# Patient Record
Sex: Female | Born: 1945 | Race: White | Hispanic: No | Marital: Married | Smoking: Never smoker
Health system: Southern US, Community
[De-identification: ages and names within clinical notes are randomized; demographics above are authoritative.]

## PROBLEM LIST (undated history)

## (undated) DIAGNOSIS — N6019 Diffuse cystic mastopathy of unspecified breast: Secondary | ICD-10-CM

## (undated) DIAGNOSIS — Z8701 Personal history of pneumonia (recurrent): Secondary | ICD-10-CM

## (undated) DIAGNOSIS — K589 Irritable bowel syndrome without diarrhea: Secondary | ICD-10-CM

## (undated) DIAGNOSIS — G43009 Migraine without aura, not intractable, without status migrainosus: Secondary | ICD-10-CM

## (undated) DIAGNOSIS — I428 Other cardiomyopathies: Secondary | ICD-10-CM

## (undated) DIAGNOSIS — J387 Other diseases of larynx: Secondary | ICD-10-CM

## (undated) DIAGNOSIS — T7840XA Allergy, unspecified, initial encounter: Secondary | ICD-10-CM

## (undated) DIAGNOSIS — I700 Atherosclerosis of aorta: Secondary | ICD-10-CM

## (undated) DIAGNOSIS — M797 Fibromyalgia: Secondary | ICD-10-CM

## (undated) DIAGNOSIS — J383 Other diseases of vocal cords: Secondary | ICD-10-CM

## (undated) DIAGNOSIS — K5740 Diverticulitis of both small and large intestine with perforation and abscess without bleeding: Secondary | ICD-10-CM

## (undated) DIAGNOSIS — I509 Heart failure, unspecified: Secondary | ICD-10-CM

## (undated) DIAGNOSIS — R112 Nausea with vomiting, unspecified: Secondary | ICD-10-CM

## (undated) DIAGNOSIS — J189 Pneumonia, unspecified organism: Secondary | ICD-10-CM

## (undated) DIAGNOSIS — Z8719 Personal history of other diseases of the digestive system: Secondary | ICD-10-CM

## (undated) DIAGNOSIS — R55 Syncope and collapse: Principal | ICD-10-CM

## (undated) DIAGNOSIS — D472 Monoclonal gammopathy: Secondary | ICD-10-CM

## (undated) DIAGNOSIS — R197 Diarrhea, unspecified: Secondary | ICD-10-CM

## (undated) DIAGNOSIS — Z95810 Presence of automatic (implantable) cardiac defibrillator: Secondary | ICD-10-CM

## (undated) DIAGNOSIS — K649 Unspecified hemorrhoids: Secondary | ICD-10-CM

## (undated) DIAGNOSIS — F419 Anxiety disorder, unspecified: Secondary | ICD-10-CM

## (undated) DIAGNOSIS — I340 Nonrheumatic mitral (valve) insufficiency: Secondary | ICD-10-CM

## (undated) DIAGNOSIS — M1990 Unspecified osteoarthritis, unspecified site: Secondary | ICD-10-CM

## (undated) DIAGNOSIS — F329 Major depressive disorder, single episode, unspecified: Secondary | ICD-10-CM

## (undated) DIAGNOSIS — I5020 Unspecified systolic (congestive) heart failure: Secondary | ICD-10-CM

## (undated) DIAGNOSIS — J449 Chronic obstructive pulmonary disease, unspecified: Secondary | ICD-10-CM

## (undated) DIAGNOSIS — Z8489 Family history of other specified conditions: Secondary | ICD-10-CM

## (undated) DIAGNOSIS — F32A Depression, unspecified: Secondary | ICD-10-CM

## (undated) DIAGNOSIS — D649 Anemia, unspecified: Secondary | ICD-10-CM

## (undated) DIAGNOSIS — J329 Chronic sinusitis, unspecified: Secondary | ICD-10-CM

## (undated) DIAGNOSIS — I10 Essential (primary) hypertension: Secondary | ICD-10-CM

## (undated) DIAGNOSIS — J984 Other disorders of lung: Secondary | ICD-10-CM

## (undated) DIAGNOSIS — G629 Polyneuropathy, unspecified: Secondary | ICD-10-CM

## (undated) DIAGNOSIS — G4730 Sleep apnea, unspecified: Secondary | ICD-10-CM

## (undated) DIAGNOSIS — G4700 Insomnia, unspecified: Secondary | ICD-10-CM

## (undated) DIAGNOSIS — Z9189 Other specified personal risk factors, not elsewhere classified: Secondary | ICD-10-CM

## (undated) DIAGNOSIS — K219 Gastro-esophageal reflux disease without esophagitis: Secondary | ICD-10-CM

## (undated) DIAGNOSIS — Z9889 Other specified postprocedural states: Secondary | ICD-10-CM

## (undated) DIAGNOSIS — M810 Age-related osteoporosis without current pathological fracture: Secondary | ICD-10-CM

## (undated) DIAGNOSIS — R0600 Dyspnea, unspecified: Secondary | ICD-10-CM

## (undated) DIAGNOSIS — C801 Malignant (primary) neoplasm, unspecified: Secondary | ICD-10-CM

## (undated) HISTORY — DX: Migraine without aura, not intractable, without status migrainosus: G43.009

## (undated) HISTORY — DX: Chronic sinusitis, unspecified: J32.9

## (undated) HISTORY — PX: APPENDECTOMY: SHX54

## (undated) HISTORY — DX: Diarrhea, unspecified: R19.7

## (undated) HISTORY — PX: TOE AMPUTATION: SHX809

## (undated) HISTORY — PX: AUGMENTATION MAMMAPLASTY: SUR837

## (undated) HISTORY — DX: Other diseases of larynx: J38.7

## (undated) HISTORY — DX: Allergy, unspecified, initial encounter: T78.40XA

## (undated) HISTORY — PX: OTHER SURGICAL HISTORY: SHX169

## (undated) HISTORY — DX: Atherosclerosis of aorta: I70.0

## (undated) HISTORY — PX: OSTEOTOMY: SHX137

## (undated) HISTORY — DX: Anxiety disorder, unspecified: F41.9

## (undated) HISTORY — DX: Other diseases of vocal cords: J38.3

## (undated) HISTORY — DX: Diffuse cystic mastopathy of unspecified breast: N60.19

## (undated) HISTORY — DX: Gastro-esophageal reflux disease without esophagitis: K21.9

## (undated) HISTORY — DX: Personal history of pneumonia (recurrent): Z87.01

## (undated) HISTORY — DX: Monoclonal gammopathy: D47.2

## (undated) HISTORY — PX: MANDIBLE SURGERY: SHX707

## (undated) HISTORY — DX: Essential (primary) hypertension: I10

## (undated) HISTORY — DX: Irritable bowel syndrome, unspecified: K58.9

## (undated) HISTORY — DX: Other cardiomyopathies: I42.8

## (undated) HISTORY — PX: LASIK: SHX215

## (undated) HISTORY — DX: Unspecified hemorrhoids: K64.9

## (undated) HISTORY — DX: Fibromyalgia: M79.7

## (undated) HISTORY — DX: Pneumonia, unspecified organism: J18.9

## (undated) HISTORY — DX: Age-related osteoporosis without current pathological fracture: M81.0

## (undated) HISTORY — DX: Syncope and collapse: R55

## (undated) HISTORY — DX: Insomnia, unspecified: G47.00

## (undated) HISTORY — PX: KNEE ARTHROSCOPY W/ ORIF: SUR98

## (undated) HISTORY — DX: Chronic obstructive pulmonary disease, unspecified: J44.9

## (undated) HISTORY — DX: Personal history of other diseases of the digestive system: Z87.19

## (undated) HISTORY — DX: Unspecified systolic (congestive) heart failure: I50.20

## (undated) HISTORY — DX: Nonrheumatic mitral (valve) insufficiency: I34.0

## (undated) HISTORY — DX: Unspecified osteoarthritis, unspecified site: M19.90

## (undated) HISTORY — PX: TONSILLECTOMY AND ADENOIDECTOMY: SUR1326

---

## 1985-05-15 HISTORY — PX: ABDOMINAL HYSTERECTOMY: SHX81

## 1998-08-13 ENCOUNTER — Other Ambulatory Visit: Admission: RE | Admit: 1998-08-13 | Discharge: 1998-08-13 | Payer: Self-pay | Admitting: *Deleted

## 1999-03-28 ENCOUNTER — Encounter: Payer: Self-pay | Admitting: Family Medicine

## 1999-03-28 ENCOUNTER — Encounter: Admission: RE | Admit: 1999-03-28 | Discharge: 1999-03-28 | Payer: Self-pay | Admitting: Family Medicine

## 1999-08-04 ENCOUNTER — Other Ambulatory Visit: Admission: RE | Admit: 1999-08-04 | Discharge: 1999-08-04 | Payer: Self-pay | Admitting: Obstetrics and Gynecology

## 2000-10-23 ENCOUNTER — Encounter: Admission: RE | Admit: 2000-10-23 | Discharge: 2000-10-23 | Payer: Self-pay | Admitting: Internal Medicine

## 2001-08-13 ENCOUNTER — Other Ambulatory Visit: Admission: RE | Admit: 2001-08-13 | Discharge: 2001-08-13 | Payer: Self-pay | Admitting: Obstetrics and Gynecology

## 2001-10-16 HISTORY — PX: OTHER SURGICAL HISTORY: SHX169

## 2001-11-21 ENCOUNTER — Ambulatory Visit (HOSPITAL_COMMUNITY): Admission: RE | Admit: 2001-11-21 | Discharge: 2001-11-21 | Payer: Self-pay | Admitting: Family Medicine

## 2001-11-21 ENCOUNTER — Encounter: Payer: Self-pay | Admitting: Family Medicine

## 2004-05-12 ENCOUNTER — Emergency Department: Payer: Self-pay | Admitting: Emergency Medicine

## 2004-05-19 ENCOUNTER — Ambulatory Visit: Payer: Self-pay | Admitting: Internal Medicine

## 2004-05-20 ENCOUNTER — Ambulatory Visit: Payer: Self-pay | Admitting: Surgery

## 2004-07-20 ENCOUNTER — Ambulatory Visit: Payer: Self-pay | Admitting: Family Medicine

## 2004-07-25 ENCOUNTER — Ambulatory Visit: Payer: Self-pay | Admitting: Family Medicine

## 2004-09-10 ENCOUNTER — Emergency Department: Payer: Self-pay | Admitting: Internal Medicine

## 2004-09-14 ENCOUNTER — Ambulatory Visit: Payer: Self-pay | Admitting: Family Medicine

## 2005-02-20 ENCOUNTER — Ambulatory Visit: Payer: Self-pay | Admitting: Family Medicine

## 2005-02-27 ENCOUNTER — Ambulatory Visit: Payer: Self-pay | Admitting: Family Medicine

## 2005-03-20 ENCOUNTER — Ambulatory Visit: Payer: Self-pay | Admitting: Internal Medicine

## 2005-03-22 ENCOUNTER — Ambulatory Visit: Payer: Self-pay | Admitting: Internal Medicine

## 2005-06-20 ENCOUNTER — Ambulatory Visit: Payer: Self-pay | Admitting: Family Medicine

## 2005-07-04 ENCOUNTER — Ambulatory Visit: Payer: Self-pay | Admitting: Internal Medicine

## 2005-08-08 ENCOUNTER — Ambulatory Visit: Payer: Self-pay | Admitting: General Practice

## 2005-09-11 ENCOUNTER — Ambulatory Visit: Payer: Self-pay | Admitting: Family Medicine

## 2005-09-22 ENCOUNTER — Ambulatory Visit: Payer: Self-pay | Admitting: Family Medicine

## 2005-11-12 ENCOUNTER — Encounter: Payer: Self-pay | Admitting: Family Medicine

## 2005-11-12 LAB — CONVERTED CEMR LAB: Pap Smear: NORMAL

## 2005-11-16 ENCOUNTER — Ambulatory Visit: Payer: Self-pay | Admitting: Family Medicine

## 2005-11-30 ENCOUNTER — Ambulatory Visit: Payer: Self-pay | Admitting: Family Medicine

## 2005-12-01 ENCOUNTER — Ambulatory Visit: Payer: Self-pay | Admitting: Family Medicine

## 2006-01-17 ENCOUNTER — Ambulatory Visit: Payer: Self-pay | Admitting: Family Medicine

## 2006-02-26 ENCOUNTER — Ambulatory Visit: Payer: Self-pay | Admitting: Family Medicine

## 2006-03-06 ENCOUNTER — Ambulatory Visit: Payer: Self-pay | Admitting: Family Medicine

## 2006-03-28 ENCOUNTER — Ambulatory Visit: Payer: Self-pay | Admitting: Family Medicine

## 2006-06-11 HISTORY — PX: OTHER SURGICAL HISTORY: SHX169

## 2006-11-22 ENCOUNTER — Ambulatory Visit: Payer: Self-pay | Admitting: Family Medicine

## 2006-11-22 DIAGNOSIS — R109 Unspecified abdominal pain: Secondary | ICD-10-CM | POA: Insufficient documentation

## 2006-11-22 DIAGNOSIS — M1990 Unspecified osteoarthritis, unspecified site: Secondary | ICD-10-CM | POA: Insufficient documentation

## 2006-11-22 DIAGNOSIS — E785 Hyperlipidemia, unspecified: Secondary | ICD-10-CM

## 2006-11-22 DIAGNOSIS — M797 Fibromyalgia: Secondary | ICD-10-CM | POA: Insufficient documentation

## 2006-11-22 DIAGNOSIS — K589 Irritable bowel syndrome without diarrhea: Secondary | ICD-10-CM | POA: Insufficient documentation

## 2006-11-22 DIAGNOSIS — G43009 Migraine without aura, not intractable, without status migrainosus: Secondary | ICD-10-CM | POA: Insufficient documentation

## 2006-11-22 DIAGNOSIS — N6019 Diffuse cystic mastopathy of unspecified breast: Secondary | ICD-10-CM | POA: Insufficient documentation

## 2006-11-26 ENCOUNTER — Ambulatory Visit: Payer: Self-pay | Admitting: Family Medicine

## 2006-12-18 ENCOUNTER — Encounter: Payer: Self-pay | Admitting: Family Medicine

## 2007-02-22 ENCOUNTER — Encounter: Payer: Self-pay | Admitting: Family Medicine

## 2007-04-30 ENCOUNTER — Encounter: Payer: Self-pay | Admitting: Family Medicine

## 2007-06-10 ENCOUNTER — Telehealth (INDEPENDENT_AMBULATORY_CARE_PROVIDER_SITE_OTHER): Payer: Self-pay | Admitting: *Deleted

## 2007-08-05 ENCOUNTER — Telehealth: Payer: Self-pay | Admitting: Family Medicine

## 2007-08-06 ENCOUNTER — Ambulatory Visit: Payer: Self-pay | Admitting: Family Medicine

## 2007-08-06 ENCOUNTER — Encounter (INDEPENDENT_AMBULATORY_CARE_PROVIDER_SITE_OTHER): Payer: Self-pay | Admitting: Internal Medicine

## 2007-08-12 ENCOUNTER — Ambulatory Visit: Payer: Self-pay | Admitting: Family Medicine

## 2007-08-12 DIAGNOSIS — N63 Unspecified lump in unspecified breast: Secondary | ICD-10-CM | POA: Insufficient documentation

## 2007-08-19 ENCOUNTER — Encounter (INDEPENDENT_AMBULATORY_CARE_PROVIDER_SITE_OTHER): Payer: Self-pay | Admitting: Internal Medicine

## 2007-08-19 LAB — CONVERTED CEMR LAB
Cholesterol: 160 mg/dL (ref 0–200)
HDL: 45.8 mg/dL (ref >39.0)
LDL Cholesterol: 88 mg/dL (ref 0–99)
Total CHOL/HDL Ratio: 3.5
Triglycerides: 129 mg/dL (ref 0–149)
VLDL: 26 mg/dL (ref 0–40)

## 2007-08-28 ENCOUNTER — Telehealth (INDEPENDENT_AMBULATORY_CARE_PROVIDER_SITE_OTHER): Payer: Self-pay | Admitting: Internal Medicine

## 2007-08-29 ENCOUNTER — Encounter (INDEPENDENT_AMBULATORY_CARE_PROVIDER_SITE_OTHER): Payer: Self-pay | Admitting: *Deleted

## 2007-08-29 ENCOUNTER — Encounter (INDEPENDENT_AMBULATORY_CARE_PROVIDER_SITE_OTHER): Payer: Self-pay | Admitting: Internal Medicine

## 2007-11-28 ENCOUNTER — Encounter (INDEPENDENT_AMBULATORY_CARE_PROVIDER_SITE_OTHER): Payer: Self-pay | Admitting: Internal Medicine

## 2007-12-04 ENCOUNTER — Encounter: Payer: Self-pay | Admitting: Family Medicine

## 2008-02-21 ENCOUNTER — Ambulatory Visit: Payer: Self-pay | Admitting: Family Medicine

## 2008-02-26 LAB — CONVERTED CEMR LAB
ALT: 28 units/L (ref 0–35)
AST: 27 units/L (ref 0–37)
Albumin: 3.9 g/dL (ref 3.5–5.2)
Alkaline Phosphatase: 89 units/L (ref 39–117)
Bilirubin, Direct: 0.1 mg/dL (ref 0.0–0.3)
Cholesterol: 153 mg/dL (ref 0–200)
HDL: 49.1 mg/dL (ref >39.0)
LDL Cholesterol: 84 mg/dL (ref 0–99)
Total Bilirubin: 0.7 mg/dL (ref 0.3–1.2)
Total CHOL/HDL Ratio: 3.1
Total Protein: 7.2 g/dL (ref 6.0–8.3)
Triglycerides: 98 mg/dL (ref 0–149)
VLDL: 20 mg/dL (ref 0–40)

## 2008-04-17 ENCOUNTER — Encounter: Payer: Self-pay | Admitting: Otolaryngology

## 2008-05-15 DIAGNOSIS — G4730 Sleep apnea, unspecified: Secondary | ICD-10-CM

## 2008-05-15 HISTORY — DX: Sleep apnea, unspecified: G47.30

## 2008-07-13 ENCOUNTER — Telehealth: Payer: Self-pay | Admitting: Internal Medicine

## 2008-07-14 ENCOUNTER — Ambulatory Visit: Payer: Self-pay | Admitting: Family Medicine

## 2008-07-15 ENCOUNTER — Encounter: Payer: Self-pay | Admitting: Family Medicine

## 2008-07-15 LAB — CONVERTED CEMR LAB
ALT: 41 units/L — ABNORMAL HIGH (ref 0–35)
AST: 42 units/L — ABNORMAL HIGH (ref 0–37)
Albumin: 3.5 g/dL (ref 3.5–5.2)
Alkaline Phosphatase: 97 units/L (ref 39–117)
BUN: 13 mg/dL (ref 6–23)
Bilirubin, Direct: 0.1 mg/dL (ref 0.0–0.3)
CO2: 28 meq/L (ref 19–32)
Calcium: 9.3 mg/dL (ref 8.4–10.5)
Chloride: 101 meq/L (ref 96–112)
Creatinine, Ser: 0.9 mg/dL (ref 0.4–1.2)
GFR calc Af Amer: 82 mL/min
GFR calc non Af Amer: 67 mL/min
Glucose, Bld: 98 mg/dL (ref 70–99)
HCT: 39.9 % (ref 36.0–46.0)
Hemoglobin: 13.6 g/dL (ref 12.0–15.0)
MCHC: 34.1 g/dL (ref 30.0–36.0)
MCV: 88.8 fL (ref 78.0–100.0)
Platelets: 143 10*3/uL — ABNORMAL LOW (ref 150–400)
Potassium: 4.2 meq/L (ref 3.5–5.1)
RBC: 4.49 M/uL (ref 3.87–5.11)
RDW: 12.3 % (ref 11.5–14.6)
Sodium: 138 meq/L (ref 135–145)
Total Bilirubin: 0.6 mg/dL (ref 0.3–1.2)
Total Protein: 6.9 g/dL (ref 6.0–8.3)
WBC: 5.1 10*3/uL (ref 4.5–10.5)

## 2008-07-17 ENCOUNTER — Encounter (INDEPENDENT_AMBULATORY_CARE_PROVIDER_SITE_OTHER): Payer: Self-pay | Admitting: *Deleted

## 2008-07-17 ENCOUNTER — Telehealth: Payer: Self-pay | Admitting: Family Medicine

## 2008-11-04 ENCOUNTER — Encounter: Payer: Self-pay | Admitting: Family Medicine

## 2008-11-04 ENCOUNTER — Ambulatory Visit (HOSPITAL_BASED_OUTPATIENT_CLINIC_OR_DEPARTMENT_OTHER): Admission: RE | Admit: 2008-11-04 | Discharge: 2008-11-04 | Payer: Self-pay | Admitting: Orthopedic Surgery

## 2008-11-04 HISTORY — PX: OTHER SURGICAL HISTORY: SHX169

## 2008-11-19 ENCOUNTER — Encounter: Payer: Self-pay | Admitting: Family Medicine

## 2008-11-25 LAB — CONVERTED CEMR LAB: Pap Smear: NORMAL

## 2008-12-21 ENCOUNTER — Encounter: Payer: Self-pay | Admitting: Family Medicine

## 2008-12-22 ENCOUNTER — Encounter: Payer: Self-pay | Admitting: Family Medicine

## 2008-12-24 ENCOUNTER — Ambulatory Visit: Payer: Self-pay | Admitting: Family Medicine

## 2008-12-24 LAB — CONVERTED CEMR LAB
ALT: 22 units/L (ref 0–35)
AST: 25 units/L (ref 0–37)
Albumin: 4.2 g/dL (ref 3.5–5.2)
Alkaline Phosphatase: 99 units/L (ref 39–117)
BUN: 12 mg/dL (ref 6–23)
Basophils Absolute: 0 10*3/uL (ref 0.0–0.1)
Basophils Relative: 0.2 % (ref 0.0–3.0)
Bilirubin, Direct: 0.1 mg/dL (ref 0.0–0.3)
CO2: 32 meq/L (ref 19–32)
Calcium: 9.9 mg/dL (ref 8.4–10.5)
Chloride: 107 meq/L (ref 96–112)
Cholesterol: 163 mg/dL (ref 0–200)
Creatinine, Ser: 0.7 mg/dL (ref 0.4–1.2)
Eosinophils Absolute: 0.1 10*3/uL (ref 0.0–0.7)
Eosinophils Relative: 2 % (ref 0.0–5.0)
GFR calc non Af Amer: 89.7 mL/min (ref >60)
Glucose, Bld: 90 mg/dL (ref 70–99)
HCT: 39.7 % (ref 36.0–46.0)
HDL: 52.3 mg/dL (ref >39.00)
Hemoglobin: 13.5 g/dL (ref 12.0–15.0)
LDL Cholesterol: 90 mg/dL (ref 0–99)
Lymphocytes Relative: 33.4 % (ref 12.0–46.0)
Lymphs Abs: 2.3 10*3/uL (ref 0.7–4.0)
MCHC: 33.9 g/dL (ref 30.0–36.0)
MCV: 89.9 fL (ref 78.0–100.0)
Magnesium: 2.2 mg/dL (ref 1.5–2.5)
Monocytes Absolute: 0.7 10*3/uL (ref 0.1–1.0)
Monocytes Relative: 9.5 % (ref 3.0–12.0)
Neutro Abs: 3.9 10*3/uL (ref 1.4–7.7)
Neutrophils Relative %: 54.9 % (ref 43.0–77.0)
Platelets: 198 10*3/uL (ref 150.0–400.0)
Potassium: 4.2 meq/L (ref 3.5–5.1)
RBC: 4.41 M/uL (ref 3.87–5.11)
RDW: 11.7 % (ref 11.5–14.6)
Sodium: 145 meq/L (ref 135–145)
Total Bilirubin: 0.8 mg/dL (ref 0.3–1.2)
Total CHOL/HDL Ratio: 3
Total Protein: 7.5 g/dL (ref 6.0–8.3)
Triglycerides: 106 mg/dL (ref 0.0–149.0)
VLDL: 21.2 mg/dL (ref 0.0–40.0)
WBC: 7 10*3/uL (ref 4.5–10.5)

## 2008-12-25 LAB — CONVERTED CEMR LAB: Vit D, 25-Hydroxy: 42 ng/mL (ref 30–89)

## 2008-12-31 ENCOUNTER — Ambulatory Visit: Payer: Self-pay | Admitting: Family Medicine

## 2009-01-21 ENCOUNTER — Encounter: Payer: Self-pay | Admitting: Family Medicine

## 2009-03-03 ENCOUNTER — Ambulatory Visit: Payer: Self-pay | Admitting: Family Medicine

## 2009-03-03 LAB — CONVERTED CEMR LAB
OCCULT 1: NEGATIVE
OCCULT 2: NEGATIVE
OCCULT 3: NEGATIVE

## 2009-04-22 ENCOUNTER — Encounter: Payer: Self-pay | Admitting: Family Medicine

## 2009-06-24 ENCOUNTER — Encounter: Payer: Self-pay | Admitting: Family Medicine

## 2009-06-24 LAB — HM MAMMOGRAPHY: HM Mammogram: NORMAL

## 2009-06-28 ENCOUNTER — Encounter (INDEPENDENT_AMBULATORY_CARE_PROVIDER_SITE_OTHER): Payer: Self-pay | Admitting: *Deleted

## 2009-07-01 ENCOUNTER — Ambulatory Visit: Payer: Self-pay | Admitting: Specialist

## 2009-08-05 ENCOUNTER — Ambulatory Visit: Payer: Self-pay | Admitting: Specialist

## 2009-08-05 LAB — PULMONARY FUNCTION TEST

## 2009-12-11 ENCOUNTER — Encounter: Payer: Self-pay | Admitting: Family Medicine

## 2009-12-21 ENCOUNTER — Encounter (INDEPENDENT_AMBULATORY_CARE_PROVIDER_SITE_OTHER): Payer: Self-pay | Admitting: *Deleted

## 2009-12-22 ENCOUNTER — Encounter: Payer: Self-pay | Admitting: Family Medicine

## 2009-12-24 ENCOUNTER — Encounter: Payer: Self-pay | Admitting: Family Medicine

## 2009-12-26 ENCOUNTER — Encounter: Payer: Self-pay | Admitting: Family Medicine

## 2009-12-29 ENCOUNTER — Telehealth (INDEPENDENT_AMBULATORY_CARE_PROVIDER_SITE_OTHER): Payer: Self-pay | Admitting: *Deleted

## 2009-12-30 ENCOUNTER — Ambulatory Visit: Payer: Self-pay | Admitting: Internal Medicine

## 2009-12-30 LAB — CONVERTED CEMR LAB
ALT: 30 units/L (ref 0–35)
AST: 28 units/L (ref 0–37)
Albumin: 4.2 g/dL (ref 3.5–5.2)
Alkaline Phosphatase: 88 units/L (ref 39–117)
BUN: 16 mg/dL (ref 6–23)
Bilirubin, Direct: 0.1 mg/dL (ref 0.0–0.3)
CO2: 30 meq/L (ref 19–32)
Calcium: 9.8 mg/dL (ref 8.4–10.5)
Chloride: 106 meq/L (ref 96–112)
Cholesterol: 181 mg/dL (ref 0–200)
Creatinine, Ser: 0.8 mg/dL (ref 0.4–1.2)
GFR calc non Af Amer: 81.32 mL/min (ref >60)
Glucose, Bld: 88 mg/dL (ref 70–99)
HDL: 53.1 mg/dL (ref >39.00)
LDL Cholesterol: 108 mg/dL — ABNORMAL HIGH (ref 0–99)
Potassium: 4.7 meq/L (ref 3.5–5.1)
Sodium: 144 meq/L (ref 135–145)
Total Bilirubin: 0.6 mg/dL (ref 0.3–1.2)
Total CHOL/HDL Ratio: 3
Total Protein: 7.1 g/dL (ref 6.0–8.3)
Triglycerides: 100 mg/dL (ref 0.0–149.0)
VLDL: 20 mg/dL (ref 0.0–40.0)

## 2010-01-06 ENCOUNTER — Encounter: Payer: Self-pay | Admitting: Family Medicine

## 2010-01-06 ENCOUNTER — Ambulatory Visit: Payer: Self-pay | Admitting: Internal Medicine

## 2010-01-13 HISTORY — PX: BREAST MASS EXCISION: SHX1267

## 2010-01-20 ENCOUNTER — Encounter: Payer: Self-pay | Admitting: Family Medicine

## 2010-02-10 ENCOUNTER — Telehealth: Payer: Self-pay | Admitting: Family Medicine

## 2010-02-23 ENCOUNTER — Encounter: Payer: Self-pay | Admitting: Family Medicine

## 2010-02-23 ENCOUNTER — Ambulatory Visit (HOSPITAL_BASED_OUTPATIENT_CLINIC_OR_DEPARTMENT_OTHER): Admission: RE | Admit: 2010-02-23 | Discharge: 2010-02-23 | Payer: Self-pay | Admitting: General Surgery

## 2010-03-10 ENCOUNTER — Encounter: Payer: Self-pay | Admitting: Family Medicine

## 2010-03-29 ENCOUNTER — Ambulatory Visit: Payer: Self-pay | Admitting: Family Medicine

## 2010-03-29 LAB — FECAL OCCULT BLOOD, GUAIAC: Fecal Occult Blood: NEGATIVE

## 2010-03-31 ENCOUNTER — Encounter (INDEPENDENT_AMBULATORY_CARE_PROVIDER_SITE_OTHER): Payer: Self-pay | Admitting: *Deleted

## 2010-03-31 ENCOUNTER — Encounter: Payer: Self-pay | Admitting: Family Medicine

## 2010-03-31 LAB — CONVERTED CEMR LAB: Fecal Occult Bld: NEGATIVE

## 2010-05-15 DIAGNOSIS — Z95810 Presence of automatic (implantable) cardiac defibrillator: Secondary | ICD-10-CM

## 2010-05-15 DIAGNOSIS — Z8701 Personal history of pneumonia (recurrent): Secondary | ICD-10-CM

## 2010-05-15 HISTORY — DX: Presence of automatic (implantable) cardiac defibrillator: Z95.810

## 2010-05-15 HISTORY — DX: Personal history of pneumonia (recurrent): Z87.01

## 2010-06-06 ENCOUNTER — Telehealth: Payer: Self-pay | Admitting: Family Medicine

## 2010-06-12 LAB — CONVERTED CEMR LAB
ALT: 29 units/L (ref 0–35)
AST: 25 units/L (ref 0–37)
Albumin: 3.9 g/dL (ref 3.5–5.2)
Alkaline Phosphatase: 104 units/L (ref 39–117)
BUN: 14 mg/dL (ref 6–23)
Basophils Absolute: 0.1 10*3/uL (ref 0.0–0.1)
Basophils Relative: 1 % (ref 0.0–1.0)
Bilirubin, Direct: 0.1 mg/dL (ref 0.0–0.3)
CO2: 30 meq/L (ref 19–32)
Calcium: 9.6 mg/dL (ref 8.4–10.5)
Chloride: 100 meq/L (ref 96–112)
Cholesterol: 220 mg/dL (ref 0–200)
Creatinine, Ser: 0.8 mg/dL (ref 0.4–1.2)
Direct LDL: 147.2 mg/dL
Eosinophils Absolute: 0.1 10*3/uL (ref 0.0–0.6)
Eosinophils Relative: 1.5 % (ref 0.0–5.0)
GFR calc Af Amer: 94 mL/min
GFR calc non Af Amer: 78 mL/min
Glucose, Bld: 97 mg/dL (ref 70–99)
HCT: 37.8 % (ref 36.0–46.0)
HDL: 50.2 mg/dL (ref >39.0)
Hemoglobin: 13.2 g/dL (ref 12.0–15.0)
Lymphocytes Relative: 29.6 % (ref 12.0–46.0)
MCHC: 34.9 g/dL (ref 30.0–36.0)
MCV: 88.3 fL (ref 78.0–100.0)
Monocytes Absolute: 0.7 10*3/uL (ref 0.2–0.7)
Monocytes Relative: 8.5 % (ref 3.0–11.0)
Neutro Abs: 5.2 10*3/uL (ref 1.4–7.7)
Neutrophils Relative %: 59.4 % (ref 43.0–77.0)
Platelets: 248 10*3/uL (ref 150–400)
Potassium: 3.7 meq/L (ref 3.5–5.1)
RBC: 4.28 M/uL (ref 3.87–5.11)
RDW: 11.8 % (ref 11.5–14.6)
Sodium: 140 meq/L (ref 135–145)
TSH: 1.34 microintl units/mL (ref 0.35–5.50)
Total Bilirubin: 0.7 mg/dL (ref 0.3–1.2)
Total CHOL/HDL Ratio: 4.4
Total Protein: 7.2 g/dL (ref 6.0–8.3)
Triglycerides: 168 mg/dL — ABNORMAL HIGH (ref 0–149)
VLDL: 34 mg/dL (ref 0–40)
WBC: 8.7 10*3/uL (ref 4.5–10.5)

## 2010-06-14 NOTE — Progress Notes (Signed)
Summary: Pt is on Lipitor  Phone Note Call from Patient Call back at [phone]   Caller: Patient Call For: Everrett Coombe, FNP Summary of Call: Pt just received her lab results from the phone tree and she was confused because the message stated that she was not on meds.  Pt wanted to let you know that she has been on Lipitor 10 mg daily for a year. Initial call taken by: Sydell Axon,  August 28, 2007 2:12 PM  Follow-up for Phone Call        please call her, I missed the lipitor on the med sheet.  she is to continue taking it as it is doing a great job.  needs fasting lipids and LTFs in 65mo to monitor.   ..................................................................Marland KitchenBillie-Lynn Tyler Deis FNP  August 29, 2007 6:54 AM   Additional Follow-up for Phone Call Additional follow up Details #1::        msg left on home ans mach w/ instructions.l.  ..................................................................Marland KitchenCooper Render  August 29, 2007 12:46 PM

## 2010-06-14 NOTE — Consult Note (Signed)
Summary: Consultation Report  Consultation Report   Imported By: Eleonore Chiquito 12/21/2006 07:44:15  _____________________________________________________________________  External Attachment:    Type:   Image     Comment:   External Document

## 2010-06-14 NOTE — Assessment & Plan Note (Signed)
Summary: CPX/CLE   Vital Signs:  Patient profile:   65 year old female Height:      64.75 inches Weight:      157 pounds Temp:     97.9 degrees F oral Pulse rate:   80 / minute Pulse rhythm:   regular BP sitting:   122 / 78  (right arm) Cuff size:   regular  Vitals Entered By: Lowella Petties CMA (December 31, 2008 9:33 AM) CC: CPX, Preventive Care   History of Present Illness: Pt is hacking and coughing and told she has reflux causing esophageal and pharyngeal inflammation with sneezing. She takes Omeprazole OTC, hasn't been taking anything for the last two months due to mother's kneee surgery and anxiety and is now in [place] for dementia and anxiety for PT and OT. The pt says she has been too busy to take the meds appropriately. She wants Ambien for sleep. She wants the lowest dose and uses 1/2 when needed, typically a couple nites a week. She has a chronic knot which is sore on the proximal  left upper arm. LAst Pap last month with mammo then, repeat left breast exam.  Preventive Screening-Counseling & Management  Alcohol-Tobacco     Alcohol drinks/day: 0     Smoking Status: never     Passive Smoke Exposure: no  Caffeine-Diet-Exercise     Caffeine use/day: 3     Does Patient Exercise: no     Type of exercise: keeps 4 and 65 yr old.  Problems Prior to Update: 1)  Diarrhea  (ICD-787.91) 2)  Breast Mass, Left  (ICD-611.72) 3)  Disturbance of Skin Sensation  (ICD-782.0) 4)  Chest Pain  (ICD-786.50) 5)  Uri  (ICD-465.9) 6)  Health Maintenance Exam  (ICD-V70.0) 7)  Asthmatic Bronchitis, Acute  (ICD-466.0) 8)  Irritable Bowel Syndrome  (ICD-564.1) 9)  Carpal Tunnel Syndrome  (ICD-354.0) 10)  Common Migraine  (ICD-346.10) 11)  Abdominal Pain  (ICD-789.00) 12)  Genital Herpes  (ICD-054.10) 13)  Fibrocystic Breast Disease  (ICD-610.1) 14)  Fibromyalgia  (ICD-729.1) 15)  Osteoarthritis  (ICD-715.90) 16)  Diverticulosis, Colon  (ICD-562.10) 17)  Hypercholesterolemia, Pure   (ICD-272.0)  Medications Prior to Update: 1)  Aleve 220 Mg  Tabs (Naproxen Sodium) .... Two Times A Day As Needed. 2)  Lipitor 10 Mg  Tabs (Atorvastatin Calcium) .... One Tab By Mouth At Night 3)  Tylenol 325 Mg  Tabs (Acetaminophen) .... As Needed 4)  Accuneb 0.63 Mg/62ml  Nebu (Albuterol Sulfate) .... Use As Needed For Asthma From Gerd 5)  Nasonex 50 Mcg/act  Susp (Mometasone Furoate) .Marland Kitchen.. 1 Spray Two Times A Day 6)  Adult Aspirin Ec Low Strength 81 Mg  Tbec (Aspirin) .... Take 1 Tablet By Mouth Once A Day 7)  Advair Diskus 250-50 Mcg/dose Misc (Fluticasone-Salmeterol) .... One Inhalation As Needed 8)  Zegerid 40-1100 Mg Caps (Omeprazole-Sodium Bicarbonate) .... Take 1 Tablet By Mouth Once A Day 9)  Promethazine Hcl 25 Mg Tabs (Promethazine Hcl) .Marland Kitchen.. 1 By Mouth Up To Three Times A Day As Needed Nausea  Allergies: 1)  ! Sulfasalazine (Sulfasalazine) 2)  ! Gnp Iodine (Iodine) 3)  ! Penicillin V Potassium (Penicillin V Potassium) 4)  ! * Benazine 5)  ! * Yeast 6)  ! * Dust 7)  ! Latex Gloves (Disposable Gloves) 8)  ! Phenergan 9)  ! * Bee Stings 10)  ! * Mold 11)  ! 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 H/A Focus Meryl Crutch) 10/16/01          EMG/MCV (+) mild carpal tunnel 03/12/03      DEXA - 2.2  Spine 0.1 Hip, osteopenia 12/12/01        MRI R hip Tendonitis Gluteus Medius to Gtr Trochanter Neg Bursitis(Dr. Montez Morita) 11/21/01        L/S films nml, R hip  nml. 11/21/01        MRI U/S nml, min. DDD L/3-4 01/04/04        Colonoscopy - polyps, diverticulosis/severe 11/14/05          DEXA - 1.9 spine   0.3 Hip  Slight improvement 06/11/06        Flex laryngoscopy (Juengel) nml. 11/22/06        DEXA improved osteo at fem neck 11/04/08        Chevron Bunbionectomy R Gt Toe (Dr Lestine Box)   Family History: Last updated: Jan 05, 2009 Father: Died 80 CA lung (smoker)      (00) Mother: Alive 28  dementia osteoporosis/lumbar spine, stroke x 5 @ 52 YOA, arthritis of hands, emphysema, valve regurg. forgetfulness Siblings: None CV:  (+) GF, (+) MGF palpitations and arrhythmia HBP:  ? MGF DM:  (-) Lung CA:  (+) Father ETOH:  PGF Strokes:  + x 5 in mother, ? 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 forgetfulness Siblings: None CV:  (+) GF, (+) MGF palpitations and arrhythmia HBP:  ? MGF DM:  (-) Lung CA:  (+) Father ETOH:  PGF Strokes:  + x 5 in mother, ? PGF  Social History: Caffeine use/day:  3  Review of Systems General:  Complains of chills and sweats; denies fatigue, fever, loss of appetite, malaise, sleep disorder, weakness, and weight loss; occas. Eyes:  Complains of blurring; denies discharge, double vision, eye irritation, eye pain, halos, itching, light sensitivity, red eye,  vision loss-1 eye, and vision loss-both eyes; wears one contact, seen [facility] at [facility]...has different  contacts now.. ENT:  Complains of earache and ringing in ears; denies decreased hearing, difficulty swallowing, ear discharge, hoarseness, nasal congestion, nosebleeds, postnasal drainage, sinus pressure, and sore throat; right occas. CV:  Denies bluish discoloration of lips or nails, chest pain or discomfort, difficulty breathing at night, difficulty breathing while lying down, fainting, fatigue, leg cramps with exertion, lightheadness, near fainting, palpitations, shortness of breath with exertion, swelling of feet, swelling of hands, and weight gain. Resp:  Complains of cough; denies chest discomfort, chest pain with inspiration, coughing up blood, excessive snoring, hypersomnolence, morning headaches, pleuritic, shortness of breath, sputum productive, and wheezing; with GERD, unchanged. GI:  Complains of constipation and diarrhea; denies abdominal pain, bloody stools, change in bowel habits, dark tarry stools, excessive appetite, gas, hemorrhoids, indigestion, loss of appetite, nausea, vomiting, vomiting blood, and yellowish skin color; alternates. GU:  Denies abnormal vaginal bleeding, decreased libido, discharge, dysuria, genital sores, hematuria, incontinence, nocturia, urinary frequency, and urinary hesitancy. MS:  Complains of joint pain; denies joint redness, joint swelling, loss of strength, low back pain, mid back pain, muscle aches, muscle , cramps, muscle weakness, stiffness, and thoracic pain; througout with osteoarthritis.. Derm:  Denies changes in color of skin, changes in nail beds, dryness, excessive perspiration, flushing, hair loss, insect bite(s), itching, lesion(s), poor wound healing, and rash; dry perineal area. To see Derm soon.. Neuro:  Complains of poor balance; denies brief paralysis, difficulty with concentration, disturbances in coordination, falling down, headaches, inability to speak, memory loss, numbness, seizures, sensation of room spinning, tingling, tremors, visual  disturbances, and weakness; off slightly.Marland Kitchen  Physical Exam  General:  Well-developed,well-nourished,in no acute distress; alert,appropriate and cooperative throughout examination Head:  Normocephalic and atraumatic without obvious abnormalities. No apparent alopecia or balding. Sinuses NT. Eyes:  Conjunctiva clear bilaterally.  Ears:  R ear normal and L ear normal.  Mild cerumen bilat. Nose:  External nasal examination shows no deformity or inflammation. Nasal mucosa are pink and moist without lesions or exudates. Mouth:  pharynx pink and moist, no erythema, and no exudates.   Neck:  No deformities, masses, or tenderness noted. Chest Wall:  No deformities, masses, or tenderness noted. Lungs:  harsh cough without production CTA with good air exch- no rales/rhonchi or wheeze  breathing is not labored  Heart:  normal rate, regular rhythm, no murmur, and no gallop.   Abdomen:  Bowel sounds positive,abdomen soft and non-tender without masses, organomegaly or hernias noted. Msk:  no acute joint changes  Pulses:  R radial normal and L radial normal.   Extremities:  no edema  Neurologic:  alert & oriented X3, sensation intact to light touch, and gait normal.  Slightl;y wobbly with getting onto table with minimal assistance. Skin:  Intact without suspicious lesions or rashes no pallor or jaundice  brisk capillary refil and nl turgor Cervical Nodes:  No lymphadenopathy noted Inguinal Nodes:  No significant adenopathy Psych:  nl affect, good interaction and eye contact.   Impression & Recommendations:  Problem # 1:  HEALTH MAINTENANCE EXAM (ICD-V70.0) Assessment Comment Only  Problem # 2:  CARPAL TUNNEL  R (ICD-354.0) Assessment: Improved  But still bothers her some.  Labs Reviewed: TSH: 1.34 (11/22/2006)     Problem # 3:  IRRITABLE BOWEL SYNDROME (ICD-564.1) Assessment: Unchanged Stable, constantly learning how to better control her sxs.  Problem # 4:  COMMON MIGRAINE  (ICD-346.10) Assessment: Improved Much less frequent over  the last year. The following medications were removed from the medication list:    Tylenol 325 Mg Tabs (Acetaminophen) .Marland Kitchen... As needed Her updated medication list for this problem includes:    Aleve 220 Mg Tabs (Naproxen sodium) .Marland Kitchen..Marland Kitchen Two times a day as needed.    Adult Aspirin Ec Low Strength 81 Mg Tbec (Aspirin) .Marland Kitchen... Take 1 tablet by mouth once a day  Problem # 5:  FIBROCYSTIC BREAST DISEASE (ICD-610.1) Mammo nml but required extra views showing what ias believed to be scar tissue.  Problem # 6:  FIBROMYALGIA (ICD-729.1) Assessment: Unchanged Stable, has learned how to live with it. The following medications were removed from the medication list:    Tylenol 325 Mg Tabs (Acetaminophen) .Marland Kitchen... As needed Her updated medication list for this problem includes:    Aleve 220 Mg Tabs (Naproxen sodium) .Marland Kitchen..Marland Kitchen Two times a day as needed.    Adult Aspirin Ec Low Strength 81 Mg Tbec (Aspirin) .Marland Kitchen... Take 1 tablet by mouth once a day  Problem # 7:  OSTEOARTHRITIS (ICD-715.90) Assessment: Unchanged Global but controlled reasonably. The following medications were removed from the medication list:    Tylenol 325 Mg Tabs (Acetaminophen) .Marland Kitchen... As needed Her updated medication list for this problem includes:    Aleve 220 Mg Tabs (Naproxen sodium) .Marland Kitchen..Marland Kitchen Two times a day as needed.    Adult Aspirin Ec Low Strength 81 Mg Tbec (Aspirin) .Marland Kitchen... Take 1 tablet by mouth once a day  Problem # 8:  DIVERTICULOSIS, COLON (ICD-562.10) Assessment: Unchanged  Discussed being aseen for LLQ pain longer than 48-72 hrs.  Labs Reviewed: Hgb: 13.5 (12/24/2008)   Hct: 39.7 (12/24/2008)   WBC: 7.0 (12/24/2008)  Problem # 9:  HYPERCHOLESTEROLEMIA, PURE (ICD-272.0) Assessment: Unchanged Good control. Continue. Her updated medication list for this problem includes:    Lipitor 10 Mg Tabs (Atorvastatin calcium) ..... One tab by mouth at night  Labs Reviewed: SGOT:  25 (12/24/2008)   SGPT: 22 (12/24/2008)   HDL:52.30 (12/24/2008), 49.1 (02/21/2008)  LDL:90 (12/24/2008), 84 (02/21/2008)  Chol:163 (12/24/2008), 153 (02/21/2008)  Trig:106.0 (12/24/2008), 98 (02/21/2008)  Complete Medication List: 1)  Aleve 220 Mg Tabs (Naproxen sodium) .... Two times a day as needed. 2)  Lipitor 10 Mg Tabs (Atorvastatin calcium) .... One tab by mouth at night 3)  Nasonex 50 Mcg/act Susp (Mometasone furoate) .Marland Kitchen.. 1 spray two times a day 4)  Adult Aspirin Ec Low Strength 81 Mg Tbec (Aspirin) .... Take 1 tablet by mouth once a day 5)  Advair Diskus 250-50 Mcg/dose Misc (Fluticasone-salmeterol) .... One inhalation as needed 6)  Zegerid 40-1100 Mg Caps (Omeprazole-sodium bicarbonate) .... Take 1 tablet by mouth once a day 7)  Ambien 5 Mg Tabs (Zolpidem tartrate) .Marland Kitchen.. 1 tab by mouth at night as needed insomnia  PAP Screening:    Last PAP smear:  11/25/2008  PAP Smear Results:    Date of Exam:  11/25/2008    Results:  Normal, Satisfactory  Mammogram Screening:    Last Mammogram:  12/21/2008  Mammogram Results:    Date of Exam:  11/25/2008    Results:  Normal Bilateral, Abnormal Left  Mammogram Comments:    had extra views...scarring  Next Mammogram Due:    12/21/2009  Osteoporosis Risk Assessment:  Risk Factors for Fracture or Low Bone Density:   Race (White or Asian):     yes   Smoking status:       never  Immunization & Chemoprophylaxis:    Tetanus vaccine: Td  (11/12/2005)  Patient Instructions: 1)  RTC as needed  Prescriptions: AMBIEN 5 MG TABS (ZOLPIDEM TARTRATE) 1 tab by mouth at night as needed insomnia  #30 x 2   Entered and Authorized by:   Shaune Leeks MD   Signed by:   Shaune Leeks MD on 12/31/2008   Method used:   Print then Give to Patient   RxID:   [id number]   Prior Medications (reviewed today): ALEVE 220 MG  TABS (NAPROXEN SODIUM) two times a day as needed. LIPITOR 10 MG  TABS (ATORVASTATIN CALCIUM) one tab by  mouth at night NASONEX 50 MCG/ACT  SUSP (MOMETASONE FUROATE) 1 spray two times a day ADULT ASPIRIN EC LOW STRENGTH 81 MG  TBEC (ASPIRIN) Take 1 tablet by mouth once a day ADVAIR DISKUS 250-50 MCG/DOSE MISC (FLUTICASONE-SALMETEROL) one inhalation as needed ZEGERID 40-1100 MG CAPS (OMEPRAZOLE-SODIUM BICARBONATE) Take 1 tablet by mouth once a day Current Allergies: ! SULFASALAZINE (SULFASALAZINE) ! GNP IODINE (IODINE) ! PENICILLIN V POTASSIUM (PENICILLIN V POTASSIUM) ! * BENAZINE ! * YEAST ! * DUST ! LATEX GLOVES (DISPOSABLE GLOVES) ! PHENERGAN ! * BEE STINGS ! * MOLD ! * MILK  Appended Document: CPX/CLE Sees Dr Rosalio Macadamia for Gyn, seen in July. Pap nml.

## 2010-06-14 NOTE — Assessment & Plan Note (Signed)
Summary: CPX/DLO   Vital Signs:  Patient profile:   65 year old female Height:      65 inches Weight:      166 pounds BMI:     27.72 Temp:     97.7 degrees F oral Pulse rate:   78 / minute Pulse rhythm:   regular BP sitting:   142 / 80  (left arm) Cuff size:   regular  Vitals Entered By: Selena Batten Dance CMA Duncan Dull) (January 06, 2010 11:34 AM) CC: CPx   History of Present Illness: CC: CPE  Stressors in life - mother in ALF having issues (neck fracture, shingles, ribs, knee surgery), drives to [place] almost daily to take care of grandchildren.  Colon - h/o diverticulitis with abnl colonoscopy 2005.  Dr. Sharyon Cable GI.  rec against repeat colonoscopy.  does have flare ups of diarreah/abd pain with certain foods, normally last <1 day.  knows to come in if longer.  preventative - mammo UTD, pap UTD, receives by GYN.  Declines flu shot.  colonoscopy due, see above as pt prefers iFOB.  h/o fibrocystic breast s/p mult biopsies, latest one unable to do, scheduled for surgical excision in september.  Current Medications (verified): 1)  Aleve 220 Mg  Tabs (Naproxen Sodium) .... Two Times A Day As Needed. 2)  Lipitor 10 Mg  Tabs (Atorvastatin Calcium) .... One Tab By Mouth At Night 3)  Nasonex 50 Mcg/act  Susp (Mometasone Furoate) .Marland Kitchen.. 1 Spray Two Times A Day As Needed 4)  Adult Aspirin Ec Low Strength 81 Mg  Tbec (Aspirin) .... Take 1 Tablet By Mouth Once A Day 5)  Advair Diskus 250-50 Mcg/dose Misc (Fluticasone-Salmeterol) .... One Inhalation As Needed 6)  Zegerid 40-1100 Mg Caps (Omeprazole-Sodium Bicarbonate) .... Take 1 Tablet By Mouth Once A Day 7)  Ambien 5 Mg Tabs (Zolpidem Tartrate) .Marland Kitchen.. 1 Tab By Mouth At Night As Needed Insomnia  Allergies: 1)  ! Sulfasalazine (Sulfasalazine) 2)  ! Gnp Iodine (Iodine) 3)  ! * Benazine 4)  ! * Yeast 5)  ! * Dust 6)  ! Latex Gloves (Disposable Gloves) 7)  ! Phenergan 8)  ! * Bee Stings 9)  ! * Mold 10)  Penicillin V Potassium (Penicillin V  Potassium) 11)  * Milk  Past History:  Past medical, surgical, family and social histories (including risk factors) reviewed for relevance to current acute and chronic problems.  Past Medical History: Diverticulosis, colon Hyperlipidemia Osteoarthritis nodules on larynx fibromyalgia calcifications in L breast, schedule for surgical excision 01/2010  Past Surgical History:                    Appendectomy                    Jaw Surgery                    L Knee ORIF                    Osteotomy L Foot                    Breast Cystectomies due to FCBD 1987            TAH BSO    Endometriosis 03/28/99      DEXA osteopenia L/S 98                Colonoscopy - nml (sigmoidoscopy - [facility] - nml.) 6/00  Head MRI, Head CT - ? H/A Focus Meryl Crutch) 10/16/01          EMG/MCV (+) mild carpal tunnel 03/12/03      DEXA - 2.2  Spine 0.1 Hip, osteopenia 12/12/01        MRI R hip Tendonitis Gluteus Medius to Gtr Trochanter Neg Bursitis(Dr. Montez Morita) 11/21/01        L/S films nml, R hip nml. 11/21/01        MRI U/S nml, min. DDD L/3-4 01/04/04        Colonoscopy - polyps, diverticulosis/severe 11/14/05          DEXA - 1.9 spine   0.3 Hip  Slight improvement 06/11/06        Flex laryngoscopy (Juengel) nml. 11/22/06        DEXA improved osteo at fem neck 11/04/08        [place] Bunionectomy R Gt Toe (Dr Lestine Box)   Family History: Reviewed history from 12/31/2008 and no changes required. Father: Died 66 CA lung (smoker)      (00) Mother: Alive 65  dementia osteoporosis/lumbar spine, stroke x 5 @ 73 YOA, arthritis of hands, emphysema, valve regurg. forgetfulness Siblings: None CV:  (+) GF, (+) MGF palpitations and arrhythmia HBP:  ? MGF DM:  (-) Lung CA:  (+) Father ETOH:  PGF Strokes:  + x 5 in mother, ? PGF  Social History: Reviewed history from 11/22/2006 and no changes required. 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 No apparent alopecia or balding. Eyes:  Conjunctiva clear bilaterally.  Ears:  R ear normal and L ear normal.  Mild cerumen bilat. Nose:  External nasal examination shows no deformity or inflammation. Nasal mucosa are pink and moist without lesions or exudates. Mouth:  pharynx pink and moist, no erythema, and no exudates.   Neck:  No deformities, masses, or tenderness noted. Lungs:  Normal respiratory effort, chest expands symmetrically. Lungs are clear to auscultation, no crackles or wheezes. Heart:  Normal rate and regular rhythm. S1 and S2 normal without gallop, murmur, click, rub or other extra sounds. Abdomen:  Bowel sounds positive,abdomen soft and non-tender without masses, organomegaly or hernias noted. Msk:  no acute joint changes  Pulses:  R radial normal and L radial normal.   Extremities:  no edema  Neurologic:  CN grossly intact, station and gait intact Skin:  Intact without suspicious lesions or rashes Psych:  nl affect, good interaction and eye contact.   Impression & Recommendations:  Problem # 1:  HEALTH MAINTENANCE EXAM (ICD-V70.0) immunizations updated, preventative protocol reviewed.  declines flu shot today.  UTD tetanus.  Problem # 2:  HYPERCHOLESTEROLEMIA, PURE (ICD-272.0) discussed labwork, elevated LDL, pt attributes to loss of care in diet.  will watch more closely. Her updated medication list for this problem includes:    Lipitor 10 Mg Tabs (Atorvastatin calcium) ..... One tab by mouth at night  Labs Reviewed: SGOT: 28 (12/30/2009)   SGPT: 30 (12/30/2009)   HDL:53.10 (12/30/2009), 52.30 (12/24/2008)  LDL:108 (12/30/2009), 90  (12/24/2008)  Chol:181 (12/30/2009), 163 (12/24/2008)  Trig:100.0 (12/30/2009), 106.0 (12/24/2008)  Problem # 3:  BREAST MASS, LEFT (ICD-611.72) being followed by surgeon, scheduled  for possible surgical excision next month.  Problem # 4:  FIBROMYALGIA (ICD-729.1) only on alleve for this.  controlled. Her updated medication list for this problem includes:    Aleve 220 Mg Tabs (Naproxen sodium) .Marland Kitchen..Marland Kitchen Two times a day as needed.    Adult Aspirin Ec Low Strength 81 Mg Tbec (Aspirin) .Marland Kitchen... Take 1 tablet by mouth once a day  Problem # 5:  SPECIAL SCREENING FOR MALIGNANT NEOPLASMS COLON (ICD-V76.51) iFOB today.  Complete Medication List: 1)  Aleve 220 Mg Tabs (Naproxen sodium) .... Two times a day as needed. 2)  Lipitor 10 Mg Tabs (Atorvastatin calcium) .... One tab by mouth at night 3)  Nasonex 50 Mcg/act Susp (Mometasone furoate) .Marland Kitchen.. 1 spray two times a day as needed 4)  Adult Aspirin Ec Low Strength 81 Mg Tbec (Aspirin) .... Take 1 tablet by mouth once a day 5)  Advair Diskus 250-50 Mcg/dose Misc (Fluticasone-salmeterol) .... One inhalation as needed 6)  Zegerid 40-1100 Mg Caps (Omeprazole-sodium bicarbonate) .... Take 1 tablet by mouth once a day 7)  Ambien 5 Mg Tabs (Zolpidem tartrate) .Marland Kitchen.. 1 tab by mouth at night as needed insomnia 8)  Zostavax [address] Unt/0.35ml Solr (Zoster vaccine live) .... Administer  Patient Instructions: 1)  Shingles shot prescription - check with pharmacy/insurance about price. 2)  If you have worsening left quadrant pain, please return to be checked. 3)  Sent home with  stool study today. 4)  Pleasure to see you today.  Call clinic with questions. Prescriptions: NASONEX 50 MCG/ACT  SUSP (MOMETASONE FUROATE) 1 spray two times a day as needed  #1 x 3   Entered and Authorized by:   [name]  MD   Signed by:   [name]  MD on 01/06/2010   Method used:   [facility]        [facility] [phone]* (retail)       [address] [place], [place]  [address]       Ph: [phone] or [phone]       Fax: [phone]   RxID:   [id number] LIPITOR 10 MG  TABS (ATORVASTATIN CALCIUM) one tab by mouth at night  #30 x 6   Entered and Authorized by:   [name]  MD   Signed by:   [name]  MD on 01/06/2010   Method used:   Electronically to        [facility] [phone]* (retail)       [address] [place], [place]  [address]       Ph: [phone] or [phone]       Fax: [phone]   RxID:   [id number] ZOSTAVAX 19400 UNT/0.65ML SOLR (ZOSTER VACCINE LIVE) administer  #1 x 0   Entered and Authorized by:   [name]  MD   Signed by:   [name]  MD on 01/06/2010   Method used:   Print then Give to Patient   RxID:   [id number] PXTGGYIR 19400 UNT/0.65ML SOLR (ZOSTER VACCINE LIVE) administer  #1 x 0   Entered and Authorized by:   [name]  MD   Signed by:   [name]  MD on 01/06/2010   Method used:   [facility] to        [facility] [phone]* (retail)       [address] [place], [place]  [address]       Ph: [phone] or [phone]       Fax: [phone]   RxID:   [id number]   Current Allergies (reviewed today): ! SULFASALAZINE (SULFASALAZINE) ! GNP IODINE (IODINE) ! * BENAZINE ! * YEAST ! * DUST ! LATEX GLOVES (DISPOSABLE GLOVES) ! PHENERGAN ! * BEE STINGS ! * MOLD PENICILLIN V POTASSIUM (PENICILLIN V POTASSIUM) * MILK   Prevention & Chronic Care Immunizations   Influenza vaccine: Not documented   Influenza vaccine deferral: Refused  (01/06/2010)    Tetanus booster: 11/12/2005: Td   Tetanus booster due: 11/13/2015    Pneumococcal vaccine: Not documented    H. zoster vaccine: Not documented  Colorectal Screening   Hemoccult: Negative  (11/12/2005)    Colonoscopy: Not documented  Other Screening   Pap smear: Normal, Satisfactory  (11/25/2008)     Mammogram: Normal Bilateral  (06/24/2009)   Mammogram due: 12/2009    DXA bone density scan: Not documented   Smoking status: never  (12/31/2008)  Lipids   Total Cholesterol: 181  (12/30/2009)   LDL: 108  (12/30/2009)   LDL Direct: 147.2  (11/22/2006)   HDL: 53.10  (12/30/2009)   Triglycerides: 100.0  (12/30/2009)    SGOT (AST): 28  (12/30/2009)   SGPT (ALT): 30  (12/30/2009)   Alkaline phosphatase: 88  (12/30/2009)   Total bilirubin: 0.6  (12/30/2009)    Lipid flowsheet reviewed?: Yes   Progress toward LDL goal: At goal  Self-Management Support :   Personal Goals (by the next clinic visit) :      Personal LDL goal: 130  (01/06/2010)    Lipid self-management support: Not documented

## 2010-06-14 NOTE — Progress Notes (Signed)
Summary: PT HAVING ABD PAIN --SAYS AVERAGE 6 AS LEVEL OF PAIN  Phone Note Call from Patient   Caller: Patient Reason for Call: Acute Illness Complaint: Abdominal Pain Details for Reason: PAIN IN THE MIDDLE OF HER STOMACH.  Summary of Call: [facility] PT.... HAVING STOMACH PAIN X 1 DAY IN MIDDLE OF STOMACH, AVERAGE 6 AS PAIN LEVEL. , VOMITING AND DIARRHEA LAST NIGHT.  HAS AN APPOINTMENT ON TOMORROW . WANTS SOMETHING FOR PAIN NOW, OR RECOMMEND SOMETHING OTC.   CALL BACK # [phone] [facility] [address]   Initial call taken by: Daine Gip,  July 13, 2008 2:42 PM  Follow-up for Phone Call        without exam, can't send in Rx okay to add on at end of day if needed Follow-up by: Cindee Salt MD,  July 13, 2008 2:56 PM  Additional Follow-up for Phone Call Additional follow up Details #1::        Patient is in [place] babysitting her grandkids and can't leave until 5:30pm.  I told her if she feels worse she should go to the ER.  She said she would go,if she feels worse.  I was able to get her an earlier appt. w/ Dr.Tower tomorrow. Additional Follow-up by: Beau Fanny,  July 13, 2008 3:30 PM    Additional Follow-up for Phone Call Additional follow up Details #2::    thanks!! Follow-up by: Cindee Salt MD,  July 13, 2008 4:37 PM

## 2010-06-14 NOTE — Progress Notes (Signed)
  Phone Note Outgoing Call   Summary of Call: spoke to pt-- she is feeling much better and diarrhea is slowing down significantly  is able to drink fluids  no abd pain  had side eff from phenergan --made her dizzy and out of it-- this was noted for her med intolerance list  I adv her to update me if symptoms return or worsen  Initial call taken by: Judith Part MD,  July 17, 2008 2:09 PM

## 2010-06-14 NOTE — Letter (Signed)
Summary: Ashley [name] letter  [place] at [facility]  [address] [place], [place] [address]   Phone: [phone]  Fax: [phone]       12/21/2009 MRN: [id number]  Ashley [name] [address] [place], [place]  [address]  Dear Ashley [name],  [facility] [place], and  announce the retirement of Arta Silence, M.D., from full-time practice at the [facility] office effective November 11, 2009 and his plans of returning part-time.  It is important to Dr. Hetty Ely and to our practice that you understand that [facility] has seven physicians in our office for your health care needs.  We will continue to offer the same exceptional care that you have today.    Dr. Hetty Ely has spoken to many of you about his plans for retirement and returning part-time in the fall.   We will continue to work with you through the transition to schedule appointments for you in the office and meet the high standards that [facility] is committed to.   Again, it is with great pleasure that we share the news that Dr. Hetty Ely will return to [facility] at [facility] in October of 2011 with a reduced schedule.    If you have any questions, or would like to request an appointment with one of our physicians, please call us at [phone] and press the option for Scheduling an appointment.  We take pleasure in providing you with excellent patient care and look forward to seeing you at your next office visit.  Our [facility] Physicians are:  Tillman Abide, M.D. Laurita Quint, M.D. Roxy Manns, M.D. Kerby Nora, M.D. Hannah Beat, M.D. Ruthe Mannan, M.D. We proudly welcomed Raechel Ache, M.D. and [name], M.D. to the practice in July/August 2011.  Sincerely,  [facility]

## 2010-06-14 NOTE — Consult Note (Signed)
Summary: [facility]/Dr. Yates/Consultation Report  [facility]/Dr. Yates/Consultation Report   Imported By: Mickle Asper 02/22/2007 14:44:30  _____________________________________________________________________  External Attachment:    Type:   Image     Comment:   External Document

## 2010-06-14 NOTE — Letter (Signed)
Summary: [facility]: Immunoassay Fecal Occult Blood (iFOB) Order Form  [place] at [facility]  [address] [place], [place] [address]   Phone: [phone]  Fax: [phone]      [facility]: Immunoassay Fecal Occult Blood (iFOB) Order Form   January 06, 2010 MRN: [id number]   Ashley [name] 04-Sep-1945   Physicican Name:_________________________  Diagnosis Code:__________________________      Ashley Boyden  MD

## 2010-06-14 NOTE — Letter (Signed)
Summary: Results Follow up Letter  [place] at [facility]  [address] [place], [place] [address]   Phone: [phone]  Fax: [phone]    06/28/2009 MRN: [id number]  [place] [address] [place], [place]  [address]  Dear Ms. Mccormac,  The following are the results of your recent test(s):  Test         Result    Pap Smear:        Normal _____  Not Normal _____ Comments: ______________________________________________________ Cholesterol: LDL(Bad cholesterol):         Your goal is less than:         HDL (Good cholesterol):       Your goal is more than: Comments:  ______________________________________________________ Mammogram:        Normal __X___  Not Normal _____ Comments:Please follow-up in 6 months with a diagnostic again on left and screening on the right at the same time.  ___________________________________________________________________ Hemoccult:        Normal _____  Not normal _______ Comments:    _____________________________________________________________________ Other Tests:    We routinely do not discuss normal results over the telephone.  If you desire a copy of the results, or you have any questions about this information we can discuss them at your next office visit.   Sincerely,    Laurita Quint, MD

## 2010-06-14 NOTE — Progress Notes (Signed)
----   Converted from flag ---- ---- 12/29/2009 8:07 AM, [name]  MD wrote: CMP, FLP 272.0  ---- 12/29/2009 7:56 AM, Liane Comber CMA (AAMA) wrote: Peri [place] Morning! This pt is scheduled for cpx labs tomorrow, which labs to draw and dx codes to use? Thanks Tasha ------------------------------

## 2010-06-14 NOTE — Miscellaneous (Signed)
Summary: allergy list update   Clinical Lists Changes  Allergies: Added new allergy or adverse reaction of PHENERGAN     Prior Medications: ALEVE 220 MG  TABS (NAPROXEN SODIUM) two times a day as needed. LIPITOR 10 MG  TABS (ATORVASTATIN CALCIUM) one tab by mouth at night TYLENOL 325 MG  TABS (ACETAMINOPHEN) as needed ACCUNEB 0.63 MG/3ML  NEBU (ALBUTEROL SULFATE) use as needed for asthma from GERD NASONEX 50 MCG/ACT  SUSP (MOMETASONE FUROATE) 1 spray two times a day ADULT ASPIRIN EC LOW STRENGTH 81 MG  TBEC (ASPIRIN) Take 1 tablet by mouth once a day ADVAIR DISKUS 250-50 MCG/DOSE MISC (FLUTICASONE-SALMETEROL) one inhalation as needed ZEGERID 40-1100 MG CAPS (OMEPRAZOLE-SODIUM BICARBONATE) Take 1 tablet by mouth once a day PROMETHAZINE HCL 25 MG TABS (PROMETHAZINE HCL) 1 by mouth up to three times a day as needed nausea Current Allergies: ! SULFASALAZINE (SULFASALAZINE) ! GNP IODINE (IODINE) ! PENICILLIN V POTASSIUM (PENICILLIN V POTASSIUM) ! * BENAZINE ! * YEAST ! * DUST ! LATEX GLOVES (DISPOSABLE GLOVES) ! PHENERGAN ! * BEE STINGS ! * MOLD ! * MILK

## 2010-06-14 NOTE — Assessment & Plan Note (Signed)
Summary: LUMP IN BREAST   Vital Signs:  Patient Profile:   65 Years Old Female Height:     65.25 inches Weight:      165 pounds Temp:     98.1 degrees F oral Pulse rate:   90 / minute BP sitting:   115 / 75  (right arm) Cuff size:   regular  Vitals Entered By: Cooper Render (August 12, 2007 8:40 AM)                 Chief Complaint:  lump L) breast.  History of Present Illness: Here due to lump in L breast--found yesterday.  Mammo scheduled for 7/09.    Current Allergies (reviewed today): ! SULFASALAZINE (SULFASALAZINE) ! GNP IODINE (IODINE) ! PENICILLIN V POTASSIUM (PENICILLIN V POTASSIUM) ! * BENAZINE ! * YEAST ! * DUST ! LATEX GLOVES (DISPOSABLE GLOVES) ! * BEE STINGS ! * MOLD ! * MILK  Past Medical History:    Reviewed history from 11/22/2006 and no changes required:       Diverticulosis, colon       Hyperlipidemia       Osteoarthritis     Review of Systems      See HPI   Physical Exam  General:     alert, well-developed, well-nourished, and well-hydrated.   Breasts:     R breast neg to palp L breast more "knotty" with several small nodules palp, the one at 11o'clock is tender-- ~51mm. has surgical scar at 9-10 o'clock L  nipples without dischargeno sbuclavicular or aux nodes Neurologic:     alert & oriented X3, sensation intact to light touch, and gait normal.   Axillary Nodes:     no R axillary adenopathy and no L axillary adenopathy.   Psych:     normally interactive, good eye contact, and slightly anxious.      Impression & Recommendations:  Problem # 1:  BREAST MASS, LEFT (ICD-611.72) will refer to [facility] in [place] for diagnostic mammo--due to questionable mass felt in L breast at 11 o'clock has hx of benign breast mass in L breast Orders: Radiology Referral (Radiology)   Complete Medication List: 1)  Aleve 220 Mg Tabs (Naproxen sodium) .... Two times a day as needed. 2)  Lipitor 10 Mg Tabs (Atorvastatin calcium) .... One tab  by mouth at night 3)  Tylenol 325 Mg Tabs (Acetaminophen) .... As needed 4)  Accuneb 0.63 Mg/10ml Nebu (Albuterol sulfate) .... Use as needed for asthma from gerd 5)  Nasonex 50 Mcg/act Susp (Mometasone furoate) .Marland Kitchen.. 1 spray two times a day 6)  Adult Aspirin Ec Low Strength 81 Mg Tbec (Aspirin) .... Take 1 tablet by mouth once a day 7)  Zithromax Z-pak 250 Mg Tabs (Azithromycin) .... Use as directed   Patient Instructions: 1)  refer for mammo    ] Prior Medications (reviewed today): ALEVE 220 MG  TABS (NAPROXEN SODIUM) two times a day as needed. LIPITOR 10 MG  TABS (ATORVASTATIN CALCIUM) one tab by mouth at night TYLENOL 325 MG  TABS (ACETAMINOPHEN) as needed ACCUNEB 0.63 MG/3ML  NEBU (ALBUTEROL SULFATE) use as needed for asthma from GERD NASONEX 50 MCG/ACT  SUSP (MOMETASONE FUROATE) 1 spray two times a day ADULT ASPIRIN EC LOW STRENGTH 81 MG  TBEC (ASPIRIN) Take 1 tablet by mouth once a day ZITHROMAX Z-PAK 250 MG  TABS (AZITHROMYCIN) use as directed Current Allergies (reviewed today): ! SULFASALAZINE (SULFASALAZINE) ! GNP IODINE (IODINE) ! PENICILLIN V POTASSIUM (PENICILLIN V POTASSIUM) ! *  BENAZINE ! * YEAST ! * DUST ! LATEX GLOVES (DISPOSABLE GLOVES) ! * BEE STINGS ! * MOLD ! * MILK

## 2010-06-14 NOTE — Miscellaneous (Signed)
  Clinical Lists Changes  Observations: Added new observation of MAMMO DUE: 08/2008 (08/29/2007 14:58) Added new observation of MAMMOGRAM: normal (08/14/2007 14:59)       Preventive Care Screening  Mammogram:    Date:  08/14/2007    Next Due:  08/2008    Results:  normal

## 2010-06-14 NOTE — Letter (Signed)
Summary: [facility] / FOLLOW UP / DR. PAUL BEDNARZ  [facility] / FOLLOW UP / DR. PAUL ZOXWRUE   Imported By: Carin Primrose 11/24/2008 12:14:33  _____________________________________________________________________  External Attachment:    Type:   Image     Comment:   External Document

## 2010-06-14 NOTE — Progress Notes (Signed)
Summary: iFOB incorrectly collected  Phone Note From Other Clinic   Caller: Clydie Braun @ [facility] Summary of Call: Patient collected ifob incorrectly. Needs to re submit sample. Initial call taken by: Mills Koller,  February 10, 2010 9:13 AM  Follow-up for Phone Call        routed to kim. Follow-up by: [name]  MD,  February 10, 2010 9:31 AM  Additional Follow-up for Phone Call Additional follow up Details #1::        Left message on mail to return my call. Kim Dance CMA Duncan Dull)  February 10, 2010 10:05 AM   Patient aware and requested another kit be mailed to her because she is in [place] everyday and can't get by to pick another one up and she is having surgery when she finishes up in [place]. I told her I would get one in the mail to her. Kim Dance CMA Duncan Dull)  February 11, 2010 11:27 AM   Additional Follow-up by: Janee Morn CMA Duncan Dull),  February 11, 2010 11:28 AM

## 2010-06-14 NOTE — Letter (Signed)
Summary: Dr.Paul Bednarz,[facility],Note  Dr.Paul Bednarz, Orthopaedic,Note   Imported By: Beau Fanny 01/26/2009 16:27:28  _____________________________________________________________________  External Attachment:    Type:   Image     Comment:   External Document

## 2010-06-14 NOTE — Progress Notes (Signed)
Summary: Chest pain  Phone Note Call from Patient Call back at [phone]   Caller: Patient Call For: Dr. Hetty Ely Summary of Call: Pt has an appt. scheduled with [facility] tomorrow. Pt has chest pain,  in the center in her chest, tingling in her fingers, this has been going on for 2 months, off and on and she is really tired at times.  Pt is not having the pain now, but did have it yesterday and it lasted less than 5 minutes.  When she had the pain over the weekend her fingers got real numb.  Do you think that it is okay for her to wait until tomorrow or what do you recommend?  Pt is not having the pain now. Initial call taken by: Sydell Axon,  August 05, 2007 3:58 PM  Follow-up for Phone Call        Can wait until tomm based on time of day called, length of time sxs last and the fact that it has been occuring infrequently for so long. Follow-up by: Shaune Leeks MD,  August 05, 2007 4:39 PM  Additional Follow-up for Phone Call Additional follow up Details #1::        PATIENT NOTIFIED AND TOLD IF SYMPTOMS GET WORSE TO PLEASE GO TO ER RIGHT AWAY Additional Follow-up by: Providence Crosby,  August 05, 2007 4:49 PM

## 2010-06-14 NOTE — Miscellaneous (Signed)
Summary: iFOB Negative-Flowsheet update   Clinical Lists Changes  Observations: Added new observation of HEMOCULTDUE: 03/2011 (03/31/2010 13:32) Added new observation of HEMOCCULT: negative (03/29/2010 13:32)      Preventive Care Screening  Hemoccult:    Date:  03/29/2010    Next Due:  03/2011    Results:  negative

## 2010-06-14 NOTE — Progress Notes (Signed)
Summary: constipation/ chest discomfort   Phone Note Call from Patient Call back at Home Phone [phone] Call back at cell [phone]   Caller: Patient Call For: schaller Summary of Call: pt has been having discomfort in chest for past few weeks, she has also been constipated for few weeks and feels like it's backing up into abd, when she does have a stool it is very small and hard and her nose starts bleeding but shen she is done nosebleed stops. having waist soreness and it's hard to get up and down. seh says she does not feel bloated and does not have abd pain but doesnt know what to do now. Initial call taken by: Liane Comber,  June 10, 2007 9:31 AM  Follow-up for Phone Call        Increase fiber and walking to get bowels moving and take 30cc Milk of Magnesia if needed, may repeat once in four hours.  If no help after 5-7 days, comew in....if chest pain worsens, to ER for eval. Follow-up by: Shaune Leeks MD,  June 10, 2007 10:08 AM  Additional Follow-up for Phone Call Additional follow up Details #1::        Left message on answering machine. ...............................................Marland KitchenLiane Comber June 10, 2007 12:06 PM   Advised patient.  ......................................................Marland KitchenLiane Comber June 10, 2007 2:33 PM

## 2010-06-14 NOTE — Op Note (Signed)
Summary: [facility] Bunionectomy/Dr. [facility] Bunion[facility]/Dr. Lestine Box   Imported By: Eleonore Chiquito 11/04/2008 10:56:23  _____________________________________________________________________  External Attachment:    Type:   Image     Comment:   External Document  Appended Document: [facility] Bunionectomy/Dr. 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 H/A Focus Meryl Crutch) 10/16/01          EMG/MCV (+) mild carpal tunnel 03/12/03      DEXA - 2.2  Spine 0.1 Hip, osteopenia 12/12/01        MRI R hip Tendonitis Gluteus Medius to Gtr Trochanter Neg Bursitis(Dr. Montez Morita) 11/21/01        L/S films nml, R hip nml. 11/21/01        MRI U/S nml, min. DDD L/3-4 01/04/04        Colonoscopy - polyps, diverticulosis/severe 11/14/05          DEXA - 1.9 spine   0.3 Hip  Slight improvement 06/11/06        Flex laryngoscopy (Juengel) nml. 11/22/06        DEXA improved osteo at fem neck 11/04/08        [facility]ron Bunbionectomy R Gt Toe (Dr Lestine Box)  (11/04/2008 19:13)       Past Surgical History:                       Appendectomy                       Jaw Surgery                       L Knee ORIF                       Osteotomy L Foot                       Breast Cystectomies due to FCBD    1987            TAH BSO    Endometriosis    03/28/99      DEXA osteopenia L/S    98                Colonoscopy - nml (sigmoidoscopy - Gilbert - nml.)    6/00  Head MRI, Head CT - ? H/A Focus Meryl Crutch)  10/16/01          EMG/MCV (+) mild carpal tunnel    03/12/03      DEXA - 2.2  Spine 0.1 Hip, osteopenia    12/12/01        MRI R hip Tendonitis Gluteus Medius to Gtr Trochanter Neg Bursitis(Dr. Montez Morita)    11/21/01        L/S films nml, R hip nml.    11/21/01        MRI U/S nml, min. DDD L/3-4    01/04/04        Colonoscopy - polyps, diverticulosis/severe    11/14/05          DEXA - 1.9 spine   0.3 Hip  Slight improvement    06/11/06        Flex laryngoscopy (Juengel) nml.    11/22/06        DEXA improved osteo at fem neck    11/04/08        Chevron Bunbionectomy R Gt Toe (Dr Lestine Box)

## 2010-06-14 NOTE — Letter (Signed)
Summary: Results Follow up Letter  [facility]  [address] [place], [place] [address]   Phone: [phone]  Fax: [phone]    03/31/2010 MRN: [id number]  [place] [facility] [address] [place], [place]  [address]  Dear Ashley [name],  The following are the results of your recent test(s):  Test         Result    Pap Smear:        Normal _____  Not Normal _____ Comments: ______________________________________________________ Cholesterol: LDL(Bad cholesterol):         Your goal is less than:         HDL (Good cholesterol):       Your goal is more than: Comments:  ______________________________________________________ Mammogram:        Normal _____  Not Normal _____ Comments:  ___________________________________________________________________ Hemoccult:        Normal __X___  Not normal _______ Comments:  Repeat in 1 year  _____________________________________________________________________ Other Tests:    We routinely do not discuss normal results over the telephone.  If you desire a copy of the results, or you have any questions about this information we can discuss them at your next office visit.   Sincerely,      Dr. [name]

## 2010-06-14 NOTE — Assessment & Plan Note (Signed)
Summary: DIARRHEA,STOMACH PAIN,DIVERTICULITIS/CLE   Vital Signs:  Patient Profile:   65 Years Old Female Height:     65.25 inches Weight:      152 pounds BMI:     25.19 Temp:     98.2 degrees F oral Pulse rate:   96 / minute Pulse rhythm:   regular BP sitting:   110 / 64  (left arm) Cuff size:   regular  Vitals Entered By: Liane Comber (July 14, 2008 9:15 AM)                 Chief Complaint:  Diarrhea/ abd pain and diverticulitis.  History of Present Illness: nov was tx for uri with zpak then dx with nodules on her larynx- was put on levaquin   (ENt and UC) since then 2 zpak, 2 levaquin, codiene cough syrup and advair inhaler spitting up green mucous ever since- though improved from what it was has not been on any steroids  last finished abx 2-3 weeks ago---llast dx was bronchitis and uri (? poss walking pneumonia)   sat ate out- steak sunday- 5-6 episodes of abd pain (upper abd)- then vomiting (mostly mucous), followed by abd pain at night-- then felt better in am  monday -- got worse as the day went on-- then developed diarrhea , then very frequent stools, without blood  last night had a fever- highest 102 or lower  some wheezing last night and some diarrhea and vomited several times (chest is also very sore ) has not eating since sat , and when she drinks fluids- she immediately gets diarrhea   has hx of diverticulitis in past - last colonoscopy- with diverticulosis (no hx of bleeding or infection)  today- is still having diarrhea- has slowed down a little - still with no blood  no fever this am (took one ibuprofen last night) this am - no abd pain-- is sluggish feeling/ bloated  no LLQ pain this am   has been eating yogurt due to all the antibiotics  no signs of yeast infection  does have hemorroids  also has fibromyalgia- so reports that illnesses cause her more pain and really wipe her out        Current Allergies: ! SULFASALAZINE (SULFASALAZINE) !  GNP IODINE (IODINE) ! PENICILLIN V POTASSIUM (PENICILLIN V POTASSIUM) ! * BENAZINE ! * YEAST ! * DUST ! LATEX GLOVES (DISPOSABLE GLOVES) ! * BEE STINGS ! * MOLD ! * MILK  Past Medical History:    Diverticulosis, colon    Hyperlipidemia    Osteoarthritis    nodules on larynx    fibromyalgia  Past Surgical History:    Reviewed history from 11/29/2006 and no changes required:                          Appendectomy                          Jaw Surgery                          L Knee ORIF                          Osteotomy L Foot                          Breast Cystectomies  due to FCBD       1987            TAH BSO    Endometriosis       03/28/99      DEXA osteopenia L/S       98                Colonoscopy - nml (sigmoidoscopy - Sullivan Lone - nml.)       6/00             Head MRI, Head CT - ? H/A Focus Meryl Crutch)       10/16/01          EMG/MCV (+) mild carpal tunnel       03/12/03      DEXA - 2.2  Spine 0.1 Hip, osteopenia       12/12/01        MRI R hip Tendonitis Gluteus Medius to Gtr Trochanter Neg Bursitis(Dr. Montez Morita)       11/21/01        L/S films nml, R hip nml.       11/21/01        MRI U/S nml, min. DDD L/3-4       01/04/04        Colonoscopy - polyps, diverticulosis/severe       11/14/05          DEXA - 1.9 spine   0.3 Hip  Slight improvement       06/11/06        Flex laryngoscopy (Juengel) nml.       11/22/06        DEXA improved osteo at fem neck   Family History:    Reviewed history from 11/26/2006 and no changes required:       Father: Died 40 CA lung (smoker)      (00)       Mother: Alive 14  osteoporosis/lumbar spine, stroke x 5 @ 23 YOA, arthritis of hands, emphysema, valve regurg. forgetfulness       Siblings: None       CV:  (+) GF, (+) MGF palpitations and arrhythmia       HBP:  ? MGF       DM:  (-)       Lung CA:  (+) Father       ETOH:  PGF       Strokes:  + x 5 in mother, ? PGF  Social History:    Reviewed history from 11/22/2006 and no changes required:        Never Smoked       Alcohol use-no       Drug use-no       Marital Status: Married, lives with husband       Children: 1 adopted       Occupation: Housewife,  Technical sales engineer       Was rental sales office management    Review of Systems  General      Complains of fatigue, fever, loss of appetite, and malaise.  Eyes      Denies blurring, eye irritation, and itching.  ENT      Complains of postnasal drainage.      Denies sore throat.      had thrush- was tx by ENT- better now  CV      Complains of chest pain or discomfort.      Denies palpitations and shortness of breath with  exertion.  Resp      Complains of chest discomfort, cough, sputum productive, and wheezing.  GI      Complains of diarrhea, indigestion, nausea, and vomiting.  Derm      Denies itching and rash.  Endo      Denies excessive thirst and excessive urination.   Physical Exam  General:     somewhat fatigued appearing  Head:     normocephalic, atraumatic, and no abnormalities observed.   Eyes:     vision grossly intact, pupils equal, pupils round, and pupils reactive to light.  no conjunctival pallor, injection or icterus  Ears:     R ear normal and L ear normal.   Nose:     nares injected but clear  Mouth:     pharynx pink and moist, no erythema, and no exudates.   Neck:     No deformities, masses, or tenderness noted. scab of papule on R side of neck- healed without rash Chest Wall:     No deformities, masses, or tenderness noted. Lungs:     harsh cough without production CTA with good air exch- no rales/rhonchi or wheeze  breathing is not labored  Heart:     normal rate, regular rhythm, no murmur, and no gallop.   Abdomen:     soft with nl bs in 4 quads  tender in all quads to deep palp- pt describes as worse in epigastrium and LLQ- no gaurding or rebound tenderness no distention, no masses, no hepatomegaly, and no splenomegaly.   Msk:     no acute joint changes  Extremities:      no edema  Skin:     Intact without suspicious lesions or rashes no pallor or jaundice  brisk capillary refil and nl turgor Cervical Nodes:     No lymphadenopathy noted Inguinal Nodes:     No significant adenopathy Psych:     nl affect- generally fatigued    Impression & Recommendations:  Problem # 1:  DIARRHEA (ICD-787.91) Assessment: New with upper and L lower abd pain/ hx of diverticulitis and also symptoms consistent with gastroenteritis (a little improved this am) disc imp of slow fluid rehydration and given px for phenergan to use as needed stool sample for c diff (recent abx) labs- cbc, bmp  will adv further when labs return-- consider abx tx if wbc elevated , and CT if abd pain worsens adv to call if symptoms worsen Orders: Venipuncture (81191) TLB-BMP (Basic Metabolic Panel-BMET) (80048-METABOL) TLB-CBC Platelet - w/Differential (85025-CBCD) TLB-Hepatic/Liver Function Pnl (80076-HEPATIC) T-Culture, C-Diff Toxin A/B ([id number])   Problem # 2:  ABDOMINAL PAIN (ICD-789.00) Assessment: New see above- labs pending Orders: Venipuncture ([id number]) TLB-BMP (Basic Metabolic Panel-BMET) (80048-METABOL) TLB-CBC Platelet - w/Differential (85025-CBCD) TLB-Hepatic/Liver Function Pnl (80076-HEPATIC)   Problem # 3:  URI (ICD-465.9) Assessment: Improved ongoing prod cough- per pt some imp but still cough and occ wheeze (after several courses of abx) fairly nl lung exam today adv to continue taking flow fluids - update if worse cough or wheeze  Her updated medication list for this problem includes:    Aleve 220 Mg Tabs (Naproxen sodium) .Marland Kitchen..Marland Kitchen Two times a day as needed.    Tylenol 325 Mg Tabs (Acetaminophen) .Marland Kitchen... As needed    Adult Aspirin Ec Low Strength 81 Mg Tbec (Aspirin) .Marland Kitchen... Take 1 tablet by mouth once a day    Promethazine Hcl 25 Mg Tabs (Promethazine hcl) .Marland Kitchen... 1 by mouth up to three times a day as needed nausea  Complete Medication List: 1)  Aleve 220 Mg Tabs  (Naproxen sodium) .... Two times a day as needed. 2)  Lipitor 10 Mg Tabs (Atorvastatin calcium) .... One tab by mouth at night 3)  Tylenol 325 Mg Tabs (Acetaminophen) .... As needed 4)  Accuneb 0.63 Mg/7ml Nebu (Albuterol sulfate) .... Use as needed for asthma from gerd 5)  Nasonex 50 Mcg/act Susp (Mometasone furoate) .Marland Kitchen.. 1 spray two times a day 6)  Adult Aspirin Ec Low Strength 81 Mg Tbec (Aspirin) .... Take 1 tablet by mouth once a day 7)  Advair Diskus 250-50 Mcg/dose Misc (Fluticasone-salmeterol) .... One inhalation as needed 8)  Zegerid 40-1100 Mg Caps (Omeprazole-sodium bicarbonate) .... Take 1 tablet by mouth once a day 9)  Promethazine Hcl 25 Mg Tabs (Promethazine hcl) .Marland Kitchen.. 1 by mouth up to three times a day as needed nausea   Patient Instructions: 1)  doing labs today- will update you when these return for a plan 2)  try to take sips of fluid through the day 3)  if your symptoms worsen- or vomiting returns- please update me  4)  take the phenergan for nausea as needed- warning- it may sedate    Prescriptions: PROMETHAZINE HCL 25 MG TABS (PROMETHAZINE HCL) 1 by mouth up to three times a day as needed nausea  #15 x 0   Entered by:   Judith Part MD   Authorized by:   Shaune Leeks MD   Signed by:   Judith Part MD on 07/14/2008   Method used:   Print then Give to Patient   RxID:   [id number]   Prior Medications: ALEVE 220 MG  TABS (NAPROXEN SODIUM) two times a day as needed. LIPITOR 10 MG  TABS (ATORVASTATIN CALCIUM) one tab by mouth at night TYLENOL 325 MG  TABS (ACETAMINOPHEN) as needed ACCUNEB 0.63 MG/3ML  NEBU (ALBUTEROL SULFATE) use as needed for asthma from GERD NASONEX 50 MCG/ACT  SUSP (MOMETASONE FUROATE) 1 spray two times a day ADULT ASPIRIN EC LOW STRENGTH 81 MG  TBEC (ASPIRIN) Take 1 tablet by mouth once a day ADVAIR DISKUS 250-50 MCG/DOSE MISC (FLUTICASONE-SALMETEROL) one inhalation as needed ZEGERID 40-1100 MG CAPS (OMEPRAZOLE-SODIUM  BICARBONATE) Take 1 tablet by mouth once a day Current Allergies: ! SULFASALAZINE (SULFASALAZINE) ! GNP IODINE (IODINE) ! PENICILLIN V POTASSIUM (PENICILLIN V POTASSIUM) ! * BENAZINE ! * YEAST ! * DUST ! LATEX GLOVES (DISPOSABLE GLOVES) ! * BEE STINGS ! * MOLD ! * MILK

## 2010-06-14 NOTE — Letter (Signed)
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 H/A Focus Meryl Crutch) 10/16/01          EMG/MCV (+) mild carpal tunnel 03/12/03      DEXA - 2.2  Spine 0.1 Hip, osteopenia 12/12/01        MRI R hip Tendonitis Gluteus Medius to Gtr Trochanter Neg Bursitis(Dr. Montez Morita) 11/21/01        L/S films nml, R hip nml. 11/21/01        MRI U/S nml, min. DDD L/3-4 01/04/04        Colonoscopy - polyps, diverticulosis/severe 11/14/05          DEXA - 1.9 spine   0.3 Hip  Slight improvement 06/11/06        Flex laryngoscopy (Juengel) nml. 11/22/06        DEXA improved osteo at fem neck 11/04/08        [facility]ron Bunionectomy R Gt Toe (Dr Lestine Box)  01/2010         L breast mass s/p surg excision - fibrocystic change w/ intraductal papilloma, no malignancy (04/14/2010 14:51) Added new observation of PAST MED HX: Diverticulosis, colon Hyperlipidemia Osteoarthritis nodules on larynx fibromyalgia (04/14/2010 14:51)        Past History:  Past Medical History: Diverticulosis, colon Hyperlipidemia Osteoarthritis nodules on larynx fibromyalgia  Past Surgical History:                    Appendectomy                    Jaw Surgery            L Knee ORIF                    Osteotomy L Foot                    Breast Cystectomies due to FCBD 1987            TAH BSO    Endometriosis 03/28/99      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 H/A Focus Meryl Crutch) 10/16/01          EMG/MCV (+) mild carpal tunnel 03/12/03      DEXA - 2.2  Spine 0.1 Hip, osteopenia 12/12/01        MRI R hip Tendonitis Gluteus Medius to Gtr Trochanter Neg Bursitis(Dr. Montez Morita) 11/21/01        L/S films nml, R hip nml. 11/21/01        MRI U/S nml, min. DDD L/3-4 01/04/04        Colonoscopy - polyps, diverticulosis/severe 11/14/05          DEXA - 1.9 spine   0.3 Hip  Slight improvement 06/11/06        Flex laryngoscopy (Juengel) nml. 11/22/06        DEXA improved osteo at fem neck 11/04/08        Chevron Bunionectomy R Gt Toe (Dr Lestine Box)  01/2010         L breast mass s/p surg excision - fibrocystic change w/ intraductal papilloma, no malignancy

## 2010-06-14 NOTE — Assessment & Plan Note (Signed)
Summary: tingling in fingers, both hands, pain in  chest/bir   Vital Signs:  Patient Profile:   65 Years Old Female Height:     65.25 inches Weight:      165 pounds Temp:     97.7 degrees F oral Pulse rate:   93 / minute BP sitting:   113 / 70  (left arm) Cuff size:   regular  Vitals Entered By: Cooper Render (August 06, 2007 4:40 PM)                 Chief Complaint:  tingling in hands & fingers, heaviness & pain in chest, and throat raw ? sinus/bronchitis.  History of Present Illness: Here for tingling in fingers and hands---onset x 3-58mo off and on, worse with driving.  Uses Aleve for fibromyalgia for pain as needed.  Getting angry, gets tired easily.  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  Lots of phone calls from mom daily, there 2-3 xwk.  Here due to heaviness alnd chest pain   Here for URI sx--nasal congestion, ST today--went to chiropractor yesterday and had adjustment and had much mucus released.  Some green mucus, aching, no clhills or fever.  Taking tussin cf started last night.      Current Allergies (reviewed today): ! SULFASALAZINE (SULFASALAZINE) ! GNP IODINE (IODINE) ! PENICILLIN V POTASSIUM (PENICILLIN V POTASSIUM) ! * BENAZINE ! * YEAST ! * DUST ! LATEX GLOVES (DISPOSABLE GLOVES) ! * BEE STINGS ! * MOLD ! * MILK     Review of Systems      See HPI   Physical Exam  General:     alert, well-developed, well-nourished, and well-hydrated.  NAD Ears:     TMs retracted bilat with fluid Nose:     no mucosal edema, no airflow obstruction, and mucosal erythema.   Mouth:     no exudates and pharyngeal erythema.   Lungs:     moist harsh cough, normal respiratory effort, no crackles, and no wheezes.   Heart:     normal rate, regular rhythm, no murmur, and no gallop.   Abdomen:     soft, normal bowel sounds, no distention, no masses, no guarding, no abdominal hernia, no inguinal hernia, no  hepatomegaly, and no splenomegaly.  reports tender throughout to palp Msk:     no joint tenderness, no joint swelling, no joint warmth, no redness over joints, and no joint deformities.   Pulses:     R radial normal and L radial normal.   Extremities:     no edema in either hand or lower  arm Neurologic:     alert & oriented X3, sensation intact to light touch--responds correctly to stimulation with monofilament to both hands and fingers, and gait normal.   Skin:     turgor normal, color normal, and no rashes.   Cervical Nodes:     no anterior cervical adenopathy and no posterior cervical adenopathy.   Psych:     normally interactive and good eye contact.      Impression & Recommendations:  Problem # 1:  URI (ICD-465.9) Assessment: New continue comfort care measures: increase po fluids, rest, tylenol or IBP as needed will start a z pac see backif not improved in 7-10d The following medications were removed from the medication list:    Adult Aspirin Ec Low Strength 81 Mg Tbec (Aspirin)  Her updated medication list for this problem includes:  Aleve 220 Mg Tabs (Naproxen sodium) .Marland Kitchen..Marland Kitchen Two times a day as needed.    Tylenol 325 Mg Tabs (Acetaminophen) .Marland Kitchen... As needed    Adult Aspirin Ec Low Strength 81 Mg Tbec (Aspirin) .Marland Kitchen... Take 1 tablet by mouth once a day   Problem # 2:  CHEST PAIN (ICD-786.50) Assessment: New EKG--NSR reassured, no further tx needed at this time--doubt is cardiac in origin  Problem # 3:  DISTURBANCE OF SKIN SENSATION (ICD-782.0) Assessment: New suspect some of the numbness/tingling is positional suggested a Bcomplex daily re-eval in 1-2 mo if no improvement or sooner if worsens  Complete Medication List: 1)  Aleve 220 Mg Tabs (Naproxen sodium) .... Two times a day as needed. 2)  Lipitor 10 Mg Tabs (Atorvastatin calcium) .... One tab by mouth at night 3)  Tylenol 325 Mg Tabs (Acetaminophen) .... As needed 4)  Accuneb 0.63 Mg/47ml Nebu (Albuterol  sulfate) .... Use as needed for asthma from gerd 5)  Nasonex 50 Mcg/act Susp (Mometasone furoate) .Marland Kitchen.. 1 spray two times a day 6)  Adult Aspirin Ec Low Strength 81 Mg Tbec (Aspirin) .... Take 1 tablet by mouth once a day 7)  Zithromax Z-pak 250 Mg Tabs (Azithromycin) .... Use as directed     Prescriptions: ZITHROMAX Z-PAK 250 MG  TABS (AZITHROMYCIN) use as directed  #1 x 0   Entered and Authorized by:   Gildardo Griffes FNP   Signed by:   Gildardo Griffes FNP on 08/06/2007   Method used:   Print then Give to Patient   RxID:   [id number]  ] Prior Medications (reviewed today): ALEVE 220 MG  TABS (NAPROXEN SODIUM) two times a day as needed. LIPITOR 10 MG  TABS (ATORVASTATIN CALCIUM) one tab by mouth at night TYLENOL 325 MG  TABS (ACETAMINOPHEN) as needed ACCUNEB 0.63 MG/3ML  NEBU (ALBUTEROL SULFATE) use as needed for asthma from GERD NASONEX 50 MCG/ACT  SUSP (MOMETASONE FUROATE) 1 spray two times a day ADULT ASPIRIN EC LOW STRENGTH 81 MG  TBEC (ASPIRIN) Take 1 tablet by mouth once a day Current Allergies (reviewed today): ! SULFASALAZINE (SULFASALAZINE) ! GNP IODINE (IODINE) ! PENICILLIN V POTASSIUM (PENICILLIN V POTASSIUM) ! * BENAZINE ! * YEAST ! * DUST ! LATEX GLOVES (DISPOSABLE GLOVES) ! * BEE STINGS ! * MOLD ! * MILK

## 2010-06-14 NOTE — Letter (Signed)
Summary: [facility] - to undergo needle localized excisional biopsy  [facility]   Imported By: Lanelle Bal 03/15/2010 15:47:01  _____________________________________________________________________  External Attachment:    Type:   Image     Comment:   External Document

## 2010-06-14 NOTE — Consult Note (Signed)
Summary: [facility]/Consultation Report/Dr. [facility]/Consultation Report/Dr. Ophelia Charter   Imported By: Mickle Asper 05/02/2007 16:10:50  _____________________________________________________________________  External Attachment:    Type:   Image     Comment:   External Document

## 2010-06-14 NOTE — Miscellaneous (Signed)
Summary: Orders Update   Clinical Lists Changes  Orders: Added new Referral order of Radiology Referral (Radiology) - Signed 

## 2010-06-14 NOTE — Letter (Signed)
Summary: Results Follow up Letter  [place] at [facility]  [address] [place], [place] [address]   Phone: [phone]  Fax: [phone]    08/29/2007 MRN: [id number]  [place] [facility] [address] [place], [place]  [address]     Dear Ms. Hoge,    The following are the results of your recent test(s):  Test         Result    Pap Smear:        Normal _____  Not Normal _____ Comments: ______________________________________________________ Cholesterol: LDL(Bad cholesterol):         Your goal is less than:         HDL (Good cholesterol):       Your goal is more than: Comments:  ______________________________________________________ Mammogram:        Normal negative  Not Normal _____ Comments: Breast ultrasound also negative.  You do need to have f/u views of R) breast 7/09.  7/09.  ___________________________________________________________________ Hemoccult:        Normal _____  Not normal _______ Comments:    _____________________________________________________________________ Other Tests:    We routinely do not discuss normal results over the telephone.  If you desire a copy of the results, or you have any questions about this information we can discuss them at your next office visit.   Sincerely,    Billie Bean,FNP/[place],CMA

## 2010-06-14 NOTE — Letter (Signed)
Summary: Dr.Paul Juengel,[facility],Note  Dr.Paul Juengel,[facility],Note   Imported By: Beau Fanny 05/06/2009 11:05:21  _____________________________________________________________________  External Attachment:    Type:   Image     Comment:   External Document

## 2010-06-14 NOTE — Assessment & Plan Note (Signed)
Summary: CPX/RBH   Vital Signs:  Patient Profile:   65 Years Old Female Height:     65.25 inches Weight:      166 pounds Temp:     98.6 degrees F oral Pulse rate:   80 / minute Pulse rhythm:   regular BP sitting:   120 / 80  (left arm) Cuff size:   regular  Vitals Entered By: Providence Crosby (November 26, 2006 9:32 AM)               Chief Complaint:  CPX WITHOUT PAP.  History of Present Illness: Saw gyn last week, Dr Rosalio Macadamia, looking to get off Fosamax. No problems and not due Pap until next year. Has pain in "fatty pockets" of ant lateral abdomen. Has had significant constipation. takes fiber capsules regularly with prunes...Marland Kitchenapples typically gives indigestion. Does not exercise regularly. Hip bothers her with walking. Ankles swell also. Tried 1/2 Lipitor but today's results show back to whole.  Current Allergies: ! SULFASALAZINE (SULFASALAZINE) ! GNP IODINE (IODINE) ! PENICILLIN V POTASSIUM (PENICILLIN V POTASSIUM) ! * BENAZINE ! * YEAST ! * DUST ! LATEX GLOVES (DISPOSABLE GLOVES) ! * BEE STINGS ! * MOLD ! * MILK   Family History:    Father: Died 47 CA lung (smoker)      (00)    Mother: Alive 71  osteoporosis/lumbar spine, stroke x 5 @ 1 YOA, arthritis of hands, emphysema, valve regurg. forgetfulness    Siblings: None    CV:  (+) GF, (+) MGF palpitations and arrhythmia    HBP:  ? MGF    DM:  (-)    Lung CA:  (+) Father    ETOH:  PGF    Strokes:  + x 5 in mother, ? PGF   Risk Factors:  Passive smoke exposure:  no HIV high-risk behavior:  no Caffeine use:  1 drinks per day Exercise:  no   Review of Systems  General      Complains of chills.      Denies fatigue, fever, loss of appetite, malaise, sleep disorder, sweats, weakness, and weight loss.      weight gain  Eyes      Denies blurring, discharge, double vision, eye irritation, eye pain, halos, itching, light sensitivity, red eye, vision loss-1 eye, and vision loss-both eyes.  ENT      Denies  decreased hearing, difficulty swallowing, ear discharge, earache, hoarseness, nasal congestion, nosebleeds, postnasal drainage, ringing in ears, sinus pressure, and sore throat.      pain in ears bilat, checked by ENT  CV      Denies bluish discoloration of lips or nails, chest pain or discomfort, difficulty breathing at night, difficulty breathing while lying down, fainting, fatigue, leg cramps with exertion, lightheadness, near fainting, palpitations, shortness of breath with exertion, swelling of feet, swelling of hands, and weight gain.  Resp      Complains of cough and shortness of breath.      Denies chest discomfort, chest pain with inspiration, coughing up blood, excessive snoring, hypersomnolence, morning headaches, pleuritic, sputum productive, and wheezing.      improved  GI      Complains of constipation.      Denies abdominal pain, bloody stools, change in bowel habits, dark tarry stools, diarrhea, excessive appetite, gas, hemorrhoids, indigestion, loss of appetite, nausea, vomiting, vomiting blood, and yellowish skin color.      variable with IBS  GU      Complains of nocturia.  Denies abnormal vaginal bleeding, decreased libido, discharge, dysuria, genital sores, hematuria, incontinence, urinary frequency, and urinary hesitancy.      2-4/nite  MS      Complains of joint pain, joint swelling, low back pain, muscle aches, and muscle.      sees chiropracter weekly  Derm      Denies changes in color of skin, changes in nail beds, dryness, excessive perspiration, flushing, hair loss, insect bite(s), itching, lesion(s), poor wound healing, and rash.      sees Dr Jarold Motto.   Physical Exam  General:     Well-developed,well-nourished,in no acute distress; alert,appropriate and cooperative throughout examination Head:     Normocephalic and atraumatic without obvious abnormalities. No apparent alopecia or balding. Eyes:     Conjunctiva clear bilaterally.  Ears:      External ear exam shows no significant lesions or deformities.  Otoscopic examination reveals clear canals, tympanic membranes are intact bilaterally without bulging, retraction, inflammation or discharge. Hearing is grossly normal bilaterally. Nose:     External nasal examination shows no deformity or inflammation. Nasal mucosa are pink and moist without lesions or exudates. Mouth:     Oral mucosa and oropharynx without lesions or exudates.  Teeth in good repair. Neck:     No deformities, masses, or tenderness noted. Chest Wall:     No deformities, masses, or tenderness noted. Breasts:     per Gyn Lungs:     Normal respiratory effort, chest expands symmetrically. Lungs are clear to auscultation, no crackles or wheezes. Heart:     Normal rate and regular rhythm. S1 and S2 normal without gallop, murmur, click, rub or other extra sounds. Abdomen:     Bowel sounds positive,abdomen soft and non-tender without masses, organomegaly or hernias noted. Rectal:     per Gyn Genitalia:     per Gyn Msk:     No deformity or scoliosis noted of thoracic or lumbar spine.  Tender all over with Trigger points. Pulses:     R and L carotid,radial,femoral,dorsalis pedis and posterior tibial pulses are full and equal bilaterally Extremities:     No clubbing, cyanosis, edema, or deformity noted with normal full range of motion of all joints.   Neurologic:     No cranial nerve deficits noted. Station and gait are normal. Plantar reflexes are down-going bilaterally. DTRs are symmetrical throughout. Sensory, motor and coordinative functions appear intact. Skin:     Intact without suspicious lesions or rashes.  Abundant AKs. Cervical Nodes:     No lymphadenopathy noted Inguinal Nodes:     No significant adenopathy Psych:     Cognition and judgment appear intact. Alert and cooperative with normal attention span and concentration. No apparent delusions, illusions, hallucinations    Impression &  Recommendations:  Problem # 1:  HEALTH MAINTENANCE EXAM (ICD-V70.0) Assessment: Comment Only  Problem # 2:  ASTHMATIC BRONCHITIS, ACUTE (ICD-466.0) Assessment: Improved  Her updated medication list for this problem includes:    Accuneb 0.63 Mg/27ml Nebu (Albuterol sulfate) ..... Use as needed for asthma from gerd   Problem # 3:  IRRITABLE BOWEL SYNDROME (ICD-564.1) Assessment: Unchanged Stable, discussed constipation trmts.  Problem # 4:  HYPERCHOLESTEROLEMIA, PURE (ICD-272.0) Assessment: Deteriorated Back to whole pill. The following medications were removed from the medication list:    Zetia 10 Mg Tabs (Ezetimibe)  Her updated medication list for this problem includes:    Lipitor 10 Mg Tabs (Atorvastatin calcium) ..... One tab by mouth at night   Medications Added  to Medication List This Visit: 1)  Aleve 220 Mg Tabs (Naproxen sodium) .... Two times a day as needed. 2)  Lipitor 10 Mg Tabs (Atorvastatin calcium) .... One tab by mouth at night 3)  Accuneb 0.63 Mg/63ml Nebu (Albuterol sulfate) .... Use as needed for asthma from gerd   Patient Instructions: 1)  Try applesauce, all bran and prune juice for constipation and increase fiber. 2)  RTC one year for Comp Exam, Labs prior. 3)  RTC sooner as needed .

## 2010-06-16 NOTE — Progress Notes (Signed)
Summary: lipitor causing cramps  Phone Note Call from Patient Call back at Home Phone [phone] Call back at [phone]   Caller: Patient Call For: [name]  MD Summary of Call: Patient says that she has been taking the lipitor since jan. 1st. She was taken off of it after years of taking it because it caused her to have cramps in her legs. She then tried crestor which didn't work for her and was then put back on the lipitor. She says that she has had cramps in her legs everday since being back on it. She is asking if she can try something else.  [facility] [address]  Initial call taken by: Melody Comas,  June 06, 2010 11:18 AM  Follow-up for Phone Call        tried crestor and now lipitor.  trial of pravastatin 40mg  nightly.  who changed to crestor previously? Follow-up by: [name]  MD,  June 06, 2010 12:34 PM  Additional Follow-up for Phone Call Additional follow up Details #1::        Message left with patient's husband and on cell phone to return my call. Kim Dance CMA Duncan Dull)  June 06, 2010 1:57 PM   Spoke with patient and notified her of the Rx. She said Dr. Hetty Ely was the one who made the changes.  Additional Follow-up by: Janee Morn CMA Duncan Dull),  June 06, 2010 2:05 PM   New Allergies: LIPITOR * CRESTOR Additional Follow-up for Phone Call Additional follow up Details #2::    thanks.noted. Follow-up by: [name]  MD,  June 06, 2010 3:11 PM  New/Updated Medications: PRAVASTATIN SODIUM 40 MG TABS (PRAVASTATIN SODIUM) one by mouth nightly for cholesterol New Allergies: LIPITOR * CRESTORPrescriptions: PRAVASTATIN SODIUM 40 MG TABS (PRAVASTATIN SODIUM) one by mouth nightly for cholesterol  #30 x 3   Entered and Authorized by:   [name]  MD   Signed by:   [name]  MD on 06/06/2010   Method used:   [facility]        [facility]  [employer] [phone]* (retail)       [address] [place], [place]  [address]       Ph: [phone] or [phone]       Fax: [phone]   RxID:   [id number]

## 2010-07-03 ENCOUNTER — Emergency Department: Payer: Self-pay | Admitting: Emergency Medicine

## 2010-07-06 ENCOUNTER — Telehealth: Payer: Self-pay | Admitting: Family Medicine

## 2010-07-12 NOTE — Progress Notes (Signed)
Summary: stopped taking chol med   Phone Note Call from Patient Call back at [phone]   Caller: Patient Call For: [name]  MD Summary of Call: Patient says that the pravastatin was causing her leg cramps as well and then she has had a sinus infection. She says that she stopped taking the prevastatin 5 to 6 days ago because she couldn't take the cramps along with the sinus infection. She is asking if she could come in for labs to have her cholesterol checked to see if she could stay off meds for a while. If so how long does she need to wait before coming in. Please advise.  Initial call taken by: Melody Comas,  July 06, 2010 2:50 PM  Follow-up for Phone Call        may return in 1 mo for blood work and visit afterwards.  [FLP, 272.4] Follow-up by: [name]  MD,  July 07, 2010 8:27 AM  Additional Follow-up for Phone Call Additional follow up Details #1::        Patient notified and appts scheduled. Additional Follow-up by: Janee Morn CMA Duncan Dull),  July 07, 2010 10:01 AM

## 2010-07-28 LAB — GLUCOSE, CAPILLARY: Glucose-Capillary: 94 mg/dL (ref 70–99)

## 2010-07-28 LAB — POCT HEMOGLOBIN-HEMACUE: Hemoglobin: 13.9 g/dL (ref 12.0–15.0)

## 2010-08-04 ENCOUNTER — Other Ambulatory Visit: Payer: Self-pay

## 2010-08-10 ENCOUNTER — Other Ambulatory Visit: Payer: Self-pay | Admitting: Family Medicine

## 2010-08-10 DIAGNOSIS — E785 Hyperlipidemia, unspecified: Secondary | ICD-10-CM

## 2010-08-11 ENCOUNTER — Encounter: Payer: Self-pay | Admitting: Family Medicine

## 2010-08-11 ENCOUNTER — Other Ambulatory Visit (INDEPENDENT_AMBULATORY_CARE_PROVIDER_SITE_OTHER): Payer: Medicare Other | Admitting: Family Medicine

## 2010-08-11 ENCOUNTER — Ambulatory Visit: Payer: Self-pay | Admitting: Family Medicine

## 2010-08-11 DIAGNOSIS — E785 Hyperlipidemia, unspecified: Secondary | ICD-10-CM

## 2010-08-11 LAB — LIPID PANEL
Cholesterol: 240 mg/dL — ABNORMAL HIGH (ref 0–200)
HDL: 59.3 mg/dL (ref >39.00)
Total CHOL/HDL Ratio: 4
Triglycerides: 136 mg/dL (ref 0.0–149.0)
VLDL: 27.2 mg/dL (ref 0.0–40.0)

## 2010-08-11 LAB — LDL CHOLESTEROL, DIRECT: Direct LDL: 158.9 mg/dL

## 2010-08-18 ENCOUNTER — Encounter: Payer: Self-pay | Admitting: Family Medicine

## 2010-08-18 ENCOUNTER — Ambulatory Visit (INDEPENDENT_AMBULATORY_CARE_PROVIDER_SITE_OTHER): Payer: Federal, State, Local not specified - PPO | Admitting: Family Medicine

## 2010-08-18 VITALS — BP 118/74 | HR 74 | Temp 98.1°F | Wt 167.1 lb

## 2010-08-18 DIAGNOSIS — J381 Polyp of vocal cord and larynx: Secondary | ICD-10-CM

## 2010-08-18 DIAGNOSIS — F411 Generalized anxiety disorder: Secondary | ICD-10-CM | POA: Insufficient documentation

## 2010-08-18 DIAGNOSIS — E7800 Pure hypercholesterolemia, unspecified: Secondary | ICD-10-CM

## 2010-08-18 DIAGNOSIS — F410 Panic disorder [episodic paroxysmal anxiety] without agoraphobia: Secondary | ICD-10-CM

## 2010-08-18 MED ORDER — ATORVASTATIN CALCIUM 10 MG PO TABS: 10.0000 mg | ORAL_TABLET | Freq: Every day | ORAL | Status: DC

## 2010-08-18 MED ORDER — COENZYME Q10 100 MG PO CAPS: 100.0000 mg | ORAL_CAPSULE | Freq: Every day | ORAL | Status: DC

## 2010-08-18 MED ORDER — PAROXETINE HCL 10 MG PO TABS: 10.0000 mg | ORAL_TABLET | Freq: Every day | ORAL | Status: DC

## 2010-08-18 MED ORDER — ALPRAZOLAM 0.25 MG PO TABS: 0.2500 mg | ORAL_TABLET | Freq: Two times a day (BID) | ORAL | Status: DC | PRN

## 2010-08-18 NOTE — Assessment & Plan Note (Addendum)
Situational (stress with mother). Discussed coping strategies, treatment including pharmacotherapy and counseling.  Discussed increased exercise and deep breathing relaxation techniques. Pt feels has good support at home so not interested in counseling, interested in pharmacotherapy. Will start xanax temporarily and start paxil daily. Discussed precautions with xanax, paxil. rtc 1-2 mo for f/u.

## 2010-08-18 NOTE — Assessment & Plan Note (Signed)
States intolerant of generic atorvastatin, pravastatin.  crestor without myalgia but didn't achieve goal of <100 LDL.  + Fmhx CVA (mother).  No personal h/o CAD.   Agrees to restart lipitor brand (as has tolerated this in past) and will also start coenzyme Q hopeful for improved muscle tolerance.

## 2010-08-18 NOTE — Progress Notes (Signed)
  Subjective:    Patient ID: Ashley [name], female    DOB: 1946-03-23, 65 y.o.   MRN: [id number]  HPI CC: f/u issues  1. HLD - has been on lipitor, crestor in past.  Then pravastatin.  All caused leg cramps.  States tolerated crestor fine but didn't bring LDL to desired range so changed back to lipitor.  Sometimes tolerates fine, sometimes causes leg cramps.  States brand lipitor tolerated well.  Generic lipitor caused myalgia.  Would be interested in trying statin again (lipitor).  2. Anxiety - mother with significant health issues, recently in hospital for 1 week.  currently in SNF with PNA and on O2 and with CHF.  Hospice to become involved.  Feels getting anxiety attacks more frequently and feeling overwhelmed.  No relief in sight.  Doesn't have time to find healthy ways to relieve stress.  Takes care of grandchildren.  Does feel has good support in form of close friends she talks to.  Not interested in counseling.  Interested in short term relief of anxiety.  No h/o HTN, never smoked but + 2nd hand smoke exposure hx.  Father died of lung cancer.  Heart feels "fuzzy", no pressure/tightness.  No SOB, not exhertional.  Attributes to stress.  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  Bad hoarseness, states has been on prednisone.  States plan is for voice rest but difficult to do.  Swelling in throat.   4. weight - has cut back on sodas, increasing water.  Weight stable.  5. Breast lump - 02/2010.  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  rec rpt mammo 1 year (02/2011).  Preventative -  mammo - see above.  Due 02/2011. Well woman done at [facility] Dr. Adele Schilder.  paps all normal. Colonoscopy - states recommended against repeating by GI.  iFOB negative 03/29/2010. No shingles shot, declines.  No flu shot, declines.  Review of Systems Per HPI    Objective:   Physical Exam  Constitutional: She appears well-developed and well-nourished. No distress.  HENT:    Head: Normocephalic and atraumatic.  Mouth/Throat: Oropharynx is clear and moist. No oropharyngeal exudate.  Eyes: Conjunctivae and EOM are normal. Pupils are equal, round, and reactive to light. No scleral icterus.  Neck: Normal range of motion. Neck supple. No thyromegaly present.  Cardiovascular: Normal rate, regular rhythm, normal heart sounds and intact distal pulses.   No murmur heard. Pulmonary/Chest: Effort normal and breath sounds normal. No respiratory distress. She has no wheezes. She has no rales.  Lymphadenopathy:    She has no cervical adenopathy.  Skin: Skin is warm and dry. No rash noted.  Psychiatric: She has a normal mood and affect. Her behavior is normal. Judgment and thought content normal.          Assessment & Plan:

## 2010-08-18 NOTE — Assessment & Plan Note (Signed)
With hoarse voice.   Followed by Dr. Thelma Comp.

## 2010-08-18 NOTE — Patient Instructions (Addendum)
Start Paxil daily for anxiety (daily medicine, will take a few weeks to take effect). In meantime, start alprazolam (xanax) for short term relief of anxiety.  Use as needed.  May start with 1/2 pill. If anxiety not getting better, we may set you up with counseling.  Continue to work on healthy ways to relieve stress. We will restart lipitor 10mg  brand daily.  May also start Co Enzyme Q daily to help prevent muscle aches. Return in 1-2 months for follow up, sooner if worsening.

## 2010-08-22 LAB — POCT HEMOGLOBIN-HEMACUE: Hemoglobin: 11.3 g/dL — ABNORMAL LOW (ref 12.0–15.0)

## 2010-08-26 ENCOUNTER — Ambulatory Visit (INDEPENDENT_AMBULATORY_CARE_PROVIDER_SITE_OTHER): Payer: Federal, State, Local not specified - PPO | Admitting: Family Medicine

## 2010-08-26 ENCOUNTER — Encounter: Payer: Self-pay | Admitting: Family Medicine

## 2010-08-26 VITALS — BP 128/76 | HR 64 | Temp 98.4°F | Wt 169.0 lb

## 2010-08-26 DIAGNOSIS — R109 Unspecified abdominal pain: Secondary | ICD-10-CM | POA: Insufficient documentation

## 2010-08-26 LAB — POCT URINALYSIS DIPSTICK
Glucose, UA: NEGATIVE
Ketones, UA: NEGATIVE
Leukocytes, UA: NEGATIVE
Nitrite, UA: NEGATIVE
Spec Grav, UA: 1.02
Urobilinogen, UA: 0.2
pH, UA: 6

## 2010-08-26 LAB — CBC WITH DIFFERENTIAL/PLATELET
Basophils Absolute: 0.1 10*3/uL (ref 0.0–0.1)
Basophils Relative: 0.5 % (ref 0.0–3.0)
Eosinophils Absolute: 0.1 10*3/uL (ref 0.0–0.7)
Eosinophils Relative: 0.5 % (ref 0.0–5.0)
HCT: 36.1 % (ref 36.0–46.0)
Hemoglobin: 12.3 g/dL (ref 12.0–15.0)
Lymphocytes Relative: 14.1 % (ref 12.0–46.0)
Lymphs Abs: 2.2 10*3/uL (ref 0.7–4.0)
MCHC: 34 g/dL (ref 30.0–36.0)
MCV: 89.1 fl (ref 78.0–100.0)
Monocytes Absolute: 1.3 10*3/uL — ABNORMAL HIGH (ref 0.1–1.0)
Monocytes Relative: 8.1 % (ref 3.0–12.0)
Neutro Abs: 12 10*3/uL — ABNORMAL HIGH (ref 1.4–7.7)
Neutrophils Relative %: 76.8 % (ref 43.0–77.0)
Platelets: 242 10*3/uL (ref 150.0–400.0)
RBC: 4.05 Mil/uL (ref 3.87–5.11)
RDW: 12.5 % (ref 11.5–14.6)
WBC: 15.7 10*3/uL — ABNORMAL HIGH (ref 4.5–10.5)

## 2010-08-26 LAB — COMPREHENSIVE METABOLIC PANEL
ALT: 30 U/L (ref 0–35)
AST: 19 U/L (ref 0–37)
Albumin: 3.5 g/dL (ref 3.5–5.2)
Alkaline Phosphatase: 143 U/L — ABNORMAL HIGH (ref 39–117)
BUN: 17 mg/dL (ref 6–23)
CO2: 28 mEq/L (ref 19–32)
Calcium: 9.6 mg/dL (ref 8.4–10.5)
Chloride: 102 mEq/L (ref 96–112)
Creatinine, Ser: 0.7 mg/dL (ref 0.4–1.2)
GFR: 92.27 mL/min (ref >60.00)
Glucose, Bld: 82 mg/dL (ref 70–99)
Potassium: 4.4 mEq/L (ref 3.5–5.1)
Sodium: 142 mEq/L (ref 135–145)
Total Bilirubin: 0.5 mg/dL (ref 0.3–1.2)
Total Protein: 7 g/dL (ref 6.0–8.3)

## 2010-08-26 LAB — POC HEMOCCULT BLD/STL (OFFICE/1-CARD/DIAGNOSTIC): Fecal Occult Blood, POC: NEGATIVE

## 2010-08-26 MED ORDER — CIPROFLOXACIN HCL 500 MG PO TABS: 500.0000 mg | ORAL_TABLET | Freq: Two times a day (BID) | ORAL | Status: AC

## 2010-08-26 NOTE — Assessment & Plan Note (Addendum)
Recent abd pain, BM changes.  No fevers.  tolerating PO and passing gas/BMs.  UA with tr blood, protein, small bili.  Sent UCx.  Hemoccult neg for blood. In setting of pt with IBS, diverticulosis on colonoscopy 2005, recently starting new meds (paxil, xanax, coenzyme Q) all which could cause abd pain. Alternatively could be gallbladder as associated RUQ pain and bloating.  If not better next week, set up with abd [place]. Stop all new meds.  CLD for next few days then ADAT to bland.   Check blood work today to eval LFTs and WBC. Discussed red flags to seek [facility] over weekend.

## 2010-08-26 NOTE — Progress Notes (Signed)
  Subjective:    Patient ID: Ashley [name], female    DOB: 12-28-1945, 65 y.o.   MRN: [id number]  HPI CC: GI issues  Recently started paxil, xanax and coenzyme Q.  2 hours after these new meds, started with diarrhea described as light brown with mucous and went 4-6 times, lasted 1 day, no blood or black tarry stool.  After that soft stools or no stool.  Last BM yesterday.  Very gassy and feels bloated.  Soreness started after diarrhea.  Throughout abdomen, mainly lower part, goes through to rectum and back.  Trouble with sitting and walking 2/2 soreness.  Has continued paxil for 5 days.  This has been going on 1+ week.  Had been taking paxil as well as xanax prn both 1/2 pill.  Feels pressure building up.  Feels sore on inside.  H/o hemorrhoids but doesn't think this is where discomfort is coming from.    No fevers.  + chills since October after excisional breast biopsy.  No changes in food, no sick contacts, no recent travel.  No nausea/vomiting.  Staying well hydrated with plenty of water.  + pressure with voiding, no dysuria, urgency, frequency.  + stool urgency but no bowel/bladder accidents.  H/o appendectomy and complete hysterectomy.    iFOB 03/2010 negative. H/o diverticulosis found on colonoscopy 2005, rec against repeating Juanda Chance) because large intestine walls were very thin. H/o IBS  Medications and allergies reviewed and updated as above. PMhx, SurgHx, SHx reviewed for relevance.  Review of Systems Per HPI    Objective:   Physical Exam  Vitals reviewed. Constitutional: She appears well-developed and well-nourished. No distress (some discomfort with position changes).  HENT:  Head: Normocephalic and atraumatic.  Mouth/Throat: Oropharynx is clear and moist. No oropharyngeal exudate.  Cardiovascular: Normal rate, regular rhythm and normal heart sounds.   No murmur heard. Pulmonary/Chest: Effort normal and breath sounds normal. No respiratory distress. She has no wheezes.  She has no rales.  Abdominal: Soft. She exhibits distension. She exhibits no abdominal bruit and no mass. Bowel sounds are increased. There is no hepatosplenomegaly. There is tenderness in the right upper quadrant and left lower quadrant. There is no rigidity, no rebound, no guarding, no CVA tenderness and negative Murphy's sign.       Tenderness mild-moderate  Genitourinary: Rectal exam shows external hemorrhoid (minor x2). Rectal exam shows no internal hemorrhoid, no fissure, no mass, no tenderness and anal tone normal. Guaiac negative stool.  Skin: Skin is warm. No rash noted.  Psychiatric: She has a normal mood and affect.       Assessment & Plan:

## 2010-08-26 NOTE — Patient Instructions (Addendum)
This may be diverticulitis - start antibiotic twice daily for 10 days.  Push fluids, change diet to clear liquid diet for next 1-2 days then slowly advance diet to bland foods (rice, applesauce, toast, etc).  Stay away from seeds/nuts. Blood work today. STOP paxil.  STOP co enzyme Q.  STOP alprazolam (all the new medicines.)  Hold lipitor. Return next week for follow up if needed.  We will obtain imaging (ultrasound) if not improving with above treatment. If fevers >101, worsening pain, you may ned to seek care over weekend.

## 2010-08-28 LAB — URINE CULTURE: Colony Count: 8000

## 2010-08-29 ENCOUNTER — Telehealth: Payer: Self-pay | Admitting: *Deleted

## 2010-08-29 DIAGNOSIS — R109 Unspecified abdominal pain: Secondary | ICD-10-CM

## 2010-08-29 NOTE — Telephone Encounter (Signed)
Patient says that she is still having stomach pain, still having diarrhea. She is asking if she could go ahead and get referral for the ultrasound. Prefers going to [place] and also would is asking to go this Thursday.

## 2010-08-30 MED ORDER — METRONIDAZOLE 500 MG PO TABS: 500.0000 mg | ORAL_TABLET | Freq: Three times a day (TID) | ORAL | Status: AC

## 2010-08-30 NOTE — Telephone Encounter (Signed)
Will set up with [place] - order in chart.  Will add Flagyl 500mg  tid x 10 days to cover presumed diverticulitis, [place] to eval gallstones.  Will also send note to [facility].  Pl notify flagyl has been sent in. Ensure pt has backed down to clear liquid diet for a few days and slowly advancing to bland diet.

## 2010-08-30 NOTE — Telephone Encounter (Signed)
Message left notifying patient of referral and to expect call from [facility]. Advised to pick up new med from pharmacy and to utilize clear liquid diet and slowly advance to bland diet. Instructed her to call with any questions or concerns.

## 2010-09-01 ENCOUNTER — Telehealth: Payer: Self-pay | Admitting: Family Medicine

## 2010-09-01 ENCOUNTER — Ambulatory Visit: Payer: Self-pay | Admitting: Family Medicine

## 2010-09-01 DIAGNOSIS — R109 Unspecified abdominal pain: Secondary | ICD-10-CM

## 2010-09-01 NOTE — Assessment & Plan Note (Addendum)
ALP elevated to 140s, WBC elevated to 15k.  Currently on flagyl, cipro.  ABD US showing liver, spleen, pancreas, AA and IVC overall normal.  ?mild nonspecific pancreatic duct?  No gallstones.  Add lipase to blood in lab if possible.  L kidney cyst, 9.4cm.  Will obtain CT scan to better eval pancreas, bile duct, diverticulosis.  Cr stable.

## 2010-09-01 NOTE — Telephone Encounter (Signed)
Can we call for update and notify us looking ok no gallstones, normal pancreas - if pain not improved on cipro/flagyl, will recommend setting patient up with CT scan to eval for diverticulitis vs referral to GI for further evaluation.

## 2010-09-02 ENCOUNTER — Other Ambulatory Visit: Payer: Federal, State, Local not specified - PPO

## 2010-09-02 DIAGNOSIS — R109 Unspecified abdominal pain: Secondary | ICD-10-CM

## 2010-09-02 NOTE — Telephone Encounter (Signed)
Patient states she doesn't have nearly as much pressure as before and that she feels better in some ways and not better in others. She did say her BM's are still very painful and her rectum is still sore on the inside and it is painful to sit directly on that area. She said that she just generally doesn't feel good at all. She said the the tech yesterday said it looked as if she had "a bunch of stool under her breast bone" and she wanted to know if she should drink prune juice. She said that she has been told her "gut walls are very thin" and she doesn't want to do anything to bust them open. I told her I would ask your opinion on what she needed to do for that. I also told her that you were considering a CT vs. GI referral. She said she has seen Dr. [name] in the past and she would want to see her again if referral was necessary.

## 2010-09-02 NOTE — Telephone Encounter (Signed)
Message left on home and cell for patient to return my call.

## 2010-09-02 NOTE — Telephone Encounter (Signed)
i'm glad she's feeling somewhat better.  Please notify I'd like to set her up with CT scan.  Just recommend increasing water.  If diverticulitis, want to avoid increasing fiber so hold prune juice for now.  i would also like [place] to set her up with GI f/u for concern for colitis/diverticulitis.  Can she call for appt or do I need to place order in chart?  Has seen Dr. Juanda Chance in past.

## 2010-09-02 NOTE — Telephone Encounter (Signed)
Message left for patient to return my call.  

## 2010-09-03 LAB — LIPASE: Lipase: 16 U/L (ref 0–75)

## 2010-09-05 ENCOUNTER — Encounter: Payer: Self-pay | Admitting: Family Medicine

## 2010-09-06 ENCOUNTER — Telehealth: Payer: Self-pay | Admitting: *Deleted

## 2010-09-06 NOTE — Telephone Encounter (Signed)
Pt called to let you know that she cancelled her abd CT scheduled for this week because she is allergic to iodine.  She said she is feeling better and will be going on vacation in may.  If you want her to have further testing it will have to wait until she has more leisure time.

## 2010-09-06 NOTE — Telephone Encounter (Signed)
Noted.  Would still like her to f/u with GI for concern for colitis/diverticulitis.  Can she call for appt or do I need to place order in chart? Has seen Dr. Juanda Chance in past.

## 2010-09-06 NOTE — Telephone Encounter (Signed)
Noted. Thanks.

## 2010-09-06 NOTE — Telephone Encounter (Signed)
Patient called [name] and cancelled the CT Scan that you set up for her. She told them that she was allergic to Iodine so could not have IV contrast and also that  the Barium makes her sick. She has a trip to [place] mid May and doesn't want to be sick from this.  Rose from CT says that she should have it at the hospital anyway as she needs to be pre-medicated.

## 2010-09-06 NOTE — Telephone Encounter (Signed)
According to another note in patient's chart she wanted to wait until may to have CT since she is allergic to IVP dye and barium. She has vacation planned and doesn't want to be sick for vacation.

## 2010-09-06 NOTE — Telephone Encounter (Signed)
Advised pt.  She will call GI to set up appt after her trip, will call here for referral if she has any problems making the appt.  Advised her that since she is feeling better there is no rush to go before her trip, but that Dr. Reece Agar does want her to follow up with GI.

## 2010-09-06 NOTE — Telephone Encounter (Signed)
Noted.  If feeling better, may wait for imaging.  Would like her set up again with GI.  Would like her to call to make appt.  If unable to make appt we can refer her there.

## 2010-09-08 ENCOUNTER — Other Ambulatory Visit: Payer: Federal, State, Local not specified - PPO

## 2010-09-27 NOTE — Op Note (Signed)
NAME:  [name], Ashley [name]           ACCOUNT NO.:  [id number]   MEDICAL RECORD NO.:  [id number]          PATIENT TYPE:  AMB   LOCATION:  [facility]                          FACILITY:  [facility]   PHYSICIAN:  Leonides Grills, M.D.     DATE OF BIRTH:  May 02, 1946   DATE OF PROCEDURE:  11/04/2008  DATE OF DISCHARGE:                               OPERATIVE REPORT   PREOPERATIVE DIAGNOSIS:  Right hallux valgus.   POSTOPERATIVE DIAGNOSIS:  Right hallux valgus.   OPERATIONS:  1. Right Chevron bunionectomy.  2. Right great toe digital nerve neurolysis.  3. Stress x-rays, right foot.   ANESTHESIA:  General.   SURGEON:  Leonides Grills, MD   ASSISTANT:  Richardean Canal, PA-C   ESTIMATED BLOOD LOSS:  Minimal.   TOURNIQUET TIME:  Approximately 40 minutes.   COMPLICATIONS:  None.   DISPOSITION:  Stable to PR.   INDICATIONS:  This is a 65 year old female who has had longstanding  right bunion pain that was interfering with her life.  Once the  condition was achieved, she was consented for the above procedure.  All  risks of infection, neuro vessel injury, nonunion, malunion, hardware  tissue and hardware failure, persistent pain and worsening pain,  prolonged recovery, stiffness, arthritis, recurrence of hallux valgus,  and development of hallux varus were all explained.  Questions were  encouraged and answered.   OPERATION:  The patient was brought to the operating room and placed in  supine position.  After adequate general endotracheal anesthesia was  administered as well as Ancef 1 g IV piggyback.  Right lower extremity  was then prepped and draped in sterile manner over proximally to thigh  tourniquet.  Limb was gravity exsanguinated.  Tourniquet was elevated  290 mmHg.  A longitudinal incision midline over the medial aspect of  right great toe MTP joint was then made.  Dissection was carried down  through skin.  Hemostasis was obtained.  The dorsomedial digital nerve  was carefully dissected out  in a bursa.  Once this was freed from the  bursa, which encased the nerve, formal digital nerve neurolysis was  performed.  This retracted out of harm's way throughout the case.  L-  shaped capsulotomy was then made.  The medial portion of the eminence of  the first metatarsal was then shaved off with a sagittal saw in the  sagittal plane.  Rocky Link Johnson's ridge was then rounded off with the  rongeur.  The area and joint were copiously irrigated with normal  saline.  Lateral capsule was then released with curved Beaver blade  protecting the cartilaginous surface of Therapist, nutritional.  This had an  excellent release tight capsule.  Center of the head was then  identified.  Soft tissue was protected both superiorly and inferiorly,  and then with a sagittal saw a Chevron osteotomy was then made.  Head  was then translated about 3-4 mm laterally and then fixed with a 2.0-mm  fully-threaded cortical set screw using 1.5-mm drill hole respectively.  This had excellent purchase and maintenance of the correction.  The  redundant bone medially was trimmed off with  a sagittal saw.  The joint  and area copiously irrigated with normal saline.  The medial capsule was  then reconstructed and advanced both superiorly and proximally, locating  the sesamoids using a 2-0 Vicryl stitch and has had an outstanding  repair.  Range of motion of toe after procedure was excellent.  Stress x-  rays were obtained in the AP and lateral planes, which showed no gross  motion fixation and proposition excellent alignment as well.  Sesamoids  well located.  Tourniquet was inflated and hemostasis was obtained.  There was no pulsatile bleeding.  Skin was closed with 4-0 nylon stitch.  Sterile dressing was applied.  Roger Mann dressing was applied.  Hard  sole shoe was applied.  Patient stable to PR.      Leonides Grills, M.D.  Electronically Signed     PB/MEDQ  D:  11/04/2008  T:  11/04/2008  Job:  [id number]

## 2010-11-01 ENCOUNTER — Encounter: Payer: Self-pay | Admitting: Family Medicine

## 2010-11-01 ENCOUNTER — Ambulatory Visit (INDEPENDENT_AMBULATORY_CARE_PROVIDER_SITE_OTHER): Payer: Medicare Other | Admitting: Family Medicine

## 2010-11-01 VITALS — BP 110/80 | HR 72 | Temp 98.6°F | Wt 165.0 lb

## 2010-11-01 DIAGNOSIS — R55 Syncope and collapse: Secondary | ICD-10-CM

## 2010-11-01 DIAGNOSIS — R5381 Other malaise: Secondary | ICD-10-CM

## 2010-11-01 DIAGNOSIS — R109 Unspecified abdominal pain: Secondary | ICD-10-CM

## 2010-11-01 DIAGNOSIS — E7800 Pure hypercholesterolemia, unspecified: Secondary | ICD-10-CM

## 2010-11-01 DIAGNOSIS — R413 Other amnesia: Secondary | ICD-10-CM

## 2010-11-01 DIAGNOSIS — J3489 Other specified disorders of nose and nasal sinuses: Secondary | ICD-10-CM

## 2010-11-01 DIAGNOSIS — R5383 Other fatigue: Secondary | ICD-10-CM

## 2010-11-01 DIAGNOSIS — R0981 Nasal congestion: Secondary | ICD-10-CM

## 2010-11-01 LAB — COMPREHENSIVE METABOLIC PANEL
ALT: 40 U/L — ABNORMAL HIGH (ref 0–35)
AST: 35 U/L (ref 0–37)
Albumin: 4.2 g/dL (ref 3.5–5.2)
Alkaline Phosphatase: 110 U/L (ref 39–117)
BUN: 19 mg/dL (ref 6–23)
CO2: 29 mEq/L (ref 19–32)
Calcium: 10 mg/dL (ref 8.4–10.5)
Chloride: 108 mEq/L (ref 96–112)
Creatinine, Ser: 0.8 mg/dL (ref 0.4–1.2)
GFR: 79.89 mL/min (ref >60.00)
Glucose, Bld: 82 mg/dL (ref 70–99)
Potassium: 4.5 mEq/L (ref 3.5–5.1)
Sodium: 144 mEq/L (ref 135–145)
Total Bilirubin: 0.5 mg/dL (ref 0.3–1.2)
Total Protein: 7.3 g/dL (ref 6.0–8.3)

## 2010-11-01 LAB — CBC WITH DIFFERENTIAL/PLATELET
Basophils Absolute: 0.1 10*3/uL (ref 0.0–0.1)
Basophils Relative: 0.6 % (ref 0.0–3.0)
Eosinophils Absolute: 0.1 10*3/uL (ref 0.0–0.7)
Eosinophils Relative: 1.2 % (ref 0.0–5.0)
HCT: 38.4 % (ref 36.0–46.0)
Hemoglobin: 12.9 g/dL (ref 12.0–15.0)
Lymphocytes Relative: 21 % (ref 12.0–46.0)
Lymphs Abs: 2.4 10*3/uL (ref 0.7–4.0)
MCHC: 33.6 g/dL (ref 30.0–36.0)
MCV: 89.1 fl (ref 78.0–100.0)
Monocytes Absolute: 0.9 10*3/uL (ref 0.1–1.0)
Monocytes Relative: 8.2 % (ref 3.0–12.0)
Neutro Abs: 7.9 10*3/uL — ABNORMAL HIGH (ref 1.4–7.7)
Neutrophils Relative %: 69 % (ref 43.0–77.0)
Platelets: 222 10*3/uL (ref 150.0–400.0)
RBC: 4.31 Mil/uL (ref 3.87–5.11)
RDW: 13.6 % (ref 11.5–14.6)
WBC: 11.4 10*3/uL — ABNORMAL HIGH (ref 4.5–10.5)

## 2010-11-01 LAB — LIPID PANEL
Cholesterol: 229 mg/dL — ABNORMAL HIGH (ref 0–200)
HDL: 53.4 mg/dL (ref >39.00)
Total CHOL/HDL Ratio: 4
Triglycerides: 139 mg/dL (ref 0.0–149.0)
VLDL: 27.8 mg/dL (ref 0.0–40.0)

## 2010-11-01 LAB — LDL CHOLESTEROL, DIRECT: Direct LDL: 149.2 mg/dL

## 2010-11-01 LAB — TSH: TSH: 0.63 u[IU]/mL (ref 0.35–5.50)

## 2010-11-01 LAB — VITAMIN B12: Vitamin B-12: 344 pg/mL (ref 211–911)

## 2010-11-01 LAB — CK: Total CK: 162 U/L (ref 7–177)

## 2010-11-01 MED ORDER — PREDNISONE 20 MG PO TABS: ORAL_TABLET | ORAL | Status: DC

## 2010-11-01 NOTE — Progress Notes (Signed)
  Subjective:    Patient ID: Ashley [name], female    DOB: December 01, 1945, 65 y.o.   MRN: [id number]  HPI CC: f/u visit  HLD - lipitor 10mg . Endorse myalgias.  Not currently on CoQ10. Lab Results  Component Value Date   LDLCALC 108* 12/30/2009   abd issues - seen 2 mo ago with pressure and pain with BMs, change in BMs.  WBC 15.7, CMP and lipase WNL.  Treated for presumed divericulitis with cipro/flagyl.  Unable to obtain outpatient CT 2/2 IV contrast allergy.  Recommended f/u with GI, has not set up f/u appt, has seen Dr. Juanda Chance in past.  Did improve.  States continued bloating, continued LLQ pain, mild.    H/o appendectomy and complete hysterectomy.  iFOB 03/2010 negative.  H/o diverticulosis found on colonoscopy 2005, rec against repeating Juanda Chance) because large intestine walls were very thin.  H/o IBS, fibromyalgia.  [place] good time.  Syncopal episode - 10/12/2010.  Felt blacking out on 2nd plane trip back home.  Had had to rush to flight.  Was in air all night.  Out for a few seconds.  Came to right away.  cbg not checked.  H/o fainting in past, states has had low sugars in past.  No chest pain, SOB, cough.  Felt very fatigued when fainted.  Feels getting stronger now.  No more LOC since then.    Next day started with bronchitis sxs.  Prior to plane ride had been placed on prednisone by allergist (Dr. Thelma Comp), started on ventolin, started on levaquin after returned from flight.  No h/o asthma but feels like having asthma cough.  Denies wheezing.  Finished levaquin and steroids around same time about 1 wk ago.  Using robitussin DM for cough.  Worried because continues to bring up sputum, thought should be feeling better by now.  Also endorsing fatigue and tired at all times.  Notices losing memory occasionally.  Review of Systems Per HPI    Objective:   Physical Exam  Nursing note and vitals reviewed. Constitutional: She appears well-developed and well-nourished.  No distress.       Congested  HENT:  Head: Normocephalic and atraumatic.  Right Ear: Hearing, tympanic membrane, external ear and ear canal normal.  Left Ear: Hearing, tympanic membrane, external ear and ear canal normal.  Nose: Mucosal edema present. No rhinorrhea. Right sinus exhibits no maxillary sinus tenderness and no frontal sinus tenderness. Left sinus exhibits no maxillary sinus tenderness and no frontal sinus tenderness.  Mouth/Throat: Oropharynx is clear and moist. No oropharyngeal exudate.  Eyes: Conjunctivae and EOM are normal. Pupils are equal, round, and reactive to light. No scleral icterus.  Neck: Neck supple.  Cardiovascular: Normal rate, regular rhythm, normal heart sounds and intact distal pulses.   No murmur heard. Pulmonary/Chest: Effort normal and breath sounds normal. No respiratory distress. She has no wheezes. She has no rales.  Abdominal: Soft. Normal appearance and bowel sounds are normal. She exhibits no distension. There is tenderness (mildly diffuse). There is no rebound and no guarding.  Lymphadenopathy:    She has no cervical adenopathy.          Assessment & Plan:

## 2010-11-01 NOTE — Patient Instructions (Addendum)
Call to schedule appointment with Dr. Juanda Chance, call us if unable to get appointment. I think you still have sinus congestion but don't think there's any current infection, levaquin should have treated infection well. Continue nasal saline spray, start albuterol treatments regularly for next 2-3 days. Continue robitussin DM for now. Short course of steroids to help with cough. Things to watch out for - fever >101, worsening cough or shortness of breath. Could be flare of fibromyalgia as well.  We will continue to monitor. Blood work today.

## 2010-11-02 DIAGNOSIS — R55 Syncope and collapse: Secondary | ICD-10-CM | POA: Insufficient documentation

## 2010-11-02 DIAGNOSIS — R5382 Chronic fatigue, unspecified: Secondary | ICD-10-CM | POA: Insufficient documentation

## 2010-11-02 DIAGNOSIS — R0981 Nasal congestion: Secondary | ICD-10-CM | POA: Insufficient documentation

## 2010-11-02 DIAGNOSIS — R5381 Other malaise: Secondary | ICD-10-CM | POA: Insufficient documentation

## 2010-11-02 NOTE — Assessment & Plan Note (Signed)
Anticipate combination of fatigue, dehydration, and beginning of illness (bronchtiis) contributing.   If any other episode, pursue formal eval.

## 2010-11-02 NOTE — Assessment & Plan Note (Signed)
2 mo ago treated for presumed diverticulitis/colitis in setting of WBC 15k and elevated ALP to 140s with cipro/flagyl, improved. However continued LLQ and generalized abd pain, BM changes and bloating.  Allergy to IV dye. Per pt has been counseled agains repeating colonoscopy. F/u with GI.

## 2010-11-02 NOTE — Assessment & Plan Note (Signed)
Check blood work to r/o reversible causes. ?fibro flare.

## 2010-11-02 NOTE — Assessment & Plan Note (Signed)
doubt current infection.  Short course of steroids to help with presumed cough from bronchospasm as well as sinus congestion. Hold nasonex until off oral steroids.  See pt instructions for further instructions. Update [place] if any worseneing or red flags.

## 2010-11-03 ENCOUNTER — Ambulatory Visit: Payer: Medicare Other | Admitting: Family Medicine

## 2010-11-03 ENCOUNTER — Ambulatory Visit: Payer: Federal, State, Local not specified - PPO | Admitting: Family Medicine

## 2010-11-23 ENCOUNTER — Encounter: Payer: Self-pay | Admitting: *Deleted

## 2010-11-23 ENCOUNTER — Telehealth: Payer: Self-pay | Admitting: Internal Medicine

## 2010-11-23 NOTE — Telephone Encounter (Signed)
Patient states 2 months ago, she had constipation that "cleared up" Now she is having large bowel movements and bloating. Wants to schedule a visit as directed by her PCP.( She saw her PCP and they recommend GI f/u) Schedule patient to see Dr. Juanda Chance on 12/05/10 at 2:00 PM. Letter mailed

## 2010-12-05 ENCOUNTER — Encounter: Payer: Self-pay | Admitting: Internal Medicine

## 2010-12-05 ENCOUNTER — Ambulatory Visit (INDEPENDENT_AMBULATORY_CARE_PROVIDER_SITE_OTHER): Payer: Medicare Other | Admitting: Internal Medicine

## 2010-12-05 VITALS — BP 134/82 | HR 80 | Ht 65.0 in | Wt 169.0 lb

## 2010-12-05 DIAGNOSIS — K5730 Diverticulosis of large intestine without perforation or abscess without bleeding: Secondary | ICD-10-CM

## 2010-12-05 DIAGNOSIS — K219 Gastro-esophageal reflux disease without esophagitis: Secondary | ICD-10-CM

## 2010-12-05 NOTE — Patient Instructions (Addendum)
You have been scheduled for an endoscopy. Please follow written instructions given to you at your visit today. CC:Dr Guttierez

## 2010-12-05 NOTE — Progress Notes (Signed)
[name] November 13, 1945 MRN [id number]    History of Present Illness:  This is a 65 year old white female with chronic gastroesophageal reflux and severe symptomatic diverticulosis. Her last colonoscopy in August 2005 was very difficult due to tortuosity and partial obstruction of the left colon. She is complaining of bloating, small, narrow caliber stools and alternating diarrhea and constipation. She has been on Prilosec 20 mg once a day for gastroesophageal reflux. She denies any dysphagia or odynophagia. Her upper abdominal ultrasound in 2006 was essentially normal. She has a mild abnormality of ALT of 48 with a normal AST of 35.   Past Medical History  Diagnosis Date  . Lump or mass in breast   . Carpal tunnel syndrome   . Migraine without aura, without mention of intractable migraine without mention of status migrainosus   . Diarrhea   . Diverticulosis of colon   . Fibromyalgia   . Genital herpes, unspecified   . Pure hypercholesterolemia   . Irritable bowel syndrome   . Laryngeal nodule     Dr. Thelma [name]  . Fibrocystic breast disease   . Osteoarthritis   . Genital herpes   . Hemorrhoids   . Anxiety    Past Surgical History  Procedure Date  . Appendectomy   . Mandible surgery   . Knee arthroscopy w/ orif     Left  . Osteotomy     Left foot  . 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 Total abdominal hysterectomy w/ bilateral salpingoophorectomy 1987    Endometriosis  . Dexa 03/28/99    osteopenia L/S  . Colonoscopy 1998    nml (sigmoid-Gilbert-nol)  . Head mri, head ct 10/1998    ? H/A focus ([name])  . Emg/mcv 10/16/01    + mild carpal tunnel  . Dexa 03/12/03    2.2 Spine 0.1 Hip  Osteopenia  . Mri right hip 12/12/01    Tendonitis Gluteus Medius to Gtr Trochanter Neg Bursitis (Dr. Montez [name])  . L/s films 11/21/01    nml; Right hip nml  . Mri u/s 11/21/01    min. DDD L/3-4  . Colonoscopy 01/04/04    polyps, diverticulosis/severe  . Dexa 11/14/05    1.9 Spine 0.3  Hip  slight improvement  . Flex laryngoscopy 06/11/06    ([name]) nml  . Dexa 11/22/06    improved osteo and fem neck  . Chevron bunionectomy 11/04/08    Right Great Toe (Dr. Lestine [name])  . Breast mass excision 01/2010    Left-fibrocystic change w/intraductal papilloma, no malignancy    reports that she has been passively smoking.  She has never used smokeless tobacco. She reports that she does not drink alcohol or use illicit drugs. family history includes Alcohol abuse in her paternal grandfather; Arthritis in her mother; Dementia in her mother; Emphysema in her mother; Heart disease in some unspecified family members; Hypertension in her maternal grandfather; Lung cancer in her father; Osteoporosis in her mother; and Stroke in her mother and paternal grandfather. Allergies  Allergen Reactions  . Antihistamines, Chlorpheniramine-Type   . Atorvastatin     REACTION: muscle cramps  . Influenza Vac Split (Flu Virus Vaccine) Other (See Comments)    Body aches  . Iodine   . Latex   . Milk-Related Compounds     REACTION: coughing, but does drink.  . Penicillins     REACTION: immune to it, able to take amoxicillin  . Promethazine Hcl     REACTION: Dizzy, headache  .  Rosuvastatin     REACTION: not effective  . Sulfasalazine         Review of Systems: Denies dysphagia odynophagia. Denies chest pain or shortness of breath.  The remainder of the 10  point ROS is negative except as outlined in H&P   Physical Exam: General appearance  Well developed, in no distress. Eyes- non icteric. HEENT nontraumatic, normocephalic. Mouth no lesions, tongue papillated, no cheilosis. Neck supple without adenopathy, thyroid not enlarged, no carotid bruits, no JVD. Lungs Clear to auscultation bilaterally. Cor normal S1 normal S2, regular rhythm , no murmur,  quiet precordium. Abdomen soft abdomen with normal active bowel sounds. Mild distention. Tenderness in left lower quadrant. No rebound or fullness.  Liver edge at costal margin. Rectal: Not done. Extremities no pedal edema. Skin no lesions. Neurological alert and oriented x 3. Psychological normal mood and affect.  Assessment and Plan:  Problem #1 gastroesophageal reflux may be causing indigestion and bloating. She is supposed to be on a twice a day dose of Prilosec but is taking only one a day. We will proceed with an upper endoscopy to assess for a hiatal hernia or Barrett's esophagus and determine if she needs to increase her Prilosec to twice a day dose.  Problem #2 Symptomatic diverticulosis. She has severe diverticular disease of the sigmoid colon with partial narrowing and obstruction. There is no history of diverticulitis. I advised her to use a high-fiber diet with fiber supplements to bulk up her stools.   12/05/2010 [name]

## 2010-12-07 ENCOUNTER — Encounter: Payer: Self-pay | Admitting: Family Medicine

## 2011-01-09 ENCOUNTER — Encounter: Payer: Self-pay | Admitting: Internal Medicine

## 2011-01-13 ENCOUNTER — Emergency Department: Payer: Self-pay | Admitting: Emergency Medicine

## 2011-01-20 ENCOUNTER — Ambulatory Visit (INDEPENDENT_AMBULATORY_CARE_PROVIDER_SITE_OTHER): Payer: Medicare Other | Admitting: Family Medicine

## 2011-01-20 ENCOUNTER — Encounter: Payer: Self-pay | Admitting: Family Medicine

## 2011-01-20 ENCOUNTER — Other Ambulatory Visit: Payer: Medicare Other | Admitting: Internal Medicine

## 2011-01-20 VITALS — BP 124/78 | HR 60 | Temp 97.6°F | Wt 166.8 lb

## 2011-01-20 DIAGNOSIS — R05 Cough: Secondary | ICD-10-CM

## 2011-01-20 DIAGNOSIS — J42 Unspecified chronic bronchitis: Secondary | ICD-10-CM | POA: Insufficient documentation

## 2011-01-20 DIAGNOSIS — N949 Unspecified condition associated with female genital organs and menstrual cycle: Secondary | ICD-10-CM

## 2011-01-20 DIAGNOSIS — R102 Pelvic and perineal pain: Secondary | ICD-10-CM

## 2011-01-20 DIAGNOSIS — R109 Unspecified abdominal pain: Secondary | ICD-10-CM

## 2011-01-20 DIAGNOSIS — M79672 Pain in left foot: Secondary | ICD-10-CM

## 2011-01-20 DIAGNOSIS — R059 Cough, unspecified: Secondary | ICD-10-CM

## 2011-01-20 DIAGNOSIS — M79609 Pain in unspecified limb: Secondary | ICD-10-CM

## 2011-01-20 LAB — POCT URINALYSIS DIPSTICK
Bilirubin, UA: NEGATIVE
Glucose, UA: NEGATIVE
Ketones, UA: NEGATIVE
Nitrite, UA: NEGATIVE
Protein, UA: NEGATIVE
Spec Grav, UA: 1.01
Urobilinogen, UA: 0.2
pH, UA: 6

## 2011-01-20 MED ORDER — GUAIFENESIN-CODEINE 100-10 MG/5ML PO SYRP: 5.0000 mL | ORAL_SOLUTION | Freq: Two times a day (BID) | ORAL | Status: DC | PRN

## 2011-01-20 MED ORDER — BENZONATATE 100 MG PO CAPS: 100.0000 mg | ORAL_CAPSULE | Freq: Three times a day (TID) | ORAL | Status: DC | PRN

## 2011-01-20 MED ORDER — ZOLPIDEM TARTRATE 5 MG PO TABS: 5.0000 mg | ORAL_TABLET | Freq: Every evening | ORAL | Status: DC | PRN

## 2011-01-20 MED ORDER — PREDNISONE 20 MG PO TABS: ORAL_TABLET | ORAL | Status: DC

## 2011-01-20 NOTE — Assessment & Plan Note (Signed)
Tenderness at 2nd MT shaft.  Concern for fracture.  No xray available today.  Will place pt in post op boot and return for xray to eval.

## 2011-01-20 NOTE — Assessment & Plan Note (Addendum)
Possible dx PNA on CXR and CT. Completed levaquin x7days.  Lungs clear today.  I don't think further bacterial infection, likely continued bronchitis. Will prolong steroid taper, provide tessalon and cheratussin for cough. To update [place] if worsening.

## 2011-01-20 NOTE — Assessment & Plan Note (Signed)
Diffuse abd pain, mild.  UA overall clear. Possible diverticulitis flare, more likely IBS flare. Advised ensure pushing fluids to stay well hydrated, slowly advance diet. Notify us if worsening.

## 2011-01-20 NOTE — Patient Instructions (Addendum)
No xrays today.  Please return next Monday morning for chest xray. I think you have continued bronchitis.  Lungs overall sounding clear today.  We will prolong steroid course and provide you with a taper over next week. Cheratussin for cough (with codeine). Push fluids and cranberry juice as you have been doing for now. Then slowly advance diet (nothing too heavy). Urine looking clear today. For foot - xray on Monday.  Until then wear post op shoe.  Try icing foot.

## 2011-01-20 NOTE — Progress Notes (Signed)
  Subjective:    Patient ID: [facility] [name], female    DOB: Oct 09, 1945, 65 y.o.   MRN: [id number]  HPI CC: hosp f/u, ?UTI  Ashley [name] presents today as hospital follow up after being seen at [facility] 01/13/2011 for SOB, tightness in chest and wheezing, dx with PNA and treated with levaquin and prednisone.  Records reviewed.  Blood culture - no growth, D dimer elevated at 1.39, WBC 11.5, K 3.3 and Cr 0.83.  LFTs normal.  CXR showing possible COPD and borderline cardiomegaly, could not r/o PNA.  CT chest showing small hiatal hernia, no evidence of PE, small bilat effusions with possible bronchiectasis in lower lobes, mild interstitial densities bilateral lungs  Sent home from ER.  Since then, on 7 days of levaquin (last day was today) as well as 7 days of prednisone 50mg .  Has been using ventolin pretty regularly.  No cough initially.  Cough started over last several days.  Deep cough, bringing up green sputum.  Felt feverish this am but no fever documented.  + abd pain worse LLQ.  + more diarrhea.  H/o diverticulitis.  Does have some vaginal pain and R flank pain so worried about UTI.  No blood in stool recently, thinks hemorrhoids acting up, no dysuria, urgency, frequency.  10d ago had OBGYN exam, told UA negative.  Has mammogram and bone density coming up this week.  Had EGD scheduled for today, but cancelled until feeling better.  Also...ball of left foot hurting for months, recently worsening.  Last week unable to walk up stairs.  Denies injury or trauma to foot.  No h/o osteoporosis.  Review of Systems Per HPI    Objective:   Physical Exam  Nursing note and vitals reviewed. Constitutional: She appears well-developed and well-nourished. No distress.       Cough at times  HENT:  Head: Normocephalic and atraumatic.  Right Ear: Hearing, tympanic membrane, external ear and ear canal normal.  Left Ear: Hearing, tympanic membrane, external ear and ear canal normal.  Nose: Nose normal. No  mucosal edema or rhinorrhea. Right sinus exhibits no maxillary sinus tenderness and no frontal sinus tenderness. Left sinus exhibits no maxillary sinus tenderness and no frontal sinus tenderness.  Mouth/Throat: Uvula is midline, oropharynx is clear and moist and mucous membranes are normal. No oropharyngeal exudate, posterior oropharyngeal edema, posterior oropharyngeal erythema or tonsillar abscesses.  Eyes: Conjunctivae and EOM are normal. Pupils are equal, round, and reactive to light. No scleral icterus.  Neck: Normal range of motion. Neck supple.  Cardiovascular: Normal rate, regular rhythm, normal heart sounds and intact distal pulses.   No murmur heard. Pulmonary/Chest: Effort normal and breath sounds normal. No respiratory distress. She has no wheezes. She has no rales.  Abdominal: Soft. Bowel sounds are normal. She exhibits no distension and no mass. There is no hepatosplenomegaly. There is generalized tenderness (mild). There is CVA tenderness (mild R). There is no rebound and no guarding.  Musculoskeletal:       Right foot: Normal.       Feet:       Left 4th toe lacking phalange  Lymphadenopathy:    She has no cervical adenopathy.  Skin: Skin is warm and dry. No rash noted.  Psychiatric: She has a normal mood and affect.      Assessment & Plan:

## 2011-01-23 ENCOUNTER — Other Ambulatory Visit: Payer: Self-pay | Admitting: Family Medicine

## 2011-01-23 ENCOUNTER — Ambulatory Visit (INDEPENDENT_AMBULATORY_CARE_PROVIDER_SITE_OTHER)
Admission: RE | Admit: 2011-01-23 | Discharge: 2011-01-23 | Disposition: A | Discharge status: Home / Self Care | Payer: Medicare Other | Source: Ambulatory Visit | Attending: Family Medicine | Admitting: Family Medicine

## 2011-01-23 DIAGNOSIS — M79672 Pain in left foot: Secondary | ICD-10-CM

## 2011-01-23 DIAGNOSIS — E7800 Pure hypercholesterolemia, unspecified: Secondary | ICD-10-CM

## 2011-01-23 DIAGNOSIS — M79609 Pain in unspecified limb: Secondary | ICD-10-CM

## 2011-01-23 DIAGNOSIS — R059 Cough, unspecified: Secondary | ICD-10-CM

## 2011-01-23 DIAGNOSIS — R05 Cough: Secondary | ICD-10-CM

## 2011-01-24 ENCOUNTER — Other Ambulatory Visit (INDEPENDENT_AMBULATORY_CARE_PROVIDER_SITE_OTHER): Payer: Medicare Other

## 2011-01-24 DIAGNOSIS — M79609 Pain in unspecified limb: Secondary | ICD-10-CM

## 2011-01-24 DIAGNOSIS — E7800 Pure hypercholesterolemia, unspecified: Secondary | ICD-10-CM

## 2011-01-24 DIAGNOSIS — M79672 Pain in left foot: Secondary | ICD-10-CM

## 2011-01-25 ENCOUNTER — Telehealth: Payer: Self-pay | Admitting: *Deleted

## 2011-01-25 LAB — LIPID PANEL
Cholesterol: 193 mg/dL (ref 0–200)
HDL: 73.5 mg/dL (ref >39.00)
LDL Cholesterol: 82 mg/dL (ref 0–99)
Total CHOL/HDL Ratio: 3
Triglycerides: 187 mg/dL — ABNORMAL HIGH (ref 0.0–149.0)
VLDL: 37.4 mg/dL (ref 0.0–40.0)

## 2011-01-25 LAB — HEPATIC FUNCTION PANEL
ALT: 22 U/L (ref 0–35)
AST: 18 U/L (ref 0–37)
Albumin: 4.3 g/dL (ref 3.5–5.2)
Alkaline Phosphatase: 77 U/L (ref 39–117)
Bilirubin, Direct: 0.1 mg/dL (ref 0.0–0.3)
Total Bilirubin: 0.4 mg/dL (ref 0.3–1.2)
Total Protein: 7.4 g/dL (ref 6.0–8.3)

## 2011-01-25 LAB — URIC ACID: Uric Acid, Serum: 4.5 mg/dL (ref 2.4–7.0)

## 2011-01-25 NOTE — Telephone Encounter (Signed)
Patient is asking for lab results.  She says she wants her cholesterol readings to see if she can discontinue Lipitor and just use fish oil or something of the like.  She is also anxious about blood work to determine if she has gout.

## 2011-01-25 NOTE — Telephone Encounter (Signed)
Results just came in this morning. Dr. Reece Agar has not reviewed them yet. I will call patient with results as soon as I receive them from him.

## 2011-02-17 ENCOUNTER — Inpatient Hospital Stay: Payer: Self-pay | Admitting: Internal Medicine

## 2011-02-20 ENCOUNTER — Telehealth: Payer: Self-pay | Admitting: *Deleted

## 2011-02-20 ENCOUNTER — Ambulatory Visit (INDEPENDENT_AMBULATORY_CARE_PROVIDER_SITE_OTHER): Payer: Medicare Other | Admitting: Pulmonary Disease

## 2011-02-20 ENCOUNTER — Encounter: Payer: Self-pay | Admitting: Pulmonary Disease

## 2011-02-20 ENCOUNTER — Telehealth: Payer: Self-pay | Admitting: Pulmonary Disease

## 2011-02-20 DIAGNOSIS — R05 Cough: Secondary | ICD-10-CM

## 2011-02-20 DIAGNOSIS — R059 Cough, unspecified: Secondary | ICD-10-CM

## 2011-02-20 DIAGNOSIS — R0602 Shortness of breath: Secondary | ICD-10-CM

## 2011-02-20 DIAGNOSIS — R062 Wheezing: Secondary | ICD-10-CM

## 2011-02-20 DIAGNOSIS — J189 Pneumonia, unspecified organism: Secondary | ICD-10-CM

## 2011-02-20 DIAGNOSIS — J309 Allergic rhinitis, unspecified: Secondary | ICD-10-CM

## 2011-02-20 NOTE — Progress Notes (Deleted)
  Subjective:    Patient ID: Ashley [name], female    DOB: 1945-05-27, 65 y.o.   MRN: [id number]  HPI    Review of Systems  Constitutional: Negative for fever, chills and unexpected weight change.  HENT: Negative for ear pain, nosebleeds, congestion, sore throat, rhinorrhea, sneezing, trouble swallowing, dental problem, voice change, postnasal drip and sinus pressure.   Eyes: Negative for visual disturbance.  Respiratory: Positive for cough, choking and shortness of breath.   Cardiovascular: Positive for chest pain. Negative for leg swelling.  Gastrointestinal: Positive for abdominal pain. Negative for vomiting and diarrhea.  Genitourinary: Negative for difficulty urinating.  Musculoskeletal: Negative for arthralgias.  Skin: Negative for rash.  Neurological: Negative for tremors, syncope and headaches.  Hematological: Bruises/bleeds easily.       Objective:   Physical Exam        Assessment & Plan:

## 2011-02-20 NOTE — Telephone Encounter (Signed)
Patient has been in [facility] since Thursday night with pneumonia. Patient states her oxygen level is 94% and has been down as low as 70%. Patient was told by the doctors there that she needs to see a pulmonologist, but they say that they can not refer her. Patient is requesting that you refer her and would like to see someone as soon as possible. Patient states that she is going to be discharged from the hospital today.

## 2011-02-20 NOTE — Progress Notes (Signed)
Subjective:    Patient ID: Ashley [name], female    DOB: 1945-12-12, 65 y.o.   MRN: [id number]  HPI 65 y/o female recently diagnosed with COPD presents to our clinic today for further evaluation of the same.  She states that she has been diagnosed with pneumonia twice recently and was hospitalized on 02/15/11 for shortness of breath and hypoxemic respiratory failure.  She was discharged today and has her discharge instructions with her but I do not have a discharge summary available.  Per her, she was given steroids, antibiotics, and nebulizers for hypoxemic respiratory failure.  On hospital day two she says that she developed respiratory distress and was told that she had fluid on her lungs.  She says that she was treated for this successfully, and it appears that she never required bipap or intubation.  She was diagnosed with COPD, she says based on a CT scan and was started on tiotropium and albuterol.  She was discharged home today on steroids and Levaquin.  She states that over the last seven or eight years she has had significant cough, wheezing, and mild dyspnea when exposed to pollen, dust, and perfumes.  She has been evaluated by an allergist for rhinitis but she says she has never been diagnosed with asthma.  She describes undergoing a metacholine challenge test 3 years ago at [facility], but the medical records department has no record of this.  She has used albuterol off an on over the years, but was recently put on the long acting bronchodilators.  She states that she is able to walk over 1/4 mile without stopping on level ground, but has difficulty climbing more than one flight of stairs due to dyspnea.    Review of Systems  Constitutional: Positive for chills and fatigue.  HENT: Positive for congestion and rhinorrhea. Negative for hearing loss and facial swelling.   Eyes: Negative.   Respiratory: Positive for cough, chest tightness, shortness of breath and wheezing.   Cardiovascular:  Positive for leg swelling. Negative for chest pain.  Genitourinary: Negative.  Negative for difficulty urinating.  Musculoskeletal: Positive for back pain and arthralgias.  Neurological: Positive for headaches.  Hematological: Negative.   Psychiatric/Behavioral: Negative.         Objective:   Physical Exam  Gen: well appearing, no acute distress HEENT: NCAT, PERRL, EOMi, OP clear, neck supple without masses PULM: CTA B GEN: RRR, no mgr, no JVD AB: BS+, soft, nontender, no hsm Ext: warm, no edema, no clubbing, no cyanosis Derm: no rash or skin breakdown Neuro: A&Ox4, CN II-XII intact, strength 5/5 in all 4 extremeties      Assessment & Plan:  Impression: 1) Recent pneumonia 2) Likely asthma 3) Recent fluid overload during hospitalization 4) Bronchiectasis  A&P : 65 y/o female presents for evaluation of intermittent coughing, wheezing, and dyspnea over the years.  Of note, she was hospitalized for the last five days for pneumonia and was discharged today on a methylprednisolone taper and Levaquin.  During that hospitalization she was diagnosed with COPD, but she has no chronic bronchitis symptoms to suggest this and there is no recent available spirometry to make this diagnosis.  The diagnosis of COPD is made based on clinical symptoms consistent with emphysema or chronic bronchitis with spirometry consistent with airflow obstruction.  Her chronic symptoms are more consistent with intermittent pulmonary symptoms likely from asthma given the clear environmental triggers she notes.  She states that she was told that she had normal spirometry in the  past which would fit with mild intermittent asthma.  There is no role for ordering PFTs today given that she is on a prednisone taper and was just discharged today for pneumonia vs. an obstructive airways disease exacerbation.  Fortunately, she appears to be doing well.  I have explained to her that I question the diagnosis of COPD, and therefore  the prescription for tiotropium, but I will not make any med changes today as she appears to be doing well.  1) Intermittent cough, wheezing, dyspnea: ddx Asthma vs. COPD vs. Bronchiectasis: symptoms favor asthma  -PFT's (spirometry on return visit)  -continue current regimen as prescribed at hospital discharge today (albuterol, tiotropium)  -request prior PFTs from [facility]  2) Pneumonia:  -continue Levaquin, robitussin AC  -request CT imaging (disk) and discharge summary from [facility]  -needs f/u CXR in 6 weeks when she returns  3) Allergic rhinitis:  -continue mometasone nasal  4) Return to clinic in 6 weeks

## 2011-02-20 NOTE — Telephone Encounter (Signed)
Can we get records from [facility]?  Thanks.  Have placed referral.

## 2011-02-20 NOTE — Telephone Encounter (Signed)
Message left notifying patient to expect call from [facility] in reference to referral. Records requested from [facility].

## 2011-02-20 NOTE — Patient Instructions (Signed)
Continue taking your medications as prescribed. Return to clinic in 6 weeks. You will have a CXR performed before clinic when you return. We will request the images of your recent CT scan and your metacholine challenge test from 3 years ago (at [facility]) to be sent to our office.

## 2011-02-21 ENCOUNTER — Ambulatory Visit (INDEPENDENT_AMBULATORY_CARE_PROVIDER_SITE_OTHER): Payer: Medicare Other | Admitting: Internal Medicine

## 2011-02-21 ENCOUNTER — Encounter: Payer: Self-pay | Admitting: Internal Medicine

## 2011-02-21 ENCOUNTER — Telehealth: Payer: Self-pay | Admitting: Pulmonary Disease

## 2011-02-21 VITALS — BP 124/78 | HR 126 | Temp 98.0°F

## 2011-02-21 DIAGNOSIS — R059 Cough, unspecified: Secondary | ICD-10-CM

## 2011-02-21 DIAGNOSIS — R05 Cough: Secondary | ICD-10-CM

## 2011-02-21 MED ORDER — FAMOTIDINE 20 MG PO TABS: ORAL_TABLET | ORAL | Status: DC

## 2011-02-21 MED ORDER — TRAMADOL HCL 50 MG PO TABS: ORAL_TABLET | ORAL | Status: DC

## 2011-02-21 NOTE — Patient Instructions (Addendum)
Taper off prednsione as prescribed  Take delsym two tsp every 12 hours and supplement if needed with  tramadol 50 mg up to 2 every 4 hours to suppress the urge to cough. Swallowing water or using ice chips/non mint and menthol containing candies (such as lifesavers or sugarless jolly ranchers) are also effective.  You should rest your voice and avoid activities that you know make you cough.  Once you have eliminated the cough for 3 straight days try reducing the tramadol first,  then the delsym as tolerated.    Prilosec 40 take  30 min before your first meal and pepcid 20 mg one at bedtime  For drainage take chlortrimeton 4 mg every 6 hours  For difficulty breathing use your nebulizer solution called levoalbuterol every 4 hours   GERD (REFLUX)  is an extremely common cause of respiratory symptoms, many times with no significant heartburn at all.    It can be treated with medication, but also with lifestyle changes including avoidance of late meals, excessive alcohol, smoking cessation, and avoid fatty foods, chocolate, peppermint, colas, red wine, and acidic juices such as orange juice.  NO MINT OR MENTHOL PRODUCTS SO NO COUGH DROPS  USE SUGARLESS CANDY INSTEAD (jolley ranchers or Stover's) or sips of water or ice NO OIL BASED VITAMINS - use powdered substitutes.  See Tammy NP w/in 2 weeks with all your medications, even over the counter meds, separated in two separate bags, the ones you take no matter what vs the ones you stop once you feel better and take only as needed when you feel you need them.   Tammy  will generate for you a new user friendly medication calendar that will put [place] all on the same page re: your medication use.     Without this process, it simply isn't possible to assure that we are providing  your outpatient care  with  the attention to detail we feel you deserve.   If we cannot assure that you're getting that kind of care,  then we cannot manage your problem effectively  from this clinic.  Once you have seen Tammy and we are sure that we're all on the same page with your medication use she will arrange follow up with me.

## 2011-02-21 NOTE — Progress Notes (Signed)
  Subjective:    Patient ID: Ashley [name], female    DOB: 1946/02/07, 65 y.o.   MRN: [id number]  HPI   65 yowf never smoker "coughed all her life"  Assoc with tendency to daily throat clearing  diagnosed with COPD presented to [facility] clinic for evaluation 02/20/11 with the hx that abx and prednisone seem to help the most. rec was to obtain records but no change in meds > returned to pulmonary clinic 10/9 with refractory cough 2 days p discharge from [facility] hospital for "copd exac"  She states that overall worse for the last seven or eight years she has had significant mostly dry cough, wheezing, and mild dyspnea when exposed to pollen, dust, and perfumes.  She has been evaluated by an allergist for rhinitis but she says she has never been diagnosed with asthma.  She describes undergoing a metacholine challenge test  08/05/09 which did not produce symptoms and was negative by standard criteria..  The last time she was only bothered by throat clearing and not requiring medications was March 2012 then got bronchitis "on the flight back"  From [place] and downhill since   Cough is worse during the day, mostly mucoid, minimally productive assoc with sorethroat and mild dysphagia but no overt sinus complaints  After taking tussionex Sleeping ok without nocturnal  or early am exacerbation  of respiratory  c/o's or need for noct saba. Also denies any obvious fluctuation of symptoms with weather or environmental changes or other aggravating or alleviating factors except as outlined above   Doesn't think any of her inhalers help.  ROS  At present neg for  any significant  itching, sneezing,  nasal congestion or excess/ purulent secretions,  fever, chills, sweats, unintended wt loss, pleuritic or exertional cp, hempoptysis, orthopnea pnd or leg swelling.  Also denies presyncope, palpitations, heartburn, abdominal pain, nausea, vomiting, diarrhea  or change in bowel or urinary habits, dysuria,hematuria,   rash, arthralgias, visual complaints, headache, numbness weakness or ataxia.            Objective:   Physical Exam  Gen: well appearing, no acute distress HEENT: NCAT, PERRL, EOMi, OP clear, neck supple without masses PULM: CTA B - no cough reproduced on inps or exp GEN: RRR, no mgr, no JVD AB: BS+, soft, nontender, no hsm Ext: warm, no edema, no clubbing, no cyanosis Derm: no rash or skin breakdown Neuro: A&Ox4, CN II-XII intact, strength 5/5 in all 4 extremeties  01/13/11 CT Chest neg pe, minimal basilar changes  01/20/11 cxr  Perihilar bronchitic changes.  2. Basilar subsegmental atelectasis.  3. No consolidations.     Assessment & Plan:

## 2011-02-21 NOTE — Telephone Encounter (Signed)
Appt made with Dr Kendrick Fries at [place] office on 02/20/2011 at 4pm. [facility]

## 2011-02-21 NOTE — Telephone Encounter (Signed)
Called and spoke with pt. She states wheezed all night, cough is worse and feels current meds are not helping. I advised we need to see her back asap for eval- OV with MW this afternoon at 1:30 pm, advised her to bring all meds and she verbalized understanding.

## 2011-02-21 NOTE — Telephone Encounter (Signed)
Pt calling again to speak to nurse can also be reached at [phone].Ashley [name]

## 2011-02-22 ENCOUNTER — Encounter: Payer: Self-pay | Admitting: Internal Medicine

## 2011-02-22 NOTE — Assessment & Plan Note (Signed)
Symptoms are markedly disproportionate to objective findings and not clear this is a lung problem but pt does appear to have difficult airway management issues.  DDX of  difficult airways managment all start with A and  include Adherence, Ace Inhibitors, Acid Reflux, Active Sinus Disease, Alpha 1 Antitripsin deficiency, Anxiety masquerading as Airways dz,  ABPA,  allergy(esp in young), Aspiration (esp in elderly), Adverse effects of DPI,  Active smokers, plus two Bs  = Bronchiectasis and Beta blocker use..and one C= CHF   Anxiety is a leading dx but one of exclusion.  In the meantime treating acid reflux aggressively and trying to eliminate cough are the key to controlling her problem  NB  Of the three most common causes of chronic cough, only one (GERD)  can actually cause the other two (asthma and post nasal drip syndrome)  and perpetuate the cylce of cough inducing airway trauma, inflammation, heightened sensitivity to reflux which is prompted by the cough itself via a cyclical mechanism.    This may partially respond to steroids and look like asthma and post nasal drainage but never erradicated completely unless the cough and the secondary reflux are eliminated, preferably both at the same time.  While not intuitively obvious, many patients with chronic low grade reflux do not cough until there is a secondary insult that disturbs the protective epithelial barrier and exposes sensitive nerve endings.  This can be viral or direct physical injury such as with an endotracheal tube.   The point is that once this occurs, it is difficult to eliminate using anything but a maximally effective acid suppression regimen at least in the short run, accompanied by an appropriate diet to address non acid GERD.   See instructions for specific recommendations which were reviewed directly with the patient who was given a copy with highlighter outlining the key components.

## 2011-02-23 ENCOUNTER — Telehealth: Payer: Self-pay | Admitting: Internal Medicine

## 2011-02-23 ENCOUNTER — Encounter: Payer: Self-pay | Admitting: Family Medicine

## 2011-02-23 NOTE — Telephone Encounter (Signed)
Spoke with pt. She was seen 02/20/11 in [facility] for initial eval- had recent PNA. She then saw MW the following day for worsening wheeze and cough. She states that she is still wheezing a lot at night, having insomnia and feels that every time she tried to fall asleep her arms and legs would jerk. I advised MW and BM out of the office today, so will need to forward msg to our doc on call. CDY, pls advise, thanks!

## 2011-02-23 NOTE — Telephone Encounter (Signed)
Spoke with pt and notified of recs per CDY. Pt verbalized understanding and states will try the tonic water and see if this helps. She agrees to stay on prednisone to help clear wheeze.

## 2011-02-23 NOTE — Telephone Encounter (Signed)
LMTCB

## 2011-02-23 NOTE — Telephone Encounter (Signed)
Per CY-if she can hang in, the prednisone should help the wheeze but may cause insomnia, clearing the wheeze is the most important thing.   The meds may be causing her to jerk some; if the limb jerking was a problem before, then we can treat it, but if it is just short term on these meds, try a glass of tonic water(quinine) flavored with orange juice, at supper for a few days.

## 2011-02-24 ENCOUNTER — Other Ambulatory Visit (INDEPENDENT_AMBULATORY_CARE_PROVIDER_SITE_OTHER): Payer: Medicare Other | Admitting: *Deleted

## 2011-02-24 ENCOUNTER — Telehealth: Payer: Self-pay | Admitting: Cardiology

## 2011-02-24 DIAGNOSIS — R0609 Other forms of dyspnea: Secondary | ICD-10-CM

## 2011-02-24 DIAGNOSIS — R0989 Other specified symptoms and signs involving the circulatory and respiratory systems: Secondary | ICD-10-CM

## 2011-02-24 DIAGNOSIS — I340 Nonrheumatic mitral (valve) insufficiency: Secondary | ICD-10-CM | POA: Insufficient documentation

## 2011-02-24 DIAGNOSIS — R062 Wheezing: Secondary | ICD-10-CM

## 2011-02-24 DIAGNOSIS — R0602 Shortness of breath: Secondary | ICD-10-CM

## 2011-02-24 DIAGNOSIS — I5042 Chronic combined systolic (congestive) and diastolic (congestive) heart failure: Secondary | ICD-10-CM | POA: Insufficient documentation

## 2011-02-24 DIAGNOSIS — I5020 Unspecified systolic (congestive) heart failure: Secondary | ICD-10-CM

## 2011-02-24 DIAGNOSIS — I5022 Chronic systolic (congestive) heart failure: Secondary | ICD-10-CM | POA: Insufficient documentation

## 2011-02-24 NOTE — Telephone Encounter (Signed)
Placed referral in chart and sent to [facility].

## 2011-02-24 NOTE — Telephone Encounter (Signed)
Dr. Sallyanne Kuster

## 2011-02-24 NOTE — Telephone Encounter (Signed)
Addended by: [name] on: 02/24/2011 04:57 PM   Modules accepted: Orders

## 2011-02-24 NOTE — Telephone Encounter (Signed)
Patient has very abnormal 2D Echo, ordered at discharge from [facility] due to sinus tach on EKG.  EF is 25-35%, moderate to severe MR.  Reviewed with Dr. Mariah Milling, who thinks she should have a cardiac consult ASAP.  He is happy to try to work her in next week.

## 2011-02-27 ENCOUNTER — Encounter (HOSPITAL_COMMUNITY): Payer: Self-pay | Admitting: Pulmonary Disease

## 2011-02-27 ENCOUNTER — Encounter: Payer: Self-pay | Admitting: Pulmonary Disease

## 2011-02-28 NOTE — Telephone Encounter (Signed)
Ashley [name], were you expecting something from pt?  If so, did you receive this?

## 2011-03-01 ENCOUNTER — Ambulatory Visit (INDEPENDENT_AMBULATORY_CARE_PROVIDER_SITE_OTHER): Payer: Medicare Other | Admitting: Cardiovascular Disease

## 2011-03-01 ENCOUNTER — Encounter: Payer: Self-pay | Admitting: Cardiovascular Disease

## 2011-03-01 ENCOUNTER — Encounter: Payer: Self-pay | Admitting: *Deleted

## 2011-03-01 DIAGNOSIS — I059 Rheumatic mitral valve disease, unspecified: Secondary | ICD-10-CM

## 2011-03-01 DIAGNOSIS — R0602 Shortness of breath: Secondary | ICD-10-CM

## 2011-03-01 DIAGNOSIS — I420 Dilated cardiomyopathy: Secondary | ICD-10-CM | POA: Insufficient documentation

## 2011-03-01 DIAGNOSIS — I5020 Unspecified systolic (congestive) heart failure: Secondary | ICD-10-CM

## 2011-03-01 DIAGNOSIS — E7800 Pure hypercholesterolemia, unspecified: Secondary | ICD-10-CM

## 2011-03-01 DIAGNOSIS — R079 Chest pain, unspecified: Secondary | ICD-10-CM

## 2011-03-01 DIAGNOSIS — I340 Nonrheumatic mitral (valve) insufficiency: Secondary | ICD-10-CM

## 2011-03-01 DIAGNOSIS — I428 Other cardiomyopathies: Secondary | ICD-10-CM

## 2011-03-01 MED ORDER — FUROSEMIDE 20 MG PO TABS: 20.0000 mg | ORAL_TABLET | Freq: Two times a day (BID) | ORAL | Status: DC

## 2011-03-01 NOTE — Assessment & Plan Note (Signed)
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 There is no significant family history of coronary artery disease. We will schedule her for a pharmacologic Myoview at [facility] next week to exclude ischemia. If this is normal with no suggestion of underlying coronary artery disease, it is impossible this could be secondary to a viral etiology or idiopathic.  Management will include low-dose beta blocker if tolerated, ACE inhibitor, diuretic to start.

## 2011-03-01 NOTE — Progress Notes (Signed)
Patient ID: Ashley [name], female    DOB: June 22, 1945, 65 y.o.   MRN: [id number]  HPI Comments: 65 y/o female With history of pneumonia, chronic cough, diagnosis of COPD made recently during a hospital course at [facility], recent shortness of breath since September of this year that presented acutely on September 1, CT scan showing progressive pleural effusion with a repeat scan 10 days ago, Who presents for cardiac evaluation after recent echocardiogram showing Cardiomyopathy and depressed ejection fraction estimated at 25-35%.  She reports that she has not felt well since the beginning of September. She did start to feel better after antibiotics, prednisone. Her symptoms seemed to relapse 2 weeks ago with shortness of breath and wheezing. She feels a fullness in her abdomen and if she bends over, her "air is cut off". She does feel she has abdominal fullness. No lower extremity edema. She uses several pillows to sleep on a chronic basis though has had more difficulty with sleeping recently. She has had several episodes where she has to sit up, even stand up and walk around to make her breathing better.  She has had symptoms of fluttering in her chest, sometimes they are mild, other times on a more regular basis. This has been going for several months.  She has had recent weight loss of 6 pounds over the past 2 weeks after she was treated with Lasix for 4 days in the hospital. Her cough has improved slightly with some improvement of her shortness of breath.  EKG shows normal sinus rhythm with rate 95 beats per minute with frequent PVCs, no significant ST or T wave changes       Outpatient Encounter Prescriptions as of 03/01/2011  Medication Sig Dispense Refill  . albuterol (PROVENTIL HFA;VENTOLIN HFA) 108 (90 BASE) MCG/ACT inhaler Inhale 2 puffs into the lungs every 6 (six) hours as needed.        . ALPRAZolam (XANAX) 0.25 MG tablet Take 0.25 mg by mouth 3 (three) times daily as needed.          Marland Kitchen aspirin 81 MG EC tablet Take 81 mg by mouth daily.        Marland Kitchen atorvastatin (LIPITOR) 10 MG tablet Take 1 tablet (10 mg total) by mouth daily. Brand medically necessary.  30 tablet  11  . Cholecalciferol (VITAMIN D3) 400 UNITS CAPS Take 1 capsule by mouth daily.        . famotidine (PEPCID) 20 MG tablet One at bedtime      . levalbuterol (XOPENEX) 1.25 MG/3ML nebulizer solution Take 1 ampule by nebulization every 6 (six) hours as needed.        . loteprednol (LOTEMAX) 0.5 % ophthalmic suspension Place 1 drop into both eyes as directed.        . methylPREDNISolone (MEDROL DOSEPAK) 4 MG tablet follow package directions       . mometasone (NASONEX) 50 MCG/ACT nasal spray 1 spray by Nasal route 2 (two) times daily as needed.        . naproxen sodium (ANAPROX) 220 MG tablet Take 220 mg by mouth 2 (two) times daily as needed.        Marland Kitchen omeprazole (PRILOSEC) 40 MG capsule Take 40 mg by mouth daily.       Bertram Gala Glycol-Propyl Glycol (SYSTANE ULTRA OP) Apply to eye as directed.        . sodium chloride (OCEAN) 0.65 % nasal spray Place 1 spray into the nose as needed.        Marland Kitchen  traMADol (ULTRAM) 50 MG tablet 1-2 every 4 hours as needed for cough or pain  40 tablet  0  . vitamin C (ASCORBIC ACID) 500 MG tablet Take 500 mg by mouth daily.        Marland Kitchen zolpidem (AMBIEN) 5 MG tablet Take 1 tablet (5 mg total) by mouth at bedtime as needed. For insomnia   30 tablet  0    Review of Systems  Constitutional: Positive for fatigue.  HENT: Negative.   Eyes: Negative.   Respiratory: Positive for cough, shortness of breath and wheezing.   Cardiovascular: Positive for palpitations.  Gastrointestinal: Negative.   Musculoskeletal: Negative.   Skin: Negative.   Neurological: Negative.   Hematological: Negative.   Psychiatric/Behavioral: Negative.   All other systems reviewed and are negative.    BP 110/64  Pulse 95  Ht 5\' 5"  (1.651 m)  Wt 160 lb (72.576 kg)  BMI 26.63 kg/m2   Physical Exam  Nursing  note and vitals reviewed. Constitutional: She is oriented to person, place, and time. She appears well-developed and well-nourished.  HENT:  Head: Normocephalic.  Nose: Nose normal.  Mouth/Throat: Oropharynx is clear and moist.  Eyes: Conjunctivae are normal. Pupils are equal, round, and reactive to light.  Neck: Normal range of motion. Neck supple. No JVD present.  Cardiovascular: Normal rate, regular rhythm, S1 normal, S2 normal, normal heart sounds and intact distal pulses.  Exam reveals no gallop and no friction rub.   No murmur heard. Pulmonary/Chest: Effort normal. No respiratory distress. She has decreased breath sounds. She has no wheezes. She has no rales. She exhibits no tenderness.  Abdominal: Soft. Bowel sounds are normal. She exhibits no distension. There is no tenderness.  Musculoskeletal: Normal range of motion. She exhibits no edema and no tenderness.  Lymphadenopathy:    She has no cervical adenopathy.  Neurological: She is alert and oriented to person, place, and time. Coordination normal.  Skin: Skin is warm and dry. No rash noted. No erythema.  Psychiatric: She has a normal mood and affect. Her behavior is normal. Judgment and thought content normal.         Assessment and Plan

## 2011-03-01 NOTE — Assessment & Plan Note (Signed)
Previous echocardiogram showing significant mitral valve regurgitation. This is likely secondary to LV dilatation and will hopefully improve with gentle diuresis. If this continues to be significant on followup echocardiography, we may have to closely evaluate the mitral valve with a TEE.

## 2011-03-01 NOTE — Assessment & Plan Note (Signed)
I suspect she has had a component of systolic CHF over the past month or so. This would explain her pleural effusions. She has had recent 6 pound weight loss, though does continue to have shortness of breath with exertion. She reports that she is very sensitive to medications. One of her biggest complaint is her palpitations and fast heart rate. I suspect this is a combination of depressed LV function and possible underlying lung issues. In an effort to manage her cardiomyopathy, we will try a low dose of bystolic, avoiding other beta blockers to minimize any bronchoconstriction.   We will start Lasix 20 mg daily. Asked her to monitor her weight, decrease her salt intake. If her weight dropped more than 3-4 pounds, we will likely check a basic metabolic panel to make sure that she is not hypokalemic. We were unable to obtain an accurate estimation of a right ventricular systolic pressures on the echocardiogram.  In the next few weeks, we would like to slowly titrate the bystolic though first with at low dose ACE inhibitor.

## 2011-03-01 NOTE — Assessment & Plan Note (Signed)
We have suggested she stay on Lipitor.

## 2011-03-01 NOTE — Patient Instructions (Addendum)
Please take lasix 20 mg daily Monitor your weight Start bystolic 5 mg once a day (to slow heart rate) Monitor your blood pressure and heart rate   Please call us if you have new issues that need to be addressed before your next appt.   Your physician has requested that you have a lexiscan myoview. For further information please visit [email or web address] Please follow instruction sheet, as given.  Your physician recommends that you schedule a follow-up appointment in: 2 weeks

## 2011-03-03 ENCOUNTER — Ambulatory Visit: Payer: Medicare Other | Admitting: Cardiovascular Disease

## 2011-03-08 ENCOUNTER — Ambulatory Visit: Payer: Self-pay | Admitting: Cardiovascular Disease

## 2011-03-08 ENCOUNTER — Encounter: Payer: Medicare Other | Admitting: Adult Health

## 2011-03-08 DIAGNOSIS — R0602 Shortness of breath: Secondary | ICD-10-CM

## 2011-03-08 DIAGNOSIS — R079 Chest pain, unspecified: Secondary | ICD-10-CM

## 2011-03-10 ENCOUNTER — Ambulatory Visit (INDEPENDENT_AMBULATORY_CARE_PROVIDER_SITE_OTHER): Payer: Medicare Other | Admitting: Adult Health

## 2011-03-10 ENCOUNTER — Telehealth: Payer: Self-pay | Admitting: *Deleted

## 2011-03-10 ENCOUNTER — Encounter: Payer: Self-pay | Admitting: Adult Health

## 2011-03-10 DIAGNOSIS — R059 Cough, unspecified: Secondary | ICD-10-CM

## 2011-03-10 DIAGNOSIS — R05 Cough: Secondary | ICD-10-CM

## 2011-03-10 NOTE — Patient Instructions (Signed)
Follow med calendar closely and bring to each visit.  Begin Pepcid 20mg  At bedtime   follow up Dr Kendrick Fries in 1 month as planned and As needed

## 2011-03-10 NOTE — Assessment & Plan Note (Signed)
Chronic cough suspect is multifactoral in nature with w/up in past neg for MCT, nml CT sinus and CT chest neg PE / bronchiectasis.   Would avoid ACE inhibitor in future due to cough factor.  For now continue w/ GERD and cough protocol for now Cough and dyspnea may be aggravated by recent dx of cardiomyopathy- cont under Dr. Windell Hummingbird care for this  Patient's medications were reviewed today and patient education was given. Computerized medication calendar was adjusted/completed

## 2011-03-10 NOTE — Telephone Encounter (Signed)
Called pt and left msg to call back regarding her lexiscan results. Per TG results look ok, there was some GI uptake, possible virus? She can f/u 6 mo. Will discuss with pt upon return call.

## 2011-03-10 NOTE — Progress Notes (Signed)
Addended by: Tommie Sams on: 03/10/2011 12:14 PM   Modules accepted: Orders

## 2011-03-10 NOTE — Telephone Encounter (Signed)
Pt notified of results. She wants to know if still shows leaky valve and low EF as seen on echo? She still takes lasix BID, not urinating as much as before, still using breathing machine. She does have f/u with TG next wed. Told pt TG would discuss test further in detail at this appt, otherwise to call sooner with any problems.

## 2011-03-10 NOTE — Progress Notes (Signed)
Subjective:    Patient ID: Ashley [name], female    DOB: 08-17-1945, 65 y.o.   MRN: [id number]  HPI 26  yowf never smoker "coughed all her life"  Assoc with tendency to daily throat clearing  diagnosed with COPD presented to [facility] clinic for evaluation 02/20/11  02/20/11 Initial Consult -[facility]  She states that overall worse for the last seven or eight years she has had significant mostly dry cough, wheezing, and mild dyspnea when exposed to pollen, dust, and perfumes.  She has been evaluated by an allergist for rhinitis but she says she has never been diagnosed with asthma.  She describes undergoing a metacholine challenge test  08/05/09 which did not produce symptoms and was negative by standard criteria..  The last time she was only bothered by throat clearing and not requiring medications was March 2012 then got bronchitis "on the flight back"  From [place] and downhill since   Cough is worse during the day, mostly mucoid, minimally productive assoc with sorethroat and mild dysphagia but no overt sinus complaints  After taking tussionex Sleeping ok without nocturnal  or early am exacerbation  of respiratory  c/o's or need for noct saba. Also denies any obvious fluctuation of symptoms with weather or environmental changes or other aggravating or alleviating factors except as outlined above   Doesn't think any of her inhalers help. >>REC: Cough control , taper off off prednsione , delsym /tramadol for cough, pepcid qhs, chlor tabs every 6hr .    03/10/2011 Follow up and Med Review Pt returns for follow up and med review.  We reviewed all her meds and organized them into a med calendar.  She has a full large bags of meds, old /new , empty bottles and bottles of meds poured into nonmarked containers. Advised of dangers of unlabeled med containers. " I still have good mind and know what I am taking"   Since last ov she has underwent 2 D echo that showed  Cardiomyopathy and depressed  ejection fraction estimated at 25-35%, MV regurg. She underwent stress myoview yesterday , results pending. She has been seen by Dr. Mariah Milling and started on bystolic for cardiomyopathy. Started on lasix 40mg  daily    Seen 2 weeks ago for cough, recommended to start cough protocol with tramadol and delsym , gerd prevention with pepcid and prilosec and chlor tabs for drainage.  She is using delsym and tramadol but cough is unchanged . Did not start pepcid. Unable to tolerate chlor tabs for drainage.       Constitutional:   No  weight loss, night sweats,  Fevers, chills,  +fatigue, or  lassitude.  HEENT:   No headaches,  Difficulty swallowing,  Tooth/dental problems, or  Sore throat,                No sneezing, itching, ear ache,  +nasal congestion, post nasal drip,   CV:  No chest pain,  Orthopnea, PND, swelling in lower extremities, anasarca, dizziness, palpitations, syncope.   GI  No heartburn, indigestion, abdominal pain, nausea, vomiting, diarrhea, change in bowel habits, loss of appetite, bloody stools.   Resp:  No coughing up of blood.  No change in color of mucus.   No chest wall deformity  Skin: no rash or lesions.    MS:  No joint pain or swelling.  No decreased range of motion.    Psych:  No change in mood or affect. No depression or anxiety.  No memory loss.  Objective:   Physical Exam GEN: A/Ox3; pleasant , NAD, elderly   HEENT:  [place]/AT,  EACs-clear, TMs-wnl, NOSE-clear, THROAT-clear, no lesions, no postnasal drip or exudate noted.   NECK:  Supple w/ fair ROM; no JVD; normal carotid impulses w/o bruits; no thyromegaly or nodules palpated; no lymphadenopathy.  RESP  Clear  P & A; w/o, wheezes/ rales/ or rhonchi.no accessory muscle use, no dullness to percussion  CARD:  RRR, faint 1/6 SM  , tr  peripheral edema, pulses intact, no cyanosis or clubbing.  GI:   Soft & nt; nml bowel sounds; no organomegaly or masses detected.  Musco: Warm bil, no deformities  or joint swelling noted.   Neuro: alert, no focal deficits noted.    Skin: Warm, no lesions or rashes     Assessment & Plan:  Cough Chronic cough suspect is multifactoral in nature with w/up in past neg for MCT, nml CT sinus and CT chest neg PE / bronchiectasis.   Would avoid ACE inhibitor in future due to cough factor.  For now continue w/ GERD and cough protocol for now Cough and dyspnea may be aggravated by recent dx of cardiomyopathy- cont under Dr. Windell Hummingbird care for this  Patient's medications were reviewed today and patient education was given. Computerized medication calendar was adjusted/completed

## 2011-03-12 NOTE — Telephone Encounter (Signed)
myoview does not look at valves. EF looks improved

## 2011-03-13 NOTE — Telephone Encounter (Signed)
Attempted to contact pt, LMOM TCB.  

## 2011-03-14 NOTE — Telephone Encounter (Signed)
Attempted to contact pt, LMOM TCB.  

## 2011-03-15 ENCOUNTER — Encounter: Payer: Self-pay | Admitting: Cardiovascular Disease

## 2011-03-15 ENCOUNTER — Ambulatory Visit (INDEPENDENT_AMBULATORY_CARE_PROVIDER_SITE_OTHER): Payer: Medicare Other | Admitting: Cardiovascular Disease

## 2011-03-15 DIAGNOSIS — I059 Rheumatic mitral valve disease, unspecified: Secondary | ICD-10-CM

## 2011-03-15 DIAGNOSIS — R059 Cough, unspecified: Secondary | ICD-10-CM

## 2011-03-15 DIAGNOSIS — R55 Syncope and collapse: Secondary | ICD-10-CM

## 2011-03-15 DIAGNOSIS — I340 Nonrheumatic mitral (valve) insufficiency: Secondary | ICD-10-CM

## 2011-03-15 DIAGNOSIS — F410 Panic disorder [episodic paroxysmal anxiety] without agoraphobia: Secondary | ICD-10-CM

## 2011-03-15 DIAGNOSIS — R05 Cough: Secondary | ICD-10-CM

## 2011-03-15 DIAGNOSIS — R0602 Shortness of breath: Secondary | ICD-10-CM

## 2011-03-15 DIAGNOSIS — I428 Other cardiomyopathies: Secondary | ICD-10-CM

## 2011-03-15 NOTE — Progress Notes (Signed)
Patient ID: Ashley [name], female    DOB: 1945-09-05, 65 y.o.   MRN: [id number]  HPI Comments: 65 y/o female With history of pneumonia, chronic cough, diagnosis of COPD made recently during a hospital course at [facility], recent shortness of breath since September 2012 that presented acutely on September 1, CT scan showing progressive pleural effusion on a repeat scan ,  Cardiomyopathy  ejection fraction estimated at 25-35%, Presenting for routine followup.  She recently completed a Lexiscan Myoview in the hospital. She reported that it took over today to recover as the medication made her insides feel weird. Stress test showed no significant ischemia, no EKG changes concerning for ischemia.   She reports her biggest complaint is feeling palpitations and tachycardia. This comes on at rest as well as with exertion. She has numerous other issues that she would like to discuss including a pain or odd feeling in the left side of her neck Like "the area is getting sucked out" She also has a dimpling on her left flank in between her ribs that she reports was not there before. She has numerous other issues that appear to be noncardiac that are troubling to her.  She has been taking low dose benzodiazepine for anxiety at least twice a day which she reports has been helping.  She feels that her skin is getting dry and is changing color, getting wrinkled.  Bystolic 5 mg was started previously for blood pressure and heart rate control as well as to start a beta blocker in the setting of cardiomyopathy with underlying COPD.  EKG shows normal sinus rhythm with rate 94 beats per minute with frequent PVCs, no significant ST or T wave changes      Outpatient Encounter Prescriptions as of 03/15/2011  Medication Sig Dispense Refill  . albuterol (PROVENTIL HFA;VENTOLIN HFA) 108 (90 BASE) MCG/ACT inhaler Inhale 2 puffs into the lungs every 4 (four) hours as needed.       . ALPRAZolam (XANAX) 0.25 MG tablet 0.25  mg. 8 hrs as needed      . aspirin 81 MG EC tablet Take 81 mg by mouth at bedtime.       Marland Kitchen atorvastatin (LIPITOR) 10 MG tablet Take 1 tablet (10 mg total) by mouth daily. Brand medically necessary.  30 tablet  11  . Cholecalciferol (VITAMIN D3) 400 UNITS CAPS Take 3 capsules by mouth daily.       Marland Kitchen dextromethorphan (DELSYM) 30 MG/5ML liquid 2 tsp every 12 hours as needed       . famotidine (PEPCID) 20 MG tablet One at bedtime      . furosemide (LASIX) 20 MG tablet Take two tablets daily before noon.       Marland Kitchen HYDROcodone-acetaminophen (VICODIN ES) 7.5-750 MG per tablet Take 1 tablet by mouth every 6 (six) hours as needed.        . levalbuterol (XOPENEX) 1.25 MG/3ML nebulizer solution Take 1 ampule by nebulization every 4 (four) hours as needed.       . mometasone (NASONEX) 50 MCG/ACT nasal spray Place 1 spray into the nose 2 (two) times daily.       . naproxen sodium (ANAPROX) 220 MG tablet Per bottle      . nebivolol (BYSTOLIC) 5 MG tablet Take 1 tablet (5 mg total) by mouth daily.  30 tablet    . omeprazole (PRILOSEC) 40 MG capsule Take 40 mg by mouth 2 (two) times daily.        . traMADol (  ULTRAM) 50 MG tablet 1-2 tablets every 4 hrs as needed       . vitamin C (ASCORBIC ACID) 500 MG tablet Take 500 mg by mouth daily.        Marland Kitchen zolpidem (AMBIEN) 5 MG tablet 1/2 to 1 tablet at bedtime as needed For insomnia          Review of Systems  Constitutional: Positive for fatigue.  HENT: Negative.   Eyes: Negative.   Respiratory: Positive for cough.   Cardiovascular: Positive for palpitations.  Gastrointestinal: Negative.   Musculoskeletal: Negative.   Skin: Negative.   Neurological: Negative.   Hematological: Negative.   Psychiatric/Behavioral: Negative.   All other systems reviewed and are negative.    BP 110/62  Pulse 94  Ht 5\' 5"  (1.651 m)  Wt 161 lb (73.029 kg)  BMI 26.79 kg/m2   Physical Exam  Nursing note and vitals reviewed. Constitutional: She is oriented to person, place,  and time. She appears well-developed and well-nourished.  HENT:  Head: Normocephalic.  Nose: Nose normal.  Mouth/Throat: Oropharynx is clear and moist.  Eyes: Conjunctivae are normal. Pupils are equal, round, and reactive to light.  Neck: Normal range of motion. Neck supple. No JVD present.  Cardiovascular: Normal rate, regular rhythm, S1 normal, S2 normal, normal heart sounds and intact distal pulses.  Exam reveals no gallop and no friction rub.   No murmur heard. Pulmonary/Chest: Effort normal. No respiratory distress. She has decreased breath sounds. She has no wheezes. She has no rales. She exhibits no tenderness.  Abdominal: Soft. Bowel sounds are normal. She exhibits no distension. There is no tenderness.  Musculoskeletal: Normal range of motion. She exhibits no edema and no tenderness.  Lymphadenopathy:    She has no cervical adenopathy.  Neurological: She is alert and oriented to person, place, and time. Coordination normal.  Skin: Skin is warm and dry. No rash noted. No erythema.  Psychiatric: She has a normal mood and affect. Her behavior is normal. Judgment and thought content normal.         Assessment and Plan

## 2011-03-15 NOTE — Assessment & Plan Note (Signed)
Uncertain if her cough is secondary to recent finding of cardiomyopathy. Ideally, in the future we would like to add low dose ACE inhibitor given her depressed ejection fraction. We will add this at a later date after advancing her bystolic

## 2011-03-15 NOTE — Assessment & Plan Note (Signed)
She has numerous complaints on today's visit, some of which could have to do with anxiety or issues unrelated to the cardiovascular system. We have suggested she continue on her benzodiazepines as needed.

## 2011-03-15 NOTE — Patient Instructions (Addendum)
You are doing well. No medication changes were made. Please increase the bystolic to 5 mg twice a day We would like to add lisinopril on the next visit  Please call us if you have new issues that need to be addressed before your next appt.  The office will contact you for a follow up Appt. In 3 months

## 2011-03-15 NOTE — Assessment & Plan Note (Addendum)
Moderate to severe MR on recent echocardiogram. This may improve Would continue diuresis, and medical management. Will plan on repeat echocardiogram in several months time.

## 2011-03-15 NOTE — Assessment & Plan Note (Signed)
In an effort to try to advance her medical management for cardiomyopathy, presumed to be nonischemic, possibly viral from her recent upper respiratory infection over the past several months, we will increase her bystolic 5 mg b.i.d.. If she is able to tolerate this, we will add an ACE inhibitor at a later date. We have suggested she continue with her Lasix and we will check a basic metabolic panel today.

## 2011-03-16 ENCOUNTER — Encounter: Payer: Self-pay | Admitting: Family Medicine

## 2011-03-16 ENCOUNTER — Encounter: Payer: Self-pay | Admitting: Cardiovascular Disease

## 2011-03-16 LAB — BASIC METABOLIC PANEL
BUN/Creatinine Ratio: 16 (ref 11–26)
BUN: 18 mg/dL (ref 8–27)
CO2: 24 mmol/L (ref 20–32)
Calcium: 9.6 mg/dL (ref 8.6–10.2)
Chloride: 101 mmol/L (ref 97–108)
Creatinine, Ser: 1.11 mg/dL — ABNORMAL HIGH (ref 0.57–1.00)
GFR calc Af Amer: 60 mL/min/{1.73_m2} (ref >59)
GFR calc non Af Amer: 52 mL/min/{1.73_m2} — ABNORMAL LOW (ref >59)
Glucose: 104 mg/dL — ABNORMAL HIGH (ref 65–99)
Potassium: 3.8 mmol/L (ref 3.5–5.2)
Sodium: 141 mmol/L (ref 134–144)

## 2011-03-16 LAB — BRAIN NATRIURETIC PEPTIDE: BNP: 625.7 pg/mL — ABNORMAL HIGH (ref 0.0–100.0)

## 2011-03-17 ENCOUNTER — Telehealth: Payer: Self-pay

## 2011-03-17 ENCOUNTER — Other Ambulatory Visit: Payer: Medicare Other | Admitting: Internal Medicine

## 2011-03-17 NOTE — Telephone Encounter (Signed)
Patient wanted to verify medications.

## 2011-03-18 ENCOUNTER — Other Ambulatory Visit: Payer: Self-pay | Admitting: Internal Medicine

## 2011-03-20 ENCOUNTER — Telehealth: Payer: Self-pay | Admitting: Cardiovascular Disease

## 2011-03-20 MED ORDER — TRAMADOL HCL 50 MG PO TABS: ORAL_TABLET | ORAL | Status: DC

## 2011-03-20 NOTE — Telephone Encounter (Signed)
Pt wants to speak to nurse concerning her medication. Pt has been taking double the amount of lasix.

## 2011-03-23 NOTE — Telephone Encounter (Signed)
Attempted to contact pt, LMOM TCB. Pt had left msg on vm stating that she has been wheezing for <2 days now, not getting better with nebulizer and inhaler. Her HR ranging 90-94 (same as in office) and we had incr her bystolic to 5 BID. BP 130/78 per msg. Any rec's prior to her calling back?

## 2011-03-23 NOTE — Telephone Encounter (Signed)
Attempted to contact pt, LMOM TCB.  

## 2011-03-23 NOTE — Telephone Encounter (Signed)
If sx continue, she may want to contact pulmonary Uncertain if she needs steroids

## 2011-03-24 ENCOUNTER — Telehealth: Payer: Self-pay | Admitting: Pulmonary Disease

## 2011-03-24 NOTE — Telephone Encounter (Signed)
Called and spoke with pt. She states that she thought that her SOB and wheezing had started to improve, but started wheezing again 3 days ago- esp in late evening and states that she is SOB "all the time now".  She states that she has some prod cough as well, with clear sputum and has noticed left side rib pain when she lies down at night for the past 2 nights. She is sched with Dr Kendrick Fries for 04/03/11, but I advised if having all of these symptoms should be evaluated sooner and I have sched her appt with TP for Monday 03/27/11. I advised that she should go to ED sooner should symptoms persist/worsen and she verbalized udnerstanding but did not feel this would be necc. I am forwarding msg to Dr Craige Cotta (doc of the day) for any further recs.

## 2011-03-24 NOTE — Telephone Encounter (Signed)
Molli Knock- will forward to Dr Kendrick Fries so he is aware what's going on.

## 2011-03-24 NOTE — Telephone Encounter (Signed)
Agree with plan as outlined.

## 2011-03-24 NOTE — Telephone Encounter (Signed)
Notified pt. She did not wheeze last night, but chest still tight. She has f/u with Dr. Kendrick Fries on 11/28, but if sx continue through Monday, she will call pulmonary.

## 2011-03-27 ENCOUNTER — Encounter: Payer: Self-pay | Admitting: Adult Health

## 2011-03-27 ENCOUNTER — Ambulatory Visit (INDEPENDENT_AMBULATORY_CARE_PROVIDER_SITE_OTHER): Payer: Medicare Other | Admitting: Adult Health

## 2011-03-27 VITALS — BP 112/78 | HR 104 | Temp 95.6°F | Ht 65.5 in | Wt 160.2 lb

## 2011-03-27 DIAGNOSIS — R059 Cough, unspecified: Secondary | ICD-10-CM

## 2011-03-27 DIAGNOSIS — R05 Cough: Secondary | ICD-10-CM

## 2011-03-27 MED ORDER — PREDNISONE 10 MG PO TABS: ORAL_TABLET | ORAL | Status: DC

## 2011-03-27 MED ORDER — MOMETASONE FUROATE 220 MCG/INH IN AEPB: 1.0000 | INHALATION_SPRAY | Freq: Two times a day (BID) | RESPIRATORY_TRACT | Status: DC

## 2011-03-27 MED ORDER — LEVALBUTEROL HCL 1.25 MG/3ML IN NEBU: 1.0000 | INHALATION_SOLUTION | RESPIRATORY_TRACT | Status: DC | PRN

## 2011-03-27 MED ORDER — MOMETASONE FUROATE 220 MCG/INH IN AEPB: 2.0000 | INHALATION_SPRAY | Freq: Two times a day (BID) | RESPIRATORY_TRACT | Status: DC

## 2011-03-27 NOTE — Progress Notes (Signed)
Subjective:    Patient ID: Ashley [name], female    DOB: 1945/07/07, 65 y.o.   MRN: [id number]  HPI 57  yowf never smoker "coughed all her life"  Assoc with tendency to daily throat clearing  diagnosed with COPD presented to [facility] clinic for evaluation 02/20/11  02/20/11 Initial Consult -[place]  She states that overall worse for the last seven or eight years she has had significant mostly dry cough, wheezing, and mild dyspnea when exposed to pollen, dust, and perfumes.  She has been evaluated by an allergist for rhinitis but she says she has never been diagnosed with asthma.  She describes undergoing a metacholine challenge test  08/05/09 which did not produce symptoms and was negative by standard criteria..  The last time she was only bothered by throat clearing and not requiring medications was March 2012 then got bronchitis "on the flight back"  From [place] and downhill since   Cough is worse during the day, mostly mucoid, minimally productive assoc with sorethroat and mild dysphagia but no overt sinus complaints  After taking tussionex Sleeping ok without nocturnal  or early am exacerbation  of respiratory  c/o's or need for noct saba. Also denies any obvious fluctuation of symptoms with weather or environmental changes or other aggravating or alleviating factors except as outlined above   Doesn't think any of her inhalers help. >>REC: Cough control , taper off off prednsione , delsym /tramadol for cough, pepcid qhs, chlor tabs every 6hr .    03/10/2011 Follow up and Med Review Pt returns for follow up and med review.  We reviewed all her meds and organized them into a med calendar.  She has a full large bags of meds, old /new , empty bottles and bottles of meds poured into nonmarked containers. Advised of dangers of unlabeled med containers. " I still have good mind and know what I am taking"   Since last ov she has underwent 2 D echo that showed  Cardiomyopathy and depressed  ejection fraction estimated at 25-35%, MV regurg. She underwent stress myoview yesterday , results pending. She has been seen by Dr. Mariah Milling and started on bystolic for cardiomyopathy. Started on lasix 40mg  daily    Seen 2 weeks ago for cough, recommended to start cough protocol with tramadol and delsym , gerd prevention with pepcid and prilosec and chlor tabs for drainage.  She is using delsym and tramadol but cough is unchanged . Did not start pepcid. Unable to tolerate chlor tabs for drainage.  >>med calendar and rx pepcid At bedtime    03/27/2011 Acute OV  Complains of wheezing, increased SOB, prod cough with clear mucus, hoarseness x5 days  .  Was seen by ENT/Allergist - Dr. Elenore Rota -In [place] - rx Asmanex. 2 weeks ago. Says cough has not went away.  Tramadol and delysm does help with cough but it comes right back  Underwent myoview couple of weeks ago  By Cards with no signs of ischemia.    ROS:   Constitutional:   No  weight loss, night sweats,  Fevers, chills,  +fatigue, or  lassitude.  HEENT:   No headaches,  Difficulty swallowing,  Tooth/dental problems, or  Sore throat,                No sneezing, itching, ear ache,  +nasal congestion, post nasal drip,   CV:  No chest pain,  Orthopnea, PND, swelling in lower extremities, anasarca, dizziness, palpitations, syncope.   GI  No heartburn,  indigestion, abdominal pain, nausea, vomiting, diarrhea, change in bowel habits, loss of appetite, bloody stools.   Resp:  No coughing up of blood.  No change in color of mucus.   No chest wall deformity  Skin: no rash or lesions.    MS:  No joint pain or swelling.  No decreased range of motion.    Psych:  No change in mood or affect. No depression or anxiety.  No memory loss.          Objective:   Physical Exam GEN: A/Ox3; pleasant , NAD, elderly   HEENT:  [place]/AT,  EACs-clear, TMs-wnl, NOSE-clear, THROAT-clear, no lesions, no postnasal drip or exudate noted.   NECK:  Supple w/  fair ROM; no JVD; normal carotid impulses w/o bruits; no thyromegaly or nodules palpated; no lymphadenopathy.  RESP  Clear  P & A; w/o, wheezes/ rales/ or rhonchi.no accessory muscle use, no dullness to percussion  CARD:  RRR, faint 1/6 SM  , tr  peripheral edema, pulses intact, no cyanosis or clubbing.  GI:   Soft & nt; nml bowel sounds; no organomegaly or masses detected.  Musco: Warm bil, no deformities or joint swelling noted.   Neuro: alert, no focal deficits noted.    Skin: Warm, no lesions or rashes     Assessment & Plan:

## 2011-03-27 NOTE — Assessment & Plan Note (Addendum)
Suspect this is multifactoral in nature with triggers of drainage and GERD. -slow to resolve.   Plan:  Short course of prednisone taper over the next week.  Continue on Asmanex as directed  Continue on Delysm and Tramadol As needed  For cough.  Please contact office for sooner follow up if symptoms do not improve or worsen or seek emergency care  follow up .DR Kendrick Fries next week as planned and As needed

## 2011-03-27 NOTE — Patient Instructions (Signed)
Short course of prednisone taper over the next week.  Continue on Asmanex as directed  Continue on Delysm and Tramadol As needed  For cough.  Please contact office for sooner follow up if symptoms do not improve or worsen or seek emergency care  follow up .DR Kendrick Fries next week as planned and As needed

## 2011-03-27 NOTE — Progress Notes (Signed)
Addended by: Boone Master E on: 03/27/2011 03:44 PM   Modules accepted: Orders

## 2011-03-28 ENCOUNTER — Telehealth: Payer: Self-pay | Admitting: Internal Medicine

## 2011-03-28 NOTE — Telephone Encounter (Signed)
Patient with multiple medical problems since this summer.  She is having abdominal pain that worsens as the day goes on.  She reports that Dr Juanda Chance had her set up for EGD earlier this summer and had to cancel due to repeated pneumonia, heart and renal issues.  She is scheduled to come in and see Dr Juanda Chance 05/30/10 10:15

## 2011-03-29 ENCOUNTER — Encounter: Payer: Self-pay | Admitting: *Deleted

## 2011-03-31 ENCOUNTER — Encounter: Payer: Self-pay | Admitting: Internal Medicine

## 2011-03-31 ENCOUNTER — Ambulatory Visit (INDEPENDENT_AMBULATORY_CARE_PROVIDER_SITE_OTHER): Payer: Medicare Other | Admitting: Internal Medicine

## 2011-03-31 VITALS — BP 118/78 | HR 104 | Ht 65.0 in | Wt 158.2 lb

## 2011-03-31 DIAGNOSIS — K219 Gastro-esophageal reflux disease without esophagitis: Secondary | ICD-10-CM

## 2011-03-31 DIAGNOSIS — K3189 Other diseases of stomach and duodenum: Secondary | ICD-10-CM

## 2011-03-31 DIAGNOSIS — R1013 Epigastric pain: Secondary | ICD-10-CM

## 2011-03-31 NOTE — Progress Notes (Signed)
Ashley [name] 20-Sep-1945 MRN [id number]    History of Present Illness:  This is a 65 year old white female with a recent hospitalization for pneumonia; she was set up for upper endoscopy to evaluate gastroesophageal reflux but could not keep the appointment because she was admitted to [facility] at the time. She is followed  by cardiology as well as pulmonary medicine. Her ejection fraction was calculated as 35%. Her GI complaints include bloating, distention and change in bowel habits. Her last colonoscopy in 2005 was a difficult exam due to a tortuous colon and diverticulosis. Her last upper abdominal ultrasound in 2006 showed a normal gallbladder. She is on omeprazole 20 mg twice a day and famotidine 20 mg a day.   Past Medical History  Diagnosis Date  . Lump or mass in breast   . Carpal tunnel syndrome   . Migraine without aura, without mention of intractable migraine without mention of status migrainosus   . Diarrhea   . Diverticulosis of colon     no diverticulitis  . Fibromyalgia   . Genital herpes, unspecified   . Pure hypercholesterolemia   . Irritable bowel syndrome   . Laryngeal nodule     Dr. Thelma Comp  . Fibrocystic breast disease   . Osteoarthritis   . Genital herpes   . Hemorrhoids   . Anxiety   . GERD (gastroesophageal reflux disease)   . Systolic CHF     EF 25-35%, LV dilation, significant MR  . Nonischemic cardiomyopathy   . Pneumonia   . Mitral regurgitation   . Anxiety   . Pleural effusion, bilateral 2012   Past Surgical History  Procedure Date  . Appendectomy   . Mandible surgery   . Knee arthroscopy w/ orif     Left  . Osteotomy     Left foot  . 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 Total abdominal hysterectomy w/ bilateral salpingoophorectomy 1987    Endometriosis  . Dexa 03/28/99    osteopenia L/S  . Colonoscopy 1998    nml (sigmoid-Gilbert-nol)  . Head mri, head ct 10/1998    ? H/A focus ([facility])  . Emg/mcv 10/16/01    +  mild carpal tunnel  . Dexa 03/12/03    2.2 Spine 0.1 Hip  Osteopenia  . Mri right hip 12/12/01    Tendonitis Gluteus Medius to Gtr Trochanter Neg Bursitis (Dr. Montez Morita)  . L/s films 11/21/01    nml; Right hip nml  . Mri u/s 11/21/01    min. DDD L/3-4  . Colonoscopy 01/04/04    polyps, diverticulosis/severe  . Dexa 11/14/05    1.9 Spine 0.3 Hip  slight improvement  . Flex laryngoscopy 06/11/06    ([facility]) nml  . Dexa 11/22/06    improved osteo and fem neck  . [facility] bunionectomy 11/04/08    Right Great Toe (Dr. Lestine Box)  . Breast mass excision 01/2010    Left-fibrocystic change w/intraductal papilloma, no malignancy  . Ct chest     12/2010 - possible bronchiectasis lower lobes, 02/2011 - enlarged bilateral effusions, bibasilar atx  . Stress myoview 02/2011    no significant ischemia    reports that she has never smoked. She has never used smokeless tobacco. She reports that she does not drink alcohol or use illicit drugs. family history includes Alcohol abuse in her paternal grandfather; Arthritis in her mother; Dementia in her mother; Emphysema in her mother; Heart disease in some unspecified family members; Hypertension in her  maternal grandfather; Lung cancer in her father; Osteoporosis in her mother; and Stroke in her mother and paternal grandfather. Allergies  Allergen Reactions  . Antihistamines, Chlorpheniramine-Type   . Atorvastatin     REACTION: muscle cramps  . Influenza Vac Split (Flu Virus Vaccine) Other (See Comments)    Body aches  . Latex   . Milk-Related Compounds     REACTION: coughing, but does drink.  . Penicillins     REACTION: immune to it, able to take amoxicillin  . Promethazine Hcl     REACTION: Dizzy, headache  . Rosuvastatin     REACTION: not effective  . Sulfasalazine         Review of Systems: Denies dysphagia heartburn or odynophagia. Positive for small caliber stools and occasional constipation  The remainder of the 10 point ROS is negative except  as outlined in H&P   Physical Exam: General appearance  Well developed, in no distress., Appears cushingoid Eyes- non icteric. HEENT nontraumatic, normocephalic. Mouth no lesions, tongue papillated, no cheilosis. Neck supple without adenopathy, thyroid not enlarged, no carotid bruits, no JVD. Lungs Clear to auscultation bilaterally. Decreased breath sounds bilaterally. No expiratory wheezes. Increased AP diameter Cor normal S1, normal S2, regular rhythm, no murmur,  quiet precordium. Abdomen: Protuberant but soft with marked tenderness in epigastrium. Normal active bowel sounds. No tympany. Rectal: Soft Hemoccult negative stool. Extremities no pedal edema. Skin no lesions. Neurological alert and oriented x 3. Psychological normal mood and affect.  Assessment and Plan:  Problem #1 gastroesophageal reflux adequately controlled on PPI and Pepcid. She was scheduled for an upper endoscopy to rule out Barrett's esophagus but she has serious concern about her respiratory problems and would like not to proceed with the exam.  Problem #2 history of diverticulosis. Patient is up-to-date on her colonoscopy. She would have to be done with propofol if she develops specific problems. We have discussed the fact that the pulmonary problems impact her GI function indirectly because of multiple medications, aerophagia and stress., which may result in bloating, discomfort and delayed gastric emptying.   03/31/2011 [name]

## 2011-03-31 NOTE — Patient Instructions (Addendum)
CC: Dr Morton Stall, Dr Concha Se, Dr Kendrick Fries

## 2011-04-03 ENCOUNTER — Encounter: Payer: Self-pay | Admitting: Pulmonary Disease

## 2011-04-03 ENCOUNTER — Ambulatory Visit (INDEPENDENT_AMBULATORY_CARE_PROVIDER_SITE_OTHER): Payer: Medicare Other | Admitting: Pulmonary Disease

## 2011-04-03 VITALS — BP 130/82 | HR 82 | Temp 97.9°F | Ht 65.5 in | Wt 155.8 lb

## 2011-04-03 DIAGNOSIS — R5383 Other fatigue: Secondary | ICD-10-CM

## 2011-04-03 DIAGNOSIS — R05 Cough: Secondary | ICD-10-CM

## 2011-04-03 DIAGNOSIS — R0989 Other specified symptoms and signs involving the circulatory and respiratory systems: Secondary | ICD-10-CM

## 2011-04-03 DIAGNOSIS — R5381 Other malaise: Secondary | ICD-10-CM

## 2011-04-03 DIAGNOSIS — I5020 Unspecified systolic (congestive) heart failure: Secondary | ICD-10-CM

## 2011-04-03 DIAGNOSIS — R059 Cough, unspecified: Secondary | ICD-10-CM

## 2011-04-03 DIAGNOSIS — R0602 Shortness of breath: Secondary | ICD-10-CM | POA: Insufficient documentation

## 2011-04-03 DIAGNOSIS — R0609 Other forms of dyspnea: Secondary | ICD-10-CM

## 2011-04-03 DIAGNOSIS — R0600 Dyspnea, unspecified: Secondary | ICD-10-CM

## 2011-04-03 MED ORDER — HYDRALAZINE HCL 25 MG PO TABS: 10.0000 mg | ORAL_TABLET | Freq: Three times a day (TID) | ORAL | Status: DC

## 2011-04-03 NOTE — Patient Instructions (Signed)
Start taking hydralazine 10mg  by mouth three times a day. Stop taking Asthmanex. We will refer you for a sleep study. We will refer you for pulmonary rehab. We will schedule an appointment for you in 6-8 weeks.

## 2011-04-03 NOTE — Assessment & Plan Note (Signed)
I think that this is contributing to her orthopnea, dyspnea, and wheezing.  She appears to be volume overloaded per my exam today in clinic and she shows me several weeks of blood pressure measurements, most of which are above 130 to 140 systolic.  Given her problems with cough, I do not think that adding an ACE inhibitor is wise at this point.  I will add hydralazine 10mg  po bid for improved afterload reduction.

## 2011-04-03 NOTE — Assessment & Plan Note (Signed)
Likely related to heart failure with likely some component of GERD.  Given that she is on maximum therapy for reflux and continues to cough, I think that the next best work up would be an esophageal impedence probe.  She is not willing to have this done at this time.   Perhaps with treatment of heart failure we will see some improvement.

## 2011-04-03 NOTE — Progress Notes (Signed)
Subjective:    Patient ID: Ashley [name], female    DOB: Aug 20, 1945, 65 y.o.   MRN: [id number]  Synopsis: 65 y/o female with chronic cough, intermittent wheezing, and multiple hospitalizations for pneumonia presented to our clinic initially on 02/20/11 for evaluation of the same. She has seen an allergist for years for allergic rhinitis and had been told that she did not have asthma.  We ordered an echocardiogram which showed significanly decreased LV function.  She underwent a cardiac myoview which showed no evidence of ischaemia.  She has been seen in our [place] office multiple times for cough since.  She has not taken the chlortrimeton we recommended.  She was seen by a gastroenterologist in the interim who recommended that she continue taking pepcid and famotidine.  In approximately the end of 10/12 she was put on asthmanex by her allergist.  On 11/12 she was given a prenisone taper for continued cough.  HPI Ashley [name] has had a busy 6 weeks since our last visit as detailed above.  She has multiple complaints today, but her primary respiratory complaints are wheezing, shortness of breath, and continuous cough productive of clear sputum.  She says that the symptoms are worse at night when she lies flat, so she has to prop her head up.  She is a little more swollen than usual.  She says that the tramadol makes her stomach hurt.  She doesn't think that the pepcid helps.  Her allergist put her on Asthmanex but she doesn't think that it has helped.  She was put on a prednisone taper last week and she says that this has not helped her wheeze or shortness of breath at all.  She wheezes nearly continuously.  No fevers or chills.    She notes that she frequently wakes up many times during the night choking and gasping for air.  Her husband says that she stops breathing at times.  She also notes waking up choking frequently in the middle of the night.    Her sinuses have been well controlled  recently.    Review of Systems  Constitutional: Negative for chills and fatigue.  HENT: Negative for hearing loss, congestion, facial swelling and rhinorrhea.   Eyes: Negative.   Respiratory: Positive for cough, chest tightness, shortness of breath and wheezing.   Cardiovascular: Positive for leg swelling. Negative for chest pain.  Genitourinary: Negative.  Negative for difficulty urinating.  Musculoskeletal: Positive for back pain and arthralgias.  Neurological: Positive for headaches.  Hematological: Negative.   Psychiatric/Behavioral: Negative.         Objective:   Physical Exam  Filed Vitals:   04/03/11 1621  BP: 130/82  Pulse: 82  Temp: 97.9 F (36.6 C)  TempSrc: Oral  Height: 5' 5.5" (1.664 m)  Weight: 155 lb 12.8 oz (70.67 kg)  SpO2: 94%    Gen: lying in the fetal position on the exam table, anxious HEENT: NCAT, PERRL, EOMi, OP clear, neck supple without masses, TM's clear PULM: CTA B, occasional upper airway end exp wheeze which clears with my instruction GEN: RRR, S3 noted, no JVD AB: BS+, soft, nontender, no hsm Ext: warm, moderate pitting edema to mid shin bilaterally, no clubbing, no cyanosis Derm: no rash or skin breakdown Neuro: A&Ox4, CN II-XII intact, strength 5/5 in all 4 extremeties  In office spirometry today: Good effort, F/F ratio 65%, FEV1 1.68 L (69%); flow volume loop with scooping consistent with moderate obstruction     Assessment & Plan:  Impression:  1) Shortness of breath 2) CHF, systolic 3) GERD 4) Cough 5) Likely vocal cord dysfunction 6) Possible OSA  Cough Likely related to heart failure with likely some component of GERD.  Given that she is on maximum therapy for reflux and continues to cough, I think that the next best work up would be an esophageal impedence probe.  She is not willing to have this done at this time.   Perhaps with treatment of heart failure we will see some improvement.  Systolic CHF I think that this is  contributing to her orthopnea, dyspnea, and wheezing.  She appears to be volume overloaded per my exam today in clinic and she shows me several weeks of blood pressure measurements, most of which are above 130 to 140 systolic.  Given her problems with cough, I do not think that adding an ACE inhibitor is wise at this point.  I will add hydralazine 10mg  po bid for improved afterload reduction.    Shortness of breath In the last several weeks she has struggled with dyspnea on exertion, wheezing, and cough.  While asthma seems like a reasonable diagnosis in this scenario, I believe that there is little evidence to support this.  In the past she has had a negative metacholine challenge test which strongly argues against asthma.  She recently has been put on a prednisone taper and asthmanex and state that neither are helpful. She states that proventil is not helpful.  Further, she states that she is quite symptomatic today but yet she has no wheezing, no signs of respiratory distress, and is talking to me and walking in the office quite comfortably, all arguing against an acute asthma exacerbation.  She has obstruction on today's spirometry.   I think that a likely explanation for her obstruction on PFTs is heart failure and volume overload as she has had no response to typical asthma therapy and her exam is more consistent with this today.  Typically heart failure causes obstruction in the acute setting and restriction in the chronic setting.  Given this, I will focus changing therapy on her heart failure and afterload reduction.  We may need to consider stopping the betablocker in the future her wheezing and dyspnea persist (albeit it is Bystolic).  I also believe that she has a component of vocal cord dysfunction as she had a variable, upper airway wheeze which cleared with my instruction (and would fit with her degree of anxiety).  Given her symptoms of choking, apnea and poor sleep, we will order a  sleep study.  She appears deconditioned, so we will request pulmonary rehab.  Her anxiety certainly complicates her therapy, consider mental health referral in the future.  Return to clinic in 6-8 weeks.  Updated Medication List Outpatient Encounter Prescriptions as of 04/03/2011  Medication Sig Dispense Refill  . ALPRAZolam (XANAX) 0.25 MG tablet Take 0.25 mg by mouth. Takes half a pill prn      . aspirin 81 MG EC tablet Take 81 mg by mouth at bedtime.       Marland Kitchen atorvastatin (LIPITOR) 10 MG tablet Take 1 tablet (10 mg total) by mouth daily. Brand medically necessary.  30 tablet  11  . dextromethorphan (DELSYM) 30 MG/5ML liquid 2 tsp every 12 hours as needed       . furosemide (LASIX) 20 MG tablet 1/2 tablet twice daily      . HYDROcodone-acetaminophen (VICODIN ES) 7.5-750 MG per tablet Take 1 tablet by mouth every 6 (six)  hours as needed.        . levalbuterol (XOPENEX) 1.25 MG/3ML nebulizer solution Take 1.25 mg by nebulization every 4 (four) hours as needed.  90 mL  5  . mometasone (NASONEX) 50 MCG/ACT nasal spray Place 1 spray into the nose 2 (two) times daily.       . nebivolol (BYSTOLIC) 5 MG tablet Take 1 tablet (5 mg total) by mouth daily.  30 tablet    . omeprazole (PRILOSEC) 40 MG capsule Take 40 mg by mouth 2 (two) times daily.        . VENTOLIN HFA 108 (90 BASE) MCG/ACT inhaler Take 2 puffs by mouth Every 4 hours as needed.      . zolpidem (AMBIEN) 5 MG tablet 1/2 to 1 tablet at bedtime as needed For insomnia        . DISCONTD: mometasone (ASMANEX) 220 MCG/INH inhaler Inhale 1 puff into the lungs 2 (two) times daily.      . Cholecalciferol (VITAMIN D3) 400 UNITS CAPS Take 3 capsules by mouth daily.       . hydrALAZINE (APRESOLINE) 25 MG tablet Take 0.5 tablets (12.5 mg total) by mouth 3 (three) times daily.  120 tablet  11  . naproxen sodium (ANAPROX) 220 MG tablet Per bottle      . vitamin C (ASCORBIC ACID) 500 MG tablet Take 500 mg by mouth daily.        Marland Kitchen DISCONTD:  famotidine (PEPCID) 20 MG tablet One at bedtime      . DISCONTD: predniSONE (DELTASONE) 10 MG tablet 4 tabs for 2 days, then 3 tabs for 2 days, 2 tabs for 2 days, then 1 tab for 2 days, then stop  20 tablet  0  . DISCONTD: traMADol (ULTRAM) 50 MG tablet 1 to 2 every 4 hours as needed for cough or pain  40 tablet  0

## 2011-04-03 NOTE — Assessment & Plan Note (Signed)
In the last several weeks she has struggled with dyspnea on exertion, wheezing, and cough.  While asthma seems like a reasonable diagnosis in this scenario, I believe that there is little evidence to support this.  In the past she has had a negative metacholine challenge test which strongly argues against asthma.  She recently has been put on a prednisone taper and asthmanex and state that neither are helpful. She states that proventil is not helpful.  Further, she states that she is quite symptomatic today but yet she has no wheezing, no signs of respiratory distress, and is talking to me and walking in the office quite comfortably, all arguing against an acute asthma exacerbation.  She has obstruction on today's spirometry.   I think that a likely explanation for her obstruction on PFTs is heart failure and volume overload as she has had no response to typical asthma therapy and her exam is more consistent with this today.  Typically heart failure causes obstruction in the acute setting and restriction in the chronic setting.  Given this, I will focus changing therapy on her heart failure and afterload reduction.  We may need to consider stopping the betablocker in the future her wheezing and dyspnea persist (albeit it is Bystolic).  I also believe that she has a component of vocal cord dysfunction as she had a variable, upper airway wheeze which cleared with my instruction (and would fit with her degree of anxiety).

## 2011-04-04 ENCOUNTER — Telehealth: Payer: Self-pay | Admitting: Pulmonary Disease

## 2011-04-04 NOTE — Telephone Encounter (Signed)
H[facility]ralazine was [facility] in [place] and not [place]. Need to verify which pharmacy this needs to go to. LMOMTCB.

## 2011-04-04 NOTE — Telephone Encounter (Signed)
lmomtcb  

## 2011-04-04 NOTE — Telephone Encounter (Signed)
Pt stated she would [facility] [place].  Pt stated she should also have a Rx for a Beta Blocker in addition to hydralazine.  Pt's number is [phone].  Antionette Fairy

## 2011-04-05 ENCOUNTER — Telehealth: Payer: Self-pay | Admitting: Pulmonary Disease

## 2011-04-05 NOTE — Telephone Encounter (Signed)
lmomtcb  

## 2011-04-05 NOTE — Telephone Encounter (Signed)
This is a duplicate msg. See new phone note dated 04/05/11.

## 2011-04-05 NOTE — Telephone Encounter (Signed)
Have reviewed chart.  She has known CM, and most recently saw DR. McQuaid who felt she was in decompensated heart failure.  meds changed, and raised question if Beta blocker needed to be stopped.  STill having issues.  Based on BQ's recent note, would stop bystolic and she needs to get an apptm soon with her cardiologist. If she worsens in the interim, needs to go to ER.

## 2011-04-05 NOTE — Telephone Encounter (Signed)
Pt says her dyspnea and wheezing are worse tham when seen at [facility] on 04/03/11. At that visit, Dr. Kendrick Fries had said they may have to consider stopping the Bystolic if these symptoms persisted. She says her breathing is worse at night and she is not sleeping well. I will forward this msg to our doc of the day, Dr. Shelle Iron for recs. Pls advise.

## 2011-04-05 NOTE — Telephone Encounter (Signed)
lmomtcb x1 

## 2011-04-07 ENCOUNTER — Other Ambulatory Visit: Payer: Self-pay

## 2011-04-07 ENCOUNTER — Telehealth: Payer: Self-pay | Admitting: Cardiovascular Disease

## 2011-04-07 DIAGNOSIS — R0602 Shortness of breath: Secondary | ICD-10-CM

## 2011-04-07 DIAGNOSIS — Z79899 Other long term (current) drug therapy: Secondary | ICD-10-CM

## 2011-04-07 DIAGNOSIS — R609 Edema, unspecified: Secondary | ICD-10-CM

## 2011-04-07 NOTE — Telephone Encounter (Signed)
Ashley [name], Lets recheck her BMP and BNP on the higher dose lasix (BID) Notes indicate her weight is down 6 lbs. Ok to check on Monday. Suggest she continue to write down BP and heart rate to help with medication titration.

## 2011-04-07 NOTE — Telephone Encounter (Signed)
The patient was told to come off the bystolic after having the breathing test. She will come off the bystolic today per Dr. Valetta Fuller. She was instructed to take Hydralazine 25 mg 1/2 tablet tid. She started hydralazine on Tues, Nov. 20, 2012. She would like to be seen ASAP to discuss her wheezing and shortness of breath with any movement her heart aches.  She is scheduled for a sleep apnea test Dec. 4, 2012.  Cell phone [phone].

## 2011-04-07 NOTE — Telephone Encounter (Signed)
Pt advised of recs. She states she will call her cardiologists today. Carron Curie, CMA

## 2011-04-07 NOTE — Telephone Encounter (Signed)
Pt saw Dr Valetta Fuller and is still having some wheezing. Dr Valetta Fuller told her that her wheezing was cardiac related. Pt not feeling good or sleeping well. Chest discomfort. Pt had pneumonia in the hospital and was told she went in to afib and heart failure.

## 2011-04-09 NOTE — Telephone Encounter (Signed)
While stopping bistolic may make sense at this point, please note that I never told her to do that.  I'm struggling to find evidence of asthma, but she does have some obstruction on PFT's which may be due in part to bistolic.  I can see her later in the week if needed.

## 2011-04-10 ENCOUNTER — Ambulatory Visit (INDEPENDENT_AMBULATORY_CARE_PROVIDER_SITE_OTHER): Payer: Medicare Other | Admitting: *Deleted

## 2011-04-10 DIAGNOSIS — R0602 Shortness of breath: Secondary | ICD-10-CM

## 2011-04-10 DIAGNOSIS — Z79899 Other long term (current) drug therapy: Secondary | ICD-10-CM

## 2011-04-10 DIAGNOSIS — R609 Edema, unspecified: Secondary | ICD-10-CM

## 2011-04-10 NOTE — Telephone Encounter (Signed)
See Dr. Ulyses Jarred note.

## 2011-04-11 ENCOUNTER — Other Ambulatory Visit: Payer: Self-pay

## 2011-04-11 LAB — BASIC METABOLIC PANEL
BUN/Creatinine Ratio: 18 (ref 11–26)
BUN: 18 mg/dL (ref 8–27)
CO2: 23 mmol/L (ref 20–32)
Calcium: 10 mg/dL (ref 8.6–10.2)
Chloride: 106 mmol/L (ref 97–108)
Creatinine, Ser: 0.99 mg/dL (ref 0.57–1.00)
GFR calc Af Amer: 69 mL/min/{1.73_m2} (ref >59)
GFR calc non Af Amer: 60 mL/min/{1.73_m2} (ref >59)
Glucose: 88 mg/dL (ref 65–99)
Potassium: 3 mmol/L — ABNORMAL LOW (ref 3.5–5.2)
Sodium: 143 mmol/L (ref 134–144)

## 2011-04-11 LAB — BRAIN NATRIURETIC PEPTIDE: BNP: 497.2 pg/mL — ABNORMAL HIGH (ref 0.0–100.0)

## 2011-04-11 NOTE — Progress Notes (Signed)
Opened in error

## 2011-04-12 ENCOUNTER — Other Ambulatory Visit: Payer: Self-pay | Admitting: Cardiovascular Disease

## 2011-04-12 ENCOUNTER — Ambulatory Visit: Payer: Medicare Other | Admitting: Pulmonary Disease

## 2011-04-12 DIAGNOSIS — I5020 Unspecified systolic (congestive) heart failure: Secondary | ICD-10-CM

## 2011-04-12 DIAGNOSIS — E876 Hypokalemia: Secondary | ICD-10-CM

## 2011-04-12 MED ORDER — POTASSIUM CHLORIDE ER 10 MEQ PO TBCR: EXTENDED_RELEASE_TABLET | ORAL | Status: DC

## 2011-04-14 ENCOUNTER — Telehealth: Payer: Self-pay

## 2011-04-14 NOTE — Telephone Encounter (Signed)
The patient dropped off blood pressure readings and heart rates for the week: She started on Hydralazine 25 mg 1/2 tablet tid. 04/02/11 140/79 HR 96 @ 9:00 am, new medication 11/20 120/78 HR 99 heart pounding, 11/22 113/74 HR 96, 11/24 132/74 HR 102, after taking medication 118/69 HR 99, 11/24 151/82 HR 102, 11/25 132 62 HR 102 @ 4:40 pm, 142/76 HR 106 @ 5:20 pm. 135/82 HR 102 8:25 pm, 137/78 HR 99 @ 10:45 pm.

## 2011-04-16 NOTE — Telephone Encounter (Signed)
Need to start lisinopril 10 mg daily To improve cardiac function

## 2011-04-17 ENCOUNTER — Telehealth: Payer: Self-pay

## 2011-04-17 MED ORDER — ISOSORBIDE DINITRATE 30 MG PO TABS: ORAL_TABLET | ORAL | Status: DC

## 2011-04-17 NOTE — Telephone Encounter (Signed)
LMOMTC

## 2011-04-17 NOTE — Telephone Encounter (Signed)
Refill sent for isosorbide dinitrate 30 mg take 1/2 tablet tid.

## 2011-04-17 NOTE — Telephone Encounter (Signed)
Notified patient need to start on isosorbide dinitrate 30 mg take 1/2 tablet tid per Dr. Mariah Milling for better heart function control.  Dr. Mariah Milling changed the plan for lisinopril to isosorbide dinitrate because Ashley [name] has a dry cough.

## 2011-04-18 ENCOUNTER — Ambulatory Visit: Payer: Self-pay | Admitting: Pulmonary Disease

## 2011-04-19 ENCOUNTER — Ambulatory Visit (INDEPENDENT_AMBULATORY_CARE_PROVIDER_SITE_OTHER): Payer: Medicare Other | Admitting: *Deleted

## 2011-04-19 DIAGNOSIS — I5020 Unspecified systolic (congestive) heart failure: Secondary | ICD-10-CM

## 2011-04-19 DIAGNOSIS — E876 Hypokalemia: Secondary | ICD-10-CM

## 2011-04-20 LAB — BASIC METABOLIC PANEL
BUN/Creatinine Ratio: 18 (ref 11–26)
BUN: 15 mg/dL (ref 8–27)
CO2: 20 mmol/L (ref 20–32)
Calcium: 10.1 mg/dL (ref 8.6–10.2)
Chloride: 103 mmol/L (ref 97–108)
Creatinine, Ser: 0.84 mg/dL (ref 0.57–1.00)
GFR calc Af Amer: 84 mL/min/{1.73_m2} (ref >59)
GFR calc non Af Amer: 73 mL/min/{1.73_m2} (ref >59)
Glucose: 91 mg/dL (ref 65–99)
Potassium: 4 mmol/L (ref 3.5–5.2)
Sodium: 140 mmol/L (ref 134–144)

## 2011-04-20 NOTE — Telephone Encounter (Signed)
Opened in error

## 2011-04-24 ENCOUNTER — Telehealth: Payer: Self-pay

## 2011-04-24 NOTE — Telephone Encounter (Signed)
Patient having wheezing again started on Thursday, had to use breathing machine once & inhaler twice during the night on Friday, she used the breathing machine twice with inhaler twice and Saturday used the  breathing machine three times with inhaler three times. She is very  shortness of breath with wheezing lasting longer and longer even after her treatment last night.  What else can she do?  Her cell phone is [phone].

## 2011-04-24 NOTE — Telephone Encounter (Signed)
Notified patient need to take an extra fluid pill per Dr. Windell Hummingbird request and to contact [facility] for further evaluation of wheezing.  The patient is concerned the wheezing is coming from her heart because of her low EF. She is hoping that we can do some blood work and another Echo sooner than January 2013 to see if her EF has worsened or if she has some fluid. Please advise if you want her to have a BNP/BMP and sooner Echo.

## 2011-04-24 NOTE — Telephone Encounter (Signed)
First would contact pulmonary ([facility] in [place]) Could take extra lasix in case this is fluid. Extra lasix in there afternoon as well. Could hold bystolic for now if she is taking, even though this is probably not contributing.

## 2011-04-25 NOTE — Telephone Encounter (Signed)
The patient will take Furosemide 20 mg take one tablet daily. She is scared her saliva glands will dry out again.

## 2011-04-25 NOTE — Telephone Encounter (Signed)
Pt calling stating that she had wheezing all night. Pt states that Dr Kendrick Fries dismissed her and told her that her problem was not pulmonary. Told her that she needed to see cardiologist. Pt wants nurse to call

## 2011-04-25 NOTE — Telephone Encounter (Signed)
The patient is going to take Furosemide 20 mg one tablet daily with cutting back on her fluid intake and salt.  She is taking Furosemide 20 mg 1/2 tablet daily for three weeks now.  She will call back with any other problems with wheezing.

## 2011-04-26 ENCOUNTER — Telehealth: Payer: Self-pay | Admitting: Pulmonary Disease

## 2011-04-26 ENCOUNTER — Ambulatory Visit (INDEPENDENT_AMBULATORY_CARE_PROVIDER_SITE_OTHER)
Admission: RE | Admit: 2011-04-26 | Discharge: 2011-04-26 | Disposition: A | Discharge status: Home / Self Care | Payer: Medicare Other | Source: Ambulatory Visit | Attending: Pulmonary Disease | Admitting: Pulmonary Disease

## 2011-04-26 ENCOUNTER — Encounter: Payer: Self-pay | Admitting: Pulmonary Disease

## 2011-04-26 ENCOUNTER — Encounter: Payer: Self-pay | Admitting: Internal Medicine

## 2011-04-26 ENCOUNTER — Telehealth: Payer: Self-pay | Admitting: Internal Medicine

## 2011-04-26 ENCOUNTER — Ambulatory Visit (INDEPENDENT_AMBULATORY_CARE_PROVIDER_SITE_OTHER): Payer: Medicare Other | Admitting: Pulmonary Disease

## 2011-04-26 VITALS — BP 112/70 | HR 110 | Temp 97.3°F | Ht 65.0 in | Wt 158.0 lb

## 2011-04-26 DIAGNOSIS — R062 Wheezing: Secondary | ICD-10-CM

## 2011-04-26 DIAGNOSIS — J383 Other diseases of vocal cords: Secondary | ICD-10-CM

## 2011-04-26 DIAGNOSIS — R0602 Shortness of breath: Secondary | ICD-10-CM

## 2011-04-26 DIAGNOSIS — J029 Acute pharyngitis, unspecified: Secondary | ICD-10-CM | POA: Insufficient documentation

## 2011-04-26 LAB — POCT INFLUENZA A/B
Influenza A, POC: NEGATIVE
Influenza B, POC: NEGATIVE

## 2011-04-26 MED ORDER — MAGIC MOUTHWASH: 5.0000 mL | Freq: Four times a day (QID) | ORAL | Status: DC | PRN

## 2011-04-26 NOTE — Telephone Encounter (Signed)
Spoke with pt. She states that her wheezing and "lung pain" have returned. Has appt with Dr.McQuaid at 4 pm for eval. I advised needs to just keep this appt, and if feels that her sx worsen in the meantime and feels can not wait to seek emergency care. Pt verbalized understanding.

## 2011-04-26 NOTE — Assessment & Plan Note (Signed)
See multiple prior notes regarding Ashley [name]'s condition.  She describes intermittent wheezing and dyspnea which are worse around certain environmental triggers (dust, smoke, fumes) and with viral infections.  She has been told that she has multiple allergies by Ashley [name] which manifest as allergic rhinitis. In March 2011 she underwent a metacholine challenge at [facility] and her FEV1 only dropped by 10% at 25mg /dl of metacholine.  Her sinus symptoms are fairly well controlled on her inhaled steroid and her sinus rinses.  Her reflux is well controlled on her omeprazole.  I suspect that her intermittent wheezing and shortness of breath is due to vocal cord dysfunction.  Often on exam in the office she has an upper airway wheeze that goes away on command or spontaneously.  In her last office visit she had moderate obstruction on PFTs, but has had no symptomatic relief with advair, spiriva (written by Ashley [name]).  Occasionally the wheezing is better with albuterol.  Given the negative metacholine challenge test the likelihood of asthma is low.  However given her allergies and symptoms I will try her on a small particle inhaler (Alvesco) at medium dose.  I would also like to refer her to Ashley [name] at [facility] for a second opinion on whether or not she has asthma or VCD.  Perhaps there she can be referred to the [facility] as well as we have no good option for VCD therapy here in [place] and [place].

## 2011-04-26 NOTE — Patient Instructions (Signed)
Use the magic mouthwash as needed for sore throat. Take tylenol or naproxen as needed for the chest soreness. Use the albuterol as needed for shortness of breath. Start taking the alvesco one puff twice a day. We will refer you to Dr. Thana Ates for a second opinion on asthma vs. Vocal cord dysfunction. We will see you back in 6-8 weeks.

## 2011-04-26 NOTE — Progress Notes (Signed)
Subjective:    Patient ID: Ashley [name], female    DOB: [birth year], 65 y.o.   MRN: [id number]  Synopsis: 65 y/o female with chronic cough, intermittent wheezing, and multiple hospitalizations for pneumonia presented to our clinic initially on 02/20/11 for evaluation of the same. She has seen an allergist for years for allergic rhinitis and had been told that she did not have asthma.  We ordered an echocardiogram which showed significanly decreased LV function.  She underwent a cardiac myoview which showed no evidence of ischaemia.  She has been seen in our [place] office multiple times for cough since.  She has not taken the chlortrimeton we recommended.  She was seen by a gastroenterologist in the interim who recommended that she continue taking pepcid and famotidine.  In approximately the end of 10/12 she was put on asthmanex by her allergist.  On 11/12 she was given a prenisone taper for continued cough.  04/03/11  ROV --Ms. Stafford has had a busy 6 weeks since our last visit as detailed above.  She has multiple complaints today, but her primary respiratory complaints are wheezing, shortness of breath, and continuous cough productive of clear sputum.  She says that the symptoms are worse at night when she lies flat, so she has to prop her head up.  She is a little more swollen than usual.  She says that the tramadol makes her stomach hurt.  She doesn't think that the pepcid helps.  Her allergist put her on Asthmanex but she doesn't think that it has helped.  She was put on a prednisone taper last week and she says that this has not helped her wheeze or shortness of breath at all.  She wheezes nearly continuously.  No fevers or chills.    She notes that she frequently wakes up many times during the night choking and gasping for air.  Her husband says that she stops breathing at times.  She also notes waking up choking frequently in the middle of the night.    Her sinuses have been well controlled  recently.   04/26/11 ROV -- Ms. Groot returns for further evaluation of shortness of breath and cough.  She states that she has had chills at home in the last week, associated with sorethroat, cough, wheezing (especially at night), clear sputum production, and chest pain with cough.  She says that she has woken up multiple times at night with wheezing which is relieved with albuterol.    She stopped taking the bystolic and feels better now (less short of breath).  She has also been taking more furosemide per Dr. Windell Hummingbird instructions.  Review of Systems  Constitutional:positive chills and fatigue.  HENT: Negative for hearing loss, notable for some congestion, facial swelling and rhinorrhea.   Eyes: Negative.   Respiratory: Positive for cough, chest tightness, shortness of breath and wheezing.   Cardiovascular: less leg swelling today.  Some mild sharp chest pain with a deep breath.  Genitourinary: Negative.  Negative for difficulty urinating.  Musculoskeletal: Positive for back pain and arthralgias.  Neurological: negative headaches.  Hematological: Negative.   Psychiatric/Behavioral: Negative.         Objective:   Physical Exam  Filed Vitals:   04/26/11 1618  BP: 112/70  Pulse: 110  Temp: 97.3 F (36.3 C)  TempSrc: Oral  Height: 5\' 5"  (1.651 m)  Weight: 158 lb (71.668 kg)  SpO2: 98%    Gen: lying in the fetal position on the exam table, anxious HEENT:  NCAT, PERRL, EOMi, OP clear, Neck supple without masses, no lymphadenopathy but some mild tenderness left submandibular area PULM: CTA B, occasional upper airway end exp wheeze which clears with my instruction GEN: RRR, no mgr, no JVD AB: BS+, soft, nontender, no hsm Ext: warm, no clubbing, no cyanosis Derm: no rash or skin breakdown  04/26/11 CXR: bronchial thickening, no infiltrate  04/26/11 Rapid flu: negative  04/03/11 In office spirometry: Good effort, F/F ratio 65%, FEV1 1.68 L (69%); flow volume loop with  scooping consistent with moderate obstruction  02/24/11 TTE: LVEF 25-30%, Mod severe MR, cannot assess RVSP    Assessment & Plan:  Impression:  1) Shortness of breath 2) CHF, systolic 3) GERD 4) Cough 5) Likely vocal cord dysfunction 6) Possible OSA  Sore throat Likely due to a viral URI given the recent subjective fevers, chills, shortness of breath.  Meets 0/4 Centor criteria for strep throat.  Rapid flu negative.  -magic mouthwash as needed for sore throat -tylenol/naproxen as needed for pain  Shortness of breath See multiple prior notes regarding Ms. Gehring's condition.  She describes intermittent wheezing and dyspnea which are worse around certain environmental triggers (dust, smoke, fumes) and with viral infections.  She has been told that she has multiple allergies by Dr. Sela Hilding which manifest as allergic rhinitis. In March 2011 she underwent a metacholine challenge at [facility] and her FEV1 only dropped by 10% at 25mg /dl of metacholine.  Her sinus symptoms are fairly well controlled on her inhaled steroid and her sinus rinses.  Her reflux is well controlled on her omeprazole.  I suspect that her intermittent wheezing and shortness of breath is due to vocal cord dysfunction.  Often on exam in the office she has an upper airway wheeze that goes away on command or spontaneously.  In her last office visit she had moderate obstruction on PFTs, but has had no symptomatic relief with advair, spiriva (written by Dr. Sela Hilding).  Occasionally the wheezing is better with albuterol.  Given the negative metacholine challenge test the likelihood of asthma is low.  However given her allergies and symptoms I will try her on a small particle inhaler (Alvesco) at medium dose.  I would also like to refer her to Dr. Marisa Sprinkles at [facility] for a second opinion on whether or not she has asthma or VCD.  Perhaps there she can be referred to the [facility] as well as we have no good option for VCD therapy here  in [place] and [place].     Updated Medication List Outpatient Encounter Prescriptions as of 04/26/2011  Medication Sig Dispense Refill  . ALPRAZolam (XANAX) 0.25 MG tablet Take 0.25 mg by mouth. Takes half a pill prn      . aspirin 81 MG EC tablet Take 81 mg by mouth at bedtime.       Marland Kitchen atorvastatin (LIPITOR) 10 MG tablet Take 1 tablet (10 mg total) by mouth daily. Brand medically necessary.  30 tablet  11  . Cholecalciferol (VITAMIN D3) 400 UNITS CAPS Take 3 capsules by mouth daily.       Marland Kitchen dextromethorphan (DELSYM) 30 MG/5ML liquid 2 tsp every 12 hours as needed       . furosemide (LASIX) 20 MG tablet 1/2 tablet twice daily  30 tablet  6  . hydrALAZINE (APRESOLINE) 25 MG tablet Take 0.5 tablets (12.5 mg total) by mouth 3 (three) times daily.  120 tablet  11  . HYDROcodone-acetaminophen (VICODIN ES) 7.5-750 MG per tablet Take 1  tablet by mouth every 6 (six) hours as needed.        . isosorbide dinitrate (ISORDIL) 30 MG tablet Take 1/2 tablet three times a day.  45 tablet  3  . levalbuterol (XOPENEX) 1.25 MG/3ML nebulizer solution Take 1.25 mg by nebulization every 4 (four) hours as needed.  90 mL  5  . mometasone (NASONEX) 50 MCG/ACT nasal spray Place 1 spray into the nose 2 (two) times daily.       . naproxen sodium (ANAPROX) 220 MG tablet Per bottle      . omeprazole (PRILOSEC) 40 MG capsule Take 40 mg by mouth 2 (two) times daily.        . potassium chloride (K-DUR) 10 MEQ tablet Take 10 mEq by mouth 2 (two) times daily.        . VENTOLIN HFA 108 (90 BASE) MCG/ACT inhaler Take 2 puffs by mouth Every 4 hours as needed.      . zolpidem (AMBIEN) 5 MG tablet 1/2 to 1 tablet at bedtime as needed For insomnia        . DISCONTD: potassium chloride (K-DUR) 10 MEQ tablet Take 6 tablets today and tomorrow, then take 2 tablets daily.  68 tablet  3  . Alum & Mag Hydroxide-Simeth (MAGIC MOUTHWASH) SOLN Take 5 mLs by mouth 4 (four) times daily as needed.  120 mL  0  . vitamin C (ASCORBIC  ACID) 500 MG tablet Take 500 mg by mouth daily.        Marland Kitchen DISCONTD: nebivolol (BYSTOLIC) 5 MG tablet Take 1 tablet (5 mg total) by mouth daily.  30 tablet

## 2011-04-26 NOTE — Telephone Encounter (Signed)
Patient has appt with pulmonology today.

## 2011-04-26 NOTE — Assessment & Plan Note (Signed)
Likely due to a viral URI given the recent subjective fevers, chills, shortness of breath.  Meets 0/4 Centor criteria for strep throat.  Rapid flu negative.  -magic mouthwash as needed for sore throat -tylenol/naproxen as needed for pain

## 2011-04-26 NOTE — Telephone Encounter (Signed)
Patient called and stated she is wheezing but she called her cardiologist Dr. Mariah Milling and he advised her to increase her lasix and back off of liquids to see if fluid is causing her wheezing.  She states she can't wait b/c it's keeping her up at night and when she takes her medication it only lasts 2 hours then she is wheezing again.  Made her an appointment for tomorrow.  Please advise if you have any recommendations.

## 2011-04-26 NOTE — Telephone Encounter (Signed)
Agree with increase in lasix. Is she having leg swelling or coughing?  Any SOB? Is she using ventolin inhaler or xopenex neb? May take these more regularly until seen tomorrow.

## 2011-04-27 ENCOUNTER — Telehealth: Payer: Self-pay | Admitting: Pulmonary Disease

## 2011-04-27 ENCOUNTER — Ambulatory Visit: Payer: Medicare Other | Admitting: Family Medicine

## 2011-04-27 NOTE — Telephone Encounter (Signed)
Should be for [facility] MMW- Spoke with Brett Canales and notified of this. He states that pt can not tolerate "pink benadryl" and this is an ingredient in the [facility] MMW. I advised that we will just leave med off for now and I will talk with Dr Kendrick Fries and see if he wants to send in an alternative to this and if so will send in new rx. Please advise, thanks!

## 2011-04-27 NOTE — Telephone Encounter (Signed)
Per pt, the new Alvesco inhaler doesn't seem to be helping her wheezing at all. I tried to explain that she has only used the inhaler twice and she needs to continue this for several days before she may notice any real change. I also explained that Dt. McQuaid thinks her wheezing is related to VCD and not her lungs. She has been scheduled for an appt at [facility] with Dr. Thana Ates but could not get in until May 2013. Pt says her wheezing triggers her to cough and this is so severe that she almost vomits. Dr.McQuaid, pls advise.

## 2011-04-27 NOTE — Telephone Encounter (Signed)
I called the patient at home.  She notes persistent cough and wheeze.  I reassured her that based on our assessment yesterday she does not have influenza, pneumonia, or strep throat.  She asked for an antibiotic and I said no.  I explained to her that the alvesco would not help with most of her wheezing (because most of it is from VCD), however it may help if she has a component of asthma.  It would take weeks to work, however.  She said that the pharmacy can mix up MMW without "red benadryl" and not "clear benadryl" (apparently she has an intolerance to the former).  I suggest that they do this and that she try hard candies, salt water gargles and delsym.

## 2011-04-27 NOTE — Telephone Encounter (Signed)
See my other note from today 

## 2011-04-28 ENCOUNTER — Other Ambulatory Visit: Payer: Self-pay

## 2011-04-28 ENCOUNTER — Inpatient Hospital Stay (HOSPITAL_COMMUNITY)
Admission: EM | Admit: 2011-04-28 | Discharge: 2011-05-05 | DRG: 291 | Disposition: A | Discharge status: Home Health | Payer: Medicare Other | Attending: Internal Medicine | Admitting: Internal Medicine

## 2011-04-28 ENCOUNTER — Encounter (HOSPITAL_COMMUNITY): Payer: Self-pay | Admitting: *Deleted

## 2011-04-28 ENCOUNTER — Telehealth: Payer: Self-pay | Admitting: *Deleted

## 2011-04-28 ENCOUNTER — Emergency Department (HOSPITAL_COMMUNITY): Payer: Medicare Other

## 2011-04-28 DIAGNOSIS — E7800 Pure hypercholesterolemia, unspecified: Secondary | ICD-10-CM

## 2011-04-28 DIAGNOSIS — R05 Cough: Secondary | ICD-10-CM

## 2011-04-28 DIAGNOSIS — M79672 Pain in left foot: Secondary | ICD-10-CM

## 2011-04-28 DIAGNOSIS — J029 Acute pharyngitis, unspecified: Secondary | ICD-10-CM

## 2011-04-28 DIAGNOSIS — K5730 Diverticulosis of large intestine without perforation or abscess without bleeding: Secondary | ICD-10-CM

## 2011-04-28 DIAGNOSIS — E876 Hypokalemia: Secondary | ICD-10-CM | POA: Diagnosis present

## 2011-04-28 DIAGNOSIS — K589 Irritable bowel syndrome without diarrhea: Secondary | ICD-10-CM

## 2011-04-28 DIAGNOSIS — A6000 Herpesviral infection of urogenital system, unspecified: Secondary | ICD-10-CM

## 2011-04-28 DIAGNOSIS — J479 Bronchiectasis, uncomplicated: Secondary | ICD-10-CM | POA: Diagnosis present

## 2011-04-28 DIAGNOSIS — J383 Other diseases of vocal cords: Secondary | ICD-10-CM | POA: Diagnosis present

## 2011-04-28 DIAGNOSIS — R000 Tachycardia, unspecified: Secondary | ICD-10-CM | POA: Diagnosis present

## 2011-04-28 DIAGNOSIS — G43009 Migraine without aura, not intractable, without status migrainosus: Secondary | ICD-10-CM

## 2011-04-28 DIAGNOSIS — I5022 Chronic systolic (congestive) heart failure: Secondary | ICD-10-CM | POA: Diagnosis present

## 2011-04-28 DIAGNOSIS — R599 Enlarged lymph nodes, unspecified: Secondary | ICD-10-CM | POA: Diagnosis present

## 2011-04-28 DIAGNOSIS — N63 Unspecified lump in unspecified breast: Secondary | ICD-10-CM

## 2011-04-28 DIAGNOSIS — I340 Nonrheumatic mitral (valve) insufficiency: Secondary | ICD-10-CM

## 2011-04-28 DIAGNOSIS — M1990 Unspecified osteoarthritis, unspecified site: Secondary | ICD-10-CM

## 2011-04-28 DIAGNOSIS — F410 Panic disorder [episodic paroxysmal anxiety] without agoraphobia: Secondary | ICD-10-CM

## 2011-04-28 DIAGNOSIS — I5042 Chronic combined systolic (congestive) and diastolic (congestive) heart failure: Secondary | ICD-10-CM | POA: Diagnosis present

## 2011-04-28 DIAGNOSIS — I959 Hypotension, unspecified: Secondary | ICD-10-CM | POA: Diagnosis not present

## 2011-04-28 DIAGNOSIS — R059 Cough, unspecified: Secondary | ICD-10-CM

## 2011-04-28 DIAGNOSIS — I5023 Acute on chronic systolic (congestive) heart failure: Principal | ICD-10-CM | POA: Diagnosis present

## 2011-04-28 DIAGNOSIS — R0602 Shortness of breath: Secondary | ICD-10-CM | POA: Diagnosis present

## 2011-04-28 DIAGNOSIS — F411 Generalized anxiety disorder: Secondary | ICD-10-CM | POA: Diagnosis present

## 2011-04-28 DIAGNOSIS — I509 Heart failure, unspecified: Secondary | ICD-10-CM | POA: Diagnosis present

## 2011-04-28 DIAGNOSIS — R590 Localized enlarged lymph nodes: Secondary | ICD-10-CM

## 2011-04-28 DIAGNOSIS — R197 Diarrhea, unspecified: Secondary | ICD-10-CM

## 2011-04-28 DIAGNOSIS — J381 Polyp of vocal cord and larynx: Secondary | ICD-10-CM

## 2011-04-28 DIAGNOSIS — N6019 Diffuse cystic mastopathy of unspecified breast: Secondary | ICD-10-CM

## 2011-04-28 DIAGNOSIS — J209 Acute bronchitis, unspecified: Secondary | ICD-10-CM | POA: Diagnosis present

## 2011-04-28 DIAGNOSIS — R55 Syncope and collapse: Secondary | ICD-10-CM

## 2011-04-28 DIAGNOSIS — R109 Unspecified abdominal pain: Secondary | ICD-10-CM | POA: Diagnosis present

## 2011-04-28 DIAGNOSIS — R0981 Nasal congestion: Secondary | ICD-10-CM

## 2011-04-28 DIAGNOSIS — IMO0001 Reserved for inherently not codable concepts without codable children: Secondary | ICD-10-CM

## 2011-04-28 DIAGNOSIS — J9600 Acute respiratory failure, unspecified whether with hypoxia or hypercapnia: Secondary | ICD-10-CM | POA: Diagnosis present

## 2011-04-28 DIAGNOSIS — R5383 Other fatigue: Secondary | ICD-10-CM

## 2011-04-28 LAB — BLOOD GAS, ARTERIAL
Acid-Base Excess: 0.9 mmol/L (ref 0.0–2.0)
Bicarbonate: 23.9 mEq/L (ref 20.0–24.0)
Drawn by: 347861
O2 Content: 3 L/min
O2 Saturation: 97.1 %
Patient temperature: 98.6
TCO2: 21.3 mmol/L (ref 0–100)
pCO2 arterial: 34.3 mmHg — ABNORMAL LOW (ref 35.0–45.0)
pH, Arterial: 7.457 — ABNORMAL HIGH (ref 7.350–7.400)
pO2, Arterial: 85.7 mmHg (ref 80.0–100.0)

## 2011-04-28 LAB — URINALYSIS, ROUTINE W REFLEX MICROSCOPIC
Bilirubin Urine: NEGATIVE
Glucose, UA: NEGATIVE mg/dL
Hgb urine dipstick: NEGATIVE
Ketones, ur: NEGATIVE mg/dL
Leukocytes, UA: NEGATIVE
Nitrite: NEGATIVE
Protein, ur: NEGATIVE mg/dL
Specific Gravity, Urine: 1.012 (ref 1.005–1.030)
Urobilinogen, UA: 0.2 mg/dL (ref 0.0–1.0)
pH: 5.5 (ref 5.0–8.0)

## 2011-04-28 LAB — BASIC METABOLIC PANEL
BUN: 15 mg/dL (ref 6–23)
CO2: 25 mEq/L (ref 19–32)
Calcium: 10.3 mg/dL (ref 8.4–10.5)
Chloride: 102 mEq/L (ref 96–112)
Creatinine, Ser: 0.89 mg/dL (ref 0.50–1.10)
GFR calc Af Amer: 77 mL/min — ABNORMAL LOW (ref >90)
GFR calc non Af Amer: 67 mL/min — ABNORMAL LOW (ref >90)
Glucose, Bld: 140 mg/dL — ABNORMAL HIGH (ref 70–99)
Potassium: 4.2 mEq/L (ref 3.5–5.1)
Sodium: 138 mEq/L (ref 135–145)

## 2011-04-28 LAB — CBC
HCT: 35.7 % — ABNORMAL LOW (ref 36.0–46.0)
Hemoglobin: 11.6 g/dL — ABNORMAL LOW (ref 12.0–15.0)
MCH: 28.8 pg (ref 26.0–34.0)
MCHC: 32.5 g/dL (ref 30.0–36.0)
MCV: 88.6 fL (ref 78.0–100.0)
Platelets: 284 10*3/uL (ref 150–400)
RBC: 4.03 MIL/uL (ref 3.87–5.11)
RDW: 14 % (ref 11.5–15.5)
WBC: 10.1 10*3/uL (ref 4.0–10.5)

## 2011-04-28 LAB — POCT I-STAT TROPONIN I: Troponin i, poc: 0.03 ng/mL (ref 0.00–0.08)

## 2011-04-28 LAB — PRO B NATRIURETIC PEPTIDE: Pro B Natriuretic peptide (BNP): 7325 pg/mL — ABNORMAL HIGH (ref 0–125)

## 2011-04-28 MED ORDER — METHYLPREDNISOLONE SODIUM SUCC 125 MG IJ SOLR
125.0000 mg | Freq: Once | INTRAMUSCULAR | Status: AC
  Administered 2011-04-28: 125 mg via INTRAVENOUS
  Filled 2011-04-28: qty 2

## 2011-04-28 MED ORDER — SODIUM CHLORIDE 0.9 % IV BOLUS (SEPSIS)
500.0000 mL | Freq: Once | INTRAVENOUS | Status: AC
  Administered 2011-04-28: 500 mL via INTRAVENOUS

## 2011-04-28 MED ORDER — ALBUTEROL SULFATE (5 MG/ML) 0.5% IN NEBU
5.0000 mg | INHALATION_SOLUTION | Freq: Once | RESPIRATORY_TRACT | Status: AC
  Administered 2011-04-28: 5 mg via RESPIRATORY_TRACT
  Filled 2011-04-28: qty 1

## 2011-04-28 MED ORDER — HYDROCODONE-ACETAMINOPHEN 5-325 MG PO TABS
1.0000 | ORAL_TABLET | Freq: Once | ORAL | Status: AC
  Administered 2011-04-28: 0.5 via ORAL
  Filled 2011-04-28: qty 1

## 2011-04-28 MED ORDER — IPRATROPIUM BROMIDE 0.02 % IN SOLN
0.5000 mg | Freq: Once | RESPIRATORY_TRACT | Status: AC
  Administered 2011-04-28: 0.5 mg via RESPIRATORY_TRACT
  Filled 2011-04-28: qty 2.5

## 2011-04-28 MED ORDER — LORAZEPAM 1 MG PO TABS
1.0000 mg | ORAL_TABLET | Freq: Once | ORAL | Status: AC
  Administered 2011-04-28: 0.5 mg via ORAL
  Filled 2011-04-28: qty 1

## 2011-04-28 MED ORDER — IOHEXOL 300 MG/ML  SOLN
100.0000 mL | Freq: Once | INTRAMUSCULAR | Status: AC | PRN
  Administered 2011-04-28: 100 mL via INTRAVENOUS

## 2011-04-28 MED ORDER — FUROSEMIDE 10 MG/ML IJ SOLN
40.0000 mg | Freq: Once | INTRAMUSCULAR | Status: AC
  Administered 2011-04-28: 40 mg via INTRAVENOUS
  Filled 2011-04-28: qty 2

## 2011-04-28 MED ORDER — MOXIFLOXACIN HCL IN NACL 400 MG/250ML IV SOLN
400.0000 mg | Freq: Once | INTRAVENOUS | Status: AC
  Administered 2011-04-28: 400 mg via INTRAVENOUS
  Filled 2011-04-28: qty 250

## 2011-04-28 NOTE — ED Notes (Signed)
Pt states "I had pneumonia in Sept and this wheezing & SOB has been off & on, had CXR Wed, called MD this a.m. & they just told me to come here"; pt presents with SOB

## 2011-04-28 NOTE — ED Notes (Signed)
Admitting MD at bedside.

## 2011-04-28 NOTE — ED Notes (Signed)
States that she had CHF in late October.  C/o "left lower lung pain" now.

## 2011-04-28 NOTE — H&P (Signed)
PCP:   [name], MD, MD  Pulmonology Dr. Prescott [facility]   Chief Complaint:  Wheezing shortness of breath  HPI: Ashley [name] is a 65 y.o. female   has a past medical history of  Systolic CHF; Nonischemic cardiomyopathy the ejection fracture of 25% here echogram in October 2012.;Lump or mass in breast; Carpal tunnel syndrome; Migraine without aura, without mention of intractable migraine without mention of status migrainosus; Diarrhea; Diverticulosis of colon; Fibromyalgia; Pure hypercholesterolemia; Irritable bowel syndrome; Laryngeal nodule; Fibrocystic breast disease; Osteoarthritis; Genital herpes; Hemorrhoids; Anxiety; GERD (gastroesophageal reflux disease); Pneumonia; Mitral regurgitation; Anxiety; and Pleural effusion, bilateral (2012).   Presented with  Worsening wheezing and shortness of breath. Patient has had prolonged warts she states that since October she had had multiple hospitalizations full pneumonia and then had an echogram done that showed decreased ejection fraction and moderate to severe mitral regurgitation. She states that the past few weeks she hadn't gained 4 pounds. She had not had any low extremity swelling. Will be seen has worsened and now she has trouble catching her breath. She endorses orthopnea. In the past she did admit to waking up at night with episodes of loss of breath.   Review of Systems:    Pertinent Positives: She states that everything hurts she has pain in her chest "my heart problems" her left arm bothers her, she has chronic abdominal pain, she has had diarrhea, sometimes she has constipation, she endorses chills, she stated that she had maybe a low-grade fever up to 100. She endorses shortness of breath and wheezing his persistent as well as cough. The symptoms are continues  Otherwise ROS are negative except for above, 10 systems were reviewed  Past Medical History: Past Medical History  Diagnosis Date  . Lump or mass in  breast   . Carpal tunnel syndrome   . Migraine without aura, without mention of intractable migraine without mention of status migrainosus   . Diarrhea   . Diverticulosis of colon     no diverticulitis  . Fibromyalgia   . Pure hypercholesterolemia   . Irritable bowel syndrome   . Laryngeal nodule     Dr. Thelma Comp  . Fibrocystic breast disease   . Osteoarthritis   . Genital herpes   . Hemorrhoids   . Anxiety   . GERD (gastroesophageal reflux disease)   . Systolic CHF     EF 25-35%, LV dilation, significant MR  . Nonischemic cardiomyopathy   . Pneumonia   . Mitral regurgitation   . Anxiety   . Pleural effusion, bilateral 2012   Past Surgical History  Procedure Date  . Appendectomy   . Mandible surgery   . Knee arthroscopy w/ orif     Left  . Osteotomy     Left foot  . 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 Total abdominal hysterectomy w/ bilateral salpingoophorectomy 1987    Endometriosis  . Dexa 03/28/99    osteopenia L/S  . Colonoscopy 1998    nml (sigmoid-Gilbert-nol)  . Head mri, head ct 10/1998    ? H/A focus ([facility])  . Emg/mcv 10/16/01    + mild carpal tunnel  . Dexa 03/12/03    2.2 Spine 0.1 Hip  Osteopenia  . Mri right hip 12/12/01    Tendonitis Gluteus Medius to Gtr Trochanter Neg Bursitis (Dr. Montez Morita)  . L/s films 11/21/01    nml; Right hip nml  . Mri u/s 11/21/01    min. DDD L/3-4  .  Colonoscopy 01/04/04    polyps, diverticulosis/severe  . Dexa 11/14/05    1.9 Spine 0.3 Hip  slight improvement  . Flex laryngoscopy 06/11/06    (Juengel) nml  . Dexa 11/22/06    improved osteo and fem neck  . [facility] bunionectomy 11/04/08    Right Great Toe (Dr. Lestine Box)  . Breast mass excision 01/2010    Left-fibrocystic change w/intraductal papilloma, no malignancy  . Ct chest     12/2010 - possible bronchiectasis lower lobes, 02/2011 - enlarged bilateral effusions, bibasilar atx  . Stress myoview 02/2011    no significant ischemia     Medications: Prior to Admission  medications   Medication Sig Start Date End Date Taking? Authorizing Provider  ALPRAZolam (XANAX) 0.25 MG tablet Take 0.25 mg by mouth. Takes half a pill prn   Yes Historical Provider, MD  Alum & Mag Hydroxide-Simeth (MAGIC MOUTHWASH) SOLN Take 5 mLs by mouth 4 (four) times daily as needed. 04/26/11  Yes Max Fickle, MD  aspirin 81 MG EC tablet Take 81 mg by mouth at bedtime.    Yes Historical Provider, MD  atorvastatin (LIPITOR) 10 MG tablet Take 1 tablet (10 mg total) by mouth daily. Brand medically necessary. 08/18/10  Yes [name], MD  calcium carbonate (TUMS - DOSED IN MG ELEMENTAL CALCIUM) 500 MG chewable tablet Chew 1 tablet by mouth daily. heartburn    Yes Historical Provider, MD  Cholecalciferol (VITAMIN D3) 400 UNITS CAPS Take 3 capsules by mouth daily.    Yes Historical Provider, MD  ciclesonide (ALVESCO) 160 MCG/ACT inhaler Inhale 1 puff into the lungs 2 (two) times daily.     Yes Historical Provider, MD  dextromethorphan (DELSYM) 30 MG/5ML liquid 2 tsp every 12 hours as needed    Yes Historical Provider, MD  furosemide (LASIX) 20 MG tablet Take 10 mg by mouth daily. 1/2 tablet twice daily 04/12/11  Yes Antonieta Iba, MD  hydrALAZINE (APRESOLINE) 25 MG tablet Take 12.5 mg by mouth 3 (three) times daily.   04/03/11 04/02/12 Yes Max Fickle, MD  HYDROcodone-acetaminophen (VICODIN ES) 7.5-750 MG per tablet Take 1 tablet by mouth every 6 (six) hours as needed. pain   Yes Historical Provider, MD  isosorbide dinitrate (ISORDIL) 30 MG tablet Take 1/2 tablet three times a day. 04/17/11  Yes Antonieta Iba, MD  levalbuterol (XOPENEX) 1.25 MG/3ML nebulizer solution Take 1.25 mg by nebulization every 4 (four) hours as needed. 03/27/11  Yes Tammy Parrett, NP  mometasone (NASONEX) 50 MCG/ACT nasal spray Place 1 spray into the nose 2 (two) times daily.    Yes Historical Provider, MD  naproxen sodium (ANAPROX) 220 MG tablet Per bottle   Yes Historical Provider, MD  omeprazole  (PRILOSEC) 40 MG capsule Take 40 mg by mouth 2 (two) times daily.     Yes Historical Provider, MD  potassium chloride (K-DUR) 10 MEQ tablet Take 10 mEq by mouth 2 (two) times daily.   04/12/11  Yes Antonieta Iba, MD  VENTOLIN HFA 108 (90 BASE) MCG/ACT inhaler Take 2 puffs by mouth Every 4 hours as needed. 03/01/11  Yes Historical Provider, MD  vitamin C (ASCORBIC ACID) 500 MG tablet Take 500 mg by mouth daily.     Yes Historical Provider, MD  zolpidem (AMBIEN) 5 MG tablet 1/2 to 1 tablet at bedtime as needed For insomnia   01/20/11  Yes [name], MD    Allergies:   Allergies  Allergen Reactions  . Antihistamines, Chlorpheniramine-Type Other (See Comments)  Makes her eyes look like she sees strobe lights  . Atorvastatin     REACTION: muscle cramps. Pt states she's taken this now  . Influenza Vac Split (Flu Virus Vaccine) Other (See Comments)    Body aches  . Latex   . Milk-Related Compounds     REACTION: coughing, but does drink.  . Penicillins     REACTION: immune to it, able to take amoxicillin  . Promethazine Hcl     REACTION: Dizzy, headache  . Rosuvastatin     REACTION: not effective  . Sulfasalazine     Social History:  reports that she has never smoked. She has never used smokeless tobacco. She reports that she does not drink alcohol or use illicit drugs.   Family History: family history includes Alcohol abuse in her paternal grandfather; Arthritis in her mother; Dementia in her mother; Emphysema in her mother; Heart disease in some unspecified family members; Hypertension in her maternal grandfather; Lung cancer in her father; Osteoporosis in her mother; and Stroke in her mother and paternal grandfather.    Physical Exam: Patient Vitals for the past 24 hrs:  BP Temp Temp src Pulse Resp SpO2 Weight  04/28/11 1519 160/77 mmHg 99.8 F (37.7 C) Oral 140  - 92 % -  04/28/11 1416 - - - - - 93 % -  04/28/11 1247 143/75 mmHg - - 126  - 93 % -  04/28/11 1226  126/84 mmHg 100.8 F (38.2 C) Oral 127  21  95 % 71.668 kg (158 lb)     Alert and Oriented in No Acute distress, she does pause as she is speaking to catch her breath Mucous Membranes and Skin: Moist mucous membranes Head Non traumatic, neck supple Heart: Rapid but regular difficult to detect murmur Lungs: Somewhat distant I do not appreciate any wheezes this crackles at the bases bilaterally Abdomen: Soft nontender nondistended Lower extremities: No clubbing cyanosis or edema Neurologically Grossly intact Skin clean Dry and intact no rash body mass index is unknown because there is no height on file.   Labs on Admission:   [facility] 04/28/11 1345  NA 138  K 4.2  CL 102  CO2 25  GLUCOSE 140*  BUN 15  CREATININE 0.89  CALCIUM 10.3  MG --  PHOS --   No results found for this basename: AST:2,ALT:2,ALKPHOS:2,BILITOT:2,PROT:2,ALBUMIN:2 in the last 72 hours No results found for this basename: LIPASE:2,AMYLASE:2 in the last 72 hours  Basename 04/28/11 1345  WBC 10.1  NEUTROABS --  HGB 11.6*  HCT 35.7*  MCV 88.6  PLT 284  BNP 7325 Troponin i, poc 0.03   No results found for this basename: CKTOTAL:3,CKMB:3,CKMBINDEX:3,TROPONINI:3 in the last 72 hours No results found for this basename: TSH,T4TOTAL,FREET3,T3FREE,THYROIDAB in the last 72 hours No results found for this basename: VITAMINB12:2,FOLATE:2,FERRITIN:2,TIBC:2,IRON:2,RETICCTPCT:2 in the last 72 hours No results found for this basename: HGBA1C    The CrCl is unknown because both a height and weight (above a minimum accepted value) are required for this calculation. ABG No results found for this basename: phart, pco2, po2, hco3, tco2, acidbasedef, o2sat     No results found for this basename: DDIMER     Other results: I have personally reviewed EKG  ECG REPORT  Rate: 124  Rhythm: Sinus tachycardia ST&T Change: Early repolarization changes nonspecific  UA not obtained    Blood Culture No results found  for this basename: sdes, specrequest, cult, reptstatus       Radiological Exams on Admission: Ct  Angio Chest W/cm &/or Wo Cm  04/28/2011  *RADIOLOGY REPORT*  Clinical Data:  Short of breath  CT ANGIOGRAPHY CHEST WITH CONTRAST  Technique:  Multidetector CT imaging of the chest was performed using the standard protocol during bolus administration of intravenous contrast.  Multiplanar CT image reconstructions including MIPs were obtained to evaluate the vascular anatomy.  Contrast: OMNIPAQUE IOHEXOL 300 MG/ML IV SOLN  Comparison:  None  Findings:  There are no filling defects in the pulmonary arterial tree to suggest acute pulmonary thromboembolism.  2.7 x 1.3 cm right hilar lymph node mass.  Borderline enlarged mediastinal adenopathy is scattered.  12 mm precarinal node on image 32.  Small prevascular nodes.  7 mm node at the thoracic inlet on the right on image 9.  Small bilateral pleural effusions  Dependent atelectasis bilaterally. Mild diffuse bronchiectasis and interlobular septal thickening.  This is nonspecific.  Review of the MIP images confirms the above findings.  IMPRESSION: No evidence of acute pulmonary thromboembolism.  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  Small bilateral pleural effusions.  Abnormal right hilar and mediastinal adenopathy.  Inflammatory and neoplastic etiology are considerations.  At the minimum, 3- 6 months follow-up is recommended to ensure resolution.  Original Report Authenticated By: Donavan Burnet, M.D.    Assessment/Plan  Present on Admission:  .Systolic CHF - possible CHF exacerbation, given shortness of breath and tachycardia will monitor and step down.  She also endorses some chest pain which sounds like it's been ongoing but will cycle cardiac markers and repeat EKG in a.m. Will give IV Lasix monitor her weight and creatinine. She would probably benefit from cardiology consult as she does have true  cardiomyopathy and valvular dysfunction  .Anxiety attack - she has history of anxiety we'll make sure she has Xanax as needed  .Shortness of breath - likely multifactorial with combination of an anxiety as well as a heart failure as well as possible lung disease. She has been followed by pulmonology for quite some time I would appreciate their input in this in the morning. .Tachycardia - has been going on for number of days will check TSH start on bisoprolol .ABDOMINAL PAIN - this is chronic, does not seem to worsen her baseline   Prophylaxis: protonix, lovenox  CODE STATUS: Full Code   Lyndie Vanderloop 04/28/2011, 5:38 PM

## 2011-04-28 NOTE — Telephone Encounter (Signed)
Pt called, very SOB, unable to complete sentence; c/o wheezing all night and worsening this AM. She has taken all inhalers prescribed, no change. Pt wanted to know what hosp [facility] is affiliated with so she can go to that ER; advised she go to [facility] ER with exacerbated symptoms. Pt's husband is taking her now.

## 2011-04-28 NOTE — ED Notes (Signed)
Pt states that she has a hx of pna.  Having SOB.  Was told to come to the ER today.  Hx of vocal chord dysfunction but was told that the VCD would not cause wheezing.  Pt is having "sighing" respirations.  C/o wheezing however clear breath sounds noted upon assessment.  C/o lt arm pain.

## 2011-04-28 NOTE — ED Provider Notes (Signed)
History     CSN: [id number] Arrival date & time: 04/28/2011 11:59 AM   First MD Initiated Contact with Patient 04/28/11 1245      Chief Complaint  Patient presents with  . Shortness of Breath    (Consider location/radiation/quality/duration/timing/severity/associated sxs/prior treatment) HPI Patient presents with complaint of shortness of breath. She states that she's had wheezing and shortness of breath over the past several months which she feels are worse last night and today. She is seeing her primary doctor multiple times for these similar symptoms. She states 2 days ago and also per chart review she had chest x-ray influenza testing which were both normal. She states that last night she used her inhalers and nebulizer without significant relief. Her symptoms did seem to improve after taking her Xanax pill. This morning she felt weak and fatigued and felt that shortness of breath was worse. She denies any chest pain. She denies having had any fevers or chills. She has been advised by her primary doctor that he does not think her symptoms are related to asthma or COPD but rather vocal cord dysfunction. She also takes Lasix but is only on a dose of 10 mg daily. She has not had any increased leg swelling.  Past Medical History  Diagnosis Date  . Lump or mass in breast   . Carpal tunnel syndrome   . Migraine without aura, without mention of intractable migraine without mention of status migrainosus   . Diarrhea   . Diverticulosis of colon     no diverticulitis  . Fibromyalgia   . Pure hypercholesterolemia   . Irritable bowel syndrome   . Laryngeal nodule     Dr. Thelma Comp  . Fibrocystic breast disease   . Osteoarthritis   . Genital herpes   . Hemorrhoids   . Anxiety   . GERD (gastroesophageal reflux disease)   . Systolic CHF     EF 25-35%, LV dilation, significant MR  . Nonischemic cardiomyopathy   . Pneumonia   . Mitral regurgitation   . Anxiety   . Pleural effusion,  bilateral 2012    Past Surgical History  Procedure Date  . Appendectomy   . Mandible surgery   . Knee arthroscopy w/ orif     Left  . Osteotomy     Left foot  . 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 Total abdominal hysterectomy w/ bilateral salpingoophorectomy 1987    Endometriosis  . Dexa 03/28/99    osteopenia L/S  . Colonoscopy 1998    nml (sigmoid-Gilbert-nol)  . Head mri, head ct 10/1998    ? H/A focus ([facility])  . Emg/mcv 10/16/01    + mild carpal tunnel  . Dexa 03/12/03    2.2 Spine 0.1 Hip  Osteopenia  . Mri right hip 12/12/01    Tendonitis Gluteus Medius to Gtr Trochanter Neg Bursitis (Dr. Montez Morita)  . L/s films 11/21/01    nml; Right hip nml  . Mri u/s 11/21/01    min. DDD L/3-4  . Colonoscopy 01/04/04    polyps, diverticulosis/severe  . Dexa 11/14/05    1.9 Spine 0.3 Hip  slight improvement  . Flex laryngoscopy 06/11/06    (Juengel) nml  . Dexa 11/22/06    improved osteo and fem neck  . [facility] bunionectomy 11/04/08    Right Great Toe (Dr. Lestine Box)  . Breast mass excision 01/2010    Left-fibrocystic change w/intraductal papilloma, no malignancy  . Ct chest  12/2010 - possible bronchiectasis lower lobes, 02/2011 - enlarged bilateral effusions, bibasilar atx  . Stress myoview 02/2011    no significant ischemia    Family History  Problem Relation Age of Onset  . Lung cancer Father     + smoker  . Dementia Mother   . Osteoporosis Mother     Lumbar spine  . Stroke Mother     x 5 @ 36 YOA  . Arthritis Mother     hands  . Emphysema Mother   . Alcohol abuse Paternal Grandfather   . Hypertension Maternal Grandfather     ?  . Stroke Paternal Grandfather     ?  Marland Kitchen Heart disease      grandmother  . Heart disease      grandfather    History  Substance Use Topics  . Smoking status: Never Smoker   . Smokeless tobacco: Never Used   Comment: Lived with smokers x 23 yrs, smoked herself "for a week"  . Alcohol Use: No    OB History    Grav Para Term Preterm  Abortions TAB SAB Ect Mult Living                  Review of Systems ROS reviewed and otherwise negative except for mentioned in HPI  Allergies  Antihistamines, chlorpheniramine-type; Atorvastatin; Influenza vac split; Latex; Milk-related compounds; Penicillins; Promethazine hcl; Rosuvastatin; and Sulfasalazine  Home Medications   Current Outpatient Rx  Name Route Sig Dispense Refill  . ALPRAZOLAM 0.25 MG PO TABS Oral Take 0.25 mg by mouth. Takes half a pill prn    . MAGIC MOUTHWASH Oral Take 5 mLs by mouth 4 (four) times daily as needed. 120 mL 0  . ASPIRIN 81 MG PO TBEC Oral Take 81 mg by mouth at bedtime.     . ATORVASTATIN CALCIUM 10 MG PO TABS Oral Take 1 tablet (10 mg total) by mouth daily. Brand medically necessary. 30 tablet 11    BMN  . CALCIUM CARBONATE ANTACID 500 MG PO CHEW Oral Chew 1 tablet by mouth daily. heartburn     . VITAMIN D3 400 UNITS PO CAPS Oral Take 3 capsules by mouth daily.     Marland Kitchen CICLESONIDE 160 MCG/ACT IN AERS Inhalation Inhale 1 puff into the lungs 2 (two) times daily.      Marland Kitchen DEXTROMETHORPHAN POLISTIREX ER 30 MG/5ML PO LQCR  2 tsp every 12 hours as needed     . FUROSEMIDE 20 MG PO TABS Oral Take 10 mg by mouth daily. 1/2 tablet twice daily 30 tablet 6  . HYDRALAZINE HCL 25 MG PO TABS Oral Take 12.5 mg by mouth 3 (three) times daily.      Marland Kitchen HYDROCODONE-ACETAMINOPHEN 7.5-750 MG PO TABS Oral Take 1 tablet by mouth every 6 (six) hours as needed. pain    . ISOSORBIDE DINITRATE 30 MG PO TABS  Take 1/2 tablet three times a day. 45 tablet 3  . LEVALBUTEROL HCL 1.25 MG/3ML IN NEBU Nebulization Take 1.25 mg by nebulization every 4 (four) hours as needed. 90 mL 5  . MOMETASONE FUROATE 50 MCG/ACT NA SUSP Nasal Place 1 spray into the nose 2 (two) times daily.     Marland Kitchen NAPROXEN SODIUM 220 MG PO TABS  Per bottle    . OMEPRAZOLE 40 MG PO CPDR Oral Take 40 mg by mouth 2 (two) times daily.      Marland Kitchen POTASSIUM CHLORIDE CR 10 MEQ PO TBCR Oral Take 10 mEq by mouth 2 (  two) times  daily.      . VENTOLIN HFA 108 (90 BASE) MCG/ACT IN AERS Oral Take 2 puffs by mouth Every 4 hours as needed.    Marland Kitchen VITAMIN C 500 MG PO TABS Oral Take 500 mg by mouth daily.      Marland Kitchen ZOLPIDEM TARTRATE 5 MG PO TABS  1/2 to 1 tablet at bedtime as needed For insomnia        BP 160/77  Pulse 140  Temp(Src) 99.8 F (37.7 C) (Oral)  Resp 21  Wt 158 lb (71.668 kg)  SpO2 92% Vitals reviewed Physical Exam Physical Examination: General appearance - alert and in mild respiratory distress Mental status - alert, oriented to person, place, and time, anxious Mouth - mucous membranes moist, pharynx normal without lesions Chest - tachypneic, speaking in 2-3 word sentences, no wheezing, breath sounds symmetric but decreased bilaterally Heart - tachycardic, regular, normal s1/s2, no murmurs gallops or rubs Abdomen - soft, nontender, nondistended, no masses or organomegaly Musculoskeletal - no joint tenderness, deformity or swelling Extremities - peripheral pulses normal, no pedal edema, no cyanosis Skin - normal coloration and turgor, no rashes  ED Course  Procedures (including critical care time)  Date: 04/28/2011  Rate: 124  Rhythm: sinus tachycardia  QRS Axis: normal  Intervals: normal  ST/T Wave abnormalities: nonspecific ST/T wave abnormalities c/w changes of LVH  Conduction Disutrbances:none except one PVC  Narrative Interpretation:   Old EKG Reviewed: unchanged except for tachycardia    Labs Reviewed  CBC - Abnormal; Notable for the following:    Hemoglobin 11.6 (*)    HCT 35.7 (*)    All other components within normal limits  BASIC METABOLIC PANEL - Abnormal; Notable for the following:    Glucose, Bld 140 (*)    GFR calc non Af Amer 67 (*)    GFR calc Af Amer 77 (*)    All other components within normal limits  PRO B NATRIURETIC PEPTIDE - Abnormal; Notable for the following:    Pro B Natriuretic peptide (BNP) 7325.0 (*)    All other components within normal limits  POCT I-STAT  TROPONIN I  I-STAT TROPONIN I   Ct Angio Chest W/cm &/or Wo Cm  04/28/2011  *RADIOLOGY REPORT*  Clinical Data:  Short of breath  CT ANGIOGRAPHY CHEST WITH CONTRAST  Technique:  Multidetector CT imaging of the chest was performed using the standard protocol during bolus administration of intravenous contrast.  Multiplanar CT image reconstructions including MIPs were obtained to evaluate the vascular anatomy.  Contrast: OMNIPAQUE IOHEXOL 300 MG/ML IV SOLN  Comparison:  None  Findings:  There are no filling defects in the pulmonary arterial tree to suggest acute pulmonary thromboembolism.  2.7 x 1.3 cm right hilar lymph node mass.  Borderline enlarged mediastinal adenopathy is scattered.  12 mm precarinal node on image 32.  Small prevascular nodes.  7 mm node at the thoracic inlet on the right on image 9.  Small bilateral pleural effusions  Dependent atelectasis bilaterally. Mild diffuse bronchiectasis and interlobular septal thickening.  This is nonspecific.  Review of the MIP images confirms the above findings.  IMPRESSION: No evidence of acute pulmonary thromboembolism.  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  Small bilateral pleural effusions.  Abnormal right hilar and mediastinal adenopathy.  Inflammatory and neoplastic etiology are considerations.  At the minimum, 3- 6 months follow-up is recommended to ensure resolution.  Original Report Authenticated By: Donavan Burnet, M.D.  1. Shortness of breath   2. Tachycardia       MDM  Patient presenting with several weeks of shortness of breath and perceived wheezing. She's been seen multiple times by her primary doctor and had a chest x-ray as well as rapid strep and influenza testing 2 days ago all of which were negative. Today she feels that her shortness of breath worsened and she felt more fatigued than her usual. Workup today in the ED reveals CT scan with no pulmonary embolism and no  frank pneumonia however the appearance of an inflammatory interstitial process. Patient has been given albuterol and Atrovent as well as antibiotics. She continues to be tachycardic and feels short of breath. Her BNP is also elevated and she will be given Lasix as well.        Ethelda Chick, MD 04/28/11 [id number]

## 2011-04-28 NOTE — ED Notes (Signed)
Bedside commode brought to pt to void into.  Husband is outside door.

## 2011-04-29 ENCOUNTER — Other Ambulatory Visit: Payer: Self-pay

## 2011-04-29 LAB — CBC
HCT: 40.2 % (ref 36.0–46.0)
Hemoglobin: 13.4 g/dL (ref 12.0–15.0)
MCH: 29.2 pg (ref 26.0–34.0)
MCHC: 33.3 g/dL (ref 30.0–36.0)
MCV: 87.6 fL (ref 78.0–100.0)
Platelets: 318 10*3/uL (ref 150–400)
RBC: 4.59 MIL/uL (ref 3.87–5.11)
RDW: 13.9 % (ref 11.5–15.5)
WBC: 19.9 10*3/uL — ABNORMAL HIGH (ref 4.0–10.5)

## 2011-04-29 LAB — CREATININE, SERUM
Creatinine, Ser: 0.94 mg/dL (ref 0.50–1.10)
GFR calc Af Amer: 72 mL/min — ABNORMAL LOW (ref >90)
GFR calc non Af Amer: 62 mL/min — ABNORMAL LOW (ref >90)

## 2011-04-29 LAB — MRSA PCR SCREENING: MRSA by PCR: NEGATIVE

## 2011-04-29 LAB — CARDIAC PANEL(CRET KIN+CKTOT+MB+TROPI)
CK, MB: 3.6 ng/mL (ref 0.3–4.0)
Relative Index: 2.6 — ABNORMAL HIGH (ref 0.0–2.5)
Total CK: 139 U/L (ref 7–177)
Troponin I: <0.3 ng/mL (ref <0.30)

## 2011-04-29 LAB — TSH: TSH: 0.245 u[IU]/mL — ABNORMAL LOW (ref 0.350–4.500)

## 2011-04-29 LAB — MAGNESIUM: Magnesium: 2.3 mg/dL (ref 1.5–2.5)

## 2011-04-29 MED ORDER — DEXTROMETHORPHAN POLISTIREX 30 MG/5ML PO LQCR
30.0000 mg | Freq: Every evening | ORAL | Status: DC | PRN
  Administered 2011-04-29 – 2011-04-30 (×2): 30 mg via ORAL
  Filled 2011-04-29 (×2): qty 5

## 2011-04-29 MED ORDER — ACETAMINOPHEN 325 MG PO TABS: 650.0000 mg | ORAL_TABLET | ORAL | Status: DC | PRN

## 2011-04-29 MED ORDER — POTASSIUM CHLORIDE 10 MEQ PO TBCR
10.0000 meq | EXTENDED_RELEASE_TABLET | Freq: Two times a day (BID) | ORAL | Status: DC
  Administered 2011-04-29 – 2011-05-02 (×6): 10 meq via ORAL
  Filled 2011-04-29 (×5): qty 1

## 2011-04-29 MED ORDER — SIMVASTATIN 20 MG PO TABS
20.0000 mg | ORAL_TABLET | Freq: Every day | ORAL | Status: DC
  Filled 2011-04-29 (×2): qty 1

## 2011-04-29 MED ORDER — CICLESONIDE 160 MCG/ACT IN AERS: 1.0000 | INHALATION_SPRAY | Freq: Two times a day (BID) | RESPIRATORY_TRACT | Status: DC

## 2011-04-29 MED ORDER — ENALAPRIL MALEATE 2.5 MG PO TABS
2.5000 mg | ORAL_TABLET | Freq: Two times a day (BID) | ORAL | Status: DC
  Filled 2011-04-29 (×3): qty 1

## 2011-04-29 MED ORDER — ENOXAPARIN SODIUM 40 MG/0.4ML ~~LOC~~ SOLN
40.0000 mg | SUBCUTANEOUS | Status: DC
  Administered 2011-04-29 – 2011-05-05 (×7): 40 mg via SUBCUTANEOUS
  Filled 2011-04-29 (×8): qty 0.4

## 2011-04-29 MED ORDER — HYDROCODONE-ACETAMINOPHEN 7.5-325 MG PO TABS
1.0000 | ORAL_TABLET | Freq: Four times a day (QID) | ORAL | Status: DC | PRN
  Administered 2011-04-29 – 2011-04-30 (×3): 1 via ORAL
  Filled 2011-04-29 (×3): qty 1

## 2011-04-29 MED ORDER — PANTOPRAZOLE SODIUM 40 MG PO TBEC
40.0000 mg | DELAYED_RELEASE_TABLET | Freq: Every day | ORAL | Status: DC
  Administered 2011-04-29 (×2): 40 mg via ORAL
  Filled 2011-04-29 (×4): qty 1

## 2011-04-29 MED ORDER — SODIUM CHLORIDE 0.9 % IJ SOLN
3.0000 mL | Freq: Two times a day (BID) | INTRAMUSCULAR | Status: DC
  Administered 2011-04-30 – 2011-05-01 (×2): 3 mL via INTRAVENOUS

## 2011-04-29 MED ORDER — MAGIC MOUTHWASH
5.0000 mL | Freq: Four times a day (QID) | ORAL | Status: DC | PRN
  Administered 2011-04-29 (×2): 5 mL via ORAL
  Filled 2011-04-29 (×3): qty 5

## 2011-04-29 MED ORDER — ISOSORBIDE DINITRATE 5 MG PO TABS
15.0000 mg | ORAL_TABLET | Freq: Three times a day (TID) | ORAL | Status: DC
  Administered 2011-04-29 (×2): 15 mg via ORAL
  Filled 2011-04-29 (×6): qty 1

## 2011-04-29 MED ORDER — BISOPROLOL FUMARATE 5 MG PO TABS
5.0000 mg | ORAL_TABLET | Freq: Every day | ORAL | Status: DC
  Administered 2011-04-30 – 2011-05-05 (×6): 5 mg via ORAL
  Filled 2011-04-29 (×8): qty 1

## 2011-04-29 MED ORDER — SODIUM CHLORIDE 0.9 % IV SOLN: 250.0000 mL | INTRAVENOUS | Status: DC | PRN

## 2011-04-29 MED ORDER — ONDANSETRON HCL 4 MG/2ML IJ SOLN
4.0000 mg | Freq: Four times a day (QID) | INTRAMUSCULAR | Status: DC | PRN
  Administered 2011-04-30: 4 mg via INTRAVENOUS
  Filled 2011-04-29: qty 2

## 2011-04-29 MED ORDER — PANTOPRAZOLE SODIUM 40 MG PO TBEC
40.0000 mg | DELAYED_RELEASE_TABLET | Freq: Every day | ORAL | Status: DC
  Filled 2011-04-29: qty 1

## 2011-04-29 MED ORDER — ZOLPIDEM TARTRATE 5 MG PO TABS
5.0000 mg | ORAL_TABLET | Freq: Every evening | ORAL | Status: DC | PRN
  Administered 2011-04-29: 5 mg via ORAL
  Filled 2011-04-29: qty 1

## 2011-04-29 MED ORDER — LEVALBUTEROL HCL 1.25 MG/0.5ML IN NEBU
1.2500 mg | INHALATION_SOLUTION | RESPIRATORY_TRACT | Status: DC | PRN
  Administered 2011-04-29 – 2011-05-04 (×5): 1.25 mg via RESPIRATORY_TRACT
  Filled 2011-04-29 (×4): qty 0.5

## 2011-04-29 MED ORDER — FLUTICASONE PROPIONATE HFA 110 MCG/ACT IN AERO
1.0000 | INHALATION_SPRAY | Freq: Two times a day (BID) | RESPIRATORY_TRACT | Status: DC
  Filled 2011-04-29: qty 12

## 2011-04-29 MED ORDER — FLUTICASONE PROPIONATE 50 MCG/ACT NA SUSP
1.0000 | Freq: Every day | NASAL | Status: DC
  Filled 2011-04-29: qty 16

## 2011-04-29 MED ORDER — CALCIUM CARBONATE ANTACID 500 MG PO CHEW
1.0000 | CHEWABLE_TABLET | Freq: Every day | ORAL | Status: DC
  Administered 2011-04-29 – 2011-05-05 (×6): 200 mg via ORAL
  Filled 2011-04-29 (×8): qty 1

## 2011-04-29 MED ORDER — FUROSEMIDE 10 MG/ML IJ SOLN
20.0000 mg | Freq: Two times a day (BID) | INTRAMUSCULAR | Status: DC
  Administered 2011-04-29 – 2011-04-30 (×3): 20 mg via INTRAVENOUS
  Filled 2011-04-29 (×7): qty 2

## 2011-04-29 MED ORDER — SODIUM CHLORIDE 0.9 % IJ SOLN: 3.0000 mL | INTRAMUSCULAR | Status: DC | PRN

## 2011-04-29 MED ORDER — ASPIRIN EC 81 MG PO TBEC
81.0000 mg | DELAYED_RELEASE_TABLET | Freq: Every day | ORAL | Status: DC
  Administered 2011-04-29 – 2011-05-04 (×5): 81 mg via ORAL
  Filled 2011-04-29 (×8): qty 1

## 2011-04-29 MED ORDER — ALPRAZOLAM 0.25 MG PO TABS
0.2500 mg | ORAL_TABLET | Freq: Three times a day (TID) | ORAL | Status: DC | PRN
  Administered 2011-04-29: 0.25 mg via ORAL
  Administered 2011-05-01: 0.125 mg via ORAL
  Administered 2011-05-01 – 2011-05-04 (×8): 0.25 mg via ORAL
  Filled 2011-04-29 (×6): qty 1

## 2011-04-29 MED ORDER — LISINOPRIL 10 MG PO TABS
10.0000 mg | ORAL_TABLET | Freq: Every day | ORAL | Status: DC
  Administered 2011-04-29 – 2011-04-30 (×2): 10 mg via ORAL
  Filled 2011-04-29 (×2): qty 1

## 2011-04-29 MED ORDER — HYDRALAZINE HCL 25 MG PO TABS
12.5000 mg | ORAL_TABLET | Freq: Three times a day (TID) | ORAL | Status: DC
  Administered 2011-04-29: 22:00:00 via ORAL
  Administered 2011-04-29 – 2011-05-05 (×13): 12.5 mg via ORAL
  Filled 2011-04-29 (×8): qty 0.5

## 2011-04-29 NOTE — ED Notes (Signed)
Patient resting comfortably with eyes closed. Tachycardia noted on cardiac monitor with occasional unifocal PVCs noted.

## 2011-04-29 NOTE — ED Notes (Signed)
Updated patient to let her know that medications had been ordered from pharmacy.

## 2011-04-29 NOTE — ED Notes (Signed)
Medications counted and sent to pharmacy 

## 2011-04-29 NOTE — ED Notes (Signed)
Report called to erin, rn in step down.

## 2011-04-29 NOTE — ED Notes (Signed)
Patient requesting "antacid medicine". Informed patient that Protonix had been ordered. Patient requests that this medication be given before she receives breakfast. Message sent to pharmacy.

## 2011-04-29 NOTE — Progress Notes (Signed)
Subjective: Patient states that since September she has been unable to sleep in the flat position. She's been sitting up in a chair and sleeping in a semi-reclined position. The patient states that she also has wheezing associated with his sleeping at night. She attributed this to a pneumonia that she had September and furthermore she after discussion with her primary care physician felt that she had some vocal cord issues contributing to her wheezin. I reviewed the patient's medical records and Dr. Sherryle Lis her cardiologist back on December 2 recommended the patient should have lisinopril 10 mg. The patient states however that she decided not to take the lisinopril because she's been having cough and she discussed it with her primary care physician he informed her that the medication could possibly cause her to have further coughing so she made independent decision not to take it anymore. The patient is well aware of the fact that her ejection fraction is 25%. Please note that since coming to the emergency left-sided patient has a diuresis. The patient is able to a layer much more reclined position without difficulty breathing there is no wheezing. I had a long discussion with the patient and her husband to the extent that wheezing does not on the main indication was persistent it can certainly be associated with an exacerbation of CHF and again the patient's response to diuresis this is likely the symptoms of CHF that she's been experiencing. The patient also endorses that she's had some pedal edema although she states it has not been impressive. She also states that she's been having resting tachycardia which she feels has been getting worse. This is been documented she states that her cardiologist and her primary care physician's office. Objective: Filed Vitals:   04/29/11 0416 04/29/11 0604 04/29/11 0657 04/29/11 0816  BP: 121/70  125/77   Pulse: 104  110 120  Temp: 97.9 F (36.6 C)   98.2 F (36.8 C)    TempSrc: Oral   Oral  Resp: 20  24 29   Weight:  69.911 kg (154 lb 2 oz)    SpO2: 100%  98% 100%   Weight change:   Intake/Output Summary (Last 24 hours) at 04/29/11 1017 Last data filed at 04/29/11 0924  Gross per 24 hour  Intake   1240 ml  Output   1451 ml  Net   -211 ml    General: Alert, awake, oriented x3, in no acute distress.  HEENT: [place]/AT PEERL, EOMI Neck: Trachea midline,  no masses, no thyromegal,y no JVD, no carotid bruit OROPHARYNX:  Moist, No exudate/ erythema/lesions.  Heart: Regular rate and rhythm, without murmurs, rubs, gallops, PMI non-displaced, no heaves or thrills on palpation.  Lungs: Clear to auscultation, no wheezing but does have some mild bibasilar rhonchi rhonchi noted. No increased vocal fremitus resonant to percussion  Abdomen: Soft, nontender, nondistended, positive bowel sounds, no masses no hepatosplenomegaly noted..  Neuro: No focal neurological deficits noted cranial nerves II through XII grossly intact. DTRs 2+ bilaterally upper and lower extremities. Strength 5 out of 5 in bilateral upper and lower extremities. Musculoskeletal: No warm swelling or erythema around joints, no spinal tenderness noted. Psychiatric: Patient alert and oriented x3, good insight and cognition, good recent to remote recall. Lymph node survey: No cervical axillary or inguinal lymphadenopathy noted. Extremities: The patient has 1-2+ edema in the bilateral lower extremities.     Lab Results:  [facility] 04/29/11 0128 04/28/11 1345  NA -- 138  K -- 4.2  CL -- 102  CO2 -- 25  GLUCOSE -- 140*  BUN -- 15  CREATININE -- 0.89  CALCIUM -- 10.3  MG 2.3 --  PHOS -- --   No results found for this basename: AST:2,ALT:2,ALKPHOS:2,BILITOT:2,PROT:2,ALBUMIN:2 in the last 72 hours No results found for this basename: LIPASE:2,AMYLASE:2 in the last 72 hours  Basename 04/28/11 1345  WBC 10.1  NEUTROABS --  HGB 11.6*  HCT 35.7*  MCV 88.6  PLT 284   No results found for this  basename: CKTOTAL:3,CKMB:3,CKMBINDEX:3,TROPONINI:3 in the last 72 hours No components found with this basename: POCBNP:3 No results found for this basename: DDIMER:2 in the last 72 hours No results found for this basename: HGBA1C:2 in the last 72 hours No results found for this basename: CHOL:2,HDL:2,LDLCALC:2,TRIG:2,CHOLHDL:2,LDLDIRECT:2 in the last 72 hours No results found for this basename: TSH,T4TOTAL,FREET3,T3FREE,THYROIDAB in the last 72 hours No results found for this basename: VITAMINB12:2,FOLATE:2,FERRITIN:2,TIBC:2,IRON:2,RETICCTPCT:2 in the last 72 hours  Micro Results: No results found for this or any previous visit (from the past 240 hour(s)).  Studies/Results: Dg Chest 2 View  04/26/2011  *RADIOLOGY REPORT*  Clinical Data: Shortness of breath.  Wheezing.  CHEST - 2 VIEW  Comparison: 01/23/2011  Findings: Heart size is normal.  The aorta is unfolded.  There is bronchial thickening but no infiltrate, collapse or effusion.  Mild degenerative changes effect the spine.  IMPRESSION: Bronchial thickening without consolidation or collapse.  Original Report Authenticated By: Thomasenia Sales, M.D.   Ct Angio Chest W/cm &/or Wo Cm  04/28/2011  *RADIOLOGY REPORT*  Clinical Data:  Short of breath  CT ANGIOGRAPHY CHEST WITH CONTRAST  Technique:  Multidetector CT imaging of the chest was performed using the standard protocol during bolus administration of intravenous contrast.  Multiplanar CT image reconstructions including MIPs were obtained to evaluate the vascular anatomy.  Contrast: OMNIPAQUE IOHEXOL 300 MG/ML IV SOLN  Comparison:  None  Findings:  There are no filling defects in the pulmonary arterial tree to suggest acute pulmonary thromboembolism.  2.7 x 1.3 cm right hilar lymph node mass.  Borderline enlarged mediastinal adenopathy is scattered.  12 mm precarinal node on image 32.  Small prevascular nodes.  7 mm node at the thoracic inlet on the right on image 9.  Small bilateral  pleural effusions  Dependent atelectasis bilaterally. Mild diffuse bronchiectasis and interlobular septal thickening.  This is nonspecific.  Review of the MIP images confirms the above findings.  IMPRESSION: No evidence of acute pulmonary thromboembolism.  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  Small bilateral pleural effusions.  Abnormal right hilar and mediastinal adenopathy.  Inflammatory and neoplastic etiology are considerations.  At the minimum, 3- 6 months follow-up is recommended to ensure resolution.  Original Report Authenticated By: Donavan Burnet, M.D.    Medications: I have reviewed the patient's current medications. Scheduled Meds:   . albuterol  5 mg Nebulization Once  . aspirin EC  81 mg Oral QHS  . bisoprolol  5 mg Oral Daily  . calcium carbonate  1 tablet Oral Daily  . enoxaparin  40 mg Subcutaneous Q24H  . fluticasone  1 spray Each Nare Daily  . fluticasone  1 puff Inhalation BID  . furosemide  20 mg Intravenous Q12H  . furosemide  40 mg Intravenous Once  . hydrALAZINE  12.5 mg Oral TID  . HYDROcodone-acetaminophen  1 tablet Oral Once  . ipratropium  0.5 mg Nebulization Once  . isosorbide dinitrate  15 mg Oral TID  .  lisinopril  10 mg Oral Daily  . LORazepam  1 mg Oral Once  . methylPREDNISolone (SOLU-MEDROL) injection  125 mg Intravenous Once  . moxifloxacin  400 mg Intravenous Once  . pantoprazole  40 mg Oral Q1200  . potassium chloride  10 mEq Oral BID  . simvastatin  20 mg Oral Daily  . sodium chloride  500 mL Intravenous Once  . sodium chloride  3 mL Intravenous Q12H  . DISCONTD: ciclesonide  1 puff Inhalation BID  . DISCONTD: enalapril  2.5 mg Oral BID  . DISCONTD: pantoprazole  40 mg Oral Q1200   Continuous Infusions:  PRN Meds:.sodium chloride, acetaminophen, ALPRAZolam, dextromethorphan, HYDROcodone-acetaminophen, iohexol, levalbuterol, magic mouthwash, ondansetron (ZOFRAN) IV, sodium chloride,  zolpidem Assessment/Plan: Patient Active Hospital Problem List: Acute on chronic systolic congestive heart failure (02/24/2011)   Assessment: We'll continue IV Lasix on this patient. I will also monitor her BMP and her need for oxygen. I will ask cardiac rehabilitation see the patient to start her on a cardiac rehabilitation program.  Anxiety attack (08/18/2010)   Assessment: Continue Xanax     ABDOMINAL PAIN (11/22/2006)   Assessment: Patient is describing what appears to be peptic ulcer disease pain. The patient is a patient of Dr. Nickolas Madrid bruits in the office however, has deferred any endoscopy at this time she feels that her overall medical status would not allow her to safely tolerate endoscopy. The patient has been on Pepcid I am switching her over to protonic since disease she'll have some improvement in her symptoms. The patient's pain is in the abdomen other the chest wall. Patient also just recently been started on Alvesco which also has as well as side effects musculoskeletal chest wall pain.    Shortness of breath (04/03/2011)   Assessment: Secondary to her acute systolic heart failure. I expect to see improvement as her acute component resolved.     Tachycardia (04/28/2011)   Assessment: The patient is a resting tachycardia, and I suspect that this is due to her deconditioned state. We'll continue to monitor and I will ask cardiac rehabilitation see the patient in consultation.    DVT prophylaxis with Lovenox   LOS: 1 day

## 2011-04-29 NOTE — ED Notes (Signed)
Went to bedside. Patient voiced concern that she had been requesting her evening medicines since before 11pm.  Informed patient that I would check her current orders to determine what she had ordered.

## 2011-04-29 NOTE — ED Notes (Signed)
Patient transferred to stepdown.

## 2011-04-29 NOTE — ED Notes (Signed)
Pt transferred to TCU. Assumed responsibility. Pp placed on droplet precautions for flu-like symptoms( coughing, congestion with SOB). Education provided to patient. Voices understanding. Resting quietly in bed with eyes opened. In no acute distress at this time.

## 2011-04-29 NOTE — ED Notes (Signed)
MD in with patient

## 2011-04-30 DIAGNOSIS — I959 Hypotension, unspecified: Secondary | ICD-10-CM | POA: Diagnosis not present

## 2011-04-30 DIAGNOSIS — R590 Localized enlarged lymph nodes: Secondary | ICD-10-CM

## 2011-04-30 DIAGNOSIS — E876 Hypokalemia: Secondary | ICD-10-CM | POA: Diagnosis present

## 2011-04-30 DIAGNOSIS — J479 Bronchiectasis, uncomplicated: Secondary | ICD-10-CM | POA: Diagnosis present

## 2011-04-30 LAB — CBC
HCT: 32.1 % — ABNORMAL LOW (ref 36.0–46.0)
Hemoglobin: 10.4 g/dL — ABNORMAL LOW (ref 12.0–15.0)
MCH: 28.6 pg (ref 26.0–34.0)
MCHC: 32.4 g/dL (ref 30.0–36.0)
MCV: 88.2 fL (ref 78.0–100.0)
Platelets: 279 10*3/uL (ref 150–400)
RBC: 3.64 MIL/uL — ABNORMAL LOW (ref 3.87–5.11)
RDW: 14.1 % (ref 11.5–15.5)
WBC: 9.6 10*3/uL (ref 4.0–10.5)

## 2011-04-30 LAB — BASIC METABOLIC PANEL
BUN: 25 mg/dL — ABNORMAL HIGH (ref 6–23)
CO2: 27 mEq/L (ref 19–32)
Calcium: 9.1 mg/dL (ref 8.4–10.5)
Chloride: 97 mEq/L (ref 96–112)
Creatinine, Ser: 1.08 mg/dL (ref 0.50–1.10)
GFR calc Af Amer: 61 mL/min — ABNORMAL LOW (ref >90)
GFR calc non Af Amer: 53 mL/min — ABNORMAL LOW (ref >90)
Glucose, Bld: 109 mg/dL — ABNORMAL HIGH (ref 70–99)
Potassium: 3.4 mEq/L — ABNORMAL LOW (ref 3.5–5.1)
Sodium: 134 mEq/L — ABNORMAL LOW (ref 135–145)

## 2011-04-30 LAB — CARDIAC PANEL(CRET KIN+CKTOT+MB+TROPI)
CK, MB: 3.1 ng/mL (ref 0.3–4.0)
Relative Index: 2.2 (ref 0.0–2.5)
Total CK: 140 U/L (ref 7–177)
Troponin I: <0.3 ng/mL (ref <0.30)

## 2011-04-30 MED ORDER — VITAMIN D3 10 MCG (400 UNIT) PO CAPS: 3.0000 | ORAL_CAPSULE | Freq: Every day | ORAL | Status: DC

## 2011-04-30 MED ORDER — CALCIUM CARBONATE ANTACID 500 MG PO CHEW
1.0000 | CHEWABLE_TABLET | Freq: Two times a day (BID) | ORAL | Status: DC | PRN
  Administered 2011-04-30 – 2011-05-01 (×3): 200 mg via ORAL
  Filled 2011-04-30 (×2): qty 1

## 2011-04-30 MED ORDER — LEVOFLOXACIN IN D5W 750 MG/150ML IV SOLN
750.0000 mg | INTRAVENOUS | Status: DC
  Administered 2011-04-30 – 2011-05-04 (×5): 750 mg via INTRAVENOUS
  Filled 2011-04-30 (×6): qty 150

## 2011-04-30 MED ORDER — NON FORMULARY: 2.0000 | Freq: Every day | Status: DC

## 2011-04-30 MED ORDER — ATORVASTATIN CALCIUM 10 MG PO TABS
10.0000 mg | ORAL_TABLET | Freq: Every day | ORAL | Status: DC
  Administered 2011-04-30 – 2011-05-04 (×5): 10 mg via ORAL
  Filled 2011-04-30: qty 1

## 2011-04-30 MED ORDER — SODIUM CHLORIDE 0.9 % IV SOLN
INTRAVENOUS | Status: AC
  Administered 2011-04-30: 16:00:00 via INTRAVENOUS

## 2011-04-30 MED ORDER — CHOLECALCIFEROL 10 MCG (400 UNIT) PO TABS
1200.0000 [IU] | ORAL_TABLET | Freq: Every day | ORAL | Status: DC
  Administered 2011-04-30 – 2011-05-05 (×6): 1200 [IU] via ORAL
  Filled 2011-04-30 (×7): qty 3

## 2011-04-30 MED ORDER — POTASSIUM CHLORIDE CRYS ER 20 MEQ PO TBCR
40.0000 meq | EXTENDED_RELEASE_TABLET | ORAL | Status: AC
  Administered 2011-04-30 (×2): 40 meq via ORAL

## 2011-04-30 MED ORDER — MOMETASONE FUROATE 50 MCG/ACT NA SUSP
1.0000 | Freq: Every day | NASAL | Status: DC
  Administered 2011-04-30 – 2011-05-04 (×5): 1 via NASAL
  Filled 2011-04-30: qty 17

## 2011-04-30 MED ORDER — ISOSORBIDE DINITRATE 5 MG PO TABS
15.0000 mg | ORAL_TABLET | Freq: Three times a day (TID) | ORAL | Status: DC
  Administered 2011-05-01 – 2011-05-05 (×11): 15 mg via ORAL
  Filled 2011-04-30: qty 1

## 2011-04-30 MED ORDER — NON FORMULARY: 10.0000 mg | Freq: Every day | Status: DC

## 2011-04-30 MED ORDER — VITAMIN C 500 MG PO TABS
500.0000 mg | ORAL_TABLET | Freq: Every day | ORAL | Status: DC
  Administered 2011-04-30 – 2011-05-05 (×6): 500 mg via ORAL
  Filled 2011-04-30 (×7): qty 1

## 2011-04-30 NOTE — Progress Notes (Signed)
Subjective: Is that she feels better today in one regard however she's having some nausea and mild dizziness when she sits up. Please note that her blood pressure at present is 82 systolic. The patient is also very particular about using her medications from home and she states that she's had many many adverse reactions with other medications as she does not want to take the chance. I also reviewed the patient's CT scan of her chest which shows no pulmonary embolus but did show bronchiectatic changes as well as mediastinal lymphadenopathy. The patient revealed that her father died of lung cancer at age 50 she is concerned about what these findings may implicate in that regard. The patient also stated that she was coughing up brownish sputum and presently is under the care of pulmonologist in [place]. Today the patient is able to lay flat in the supine position without any difficulty breathing. She still complains of pain in the left chest wall when coughing however the pain in the epigastric area is much improved. Objective: Filed Vitals:   04/30/11 0300 04/30/11 0800 04/30/11 1500 04/30/11 1510  BP: 101/60 102/46 83/58 83/62   Pulse:  99 92   Temp: 98.1 F (36.7 C) 99.3 F (37.4 C) 98.8 F (37.1 C)   TempSrc: Oral Oral Oral   Resp:  17 18   Height:      Weight:      SpO2:  98% 96%    Weight change: 7.432 kg (16 lb 6.1 oz)  Intake/Output Summary (Last 24 hours) at 04/30/11 1703 Last data filed at 04/30/11 1300  Gross per 24 hour  Intake    304 ml  Output   1901 ml  Net  -1597 ml    General: Alert, awake, oriented x3, in no acute distress.  HEENT: [place]/AT PEERL, EOMI Neck: Trachea midline,  no masses, no thyromegal,y no JVD, no carotid bruit OROPHARYNX:  Moist, No exudate/ erythema/lesions.  Heart: Regular rate and rhythm, without murmurs, rubs, gallops, PMI non-displaced, no heaves or thrills on palpation.  Lungs: Clear to auscultation, no wheezing or rhonchi noted. No increased vocal  fremitus resonant to percussion  Abdomen: Soft, nontender, nondistended, positive bowel sounds, no masses no hepatosplenomegaly noted..  Neuro: No focal neurological deficits noted cranial nerves II through XII grossly intact. DTRs 2+ bilaterally upper and lower extremities. Strength functional in bilateral upper and lower extremities. Musculoskeletal: No warm swelling or erythema around joints, no spinal tenderness noted. Psychiatric: Patient alert and oriented x3, good insight and cognition, good recent to remote recall. Lymph node survey: No cervical axillary or inguinal lymphadenopathy noted.     Lab Results:  Basename 04/29/11 2345 04/29/11 1016 04/29/11 0128 04/28/11 1345  NA 134* -- -- 138  K 3.4* -- -- 4.2  CL 97 -- -- 102  CO2 27 -- -- 25  GLUCOSE 109* -- -- 140*  BUN 25* -- -- 15  CREATININE 1.08 0.94 -- --  CALCIUM 9.1 -- -- 10.3  MG -- -- 2.3 --  PHOS -- -- -- --   No results found for this basename: AST:2,ALT:2,ALKPHOS:2,BILITOT:2,PROT:2,ALBUMIN:2 in the last 72 hours No results found for this basename: LIPASE:2,AMYLASE:2 in the last 72 hours  Basename 04/29/11 2345 04/29/11 1016  WBC 9.6 19.9*  NEUTROABS -- --  HGB 10.4* 13.4  HCT 32.1* 40.2  MCV 88.2 87.6  PLT 279 318    Basename 04/29/11 2345 04/29/11 1552  CKTOTAL 140 139  CKMB 3.1 3.6  CKMBINDEX -- --  TROPONINI <0.30 <  0.30   No components found with this basename: POCBNP:3 No results found for this basename: DDIMER:2 in the last 72 hours No results found for this basename: HGBA1C:2 in the last 72 hours No results found for this basename: CHOL:2,HDL:2,LDLCALC:2,TRIG:2,CHOLHDL:2,LDLDIRECT:2 in the last 72 hours  Basename 04/29/11 0128  TSH 0.245*  T4TOTAL --  T3FREE --  THYROIDAB --   No results found for this basename: VITAMINB12:2,FOLATE:2,FERRITIN:2,TIBC:2,IRON:2,RETICCTPCT:2 in the last 72 hours  Micro Results: Recent Results (from the past 240 hour(s))  URINE CULTURE     Status: Normal  (Preliminary result)   Collection Time   04/28/11 10:41 PM      Component Value Range Status Comment   Specimen Description URINE, CLEAN CATCH   Final    Special Requests NONE   Final    Setup Time [id number]   Final    Colony Count 20,OOO COLONIES/ML   Final    Culture ESCHERICHIA COLI   Final    Report Status PENDING   Incomplete   MRSA PCR SCREENING     Status: Normal   Collection Time   04/29/11  2:40 PM      Component Value Range Status Comment   MRSA by PCR NEGATIVE  NEGATIVE  Final     Studies/Results: Dg Chest 2 View  04/26/2011  *RADIOLOGY REPORT*  Clinical Data: Shortness of breath.  Wheezing.  CHEST - 2 VIEW  Comparison: 01/23/2011  Findings: Heart size is normal.  The aorta is unfolded.  There is bronchial thickening but no infiltrate, collapse or effusion.  Mild degenerative changes effect the spine.  IMPRESSION: Bronchial thickening without consolidation or collapse.  Original Report Authenticated By: Thomasenia Sales, M.D.   Ct Angio Chest W/cm &/or Wo Cm  04/28/2011  *RADIOLOGY REPORT*  Clinical Data:  Short of breath  CT ANGIOGRAPHY CHEST WITH CONTRAST  Technique:  Multidetector CT imaging of the chest was performed using the standard protocol during bolus administration of intravenous contrast.  Multiplanar CT image reconstructions including MIPs were obtained to evaluate the vascular anatomy.  Contrast: OMNIPAQUE IOHEXOL 300 MG/ML IV SOLN  Comparison:  None  Findings:  There are no filling defects in the pulmonary arterial tree to suggest acute pulmonary thromboembolism.  2.7 x 1.3 cm right hilar lymph node mass.  Borderline enlarged mediastinal adenopathy is scattered.  12 mm precarinal node on image 32.  Small prevascular nodes.  7 mm node at the thoracic inlet on the right on image 9.  Small bilateral pleural effusions  Dependent atelectasis bilaterally. Mild diffuse bronchiectasis and interlobular septal thickening.  This is nonspecific.  Review of the MIP images  confirms the above findings.  IMPRESSION: No evidence of acute pulmonary thromboembolism.  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  Small bilateral pleural effusions.  Abnormal right hilar and mediastinal adenopathy.  Inflammatory and neoplastic etiology are considerations.  At the minimum, 3- 6 months follow-up is recommended to ensure resolution.  Original Report Authenticated By: Donavan Burnet, M.D.    Medications: I have reviewed the patient's current medications. Scheduled Meds:   . aspirin EC  81 mg Oral QHS  . atorvastatin  10 mg Oral q1800  . bisoprolol  5 mg Oral Daily  . calcium carbonate  1 tablet Oral Daily  . cholecalciferol  1,200 Units Oral Daily  . enoxaparin  40 mg Subcutaneous Q24H  . furosemide  20 mg Intravenous Q12H  . hydrALAZINE  12.5 mg Oral TID  .  isosorbide dinitrate  15 mg Oral TID  . levofloxacin (LEVAQUIN) IV  750 mg Intravenous Q24H  . mometasone  1 spray Each Nare Daily  . pantoprazole  40 mg Oral Q1200  . potassium chloride  10 mEq Oral BID  . potassium chloride  40 mEq Oral Q2H  . sodium chloride  3 mL Intravenous Q12H  . vitamin C  500 mg Oral Daily  . DISCONTD: ciclesonide  1 puff Inhalation BID  . DISCONTD: fluticasone  1 spray Each Nare Daily  . DISCONTD: isosorbide dinitrate  15 mg Oral TID  . DISCONTD: lisinopril  10 mg Oral Daily  . DISCONTD: NON FORMULARY 10 mg  10 mg Oral QHS  . DISCONTD: NON FORMULARY 2 spray  2 spray Each Nare Daily  . DISCONTD: simvastatin  20 mg Oral Daily  . DISCONTD: Vitamin D3  3 capsule Oral Daily   Continuous Infusions:   . sodium chloride 50 mL/hr at 04/30/11 1600   PRN Meds:.sodium chloride, acetaminophen, ALPRAZolam, HYDROcodone-acetaminophen, levalbuterol, magic mouthwash, ondansetron (ZOFRAN) IV, sodium chloride, zolpidem, DISCONTD: dextromethorphan Assessment/Plan: Patient Active Hospital Problem List: Acute on chronic systolic congestive heart  failure (02/24/2011)   Assessment: The patient has responded well to Lasix in regard to her respiratory status. However the Lasix appears to have caused her to have some hypotension. I think the patient and her husband who acts fluid balance presents a thin line between 2 much fluid and not enough for perfusion. At this time I will gently rehydrate the patient hold her Lasix and and hold the lisinopril for now.     Hypotension (04/30/2011)   Assessment: Felt to be a combination of her diuresis as well as her antihypertensive. I will hold her diuretics and her lisinopril and gently rehydrate her at present.     Anxiety attack (08/18/2010)   Assessment: Patient much, less anxious today. This patient responds very well to being able to manage her expectations that she knows what they are. She is felt that with regard to her cough and wheezing she has not know what to expect and this makes her quite anxious. The patient also has a lot of anxiety associated with the ingestion of medications and prefers only to take her medications from home.     Bronchiectasis (04/30/2011)   Assessment: Patient has bronchiectatic changes on the CT scan. She denies a history of bronchiectasis however she is having an odd dark-colored sputum. August 2 culture and start her on Levaquin. Also consult pulmonology to see her tomorrow since she is under the care of pulmonologist an ongoing basis     ABDOMINAL PAIN (11/22/2006)   Assessment: The patient's abdominal pain is improved. It seems to be most prominent when she is coughing in the left lower chest wall.    Shortness of breath (04/03/2011)   Assessment: Markedly improved with diuresis. Felt to be secondary to her systolic dysfunction.    Tachycardia (04/28/2011)   Assessment: The tachycardia is markedly improved. I feel that it was multifactorial owing to her anxiety is well as the effect of albuterol. The patient is actually being treated with Xopenex and this appears to  be working well for    Hypokalemia (04/30/2011)   Assessment: Replete orally     Mediastinal lymphadenopathy (04/30/2011)   Assessment: Mediastinal lymphadenopathy is concerning however it is not clear as to whether or not this is reactive nonreactive. I will go ahead and treat her with Levaquin and she should have followup CT scans to  evaluate this at a later date. I will discuss this with pulmonology.    MATTHEWS,MICHELLE A. PAGER # [phone]    LOS: 2 days

## 2011-05-01 ENCOUNTER — Encounter (HOSPITAL_COMMUNITY): Payer: Self-pay | Admitting: Cardiology

## 2011-05-01 ENCOUNTER — Inpatient Hospital Stay (HOSPITAL_COMMUNITY): Payer: Medicare Other

## 2011-05-01 DIAGNOSIS — R0602 Shortness of breath: Secondary | ICD-10-CM

## 2011-05-01 DIAGNOSIS — R062 Wheezing: Secondary | ICD-10-CM

## 2011-05-01 DIAGNOSIS — R0902 Hypoxemia: Secondary | ICD-10-CM

## 2011-05-01 DIAGNOSIS — J209 Acute bronchitis, unspecified: Secondary | ICD-10-CM | POA: Clinically undetermined

## 2011-05-01 DIAGNOSIS — J479 Bronchiectasis, uncomplicated: Secondary | ICD-10-CM

## 2011-05-01 DIAGNOSIS — I501 Left ventricular failure: Secondary | ICD-10-CM

## 2011-05-01 LAB — BASIC METABOLIC PANEL
BUN: 28 mg/dL — ABNORMAL HIGH (ref 6–23)
CO2: 24 mEq/L (ref 19–32)
Calcium: 9.9 mg/dL (ref 8.4–10.5)
Chloride: 102 mEq/L (ref 96–112)
Creatinine, Ser: 1 mg/dL (ref 0.50–1.10)
GFR calc Af Amer: 67 mL/min — ABNORMAL LOW (ref >90)
GFR calc non Af Amer: 58 mL/min — ABNORMAL LOW (ref >90)
Glucose, Bld: 98 mg/dL (ref 70–99)
Potassium: 4.7 mEq/L (ref 3.5–5.1)
Sodium: 136 mEq/L (ref 135–145)

## 2011-05-01 LAB — URINE CULTURE: Culture  Setup Time: 201212150513

## 2011-05-01 MED ORDER — NON FORMULARY: 40.0000 mg | Freq: Two times a day (BID) | Status: DC

## 2011-05-01 MED ORDER — METHYLPREDNISOLONE SODIUM SUCC 125 MG IJ SOLR
60.0000 mg | Freq: Two times a day (BID) | INTRAMUSCULAR | Status: DC
  Administered 2011-05-01 – 2011-05-05 (×8): 60 mg via INTRAVENOUS
  Filled 2011-05-01 (×10): qty 0.96

## 2011-05-01 MED ORDER — OMEPRAZOLE 20 MG PO CPDR
40.0000 mg | DELAYED_RELEASE_CAPSULE | Freq: Two times a day (BID) | ORAL | Status: DC
  Administered 2011-05-02 – 2011-05-04 (×4): 40 mg via ORAL
  Filled 2011-05-01: qty 2

## 2011-05-01 NOTE — Progress Notes (Signed)
05/01/11 2200  Pt requests note to be made in her chart. She believes that the sheets, blankets and comforters that we have are causing the rash that is on her back. Pt has her own sheets and blankets on the bed and her own personal clothes. [name]

## 2011-05-01 NOTE — Consult Note (Addendum)
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  We saw her in the office last week when she had cough, sore throat, subjective fevers.  A rapid flu was negative and we suggested symptomatic therapy.  As the week progressed she developed worsening cough, wheezing and eventually swelling in her legs.  She states that she had decreased her lasix to 1/2 of a 20mg  pill.  During this hospitalization she has had a low grade temp once and no elevated WBC.  We are consulted for bronchietasis, mediastinal lymphadenopathy, and wheezing.  Lines / Drains:   Cultures / Sepsis markers: 12/14 Urine: 20K E.coli 12/16 sputum: ngtd  Antibiotics: 12/16 Levaquin  Tests / Events: 12/14 CT angio chest: no PE, bilateral effusions, interstitial thickening, R hilar lymphadenopathy up to 2.7 cm in diameter     Past Medical History  Diagnosis Date  . Lump or mass in breast   . Carpal tunnel syndrome   . Migraine without aura, without mention of intractable migraine without mention of status migrainosus   . Diarrhea   . Diverticulosis of colon     no diverticulitis  . Fibromyalgia   . Pure hypercholesterolemia   . Irritable bowel syndrome   . Laryngeal nodule     Dr. Thelma Comp  . Fibrocystic breast disease   . Osteoarthritis   . Genital herpes   . Hemorrhoids   . Anxiety   . GERD (gastroesophageal reflux disease)   . Systolic CHF     EF 25-35%, LV dilation, significant MR  . Nonischemic cardiomyopathy   . Pneumonia   . Mitral regurgitation   . Anxiety   . Pleural effusion, bilateral 2012   Past Surgical History  Procedure Date  . Appendectomy   . Mandible surgery   . Knee arthroscopy w/ orif     Left  . Osteotomy     Left foot  . Breast cystectomies     due to FCBD    . Total abdominal hysterectomy w/ bilateral salpingoophorectomy 1987    Endometriosis  . Dexa 03/28/99    osteopenia L/S  . Colonoscopy 1998    nml (sigmoid-Gilbert-nol)  . Head mri, head ct 10/1998    ? H/A focus ([facility])  . Emg/mcv 10/16/01    + mild carpal tunnel  . Dexa 03/12/03    2.2 Spine 0.1 Hip  Osteopenia  . Mri right hip 12/12/01    Tendonitis Gluteus Medius to Gtr Trochanter Neg Bursitis (Dr. Montez Morita)  . L/s films 11/21/01    nml; Right hip nml  . Mri u/s 11/21/01    min. DDD L/3-4  . Colonoscopy 01/04/04    polyps, diverticulosis/severe  . Dexa 11/14/05    1.9 Spine 0.3 Hip  slight improvement  . Flex laryngoscopy 06/11/06    (Juengel) nml  . Dexa 11/22/06    improved osteo and fem neck  . [facility] bunionectomy 11/04/08    Right Great Toe (Dr. Lestine Box)  . Breast mass excision 01/2010    Left-fibrocystic change w/intraductal papilloma, no malignancy  . Ct chest     12/2010 - possible bronchiectasis lower lobes, 02/2011 - enlarged bilateral effusions, bibasilar atx  . Stress myoview 02/2011    no significant ischemia   Prior to Admission medications   Medication Sig Start Date  End Date Taking? Authorizing Provider  ALPRAZolam (XANAX) 0.25 MG tablet Take 0.25 mg by mouth. Takes half a pill prn   Yes Historical Provider, MD  Alum & Mag Hydroxide-Simeth (MAGIC MOUTHWASH) SOLN Take 5 mLs by mouth 4 (four) times daily as needed. 04/26/11  Yes Max Fickle, MD  aspirin 81 MG EC tablet Take 81 mg by mouth at bedtime.    Yes Historical Provider, MD  atorvastatin (LIPITOR) 10 MG tablet Take 1 tablet (10 mg total) by mouth daily. Brand medically necessary. 08/18/10  Yes [name], MD  calcium carbonate (TUMS - DOSED IN MG ELEMENTAL CALCIUM) 500 MG chewable tablet Chew 1 tablet by mouth daily. heartburn    Yes Historical Provider, MD  Cholecalciferol (VITAMIN D3) 400 UNITS CAPS Take 3 capsules by mouth daily.    Yes Historical Provider, MD  ciclesonide (ALVESCO) 160 MCG/ACT  inhaler Inhale 1 puff into the lungs 2 (two) times daily.     Yes Historical Provider, MD  dextromethorphan (DELSYM) 30 MG/5ML liquid 2 tsp every 12 hours as needed    Yes Historical Provider, MD  furosemide (LASIX) 20 MG tablet Take 10 mg by mouth daily. 1/2 tablet twice daily 04/12/11  Yes Antonieta Iba, MD  hydrALAZINE (APRESOLINE) 25 MG tablet Take 12.5 mg by mouth 3 (three) times daily.   04/03/11 04/02/12 Yes Max Fickle, MD  HYDROcodone-acetaminophen (VICODIN ES) 7.5-750 MG per tablet Take 1 tablet by mouth every 6 (six) hours as needed. pain   Yes Historical Provider, MD  isosorbide dinitrate (ISORDIL) 30 MG tablet Take 1/2 tablet three times a day. 04/17/11  Yes Antonieta Iba, MD  levalbuterol (XOPENEX) 1.25 MG/3ML nebulizer solution Take 1.25 mg by nebulization every 4 (four) hours as needed. 03/27/11  Yes Tammy Parrett, NP  mometasone (NASONEX) 50 MCG/ACT nasal spray Place 1 spray into the nose 2 (two) times daily.    Yes Historical Provider, MD  naproxen sodium (ANAPROX) 220 MG tablet Per bottle   Yes Historical Provider, MD  omeprazole (PRILOSEC) 40 MG capsule Take 40 mg by mouth 2 (two) times daily.     Yes Historical Provider, MD  potassium chloride (K-DUR) 10 MEQ tablet Take 10 mEq by mouth 2 (two) times daily.   04/12/11  Yes Antonieta Iba, MD  VENTOLIN HFA 108 (90 BASE) MCG/ACT inhaler Take 2 puffs by mouth Every 4 hours as needed. 03/01/11  Yes Historical Provider, MD  vitamin C (ASCORBIC ACID) 500 MG tablet Take 500 mg by mouth daily.     Yes Historical Provider, MD  zolpidem (AMBIEN) 5 MG tablet 1/2 to 1 tablet at bedtime as needed For insomnia   01/20/11  Yes [name], MD   Allergies  Allergen Reactions  . Antihistamines, Chlorpheniramine-Type Other (See Comments)    Makes her eyes look like she sees strobe lights  . Atorvastatin     REACTION: muscle cramps. Pt states she's taken this now  . Influenza Vac Split (Flu Virus Vaccine) Other (See Comments)     Body aches  . Latex   . Penicillins     REACTION: immune to it, able to take amoxicillin  . Promethazine Hcl     REACTION: Dizzy, headache  . Rosuvastatin     REACTION: not effective  . Sulfasalazine    Family History  Problem Relation Age of Onset  . Lung cancer Father     + smoker  . Dementia Mother   . Osteoporosis Mother     Lumbar  spine  . Stroke Mother     x 5 @ 47 YOA  . Arthritis Mother     hands  . Emphysema Mother   . Alcohol abuse Paternal Grandfather   . Hypertension Maternal Grandfather     ?  . Stroke Paternal Grandfather     ?  Marland Kitchen Heart disease      grandmother  . Heart disease      grandfather   Social History  reports that she has never smoked. She has never used smokeless tobacco. She reports that she does not drink alcohol or use illicit drugs.  Review Of Systems   Gen: + fever, chills, weight change, fatigue, night sweats HEENT: Denies blurred vision, double vision, hearing loss, tinnitus, sinus congestion, rhinorrhea, sore throat, neck stiffness, dysphagia PULM: per hpi CV: some mild chest pain with cough, positive swelling GI: Denies abdominal pain, nausea, vomiting, diarrhea, hematochezia, melena, constipation, change in bowel habits GU: Denies dysuria, hematuria, polyuria, oliguria, urethral discharge Endocrine: Denies hot or cold intolerance, polyuria, polyphagia or appetite change Derm: Denies rash, dry skin, scaling or peeling skin change Heme: Denies easy bruising, bleeding, bleeding gums Neuro: Denies headache, numbness, weakness, slurred speech, loss of memory or consciousness  Vital Signs:   Filed Vitals:   05/01/11 1430 05/01/11 1450 05/01/11 2131 05/01/11 2340  BP: 96/67  96/68   Pulse: 88  95   Temp: 98.2 F (36.8 C)  98 F (36.7 C)   TempSrc: Oral  Oral   Resp: 19  18   Height:      Weight:      SpO2: 99% 98% 98% 96%    Physical Examination: Gen: coughing, no respiratory distress HEENT: NCAT, PERRL, EOMi, OP clear, neck  supple without masses PULM: Exp wheezing throughout, few insp crackles, diminished in bases bilaterally CV: RRR, no mgr, elevated JVD noted AB: BS+, soft, nontender, no hsm Ext: warm, edema in legs noted, no clubbing, no cyanosis Derm: no rash or skin breakdown Neuro: A&Ox4, CN II-XII intact, strength 5/5 in all 4 extremities  Assessment and Plan:  65 y/o female known to me from the office presented on 04/28/11 with worsening shortness of breath, cough, swelling, and mild hypoxemia.  I saw Ms. Mudry in the office last week prior to her hospitalization and what strikes me most about her exam tonight is that she has elevated JVD and more edema than before and she is wheezing more.  She also has effusions on exam that were not there last week.  I have reviewed her Chest CT and see interstitial thickening and pleural effusions.  The effusions were not present on the CXR I performed last week.  I think that what is most likely going on here is that Ms. Kauer has an upper airway infection that has led to decompensated heart failure.  She is volume up on my exam compared to her exam in the office last week.   Please see my detailed assessment of her multiple problems below.  Shortness of breath (04/03/2011)   Assessment: I think that this is multi-factorial.  Objectively she is volume up, producing sputum, and wheezing.  I feel that she should be diuresed more.  I do not feel that she has interstitial lung disease and that the findings of interstitial thickening are due to volume overload.  These findings were not present on prior CT scans just two months ago.    I think that she has bronchitis and needs antibiotics as you are  doing.  See discussion of bronchiectasis below.  She is wheezing, but has actually had a negative metacholine challenge test one year ago, so asthma is unlikely.  She has a component of vocal cord dysfunction, but this wheezing sounds like it is coming from her lower airways.   I suspect that this is due to her volume overload as she often wheezes when she gets volume up.  Further, I think that she may be sensitive to the bystolic and this is causing wheezing as she showed some obstruction on PFT's on this before and then felt much better when she d/c'd it herself (it has been restarted in house).    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  She does not have a chronic sputum production and I think that it is very unlikely to be contributing much here.  Could be due to chronic aspiration given distribution.   Plan:  -I have ordered a swallow eval -agree with levaquin, favor short course (7 days)  Acute on chronic systolic congestive heart failure (02/24/2011)   Assessment: As discussed above, I think that she is volume up.       Plan:  -I would ask cardiology to reconsider her volume status and consider diuresis.  Mediastinal lymphadenopathy (04/30/2011)   Assessment: I feel that this is likely reactive due to the infectious syndrome she has now and the fact that these nodes were not enlarged on a 02/2011 CT scan at [facility].   Plan:  -treat for infection with levaquin -repeat CT in 6 weeks  Tachycardia (04/28/2011)   Assessment:    Plan:  -would appreciate cardiology's assistance with this as she has self d/c'd the bystolic as an outpatient, and she remains tachycardic at baseline  Heber Bixby, M.D. Pulmonary and Critical Care Medicine [facility] Pager: [phone]  05/01/2011, 8:04 PM

## 2011-05-01 NOTE — Consult Note (Signed)
Reason for Consult:Dyspnea and hypotension. Referring Physician: Dr. Windle Guard is an 65 y.o. female.  HPI: This pleasant 65 year old woman is being seen for valuation of dyspnea and cough and recent development of low blood pressure.  The patient has a history of a nonischemic cardiomyopathy.  She had an echocardiogram in October 2012 which showed an ejection fraction of 25-35% with left ventricular dilatation and began to get mitral regurgitation.  She is followed in [place] where she lives by Dr. Mariah Milling.  The patient gives a history that in [place] she has been evaluated by pulmonologist as well as her cardiologist.  She states that she used to have high blood pressure and was hospitalized in September and again in October with exacerbation of dyspnea.  With more aggressive diuresis she states that she has lost a total of 14 pounds since September.  Patient does not have any history of known ischemic heart disease.  She does have a history of a chronic cough with frequent exacerbations.  On admission this time to [facility] she was coughing darkish sputum which has lightened up since being placed on Levaquin.  Her blood pressure over the last 36 hours has been low following diuresis and Lasix is on hold.  Past Medical History  Diagnosis Date  . Lump or mass in breast   . Carpal tunnel syndrome   . Migraine without aura, without mention of intractable migraine without mention of status migrainosus   . Diarrhea   . Diverticulosis of colon     no diverticulitis  . Fibromyalgia   . Pure hypercholesterolemia   . Irritable bowel syndrome   . Laryngeal nodule     Dr. Thelma Comp  . Fibrocystic breast disease   . Osteoarthritis   . Genital herpes   . Hemorrhoids   . Anxiety   . GERD (gastroesophageal reflux disease)   . Systolic CHF     EF 25-35%, LV dilation, significant MR  . Nonischemic cardiomyopathy   . Pneumonia   . Mitral regurgitation   . Anxiety   .  Pleural effusion, bilateral 2012    Past Surgical History  Procedure Date  . Appendectomy   . Mandible surgery   . Knee arthroscopy w/ orif     Left  . Osteotomy     Left foot  . 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 Total abdominal hysterectomy w/ bilateral salpingoophorectomy 1987    Endometriosis  . Dexa 03/28/99    osteopenia L/S  . Colonoscopy 1998    nml (sigmoid-Gilbert-nol)  . Head mri, head ct 10/1998    ? H/A focus ([facility])  . Emg/mcv 10/16/01    + mild carpal tunnel  . Dexa 03/12/03    2.2 Spine 0.1 Hip  Osteopenia  . Mri right hip 12/12/01    Tendonitis Gluteus Medius to Gtr Trochanter Neg Bursitis (Dr. Montez Morita)  . L/s films 11/21/01    nml; Right hip nml  . Mri u/s 11/21/01    min. DDD L/3-4  . Colonoscopy 01/04/04    polyps, diverticulosis/severe  . Dexa 11/14/05    1.9 Spine 0.3 Hip  slight improvement  . Flex laryngoscopy 06/11/06    (Juengel) nml  . Dexa 11/22/06    improved osteo and fem neck  . [facility] bunionectomy 11/04/08    Right Great Toe (Dr. Lestine Box)  . Breast mass excision 01/2010    Left-fibrocystic change w/intraductal papilloma, no malignancy  . Ct chest  12/2010 - possible bronchiectasis lower lobes, 02/2011 - enlarged bilateral effusions, bibasilar atx  . Stress myoview 02/2011    no significant ischemia    Family History  Problem Relation Age of Onset  . Lung cancer Father     + smoker  . Dementia Mother   . Osteoporosis Mother     Lumbar spine  . Stroke Mother     x 5 @ 26 YOA  . Arthritis Mother     hands  . Emphysema Mother   . Alcohol abuse Paternal Grandfather   . Hypertension Maternal Grandfather     ?  . Stroke Paternal Grandfather     ?  Marland Kitchen Heart disease      grandmother  . Heart disease      grandfather    Social History:  reports that she has never smoked. She has never used smokeless tobacco. She reports that she does not drink alcohol or use illicit drugs.  Allergies:  Allergies  Allergen Reactions  .  Antihistamines, Chlorpheniramine-Type Other (See Comments)    Makes her eyes look like she sees strobe lights  . Atorvastatin     REACTION: muscle cramps. Pt states she's taken this now  . Influenza Vac Split (Flu Virus Vaccine) Other (See Comments)    Body aches  . Latex   . Penicillins     REACTION: immune to it, able to take amoxicillin  . Promethazine Hcl     REACTION: Dizzy, headache  . Rosuvastatin     REACTION: not effective  . Sulfasalazine     Medications: Current medications have been reviewed by me.  Results for orders placed during the hospital encounter of 04/28/11 (from the past 48 hour(s))  CARDIAC PANEL(CRET KIN+CKTOT+MB+TROPI)     Status: Normal   Collection Time   04/29/11 11:45 PM      Component Value Range Comment   Total CK 140  7 - 177 (U/L)    CK, MB 3.1  0.3 - 4.0 (ng/mL)    Troponin I <0.30  <0.30 (ng/mL)    Relative Index 2.2  0.0 - 2.5    CBC     Status: Abnormal   Collection Time   04/29/11 11:45 PM      Component Value Range Comment   WBC 9.6  4.0 - 10.5 (K/uL)    RBC 3.64 (*) 3.87 - 5.11 (MIL/uL)    Hemoglobin 10.4 (*) 12.0 - 15.0 (g/dL) DELTA CHECK NOTED   HCT 32.1 (*) 36.0 - 46.0 (%)    MCV 88.2  78.0 - 100.0 (fL)    MCH 28.6  26.0 - 34.0 (pg)    MCHC 32.4  30.0 - 36.0 (g/dL)    RDW 16.1  09.6 - 04.5 (%)    Platelets 279  150 - 400 (K/uL)   BASIC METABOLIC PANEL     Status: Abnormal   Collection Time   04/29/11 11:45 PM      Component Value Range Comment   Sodium 134 (*) 135 - 145 (mEq/L)    Potassium 3.4 (*) 3.5 - 5.1 (mEq/L)    Chloride 97  96 - 112 (mEq/L)    CO2 27  19 - 32 (mEq/L)    Glucose, Bld 109 (*) 70 - 99 (mg/dL)    BUN 25 (*) 6 - 23 (mg/dL)    Creatinine, Ser 4.09  0.50 - 1.10 (mg/dL)    Calcium 9.1  8.4 - 10.5 (mg/dL)    GFR calc non Af Denyse Dago  53 (*) >90 (mL/min)    GFR calc Af Amer 61 (*) >90 (mL/min)   CULTURE, RESPIRATORY     Status: Normal (Preliminary result)   Collection Time   04/30/11  6:03 PM      Component  Value Range Comment   Specimen Description SPUTUM      Special Requests NONE      Gram Stain PENDING      Culture Culture reincubated for better growth      Report Status PENDING     BASIC METABOLIC PANEL     Status: Abnormal   Collection Time   05/01/11  4:40 AM      Component Value Range Comment   Sodium 136  135 - 145 (mEq/L)    Potassium 4.7  3.5 - 5.1 (mEq/L)    Chloride 102  96 - 112 (mEq/L)    CO2 24  19 - 32 (mEq/L)    Glucose, Bld 98  70 - 99 (mg/dL)    BUN 28 (*) 6 - 23 (mg/dL)    Creatinine, Ser 2.95  0.50 - 1.10 (mg/dL)    Calcium 9.9  8.4 - 10.5 (mg/dL)    GFR calc non Af Amer 58 (*) >90 (mL/min)    GFR calc Af Amer 67 (*) >90 (mL/min)     No results found.  The review of systems reveals that she has not had any history of hemoptysis.  Recent CT angiogram was negative for pulmonary emboli.  She has not been aware of for any recent edema of the feet.  All other systems negative in detail. Blood pressure 96/67, pulse 88, temperature 98.2 F (36.8 C), temperature source Oral, resp. rate 19, height 5\' 5"  (1.651 m), weight 154 lb 8 oz (70.081 kg), SpO2 98.00%. I checked her blood pressure in the right arm myself supine and the blood pressure was 88/60. The patient appears to be in no distress.  Head and neck exam reveals that the pupils are equal and reactive.  The extraocular movements are full.  There is no scleral icterus.  Mouth and pharynx are benign.  No lymphadenopathy.  No carotid bruits.  The jugular venous pressure is normal.  Thyroid is not enlarged or tender.  The chest reveals bilateral inspiratory and expiratory rales and rhonchi which do not clear with cough.  Heart reveals no abnormal lift or heave.  First and second heart sounds are normal.  There is no  gallop rub or click.  There is a faint apical systolic murmur which is not presently impressive but may reflect the fact that her systolic blood pressure is low causing the murmur to decrease in  intensity.  The abdomen is soft and nontender.  Bowel sounds are normoactive.  There is no hepatosplenomegaly or mass.  There are no abdominal bruits.  Extremities reveal no phlebitis or edema.  Pedal pulses are good.  There is no cyanosis or clubbing.  Neurologic exam is normal strength and no lateralizing weakness.  No sensory deficits.  Integument reveals no rash Assessment/Plan:  1. acute on chronic systolic congestive heart failure secondary to nonischemic dilated cardiomyopathy.  She has been on low-dose bisoprolol for her cardiomyopathy.  She is not on an ACE inhibitor because of her pre-existing problems with cough.  2. hypotension secondary to overdiuresis.  I noted that at home the patient was on a very low dose of furosemide.  3. I suspect a strong component of intrinsic lung disease causing her ongoing cough.  Her chest  x-ray was reviewed and does not show cardiomegaly. CT angio raises question of interstitial lung disease and possible right hilar adenopathy.   Recommendation 1.  Will repeat her echocardiogram looking specifically at the degree of mitral regurgitation and to see if there has been any improvement in her left ventricular function since starting the bisoprolol.  2.  Continue to furosemide in the face of hypotension.  We'll recheck chest x-ray.  3. Consider pulmonary consult.    Cassell Clement 05/01/2011, 6:08 PM

## 2011-05-01 NOTE — Progress Notes (Signed)
Subjective: Patient states that she continues to have nausea which she feels is secondary to the protonix. She states that she's had the same reaction with protonix in the past. He denies any dizziness or any wheezing today but she does continue to have a bronchitic type cough which is noted since today. Today the patient continues to be able to lay flat in the supine position without any difficulty breathing. Filed Vitals:   05/01/11 0629 05/01/11 1000 05/01/11 1430 05/01/11 1450  BP: 108/72 104/56 96/67   Pulse: 97  88   Temp:   98.2 F (36.8 C)   TempSrc:   Oral   Resp:   19   Height:      Weight:      SpO2:   99% 98%   Weight change: -9.019 kg (-19 lb 14.1 oz)  Intake/Output Summary (Last 24 hours) at 05/01/11 1817 Last data filed at 05/01/11 1430  Gross per 24 hour  Intake 761.67 ml  Output    201 ml  Net 560.67 ml    General: Alert, awake, oriented x3, in no acute distress.  HEENT: [place]/AT PEERL, EOMI Neck: Trachea midline,  no masses, no thyromegal,y no JVD, no carotid bruit OROPHARYNX:  Moist, No exudate/ erythema/lesions.  Heart: Regular rate and rhythm, without murmurs, rubs, gallops, PMI non-displaced, no heaves or thrills on palpation.  Lungs: Clear to auscultation, no wheezing or rhonchi noted. No increased vocal fremitus resonant to percussion  Abdomen: Soft, nontender, nondistended, positive bowel sounds, no masses no hepatosplenomegaly noted..  Neuro: No focal neurological deficits noted cranial nerves II through XII grossly intact. DTRs 2+ bilaterally upper and lower extremities. Strength functional in bilateral upper and lower extremities. Musculoskeletal: No warm swelling or erythema around joints, no spinal tenderness noted. Psychiatric: Patient alert and oriented x3, good insight and cognition, good recent to remote recall. Lymph node survey: No cervical axillary or inguinal lymphadenopathy noted.     Lab Results:  Basename 05/01/11 0440 04/29/11 2345  04/29/11 0128  NA 136 134* --  K 4.7 3.4* --  CL 102 97 --  CO2 24 27 --  GLUCOSE 98 109* --  BUN 28* 25* --  CREATININE 1.00 1.08 --  CALCIUM 9.9 9.1 --  MG -- -- 2.3  PHOS -- -- --   No results found for this basename: AST:2,ALT:2,ALKPHOS:2,BILITOT:2,PROT:2,ALBUMIN:2 in the last 72 hours No results found for this basename: LIPASE:2,AMYLASE:2 in the last 72 hours  Basename 04/29/11 2345 04/29/11 1016  WBC 9.6 19.9*  NEUTROABS -- --  HGB 10.4* 13.4  HCT 32.1* 40.2  MCV 88.2 87.6  PLT 279 318    Basename 04/29/11 2345 04/29/11 1552  CKTOTAL 140 139  CKMB 3.1 3.6  CKMBINDEX -- --  TROPONINI <0.30 <0.30   No components found with this basename: POCBNP:3 No results found for this basename: DDIMER:2 in the last 72 hours No results found for this basename: HGBA1C:2 in the last 72 hours No results found for this basename: CHOL:2,HDL:2,LDLCALC:2,TRIG:2,CHOLHDL:2,LDLDIRECT:2 in the last 72 hours  Basename 04/29/11 0128  TSH 0.245*  T4TOTAL --  T3FREE --  THYROIDAB --   No results found for this basename: VITAMINB12:2,FOLATE:2,FERRITIN:2,TIBC:2,IRON:2,RETICCTPCT:2 in the last 72 hours  Micro Results: Recent Results (from the past 240 hour(s))  URINE CULTURE     Status: Normal   Collection Time   04/28/11 10:41 PM      Component Value Range Status Comment   Specimen Description URINE, CLEAN CATCH   Final  Special Requests NONE   Final    Setup Time [id number]   Final    Colony Count 20,OOO COLONIES/ML   Final    Culture ESCHERICHIA COLI   Final    Report Status 05/01/2011 FINAL   Final    Organism ID, Bacteria ESCHERICHIA COLI   Final   MRSA PCR SCREENING     Status: Normal   Collection Time   04/29/11  2:40 PM      Component Value Range Status Comment   MRSA by PCR NEGATIVE  NEGATIVE  Final   CULTURE, RESPIRATORY     Status: Normal (Preliminary result)   Collection Time   04/30/11  6:03 PM      Component Value Range Status Comment   Specimen Description  SPUTUM   Final    Special Requests NONE   Final    Gram Stain PENDING   Incomplete    Culture Culture reincubated for better growth   Final    Report Status PENDING   Incomplete     Studies/Results: Dg Chest 2 View  04/26/2011  *RADIOLOGY REPORT*  Clinical Data: Shortness of breath.  Wheezing.  CHEST - 2 VIEW  Comparison: 01/23/2011  Findings: Heart size is normal.  The aorta is unfolded.  There is bronchial thickening but no infiltrate, collapse or effusion.  Mild degenerative changes effect the spine.  IMPRESSION: Bronchial thickening without consolidation or collapse.  Original Report Authenticated By: Thomasenia Sales, M.D.   Ct Angio Chest W/cm &/or Wo Cm  04/28/2011  *RADIOLOGY REPORT*  Clinical Data:  Short of breath  CT ANGIOGRAPHY CHEST WITH CONTRAST  Technique:  Multidetector CT imaging of the chest was performed using the standard protocol during bolus administration of intravenous contrast.  Multiplanar CT image reconstructions including MIPs were obtained to evaluate the vascular anatomy.  Contrast: OMNIPAQUE IOHEXOL 300 MG/ML IV SOLN  Comparison:  None  Findings:  There are no filling defects in the pulmonary arterial tree to suggest acute pulmonary thromboembolism.  2.7 x 1.3 cm right hilar lymph node mass.  Borderline enlarged mediastinal adenopathy is scattered.  12 mm precarinal node on image 32.  Small prevascular nodes.  7 mm node at the thoracic inlet on the right on image 9.  Small bilateral pleural effusions  Dependent atelectasis bilaterally. Mild diffuse bronchiectasis and interlobular septal thickening.  This is nonspecific.  Review of the MIP images confirms the above findings.  IMPRESSION: No evidence of acute pulmonary thromboembolism.  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  Small bilateral pleural effusions.  Abnormal right hilar and mediastinal adenopathy.  Inflammatory and neoplastic etiology are  considerations.  At the minimum, 3- 6 months follow-up is recommended to ensure resolution.  Original Report Authenticated By: Donavan Burnet, M.D.    Medications: I have reviewed the patient's current medications. Scheduled Meds:    . aspirin EC  81 mg Oral QHS  . atorvastatin  10 mg Oral q1800  . bisoprolol  5 mg Oral Daily  . calcium carbonate  1 tablet Oral Daily  . cholecalciferol  1,200 Units Oral Daily  . enoxaparin  40 mg Subcutaneous Q24H  . hydrALAZINE  12.5 mg Oral TID  . isosorbide dinitrate  15 mg Oral TID  . levofloxacin (LEVAQUIN) IV  750 mg Intravenous Q24H  . mometasone  1 spray Each Nare Daily  . pantoprazole  40 mg Oral Q1200  . potassium chloride  10 mEq Oral BID  .  sodium chloride  3 mL Intravenous Q12H  . vitamin C  500 mg Oral Daily   Continuous Infusions:    . sodium chloride 50 mL/hr at 04/30/11 1600   PRN Meds:.sodium chloride, acetaminophen, ALPRAZolam, calcium carbonate, HYDROcodone-acetaminophen, levalbuterol, magic mouthwash, ondansetron (ZOFRAN) IV, sodium chloride, zolpidem Assessment/Plan: Patient Active Hospital Problem List: Acute on chronic systolic congestive heart failure (02/24/2011)   Assessment: The patient has responded well to Lasix in regard to her respiratory status. However the Lasix appears to have caused her to have some hypotension. I will continue to hold the Lasix and the ACE inhibitor at present. The patient has been gently hydrated and I will stop any further hydration and assess the patient's fluid balance on tomorrow. I've also asked about cardiology who she sees as an outpatient to see her in consultation.    Hypotension (04/30/2011)   Assessment: Felt to be a combination of her diuresis as well as her antihypertensive. I will hold her diuretics and her lisinopril and gently rehydrate her at present.     Anxiety attack (08/18/2010)   Assessment: Patient much, less anxious today. This patient responds very well to being able to  manage her expectations that she knows what they are. She is felt that with regard to her cough and wheezing she has not know what to expect and this makes her quite anxious. The patient also has a lot of anxiety associated with the ingestion of medications and prefers only to take her medications from home.     Bronchiectasis (04/30/2011)   Assessment: Patient has bronchiectatic changes on the CT scan. She denies a history of bronchiectasis however she is having an odd dark-colored sputum. August 2 culture and start her on Levaquin. Also consult pulmonology to see her tomorrow since she is under the care of pulmonologist an ongoing basis. I do believe that the patient may have a component of acute bronchitis which may benefit from some steroids however I will defer to the pulmonologist after they have seen her since the patient is oxygenating well at this time.     ABDOMINAL PAIN (11/22/2006)   Assessment: The patient's abdominal pain is improved. It seems to be most prominent when she is coughing in the left lower chest wall.    Shortness of breath (04/03/2011)   Assessment: Markedly improved with diuresis.    Tachycardia (04/28/2011)   Assessment: The tachycardia is markedly improved. I feel that it was multifactorial owing to her anxiety is well as the effect of albuterol. The patient is actually being treated with Xopenex and this appears to be working well for    Hypokalemia (04/30/2011)   Assessment: Replete orally     Mediastinal lymphadenopathy (04/30/2011)   Assessment: Mediastinal lymphadenopathy is concerning however it is not clear as to whether or not this is reactive nonreactive. I will go ahead and treat her with Levaquin and she should have followup CT scans to evaluate this at a later date. I will discuss this with pulmonology.    MATTHEWS,MICHELLE A. PAGER # [phone]    LOS: 3 days

## 2011-05-02 ENCOUNTER — Inpatient Hospital Stay (HOSPITAL_COMMUNITY): Payer: Medicare Other

## 2011-05-02 DIAGNOSIS — R0602 Shortness of breath: Secondary | ICD-10-CM

## 2011-05-02 DIAGNOSIS — I059 Rheumatic mitral valve disease, unspecified: Secondary | ICD-10-CM

## 2011-05-02 LAB — BASIC METABOLIC PANEL
BUN: 21 mg/dL (ref 6–23)
CO2: 24 mEq/L (ref 19–32)
Calcium: 10.7 mg/dL — ABNORMAL HIGH (ref 8.4–10.5)
Chloride: 103 mEq/L (ref 96–112)
Creatinine, Ser: 0.86 mg/dL (ref 0.50–1.10)
GFR calc Af Amer: 80 mL/min — ABNORMAL LOW (ref >90)
GFR calc non Af Amer: 69 mL/min — ABNORMAL LOW (ref >90)
Glucose, Bld: 158 mg/dL — ABNORMAL HIGH (ref 70–99)
Potassium: 5.5 mEq/L — ABNORMAL HIGH (ref 3.5–5.1)
Sodium: 135 mEq/L (ref 135–145)

## 2011-05-02 NOTE — Progress Notes (Signed)
Speech Language/Pathology  [phone]  Order for swallow evaluation received to r/o occult aspiration.  Recommend proceed with instrumental evaluation to assess swallow, order received for MBS from Dr Ashley Royalty.   SLP spoke to pt who reports no s/s of dysphagia, pt on omeprazole PTA.  Pt acknowledges s/s of reflux approximately 4-5 times a year only.  Acknowledges wheeze after meals, but no overt coughing/choking.  Pt also expressed concern regarding h/o diverticulosis and barium administration.  SLP advised will limit to minimal amount of barium for evaluation.  Pt has been seen by a speech pathologist for VCD approximately 2-3 years ago and reports this was not helpful.  Second-hand smoke exposure for 23 years per family.   Local speech pathologist who treats patients with known VCD is Verdie Mosher at [facility],  Phone [phone] per Dr Ashley Royalty request.     Dollene Cleveland, MS [facility] SLP [phone]

## 2011-05-02 NOTE — Procedures (Signed)
Modified Barium Swallow Procedure Note Patient Details  Name: Ashley [name] MRN: [id number] Date of Birth: Mar 02, 1946  Today's Date: 05/02/2011 [id number]  - 0929    Past Medical History:  Past Medical History  Diagnosis Date  . Lump or mass in breast   . Carpal tunnel syndrome   . Migraine without aura, without mention of intractable migraine without mention of status migrainosus   . Diarrhea   . Diverticulosis of colon     no diverticulitis  . Fibromyalgia   . Pure hypercholesterolemia   . Irritable bowel syndrome   . Laryngeal nodule     Dr. Thelma Comp  . Fibrocystic breast disease   . Osteoarthritis   . Genital herpes   . Hemorrhoids   . Anxiety   . GERD (gastroesophageal reflux disease)   . Systolic CHF     EF 25-35%, LV dilation, significant MR  . Nonischemic cardiomyopathy   . Pneumonia   . Mitral regurgitation   . Anxiety   . Pleural effusion, bilateral 2012   Past Surgical History:  Past Surgical History  Procedure Date  . Appendectomy   . Mandible surgery   . Knee arthroscopy w/ orif     Left  . Osteotomy     Left foot  . 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 Total abdominal hysterectomy w/ bilateral salpingoophorectomy 1987    Endometriosis  . Dexa 03/28/99    osteopenia L/S  . Colonoscopy 1998    nml (sigmoid-Gilbert-nol)  . Head mri, head ct 10/1998    ? H/A focus ([facility])  . Emg/mcv 10/16/01    + mild carpal tunnel  . Dexa 03/12/03    2.2 Spine 0.1 Hip  Osteopenia  . Mri right hip 12/12/01    Tendonitis Gluteus Medius to Gtr Trochanter Neg Bursitis (Dr. Montez Morita)  . L/s films 11/21/01    nml; Right hip nml  . Mri u/s 11/21/01    min. DDD L/3-4  . Colonoscopy 01/04/04    polyps, diverticulosis/severe  . Dexa 11/14/05    1.9 Spine 0.3 Hip  slight improvement  . Flex laryngoscopy 06/11/06    (Juengel) nml  . Dexa 11/22/06    improved osteo and fem neck  . [place] bunionectomy 11/04/08    Right Great Toe (Dr. Lestine Box)  . Breast mass excision  01/2010    Left-fibrocystic change w/intraductal papilloma, no malignancy  . Ct chest     12/2010 - possible bronchiectasis lower lobes, 02/2011 - enlarged bilateral effusions, bibasilar atx  . Stress myoview 02/2011    no significant ischemia    Recommendation/Prognosis  Clinical Impression Dysphagia Diagnosis: Within Functional Limits Clinical impression: Limited barium given secondary to pt's issues with diverticulosis and functional swallow observed with thin, pudding, and cracker.   Barium tablet not given secondary to known GI issues.  Timely swallow without significant residuals.  Given h/o GERD, recommend pt continue reflux prudent diet with general aspiration precautions.  Pt educated and agreeable.  No SLP follow up needed.     Recommendations Solid Consistency: Regular Liquid Consistency: Thin Medication Administration: Other (Comment) (defer to pt) Supervision: Patient able to self feed Compensations: Slow rate;Small sips/bites (drink liquids throughout meal) Oral Care Recommendations: Oral care BID Follow up Recommendations: None    Individuals Consulted Consulted and Agree with Results and Recommendations: Patient  SLP Assessment/Plan  No SLP follow up indicated.   General:  Date of Onset: 05/02/11 HPI (Other Pertinent Information): 65 year old female  admitted to [facility] 04/28/11 diagnosed with viral cardiomyopathy, allergic rhinitis, bronchietasis.  PMH + for GERD, fibrocystic breast disease, pna, anxiety, pleural effusion, CHF ejection fraction 25-35%, OEA, laryngeal nodule, diarrhea, second-hand smoke exposure x23 years, diverticulosis, migraines, jaw surgery 1988.  Pt has been diagnosed with VCD in the past with SLP tx 2-3 years ago without significant improvement per her statement.  Pt takes omeprazole BID and xanax. Order received for swallow eval to r/o occult aspiration, MBS ordered obtained.   Type of Study: Initial MBS Diet Prior to this Study: Regular;Thin  liquids Respiratory Status: Supplemental O2 delivered via (comment) Behavior/Cognition: Alert Oral Cavity - Dentition: Adequate natural dentition Oral Motor / Sensory Function: Within functional limits Patient Positioning: Postural control adequate for testing Baseline Vocal Quality: Normal Volitional Cough: Strong Volitional Swallow: Able to elicit Anatomy: Within functional limits Pharyngeal Secretions: Not observed secondary MBS Ice chips: Not tested  Reason for Referral: assess for occult aspiration  Oral Phase Oral Preparation/Oral Phase Oral Phase: WFL Pharyngeal Phase  Pharyngeal Phase Pharyngeal Phase: Within functional limits Cervical Esophageal Phase  Cervical Esophageal Phase Cervical Esophageal Phase: WFL (trace cracker residuals, mid-esop, no sensation,liquid clear)    Ashley [name] 05/02/2011, 9:43 AM

## 2011-05-02 NOTE — Progress Notes (Signed)
  Echocardiogram 2D Echocardiogram has been performed.  Juanita Laster Azlee Monforte, RDCS 05/02/2011, 9:35 AM

## 2011-05-02 NOTE — Progress Notes (Signed)
Subjective: Patient states that she is feeling much better today. She is sitting up on the window seat and her work of breathing is markedly decreased as is her cough and wheezing. Patient continues to request oxygen at home. I discussed on multiple occasions with the patient that she qualified for oxygen it will certainly be prescribed for her.     Filed Vitals:   05/01/11 2340 05/02/11 0528 05/02/11 1000 05/02/11 1500  BP:  117/76 117/82 107/70  Pulse:  81  78  Temp:  97.1 F (36.2 C)  98.4 F (36.9 C)  TempSrc:  Oral  Oral  Resp:  20  20  Height:      Weight:  70.4 kg (155 lb 3.3 oz)    SpO2: 96% 99%  99%   Weight change: 0.319 kg (11.3 oz)  Intake/Output Summary (Last 24 hours) at 05/02/11 1809 Last data filed at 05/02/11 1610  Gross per 24 hour  Intake    240 ml  Output   1050 ml  Net   -810 ml    General: Alert, awake, oriented x3, in no acute distress.  HEENT: [place]/AT PEERL, EOMI Neck: Trachea midline,  no masses, no thyromegal,y no JVD, no carotid bruit, no stridor. OROPHARYNX:  Moist, No exudate/ erythema/lesions.  Heart: Regular rate and rhythm, without murmurs, rubs, gallops, PMI non-displaced, no heaves or thrills on palpation.  Lungs: Clear to auscultation, no wheezing or rhonchi noted. No increased vocal fremitus resonant to percussion  Abdomen: Soft, nontender, nondistended, positive bowel sounds, no masses no hepatosplenomegaly noted..  Neuro: No focal neurological deficits noted cranial nerves II through XII grossly intact. DTRs 2+ bilaterally upper and lower extremities. Strength functional in bilateral upper and lower extremities. Musculoskeletal: No warm swelling or erythema around joints, no spinal tenderness noted. Psychiatric: Patient alert and oriented x3, good insight and cognition, good recent to remote recall. Lymph node survey: No cervical axillary or inguinal lymphadenopathy noted.     Lab Results:  Basename 05/02/11 0450 05/01/11 0440  NA 135  136  K 5.5* 4.7  CL 103 102  CO2 24 24  GLUCOSE 158* 98  BUN 21 28*  CREATININE 0.86 1.00  CALCIUM 10.7* 9.9  MG -- --  PHOS -- --   No results found for this basename: AST:2,ALT:2,ALKPHOS:2,BILITOT:2,PROT:2,ALBUMIN:2 in the last 72 hours No results found for this basename: LIPASE:2,AMYLASE:2 in the last 72 hours  Basename 04/29/11 2345  WBC 9.6  NEUTROABS --  HGB 10.4*  HCT 32.1*  MCV 88.2  PLT 279    Basename 04/29/11 2345  CKTOTAL 140  CKMB 3.1  CKMBINDEX --  TROPONINI <0.30   No components found with this basename: POCBNP:3 No results found for this basename: DDIMER:2 in the last 72 hours No results found for this basename: HGBA1C:2 in the last 72 hours No results found for this basename: CHOL:2,HDL:2,LDLCALC:2,TRIG:2,CHOLHDL:2,LDLDIRECT:2 in the last 72 hours No results found for this basename: TSH,T4TOTAL,FREET3,T3FREE,THYROIDAB in the last 72 hours No results found for this basename: VITAMINB12:2,FOLATE:2,FERRITIN:2,TIBC:2,IRON:2,RETICCTPCT:2 in the last 72 hours  Micro Results: Recent Results (from the past 240 hour(s))  URINE CULTURE     Status: Normal   Collection Time   04/28/11 10:41 PM      Component Value Range Status Comment   Specimen Description URINE, CLEAN CATCH   Final    Special Requests NONE   Final    Setup Time [id number]   Final    Colony Count 20,OOO COLONIES/ML   Final  Culture ESCHERICHIA COLI   Final    Report Status 05/01/2011 FINAL   Final    Organism ID, Bacteria ESCHERICHIA COLI   Final   MRSA PCR SCREENING     Status: Normal   Collection Time   04/29/11  2:40 PM      Component Value Range Status Comment   MRSA by PCR NEGATIVE  NEGATIVE  Final   CULTURE, RESPIRATORY     Status: Normal (Preliminary result)   Collection Time   04/30/11  6:03 PM      Component Value Range Status Comment   Specimen Description SPUTUM   Final    Special Requests NONE   Final    Gram Stain     Final    Value: NO WBC SEEN     RARE SQUAMOUS  EPITHELIAL CELLS PRESENT     MODERATE GRAM POSITIVE COCCI     IN PAIRS IN CLUSTERS   Culture NORMAL OROPHARYNGEAL FLORA   Final    Report Status PENDING   Incomplete     Studies/Results: Dg Chest 2 View  04/26/2011  *RADIOLOGY REPORT*  Clinical Data: Shortness of breath.  Wheezing.  CHEST - 2 VIEW  Comparison: 01/23/2011  Findings: Heart size is normal.  The aorta is unfolded.  There is bronchial thickening but no infiltrate, collapse or effusion.  Mild degenerative changes effect the spine.  IMPRESSION: Bronchial thickening without consolidation or collapse.  Original Report Authenticated By: Thomasenia Sales, M.D.   Ct Angio Chest W/cm &/or Wo Cm  04/28/2011  *RADIOLOGY REPORT*  Clinical Data:  Short of breath  CT ANGIOGRAPHY CHEST WITH CONTRAST  Technique:  Multidetector CT imaging of the chest was performed using the standard protocol during bolus administration of intravenous contrast.  Multiplanar CT image reconstructions including MIPs were obtained to evaluate the vascular anatomy.  Contrast: OMNIPAQUE IOHEXOL 300 MG/ML IV SOLN  Comparison:  None  Findings:  There are no filling defects in the pulmonary arterial tree to suggest acute pulmonary thromboembolism.  2.7 x 1.3 cm right hilar lymph node mass.  Borderline enlarged mediastinal adenopathy is scattered.  12 mm precarinal node on image 32.  Small prevascular nodes.  7 mm node at the thoracic inlet on the right on image 9.  Small bilateral pleural effusions  Dependent atelectasis bilaterally. Mild diffuse bronchiectasis and interlobular septal thickening.  This is nonspecific.  Review of the MIP images confirms the above findings.  IMPRESSION: No evidence of acute pulmonary thromboembolism.  Bronchiectasis and interstitial lung disease likely related to [name] chronic inflammatory process.  Small bilateral pleural effusions.  Abnormal right hilar and mediastinal adenopathy.  Inflammatory and neoplastic etiology are considerations.  At the  minimum, 3- 6 months follow-up is recommended to ensure resolution.  Original Report Authenticated By: Donavan Burnet, M.D.    Medications: I have reviewed the patient's current medications. Scheduled Meds:    . aspirin EC  81 mg Oral QHS  . atorvastatin  10 mg Oral q1800  . bisoprolol  5 mg Oral Daily  . calcium carbonate  1 tablet Oral Daily  . cholecalciferol  1,200 Units Oral Daily  . enoxaparin  40 mg Subcutaneous Q24H  . hydrALAZINE  12.5 mg Oral TID  . isosorbide dinitrate  15 mg Oral TID  . levofloxacin (LEVAQUIN) IV  750 mg Intravenous Q24H  . methylPREDNISolone (SOLU-MEDROL) injection  60 mg Intravenous Q12H  . mometasone  1 spray Each Nare Daily  . omeprazole  40 mg  Oral BID AC  . vitamin C  500 mg Oral Daily  . DISCONTD: NON FORMULARY 40 mg  40 mg Oral Q12H  . DISCONTD: pantoprazole  40 mg Oral Q1200  . DISCONTD: potassium chloride  10 mEq Oral BID  . DISCONTD: sodium chloride  3 mL Intravenous Q12H   Continuous Infusions:   PRN Meds:.acetaminophen, ALPRAZolam, calcium carbonate, HYDROcodone-acetaminophen, levalbuterol, magic mouthwash, ondansetron (ZOFRAN) IV, zolpidem, DISCONTD: sodium chloride, DISCONTD: sodium chloride Assessment/Plan: Patient Active Hospital Problem List: Acute on chronic systolic congestive heart failure (02/24/2011)   Assessment: The patient has responded well to Lasix in regard to her respiratory status. However the Lasix appears to have caused her to have some hypotension. I will continue to hold the Lasix and the ACE inhibitor at present. The patient has been gently hydrated and I will stop any further hydration and assess the patient's fluid balance on tomorrow. I will defer to cardiology about the timing of resuming her Lasix.    Hypotension (04/30/2011)   Assessment: Felt to be [name] combination of her diuresis as well as her antihypertensive. I will hold her diuretics and her lisinopril and gently rehydrate her at present.     Anxiety attack  (08/18/2010)   Assessment: Patient much, less anxious today. This patient responds very well to being able to manage her expectations that she knows what they are. She is felt that with regard to her cough and wheezing she has not know what to expect and this makes her quite anxious. The patient also has [name] lot of anxiety associated with the ingestion of medications and prefers only to take her medications from home.     Bronchiectasis (04/30/2011)   Assessment: She pulmonology and put on the CT changes the patient was started on steroids I suggested and she appears to have significant improvement on this.her modified barium swallow study showed no evidence of aspiration. Planus I will continue the steroids and her Levaquin which is day #2 now     ABDOMINAL PAIN (11/22/2006)   Assessment: The patient's abdominal pain is improved. It seems to be most prominent when she is coughing in the left lower chest wall.    Shortness of breath (04/03/2011)   Assessment: Markedly improved with diuresis and steroids.    Tachycardia (04/28/2011)   Assessment: The tachycardia is markedly improved. I feel that it was multifactorial owing to her anxiety is well as the effect of albuterol. The patient is actually being treated with Xopenex and this appears to be working well for    Hyperkalemia (04/30/2011)   Assessment: Potassium being held at present     Mediastinal lymphadenopathy (04/30/2011)   Assessment: Mediastinal lymphadenopathy is concerning however it is not clear as to whether or not this is reactive nonreactive. I will go ahead and treat her with Levaquin and she should have followup CT scans to evaluate this at [name] later date. I will discuss this with pulmonology.    Ashley [name]. PAGER # [phone]    LOS: 4 days

## 2011-05-02 NOTE — Progress Notes (Signed)
   SUBJECTIVE:  Coughing last night.  Cannot sleep in the bed because of allergies to the sheets.     PHYSICAL EXAM Filed Vitals:   05/01/11 1450 05/01/11 2131 05/01/11 2340 05/02/11 0528  BP:  96/68  117/76  Pulse:  95  81  Temp:  98 F (36.7 C)  97.1 F (36.2 C)  TempSrc:  Oral  Oral  Resp:  18  20  Height:      Weight:    70.4 kg (155 lb 3.3 oz)  SpO2: 98% 98% 96% 99%   General:  Coughing.  Looks fatigued Lungs:  No crackles Heart:  RRR, no murmur Abdomen:  Positive bowel sounds, no rebound no guarding Extremities:  No edema.  LABS: Lab Results  Component Value Date   CKTOTAL 140 04/29/2011   CKMB 3.1 04/29/2011   TROPONINI <0.30 04/29/2011   Results for orders placed during the hospital encounter of 04/28/11 (from the past 24 hour(s))  BASIC METABOLIC PANEL     Status: Abnormal   Collection Time   05/02/11  4:50 AM      Component Value Range   Sodium 135  135 - 145 (mEq/L)   Potassium 5.5 (*) 3.5 - 5.1 (mEq/L)   Chloride 103  96 - 112 (mEq/L)   CO2 24  19 - 32 (mEq/L)   Glucose, Bld 158 (*) 70 - 99 (mg/dL)   BUN 21  6 - 23 (mg/dL)   Creatinine, Ser 4.09  0.50 - 1.10 (mg/dL)   Calcium 81.1 (*) 8.4 - 10.5 (mg/dL)   GFR calc non Af Amer 69 (*) >90 (mL/min)   GFR calc Af Amer 80 (*) >90 (mL/min)    Intake/Output Summary (Last 24 hours) at 05/02/11 9147 Last data filed at 05/02/11 8295  Gross per 24 hour  Intake    120 ml  Output   1050 ml  Net   -930 ml   ECHO:  Pending  ASSESSMENT AND PLAN:   1) Acute on chronic systolic congestive heart failure:  I/O negative.  Holding Lasix secondary to hypotension.  Repeat proBNP in am.  Of note she would like to have home O2 when discharged.    2) Tachycardia:  Telemetry reviewed.  Heart rate well controlled.      3) Hypotension:  As above.  BPs low but stable       Rollene Rotunda 05/02/2011 7:22 AM

## 2011-05-03 DIAGNOSIS — I5023 Acute on chronic systolic (congestive) heart failure: Principal | ICD-10-CM

## 2011-05-03 LAB — BASIC METABOLIC PANEL
BUN: 25 mg/dL — ABNORMAL HIGH (ref 6–23)
CO2: 23 mEq/L (ref 19–32)
Calcium: 10.2 mg/dL (ref 8.4–10.5)
Chloride: 101 mEq/L (ref 96–112)
Creatinine, Ser: 0.94 mg/dL (ref 0.50–1.10)
GFR calc Af Amer: 72 mL/min — ABNORMAL LOW (ref >90)
GFR calc non Af Amer: 62 mL/min — ABNORMAL LOW (ref >90)
Glucose, Bld: 155 mg/dL — ABNORMAL HIGH (ref 70–99)
Potassium: 4.7 mEq/L (ref 3.5–5.1)
Sodium: 135 mEq/L (ref 135–145)

## 2011-05-03 LAB — CULTURE, RESPIRATORY W GRAM STAIN

## 2011-05-03 LAB — CULTURE, RESPIRATORY
Culture: NORMAL
Gram Stain: NONE SEEN

## 2011-05-03 LAB — PRO B NATRIURETIC PEPTIDE: Pro B Natriuretic peptide (BNP): 7571 pg/mL — ABNORMAL HIGH (ref 0–125)

## 2011-05-03 MED ORDER — FUROSEMIDE 20 MG PO TABS
20.0000 mg | ORAL_TABLET | Freq: Every day | ORAL | Status: DC
  Administered 2011-05-03 – 2011-05-04 (×2): 20 mg via ORAL
  Filled 2011-05-03 (×3): qty 1

## 2011-05-03 NOTE — Progress Notes (Signed)
Pt has orders to be on continuous pulse oximetry and to monitor pulse oxygen during various activities.  While sleeping during the night, night-shift RN reported oxygen saturation dropping into the 80's on 1 liter. While awake during the morning pt remained 99% while lying, sitting and ambulating on 0.5 liters.  Will continue to wean off of oxygen and monitor saturation levels.  Ashley [name] 05/03/2011 10:01 AM

## 2011-05-03 NOTE — Progress Notes (Signed)
Ashley [name] CSN:[id number],MRN:[id number] is a 65 y.o. female,  Outpatient Primary MD for the patient is [name], MD, MD  Chief Complaint  Patient presents with  . Shortness of Breath        Subjective:   [facility] today has, No headache, No chest pain, No abdominal pain - No Nausea, No new weakness tingling or numbness, No Cough - still mild SOB but better.  Objective:   Filed Vitals:   05/03/11 0538 05/03/11 0539 05/03/11 0540 05/03/11 0944  BP: 109/71 117/76 114/76 118/72  Pulse: 80 89 95   Temp: 96.8 F (36 C)     TempSrc: Oral     Resp: 18     Height:      Weight: 70.3 kg (154 lb 15.7 oz)     SpO2: 97%       Wt Readings from Last 3 Encounters:  05/03/11 70.3 kg (154 lb 15.7 oz)  04/26/11 71.668 kg (158 lb)  04/03/11 70.67 kg (155 lb 12.8 oz)     Intake/Output Summary (Last 24 hours) at 05/03/11 1433 Last data filed at 05/03/11 0900  Gross per 24 hour  Intake    480 ml  Output    500 ml  Net    -20 ml    Exam Awake Alert, Oriented *3, No new F.N deficits, Normal affect [place].AT,PERRAL Supple Neck,No JVD, No cervical lymphadenopathy appriciated.  Symmetrical Chest wall movement, Good air movement bilaterally, Bibasialr rales RRR,No Gallops,Rubs or new Murmurs, No Parasternal Heave +ve B.Sounds, Abd Soft, Non tender, No organomegaly appriciated, No rebound -guarding or rigidity. No Cyanosis, Clubbing or edema, No new Rash or bruise     Data Review  CBC  Lab 04/29/11 2345 04/29/11 1016 04/28/11 1345  WBC 9.6 19.9* 10.1  HGB 10.4* 13.4 11.6*  HCT 32.1* 40.2 35.7*  PLT 279 318 284  MCV 88.2 87.6 88.6  MCH 28.6 29.2 28.8  MCHC 32.4 33.3 32.5  RDW 14.1 13.9 14.0  LYMPHSABS -- -- --  MONOABS -- -- --  EOSABS -- -- --  BASOSABS -- -- --  BANDABS -- -- --    Chemistries   Lab 05/03/11 0442 05/02/11 0450 05/01/11 0440 04/29/11 2345 04/29/11 1016 04/29/11 0128 04/28/11 1345  NA 135 135 136 134* -- -- 138  K 4.7 5.5* 4.7 3.4* -- -- 4.2   CL 101 103 102 97 -- -- 102  CO2 23 24 24 27  -- -- 25  GLUCOSE 155* 158* 98 109* -- -- 140*  BUN 25* 21 28* 25* -- -- 15  CREATININE 0.94 0.86 1.00 1.08 0.94 -- --  CALCIUM 10.2 10.7* 9.9 9.1 -- -- 10.3  MG -- -- -- -- -- 2.3 --  AST -- -- -- -- -- -- --  ALT -- -- -- -- -- -- --  ALKPHOS -- -- -- -- -- -- --  BILITOT -- -- -- -- -- -- --   ------------------------------------------------------------------------------------------------------------------ estimated creatinine clearance is 58.7 ml/min (by C-G formula based on Cr of 0.94). ------------------------------------------------------------------------------------------------------------------ No results found for this basename: HGBA1C:2 in the last 72 hours ------------------------------------------------------------------------------------------------------------------ No results found for this basename: CHOL:2,HDL:2,LDLCALC:2,TRIG:2,CHOLHDL:2,LDLDIRECT:2 in the last 72 hours ------------------------------------------------------------------------------------------------------------------ No results found for this basename: TSH,T4TOTAL,FREET3,T3FREE,THYROIDAB in the last 72 hours ------------------------------------------------------------------------------------------------------------------ No results found for this basename: VITAMINB12:2,FOLATE:2,FERRITIN:2,TIBC:2,IRON:2,RETICCTPCT:2 in the last 72 hours  Coagulation profile No results found for this basename: INR:5,PROTIME:5 in the last 168 hours  No results found for this basename: DDIMER:2 in the last 72 hours  Cardiac Enzymes  Lab 04/29/11 2345 04/29/11 1552  CKMB 3.1 3.6  TROPONINI <0.30 <0.30  MYOGLOBIN -- --   ------------------------------------------------------------------------------------------------------------------ No components found with this basename: POCBNP:3  Micro Results Recent Results (from the past 240 hour(s))  URINE CULTURE     Status:  Normal   Collection Time   04/28/11 10:41 PM      Component Value Range Status Comment   Specimen Description URINE, CLEAN CATCH   Final    Special Requests NONE   Final    Setup Time [id number]   Final    Colony Count 20,OOO COLONIES/ML   Final    Culture ESCHERICHIA COLI   Final    Report Status 05/01/2011 FINAL   Final    Organism ID, Bacteria ESCHERICHIA COLI   Final   MRSA PCR SCREENING     Status: Normal   Collection Time   04/29/11  2:40 PM      Component Value Range Status Comment   MRSA by PCR NEGATIVE  NEGATIVE  Final   CULTURE, RESPIRATORY     Status: Normal   Collection Time   04/30/11  6:03 PM      Component Value Range Status Comment   Specimen Description SPUTUM   Final    Special Requests NONE   Final    Gram Stain     Final    Value: NO WBC SEEN     RARE SQUAMOUS EPITHELIAL CELLS PRESENT     MODERATE GRAM POSITIVE COCCI     IN PAIRS IN CLUSTERS   Culture NORMAL OROPHARYNGEAL FLORA   Final    Report Status 05/03/2011 FINAL   Final     Radiology Reports Dg Chest 2 View  05/01/2011  *RADIOLOGY REPORT*  Clinical Data: Cough.  History of CHF.  Shortness of breath.  CHEST - 2 VIEW  Comparison: 04/26/2011  Findings: There is cardiomegaly.  Peribronchial thickening.  No overt edema.  Left basilar opacity, likely atelectasis.  Right lung is clear.  Small effusions on the lateral view.  No acute bony abnormality.  IMPRESSION: Cardiomegaly.  Left lower lobe opacity, likely atelectasis.  Small effusions.  Original Report Authenticated By: Cyndie Chime, M.D.   Dg Chest 2 View  04/26/2011  *RADIOLOGY REPORT*  Clinical Data: Shortness of breath.  Wheezing.  CHEST - 2 VIEW  Comparison: 01/23/2011  Findings: Heart size is normal.  The aorta is unfolded.  There is bronchial thickening but no infiltrate, collapse or effusion.  Mild degenerative changes effect the spine.  IMPRESSION: Bronchial thickening without consolidation or collapse.  Original Report Authenticated By: Thomasenia Sales, M.D.   Ct Angio Chest W/cm &/or Wo Cm  04/28/2011  *RADIOLOGY REPORT*  Clinical Data:  Short of breath  CT ANGIOGRAPHY CHEST WITH CONTRAST  Technique:  Multidetector CT imaging of the chest was performed using the standard protocol during bolus administration of intravenous contrast.  Multiplanar CT image reconstructions including MIPs were obtained to evaluate the vascular anatomy.  Contrast: OMNIPAQUE IOHEXOL 300 MG/ML IV SOLN  Comparison:  None  Findings:  There are no filling defects in the pulmonary arterial tree to suggest acute pulmonary thromboembolism.  2.7 x 1.3 cm right hilar lymph node mass.  Borderline enlarged mediastinal adenopathy is scattered.  12 mm precarinal node on image 32.  Small prevascular nodes.  7 mm node at the thoracic inlet on the right on image 9.  Small bilateral pleural effusions  Dependent atelectasis bilaterally. Mild diffuse bronchiectasis  and interlobular septal thickening.  This is nonspecific.  Review of the MIP images confirms the above findings.  IMPRESSION: No evidence of acute pulmonary thromboembolism.  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  Small bilateral pleural effusions.  Abnormal right hilar and mediastinal adenopathy.  Inflammatory and neoplastic etiology are considerations.  At the minimum, 3- 6 months follow-up is recommended to ensure resolution.  Original Report Authenticated By: Donavan Burnet, M.D.   Dg Swallowing Func-no Report  05/02/2011  CLINICAL DATA: to assess for occult aspiration per Dr Kendrick Fries, approved  for MBS by Dr Ashley Royalty   FLUOROSCOPY FOR SWALLOWING FUNCTION STUDY:  Fluoroscopy was provided for swallowing function study, which was  administered by a speech pathologist.  Final results and recommendations  from this study are contained within the speech pathology report.       ECHO:  -Left ventricle: The cavity size was mildly dilated. Wall thickness was normal.  Systolic function was severely reduced. The estimated ejection fraction was in the range of 25% to 30%. Diffuse hypokinesis. Doppler parameters are consistent with restrictive physiology, indicative of decreased left ventricular diastolic compliance and/or increased left atrial pressure. - Aortic valve: There was no stenosis. - Mitral valve: Moderate regurgitation, likely due to annular dilatation with dilated LV.    Scheduled Meds:   . aspirin EC  81 mg Oral QHS  . atorvastatin  10 mg Oral q1800  . bisoprolol  5 mg Oral Daily  . calcium carbonate  1 tablet Oral Daily  . cholecalciferol  1,200 Units Oral Daily  . enoxaparin  40 mg Subcutaneous Q24H  . furosemide  20 mg Oral Daily  . hydrALAZINE  12.5 mg Oral TID  . isosorbide dinitrate  15 mg Oral TID  . levofloxacin (LEVAQUIN) IV  750 mg Intravenous Q24H  . methylPREDNISolone (SOLU-MEDROL) injection  60 mg Intravenous Q12H  . mometasone  1 spray Each Nare Daily  . omeprazole  40 mg Oral BID AC  . vitamin C  500 mg Oral Daily   Continuous Infusions:  PRN Meds:.acetaminophen, ALPRAZolam, calcium carbonate, HYDROcodone-acetaminophen, levalbuterol, magic mouthwash, ondansetron (ZOFRAN) IV, zolpidem  Assessment & Plan   Principal Problem:  *Acute on chronic systolic congestive heart failure Active Problems:  ABDOMINAL PAIN  Anxiety attack  Shortness of breath  Tachycardia  Hypokalemia  Hypotension  Bronchiectasis  Mediastinal lymphadenopathy  Acute bronchitis  Acute on chronic systolic congestive heart failure Ef 25% causing SOB - ? Viral Myocarditis - cards following, On B blocker and Lasix as tolerated by BP, ACE / ARB per Cards, note she has chronic cough. Looks close to compensated clinically.    Hypotension (04/30/2011) Resolved.Off IVF.   Anxiety attack (08/18/2010) Assessment: Patient much, less anxious today. This patient responds very well to being able to manage her expectations that she knows what they are. She is  felt that with regard to her cough and wheezing she has not know what to expect and this makes her quite anxious. The patient also has a lot of anxiety associated with the ingestion of medications and prefers only to take her medications from home.   Bronchiectasis (04/30/2011) Assessment: Being followed by pulmonology and put on the Steroids and Levaquin - wheezing resolved, some night time drop in Pox, ? OSA, continue O2, Pulm to see today.  Mediastinal lymphadenopathy (04/30/2011) Assessment: Mediastinal lymphadenopathy is concerning however it is not clear as to whether or not this is reactive nonreactive. I will go ahead and  treat her with Levaquin and she should have followup CT scans and outpt Pulm follow.  ABDOMINAL PAIN (11/22/2006) Assessment: The patient's abdominal pain is improved. It seems to be most prominent when she is coughing in the left lower chest wall. i.e musculoskeletal.   Tachycardia (04/28/2011) Assessment: The tachycardia is markedly improved. From combination of CHF-SOB,  anxiety is well as the effect of albuterol. The patient is actually being treated with Xopenex and this appears to be working well for   Hyperkalemia (04/30/2011) Assessment: Potassium being held at present , caution with ACE-ARB if started by Cards.   Husband updated bedside 05-03-11 ]  DVT Prophylaxis  Lovenox    See all Orders from today for further details     Leroy Sea M.D  05/03/2011, 2:33 PM  [facility] Office  [phone]

## 2011-05-03 NOTE — Progress Notes (Signed)
    SUBJECTIVE:  Breathing better though she had an episode of coughing last night.  PHYSICAL EXAM Filed Vitals:   05/02/11 2147 05/03/11 0538 05/03/11 0539 05/03/11 0540  BP: 111/73 109/71 117/76 114/76  Pulse:  80 89 95  Temp:  96.8 F (36 C)    TempSrc:  Oral    Resp:  18    Height:      Weight:  70.3 kg (154 lb 15.7 oz)    SpO2:  97%     General:  Coughing.  Looks fatigued Lungs:  No crackles Heart:  RRR, no murmur Abdomen:  Positive bowel sounds, no rebound no guarding Extremities:  No edema.  LABS: Lab Results  Component Value Date   CKTOTAL 140 04/29/2011   CKMB 3.1 04/29/2011   TROPONINI <0.30 04/29/2011   Results for orders placed during the hospital encounter of 04/28/11 (from the past 24 hour(s))  PRO B NATRIURETIC PEPTIDE     Status: Abnormal   Collection Time   05/03/11  4:42 AM      Component Value Range   Pro B Natriuretic peptide (BNP) 7571.0 (*) 0 - 125 (pg/mL)  BASIC METABOLIC PANEL     Status: Abnormal   Collection Time   05/03/11  4:42 AM      Component Value Range   Sodium 135  135 - 145 (mEq/L)   Potassium 4.7  3.5 - 5.1 (mEq/L)   Chloride 101  96 - 112 (mEq/L)   CO2 23  19 - 32 (mEq/L)   Glucose, Bld 155 (*) 70 - 99 (mg/dL)   BUN 25 (*) 6 - 23 (mg/dL)   Creatinine, Ser 1.19  0.50 - 1.10 (mg/dL)   Calcium 14.7  8.4 - 10.5 (mg/dL)   GFR calc non Af Amer 62 (*) >90 (mL/min)   GFR calc Af Amer 72 (*) >90 (mL/min)    Intake/Output Summary (Last 24 hours) at 05/03/11 0918 Last data filed at 05/03/11 0500  Gross per 24 hour  Intake    240 ml  Output    500 ml  Net   -260 ml   ECHO:  Left ventricle: The cavity size was mildly dilated. Wall thickness was normal. Systolic function was severely reduced. The estimated ejection fraction was in the range of 25% to 30%. Diffuse hypokinesis. Doppler parameters are consistent with restrictive physiology, indicative of decreased left ventricular diastolic compliance and/or increased left atrial  pressure. - Aortic valve: There was no stenosis. - Mitral valve: Moderate regurgitation, likely due to annular dilatation with dilated LV.  ASSESSMENT AND PLAN:   1) Acute on chronic systolic congestive heart failure:  I/O negative.   ProBNP elevated.  MR is unchanged.  I have reviewed all of the out patient records.  I see that Dr. Mariah Milling tried to use Bystolic but this was stopped because of possible asthma.  She is on bisoprolol.  I don't see that ACE was ever started.  But as I understand it this is avoided because of her cough.  I will start back a low dose of Lasix today    2) Tachycardia:  Telemetry reviewed.  Heart rate well controlled.       Fayrene Fearing [facility] 05/03/2011 9:18 AM

## 2011-05-04 DIAGNOSIS — I5031 Acute diastolic (congestive) heart failure: Secondary | ICD-10-CM

## 2011-05-04 DIAGNOSIS — R062 Wheezing: Secondary | ICD-10-CM

## 2011-05-04 DIAGNOSIS — I501 Left ventricular failure: Secondary | ICD-10-CM

## 2011-05-04 DIAGNOSIS — J479 Bronchiectasis, uncomplicated: Secondary | ICD-10-CM

## 2011-05-04 DIAGNOSIS — R059 Cough, unspecified: Secondary | ICD-10-CM

## 2011-05-04 DIAGNOSIS — R05 Cough: Secondary | ICD-10-CM

## 2011-05-04 LAB — BASIC METABOLIC PANEL
BUN: 35 mg/dL — ABNORMAL HIGH (ref 6–23)
CO2: 22 mEq/L (ref 19–32)
Calcium: 10.2 mg/dL (ref 8.4–10.5)
Chloride: 101 mEq/L (ref 96–112)
Creatinine, Ser: 1.03 mg/dL (ref 0.50–1.10)
GFR calc Af Amer: 65 mL/min — ABNORMAL LOW (ref >90)
GFR calc non Af Amer: 56 mL/min — ABNORMAL LOW (ref >90)
Glucose, Bld: 186 mg/dL — ABNORMAL HIGH (ref 70–99)
Potassium: 4.4 mEq/L (ref 3.5–5.1)
Sodium: 136 mEq/L (ref 135–145)

## 2011-05-04 LAB — MAGNESIUM: Magnesium: 2.5 mg/dL (ref 1.5–2.5)

## 2011-05-04 MED ORDER — MOMETASONE FUROATE 50 MCG/ACT NA SUSP
2.0000 | Freq: Two times a day (BID) | NASAL | Status: DC
  Administered 2011-05-04 – 2011-05-05 (×2): 2 via NASAL

## 2011-05-04 MED ORDER — OMEPRAZOLE 20 MG PO CPDR
40.0000 mg | DELAYED_RELEASE_CAPSULE | Freq: Two times a day (BID) | ORAL | Status: DC
  Administered 2011-05-04 – 2011-05-05 (×2): 40 mg via ORAL

## 2011-05-04 MED ORDER — OMEPRAZOLE 20 MG PO CPDR: 40.0000 mg | DELAYED_RELEASE_CAPSULE | Freq: Every day | ORAL | Status: DC

## 2011-05-04 MED ORDER — OMEPRAZOLE 20 MG PO CPDR: 40.0000 mg | DELAYED_RELEASE_CAPSULE | Freq: Two times a day (BID) | ORAL | Status: DC

## 2011-05-04 MED ORDER — PANTOPRAZOLE SODIUM 40 MG PO TBEC: 40.0000 mg | DELAYED_RELEASE_TABLET | Freq: Every day | ORAL | Status: DC

## 2011-05-04 NOTE — Progress Notes (Signed)
Ashley [name] CSN:[id number],MRN:[id number] is a 65 y.o. female,  Outpatient Primary MD for the patient is [name], MD, MD  Chief Complaint  Patient presents with  . Shortness of Breath        Subjective:   [facility] today has, No headache, No chest pain, No abdominal pain - No Nausea, No new weakness tingling or numbness, No Cough - still mild SOB but better.  Objective:   Filed Vitals:   05/04/11 0537 05/04/11 0539 05/04/11 0541 05/04/11 1006  BP: 102/64 116/75 116/75   Pulse: 82 80 82   Temp: 97.5 F (36.4 C)     TempSrc: Oral     Resp: 20     Height:      Weight: 70.353 kg (155 lb 1.6 oz)     SpO2: 94%   97%    Wt Readings from Last 3 Encounters:  05/04/11 70.353 kg (155 lb 1.6 oz)  04/26/11 71.668 kg (158 lb)  04/03/11 70.67 kg (155 lb 12.8 oz)     Intake/Output Summary (Last 24 hours) at 05/04/11 1436 Last data filed at 05/03/11 1900  Gross per 24 hour  Intake    480 ml  Output      0 ml  Net    480 ml    Exam Awake Alert, Oriented *3, No new F.N deficits, Normal affect [name],PERRAL Supple Neck,No JVD, No cervical lymphadenopathy appriciated.  Symmetrical Chest wall movement, Good air movement bilaterally, Bibasialr rales RRR,No Gallops,Rubs or new Murmurs, No Parasternal Heave +ve B.Sounds, Abd Soft, Non tender, No organomegaly appriciated, No rebound -guarding or rigidity. No Cyanosis, Clubbing or edema, No new Rash or bruise     Data Review  CBC  Lab 04/29/11 2345 04/29/11 1016 04/28/11 1345  WBC 9.6 19.9* 10.1  HGB 10.4* 13.4 11.6*  HCT 32.1* 40.2 35.7*  PLT 279 318 284  MCV 88.2 87.6 88.6  MCH 28.6 29.2 28.8  MCHC 32.4 33.3 32.5  RDW 14.1 13.9 14.0  LYMPHSABS -- -- --  MONOABS -- -- --  EOSABS -- -- --  BASOSABS -- -- --  BANDABS -- -- --    Chemistries   Lab 05/04/11 0437 05/03/11 0442 05/02/11 0450 05/01/11 0440 04/29/11 2345 04/29/11 0128  NA 136 135 135 136 134* --  K 4.4 4.7 5.5* 4.7 3.4* --  CL 101 101 103  102 97 --  CO2 22 23 24 24 27  --  GLUCOSE 186* 155* 158* 98 109* --  BUN 35* 25* 21 28* 25* --  CREATININE 1.03 0.94 0.86 1.00 1.08 --  CALCIUM 10.2 10.2 10.7* 9.9 9.1 --  MG 2.5 -- -- -- -- 2.3  AST -- -- -- -- -- --  ALT -- -- -- -- -- --  ALKPHOS -- -- -- -- -- --  BILITOT -- -- -- -- -- --   ------------------------------------------------------------------------------------------------------------------ estimated creatinine clearance is 53.6 ml/min (by C-G formula based on Cr of 1.03). ------------------------------------------------------------------------------------------------------------------ No results found for this basename: HGBA1C:2 in the last 72 hours ------------------------------------------------------------------------------------------------------------------ No results found for this basename: CHOL:2,HDL:2,LDLCALC:2,TRIG:2,CHOLHDL:2,LDLDIRECT:2 in the last 72 hours ------------------------------------------------------------------------------------------------------------------ No results found for this basename: TSH,T4TOTAL,FREET3,T3FREE,THYROIDAB in the last 72 hours ------------------------------------------------------------------------------------------------------------------ No results found for this basename: VITAMINB12:2,FOLATE:2,FERRITIN:2,TIBC:2,IRON:2,RETICCTPCT:2 in the last 72 hours  Coagulation profile No results found for this basename: INR:5,PROTIME:5 in the last 168 hours  No results found for this basename: DDIMER:2 in the last 72 hours  Cardiac Enzymes  Lab 04/29/11 2345 04/29/11 1552  CKMB 3.1 3.6  TROPONINI <0.30 <0.30  MYOGLOBIN -- --   ------------------------------------------------------------------------------------------------------------------ No components found with this basename: POCBNP:3  Micro Results Recent Results (from the past 240 hour(s))  URINE CULTURE     Status: Normal   Collection Time   04/28/11 10:41 PM       Component Value Range Status Comment   Specimen Description URINE, CLEAN CATCH   Final    Special Requests NONE   Final    Setup Time [id number]   Final    Colony Count 20,OOO COLONIES/ML   Final    Culture ESCHERICHIA COLI   Final    Report Status 05/01/2011 FINAL   Final    Organism ID, Bacteria ESCHERICHIA COLI   Final   MRSA PCR SCREENING     Status: Normal   Collection Time   04/29/11  2:40 PM      Component Value Range Status Comment   MRSA by PCR NEGATIVE  NEGATIVE  Final   CULTURE, RESPIRATORY     Status: Normal   Collection Time   04/30/11  6:03 PM      Component Value Range Status Comment   Specimen Description SPUTUM   Final    Special Requests NONE   Final    Gram Stain     Final    Value: NO WBC SEEN     RARE SQUAMOUS EPITHELIAL CELLS PRESENT     MODERATE GRAM POSITIVE COCCI     IN PAIRS IN CLUSTERS   Culture NORMAL OROPHARYNGEAL FLORA   Final    Report Status 05/03/2011 FINAL   Final     Radiology Reports Dg Chest 2 View  05/01/2011  *RADIOLOGY REPORT*  Clinical Data: Cough.  History of CHF.  Shortness of breath.  CHEST - 2 VIEW  Comparison: 04/26/2011  Findings: There is cardiomegaly.  Peribronchial thickening.  No overt edema.  Left basilar opacity, likely atelectasis.  Right lung is clear.  Small effusions on the lateral view.  No acute bony abnormality.  IMPRESSION: Cardiomegaly.  Left lower lobe opacity, likely atelectasis.  Small effusions.  Original Report Authenticated By: Cyndie Chime, M.D.   Dg Chest 2 View  04/26/2011  *RADIOLOGY REPORT*  Clinical Data: Shortness of breath.  Wheezing.  CHEST - 2 VIEW  Comparison: 01/23/2011  Findings: Heart size is normal.  The aorta is unfolded.  There is bronchial thickening but no infiltrate, collapse or effusion.  Mild degenerative changes effect the spine.  IMPRESSION: Bronchial thickening without consolidation or collapse.  Original Report Authenticated By: Thomasenia Sales, M.D.   Ct Angio Chest W/cm &/or Wo  Cm  04/28/2011  *RADIOLOGY REPORT*  Clinical Data:  Short of breath  CT ANGIOGRAPHY CHEST WITH CONTRAST  Technique:  Multidetector CT imaging of the chest was performed using the standard protocol during bolus administration of intravenous contrast.  Multiplanar CT image reconstructions including MIPs were obtained to evaluate the vascular anatomy.  Contrast: OMNIPAQUE IOHEXOL 300 MG/ML IV SOLN  Comparison:  None  Findings:  There are no filling defects in the pulmonary arterial tree to suggest acute pulmonary thromboembolism.  2.7 x 1.3 cm right hilar lymph node mass.  Borderline enlarged mediastinal adenopathy is scattered.  12 mm precarinal node on image 32.  Small prevascular nodes.  7 mm node at the thoracic inlet on the right on image 9.  Small bilateral pleural effusions  Dependent atelectasis bilaterally. Mild diffuse bronchiectasis and interlobular septal thickening.  This is nonspecific.  Review of the MIP  images confirms the above findings.  IMPRESSION: No evidence of acute pulmonary thromboembolism.  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  Small bilateral pleural effusions.  Abnormal right hilar and mediastinal adenopathy.  Inflammatory and neoplastic etiology are considerations.  At the minimum, 3- 6 months follow-up is recommended to ensure resolution.  Original Report Authenticated By: Donavan Burnet, M.D.   Dg Swallowing Func-no Report  05/02/2011  CLINICAL DATA: to assess for occult aspiration per Dr Kendrick Fries, approved  for MBS by Dr Ashley Royalty   FLUOROSCOPY FOR SWALLOWING FUNCTION STUDY:  Fluoroscopy was provided for swallowing function study, which was  administered by a speech pathologist.  Final results and recommendations  from this study are contained within the speech pathology report.       ECHO:  -Left ventricle: The cavity size was mildly dilated. Wall thickness was normal. Systolic function was severely reduced. The  estimated ejection fraction was in the range of 25% to 30%. Diffuse hypokinesis. Doppler parameters are consistent with restrictive physiology, indicative of decreased left ventricular diastolic compliance and/or increased left atrial pressure. - Aortic valve: There was no stenosis. - Mitral valve: Moderate regurgitation, likely due to annular dilatation with dilated LV.    Scheduled Meds:    . aspirin EC  81 mg Oral QHS  . atorvastatin  10 mg Oral q1800  . bisoprolol  5 mg Oral Daily  . calcium carbonate  1 tablet Oral Daily  . cholecalciferol  1,200 Units Oral Daily  . enoxaparin  40 mg Subcutaneous Q24H  . furosemide  20 mg Oral Daily  . hydrALAZINE  12.5 mg Oral TID  . isosorbide dinitrate  15 mg Oral TID  . levofloxacin (LEVAQUIN) IV  750 mg Intravenous Q24H  . methylPREDNISolone (SOLU-MEDROL) injection  60 mg Intravenous Q12H  . mometasone  1 spray Each Nare Daily  . omeprazole  40 mg Oral QAC breakfast  . pantoprazole  40 mg Oral QAC supper  . vitamin C  500 mg Oral Daily  . DISCONTD: omeprazole  40 mg Oral BID AC   Continuous Infusions:  PRN Meds:.acetaminophen, ALPRAZolam, calcium carbonate, HYDROcodone-acetaminophen, levalbuterol, magic mouthwash, ondansetron (ZOFRAN) IV, zolpidem  Assessment & Plan   Acute on chronic systolic congestive heart failure Ef 25% causing SOB - ? Viral Myocarditis - cards following, On B blocker and Lasix as tolerated by BP, No ACE / ARB per Cards D/W Dr Antoine Poche 05-04-11 he wants to hold off due to cough, pt says the same, on 02-hydralazine-Imdur looks close to compensated clinically.    Hypotension (04/30/2011) Resolved.Off IVF.   Anxiety attack (08/18/2010) Assessment: Patient much, less anxious today. This patient responds very well to being able to manage her expectations that she knows what they are. She is felt that with regard to her cough and wheezing she has not know what to expect and this makes her quite anxious. The patient also  has a lot of anxiety associated with the ingestion of medications and prefers only to take her medications from home.   Bronchiectasis (04/30/2011) Assessment: Being followed by pulmonology and put on the Steroids and Levaquin - wheezing resolved, some night time drop in Pox, ? OSA, continue O2, Pulm to see today D/W PCCM again 05-04-11. ? Change to PO steroid.   Pox dropped to 86 on RA upon ambulation, came to 92% with 1lit [place] o2 on 05-04-11.  Mediastinal lymphadenopathy (04/30/2011) Assessment: Mediastinal lymphadenopathy is concerning however it is not clear as to  whether or not this is reactive nonreactive. I will go ahead and treat her with Levaquin and she should have followup CT scans and outpt Pulm follow.  ABDOMINAL PAIN (11/22/2006) Assessment: The patient's abdominal pain is improved. It seems to be most prominent when she is coughing in the left lower chest wall. i.e musculoskeletal.   Tachycardia (04/28/2011) Assessment: The tachycardia is markedly improved. From combination of CHF-SOB,  anxiety is well as the effect of albuterol. The patient is actually being treated with Xopenex and this appears to be working well for her.  Hyperkalemia (04/30/2011) Assessment: Potassium being held at present , stable, caution with ACE-ARB if started by Cards.   Husband updated bedside 05-03-11, 05-04-11    DVT Prophylaxis  Lovenox    See all Orders from today for further details     Leroy Sea M.D  05/04/2011, 2:36 PM  [facility] Office  [phone]

## 2011-05-04 NOTE — Progress Notes (Signed)
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  Was seen in piulmonary office week prior to admission for cough, sore throat, subjective fevers.  A rapid flu was negative and we suggested symptomatic therapy.  As the week progressed she developed worsening cough, wheezing and eventually swelling in her legs.  She states that she had decreased her lasix to 1/2 of a 20mg  pill.  During this hospitalization she has had a low grade temp once and no elevated WBC.  PCCM  consulted 05/01/11 for bronchietasis, mediastinal lymphadenopathy, and wheezing.  Lines / Drains:   Cultures / Sepsis markers: 12/14 Urine: 20K E.coli 12/16 sputum: ngtd  Antibiotics: 12/16 Levaquin  Tests / Events: 12/14 CT angio chest: no PE, bilateral effusions, interstitial thickening, R hilar lymphadenopathy up to 2.7 cm in diameter    Sujbective/Ovrenight events  Continues to report cough. She may need she has significant sinus drainage that is chronic also admits to chronic cough. Prior history of negative methacholine challenge test. There is associated chronic acid reflux disease. She is rattling upset that her nasal steroid dose is being reduced in the hospital to once a day himself 2 puffs twice a day. She is most worried about her dyspnea fatigue that has been described to her as secondary to heart failure. She and her husband appear to be in angst about her situation. There were questions about both acute and chronic issues related to her cough and wheezing.   Prior to Admission medications   Medication Sig Start Date End Date Taking? Authorizing Provider  ALPRAZolam (XANAX) 0.25 MG tablet Take 0.25 mg by mouth. Takes half a pill prn   Yes Historical Provider, MD  Alum & Mag Hydroxide-Simeth (MAGIC  MOUTHWASH) SOLN Take 5 mLs by mouth 4 (four) times daily as needed. 04/26/11  Yes Max Fickle, MD  aspirin 81 MG EC tablet Take 81 mg by mouth at bedtime.    Yes Historical Provider, MD  atorvastatin (LIPITOR) 10 MG tablet Take 1 tablet (10 mg total) by mouth daily. Brand medically necessary. 08/18/10  Yes [name], MD  calcium carbonate (TUMS - DOSED IN MG ELEMENTAL CALCIUM) 500 MG chewable tablet Chew 1 tablet by mouth daily. heartburn    Yes Historical Provider, MD  Cholecalciferol (VITAMIN D3) 400 UNITS CAPS Take 3 capsules by mouth daily.    Yes Historical Provider, MD  ciclesonide (ALVESCO) 160 MCG/ACT inhaler Inhale 1 puff into the lungs 2 (two) times daily.     Yes Historical Provider, MD  dextromethorphan (DELSYM) 30 MG/5ML liquid 2 tsp every 12 hours as needed    Yes Historical Provider, MD  furosemide (LASIX) 20 MG tablet Take 10 mg by mouth daily. 1/2 tablet twice daily 04/12/11  Yes Antonieta Iba, MD  hydrALAZINE (APRESOLINE) 25 MG tablet Take 12.5 mg by mouth 3 (three) times daily.   04/03/11 04/02/12 Yes Max Fickle, MD  HYDROcodone-acetaminophen (VICODIN ES) 7.5-750 MG per tablet Take 1 tablet by mouth every 6 (six) hours as needed. pain   Yes Historical Provider, MD  isosorbide dinitrate (ISORDIL) 30 MG tablet Take 1/2 tablet three times a day. 04/17/11  Yes Antonieta Iba, MD  levalbuterol (XOPENEX) 1.25 MG/3ML nebulizer solution Take 1.25 mg by nebulization every 4 (four) hours as needed. 03/27/11  Yes Tammy Parrett,  NP  mometasone (NASONEX) 50 MCG/ACT nasal spray Place 1 spray into the nose 2 (two) times daily.    Yes Historical Provider, MD  naproxen sodium (ANAPROX) 220 MG tablet Per bottle   Yes Historical Provider, MD  omeprazole (PRILOSEC) 40 MG capsule Take 40 mg by mouth 2 (two) times daily.     Yes Historical Provider, MD  potassium chloride (K-DUR) 10 MEQ tablet Take 10 mEq by mouth 2 (two) times daily.   04/12/11  Yes Antonieta Iba, MD  VENTOLIN HFA  108 (90 BASE) MCG/ACT inhaler Take 2 puffs by mouth Every 4 hours as needed. 03/01/11  Yes Historical Provider, MD  vitamin C (ASCORBIC ACID) 500 MG tablet Take 500 mg by mouth daily.     Yes Historical Provider, MD  zolpidem (AMBIEN) 5 MG tablet 1/2 to 1 tablet at bedtime as needed For insomnia   01/20/11  Yes [name], MD   Allergies  Allergen Reactions  . Antihistamines, Chlorpheniramine-Type Other (See Comments)    Makes her eyes look like she sees strobe lights  . Atorvastatin     REACTION: muscle cramps. Pt states she's taken this now  . Influenza Vac Split (Flu Virus Vaccine) Other (See Comments)    Body aches  . Latex   . Penicillins     REACTION: immune to it, able to take amoxicillin  . Promethazine Hcl     REACTION: Dizzy, headache  . Rosuvastatin     REACTION: not effective  . Sulfasalazine    Family History  Problem Relation Age of Onset  . Lung cancer Father     + smoker  . Dementia Mother   . Osteoporosis Mother     Lumbar spine  . Stroke Mother     x 5 @ 88 YOA  . Arthritis Mother     hands  . Emphysema Mother   . Alcohol abuse Paternal Grandfather   . Hypertension Maternal Grandfather     ?  . Stroke Paternal Grandfather     ?  Marland Kitchen Heart disease      grandmother  . Heart disease      grandfather   Social History  reports that she has never smoked. She has never used smokeless tobacco. She reports that she does not drink alcohol or use illicit drugs.  Review Of Systems   Gen: + fever, chills, weight change, fatigue, night sweats HEENT: Denies blurred vision, double vision, hearing loss, tinnitus, sinus congestion, rhinorrhea, sore throat, neck stiffness, dysphagia PULM: per hpi CV: some mild chest pain with cough, positive swelling GI: Denies abdominal pain, nausea, vomiting, diarrhea, hematochezia, melena, constipation, change in bowel habits GU: Denies dysuria, hematuria, polyuria, oliguria, urethral discharge Endocrine: Denies hot or cold  intolerance, polyuria, polyphagia or appetite change Derm: Denies rash, dry skin, scaling or peeling skin change Heme: Denies easy bruising, bleeding, bleeding gums Neuro: Denies headache, numbness, weakness, slurred speech, loss of memory or consciousness  Vital Signs:   Filed Vitals:   05/04/11 0541 05/04/11 1006 05/04/11 1451 05/04/11 1514  BP: 116/75   110/69  Pulse: 82   85  Temp:    97.8 F (36.6 C)  TempSrc:    Oral  Resp:    20  Height:      Weight:      SpO2:  97% 97% 96%    Physical Examination: Gen: coughing, no respiratory distress HEENT: NCAT, PERRL, EOMi, OP clear, neck supple without masses PULM: Exp wheezing throughout, few insp  crackles, diminished in bases bilaterally CV: RRR, no mgr, elevated JVD noted AB: BS+, soft, nontender, no hsm Ext: warm, edema in legs noted, no clubbing, no cyanosis Derm: no rash or skin breakdown Neuro: A&Ox4, CN II-XII intact, strength 5/5 in all 4 extremities  Assessment and Plan:   Shortness of breath and Fatigue (04/03/2011)   Assessment: I think that this is multi-factorial.  The biggest issue is congestive heart failure. I think that she has bronchitis and needs antibiotics as you are doing.  See discussion of bronchiectasis below.  Wheezing I think wheezing is due to heart failure but there might be a component of cyclical cough otherwise called LPR cough or vocal cord dysfunction contributing to the wheezing as well. Acutely control of heart failure is the most pressing issue. Chronically of the reasons for chronic cough including sinus and acid reflux her to be controlled along with a speech therapy for potentially voice rest with Neurontin therapy for cyclical cough that results in pseduo wheze.  I do not think bronchiectasis is playing a role   Plan:  - Stop steroids in the next few days.  We'll let [facility] to the taper over the next 3-5 days. I do not see indication for steroid therapy  -prn nebs -levaquin as  you are doing  Bronchiectasis (04/30/2011)   Assessment: very mild basilar bronchiectasis in bases.  She does not have a chronic sputum production and I think that it is very unlikely to be contributing much here.  Could be due to chronic aspiration given distribution.   Plan:  -[facility] follow  ordered a swallow eval from 05/02/2011 -agree with levaquin, favor short course (7 days)  Acute on chronic systolic congestive heart failure (02/24/2011)   Assessment: Cardiology following   Plan:  -Per cardiology Kalman [facility] Pager: [phone]  05/04/2011, 9:57 PM  Permit critical care medicine will sign off

## 2011-05-04 NOTE — Progress Notes (Signed)
Pt and husband selected [facility] for home O2. Referral given.mp

## 2011-05-04 NOTE — Progress Notes (Signed)
   SUBJECTIVE:  Breathing better although she still desaturates and has coughing.  PHYSICAL EXAM Filed Vitals:   05/04/11 0537 05/04/11 0539 05/04/11 0541 05/04/11 1006  BP: 102/64 116/75 116/75   Pulse: 82 80 82   Temp: 97.5 F (36.4 C)     TempSrc: Oral     Resp: 20     Height:      Weight: 155 lb 1.6 oz (70.353 kg)     SpO2: 94%   97%   General:  Coughing.  Looks fatigued Lungs:  No crackles Heart:  RRR, no murmur Abdomen:  Positive bowel sounds, no rebound no guarding Extremities:  No edema.  LABS: Lab Results  Component Value Date   CKTOTAL 140 04/29/2011   CKMB 3.1 04/29/2011   TROPONINI <0.30 04/29/2011   Results for orders placed during the hospital encounter of 04/28/11 (from the past 24 hour(s))  BASIC METABOLIC PANEL     Status: Abnormal   Collection Time   05/04/11  4:37 AM      Component Value Range   Sodium 136  135 - 145 (mEq/L)   Potassium 4.4  3.5 - 5.1 (mEq/L)   Chloride 101  96 - 112 (mEq/L)   CO2 22  19 - 32 (mEq/L)   Glucose, Bld 186 (*) 70 - 99 (mg/dL)   BUN 35 (*) 6 - 23 (mg/dL)   Creatinine, Ser 1.61  0.50 - 1.10 (mg/dL)   Calcium 09.6  8.4 - 10.5 (mg/dL)   GFR calc non Af Amer 56 (*) >90 (mL/min)   GFR calc Af Amer 65 (*) >90 (mL/min)  MAGNESIUM     Status: Normal   Collection Time   05/04/11  4:37 AM      Component Value Range   Magnesium 2.5  1.5 - 2.5 (mg/dL)    Intake/Output Summary (Last 24 hours) at 05/04/11 1432 Last data filed at 05/03/11 1900  Gross per 24 hour  Intake    480 ml  Output      0 ml  Net    480 ml    ASSESSMENT AND PLAN:   1) Acute on chronic systolic congestive heart failure:  I/O incomplete.  Weight is down since admission.  Lasix started yesterday.  Check BMET and proBNP in the am.    2) Tachycardia:  Telemetry reviewed.  Heart rate well controlled.       Rollene Rotunda 05/04/2011 2:32 PM

## 2011-05-05 DIAGNOSIS — I5023 Acute on chronic systolic (congestive) heart failure: Secondary | ICD-10-CM

## 2011-05-05 LAB — BASIC METABOLIC PANEL
BUN: 32 mg/dL — ABNORMAL HIGH (ref 6–23)
CO2: 24 mEq/L (ref 19–32)
Calcium: 10 mg/dL (ref 8.4–10.5)
Chloride: 100 mEq/L (ref 96–112)
Creatinine, Ser: 0.82 mg/dL (ref 0.50–1.10)
GFR calc Af Amer: 85 mL/min — ABNORMAL LOW (ref >90)
GFR calc non Af Amer: 73 mL/min — ABNORMAL LOW (ref >90)
Glucose, Bld: 187 mg/dL — ABNORMAL HIGH (ref 70–99)
Potassium: 4.4 mEq/L (ref 3.5–5.1)
Sodium: 136 mEq/L (ref 135–145)

## 2011-05-05 LAB — PRO B NATRIURETIC PEPTIDE: Pro B Natriuretic peptide (BNP): 7399 pg/mL — ABNORMAL HIGH (ref 0–125)

## 2011-05-05 LAB — MAGNESIUM: Magnesium: 2.6 mg/dL — ABNORMAL HIGH (ref 1.5–2.5)

## 2011-05-05 MED ORDER — LEVOFLOXACIN 500 MG PO TABS: 500.0000 mg | ORAL_TABLET | Freq: Every day | ORAL | Status: DC

## 2011-05-05 MED ORDER — LEVALBUTEROL HCL 1.25 MG/3ML IN NEBU: 1.0000 | INHALATION_SOLUTION | RESPIRATORY_TRACT | Status: DC | PRN

## 2011-05-05 MED ORDER — FUROSEMIDE 20 MG PO TABS
40.0000 mg | ORAL_TABLET | Freq: Every day | ORAL | Status: DC
  Administered 2011-05-05: 40 mg via ORAL
  Filled 2011-05-05: qty 1

## 2011-05-05 MED ORDER — PREDNISONE 5 MG PO TABS: 5.0000 mg | ORAL_TABLET | Freq: Every day | ORAL | Status: DC

## 2011-05-05 MED ORDER — FUROSEMIDE 40 MG PO TABS: 40.0000 mg | ORAL_TABLET | Freq: Every day | ORAL | Status: DC

## 2011-05-05 MED ORDER — BISOPROLOL FUMARATE 5 MG PO TABS: 5.0000 mg | ORAL_TABLET | Freq: Every day | ORAL | Status: DC

## 2011-05-05 NOTE — Progress Notes (Addendum)
Physical Therapy Evaluation Patient Details Name: Ashley [name] MRN: [id number] DOB: 04-24-1946 Today's Date: 05/05/2011  Problem List:  Patient Active Problem List  Diagnoses  . GENITAL HERPES  . HYPERCHOLESTEROLEMIA, PURE  . COMMON MIGRAINE  . DIVERTICULOSIS, COLON  . IRRITABLE BOWEL SYNDROME  . FIBROCYSTIC BREAST DISEASE  . BREAST MASS, LEFT  . OSTEOARTHRITIS  . FIBROMYALGIA  . DIARRHEA  . ABDOMINAL PAIN  . Anxiety attack  . Polyp of vocal cord or larynx  . Abdominal  pain, other specified site  . Sinus congestion  . Syncope  . Fatigue  . Cough  . Left foot pain  . Mitral regurgitation  . Acute on chronic systolic congestive heart failure  . Cardiomyopathy  . Shortness of breath  . Sore throat  . Tachycardia  . Hypokalemia  . Hypotension  . Bronchiectasis  . Mediastinal lymphadenopathy  . Acute bronchitis    Past Medical History:  Past Medical History  Diagnosis Date  . Lump or mass in breast   . Carpal tunnel syndrome   . Migraine without aura, without mention of intractable migraine without mention of status migrainosus   . Diarrhea   . Diverticulosis of colon     no diverticulitis  . Fibromyalgia   . Pure hypercholesterolemia   . Irritable bowel syndrome   . Laryngeal nodule     Dr. Thelma Comp  . Fibrocystic breast disease   . Osteoarthritis   . Genital herpes   . Hemorrhoids   . Anxiety   . GERD (gastroesophageal reflux disease)   . Systolic CHF     EF 25-35%, LV dilation, significant MR  . Nonischemic cardiomyopathy   . Pneumonia   . Mitral regurgitation   . Anxiety   . Pleural effusion, bilateral 2012   Past Surgical History:  Past Surgical History  Procedure Date  . Appendectomy   . Mandible surgery   . Knee arthroscopy w/ orif     Left  . Osteotomy     Left foot  . 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 Total abdominal hysterectomy w/ bilateral salpingoophorectomy 1987    Endometriosis  . Dexa 03/28/99    osteopenia L/S  .  Colonoscopy 1998    nml (sigmoid-Gilbert-nol)  . Head mri, head ct 10/1998    ? H/A focus ([facility])  . Emg/mcv 10/16/01    + mild carpal tunnel  . Dexa 03/12/03    2.2 Spine 0.1 Hip  Osteopenia  . Mri right hip 12/12/01    Tendonitis Gluteus Medius to Gtr Trochanter Neg Bursitis (Dr. Montez Morita)  . L/s films 11/21/01    nml; Right hip nml  . Mri u/s 11/21/01    min. DDD L/3-4  . Colonoscopy 01/04/04    polyps, diverticulosis/severe  . Dexa 11/14/05    1.9 Spine 0.3 Hip  slight improvement  . Flex laryngoscopy 06/11/06    ([facility]) nml  . Dexa 11/22/06    improved osteo and fem neck  . [facility] bunionectomy 11/04/08    Right Great Toe (Dr. Lestine Box)  . Breast mass excision 01/2010    Left-fibrocystic change w/intraductal papilloma, no malignancy  . Ct chest     12/2010 - possible bronchiectasis lower lobes, 02/2011 - enlarged bilateral effusions, bibasilar atx  . Stress myoview 02/2011    no significant ischemia    PT Assessment/Plan/Recommendation PT Assessment Clinical Impression Statement: Pt presents with decreased activity tolerance and generalized weakness compared to baseline, requiring several rest breaks during  eval today. VSS throughout tx on 2L O2 with portable tank. Pt educated on home safety precautions, and was advised to use her 4WW initially for safe transition home. Pt's continued needs for increased activity tolernace will be able to be met in outpt cardiac therapy. PT to sign off at this time.  PT Recommendation/Assessment: All further PT needs can be met in the next venue of care PT Problem List: Decreased activity tolerance;Decreased knowledge of use of DME;Decreased strength PT Therapy Diagnosis : Generalized weakness PT Recommendation Follow Up Recommendations: 24hr Supervision/Assist.  (Pt reports she is scheduled for outpt Cardiac rehab 1/13. If she will not have cardiac therapy, HHPT needed) Equipment Recommended: None recommended by PT    PT  Evaluation Precautions/Restrictions  Precautions Precautions: Fall Restrictions Weight Bearing Restrictions: No Prior Functioning  Home Living Lives With: Spouse Receives Help From: Family Type of Home: House Home Layout: Two level (Sleeps on couch at home for elevated HOB downstairs) Alternate Level Stairs-Rails: Can reach both Alternate Level Stairs-Number of Steps: 13 Home Access: Stairs to enter Entrance Stairs-Rails: Can reach both Entrance Stairs-Number of Steps: 5 Bathroom Shower/Tub: Tub/shower unit;Curtain Firefighter: Standard Home Adaptive Equipment: Dan Humphreys - four wheeled;Straight cane Additional Comments: Has not used DME Prior Function Level of Independence: Needs assistance with homemaking;Independent with basic ADLs;Independent with transfers;Independent with gait (Spouse does housework) Able to Take Stairs?: Yes Driving: Yes Leisure: Hobbies-yes (Comment) Comments: Takes care of grandchildren Cognition Cognition Arousal/Alertness: Awake/alert Overall Cognitive Status: Appears within functional limits for tasks assessed Sensation/Coordination Sensation Light Touch: Appears Intact Coordination Gross Motor Movements are Fluid and Coordinated: Yes Fine Motor Movements are Fluid and Coordinated: Yes Extremity Assessment RUE Assessment RUE Assessment: Within Functional Limits LUE Assessment LUE Assessment: Within Functional Limits RLE Assessment RLE Assessment: Within Functional Limits LLE Assessment LLE Assessment: Within Functional Limits Mobility (including Balance) Bed Mobility Bed Mobility: Yes Supine to Sit: 6: Modified independent (Device/Increase time) Sit to Supine - Left: 6: Modified independent (Device/Increase time) (Increased timee) Transfers Transfers: Yes Sit to Stand: 5: Supervision;With upper extremity assist;From bed Sit to Stand Details (indicate cue type and reason): S for safety due to having been in bed several days Stand to  Sit: 5: Supervision Stand to Sit Details: Cues to bring RW all the way Ambulation/Gait Ambulation/Gait: Yes Ambulation/Gait Assistance: 5: Supervision Ambulation/Gait Assistance Details (indicate cue type and reason): Pt educated on RW, and needed 3 standing rest breaks during walk Ambulation Distance (Feet): 200 Feet Assistive device: Rolling walker Gait Pattern: Within Functional Limits Stairs: Yes Stairs Assistance: 5: Supervision Stairs Assistance Details (indicate cue type and reason): Cues for step-to pattern, progressed from Min A-S Stair Management Technique: One rail Right;Step to pattern Number of Stairs: 5  Height of Stairs: 6  Wheelchair Mobility Wheelchair Mobility: No  Posture/Postural Control Posture/Postural Control: No significant limitations Balance Balance Assessed: No   End of Session PT - End of Session Equipment Utilized During Treatment: Gait belt Activity Tolerance: Patient tolerated treatment well Patient left: in bed;with call bell in reach (Window seat) Nurse Communication: Mobility status for ambulation;Mobility status for transfers General Behavior During Session: [facility] for tasks performed Cognition: [facility] for tasks performed  [employer], [place] [phone] 05/05/2011, 9:28 AM

## 2011-05-05 NOTE — Plan of Care (Signed)
Problem: Discharge Progression Outcomes Goal: Discharge plan in place and appropriate Outcome: Progressing Pt safe to go home with 24hr S/A of spouse and Cardiac rehab to begin in 1/13 per pt. Pt advised to use 4WW at all times initially for safe transition.  Clydene Laming, [place] [phone] 05/05/2011 9:32

## 2011-05-05 NOTE — Progress Notes (Signed)
   SUBJECTIVE:  She said that she has some mild chest discomfort last night. Still coughing.  PHYSICAL EXAM Filed Vitals:   05/05/11 0500 05/05/11 0650 05/05/11 0651 05/05/11 0652  BP:  120/75 127/78 128/81  Pulse:  73 80 85  Temp:  97.8 F (36.6 C)    TempSrc:  Oral    Resp:  18    Height:      Weight: 153 lb 14.1 oz (69.8 kg)     SpO2:       General:  Coughing.  Looks fatigued Lungs:  No crackles Heart:  RRR, no murmur Abdomen:  Positive bowel sounds, no rebound no guarding Extremities:  No edema.  LABS: Lab Results  Component Value Date   CKTOTAL 140 04/29/2011   CKMB 3.1 04/29/2011   TROPONINI <0.30 04/29/2011   Results for orders placed during the hospital encounter of 04/28/11 (from the past 24 hour(s))  BASIC METABOLIC PANEL     Status: Abnormal   Collection Time   05/05/11  4:26 AM      Component Value Range   Sodium 136  135 - 145 (mEq/L)   Potassium 4.4  3.5 - 5.1 (mEq/L)   Chloride 100  96 - 112 (mEq/L)   CO2 24  19 - 32 (mEq/L)   Glucose, Bld 187 (*) 70 - 99 (mg/dL)   BUN 32 (*) 6 - 23 (mg/dL)   Creatinine, Ser 1.61  0.50 - 1.10 (mg/dL)   Calcium 09.6  8.4 - 10.5 (mg/dL)   GFR calc non Af Amer 73 (*) >90 (mL/min)   GFR calc Af Amer 85 (*) >90 (mL/min)  MAGNESIUM     Status: Abnormal   Collection Time   05/05/11  4:26 AM      Component Value Range   Magnesium 2.6 (*) 1.5 - 2.5 (mg/dL)  PRO B NATRIURETIC PEPTIDE     Status: Abnormal   Collection Time   05/05/11  4:26 AM      Component Value Range   Pro B Natriuretic peptide (BNP) 7399.0 (*) 0 - 125 (pg/mL)    Intake/Output Summary (Last 24 hours) at 05/05/11 0739 Last data filed at 05/04/11 2141  Gross per 24 hour  Intake    720 ml  Output    650 ml  Net     70 ml    ASSESSMENT AND PLAN:   1) Acute on chronic systolic congestive heart failure:  Question whether the I/Os are complete.  Her weight is trending down.  She was not orthostatic yesterday.  I will inch up on the Lasix.  She was  dehydrated recently on 40 mg lasix daily at home.   2) MR:  Moderate unchanged.      Rollene Rotunda 05/05/2011 7:39 AM

## 2011-05-05 NOTE — Discharge Summary (Addendum)
Ashley [name], 65 y.o., DOB March 10, 1946, MRN [id number]. Admission date: 04/28/2011 Discharge Date 05/05/2011 Primary MD [name], MD, MD Admitting Physician Therisa Doyne  Admission Diagnosis  Shortness of breath [786.05] Tachycardia [785.0] SHORTNESS OF BREATH  Discharge Diagnosis   Principal Problem:  *Acute on chronic systolic congestive heart failure Active Problems:  ABDOMINAL PAIN  Anxiety attack  Shortness of breath  Tachycardia  Hypokalemia  Hypotension  Bronchiectasis  Mediastinal lymphadenopathy  Acute bronchitis      Past Medical History  Diagnosis Date  . Lump or mass in breast   . Carpal tunnel syndrome   . Migraine without aura, without mention of intractable migraine without mention of status migrainosus   . Diarrhea   . Diverticulosis of colon     no diverticulitis  . Fibromyalgia   . Pure hypercholesterolemia   . Irritable bowel syndrome   . Laryngeal nodule     Dr. Thelma Comp  . Fibrocystic breast disease   . Osteoarthritis   . Genital herpes   . Hemorrhoids   . Anxiety   . GERD (gastroesophageal reflux disease)   . Systolic CHF     EF 25-35%, LV dilation, significant MR  . Nonischemic cardiomyopathy   . Pneumonia   . Mitral regurgitation   . Anxiety   . Pleural effusion, bilateral 2012    Past Surgical History  Procedure Date  . Appendectomy   . Mandible surgery   . Knee arthroscopy w/ orif     Left  . Osteotomy     Left foot  . 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 Total abdominal hysterectomy w/ bilateral salpingoophorectomy 1987    Endometriosis  . Dexa 03/28/99    osteopenia L/S  . Colonoscopy 1998    nml (sigmoid-Gilbert-nol)  . Head mri, head ct 10/1998    ? H/A focus ([facility])  . Emg/mcv 10/16/01    + mild carpal tunnel  . Dexa 03/12/03    2.2 Spine 0.1 Hip  Osteopenia  . Mri right hip 12/12/01    Tendonitis Gluteus Medius to Gtr Trochanter Neg Bursitis (Dr. Montez Morita)  . L/s films 11/21/01    nml; Right hip  nml  . Mri u/s 11/21/01    min. DDD L/3-4  . Colonoscopy 01/04/04    polyps, diverticulosis/severe  . Dexa 11/14/05    1.9 Spine 0.3 Hip  slight improvement  . Flex laryngoscopy 06/11/06    ([facility]) nml  . Dexa 11/22/06    improved osteo and fem neck  . [place] bunionectomy 11/04/08    Right Great Toe (Dr. Lestine Box)  . Breast mass excision 01/2010    Left-fibrocystic change w/intraductal papilloma, no malignancy  . Ct chest     12/2010 - possible bronchiectasis lower lobes, 02/2011 - enlarged bilateral effusions, bibasilar atx  . Stress myoview 02/2011    no significant ischemia     See H&P, Labs, Consult and Test reports for all details in brief, patient was admitted for   Acute Resp failure likely from Acute on Chr Systolic CHF with ongoing mild Vocal Cord Issues and possible mild URI - she was seen by cardiology and pulmonary placed on Lasix-steroids and Levaquin with good recovery, now rales and edema free although still has lack of energy, on B blocker and Imdur, no ACE-ARB per cardiology due to +++Cough, will need close weight,BMP monitoring by PCP and her Cardiologist, will get Home health, o2 and PT for home. Husband stays with her and will  supervise her.  Yesterday her Pox dropped to 86 on RA upon ambulation, came to 92% with 1-2 lit [place] o2 at rest on 05-04-11. She also had multiple Pox desats under 88% while sleeping on RA which improved on 2lits O2 on activity, at rest on Room air she is 89-90% but is extremely uncomfortable. She feels much better on 2lit [place] o2.  Will request PCP to consider referral to CHF clinic as outpt.   ? Bronchiectasis  - URI - was seen by pulm again yesterday, they recommend fast Steroid taper over 4-5 days, Nebs-o2 as needed and outpt Pulm follow.      She had Mediastinal lymphadenopathy on CT which could be reactive will finish levaquin and will need repeat Ct scan in 1 month by Primary MD.   Few days ago had mild Hyperkalemia - Potassium being held at  present , stable, caution with ACE-ARB if started by Cards. Request Primary MD to  Please monitor BMP 1/week for a month.  She will follow with [facility] for ? Vocal cord problems causing some upper airway wheezing from time to time.    Consults  Cardiology- Pulm, PT    Significant Tests:  See full reports for all details    ECHO:  -Left ventricle: The cavity size was mildly dilated. Wall thickness was normal. Systolic function was severely reduced. The estimated ejection fraction was in the range of 25% to 30%. Diffuse hypokinesis. Doppler parameters are consistent with restrictive physiology, indicative of decreased left ventricular diastolic compliance and/or increased left atrial pressure. - Aortic valve: There was no stenosis. - Mitral valve: Moderate regurgitation, likely due to annular dilatation with dilated LV.   Dg Chest 2 View  05/01/2011  *RADIOLOGY REPORT*  Clinical Data: Cough.  History of CHF.  Shortness of breath.  CHEST - 2 VIEW  Comparison: 04/26/2011  Findings: There is cardiomegaly.  Peribronchial thickening.  No overt edema.  Left basilar opacity, likely atelectasis.  Right lung is clear.  Small effusions on the lateral view.  No acute bony abnormality.  IMPRESSION: Cardiomegaly.  Left lower lobe opacity, likely atelectasis.  Small effusions.  Original Report Authenticated By: Cyndie Chime, M.D.   Dg Chest 2 View  04/26/2011  *RADIOLOGY REPORT*  Clinical Data: Shortness of breath.  Wheezing.  CHEST - 2 VIEW  Comparison: 01/23/2011  Findings: Heart size is normal.  The aorta is unfolded.  There is bronchial thickening but no infiltrate, collapse or effusion.  Mild degenerative changes effect the spine.  IMPRESSION: Bronchial thickening without consolidation or collapse.  Original Report Authenticated By: Thomasenia Sales, M.D.   Ct Angio Chest W/cm &/or Wo Cm  04/28/2011  *RADIOLOGY REPORT*  Clinical Data:  Short of breath  CT ANGIOGRAPHY CHEST WITH CONTRAST  Technique:   Multidetector CT imaging of the chest was performed using the standard protocol during bolus administration of intravenous contrast.  Multiplanar CT image reconstructions including MIPs were obtained to evaluate the vascular anatomy.  Contrast: OMNIPAQUE IOHEXOL 300 MG/ML IV SOLN  Comparison:  None  Findings:  There are no filling defects in the pulmonary arterial tree to suggest acute pulmonary thromboembolism.  2.7 x 1.3 cm right hilar lymph node mass.  Borderline enlarged mediastinal adenopathy is scattered.  12 mm precarinal node on image 32.  Small prevascular nodes.  7 mm node at the thoracic inlet on the right on image 9.  Small bilateral pleural effusions  Dependent atelectasis bilaterally. Mild diffuse bronchiectasis and interlobular septal  thickening.  This is nonspecific.  Review of the MIP images confirms the above findings.  IMPRESSION: No evidence of acute pulmonary thromboembolism.  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  Small bilateral pleural effusions.  Abnormal right hilar and mediastinal adenopathy.  Inflammatory and neoplastic etiology are considerations.  At the minimum, 3- 6 months follow-up is recommended to ensure resolution.  Original Report Authenticated By: Donavan Burnet, M.D.   Dg Swallowing Func-no Report  05/02/2011  CLINICAL DATA: to assess for occult aspiration per Dr Kendrick Fries, approved  for MBS by Dr Ashley Royalty   FLUOROSCOPY FOR SWALLOWING FUNCTION STUDY:  Fluoroscopy was provided for swallowing function study, which was  administered by a speech pathologist.  Final results and recommendations  from this study are contained within the speech pathology report.      Today   Subjective:   Jacklynn Bue today has no headache,no chest abdominal pain,no new weakness tingling or numbness, feels much better wants to go home today.    Objective:   Blood pressure 130/78, pulse 85, temperature 97.8 F (36.6 C),  temperature source Oral, resp. rate 18, height 5\' 5"  (1.651 m), weight 69.8 kg (153 lb 14.1 oz), SpO2 96.00%.  Intake/Output Summary (Last 24 hours) at 05/05/11 1307 Last data filed at 05/04/11 2141  Gross per 24 hour  Intake    480 ml  Output    650 ml  Net   -170 ml    Exam Awake Alert, Oriented *3, No new F.N deficits, Normal affect [place].AT,PERRAL Supple Neck,No JVD, No cervical lymphadenopathy appriciated.  Symmetrical Chest wall movement, Good air movement bilaterally, CTAB RRR,No Gallops,Rubs or new Murmurs, No Parasternal Heave +ve B.Sounds, Abd Soft, Non tender, No organomegaly appriciated, No rebound -guarding or rigidity. No Cyanosis, Clubbing or edema, No new Rash or bruise  Data Review    CBC w Diff: Lab Results  Component Value Date   WBC 9.6 04/29/2011   HGB 10.4* 04/29/2011   HCT 32.1* 04/29/2011   PLT 279 04/29/2011   LYMPHOPCT 21.0 11/01/2010   MONOPCT 8.2 11/01/2010   EOSPCT 1.2 11/01/2010   BASOPCT 0.6 11/01/2010   CMP: Lab Results  Component Value Date   NA 136 05/05/2011   NA 140 04/19/2011   K 4.4 05/05/2011   CL 100 05/05/2011   CO2 24 05/05/2011   BUN 32* 05/05/2011   BUN 15 04/19/2011   CREATININE 0.82 05/05/2011   PROT 7.4 01/24/2011   ALBUMIN 4.3 01/24/2011   BILITOT 0.4 01/24/2011   ALKPHOS 77 01/24/2011   AST 18 01/24/2011   ALT 22 01/24/2011  . Discharge Instructions     Follow with Primary MD [name], MD, MD in 7 days   Get CBC, CMP, checked 7 days by Primary MD and again as instructed by your Primary MD. Get a 2 view Chest X ray done next visit.  Get Medicines reviewed and adjusted.  Please request your Prim.MD to go over all Hospital Tests and Procedure/Radiological results at the follow up, please get all Hospital records sent to your Prim MD by signing hospital release before you go home.  Activity: Fall precautions use walker/cane & assistance as needed  Diet: Heart Healthy,  Fluid restriction 1.8 lit/day, Aspiration  precautions.  Check your Weight same time everyday, if you gain over 2 pounds, or you develop in leg swelling, experience more shortness of breath or chest pain, call your Primary MD immediately. Follow Cardiac Low Salt Diet and  1.8 lit/day fluid restriction.  Disposition Home  If you experience worsening of your admission symptoms, develop shortness of breath, life threatening emergency, suicidal or homicidal thoughts you must seek medical attention immediately by calling 911 or calling your MD immediately  if symptoms less severe.  You Must read complete instructions/literature along with all the possible adverse reactions/side effects for all the Medicines you take and that have been prescribed to you. Take any new Medicines after you have completely understood and accpet all the possible adverse reactions/side effects.   Do not drive if your were admitted for syncope or siezures until you have seen by Primary MD or a Neurologist and advised to drive.  Do not drive when taking Pain medications.    Do not take more than prescribed Pain, Sleep and Anxiety Medications  Special Instructions: If you have smoked or chewed Tobacco  in the last 2 yrs please stop smoking, stop any regular Alcohol  and or any Recreational drug use.  Wear Seat belts while driving.   Heart Failure Heart failure (HF) is a condition in which the heart has trouble pumping blood. This means your heart does not pump blood efficiently for your body to work well. In some cases of HF, fluid may back up into your lungs or you may have swelling (edema) in your lower legs. HF is a long-term (chronic) condition. It is important for you to take good care of yourself and follow your caregiver's treatment plan. CAUSES   Health conditions:   High blood pressure (hypertension) causes the heart muscle to work harder than normal. When pressure in the blood vessels is high, the heart needs to pump (contract) with more force in order  to circulate blood throughout the body. High blood pressure eventually causes the heart to become stiff and weak.   Coronary artery disease (CAD) is the buildup of cholesterol and fat (plaques) in the arteries of the heart. The blockage in the arteries deprives the heart muscle of oxygen and blood. This can cause chest pain and may lead to a heart attack. High blood pressure can also contribute to CAD.   Heart attack (myocardial infarction) occurs when 1 or more arteries in the heart become blocked. The loss of oxygen damages the muscle tissue of the heart. When this happens, part of the heart muscle dies. The injured tissue does not contract as well and weakens the heart's ability to pump blood.   Abnormal heart valves can cause HF when the heart valves do not open and close properly. This makes the heart muscle pump harder to keep the blood flowing.   Heart muscle disease (cardiomyopathy or myocarditis) is damage to the heart muscle from a variety of causes. These can include drug or alcohol abuse, infections, or unknown reasons. These can increase the risk of HF.   Lung disease makes the heart work harder because the lungs do not work properly. This can cause a strain on the heart leading it to fail.   Diabetes increases the risk of HF. High blood sugar contributes to high fat (lipid) levels in the blood. Diabetes can also cause slow damage to tiny blood vessels that carry important nutrients to the heart muscle. When the heart does not get enough oxygen and food, it can cause the heart to become weak and stiff. This leads to a heart that does not contract efficiently.   Other diseases can contribute to HF. These include abnormal heart rhythms, thyroid problems, and low blood counts (anemia).  Unhealthy lifestyle habits:   Obesity.   Smoking.   Eating foods high in fat and cholesterol.   Eating or drinking beverages high in salt.   Drug or alcohol abuse.   Lack of exercise.  SYMPTOMS   HF symptoms may vary and can be hard to detect. Symptoms may include:  Shortness of breath with activity, such as climbing stairs.   Persistent cough.   Swelling of the feet, ankles, legs, or abdomen.   Unexplained weight gain.   Difficulty breathing when lying flat.   Waking from sleep because of the need to sit up and get more air.   Rapid heartbeat.   Fatigue and loss of energy.   Feeling lightheaded or close to fainting.  DIAGNOSIS  A diagnosis of HF is based on your history, symptoms, physical examination, and diagnostic tests. Diagnostic tests for HF may include:  EKG.   Chest X-ray.   Blood tests.   Exercise stress test.   Blood oxygen test (arterial blood gas).   Evaluation by a heart doctor (cardiologist).   Ultrasound evaluation of the heart (echocardiogram).   Heart artery test to look for blockages (angiogram).   Radioactive imaging to look at the heart (radionuclide test).  TREATMENT  Treatment is aimed at managing the symptoms of HF. Medicines, lifestyle changes, or surgical intervention may be necessary to treat HF.  Medicines to help treat HF may include:   Angiotensin-converting enzyme (ACE) inhibitors. These block the effects of a blood protein called angiotensin-converting enzyme. ACE inhibitors relax (dilate) the blood vessels and help lower blood pressure. This decreases the workload of the heart, slows the progression of HF, and improves symptoms.   Angiotensin receptor blockers (ARBs). These medications work similar to ACE inhibitors. ARBs may be an alternative for people who cannot tolerate an ACE inhibitor.   Aldosterone antagonists. This medication helps get rid of extra fluid from your body. This lowers the volume of blood the heart has to pump.   Water pills (diuretics). Diuretics cause the kidneys to remove salt and water from the blood. The extra fluid is removed by urination. By removing extra fluid from the body, diuretics help  lower the workload of the heart and help prevent fluid buildup in the lungs so breathing is easier.   Beta blockers. These prevent the heart from beating too fast and improve heart muscle strength. Beta blockers help maintain a normal heart rate, control blood pressure, and improve HF symptoms.   Digitalis. This increases the force of the heartbeat and may be helpful to people with HF or heart rhythm problems.   Healthy lifestyle changes include:   Stopping smoking.   Eating a healthy diet. Avoid foods high in fat. Avoid foods fried in oil or made with fat. A dietician can help with healthy food choices.   Limiting how much salt you eat.   Limiting alcohol intake to no more than 1 drink per day for women and 2 drinks per day for men. Drinking more than that is harmful to your heart. If your heart has already been damaged by alcohol or you have severe HF, drinking alcohol should be stopped completely.   Exercising as directed by your caregiver.   Surgical treatment for HF may include:   Procedures to open blocked arteries, repair damaged heart valves, or remove damaged heart muscle tissue.   A pacemaker to help heart muscle function and to control certain abnormal heart rhythms.   A defibrillator to possibly prevent sudden cardiac death.  HOME CARE INSTRUCTIONS   Activity level. Your caregiver can help you determine what type of exercise program may be helpful. It is important to maintain your strength. Pace your physical activity to avoid shortness of breath or chest pain. Rest for 1 hour before and after meals. A cardiac rehabilitation program may be helpful to some people with HF.   Diet. Eat a heart healthy diet. Food choices should be low in saturated fat and cholesterol. Talk to a dietician to learn about heart healthy foods.   Salt intake. When you have HF, you need to limit the amount of salt you eat. Eat less than 1500 milligrams (mg) of salt per day or as recommended by your  caregiver.   Weight monitoring. Weigh yourself every day. You should weigh yourself in the morning after you urinate and before you eat breakfast. Wear the same amount of clothing each time you weigh yourself. Record your weight daily. Bring your recorded weights to your clinic visits. Tell your caregiver right away if you have gained 3 lb/1.4 kg in 1 day, or 5 lb/2.3 kg in a week or whatever amount you were told to report.   Blood pressure monitoring. This should be done as directed by your caregiver. A home blood pressure cuff can be purchased at a drugstore. Record your blood pressure numbers and bring them to your clinic visits. Tell your caregiver if you become dizzy or lightheaded upon standing up.   Smoking. If you are currently a smoker, it is time to quit. Nicotine makes your heart work harder by causing your blood vessels to constrict. Do not use nicotine gum or patches before talking to your caregiver.   Follow up. Be sure to schedule a follow-up visit with your caregiver. Keep all your appointments.  SEEK MEDICAL CARE IF:   Your weight increases by 3 lb/1.4 kg in 1 day or 5 lb/2.3 kg in a week.   You notice increasing shortness of breath that is unusual for you. This may happen during rest, sleep, or with activity.   You cough more than normal, especially with physical activity.   You notice more swelling in your hands, feet, ankles, or belly (abdomen).   You are unable to sleep because it is hard to breathe.   You cough up bloody mucus (sputum).   You begin to feel "jumping" or "fluttering" sensations (palpitations) in your chest.  SEEK IMMEDIATE MEDICAL CARE IF:   You have severe chest pain or pressure which may include symptoms such as:   Pain or pressure in the arms, neck, jaw, or back.   Feeling sweaty.   Feeling sick to your stomach (nauseous).   Feeling short of breath while at rest.   Having a fast or irregular heartbeat.   You experience stroke symptoms.  These symptoms include:   Facial weakness or numbness.   Weakness or numbness in an arm, leg, or on one side of your body.   Blurred vision.   Difficulty talking or thinking.   Dizziness or fainting.   Severe headache.  THESE ARE MEDICAL EMERGENCIES. Do not wait to see if the symptoms go away. Call your local emergency services (911 in [place] DO NOT drive yourself to the hospital. IMPORTANT  Make a list of every medicine, vitamin, or herbal supplement you are taking. Keep the list with you at all times. Show it to your caregiver at every visit. Keep the list up-to-date.   Ask your caregiver or pharmacist to write an explanation  of each medicine you are taking. This should include:   Why you are taking it.   The possible side effects.   The best time of day to take it.   Foods to take with it or what foods to avoid.   When to stop taking it.  MAKE SURE YOU:   Understand these instructions.   Will watch your condition.   Will get help right away if you are not doing well or get worse.  Document Released: 05/01/2005 Document Revised: 01/11/2011 Document Reviewed: 08/13/2009 [facility] Patient Information 2012 [place], [place].   Follow-up Information    Follow up with [name], MD.      Follow up with [name], MD. Make an appointment in 1 week.   Contact information:   [address] [place] [address] [phone]       Follow up with Max Fickle, MD. Make an appointment in 1 week.   Contact information:   [address] [place] [address] [phone]       Follow up with Julien Nordmann, MD. Make an appointment in 1 week.   Contact information:   [address] [place] [address] [phone]          Discharge Medications   Medication List  As of 05/05/2011  1:07 PM   START taking these medications         bisoprolol 5 MG tablet   Commonly known  as: ZEBETA   Take 1 tablet (5 mg total) by mouth daily.      levofloxacin 500 MG tablet   Commonly known as: LEVAQUIN   Take 1 tablet (500 mg total) by mouth daily.      predniSONE 5 MG tablet   Commonly known as: DELTASONE   Take 1 tablet (5 mg total) by mouth daily. Take 10 pills for 1 day, then 8 pills for 1 day, then 6 pills for 1 day, then 4 pills for 1 day, then 2 pills for 1day and stop.         CHANGE how you take these medications         furosemide 40 MG tablet   Commonly known as: LASIX   Take 1 tablet (40 mg total) by mouth daily. Please check your weight daily, and get you BMP checked by Primary MD 1/week for 1 month.   What changed: - medication strength - dose - doctor's instructions         CONTINUE taking these medications         ALPRAZolam 0.25 MG tablet   Commonly known as: XANAX      aspirin 81 MG EC tablet      atorvastatin 10 MG tablet   Commonly known as: LIPITOR   Take 1 tablet (10 mg total) by mouth daily. Brand medically necessary.      calcium carbonate 500 MG chewable tablet   Commonly known as: TUMS - dosed in mg elemental calcium      ciclesonide 160 MCG/ACT inhaler   Commonly known as: ALVESCO      dextromethorphan 30 MG/5ML liquid   Commonly known as: DELSYM      hydrALAZINE 25 MG tablet   Commonly known as: APRESOLINE      HYDROcodone-acetaminophen 7.5-750 MG per tablet   Commonly known as: VICODIN ES      isosorbide dinitrate 30 MG tablet   Commonly known as: ISORDIL   Take 1/2  tablet three times a day.      levalbuterol 1.25 MG/3ML nebulizer solution   Commonly known as: XOPENEX   Take 1.25 mg by nebulization every 4 (four) hours as needed.      magic mouthwash Soln   Take 5 mLs by mouth 4 (four) times daily as needed.      mometasone 50 MCG/ACT nasal spray   Commonly known as: NASONEX      naproxen sodium 220 MG tablet   Commonly known as: ANAPROX      omeprazole 40 MG capsule   Commonly known as: PRILOSEC       potassium chloride 10 MEQ tablet   Commonly known as: K-DUR      VENTOLIN HFA 108 (90 BASE) MCG/ACT inhaler   Generic drug: albuterol      vitamin C 500 MG tablet   Commonly known as: ASCORBIC ACID      Vitamin D3 400 UNITS Caps      zolpidem 5 MG tablet   Commonly known as: AMBIEN          Where to get your medications    These are the prescriptions that you need to pick up.   You may get these medications from any pharmacy.         bisoprolol 5 MG tablet   furosemide 40 MG tablet   levalbuterol 1.25 MG/3ML nebulizer solution   levofloxacin 500 MG tablet   predniSONE 5 MG tablet             Total Time in preparing paper work, data evaluation and todays exam - 35 minutes  Leroy Sea M.D on 05/05/2011,at 1:07 PM  [facility] Office  [phone]

## 2011-05-15 ENCOUNTER — Ambulatory Visit (INDEPENDENT_AMBULATORY_CARE_PROVIDER_SITE_OTHER): Payer: Medicare Other | Admitting: Family Medicine

## 2011-05-15 ENCOUNTER — Encounter: Payer: Self-pay | Admitting: Family Medicine

## 2011-05-15 ENCOUNTER — Ambulatory Visit (INDEPENDENT_AMBULATORY_CARE_PROVIDER_SITE_OTHER)
Admission: RE | Admit: 2011-05-15 | Discharge: 2011-05-15 | Disposition: A | Discharge status: Home / Self Care | Payer: Medicare Other | Source: Ambulatory Visit | Attending: Family Medicine | Admitting: Family Medicine

## 2011-05-15 DIAGNOSIS — I5023 Acute on chronic systolic (congestive) heart failure: Secondary | ICD-10-CM

## 2011-05-15 DIAGNOSIS — R059 Cough, unspecified: Secondary | ICD-10-CM

## 2011-05-15 DIAGNOSIS — R599 Enlarged lymph nodes, unspecified: Secondary | ICD-10-CM

## 2011-05-15 DIAGNOSIS — R0602 Shortness of breath: Secondary | ICD-10-CM

## 2011-05-15 DIAGNOSIS — R590 Localized enlarged lymph nodes: Secondary | ICD-10-CM

## 2011-05-15 DIAGNOSIS — R05 Cough: Secondary | ICD-10-CM

## 2011-05-15 DIAGNOSIS — I959 Hypotension, unspecified: Secondary | ICD-10-CM

## 2011-05-15 DIAGNOSIS — E876 Hypokalemia: Secondary | ICD-10-CM

## 2011-05-15 DIAGNOSIS — D649 Anemia, unspecified: Secondary | ICD-10-CM

## 2011-05-15 LAB — COMPREHENSIVE METABOLIC PANEL
ALT: 23 U/L (ref 0–35)
AST: 15 U/L (ref 0–37)
Albumin: 4.4 g/dL (ref 3.5–5.2)
Alkaline Phosphatase: 101 U/L (ref 39–117)
BUN: 24 mg/dL — ABNORMAL HIGH (ref 6–23)
CO2: 28 mEq/L (ref 19–32)
Calcium: 9.5 mg/dL (ref 8.4–10.5)
Chloride: 99 mEq/L (ref 96–112)
Creat: 0.93 mg/dL (ref 0.50–1.10)
Glucose, Bld: 109 mg/dL — ABNORMAL HIGH (ref 70–99)
Potassium: 4.5 mEq/L (ref 3.5–5.3)
Sodium: 139 mEq/L (ref 135–145)
Total Bilirubin: 0.3 mg/dL (ref 0.3–1.2)
Total Protein: 6.3 g/dL (ref 6.0–8.3)

## 2011-05-15 NOTE — Patient Instructions (Addendum)
Good to see you today! You are doing well today. Blood work today, as well as chest xray. We will call you with results. Return on friday for repeat blood work.

## 2011-05-15 NOTE — Progress Notes (Signed)
  Subjective:    Patient ID: Ashley [name], female    DOB: 1945-11-15, 65 y.o.   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  Placed on lasix as well as steroids and levaquin with good recovery.  pulm and cards followed while in hospital, pulm did not think bronchiectasis was playing significant role in wheezing/dyspnea, attributed more to CHF.    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  Overall feeling doing better, still some fatigue.  Still no ACEI/ARB 2/2 h/o coughing, but may be started on this.  Sent home with home O2 at 2L [place] 2/2 pulse ox dropping to 86% RA on ambulation and up to 92% at rest with O2, also given desats below 88% on RA at night.  Feels still needs O2 otherwise increased dyspnea.  Per hospitalist, rec consider CHF clinic.  MBS in hospital without any evidence of aspiration.  VCD - has appt in May 2013 scheduled at [facility] VCD clinic. Goal weight is 150-154lb.  Voiding fine.  Watching salt intake very closely, making broth from scratch, not using storebought. Back and chest aches occasionally.  SOB improved.  Still coughing, but improved.  Sleeping some nights ok, some nights bad.  Not a lot of leg swelling.  Has felt too dry with lasix at 20mg  bid so has tapered down to 10mg  bid and feels tolerates this better.  Also ran out of potassium supplementation today, wonders if needs to continue.  Brings log of blood pressures - ranging 84-124/50-80s.  has held BP meds with low blood pressures.  To start respiratory therapy this coming week.  On continual O2 at 2L [place].  Reviewed records.  Due for recheck anemia, CXR, and potassium and Cr.  Off inhalers and breathing treatments.  States no h/o genital herpes so will remove from chart, no h/o  milk intolerance.  Has appt with Dr. Mariah Milling 1/2 and with Dr. Kendrick Fries 1/9.  To follow up with pulm re bronchiectasis and desats.  Not currently using ambien, continues using xanax.  Review of Systems Per HPI    Objective:   Physical Exam  Vitals reviewed. Constitutional: She appears well-developed and well-nourished. No distress.  HENT:  Head: Normocephalic.  Mouth/Throat: Oropharynx is clear and moist. No oropharyngeal exudate.  Eyes: Conjunctivae and EOM are normal. Pupils are equal, round, and reactive to light. No scleral icterus.  Neck: Normal range of motion. Neck supple. No JVD present. Carotid bruit is not present. No thyromegaly present.  Cardiovascular: Normal rate, regular rhythm, normal heart sounds and intact distal pulses.  Exam reveals no gallop.   No murmur heard. Pulmonary/Chest: Effort normal and breath sounds normal. No respiratory distress. She has no wheezes. She has no rales.       No crackles  Musculoskeletal: She exhibits no edema.  Lymphadenopathy:    She has no cervical adenopathy.  Skin: Skin is warm and dry. No rash noted.  Psychiatric: She has a normal mood and affect.       pleasant       Assessment & Plan:

## 2011-05-16 ENCOUNTER — Encounter: Payer: Self-pay | Admitting: Family Medicine

## 2011-05-16 DIAGNOSIS — D649 Anemia, unspecified: Secondary | ICD-10-CM | POA: Insufficient documentation

## 2011-05-16 LAB — CBC WITH DIFFERENTIAL/PLATELET
Basophils Absolute: 0 10*3/uL (ref 0.0–0.1)
Basophils Relative: 0 % (ref 0–1)
Eosinophils Absolute: 0.4 10*3/uL (ref 0.0–0.7)
Eosinophils Relative: 2 % (ref 0–5)
HCT: 40.5 % (ref 36.0–46.0)
Hemoglobin: 12.8 g/dL (ref 12.0–15.0)
Lymphocytes Relative: 21 % (ref 12–46)
Lymphs Abs: 3.3 10*3/uL (ref 0.7–4.0)
MCH: 27.9 pg (ref 26.0–34.0)
MCHC: 31.6 g/dL (ref 30.0–36.0)
MCV: 88.4 fL (ref 78.0–100.0)
Monocytes Absolute: 1.3 10*3/uL — ABNORMAL HIGH (ref 0.1–1.0)
Monocytes Relative: 8 % (ref 3–12)
Neutro Abs: 10.7 10*3/uL — ABNORMAL HIGH (ref 1.7–7.7)
Neutrophils Relative %: 68 % (ref 43–77)
Platelets: 299 10*3/uL (ref 150–400)
RBC: 4.58 MIL/uL (ref 3.87–5.11)
RDW: 14 % (ref 11.5–15.5)
WBC: 15.7 10*3/uL — ABNORMAL HIGH (ref 4.0–10.5)

## 2011-05-16 NOTE — Assessment & Plan Note (Signed)
Mild during hospitalization.  Recheck today.

## 2011-05-16 NOTE — Assessment & Plan Note (Addendum)
This was found on recent CT angio that was negative for PE.  Borderline enlarged R hilar and mediastinal adenopathy with bronchiectasis and interstitial lung disease.  rec rpt in 3-6 mo.  Will place order in chart to have done in 3 mo.  Has had 2 CT scans in last few months at [facility] (12/2010 and 02/2011).

## 2011-05-16 NOTE — Assessment & Plan Note (Signed)
Resolving well.  Has f/u with cardiology Dr. Mariah Milling scheduled. seems to be tolerating lasix 10mg  bid well, has finished potassium today. Will check Cr/K today and recheck on Friday to see if potassium supplementation still needed with lower lasix dose (mild hypokalemia in hospital). Has been keeping close track of blood pressures (with some hypotensive) and weight, limiting salt intake. Appreciate cards assistance with pt.

## 2011-05-16 NOTE — Assessment & Plan Note (Signed)
BP Readings from Last 3 Encounters:  05/15/11 114/64  05/05/11 130/78  04/26/11 112/70  at home some lows.  But seems to be tolerating lasix dose currently so no changes.

## 2011-05-16 NOTE — Assessment & Plan Note (Addendum)
Likely attributable to CHF, as exac resolving, dyspnea also improving. Continue O2 for now, with near reassessment.  Pt currently feels more comfortable on O2 by [place] (recommended nasal saline for dry nasal epithelium). Check CXR today to follow atx vs ?consolidation - clear on my read, improved cardiomegaly as well.  No effusions.

## 2011-05-17 ENCOUNTER — Ambulatory Visit (INDEPENDENT_AMBULATORY_CARE_PROVIDER_SITE_OTHER): Payer: Medicare Other | Admitting: Cardiovascular Disease

## 2011-05-17 ENCOUNTER — Encounter: Payer: Self-pay | Admitting: Cardiovascular Disease

## 2011-05-17 DIAGNOSIS — I959 Hypotension, unspecified: Secondary | ICD-10-CM

## 2011-05-17 DIAGNOSIS — M9981 Other biomechanical lesions of cervical region: Secondary | ICD-10-CM | POA: Diagnosis not present

## 2011-05-17 DIAGNOSIS — R059 Cough, unspecified: Secondary | ICD-10-CM

## 2011-05-17 DIAGNOSIS — M5030 Other cervical disc degeneration, unspecified cervical region: Secondary | ICD-10-CM | POA: Diagnosis not present

## 2011-05-17 DIAGNOSIS — I340 Nonrheumatic mitral (valve) insufficiency: Secondary | ICD-10-CM

## 2011-05-17 DIAGNOSIS — R0602 Shortness of breath: Secondary | ICD-10-CM

## 2011-05-17 DIAGNOSIS — M999 Biomechanical lesion, unspecified: Secondary | ICD-10-CM | POA: Diagnosis not present

## 2011-05-17 DIAGNOSIS — I428 Other cardiomyopathies: Secondary | ICD-10-CM | POA: Diagnosis not present

## 2011-05-17 DIAGNOSIS — R079 Chest pain, unspecified: Secondary | ICD-10-CM | POA: Diagnosis not present

## 2011-05-17 DIAGNOSIS — M5137 Other intervertebral disc degeneration, lumbosacral region: Secondary | ICD-10-CM | POA: Diagnosis not present

## 2011-05-17 DIAGNOSIS — I059 Rheumatic mitral valve disease, unspecified: Secondary | ICD-10-CM

## 2011-05-17 DIAGNOSIS — R05 Cough: Secondary | ICD-10-CM

## 2011-05-17 MED ORDER — METOLAZONE 2.5 MG PO TABS: 2.5000 mg | ORAL_TABLET | Freq: Every day | ORAL | Status: DC | PRN

## 2011-05-17 NOTE — Assessment & Plan Note (Signed)
I suggested to her that we continue to run her blood pressure low though she does have some fatigue. We will hold the isosorbide Dose for now. If she continues to have symptoms, we could change the isosorbide to 30 mg in the p.m., and possibly hold the noon dose of hydralazine

## 2011-05-17 NOTE — Progress Notes (Signed)
Patient ID: Ashley [name], female    DOB: May 31, 1945, 66 y.o.   MRN: [id number]  HPI Comments: 66 y/o female with history of pneumonia, chronic cough, diagnosis of COPD made recently during a hospital course at [facility], recent shortness of breath since September 2012 that presented acutely on September 1, CT scan showing progressive pleural effusion on a repeat scan , echo showing Cardiomyopathy  ejection fraction estimated at 25-35%,  Previous  [employer]  showed no significant ischemia, no EKG changes concerning for ischemia.  She had a recent admission to [facility] in December for bronchitis and hypoxia as well as systolic CHF. She had antibodies, prednisone and diuresis and has felt better since discharge.  She reports that she continues to need oxygen, her cough is significantly better. Her weight seems to have stabilized close to 150 pounds and she is not taking much Lasix, only 10 mg b.i.d. As higher doses cause dry mouth and sore throat. She does report having periods of low blood pressure, sometimes with systolic pressures into the 80s with some malaise.  Echocardiogram on December 18 showed moderate pulmonary hypertension, ejection fraction 25%.  She has not had prednisone over the past 5 days. White blood cell count is still elevated at 15.7. She has had significantly less sputum since her initial admission to [facility] long hospital.  EKG shows normal sinus rhythm with rate 67 beats per minute with no significant ST or T wave changes       Outpatient Encounter Prescriptions as of 05/17/2011  Medication Sig Dispense Refill  . ALPRAZolam (XANAX) 0.25 MG tablet Take 0.25 mg by mouth 2 (two) times daily. Takes half a pill prn      . Alum & Mag Hydroxide-Simeth (MAGIC MOUTHWASH) SOLN Take 5 mLs by mouth 4 (four) times daily as needed.  120 mL  0  . aspirin 81 MG EC tablet Take 81 mg by mouth at bedtime.       Marland Kitchen atorvastatin (LIPITOR) 10 MG tablet Take 1 tablet (10 mg  total) by mouth daily. Brand medically necessary.  30 tablet  11  . bisoprolol (ZEBETA) 5 MG tablet Take 1 tablet (5 mg total) by mouth daily.  30 tablet  0  . calcium carbonate (TUMS - DOSED IN MG ELEMENTAL CALCIUM) 500 MG chewable tablet Chew 1 tablet by mouth daily. heartburn       . Cholecalciferol (VITAMIN D3) 400 UNITS CAPS Take 2 capsules by mouth daily.       . ciclesonide (ALVESCO) 160 MCG/ACT inhaler Inhale 1 puff into the lungs 2 (two) times daily.        Marland Kitchen dextromethorphan (DELSYM) 30 MG/5ML liquid 2 tsp every 12 hours as needed       . furosemide (LASIX) 20 MG tablet Take 0.5 tablets (10 mg total) by mouth 2 (two) times daily. Please check your weight daily, and get you BMP checked by Primary MD 1/week for 1 month.      . hydrALAZINE (APRESOLINE) 25 MG tablet Take 12.5 mg by mouth 3 (three) times daily.        Marland Kitchen HYDROcodone-acetaminophen (VICODIN ES) 7.5-750 MG per tablet Take 1 tablet by mouth every 6 (six) hours as needed. pain      . isosorbide dinitrate (ISORDIL) 30 MG tablet Take 1/2 tablet three times a day.  45 tablet  3  . levalbuterol (XOPENEX) 1.25 MG/3ML nebulizer solution Take 1.25 mg by nebulization every 4 (four) hours as needed.  90  mL  5  . mometasone (NASONEX) 50 MCG/ACT nasal spray Place 1 spray into the nose 2 (two) times daily.       . naproxen sodium (ANAPROX) 220 MG tablet Per bottle      . omeprazole (PRILOSEC) 40 MG capsule Take 40 mg by mouth 2 (two) times daily.        . potassium chloride (K-DUR) 10 MEQ tablet Take 10 mEq by mouth 2 (two) times daily.        . VENTOLIN HFA 108 (90 BASE) MCG/ACT inhaler Take 2 puffs by mouth Every 4 hours as needed.      . vitamin C (ASCORBIC ACID) 500 MG tablet Take 500 mg by mouth daily.        Marland Kitchen zolpidem (AMBIEN) 5 MG tablet 1/2 to 1 tablet at bedtime as needed For insomnia        . metolazone (ZAROXOLYN) 2.5 MG tablet Take 1 tablet (2.5 mg total) by mouth daily as needed.  30 tablet  6     Review of Systems    Constitutional: Positive for fatigue.       On 2 L nasal cannula oxygen  HENT: Negative.   Eyes: Negative.   Respiratory: Positive for shortness of breath.   Cardiovascular: Negative.   Gastrointestinal: Negative.   Musculoskeletal: Negative.   Skin: Negative.   Neurological: Negative.   Hematological: Negative.   Psychiatric/Behavioral: Negative.   All other systems reviewed and are negative.     BP 100/60  Pulse 67  Ht 5\' 5"  (1.651 m)  Wt 153 lb 4 oz (69.514 kg)  BMI 25.50 kg/m2  Physical Exam  Nursing note and vitals reviewed. Constitutional: She is oriented to person, place, and time. She appears well-developed and well-nourished.  HENT:  Head: Normocephalic.  Nose: Nose normal.  Mouth/Throat: Oropharynx is clear and moist.  Eyes: Conjunctivae are normal. Pupils are equal, round, and reactive to light.  Neck: Normal range of motion. Neck supple. No JVD present.  Cardiovascular: Normal rate, regular rhythm, S1 normal, S2 normal, normal heart sounds and intact distal pulses.  Exam reveals no gallop and no friction rub.   No murmur heard. Pulmonary/Chest: Effort normal and breath sounds normal. No respiratory distress. She has no wheezes. She has no rales. She exhibits no tenderness.  Abdominal: Soft. Bowel sounds are normal. She exhibits no distension. There is no tenderness.  Musculoskeletal: Normal range of motion. She exhibits no edema and no tenderness.  Lymphadenopathy:    She has no cervical adenopathy.  Neurological: She is alert and oriented to person, place, and time. Coordination normal.  Skin: Skin is warm and dry. No rash noted. No erythema.  Psychiatric: She has a normal mood and affect. Her behavior is normal. Judgment and thought content normal.         Assessment and Plan

## 2011-05-17 NOTE — Assessment & Plan Note (Signed)
Cough is significantly better after Levaquin, steroids and on oxygen

## 2011-05-17 NOTE — Assessment & Plan Note (Signed)
Previous echocardiogram 2 weeks ago suggests moderate MR appeared this may improve with additional gentle diuresis.

## 2011-05-17 NOTE — Assessment & Plan Note (Signed)
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 She continues to have mild to moderate fluid overload by echocardiogram 2 weeks ago with no significant change in her weight. She has difficulty tolerating higher dose Lasix. Recent BMP shows stable renal function. I suspect her goal weight will be less than 150. We will aim for 145-148 pounds. We have suggested she try metolazone 2.5 mg prior to her a.m. Lasix on a periodic basis with close monitoring of her weight

## 2011-05-17 NOTE — Patient Instructions (Addendum)
You are doing well. Stop the noon dose of the isosorbide Goal weight is 145 to 148 Take metolazone as needed 30 minutes before lasix in the morning for diuresis  Please call us if you have new issues that need to be addressed before your next appt.  Your physician wants you to follow-up in: 1 months.  You will receive a reminder letter in the mail two months in advance. If you don't receive a letter, please call our office to schedule the follow-up appointment.  Types of oxygen tank include concentrated oxygen, also a small portable oxygen generator

## 2011-05-19 ENCOUNTER — Other Ambulatory Visit (INDEPENDENT_AMBULATORY_CARE_PROVIDER_SITE_OTHER): Payer: Medicare Other

## 2011-05-19 ENCOUNTER — Telehealth: Payer: Self-pay | Admitting: Family Medicine

## 2011-05-19 DIAGNOSIS — E876 Hypokalemia: Secondary | ICD-10-CM

## 2011-05-19 LAB — BASIC METABOLIC PANEL
BUN: 20 mg/dL (ref 6–23)
CO2: 30 mEq/L (ref 19–32)
Calcium: 9.2 mg/dL (ref 8.4–10.5)
Chloride: 105 mEq/L (ref 96–112)
Creatinine, Ser: 0.9 mg/dL (ref 0.4–1.2)
GFR: 66.62 mL/min (ref >60.00)
Glucose, Bld: 136 mg/dL — ABNORMAL HIGH (ref 70–99)
Potassium: 3.8 mEq/L (ref 3.5–5.1)
Sodium: 142 mEq/L (ref 135–145)

## 2011-05-19 NOTE — Telephone Encounter (Signed)
please notify kidney function and potassium are stable. I would like her to return in 1 wk for recheck to ensure staying good range. Placed order in chart.

## 2011-05-19 NOTE — Telephone Encounter (Signed)
Informed patient of results

## 2011-05-23 ENCOUNTER — Encounter: Payer: Self-pay | Admitting: Pulmonary Disease

## 2011-05-23 DIAGNOSIS — I509 Heart failure, unspecified: Secondary | ICD-10-CM | POA: Diagnosis not present

## 2011-05-23 DIAGNOSIS — Z5189 Encounter for other specified aftercare: Secondary | ICD-10-CM | POA: Diagnosis not present

## 2011-05-23 DIAGNOSIS — R0609 Other forms of dyspnea: Secondary | ICD-10-CM | POA: Diagnosis not present

## 2011-05-24 ENCOUNTER — Ambulatory Visit (INDEPENDENT_AMBULATORY_CARE_PROVIDER_SITE_OTHER): Payer: Medicare Other | Admitting: Pulmonary Disease

## 2011-05-24 DIAGNOSIS — M5137 Other intervertebral disc degeneration, lumbosacral region: Secondary | ICD-10-CM | POA: Diagnosis not present

## 2011-05-24 DIAGNOSIS — R599 Enlarged lymph nodes, unspecified: Secondary | ICD-10-CM

## 2011-05-24 DIAGNOSIS — M9981 Other biomechanical lesions of cervical region: Secondary | ICD-10-CM | POA: Diagnosis not present

## 2011-05-24 DIAGNOSIS — M999 Biomechanical lesion, unspecified: Secondary | ICD-10-CM | POA: Diagnosis not present

## 2011-05-24 DIAGNOSIS — M5030 Other cervical disc degeneration, unspecified cervical region: Secondary | ICD-10-CM | POA: Diagnosis not present

## 2011-05-24 DIAGNOSIS — J479 Bronchiectasis, uncomplicated: Secondary | ICD-10-CM

## 2011-05-24 DIAGNOSIS — R590 Localized enlarged lymph nodes: Secondary | ICD-10-CM

## 2011-05-24 NOTE — Assessment & Plan Note (Signed)
Much improved after diuresis, steroids and antibiotics.  Currently off inhalers which is OK by me.  Continue sinus toilette as she is doing.

## 2011-05-24 NOTE — Progress Notes (Signed)
Subjective:    Patient ID: Ashley [name], female    DOB: 07-17-1945, 66 y.o.   MRN: [id number]  Synopsis: 66 y/o female with chronic cough, intermittent wheezing, and multiple hospitalizations for pneumonia presented to our clinic initially on 02/20/11 for evaluation of the same. She has seen an allergist for years for allergic rhinitis and had been told that she did not have asthma.  We ordered an echocardiogram which showed significanly decreased LV function.  She underwent a cardiac myoview which showed no evidence of ischaemia.  She has been seen in our [place] office multiple times for cough since.  She has not taken the chlortrimeton we recommended.  She was seen by a gastroenterologist in the interim who recommended that she continue taking pepcid and famotidine.  In approximately the end of 10/12 she was put on asthmanex by her allergist.  On 11/12 she was given a prenisone taper for continued cough.  04/03/11  ROV --Ashley [name] has had a busy 6 weeks since our last visit as detailed above.  She has multiple complaints today, but her primary respiratory complaints are wheezing, shortness of breath, and continuous cough productive of clear sputum.  She says that the symptoms are worse at night when she lies flat, so she has to prop her head up.  She is a little more swollen than usual.  She says that the tramadol makes her stomach hurt.  She doesn't think that the pepcid helps.  Her allergist put her on Asthmanex but she doesn't think that it has helped.  She was put on a prednisone taper last week and she says that this has not helped her wheeze or shortness of breath at all.  She wheezes nearly continuously.  No fevers or chills.    She notes that she frequently wakes up many times during the night choking and gasping for air.  Her husband says that she stops breathing at times.  She also notes waking up choking frequently in the middle of the night.    Her sinuses have been well controlled  recently.   04/26/11 ROV -- Ashley [name] returns for further evaluation of shortness of breath and cough.  She states that she has had chills at home in the last week, associated with sorethroat, cough, wheezing (especially at night), clear sputum production, and chest pain with cough.  She says that she has woken up multiple times at night with wheezing which is relieved with albuterol.    She stopped taking the bystolic and feels better now (less short of breath).  She has also been taking more furosemide per Dr. Windell Hummingbird instructions.  05/24/11 ROV/Hosp f/u-- She reports that she is doing fairly well after her recent hospitalization for volume overload and cough.  Her cough has improved quite a bit since then and she is not as short of breath since starting on oxygen.  She only has scant clear sputum production.  She notes that she does not urinate much on her current dose of lasix.  She is taking the lasix and metolazone as written.  She stopped taking the alvesco and nebulized meds.   ROS:  Gen: positive for fatigue, no fevers or chills PULM: as per HPI, improved cough and shortness of breath, no wheezing CV: swelling and weight gain noted, no chest pain Derm: notes an itching rash on back    Objective:   Physical Exam  Filed Vitals:   05/24/11 1409  BP: 90/58  Pulse: 72  Temp: 97.9  F (36.6 C)  TempSrc: Oral  Height: 5' 5.5" (1.664 m)  Weight: 153 lb 6.4 oz (69.582 kg)  SpO2: 98%    Gen: sitting up, pleasant mood, no acute distress HEENT: NCAT, PERRL, EOMi, OP clear, Neck supple without masses, no lymphadenopathy but some mild tenderness left submandibular area PULM: Few insp crackles L base > R, no wheeze GEN: RRR, systolic murmur noted, no JVD AB: BS+, soft, nontender, no hsm Ext: warm, no clubbing, no cyanosis, some pitting edema to mid shin Derm: no rash or skin breakdown  04/26/11 CXR: bronchial thickening, no infiltrate  04/26/11 Rapid flu: negative  04/03/11 In  office spirometry: Good effort, F/F ratio 65%, FEV1 1.68 L (69%); flow volume loop with scooping consistent with moderate obstruction  02/24/11 TTE: LVEF 25-30%, Mod severe MR, cannot assess RVSP    Assessment & Plan:    Mediastinal lymphadenopathy This is the most worrisome finding from her recent hospitalization.  Based on the acute change, I think that this is most likely an infectious process or related to her volume overload.  We will re-schedule her repeat CT chest for 06/13/11 AM.  There was a question of ILD on the 12/12 CT chest but I think this was related to volume overload and not ILD.  Bronchiectasis Very mild by CT imaging.  She does not have typical symptoms of chronic severe bronchiectasis, such as chronic daily cough.   Modified barium swallow was negative in 12/12 hospitalization.  Given lack of symptoms and mild findings on CT, no indication for chronic therapy but would be quick to treat respiratory tract infections in the future with antibiotics, particularly Levaquin or other quinolones.  Cough Much improved after diuresis, steroids and antibiotics.  Currently off inhalers which is OK by me.  Continue sinus toilette as she is doing.  Cardiomyopathy She is volume up today, weight 153 lbs and more edema on exam.  Dr. Windell Hummingbird goal for her is about 145.  I advised her to double the afternoon dose of lasix to 20 mg until her weight comes back down around 145, then resume the current dosing.  Follow up with Dr. Mariah Milling as scheduled.     Updated Medication List Outpatient Encounter Prescriptions as of 05/24/2011  Medication Sig Dispense Refill  . ALPRAZolam (XANAX) 0.25 MG tablet Take 0.25 mg by mouth 2 (two) times daily. Takes half a pill prn      . aspirin 81 MG EC tablet Take 81 mg by mouth at bedtime.       Marland Kitchen atorvastatin (LIPITOR) 10 MG tablet Take 1 tablet (10 mg total) by mouth daily. Brand medically necessary.  30 tablet  11  . bisoprolol (ZEBETA) 5 MG tablet Take  1 tablet (5 mg total) by mouth daily.  30 tablet  0  . calcium carbonate (TUMS - DOSED IN MG ELEMENTAL CALCIUM) 500 MG chewable tablet Chew 1 tablet by mouth daily. heartburn       . Cholecalciferol (VITAMIN D3) 400 UNITS CAPS Take 2 capsules by mouth daily.       Marland Kitchen dextromethorphan (DELSYM) 30 MG/5ML liquid 2 tsp every 12 hours as needed       . furosemide (LASIX) 20 MG tablet Take 0.5 tablets (10 mg total) by mouth 2 (two) times daily. Please check your weight daily, and get you BMP checked by Primary MD 1/week for 1 month.      . hydrALAZINE (APRESOLINE) 25 MG tablet Take 12.5 mg by mouth 3 (three) times daily.        Marland Kitchen  HYDROcodone-acetaminophen (VICODIN ES) 7.5-750 MG per tablet Take 1 tablet by mouth every 6 (six) hours as needed. pain      . isosorbide dinitrate (ISORDIL) 30 MG tablet Take 1/2 tablet twice daily      . metolazone (ZAROXOLYN) 2.5 MG tablet Take 1 tablet (2.5 mg total) by mouth daily as needed.  30 tablet  6  . mometasone (NASONEX) 50 MCG/ACT nasal spray Place 1 spray into the nose 2 (two) times daily.       . naproxen sodium (ANAPROX) 220 MG tablet Per bottle      . nystatin (MYCOSTATIN) 100000 UNIT/ML suspension As directed when needed      . omeprazole (PRILOSEC) 40 MG capsule Take 40 mg by mouth 2 (two) times daily.        . vitamin C (ASCORBIC ACID) 500 MG tablet Take 500 mg by mouth daily.        Marland Kitchen zolpidem (AMBIEN) 5 MG tablet 1/2 to 1 tablet at bedtime as needed For insomnia        . DISCONTD: isosorbide dinitrate (ISORDIL) 30 MG tablet Take 1/2 tablet three times a day.  45 tablet  3  . potassium chloride (K-DUR) 10 MEQ tablet Take 10 mEq by mouth 2 (two) times daily.        Marland Kitchen DISCONTD: Alum & Mag Hydroxide-Simeth (MAGIC MOUTHWASH) SOLN Take 5 mLs by mouth 4 (four) times daily as needed.  120 mL  0  . DISCONTD: ciclesonide (ALVESCO) 160 MCG/ACT inhaler Inhale 1 puff into the lungs 2 (two) times daily.        Marland Kitchen DISCONTD: levalbuterol (XOPENEX) 1.25 MG/3ML nebulizer  solution Take 1.25 mg by nebulization every 4 (four) hours as needed.  90 mL  5  . DISCONTD: VENTOLIN HFA 108 (90 BASE) MCG/ACT inhaler Take 2 puffs by mouth Every 4 hours as needed.

## 2011-05-24 NOTE — Assessment & Plan Note (Addendum)
This is the most worrisome finding from her recent hospitalization.  Based on the acute change, I think that this is most likely an infectious process or related to her volume overload.  We will re-schedule her repeat CT chest for 06/13/11 AM.  There was a question of ILD on the 12/12 CT chest but I think this was related to volume overload and not ILD.

## 2011-05-24 NOTE — Patient Instructions (Signed)
We will reschedule your CT Chest without contrast for 06/13/11 in the morning. Take 20mg  of lasix as your afternoon dose until your weight comes back down to 143-145lbs. Follow up with pulmonary rehab. We will see you back in 2 months.

## 2011-05-24 NOTE — Assessment & Plan Note (Addendum)
Very mild by CT imaging.  She does not have typical symptoms of chronic severe bronchiectasis, such as chronic daily cough.   Modified barium swallow was negative in 12/12 hospitalization.  Given lack of symptoms and mild findings on CT, no indication for chronic therapy but would be quick to treat respiratory tract infections in the future with antibiotics, particularly Levaquin or other quinolones.

## 2011-05-24 NOTE — Assessment & Plan Note (Signed)
She is volume up today, weight 153 lbs and more edema on exam.  Dr. Windell Hummingbird goal for her is about 145.  I advised her to double the afternoon dose of lasix to 20 mg until her weight comes back down around 145, then resume the current dosing.  Follow up with Dr. Mariah Milling as scheduled.

## 2011-05-25 ENCOUNTER — Encounter: Payer: Self-pay | Admitting: Family Medicine

## 2011-05-25 ENCOUNTER — Telehealth: Payer: Self-pay | Admitting: Family Medicine

## 2011-05-25 NOTE — Telephone Encounter (Signed)
Rose from [place] [facility] called to say that the pt saw Dr Kendrick Fries and he ordered a CT Chest to be done sooner than the one you had ordered for March 2013. She will go to [facility] on 06/13/2011. I cancelled your CT for March 2013. Let me know if we need to reschedule another one for the future or if Dr Kendrick Fries will be the one to monitor this. MK

## 2011-05-25 NOTE — Telephone Encounter (Signed)
Noted.  Will not reschedule for now.

## 2011-05-26 ENCOUNTER — Other Ambulatory Visit (INDEPENDENT_AMBULATORY_CARE_PROVIDER_SITE_OTHER): Payer: Medicare Other

## 2011-05-26 ENCOUNTER — Telehealth: Payer: Self-pay | Admitting: Family Medicine

## 2011-05-26 DIAGNOSIS — E876 Hypokalemia: Secondary | ICD-10-CM

## 2011-05-26 LAB — BASIC METABOLIC PANEL
BUN: 34 mg/dL — ABNORMAL HIGH (ref 6–23)
CO2: 34 mEq/L — ABNORMAL HIGH (ref 19–32)
Calcium: 10.3 mg/dL (ref 8.4–10.5)
Chloride: 97 mEq/L (ref 96–112)
Creatinine, Ser: 1.1 mg/dL (ref 0.4–1.2)
GFR: 54.56 mL/min — ABNORMAL LOW (ref >60.00)
Glucose, Bld: 102 mg/dL — ABNORMAL HIGH (ref 70–99)
Potassium: 3.1 mEq/L — ABNORMAL LOW (ref 3.5–5.1)
Sodium: 141 mEq/L (ref 135–145)

## 2011-05-26 MED ORDER — POTASSIUM CHLORIDE ER 10 MEQ PO TBCR: 10.0000 meq | EXTENDED_RELEASE_TABLET | Freq: Two times a day (BID) | ORAL | Status: DC

## 2011-05-26 NOTE — Telephone Encounter (Signed)
Patient notified. Advised new Rx sent in and to call with any questions or concerns.

## 2011-05-26 NOTE — Telephone Encounter (Signed)
Please notify blood work from this morning showing potassium again a bit low.  i did notice that Dr. Kendrick Fries had recently increased lasix dose, so I would like her to take potassium twice daily when taking higher lasix dose or days taking metolazone.  i have sent this in.

## 2011-05-28 ENCOUNTER — Telehealth: Payer: Self-pay | Admitting: Family Medicine

## 2011-05-28 DIAGNOSIS — E876 Hypokalemia: Secondary | ICD-10-CM

## 2011-05-28 NOTE — Telephone Encounter (Signed)
I would like her to come in this wednesday or Thursday to recheck bmp.  Order in chart.

## 2011-05-29 NOTE — Telephone Encounter (Signed)
Message left on home phone to call and schedule labs for Wednesday or Thursday.

## 2011-05-30 ENCOUNTER — Telehealth: Payer: Self-pay | Admitting: Internal Medicine

## 2011-05-30 DIAGNOSIS — D649 Anemia, unspecified: Secondary | ICD-10-CM

## 2011-05-30 NOTE — Telephone Encounter (Signed)
Patient would like if you could also check for anemia when she comes in for her bloodwork on Thursday.  She feels like she is going back to that stage.  She is sleeping and her nose is bleeding everyday would like to have it put on the order.

## 2011-05-30 NOTE — Telephone Encounter (Signed)
Have added. Ok to do. plz notify.

## 2011-05-31 DIAGNOSIS — M999 Biomechanical lesion, unspecified: Secondary | ICD-10-CM | POA: Diagnosis not present

## 2011-05-31 DIAGNOSIS — M9981 Other biomechanical lesions of cervical region: Secondary | ICD-10-CM | POA: Diagnosis not present

## 2011-05-31 DIAGNOSIS — M5137 Other intervertebral disc degeneration, lumbosacral region: Secondary | ICD-10-CM | POA: Diagnosis not present

## 2011-05-31 DIAGNOSIS — M5030 Other cervical disc degeneration, unspecified cervical region: Secondary | ICD-10-CM | POA: Diagnosis not present

## 2011-05-31 NOTE — Telephone Encounter (Signed)
Message left notifying patient.

## 2011-06-01 ENCOUNTER — Other Ambulatory Visit (INDEPENDENT_AMBULATORY_CARE_PROVIDER_SITE_OTHER): Payer: Medicare Other

## 2011-06-01 DIAGNOSIS — D649 Anemia, unspecified: Secondary | ICD-10-CM

## 2011-06-01 DIAGNOSIS — E876 Hypokalemia: Secondary | ICD-10-CM | POA: Diagnosis not present

## 2011-06-01 LAB — BASIC METABOLIC PANEL
BUN: 38 mg/dL — ABNORMAL HIGH (ref 6–23)
CO2: 34 mEq/L — ABNORMAL HIGH (ref 19–32)
Calcium: 10.1 mg/dL (ref 8.4–10.5)
Chloride: 94 mEq/L — ABNORMAL LOW (ref 96–112)
Creatinine, Ser: 1.2 mg/dL (ref 0.4–1.2)
GFR: 49.7 mL/min — ABNORMAL LOW (ref >60.00)
Glucose, Bld: 107 mg/dL — ABNORMAL HIGH (ref 70–99)
Potassium: 3.6 mEq/L (ref 3.5–5.1)
Sodium: 138 mEq/L (ref 135–145)

## 2011-06-01 LAB — HEMOGLOBIN: Hemoglobin: 12.7 g/dL (ref 12.0–15.0)

## 2011-06-02 ENCOUNTER — Other Ambulatory Visit: Payer: Medicare Other

## 2011-06-05 ENCOUNTER — Other Ambulatory Visit: Payer: Self-pay | Admitting: *Deleted

## 2011-06-05 MED ORDER — ALPRAZOLAM 0.25 MG PO TABS: 0.2500 mg | ORAL_TABLET | Freq: Two times a day (BID) | ORAL | Status: DC

## 2011-06-05 NOTE — Telephone Encounter (Signed)
plz phone in. 

## 2011-06-05 NOTE — Telephone Encounter (Signed)
Rx called in as directed.   

## 2011-06-05 NOTE — Telephone Encounter (Signed)
OK to refill

## 2011-06-06 ENCOUNTER — Telehealth: Payer: Self-pay | Admitting: *Deleted

## 2011-06-06 ENCOUNTER — Telehealth: Payer: Self-pay

## 2011-06-06 MED ORDER — BISOPROLOL FUMARATE 5 MG PO TABS: 5.0000 mg | ORAL_TABLET | Freq: Every day | ORAL | Status: DC

## 2011-06-06 NOTE — Telephone Encounter (Signed)
Was her BP low or is it running low? Any coughing or did she stand up before fainting? She probably needs an appt with [facility] in  to consider ICD placement for nonischemic cardiomyopathy.

## 2011-06-06 NOTE — Telephone Encounter (Signed)
Pt needs a refill on zebeta [facility] [address] [address].  She is out of this and doesn't know if Dr. Kendrick Fries or her cardilogist prescribed it.  She also wants you to know that she blacked out yesterday.

## 2011-06-06 NOTE — Telephone Encounter (Signed)
The patient blacked out yesterday about 5:30 pm for about 1 1/2 minutes, she had "enough since" to get to some orange juice and cheese and she laid on the floor for 20 minutes and ate the cheese until she could stand up.  She ran out of the bisoprolol 5 mg one tablet daily yesterday not sure if blood sugar dropped or what. [facility] call [facility]. She was out of the bisoprolol yesterday. "She had her first physical therapy work out yesterday not sure if that is what caused the black out." Please advise if patient should be seen sooner than Jan. 30, 2013.

## 2011-06-06 NOTE — Telephone Encounter (Signed)
Ms. Ashley [name] is doing better today she is just concerned and not sure who to contact regarding this "blackout" episode yesterday.

## 2011-06-07 ENCOUNTER — Ambulatory Visit (INDEPENDENT_AMBULATORY_CARE_PROVIDER_SITE_OTHER): Payer: Medicare Other | Admitting: Internal Medicine

## 2011-06-07 ENCOUNTER — Encounter: Payer: Self-pay | Admitting: Internal Medicine

## 2011-06-07 ENCOUNTER — Telehealth: Payer: Self-pay | Admitting: Internal Medicine

## 2011-06-07 DIAGNOSIS — I959 Hypotension, unspecified: Secondary | ICD-10-CM

## 2011-06-07 DIAGNOSIS — I5023 Acute on chronic systolic (congestive) heart failure: Secondary | ICD-10-CM

## 2011-06-07 NOTE — Telephone Encounter (Signed)
Pt needs to know if she can continue PT, can she exercise Friday? pls call [phone]

## 2011-06-07 NOTE — Progress Notes (Signed)
HPI Ashley [name] is referred today for consideration for ICD implantation. She is a pleasant 66 yo woman with a DCM and class 2 CHF. Her EF is 25%.  The patient also has COPD and bronchiectasis on CT scan. She recently developed syncope. The spell sounds more like orthostasis as it occurred after standing, was not associated with palpitations and resolved after lying down. She has had additional near syncopal episodes. She denies peripheral edema.  Allergies  Allergen Reactions  . Antihistamines, Chlorpheniramine-Type Other (See Comments)    Makes her eyes look like she sees strobe lights  . Atorvastatin     REACTION: muscle cramps. Pt states she's taken this now  . Influenza Vac Split (Flu Virus Vaccine) Other (See Comments)    Body aches  . Latex   . Penicillins     REACTION: immune to it, able to take amoxicillin  . Promethazine Hcl     REACTION: Dizzy, headache  . Rosuvastatin     REACTION: not effective  . Sulfasalazine      Current Outpatient Prescriptions  Medication Sig Dispense Refill  . ALPRAZolam (XANAX) 0.25 MG tablet Take 1 tablet (0.25 mg total) by mouth 2 (two) times daily. Takes half a pill prn  30 tablet  0  . aspirin 81 MG EC tablet Take 81 mg by mouth at bedtime.       Marland Kitchen atorvastatin (LIPITOR) 10 MG tablet Take 1 tablet (10 mg total) by mouth daily. Brand medically necessary.  30 tablet  11  . bisoprolol (ZEBETA) 5 MG tablet Take 1 tablet (5 mg total) by mouth daily.  30 tablet  3  . calcium carbonate (TUMS - DOSED IN MG ELEMENTAL CALCIUM) 500 MG chewable tablet Chew 1 tablet by mouth daily. heartburn       . Cholecalciferol (D3-1000) 1000 UNITS capsule Take 1,000 Units by mouth daily.      Marland Kitchen dextromethorphan (DELSYM) 30 MG/5ML liquid 2 tsp every 12 hours as needed       . furosemide (LASIX) 20 MG tablet Take 30 mg by mouth daily. Please check your weight daily, and get you BMP checked by Primary MD 1/week for 1 month.      . hydrALAZINE (APRESOLINE) 25 MG tablet  Take 12.5 mg by mouth 3 (three) times daily.        Marland Kitchen HYDROcodone-acetaminophen (VICODIN ES) 7.5-750 MG per tablet Take 1 tablet by mouth every 6 (six) hours as needed. pain      . isosorbide dinitrate (ISORDIL) 30 MG tablet Take 1/2 tablet twice daily      . metolazone (ZAROXOLYN) 2.5 MG tablet Take 1 tablet (2.5 mg total) by mouth daily as needed.  30 tablet  6  . mometasone (NASONEX) 50 MCG/ACT nasal spray Place 1 spray into the nose 2 (two) times daily.       . naproxen sodium (ANAPROX) 220 MG tablet Per bottle      . omeprazole (PRILOSEC OTC) 20 MG tablet Take 40 mg by mouth daily.      . potassium chloride (K-DUR) 10 MEQ tablet Take 1 tablet (10 mEq total) by mouth 2 (two) times daily. When on higher lasix dose or metolazone  60 tablet  3  . vitamin C (ASCORBIC ACID) 500 MG tablet Take 500 mg by mouth daily.        Marland Kitchen zolpidem (AMBIEN) 5 MG tablet 1/2 to 1 tablet at bedtime as needed For insomnia  Past Medical History  Diagnosis Date  . Carpal tunnel syndrome   . Migraine without aura, without mention of intractable migraine without mention of status migrainosus   . Diarrhea   . Diverticulosis of colon     no diverticulitis  . Fibromyalgia   . Pure hypercholesterolemia   . Irritable bowel syndrome   . Laryngeal nodule     Dr. Thelma Comp  . Fibrocystic breast disease   . Osteoarthritis   . Hemorrhoids   . Anxiety   . GERD (gastroesophageal reflux disease)   . Systolic CHF     EF 25-35%, LV dilation, significant MR  . Nonischemic cardiomyopathy   . Pneumonia   . Mitral regurgitation   . Anxiety   . Pleural effusion, bilateral 2012  . Bronchiectasis     mild, treat URIs aggressively    ROS:   All systems reviewed and negative except as noted in the HPI.   Past Surgical History  Procedure Date  . Appendectomy   . Mandible surgery   . Knee arthroscopy w/ orif     Left  . Osteotomy     Left foot  . 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 Total abdominal  hysterectomy w/ bilateral salpingoophorectomy 1987    Endometriosis  . Dexa 03/28/99    osteopenia L/S  . Colonoscopy 1998    nml (sigmoid-Gilbert-nol)  . Head mri, head ct 10/1998    ? H/A focus ([facility])  . Emg/mcv 10/16/01    + mild carpal tunnel  . Dexa 03/12/03    2.2 Spine 0.1 Hip  Osteopenia  . Mri right hip 12/12/01    Tendonitis Gluteus Medius to Gtr Trochanter Neg Bursitis (Dr. Montez Morita)  . L/s films 11/21/01    nml; Right hip nml  . Mri u/s 11/21/01    min. DDD L/3-4  . Colonoscopy 01/04/04    polyps, diverticulosis/severe  . Dexa 11/14/05    1.9 Spine 0.3 Hip  slight improvement  . Flex laryngoscopy 06/11/06    ([facility]) nml  . Dexa 11/22/06    improved osteo and fem neck  . [facility] bunionectomy 11/04/08    Right Great Toe (Dr. Lestine Box)  . Breast mass excision 01/2010    Left-fibrocystic change w/intraductal papilloma, no malignancy  . Ct chest 12/2010    possible bronchiectasis lower lobes, 02/2011 - enlarged bilateral effusions, bibasilar atx  . Stress myoview 02/2011    no significant ischemia  . Ct chest 04/2011    no PE.  abnormal R hilar and mediastinal adenopathy, rpt 3-6 mo  . US echocardiography 04/2011    mildly dilated LV, EF 25-30%, hypokinesis, restrictive physiology, mod MR, no AS     Family History  Problem Relation Age of Onset  . Lung cancer Father     + smoker  . Dementia Mother   . Osteoporosis Mother     Lumbar spine  . Stroke Mother     x 5 @ 3 YOA  . Arthritis Mother     hands  . Emphysema Mother   . Alcohol abuse Paternal Grandfather   . Hypertension Maternal Grandfather     ?  . Stroke Paternal Grandfather     ?  Marland Kitchen Heart disease      grandmother  . Heart disease      grandfather     History   Social History  . Marital Status: Married    Spouse Name: N/A    Number of Children:  1  . Years of Education: N/A   Occupational History  . 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 Makes [employer]    Social History Main  Topics  . Smoking status: Never Smoker   . Smokeless tobacco: Never Used   Comment: Lived with smokers x 23 yrs, smoked herself "for a week"  . Alcohol Use: No  . Drug Use: No  . Sexually Active: No   Other Topics Concern  . Not on file   Social History Narrative   1 adopted childLives with husband     BP 99/61  Pulse 76  Ht 5' 5.5" (1.664 m)  Wt 69.854 kg (154 lb)  BMI 25.24 kg/m2  Physical Exam:  Well appearing NAD HEENT: Unremarkable Neck:  No JVD, no thyromegally Lungs:  Clear with no wheezes, rales, or rhonchi.  HEART:  Regular rate rhythm, no murmurs, no rubs, no clicks Abd:  soft, positive bowel sounds, no organomegally, no rebound, no guarding Ext:  2 plus pulses, no edema, no cyanosis, no clubbing Skin:  No rashes no nodules Neuro:  CN II through XII intact, motor grossly intact  EKG NSR with normal intervals.  Assess/Plan:

## 2011-06-07 NOTE — Telephone Encounter (Signed)
The patient can only come this Friday next Tues or Thurs. She has a CAT scan on Monday, physical therapy on Monday and Friday.

## 2011-06-07 NOTE — Telephone Encounter (Signed)
The patient's blood pressure was not running low and she was already standing when she fainted.   She will make an appointment with EP to consider if ICD placement for nonischemic cardiomyopathy is the option per Dr. Mariah Milling request.

## 2011-06-07 NOTE — Assessment & Plan Note (Addendum)
The patient has class 2 symptoms despite maximal medical therapy. Her EF is 25%. She has been intolerant to ACE inhibitors or ARB's. I have discussed the risks/benefits/goals/expectations of ICD implant. She wishes to proceed. I will discuss the timing of this procedure and with Dr. Lewie Loron.

## 2011-06-07 NOTE — Assessment & Plan Note (Signed)
This appears to have resolved after discontinuation of her ARB/ACE inhibitors.

## 2011-06-07 NOTE — Telephone Encounter (Signed)
Routing.- closed in error

## 2011-06-07 NOTE — Telephone Encounter (Signed)
I spoke with the patient. I explained to her that Dr. Ladona Ridgel is going to add himself into the office today starting at 2:00pm. She would be available to come today. I explained I will forward this message to the scheduler for Dr. Ladona Ridgel and she will be in touch with her about a time. The patient will be in a class from 10am-12pm. She states that we can call her cell and just leave a message of whatever time she needs to be here.

## 2011-06-08 DIAGNOSIS — M5030 Other cervical disc degeneration, unspecified cervical region: Secondary | ICD-10-CM | POA: Diagnosis not present

## 2011-06-08 DIAGNOSIS — M999 Biomechanical lesion, unspecified: Secondary | ICD-10-CM | POA: Diagnosis not present

## 2011-06-08 DIAGNOSIS — M5137 Other intervertebral disc degeneration, lumbosacral region: Secondary | ICD-10-CM | POA: Diagnosis not present

## 2011-06-08 DIAGNOSIS — M9981 Other biomechanical lesions of cervical region: Secondary | ICD-10-CM | POA: Diagnosis not present

## 2011-06-08 NOTE — Telephone Encounter (Signed)
Zebeta must be coming from her Cardiologist as I did not prescribe it.

## 2011-06-08 NOTE — Telephone Encounter (Signed)
Per Dr Ladona Ridgel okay to continue PT

## 2011-06-08 NOTE — Telephone Encounter (Signed)
Spoke with pt and let her know Dr Ladona Ridgel says it is okay for her to do PT

## 2011-06-09 NOTE — Telephone Encounter (Signed)
Dr Ladona Ridgel spoke with Dr Lewie Loron and they have decided it is best to wait until March to redo Echo and see what her EF is then  I have explained this to her extensively.  She is still somewhat concerned and does not understand why she needs to wait.  I let her know that both Dr Lewie Loron and Dr Ladona Ridgel were in agreement with how to proceed

## 2011-06-10 ENCOUNTER — Other Ambulatory Visit: Payer: Self-pay | Admitting: *Deleted

## 2011-06-12 ENCOUNTER — Ambulatory Visit (INDEPENDENT_AMBULATORY_CARE_PROVIDER_SITE_OTHER)
Admission: RE | Admit: 2011-06-12 | Discharge: 2011-06-12 | Disposition: A | Discharge status: Home / Self Care | Payer: Medicare Other | Source: Ambulatory Visit | Attending: Pulmonary Disease | Admitting: Pulmonary Disease

## 2011-06-12 ENCOUNTER — Telehealth: Payer: Self-pay | Admitting: Pulmonary Disease

## 2011-06-12 DIAGNOSIS — J9819 Other pulmonary collapse: Secondary | ICD-10-CM | POA: Diagnosis not present

## 2011-06-12 DIAGNOSIS — R590 Localized enlarged lymph nodes: Secondary | ICD-10-CM

## 2011-06-12 DIAGNOSIS — R599 Enlarged lymph nodes, unspecified: Secondary | ICD-10-CM

## 2011-06-12 DIAGNOSIS — I509 Heart failure, unspecified: Secondary | ICD-10-CM | POA: Diagnosis not present

## 2011-06-12 MED ORDER — IOHEXOL 300 MG/ML  SOLN
80.0000 mL | Freq: Once | INTRAMUSCULAR | Status: AC | PRN
  Administered 2011-06-12: 80 mL via INTRAVENOUS

## 2011-06-12 NOTE — Telephone Encounter (Signed)
I called Ashley [name] to let her know that her CT thorax showed resolution of the mediastinal lymphadenopathy and other changes seen in the December study.  MCQUAID, DOUGLAS

## 2011-06-13 ENCOUNTER — Other Ambulatory Visit: Payer: Medicare Other

## 2011-06-14 ENCOUNTER — Ambulatory Visit: Payer: Medicare Other | Admitting: Cardiovascular Disease

## 2011-06-15 DIAGNOSIS — M5137 Other intervertebral disc degeneration, lumbosacral region: Secondary | ICD-10-CM | POA: Diagnosis not present

## 2011-06-15 DIAGNOSIS — M999 Biomechanical lesion, unspecified: Secondary | ICD-10-CM | POA: Diagnosis not present

## 2011-06-15 DIAGNOSIS — M5030 Other cervical disc degeneration, unspecified cervical region: Secondary | ICD-10-CM | POA: Diagnosis not present

## 2011-06-15 DIAGNOSIS — M9981 Other biomechanical lesions of cervical region: Secondary | ICD-10-CM | POA: Diagnosis not present

## 2011-06-16 ENCOUNTER — Encounter: Payer: Self-pay | Admitting: Pulmonary Disease

## 2011-06-16 ENCOUNTER — Telehealth: Payer: Self-pay | Admitting: Family Medicine

## 2011-06-16 DIAGNOSIS — I509 Heart failure, unspecified: Secondary | ICD-10-CM | POA: Diagnosis not present

## 2011-06-16 DIAGNOSIS — Z5189 Encounter for other specified aftercare: Secondary | ICD-10-CM | POA: Diagnosis not present

## 2011-06-16 NOTE — Telephone Encounter (Signed)
Dr Reece Agar, [facility] just had you sign an order for an overnite oximetry to qualify Ms Suh for her 33 that was never qualified by the hospital Dr when they put her on 02. She wants a portable 02 unit to take on a cruise and in order to be able to do that they have to get her to qualify. Once she does they will ask you to write her an order for the portable unit. WE should have results by Monday and I will hear from [facility] what they need from you! Thanks, Shirlee Limerick

## 2011-06-16 NOTE — Telephone Encounter (Signed)
thx

## 2011-06-19 ENCOUNTER — Other Ambulatory Visit: Payer: Self-pay | Admitting: *Deleted

## 2011-06-19 DIAGNOSIS — M999 Biomechanical lesion, unspecified: Secondary | ICD-10-CM | POA: Diagnosis not present

## 2011-06-19 DIAGNOSIS — M5137 Other intervertebral disc degeneration, lumbosacral region: Secondary | ICD-10-CM | POA: Diagnosis not present

## 2011-06-19 DIAGNOSIS — M5030 Other cervical disc degeneration, unspecified cervical region: Secondary | ICD-10-CM | POA: Diagnosis not present

## 2011-06-19 DIAGNOSIS — M9981 Other biomechanical lesions of cervical region: Secondary | ICD-10-CM | POA: Diagnosis not present

## 2011-06-19 MED ORDER — ALPRAZOLAM 0.25 MG PO TABS: 0.2500 mg | ORAL_TABLET | Freq: Two times a day (BID) | ORAL | Status: DC

## 2011-06-19 NOTE — Telephone Encounter (Signed)
OK to refill

## 2011-06-19 NOTE — Telephone Encounter (Signed)
plz phone in. 

## 2011-06-19 NOTE — Telephone Encounter (Signed)
Rx called in as directed.   

## 2011-06-22 DIAGNOSIS — M5137 Other intervertebral disc degeneration, lumbosacral region: Secondary | ICD-10-CM | POA: Diagnosis not present

## 2011-06-22 DIAGNOSIS — M5030 Other cervical disc degeneration, unspecified cervical region: Secondary | ICD-10-CM | POA: Diagnosis not present

## 2011-06-22 DIAGNOSIS — M9981 Other biomechanical lesions of cervical region: Secondary | ICD-10-CM | POA: Diagnosis not present

## 2011-06-22 DIAGNOSIS — M999 Biomechanical lesion, unspecified: Secondary | ICD-10-CM | POA: Diagnosis not present

## 2011-06-23 ENCOUNTER — Encounter: Payer: Self-pay | Admitting: Cardiovascular Disease

## 2011-06-23 ENCOUNTER — Ambulatory Visit (INDEPENDENT_AMBULATORY_CARE_PROVIDER_SITE_OTHER): Payer: Medicare Other | Admitting: Cardiovascular Disease

## 2011-06-23 DIAGNOSIS — R0602 Shortness of breath: Secondary | ICD-10-CM | POA: Diagnosis not present

## 2011-06-23 DIAGNOSIS — I059 Rheumatic mitral valve disease, unspecified: Secondary | ICD-10-CM

## 2011-06-23 DIAGNOSIS — I428 Other cardiomyopathies: Secondary | ICD-10-CM

## 2011-06-23 DIAGNOSIS — I5023 Acute on chronic systolic (congestive) heart failure: Secondary | ICD-10-CM

## 2011-06-23 DIAGNOSIS — R079 Chest pain, unspecified: Secondary | ICD-10-CM

## 2011-06-23 DIAGNOSIS — I340 Nonrheumatic mitral (valve) insufficiency: Secondary | ICD-10-CM

## 2011-06-23 MED ORDER — TORSEMIDE 20 MG PO TABS: 20.0000 mg | ORAL_TABLET | Freq: Two times a day (BID) | ORAL | Status: DC

## 2011-06-23 NOTE — Assessment & Plan Note (Signed)
She has a suspected nonischemic are mildly, ejection fraction less than 30%.  We need to define whether she needs a defibrillator in the next month or so. She has discussed this with Dr. Ladona Ridgel. We will order a cardiac MRI to evaluate her ejection fraction, determine if there is any significant scar, or other pathology.

## 2011-06-23 NOTE — Assessment & Plan Note (Signed)
Her moderate mitral valve regurgitation is likely reflective of her underlying systolic heart failure. We'll continue aggressive diuresis.

## 2011-06-23 NOTE — Assessment & Plan Note (Signed)
Shortness of breath and cough is likely secondary to worsening heart failure. We have changed her to torsemide and encouraged her not to eat out as much and minimize her fluid intake.

## 2011-06-23 NOTE — Assessment & Plan Note (Signed)
Weight is up on today's visit. We will change her Lasix to twice in my 20 mg b.i.d. Until her weight is less than 150 pounds. We have encouraged her to take potassium 20 mEq a day up from 10 mEq a day. She can also take metolazone 2.5 mg 30 minutes prior to the torsemide in the morning if she does not have adequate diuresis on torsemide alone.

## 2011-06-23 NOTE — Patient Instructions (Signed)
Please stop the lasix Start torsemide 20 mg twice a day If your weight does not come down, please add the metolazone 30 minutes before the torsemide  Please call us if you have new issues that need to be addressed before your next appt.  Your physician wants you to follow-up in: 1 months.  You will receive a reminder letter in the mail two months in advance. If you don't receive a letter, please call our office to schedule the follow-up appointment.

## 2011-06-23 NOTE — Progress Notes (Signed)
Patient ID: Ashley [name], female    DOB: 1945/09/29, 66 y.o.   MRN: [id number]  HPI Comments: 66 y/o female with history of pneumonia, chronic cough, diagnosis of COPD made recently during a hospital course at [facility], recent shortness of breath since September 2012 that presented acutely on September 1, CT scan showing progressive pleural effusion on a repeat scan , echo showing Cardiomyopathy  ejection fraction estimated at 25-35%,  Previous  [employer]  showed no significant ischemia, no EKG changes concerning for ischemia.   Follow up admission to [facility] in December for bronchitis and hypoxia as well as systolic CHF. She had antibiotics, prednisone and diuresis and has felt better since discharge.  She reports that she continues to need oxygen, her cough is Worse over the past week or so. Her goal weight on her last clinic visit was in the low 140 range ( on her scale at home) and weight of 145-148 pounds in clinic. She is currently 159 pounds. Her husband reports that they have been eating out a lot, she has been drinking more than usual. She has not been able to tolerate large doses of diuretics as it causes her mouth to get dry. In addition to a cough, she has felt more short of breath.  Echocardiogram on May 02 2011 showed moderate pulmonary hypertension, ejection fraction 25%.   EKG shows normal sinus rhythm with rate 74 beats per minute with no significant ST or T wave changes      Outpatient Encounter Prescriptions as of 06/23/2011  Medication Sig Dispense Refill  . ALPRAZolam (XANAX) 0.25 MG tablet Take 1 tablet (0.25 mg total) by mouth 2 (two) times daily. Takes half a pill prn  30 tablet  0  . aspirin 81 MG EC tablet Take 81 mg by mouth at bedtime.       Marland Kitchen atorvastatin (LIPITOR) 10 MG tablet Take 1 tablet (10 mg total) by mouth daily. Brand medically necessary.  30 tablet  11  . bisoprolol (ZEBETA) 5 MG tablet Take 1 tablet (5 mg total) by mouth daily.   30 tablet  3  . calcium carbonate (TUMS - DOSED IN MG ELEMENTAL CALCIUM) 500 MG chewable tablet Chew 1 tablet by mouth daily. heartburn       . Cholecalciferol (D3-1000) 1000 UNITS capsule Take 1,000 Units by mouth daily.      Marland Kitchen dextromethorphan (DELSYM) 30 MG/5ML liquid 2 tsp every 12 hours as needed       . hydrALAZINE (APRESOLINE) 25 MG tablet Take 12.5 mg by mouth 3 (three) times daily.        Marland Kitchen HYDROcodone-acetaminophen (VICODIN ES) 7.5-750 MG per tablet Take 1 tablet by mouth every 6 (six) hours as needed. pain      . metolazone (ZAROXOLYN) 2.5 MG tablet Take 1 tablet (2.5 mg total) by mouth daily as needed.  30 tablet  6  . mometasone (NASONEX) 50 MCG/ACT nasal spray Place 1 spray into the nose 2 (two) times daily.       . naproxen sodium (ANAPROX) 220 MG tablet Per bottle      . nystatin (MYCOSTATIN) 100000 UNIT/ML suspension       . omeprazole (PRILOSEC OTC) 20 MG tablet Take 40 mg by mouth daily.      Marland Kitchen omeprazole (PRILOSEC) 40 MG capsule       . potassium chloride (K-DUR) 10 MEQ tablet Take 1 tablet (10 mEq total) by mouth 2 (two) times daily. When on  higher lasix dose or metolazone  60 tablet  3  . vitamin C (ASCORBIC ACID) 500 MG tablet Take 500 mg by mouth daily.        Marland Kitchen zolpidem (AMBIEN) 5 MG tablet 1/2 to 1 tablet at bedtime as needed For insomnia        .  furosemide (LASIX) 20 MG tablet Take 30 mg by mouth daily. Please check your weight daily, and get you BMP checked by Primary MD 1/week for 1 month.      . isosorbide dinitrate (ISORDIL) 30 MG tablet Take 1/2 tablet twice daily          Review of Systems  Constitutional: Positive for fatigue and unexpected weight change.       On 2 L nasal cannula oxygen  HENT: Negative.   Eyes: Negative.   Respiratory: Positive for cough and shortness of breath.   Cardiovascular: Negative.   Gastrointestinal: Negative.   Musculoskeletal: Negative.   Skin: Negative.   Neurological: Negative.   Hematological: Negative.     Psychiatric/Behavioral: Negative.   All other systems reviewed and are negative.     BP 124/74  Pulse 74  Ht 5\' 5"  (1.651 m)  Wt 156 lb 6.4 oz (70.943 kg)  BMI 26.03 kg/m2  Physical Exam  Nursing note and vitals reviewed. Constitutional: She is oriented to person, place, and time. She appears well-developed and well-nourished.  HENT:  Head: Normocephalic.  Nose: Nose normal.  Mouth/Throat: Oropharynx is clear and moist.  Eyes: Conjunctivae are normal. Pupils are equal, round, and reactive to light.  Neck: Normal range of motion. Neck supple. No JVD present.  Cardiovascular: Normal rate, regular rhythm, S1 normal, S2 normal, normal heart sounds and intact distal pulses.  Exam reveals no gallop and no friction rub.   No murmur heard. Pulmonary/Chest: Effort normal and breath sounds normal. No respiratory distress. She has no wheezes. She has no rales. She exhibits no tenderness.  Abdominal: Soft. Bowel sounds are normal. She exhibits no distension. There is no tenderness.  Musculoskeletal: Normal range of motion. She exhibits no edema and no tenderness.  Lymphadenopathy:    She has no cervical adenopathy.  Neurological: She is alert and oriented to person, place, and time. Coordination normal.  Skin: Skin is warm and dry. No rash noted. No erythema.  Psychiatric: She has a normal mood and affect. Her behavior is normal. Judgment and thought content normal.         Assessment and Plan

## 2011-06-28 ENCOUNTER — Telehealth: Payer: Self-pay | Admitting: Cardiovascular Disease

## 2011-06-28 ENCOUNTER — Encounter: Payer: Self-pay | Admitting: Family Medicine

## 2011-06-28 DIAGNOSIS — M5137 Other intervertebral disc degeneration, lumbosacral region: Secondary | ICD-10-CM | POA: Diagnosis not present

## 2011-06-28 DIAGNOSIS — M999 Biomechanical lesion, unspecified: Secondary | ICD-10-CM | POA: Diagnosis not present

## 2011-06-28 DIAGNOSIS — M5030 Other cervical disc degeneration, unspecified cervical region: Secondary | ICD-10-CM | POA: Diagnosis not present

## 2011-06-28 DIAGNOSIS — M9981 Other biomechanical lesions of cervical region: Secondary | ICD-10-CM | POA: Diagnosis not present

## 2011-06-28 NOTE — Telephone Encounter (Signed)
Pt wants to know what antibiotic that she can take with all her other meds. Pt thinks she has a wax ball that fell into her ear and she was exposed to pink eye and her eye is watering. Also her nose is still bleeding.

## 2011-06-28 NOTE — Telephone Encounter (Signed)
I talked with pt. Pt states that she has an ear ache and pink eye. She feels she may need an antibiotic. I recommended pt contact her PCP for recommendations. Pt agreed.

## 2011-06-29 DIAGNOSIS — R040 Epistaxis: Secondary | ICD-10-CM | POA: Diagnosis not present

## 2011-06-29 DIAGNOSIS — I509 Heart failure, unspecified: Secondary | ICD-10-CM | POA: Diagnosis not present

## 2011-06-29 DIAGNOSIS — R0902 Hypoxemia: Secondary | ICD-10-CM | POA: Diagnosis not present

## 2011-06-29 DIAGNOSIS — H6120 Impacted cerumen, unspecified ear: Secondary | ICD-10-CM | POA: Diagnosis not present

## 2011-07-03 ENCOUNTER — Ambulatory Visit (HOSPITAL_COMMUNITY)
Admission: RE | Admit: 2011-07-03 | Discharge: 2011-07-03 | Disposition: A | Discharge status: Home / Self Care | Payer: Medicare Other | Source: Ambulatory Visit | Attending: Cardiovascular Disease | Admitting: Cardiovascular Disease

## 2011-07-03 ENCOUNTER — Other Ambulatory Visit: Payer: Self-pay | Admitting: *Deleted

## 2011-07-03 ENCOUNTER — Encounter: Payer: Self-pay | Admitting: *Deleted

## 2011-07-03 DIAGNOSIS — I428 Other cardiomyopathies: Secondary | ICD-10-CM | POA: Insufficient documentation

## 2011-07-03 DIAGNOSIS — I340 Nonrheumatic mitral (valve) insufficiency: Secondary | ICD-10-CM

## 2011-07-03 DIAGNOSIS — I5023 Acute on chronic systolic (congestive) heart failure: Secondary | ICD-10-CM

## 2011-07-03 DIAGNOSIS — M999 Biomechanical lesion, unspecified: Secondary | ICD-10-CM | POA: Diagnosis not present

## 2011-07-03 DIAGNOSIS — M5137 Other intervertebral disc degeneration, lumbosacral region: Secondary | ICD-10-CM | POA: Diagnosis not present

## 2011-07-03 DIAGNOSIS — R0602 Shortness of breath: Secondary | ICD-10-CM

## 2011-07-03 DIAGNOSIS — M5030 Other cervical disc degeneration, unspecified cervical region: Secondary | ICD-10-CM | POA: Diagnosis not present

## 2011-07-03 DIAGNOSIS — M9981 Other biomechanical lesions of cervical region: Secondary | ICD-10-CM | POA: Diagnosis not present

## 2011-07-03 MED ORDER — ALPRAZOLAM 0.25 MG PO TABS: 0.2500 mg | ORAL_TABLET | Freq: Two times a day (BID) | ORAL | Status: DC | PRN

## 2011-07-03 MED ORDER — GADOBENATE DIMEGLUMINE 529 MG/ML IV SOLN
23.0000 mL | Freq: Once | INTRAVENOUS | Status: AC
  Administered 2011-07-03: 23 mL via INTRAVENOUS

## 2011-07-03 NOTE — Telephone Encounter (Signed)
Received refill request from pharmacy. Directions on the refill request reads; take 1/2 to 1 tablet twice a day as needed. Please confirm.

## 2011-07-03 NOTE — Telephone Encounter (Signed)
Please phone in

## 2011-07-04 NOTE — Telephone Encounter (Signed)
Rx called in as directed.   

## 2011-07-05 DIAGNOSIS — I428 Other cardiomyopathies: Secondary | ICD-10-CM | POA: Diagnosis not present

## 2011-07-06 ENCOUNTER — Telehealth: Payer: Self-pay | Admitting: Cardiovascular Disease

## 2011-07-06 DIAGNOSIS — M5137 Other intervertebral disc degeneration, lumbosacral region: Secondary | ICD-10-CM | POA: Diagnosis not present

## 2011-07-06 DIAGNOSIS — M999 Biomechanical lesion, unspecified: Secondary | ICD-10-CM | POA: Diagnosis not present

## 2011-07-06 DIAGNOSIS — M9981 Other biomechanical lesions of cervical region: Secondary | ICD-10-CM | POA: Diagnosis not present

## 2011-07-06 DIAGNOSIS — M5030 Other cervical disc degeneration, unspecified cervical region: Secondary | ICD-10-CM | POA: Diagnosis not present

## 2011-07-06 NOTE — Telephone Encounter (Signed)
Spoke with pt, she is waiting for the results of her cardiac MRI and if she needs ICD. Will discuss with dr taylor when he gets to the office this afternoon and call pt back. Pt agrees with this plan.

## 2011-07-06 NOTE — Telephone Encounter (Signed)
Discussed pt issues with dr Ladona Ridgel, he is not sure exactly what the plan is and will forward this to dr Mariah Milling for his input. Pt aware

## 2011-07-06 NOTE — Telephone Encounter (Signed)
FU Call: Pt returning call from our office. Pt doesn't know what the call was regarding. Please return pt call to discuss further.

## 2011-07-07 NOTE — Telephone Encounter (Signed)
EF of 34%  with nonischemic CHF Question is whether she needs ICD. Better compared to pervious echos. Maybe we wait? What do you think... tim

## 2011-07-11 ENCOUNTER — Telehealth: Payer: Self-pay | Admitting: *Deleted

## 2011-07-11 NOTE — Telephone Encounter (Signed)
Message copied by Antony Odea on Tue Jul 11, 2011  1:55 PM ------      Message from: Phoebe Sharps      Created: Sun Jul 09, 2011 12:59 PM       I am waiting to hear back from Dr. Ladona Ridgel on what to do about EF of 34%      Uncertain if we wait longer before ICD      She may want to make an appt with Dr. Ladona Ridgel to discuss.      One option may be to wait and evaluate at a later date.      Some risk of arrhythmia.

## 2011-07-11 NOTE — Telephone Encounter (Signed)
See results of cardiac MR, she has been seen by Dr Ladona Ridgel once and is now wanting to proceed with ICD, please call pt back to set up and to discuss further. Informed her she will hear back later today or tomorrow.

## 2011-07-12 ENCOUNTER — Telehealth: Payer: Self-pay | Admitting: Cardiovascular Disease

## 2011-07-12 ENCOUNTER — Encounter (HOSPITAL_COMMUNITY): Payer: Self-pay | Admitting: Pharmacy Technician

## 2011-07-12 NOTE — Telephone Encounter (Signed)
EF has remained low. Would go ahead. There is information he can get off the ICD to help with fluid management to help he feel better

## 2011-07-12 NOTE — Telephone Encounter (Signed)
Spoke with patient this morning.  She wants to talk with Dr Lewie Loron before she proceeds.  She is worried because she is going to [place] on 08/28/11 and wants to make sure everythisn will be good for her to go on trip

## 2011-07-12 NOTE — Telephone Encounter (Signed)
Pt called wanting your blessing to have an ICD placed with Dr Ladona Ridgel on Friday.

## 2011-07-12 NOTE — Telephone Encounter (Signed)
Ashley [name] wanted Dr Ladona Ridgel to know that she wants to have the pacemaker/defib placed on Friday, 07/14/11

## 2011-07-12 NOTE — Telephone Encounter (Signed)
Spoke with patient and she is going to get ICD on Friday

## 2011-07-13 ENCOUNTER — Other Ambulatory Visit: Payer: Self-pay | Admitting: *Deleted

## 2011-07-13 ENCOUNTER — Other Ambulatory Visit: Payer: Self-pay | Admitting: Family Medicine

## 2011-07-13 DIAGNOSIS — I519 Heart disease, unspecified: Secondary | ICD-10-CM

## 2011-07-13 DIAGNOSIS — E0590 Thyrotoxicosis, unspecified without thyrotoxic crisis or storm: Secondary | ICD-10-CM

## 2011-07-13 DIAGNOSIS — R739 Hyperglycemia, unspecified: Secondary | ICD-10-CM

## 2011-07-13 DIAGNOSIS — R7989 Other specified abnormal findings of blood chemistry: Secondary | ICD-10-CM

## 2011-07-13 DIAGNOSIS — I5020 Unspecified systolic (congestive) heart failure: Secondary | ICD-10-CM

## 2011-07-13 MED ORDER — SODIUM CHLORIDE 0.9 % IR SOLN
80.0000 mg | Status: DC
  Filled 2011-07-13: qty 2

## 2011-07-13 MED ORDER — SODIUM CHLORIDE 0.45 % IV SOLN
INTRAVENOUS | Status: DC
  Administered 2011-07-14: 13:00:00 via INTRAVENOUS

## 2011-07-13 MED ORDER — SODIUM CHLORIDE 0.9 % IV SOLN
1500.0000 mg | INTRAVENOUS | Status: DC
  Filled 2011-07-13 (×2): qty 1500

## 2011-07-13 MED ORDER — SODIUM CHLORIDE 0.9 % IV SOLN
INTRAVENOUS | Status: DC
  Administered 2011-07-14: 13:00:00 via INTRAVENOUS

## 2011-07-13 MED ORDER — CHLORHEXIDINE GLUCONATE 4 % EX LIQD
60.0000 mL | Freq: Once | CUTANEOUS | Status: DC
  Filled 2011-07-13: qty 60

## 2011-07-14 ENCOUNTER — Ambulatory Visit (HOSPITAL_COMMUNITY)
Admission: RE | Admit: 2011-07-14 | Discharge: 2011-07-15 | Disposition: A | Discharge status: Home / Self Care | Payer: Medicare Other | Source: Ambulatory Visit | Attending: Internal Medicine | Admitting: Internal Medicine

## 2011-07-14 ENCOUNTER — Encounter: Payer: Self-pay | Admitting: Pulmonary Disease

## 2011-07-14 ENCOUNTER — Encounter (HOSPITAL_COMMUNITY): Payer: Self-pay | Admitting: General Practice

## 2011-07-14 ENCOUNTER — Encounter (HOSPITAL_COMMUNITY)
Admission: RE | Disposition: A | Discharge status: Home / Self Care | Payer: Self-pay | Source: Ambulatory Visit | Attending: Internal Medicine

## 2011-07-14 DIAGNOSIS — R0602 Shortness of breath: Secondary | ICD-10-CM

## 2011-07-14 DIAGNOSIS — M1990 Unspecified osteoarthritis, unspecified site: Secondary | ICD-10-CM | POA: Insufficient documentation

## 2011-07-14 DIAGNOSIS — K219 Gastro-esophageal reflux disease without esophagitis: Secondary | ICD-10-CM | POA: Diagnosis not present

## 2011-07-14 DIAGNOSIS — I059 Rheumatic mitral valve disease, unspecified: Secondary | ICD-10-CM | POA: Insufficient documentation

## 2011-07-14 DIAGNOSIS — I5022 Chronic systolic (congestive) heart failure: Secondary | ICD-10-CM | POA: Diagnosis not present

## 2011-07-14 DIAGNOSIS — E876 Hypokalemia: Secondary | ICD-10-CM

## 2011-07-14 DIAGNOSIS — F411 Generalized anxiety disorder: Secondary | ICD-10-CM | POA: Insufficient documentation

## 2011-07-14 DIAGNOSIS — I519 Heart disease, unspecified: Secondary | ICD-10-CM

## 2011-07-14 DIAGNOSIS — Z5189 Encounter for other specified aftercare: Secondary | ICD-10-CM | POA: Diagnosis not present

## 2011-07-14 DIAGNOSIS — J479 Bronchiectasis, uncomplicated: Secondary | ICD-10-CM | POA: Insufficient documentation

## 2011-07-14 DIAGNOSIS — I428 Other cardiomyopathies: Secondary | ICD-10-CM | POA: Insufficient documentation

## 2011-07-14 DIAGNOSIS — I509 Heart failure, unspecified: Secondary | ICD-10-CM | POA: Diagnosis not present

## 2011-07-14 DIAGNOSIS — R0609 Other forms of dyspnea: Secondary | ICD-10-CM | POA: Diagnosis not present

## 2011-07-14 HISTORY — DX: Heart failure, unspecified: I50.9

## 2011-07-14 HISTORY — DX: Sleep apnea, unspecified: G47.30

## 2011-07-14 HISTORY — DX: Depression, unspecified: F32.A

## 2011-07-14 HISTORY — DX: Other disorders of lung: J98.4

## 2011-07-14 HISTORY — PX: IMPLANTABLE CARDIOVERTER DEFIBRILLATOR IMPLANT: SHX5473

## 2011-07-14 HISTORY — DX: Major depressive disorder, single episode, unspecified: F32.9

## 2011-07-14 HISTORY — DX: Anemia, unspecified: D64.9

## 2011-07-14 LAB — BASIC METABOLIC PANEL
BUN: 42 mg/dL — ABNORMAL HIGH (ref 6–23)
CO2: 32 mEq/L (ref 19–32)
Calcium: 11.7 mg/dL — ABNORMAL HIGH (ref 8.4–10.5)
Chloride: 92 mEq/L — ABNORMAL LOW (ref 96–112)
Creatinine, Ser: 1.22 mg/dL — ABNORMAL HIGH (ref 0.50–1.10)
GFR calc Af Amer: 53 mL/min — ABNORMAL LOW (ref >90)
GFR calc non Af Amer: 45 mL/min — ABNORMAL LOW (ref >90)
Glucose, Bld: 96 mg/dL (ref 70–99)
Potassium: 2.8 mEq/L — ABNORMAL LOW (ref 3.5–5.1)
Sodium: 140 mEq/L (ref 135–145)

## 2011-07-14 LAB — SURGICAL PCR SCREEN
MRSA, PCR: NEGATIVE
Staphylococcus aureus: NEGATIVE

## 2011-07-14 LAB — CBC
HCT: 36.7 % (ref 36.0–46.0)
Hemoglobin: 12.5 g/dL (ref 12.0–15.0)
MCH: 29.1 pg (ref 26.0–34.0)
MCHC: 34.1 g/dL (ref 30.0–36.0)
MCV: 85.5 fL (ref 78.0–100.0)
Platelets: 260 10*3/uL (ref 150–400)
RBC: 4.29 MIL/uL (ref 3.87–5.11)
RDW: 13.6 % (ref 11.5–15.5)
WBC: 11.2 10*3/uL — ABNORMAL HIGH (ref 4.0–10.5)

## 2011-07-14 LAB — PROTIME-INR
INR: 0.92 (ref 0.00–1.49)
Prothrombin Time: 12.6 seconds (ref 11.6–15.2)

## 2011-07-14 SURGERY — IMPLANTABLE CARDIOVERTER DEFIBRILLATOR IMPLANT
Anesthesia: LOCAL

## 2011-07-14 MED ORDER — POTASSIUM CHLORIDE CRYS ER 20 MEQ PO TBCR
40.0000 meq | EXTENDED_RELEASE_TABLET | ORAL | Status: AC
  Administered 2011-07-14: 40 meq via ORAL
  Filled 2011-07-14: qty 2

## 2011-07-14 MED ORDER — MIDAZOLAM HCL 2 MG/2ML IJ SOLN
INTRAMUSCULAR | Status: AC
  Filled 2011-07-14: qty 2

## 2011-07-14 MED ORDER — CALCIUM CARBONATE ANTACID 500 MG PO CHEW
1.0000 | CHEWABLE_TABLET | Freq: Every day | ORAL | Status: DC
  Filled 2011-07-14: qty 1

## 2011-07-14 MED ORDER — DEXTROMETHORPHAN POLISTIREX 30 MG/5ML PO LQCR
60.0000 mg | Freq: Two times a day (BID) | ORAL | Status: DC | PRN
  Filled 2011-07-14: qty 10

## 2011-07-14 MED ORDER — ALPRAZOLAM 0.25 MG PO TABS: 0.2500 mg | ORAL_TABLET | Freq: Two times a day (BID) | ORAL | Status: DC | PRN

## 2011-07-14 MED ORDER — BISOPROLOL FUMARATE 5 MG PO TABS
5.0000 mg | ORAL_TABLET | Freq: Every day | ORAL | Status: DC
  Filled 2011-07-14: qty 1

## 2011-07-14 MED ORDER — FENTANYL CITRATE 0.05 MG/ML IJ SOLN
INTRAMUSCULAR | Status: AC
  Filled 2011-07-14: qty 2

## 2011-07-14 MED ORDER — OMEPRAZOLE 20 MG PO CPDR
40.0000 mg | DELAYED_RELEASE_CAPSULE | Freq: Two times a day (BID) | ORAL | Status: DC
  Filled 2011-07-14 (×4): qty 2

## 2011-07-14 MED ORDER — ACETAMINOPHEN 325 MG PO TABS
325.0000 mg | ORAL_TABLET | ORAL | Status: DC | PRN
  Administered 2011-07-14 – 2011-07-15 (×3): 325 mg via ORAL
  Filled 2011-07-14: qty 1
  Filled 2011-07-14: qty 2
  Filled 2011-07-14: qty 1

## 2011-07-14 MED ORDER — POTASSIUM CHLORIDE 10 MEQ/100ML IV SOLN
INTRAVENOUS | Status: AC
  Filled 2011-07-14: qty 200

## 2011-07-14 MED ORDER — FUROSEMIDE 20 MG PO TABS
20.0000 mg | ORAL_TABLET | Freq: Every day | ORAL | Status: DC
  Filled 2011-07-14: qty 1

## 2011-07-14 MED ORDER — VANCOMYCIN HCL IN DEXTROSE 1-5 GM/200ML-% IV SOLN
1000.0000 mg | INTRAVENOUS | Status: AC
  Administered 2011-07-15: 1000 mg via INTRAVENOUS
  Filled 2011-07-14: qty 200

## 2011-07-14 MED ORDER — ISOSORBIDE DINITRATE 5 MG PO TABS
15.0000 mg | ORAL_TABLET | Freq: Two times a day (BID) | ORAL | Status: DC
  Administered 2011-07-14: 15 mg via ORAL
  Filled 2011-07-14: qty 1

## 2011-07-14 MED ORDER — POTASSIUM CHLORIDE CRYS ER 20 MEQ PO TBCR
40.0000 meq | EXTENDED_RELEASE_TABLET | Freq: Two times a day (BID) | ORAL | Status: DC
  Administered 2011-07-14: 40 meq via ORAL

## 2011-07-14 MED ORDER — METOLAZONE 2.5 MG PO TABS
2.5000 mg | ORAL_TABLET | Freq: Every day | ORAL | Status: DC | PRN
  Filled 2011-07-14: qty 1

## 2011-07-14 MED ORDER — ONDANSETRON HCL 4 MG/2ML IJ SOLN: 4.0000 mg | Freq: Four times a day (QID) | INTRAMUSCULAR | Status: DC | PRN

## 2011-07-14 MED ORDER — ATORVASTATIN CALCIUM 10 MG PO TABS: 10.0000 mg | ORAL_TABLET | Freq: Every day | ORAL | Status: DC

## 2011-07-14 MED ORDER — ATORVASTATIN CALCIUM 10 MG PO TABS
10.0000 mg | ORAL_TABLET | Freq: Every day | ORAL | Status: DC
  Filled 2011-07-14: qty 1

## 2011-07-14 MED ORDER — TORSEMIDE 20 MG PO TABS
20.0000 mg | ORAL_TABLET | Freq: Two times a day (BID) | ORAL | Status: DC
  Filled 2011-07-14 (×2): qty 1

## 2011-07-14 MED ORDER — HYDRALAZINE HCL 25 MG PO TABS
12.5000 mg | ORAL_TABLET | Freq: Three times a day (TID) | ORAL | Status: DC
  Filled 2011-07-14: qty 0.5

## 2011-07-14 MED ORDER — OMEPRAZOLE MAGNESIUM 20 MG PO TBEC: 40.0000 mg | DELAYED_RELEASE_TABLET | Freq: Every day | ORAL | Status: DC

## 2011-07-14 MED ORDER — ALPRAZOLAM 0.25 MG PO TABS
0.2500 mg | ORAL_TABLET | Freq: Two times a day (BID) | ORAL | Status: DC
  Administered 2011-07-14: 0.25 mg via ORAL

## 2011-07-14 MED ORDER — PANTOPRAZOLE SODIUM 40 MG PO TBEC: 80.0000 mg | DELAYED_RELEASE_TABLET | Freq: Every day | ORAL | Status: DC

## 2011-07-14 MED ORDER — ASPIRIN EC 81 MG PO TBEC
81.0000 mg | DELAYED_RELEASE_TABLET | Freq: Every day | ORAL | Status: DC
Start: 2011-07-14 — End: 2011-07-15
  Filled 2011-07-14 (×2): qty 1

## 2011-07-14 MED ORDER — VANCOMYCIN HCL IN DEXTROSE 1-5 GM/200ML-% IV SOLN
1000.0000 mg | INTRAVENOUS | Status: DC
  Filled 2011-07-14: qty 200

## 2011-07-14 MED ORDER — ATORVASTATIN CALCIUM 10 MG PO TABS
10.0000 mg | ORAL_TABLET | Freq: Every day | ORAL | Status: DC
  Administered 2011-07-14: 10 mg via ORAL
  Filled 2011-07-14 (×2): qty 1

## 2011-07-14 MED ORDER — LIDOCAINE HCL (PF) 1 % IJ SOLN
INTRAMUSCULAR | Status: AC
  Filled 2011-07-14: qty 60

## 2011-07-14 MED ORDER — MUPIROCIN 2 % EX OINT
TOPICAL_OINTMENT | Freq: Once | CUTANEOUS | Status: AC
  Administered 2011-07-14: 1 via NASAL
  Filled 2011-07-14: qty 22

## 2011-07-14 MED ORDER — HEPARIN (PORCINE) IN NACL 2-0.9 UNIT/ML-% IJ SOLN
INTRAMUSCULAR | Status: AC
  Filled 2011-07-14: qty 1000

## 2011-07-14 MED ORDER — POTASSIUM CHLORIDE ER 10 MEQ PO TBCR
10.0000 meq | EXTENDED_RELEASE_TABLET | Freq: Two times a day (BID) | ORAL | Status: DC
  Filled 2011-07-14 (×3): qty 1

## 2011-07-14 MED ORDER — ZOLPIDEM TARTRATE 5 MG PO TABS: 2.5000 mg | ORAL_TABLET | Freq: Every evening | ORAL | Status: DC | PRN

## 2011-07-14 MED ORDER — VANCOMYCIN HCL 1000 MG IV SOLR
1500.0000 mg | INTRAVENOUS | Status: DC
  Filled 2011-07-14: qty 1500

## 2011-07-14 MED ORDER — VITAMIN C 500 MG PO TABS
500.0000 mg | ORAL_TABLET | Freq: Every day | ORAL | Status: DC
  Filled 2011-07-14: qty 1

## 2011-07-14 NOTE — H&P (Signed)
HPI  Ashley [name] is returns today for ICD implantation. She is a pleasant 66 yo woman with a DCM and class 2 CHF. Her EF is 25%. The patient also has COPD and bronchiectasis on CT scan. She recently developed syncope. The spell sounds more like orthostasis as it occurred after standing, was not associated with palpitations and resolved after lying down. She has had additional near syncopal episodes. She denies peripheral edema. She had a cardiac MRI several weeks ago and 6 months after her initial presentation which demonstrated an EF34% despite maximal medical therapy. She is on hydralazine/isosorbide instead of an ACE inhibitor secondary to cough. Allergies   Allergen  Reactions   .  Antihistamines, Chlorpheniramine-Type  Other (See Comments)     Makes her eyes look like she sees strobe lights   .  Atorvastatin      REACTION: muscle cramps. Pt states she's taken this now   .  Influenza Vac Split (Flu Virus Vaccine)  Other (See Comments)     Body aches   .  Latex    .  Penicillins      REACTION: immune to it, able to take amoxicillin   .  Promethazine Hcl      REACTION: Dizzy, headache   .  Rosuvastatin      REACTION: not effective   .  Sulfasalazine     Current Outpatient Prescriptions   Medication  Sig  Dispense  Refill   .  ALPRAZolam (XANAX) 0.25 MG tablet  Take 1 tablet (0.25 mg total) by mouth 2 (two) times daily. Takes half a pill prn  30 tablet  0   .  aspirin 81 MG EC tablet  Take 81 mg by mouth at bedtime.     Marland Kitchen  atorvastatin (LIPITOR) 10 MG tablet  Take 1 tablet (10 mg total) by mouth daily. Brand medically necessary.  30 tablet  11   .  bisoprolol (ZEBETA) 5 MG tablet  Take 1 tablet (5 mg total) by mouth daily.  30 tablet  3   .  calcium carbonate (TUMS - DOSED IN MG ELEMENTAL CALCIUM) 500 MG chewable tablet  Chew 1 tablet by mouth daily. heartburn     .  Cholecalciferol (D3-1000) 1000 UNITS capsule  Take 1,000 Units by mouth daily.     Marland Kitchen  dextromethorphan (DELSYM) 30  MG/5ML liquid  2 tsp every 12 hours as needed     .  furosemide (LASIX) 20 MG tablet  Take 30 mg by mouth daily. Please check your weight daily, and get you BMP checked by Primary MD 1/week for 1 month.     .  hydrALAZINE (APRESOLINE) 25 MG tablet  Take 12.5 mg by mouth 3 (three) times daily.     Marland Kitchen  HYDROcodone-acetaminophen (VICODIN ES) 7.5-750 MG per tablet  Take 1 tablet by mouth every 6 (six) hours as needed. pain     .  isosorbide dinitrate (ISORDIL) 30 MG tablet  Take 1/2 tablet twice daily     .  metolazone (ZAROXOLYN) 2.5 MG tablet  Take 1 tablet (2.5 mg total) by mouth daily as needed.  30 tablet  6   .  mometasone (NASONEX) 50 MCG/ACT nasal spray  Place 1 spray into the nose 2 (two) times daily.     .  naproxen sodium (ANAPROX) 220 MG tablet  Per bottle     .  omeprazole (PRILOSEC OTC) 20 MG tablet  Take 40 mg by mouth daily.     Marland Kitchen  potassium chloride (K-DUR) 10 MEQ tablet  Take 1 tablet (10 mEq total) by mouth 2 (two) times daily. When on higher lasix dose or metolazone  60 tablet  3   .  vitamin C (ASCORBIC ACID) 500 MG tablet  Take 500 mg by mouth daily.     Marland Kitchen  zolpidem (AMBIEN) 5 MG tablet  1/2 to 1 tablet at bedtime as needed For insomnia      Past Medical History   Diagnosis  Date   .  Carpal tunnel syndrome    .  Migraine without aura, without mention of intractable migraine without mention of status migrainosus    .  Diarrhea    .  Diverticulosis of colon      no diverticulitis   .  Fibromyalgia    .  Pure hypercholesterolemia    .  Irritable bowel syndrome    .  Laryngeal nodule      Dr. Thelma Comp   .  Fibrocystic breast disease    .  Osteoarthritis    .  Hemorrhoids    .  Anxiety    .  GERD (gastroesophageal reflux disease)    .  Systolic CHF      EF 25-35%, LV dilation, significant MR   .  Nonischemic cardiomyopathy    .  Pneumonia    .  Mitral regurgitation    .  Anxiety    .  Pleural effusion, bilateral  2012   .  Bronchiectasis      mild, treat URIs  aggressively    ROS:  All systems reviewed and negative except as noted in the HPI.  Past Surgical History   Procedure  Date   .  Appendectomy    .  Mandible surgery    .  Knee arthroscopy w/ orif      Left   .  Osteotomy      Left foot   .  Breast cystectomies      due to FCBD   .  Total abdominal hysterectomy w/ bilateral salpingoophorectomy  1987     Endometriosis   .  Dexa  03/28/99     osteopenia L/S   .  Colonoscopy  1998     nml (sigmoid-Gilbert-nol)   .  Head mri, head ct  10/1998     ? H/A focus ([facility])   .  Emg/mcv  10/16/01     + mild carpal tunnel   .  Dexa  03/12/03     2.2 Spine 0.1 Hip Osteopenia   .  Mri right hip  12/12/01     Tendonitis Gluteus Medius to Gtr Trochanter Neg Bursitis (Dr. Montez Morita)   .  L/s films  11/21/01     nml; Right hip nml   .  Mri u/s  11/21/01     min. DDD L/3-4   .  Colonoscopy  01/04/04     polyps, diverticulosis/severe   .  Dexa  11/14/05     1.9 Spine 0.3 Hip slight improvement   .  Flex laryngoscopy  06/11/06     (Juengel) nml   .  Dexa  11/22/06     improved osteo and fem neck   .  Chevron bunionectomy  11/04/08     Right Great Toe (Dr. Lestine Box)   .  Breast mass excision  01/2010     Left-fibrocystic change w/intraductal papilloma, no malignancy   .  Ct chest  12/2010     possible bronchiectasis  lower lobes, 02/2011 - enlarged bilateral effusions, bibasilar atx   .  Stress myoview  02/2011     no significant ischemia   .  Ct chest  04/2011     no PE. abnormal R hilar and mediastinal adenopathy, rpt 3-6 mo   .  US echocardiography  04/2011     mildly dilated LV, EF 25-30%, hypokinesis, restrictive physiology, mod MR, no AS    Family History   Problem  Relation  Age of Onset   .  Lung cancer  Father       + smoker    .  Dementia  Mother    .  Osteoporosis  Mother       Lumbar spine    .  Stroke  Mother       x 5 @ 72 YOA    .  Arthritis  Mother       hands    .  Emphysema  Mother    .  Alcohol abuse  Paternal  Grandfather    .  Hypertension  Maternal Grandfather       ?    .  Stroke  Paternal Grandfather       ?    Marland Kitchen  Heart disease        grandmother    .  Heart disease        grandfather    History    Social History   .  Marital Status:  Married     Spouse Name:  N/A     Number of Children:  1   .  Years of Education:  N/A    Occupational History   .  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  Makes [employer]     Social History Main Topics   .  Smoking status:  Never Smoker   .  Smokeless tobacco:  Never Used     Comment: Lived with smokers x 23 yrs, smoked herself "for a week"    .  Alcohol Use:  No   .  Drug Use:  No   .  Sexually Active:  No    Other Topics  Concern   .  Not on file    Social History Narrative    1 adopted childLives with husband    BP 99/61  Pulse 76  Ht 5' 5.5" (1.664 m)  Wt 69.854 kg (154 lb)  BMI 25.24 kg/m2  Physical Exam:  Well appearing NAD  HEENT: Unremarkable  Neck: No JVD, no thyromegally  Lungs: Clear with no wheezes, rales, or rhonchi.  HEART: Regular rate rhythm, no murmurs, no rubs, no clicks  Abd: soft, positive bowel sounds, no organomegally, no rebound, no guarding  Ext: 2 plus pulses, no edema, no cyanosis, no clubbing  Skin: No rashes no nodules  Neuro: CN II through XII intact, motor grossly intact  EKG  NSR with normal intervals.  A/P 1. Chronic systolic CHF, class 2. 2. DCM, EF 34% despite maximal medical therapy 3. Remote syncope, etiology unclear Rec: I have discussed the risks/benefits/goals/expectations of ICD implant with the patient and she wishes to proceed.

## 2011-07-14 NOTE — Op Note (Signed)
ICD implant via the left subclavian without immediate complication. N#[id number].

## 2011-07-15 ENCOUNTER — Other Ambulatory Visit: Payer: Self-pay

## 2011-07-15 ENCOUNTER — Ambulatory Visit (HOSPITAL_COMMUNITY): Payer: Medicare Other

## 2011-07-15 DIAGNOSIS — Z95810 Presence of automatic (implantable) cardiac defibrillator: Secondary | ICD-10-CM | POA: Diagnosis not present

## 2011-07-15 DIAGNOSIS — J479 Bronchiectasis, uncomplicated: Secondary | ICD-10-CM | POA: Diagnosis not present

## 2011-07-15 DIAGNOSIS — I428 Other cardiomyopathies: Secondary | ICD-10-CM | POA: Diagnosis not present

## 2011-07-15 DIAGNOSIS — I5022 Chronic systolic (congestive) heart failure: Secondary | ICD-10-CM | POA: Diagnosis not present

## 2011-07-15 DIAGNOSIS — E876 Hypokalemia: Secondary | ICD-10-CM | POA: Diagnosis not present

## 2011-07-15 DIAGNOSIS — I509 Heart failure, unspecified: Secondary | ICD-10-CM | POA: Diagnosis not present

## 2011-07-15 LAB — BASIC METABOLIC PANEL
BUN: 38 mg/dL — ABNORMAL HIGH (ref 6–23)
CO2: 30 mEq/L (ref 19–32)
Calcium: 11 mg/dL — ABNORMAL HIGH (ref 8.4–10.5)
Chloride: 97 mEq/L (ref 96–112)
Creatinine, Ser: 1.08 mg/dL (ref 0.50–1.10)
GFR calc Af Amer: 61 mL/min — ABNORMAL LOW (ref >90)
GFR calc non Af Amer: 53 mL/min — ABNORMAL LOW (ref >90)
Glucose, Bld: 94 mg/dL (ref 70–99)
Potassium: 3.5 mEq/L (ref 3.5–5.1)
Sodium: 140 mEq/L (ref 135–145)

## 2011-07-15 LAB — MAGNESIUM: Magnesium: 2.1 mg/dL (ref 1.5–2.5)

## 2011-07-15 MED ORDER — POTASSIUM CHLORIDE ER 10 MEQ PO TBCR: 10.0000 meq | EXTENDED_RELEASE_TABLET | Freq: Two times a day (BID) | ORAL | Status: DC

## 2011-07-15 NOTE — Progress Notes (Signed)
@   Subjective:  Denies CP or dyspnea; sore at ICD site   Objective:  Filed Vitals:   07/15/11 0021 07/15/11 0455 07/15/11 0500 07/15/11 0756  BP: 99/52 101/49 101/49 103/44  Pulse: 74 71 67 65  Temp: 97.8 F (36.6 C) 97.8 F (36.6 C)  97.9 F (36.6 C)  TempSrc: Oral Oral  Oral  Resp:    18  Height:      Weight:  151 lb 7.3 oz (68.7 kg)    SpO2: 100% 100% 100% 100%    Intake/Output from previous day:  Intake/Output Summary (Last 24 hours) at 07/15/11 6962 Last data filed at 07/15/11 0224  Gross per 24 hour  Intake    680 ml  Output      1 ml  Net    679 ml    Physical Exam: Physical exam: Well-developed well-nourished in no acute distress.  Skin is warm and dry.  HEENT is normal.  Neck is supple. No thyromegaly.  Chest is clear to auscultation with normal expansion. ICD site without hematoma or erythema; mild tenderness to palpation. Cardiovascular exam is regular rate and rhythm.  Abdominal exam nontender or distended. No masses palpated. Extremities show no edema. neuro grossly intact    Lab Results: Basic Metabolic Panel:  Basename 07/15/11 0500 07/14/11 1227  NA 140 140  K 3.5 2.8*  CL 97 92*  CO2 30 32  GLUCOSE 94 96  BUN 38* 42*  CREATININE 1.08 1.22*  CALCIUM 11.0* 11.7*  MG 2.1 --  PHOS -- --   CBC:  Basename 07/14/11 1227  WBC 11.2*  NEUTROABS --  HGB 12.5  HCT 36.7  MCV 85.5  PLT 260     Assessment/Plan:  1) S/P ICD - DC today and fu Dr Ladona Ridgel for wound check; fu Dr Mariah Milling in [place] 2) Hypokalemia - supplement and check BMET 1 week after DC 3) NICM - continue preadmission meds at DC. No ACEI as she has allergy; continue hydralazine nitrates. D1 Olga Millers 07/15/2011, 8:21 AM

## 2011-07-15 NOTE — Discharge Summary (Signed)
Physician Discharge Summary  Patient ID: Ashley [name] MRN: [id number] DOB/AGE: Apr 29, 1946 66 y.o.  Admit date: 07/14/2011 Discharge date: 07/15/2011  Primary Cardiologist: Lennie Odor, MD Primary Electrophysiologist: Sharrell Ku, MD  Primary Discharge Diagnosis: 1 Nonischemic cardiomyopathy  - s/p single-chamber defibrillator  - EF 30-35%  - NYHA class II heart failure 2 Hypokalemia  Secondary Discharge Diagnoses: Past Medical History  Diagnosis Date  . Migraine without aura, without mention of intractable migraine without mention of status migrainosus   . Diarrhea   . Diverticulosis of colon     no diverticulitis  . Pure hypercholesterolemia   . Irritable bowel syndrome   . Laryngeal nodule     Dr. Thelma Comp  . Fibrocystic breast disease   . Osteoarthritis   . Hemorrhoids   . Anxiety   . GERD (gastroesophageal reflux disease)   . Systolic CHF     EF 25-35%, LV dilation, significant MR  . Nonischemic cardiomyopathy   . Pneumonia   . Mitral regurgitation   . Anxiety   . Pleural effusion, bilateral 2012  . Bronchiectasis     mild, treat URIs aggressively  . Hypertension   . Heart murmur   . CHF (congestive heart failure)   . Angina   . Lung disease 07/14/11    "my lungs are loaded w/scar tissue and my lung age is 51"  . Sleep apnea   . Shortness of breath     "resting"  . Anemia   . Fibromyalgia   . Depression     Reason for Admission: 66 year old female, with cardiac history as outlined above, who was admitted for elective ICD implantation, following recent MRI indicating calculated EF 34%, despite maximal medical therapy.  Procedures: Medtronic single chamber ICD implantation, by Dr. Sharrell Ku.  Hospital Course: Patient underwent successful implantation of a single-chamber ICD, on day of admission, by Dr. Sharrell Ku, with no noted complications. Postop device interrogation was completed. Wound site remained stable, with no obvious bleeding. Followup PCXR  indicated no complications.  Of note, Dr. Ladona Ridgel indicated that because of the patient's hypokalemia, defibrillation threshold testing was not carried out. Arrangements will be made for patient to have a followup BMET in one week, for close monitoring of hypokalemia.  Discharge Vitals: Blood pressure 103/44, pulse 65, temperature 97.9 F (36.6 C), temperature source Oral, resp. rate 18, height 5' 5.5" (1.664 m), weight 151 lb 7.3 oz (68.7 kg), SpO2 100.00%.  Labs: Lab Results  Component Value Date   WBC 11.2* 07/14/2011   HGB 12.5 07/14/2011   HCT 36.7 07/14/2011   MCV 85.5 07/14/2011   PLT 260 07/14/2011      Lab 07/15/11 0500  NA 140  K 3.5  CL 97  CO2 30  BUN 38*  CREATININE 1.08  CALCIUM 11.0*  ALBUMIN --  PROT --  BILITOT --  ALKPHOS --  ALT --  AST --  GLUCOSE 94    Lab Results  Component Value Date   CHOL 193 01/24/2011   HDL 73.50 01/24/2011   LDLCALC 82 01/24/2011   TRIG 187.0* 01/24/2011    No results found for this basename: DDIMER    Lab Results  Component Value Date   TSH 0.245* 04/29/2011    No results found for this basename: CKTOTAL:3,CKMB:3,TROPONINI:3 in the last 72 hours  Diagnostic Studies: Dg Chest 2 View  07/15/2011  *RADIOLOGY REPORT*  Clinical Data: 66 year old female - ICD placement.  CHEST - 2 VIEW  Comparison: 05/15/2011 chest radiograph and 06/12/2011  chest CT  Findings: The cardiomediastinal silhouette is stable. A left-sided AICD is noted with tip in the region of the right ventricle. There is no evidence of pneumothorax, pulmonary edema, airspace disease or pleural effusion. No acute bony abnormalities identified.  IMPRESSION: Left AICD placement without acute or complicating features.  Original Report Authenticated By: Rosendo Gros, M.D.   Mr Card Morphology Wo/w Cm  07/06/2011  *RADIOLOGY REPORT*  Clinical Data: Cardiomyopathy  MR CARDIA MORPHOLOGY WITHOUT AND WITH CONTRAST  GE 1.5 T magnet with dedicated cardiac coil.  FIESTA sequences for  function and morphology.  10 minutes after Multihance contrast was injected, inversion recovery sequences were done to assess for myocardial delayed enhancement.  EF was calculated at a dedicated workstation.  Contrast: 23mL MULTIHANCE GADOBENATE DIMEGLUMINE 529 MG/ML IV SOLN  Comparison: None.  Findings: Mildly dilated left ventricle with moderate systolic dysfunction, EF 34%.  Hypokinesis appeared global with minimal regional variation.  The right ventricle was mildly dilated with normal systolic function.  The left and right atria were both mildly dilated.  There appeared to be minimal mitral regurgitation on this study.  Trileaflet aortic valve with no stenosis or significant regurgitation.  On delayed enhancement images, there was no significant myocardial delayed enhancement.  IMPRESSION: 1. Mildly dilated left ventricle with global hypokinesis, EF 34%.  2. No myocardial delayed enhancement, so no definitive evidence for prior MI, myocarditis, or infiltrative disease.  Picture is most consistent with nonischemic cardiomyopathy.  Original Report Authenticated By: [id number]     DISPOSITION: Stable condition  FOLLOW UP PLANS AND APPOINTMENTS: Discharge Orders    Future Appointments: Provider: Department: Dept Phone: Center:   07/24/2011 2:45 PM Antonieta Iba, MD [facility] [phone] [facility]   07/31/2011 2:00 PM Max Fickle, MD [facility] [phone] None       DISCHARGE MEDICATIONS: Medication List  As of 07/15/2011  8:12 AM   ASK your doctor about these medications         ALPRAZolam 0.25 MG tablet   Commonly known as: XANAX   Take 0.25 mg by mouth 2 (two) times daily.      aspirin 81 MG EC tablet   Take 81 mg by mouth at bedtime.      atorvastatin 10 MG tablet   Commonly known as: LIPITOR   Take 10 mg by mouth daily. Brand medically necessary.      bisoprolol 5 MG tablet   Commonly known as: ZEBETA   Take 5 mg by mouth daily.      calcium carbonate 500  MG chewable tablet   Commonly known as: TUMS - dosed in mg elemental calcium   Chew 1 tablet by mouth daily. heartburn      D3-1000 1000 UNITS capsule   Generic drug: Cholecalciferol   Take 1,000 Units by mouth daily.      dextromethorphan 30 MG/5ML liquid   Commonly known as: DELSYM   Take 60 mg by mouth every 12 (twelve) hours as needed. For cough/congestion.      hydrALAZINE 25 MG tablet   Commonly known as: APRESOLINE   Take 12.5 mg by mouth 3 (three) times daily.      HYDROcodone-acetaminophen 7.5-750 MG per tablet   Commonly known as: VICODIN ES   Take 1 tablet by mouth every 6 (six) hours as needed. pain      isosorbide dinitrate 30 MG tablet   Commonly known as: ISORDIL   Take 15 mg by mouth 2 (two) times daily.  metolazone 2.5 MG tablet   Commonly known as: ZAROXOLYN   Take 2.5 mg by mouth every other day. Take one tablet every other day before torsemide      mometasone 50 MCG/ACT nasal spray   Commonly known as: NASONEX   Place 1 spray into the nose 2 (two) times daily.      omeprazole 20 MG tablet   Commonly known as: PRILOSEC OTC   Take 40 mg by mouth daily.      omeprazole 40 MG capsule   Commonly known as: PRILOSEC   Take 40 mg by mouth 2 (two) times daily.      potassium chloride 10 MEQ tablet   Commonly known as: K-DUR   Take 10-20 mEq by mouth daily. Take on days with metolazone.  Take on days without metolazone.      torsemide 10 MG tablet   Commonly known as: DEMADEX   Take 20 mg by mouth 2 (two) times daily.      vitamin C 500 MG tablet   Commonly known as: ASCORBIC ACID   Take 1,000 mg by mouth daily.      zolpidem 5 MG tablet   Commonly known as: AMBIEN   Take 2.5-5 mg by mouth at bedtime as needed. For insomnia              BRING ALL MEDICATIONS WITH YOU TO FOLLOW UP APPOINTMENTS  Time spent with patient to include physician time: Greater than 30 minutes, including physician time.  Signed: Gene Laporshia Hogen 07/15/2011,  8:12 AM Co-Sign MD

## 2011-07-15 NOTE — Discharge Summary (Signed)
See progress notes Demitrius Crass  

## 2011-07-15 NOTE — Op Note (Signed)
NAMEANJANA, Ashley [name]           ACCOUNT NO.:  [id number]  MEDICAL RECORD NO.:  [id number]  LOCATION:  2504                         FACILITY:  [facility]  PHYSICIAN:  Doylene Canning. Ladona Ridgel, MD    DATE OF BIRTH:  1946/02/25  DATE OF PROCEDURE:  07/14/2011 DATE OF DISCHARGE:                              OPERATIVE REPORT   SURGEON:  Doylene Canning. Ladona Ridgel, MD  PROCEDURE PERFORMED:  Insertion of a single-chamber defibrillator.  INDICATION:  Nonischemic cardiomyopathy with longstanding left ventricular dysfunction, class II heart failure, ejection fraction 30- 34%.  INTRODUCTION:  The patient is a 66 year old woman who presented approximately 6 months ago with congestive heart failure and was found to have an ejection fraction of 20%.  She was treated with maximal medical therapy and repeat echo demonstrated an EF of 30-35%, and a subsequent repeat MRI scan after 3 months of maximal medical therapy demonstrated an ejection fraction of 34%.  She has class II heart failure and is now referred for prophylactic single-chamber defibrillator insertion.  PROCEDURE:  After informed was obtained, the patient was taken to the Diagnostic EP Lab in the fasting state.  After usual preparation and draping, intravenous fentanyl and midazolam was given for sedation.  A 30 mL of lidocaine was infiltrated into the left infraclavicular region. A 7-cm incision was carried out over this region and electrocautery was utilized to dissect down to the deltopectoral groove.  The cephalic vein was cannulated without difficulty and the Medtronic model 6935 58-cm active fixation defibrillation lead, serial number [id number] was advanced into the right ventricle.  Mapping was carried out in the final site on the RV septum.  The R-waves were 12.  The impedance of 1100 and the threshold of 0.4 volts at 0.5 milliseconds with the lead actively fixed.  A large injury current was present.  With these satisfactory parameters, the  lead was secured to the subpectoral fascia with a figure- of-eight silk suture, and the sewing sleeve was secured with silk suture.  Electrocautery was utilized to make a subcutaneous pocket. Antibiotic irrigation was utilized to irrigate the pocket and electrocautery was utilized to assure hemostasis.  The Medtronic Secura single-chamber defibrillator serial number [id number] was connected to the defibrillation lead and placed back in the subcutaneous pocket.  The generator was secured with silk suture.  The pocket was irrigated with antibiotic irrigation and incision was closed with 2-0 and 3-0 Vicryl. A pressure dressing was applied after benzoin and Steri-Strips were painted on the skin, and the patient was returned to her room in satisfactory condition.  COMPLICATIONS:  There were no immediate procedure complications.  RESULTS:  This demonstrate successful implantation of a Medtronic single- chamber defibrillator in a patient with a nonischemic cardiomyopathy, chronic systolic heart failure.  Because of the patient's hypokalemia, defibrillation threshold testing was not carried out.     Doylene Canning. Ladona Ridgel, MD     GWT/MEDQ  D:  07/14/2011  T:  07/15/2011  Job:  [id number]  cc:   Antonieta Iba, MD

## 2011-07-18 ENCOUNTER — Other Ambulatory Visit: Payer: Medicare Other

## 2011-07-24 ENCOUNTER — Ambulatory Visit (INDEPENDENT_AMBULATORY_CARE_PROVIDER_SITE_OTHER): Payer: Medicare Other | Admitting: Cardiovascular Disease

## 2011-07-24 ENCOUNTER — Encounter: Payer: Self-pay | Admitting: *Deleted

## 2011-07-24 ENCOUNTER — Ambulatory Visit: Payer: Medicare Other | Admitting: Family Medicine

## 2011-07-24 ENCOUNTER — Ambulatory Visit: Payer: Medicare Other

## 2011-07-24 ENCOUNTER — Encounter: Payer: Self-pay | Admitting: Cardiovascular Disease

## 2011-07-24 DIAGNOSIS — R5383 Other fatigue: Secondary | ICD-10-CM

## 2011-07-24 DIAGNOSIS — I5020 Unspecified systolic (congestive) heart failure: Secondary | ICD-10-CM | POA: Diagnosis not present

## 2011-07-24 DIAGNOSIS — R079 Chest pain, unspecified: Secondary | ICD-10-CM | POA: Diagnosis not present

## 2011-07-24 DIAGNOSIS — R0602 Shortness of breath: Secondary | ICD-10-CM

## 2011-07-24 DIAGNOSIS — I5023 Acute on chronic systolic (congestive) heart failure: Secondary | ICD-10-CM

## 2011-07-24 DIAGNOSIS — R5381 Other malaise: Secondary | ICD-10-CM | POA: Diagnosis not present

## 2011-07-24 DIAGNOSIS — Z79899 Other long term (current) drug therapy: Secondary | ICD-10-CM | POA: Diagnosis not present

## 2011-07-24 DIAGNOSIS — R110 Nausea: Secondary | ICD-10-CM

## 2011-07-24 MED ORDER — CARVEDILOL 3.125 MG PO TABS: 3.1250 mg | ORAL_TABLET | Freq: Two times a day (BID) | ORAL | Status: DC

## 2011-07-24 NOTE — Patient Instructions (Addendum)
Start coreg 3.125 mg twice a day Add miralex or citracel, prune juice. Stop bisoprolol  Please call us if you have new issues that need to be addressed before your next appt.  Your physician wants you to follow-up in: 3 months.  You will receive a reminder letter in the mail two months in advance. If you don't receive a letter, please call our office to schedule the follow-up appointment.

## 2011-07-24 NOTE — Assessment & Plan Note (Signed)
Etiology of her nausea is uncertain. No improvement with diuresis arguing against cardiac component. She previously had EGD scheduled and it was canceled secondary to underlying pneumonia. We have suggested she discuss with Dr. Sharen Hones whether this might be indicated. Could consider right upper quadrant ultrasound to exclude gallstones if this has not been done.

## 2011-07-24 NOTE — Assessment & Plan Note (Signed)
Uncertain if some of her fatigue could be secondary to depression. She does have a history of fibromyalgia. We will try very his medication changes to see if this helps with her symptoms

## 2011-07-24 NOTE — Assessment & Plan Note (Signed)
Heart failure symptoms seem to have improved with weight loss. Goal weight is around 150 pounds. Recent blood work suggest mild dehydration and she has decreased her torsemide. Currently she takes torsemide 10 mg 3 times a day.

## 2011-07-24 NOTE — Progress Notes (Signed)
Patient ID: Ashley [name], female    DOB: [birth year], 65 y.o.   MRN: [id number]  HPI Comments: 66 y/o female with history of pneumonia, chronic cough, diagnosis of COPD made during a hospital course at [facility], recent shortness of breath since September 2012 that presented acutely on September 1, CT scan showing progressive pleural effusion on a repeat scan , echo showing Cardiomyopathy  ejection fraction estimated at 25-35%,  Previous  [employer]  showed no significant ischemia, no EKG changes concerning for ischemia.   Follow up admission to [facility] in December 2012 for bronchitis and hypoxia as well as systolic CHF. She had antibiotics, prednisone and diuresis and has felt better since discharge.  She reports that she continues to need oxygen, particularly with exertion. Previous weight was in the high 150s. Recently she has been in the low 150 range. 6 pound weight loss since her last clinic visit . She feels dehydrated and has been cutting back on her torsemide. In general, she has not been able to tolerate large doses of diuretics as it causes her mouth to get dry. She feels fatigue all of the time and wonders if it could be secondary bisoprolol. She has significant stomach trouble on a regular basis.  She missed having her EGD done last year secondary to pneumonia. ACE inhibitor is on hold secondary to chronic cough.   S/P ICD 10 days ago for EF <35%. She has follow up with EP later this week.   Echocardiogram on May 02 2011 showed moderate pulmonary hypertension, ejection fraction 25%.  Cardiac MRI showed ejection fraction 34%  EKG shows normal sinus rhythm with rate 77 beats per minute with no significant ST or T wave changes      Outpatient Encounter Prescriptions as of 07/24/2011  Medication Sig Dispense Refill  . ALPRAZolam (XANAX) 0.25 MG tablet Take 0.25 mg by mouth 2 (two) times daily.      Marland Kitchen aspirin 81 MG EC tablet Take 81 mg by mouth at bedtime.        Marland Kitchen atorvastatin (LIPITOR) 10 MG tablet Take 10 mg by mouth daily. Brand medically necessary.      . Cholecalciferol (D3-1000) 1000 UNITS capsule Take 1,000 Units by mouth daily.      Marland Kitchen dextromethorphan (DELSYM) 30 MG/5ML liquid Take 60 mg by mouth every 12 (twelve) hours as needed. For cough/congestion.      . hydrALAZINE (APRESOLINE) 25 MG tablet Take 12.5 mg by mouth 3 (three) times daily.        Marland Kitchen HYDROcodone-acetaminophen (VICODIN ES) 7.5-750 MG per tablet Take 1 tablet by mouth every 6 (six) hours as needed. pain      . isosorbide dinitrate (ISORDIL) 30 MG tablet Take 15 mg by mouth 2 (two) times daily.       . metolazone (ZAROXOLYN) 2.5 MG tablet Take 2.5 mg by mouth every other day. Take one tablet every other day before torsemide      . potassium chloride (K-DUR) 10 MEQ tablet Take 1 tablet (10 mEq total) by mouth 2 (two) times daily. When on higher lasix dose or metolazone  60 tablet  6  . torsemide (DEMADEX) 10 MG tablet Take 20 mg by mouth 3 (three) times daily.       . vitamin C (ASCORBIC ACID) 500 MG tablet Take 1,000 mg by mouth daily.      Marland Kitchen zolpidem (AMBIEN) 5 MG tablet Take 2.5-5 mg by mouth at bedtime as needed. For insomnia       .  bisoprolol (ZEBETA) 5 MG tablet Take 5 mg by mouth daily.        Review of Systems  Constitutional: Positive for fatigue.       On 2 L nasal cannula oxygen  HENT: Negative.   Eyes: Negative.   Respiratory: Positive for shortness of breath.   Cardiovascular: Negative.   Gastrointestinal: Negative.   Musculoskeletal: Negative.   Skin: Negative.   Neurological: Negative.   Hematological: Negative.   Psychiatric/Behavioral: Negative.   All other systems reviewed and are negative.     BP 100/68  Pulse 77  Ht 5' 5.5" (1.664 m)  Wt 150 lb 8 oz (68.266 kg)  BMI 24.66 kg/m2  Physical Exam  Nursing note and vitals reviewed. Constitutional: She is oriented to person, place, and time. She appears well-developed and well-nourished.  HENT:    Head: Normocephalic.  Nose: Nose normal.  Mouth/Throat: Oropharynx is clear and moist.  Eyes: Conjunctivae are normal. Pupils are equal, round, and reactive to light.  Neck: Normal range of motion. Neck supple. No JVD present.  Cardiovascular: Normal rate, regular rhythm, S1 normal, S2 normal, normal heart sounds and intact distal pulses.  Exam reveals no gallop and no friction rub.   No murmur heard. Pulmonary/Chest: Effort normal and breath sounds normal. No respiratory distress. She has no wheezes. She has no rales. She exhibits no tenderness.  Abdominal: Soft. Bowel sounds are normal. She exhibits no distension. There is no tenderness.  Musculoskeletal: Normal range of motion. She exhibits no edema and no tenderness.  Lymphadenopathy:    She has no cervical adenopathy.  Neurological: She is alert and oriented to person, place, and time. Coordination normal.  Skin: Skin is warm and dry. No rash noted. No erythema.  Psychiatric: She has a normal mood and affect. Her behavior is normal. Judgment and thought content normal.         Assessment and Plan

## 2011-07-24 NOTE — Assessment & Plan Note (Signed)
Clinically, shortness of breath seems to have improved. She does have chronic fatigue of uncertain etiology. At [place], we will try a alternate beta blocker and change to Coreg 3.125 mg twice a day.

## 2011-07-25 ENCOUNTER — Other Ambulatory Visit: Payer: Medicare Other

## 2011-07-25 LAB — BASIC METABOLIC PANEL
BUN/Creatinine Ratio: 50 — ABNORMAL HIGH (ref 11–26)
BUN: 61 mg/dL — ABNORMAL HIGH (ref 8–27)
CO2: 27 mmol/L (ref 20–32)
Calcium: 10.9 mg/dL — ABNORMAL HIGH (ref 8.6–10.2)
Chloride: 85 mmol/L — ABNORMAL LOW (ref 97–108)
Creatinine, Ser: 1.23 mg/dL — ABNORMAL HIGH (ref 0.57–1.00)
GFR calc Af Amer: 53 mL/min/{1.73_m2} — ABNORMAL LOW (ref >59)
GFR calc non Af Amer: 46 mL/min/{1.73_m2} — ABNORMAL LOW (ref >59)
Glucose: 80 mg/dL (ref 65–99)
Potassium: 3.4 mmol/L — ABNORMAL LOW (ref 3.5–5.2)
Sodium: 136 mmol/L (ref 134–144)

## 2011-07-27 ENCOUNTER — Other Ambulatory Visit: Payer: Medicare Other

## 2011-07-27 ENCOUNTER — Encounter: Payer: Self-pay | Admitting: Internal Medicine

## 2011-07-27 ENCOUNTER — Ambulatory Visit (INDEPENDENT_AMBULATORY_CARE_PROVIDER_SITE_OTHER): Payer: Medicare Other | Admitting: *Deleted

## 2011-07-27 DIAGNOSIS — R000 Tachycardia, unspecified: Secondary | ICD-10-CM

## 2011-07-27 DIAGNOSIS — I428 Other cardiomyopathies: Secondary | ICD-10-CM | POA: Diagnosis not present

## 2011-07-27 LAB — ICD DEVICE OBSERVATION
BATTERY VOLTAGE: 3.2045 V
BRDY-0002RV: 40 {beats}/min
CHARGE TIME: 8.918 s
DEV-0020ICD: NEGATIVE
FVT: 0
HV IMPEDENCE: 70 Ohm
PACEART VT: 0
RV LEAD AMPLITUDE: 10 mv
RV LEAD IMPEDENCE ICD: 779 Ohm
RV LEAD THRESHOLD: 0.75 V
TOT-0001: 0
TOT-0002: 0
TOT-0006: 20130301000000
TZAT-0001FASTVT: 1
TZAT-0001SLOWVT: 1
TZAT-0002FASTVT: NEGATIVE
TZAT-0002SLOWVT: NEGATIVE
TZAT-0012FASTVT: 170 ms
TZAT-0012SLOWVT: 170 ms
TZAT-0018FASTVT: NEGATIVE
TZAT-0018SLOWVT: NEGATIVE
TZAT-0019FASTVT: 8 V
TZAT-0019SLOWVT: 8 V
TZAT-0020FASTVT: 1.5 ms
TZAT-0020SLOWVT: 1.5 ms
TZON-0003SLOWVT: 360 ms
TZON-0003VSLOWVT: 450 ms
TZON-0004SLOWVT: 16
TZON-0004VSLOWVT: 20
TZON-0005SLOWVT: 12
TZST-0001FASTVT: 2
TZST-0001FASTVT: 3
TZST-0001FASTVT: 4
TZST-0001FASTVT: 5
TZST-0001FASTVT: 6
TZST-0001SLOWVT: 2
TZST-0001SLOWVT: 3
TZST-0001SLOWVT: 4
TZST-0001SLOWVT: 5
TZST-0001SLOWVT: 6
TZST-0002FASTVT: NEGATIVE
TZST-0002FASTVT: NEGATIVE
TZST-0002FASTVT: NEGATIVE
TZST-0002FASTVT: NEGATIVE
TZST-0002FASTVT: NEGATIVE
TZST-0002SLOWVT: NEGATIVE
TZST-0002SLOWVT: NEGATIVE
TZST-0002SLOWVT: NEGATIVE
TZST-0002SLOWVT: NEGATIVE
TZST-0002SLOWVT: NEGATIVE
VENTRICULAR PACING ICD: 0.01 pct
VF: 0

## 2011-07-27 NOTE — Progress Notes (Signed)
Wound check defib in clinic  

## 2011-07-28 ENCOUNTER — Telehealth: Payer: Self-pay

## 2011-07-28 NOTE — Telephone Encounter (Signed)
When can she go back to respiratory physical therapy on Mon. Wed and Fridays? She will need a note stating she can do physical exercise.

## 2011-07-28 NOTE — Telephone Encounter (Signed)
We can write a note saying it is okay to go back to rehabilitation. Okay for her to take calcium

## 2011-07-28 NOTE — Telephone Encounter (Signed)
Patient also wants to take calcium.

## 2011-07-31 ENCOUNTER — Ambulatory Visit (INDEPENDENT_AMBULATORY_CARE_PROVIDER_SITE_OTHER): Payer: Medicare Other | Admitting: Pulmonary Disease

## 2011-07-31 ENCOUNTER — Encounter: Payer: Self-pay | Admitting: Pulmonary Disease

## 2011-07-31 DIAGNOSIS — J9611 Chronic respiratory failure with hypoxia: Secondary | ICD-10-CM | POA: Insufficient documentation

## 2011-07-31 DIAGNOSIS — I428 Other cardiomyopathies: Secondary | ICD-10-CM

## 2011-07-31 DIAGNOSIS — J479 Bronchiectasis, uncomplicated: Secondary | ICD-10-CM

## 2011-07-31 DIAGNOSIS — R590 Localized enlarged lymph nodes: Secondary | ICD-10-CM

## 2011-07-31 DIAGNOSIS — J9610 Chronic respiratory failure, unspecified whether with hypoxia or hypercapnia: Secondary | ICD-10-CM

## 2011-07-31 DIAGNOSIS — R599 Enlarged lymph nodes, unspecified: Secondary | ICD-10-CM

## 2011-07-31 MED ORDER — LEVOFLOXACIN 500 MG PO TABS: 500.0000 mg | ORAL_TABLET | Freq: Every day | ORAL | Status: AC

## 2011-07-31 NOTE — Patient Instructions (Addendum)
Only start the levaquin if you have green sputum for more than three days in a row.  Complete the course of antibiotic as written. Continue taking your other medications. We will see you back in 3 months or sooner if needed. Continue to use 2 L O2 on exertion.  Do not need O2 at rest.

## 2011-07-31 NOTE — Assessment & Plan Note (Addendum)
This is mild at best, and likely was due to her prior volume overload.  Her room air O2 sat at rest was 97% and unfortunately she left before we could repeat this test today.  We advised her that she does not need to use this at rest.    I think that she gets some sort of psychologic benefit from having the O2 nearby, as she freely admitted to me in clinic today when I explained to her that she doesn't need it at rest.  For now I think she needs 2 liters on exertion, but does not need it at rest.

## 2011-07-31 NOTE — Assessment & Plan Note (Signed)
Now s/p ICD.  Appears to be euvolemic today.  This is actually the first time that I have seen Ms. Ashley [name] appear remotely healthy in clinic, so I think we are making some headway. This is primarily due to the excellent cardiac care directed by Dr. Mariah Milling.

## 2011-07-31 NOTE — Telephone Encounter (Signed)
Notified patient letter for rehab is available to pick up and told patient okay to take calcium. The patient will pick up the letter today.

## 2011-07-31 NOTE — Progress Notes (Signed)
Subjective:    Patient ID: Ashley [name], female    DOB: 1945-10-19, 66 y.o.   MRN: [id number] Synopsis: 66 y/o female with chronic cough, intermittent wheezing, and multiple hospitalizations for pneumonia presented to our clinic initially on 02/20/11 for evaluation of the same. She has seen an allergist for years for allergic rhinitis and had been told that she did not have asthma. We ordered an echocardiogram which showed significanly decreased LV function. She underwent a cardiac myoview which showed no evidence of ischaemia. She has been seen in our [place] office multiple times for cough since. She has not taken the chlortrimeton we recommended. She was seen by a gastroenterologist in the interim who recommended that she continue taking pepcid and famotidine. In approximately the end of 10/12 she was put on asthmanex by her allergist. On 11/12 she was given a prenisone taper for continued cough.   HPI    04/03/11 ROV --Ashley [name] has had a busy 6 weeks since our last visit as detailed above. She has multiple complaints today, but her primary respiratory complaints are wheezing, shortness of breath, and continuous cough productive of clear sputum. She says that the symptoms are worse at night when she lies flat, so she has to prop her head up. She is a little more swollen than usual. She says that the tramadol makes her stomach hurt. She doesn't think that the pepcid helps. Her allergist put her on Asthmanex but she doesn't think that it has helped. She was put on a prednisone taper last week and she says that this has not helped her wheeze or shortness of breath at all. She wheezes nearly continuously. No fevers or chills.  She notes that she frequently wakes up many times during the night choking and gasping for air. Her husband says that she stops breathing at times.  She also notes waking up choking frequently in the middle of the night.  Her sinuses have been well controlled recently.    04/26/11 ROV -- Ashley [name] returns for further evaluation of shortness of breath and cough. She states that she has had chills at home in the last week, associated with sorethroat, cough, wheezing (especially at night), clear sputum production, and chest pain with cough. She says that she has woken up multiple times at night with wheezing which is relieved with albuterol.  She stopped taking the bystolic and feels better now (less short of breath). She has also been taking more furosemide per Dr. Windell Hummingbird instructions.   05/24/11 ROV/Hosp f/u-- She reports that she is doing fairly well after her recent hospitalization for volume overload and cough. Her cough has improved quite a bit since then and she is not as short of breath since starting on oxygen. She only has scant clear sputum production. She notes that she does not urinate much on her current dose of lasix. She is taking the lasix and metolazone as written. She stopped taking the alvesco and nebulized meds.  07/31/2011 ROV-- She is doing fairly well. She has some green sputum and sinus congestion on occasion, worse in the last three weeks.  No fevers or chills or change in baseline dyspnea.  For the most part she feels her breathing has improved. She has had some eratic blood pressures at home last week.   She is adjusting her lasix for a goal weight of 150 lbs.   Past Medical History  Diagnosis Date  . Migraine without aura, without mention of intractable migraine without mention of  status migrainosus   . Diarrhea   . Diverticulosis of colon     no diverticulitis  . Pure hypercholesterolemia   . Irritable bowel syndrome   . Laryngeal nodule     Dr. Thelma Comp  . Fibrocystic breast disease   . Osteoarthritis   . Hemorrhoids   . Anxiety   . GERD (gastroesophageal reflux disease)   . Systolic CHF     EF 25-35%, LV dilation, significant MR  . Nonischemic cardiomyopathy   . Pneumonia   . Mitral regurgitation   . Anxiety   . Pleural  effusion, bilateral 2012  . Bronchiectasis     mild, treat URIs aggressively  . Hypertension   . Heart murmur   . CHF (congestive heart failure)   . Angina   . Lung disease 07/14/11    "my lungs are loaded w/scar tissue and my lung age is 74"  . Sleep apnea   . Shortness of breath     "resting"  . Anemia   . Fibromyalgia   . Depression       Review of Systems  Constitutional: Negative for chills, activity change, appetite change and fatigue.  Respiratory: Positive for cough. Negative for choking, chest tightness and wheezing.   Cardiovascular: Negative for chest pain, palpitations and leg swelling.       Objective:   Physical Exam Filed Vitals:   07/31/11 1338  BP: 102/60  Pulse: 99  Temp: 98.1 F (36.7 C)  TempSrc: Oral  Height: 5' 5.5" (1.664 m)  Weight: 153 lb 12.8 oz (69.763 kg)  SpO2: 99%   Gen:  no acute distress HEENT: NCAT, PERRL, EOMi, OP clear, neck supple without masses PULM: Few insp wheezes in bases, otherwise clear CV: RRR, no mgr, no JVD Chest: ICD pocket is not swollen tender or red Ext: warm, no edema, no clubbing, no cyanosis  12/14 /12 CT Thorax: mediastinal lymphadenopathy, question right hilar mass, mild bronchiectasis in bases, pleural effusion with increased interlobular thickening  06/12/11 CT thorax: IMPRESSION:  Interval clearing of congestive heart failure and associated  reactive mediastinal and bihilar adenopathy. No evidence of  interstitial lung disease.     Assessment & Plan:   Bronchiectasis She has had a slight increase in her sputum production in the last few weeks with green phlegm.  She is well appearing today but considering her mild bronchiectasis and that she is about to go out of town will treat with Levaquin for a 10 day course.  Advised to complete course and be on the look out for tendon pain/discomfort while on the quinolone.  Hypoxemic respiratory failure, chronic This is mild at best, and likely was due to her  prior volume overload.  Her room air O2 sat at rest was 97% and unfortunately she left before we could repeat this test today.  We advised her that she does not need to use this at rest.    I think that she gets some sort of psychologic benefit from having the O2 nearby, as she freely admitted to me in clinic today when I explained to her that she doesn't need it at rest.  For now I think she needs 2 liters on exertion, but does not need it at rest.  Cardiomyopathy Now s/p ICD.  Appears to be euvolemic today.  This is actually the first time that I have seen Ashley [name] appear remotely healthy in clinic, so I think we are making some headway. This is primarily due to the excellent  cardiac care directed by Dr. Mariah Milling.  Mediastinal lymphadenopathy Resolved on most recent CT imaging.  As suspected, this was due to her volume overload.     Updated Medication List Outpatient Encounter Prescriptions as of 07/31/2011  Medication Sig Dispense Refill  . ALPRAZolam (XANAX) 0.25 MG tablet Take 0.25 mg by mouth 2 (two) times daily.      Marland Kitchen aspirin 81 MG EC tablet Take 81 mg by mouth at bedtime.       Marland Kitchen atorvastatin (LIPITOR) 10 MG tablet Take 10 mg by mouth daily. Brand medically necessary.      Marland Kitchen CALCIUM-VITAMIN D PO Take 1 capsule by mouth daily.      . carvedilol (COREG) 3.125 MG tablet Take 1 tablet (3.125 mg total) by mouth 2 (two) times daily.  60 tablet  11  . dextromethorphan (DELSYM) 30 MG/5ML liquid Take 60 mg by mouth every 12 (twelve) hours as needed. For cough/congestion.      . hydrALAZINE (APRESOLINE) 25 MG tablet Take 12.5 mg by mouth 3 (three) times daily.        . isosorbide dinitrate (ISORDIL) 30 MG tablet Take 15 mg by mouth 2 (two) times daily.       . metolazone (ZAROXOLYN) 2.5 MG tablet Take 2.5 mg by mouth every other day. Take one tablet every other day before torsemide      . potassium chloride (K-DUR) 10 MEQ tablet Take 1 tablet (10 mEq total) by mouth 2 (two) times daily.  When on higher lasix dose or metolazone  60 tablet  6  . torsemide (DEMADEX) 10 MG tablet Take 10 mg by mouth 2 (two) times daily.       . vitamin C (ASCORBIC ACID) 500 MG tablet Take 1,000 mg by mouth daily.      Marland Kitchen zolpidem (AMBIEN) 5 MG tablet Take 2.5-5 mg by mouth at bedtime as needed. For insomnia       . levofloxacin (LEVAQUIN) 500 MG tablet Take 1 tablet (500 mg total) by mouth daily.  10 tablet  0  . DISCONTD: Cholecalciferol (D3-1000) 1000 UNITS capsule Take 1,000 Units by mouth daily.      Marland Kitchen DISCONTD: HYDROcodone-acetaminophen (VICODIN ES) 7.5-750 MG per tablet Take 1 tablet by mouth every 6 (six) hours as needed. pain

## 2011-07-31 NOTE — Assessment & Plan Note (Signed)
Resolved on most recent CT imaging.  As suspected, this was due to her volume overload.

## 2011-07-31 NOTE — Assessment & Plan Note (Signed)
She has had a slight increase in her sputum production in the last few weeks with green phlegm.  She is well appearing today but considering her mild bronchiectasis and that she is about to go out of town will treat with Levaquin for a 10 day course.  Advised to complete course and be on the look out for tendon pain/discomfort while on the quinolone.

## 2011-08-02 ENCOUNTER — Telehealth: Payer: Self-pay | Admitting: Internal Medicine

## 2011-08-02 NOTE — Telephone Encounter (Signed)
Had ICD implant on 07/14/2011 Needs to have teeth cleaned and may have to have a root canal done ---when can she have this done?  She has not rescheduled her appt yet  Was due for 6 months cleaning in 02/2011.  She has a skin tag on her face and was using a electro magnetic neddle and needs to have the rest of it removed It is going to be removed with liquid nitro I let her know I would discuss with Dr Ladona Ridgel and call her if any thing is different than what I have told her

## 2011-08-02 NOTE — Telephone Encounter (Signed)
Pt calling re main artery in her neck is larger than the one on the other side, it  aches at times, she has a dental appt due, when can she go? And has growth on face, wants to know when she can see the dermatologist and what treatment is ok for her to have?

## 2011-08-03 ENCOUNTER — Telehealth: Payer: Self-pay | Admitting: Internal Medicine

## 2011-08-03 ENCOUNTER — Other Ambulatory Visit: Payer: Self-pay | Admitting: *Deleted

## 2011-08-03 MED ORDER — ALPRAZOLAM 0.25 MG PO TABS: 0.2500 mg | ORAL_TABLET | Freq: Two times a day (BID) | ORAL | Status: DC | PRN

## 2011-08-03 NOTE — Telephone Encounter (Signed)
plz phone in. 

## 2011-08-03 NOTE — Telephone Encounter (Signed)
[id number] [place], [place] - [address] [phone]  Please return call to Pharmacist- Caryn Bee, patient said Tresa Endo told her this RX- levofloxacin (LEVAQUIN) 500 MG tablet will be free.  Caryn Bee is trying to find out how to handle this situation, Is there a coupon or samples maybe that she was suppose to receive.  Please call pharmacist back to advise, he can be reached at [phone].

## 2011-08-03 NOTE — Telephone Encounter (Signed)
Rx called in as directed.   

## 2011-08-03 NOTE — Telephone Encounter (Signed)
Spoke with Robin at pharmacy  I have not told the pt her medication is free.  I have told her that she will need SBE coverage for 3 months post implant and to have her dentist call me with questions  Patient aware

## 2011-08-03 NOTE — Telephone Encounter (Signed)
OK to refill

## 2011-08-08 NOTE — Telephone Encounter (Signed)
Dr Ladona Ridgel agreeable with plan

## 2011-08-09 ENCOUNTER — Other Ambulatory Visit: Payer: Self-pay | Admitting: *Deleted

## 2011-08-09 ENCOUNTER — Institutional Professional Consult (permissible substitution): Payer: Medicare Other | Admitting: Internal Medicine

## 2011-08-09 MED ORDER — POTASSIUM CHLORIDE ER 10 MEQ PO TBCR: 10.0000 meq | EXTENDED_RELEASE_TABLET | Freq: Two times a day (BID) | ORAL | Status: DC

## 2011-08-09 MED ORDER — TORSEMIDE 10 MG PO TABS: 10.0000 mg | ORAL_TABLET | Freq: Two times a day (BID) | ORAL | Status: DC

## 2011-08-09 MED ORDER — METOLAZONE 2.5 MG PO TABS: 2.5000 mg | ORAL_TABLET | ORAL | Status: DC

## 2011-08-09 MED ORDER — ISOSORBIDE DINITRATE 30 MG PO TABS: 15.0000 mg | ORAL_TABLET | Freq: Two times a day (BID) | ORAL | Status: DC

## 2011-08-10 ENCOUNTER — Telehealth: Payer: Self-pay | Admitting: Family Medicine

## 2011-08-10 ENCOUNTER — Telehealth: Payer: Self-pay | Admitting: Cardiovascular Disease

## 2011-08-10 NOTE — Telephone Encounter (Signed)
Message left for patient to return my call. Advised she needed to be seen by cardiology for this. Advised if she was unable to see them, then she needed to go to ER to make sure device was working properly and to make sure nothing else was wrong. Will await return call.

## 2011-08-10 NOTE — Telephone Encounter (Signed)
Triage Record Num: [id number] Operator: Lodema Pilot Patient Name: [facility] Call Date & Time: 08/10/2011 9:20:11AM Patient Phone: [phone] PCP: [name] Patient Gender: Female PCP Fax : [phone] Patient DOB: Jan 02, 1946 Practice Name: [facility] Reason for Call: Caller: Sophiagrace/Patient; PCP: [name]; CB#: [phone]; ; ; Call regarding Fatigue THE PATIENT REFUSED 911; Onset: 07/14/11 - Pacer/Defib was placed. Pt is wanting to sleep all the time since pacer was placed. Chest pain off and on during the last wk. No chest pain today 08/10/11. Afebrile. BP 109/57, HR 92. Per Fatigue Protocol, advised to see MD w/in 24 hours. No appt available at office - Office is closed on 08/11/11. Spoke with Jona at office, advised UC. Pt will not go to [facility]. Pt will contact Cardiologist. Care advice given. Protocol(s) Used: Fatigue Recommended Outcome per Protocol: See Provider within 24 hours Reason for Outcome: Persistent fatigue that came on suddenly without known cause. Care Advice: ~ Reduce physical stressors as much as possible. ~ Eat a balanced, low-fat, high-fiber diet and have regular meals. ~ Do not change medications or dosing regimen until provider is consulted. ~ SYMPTOM / CONDITION MANAGEMENT ~ CAUTIONS ~ Speak with your provider about other symptoms that you may have; also tell them if the symptoms worsened. Call EMS 911 immediately if you have chest pain lasting more than few minutes, SOB, faintness, sweating, nausea or vomiting. ~ 08/10/2011 9:51:39AM Page 1 of 1 [id number]

## 2011-08-10 NOTE — Telephone Encounter (Signed)
Ashley [name] is requesting that lab be ordered to check for anemia.  She states she has felt fatigued since being diagnosed with anemia back in Dec.  She has also been having nosebleeds and blowing out clots of blood since Sept.

## 2011-08-10 NOTE — Telephone Encounter (Signed)
She may want to talk with Dr. Sharen Hones or whoever is watching her blood count.  If her number is low, they would be the ones actively managing this

## 2011-08-10 NOTE — Telephone Encounter (Signed)
Pt feeling tired wants to know if she can have some labs done to see if she is anemic

## 2011-08-10 NOTE — Telephone Encounter (Signed)
Noted.  Can we call for update?  Is pt able to make appt this week with cards?  If not and continued fatigue, may make appt with me for next week.

## 2011-08-11 ENCOUNTER — Other Ambulatory Visit: Payer: Medicare Other

## 2011-08-14 ENCOUNTER — Telehealth: Payer: Self-pay | Admitting: Family Medicine

## 2011-08-14 ENCOUNTER — Encounter: Payer: Self-pay | Admitting: Family Medicine

## 2011-08-14 ENCOUNTER — Encounter: Payer: Self-pay | Admitting: Pulmonary Disease

## 2011-08-14 ENCOUNTER — Ambulatory Visit (INDEPENDENT_AMBULATORY_CARE_PROVIDER_SITE_OTHER): Payer: Medicare Other | Admitting: Family Medicine

## 2011-08-14 VITALS — BP 130/68 | HR 92 | Temp 98.1°F | Wt 154.0 lb

## 2011-08-14 DIAGNOSIS — D649 Anemia, unspecified: Secondary | ICD-10-CM

## 2011-08-14 DIAGNOSIS — R5383 Other fatigue: Secondary | ICD-10-CM

## 2011-08-14 DIAGNOSIS — E538 Deficiency of other specified B group vitamins: Secondary | ICD-10-CM

## 2011-08-14 DIAGNOSIS — R5381 Other malaise: Secondary | ICD-10-CM

## 2011-08-14 DIAGNOSIS — Z5189 Encounter for other specified aftercare: Secondary | ICD-10-CM | POA: Diagnosis not present

## 2011-08-14 DIAGNOSIS — E876 Hypokalemia: Secondary | ICD-10-CM

## 2011-08-14 DIAGNOSIS — R0602 Shortness of breath: Secondary | ICD-10-CM | POA: Diagnosis not present

## 2011-08-14 DIAGNOSIS — I509 Heart failure, unspecified: Secondary | ICD-10-CM | POA: Diagnosis not present

## 2011-08-14 DIAGNOSIS — I959 Hypotension, unspecified: Secondary | ICD-10-CM

## 2011-08-14 DIAGNOSIS — R0609 Other forms of dyspnea: Secondary | ICD-10-CM | POA: Diagnosis not present

## 2011-08-14 NOTE — Patient Instructions (Signed)
We will start by checking blood work today to rule out reversible causes of fatigue and to recheck some things. Good to see you today, call us with questions.

## 2011-08-14 NOTE — Progress Notes (Signed)
  Subjective:    Patient ID: Ashley [name], female    DOB: June 21, 1945, 66 y.o.   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  ICD placed 07/14/2011.  Still feeling stitches occasionally. Last had pacer checked 2 wks ago, told working well. Uses O2 at 2L [place] continually.  Labile bp/pulse: This morning after walking around, bp 125/50, HR 96. At gym today doing pulm rehab, 82/58, HR 127.  Did feel dizzy with this low blood pressure.  HR dropped to 109 with rest and fluid.  Staying fatigued - worse since ICD. Legs ache, toes ache, bilateral fingers ache at joints. Left shoulder/elbow hurting as well since pacer.  Changing around torsemide for optimal fluid management.  On average taking 3/day.  Goal weight is 150lbs.  If drops to 148lb, gets dehydrated.  Denies chest pain/tightness, SOB.  coughing has significantly improved.  Wt Readings from Last 3 Encounters:  08/14/11 154 lb (69.854 kg)  07/31/11 153 lb 12.8 oz (69.763 kg)  07/24/11 150 lb 8 oz (68.266 kg)   Planning cruise to [place] late April.  Has abx on hand for bronchitis in case gets sick on cruise per [facility], knows to only take if has evidence of bacterial infection (aggressively treat URIs given h/o bronchiectasis).  Echocardiogram on May 02 2011 showed moderate pulmonary hypertension, ejection fraction 25%.  Cardiac MRI showed ejection fraction 34% S/p ICD placed 07/14/2011.  Review of Systems Per HPI    Objective:   Physical Exam  Nursing note and vitals reviewed. Constitutional: She appears well-developed and well-nourished. No distress.  HENT:  Head: Normocephalic and atraumatic.  Mouth/Throat: Oropharynx is clear and moist. No oropharyngeal exudate.  Eyes:  Conjunctivae and EOM are normal. Pupils are equal, round, and reactive to light.  Neck: Normal range of motion. Neck supple. Carotid bruit is not present. No thyromegaly present.  Cardiovascular: Normal rate, regular rhythm, normal heart sounds and intact distal pulses.   No murmur heard. Pulmonary/Chest: Effort normal and breath sounds normal. No respiratory distress. She has no wheezes. She has no rales.       Pacer insertion site c/d/i  Lymphadenopathy:    She has no cervical adenopathy.  Skin: Skin is warm and dry. No rash noted.  Psychiatric: She has a normal mood and affect.       Assessment & Plan:

## 2011-08-14 NOTE — Assessment & Plan Note (Signed)
Recently worsening.  Recheck blood work (CBC, BMP, B12 and TSH). I wonder how much attributable to fibromyalgia. Pt did decrease xanax to daily last few weeks, correlates to worsening fatigue.  rec start xanax bid for 3-5 days to see if any improvement in sxs. May need to discuss SSRI for anxiety to see if improvement. Last check somewhat hyperthyroid, plan was to recheck but never returned for f/u labs.

## 2011-08-14 NOTE — Assessment & Plan Note (Signed)
Some lows ie today at gym bp down to 80/50 and HR 120s. Discussed decreasing hydralazine to bid, but will start with checking blood work. If BP staying low and normal blood work, will likely recommend holding mid day hydralazine.

## 2011-08-14 NOTE — Telephone Encounter (Signed)
Message left advising patient to call and schedule appt. Advised to schedule 30 minute appt per Selena Batten.

## 2011-08-14 NOTE — Assessment & Plan Note (Signed)
Today pt denies ever having any dyspnea, states CHF manifested as cough and fatigue.

## 2011-08-14 NOTE — Telephone Encounter (Signed)
Pt is calling back about her Defibulator and needing blood work for being anemic. Pt is saying she had her Defibrillator checked 2 week ago and she really thinks she may be anemic.

## 2011-08-14 NOTE — Assessment & Plan Note (Signed)
Recheck K+ today. °

## 2011-08-15 ENCOUNTER — Other Ambulatory Visit: Payer: Self-pay | Admitting: Family Medicine

## 2011-08-15 ENCOUNTER — Telehealth: Payer: Self-pay | Admitting: *Deleted

## 2011-08-15 DIAGNOSIS — E876 Hypokalemia: Secondary | ICD-10-CM

## 2011-08-15 DIAGNOSIS — D649 Anemia, unspecified: Secondary | ICD-10-CM

## 2011-08-15 LAB — CBC WITH DIFFERENTIAL/PLATELET
Basophils Absolute: 0.1 10*3/uL (ref 0.0–0.1)
Basophils Relative: 0.9 % (ref 0.0–3.0)
Eosinophils Absolute: 0.1 10*3/uL (ref 0.0–0.7)
Eosinophils Relative: 1.4 % (ref 0.0–5.0)
HCT: 32.2 % — ABNORMAL LOW (ref 36.0–46.0)
Hemoglobin: 11 g/dL — ABNORMAL LOW (ref 12.0–15.0)
Lymphocytes Relative: 23.4 % (ref 12.0–46.0)
Lymphs Abs: 2.2 10*3/uL (ref 0.7–4.0)
MCHC: 34.1 g/dL (ref 30.0–36.0)
MCV: 87.7 fl (ref 78.0–100.0)
Monocytes Absolute: 0.8 10*3/uL (ref 0.1–1.0)
Monocytes Relative: 8.8 % (ref 3.0–12.0)
Neutro Abs: 6.2 10*3/uL (ref 1.4–7.7)
Neutrophils Relative %: 65.5 % (ref 43.0–77.0)
Platelets: 213 10*3/uL (ref 150.0–400.0)
RBC: 3.67 Mil/uL — ABNORMAL LOW (ref 3.87–5.11)
RDW: 14.2 % (ref 11.5–14.6)
WBC: 9.4 10*3/uL (ref 4.5–10.5)

## 2011-08-15 LAB — BASIC METABOLIC PANEL
BUN: 39 mg/dL — ABNORMAL HIGH (ref 6–23)
CO2: 31 mEq/L (ref 19–32)
Calcium: 10.6 mg/dL — ABNORMAL HIGH (ref 8.4–10.5)
Chloride: 94 mEq/L — ABNORMAL LOW (ref 96–112)
Creatinine, Ser: 1.2 mg/dL (ref 0.4–1.2)
GFR: 48.7 mL/min — ABNORMAL LOW (ref >60.00)
Glucose, Bld: 96 mg/dL (ref 70–99)
Potassium: 2.8 mEq/L — CL (ref 3.5–5.1)
Sodium: 138 mEq/L (ref 135–145)

## 2011-08-15 LAB — VITAMIN B12: Vitamin B-12: 321 pg/mL (ref 211–911)

## 2011-08-15 LAB — TSH: TSH: 0.86 u[IU]/mL (ref 0.35–5.50)

## 2011-08-15 LAB — FERRITIN: Ferritin: 67.8 ng/mL (ref 10.0–291.0)

## 2011-08-15 MED ORDER — VITAMIN B-12 500 MCG PO TABS: 500.0000 ug | ORAL_TABLET | Freq: Every day | ORAL | Status: AC

## 2011-08-15 MED ORDER — POTASSIUM CHLORIDE ER 10 MEQ PO TBCR: 20.0000 meq | EXTENDED_RELEASE_TABLET | Freq: Two times a day (BID) | ORAL | Status: DC

## 2011-08-15 NOTE — Telephone Encounter (Signed)
PT AWARE,  DID  HAVE LABS DONE YESTERDAY  AT DR Sharen Hones  OFFICE PER PT .Ashley [name]

## 2011-08-15 NOTE — Telephone Encounter (Signed)
Noted. See lab notes.  

## 2011-08-15 NOTE — Telephone Encounter (Signed)
Critical labs potassium 2.8

## 2011-08-17 DIAGNOSIS — M999 Biomechanical lesion, unspecified: Secondary | ICD-10-CM | POA: Diagnosis not present

## 2011-08-17 DIAGNOSIS — M9981 Other biomechanical lesions of cervical region: Secondary | ICD-10-CM | POA: Diagnosis not present

## 2011-08-17 DIAGNOSIS — M5030 Other cervical disc degeneration, unspecified cervical region: Secondary | ICD-10-CM | POA: Diagnosis not present

## 2011-08-17 DIAGNOSIS — M5137 Other intervertebral disc degeneration, lumbosacral region: Secondary | ICD-10-CM | POA: Diagnosis not present

## 2011-08-22 ENCOUNTER — Other Ambulatory Visit (INDEPENDENT_AMBULATORY_CARE_PROVIDER_SITE_OTHER): Payer: Medicare Other

## 2011-08-22 DIAGNOSIS — E876 Hypokalemia: Secondary | ICD-10-CM | POA: Diagnosis not present

## 2011-08-22 DIAGNOSIS — D649 Anemia, unspecified: Secondary | ICD-10-CM | POA: Diagnosis not present

## 2011-08-22 LAB — BASIC METABOLIC PANEL
BUN: 34 mg/dL — ABNORMAL HIGH (ref 6–23)
CO2: 30 mEq/L (ref 19–32)
Calcium: 10.2 mg/dL (ref 8.4–10.5)
Chloride: 96 mEq/L (ref 96–112)
Creatinine, Ser: 1.2 mg/dL (ref 0.4–1.2)
GFR: 47.76 mL/min — ABNORMAL LOW (ref >60.00)
Glucose, Bld: 123 mg/dL — ABNORMAL HIGH (ref 70–99)
Potassium: 3.2 mEq/L — ABNORMAL LOW (ref 3.5–5.1)
Sodium: 141 mEq/L (ref 135–145)

## 2011-08-22 LAB — IBC PANEL
Iron: 97 ug/dL (ref 42–145)
Saturation Ratios: 20.2 % (ref 20.0–50.0)
Transferrin: 343.7 mg/dL (ref 212.0–360.0)

## 2011-08-23 ENCOUNTER — Other Ambulatory Visit: Payer: Self-pay | Admitting: Family Medicine

## 2011-08-23 DIAGNOSIS — D649 Anemia, unspecified: Secondary | ICD-10-CM

## 2011-08-23 DIAGNOSIS — E876 Hypokalemia: Secondary | ICD-10-CM

## 2011-08-24 DIAGNOSIS — IMO0002 Reserved for concepts with insufficient information to code with codable children: Secondary | ICD-10-CM | POA: Diagnosis not present

## 2011-08-24 DIAGNOSIS — M999 Biomechanical lesion, unspecified: Secondary | ICD-10-CM | POA: Diagnosis not present

## 2011-08-24 DIAGNOSIS — M5137 Other intervertebral disc degeneration, lumbosacral region: Secondary | ICD-10-CM | POA: Diagnosis not present

## 2011-08-25 ENCOUNTER — Telehealth: Payer: Self-pay | Admitting: Family Medicine

## 2011-08-25 ENCOUNTER — Encounter: Payer: Self-pay | Admitting: Family Medicine

## 2011-08-25 MED ORDER — HYDRALAZINE HCL 25 MG PO TABS: 12.5000 mg | ORAL_TABLET | Freq: Two times a day (BID) | ORAL | Status: DC

## 2011-08-25 NOTE — Telephone Encounter (Signed)
See recent lab result notes. Any specific time of day where has low blood pressures? I would recommend holding mid day dose of hydralazine.  Changed to bid in chart.

## 2011-08-25 NOTE — Telephone Encounter (Signed)
Patient advised.

## 2011-09-11 ENCOUNTER — Other Ambulatory Visit: Payer: Self-pay | Admitting: *Deleted

## 2011-09-11 ENCOUNTER — Encounter: Payer: Self-pay | Admitting: Internal Medicine

## 2011-09-11 ENCOUNTER — Ambulatory Visit (INDEPENDENT_AMBULATORY_CARE_PROVIDER_SITE_OTHER): Payer: Medicare Other | Admitting: *Deleted

## 2011-09-11 DIAGNOSIS — I5023 Acute on chronic systolic (congestive) heart failure: Secondary | ICD-10-CM | POA: Diagnosis not present

## 2011-09-11 DIAGNOSIS — IMO0002 Reserved for concepts with insufficient information to code with codable children: Secondary | ICD-10-CM | POA: Diagnosis not present

## 2011-09-11 DIAGNOSIS — I428 Other cardiomyopathies: Secondary | ICD-10-CM | POA: Diagnosis not present

## 2011-09-11 DIAGNOSIS — M5137 Other intervertebral disc degeneration, lumbosacral region: Secondary | ICD-10-CM | POA: Diagnosis not present

## 2011-09-11 DIAGNOSIS — M999 Biomechanical lesion, unspecified: Secondary | ICD-10-CM | POA: Diagnosis not present

## 2011-09-11 LAB — ICD DEVICE OBSERVATION
BATTERY VOLTAGE: 3.2114 V
BRDY-0002RV: 40 {beats}/min
CHARGE TIME: 8.918 s
DEV-0020ICD: NEGATIVE
FVT: 0
PACEART VT: 0
RV LEAD AMPLITUDE: 12.75 mv
RV LEAD IMPEDENCE ICD: 950 Ohm
RV LEAD THRESHOLD: 0.625 V
TOT-0001: 0
TOT-0002: 0
TOT-0006: 20130301000000
TZAT-0001FASTVT: 1
TZAT-0001SLOWVT: 1
TZAT-0002FASTVT: NEGATIVE
TZAT-0002SLOWVT: NEGATIVE
TZAT-0012FASTVT: 170 ms
TZAT-0012SLOWVT: 170 ms
TZAT-0018FASTVT: NEGATIVE
TZAT-0018SLOWVT: NEGATIVE
TZAT-0019FASTVT: 8 V
TZAT-0019SLOWVT: 8 V
TZAT-0020FASTVT: 1.5 ms
TZAT-0020SLOWVT: 1.5 ms
TZON-0003SLOWVT: 360 ms
TZON-0003VSLOWVT: 450 ms
TZON-0004SLOWVT: 16
TZON-0004VSLOWVT: 20
TZON-0005SLOWVT: 12
TZST-0001FASTVT: 2
TZST-0001FASTVT: 3
TZST-0001FASTVT: 4
TZST-0001FASTVT: 5
TZST-0001FASTVT: 6
TZST-0001SLOWVT: 2
TZST-0001SLOWVT: 3
TZST-0001SLOWVT: 4
TZST-0001SLOWVT: 5
TZST-0001SLOWVT: 6
TZST-0002FASTVT: NEGATIVE
TZST-0002FASTVT: NEGATIVE
TZST-0002FASTVT: NEGATIVE
TZST-0002FASTVT: NEGATIVE
TZST-0002FASTVT: NEGATIVE
TZST-0002SLOWVT: NEGATIVE
TZST-0002SLOWVT: NEGATIVE
TZST-0002SLOWVT: NEGATIVE
TZST-0002SLOWVT: NEGATIVE
TZST-0002SLOWVT: NEGATIVE
VENTRICULAR PACING ICD: 0.01 pct
VF: 0

## 2011-09-11 MED ORDER — ALPRAZOLAM 0.25 MG PO TABS: 0.2500 mg | ORAL_TABLET | Freq: Two times a day (BID) | ORAL | Status: DC | PRN

## 2011-09-11 NOTE — Telephone Encounter (Signed)
OK to refill

## 2011-09-11 NOTE — Telephone Encounter (Signed)
plz phone in. 

## 2011-09-11 NOTE — Telephone Encounter (Signed)
Rx called in as directed.   

## 2011-09-11 NOTE — Progress Notes (Signed)
icd check in clinic  

## 2011-09-13 ENCOUNTER — Encounter: Payer: Self-pay | Admitting: Pulmonary Disease

## 2011-09-13 ENCOUNTER — Telehealth: Payer: Self-pay | Admitting: Cardiology

## 2011-09-13 DIAGNOSIS — I509 Heart failure, unspecified: Secondary | ICD-10-CM | POA: Diagnosis not present

## 2011-09-13 DIAGNOSIS — Z5189 Encounter for other specified aftercare: Secondary | ICD-10-CM | POA: Diagnosis not present

## 2011-09-13 DIAGNOSIS — R0609 Other forms of dyspnea: Secondary | ICD-10-CM | POA: Diagnosis not present

## 2011-09-13 NOTE — Telephone Encounter (Signed)
Returned page from Ms. Helfman. She did not answer the phone. I left a message.  Leonetta Mcgivern PA-C 09/13/2011 5:45 PM

## 2011-09-13 NOTE — Telephone Encounter (Signed)
Returned a page from Ms. Zavadil. She reports being constipated yesterday and now has had a couple of loose stools. Also is hurting in all her joints. Reports stinging sensation at her defibrillator site when she moves her left shoulder. She stated she just didn't feel good and didn't know what to do. She was not having active chest pain or sob. Her PCP office is closed so I recommended she go to an urgent care facility for further evaluation as I can not fully assess her complaints over the phone. She stated understanding.   Elias Dennington PA-C 09/13/2011 6:07 PM

## 2011-09-14 ENCOUNTER — Encounter: Payer: Self-pay | Admitting: Family Medicine

## 2011-09-14 ENCOUNTER — Ambulatory Visit (INDEPENDENT_AMBULATORY_CARE_PROVIDER_SITE_OTHER): Payer: Medicare Other | Admitting: Family Medicine

## 2011-09-14 VITALS — BP 112/70 | HR 106 | Temp 97.9°F | Wt 149.2 lb

## 2011-09-14 DIAGNOSIS — E876 Hypokalemia: Secondary | ICD-10-CM

## 2011-09-14 DIAGNOSIS — M79609 Pain in unspecified limb: Secondary | ICD-10-CM | POA: Diagnosis not present

## 2011-09-14 DIAGNOSIS — M79602 Pain in left arm: Secondary | ICD-10-CM

## 2011-09-14 DIAGNOSIS — D649 Anemia, unspecified: Secondary | ICD-10-CM

## 2011-09-14 DIAGNOSIS — Z136 Encounter for screening for cardiovascular disorders: Secondary | ICD-10-CM | POA: Diagnosis not present

## 2011-09-14 DIAGNOSIS — R5381 Other malaise: Secondary | ICD-10-CM

## 2011-09-14 LAB — COMPREHENSIVE METABOLIC PANEL
ALT: 22 U/L (ref 0–35)
AST: 23 U/L (ref 0–37)
Albumin: 5.1 g/dL (ref 3.5–5.2)
Alkaline Phosphatase: 121 U/L — ABNORMAL HIGH (ref 39–117)
BUN: 38 mg/dL — ABNORMAL HIGH (ref 6–23)
CO2: 32 mEq/L (ref 19–32)
Calcium: 10.8 mg/dL — ABNORMAL HIGH (ref 8.4–10.5)
Chloride: 96 mEq/L (ref 96–112)
Creat: 1.54 mg/dL — ABNORMAL HIGH (ref 0.50–1.10)
Glucose, Bld: 112 mg/dL — ABNORMAL HIGH (ref 70–99)
Potassium: 3.6 mEq/L (ref 3.5–5.3)
Sodium: 141 mEq/L (ref 135–145)
Total Bilirubin: 0.4 mg/dL (ref 0.3–1.2)
Total Protein: 8.3 g/dL (ref 6.0–8.3)

## 2011-09-14 LAB — CK TOTAL AND CKMB (NOT AT ARMC)
CK, MB: 1.3 ng/mL (ref 0.3–4.0)
Relative Index: 1.1 (ref 0.0–2.5)
Total CK: 115 U/L (ref 7–177)

## 2011-09-14 LAB — TROPONIN I: Troponin I: <0.01 ng/mL (ref <0.06)

## 2011-09-14 LAB — CBC WITH DIFFERENTIAL/PLATELET
Basophils Absolute: 0 10*3/uL (ref 0.0–0.1)
Basophils Relative: 0 % (ref 0–1)
Eosinophils Absolute: 0.1 10*3/uL (ref 0.0–0.7)
Eosinophils Relative: 1 % (ref 0–5)
HCT: 37.2 % (ref 36.0–46.0)
Hemoglobin: 12.6 g/dL (ref 12.0–15.0)
Lymphocytes Relative: 21 % (ref 12–46)
Lymphs Abs: 2.7 10*3/uL (ref 0.7–4.0)
MCH: 30.3 pg (ref 26.0–34.0)
MCHC: 33.9 g/dL (ref 30.0–36.0)
MCV: 89.4 fL (ref 78.0–100.0)
Monocytes Absolute: 1.3 10*3/uL — ABNORMAL HIGH (ref 0.1–1.0)
Monocytes Relative: 10 % (ref 3–12)
Neutro Abs: 8.8 10*3/uL — ABNORMAL HIGH (ref 1.7–7.7)
Neutrophils Relative %: 68 % (ref 43–77)
Platelets: 227 10*3/uL (ref 150–400)
RBC: 4.16 MIL/uL (ref 3.87–5.11)
RDW: 12.6 % (ref 11.5–15.5)
WBC: 12.9 10*3/uL — ABNORMAL HIGH (ref 4.0–10.5)

## 2011-09-14 NOTE — Patient Instructions (Addendum)
i'm sorry you're not feeling well. Blood work today STAT - we will call you with results. Almost sounds like you had viral infection, but I want to rule out electrolyte and heart issue. Good to see you today, call us with quesitons.

## 2011-09-14 NOTE — Assessment & Plan Note (Addendum)
Left arm soreness started after handle of O2 tank fell onto pacer site, then radiation and localization of pain to L shoulder. Positional, reproducible.  ?bursitis - treat with aleve and ice regularly for next several days.  If not better, obtain xray of shoulder. Doubt cardiac etiology but given hx, checked stat cardiac enzymes, EKG. EKG - NSR, rate 91, normal axis, intervals, no acute ST/T changes, no change from prior tracing 07/2011. I wonder how much anxiety vs fibromyalgia playing part - as one moment pt is tearful from pain and discomfort, and next moment very engaged describing recent life events.  Even pt wonders if anxiety causing some of these sxs.

## 2011-09-14 NOTE — Assessment & Plan Note (Addendum)
With generalized weakness and myriad of other sxs. Unclear etiology. Check lytes as h/o hypokalemia and hypercalcemia (?bone pain).  Check CBC as h/o anemia. ?viral syndrome however no sick contacts, no residual cough or abd pain or URI sxs.

## 2011-09-14 NOTE — Progress Notes (Signed)
Subjective:    Patient ID: Ashley [name], female    DOB: 09-27-1945, 66 y.o.   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  1 d h/o body aches, pacer site stinging, diarrhea alternating with constipation last night.  Couldn't stand up or walk last night 2/2 generalized weakness and body aches.  No localizing pain.  Some nausea.  No palpitations, chest pain, SOB, coughing.  No fevers/chills.  No bleeding noted.  No blood in stool.  No dysuria, urgency, frequency.    Did endorse left arm pain that started at pacer site and extended to mid deltoid laterally.  Very positional - worse with lifting arm - very reproducible with palpation as well.  bp yesterday when not feeling well was 116/98, HR 111.  Currently no BM changes.  Traveled to [place] yesterday to visit grandchildren, but pt stayed on couch all day long 2/2 fatigue. Returned from cruise last Thursday.  Had wonderful time, shares several stories of her experience. Decreased appetite over last 2 days, decreased eating.  One coughing fit that lasted 1 hr when on cruise, no cough since.  During cruise, did have episode where oxygen tank handle fell on chest at pacer, site sore since then, some stinging tenderness.  Monday had pacer checked - told working normal and no arrhythmias captured.  Wt Readings from Last 3 Encounters:  09/14/11 149 lb 4 oz (67.699 kg)  08/14/11 154 lb (69.854 kg)  07/31/11 153 lb 12.8 oz (69.763 kg)  weight loss noted.  goal weight is approx 150lb  No sick contacts that pt knows of.   Past Medical History  Diagnosis Date  . Migraine without aura, without mention of intractable migraine without mention of status migrainosus   . Diarrhea   .  Diverticulosis of colon     no diverticulitis  . Pure hypercholesterolemia   . Irritable bowel syndrome   . Laryngeal nodule     Dr. Thelma Comp  . Fibrocystic breast disease   . Osteoarthritis   . Hemorrhoids   . Anxiety   . GERD (gastroesophageal reflux disease)   . Systolic CHF     EF 25-35%, LV dilation, significant MR  . Nonischemic cardiomyopathy   . Pneumonia 2012  . Mitral regurgitation   . Anxiety   . Pleural effusion, bilateral 2012  . Bronchiectasis     mild, treat URIs aggressively  . Hypertension   . Heart murmur   . CHF (congestive heart failure)   . Angina   . Lung disease 07/14/11    "my lungs are loaded w/scar tissue and my lung age is 50"  . Sleep apnea   . Shortness of breath     "resting"  . Anemia   . Fibromyalgia   . Depression      Review of Systems Per HPI    Objective:   Physical Exam  Nursing note and vitals reviewed. Constitutional: She appears well-developed and well-nourished. No distress.  HENT:  Head: Normocephalic and atraumatic.  Mouth/Throat: Oropharynx is clear and moist. No oropharyngeal exudate.  Eyes: Conjunctivae and EOM are normal. Pupils are equal, round, and reactive to light. No scleral icterus.  Neck: Normal range of motion. Neck supple.  Cardiovascular: Regular rhythm, normal heart sounds and intact distal  pulses.  Tachycardia present.   No murmur heard.   Pulmonary/Chest: Effort normal and breath sounds normal. No respiratory distress. She has no decreased breath sounds. She has no wheezes. She has no rhonchi. She has no rales.  Abdominal: Soft. Bowel sounds are normal. She exhibits no distension and no mass. There is tenderness (mild diffuse). There is no rebound and no guarding.  Musculoskeletal: She exhibits no edema.       Left anterior shoulder tender to palpation, AC bursa tender as well. Unable to fully abduct at shoulder 2/2 pain. Left upper arm tender to palpation laterally  Lymphadenopathy:    She has no  cervical adenopathy.  Neurological:       Tremor present  Skin: Skin is warm and dry. No rash noted.  Psychiatric: She has a normal mood and affect.   \\ On way out of office - had episode where felt weak and shakey, had to sit down, tremor noted.  Vitals stable - 104/60, HR 100.  EKG obtained - NSR at rate of 91, no acute changes.  Came back into room and laid down, felt better after several minutes, then went home.     Assessment & Plan:

## 2011-09-21 DIAGNOSIS — M999 Biomechanical lesion, unspecified: Secondary | ICD-10-CM | POA: Diagnosis not present

## 2011-09-21 DIAGNOSIS — M5137 Other intervertebral disc degeneration, lumbosacral region: Secondary | ICD-10-CM | POA: Diagnosis not present

## 2011-09-21 DIAGNOSIS — IMO0002 Reserved for concepts with insufficient information to code with codable children: Secondary | ICD-10-CM | POA: Diagnosis not present

## 2011-09-24 ENCOUNTER — Other Ambulatory Visit: Payer: Self-pay | Admitting: Family Medicine

## 2011-09-28 DIAGNOSIS — M999 Biomechanical lesion, unspecified: Secondary | ICD-10-CM | POA: Diagnosis not present

## 2011-09-28 DIAGNOSIS — M5137 Other intervertebral disc degeneration, lumbosacral region: Secondary | ICD-10-CM | POA: Diagnosis not present

## 2011-09-28 DIAGNOSIS — IMO0002 Reserved for concepts with insufficient information to code with codable children: Secondary | ICD-10-CM | POA: Diagnosis not present

## 2011-10-02 ENCOUNTER — Ambulatory Visit (INDEPENDENT_AMBULATORY_CARE_PROVIDER_SITE_OTHER): Payer: Medicare Other | Admitting: Family Medicine

## 2011-10-02 ENCOUNTER — Encounter: Payer: Self-pay | Admitting: Family Medicine

## 2011-10-02 VITALS — BP 118/76 | HR 74 | Temp 98.4°F | Wt 153.8 lb

## 2011-10-02 DIAGNOSIS — R109 Unspecified abdominal pain: Secondary | ICD-10-CM

## 2011-10-02 DIAGNOSIS — E876 Hypokalemia: Secondary | ICD-10-CM | POA: Diagnosis not present

## 2011-10-02 DIAGNOSIS — I5023 Acute on chronic systolic (congestive) heart failure: Secondary | ICD-10-CM | POA: Diagnosis not present

## 2011-10-02 NOTE — Patient Instructions (Signed)
Blood work today.  we will call you with results. Good to see you today, call us with questions.

## 2011-10-02 NOTE — Assessment & Plan Note (Signed)
H/o this, recheck today.

## 2011-10-02 NOTE — Assessment & Plan Note (Signed)
Doubt diverticulitis as no fever, improving off abx. Anticipate IBS flare diarrhea predominant vs viral gastro - rec watch for now as improving. Encouraged fluid to ensure hydration but tenuous balance given h/o systolic CHF and goal dry weight of 150lbs

## 2011-10-02 NOTE — Progress Notes (Signed)
Subjective:    Patient ID: Ashley [name], female    DOB: [birth year], 66 y.o.   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  Last blood work found to have ARF with Cr to 1.5.  Potassium was stable.  Veins enlarging in arms and legs and wrists.  No chest pain, tightness, coughing.  No shortness of breath other than baseline.  States taking torsemide 2 tablets in am and 1 at lunch.  Takes potassium 20 mEq daily.  Takes zaroxolyn QOD.  Over weekend worsening middle abdominal cramps as well as watery diarrhea.  Wonders if had some sort of virus.  Nauseated as well.  No fevers/chills, bloating or gassiness.  Did have some leg cramping recently.  abd issues resolving since weekend.  H/o diverticulosis and irritable bowel syndrome.  BM changes similar to prior IBS flares.  abd cramping somewhat different. Lab Results  Component Value Date   CREATININE 1.54* 09/14/2011     BP Readings from Last 3 Encounters:  10/02/11 118/76  09/14/11 112/70  08/14/11 130/68    Wt Readings from Last 3 Encounters:  10/02/11 153 lb 12 oz (69.741 kg)  09/14/11 149 lb 4 oz (67.699 kg)  08/14/11 154 lb (69.854 kg)  Goal dry weight is 150lbs.  Past Medical History  Diagnosis Date  . Migraine without aura, without mention of intractable migraine without mention of status migrainosus   . Diarrhea   . Diverticulosis of colon     no diverticulitis  . Pure hypercholesterolemia   . Irritable bowel syndrome   . Laryngeal nodule     Dr. Thelma Comp  . Fibrocystic breast disease   . Osteoarthritis   . Hemorrhoids   . Anxiety   . GERD (gastroesophageal reflux disease)   . Systolic CHF     EF 25-35%, LV dilation, significant MR, goal weight 150lbs    . Nonischemic cardiomyopathy   . Pneumonia 2012  . Mitral regurgitation   . Anxiety   . Pleural effusion, bilateral 2012  . Bronchiectasis     mild, treat URIs aggressively  . Hypertension   . Heart murmur   . Angina   . Lung disease 07/14/11    "my lungs are loaded w/scar tissue and my lung age is 41"  . Sleep apnea   . Shortness of breath     "resting"  . Anemia   . Fibromyalgia   . Depression     Review of Systems Per HPI    Objective:   Physical Exam  Nursing note and vitals reviewed. Constitutional: She appears well-developed and well-nourished. No distress.  HENT:  Head: Normocephalic and atraumatic.  Mouth/Throat: Oropharynx is clear and moist. No oropharyngeal exudate.  Eyes: EOM are normal. Pupils are equal, round, and reactive to light. No scleral icterus.  Neck: Normal range of motion. Neck supple. No JVD present. Carotid bruit is not present.  Cardiovascular: Normal rate, regular rhythm, normal heart sounds and intact distal pulses.   No murmur heard. Pulmonary/Chest: Effort normal and breath sounds normal. No respiratory distress. She has no wheezes. She has no rales.  Abdominal: Soft. Bowel sounds are normal. She exhibits no distension and no mass. There is no tenderness. There is no rebound and no guarding.  Musculoskeletal: She exhibits no edema.  Marked veins  Lymphadenopathy:    She has no cervical adenopathy.  Skin: Skin is warm and dry. Ecchymosis noted. No rash noted.  Psychiatric: She has a normal mood and affect.       Assessment & Plan:

## 2011-10-02 NOTE — Assessment & Plan Note (Addendum)
currently on torsemide 20mg  in am and 10mg  in pm as well as zaroxolyn qod.  Recheck creatinine today given h/o ARF last check. No evidence of acute failure currently.

## 2011-10-03 LAB — BASIC METABOLIC PANEL
BUN: 40 mg/dL — ABNORMAL HIGH (ref 6–23)
CO2: 31 mEq/L (ref 19–32)
Calcium: 10.3 mg/dL (ref 8.4–10.5)
Chloride: 95 mEq/L — ABNORMAL LOW (ref 96–112)
Creatinine, Ser: 1.3 mg/dL — ABNORMAL HIGH (ref 0.4–1.2)
GFR: 43.15 mL/min — ABNORMAL LOW (ref >60.00)
Glucose, Bld: 83 mg/dL (ref 70–99)
Potassium: 3.2 mEq/L — ABNORMAL LOW (ref 3.5–5.1)
Sodium: 141 mEq/L (ref 135–145)

## 2011-10-04 ENCOUNTER — Other Ambulatory Visit: Payer: Self-pay | Admitting: Family Medicine

## 2011-10-04 DIAGNOSIS — M999 Biomechanical lesion, unspecified: Secondary | ICD-10-CM | POA: Diagnosis not present

## 2011-10-04 DIAGNOSIS — IMO0002 Reserved for concepts with insufficient information to code with codable children: Secondary | ICD-10-CM | POA: Diagnosis not present

## 2011-10-04 DIAGNOSIS — M5137 Other intervertebral disc degeneration, lumbosacral region: Secondary | ICD-10-CM | POA: Diagnosis not present

## 2011-10-04 MED ORDER — POTASSIUM CHLORIDE ER 10 MEQ PO TBCR: 30.0000 meq | EXTENDED_RELEASE_TABLET | Freq: Every day | ORAL | Status: DC

## 2011-10-06 ENCOUNTER — Telehealth: Payer: Self-pay

## 2011-10-06 MED ORDER — ONDANSETRON HCL 4 MG PO TABS: 4.0000 mg | ORAL_TABLET | Freq: Three times a day (TID) | ORAL | Status: AC | PRN

## 2011-10-06 NOTE — Telephone Encounter (Signed)
Pt left v/m for lab results. I called pt she did not get message from Kim re:lab results on 10/04/11 due to pt having problem with phones. Patient notified as instructed by telephone. Pt said she got nauseated 5 mins ago and just vomited large amt. Pt had not vomited in 2-3 weeks prior to this episode.After vomiting pt has h/a/ pain level now 2. No fever, no other pain.pt can be reached at [phone] if no answer leave detailed message.[employer].

## 2011-10-06 NOTE — Telephone Encounter (Signed)
I would just have her continue treatment plan as outlined by result note for recent BMP regarding torsemide and potassium dosing. Ensure taking small sips throughout the day. Discussed this with pt.

## 2011-10-06 NOTE — Telephone Encounter (Signed)
Sent in some zofran for prn use

## 2011-10-10 ENCOUNTER — Encounter: Payer: Self-pay | Admitting: Family Medicine

## 2011-10-10 ENCOUNTER — Ambulatory Visit (INDEPENDENT_AMBULATORY_CARE_PROVIDER_SITE_OTHER): Payer: Medicare Other | Admitting: Family Medicine

## 2011-10-10 ENCOUNTER — Telehealth: Payer: Self-pay | Admitting: Pulmonary Disease

## 2011-10-10 VITALS — BP 136/74 | HR 96 | Temp 98.1°F | Wt 151.8 lb

## 2011-10-10 DIAGNOSIS — R040 Epistaxis: Secondary | ICD-10-CM

## 2011-10-10 DIAGNOSIS — I959 Hypotension, unspecified: Secondary | ICD-10-CM

## 2011-10-10 DIAGNOSIS — I5023 Acute on chronic systolic (congestive) heart failure: Secondary | ICD-10-CM | POA: Diagnosis not present

## 2011-10-10 NOTE — Progress Notes (Signed)
  Subjective:    Patient ID: Ashley [name], female    DOB: 1945-09-29, 66 y.o.   MRN: [id number]  HPI CC: nosebleeds with scabbing as well as joint pains and generalized achiness  Nosebleeds - longstanding even prior to oxygen.  However worsened on oxygen.  Bleeding not prolonged, resolves quickly but bothersome to pt.  Bleeding turns into scabs.  Using ayr ointment and spray.  Wonders if could go onto pulse oxygen instead of continuous by nasal cannula.  Tried humidifier for oxygen but woke up with face soaked so did not retry.  When in car able to drop oxygen to 1.5L/min but then gets into coughing fits so has not kept at 1.5L.  BP - continues to have low bp, especially at cardiac rehab.  As low as 80/40s, often times necessitating postponement of rehab.  But doesn't feel presyncopal or lightheaded with these numbers.  Also staying fatigued throughout the day.  Feels frustrated because not noticing significant improvement, although does know that this process will be slow moving.  Past Medical History  Diagnosis Date  . Migraine without aura, without mention of intractable migraine without mention of status migrainosus   . Diarrhea   . Diverticulosis of colon     no diverticulitis  . Pure hypercholesterolemia   . Irritable bowel syndrome   . Laryngeal nodule     Dr. Thelma Comp  . Fibrocystic breast disease   . Osteoarthritis   . Hemorrhoids   . Anxiety   . GERD (gastroesophageal reflux disease)   . Systolic CHF     EF 25-35%, LV dilation, significant MR, goal weight 150lbs  . Nonischemic cardiomyopathy   . Pneumonia 2012  . Mitral regurgitation   . Anxiety   . Pleural effusion, bilateral 2012  . Bronchiectasis     mild, treat URIs aggressively  . Hypertension   . Heart murmur   . Angina   . Lung disease 07/14/11  . Sleep apnea   . Shortness of breath     "resting"  . Anemia   . Fibromyalgia   . Depression     Review of Systems Per HPI    Objective:   Physical Exam    Nursing note and vitals reviewed. Constitutional: She appears well-developed and well-nourished. No distress.  HENT:  Head: Normocephalic and atraumatic.  Nose: No mucosal edema or rhinorrhea.  Mouth/Throat: Oropharynx is clear and moist. No oropharyngeal exudate.       Crusting evident left inferior outer nare Bilaterally irritated nasal epithelium but no obvious bleed  Eyes: Conjunctivae and EOM are normal. Pupils are equal, round, and reactive to light. No scleral icterus.  Neck: Normal range of motion. Neck supple. No JVD present. Carotid bruit is not present.  Cardiovascular: Normal rate, regular rhythm, normal heart sounds and intact distal pulses.   No murmur heard. Pulmonary/Chest: Effort normal and breath sounds normal. No respiratory distress. She has no wheezes. She has no rales.  Musculoskeletal: She exhibits no edema.  Lymphadenopathy:    She has no cervical adenopathy.  Skin: Skin is warm and dry. No rash noted.  Psychiatric: She has a normal mood and affect.       Assessment & Plan:

## 2011-10-10 NOTE — Telephone Encounter (Signed)
Spoke with pt. She states that she has been having epistaxis here recently and has painful scabs in her nose. She states that she is seeing Dr. Reece Agar this pm for this. I advised in the meantime can try ayr saline nasal gel to moisten nostrils.   Pt states that she would like Dr Kendrick Fries to send order to [employer] for a new o2 system. She states "the one with two batteries and weighs 8 lbs". Dr Kendrick Fries is this okay? Pt is aware that you are out of the office this wk and is okay with waiting until next wk for response. Please advise, thanks

## 2011-10-10 NOTE — Telephone Encounter (Signed)
Pt is returning call she was in the shower

## 2011-10-10 NOTE — Patient Instructions (Addendum)
Decrease hydralazine to 1/2 pill twice daily instead of 3 times daily. Decrease oxygen to pulse therapy if able. I think this will significantly help nosebleeds. I also want you to try humidifier on oxygen again. Continue Ayr products. Good to see you today, call us with questions. Check with Dr. Kendrick Fries if this is all ok.

## 2011-10-10 NOTE — Telephone Encounter (Signed)
LMTCB x 1 

## 2011-10-11 DIAGNOSIS — R040 Epistaxis: Secondary | ICD-10-CM | POA: Insufficient documentation

## 2011-10-11 NOTE — Assessment & Plan Note (Signed)
No evidence of active CHF exacerbation. Again reviewed torsemide and potassium dosing - take one potassium pill for every torsemide pill she takes. Currently on torsemide 20mg  in am and 10mg  in pm as well as zaroxolyn qod.

## 2011-10-11 NOTE — Assessment & Plan Note (Addendum)
Continued hypotension especially at rehab - decrease hydralazine to 12.5mg  bid (from tid) and reassess sxs.

## 2011-10-11 NOTE — Assessment & Plan Note (Addendum)
Mild but bothersome to patient.  Anticipate fully due to continuous oxygen therapy at 2L [place]. Already on ayr and uses humidifier in room. Cannot use petroleum based products 2/2 flammability hazard. Recommended change to pulse oxygen by [place] instead of continuous, start while resting - trial of pulse ox in office and pt tolerated very well, O2 sat maintained at 98-99%, no evidence of shortness of breath. Also recommended re-trial of humidifier attached to O2 tank.

## 2011-10-12 ENCOUNTER — Other Ambulatory Visit: Payer: Self-pay | Admitting: *Deleted

## 2011-10-12 DIAGNOSIS — M5137 Other intervertebral disc degeneration, lumbosacral region: Secondary | ICD-10-CM | POA: Diagnosis not present

## 2011-10-12 DIAGNOSIS — M999 Biomechanical lesion, unspecified: Secondary | ICD-10-CM | POA: Diagnosis not present

## 2011-10-12 DIAGNOSIS — IMO0002 Reserved for concepts with insufficient information to code with codable children: Secondary | ICD-10-CM | POA: Diagnosis not present

## 2011-10-12 MED ORDER — ALPRAZOLAM 0.25 MG PO TABS: 0.2500 mg | ORAL_TABLET | Freq: Two times a day (BID) | ORAL | Status: DC | PRN

## 2011-10-12 NOTE — Telephone Encounter (Signed)
LMTCB

## 2011-10-12 NOTE — Telephone Encounter (Signed)
Rx called in as directed.   

## 2011-10-12 NOTE — Telephone Encounter (Signed)
OK to refill

## 2011-10-12 NOTE — Telephone Encounter (Signed)
June 13 would be a good day to work her in if she is OK with it.

## 2011-10-13 NOTE — Telephone Encounter (Signed)
LMTCB x2  

## 2011-10-14 ENCOUNTER — Encounter: Payer: Self-pay | Admitting: Pulmonary Disease

## 2011-10-14 DIAGNOSIS — Z5189 Encounter for other specified aftercare: Secondary | ICD-10-CM | POA: Diagnosis not present

## 2011-10-14 DIAGNOSIS — I509 Heart failure, unspecified: Secondary | ICD-10-CM | POA: Diagnosis not present

## 2011-10-16 DIAGNOSIS — H5710 Ocular pain, unspecified eye: Secondary | ICD-10-CM | POA: Diagnosis not present

## 2011-10-16 DIAGNOSIS — H02059 Trichiasis without entropian unspecified eye, unspecified eyelid: Secondary | ICD-10-CM | POA: Diagnosis not present

## 2011-10-16 NOTE — Telephone Encounter (Signed)
Ashley [name], pt returned your call. She says June 13 at 1:30 would be fine. I tried to put this in for you but dr. Ulyses Jarred schedule does not allow me (or other schedulers) to override this so please put pt on this schedule. Thanks. Hazel Sams

## 2011-10-16 NOTE — Telephone Encounter (Signed)
Appt was scheduled.

## 2011-10-19 DIAGNOSIS — M999 Biomechanical lesion, unspecified: Secondary | ICD-10-CM | POA: Diagnosis not present

## 2011-10-19 DIAGNOSIS — M5137 Other intervertebral disc degeneration, lumbosacral region: Secondary | ICD-10-CM | POA: Diagnosis not present

## 2011-10-19 DIAGNOSIS — IMO0002 Reserved for concepts with insufficient information to code with codable children: Secondary | ICD-10-CM | POA: Diagnosis not present

## 2011-10-20 ENCOUNTER — Ambulatory Visit (INDEPENDENT_AMBULATORY_CARE_PROVIDER_SITE_OTHER): Payer: Medicare Other | Admitting: Internal Medicine

## 2011-10-20 ENCOUNTER — Encounter: Payer: Self-pay | Admitting: Internal Medicine

## 2011-10-20 DIAGNOSIS — I5023 Acute on chronic systolic (congestive) heart failure: Secondary | ICD-10-CM | POA: Diagnosis not present

## 2011-10-20 DIAGNOSIS — I428 Other cardiomyopathies: Secondary | ICD-10-CM | POA: Diagnosis not present

## 2011-10-20 DIAGNOSIS — I509 Heart failure, unspecified: Secondary | ICD-10-CM | POA: Diagnosis not present

## 2011-10-20 LAB — ICD DEVICE OBSERVATION
BATTERY VOLTAGE: 3.2045 V
BRDY-0002RV: 40 {beats}/min
CHARGE TIME: 8.918 s
DEV-0020ICD: NEGATIVE
FVT: 0
HV IMPEDENCE: 79 Ohm
PACEART VT: 0
RV LEAD AMPLITUDE: 11.6 mv
RV LEAD IMPEDENCE ICD: 912 Ohm
RV LEAD THRESHOLD: 0.5 V
TOT-0001: 0
TOT-0002: 0
TOT-0006: 20130301000000
TZAT-0001FASTVT: 1
TZAT-0001SLOWVT: 1
TZAT-0002FASTVT: NEGATIVE
TZAT-0002SLOWVT: NEGATIVE
TZAT-0012FASTVT: 170 ms
TZAT-0012SLOWVT: 170 ms
TZAT-0018FASTVT: NEGATIVE
TZAT-0018SLOWVT: NEGATIVE
TZAT-0019FASTVT: 8 V
TZAT-0019SLOWVT: 8 V
TZAT-0020FASTVT: 1.5 ms
TZAT-0020SLOWVT: 1.5 ms
TZON-0003SLOWVT: 360 ms
TZON-0003VSLOWVT: 450 ms
TZON-0004SLOWVT: 16
TZON-0004VSLOWVT: 20
TZON-0005SLOWVT: 12
TZST-0001FASTVT: 2
TZST-0001FASTVT: 3
TZST-0001FASTVT: 4
TZST-0001FASTVT: 5
TZST-0001FASTVT: 6
TZST-0001SLOWVT: 2
TZST-0001SLOWVT: 3
TZST-0001SLOWVT: 4
TZST-0001SLOWVT: 5
TZST-0001SLOWVT: 6
TZST-0002FASTVT: NEGATIVE
TZST-0002FASTVT: NEGATIVE
TZST-0002FASTVT: NEGATIVE
TZST-0002FASTVT: NEGATIVE
TZST-0002FASTVT: NEGATIVE
TZST-0002SLOWVT: NEGATIVE
TZST-0002SLOWVT: NEGATIVE
TZST-0002SLOWVT: NEGATIVE
TZST-0002SLOWVT: NEGATIVE
TZST-0002SLOWVT: NEGATIVE
VENTRICULAR PACING ICD: 0.01 pct
VF: 0

## 2011-10-20 NOTE — Patient Instructions (Signed)
Please discontinue Hydralazine and Imdur Continue all other medications as listed  Follow up in 1 year with Dr Ladona Ridgel.  You will receive a letter in the mail 2 months before you are due.  Please call us when you receive this letter to schedule your follow up appointment.

## 2011-10-20 NOTE — Progress Notes (Signed)
HPI [name] returns today for followup. She is a 66 year old woman with a nonischemic cardiomyopathy, hypertension, and oxygen-dependent lung disease. She is status post ICD implantation. She denies chest pain. She has chronic class II dyspnea which is multifactorial. No peripheral edema. No syncope. She has had no ICD shocks. She has occasional palpitations. She has generalized fatigue and weakness. She notes that her blood pressure has been running in the 90-105 range. Allergies  Allergen Reactions  . Antihistamines, Chlorpheniramine-Type Other (See Comments)    Makes her eyes look like she sees strobe lights  . Influenza Vac Split (Flu Virus Vaccine) Other (See Comments)    "allergic to concentrated eggs that are in vaccine"  . Penicillins     REACTION: "told 50 years ago I couldn't take it; might be immune to it", able to take amoxicillin  . Rosuvastatin Other (See Comments)    REACTION: not effective  . Sulfasalazine Other (See Comments)    "makes my whole body smell like sulfa & makes me sick just smelling it"  . Atorvastatin     REACTION: muscle cramps. Pt states she's taken this now  . Latex Nausea Only and Rash  . Promethazine Hcl Nausea Only, Rash and Other (See Comments)    REACTION: Dizzy, headache     Current Outpatient Prescriptions  Medication Sig Dispense Refill  . ALPRAZolam (XANAX) 0.25 MG tablet Take 0.25 mg by mouth 2 (two) times daily.      Marland Kitchen aspirin 81 MG EC tablet Take 81 mg by mouth at bedtime.       Marland Kitchen CALCIUM-VITAMIN D PO Take 1 capsule by mouth daily.      . carvedilol (COREG) 3.125 MG tablet Take 1 tablet (3.125 mg total) by mouth 2 (two) times daily.  60 tablet  11  . dextromethorphan (DELSYM) 30 MG/5ML liquid Take 60 mg by mouth every 12 (twelve) hours as needed. For cough/congestion.      Marland Kitchen LIPITOR 10 MG tablet TAKE 1 TABLET EVERY DAY  30 tablet  11  . metolazone (ZAROXOLYN) 2.5 MG tablet Take 1 tablet (2.5 mg total) by mouth every other day. Take one  tablet every other day before torsemide  45 tablet  1  . ondansetron (ZOFRAN) 4 MG tablet Take 4 mg by mouth as needed.       . potassium chloride (K-DUR) 10 MEQ tablet Take 3 tablets (30 mEq total) by mouth daily.  90 tablet  3  . torsemide (DEMADEX) 10 MG tablet Take 10 mg by mouth as directed. 2 in am and 1 in pm      . vitamin B-12 (CYANOCOBALAMIN) 500 MCG tablet Take 1 tablet (500 mcg total) by mouth daily.      . vitamin C (ASCORBIC ACID) 500 MG tablet Take 500 mg by mouth 2 (two) times daily.       Marland Kitchen zolpidem (AMBIEN) 5 MG tablet Take 2.5-5 mg by mouth at bedtime as needed. For insomnia       . DISCONTD: bisoprolol (ZEBETA) 5 MG tablet Take 5 mg by mouth daily.         Past Medical History  Diagnosis Date  . Migraine without aura, without mention of intractable migraine without mention of status migrainosus   . Diarrhea   . Diverticulosis of colon     no diverticulitis  . Pure hypercholesterolemia   . Irritable bowel syndrome   . Laryngeal nodule     Dr. Thelma [name]  . Fibrocystic breast disease   .  Osteoarthritis   . Hemorrhoids   . Anxiety   . GERD (gastroesophageal reflux disease)   . Systolic CHF     EF 25-35%, LV dilation, significant MR, goal weight 150lbs  . Nonischemic cardiomyopathy   . Pneumonia 2012  . Mitral regurgitation   . Anxiety   . Pleural effusion, bilateral 2012  . Bronchiectasis     mild, treat URIs aggressively  . Hypertension   . Heart murmur   . Angina   . Lung disease 07/14/11  . Sleep apnea   . Shortness of breath     "resting"  . Anemia   . Fibromyalgia   . Depression     ROS:   All systems reviewed and negative except as noted in the HPI.   Past Surgical History  Procedure Date  . Appendectomy   . Mandible surgery     "got about 6 pins in the bottom of my jaw"  . Knee arthroscopy w/ orif     Left; "meniscus tear"  . Osteotomy     Left foot  . 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 Dexa 03/28/99    osteopenia L/S  .  Colonoscopy 1998    nml (sigmoid-Gilbert-nol)  . Head mri, head ct 10/1998    ? H/A focus (Ashley [name])  . Emg/mcv 10/16/01    + mild carpal tunnel  . Dexa 03/12/03    2.2 Spine 0.1 Hip  Osteopenia  . Mri right hip 12/12/01    Tendonitis Gluteus Medius to Gtr Trochanter Neg Bursitis (Dr. Montez [name])  . L/s films 11/21/01    nml; Right hip nml  . Mri u/s 11/21/01    min. DDD L/3-4  . Colonoscopy 01/04/04    polyps, diverticulosis/severe  . Dexa 11/14/05    1.9 Spine 0.3 Hip  slight improvement  . Flex laryngoscopy 06/11/06    (Ashley [name]) nml  . Dexa 11/22/06    improved osteo and fem neck  . [place] bunionectomy 11/04/08    Right Great Toe (Dr. Lestine [name])  . Breast mass excision 01/2010    Left-fibrocystic change w/intraductal papilloma, no malignancy  . Ct chest 12/2010    possible bronchiectasis lower lobes, 02/2011 - enlarged bilateral effusions, bibasilar atx  . Stress myoview 02/2011    no significant ischemia  . Ct chest 04/2011    no PE.  abnormal R hilar and mediastinal adenopathy --> on rpt 05/2011, resolved  . US echocardiography 04/2011    mildly dilated LV, EF 25-30%, hypokinesis, restrictive physiology, mod MR, no AS  . 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 Tonsillectomy and adenoidectomy   . Breast biopsy     "I've had 2-3 bx; all benign"  . Augmentation mammaplasty   . 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 Abdominal hysterectomy 1987    w/BSO  . Lasik     left eye     Family History  Problem Relation Age of Onset  . Lung cancer Father     + smoker  . Dementia Mother   . Osteoporosis Mother     Lumbar spine  . Stroke Mother     x 5 @ 84 YOA  . Arthritis Mother     hands  . Emphysema Mother   . Alcohol abuse Paternal Grandfather   . Hypertension Maternal Grandfather     ?  . Stroke Paternal Grandfather     ?  Marland Kitchen  Heart disease      grandmother  . Heart disease      grandfather     History   Social History  .  Marital Status: Married    Spouse Name: N/A    Number of Children: 1  . Years of Education: N/A   Occupational History  . 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 Makes [employer]    Social History Main Topics  . Smoking status: Never Smoker   . Smokeless tobacco: Never Used   Comment: Lived with smokers x 23 yrs, smoked herself "for a week"  . Alcohol Use: No  . Drug Use: No  . Sexually Active: Not Currently   Other Topics Concern  . Not on file   Social History Narrative   1 adopted childLives with husband     BP 108/60  Pulse 84  Ht 5' 5.5" (1.664 m)  Wt 149 lb (67.586 kg)  BMI 24.42 kg/m2  Physical Exam:  Well appearing NAD HEENT: Unremarkable Neck:  No JVD, no thyromegally Lymphatics:  No adenopathy Back:  No CVA tenderness Lungs:  Clear HEART:  Regular rate rhythm, no murmurs, no rubs, no clicks Abd:  soft, positive bowel sounds, no organomegally, no rebound, no guarding Ext:  2 plus pulses, no edema, no cyanosis, no clubbing Skin:  No rashes no nodules Neuro:  CN II through XII intact, motor grossly intact  EKG  DEVICE  Normal device function.  See PaceArt for details.   Assess/Plan:

## 2011-10-26 ENCOUNTER — Encounter: Payer: Self-pay | Admitting: Pulmonary Disease

## 2011-10-26 ENCOUNTER — Ambulatory Visit (INDEPENDENT_AMBULATORY_CARE_PROVIDER_SITE_OTHER): Payer: Medicare Other | Admitting: Pulmonary Disease

## 2011-10-26 VITALS — BP 128/74 | HR 85 | Temp 98.1°F | Ht 65.5 in | Wt 150.8 lb

## 2011-10-26 DIAGNOSIS — L570 Actinic keratosis: Secondary | ICD-10-CM | POA: Diagnosis not present

## 2011-10-26 DIAGNOSIS — J9610 Chronic respiratory failure, unspecified whether with hypoxia or hypercapnia: Secondary | ICD-10-CM

## 2011-10-26 DIAGNOSIS — M5137 Other intervertebral disc degeneration, lumbosacral region: Secondary | ICD-10-CM | POA: Diagnosis not present

## 2011-10-26 DIAGNOSIS — J479 Bronchiectasis, uncomplicated: Secondary | ICD-10-CM

## 2011-10-26 DIAGNOSIS — J9611 Chronic respiratory failure with hypoxia: Secondary | ICD-10-CM

## 2011-10-26 DIAGNOSIS — M999 Biomechanical lesion, unspecified: Secondary | ICD-10-CM | POA: Diagnosis not present

## 2011-10-26 DIAGNOSIS — L738 Other specified follicular disorders: Secondary | ICD-10-CM | POA: Diagnosis not present

## 2011-10-26 DIAGNOSIS — D485 Neoplasm of uncertain behavior of skin: Secondary | ICD-10-CM | POA: Diagnosis not present

## 2011-10-26 DIAGNOSIS — IMO0002 Reserved for concepts with insufficient information to code with codable children: Secondary | ICD-10-CM | POA: Diagnosis not present

## 2011-10-26 NOTE — Patient Instructions (Signed)
Continue to follow up with pulmonary rehab as you are doing. Continue using delsym as needed for the cough. Use saline rinses as needed for sinus congestion We will see you back in 4 months or sooner if needed

## 2011-10-26 NOTE — Progress Notes (Signed)
Subjective:    Patient ID: Ashley [name], female    DOB: 09-Oct-1945, 66 y.o.   MRN: [id number] Synopsis: 66 y/o female with chronic cough, intermittent wheezing, and multiple hospitalizations for pneumonia presented to our clinic initially on 02/20/11 for evaluation of the same. She has seen an allergist for years for allergic rhinitis and had been told that she did not have asthma. We ordered an echocardiogram which showed significanly decreased LV function. She underwent a cardiac myoview which showed no evidence of ischaemia. She has been seen in our [place] office multiple times for cough since. She has not taken the chlortrimeton we recommended. She was seen by a gastroenterologist in the interim who recommended that she continue taking pepcid and famotidine. In approximately the end of 10/12 she was put on asthmanex by her allergist. On 11/12 she was given a prenisone taper for continued cough.   HPI     04/03/11 ROV --[name] has had a busy 6 weeks since our last visit as detailed above. She has multiple complaints today, but her primary respiratory complaints are wheezing, shortness of breath, and continuous cough productive of clear sputum. She says that the symptoms are worse at night when she lies flat, so she has to prop her head up. She is a little more swollen than usual. She says that the tramadol makes her stomach hurt. She doesn't think that the pepcid helps. Her allergist put her on Asthmanex but she doesn't think that it has helped. She was put on a prednisone taper last week and she says that this has not helped her wheeze or shortness of breath at all. She wheezes nearly continuously. No fevers or chills.  She notes that she frequently wakes up many times during the night choking and gasping for air. Her husband says that she stops breathing at times.  She also notes waking up choking frequently in the middle of the night.  Her sinuses have been well controlled recently.    04/26/11 ROV -- [name] returns for further evaluation of shortness of breath and cough. She states that she has had chills at home in the last week, associated with sorethroat, cough, wheezing (especially at night), clear sputum production, and chest pain with cough. She says that she has woken up multiple times at night with wheezing which is relieved with albuterol.  She stopped taking the bystolic and feels better now (less short of breath). She has also been taking more furosemide per Dr. Windell Hummingbird instructions.   05/24/11 ROV/Hosp f/u-- She reports that she is doing fairly well after her recent hospitalization for volume overload and cough. Her cough has improved quite a bit since then and she is not as short of breath since starting on oxygen. She only has scant clear sputum production. She notes that she does not urinate much on her current dose of lasix. She is taking the lasix and metolazone as written. She stopped taking the alvesco and nebulized meds.  07/31/2011 ROV-- She is doing fairly well. She has some green sputum and sinus congestion on occasion, worse in the last three weeks.  No fevers or chills or change in baseline dyspnea.  For the most part she feels her breathing has improved. She has had some eratic blood pressures at home last week.   She is adjusting her lasix for a goal weight of 150 lbs.   10/26/2011 ROV -- [name] is doing pretty well.  Her energy has been up and down but  overall she is better.  She is going to pulm rehab Mon and Friday.  She apparently recently stopped two of her blood pressure meds. She does not complain of dyspnea but she notes some fatigue and leg cramps with a lot of stiffness.  She still has occasional coughing paroxysms. She thinks that her cough is better while using the continuous O2.  She notes nose bleeding from the oxygen use.    Past Medical History  Diagnosis Date  . Migraine without aura, without mention of intractable migraine without  mention of status migrainosus   . Diarrhea   . Diverticulosis of colon     no diverticulitis  . Pure hypercholesterolemia   . Irritable bowel syndrome   . Laryngeal nodule     Dr. Thelma Comp  . Fibrocystic breast disease   . Osteoarthritis   . Hemorrhoids   . Anxiety   . GERD (gastroesophageal reflux disease)   . Systolic CHF     EF 25-35%, LV dilation, significant MR, goal weight 150lbs  . Nonischemic cardiomyopathy   . Pneumonia 2012  . Mitral regurgitation   . Anxiety   . Pleural effusion, bilateral 2012  . Bronchiectasis     mild, treat URIs aggressively  . Hypertension   . Heart murmur   . Angina   . Lung disease 07/14/11  . Sleep apnea   . Shortness of breath     "resting"  . Anemia   . Fibromyalgia   . Depression       Review of Systems  Constitutional: Negative for chills, activity change, appetite change and fatigue.  HENT: Positive for nosebleeds.   Respiratory: Positive for cough. Negative for choking, chest tightness and wheezing.   Cardiovascular: Negative for chest pain, palpitations and leg swelling.       Objective:   Physical Exam  Filed Vitals:   10/26/11 1321  BP: 128/74  Pulse: 85  Temp: 98.1 F (36.7 C)  TempSrc: Oral  Height: 5' 5.5" (1.664 m)  Weight: 150 lb 12.8 oz (68.402 kg)  SpO2: 100%   Gen:  no acute distress HEENT: NCAT, PERRL, EOMi, OP clear, neck supple without masses PULM: CTA B CV: RRR, no mgr, no JVD Chest: ICD pocket is not swollen tender or red Ext: warm, no edema, no clubbing, no cyanosis  12/14 /12 CT Thorax: mediastinal lymphadenopathy, question right hilar mass, mild bronchiectasis in bases, pleural effusion with increased interlobular thickening  06/12/11 CT thorax: IMPRESSION:  Interval clearing of congestive heart failure and associated  reactive mediastinal and bihilar adenopathy. No evidence of  interstitial lung disease.     Assessment & Plan:   Bronchiectasis I explained to [facility] today that I have  reviewed her CT scan (again) and that there is little evidence that she has significant bronchiectasis.    Plan: -she tends to develop purulent bronchitis quickly with URI's, so favor early antibiotics with these infections -see discussion of oxygen use  Hypoxemic respiratory failure, chronic This problem was really only present when she was volume overloaded briefly back in the winter with decompensated heart failure.  She walked 500 feet in clinic today at a brisk pace and her oxygenation never dropped below 92%.  She does not need it now.  Plan: -d/c oxygen -monitor O2 saturation with vigorous exercise at [facility] and let us know if she drops  Cardiomyopathy Agree with current regimen.  She has finally gotten the handle on managing her weight it seems.  Continue current diuretics and  BP management per Dr. Windell Hummingbird rec's.     Updated Medication List Outpatient Encounter Prescriptions as of 10/26/2011  Medication Sig Dispense Refill  . ALPRAZolam (XANAX) 0.25 MG tablet Take 0.25 mg by mouth 2 (two) times daily.      Marland Kitchen aspirin 81 MG EC tablet Take 81 mg by mouth at bedtime.       Marland Kitchen CALCIUM-VITAMIN D PO Take 1 capsule by mouth daily.      . carvedilol (COREG) 3.125 MG tablet Take 1 tablet (3.125 mg total) by mouth 2 (two) times daily.  60 tablet  11  . dextromethorphan (DELSYM) 30 MG/5ML liquid Take 60 mg by mouth every 12 (twelve) hours as needed. For cough/congestion.      Marland Kitchen LIPITOR 10 MG tablet TAKE 1 TABLET EVERY DAY  30 tablet  11  . metolazone (ZAROXOLYN) 2.5 MG tablet Take 1 tablet (2.5 mg total) by mouth every other day. Take one tablet every other day before torsemide  45 tablet  1  . ondansetron (ZOFRAN) 4 MG tablet Take 4 mg by mouth as needed.       . potassium chloride (K-DUR) 10 MEQ tablet Take 3 tablets (30 mEq total) by mouth daily.  90 tablet  3  . torsemide (DEMADEX) 10 MG tablet Take 10 mg by mouth as directed. 2 in am and 1 in pm      . vitamin B-12 (CYANOCOBALAMIN)  500 MCG tablet Take 1 tablet (500 mcg total) by mouth daily.      . vitamin C (ASCORBIC ACID) 500 MG tablet Take 500 mg by mouth 2 (two) times daily.       Marland Kitchen zolpidem (AMBIEN) 5 MG tablet Take 2.5-5 mg by mouth at bedtime as needed. For insomnia

## 2011-10-27 NOTE — Assessment & Plan Note (Signed)
This problem was really only present when she was volume overloaded briefly back in the winter with decompensated heart failure.  She walked 500 feet in clinic today at a brisk pace and her oxygenation never dropped below 92%.  She does not need it now.  Plan: -d/c oxygen -monitor O2 saturation with vigorous exercise at [name] and let us know if she drops

## 2011-10-27 NOTE — Assessment & Plan Note (Signed)
Agree with current regimen.  She has finally gotten the handle on managing her weight it seems.  Continue current diuretics and BP management per Dr. Windell Hummingbird rec's.

## 2011-10-27 NOTE — Assessment & Plan Note (Signed)
I explained to Ashley [name] today that I have reviewed her CT scan (again) and that there is little evidence that she has significant bronchiectasis.    Plan: -she tends to develop purulent bronchitis quickly with URI's, so favor early antibiotics with these infections -see discussion of oxygen use

## 2011-10-30 ENCOUNTER — Encounter: Payer: Self-pay | Admitting: Cardiovascular Disease

## 2011-10-30 ENCOUNTER — Ambulatory Visit: Payer: Medicare Other | Admitting: Cardiovascular Disease

## 2011-10-30 ENCOUNTER — Ambulatory Visit (INDEPENDENT_AMBULATORY_CARE_PROVIDER_SITE_OTHER): Payer: Medicare Other | Admitting: Cardiovascular Disease

## 2011-10-30 VITALS — BP 100/68 | HR 92 | Ht 65.0 in | Wt 149.0 lb

## 2011-10-30 DIAGNOSIS — R079 Chest pain, unspecified: Secondary | ICD-10-CM | POA: Diagnosis not present

## 2011-10-30 DIAGNOSIS — I5023 Acute on chronic systolic (congestive) heart failure: Secondary | ICD-10-CM | POA: Diagnosis not present

## 2011-10-30 DIAGNOSIS — I509 Heart failure, unspecified: Secondary | ICD-10-CM

## 2011-10-30 DIAGNOSIS — R000 Tachycardia, unspecified: Secondary | ICD-10-CM

## 2011-10-30 DIAGNOSIS — R0602 Shortness of breath: Secondary | ICD-10-CM

## 2011-10-30 NOTE — Patient Instructions (Addendum)
You are doing well. No medication changes were made.  Please call us if you have new issues that need to be addressed before your next appt.  Your physician wants you to follow-up in: 6 months.  You will receive a reminder letter in the mail two months in advance. If you don't receive a letter, please call our office to schedule the follow-up appointment.   

## 2011-10-30 NOTE — Assessment & Plan Note (Addendum)
She has done a very good job maintaining her weight. I suspect she is likely mildly hypovolemic. We have suggested she try to keep her weight 150 or higher. Mild dehydration could be accelerating her heart rate. I hesitate to increase her beta blocker given recent problems with bronchospasm over the past year. She will monitor her blood pressure and heart rate for [place]. If weight is less than 150, I have suggested she hold her diuretic. We will not add ACE inhibitor at this time given her low blood pressure. She's not taking hydralazine and we will not restart this unless her blood pressure shows she has some wiggle room for new medication.

## 2011-10-30 NOTE — Assessment & Plan Note (Signed)
Heart rate mildly elevated. We will raise her goal weight to make sure this is not secondary to dehydration. Consider increasing beta blocker if this persists despite weight gain.

## 2011-10-30 NOTE — Assessment & Plan Note (Signed)
Significantly improved shortness of breath. Still with fatigue. Now no longer on oxygen.

## 2011-10-30 NOTE — Progress Notes (Signed)
Patient ID: Ashley [name], female    DOB: 04-21-1946, 66 y.o.   MRN: [id number]  HPI Comments: 66 y/o female with history of pneumonia, chronic cough, diagnosis of COPD made during a hospital course at [facility] (abnormal PFTs),  shortness of breath dating back to September 2012 that presented acutely on September 1, CT scan showing progressive pleural effusion on a repeat scan , echo showing Cardiomyopathy  ejection fraction estimated at 25-35%,  Previous  Lexiscan Myoview  showed no significant ischemia, no EKG changes concerning for ischemia.   Follow up admission to [facility] in December 2012 for bronchitis and hypoxia as well as systolic CHF. She had antibiotics, prednisone and diuresis and has felt better since discharge.  She has done well over the past several months. She is participating in pulmonary rehabilitation twice a week. Coughing is better on oxygen though now that she is not on oxygen, she feels well. She does have followup with GI for her cough and has EGD scheduled. She does report having some blurry vision and wonders if it could be the medications. Blood pressure is sometimes low though improved after she stopped hydralazine. She was instructed to take this twice a day add low dose rather than 3 times a day. She thought she was supposed to stop the medication altogether. Occasional blood pressures in the high 90s but with no symptoms. Heart rate has been running somewhat high in the 90s, occasional 100 at rest. She does not drink much fluids, mainly small amounts of juice and ice chips ACE inhibitor is on hold from several months ago secondary to chronic cough.   S/P ICD 10 days ago for EF <35%. She has follow up with EP later this week.   Echocardiogram on May 02 2011 showed moderate pulmonary hypertension, ejection fraction 25%.  Cardiac MRI showed ejection fraction 34% ICD placed earlier this year by Dr. Ladona Ridgel.  EKG shows normal sinus rhythm with rate 92  beats per minute with nonspecific ST abnormality, PVCs noted      Outpatient Encounter Prescriptions as of 10/30/2011  Medication Sig Dispense Refill  . ALPRAZolam (XANAX) 0.25 MG tablet Take 0.25 mg by mouth 2 (two) times daily.      Marland Kitchen aspirin 81 MG EC tablet Take 81 mg by mouth at bedtime.       Marland Kitchen CALCIUM-VITAMIN D PO Take 1 capsule by mouth daily.      . carvedilol (COREG) 3.125 MG tablet Take 1 tablet (3.125 mg total) by mouth 2 (two) times daily.  60 tablet  11  . dextromethorphan (DELSYM) 30 MG/5ML liquid Take 60 mg by mouth every 12 (twelve) hours as needed. For cough/congestion.      Marland Kitchen LIPITOR 10 MG tablet TAKE 1 TABLET EVERY DAY  30 tablet  11  . metolazone (ZAROXOLYN) 2.5 MG tablet Take 1 tablet (2.5 mg total) by mouth every other day. Take one tablet every other day before torsemide  45 tablet  1  . ondansetron (ZOFRAN) 4 MG tablet Take 4 mg by mouth as needed.       . potassium chloride (K-DUR) 10 MEQ tablet Take 30 mEq by mouth daily as needed.      . torsemide (DEMADEX) 10 MG tablet Take 10 mg by mouth as directed. 2 in am and 1 in pm      . vitamin B-12 (CYANOCOBALAMIN) 500 MCG tablet Take 1 tablet (500 mcg total) by mouth daily.      . vitamin  C (ASCORBIC ACID) 500 MG tablet Take 500 mg by mouth 2 (two) times daily.       Marland Kitchen zolpidem (AMBIEN) 5 MG tablet Take 2.5-5 mg by mouth at bedtime as needed. For insomnia        Review of Systems  Constitutional: Positive for fatigue.       On 2 L nasal cannula oxygen  HENT: Negative.   Eyes: Negative.   Cardiovascular: Negative.   Gastrointestinal: Negative.   Musculoskeletal: Negative.   Skin: Negative.   Neurological: Negative.   Hematological: Negative.   Psychiatric/Behavioral: Negative.   All other systems reviewed and are negative.     BP 100/68  Ht 5\' 5"  (1.651 m)  Wt 149 lb (67.586 kg)  BMI 24.79 kg/m2  Physical Exam  Nursing note and vitals reviewed. Constitutional: She is oriented to person, place, and  time. She appears well-developed and well-nourished.  HENT:  Head: Normocephalic.  Nose: Nose normal.  Mouth/Throat: Oropharynx is clear and moist.  Eyes: Conjunctivae are normal. Pupils are equal, round, and reactive to light.  Neck: Normal range of motion. Neck supple. No JVD present.  Cardiovascular: Normal rate, regular rhythm, S1 normal, S2 normal, normal heart sounds and intact distal pulses.  Exam reveals no gallop and no friction rub.   No murmur heard. Pulmonary/Chest: Effort normal and breath sounds normal. No respiratory distress. She has no wheezes. She has no rales. She exhibits no tenderness.  Abdominal: Soft. Bowel sounds are normal. She exhibits no distension. There is no tenderness.  Musculoskeletal: Normal range of motion. She exhibits no edema and no tenderness.  Lymphadenopathy:    She has no cervical adenopathy.  Neurological: She is alert and oriented to person, place, and time. Coordination normal.  Skin: Skin is warm and dry. No rash noted. No erythema.  Psychiatric: She has a normal mood and affect. Her behavior is normal. Judgment and thought content normal.         Assessment and Plan

## 2011-11-08 ENCOUNTER — Other Ambulatory Visit: Payer: Self-pay | Admitting: *Deleted

## 2011-11-08 DIAGNOSIS — IMO0002 Reserved for concepts with insufficient information to code with codable children: Secondary | ICD-10-CM | POA: Diagnosis not present

## 2011-11-08 DIAGNOSIS — M5137 Other intervertebral disc degeneration, lumbosacral region: Secondary | ICD-10-CM | POA: Diagnosis not present

## 2011-11-08 DIAGNOSIS — M999 Biomechanical lesion, unspecified: Secondary | ICD-10-CM | POA: Diagnosis not present

## 2011-11-08 MED ORDER — ALPRAZOLAM 0.25 MG PO TABS: 0.2500 mg | ORAL_TABLET | Freq: Two times a day (BID) | ORAL | Status: DC

## 2011-11-08 NOTE — Telephone Encounter (Signed)
plz phone in. 

## 2011-11-08 NOTE — Telephone Encounter (Signed)
Called in Rx as prescribed 

## 2011-11-09 ENCOUNTER — Telehealth: Payer: Self-pay | Admitting: Family Medicine

## 2011-11-09 DIAGNOSIS — N289 Disorder of kidney and ureter, unspecified: Secondary | ICD-10-CM | POA: Insufficient documentation

## 2011-11-09 DIAGNOSIS — N183 Chronic kidney disease, stage 3 unspecified: Secondary | ICD-10-CM | POA: Insufficient documentation

## 2011-11-09 DIAGNOSIS — E876 Hypokalemia: Secondary | ICD-10-CM

## 2011-11-09 NOTE — Telephone Encounter (Signed)
Asher Muir advised she wants a test for O2 toxicity.

## 2011-11-09 NOTE — Telephone Encounter (Signed)
Pt is very concerned because she had pneumonia back in December and was put on oxygen. She went to Dr .Henrene Pastor and he said she wasn't supposed to stay on oxygen that long because its doing her more harm than anything and she's scared she has Oxygen Toxicity??? She was wanting a call back about everything she has going on.

## 2011-11-09 NOTE — Telephone Encounter (Signed)
Spoke with pt, discussed concerns.  Tried to reassure as much as possible. Very anxious, stressed this week more than others with recent med changes.  Not feeling supported as much as she would like to be. Recommended come in if feeling overwhelmed to discuss better long term anxiety management (ie SSRI, CBT). I do believe anxiety plays significant factor in day to day well being. Come in in 1-2 wks to recheck Cr and K given h/o labile K and renal insuff on torsemide.

## 2011-11-10 ENCOUNTER — Telehealth: Payer: Self-pay

## 2011-11-10 ENCOUNTER — Other Ambulatory Visit: Payer: Self-pay

## 2011-11-10 MED ORDER — CARVEDILOL 12.5 MG PO TABS: 6.2500 mg | ORAL_TABLET | Freq: Two times a day (BID) | ORAL | Status: DC

## 2011-11-10 NOTE — Telephone Encounter (Signed)
Pt called to say at last OV Dr. Mariah Milling told her she could increase coreg if needed, based on high HR.  She had been taking 3.125 mg Coreg BID. HR is still between 106-116 BPM.  She increased her Coreg to 3.125 mg TID on her own. She says this has not helped after 3 weeks of this dose and wonders what Dr. Mariah Milling needs her to do.  BP=109/52-132/58 She still c/o sharp pain under left breast (Dr. Mariah Milling is aware at last appt).  I told her I would discuss with him and call her back.  Understanding verb.

## 2011-11-10 NOTE — Telephone Encounter (Signed)
Discussed with Dr. Mariah Milling who suggests increasing Coreg to 6.25 mg PO BID, continue to monitor BP/HR and call us back.  Pt was informed.  Understanding verb.  Will send in 12.5 mg tablets for her to cut in half and take 1/2 tablet BID

## 2011-11-13 ENCOUNTER — Encounter: Payer: Self-pay | Admitting: Pulmonary Disease

## 2011-11-13 DIAGNOSIS — K219 Gastro-esophageal reflux disease without esophagitis: Secondary | ICD-10-CM | POA: Insufficient documentation

## 2011-11-13 DIAGNOSIS — R05 Cough: Secondary | ICD-10-CM | POA: Diagnosis not present

## 2011-11-13 DIAGNOSIS — J309 Allergic rhinitis, unspecified: Secondary | ICD-10-CM | POA: Insufficient documentation

## 2011-11-13 DIAGNOSIS — IMO0002 Reserved for concepts with insufficient information to code with codable children: Secondary | ICD-10-CM | POA: Diagnosis not present

## 2011-11-13 DIAGNOSIS — J329 Chronic sinusitis, unspecified: Secondary | ICD-10-CM | POA: Diagnosis not present

## 2011-11-13 DIAGNOSIS — D649 Anemia, unspecified: Secondary | ICD-10-CM | POA: Diagnosis not present

## 2011-11-13 DIAGNOSIS — R0602 Shortness of breath: Secondary | ICD-10-CM | POA: Diagnosis not present

## 2011-11-13 DIAGNOSIS — M999 Biomechanical lesion, unspecified: Secondary | ICD-10-CM | POA: Diagnosis not present

## 2011-11-13 DIAGNOSIS — J479 Bronchiectasis, uncomplicated: Secondary | ICD-10-CM | POA: Diagnosis not present

## 2011-11-13 DIAGNOSIS — Z5189 Encounter for other specified aftercare: Secondary | ICD-10-CM | POA: Diagnosis not present

## 2011-11-13 DIAGNOSIS — R059 Cough, unspecified: Secondary | ICD-10-CM | POA: Diagnosis not present

## 2011-11-13 DIAGNOSIS — I428 Other cardiomyopathies: Secondary | ICD-10-CM | POA: Diagnosis not present

## 2011-11-13 DIAGNOSIS — R0609 Other forms of dyspnea: Secondary | ICD-10-CM | POA: Diagnosis not present

## 2011-11-13 DIAGNOSIS — R5383 Other fatigue: Secondary | ICD-10-CM | POA: Diagnosis not present

## 2011-11-13 DIAGNOSIS — M5137 Other intervertebral disc degeneration, lumbosacral region: Secondary | ICD-10-CM | POA: Diagnosis not present

## 2011-11-13 DIAGNOSIS — Z9109 Other allergy status, other than to drugs and biological substances: Secondary | ICD-10-CM | POA: Diagnosis not present

## 2011-11-13 DIAGNOSIS — R5381 Other malaise: Secondary | ICD-10-CM | POA: Diagnosis not present

## 2011-11-13 DIAGNOSIS — I509 Heart failure, unspecified: Secondary | ICD-10-CM | POA: Diagnosis not present

## 2011-11-14 ENCOUNTER — Other Ambulatory Visit (INDEPENDENT_AMBULATORY_CARE_PROVIDER_SITE_OTHER): Payer: Medicare Other

## 2011-11-14 DIAGNOSIS — E876 Hypokalemia: Secondary | ICD-10-CM

## 2011-11-14 DIAGNOSIS — N289 Disorder of kidney and ureter, unspecified: Secondary | ICD-10-CM

## 2011-11-14 LAB — BASIC METABOLIC PANEL
BUN: 38 mg/dL — ABNORMAL HIGH (ref 6–23)
CO2: 32 mEq/L (ref 19–32)
Calcium: 10.2 mg/dL (ref 8.4–10.5)
Chloride: 100 mEq/L (ref 96–112)
Creatinine, Ser: 1.2 mg/dL (ref 0.4–1.2)
GFR: 48.66 mL/min — ABNORMAL LOW (ref >60.00)
Glucose, Bld: 95 mg/dL (ref 70–99)
Potassium: 3.6 mEq/L (ref 3.5–5.1)
Sodium: 141 mEq/L (ref 135–145)

## 2011-11-15 ENCOUNTER — Telehealth: Payer: Self-pay

## 2011-11-15 DIAGNOSIS — Z5189 Encounter for other specified aftercare: Secondary | ICD-10-CM | POA: Diagnosis not present

## 2011-11-15 DIAGNOSIS — I509 Heart failure, unspecified: Secondary | ICD-10-CM | POA: Diagnosis not present

## 2011-11-15 DIAGNOSIS — R0609 Other forms of dyspnea: Secondary | ICD-10-CM | POA: Diagnosis not present

## 2011-11-15 NOTE — Telephone Encounter (Signed)
Pt stopped by to ask if ok to have tests done as ordered by Dr. Marisa Sprinkles.  These include CT chest w/o contrast, PFTs, and exercise test (PFTs).

## 2011-11-15 NOTE — Telephone Encounter (Signed)
Pt informed ok to have procedures as ordered by Dr. Marisa Sprinkles.  Understanding verb.

## 2011-11-17 DIAGNOSIS — Z5189 Encounter for other specified aftercare: Secondary | ICD-10-CM | POA: Diagnosis not present

## 2011-11-17 DIAGNOSIS — I509 Heart failure, unspecified: Secondary | ICD-10-CM | POA: Diagnosis not present

## 2011-11-17 DIAGNOSIS — R0609 Other forms of dyspnea: Secondary | ICD-10-CM | POA: Diagnosis not present

## 2011-11-20 ENCOUNTER — Encounter: Payer: Self-pay | Admitting: Pulmonary Disease

## 2011-11-20 DIAGNOSIS — I509 Heart failure, unspecified: Secondary | ICD-10-CM | POA: Diagnosis not present

## 2011-11-20 DIAGNOSIS — Z5189 Encounter for other specified aftercare: Secondary | ICD-10-CM | POA: Diagnosis not present

## 2011-11-20 DIAGNOSIS — R0609 Other forms of dyspnea: Secondary | ICD-10-CM | POA: Diagnosis not present

## 2011-11-22 DIAGNOSIS — IMO0002 Reserved for concepts with insufficient information to code with codable children: Secondary | ICD-10-CM | POA: Diagnosis not present

## 2011-11-22 DIAGNOSIS — M999 Biomechanical lesion, unspecified: Secondary | ICD-10-CM | POA: Diagnosis not present

## 2011-11-22 DIAGNOSIS — I509 Heart failure, unspecified: Secondary | ICD-10-CM | POA: Diagnosis not present

## 2011-11-22 DIAGNOSIS — R0609 Other forms of dyspnea: Secondary | ICD-10-CM | POA: Diagnosis not present

## 2011-11-22 DIAGNOSIS — M5137 Other intervertebral disc degeneration, lumbosacral region: Secondary | ICD-10-CM | POA: Diagnosis not present

## 2011-11-22 DIAGNOSIS — Z5189 Encounter for other specified aftercare: Secondary | ICD-10-CM | POA: Diagnosis not present

## 2011-11-24 DIAGNOSIS — R0609 Other forms of dyspnea: Secondary | ICD-10-CM | POA: Diagnosis not present

## 2011-11-24 DIAGNOSIS — Z5189 Encounter for other specified aftercare: Secondary | ICD-10-CM | POA: Diagnosis not present

## 2011-11-24 DIAGNOSIS — R0989 Other specified symptoms and signs involving the circulatory and respiratory systems: Secondary | ICD-10-CM | POA: Diagnosis not present

## 2011-11-24 DIAGNOSIS — I509 Heart failure, unspecified: Secondary | ICD-10-CM | POA: Diagnosis not present

## 2011-11-27 ENCOUNTER — Telehealth: Payer: Self-pay | Admitting: Family Medicine

## 2011-11-27 DIAGNOSIS — Z5189 Encounter for other specified aftercare: Secondary | ICD-10-CM | POA: Diagnosis not present

## 2011-11-27 DIAGNOSIS — I509 Heart failure, unspecified: Secondary | ICD-10-CM | POA: Diagnosis not present

## 2011-11-27 DIAGNOSIS — R0609 Other forms of dyspnea: Secondary | ICD-10-CM | POA: Diagnosis not present

## 2011-11-27 NOTE — Telephone Encounter (Signed)
Caller: Ashley [name]/Patient; PCP: Ronna Polio; CB#: [phone]; Call regarding Cough/Congestion with sputum white /green in color.  No fever.  Denies any recent wt. gain.   Pt. states that she sees Dr. Ivan Croft at the [facility] office only.  Please advise on appt. time.  She is available Tuesday 7/16 after 11am.

## 2011-11-27 NOTE — Telephone Encounter (Signed)
Lets schedule her for Tuesday at 11:15am.

## 2011-11-28 ENCOUNTER — Ambulatory Visit (INDEPENDENT_AMBULATORY_CARE_PROVIDER_SITE_OTHER): Payer: Medicare Other | Admitting: Family Medicine

## 2011-11-28 ENCOUNTER — Encounter: Payer: Self-pay | Admitting: Family Medicine

## 2011-11-28 VITALS — BP 94/62 | HR 80 | Temp 97.6°F | Wt 151.8 lb

## 2011-11-28 DIAGNOSIS — R0989 Other specified symptoms and signs involving the circulatory and respiratory systems: Secondary | ICD-10-CM | POA: Diagnosis not present

## 2011-11-28 DIAGNOSIS — L919 Hypertrophic disorder of the skin, unspecified: Secondary | ICD-10-CM | POA: Diagnosis not present

## 2011-11-28 DIAGNOSIS — L570 Actinic keratosis: Secondary | ICD-10-CM | POA: Diagnosis not present

## 2011-11-28 DIAGNOSIS — L821 Other seborrheic keratosis: Secondary | ICD-10-CM | POA: Diagnosis not present

## 2011-11-28 DIAGNOSIS — L909 Atrophic disorder of skin, unspecified: Secondary | ICD-10-CM | POA: Diagnosis not present

## 2011-11-28 NOTE — Progress Notes (Signed)
  Subjective:    Patient ID: Ashley [name], female    DOB: [birth year], 66 y.o.   MRN: [id number]  HPI CC: URI sxs  Ashley [name] presents today as acute visit for possible URTI.  States 1 d h/o some mild green colored sputum when coughing, intersperced with clear mucous.  Coughing is at baseline.  No fevers/chills, chest pain, SOB, leg swelling, sinus pain or pressure, head congestion/coryza, rhinorrhea.  No ST, ear pain, tooth pain.  Off oxygen, tolerating well.  Off imdur and hydralazine.  bp remains soft.  Has appt with [facility] doctor on Monday.  Then has EGD scheduled with GI.  Rpt CT scan coming up as well.  Feels anxiety increasing - more trouble dealing with issues, more irritable, very anxious.  Past Medical History  Diagnosis Date  . Migraine without aura, without mention of intractable migraine without mention of status migrainosus   . Diarrhea   . Diverticulosis of colon     no diverticulitis  . Pure hypercholesterolemia   . Irritable bowel syndrome   . Laryngeal nodule     Dr. Thelma Comp  . Fibrocystic breast disease   . Osteoarthritis   . Hemorrhoids   . Anxiety   . GERD (gastroesophageal reflux disease)   . Systolic CHF     EF 25-35%, LV dilation, significant MR, goal weight 150lbs  . Nonischemic cardiomyopathy   . Pneumonia 2012  . Mitral regurgitation   . Anxiety   . Pleural effusion, bilateral 2012  . Bronchiectasis     mild, treat URIs aggressively  . Hypertension   . Heart murmur   . Angina   . Lung disease 07/14/11  . Sleep apnea   . Shortness of breath     "resting"  . Anemia   . Fibromyalgia   . Depression     Review of Systems Per HPI    Objective:   Physical Exam  Nursing note and vitals reviewed. Constitutional: She appears well-developed and well-nourished. No distress.       Improved energy compared to prior visits. Off oxygen.  HENT:  Head: Normocephalic and atraumatic.  Right Ear: Hearing, tympanic membrane, external ear and ear  canal normal.  Left Ear: Hearing, tympanic membrane, external ear and ear canal normal.  Nose: Nose normal. No mucosal edema or rhinorrhea. Right sinus exhibits no maxillary sinus tenderness and no frontal sinus tenderness. Left sinus exhibits no maxillary sinus tenderness and no frontal sinus tenderness.  Mouth/Throat: Uvula is midline, oropharynx is clear and moist and mucous membranes are normal. No oropharyngeal exudate, posterior oropharyngeal edema, posterior oropharyngeal erythema or tonsillar abscesses.  Eyes: Conjunctivae and EOM are normal. Pupils are equal, round, and reactive to light. No scleral icterus.  Neck: Normal range of motion. Neck supple.  Cardiovascular: Normal rate, regular rhythm, normal heart sounds and intact distal pulses.   No murmur heard. Pulmonary/Chest: Effort normal and breath sounds normal. No respiratory distress. She has no wheezes. She has no rales.       Good air movement  Lymphadenopathy:    She has no cervical adenopathy.  Skin: Skin is warm and dry. No rash noted.       Assessment & Plan:

## 2011-11-28 NOTE — Patient Instructions (Signed)
I dont think there's currently infection. Take guaifenesin immediate release over the counter with plenty of fluid to help mobilize mucous. Watch for fever >101 or worsening cough. Increase xanax (alprazolam) to 2 pills daily, and a third pill as needed.  If needing regularly, let me know and we may start another daily medicine.

## 2011-11-28 NOTE — Telephone Encounter (Signed)
Attempted to call patient. Went to VM. Scheduled for 1145 since unable to reach and another appt was scheduled for 1115. Left message advising to call office if unable to make appointment.

## 2011-11-28 NOTE — Assessment & Plan Note (Signed)
Mild, no evidence of active infection, either viral or bacterial.  Will hold abx for now.  Recommend regular use of guaifenesin - states has tolerated and done well with this in past. If red flags or not improving as expected, to notify me and likely would send in course of abx given h/o mild bronchiectasis, want to treat URIs aggressively.

## 2011-11-30 DIAGNOSIS — M5137 Other intervertebral disc degeneration, lumbosacral region: Secondary | ICD-10-CM | POA: Diagnosis not present

## 2011-11-30 DIAGNOSIS — IMO0002 Reserved for concepts with insufficient information to code with codable children: Secondary | ICD-10-CM | POA: Diagnosis not present

## 2011-11-30 DIAGNOSIS — M999 Biomechanical lesion, unspecified: Secondary | ICD-10-CM | POA: Diagnosis not present

## 2011-12-04 DIAGNOSIS — M5137 Other intervertebral disc degeneration, lumbosacral region: Secondary | ICD-10-CM | POA: Diagnosis not present

## 2011-12-04 DIAGNOSIS — M999 Biomechanical lesion, unspecified: Secondary | ICD-10-CM | POA: Diagnosis not present

## 2011-12-04 DIAGNOSIS — IMO0002 Reserved for concepts with insufficient information to code with codable children: Secondary | ICD-10-CM | POA: Diagnosis not present

## 2011-12-06 DIAGNOSIS — M999 Biomechanical lesion, unspecified: Secondary | ICD-10-CM | POA: Diagnosis not present

## 2011-12-06 DIAGNOSIS — IMO0002 Reserved for concepts with insufficient information to code with codable children: Secondary | ICD-10-CM | POA: Diagnosis not present

## 2011-12-06 DIAGNOSIS — M5137 Other intervertebral disc degeneration, lumbosacral region: Secondary | ICD-10-CM | POA: Diagnosis not present

## 2011-12-08 ENCOUNTER — Ambulatory Visit: Payer: Medicare Other | Admitting: Internal Medicine

## 2011-12-11 ENCOUNTER — Other Ambulatory Visit: Payer: Self-pay | Admitting: *Deleted

## 2011-12-11 DIAGNOSIS — R918 Other nonspecific abnormal finding of lung field: Secondary | ICD-10-CM | POA: Diagnosis not present

## 2011-12-11 DIAGNOSIS — J438 Other emphysema: Secondary | ICD-10-CM | POA: Diagnosis not present

## 2011-12-11 DIAGNOSIS — R05 Cough: Secondary | ICD-10-CM | POA: Diagnosis not present

## 2011-12-11 DIAGNOSIS — IMO0002 Reserved for concepts with insufficient information to code with codable children: Secondary | ICD-10-CM | POA: Diagnosis not present

## 2011-12-11 DIAGNOSIS — M999 Biomechanical lesion, unspecified: Secondary | ICD-10-CM | POA: Diagnosis not present

## 2011-12-11 DIAGNOSIS — M5137 Other intervertebral disc degeneration, lumbosacral region: Secondary | ICD-10-CM | POA: Diagnosis not present

## 2011-12-11 DIAGNOSIS — R0602 Shortness of breath: Secondary | ICD-10-CM | POA: Diagnosis not present

## 2011-12-11 DIAGNOSIS — R059 Cough, unspecified: Secondary | ICD-10-CM | POA: Diagnosis not present

## 2011-12-11 MED ORDER — ALPRAZOLAM 0.25 MG PO TABS: 0.2500 mg | ORAL_TABLET | Freq: Two times a day (BID) | ORAL | Status: DC

## 2011-12-11 NOTE — Telephone Encounter (Signed)
Received faxed refill request from pharmacy. Last office visit 11/28/11. Is it okay to refill medication?

## 2011-12-11 NOTE — Telephone Encounter (Signed)
plz phone in. 

## 2011-12-12 DIAGNOSIS — R059 Cough, unspecified: Secondary | ICD-10-CM | POA: Diagnosis not present

## 2011-12-12 DIAGNOSIS — R05 Cough: Secondary | ICD-10-CM | POA: Diagnosis not present

## 2011-12-12 NOTE — Telephone Encounter (Signed)
Rx called in as directed.   

## 2011-12-20 DIAGNOSIS — IMO0002 Reserved for concepts with insufficient information to code with codable children: Secondary | ICD-10-CM | POA: Diagnosis not present

## 2011-12-20 DIAGNOSIS — M5137 Other intervertebral disc degeneration, lumbosacral region: Secondary | ICD-10-CM | POA: Diagnosis not present

## 2011-12-20 DIAGNOSIS — M999 Biomechanical lesion, unspecified: Secondary | ICD-10-CM | POA: Diagnosis not present

## 2011-12-21 DIAGNOSIS — R05 Cough: Secondary | ICD-10-CM | POA: Diagnosis not present

## 2011-12-21 DIAGNOSIS — R0989 Other specified symptoms and signs involving the circulatory and respiratory systems: Secondary | ICD-10-CM | POA: Diagnosis not present

## 2011-12-21 DIAGNOSIS — L259 Unspecified contact dermatitis, unspecified cause: Secondary | ICD-10-CM | POA: Diagnosis not present

## 2011-12-21 DIAGNOSIS — J329 Chronic sinusitis, unspecified: Secondary | ICD-10-CM | POA: Diagnosis not present

## 2011-12-21 DIAGNOSIS — R0609 Other forms of dyspnea: Secondary | ICD-10-CM | POA: Diagnosis not present

## 2011-12-21 DIAGNOSIS — R0602 Shortness of breath: Secondary | ICD-10-CM | POA: Diagnosis not present

## 2011-12-21 DIAGNOSIS — R059 Cough, unspecified: Secondary | ICD-10-CM | POA: Diagnosis not present

## 2011-12-21 LAB — PULMONARY FUNCTION TEST

## 2011-12-22 ENCOUNTER — Telehealth: Payer: Self-pay | Admitting: Cardiovascular Disease

## 2011-12-22 NOTE — Telephone Encounter (Signed)
Please see below and advise. Thanks

## 2011-12-22 NOTE — Telephone Encounter (Signed)
Pt called and wants to know if she can get an echo ordered due to possibility of having esophagus surgery. Pt doesn't know if medicare will pay for it or not.

## 2011-12-25 ENCOUNTER — Other Ambulatory Visit: Payer: Self-pay

## 2011-12-25 MED ORDER — TORSEMIDE 10 MG PO TABS: ORAL_TABLET | ORAL | Status: DC

## 2011-12-25 NOTE — Telephone Encounter (Signed)
Refill sent for Torsemide 10 mg take two tablet twice a day per patient for a 90 day supply.

## 2011-12-26 ENCOUNTER — Telehealth: Payer: Self-pay | Admitting: Family Medicine

## 2011-12-26 DIAGNOSIS — M25579 Pain in unspecified ankle and joints of unspecified foot: Secondary | ICD-10-CM | POA: Diagnosis not present

## 2011-12-26 DIAGNOSIS — G5760 Lesion of plantar nerve, unspecified lower limb: Secondary | ICD-10-CM | POA: Diagnosis not present

## 2011-12-26 DIAGNOSIS — I5023 Acute on chronic systolic (congestive) heart failure: Secondary | ICD-10-CM

## 2011-12-26 NOTE — Telephone Encounter (Signed)
Caller: Ashley [name]/Patient; PCP: [name];; Call regarding Legs hurting, has episodes where Sodium gets too low. Feels like has the flu, legs feel achy and she feels exhausted. Needing bloodwork; Takes sodium based on weight. #153. Took 1 1/2 tabs of Sodium and took 3 KCL today 12/26/11. Will take another 1 1/2 tabs Sodium after dinner. BP after exercise 99/52- Normally 100/72. Pulse =90's. Ankles slightly puffy- no worse than normal. Woke up last night and knees hurting. Triage and Care advice per Leg Non-Injury Protocol and appnt advised within 24 hours. SHE IS WONDERING IF SHE CAN JUST COME FOR LABS TOMORROW OR IF DR. GUTIERREZ CAN FIT HER IN. PLEASE CALL BACK CB#: [phone].

## 2011-12-26 NOTE — Telephone Encounter (Signed)
May come in for [facility] tomorrow (to check potassium), may place at 1215 if pt desires, o/w just come in for labs.

## 2011-12-26 NOTE — Telephone Encounter (Signed)
Caller: Jalaine/Patient; Patient Name: Ashley [name]; PCP: [name]; Best Callback Phone Number: [phone]. States that she called earlier requesting that Dr. Sharen Hones be asked if she could have blood work done without an appointment. "I have had low Potassium before and feel the same way I did when it was low and just want my blood work done." "Nothing has changed in my medical history, I just want to know what he said." Verified in EPIC that Dr. Oren Section authorized for patient to come in at 1215 for lab work only, no appointment. Advised patient of this and states that she needs to come first thing in the morning. Called back line of office and spoke with Jamesetta So, scheduled patient for 0805 for lab work only. Patient agrees.

## 2011-12-27 ENCOUNTER — Other Ambulatory Visit: Payer: Self-pay | Admitting: Family Medicine

## 2011-12-27 ENCOUNTER — Telehealth: Payer: Self-pay | Admitting: Cardiovascular Disease

## 2011-12-27 ENCOUNTER — Other Ambulatory Visit (INDEPENDENT_AMBULATORY_CARE_PROVIDER_SITE_OTHER): Payer: Medicare Other

## 2011-12-27 ENCOUNTER — Telehealth: Payer: Self-pay | Admitting: Family Medicine

## 2011-12-27 DIAGNOSIS — I5023 Acute on chronic systolic (congestive) heart failure: Secondary | ICD-10-CM | POA: Diagnosis not present

## 2011-12-27 DIAGNOSIS — IMO0002 Reserved for concepts with insufficient information to code with codable children: Secondary | ICD-10-CM | POA: Diagnosis not present

## 2011-12-27 DIAGNOSIS — M5137 Other intervertebral disc degeneration, lumbosacral region: Secondary | ICD-10-CM | POA: Diagnosis not present

## 2011-12-27 DIAGNOSIS — I509 Heart failure, unspecified: Secondary | ICD-10-CM

## 2011-12-27 DIAGNOSIS — M999 Biomechanical lesion, unspecified: Secondary | ICD-10-CM | POA: Diagnosis not present

## 2011-12-27 LAB — BASIC METABOLIC PANEL
BUN: 41 mg/dL — ABNORMAL HIGH (ref 6–23)
CO2: 32 mEq/L (ref 19–32)
Calcium: 10 mg/dL (ref 8.4–10.5)
Chloride: 98 mEq/L (ref 96–112)
Creatinine, Ser: 1.1 mg/dL (ref 0.4–1.2)
GFR: 52.2 mL/min — ABNORMAL LOW (ref >60.00)
Glucose, Bld: 101 mg/dL — ABNORMAL HIGH (ref 70–99)
Potassium: 3.1 mEq/L — ABNORMAL LOW (ref 3.5–5.1)
Sodium: 143 mEq/L (ref 135–145)

## 2011-12-27 MED ORDER — POTASSIUM CHLORIDE ER 10 MEQ PO TBCR: 10.0000 meq | EXTENDED_RELEASE_TABLET | Freq: Every day | ORAL | Status: DC | PRN

## 2011-12-27 NOTE — Telephone Encounter (Signed)
Pt calls for appt with Dr Mariah Milling regarding: 1. Low potassium and medication changes made by pcp today in regards to K+3.1.  Labs in EPIC 2. Surgical clearance for esophageal surgery-- at present not scheduled. Appt made. Mylo Red RN

## 2011-12-27 NOTE — Telephone Encounter (Signed)
Error

## 2011-12-27 NOTE — Telephone Encounter (Signed)
See yesterdays note

## 2011-12-27 NOTE — Telephone Encounter (Signed)
Triage Record Num: [id number] Operator: Valene Bors Patient Name: [facility] Call Date & Time: 12/26/2011 3:23:15PM Patient Phone: [phone] PCP: [name] Patient Gender: Female PCP Fax : [phone] Patient DOB: Apr 19, 1946 Practice Name: [facility] Reason for Call: Caller: Anai/Patient; PCP: [name];; Call regarding Legs hurting, has episodes where Sodium gets too low. Feels like has the flu, legs feel achy and she feels exhausted. Needing bloodwork; Takes sodium based on weight. #153. Took 1 1/2 tabs of Sodium and took 3 KCL today 12/26/11. Will take another 1 1/2 tabs Sodium after dinner. BP after exercise 99/52- Normally 100/72. Pulse =90's. Ankles slightly puffy- no worse than normal. Woke up last night and knees hurting. Triage and Care advice per Leg Non-Injury Protocol and appnt advised within 24 hours. SHE IS WONDERING IF SHE CAN JUST COME FOR LABS TOMORROW OR IF DR. GUTIERREZ CAN FIT HER IN. PLEASE CALL BACK CB#: [phone]. Protocol(s) Used: Leg Non-Injury Recommended Outcome per Protocol: See Provider within 24 hours Reason for Outcome: New onset mild to moderate pain that has not improved with 24 hours of home care Care Advice: ~ Call provider if symptoms worsen or new symptoms develop. ~ SYMPTOM / CONDITION MANAGEMENT Position affected part so it is elevated at least 12 inches (30 cm) above level of heart to improve circulation and decrease discomfort. ~ Analgesic/Antipyretic Advice - Acetaminophen: Consider acetaminophen as directed on label or by pharmacist/provider for pain or fever PRECAUTIONS: - Use if there is no history of liver disease, alcoholism, or intake of three or more alcohol drinks per day - Only if approved by provider during pregnancy or when breastfeeding - During pregnancy, acetaminophen should not be taken more than 3 consecutive days without telling provider - Do not exceed recommended dose or  frequency ~ 12/26/2011 4:13:23PM Page 1 of 1 [id number]

## 2011-12-27 NOTE — Telephone Encounter (Signed)
The changes made sound very good to me She will likely need recheck of lab work in several weeks time

## 2011-12-27 NOTE — Telephone Encounter (Signed)
Pt calling states that she she had labs today at PCP and was told that she needs to have her meds adjusted.

## 2011-12-29 ENCOUNTER — Ambulatory Visit: Payer: Medicare Other | Admitting: Cardiovascular Disease

## 2011-12-29 DIAGNOSIS — R05 Cough: Secondary | ICD-10-CM | POA: Diagnosis not present

## 2011-12-29 DIAGNOSIS — R059 Cough, unspecified: Secondary | ICD-10-CM | POA: Diagnosis not present

## 2011-12-29 DIAGNOSIS — K219 Gastro-esophageal reflux disease without esophagitis: Secondary | ICD-10-CM | POA: Diagnosis not present

## 2011-12-29 DIAGNOSIS — G250 Essential tremor: Secondary | ICD-10-CM | POA: Insufficient documentation

## 2011-12-29 DIAGNOSIS — R490 Dysphonia: Secondary | ICD-10-CM | POA: Diagnosis not present

## 2012-01-02 NOTE — Telephone Encounter (Signed)
Her EF is 30% by myoview. I am not sure a 2D echo will provide me with any additional relevant information. Dr. Lewie Loron is her primary cardiologist I think.

## 2012-01-03 ENCOUNTER — Other Ambulatory Visit: Payer: Self-pay | Admitting: *Deleted

## 2012-01-03 DIAGNOSIS — IMO0002 Reserved for concepts with insufficient information to code with codable children: Secondary | ICD-10-CM | POA: Diagnosis not present

## 2012-01-03 DIAGNOSIS — M5137 Other intervertebral disc degeneration, lumbosacral region: Secondary | ICD-10-CM | POA: Diagnosis not present

## 2012-01-03 DIAGNOSIS — M999 Biomechanical lesion, unspecified: Secondary | ICD-10-CM | POA: Diagnosis not present

## 2012-01-03 MED ORDER — ALPRAZOLAM 0.25 MG PO TABS: 0.2500 mg | ORAL_TABLET | Freq: Two times a day (BID) | ORAL | Status: DC

## 2012-01-03 NOTE — Telephone Encounter (Signed)
Faxed refill request from [facility] [address] 

## 2012-01-03 NOTE — Telephone Encounter (Signed)
Has appt with Dr. Mariah Milling 8/22. Will address at this time

## 2012-01-03 NOTE — Telephone Encounter (Signed)
Medicine called to pharmacy. 

## 2012-01-03 NOTE — Telephone Encounter (Signed)
plz phone in. 

## 2012-01-04 ENCOUNTER — Ambulatory Visit (INDEPENDENT_AMBULATORY_CARE_PROVIDER_SITE_OTHER): Payer: Medicare Other | Admitting: Cardiovascular Disease

## 2012-01-04 ENCOUNTER — Encounter: Payer: Self-pay | Admitting: Cardiovascular Disease

## 2012-01-04 VITALS — BP 102/62 | HR 88 | Ht 65.5 in | Wt 151.0 lb

## 2012-01-04 DIAGNOSIS — R0602 Shortness of breath: Secondary | ICD-10-CM | POA: Diagnosis not present

## 2012-01-04 DIAGNOSIS — I059 Rheumatic mitral valve disease, unspecified: Secondary | ICD-10-CM | POA: Diagnosis not present

## 2012-01-04 DIAGNOSIS — R059 Cough, unspecified: Secondary | ICD-10-CM

## 2012-01-04 DIAGNOSIS — R05 Cough: Secondary | ICD-10-CM

## 2012-01-04 DIAGNOSIS — R079 Chest pain, unspecified: Secondary | ICD-10-CM | POA: Diagnosis not present

## 2012-01-04 DIAGNOSIS — E876 Hypokalemia: Secondary | ICD-10-CM

## 2012-01-04 DIAGNOSIS — I340 Nonrheumatic mitral (valve) insufficiency: Secondary | ICD-10-CM

## 2012-01-04 DIAGNOSIS — I5023 Acute on chronic systolic (congestive) heart failure: Secondary | ICD-10-CM

## 2012-01-04 DIAGNOSIS — I509 Heart failure, unspecified: Secondary | ICD-10-CM

## 2012-01-04 MED ORDER — CARVEDILOL 12.5 MG PO TABS: 12.5000 mg | ORAL_TABLET | Freq: Two times a day (BID) | ORAL | Status: DC

## 2012-01-04 MED ORDER — SPIRONOLACTONE 25 MG PO TABS: 25.0000 mg | ORAL_TABLET | Freq: Every day | ORAL | Status: DC

## 2012-01-04 NOTE — Assessment & Plan Note (Signed)
We will add spironolactone 25 mg daily. Repeat blood work in several weeks.

## 2012-01-04 NOTE — Assessment & Plan Note (Signed)
Likely secondary to distal esophageal  abnormal GERD. She is planning possible corrective surgery later this fall.

## 2012-01-04 NOTE — Assessment & Plan Note (Signed)
Moderate MR seen on previous echocardiogram in 2012.

## 2012-01-04 NOTE — Assessment & Plan Note (Signed)
We have suggested she continue to maintain weight less than 130 pounds. We'll add spironolactone for hypokalemia. Repeat blood work in several weeks. She is requesting repeat echocardiogram to evaluate her valve, cardiac function, pulmonary pressures prior to esophageal surgery.

## 2012-01-04 NOTE — Patient Instructions (Addendum)
You are doing well. Please start spironolactone one a day ( to preserve potassium)  Please increase the coreg to 12.5 mg in the AM and 6.25 mg in the PM  We will schedule you for an echocardiogram for mitral valve regurg and cardiomyopathy  Please call us if you have new issues that need to be addressed before your next appt.  Your physician wants you to follow-up in: 3 months.  You will receive a reminder letter in the mail two months in advance. If you don't receive a letter, please call our office to schedule the follow-up appointment.

## 2012-01-04 NOTE — Progress Notes (Signed)
Patient ID: Ashley [name], female    DOB: 09-17-1945, 66 y.o.   MRN: [id number]  HPI Comments: 66 y/o female with history of pneumonia, chronic cough, diagnosis of COPD made during a hospital course at [facility] (abnormal PFTs),  shortness of breath dating back to September 2012 that presented acutely on September 1, CT scan showing progressive pleural effusion on a repeat scan , echo showing Cardiomyopathy  ejection fraction estimated at 25-35%,  Previous  Lexiscan Myoview  showed no significant ischemia, no EKG changes concerning for ischemia.   Follow up admission to [facility] in December 2012 for bronchitis and hypoxia as well as systolic CHF. She had antibiotics, prednisone and diuresis and has felt better since discharge. ACE inhibitor is on hold from several months ago secondary to chronic cough.  S/P ICD 10 days ago for EF <35%. She has follow up with EP later this week.   Since her last clinic visit, her weight has been relatively stable. Goal weight 130 pounds or less. She has had problems with potassium and she reports that she does not feel well when her potassium is low. She has been taking higher dose potassium twice a day in the past week since her last lab work. She has had significant workup at [facility] for esophageal problems found to have severe reflux disease, normal esophageal motility. She reports that she has been offered surgery to fix her distal esophageal acid exposure problem. This would likely explain her chronic cough and possible pneumonia in the past.   Continues to have shortness of breath. No significant edema. Some chest tightness.  Echocardiogram on May 02 2011 showed moderate pulmonary hypertension, ejection fraction 25%.  Cardiac MRI showed ejection fraction 34% ICD placed earlier this year by Dr. Ladona Ridgel.  EKG shows normal sinus rhythm with rate 88 beats per minute with nonspecific ST abnormality      Outpatient Encounter Prescriptions as of  01/04/2012  Medication Sig Dispense Refill  . ALPRAZolam (XANAX) 0.25 MG tablet Take 1 tablet (0.25 mg total) by mouth 2 (two) times daily.  60 tablet  0  . aspirin 81 MG EC tablet Take 81 mg by mouth at bedtime.       Marland Kitchen CALCIUM-VITAMIN D PO Take 1 capsule by mouth daily.      . carvedilol (COREG) 12.5 MG tablet Take 1 tablet (12.5 mg total) by mouth 2 (two) times daily.  180 tablet  3  . dextromethorphan (DELSYM) 30 MG/5ML liquid Take 60 mg by mouth every 12 (twelve) hours as needed. For cough/congestion.      . hydrALAZINE (APRESOLINE) 25 MG tablet Takes 0.5 tablet daily.      Marland Kitchen LIPITOR 10 MG tablet TAKE 1 TABLET EVERY DAY  30 tablet  11  . metolazone (ZAROXOLYN) 2.5 MG tablet Take 1 tablet (2.5 mg total) by mouth every other day. Take one tablet every other day before torsemide  45 tablet  1  . mometasone (NASONEX) 50 MCG/ACT nasal spray Place 2 sprays into the nose 2 (two) times daily.      Marland Kitchen omeprazole (PRILOSEC) 40 MG capsule Take 40 mg by mouth 2 (two) times daily.      . ondansetron (ZOFRAN) 4 MG tablet Take 4 mg by mouth as needed.       . potassium chloride (K-DUR) 10 MEQ tablet Take 1-4 tablets (10-40 mEq total) by mouth daily as needed. for every 10mg  toresmide  120 tablet  11  . torsemide (DEMADEX)  10 MG tablet Take two tablets twice a day.  360 tablet  3  . triamcinolone cream (KENALOG) 0.1 %       . vitamin B-12 (CYANOCOBALAMIN) 500 MCG tablet Take 1 tablet (500 mcg total) by mouth daily.      . vitamin C (ASCORBIC ACID) 500 MG tablet Take 500 mg by mouth 2 (two) times daily.       Marland Kitchen zolpidem (AMBIEN) 5 MG tablet Take 2.5-5 mg by mouth at bedtime as needed. For insomnia       . DISCONTD: carvedilol (COREG) 12.5 MG tablet Take 0.5 tablets (6.25 mg total) by mouth 2 (two) times daily.  180 tablet  3  . spironolactone (ALDACTONE) 25 MG tablet Take 1 tablet (25 mg total) by mouth daily.  90 tablet  3    Review of Systems  Constitutional: Positive for fatigue.       On 2 L  nasal cannula oxygen  HENT: Negative.   Eyes: Negative.   Respiratory: Positive for shortness of breath.   Cardiovascular: Positive for chest pain.  Gastrointestinal: Negative.   Musculoskeletal: Negative.   Skin: Negative.   Neurological: Negative.   Hematological: Negative.   Psychiatric/Behavioral: Negative.   All other systems reviewed and are negative.     BP 102/62  Pulse 88  Ht 5' 5.5" (1.664 m)  Wt 151 lb (68.493 kg)  BMI 24.75 kg/m2  Physical Exam  Nursing note and vitals reviewed. Constitutional: She is oriented to person, place, and time. She appears well-developed and well-nourished.  HENT:  Head: Normocephalic.  Nose: Nose normal.  Mouth/Throat: Oropharynx is clear and moist.  Eyes: Conjunctivae are normal. Pupils are equal, round, and reactive to light.  Neck: Normal range of motion. Neck supple. No JVD present.  Cardiovascular: Normal rate, regular rhythm, S1 normal, S2 normal, normal heart sounds and intact distal pulses.  Exam reveals no gallop and no friction rub.   No murmur heard. Pulmonary/Chest: Effort normal and breath sounds normal. No respiratory distress. She has no wheezes. She has no rales. She exhibits no tenderness.  Abdominal: Soft. Bowel sounds are normal. She exhibits no distension. There is no tenderness.  Musculoskeletal: Normal range of motion. She exhibits no edema and no tenderness.  Lymphadenopathy:    She has no cervical adenopathy.  Neurological: She is alert and oriented to person, place, and time. Coordination normal.  Skin: Skin is warm and dry. No rash noted. No erythema.  Psychiatric: She has a normal mood and affect. Her behavior is normal. Judgment and thought content normal.         Assessment and Plan

## 2012-01-16 ENCOUNTER — Other Ambulatory Visit (INDEPENDENT_AMBULATORY_CARE_PROVIDER_SITE_OTHER): Payer: Medicare Other

## 2012-01-16 ENCOUNTER — Other Ambulatory Visit: Payer: Self-pay

## 2012-01-16 DIAGNOSIS — I340 Nonrheumatic mitral (valve) insufficiency: Secondary | ICD-10-CM

## 2012-01-16 DIAGNOSIS — E876 Hypokalemia: Secondary | ICD-10-CM

## 2012-01-16 DIAGNOSIS — R0602 Shortness of breath: Secondary | ICD-10-CM

## 2012-01-16 DIAGNOSIS — R0609 Other forms of dyspnea: Secondary | ICD-10-CM | POA: Diagnosis not present

## 2012-01-16 DIAGNOSIS — R079 Chest pain, unspecified: Secondary | ICD-10-CM

## 2012-01-16 DIAGNOSIS — R0989 Other specified symptoms and signs involving the circulatory and respiratory systems: Secondary | ICD-10-CM

## 2012-01-17 DIAGNOSIS — Z01419 Encounter for gynecological examination (general) (routine) without abnormal findings: Secondary | ICD-10-CM | POA: Diagnosis not present

## 2012-01-17 DIAGNOSIS — M5137 Other intervertebral disc degeneration, lumbosacral region: Secondary | ICD-10-CM | POA: Diagnosis not present

## 2012-01-17 DIAGNOSIS — N951 Menopausal and female climacteric states: Secondary | ICD-10-CM | POA: Diagnosis not present

## 2012-01-17 DIAGNOSIS — Z124 Encounter for screening for malignant neoplasm of cervix: Secondary | ICD-10-CM | POA: Diagnosis not present

## 2012-01-17 DIAGNOSIS — IMO0002 Reserved for concepts with insufficient information to code with codable children: Secondary | ICD-10-CM | POA: Diagnosis not present

## 2012-01-17 DIAGNOSIS — M999 Biomechanical lesion, unspecified: Secondary | ICD-10-CM | POA: Diagnosis not present

## 2012-01-17 LAB — BASIC METABOLIC PANEL
BUN/Creatinine Ratio: 29 — ABNORMAL HIGH (ref 11–26)
BUN: 39 mg/dL — ABNORMAL HIGH (ref 8–27)
CO2: 24 mmol/L (ref 19–28)
Calcium: 10.4 mg/dL — ABNORMAL HIGH (ref 8.6–10.2)
Chloride: 98 mmol/L (ref 97–108)
Creatinine, Ser: 1.35 mg/dL — ABNORMAL HIGH (ref 0.57–1.00)
GFR calc Af Amer: 47 mL/min/{1.73_m2} — ABNORMAL LOW (ref >59)
GFR calc non Af Amer: 41 mL/min/{1.73_m2} — ABNORMAL LOW (ref >59)
Glucose: 73 mg/dL (ref 65–99)
Potassium: 4.9 mmol/L (ref 3.5–5.2)
Sodium: 139 mmol/L (ref 134–144)

## 2012-01-18 ENCOUNTER — Encounter: Payer: Self-pay | Admitting: Internal Medicine

## 2012-01-18 ENCOUNTER — Other Ambulatory Visit: Payer: Self-pay

## 2012-01-18 DIAGNOSIS — I509 Heart failure, unspecified: Secondary | ICD-10-CM

## 2012-01-18 NOTE — Progress Notes (Signed)
Since seeing Dr Juanda Chance on 03/31/11, patient has been seen at [facility] and had a procedure on 12/12/11 which is considered a change of care. She will need to follow up with [facility].

## 2012-01-23 ENCOUNTER — Encounter: Payer: Self-pay | Admitting: Pulmonary Disease

## 2012-01-24 ENCOUNTER — Telehealth: Payer: Self-pay | Admitting: Cardiovascular Disease

## 2012-01-24 DIAGNOSIS — H903 Sensorineural hearing loss, bilateral: Secondary | ICD-10-CM | POA: Diagnosis not present

## 2012-01-24 DIAGNOSIS — H9209 Otalgia, unspecified ear: Secondary | ICD-10-CM | POA: Diagnosis not present

## 2012-01-24 DIAGNOSIS — H905 Unspecified sensorineural hearing loss: Secondary | ICD-10-CM | POA: Diagnosis not present

## 2012-01-24 DIAGNOSIS — H6120 Impacted cerumen, unspecified ear: Secondary | ICD-10-CM | POA: Diagnosis not present

## 2012-01-24 DIAGNOSIS — J320 Chronic maxillary sinusitis: Secondary | ICD-10-CM | POA: Diagnosis not present

## 2012-01-24 NOTE — Telephone Encounter (Signed)
Pt calling states that her weight is up 3 lbs and wants to know if she needs to restart her lasix.

## 2012-01-24 NOTE — Telephone Encounter (Signed)
Pt says weight is up to 154.5 pounds. Was 150 pounds last week.  She asks if ok to take her PRN torsemide.  I advised ok to take torsemide PRN weight gain of 3-4 pounds over a 2-3 day period.  She is worried about her potassium becoming too low and asks if she needs to take extra potassium.  I explained, since she is taking spironolactone, this will help keep potassium in system and should prevent hypokalemia.  She then tells me she stopped the spironolactone on her own after I called her last week with BMP results which demonstrated high potassium.  At that time, Dr. Mariah Milling instructed pt to stop taking EXTRA potassium and felt elevated K level was d/t K load given to pt a few weeks earlier.  He instructed her to take PRN torsemide without extra potassium at that time.He did not advise her to stop spironolactone, but wanted her to remain on this.  I explained to pt she should have stayed on the spironolactone and is probably why she has weight gain associated with cough. I advised her to not take extra torsemide but to resume the spironolactone and continue to weigh herself daily. If weight does not return to baseline she should take torsemide PRN weight gain.  She verb. Understanding and will call us back should weight remain elevated with associated cough.

## 2012-01-25 ENCOUNTER — Encounter: Payer: Medicare Other | Admitting: *Deleted

## 2012-01-25 DIAGNOSIS — M5137 Other intervertebral disc degeneration, lumbosacral region: Secondary | ICD-10-CM | POA: Diagnosis not present

## 2012-01-25 DIAGNOSIS — IMO0002 Reserved for concepts with insufficient information to code with codable children: Secondary | ICD-10-CM | POA: Diagnosis not present

## 2012-01-25 DIAGNOSIS — M999 Biomechanical lesion, unspecified: Secondary | ICD-10-CM | POA: Diagnosis not present

## 2012-01-26 DIAGNOSIS — Z1231 Encounter for screening mammogram for malignant neoplasm of breast: Secondary | ICD-10-CM | POA: Diagnosis not present

## 2012-01-29 ENCOUNTER — Encounter: Payer: Self-pay | Admitting: Family Medicine

## 2012-01-29 ENCOUNTER — Other Ambulatory Visit: Payer: Self-pay | Admitting: *Deleted

## 2012-01-29 MED ORDER — ALPRAZOLAM 0.25 MG PO TABS: 0.2500 mg | ORAL_TABLET | Freq: Two times a day (BID) | ORAL | Status: DC

## 2012-01-29 NOTE — Telephone Encounter (Signed)
OK to refill

## 2012-01-29 NOTE — Telephone Encounter (Signed)
plz phone in. 

## 2012-01-30 ENCOUNTER — Encounter: Payer: Self-pay | Admitting: *Deleted

## 2012-01-30 NOTE — Telephone Encounter (Signed)
Rx called in as directed.   

## 2012-01-31 DIAGNOSIS — M999 Biomechanical lesion, unspecified: Secondary | ICD-10-CM | POA: Diagnosis not present

## 2012-01-31 DIAGNOSIS — IMO0002 Reserved for concepts with insufficient information to code with codable children: Secondary | ICD-10-CM | POA: Diagnosis not present

## 2012-01-31 DIAGNOSIS — M5137 Other intervertebral disc degeneration, lumbosacral region: Secondary | ICD-10-CM | POA: Diagnosis not present

## 2012-02-05 ENCOUNTER — Other Ambulatory Visit (INDEPENDENT_AMBULATORY_CARE_PROVIDER_SITE_OTHER): Payer: Medicare Other

## 2012-02-05 DIAGNOSIS — I509 Heart failure, unspecified: Secondary | ICD-10-CM

## 2012-02-06 ENCOUNTER — Ambulatory Visit: Payer: Medicare Other | Admitting: Internal Medicine

## 2012-02-06 LAB — BASIC METABOLIC PANEL
BUN/Creatinine Ratio: 25 (ref 11–26)
BUN: 29 mg/dL — ABNORMAL HIGH (ref 8–27)
CO2: 18 mmol/L — ABNORMAL LOW (ref 19–28)
Calcium: 10.2 mg/dL (ref 8.6–10.2)
Chloride: 100 mmol/L (ref 97–108)
Creatinine, Ser: 1.15 mg/dL — ABNORMAL HIGH (ref 0.57–1.00)
GFR calc Af Amer: 57 mL/min/{1.73_m2} — ABNORMAL LOW (ref >59)
GFR calc non Af Amer: 50 mL/min/{1.73_m2} — ABNORMAL LOW (ref >59)
Glucose: 104 mg/dL — ABNORMAL HIGH (ref 65–99)
Potassium: 4.2 mmol/L (ref 3.5–5.2)
Sodium: 141 mmol/L (ref 134–144)

## 2012-02-07 DIAGNOSIS — J320 Chronic maxillary sinusitis: Secondary | ICD-10-CM | POA: Diagnosis not present

## 2012-02-07 DIAGNOSIS — M5137 Other intervertebral disc degeneration, lumbosacral region: Secondary | ICD-10-CM | POA: Diagnosis not present

## 2012-02-07 DIAGNOSIS — M999 Biomechanical lesion, unspecified: Secondary | ICD-10-CM | POA: Diagnosis not present

## 2012-02-07 DIAGNOSIS — IMO0002 Reserved for concepts with insufficient information to code with codable children: Secondary | ICD-10-CM | POA: Diagnosis not present

## 2012-02-09 ENCOUNTER — Encounter: Payer: Self-pay | Admitting: *Deleted

## 2012-02-09 NOTE — Telephone Encounter (Signed)
Will you please review most recent labs and let me know what she needs to do, if anything, with potassium? Thanks!

## 2012-02-09 NOTE — Telephone Encounter (Signed)
Pt calling stating that she needs to know what her potassium dose needs to be she is almost out and needs to know what to refill.

## 2012-02-11 ENCOUNTER — Other Ambulatory Visit: Payer: Self-pay | Admitting: Cardiovascular Disease

## 2012-02-12 ENCOUNTER — Other Ambulatory Visit: Payer: Self-pay | Admitting: *Deleted

## 2012-02-12 ENCOUNTER — Telehealth: Payer: Self-pay

## 2012-02-12 MED ORDER — METOLAZONE 2.5 MG PO TABS: 2.5000 mg | ORAL_TABLET | ORAL | Status: DC

## 2012-02-12 NOTE — Telephone Encounter (Signed)
Pt calling again re: BMP results She says she is taking meds as follows: Torsemide x 2 qd, if weight greater than 150 pounds Spironolactone qd Metolazone QOD PRN weight gain Potassium x2  Qd  I told her i would share with Dr. Mariah Milling and have him review labs and call her back. Understanding verb.

## 2012-02-12 NOTE — Telephone Encounter (Signed)
Labs are good. If she needs, could take extra diuretic for SOB otherwise continue what she is doing

## 2012-02-12 NOTE — Telephone Encounter (Signed)
Refilled Metolazone. 

## 2012-02-13 ENCOUNTER — Other Ambulatory Visit: Payer: Self-pay | Admitting: Cardiovascular Disease

## 2012-02-13 NOTE — Telephone Encounter (Signed)
Spoke with pt and she mentioned that she has already picked up medication. Metolazone does not need to be refilled sent in on Sept. 30,2013.

## 2012-02-13 NOTE — Telephone Encounter (Signed)
Pt informed. Understanding verb Confirmed appt for 11/22

## 2012-02-14 ENCOUNTER — Encounter: Payer: Self-pay | Admitting: Pulmonary Disease

## 2012-02-14 ENCOUNTER — Telehealth: Payer: Self-pay | Admitting: Internal Medicine

## 2012-02-14 DIAGNOSIS — IMO0002 Reserved for concepts with insufficient information to code with codable children: Secondary | ICD-10-CM | POA: Diagnosis not present

## 2012-02-14 DIAGNOSIS — M5137 Other intervertebral disc degeneration, lumbosacral region: Secondary | ICD-10-CM | POA: Diagnosis not present

## 2012-02-14 DIAGNOSIS — M999 Biomechanical lesion, unspecified: Secondary | ICD-10-CM | POA: Diagnosis not present

## 2012-02-14 NOTE — Telephone Encounter (Signed)
Error

## 2012-02-14 NOTE — Telephone Encounter (Signed)
Notes are awaiting Dr Regino Schultze review. Someone will contact patient once Dr Juanda Chance makes a decision.

## 2012-02-15 ENCOUNTER — Telehealth: Payer: Self-pay

## 2012-02-15 NOTE — Telephone Encounter (Signed)
Pt left v/m wanting order for lipid lab work; pt wants to stop cholesterol med. I called pt and she already has appt tomorrow for foot problem and pt said she will come fasting in case Dr Reece Agar will do labs tomorrow. Pt said to disregard call.

## 2012-02-16 ENCOUNTER — Ambulatory Visit (INDEPENDENT_AMBULATORY_CARE_PROVIDER_SITE_OTHER): Payer: Medicare Other | Admitting: Family Medicine

## 2012-02-16 ENCOUNTER — Encounter: Payer: Self-pay | Admitting: Family Medicine

## 2012-02-16 VITALS — BP 110/78 | HR 92 | Temp 98.1°F | Wt 149.0 lb

## 2012-02-16 DIAGNOSIS — Z23 Encounter for immunization: Secondary | ICD-10-CM | POA: Diagnosis not present

## 2012-02-16 DIAGNOSIS — R252 Cramp and spasm: Secondary | ICD-10-CM

## 2012-02-16 DIAGNOSIS — R209 Unspecified disturbances of skin sensation: Secondary | ICD-10-CM | POA: Diagnosis not present

## 2012-02-16 DIAGNOSIS — R202 Paresthesia of skin: Secondary | ICD-10-CM

## 2012-02-16 DIAGNOSIS — E7800 Pure hypercholesterolemia, unspecified: Secondary | ICD-10-CM | POA: Diagnosis not present

## 2012-02-16 LAB — CK: Total CK: 207 U/L — ABNORMAL HIGH (ref 7–177)

## 2012-02-16 LAB — BASIC METABOLIC PANEL
BUN: 37 mg/dL — ABNORMAL HIGH (ref 6–23)
CO2: 30 mEq/L (ref 19–32)
Calcium: 10.5 mg/dL (ref 8.4–10.5)
Chloride: 94 mEq/L — ABNORMAL LOW (ref 96–112)
Creatinine, Ser: 1.4 mg/dL — ABNORMAL HIGH (ref 0.4–1.2)
GFR: 38.95 mL/min — ABNORMAL LOW (ref >60.00)
Glucose, Bld: 101 mg/dL — ABNORMAL HIGH (ref 70–99)
Potassium: 3.6 mEq/L (ref 3.5–5.1)
Sodium: 137 mEq/L (ref 135–145)

## 2012-02-16 LAB — LIPID PANEL
Cholesterol: 218 mg/dL — ABNORMAL HIGH (ref 0–200)
HDL: 55.3 mg/dL (ref >39.00)
Total CHOL/HDL Ratio: 4
Triglycerides: 173 mg/dL — ABNORMAL HIGH (ref 0.0–149.0)
VLDL: 34.6 mg/dL (ref 0.0–40.0)

## 2012-02-16 LAB — FOLATE: Folate: >24.8 ng/mL (ref >5.9)

## 2012-02-16 LAB — LDL CHOLESTEROL, DIRECT: Direct LDL: 131.5 mg/dL

## 2012-02-16 LAB — VITAMIN B12: Vitamin B-12: >1500 pg/mL — ABNORMAL HIGH (ref 211–911)

## 2012-02-16 NOTE — Patient Instructions (Addendum)
Pneumonia shot today. We have checked blood work today.  We will call you with results. Depending on results we will discuss plan.

## 2012-02-16 NOTE — Progress Notes (Signed)
  Subjective:    Patient ID: Ashley [name], female    DOB: 21-Aug-1945, 66 y.o.   MRN: [id number]  HPI CC: hand/foot cramps  Cramping for last several months bilateral hands and feet.  Mainly left foot - cramping described as starting at longitudinal arch and traveling to big toe.  H/o bunion, using pad between 1st and 2nd toes to prevent hallux valgus.  Cramping of left hand digits - had episode for <40min where all her digits cramped.  Since starting to use toe pad, cramping has worsened.  Endorses paresthesias of toes as well, sometimes more evident than foot/leg cramps. Lab Results  Component Value Date   K 4.2 02/05/2012   Wt Readings from Last 3 Encounters:  02/16/12 149 lb (67.586 kg)  01/04/12 151 lb (68.493 kg)  11/28/11 151 lb 12 oz (68.833 kg)   She is on lipitor 10mg  daily.  Unable to tolerate generic atorvastatin 2/2 worsening cramps.  Crestor didn't control lipids well.  Sees chiropractor once a week.  Seen at [facility] since seen by myself (referred by Dr. Kendrick Fries).  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  Recommended Nissen fundoplication.  Awaiting decision by GI Juanda Chance).  Does not get flu shots.   Has not had pneumonia shot in past.  States allergic to concentrated amount of eggs.  Advised shouldn't be a problem with pneumonia shot.  Planning on trip to [facility] Nov 8th 2013 with grandchildren.  Very excited about this. Planning on cruise to [place] January 2014.  Past Medical History  Diagnosis Date  . Migraine without aura, without mention of intractable migraine without mention of status migrainosus   . Diarrhea   . Diverticulosis of colon     no diverticulitis  . Pure hypercholesterolemia   . Irritable bowel syndrome   . Laryngeal nodule     Dr. Thelma Comp  . Fibrocystic breast disease   . Osteoarthritis   . Hemorrhoids   . Anxiety   . GERD (gastroesophageal reflux disease)   . Systolic CHF     EF 25-35%, LV dilation, significant MR, goal weight 150lbs  . Nonischemic  cardiomyopathy   . Pneumonia 2012  . Mitral regurgitation   . Anxiety   . Pleural effusion, bilateral 2012  . Bronchiectasis     mild, treat URIs aggressively  . Hypertension   . Heart murmur   . Angina   . Lung disease 07/14/11  . Sleep apnea   . Shortness of breath     "resting"  . Anemia   . Fibromyalgia   . Depression   . Disorder of vocal cords      Review of Systems Per HPI    Objective:   Physical Exam  Nursing note and vitals reviewed. Constitutional: She appears well-developed and well-nourished. No distress.  Cardiovascular: Normal rate, regular rhythm, normal heart sounds and intact distal pulses.   No murmur heard. Pulmonary/Chest: Effort normal and breath sounds normal. No respiratory distress. She has no wheezes. She has no rales.  Musculoskeletal: She exhibits no edema.       2+ DP/PT bilaterally No pain with palpation bilateral feet.  No skin changes.  Left foot with toe separator pad present Longitudinal and transverse arches intact No palpable cords.  No leg swelling. Hands without deformity or tenderness to palpation.  FROM at joints of hand and wrists.  Skin: Skin is warm and dry. No rash noted.       Assessment & Plan:

## 2012-02-18 DIAGNOSIS — R252 Cramp and spasm: Secondary | ICD-10-CM | POA: Insufficient documentation

## 2012-02-18 NOTE — Assessment & Plan Note (Signed)
Due for recheck. Will obtain FLP and then titrate statin accordingly.

## 2012-02-18 NOTE — Assessment & Plan Note (Signed)
Upper and lower extremity cramping, possibly associated with statin use. Has been on brand lipitor 10mg  and tolerated this in past - check FLP and then will decide on route to take. Check CK as well as vitamin levels given endorsing paresthesias. Consider change in statin vs trial of coQ10.

## 2012-02-19 ENCOUNTER — Telehealth: Payer: Self-pay | Admitting: Internal Medicine

## 2012-02-19 NOTE — Telephone Encounter (Signed)
plz return call to patient  [phone] regarding issues with remote transmission .

## 2012-02-19 NOTE — Telephone Encounter (Signed)
Left message for patient

## 2012-02-21 ENCOUNTER — Encounter: Payer: Self-pay | Admitting: Internal Medicine

## 2012-02-21 ENCOUNTER — Other Ambulatory Visit: Payer: Self-pay | Admitting: Family Medicine

## 2012-02-21 DIAGNOSIS — IMO0002 Reserved for concepts with insufficient information to code with codable children: Secondary | ICD-10-CM | POA: Diagnosis not present

## 2012-02-21 DIAGNOSIS — M5137 Other intervertebral disc degeneration, lumbosacral region: Secondary | ICD-10-CM | POA: Diagnosis not present

## 2012-02-21 DIAGNOSIS — M999 Biomechanical lesion, unspecified: Secondary | ICD-10-CM | POA: Diagnosis not present

## 2012-02-21 MED ORDER — COQ-10 150 MG PO CAPS: 1.0000 | ORAL_CAPSULE | Freq: Every day | ORAL | Status: DC

## 2012-02-21 MED ORDER — PRAVASTATIN SODIUM 80 MG PO TABS: 80.0000 mg | ORAL_TABLET | Freq: Every day | ORAL | Status: DC

## 2012-02-22 DIAGNOSIS — D2330 Other benign neoplasm of skin of unspecified part of face: Secondary | ICD-10-CM | POA: Diagnosis not present

## 2012-02-22 DIAGNOSIS — B079 Viral wart, unspecified: Secondary | ICD-10-CM | POA: Diagnosis not present

## 2012-02-22 DIAGNOSIS — L821 Other seborrheic keratosis: Secondary | ICD-10-CM | POA: Diagnosis not present

## 2012-02-22 DIAGNOSIS — B372 Candidiasis of skin and nail: Secondary | ICD-10-CM | POA: Diagnosis not present

## 2012-02-23 ENCOUNTER — Telehealth: Payer: Self-pay | Admitting: Cardiovascular Disease

## 2012-02-23 ENCOUNTER — Telehealth: Payer: Self-pay | Admitting: Internal Medicine

## 2012-02-23 NOTE — Telephone Encounter (Signed)
Able to travel fine with altitudes in [place] with her device

## 2012-02-23 NOTE — Telephone Encounter (Signed)
Spoke with patient and let her know she would be

## 2012-02-23 NOTE — Telephone Encounter (Signed)
See below and advise thanks 

## 2012-02-23 NOTE — Telephone Encounter (Signed)
plz return call to pt regarding questions about travel restrictions

## 2012-02-23 NOTE — Telephone Encounter (Signed)
Pt calling states that she is going to [place] and wants to know if she is ok to climb a mountain. Will call her pulmonary md also

## 2012-02-25 NOTE — Telephone Encounter (Signed)
Would make sure she is on enough diuretic before heavy activity Do not get too hydrated. Less sob when "dry"

## 2012-02-26 NOTE — Telephone Encounter (Signed)
LMTCB

## 2012-03-04 ENCOUNTER — Telehealth: Payer: Self-pay | Admitting: *Deleted

## 2012-03-04 NOTE — Telephone Encounter (Signed)
FYI Pt calls c/o arm pain, and weakness x 3 weeks starting after receive pneumonia vaccine and labs. Pt states that by that afternoon she could not move arm away from her body, and had soreness in upper arm when she moved her fingers. Still has tenderness in shoulder and whole arm, not able to lift arm over her head. Pt has tried aleve, cold, heat, massages, and chiro adjustments since and has had some improvement but arm is still weak.   I scheduled appt for eval tomorrow, she was not free to come in today.

## 2012-03-04 NOTE — Telephone Encounter (Signed)
Noted. will see then. Pneumovax provided 02/16/2012

## 2012-03-05 ENCOUNTER — Other Ambulatory Visit: Payer: Self-pay | Admitting: Family Medicine

## 2012-03-05 ENCOUNTER — Ambulatory Visit (INDEPENDENT_AMBULATORY_CARE_PROVIDER_SITE_OTHER): Payer: Medicare Other | Admitting: Family Medicine

## 2012-03-05 ENCOUNTER — Encounter: Payer: Self-pay | Admitting: Family Medicine

## 2012-03-05 VITALS — BP 122/76 | HR 84 | Temp 97.9°F | Wt 155.2 lb

## 2012-03-05 DIAGNOSIS — E559 Vitamin D deficiency, unspecified: Secondary | ICD-10-CM | POA: Diagnosis not present

## 2012-03-05 DIAGNOSIS — M79609 Pain in unspecified limb: Secondary | ICD-10-CM | POA: Diagnosis not present

## 2012-03-05 DIAGNOSIS — M79602 Pain in left arm: Secondary | ICD-10-CM

## 2012-03-05 MED ORDER — HYDROCODONE-ACETAMINOPHEN 5-500 MG PO TABS: 1.0000 | ORAL_TABLET | Freq: Three times a day (TID) | ORAL | Status: DC | PRN

## 2012-03-05 NOTE — Assessment & Plan Note (Signed)
Some difficulty pinpointing focal painful area. H/o L arm pain in past, but I do think currently may have biceps tendonitis exacerbation sxs, unsure if related to pneumovax although noted temporal relationship. I do think rest of body pains are fibromyalgia flare. In small chance that statin could be cause generalized body aches, recommended back down to QOD dosing until feeling better, then do another trial of statin QD.  Continue CoQ10. Recommended she treat as tendonitis with stretching exercises provided from [facility] pt advisor, discussed different options for analgesia including NSAIDs, tramadol, tylenol, and narcotics. We have opted to try ES tylenol 500mg  tid prn with vicodin for breakthrough pain.  Hesitant to use NSAIDs given sCHF and CRI. Continue ice/heat to shoulder/arm.

## 2012-03-05 NOTE — Telephone Encounter (Signed)
plz phone in. 

## 2012-03-05 NOTE — Patient Instructions (Addendum)
I wonder if you have biceps tendonitis that has caused flare of fibromyalgia.  Do stretching exercises provided as tolerated. Take tylenol 500mg  three times daily as needed.  May use hydrocodone, 1/2 tablet, for breakthrough pain Decrease pravastatin to every other day until feeling better, then we can try every day again.  May continue co enzyme Q10 daily. Let us know if not improving.  Good to see you today, call us with quesitons.  Continue heating pad alterating with cold compresses

## 2012-03-05 NOTE — Telephone Encounter (Signed)
Rx called in as directed.   

## 2012-03-05 NOTE — Progress Notes (Signed)
  Subjective:    Patient ID: Ashley [name], female    DOB: 05-20-1945, 66 y.o.   MRN: [id number]  HPI CC: Left arm pain/weakness  Pneumovax provided 02/16/2012.  By that afternoon, endorses pain and weakness of entire left arm.  Pain in arm from axilla to lateral arm.  Constant ache.  Also having palmar pain and medial (hypothenar) pain.  Treated with cold compresses, heat, aleve, had massage, saw chiropractor.  Taking 1 aleve bid, not relieving pain.  Wonders if now has gone into fibromyalgia flare.  No fevers/chills, no numbness.  No neck pain  Not able to exercise as she would like - trouble with any upper body exercises 2/2 continued pain and weakness of left arm now 3 wks after shot.  Pravastatin was recently started (in place of lipitor for concern for worsening arthralgias) - now all joints are aching.  Has been taking CoQ10.  Also on vit C and D.  Requests vit D to be checked with next blood work.  H/o deficiency in past.  Past Medical History  Diagnosis Date  . Migraine without aura, without mention of intractable migraine without mention of status migrainosus   . Diarrhea   . Diverticulosis of colon     no diverticulitis  . Pure hypercholesterolemia   . Irritable bowel syndrome   . Laryngeal nodule     Dr. Thelma Comp  . Fibrocystic breast disease   . Osteoarthritis   . Hemorrhoids   . Anxiety   . GERD (gastroesophageal reflux disease)   . Systolic CHF     EF 25-35%, LV dilation, significant MR, goal weight 150lbs  . Nonischemic cardiomyopathy   . Pneumonia 2012  . Mitral regurgitation   . Anxiety   . Pleural effusion, bilateral 2012  . Bronchiectasis     mild, treat URIs aggressively  . Hypertension   . Heart murmur   . Angina   . Lung disease 07/14/11  . Sleep apnea   . Shortness of breath     "resting"  . Anemia   . Fibromyalgia   . Depression   . Disorder of vocal cords     Review of Systems Per HPI     Objective:   Physical Exam  Nursing note and  vitals reviewed. Constitutional: She appears well-developed and well-nourished. No distress.  Musculoskeletal:       Diffusely tender to palpation bilateral forearms, arms shoulders and neck. Difficulty with pinpointing specific increase in pain, but does have positive speed and yergason, some tenderness with palpation of left biceps tendon as it runs along bicipital groove. + empty can bilaterally.  Neurological: She has normal strength. She displays no atrophy. No sensory deficit. She exhibits normal muscle tone. Coordination and gait normal.  Reflex Scores:      Bicep reflexes are 2+ on the right side and 2+ on the left side.      Brachioradialis reflexes are 2+ on the right side and 2+ on the left side.      Sensation intact to temp and light touch. 5/5 strength BUE       Assessment & Plan:

## 2012-03-11 ENCOUNTER — Telehealth: Payer: Self-pay

## 2012-03-11 MED ORDER — SCOPOLAMINE 1 MG/3DAYS TD PT72: 1.0000 | MEDICATED_PATCH | TRANSDERMAL | Status: DC

## 2012-03-11 NOTE — Telephone Encounter (Signed)
Pt traveling on 3 different trips in the near future for a total of 20 days of traveling and pt request transderm scope sent to [facility] [address].Please advise.

## 2012-03-11 NOTE — Telephone Encounter (Signed)
plz notify sent in. 

## 2012-03-12 NOTE — Telephone Encounter (Signed)
Patient notified

## 2012-03-13 DIAGNOSIS — H1030 Unspecified acute conjunctivitis, unspecified eye: Secondary | ICD-10-CM | POA: Diagnosis not present

## 2012-03-13 DIAGNOSIS — R51 Headache: Secondary | ICD-10-CM | POA: Diagnosis not present

## 2012-03-19 ENCOUNTER — Other Ambulatory Visit: Payer: Self-pay | Admitting: *Deleted

## 2012-03-19 MED ORDER — POTASSIUM CHLORIDE ER 10 MEQ PO TBCR: 10.0000 meq | EXTENDED_RELEASE_TABLET | Freq: Every day | ORAL | Status: DC | PRN

## 2012-03-21 ENCOUNTER — Telehealth: Payer: Self-pay

## 2012-03-21 ENCOUNTER — Encounter: Payer: Self-pay | Admitting: Family Medicine

## 2012-03-21 ENCOUNTER — Ambulatory Visit (INDEPENDENT_AMBULATORY_CARE_PROVIDER_SITE_OTHER): Payer: Medicare Other | Admitting: Family Medicine

## 2012-03-21 VITALS — BP 110/60 | HR 87 | Temp 98.1°F | Ht 65.5 in | Wt 154.8 lb

## 2012-03-21 DIAGNOSIS — N949 Unspecified condition associated with female genital organs and menstrual cycle: Secondary | ICD-10-CM | POA: Diagnosis not present

## 2012-03-21 DIAGNOSIS — R102 Pelvic and perineal pain: Secondary | ICD-10-CM | POA: Insufficient documentation

## 2012-03-21 DIAGNOSIS — R300 Dysuria: Secondary | ICD-10-CM

## 2012-03-21 DIAGNOSIS — J069 Acute upper respiratory infection, unspecified: Secondary | ICD-10-CM | POA: Diagnosis not present

## 2012-03-21 LAB — POCT UA - MICROSCOPIC ONLY: Casts, Ur, LPF, POC: NEGATIVE

## 2012-03-21 LAB — POCT URINALYSIS DIPSTICK
Bilirubin, UA: NEGATIVE
Glucose, UA: NEGATIVE
Ketones, UA: NEGATIVE
Nitrite, UA: NEGATIVE
Protein, UA: NEGATIVE
Spec Grav, UA: 1.01
Urobilinogen, UA: NEGATIVE
pH, UA: 5

## 2012-03-21 NOTE — Telephone Encounter (Signed)
Pt saw Dr Ermalene Searing this morning and forgot to ask for [employer]'s magic mouthwash to go to [facility]. Pt is going to [place] on 03-23-12 and wants to take with her.Please advise.

## 2012-03-21 NOTE — Patient Instructions (Addendum)
Continue nasonex daily. Restart nasal saline irrigation or spray... 2-3 times a day. Change delsym to mucinex DM ( no decongestant). Call if not turning the corner by day 7-10 of illness.

## 2012-03-21 NOTE — Telephone Encounter (Signed)
i saw no evidence of thrush... Is she using this for sore throat pain? Get exact ingredients and ratios and okay to fill.

## 2012-03-21 NOTE — Progress Notes (Signed)
  Subjective:    Patient ID: Ashley [name], female    DOB: 1945-10-26, 66 y.o.   MRN: [id number]  URI  This is a new problem. The current episode started more than 1 month ago (Symtpoms atarted 6 weeks ago following pneumonia shot, arm was tender.. now 95% better.  also had pink eye in the interim.  Sore throat started 5 days ago.). There has been no fever. Associated symptoms include congestion, coughing, ear pain, a plugged ear sensation, rhinorrhea, sinus pain, a sore throat and swollen glands. Pertinent negatives include no nausea, sneezing, vomiting or wheezing. Associated symptoms comments: Clear phlegm  chills  Has chronic pain in riht sinus in middle of work up.. She has tried NSAIDs (Duke magic mouthwash, intolerant to antihistamines in past) for the symptoms. The treatment provided mild relief.   Pt with history of fibromyalgia,chronic cough due to GERD as well as mild bronchiectasis.   Review of Systems  HENT: Positive for ear pain, congestion, sore throat and rhinorrhea. Negative for sneezing.   Respiratory: Positive for cough. Negative for wheezing.   Gastrointestinal: Negative for nausea and vomiting.       Objective:   Physical Exam  Constitutional: Vital signs are normal. She appears well-developed and well-nourished. She is cooperative.  Non-toxic appearance. She does not appear ill. No distress.  HENT:  Head: Normocephalic.  Right Ear: Hearing, tympanic membrane, external ear and ear canal normal. Tympanic membrane is not erythematous, not retracted and not bulging.  Left Ear: Hearing, tympanic membrane, external ear and ear canal normal. Tympanic membrane is not erythematous, not retracted and not bulging.  Nose: Mucosal edema and rhinorrhea present. Right sinus exhibits no maxillary sinus tenderness and no frontal sinus tenderness. Left sinus exhibits no maxillary sinus tenderness and no frontal sinus tenderness.  Mouth/Throat: Uvula is midline, oropharynx is clear  and moist and mucous membranes are normal.  Eyes: Conjunctivae normal, EOM and lids are normal. Pupils are equal, round, and reactive to light. No foreign bodies found.  Neck: Trachea normal and normal range of motion. Neck supple. Carotid bruit is not present. No mass and no thyromegaly present.  Cardiovascular: Normal rate, regular rhythm, S1 normal, S2 normal, normal heart sounds, intact distal pulses and normal pulses.  Exam reveals no gallop and no friction rub.   No murmur heard. Pulmonary/Chest: Effort normal and breath sounds normal. Not tachypneic. No respiratory distress. She has no decreased breath sounds. She has no wheezes. She has no rhonchi. She has no rales.  Neurological: She is alert.  Skin: Skin is warm, dry and intact. No rash noted.  Psychiatric: Her speech is normal and behavior is normal. Judgment normal. Her mood appears not anxious. Cognition and memory are normal. She does not exhibit a depressed mood.          Assessment & Plan:

## 2012-03-21 NOTE — Addendum Note (Signed)
Addended by: Consuello Masse on: 03/21/2012 10:33 AM   Modules accepted: Orders

## 2012-03-21 NOTE — Assessment & Plan Note (Signed)
No sign of bacterial infection. Symptomatic care. Spent 20 min in discussion with pt. Reassured her.  She does have levaquin 500 at home so if not improving in next week she can use 7-10 days of this.

## 2012-03-21 NOTE — Addendum Note (Signed)
Addended by: Kerby Nora E on: 03/21/2012 10:42 AM   Modules accepted: Orders

## 2012-03-21 NOTE — Assessment & Plan Note (Signed)
Noted to nurse as she was leaving...already had nml GYN exam. Interested in having UA checked but no dysuria, frequency or urgency change.  UA abromal but microscopic showed no abnormalities other than contamination with skin cells.

## 2012-03-22 NOTE — Telephone Encounter (Signed)
Tried to reach patient unable to contact her

## 2012-03-26 NOTE — Telephone Encounter (Signed)
Caller: Ferris/Patient; Patient Name: Ashley [name]; PCP: [name]; Best Callback Phone Number: [phone].  Having flu like symptoms that started several weeks ago.  She has seen her allergist and said ears were clear, saw Dr. Dr. Ermalene Searing on 03/21/12 complaining of severe throat, neck ache, headaches, stomache.  MD could not find anything wrong.  Pt said told MD she had Levaquin on hand at home and MD told her to only take this if she started feeling worse.  Pt forgot to ask MD if this was ok for her to take with her heart condition.  Said Ejection Fraction is 34 %.  Had her last heart echo about 2 months ago.  She has a [employer] administrator.  She is in [place] now [employer], she has the Levaquin with her but wants to know if she can go ahead and take this.  Afebrile.  Did vomit x1 last night.  Emergent symptoms r/o by Flu Like Symptoms guidelines with exception of evaluated by provider and symptoms worseing when following recommended treatment plan for 48 hours.  (Call Provider Immediately).  Care advice given.  OFFICE PLEASE CALL PT BACK AT [phone] TO ADVISE IF MD THINKS THE LEVAQUIN IS OK FOR HER TO TAKE WITH HER HEART CONDITION.  PT WILL CALL OFFICE BACK WITH PHARMACY INFORMATION (WHEN SHE CAN FIND A PHARMACY CLOSE TO THE RESORT) IN CASE MD WANTS TO CALL IN SOME TAMIFLU.  THANKS.

## 2012-03-26 NOTE — Telephone Encounter (Signed)
Patient advised.

## 2012-03-26 NOTE — Telephone Encounter (Signed)
Symptoms not consistent with the flu.  Okay to take levaquin 1 tab po daily x 5-7 days

## 2012-03-28 ENCOUNTER — Encounter: Payer: Self-pay | Admitting: *Deleted

## 2012-04-01 ENCOUNTER — Other Ambulatory Visit: Payer: Self-pay | Admitting: Family Medicine

## 2012-04-01 ENCOUNTER — Ambulatory Visit (INDEPENDENT_AMBULATORY_CARE_PROVIDER_SITE_OTHER)
Admission: RE | Admit: 2012-04-01 | Discharge: 2012-04-01 | Disposition: A | Discharge status: Home / Self Care | Payer: Medicare Other | Source: Ambulatory Visit | Attending: Family Medicine | Admitting: Family Medicine

## 2012-04-01 ENCOUNTER — Encounter: Payer: Self-pay | Admitting: Family Medicine

## 2012-04-01 ENCOUNTER — Ambulatory Visit (INDEPENDENT_AMBULATORY_CARE_PROVIDER_SITE_OTHER): Payer: Medicare Other | Admitting: Family Medicine

## 2012-04-01 VITALS — BP 108/74 | HR 88 | Temp 97.7°F

## 2012-04-01 DIAGNOSIS — R252 Cramp and spasm: Secondary | ICD-10-CM | POA: Diagnosis not present

## 2012-04-01 DIAGNOSIS — R112 Nausea with vomiting, unspecified: Secondary | ICD-10-CM

## 2012-04-01 DIAGNOSIS — R109 Unspecified abdominal pain: Secondary | ICD-10-CM

## 2012-04-01 LAB — COMPREHENSIVE METABOLIC PANEL
ALT: 19 U/L (ref 0–35)
AST: 21 U/L (ref 0–37)
Albumin: 5.5 g/dL — ABNORMAL HIGH (ref 3.5–5.2)
Alkaline Phosphatase: 96 U/L (ref 39–117)
BUN: 49 mg/dL — ABNORMAL HIGH (ref 6–23)
CO2: 20 mEq/L (ref 19–32)
Calcium: 11.1 mg/dL — ABNORMAL HIGH (ref 8.4–10.5)
Chloride: 99 mEq/L (ref 96–112)
Creat: 2.16 mg/dL — ABNORMAL HIGH (ref 0.50–1.10)
Glucose, Bld: 103 mg/dL — ABNORMAL HIGH (ref 70–99)
Potassium: 4.1 mEq/L (ref 3.5–5.3)
Sodium: 137 mEq/L (ref 135–145)
Total Bilirubin: 0.5 mg/dL (ref 0.3–1.2)
Total Protein: 9 g/dL — ABNORMAL HIGH (ref 6.0–8.3)

## 2012-04-01 LAB — CBC WITH DIFFERENTIAL/PLATELET
Basophils Absolute: 0 10*3/uL (ref 0.0–0.1)
Basophils Relative: 0 % (ref 0–1)
Eosinophils Absolute: 0.2 10*3/uL (ref 0.0–0.7)
Eosinophils Relative: 1 % (ref 0–5)
HCT: 37.1 % (ref 36.0–46.0)
Hemoglobin: 13 g/dL (ref 12.0–15.0)
Lymphocytes Relative: 12 % (ref 12–46)
Lymphs Abs: 2.1 10*3/uL (ref 0.7–4.0)
MCH: 29.8 pg (ref 26.0–34.0)
MCHC: 35 g/dL (ref 30.0–36.0)
MCV: 85.1 fL (ref 78.0–100.0)
Monocytes Absolute: 1.2 10*3/uL — ABNORMAL HIGH (ref 0.1–1.0)
Monocytes Relative: 7 % (ref 3–12)
Neutro Abs: 14.2 10*3/uL — ABNORMAL HIGH (ref 1.7–7.7)
Neutrophils Relative %: 80 % — ABNORMAL HIGH (ref 43–77)
Platelets: 275 10*3/uL (ref 150–400)
RBC: 4.36 MIL/uL (ref 3.87–5.11)
RDW: 12.6 % (ref 11.5–15.5)
WBC: 17.7 10*3/uL — ABNORMAL HIGH (ref 4.0–10.5)

## 2012-04-01 LAB — LIPASE: Lipase: 49 U/L (ref 0–75)

## 2012-04-01 MED ORDER — PROMETHAZINE HCL 25 MG PO TABS: 25.0000 mg | ORAL_TABLET | Freq: Three times a day (TID) | ORAL | Status: DC | PRN

## 2012-04-01 MED ORDER — METRONIDAZOLE 500 MG PO TABS: 500.0000 mg | ORAL_TABLET | Freq: Three times a day (TID) | ORAL | Status: DC

## 2012-04-01 MED ORDER — CIPROFLOXACIN HCL 250 MG PO TABS: 250.0000 mg | ORAL_TABLET | Freq: Two times a day (BID) | ORAL | Status: DC

## 2012-04-01 MED ORDER — PROMETHAZINE HCL 25 MG/ML IJ SOLN
25.0000 mg | Freq: Once | INTRAMUSCULAR | Status: AC
  Administered 2012-04-01: 25 mg via INTRAMUSCULAR

## 2012-04-01 NOTE — Progress Notes (Signed)
Subjective:    Patient ID: Ashley [name], female    DOB: 09-09-1945, 66 y.o.   MRN: [id number]  HPI CC: malaise, emesis, body aches, abd pain  Seen here 03/21/2012 by Dr. B with dx viral URI with cough.  Seen prior by ENT for ear ache, throat ache, and neck hurting.  Treated with mucinex.  After seen here, went to [employer] in [place].  Felt nauseated during this trip.  Rode in wheelchair, very little walking.  Had leftover levaquin.  On 03/25/2012, had episode of vomiting.  Started on Levaquin Tuesday morning.  Took 500mg  x 5 days.  Was also using scopalamine patch.  Tried zofran for nausea, didn't help.  Didn't feel well all week long, seems to have worsened over weekend.  This morning ate some oatmeal, but vomited this afterwards.  Emesis seems to be getting worse.  Cough present as well.  + abd pain present.  No blood in stool.  No dysuria.  Denies chest pain/tightness, SOB. No BM in 1 week.  Passing gas ok.   Thinks stayed hydrated during this past week. + gum ache, TMJ pain, HA, ST, ear pain.  Past Medical History  Diagnosis Date  . Migraine without aura, without mention of intractable migraine without mention of status migrainosus   . Diverticulosis of colon     no diverticulitis  . Pure hypercholesterolemia   . Irritable bowel syndrome   . Laryngeal nodule     Dr. Thelma Comp  . Fibrocystic breast disease   . Osteoarthritis   . Hemorrhoids   . Anxiety   . GERD (gastroesophageal reflux disease)   . Systolic CHF     EF 25-35%, LV dilation, significant MR, goal weight 150lbs  . Nonischemic cardiomyopathy   . Pneumonia 2012  . Mitral regurgitation   . Anxiety   . Pleural effusion, bilateral 2012  . Bronchiectasis     mild, treat URIs aggressively  . Hypertension   . Heart murmur   . Angina   . Lung disease 07/14/11  . Sleep apnea   . Shortness of breath     "resting"  . Anemia   . Fibromyalgia   . Depression   . Disorder of vocal cords   . COPD (chronic  obstructive pulmonary disease)   . Chronic sinusitis      Review of Systems Per HPI    Objective:   Physical Exam  Nursing note and vitals reviewed. Constitutional: She appears well-developed and well-nourished. No distress.       Uncomfortable. Intermittent dry heaves and coughing  HENT:  Head: Normocephalic and atraumatic.  Right Ear: Hearing, tympanic membrane, external ear and ear canal normal.  Left Ear: Hearing, tympanic membrane, external ear and ear canal normal.  Nose: No mucosal edema or rhinorrhea. Right sinus exhibits no maxillary sinus tenderness and no frontal sinus tenderness. Left sinus exhibits no maxillary sinus tenderness and no frontal sinus tenderness.  Mouth/Throat: Uvula is midline and mucous membranes are normal. No oropharyngeal exudate, posterior oropharyngeal edema, posterior oropharyngeal erythema or tonsillar abscesses.       Tender with ear speculum exam. Dry mucous membranes.  Eyes: Conjunctivae normal and EOM are normal. Pupils are equal, round, and reactive to light. No scleral icterus.  Neck: Normal range of motion. Neck supple.  Cardiovascular: Normal rate, regular rhythm, normal heart sounds and intact distal pulses.   No murmur heard. Pulmonary/Chest: Effort normal and breath sounds normal. No respiratory distress. She has no wheezes. She has no  rales.  Abdominal: Soft. Bowel sounds are normal. She exhibits no distension. There is tenderness. There is no rebound and no guarding.       Diffusely tender throughout abdomen as well as throughout body.  No focal point of maximal tenderness.  Musculoskeletal: She exhibits no edema.  Lymphadenopathy:    She has no cervical adenopathy.  Skin: Skin is warm and dry. No rash noted.  Psychiatric: She has a normal mood and affect.   In tears today after blood pressure check.    Assessment & Plan:

## 2012-04-01 NOTE — Assessment & Plan Note (Addendum)
Generalized abd pain with nausea/vomiting and constipation. Difficult to pinpoint story with patient as no localizing findings, but it is evident that she is uncomfortable and with malaise. During exam very nauseated, significant improvement with phenergan.   ?viral AGE?  Anticipate dehydration contributing to malaise, stressed importance of several small sips per day. Generalized abd pain, does have LLQ pain as well and h/o diverticulitis, however not consistent with current presentation.  S/p appendectomy. Will start with abd series to r/o obstruction. Check blood work (CBC, CMP, lipase), will call with results. Greater than 30 minutes were spent face-to-face with the patient during this encounter and over half of that time was spent on counseling and coordination of care Recommended stop statin for now. I also asked her to call us in 2 days with an update.  ==> Addendum - WBC elevated at 17 with left shift, Cr 2.1 with ARF.  Also with hyperproteinemia.  Lipase normal.  Concern for diverticulitis with ARF  Encourage water intake, holding torsemide and zaroxolyne until felling better, recheck blood work at end of week for progress.  (See lab results.)

## 2012-04-01 NOTE — Telephone Encounter (Signed)
plz phone in. 

## 2012-04-01 NOTE — Patient Instructions (Addendum)
Let's cut torsemide dose in 1/2 for next 2 days while we get blood work results. Hold pravastatin. Good to see you today, I'm sorry you're not feeling well.   Very important to stay hydrated - as dehydration will make nausea worse. This nausea may be coming from a viral gastroenteritis or from just being dehydrated. xrays negative for obstruction today.  I will call you if any change in radiologist's read. We will call you with results of blood work.

## 2012-04-01 NOTE — Assessment & Plan Note (Signed)
Generalized muscle aches - ?fibromyalgia flare. Given concern for malaise and dehydration, recommended decrease torsemide by 1/2 for next 2 days and pt to call me in an update next week with #s.

## 2012-04-01 NOTE — Addendum Note (Signed)
Addended by: [name] on: 04/01/2012 11:45 PM   Modules accepted: Orders

## 2012-04-02 ENCOUNTER — Telehealth: Payer: Self-pay

## 2012-04-02 ENCOUNTER — Telehealth: Payer: Self-pay | Admitting: Family Medicine

## 2012-04-02 NOTE — Telephone Encounter (Signed)
Rx called in as directed.   

## 2012-04-02 NOTE — Telephone Encounter (Signed)
Pt had gotten lab results and is holding Torsemide and Zaroxlyn for 5 days. Pt usually takes Potassium pill with Torsemide; Does Dr Reece Agar want pt to hold Potassium also or should pt continue Potassium.

## 2012-04-02 NOTE — Telephone Encounter (Signed)
Noted. See lab results.

## 2012-04-02 NOTE — Telephone Encounter (Signed)
Patient notified

## 2012-04-02 NOTE — Telephone Encounter (Signed)
[facility] Triage Call Report Triage Record Num: [id number] Operator: Albertine Grates Patient Name: Ashley [name] Call Date & Time: 04/01/2012 10:12:12PM Patient Phone: [phone] PCP: [name] Patient Gender: Female PCP Fax : [phone] Patient DOB: 1946-01-10 Practice Name: [facility] Reason for Call: Caller: Ashley [name]; PCP: [name] [facility]); CB#: [phone]; [facility] calling and states patient had CBC and CMP drawn 11-18 and no critical results. Abnormals on CNC w/diff was WBC/17-7-high, granulocyte %/80-high, absolute granulocyte/14.2-high, absolute monocyte/1.2-high,. CMP abnromals: glucose/103-high, BUN/49-high, creatinine/2.1-high, total protein/9.0-high, albumin/5.5-high, calcium/11.1-high, lipase normal/49. No critical results. MD not notified. Protocol(s) Used: Office Note Recommended Outcome per Protocol: Information Noted and Sent to Office Reason for Outcome: Caller information to office Care Advice: ~ 04/01/2012 10:17:02PM Page 1 of 1 [email or web address]

## 2012-04-02 NOTE — Telephone Encounter (Signed)
Potassium level was normal.  Let's hold potassium while torsemide on hold.

## 2012-04-04 ENCOUNTER — Other Ambulatory Visit (INDEPENDENT_AMBULATORY_CARE_PROVIDER_SITE_OTHER): Payer: Medicare Other

## 2012-04-04 DIAGNOSIS — R109 Unspecified abdominal pain: Secondary | ICD-10-CM | POA: Diagnosis not present

## 2012-04-04 LAB — COMPREHENSIVE METABOLIC PANEL
ALT: 20 U/L (ref 0–35)
AST: 24 U/L (ref 0–37)
Albumin: 4 g/dL (ref 3.5–5.2)
Alkaline Phosphatase: 84 U/L (ref 39–117)
BUN: 19 mg/dL (ref 6–23)
CO2: 29 mEq/L (ref 19–32)
Calcium: 9.4 mg/dL (ref 8.4–10.5)
Chloride: 105 mEq/L (ref 96–112)
Creatinine, Ser: 1.2 mg/dL (ref 0.4–1.2)
GFR: 46.33 mL/min — ABNORMAL LOW (ref >60.00)
Glucose, Bld: 108 mg/dL — ABNORMAL HIGH (ref 70–99)
Potassium: 3.9 mEq/L (ref 3.5–5.1)
Sodium: 140 mEq/L (ref 135–145)
Total Bilirubin: 0.4 mg/dL (ref 0.3–1.2)
Total Protein: 7.3 g/dL (ref 6.0–8.3)

## 2012-04-05 ENCOUNTER — Ambulatory Visit (INDEPENDENT_AMBULATORY_CARE_PROVIDER_SITE_OTHER): Payer: Medicare Other | Admitting: Cardiovascular Disease

## 2012-04-05 ENCOUNTER — Encounter: Payer: Self-pay | Admitting: Cardiovascular Disease

## 2012-04-05 VITALS — BP 101/66 | HR 82 | Ht 65.5 in | Wt 153.0 lb

## 2012-04-05 DIAGNOSIS — I5023 Acute on chronic systolic (congestive) heart failure: Secondary | ICD-10-CM | POA: Diagnosis not present

## 2012-04-05 DIAGNOSIS — E7800 Pure hypercholesterolemia, unspecified: Secondary | ICD-10-CM

## 2012-04-05 DIAGNOSIS — I509 Heart failure, unspecified: Secondary | ICD-10-CM

## 2012-04-05 DIAGNOSIS — R079 Chest pain, unspecified: Secondary | ICD-10-CM | POA: Diagnosis not present

## 2012-04-05 DIAGNOSIS — R0602 Shortness of breath: Secondary | ICD-10-CM | POA: Diagnosis not present

## 2012-04-05 DIAGNOSIS — R05 Cough: Secondary | ICD-10-CM

## 2012-04-05 DIAGNOSIS — N289 Disorder of kidney and ureter, unspecified: Secondary | ICD-10-CM

## 2012-04-05 DIAGNOSIS — R059 Cough, unspecified: Secondary | ICD-10-CM

## 2012-04-05 NOTE — Assessment & Plan Note (Signed)
Cough seemed to improve on today's visit. No cough throughout her exam. She would certainly tolerate the procedure from a cardiovascular perspective but I suggested she exhausted her medical options before proceeding with surgery.

## 2012-04-05 NOTE — Progress Notes (Signed)
Patient ID: Ashley [name], female    DOB: August 25, 1945, 66 y.o.   MRN: [id number]  HPI Comments: 66 y/o female with history of pneumonia, chronic cough felt secondary to GERD, abnormal PFTs in the past, shortness of breath dating back to September 2012 that presented acutely, CT scan showing progressive pleural effusion , echo showing Cardiomyopathy  ejection fraction estimated at 25-35%,  Previous  Lexiscan Myoview  showed no significant ischemia, no EKG changes concerning for ischemia.   Follow up admission to [facility] in December 2012 for bronchitis and hypoxia as well as systolic CHF. She had antibiotics, prednisone and diuresis and has felt better since discharge. ACE inhibitor placed on hold secondary to chronic cough.  S/P ICD 10 days ago for EF <35%.  Goal weight has been in the low 150 range. She has been managing her weight well with torsemide and metolazone. Previous Cardiac MRI showed ejection fraction 34% ICD placed  by Dr. Ladona Ridgel.  She has had a challenging 2 months. She had any upper respiratory infection with a sore mouth. Severe back pain, trip to [place] with severe nausea vomiting. Started on Endolimax for possible diverticulitis though unable to exclude gastroenteritis. She had minimal by mouth fluid intake in the past week in the setting of her nausea vomiting and had acute renal insufficiency with creatinine greater than 2. This improved by increasing her fluid intake and holding her diuretics. She also reports having blurriness in her left eye with discomfort.  Problems in the past on Lipitor with cramping. Started on pravastatin every other day.  Currently she reports that she feels much better. No significant shortness of breath or edema. Weight is stable in the low 150 range  EKG shows normal sinus rhythm with rate 82 beats per minute with nonspecific ST abnormality      Outpatient Encounter Prescriptions as of 04/05/2012  Medication Sig Dispense  Refill  . ALPRAZolam (XANAX) 0.25 MG tablet TAKE 1 TABLET BY MOUTH TWICE A DAY  60 tablet  0  . aspirin 81 MG EC tablet Take 81 mg by mouth at bedtime.       Marland Kitchen CALCIUM-VITAMIN D PO Take 1 capsule by mouth daily.      . carvedilol (COREG) 12.5 MG tablet Take 18.75 mg by mouth daily. Take 1 and a half tablets daily      . ciprofloxacin (CIPRO) 250 MG tablet Take 1 tablet (250 mg total) by mouth 2 (two) times daily.  20 tablet  0  . Coenzyme Q10 (COQ-10) 150 MG CAPS Take 1 tablet by mouth daily.      Marland Kitchen dextromethorphan (DELSYM) 30 MG/5ML liquid Take 60 mg by mouth every 12 (twelve) hours as needed. For cough/congestion.      . hydrALAZINE (APRESOLINE) 25 MG tablet Takes 0.5 tablet daily.      Marland Kitchen HYDROcodone-acetaminophen (VICODIN) 5-500 MG per tablet Take 1 tablet by mouth every 8 (eight) hours as needed for pain.  20 tablet  0  . metroNIDAZOLE (FLAGYL) 500 MG tablet Take 1 tablet (500 mg total) by mouth 3 (three) times daily.  30 tablet  0  . mometasone (NASONEX) 50 MCG/ACT nasal spray Place 1 spray into the nose 2 (two) times daily.       Marland Kitchen omeprazole (PRILOSEC) 40 MG capsule Take 40 mg by mouth 2 (two) times daily.      . promethazine (PHENERGAN) 25 MG tablet Take 1 tablet (25 mg total) by mouth every 8 (eight) hours as  needed for nausea. Without red dye  30 tablet  0  . spironolactone (ALDACTONE) 25 MG tablet Take 1 tablet (25 mg total) by mouth daily.  90 tablet  3  . triamcinolone cream (KENALOG) 0.1 %       . vitamin B-12 (CYANOCOBALAMIN) 500 MCG tablet Take 1 tablet (500 mcg total) by mouth daily.      . vitamin C (ASCORBIC ACID) 500 MG tablet Take 500 mg by mouth 2 (two) times daily.       Marland Kitchen zolpidem (AMBIEN) 5 MG tablet Take 2.5-5 mg by mouth at bedtime as needed. For insomnia       . metolazone (ZAROXOLYN) 2.5 MG tablet Take 1 tablet (2.5 mg total) by mouth every other day. Take one tablet every other day before torsemide  45 tablet  3  . potassium chloride (K-DUR) 10 MEQ tablet Take 1-4  tablets (10-40 mEq total) by mouth daily as needed. for every 10mg  toresmide  120 tablet  11  . pravastatin (PRAVACHOL) 80 MG tablet Take 1 tablet (80 mg total) by mouth daily.  30 tablet  11  . torsemide (DEMADEX) 10 MG tablet Take two tablets twice a day.  360 tablet  3   Review of Systems  Constitutional: Negative.   HENT: Negative.   Eyes: Negative.   Respiratory: Negative.   Cardiovascular: Negative.   Gastrointestinal: Negative.   Musculoskeletal: Negative.   Skin: Negative.   Neurological: Negative.   Hematological: Negative.   Psychiatric/Behavioral: Negative.   All other systems reviewed and are negative.     BP 101/66  Pulse 82  Ht 5' 5.5" (1.664 m)  Wt 153 lb (69.4 kg)  BMI 25.07 kg/m2  Physical Exam  Nursing note and vitals reviewed. Constitutional: She is oriented to person, place, and time. She appears well-developed and well-nourished.  HENT:  Head: Normocephalic.  Nose: Nose normal.  Mouth/Throat: Oropharynx is clear and moist.  Eyes: Conjunctivae normal are normal. Pupils are equal, round, and reactive to light.  Neck: Normal range of motion. Neck supple. No JVD present.  Cardiovascular: Normal rate, regular rhythm, S1 normal, S2 normal, normal heart sounds and intact distal pulses.  Exam reveals no gallop and no friction rub.   No murmur heard. Pulmonary/Chest: Effort normal and breath sounds normal. No respiratory distress. She has no wheezes. She has no rales. She exhibits no tenderness.  Abdominal: Soft. Bowel sounds are normal. She exhibits no distension. There is no tenderness.  Musculoskeletal: Normal range of motion. She exhibits no edema and no tenderness.  Lymphadenopathy:    She has no cervical adenopathy.  Neurological: She is alert and oriented to person, place, and time. Coordination normal.  Skin: Skin is warm and dry. No rash noted. No erythema.  Psychiatric: She has a normal mood and affect. Her behavior is normal. Judgment and  thought content normal.         Assessment and Plan

## 2012-04-05 NOTE — Patient Instructions (Addendum)
You are doing well. No medication changes were made.  Please continue your diuretic regimen as you are doing Goal weight 150 to 153 pounds  Please call us if you have new issues that need to be addressed before your next appt.  Your physician wants you to follow-up in: 6 months.  You will receive a reminder letter in the mail two months in advance. If you don't receive a letter, please call our office to schedule the follow-up appointment.

## 2012-04-05 NOTE — Assessment & Plan Note (Signed)
Creatinine greater than 2 several days ago, improved after hydration and holding her diuretic. We have suggested she slowly restart her diuretic regimen to as long as her fluid intake is normal. If she has recurrent vomiting/GI distress in the future, we have suggested she hold all diuretics until symptoms resolve.

## 2012-04-05 NOTE — Assessment & Plan Note (Signed)
Goal weight is in the low 150 range. She will resume torsemide with occasional metolazone only if weight increases .over the past several months, she has done a relatively good job of managing her heart failure and diuretic regimen .potassium has stayed relatively stable .

## 2012-04-05 NOTE — Assessment & Plan Note (Signed)
She seems to be tolerating pravastatin so far. She does report occasional cramping in her legs.

## 2012-04-09 ENCOUNTER — Ambulatory Visit (INDEPENDENT_AMBULATORY_CARE_PROVIDER_SITE_OTHER): Payer: Medicare Other | Admitting: Family Medicine

## 2012-04-09 ENCOUNTER — Encounter: Payer: Self-pay | Admitting: Family Medicine

## 2012-04-09 ENCOUNTER — Ambulatory Visit (INDEPENDENT_AMBULATORY_CARE_PROVIDER_SITE_OTHER): Payer: Medicare Other | Admitting: Pulmonary Disease

## 2012-04-09 ENCOUNTER — Encounter: Payer: Self-pay | Admitting: Pulmonary Disease

## 2012-04-09 VITALS — BP 90/60 | HR 74 | Temp 97.9°F | Ht 65.5 in | Wt 154.8 lb

## 2012-04-09 VITALS — BP 100/60 | HR 80 | Temp 97.4°F | Wt 155.0 lb

## 2012-04-09 DIAGNOSIS — R05 Cough: Secondary | ICD-10-CM

## 2012-04-09 DIAGNOSIS — I509 Heart failure, unspecified: Secondary | ICD-10-CM

## 2012-04-09 DIAGNOSIS — R059 Cough, unspecified: Secondary | ICD-10-CM

## 2012-04-09 DIAGNOSIS — N289 Disorder of kidney and ureter, unspecified: Secondary | ICD-10-CM | POA: Diagnosis not present

## 2012-04-09 DIAGNOSIS — E7800 Pure hypercholesterolemia, unspecified: Secondary | ICD-10-CM | POA: Diagnosis not present

## 2012-04-09 DIAGNOSIS — R109 Unspecified abdominal pain: Secondary | ICD-10-CM

## 2012-04-09 DIAGNOSIS — I5023 Acute on chronic systolic (congestive) heart failure: Secondary | ICD-10-CM

## 2012-04-09 NOTE — Assessment & Plan Note (Addendum)
Dr. Marisa Sprinkles is recommending that she be considered for a Nissen fundoplication due to her cough. I explained to her that based on the results of her esophageal impedence study at [facility], acid reflux is contributing to her cough. However what is less likely to me is whether or not a Nissen fundoplication could be performed safely given Illeana's propensity for volume overload. I also explained to [place] that while there is a chance that the Nissen would help there is no guarantee. She has to decide whether or not the cough is bad enough to consider this surgery.  In the meantime I have advised that we continue conservative measures such as focusing on her sinus disease and allergic rhinitis and vocal cord dysfunction. I've also advised that she continue taking her acid reflux regimen and follow strict lifestyle modification for gastroesophageal reflux disease.

## 2012-04-09 NOTE — Progress Notes (Signed)
  Subjective:    Patient ID: Ashley [name], female    DOB: 04-22-1946, 66 y.o.   MRN: [id number]  HPI CC: f/u  See prior note for details.  Treated with cipro and flagyl for presumed diverticulitis.  Feeling much better today.  In better spirits today.  Still with sore throat - chronic issue for years.  Still with deep cough, minimally productive.  Back on solid food diet.  Drinking 4-6 10oz glasses of water daily.  Thinks staying well hydrated.  GERD - Dr. Mariah Milling recommends against Nissen fundoplication.  Pt has decided against pursuing surgery for present.  Goal weight 150-153 lbs.  Reviewed all blood work with pt including kidney function.  Wt Readings from Last 3 Encounters:  04/09/12 155 lb (70.308 kg)  04/09/12 154 lb 12.8 oz (70.217 kg)  04/05/12 153 lb (69.4 kg)    Review of Systems Per HPI    Objective:   Physical Exam  Nursing note and vitals reviewed. Constitutional: She appears well-developed and well-nourished. No distress.  Cardiovascular: Normal rate, regular rhythm, normal heart sounds and intact distal pulses.   No murmur heard. Pulmonary/Chest: Effort normal and breath sounds normal. No respiratory distress. She has no wheezes. She has no rales.  Psychiatric:       Brighter affect, bright mood        Assessment & Plan:

## 2012-04-09 NOTE — Progress Notes (Signed)
Subjective:    Patient ID: Ashley [name], female    DOB: 28-Mar-1946, 66 y.o.   MRN: [id number]  Synopsis: 66 y/o female with chronic cough, intermittent wheezing, and multiple hospitalizations for pneumonia presented to our clinic initially on 02/20/11 for evaluation of the same. She has seen an allergist for years for allergic rhinitis and had been told that she did not have asthma. We ordered an echocardiogram which showed significanly decreased LV function. She underwent a cardiac myoview which showed no evidence of ischaemia. She had a negative metacholine challenge test in 07/2009.  She was admitted in 04/2011 for volume overload.  A CT scan during that time showed volume overload and a pulmonary nodule.  There were [facility] CT chest reads suggestive of bronchiectasis but I (McQuaid) have never been convinced neither have [facility] and [employer].  She was seen by Dr. Thana Ates in 09/2011 for recurrent cough.  This was felt to be due to GERD and a Nissen Fundoplication has been considered.     HPI   04/03/11 ROV --Ashley [name] has had a busy 6 weeks since our last visit as detailed above. She has multiple complaints today, but her primary respiratory complaints are wheezing, shortness of breath, and continuous cough productive of clear sputum. She says that the symptoms are worse at night when she lies flat, so she has to prop her head up. She is a little more swollen than usual. She says that the tramadol makes her stomach hurt. She doesn't think that the pepcid helps. Her allergist put her on Asthmanex but she doesn't think that it has helped. She was put on a prednisone taper last week and she says that this has not helped her wheeze or shortness of breath at all. She wheezes nearly continuously. No fevers or chills.  She notes that she frequently wakes up many times during the night choking and gasping for air. Her husband says that she stops breathing at times.  She also notes waking up  choking frequently in the middle of the night.  Her sinuses have been well controlled recently.   04/26/11 ROV -- Ashley [name] returns for further evaluation of shortness of breath and cough. She states that she has had chills at home in the last week, associated with sorethroat, cough, wheezing (especially at night), clear sputum production, and chest pain with cough. She says that she has woken up multiple times at night with wheezing which is relieved with albuterol.  She stopped taking the bystolic and feels better now (less short of breath). She has also been taking more furosemide per Dr. Windell Hummingbird instructions.   05/24/11 ROV/Hosp f/u-- She reports that she is doing fairly well after her recent hospitalization for volume overload and cough. Her cough has improved quite a bit since then and she is not as short of breath since starting on oxygen. She only has scant clear sputum production. She notes that she does not urinate much on her current dose of lasix. She is taking the lasix and metolazone as written. She stopped taking the alvesco and nebulized meds.  07/31/2011 ROV-- She is doing fairly well. She has some green sputum and sinus congestion on occasion, worse in the last three weeks.  No fevers or chills or change in baseline dyspnea.  For the most part she feels her breathing has improved. She has had some eratic blood pressures at home last week.   She is adjusting her lasix for a goal weight of  150 lbs.   10/26/2011 ROV -- Ashley [name] is doing pretty well.  Her energy has been up and down but overall she is better.  She is going to pulm rehab Mon and Friday.  She apparently recently stopped two of her blood pressure meds. She does not complain of dyspnea but she notes some fatigue and leg cramps with a lot of stiffness.  She still has occasional coughing paroxysms. She thinks that her cough is better while using the continuous O2.  She notes nose bleeding from the oxygen use.    03/2012 ROV --  Ashley [name] returns to clinic today. She's had a rough go of it the last several months with an episode of diverticulitis. Fortunately cough has not been a significant problem in the last several months. She states that it is at baseline and continues to bother her on a daily basis but it has been the least of her worries. She has had some issues with both acute renal failure and volume overload which are now improving after Dr. Mariah Milling and Sharen Hones adjusted her diuretics. She is not short of breath and her sinus disease is fairly well controlled.  Past Medical History  Diagnosis Date  . Migraine without aura, without mention of intractable migraine without mention of status migrainosus   . Diverticulosis of colon     no diverticulitis  . Pure hypercholesterolemia   . Irritable bowel syndrome   . Laryngeal nodule     Dr. Thelma Comp  . Fibrocystic breast disease   . Osteoarthritis   . Hemorrhoids   . Anxiety   . GERD (gastroesophageal reflux disease)   . Systolic CHF     EF 25-35%, LV dilation, significant MR, goal weight 150lbs  . Nonischemic cardiomyopathy   . Pneumonia 2012  . Mitral regurgitation   . Anxiety   . Pleural effusion, bilateral 2012  . Bronchiectasis     mild, treat URIs aggressively  . Hypertension   . Heart murmur   . Angina   . Lung disease 07/14/11  . Sleep apnea   . Shortness of breath     "resting"  . Anemia   . Fibromyalgia   . Depression   . Disorder of vocal cords   . COPD (chronic obstructive pulmonary disease)   . Chronic sinusitis       Review of Systems  Constitutional: Negative for chills, activity change, appetite change and fatigue.  HENT: Positive for nosebleeds.   Respiratory: Positive for cough. Negative for choking, chest tightness and wheezing.   Cardiovascular: Negative for chest pain, palpitations and leg swelling.       Objective:   Physical Exam  There were no vitals filed for this visit. Gen:  no acute distress HEENT: NCAT,  PERRL, EOMi, OP clear, neck supple without masses PULM: Insp wheezing only, heard best over trachea CV: RRR, no mgr, no JVD Chest: ICD pocket is not swollen tender or red Ext: warm, no edema, no clubbing, no cyanosis  12/14 /12 CT Thorax: mediastinal lymphadenopathy, question right hilar mass, mild bronchiectasis in bases, pleural effusion with increased interlobular thickening  06/12/11 CT thorax: IMPRESSION:  Interval clearing of congestive heart failure and associated  reactive mediastinal and bihilar adenopathy. No evidence of  interstitial lung disease.     Assessment & Plan:   Cough Dr. Marisa Sprinkles is recommending that she be considered for a Nissen fundoplication due to her cough. I explained to her that based on the results of her esophageal impedence study  at [facility], acid reflux is contributing to her cough. However what is less likely to me is whether or not a Nissen fundoplication could be performed safely given Ashley [name]'s propensity for volume overload. I also explained to Ashley [name] that while there is a chance that the Nissen would help there is no guarantee. She has to decide whether or not the cough is bad enough to consider this surgery.  In the meantime I have advised that we continue conservative measures such as focusing on her sinus disease and allergic rhinitis and vocal cord dysfunction. I've also advised that she continue taking her acid reflux regimen and follow strict lifestyle modification for gastroesophageal reflux disease.    Updated Medication List Outpatient Encounter Prescriptions as of 04/09/2012  Medication Sig Dispense Refill  . ALPRAZolam (XANAX) 0.25 MG tablet TAKE 1 TABLET BY MOUTH TWICE A DAY  60 tablet  0  . aspirin 81 MG EC tablet Take 81 mg by mouth at bedtime.       Marland Kitchen CALCIUM-VITAMIN D PO Take 1 capsule by mouth daily.      . carvedilol (COREG) 12.5 MG tablet Take 18.75 mg by mouth daily. Take 1 and a half tablets daily      . ciprofloxacin (CIPRO) 250  MG tablet Take 1 tablet (250 mg total) by mouth 2 (two) times daily.  20 tablet  0  . Coenzyme Q10 (COQ-10) 150 MG CAPS Take 1 tablet by mouth daily.      Marland Kitchen dextromethorphan (DELSYM) 30 MG/5ML liquid Take 60 mg by mouth every 12 (twelve) hours as needed. For cough/congestion.      . hydrALAZINE (APRESOLINE) 25 MG tablet Takes 0.5 tablet daily.      Marland Kitchen HYDROcodone-acetaminophen (VICODIN) 5-500 MG per tablet Take 1 tablet by mouth every 8 (eight) hours as needed for pain.  20 tablet  0  . metolazone (ZAROXOLYN) 2.5 MG tablet Take 1 tablet (2.5 mg total) by mouth every other day. Take one tablet every other day before torsemide  45 tablet  3  . metroNIDAZOLE (FLAGYL) 500 MG tablet Take 1 tablet (500 mg total) by mouth 3 (three) times daily.  30 tablet  0  . mometasone (NASONEX) 50 MCG/ACT nasal spray Place 1 spray into the nose 2 (two) times daily.       Marland Kitchen omeprazole (PRILOSEC) 40 MG capsule Take 40 mg by mouth 2 (two) times daily.      . potassium chloride (K-DUR) 10 MEQ tablet Take 1-4 tablets (10-40 mEq total) by mouth daily as needed. for every 10mg  toresmide  120 tablet  11  . pravastatin (PRAVACHOL) 80 MG tablet Take 1 tablet (80 mg total) by mouth daily.  30 tablet  11  . promethazine (PHENERGAN) 25 MG tablet Take 1 tablet (25 mg total) by mouth every 8 (eight) hours as needed for nausea. Without red dye  30 tablet  0  . spironolactone (ALDACTONE) 25 MG tablet Take 1 tablet (25 mg total) by mouth daily.  90 tablet  3  . torsemide (DEMADEX) 10 MG tablet Take two tablets twice a day.  360 tablet  3  . triamcinolone cream (KENALOG) 0.1 %       . vitamin B-12 (CYANOCOBALAMIN) 500 MCG tablet Take 1 tablet (500 mcg total) by mouth daily.      . vitamin C (ASCORBIC ACID) 500 MG tablet Take 500 mg by mouth 2 (two) times daily.       Marland Kitchen zolpidem (AMBIEN) 5 MG tablet Take  2.5-5 mg by mouth at bedtime as needed. For insomnia

## 2012-04-09 NOTE — Patient Instructions (Signed)
Use lemon drops as needed for dry mouth Continue using your nasal steroid and saline rinses daily Continue to monitor your weight closely and follow Dr. Windell Hummingbird instructions  Follow up with Dr. Marisa Sprinkles  Follow up with Dr. Juanda Chance and her recommendations for your acid reflux  We will see you back in 4-6 months or sooner if needed

## 2012-04-09 NOTE — Assessment & Plan Note (Signed)
Discussed slowly restarting statin as I doubt this contributed to sxs last week.

## 2012-04-09 NOTE — Assessment & Plan Note (Signed)
Goal weight 150-153lbs.  Tenuous balance between dehydration and fluid overload.

## 2012-04-09 NOTE — Assessment & Plan Note (Addendum)
Discussed elevated Cr and avoiding NSAIDs, as well as importance of hydration status. Seems chronic insufficiency since 05/2011. With recent ARF, at increased risk of rpt event. Consider stopping zaroxylyn "booster fluid pill" in future.

## 2012-04-09 NOTE — Assessment & Plan Note (Signed)
Presumed diverticulitis along with dehydration and ARF. Treated with cipro/flagyl - has 1 more day left.  abd pain has improved. In much brighter spirits today, excited for upcoming trip to [place]. Discussed may restart pravastatin.

## 2012-04-09 NOTE — Patient Instructions (Addendum)
Return to see me in 2 months for follow up Follow Dr Windell Hummingbird instructions for water pill. Good to see you today

## 2012-04-10 ENCOUNTER — Ambulatory Visit (INDEPENDENT_AMBULATORY_CARE_PROVIDER_SITE_OTHER): Payer: Medicare Other | Admitting: *Deleted

## 2012-04-10 ENCOUNTER — Telehealth: Payer: Self-pay

## 2012-04-10 DIAGNOSIS — R3989 Other symptoms and signs involving the genitourinary system: Secondary | ICD-10-CM

## 2012-04-10 DIAGNOSIS — R82998 Other abnormal findings in urine: Secondary | ICD-10-CM | POA: Diagnosis not present

## 2012-04-10 LAB — POCT URINALYSIS DIPSTICK
Bilirubin, UA: NEGATIVE
Blood, UA: NEGATIVE
Glucose, UA: NEGATIVE
Ketones, UA: NEGATIVE
Nitrite, UA: NEGATIVE
Protein, UA: NEGATIVE
Spec Grav, UA: 1.015
Urobilinogen, UA: 0.2
pH, UA: 6

## 2012-04-10 NOTE — Progress Notes (Signed)
Patient called and said her urine color was abnormal and she meant to talk to Dr. Reece Agar about it at her OV the other day and got sidetracked. She has no other symptoms, only the abnormal color. He said to come in for nurse visit for UA. UA performed and urine sent for micro and culture per his order.

## 2012-04-10 NOTE — Telephone Encounter (Signed)
Patient notified and nurse visit scheduled.

## 2012-04-10 NOTE — Telephone Encounter (Signed)
We did not discuss this at all at OV. May come in for UA today.  Please send this for micro and culture if abnormal.  Indication - hematuria.

## 2012-04-10 NOTE — Telephone Encounter (Signed)
Pt seen 04/09/12 and discussed light color of red in urine; pt was supposed to get u/a done at visit but discussed other issues and pt forgot to ask about urine specimen before leaving appt. Pt said today urine is a darker red;no pain upon urination or frequency. Pt said no burning of urine but urine feels hot when urinating. Pt wants to know if should come in for u/a.Please advise.

## 2012-04-11 LAB — URINALYSIS, MICROSCOPIC ONLY
Bacteria, UA: NONE SEEN
Casts: NONE SEEN
Crystals: NONE SEEN
Squamous Epithelial / LPF: NONE SEEN

## 2012-04-12 LAB — URINE CULTURE

## 2012-04-12 NOTE — Progress Notes (Signed)
See UA results.

## 2012-04-15 ENCOUNTER — Ambulatory Visit: Payer: Medicare Other | Admitting: Family Medicine

## 2012-04-16 ENCOUNTER — Other Ambulatory Visit: Payer: Self-pay | Admitting: Cardiovascular Disease

## 2012-04-17 ENCOUNTER — Other Ambulatory Visit: Payer: Self-pay | Admitting: *Deleted

## 2012-04-17 MED ORDER — HYDRALAZINE HCL 25 MG PO TABS: ORAL_TABLET | ORAL | Status: DC

## 2012-04-17 NOTE — Telephone Encounter (Signed)
Refilled Hydralazine

## 2012-04-22 ENCOUNTER — Telehealth: Payer: Self-pay

## 2012-04-22 NOTE — Telephone Encounter (Signed)
Pt still having problems with tendonitis in lt biceps and lt shoulder pain. Pt has been doing exercises but now has continuous ache and shorter range of motion and request neurology referral in [place] at [place] office.

## 2012-04-22 NOTE — Telephone Encounter (Signed)
This is not a neurology issue. Does she think restarting pravastatin has contributed? If she would like, could refer to ortho for further eval of L shoulder pain.  (last seen for this 03/05/2012).

## 2012-04-23 ENCOUNTER — Other Ambulatory Visit: Payer: Self-pay | Admitting: Family Medicine

## 2012-04-23 ENCOUNTER — Encounter: Payer: Self-pay | Admitting: Pulmonary Disease

## 2012-04-23 NOTE — Telephone Encounter (Signed)
plz phone in. 

## 2012-04-23 NOTE — Telephone Encounter (Signed)
Ok to refill 

## 2012-04-23 NOTE — Telephone Encounter (Signed)
Rx called in as directed.   

## 2012-04-24 ENCOUNTER — Encounter: Payer: Self-pay | Admitting: Internal Medicine

## 2012-04-24 ENCOUNTER — Telehealth: Payer: Self-pay | Admitting: Internal Medicine

## 2012-04-24 ENCOUNTER — Ambulatory Visit (INDEPENDENT_AMBULATORY_CARE_PROVIDER_SITE_OTHER): Payer: Medicare Other | Admitting: Internal Medicine

## 2012-04-24 ENCOUNTER — Telehealth: Payer: Self-pay | Admitting: *Deleted

## 2012-04-24 VITALS — BP 106/62 | HR 80 | Ht 64.5 in | Wt 153.4 lb

## 2012-04-24 DIAGNOSIS — M79602 Pain in left arm: Secondary | ICD-10-CM

## 2012-04-24 DIAGNOSIS — K219 Gastro-esophageal reflux disease without esophagitis: Secondary | ICD-10-CM

## 2012-04-24 DIAGNOSIS — R109 Unspecified abdominal pain: Secondary | ICD-10-CM

## 2012-04-24 NOTE — Telephone Encounter (Signed)
Sprue profile pending, will wait  For the results before recommending any dietary modifications

## 2012-04-24 NOTE — Telephone Encounter (Signed)
Message left notifying patient. Advised to call and advise if she noticed increase in pain since restarting pravastatin. Also advised to call and advise if she wanted ortho referral.

## 2012-04-24 NOTE — Progress Notes (Signed)
Ashley [name] 1946-03-31 MRN [id number]  History of Present Illness:  This is a 66 year old white female who is here to discuss a possible upper endoscopy. She has severe gastroesophageal reflux which was fully evaluated at [facility] last summer including a 24-hour pH probe and esophageal manometry. She had an essentially normal esophageal motility with a normal LES and 1 out of 10 contractions nonpropulsive. Her pH probe showed significant reflux correlating with a cough. She currently denies any symptoms of reflux while on Prilosec 40 mg twice a day. She was hospitalized last year with pneumonia which was attributed to aspiration. She was offered a Nissen fundoplication but because of the severe heart disease, she was not deemed to be a surgical candidate. She has a history of cardiomyopathy, having 25-30% ejection fraction. She has an ICD placement, systolic heart failure. She has chronic renal insufficiency with a creatinine as high as 2.1. She has severe diverticulosis as per colonoscopy in 2005 and had an episode of diverticulitis last summer. Patient was told at [facility] to have an upper endoscopy in [place] and get back to the gastroenterologist at [facility] for referral for Nissen fundoplication. At this point, patient is not sure that she wants to have surgery because of severe cardiac compromise. Dr Lewie Loron, her cardiologist did not recommend for her to have any type of surgery. She has never had an upper endoscopy.   Past Medical History  Diagnosis Date  . Migraine without aura, without mention of intractable migraine without mention of status migrainosus   . Diverticulosis of colon     no diverticulitis  . Pure hypercholesterolemia   . Irritable bowel syndrome   . Laryngeal nodule     Dr. Thelma Comp  . Fibrocystic breast disease   . Osteoarthritis   . Hemorrhoids   . Anxiety   . GERD (gastroesophageal reflux disease)   . Systolic CHF     EF 25-35%, LV dilation, significant MR, goal weight  150lbs  . Nonischemic cardiomyopathy   . Pneumonia 2012  . Mitral regurgitation   . Anxiety   . Pleural effusion, bilateral 2012  . Bronchiectasis     mild, treat URIs aggressively  . Hypertension   . Heart murmur   . Angina   . Lung disease 07/14/11  . Sleep apnea   . Shortness of breath     "resting"  . Anemia   . Fibromyalgia   . Depression   . Disorder of vocal cords   . COPD (chronic obstructive pulmonary disease)   . Chronic sinusitis    Past Surgical History  Procedure Date  . Appendectomy   . Mandible surgery     "got about 6 pins in the bottom of my jaw"  . Knee arthroscopy w/ orif     Left; "meniscus tear"  . Osteotomy     Left foot  . 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 Dexa 03/28/99    osteopenia L/S  . Colonoscopy 1998    nml (sigmoid-Gilbert-nol)  . Head mri, head ct 10/1998    ? H/A focus ([facility])  . Emg/mcv 10/16/01    + mild carpal tunnel  . Dexa 03/12/03    2.2 Spine 0.1 Hip  Osteopenia  . Mri right hip 12/12/01    Tendonitis Gluteus Medius to Gtr Trochanter Neg Bursitis (Dr. Montez Morita)  . L/s films 11/21/01    nml; Right hip nml  . Mri u/s 11/21/01    min. DDD L/3-4  . Colonoscopy  01/04/04    polyps, diverticulosis/severe  . Dexa 11/14/05    1.9 Spine 0.3 Hip  slight improvement  . Flex laryngoscopy 06/11/06    (Juengel) nml  . Dexa 11/22/06    improved osteo and fem neck  . [employer] bunionectomy 11/04/08    Right Great Toe (Dr. Lestine Box)  . Breast mass excision 01/2010    Left-fibrocystic change w/intraductal papilloma, no malignancy  . Ct chest 12/2010    possible bronchiectasis lower lobes, 02/2011 - enlarged bilateral effusions, bibasilar atx  . Stress myoview 02/2011    no significant ischemia  . Ct chest 04/2011    no PE.  abnormal R hilar and mediastinal adenopathy --> on rpt 05/2011, resolved  . US echocardiography 04/2011    mildly dilated LV, EF 25-30%, hypokinesis, restrictive physiology, mod MR, no AS  . Icd placement 07/14/11     w/pacemaker  . Tonsillectomy and adenoidectomy   . Breast biopsy     "I've had 2-3 bx; all benign"  . Augmentation mammaplasty   . 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 Abdominal hysterectomy 1987    w/BSO  . Lasik     left eye  . Knee surgery     left  . Mandible surgery     reports that she has never smoked. She has never used smokeless tobacco. She reports that she does not drink alcohol or use illicit drugs. family history includes Alcohol abuse in her paternal grandfather; Arthritis in her mother; Dementia in her mother; Emphysema in her mother; Heart disease in some unspecified family members; Hypertension in her maternal grandfather; Lung cancer in her father; Osteoporosis in her mother; and Stroke in her mother and paternal grandfather. Allergies  Allergen Reactions  . Antihistamines, Chlorpheniramine-Type Other (See Comments)    Makes her eyes look like she sees strobe lights  . Influenza Vac Split (Flu Virus Vaccine) Other (See Comments)    "allergic to concentrated eggs that are in vaccine"  . Penicillins     REACTION: "told 50 years ago I couldn't take it; might be immune to it", able to take amoxicillin  . Sulfasalazine Other (See Comments)    "makes my whole body smell like sulfa & makes me sick just smelling it"  . Atorvastatin Other (See Comments)    Severe muscle cramps to generic atorvastatin.  Able to take brand lipitor.  . Latex Nausea Only and Rash  . Rosuvastatin Other (See Comments)    Was not effective controlling lipids  . Red Dye     Nausea, swelling        Review of Systems: Denies heartburn dysphagia she has occasional chest pain  The remainder of the 10 point ROS is negative except as outlined in H&P   Physical Exam: General appearance  Well developed, in no distress. Eyes- non icteric. HEENT nontraumatic, normocephalic. Mouth no lesions, tongue papillated, no cheilosis. Neck supple without  adenopathy, thyroid not enlarged, no carotid bruits, no JVD. Lungs Clear to auscultation bilaterally. Faint inspiratory rales Cor normal S1, normal S2, regular rhythm, no murmur,  quiet precordium.ISD , Abdomen: Soft with minimal tenderness in epigastrium. Normal active bowel sounds no distention, liver edge at costal margin. Rectal: Not done. Extremities trace pedal edema. Skin no lesions. Neurological alert and oriented x 3. Psychological normal mood and affect.  Assessment and Plan:  Problem #50 67 year old white female with severe cardiomyopathy and a decreased ejection fraction who  has gastroesophageal reflux which is currently under good control with a proton pump inhibitor. She is not a  candidate for what would be normally an elective procedure to control her acid reflux. For that reason, I don't see any pressing need for an upper endoscopy. To r/o Barrett's esophagus ,we would have to use propofolrisking cardiac decompensation. Patient herself is not interested in any procedures that may be potentially life-threatening. She will continue on Prilosec 40 mg twice a day and strict antireflux measures which include the head of the bed elevation at night. She will let us know next month if she is interested in an upper endoscopy and biopsies. She will likely need a repeat echocardiogram to update her ejection fraction before scheduling an upper endoscopy which would be done in hospital setting with propofol.   04/24/2012 [name]

## 2012-04-24 NOTE — Patient Instructions (Addendum)
CC: Dr [name]

## 2012-04-24 NOTE — Telephone Encounter (Signed)
Patient is asking if getting off gluten will help her condition.Please, advise.

## 2012-04-24 NOTE — Telephone Encounter (Signed)
Patient called and said she would like the ortho referral if you feel that is the route she needs to take for her shoulder. She doesn't feel it is worse since restarting the pravastatin, but she said it hurts to have a long sleeved shirt and jacket on, so that is why she thought possibly neuro. She will let you make the determination.

## 2012-04-25 NOTE — Telephone Encounter (Signed)
Left a message for patient to call me. 

## 2012-04-25 NOTE — Telephone Encounter (Signed)
have placed referral in chart.  plz send note from 03/05/2012

## 2012-04-26 NOTE — Telephone Encounter (Signed)
Spoke with patient and she prefers to have labs drawn at [facility].  She will go next week. Per Mickie Bail at [facility] patient can have labs done there if it is in the computer. Labs in EPIC.

## 2012-04-26 NOTE — Telephone Encounter (Signed)
Per Dr. Juanda Chance, order sprue profile. Labs in EPIC. Left a message for patient to call me.

## 2012-04-26 NOTE — Telephone Encounter (Signed)
Appt made and pt aware

## 2012-04-29 DIAGNOSIS — R05 Cough: Secondary | ICD-10-CM | POA: Diagnosis not present

## 2012-04-29 DIAGNOSIS — R059 Cough, unspecified: Secondary | ICD-10-CM | POA: Diagnosis not present

## 2012-04-30 ENCOUNTER — Other Ambulatory Visit (INDEPENDENT_AMBULATORY_CARE_PROVIDER_SITE_OTHER): Payer: Medicare Other

## 2012-04-30 DIAGNOSIS — K219 Gastro-esophageal reflux disease without esophagitis: Secondary | ICD-10-CM | POA: Diagnosis not present

## 2012-04-30 DIAGNOSIS — R109 Unspecified abdominal pain: Secondary | ICD-10-CM

## 2012-05-01 DIAGNOSIS — H2510 Age-related nuclear cataract, unspecified eye: Secondary | ICD-10-CM | POA: Diagnosis not present

## 2012-05-01 LAB — CELIAC PANEL 10
Endomysial Screen: NEGATIVE
Gliadin IgA: 4.7 U/mL (ref <20)
Gliadin IgG: 2.2 U/mL (ref <20)
IgA: 205 mg/dL (ref 69–380)
Tissue Transglut Ab: 4.5 U/mL (ref <20)
Tissue Transglutaminase Ab, IgA: 3.9 U/mL (ref <20)

## 2012-05-02 ENCOUNTER — Telehealth: Payer: Self-pay | Admitting: Internal Medicine

## 2012-05-02 ENCOUNTER — Telehealth: Payer: Self-pay | Admitting: Cardiovascular Disease

## 2012-05-02 NOTE — Telephone Encounter (Signed)
spoke with patient and let her know that once she sees the MD and he decides what he is going to do about her shoulder then he can call Dr Ladona Ridgel

## 2012-05-02 NOTE — Telephone Encounter (Signed)
Pt states she is having shoulder pain within an inch of where her pacemaker is located.  She wants to know if she can handle a shot of cortizone with Dr. Rennis Chris with [facility] on December 30th.  Please call to advise.

## 2012-05-02 NOTE — Telephone Encounter (Signed)
Is this ok?

## 2012-05-02 NOTE — Telephone Encounter (Signed)
New problem:   Can she get a cortisone shot.  Right beside the pace/ defib site.

## 2012-05-06 ENCOUNTER — Other Ambulatory Visit: Payer: Self-pay | Admitting: Cardiovascular Disease

## 2012-05-06 MED ORDER — CARVEDILOL 12.5 MG PO TABS: 18.7500 mg | ORAL_TABLET | Freq: Every day | ORAL | Status: DC

## 2012-05-06 NOTE — Telephone Encounter (Signed)
[facility] the dosage changed out her last appointment and she has run out.  They need a new prescription with the new dose.[facility] [employer] [facility].

## 2012-05-09 ENCOUNTER — Other Ambulatory Visit: Payer: Self-pay

## 2012-05-09 MED ORDER — HYDRALAZINE HCL 25 MG PO TABS: ORAL_TABLET | ORAL | Status: DC

## 2012-05-13 DIAGNOSIS — M25819 Other specified joint disorders, unspecified shoulder: Secondary | ICD-10-CM | POA: Diagnosis not present

## 2012-05-23 ENCOUNTER — Encounter: Payer: Self-pay | Admitting: *Deleted

## 2012-06-03 ENCOUNTER — Telehealth: Payer: Self-pay | Admitting: *Deleted

## 2012-06-03 NOTE — Telephone Encounter (Signed)
[place] that she was in [place] and she got sick with a cold. 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 Her bp 101/57 pulse 115. Pt got down to 148. Pt been sneezing and coughing also. Pt says she ' at like a horse' on ship and still lost weight. Wants to know if she needs to come in and have labs.

## 2012-06-04 ENCOUNTER — Other Ambulatory Visit: Payer: Self-pay

## 2012-06-04 DIAGNOSIS — R609 Edema, unspecified: Secondary | ICD-10-CM

## 2012-06-04 DIAGNOSIS — I509 Heart failure, unspecified: Secondary | ICD-10-CM

## 2012-06-04 DIAGNOSIS — R0602 Shortness of breath: Secondary | ICD-10-CM

## 2012-06-04 NOTE — Telephone Encounter (Signed)
Pt says she has been [place] a [place] 2 [place] and has been "sick the whole time" Has had fever, sore throat, cramps in "butt and legs" and thinks she is in "renal failure" Says Dr. Mariah Milling told her "when your weight gets to 148 [pounds you are in the beginning of renal failure" I explained we would need labs to confirm this She is still in [place] today but will be getting back home tonight at [place] Appt made for BMP and BNP tomm at 1000 I then advised her to see PCP as well when she gets back d/t fever, sore throat and aches.  I had her hold while I called PCP. He can see her tomm at 1215 She was informed and will just have labs checked at this time

## 2012-06-05 ENCOUNTER — Encounter: Payer: Self-pay | Admitting: Family Medicine

## 2012-06-05 ENCOUNTER — Ambulatory Visit (INDEPENDENT_AMBULATORY_CARE_PROVIDER_SITE_OTHER): Payer: Medicare Other | Admitting: Family Medicine

## 2012-06-05 VITALS — BP 108/74 | HR 84 | Temp 97.8°F | Wt 154.2 lb

## 2012-06-05 DIAGNOSIS — I509 Heart failure, unspecified: Secondary | ICD-10-CM

## 2012-06-05 DIAGNOSIS — R0602 Shortness of breath: Secondary | ICD-10-CM

## 2012-06-05 DIAGNOSIS — R059 Cough, unspecified: Secondary | ICD-10-CM | POA: Diagnosis not present

## 2012-06-05 DIAGNOSIS — R05 Cough: Secondary | ICD-10-CM | POA: Diagnosis not present

## 2012-06-05 DIAGNOSIS — I5023 Acute on chronic systolic (congestive) heart failure: Secondary | ICD-10-CM

## 2012-06-05 LAB — BASIC METABOLIC PANEL
BUN: 30 mg/dL — ABNORMAL HIGH (ref 6–23)
CO2: 27 mEq/L (ref 19–32)
Calcium: 9.8 mg/dL (ref 8.4–10.5)
Chloride: 101 mEq/L (ref 96–112)
Creatinine, Ser: 1.1 mg/dL (ref 0.4–1.2)
GFR: 50.55 mL/min — ABNORMAL LOW (ref >60.00)
Glucose, Bld: 95 mg/dL (ref 70–99)
Potassium: 4.3 mEq/L (ref 3.5–5.1)
Sodium: 139 mEq/L (ref 135–145)

## 2012-06-05 LAB — BRAIN NATRIURETIC PEPTIDE: Pro B Natriuretic peptide (BNP): 9 pg/mL (ref 0.0–100.0)

## 2012-06-05 MED ORDER — AZITHROMYCIN 250 MG PO TABS: ORAL_TABLET | ORAL | Status: DC

## 2012-06-05 NOTE — Patient Instructions (Addendum)
Take second course of azithromycin - sent to pharmacy. Use cheratussin at night time for cough - every night. Ensure staying well hydrated. Lungs sounding well today. Lots of rest over next several days. Good to see you today, call us with questions.

## 2012-06-05 NOTE — Assessment & Plan Note (Signed)
Check BNP, BMP per cards today. Goal weight 150-153lbs.

## 2012-06-05 NOTE — Progress Notes (Addendum)
  Subjective:    Patient ID: Ashley [name], female    DOB: 06-06-1945, 67 y.o.   MRN: [id number]  HPI CC: ST, cough  Ms Wojdyla presents with several week history of congestion, cough productive of clear to green phlegm, sneezing, HA.  Diarrhea x1.  No recent fevers/chills.  + mild occasional HA.  Feels cough worsening. Has self treated with levaquin 4d course (05/19/2012), then treated with zpack (05/24/2012) during cruise.  No abd pain, rashes, ear or tooth pain. Has been taking delsym DM as well as codeine cough medicine. H/o mild bronchiectasis, rec treat URIs aggressively.  Returned from recent cruise last night.  Chronic sCHF: weight has been stable.  No SOB or leg swelling. Wt Readings from Last 3 Encounters:  06/05/12 154 lb 4 oz (69.967 kg)  04/24/12 153 lb 6 oz (69.57 kg)  04/09/12 155 lb (70.308 kg)    Past Medical History  Diagnosis Date  . Migraine without aura, without mention of intractable migraine without mention of status migrainosus   . Diverticulosis of colon     no diverticulitis  . Pure hypercholesterolemia   . Irritable bowel syndrome   . Laryngeal nodule     Dr. Thelma Comp  . Fibrocystic breast disease   . Osteoarthritis   . Hemorrhoids   . Anxiety   . GERD (gastroesophageal reflux disease)   . Systolic CHF     EF 25-35%, LV dilation, significant MR, goal weight 150lbs  . Nonischemic cardiomyopathy   . Pneumonia 2012  . Mitral regurgitation   . Anxiety   . Pleural effusion, bilateral 2012  . Bronchiectasis     mild, treat URIs aggressively  . Hypertension   . Heart murmur   . Angina   . Lung disease 07/14/11  . Sleep apnea   . Shortness of breath     "resting"  . Anemia   . Fibromyalgia   . Depression   . Disorder of vocal cords   . COPD (chronic obstructive pulmonary disease)   . Chronic sinusitis      Review of Systems Per HPI    Objective:   Physical Exam  Nursing note and vitals reviewed. Constitutional: She appears  well-developed and well-nourished. No distress.  HENT:  Head: Normocephalic and atraumatic.  Right Ear: Hearing, tympanic membrane, external ear and ear canal normal.  Left Ear: Hearing, tympanic membrane, external ear and ear canal normal.  Nose: Rhinorrhea present. No mucosal edema. Right sinus exhibits no maxillary sinus tenderness and no frontal sinus tenderness. Left sinus exhibits no maxillary sinus tenderness and no frontal sinus tenderness.  Mouth/Throat: Uvula is midline and mucous membranes are normal. No oropharyngeal exudate, posterior oropharyngeal edema or tonsillar abscesses.  Eyes: Conjunctivae normal and EOM are normal. Pupils are equal, round, and reactive to light. No scleral icterus.  Neck: Normal range of motion. Neck supple. No JVD present.  Cardiovascular: Normal rate, regular rhythm, normal heart sounds and intact distal pulses.   No murmur heard. Pulmonary/Chest: Effort normal and breath sounds normal. No respiratory distress. She has no wheezes. She has no rales.       Lungs clear  Musculoskeletal: She exhibits no edema.  Lymphadenopathy:    She has no cervical adenopathy.  Skin: Skin is warm and dry. No rash noted.   Late entry physical exam    Assessment & Plan:

## 2012-06-05 NOTE — Assessment & Plan Note (Signed)
?   Persistent bronchitis.  In h/o bronchiectasis - will treat aggressively with 2nd course of zpack.  Has initially self treated with levaquin then had course of azithromycin. No evidence of CHF exacerbation, overall euvolemic on exam today.

## 2012-06-05 NOTE — Addendum Note (Signed)
Addended by: Baldomero Lamy on: 06/05/2012 01:23 PM   Modules accepted: Orders

## 2012-06-06 ENCOUNTER — Telehealth: Payer: Self-pay

## 2012-06-06 ENCOUNTER — Telehealth: Payer: Self-pay | Admitting: Pulmonary Disease

## 2012-06-06 DIAGNOSIS — M999 Biomechanical lesion, unspecified: Secondary | ICD-10-CM | POA: Diagnosis not present

## 2012-06-06 DIAGNOSIS — M5137 Other intervertebral disc degeneration, lumbosacral region: Secondary | ICD-10-CM | POA: Diagnosis not present

## 2012-06-06 DIAGNOSIS — IMO0002 Reserved for concepts with insufficient information to code with codable children: Secondary | ICD-10-CM | POA: Diagnosis not present

## 2012-06-06 NOTE — Telephone Encounter (Signed)
I spoke with pt. She stated she was in [place] and was sick and had to take Levaquin she had on hand. Her "taste buds were open and breathing" and "ulcers on top of them", running high fever, chills, sweats, coughing like a "lumber jack". Pt went and saw her PCP yesterday and was giving another ZPAK and told to take cheratussin cough syrup. Pt c/o cough w/ chunks of green phlem, wheezing from her "voice box", nasal congestion, PND since beginning of jan in when all this started. 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 Since BQ is on night float will forward to doc of the day. Please advise Dr. Maple Hudson thanks Last OV 04/09/12--told to f/u in April 2014 Allergies  Allergen Reactions  . Antihistamines, Chlorpheniramine-Type Other (See Comments)    Makes her eyes look like she sees strobe lights  . Influenza Vac Split (Flu Virus Vaccine) Other (See Comments)    "allergic to concentrated eggs that are in vaccine"  . Penicillins     REACTION: "told 50 years ago I couldn't take it; might be immune to it", able to take amoxicillin  . Sulfasalazine Other (See Comments)    "makes my whole body smell like sulfa & makes me sick just smelling it"  . Atorvastatin Other (See Comments)    Severe muscle cramps to generic atorvastatin.  Able to take brand lipitor.  . Latex Nausea Only and Rash  . Rosuvastatin Other (See Comments)    Was not effective controlling lipids  . Red Dye     Nausea, swelling

## 2012-06-06 NOTE — Telephone Encounter (Signed)
Pt left v/m pt was seen 06/05/12 pt forgot to tell Dr Sharen Hones that pt has voice box disfunction and that is what is wheezing. Pt wants to know if voice box can swell and cause breathing problems. Pt is also waiting on rush lab results to see if has renal failure or if pt needs to see cardiologist.Please advise.

## 2012-06-06 NOTE — Telephone Encounter (Signed)
Per CY-have patient take her Zpak on hand and make earlier appt with BQ; pt aware and transferred to schedulers to make appt.

## 2012-06-06 NOTE — Telephone Encounter (Signed)
I didn't think she had swelling of voicebox yesterday when examining her. Still waiting blood work results.

## 2012-06-07 NOTE — Telephone Encounter (Signed)
Looks like blood work went directly to cards - plz notify blood work looking good - kidneys stable, potassium normal, no evidence of CHF exac.  Continue treatment plan as up to now.

## 2012-06-07 NOTE — Telephone Encounter (Signed)
Patient notified. She said that she isn't wheezing now, but made an appt with Dr. Kendrick Fries for next week to just have things checked to be sure everything is okay. She asked that I cancel her 2 month follow up here on the same day. Appt cancelled.

## 2012-06-11 ENCOUNTER — Encounter: Payer: Self-pay | Admitting: Pulmonary Disease

## 2012-06-11 ENCOUNTER — Ambulatory Visit (INDEPENDENT_AMBULATORY_CARE_PROVIDER_SITE_OTHER)
Admission: RE | Admit: 2012-06-11 | Discharge: 2012-06-11 | Disposition: A | Discharge status: Home / Self Care | Payer: Medicare Other | Source: Ambulatory Visit | Attending: Pulmonary Disease | Admitting: Pulmonary Disease

## 2012-06-11 ENCOUNTER — Ambulatory Visit: Payer: Medicare Other | Admitting: Family Medicine

## 2012-06-11 ENCOUNTER — Ambulatory Visit (INDEPENDENT_AMBULATORY_CARE_PROVIDER_SITE_OTHER): Payer: Medicare Other | Admitting: Pulmonary Disease

## 2012-06-11 VITALS — BP 122/62 | HR 98 | Temp 98.7°F | Ht 65.0 in | Wt 154.0 lb

## 2012-06-11 DIAGNOSIS — R059 Cough, unspecified: Secondary | ICD-10-CM | POA: Diagnosis not present

## 2012-06-11 DIAGNOSIS — R05 Cough: Secondary | ICD-10-CM | POA: Diagnosis not present

## 2012-06-11 MED ORDER — SACCHAROMYCES BOULARDII 250 MG PO CAPS: 250.0000 mg | ORAL_CAPSULE | Freq: Two times a day (BID) | ORAL | Status: DC

## 2012-06-11 MED ORDER — GUAIFENESIN-CODEINE 100-10 MG/5ML PO SYRP: 5.0000 mL | ORAL_SOLUTION | Freq: Three times a day (TID) | ORAL | Status: DC | PRN

## 2012-06-11 MED ORDER — DOXYCYCLINE HYCLATE 100 MG PO TABS: 100.0000 mg | ORAL_TABLET | Freq: Two times a day (BID) | ORAL | Status: AC

## 2012-06-11 NOTE — Assessment & Plan Note (Addendum)
Ms. Klomp is experiencing what sounds like an upper respiratory infection with sinus drainage, laryngitis, and recurrent cough. She does not believe that the phlegm she is producing is coming from her lungs but rather she feels like it's more coming from her sinuses and throat. Her pulmonary exam is clear which is encouraging. In the past and she has to cart several weeks of antibiotics before improving which I believe is do to sinus infections. She has never mentioned to have convincing pulmonary pathology.  At this point I think we need to focus on treating what sounds like an ongoing sinus infection (as she still has green mucus production), and focus on cough suppression and therapy for her sore throat.  She needs a chest x-ray to evaluate for pneumonia but given her lack of fever or abnormal findings on exam I think that this is unlikely.  Plan: -Doxycycline 100 mg by mouth twice a day x14 days -Probiotic with the antibiotic -Saline sprays (she cannot tolerate rinses) -Phenylephrine as needed as a nasal decongestant -Chest x-ray today to rule out pneumonia -After 3-4 days of antibiotics I've asked her to start using the codeine, Delsym, and hard candies to help suppress her cough. -She is going to use warm tea with honey to also help soothe her sore throat. -Followup with [place] in 2 months

## 2012-06-11 NOTE — Progress Notes (Signed)
Subjective:    Patient ID: Ashley [name], female    DOB: 05/01/1946, 67 y.o.   MRN: [id number]  Synopsis: 67 y/o female with chronic cough, intermittent wheezing, and multiple hospitalizations for pneumonia presented to our clinic initially on 02/20/11 for evaluation of the same. She has seen an allergist for years for allergic rhinitis and had been told that she did not have asthma. We ordered an echocardiogram which showed significanly decreased LV function. She underwent a cardiac myoview which showed no evidence of ischaemia. She had a negative metacholine challenge test in 07/2009.  She was admitted in 04/2011 for volume overload.  A CT scan during that time showed volume overload and a pulmonary nodule.  There were [facility] CT chest reads suggestive of bronchiectasis but I (McQuaid) have never been convinced neither have [facility] and [employer].  She was seen by Dr. Thana Ates in 09/2011 for recurrent cough.  This was felt to be due to GERD and a Nissen Fundoplication has been considered.     Cough Associated symptoms include chills, postnasal drip, a sore throat and shortness of breath. Pertinent negatives include no chest pain or wheezing.    04/03/11 ROV --Ashley [name] has had a busy 6 weeks since our last visit as detailed above. She has multiple complaints today, but her primary respiratory complaints are wheezing, shortness of breath, and continuous cough productive of clear sputum. She says that the symptoms are worse at night when she lies flat, so she has to prop her head up. She is a little more swollen than usual. She says that the tramadol makes her stomach hurt. She doesn't think that the pepcid helps. Her allergist put her on Asthmanex but she doesn't think that it has helped. She was put on a prednisone taper last week and she says that this has not helped her wheeze or shortness of breath at all. She wheezes nearly continuously. No fevers or chills.  She notes that she  frequently wakes up many times during the night choking and gasping for air. Her husband says that she stops breathing at times.  She also notes waking up choking frequently in the middle of the night.  Her sinuses have been well controlled recently.   04/26/11 ROV -- Ashley [name] returns for further evaluation of shortness of breath and cough. She states that she has had chills at home in the last week, associated with sorethroat, cough, wheezing (especially at night), clear sputum production, and chest pain with cough. She says that she has woken up multiple times at night with wheezing which is relieved with albuterol.  She stopped taking the bystolic and feels better now (less short of breath). She has also been taking more furosemide per Dr. Windell Hummingbird instructions.   05/24/11 ROV/Hosp f/u-- She reports that she is doing fairly well after her recent hospitalization for volume overload and cough. Her cough has improved quite a bit since then and she is not as short of breath since starting on oxygen. She only has scant clear sputum production. She notes that she does not urinate much on her current dose of lasix. She is taking the lasix and metolazone as written. She stopped taking the alvesco and nebulized meds.  07/31/2011 ROV-- She is doing fairly well. She has some green sputum and sinus congestion on occasion, worse in the last three weeks.  No fevers or chills or change in baseline dyspnea.  For the most part she feels her breathing has improved. She  has had some eratic blood pressures at home last week.   She is adjusting her lasix for a goal weight of 150 lbs.   10/26/2011 ROV -- Ashley [name] is doing pretty well.  Her energy has been up and down but overall she is better.  She is going to pulm rehab Mon and Friday.  She apparently recently stopped two of her blood pressure meds. She does not complain of dyspnea but she notes some fatigue and leg cramps with a lot of stiffness.  She still has  occasional coughing paroxysms. She thinks that her cough is better while using the continuous O2.  She notes nose bleeding from the oxygen use.    03/2012 ROV -- Ashley [name] returns to clinic today. She's had a rough go of it the last several months with an episode of diverticulitis. Fortunately cough has not been a significant problem in the last several months. She states that it is at baseline and continues to bother her on a daily basis but it has been the least of her worries. She has had some issues with both acute renal failure and volume overload which are now improving after Dr. Mariah Milling and Sharen Hones adjusted her diuretics. She is not short of breath and her sinus disease is fairly well controlled.  06/11/2012 ROV -- Since November Ashley [name] has had near continuous symptoms of sinus congestion and cough. She traveled to [place] in November and had sore throat, an exacerbation of her CHF and apparently some renal failure. She saw Dr. Mariah Milling and he adjusted her meds and the volume overload and renal failure improved. On January 4 she wants to just do for your use and again developed sinus congestion and cough. She was treated with Levaquin and Z-Pak at this point. She returned and saw Dr. Sharen Hones in mid January and was treated again with a Z-Pak. She tells me today that she still has ongoing sinus congestion, green sputum, green mucus production, and a very sore throat. She thinks that this is coming from her "voice box". She does not feel that she has chest congestion. She does not have fevers. Her shortness of breath is perhaps slightly increased. She has been sneezing a lot lately.   Past Medical History  Diagnosis Date  . Migraine without aura, without mention of intractable migraine without mention of status migrainosus   . Diverticulosis of colon     no diverticulitis  . Pure hypercholesterolemia   . Irritable bowel syndrome   . Laryngeal nodule     Dr. Thelma Comp  . Fibrocystic breast  disease   . Osteoarthritis   . Hemorrhoids   . Anxiety   . GERD (gastroesophageal reflux disease)   . Systolic CHF     EF 25-35%, LV dilation, significant MR, goal weight 150lbs  . Nonischemic cardiomyopathy   . Pneumonia 2012  . Mitral regurgitation   . Anxiety   . Pleural effusion, bilateral 2012  . Bronchiectasis     mild, treat URIs aggressively  . Hypertension   . Heart murmur   . Angina   . Lung disease 07/14/11  . Sleep apnea   . Shortness of breath     "resting"  . Anemia   . Fibromyalgia   . Depression   . Disorder of vocal cords   . COPD (chronic obstructive pulmonary disease)   . Chronic sinusitis       Review of Systems  Constitutional: Positive for chills. Negative for activity change, appetite change and fatigue.  HENT: Positive for nosebleeds, congestion, sore throat and postnasal drip.   Respiratory: Positive for cough and shortness of breath. Negative for choking, chest tightness and wheezing.   Cardiovascular: Negative for chest pain, palpitations and leg swelling.       Objective:   Physical Exam  Filed Vitals:   06/11/12 1111  BP: 122/62  Pulse: 98  Temp: 98.7 F (37.1 C)  TempSrc: Oral  Height: 5\' 5"  (1.651 m)  Weight: 154 lb (69.854 kg)  SpO2: 97%   Gen:  Lying on exam table but no acute distress HEENT: NCAT, PERRL, EOMi, OP clear, neck supple without masses PULM: CTA B CV: RRR, no mgr, no JVD Ext: warm, no edema, no clubbing, no cyanosis  12/14 /12 CT Thorax: mediastinal lymphadenopathy, question right hilar mass, mild bronchiectasis in bases, pleural effusion with increased interlobular thickening  06/12/11 CT thorax: IMPRESSION:  Interval clearing of congestive heart failure and associated  reactive mediastinal and bihilar adenopathy. No evidence of  interstitial lung disease.     Assessment & Plan:   Cough Ashley [name] is experiencing what sounds like an upper respiratory infection with sinus drainage, laryngitis, and  recurrent cough. She does not believe that the phlegm she is producing is coming from her lungs but rather she feels like it's more coming from her sinuses and throat. Her pulmonary exam is clear which is encouraging. In the past and she has to cart several weeks of antibiotics before improving which I believe is do to sinus infections. She has never mentioned to have convincing pulmonary pathology.  At this point I think we need to focus on treating what sounds like an ongoing sinus infection (as she still has green mucus production), and focus on cough suppression and therapy for her sore throat.  She needs a chest x-ray to evaluate for pneumonia but given her lack of fever or abnormal findings on exam I think that this is unlikely.  Plan: -Doxycycline 100 mg by mouth twice a day x14 days -Probiotic with the antibiotic -Saline sprays (she cannot tolerate rinses) -Phenylephrine as needed as a nasal decongestant -Chest x-ray today to rule out pneumonia -After 3-4 days of antibiotics I've asked her to start using the codeine, Delsym, and hard candies to help suppress her cough. -She is going to use warm tea with honey to also help soothe her sore throat. -Followup with [place] in 2 months  Cardiomyopathy Her weight is at baseline today and she does not appear grossly volume overloaded. At least for today it seems like she is compliant with her medications. No changes recommended today.    Updated Medication List Outpatient Encounter Prescriptions as of 06/11/2012  Medication Sig Dispense Refill  . ALPRAZolam (XANAX) 0.25 MG tablet TAKE 1 TABLET BY MOUTH TWICE A DAY  60 tablet  0  . aspirin 81 MG EC tablet Take 81 mg by mouth at bedtime.       Marland Kitchen CALCIUM-VITAMIN D PO Take 1 capsule by mouth daily.      . carvedilol (COREG) 12.5 MG tablet Take 1.5 tablets (18.75 mg total) by mouth daily. Take 1 and a half tablets daily  45 tablet  6  . Coenzyme Q10 (COQ-10) 150 MG CAPS Take 1 tablet by mouth daily.       Marland Kitchen dextromethorphan (DELSYM) 30 MG/5ML liquid Take 60 mg by mouth every 12 (twelve) hours as needed. For cough/congestion.      . hydrALAZINE (APRESOLINE) 25 MG tablet Takes 0.5 tablet daily.  45 tablet  3  . metolazone (ZAROXOLYN) 2.5 MG tablet Take 1 tablet (2.5 mg total) by mouth every other day. Take one tablet every other day before torsemide  45 tablet  3  . mometasone (NASONEX) 50 MCG/ACT nasal spray Place 1 spray into the nose 2 (two) times daily.       Marland Kitchen omeprazole (PRILOSEC) 40 MG capsule Take 40 mg by mouth 2 (two) times daily.      . potassium chloride (K-DUR) 10 MEQ tablet Take 1-4 tablets (10-40 mEq total) by mouth daily as needed. for every 10mg  toresmide  120 tablet  11  . pravastatin (PRAVACHOL) 80 MG tablet Take 80 mg by mouth every 3 (three) days.      Marland Kitchen torsemide (DEMADEX) 10 MG tablet 1-4 tablets daily depending on weight (goal 150-153lbs)      . vitamin B-12 (CYANOCOBALAMIN) 500 MCG tablet Take 1 tablet (500 mcg total) by mouth daily.      . vitamin C (ASCORBIC ACID) 500 MG tablet Take 500 mg by mouth 2 (two) times daily.       Marland Kitchen zolpidem (AMBIEN) 5 MG tablet Take 2.5-5 mg by mouth at bedtime as needed. For insomnia       . [DISCONTINUED] azithromycin (ZITHROMAX Z-PAK) 250 MG tablet Two on day 1 followed by one daily for 4 days for total of 5 days, PO  6 each  0

## 2012-06-11 NOTE — Assessment & Plan Note (Signed)
Her weight is at baseline today and she does not appear grossly volume overloaded. At least for today it seems like she is compliant with her medications. No changes recommended today.

## 2012-06-11 NOTE — Patient Instructions (Signed)
Take the doxycycline 100mg  by mouth for fourteen days Take the florastor with the antibiotic Use the codeine cough syrup along with hard candies and dextromethrophan (delsym) to suppress your cough.  Gradually cut back on the codeine after about 2-3 days Be carefull to not take extra guaifenesin while you are on the codeine cough syrup (pay attention to what is in your over the counter drugs) Use saline sprays and phenyelphrine along with your flonase for decongestants. We will send you for a chest x-ray at [facility] and call you with the results If you develop progressive shortness of breath or fevers then you should be evaluated by a physician We will see you back in 2 months or sooner if needed

## 2012-06-13 DIAGNOSIS — IMO0002 Reserved for concepts with insufficient information to code with codable children: Secondary | ICD-10-CM | POA: Diagnosis not present

## 2012-06-13 DIAGNOSIS — M5137 Other intervertebral disc degeneration, lumbosacral region: Secondary | ICD-10-CM | POA: Diagnosis not present

## 2012-06-13 DIAGNOSIS — M999 Biomechanical lesion, unspecified: Secondary | ICD-10-CM | POA: Diagnosis not present

## 2012-06-17 ENCOUNTER — Telehealth: Payer: Self-pay | Admitting: *Deleted

## 2012-06-17 ENCOUNTER — Encounter: Payer: Self-pay | Admitting: Family Medicine

## 2012-06-17 ENCOUNTER — Ambulatory Visit (INDEPENDENT_AMBULATORY_CARE_PROVIDER_SITE_OTHER): Payer: Medicare Other | Admitting: Family Medicine

## 2012-06-17 VITALS — BP 94/62 | HR 80 | Temp 97.7°F | Wt 152.8 lb

## 2012-06-17 DIAGNOSIS — J329 Chronic sinusitis, unspecified: Secondary | ICD-10-CM | POA: Diagnosis not present

## 2012-06-17 NOTE — Telephone Encounter (Signed)
Pt calling wants to know if she needs to get another echo before a year. Had one 01/16/12. Pt states shes been sick and thinks meds and sickness is affecting her heart

## 2012-06-17 NOTE — Telephone Encounter (Signed)
I think we will have trouble qualifying her for a echocardiogram this soon Last one was only 4 months ago Is she more short of breath? Does she need more diuretic for symptoms?

## 2012-06-17 NOTE — Telephone Encounter (Signed)
Please advise See labs as well

## 2012-06-17 NOTE — Progress Notes (Signed)
Subjective:    Patient ID: Ashley [name], female    DOB: 28-Jan-1946, 67 y.o.   MRN: [id number]  HPI CC: "i'm sick"  Ashley [name] presents today with concerns she is not getting better.  On day 6/14 doxy for sinusitis - see below.  Having back pain, continued cough, R facial pain/swelling and stinging.  Endorses R facial burning for last 5 days.  No rash.  No h/o shingles.  Has not received zostavax.  R eye watering, headache pain behind R eye.  Some vomiting from cough along with some diarrhea.   Some chills, legs aching. CXR 06/11/2012 - hyperinflation without acute process.  Taking doxy as prescribed.  Also using nasonex, cough drops, nasal saline sprays, hot steamy showers.  Delsym DM during day, codeine cough syrup at night.  Denies fevers, tooth pain.  Recent treatment with levaquin, zpack x2 (05/2012) for presumed bronchitis/sinusitis. Since last seen here, evaluation by Dr. Kendrick Fries 06/11/2012 with concern for sinus infection leading to recurrent and persistent sxs, treated with 10d doxycycline along with florastor and codeine cough syrup.  sCHF - goal weight 150-153lbs.  Stable today.  Compliant with meds.   Wt Readings from Last 3 Encounters:  06/17/12 152 lb 12 oz (69.287 kg)  06/11/12 154 lb (69.854 kg)  06/05/12 154 lb 4 oz (69.967 kg)   She does endorse staying well hydrated over last several days.  Past Medical History  Diagnosis Date  . Migraine without aura, without mention of intractable migraine without mention of status migrainosus   . Diverticulosis of colon     no diverticulitis  . Pure hypercholesterolemia   . Irritable bowel syndrome   . Laryngeal nodule     Dr. Thelma Comp  . Fibrocystic breast disease   . Osteoarthritis   . Hemorrhoids   . Anxiety   . GERD (gastroesophageal reflux disease)   . Systolic CHF     EF 25-35%, LV dilation, significant MR, goal weight 150lbs  . Nonischemic cardiomyopathy   . Pneumonia 2012  . Mitral regurgitation   . Anxiety    . Pleural effusion, bilateral 2012  . Bronchiectasis     mild, treat URIs aggressively  . Hypertension   . Heart murmur   . Angina   . Lung disease 07/14/11  . Sleep apnea   . Shortness of breath     "resting"  . Anemia   . Fibromyalgia   . Depression   . Disorder of vocal cords   . COPD (chronic obstructive pulmonary disease)   . Chronic sinusitis     Past Surgical History  Procedure Date  . Appendectomy   . Mandible surgery     "got about 6 pins in the bottom of my jaw"  . Knee arthroscopy w/ orif     Left; "meniscus tear"  . Osteotomy     Left foot  . 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 Dexa 03/28/99    osteopenia L/S  . Colonoscopy 1998    nml (sigmoid-Gilbert-nol)  . Head mri, head ct 10/1998    ? H/A focus ([facility])  . Emg/mcv 10/16/01    + mild carpal tunnel  . Dexa 03/12/03    2.2 Spine 0.1 Hip  Osteopenia  . Mri right hip 12/12/01    Tendonitis Gluteus Medius to Gtr Trochanter Neg Bursitis (Dr. Montez Morita)  . L/s films 11/21/01    nml; Right hip nml  . Mri u/s 11/21/01    min. DDD L/3-4  .  Colonoscopy 01/04/04    polyps, diverticulosis/severe  . Dexa 11/14/05    1.9 Spine 0.3 Hip  slight improvement  . Flex laryngoscopy 06/11/06    (Juengel) nml  . Dexa 11/22/06    improved osteo and fem neck  . [employer] bunionectomy 11/04/08    Right Great Toe (Dr. Lestine Box)  . Breast mass excision 01/2010    Left-fibrocystic change w/intraductal papilloma, no malignancy  . Ct chest 12/2010    possible bronchiectasis lower lobes, 02/2011 - enlarged bilateral effusions, bibasilar atx  . Stress myoview 02/2011    no significant ischemia  . Ct chest 04/2011    no PE.  abnormal R hilar and mediastinal adenopathy --> on rpt 05/2011, resolved  . US echocardiography 04/2011    mildly dilated LV, EF 25-30%, hypokinesis, restrictive physiology, mod MR, no AS  . 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 Tonsillectomy and adenoidectomy   . Breast biopsy     "I've had 2-3 bx; all benign"   . Augmentation mammaplasty   . 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 Abdominal hysterectomy 1987    w/BSO  . Lasik     left eye  . Knee surgery     left  . Mandible surgery     Review of Systems Per HPI    Objective:   Physical Exam  Nursing note and vitals reviewed. Constitutional: She appears well-developed and well-nourished. No distress.       Tired appearing but nontoxic  HENT:  Head: Normocephalic and atraumatic.  Right Ear: Hearing, tympanic membrane, external ear and ear canal normal.  Left Ear: Hearing, tympanic membrane, external ear and ear canal normal.  Nose: No mucosal edema or rhinorrhea. Right sinus exhibits maxillary sinus tenderness and frontal sinus tenderness. Left sinus exhibits no maxillary sinus tenderness and no frontal sinus tenderness.  Mouth/Throat: Uvula is midline, oropharynx is clear and moist and mucous membranes are normal. No oropharyngeal exudate, posterior oropharyngeal edema, posterior oropharyngeal erythema or tonsillar abscesses.       Irritated nasal mucosa. No facial swelling appreciated.  Eyes: Conjunctivae normal and EOM are normal. Pupils are equal, round, and reactive to light. No scleral icterus.  Neck: Normal range of motion. Neck supple.  Cardiovascular: Normal rate, regular rhythm, normal heart sounds and intact distal pulses.   No murmur heard. Pulmonary/Chest: Effort normal and breath sounds normal. No respiratory distress. She has no wheezes. She has no rales.  Musculoskeletal: She exhibits no edema.  Lymphadenopathy:    She has no cervical adenopathy.  Skin: Skin is warm and dry. No rash noted.       No vesicular or other rash on face No hyperalgesia of skin appreciated       Assessment & Plan:

## 2012-06-17 NOTE — Assessment & Plan Note (Signed)
Discussed dx, anticipated course of resolution. Continue doxy as well as all treatments as up to now, give more time. Discussed possibility of hidden varicella zoster given facial pain but pt denies vision changes and no new rashes have developed - anticipate facial pain is more a mainfestation of sinusitis. Discussed red flags to notify us immediately for eval or possible referral to ophtho.

## 2012-06-17 NOTE — Patient Instructions (Signed)
I think these symptoms are due to sinus infection. Continue course as up to now. Continue doxycycline. Make sure you are staying hydrated - maybe 1-2 more glasses of water than you're accustomed to drinking for next 1 week. Return as needed or if not improving in the next week. Watch for: new rash developing on right side of face or any viion changes - call me right away.

## 2012-06-18 NOTE — Telephone Encounter (Signed)
Pt informed She verb. 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 I advised her to make sure she is drinking plenty of water, but to monitor weight and symptoms to prevent CHF. Understanding verb I also reassured her, based on PCP note yesterday, her upper jaw pain could be r/t sinus infection and sinus pain.  i reassured her CP when she coughs is usually non cardiac related an dis more musculoskeletal. Understanding verb

## 2012-06-18 NOTE — Telephone Encounter (Signed)
Just FYI.

## 2012-06-19 ENCOUNTER — Other Ambulatory Visit: Payer: Self-pay | Admitting: Family Medicine

## 2012-06-19 DIAGNOSIS — M5137 Other intervertebral disc degeneration, lumbosacral region: Secondary | ICD-10-CM | POA: Diagnosis not present

## 2012-06-19 DIAGNOSIS — IMO0002 Reserved for concepts with insufficient information to code with codable children: Secondary | ICD-10-CM | POA: Diagnosis not present

## 2012-06-19 DIAGNOSIS — M999 Biomechanical lesion, unspecified: Secondary | ICD-10-CM | POA: Diagnosis not present

## 2012-06-19 NOTE — Telephone Encounter (Signed)
plz phone in. 

## 2012-06-19 NOTE — Telephone Encounter (Signed)
Would agree with musculoskeletal for low blood pressure, could hold torsemide

## 2012-06-20 NOTE — Telephone Encounter (Signed)
Pt informed Understanding verb 

## 2012-06-20 NOTE — Telephone Encounter (Signed)
Rx called in as directed.   

## 2012-06-24 ENCOUNTER — Telehealth: Payer: Self-pay | Admitting: Family Medicine

## 2012-06-24 DIAGNOSIS — IMO0002 Reserved for concepts with insufficient information to code with codable children: Secondary | ICD-10-CM | POA: Diagnosis not present

## 2012-06-24 DIAGNOSIS — M999 Biomechanical lesion, unspecified: Secondary | ICD-10-CM | POA: Diagnosis not present

## 2012-06-24 DIAGNOSIS — M5137 Other intervertebral disc degeneration, lumbosacral region: Secondary | ICD-10-CM | POA: Diagnosis not present

## 2012-06-24 NOTE — Telephone Encounter (Signed)
Will forward to Dr. Reece Agar for input.

## 2012-06-24 NOTE — Telephone Encounter (Signed)
Overall feeling better. Noticing some wheezing only with exertion for last 4 days, feels it comes from below. No current inhaler use.  No dx asthma. Does notice she is significantly deconditioned - today is first day she started exercising again. Advised to finish doxy course (tomorrow is last day) and update me in a few days with status, sooner if worsening. Pt agrees with plan.

## 2012-06-24 NOTE — Telephone Encounter (Signed)
Patient Information:  Caller Name: Taesha  Phone: [phone]  Patient: Ashley [name], Ashley [name]  Gender: Female  DOB: 05/01/1946  Age: 67 Years  PCP: [name] [facility])  Office Follow Up:  Does the office need to follow up with this patient?: Yes  Instructions For The Office: Pt refused triage but would like office to nurse to call her if her PCP feels she needs to be seen on the office.   Symptoms  Reason For Call & Symptoms: Patient states she is wheezing and has been on antibiotic x2 weeks.  Patient refused TRIAGE and states she wants a message sent to Dr Sharen Hones nurse so see if he feels she needs to come into the office.  Reviewed Health History In EMR: Yes  Reviewed Medications In EMR: Yes  Reviewed Allergies In EMR: Yes  Reviewed Surgeries / Procedures: Yes  Date of Onset of Symptoms: 06/10/2012  Guideline(s) Used:  Breathing Difficulty  No Protocol Available - Sick Adult  Disposition Per Guideline:   Discuss with PCP and Callback by Nurse Today  Reason For Disposition Reached:   Nursing judgment  Advice Given:  N/A  Patient Refused Recommendation:  Patient Refused Care Advice  Pt would like office nurse to call back to let her know if she needs appt.  Pt refused triage.

## 2012-07-03 DIAGNOSIS — M5137 Other intervertebral disc degeneration, lumbosacral region: Secondary | ICD-10-CM | POA: Diagnosis not present

## 2012-07-03 DIAGNOSIS — IMO0002 Reserved for concepts with insufficient information to code with codable children: Secondary | ICD-10-CM | POA: Diagnosis not present

## 2012-07-03 DIAGNOSIS — M999 Biomechanical lesion, unspecified: Secondary | ICD-10-CM | POA: Diagnosis not present

## 2012-07-09 ENCOUNTER — Ambulatory Visit (INDEPENDENT_AMBULATORY_CARE_PROVIDER_SITE_OTHER): Payer: Medicare Other | Admitting: Family Medicine

## 2012-07-09 ENCOUNTER — Encounter: Payer: Self-pay | Admitting: Family Medicine

## 2012-07-09 ENCOUNTER — Other Ambulatory Visit: Payer: Self-pay | Admitting: Family Medicine

## 2012-07-09 VITALS — BP 108/64 | HR 86 | Temp 98.0°F | Wt 154.2 lb

## 2012-07-09 DIAGNOSIS — J329 Chronic sinusitis, unspecified: Secondary | ICD-10-CM | POA: Diagnosis not present

## 2012-07-09 DIAGNOSIS — F329 Major depressive disorder, single episode, unspecified: Secondary | ICD-10-CM | POA: Diagnosis not present

## 2012-07-09 DIAGNOSIS — R5381 Other malaise: Secondary | ICD-10-CM

## 2012-07-09 DIAGNOSIS — F32A Depression, unspecified: Secondary | ICD-10-CM

## 2012-07-09 DIAGNOSIS — N289 Disorder of kidney and ureter, unspecified: Secondary | ICD-10-CM

## 2012-07-09 DIAGNOSIS — IMO0001 Reserved for inherently not codable concepts without codable children: Secondary | ICD-10-CM

## 2012-07-09 DIAGNOSIS — D649 Anemia, unspecified: Secondary | ICD-10-CM

## 2012-07-09 DIAGNOSIS — R5383 Other fatigue: Secondary | ICD-10-CM

## 2012-07-09 DIAGNOSIS — R51 Headache: Secondary | ICD-10-CM

## 2012-07-09 DIAGNOSIS — F331 Major depressive disorder, recurrent, moderate: Secondary | ICD-10-CM | POA: Insufficient documentation

## 2012-07-09 MED ORDER — ALPRAZOLAM 0.25 MG PO TABS: 0.2500 mg | ORAL_TABLET | Freq: Two times a day (BID) | ORAL | Status: DC | PRN

## 2012-07-09 MED ORDER — DULOXETINE HCL 20 MG PO CPEP: 20.0000 mg | ORAL_CAPSULE | Freq: Every day | ORAL | Status: DC

## 2012-07-09 NOTE — Patient Instructions (Addendum)
I do think there is some depressed mood going on. Let's try cymbalta at 1 pill daily for next several months - if tolerated well we will continue this. This should help both depression and fibromyalgia. Let's try and back off xanax some - instead of twice daily, try to use twice daily as needed. Good to see you today, call us with questions. Return in 1 month to see how you're doing on new medicine cymbalta.  Come in prior for blood work.  Reflux precautions: Head of bed elevated. Avoidance of citrus, fatty foods, chocolate, peppermint, and excessive alcohol, along with sodas, orange juice (acidic drinks) At least a few hours between dinner and bed, minimize naps after eating.

## 2012-07-09 NOTE — Assessment & Plan Note (Signed)
Hopeful for cymbalta to help with this.

## 2012-07-09 NOTE — Assessment & Plan Note (Signed)
Check blood work when returns fasting prior to next visit.

## 2012-07-09 NOTE — Assessment & Plan Note (Signed)
Not consistent with migraine. ?MOH vs TTH.   Does improve with tylenol - advised would continue to monitor.

## 2012-07-09 NOTE — Progress Notes (Signed)
Subjective:    Patient ID: Ashley [name], female    DOB: 1946-04-02, 67 y.o.   MRN: [id number]  HPI CC: f/u URI - not better.  Recent treatment with levaquin, zpack x2 (05/2012) for presumed bronchitis/sinusitis.  Evaluation by Dr. Kendrick Fries 06/11/2012 with concern for sinus infection leading to recurrent and persistent sxs, treated with 10d doxycycline along with florastor and codeine cough syrup. Seen here 06/17/2012 with persistent sxs, advised to finish abx course and give more time. Cough has improved.  Continues to use nasonex.  Has stopped cheratussin.  Presents today with multiple complaints - having headaches, forgetting people's names, feels back of throat with green mucous worse in morning which upsets stomach, as well as some chest discomfort with meals.  "I have no joy".  Doesn't enjoy time with grandchildren 2/2 fatigue/exhaustion - going on for last several months. Increased sleep, decreased energy level, concentration down. Endorses anhedonia and depressed mood. Appetite steady.  No guilt.  No SI/HI.  Has been on alprazolam 0.25mg  bid longterm.  Fibromyalgia - intermittent pains on and off.  Headaches - back of head, treats with tylenol.  Sharp and pressure worse across forehead.  No photo/phonophobia, nausea associated with this.  No vision changes.  Wt Readings from Last 3 Encounters:  07/09/12 154 lb 4 oz (69.967 kg)  06/17/12 152 lb 12 oz (69.287 kg)  06/11/12 154 lb (69.854 kg)   BP Readings from Last 3 Encounters:  07/09/12 108/64  06/17/12 94/62  06/11/12 122/62   Past Medical History  Diagnosis Date  . Migraine without aura, without mention of intractable migraine without mention of status migrainosus   . Diverticulosis of colon     no diverticulitis  . Pure hypercholesterolemia   . Irritable bowel syndrome   . Laryngeal nodule     Dr. Thelma Comp  . Fibrocystic breast disease   . Osteoarthritis   . Hemorrhoids   . Anxiety   . GERD (gastroesophageal  reflux disease)   . Systolic CHF     EF 25-35%, LV dilation, significant MR, goal weight 150lbs  . Nonischemic cardiomyopathy   . Pneumonia 2012  . Mitral regurgitation   . Anxiety   . Pleural effusion, bilateral 2012  . Bronchiectasis     mild, treat URIs aggressively  . Hypertension   . Heart murmur   . Angina   . Lung disease 07/14/11  . Sleep apnea   . Shortness of breath     "resting"  . Anemia   . Fibromyalgia   . Depression   . Disorder of vocal cords   . COPD (chronic obstructive pulmonary disease)   . Chronic sinusitis       Review of Systems Per HPI    Objective:   Physical Exam  Nursing note and vitals reviewed. Constitutional: She appears well-developed and well-nourished. No distress.  HENT:  Head: Normocephalic and atraumatic.  Right Ear: Hearing, tympanic membrane, external ear and ear canal normal.  Left Ear: Hearing, tympanic membrane, external ear and ear canal normal.  Nose: No mucosal edema or rhinorrhea. Right sinus exhibits no maxillary sinus tenderness and no frontal sinus tenderness. Left sinus exhibits no maxillary sinus tenderness and no frontal sinus tenderness.  Mouth/Throat: Uvula is midline, oropharynx is clear and moist and mucous membranes are normal. No oropharyngeal exudate, posterior oropharyngeal edema, posterior oropharyngeal erythema or tonsillar abscesses.  No significant post nasal drainage or mucous appreciated  Eyes: Conjunctivae and EOM are normal. Pupils are equal, round, and reactive to  light.  Neck: Normal range of motion. Neck supple.  Cardiovascular: Normal rate, regular rhythm, normal heart sounds and intact distal pulses.   No murmur heard. Pulmonary/Chest: Effort normal and breath sounds normal. No respiratory distress. She has no wheezes. She has no rales.  Musculoskeletal: She exhibits no edema.  Lymphadenopathy:    She has no cervical adenopathy.  Skin: Skin is warm and dry.  Psychiatric: She has a normal mood and  affect.       Assessment & Plan:

## 2012-07-09 NOTE — Assessment & Plan Note (Signed)
Has several suggestive sxs of depression - discussed this. Pt desires trial of pharmacotherapy to treat depression. Several SSRIs had ingredient of red dye which is a patient allergy. Will start cymbalta hopeful to also help fibro.  Discussed side effects of med. RTC 1 mo for f/u. A total of 30 minutes were spent face-to-face with the patient during this encounter and over half of that time was spent on counseling and coordination of care

## 2012-07-09 NOTE — Assessment & Plan Note (Deleted)
Check blood work when returns fasting prior to next visit. 

## 2012-07-09 NOTE — Assessment & Plan Note (Signed)
Discussed I don't see evidence of active infection currently - seems best she has been since end of last year regarding sinus congestion. However patient feels she is still not fully clear from sinusitis perspective. No indication for abx.

## 2012-07-10 ENCOUNTER — Telehealth: Payer: Self-pay

## 2012-07-10 DIAGNOSIS — M5137 Other intervertebral disc degeneration, lumbosacral region: Secondary | ICD-10-CM | POA: Diagnosis not present

## 2012-07-10 DIAGNOSIS — IMO0002 Reserved for concepts with insufficient information to code with codable children: Secondary | ICD-10-CM | POA: Diagnosis not present

## 2012-07-10 DIAGNOSIS — M999 Biomechanical lesion, unspecified: Secondary | ICD-10-CM | POA: Diagnosis not present

## 2012-07-10 NOTE — Telephone Encounter (Signed)
This is not related to cymbalta. Did she have a coughing fit prior?  Could be from burst capillary from coughing. Lungs were clear yesterday. Would suggest delsym cough syrup to suppress cough Would monitor for now, if repeat to let us know.

## 2012-07-10 NOTE — Telephone Encounter (Signed)
Patient notified. She said she had been coughing but not to the point of a "spasm" cough. Advised to try the delsym to suppress cough, continue cymbalta and call back if it happens again. She verbalized understanding.

## 2012-07-10 NOTE — Telephone Encounter (Signed)
Pt was seen 07/09/12; pt started Cymbalta yesterday. Pt said this morning after coughing pt spit up clear phlegm with a clot of dark blood size of tip of small finger. Left lower lobe of lung still hurts.No fever. Pt has never spit up blood before and wants to know if related to taking Cymbalta or what does Dr Reece Agar think reason for clot.Please advise. [facility] [address]Pt is very concerned.)

## 2012-07-16 ENCOUNTER — Ambulatory Visit (INDEPENDENT_AMBULATORY_CARE_PROVIDER_SITE_OTHER): Payer: Medicare Other | Admitting: Pulmonary Disease

## 2012-07-16 ENCOUNTER — Encounter: Payer: Self-pay | Admitting: Pulmonary Disease

## 2012-07-16 ENCOUNTER — Other Ambulatory Visit: Payer: Self-pay | Admitting: Family Medicine

## 2012-07-16 VITALS — BP 120/80 | HR 70 | Temp 97.9°F | Ht 65.5 in | Wt 153.0 lb

## 2012-07-16 DIAGNOSIS — R059 Cough, unspecified: Secondary | ICD-10-CM | POA: Diagnosis not present

## 2012-07-16 DIAGNOSIS — R05 Cough: Secondary | ICD-10-CM

## 2012-07-16 NOTE — Assessment & Plan Note (Signed)
This has been a persistent problem and is due to two things primarily: post nasal drip and acid reflux.  In my opinion we need to clear up the sinus congestion (if that is possible) and then proceed with her continued GI work up and consideration of Nissen fundoplication as recommended by Dr. Marisa Sprinkles.  We have been unable to clear up her sinus drainage and congestion with nasal steroids, saline rinses, and five rounds of antibiotics since November.  Plan: -I asked that she see Dr. Genevive Bi again for her sinus disease -if we are able to clear up her sinuses (a "BIG IF"), then we should proceed with her endoscopy/GI work up

## 2012-07-16 NOTE — Progress Notes (Signed)
Subjective:    Patient ID: Ashley [name], female    DOB: 05/15/1946, 67 y.o.   MRN: [id number]  Synopsis: 67 y/o female with chronic cough, intermittent wheezing, and multiple hospitalizations for pneumonia presented to our clinic initially on 02/20/11 for evaluation of the same. She has seen an allergist for years for allergic rhinitis and had been told that she did not have asthma. We ordered an echocardiogram which showed significanly decreased LV function. She underwent a cardiac myoview which showed no evidence of ischaemia. She had a negative metacholine challenge test in 07/2009.  She was admitted in 04/2011 for volume overload.  A CT scan during that time showed volume overload and a pulmonary nodule.  There were [facility] CT chest reads suggestive of bronchiectasis but I (Nadia Viar) have never been convinced neither have [facility] and [employer].  She was seen by Dr. Thana Ates in 09/2011 for recurrent cough.  This was felt to be due to GERD and a Nissen Fundoplication has been considered.     HPI  04/03/11 ROV --Ms. Dinger has had a busy 6 weeks since our last visit as detailed above. She has multiple complaints today, but her primary respiratory complaints are wheezing, shortness of breath, and continuous cough productive of clear sputum. She says that the symptoms are worse at night when she lies flat, so she has to prop her head up. She is a little more swollen than usual. She says that the tramadol makes her stomach hurt. She doesn't think that the pepcid helps. Her allergist put her on Asthmanex but she doesn't think that it has helped. She was put on a prednisone taper last week and she says that this has not helped her wheeze or shortness of breath at all. She wheezes nearly continuously. No fevers or chills.  She notes that she frequently wakes up many times during the night choking and gasping for air. Her husband says that she stops breathing at times.  She also notes waking up  choking frequently in the middle of the night.  Her sinuses have been well controlled recently.   04/26/11 ROV -- Ms. Demarcus returns for further evaluation of shortness of breath and cough. She states that she has had chills at home in the last week, associated with sorethroat, cough, wheezing (especially at night), clear sputum production, and chest pain with cough. She says that she has woken up multiple times at night with wheezing which is relieved with albuterol.  She stopped taking the bystolic and feels better now (less short of breath). She has also been taking more furosemide per Dr. Windell Hummingbird instructions.   05/24/11 ROV/Hosp f/u-- She reports that she is doing fairly well after her recent hospitalization for volume overload and cough. Her cough has improved quite a bit since then and she is not as short of breath since starting on oxygen. She only has scant clear sputum production. She notes that she does not urinate much on her current dose of lasix. She is taking the lasix and metolazone as written. She stopped taking the alvesco and nebulized meds.  07/31/2011 ROV-- She is doing fairly well. She has some green sputum and sinus congestion on occasion, worse in the last three weeks.  No fevers or chills or change in baseline dyspnea.  For the most part she feels her breathing has improved. She has had some eratic blood pressures at home last week.   She is adjusting her lasix for a goal weight of 150  lbs.   10/26/2011 ROV -- Ms. Mcneff is doing pretty well.  Her energy has been up and down but overall she is better.  She is going to pulm rehab Mon and Friday.  She apparently recently stopped two of her blood pressure meds. She does not complain of dyspnea but she notes some fatigue and leg cramps with a lot of stiffness.  She still has occasional coughing paroxysms. She thinks that her cough is better while using the continuous O2.  She notes nose bleeding from the oxygen use.    03/2012 ROV --  Ms. Kapusta returns to clinic today. She's had a rough go of it the last several months with an episode of diverticulitis. Fortunately cough has not been a significant problem in the last several months. She states that it is at baseline and continues to bother her on a daily basis but it has been the least of her worries. She has had some issues with both acute renal failure and volume overload which are now improving after Dr. Mariah Milling and Sharen Hones adjusted her diuretics. She is not short of breath and her sinus disease is fairly well controlled.  06/11/2012 ROV -- Since November Miryam has had near continuous symptoms of sinus congestion and cough. She traveled to [place] in November and had sore throat, an exacerbation of her CHF and apparently some renal failure. She saw Dr. Mariah Milling and he adjusted her meds and the volume overload and renal failure improved. On January 4 she wants to just do for your use and again developed sinus congestion and cough. She was treated with Levaquin and Z-Pak at this point. She returned and saw Dr. Sharen Hones in mid January and was treated again with a Z-Pak. She tells me today that she still has ongoing sinus congestion, green sputum, green mucus production, and a very sore throat. She thinks that this is coming from her "voice box". She does not feel that she has chest congestion. She does not have fevers. Her shortness of breath is perhaps slightly increased. She has been sneezing a lot lately.  07/16/2012 ROV -- Since our last visit Ms. Huseman continues to have continuous green sinus drainage.  This causes her to feel the need to clear her throat all day.  She feels pressure, eye, and tooth pain on the left side of her face.  She has not been back to see ENT.  She took the doxycycline we recommended and it did not help.  She continues to take flonase, saline rinses, and mucinex.    Past Medical History  Diagnosis Date  . Migraine without aura, without mention of  intractable migraine without mention of status migrainosus   . History of diverticulitis of colon   . Pure hypercholesterolemia   . Irritable bowel syndrome   . Laryngeal nodule     Dr. Thelma Comp  . Fibrocystic breast disease   . Osteoarthritis   . Hemorrhoids   . Anxiety   . GERD (gastroesophageal reflux disease)   . Systolic CHF     EF 25-35%, LV dilation, significant MR, goal weight 150lbs  . Nonischemic cardiomyopathy   . History of pneumonia 2012  . Mitral regurgitation   . Anxiety   . Bronchiectasis     possibly mild, treat URIs aggressively  . Hypertension   . Heart murmur   . Angina   . Lung disease 07/14/11  . Sleep apnea   . Shortness of breath     "resting"  . Anemia   .  Fibromyalgia   . Depression   . Disorder of vocal cords   . Chronic sinusitis       Review of Systems  Constitutional: Negative for activity change, appetite change and fatigue.  HENT: Positive for congestion, postnasal drip and sinus pressure. Negative for nosebleeds.   Respiratory: Negative for choking and chest tightness.   Cardiovascular: Negative for palpitations and leg swelling.       Objective:   Physical Exam  Filed Vitals:   07/16/12 1518  BP: 120/80  Pulse: 70  Temp: 97.9 F (36.6 C)  TempSrc: Oral  Height: 5' 5.5" (1.664 m)  Weight: 153 lb (69.4 kg)  SpO2: 100%  RA  Gen:  no acute distress HEENT: NCAT, PERRL, EOMi, OP clear, neck supple without masses PULM: CTA B CV: RRR, no mgr, no JVD Ext: warm, no edema, no clubbing, no cyanosis  12/14 /12 CT Thorax: mediastinal lymphadenopathy, question right hilar mass, mild bronchiectasis in bases, pleural effusion with increased interlobular thickening  06/12/11 CT thorax: IMPRESSION:  Interval clearing of congestive heart failure and associated  reactive mediastinal and bihilar adenopathy. No evidence of  interstitial lung disease.     Assessment & Plan:   Cough This has been a persistent problem and is due to two  things primarily: post nasal drip and acid reflux.  In my opinion we need to clear up the sinus congestion (if that is possible) and then proceed with her continued GI work up and consideration of Nissen fundoplication as recommended by Dr. Marisa Sprinkles.  We have been unable to clear up her sinus drainage and congestion with nasal steroids, saline rinses, and five rounds of antibiotics since November.  Plan: -I asked that she see Dr. Genevive Bi again for her sinus disease -if we are able to clear up her sinuses (a "BIG IF"), then we should proceed with her endoscopy/GI work up    Updated Medication List Outpatient Encounter Prescriptions as of 07/16/2012  Medication Sig Dispense Refill  . ALPRAZolam (XANAX) 0.25 MG tablet Take 0.25 mg by mouth 2 (two) times daily as needed for anxiety. Pt states trying to taper off med, taking 1.5 tablets per day currently      . aspirin 81 MG EC tablet Take 81 mg by mouth at bedtime.       Marland Kitchen CALCIUM-VITAMIN D PO Take 1 capsule by mouth daily.      . carvedilol (COREG) 12.5 MG tablet Take 1.5 tablets (18.75 mg total) by mouth daily. Take 1 and a half tablets daily  45 tablet  6  . Coenzyme Q10 (COQ-10) 150 MG CAPS Take 1 tablet by mouth daily.      Marland Kitchen dextromethorphan (DELSYM) 30 MG/5ML liquid Take 60 mg by mouth every 12 (twelve) hours as needed. For cough/congestion.      . DULoxetine (CYMBALTA) 20 MG capsule Take 1 capsule (20 mg total) by mouth daily.  30 capsule  6  . hydrALAZINE (APRESOLINE) 25 MG tablet Takes 0.5 tablet daily.  45 tablet  3  . metolazone (ZAROXOLYN) 2.5 MG tablet Take 1 tablet (2.5 mg total) by mouth every other day. Take one tablet every other day before torsemide  45 tablet  3  . mometasone (NASONEX) 50 MCG/ACT nasal spray Place 1 spray into the nose 2 (two) times daily.       Marland Kitchen omeprazole (PRILOSEC) 40 MG capsule Take 40 mg by mouth 2 (two) times daily.      . potassium chloride (K-DUR) 10 MEQ tablet  Take 1-4 tablets (10-40 mEq total) by mouth  daily as needed. for every 10mg  toresmide  120 tablet  11  . pravastatin (PRAVACHOL) 80 MG tablet Take 80 mg by mouth every 3 (three) days.      Marland Kitchen spironolactone (ALDACTONE) 25 MG tablet Take 1 tablet by mouth daily.      Marland Kitchen torsemide (DEMADEX) 10 MG tablet 1-4 tablets daily depending on weight (goal 150-153lbs)      . vitamin B-12 (CYANOCOBALAMIN) 500 MCG tablet Take 1 tablet (500 mcg total) by mouth daily.      . vitamin C (ASCORBIC ACID) 500 MG tablet Take 500 mg by mouth 2 (two) times daily.       Marland Kitchen zolpidem (AMBIEN) 5 MG tablet Take 2.5-5 mg by mouth at bedtime as needed. For insomnia       . [DISCONTINUED] ALPRAZolam (XANAX) 0.25 MG tablet Take 1 tablet (0.25 mg total) by mouth 2 (two) times daily as needed for anxiety.  60 tablet  0   No facility-administered encounter medications on file as of 07/16/2012.

## 2012-07-16 NOTE — Patient Instructions (Signed)
Keep taking your medications as written Go see Dr. Genevive Bi again to discuss your sinus symptoms After that you should discuss the endoscopy with Dr. Dickie La We will see you back in 3-4 months or sooner if needed

## 2012-07-16 NOTE — Telephone Encounter (Signed)
[facility] called requesting RX for ambien 5mg  tablet.  They do not have this medication on file for this pt.

## 2012-07-17 DIAGNOSIS — M999 Biomechanical lesion, unspecified: Secondary | ICD-10-CM | POA: Diagnosis not present

## 2012-07-17 DIAGNOSIS — M5137 Other intervertebral disc degeneration, lumbosacral region: Secondary | ICD-10-CM | POA: Diagnosis not present

## 2012-07-17 DIAGNOSIS — IMO0002 Reserved for concepts with insufficient information to code with codable children: Secondary | ICD-10-CM | POA: Diagnosis not present

## 2012-07-17 MED ORDER — ZOLPIDEM TARTRATE 5 MG PO TABS: 2.5000 mg | ORAL_TABLET | Freq: Every evening | ORAL | Status: DC | PRN

## 2012-07-17 NOTE — Telephone Encounter (Signed)
plz phone in. 

## 2012-07-17 NOTE — Telephone Encounter (Signed)
Rx called in as directed.   

## 2012-07-18 DIAGNOSIS — J320 Chronic maxillary sinusitis: Secondary | ICD-10-CM | POA: Diagnosis not present

## 2012-07-18 DIAGNOSIS — R51 Headache: Secondary | ICD-10-CM | POA: Diagnosis not present

## 2012-07-24 DIAGNOSIS — M999 Biomechanical lesion, unspecified: Secondary | ICD-10-CM | POA: Diagnosis not present

## 2012-07-24 DIAGNOSIS — IMO0002 Reserved for concepts with insufficient information to code with codable children: Secondary | ICD-10-CM | POA: Diagnosis not present

## 2012-07-24 DIAGNOSIS — M5137 Other intervertebral disc degeneration, lumbosacral region: Secondary | ICD-10-CM | POA: Diagnosis not present

## 2012-07-30 ENCOUNTER — Other Ambulatory Visit: Payer: Medicare Other

## 2012-07-31 ENCOUNTER — Other Ambulatory Visit (INDEPENDENT_AMBULATORY_CARE_PROVIDER_SITE_OTHER): Payer: Medicare Other

## 2012-07-31 DIAGNOSIS — J320 Chronic maxillary sinusitis: Secondary | ICD-10-CM | POA: Diagnosis not present

## 2012-07-31 DIAGNOSIS — M999 Biomechanical lesion, unspecified: Secondary | ICD-10-CM | POA: Diagnosis not present

## 2012-07-31 DIAGNOSIS — R51 Headache: Secondary | ICD-10-CM | POA: Diagnosis not present

## 2012-07-31 DIAGNOSIS — D649 Anemia, unspecified: Secondary | ICD-10-CM

## 2012-07-31 DIAGNOSIS — N289 Disorder of kidney and ureter, unspecified: Secondary | ICD-10-CM

## 2012-07-31 DIAGNOSIS — IMO0002 Reserved for concepts with insufficient information to code with codable children: Secondary | ICD-10-CM | POA: Diagnosis not present

## 2012-07-31 DIAGNOSIS — R5383 Other fatigue: Secondary | ICD-10-CM

## 2012-07-31 DIAGNOSIS — M5137 Other intervertebral disc degeneration, lumbosacral region: Secondary | ICD-10-CM | POA: Diagnosis not present

## 2012-07-31 DIAGNOSIS — R5381 Other malaise: Secondary | ICD-10-CM | POA: Diagnosis not present

## 2012-07-31 LAB — BASIC METABOLIC PANEL
BUN: 33 mg/dL — ABNORMAL HIGH (ref 6–23)
CO2: 25 mEq/L (ref 19–32)
Calcium: 10.2 mg/dL (ref 8.4–10.5)
Chloride: 101 mEq/L (ref 96–112)
Creatinine, Ser: 1.2 mg/dL (ref 0.4–1.2)
GFR: 46.28 mL/min — ABNORMAL LOW (ref >60.00)
Glucose, Bld: 100 mg/dL — ABNORMAL HIGH (ref 70–99)
Potassium: 4.3 mEq/L (ref 3.5–5.1)
Sodium: 138 mEq/L (ref 135–145)

## 2012-07-31 LAB — CBC WITH DIFFERENTIAL/PLATELET
Basophils Absolute: 0 10*3/uL (ref 0.0–0.1)
Basophils Relative: 0.3 % (ref 0.0–3.0)
Eosinophils Absolute: 0.1 10*3/uL (ref 0.0–0.7)
Eosinophils Relative: 1.4 % (ref 0.0–5.0)
HCT: 36.6 % (ref 36.0–46.0)
Hemoglobin: 12.2 g/dL (ref 12.0–15.0)
Lymphocytes Relative: 23 % (ref 12.0–46.0)
Lymphs Abs: 2 10*3/uL (ref 0.7–4.0)
MCHC: 33.4 g/dL (ref 30.0–36.0)
MCV: 88.2 fl (ref 78.0–100.0)
Monocytes Absolute: 0.7 10*3/uL (ref 0.1–1.0)
Monocytes Relative: 7.8 % (ref 3.0–12.0)
Neutro Abs: 5.9 10*3/uL (ref 1.4–7.7)
Neutrophils Relative %: 67.5 % (ref 43.0–77.0)
Platelets: 241 10*3/uL (ref 150.0–400.0)
RBC: 4.16 Mil/uL (ref 3.87–5.11)
RDW: 12.7 % (ref 11.5–14.6)
WBC: 8.8 10*3/uL (ref 4.5–10.5)

## 2012-07-31 LAB — TSH: TSH: 0.58 u[IU]/mL (ref 0.35–5.50)

## 2012-08-01 ENCOUNTER — Encounter: Payer: Self-pay | Admitting: *Deleted

## 2012-08-02 DIAGNOSIS — J328 Other chronic sinusitis: Secondary | ICD-10-CM | POA: Diagnosis not present

## 2012-08-02 DIAGNOSIS — R51 Headache: Secondary | ICD-10-CM | POA: Diagnosis not present

## 2012-08-06 ENCOUNTER — Ambulatory Visit (INDEPENDENT_AMBULATORY_CARE_PROVIDER_SITE_OTHER): Payer: Medicare Other | Admitting: Family Medicine

## 2012-08-06 ENCOUNTER — Encounter: Payer: Self-pay | Admitting: Family Medicine

## 2012-08-06 VITALS — BP 110/52 | HR 82 | Temp 97.8°F | Ht 64.75 in | Wt 151.0 lb

## 2012-08-06 DIAGNOSIS — I499 Cardiac arrhythmia, unspecified: Secondary | ICD-10-CM | POA: Insufficient documentation

## 2012-08-06 DIAGNOSIS — N289 Disorder of kidney and ureter, unspecified: Secondary | ICD-10-CM

## 2012-08-06 DIAGNOSIS — F32A Depression, unspecified: Secondary | ICD-10-CM

## 2012-08-06 DIAGNOSIS — J329 Chronic sinusitis, unspecified: Secondary | ICD-10-CM

## 2012-08-06 DIAGNOSIS — F329 Major depressive disorder, single episode, unspecified: Secondary | ICD-10-CM

## 2012-08-06 MED ORDER — CARVEDILOL 12.5 MG PO TABS: 12.5000 mg | ORAL_TABLET | Freq: Two times a day (BID) | ORAL | Status: DC

## 2012-08-06 NOTE — Assessment & Plan Note (Signed)
With tachycardia noted today to 110s. Obtained EKG today - NSR rate 90s, normal axis, intervals, no acute ST/T changes, good R wave progression, frequent PVCs (x4), none continuous. Given endorsed episodes of tachycardia at home, will increase coreg to 12.5mg  bid, space out dosing (am and at 4pm with mid afternoon snack). rec f/u with cards.

## 2012-08-06 NOTE — Assessment & Plan Note (Signed)
Recent eval by ENT revealing marked sinus disease. To see cards to discuss clearance for possible upcoming sinus surgery.

## 2012-08-06 NOTE — Assessment & Plan Note (Signed)
Stable. Chronic insufficiency since 05/2011. Reviewed labs and knows to avoid NSAIDs and stay well hydrated.

## 2012-08-06 NOTE — Progress Notes (Signed)
  Subjective:    Patient ID: Ashley [name], female    DOB: [birth year], 67 y.o.   MRN: [id number]  HPI CC: 1 mo f/u  Ashley [name] was seen here 1 month ago and we started cymbalta 20mg  daily at that time with concern for depression.  Has noticed increased body jerkings since starting cymbalta but this is actually better in last few days.  Cut down on alprazolam to 1/2 tab in am and 1/2 at noon.  Feels maybe a little better.  In interim, seen by Dr. Kendrick Fries for chronic cough thought due to post nasal drip and acid reflux, referred back to ENT to assist with control of persistent sinus disease/drainage.  Seen by Dr. Johnnette Litter - found to have some absent sinuses, small sinus openings and blocked maxillary sinuses bilaterally and recommended balloon sinoplasty and maxillary antrostomy.  Has appt with Dr. Mariah Milling April 3rd for cardiac clearance. Very hopeful that this procedure will improve sxs.  Reviewed recent blood work.  Irregular heart beat today - takes carvedilol 12.5mg  1/2 tab in am and 1 tab at lunch.  Also on hydralazine 25mg  1/2 tab daily.  Denies chest pain, palpitations, dyspnea.  Past Medical History  Diagnosis Date  . Migraine without aura, without mention of intractable migraine without mention of status migrainosus   . History of diverticulitis of colon   . Pure hypercholesterolemia   . Irritable bowel syndrome   . Laryngeal nodule     Dr. Thelma Comp  . Fibrocystic breast disease   . Osteoarthritis   . Hemorrhoids   . Anxiety   . GERD (gastroesophageal reflux disease)   . Systolic CHF     EF 25-35%, LV dilation, significant MR, goal weight 150lbs  . Nonischemic cardiomyopathy   . History of pneumonia 2012  . Mitral regurgitation   . Anxiety   . Bronchiectasis     possibly mild, treat URIs aggressively  . Hypertension   . Heart murmur   . Angina   . Lung disease 07/14/11  . Sleep apnea   . Shortness of breath     "resting"  . Anemia   . Fibromyalgia   . Depression   .  Disorder of vocal cords   . Chronic sinusitis      Review of Systems Per HPI     Objective:   Physical Exam  Nursing note and vitals reviewed. Constitutional: She appears well-developed and well-nourished. No distress.  HENT:  Mouth/Throat: Oropharynx is clear and moist. No oropharyngeal exudate.  Eyes: Conjunctivae and EOM are normal. Pupils are equal, round, and reactive to light. No scleral icterus.  Neck: Normal range of motion. Neck supple.  Cardiovascular: Normal heart sounds and intact distal pulses.  An irregular rhythm present. Tachycardia present.   No murmur heard. Pulmonary/Chest: Effort normal and breath sounds normal. No respiratory distress. She has no wheezes. She has no rales.  Skin: Skin is warm and dry.  Psychiatric: She has a normal mood and affect.       Assessment & Plan:

## 2012-08-06 NOTE — Assessment & Plan Note (Signed)
Started cymbalta 20mg  last month. Noted what sound like myoclonic jerks with initiation of med, but this has largely abated Overall possibly better mood. Recommended continue current dose, reassess next visit.

## 2012-08-06 NOTE — Patient Instructions (Signed)
Keep appointment with Dr. Mariah Milling. Continue cymbalta at 20mg  daily. Let's change carvedilol to 1 pill of 12.5mg  twice daily - sent in new # to pharmacy. Return to see me in 2-3 months for follow up.

## 2012-08-07 DIAGNOSIS — M5137 Other intervertebral disc degeneration, lumbosacral region: Secondary | ICD-10-CM | POA: Diagnosis not present

## 2012-08-07 DIAGNOSIS — M999 Biomechanical lesion, unspecified: Secondary | ICD-10-CM | POA: Diagnosis not present

## 2012-08-07 DIAGNOSIS — IMO0002 Reserved for concepts with insufficient information to code with codable children: Secondary | ICD-10-CM | POA: Diagnosis not present

## 2012-08-09 DIAGNOSIS — L919 Hypertrophic disorder of the skin, unspecified: Secondary | ICD-10-CM | POA: Diagnosis not present

## 2012-08-09 DIAGNOSIS — D485 Neoplasm of uncertain behavior of skin: Secondary | ICD-10-CM | POA: Diagnosis not present

## 2012-08-09 DIAGNOSIS — R21 Rash and other nonspecific skin eruption: Secondary | ICD-10-CM | POA: Diagnosis not present

## 2012-08-09 DIAGNOSIS — L909 Atrophic disorder of skin, unspecified: Secondary | ICD-10-CM | POA: Diagnosis not present

## 2012-08-09 DIAGNOSIS — D0460 Carcinoma in situ of skin of unspecified upper limb, including shoulder: Secondary | ICD-10-CM | POA: Diagnosis not present

## 2012-08-15 ENCOUNTER — Encounter: Payer: Self-pay | Admitting: Cardiovascular Disease

## 2012-08-15 ENCOUNTER — Ambulatory Visit (INDEPENDENT_AMBULATORY_CARE_PROVIDER_SITE_OTHER): Payer: Medicare Other | Admitting: Cardiovascular Disease

## 2012-08-15 VITALS — BP 100/60 | HR 78 | Ht 65.5 in | Wt 153.0 lb

## 2012-08-15 DIAGNOSIS — I499 Cardiac arrhythmia, unspecified: Secondary | ICD-10-CM

## 2012-08-15 DIAGNOSIS — F329 Major depressive disorder, single episode, unspecified: Secondary | ICD-10-CM

## 2012-08-15 DIAGNOSIS — R000 Tachycardia, unspecified: Secondary | ICD-10-CM | POA: Diagnosis not present

## 2012-08-15 DIAGNOSIS — I5022 Chronic systolic (congestive) heart failure: Secondary | ICD-10-CM | POA: Diagnosis not present

## 2012-08-15 DIAGNOSIS — J329 Chronic sinusitis, unspecified: Secondary | ICD-10-CM

## 2012-08-15 DIAGNOSIS — F32A Depression, unspecified: Secondary | ICD-10-CM

## 2012-08-15 DIAGNOSIS — I509 Heart failure, unspecified: Secondary | ICD-10-CM

## 2012-08-15 DIAGNOSIS — R079 Chest pain, unspecified: Secondary | ICD-10-CM

## 2012-08-15 NOTE — Assessment & Plan Note (Signed)
She reports fatigue. Uncertain if there is improvement on Cymbalta. She will followup with Dr. Sharen Hones.

## 2012-08-15 NOTE — Patient Instructions (Addendum)
You are doing well. No medication changes were made.  Please call us if you have new issues that need to be addressed before your next appt.  Your physician wants you to follow-up in: 6 months.  You will receive a reminder letter in the mail two months in advance. If you don't receive a letter, please call our office to schedule the follow-up appointment.   

## 2012-08-15 NOTE — Assessment & Plan Note (Signed)
Weight is stable on her current medication regimen. No changes made.

## 2012-08-15 NOTE — Assessment & Plan Note (Signed)
She would be acceptable risk for upcoming sinus surgery with Dr. Toya Smothers. No further testing needed. We have recommended minimizing IV fluids in the perioperative and postoperative period given history of heart failure and low ejection fraction.

## 2012-08-15 NOTE — Progress Notes (Signed)
Patient ID: Ashley [name], female    DOB: January 23, 1946, 67 y.o.   MRN: [id number]  HPI Comments: 67 y/o female with history of pneumonia, chronic cough felt secondary to GERD, abnormal PFTs in the past, shortness of breath dating back to September 2012 that presented acutely, CT scan showing progressive pleural effusion , echo showing Cardiomyopathy  ejection fraction estimated at 25-35%,  Previous  Lexiscan Myoview  showed no significant ischemia, no EKG changes concerning for ischemia.   Follow up admission to [facility] in December 2012 for bronchitis and hypoxia as well as systolic CHF. She had antibiotics, prednisone and diuresis and has felt better since discharge. ACE inhibitor placed on hold secondary to chronic cough.  S/P ICD 10 days ago for EF <35%.  Goal weight has been in the low 150 range. She has been managing her weight well with torsemide and metolazone. Previous Cardiac MRI showed ejection fraction 34% ICD placed  by Dr. Ladona Ridgel.  She has been recently traveling on a cruise around [place]. More recently has been feeling very tired. Started on Cymbalta. She reports that this has caused "body jerks" when she is trying to sleep. She has also noticed periods of tachycardia. Recent stressors include the need for sinus surgery also skin cancer on her left forearm. Weight has been stable, no significant edema or shortness of breath. Traveling with no problems and has several trips planned in the near future. Stable weight in the low 150 range  Problems in the past on Lipitor with cramping. Started on pravastatin   EKG shows normal sinus rhythm with rate 78 beats per minute with nonspecific ST abnormality      Outpatient Encounter Prescriptions as of 08/15/2012  Medication Sig Dispense Refill  . ALPRAZolam (XANAX) 0.25 MG tablet Take 0.25 mg by mouth daily. Pt states trying to taper off med, taking 1.5 tablets per day currently      . aspirin 81 MG EC tablet Take 81 mg by  mouth at bedtime.       Marland Kitchen CALCIUM-VITAMIN D PO Take 1 capsule by mouth daily.      . carvedilol (COREG) 12.5 MG tablet Takes 2 tablets twice daily.      . Coenzyme Q10 (COQ-10) 150 MG CAPS Take 1 tablet by mouth daily.      Marland Kitchen dextromethorphan (DELSYM) 30 MG/5ML liquid Take 60 mg by mouth every 12 (twelve) hours as needed. For cough/congestion.      . DULoxetine (CYMBALTA) 20 MG capsule Take 1 capsule (20 mg total) by mouth daily.  30 capsule  6  . hydrALAZINE (APRESOLINE) 25 MG tablet Takes 0.5 tablet daily.  45 tablet  3  . metolazone (ZAROXOLYN) 2.5 MG tablet Take 1 tablet (2.5 mg total) by mouth every other day. Take one tablet every other day before torsemide  45 tablet  3  . mometasone (NASONEX) 50 MCG/ACT nasal spray Place 1 spray into the nose 2 (two) times daily.       Marland Kitchen omeprazole (PRILOSEC) 40 MG capsule Take 40 mg by mouth 2 (two) times daily.      . potassium chloride (K-DUR) 10 MEQ tablet Take 1-4 tablets (10-40 mEq total) by mouth daily as needed. for every 10mg  toresmide  120 tablet  11  . pravastatin (PRAVACHOL) 80 MG tablet Take 80 mg by mouth every 3 (three) days.      Marland Kitchen spironolactone (ALDACTONE) 25 MG tablet Take 1 tablet by mouth daily.      Marland Kitchen  torsemide (DEMADEX) 10 MG tablet 1-4 tablets daily depending on weight (goal 150-153lbs)      . vitamin C (ASCORBIC ACID) 500 MG tablet Take 500 mg by mouth 2 (two) times daily.       Marland Kitchen zolpidem (AMBIEN) 5 MG tablet Take 0.5-1 tablets (2.5-5 mg total) by mouth at bedtime as needed. For insomnia  30 tablet  0  . [DISCONTINUED] carvedilol (COREG) 12.5 MG tablet Take 1 tablet (12.5 mg total) by mouth 2 (two) times daily with a meal. Take 1 and a half tablets daily  60 tablet  6   No facility-administered encounter medications on file as of 08/15/2012.    Review of Systems  Constitutional: Negative.   HENT: Positive for sinus pressure.   Eyes: Negative.   Respiratory: Negative.   Cardiovascular: Positive for palpitations.   Gastrointestinal: Negative.   Musculoskeletal: Negative.   Skin: Negative.   Neurological: Negative.   Psychiatric/Behavioral: Negative.   All other systems reviewed and are negative.     BP 100/60  Pulse 78  Ht 5' 5.5" (1.664 m)  Wt 153 lb (69.4 kg)  BMI 25.06 kg/m2  Physical Exam  Nursing note and vitals reviewed. Constitutional: She is oriented to person, place, and time. She appears well-developed and well-nourished.  HENT:  Head: Normocephalic.  Nose: Nose normal.  Mouth/Throat: Oropharynx is clear and moist.  Eyes: Conjunctivae are normal. Pupils are equal, round, and reactive to light.  Neck: Normal range of motion. Neck supple. No JVD present.  Cardiovascular: Normal rate, regular rhythm, S1 normal, S2 normal, normal heart sounds and intact distal pulses.  Exam reveals no gallop and no friction rub.   No murmur heard. Pulmonary/Chest: Effort normal and breath sounds normal. No respiratory distress. She has no wheezes. She has no rales. She exhibits no tenderness.  Abdominal: Soft. Bowel sounds are normal. She exhibits no distension. There is no tenderness.  Musculoskeletal: Normal range of motion. She exhibits no edema and no tenderness.  Lymphadenopathy:    She has no cervical adenopathy.  Neurological: She is alert and oriented to person, place, and time. Coordination normal.  Skin: Skin is warm and dry. No rash noted. No erythema.  Psychiatric: She has a normal mood and affect. Her behavior is normal. Judgment and thought content normal.    Assessment and Plan

## 2012-08-15 NOTE — Assessment & Plan Note (Signed)
PVCs seen on prior EKGs. She is somewhat symptomatic. If symptoms get worse, we could increase her beta blocker as tolerated. Blood pressure is low today. Minimal ectopy on today's visit.

## 2012-08-23 DIAGNOSIS — L821 Other seborrheic keratosis: Secondary | ICD-10-CM | POA: Diagnosis not present

## 2012-08-23 DIAGNOSIS — C44621 Squamous cell carcinoma of skin of unspecified upper limb, including shoulder: Secondary | ICD-10-CM | POA: Diagnosis not present

## 2012-08-23 DIAGNOSIS — R51 Headache: Secondary | ICD-10-CM | POA: Diagnosis not present

## 2012-08-23 DIAGNOSIS — J328 Other chronic sinusitis: Secondary | ICD-10-CM | POA: Diagnosis not present

## 2012-08-23 DIAGNOSIS — D0460 Carcinoma in situ of skin of unspecified upper limb, including shoulder: Secondary | ICD-10-CM | POA: Diagnosis not present

## 2012-08-27 ENCOUNTER — Ambulatory Visit: Payer: Self-pay | Admitting: Otolaryngology

## 2012-09-09 ENCOUNTER — Ambulatory Visit: Payer: Self-pay | Admitting: Otolaryngology

## 2012-09-09 DIAGNOSIS — Z882 Allergy status to sulfonamides status: Secondary | ICD-10-CM | POA: Diagnosis not present

## 2012-09-09 DIAGNOSIS — Z9109 Other allergy status, other than to drugs and biological substances: Secondary | ICD-10-CM | POA: Diagnosis not present

## 2012-09-09 DIAGNOSIS — K219 Gastro-esophageal reflux disease without esophagitis: Secondary | ICD-10-CM | POA: Diagnosis not present

## 2012-09-09 DIAGNOSIS — Z888 Allergy status to other drugs, medicaments and biological substances status: Secondary | ICD-10-CM | POA: Diagnosis not present

## 2012-09-09 DIAGNOSIS — Z91040 Latex allergy status: Secondary | ICD-10-CM | POA: Diagnosis not present

## 2012-09-09 DIAGNOSIS — M1990 Unspecified osteoarthritis, unspecified site: Secondary | ICD-10-CM | POA: Diagnosis not present

## 2012-09-09 DIAGNOSIS — J323 Chronic sphenoidal sinusitis: Secondary | ICD-10-CM | POA: Diagnosis not present

## 2012-09-09 DIAGNOSIS — I2510 Atherosclerotic heart disease of native coronary artery without angina pectoris: Secondary | ICD-10-CM | POA: Diagnosis not present

## 2012-09-09 DIAGNOSIS — K589 Irritable bowel syndrome without diarrhea: Secondary | ICD-10-CM | POA: Diagnosis not present

## 2012-09-09 DIAGNOSIS — F329 Major depressive disorder, single episode, unspecified: Secondary | ICD-10-CM | POA: Diagnosis not present

## 2012-09-09 DIAGNOSIS — F411 Generalized anxiety disorder: Secondary | ICD-10-CM | POA: Diagnosis not present

## 2012-09-09 DIAGNOSIS — J328 Other chronic sinusitis: Secondary | ICD-10-CM | POA: Diagnosis not present

## 2012-09-09 DIAGNOSIS — Z91041 Radiographic dye allergy status: Secondary | ICD-10-CM | POA: Diagnosis not present

## 2012-09-09 DIAGNOSIS — J320 Chronic maxillary sinusitis: Secondary | ICD-10-CM | POA: Diagnosis not present

## 2012-09-09 DIAGNOSIS — Z7982 Long term (current) use of aspirin: Secondary | ICD-10-CM | POA: Diagnosis not present

## 2012-09-09 DIAGNOSIS — G2581 Restless legs syndrome: Secondary | ICD-10-CM | POA: Diagnosis not present

## 2012-09-09 DIAGNOSIS — I10 Essential (primary) hypertension: Secondary | ICD-10-CM | POA: Diagnosis not present

## 2012-09-09 DIAGNOSIS — Z95810 Presence of automatic (implantable) cardiac defibrillator: Secondary | ICD-10-CM | POA: Diagnosis not present

## 2012-09-09 DIAGNOSIS — J321 Chronic frontal sinusitis: Secondary | ICD-10-CM | POA: Diagnosis not present

## 2012-09-09 DIAGNOSIS — G43909 Migraine, unspecified, not intractable, without status migrainosus: Secondary | ICD-10-CM | POA: Diagnosis not present

## 2012-09-09 DIAGNOSIS — Z79899 Other long term (current) drug therapy: Secondary | ICD-10-CM | POA: Diagnosis not present

## 2012-09-09 DIAGNOSIS — K5730 Diverticulosis of large intestine without perforation or abscess without bleeding: Secondary | ICD-10-CM | POA: Diagnosis not present

## 2012-09-09 DIAGNOSIS — IMO0001 Reserved for inherently not codable concepts without codable children: Secondary | ICD-10-CM | POA: Diagnosis not present

## 2012-09-09 LAB — POTASSIUM: Potassium: 3.6 mmol/L (ref 3.5–5.1)

## 2012-09-10 LAB — PATHOLOGY REPORT

## 2012-09-12 ENCOUNTER — Telehealth: Payer: Self-pay | Admitting: *Deleted

## 2012-09-12 NOTE — Telephone Encounter (Signed)
Pt says she had sinus surgery 4/28 Since surg she has noticed new chest discomfort that is intermittent and is associated with nausea and dizziness. No active chest pain/discomfirt at the moment BP=108/62, HR="normal" Worked in with PA tomm at 1:30 I advised her to go to ER prior to appt should symptoms change/get worse Understanding verb

## 2012-09-12 NOTE — Telephone Encounter (Signed)
Pt calling stating that she is having some chest discomfort

## 2012-09-13 ENCOUNTER — Ambulatory Visit (INDEPENDENT_AMBULATORY_CARE_PROVIDER_SITE_OTHER): Payer: Medicare Other | Admitting: Physician Assistant

## 2012-09-13 ENCOUNTER — Encounter: Payer: Self-pay | Admitting: Physician Assistant

## 2012-09-13 VITALS — BP 94/62 | HR 94 | Ht 65.5 in | Wt 151.5 lb

## 2012-09-13 DIAGNOSIS — R0789 Other chest pain: Secondary | ICD-10-CM

## 2012-09-13 DIAGNOSIS — I509 Heart failure, unspecified: Secondary | ICD-10-CM

## 2012-09-13 DIAGNOSIS — R1013 Epigastric pain: Secondary | ICD-10-CM | POA: Diagnosis not present

## 2012-09-13 DIAGNOSIS — E7800 Pure hypercholesterolemia, unspecified: Secondary | ICD-10-CM

## 2012-09-13 DIAGNOSIS — R55 Syncope and collapse: Secondary | ICD-10-CM | POA: Insufficient documentation

## 2012-09-13 DIAGNOSIS — I5022 Chronic systolic (congestive) heart failure: Secondary | ICD-10-CM | POA: Diagnosis not present

## 2012-09-13 NOTE — Patient Instructions (Addendum)
Please follow-up with your primary doctor as scheduled.   Will plan to get an abdominal ultrasound.  This is scheduled for Tuesday Sep 17, 2012 at 8:00 am.  Please arrive at 7:45 am to [facility].  You may have nothing to eat or drink after midnight the night before procedure.  We will obtain labwork today as well. You will be notified of any abnormalities.   Please hold your hydralazine and continue to monitor your BP. Please call the office if you are consistently recording readings less 100/80 or feeling lightheadedness.

## 2012-09-13 NOTE — Assessment & Plan Note (Signed)
Adbominal is tender to palpation on exam. Aggravated by position. No appreciable mass or hernia on exam. Will rule out GI process. Plan for abdominal u/s, comprehensive metabolic panel, lipase. Advised follow-up PCP and GI.

## 2012-09-13 NOTE — Assessment & Plan Note (Signed)
Euvolemic on exam. Maintaining baseline weight 150-153 lbs. She is very diligent about adjusting diuretic management based on weight. No CHF-type symptoms endorsed today.

## 2012-09-13 NOTE — Assessment & Plan Note (Signed)
BPs have been running low (80/50) on occasion. Will hold hydralazine. Advised to continue to monitor BP and presyncopal symptoms. Rise from seated or laying positions slowly.

## 2012-09-13 NOTE — Progress Notes (Signed)
Patient ID: Ashley [name], female    DOB: 02-May-1946, 67 y.o.   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   Previous  [employer] 2012 showed no significant ischemia, no EKG changes concerning for ischemia.   ACE inhibitor on hold secondary to chronic cough.  Baseline weight 150-153 lbs. She has been managing her weight well with torsemide and metolazone. Previous Cardiac MRI showed ejection fraction 34%, no evidence of ischemia or scar. LV dilatation c/w nonischemic cardiomyopathy.  ICD placed  by Dr. Ladona Ridgel.  She was seen approximately one month ago in the office. She had been complaining of palpitations. Increasing BB was recommended. She was cleared for nasal sinus surgery. She was noted to be otherwise euvolemic and stable from a cardiac perspective.   She presents today complaining of new-onset epigastric discomfort radiating to her back at times, worse with palpation and position changes. No aggravation with meals. No associated belching or water brash. She does have a history of GERD, and has been told once or twice that she may have a hiatal hernia. She has worn 24 hour pH monitor in the past which was normal. No n/v/d or blood stools. She does have a history of diverticulitis. No recent strenuous activity or muscle strain. Denies exertional chest pain, SOB/DOE, PND, orthopnea. Weight has been stable between 150-153 lbs. Does note an instance of lightheadedness and presyncope. No frank syncope. Blood pressures have been low (80/50s) at times.   Problems in the past on Lipitor with cramping. Started on pravastatin. She has stopped this due to  cramping and bruising. She underwent sinus surgery. Tolerated this well. Still with some morning epistaxis and chronic cough.   EKG shows normal sinus rhythm with rate 94 beats per minute with no ST/T changes   Outpatient Encounter Prescriptions as of 09/13/2012  Medication Sig Dispense Refill  . ALPRAZolam (XANAX) 0.25 MG tablet Take 0.25 mg by mouth daily. Pt states trying to taper off med, taking 1.5 tablets per day currently      . aspirin 81 MG EC tablet Take 81 mg by mouth at bedtime.       Marland Kitchen CALCIUM-VITAMIN D PO Take 1 capsule by mouth daily.      . carvedilol (COREG) 12.5 MG tablet Takes 2 tablets twice daily.      . Coenzyme Q10 (COQ-10) 150 MG CAPS Take 1 tablet by mouth daily.      Marland Kitchen dextromethorphan (DELSYM) 30 MG/5ML liquid Take 60 mg by mouth every 12 (twelve) hours as needed. For cough/congestion.      . hydrALAZINE (APRESOLINE) 25 MG tablet Takes 0.5 tablet daily.  45 tablet  3  . levofloxacin (LEVAQUIN) 500 MG tablet Take 500 mg by mouth daily.      . metolazone (ZAROXOLYN) 2.5 MG tablet Take 1 tablet (2.5 mg total) by mouth every other day. Take one tablet every other day before torsemide  45 tablet  3  . mometasone (NASONEX) 50 MCG/ACT nasal spray Place 1 spray into the nose 2 (two) times daily.       Marland Kitchen omeprazole (PRILOSEC) 40 MG capsule Take 40 mg by mouth 2 (two) times daily.      Marland Kitchen  potassium chloride (K-DUR) 10 MEQ tablet Take 1-4 tablets (10-40 mEq total) by mouth daily as needed. for every 10mg  toresmide  120 tablet  11  . pravastatin (PRAVACHOL) 80 MG tablet Take 80 mg by mouth as needed.       Marland Kitchen spironolactone (ALDACTONE) 25 MG tablet Take 1 tablet by mouth daily.      Marland Kitchen torsemide (DEMADEX) 10 MG tablet 1-4 tablets daily depending on weight (goal 150-153lbs)      . vitamin C (ASCORBIC ACID) 500 MG tablet Take 500 mg by mouth 2 (two) times daily.       Marland Kitchen zolpidem (AMBIEN) 5 MG tablet Take 0.5-1 tablets (2.5-5 mg total) by mouth at bedtime as needed. For insomnia   30 tablet  0  . [DISCONTINUED] DULoxetine (CYMBALTA) 20 MG capsule Take 1 capsule (20 mg total) by mouth daily.  30 capsule  6   No facility-administered encounter medications on file as of 09/13/2012.    Review of Systems  Constitutional: Negative.   HENT: Positive for nosebleeds, congestion and sinus pressure.   Eyes: Negative.   Respiratory: Positive for cough.   Cardiovascular: Positive for palpitations.  Gastrointestinal: Positive for nausea and abdominal pain. Negative for vomiting, diarrhea and blood in stool.  Musculoskeletal: Positive for back pain.  Skin: Negative.   Neurological: Positive for dizziness and light-headedness.  Psychiatric/Behavioral: Negative.   All other systems reviewed and are negative.     BP 94/62  Pulse 94  Ht 5' 5.5" (1.664 m)  Wt 151 lb 8 oz (68.72 kg)  BMI 24.82 kg/m2  Physical Exam  Nursing note and vitals reviewed. Constitutional: She is oriented to person, place, and time. She appears well-developed and well-nourished.  HENT:  Head: Normocephalic.  Nose: Nose normal.  Mouth/Throat: Oropharynx is clear and moist.  Eyes: Conjunctivae are normal. Pupils are equal, round, and reactive to light.  Neck: Normal range of motion. Neck supple. No JVD present.  Cardiovascular: Normal rate, regular rhythm, S1 normal, S2 normal, normal heart sounds and intact distal pulses.  Exam reveals no gallop and no friction rub.   No murmur heard. Pulmonary/Chest: Effort normal and breath sounds normal. No respiratory distress. She has no wheezes. She has no rales. She exhibits no tenderness.  Abdominal: Soft. Bowel sounds are normal. She exhibits no distension and no mass. There is tenderness.  Musculoskeletal: Normal range of motion. She exhibits no edema and no tenderness.  Lymphadenopathy:    She has no cervical adenopathy.  Neurological: She is alert and oriented to person, place, and time. Coordination normal.  Skin: Skin is warm and dry. No rash noted.  No erythema.  Psychiatric: She has a normal mood and affect. Her behavior is normal. Judgment and thought content normal.    Assessment and Plan

## 2012-09-13 NOTE — Assessment & Plan Note (Signed)
Myalgias and bruising on pravastatin. This has been held, and would be her third statin to which she is intolerant. May need to consider alternative antihyperlipidemics.

## 2012-09-14 ENCOUNTER — Telehealth: Payer: Self-pay | Admitting: Physician Assistant

## 2012-09-14 LAB — COMPREHENSIVE METABOLIC PANEL
ALT: 17 IU/L (ref 0–32)
AST: 17 IU/L (ref 0–40)
Albumin/Globulin Ratio: 1.6 (ref 1.1–2.5)
Albumin: 4.6 g/dL (ref 3.6–4.8)
Alkaline Phosphatase: 123 IU/L — ABNORMAL HIGH (ref 39–117)
BUN/Creatinine Ratio: 18 (ref 11–26)
BUN: 30 mg/dL — ABNORMAL HIGH (ref 8–27)
CO2: 24 mmol/L (ref 19–28)
Calcium: 10 mg/dL (ref 8.6–10.2)
Chloride: 97 mmol/L (ref 97–108)
Creatinine, Ser: 1.63 mg/dL — ABNORMAL HIGH (ref 0.57–1.00)
GFR calc Af Amer: 37 mL/min/{1.73_m2} — ABNORMAL LOW (ref >59)
GFR calc non Af Amer: 32 mL/min/{1.73_m2} — ABNORMAL LOW (ref >59)
Globulin, Total: 2.9 g/dL (ref 1.5–4.5)
Glucose: 114 mg/dL — ABNORMAL HIGH (ref 65–99)
Potassium: 3.9 mmol/L (ref 3.5–5.2)
Sodium: 138 mmol/L (ref 134–144)
Total Bilirubin: 0.1 mg/dL (ref 0.0–1.2)
Total Protein: 7.5 g/dL (ref 6.0–8.5)

## 2012-09-14 LAB — LIPASE: Lipase: 55 U/L (ref 0–59)

## 2012-09-14 NOTE — Telephone Encounter (Signed)
Called the patient informing her of the results of the CMET and lipase obtained yesterday. LFTs, lipase and potassium were WNL. BUN 30/Cr 1.63. Cr ~ 1 month ago was 1.2, but has been as high as 2.16 five months ago. She has a narrow euvolemic window quantified as a weight between 150-153 lbs. Historically, a weight < 150 lbs has led to acute renal insufficiency, while a weight > 153 lbs has led to decompensated CHF. With this knowledge, she is very diligent about monitoring her volume status and adjusting her diuretic regimen accordingly. Advised to continue this and trend on the higher side of this range (152-153 lbs) to provide a little bit more volume and renal hydration as she appears to be mildly azotemic. She understood and agreed. She sees her PCP in about 2 weeks and will have repeat lab work done at that time to reassess.    Jacqulyn Bath, PA-C 09/14/2012 11:17 AM

## 2012-09-17 ENCOUNTER — Other Ambulatory Visit: Payer: Self-pay | Admitting: *Deleted

## 2012-09-17 ENCOUNTER — Ambulatory Visit: Payer: Self-pay | Admitting: Cardiovascular Disease

## 2012-09-17 DIAGNOSIS — J321 Chronic frontal sinusitis: Secondary | ICD-10-CM | POA: Diagnosis not present

## 2012-09-17 DIAGNOSIS — J323 Chronic sphenoidal sinusitis: Secondary | ICD-10-CM | POA: Diagnosis not present

## 2012-09-17 DIAGNOSIS — R0789 Other chest pain: Secondary | ICD-10-CM

## 2012-09-17 DIAGNOSIS — R1013 Epigastric pain: Secondary | ICD-10-CM

## 2012-09-17 DIAGNOSIS — J320 Chronic maxillary sinusitis: Secondary | ICD-10-CM | POA: Diagnosis not present

## 2012-09-20 ENCOUNTER — Other Ambulatory Visit: Payer: Self-pay | Admitting: Family Medicine

## 2012-09-20 NOTE — Telephone Encounter (Signed)
Ok to refill 

## 2012-09-20 NOTE — Telephone Encounter (Signed)
Rx called in as directed.   

## 2012-09-20 NOTE — Telephone Encounter (Signed)
plz phone in. 

## 2012-09-24 DIAGNOSIS — J321 Chronic frontal sinusitis: Secondary | ICD-10-CM | POA: Diagnosis not present

## 2012-09-24 DIAGNOSIS — J323 Chronic sphenoidal sinusitis: Secondary | ICD-10-CM | POA: Diagnosis not present

## 2012-09-24 DIAGNOSIS — J320 Chronic maxillary sinusitis: Secondary | ICD-10-CM | POA: Diagnosis not present

## 2012-10-02 ENCOUNTER — Encounter: Payer: Self-pay | Admitting: Cardiovascular Disease

## 2012-10-02 ENCOUNTER — Ambulatory Visit (INDEPENDENT_AMBULATORY_CARE_PROVIDER_SITE_OTHER): Payer: Medicare Other | Admitting: Cardiovascular Disease

## 2012-10-02 VITALS — BP 96/61 | HR 87 | Ht 65.5 in | Wt 153.2 lb

## 2012-10-02 DIAGNOSIS — I5022 Chronic systolic (congestive) heart failure: Secondary | ICD-10-CM | POA: Diagnosis not present

## 2012-10-02 DIAGNOSIS — R079 Chest pain, unspecified: Secondary | ICD-10-CM

## 2012-10-02 DIAGNOSIS — R5381 Other malaise: Secondary | ICD-10-CM

## 2012-10-02 DIAGNOSIS — I951 Orthostatic hypotension: Secondary | ICD-10-CM | POA: Diagnosis not present

## 2012-10-02 DIAGNOSIS — I509 Heart failure, unspecified: Secondary | ICD-10-CM

## 2012-10-02 DIAGNOSIS — R5383 Other fatigue: Secondary | ICD-10-CM | POA: Diagnosis not present

## 2012-10-02 MED ORDER — MIDODRINE HCL 2.5 MG PO TABS: 2.5000 mg | ORAL_TABLET | Freq: Three times a day (TID) | ORAL | Status: DC | PRN

## 2012-10-02 NOTE — Progress Notes (Signed)
Patient ID: Ashley [name], female    DOB: June 30, 1945, 67 y.o.   MRN: [id number]  HPI Comments: 67 y/o female with history of pneumonia, chronic cough felt secondary to GERD, abnormal PFTs in the past, shortness of breath dating back to September 2012 that presented acutely, CT scan showing progressive pleural effusion , echo showing Cardiomyopathy  ejection fraction estimated at 25-35%,  Previous  Lexiscan Myoview  showed no significant ischemia, no EKG changes concerning for ischemia.   Follow up admission to [facility] in December 2012 for bronchitis and hypoxia as well as systolic CHF. She had antibiotics, prednisone and diuresis and has felt better since discharge. ACE inhibitor placed on hold secondary to chronic cough.  S/P ICD 10 days ago for EF <35%.  Goal weight has been in the low 150 range. She has been managing her weight well with torsemide and metolazone. Previous Cardiac MRI showed ejection fraction 34% ICD placed  by Dr. Ladona Ridgel.  Recently she reports having low blood pressures, sometimes in the 80s, frequently in the 90s. In this range, she has general malaise and fatigue. She feels better when her blood pressure is higher. She has been taking torsemide 10 mg twice a day on a regular basis, holding her metolazone for stable weight, taking the metolazone for additional weight gain. No travel planned. Having work done in her backyard. She does report having week he Flank pain at times like a pinching. Abdominal ultrasound done in the past several weeks showed no abnormalities Recent basic metabolic panel showed creatinine 1.6 which is above her baseline  Problems in the past on Lipitor with cramping. Started on pravastatin   EKG shows normal sinus rhythm with rate 87 beats per minute with nonspecific ST abnormality      Outpatient Encounter Prescriptions as of 10/02/2012  Medication Sig Dispense Refill  . ALPRAZolam (XANAX) 0.25 MG tablet Take 0.25 mg by mouth  daily. Pt states trying to taper off med, taking 1.5 tablets per day currently      . aspirin 81 MG EC tablet Take 81 mg by mouth at bedtime.       Marland Kitchen CALCIUM-VITAMIN D PO Take 1 capsule by mouth daily.      . carvedilol (COREG) 12.5 MG tablet Takes 2 tablets twice daily.      . Coenzyme Q10 (COQ-10) 150 MG CAPS Take 1 tablet by mouth daily.      Marland Kitchen dextromethorphan (DELSYM) 30 MG/5ML liquid Take 60 mg by mouth as needed. For cough/congestion.      Marland Kitchen levofloxacin (LEVAQUIN) 500 MG tablet Take 500 mg by mouth as needed.       . metolazone (ZAROXOLYN) 2.5 MG tablet Take 2.5 mg by mouth as needed.      Marland Kitchen omeprazole (PRILOSEC) 40 MG capsule Take 40 mg by mouth 2 (two) times daily.      . potassium chloride (K-DUR) 10 MEQ tablet Take 1-4 tablets (10-40 mEq total) by mouth daily as needed. for every 10mg  toresmide  120 tablet  11  . pravastatin (PRAVACHOL) 80 MG tablet Take 80 mg by mouth as needed.       Marland Kitchen spironolactone (ALDACTONE) 25 MG tablet Take 1 tablet by mouth daily.      Marland Kitchen torsemide (DEMADEX) 10 MG tablet 1-4 tablets daily depending on weight (goal 150-153lbs)      . vitamin C (ASCORBIC ACID) 500 MG tablet Take 500 mg by mouth 2 (two) times daily.       Marland Kitchen  zolpidem (AMBIEN) 5 MG tablet TAKE 1/2 TO 1 TABLET AT BEDTIME AS NEEDED  30 tablet  0  . [DISCONTINUED] metolazone (ZAROXOLYN) 2.5 MG tablet Take 1 tablet (2.5 mg total) by mouth every other day. Take one tablet every other day before torsemide  45 tablet  3  . [DISCONTINUED] mometasone (NASONEX) 50 MCG/ACT nasal spray Place 1 spray into the nose 2 (two) times daily.        No facility-administered encounter medications on file as of 10/02/2012.    Review of Systems  Constitutional: Positive for fatigue.  HENT: Positive for sinus pressure.   Eyes: Negative.   Respiratory: Negative.   Gastrointestinal: Negative.   Musculoskeletal: Negative.   Skin: Negative.   Neurological: Negative.   Psychiatric/Behavioral: Negative.   All  other systems reviewed and are negative.     BP 96/61  Pulse 87  Ht 5' 5.5" (1.664 m)  Wt 153 lb 4 oz (69.514 kg)  BMI 25.11 kg/m2  Physical Exam  Nursing note and vitals reviewed. Constitutional: She is oriented to person, place, and time. She appears well-developed and well-nourished.  HENT:  Head: Normocephalic.  Nose: Nose normal.  Mouth/Throat: Oropharynx is clear and moist.  Eyes: Conjunctivae are normal. Pupils are equal, round, and reactive to light.  Neck: Normal range of motion. Neck supple. No JVD present.  Cardiovascular: Normal rate, regular rhythm, S1 normal, S2 normal, normal heart sounds and intact distal pulses.  Exam reveals no gallop and no friction rub.   No murmur heard. Pulmonary/Chest: Effort normal and breath sounds normal. No respiratory distress. She has no wheezes. She has no rales. She exhibits no tenderness.  Abdominal: Soft. Bowel sounds are normal. She exhibits no distension. There is no tenderness.  Musculoskeletal: Normal range of motion. She exhibits no edema and no tenderness.  Lymphadenopathy:    She has no cervical adenopathy.  Neurological: She is alert and oriented to person, place, and time. Coordination normal.  Skin: Skin is warm and dry. No rash noted. No erythema.  Psychiatric: She has a normal mood and affect. Her behavior is normal. Judgment and thought content normal.    Assessment and Plan

## 2012-10-02 NOTE — Assessment & Plan Note (Signed)
Recent malaise and fatigue possibly from overdiuresis. She feels better with better blood pressure. He we'll hold her diuretic for several days

## 2012-10-02 NOTE — Assessment & Plan Note (Signed)
We have given her a prescription for midodrine at very low dose and suggested she take it as needed for low blood pressures. We've also suggested for low blood pressure, she hold her diuretic for the day

## 2012-10-02 NOTE — Patient Instructions (Addendum)
You are doing well. Please hold the torsemide for a few days until you feel better  Please take midodrine as needed for low blood pressure  Please call us if you have new issues that need to be addressed before your next appt.  Your physician wants you to follow-up in: 6 months.  You will receive a reminder letter in the mail two months in advance. If you don't receive a letter, please call our office to schedule the follow-up appointment.

## 2012-10-02 NOTE — Assessment & Plan Note (Signed)
Weight is stable, in fact blood pressure low, creatinine elevated, I think she has been taking excessive diuretic. We have suggested she hold her diuretic for several days until blood pressure comes back up in she starts feeling better. Overall she has been very strict with her medications and diet and doing well.

## 2012-10-04 DIAGNOSIS — L280 Lichen simplex chronicus: Secondary | ICD-10-CM | POA: Diagnosis not present

## 2012-10-04 DIAGNOSIS — L570 Actinic keratosis: Secondary | ICD-10-CM | POA: Diagnosis not present

## 2012-10-04 DIAGNOSIS — L909 Atrophic disorder of skin, unspecified: Secondary | ICD-10-CM | POA: Diagnosis not present

## 2012-10-04 DIAGNOSIS — L919 Hypertrophic disorder of the skin, unspecified: Secondary | ICD-10-CM | POA: Diagnosis not present

## 2012-10-04 DIAGNOSIS — B079 Viral wart, unspecified: Secondary | ICD-10-CM | POA: Diagnosis not present

## 2012-10-04 DIAGNOSIS — B372 Candidiasis of skin and nail: Secondary | ICD-10-CM | POA: Diagnosis not present

## 2012-10-04 DIAGNOSIS — L821 Other seborrheic keratosis: Secondary | ICD-10-CM | POA: Diagnosis not present

## 2012-10-08 DIAGNOSIS — H6120 Impacted cerumen, unspecified ear: Secondary | ICD-10-CM | POA: Diagnosis not present

## 2012-10-08 DIAGNOSIS — J320 Chronic maxillary sinusitis: Secondary | ICD-10-CM | POA: Diagnosis not present

## 2012-10-08 DIAGNOSIS — J321 Chronic frontal sinusitis: Secondary | ICD-10-CM | POA: Diagnosis not present

## 2012-10-08 DIAGNOSIS — J323 Chronic sphenoidal sinusitis: Secondary | ICD-10-CM | POA: Diagnosis not present

## 2012-10-09 ENCOUNTER — Other Ambulatory Visit: Payer: Self-pay | Admitting: Family Medicine

## 2012-10-09 NOTE — Telephone Encounter (Signed)
plz phone in. 

## 2012-10-09 NOTE — Telephone Encounter (Signed)
Pt left v/m requesting refill alprazolam to [facility] today;pt going out of town.Please advise.

## 2012-10-10 NOTE — Telephone Encounter (Signed)
Rx called in as directed.   

## 2012-10-17 DIAGNOSIS — L821 Other seborrheic keratosis: Secondary | ICD-10-CM | POA: Diagnosis not present

## 2012-10-17 DIAGNOSIS — D485 Neoplasm of uncertain behavior of skin: Secondary | ICD-10-CM | POA: Diagnosis not present

## 2012-10-17 DIAGNOSIS — B079 Viral wart, unspecified: Secondary | ICD-10-CM | POA: Diagnosis not present

## 2012-10-17 DIAGNOSIS — L570 Actinic keratosis: Secondary | ICD-10-CM | POA: Diagnosis not present

## 2012-10-21 ENCOUNTER — Encounter: Payer: Self-pay | Admitting: Radiology

## 2012-10-21 DIAGNOSIS — IMO0002 Reserved for concepts with insufficient information to code with codable children: Secondary | ICD-10-CM | POA: Diagnosis not present

## 2012-10-21 DIAGNOSIS — M999 Biomechanical lesion, unspecified: Secondary | ICD-10-CM | POA: Diagnosis not present

## 2012-10-21 DIAGNOSIS — M5137 Other intervertebral disc degeneration, lumbosacral region: Secondary | ICD-10-CM | POA: Diagnosis not present

## 2012-10-22 ENCOUNTER — Encounter: Payer: Self-pay | Admitting: Family Medicine

## 2012-10-22 ENCOUNTER — Ambulatory Visit (INDEPENDENT_AMBULATORY_CARE_PROVIDER_SITE_OTHER): Payer: Medicare Other | Admitting: Family Medicine

## 2012-10-22 ENCOUNTER — Ambulatory Visit (INDEPENDENT_AMBULATORY_CARE_PROVIDER_SITE_OTHER): Payer: Medicare Other | Admitting: Internal Medicine

## 2012-10-22 ENCOUNTER — Encounter: Payer: Self-pay | Admitting: Internal Medicine

## 2012-10-22 VITALS — BP 110/84 | HR 82 | Temp 97.9°F | Wt 155.5 lb

## 2012-10-22 VITALS — BP 107/63 | HR 84 | Ht 65.5 in | Wt 157.0 lb

## 2012-10-22 DIAGNOSIS — F329 Major depressive disorder, single episode, unspecified: Secondary | ICD-10-CM | POA: Diagnosis not present

## 2012-10-22 DIAGNOSIS — I5022 Chronic systolic (congestive) heart failure: Secondary | ICD-10-CM

## 2012-10-22 DIAGNOSIS — R5381 Other malaise: Secondary | ICD-10-CM

## 2012-10-22 DIAGNOSIS — I951 Orthostatic hypotension: Secondary | ICD-10-CM

## 2012-10-22 DIAGNOSIS — I428 Other cardiomyopathies: Secondary | ICD-10-CM

## 2012-10-22 DIAGNOSIS — L609 Nail disorder, unspecified: Secondary | ICD-10-CM

## 2012-10-22 DIAGNOSIS — R5383 Other fatigue: Secondary | ICD-10-CM | POA: Diagnosis not present

## 2012-10-22 DIAGNOSIS — I509 Heart failure, unspecified: Secondary | ICD-10-CM

## 2012-10-22 DIAGNOSIS — N179 Acute kidney failure, unspecified: Secondary | ICD-10-CM

## 2012-10-22 DIAGNOSIS — F32A Depression, unspecified: Secondary | ICD-10-CM

## 2012-10-22 DIAGNOSIS — N189 Chronic kidney disease, unspecified: Secondary | ICD-10-CM

## 2012-10-22 LAB — ICD DEVICE OBSERVATION
BATTERY VOLTAGE: 3.1773 V
BRDY-0002RV: 40 {beats}/min
CHARGE TIME: 8.698 s
DEV-0020ICD: NEGATIVE
FVT: 0
HV IMPEDENCE: 85 Ohm
PACEART VT: 0
RV LEAD AMPLITUDE: 10.625 mv
RV LEAD IMPEDENCE ICD: 684 Ohm
RV LEAD THRESHOLD: 0.5 V
TOT-0001: 0
TOT-0002: 0
TOT-0006: 20130301000000
TZAT-0001FASTVT: 1
TZAT-0001SLOWVT: 1
TZAT-0002FASTVT: NEGATIVE
TZAT-0002SLOWVT: NEGATIVE
TZAT-0012FASTVT: 170 ms
TZAT-0012SLOWVT: 170 ms
TZAT-0018FASTVT: NEGATIVE
TZAT-0018SLOWVT: NEGATIVE
TZAT-0019FASTVT: 8 V
TZAT-0019SLOWVT: 8 V
TZAT-0020FASTVT: 1.5 ms
TZAT-0020SLOWVT: 1.5 ms
TZON-0003SLOWVT: 360 ms
TZON-0003VSLOWVT: 450 ms
TZON-0004SLOWVT: 16
TZON-0004VSLOWVT: 20
TZON-0005SLOWVT: 12
TZST-0001FASTVT: 2
TZST-0001FASTVT: 3
TZST-0001FASTVT: 4
TZST-0001FASTVT: 5
TZST-0001FASTVT: 6
TZST-0001SLOWVT: 2
TZST-0001SLOWVT: 3
TZST-0001SLOWVT: 4
TZST-0001SLOWVT: 5
TZST-0001SLOWVT: 6
TZST-0002FASTVT: NEGATIVE
TZST-0002FASTVT: NEGATIVE
TZST-0002FASTVT: NEGATIVE
TZST-0002FASTVT: NEGATIVE
TZST-0002FASTVT: NEGATIVE
TZST-0002SLOWVT: NEGATIVE
TZST-0002SLOWVT: NEGATIVE
TZST-0002SLOWVT: NEGATIVE
TZST-0002SLOWVT: NEGATIVE
TZST-0002SLOWVT: NEGATIVE
VENTRICULAR PACING ICD: 0.01 pct
VF: 0

## 2012-10-22 LAB — RENAL FUNCTION PANEL
Albumin: 4.8 g/dL (ref 3.5–5.2)
BUN: 35 mg/dL — ABNORMAL HIGH (ref 6–23)
CO2: 28 mEq/L (ref 19–32)
Calcium: 10.8 mg/dL — ABNORMAL HIGH (ref 8.4–10.5)
Chloride: 103 mEq/L (ref 96–112)
Creatinine, Ser: 1.4 mg/dL — ABNORMAL HIGH (ref 0.4–1.2)
GFR: 39.51 mL/min — ABNORMAL LOW (ref >60.00)
Glucose, Bld: 95 mg/dL (ref 70–99)
Phosphorus: 3.7 mg/dL (ref 2.3–4.6)
Potassium: 4.7 mEq/L (ref 3.5–5.1)
Sodium: 142 mEq/L (ref 135–145)

## 2012-10-22 MED ORDER — CARVEDILOL 12.5 MG PO TABS: 6.2500 mg | ORAL_TABLET | Freq: Two times a day (BID) | ORAL | Status: DC

## 2012-10-22 NOTE — Assessment & Plan Note (Signed)
Thought 2/2 over agressive diuresis - recheck Cr today. New goal weight is 154-155lbs.

## 2012-10-22 NOTE — Patient Instructions (Addendum)
Blood work today. You are doing well today. Start biotin - nail vitamin - daily. Good to see you today, call us with questions.

## 2012-10-22 NOTE — Progress Notes (Signed)
HPI Ashley [name] returns today for followup. She is a pleasant 67 yo woman with an DCM, chronic systolic CHF, s/p ICD implant. In the interim, she has had problems with hypotension and fatigue. She is always had chest discomfort which is almost certainly related to gastroesophageal reflux disease. She has class II heart failure symptoms. In the interim, she has had no ICD shocks. Allergies  Allergen Reactions  . Antihistamines, Chlorpheniramine-Type Other (See Comments)    Makes her eyes look like she sees strobe lights  . Influenza Vac Split (Flu Virus Vaccine) Other (See Comments)    "allergic to concentrated eggs that are in vaccine"  . Penicillins     REACTION: "told 50 years ago I couldn't take it; might be immune to it", able to take amoxicillin  . Sulfasalazine Other (See Comments)    "makes my whole body smell like sulfa & makes me sick just smelling it"  . Atorvastatin Other (See Comments)    Severe muscle cramps to generic atorvastatin.  Able to take brand lipitor.  . Latex Nausea Only and Rash  . Rosuvastatin Other (See Comments)    Was not effective controlling lipids  . Cymbalta (Duloxetine Hcl) Other (See Comments)    "body spasms and made me feel weird in my head"   . Red Dye     Nausea, swelling     Current Outpatient Prescriptions  Medication Sig Dispense Refill  . ALPRAZolam (XANAX) 0.25 MG tablet TAKE 1 TABLET TWICE A DAY  60 tablet  0  . aspirin 81 MG EC tablet Take 81 mg by mouth at bedtime.       Marland Kitchen CALCIUM-VITAMIN D PO Take 1 capsule by mouth daily.      . carvedilol (COREG) 12.5 MG tablet Take 12.5 mg by mouth. 2 pills once a day      . Coenzyme Q10 (COQ-10) 150 MG CAPS Take 1 tablet by mouth daily.      Marland Kitchen dextromethorphan (DELSYM) 30 MG/5ML liquid Take 60 mg by mouth as needed. For cough/congestion.      . metolazone (ZAROXOLYN) 2.5 MG tablet Take 2.5 mg by mouth as needed.      . midodrine (PROAMATINE) 2.5 MG tablet Take 1 tablet (2.5 mg total) by mouth 3  (three) times daily as needed.  90 tablet  6  . omeprazole (PRILOSEC) 40 MG capsule Take 40 mg by mouth 2 (two) times daily.      . potassium chloride (K-DUR) 10 MEQ tablet Take 1-4 tablets (10-40 mEq total) by mouth daily as needed. for every 10mg  toresmide  120 tablet  11  . pravastatin (PRAVACHOL) 80 MG tablet Take 1 tablet (80 mg total) by mouth every 3 (three) days.      Marland Kitchen spironolactone (ALDACTONE) 25 MG tablet Take 1 tablet by mouth daily.      Marland Kitchen torsemide (DEMADEX) 10 MG tablet 1-4 tablets daily depending on weight (goal 154-155lbs)      . vitamin C (ASCORBIC ACID) 500 MG tablet Take 500 mg by mouth 2 (two) times daily.       Marland Kitchen zolpidem (AMBIEN) 5 MG tablet TAKE 1/2 TO 1 TABLET AT BEDTIME AS NEEDED  30 tablet  0  . [DISCONTINUED] bisoprolol (ZEBETA) 5 MG tablet Take 5 mg by mouth daily.       No current facility-administered medications for this visit.     Past Medical History  Diagnosis Date  . Migraine without aura, without mention of intractable migraine without  mention of status migrainosus   . History of diverticulitis of colon   . Pure hypercholesterolemia   . Irritable bowel syndrome   . Laryngeal nodule     Dr. Thelma Comp  . Fibrocystic breast disease   . Osteoarthritis   . Hemorrhoids   . Anxiety   . GERD (gastroesophageal reflux disease)   . Systolic CHF     EF 25-35%, LV dilation, significant MR, goal weight 150lbs  . Nonischemic cardiomyopathy   . History of pneumonia 2012  . Mitral regurgitation   . Anxiety   . Bronchiectasis     possibly mild, treat URIs aggressively  . Hypertension   . Heart murmur   . Angina   . Lung disease 07/14/11  . Sleep apnea   . Shortness of breath     "resting"  . Anemia   . Fibromyalgia   . Depression   . Disorder of vocal cords   . Chronic sinusitis     ROS:   All systems reviewed and negative except as noted in the HPI.   Past Surgical History  Procedure Laterality Date  . Appendectomy    . Mandible surgery       "got about 6 pins in the bottom of my jaw"  . Knee arthroscopy w/ orif      Left; "meniscus tear"  . Osteotomy      Left foot  . 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 Dexa  03/28/99    osteopenia L/S  . Colonoscopy  1998    nml (sigmoid-Gilbert-nol)  . Head mri, head ct  10/1998    ? H/A focus ([facility])  . Emg/mcv  10/16/01    + mild carpal tunnel  . Dexa  03/12/03    2.2 Spine 0.1 Hip  Osteopenia  . Mri right hip  12/12/01    Tendonitis Gluteus Medius to Gtr Trochanter Neg Bursitis (Dr. Montez Morita)  . L/s films  11/21/01    nml; Right hip nml  . Mri u/s  11/21/01    min. DDD L/3-4  . Colonoscopy  01/04/04    polyps, diverticulosis/severe  . Dexa  11/14/05    1.9 Spine 0.3 Hip  slight improvement  . Flex laryngoscopy  06/11/06    ([facility]) nml  . Dexa  11/22/06    improved osteo and fem neck  . [employer] bunionectomy  11/04/08    Right Great Toe (Dr. Lestine Box)  . Breast mass excision  01/2010    Left-fibrocystic change w/intraductal papilloma, no malignancy  . Ct chest  12/2010    possible bronchiectasis lower lobes, 02/2011 - enlarged bilateral effusions, bibasilar atx  . Stress myoview  02/2011    no significant ischemia  . Ct chest  04/2011    no PE.  abnormal R hilar and mediastinal adenopathy --> on rpt 05/2011, resolved  . US echocardiography  04/2011    mildly dilated LV, EF 25-30%, hypokinesis, restrictive physiology, mod MR, no AS  . Icd placement  07/14/11    w/pacemaker  . Tonsillectomy and adenoidectomy    . Breast biopsy      "I've had 2-3 bx; all benign"  . Augmentation mammaplasty    . 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 Abdominal hysterectomy  1987    w/BSO  . Lasik      left eye  . Knee surgery  left  . Mandible surgery    . Sinus surgery      x3 with balloon      Family History  Problem Relation Age of Onset  . Lung cancer Father     + smoker  . Dementia Mother   . Osteoporosis Mother     Lumbar  spine  . Stroke Mother     x 5 @ 91 YOA  . Arthritis Mother     hands  . Emphysema Mother   . Alcohol abuse Paternal Grandfather   . Hypertension Maternal Grandfather     ?  . Stroke Paternal Grandfather     ?  Marland Kitchen Heart disease      grandmother  . Heart disease      grandfather     History   Social History  . Marital Status: Married    Spouse Name: N/A    Number of Children: 1  . Years of Education: N/A   Occupational History  . 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 Makes [employer]    Social History Main Topics  . Smoking status: Never Smoker   . Smokeless tobacco: Never Used     Comment: Lived with smokers x 23 yrs, smoked herself "for a week"  . Alcohol Use: No  . Drug Use: No  . Sexually Active: Not Currently   Other Topics Concern  . Not on file   Social History Narrative   1 adopted child      Lives with husband              There were no vitals taken for this visit.  Physical Exam:  Well appearing NAD HEENT: Unremarkable Neck:  No JVD, no thyromegally Back:  No CVA tenderness Lungs:  Clear with no wheezes, rales, or rhonchi. HEART:  Regular rate rhythm, no murmurs, no rubs, no clicks Abd:  soft, positive bowel sounds, no organomegally, no rebound, no guarding Ext:  2 plus pulses, no edema, no cyanosis, no clubbing Skin:  No rashes no nodules Neuro:  CN II through XII intact, motor grossly intact  DEVICE  Normal device function.  See PaceArt for details.   Assess/Plan:

## 2012-10-22 NOTE — Assessment & Plan Note (Signed)
Workup unrevealing overall.  May be from baseline cardiac disease.

## 2012-10-22 NOTE — Patient Instructions (Addendum)
Your physician wants you to follow-up in: 12 months with Dr Court Joy will receive a reminder letter in the mail two months in advance. If you don't receive a letter, please call our office to schedule the follow-up appointment.  Remote monitoring is used to monitor your Pacemaker of ICD from home. This monitoring reduces the number of office visits required to check your device to one time per year. It allows [place] to keep an eye on the functioning of your device to ensure it is working properly. You are scheduled for a device check from home on 01/27/13. You may send your transmission at any time that day. If you have a wireless device, the transmission will be sent automatically. After your physician reviews your transmission, you will receive a postcard with your next transmission date.  Your physician has recommended you make the following change in your medication:  1) decrease Carvedilol to 1/2 tablet (6.25mg )twice daily

## 2012-10-22 NOTE — Progress Notes (Signed)
  Subjective:    Patient ID: Ashley [name], female    DOB: [birth year], 67 y.o.   MRN: [id number]  HPI CC: 3 mo f/u  Depression/fibromyalgia - cymbalta 20mg  caused body jerks, so decided to stop.  3.5 wk trial of this.  Feels mood overall stable.  Decreasing xanax - some days doesn't take at all.    Systolic CHF from nonischemic CM - she is very meticulous about checking daily weights.  Goal wight is 154-155lbs.  Taking extra torsemide daily as needed.   Started on midodrine 2.5mg  to use if BP staying elevated.   She was recently on prednisone and wonders if weight gain due to this.  Would like toenails and thumbnails evaluated.  Wt Readings from Last 3 Encounters:  10/22/12 155 lb 8 oz (70.534 kg)  10/02/12 153 lb 4 oz (69.514 kg)  09/13/12 151 lb 8 oz (68.72 kg)   BP Readings from Last 3 Encounters:  10/22/12 110/84  10/02/12 96/61  09/13/12 94/62   Past Medical History  Diagnosis Date  . Migraine without aura, without mention of intractable migraine without mention of status migrainosus   . History of diverticulitis of colon   . Pure hypercholesterolemia   . Irritable bowel syndrome   . Laryngeal nodule     Dr. Thelma Comp  . Fibrocystic breast disease   . Osteoarthritis   . Hemorrhoids   . Anxiety   . GERD (gastroesophageal reflux disease)   . Systolic CHF     EF 25-35%, LV dilation, significant MR, goal weight 150lbs  . Nonischemic cardiomyopathy   . History of pneumonia 2012  . Mitral regurgitation   . Anxiety   . Bronchiectasis     possibly mild, treat URIs aggressively  . Hypertension   . Heart murmur   . Angina   . Lung disease 07/14/11  . Sleep apnea   . Shortness of breath     "resting"  . Anemia   . Fibromyalgia   . Depression   . Disorder of vocal cords   . Chronic sinusitis      Review of Systems Per HPI    Objective:   Physical Exam  Nursing note and vitals reviewed. Constitutional: She appears well-developed and well-nourished. No distress.   HENT:  Head: Normocephalic and atraumatic.  Mouth/Throat: Oropharynx is clear and moist. No oropharyngeal exudate.  Eyes: Conjunctivae and EOM are normal. Pupils are equal, round, and reactive to light. No scleral icterus.  Neck: Normal range of motion. Neck supple.  Cardiovascular: Normal rate, regular rhythm, normal heart sounds and intact distal pulses.   No murmur heard. Pulmonary/Chest: Effort normal and breath sounds normal. No respiratory distress. She has no wheezes. She has no rales.  Musculoskeletal: She exhibits no edema.  Some pitting of thumbnails at hand as well as brittle nails Mild onycholysis of toenails  Lymphadenopathy:    She has no cervical adenopathy.  Skin: Skin is warm and dry. No rash noted.  Psychiatric: She has a normal mood and affect.       Assessment & Plan:

## 2012-10-22 NOTE — Assessment & Plan Note (Signed)
Her blood pressure remains on the low side. We have reduced dose of her beta blocker. If she may ultimately require a reduction of her diuretic as well.

## 2012-10-22 NOTE — Assessment & Plan Note (Signed)
Has been started on midodrine prn hypotension as she feels better when bp above 100 systolic.  Hydralazine has been stopped.

## 2012-10-22 NOTE — Assessment & Plan Note (Signed)
Her heart failure symptoms are class II. Her blood pressure tends to be low. I've asked the patient to reduce the dose of her carvedilol to 6.25 mg twice daily.

## 2012-10-22 NOTE — Assessment & Plan Note (Signed)
Overall stable mood.  Did not tolerate cymbalta. Will continue to monitor off antidepressant.

## 2012-10-22 NOTE — Assessment & Plan Note (Signed)
rec f/u with derm for this, in interim start biotin vitamin.

## 2012-10-29 DIAGNOSIS — L820 Inflamed seborrheic keratosis: Secondary | ICD-10-CM | POA: Diagnosis not present

## 2012-10-29 DIAGNOSIS — L570 Actinic keratosis: Secondary | ICD-10-CM | POA: Diagnosis not present

## 2012-10-29 DIAGNOSIS — D2370 Other benign neoplasm of skin of unspecified lower limb, including hip: Secondary | ICD-10-CM | POA: Diagnosis not present

## 2012-10-31 DIAGNOSIS — M999 Biomechanical lesion, unspecified: Secondary | ICD-10-CM | POA: Diagnosis not present

## 2012-10-31 DIAGNOSIS — IMO0002 Reserved for concepts with insufficient information to code with codable children: Secondary | ICD-10-CM | POA: Diagnosis not present

## 2012-10-31 DIAGNOSIS — M5137 Other intervertebral disc degeneration, lumbosacral region: Secondary | ICD-10-CM | POA: Diagnosis not present

## 2012-11-05 ENCOUNTER — Telehealth: Payer: Self-pay

## 2012-11-05 MED ORDER — PAROXETINE HCL 10 MG PO TABS: 10.0000 mg | ORAL_TABLET | ORAL | Status: DC

## 2012-11-05 NOTE — Telephone Encounter (Signed)
May try paxil 10mg  daily - sent into pharmacy

## 2012-11-05 NOTE — Telephone Encounter (Signed)
Pt left v/m, was seen 10/22/12; pt previously tried Cymbalta but gave pt "the Jerks". Pt is now feeling depressed again, trying not to cry.Pt request mild med for depression sent to [facility] [address].Please advise.

## 2012-11-06 DIAGNOSIS — IMO0002 Reserved for concepts with insufficient information to code with codable children: Secondary | ICD-10-CM | POA: Diagnosis not present

## 2012-11-06 DIAGNOSIS — M5137 Other intervertebral disc degeneration, lumbosacral region: Secondary | ICD-10-CM | POA: Diagnosis not present

## 2012-11-06 DIAGNOSIS — M999 Biomechanical lesion, unspecified: Secondary | ICD-10-CM | POA: Diagnosis not present

## 2012-11-07 NOTE — Telephone Encounter (Signed)
Pt notified Rx sent to pharmacy

## 2012-11-12 ENCOUNTER — Other Ambulatory Visit: Payer: Self-pay | Admitting: Family Medicine

## 2012-11-12 NOTE — Telephone Encounter (Signed)
Ok to refill 

## 2012-11-13 ENCOUNTER — Other Ambulatory Visit: Payer: Self-pay | Admitting: Family Medicine

## 2012-11-13 DIAGNOSIS — IMO0002 Reserved for concepts with insufficient information to code with codable children: Secondary | ICD-10-CM | POA: Diagnosis not present

## 2012-11-13 DIAGNOSIS — M999 Biomechanical lesion, unspecified: Secondary | ICD-10-CM | POA: Diagnosis not present

## 2012-11-13 DIAGNOSIS — M5137 Other intervertebral disc degeneration, lumbosacral region: Secondary | ICD-10-CM | POA: Diagnosis not present

## 2012-11-13 NOTE — Telephone Encounter (Signed)
Rx called in as directed.   

## 2012-11-19 ENCOUNTER — Telehealth: Payer: Self-pay

## 2012-11-19 ENCOUNTER — Ambulatory Visit (INDEPENDENT_AMBULATORY_CARE_PROVIDER_SITE_OTHER): Payer: Medicare Other | Admitting: Pulmonary Disease

## 2012-11-19 VITALS — BP 120/78 | HR 74 | Ht 65.0 in | Wt 157.0 lb

## 2012-11-19 DIAGNOSIS — I509 Heart failure, unspecified: Secondary | ICD-10-CM | POA: Diagnosis not present

## 2012-11-19 DIAGNOSIS — I5022 Chronic systolic (congestive) heart failure: Secondary | ICD-10-CM

## 2012-11-19 DIAGNOSIS — R05 Cough: Secondary | ICD-10-CM

## 2012-11-19 DIAGNOSIS — R059 Cough, unspecified: Secondary | ICD-10-CM

## 2012-11-19 NOTE — Assessment & Plan Note (Signed)
Her dry weight is now 155 pounds. She is doing well with her torsemide management and other meds managed by Dr. Mariah Milling.

## 2012-11-19 NOTE — Assessment & Plan Note (Signed)
This has been a stable interval for [place]. She is doing much better with cough after her sinus surgery. Her cough is really minimal as is for wheezing, so for now I see no reason to move forward with a further workup.  She needs to continue to try to manage gastroesophageal reflux disease with lifestyle modification.  Followup in 6 months.

## 2012-11-19 NOTE — Telephone Encounter (Signed)
Prior auth needed Ambien; spoke with  [place] at [phone] approved over phone 09/19/12 - 11/19/13 with insomnia 780.52. [facility] [address] [facility] rx went thru. Approval letter to follow.

## 2012-11-19 NOTE — Progress Notes (Signed)
Subjective:    Patient ID: Ashley [name], female    DOB: May 24, 1945, 67 y.o.   MRN: [id number]  Synopsis: 67 y/o female with chronic cough, intermittent wheezing, and multiple hospitalizations for pneumonia presented to our clinic initially on 02/20/11 for evaluation of the same. She has seen an allergist for years for allergic rhinitis and had been told that she did not have asthma. We ordered an echocardiogram which showed significanly decreased LV function. She underwent a cardiac myoview which showed no evidence of ischaemia. She had a negative metacholine challenge test in 07/2009.  She was admitted in 04/2011 for volume overload.  A CT scan during that time showed volume overload and a pulmonary nodule.  There were [facility] CT chest reads suggestive of bronchiectasis but I (McQuaid) have never been convinced neither have [facility] and [employer].  She was seen by Dr. Thana Ates in 09/2011 for recurrent cough.  This was felt to be due to GERD and a Nissen Fundoplication has been considered.     HPI   04/03/11 ROV --Ashley [name] has had a busy 6 weeks since our last visit as detailed above. She has multiple complaints today, but her primary respiratory complaints are wheezing, shortness of breath, and continuous cough productive of clear sputum. She says that the symptoms are worse at night when she lies flat, so she has to prop her head up. She is a little more swollen than usual. She says that the tramadol makes her stomach hurt. She doesn't think that the pepcid helps. Her allergist put her on Asthmanex but she doesn't think that it has helped. She was put on a prednisone taper last week and she says that this has not helped her wheeze or shortness of breath at all. She wheezes nearly continuously. No fevers or chills.  She notes that she frequently wakes up many times during the night choking and gasping for air. Her husband says that she stops breathing at times.  She also notes waking up  choking frequently in the middle of the night.  Her sinuses have been well controlled recently.   04/26/11 ROV -- Ashley [name] returns for further evaluation of shortness of breath and cough. She states that she has had chills at home in the last week, associated with sorethroat, cough, wheezing (especially at night), clear sputum production, and chest pain with cough. She says that she has woken up multiple times at night with wheezing which is relieved with albuterol.  She stopped taking the bystolic and feels better now (less short of breath). She has also been taking more furosemide per Dr. Windell Hummingbird instructions.   05/24/11 ROV/Hosp f/u-- She reports that she is doing fairly well after her recent hospitalization for volume overload and cough. Her cough has improved quite a bit since then and she is not as short of breath since starting on oxygen. She only has scant clear sputum production. She notes that she does not urinate much on her current dose of lasix. She is taking the lasix and metolazone as written. She stopped taking the alvesco and nebulized meds.  07/31/2011 ROV-- She is doing fairly well. She has some green sputum and sinus congestion on occasion, worse in the last three weeks.  No fevers or chills or change in baseline dyspnea.  For the most part she feels her breathing has improved. She has had some eratic blood pressures at home last week.   She is adjusting her lasix for a goal weight of  150 lbs.   10/26/2011 ROV -- Ashley [name] is doing pretty well.  Her energy has been up and down but overall she is better.  She is going to pulm rehab Mon and Friday.  She apparently recently stopped two of her blood pressure meds. She does not complain of dyspnea but she notes some fatigue and leg cramps with a lot of stiffness.  She still has occasional coughing paroxysms. She thinks that her cough is better while using the continuous O2.  She notes nose bleeding from the oxygen use.    03/2012 ROV --  Ashley [name] returns to clinic today. She's had a rough go of it the last several months with an episode of diverticulitis. Fortunately cough has not been a significant problem in the last several months. She states that it is at baseline and continues to bother her on a daily basis but it has been the least of her worries. She has had some issues with both acute renal failure and volume overload which are now improving after Dr. Mariah Milling and Sharen Hones adjusted her diuretics. She is not short of breath and her sinus disease is fairly well controlled.  06/11/2012 ROV -- Since November Ashley [name] has had near continuous symptoms of sinus congestion and cough. She traveled to [place] in November and had sore throat, an exacerbation of her CHF and apparently some renal failure. She saw Dr. Mariah Milling and he adjusted her meds and the volume overload and renal failure improved. On January 4 she wants to just do for your use and again developed sinus congestion and cough. She was treated with Levaquin and Z-Pak at this point. She returned and saw Dr. Sharen Hones in mid January and was treated again with a Z-Pak. She tells me today that she still has ongoing sinus congestion, green sputum, green mucus production, and a very sore throat. She thinks that this is coming from her "voice box". She does not feel that she has chest congestion. She does not have fevers. Her shortness of breath is perhaps slightly increased. She has been sneezing a lot lately.  07/16/2012 ROV -- Since our last visit Ashley [name] continues to have continuous green sinus drainage.  This causes her to feel the need to clear her throat all day.  She feels pressure, eye, and tooth pain on the left side of her face.  She has not been back to see ENT.  She took the doxycycline we recommended and it did not help.  She continues to take flonase, saline rinses, and mucinex.    11/19/2012 ROV >> Ashley [name] says that her cough and wheezing have been minimal for the last  3 months. She had sinus surgery in March and this went very well. Since then she has not had any more symptoms of tooth pain or headache. She says that her breathing has been doing well. She had one episode of wheezing a few weeks ago but she states that she knew that that came from her vocal cords.  She's been suffering from a lot of body jerks lately. This is been going on along with some increased depression. Lately, her mother has not been doing well and is moving on to a stage of comfort measures only in her nursing facility. Ashley [name] feels that her mother is not going to be with as much longer. She tells me that most of her symptoms are worse after she goes and visits her mother.    Past Medical History  Diagnosis Date  .  Migraine without aura, without mention of intractable migraine without mention of status migrainosus   . History of diverticulitis of colon   . Pure hypercholesterolemia   . Irritable bowel syndrome   . Laryngeal nodule     Dr. Thelma Comp  . Fibrocystic breast disease   . Osteoarthritis   . Hemorrhoids   . Anxiety   . GERD (gastroesophageal reflux disease)   . Systolic CHF     EF 25-35%, LV dilation, significant MR, goal weight 150lbs  . Nonischemic cardiomyopathy   . History of pneumonia 2012  . Mitral regurgitation   . Anxiety   . Bronchiectasis     possibly mild, treat URIs aggressively  . Hypertension   . Heart murmur   . Angina   . Lung disease 07/14/11  . Sleep apnea   . Shortness of breath     "resting"  . Anemia   . Fibromyalgia   . Depression   . Disorder of vocal cords   . Chronic sinusitis   . Insomnia       Review of Systems  Constitutional: Negative for activity change, appetite change and fatigue.  HENT: Positive for congestion, postnasal drip and sinus pressure. Negative for nosebleeds.   Respiratory: Negative for choking and chest tightness.   Cardiovascular: Negative for palpitations and leg swelling.       Objective:   Physical  Exam  Filed Vitals:   11/19/12 1153  BP: 120/78  Pulse: 74  Height: 5\' 5"  (1.651 m)  Weight: 157 lb (71.215 kg)  SpO2: 97%  RA  Gen:  no acute distress HEENT: NCAT, EOMi, OP clear,  PULM: CTA B CV: RRR, no mgr, no JVD Ext: warm, no edema, no clubbing, no cyanosis  12/14 /12 CT Thorax: mediastinal lymphadenopathy, question right hilar mass, mild bronchiectasis in bases, pleural effusion with increased interlobular thickening  06/12/11 CT thorax: IMPRESSION:  Interval clearing of congestive heart failure and associated  reactive mediastinal and bihilar adenopathy. No evidence of  interstitial lung disease.     Assessment & Plan:   Cough This has been a stable interval for Ashley [name]. She is doing much better with cough after her sinus surgery. Her cough is really minimal as is for wheezing, so for now I see no reason to move forward with a further workup.  She needs to continue to try to manage gastroesophageal reflux disease with lifestyle modification.  Followup in 6 months.  Chronic systolic CHF (congestive heart failure) Her dry weight is now 155 pounds. She is doing well with her torsemide management and other meds managed by Dr. Mariah Milling.    Updated Medication List Outpatient Encounter Prescriptions as of 11/19/2012  Medication Sig Dispense Refill  . ALPRAZolam (XANAX) 0.25 MG tablet TAKE 1 TABLET TWICE A DAY  60 tablet  0  . aspirin 81 MG EC tablet Take 81 mg by mouth at bedtime.       Marland Kitchen CALCIUM-VITAMIN D PO Take 1 capsule by mouth daily.      . carvedilol (COREG) 12.5 MG tablet Take 0.5 tablets (6.25 mg total) by mouth 2 (two) times daily with a meal. 2 pills once a day      . Coenzyme Q10 (COQ-10) 150 MG CAPS Take 1 tablet by mouth daily.      Marland Kitchen dextromethorphan (DELSYM) 30 MG/5ML liquid Take 60 mg by mouth as needed. For cough/congestion.      . metolazone (ZAROXOLYN) 2.5 MG tablet Take 2.5 mg by mouth as needed.      Marland Kitchen  midodrine (PROAMATINE) 2.5 MG tablet Take 1  tablet (2.5 mg total) by mouth 3 (three) times daily as needed.  90 tablet  6  . omeprazole (PRILOSEC) 40 MG capsule Take 40 mg by mouth 2 (two) times daily.      Marland Kitchen PARoxetine (PAXIL) 10 MG tablet Take 1 tablet (10 mg total) by mouth every morning.  30 tablet  3  . potassium chloride (K-DUR) 10 MEQ tablet Take 1-4 tablets (10-40 mEq total) by mouth daily as needed. for every 10mg  toresmide  120 tablet  11  . pravastatin (PRAVACHOL) 80 MG tablet Take 1 tablet (80 mg total) by mouth every 3 (three) days.      Marland Kitchen spironolactone (ALDACTONE) 25 MG tablet Take 1 tablet by mouth daily.      Marland Kitchen torsemide (DEMADEX) 10 MG tablet 1-4 tablets daily depending on weight (goal 154-155lbs)      . vitamin C (ASCORBIC ACID) 500 MG tablet Take 500 mg by mouth 2 (two) times daily.       Marland Kitchen zolpidem (AMBIEN) 5 MG tablet TAKE 1/2 TO 1 TABLET AT BEDTIME AS NEEDED  30 tablet  0   No facility-administered encounter medications on file as of 11/19/2012.

## 2012-11-19 NOTE — Patient Instructions (Signed)
Keep taking your medicines as you are doing We will see you back in 6 months or sooner if needed 

## 2012-11-20 DIAGNOSIS — M999 Biomechanical lesion, unspecified: Secondary | ICD-10-CM | POA: Diagnosis not present

## 2012-11-20 DIAGNOSIS — M5137 Other intervertebral disc degeneration, lumbosacral region: Secondary | ICD-10-CM | POA: Diagnosis not present

## 2012-11-20 DIAGNOSIS — IMO0002 Reserved for concepts with insufficient information to code with codable children: Secondary | ICD-10-CM | POA: Diagnosis not present

## 2012-11-21 DIAGNOSIS — L570 Actinic keratosis: Secondary | ICD-10-CM | POA: Diagnosis not present

## 2012-11-21 DIAGNOSIS — L821 Other seborrheic keratosis: Secondary | ICD-10-CM | POA: Diagnosis not present

## 2012-11-21 DIAGNOSIS — D1739 Benign lipomatous neoplasm of skin and subcutaneous tissue of other sites: Secondary | ICD-10-CM | POA: Diagnosis not present

## 2012-11-21 DIAGNOSIS — D485 Neoplasm of uncertain behavior of skin: Secondary | ICD-10-CM | POA: Diagnosis not present

## 2012-11-27 DIAGNOSIS — M5137 Other intervertebral disc degeneration, lumbosacral region: Secondary | ICD-10-CM | POA: Diagnosis not present

## 2012-11-27 DIAGNOSIS — IMO0002 Reserved for concepts with insufficient information to code with codable children: Secondary | ICD-10-CM | POA: Diagnosis not present

## 2012-11-27 DIAGNOSIS — M999 Biomechanical lesion, unspecified: Secondary | ICD-10-CM | POA: Diagnosis not present

## 2012-12-03 ENCOUNTER — Ambulatory Visit (INDEPENDENT_AMBULATORY_CARE_PROVIDER_SITE_OTHER)
Admission: RE | Admit: 2012-12-03 | Discharge: 2012-12-03 | Disposition: A | Discharge status: Home / Self Care | Payer: Medicare Other | Source: Ambulatory Visit | Attending: Family Medicine | Admitting: Family Medicine

## 2012-12-03 ENCOUNTER — Encounter: Payer: Self-pay | Admitting: Family Medicine

## 2012-12-03 ENCOUNTER — Ambulatory Visit (INDEPENDENT_AMBULATORY_CARE_PROVIDER_SITE_OTHER): Payer: Medicare Other | Admitting: Family Medicine

## 2012-12-03 VITALS — BP 122/78 | HR 72 | Temp 97.8°F | Wt 157.0 lb

## 2012-12-03 DIAGNOSIS — M79609 Pain in unspecified limb: Secondary | ICD-10-CM | POA: Diagnosis not present

## 2012-12-03 DIAGNOSIS — M79602 Pain in left arm: Secondary | ICD-10-CM

## 2012-12-03 LAB — CK: Total CK: 113 U/L (ref 7–177)

## 2012-12-03 MED ORDER — DICLOFENAC SODIUM 1 % TD GEL: 1.0000 "application " | Freq: Three times a day (TID) | TRANSDERMAL | Status: DC

## 2012-12-03 NOTE — Assessment & Plan Note (Signed)
Prior thought due to biceps tendonitis - pt thinks somehow related to pneumovax 02/2012. Some concern for muscle inflammation today on exam - will check CK. I also will obtain humerus xray given longstanding nature of pain to r/o bone etiology I have suggested trial of voltaren gel as well as continued ice/heat.   ?myositis vs tendonitis vs enthesis. Not consistent with DVT.

## 2012-12-03 NOTE — Patient Instructions (Signed)
Look into foods that can help with inflammation. Blood work today as well as xray of left arm today. Good to see you, call us with questions. We will give you a call with results of xray and blood work.

## 2012-12-03 NOTE — Progress Notes (Signed)
  Subjective:    Patient ID: Ashley [name], female    DOB: 06-May-1946, 67 y.o.   MRN: [id number]  HPI CC: L arm pain  Pain present for last 8 months - since received pneumovax.  Points to left lateral upper mid arm, some radiation of pain to [facility] fossa.  Describes sharp aches mixed with dull aches.    Several weeks ago did brush L upper arm against doorframe, may have caused bruise.  Not significant shoulder discomfort other than chronic issues (sees chiropractor for this). + neck pain on right as well as headaches. Denies inciting trauma/injury  For left upper arm - has tried exercising, ice/heat, tylenol, massage, chiropractor with no improvement.  Planning on weeaning herself off paxil - currently on 5mg  daily.  States also had body jerk on this med.  Past Medical History  Diagnosis Date  . Migraine without aura, without mention of intractable migraine without mention of status migrainosus   . History of diverticulitis of colon   . Pure hypercholesterolemia   . Irritable bowel syndrome   . Laryngeal nodule     Dr. Thelma Comp  . Fibrocystic breast disease   . Osteoarthritis   . Hemorrhoids   . Anxiety   . GERD (gastroesophageal reflux disease)   . Systolic CHF     EF 25-35%, LV dilation, significant MR, goal weight 150lbs  . Nonischemic cardiomyopathy   . History of pneumonia 2012  . Mitral regurgitation   . Anxiety   . Bronchiectasis     possibly mild, treat URIs aggressively  . Hypertension   . Heart murmur   . Angina   . Lung disease 07/14/11  . Sleep apnea   . Shortness of breath     "resting"  . Anemia   . Fibromyalgia   . Depression   . Disorder of vocal cords   . Chronic sinusitis   . Insomnia     Review of Systems Per HPI    Objective:   Physical Exam  Nursing note and vitals reviewed. Constitutional: She appears well-developed and well-nourished. No distress.  Neck: Normal range of motion. Neck supple.  Musculoskeletal: She exhibits no edema.  FROM  at neck. FROM at shoulder. FROM at bilateral elbows. Mild tenderness to palpation throughout entire right and left arms, predominantly in medial area superior to L AC fossa, some swelling appreciated as well. Brachiocephalic pulses equal bilaterally. Small longstanding rubbery mass L lateral upper arm, unchanged.       Assessment & Plan:

## 2012-12-04 DIAGNOSIS — M999 Biomechanical lesion, unspecified: Secondary | ICD-10-CM | POA: Diagnosis not present

## 2012-12-04 DIAGNOSIS — M5137 Other intervertebral disc degeneration, lumbosacral region: Secondary | ICD-10-CM | POA: Diagnosis not present

## 2012-12-04 DIAGNOSIS — IMO0002 Reserved for concepts with insufficient information to code with codable children: Secondary | ICD-10-CM | POA: Diagnosis not present

## 2012-12-10 DIAGNOSIS — M999 Biomechanical lesion, unspecified: Secondary | ICD-10-CM | POA: Diagnosis not present

## 2012-12-10 DIAGNOSIS — IMO0002 Reserved for concepts with insufficient information to code with codable children: Secondary | ICD-10-CM | POA: Diagnosis not present

## 2012-12-10 DIAGNOSIS — M5137 Other intervertebral disc degeneration, lumbosacral region: Secondary | ICD-10-CM | POA: Diagnosis not present

## 2012-12-12 DIAGNOSIS — D1739 Benign lipomatous neoplasm of skin and subcutaneous tissue of other sites: Secondary | ICD-10-CM | POA: Diagnosis not present

## 2012-12-12 DIAGNOSIS — L821 Other seborrheic keratosis: Secondary | ICD-10-CM | POA: Diagnosis not present

## 2012-12-12 DIAGNOSIS — D0460 Carcinoma in situ of skin of unspecified upper limb, including shoulder: Secondary | ICD-10-CM | POA: Diagnosis not present

## 2012-12-12 DIAGNOSIS — L570 Actinic keratosis: Secondary | ICD-10-CM | POA: Diagnosis not present

## 2012-12-25 DIAGNOSIS — M999 Biomechanical lesion, unspecified: Secondary | ICD-10-CM | POA: Diagnosis not present

## 2012-12-25 DIAGNOSIS — M5137 Other intervertebral disc degeneration, lumbosacral region: Secondary | ICD-10-CM | POA: Diagnosis not present

## 2012-12-25 DIAGNOSIS — IMO0002 Reserved for concepts with insufficient information to code with codable children: Secondary | ICD-10-CM | POA: Diagnosis not present

## 2012-12-27 ENCOUNTER — Telehealth: Payer: Self-pay | Admitting: *Deleted

## 2012-12-27 NOTE — Telephone Encounter (Signed)
Gave instructions to pt on how to transmit manually.  She had a successful, quick transmission.  No irregular episodes have occurred.    Pt concerned her b/p and heart rate are irregular.  I assured her that her ICD is functioning great based on the transmission data.  She pressed further as to why she feels terrible. I posited that due to the recent loss of her mother, I said I believe this situation will best be mended with time vs. changing any programming parameters.   Next scheduled home remote will be 01/27/13 Ashley [name]

## 2012-12-27 NOTE — Telephone Encounter (Signed)
Patient called her pulse is at 130, please advise

## 2012-12-27 NOTE — Telephone Encounter (Signed)
Spoke w/ pt.  Mother passed away last Tues.  Since Wed, her BP has been up, but was 110/66 this AM.  Pulse has been up as well, at 130 bpm this am.  She feels a bit dizzy.  Her wt was up to 163 the other night, but she is back at 158 this am.  Advised her to send transmission thru [employer], but pt states that she was never able to successfully send a transmission, so she was given a new machine, but she has never plugged it in to see if it works.  Charles in EP at [facility] office will call pt to walk her through process of setting up machine and transmitting results and will call us if needed.

## 2012-12-27 NOTE — Telephone Encounter (Signed)
Spoke with pt, Ashley [name] from the device clinic advised pt that transmission was fine. Her heart rate is down and she is feeling a lot better.

## 2012-12-31 DIAGNOSIS — J301 Allergic rhinitis due to pollen: Secondary | ICD-10-CM | POA: Diagnosis not present

## 2012-12-31 DIAGNOSIS — L0201 Cutaneous abscess of face: Secondary | ICD-10-CM | POA: Diagnosis not present

## 2012-12-31 DIAGNOSIS — J3489 Other specified disorders of nose and nasal sinuses: Secondary | ICD-10-CM | POA: Diagnosis not present

## 2013-01-03 ENCOUNTER — Other Ambulatory Visit: Payer: Self-pay | Admitting: Cardiovascular Disease

## 2013-01-06 DIAGNOSIS — IMO0002 Reserved for concepts with insufficient information to code with codable children: Secondary | ICD-10-CM | POA: Diagnosis not present

## 2013-01-06 DIAGNOSIS — M999 Biomechanical lesion, unspecified: Secondary | ICD-10-CM | POA: Diagnosis not present

## 2013-01-06 DIAGNOSIS — M5137 Other intervertebral disc degeneration, lumbosacral region: Secondary | ICD-10-CM | POA: Diagnosis not present

## 2013-01-07 ENCOUNTER — Telehealth: Payer: Self-pay

## 2013-01-07 ENCOUNTER — Telehealth: Payer: Self-pay | Admitting: Internal Medicine
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
## 2013-01-08 NOTE — Telephone Encounter (Signed)
Dr. Lubertha Basque office spoke w/ pt to have her send transmission.  Pt to call with any questions or concerns.

## 2013-01-09 ENCOUNTER — Other Ambulatory Visit: Payer: Self-pay | Admitting: Family Medicine

## 2013-01-09 NOTE — Telephone Encounter (Signed)
OK to refill

## 2013-01-10 ENCOUNTER — Ambulatory Visit (INDEPENDENT_AMBULATORY_CARE_PROVIDER_SITE_OTHER): Payer: Medicare Other | Admitting: Family Medicine

## 2013-01-10 ENCOUNTER — Other Ambulatory Visit: Payer: Self-pay | Admitting: Cardiovascular Disease

## 2013-01-10 ENCOUNTER — Encounter: Payer: Self-pay | Admitting: Family Medicine

## 2013-01-10 VITALS — BP 100/70 | HR 92 | Temp 98.1°F | Ht 65.0 in | Wt 157.0 lb

## 2013-01-10 DIAGNOSIS — S2341XA Sprain of ribs, initial encounter: Secondary | ICD-10-CM | POA: Diagnosis not present

## 2013-01-10 DIAGNOSIS — IMO0002 Reserved for concepts with insufficient information to code with codable children: Secondary | ICD-10-CM | POA: Insufficient documentation

## 2013-01-10 DIAGNOSIS — S29019A Strain of muscle and tendon of unspecified wall of thorax, initial encounter: Secondary | ICD-10-CM

## 2013-01-10 MED ORDER — METAXALONE 800 MG PO TABS: 400.0000 mg | ORAL_TABLET | Freq: Three times a day (TID) | ORAL | Status: DC | PRN
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
## 2013-01-10 NOTE — Telephone Encounter (Signed)
plz phone in. 

## 2013-01-10 NOTE — Telephone Encounter (Signed)
Refill called to [facility]. 

## 2013-01-10 NOTE — Assessment & Plan Note (Signed)
Anticipate ribcage contusion vs strain.  Treat with skelaxin samples and voltaren gel. Pt will call me with how skelaxin works for possible prescription. No evidence of flail chest today.

## 2013-01-10 NOTE — Patient Instructions (Signed)
I think you do have bony bruise.  Treat with voltaren gel and skelaxin - sample provided today .  Let me know if helping for prescription. Use pillow when you're coughing. Let me know how you are doing.

## 2013-01-13 DIAGNOSIS — M810 Age-related osteoporosis without current pathological fracture: Secondary | ICD-10-CM

## 2013-01-13 HISTORY — DX: Age-related osteoporosis without current pathological fracture: M81.0

## 2013-01-14 ENCOUNTER — Telehealth: Payer: Self-pay | Admitting: *Deleted

## 2013-01-14 NOTE — Telephone Encounter (Signed)
Returned your call.

## 2013-01-14 NOTE — Telephone Encounter (Signed)
Spoke w/ pt.  She wanted to update me on her visit with her PCP.  See Dr. Tessie Eke note.

## 2013-01-15 DIAGNOSIS — M5137 Other intervertebral disc degeneration, lumbosacral region: Secondary | ICD-10-CM | POA: Diagnosis not present

## 2013-01-15 DIAGNOSIS — IMO0002 Reserved for concepts with insufficient information to code with codable children: Secondary | ICD-10-CM | POA: Diagnosis not present

## 2013-01-15 DIAGNOSIS — M999 Biomechanical lesion, unspecified: Secondary | ICD-10-CM | POA: Diagnosis not present

## 2013-01-22 DIAGNOSIS — Z01419 Encounter for gynecological examination (general) (routine) without abnormal findings: Secondary | ICD-10-CM | POA: Diagnosis not present

## 2013-01-22 DIAGNOSIS — M999 Biomechanical lesion, unspecified: Secondary | ICD-10-CM | POA: Diagnosis not present

## 2013-01-22 DIAGNOSIS — IMO0002 Reserved for concepts with insufficient information to code with codable children: Secondary | ICD-10-CM | POA: Diagnosis not present

## 2013-01-22 DIAGNOSIS — M5137 Other intervertebral disc degeneration, lumbosacral region: Secondary | ICD-10-CM | POA: Diagnosis not present

## 2013-01-22 DIAGNOSIS — Z124 Encounter for screening for malignant neoplasm of cervix: Secondary | ICD-10-CM | POA: Diagnosis not present

## 2013-01-27 ENCOUNTER — Ambulatory Visit (INDEPENDENT_AMBULATORY_CARE_PROVIDER_SITE_OTHER): Payer: Medicare Other | Admitting: *Deleted

## 2013-01-27 DIAGNOSIS — I509 Heart failure, unspecified: Secondary | ICD-10-CM | POA: Diagnosis not present

## 2013-01-27 DIAGNOSIS — I5022 Chronic systolic (congestive) heart failure: Secondary | ICD-10-CM

## 2013-01-27 DIAGNOSIS — Z95810 Presence of automatic (implantable) cardiac defibrillator: Secondary | ICD-10-CM

## 2013-01-27 DIAGNOSIS — I420 Dilated cardiomyopathy: Secondary | ICD-10-CM

## 2013-01-27 DIAGNOSIS — I428 Other cardiomyopathies: Secondary | ICD-10-CM | POA: Diagnosis not present

## 2013-01-28 ENCOUNTER — Encounter: Payer: Self-pay | Admitting: Family Medicine

## 2013-01-28 DIAGNOSIS — M899 Disorder of bone, unspecified: Secondary | ICD-10-CM | POA: Diagnosis not present

## 2013-01-28 DIAGNOSIS — Z1231 Encounter for screening mammogram for malignant neoplasm of breast: Secondary | ICD-10-CM | POA: Diagnosis not present

## 2013-01-28 LAB — HM MAMMOGRAPHY: HM Mammogram: NORMAL

## 2013-01-29 ENCOUNTER — Encounter: Payer: Self-pay | Admitting: Family Medicine

## 2013-01-29 ENCOUNTER — Encounter: Payer: Self-pay | Admitting: *Deleted

## 2013-01-29 DIAGNOSIS — J069 Acute upper respiratory infection, unspecified: Secondary | ICD-10-CM | POA: Diagnosis not present

## 2013-01-29 DIAGNOSIS — M26609 Unspecified temporomandibular joint disorder, unspecified side: Secondary | ICD-10-CM | POA: Diagnosis not present

## 2013-01-29 DIAGNOSIS — H9209 Otalgia, unspecified ear: Secondary | ICD-10-CM | POA: Diagnosis not present

## 2013-01-29 LAB — REMOTE ICD DEVICE
BATTERY VOLTAGE: 3.1432 V
BRDY-0002RV: 40 {beats}/min
CHARGE TIME: 8.908 s
DEV-0020ICD: NEGATIVE
FVT: 0
HV IMPEDENCE: 83 Ohm
PACEART VT: 0
RV LEAD AMPLITUDE: 7.8 mv
RV LEAD IMPEDENCE ICD: 627 Ohm
RV LEAD THRESHOLD: 0.625 V
TOT-0001: 0
TOT-0002: 0
TOT-0006: 20130301000000
TZAT-0001FASTVT: 1
TZAT-0001SLOWVT: 1
TZAT-0002FASTVT: NEGATIVE
TZAT-0002SLOWVT: NEGATIVE
TZAT-0012FASTVT: 170 ms
TZAT-0012SLOWVT: 170 ms
TZAT-0018FASTVT: NEGATIVE
TZAT-0018SLOWVT: NEGATIVE
TZAT-0019FASTVT: 8 V
TZAT-0019SLOWVT: 8 V
TZAT-0020FASTVT: 1.5 ms
TZAT-0020SLOWVT: 1.5 ms
TZON-0003SLOWVT: 360 ms
TZON-0003VSLOWVT: 450 ms
TZON-0004SLOWVT: 16
TZON-0004VSLOWVT: 20
TZON-0005SLOWVT: 12
TZST-0001FASTVT: 2
TZST-0001FASTVT: 3
TZST-0001FASTVT: 4
TZST-0001FASTVT: 5
TZST-0001FASTVT: 6
TZST-0001SLOWVT: 2
TZST-0001SLOWVT: 3
TZST-0001SLOWVT: 4
TZST-0001SLOWVT: 5
TZST-0001SLOWVT: 6
TZST-0002FASTVT: NEGATIVE
TZST-0002FASTVT: NEGATIVE
TZST-0002FASTVT: NEGATIVE
TZST-0002FASTVT: NEGATIVE
TZST-0002FASTVT: NEGATIVE
TZST-0002SLOWVT: NEGATIVE
TZST-0002SLOWVT: NEGATIVE
TZST-0002SLOWVT: NEGATIVE
TZST-0002SLOWVT: NEGATIVE
TZST-0002SLOWVT: NEGATIVE
VENTRICULAR PACING ICD: 0 pct
VF: 0

## 2013-01-31 ENCOUNTER — Encounter: Payer: Self-pay | Admitting: Cardiovascular Disease

## 2013-01-31 ENCOUNTER — Ambulatory Visit (INDEPENDENT_AMBULATORY_CARE_PROVIDER_SITE_OTHER): Payer: Medicare Other | Admitting: Cardiovascular Disease

## 2013-01-31 VITALS — BP 104/80 | HR 79 | Ht 65.5 in | Wt 160.2 lb

## 2013-01-31 DIAGNOSIS — I951 Orthostatic hypotension: Secondary | ICD-10-CM

## 2013-01-31 DIAGNOSIS — R0602 Shortness of breath: Secondary | ICD-10-CM

## 2013-01-31 DIAGNOSIS — R0789 Other chest pain: Secondary | ICD-10-CM | POA: Diagnosis not present

## 2013-01-31 DIAGNOSIS — I428 Other cardiomyopathies: Secondary | ICD-10-CM

## 2013-01-31 DIAGNOSIS — F32A Depression, unspecified: Secondary | ICD-10-CM

## 2013-01-31 DIAGNOSIS — I420 Dilated cardiomyopathy: Secondary | ICD-10-CM

## 2013-01-31 DIAGNOSIS — I5022 Chronic systolic (congestive) heart failure: Secondary | ICD-10-CM

## 2013-01-31 DIAGNOSIS — F329 Major depressive disorder, single episode, unspecified: Secondary | ICD-10-CM

## 2013-01-31 DIAGNOSIS — R000 Tachycardia, unspecified: Secondary | ICD-10-CM

## 2013-01-31 DIAGNOSIS — I509 Heart failure, unspecified: Secondary | ICD-10-CM

## 2013-01-31 MED ORDER — MIDODRINE HCL 2.5 MG PO TABS: 2.5000 mg | ORAL_TABLET | Freq: Three times a day (TID) | ORAL | Status: DC | PRN

## 2013-01-31 MED ORDER — CARVEDILOL 12.5 MG PO TABS: 12.5000 mg | ORAL_TABLET | Freq: Two times a day (BID) | ORAL | Status: DC

## 2013-01-31 NOTE — Progress Notes (Signed)
Patient ID: Ashley [name], female    DOB: 1946-04-23, 67 y.o.   MRN: [id number]  HPI Comments: 67 y/o female with history of pneumonia, chronic cough felt secondary to GERD, abnormal PFTs in the past, shortness of breath dating back to September 2012 that presented acutely, CT scan showing progressive pleural effusion , echo showing Cardiomyopathy  ejection fraction estimated at 25-35%,  Previous  Lexiscan Myoview  showed no significant ischemia, no EKG changes concerning for ischemia.   Follow up admission to [facility] in December 2012 for bronchitis and hypoxia as well as systolic CHF. She had antibiotics, prednisone and diuresis and has felt better since discharge. ACE inhibitor placed on hold secondary to chronic cough.  S/P ICD 10 days ago for EF <35%.  Goal weight had been in the low 150 range. Previous Cardiac MRI showed ejection fraction 34% ICD placed  by Dr. Ladona Ridgel.  On previous office visits, she reported having low blood pressure with malaise and fatigue. She reports that her blood pressure has been well controlled recently. Her biggest complaint is tachycardia. She reports that heart rate is elevated when she wakes up, to 126 beats per minute in the morning, 116 beats per minute in the afternoon. Her weight is up 10 pounds from prior clinic visits. She denies any edema. She has been consistent with her diuretic regimen.   She is concerned about her stress fracture in her back, abnormal bone density test. She feels tired all the time. Also worried about a pain in her right side. She was leaning in her car and heard a popping noise. His continues to hurt at times with movement.  Problems in the past on Lipitor with cramping. Started on pravastatin   EKG shows normal sinus rhythm with rate 79 beats per minute with nonspecific ST abnormality      Outpatient Encounter Prescriptions as of 01/31/2013  Medication Sig Dispense Refill  . ALPRAZolam (XANAX) 0.25 MG tablet  TAKE 1 TABLET TWICE A DAY  60 tablet  0  . aspirin 81 MG EC tablet Take 81 mg by mouth at bedtime.       Marland Kitchen azithromycin (ZITHROMAX) 250 MG tablet Take 250 mg by mouth daily.       Marland Kitchen CALCIUM-VITAMIN D PO Take 1 capsule by mouth daily.      . carvedilol (COREG) 12.5 MG tablet Take 1/2 tablet  by mouth 2 (two) times daily with a meal.  180 tablet  2  . Coenzyme Q10 (COQ-10) 150 MG CAPS Take 1 tablet by mouth daily.      Marland Kitchen dextromethorphan (DELSYM) 30 MG/5ML liquid Take 60 mg by mouth as needed. For cough/congestion.      . diclofenac sodium (VOLTAREN) 1 % GEL Apply 1 application topically 3 (three) times daily.  1 Tube  1  . metolazone (ZAROXOLYN) 2.5 MG tablet Take 2.5 mg by mouth as needed.      . midodrine (PROAMATINE) 2.5 MG tablet Take 1 tablet (2.5 mg total) by mouth 3 (three) times daily as needed.  90 tablet  6  . omeprazole (PRILOSEC) 40 MG capsule Take 40 mg by mouth 2 (two) times daily.      . potassium chloride (K-DUR) 10 MEQ tablet Take 1-4 tablets (10-40 mEq total) by mouth daily as needed. for every 10mg  toresmide  120 tablet  11  . pravastatin (PRAVACHOL) 80 MG tablet Take 1 tablet (80 mg total) by mouth every 3 (three) days.      Marland Kitchen  predniSONE (STERAPRED UNI-PAK) 5 MG TABS tablet 5 mg daily.       Marland Kitchen spironolactone (ALDACTONE) 25 MG tablet TAKE 1 TABLET (25 MG TOTAL) BY MOUTH DAILY.  90 tablet  3  . torsemide (DEMADEX) 10 MG tablet 1-4 tablets daily depending on weight (goal 154-155lbs)      . vitamin C (ASCORBIC ACID) 500 MG tablet Take 500 mg by mouth 2 (two) times daily.       Marland Kitchen zolpidem (AMBIEN) 5 MG tablet TAKE 1/2 TO 1 TABLET BY MOUTH AT BEDTIME AS NEEDED  30 tablet  0   Review of Systems  Constitutional: Positive for fatigue.  HENT: Positive for sinus pressure.   Eyes: Negative.   Respiratory: Negative.   Gastrointestinal: Negative.   Musculoskeletal: Negative.   Skin: Negative.   Neurological: Negative.   Psychiatric/Behavioral: Negative.   All other systems  reviewed and are negative.    BP 104/80  Pulse 79  Ht 5' 5.5" (1.664 m)  Wt 160 lb 4 oz (72.689 kg)  BMI 26.25 kg/m2  Physical Exam  Nursing note and vitals reviewed. Constitutional: She is oriented to person, place, and time. She appears well-developed and well-nourished.  HENT:  Head: Normocephalic.  Nose: Nose normal.  Mouth/Throat: Oropharynx is clear and moist.  Eyes: Conjunctivae are normal. Pupils are equal, round, and reactive to light.  Neck: Normal range of motion. Neck supple. No JVD present.  Cardiovascular: Normal rate, regular rhythm, S1 normal, S2 normal, normal heart sounds and intact distal pulses.  Exam reveals no gallop and no friction rub.   No murmur heard. Pulmonary/Chest: Effort normal and breath sounds normal. No respiratory distress. She has no wheezes. She has no rales. She exhibits no tenderness.  Abdominal: Soft. Bowel sounds are normal. She exhibits no distension. There is no tenderness.  Musculoskeletal: Normal range of motion. She exhibits no edema and no tenderness.  Lymphadenopathy:    She has no cervical adenopathy.  Neurological: She is alert and oriented to person, place, and time. Coordination normal.  Skin: Skin is warm and dry. No rash noted. No erythema.  Psychiatric: She has a normal mood and affect. Her behavior is normal. Judgment and thought content normal.    Assessment and Plan

## 2013-01-31 NOTE — Assessment & Plan Note (Signed)
Periodically has hypotension. Improved from prior visits. Possibly secondary to recent weight gain. Was given midodrine previously. She does not use this very much

## 2013-01-31 NOTE — Patient Instructions (Addendum)
Please increase the coreg up to 12.5 mg twice a day Take midodrine for low blood pressures, as needed  We will schedule you for an echocardiogram for history of cardiomyopathy, tachycardia  Please call us if you have new issues that need to be addressed before your next appt.  Your physician wants you to follow-up in: 6 months.  You will receive a reminder letter in the mail two months in advance. If you don't receive a letter, please call our office to schedule the follow-up appointment.

## 2013-01-31 NOTE — Assessment & Plan Note (Signed)
Encouraged her to watch her weight, the aggressive with her diet, take extra torsemide for lower extremity edema and recent weight gain

## 2013-01-31 NOTE — Assessment & Plan Note (Signed)
Numerous  symptoms on today's visit. overall does not seem to be happy.

## 2013-01-31 NOTE — Assessment & Plan Note (Addendum)
Given her 10 pound weight gain, malaise, tachycardia, we have ordered an echocardiogram to reevaluate her ejection fraction. Previous EF 30-35%. She reports having tachycardia. Heart rate is normal on today's visit. We have suggested she increase her Coreg back to 12.5 mg twice a day as tolerated. Dose was decreased by Dr. Ladona Ridgel for borderline low blood pressures. Blood pressure less of an issue at this time given recent weight gain

## 2013-02-04 ENCOUNTER — Ambulatory Visit (INDEPENDENT_AMBULATORY_CARE_PROVIDER_SITE_OTHER): Payer: Medicare Other | Admitting: Internal Medicine

## 2013-02-04 ENCOUNTER — Encounter: Payer: Self-pay | Admitting: Internal Medicine

## 2013-02-04 VITALS — BP 122/72 | HR 77 | Temp 97.8°F | Wt 159.5 lb

## 2013-02-04 DIAGNOSIS — M2669 Other specified disorders of temporomandibular joint: Secondary | ICD-10-CM | POA: Diagnosis not present

## 2013-02-04 DIAGNOSIS — M26629 Arthralgia of temporomandibular joint, unspecified side: Secondary | ICD-10-CM

## 2013-02-04 MED ORDER — CYCLOBENZAPRINE HCL 5 MG PO TABS: 5.0000 mg | ORAL_TABLET | Freq: Every evening | ORAL | Status: DC | PRN

## 2013-02-04 NOTE — Patient Instructions (Signed)
Temporomandibular Joint Pain Your exam shows that you have a problem with your temporomandibular joint (TMJ), the joint that moves when you open your mouth or chew food. TMJ problems can result from direct injuries, bite abnormalities, or tension states which cause you to grind or clench your teeth. Typical symptoms include pain around the joint, clicking, restricted movement, and headaches. The TMJ is like any other joint in the body; when it is strained, it needs rest to repair itself. To keep the joint at rest it is important that you do not open your mouth wider than the width of your index finger. If you must yawn, be sure to support your chin with your hand so your mouth does not open wide. Eat a soft diet (nothing firmer than ground beef, no raw vegetables), do not chew gum and do not talk if it causes you pain. Apply topical heat by using a warm, moist cloth placed in front of the ear for 15 to 20 minutes several times daily. Alternating heat and ice may give even more relief. Anti-inflammatory pain medicine and muscle relaxants can also be helpful. A dental orthotic or splint may be used for temporary relief. Long-term problems may require treatment for stress as well as braces or surgery. Please check with your doctor or dentist if your symptoms do not improve within one week. Document Released: 06/08/2004 Document Revised: 07/24/2011 Document Reviewed: 05/01/2005 [facility] Patient Information 2014 [place], [place]. Temporomandibular Joint Pain Your exam shows that you have a problem with your temporomandibular joint (TMJ), the joint that moves when you open your mouth or chew food. TMJ problems can result from direct injuries, bite abnormalities, or tension states which cause you to grind or clench your teeth. Typical symptoms include pain around the joint, clicking, restricted movement, and headaches. The TMJ is like any other joint in the body; when it is strained, it needs rest to repair itself.  To keep the joint at rest it is important that you do not open your mouth wider than the width of your index finger. If you must yawn, be sure to support your chin with your hand so your mouth does not open wide. Eat a soft diet (nothing firmer than ground beef, no raw vegetables), do not chew gum and do not talk if it causes you pain. Apply topical heat by using a warm, moist cloth placed in front of the ear for 15 to 20 minutes several times daily. Alternating heat and ice may give even more relief. Anti-inflammatory pain medicine and muscle relaxants can also be helpful. A dental orthotic or splint may be used for temporary relief. Long-term problems may require treatment for stress as well as braces or surgery. Please check with your doctor or dentist if your symptoms do not improve within one week. Document Released: 06/08/2004 Document Revised: 07/24/2011 Document Reviewed: 05/01/2005 [facility] Patient Information 2014 [place], [place].

## 2013-02-04 NOTE — Progress Notes (Signed)
Subjective:    Patient ID: Ashley [name], female    DOB: 08/02/1945, 67 y.o.   MRN: [id number]  HPI  Pt presents to the clinic today with c/o TMJ pain. This has been a chronic issue for her but has flared up within the last 10 days. She saw her ENT doctor last week for the same. He put her on a pred taper because she is unable to take NSAIDs secondary to her heart history and history of acute renal failure. She reports no improvement since starting the pred taper. She has been evaluated by Oral Surgery in the past and reports that she is not a candidate for TMJ surgery. She denies grinding her teeth, chewing ice or biting her nails. Her ENT doctor suggested she buy a mouth guard to wear at night but she has not done that yet.  Review of Systems      Past Medical History  Diagnosis Date  . Migraine without aura, without mention of intractable migraine without mention of status migrainosus   . History of diverticulitis of colon   . Pure hypercholesterolemia   . Irritable bowel syndrome   . Laryngeal nodule     Dr. Thelma Comp  . Fibrocystic breast disease   . Osteoarthritis   . Hemorrhoids   . Anxiety   . GERD (gastroesophageal reflux disease)   . Systolic CHF     EF 25-35%, LV dilation, significant MR, goal weight 150lbs  . Nonischemic cardiomyopathy   . History of pneumonia 2012  . Mitral regurgitation   . Anxiety   . Bronchiectasis     possibly mild, treat URIs aggressively  . Hypertension   . Heart murmur   . Angina   . Lung disease 07/14/11  . Sleep apnea   . Shortness of breath     "resting"  . Anemia   . Fibromyalgia   . Depression   . Disorder of vocal cords   . Chronic sinusitis   . Insomnia     Current Outpatient Prescriptions  Medication Sig Dispense Refill  . ALPRAZolam (XANAX) 0.25 MG tablet TAKE 1 TABLET TWICE A DAY  60 tablet  0  . aspirin 81 MG EC tablet Take 81 mg by mouth at bedtime.       . Biotin 1000 MCG tablet Take 1,000 mcg by mouth daily.      Marland Kitchen  CALCIUM-VITAMIN D PO Take 1 capsule by mouth daily.      . carvedilol (COREG) 12.5 MG tablet Take 1 tablet (12.5 mg total) by mouth 2 (two) times daily with a meal.  180 tablet  2  . Coenzyme Q10 (COQ-10) 150 MG CAPS Take 1 tablet by mouth daily.      Marland Kitchen dextromethorphan (DELSYM) 30 MG/5ML liquid Take 60 mg by mouth as needed. For cough/congestion.      . diclofenac sodium (VOLTAREN) 1 % GEL Apply 1 application topically 3 (three) times daily.  1 Tube  1  . metolazone (ZAROXOLYN) 2.5 MG tablet Take 2.5 mg by mouth as needed.      . midodrine (PROAMATINE) 2.5 MG tablet Take 1 tablet (2.5 mg total) by mouth 3 (three) times daily as needed.  90 tablet  6  . omeprazole (PRILOSEC) 40 MG capsule Take 40 mg by mouth 2 (two) times daily.      . potassium chloride (K-DUR) 10 MEQ tablet Take 1-4 tablets (10-40 mEq total) by mouth daily as needed. for every 10mg  toresmide  120 tablet  11  . pravastatin (PRAVACHOL) 80 MG tablet Take 1 tablet (80 mg total) by mouth every 3 (three) days.      Marland Kitchen spironolactone (ALDACTONE) 25 MG tablet TAKE 1 TABLET (25 MG TOTAL) BY MOUTH DAILY.  90 tablet  3  . torsemide (DEMADEX) 10 MG tablet 1-4 tablets daily depending on weight (goal 154-155lbs)      . vitamin C (ASCORBIC ACID) 500 MG tablet Take 500 mg by mouth 2 (two) times daily.       Marland Kitchen zolpidem (AMBIEN) 5 MG tablet TAKE 1/2 TO 1 TABLET BY MOUTH AT BEDTIME AS NEEDED  30 tablet  0  . cyclobenzaprine (FLEXERIL) 5 MG tablet Take 1 tablet (5 mg total) by mouth at bedtime as needed for muscle spasms.  10 tablet  0  . [DISCONTINUED] bisoprolol (ZEBETA) 5 MG tablet Take 5 mg by mouth daily.       No current facility-administered medications for this visit.    Allergies  Allergen Reactions  . Antihistamines, Chlorpheniramine-Type Other (See Comments)    Makes her eyes look like she sees strobe lights  . Influenza Vac Split [Flu Virus Vaccine] Other (See Comments)    "allergic to concentrated eggs that are in vaccine"   . Penicillins     REACTION: "told 50 years ago I couldn't take it; might be immune to it", able to take amoxicillin  . Sulfasalazine Other (See Comments)    "makes my whole body smell like sulfa & makes me sick just smelling it"  . Atorvastatin Other (See Comments)    Severe muscle cramps to generic atorvastatin.  Able to take brand lipitor.  . Latex Nausea Only and Rash  . Rosuvastatin Other (See Comments)    Was not effective controlling lipids  . Cymbalta [Duloxetine Hcl] Other (See Comments)    "body spasms and made me feel weird in my head"   . Metaxalone   . Red Dye     Nausea, swelling    Family History  Problem Relation Age of Onset  . Lung cancer Father     + smoker  . Dementia Mother   . Osteoporosis Mother     Lumbar spine  . Stroke Mother     x 5 @ 20 YOA  . Arthritis Mother     hands  . Emphysema Mother   . Alcohol abuse Paternal Grandfather   . Hypertension Maternal Grandfather     ?  . Stroke Paternal Grandfather     ?  Marland Kitchen Heart disease      grandmother  . Heart disease      grandfather    History   Social History  . Marital Status: Married    Spouse Name: N/A    Number of Children: 1  . Years of Education: N/A   Occupational History  . 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 Makes [employer]    Social History Main Topics  . Smoking status: Never Smoker   . Smokeless tobacco: Never Used     Comment: Lived with smokers x 23 yrs, smoked herself "for a week"  . Alcohol Use: No  . Drug Use: No  . Sexual Activity: Not Currently   Other Topics Concern  . Not on file   Social History Narrative   1 adopted child      Lives with husband              Constitutional: Denies fever, malaise, fatigue, headache or abrupt  weight changes. . Musculoskeletal: Pt reports tmj pain. Denies decrease in range of motion, difficulty with gait, muscle pain or joint pain and swelling.    No other specific complaints in a complete review of  systems (except as listed in HPI above).  Objective:   Physical Exam  BP 122/72  Pulse 77  Temp(Src) 97.8 F (36.6 C) (Oral)  Wt 159 lb 8 oz (72.349 kg)  BMI 26.13 kg/m2  SpO2 96% Wt Readings from Last 3 Encounters:  02/04/13 159 lb 8 oz (72.349 kg)  01/31/13 160 lb 4 oz (72.689 kg)  01/10/13 157 lb (71.215 kg)    General: Appears her stated age, well developed, well nourished in NAD. Cardiovascular: Normal rate and rhythm. S1,S2 noted.  No murmur, rubs or gallops noted. No JVD or BLE edema. No carotid bruits noted. Pulmonary/Chest: Normal effort and positive vesicular breath sounds. No respiratory distress. No wheezes, rales or ronchi noted.  Musculoskeletal: Normal range of motion. No signs of joint swelling. No difficulty with gait. Obvious pop at right TMJ with opening of the mouth.   BMET    Component Value Date/Time   NA 142 10/22/2012 1248   NA 138 09/13/2012 1449   K 4.7 10/22/2012 1248   CL 103 10/22/2012 1248   CO2 28 10/22/2012 1248   GLUCOSE 95 10/22/2012 1248   GLUCOSE 114* 09/13/2012 1449   BUN 35* 10/22/2012 1248   BUN 30* 09/13/2012 1449   CREATININE 1.4* 10/22/2012 1248   CREATININE 2.16* 04/01/2012 1740   CALCIUM 10.8* 10/22/2012 1248   GFRNONAA 32* 09/13/2012 1449   GFRAA 37* 09/13/2012 1449    Lipid Panel     Component Value Date/Time   CHOL 218* 02/16/2012 1001   TRIG 173.0* 02/16/2012 1001   HDL 55.30 02/16/2012 1001   CHOLHDL 4 02/16/2012 1001   VLDL 34.6 02/16/2012 1001   LDLCALC 82 01/24/2011 0747    CBC    Component Value Date/Time   WBC 8.8 07/31/2012 0923   RBC 4.16 07/31/2012 0923   HGB 12.2 07/31/2012 0923   HCT 36.6 07/31/2012 0923   PLT 241.0 07/31/2012 0923   MCV 88.2 07/31/2012 0923   MCH 29.8 04/01/2012 1740   MCHC 33.4 07/31/2012 0923   RDW 12.7 07/31/2012 0923   LYMPHSABS 2.0 07/31/2012 0923   MONOABS 0.7 07/31/2012 0923   EOSABS 0.1 07/31/2012 0923   BASOSABS 0.0 07/31/2012 0923    Hgb A1C No results found for this basename: HGBA1C          Assessment & Plan:   TMJ flare:  Already evaluated by ENT and oral surgery Failed pred taper-unable to take Nsaids Will try low dose flexeril at night to see if this improves her symptoms  RTC as needed or if symptoms persist or worsen

## 2013-02-05 ENCOUNTER — Other Ambulatory Visit: Payer: Self-pay | Admitting: Obstetrics

## 2013-02-05 ENCOUNTER — Telehealth: Payer: Self-pay

## 2013-02-05 ENCOUNTER — Ambulatory Visit
Admission: RE | Admit: 2013-02-05 | Discharge: 2013-02-05 | Disposition: A | Discharge status: Home / Self Care | Payer: Medicare Other | Source: Ambulatory Visit | Attending: Obstetrics | Admitting: Obstetrics

## 2013-02-05 DIAGNOSIS — M546 Pain in thoracic spine: Secondary | ICD-10-CM | POA: Diagnosis not present

## 2013-02-05 DIAGNOSIS — M810 Age-related osteoporosis without current pathological fracture: Secondary | ICD-10-CM

## 2013-02-05 DIAGNOSIS — IMO0002 Reserved for concepts with insufficient information to code with codable children: Secondary | ICD-10-CM | POA: Diagnosis not present

## 2013-02-05 NOTE — Telephone Encounter (Signed)
I am glad that she has stopped it. She has had allergic reactions to all treatments we have tried for her TMJ. There is nothing else we can offer her. I suggest she follow up with an oral surgeon.

## 2013-02-05 NOTE — Telephone Encounter (Signed)
Pt left v/m; pt seen 02/04/13, pt took cyclobenzaprine last night at bedtime; pt woke up x 6 during the night screaming with back and throat pain and pt cannot stand up well today due to pt being so exhausted; slight dizziness comes and goes . Pt said tongue has sores or whelps on tongue; pt using [facility] mouthwash for that. Pt said she is allergic to cyclobenzaprine.Please advise. Pt has stopped cyclobenzaprine. No CP or SOB. [facility] [address]. Pt request cb.

## 2013-02-05 NOTE — Telephone Encounter (Signed)
Pt said she is not going to have surgery for TMJ and does not want to contact the oral surgeon.pt said she will give a little time to see if will get better; pt is going to get tooth guard to see if that will help; pt will call back if not improved.

## 2013-02-06 NOTE — Telephone Encounter (Signed)
Agreed -

## 2013-02-11 ENCOUNTER — Encounter: Payer: Self-pay | Admitting: *Deleted

## 2013-02-12 ENCOUNTER — Encounter: Payer: Self-pay | Admitting: Family Medicine

## 2013-02-12 ENCOUNTER — Telehealth: Payer: Self-pay | Admitting: Family Medicine

## 2013-02-12 ENCOUNTER — Other Ambulatory Visit: Payer: Self-pay | Admitting: Family Medicine

## 2013-02-12 NOTE — Telephone Encounter (Signed)
dexa scan showing osteoporosis.  F/u per OBGYN

## 2013-02-15 ENCOUNTER — Encounter: Payer: Self-pay | Admitting: Internal Medicine

## 2013-02-18 ENCOUNTER — Other Ambulatory Visit (INDEPENDENT_AMBULATORY_CARE_PROVIDER_SITE_OTHER): Payer: Medicare Other

## 2013-02-18 ENCOUNTER — Other Ambulatory Visit: Payer: Self-pay

## 2013-02-18 DIAGNOSIS — R0602 Shortness of breath: Secondary | ICD-10-CM | POA: Diagnosis not present

## 2013-02-18 DIAGNOSIS — R000 Tachycardia, unspecified: Secondary | ICD-10-CM

## 2013-02-18 DIAGNOSIS — R079 Chest pain, unspecified: Secondary | ICD-10-CM

## 2013-02-18 DIAGNOSIS — R0789 Other chest pain: Secondary | ICD-10-CM

## 2013-02-19 ENCOUNTER — Ambulatory Visit (INDEPENDENT_AMBULATORY_CARE_PROVIDER_SITE_OTHER): Payer: Medicare Other | Admitting: Cardiovascular Disease

## 2013-02-19 ENCOUNTER — Encounter: Payer: Self-pay | Admitting: Cardiovascular Disease

## 2013-02-19 VITALS — BP 118/80 | HR 91 | Ht 65.0 in | Wt 157.0 lb

## 2013-02-19 DIAGNOSIS — R079 Chest pain, unspecified: Secondary | ICD-10-CM | POA: Diagnosis not present

## 2013-02-19 DIAGNOSIS — R600 Localized edema: Secondary | ICD-10-CM | POA: Insufficient documentation

## 2013-02-19 DIAGNOSIS — N179 Acute kidney failure, unspecified: Secondary | ICD-10-CM

## 2013-02-19 DIAGNOSIS — R1013 Epigastric pain: Secondary | ICD-10-CM | POA: Diagnosis not present

## 2013-02-19 DIAGNOSIS — I428 Other cardiomyopathies: Secondary | ICD-10-CM | POA: Diagnosis not present

## 2013-02-19 DIAGNOSIS — M999 Biomechanical lesion, unspecified: Secondary | ICD-10-CM | POA: Diagnosis not present

## 2013-02-19 DIAGNOSIS — N289 Disorder of kidney and ureter, unspecified: Secondary | ICD-10-CM

## 2013-02-19 DIAGNOSIS — IMO0002 Reserved for concepts with insufficient information to code with codable children: Secondary | ICD-10-CM | POA: Diagnosis not present

## 2013-02-19 DIAGNOSIS — M5137 Other intervertebral disc degeneration, lumbosacral region: Secondary | ICD-10-CM | POA: Diagnosis not present

## 2013-02-19 DIAGNOSIS — R609 Edema, unspecified: Secondary | ICD-10-CM

## 2013-02-19 DIAGNOSIS — N189 Chronic kidney disease, unspecified: Secondary | ICD-10-CM

## 2013-02-19 DIAGNOSIS — I420 Dilated cardiomyopathy: Secondary | ICD-10-CM

## 2013-02-19 NOTE — Progress Notes (Signed)
Patient ID: Ashley [name], female    DOB: 10/06/1945, 67 y.o.   MRN: [id number]  HPI Comments: 67 y/o female with history of fibromyalgia, irritable bowel, pneumonia, chronic cough felt secondary to GERD, abnormal PFTs in the past, shortness of breath dating back to September 2012 that presented acutely, CT scan showing progressive pleural effusion , echo showing Cardiomyopathy  ejection fraction estimated at 25-35%,  Previous  Lexiscan Myoview  showed no significant ischemia, no EKG changes concerning for ischemia.   Follow up admission to [facility] in December 2012 for bronchitis and hypoxia as well as systolic CHF. She had antibiotics, prednisone and diuresis and has felt better since discharge. ACE inhibitor placed on hold secondary to chronic cough.  S/P ICD 10 days ago for EF <35%.  Goal weight had been in the low 150 range. Previous Cardiac MRI showed ejection fraction 34 %    ICD placed  by Dr. Ladona Ridgel.  On previous office visits, she reported having malaise and fatigue, tachycardia, weight gain, leg swelling . She has been taking for torsemide pills per day with 4 potassium pills per day for weight gain. Weight is stable at 157 pounds at home. Today she reports having significant abdominal discomfort, general malaise, poor sleep, periodic leg swelling. Overall she does not feel well. Some bowel issues, occasional constipation. Also has back pain.  Recent echocardiogram done yesterday shows improvement in her ejection fraction up to 50%. Challenging image quality. On my own review, it does appear at least 45, if not 50%.   Problems in the past on Lipitor with cramping. Started on pravastatin.  EKG shows normal sinus rhythm with rate 91 beats per minute with nonspecific ST abnormality      Outpatient Encounter Prescriptions as of 02/19/2013  Medication Sig Dispense Refill  . ALPRAZolam (XANAX) 0.25 MG tablet TAKE 1 TABLET TWICE A DAY  60 tablet  0  . aspirin 81 MG EC  tablet Take 81 mg by mouth at bedtime.       Marland Kitchen CALCIUM-VITAMIN D PO Take 1 capsule by mouth daily.      . carvedilol (COREG) 12.5 MG tablet Take 6.25 mg by mouth 3 (three) times daily.      . Coenzyme Q10 (COQ-10) 150 MG CAPS Take 1 tablet by mouth daily.      Marland Kitchen dextromethorphan (DELSYM) 30 MG/5ML liquid Take 60 mg by mouth as needed. For cough/congestion.      . diclofenac sodium (VOLTAREN) 1 % GEL Apply 1 application topically 3 (three) times daily.  1 Tube  1  . metolazone (ZAROXOLYN) 2.5 MG tablet Take 2.5 mg by mouth as needed.      . midodrine (PROAMATINE) 2.5 MG tablet Take 1 tablet (2.5 mg total) by mouth 3 (three) times daily as needed.  90 tablet  6  . omeprazole (PRILOSEC) 40 MG capsule Take 40 mg by mouth 2 (two) times daily.      . potassium chloride (K-DUR) 10 MEQ tablet Take 1-4 tablets (10-40 mEq total) by mouth daily as needed. for every 10mg  toresmide  120 tablet  11  . pravastatin (PRAVACHOL) 80 MG tablet Take 1 tablet (80 mg total) by mouth every 3 (three) days.      Marland Kitchen spironolactone (ALDACTONE) 25 MG tablet TAKE 1 TABLET (25 MG TOTAL) BY MOUTH DAILY.  90 tablet  3  . torsemide (DEMADEX) 10 MG tablet 1-4 tablets daily depending on weight (goal 154-155lbs)      .  vitamin C (ASCORBIC ACID) 500 MG tablet Take 500 mg by mouth 2 (two) times daily.       Marland Kitchen zolpidem (AMBIEN) 5 MG tablet TAKE 1/2 TO 1 TABLET BY MOUTH AT BEDTIME AS NEEDED  30 tablet  0   Review of Systems  Constitutional: Positive for fatigue.  Eyes: Negative.   Respiratory: Negative.   Gastrointestinal: Positive for abdominal pain.  Musculoskeletal: Positive for arthralgias, back pain, myalgias and neck stiffness.  Skin: Negative.   Neurological: Negative.   Psychiatric/Behavioral: Negative.   All other systems reviewed and are negative.    BP 118/80  Pulse 91  Ht 5\' 5"  (1.651 m)  Wt 157 lb (71.215 kg)  BMI 26.13 kg/m2  Physical Exam  Nursing note and vitals reviewed. Constitutional: She is  oriented to person, place, and time. She appears well-developed and well-nourished.  HENT:  Head: Normocephalic.  Nose: Nose normal.  Mouth/Throat: Oropharynx is clear and moist.  Eyes: Conjunctivae are normal. Pupils are equal, round, and reactive to light.  Neck: Normal range of motion. Neck supple. No JVD present.  Cardiovascular: Normal rate, regular rhythm, S1 normal, S2 normal, normal heart sounds and intact distal pulses.  Exam reveals no gallop and no friction rub.   No murmur heard. Pulmonary/Chest: Effort normal and breath sounds normal. No respiratory distress. She has no wheezes. She has no rales. She exhibits no tenderness.  Abdominal: Soft. Bowel sounds are normal. She exhibits no distension. There is no tenderness.  Musculoskeletal: Normal range of motion. She exhibits no edema and no tenderness.  Lymphadenopathy:    She has no cervical adenopathy.  Neurological: She is alert and oriented to person, place, and time. Coordination normal.  Skin: Skin is warm and dry. No rash noted. No erythema.  Psychiatric: She has a normal mood and affect. Her behavior is normal. Judgment and thought content normal.    Assessment and Plan

## 2013-02-19 NOTE — Assessment & Plan Note (Signed)
Elevated renal function in the past likely from mild dehydration. We will check renal function again today. Recommended she decrease her torsemide dosing.

## 2013-02-19 NOTE — Assessment & Plan Note (Signed)
No significant lower extremity edema on today's visit. I do not think recent edema is from heart failure given her good ejection fraction. We'll try to decrease her diuretic regimen dosing. Suggested she try compression hose for edema. She is sitting at home/very sedentary and this could be part of the problem. Possible dependent edema.

## 2013-02-19 NOTE — Assessment & Plan Note (Signed)
Significant improvement in her ejection fraction now up to 50%. General malaise is likely unrelated to her cardiac issues. We'll check a basic metabolic panel as I am concerned about dehydration. We have suggested she cut her torsemide back to one a day for now until lab results are available.

## 2013-02-19 NOTE — Patient Instructions (Addendum)
You are doing well. Please cut the torsemide back to 1 a day, extra dose for worsening leg edema We will check you renal function today  Please call us if you have new issues that need to be addressed before your next appt.  Your physician wants you to follow-up in: 3 months.  You will receive a reminder letter in the mail two months in advance. If you don't receive a letter, please call our office to schedule the follow-up appointment.

## 2013-02-19 NOTE — Assessment & Plan Note (Signed)
He continues to have significant GI discomfort. Uncertain if this is from fibromyalgia or other etiology. Recommended daily fiber.

## 2013-02-20 ENCOUNTER — Ambulatory Visit (INDEPENDENT_AMBULATORY_CARE_PROVIDER_SITE_OTHER): Payer: Medicare Other | Admitting: *Deleted

## 2013-02-20 DIAGNOSIS — E7800 Pure hypercholesterolemia, unspecified: Secondary | ICD-10-CM | POA: Diagnosis not present

## 2013-02-20 DIAGNOSIS — I428 Other cardiomyopathies: Secondary | ICD-10-CM

## 2013-02-20 DIAGNOSIS — I509 Heart failure, unspecified: Secondary | ICD-10-CM | POA: Diagnosis not present

## 2013-02-20 DIAGNOSIS — I5022 Chronic systolic (congestive) heart failure: Secondary | ICD-10-CM

## 2013-02-20 DIAGNOSIS — I420 Dilated cardiomyopathy: Secondary | ICD-10-CM

## 2013-02-21 LAB — BASIC METABOLIC PANEL
BUN/Creatinine Ratio: 16 (ref 11–26)
BUN: 32 mg/dL — ABNORMAL HIGH (ref 8–27)
CO2: 26 mmol/L (ref 18–29)
Calcium: 10.4 mg/dL — ABNORMAL HIGH (ref 8.6–10.2)
Chloride: 95 mmol/L — ABNORMAL LOW (ref 97–108)
Creatinine, Ser: 1.96 mg/dL — ABNORMAL HIGH (ref 0.57–1.00)
GFR calc Af Amer: 30 mL/min/{1.73_m2} — ABNORMAL LOW (ref >59)
GFR calc non Af Amer: 26 mL/min/{1.73_m2} — ABNORMAL LOW (ref >59)
Glucose: 153 mg/dL — ABNORMAL HIGH (ref 65–99)
Potassium: 4.3 mmol/L (ref 3.5–5.2)
Sodium: 141 mmol/L (ref 134–144)

## 2013-02-24 ENCOUNTER — Telehealth: Payer: Self-pay

## 2013-02-24 DIAGNOSIS — M999 Biomechanical lesion, unspecified: Secondary | ICD-10-CM | POA: Diagnosis not present

## 2013-02-24 DIAGNOSIS — M5137 Other intervertebral disc degeneration, lumbosacral region: Secondary | ICD-10-CM | POA: Diagnosis not present

## 2013-02-24 DIAGNOSIS — IMO0002 Reserved for concepts with insufficient information to code with codable children: Secondary | ICD-10-CM | POA: Diagnosis not present

## 2013-02-24 DIAGNOSIS — N289 Disorder of kidney and ureter, unspecified: Secondary | ICD-10-CM

## 2013-02-24 NOTE — Telephone Encounter (Signed)
Pt would like lab results.  

## 2013-02-25 DIAGNOSIS — M818 Other osteoporosis without current pathological fracture: Secondary | ICD-10-CM | POA: Diagnosis not present

## 2013-02-26 DIAGNOSIS — M5137 Other intervertebral disc degeneration, lumbosacral region: Secondary | ICD-10-CM | POA: Diagnosis not present

## 2013-02-26 DIAGNOSIS — IMO0002 Reserved for concepts with insufficient information to code with codable children: Secondary | ICD-10-CM | POA: Diagnosis not present

## 2013-02-26 DIAGNOSIS — M999 Biomechanical lesion, unspecified: Secondary | ICD-10-CM | POA: Diagnosis not present

## 2013-02-26 NOTE — Telephone Encounter (Signed)
She is dehydrated based on labs As I suggested on last visit, we need to decreased torsemide down to one a day Would in fact hold all diuretics for a few days, through the weekend Would monitor weight and only take more than torsemide for increase in weight of 3 or more pounds (based on weight on 03/03/13)

## 2013-02-27 NOTE — Telephone Encounter (Signed)
I called and spoke with the patient. She states that her weight is up to 160 lbs on her home scale. She states she was told to keep her weight around 155 lbs, but she has been 158- 160 lbs at home. The patient has dropped her torsemide to once daily. She took a metolazone this morning due to a weight of 160 lbs. She has not taken torsemide, but has taken spironolactone. She is not wearing support hose. I asked about her amount of activity during the day. She states she just got her bone density results back yesterday and her back is 11 % worse and hip 9 % worse than previous testing. She was given an exercise protocol to follow.  Advised I would review with Dr. Mariah Milling, since weight is already up 3 lbs from her weight in the office. I will call her back with recommendations. Sherri Rad, RN, BSN  Per Dr. Mariah Milling, he thinks the patient is dry. She needs to hold torsemide this weekend regardless of today's weight. She needs to gain about 3-5 lbs and start checking her weight next week. She should take torsemide based off her weight next week. She will need a repeat BMP in about 3 weeks. Sherri Rad, RN, BSN

## 2013-02-27 NOTE — Telephone Encounter (Signed)
I spoke with the patient. She is aware to hold torsemide and metolazone over the weekend. She has also been made aware to let her weight trend upward at least 3- 5 lbs from today's weight. She will then use that as her baseline as to when to take torsemide. I advised her to obtain compression socks from [employer] per Dr. Mariah Milling. She will come in 3 weeks for a repeat BMP.

## 2013-02-28 DIAGNOSIS — D1801 Hemangioma of skin and subcutaneous tissue: Secondary | ICD-10-CM | POA: Diagnosis not present

## 2013-02-28 DIAGNOSIS — L723 Sebaceous cyst: Secondary | ICD-10-CM | POA: Diagnosis not present

## 2013-02-28 DIAGNOSIS — H6120 Impacted cerumen, unspecified ear: Secondary | ICD-10-CM | POA: Diagnosis not present

## 2013-02-28 DIAGNOSIS — D214 Benign neoplasm of connective and other soft tissue of abdomen: Secondary | ICD-10-CM | POA: Diagnosis not present

## 2013-02-28 DIAGNOSIS — L821 Other seborrheic keratosis: Secondary | ICD-10-CM | POA: Diagnosis not present

## 2013-02-28 DIAGNOSIS — H9209 Otalgia, unspecified ear: Secondary | ICD-10-CM | POA: Diagnosis not present

## 2013-02-28 DIAGNOSIS — L820 Inflamed seborrheic keratosis: Secondary | ICD-10-CM | POA: Diagnosis not present

## 2013-02-28 DIAGNOSIS — B079 Viral wart, unspecified: Secondary | ICD-10-CM | POA: Diagnosis not present

## 2013-02-28 DIAGNOSIS — M26609 Unspecified temporomandibular joint disorder, unspecified side: Secondary | ICD-10-CM | POA: Diagnosis not present

## 2013-02-28 DIAGNOSIS — D1739 Benign lipomatous neoplasm of skin and subcutaneous tissue of other sites: Secondary | ICD-10-CM | POA: Diagnosis not present

## 2013-03-03 DIAGNOSIS — IMO0002 Reserved for concepts with insufficient information to code with codable children: Secondary | ICD-10-CM | POA: Diagnosis not present

## 2013-03-03 DIAGNOSIS — M999 Biomechanical lesion, unspecified: Secondary | ICD-10-CM | POA: Diagnosis not present

## 2013-03-03 DIAGNOSIS — M5137 Other intervertebral disc degeneration, lumbosacral region: Secondary | ICD-10-CM | POA: Diagnosis not present

## 2013-03-05 DIAGNOSIS — M999 Biomechanical lesion, unspecified: Secondary | ICD-10-CM | POA: Diagnosis not present

## 2013-03-05 DIAGNOSIS — M5137 Other intervertebral disc degeneration, lumbosacral region: Secondary | ICD-10-CM | POA: Diagnosis not present

## 2013-03-05 DIAGNOSIS — IMO0002 Reserved for concepts with insufficient information to code with codable children: Secondary | ICD-10-CM | POA: Diagnosis not present

## 2013-03-10 ENCOUNTER — Ambulatory Visit (INDEPENDENT_AMBULATORY_CARE_PROVIDER_SITE_OTHER): Payer: Medicare Other

## 2013-03-10 DIAGNOSIS — N289 Disorder of kidney and ureter, unspecified: Secondary | ICD-10-CM | POA: Diagnosis not present

## 2013-03-11 ENCOUNTER — Telehealth: Payer: Self-pay | Admitting: *Deleted

## 2013-03-11 LAB — BASIC METABOLIC PANEL
BUN/Creatinine Ratio: 15 (ref 11–26)
BUN: 17 mg/dL (ref 8–27)
CO2: 20 mmol/L (ref 18–29)
Calcium: 9.6 mg/dL (ref 8.6–10.2)
Chloride: 104 mmol/L (ref 97–108)
Creatinine, Ser: 1.15 mg/dL — ABNORMAL HIGH (ref 0.57–1.00)
GFR calc Af Amer: 57 mL/min/{1.73_m2} — ABNORMAL LOW (ref >59)
GFR calc non Af Amer: 49 mL/min/{1.73_m2} — ABNORMAL LOW (ref >59)
Glucose: 105 mg/dL — ABNORMAL HIGH (ref 65–99)
Potassium: 4.3 mmol/L (ref 3.5–5.2)
Sodium: 143 mmol/L (ref 134–144)

## 2013-03-11 NOTE — Telephone Encounter (Signed)
I spoke with the patient regarding her lab results. She states that she has periodically taken torsemide for weights of 163 lbs. She feels better off torsemide. I advised that her baseline weight may have shifted to around 161- 162 lbs and that unless she is having symptoms or hits around 165 lbs, she should be ok off torsemide. She voices understanding.

## 2013-03-12 DIAGNOSIS — M5137 Other intervertebral disc degeneration, lumbosacral region: Secondary | ICD-10-CM | POA: Diagnosis not present

## 2013-03-12 DIAGNOSIS — M999 Biomechanical lesion, unspecified: Secondary | ICD-10-CM | POA: Diagnosis not present

## 2013-03-12 DIAGNOSIS — IMO0002 Reserved for concepts with insufficient information to code with codable children: Secondary | ICD-10-CM | POA: Diagnosis not present

## 2013-03-20 ENCOUNTER — Other Ambulatory Visit: Payer: Medicare Other

## 2013-03-23 ENCOUNTER — Other Ambulatory Visit: Payer: Self-pay | Admitting: Family Medicine

## 2013-03-23 NOTE — Telephone Encounter (Signed)
plz phone in. 

## 2013-03-24 NOTE — Telephone Encounter (Signed)
Rx called in as directed.   

## 2013-03-26 DIAGNOSIS — IMO0002 Reserved for concepts with insufficient information to code with codable children: Secondary | ICD-10-CM | POA: Diagnosis not present

## 2013-03-26 DIAGNOSIS — M5137 Other intervertebral disc degeneration, lumbosacral region: Secondary | ICD-10-CM | POA: Diagnosis not present

## 2013-03-26 DIAGNOSIS — M999 Biomechanical lesion, unspecified: Secondary | ICD-10-CM | POA: Diagnosis not present

## 2013-03-28 DIAGNOSIS — D1739 Benign lipomatous neoplasm of skin and subcutaneous tissue of other sites: Secondary | ICD-10-CM | POA: Diagnosis not present

## 2013-03-28 DIAGNOSIS — L821 Other seborrheic keratosis: Secondary | ICD-10-CM | POA: Diagnosis not present

## 2013-03-28 DIAGNOSIS — L570 Actinic keratosis: Secondary | ICD-10-CM | POA: Diagnosis not present

## 2013-03-28 DIAGNOSIS — D0460 Carcinoma in situ of skin of unspecified upper limb, including shoulder: Secondary | ICD-10-CM | POA: Diagnosis not present

## 2013-03-31 ENCOUNTER — Other Ambulatory Visit: Payer: Self-pay | Admitting: Cardiovascular Disease

## 2013-04-01 ENCOUNTER — Other Ambulatory Visit: Payer: Self-pay | Admitting: Family Medicine

## 2013-04-01 ENCOUNTER — Other Ambulatory Visit: Payer: Self-pay | Admitting: *Deleted

## 2013-04-01 MED ORDER — CARVEDILOL 12.5 MG PO TABS: 6.2500 mg | ORAL_TABLET | Freq: Two times a day (BID) | ORAL | Status: DC

## 2013-04-01 NOTE — Telephone Encounter (Signed)
Requested Prescriptions   Signed Prescriptions Disp Refills  . carvedilol (COREG) 12.5 MG tablet 90 tablet 3    Sig: Take 0.5 tablets (6.25 mg total) by mouth 2 (two) times daily with a meal.    Authorizing Provider: Antonieta Iba    Ordering User: Kendrick Fries

## 2013-04-01 NOTE — Telephone Encounter (Signed)
This was phoned in 03/23/2013.

## 2013-04-01 NOTE — Telephone Encounter (Signed)
Ok to refill 

## 2013-04-02 DIAGNOSIS — M999 Biomechanical lesion, unspecified: Secondary | ICD-10-CM | POA: Diagnosis not present

## 2013-04-02 DIAGNOSIS — M5137 Other intervertebral disc degeneration, lumbosacral region: Secondary | ICD-10-CM | POA: Diagnosis not present

## 2013-04-02 DIAGNOSIS — IMO0002 Reserved for concepts with insufficient information to code with codable children: Secondary | ICD-10-CM | POA: Diagnosis not present

## 2013-04-17 DIAGNOSIS — R350 Frequency of micturition: Secondary | ICD-10-CM | POA: Diagnosis not present

## 2013-04-17 DIAGNOSIS — B373 Candidiasis of vulva and vagina: Secondary | ICD-10-CM | POA: Diagnosis not present

## 2013-04-17 DIAGNOSIS — M810 Age-related osteoporosis without current pathological fracture: Secondary | ICD-10-CM | POA: Diagnosis not present

## 2013-04-28 DIAGNOSIS — L820 Inflamed seborrheic keratosis: Secondary | ICD-10-CM | POA: Diagnosis not present

## 2013-05-05 ENCOUNTER — Ambulatory Visit (INDEPENDENT_AMBULATORY_CARE_PROVIDER_SITE_OTHER): Payer: Medicare Other | Admitting: *Deleted

## 2013-05-05 DIAGNOSIS — I420 Dilated cardiomyopathy: Secondary | ICD-10-CM

## 2013-05-05 DIAGNOSIS — I509 Heart failure, unspecified: Secondary | ICD-10-CM | POA: Diagnosis not present

## 2013-05-05 DIAGNOSIS — I428 Other cardiomyopathies: Secondary | ICD-10-CM | POA: Diagnosis not present

## 2013-05-05 DIAGNOSIS — I5022 Chronic systolic (congestive) heart failure: Secondary | ICD-10-CM | POA: Diagnosis not present

## 2013-05-06 LAB — MDC_IDC_ENUM_SESS_TYPE_REMOTE
Battery Voltage: 3.16 V
Brady Statistic RV Percent Paced: 0.01 %
Date Time Interrogation Session: 20141223192556
HighPow Impedance: 78 Ohm
Lead Channel Impedance Value: 589 Ohm
Lead Channel Pacing Threshold Amplitude: 0.5 V
Lead Channel Pacing Threshold Pulse Width: 0.4 ms
Lead Channel Sensing Intrinsic Amplitude: 8.875 mV
Lead Channel Setting Pacing Amplitude: 2.5 V
Lead Channel Setting Pacing Pulse Width: 0.4 ms
Lead Channel Setting Sensing Sensitivity: 0.3 mV
Zone Setting Detection Interval: 300 ms
Zone Setting Detection Interval: 360 ms
Zone Setting Detection Interval: 450 ms

## 2013-05-07 ENCOUNTER — Ambulatory Visit (INDEPENDENT_AMBULATORY_CARE_PROVIDER_SITE_OTHER): Payer: Medicare Other | Admitting: Family Medicine

## 2013-05-07 ENCOUNTER — Encounter: Payer: Self-pay | Admitting: Family Medicine

## 2013-05-07 VITALS — BP 108/60 | HR 92 | Temp 98.2°F | Wt 160.5 lb

## 2013-05-07 DIAGNOSIS — M25551 Pain in right hip: Secondary | ICD-10-CM | POA: Insufficient documentation

## 2013-05-07 DIAGNOSIS — M25559 Pain in unspecified hip: Secondary | ICD-10-CM

## 2013-05-07 DIAGNOSIS — R112 Nausea with vomiting, unspecified: Secondary | ICD-10-CM

## 2013-05-07 NOTE — Assessment & Plan Note (Signed)
Doubt lidocaine reaction although pt does endorse sxs of nausea/vomiting after every local anesthesia procedure with lidocaine More suspicious for recently commenced bisphosphonate causing worsening GERD and epigastric discomfort associated with nausea/vomiting. Advised hold fosamax this week, then retrial next week and call me or OBGYN with update. ?dissolvable bisphosphonate

## 2013-05-07 NOTE — Assessment & Plan Note (Signed)
sxs consistent with R GT bursitis. Supportive care with exercises from [facility] pt advisor today as well as continued use of voltaren gel. Will refer to [facility] for possible injection if sxs not resolved with above.

## 2013-05-07 NOTE — Progress Notes (Signed)
Pre-visit discussion using our clinic review tool. No additional management support is needed unless otherwise documented below in the visit note.  

## 2013-05-07 NOTE — Progress Notes (Signed)
   Subjective:    Patient ID: Ashley [name], female    DOB: Jul 27, 1945, 67 y.o.   MRN: [id number]  HPI CC: not feeling well  R hip pain - ongoing for months.  Worse pain with prolonged walking.  Denies inciting trauma/injury.  No shooting pain down leg.  She has started walking on treadmill 3-4 times weekly over last few months, actually thinks hip pain may have come on with increased walking.  Nausea/vomiting last night - wonders if due to lidocaine from recent skin tag removal as every time she's had lidocaine she has nausea/vomiting afterwards.  Did recently start fosamax.  Osteoporosis - fosamax was recently started. ([facility]).  Noticing some epigastric discomfort.   Past Medical History  Diagnosis Date  . Migraine without aura, without mention of intractable migraine without mention of status migrainosus   . History of diverticulitis of colon   . Pure hypercholesterolemia   . Irritable bowel syndrome   . Laryngeal nodule     Dr. Thelma Comp  . Fibrocystic breast disease   . Osteoarthritis   . Hemorrhoids   . Anxiety   . GERD (gastroesophageal reflux disease)   . Systolic CHF     EF 25-35%, LV dilation, significant MR, goal weight 150lbs  . Nonischemic cardiomyopathy   . History of pneumonia 2012  . Mitral regurgitation   . Anxiety   . Bronchiectasis     possibly mild, treat URIs aggressively  . Hypertension   . Heart murmur   . Angina   . Lung disease 07/14/11  . Sleep apnea   . Shortness of breath     "resting"  . Anemia   . Fibromyalgia   . Depression   . Disorder of vocal cords   . Chronic sinusitis   . Insomnia   . Osteoporosis 01/2013     Review of Systems Per HPI    Objective:   Physical Exam  Nursing note and vitals reviewed. Constitutional: She appears well-developed and well-nourished. No distress.  HENT:  Mouth/Throat: Oropharynx is clear and moist. No oropharyngeal exudate.  Cardiovascular: Normal rate, regular rhythm, normal heart sounds  and intact distal pulses.   No murmur heard. Pulmonary/Chest: Effort normal and breath sounds normal. No respiratory distress. She has no wheezes. She has no rales.  Abdominal: Soft. Normal appearance and bowel sounds are normal. She exhibits no distension and no mass. There is generalized tenderness (mild). There is no rigidity, no rebound, no guarding, no CVA tenderness and negative Murphy's sign.  Musculoskeletal: She exhibits no edema.  Neg SLR bilaterally Neg FABER No paint with int/ext rotation at hip + mild tenderness at SIJ L>R + main tenderness at R GTB       Assessment & Plan:

## 2013-05-07 NOTE — Patient Instructions (Signed)
I think you have right hip bursitis - treat with stretching exercises provided.  Back off walking then slowly increase activity. Continue voltaren gel.  I will refer you to sports medicine doctor for evaluation of bursitis. For nausea - I worry about this being related to fosamax as it can worsen GERD - if symptoms persist, we may need to find a different medication. Good to see you today, call us with quesitons. Merry Christmas

## 2013-05-09 ENCOUNTER — Ambulatory Visit: Payer: Medicare Other

## 2013-05-09 ENCOUNTER — Telehealth (INDEPENDENT_AMBULATORY_CARE_PROVIDER_SITE_OTHER): Payer: Medicare Other

## 2013-05-09 ENCOUNTER — Other Ambulatory Visit (INDEPENDENT_AMBULATORY_CARE_PROVIDER_SITE_OTHER): Payer: Medicare Other

## 2013-05-09 DIAGNOSIS — R109 Unspecified abdominal pain: Secondary | ICD-10-CM

## 2013-05-09 DIAGNOSIS — R1030 Lower abdominal pain, unspecified: Secondary | ICD-10-CM

## 2013-05-09 DIAGNOSIS — R112 Nausea with vomiting, unspecified: Secondary | ICD-10-CM

## 2013-05-09 LAB — COMPREHENSIVE METABOLIC PANEL
ALT: 25 U/L (ref 0–35)
AST: 29 U/L (ref 0–37)
Albumin: 4.4 g/dL (ref 3.5–5.2)
Alkaline Phosphatase: 117 U/L (ref 39–117)
BUN: 27 mg/dL — ABNORMAL HIGH (ref 6–23)
CO2: 25 mEq/L (ref 19–32)
Calcium: 9.3 mg/dL (ref 8.4–10.5)
Chloride: 100 mEq/L (ref 96–112)
Creatinine, Ser: 1.6 mg/dL — ABNORMAL HIGH (ref 0.4–1.2)
GFR: 34.59 mL/min — ABNORMAL LOW (ref >60.00)
Glucose, Bld: 96 mg/dL (ref 70–99)
Potassium: 3.6 mEq/L (ref 3.5–5.1)
Sodium: 136 mEq/L (ref 135–145)
Total Bilirubin: 0.4 mg/dL (ref 0.3–1.2)
Total Protein: 8 g/dL (ref 6.0–8.3)

## 2013-05-09 LAB — CBC WITH DIFFERENTIAL/PLATELET
Basophils Absolute: 0 10*3/uL (ref 0.0–0.1)
Basophils Relative: 0.5 % (ref 0.0–3.0)
Eosinophils Absolute: 0 10*3/uL (ref 0.0–0.7)
Eosinophils Relative: 0.1 % (ref 0.0–5.0)
HCT: 37 % (ref 36.0–46.0)
Hemoglobin: 12.5 g/dL (ref 12.0–15.0)
Lymphocytes Relative: 21.1 % (ref 12.0–46.0)
Lymphs Abs: 1.4 10*3/uL (ref 0.7–4.0)
MCHC: 33.7 g/dL (ref 30.0–36.0)
MCV: 87.4 fl (ref 78.0–100.0)
Monocytes Absolute: 1.3 10*3/uL — ABNORMAL HIGH (ref 0.1–1.0)
Monocytes Relative: 19.7 % — ABNORMAL HIGH (ref 3.0–12.0)
Neutro Abs: 3.9 10*3/uL (ref 1.4–7.7)
Neutrophils Relative %: 58.6 % (ref 43.0–77.0)
Platelets: 158 10*3/uL (ref 150.0–400.0)
RBC: 4.23 Mil/uL (ref 3.87–5.11)
RDW: 12.4 % (ref 11.5–14.6)
WBC: 6.7 10*3/uL (ref 4.5–10.5)

## 2013-05-09 LAB — LIPASE: Lipase: 40 U/L (ref 11.0–59.0)

## 2013-05-09 NOTE — Telephone Encounter (Signed)
plz call today for an update on how she's feeling - any documented fevers, or any more vomiting/diarrhea?

## 2013-05-09 NOTE — Telephone Encounter (Signed)
Pt states she has gone to the bathroom with Diarrhea 3 times since 5:30am. Pt states she has been experiencing joint pains at night while sleeping and it wakes her up. No abd pain, but states that she did have cramps/pain during vomiting. Pt states she can come in today if needed--please advise

## 2013-05-09 NOTE — Telephone Encounter (Signed)
Record Num: [id number] Operator: Maryfrances Bunnell Patient Name: [name] Call Date & Time: 05/07/2013 2:13:00PM Patient Phone: [phone] PCP: [name] Patient Gender: Female PCP Fax : [phone] Patient DOB: 1946/01/20 Practice Name: [facility] Reason for Call: Caller: Ashley [name]/Patient; PCP: [name] (Family Practice); CB#: [phone]; Call regarding Vomiting; Vomiting and diarrhea onset 05/07/23; Seen in office for her hip pain this am and discussed with md, thought to be recent Lidocain injection or Fosamax upsetting her GERD; Since home from seeing md vomiting x 2; Was in the kitchen going to get some pots out of the cabinet, bent over and felt sick, then felt faint and weak, dizzy, says everything blacked out but didn't pass out, went to the bathroom and had some loose stool, has had 3 episodes of diarrhea so far today; Was able to get herself to the couch,used a cold cloth, some dizziness but resolved within minutes of sitting down. No sob or chest pain; Drinking water now; Urinated x 2 today; Per Nausea or Vomiting Protocol "New onset of 3-4 episodes vomiting or diarrhea following mild abdominal cramping." Home care advice given; Emergent symptoms reviewed; Standing orders used for Phenergan 25 mg tabs 1 po Q 4 hrs prn # 6 per Dr. Demetrio Lapping MD called to [facility] [phone]. Protocol(s) Used: Nausea or Vomiting Recommended Outcome per Protocol: Provide Home/Self Care Reason for Outcome: New onset of 3-4 episodes vomiting or diarrhea following mild abdominal cramping Care Advice: Call provider immediately if develop severe pain, black, tarry stools, bloody stools, blood-streaked or coffee ground-looking vomitus, or abdomen swollen. ~ ~ SYMPTOM / CONDITION MANAGEMENT Vomiting and Diarrhea Care: - Do not eat solid foods until vomiting subsides. - Begin taking fluids by sucking on ice chips or popsicles or taking sips of cool, clear fluids (soda,  fruit juices that are low acid, sports drinks or nonprescription oral rehydration solution). - Gradually drink larger amounts of these fluids so that you are drinking six to eight 8 oz. (1.2 to 1.6 liters) of fluids a day. - Keep activity to a minimum. - Once vomiting and diarrhea subside, eat smaller, more frequent meals of easily digested foods such as crackers, toast, bananas, rice, cooked cereal, applesauce, broth, baked or mashed potatoes, chicken or [place] without skin. Eat slowly. - Take fluids 30 minutes before or 60 minutes after meals. - Avoid high fat, highly seasoned, high fiber or high sugar content foods. - Avoid extremely hot or cold foods. - Do not take pain medication (such as aspirin, NSAIDs) while nauseated or vomiting. - Do not drink caffeinated or alcoholic beverages. ~ Go to the ED if you have developed signs and symptoms of dehydration such as very dry mouth and tongue; increased pulse rate at rest; no urine output for 12 hours or more; increasing weakness or drowsiness, or lightheadedness when trying to sit upright or standing. ~ 12/

## 2013-05-09 NOTE — Telephone Encounter (Signed)
i'd like her to come in today for a lab visit to evaluate for diverticulitis - ordered

## 2013-05-09 NOTE — Telephone Encounter (Signed)
lmovm for pt to call back to make appt for labs to be drawn

## 2013-05-12 NOTE — Telephone Encounter (Signed)
Patient came in last week for labs.

## 2013-05-13 LAB — PATHOLOGIST SMEAR REVIEW

## 2013-05-14 ENCOUNTER — Other Ambulatory Visit: Payer: Self-pay | Admitting: Family Medicine

## 2013-05-14 DIAGNOSIS — D72821 Monocytosis (symptomatic): Secondary | ICD-10-CM

## 2013-05-21 DIAGNOSIS — IMO0002 Reserved for concepts with insufficient information to code with codable children: Secondary | ICD-10-CM | POA: Diagnosis not present

## 2013-05-21 DIAGNOSIS — M5137 Other intervertebral disc degeneration, lumbosacral region: Secondary | ICD-10-CM | POA: Diagnosis not present

## 2013-05-21 DIAGNOSIS — M999 Biomechanical lesion, unspecified: Secondary | ICD-10-CM | POA: Diagnosis not present

## 2013-05-22 ENCOUNTER — Telehealth: Payer: Self-pay | Admitting: Family Medicine

## 2013-05-22 NOTE — Telephone Encounter (Signed)
Noted. Referral cancelled. 

## 2013-05-22 NOTE — Addendum Note (Signed)
Addended by: [name] on: 05/22/2013 08:02 PM   Modules accepted: Orders

## 2013-05-22 NOTE — Telephone Encounter (Signed)
Patient called back  , does not want referral to Sports Medicine   For hip pain

## 2013-05-23 DIAGNOSIS — H2510 Age-related nuclear cataract, unspecified eye: Secondary | ICD-10-CM | POA: Diagnosis not present

## 2013-05-26 ENCOUNTER — Encounter: Payer: Self-pay | Admitting: Cardiovascular Disease

## 2013-05-26 ENCOUNTER — Ambulatory Visit (INDEPENDENT_AMBULATORY_CARE_PROVIDER_SITE_OTHER): Payer: Medicare Other | Admitting: Cardiovascular Disease

## 2013-05-26 VITALS — BP 100/68 | HR 80 | Ht 65.5 in | Wt 163.0 lb

## 2013-05-26 DIAGNOSIS — I428 Other cardiomyopathies: Secondary | ICD-10-CM

## 2013-05-26 DIAGNOSIS — I951 Orthostatic hypotension: Secondary | ICD-10-CM

## 2013-05-26 DIAGNOSIS — M5137 Other intervertebral disc degeneration, lumbosacral region: Secondary | ICD-10-CM | POA: Diagnosis not present

## 2013-05-26 DIAGNOSIS — IMO0002 Reserved for concepts with insufficient information to code with codable children: Secondary | ICD-10-CM | POA: Diagnosis not present

## 2013-05-26 DIAGNOSIS — M999 Biomechanical lesion, unspecified: Secondary | ICD-10-CM | POA: Diagnosis not present

## 2013-05-26 DIAGNOSIS — I4902 Ventricular flutter: Secondary | ICD-10-CM | POA: Diagnosis not present

## 2013-05-26 DIAGNOSIS — I420 Dilated cardiomyopathy: Secondary | ICD-10-CM

## 2013-05-26 DIAGNOSIS — I498 Other specified cardiac arrhythmias: Secondary | ICD-10-CM

## 2013-05-26 NOTE — Assessment & Plan Note (Signed)
Relatively euvolemic on today's visit. Suggested she not be too aggressive with her diuretic regimen given recent dehydration. Again would hold diuretics for weight less than 162 pounds

## 2013-05-26 NOTE — Patient Instructions (Signed)
You are doing well. No medication changes were made.  Please take extra coreg as needed for tachycardia Ok to take midodrine for low blood pressure  Please call us if you have new issues that need to be addressed before your next appt.  Your physician wants you to follow-up in: 6 months.  You will receive a reminder letter in the mail two months in advance. If you don't receive a letter, please call our office to schedule the follow-up appointment.

## 2013-05-26 NOTE — Progress Notes (Signed)
Patient ID: Ashley [name], female    DOB: 02/07/1946, 68 y.o.   MRN: [id number]  HPI Comments: 68 y/o female with history of fibromyalgia, irritable bowel, pneumonia, chronic cough felt secondary to GERD, abnormal PFTs in the past, shortness of breath dating back to September 2012 that presented acutely, CT scan showing progressive pleural effusion , echo showing Cardiomyopathy  ejection fraction estimated at 25-35%,  Previous  Lexiscan Myoview  showed no significant ischemia, no EKG changes concerning for ischemia.   Follow up admission to [facility] in December 2012 for bronchitis and hypoxia as well as systolic CHF. She had antibiotics, prednisone and diuresis and has felt better since discharge. ACE inhibitor placed on hold secondary to chronic cough.  S/P ICD 10 days ago for EF <35%.  Goal weight had been in the low 150 range. Previous Cardiac MRI showed ejection fraction 34 %    ICD placed  by Dr. Lovena Le.  On previous office visits, she reported having malaise and fatigue, tachycardia, weight gain, leg swelling . She was previously taking high doses of diuretics for weight gain causing renal dysfunction, dehydration. For creatinine 1.9 we suggested she cut back on her diuretics  On followup today she only takes torsemide for weight over 162 pounds. Weight has been relatively stable. She is getting over viral gastroenteritis from Christmas at which time she was dehydrated from nausea vomiting diarrhea and creatinine was 1.6 She has rare episodes of palpitations  Previously reported having abdominal discomfort, general malaise, poor sleep, periodic leg swelling. Numbness in her hands and feet  Previous echocardiogram shows improvement in her ejection fraction up to 50%. Challenging image quality. On my own review, it does appear at least 45, if not 50%.   Problems in the past on Lipitor with cramping. Started on pravastatin.  EKG shows normal sinus rhythm with rate 80 beats  per minute with nonspecific ST abnormality      Outpatient Encounter Prescriptions as of 05/26/2013  Medication Sig  . alendronate (FOSAMAX) 70 MG tablet Take 1 tablet (70 mg total) by mouth every 7 (seven) days. Take with a full glass of water on an empty stomach.  . ALPRAZolam (XANAX) 0.25 MG tablet TAKE 1 TABLET TWICE A DAY  . aspirin 81 MG EC tablet Take 81 mg by mouth at bedtime.   Marland Kitchen CALCIUM-VITAMIN D PO Take 1 capsule by mouth daily.  . carvedilol (COREG) 12.5 MG tablet TAKE 1/2 TABLET (6.25 MG) BY MOUTH TWICE A DAY  . Coenzyme Q10 (COQ-10) 150 MG CAPS Take 1 tablet by mouth daily.  Marland Kitchen dextromethorphan (DELSYM) 30 MG/5ML liquid Take 60 mg by mouth as needed. For cough/congestion.  . diclofenac sodium (VOLTAREN) 1 % GEL Apply 1 application topically 3 (three) times daily.  . metolazone (ZAROXOLYN) 2.5 MG tablet Take 2.5 mg by mouth as needed.  . midodrine (PROAMATINE) 2.5 MG tablet Take 1 tablet (2.5 mg total) by mouth 3 (three) times daily as needed.  Marland Kitchen omeprazole (PRILOSEC) 40 MG capsule Take 40 mg by mouth 2 (two) times daily.  . potassium chloride (K-DUR) 10 MEQ tablet Take 1-4 tablets (10-40 mEq total) by mouth daily as needed. 2mEq for every 10mg  toresmide  . pravastatin (PRAVACHOL) 80 MG tablet Take 1 tablet (80 mg total) by mouth every 3 (three) days.  Marland Kitchen spironolactone (ALDACTONE) 25 MG tablet TAKE 1 TABLET (25 MG TOTAL) BY MOUTH DAILY.  Marland Kitchen torsemide (DEMADEX) 10 MG tablet 1-4 tablets daily depending on weight (goal 162-163lbs)  .  vitamin C (ASCORBIC ACID) 500 MG tablet Take 500 mg by mouth 2 (two) times daily.   . vitamin E 400 UNIT capsule Take 400 Units by mouth daily.  Marland Kitchen zolpidem (AMBIEN) 5 MG tablet TAKE 1/2 TO 1 TABLET BY MOUTH AT BEDTIME AS NEEDED    Review of Systems  Constitutional: Positive for fatigue.  HENT: Negative.   Eyes: Negative.   Respiratory: Negative.   Cardiovascular: Negative.   Gastrointestinal: Positive for abdominal pain.  Endocrine: Negative.    Musculoskeletal: Positive for arthralgias, back pain, myalgias and neck stiffness.  Skin: Negative.   Allergic/Immunologic: Negative.   Neurological: Negative.   Hematological: Negative.   Psychiatric/Behavioral: Negative.   All other systems reviewed and are negative.    BP 100/68  Pulse 80  Ht 5' 5.5" (1.664 m)  Wt 163 lb (73.936 kg)  BMI 26.70 kg/m2  Physical Exam  Nursing note and vitals reviewed. Constitutional: She is oriented to person, place, and time. She appears well-developed and well-nourished.  HENT:  Head: Normocephalic.  Nose: Nose normal.  Mouth/Throat: Oropharynx is clear and moist.  Eyes: Conjunctivae are normal. Pupils are equal, round, and reactive to light.  Neck: Normal range of motion. Neck supple. No JVD present.  Cardiovascular: Normal rate, regular rhythm, S1 normal, S2 normal, normal heart sounds and intact distal pulses.  Exam reveals no gallop and no friction rub.   No murmur heard. Pulmonary/Chest: Effort normal and breath sounds normal. No respiratory distress. She has no wheezes. She has no rales. She exhibits no tenderness.  Abdominal: Soft. Bowel sounds are normal. She exhibits no distension. There is no tenderness.  Musculoskeletal: Normal range of motion. She exhibits no edema and no tenderness.  Lymphadenopathy:    She has no cervical adenopathy.  Neurological: She is alert and oriented to person, place, and time. Coordination normal.  Skin: Skin is warm and dry. No rash noted. No erythema.  Psychiatric: She has a normal mood and affect. Her behavior is normal. Judgment and thought content normal.    Assessment and Plan

## 2013-05-26 NOTE — Assessment & Plan Note (Signed)
Suggested she avoid dehydration as this would make her more orthostatic. She does take midodrine occasionally for systolic pressure less than 100. I suggested in these cases that she let her weight climb up to 163, 164 pounds and avoid diuretics while hypotensive

## 2013-05-28 ENCOUNTER — Encounter: Payer: Self-pay | Admitting: *Deleted

## 2013-05-30 ENCOUNTER — Other Ambulatory Visit: Payer: Self-pay | Admitting: Family Medicine

## 2013-05-30 ENCOUNTER — Other Ambulatory Visit (INDEPENDENT_AMBULATORY_CARE_PROVIDER_SITE_OTHER): Payer: Medicare Other

## 2013-05-30 DIAGNOSIS — D72821 Monocytosis (symptomatic): Secondary | ICD-10-CM

## 2013-05-30 LAB — CBC WITH DIFFERENTIAL/PLATELET
Basophils Absolute: 0.1 K/uL (ref 0.0–0.1)
Basophils Relative: 1 % (ref 0.0–3.0)
Eosinophils Absolute: 0.1 K/uL (ref 0.0–0.7)
Eosinophils Relative: 0.9 % (ref 0.0–5.0)
HCT: 37.8 % (ref 36.0–46.0)
Hemoglobin: 12.6 g/dL (ref 12.0–15.0)
Lymphocytes Relative: 24.9 % (ref 12.0–46.0)
Lymphs Abs: 2.3 K/uL (ref 0.7–4.0)
MCHC: 33.3 g/dL (ref 30.0–36.0)
MCV: 88.5 fl (ref 78.0–100.0)
Monocytes Absolute: 0.9 K/uL (ref 0.1–1.0)
Monocytes Relative: 9.2 % (ref 3.0–12.0)
Neutro Abs: 6 K/uL (ref 1.4–7.7)
Neutrophils Relative %: 64 % (ref 43.0–77.0)
Platelets: 264 K/uL (ref 150.0–400.0)
RBC: 4.27 Mil/uL (ref 3.87–5.11)
RDW: 13 % (ref 11.5–14.6)
WBC: 9.4 K/uL (ref 4.5–10.5)

## 2013-06-02 DIAGNOSIS — M5137 Other intervertebral disc degeneration, lumbosacral region: Secondary | ICD-10-CM | POA: Diagnosis not present

## 2013-06-02 DIAGNOSIS — M999 Biomechanical lesion, unspecified: Secondary | ICD-10-CM | POA: Diagnosis not present

## 2013-06-02 DIAGNOSIS — IMO0002 Reserved for concepts with insufficient information to code with codable children: Secondary | ICD-10-CM | POA: Diagnosis not present

## 2013-06-04 ENCOUNTER — Encounter: Payer: Self-pay | Admitting: Internal Medicine

## 2013-06-08 ENCOUNTER — Other Ambulatory Visit: Payer: Self-pay | Admitting: Family Medicine

## 2013-06-08 NOTE — Telephone Encounter (Signed)
plz phone in. 

## 2013-06-09 NOTE — Telephone Encounter (Signed)
Rx called in as directed.   

## 2013-06-13 DIAGNOSIS — M810 Age-related osteoporosis without current pathological fracture: Secondary | ICD-10-CM | POA: Diagnosis not present

## 2013-06-16 DIAGNOSIS — IMO0002 Reserved for concepts with insufficient information to code with codable children: Secondary | ICD-10-CM | POA: Diagnosis not present

## 2013-06-16 DIAGNOSIS — M5137 Other intervertebral disc degeneration, lumbosacral region: Secondary | ICD-10-CM | POA: Diagnosis not present

## 2013-06-16 DIAGNOSIS — M999 Biomechanical lesion, unspecified: Secondary | ICD-10-CM | POA: Diagnosis not present

## 2013-06-18 DIAGNOSIS — L820 Inflamed seborrheic keratosis: Secondary | ICD-10-CM | POA: Diagnosis not present

## 2013-06-23 DIAGNOSIS — M5137 Other intervertebral disc degeneration, lumbosacral region: Secondary | ICD-10-CM | POA: Diagnosis not present

## 2013-06-23 DIAGNOSIS — M999 Biomechanical lesion, unspecified: Secondary | ICD-10-CM | POA: Diagnosis not present

## 2013-06-23 DIAGNOSIS — IMO0002 Reserved for concepts with insufficient information to code with codable children: Secondary | ICD-10-CM | POA: Diagnosis not present

## 2013-06-25 DIAGNOSIS — H04129 Dry eye syndrome of unspecified lacrimal gland: Secondary | ICD-10-CM | POA: Diagnosis not present

## 2013-06-30 DIAGNOSIS — M999 Biomechanical lesion, unspecified: Secondary | ICD-10-CM | POA: Diagnosis not present

## 2013-06-30 DIAGNOSIS — IMO0002 Reserved for concepts with insufficient information to code with codable children: Secondary | ICD-10-CM | POA: Diagnosis not present

## 2013-06-30 DIAGNOSIS — M5137 Other intervertebral disc degeneration, lumbosacral region: Secondary | ICD-10-CM | POA: Diagnosis not present

## 2013-07-04 DIAGNOSIS — J3489 Other specified disorders of nose and nasal sinuses: Secondary | ICD-10-CM | POA: Diagnosis not present

## 2013-07-04 DIAGNOSIS — J0110 Acute frontal sinusitis, unspecified: Secondary | ICD-10-CM | POA: Diagnosis not present

## 2013-07-06 ENCOUNTER — Other Ambulatory Visit: Payer: Self-pay | Admitting: Cardiovascular Disease

## 2013-07-07 DIAGNOSIS — M5137 Other intervertebral disc degeneration, lumbosacral region: Secondary | ICD-10-CM | POA: Diagnosis not present

## 2013-07-07 DIAGNOSIS — M999 Biomechanical lesion, unspecified: Secondary | ICD-10-CM | POA: Diagnosis not present

## 2013-07-07 DIAGNOSIS — IMO0002 Reserved for concepts with insufficient information to code with codable children: Secondary | ICD-10-CM | POA: Diagnosis not present

## 2013-07-14 DIAGNOSIS — M999 Biomechanical lesion, unspecified: Secondary | ICD-10-CM | POA: Diagnosis not present

## 2013-07-14 DIAGNOSIS — M5137 Other intervertebral disc degeneration, lumbosacral region: Secondary | ICD-10-CM | POA: Diagnosis not present

## 2013-07-14 DIAGNOSIS — IMO0002 Reserved for concepts with insufficient information to code with codable children: Secondary | ICD-10-CM | POA: Diagnosis not present

## 2013-07-21 DIAGNOSIS — IMO0002 Reserved for concepts with insufficient information to code with codable children: Secondary | ICD-10-CM | POA: Diagnosis not present

## 2013-07-21 DIAGNOSIS — M5137 Other intervertebral disc degeneration, lumbosacral region: Secondary | ICD-10-CM | POA: Diagnosis not present

## 2013-07-21 DIAGNOSIS — M999 Biomechanical lesion, unspecified: Secondary | ICD-10-CM | POA: Diagnosis not present

## 2013-07-28 DIAGNOSIS — M999 Biomechanical lesion, unspecified: Secondary | ICD-10-CM | POA: Diagnosis not present

## 2013-07-28 DIAGNOSIS — M5137 Other intervertebral disc degeneration, lumbosacral region: Secondary | ICD-10-CM | POA: Diagnosis not present

## 2013-07-28 DIAGNOSIS — IMO0002 Reserved for concepts with insufficient information to code with codable children: Secondary | ICD-10-CM | POA: Diagnosis not present

## 2013-08-04 DIAGNOSIS — M5137 Other intervertebral disc degeneration, lumbosacral region: Secondary | ICD-10-CM | POA: Diagnosis not present

## 2013-08-04 DIAGNOSIS — M999 Biomechanical lesion, unspecified: Secondary | ICD-10-CM | POA: Diagnosis not present

## 2013-08-04 DIAGNOSIS — IMO0002 Reserved for concepts with insufficient information to code with codable children: Secondary | ICD-10-CM | POA: Diagnosis not present

## 2013-08-06 ENCOUNTER — Ambulatory Visit (INDEPENDENT_AMBULATORY_CARE_PROVIDER_SITE_OTHER): Payer: Medicare Other | Admitting: *Deleted

## 2013-08-06 DIAGNOSIS — I428 Other cardiomyopathies: Secondary | ICD-10-CM | POA: Diagnosis not present

## 2013-08-06 DIAGNOSIS — I499 Cardiac arrhythmia, unspecified: Secondary | ICD-10-CM | POA: Diagnosis not present

## 2013-08-06 DIAGNOSIS — I509 Heart failure, unspecified: Secondary | ICD-10-CM | POA: Diagnosis not present

## 2013-08-06 DIAGNOSIS — I420 Dilated cardiomyopathy: Secondary | ICD-10-CM

## 2013-08-06 DIAGNOSIS — I5022 Chronic systolic (congestive) heart failure: Secondary | ICD-10-CM

## 2013-08-06 LAB — MDC_IDC_ENUM_SESS_TYPE_REMOTE
Battery Voltage: 3.14 V
Brady Statistic RV Percent Paced: 0.1 % — CL
HighPow Impedance: 70 Ohm
Lead Channel Impedance Value: 532 Ohm
Lead Channel Pacing Threshold Amplitude: 0.5 V
Lead Channel Pacing Threshold Pulse Width: 0.4 ms
Lead Channel Sensing Intrinsic Amplitude: 8.4 mV
Lead Channel Setting Pacing Amplitude: 2.5 V
Lead Channel Setting Pacing Pulse Width: 0.4 ms
Lead Channel Setting Sensing Sensitivity: 0.3 mV
Zone Setting Detection Interval: 300 ms
Zone Setting Detection Interval: 360 ms
Zone Setting Detection Interval: 450 ms

## 2013-08-11 DIAGNOSIS — M999 Biomechanical lesion, unspecified: Secondary | ICD-10-CM | POA: Diagnosis not present

## 2013-08-11 DIAGNOSIS — IMO0002 Reserved for concepts with insufficient information to code with codable children: Secondary | ICD-10-CM | POA: Diagnosis not present

## 2013-08-11 DIAGNOSIS — M5137 Other intervertebral disc degeneration, lumbosacral region: Secondary | ICD-10-CM | POA: Diagnosis not present

## 2013-08-13 ENCOUNTER — Encounter: Payer: Self-pay | Admitting: *Deleted

## 2013-08-18 DIAGNOSIS — IMO0002 Reserved for concepts with insufficient information to code with codable children: Secondary | ICD-10-CM | POA: Diagnosis not present

## 2013-08-18 DIAGNOSIS — M5137 Other intervertebral disc degeneration, lumbosacral region: Secondary | ICD-10-CM | POA: Diagnosis not present

## 2013-08-18 DIAGNOSIS — M999 Biomechanical lesion, unspecified: Secondary | ICD-10-CM | POA: Diagnosis not present

## 2013-08-20 ENCOUNTER — Ambulatory Visit (INDEPENDENT_AMBULATORY_CARE_PROVIDER_SITE_OTHER)
Admission: RE | Admit: 2013-08-20 | Discharge: 2013-08-20 | Disposition: A | Discharge status: Home / Self Care | Payer: Medicare Other | Source: Ambulatory Visit | Attending: Family Medicine | Admitting: Family Medicine

## 2013-08-20 ENCOUNTER — Ambulatory Visit (INDEPENDENT_AMBULATORY_CARE_PROVIDER_SITE_OTHER): Payer: Medicare Other | Admitting: Family Medicine

## 2013-08-20 ENCOUNTER — Encounter: Payer: Self-pay | Admitting: Family Medicine

## 2013-08-20 VITALS — BP 110/68 | HR 80 | Temp 97.4°F | Wt 171.0 lb

## 2013-08-20 DIAGNOSIS — R0609 Other forms of dyspnea: Secondary | ICD-10-CM | POA: Diagnosis not present

## 2013-08-20 DIAGNOSIS — M7730 Calcaneal spur, unspecified foot: Secondary | ICD-10-CM | POA: Diagnosis not present

## 2013-08-20 DIAGNOSIS — M79609 Pain in unspecified limb: Secondary | ICD-10-CM

## 2013-08-20 DIAGNOSIS — M79672 Pain in left foot: Secondary | ICD-10-CM

## 2013-08-20 DIAGNOSIS — R0989 Other specified symptoms and signs involving the circulatory and respiratory systems: Secondary | ICD-10-CM

## 2013-08-20 DIAGNOSIS — R0600 Dyspnea, unspecified: Secondary | ICD-10-CM

## 2013-08-20 MED ORDER — DEXLANSOPRAZOLE 60 MG PO CPDR: 60.0000 mg | DELAYED_RELEASE_CAPSULE | Freq: Every day | ORAL | Status: DC

## 2013-08-20 NOTE — Patient Instructions (Signed)
Xray of left foot today, we will call you with results. For shortness of breath and stomach pain, try dexilant 60mg  once daily instead of omeprazole twice daily. Good to see you today, call us with questions.

## 2013-08-20 NOTE — Progress Notes (Signed)
BP 110/68  Pulse 80  Temp(Src) 97.4 F (36.3 C) (Oral)  Wt 171 lb (77.565 kg)  SpO2 97%   CC: dyspnea with bending over, foot pain  Subjective:    Patient ID: Ashley [name], female    DOB: 1945-11-27, 68 y.o.   MRN: [id number]  HPI: Ashley [name] is a 68 y.o. female presenting on 08/20/2013 for Shortness of Breath and Foot Pain   Noticing fullness feeling in epigastric especially when she bends to tie shoes.  Ongoing for months.  Compliant with omeprazole 40mg  bid for GERD.  H/o IBS but no recent stool changes.  No vomiting or nausea.  No dysphagia or early satiety.    Foot pain - bilaterally, worse on left side over last 1-2 months.  Finds some bruising of left foot. Has ben told has cyst in toes.  Some bruising noted especially right 2nd toe over last 3 days.  Denies recent trauma/injury to left foot. 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 H/o bunion surgery on right foot (~2010).  Relevant past medical, surgical, family and social history reviewed and updated as indicated.  Allergies and medications reviewed and updated. Current Outpatient Prescriptions on File Prior to Visit  Medication Sig  . alendronate (FOSAMAX) 70 MG tablet Take 1 tablet (70 mg total) by mouth every 7 (seven) days. Take with a full glass of water on an empty stomach.  . ALPRAZolam (XANAX) 0.25 MG tablet TAKE 1 TABLET TWICE A DAY  . aspirin 81 MG EC tablet Take 81 mg by mouth at bedtime.   Marland Kitchen CALCIUM-VITAMIN D PO Take 1 capsule by mouth daily.  . carvedilol (COREG) 12.5 MG tablet TAKE 1/2 TABLET (6.25 MG) BY MOUTH TWICE A DAY  . Coenzyme Q10 (COQ-10) 150 MG CAPS Take 1 tablet by mouth daily.  Marland Kitchen dextromethorphan (DELSYM) 30 MG/5ML liquid Take 60 mg by mouth as needed. For cough/congestion.  . diclofenac sodium (VOLTAREN) 1 % GEL Apply 1 application topically 3 (three) times daily.  . metolazone (ZAROXOLYN) 2.5 MG tablet Take 2.5 mg by mouth as needed.  .  midodrine (PROAMATINE) 2.5 MG tablet Take 1 tablet (2.5 mg total) by mouth 3 (three) times daily as needed.  Marland Kitchen omeprazole (PRILOSEC) 40 MG capsule Take 40 mg by mouth 2 (two) times daily.  . potassium chloride (K-DUR) 10 MEQ tablet TAKE 1-4 TABLETS (10-40 MEQ TOTAL) BY MOUTH DAILY AS NEEDED. 10MEQ FOR EVERY 10MG  TORESMIDE  . pravastatin (PRAVACHOL) 80 MG tablet Take 1 tablet (80 mg total) by mouth every 3 (three) days.  Marland Kitchen spironolactone (ALDACTONE) 25 MG tablet TAKE 1 TABLET (25 MG TOTAL) BY MOUTH DAILY.  Marland Kitchen torsemide (DEMADEX) 10 MG tablet 1-4 tablets daily depending on weight (goal 162-163lbs)  . torsemide (DEMADEX) 10 MG tablet TAKE 2 TABLETS BY MOUTH TWICE A DAY  . vitamin C (ASCORBIC ACID) 500 MG tablet Take 500 mg by mouth 2 (two) times daily.   . vitamin E 400 UNIT capsule Take 400 Units by mouth daily.  Marland Kitchen zolpidem (AMBIEN) 5 MG tablet TAKE 1/2 TO 1 TABLET AT BEDTIME AS NEEDED  . [DISCONTINUED] bisoprolol (ZEBETA) 5 MG tablet Take 5 mg by mouth daily.   No current facility-administered medications on file prior to visit.    Review of Systems Per HPI unless specifically indicated above    Objective:    BP 110/68  Pulse 80  Temp(Src) 97.4 F (36.3 C) (Oral)  Wt 171  lb (77.565 kg)  SpO2 97%  Physical Exam  Nursing note and vitals reviewed. Constitutional: She is oriented to person, place, and time. She appears well-developed and well-nourished. No distress.  HENT:  Mouth/Throat: Oropharynx is clear and moist. No oropharyngeal exudate.  Cardiovascular: Normal rate, regular rhythm, normal heart sounds and intact distal pulses.   No murmur heard. Pulmonary/Chest: Effort normal and breath sounds normal. No respiratory distress. She has no wheezes. She has no rales.  Abdominal: Soft. Bowel sounds are normal. She exhibits no distension and no mass. There is tenderness (mild diffuse but predominantly epigastric and LUQ). There is no rebound and no guarding.  Musculoskeletal: She  exhibits no edema.  2+ DP bilaterally Tender to palpation at L 2nd and 3rd MTP and MT shaft, no pain with axial loading at toes. 4th toe shorter 2/2 h/o IP removal due to gangrene ~2000 No breaks in skin  Neurological: She is alert and oriented to person, place, and time. No sensory deficit. Gait normal.  sensation intact  Skin: Skin is warm and dry. No rash noted.       Assessment & Plan:   Problem List Items Addressed This Visit   Left foot pain - Primary     Check Xray to r/o stress fracture - clear, no fracture. Will recommend return to podiatry to discuss benefits of orthotics to improve foot mechanics. Also avoid heels and ensure she uses supportive wide toed shoes.    Relevant Orders      DG Foot Complete Left (Completed)   Dyspnea     Isolated to when she bends over to tie shoes, not otherwise, not significantly with exertion.  This is not consistent with respiratory or cardiac etiology.  Reviewed latest echo - showing improvement in EF to normal range (50-55%).  No other signs of fluid overload on exam today. Anticipate GI related discomfort possibly manifesting as dyspnea.  She has been on omeprazole 40mg  bid for last few years, but has persistent epigastric discomfort with occasional nausea.  She cannot tolerate antihistamines.  I suggested trial of different PPI and have prescribed dexilant to take 60mg  once daily in am.  If sxs not improved with this, suggested return to GI for further evaluation.  Pt agrees with plan.        Follow up plan: Return if symptoms worsen or fail to improve.

## 2013-08-20 NOTE — Progress Notes (Signed)
Pre visit review using our clinic review tool, if applicable. No additional management support is needed unless otherwise documented below in the visit note. 

## 2013-08-21 ENCOUNTER — Encounter: Payer: Self-pay | Admitting: Family Medicine

## 2013-08-21 ENCOUNTER — Encounter: Payer: Self-pay | Admitting: Internal Medicine

## 2013-08-21 DIAGNOSIS — R0600 Dyspnea, unspecified: Secondary | ICD-10-CM | POA: Insufficient documentation

## 2013-08-21 NOTE — Assessment & Plan Note (Signed)
Check Xray to r/o stress fracture - clear, no fracture. Will recommend return to podiatry to discuss benefits of orthotics to improve foot mechanics. Also avoid heels and ensure she uses supportive wide toed shoes.

## 2013-08-21 NOTE — Assessment & Plan Note (Signed)
Isolated to when she bends over to tie shoes, not otherwise, not significantly with exertion.  This is not consistent with respiratory or cardiac etiology.  Reviewed latest echo - showing improvement in EF to normal range (50-55%).  No other signs of fluid overload on exam today. Anticipate GI related discomfort possibly manifesting as dyspnea.  She has been on omeprazole 40mg  bid for last few years, but has persistent epigastric discomfort with occasional nausea.  She cannot tolerate antihistamines.  I suggested trial of different PPI and have prescribed dexilant to take 60mg  once daily in am.  If sxs not improved with this, suggested return to GI for further evaluation.  Pt agrees with plan.

## 2013-08-25 DIAGNOSIS — M5137 Other intervertebral disc degeneration, lumbosacral region: Secondary | ICD-10-CM | POA: Diagnosis not present

## 2013-08-25 DIAGNOSIS — M999 Biomechanical lesion, unspecified: Secondary | ICD-10-CM | POA: Diagnosis not present

## 2013-08-25 DIAGNOSIS — IMO0002 Reserved for concepts with insufficient information to code with codable children: Secondary | ICD-10-CM | POA: Diagnosis not present

## 2013-08-26 ENCOUNTER — Other Ambulatory Visit: Payer: Self-pay | Admitting: *Deleted

## 2013-08-26 MED ORDER — POTASSIUM CHLORIDE ER 10 MEQ PO TBCR: EXTENDED_RELEASE_TABLET | ORAL | Status: DC

## 2013-09-08 DIAGNOSIS — IMO0002 Reserved for concepts with insufficient information to code with codable children: Secondary | ICD-10-CM | POA: Diagnosis not present

## 2013-09-08 DIAGNOSIS — M5137 Other intervertebral disc degeneration, lumbosacral region: Secondary | ICD-10-CM | POA: Diagnosis not present

## 2013-09-08 DIAGNOSIS — M999 Biomechanical lesion, unspecified: Secondary | ICD-10-CM | POA: Diagnosis not present

## 2013-09-10 DIAGNOSIS — M1710 Unilateral primary osteoarthritis, unspecified knee: Secondary | ICD-10-CM | POA: Diagnosis not present

## 2013-09-10 DIAGNOSIS — IMO0002 Reserved for concepts with insufficient information to code with codable children: Secondary | ICD-10-CM | POA: Diagnosis not present

## 2013-09-13 DIAGNOSIS — N289 Disorder of kidney and ureter, unspecified: Secondary | ICD-10-CM | POA: Diagnosis present

## 2013-09-13 DIAGNOSIS — R109 Unspecified abdominal pain: Secondary | ICD-10-CM | POA: Diagnosis not present

## 2013-09-13 DIAGNOSIS — K5900 Constipation, unspecified: Secondary | ICD-10-CM | POA: Diagnosis not present

## 2013-09-13 DIAGNOSIS — Z950 Presence of cardiac pacemaker: Secondary | ICD-10-CM | POA: Diagnosis not present

## 2013-09-13 DIAGNOSIS — Z8701 Personal history of pneumonia (recurrent): Secondary | ICD-10-CM | POA: Diagnosis not present

## 2013-09-13 DIAGNOSIS — K5732 Diverticulitis of large intestine without perforation or abscess without bleeding: Secondary | ICD-10-CM | POA: Diagnosis not present

## 2013-09-13 DIAGNOSIS — K219 Gastro-esophageal reflux disease without esophagitis: Secondary | ICD-10-CM | POA: Diagnosis present

## 2013-09-13 DIAGNOSIS — E785 Hyperlipidemia, unspecified: Secondary | ICD-10-CM | POA: Diagnosis present

## 2013-09-13 DIAGNOSIS — R1032 Left lower quadrant pain: Secondary | ICD-10-CM | POA: Diagnosis not present

## 2013-09-13 DIAGNOSIS — I509 Heart failure, unspecified: Secondary | ICD-10-CM | POA: Diagnosis present

## 2013-09-14 DIAGNOSIS — Z801 Family history of malignant neoplasm of trachea, bronchus and lung: Secondary | ICD-10-CM | POA: Diagnosis not present

## 2013-09-14 DIAGNOSIS — R42 Dizziness and giddiness: Secondary | ICD-10-CM | POA: Diagnosis not present

## 2013-09-14 DIAGNOSIS — N731 Chronic parametritis and pelvic cellulitis: Secondary | ICD-10-CM | POA: Diagnosis not present

## 2013-09-14 DIAGNOSIS — C439 Malignant melanoma of skin, unspecified: Secondary | ICD-10-CM | POA: Diagnosis not present

## 2013-09-14 DIAGNOSIS — I509 Heart failure, unspecified: Secondary | ICD-10-CM | POA: Diagnosis present

## 2013-09-14 DIAGNOSIS — K2970 Gastritis, unspecified, without bleeding: Secondary | ICD-10-CM | POA: Diagnosis not present

## 2013-09-14 DIAGNOSIS — K2990 Gastroduodenitis, unspecified, without bleeding: Secondary | ICD-10-CM | POA: Diagnosis not present

## 2013-09-14 DIAGNOSIS — R10819 Abdominal tenderness, unspecified site: Secondary | ICD-10-CM | POA: Diagnosis not present

## 2013-09-14 DIAGNOSIS — D72829 Elevated white blood cell count, unspecified: Secondary | ICD-10-CM | POA: Diagnosis not present

## 2013-09-14 DIAGNOSIS — K5733 Diverticulitis of large intestine without perforation or abscess with bleeding: Secondary | ICD-10-CM | POA: Diagnosis not present

## 2013-09-14 DIAGNOSIS — R110 Nausea: Secondary | ICD-10-CM | POA: Diagnosis present

## 2013-09-14 DIAGNOSIS — R269 Unspecified abnormalities of gait and mobility: Secondary | ICD-10-CM | POA: Diagnosis not present

## 2013-09-14 DIAGNOSIS — M810 Age-related osteoporosis without current pathological fracture: Secondary | ICD-10-CM | POA: Diagnosis not present

## 2013-09-14 DIAGNOSIS — Z452 Encounter for adjustment and management of vascular access device: Secondary | ICD-10-CM | POA: Diagnosis not present

## 2013-09-14 DIAGNOSIS — Z882 Allergy status to sulfonamides status: Secondary | ICD-10-CM | POA: Diagnosis not present

## 2013-09-14 DIAGNOSIS — R12 Heartburn: Secondary | ICD-10-CM | POA: Diagnosis present

## 2013-09-14 DIAGNOSIS — R5381 Other malaise: Secondary | ICD-10-CM | POA: Diagnosis not present

## 2013-09-14 DIAGNOSIS — K5732 Diverticulitis of large intestine without perforation or abscess without bleeding: Secondary | ICD-10-CM | POA: Diagnosis not present

## 2013-09-14 DIAGNOSIS — I5030 Unspecified diastolic (congestive) heart failure: Secondary | ICD-10-CM | POA: Diagnosis not present

## 2013-09-14 DIAGNOSIS — Z792 Long term (current) use of antibiotics: Secondary | ICD-10-CM | POA: Diagnosis not present

## 2013-09-14 DIAGNOSIS — K630 Abscess of intestine: Secondary | ICD-10-CM | POA: Diagnosis not present

## 2013-09-14 DIAGNOSIS — I10 Essential (primary) hypertension: Secondary | ICD-10-CM | POA: Diagnosis present

## 2013-09-17 ENCOUNTER — Telehealth: Payer: Self-pay | Admitting: *Deleted

## 2013-09-17 NOTE — Telephone Encounter (Signed)
Late entry- Message from CAN in regards to abd pain over the past weekend while patient was out of town. (no record in chart-only a hard copy fax received in the office). CAN advised patient to go to ER.  Left message today for patient to return my call.

## 2013-09-17 NOTE — Telephone Encounter (Signed)
Noted  

## 2013-09-17 NOTE — Telephone Encounter (Signed)
Spoke with patient-she said she ended up at [facility] and had to have surgery for ruptured diverticuli and infection. She is still in the hospital now and won't be discharged until probably next week and will probably have to go into rehab after discharge. She said they will transport her back here for rehab via ambulance.

## 2013-09-19 ENCOUNTER — Telehealth: Payer: Self-pay | Admitting: *Deleted

## 2013-09-19 NOTE — Telephone Encounter (Signed)
Patient called and said she will hopefully be discharged tomorrow and will not be going to rehab but will be coming home via ambulance. She said the doctor there told her the drains needed to stay in and will need to be removed by someone here when she gets home. I scheduled her a follow up for next week and will request records on Monday when they are available.

## 2013-09-19 NOTE — Telephone Encounter (Signed)
Ok. The schedule is still blocked. I will get it opened and reschedule her.

## 2013-09-19 NOTE — Telephone Encounter (Signed)
Patient notified and appt rescheduled.

## 2013-09-19 NOTE — Telephone Encounter (Signed)
Noted. Will refer to surgery when I see her next week. Can we schedule sooner appointment with me - for Tuesday at 8 or 8:30am.  I don't have a meeting that morning.

## 2013-09-23 ENCOUNTER — Ambulatory Visit: Payer: Medicare Other | Admitting: Family Medicine

## 2013-09-24 ENCOUNTER — Telehealth: Payer: Self-pay | Admitting: Family Medicine

## 2013-09-24 DIAGNOSIS — K5780 Diverticulitis of intestine, part unspecified, with perforation and abscess without bleeding: Secondary | ICD-10-CM

## 2013-09-24 NOTE — Telephone Encounter (Signed)
[facility] and spoke with Emmit Pomfret re: Home Health IV antibiotics that the patient will need set up before she can get discharged home. [place] will be able to accept the patient but needs the order for [facility] faxed to them and they will also need to know if there is going to be a Picc line . Also she said that they need to make sure there is a teachable caregiver who will be able to give the medicine at least once a day or maybe both times as Medicare will not cover [facility] for 7 to 10 days. Sharrie Rothman will call Mr Costanza to discuss him helping his wife when she comes home. We are waiting for medical records on the patient to also fax to [facility]. Their Fax [phone], Ralene Muskrat.

## 2013-09-24 NOTE — Telephone Encounter (Signed)
Ashley [name] plz check with me - i have some records from hospitalization but not all. Agree reasonable for PICC line placement and home iv abx therapy for 10 days. Ok to give verbal order for [facility] IV nurse and IV home abx per hosp records. We had appt scheduled for tomorrow at 2:15pm, may need to reschedule depending on pt's discharge date.

## 2013-09-24 NOTE — Telephone Encounter (Signed)
G- please put in the referral/order for [facility] or let me know if you want me to do it since I talked with surgery.  Also, I would like to bundle the calls to surgery. The next outgoing call to surgery should request the PICC placement at the hospital in [place].  Let me know if I can be of service otherwise.  Thanks to all.

## 2013-09-24 NOTE — Telephone Encounter (Signed)
Call from Dr. Kennon Rounds, surgeon in [place].  Cell [phone].   Pt admitted with diverticulitis.  Now with need for IV meropenem q12 hours for ~7-10 more days.  Would be okay for discharge if [facility] can be set up here to finish off her abx.  Patient would likely drive home after AM dose of abx at hospital and see [facility] at her home later that day.   Would need to get 2nd dose of medicine on the day of discharge.   She may need a PICC line- if needed, this can be done in [place] before discharge.  Dr. Darnell Level- please offer your input on this.  I think it would be reasonable.  She likely would need f/u here soon after discharge.   I gave Dr. Kennon Rounds our fax number.  I talked with Rosaria Ferries about this.  Routed to both [place] and PCP for input.

## 2013-09-25 ENCOUNTER — Telehealth (INDEPENDENT_AMBULATORY_CARE_PROVIDER_SITE_OTHER): Payer: Self-pay

## 2013-09-25 ENCOUNTER — Ambulatory Visit: Payer: Medicare Other | Admitting: Family Medicine

## 2013-09-25 DIAGNOSIS — Z8719 Personal history of other diseases of the digestive system: Secondary | ICD-10-CM | POA: Insufficient documentation

## 2013-09-25 NOTE — Telephone Encounter (Signed)
Dr Danise Mina referred this pt, she was out of town and had to have surgery. Pt had lap with placement of JP drains medial and lateral to the sigmoid colon. Pt is being discharged from [facility] on 09/26/13. Dr Johney Maine has agreed to see this pt. Made the appt for 10/10/13. Rosaria Ferries will inform the pt of this appt that we have scheduled.  Pt is seeing Dr Lamarr Lulas on 09/29/13, they will call us back after this appt  If they feel that the pt needs to be seen before 10/10/13 and they will fax over the discharge papers from the hospital.

## 2013-09-25 NOTE — Telephone Encounter (Signed)
IV PICC line med orders provided to Ashley [name], Pharmacist for [facility].

## 2013-09-25 NOTE — Telephone Encounter (Signed)
Spoke with Dr. Kennon Rounds, surgeon in [place]. Cell [phone].  [place] for [employer]. HH referral placed.  Pt will get PICC placed today and will be discharged from hospital tomorrow.  plz set up home health for tomorrow afternoon at her house.  plz schedule appt with me on Monday and appt with surgery for later in the week.

## 2013-09-25 NOTE — Telephone Encounter (Signed)
[facility] has been lined up for tomm 09/26/13 when pt gets home. Called [facility] to set up Surgical consult and they requested records be faxed before they will accept the patient. Faxed over some records today and will send discharge when we receive it.

## 2013-09-25 NOTE — Telephone Encounter (Signed)
Follow up scheduled with Dr. Darnell Level

## 2013-09-26 ENCOUNTER — Telehealth: Payer: Self-pay | Admitting: Family Medicine

## 2013-09-26 NOTE — Telephone Encounter (Signed)
Left message for surgeon Dr. Kennon Rounds to call me back. plz call to get more info from patient - if concern for rpt abscess I really think she needs to stay at that hospital.

## 2013-09-26 NOTE — Telephone Encounter (Signed)
Patient notified and verbalized understanding. 

## 2013-09-26 NOTE — Telephone Encounter (Signed)
Message left for patient's husband. Spoke with patient. She said that the doctor at the hospital told her that based on her white count "it was not far-fetched to assume that she had another abscess" and that she needed you to order her a CT when she got home. I advised her that if he thought that, then he should've gotten that before she left and she said he felt it was okay to wait until she got home. I told her that since you hadn't even seen her and that we were closed over the weekend, then she should've gotten it before she left to be safe and she said the doctor there didn't think it was that necessary and wasn't 100% sure that she had one but that it wasn't "far-fetched" to think it. I told her it could cause her major problems over the weekend and cause her to have to go emergently to the hospital. I advised that you were awaiting a call from the doctor there and we would see what his thoughts were and call her with a plan. She verbalized understanding.

## 2013-09-26 NOTE — Telephone Encounter (Signed)
Spoke with Psychologist, sport and exercise. Will evaluate pt and order stat blood work when she comes to appt on Monday, if needed will order CT scan at that time. If feeling worse in interim - worsening abd pain, fever >101, other concerns, recommend seek care at ER.

## 2013-09-26 NOTE — Telephone Encounter (Signed)
Anne Ng from [employer] (she is setting up home IV abx for pt) called to relay message from pt's husband that he is requesting order for CT scan from Dr. Danise Mina.  Spoke with husband.  He said that pt is being d/c'd today from [facility] in [place] and that Dr. Kathleen Argue partner came by and said that he recommended a follow up CT scan because they think pt may have another abscess (WBC count up today).  I asked him why they were not doing CT before she left the hospital today and he said they wanted to get back home.  She has a hosp follow up scheduled with Dr. Danise Mina on 09/29/13.  Husband's cb is [phone].

## 2013-09-26 NOTE — Telephone Encounter (Signed)
plz call pt - surgeons at the hospital have to order CT scan if they're concerned about abscess. We can discuss at f/u visit with me in office and may order then, but not over weekend. If sent home and feels needs CT scan or feeling worse recommend going to ER for evaluation.

## 2013-09-29 ENCOUNTER — Telehealth: Payer: Self-pay | Admitting: *Deleted

## 2013-09-29 ENCOUNTER — Encounter: Payer: Self-pay | Admitting: Family Medicine

## 2013-09-29 ENCOUNTER — Ambulatory Visit (INDEPENDENT_AMBULATORY_CARE_PROVIDER_SITE_OTHER): Payer: Medicare Other | Admitting: Family Medicine

## 2013-09-29 VITALS — BP 100/76 | HR 88 | Temp 97.9°F | Wt 156.8 lb

## 2013-09-29 DIAGNOSIS — I509 Heart failure, unspecified: Secondary | ICD-10-CM

## 2013-09-29 DIAGNOSIS — I5022 Chronic systolic (congestive) heart failure: Secondary | ICD-10-CM | POA: Diagnosis not present

## 2013-09-29 DIAGNOSIS — N183 Chronic kidney disease, stage 3 unspecified: Secondary | ICD-10-CM

## 2013-09-29 DIAGNOSIS — K630 Abscess of intestine: Secondary | ICD-10-CM

## 2013-09-29 DIAGNOSIS — K5780 Diverticulitis of intestine, part unspecified, with perforation and abscess without bleeding: Secondary | ICD-10-CM

## 2013-09-29 DIAGNOSIS — K5732 Diverticulitis of large intestine without perforation or abscess without bleeding: Secondary | ICD-10-CM

## 2013-09-29 LAB — COMPREHENSIVE METABOLIC PANEL
ALT: 21 U/L (ref 0–35)
AST: 16 U/L (ref 0–37)
Albumin: 3.6 g/dL (ref 3.5–5.2)
Alkaline Phosphatase: 191 U/L — ABNORMAL HIGH (ref 39–117)
BUN: 27 mg/dL — ABNORMAL HIGH (ref 6–23)
CO2: 26 mEq/L (ref 19–32)
Calcium: 10.2 mg/dL (ref 8.4–10.5)
Chloride: 94 mEq/L — ABNORMAL LOW (ref 96–112)
Creatinine, Ser: 1.1 mg/dL (ref 0.4–1.2)
GFR: 51.39 mL/min — ABNORMAL LOW (ref >60.00)
Glucose, Bld: 99 mg/dL (ref 70–99)
Potassium: 5.2 mEq/L — ABNORMAL HIGH (ref 3.5–5.1)
Sodium: 132 mEq/L — ABNORMAL LOW (ref 135–145)
Total Bilirubin: 0.3 mg/dL (ref 0.2–1.2)
Total Protein: 8.5 g/dL — ABNORMAL HIGH (ref 6.0–8.3)

## 2013-09-29 LAB — CBC WITH DIFFERENTIAL/PLATELET
Basophils Absolute: 0.2 10*3/uL — ABNORMAL HIGH (ref 0.0–0.1)
Basophils Relative: 1.2 % (ref 0.0–3.0)
Eosinophils Absolute: 0.2 10*3/uL (ref 0.0–0.7)
Eosinophils Relative: 1 % (ref 0.0–5.0)
HCT: 35 % — ABNORMAL LOW (ref 36.0–46.0)
Hemoglobin: 11.6 g/dL — ABNORMAL LOW (ref 12.0–15.0)
Lymphocytes Relative: 23.6 % (ref 12.0–46.0)
Lymphs Abs: 3.8 10*3/uL (ref 0.7–4.0)
MCHC: 33.1 g/dL (ref 30.0–36.0)
MCV: 88.2 fl (ref 78.0–100.0)
Monocytes Absolute: 1.2 10*3/uL — ABNORMAL HIGH (ref 0.1–1.0)
Monocytes Relative: 7.6 % (ref 3.0–12.0)
Neutro Abs: 10.8 10*3/uL — ABNORMAL HIGH (ref 1.4–7.7)
Neutrophils Relative %: 66.6 % (ref 43.0–77.0)
Platelets: 607 10*3/uL — ABNORMAL HIGH (ref 150.0–400.0)
RBC: 3.97 Mil/uL (ref 3.87–5.11)
RDW: 13.1 % (ref 11.5–15.5)
WBC: 16.2 10*3/uL — ABNORMAL HIGH (ref 4.0–10.5)

## 2013-09-29 MED ORDER — OXYCODONE HCL 5 MG PO CAPS: 5.0000 mg | ORAL_CAPSULE | Freq: Four times a day (QID) | ORAL | Status: DC | PRN

## 2013-09-29 NOTE — Patient Instructions (Signed)
Good to see you today! I think you are doing remarkably well for what you have recently been through. Blood work today and I will see if we can move surgery appointment forward. Return to see me as needed or in 1 mo for follow up. Continue home IV antibiotics. I've refilled oxycodone for you - continue 1/2 tablet as needed every 5-6 hours.

## 2013-09-29 NOTE — Telephone Encounter (Signed)
Patient called and said she was hospitalized in [place] and wishes to see Dr. [name]  Appt scheduled for 5/21

## 2013-09-29 NOTE — Progress Notes (Signed)
Pre visit review using our clinic review tool, if applicable. No additional management support is needed unless otherwise documented below in the visit note. 

## 2013-09-29 NOTE — Progress Notes (Addendum)
BP 100/76  Pulse 88  Temp(Src) 97.9 F (36.6 C) (Oral)  Wt 156 lb 12 oz (71.101 kg)   CC: hospital f/u  Subjective:    Patient ID: Ashley [name], female    DOB: 27-Jul-1945, 68 y.o.   MRN: [id number]  HPI: Ashley [name] is a 68 y.o. female presenting on 09/29/2013 for Follow-up   Pt presents today after complicated hospitalization at [facility] 5/2-15/2015 with acute perforated diverticulitis complicated by abscess formation necessitating laparoscopy on May 4th with placement of 2 JP drains (medial and lateral to sigmoid colon), then was found to have recurrent pelvic abscess and underwent CT guided pelvic abscess drainage with placement of another JP drain on 09/21/2013.  She was discharged on 09/26/2013 and has amedysis HH therapy coming out to her house for IV meropenem 1gm BID to complete 10 d course.  Prior to discharge her WBC had increased to 21 and there was concern for possible redevelopment of abscess but f/u CT was not obtained due to clinical picture.  I spoke with surgeon prior to pt's discharge.  All records reviewed.  All drains are out.  Wound cx returned ESBL Ecoli resistant to avelox, placed on IV meropenem via PICC. CT abd/pelvix from 5/9 showed severe sigmoid diverticulosis with persistent diverticulitis, along with concern for small fistula tracts.  She is scheduled to see local surgeons next Friday May 29th.  Will see if this appt can be moved forward Pt currently not on torsemide.  Prior dry weight was 162 lbs.  Feeling better each day.  Today walked up stairs - first time.  Has been having some sweats.  Appetite slowly picking up, but has lost 16 lbs over this illness. Denies fevers.   Wt Readings from Last 3 Encounters:  09/29/13 156 lb 12 oz (71.101 kg)  08/20/13 171 lb (77.565 kg)  05/26/13 163 lb (73.936 kg)    Relevant past medical, surgical, family and social history reviewed and updated as indicated.  Allergies and  medications reviewed and updated. Current Outpatient Prescriptions on File Prior to Visit  Medication Sig  . alendronate (FOSAMAX) 70 MG tablet Take 1 tablet (70 mg total) by mouth every 7 (seven) days. Take with a full glass of water on an empty stomach.  . ALPRAZolam (XANAX) 0.25 MG tablet TAKE 1 TABLET TWICE A DAY  . aspirin 81 MG EC tablet Take 81 mg by mouth at bedtime.   Marland Kitchen CALCIUM-VITAMIN D PO Take 1 capsule by mouth daily.  . carvedilol (COREG) 12.5 MG tablet TAKE 1/2 TABLET (6.25 MG) BY MOUTH TWICE A DAY  . Coenzyme Q10 (COQ-10) 150 MG CAPS Take 1 tablet by mouth daily.  Marland Kitchen dextromethorphan (DELSYM) 30 MG/5ML liquid Take 60 mg by mouth as needed. For cough/congestion.  . diclofenac sodium (VOLTAREN) 1 % GEL Apply 1 application topically 3 (three) times daily.  . metolazone (ZAROXOLYN) 2.5 MG tablet Take 2.5 mg by mouth as needed.  . midodrine (PROAMATINE) 2.5 MG tablet Take 1 tablet (2.5 mg total) by mouth 3 (three) times daily as needed.  . potassium chloride (K-DUR) 10 MEQ tablet TAKE 1-4 TABLETS (10-40 MEQ TOTAL) BY MOUTH DAILY AS NEEDED. 10MEQ FOR EVERY 10MG  TORESMIDE  . pravastatin (PRAVACHOL) 80 MG tablet Take 1 tablet (80 mg total) by mouth every 3 (three) days.  Marland Kitchen torsemide (DEMADEX) 10 MG tablet 1-4 tablets daily depending on weight (goal 162-163lbs)  . vitamin C (ASCORBIC ACID) 500 MG tablet Take 500 mg by  mouth 2 (two) times daily.   . vitamin E 400 UNIT capsule Take 400 Units by mouth daily.  Marland Kitchen zolpidem (AMBIEN) 5 MG tablet TAKE 1/2 TO 1 TABLET AT BEDTIME AS NEEDED  . dexlansoprazole (DEXILANT) 60 MG capsule Take 1 capsule (60 mg total) by mouth daily.  . [DISCONTINUED] bisoprolol (ZEBETA) 5 MG tablet Take 5 mg by mouth daily.   No current facility-administered medications on file prior to visit.    Review of Systems Per HPI unless specifically indicated above    Objective:    BP 100/76  Pulse 88  Temp(Src) 97.9 F (36.6 C) (Oral)  Wt 156 lb 12 oz (71.101 kg)    Physical Exam  Nursing note and vitals reviewed. Constitutional: She appears well-developed and well-nourished. No distress.  HENT:  Mouth/Throat: Oropharynx is clear and moist. No oropharyngeal exudate.  Neck: Normal range of motion. Neck supple.  Cardiovascular: Normal rate, regular rhythm, normal heart sounds and intact distal pulses.   No murmur heard. Pulmonary/Chest: Effort normal and breath sounds normal. No respiratory distress. She has no wheezes. She has no rales.  Abdominal: Soft. Bowel sounds are normal. She exhibits no distension and no mass. There is no hepatosplenomegaly. There is tenderness (moderate) in the right lower quadrant, suprapubic area and left lower quadrant. There is no rigidity, no rebound, no CVA tenderness and negative Murphy's sign.  Incisions well approximated, c/d/i Several ecchymoses present throughout abdomen.  Musculoskeletal: She exhibits no edema.  Lymphadenopathy:    She has no cervical adenopathy.  Skin: Skin is warm and dry.       Assessment & Plan:   Problem List Items Addressed This Visit   Chronic systolic CHF (congestive heart failure)     Off torsemide currently, remain off.  No evidence of fluid overload today Has lost 16 lbs recent hospitalization    Relevant Medications      spironolactone (ALDACTONE) 25 MG tablet   CKD (chronic kidney disease) stage 3, GFR 30-59 ml/min     Check Cr and K today.  CKD since 05/2011.    Diverticulitis of intestine with abscess - Primary     Recent hospitalization at [facility] for ruptured sigmoid diverticulitis complicated by abscess s/p drainage and currently on IV meropenem to complete 10d course. Prior to d/c WBC spiked to 24k, will recheck CBC today to further eval for possible recurrent abscess with low threshold to obtain f/u CT scan. Each day slowly recovering. Will schedule f/u with local surgeon as well. Pt agrees with plan  Oxycodone 5mg  IR refilled today #30.    Relevant  Medications      oxyCODONE-acetaminophen (PERCOCET/ROXICET) 5-325 MG per tablet      oxycodone (OXY-IR) 5 MG capsule   Other Relevant Orders      Comprehensive metabolic panel (Completed)      CBC with Differential (Completed)       Follow up plan: Return in about 1 month (around 10/30/2013), or if symptoms worsen or fail to improve, for follow up visit.

## 2013-09-30 ENCOUNTER — Encounter (HOSPITAL_COMMUNITY): Payer: Self-pay | Admitting: Emergency Medicine

## 2013-09-30 ENCOUNTER — Ambulatory Visit (INDEPENDENT_AMBULATORY_CARE_PROVIDER_SITE_OTHER)
Admission: RE | Admit: 2013-09-30 | Discharge: 2013-09-30 | Disposition: A | Discharge status: Home / Self Care | Payer: Medicare Other | Source: Ambulatory Visit | Attending: Family Medicine | Admitting: Family Medicine

## 2013-09-30 ENCOUNTER — Emergency Department (HOSPITAL_COMMUNITY): Payer: Medicare Other

## 2013-09-30 ENCOUNTER — Other Ambulatory Visit: Payer: Self-pay | Admitting: Family Medicine

## 2013-09-30 ENCOUNTER — Inpatient Hospital Stay (HOSPITAL_COMMUNITY)
Admission: EM | Admit: 2013-09-30 | Discharge: 2013-10-08 | DRG: 330 | Disposition: A | Discharge status: Home / Self Care | Payer: Medicare Other | Attending: Internal Medicine | Admitting: Internal Medicine

## 2013-09-30 ENCOUNTER — Telehealth: Payer: Self-pay

## 2013-09-30 DIAGNOSIS — R109 Unspecified abdominal pain: Secondary | ICD-10-CM | POA: Diagnosis not present

## 2013-09-30 DIAGNOSIS — N6019 Diffuse cystic mastopathy of unspecified breast: Secondary | ICD-10-CM | POA: Diagnosis present

## 2013-09-30 DIAGNOSIS — K219 Gastro-esophageal reflux disease without esophagitis: Secondary | ICD-10-CM | POA: Diagnosis present

## 2013-09-30 DIAGNOSIS — K630 Abscess of intestine: Secondary | ICD-10-CM | POA: Diagnosis present

## 2013-09-30 DIAGNOSIS — Z01810 Encounter for preprocedural cardiovascular examination: Secondary | ICD-10-CM | POA: Diagnosis not present

## 2013-09-30 DIAGNOSIS — K5780 Diverticulitis of intestine, part unspecified, with perforation and abscess without bleeding: Secondary | ICD-10-CM

## 2013-09-30 DIAGNOSIS — R935 Abnormal findings on diagnostic imaging of other abdominal regions, including retroperitoneum: Secondary | ICD-10-CM | POA: Diagnosis not present

## 2013-09-30 DIAGNOSIS — Z79899 Other long term (current) drug therapy: Secondary | ICD-10-CM

## 2013-09-30 DIAGNOSIS — K651 Peritoneal abscess: Secondary | ICD-10-CM | POA: Diagnosis not present

## 2013-09-30 DIAGNOSIS — D473 Essential (hemorrhagic) thrombocythemia: Secondary | ICD-10-CM | POA: Diagnosis present

## 2013-09-30 DIAGNOSIS — K5732 Diverticulitis of large intestine without perforation or abscess without bleeding: Secondary | ICD-10-CM

## 2013-09-30 DIAGNOSIS — F411 Generalized anxiety disorder: Secondary | ICD-10-CM | POA: Diagnosis present

## 2013-09-30 DIAGNOSIS — N183 Chronic kidney disease, stage 3 unspecified: Secondary | ICD-10-CM | POA: Diagnosis not present

## 2013-09-30 DIAGNOSIS — N184 Chronic kidney disease, stage 4 (severe): Secondary | ICD-10-CM | POA: Diagnosis present

## 2013-09-30 DIAGNOSIS — F329 Major depressive disorder, single episode, unspecified: Secondary | ICD-10-CM | POA: Diagnosis present

## 2013-09-30 DIAGNOSIS — K5730 Diverticulosis of large intestine without perforation or abscess without bleeding: Secondary | ICD-10-CM | POA: Diagnosis not present

## 2013-09-30 DIAGNOSIS — Z889 Allergy status to unspecified drugs, medicaments and biological substances status: Secondary | ICD-10-CM | POA: Diagnosis present

## 2013-09-30 DIAGNOSIS — K5740 Diverticulitis of both small and large intestine with perforation and abscess without bleeding: Secondary | ICD-10-CM

## 2013-09-30 DIAGNOSIS — I509 Heart failure, unspecified: Secondary | ICD-10-CM | POA: Diagnosis not present

## 2013-09-30 DIAGNOSIS — IMO0001 Reserved for inherently not codable concepts without codable children: Secondary | ICD-10-CM | POA: Diagnosis present

## 2013-09-30 DIAGNOSIS — N736 Female pelvic peritoneal adhesions (postinfective): Secondary | ICD-10-CM | POA: Diagnosis present

## 2013-09-30 DIAGNOSIS — E7800 Pure hypercholesterolemia, unspecified: Secondary | ICD-10-CM | POA: Diagnosis present

## 2013-09-30 DIAGNOSIS — F410 Panic disorder [episodic paroxysmal anxiety] without agoraphobia: Secondary | ICD-10-CM | POA: Diagnosis not present

## 2013-09-30 DIAGNOSIS — I420 Dilated cardiomyopathy: Secondary | ICD-10-CM

## 2013-09-30 DIAGNOSIS — R0989 Other specified symptoms and signs involving the circulatory and respiratory systems: Secondary | ICD-10-CM | POA: Diagnosis not present

## 2013-09-30 DIAGNOSIS — D62 Acute posthemorrhagic anemia: Secondary | ICD-10-CM | POA: Diagnosis present

## 2013-09-30 DIAGNOSIS — I5022 Chronic systolic (congestive) heart failure: Secondary | ICD-10-CM

## 2013-09-30 DIAGNOSIS — N731 Chronic parametritis and pelvic cellulitis: Secondary | ICD-10-CM | POA: Diagnosis present

## 2013-09-30 DIAGNOSIS — I10 Essential (primary) hypertension: Secondary | ICD-10-CM | POA: Diagnosis not present

## 2013-09-30 DIAGNOSIS — F32A Depression, unspecified: Secondary | ICD-10-CM

## 2013-09-30 DIAGNOSIS — F3289 Other specified depressive episodes: Secondary | ICD-10-CM | POA: Diagnosis present

## 2013-09-30 DIAGNOSIS — Z8249 Family history of ischemic heart disease and other diseases of the circulatory system: Secondary | ICD-10-CM

## 2013-09-30 DIAGNOSIS — T8140XA Infection following a procedure, unspecified, initial encounter: Secondary | ICD-10-CM | POA: Diagnosis present

## 2013-09-30 DIAGNOSIS — G4730 Sleep apnea, unspecified: Secondary | ICD-10-CM | POA: Diagnosis present

## 2013-09-30 DIAGNOSIS — M810 Age-related osteoporosis without current pathological fracture: Secondary | ICD-10-CM | POA: Diagnosis present

## 2013-09-30 DIAGNOSIS — I059 Rheumatic mitral valve disease, unspecified: Secondary | ICD-10-CM | POA: Diagnosis present

## 2013-09-30 DIAGNOSIS — I129 Hypertensive chronic kidney disease with stage 1 through stage 4 chronic kidney disease, or unspecified chronic kidney disease: Secondary | ICD-10-CM | POA: Diagnosis present

## 2013-09-30 DIAGNOSIS — N289 Disorder of kidney and ureter, unspecified: Secondary | ICD-10-CM | POA: Diagnosis present

## 2013-09-30 DIAGNOSIS — D649 Anemia, unspecified: Secondary | ICD-10-CM

## 2013-09-30 DIAGNOSIS — I428 Other cardiomyopathies: Secondary | ICD-10-CM | POA: Diagnosis not present

## 2013-09-30 DIAGNOSIS — IMO0002 Reserved for concepts with insufficient information to code with codable children: Secondary | ICD-10-CM

## 2013-09-30 DIAGNOSIS — Z95810 Presence of automatic (implantable) cardiac defibrillator: Secondary | ICD-10-CM

## 2013-09-30 DIAGNOSIS — Z9189 Other specified personal risk factors, not elsewhere classified: Secondary | ICD-10-CM | POA: Diagnosis present

## 2013-09-30 DIAGNOSIS — I5042 Chronic combined systolic (congestive) and diastolic (congestive) heart failure: Secondary | ICD-10-CM | POA: Diagnosis present

## 2013-09-30 DIAGNOSIS — K631 Perforation of intestine (nontraumatic): Secondary | ICD-10-CM | POA: Diagnosis not present

## 2013-09-30 HISTORY — DX: Other specified personal risk factors, not elsewhere classified: Z91.89

## 2013-09-30 HISTORY — DX: Diverticulitis of both small and large intestine with perforation and abscess without bleeding: K57.40

## 2013-09-30 LAB — COMPREHENSIVE METABOLIC PANEL
ALT: 19 U/L (ref 0–35)
AST: 17 U/L (ref 0–37)
Albumin: 3.4 g/dL — ABNORMAL LOW (ref 3.5–5.2)
Alkaline Phosphatase: 197 U/L — ABNORMAL HIGH (ref 39–117)
BUN: 24 mg/dL — ABNORMAL HIGH (ref 6–23)
CO2: 23 mEq/L (ref 19–32)
Calcium: 9.8 mg/dL (ref 8.4–10.5)
Chloride: 94 mEq/L — ABNORMAL LOW (ref 96–112)
Creatinine, Ser: 0.9 mg/dL (ref 0.50–1.10)
GFR calc Af Amer: 74 mL/min — ABNORMAL LOW (ref >90)
GFR calc non Af Amer: 64 mL/min — ABNORMAL LOW (ref >90)
Glucose, Bld: 97 mg/dL (ref 70–99)
Potassium: 4.9 mEq/L (ref 3.7–5.3)
Sodium: 131 mEq/L — ABNORMAL LOW (ref 137–147)
Total Bilirubin: 0.2 mg/dL — ABNORMAL LOW (ref 0.3–1.2)
Total Protein: 8 g/dL (ref 6.0–8.3)

## 2013-09-30 LAB — CBC WITH DIFFERENTIAL/PLATELET
Basophils Absolute: 0 10*3/uL (ref 0.0–0.1)
Basophils Relative: 0 % (ref 0–1)
Eosinophils Absolute: 0.1 10*3/uL (ref 0.0–0.7)
Eosinophils Relative: 1 % (ref 0–5)
HCT: 33.9 % — ABNORMAL LOW (ref 36.0–46.0)
Hemoglobin: 11.2 g/dL — ABNORMAL LOW (ref 12.0–15.0)
Lymphocytes Relative: 26 % (ref 12–46)
Lymphs Abs: 3.2 10*3/uL (ref 0.7–4.0)
MCH: 29.2 pg (ref 26.0–34.0)
MCHC: 33 g/dL (ref 30.0–36.0)
MCV: 88.3 fL (ref 78.0–100.0)
Monocytes Absolute: 1.1 10*3/uL — ABNORMAL HIGH (ref 0.1–1.0)
Monocytes Relative: 9 % (ref 3–12)
Neutro Abs: 8 10*3/uL — ABNORMAL HIGH (ref 1.7–7.7)
Neutrophils Relative %: 64 % (ref 43–77)
Platelets: 553 10*3/uL — ABNORMAL HIGH (ref 150–400)
RBC: 3.84 MIL/uL — ABNORMAL LOW (ref 3.87–5.11)
RDW: 13.1 % (ref 11.5–15.5)
WBC: 12.5 10*3/uL — ABNORMAL HIGH (ref 4.0–10.5)

## 2013-09-30 LAB — LACTIC ACID, PLASMA: Lactic Acid, Venous: 0.8 mmol/L (ref 0.5–2.2)

## 2013-09-30 LAB — LIPASE, BLOOD: Lipase: 55 U/L (ref 11–59)

## 2013-09-30 MED ORDER — ACETAMINOPHEN 325 MG PO TABS
650.0000 mg | ORAL_TABLET | Freq: Four times a day (QID) | ORAL | Status: DC | PRN
  Administered 2013-09-30 – 2013-10-01 (×2): 650 mg via ORAL
  Administered 2013-10-02 – 2013-10-03 (×2): 325 mg via ORAL
  Administered 2013-10-03: 650 mg via ORAL
  Administered 2013-10-03: 325 mg via ORAL
  Administered 2013-10-03 – 2013-10-05 (×4): 650 mg via ORAL
  Filled 2013-09-30 (×10): qty 2

## 2013-09-30 MED ORDER — ONDANSETRON HCL 4 MG PO TABS
4.0000 mg | ORAL_TABLET | Freq: Four times a day (QID) | ORAL | Status: DC | PRN
  Administered 2013-10-07 – 2013-10-08 (×2): 4 mg via ORAL
  Filled 2013-09-30 (×2): qty 1

## 2013-09-30 MED ORDER — METOPROLOL TARTRATE 1 MG/ML IV SOLN
2.5000 mg | Freq: Three times a day (TID) | INTRAVENOUS | Status: DC
  Administered 2013-10-02 – 2013-10-05 (×10): 2.5 mg via INTRAVENOUS
  Filled 2013-09-30 (×21): qty 5

## 2013-09-30 MED ORDER — SODIUM CHLORIDE 0.9 % IV SOLN
INTRAVENOUS | Status: DC
  Administered 2013-09-30: 20:00:00 via INTRAVENOUS

## 2013-09-30 MED ORDER — SODIUM CHLORIDE 0.9 % IV SOLN
INTRAVENOUS | Status: AC
  Administered 2013-09-30 – 2013-10-01 (×2): via INTRAVENOUS

## 2013-09-30 MED ORDER — ACETAMINOPHEN 650 MG RE SUPP: 650.0000 mg | Freq: Four times a day (QID) | RECTAL | Status: DC | PRN

## 2013-09-30 MED ORDER — ERTAPENEM SODIUM 1 G IJ SOLR
1.0000 g | Freq: Once | INTRAMUSCULAR | Status: AC
  Administered 2013-09-30: 1 g via INTRAVENOUS
  Filled 2013-09-30: qty 1

## 2013-09-30 MED ORDER — IOHEXOL 300 MG/ML  SOLN
80.0000 mL | Freq: Once | INTRAMUSCULAR | Status: AC | PRN
  Administered 2013-09-30: 80 mL via INTRAVENOUS

## 2013-09-30 MED ORDER — ONDANSETRON HCL 4 MG/2ML IJ SOLN
4.0000 mg | Freq: Four times a day (QID) | INTRAMUSCULAR | Status: DC | PRN
Start: 2013-09-30 — End: 2013-10-08
  Administered 2013-10-02 – 2013-10-07 (×12): 4 mg via INTRAVENOUS
  Filled 2013-09-30 (×15): qty 2

## 2013-09-30 MED ORDER — SODIUM CHLORIDE 0.9 % IV SOLN
1.0000 g | INTRAVENOUS | Status: DC
  Administered 2013-10-02 – 2013-10-08 (×7): 1 g via INTRAVENOUS
  Filled 2013-09-30 (×11): qty 1

## 2013-09-30 NOTE — ED Notes (Signed)
Spoke with IV team, they said it was okay to use PICC line

## 2013-09-30 NOTE — Consult Note (Addendum)
Reason for Consult:sigmoid diverticulitis with abscess Referring Physician: Francine Graven  Ashley [name] is an 68 y.o. female.  HPI: patient was vacationing in kidney awkward [place] when she developed severe abdominal pain. She was diagnosed at a local hospital with perforated diverticulitis. She was transferred to [facility]. She was hospitalized there for 13 days. She underwent laparoscopic irrigation and drain placement. She was treated with IV antibiotics. She did not initially improve and followup CT showed abscess. This was percutaneously drained. These drains were removed and she was discharged home 4 days ago. She had some lab work done in Dr. Bosie Clos office today for followup. She still had a elevated white blood cell counts she was sent for CAT scan. 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 She has mild abdominal pain. She had multiple loose bowel movements after the CAT scan today. We are asked to see her in consultation. Dr. Danise Mina also spoke with my partner, Dr. Zella Richer earlier today.  Past Medical History  Diagnosis Date  . Migraine without aura, without mention of intractable migraine without mention of status migrainosus   . History of diverticulitis of colon   . Pure hypercholesterolemia   . Irritable bowel syndrome   . Laryngeal nodule     Dr. Thomasena Edis  . Fibrocystic breast disease   . Osteoarthritis   . Hemorrhoids   . Anxiety   . GERD (gastroesophageal reflux disease)   . Systolic CHF     EF 27-25%, LV dilation, significant MR, goal weight 150lbs  . Nonischemic cardiomyopathy   . History of pneumonia 2012  . Mitral regurgitation   . Anxiety   . Bronchiectasis     possibly mild, treat URIs aggressively  . Hypertension   . Heart murmur   . Angina   . Lung disease 07/14/11  . Sleep apnea   . Shortness of breath     "resting"  . Anemia   . Fibromyalgia   . Depression   . Disorder of vocal cords   . Chronic sinusitis   .  Insomnia   . Osteoporosis 01/2013    Past Surgical History  Procedure Laterality Date  . Appendectomy    . Mandible surgery      "got about 6 pins in the bottom of my jaw"  . Knee arthroscopy w/ orif      Left; "meniscus tear"  . Osteotomy      Left foot  . 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 Dexa  03/28/99    osteopenia L/S  . Colonoscopy  1998    nml (sigmoid-Gilbert-nol)  . Head mri, head ct  10/1998    ? H/A focus ([facility])  . Emg/mcv  10/16/01    + mild carpal tunnel  . Dexa  03/12/03    2.2 Spine 0.1 Hip  Osteopenia  . Mri right hip  12/12/01    Tendonitis Gluteus Medius to Gtr Trochanter Neg Bursitis (Dr. Eulas Post)  . L/s films  11/21/01    nml; Right hip nml  . Mri u/s  11/21/01    min. DDD L/3-4  . Colonoscopy  01/04/04    polyps, diverticulosis/severe  . Dexa  11/14/05    1.9 Spine 0.3 Hip  slight improvement  . Flex laryngoscopy  06/11/06    ([facility]) nml  . Dexa  11/22/06    improved osteo and fem neck  . [facility] bunionectomy  11/04/08    Right Great Toe (Dr. Beola Cord)  . Breast  mass excision  01/2010    Left-fibrocystic change w/intraductal papilloma, no malignancy  . Ct chest  12/2010    possible bronchiectasis lower lobes, 02/2011 - enlarged bilateral effusions, bibasilar atx  . Stress myoview  02/2011    no significant ischemia  . Ct chest  04/2011    no PE.  abnormal R hilar and mediastinal adenopathy --> on rpt 05/2011, resolved  . US echocardiography  04/2011    mildly dilated LV, EF 25-30%, hypokinesis, restrictive physiology, mod MR, no AS  . Icd placement  07/14/11    w/pacemaker  . Tonsillectomy and adenoidectomy    . Breast biopsy      "I've had 2-3 bx; all benign"  . Augmentation mammaplasty    . 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 Abdominal hysterectomy  1987    w/BSO  . Lasik      left eye  . Knee surgery      left  . Mandible surgery    . Sinus surgery      x3 with balloon   . Dexa  01/2013     T score -2.6 at spine    Family History  Problem Relation Age of Onset  . Lung cancer Father     + smoker  . Dementia Mother   . Osteoporosis Mother     Lumbar spine  . Stroke Mother     x 5 @ 70 YOA  . Arthritis Mother     hands  . Emphysema Mother   . Alcohol abuse Paternal Grandfather   . Hypertension Maternal Grandfather     ?  . Stroke Paternal Grandfather     ?  Marland Kitchen Heart disease      grandmother  . Heart disease      grandfather    Social History:  reports that she has never smoked. She has never used smokeless tobacco. She reports that she does not drink alcohol or use illicit drugs.  Allergies:  Allergies  Allergen Reactions  . Antihistamines, Chlorpheniramine-Type Other (See Comments)    Makes her eyes look like she sees strobe lights  . Influenza Vac Split [Flu Virus Vaccine] Other (See Comments)    "allergic to concentrated eggs that are in vaccine"  . Penicillins     REACTION: "told 50 years ago I couldn't take it; might be immune to it", able to take amoxicillin  . Sulfasalazine Other (See Comments)    "makes my whole body smell like sulfa & makes me sick just smelling it"  . Atorvastatin Other (See Comments)    Severe muscle cramps to generic atorvastatin.  Able to take brand lipitor.  . Latex Nausea Only and Rash  . Rosuvastatin Other (See Comments)    Was not effective controlling lipids  . Chlorhexidine Other (See Comments)    blisters  . Cyclobenzaprine Other (See Comments)    Adverse reaction - back and throat pain, dizziness, exhaustion  . Cymbalta [Duloxetine Hcl] Other (See Comments)    "body spasms and made me feel weird in my head"   . Metaxalone   . Prevacid [Lansoprazole] Other (See Comments)    Worsened GI side effects  . Red Dye     Nausea, swelling    Medications: Prior to Admission:  (Not in a hospital admission)  Results for orders placed in visit on 09/29/13 (from the past 48 hour(s))  COMPREHENSIVE METABOLIC PANEL  Status: Abnormal   Collection Time    09/29/13  3:16 PM      Result Value Ref Range   Sodium 132 (*) 135 - 145 mEq/L   Potassium 5.2 (*) 3.5 - 5.1 mEq/L   Chloride 94 (*) 96 - 112 mEq/L   CO2 26  19 - 32 mEq/L   Glucose, Bld 99  70 - 99 mg/dL   BUN 27 (*) 6 - 23 mg/dL   Creatinine, Ser 1.1  0.4 - 1.2 mg/dL   Total Bilirubin 0.3  0.2 - 1.2 mg/dL   Alkaline Phosphatase 191 (*) 39 - 117 U/L   AST 16  0 - 37 U/L   ALT 21  0 - 35 U/L   Total Protein 8.5 (*) 6.0 - 8.3 g/dL   Albumin 3.6  3.5 - 5.2 g/dL   Calcium 10.2  8.4 - 10.5 mg/dL   GFR 51.39 (*) >60.00 mL/min  CBC WITH DIFFERENTIAL     Status: Abnormal   Collection Time    09/29/13  3:16 PM      Result Value Ref Range   WBC 16.2 (*) 4.0 - 10.5 K/uL   RBC 3.97  3.87 - 5.11 Mil/uL   Hemoglobin 11.6 (*) 12.0 - 15.0 g/dL   HCT 35.0 (*) 36.0 - 46.0 %   MCV 88.2  78.0 - 100.0 fl   MCHC 33.1  30.0 - 36.0 g/dL   RDW 13.1  11.5 - 15.5 %   Platelets 607.0 (*) 150.0 - 400.0 K/uL   Neutrophils Relative % 66.6  43.0 - 77.0 %   Lymphocytes Relative 23.6  12.0 - 46.0 %   Monocytes Relative 7.6  3.0 - 12.0 %   Eosinophils Relative 1.0  0.0 - 5.0 %   Basophils Relative 1.2  0.0 - 3.0 %   Neutro Abs 10.8 (*) 1.4 - 7.7 K/uL   Lymphs Abs 3.8  0.7 - 4.0 K/uL   Monocytes Absolute 1.2 (*) 0.1 - 1.0 K/uL   Eosinophils Absolute 0.2  0.0 - 0.7 K/uL   Basophils Absolute 0.2 (*) 0.0 - 0.1 K/uL    Ct Abdomen Pelvis W Contrast  09/30/2013   CLINICAL DATA:  Sigmoid diverticulitis, followup, recent laparoscopic surgery with 3 abscess drains at an outside facility.  EXAM: CT ABDOMEN AND PELVIS WITH CONTRAST  TECHNIQUE: Multidetector CT imaging of the abdomen and pelvis was performed using the standard protocol following bolus administration of intravenous contrast. Sagittal and coronal MPR images reconstructed from axial data set.  CONTRAST:  43mL OMNIPAQUE IOHEXOL 300 MG/ML SOLN IV. Dilute oral contrast.  COMPARISON:  None  FINDINGS: Dependent atelectasis  at both lung bases.  AICD lead in RIGHT ventricle.  Small diaphragmatic defect at posterior medial LEFT diaphragm containing fat.  Tiny BILATERAL renal cysts.  Liver, spleen, pancreas, kidneys, and adrenal glands otherwise normal.  Uterus surgically absent with nonvisualization of ovaries and appendix.  Bladder decompressed.  Extraluminal collection of gas, fluid and contrast identified in central pelvis, superior to and extending to the urinary bladder, located between small bowel loops and sigmoid colon, measuring approximately 7.4 cm in length, up to 3.4 cm transverse and 2.5 cm AP.  Finding is compatible with a abscess collection which communicates with the GI tract.  Free intraperitoneal identified in the LEFT pelvis extending from image 69 through image 77.  Diverticulosis of the distal descending and sigmoid colon with sigmoid wall thickening and persistent pericolic inflammatory changes at the sigmoid mesocolon.  Stomach and  remaining bowel loops unremarkable.  No additional abscess collections identified.  No surgical drains identified.  No mass, adenopathy, ascites, or acute osseous findings.  IMPRESSION: Sigmoid diverticulitis.  Abscess collection within the lower central pelvis which communicates with the GI tract, containing GI contrast, measuring in maximal dimensions 7.4 cm CC x 3.4 cm transverse x 2.5 cm AP.  Free intraperitoneal air in LEFT pelvis ; this could be from drainage catheters if they were recently removed or from continued intraperitoneal bowel perforation/leak.  Findings called to Dr. Leo Grosser on 09/30/2013 at 1533 hours.   Electronically Signed   By: Lavonia Dana M.D.   On: 09/30/2013 15:36    Review of Systems  Constitutional: Positive for weight loss. Negative for fever and chills.  HENT: Negative.   Eyes: Negative.   Respiratory: Negative for shortness of breath and wheezing.   Cardiovascular: Negative for chest pain and leg swelling.  Gastrointestinal: Positive for  abdominal pain. Negative for nausea and vomiting.  Genitourinary: Negative.   Musculoskeletal: Negative.   Skin: Negative.   Neurological: Negative.   Endo/Heme/Allergies: Negative.   Psychiatric/Behavioral: Negative.    Blood pressure 125/68, pulse 80, temperature 97.7 F (36.5 C), temperature source Oral, resp. rate 22, height 5\' 5"  (1.651 m), weight 156 lb (70.761 kg), SpO2 97.00%. Physical Exam  Constitutional: She is oriented to person, place, and time. She appears well-developed and well-nourished. No distress.  HENT:  Head: Normocephalic and atraumatic.  Nose: Nose normal.  Mouth/Throat: Oropharynx is clear and moist.  Eyes: EOM are normal. Pupils are equal, round, and reactive to light. Right eye exhibits no discharge. Left eye exhibits no discharge. No scleral icterus.  Neck: Normal range of motion. Neck supple. No tracheal deviation present.  Cardiovascular: Normal rate and normal heart sounds.   Respiratory: Effort normal and breath sounds normal. No stridor. No respiratory distress. She has no wheezes. She has no rales.  Pacemaker left upper chest  GI: Soft. She exhibits no distension. There is tenderness. There is no rebound and no guarding.  Tenderness in the suprapubic region is mild without guarding, no peritonitis, no mass, laparoscopic incisions are healing well.  Musculoskeletal: Normal range of motion.  Neurological: She is alert and oriented to person, place, and time.  Skin: Skin is warm and dry.  Psychiatric: She has a normal mood and affect.    Assessment/Plan: Perforated sigmoid diverticulitis status post previous laparoscopic washout and drain placement followed by percutaneous drain placement. All of these drains are out now. I recommend medical admission as planned by Dr. Danise Mina with the hospitalist service. IV antibiotics and bowel rest - NPO. Also recommend placement of CT-guided drainage catheter in the morning by interventional radiology. We will  follow closely. If she does not improve, she will need segmental colectomy with colostomy and we discussed this in detail.  Zenovia Jarred 09/30/2013, 6:08 PM

## 2013-09-30 NOTE — ED Notes (Addendum)
Pt requesting to have her PICC line accessed for blood work and meds. PICC line not placed at [facility]. Will need to have Xray and IV team to confirm placement before using it and informed pt that it would cause delay of care. Pt aware of such and is still requesting NOT to be stuck for blood or have a peripheral IV start.

## 2013-09-30 NOTE — ED Notes (Signed)
Spoke with IV Team RN regarding portable xray performed and that they need to review it to confirm PICC placement.

## 2013-09-30 NOTE — ED Notes (Signed)
Dr. Grandville Silos, Surgeon, at the bedside assessing patient

## 2013-09-30 NOTE — Assessment & Plan Note (Signed)
Off torsemide currently, remain off.  No evidence of fluid overload today Has lost 16 lbs recent hospitalization

## 2013-09-30 NOTE — Telephone Encounter (Signed)
Form in your IN box for completion. 

## 2013-09-30 NOTE — Telephone Encounter (Signed)
I want patient to continue IV meropenem until last dose PM 10/03/2013.  plz contact [facility] to provide enough meropenem for this.  Let me know if I need to send written script for this.

## 2013-09-30 NOTE — H&P (Signed)
[facility] History and Physical  Ashley [name] GLO:[id number] DOB: March 02, 1946 DOA: 09/30/2013  Referring physician: ER physician. PCP: [name], MD   Chief Complaint: Abdominal pelvic abscess.  HPI: Ashley [name] is a 68 y.o. female history of cardiomyopathy and chronic kidney disease was recently admitted at [facility] in [place] for ruptured diverticulitis with abscess. Patient initially had laparoscopic washout and drain placed followed by percutaneous drain placement once patient was found to have persistent abscess. Last week patient's drain was removed and last Friday 5 days ago patient was discharged home on imipenem with PICC line. Patient followed with primary care physician yesterday and was found to have persistent leukocytosis and primary care physician ordered CT abdomen and pelvis which shows abscess connecting to colon. Dr. Georganna Skeans was consulted and at this time patient has been admitted for further management. Patient has had at least 4 bouts of diarrhea after patient had contrast. Has mild left lower quadrant pain. Denies any nausea vomiting. Denies any fever chills or chest pain.   Review of Systems: As presented in the history of presenting illness, rest negative.  Past Medical History  Diagnosis Date  . Migraine without aura, without mention of intractable migraine without mention of status migrainosus   . History of diverticulitis of colon   . Pure hypercholesterolemia   . Irritable bowel syndrome   . Laryngeal nodule     Dr. Thomasena Edis  . Fibrocystic breast disease   . Osteoarthritis   . Hemorrhoids   . Anxiety   . GERD (gastroesophageal reflux disease)   . Systolic CHF     EF 18-84%, LV dilation, significant MR, goal weight 150lbs  . Nonischemic cardiomyopathy   . History of pneumonia 2012  . Mitral regurgitation   . Anxiety   . Bronchiectasis     possibly mild, treat URIs aggressively  . Hypertension   . Heart murmur   .  Angina   . Lung disease 07/14/11  . Sleep apnea   . Shortness of breath     "resting"  . Anemia   . Fibromyalgia   . Depression   . Disorder of vocal cords   . Chronic sinusitis   . Insomnia   . Osteoporosis 01/2013   Past Surgical History  Procedure Laterality Date  . Appendectomy    . Mandible surgery      "got about 6 pins in the bottom of my jaw"  . Knee arthroscopy w/ orif      Left; "meniscus tear"  . Osteotomy      Left foot  . 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 Dexa  03/28/99    osteopenia L/S  . Colonoscopy  1998    nml (sigmoid-Gilbert-nol)  . Head mri, head ct  10/1998    ? H/A focus ([facility])  . Emg/mcv  10/16/01    + mild carpal tunnel  . Dexa  03/12/03    2.2 Spine 0.1 Hip  Osteopenia  . Mri right hip  12/12/01    Tendonitis Gluteus Medius to Gtr Trochanter Neg Bursitis (Dr. Eulas Post)  . L/s films  11/21/01    nml; Right hip nml  . Mri u/s  11/21/01    min. DDD L/3-4  . Colonoscopy  01/04/04    polyps, diverticulosis/severe  . Dexa  11/14/05    1.9 Spine 0.3 Hip  slight improvement  . Flex laryngoscopy  06/11/06    ([facility]) nml  . Dexa  11/22/06  improved osteo and fem neck  . [facility] bunionectomy  11/04/08    Right Great Toe (Dr. Beola Cord)  . Breast mass excision  01/2010    Left-fibrocystic change w/intraductal papilloma, no malignancy  . Ct chest  12/2010    possible bronchiectasis lower lobes, 02/2011 - enlarged bilateral effusions, bibasilar atx  . Stress myoview  02/2011    no significant ischemia  . Ct chest  04/2011    no PE.  abnormal R hilar and mediastinal adenopathy --> on rpt 05/2011, resolved  . US echocardiography  04/2011    mildly dilated LV, EF 25-30%, hypokinesis, restrictive physiology, mod MR, no AS  . Icd placement  07/14/11    w/pacemaker  . Tonsillectomy and adenoidectomy    . Breast biopsy      "I've had 2-3 bx; all benign"  . Augmentation mammaplasty    . Toe amputation      "toe beside baby toe on left foot; got gangrene  from corn"  . Abdominal hysterectomy  1987    w/BSO  . Lasik      left eye  . Knee surgery      left  . Mandible surgery    . Sinus surgery      x3 with balloon   . Dexa  01/2013    T score -2.6 at spine   Social History:  reports that she has never smoked. She has never used smokeless tobacco. She reports that she does not drink alcohol or use illicit drugs. Where does patient live home. Can patient participate in ADLs? Yes.  Allergies  Allergen Reactions  . Antihistamines, Chlorpheniramine-Type Other (See Comments)    Makes her eyes look like she sees strobe lights  . Influenza Vac Split [Flu Virus Vaccine] Other (See Comments)    "allergic to concentrated eggs that are in vaccine"  . Penicillins     REACTION: "told 50 years ago I couldn't take it; might be immune to it", able to take amoxicillin  . Sulfasalazine Other (See Comments)    "makes my whole body smell like sulfa & makes me sick just smelling it"  . Atorvastatin Other (See Comments)    Severe muscle cramps to generic atorvastatin.  Able to take brand lipitor.  . Latex Nausea Only and Rash  . Rosuvastatin Other (See Comments)    Was not effective controlling lipids  . Chlorhexidine Other (See Comments)    blisters  . Cyclobenzaprine Other (See Comments)    Adverse reaction - back and throat pain, dizziness, exhaustion  . Cymbalta [Duloxetine Hcl] Other (See Comments)    "body spasms and made me feel weird in my head"   . Metaxalone   . Prevacid [Lansoprazole] Other (See Comments)    Worsened GI side effects  . Red Dye     Nausea, swelling    Family History:  Family History  Problem Relation Age of Onset  . Lung cancer Father     + smoker  . Dementia Mother   . Osteoporosis Mother     Lumbar spine  . Stroke Mother     x 5 @ 77 YOA  . Arthritis Mother     hands  . Emphysema Mother   . Alcohol abuse Paternal Grandfather   . Hypertension Maternal Grandfather     ?  . Stroke Paternal Grandfather      ?  Marland Kitchen Heart disease      grandmother  . Heart disease  grandfather      Prior to Admission medications   Medication Sig Start Date End Date Taking? Authorizing Provider  ALPRAZolam Duanne Moron) 0.25 MG tablet TAKE 1 TABLET TWICE A DAY 10/09/12  Yes [name], MD  CALCIUM-VITAMIN D PO Take 1 capsule by mouth daily.   Yes Historical Provider, MD  carvedilol (COREG) 12.5 MG tablet Take 6.25 mg by mouth 2 (two) times daily with a meal.   Yes Historical Provider, MD  Coenzyme Q10 (COQ-10) 150 MG CAPS Take 1 tablet by mouth daily. 02/21/12  Yes [name], MD  dexlansoprazole (DEXILANT) 60 MG capsule Take 1 capsule (60 mg total) by mouth daily. 08/20/13  Yes [name], MD  diclofenac sodium (VOLTAREN) 1 % GEL Apply 1 application topically 3 (three) times daily. 12/03/12  Yes [name], MD  meropenem 1 g in sodium chloride 0.9 % 100 mL Inject 1 g into the vein every 12 (twelve) hours. Patient to take 10-14 day regimen. Pt's on day 6 of therapy.   Yes Historical Provider, MD  midodrine (PROAMATINE) 2.5 MG tablet Take 1 tablet (2.5 mg total) by mouth 3 (three) times daily as needed. 01/31/13  Yes Minna Merritts, MD  ondansetron (ZOFRAN-ODT) 4 MG disintegrating tablet Take 8 mg by mouth every 8 (eight) hours as needed for nausea or vomiting.   Yes Historical Provider, MD  oxyCODONE-acetaminophen (PERCOCET/ROXICET) 5-325 MG per tablet Take 1 tablet by mouth every 4 (four) hours as needed for severe pain.   Yes Historical Provider, MD  spironolactone (ALDACTONE) 25 MG tablet TAKE 1/2 TABLET (12.5MG  TOTAL) BY MOUTH DAILY. 01/03/13  Yes Minna Merritts, MD  vitamin C (ASCORBIC ACID) 500 MG tablet Take 500 mg by mouth 2 (two) times daily.    Yes Historical Provider, MD  vitamin E 400 UNIT capsule Take 400 Units by mouth daily.   Yes Historical Provider, MD  zolpidem (AMBIEN) 5 MG tablet TAKE 1/2 TO 1 TABLET AT BEDTIME AS NEEDED 06/08/13  Yes [name], MD    Physical  Exam: Filed Vitals:   09/30/13 1900 09/30/13 1915 09/30/13 1930 09/30/13 1945  BP: 118/57 117/62 112/52 112/59  Pulse: 73 75 75 77  Temp:      TempSrc:      Resp: 13 17 21 20   Height:      Weight:      SpO2: 98% 97% 96% 98%     General:  Well-developed and nourished.  Eyes: Anicteric no pallor.  ENT: No discharge from the ears eyes nose mouth.  Neck: No mass felt.  Cardiovascular: S1-S2 heard.  Respiratory: No rhonchi or crepitations.  Abdomen: Soft nontender bowel sounds present. No guarding or rigidity.  Skin: Scar from recent surgery on the abdomen.  Musculoskeletal: No rash.  Psychiatric: Appears normal.  Neurologic: Alert awake oriented to time place and person. Moves all extremities.  Labs on Admission:  Basic Metabolic Panel:  Recent Labs Lab 09/29/13 1516  NA 132*  K 5.2*  CL 94*  CO2 26  GLUCOSE 99  BUN 27*  CREATININE 1.1  CALCIUM 10.2   Liver Function Tests:  Recent Labs Lab 09/29/13 1516  AST 16  ALT 21  ALKPHOS 191*  BILITOT 0.3  PROT 8.5*  ALBUMIN 3.6   No results found for this basename: LIPASE, AMYLASE,  in the last 168 hours No results found for this basename: AMMONIA,  in the last 168 hours CBC:  Recent Labs Lab 09/29/13 1516 09/30/13 1705  WBC 16.2* 12.5*  NEUTROABS 10.8* 8.0*  HGB 11.6* 11.2*  HCT 35.0* 33.9*  MCV 88.2 88.3  PLT 607.0* 553*   Cardiac Enzymes: No results found for this basename: CKTOTAL, CKMB, CKMBINDEX, TROPONINI,  in the last 168 hours  BNP (last 3 results) No results found for this basename: PROBNP,  in the last 8760 hours CBG: No results found for this basename: GLUCAP,  in the last 168 hours  Radiological Exams on Admission: Ct Abdomen Pelvis W Contrast  09/30/2013   CLINICAL DATA:  Sigmoid diverticulitis, followup, recent laparoscopic surgery with 3 abscess drains at an outside facility.  EXAM: CT ABDOMEN AND PELVIS WITH CONTRAST  TECHNIQUE: Multidetector CT imaging of the abdomen and  pelvis was performed using the standard protocol following bolus administration of intravenous contrast. Sagittal and coronal MPR images reconstructed from axial data set.  CONTRAST:  62mL OMNIPAQUE IOHEXOL 300 MG/ML SOLN IV. Dilute oral contrast.  COMPARISON:  None  FINDINGS: Dependent atelectasis at both lung bases.  AICD lead in RIGHT ventricle.  Small diaphragmatic defect at posterior medial LEFT diaphragm containing fat.  Tiny BILATERAL renal cysts.  Liver, spleen, pancreas, kidneys, and adrenal glands otherwise normal.  Uterus surgically absent with nonvisualization of ovaries and appendix.  Bladder decompressed.  Extraluminal collection of gas, fluid and contrast identified in central pelvis, superior to and extending to the urinary bladder, located between small bowel loops and sigmoid colon, measuring approximately 7.4 cm in length, up to 3.4 cm transverse and 2.5 cm AP.  Finding is compatible with a abscess collection which communicates with the GI tract.  Free intraperitoneal identified in the LEFT pelvis extending from image 69 through image 77.  Diverticulosis of the distal descending and sigmoid colon with sigmoid wall thickening and persistent pericolic inflammatory changes at the sigmoid mesocolon.  Stomach and remaining bowel loops unremarkable.  No additional abscess collections identified.  No surgical drains identified.  No mass, adenopathy, ascites, or acute osseous findings.  IMPRESSION: Sigmoid diverticulitis.  Abscess collection within the lower central pelvis which communicates with the GI tract, containing GI contrast, measuring in maximal dimensions 7.4 cm CC x 3.4 cm transverse x 2.5 cm AP.  Free intraperitoneal air in LEFT pelvis ; this could be from drainage catheters if they were recently removed or from continued intraperitoneal bowel perforation/leak.  Findings called to Dr. Leo Grosser on 09/30/2013 at 1533 hours.   Electronically Signed   By: Lavonia Dana M.D.   On: 09/30/2013 15:36    Dg Chest Portable 1 View  09/30/2013   CLINICAL DATA:  Abdominal pain.  Diarrhea.  EXAM: PORTABLE CHEST - 1 VIEW  COMPARISON:  06/11/2012  FINDINGS: AICD in place. PICC in good position in the superior vena cava above the cavoatrial junction 2 cm below the carina.  Heart size and pulmonary vascularity are normal. Lungs are clear. No acute osseous abnormality.  IMPRESSION: No acute abnormalities.   Electronically Signed   By: Rozetta Nunnery M.D.   On: 09/30/2013 19:19     Assessment/Plan Principal Problem:   Abdominopelvic abscess Active Problems:   Chronic systolic CHF (congestive heart failure)   CKD (chronic kidney disease) stage 3, GFR 30-59 ml/min   Anemia   Abscess, abdomen   1. Abdominopelvic abscess - appreciate surgery consult and at this time they have recommended interventional radiologist to place percutaneous drain for which all has been already placed. Patient will be placed n.p.o. and on Invanz. 2. History of cardiomyopathy status post ICD placement last EF measured was 50-55%  in October 2014 - presently as per patient's PCP notes patient has lost 16 pounds from recent hospitalization and diuretics were on hold. Patient is gently being hydrated now and caution that patient does not get fluid overloaded. 3. Chronic kidney disease - creatinine appears to be at baseline. 4. Mild anemia - follow CBC. 5. Probably reactive thrombocytosis - follow CBC.    Code Status: Full code.  Family Communication: Patient's husband at the bedside.  Disposition Plan: Admit to inpatient.    [facility] Pager [phone].  If 7PM-7AM, please contact night-coverage [email or web address] Password [facility] 09/30/2013, 8:37 PM

## 2013-09-30 NOTE — ED Notes (Signed)
Pt has refused her second set of cultures.

## 2013-09-30 NOTE — ED Provider Notes (Signed)
CSN: [id number]     Arrival date & time 09/30/13  1614 History   First MD Initiated Contact with Patient 09/30/13 1656     Chief Complaint  Patient presents with  . Abdominal Pain  . Diarrhea     HPI Pt was seen at 1715. Per pt and her husband, c/o gradual onset and worsening of persistent abd "pain" for the past 2 to 3 weeks. Pt was admitted to OSH 09/12/13 for dx perforated diverticulitis with abscess, tx surgery and had 2 drains placed. Pt states her "WBC count kept going up and down," so she was discharged with PICC line for IV abx. Pt states despite the IV abx, pt has been having abd pain. Pt was evaluated by her PMD and was told "my WBC count was still elevated." Pt had an outpt CT scan today, dx abd abscess with fistula, and was told to come back to the hospital for further evaluation and admission. Pt denies CP/SOB, no fevers, no back pain, no N/V/D.    Past Medical History  Diagnosis Date  . Migraine without aura, without mention of intractable migraine without mention of status migrainosus   . History of diverticulitis of colon   . Pure hypercholesterolemia   . Irritable bowel syndrome   . Laryngeal nodule     Dr. Thomasena Edis  . Fibrocystic breast disease   . Osteoarthritis   . Hemorrhoids   . Anxiety   . GERD (gastroesophageal reflux disease)   . Systolic CHF     EF 73-71%, LV dilation, significant MR, goal weight 150lbs  . Nonischemic cardiomyopathy   . History of pneumonia 2012  . Mitral regurgitation   . Anxiety   . Bronchiectasis     possibly mild, treat URIs aggressively  . Hypertension   . Heart murmur   . Angina   . Lung disease 07/14/11  . Sleep apnea   . Shortness of breath     "resting"  . Anemia   . Fibromyalgia   . Depression   . Disorder of vocal cords   . Chronic sinusitis   . Insomnia   . Osteoporosis 01/2013   Past Surgical History  Procedure Laterality Date  . Appendectomy    . Mandible surgery      "got about 6 pins in the bottom of my jaw"  .  Knee arthroscopy w/ orif      Left; "meniscus tear"  . Osteotomy      Left foot  . 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 Dexa  03/28/99    osteopenia L/S  . Colonoscopy  1998    nml (sigmoid-Gilbert-nol)  . Head mri, head ct  10/1998    ? H/A focus ([facility])  . Emg/mcv  10/16/01    + mild carpal tunnel  . Dexa  03/12/03    2.2 Spine 0.1 Hip  Osteopenia  . Mri right hip  12/12/01    Tendonitis Gluteus Medius to Gtr Trochanter Neg Bursitis (Dr. Eulas Post)  . L/s films  11/21/01    nml; Right hip nml  . Mri u/s  11/21/01    min. DDD L/3-4  . Colonoscopy  01/04/04    polyps, diverticulosis/severe  . Dexa  11/14/05    1.9 Spine 0.3 Hip  slight improvement  . Flex laryngoscopy  06/11/06    ([facility]) nml  . Dexa  11/22/06    improved osteo and fem neck  . [facility] bunionectomy  11/04/08  Right Great Toe (Dr. Beola Cord)  . Breast mass excision  01/2010    Left-fibrocystic change w/intraductal papilloma, no malignancy  . Ct chest  12/2010    possible bronchiectasis lower lobes, 02/2011 - enlarged bilateral effusions, bibasilar atx  . Stress myoview  02/2011    no significant ischemia  . Ct chest  04/2011    no PE.  abnormal R hilar and mediastinal adenopathy --> on rpt 05/2011, resolved  . US echocardiography  04/2011    mildly dilated LV, EF 25-30%, hypokinesis, restrictive physiology, mod MR, no AS  . Icd placement  07/14/11    w/pacemaker  . Tonsillectomy and adenoidectomy    . Breast biopsy      "I've had 2-3 bx; all benign"  . Augmentation mammaplasty    . 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 Abdominal hysterectomy  1987    w/BSO  . Lasik      left eye  . Knee surgery      left  . Mandible surgery    . Sinus surgery      x3 with balloon   . Dexa  01/2013    T score -2.6 at spine   Family History  Problem Relation Age of Onset  . Lung cancer Father     + smoker  . Dementia Mother   . Osteoporosis Mother     Lumbar spine  .  Stroke Mother     x 5 @ 97 YOA  . Arthritis Mother     hands  . Emphysema Mother   . Alcohol abuse Paternal Grandfather   . Hypertension Maternal Grandfather     ?  . Stroke Paternal Grandfather     ?  Marland Kitchen Heart disease      grandmother  . Heart disease      grandfather   History  Substance Use Topics  . Smoking status: Never Smoker   . Smokeless tobacco: Never Used     Comment: Lived with smokers x 23 yrs, smoked herself "for a week"  . Alcohol Use: No    Review of Systems ROS: Statement: All systems negative except as marked or noted in the HPI; Constitutional: Negative for fever and chills. ; ; Eyes: Negative for eye pain, redness and discharge. ; ; ENMT: Negative for ear pain, hoarseness, nasal congestion, sinus pressure and sore throat. ; ; Cardiovascular: Negative for chest pain, palpitations, diaphoresis, dyspnea and peripheral edema. ; ; Respiratory: Negative for cough, wheezing and stridor. ; ; Gastrointestinal: +abd pain. Negative for nausea, vomiting, diarrhea, blood in stool, hematemesis, jaundice and rectal bleeding. . ; ; Genitourinary: Negative for dysuria, flank pain and hematuria. ; ; Musculoskeletal: Negative for back pain and neck pain. Negative for swelling and trauma.; ; Skin: Negative for pruritus, rash, abrasions, blisters, bruising and skin lesion.; ; Neuro: Negative for headache, lightheadedness and neck stiffness. Negative for weakness, altered level of consciousness , altered mental status, extremity weakness, paresthesias, involuntary movement, seizure and syncope.       Allergies  Antihistamines, chlorpheniramine-type; Influenza vac split; Penicillins; Sulfasalazine; Atorvastatin; Latex; Rosuvastatin; Chlorhexidine; Cyclobenzaprine; Cymbalta; Metaxalone; Prevacid; and Red dye  Home Medications   Prior to Admission medications   Medication Sig Start Date End Date Taking? Authorizing Provider  ALPRAZolam Duanne Moron) 0.25 MG tablet TAKE 1 TABLET TWICE A DAY  10/09/12  Yes [name], MD  CALCIUM-VITAMIN D PO Take 1 capsule by mouth daily.  Yes Historical Provider, MD  carvedilol (COREG) 12.5 MG tablet Take 6.25 mg by mouth 2 (two) times daily with a meal.   Yes Historical Provider, MD  Coenzyme Q10 (COQ-10) 150 MG CAPS Take 1 tablet by mouth daily. 02/21/12  Yes [name], MD  dexlansoprazole (DEXILANT) 60 MG capsule Take 1 capsule (60 mg total) by mouth daily. 08/20/13  Yes [name], MD  diclofenac sodium (VOLTAREN) 1 % GEL Apply 1 application topically 3 (three) times daily. 12/03/12  Yes [name], MD  meropenem 1 g in sodium chloride 0.9 % 100 mL Inject 1 g into the vein every 12 (twelve) hours. Patient to take 10-14 day regimen. Pt's on day 6 of therapy.   Yes Historical Provider, MD  midodrine (PROAMATINE) 2.5 MG tablet Take 1 tablet (2.5 mg total) by mouth 3 (three) times daily as needed. 01/31/13  Yes Minna Merritts, MD  ondansetron (ZOFRAN-ODT) 4 MG disintegrating tablet Take 8 mg by mouth every 8 (eight) hours as needed for nausea or vomiting.   Yes Historical Provider, MD  oxyCODONE-acetaminophen (PERCOCET/ROXICET) 5-325 MG per tablet Take 1 tablet by mouth every 4 (four) hours as needed for severe pain.   Yes Historical Provider, MD  spironolactone (ALDACTONE) 25 MG tablet TAKE 1/2 TABLET (12.5MG  TOTAL) BY MOUTH DAILY. 01/03/13  Yes Minna Merritts, MD  vitamin C (ASCORBIC ACID) 500 MG tablet Take 500 mg by mouth 2 (two) times daily.    Yes Historical Provider, MD  vitamin E 400 UNIT capsule Take 400 Units by mouth daily.   Yes Historical Provider, MD  zolpidem (AMBIEN) 5 MG tablet TAKE 1/2 TO 1 TABLET AT BEDTIME AS NEEDED 06/08/13  Yes [name], MD   BP 125/68  Pulse 80  Temp(Src) 97.7 F (36.5 C) (Oral)  Resp 22  Ht 5\' 5"  (1.651 m)  Wt 156 lb (70.761 kg)  BMI 25.96 kg/m2  SpO2 97% Physical Exam 1720: Physical examination:  Nursing notes reviewed; Vital signs and O2 SAT reviewed;  Constitutional:  Well developed, Well nourished, Well hydrated, In no acute distress; Head:  Normocephalic, atraumatic; Eyes: EOMI, PERRL, No scleral icterus; ENMT: Mouth and pharynx normal, Mucous membranes moist; Neck: Supple, Full range of motion, No lymphadenopathy; Cardiovascular: Regular rate and rhythm, [place]; Respiratory: Breath sounds clear & equal bilaterally, No rales, rhonchi, wheezes.  Speaking full sentences with ease, Normal respiratory effort/excursion; Chest: Nontender, Movement normal; Abdomen: Soft, +diffuse tenderness to palp. Nondistended, Normal bowel sounds; Genitourinary: No CVA tenderness; Extremities: Pulses normal, No tenderness, +PICC line RUE. No edema, No calf edema or asymmetry.; Neuro: AA&Ox3, Major CN grossly intact.  Speech clear. No gross focal motor or sensory deficits in extremities.; Skin: Color normal, Warm, Dry.   ED Course  Procedures     EKG Interpretation   Date/Time:  Tuesday Sep 30 2013 17:31:27 EDT Ventricular Rate:  81 PR Interval:  155 QRS Duration: 103 QT Interval:  369 QTC Calculation: 428 R Axis:   3 Text Interpretation:  Sinus rhythm Normal ECG When compared with ECG of  07/15/2011 No significant change was found Confirmed by [facility] [place]  MD,  Nunzio Cory [phone]) on 09/30/2013 5:49:58 PM      MDM  MDM Reviewed: previous chart, nursing note and vitals Reviewed previous: labs, CT scan and ECG Interpretation: ECG and x-ray   Ct Abdomen Pelvis W Contrast 09/30/2013   CLINICAL DATA:  Sigmoid diverticulitis, followup, recent laparoscopic surgery with 3 abscess drains at an outside facility.  EXAM: CT ABDOMEN AND  PELVIS WITH CONTRAST  TECHNIQUE: Multidetector CT imaging of the abdomen and pelvis was performed using the standard protocol following bolus administration of intravenous contrast. Sagittal and coronal MPR images reconstructed from axial data set.  CONTRAST:  38mL OMNIPAQUE IOHEXOL 300 MG/ML SOLN IV. Dilute oral contrast.  COMPARISON:  None  FINDINGS:  Dependent atelectasis at both lung bases.  AICD lead in RIGHT ventricle.  Small diaphragmatic defect at posterior medial LEFT diaphragm containing fat.  Tiny BILATERAL renal cysts.  Liver, spleen, pancreas, kidneys, and adrenal glands otherwise normal.  Uterus surgically absent with nonvisualization of ovaries and appendix.  Bladder decompressed.  Extraluminal collection of gas, fluid and contrast identified in central pelvis, superior to and extending to the urinary bladder, located between small bowel loops and sigmoid colon, measuring approximately 7.4 cm in length, up to 3.4 cm transverse and 2.5 cm AP.  Finding is compatible with a abscess collection which communicates with the GI tract.  Free intraperitoneal identified in the LEFT pelvis extending from image 69 through image 77.  Diverticulosis of the distal descending and sigmoid colon with sigmoid wall thickening and persistent pericolic inflammatory changes at the sigmoid mesocolon.  Stomach and remaining bowel loops unremarkable.  No additional abscess collections identified.  No surgical drains identified.  No mass, adenopathy, ascites, or acute osseous findings.  IMPRESSION: Sigmoid diverticulitis.  Abscess collection within the lower central pelvis which communicates with the GI tract, containing GI contrast, measuring in maximal dimensions 7.4 cm CC x 3.4 cm transverse x 2.5 cm AP.  Free intraperitoneal air in LEFT pelvis ; this could be from drainage catheters if they were recently removed or from continued intraperitoneal bowel perforation/leak.  Findings called to Dr. Leo Grosser on 09/30/2013 at 1533 hours.   Electronically Signed   By: Lavonia Dana M.D.   On: 09/30/2013 15:36    Dg Chest Portable 1 View 09/30/2013   CLINICAL DATA:  Abdominal pain.  Diarrhea.  EXAM: PORTABLE CHEST - 1 VIEW  COMPARISON:  06/11/2012  FINDINGS: AICD in place. PICC in good position in the superior vena cava above the cavoatrial junction 2 cm below the carina.  Heart  size and pulmonary vascularity are normal. Lungs are clear. No acute osseous abnormality.  IMPRESSION: No acute abnormalities.   Electronically Signed   By: Rozetta Nunnery M.D.   On: 09/30/2013 19:19    Results for orders placed in visit on 09/29/13  COMPREHENSIVE METABOLIC PANEL      Result Value Ref Range   Sodium 132 (*) 135 - 145 mEq/L   Potassium 5.2 (*) 3.5 - 5.1 mEq/L   Chloride 94 (*) 96 - 112 mEq/L   CO2 26  19 - 32 mEq/L   Glucose, Bld 99  70 - 99 mg/dL   BUN 27 (*) 6 - 23 mg/dL   Creatinine, Ser 1.1  0.4 - 1.2 mg/dL   Total Bilirubin 0.3  0.2 - 1.2 mg/dL   Alkaline Phosphatase 191 (*) 39 - 117 U/L   AST 16  0 - 37 U/L   ALT 21  0 - 35 U/L   Total Protein 8.5 (*) 6.0 - 8.3 g/dL   Albumin 3.6  3.5 - 5.2 g/dL   Calcium 10.2  8.4 - 10.5 mg/dL   GFR 51.39 (*) >60.00 mL/min  CBC WITH DIFFERENTIAL      Result Value Ref Range   WBC 16.2 (*) 4.0 - 10.5 K/uL   RBC 3.97  3.87 - 5.11 Mil/uL   Hemoglobin  11.6 (*) 12.0 - 15.0 g/dL   HCT 35.0 (*) 36.0 - 46.0 %   MCV 88.2  78.0 - 100.0 fl   MCHC 33.1  30.0 - 36.0 g/dL   RDW 13.1  11.5 - 15.5 %   Platelets 607.0 (*) 150.0 - 400.0 K/uL   Neutrophils Relative % 66.6  43.0 - 77.0 %   Lymphocytes Relative 23.6  12.0 - 46.0 %   Monocytes Relative 7.6  3.0 - 12.0 %   Eosinophils Relative 1.0  0.0 - 5.0 %   Basophils Relative 1.2  0.0 - 3.0 %   Neutro Abs 10.8 (*) 1.4 - 7.7 K/uL   Lymphs Abs 3.8  0.7 - 4.0 K/uL   Monocytes Absolute 1.2 (*) 0.1 - 1.0 K/uL   Eosinophils Absolute 0.2  0.0 - 0.7 K/uL   Basophils Absolute 0.2 (*) 0.0 - 0.1 K/uL    1715:  T/C to General Surgery Dr. Grandville Silos, case discussed, including:  HPI, pertinent PM/SHx, VS/PE, dx testing, ED course and treatment:  Agreeable to consult, requests to start IV invanz, admit to medicine service.   2000:  Pt requesting to access PICC only for her labs; refuses peripheral lab draw. Today's labs pending. Yesterday's labs as above. Dx and testing d/w pt and family.  Questions  answered.  Verb understanding, agreeable to admit. T/C to [facility] Dr. Hal Hope, case discussed, including:  HPI, pertinent PM/SHx, VS/PE, dx testing, ED course and treatment:  Agreeable to admit, requests to write temporary orders, obtain medical bed to team 10.     Alfonzo Feller, DO 10/03/13 [id number]

## 2013-09-30 NOTE — Telephone Encounter (Signed)
Ashley [name] with [facility] said pt was started on meropenem 1 gm on 09/26/13 and will have enough med until 10/02/13 which will be 7 days. Instructions for meropenem was to administer for 7 - 10 days. Pt has pic line. If Dr Danise Mina wants pt to have more than 7 days of meropenem would need to contact [facility] in [place] at [phone]. Pt was D/C from [facility] in [place], [place].Please advise.

## 2013-09-30 NOTE — Assessment & Plan Note (Signed)
Check Cr and K today.  CKD since 05/2011.

## 2013-09-30 NOTE — ED Notes (Signed)
Spoke with phlebotomy to inform them that patient is willing to be stuck for blood cultures.

## 2013-09-30 NOTE — ED Notes (Signed)
Pt has hx of diverticulitis and had surgery 5/1 to have drains placed. Had elevated wbc and went home with picc line right arm and iv antibiotics. Pt still having pain and frequent bowel movments. Went to pcp today for recheck and sent for repeat ct scan, told to come here immediately for abscess and fistula present on scan. Dr Zella Richer aware of pt and needs to be paged.

## 2013-09-30 NOTE — Assessment & Plan Note (Addendum)
Recent hospitalization at [facility] for ruptured sigmoid diverticulitis complicated by abscess s/p drainage and currently on IV meropenem to complete 10d course. Prior to d/c WBC spiked to 24k, will recheck CBC today to further eval for possible recurrent abscess with low threshold to obtain f/u CT scan. Each day slowly recovering. Will schedule f/u with local surgeon as well. Pt agrees with plan  Oxycodone 5mg  IR refilled today #30.

## 2013-10-01 ENCOUNTER — Telehealth: Payer: Self-pay | Admitting: *Deleted

## 2013-10-01 ENCOUNTER — Ambulatory Visit (INDEPENDENT_AMBULATORY_CARE_PROVIDER_SITE_OTHER): Payer: Federal, State, Local not specified - PPO | Admitting: General Surgery

## 2013-10-01 ENCOUNTER — Encounter (INDEPENDENT_AMBULATORY_CARE_PROVIDER_SITE_OTHER): Payer: Self-pay

## 2013-10-01 DIAGNOSIS — D649 Anemia, unspecified: Secondary | ICD-10-CM | POA: Diagnosis not present

## 2013-10-01 DIAGNOSIS — I509 Heart failure, unspecified: Secondary | ICD-10-CM | POA: Diagnosis not present

## 2013-10-01 DIAGNOSIS — K651 Peritoneal abscess: Secondary | ICD-10-CM | POA: Diagnosis not present

## 2013-10-01 DIAGNOSIS — Z01810 Encounter for preprocedural cardiovascular examination: Secondary | ICD-10-CM | POA: Diagnosis not present

## 2013-10-01 DIAGNOSIS — I428 Other cardiomyopathies: Secondary | ICD-10-CM

## 2013-10-01 DIAGNOSIS — K5732 Diverticulitis of large intestine without perforation or abscess without bleeding: Secondary | ICD-10-CM | POA: Diagnosis not present

## 2013-10-01 DIAGNOSIS — K630 Abscess of intestine: Secondary | ICD-10-CM

## 2013-10-01 DIAGNOSIS — F3289 Other specified depressive episodes: Secondary | ICD-10-CM

## 2013-10-01 DIAGNOSIS — F410 Panic disorder [episodic paroxysmal anxiety] without agoraphobia: Secondary | ICD-10-CM | POA: Diagnosis not present

## 2013-10-01 DIAGNOSIS — F329 Major depressive disorder, single episode, unspecified: Secondary | ICD-10-CM

## 2013-10-01 LAB — COMPREHENSIVE METABOLIC PANEL
ALT: 17 U/L (ref 0–35)
AST: 16 U/L (ref 0–37)
Albumin: 3.2 g/dL — ABNORMAL LOW (ref 3.5–5.2)
Alkaline Phosphatase: 190 U/L — ABNORMAL HIGH (ref 39–117)
BUN: 20 mg/dL (ref 6–23)
CO2: 23 mEq/L (ref 19–32)
Calcium: 9.5 mg/dL (ref 8.4–10.5)
Chloride: 98 mEq/L (ref 96–112)
Creatinine, Ser: 0.82 mg/dL (ref 0.50–1.10)
GFR calc Af Amer: 83 mL/min — ABNORMAL LOW (ref >90)
GFR calc non Af Amer: 72 mL/min — ABNORMAL LOW (ref >90)
Glucose, Bld: 94 mg/dL (ref 70–99)
Potassium: 4.9 mEq/L (ref 3.7–5.3)
Sodium: 134 mEq/L — ABNORMAL LOW (ref 137–147)
Total Bilirubin: 0.2 mg/dL — ABNORMAL LOW (ref 0.3–1.2)
Total Protein: 7.4 g/dL (ref 6.0–8.3)

## 2013-10-01 LAB — CBC WITH DIFFERENTIAL/PLATELET
Basophils Absolute: 0 10*3/uL (ref 0.0–0.1)
Basophils Relative: 0 % (ref 0–1)
Eosinophils Absolute: 0.1 10*3/uL (ref 0.0–0.7)
Eosinophils Relative: 1 % (ref 0–5)
HCT: 31.5 % — ABNORMAL LOW (ref 36.0–46.0)
Hemoglobin: 10.1 g/dL — ABNORMAL LOW (ref 12.0–15.0)
Lymphocytes Relative: 25 % (ref 12–46)
Lymphs Abs: 2.8 10*3/uL (ref 0.7–4.0)
MCH: 28.7 pg (ref 26.0–34.0)
MCHC: 32.1 g/dL (ref 30.0–36.0)
MCV: 89.5 fL (ref 78.0–100.0)
Monocytes Absolute: 1.1 10*3/uL — ABNORMAL HIGH (ref 0.1–1.0)
Monocytes Relative: 10 % (ref 3–12)
Neutro Abs: 7.2 10*3/uL (ref 1.7–7.7)
Neutrophils Relative %: 65 % (ref 43–77)
Platelets: 482 10*3/uL — ABNORMAL HIGH (ref 150–400)
RBC: 3.52 MIL/uL — ABNORMAL LOW (ref 3.87–5.11)
RDW: 13.2 % (ref 11.5–15.5)
WBC: 11.1 10*3/uL — ABNORMAL HIGH (ref 4.0–10.5)

## 2013-10-01 LAB — SURGICAL PCR SCREEN
MRSA, PCR: NEGATIVE
Staphylococcus aureus: NEGATIVE

## 2013-10-01 LAB — GLUCOSE, CAPILLARY
Glucose-Capillary: 105 mg/dL — ABNORMAL HIGH (ref 70–99)
Glucose-Capillary: 94 mg/dL (ref 70–99)
Glucose-Capillary: 93 mg/dL (ref 70–99)
Glucose-Capillary: 82 mg/dL (ref 70–99)

## 2013-10-01 MED ORDER — SODIUM CHLORIDE 0.9 % IJ SOLN
10.0000 mL | INTRAMUSCULAR | Status: DC | PRN
  Administered 2013-10-01: 30 mL
  Administered 2013-10-04 – 2013-10-06 (×3): 10 mL

## 2013-10-01 NOTE — Consult Note (Signed)
CARDIOLOGY CONSULT NOTE   Patient ID: Ashley [name] MRN: [id number], DOB/AGE: 1946-01-13   Admit date: 09/30/2013 Date of Consult: 10/01/2013  Primary Physician: [name], MD Primary Cardiologist: Dr [name]  Reason for consult:  Preoperative evaluation   Problem List  Past Medical History  Diagnosis Date  . Migraine without aura, without mention of intractable migraine without mention of status migrainosus   . History of diverticulitis of colon   . Pure hypercholesterolemia   . Irritable bowel syndrome   . Laryngeal nodule     Dr. Thomasena Edis  . Fibrocystic breast disease   . Osteoarthritis   . Hemorrhoids   . Anxiety   . GERD (gastroesophageal reflux disease)   . Systolic CHF     EF 40-98%, LV dilation, significant MR, goal weight 150lbs  . Nonischemic cardiomyopathy   . History of pneumonia 2012  . Mitral regurgitation   . Anxiety   . Bronchiectasis     possibly mild, treat URIs aggressively  . Hypertension   . Heart murmur   . Angina   . Lung disease 07/14/11  . Sleep apnea   . Shortness of breath     "resting"  . Anemia   . Fibromyalgia   . Depression   . Disorder of vocal cords   . Chronic sinusitis   . Insomnia   . Osteoporosis 01/2013    Past Surgical History  Procedure Laterality Date  . Appendectomy    . Mandible surgery      "got about 6 pins in the bottom of my jaw"  . Knee arthroscopy w/ orif      Left; "meniscus tear"  . Osteotomy      Left foot  . 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 Dexa  03/28/99    osteopenia L/S  . Colonoscopy  1998    nml (sigmoid-Gilbert-nol)  . Head mri, head ct  10/1998    ? H/A focus ([facility])  . Emg/mcv  10/16/01    + mild carpal tunnel  . Dexa  03/12/03    2.2 Spine 0.1 Hip  Osteopenia  . Mri right hip  12/12/01    Tendonitis Gluteus Medius to Gtr Trochanter Neg Bursitis (Dr. Eulas Post)  . L/s films  11/21/01    nml; Right hip nml  . Mri u/s  11/21/01    min. DDD L/3-4  . Colonoscopy   01/04/04    polyps, diverticulosis/severe  . Dexa  11/14/05    1.9 Spine 0.3 Hip  slight improvement  . Flex laryngoscopy  06/11/06    ([facility]) nml  . Dexa  11/22/06    improved osteo and fem neck  . [facility] bunionectomy  11/04/08    Right Great Toe (Dr. Beola Cord)  . Breast mass excision  01/2010    Left-fibrocystic change w/intraductal papilloma, no malignancy  . Ct chest  12/2010    possible bronchiectasis lower lobes, 02/2011 - enlarged bilateral effusions, bibasilar atx  . Stress myoview  02/2011    no significant ischemia  . Ct chest  04/2011    no PE.  abnormal R hilar and mediastinal adenopathy --> on rpt 05/2011, resolved  . US echocardiography  04/2011    mildly dilated LV, EF 25-30%, hypokinesis, restrictive physiology, mod MR, no AS  . Icd placement  07/14/11    w/pacemaker  . Tonsillectomy and adenoidectomy    . Breast biopsy      "I've had 2-3 bx; all  benign"  . Augmentation mammaplasty    . 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 Abdominal hysterectomy  1987    w/BSO  . Lasik      left eye  . Knee surgery      left  . Mandible surgery    . Sinus surgery      x3 with balloon   . Dexa  01/2013    T score -2.6 at spine     Allergies  Allergies  Allergen Reactions  . Antihistamines, Chlorpheniramine-Type Other (See Comments)    Makes her eyes look like she sees strobe lights  . Influenza Vac Split [Flu Virus Vaccine] Other (See Comments)    "allergic to concentrated eggs that are in vaccine"  . Penicillins     REACTION: "told 50 years ago I couldn't take it; might be immune to it", able to take amoxicillin  . Sulfasalazine Other (See Comments)    "makes my whole body smell like sulfa & makes me sick just smelling it"  . Atorvastatin Other (See Comments)    Severe muscle cramps to generic atorvastatin.  Able to take brand lipitor.  . Latex Nausea Only and Rash  . Rosuvastatin Other (See Comments)    Was not effective  controlling lipids  . Chlorhexidine Other (See Comments)    blisters  . Cyclobenzaprine Other (See Comments)    Adverse reaction - back and throat pain, dizziness, exhaustion  . Cymbalta [Duloxetine Hcl] Other (See Comments)    "body spasms and made me feel weird in my head"   . Metaxalone   . Prevacid [Lansoprazole] Other (See Comments)    Worsened GI side effects  . Red Dye     Nausea, swelling    HPI   Ashley [name] is a 68 y.o. female history of cardiomyopathy and chronic kidney disease was recently admitted at [facility] in [place] for ruptured diverticulitis with abscess. Patient initially had laparoscopic washout and drain placed followed by percutaneous drain placement once patient was found to have persistent abscess. Last week patient's drain was removed and last Friday 5 days ago patient was discharged home on imipenem with PICC line. Patient followed with primary care physician yesterday and was found to have persistent leukocytosis and primary care physician ordered CT abdomen and pelvis which shows abscess connecting to colon. Dr. Georganna Skeans was consulted and at this time patient has been admitted for further management. Patient has had at least 4 bouts of diarrhea after patient had contrast. Has mild left lower quadrant pain. Denies any nausea vomiting. Denies any fever chills or chest pain.   The patient has a h/o NICMP with LVEF 25-30% in 2012 (no ischemia on Lexiscan nuclear stress test) and s/p ICD placement. She had progressive improvement of LVEF with the last echo in July 2014 showing LVEF 50-55%. She has been followed by Dr [name], the last seen in January 2015. She has been managed with diuretics with baseline weight of 162 lbs and was euvolemic at the last visit.  She denies CP, SOB, palpitations, ICD firing, orthopnea, or PND. She is limitred in physical activities as her abdominal pain would limit her, however she is still able to go to the gym for a  light exercise.   Inpatient Medications  . ertapenem  1 g Intravenous Q24H  . metoprolol  2.5 mg Intravenous 3 times per day    Family History Family  History  Problem Relation Age of Onset  . Lung cancer Father     + smoker  . Dementia Mother   . Osteoporosis Mother     Lumbar spine  . Stroke Mother     x 5 @ 36 YOA  . Arthritis Mother     hands  . Emphysema Mother   . Alcohol abuse Paternal Grandfather   . Hypertension Maternal Grandfather     ?  . Stroke Paternal Grandfather     ?  Marland Kitchen Heart disease      grandmother  . Heart disease      grandfather     Social History History   Social History  . Marital Status: Married    Spouse Name: N/A    Number of Children: 1  . Years of Education: N/A   Occupational History  . 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 [employer]    Social History Main Topics  . Smoking status: Never Smoker   . Smokeless tobacco: Never Used     Comment: Lived with smokers x 23 yrs, smoked herself "for a week"  . Alcohol Use: No  . Drug Use: No  . Sexual Activity: Not Currently   Other Topics Concern  . Not on file   Social History Narrative   1 adopted child      Lives with husband              Review of Systems  General:  No chills, fever, night sweats or weight changes.  Cardiovascular:  No chest pain, dyspnea on exertion, edema, orthopnea, palpitations, paroxysmal nocturnal dyspnea. Dermatological: No rash, lesions/masses Respiratory: No cough, dyspnea Urologic: No hematuria, dysuria Abdominal:   No nausea, vomiting, diarrhea, bright red blood per rectum, melena, or hematemesis Neurologic:  No visual changes, wkns, changes in mental status. All other systems reviewed and are otherwise negative except as noted above.  Physical Exam  Blood pressure 114/54, pulse 76, temperature 98.4 F (36.9 C), temperature source Oral, resp. rate 18, height 5\' 5"  (1.651 m), weight 170 lb 11.2 oz (77.429 kg), SpO2  97.00%.  General: Pleasant, NAD Psych: Normal affect. Neuro: Alert and oriented X 3. Moves all extremities spontaneously. HEENT: Normal  Neck: Supple without bruits or JVD. Lungs:  Resp regular and unlabored, CTA. Heart: RRR no s3, s4, or murmurs. Abdomen: Soft, non-tender, non-distended, BS + x 4.  Extremities: No clubbing, cyanosis or edema. DP/PT/Radials 2+ and equal bilaterally.  Labs  No results found for this basename: CKTOTAL, CKMB, TROPONINI,  in the last 72 hours Lab Results  Component Value Date   WBC 11.1* 10/01/2013   HGB 10.1* 10/01/2013   HCT 31.5* 10/01/2013   MCV 89.5 10/01/2013   PLT 482* 10/01/2013    Recent Labs Lab 10/01/13 0540  NA 134*  K 4.9  CL 98  CO2 23  BUN 20  CREATININE 0.82  CALCIUM 9.5  PROT 7.4  BILITOT 0.2*  ALKPHOS 190*  ALT 17  AST 16  GLUCOSE 94   Lab Results  Component Value Date   CHOL 218* 02/16/2012   HDL 55.30 02/16/2012   LDLCALC 82 01/24/2011   TRIG 173.0* 02/16/2012   Radiology/Studies  Ct Abdomen Pelvis W Contrast  IMPRESSION: Sigmoid diverticulitis.  Abscess collection within the lower central pelvis which communicates with the GI tract, containing GI contrast, measuring in maximal dimensions 7.4 cm CC x 3.4 cm transverse x 2.5 cm AP.  Free intraperitoneal air in LEFT  pelvis ; this could be from drainage catheters if they were recently removed or from continued intraperitoneal bowel perforation/leak.  Findings called to Dr. Leo Grosser on 09/30/2013 at 1533 hours.    Dg Chest Portable 1 View  09/30/2013   CLINICAL DATA:  Abdominal pain.  Diarrhea.  EXAM: PORTABLE CHEST - 1 VIEW  COMPARISON:  06/11/2012  FINDINGS: AICD in place. PICC in good position in the superior vena cava above the cavoatrial junction 2 cm below the carina.  Heart size and pulmonary vascularity are normal. Lungs are clear. No acute osseous abnormality.  IMPRESSION: No acute abnormalities.   Electronically Signed   By: Rozetta Nunnery M.D.   On: 09/30/2013 19:19     Echocardiogram : 02/18/2013 Left ventricle: The cavity size was normal. Wall thickness was normal. Systolic function was normal. The estimated ejection fraction was in the range of 50% to 55%. Wall motion was normal; there were no regional wall motion abnormalities. There was an increased relative contribution of atrial contraction to ventricular filling. - Aortic valve: Valve area: 1.78cm^2(VTI). Valve area: 1.65cm^2 (Vmax).  ECG: NSR, normal ECG    ASSESSMENT AND PLAN  A 68 year old female with h/o non-ischemic CMP with LVEF last estimated at 50%. She has recurrent abdominal abscess that requires surgical removal under general anesthesia. ECG is normal.  The patient is currently euvolemic and has no signs of decompensated heart failure. She can certainly achieve at least 4 METS. She is currently considered a low risk for an intermediate risk surgery. It is important to avoid fluid overload with her h/o chronic CHF. We would also recommend to continue using betablockers in the perioperative period - carvedilol 6.25 mg po BID. We will follow after the surgery.     Signed, Dorothy Spark, MD, [facility] 10/01/2013, 3:39 PM

## 2013-10-01 NOTE — Progress Notes (Signed)
[facility]                                                                              Patient Demographics  Ashley [name], is a 68 y.o. female, DOB - Mar 06, 1946, ZOX:[id number]  Admit date - 09/30/2013   Admitting Physician Eduard Clos, MD  Outpatient Primary MD for the patient is [name], MD  LOS - 1   Chief Complaint  Patient presents with  . Abdominal Pain  . Diarrhea     HPI: Ashley [name] is a 68 y.o. female history of cardiomyopathy and chronic kidney disease was recently admitted at [facility] in [place] for ruptured diverticulitis with abscess. Patient initially had laparoscopic washout and drain placed followed by percutaneous drain placement once patient was found to have persistent abscess. Last week patient's drain was removed and last Friday 5 days ago patient was discharged home on imipenem with PICC line. Patient followed with primary care physician yesterday and was found to have persistent leukocytosis and primary care physician ordered CT abdomen and pelvis which shows abscess connecting to colon. Dr. Violeta Gelinas was consulted and at this time patient has been admitted for further management. Patient has had at least 4 bouts of diarrhea after patient had contrast. Has mild left lower quadrant pain. Denies any nausea vomiting. Denies any fever chills or chest pain.    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 -Status post diagnostic laparoscopy with abdominal washout and drain placement the [place] -Patient had percutaneous drain with drain removal before admission -General surgery consulted -Interventional radiology unable to access this access for percutaneous drain -Will obtain cardiology clearance for possible surgical intervention -Continue ertapenem (patient was placed on imipenem as an outpatient through PICC line)  History of cardiomyopathy status post ICD placement -Last  echocardiogram in October of 2014 shows an EF of 50-55% -Will continue to monitor patient's daily weights as well as intake and output -Currently n.p.o. do to perforated diverticulitis  Chronic kidney disease, stage 3-4 -Currently creatinine 0.82 appears to be below baseline -Will continue to monitor BMP  Normocytic Anemia -Baseline hemoglobin appears to be well -Currently 10.1, will continue to monitor CBC  Thrombocytosis -Likely reactive -Admission, 607, currently trending downward 482  Depression/Anxiety -Currently holding xanax  Hypertension -Continue metoprolol IV -Holding home medications of Coreg and spironolactone  Code Status: Full  Family Communication: None at bedside  Disposition Plan: Admitted  Time Spent in minutes   30 minutes  Procedures none  Consults   General surgery Cardiology  DVT Prophylaxis  SCDs  Lab Results  Component Value Date   PLT 482* 10/01/2013    Medications  Scheduled Meds: . ertapenem  1 g Intravenous Q24H  . metoprolol  2.5 mg Intravenous 3 times per day   Continuous Infusions: . sodium chloride 40 mL/hr at 09/30/13 2253   PRN Meds:.acetaminophen, acetaminophen, ondansetron (ZOFRAN) IV, ondansetron, sodium chloride  Antibiotics    Anti-infectives   Start     Dose/Rate Route Frequency Ordered Stop   09/30/13 2200  ertapenem (INVANZ) 1 g in sodium chloride 0.9 % 50 mL IVPB     1 g 100 mL/hr  over 30 Minutes Intravenous Every 24 hours 09/30/13 2147     09/30/13 1745  ertapenem (INVANZ) 1 g in sodium chloride 0.9 % 50 mL IVPB     1 g 100 mL/hr over 30 Minutes Intravenous  Once 09/30/13 1732 09/30/13 2045      Subjective:   [facility] seen and examined today.  Patient states her abdominal pain is controlled with Tylenol. Denies any nausea or vomiting at this time. Inquires about possible surgery. Denies any chest pain or shortness of breath at this time.  Objective:   Filed Vitals:   09/30/13 2213 10/01/13  0041 10/01/13 0104 10/01/13 0551  BP: 109/63  108/45 120/60  Pulse:   88 73  Temp:   98.3 F (36.8 C) 98.5 F (36.9 C)  TempSrc:  Oral Oral Oral  Resp:   16 18  Height:      Weight:      SpO2:   94% 98%    Wt Readings from Last 3 Encounters:  09/30/13 77.429 kg (170 lb 11.2 oz)  09/29/13 71.101 kg (156 lb 12 oz)  08/20/13 77.565 kg (171 lb)     Intake/Output Summary (Last 24 hours) at 10/01/13 0851 Last data filed at 10/01/13 9562  Gross per 24 hour  Intake    310 ml  Output      0 ml  Net    310 ml    Exam  General: Well developed, well nourished, NAD, appears stated age  HEENT: NCAT, PERRLA, EOMI, Anicteic Sclera, mucous membranes moist.  Neck: Supple, no JVD, no masses  Cardiovascular: S1 S2 auscultated, no rubs, murmurs or gallops. Regular rate and rhythm.  Respiratory: Clear to auscultation bilaterally with equal chest rise  Abdomen: Soft, LLQ tenderness, nondistended, + bowel sounds, prior healed surgical incisions  Extremities: warm dry without cyanosis clubbing or edema  Neuro: AAOx3, cranial nerves grossly intact. Strength 5/5 in patient's upper and lower extremities bilaterally  Skin: Without rashes exudates or nodules\  Psych: Somewhat anxious, however appropriate mood and affect  Data Review   Micro Results No results found for this or any previous visit (from the past 240 hour(s)).  Radiology Reports Ct Abdomen Pelvis W Contrast  09/30/2013   CLINICAL DATA:  Sigmoid diverticulitis, followup, recent laparoscopic surgery with 3 abscess drains at an outside facility.  EXAM: CT ABDOMEN AND PELVIS WITH CONTRAST  TECHNIQUE: Multidetector CT imaging of the abdomen and pelvis was performed using the standard protocol following bolus administration of intravenous contrast. Sagittal and coronal MPR images reconstructed from axial data set.  CONTRAST:  80mL OMNIPAQUE IOHEXOL 300 MG/ML SOLN IV. Dilute oral contrast.  COMPARISON:  None  FINDINGS: Dependent  atelectasis at both lung bases.  AICD lead in RIGHT ventricle.  Small diaphragmatic defect at posterior medial LEFT diaphragm containing fat.  Tiny BILATERAL renal cysts.  Liver, spleen, pancreas, kidneys, and adrenal glands otherwise normal.  Uterus surgically absent with nonvisualization of ovaries and appendix.  Bladder decompressed.  Extraluminal collection of gas, fluid and contrast identified in central pelvis, superior to and extending to the urinary bladder, located between small bowel loops and sigmoid colon, measuring approximately 7.4 cm in length, up to 3.4 cm transverse and 2.5 cm AP.  Finding is compatible with a abscess collection which communicates with the GI tract.  Free intraperitoneal identified in the LEFT pelvis extending from image 69 through image 77.  Diverticulosis of the distal descending and sigmoid colon with sigmoid wall thickening and persistent pericolic inflammatory  changes at the sigmoid mesocolon.  Stomach and remaining bowel loops unremarkable.  No additional abscess collections identified.  No surgical drains identified.  No mass, adenopathy, ascites, or acute osseous findings.  IMPRESSION: Sigmoid diverticulitis.  Abscess collection within the lower central pelvis which communicates with the GI tract, containing GI contrast, measuring in maximal dimensions 7.4 cm CC x 3.4 cm transverse x 2.5 cm AP.  Free intraperitoneal air in LEFT pelvis ; this could be from drainage catheters if they were recently removed or from continued intraperitoneal bowel perforation/leak.  Findings called to Dr. Patrice Paradise on 09/30/2013 at 1533 hours.   Electronically Signed   By: Ulyses Southward M.D.   On: 09/30/2013 15:36   Dg Chest Portable 1 View  09/30/2013   CLINICAL DATA:  Abdominal pain.  Diarrhea.  EXAM: PORTABLE CHEST - 1 VIEW  COMPARISON:  06/11/2012  FINDINGS: AICD in place. PICC in good position in the superior vena cava above the cavoatrial junction 2 cm below the carina.  Heart size and  pulmonary vascularity are normal. Lungs are clear. No acute osseous abnormality.  IMPRESSION: No acute abnormalities.   Electronically Signed   By: Geanie Cooley M.D.   On: 09/30/2013 19:19    CBC  Recent Labs Lab 09/29/13 1516 09/30/13 1705 10/01/13 0540  WBC 16.2* 12.5* 11.1*  HGB 11.6* 11.2* 10.1*  HCT 35.0* 33.9* 31.5*  PLT 607.0* 553* 482*  MCV 88.2 88.3 89.5  MCH  --  29.2 28.7  MCHC 33.1 33.0 32.1  RDW 13.1 13.1 13.2  LYMPHSABS 3.8 3.2 2.8  MONOABS 1.2* 1.1* 1.1*  EOSABS 0.2 0.1 0.1  BASOSABS 0.2* 0.0 0.0    Chemistries   Recent Labs Lab 09/29/13 1516 09/30/13 1705 10/01/13 0540  NA 132* 131* 134*  K 5.2* 4.9 4.9  CL 94* 94* 98  CO2 26 23 23   GLUCOSE 99 97 94  BUN 27* 24* 20  CREATININE 1.1 0.90 0.82  CALCIUM 10.2 9.8 9.5  AST 16 17 16   ALT 21 19 17   ALKPHOS 191* 197* 190*  BILITOT 0.3 0.2* 0.2*   ------------------------------------------------------------------------------------------------------------------ estimated creatinine clearance is 67.6 ml/min (by C-G formula based on Cr of 0.82). ------------------------------------------------------------------------------------------------------------------ No results found for this basename: HGBA1C,  in the last 72 hours ------------------------------------------------------------------------------------------------------------------ No results found for this basename: CHOL, HDL, LDLCALC, TRIG, CHOLHDL, LDLDIRECT,  in the last 72 hours ------------------------------------------------------------------------------------------------------------------ No results found for this basename: TSH, T4TOTAL, FREET3, T3FREE, THYROIDAB,  in the last 72 hours ------------------------------------------------------------------------------------------------------------------ No results found for this basename: VITAMINB12, FOLATE, FERRITIN, TIBC, IRON, RETICCTPCT,  in the last 72 hours  Coagulation profile No results found  for this basename: INR, PROTIME,  in the last 168 hours  No results found for this basename: DDIMER,  in the last 72 hours  Cardiac Enzymes No results found for this basename: CK, CKMB, TROPONINI, MYOGLOBIN,  in the last 168 hours ------------------------------------------------------------------------------------------------------------------ No components found with this basename: POCBNP,     Tersa Fotopoulos D.O. on 10/01/2013 at 8:51 AM  Between 7am to 7pm - Pager - [phone]  After 7pm go to [email or web address] - password [email or web address]  And look for the night coverage person covering for me after hours  [facility] Office  [phone]

## 2013-10-01 NOTE — Progress Notes (Signed)
Appreciate Cardiology evaluation for this patient  Persistent diverticular perforation with abscess s/p attempted laparoscopic washout with drainage/ percutaneous drainage.    Recommend exploratory laparotomy/ sigmoid colectomy/ descending colostomy if cleared by Cardiology.  Will plan for tomorrow.  The surgical procedure has been discussed with the patient.  Potential risks, benefits, alternative treatments, and expected outcomes have been explained.  All of the patient's questions at this time have been answered.  The likelihood of reaching the patient's treatment goal is good.  The patient understand the proposed surgical procedure and wishes to proceed.   Imogene Burn. Georgette Dover, MD, [facility]  General/ Trauma Surgery  10/01/2013 5:06 PM

## 2013-10-01 NOTE — Progress Notes (Signed)
Patient ID: Ashley [name], female   DOB: 07-Dec-1945, 68 y.o.   MRN: [id number]    Subjective: Pt feels ok today.  No major complaints of pain that tylenol didn't make better.  Has been getting IV abx therapy at home since discharge from [facility].  Objective: Vital signs in last 24 hours: Temp:  [97.7 F (36.5 C)-98.5 F (36.9 C)] 98.5 F (36.9 C) (05/20 0551) Pulse Rate:  [73-88] 73 (05/20 0551) Resp:  [13-22] 18 (05/20 0551) BP: (105-127)/(45-75) 120/60 mmHg (05/20 0551) SpO2:  [94 %-99 %] 98 % (05/20 0551) Weight:  [156 lb (70.761 kg)-170 lb 11.2 oz (77.429 kg)] 170 lb 11.2 oz (77.429 kg) (05/19 2139) Last BM Date: 09/30/13  Intake/Output from previous day: 05/19 0701 - 05/20 0700 In: 310 [I.V.:310] Out: -  Intake/Output this shift:    PE: Abd: soft, mild LLQ tenderness, +BS, ND, prior incisions are well healed.  Lab Results:   Recent Labs  09/30/13 1705 10/01/13 0540  WBC 12.5* 11.1*  HGB 11.2* 10.1*  HCT 33.9* 31.5*  PLT 553* 482*   BMET  Recent Labs  09/30/13 1705 10/01/13 0540  NA 131* 134*  K 4.9 4.9  CL 94* 98  CO2 23 23  GLUCOSE 97 94  BUN 24* 20  CREATININE 0.90 0.82  CALCIUM 9.8 9.5   PT/INR No results found for this basename: LABPROT, INR,  in the last 72 hours CMP     Component Value Date/Time   NA 134* 10/01/2013 0540   NA 143 03/10/2013 1300   K 4.9 10/01/2013 0540   CL 98 10/01/2013 0540   CO2 23 10/01/2013 0540   GLUCOSE 94 10/01/2013 0540   GLUCOSE 105* 03/10/2013 1300   BUN 20 10/01/2013 0540   BUN 17 03/10/2013 1300   CREATININE 0.82 10/01/2013 0540   CREATININE 2.16* 04/01/2012 1740   CALCIUM 9.5 10/01/2013 0540   PROT 7.4 10/01/2013 0540   PROT 7.5 09/13/2012 1449   ALBUMIN 3.2* 10/01/2013 0540   AST 16 10/01/2013 0540   ALT 17 10/01/2013 0540   ALKPHOS 190* 10/01/2013 0540   BILITOT 0.2* 10/01/2013 0540   GFRNONAA 72* 10/01/2013 0540   GFRAA 83* 10/01/2013 0540   Lipase     Component Value Date/Time   LIPASE 55 09/30/2013 1705        Studies/Results: Ct Abdomen Pelvis W Contrast  09/30/2013   CLINICAL DATA:  Sigmoid diverticulitis, followup, recent laparoscopic surgery with 3 abscess drains at an outside facility.  EXAM: CT ABDOMEN AND PELVIS WITH CONTRAST  TECHNIQUE: Multidetector CT imaging of the abdomen and pelvis was performed using the standard protocol following bolus administration of intravenous contrast. Sagittal and coronal MPR images reconstructed from axial data set.  CONTRAST:  63mL OMNIPAQUE IOHEXOL 300 MG/ML SOLN IV. Dilute oral contrast.  COMPARISON:  None  FINDINGS: Dependent atelectasis at both lung bases.  AICD lead in RIGHT ventricle.  Small diaphragmatic defect at posterior medial LEFT diaphragm containing fat.  Tiny BILATERAL renal cysts.  Liver, spleen, pancreas, kidneys, and adrenal glands otherwise normal.  Uterus surgically absent with nonvisualization of ovaries and appendix.  Bladder decompressed.  Extraluminal collection of gas, fluid and contrast identified in central pelvis, superior to and extending to the urinary bladder, located between small bowel loops and sigmoid colon, measuring approximately 7.4 cm in length, up to 3.4 cm transverse and 2.5 cm AP.  Finding is compatible with a abscess collection which communicates with the GI tract.  Free intraperitoneal identified  in the LEFT pelvis extending from image 69 through image 77.  Diverticulosis of the distal descending and sigmoid colon with sigmoid wall thickening and persistent pericolic inflammatory changes at the sigmoid mesocolon.  Stomach and remaining bowel loops unremarkable.  No additional abscess collections identified.  No surgical drains identified.  No mass, adenopathy, ascites, or acute osseous findings.  IMPRESSION: Sigmoid diverticulitis.  Abscess collection within the lower central pelvis which communicates with the GI tract, containing GI contrast, measuring in maximal dimensions 7.4 cm CC x 3.4 cm transverse x 2.5 cm AP.  Free  intraperitoneal air in LEFT pelvis ; this could be from drainage catheters if they were recently removed or from continued intraperitoneal bowel perforation/leak.  Findings called to Dr. Leo Grosser on 09/30/2013 at 1533 hours.   Electronically Signed   By: Lavonia Dana M.D.   On: 09/30/2013 15:36   Dg Chest Portable 1 View  09/30/2013   CLINICAL DATA:  Abdominal pain.  Diarrhea.  EXAM: PORTABLE CHEST - 1 VIEW  COMPARISON:  06/11/2012  FINDINGS: AICD in place. PICC in good position in the superior vena cava above the cavoatrial junction 2 cm below the carina.  Heart size and pulmonary vascularity are normal. Lungs are clear. No acute osseous abnormality.  IMPRESSION: No acute abnormalities.   Electronically Signed   By: Rozetta Nunnery M.D.   On: 09/30/2013 19:19    Anti-infectives: Anti-infectives   Start     Dose/Rate Route Frequency Ordered Stop   09/30/13 2200  ertapenem (INVANZ) 1 g in sodium chloride 0.9 % 50 mL IVPB     1 g 100 mL/hr over 30 Minutes Intravenous Every 24 hours 09/30/13 2147     09/30/13 1745  ertapenem (INVANZ) 1 g in sodium chloride 0.9 % 50 mL IVPB     1 g 100 mL/hr over 30 Minutes Intravenous  Once 09/30/13 1732 09/30/13 2045       Assessment/Plan  1.  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 S/p diagnostic laparoscopy with abdominal washout and drain placement in [place] 3. Post op abscess, s/p perc drain, drain removed 4. Multiple medical problems  Plans: 1. Unfortunately, IR is unable to access this abscess for a perc drain.  Given her continues leak of contrast from her colon into her abscess, the patient will likely require surgical intervention, likely a colectomy/colostomy.  I have spoken to Dr. Ree Kida about cardiology clearance.  We are hopeful for OR possibly tomorrow.  I have tentatively laid this plan out for the patient.   2. Cont IV Invanz 3. No need for bowel prep given likelihood of colostomy. 4. Cont NPO   LOS: 1 day     Henreitta Cea 10/01/2013, 10:55 AM Pager: [phone]

## 2013-10-01 NOTE — Telephone Encounter (Signed)
filled out and placed in Ashley [name]'s box - plz send back. pt currently in the hospital - will await discharge prior to deciding on abx course.

## 2013-10-01 NOTE — Telephone Encounter (Signed)
Patient called and wanted Dr. [name] to know that she is in [facility] and she has abcess in her colon and has to have a drain. Has lost a lot of weight and is down to 156 lbs.

## 2013-10-02 ENCOUNTER — Ambulatory Visit: Payer: Medicare Other | Admitting: Cardiovascular Disease

## 2013-10-02 ENCOUNTER — Encounter (HOSPITAL_COMMUNITY): Admission: EM | Disposition: A | Discharge status: Home / Self Care | Payer: Self-pay | Source: Home / Self Care

## 2013-10-02 ENCOUNTER — Encounter (HOSPITAL_COMMUNITY): Payer: Self-pay | Admitting: Certified Registered Nurse Anesthetist

## 2013-10-02 ENCOUNTER — Encounter (HOSPITAL_COMMUNITY): Payer: Medicare Other | Admitting: Certified Registered Nurse Anesthetist

## 2013-10-02 ENCOUNTER — Inpatient Hospital Stay (HOSPITAL_COMMUNITY): Payer: Medicare Other | Admitting: Certified Registered Nurse Anesthetist

## 2013-10-02 DIAGNOSIS — I5022 Chronic systolic (congestive) heart failure: Secondary | ICD-10-CM | POA: Diagnosis not present

## 2013-10-02 DIAGNOSIS — I428 Other cardiomyopathies: Secondary | ICD-10-CM | POA: Diagnosis not present

## 2013-10-02 DIAGNOSIS — K651 Peritoneal abscess: Secondary | ICD-10-CM | POA: Diagnosis not present

## 2013-10-02 DIAGNOSIS — D649 Anemia, unspecified: Secondary | ICD-10-CM | POA: Diagnosis not present

## 2013-10-02 DIAGNOSIS — K219 Gastro-esophageal reflux disease without esophagitis: Secondary | ICD-10-CM | POA: Diagnosis not present

## 2013-10-02 DIAGNOSIS — I10 Essential (primary) hypertension: Secondary | ICD-10-CM | POA: Diagnosis not present

## 2013-10-02 DIAGNOSIS — N184 Chronic kidney disease, stage 4 (severe): Secondary | ICD-10-CM | POA: Diagnosis not present

## 2013-10-02 DIAGNOSIS — K5730 Diverticulosis of large intestine without perforation or abscess without bleeding: Secondary | ICD-10-CM | POA: Diagnosis not present

## 2013-10-02 DIAGNOSIS — K630 Abscess of intestine: Secondary | ICD-10-CM | POA: Diagnosis not present

## 2013-10-02 DIAGNOSIS — N183 Chronic kidney disease, stage 3 unspecified: Secondary | ICD-10-CM | POA: Diagnosis not present

## 2013-10-02 DIAGNOSIS — K631 Perforation of intestine (nontraumatic): Secondary | ICD-10-CM

## 2013-10-02 DIAGNOSIS — K5732 Diverticulitis of large intestine without perforation or abscess without bleeding: Secondary | ICD-10-CM | POA: Diagnosis not present

## 2013-10-02 HISTORY — PX: COLOSTOMY: SHX63

## 2013-10-02 HISTORY — PX: PARTIAL COLECTOMY: SHX5273

## 2013-10-02 LAB — CBC
HCT: 33.2 % — ABNORMAL LOW (ref 36.0–46.0)
Hemoglobin: 10.5 g/dL — ABNORMAL LOW (ref 12.0–15.0)
MCH: 28.5 pg (ref 26.0–34.0)
MCHC: 31.6 g/dL (ref 30.0–36.0)
MCV: 90 fL (ref 78.0–100.0)
Platelets: 495 10*3/uL — ABNORMAL HIGH (ref 150–400)
RBC: 3.69 MIL/uL — ABNORMAL LOW (ref 3.87–5.11)
RDW: 13 % (ref 11.5–15.5)
WBC: 9.7 10*3/uL (ref 4.0–10.5)

## 2013-10-02 LAB — BASIC METABOLIC PANEL
BUN: 17 mg/dL (ref 6–23)
CO2: 20 mEq/L (ref 19–32)
Calcium: 9.7 mg/dL (ref 8.4–10.5)
Chloride: 100 mEq/L (ref 96–112)
Creatinine, Ser: 0.75 mg/dL (ref 0.50–1.10)
GFR calc Af Amer: >90 mL/min (ref >90)
GFR calc non Af Amer: 85 mL/min — ABNORMAL LOW (ref >90)
Glucose, Bld: 90 mg/dL (ref 70–99)
Potassium: 4.8 mEq/L (ref 3.7–5.3)
Sodium: 137 mEq/L (ref 137–147)

## 2013-10-02 LAB — GLUCOSE, CAPILLARY
Glucose-Capillary: 103 mg/dL — ABNORMAL HIGH (ref 70–99)
Glucose-Capillary: 103 mg/dL — ABNORMAL HIGH (ref 70–99)

## 2013-10-02 SURGERY — COLECTOMY, PARTIAL
Anesthesia: General | Site: Abdomen

## 2013-10-02 MED ORDER — FENTANYL CITRATE 0.05 MG/ML IJ SOLN
INTRAMUSCULAR | Status: DC | PRN
Start: 2013-10-02 — End: 2013-10-02
  Administered 2013-10-02 (×2): 100 ug via INTRAVENOUS
  Administered 2013-10-02: 25 ug via INTRAVENOUS
  Administered 2013-10-02: 50 ug via INTRAVENOUS
  Administered 2013-10-02: 25 ug via INTRAVENOUS
  Administered 2013-10-02 (×2): 50 ug via INTRAVENOUS
  Administered 2013-10-02: 25 ug via INTRAVENOUS
  Administered 2013-10-02 (×2): 50 ug via INTRAVENOUS
  Administered 2013-10-02: 25 ug via INTRAVENOUS
  Administered 2013-10-02: 100 ug via INTRAVENOUS
  Administered 2013-10-02 (×2): 50 ug via INTRAVENOUS

## 2013-10-02 MED ORDER — VECURONIUM BROMIDE 10 MG IV SOLR
INTRAVENOUS | Status: DC | PRN
  Administered 2013-10-02: 3 mg via INTRAVENOUS
  Administered 2013-10-02 (×2): 2 mg via INTRAVENOUS

## 2013-10-02 MED ORDER — ALPRAZOLAM 0.25 MG PO TABS
0.2500 mg | ORAL_TABLET | Freq: Once | ORAL | Status: AC
Start: 2013-10-02 — End: 2013-10-03
  Administered 2013-10-03: 0.25 mg via ORAL
  Filled 2013-10-02: qty 1

## 2013-10-02 MED ORDER — OXYCODONE-ACETAMINOPHEN 5-325 MG PO TABS
1.0000 | ORAL_TABLET | ORAL | Status: DC | PRN
  Administered 2013-10-05: 1 via ORAL
  Filled 2013-10-02: qty 1

## 2013-10-02 MED ORDER — DEXAMETHASONE SODIUM PHOSPHATE 4 MG/ML IJ SOLN
INTRAMUSCULAR | Status: DC | PRN
  Administered 2013-10-02: 4 mg via INTRAVENOUS

## 2013-10-02 MED ORDER — 0.9 % SODIUM CHLORIDE (POUR BTL) OPTIME
TOPICAL | Status: DC | PRN
  Administered 2013-10-02 (×5): 1000 mL

## 2013-10-02 MED ORDER — FENTANYL CITRATE 0.05 MG/ML IJ SOLN
INTRAMUSCULAR | Status: AC
  Filled 2013-10-02: qty 5

## 2013-10-02 MED ORDER — PROPOFOL 10 MG/ML IV BOLUS
INTRAVENOUS | Status: AC
  Filled 2013-10-02: qty 20

## 2013-10-02 MED ORDER — DIPHENHYDRAMINE HCL 50 MG/ML IJ SOLN: 12.5000 mg | Freq: Four times a day (QID) | INTRAMUSCULAR | Status: DC | PRN

## 2013-10-02 MED ORDER — ONDANSETRON HCL 4 MG/2ML IJ SOLN
INTRAMUSCULAR | Status: AC
  Filled 2013-10-02: qty 2

## 2013-10-02 MED ORDER — ONDANSETRON HCL 4 MG/2ML IJ SOLN: 4.0000 mg | Freq: Four times a day (QID) | INTRAMUSCULAR | Status: DC | PRN

## 2013-10-02 MED ORDER — DIPHENHYDRAMINE HCL 12.5 MG/5ML PO ELIX: 12.5000 mg | ORAL_SOLUTION | Freq: Four times a day (QID) | ORAL | Status: DC | PRN

## 2013-10-02 MED ORDER — FENTANYL CITRATE 0.05 MG/ML IJ SOLN
50.0000 ug | Freq: Once | INTRAMUSCULAR | Status: AC
  Administered 2013-10-02: 50 ug via INTRAVENOUS

## 2013-10-02 MED ORDER — DEXAMETHASONE SODIUM PHOSPHATE 4 MG/ML IJ SOLN
INTRAMUSCULAR | Status: AC
  Filled 2013-10-02: qty 1

## 2013-10-02 MED ORDER — LACTATED RINGERS IV SOLN
INTRAVENOUS | Status: DC | PRN
  Administered 2013-10-02 (×2): via INTRAVENOUS

## 2013-10-02 MED ORDER — MIDAZOLAM HCL 2 MG/2ML IJ SOLN
INTRAMUSCULAR | Status: AC
  Filled 2013-10-02: qty 2

## 2013-10-02 MED ORDER — MIDAZOLAM HCL 5 MG/5ML IJ SOLN
INTRAMUSCULAR | Status: DC | PRN
  Administered 2013-10-02: 1 mg via INTRAVENOUS

## 2013-10-02 MED ORDER — NALOXONE HCL 0.4 MG/ML IJ SOLN: 0.4000 mg | INTRAMUSCULAR | Status: DC | PRN

## 2013-10-02 MED ORDER — ONDANSETRON HCL 4 MG/2ML IJ SOLN: 4.0000 mg | Freq: Once | INTRAMUSCULAR | Status: DC | PRN

## 2013-10-02 MED ORDER — SODIUM CHLORIDE 0.9 % IJ SOLN: 9.0000 mL | INTRAMUSCULAR | Status: DC | PRN

## 2013-10-02 MED ORDER — LACTATED RINGERS IV SOLN
INTRAVENOUS | Status: DC
  Administered 2013-10-02: 10:00:00 via INTRAVENOUS

## 2013-10-02 MED ORDER — ONDANSETRON HCL 4 MG/2ML IJ SOLN
INTRAMUSCULAR | Status: DC | PRN
  Administered 2013-10-02: 4 mg via INTRAVENOUS

## 2013-10-02 MED ORDER — FENTANYL CITRATE 0.05 MG/ML IJ SOLN
INTRAMUSCULAR | Status: AC
  Filled 2013-10-02: qty 2

## 2013-10-02 MED ORDER — ROCURONIUM BROMIDE 100 MG/10ML IV SOLN
INTRAVENOUS | Status: DC | PRN
  Administered 2013-10-02: 20 mg via INTRAVENOUS
  Administered 2013-10-02: 30 mg via INTRAVENOUS

## 2013-10-02 MED ORDER — GLYCOPYRROLATE 0.2 MG/ML IJ SOLN
INTRAMUSCULAR | Status: DC | PRN
  Administered 2013-10-02: 0.4 mg via INTRAVENOUS

## 2013-10-02 MED ORDER — ROCURONIUM BROMIDE 50 MG/5ML IV SOLN
INTRAVENOUS | Status: AC
  Filled 2013-10-02: qty 1

## 2013-10-02 MED ORDER — NEOSTIGMINE METHYLSULFATE 10 MG/10ML IV SOLN
INTRAVENOUS | Status: DC | PRN
  Administered 2013-10-02: 3 mg via INTRAVENOUS

## 2013-10-02 MED ORDER — LACTATED RINGERS IV SOLN
INTRAVENOUS | Status: DC
  Administered 2013-10-02 – 2013-10-07 (×10): via INTRAVENOUS

## 2013-10-02 MED ORDER — NEOSTIGMINE METHYLSULFATE 10 MG/10ML IV SOLN
INTRAVENOUS | Status: AC
  Filled 2013-10-02: qty 1

## 2013-10-02 MED ORDER — FAMOTIDINE IN NACL 20-0.9 MG/50ML-% IV SOLN
20.0000 mg | INTRAVENOUS | Status: DC
  Administered 2013-10-02 – 2013-10-04 (×3): 20 mg via INTRAVENOUS
  Filled 2013-10-02 (×3): qty 50

## 2013-10-02 MED ORDER — HYDROMORPHONE HCL PF 1 MG/ML IJ SOLN
0.2500 mg | INTRAMUSCULAR | Status: DC | PRN
  Administered 2013-10-02 (×4): 0.5 mg via INTRAVENOUS

## 2013-10-02 MED ORDER — LIDOCAINE HCL (CARDIAC) 20 MG/ML IV SOLN
INTRAVENOUS | Status: DC | PRN
  Administered 2013-10-02: 10 mg via INTRAVENOUS
  Administered 2013-10-02: 80 mg via INTRAVENOUS

## 2013-10-02 MED ORDER — FENTANYL 10 MCG/ML IV SOLN
INTRAVENOUS | Status: DC
  Administered 2013-10-02: 15 ug via INTRAVENOUS
  Administered 2013-10-02: 158.9 ug via INTRAVENOUS
  Filled 2013-10-02: qty 50

## 2013-10-02 MED ORDER — PROPOFOL 10 MG/ML IV BOLUS
INTRAVENOUS | Status: DC | PRN
  Administered 2013-10-02: 20 mg via INTRAVENOUS
  Administered 2013-10-02: 100 mg via INTRAVENOUS

## 2013-10-02 MED ORDER — HYDROMORPHONE HCL PF 1 MG/ML IJ SOLN
INTRAMUSCULAR | Status: AC
  Filled 2013-10-02: qty 1

## 2013-10-02 MED ORDER — VECURONIUM BROMIDE 10 MG IV SOLR
INTRAVENOUS | Status: AC
  Filled 2013-10-02: qty 10

## 2013-10-02 MED ORDER — LIDOCAINE HCL (CARDIAC) 20 MG/ML IV SOLN
INTRAVENOUS | Status: AC
  Filled 2013-10-02: qty 5

## 2013-10-02 MED ORDER — GLYCOPYRROLATE 0.2 MG/ML IJ SOLN
INTRAMUSCULAR | Status: AC
  Filled 2013-10-02: qty 2

## 2013-10-02 MED ORDER — HEPARIN SODIUM (PORCINE) 5000 UNIT/ML IJ SOLN
5000.0000 [IU] | Freq: Three times a day (TID) | INTRAMUSCULAR | Status: DC
  Administered 2013-10-03 – 2013-10-08 (×15): 5000 [IU] via SUBCUTANEOUS
  Filled 2013-10-02 (×25): qty 1

## 2013-10-02 MED ORDER — FENTANYL 10 MCG/ML IV SOLN
INTRAVENOUS | Status: DC
  Administered 2013-10-02: 450 ug via INTRAVENOUS
  Administered 2013-10-03: 591.8 ug via INTRAVENOUS
  Administered 2013-10-03: 272.9 ug via INTRAVENOUS
  Administered 2013-10-03: 280 ug via INTRAVENOUS
  Administered 2013-10-03: 500 ug via INTRAVENOUS
  Administered 2013-10-03: 140 ug via INTRAVENOUS
  Administered 2013-10-03 (×2): via INTRAVENOUS
  Administered 2013-10-04: 340 ug via INTRAVENOUS
  Administered 2013-10-04: 12:00:00 via INTRAVENOUS
  Administered 2013-10-04: 297.3 ug via INTRAVENOUS
  Administered 2013-10-04: 10 ug via INTRAVENOUS
  Administered 2013-10-04: 432.9 ug via INTRAVENOUS
  Administered 2013-10-04: 297.9 ug via INTRAVENOUS
  Administered 2013-10-04: 19:00:00 via INTRAVENOUS
  Administered 2013-10-05: 368.5 ug via INTRAVENOUS
  Administered 2013-10-05: 100 ug via INTRAVENOUS
  Filled 2013-10-02 (×11): qty 50

## 2013-10-02 SURGICAL SUPPLY — 55 items
BRR ADH 5X3 SEPRAFILM 6 SHT (MISCELLANEOUS) ×1
CANISTER SUCTION 2500CC (MISCELLANEOUS) ×3 IMPLANT
COVER MAYO STAND STRL (DRAPES) ×4 IMPLANT
COVER SURGICAL LIGHT HANDLE (MISCELLANEOUS) ×2 IMPLANT
DRAPE LAPAROSCOPIC ABDOMINAL (DRAPES) ×2 IMPLANT
DRAPE PROXIMA HALF (DRAPES) ×3 IMPLANT
DRAPE UTILITY 15X26 W/TAPE STR (DRAPE) ×10 IMPLANT
DRAPE WARM FLUID 44X44 (DRAPE) ×2 IMPLANT
DRSG MEPILEX BORDER 4X12 (GAUZE/BANDAGES/DRESSINGS) ×1 IMPLANT
DRSG OPSITE POSTOP 4X10 (GAUZE/BANDAGES/DRESSINGS) IMPLANT
DRSG OPSITE POSTOP 4X8 (GAUZE/BANDAGES/DRESSINGS) IMPLANT
ELECT BLADE 6.5 EXT (BLADE) ×2 IMPLANT
ELECT CAUTERY BLADE 6.4 (BLADE) ×4 IMPLANT
ELECT REM PT RETURN 9FT ADLT (ELECTROSURGICAL) ×2
ELECTRODE REM PT RTRN 9FT ADLT (ELECTROSURGICAL) ×1 IMPLANT
GLOVE BIOGEL PI IND STRL 6.5 (GLOVE) ×3 IMPLANT
GLOVE BIOGEL PI IND STRL 7.5 (GLOVE) ×2 IMPLANT
GLOVE BIOGEL PI INDICATOR 6.5 (GLOVE) ×3
GLOVE BIOGEL PI INDICATOR 7.5 (GLOVE) ×2
GLOVE SURG SS PI 6.5 STRL IVOR (GLOVE) ×10 IMPLANT
GLOVE SURG SS PI 7.0 STRL IVOR (GLOVE) ×1 IMPLANT
GOWN STRL REUS W/ TWL LRG LVL3 (GOWN DISPOSABLE) ×6 IMPLANT
GOWN STRL REUS W/TWL LRG LVL3 (GOWN DISPOSABLE) ×12
KIT BASIN OR (CUSTOM PROCEDURE TRAY) ×2 IMPLANT
KIT COLOSTOMY ILEOSTOMY 4 (WOUND CARE) ×1 IMPLANT
KIT ROOM TURNOVER OR (KITS) ×2 IMPLANT
LEGGING LITHOTOMY PAIR STRL (DRAPES) ×2 IMPLANT
LIGASURE IMPACT 36 18CM CVD LR (INSTRUMENTS) ×2 IMPLANT
NS IRRIG 1000ML POUR BTL (IV SOLUTION) ×10 IMPLANT
PACK GENERAL/GYN (CUSTOM PROCEDURE TRAY) ×2 IMPLANT
PAD ARMBOARD 7.5X6 YLW CONV (MISCELLANEOUS) ×4 IMPLANT
PENCIL BUTTON HOLSTER BLD 10FT (ELECTRODE) ×2 IMPLANT
SEPRAFILM PROCEDURAL PACK 3X5 (MISCELLANEOUS) ×2 IMPLANT
SPONGE GAUZE 4X4 12PLY (GAUZE/BANDAGES/DRESSINGS) ×4 IMPLANT
SPONGE LAP 18X18 X RAY DECT (DISPOSABLE) ×2 IMPLANT
STAPLER CUT CVD 40MM GREEN (STAPLE) ×2 IMPLANT
STAPLER PROXIMATE 75MM BLUE (STAPLE) ×1 IMPLANT
STAPLER VISISTAT 35W (STAPLE) ×2 IMPLANT
SUCTION POOLE TIP (SUCTIONS) ×2 IMPLANT
SURGILUBE 2OZ TUBE FLIPTOP (MISCELLANEOUS) IMPLANT
SUT PDS AB 1 TP1 96 (SUTURE) ×4 IMPLANT
SUT PROLENE 2 0 CT2 30 (SUTURE) ×2 IMPLANT
SUT PROLENE 2 0 KS (SUTURE) IMPLANT
SUT SILK 2 0 SH CR/8 (SUTURE) ×2 IMPLANT
SUT SILK 2 0 TIES 10X30 (SUTURE) ×2 IMPLANT
SUT SILK 3 0 SH CR/8 (SUTURE) ×2 IMPLANT
SUT SILK 3 0 TIES 10X30 (SUTURE) ×2 IMPLANT
SUT VIC AB 3-0 SH 18 (SUTURE) ×2 IMPLANT
SYR BULB IRRIGATION 50ML (SYRINGE) IMPLANT
TOWEL OR 17X26 10 PK STRL BLUE (TOWEL DISPOSABLE) ×4 IMPLANT
TRAY FOLEY METER SIL LF 16FR (CATHETERS) ×2 IMPLANT
TUBE CONNECTING 12X1/4 (SUCTIONS) ×2 IMPLANT
UNDERPAD 30X30 INCONTINENT (UNDERPADS AND DIAPERS) ×2 IMPLANT
WATER STERILE IRR 1000ML POUR (IV SOLUTION) IMPLANT
YANKAUER SUCT BULB TIP NO VENT (SUCTIONS) ×2 IMPLANT

## 2013-10-02 NOTE — Progress Notes (Signed)
Patient with increased pain.  Fentanyl PCA not working well at the current level.  Pain still at a level of 6-7.  Gave patient additional fentanyl bolus of 50 mcg, and increased 4 hour limit to 400 mcg, increase dose to 20 mcg per dose.  Kathryne Eriksson. Dahlia Bailiff, MD, [place] [phone] [facility]

## 2013-10-02 NOTE — Anesthesia Preprocedure Evaluation (Addendum)
Anesthesia Evaluation  Patient identified by MRN, date of birth, ID band Patient awake    Reviewed: Allergy & Precautions, H&P , NPO status , Patient's Chart, lab work & pertinent test results, reviewed documented beta blocker date and time   Airway       Dental  (+) Dental Advisory Given   Pulmonary shortness of breath,  breath sounds clear to auscultation        Cardiovascular hypertension, Pt. on medications and Pt. on home beta blockers + angina +CHF + pacemaker + Cardiac Defibrillator + Valvular Problems/Murmurs Rhythm:Regular Rate:Normal     Neuro/Psych  Headaches,  Neuromuscular disease    GI/Hepatic GERD-  ,  Endo/Other    Renal/GU Renal disease     Musculoskeletal  (+) Fibromyalgia -  Abdominal Normal abdominal exam  (+)   Peds  Hematology  (+) anemia ,   Anesthesia Other Findings   Reproductive/Obstetrics                          Anesthesia Physical Anesthesia Plan  ASA: III  Anesthesia Plan: General   Post-op Pain Management:    Induction: Intravenous  Airway Management Planned: Oral ETT  Additional Equipment:   Intra-op Plan:   Post-operative Plan: Extubation in OR  Informed Consent: I have reviewed the patients History and Physical, chart, labs and discussed the procedure including the risks, benefits and alternatives for the proposed anesthesia with the patient or authorized representative who has indicated his/her understanding and acceptance.   Dental advisory given  Plan Discussed with: Anesthesiologist and Surgeon  Anesthesia Plan Comments:         Anesthesia Quick Evaluation

## 2013-10-02 NOTE — Consult Note (Addendum)
Ashley [name] ostomy consult note Requested to mark pt for impending colostomy surgery today.  Assessed abd while lying, sitting, and standing.  Mark placed within rectus muscles, in line of vision, in area free from folds to LLQ. Stoma site marked 7 cm to left of umbilicus and 2 cm below.  Demonstrated pouch appearance and briefly described pouching routines.  Pt asked appropriate questions.  Educational materials left at bedside.  Plan to follow post-op for teaching sessions when stable. Julien Girt MSN, RN, [place], [place], [place]

## 2013-10-02 NOTE — Op Note (Signed)
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 Melissa Pulido Anesthesia: Gen. Endotracheal Indications: This is a 68 year old female who had perforated sigmoid diverticulitis several weeks ago. She was initially managed at a another hospital in [facility]. Apparently she underwent laparoscopic washout with placement of drains in the pelvic abscess. This plan failed and the patient required a percutaneous drain at that hospital. Unfortunately, all of her drains were removed and she was discharged home with a PICC line and IV antibiotics. She presented to the emergency department had a 7.5 cm abscess in her pelvis. This was not amenable to percutaneous drainage because of the location. She presents now for Hartman's resection.  Description of procedure: The patient brought to the operating room and placed in a supine position on the operating room table. After an adequate level of general anesthesia was obtained, the patient's legs were placed in lithotomy position in yellowfin stirrups. A nonlatex Foley catheter was placed. The patient has a lot of skin sensitivity so we used [place] MG for prep. We draped in sterile fashion. A timeout was taken to ensure the proper patient proper procedure. Made a lower midline incision and into the peritoneal cavity. There was no gross purulence as we entered the abdomen. There was some adhesions to the anterior abdominal wall in the pelvis. We opened the incision and placed the Bookwalter retractor. Is able to bluntly dissect down into the pelvis. Anterior part of the pelvis seems to be relatively clean. However there is a firm area of the distal sigmoid colon as it enters the rectum. There are a lot of adhesions in the left lower quadrant. We mobilized the left colon by dividing the white line of [place] and mobilized the colon medially. We  dissected down into the pelvis we entered the abscess cavity. We mobilized the sigmoid colon completely. We divided the sigmoid colon with a GIA-75 stapler above the area of inflammation. The LigaSure device was used to take down the mesentery of the sigmoid colon. We continued dissecting down toward below the inflammatory mass. We divided the upper rectum with a green contour stapler. The staple line was marked with 2-0 Prolene sutures. We irrigated thoroughly and inspected for hemostasis. The specimen was sent for pathologic examination. We then mobilized the descending colon. The patient had previously been marked by the ostomy nurse and we removed a circle of skin here. We dissected through the subcutaneous fat and incised the fascia vertically. The colostomy was then brought out through this skin incision. This was held in place with a Babcock clamp. We then thoroughly irrigated and inspected for hemostasis. Seprafilm was placed in the pelvis and in the anterior abdomen around the colostomy. The fascia was then reapproximated with double-stranded #1 PDS suture. The subcutaneous tissues were irrigated and staples were used to close the skin. We amputated the staple line at the descending colostomy and matured the colostomy with 3-0 Vicryl sutures. A properly sized ostomy appliance was obtained and attached to the patient's skin. A sterile dressing was applied to the midline incision. The patient was then extubated and brought to the recovery room in stable condition. All sponge, instrument, and needle counts are correct.  Imogene Burn. Georgette Dover, MD, [facility]  General/ Trauma Surgery  10/02/2013 1:57 PM

## 2013-10-02 NOTE — Transfer of Care (Signed)
Immediate Anesthesia Transfer of Care Note  Patient: Ashley [name]  Procedure(s) Performed: Procedure(s): PARTIAL COLECTOMY (N/A) DESCENDING COLOSTOMY (N/A)  Patient Location: PACU  Anesthesia Type:General  Level of Consciousness: patient cooperative and responds to stimulation  Airway & Oxygen Therapy: Patient Spontanous Breathing and Patient connected to nasal cannula oxygen  Post-op Assessment: Report given to PACU RN, Post -op Vital signs reviewed and stable and Patient moving all extremities X 4  Post vital signs: Reviewed and stable  Complications: No apparent anesthesia complications

## 2013-10-02 NOTE — Anesthesia Postprocedure Evaluation (Signed)
  Anesthesia Post-op Note  Patient: Ashley [name]  Procedure(s) Performed: Procedure(s): PARTIAL COLECTOMY (N/A) DESCENDING COLOSTOMY (N/A)  Patient Location: PACU  Anesthesia Type:General  Level of Consciousness: awake, oriented, sedated and patient cooperative  Airway and Oxygen Therapy: Patient Spontanous Breathing  Post-op Pain: moderate  Post-op Assessment: Post-op Vital signs reviewed, Patient's Cardiovascular Status Stable, Respiratory Function Stable, Patent Airway and No signs of Nausea or vomiting  Post-op Vital Signs: stable  Last Vitals:  Filed Vitals:   10/02/13 1528  BP:   Pulse:   Temp: 36.8 C  Resp:     Complications: No apparent anesthesia complications

## 2013-10-02 NOTE — Progress Notes (Signed)
Subjective: Cleared by Cardiology - no need for further work-up WBC normal Electrolytes WNL   Objective: Vital signs in last 24 hours: Temp:  [97.9 F (36.6 C)-98.4 F (36.9 C)] 97.9 F (36.6 C) (05/21 0616) Pulse Rate:  [76-81] 76 (05/21 0616) Resp:  [16-18] 16 (05/21 0616) BP: (114-128)/(54-58) 128/58 mmHg (05/21 0616) SpO2:  [94 %-98 %] 98 % (05/21 0616) Last BM Date: 10/01/13  Intake/Output from previous day: 05/20 0701 - 05/21 0700 In: 373.3 [I.V.:373.3] Out: 925 [Urine:925] Intake/Output this shift:     Lab Results:   Recent Labs  10/01/13 0540 10/02/13 0520  WBC 11.1* 9.7  HGB 10.1* 10.5*  HCT 31.5* 33.2*  PLT 482* 495*   BMET  Recent Labs  10/01/13 0540 10/02/13 0520  NA 134* 137  K 4.9 4.8  CL 98 100  CO2 23 20  GLUCOSE 94 90  BUN 20 17  CREATININE 0.82 0.75  CALCIUM 9.5 9.7   PT/INR No results found for this basename: LABPROT, INR,  in the last 72 hours ABG No results found for this basename: PHART, PCO2, PO2, HCO3,  in the last 72 hours  Studies/Results: Ct Abdomen Pelvis W Contrast  09/30/2013   CLINICAL DATA:  Sigmoid diverticulitis, followup, recent laparoscopic surgery with 3 abscess drains at an outside facility.  EXAM: CT ABDOMEN AND PELVIS WITH CONTRAST  TECHNIQUE: Multidetector CT imaging of the abdomen and pelvis was performed using the standard protocol following bolus administration of intravenous contrast. Sagittal and coronal MPR images reconstructed from axial data set.  CONTRAST:  6mL OMNIPAQUE IOHEXOL 300 MG/ML SOLN IV. Dilute oral contrast.  COMPARISON:  None  FINDINGS: Dependent atelectasis at both lung bases.  AICD lead in RIGHT ventricle.  Small diaphragmatic defect at posterior medial LEFT diaphragm containing fat.  Tiny BILATERAL renal cysts.  Liver, spleen, pancreas, kidneys, and adrenal glands otherwise normal.  Uterus surgically absent with nonvisualization of ovaries and appendix.  Bladder decompressed.  Extraluminal  collection of gas, fluid and contrast identified in central pelvis, superior to and extending to the urinary bladder, located between small bowel loops and sigmoid colon, measuring approximately 7.4 cm in length, up to 3.4 cm transverse and 2.5 cm AP.  Finding is compatible with a abscess collection which communicates with the GI tract.  Free intraperitoneal identified in the LEFT pelvis extending from image 69 through image 77.  Diverticulosis of the distal descending and sigmoid colon with sigmoid wall thickening and persistent pericolic inflammatory changes at the sigmoid mesocolon.  Stomach and remaining bowel loops unremarkable.  No additional abscess collections identified.  No surgical drains identified.  No mass, adenopathy, ascites, or acute osseous findings.  IMPRESSION: Sigmoid diverticulitis.  Abscess collection within the lower central pelvis which communicates with the GI tract, containing GI contrast, measuring in maximal dimensions 7.4 cm CC x 3.4 cm transverse x 2.5 cm AP.  Free intraperitoneal air in LEFT pelvis ; this could be from drainage catheters if they were recently removed or from continued intraperitoneal bowel perforation/leak.  Findings called to Dr. Leo Grosser on 09/30/2013 at 1533 hours.   Electronically Signed   By: Lavonia Dana M.D.   On: 09/30/2013 15:36   Dg Chest Portable 1 View  09/30/2013   CLINICAL DATA:  Abdominal pain.  Diarrhea.  EXAM: PORTABLE CHEST - 1 VIEW  COMPARISON:  06/11/2012  FINDINGS: AICD in place. PICC in good position in the superior vena cava above the cavoatrial junction 2 cm below the carina.  Heart size  and pulmonary vascularity are normal. Lungs are clear. No acute osseous abnormality.  IMPRESSION: No acute abnormalities.   Electronically Signed   By: Rozetta Nunnery M.D.   On: 09/30/2013 19:19    Anti-infectives: Anti-infectives   Start     Dose/Rate Route Frequency Ordered Stop   09/30/13 2200  ertapenem (INVANZ) 1 g in sodium chloride 0.9 % 50 mL  IVPB     1 g 100 mL/hr over 30 Minutes Intravenous Every 24 hours 09/30/13 2147     09/30/13 1745  ertapenem (INVANZ) 1 g in sodium chloride 0.9 % 50 mL IVPB     1 g 100 mL/hr over 30 Minutes Intravenous  Once 09/30/13 1732 09/30/13 2045      Assessment/Plan: s/p Procedure(s): PARTIAL COLECTOMY (N/A) DESCENDING COLOSTOMY (N/A) Plan surgery today.  Will ask ostomy nurse to mark the patient pre-op. The surgical procedure has been discussed with the patient.  Potential risks, benefits, alternative treatments, and expected outcomes have been explained.  All of the patient's questions at this time have been answered.  The likelihood of reaching the patient's treatment goal is good.  The patient understand the proposed surgical procedure and wishes to proceed.   LOS: 2 days    Imogene Burn. Carmita Boom 10/02/2013

## 2013-10-02 NOTE — Progress Notes (Signed)
[facility]                                                                              Patient Demographics  Ashley [name], is a 68 y.o. female, DOB - 06/22/1945, QQV:[id number]  Admit date - 09/30/2013   Admitting Physician Eduard Clos, MD  Outpatient Primary MD for the patient is [name], MD  LOS - 2   Chief Complaint  Patient presents with  . Abdominal Pain  . Diarrhea     HPI: Ashley [name] is a 68 y.o. female history of cardiomyopathy and chronic kidney disease was recently admitted at [facility] in [place] for ruptured diverticulitis with abscess. Patient initially had laparoscopic washout and drain placed followed by percutaneous drain placement once patient was found to have persistent abscess. Last week patient's drain was removed and last Friday 5 days ago patient was discharged home on imipenem with PICC line. Patient followed with primary care physician yesterday and was found to have persistent leukocytosis and primary care physician ordered CT abdomen and pelvis which shows abscess connecting to colon. Dr. Violeta Gelinas was consulted and at this time patient has been admitted for further management. Patient has had at least 4 bouts of diarrhea after patient had contrast. Has mild left lower quadrant pain. Denies any nausea vomiting. Denies any fever chills or chest pain.    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 -Status post diagnostic laparoscopy with abdominal washout and drain placement the [place] -Patient had percutaneous drain with drain removal before admission -General surgery consulted, and surgery to occur later today -Interventional radiology unable to access this access for percutaneous drain -Cardiology was consulted for surgical assessment, and found patient to be low risk and recommended Coreg 6.25mg  BID, however patient is NPO -Continue ertapenem (patient was placed on  imipenem as an outpatient through PICC line)  History of cardiomyopathy status post ICD placement -Last echocardiogram in October of 2014 shows an EF of 50-55% -Will continue to monitor patient's daily weights as well as intake and output -Currently n.p.o. do to perforated diverticulitis  Chronic kidney disease, stage 3-4 -Currently creatinine 0.75 appears to be below baseline -Will continue to monitor BMP  Normocytic Anemia -Baseline hemoglobin appears to be well -Currently 10.5, will continue to monitor CBC  Thrombocytosis -Likely reactive -Admission, 607, currently trending downward 495  Depression/Anxiety -Currently holding xanax  Hypertension -Continue metoprolol IV -Holding home medications of Coreg and spironolactone  Code Status: Full  Family Communication: None at bedside  Disposition Plan: Admitted  Time Spent in minutes   25 minutes  Procedures none  Consults   General surgery Cardiology  DVT Prophylaxis  SCDs  Lab Results  Component Value Date   PLT 495* 10/02/2013    Medications  Scheduled Meds: . ALPRAZolam  0.25 mg Oral Once  . ertapenem  1 g Intravenous Q24H  . metoprolol  2.5 mg Intravenous 3 times per day   Continuous Infusions:   PRN Meds:.acetaminophen, acetaminophen, ondansetron (ZOFRAN) IV, ondansetron, sodium chloride  Antibiotics    Anti-infectives   Start     Dose/Rate Route Frequency Ordered Stop   09/30/13 2200  ertapenem [facility]) 1 g in sodium chloride 0.9 % 50 mL IVPB     1 g 100 mL/hr over 30 Minutes Intravenous Every 24 hours 09/30/13 2147     09/30/13 1745  ertapenem (INVANZ) 1 g in sodium chloride 0.9 % 50 mL IVPB     1 g 100 mL/hr over 30 Minutes Intravenous  Once 09/30/13 1732 09/30/13 2045      Subjective:   [facility] seen and examined today.  Patient states she is anxious about her surgery.  She states her abdominal pain is minimal. Currently denies any chest pain or shortness of breath  Objective:     Filed Vitals:   10/01/13 0551 10/01/13 1321 10/01/13 2238 10/02/13 0616  BP: 120/60 114/54 114/55 128/58  Pulse: 73 76 81 76  Temp: 98.5 F (36.9 C) 98.4 F (36.9 C) 98.3 F (36.8 C) 97.9 F (36.6 C)  TempSrc: Oral Oral Oral Oral  Resp: 18 18 17 16   Height:      Weight:      SpO2: 98% 97% 94% 98%    Wt Readings from Last 3 Encounters:  09/30/13 77.429 kg (170 lb 11.2 oz)  09/30/13 77.429 kg (170 lb 11.2 oz)  09/29/13 71.101 kg (156 lb 12 oz)     Intake/Output Summary (Last 24 hours) at 10/02/13 0817 Last data filed at 10/02/13 0817  Gross per 24 hour  Intake 373.33 ml  Output   1125 ml  Net -751.67 ml    Exam  General: Well developed, well nourished, NAD, appears stated age  HEENT: NCAT, PERRLA, EOMI, Anicteic Sclera, mucous membranes moist.  Neck: Supple, no JVD, no masses  Cardiovascular: S1 S2 auscultated, no rubs, murmurs or gallops. Regular rate and rhythm.  Respiratory: Clear to auscultation bilaterally with equal chest rise  Abdomen: Soft, LLQ tenderness, nondistended, + bowel sounds, prior healed surgical incisions  Extremities: warm dry without cyanosis clubbing or edema  Neuro: AAOx3, no focal deficits  Skin: Without rashes exudates or nodules  Psych: Somewhat anxious, however appropriate mood and affect  Data Review   Micro Results Recent Results (from the past 240 hour(s))  SURGICAL PCR SCREEN     Status: None   Collection Time    10/01/13  6:20 PM      Result Value Ref Range Status   MRSA, PCR NEGATIVE  NEGATIVE Final   Staphylococcus aureus NEGATIVE  NEGATIVE Final   Comment:            The Xpert SA Assay (FDA     approved for NASAL specimens     in patients over 34 years of age),     is one component of     a comprehensive surveillance     program.  Test performance has     been validated by [employer] for patients greater     than or equal to 71 year old.     It is not intended     to diagnose infection nor to      guide or monitor treatment.    Radiology Reports Ct Abdomen Pelvis W Contrast  09/30/2013   CLINICAL DATA:  Sigmoid diverticulitis, followup, recent laparoscopic surgery with 3 abscess drains at an outside facility.  EXAM: CT ABDOMEN AND PELVIS WITH CONTRAST  TECHNIQUE: Multidetector CT imaging of the abdomen and pelvis was performed using the standard protocol following bolus administration of intravenous contrast. Sagittal and coronal MPR images reconstructed from axial data set.  CONTRAST:  80mL OMNIPAQUE IOHEXOL 300 MG/ML SOLN IV. Dilute oral contrast.  COMPARISON:  None  FINDINGS: Dependent atelectasis at both lung bases.  AICD lead in RIGHT ventricle.  Small diaphragmatic defect at posterior medial LEFT diaphragm containing fat.  Tiny BILATERAL renal cysts.  Liver, spleen, pancreas, kidneys, and adrenal glands otherwise normal.  Uterus surgically absent with nonvisualization of ovaries and appendix.  Bladder decompressed.  Extraluminal collection of gas, fluid and contrast identified in central pelvis, superior to and extending to the urinary bladder, located between small bowel loops and sigmoid colon, measuring approximately 7.4 cm in length, up to 3.4 cm transverse and 2.5 cm AP.  Finding is compatible with a abscess collection which communicates with the GI tract.  Free intraperitoneal identified in the LEFT pelvis extending from image 69 through image 77.  Diverticulosis of the distal descending and sigmoid colon with sigmoid wall thickening and persistent pericolic inflammatory changes at the sigmoid mesocolon.  Stomach and remaining bowel loops unremarkable.  No additional abscess collections identified.  No surgical drains identified.  No mass, adenopathy, ascites, or acute osseous findings.  IMPRESSION: Sigmoid diverticulitis.  Abscess collection within the lower central pelvis which communicates with the GI tract, containing GI contrast, measuring in maximal dimensions 7.4 cm CC x 3.4 cm  transverse x 2.5 cm AP.  Free intraperitoneal air in LEFT pelvis ; this could be from drainage catheters if they were recently removed or from continued intraperitoneal bowel perforation/leak.  Findings called to Dr. Patrice Paradise on 09/30/2013 at 1533 hours.   Electronically Signed   By: Ulyses Southward M.D.   On: 09/30/2013 15:36   Dg Chest Portable 1 View  09/30/2013   CLINICAL DATA:  Abdominal pain.  Diarrhea.  EXAM: PORTABLE CHEST - 1 VIEW  COMPARISON:  06/11/2012  FINDINGS: AICD in place. PICC in good position in the superior vena cava above the cavoatrial junction 2 cm below the carina.  Heart size and pulmonary vascularity are normal. Lungs are clear. No acute osseous abnormality.  IMPRESSION: No acute abnormalities.   Electronically Signed   By: Geanie Cooley M.D.   On: 09/30/2013 19:19    CBC  Recent Labs Lab 09/29/13 1516 09/30/13 1705 10/01/13 0540 10/02/13 0520  WBC 16.2* 12.5* 11.1* 9.7  HGB 11.6* 11.2* 10.1* 10.5*  HCT 35.0* 33.9* 31.5* 33.2*  PLT 607.0* 553* 482* 495*  MCV 88.2 88.3 89.5 90.0  MCH  --  29.2 28.7 28.5  MCHC 33.1 33.0 32.1 31.6  RDW 13.1 13.1 13.2 13.0  LYMPHSABS 3.8 3.2 2.8  --   MONOABS 1.2* 1.1* 1.1*  --   EOSABS 0.2 0.1 0.1  --   BASOSABS 0.2* 0.0 0.0  --     Chemistries   Recent Labs Lab 09/29/13 1516 09/30/13 1705 10/01/13 0540 10/02/13 0520  NA 132* 131* 134* 137  K 5.2* 4.9 4.9 4.8  CL 94* 94* 98 100  CO2 26 23 23 20   GLUCOSE 99 97 94 90  BUN 27* 24* 20 17  CREATININE 1.1 0.90 0.82 0.75  CALCIUM 10.2 9.8 9.5 9.7  AST 16 17 16   --   ALT 21 19 17   --   ALKPHOS 191* 197* 190*  --   BILITOT 0.3 0.2* 0.2*  --    ------------------------------------------------------------------------------------------------------------------ estimated creatinine clearance is 69.3 ml/min (by C-G formula based on Cr of 0.75). ------------------------------------------------------------------------------------------------------------------ No results found for  this basename: HGBA1C,  in the last 72 hours ------------------------------------------------------------------------------------------------------------------ No results found for this  basename: CHOL, HDL, LDLCALC, TRIG, CHOLHDL, LDLDIRECT,  in the last 72 hours ------------------------------------------------------------------------------------------------------------------ No results found for this basename: TSH, T4TOTAL, FREET3, T3FREE, THYROIDAB,  in the last 72 hours ------------------------------------------------------------------------------------------------------------------ No results found for this basename: VITAMINB12, FOLATE, FERRITIN, TIBC, IRON, RETICCTPCT,  in the last 72 hours  Coagulation profile No results found for this basename: INR, PROTIME,  in the last 168 hours  No results found for this basename: DDIMER,  in the last 72 hours  Cardiac Enzymes No results found for this basename: CK, CKMB, TROPONINI, MYOGLOBIN,  in the last 168 hours ------------------------------------------------------------------------------------------------------------------ No components found with this basename: POCBNP,     Ashley [name] D.O. on 10/02/2013 at 8:17 AM  Between 7am to 7pm - Pager - [phone]  After 7pm go to [email or web address] - password [email or web address]  And look for the night coverage person covering for me after hours  [facility] Office  [phone]

## 2013-10-03 ENCOUNTER — Encounter (HOSPITAL_COMMUNITY): Payer: Self-pay | Admitting: Surgery

## 2013-10-03 DIAGNOSIS — F410 Panic disorder [episodic paroxysmal anxiety] without agoraphobia: Secondary | ICD-10-CM | POA: Diagnosis not present

## 2013-10-03 DIAGNOSIS — N183 Chronic kidney disease, stage 3 unspecified: Secondary | ICD-10-CM | POA: Diagnosis not present

## 2013-10-03 DIAGNOSIS — D649 Anemia, unspecified: Secondary | ICD-10-CM | POA: Diagnosis not present

## 2013-10-03 DIAGNOSIS — K651 Peritoneal abscess: Secondary | ICD-10-CM | POA: Diagnosis not present

## 2013-10-03 LAB — CBC
HCT: 31.4 % — ABNORMAL LOW (ref 36.0–46.0)
Hemoglobin: 9.9 g/dL — ABNORMAL LOW (ref 12.0–15.0)
MCH: 28.3 pg (ref 26.0–34.0)
MCHC: 31.5 g/dL (ref 30.0–36.0)
MCV: 89.7 fL (ref 78.0–100.0)
Platelets: 436 10*3/uL — ABNORMAL HIGH (ref 150–400)
RBC: 3.5 MIL/uL — ABNORMAL LOW (ref 3.87–5.11)
RDW: 13.4 % (ref 11.5–15.5)
WBC: 18.2 10*3/uL — ABNORMAL HIGH (ref 4.0–10.5)

## 2013-10-03 LAB — BASIC METABOLIC PANEL
BUN: 17 mg/dL (ref 6–23)
CO2: 20 mEq/L (ref 19–32)
Calcium: 9 mg/dL (ref 8.4–10.5)
Chloride: 98 mEq/L (ref 96–112)
Creatinine, Ser: 0.8 mg/dL (ref 0.50–1.10)
GFR calc Af Amer: 86 mL/min — ABNORMAL LOW (ref >90)
GFR calc non Af Amer: 74 mL/min — ABNORMAL LOW (ref >90)
Glucose, Bld: 100 mg/dL — ABNORMAL HIGH (ref 70–99)
Potassium: 4.9 mEq/L (ref 3.7–5.3)
Sodium: 135 mEq/L — ABNORMAL LOW (ref 137–147)

## 2013-10-03 LAB — GLUCOSE, CAPILLARY
Glucose-Capillary: 100 mg/dL — ABNORMAL HIGH (ref 70–99)
Glucose-Capillary: 100 mg/dL — ABNORMAL HIGH (ref 70–99)
Glucose-Capillary: 112 mg/dL — ABNORMAL HIGH (ref 70–99)

## 2013-10-03 NOTE — Progress Notes (Signed)
PT Cancellation Note  Patient Details Name: Ashley [name] MRN: [id number] DOB: 1945/06/04   Cancelled Treatment:    Reason Eval/Treat Not Completed: Pain limiting ability to participate. Pt requesting to hold on OOB activities due to pain. Will re-attempt at next available time.    [place], [place] 10/03/2013, 8:48 AM

## 2013-10-03 NOTE — Evaluation (Signed)
Physical Therapy Evaluation Patient Details Name: JEILANI GRUPE MRN: [id number] DOB: 26-Jan-1946 Today's Date: 10/03/2013   History of Present Illness  SHARDAY MICHL is a 68 y.o. female history of cardiomyopathy and chronic kidney disease was recently admitted at [facility] in [place] for ruptured diverticulitis with abscess. 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  Clinical Impression  Patient is s/p surgery listed above resulting in functional limitations due to the deficits listed below (see PT Problem List). Patient will benefit from skilled PT to increase their independence and safety with mobility to allow discharge to the venue listed below. Pt greatly limited with all mobility due to pain and fatigue. Pt requires max encouragement and increased time for all mobility.      Follow Up Recommendations Home health PT;Supervision/Assistance - 24 hour (may progress to OPPT )    Equipment Recommendations  None recommended by PT    Recommendations for Other Services OT consult     Precautions / Restrictions Precautions Precautions: Fall Precaution Comments: Colostomy  Restrictions Weight Bearing Restrictions: No      Mobility  Bed Mobility Overal bed mobility: Needs Assistance Bed Mobility: Supine to Sit     Supine to sit: Min assist;HOB elevated     General bed mobility comments: requires incr time due to pain; (A) to advance trunk to sitting position; max cues for hand placement and sequencing   Transfers Overall transfer level: Needs assistance Equipment used: Rolling walker (2 wheeled) Transfers: Sit to/from [facility] Sit to Stand: Min assist Stand pivot transfers: Min assist       General transfer comment:  (A) to maintain balance and manage RW; cues for hand placement and sequencing; pt requried incr time for mobiltiy due to pain   Ambulation/Gait                Stairs            Wheelchair Mobility    Modified Rankin (Stroke Patients Only)       Balance Overall balance assessment: Needs assistance Sitting-balance support: Feet supported;Single extremity supported Sitting balance-Leahy Scale: Poor Sitting balance - Comments: guarded sitting EOB due to pain; requires UE support; tolerated sitting EOB ~6 min prior to transfer   Standing balance support: During functional activity;Bilateral upper extremity supported Standing balance-Leahy Scale: Poor Standing balance comment: min (A) to maintain balance; Bil UEs supported by RW                             Pertinent Vitals/Pain 10/10 in Rt flank; PCA for pain PRN    Home Living Family/patient expects to be discharged to:: Private residence Living Arrangements: Spouse/significant other Available Help at Discharge: Family;Available 24 hours/day Type of Home: House Home Access: Stairs to enter Entrance Stairs-Rails: Can reach both;Left;Right Entrance Stairs-Number of Steps: 5 Home Layout: Able to live on main level with bedroom/bathroom Home Equipment: Walker - 2 wheels Additional Comments: pt has been staying on main floor since admission to [facility] hospital and was ambulating with RW     Prior Function Level of Independence: Independent         Comments: pt was independent PTA on May 1st to [facility] hospital; since D/C was ambulating with RW      Hand Dominance  Dominant Hand: Right    Extremity/Trunk Assessment   Upper Extremity Assessment: Defer to OT evaluation           Lower Extremity Assessment: Generalized weakness      Cervical / Trunk Assessment: Normal  Communication   Communication: No difficulties  Cognition Arousal/Alertness: Awake/alert Behavior During Therapy: Anxious Overall  Cognitive Status: Within Functional Limits for tasks assessed                      General Comments      Exercises General Exercises - Lower Extremity Ankle Circles/Pumps: AROM;15 reps;Both      Assessment/Plan    PT Assessment Patient needs continued PT services  PT Diagnosis Difficulty walking;Generalized weakness;Acute pain   PT Problem List Decreased strength;Decreased activity tolerance;Decreased balance;Decreased mobility;Pain  PT Treatment Interventions DME instruction;Gait training;Stair training;Functional mobility training;Therapeutic activities;Therapeutic exercise;Balance training;Neuromuscular re-education;Patient/family education   PT Goals (Current goals can be found in the Care Plan section) Acute Rehab PT Goals Patient Stated Goal: to not have pain and go slow PT Goal Formulation: With patient Time For Goal Achievement: 10/10/13 Potential to Achieve Goals: Good    Frequency Min 3X/week   Barriers to discharge        Co-evaluation               End of Session Equipment Utilized During Treatment: Gait belt;Oxygen Activity Tolerance: Patient limited by pain;Patient limited by fatigue Patient left: in chair;with call bell/phone within reach Nurse Communication: Mobility status;Precautions         Time: 1031-1100 PT Time Calculation (min): 29 min   Charges:   PT Evaluation $Initial PT Evaluation Tier I: 1 Procedure PT Treatments $Therapeutic Activity: 23-37 mins   PT G CodesKennis Carina [place], [place]  [phone] 10/03/2013, 12:42 PM

## 2013-10-03 NOTE — Progress Notes (Signed)
[facility]                                                                              Patient Demographics  Ashley [name], is a 68 y.o. female, DOB - Sep 27, 1945, OZH:[id number]  Admit date - 09/30/2013   Admitting Physician Eduard Clos, MD  Outpatient Primary MD for the patient is [name], MD  LOS - 3   Chief Complaint  Patient presents with  . Abdominal Pain  . Diarrhea     HPI: Ashley [name] is a 68 y.o. female history of cardiomyopathy and chronic kidney disease was recently admitted at [facility] in [place] for ruptured diverticulitis with abscess. Patient initially had laparoscopic washout and drain placed followed by percutaneous drain placement once patient was found to have persistent abscess. Last week patient's drain was removed and last Friday 5 days ago patient was discharged home on imipenem with PICC line. Patient followed with primary care physician yesterday and was found to have persistent leukocytosis and primary care physician ordered CT abdomen and pelvis which shows abscess connecting to colon. Dr. Violeta Gelinas was consulted and at this time patient has been admitted for further management. Patient has had at least 4 bouts of diarrhea after patient had contrast. Has mild left lower quadrant pain. Denies any nausea vomiting. Denies any fever chills or chest pain.    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 -Status post diagnostic laparoscopy with abdominal washout and drain placement the [place] -Patient had percutaneous drain with drain removal before admission -General surgery consulted, POD1 from partial coloectomy/descending colostomy -Interventional radiology unable to access this access for percutaneous drain -Cardiology was consulted for surgical assessment, and found patient to be low risk and recommended Coreg 6.25mg  BID, however patient is NPO -Continue ertapenem  (patient was placed on imipenem as an outpatient through PICC line) -Continue NPO except for ice chips and sips (per surgery)  History of cardiomyopathy status post ICD placement -Last echocardiogram in October of 2014 shows an EF of 50-55% -Will continue to monitor patient's daily weights as well as intake and output -Currently n.p.o. do to perforated diverticulitis  Chronic kidney disease, stage 3-4 -Currently creatinine 0.8 appears to be below baseline -Will continue to monitor BMP  Normocytic Anemia -Baseline hemoglobin appears to be 10-11 -Currently 9.9, will continue to monitor CBC  Thrombocytosis -Likely reactive -Admission, 607, currently trending downward 436  Depression/Anxiety -Currently holding xanax as patient is NPO  Hypertension -Continue metoprolol IV -Holding home medications of Coreg and spironolactone  Code Status: Full  Family Communication: None at bedside  Disposition Plan: Admitted  Time Spent in minutes   25 minutes  Procedures  Sigmoid colectomy with descending colostomy (Hartman's procedure)  Consults   General surgery Cardiology  DVT Prophylaxis  SCDs  Lab Results  Component Value Date   PLT 436* 10/03/2013    Medications  Scheduled Meds: . ertapenem  1 g Intravenous Q24H  . famotidine (PEPCID) IV  20 mg Intravenous Q24H  . fentaNYL   Intravenous 6 times per day  . heparin subcutaneous  5,000 Units Subcutaneous 3 times per day  . metoprolol  2.5 mg Intravenous 3 times per day   Continuous Infusions: . lactated ringers 75 mL/hr at 10/03/13 0810   PRN Meds:.acetaminophen, acetaminophen, diphenhydrAMINE, diphenhydrAMINE, naloxone, ondansetron (ZOFRAN) IV, ondansetron, oxyCODONE-acetaminophen, sodium chloride, sodium chloride  Antibiotics    Anti-infectives   Start     Dose/Rate Route Frequency Ordered Stop   09/30/13 2200  ertapenem (INVANZ) 1 g in sodium chloride 0.9 % 50 mL IVPB     1 g 100 mL/hr over 30 Minutes Intravenous  Every 24 hours 09/30/13 2147     09/30/13 1745  ertapenem (INVANZ) 1 g in sodium chloride 0.9 % 50 mL IVPB     1 g 100 mL/hr over 30 Minutes Intravenous  Once 09/30/13 1732 09/30/13 2045      Subjective:   [facility] seen and examined today.  Patient states she has a lot of pain and feels very tired.  She sent PT away and asked them to come back later.  She denies chest pain, shortness of breath.  Objective:   Filed Vitals:   10/03/13 0127 10/03/13 0520 10/03/13 0609 10/03/13 0752  BP: 120/50  104/48   Pulse: 87  88   Temp: 98.2 F (36.8 C)  98.6 F (37 C)   TempSrc: Oral  Oral   Resp: 19 18 17 19   Height:      Weight:      SpO2: 98% 97% 96% 96%    Wt Readings from Last 3 Encounters:  10/02/13 77.38 kg (170 lb 9.5 oz)  10/02/13 77.38 kg (170 lb 9.5 oz)  09/29/13 71.101 kg (156 lb 12 oz)     Intake/Output Summary (Last 24 hours) at 10/03/13 0852 Last data filed at 10/03/13 0520  Gross per 24 hour  Intake   2473 ml  Output    825 ml  Net   1648 ml    Exam  General: Well developed, well nourished, NAD, appears stated age  HEENT: NCAT, PERRLA, EOMI, Anicteic Sclera, mucous membranes moist.  Neck: Supple, no JVD, no masses  Cardiovascular: S1 S2 auscultated, no rubs, murmurs or gallops. Regular rate and rhythm.  Respiratory: Clear to auscultation bilaterally with equal chest rise  Abdomen: Soft, diffuse tenderness to light touch, nondistended, + bowel sounds, ostomy bag in place, stoma pink  Extremities: warm dry without cyanosis clubbing or edema  Neuro: AAOx3, no focal deficits  Skin: Without rashes exudates or nodules  Psych: appropriate mood and affect  Data Review   Micro Results Recent Results (from the past 240 hour(s))  CULTURE, BLOOD (ROUTINE X 2)     Status: None   Collection Time    09/30/13  7:52 PM      Result Value Ref Range Status   Specimen Description BLOOD ARM LEFT   Final   Special Requests BOTTLES DRAWN AEROBIC AND  ANAEROBIC 5CC   Final   Culture  Setup Time     Final   Value: 10/01/2013 01:03     Performed at [facility]   Culture     Final   Value:        BLOOD CULTURE RECEIVED NO GROWTH TO DATE CULTURE WILL BE HELD FOR 5 DAYS BEFORE ISSUING A FINAL NEGATIVE REPORT     Performed at [facility]   Report Status PENDING   Incomplete  SURGICAL PCR SCREEN     Status: None   Collection Time    10/01/13  6:20 PM      Result Value Ref Range  Status   MRSA, PCR NEGATIVE  NEGATIVE Final   Staphylococcus aureus NEGATIVE  NEGATIVE Final   Comment:            The Xpert SA Assay (FDA     approved for NASAL specimens     in patients over 22 years of age),     is one component of     a comprehensive surveillance     program.  Test performance has     been validated by [employer] for patients greater     than or equal to 90 year old.     It is not intended     to diagnose infection nor to     guide or monitor treatment.    Radiology Reports Ct Abdomen Pelvis W Contrast  09/30/2013   CLINICAL DATA:  Sigmoid diverticulitis, followup, recent laparoscopic surgery with 3 abscess drains at an outside facility.  EXAM: CT ABDOMEN AND PELVIS WITH CONTRAST  TECHNIQUE: Multidetector CT imaging of the abdomen and pelvis was performed using the standard protocol following bolus administration of intravenous contrast. Sagittal and coronal MPR images reconstructed from axial data set.  CONTRAST:  80mL OMNIPAQUE IOHEXOL 300 MG/ML SOLN IV. Dilute oral contrast.  COMPARISON:  None  FINDINGS: Dependent atelectasis at both lung bases.  AICD lead in RIGHT ventricle.  Small diaphragmatic defect at posterior medial LEFT diaphragm containing fat.  Tiny BILATERAL renal cysts.  Liver, spleen, pancreas, kidneys, and adrenal glands otherwise normal.  Uterus surgically absent with nonvisualization of ovaries and appendix.  Bladder decompressed.  Extraluminal collection of gas, fluid and contrast identified in central  pelvis, superior to and extending to the urinary bladder, located between small bowel loops and sigmoid colon, measuring approximately 7.4 cm in length, up to 3.4 cm transverse and 2.5 cm AP.  Finding is compatible with a abscess collection which communicates with the GI tract.  Free intraperitoneal identified in the LEFT pelvis extending from image 69 through image 77.  Diverticulosis of the distal descending and sigmoid colon with sigmoid wall thickening and persistent pericolic inflammatory changes at the sigmoid mesocolon.  Stomach and remaining bowel loops unremarkable.  No additional abscess collections identified.  No surgical drains identified.  No mass, adenopathy, ascites, or acute osseous findings.  IMPRESSION: Sigmoid diverticulitis.  Abscess collection within the lower central pelvis which communicates with the GI tract, containing GI contrast, measuring in maximal dimensions 7.4 cm CC x 3.4 cm transverse x 2.5 cm AP.  Free intraperitoneal air in LEFT pelvis ; this could be from drainage catheters if they were recently removed or from continued intraperitoneal bowel perforation/leak.  Findings called to Dr. Patrice Paradise on 09/30/2013 at 1533 hours.   Electronically Signed   By: Ulyses Southward M.D.   On: 09/30/2013 15:36   Dg Chest Portable 1 View  09/30/2013   CLINICAL DATA:  Abdominal pain.  Diarrhea.  EXAM: PORTABLE CHEST - 1 VIEW  COMPARISON:  06/11/2012  FINDINGS: AICD in place. PICC in good position in the superior vena cava above the cavoatrial junction 2 cm below the carina.  Heart size and pulmonary vascularity are normal. Lungs are clear. No acute osseous abnormality.  IMPRESSION: No acute abnormalities.   Electronically Signed   By: Geanie Cooley M.D.   On: 09/30/2013 19:19    CBC  Recent Labs Lab 09/29/13 1516 09/30/13 1705 10/01/13 0540 10/02/13 0520 10/03/13 0545  WBC 16.2* 12.5* 11.1* 9.7 18.2*  HGB 11.6* 11.2* 10.1*  10.5* 9.9*  HCT 35.0* 33.9* 31.5* 33.2* 31.4*  PLT 607.0* 553*  482* 495* 436*  MCV 88.2 88.3 89.5 90.0 89.7  MCH  --  29.2 28.7 28.5 28.3  MCHC 33.1 33.0 32.1 31.6 31.5  RDW 13.1 13.1 13.2 13.0 13.4  LYMPHSABS 3.8 3.2 2.8  --   --   MONOABS 1.2* 1.1* 1.1*  --   --   EOSABS 0.2 0.1 0.1  --   --   BASOSABS 0.2* 0.0 0.0  --   --     Chemistries   Recent Labs Lab 09/29/13 1516 09/30/13 1705 10/01/13 0540 10/02/13 0520 10/03/13 0545  NA 132* 131* 134* 137 135*  K 5.2* 4.9 4.9 4.8 4.9  CL 94* 94* 98 100 98  CO2 26 23 23 20 20   GLUCOSE 99 97 94 90 100*  BUN 27* 24* 20 17 17   CREATININE 1.1 0.90 0.82 0.75 0.80  CALCIUM 10.2 9.8 9.5 9.7 9.0  AST 16 17 16   --   --   ALT 21 19 17   --   --   ALKPHOS 191* 197* 190*  --   --   BILITOT 0.3 0.2* 0.2*  --   --    ------------------------------------------------------------------------------------------------------------------ estimated creatinine clearance is 69.3 ml/min (by C-G formula based on Cr of 0.8). ------------------------------------------------------------------------------------------------------------------ No results found for this basename: HGBA1C,  in the last 72 hours ------------------------------------------------------------------------------------------------------------------ No results found for this basename: CHOL, HDL, LDLCALC, TRIG, CHOLHDL, LDLDIRECT,  in the last 72 hours ------------------------------------------------------------------------------------------------------------------ No results found for this basename: TSH, T4TOTAL, FREET3, T3FREE, THYROIDAB,  in the last 72 hours ------------------------------------------------------------------------------------------------------------------ No results found for this basename: VITAMINB12, FOLATE, FERRITIN, TIBC, IRON, RETICCTPCT,  in the last 72 hours  Coagulation profile No results found for this basename: INR, PROTIME,  in the last 168 hours  No results found for this basename: DDIMER,  in the last 72  hours  Cardiac Enzymes No results found for this basename: CK, CKMB, TROPONINI, MYOGLOBIN,  in the last 168 hours ------------------------------------------------------------------------------------------------------------------ No components found with this basename: POCBNP,     Primitivo Merkey D.O. on 10/03/2013 at 8:52 AM  Between 7am to 7pm - Pager - [phone]  After 7pm go to [email or web address] - password [email or web address]  And look for the night coverage person covering for me after hours  [facility] Office  [phone]

## 2013-10-03 NOTE — Care Management Note (Addendum)
  Page 2 of 2   10/08/2013     10:26:03 AM CARE MANAGEMENT NOTE 10/08/2013  Patient:  Ashley [name], Ashley [name]   Account Number:  [id number]  Date Initiated:  10/03/2013  Documentation initiated by:  Magdalen Spatz  Subjective/Objective Assessment:     Action/Plan:   Anticipated DC Date:     Anticipated DC Plan:           Choice offered to / List presented to:  [name]        [place] arranged  HH-1 RN      [place]   Status of service:  Completed, signed off Medicare Important Message given?   (If response is "NO", the following Medicare IM given date fields will be blank) Date Medicare IM given:   Date Additional Medicare IM given:  10/07/2013  Discharge Disposition:    Per UR Regulation:  Reviewed for med. necessity/level of care/duration of stay  If discussed at [place] of Stay Meetings, dates discussed:    Comments:  Husband [phone]  10-08-13 Ashley [name] with [facility] aware patient is discharging today . Magdalen Spatz RN BSN [phone]     10-07-13 Spoke with patient regarding home health , she wants to continue with [facility] ( phone [phone], fax [phone] )  and is requesting [name] . Confirmed with [name] patient will not need IV ABX or dressing changes at discharge . HHRN is for ostomy care / teaching.  Called Ashley [name] at [name] and faxed referral with request of Ashley [name].  Ashley [name] with [name] aware patinet will npot need IV ABX at discharge .  Magdalen Spatz RN BSN [phone]    10-03-13 Prior to admission patinet active with [name] [phone] ) and [name] ) , both companies aware patient has been admitted to [name] . Resumption of  care orders needed. Magdalen Spatz RN BSN

## 2013-10-03 NOTE — Progress Notes (Signed)
Notified Dr. Dalbert Batman that patient's postop dressing was taking off by [facility] nurse due to postop dressing  impeding the changing of ostomy bag and patient stating that the adhesive to postop dressing was causing a burning sensation to her skin .

## 2013-10-03 NOTE — Progress Notes (Addendum)
1 Day Post-Op  Subjective: Stable and alert. States she had a bad night because of pain control. Denies nausea. Denies shortness of breath.  Heart rate 88. BP 104/48. SpO2 96% on 2 L. Pain score 4/10  Hemoglobin 9.9. WBC 18,200. Potassium 4.9. Creatinine 0.8. Glucose 100  Objective: Vital signs in last 24 hours: Temp:  [98 F (36.7 C)-98.6 F (37 C)] 98.6 F (37 C) (05/22 0609) Pulse Rate:  [67-90] 88 (05/22 0609) Resp:  [12-28] 17 (05/22 0609) BP: (104-126)/(46-59) 104/48 mmHg (05/22 0609) SpO2:  [93 %-98 %] 96 % (05/22 0609) Last BM Date: 09/30/13  Intake/Output from previous day: 05/21 0701 - 05/22 0700 In: 2473 [I.V.:2473] Out: 1025 [Urine:925; Blood:100] Intake/Output this shift:    General appearance: alert. Mild to moderate distress from pain. Skin warm and dry. Anxious, but mental status otherwise normal. Resp: clear to auscultation bilaterally GI: Abdomen is actually quite soft, but she complains of tenderness to very light touch. Midline wound intact and clean. Stoma pink and viable.  Lab Results:  Results for orders placed during the hospital encounter of 09/30/13 (from the past 24 hour(s))  GLUCOSE, CAPILLARY     Status: Abnormal   Collection Time    10/02/13  5:50 PM      Result Value Ref Range   Glucose-Capillary 103 (*) 70 - 99 mg/dL  GLUCOSE, CAPILLARY     Status: Abnormal   Collection Time    10/03/13 12:03 AM      Result Value Ref Range   Glucose-Capillary 100 (*) 70 - 99 mg/dL  CBC     Status: Abnormal   Collection Time    10/03/13  5:45 AM      Result Value Ref Range   WBC 18.2 (*) 4.0 - 10.5 K/uL   RBC 3.50 (*) 3.87 - 5.11 MIL/uL   Hemoglobin 9.9 (*) 12.0 - 15.0 g/dL   HCT 31.4 (*) 36.0 - 46.0 %   MCV 89.7  78.0 - 100.0 fL   MCH 28.3  26.0 - 34.0 pg   MCHC 31.5  30.0 - 36.0 g/dL   RDW 13.4  11.5 - 15.5 %   Platelets 436 (*) 150 - 400 K/uL  BASIC METABOLIC PANEL     Status: Abnormal   Collection Time    10/03/13  5:45 AM      Result Value  Ref Range   Sodium 135 (*) 137 - 147 mEq/L   Potassium 4.9  3.7 - 5.3 mEq/L   Chloride 98  96 - 112 mEq/L   CO2 20  19 - 32 mEq/L   Glucose, Bld 100 (*) 70 - 99 mg/dL   BUN 17  6 - 23 mg/dL   Creatinine, Ser 0.80  0.50 - 1.10 mg/dL   Calcium 9.0  8.4 - 10.5 mg/dL   GFR calc non Af Amer 74 (*) >90 mL/min   GFR calc Af Amer 86 (*) >90 mL/min     Studies/Results: No results found.  . ertapenem  1 g Intravenous Q24H  . famotidine (PEPCID) IV  20 mg Intravenous Q24H  . fentaNYL   Intravenous 6 times per day  . heparin subcutaneous  5,000 Units Subcutaneous 3 times per day  . metoprolol  2.5 mg Intravenous 3 times per day     Assessment/Plan: s/p Procedure(s): PARTIAL COLECTOMY DESCENDING COLOSTOMY  POD #1.Sigmoid colectomy with descending colostomy for perforated sigmoid diverticulitis. Stable Continue Invanz. Continue fentanyl PCA. 4 hour limit and dose limit  increased last  night. Just as necessary Out of bed N.p.o. Except ice chips and sips.  DVT prophylaxis. On heparin subcutaneous  History of cardiomyopathy status post ICD placement. Chronic kidney disease. Creatinine current currently normal. Normocytic anemia. Superimposed acute blood loss anemia. Does not require transfusion Depression and anxiety. Currently holding Xanax. Hypertension. Currently well controlled and holding coreg nd spironolactone.   @PROBHOSP @  LOS: 3 days    Adin Hector 10/03/2013  . .prob

## 2013-10-03 NOTE — Consult Note (Addendum)
WOC ostomy consult note Stoma type/location: Colostomy to left lower quad from surgery on 5/21 Stomal assessment/size: Stoma red and viable, slightly above skin level, 1 1/2 inches. Peristomal assessment: Intact skin surrounding.   Output  Scant amt pink drainage, no stool or flatus. Ostomy pouching: 1pc Education provided: Demonstrated pouch change using barrier ring and one piece pouch.  Pt able to open and close spout to empty with velcro.  After applying one piece pouch with tape boarder, pt began c/o burning feeling where tape edges are located and states she has adhesive allergies. No blistering or erythremia was noted when previous bag was removed. Applied barrier ring and one piece pouch which is tape free.  These are not available in the [facility] and she was given 1 box of 10 samples to keep for use at the bedside.  Also c/o burning from abd post-op dressing which had to be removed R/T it was applied in close proximity to ostomy pouch when changed.  Notified bedside nurse, who plans to contact surgical team who is following for further plan of care for dressing change orders.  Educational materials left at bedside and pt asked appropriate questions.  Husband at bedside to watch pouch application demonstration. Placed on [place] discharge program. Julien Girt MSN, RN, [place], [place], [place]

## 2013-10-04 DIAGNOSIS — K651 Peritoneal abscess: Secondary | ICD-10-CM | POA: Diagnosis not present

## 2013-10-04 DIAGNOSIS — F410 Panic disorder [episodic paroxysmal anxiety] without agoraphobia: Secondary | ICD-10-CM | POA: Diagnosis not present

## 2013-10-04 DIAGNOSIS — I428 Other cardiomyopathies: Secondary | ICD-10-CM | POA: Diagnosis not present

## 2013-10-04 DIAGNOSIS — D649 Anemia, unspecified: Secondary | ICD-10-CM | POA: Diagnosis not present

## 2013-10-04 LAB — CBC
HCT: 28.1 % — ABNORMAL LOW (ref 36.0–46.0)
Hemoglobin: 9 g/dL — ABNORMAL LOW (ref 12.0–15.0)
MCH: 28.8 pg (ref 26.0–34.0)
MCHC: 32 g/dL (ref 30.0–36.0)
MCV: 90.1 fL (ref 78.0–100.0)
Platelets: 309 10*3/uL (ref 150–400)
RBC: 3.12 MIL/uL — ABNORMAL LOW (ref 3.87–5.11)
RDW: 13.2 % (ref 11.5–15.5)
WBC: 18.5 10*3/uL — ABNORMAL HIGH (ref 4.0–10.5)

## 2013-10-04 LAB — BASIC METABOLIC PANEL
BUN: 13 mg/dL (ref 6–23)
CO2: 24 mEq/L (ref 19–32)
Calcium: 8.9 mg/dL (ref 8.4–10.5)
Chloride: 98 mEq/L (ref 96–112)
Creatinine, Ser: 0.73 mg/dL (ref 0.50–1.10)
GFR calc Af Amer: >90 mL/min (ref >90)
GFR calc non Af Amer: 86 mL/min — ABNORMAL LOW (ref >90)
Glucose, Bld: 84 mg/dL (ref 70–99)
Potassium: 4.3 mEq/L (ref 3.7–5.3)
Sodium: 135 mEq/L — ABNORMAL LOW (ref 137–147)

## 2013-10-04 LAB — GLUCOSE, CAPILLARY
Glucose-Capillary: 79 mg/dL (ref 70–99)
Glucose-Capillary: 83 mg/dL (ref 70–99)
Glucose-Capillary: 88 mg/dL (ref 70–99)
Glucose-Capillary: 92 mg/dL (ref 70–99)

## 2013-10-04 MED ORDER — ALPRAZOLAM 0.25 MG PO TABS
0.2500 mg | ORAL_TABLET | Freq: Once | ORAL | Status: AC
  Administered 2013-10-04: 0.25 mg via ORAL
  Filled 2013-10-04: qty 1

## 2013-10-04 MED ORDER — ALPRAZOLAM ER 0.5 MG PO TB24: 0.5000 mg | ORAL_TABLET | Freq: Every day | ORAL | Status: DC

## 2013-10-04 NOTE — Progress Notes (Signed)
2 Days Post-Op  Subjective: Stable and alert. Complains of burning and allergic reaction with tape. States she wants to continue to use the PCA.  Says she wants to drink more. No vomiting. No significant output from colostomy. Foley out. Urine output okay. Got up to a chair but no ambulation yet.  Labs show hemoglobin 9.0, WBC 18,500. Potassium 4.3. Creatinine 0.73. Glucose 84. Objective: Vital signs in last 24 hours: Temp:  [97.7 F (36.5 C)-98.4 F (36.9 C)] 97.7 F (36.5 C) (05/23 0522) Pulse Rate:  [87-92] 92 (05/23 0522) Resp:  [10-24] 24 (05/23 0522) BP: (104-120)/(44-51) 120/51 mmHg (05/23 0522) SpO2:  [93 %-98 %] 97 % (05/23 0522) Weight:  [158 lb 3.2 oz (71.759 kg)] 158 lb 3.2 oz (71.759 kg) (05/22 0926) Last BM Date: 09/30/13  Intake/Output from previous day: 05/22 0701 - 05/23 0700 In: 983.8 [I.V.:983.8] Out: 475 [Urine:475] Intake/Output this shift: Total I/O In: -  Out: 100 [Urine:100]  General appearance: pale and alert. Does not appear to be any significant physical distress. Talkative. And seems appropriately unhappy  about all events. Resp: clear to auscultation bilaterally GI: abdomen seems soft. Hypoactive bowel sounds. Midline wound clean. Skin healthy. No rashes. And very touchy everywhere but appropriate considering recent surgery.  Lab Results:  Results for orders placed during the hospital encounter of 09/30/13 (from the past 24 hour(s))  GLUCOSE, CAPILLARY     Status: Abnormal   Collection Time    10/03/13 11:41 AM      Result Value Ref Range   Glucose-Capillary 112 (*) 70 - 99 mg/dL  GLUCOSE, CAPILLARY     Status: Abnormal   Collection Time    10/03/13  5:28 PM      Result Value Ref Range   Glucose-Capillary 100 (*) 70 - 99 mg/dL  GLUCOSE, CAPILLARY     Status: None   Collection Time    10/04/13 12:22 AM      Result Value Ref Range   Glucose-Capillary 88  70 - 99 mg/dL   Comment 1 Notify RN    CBC     Status: Abnormal   Collection Time   10/04/13  5:00 AM      Result Value Ref Range   WBC 18.5 (*) 4.0 - 10.5 K/uL   RBC 3.12 (*) 3.87 - 5.11 MIL/uL   Hemoglobin 9.0 (*) 12.0 - 15.0 g/dL   HCT 28.1 (*) 36.0 - 46.0 %   MCV 90.1  78.0 - 100.0 fL   MCH 28.8  26.0 - 34.0 pg   MCHC 32.0  30.0 - 36.0 g/dL   RDW 13.2  11.5 - 15.5 %   Platelets 309  150 - 400 K/uL  BASIC METABOLIC PANEL     Status: Abnormal   Collection Time    10/04/13  5:00 AM      Result Value Ref Range   Sodium 135 (*) 137 - 147 mEq/L   Potassium 4.3  3.7 - 5.3 mEq/L   Chloride 98  96 - 112 mEq/L   CO2 24  19 - 32 mEq/L   Glucose, Bld 84  70 - 99 mg/dL   BUN 13  6 - 23 mg/dL   Creatinine, Ser 0.73  0.50 - 1.10 mg/dL   Calcium 8.9  8.4 - 10.5 mg/dL   GFR calc non Af Amer 86 (*) >90 mL/min   GFR calc Af Amer >90  >90 mL/min  GLUCOSE, CAPILLARY     Status: None   Collection Time  10/04/13  6:24 AM      Result Value Ref Range   Glucose-Capillary 79  70 - 99 mg/dL     Studies/Results: No results found.  . ertapenem  1 g Intravenous Q24H  . famotidine (PEPCID) IV  20 mg Intravenous Q24H  . fentaNYL   Intravenous 6 times per day  . heparin subcutaneous  5,000 Units Subcutaneous 3 times per day  . metoprolol  2.5 mg Intravenous 3 times per day     Assessment/Plan: s/p Procedure(s): PARTIAL COLECTOMY DESCENDING COLOSTOMY  POD #2.Sigmoid colectomy with descending colostomy for perforated sigmoid diverticulitis.  Stable  Continue Invanz.  Foley out. Continue fentanyl PCA. 4 hour limit and dose limit increased last night.   Clear liquid diet. Try to ambulate. PT involved.  DVT prophylaxis. On heparin subcutaneous  History of cardiomyopathy status post ICD placement.  Chronic kidney disease. Creatinine current currently normal.  Normocytic anemia. Superimposed acute blood loss anemia. Does not require transfusion  Depression and anxiety.  Will start Xanax. Hypertension. Currently well controlled and holding coreg and spironolactone.Probably  will restart once diet established   @PROBHOSP @  LOS: 4 days    Adin Hector 10/04/2013  . .prob

## 2013-10-04 NOTE — Progress Notes (Addendum)
[facility]                                                                              Patient Demographics  Ashley [name], is a 68 y.o. female, DOB - 1945-07-11, GEX:[id number]  Admit date - 09/30/2013   Admitting Physician Eduard Clos, MD  Outpatient Primary MD for the patient is [name], MD  LOS - 4   Chief Complaint  Patient presents with  . Abdominal Pain  . Diarrhea   Interim History:   Ashley [name] is a 68 y.o. female history of cardiomyopathy and chronic kidney disease was recently admitted at [facility] in [place] for ruptured diverticulitis with abscess. Patient initially had laparoscopic washout and drain placed followed by percutaneous drain placement once patient was found to have persistent abscess. Last week patient's drain was removed and last Friday 5 days ago patient was discharged home on imipenem with PICC line. Patient followed with primary care physician yesterday and was found to have persistent leukocytosis and primary care physician ordered CT abdomen and pelvis which shows abscess connecting to colon. Dr. Violeta Gelinas was consulted and at this time patient has been admitted for further management.   Patient underwent partial colectomy/descending colostomy.  She continues to have significiant pain and requires PCA pump.  She has been advanced to a clear liquid diet.  General surgery has so graciously accepted to take over management of this patient.    Once patient is able to tolerate more PO intake, would transition patient to her home medications of Coreg and spironolactone and discontinue IV metoprolol.    Currently, patient's medical conditions are stable.  [facility] is available for consult if needed.  Otherwise, signing off at this time.    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 -Continues to need fentanyl PCA. -Physical therapy consulted.  History of cardiomyopathy status post ICD placement -Last echocardiogram in October of 2014 shows an EF of 50-55% -Will continue to monitor patient's daily weights as well as intake and output  Chronic kidney disease, stage 3-4 -Currently creatinine 0.8 appears to be below baseline -Will continue to monitor BMP  Normocytic Anemia -Baseline hemoglobin appears to be 10-11 -Currently 9.0, will continue to monitor CBC -Likely drop in hemoglobin secondary to surgery  Thrombocytosis -Likely reactive -Admission, 607, currently trending downward 309  Depression/Anxiety -Xanax restarted  Hypertension -Continue metoprolol IV -Holding home medications of Coreg and spironolactone, will restart once patient's diet has been advanced.  Code Status: Full  Family Communication: None at bedside  Disposition Plan: Admitted  Time Spent in minutes   25 minutes  Procedures  Sigmoid colectomy with descending colostomy (Hartman's procedure)  Consults  General surgery Cardiology  DVT Prophylaxis  SCDs  Lab Results  Component Value Date   PLT 309 10/04/2013    Medications  Scheduled Meds: . [START ON 10/05/2013] ALPRAZolam  0.5 mg Oral Daily  . ertapenem  1 g Intravenous Q24H  . famotidine (PEPCID) IV  20 mg Intravenous Q24H  . fentaNYL   Intravenous 6 times per day  . heparin subcutaneous  5,000 Units Subcutaneous 3 times per  day  . metoprolol  2.5 mg Intravenous 3 times per day   Continuous Infusions: . lactated ringers 75 mL/hr at 10/04/13 0820   PRN Meds:.acetaminophen, acetaminophen, diphenhydrAMINE, diphenhydrAMINE, naloxone, ondansetron (ZOFRAN) IV, ondansetron, oxyCODONE-acetaminophen, sodium chloride, sodium chloride  Antibiotics    Anti-infectives   Start     Dose/Rate Route Frequency Ordered Stop   09/30/13 2200  ertapenem (INVANZ) 1 g in sodium chloride 0.9 % 50 mL IVPB     1 g 100 mL/hr over 30 Minutes Intravenous Every 24 hours 09/30/13 2147     09/30/13 1745  ertapenem (INVANZ) 1 g in sodium chloride 0.9 % 50 mL IVPB     1 g 100 mL/hr over 30 Minutes Intravenous  Once 09/30/13 1732 09/30/13 2045      Subjective:   Ashley [name] seen and examined today.  Patient continues to be in pain, more on the right side. She does state she was able to tolerate some chicken broth this morning. Patient denies any chest pain, shortness of breath at this time.  Objective:   Filed Vitals:   10/04/13 0200 10/04/13 0419 10/04/13 0522 10/04/13 0815  BP: 114/48  120/51   Pulse: 87  92   Temp: 98 F (36.7 C)  97.7 F (36.5 C)   TempSrc: Oral  Oral   Resp: 20 21 24 23   Height:      Weight:      SpO2: 98% 98% 97% 97%    Wt Readings from Last 3 Encounters:  10/03/13 71.759 kg (158 lb 3.2 oz)  10/03/13 71.759 kg (158 lb 3.2 oz)  09/29/13 71.101 kg (156 lb 12 oz)     Intake/Output Summary (Last 24 hours) at 10/04/13 0857 Last data filed at 10/04/13 0500  Gross per 24 hour  Intake 983.75 ml  Output    475 ml  Net 508.75 ml    Exam  General: Well developed, well nourished, NAD, appears stated age  HEENT: NCAT,  mucous membranes moist.  Neck: Supple, no JVD, no masses  Cardiovascular: S1 S2 auscultated, no rubs, murmurs or gallops. Regular rate and rhythm.  Respiratory: Clear to auscultation bilaterally with equal chest rise  Abdomen: Soft, diffuse tenderness to light touch,  nondistended, + hypoactive bowel sounds, ostomy bag in place, stoma pink  Extremities: warm dry without cyanosis clubbing or edema  Neuro: AAOx3, no focal deficits  Skin: Without rashes exudates or nodules  Psych: Depressed however appropriate mood and affect the situation.  Data Review   Micro Results Recent Results (from the past 240 hour(s))  CULTURE, BLOOD (ROUTINE X 2)     Status: None   Collection Time    09/30/13  7:52 PM      Result Value Ref Range Status   Specimen Description BLOOD ARM LEFT   Final   Special Requests BOTTLES DRAWN AEROBIC AND ANAEROBIC 5CC   Final   Culture  Setup Time     Final   Value: 10/01/2013 01:03     Performed at [facility]  Final   Value:        BLOOD CULTURE RECEIVED NO GROWTH TO DATE CULTURE WILL BE HELD FOR 5 DAYS BEFORE ISSUING A FINAL NEGATIVE REPORT     Performed at [facility]   Report Status PENDING   Incomplete  SURGICAL PCR SCREEN     Status: None   Collection Time    10/01/13  6:20 PM      Result Value Ref Range Status   MRSA, PCR NEGATIVE  NEGATIVE Final   Staphylococcus aureus NEGATIVE  NEGATIVE Final   Comment:            The Xpert SA Assay (FDA     approved for NASAL specimens     in patients over 69 years of age),     is one component of     a comprehensive surveillance     program.  Test performance has     been validated by [employer] for patients greater     than or equal to 4 year old.     It is not intended     to diagnose infection nor to     guide or monitor treatment.    Radiology Reports Ct Abdomen Pelvis W Contrast  09/30/2013   CLINICAL DATA:  Sigmoid diverticulitis, followup, recent laparoscopic surgery with 3 abscess drains at an outside facility.  EXAM: CT ABDOMEN AND PELVIS WITH CONTRAST  TECHNIQUE: Multidetector CT imaging of the abdomen and pelvis was performed using the standard protocol following bolus administration of intravenous contrast. Sagittal and  coronal MPR images reconstructed from axial data set.  CONTRAST:  80mL OMNIPAQUE IOHEXOL 300 MG/ML SOLN IV. Dilute oral contrast.  COMPARISON:  None  FINDINGS: Dependent atelectasis at both lung bases.  AICD lead in RIGHT ventricle.  Small diaphragmatic defect at posterior medial LEFT diaphragm containing fat.  Tiny BILATERAL renal cysts.  Liver, spleen, pancreas, kidneys, and adrenal glands otherwise normal.  Uterus surgically absent with nonvisualization of ovaries and appendix.  Bladder decompressed.  Extraluminal collection of gas, fluid and contrast identified in central pelvis, superior to and extending to the urinary bladder, located between small bowel loops and sigmoid colon, measuring approximately 7.4 cm in length, up to 3.4 cm transverse and 2.5 cm AP.  Finding is compatible with a abscess collection which communicates with the GI tract.  Free intraperitoneal identified in the LEFT pelvis extending from image 69 through image 77.  Diverticulosis of the distal descending and sigmoid colon with sigmoid wall thickening and persistent pericolic inflammatory changes at the sigmoid mesocolon.  Stomach and remaining bowel loops unremarkable.  No additional abscess collections identified.  No surgical drains identified.  No mass, adenopathy, ascites, or acute osseous findings.  IMPRESSION: Sigmoid diverticulitis.  Abscess collection within the lower central pelvis which communicates with the GI tract, containing GI contrast, measuring in maximal dimensions 7.4 cm CC x 3.4 cm transverse x 2.5 cm AP.  Free intraperitoneal air in LEFT pelvis ; this could be from drainage catheters if they were recently removed or from continued intraperitoneal bowel perforation/leak.  Findings called to Dr. Patrice Paradise on 09/30/2013 at 1533 hours.   Electronically Signed   By: Ulyses Southward M.D.   On: 09/30/2013 15:36   Dg Chest Portable 1 View  09/30/2013   CLINICAL DATA:  Abdominal pain.  Diarrhea.  EXAM: PORTABLE CHEST - 1 VIEW   COMPARISON:  06/11/2012  FINDINGS: AICD in place. PICC in good position in  the superior vena cava above the cavoatrial junction 2 cm below the carina.  Heart size and pulmonary vascularity are normal. Lungs are clear. No acute osseous abnormality.  IMPRESSION: No acute abnormalities.   Electronically Signed   By: Geanie Cooley M.D.   On: 09/30/2013 19:19    CBC  Recent Labs Lab 09/29/13 1516 09/30/13 1705 10/01/13 0540 10/02/13 0520 10/03/13 0545 10/04/13 0500  WBC 16.2* 12.5* 11.1* 9.7 18.2* 18.5*  HGB 11.6* 11.2* 10.1* 10.5* 9.9* 9.0*  HCT 35.0* 33.9* 31.5* 33.2* 31.4* 28.1*  PLT 607.0* 553* 482* 495* 436* 309  MCV 88.2 88.3 89.5 90.0 89.7 90.1  MCH  --  29.2 28.7 28.5 28.3 28.8  MCHC 33.1 33.0 32.1 31.6 31.5 32.0  RDW 13.1 13.1 13.2 13.0 13.4 13.2  LYMPHSABS 3.8 3.2 2.8  --   --   --   MONOABS 1.2* 1.1* 1.1*  --   --   --   EOSABS 0.2 0.1 0.1  --   --   --   BASOSABS 0.2* 0.0 0.0  --   --   --     Chemistries   Recent Labs Lab 09/29/13 1516 09/30/13 1705 10/01/13 0540 10/02/13 0520 10/03/13 0545 10/04/13 0500  NA 132* 131* 134* 137 135* 135*  K 5.2* 4.9 4.9 4.8 4.9 4.3  CL 94* 94* 98 100 98 98  CO2 26 23 23 20 20 24   GLUCOSE 99 97 94 90 100* 84  BUN 27* 24* 20 17 17 13   CREATININE 1.1 0.90 0.82 0.75 0.80 0.73  CALCIUM 10.2 9.8 9.5 9.7 9.0 8.9  AST 16 17 16   --   --   --   ALT 21 19 17   --   --   --   ALKPHOS 191* 197* 190*  --   --   --   BILITOT 0.3 0.2* 0.2*  --   --   --    ------------------------------------------------------------------------------------------------------------------ estimated creatinine clearance is 66.8 ml/min (by C-G formula based on Cr of 0.73). ------------------------------------------------------------------------------------------------------------------ No results found for this basename: HGBA1C,  in the last 72  hours ------------------------------------------------------------------------------------------------------------------ No results found for this basename: CHOL, HDL, LDLCALC, TRIG, CHOLHDL, LDLDIRECT,  in the last 72 hours ------------------------------------------------------------------------------------------------------------------ No results found for this basename: TSH, T4TOTAL, FREET3, T3FREE, THYROIDAB,  in the last 72 hours ------------------------------------------------------------------------------------------------------------------ No results found for this basename: VITAMINB12, FOLATE, FERRITIN, TIBC, IRON, RETICCTPCT,  in the last 72 hours  Coagulation profile No results found for this basename: INR, PROTIME,  in the last 168 hours  No results found for this basename: DDIMER,  in the last 72 hours  Cardiac Enzymes No results found for this basename: CK, CKMB, TROPONINI, MYOGLOBIN,  in the last 168 hours ------------------------------------------------------------------------------------------------------------------ No components found with this basename: POCBNP,     Mandalyn Pasqua D.O. on 10/04/2013 at 8:57 AM  Between 7am to 7pm - Pager - [phone]  After 7pm go to [email or web address] - password [email or web address]  And look for the night coverage person covering for me after hours  [facility] Office  [phone]

## 2013-10-05 LAB — BASIC METABOLIC PANEL
BUN: 14 mg/dL (ref 6–23)
CO2: 25 mEq/L (ref 19–32)
Calcium: 8.8 mg/dL (ref 8.4–10.5)
Chloride: 97 mEq/L (ref 96–112)
Creatinine, Ser: 0.72 mg/dL (ref 0.50–1.10)
GFR calc Af Amer: >90 mL/min (ref >90)
GFR calc non Af Amer: 86 mL/min — ABNORMAL LOW (ref >90)
Glucose, Bld: 101 mg/dL — ABNORMAL HIGH (ref 70–99)
Potassium: 4.5 mEq/L (ref 3.7–5.3)
Sodium: 134 mEq/L — ABNORMAL LOW (ref 137–147)

## 2013-10-05 LAB — CBC
HCT: 27 % — ABNORMAL LOW (ref 36.0–46.0)
Hemoglobin: 8.8 g/dL — ABNORMAL LOW (ref 12.0–15.0)
MCH: 28.9 pg (ref 26.0–34.0)
MCHC: 32.6 g/dL (ref 30.0–36.0)
MCV: 88.5 fL (ref 78.0–100.0)
Platelets: 335 10*3/uL (ref 150–400)
RBC: 3.05 MIL/uL — ABNORMAL LOW (ref 3.87–5.11)
RDW: 13.4 % (ref 11.5–15.5)
WBC: 16.7 10*3/uL — ABNORMAL HIGH (ref 4.0–10.5)

## 2013-10-05 LAB — GLUCOSE, CAPILLARY
Glucose-Capillary: 84 mg/dL (ref 70–99)
Glucose-Capillary: 95 mg/dL (ref 70–99)
Glucose-Capillary: 95 mg/dL (ref 70–99)
Glucose-Capillary: 99 mg/dL (ref 70–99)

## 2013-10-05 MED ORDER — HYDROMORPHONE HCL PF 1 MG/ML IJ SOLN
0.5000 mg | INTRAMUSCULAR | Status: DC | PRN
  Administered 2013-10-05: 0.5 mg via INTRAVENOUS
  Filled 2013-10-05: qty 1

## 2013-10-05 MED ORDER — FAMOTIDINE 20 MG PO TABS: 20.0000 mg | ORAL_TABLET | Freq: Two times a day (BID) | ORAL | Status: DC

## 2013-10-05 MED ORDER — OXYCODONE HCL 5 MG PO TABS
2.5000 mg | ORAL_TABLET | ORAL | Status: DC | PRN
  Administered 2013-10-06 – 2013-10-07 (×7): 2.5 mg via ORAL
  Administered 2013-10-07: 21:00:00 via ORAL
  Administered 2013-10-08 (×2): 2.5 mg via ORAL
  Filled 2013-10-05 (×2): qty 1

## 2013-10-05 MED ORDER — ACETAMINOPHEN 325 MG PO TABS
325.0000 mg | ORAL_TABLET | Freq: Four times a day (QID) | ORAL | Status: DC | PRN
  Administered 2013-10-06 – 2013-10-08 (×5): 325 mg via ORAL
  Filled 2013-10-05 (×5): qty 1

## 2013-10-05 MED ORDER — ACETAMINOPHEN 650 MG RE SUPP: 650.0000 mg | Freq: Four times a day (QID) | RECTAL | Status: DC | PRN

## 2013-10-05 MED ORDER — SPIRONOLACTONE 12.5 MG HALF TABLET
12.5000 mg | ORAL_TABLET | Freq: Every day | ORAL | Status: DC
  Administered 2013-10-06 – 2013-10-07 (×2): 12.5 mg via ORAL
  Filled 2013-10-05 (×3): qty 1

## 2013-10-05 MED ORDER — ALPRAZOLAM 0.25 MG PO TABS: 0.2500 mg | ORAL_TABLET | Freq: Once | ORAL | Status: DC

## 2013-10-05 MED ORDER — DEXLANSOPRAZOLE 60 MG PO CPDR
60.0000 mg | DELAYED_RELEASE_CAPSULE | Freq: Every day | ORAL | Status: DC
  Administered 2013-10-06 – 2013-10-08 (×3): 60 mg via ORAL
  Filled 2013-10-05: qty 1

## 2013-10-05 MED ORDER — CARVEDILOL 12.5 MG PO TABS
6.2500 mg | ORAL_TABLET | Freq: Two times a day (BID) | ORAL | Status: DC
  Administered 2013-10-05 – 2013-10-08 (×6): 6.25 mg via ORAL
  Filled 2013-10-05 (×2): qty 1
  Filled 2013-10-05: qty 0.5

## 2013-10-05 NOTE — Progress Notes (Signed)
Custom dose Fentanyl PCA syringe returned to [facility] in pharmacy due to medication being discontinued.

## 2013-10-05 NOTE — Progress Notes (Signed)
Fentanyl PCA discontinue per MD order. 7.8 mL of Fentanyl wasted in sink with Daiva Nakayama, RN servicing as witness.

## 2013-10-05 NOTE — Consult Note (Addendum)
Stoma type/location: Colostomy to left lower quad from surgery on 5/21 Stomal assessment/size: Stoma red and viable, slightly above skin level, 1 1/2 inches. Peristomal assessment: Intact skin surrounding.   Output  Scant amt pink drainage, no stool, having flatus. Ostomy pouching: 1pc Education provided: Demonstrated pouch change using barrier ring and one piece pouch.  Pt able to open and close spout to empty with velcro. No blistering or erythremia was noted underneath barrier of tape-free ostomy pouch. These are not available in the [facility] and she was given 1 box of 8 samples to keep for use at the bedside.  Pt asked appropriate questions and discussed pouching routines and ordering supplies.  Husband at bedside to watch pouch application demonstration. Placed on [facility] discharge program. Julien Girt MSN, RN, [place], [place], [place]

## 2013-10-05 NOTE — Progress Notes (Signed)
3 Days Post-Op  Subjective: Tolerating clear liquid diet without nausea. Lots of selections are "too acidy for me".  No significant ostomy output. Doesn't want to wear any gown or clothes, stating that it burns  her skin. Voiding without difficulty.  getting up to chair but unable to ambulate in the hall yesterday.  Labs today reveal hemoglobin 8.8, WBC down to 16,700.  Objective: Vital signs in last 24 hours: Temp:  [98.1 F (36.7 C)-98.4 F (36.9 C)] 98.1 F (36.7 C) (05/24 0600) Pulse Rate:  [92-107] 106 (05/24 0600) Resp:  [16-26] 26 (05/24 0600) BP: (105-132)/(48-61) 123/61 mmHg (05/24 0600) SpO2:  [96 %-99 %] 96 % (05/24 0600) Weight:  [156 lb 8 oz (70.988 kg)] 156 lb 8 oz (70.988 kg) (05/23 1715) Last BM Date:  (pre op)  Intake/Output from previous day: 05/23 0701 - 05/24 0700 In: 2466.3 [P.O.:400; I.V.:2066.3] Out: 1150 [Urine:1150] Intake/Output this shift: Total I/O In: 2066.3 [I.V.:2066.3] Out: 350 [Urine:350]    EXAM: General appearance: alert. Mental status unchanged. No acute physical distress. She is anxious and depressed. Resp: clear to auscultation bilaterally GI: abdomen softer, not distended, bowel sounds present. I don't see any stool in the bag. Midline wound clean. Skin of chest and abdomen healthy, no skin rashes.  Lab Results:  Results for orders placed during the hospital encounter of 09/30/13 (from the past 24 hour(s))  GLUCOSE, CAPILLARY     Status: None   Collection Time    10/04/13 11:52 AM      Result Value Ref Range   Glucose-Capillary 83  70 - 99 mg/dL  GLUCOSE, CAPILLARY     Status: None   Collection Time    10/04/13  5:21 PM      Result Value Ref Range   Glucose-Capillary 92  70 - 99 mg/dL  GLUCOSE, CAPILLARY     Status: None   Collection Time    10/05/13 12:00 AM      Result Value Ref Range   Glucose-Capillary 95  70 - 99 mg/dL   Comment 1 Notify RN     Comment 2 Documented in Chart    CBC     Status: Abnormal   Collection  Time    10/05/13  5:05 AM      Result Value Ref Range   WBC 16.7 (*) 4.0 - 10.5 K/uL   RBC 3.05 (*) 3.87 - 5.11 MIL/uL   Hemoglobin 8.8 (*) 12.0 - 15.0 g/dL   HCT 27.0 (*) 36.0 - 46.0 %   MCV 88.5  78.0 - 100.0 fL   MCH 28.9  26.0 - 34.0 pg   MCHC 32.6  30.0 - 36.0 g/dL   RDW 13.4  11.5 - 15.5 %   Platelets 335  150 - 400 K/uL  GLUCOSE, CAPILLARY     Status: None   Collection Time    10/05/13  5:48 AM      Result Value Ref Range   Glucose-Capillary 95  70 - 99 mg/dL   Comment 1 Notify RN       Studies/Results: No results found.  . ALPRAZolam  0.5 mg Oral Daily  . ertapenem  1 g Intravenous Q24H  . famotidine  20 mg Oral BID  . heparin subcutaneous  5,000 Units Subcutaneous 3 times per day  . metoprolol  2.5 mg Intravenous 3 times per day     Assessment/Plan: s/p Procedure(s): PARTIAL COLECTOMY   POD #3.Sigmoid colectomy with descending colostomy for perforated sigmoid diverticulitis.  Stable  Continue Invanz.  D/c fentanyl PCA. Try to primarily uses oral oxycodone. Patient says has worked pretty well. Full liquid diet.  po. pepcid. Try to ambulate. PT involved.   DVT prophylaxis. On heparin subcutaneous   History of cardiomyopathy status post ICD placement.  Chronic kidney disease. Creatinine current currently normal.  Normocytic anemia. Superimposed acute blood loss anemia. Does not require transfusion   Depression and anxiety.  Suspect this is significantly affecting her motivation. On daily Xanax. Open to suggestion.   Hypertension. Currently well controlled and holding coreg and spironolactone.Probably can restart per internal medicine service.   @PROBHOSP @  LOS: 5 days    Adin Hector 10/05/2013  . .prob

## 2013-10-06 ENCOUNTER — Inpatient Hospital Stay (HOSPITAL_COMMUNITY): Payer: Medicare Other

## 2013-10-06 DIAGNOSIS — R935 Abnormal findings on diagnostic imaging of other abdominal regions, including retroperitoneum: Secondary | ICD-10-CM | POA: Diagnosis not present

## 2013-10-06 LAB — GLUCOSE, CAPILLARY
Glucose-Capillary: 101 mg/dL — ABNORMAL HIGH (ref 70–99)
Glucose-Capillary: 103 mg/dL — ABNORMAL HIGH (ref 70–99)
Glucose-Capillary: 88 mg/dL (ref 70–99)
Glucose-Capillary: 89 mg/dL (ref 70–99)
Glucose-Capillary: 93 mg/dL (ref 70–99)

## 2013-10-06 MED ORDER — POLYETHYLENE GLYCOL 3350 17 G PO PACK
17.0000 g | PACK | Freq: Once | ORAL | Status: DC
  Filled 2013-10-06: qty 1

## 2013-10-06 MED ORDER — ZOLPIDEM TARTRATE 5 MG PO TABS: 5.0000 mg | ORAL_TABLET | Freq: Every evening | ORAL | Status: DC | PRN

## 2013-10-06 NOTE — Progress Notes (Signed)
Physical Therapy Treatment Patient Details Name: GITANJALI MORMINO MRN: [id number] DOB: 03/27/1946 Today's Date: 10/06/2013    History of Present Illness MCKAILEY DEMAREST is a 68 y.o. female history of cardiomyopathy and chronic kidney disease was recently admitted at [facility] in [place] for ruptured diverticulitis with abscess. 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    PT Comments    Pt is progressing slowly, but daily with mobility and gait.  Pain and nausea are her biggest limiting factors.  She is using the RW, but just lightly for stability and will likely not need one for home use.  We followed her with the chair to try to encourage increased gait distance and confidence.  PT will continue to follow acutely.    Follow Up Recommendations  Home health PT;Supervision/Assistance - 24 hour     Equipment Recommendations  None recommended by PT    Recommendations for Other Services   NA     Precautions / Restrictions Precautions Precautions: Fall Precaution Comments: moving slowly    Mobility  Bed Mobility Overal bed mobility: Needs Assistance Bed Mobility: Sit to Supine       Sit to supine: +2 for physical assistance;Mod assist   General bed mobility comments: Pt reqested assist with bil legs and husband to support trunk to get back into the bed from sitting.  HOB elevated.   Transfers Overall transfer level: Needs assistance Equipment used: Rolling walker (2 wheeled) Transfers: Sit to/from Stand Sit to Stand: Min assist         General transfer comment: Min assist to support trunk during transition from low recliner chair.  Verbal cues for safe hand placement.  Ambulation/Gait Ambulation/Gait assistance: Min guard;+2  safety/equipment (chair to follow) Ambulation Distance (Feet): 80 Feet (with one seated rest break) Assistive device: Rolling walker (2 wheeled) Gait Pattern/deviations: Step-through pattern (pt only holding RW with one hand) Gait velocity: decreased Gait velocity interpretation: Below normal speed for age/gender General Gait Details: Pt holding RW with one hand and bracing pillow to abdomen with other hand.  She did ok with this method and only needed min assist at trunk for safety.  She was able to steer the RW without assist.           Balance Overall balance assessment: Needs assistance Sitting-balance support: Feet supported;No upper extremity supported Sitting balance-Leahy Scale: Good     Standing balance support: Bilateral upper extremity supported;No upper extremity supported Standing balance-Leahy Scale: Fair                      Cognition Arousal/Alertness: Awake/alert Behavior During Therapy: Anxious (baseline anxiety) Overall Cognitive Status: Within Functional Limits for tasks assessed                             Pertinent Vitals/Pain See vitals flow sheet.            PT Goals (current goals can now be found in the care plan section) Acute Rehab PT Goals Patient Stated Goal: To reduce pain Progress towards PT goals: Progressing toward goals    Frequency  Min 3X/week    PT Plan Current plan remains appropriate       End of Session   Activity Tolerance: Patient limited by  fatigue;Patient limited by pain Patient left: in bed;with call bell/phone within reach;with family/visitor present     Time: 1431-1459 PT Time Calculation (min): 28 min  Charges:  $Gait Training: 23-37 mins                      Shihab States B. Jonhatan Hearty, PT, DPT [phone]   10/06/2013, 3:48 PM

## 2013-10-06 NOTE — Progress Notes (Signed)
4 Days Post-Op  Subjective: Alert and stable. Afebrile. Normotensive on Coreg and spironolactone.  Ambulated in room a little bit yesterday  States she is unhappy. Reason given his nausea without vomiting. Passing flatus per ostomy according to her but no stool recorded. Stoma looks good per ostomy nurse.  When questioned about pain, she says it's a 5.    Objective: Vital signs in last 24 hours: Temp:  [97.9 F (36.6 C)-98.4 F (36.9 C)] 97.9 F (36.6 C) (05/25 0604) Pulse Rate:  [81-100] 81 (05/25 0604) Resp:  [20] 20 (05/25 0604) BP: (121-142)/(50-60) 142/60 mmHg (05/25 0604) SpO2:  [94 %] 94 % (05/25 0604) Weight:  [156 lb 4.8 oz (70.897 kg)-156 lb 7.6 oz (70.977 kg)] 156 lb 7.6 oz (70.977 kg) (05/25 0604) Last BM Date:  (pre op)  Intake/Output from previous day: 05/24 0701 - 05/25 0700 In: 2225 [P.O.:400; I.V.:1825] Out: 1550 [Urine:1550] Intake/Output this shift: Total I/O In: 1125 [I.V.:1125] Out: 1100 [Urine:1100]    EXAM: General appearance: alert. Does not appear to be any physical distress. Frowning and unhappy. Resp: clear to auscultation bilaterally GI: abdomen actually seems reasonably soft, active bowel sounds, nondistended. Midline wound clean. Stoma viable and budded  well. A little bit of gas in the bag.  Lab Results:  Results for orders placed during the hospital encounter of 09/30/13 (from the past 24 hour(s))  GLUCOSE, CAPILLARY     Status: None   Collection Time    10/05/13 12:03 PM      Result Value Ref Range   Glucose-Capillary 84  70 - 99 mg/dL  GLUCOSE, CAPILLARY     Status: None   Collection Time    10/05/13  5:27 PM      Result Value Ref Range   Glucose-Capillary 99  70 - 99 mg/dL  GLUCOSE, CAPILLARY     Status: Abnormal   Collection Time    10/05/13 11:59 PM      Result Value Ref Range   Glucose-Capillary 103 (*) 70 - 99 mg/dL   Comment 1 Notify RN     Comment 2 Documented in Chart       Studies/Results: No results found.  .  carvedilol  6.25 mg Oral BID WC  . dexlansoprazole  60 mg Oral Daily  . ertapenem  1 g Intravenous Q24H  . heparin subcutaneous  5,000 Units Subcutaneous 3 times per day  . polyethylene glycol  17 g Oral Once  . spironolactone  12.5 mg Oral Daily     Assessment/Plan: s/p Procedure(s): PARTIAL COLECTOMY DESCENDING COLOSTOMY  POD #4.Sigmoid colectomy with descending colostomy for perforated sigmoid diverticulitis.  Stable  Continue Invanz.  D/c fentanyl PCA. Dc dilaudid Try to primarily uses oral oxycodone. Patient says has worked pretty well.  Full liquid diet.  po. pepcid.  Try to ambulate. PT involved.   Nausea. Does not seem distended or obstructed, but we'll check abdominal films. Check labs tomorrow  DVT prophylaxis. On heparin subcutaneous   History of cardiomyopathy status post ICD placement.  Chronic kidney disease. Creatinine current currently normal.  Normocytic anemia. Superimposed acute blood loss anemia. Does not require transfusion   Depression and anxiety. Suspect this is significantly affecting her motivation. On daily Xanax. Will give low dose and me and at bedtime, per patient request. Open to suggestion.   Hypertension. Coreg and Spironolactone restarted. Good control.  @PROBHOSP @  LOS: 6 days    Adin Hector 10/06/2013  . .prob

## 2013-10-07 LAB — CULTURE, BLOOD (ROUTINE X 2): Culture: NO GROWTH

## 2013-10-07 LAB — BASIC METABOLIC PANEL
BUN: 9 mg/dL (ref 6–23)
CO2: 27 mEq/L (ref 19–32)
Calcium: 9 mg/dL (ref 8.4–10.5)
Chloride: 103 mEq/L (ref 96–112)
Creatinine, Ser: 0.75 mg/dL (ref 0.50–1.10)
GFR calc Af Amer: >90 mL/min (ref >90)
GFR calc non Af Amer: 85 mL/min — ABNORMAL LOW (ref >90)
Glucose, Bld: 103 mg/dL — ABNORMAL HIGH (ref 70–99)
Potassium: 4 mEq/L (ref 3.7–5.3)
Sodium: 141 mEq/L (ref 137–147)

## 2013-10-07 LAB — CBC
HCT: 26.1 % — ABNORMAL LOW (ref 36.0–46.0)
Hemoglobin: 8.6 g/dL — ABNORMAL LOW (ref 12.0–15.0)
MCH: 29.2 pg (ref 26.0–34.0)
MCHC: 33 g/dL (ref 30.0–36.0)
MCV: 88.5 fL (ref 78.0–100.0)
Platelets: 312 10*3/uL (ref 150–400)
RBC: 2.95 MIL/uL — ABNORMAL LOW (ref 3.87–5.11)
RDW: 13.3 % (ref 11.5–15.5)
WBC: 9.7 10*3/uL (ref 4.0–10.5)

## 2013-10-07 LAB — GLUCOSE, CAPILLARY
Glucose-Capillary: 106 mg/dL — ABNORMAL HIGH (ref 70–99)
Glucose-Capillary: 98 mg/dL (ref 70–99)

## 2013-10-07 MED ORDER — SPIRONOLACTONE 25 MG PO TABS
25.0000 mg | ORAL_TABLET | Freq: Every day | ORAL | Status: DC
  Administered 2013-10-08: 25 mg via ORAL
  Filled 2013-10-07 (×2): qty 1

## 2013-10-07 NOTE — Progress Notes (Signed)
Should be able to go home tomorrow.  Willl not need IV antibiotics at home.  Those delivered to her were probably from prior hospitalization.  Ashley [name]. Dahlia Bailiff, MD, [place] [phone] [facility]

## 2013-10-07 NOTE — Progress Notes (Signed)
Patient ID: Ashley [name], female   DOB: 06/17/1945, 68 y.o.   MRN: [id number] 5 Days Post-Op  Subjective: Pt doing well.  Wanting solid food.  Had a leak last night with her ostomy bag.  No nausea.  Pain controlled with oral pain meds  Objective: Vital signs in last 24 hours: Temp:  [97.8 F (36.6 C)-98.1 F (36.7 C)] 97.8 F (36.6 C) (05/26 1962) Pulse Rate:  [78-91] 78 (05/26 0608) Resp:  [20] 20 (05/26 0608) BP: (121-141)/(50-63) 141/63 mmHg (05/26 0608) SpO2:  [96 %-98 %] 98 % (05/26 0608) Weight:  [152 lb 9.6 oz (69.219 kg)] 152 lb 9.6 oz (69.219 kg) (05/26 2297) Last BM Date: 10/06/13  Intake/Output from previous day: 05/25 0701 - 05/26 0700 In: 3129.3 [P.O.:1080; I.V.:2049.3] Out: 3125 [Urine:3100; Stool:25] Intake/Output this shift:    PE: Abd: soft, appropriately tender, incision c/d/iw with staples, ostomy with output, can't see stoma due to opaque bag  Lab Results:   Recent Labs  10/05/13 0505 10/07/13 0553  WBC 16.7* 9.7  HGB 8.8* 8.6*  HCT 27.0* 26.1*  PLT 335 312   BMET  Recent Labs  10/05/13 0505 10/07/13 0553  NA 134* 141  K 4.5 4.0  CL 97 103  CO2 25 27  GLUCOSE 101* 103*  BUN 14 9  CREATININE 0.72 0.75  CALCIUM 8.8 9.0   PT/INR No results found for this basename: LABPROT, INR,  in the last 72 hours CMP     Component Value Date/Time   NA 141 10/07/2013 0553   NA 143 03/10/2013 1300   K 4.0 10/07/2013 0553   CL 103 10/07/2013 0553   CO2 27 10/07/2013 0553   GLUCOSE 103* 10/07/2013 0553   GLUCOSE 105* 03/10/2013 1300   BUN 9 10/07/2013 0553   BUN 17 03/10/2013 1300   CREATININE 0.75 10/07/2013 0553   CREATININE 2.16* 04/01/2012 1740   CALCIUM 9.0 10/07/2013 0553   PROT 7.4 10/01/2013 0540   PROT 7.5 09/13/2012 1449   ALBUMIN 3.2* 10/01/2013 0540   AST 16 10/01/2013 0540   ALT 17 10/01/2013 0540   ALKPHOS 190* 10/01/2013 0540   BILITOT 0.2* 10/01/2013 0540   GFRNONAA 85* 10/07/2013 0553   GFRAA >90 10/07/2013 0553   Lipase      Component Value Date/Time   LIPASE 55 09/30/2013 1705       Studies/Results: Dg Abd 1 View  10/06/2013   CLINICAL DATA:  post op nausea  EXAM: ABDOMEN - 1 VIEW  COMPARISON:  CT 09/30/2013  FINDINGS: Midline skin staples. Residual contrast in the decompressed colon. Small bowel is nondistended. Regional bones unremarkable. Left pelvic phleboliths.  IMPRESSION: Normal bowel gas pattern with residual colonic contrast.   Electronically Signed   By: Arne Cleveland M.D.   On: 10/06/2013 09:33    Anti-infectives: Anti-infectives   Start     Dose/Rate Route Frequency Ordered Stop   09/30/13 2200  ertapenem (INVANZ) 1 g in sodium chloride 0.9 % 50 mL IVPB     1 g 100 mL/hr over 30 Minutes Intravenous Every 24 hours 09/30/13 2147     09/30/13 1745  ertapenem (INVANZ) 1 g in sodium chloride 0.9 % 50 mL IVPB     1 g 100 mL/hr over 30 Minutes Intravenous  Once 09/30/13 1732 09/30/13 2045       Assessment/Plan  1. POD 5, s/p Hartman's for perforated diverticulitis  Plan: 1. Advance diet to soft diet today 2. Arrange HH for PT/RN for routine  ostomy care. 3. Invanz  D7 total but D5 post op.  Will do 7 days total post op  LOS: 7 days    Henreitta Cea 10/07/2013, 7:53 AM Pager: [phone]

## 2013-10-08 LAB — GLUCOSE, CAPILLARY
Glucose-Capillary: 105 mg/dL — ABNORMAL HIGH (ref 70–99)
Glucose-Capillary: 94 mg/dL (ref 70–99)
Glucose-Capillary: 96 mg/dL (ref 70–99)

## 2013-10-08 MED ORDER — OXYCODONE HCL 5 MG PO TABS: 2.5000 mg | ORAL_TABLET | ORAL | Status: DC | PRN

## 2013-10-08 MED ORDER — ONDANSETRON 4 MG PO TBDP: 4.0000 mg | ORAL_TABLET | Freq: Three times a day (TID) | ORAL | Status: DC | PRN

## 2013-10-08 MED ORDER — ACETAMINOPHEN 325 MG PO TABS: 325.0000 mg | ORAL_TABLET | Freq: Four times a day (QID) | ORAL | Status: DC | PRN

## 2013-10-08 MED ORDER — MIDODRINE HCL 2.5 MG PO TABS: ORAL_TABLET | ORAL | Status: DC

## 2013-10-08 NOTE — Consult Note (Addendum)
Stoma type/location: Colostomy to left lower quad from surgery on 5/21 Stomal assessment/size: Stoma red and viable, slightly above skin level, 1 1/4 inches. Peristomal assessment: Intact skin surrounding.   Output  Scant amt pink drainage, mod amt semi-formed stool. Ostomy pouching: 1pc Education provided: Pt was able to apply using barrier ring and one piece pouch with minimal assistance.  She was able to open and close spout to empty with velcro. No blistering or erythremia was noted underneath barrier of tape-free ostomy pouch. These are not available in the [place] and she was given 1 box of 7 samples to keep for use with barrier rings.  Pt denies further questions after discussing pouching routines and ordering supplies.   Placed on [place] discharge program. Plans to discharge home today. Julien Girt MSN, RN, [place], [place], [place]

## 2013-10-08 NOTE — Plan of Care (Signed)
Problem: Discharge Progression Outcomes Goal: Staples/sutures removed Outcome: Not Met (add Reason) Staples will be removed outpatient on 10/13/2013

## 2013-10-08 NOTE — Discharge Instructions (Signed)
[facility] [phone]  OPEN ABDOMINAL SURGERY: POST OP INSTRUCTIONS  Always review your discharge instruction sheet given to you by the facility where your surgery was performed.  IF YOU HAVE DISABILITY OR FAMILY LEAVE FORMS, YOU MUST BRING THEM TO THE OFFICE FOR PROCESSING.  PLEASE DO NOT GIVE THEM TO YOUR DOCTOR.  1. A prescription for pain medication may be given to you upon discharge.  Take your pain medication as prescribed, if needed.  If narcotic pain medicine is not needed, then you may take acetaminophen (Tylenol) or ibuprofen (Advil) as needed. 2. Take your usually prescribed medications unless otherwise directed. 3. If you need a refill on your pain medication, please contact your pharmacy. They will contact our office to request authorization.  Prescriptions will not be filled after 5pm or on week-ends. 4. You should follow a light diet the first few days after arrival home, such as soup and crackers, pudding, etc.unless your doctor has advised otherwise. A high-fiber, low fat diet can be resumed as tolerated.   Be sure to include lots of fluids daily. Most patients will experience some swelling and bruising on the chest and neck area.  Ice packs will help.  Swelling and bruising can take several days to resolve 5. Most patients will experience some swelling and bruising in the area of the incision. Ice pack will help. Swelling and bruising can take several days to resolve..  6. It is common to experience some constipation if taking pain medication after surgery.  Increasing fluid intake and taking a stool softener will usually help or prevent this problem from occurring.  A mild laxative (Milk of Magnesia or Miralax) should be taken according to package directions if there are no bowel movements after 48 hours. 7.  You may have steri-strips (small skin tapes) in place directly over the incision.  These strips should be left on the skin for 7-10 days.  If your  surgeon used skin glue on the incision, you may shower in 24 hours.  The glue will flake off over the next 2-3 weeks.  Any sutures or staples will be removed at the office during your follow-up visit. You may find that a light gauze bandage over your incision may keep your staples from being rubbed or pulled. You may shower and replace the bandage daily. 8. ACTIVITIES:  You may resume regular (light) daily activities beginning the next day--such as daily self-care, walking, climbing stairs--gradually increasing activities as tolerated.  You may have sexual intercourse when it is comfortable.  Refrain from any heavy lifting or straining until approved by your doctor. a. You may drive when you no longer are taking prescription pain medication, you can comfortably wear a seatbelt, and you can safely maneuver your car and apply brakes b. Return to Work: ___________________________________ 9. You should see your doctor in the office for a follow-up appointment approximately two weeks after your surgery.  Make sure that you call for this appointment within a day or two after you arrive home to insure a convenient appointment time. OTHER INSTRUCTIONS:  _____________________________________________________________ _____________________________________________________________  WHEN TO CALL YOUR DOCTOR: 1. Fever over 101.0 2. Inability to urinate 3. Nausea and/or vomiting 4. Extreme swelling or bruising 5. Continued bleeding from incision. 6. Increased pain, redness, or drainage from the incision. 7. Difficulty swallowing or breathing 8. Muscle cramping or spasms. 9. Numbness or tingling in hands or feet or around lips.  The clinic staff is available to   answer your questions during regular business hours.  Please don't hesitate to call and ask to speak to one of the nurses if you have concerns.  For further questions, please visit [email or web address]   

## 2013-10-08 NOTE — Telephone Encounter (Signed)
Form faxed

## 2013-10-08 NOTE — Progress Notes (Signed)
[facility] discharge instructions were given and went over with patient and her husband. Patient was given perscriptions for oxycodone and zofran to take to her pharmacy. Patient stated that she did not have any questions. Staff assist patient to her transportaion.

## 2013-10-08 NOTE — Progress Notes (Signed)
6 Days Post-Op  Subjective: She wants to go home.  Says she is tolerating PO's, taking Zofran BID for nausea and want some for home use.  I will stop Invanz.  Objective: Vital signs in last 24 hours: Temp:  [98.1 F (36.7 C)-98.6 F (37 C)] 98.2 F (36.8 C) (05/27 0555) Pulse Rate:  [83-92] 86 (05/27 0555) Resp:  [17-20] 17 (05/27 0555) BP: (112-131)/(51-60) 126/54 mmHg (05/27 0555) SpO2:  [95 %-97 %] 96 % (05/27 0555) Weight:  [73.074 kg (161 lb 1.6 oz)] 73.074 kg (161 lb 1.6 oz) (05/27 0555) Last BM Date: 10/07/13 840 Po recorded, + BM recorded Diet: Soft Afebrile, VSS WBC normal 5/25, and anemia stable  Intake/Output from previous day: 05/26 0701 - 05/27 0700 In: 950 [P.O.:840; I.V.:60; IV Piggyback:50] Out: 2200 [Urine:2200] Intake/Output this shift:    General appearance: alert, cooperative and no distress Resp: clear to auscultation bilaterally GI: soft, sore, incision looks fine. She says she has stool in ostomy.   Lab Results:   Recent Labs  10/07/13 0553  WBC 9.7  HGB 8.6*  HCT 26.1*  PLT 312    BMET  Recent Labs  10/07/13 0553  NA 141  K 4.0  CL 103  CO2 27  GLUCOSE 103*  BUN 9  CREATININE 0.75  CALCIUM 9.0   PT/INR No results found for this basename: LABPROT, INR,  in the last 72 hours  No results found for this basename: AST, ALT, ALKPHOS, BILITOT, PROT, ALBUMIN,  in the last 168 hours   Lipase     Component Value Date/Time   LIPASE 55 09/30/2013 1705     Studies/Results: Dg Abd 1 View  10/06/2013   CLINICAL DATA:  post op nausea  EXAM: ABDOMEN - 1 VIEW  COMPARISON:  CT 09/30/2013  FINDINGS: Midline skin staples. Residual contrast in the decompressed colon. Small bowel is nondistended. Regional bones unremarkable. Left pelvic phleboliths.  IMPRESSION: Normal bowel gas pattern with residual colonic contrast.   Electronically Signed   By: Arne Cleveland M.D.   On: 10/06/2013 09:33    Medications: . carvedilol  6.25 mg Oral BID WC  .  dexlansoprazole  60 mg Oral Daily  . ertapenem  1 g Intravenous Q24H  . heparin subcutaneous  5,000 Units Subcutaneous 3 times per day  . polyethylene glycol  17 g Oral Once  . spironolactone  25 mg Oral Daily   . lactated ringers 10 mL/hr at 10/07/13 0810   Prior to Admission medications   Medication Sig Start Date End Date Taking? Authorizing Provider  ALPRAZolam Duanne Moron) 0.25 MG tablet TAKE 1 TABLET TWICE A DAY 10/09/12  Yes [name], MD  CALCIUM-VITAMIN D PO Take 1 capsule by mouth daily.   Yes Historical Provider, MD  carvedilol (COREG) 12.5 MG tablet Take 6.25 mg by mouth 2 (two) times daily with a meal.   Yes Historical Provider, MD  Coenzyme Q10 (COQ-10) 150 MG CAPS Take 1 tablet by mouth daily. 02/21/12  Yes [name], MD  dexlansoprazole (DEXILANT) 60 MG capsule Take 1 capsule (60 mg total) by mouth daily. 08/20/13  Yes [name], MD  diclofenac sodium (VOLTAREN) 1 % GEL Apply 1 application topically 3 (three) times daily. 12/03/12  Yes [name], MD  meropenem 1 g in sodium chloride 0.9 % 100 mL Inject 1 g into the vein every 12 (twelve) hours. Patient to take 10-14 day regimen. Pt's on day 6 of therapy.   Yes Historical Provider, MD  midodrine (  PROAMATINE) 2.5 MG tablet Take 1 tablet (2.5 mg total) by mouth 3 (three) times daily as needed. 01/31/13  Yes Minna Merritts, MD  ondansetron (ZOFRAN-ODT) 4 MG disintegrating tablet Take 8 mg by mouth every 8 (eight) hours as needed for nausea or vomiting.   Yes Historical Provider, MD  oxyCODONE-acetaminophen (PERCOCET/ROXICET) 5-325 MG per tablet Take 1 tablet by mouth every 4 (four) hours as needed for severe pain.   Yes Historical Provider, MD  spironolactone (ALDACTONE) 25 MG tablet TAKE 1/2 TABLET (12.5MG  TOTAL) BY MOUTH DAILY. 01/03/13  Yes Minna Merritts, MD  vitamin C (ASCORBIC ACID) 500 MG tablet Take 500 mg by mouth 2 (two) times daily.    Yes Historical Provider, MD  vitamin E 400 UNIT capsule Take 400  Units by mouth daily.   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 Tsuei, 10/02/2013  History of cardiomyopathy status post ICD placement/  EF 25-30% now up to 50% Chronic kidney disease, stage 3-4 Normocytic Anemia/Thrombocytosis Depression/Anxiety Hypertension Hx of Migraines Hx of IBS/GERD Sleep Apnea Multiple allergies    Plan:  Discussed with Dr. Hulen Skains, and we will discontinue the PICC, Stop antibiotics.  Set up for staple removal next week and Dr. Georgette Dover in 2 weeks.  I have ask her to contact PCP to follow up and check on restarting mododrine, she has not used it here.  Discharge later today.    LOS: 8 days    Earnstine Regal 10/08/2013

## 2013-10-10 ENCOUNTER — Ambulatory Visit (INDEPENDENT_AMBULATORY_CARE_PROVIDER_SITE_OTHER): Payer: Medicare Other | Admitting: Family Medicine

## 2013-10-10 ENCOUNTER — Ambulatory Visit (INDEPENDENT_AMBULATORY_CARE_PROVIDER_SITE_OTHER): Payer: Federal, State, Local not specified - PPO | Admitting: Surgery

## 2013-10-10 ENCOUNTER — Encounter (HOSPITAL_COMMUNITY): Payer: Self-pay | Admitting: General Surgery

## 2013-10-10 ENCOUNTER — Encounter: Payer: Self-pay | Admitting: Family Medicine

## 2013-10-10 VITALS — BP 122/64 | HR 87 | Temp 98.0°F | Wt 154.2 lb

## 2013-10-10 DIAGNOSIS — F3289 Other specified depressive episodes: Secondary | ICD-10-CM

## 2013-10-10 DIAGNOSIS — K630 Abscess of intestine: Secondary | ICD-10-CM | POA: Diagnosis not present

## 2013-10-10 DIAGNOSIS — N183 Chronic kidney disease, stage 3 unspecified: Secondary | ICD-10-CM

## 2013-10-10 DIAGNOSIS — F410 Panic disorder [episodic paroxysmal anxiety] without agoraphobia: Secondary | ICD-10-CM | POA: Diagnosis not present

## 2013-10-10 DIAGNOSIS — K5732 Diverticulitis of large intestine without perforation or abscess without bleeding: Secondary | ICD-10-CM | POA: Diagnosis not present

## 2013-10-10 DIAGNOSIS — I5022 Chronic systolic (congestive) heart failure: Secondary | ICD-10-CM | POA: Diagnosis not present

## 2013-10-10 DIAGNOSIS — Z9189 Other specified personal risk factors, not elsewhere classified: Secondary | ICD-10-CM

## 2013-10-10 DIAGNOSIS — F32A Depression, unspecified: Secondary | ICD-10-CM

## 2013-10-10 DIAGNOSIS — D649 Anemia, unspecified: Secondary | ICD-10-CM

## 2013-10-10 DIAGNOSIS — I951 Orthostatic hypotension: Secondary | ICD-10-CM | POA: Diagnosis not present

## 2013-10-10 DIAGNOSIS — F329 Major depressive disorder, single episode, unspecified: Secondary | ICD-10-CM

## 2013-10-10 DIAGNOSIS — K5780 Diverticulitis of intestine, part unspecified, with perforation and abscess without bleeding: Secondary | ICD-10-CM

## 2013-10-10 DIAGNOSIS — Z889 Allergy status to unspecified drugs, medicaments and biological substances status: Secondary | ICD-10-CM

## 2013-10-10 DIAGNOSIS — I509 Heart failure, unspecified: Secondary | ICD-10-CM

## 2013-10-10 HISTORY — DX: Allergy status to unspecified drugs, medicaments and biological substances: Z88.9

## 2013-10-10 HISTORY — DX: Other specified personal risk factors, not elsewhere classified: Z91.89

## 2013-10-10 NOTE — Patient Instructions (Addendum)
No changes today. Keep appointment with Dr. [name] and Dr. Gershon Crane. No need for midodrine currently. Good to see you today. Return in 2 months for follow up. Return in 2 weeks for rpt CBC.

## 2013-10-10 NOTE — Progress Notes (Signed)
Pre visit review using our clinic review tool, if applicable. No additional management support is needed unless otherwise documented below in the visit note. 

## 2013-10-10 NOTE — Discharge Summary (Signed)
Physician Discharge Summary  Patient ID: Ashley [name] MRN: [id number] DOB/AGE: Jul 07, 1945 68 y.o.  Admit date: 09/30/2013 Discharge date: 10/08/2013  Admission Diagnoses:  Perforated diverticulitis; s/p drain and laparoscopic wash out, now with 7.5 cm pelvic  abscess and fistula History of cardiomyopathy status post ICD placement/ EF 25-30% now up to 50%  Chronic kidney disease, stage 3-4  Normocytic Anemia/Thrombocytosis  Depression/Anxiety  Hypertension  Hx of Migraines  Hx of IBS/GERD  Sleep Apnea  Multiple allergies  Discharge Diagnoses: SAME   Principal Problem:   sigmoid diverticulitis with perforation, abscess and fistula Active Problems:   Chronic systolic CHF (congestive heart failure)   CKD (chronic kidney disease) stage 3, GFR 30-59 ml/min   Anemia   H/O multiple allergies   PROCEDURES:  S/p Sigmoid colectomy with descending colostomy (Hartman's procedure), Surgeon:Matthew K. Tsuei, 10/02/2013   Hospital Course:  patient was vacationing in [place], [place], when she developed severe abdominal pain. She was diagnosed at a local hospital with perforated diverticulitis. She was transferred to [facility]. She was hospitalized there for 13 days. She underwent laparoscopic irrigation and drain placement. She was treated with IV antibiotics. She did not initially improve and followup CT showed abscess. This was percutaneously drained. These drains were removed and she was discharged home 4 days ago. She had some lab work done in Dr. Bosie Clos office today for followup. She still had a elevated white blood cell counts she was sent for CAT scan. 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 She has mild abdominal pain. She had multiple loose bowel movements after the CAT scan today. We are asked to see her in consultation. Dr. Danise Mina also spoke with my partner, Dr. Zella Richer earlier today.  Pt was admitted by Medicine.  IR saw the patient  and was unable to drain this abscess.  She had a leak of contrast from her colon into the abscess.  After cardiac clearance she was taken to the OR by Dr. Georgette Dover and underwent the above noted procedure. Post op she had some issues with pain control.  She also had some issues with tape and reported tape allergies.  She also had some anxiety and depression issues.  Her diet was slowly advanced and she had ostomy function.  She was ready to go home on 5/27.  Antibiotics were discontinued and her PICC line was removed. She was tolerating a soft diet.  Home health was set up to help with her ostomy care.  Condition on d/c:  Improved            Disposition: 01-Home or Self Care     Medication List    STOP taking these medications       meropenem 1 g in sodium chloride 0.9 % 100 mL     oxyCODONE-acetaminophen 5-325 MG per tablet  Commonly known as:  PERCOCET/ROXICET      TAKE these medications       acetaminophen 325 MG tablet  Commonly known as:  TYLENOL  Take 1-2 tablets (325-650 mg total) by mouth every 6 (six) hours as needed for mild pain (or Fever >/= 101).     ALPRAZolam 0.25 MG tablet  Commonly known as:  XANAX  TAKE 1 TABLET TWICE A DAY     CALCIUM-VITAMIN D PO  Take 1 capsule by mouth daily.     carvedilol 12.5 MG tablet  Commonly known as:  COREG  Take 6.25 mg by mouth 2 (two) times daily with a meal.  CoQ-10 150 MG Caps  Take 1 tablet by mouth daily.     dexlansoprazole 60 MG capsule  Commonly known as:  DEXILANT  Take 1 capsule (60 mg total) by mouth daily.     diclofenac sodium 1 % Gel  Commonly known as:  VOLTAREN  Apply 1 application topically 3 (three) times daily.     midodrine 2.5 MG tablet  Commonly known as:  [place] your primary care and discuss restarting this medicine.     ondansetron 4 MG disintegrating tablet  Commonly known as:  ZOFRAN-ODT  Take 1 tablet (4 mg total) by mouth every 8 (eight) hours as needed for nausea or  vomiting.     oxyCODONE 5 MG immediate release tablet  Commonly known as:  Oxy IR/ROXICODONE  Take 0.5 tablets (2.5 mg total) by mouth every 4 (four) hours as needed for moderate pain or severe pain (takes 1/2 tab q4h prn).     spironolactone 25 MG tablet  Commonly known as:  ALDACTONE  TAKE 1/2 TABLET (12.5MG  TOTAL) BY MOUTH DAILY.     vitamin C 500 MG tablet  Commonly known as:  ASCORBIC ACID  Take 500 mg by mouth 2 (two) times daily.     vitamin E 400 UNIT capsule  Take 400 Units by mouth daily.     zolpidem 5 MG tablet  Commonly known as:  AMBIEN  TAKE 1/2 TO 1 TABLET AT BEDTIME AS NEEDED       Follow-up Information   Follow up with Maia Petties., MD On 10/13/2013. (Come to the office and check in at 9:45 for staple removal at 10 AM.)    Specialty:  General Surgery   Contact information:   [place] [address] [phone]       Follow up with Maia Petties., MD On 10/27/2013. (Be at the office for check in at 10:30 AM, for a 10:50 AM appointment.)    Specialty:  General Surgery   Contact information:   [place] [address] [phone]       Schedule an appointment as soon as possible for a visit with [name], MD. (Let him know your are out and let him decide about continuing midodrine.)    Specialty:  Family Medicine   Contact information:   [place] [address] [phone]       Schedule an appointment as soon as possible for a visit with [name], MD. (For follow up of your heart.)    Specialty:  Cardiology   Contact information:   [place] [address] [phone]       Signed: Earnstine Regal 10/10/2013, 11:01 AM

## 2013-10-10 NOTE — Progress Notes (Signed)
BP 122/64  Pulse 87  Temp(Src) 98 F (36.7 C) (Oral)  Wt 154 lb 4 oz (69.967 kg)  SpO2 96%   CC: hosp f/u  Subjective:    Patient ID: Ashley [name], female    DOB: 12-19-1945, 68 y.o.   MRN: [id number]  HPI: Ashley [name] is a 68 y.o. female presenting on 10/10/2013 for Hospitalization Follow-up   Ashley [name] presents today for hospital f/u.  Recent complicated hospitalization with perforated diverticulitis s/p colostomy. See below. Currently only on spironolactone and carvedilol 1/2 twice daily. Abd staying sore. Has f/u with surgery 6/1 and cardiology same day. No fevers/chills. Only has needed zofran ODT x1. Slowly advancing diet. Has HH RN coming out to house regularly. Records reviewed.  She had midodrine listed as a med but never really took it while in hospital or outpatient prior. Has not needed since discharge.  Has not restarted supplements/vitamins.  Rare xanax use despite significant anxiety after recent diverticulitis with complications.  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 History of cardiomyopathy status post ICD placement/ EF 25-30% now up to 50%  Chronic kidney disease, stage 3-4  Normocytic Anemia/Thrombocytosis  Depression/Anxiety  Hypertension  Hx of Migraines  Hx of IBS/GERD  Sleep Apnea  Multiple allergies  Discharge Diagnoses: SAME  Principal Problem:  sigmoid diverticulitis with perforation, abscess and fistula  Active Problems:  Chronic systolic CHF (congestive heart failure)  CKD (chronic kidney disease) stage 3, GFR 30-59 ml/min  Anemia  H/O multiple allergies  PROCEDURES: S/p Sigmoid colectomy with descending colostomy (Hartman's procedure), Surgeon:Matthew K. Tsuei, 10/02/2013  Hospital Course:  patient was vacationing in [place], [place], when she developed severe abdominal pain. She  was diagnosed at a local hospital with perforated diverticulitis. She was transferred to [facility]. She was hospitalized there for 13 days. She underwent laparoscopic irrigation and drain placement. She was treated with IV antibiotics. She did not initially improve and followup CT showed abscess. This was percutaneously drained. These drains were removed and she was discharged home 4 days ago. She had some lab work done in Dr. Bosie Clos office today for followup. She still had a elevated white blood cell counts she was sent for CAT scan. 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 She has mild abdominal pain. She had multiple loose bowel movements after the CAT scan today. We are asked to see her in consultation. Dr. Danise Mina also spoke with my partner, Dr. Zella Richer earlier today.   Pt was admitted by Medicine. IR saw the patient and was unable to drain this abscess. She had a leak of contrast from her colon into the abscess. After cardiac clearance she was taken to the OR by Dr. Georgette Dover and underwent the above noted procedure. Post op she had some issues with pain control. She also had some issues with tpae and reported tape allergies. She also had some anxiety and depression issues. Her diet was slowly advanced and she had ostomy function. She was ready to go home on 5/27. Antibiotics were discontinued and her PICC line was removed.    Relevant past medical, surgical, family and social history reviewed and updated as indicated.  Allergies and medications reviewed and updated. Current Outpatient Prescriptions on File Prior to Visit  Medication Sig  . acetaminophen (TYLENOL) 325 MG tablet Take 1-2 tablets (325-650 mg total) by mouth every 6 (six) hours as  needed for mild pain (or Fever >/= 101).  . carvedilol (COREG) 12.5 MG tablet Take 6.25 mg by mouth 2 (two) times daily with a meal.  . dexlansoprazole (DEXILANT) 60 MG capsule Take 1 capsule (60 mg total) by mouth daily.  Marland Kitchen  oxyCODONE (OXY IR/ROXICODONE) 5 MG immediate release tablet Take 0.5 tablets (2.5 mg total) by mouth every 4 (four) hours as needed for moderate pain or severe pain (takes 1/2 tab q4h prn).  Marland Kitchen spironolactone (ALDACTONE) 25 MG tablet TAKE 1/2 TABLET (12.5MG  TOTAL) BY MOUTH DAILY.  Marland Kitchen ALPRAZolam (XANAX) 0.25 MG tablet TAKE 1 TABLET TWICE A DAY  . CALCIUM-VITAMIN D PO Take 1 capsule by mouth daily.  . Coenzyme Q10 (COQ-10) 150 MG CAPS Take 1 tablet by mouth daily.  . diclofenac sodium (VOLTAREN) 1 % GEL Apply 1 application topically 3 (three) times daily.  . vitamin C (ASCORBIC ACID) 500 MG tablet Take 500 mg by mouth 2 (two) times daily.   . vitamin E 400 UNIT capsule Take 400 Units by mouth daily.  Marland Kitchen zolpidem (AMBIEN) 5 MG tablet TAKE 1/2 TO 1 TABLET AT BEDTIME AS NEEDED  . [DISCONTINUED] bisoprolol (ZEBETA) 5 MG tablet Take 5 mg by mouth daily.   No current facility-administered medications on file prior to visit.    Review of Systems Per HPI unless specifically indicated above    Objective:    BP 122/64  Pulse 87  Temp(Src) 98 F (36.7 C) (Oral)  Wt 154 lb 4 oz (69.967 kg)  SpO2 96%  Physical Exam  Nursing note and vitals reviewed. Constitutional: She appears well-developed and well-nourished. No distress.  HENT:  Mouth/Throat: Oropharynx is clear and moist. No oropharyngeal exudate.  Eyes: Conjunctivae and EOM are normal. Pupils are equal, round, and reactive to light. No scleral icterus.  Cardiovascular: Normal rate, regular rhythm, normal heart sounds and intact distal pulses.   No murmur heard. Pulmonary/Chest: Effort normal and breath sounds normal. No respiratory distress. She has no wheezes. She has no rales.  Abdominal: Soft. Bowel sounds are normal. She exhibits no distension and no mass. There is no hepatosplenomegaly. There is tenderness. There is no rebound, no guarding and no CVA tenderness.  Multiple ecchymoses on abdomen at sites of heparin injections Central  longitudinal incision edges well approximated with staples in place, no induration/erythema  Musculoskeletal: She exhibits no edema.  Skin: Skin is warm and dry. No rash noted.       Assessment & Plan:   Problem List Items Addressed This Visit   Sigmoid diverticulitis with abscess, perforation and fistula formation - Primary     S/p open abdominal surgery with colostomy.  Has f/u planned with surgery on Monday.  Now off antibiotics and PICC line out. Doing well, anticipate slow recovery.    Orthostatic hypotension     Not currently need midodrine. I have removed from med list.    Depression     More trouble with depressed mood and anxiety since recent complicated hospitalizations.  Declines antidepressant for now.    CKD (chronic kidney disease) stage 3, GFR 30-59 ml/min      Lab Results  Component Value Date   CREATININE 0.75 10/07/2013  Cr improved through hospitalization. Continue to monitor.    Chronic systolic CHF (congestive heart failure)     No signs of fluid overload today. Off torsemide.  Only on spironolactone 12.5mg  qd and coreg 6.25mg  bid, bp stable. F/u with cards as scheduled.    Anxiety attack  Advised ok to restart xanax.  States has not had good reaction to antidepressant in past so hesitant to retry (unsure which she tried in past).    Anemia      Relatively new significant anemia, anticipate acute blood loss anemia from recent surgeries.   Anticipate contributing to current malaise but does not endorse dizziness or significant dyspnea. No obvious continued source of bleed. Latest echo EF ~55% so do not think she needs blood transfusion today. Provided with dietary sources of iron handout to try and increase these foods. rec recheck CBC in 1 week to ensure trending up. Want to avoid oral ferous sulfate so soon after colostomy but if staying low will rec start iron supplement.  Lab Results  Component Value Date   HGB 8.6* 10/07/2013          Follow  up plan: Return in about 2 months (around 12/10/2013), or as needed, for follow up.

## 2013-10-11 ENCOUNTER — Telehealth: Payer: Self-pay | Admitting: Family Medicine

## 2013-10-11 DIAGNOSIS — D649 Anemia, unspecified: Secondary | ICD-10-CM

## 2013-10-11 DIAGNOSIS — N183 Chronic kidney disease, stage 3 unspecified: Secondary | ICD-10-CM

## 2013-10-11 NOTE — Assessment & Plan Note (Signed)
Not currently need midodrine. I have removed from med list.

## 2013-10-11 NOTE — Assessment & Plan Note (Signed)
Lab Results  Component Value Date   CREATININE 0.75 10/07/2013  Cr improved through hospitalization. Continue to monitor.

## 2013-10-11 NOTE — Telephone Encounter (Signed)
plz notify - I'd like her to return next week for rpt CBC (not wait 2 wks). Orders placed.

## 2013-10-11 NOTE — Assessment & Plan Note (Signed)
Advised ok to restart xanax.  States has not had good reaction to antidepressant in past so hesitant to retry (unsure which she tried in past).

## 2013-10-11 NOTE — Assessment & Plan Note (Signed)
No signs of fluid overload today. Off torsemide.  Only on spironolactone 12.5mg  qd and coreg 6.25mg  bid, bp stable. F/u with cards as scheduled.

## 2013-10-11 NOTE — Assessment & Plan Note (Signed)
S/p open abdominal surgery with colostomy.  Has f/u planned with surgery on Monday.  Now off antibiotics and PICC line out. Doing well, anticipate slow recovery.

## 2013-10-11 NOTE — Assessment & Plan Note (Addendum)
Relatively new significant anemia, anticipate acute blood loss anemia from recent surgeries.   Anticipate contributing to current malaise but does not endorse dizziness or significant dyspnea. No obvious continued source of bleed. Latest echo EF ~55% so do not think she needs blood transfusion today. Provided with dietary sources of iron handout to try and increase these foods. rec recheck CBC in 1 week to ensure trending up. Want to avoid oral ferous sulfate so soon after colostomy but if staying low will rec start iron supplement.  Lab Results  Component Value Date   HGB 8.6* 10/07/2013

## 2013-10-11 NOTE — Assessment & Plan Note (Signed)
More trouble with depressed mood and anxiety since recent complicated hospitalizations.  Declines antidepressant for now.

## 2013-10-13 ENCOUNTER — Encounter (INDEPENDENT_AMBULATORY_CARE_PROVIDER_SITE_OTHER): Payer: Federal, State, Local not specified - PPO

## 2013-10-13 ENCOUNTER — Encounter (INDEPENDENT_AMBULATORY_CARE_PROVIDER_SITE_OTHER): Payer: Self-pay | Admitting: General Surgery

## 2013-10-13 ENCOUNTER — Ambulatory Visit (INDEPENDENT_AMBULATORY_CARE_PROVIDER_SITE_OTHER): Payer: Medicare Other | Admitting: General Surgery

## 2013-10-13 ENCOUNTER — Ambulatory Visit (INDEPENDENT_AMBULATORY_CARE_PROVIDER_SITE_OTHER): Payer: Medicare Other | Admitting: Cardiovascular Disease

## 2013-10-13 ENCOUNTER — Encounter: Payer: Self-pay | Admitting: Cardiovascular Disease

## 2013-10-13 ENCOUNTER — Telehealth: Payer: Self-pay

## 2013-10-13 VITALS — BP 104/60 | HR 96 | Ht 65.5 in | Wt 152.5 lb

## 2013-10-13 DIAGNOSIS — T8140XA Infection following a procedure, unspecified, initial encounter: Secondary | ICD-10-CM

## 2013-10-13 DIAGNOSIS — I951 Orthostatic hypotension: Secondary | ICD-10-CM | POA: Diagnosis not present

## 2013-10-13 DIAGNOSIS — I420 Dilated cardiomyopathy: Secondary | ICD-10-CM

## 2013-10-13 DIAGNOSIS — R0602 Shortness of breath: Secondary | ICD-10-CM

## 2013-10-13 DIAGNOSIS — N183 Chronic kidney disease, stage 3 unspecified: Secondary | ICD-10-CM | POA: Diagnosis not present

## 2013-10-13 DIAGNOSIS — K630 Abscess of intestine: Secondary | ICD-10-CM

## 2013-10-13 DIAGNOSIS — K5780 Diverticulitis of intestine, part unspecified, with perforation and abscess without bleeding: Secondary | ICD-10-CM

## 2013-10-13 DIAGNOSIS — I428 Other cardiomyopathies: Secondary | ICD-10-CM | POA: Diagnosis not present

## 2013-10-13 DIAGNOSIS — K5732 Diverticulitis of large intestine without perforation or abscess without bleeding: Secondary | ICD-10-CM

## 2013-10-13 DIAGNOSIS — T8149XA Infection following a procedure, other surgical site, initial encounter: Secondary | ICD-10-CM

## 2013-10-13 MED ORDER — LEVOFLOXACIN 250 MG PO TABS: 250.0000 mg | ORAL_TABLET | Freq: Every day | ORAL | Status: DC

## 2013-10-13 MED ORDER — LEVOFLOXACIN 250 MG PO TABS: 500.0000 mg | ORAL_TABLET | Freq: Every day | ORAL | Status: AC

## 2013-10-13 NOTE — Assessment & Plan Note (Signed)
She has denied any significant orthostatic type symptoms. Surprisingly her blood pressure is relatively stable. We'll continue low-dose carvedilol for now. Midodrine on hold

## 2013-10-13 NOTE — Progress Notes (Signed)
Subjective:     Patient ID: Ashley [name], female   DOB: [birth year], 68 y.o.   MRN: [id number]  HPI This is a 68 year old female who recently underwent a sigmoid colectomy with a colostomy by one of my partners. She was discharged home last Wednesday. Since then she has a lot of issues with pain. She does have bowel function and is passing flatus via her stoma. She is eating. She has not had a fever at home. She does describe that she began today having some drainage from her incision. This is also had some redness associated with it. She comes in having this evaluated.  Review of Systems     Objective:   Physical Exam Left lower quadrant colostomy, functional There is surrounding erythema at her incision as well as some drainage from the superior aspect consistent with a wound infection    Assessment:     Postoperative wound infection status post colectomy     Plan:     I removed her staples today. I then opened her wound. There was a large amount of pus that was under pressure. I then packed this open. She's going to need to do wet to dry dressings twice daily. Her fascia is intact currently. On going to plan on seeing her back later this week as Dr. Georgette Dover is out of town.

## 2013-10-13 NOTE — Telephone Encounter (Signed)
Pt called back and wanted Dr. [name] to know she went to her surgeon to get her staples out and there was an infection in several places, they removed all 22 staples, and put her on Levoquin and asked her to change the bandages twice a day, a [place] is coming to educate her on how to do this.

## 2013-10-13 NOTE — Progress Notes (Signed)
Patient ID: Ashley [name], female    DOB: 1945/11/16, 68 y.o.   MRN: [id number]  HPI Comments: 68 y/o female with history of fibromyalgia, irritable bowel, pneumonia, chronic cough felt secondary to GERD, abnormal PFTs in the past, shortness of breath dating back to September 2012 that presented acutely, CT scan showing progressive pleural effusion , echo showing Cardiomyopathy  ejection fraction estimated at 25-35%,  Previous  Lexiscan Myoview  showed no significant ischemia, no EKG changes concerning for ischemia.   admission to [facility] in December 2012 for bronchitis and hypoxia as well as systolic CHF. She had antibiotics, prednisone and diuresis and has felt better since discharge. ACE inhibitor placed on hold secondary to chronic cough.  S/P ICD 10 days ago for EF <35%.  Goal weight had been in the low 150 range. Previous Cardiac MRI showed ejection fraction 34 %    ICD placed  by Dr. Lovena Le.  Previous symptoms of malaise and fatigue, tachycardia, weight gain, leg swelling . previously taking high doses of diuretics for weight gain causing renal dysfunction, dehydration. For creatinine 1.9 we suggested she cut back on her diuretics. Prior weight was in the low 160 pound range  In followup today, we learned about her diverticulosis, abscess formation, perforation requiring a long course of antibiotics with PICC line, She was seen in [place] initially  subsequent CT scan Sep 30 2013 showing free air, abscess requiring partial colectomy. She has an ostomy. Today she has lost 22 pounds by her report.  She does report having increasing drainage from her wound site over the past day or so. She scheduled to have her staples removed today. She has significant pain around the area, no significant improvement over the past 2 weeks. She still taking pain medication. She's currently not taking diuretics. She has not been drinking much fluids secondary to abdominal  discomfort.  Previous echocardiogram shows improvement in her ejection fraction up to 50%. Challenging image quality. On my own review, it does appear at least 45, if not 50%.   EKG shows normal sinus rhythm with rate 96 beats per minute with nonspecific ST abnormality, PVC noted      Outpatient Encounter Prescriptions as of 10/13/2013  Medication Sig  . acetaminophen (TYLENOL) 325 MG tablet Take 1-2 tablets (325-650 mg total) by mouth every 6 (six) hours as needed for mild pain (or Fever >/= 101).  Marland Kitchen ALPRAZolam (XANAX) 0.25 MG tablet TAKE 1 TABLET TWICE A DAY  . carvedilol (COREG) 12.5 MG tablet Take 6.25 mg by mouth 2 (two) times daily with a meal.  . dexlansoprazole (DEXILANT) 60 MG capsule Take 1 capsule (60 mg total) by mouth daily.  . diclofenac sodium (VOLTAREN) 1 % GEL Apply 1 application topically 3 (three) times daily.  Marland Kitchen oxyCODONE (OXY IR/ROXICODONE) 5 MG immediate release tablet Take 0.5 tablets (2.5 mg total) by mouth every 4 (four) hours as needed for moderate pain or severe pain (takes 1/2 tab q4h prn).  Marland Kitchen spironolactone (ALDACTONE) 25 MG tablet TAKE 25 mg TABLET BY MOUTH DAILY.  Marland Kitchen zolpidem (AMBIEN) 5 MG tablet TAKE 1/2 TO 1 TABLET AT BEDTIME AS NEEDED   Review of Systems  HENT: Negative.   Eyes: Negative.   Respiratory: Negative.   Cardiovascular: Negative.   Gastrointestinal: Positive for abdominal pain.  Endocrine: Negative.   Musculoskeletal: Positive for arthralgias, back pain, myalgias and neck stiffness.  Skin: Negative.   Allergic/Immunologic: Negative.   Neurological: Negative.   Hematological: Negative.  Psychiatric/Behavioral: Negative.   All other systems reviewed and are negative.   BP 104/60  Pulse 96  Ht 5' 5.5" (1.664 m)  Wt 152 lb 8 oz (69.174 kg)  BMI 24.98 kg/m2  Physical Exam  Nursing note and vitals reviewed. Constitutional: She is oriented to person, place, and time. She appears well-developed and well-nourished.  HENT:  Head:  Normocephalic.  Nose: Nose normal.  Mouth/Throat: Oropharynx is clear and moist.  Eyes: Conjunctivae are normal. Pupils are equal, round, and reactive to light.  Neck: Normal range of motion. Neck supple. No JVD present.  Cardiovascular: Normal rate, regular rhythm, S1 normal, S2 normal, normal heart sounds and intact distal pulses.  Exam reveals no gallop and no friction rub.   No murmur heard. Pulmonary/Chest: Effort normal and breath sounds normal. No respiratory distress. She has no wheezes. She has no rales. She exhibits no tenderness.  Abdominal: Soft. Bowel sounds are normal. She exhibits no distension. There is no tenderness.  Musculoskeletal: Normal range of motion. She exhibits no edema and no tenderness.  Lymphadenopathy:    She has no cervical adenopathy.  Neurological: She is alert and oriented to person, place, and time. Coordination normal.  Skin: Skin is warm and dry. No rash noted. No erythema.  Psychiatric: She has a normal mood and affect. Her behavior is normal. Judgment and thought content normal.    Assessment and Plan

## 2013-10-13 NOTE — Assessment & Plan Note (Signed)
She is concerned about her renal function.  GFR and creatinine normal

## 2013-10-13 NOTE — Patient Instructions (Signed)
You are doing well. No medication changes were made.  Please take torsemide as needed  Please call us if you have new issues that need to be addressed before your next appt.  Your physician wants you to follow-up in: 3 months.  You will receive a reminder letter in the mail two months in advance. If you don't receive a letter, please call our office to schedule the follow-up appointment.

## 2013-10-13 NOTE — Assessment & Plan Note (Signed)
I'm concerned about recent drainage from her incision site. She scheduled to have staples removed today. Recommend that she discuss this with the surgeon. Perhaps white blood cell count could be checked. May need repeat CT scan if symptoms persist to exclude recurrent abscess or fistula

## 2013-10-13 NOTE — Assessment & Plan Note (Signed)
Currently not taking any diuretics. Weight has been relatively stable. We have suggested she take diuretic as needed for weight gain, leg edema, shortness of breath. Prior weight will be unreliable given recent weight loss following her GI issues

## 2013-10-14 ENCOUNTER — Telehealth (INDEPENDENT_AMBULATORY_CARE_PROVIDER_SITE_OTHER): Payer: Self-pay

## 2013-10-14 NOTE — Telephone Encounter (Signed)
Patient notified and lab appt scheduled.  

## 2013-10-14 NOTE — Telephone Encounter (Signed)
[facility] Nurse calling to get wound care orders. Informed RN per Dr Cristal Generous orders pt is to have wet-dry dressing changes twice daily. Vaughan Basta verbalized understanding.

## 2013-10-14 NOTE — Telephone Encounter (Signed)
I though there might have been an infection the way she descibed it to me. Glad they are managing it

## 2013-10-16 ENCOUNTER — Encounter (INDEPENDENT_AMBULATORY_CARE_PROVIDER_SITE_OTHER): Payer: Self-pay | Admitting: General Surgery

## 2013-10-16 ENCOUNTER — Ambulatory Visit (INDEPENDENT_AMBULATORY_CARE_PROVIDER_SITE_OTHER): Payer: Medicare Other | Admitting: General Surgery

## 2013-10-16 VITALS — BP 128/80 | HR 93 | Temp 97.1°F | Ht 65.0 in | Wt 150.0 lb

## 2013-10-16 DIAGNOSIS — Z09 Encounter for follow-up examination after completed treatment for conditions other than malignant neoplasm: Secondary | ICD-10-CM

## 2013-10-17 ENCOUNTER — Telehealth: Payer: Self-pay

## 2013-10-17 ENCOUNTER — Other Ambulatory Visit (INDEPENDENT_AMBULATORY_CARE_PROVIDER_SITE_OTHER): Payer: Medicare Other

## 2013-10-17 DIAGNOSIS — N183 Chronic kidney disease, stage 3 unspecified: Secondary | ICD-10-CM | POA: Diagnosis not present

## 2013-10-17 DIAGNOSIS — D649 Anemia, unspecified: Secondary | ICD-10-CM | POA: Diagnosis not present

## 2013-10-17 LAB — BASIC METABOLIC PANEL
BUN: 14 mg/dL (ref 6–23)
CO2: 26 mEq/L (ref 19–32)
Calcium: 9.9 mg/dL (ref 8.4–10.5)
Chloride: 102 mEq/L (ref 96–112)
Creatinine, Ser: 1 mg/dL (ref 0.4–1.2)
GFR: 56.6 mL/min — ABNORMAL LOW (ref >60.00)
Glucose, Bld: 97 mg/dL (ref 70–99)
Potassium: 4.5 mEq/L (ref 3.5–5.1)
Sodium: 138 mEq/L (ref 135–145)

## 2013-10-17 LAB — CBC WITH DIFFERENTIAL/PLATELET
Basophils Absolute: 0 10*3/uL (ref 0.0–0.1)
Basophils Relative: 0.1 % (ref 0.0–3.0)
Eosinophils Absolute: 0.2 10*3/uL (ref 0.0–0.7)
Eosinophils Relative: 1.1 % (ref 0.0–5.0)
HCT: 32.2 % — ABNORMAL LOW (ref 36.0–46.0)
Hemoglobin: 10.6 g/dL — ABNORMAL LOW (ref 12.0–15.0)
Lymphocytes Relative: 15.5 % (ref 12.0–46.0)
Lymphs Abs: 2.5 10*3/uL (ref 0.7–4.0)
MCHC: 32.8 g/dL (ref 30.0–36.0)
MCV: 88.8 fl (ref 78.0–100.0)
Monocytes Absolute: 1.2 10*3/uL — ABNORMAL HIGH (ref 0.1–1.0)
Monocytes Relative: 7.5 % (ref 3.0–12.0)
Neutro Abs: 12 10*3/uL — ABNORMAL HIGH (ref 1.4–7.7)
Neutrophils Relative %: 75.8 % (ref 43.0–77.0)
Platelets: 430 10*3/uL — ABNORMAL HIGH (ref 150.0–400.0)
RBC: 3.63 Mil/uL — ABNORMAL LOW (ref 3.87–5.11)
RDW: 14.3 % (ref 11.5–15.5)
WBC: 15.8 10*3/uL — ABNORMAL HIGH (ref 4.0–10.5)

## 2013-10-17 LAB — FERRITIN: Ferritin: 73.7 ng/mL (ref 10.0–291.0)

## 2013-10-17 LAB — IBC PANEL
Iron: 39 ug/dL — ABNORMAL LOW (ref 42–145)
Saturation Ratios: 9.2 % — ABNORMAL LOW (ref 20.0–50.0)
Transferrin: 304.2 mg/dL (ref 212.0–360.0)

## 2013-10-17 NOTE — Telephone Encounter (Signed)
Ok to give this verbal order

## 2013-10-17 NOTE — Telephone Encounter (Signed)
Message left notifying Laura.  

## 2013-10-17 NOTE — Telephone Encounter (Signed)
Mickel Baas PT with [facility] left v/m requesting verbal order for home health  PT for 1 x a week for 1 week; 2 x a week for 1 week; 3 x a week for 2 weeks and 2 x a week for 1 week.Please advise.

## 2013-10-21 NOTE — Progress Notes (Signed)
Subjective:     Patient ID: Ashley [name], female   DOB: 1946/04/02, 68 y.o.   MRN: [id number]  HPI This is a 68 year old female who recently underwent a sigmoid colectomy with a colostomy by one of my partners. I opened her wound last visit with large amount of purulence.  This has been undergoing packing at home now.  She feels much better. Stoma still working well.  No complaints today   Review of Systems     Objective:   Physical Exam Wound open with some exudate at base, granulation tissue present, no purulence    Assessment:     Wound infection     Plan:     I think this will continue to improve with dressing changes. She is going to continue everything else as is. I dont think she needs imaging at this point. I will have her return to see Dr. Georgette Dover next week again. She knows to call sooner if she develops any other symptoms.

## 2013-10-22 ENCOUNTER — Other Ambulatory Visit: Payer: Self-pay

## 2013-10-23 ENCOUNTER — Telehealth (INDEPENDENT_AMBULATORY_CARE_PROVIDER_SITE_OTHER): Payer: Self-pay

## 2013-10-23 ENCOUNTER — Ambulatory Visit (INDEPENDENT_AMBULATORY_CARE_PROVIDER_SITE_OTHER): Payer: Medicare Other | Admitting: Internal Medicine

## 2013-10-23 ENCOUNTER — Other Ambulatory Visit: Payer: Self-pay

## 2013-10-23 ENCOUNTER — Ambulatory Visit (INDEPENDENT_AMBULATORY_CARE_PROVIDER_SITE_OTHER): Payer: Medicare Other | Admitting: Surgery

## 2013-10-23 ENCOUNTER — Encounter (INDEPENDENT_AMBULATORY_CARE_PROVIDER_SITE_OTHER): Payer: Self-pay | Admitting: Surgery

## 2013-10-23 ENCOUNTER — Encounter: Payer: Self-pay | Admitting: Internal Medicine

## 2013-10-23 ENCOUNTER — Telehealth: Payer: Self-pay | Admitting: Family Medicine

## 2013-10-23 VITALS — BP 95/55 | HR 90 | Temp 98.2°F | Resp 12 | Ht 65.5 in | Wt 147.8 lb

## 2013-10-23 VITALS — BP 114/58 | HR 95 | Ht 65.5 in | Wt 149.0 lb

## 2013-10-23 DIAGNOSIS — K630 Abscess of intestine: Secondary | ICD-10-CM

## 2013-10-23 DIAGNOSIS — I509 Heart failure, unspecified: Secondary | ICD-10-CM

## 2013-10-23 DIAGNOSIS — Z95810 Presence of automatic (implantable) cardiac defibrillator: Secondary | ICD-10-CM

## 2013-10-23 DIAGNOSIS — T8140XA Infection following a procedure, unspecified, initial encounter: Secondary | ICD-10-CM

## 2013-10-23 DIAGNOSIS — K5732 Diverticulitis of large intestine without perforation or abscess without bleeding: Secondary | ICD-10-CM

## 2013-10-23 DIAGNOSIS — T8149XA Infection following a procedure, other surgical site, initial encounter: Secondary | ICD-10-CM

## 2013-10-23 DIAGNOSIS — I5022 Chronic systolic (congestive) heart failure: Secondary | ICD-10-CM

## 2013-10-23 DIAGNOSIS — K5780 Diverticulitis of intestine, part unspecified, with perforation and abscess without bleeding: Secondary | ICD-10-CM

## 2013-10-23 DIAGNOSIS — I420 Dilated cardiomyopathy: Secondary | ICD-10-CM

## 2013-10-23 DIAGNOSIS — I428 Other cardiomyopathies: Secondary | ICD-10-CM

## 2013-10-23 LAB — MDC_IDC_ENUM_SESS_TYPE_INCLINIC
Battery Voltage: 3.13 V
Brady Statistic RV Percent Paced: 0 %
Date Time Interrogation Session: 20150611094952
HighPow Impedance: 85 Ohm
Lead Channel Impedance Value: 589 Ohm
Lead Channel Pacing Threshold Amplitude: 0.5 V
Lead Channel Pacing Threshold Pulse Width: 0.4 ms
Lead Channel Sensing Intrinsic Amplitude: 9.5 mV
Lead Channel Setting Pacing Amplitude: 2.5 V
Lead Channel Setting Pacing Pulse Width: 0.4 ms
Lead Channel Setting Sensing Sensitivity: 0.3 mV
Zone Setting Detection Interval: 300 ms
Zone Setting Detection Interval: 360 ms
Zone Setting Detection Interval: 450 ms

## 2013-10-23 MED ORDER — OXYCODONE HCL 5 MG PO TABS: 2.5000 mg | ORAL_TABLET | ORAL | Status: DC | PRN

## 2013-10-23 NOTE — Assessment & Plan Note (Signed)
Her symptoms remain class 2 and have been amazingly well compensated. Will follow.

## 2013-10-23 NOTE — Telephone Encounter (Signed)
plz double check with patient - looks like Dr. Georgette Dover already filled this.

## 2013-10-23 NOTE — Progress Notes (Signed)
HPI Ashley [name] returns today for followup. She is a pleasant 68 yo woman with an DCM, chronic systolic CHF, s/p ICD implant. In the interim, she has had diverticulitis complicated by viscous perforation, abscess and drainage with a diverting colostomy. 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 She denies fevers or chills but has severe abdominal pain. She is trying to heal by secondary intention. Allergies  Allergen Reactions  . Antihistamines, Chlorpheniramine-Type Other (See Comments)    Makes her eyes look like she sees strobe lights  . Influenza Vac Split [Flu Virus Vaccine] Other (See Comments)    "allergic to concentrated eggs that are in vaccine"  . Penicillins     REACTION: "told 50 years ago I couldn't take it; might be immune to it", able to take amoxicillin  . Sulfasalazine Other (See Comments)    "makes my whole body smell like sulfa & makes me sick just smelling it"  . Atorvastatin Other (See Comments)    Severe muscle cramps to generic atorvastatin.  Able to take brand lipitor.  . Latex Nausea Only and Rash  . Rosuvastatin Other (See Comments)    Was not effective controlling lipids  . Chlorhexidine Other (See Comments)    blisters  . Codeine Other (See Comments)    Brain not clear  . Cyclobenzaprine Other (See Comments)    Adverse reaction - back and throat pain, dizziness, exhaustion  . Cymbalta [Duloxetine Hcl] Other (See Comments)    "body spasms and made me feel weird in my head"   . Metaxalone   . Prevacid [Lansoprazole] Other (See Comments)    Worsened GI side effects  . Red Dye     Nausea, swelling     Current Outpatient Prescriptions  Medication Sig Dispense Refill  . acetaminophen (TYLENOL) 325 MG tablet Take 1-2 tablets (325-650 mg total) by mouth every 6 (six) hours as needed for mild pain (or Fever >/= 101).      Marland Kitchen ALPRAZolam (XANAX) 0.25 MG  tablet TAKE 1 TABLET TWICE A DAY  60 tablet  0  . carvedilol (COREG) 12.5 MG tablet Take 6.25 mg by mouth 2 (two) times daily with a meal.      . dexlansoprazole (DEXILANT) 60 MG capsule Take 1 capsule (60 mg total) by mouth daily.  30 capsule  3  . diclofenac sodium (VOLTAREN) 1 % GEL Apply 1 application topically 3 (three) times daily.  1 Tube  1  . oxyCODONE (OXY IR/ROXICODONE) 5 MG immediate release tablet Take 2.5 mg by mouth every 4 (four) hours as needed for moderate pain or severe pain.      Marland Kitchen spironolactone (ALDACTONE) 25 MG tablet TAKE 25 mg TABLET BY MOUTH DAILY.      Marland Kitchen zolpidem (AMBIEN) 5 MG tablet TAKE 1/2 TO 1 TABLET AT BEDTIME AS NEEDED  30 tablet  1  . [DISCONTINUED] bisoprolol (ZEBETA) 5 MG tablet Take 5 mg by mouth daily.       No current facility-administered medications for this visit.     Past Medical History  Diagnosis Date  . Migraine without aura, without mention of intractable migraine without mention of status migrainosus   . History of diverticulitis of colon   . Pure hypercholesterolemia   . Irritable bowel syndrome   . Laryngeal nodule     Dr. Thomasena Edis  . Fibrocystic breast disease   . Osteoarthritis   . Hemorrhoids   .  Anxiety   . GERD (gastroesophageal reflux disease)   . Systolic CHF     EF 65-46%, LV dilation, significant MR, goal weight 150lbs  . Nonischemic cardiomyopathy   . History of pneumonia 2012  . Mitral regurgitation   . Anxiety   . Bronchiectasis     possibly mild, treat URIs aggressively  . Hypertension   . Heart murmur   . Angina   . Lung disease 07/14/11  . Sleep apnea   . Shortness of breath     "resting"  . Anemia   . Fibromyalgia   . Depression   . Disorder of vocal cords   . Chronic sinusitis   . Insomnia   . Osteoporosis 01/2013  . sigmoid diverticulitis with perforation, abscess and fistula 09/30/2013  . H/O multiple allergies 10/10/2013    ROS:   All systems reviewed and negative except as noted in the HPI.   Past  Surgical History  Procedure Laterality Date  . Appendectomy    . Mandible surgery      "got about 6 pins in the bottom of my jaw"  . Knee arthroscopy w/ orif      Left; "meniscus tear"  . Osteotomy      Left foot  . 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 Dexa  03/28/99    osteopenia L/S  . Colonoscopy  1998    nml (sigmoid-Gilbert-nol)  . Head mri, head ct  10/1998    ? H/A focus ([facility])  . Emg/mcv  10/16/01    + mild carpal tunnel  . Dexa  03/12/03    2.2 Spine 0.1 Hip  Osteopenia  . Mri right hip  12/12/01    Tendonitis Gluteus Medius to Gtr Trochanter Neg Bursitis (Dr. Eulas Post)  . L/s films  11/21/01    nml; Right hip nml  . Mri u/s  11/21/01    min. DDD L/3-4  . Colonoscopy  01/04/04    polyps, diverticulosis/severe  . Dexa  11/14/05    1.9 Spine 0.3 Hip  slight improvement  . Flex laryngoscopy  06/11/06    ([facility]) nml  . Dexa  11/22/06    improved osteo and fem neck  . [place] bunionectomy  11/04/08    Right Great Toe (Dr. Beola Cord)  . Breast mass excision  01/2010    Left-fibrocystic change w/intraductal papilloma, no malignancy  . Ct chest  12/2010    possible bronchiectasis lower lobes, 02/2011 - enlarged bilateral effusions, bibasilar atx  . Stress myoview  02/2011    no significant ischemia  . Ct chest  04/2011    no PE.  abnormal R hilar and mediastinal adenopathy --> on rpt 05/2011, resolved  . US echocardiography  04/2011    mildly dilated LV, EF 25-30%, hypokinesis, restrictive physiology, mod MR, no AS  . Icd placement  07/14/11    w/pacemaker  . Tonsillectomy and adenoidectomy    . Breast biopsy      "I've had 2-3 bx; all benign"  . Augmentation mammaplasty    . 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 Abdominal hysterectomy  1987    w/BSO  . Lasik      left eye  . Knee surgery      left  . Mandible surgery    . Sinus surgery      x3 with balloon   . Dexa  01/2013  T score -2.6 at spine  . Partial colectomy  N/A 10/02/2013    Procedure: PARTIAL COLECTOMY;  Surgeon: Imogene Burn. Georgette Dover, MD;  Location: [place];  Service: General;  Laterality: N/A;  . Colostomy N/A 10/02/2013    Procedure: DESCENDING COLOSTOMY;  Surgeon: Imogene Burn. Georgette Dover, MD;  Location: [place] [facility];  Service: General;  Laterality: N/A;     Family History  Problem Relation Age of Onset  . Lung cancer Father     + smoker  . Dementia Mother   . Osteoporosis Mother     Lumbar spine  . Stroke Mother     x 5 @ 87 YOA  . Arthritis Mother     hands  . Emphysema Mother   . Alcohol abuse Paternal Grandfather   . Hypertension Maternal Grandfather     ?  . Stroke Paternal Grandfather     ?  Marland Kitchen Heart disease      grandmother  . Heart disease      grandfather     History   Social History  . Marital Status: Married    Spouse Name: N/A    Number of Children: 1  . Years of Education: N/A   Occupational History  . 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 [employer]    Social History Main Topics  . Smoking status: Never Smoker   . Smokeless tobacco: Never Used     Comment: Lived with smokers x 23 yrs, smoked herself "for a week"  . Alcohol Use: No  . Drug Use: No  . Sexual Activity: Not Currently   Other Topics Concern  . Not on file   Social History Narrative   1 adopted child      Lives with husband              BP 114/58  Pulse 95  Ht 5' 5.5" (1.664 m)  Wt 149 lb (67.586 kg)  BMI 24.41 kg/m2  Physical Exam:  Chronically ill appearing NAD HEENT: Unremarkable Neck:  No JVD, no thyromegally Back:  No CVA tenderness Lungs:  Clear with no wheezes, rales, or rhonchi. HEART:  Regular rate rhythm, no murmurs, no rubs, no clicks Abd:  soft, positive bowel sounds, no organomegally, no rebound, no guarding, colostomy in place Ext:  2 plus pulses, no edema, no cyanosis, no clubbing Skin:  No rashes no nodules Neuro:  CN II through XII intact, motor grossly intact  DEVICE  Normal device function.   See PaceArt for details.   Assess/Plan:

## 2013-10-23 NOTE — Telephone Encounter (Signed)
Pt was seen by Dr Georgette Dover today. Pt states that she didn't realize that she was almost out of her Oxycodone 5mg . Pt rates her pain a 7 at this time. Informed pt that I would send Dr Georgette Dover a message for the refill request. Informed pt that if he refills the meds they would need to pick up the Rx in the office. Pt verbalized understanding

## 2013-10-23 NOTE — Telephone Encounter (Signed)
Pt called this morning concerned because she is having some bleeding coming out of her staples. She is seeing Dr. Georgette Dover this afternoon at 1:40pm for this issue. Does she need to be seen by you or is it ok to just see Dr. Georgette Dover?

## 2013-10-23 NOTE — Patient Instructions (Signed)
Your physician wants you to follow-up in: 12 months with Dr Knox Saliva will receive a reminder letter in the mail two months in advance. If you don't receive a letter, please call our office to schedule the follow-up appointment.  Remote monitoring is used to monitor your Pacemaker or ICD from home. This monitoring reduces the number of office visits required to check your device to one time per year. It allows [place] to keep an eye on the functioning of your device to ensure it is working properly. You are scheduled for a device check from home on 01/26/14. You may send your transmission at any time that day. If you have a wireless device, the transmission will be sent automatically. After your physician reviews your transmission, you will receive a postcard with your next transmission date.

## 2013-10-23 NOTE — Telephone Encounter (Signed)
I just received this message. Her appt has already passed with surgeon.

## 2013-10-23 NOTE — Telephone Encounter (Signed)
I just got this message - I recommend she see Dr. Georgette Dover.

## 2013-10-23 NOTE — Assessment & Plan Note (Signed)
Her Medtronic ICD is working normally. Will recheck in several months.

## 2013-10-23 NOTE — Assessment & Plan Note (Signed)
She has followup scheduled with her surgeon this afternoon.

## 2013-10-23 NOTE — Telephone Encounter (Signed)
Pt left v/m ; pt saw surgeon earlier today and pt did not get a rx for oxycodone. Pt request Dr Darnell Level to write rx for oxycodone; pt only has 3 pills left. Pt request cb ASAP.

## 2013-10-23 NOTE — Telephone Encounter (Signed)
Informed pt that Dr Georgette Dover has approved her Oxycodone refill and it will be ready for her to pick up in the am at the front desk.

## 2013-10-24 ENCOUNTER — Encounter (INDEPENDENT_AMBULATORY_CARE_PROVIDER_SITE_OTHER): Payer: Medicare Other | Admitting: General Surgery

## 2013-10-24 ENCOUNTER — Telehealth (INDEPENDENT_AMBULATORY_CARE_PROVIDER_SITE_OTHER): Payer: Self-pay | Admitting: Surgery

## 2013-10-24 ENCOUNTER — Encounter (INDEPENDENT_AMBULATORY_CARE_PROVIDER_SITE_OTHER): Payer: Self-pay | Admitting: Surgery

## 2013-10-24 NOTE — Telephone Encounter (Signed)
Patient's husband called requesting an Rx for oxycodone.  Dr. Georgette Dover authorized a new Rx for oxycodone yesterday, 10/23/2013.  It is at the desk at [place] office per the office staff notes.  Patient's husband states he forgot to pick it up today during business hours.  I told him to contact one of the call doctors on Saturday morning at 9 AM and we would arrange for him to pick up an Rx at the hospital.  Earnstine Regal, MD, [facility] Office: [phone]

## 2013-10-24 NOTE — Telephone Encounter (Signed)
Error.  Ashley Regal, MD, [facility] Office: [phone]

## 2013-10-24 NOTE — Telephone Encounter (Signed)
Spoke with patient and she did get Rx from Dr. Prince Solian. She is picking it up today.

## 2013-10-24 NOTE — Progress Notes (Signed)
Status post sigmoid colectomy with Hartman's procedure Performed urgently on 10/02/13. She developed a postoperative superficial wound infection which was opened in the office.  Home health is assisting with dressing changes. The patient remains quite anxious and complains of a lot of skin irritation although there is no visual sign of any skin irritation. Her colostomy is functioning well although she occasionally has some fairly firm stool. She continues to take pain medicine on a regular basis. She has been seen by her primary care physician as well as cardiology.  She has developed a little bit of bloody vaginal drainage. She also describes a sharp sensation in her rectum. She thinks that she can feel the staples used to staple off her rectum.  Her midline wound is fairly clean and is very well granulated. Her husband is cutting Curlex gauze to pack into the wound but this is leaving some stray cotton fibers. I encouraged him to use precut 4x4 gauze rather than cutting gauze off of a roll of Kerlix.  There is a central area that seems to be a bit deeper. He is packing with gauze done at this area with a cotton swab. The colostomy is functioning well. I encouraged her to use MiraLAX when necessary if the stool becomes too firm. I reassured her that there is no possible way that she can feel the small staples used to staple off a rectum. The bloody vaginal drainage is likely from her healing colovaginal fistula. There are no staples or sutures in her vaginal cuff.  The patient is quite anxious and requires a lot of reassurance. I think overall she is doing reasonably well. Continue with dressing changes. Recheck in 2-3 weeks.  Ashley [name]. Georgette Dover, MD, [facility]  General/ Trauma Surgery  10/24/2013 8:48 AM

## 2013-10-27 ENCOUNTER — Encounter (INDEPENDENT_AMBULATORY_CARE_PROVIDER_SITE_OTHER): Payer: Federal, State, Local not specified - PPO | Admitting: Surgery

## 2013-10-31 ENCOUNTER — Encounter: Payer: Self-pay | Admitting: Internal Medicine

## 2013-11-07 ENCOUNTER — Encounter (INDEPENDENT_AMBULATORY_CARE_PROVIDER_SITE_OTHER): Payer: Self-pay | Admitting: Surgery

## 2013-11-07 ENCOUNTER — Ambulatory Visit (INDEPENDENT_AMBULATORY_CARE_PROVIDER_SITE_OTHER): Payer: Medicare Other | Admitting: Surgery

## 2013-11-07 VITALS — BP 108/66 | HR 72 | Temp 97.8°F | Resp 16 | Ht 65.5 in | Wt 152.0 lb

## 2013-11-07 DIAGNOSIS — K5780 Diverticulitis of intestine, part unspecified, with perforation and abscess without bleeding: Secondary | ICD-10-CM

## 2013-11-07 DIAGNOSIS — K5732 Diverticulitis of large intestine without perforation or abscess without bleeding: Secondary | ICD-10-CM

## 2013-11-07 DIAGNOSIS — K630 Abscess of intestine: Secondary | ICD-10-CM

## 2013-11-07 DIAGNOSIS — K631 Perforation of intestine (nontraumatic): Secondary | ICD-10-CM

## 2013-11-07 MED ORDER — OXYCODONE HCL 5 MG PO TABS: 2.5000 mg | ORAL_TABLET | ORAL | Status: DC | PRN

## 2013-11-07 NOTE — Progress Notes (Signed)
Status post Hartman's procedure on 10/02/13. Her wound is healing well with wet-to-dry dressings. It is much smaller and is clean well granulated. The vaginal drainage has stopped. Her colostomy is functioning well and she is trying to avoid constipation with good hydration and occasional MiraLAX.  Continue wet-to-dry dressings until completely healed. She may be a candidate for colostomy reversal in September or October. We will reevaluate her in one month.  Imogene Burn. Georgette Dover, MD, [facility]  General/ Trauma Surgery  11/07/2013 3:00 PM

## 2013-11-18 ENCOUNTER — Telehealth: Payer: Self-pay | Admitting: Family Medicine

## 2013-11-18 MED ORDER — CLOBETASOL PROPIONATE 0.05 % EX CREA: 1.0000 "application " | TOPICAL_CREAM | Freq: Two times a day (BID) | CUTANEOUS | Status: DC

## 2013-11-18 NOTE — Telephone Encounter (Signed)
Patient Information:  Caller Name: Mae  Phone: [phone]  Patient: Ashley [name], Ashley [name]  Gender: Female  DOB: 28-Sep-1945  Age: 68 Years  PCP: [name] [facility])  Office Follow Up:  Does the office need to follow up with this patient?: Yes  Instructions For The Office: Patient is requesting something for poison oak and Ivy. She is concerned about any additional medication that may cause constipation due to colostomy.  She wants something to help dry up the poison ivy due to open surgical wound/packing and colostomy.  pharmacy- [phone] PLEASE CONTACT PATIENT REGARDING ISSUES.  RN Note:  Patient is requesting something for poison oak and Ivy. She is concerned about any additional medication that may cause constipation due to colostomy.  She wants something to help dry up the poison ivy due to open surgical wound/packing and colostomy.  pharmacy- [phone] PLEASE CONTACT PATIENT REGARDING ISSUES.  Symptoms  Reason For Call & Symptoms: Patient states she has Poison Ivy on both hands. Onset  2 days ago 11/15/13.  [place] below index finger and thumbs describes as a strip.  She has covered the area with bandaids to keep from spreading.  She states she recently had colon surgery with recent open wound  and colostomy.  She  is very concerned about getting near  a surgical site.  Reviewed Health History In EMR: Yes  Reviewed Medications In EMR: Yes  Reviewed Allergies In EMR: Yes  Reviewed Surgeries / Procedures: Yes  Date of Onset of Symptoms: 11/15/2013  Treatments Tried: Washed with Dawn liquid, Triple antibiotic ointment  Treatments Tried Worked: No  Guideline(s) Used:  Poison Ivy - Oak or [employer]  Disposition Per Guideline:   Home Care  Reason For Disposition Reached:   Poison [place], [place], or [place] with no complications  Advice Given:  Hydrocortisone Cream for Itching:   Apply 1% hydrocortisone cream 4 times a day to reduce itching.  Use it for 5 days.  Keep the cream in the refrigerator (Reason: it feels better if applied cold).  Available over-the-counter in [place] as 0.5% and 1% cream.  Apply Cold to the Area:  Soak the involved area in cool water for 20 minutes or massage it with an ice cube as often as necessary to reduce itching and oozing.  Oral Antihistamine Medication for Itching:   Antihistamines may cause sleepiness. Do not drink, drive, or operate dangerous machinery while taking antihistamines.  An over-the-counter antihistamine that causes less sleepiness is loratadine (e.g., Alavert or Claritin).  Read the package instructions thoroughly on all medications that you take.  Avoid Scratching:   Cut your fingernails short and try not to scratch so as to prevent a secondary infection from bacteria.  Call Back If:  Rash lasts longer than 3 weeks  It looks infected  You become worse.  Expected Course:  Usually lasts 2 weeks. Treatment reduces the severity of the symptoms, not how long they last.  RN Overrode Recommendation:  Patient Requests Prescription  Patient is requesting something for poison oak and Ivy. She is concerned about any additional medication that may cause constipation due to colostomy.  She wants something to help dry up the poison ivy due to open surgical wound/packing and colostomy.  pharmacy- [phone] PLEASE CONTACT PATIENT REGARDING ISSUES.

## 2013-11-18 NOTE — Telephone Encounter (Signed)
Left detailed message on voicemail.  

## 2013-11-18 NOTE — Telephone Encounter (Signed)
plz notify I've sent in prescription strength steroid cream to use on poison ivy spots. Update if not improved with this or spreading.

## 2013-11-21 DIAGNOSIS — H60509 Unspecified acute noninfective otitis externa, unspecified ear: Secondary | ICD-10-CM | POA: Diagnosis not present

## 2013-11-21 DIAGNOSIS — J328 Other chronic sinusitis: Secondary | ICD-10-CM | POA: Diagnosis not present

## 2013-11-21 DIAGNOSIS — H6120 Impacted cerumen, unspecified ear: Secondary | ICD-10-CM | POA: Diagnosis not present

## 2013-11-24 ENCOUNTER — Encounter (INDEPENDENT_AMBULATORY_CARE_PROVIDER_SITE_OTHER): Payer: Self-pay | Admitting: Surgery

## 2013-11-24 ENCOUNTER — Ambulatory Visit (INDEPENDENT_AMBULATORY_CARE_PROVIDER_SITE_OTHER): Payer: Medicare Other | Admitting: Surgery

## 2013-11-24 VITALS — BP 125/81 | HR 66 | Temp 98.6°F | Resp 14 | Ht 65.0 in | Wt 153.0 lb

## 2013-11-24 DIAGNOSIS — M999 Biomechanical lesion, unspecified: Secondary | ICD-10-CM | POA: Diagnosis not present

## 2013-11-24 DIAGNOSIS — K5780 Diverticulitis of intestine, part unspecified, with perforation and abscess without bleeding: Secondary | ICD-10-CM

## 2013-11-24 DIAGNOSIS — K5732 Diverticulitis of large intestine without perforation or abscess without bleeding: Secondary | ICD-10-CM

## 2013-11-24 DIAGNOSIS — K631 Perforation of intestine (nontraumatic): Secondary | ICD-10-CM

## 2013-11-24 DIAGNOSIS — M5137 Other intervertebral disc degeneration, lumbosacral region: Secondary | ICD-10-CM | POA: Diagnosis not present

## 2013-11-24 DIAGNOSIS — K630 Abscess of intestine: Secondary | ICD-10-CM

## 2013-11-24 DIAGNOSIS — IMO0002 Reserved for concepts with insufficient information to code with codable children: Secondary | ICD-10-CM | POA: Diagnosis not present

## 2013-11-24 NOTE — Progress Notes (Signed)
Status post Hartman's procedure on 10/02/13. Her wound is healing well with wet-to-dry dressings.  The wound is almost completely healed.  There is only about a 2 x 1 cm area of clean granulation tissue.  No further vaginal drainage. Her colostomy is functioning well and she is trying to avoid constipation with good hydration and occasional MiraLAX. The colostomy site has contracted slightly, so I recommended that she cut her ostomy appliance smaller to protect her skin.  Continue wet-to-dry dressings until completely healed. She may be a candidate for colostomy reversal in September or October.   We will reevaluate her in one month.   Imogene Burn. Georgette Dover, MD, [facility]  General/ Trauma Surgery

## 2013-11-25 DIAGNOSIS — M810 Age-related osteoporosis without current pathological fracture: Secondary | ICD-10-CM | POA: Diagnosis not present

## 2013-11-25 DIAGNOSIS — T8189XA Other complications of procedures, not elsewhere classified, initial encounter: Secondary | ICD-10-CM | POA: Diagnosis not present

## 2013-11-25 DIAGNOSIS — Z433 Encounter for attention to colostomy: Secondary | ICD-10-CM | POA: Diagnosis not present

## 2013-11-26 DIAGNOSIS — T8189XA Other complications of procedures, not elsewhere classified, initial encounter: Secondary | ICD-10-CM

## 2013-11-26 DIAGNOSIS — M810 Age-related osteoporosis without current pathological fracture: Secondary | ICD-10-CM

## 2013-11-26 DIAGNOSIS — Z433 Encounter for attention to colostomy: Secondary | ICD-10-CM | POA: Diagnosis not present

## 2013-11-26 DIAGNOSIS — I10 Essential (primary) hypertension: Secondary | ICD-10-CM

## 2013-11-28 ENCOUNTER — Other Ambulatory Visit: Payer: Self-pay | Admitting: Family Medicine

## 2013-12-01 DIAGNOSIS — M999 Biomechanical lesion, unspecified: Secondary | ICD-10-CM | POA: Diagnosis not present

## 2013-12-01 DIAGNOSIS — M5137 Other intervertebral disc degeneration, lumbosacral region: Secondary | ICD-10-CM | POA: Diagnosis not present

## 2013-12-01 DIAGNOSIS — IMO0002 Reserved for concepts with insufficient information to code with codable children: Secondary | ICD-10-CM | POA: Diagnosis not present

## 2013-12-02 ENCOUNTER — Telehealth: Payer: Self-pay | Admitting: *Deleted

## 2013-12-02 DIAGNOSIS — T8189XA Other complications of procedures, not elsewhere classified, initial encounter: Secondary | ICD-10-CM | POA: Diagnosis not present

## 2013-12-02 DIAGNOSIS — Z433 Encounter for attention to colostomy: Secondary | ICD-10-CM | POA: Diagnosis not present

## 2013-12-02 DIAGNOSIS — M810 Age-related osteoporosis without current pathological fracture: Secondary | ICD-10-CM | POA: Diagnosis not present

## 2013-12-02 NOTE — Telephone Encounter (Signed)
Infusion orders in your IN box for completion.

## 2013-12-04 NOTE — Telephone Encounter (Signed)
For imipenem ordered 09/2013 Signed and placed in my out box.

## 2013-12-11 DIAGNOSIS — M5137 Other intervertebral disc degeneration, lumbosacral region: Secondary | ICD-10-CM | POA: Diagnosis not present

## 2013-12-11 DIAGNOSIS — IMO0002 Reserved for concepts with insufficient information to code with codable children: Secondary | ICD-10-CM | POA: Diagnosis not present

## 2013-12-11 DIAGNOSIS — M999 Biomechanical lesion, unspecified: Secondary | ICD-10-CM | POA: Diagnosis not present

## 2013-12-12 DIAGNOSIS — M810 Age-related osteoporosis without current pathological fracture: Secondary | ICD-10-CM | POA: Diagnosis not present

## 2013-12-12 DIAGNOSIS — T8189XA Other complications of procedures, not elsewhere classified, initial encounter: Secondary | ICD-10-CM | POA: Diagnosis not present

## 2013-12-12 DIAGNOSIS — Z433 Encounter for attention to colostomy: Secondary | ICD-10-CM | POA: Diagnosis not present

## 2013-12-15 DIAGNOSIS — M5137 Other intervertebral disc degeneration, lumbosacral region: Secondary | ICD-10-CM | POA: Diagnosis not present

## 2013-12-15 DIAGNOSIS — M999 Biomechanical lesion, unspecified: Secondary | ICD-10-CM | POA: Diagnosis not present

## 2013-12-15 DIAGNOSIS — IMO0002 Reserved for concepts with insufficient information to code with codable children: Secondary | ICD-10-CM | POA: Diagnosis not present

## 2013-12-16 DIAGNOSIS — Z433 Encounter for attention to colostomy: Secondary | ICD-10-CM | POA: Diagnosis not present

## 2013-12-16 DIAGNOSIS — T8189XA Other complications of procedures, not elsewhere classified, initial encounter: Secondary | ICD-10-CM | POA: Diagnosis not present

## 2013-12-16 DIAGNOSIS — M810 Age-related osteoporosis without current pathological fracture: Secondary | ICD-10-CM | POA: Diagnosis not present

## 2013-12-18 DIAGNOSIS — M999 Biomechanical lesion, unspecified: Secondary | ICD-10-CM | POA: Diagnosis not present

## 2013-12-18 DIAGNOSIS — IMO0002 Reserved for concepts with insufficient information to code with codable children: Secondary | ICD-10-CM | POA: Diagnosis not present

## 2013-12-18 DIAGNOSIS — M5137 Other intervertebral disc degeneration, lumbosacral region: Secondary | ICD-10-CM | POA: Diagnosis not present

## 2013-12-22 ENCOUNTER — Ambulatory Visit (INDEPENDENT_AMBULATORY_CARE_PROVIDER_SITE_OTHER): Payer: Medicare Other | Admitting: Surgery

## 2013-12-22 ENCOUNTER — Encounter (INDEPENDENT_AMBULATORY_CARE_PROVIDER_SITE_OTHER): Payer: Self-pay | Admitting: Surgery

## 2013-12-22 ENCOUNTER — Encounter (INDEPENDENT_AMBULATORY_CARE_PROVIDER_SITE_OTHER): Payer: Medicare Other | Admitting: Surgery

## 2013-12-22 ENCOUNTER — Encounter (INDEPENDENT_AMBULATORY_CARE_PROVIDER_SITE_OTHER): Payer: Self-pay

## 2013-12-22 VITALS — BP 100/60 | HR 95 | Temp 96.8°F | Ht 65.0 in | Wt 156.0 lb

## 2013-12-22 DIAGNOSIS — M999 Biomechanical lesion, unspecified: Secondary | ICD-10-CM | POA: Diagnosis not present

## 2013-12-22 DIAGNOSIS — K5732 Diverticulitis of large intestine without perforation or abscess without bleeding: Secondary | ICD-10-CM

## 2013-12-22 DIAGNOSIS — M5137 Other intervertebral disc degeneration, lumbosacral region: Secondary | ICD-10-CM | POA: Diagnosis not present

## 2013-12-22 DIAGNOSIS — IMO0002 Reserved for concepts with insufficient information to code with codable children: Secondary | ICD-10-CM | POA: Diagnosis not present

## 2013-12-22 DIAGNOSIS — K5720 Diverticulitis of large intestine with perforation and abscess without bleeding: Secondary | ICD-10-CM

## 2013-12-22 MED ORDER — DEXTROSE 5 % IV SOLN: 900.0000 mg | INTRAVENOUS | Status: DC

## 2013-12-22 MED ORDER — GENTAMICIN SULFATE 40 MG/ML IJ SOLN: 5.0000 mg/kg | INTRAVENOUS | Status: DC

## 2013-12-22 NOTE — Progress Notes (Signed)
Patient ID: Ashley [name], female   DOB: Dec 08, 1945, 68 y.o.   MRN: [id number]  Chief Complaint  Patient presents with  . Follow-up    HPI Ashley [name] is a 68 y.o. female.   HPI This is a 68 year old female who was initially treated in [place] In early May of 2015 for perforated diverticulitis. She underwent laparoscopic irrigation and drain placement for perforated diverticulitis. A percutaneous drain was then placed into an abscess. She was discharged home after drain removal on 09/26/13.  She was then admitted to [facility] on 5/19 with elevated white count and abdominal pain. She had a persistent abscess I was unable to be drained by interventional radiology. Contrast leak from her colon and into the abscess. She underwent Hartman's procedure On 10/02/13. Her diet was slowly advanced and she received ostomy education. She was discharged home On 10/10/13. She developed a superficial skin infection and the wound was opened. The wound has been dressed by home health and is not completely healed. 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 She presents now to discuss possible colostomy reversal.  Past Medical History  Diagnosis Date  . Migraine without aura, without mention of intractable migraine without mention of status migrainosus   . History of diverticulitis of colon   . Pure hypercholesterolemia   . Irritable bowel syndrome   . Laryngeal nodule     Dr. Thelma Comp  . Fibrocystic breast disease   . Osteoarthritis   . Hemorrhoids   . Anxiety   . GERD (gastroesophageal reflux disease)   . Systolic CHF     EF 25-35%, LV dilation, significant MR, goal weight 150lbs  . Nonischemic cardiomyopathy   . History of pneumonia 2012  . Mitral regurgitation   . Anxiety   . Bronchiectasis     possibly mild, treat URIs aggressively  .  Hypertension   . Heart murmur   . Angina   . Lung disease 07/14/11  . Sleep apnea   . Shortness of breath     "resting"  . Anemia   . Fibromyalgia   . Depression   . Disorder of vocal cords   . Chronic sinusitis   . Insomnia   . Osteoporosis 01/2013  . sigmoid diverticulitis with perforation, abscess and fistula 09/30/2013  . H/O multiple allergies 10/10/2013    Past Surgical History  Procedure Laterality Date  . Appendectomy    . Mandible surgery      "got about 6 pins in the bottom of my jaw"  . Knee arthroscopy w/ orif      Left; "meniscus tear"  . Osteotomy      Left foot  . 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 Dexa  03/28/99    osteopenia L/S  . Colonoscopy  1998    nml (sigmoid-Gilbert-nol)  . Head mri, head ct  10/1998    ? H/A focus ([facility])  . Emg/mcv  10/16/01    + mild carpal tunnel  . Dexa  03/12/03    2.2 Spine 0.1 Hip  Osteopenia  . Mri right hip  12/12/01    Tendonitis Gluteus Medius to Gtr Trochanter Neg Bursitis (Dr. Montez Morita)  . L/s films  11/21/01    nml; Right hip nml  . Mri u/s  11/21/01    min. DDD L/3-4  . Colonoscopy  01/04/04    polyps, diverticulosis/severe  . Dexa  11/14/05    1.9 Spine 0.3 Hip  slight improvement  . Flex laryngoscopy  06/11/06    (Juengel) nml  . Dexa  11/22/06    improved osteo and fem neck  . [employer] bunionectomy  11/04/08    Right Great Toe (Dr. Lestine Box)  . Breast mass excision  01/2010    Left-fibrocystic change w/intraductal papilloma, no malignancy  . Ct chest  12/2010    possible bronchiectasis lower lobes, 02/2011 - enlarged bilateral effusions, bibasilar atx  . Stress myoview  02/2011    no significant ischemia  . Ct chest  04/2011    no PE.  abnormal R hilar and mediastinal adenopathy --> on rpt 05/2011, resolved  . US echocardiography  04/2011    mildly dilated LV, EF 25-30%, hypokinesis, restrictive physiology, mod MR, no AS  . Icd placement  07/14/11    w/pacemaker  . Tonsillectomy and adenoidectomy    . Breast  biopsy      "I've had 2-3 bx; all benign"  . Augmentation mammaplasty    . 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 Abdominal hysterectomy  1987    w/BSO  . Lasik      left eye  . Knee surgery      left  . Mandible surgery    . Sinus surgery      x3 with balloon   . Dexa  01/2013    T score -2.6 at spine  . Partial colectomy N/A 10/02/2013    Procedure: PARTIAL COLECTOMY;  Surgeon: Ashley Arms. [name], Ashley [name];  Location: [facility] OR;  Service: General;  Laterality: N/A;  . Colostomy N/A 10/02/2013    Procedure: DESCENDING COLOSTOMY;  Surgeon: Ashley Arms. [name], Ashley [name];  Location: [facility] OR;  Service: General;  Laterality: N/A;    Family History  Problem Relation Age of Onset  . Lung cancer Father     + smoker  . Dementia Mother   . Osteoporosis Mother     Lumbar spine  . Stroke Mother     x 5 @ 60 YOA  . Arthritis Mother     hands  . Emphysema Mother   . Alcohol abuse Paternal Grandfather   . Hypertension Maternal Grandfather     ?  . Stroke Paternal Grandfather     ?  Marland Kitchen Heart disease      grandmother  . Heart disease      grandfather    Social History History  Substance Use Topics  . Smoking status: Never Smoker   . Smokeless tobacco: Never Used     Comment: Lived with smokers x 23 yrs, smoked herself "for a week"  . Alcohol Use: No    Allergies  Allergen Reactions  . Antihistamines, Chlorpheniramine-Type Other (See Comments)    Makes her eyes look like she sees strobe lights  . Influenza Vac Split [Flu Virus Vaccine] Other (See Comments)    "allergic to concentrated eggs that are in vaccine"  . Penicillins     REACTION: "told 50 years ago I couldn't take it; might be immune to it", able to take amoxicillin  . Sulfasalazine Other (See Comments)    "makes my whole body smell like sulfa & makes me sick just smelling it"  . Atorvastatin Other (See Comments)    Severe muscle cramps to generic atorvastatin.  Able to take brand lipitor.  Marland Kitchen  Latex Nausea Only and Rash  . Rosuvastatin Other (See Comments)    Was not effective controlling lipids  . Chlorhexidine Other (See Comments)    blisters  . Codeine Other (See Comments)    Brain not clear  . Cyclobenzaprine Other (See Comments)    Adverse reaction - back and throat pain, dizziness, exhaustion  . Cymbalta [Duloxetine Hcl] Other (See Comments)    "body spasms and made me feel weird in my head"   . Metaxalone   . Prevacid [Lansoprazole] Other (See Comments)    Worsened GI side effects  . Red Dye     Nausea, swelling  . Tape     All adhesives     Current Outpatient Prescriptions  Medication Sig Dispense Refill  . acetaminophen (TYLENOL) 325 MG tablet Take 1-2 tablets (325-650 mg total) by mouth every 6 (six) hours as needed for mild pain (or Fever >/= 101).      Marland Kitchen alendronate (FOSAMAX) 70 MG tablet Take 70 mg by mouth once a week. Take with a full glass of water on an empty stomach.      . ALPRAZolam (XANAX) 0.25 MG tablet Take 1 tablet (0.25 mg total) by mouth 2 (two) times daily as needed for anxiety.      Marland Kitchen aspirin EC 81 MG tablet Take 1 tablet (81 mg total) by mouth daily.      . carvedilol (COREG) 12.5 MG tablet Take 6.25 mg by mouth 2 (two) times daily with a meal.      . clobetasol cream (TEMOVATE) 0.05 % Apply 1 application topically 2 (two) times daily. Apply to AA  30 g  0  . dexlansoprazole (DEXILANT) 60 MG capsule Take 1 capsule (60 mg total) by mouth daily.  30 capsule  3  . ferrous fumarate (HEMOCYTE - 106 MG FE) 325 (106 FE) MG TABS tablet Take 1 tablet by mouth.      . mometasone (ELOCON) 0.1 % lotion       . ofloxacin (FLOXIN) 0.3 % otic solution       . potassium chloride (K-DUR) 10 MEQ tablet Take 1 tablet (10 mEq total) by mouth daily.      . pravastatin (PRAVACHOL) 20 MG tablet Take 1 tablet (20 mg total) by mouth daily.      Marland Kitchen spironolactone (ALDACTONE) 25 MG tablet TAKE 25 mg TABLET BY MOUTH DAILY.      Marland Kitchen torsemide (DEMADEX) 5 MG tablet Take 1  tablet (5 mg total) by mouth daily.      . [DISCONTINUED] bisoprolol (ZEBETA) 5 MG tablet Take 5 mg by mouth daily.       Current Facility-Administered Medications  Medication Dose Route Frequency Provider Last Rate Last Dose  . clindamycin (CLEOCIN) 900 mg in dextrose 5 % 50 mL IVPB  900 mg Intravenous 60 min Pre-Op Ashley Arms. Thelma Lorenzetti, Ashley [name]       And  . gentamicin (GARAMYCIN) 350 mg in dextrose 5 % 50 mL IVPB  5 mg/kg Intravenous 60 min Pre-Op Ashley Arms. Kerigan Narvaez, Ashley [name]        Review of Systems Review of Systems  Constitutional: Negative for fever, chills and unexpected weight change.  HENT: Negative for congestion, hearing loss, sore throat, trouble swallowing and voice change.   Eyes: Negative for visual disturbance.  Respiratory: Negative for cough and wheezing.   Cardiovascular: Negative for chest pain, palpitations and leg swelling.  Gastrointestinal: Positive for abdominal pain and constipation. Negative for nausea, vomiting, diarrhea, blood in  stool, abdominal distention and anal bleeding.  Genitourinary: Negative for hematuria, vaginal bleeding and difficulty urinating.  Musculoskeletal: Negative for arthralgias.  Skin: Negative for rash and wound.  Neurological: Negative for seizures, syncope and headaches.  Hematological: Negative for adenopathy. Does not bruise/bleed easily.  Psychiatric/Behavioral: Negative for confusion.    Blood pressure 100/60, pulse 95, temperature 96.8 F (36 C), height 5\' 5"  (1.651 m), weight 156 lb (70.761 kg).  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    Plan    Cardiac clearance by Dr. Lewayne Bunting - last visit 6/15. Plan colostomy reversal ( possible loop ileostomy) in late September or early October.  The surgical procedure has been discussed with the patient.  Potential risks, benefits, alternative treatments, and expected outcomes have been explained.  All of the patient's questions at this time have been answered.  The likelihood of reaching the patient's treatment goal is good.  The patient understand the proposed surgical procedure and wishes to proceed.         Kayron Hicklin K. 12/22/2013, 1:04 PM

## 2013-12-24 DIAGNOSIS — M810 Age-related osteoporosis without current pathological fracture: Secondary | ICD-10-CM | POA: Diagnosis not present

## 2013-12-24 DIAGNOSIS — T8189XA Other complications of procedures, not elsewhere classified, initial encounter: Secondary | ICD-10-CM | POA: Diagnosis not present

## 2013-12-24 DIAGNOSIS — Z433 Encounter for attention to colostomy: Secondary | ICD-10-CM | POA: Diagnosis not present

## 2013-12-25 DIAGNOSIS — M999 Biomechanical lesion, unspecified: Secondary | ICD-10-CM | POA: Diagnosis not present

## 2013-12-25 DIAGNOSIS — IMO0002 Reserved for concepts with insufficient information to code with codable children: Secondary | ICD-10-CM | POA: Diagnosis not present

## 2013-12-25 DIAGNOSIS — M5137 Other intervertebral disc degeneration, lumbosacral region: Secondary | ICD-10-CM | POA: Diagnosis not present

## 2013-12-26 DIAGNOSIS — N899 Noninflammatory disorder of vagina, unspecified: Secondary | ICD-10-CM | POA: Diagnosis not present

## 2013-12-26 DIAGNOSIS — R350 Frequency of micturition: Secondary | ICD-10-CM | POA: Diagnosis not present

## 2013-12-28 ENCOUNTER — Emergency Department: Payer: Self-pay | Admitting: Internal Medicine

## 2013-12-28 DIAGNOSIS — Z91040 Latex allergy status: Secondary | ICD-10-CM | POA: Diagnosis not present

## 2013-12-28 DIAGNOSIS — S0180XA Unspecified open wound of other part of head, initial encounter: Secondary | ICD-10-CM | POA: Diagnosis not present

## 2013-12-28 DIAGNOSIS — T07XXXA Unspecified multiple injuries, initial encounter: Secondary | ICD-10-CM | POA: Diagnosis not present

## 2013-12-28 DIAGNOSIS — F411 Generalized anxiety disorder: Secondary | ICD-10-CM | POA: Diagnosis not present

## 2013-12-28 DIAGNOSIS — S6990XA Unspecified injury of unspecified wrist, hand and finger(s), initial encounter: Secondary | ICD-10-CM | POA: Diagnosis not present

## 2013-12-28 DIAGNOSIS — M542 Cervicalgia: Secondary | ICD-10-CM | POA: Diagnosis not present

## 2013-12-28 DIAGNOSIS — I10 Essential (primary) hypertension: Secondary | ICD-10-CM | POA: Diagnosis not present

## 2013-12-28 DIAGNOSIS — S0990XA Unspecified injury of head, initial encounter: Secondary | ICD-10-CM | POA: Diagnosis not present

## 2013-12-28 DIAGNOSIS — Z880 Allergy status to penicillin: Secondary | ICD-10-CM | POA: Diagnosis not present

## 2013-12-28 DIAGNOSIS — IMO0002 Reserved for concepts with insufficient information to code with codable children: Secondary | ICD-10-CM | POA: Diagnosis not present

## 2013-12-28 DIAGNOSIS — M79609 Pain in unspecified limb: Secondary | ICD-10-CM | POA: Diagnosis not present

## 2013-12-28 DIAGNOSIS — Z95810 Presence of automatic (implantable) cardiac defibrillator: Secondary | ICD-10-CM | POA: Diagnosis not present

## 2013-12-28 DIAGNOSIS — M2550 Pain in unspecified joint: Secondary | ICD-10-CM | POA: Diagnosis not present

## 2013-12-28 DIAGNOSIS — W010XXA Fall on same level from slipping, tripping and stumbling without subsequent striking against object, initial encounter: Secondary | ICD-10-CM | POA: Diagnosis not present

## 2013-12-28 DIAGNOSIS — S0003XA Contusion of scalp, initial encounter: Secondary | ICD-10-CM | POA: Diagnosis not present

## 2013-12-28 DIAGNOSIS — M169 Osteoarthritis of hip, unspecified: Secondary | ICD-10-CM | POA: Diagnosis not present

## 2013-12-28 DIAGNOSIS — S0993XA Unspecified injury of face, initial encounter: Secondary | ICD-10-CM | POA: Diagnosis not present

## 2013-12-28 DIAGNOSIS — S8990XA Unspecified injury of unspecified lower leg, initial encounter: Secondary | ICD-10-CM | POA: Diagnosis not present

## 2013-12-28 DIAGNOSIS — S298XXA Other specified injuries of thorax, initial encounter: Secondary | ICD-10-CM | POA: Diagnosis not present

## 2013-12-28 DIAGNOSIS — S59909A Unspecified injury of unspecified elbow, initial encounter: Secondary | ICD-10-CM | POA: Diagnosis not present

## 2013-12-28 DIAGNOSIS — M25579 Pain in unspecified ankle and joints of unspecified foot: Secondary | ICD-10-CM | POA: Diagnosis not present

## 2013-12-28 DIAGNOSIS — M25569 Pain in unspecified knee: Secondary | ICD-10-CM | POA: Diagnosis not present

## 2013-12-28 DIAGNOSIS — S1093XA Contusion of unspecified part of neck, initial encounter: Secondary | ICD-10-CM | POA: Diagnosis not present

## 2013-12-28 DIAGNOSIS — S99919A Unspecified injury of unspecified ankle, initial encounter: Secondary | ICD-10-CM | POA: Diagnosis not present

## 2013-12-28 DIAGNOSIS — S199XXA Unspecified injury of neck, initial encounter: Secondary | ICD-10-CM | POA: Diagnosis not present

## 2013-12-28 DIAGNOSIS — M1610 Unilateral primary osteoarthritis, unspecified hip: Secondary | ICD-10-CM | POA: Diagnosis not present

## 2013-12-28 DIAGNOSIS — R071 Chest pain on breathing: Secondary | ICD-10-CM | POA: Diagnosis not present

## 2013-12-29 ENCOUNTER — Other Ambulatory Visit: Payer: Self-pay | Admitting: Cardiovascular Disease

## 2013-12-29 DIAGNOSIS — H60509 Unspecified acute noninfective otitis externa, unspecified ear: Secondary | ICD-10-CM | POA: Diagnosis not present

## 2013-12-29 DIAGNOSIS — H6120 Impacted cerumen, unspecified ear: Secondary | ICD-10-CM | POA: Diagnosis not present

## 2013-12-29 DIAGNOSIS — M5137 Other intervertebral disc degeneration, lumbosacral region: Secondary | ICD-10-CM | POA: Diagnosis not present

## 2013-12-29 DIAGNOSIS — H903 Sensorineural hearing loss, bilateral: Secondary | ICD-10-CM | POA: Diagnosis not present

## 2013-12-29 DIAGNOSIS — IMO0002 Reserved for concepts with insufficient information to code with codable children: Secondary | ICD-10-CM | POA: Diagnosis not present

## 2013-12-29 DIAGNOSIS — M999 Biomechanical lesion, unspecified: Secondary | ICD-10-CM | POA: Diagnosis not present

## 2014-01-01 DIAGNOSIS — IMO0002 Reserved for concepts with insufficient information to code with codable children: Secondary | ICD-10-CM | POA: Diagnosis not present

## 2014-01-01 DIAGNOSIS — M999 Biomechanical lesion, unspecified: Secondary | ICD-10-CM | POA: Diagnosis not present

## 2014-01-01 DIAGNOSIS — M5137 Other intervertebral disc degeneration, lumbosacral region: Secondary | ICD-10-CM | POA: Diagnosis not present

## 2014-01-05 DIAGNOSIS — IMO0002 Reserved for concepts with insufficient information to code with codable children: Secondary | ICD-10-CM | POA: Diagnosis not present

## 2014-01-05 DIAGNOSIS — M5137 Other intervertebral disc degeneration, lumbosacral region: Secondary | ICD-10-CM | POA: Diagnosis not present

## 2014-01-05 DIAGNOSIS — M999 Biomechanical lesion, unspecified: Secondary | ICD-10-CM | POA: Diagnosis not present

## 2014-01-07 ENCOUNTER — Telehealth (INDEPENDENT_AMBULATORY_CARE_PROVIDER_SITE_OTHER): Payer: Self-pay

## 2014-01-07 ENCOUNTER — Telehealth: Payer: Self-pay | Admitting: Internal Medicine

## 2014-01-07 NOTE — Telephone Encounter (Signed)
Informed pt of this. Pt states that she has received the instructions and she will take the Miralax as directed, she was just nervous that she would have side effects. Informed pt that once she starts this before her sx and she needs anything to give our office a call. Pt verbalized understanding

## 2014-01-07 NOTE — Telephone Encounter (Signed)
Received request from Nurse fax box, documents faxed for surgical clearance. To: [facility]  Fax number: [phone] Attention: [phone]/km

## 2014-01-07 NOTE — Telephone Encounter (Signed)
Pt is calling to inform Dr Georgette Dover that the last time she had a bowel prep that she had really bad n/v, and fainting spells. Pt states that she is having a colostomy reversal and she wanted to remind him of this and she wanted to see if he would recommend any different types of bowel prep. Pt states that this is not going to be scheduled until late Sept. Informed pt that I would send this to Dr Georgette Dover for further recommendations. Pt verbalized understanding.

## 2014-01-07 NOTE — Telephone Encounter (Signed)
She should be using the Miralax prep with Neomycin and Metronidazole.  If she was not given these instructions at the last visit, let me know and we should send these to her by mail.  She already uses Miralax, but this will be a bigger dose.

## 2014-01-08 DIAGNOSIS — M5137 Other intervertebral disc degeneration, lumbosacral region: Secondary | ICD-10-CM | POA: Diagnosis not present

## 2014-01-08 DIAGNOSIS — IMO0002 Reserved for concepts with insufficient information to code with codable children: Secondary | ICD-10-CM | POA: Diagnosis not present

## 2014-01-08 DIAGNOSIS — M999 Biomechanical lesion, unspecified: Secondary | ICD-10-CM | POA: Diagnosis not present

## 2014-01-09 ENCOUNTER — Encounter: Payer: Self-pay | Admitting: Cardiovascular Disease

## 2014-01-09 ENCOUNTER — Ambulatory Visit (INDEPENDENT_AMBULATORY_CARE_PROVIDER_SITE_OTHER): Payer: Medicare Other | Admitting: Cardiovascular Disease

## 2014-01-09 VITALS — BP 120/82 | HR 80 | Ht 65.5 in | Wt 157.5 lb

## 2014-01-09 DIAGNOSIS — Z01810 Encounter for preprocedural cardiovascular examination: Secondary | ICD-10-CM | POA: Insufficient documentation

## 2014-01-09 DIAGNOSIS — I420 Dilated cardiomyopathy: Secondary | ICD-10-CM

## 2014-01-09 DIAGNOSIS — R5381 Other malaise: Secondary | ICD-10-CM

## 2014-01-09 DIAGNOSIS — I428 Other cardiomyopathies: Secondary | ICD-10-CM | POA: Diagnosis not present

## 2014-01-09 DIAGNOSIS — R5382 Chronic fatigue, unspecified: Secondary | ICD-10-CM

## 2014-01-09 DIAGNOSIS — Z95810 Presence of automatic (implantable) cardiac defibrillator: Secondary | ICD-10-CM

## 2014-01-09 DIAGNOSIS — R0602 Shortness of breath: Secondary | ICD-10-CM

## 2014-01-09 DIAGNOSIS — R5383 Other fatigue: Secondary | ICD-10-CM

## 2014-01-09 NOTE — Patient Instructions (Signed)
You are doing well. No medication changes were made.  Please call us if you have new issues that need to be addressed before your next appt.  Your physician wants you to follow-up in: 6 months.  You will receive a reminder letter in the mail two months in advance. If you don't receive a letter, please call our office to schedule the follow-up appointment.   

## 2014-01-09 NOTE — Assessment & Plan Note (Signed)
She is scheduled for ostomy repair at the end of September 2015 in [place]. No further testing or medication titration needed prior to the surgery. Would recommend minimizing IV fluids as much as tolerated in the perioperative period and postop to reduce the chance of acute on chronic heart failure.

## 2014-01-09 NOTE — Progress Notes (Signed)
Patient ID: Ashley [name], female    DOB: [birth year], 68 y.o.   MRN: [id number]  HPI Comments: 68 y/o female with history of fibromyalgia, irritable bowel, pneumonia, chronic cough felt secondary to GERD, abnormal PFTs in the past, shortness of breath dating back to September 2012 that presented acutely, CT scan showing progressive pleural effusion , echo showing Cardiomyopathy  ejection fraction estimated at 25-35%,  Previous  Lexiscan Myoview  showed no significant ischemia, no EKG changes concerning for ischemia.  EF now 50 to 55% in 02/2013  In followup today, she reports that she is feeling well. She's not taking torsemide and potassium on a regular basis, only as needed once or twice per minute. She is scheduled to have ostomy repair at the end of September 2015 with Dr. Gershon Crane     In general she has been active with no complaints apart from her chronic occasional muscle ache and fatigue She does report a fall coming out of church, she tripped and hit her right eye and right temporal area. Her weight is up 5 pounds but she denies any significant edema  Previous admission to [facility] in December 2012 for bronchitis and hypoxia as well as systolic CHF. She had antibiotics, prednisone and diuresis and has felt better since discharge. ACE inhibitor placed on hold secondary to chronic cough.  S/P ICD 10 days ago for EF <35%.  Goal weight had been in the low 150 range. Previous Cardiac MRI showed ejection fraction 34 %    ICD placed  by Dr. Lovena Le.  Previous symptoms of malaise and fatigue, tachycardia, weight gain, leg swelling . previously taking high doses of diuretics for weight gain causing renal dysfunction, dehydration. For creatinine 1.9 we suggested she cut back on her diuretics. Prior weight was in the low 160 pound range  History of diverticulosis, abscess formation, perforation requiring a long course of antibiotics with PICC line,  subsequent CT scan Sep 30 2013  showing free air, abscess requiring partial colectomy. She has an ostomy. Today she has lost 22 pounds by her report.   Previous echocardiogram shows improvement in her ejection fraction up to 50%. Challenging image quality. On my own review, it does appear at least 45, if not 50%.   EKG shows normal sinus rhythm with rate 80 beats per minute with  PVC noted      Outpatient Encounter Prescriptions as of 01/09/2014  Medication Sig  . acetaminophen (TYLENOL) 325 MG tablet Take 1-2 tablets (325-650 mg total) by mouth every 6 (six) hours as needed for mild pain (or Fever >/= 101).  Marland Kitchen alendronate (FOSAMAX) 70 MG tablet Take 70 mg by mouth once a week. Take with a full glass of water on an empty stomach.  . ALPRAZolam (XANAX) 0.25 MG tablet Take 1 tablet (0.25 mg total) by mouth 2 (two) times daily as needed for anxiety.  Marland Kitchen aspirin EC 81 MG tablet Take 1 tablet (81 mg total) by mouth daily.  . carvedilol (COREG) 12.5 MG tablet Take 6.25 mg by mouth 2 (two) times daily with a meal.  . clobetasol cream (TEMOVATE) 2.20 % Apply 1 application topically 2 (two) times daily as needed. Apply to AA  . spironolactone (ALDACTONE) 25 MG tablet TAKE 1/2 TABLET TWICE A DAY.   Review of Systems  Constitutional: Positive for fatigue.  HENT: Negative.   Eyes: Negative.   Respiratory: Negative.   Cardiovascular: Negative.   Gastrointestinal: Positive for abdominal pain.  Endocrine: Negative.  Musculoskeletal: Positive for arthralgias, back pain, myalgias and neck stiffness.  Skin: Negative.   Allergic/Immunologic: Negative.   Neurological: Negative.   Hematological: Negative.   Psychiatric/Behavioral: Negative.   All other systems reviewed and are negative.   BP 120/82  Pulse 80  Ht 5' 5.5" (1.664 m)  Wt 157 lb 8 oz (71.442 kg)  BMI 25.80 kg/m2  Physical Exam  Nursing note and vitals reviewed. Constitutional: She is oriented to person, place, and time. She appears well-developed and  well-nourished.  HENT:  Head: Normocephalic.  Nose: Nose normal.  Mouth/Throat: Oropharynx is clear and moist.  Eyes: Conjunctivae are normal. Pupils are equal, round, and reactive to light.  Neck: Normal range of motion. Neck supple. No JVD present.  Cardiovascular: Normal rate, regular rhythm, S1 normal, S2 normal, normal heart sounds and intact distal pulses.  Exam reveals no gallop and no friction rub.   No murmur heard. Pulmonary/Chest: Effort normal and breath sounds normal. No respiratory distress. She has no wheezes. She has no rales. She exhibits no tenderness.  Abdominal: Soft. Bowel sounds are normal. She exhibits no distension. There is no tenderness.  Musculoskeletal: Normal range of motion. She exhibits no edema and no tenderness.  Lymphadenopathy:    She has no cervical adenopathy.  Neurological: She is alert and oriented to person, place, and time. Coordination normal.  Skin: Skin is warm and dry. No rash noted. No erythema.  Psychiatric: She has a normal mood and affect. Her behavior is normal. Judgment and thought content normal.    Assessment and Plan

## 2014-01-09 NOTE — Assessment & Plan Note (Signed)
Most recent ejection fraction sewing normal systolic function. Overall appears stable, not requiring aggressive diuresis. Recent 5 pound weight gain, likely from food weight. No significant edema on exam today. No medication changes made. Suggested she monitor her weight, take diuretics as needed for any ankle edema

## 2014-01-09 NOTE — Assessment & Plan Note (Signed)
Chronic fatigue. Recommended she start a regular walking program.

## 2014-01-09 NOTE — Assessment & Plan Note (Signed)
Followed by EP 

## 2014-01-12 DIAGNOSIS — IMO0002 Reserved for concepts with insufficient information to code with codable children: Secondary | ICD-10-CM | POA: Diagnosis not present

## 2014-01-12 DIAGNOSIS — M5137 Other intervertebral disc degeneration, lumbosacral region: Secondary | ICD-10-CM | POA: Diagnosis not present

## 2014-01-12 DIAGNOSIS — M999 Biomechanical lesion, unspecified: Secondary | ICD-10-CM | POA: Diagnosis not present

## 2014-01-15 DIAGNOSIS — M5137 Other intervertebral disc degeneration, lumbosacral region: Secondary | ICD-10-CM | POA: Diagnosis not present

## 2014-01-15 DIAGNOSIS — IMO0002 Reserved for concepts with insufficient information to code with codable children: Secondary | ICD-10-CM | POA: Diagnosis not present

## 2014-01-15 DIAGNOSIS — M999 Biomechanical lesion, unspecified: Secondary | ICD-10-CM | POA: Diagnosis not present

## 2014-01-20 DIAGNOSIS — IMO0002 Reserved for concepts with insufficient information to code with codable children: Secondary | ICD-10-CM | POA: Diagnosis not present

## 2014-01-20 DIAGNOSIS — M999 Biomechanical lesion, unspecified: Secondary | ICD-10-CM | POA: Diagnosis not present

## 2014-01-20 DIAGNOSIS — M5137 Other intervertebral disc degeneration, lumbosacral region: Secondary | ICD-10-CM | POA: Diagnosis not present

## 2014-01-22 DIAGNOSIS — IMO0002 Reserved for concepts with insufficient information to code with codable children: Secondary | ICD-10-CM | POA: Diagnosis not present

## 2014-01-22 DIAGNOSIS — M5137 Other intervertebral disc degeneration, lumbosacral region: Secondary | ICD-10-CM | POA: Diagnosis not present

## 2014-01-22 DIAGNOSIS — M999 Biomechanical lesion, unspecified: Secondary | ICD-10-CM | POA: Diagnosis not present

## 2014-01-23 DIAGNOSIS — N952 Postmenopausal atrophic vaginitis: Secondary | ICD-10-CM | POA: Diagnosis not present

## 2014-01-23 DIAGNOSIS — R300 Dysuria: Secondary | ICD-10-CM | POA: Diagnosis not present

## 2014-01-23 DIAGNOSIS — Z124 Encounter for screening for malignant neoplasm of cervix: Secondary | ICD-10-CM | POA: Diagnosis not present

## 2014-01-23 DIAGNOSIS — M810 Age-related osteoporosis without current pathological fracture: Secondary | ICD-10-CM | POA: Diagnosis not present

## 2014-01-26 ENCOUNTER — Ambulatory Visit (INDEPENDENT_AMBULATORY_CARE_PROVIDER_SITE_OTHER): Payer: Medicare Other | Admitting: *Deleted

## 2014-01-26 DIAGNOSIS — I5022 Chronic systolic (congestive) heart failure: Secondary | ICD-10-CM

## 2014-01-26 DIAGNOSIS — I509 Heart failure, unspecified: Secondary | ICD-10-CM | POA: Diagnosis not present

## 2014-01-26 DIAGNOSIS — I428 Other cardiomyopathies: Secondary | ICD-10-CM

## 2014-01-26 DIAGNOSIS — I420 Dilated cardiomyopathy: Secondary | ICD-10-CM

## 2014-01-26 LAB — MDC_IDC_ENUM_SESS_TYPE_REMOTE
Battery Voltage: 3.1 V
Brady Statistic RV Percent Paced: 0 %
Date Time Interrogation Session: 20150914122156
HighPow Impedance: 79 Ohm
Lead Channel Impedance Value: 551 Ohm
Lead Channel Pacing Threshold Amplitude: 0.5 V
Lead Channel Pacing Threshold Pulse Width: 0.4 ms
Lead Channel Sensing Intrinsic Amplitude: 7.75 mV
Lead Channel Sensing Intrinsic Amplitude: 7.75 mV
Lead Channel Setting Pacing Amplitude: 2.5 V
Lead Channel Setting Pacing Pulse Width: 0.4 ms
Lead Channel Setting Sensing Sensitivity: 0.3 mV
Zone Setting Detection Interval: 300 ms
Zone Setting Detection Interval: 360 ms
Zone Setting Detection Interval: 450 ms

## 2014-01-26 NOTE — Progress Notes (Signed)
Remote ICD transmission.   

## 2014-01-27 ENCOUNTER — Ambulatory Visit: Payer: Medicare Other | Admitting: Family Medicine

## 2014-01-29 ENCOUNTER — Encounter: Payer: Self-pay | Admitting: Family Medicine

## 2014-01-29 ENCOUNTER — Ambulatory Visit (INDEPENDENT_AMBULATORY_CARE_PROVIDER_SITE_OTHER): Payer: Medicare Other | Admitting: Family Medicine

## 2014-01-29 VITALS — BP 104/66 | HR 91 | Temp 98.3°F | Wt 155.2 lb

## 2014-01-29 DIAGNOSIS — R05 Cough: Secondary | ICD-10-CM

## 2014-01-29 DIAGNOSIS — R059 Cough, unspecified: Secondary | ICD-10-CM

## 2014-01-29 DIAGNOSIS — Z85828 Personal history of other malignant neoplasm of skin: Secondary | ICD-10-CM | POA: Diagnosis not present

## 2014-01-29 DIAGNOSIS — N183 Chronic kidney disease, stage 3 unspecified: Secondary | ICD-10-CM

## 2014-01-29 DIAGNOSIS — R197 Diarrhea, unspecified: Secondary | ICD-10-CM | POA: Diagnosis not present

## 2014-01-29 DIAGNOSIS — E7800 Pure hypercholesterolemia, unspecified: Secondary | ICD-10-CM | POA: Diagnosis not present

## 2014-01-29 DIAGNOSIS — D500 Iron deficiency anemia secondary to blood loss (chronic): Secondary | ICD-10-CM | POA: Diagnosis not present

## 2014-01-29 DIAGNOSIS — L570 Actinic keratosis: Secondary | ICD-10-CM | POA: Diagnosis not present

## 2014-01-29 DIAGNOSIS — I509 Heart failure, unspecified: Secondary | ICD-10-CM

## 2014-01-29 DIAGNOSIS — R1084 Generalized abdominal pain: Secondary | ICD-10-CM | POA: Insufficient documentation

## 2014-01-29 DIAGNOSIS — Z1283 Encounter for screening for malignant neoplasm of skin: Secondary | ICD-10-CM | POA: Diagnosis not present

## 2014-01-29 DIAGNOSIS — I5022 Chronic systolic (congestive) heart failure: Secondary | ICD-10-CM

## 2014-01-29 LAB — TSH: TSH: 0.47 u[IU]/mL (ref 0.35–4.50)

## 2014-01-29 LAB — CBC WITH DIFFERENTIAL/PLATELET
Basophils Absolute: 0.1 10*3/uL (ref 0.0–0.1)
Basophils Relative: 1 % (ref 0.0–3.0)
Eosinophils Absolute: 0.1 10*3/uL (ref 0.0–0.7)
Eosinophils Relative: 1.2 % (ref 0.0–5.0)
HCT: 39 % (ref 36.0–46.0)
Hemoglobin: 12.9 g/dL (ref 12.0–15.0)
Lymphocytes Relative: 28.9 % (ref 12.0–46.0)
Lymphs Abs: 2.8 10*3/uL (ref 0.7–4.0)
MCHC: 33.1 g/dL (ref 30.0–36.0)
MCV: 86.2 fl (ref 78.0–100.0)
Monocytes Absolute: 0.7 10*3/uL (ref 0.1–1.0)
Monocytes Relative: 7.3 % (ref 3.0–12.0)
Neutro Abs: 5.9 10*3/uL (ref 1.4–7.7)
Neutrophils Relative %: 61.6 % (ref 43.0–77.0)
Platelets: 293 10*3/uL (ref 150.0–400.0)
RBC: 4.52 Mil/uL (ref 3.87–5.11)
RDW: 13.3 % (ref 11.5–15.5)
WBC: 9.6 10*3/uL (ref 4.0–10.5)

## 2014-01-29 LAB — BASIC METABOLIC PANEL
BUN: 33 mg/dL — ABNORMAL HIGH (ref 6–23)
CO2: 24 mEq/L (ref 19–32)
Calcium: 10.3 mg/dL (ref 8.4–10.5)
Chloride: 102 mEq/L (ref 96–112)
Creatinine, Ser: 1.2 mg/dL (ref 0.4–1.2)
GFR: 47.41 mL/min — ABNORMAL LOW (ref >60.00)
Glucose, Bld: 111 mg/dL — ABNORMAL HIGH (ref 70–99)
Potassium: 3.9 mEq/L (ref 3.5–5.1)
Sodium: 140 mEq/L (ref 135–145)

## 2014-01-29 LAB — HEPATIC FUNCTION PANEL
ALT: 24 U/L (ref 0–35)
AST: 24 U/L (ref 0–37)
Albumin: 4.7 g/dL (ref 3.5–5.2)
Alkaline Phosphatase: 125 U/L — ABNORMAL HIGH (ref 39–117)
Bilirubin, Direct: 0 mg/dL (ref 0.0–0.3)
Total Bilirubin: 0.3 mg/dL (ref 0.2–1.2)
Total Protein: 9.1 g/dL — ABNORMAL HIGH (ref 6.0–8.3)

## 2014-01-29 LAB — LIPID PANEL
Cholesterol: 300 mg/dL — ABNORMAL HIGH (ref 0–200)
HDL: 37.7 mg/dL — ABNORMAL LOW (ref >39.00)
NonHDL: 262.3
Total CHOL/HDL Ratio: 8
Triglycerides: 318 mg/dL — ABNORMAL HIGH (ref 0.0–149.0)
VLDL: 63.6 mg/dL — ABNORMAL HIGH (ref 0.0–40.0)

## 2014-01-29 LAB — LDL CHOLESTEROL, DIRECT: Direct LDL: 223.8 mg/dL

## 2014-01-29 MED ORDER — OMEPRAZOLE 40 MG PO CPDR
40.0000 mg | DELAYED_RELEASE_CAPSULE | Freq: Two times a day (BID) | ORAL | Status: DC
Start: 2014-01-29 — End: 2014-06-12

## 2014-01-29 NOTE — Assessment & Plan Note (Signed)
Pending upcoming colostomy reversal. Appreciate surgery care of patient. With recent abd pain/diarrhea - will check labwork today (CBC, CMP, TSH).

## 2014-01-29 NOTE — Progress Notes (Signed)
BP 104/66  Pulse 91  Temp(Src) 98.3 F (36.8 C) (Oral)  Wt 155 lb 4 oz (70.421 kg)  SpO2 99%   CC: check up prior to surgery Subjective:    Patient ID: Ashley [name], female    DOB: 01-12-1946, 68 y.o.   MRN: [id number]  HPI: Ashley [name] is a 68 y.o. female presenting on 01/29/2014 for Follow-up and Medication Management   Recent complicated hospitalization with perforated diverticulitis s/p colostomy.  Upcoming colostomy reversal surgery 02/10/2014.  On coreg 6.25mg  bid, spironolactone 25mg  daily. Recently restarted torsemide because of 8lb weight gain. Has taken 2-3 tablets over last few days.   3 wks ago had fall where she hit right side of face - slowly healing. Seen at [facility] ER with normal CT scan head and rib xrays. Increased coughing since fall productive of clear phlegm. +chills. No recent fevers.   Anemia - recheck today. Does not eat significant fiber in diet.   Increased diarrhea and abd pain over last few days.  Upcoming dexa/mammogram. Increased frequency of urination. Saw OBGYN - had urine checked. Waiting on response.  Wt Readings from Last 3 Encounters:  01/29/14 155 lb 4 oz (70.421 kg)  01/09/14 157 lb 8 oz (71.442 kg)  12/22/13 156 lb (70.761 kg)  Body mass index is 25.43 kg/(m^2).  Past Medical History  Diagnosis Date  . Migraine without aura, without mention of intractable migraine without mention of status migrainosus   . History of diverticulitis of colon   . Pure hypercholesterolemia   . Irritable bowel syndrome   . Laryngeal nodule     Dr. Thomasena Edis  . Fibrocystic breast disease   . Osteoarthritis   . Hemorrhoids   . Anxiety   . GERD (gastroesophageal reflux disease)   . Systolic CHF     EF 53-97%, LV dilation, significant MR, goal weight 150lbs  . Nonischemic cardiomyopathy   . History of pneumonia 2012  . Mitral regurgitation   . Anxiety   . Bronchiectasis     possibly mild, treat URIs aggressively  . Hypertension   . Heart  murmur   . Angina   . Lung disease 07/14/11  . Sleep apnea   . Shortness of breath     "resting"  . Anemia   . Fibromyalgia   . Depression   . Disorder of vocal cords   . Chronic sinusitis   . Insomnia   . Osteoporosis 01/2013  . sigmoid diverticulitis with perforation, abscess and fistula 09/30/2013  . H/O multiple allergies 10/10/2013    Relevant past medical, surgical, family and social history reviewed and updated as indicated.  Allergies and medications reviewed and updated. Current Outpatient Prescriptions on File Prior to Visit  Medication Sig  . acetaminophen (TYLENOL) 325 MG tablet Take 1-2 tablets (325-650 mg total) by mouth every 6 (six) hours as needed for mild pain (or Fever >/= 101).  Marland Kitchen alendronate (FOSAMAX) 70 MG tablet Take 70 mg by mouth once a week. Take with a full glass of water on an empty stomach.  . ALPRAZolam (XANAX) 0.25 MG tablet Take 1 tablet (0.25 mg total) by mouth 2 (two) times daily as needed for anxiety.  Marland Kitchen aspirin EC 81 MG tablet Take 1 tablet (81 mg total) by mouth daily.  . carvedilol (COREG) 12.5 MG tablet Take 6.25 mg by mouth 2 (two) times daily with a meal.  . clobetasol cream (TEMOVATE) 6.73 % Apply 1 application topically 2 (two) times daily as needed. Apply  to AA  . spironolactone (ALDACTONE) 25 MG tablet Take one tablet daily  . [DISCONTINUED] bisoprolol (ZEBETA) 5 MG tablet Take 5 mg by mouth daily.   No current facility-administered medications on file prior to visit.    Review of Systems Per HPI unless specifically indicated above    Objective:    BP 104/66  Pulse 91  Temp(Src) 98.3 F (36.8 C) (Oral)  Wt 155 lb 4 oz (70.421 kg)  SpO2 99%  Physical Exam  Nursing note and vitals reviewed. Constitutional: She appears well-developed and well-nourished. No distress.  HENT:  Mouth/Throat: Oropharynx is clear and moist. No oropharyngeal exudate.  Healing ecchymosis at level of R cheekbone  Cardiovascular: Normal rate, regular rhythm,  normal heart sounds and intact distal pulses.   No murmur heard. Pulmonary/Chest: Effort normal and breath sounds normal. No respiratory distress. She has no wheezes. She has no rales.  Abdominal: Soft. Normal appearance. She exhibits no distension and no mass. Bowel sounds are increased. There is no hepatosplenomegaly. There is generalized tenderness. There is no rigidity, no rebound, no guarding, no CVA tenderness and negative Murphy's sign.  Colostomy LLQ in place, dressings c/d/i  Musculoskeletal: She exhibits no edema.  Lymphadenopathy:    She has no cervical adenopathy.  Skin: Skin is warm and dry. No rash noted.  Psychiatric: Her mood appears anxious. She exhibits a depressed mood.       Assessment & Plan:   Problem List Items Addressed This Visit   HYPERCHOLESTEROLEMIA, PURE     Recheck FLP. H/o intolerance to statins in past.    Relevant Medications      torsemide (DEMADEX) 10 MG tablet   Other Relevant Orders      Lipid panel   Diarrhea   Relevant Orders      TSH      Hepatic function panel   Cough     ?GERD related - increase omeprazole to 40mg  bid.    CKD (chronic kidney disease) stage 3, GFR 30-59 ml/min     Recheck Cr today.    Relevant Orders      Basic metabolic panel      CBC with Differential   Chronic systolic CHF (congestive heart failure)     Chronic, stable. Continue coreg, spironolactone. Pt has recently restarted torsemide this week - check Cr and K today.    Relevant Medications      torsemide (DEMADEX) 10 MG tablet   Anemia - Primary     H/o IDA presumed 2/2 blood loss from surgeries, and from nutrition def after colectomy. Recheck CBC today.    Relevant Medications      cyanocobalamin 1000 MCG tablet   Abdominal pain, generalized     Recent abd discomfort but not acute abdomen on exam. Check CBC, CMP, TSH today. If worsening pain, low threshold to rpt CT scan given her complicated abdominal history.        Follow up plan: Return if  symptoms worsen or fail to improve.

## 2014-01-29 NOTE — Patient Instructions (Addendum)
Blood work today. We will call you with blood work results. Increase omeprazole to 40mg  twice daily. I have sent in higher dose to the pharmacy. I think you should do well with upcoming surgery! Let me know if any worsening abdominal pain, but we should hopefully have more answers when blood work returns. Start biotin for hair/nails.

## 2014-01-29 NOTE — Assessment & Plan Note (Signed)
Recent abd discomfort but not acute abdomen on exam. Check CBC, CMP, TSH today. If worsening pain, low threshold to rpt CT scan given her complicated abdominal history.

## 2014-01-29 NOTE — Progress Notes (Signed)
Pre visit review using our clinic review tool, if applicable. No additional management support is needed unless otherwise documented below in the visit note. 

## 2014-01-29 NOTE — Assessment & Plan Note (Signed)
Recheck FLP. H/o intolerance to statins in past.

## 2014-01-29 NOTE — Assessment & Plan Note (Signed)
Chronic, stable. Continue coreg, spironolactone. Pt has recently restarted torsemide this week - check Cr and K today.

## 2014-01-29 NOTE — Assessment & Plan Note (Signed)
- 

## 2014-01-29 NOTE — Assessment & Plan Note (Signed)
H/o IDA presumed 2/2 blood loss from surgeries, and from nutrition def after colectomy. Recheck CBC today.

## 2014-01-29 NOTE — Assessment & Plan Note (Signed)
?  GERD related - increase omeprazole to 40mg  bid.

## 2014-01-30 DIAGNOSIS — Z1231 Encounter for screening mammogram for malignant neoplasm of breast: Secondary | ICD-10-CM | POA: Diagnosis not present

## 2014-02-02 ENCOUNTER — Encounter: Payer: Self-pay | Admitting: Family Medicine

## 2014-02-03 ENCOUNTER — Encounter: Payer: Self-pay | Admitting: Cardiology

## 2014-02-04 ENCOUNTER — Telehealth: Payer: Self-pay

## 2014-02-04 ENCOUNTER — Encounter (HOSPITAL_COMMUNITY): Payer: Self-pay | Admitting: Pharmacy Technician

## 2014-02-04 ENCOUNTER — Encounter: Payer: Self-pay | Admitting: Internal Medicine

## 2014-02-04 NOTE — Telephone Encounter (Signed)
I would start at 1 tablet torsemide 10mg  daily and notify us if weight starts increasing despite this. Continue drinking water as per [employer] (2-3 glasses more than normal for 5 days)

## 2014-02-04 NOTE — Telephone Encounter (Signed)
Spoke w/ pt.  She reports that she is sched to have reconstructive surgery on her colon next week. She is concerned that her labs are abnormal and that she will either "have a stroke b/c my cholesterol is so high, or be put in kidney failure". Pt had labs drawn on 01/29/14, she lost her MyChart sign on and has been unable to view these results (Pt spoke w/ Gabriel Cirri to fix this). Dr. Bosie Clos nurse reviewed these w/ her and she is concerned that her kidneys are too dry.  She would like to know if she should "drink 2 more glasses of water a day w/ 1 torsemide or stop the torsemide altogether".  She states that Dr. [name] is aware of her upcoming surgery and that she has been cleared to proceed, but she would would like to know if this has changed based on her new lab results.  She states that she has appt w/ surgeon's nurse tomorrow to go over any questions and she would like to go to that meeting prepared.  Please advise.  Thank you.

## 2014-02-04 NOTE — Telephone Encounter (Signed)
Lab work suggests very mild dehydration, with cut back slightly on her diuretic, drink extra glass of water. That should be fine. not bad at all. Creatinine is normal which is the most important part.  As for cholesterol, I'm not sure I trust these numbers as her normal baseline is in the low 200 range She has never been in the 300 range. Would recommend after the surgery, she watch her diet, get back to her regular activities, and retest the numbers in 6 months time. Suspect this is a lab error

## 2014-02-04 NOTE — Telephone Encounter (Signed)
Pt called about lab results; pt has not been on mychart; pt notified as instructed from result note; pt has restarted torsemide 10 mg and presently taking 1 in AM and 2 late afternoon. Pt wants to know if needs to decrease torsemide along with drinking more water or should pt contact Dr [name] for advice. Pt wants to know if needs to stop torsemide prior to surgery. Pt's surgery scheduled for 02/10/14. Pt request cb.

## 2014-02-05 ENCOUNTER — Encounter: Payer: Self-pay | Admitting: Cardiology

## 2014-02-05 ENCOUNTER — Encounter (HOSPITAL_COMMUNITY)
Admission: RE | Admit: 2014-02-05 | Discharge: 2014-02-05 | Disposition: A | Discharge status: Home / Self Care | Payer: Medicare Other | Source: Ambulatory Visit | Attending: Surgery | Admitting: Surgery

## 2014-02-05 ENCOUNTER — Ambulatory Visit (HOSPITAL_COMMUNITY)
Admission: RE | Admit: 2014-02-05 | Discharge: 2014-02-05 | Disposition: A | Discharge status: Home / Self Care | Payer: Medicare Other | Source: Ambulatory Visit | Attending: Surgery | Admitting: Surgery

## 2014-02-05 ENCOUNTER — Encounter (HOSPITAL_COMMUNITY): Payer: Self-pay

## 2014-02-05 DIAGNOSIS — Z01810 Encounter for preprocedural cardiovascular examination: Secondary | ICD-10-CM | POA: Insufficient documentation

## 2014-02-05 DIAGNOSIS — Z950 Presence of cardiac pacemaker: Secondary | ICD-10-CM | POA: Diagnosis not present

## 2014-02-05 DIAGNOSIS — Z01818 Encounter for other preprocedural examination: Secondary | ICD-10-CM | POA: Diagnosis not present

## 2014-02-05 HISTORY — DX: Other specified postprocedural states: Z98.890

## 2014-02-05 HISTORY — DX: Presence of automatic (implantable) cardiac defibrillator: Z95.810

## 2014-02-05 HISTORY — DX: Nausea with vomiting, unspecified: R11.2

## 2014-02-05 HISTORY — DX: Malignant (primary) neoplasm, unspecified: C80.1

## 2014-02-05 LAB — HEMOGLOBIN A1C
Hgb A1c MFr Bld: 5.7 % — ABNORMAL HIGH (ref <5.7)
Mean Plasma Glucose: 117 mg/dL — ABNORMAL HIGH (ref <117)

## 2014-02-05 NOTE — Progress Notes (Signed)
Pt. Reports that she has had alcohol used for prep before surgery in the OR

## 2014-02-05 NOTE — Progress Notes (Signed)
Pt. Has multiple allergies, one being chlorohexidine; pt. Will use dial soap x2

## 2014-02-05 NOTE — Telephone Encounter (Signed)
Does she need to stop the torsemide prior to surgery or is it okay to keep taking 1 tab for now

## 2014-02-05 NOTE — Telephone Encounter (Signed)
Left message for pt to call back  °

## 2014-02-05 NOTE — Telephone Encounter (Signed)
Left vm for pt with instructions below.

## 2014-02-05 NOTE — Pre-Procedure Instructions (Signed)
Ashley [name]  02/05/2014   Your procedure is scheduled on:  02/10/2014  Report to [facility]    ENTRANCE A at   5:30 AM.  Call this number if you have problems the morning of surgery: [phone]   Remember:   Do not eat food or drink liquids after midnight.  On Monday NIGHT   Take these medicines the morning of surgery with A SIP OF WATER:  Alprazolam, carvedilol, omeprazole    Do not wear jewelry, make-up or nail polish.  Do not wear lotions, powders, or perfumes. You may wear deodorant.  Do not shave 48 hours prior to surgery.   Do not bring valuables to the hospital.  [facility] is not responsible  for any belongings or valuables.               Contacts, dentures or bridgework may not be worn into surgery.  Leave suitcase in the car. After surgery it may be brought to your room.  For patients admitted to the hospital, discharge time is determined by your                treatment team.               Patients discharged the day of surgery will not be allowed to drive  home.  Name and phone number of your driver: /w spouse  Special Instructions: Special Instructions: [place] - Preparing for Surgery  Before surgery, you can play an important role.  Because skin is not sterile, your skin needs to be as free of germs as possible.  You can reduce the number of germs on you skin by washing with CHG (chlorahexidine gluconate) soap before surgery.  CHG is an antiseptic cleaner which kills germs and bonds with the skin to continue killing germs even after washing.  Please DO NOT use if you have an allergy to CHG or antibacterial soaps.  If your skin becomes reddened/irritated stop using the CHG and inform your nurse when you arrive at Short Stay.  Do not shave (including legs and underarms) for at least 48 hours prior to the first CHG shower.  You may shave your face.  Please follow these instructions carefully:   1.  Shower with CHG Soap the night before  surgery and the  morning of Surgery.  2.  If you choose to wash your hair, wash your hair first as usual with your  normal shampoo.  3.  After you shampoo, rinse your hair and body thoroughly to remove the  Shampoo.  4.  Use CHG as you would any other liquid soap.  You can apply chg directly to the skin and wash gently with scrungie or a clean washcloth.  5.  Apply the CHG Soap to your body ONLY FROM THE NECK DOWN.    Do not use on open wounds or open sores.  Avoid contact with your eyes, ears, mouth and genitals (private parts).  Wash genitals (private parts)   with your normal soap.  6.  Wash thoroughly, paying special attention to the area where your surgery will be performed.  7.  Thoroughly rinse your body with warm water from the neck down.  8.  DO NOT shower/wash with your normal soap after using and rinsing off   the CHG Soap.  9.  Pat yourself dry with a clean towel.            10.  Wear clean pajamas.  11.  Place clean sheets on your bed the night of your first shower and do not sleep with pets.  Day of Surgery  Do not apply any lotions/deodorants the morning of surgery.  Please wear clean clothes to the hospital/surgery center.   Please read over the following fact sheets that you were given: Pain Booklet, Coughing and Deep Breathing and Surgical Site Infection Prevention

## 2014-02-05 NOTE — Progress Notes (Signed)
Pt. Had numerous ?'s & concerns during the PAT appt. Pt. Asking multiple times about her risk for problems due to history of reduced EF in the past & concern for fluid balance, relative to anesthesia.  Pt. Reassured several times that the anesthesia dept. Will review her chart & response to her needs before, during & after anesth.

## 2014-02-05 NOTE — Telephone Encounter (Signed)
Would continue 1 tablet for now.

## 2014-02-05 NOTE — Telephone Encounter (Signed)
Spoke w/ pt.  Advised her of Dr. Donivan Scull recommendation.  She verbalizes understanding and that she spoke w/ nurse educator today and feels better about the surgery after discussing w/ her.  Asked pt to call back w/ any further questions or concerns.

## 2014-02-06 NOTE — Progress Notes (Signed)
Anesthesia Chart Review:  Patient is a 68 year old female scheduled for colostomy reversal on 02/10/14 by Dr. Georgette Dover.    History includes non-smoker, sigmoid diverticulitis with perforation s/p sigmoid colectomy with descending colostomy 10/02/13, angina with negative stress test 02/2011, non-ischemic cardiomyopathy '12, chronic systolic CHF, s/p [employer] single chamber ICD 07/14/11, post-operative N/V, migraine, fibromyalgia, hypercholesterolemia, IBS, laryngeal nodule (Dr. Thomasena Edis), fibrocystic breast disease, migraines, CKD stage III, chronic cough felt related to GERD, SOB, osteoporosis, skin cancer, "borderline" OSA. PCP is Dr. Danise Mina who is aware of upcoming surgery.  Cardiologist is Dr. [name]. He is aware of plans for surgery and stated on 01/09/14, "No further testing or medication titration needed prior to the surgery. Would recommend minimizing IV fluids as much as tolerated in the perioperative period and postop to reduce the chance of acute on chronic heart failure." EP cardiologist is Dr. Lovena Le.    She has multiple drug allergies as listed in Epic.   EKG on 01/09/14 showed NSR, occasional ectopic ventricular beat.  Echo on 02/18/13 showed:  - Left ventricle: The cavity size was normal. Wall thickness was normal. Systolic function was normal. The estimated ejection fraction was in the range of 50% to 55%. Wall motion was normal; there were no regional wall motion abnormalities. There was an increased relative contribution of atrial contraction to ventricular filling. - Aortic valve: Valve area: 1.78cm^2(VTI). Valve area: 1.65cm^2 (Vmax). - Trivial mitral and tricuspid regurgitation.  Nuclear stress test on 03/08/11 showed: Pharmacological myocardial perfusion imaging study with no significant ischemia noted. Challenging images secondary to GI uptake artifact both at stress and rest. Thinning of the myocardium in the mid-to anterior wall and inferoapical wall is likely secondary to attenuation  artifact. Unable to determine if there are focal wall motion abnormality secondary to GI uptake artifact. EF estimated at 54%. No EKG changes concerning for ischemia. Overall this is a low risk scan.   CXR on 02/05/14 showed: No active cardiopulmonary disease.  Labs from 01/29/14 noted.  H/H 12.9/39.0.  Defer decision for T&S for this procedure to surgeon and/or anesthesiologist. A1C on 02/05/14 was 5.7.  Her cardiologist is aware of plans for surgery. Most recent EF 02/2013 was up to 50-55%.  We are still awaiting the ICD perioperative Rx form from Dr. Tanna Furry office, so chart will be left for RN follow-up. If no acute changes then I would anticipate that she could proceed as planned.  George Hugh [facility] Short Stay Center/Anesthesiology Phone [phone] 02/06/2014 1:19 PM

## 2014-02-09 MED ORDER — GENTAMICIN SULFATE 40 MG/ML IJ SOLN
5.0000 mg/kg | INTRAVENOUS | Status: AC
  Administered 2014-02-10: 350 mg via INTRAVENOUS
  Filled 2014-02-09: qty 8.75

## 2014-02-09 MED ORDER — CLINDAMYCIN PHOSPHATE 900 MG/50ML IV SOLN
900.0000 mg | INTRAVENOUS | Status: AC
  Administered 2014-02-10: 900 mg via INTRAVENOUS
  Filled 2014-02-09: qty 50

## 2014-02-09 NOTE — Progress Notes (Signed)
Notified Tomi Bamberger with [employer] of need to reprogram device per orders from Dr. Lovena Le. States she will be here at 7 am.

## 2014-02-10 ENCOUNTER — Encounter (HOSPITAL_COMMUNITY): Payer: Self-pay | Admitting: *Deleted

## 2014-02-10 ENCOUNTER — Encounter (HOSPITAL_COMMUNITY): Payer: Medicare Other | Admitting: Vascular Surgery

## 2014-02-10 ENCOUNTER — Encounter (HOSPITAL_COMMUNITY)
Admission: RE | Disposition: A | Discharge status: Home / Self Care | Payer: Self-pay | Source: Ambulatory Visit | Attending: Surgery

## 2014-02-10 ENCOUNTER — Inpatient Hospital Stay (HOSPITAL_COMMUNITY)
Admission: RE | Admit: 2014-02-10 | Discharge: 2014-02-20 | DRG: 330 | Disposition: A | Discharge status: Home / Self Care | Payer: Medicare Other | Source: Ambulatory Visit | Attending: Surgery | Admitting: Surgery

## 2014-02-10 ENCOUNTER — Ambulatory Visit (HOSPITAL_COMMUNITY): Payer: Medicare Other | Admitting: Anesthesiology

## 2014-02-10 DIAGNOSIS — R52 Pain, unspecified: Secondary | ICD-10-CM

## 2014-02-10 DIAGNOSIS — G4730 Sleep apnea, unspecified: Secondary | ICD-10-CM | POA: Diagnosis present

## 2014-02-10 DIAGNOSIS — K660 Peritoneal adhesions (postprocedural) (postinfection): Secondary | ICD-10-CM | POA: Diagnosis not present

## 2014-02-10 DIAGNOSIS — M1990 Unspecified osteoarthritis, unspecified site: Secondary | ICD-10-CM | POA: Diagnosis present

## 2014-02-10 DIAGNOSIS — M797 Fibromyalgia: Secondary | ICD-10-CM | POA: Diagnosis present

## 2014-02-10 DIAGNOSIS — N390 Urinary tract infection, site not specified: Secondary | ICD-10-CM | POA: Diagnosis not present

## 2014-02-10 DIAGNOSIS — I5022 Chronic systolic (congestive) heart failure: Secondary | ICD-10-CM | POA: Diagnosis not present

## 2014-02-10 DIAGNOSIS — R1030 Lower abdominal pain, unspecified: Secondary | ICD-10-CM | POA: Diagnosis not present

## 2014-02-10 DIAGNOSIS — M810 Age-related osteoporosis without current pathological fracture: Secondary | ICD-10-CM | POA: Diagnosis present

## 2014-02-10 DIAGNOSIS — I10 Essential (primary) hypertension: Secondary | ICD-10-CM | POA: Diagnosis present

## 2014-02-10 DIAGNOSIS — E780 Pure hypercholesterolemia: Secondary | ICD-10-CM | POA: Diagnosis present

## 2014-02-10 DIAGNOSIS — F329 Major depressive disorder, single episode, unspecified: Secondary | ICD-10-CM | POA: Diagnosis present

## 2014-02-10 DIAGNOSIS — Z9889 Other specified postprocedural states: Secondary | ICD-10-CM

## 2014-02-10 DIAGNOSIS — I429 Cardiomyopathy, unspecified: Secondary | ICD-10-CM | POA: Diagnosis present

## 2014-02-10 DIAGNOSIS — I428 Other cardiomyopathies: Secondary | ICD-10-CM | POA: Diagnosis not present

## 2014-02-10 DIAGNOSIS — Z4689 Encounter for fitting and adjustment of other specified devices: Secondary | ICD-10-CM | POA: Diagnosis not present

## 2014-02-10 DIAGNOSIS — K219 Gastro-esophageal reflux disease without esophagitis: Secondary | ICD-10-CM | POA: Diagnosis present

## 2014-02-10 DIAGNOSIS — T888XXA Other specified complications of surgical and medical care, not elsewhere classified, initial encounter: Secondary | ICD-10-CM | POA: Diagnosis not present

## 2014-02-10 DIAGNOSIS — G43009 Migraine without aura, not intractable, without status migrainosus: Secondary | ICD-10-CM | POA: Diagnosis present

## 2014-02-10 DIAGNOSIS — K589 Irritable bowel syndrome without diarrhea: Secondary | ICD-10-CM | POA: Diagnosis present

## 2014-02-10 DIAGNOSIS — Z433 Encounter for attention to colostomy: Principal | ICD-10-CM

## 2014-02-10 DIAGNOSIS — B370 Candidal stomatitis: Secondary | ICD-10-CM | POA: Diagnosis not present

## 2014-02-10 DIAGNOSIS — D649 Anemia, unspecified: Secondary | ICD-10-CM | POA: Diagnosis not present

## 2014-02-10 DIAGNOSIS — K5732 Diverticulitis of large intestine without perforation or abscess without bleeding: Secondary | ICD-10-CM | POA: Diagnosis not present

## 2014-02-10 DIAGNOSIS — K5900 Constipation, unspecified: Secondary | ICD-10-CM | POA: Diagnosis not present

## 2014-02-10 DIAGNOSIS — Z823 Family history of stroke: Secondary | ICD-10-CM | POA: Diagnosis not present

## 2014-02-10 DIAGNOSIS — Z801 Family history of malignant neoplasm of trachea, bronchus and lung: Secondary | ICD-10-CM | POA: Diagnosis not present

## 2014-02-10 DIAGNOSIS — K5792 Diverticulitis of intestine, part unspecified, without perforation or abscess without bleeding: Secondary | ICD-10-CM | POA: Diagnosis present

## 2014-02-10 DIAGNOSIS — F419 Anxiety disorder, unspecified: Secondary | ICD-10-CM | POA: Diagnosis present

## 2014-02-10 DIAGNOSIS — I509 Heart failure, unspecified: Secondary | ICD-10-CM | POA: Diagnosis not present

## 2014-02-10 DIAGNOSIS — Z7982 Long term (current) use of aspirin: Secondary | ICD-10-CM | POA: Diagnosis not present

## 2014-02-10 DIAGNOSIS — Z933 Colostomy status: Secondary | ICD-10-CM | POA: Diagnosis not present

## 2014-02-10 HISTORY — PX: COLOSTOMY REVERSAL: SHX5782

## 2014-02-10 LAB — CBC
HCT: 35.2 % — ABNORMAL LOW (ref 36.0–46.0)
Hemoglobin: 11.6 g/dL — ABNORMAL LOW (ref 12.0–15.0)
MCH: 28.2 pg (ref 26.0–34.0)
MCHC: 33 g/dL (ref 30.0–36.0)
MCV: 85.6 fL (ref 78.0–100.0)
Platelets: 271 10*3/uL (ref 150–400)
RBC: 4.11 MIL/uL (ref 3.87–5.11)
RDW: 12.8 % (ref 11.5–15.5)
WBC: 19.8 10*3/uL — ABNORMAL HIGH (ref 4.0–10.5)

## 2014-02-10 LAB — CREATININE, SERUM
Creatinine, Ser: 1.12 mg/dL — ABNORMAL HIGH (ref 0.50–1.10)
GFR calc Af Amer: 57 mL/min — ABNORMAL LOW (ref >90)
GFR calc non Af Amer: 49 mL/min — ABNORMAL LOW (ref >90)

## 2014-02-10 SURGERY — COLOSTOMY REVERSAL
Anesthesia: General | Site: Abdomen

## 2014-02-10 MED ORDER — FENTANYL CITRATE 0.05 MG/ML IJ SOLN
INTRAMUSCULAR | Status: AC
  Filled 2014-02-10: qty 5

## 2014-02-10 MED ORDER — ENOXAPARIN SODIUM 40 MG/0.4ML ~~LOC~~ SOLN
40.0000 mg | SUBCUTANEOUS | Status: DC
  Administered 2014-02-11 – 2014-02-19 (×10): 40 mg via SUBCUTANEOUS
  Filled 2014-02-10 (×14): qty 0.4

## 2014-02-10 MED ORDER — PANTOPRAZOLE SODIUM 40 MG IV SOLR: 40.0000 mg | INTRAVENOUS | Status: DC

## 2014-02-10 MED ORDER — ROCURONIUM BROMIDE 50 MG/5ML IV SOLN
INTRAVENOUS | Status: AC
  Filled 2014-02-10: qty 1

## 2014-02-10 MED ORDER — GLYCOPYRROLATE 0.2 MG/ML IJ SOLN
INTRAMUSCULAR | Status: DC | PRN
  Administered 2014-02-10: 0.6 mg via INTRAVENOUS

## 2014-02-10 MED ORDER — ROCURONIUM BROMIDE 100 MG/10ML IV SOLN
INTRAVENOUS | Status: DC | PRN
  Administered 2014-02-10: 50 mg via INTRAVENOUS

## 2014-02-10 MED ORDER — TRIAMCINOLONE ACETONIDE 55 MCG/ACT NA AERO
2.0000 | INHALATION_SPRAY | Freq: Two times a day (BID) | NASAL | Status: DC
  Administered 2014-02-10 – 2014-02-20 (×17): 2 via NASAL
  Filled 2014-02-10: qty 21.6

## 2014-02-10 MED ORDER — MIDAZOLAM HCL 2 MG/2ML IJ SOLN
INTRAMUSCULAR | Status: AC
  Filled 2014-02-10: qty 2

## 2014-02-10 MED ORDER — FENTANYL CITRATE 0.05 MG/ML IJ SOLN
INTRAMUSCULAR | Status: AC
  Filled 2014-02-10: qty 2

## 2014-02-10 MED ORDER — MIDAZOLAM HCL 2 MG/2ML IJ SOLN
1.0000 mg | Freq: Once | INTRAMUSCULAR | Status: AC
  Administered 2014-02-10: 1 mg via INTRAVENOUS

## 2014-02-10 MED ORDER — MIDAZOLAM HCL 5 MG/5ML IJ SOLN
INTRAMUSCULAR | Status: DC | PRN
  Administered 2014-02-10: 2 mg via INTRAVENOUS

## 2014-02-10 MED ORDER — 0.9 % SODIUM CHLORIDE (POUR BTL) OPTIME
TOPICAL | Status: DC | PRN
  Administered 2014-02-10: 2000 mL

## 2014-02-10 MED ORDER — DEXAMETHASONE SODIUM PHOSPHATE 4 MG/ML IJ SOLN
INTRAMUSCULAR | Status: DC | PRN
  Administered 2014-02-10: 8 mg via INTRAVENOUS

## 2014-02-10 MED ORDER — OMEPRAZOLE 20 MG PO CPDR
40.0000 mg | DELAYED_RELEASE_CAPSULE | Freq: Two times a day (BID) | ORAL | Status: DC
  Administered 2014-02-10 – 2014-02-20 (×19): 40 mg via ORAL
  Filled 2014-02-10 (×26): qty 2

## 2014-02-10 MED ORDER — CARVEDILOL 6.25 MG PO TABS: 6.2500 mg | ORAL_TABLET | Freq: Two times a day (BID) | ORAL | Status: DC

## 2014-02-10 MED ORDER — LIDOCAINE HCL (CARDIAC) 20 MG/ML IV SOLN
INTRAVENOUS | Status: AC
  Filled 2014-02-10: qty 5

## 2014-02-10 MED ORDER — DIPHENHYDRAMINE HCL 12.5 MG/5ML PO ELIX
12.5000 mg | ORAL_SOLUTION | Freq: Four times a day (QID) | ORAL | Status: DC | PRN
  Administered 2014-02-11: 12.5 mg via ORAL
  Filled 2014-02-10: qty 10

## 2014-02-10 MED ORDER — ONDANSETRON HCL 4 MG/2ML IJ SOLN
INTRAMUSCULAR | Status: DC | PRN
  Administered 2014-02-10: 4 mg via INTRAVENOUS

## 2014-02-10 MED ORDER — FENTANYL 10 MCG/ML IV SOLN
INTRAVENOUS | Status: DC
  Administered 2014-02-10: 225 ug via INTRAVENOUS
  Administered 2014-02-10: 240 ug via INTRAVENOUS
  Administered 2014-02-10: 12:00:00 via INTRAVENOUS
  Administered 2014-02-11: 360 ug via INTRAVENOUS
  Administered 2014-02-11 (×2): 15 ug via INTRAVENOUS
  Administered 2014-02-11: 180 ug via INTRAVENOUS
  Administered 2014-02-11: 210 ug via INTRAVENOUS
  Administered 2014-02-11: 16:00:00 via INTRAVENOUS
  Administered 2014-02-11: 210 ug via INTRAVENOUS
  Administered 2014-02-12: 417 ug via INTRAVENOUS
  Administered 2014-02-12: 02:00:00 via INTRAVENOUS
  Administered 2014-02-12: 210 ug via INTRAVENOUS
  Filled 2014-02-10 (×5): qty 50

## 2014-02-10 MED ORDER — CARVEDILOL 12.5 MG PO TABS
6.2500 mg | ORAL_TABLET | Freq: Two times a day (BID) | ORAL | Status: DC
  Administered 2014-02-10 – 2014-02-20 (×18): 6.25 mg via ORAL
  Filled 2014-02-10 (×3): qty 0.5
  Filled 2014-02-10: qty 1
  Filled 2014-02-10 (×14): qty 0.5
  Filled 2014-02-10: qty 1
  Filled 2014-02-10: qty 0.5

## 2014-02-10 MED ORDER — PROPOFOL 10 MG/ML IV BOLUS
INTRAVENOUS | Status: AC
  Filled 2014-02-10: qty 20

## 2014-02-10 MED ORDER — SCOPOLAMINE 1 MG/3DAYS TD PT72
1.0000 | MEDICATED_PATCH | TRANSDERMAL | Status: DC
  Administered 2014-02-10: 1 via TRANSDERMAL

## 2014-02-10 MED ORDER — LACTATED RINGERS IV SOLN
INTRAVENOUS | Status: DC | PRN
  Administered 2014-02-10 (×2): via INTRAVENOUS

## 2014-02-10 MED ORDER — FENTANYL CITRATE 0.05 MG/ML IJ SOLN
100.0000 ug | Freq: Once | INTRAMUSCULAR | Status: AC
  Administered 2014-02-10: 100 ug via INTRAVENOUS

## 2014-02-10 MED ORDER — NEOSTIGMINE METHYLSULFATE 10 MG/10ML IV SOLN
INTRAVENOUS | Status: DC | PRN
  Administered 2014-02-10: 4 mg via INTRAVENOUS

## 2014-02-10 MED ORDER — PROPOFOL 10 MG/ML IV BOLUS
INTRAVENOUS | Status: DC | PRN
  Administered 2014-02-10: 150 mg via INTRAVENOUS
  Administered 2014-02-10: 50 mg via INTRAVENOUS

## 2014-02-10 MED ORDER — GLYCOPYRROLATE 0.2 MG/ML IJ SOLN
INTRAMUSCULAR | Status: AC
  Filled 2014-02-10: qty 3

## 2014-02-10 MED ORDER — WHITE PETROLATUM GEL
Status: AC
  Administered 2014-02-10: 0.2
  Filled 2014-02-10: qty 5

## 2014-02-10 MED ORDER — PROMETHAZINE HCL 25 MG/ML IJ SOLN: 6.2500 mg | INTRAMUSCULAR | Status: DC | PRN

## 2014-02-10 MED ORDER — FENTANYL CITRATE 0.05 MG/ML IJ SOLN
INTRAMUSCULAR | Status: AC
Start: 2014-02-10 — End: 2014-02-10
  Filled 2014-02-10: qty 2

## 2014-02-10 MED ORDER — OFLOXACIN 0.3 % OT SOLN
5.0000 [drp] | Freq: Every day | OTIC | Status: DC
  Administered 2014-02-11 – 2014-02-20 (×8): 5 [drp] via OTIC
  Filled 2014-02-10: qty 5

## 2014-02-10 MED ORDER — FENTANYL CITRATE 0.05 MG/ML IJ SOLN
INTRAMUSCULAR | Status: DC | PRN
  Administered 2014-02-10 (×7): 50 ug via INTRAVENOUS
  Administered 2014-02-10: 100 ug via INTRAVENOUS
  Administered 2014-02-10: 50 ug via INTRAVENOUS

## 2014-02-10 MED ORDER — SPIRONOLACTONE 25 MG PO TABS
25.0000 mg | ORAL_TABLET | Freq: Every morning | ORAL | Status: DC
  Administered 2014-02-10 – 2014-02-20 (×11): 25 mg via ORAL
  Filled 2014-02-10 (×8): qty 1

## 2014-02-10 MED ORDER — POTASSIUM CHLORIDE IN NACL 20-0.9 MEQ/L-% IV SOLN
INTRAVENOUS | Status: DC
  Administered 2014-02-10: 16:00:00 via INTRAVENOUS
  Filled 2014-02-10 (×4): qty 1000

## 2014-02-10 MED ORDER — SCOPOLAMINE 1 MG/3DAYS TD PT72
MEDICATED_PATCH | TRANSDERMAL | Status: AC
  Filled 2014-02-10: qty 1

## 2014-02-10 MED ORDER — ARTIFICIAL TEARS OP OINT
TOPICAL_OINTMENT | OPHTHALMIC | Status: AC
  Filled 2014-02-10: qty 3.5

## 2014-02-10 MED ORDER — CETYLPYRIDINIUM CHLORIDE 0.05 % MT LIQD
7.0000 mL | Freq: Two times a day (BID) | OROMUCOSAL | Status: DC
  Administered 2014-02-11 – 2014-02-13 (×3): 7 mL via OROMUCOSAL

## 2014-02-10 MED ORDER — ALPRAZOLAM 0.25 MG PO TABS
0.1250 mg | ORAL_TABLET | Freq: Two times a day (BID) | ORAL | Status: DC | PRN
  Administered 2014-02-15 – 2014-02-17 (×3): 0.125 mg via ORAL
  Filled 2014-02-10 (×3): qty 1

## 2014-02-10 MED ORDER — LIDOCAINE HCL (CARDIAC) 20 MG/ML IV SOLN
INTRAVENOUS | Status: DC | PRN
  Administered 2014-02-10: 50 mg via INTRAVENOUS

## 2014-02-10 MED ORDER — ONDANSETRON HCL 4 MG/2ML IJ SOLN
INTRAMUSCULAR | Status: AC
  Filled 2014-02-10: qty 2

## 2014-02-10 MED ORDER — DIPHENHYDRAMINE HCL 50 MG/ML IJ SOLN
INTRAMUSCULAR | Status: AC
  Filled 2014-02-10: qty 1

## 2014-02-10 MED ORDER — ONDANSETRON HCL 4 MG/2ML IJ SOLN
4.0000 mg | Freq: Four times a day (QID) | INTRAMUSCULAR | Status: DC | PRN
  Filled 2014-02-10: qty 2

## 2014-02-10 MED ORDER — DIPHENHYDRAMINE HCL 50 MG/ML IJ SOLN
INTRAMUSCULAR | Status: DC | PRN
  Administered 2014-02-10: 12.5 mg via INTRAVENOUS

## 2014-02-10 MED ORDER — DIPHENHYDRAMINE HCL 50 MG/ML IJ SOLN
12.5000 mg | Freq: Four times a day (QID) | INTRAMUSCULAR | Status: DC | PRN
  Filled 2014-02-10: qty 1

## 2014-02-10 MED ORDER — NEOSTIGMINE METHYLSULFATE 10 MG/10ML IV SOLN
INTRAVENOUS | Status: AC
  Filled 2014-02-10: qty 1

## 2014-02-10 MED ORDER — SODIUM CHLORIDE 0.9 % IJ SOLN: 9.0000 mL | INTRAMUSCULAR | Status: DC | PRN

## 2014-02-10 MED ORDER — SPIRONOLACTONE 25 MG PO TABS: 25.0000 mg | ORAL_TABLET | Freq: Every morning | ORAL | Status: DC

## 2014-02-10 MED ORDER — NALOXONE HCL 0.4 MG/ML IJ SOLN: 0.4000 mg | INTRAMUSCULAR | Status: DC | PRN

## 2014-02-10 MED ORDER — TORSEMIDE 10 MG PO TABS: 10.0000 mg | ORAL_TABLET | Freq: Every day | ORAL | Status: DC | PRN

## 2014-02-10 MED ORDER — FENTANYL CITRATE 0.05 MG/ML IJ SOLN
25.0000 ug | INTRAMUSCULAR | Status: DC | PRN
  Administered 2014-02-10: 25 ug via INTRAVENOUS
  Administered 2014-02-10 (×2): 50 ug via INTRAVENOUS
  Administered 2014-02-10: 25 ug via INTRAVENOUS

## 2014-02-10 MED ORDER — DEXAMETHASONE SODIUM PHOSPHATE 4 MG/ML IJ SOLN
INTRAMUSCULAR | Status: AC
  Filled 2014-02-10: qty 2

## 2014-02-10 SURGICAL SUPPLY — 61 items
BINDER ABD UNIV 10 28-50 (GAUZE/BANDAGES/DRESSINGS) ×1 IMPLANT
BINDER ABDOM UNIV 10 (GAUZE/BANDAGES/DRESSINGS) ×2
BLADE SURG ROTATE 9660 (MISCELLANEOUS) IMPLANT
CANISTER SUCTION 2500CC (MISCELLANEOUS) ×2 IMPLANT
CHLORAPREP W/TINT 26ML (MISCELLANEOUS) ×2 IMPLANT
COVER MAYO STAND STRL (DRAPES) ×2 IMPLANT
DRAPE LAPAROSCOPIC ABDOMINAL (DRAPES) ×2 IMPLANT
DRAPE PROXIMA HALF (DRAPES) ×2 IMPLANT
DRAPE UTILITY 15X26 W/TAPE STR (DRAPE) ×10 IMPLANT
DRAPE WARM FLUID 44X44 (DRAPE) ×2 IMPLANT
DRSG OPSITE POSTOP 4X10 (GAUZE/BANDAGES/DRESSINGS) IMPLANT
DRSG OPSITE POSTOP 4X8 (GAUZE/BANDAGES/DRESSINGS) IMPLANT
DRSG PAD ABDOMINAL 8X10 ST (GAUZE/BANDAGES/DRESSINGS) ×4 IMPLANT
ELECT BLADE 6.5 EXT (BLADE) ×2 IMPLANT
ELECT CAUTERY BLADE 6.4 (BLADE) ×4 IMPLANT
ELECT REM PT RETURN 9FT ADLT (ELECTROSURGICAL) ×2
ELECTRODE REM PT RTRN 9FT ADLT (ELECTROSURGICAL) ×1 IMPLANT
GEL ULTRASOUND 20GR AQUASONIC (MISCELLANEOUS) ×2 IMPLANT
GLOVE BIO SURGEON STRL SZ7 (GLOVE) ×1 IMPLANT
GLOVE BIOGEL PI IND STRL 7.0 (GLOVE) ×2 IMPLANT
GLOVE BIOGEL PI IND STRL 7.5 (GLOVE) IMPLANT
GLOVE BIOGEL PI IND STRL 8 (GLOVE) ×2 IMPLANT
GLOVE BIOGEL PI INDICATOR 7.0 (GLOVE) ×2
GLOVE BIOGEL PI INDICATOR 7.5 (GLOVE)
GLOVE BIOGEL PI INDICATOR 8 (GLOVE) ×2
GLOVE SURG SS PI 7.0 STRL IVOR (GLOVE) ×8 IMPLANT
GLOVE SURG SS PI 7.5 STRL IVOR (GLOVE) ×4 IMPLANT
GLOVE SURG SS PI 8.0 STRL IVOR (GLOVE) ×4 IMPLANT
GOWN STRL REUS W/ TWL LRG LVL3 (GOWN DISPOSABLE) ×5 IMPLANT
GOWN STRL REUS W/TWL LRG LVL3 (GOWN DISPOSABLE) ×10
KIT BASIN OR (CUSTOM PROCEDURE TRAY) ×2 IMPLANT
KIT ROOM TURNOVER OR (KITS) ×2 IMPLANT
LIGASURE IMPACT 36 18CM CVD LR (INSTRUMENTS) IMPLANT
NS IRRIG 1000ML POUR BTL (IV SOLUTION) ×4 IMPLANT
PACK GENERAL/GYN (CUSTOM PROCEDURE TRAY) ×2 IMPLANT
PAD ARMBOARD 7.5X6 YLW CONV (MISCELLANEOUS) ×2 IMPLANT
PENCIL BUTTON HOLSTER BLD 10FT (ELECTRODE) ×2 IMPLANT
SPONGE GAUZE 4X4 12PLY STER LF (GAUZE/BANDAGES/DRESSINGS) ×2 IMPLANT
SPONGE LAP 18X18 X RAY DECT (DISPOSABLE) ×2 IMPLANT
STAPLER CIRC CVD 29MM 37CM (STAPLE) ×2 IMPLANT
STAPLER CUT CVD 40MM GREEN (STAPLE) ×1 IMPLANT
STAPLER VISISTAT 35W (STAPLE) ×2 IMPLANT
SUCTION POOLE TIP (SUCTIONS) ×2 IMPLANT
SURGILUBE 2OZ TUBE FLIPTOP (MISCELLANEOUS) IMPLANT
SUT PDS AB 1 CTX 36 (SUTURE) ×2 IMPLANT
SUT PDS AB 1 TP1 96 (SUTURE) IMPLANT
SUT PROLENE 2 0 CT2 30 (SUTURE) ×2 IMPLANT
SUT PROLENE 2 0 KS (SUTURE) IMPLANT
SUT SILK 2 0 SH CR/8 (SUTURE) ×2 IMPLANT
SUT SILK 2 0 TIES 10X30 (SUTURE) ×2 IMPLANT
SUT SILK 3 0 SH CR/8 (SUTURE) ×2 IMPLANT
SUT SILK 3 0 TIES 10X30 (SUTURE) ×2 IMPLANT
TOWEL OR 17X24 6PK STRL BLUE (TOWEL DISPOSABLE) ×2 IMPLANT
TOWEL OR 17X26 10 PK STRL BLUE (TOWEL DISPOSABLE) ×2 IMPLANT
TRAY FOLEY BAG SILVER LF 16FR (CATHETERS) ×2 IMPLANT
TRAY FOLEY CATH 16FRSI W/METER (SET/KITS/TRAYS/PACK) IMPLANT
TRAY PROCTOSCOPIC FIBER OPTIC (SET/KITS/TRAYS/PACK) ×2 IMPLANT
TUBE CONNECTING 12X1/4 (SUCTIONS) ×2 IMPLANT
TUBE CONNECTING 20X1/4 (TUBING) ×2 IMPLANT
WATER STERILE IRR 1000ML POUR (IV SOLUTION) IMPLANT
YANKAUER SUCT BULB TIP NO VENT (SUCTIONS) ×2 IMPLANT

## 2014-02-10 NOTE — Anesthesia Preprocedure Evaluation (Addendum)
Anesthesia Evaluation  Patient identified by MRN, date of birth, ID band Patient awake    Reviewed: Allergy & Precautions, H&P , NPO status , Patient's Chart, lab work & pertinent test results  History of Anesthesia Complications (+) PONV and history of anesthetic complications  Airway Mallampati: II TM Distance: >3 FB Neck ROM: Full    Dental  (+) Teeth Intact, Dental Advisory Given   Pulmonary pneumonia -,    Pulmonary exam normal       Cardiovascular hypertension, Pt. on medications and Pt. on home beta blockers +CHF + pacemaker + Cardiac Defibrillator  Study Conclusions  - Left ventricle: The cavity size was normal. Wall thickness   was normal. Systolic function was normal. The estimated   ejection fraction was in the range of 50% to 55%. Wall   motion was normal; there were no regional wall motion   abnormalities.    Neuro/Psych  Headaches, PSYCHIATRIC DISORDERS Anxiety Depression    GI/Hepatic Neg liver ROS, GERD-  Medicated,  Endo/Other  negative endocrine ROS  Renal/GU Renal InsufficiencyRenal disease     Musculoskeletal  (+) Fibromyalgia -  Abdominal   Peds  Hematology   Anesthesia Other Findings   Reproductive/Obstetrics                         Anesthesia Physical Anesthesia Plan  ASA: III  Anesthesia Plan: General   Post-op Pain Management:    Induction: Intravenous  Airway Management Planned: Oral ETT  Additional Equipment:   Intra-op Plan:   Post-operative Plan: Extubation in OR  Informed Consent: I have reviewed the patients History and Physical, chart, labs and discussed the procedure including the risks, benefits and alternatives for the proposed anesthesia with the patient or authorized representative who has indicated his/her understanding and acceptance.   Dental advisory given  Plan Discussed with: CRNA, Anesthesiologist and Surgeon  Anesthesia Plan  Comments:         Anesthesia Quick Evaluation

## 2014-02-10 NOTE — Progress Notes (Signed)
This note also relates to the following rows which could not be included: Pulse Rate - Cannot attach notes to unvalidated device data ECG Heart Rate - Cannot attach notes to unvalidated device data Resp - Cannot attach notes to unvalidated device data BP - Cannot attach notes to unvalidated device data SpO2 - Cannot attach notes to unvalidated device data End Tidal CO2 (EtCO2) - Cannot attach notes to unvalidated device data   Dr Linna Caprice administered 63mcg of precedex in conjunction w/ 1 mg versed

## 2014-02-10 NOTE — Progress Notes (Signed)
Pt's husband brought pt's home meds to the bedside.  Instructed them about our policy and locking home meds in the pharmacy vs sending them home.  Pt states that she CANNOT take the hospital supply of medicine, she has so many allergies.  She has had difficulty switching between brands in the past.  Home meds counted with husband and taken to pharmacy to be dispensed for pt use while here.

## 2014-02-10 NOTE — Progress Notes (Signed)
Patient arrived to room 6N27 via hospital bed from the PACU.  VSS.  Minimal drainage noted on abd dressing.  ABD binder replaced.  Pt reports pain 9/10.  Pt given PCA button and instructed on use.  Oriented to callbell, room, and dept 6N.  Husband at bedside.  Will cont to monitor.

## 2014-02-10 NOTE — Anesthesia Postprocedure Evaluation (Signed)
  Anesthesia Post-op Note  Patient: Ashley [name]  Procedure(s) Performed: Procedure(s): COLOSTOMY REVERSAL (N/A)  Patient Location: PACU  Anesthesia Type:General  Level of Consciousness: awake, alert  and oriented  Airway and Oxygen Therapy: Patient Spontanous Breathing and Patient connected to nasal cannula oxygen  Post-op Pain: mild  Post-op Assessment: Post-op Vital signs reviewed, Patient's Cardiovascular Status Stable, Respiratory Function Stable, Patent Airway, No signs of Nausea or vomiting and Pain level controlled  Post-op Vital Signs: stable  Last Vitals:  Filed Vitals:   02/10/14 1254  BP: 112/50  Pulse: 69  Temp: 36.6 C  Resp: 14    Complications: No apparent anesthesia complications

## 2014-02-10 NOTE — Op Note (Signed)
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 Assistant: Dr. Jackolyn Confer Anesthesia: Gen. Endotracheal Indications: This is a 68 year old female who is several months status post sigmoid colectomy for perforated diverticulitis with an abscess. She underwent a Hartman's procedure with a descending colostomy. She presents now for colostomy reversal.  Description of procedure: The patient brought to the operating room and placed in a supine position on the operating room table. After an adequate level of general anesthesia was obtained, a latex free Foley catheter was placed under sterile technique. Her legs were placed in lithotomy position in yellowfin stirrups. The skin of her abdomen was prepped with ChloraPrep and her perineum was prepped with CHD. She has very sensitive skin and we will wash all the soft at the end of the case.  A timeout was taken to ensure the proper patient and proper procedure. We made a vertical midline incision through her previous incision. Dissection was carried down to the fascia. We entered the peritoneal cavity sharply. The patient has some flimsy adhesions to the anterior abdominal wall. These were taken down bluntly. The Bookwalter retractor was then placed to provide exposure. We then spent the next 90 minutes performing lysis of adhesions. The small bowel is adherent down into the pelvis. Using a combination of blunt and sharp dissection, we were able to mobilize all the small bowel out of the pelvis. We were able to identify the rectal stump by the Prolene sutures. We dissected completely around the left lower quadrant descending colostomy. The rectal stump was grasped with a Babcock clamp and we mobilized the proximal end of the stump. There seems to be about 10 cm of  distal stump above the peritoneal reflection. We irrigated the abdomen thoroughly and inspected for hemostasis. The colostomy was then taken down by incising the skin with cautery. We mobilized the descending colon away from the subcutaneous tissues and released from the fascia. We brought the descending colon into the abdomen. Prior to surgery, the colostomy had been closed with a 2-0 silk pursestring suture. The end of the colon was then excised with cautery. An EEA sizers were used to determine the size of the colon. We selected a 29 mm EEA stapler. A pursestring suture of 2-0 Vicryl is placed in the distal end of the descending colon. The 29 mm anvil was then inserted and the pursestring suture was tied around the anvil.    Then went below and performed a rectal examination. Some mucus was evacuated from the rectal vault. I passed the 29 mm EEA sizer into the rectal stump. This seems to be some scarring at the proximal end of the rectal stump that did not allow the sizer passed all the way to the staple line. We attempted to visually examine this area with the rigid sigmoidoscope but we're unable to get past the stricture. Since we had enough length of rectal stump we decided to resect some more rectal stump. After changing close, we mobilized the rectal stump and resected another 4 cm with a green contour stapler. I then went below and passed the EEA stapler easily to the end of the rectal stump at the staple line. The spike was advanced anterior to the staple line. This was connected to the anvil of the descending colon. We made sure that the orientation was correct. The stapler was closed and then fired. It was held in  place for 30 seconds. We then removed the stapler and we inspected the 2 tissue doughnuts. Both donuts are intact. We clamped the colon proximally. The pelvis was filled with saline. We insufflated air into the rectum with the rigid sigmoidoscope. No air bubbles were noted. No bleeding was  noted internally. The saline was evacuated from the pelvis. We irrigated thoroughly and suctioned as much irrigation as possible. Following the: Protocol we changed gown and gloves were placed all the instruments. We inspected again for hemostasis. The anastomosis does not seem to be under any tension. The fascia was reapproximated with double-stranded #1 PDS suture. The fascia of the colostomy site was closed with a single stranded #1 PDS suture. The skin of the midline incision was closed with staples. The skin of the colostomy site was left open and the subcutaneous tissues were packed with 4 x 4 gauze. A dry dressing was applied. We will hold is in place with the abdominal binder. All the prep solution was washed off of her skin. The patient was then expanded and brought to recovery in stable condition. All sponge, initially, and needle counts are correct.  Imogene Burn. Georgette Dover, MD, [facility] Surgery  General/ Trauma Surgery  02/10/2014 10:52 AM

## 2014-02-10 NOTE — Progress Notes (Signed)
Patient refused Sacral Dressing

## 2014-02-10 NOTE — H&P (Signed)
Patient ID: Ashley [name], female DOB: Nov 10, 1945, 68 y.o. MRN: [id number]  Chief Complaint   Patient presents with   .  Follow-up   HPI  Ashley [name] is a 68 y.o. female.  HPI  This is a 68 year old female who was initially treated in [place] In early May of 2015 for perforated diverticulitis. She underwent laparoscopic irrigation and drain placement for perforated diverticulitis. A percutaneous drain was then placed into an abscess. She was discharged home after drain removal on 09/26/13. She was then admitted to [facility] on 5/19 with elevated white count and abdominal pain. She had a persistent abscess I was unable to be drained by interventional radiology. Contrast leak from her colon and into the abscess. She underwent Hartman's procedure On 10/02/13. Her diet was slowly advanced and she received ostomy education. She was discharged home On 10/10/13. She developed a superficial skin infection and the wound was opened. The wound has been dressed by home health and is not completely healed. 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 She presents now to discuss possible colostomy reversal.  Past Medical History   Diagnosis  Date   .  Migraine without aura, without mention of intractable migraine without mention of status migrainosus    .  History of diverticulitis of colon    .  Pure hypercholesterolemia    .  Irritable bowel syndrome    .  Laryngeal nodule      Dr. Thelma Comp   .  Fibrocystic breast disease    .  Osteoarthritis    .  Hemorrhoids    .  Anxiety    .  GERD (gastroesophageal reflux disease)    .  Systolic CHF      EF 25-35%, LV dilation, significant MR, goal weight 150lbs   .  Nonischemic cardiomyopathy    .  History of pneumonia  2012   .  Mitral regurgitation    .  Anxiety    .  Bronchiectasis      possibly mild, treat  URIs aggressively   .  Hypertension    .  Heart murmur    .  Angina    .  Lung disease  07/14/11   .  Sleep apnea    .  Shortness of breath      "resting"   .  Anemia    .  Fibromyalgia    .  Depression    .  Disorder of vocal cords    .  Chronic sinusitis    .  Insomnia    .  Osteoporosis  01/2013   .  sigmoid diverticulitis with perforation, abscess and fistula  09/30/2013   .  H/O multiple allergies  10/10/2013    Past Surgical History   Procedure  Laterality  Date   .  Appendectomy     .  Mandible surgery       "got about 6 pins in the bottom of my jaw"   .  Knee arthroscopy w/ orif       Left; "meniscus tear"   .  Osteotomy       Left foot   .  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  Dexa   03/28/99     osteopenia L/S   .  Colonoscopy  1998     nml (sigmoid-Gilbert-nol)   .  Head mri, head ct   10/1998     ? H/A focus ([place])   .  Emg/mcv   10/16/01     + mild carpal tunnel   .  Dexa   03/12/03     2.2 Spine 0.1 Hip Osteopenia   .  Mri right hip   12/12/01     Tendonitis Gluteus Medius to Gtr Trochanter Neg Bursitis (Dr. Montez Morita)   .  L/s films   11/21/01     nml; Right hip nml   .  Mri u/s   11/21/01     min. DDD L/3-4   .  Colonoscopy   01/04/04     polyps, diverticulosis/severe   .  Dexa   11/14/05     1.9 Spine 0.3 Hip slight improvement   .  Flex laryngoscopy   06/11/06     (Juengel) nml   .  Dexa   11/22/06     improved osteo and fem neck   .  Chevron bunionectomy   11/04/08     Right Great Toe (Dr. Lestine Box)   .  Breast mass excision   01/2010     Left-fibrocystic change w/intraductal papilloma, no malignancy   .  Ct chest   12/2010     possible bronchiectasis lower lobes, 02/2011 - enlarged bilateral effusions, bibasilar atx   .  Stress myoview   02/2011     no significant ischemia   .  Ct chest   04/2011     no PE. abnormal R hilar and mediastinal adenopathy --> on rpt 05/2011, resolved   .  US echocardiography   04/2011     mildly dilated LV, EF 25-30%,  hypokinesis, restrictive physiology, mod MR, no AS   .  Icd placement   07/14/11     w/pacemaker   .  Tonsillectomy and adenoidectomy     .  Breast biopsy       "I've had 2-3 bx; all benign"   .  Augmentation mammaplasty     .  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  Abdominal hysterectomy   1987     w/BSO   .  Lasik       left eye   .  Knee surgery       left   .  Mandible surgery     .  Sinus surgery       x3 with balloon   .  Dexa   01/2013     T score -2.6 at spine   .  Partial colectomy  N/A  10/02/2013     Procedure: PARTIAL COLECTOMY; Surgeon: Wilmon Arms. [name], MD; Location: [facility] OR; Service: General; Laterality: N/A;   .  Colostomy  N/A  10/02/2013     Procedure: DESCENDING COLOSTOMY; Surgeon: Wilmon Arms. [name], MD; Location: [facility] OR; Service: General; Laterality: N/A;    Family History   Problem  Relation  Age of Onset   .  Lung cancer  Father      + smoker   .  Dementia  Mother    .  Osteoporosis  Mother      Lumbar spine   .  Stroke  Mother      x 5 @ 32 YOA   .  Arthritis  Mother      hands   .  Emphysema  Mother    .  Alcohol abuse  Paternal Grandfather    .  Hypertension  Maternal Grandfather      ?   .  Stroke  Paternal Grandfather      ?   Marland Kitchen  Heart disease       grandmother   .  Heart disease       grandfather   Social History  History   Substance Use Topics   .  Smoking status:  Never Smoker   .  Smokeless tobacco:  Never Used      Comment: Lived with smokers x 23 yrs, smoked herself "for a week"   .  Alcohol Use:  No    Allergies   Allergen  Reactions   .  Antihistamines, Chlorpheniramine-Type  Other (See Comments)     Makes her eyes look like she sees strobe lights   .  Influenza Vac Split [Flu Virus Vaccine]  Other (See Comments)     "allergic to concentrated eggs that are in vaccine"   .  Penicillins      REACTION: "told 50 years ago I couldn't take it; might be immune to it", able to take amoxicillin    .  Sulfasalazine  Other (See Comments)     "makes my whole body smell like sulfa & makes me sick just smelling it"   .  Atorvastatin  Other (See Comments)     Severe muscle cramps to generic atorvastatin.  Able to take brand lipitor.   .  Latex  Nausea Only and Rash   .  Rosuvastatin  Other (See Comments)     Was not effective controlling lipids   .  Chlorhexidine  Other (See Comments)     blisters   .  Codeine  Other (See Comments)     Brain not clear   .  Cyclobenzaprine  Other (See Comments)     Adverse reaction - back and throat pain, dizziness, exhaustion   .  Cymbalta [Duloxetine Hcl]  Other (See Comments)     "body spasms and made me feel weird in my head"   .  Metaxalone    .  Prevacid [Lansoprazole]  Other (See Comments)     Worsened GI side effects   .  Red Dye      Nausea, swelling   .  Tape      All adhesives    Current Outpatient Prescriptions   Medication  Sig  Dispense  Refill   .  acetaminophen (TYLENOL) 325 MG tablet  Take 1-2 tablets (325-650 mg total) by mouth every 6 (six) hours as needed for mild pain (or Fever >/= 101).     Marland Kitchen  alendronate (FOSAMAX) 70 MG tablet  Take 70 mg by mouth once a week. Take with a full glass of water on an empty stomach.     .  ALPRAZolam (XANAX) 0.25 MG tablet  Take 1 tablet (0.25 mg total) by mouth 2 (two) times daily as needed for anxiety.     Marland Kitchen  aspirin EC 81 MG tablet  Take 1 tablet (81 mg total) by mouth daily.     .  carvedilol (COREG) 12.5 MG tablet  Take 6.25 mg by mouth 2 (two) times daily with a meal.     .  clobetasol cream (TEMOVATE) 0.05 %  Apply 1 application topically 2 (two) times daily. Apply to AA  30 g  0   .  dexlansoprazole (DEXILANT) 60  MG capsule  Take 1 capsule (60 mg total) by mouth daily.  30 capsule  3   .  ferrous fumarate (HEMOCYTE - 106 MG FE) 325 (106 FE) MG TABS tablet  Take 1 tablet by mouth.     .  mometasone (ELOCON) 0.1 % lotion      .  ofloxacin (FLOXIN) 0.3 % otic solution      .  potassium  chloride (K-DUR) 10 MEQ tablet  Take 1 tablet (10 mEq total) by mouth daily.     .  pravastatin (PRAVACHOL) 20 MG tablet  Take 1 tablet (20 mg total) by mouth daily.     Marland Kitchen  spironolactone (ALDACTONE) 25 MG tablet  TAKE 25 mg TABLET BY MOUTH DAILY.     Marland Kitchen  torsemide (DEMADEX) 5 MG tablet  Take 1 tablet (5 mg total) by mouth daily.     .  [DISCONTINUED] bisoprolol (ZEBETA) 5 MG tablet  Take 5 mg by mouth daily.      Current Facility-Administered Medications   Medication  Dose  Route  Frequency  Provider  Last Rate  Last Dose   .  clindamycin (CLEOCIN) 900 mg in dextrose 5 % 50 mL IVPB  900 mg  Intravenous  60 min Pre-Op  Wilmon Arms. Prajna Vanderpool, MD      And   .  gentamicin (GARAMYCIN) 350 mg in dextrose 5 % 50 mL IVPB  5 mg/kg  Intravenous  60 min Pre-Op  Wilmon Arms. Oluwadamilola Rosamond, MD     Review of Systems  Review of Systems  Constitutional: Negative for fever, chills and unexpected weight change.  HENT: Negative for congestion, hearing loss, sore throat, trouble swallowing and voice change.  Eyes: Negative for visual disturbance.  Respiratory: Negative for cough and wheezing.  Cardiovascular: Negative for chest pain, palpitations and leg swelling.  Gastrointestinal: Positive for abdominal pain and constipation. Negative for nausea, vomiting, diarrhea, blood in stool, abdominal distention and anal bleeding.  Genitourinary: Negative for hematuria, vaginal bleeding and difficulty urinating.  Musculoskeletal: Negative for arthralgias.  Skin: Negative for rash and wound.  Neurological: Negative for seizures, syncope and headaches.  Hematological: Negative for adenopathy. Does not bruise/bleed easily.  Psychiatric/Behavioral: Negative for confusion.  Blood pressure 100/60, pulse 95, temperature 96.8 F (36 C), height 5\' 5"  (1.651 m), weight 156 lb (70.761 kg).  Physical Exam  Physical Exam  WDWN in NAD  HEENT: EOMI, sclera anicteric  Neck: No masses, no thyromegaly  Lungs: CTA bilaterally; normal respiratory  effort  CV: Regular rate and rhythm; no murmurs  Abd: +bowel sounds, soft, healed midline incision; pink LLQ ostomy with firm brown stool in bag  Ext: Well-perfused; no edema  Skin: Warm, dry; no sign of jaundice  Data Reviewed  none  Assessment  Descending colostomy after perforated sigmoid diverticulitis 10/02/13.  Plan  Cardiac clearance by Dr. Lewayne Bunting - last visit 6/15.  Plan colostomy reversal ( possible loop ileostomy) in late September or early October. The surgical procedure has been discussed with the patient. Potential risks, benefits, alternative treatments, and expected outcomes have been explained. All of the patient's questions at this time have been answered. The likelihood of reaching the patient's treatment goal is good. The patient understand the proposed surgical procedure and wishes to proceed.    Wilmon Arms. [name], MD, [facility] Surgery  General/ Trauma Surgery  02/10/2014 7:25 AM

## 2014-02-10 NOTE — Transfer of Care (Signed)
Immediate Anesthesia Transfer of Care Note  Patient: Ashley [name]  Procedure(s) Performed: Procedure(s): COLOSTOMY REVERSAL (N/A)  Patient Location: PACU  Anesthesia Type:General  Level of Consciousness: awake, easily aroused  Airway & Oxygen Therapy: Patient Spontanous Breathing and Patient connected to nasal cannula oxygen  Post-op Assessment: Report given to PACU RN and Post -op Vital signs reviewed and stable  Post vital signs: Reviewed and stable  Complications: No apparent anesthesia complications

## 2014-02-11 ENCOUNTER — Encounter (HOSPITAL_COMMUNITY): Payer: Self-pay | Admitting: Surgery

## 2014-02-11 ENCOUNTER — Encounter: Payer: Self-pay | Admitting: Family Medicine

## 2014-02-11 LAB — BASIC METABOLIC PANEL
Anion gap: 14 (ref 5–15)
BUN: 19 mg/dL (ref 6–23)
CO2: 23 mEq/L (ref 19–32)
Calcium: 9.1 mg/dL (ref 8.4–10.5)
Chloride: 103 mEq/L (ref 96–112)
Creatinine, Ser: 1.04 mg/dL (ref 0.50–1.10)
GFR calc Af Amer: 63 mL/min — ABNORMAL LOW (ref >90)
GFR calc non Af Amer: 54 mL/min — ABNORMAL LOW (ref >90)
Glucose, Bld: 133 mg/dL — ABNORMAL HIGH (ref 70–99)
Potassium: 5.1 mEq/L (ref 3.7–5.3)
Sodium: 140 mEq/L (ref 137–147)

## 2014-02-11 LAB — CBC
HCT: 32.8 % — ABNORMAL LOW (ref 36.0–46.0)
Hemoglobin: 10.7 g/dL — ABNORMAL LOW (ref 12.0–15.0)
MCH: 28.3 pg (ref 26.0–34.0)
MCHC: 32.6 g/dL (ref 30.0–36.0)
MCV: 86.8 fL (ref 78.0–100.0)
Platelets: 273 10*3/uL (ref 150–400)
RBC: 3.78 MIL/uL — ABNORMAL LOW (ref 3.87–5.11)
RDW: 12.9 % (ref 11.5–15.5)
WBC: 19.3 10*3/uL — ABNORMAL HIGH (ref 4.0–10.5)

## 2014-02-11 MED ORDER — SODIUM CHLORIDE 0.9 % IV SOLN
INTRAVENOUS | Status: DC
  Administered 2014-02-11: 09:00:00 via INTRAVENOUS

## 2014-02-11 NOTE — Progress Notes (Signed)
1 Day Post-Op  Subjective: The patient is relatively comfortable with Fentanyl PCA Seems to be in good spirits She is very particular about her care.  The tubing from the PCA nasal cannula is irritating her skin. She asks that nobody use hand sanitizer before touching her.  She states that she cannot take Toradol.  She insists on using her home meds instead of hospital pharmacy supply.  Objective: Vital signs in last 24 hours: Temp:  [97.5 F (36.4 C)-98.4 F (36.9 C)] 98.2 F (36.8 C) (09/30 0607) Pulse Rate:  [64-76] 74 (09/30 0750) Resp:  [3-32] 18 (09/30 0607) BP: (92-129)/(41-74) 115/48 mmHg (09/30 0750) SpO2:  [91 %-100 %] 98 % (09/30 0607) Last BM Date: 02/10/14  Intake/Output from previous day: 09/29 0701 - 09/30 0700 In: 1740 [P.O.:240; I.V.:1500] Out: 1320 [Urine:1270; Blood:50] Intake/Output this shift:    General appearance: alert, cooperative and no distress Resp: clear to auscultation bilaterally Cardio: regular rate and rhythm, S1, S2 normal, no murmur, click, rub or gallop GI: mildly distended; minimal bowel sounds Incision - some bruising around lower end; staple line intact  Lab Results:   Recent Labs  02/10/14 1330 02/11/14 0410  WBC 19.8* 19.3*  HGB 11.6* 10.7*  HCT 35.2* 32.8*  PLT 271 273   BMET  Recent Labs  02/10/14 1330 02/11/14 0410  NA  --  140  K  --  5.1  CL  --  103  CO2  --  23  GLUCOSE  --  133*  BUN  --  19  CREATININE 1.12* 1.04  CALCIUM  --  9.1   PT/INR No results found for this basename: LABPROT, INR,  in the last 72 hours ABG No results found for this basename: PHART, PCO2, PO2, HCO3,  in the last 72 hours  Studies/Results: No results found.  Anti-infectives: Anti-infectives   Start     Dose/Rate Route Frequency Ordered Stop   02/09/14 1414  clindamycin (CLEOCIN) IVPB 900 mg     900 mg 100 mL/hr over 30 Minutes Intravenous 60 min pre-op 02/09/14 1414 02/10/14 0730   02/09/14 1414  gentamicin (GARAMYCIN) 350  mg in dextrose 5 % 100 mL IVPB     5 mg/kg  70.8 kg 108.8 mL/hr over 60 Minutes Intravenous 60 min pre-op 02/09/14 1414 02/10/14 0815      Assessment/Plan: s/p Procedure(s): COLOSTOMY REVERSAL (N/A) Awaiting bowel function Clear liquids KVO IVF Encourage ambulation/ incentive spirometer Continue Fentanyl PCA Foley removed   Patient requests oxycodone without tylenol when she switches to PO meds.   LOS: 1 day    Alantra Popoca K. 02/11/2014

## 2014-02-11 NOTE — Progress Notes (Signed)
RN called into patient's room. Upon arrival, patient wanted to take off her abdominal binder stating ", This binder has rubber in it and it is irritating my skin because I am allergic and causing pain in my ribs." Abdominal binder removed from patient. Patient put on home undergarments to hold abdominal dressing in place.

## 2014-02-12 LAB — CBC
HCT: 29.7 % — ABNORMAL LOW (ref 36.0–46.0)
Hemoglobin: 9.6 g/dL — ABNORMAL LOW (ref 12.0–15.0)
MCH: 28.3 pg (ref 26.0–34.0)
MCHC: 32.3 g/dL (ref 30.0–36.0)
MCV: 87.6 fL (ref 78.0–100.0)
Platelets: 191 10*3/uL (ref 150–400)
RBC: 3.39 MIL/uL — ABNORMAL LOW (ref 3.87–5.11)
RDW: 13.3 % (ref 11.5–15.5)
WBC: 19.1 10*3/uL — ABNORMAL HIGH (ref 4.0–10.5)

## 2014-02-12 LAB — BASIC METABOLIC PANEL
Anion gap: 15 (ref 5–15)
BUN: 16 mg/dL (ref 6–23)
CO2: 23 mEq/L (ref 19–32)
Calcium: 9.2 mg/dL (ref 8.4–10.5)
Chloride: 100 mEq/L (ref 96–112)
Creatinine, Ser: 0.99 mg/dL (ref 0.50–1.10)
GFR calc Af Amer: 66 mL/min — ABNORMAL LOW (ref >90)
GFR calc non Af Amer: 57 mL/min — ABNORMAL LOW (ref >90)
Glucose, Bld: 105 mg/dL — ABNORMAL HIGH (ref 70–99)
Potassium: 4.2 mEq/L (ref 3.7–5.3)
Sodium: 138 mEq/L (ref 137–147)

## 2014-02-12 MED ORDER — FENTANYL CITRATE 0.05 MG/ML IJ SOLN
50.0000 ug | INTRAMUSCULAR | Status: DC | PRN
  Administered 2014-02-16 – 2014-02-18 (×5): 50 ug via INTRAVENOUS
  Filled 2014-02-12 (×6): qty 2

## 2014-02-12 MED ORDER — OXYCODONE HCL 5 MG PO TABS
10.0000 mg | ORAL_TABLET | Freq: Four times a day (QID) | ORAL | Status: DC | PRN
  Administered 2014-02-12 (×3): 10 mg via ORAL
  Filled 2014-02-12 (×4): qty 2

## 2014-02-12 NOTE — Progress Notes (Signed)
2 Days Post-Op  Subjective: Patient seems to be doing well - passing much flatus since last night Small smear of bowel movement Slight irritation with urination - s/p Foley Pain well-controlled  Objective: Vital signs in last 24 hours: Temp:  [98 F (36.7 C)-98.4 F (36.9 C)] 98.1 F (36.7 C) (10/01 0635) Pulse Rate:  [71-89] 79 (10/01 0635) Resp:  [17-96] 21 (10/01 0800) BP: (93-108)/(34-49) 102/37 mmHg (10/01 0635) SpO2:  [17 %-97 %] 92 % (10/01 0800) Last BM Date: 02/10/14  Intake/Output from previous day: 09/30 0701 - 10/01 0700 In: 1122.3 [P.O.:1020; I.V.:102.3] Out: 1150 [Urine:1150] Intake/Output this shift: Total I/O In: -  Out: 300 [Urine:300]  General appearance: alert, cooperative and no distress Resp: clear to auscultation bilaterally Cardio: regular rate and rhythm, S1, S2 normal, no murmur, click, rub or gallop GI: + bowel sounds; less distended; skin with no signs of irritation Staple line intact/ dry; LLQ wound - clean  Lab Results:   Recent Labs  02/11/14 0410 02/12/14 0527  WBC 19.3* 19.1*  HGB 10.7* 9.6*  HCT 32.8* 29.7*  PLT 273 191   BMET  Recent Labs  02/11/14 0410 02/12/14 0527  NA 140 138  K 5.1 4.2  CL 103 100  CO2 23 23  GLUCOSE 133* 105*  BUN 19 16  CREATININE 1.04 0.99  CALCIUM 9.1 9.2   PT/INR No results found for this basename: LABPROT, INR,  in the last 72 hours ABG No results found for this basename: PHART, PCO2, PO2, HCO3,  in the last 72 hours  Studies/Results: No results found.  Anti-infectives: Anti-infectives   Start     Dose/Rate Route Frequency Ordered Stop   02/09/14 1414  clindamycin (CLEOCIN) IVPB 900 mg     900 mg 100 mL/hr over 30 Minutes Intravenous 60 min pre-op 02/09/14 1414 02/10/14 0730   02/09/14 1414  gentamicin (GARAMYCIN) 350 mg in dextrose 5 % 100 mL IVPB     5 mg/kg  70.8 kg 108.8 mL/hr over 60 Minutes Intravenous 60 min pre-op 02/09/14 1414 02/10/14 0815      Assessment/Plan: s/p  Procedure(s): COLOSTOMY REVERSAL (N/A) Advance diet Arrange home health to assist with daily wet to dry dressing changes to LLQ wound Ambulate D/C PCA Oxycodone for pain   LOS: 2 days    Ashley [name] K. 02/12/2014

## 2014-02-12 NOTE — Care Management Note (Signed)
  Page 2 of 2   02/20/2014     11:03:46 AM CARE MANAGEMENT NOTE 02/20/2014  Patient:  Ashley [name], Ashley [name]   Account Number:  [id number]  Date Initiated:  02/12/2014  Documentation initiated by:  Magdalen Spatz  Subjective/Objective Assessment:     Action/Plan:   Anticipated DC Date:  02/19/2014   Anticipated DC Plan:  [place]         Choice offered to / List presented to:  [name]        [place] arranged  HH-1 RN      [place]   Status of service:  Completed, signed off Medicare Important Message given?  YES (If response is "NO", the following Medicare IM given date fields will be blank) Date Medicare IM given:  02/20/2014 Medicare IM given by:  Magdalen Spatz Date Additional Medicare IM given:  02/18/2014 Additional Medicare IM given by:  Magdalen Spatz  Discharge Disposition:    Per UR Regulation:    If discussed at Long Length of Stay Meetings, dates discussed:   02/17/2014  02/19/2014    Comments:  02-20-14 Malachy Mood with [facility] aware patient is discharging today . Patient's discharge summary faxed to [facility] at [phone].  Magdalen Spatz RN SBN [phone]     02-12-14 Patient  requesting Leda Quail for home health RN from [facility]  , gave referreal and request to [facility] . Cyndee Brightly L is off work for 1 week. Patient will have a different [place] for first week than after that she will have [place]. Patient and husband aware and understand .  Referral faxed to [facility] , confirmed received.  [facility] phone number [phone] , fax [place] [phone]

## 2014-02-13 LAB — URINALYSIS, ROUTINE W REFLEX MICROSCOPIC
Bilirubin Urine: NEGATIVE
Glucose, UA: NEGATIVE mg/dL
Ketones, ur: 15 mg/dL — AB
Nitrite: POSITIVE — AB
Protein, ur: 30 mg/dL — AB
Specific Gravity, Urine: 1.009 (ref 1.005–1.030)
Urobilinogen, UA: 0.2 mg/dL (ref 0.0–1.0)
pH: 7.5 (ref 5.0–8.0)

## 2014-02-13 LAB — URINE MICROSCOPIC-ADD ON

## 2014-02-13 MED ORDER — OXYCODONE HCL 5 MG PO TABS
5.0000 mg | ORAL_TABLET | ORAL | Status: DC | PRN
  Administered 2014-02-13 – 2014-02-17 (×23): 5 mg via ORAL
  Filled 2014-02-13 (×24): qty 1

## 2014-02-13 MED ORDER — SCOPOLAMINE 1 MG/3DAYS TD PT72
1.0000 | MEDICATED_PATCH | TRANSDERMAL | Status: DC
  Administered 2014-02-13 – 2014-02-19 (×3): 1.5 mg via TRANSDERMAL
  Filled 2014-02-13 (×3): qty 1

## 2014-02-13 MED ORDER — ACETAMINOPHEN 325 MG PO TABS
650.0000 mg | ORAL_TABLET | ORAL | Status: DC | PRN
  Administered 2014-02-13 – 2014-02-19 (×7): 650 mg via ORAL
  Filled 2014-02-13 (×7): qty 2

## 2014-02-13 NOTE — Progress Notes (Signed)
3 Days Post-Op  Subjective: Patient reports multiple episodes of flatus, no BM Tolerating full liquids - wants soft diet Has some complaints about the medications - she wants to use her home meds "because they are adjusted for my body chemistry".  They are logged in at pharmacy and should be dispensed as she takes them at home.  Objective: Vital signs in last 24 hours: Temp:  [98 F (36.7 C)-98.9 F (37.2 C)] 98.8 F (37.1 C) (10/02 0732) Pulse Rate:  [83-94] 94 (10/02 0732) Resp:  [16-20] 16 (10/02 0732) BP: (96-118)/(47-82) 96/82 mmHg (10/02 0732) SpO2:  [93 %-96 %] 94 % (10/02 0732) Last BM Date: 02/10/14  Intake/Output from previous day: 10/01 0701 - 10/02 0700 In: -  Out: 2100 [Urine:2100] Intake/Output this shift:    General appearance: alert, cooperative and no distress Resp: clear to auscultation bilaterally Cardio: regular rate and rhythm, S1, S2 normal, no murmur, click, rub or gallop GI: soft, minimal incisional tenderness; muscle soreness at her flanks, intermittently Incision - intact, minimal drainage; LLQ wound - clean, minimal drainage  Lab Results:   Recent Labs  02/11/14 0410 02/12/14 0527  WBC 19.3* 19.1*  HGB 10.7* 9.6*  HCT 32.8* 29.7*  PLT 273 191   BMET  Recent Labs  02/11/14 0410 02/12/14 0527  NA 140 138  K 5.1 4.2  CL 103 100  CO2 23 23  GLUCOSE 133* 105*  BUN 19 16  CREATININE 1.04 0.99  CALCIUM 9.1 9.2   PT/INR No results found for this basename: LABPROT, INR,  in the last 72 hours ABG No results found for this basename: PHART, PCO2, PO2, HCO3,  in the last 72 hours  Studies/Results: No results found.  Anti-infectives: Anti-infectives   Start     Dose/Rate Route Frequency Ordered Stop   02/09/14 1414  clindamycin (CLEOCIN) IVPB 900 mg     900 mg 100 mL/hr over 30 Minutes Intravenous 60 min pre-op 02/09/14 1414 02/10/14 0730   02/09/14 1414  gentamicin (GARAMYCIN) 350 mg in dextrose 5 % 100 mL IVPB     5 mg/kg  70.8  kg 108.8 mL/hr over 60 Minutes Intravenous 60 min pre-op 02/09/14 1414 02/10/14 0815      Assessment/Plan: s/p Procedure(s): COLOSTOMY REVERSAL (N/A) Advance diet Plan for discharge tomorrow if she has bowel movement today. Will speak with nurse about home meds   LOS: 3 days    Lindia Garms K. 02/13/2014

## 2014-02-14 MED ORDER — POLYETHYLENE GLYCOL 3350 17 G PO PACK
17.0000 g | PACK | Freq: Every day | ORAL | Status: DC
  Administered 2014-02-17: 17 g via ORAL
  Filled 2014-02-14 (×4): qty 1

## 2014-02-14 NOTE — Progress Notes (Signed)
4 Days Post-Op  Subjective: No BM yet Tolerating diet   Objective: Vital signs in last 24 hours: Temp:  [98.3 F (36.8 C)-98.9 F (37.2 C)] 98.5 F (36.9 C) (10/03 0611) Pulse Rate:  [86-102] 87 (10/03 0611) Resp:  [16-20] 16 (10/03 0611) BP: (91-118)/(37-60) 118/51 mmHg (10/03 0611) SpO2:  [94 %-98 %] 98 % (10/03 0611) Last BM Date:  (no BM post op yet)  Intake/Output from previous day: 10/02 0701 - 10/03 0700 In: 600 [P.O.:600] Out: 1600 [Urine:1600] Intake/Output this shift: Total I/O In: -  Out: 300 [Urine:300]  Incision/Wound:clean.  Intact  LLQ open clean   Lab Results:   Recent Labs  02/12/14 0527  WBC 19.1*  HGB 9.6*  HCT 29.7*  PLT 191   BMET  Recent Labs  02/12/14 0527  NA 138  K 4.2  CL 100  CO2 23  GLUCOSE 105*  BUN 16  CREATININE 0.99  CALCIUM 9.2   PT/INR No results found for this basename: LABPROT, INR,  in the last 72 hours ABG No results found for this basename: PHART, PCO2, PO2, HCO3,  in the last 72 hours  Studies/Results: No results found.  Anti-infectives: Anti-infectives   Start     Dose/Rate Route Frequency Ordered Stop   02/09/14 1414  clindamycin (CLEOCIN) IVPB 900 mg     900 mg 100 mL/hr over 30 Minutes Intravenous 60 min pre-op 02/09/14 1414 02/10/14 0730   02/09/14 1414  gentamicin (GARAMYCIN) 350 mg in dextrose 5 % 100 mL IVPB     5 mg/kg  70.8 kg 108.8 mL/hr over 60 Minutes Intravenous 60 min pre-op 02/09/14 1414 02/10/14 0815      Assessment/Plan: s/p Procedure(s): COLOSTOMY REVERSAL (N/A) No BM yet Wants to try miralax Hopefully home sunday  LOS: 4 days    Ashley [name] A. 02/14/2014

## 2014-02-15 LAB — CBC
HCT: 31.4 % — ABNORMAL LOW (ref 36.0–46.0)
Hemoglobin: 10.6 g/dL — ABNORMAL LOW (ref 12.0–15.0)
MCH: 28.6 pg (ref 26.0–34.0)
MCHC: 33.8 g/dL (ref 30.0–36.0)
MCV: 84.6 fL (ref 78.0–100.0)
Platelets: 244 10*3/uL (ref 150–400)
RBC: 3.71 MIL/uL — ABNORMAL LOW (ref 3.87–5.11)
RDW: 12.7 % (ref 11.5–15.5)
WBC: 14.8 10*3/uL — ABNORMAL HIGH (ref 4.0–10.5)

## 2014-02-15 MED ORDER — CEFAZOLIN SODIUM 1-5 GM-% IV SOLN
1.0000 g | Freq: Three times a day (TID) | INTRAVENOUS | Status: DC
  Administered 2014-02-15 – 2014-02-19 (×12): 1 g via INTRAVENOUS
  Filled 2014-02-15 (×14): qty 50

## 2014-02-15 MED ORDER — LEVOFLOXACIN 500 MG PO TABS
500.0000 mg | ORAL_TABLET | Freq: Every day | ORAL | Status: AC
  Administered 2014-02-15 – 2014-02-19 (×5): 500 mg via ORAL
  Filled 2014-02-15 (×5): qty 1

## 2014-02-15 NOTE — Progress Notes (Signed)
Was called to the patients room secondary to moderate amounts of tannish-pink.  Ancef added.  Check CBC now.  Opened wound except for some staples inferiorly with old hematoma and purulence. Will need bid wet to dry dressing changes to midline, once daily to ostomy wound.  Will follow.

## 2014-02-15 NOTE — Progress Notes (Signed)
Patient refused to take Miralax stating ", The doctor this morning told me not to take it."

## 2014-02-15 NOTE — Progress Notes (Signed)
5 Days Post-Op  Subjective: Many complaints: dysuria bilateral flank muscle spasm,  Sinus infection has had flatus but no BM yet.  No vomiting  Objective: Vital signs in last 24 hours: Temp:  [97.4 F (36.3 C)-98.2 F (36.8 C)] 97.4 F (36.3 C) (10/04 0543) Pulse Rate:  [86-99] 89 (10/04 0543) Resp:  [17-18] 18 (10/04 0543) BP: (119-144)/(52-59) 144/59 mmHg (10/04 0543) SpO2:  [96 %-97 %] 96 % (10/04 0543) Last BM Date:  (no BM post op yet)  Intake/Output from previous day: 10/03 0701 - 10/04 0700 In: -  Out: 600 [Urine:600] Intake/Output this shift:    Incision/Wound:slight erythema superior portion of incision.  No drainage or fluctuance. Slight distention quiet.  No rebound or guarding. Open LLQ wound clean  Packed no foul odor  Lab Results:  No results found for this basename: WBC, HGB, HCT, PLT,  in the last 72 hours BMET No results found for this basename: NA, K, CL, CO2, GLUCOSE, BUN, CREATININE, CALCIUM,  in the last 72 hours PT/INR No results found for this basename: LABPROT, INR,  in the last 72 hours ABG No results found for this basename: PHART, PCO2, PO2, HCO3,  in the last 72 hours  Studies/Results: No results found.  Anti-infectives: Anti-infectives   Start     Dose/Rate Route Frequency Ordered Stop   02/15/14 1000  levofloxacin (LEVAQUIN) tablet 500 mg     500 mg Oral Daily 02/15/14 0819     02/09/14 1414  clindamycin (CLEOCIN) IVPB 900 mg     900 mg 100 mL/hr over 30 Minutes Intravenous 60 min pre-op 02/09/14 1414 02/10/14 0730   02/09/14 1414  gentamicin (GARAMYCIN) 350 mg in dextrose 5 % 100 mL IVPB     5 mg/kg  70.8 kg 108.8 mL/hr over 60 Minutes Intravenous 60 min pre-op 02/09/14 1414 02/10/14 0815      Assessment/Plan: s/p Procedure(s): COLOSTOMY REVERSAL (N/A) UTI add levaquin for UTI Await return of bowel function Slight erythema of superior portion of incision  Mild  Watch for now.  LOS: 5 days    Blakeley Margraf A. 02/15/2014

## 2014-02-16 ENCOUNTER — Encounter: Payer: Self-pay | Admitting: Family Medicine

## 2014-02-16 LAB — CBC
HCT: 32.5 % — ABNORMAL LOW (ref 36.0–46.0)
Hemoglobin: 10.6 g/dL — ABNORMAL LOW (ref 12.0–15.0)
MCH: 27.8 pg (ref 26.0–34.0)
MCHC: 32.6 g/dL (ref 30.0–36.0)
MCV: 85.3 fL (ref 78.0–100.0)
Platelets: 307 10*3/uL (ref 150–400)
RBC: 3.81 MIL/uL — ABNORMAL LOW (ref 3.87–5.11)
RDW: 12.8 % (ref 11.5–15.5)
WBC: 12.9 10*3/uL — ABNORMAL HIGH (ref 4.0–10.5)

## 2014-02-16 MED ORDER — MAGIC MOUTHWASH
5.0000 mL | Freq: Three times a day (TID) | ORAL | Status: DC | PRN
  Administered 2014-02-16 – 2014-02-17 (×2): 5 mL via ORAL
  Filled 2014-02-16 (×2): qty 5

## 2014-02-16 NOTE — Progress Notes (Signed)
Patient ID: Ashley [name], female   DOB: 04-29-1946, 68 y.o.   MRN: [id number]     [facility]      [address] [place], [place] [address]    Phone: [phone] FAX: [phone]     Subjective: Pt complains she is "dry."  UOP is adequate.  Eating and drinking, but not much of an appetite.  Offered nutritional supplements but declines stating they've made her vomit in the past.  Walking 1-2 times per day in hallways. Passing flatus.  No n/v.  Afebrile.  Dysuria has improved.   Objective:  Vital signs:  Filed Vitals:   02/15/14 1403 02/15/14 1452 02/15/14 2120 02/16/14 0540  BP: 113/47 110/57 105/50 118/57  Pulse: 92 93 92 102  Temp: 98.1 F (36.7 C) 98.1 F (36.7 C) 98.4 F (36.9 C) 98.2 F (36.8 C)  TempSrc: Oral Oral Oral Oral  Resp: 17  16 20   Height:      Weight:      SpO2: 100% 99% 92% 98%    Last BM Date:  (no BM post op yet)  Intake/Output   Yesterday:  10/04 0701 - 10/05 0700 In: 556 [P.O.:556] Out: 1700 [Urine:1700] This shift:    I/O last 3 completed shifts: In: 556 [P.O.:556] Out: 1700 [Urine:1700]    Physical Exam: General: Pt awake/alert/oriented x4 in no acute distress Chest: cta.  No chest wall pain w good excursion CV:  Pulses intact.  Regular rhythm MS: Normal AROM mjr joints.  No obvious deformity Abdomen: Soft.  Nondistended. Mildly tender at incisions only.  Midline wound is c/d/i, no surrounding erythema.  LLQ ostomy site, is c/d/i.  No evidence of peritonitis.  No incarcerated hernias. Ext:  SCDs BLE.  No mjr edema.  No cyanosis Skin: No petechiae / purpura   Problem List:   Active Problems:   S/P colostomy takedown    Results:   Labs: Results for orders placed during the hospital encounter of 02/10/14 (from the past 48 hour(s))  WOUND CULTURE     Status: None   Collection Time    02/15/14  3:06 PM      Result Value Ref Range   Specimen Description WOUND ABDOMEN     Special Requests NONE     Gram Stain       Value: MODERATE WBC PRESENT, PREDOMINANTLY PMN     RARE SQUAMOUS EPITHELIAL CELLS PRESENT     ABUNDANT GRAM POSITIVE COCCI IN PAIRS     FEW GRAM VARIABLE ROD     Performed at [facility]   Culture       Value: NO GROWTH 1 DAY     Performed at [facility]   Report Status PENDING    CBC     Status: Abnormal   Collection Time    02/15/14  4:57 PM      Result Value Ref Range   WBC 14.8 (*) 4.0 - 10.5 K/uL   RBC 3.71 (*) 3.87 - 5.11 MIL/uL   Hemoglobin 10.6 (*) 12.0 - 15.0 g/dL   HCT 31.4 (*) 36.0 - 46.0 %   MCV 84.6  78.0 - 100.0 fL   MCH 28.6  26.0 - 34.0 pg   MCHC 33.8  30.0 - 36.0 g/dL   RDW 12.7  11.5 - 15.5 %   Platelets 244  150 - 400 K/uL  CBC     Status: Abnormal   Collection Time  02/16/14 12:15 AM      Result Value Ref Range   WBC 12.9 (*) 4.0 - 10.5 K/uL   RBC 3.81 (*) 3.87 - 5.11 MIL/uL   Hemoglobin 10.6 (*) 12.0 - 15.0 g/dL   HCT 32.5 (*) 36.0 - 46.0 %   MCV 85.3  78.0 - 100.0 fL   MCH 27.8  26.0 - 34.0 pg   MCHC 32.6  30.0 - 36.0 g/dL   RDW 12.8  11.5 - 15.5 %   Platelets 307  150 - 400 K/uL    Imaging / Studies: No results found.  Medications / Allergies:  Scheduled Meds: . carvedilol  6.25 mg Oral BID WC  .  ceFAZolin (ANCEF) IV  1 g Intravenous 3 times per day  . enoxaparin (LOVENOX) injection  40 mg Subcutaneous Q24H  . levofloxacin  500 mg Oral Daily  . ofloxacin  5 drop Both Ears Daily  . omeprazole  40 mg Oral BID AC  . polyethylene glycol  17 g Oral Daily  . scopolamine  1 patch Transdermal Q72H  . spironolactone  25 mg Oral q morning - 10a  . triamcinolone  2 spray Nasal BID   Continuous Infusions:  PRN Meds:.acetaminophen, ALPRAZolam, diphenhydrAMINE, diphenhydrAMINE, fentaNYL, naloxone, ondansetron (ZOFRAN) IV, oxyCODONE, sodium chloride  Antibiotics: Anti-infectives   Start     Dose/Rate Route Frequency Ordered Stop   02/15/14 1515  ceFAZolin (ANCEF) IVPB 1 g/50 mL premix      1 g 100 mL/hr over 30 Minutes Intravenous 3 times per day 02/15/14 1511     02/15/14 1000  levofloxacin (LEVAQUIN) tablet 500 mg     500 mg Oral Daily 02/15/14 0819     02/09/14 1414  clindamycin (CLEOCIN) IVPB 900 mg     900 mg 100 mL/hr over 30 Minutes Intravenous 60 min pre-op 02/09/14 1414 02/10/14 0730   02/09/14 1414  gentamicin (GARAMYCIN) 350 mg in dextrose 5 % 100 mL IVPB     5 mg/kg  70.8 kg 108.8 mL/hr over 60 Minutes Intravenous 60 min pre-op 02/09/14 1414 02/10/14 0815        Assessment/Plan POD#6 colostomy reversal -await bowel function -encouraged PO intake.  She does not appear dry.  I do not see a need to call her cardiologist at this time.  She understands.  Will check her labs tomorrow. -wound opened, BID wet to dry dressing changes, will need remainder of staples removed, will attempt tomorrow -wound culture with GPC, levaquin, ancef, await final cultures -miralax -continue soft diet -SCD/lovenox -mobilize -IS UTI -levaquin D#2/3, await cultures  Erby Pian, ANP-BC [facility] Pager [phone](7A-4:30P) For consults and floor pages call [phone](7A-4:30P)  02/16/2014 7:55 AM

## 2014-02-17 ENCOUNTER — Other Ambulatory Visit: Payer: Self-pay | Admitting: Family Medicine

## 2014-02-17 LAB — CBC
HCT: 32.8 % — ABNORMAL LOW (ref 36.0–46.0)
Hemoglobin: 10.7 g/dL — ABNORMAL LOW (ref 12.0–15.0)
MCH: 27.9 pg (ref 26.0–34.0)
MCHC: 32.6 g/dL (ref 30.0–36.0)
MCV: 85.6 fL (ref 78.0–100.0)
Platelets: 337 10*3/uL (ref 150–400)
RBC: 3.83 MIL/uL — ABNORMAL LOW (ref 3.87–5.11)
RDW: 13 % (ref 11.5–15.5)
WBC: 11.7 10*3/uL — ABNORMAL HIGH (ref 4.0–10.5)

## 2014-02-17 LAB — BASIC METABOLIC PANEL
Anion gap: 18 — ABNORMAL HIGH (ref 5–15)
BUN: 15 mg/dL (ref 6–23)
CO2: 22 mEq/L (ref 19–32)
Calcium: 9.9 mg/dL (ref 8.4–10.5)
Chloride: 97 mEq/L (ref 96–112)
Creatinine, Ser: 1.13 mg/dL — ABNORMAL HIGH (ref 0.50–1.10)
GFR calc Af Amer: 57 mL/min — ABNORMAL LOW (ref >90)
GFR calc non Af Amer: 49 mL/min — ABNORMAL LOW (ref >90)
Glucose, Bld: 101 mg/dL — ABNORMAL HIGH (ref 70–99)
Potassium: 4.5 mEq/L (ref 3.7–5.3)
Sodium: 137 mEq/L (ref 137–147)

## 2014-02-17 LAB — WOUND CULTURE: Culture: NO GROWTH

## 2014-02-17 LAB — URINE CULTURE
Colony Count: NO GROWTH
Culture: NO GROWTH
Special Requests: NORMAL

## 2014-02-17 MED ORDER — OXYCODONE HCL 5 MG PO TABS
5.0000 mg | ORAL_TABLET | ORAL | Status: DC | PRN
  Administered 2014-02-17 (×2): 10 mg via ORAL
  Administered 2014-02-17: 5 mg via ORAL
  Administered 2014-02-17 – 2014-02-20 (×15): 10 mg via ORAL
  Filled 2014-02-17 (×19): qty 2

## 2014-02-17 MED ORDER — NYSTATIN 100000 UNIT/ML MT SUSP
5.0000 mL | Freq: Three times a day (TID) | OROMUCOSAL | Status: DC
  Administered 2014-02-17 – 2014-02-18 (×5): 500000 [IU] via ORAL
  Filled 2014-02-17 (×17): qty 5

## 2014-02-17 MED ORDER — POLYETHYLENE GLYCOL 3350 17 G PO PACK
0.5000 | PACK | Freq: Every day | ORAL | Status: DC
  Administered 2014-02-18: 8.5 g via ORAL
  Filled 2014-02-17 (×2): qty 1

## 2014-02-17 NOTE — Telephone Encounter (Signed)
Plz phone in

## 2014-02-17 NOTE — Progress Notes (Signed)
Patient ID: Ashley [name], female   DOB: 15-Jan-1946, 68 y.o.   MRN: [id number]     [facility]      [address] [place], [place] [address]    Phone: [phone] FAX: [phone]     Subjective: Pain overnight.  No n/v.  Still passing flatus.  Feels like she needs to have a BM.  Refusing stool softeners/miralax.  Afebrile.  VSS.    Objective:  Vital signs:  Filed Vitals:   02/16/14 0540 02/16/14 1350 02/16/14 2123 02/17/14 0554  BP: 118/57 141/66 100/54 114/52  Pulse: 102 96 96 84  Temp: 98.2 F (36.8 C) 98.5 F (36.9 C) 98.1 F (36.7 C) 97.8 F (36.6 C)  TempSrc: Oral Oral Oral Oral  Resp: _0 Height:      Weight:      SpO2: 98% 98% 95% 98%    Last BM Date: 02/10/14  Intake/Output   Yesterday:  10/05 0701 - 10/06 0700 In: 093 [P.O.:820; IV Piggyback:50] Out: 2671 [Urine:1550] This shift:    I/O last 3 completed shifts: In: 870 [P.O.:820; IV Piggyback:50] Out: 2950 [Urine:2950]   Physical Exam:  General: Pt awake/alert/oriented x4 in no acute distress  Chest: cta. No chest wall pain w good excursion  CV: Pulses intact. Regular rhythm  MS: Normal AROM mjr joints. No obvious deformity  Abdomen: Soft. Nondistended. Mildly tender at incisions only. Midline wound--beefy red, serous drainage, fascia is intact, wound repacked after remainder of staples were removed.  LLQ ostomy wound is beefy red serosanguinous output.  No evidence of peritonitis. No incarcerated hernias.  Ext: SCDs BLE. No mjr edema. No cyanosis  Skin: No petechiae / purpura   Problem List:   Active Problems:   S/P colostomy takedown    Results:   Labs: Results for orders placed during the hospital encounter of 02/10/14 (from the past 48 hour(s))  WOUND CULTURE     Status: None   Collection Time    02/15/14  3:06 PM      Result Value Ref Range   Specimen Description WOUND ABDOMEN     Special Requests NONE     Gram Stain        Value: MODERATE WBC PRESENT, PREDOMINANTLY PMN     RARE SQUAMOUS EPITHELIAL CELLS PRESENT     ABUNDANT GRAM POSITIVE COCCI IN PAIRS     FEW GRAM VARIABLE ROD     Performed at [facility]   Culture       Value: NO GROWTH 1 DAY     Performed at [facility]   Report Status PENDING    CBC     Status: Abnormal   Collection Time    02/15/14  4:57 PM      Result Value Ref Range   WBC 14.8 (*) 4.0 - 10.5 K/uL   RBC 3.71 (*) 3.87 - 5.11 MIL/uL   Hemoglobin 10.6 (*) 12.0 - 15.0 g/dL   HCT 31.4 (*) 36.0 - 46.0 %   MCV 84.6  78.0 - 100.0 fL   MCH 28.6  26.0 - 34.0 pg   MCHC 33.8  30.0 - 36.0 g/dL   RDW 12.7  11.5 - 15.5 %   Platelets 244  150 - 400 K/uL  CBC     Status: Abnormal   Collection Time    02/16/14 12:15 AM      Result Value Ref Range   WBC 12.9 (*)  4.0 - 10.5 K/uL   RBC 3.81 (*) 3.87 - 5.11 MIL/uL   Hemoglobin 10.6 (*) 12.0 - 15.0 g/dL   HCT 32.5 (*) 36.0 - 46.0 %   MCV 85.3  78.0 - 100.0 fL   MCH 27.8  26.0 - 34.0 pg   MCHC 32.6  30.0 - 36.0 g/dL   RDW 12.8  11.5 - 15.5 %   Platelets 307  150 - 400 K/uL  CBC     Status: Abnormal   Collection Time    02/17/14  4:50 AM      Result Value Ref Range   WBC 11.7 (*) 4.0 - 10.5 K/uL   RBC 3.83 (*) 3.87 - 5.11 MIL/uL   Hemoglobin 10.7 (*) 12.0 - 15.0 g/dL   HCT 32.8 (*) 36.0 - 46.0 %   MCV 85.6  78.0 - 100.0 fL   MCH 27.9  26.0 - 34.0 pg   MCHC 32.6  30.0 - 36.0 g/dL   RDW 13.0  11.5 - 15.5 %   Platelets 337  150 - 400 K/uL  BASIC METABOLIC PANEL     Status: Abnormal   Collection Time    02/17/14  4:50 AM      Result Value Ref Range   Sodium 137  137 - 147 mEq/L   Potassium 4.5  3.7 - 5.3 mEq/L   Chloride 97  96 - 112 mEq/L   CO2 22  19 - 32 mEq/L   Glucose, Bld 101 (*) 70 - 99 mg/dL   BUN 15  6 - 23 mg/dL   Creatinine, Ser 1.13 (*) 0.50 - 1.10 mg/dL   Calcium 9.9  8.4 - 10.5 mg/dL   GFR calc non Af Amer 49 (*) >90 mL/min   GFR calc Af Amer 57 (*) >90 mL/min   Comment: (NOTE)     The eGFR has  been calculated using the CKD EPI equation.     This calculation has not been validated in all clinical situations.     eGFR's persistently <90 mL/min signify possible Chronic Kidney     Disease.   Anion gap 18 (*) 5 - 15    Imaging / Studies: No results found.  Medications / Allergies:  Scheduled Meds: . carvedilol  6.25 mg Oral BID WC  .  ceFAZolin (ANCEF) IV  1 g Intravenous 3 times per day  . enoxaparin (LOVENOX) injection  40 mg Subcutaneous Q24H  . levofloxacin  500 mg Oral Daily  . ofloxacin  5 drop Both Ears Daily  . omeprazole  40 mg Oral BID AC  . polyethylene glycol  17 g Oral Daily  . scopolamine  1 patch Transdermal Q72H  . spironolactone  25 mg Oral q morning - 10a  . triamcinolone  2 spray Nasal BID   Continuous Infusions:  PRN Meds:.acetaminophen, ALPRAZolam, diphenhydrAMINE, diphenhydrAMINE, fentaNYL, magic mouthwash, naloxone, ondansetron (ZOFRAN) IV, oxyCODONE, sodium chloride  Antibiotics: Anti-infectives   Start     Dose/Rate Route Frequency Ordered Stop   02/15/14 1515  ceFAZolin (ANCEF) IVPB 1 g/50 mL premix     1 g 100 mL/hr over 30 Minutes Intravenous 3 times per day 02/15/14 1511     02/15/14 1000  levofloxacin (LEVAQUIN) tablet 500 mg     500 mg Oral Daily 02/15/14 0819     02/09/14 1414  clindamycin (CLEOCIN) IVPB 900 mg     900 mg 100 mL/hr over 30 Minutes Intravenous 60 min pre-op 02/09/14 1414 02/10/14 0730   02/09/14  1414  gentamicin (GARAMYCIN) 350 mg in dextrose 5 % 100 mL IVPB     5 mg/kg  70.8 kg 108.8 mL/hr over 60 Minutes Intravenous 60 min pre-op 02/09/14 1414 02/10/14 0815      Assessment/Plan  POD#7 colostomy reversal  -await bowel function  -encouraged PO intake. -wound opened, BID wet to dry dressing changes, fascia is intact, remainder of staples removed.  -wound culture with GPC, levaquin, ancef, await final cultures  -miralax  -continue soft diet  -SCD/lovenox  -mobilize  -IS  -add nystatin swish and swallow UTI   -levaquin D#3/5, await cultures   Erby Pian, ANP-BC [facility] Pager [phone](7A-4:30P) For consults and floor pages call [phone](7A-4:30P)  02/17/2014 7:41 AM

## 2014-02-17 NOTE — Care Management (Signed)
Update given to [facility] with [facility] [place]  [phone]. Magdalen Spatz RN BSN

## 2014-02-17 NOTE — Telephone Encounter (Signed)
Rx called in as directed.   

## 2014-02-17 NOTE — Progress Notes (Signed)
UR completed.  Litisha Guagliardo, RN BSN [facility] CCM Trauma/Neuro ICU Case Manager [phone]  

## 2014-02-17 NOTE — Telephone Encounter (Signed)
Ok to refill 

## 2014-02-18 ENCOUNTER — Inpatient Hospital Stay (HOSPITAL_COMMUNITY): Payer: Medicare Other

## 2014-02-18 LAB — CBC
HCT: 31.3 % — ABNORMAL LOW (ref 36.0–46.0)
Hemoglobin: 10.4 g/dL — ABNORMAL LOW (ref 12.0–15.0)
MCH: 28.5 pg (ref 26.0–34.0)
MCHC: 33.2 g/dL (ref 30.0–36.0)
MCV: 85.8 fL (ref 78.0–100.0)
Platelets: 365 10*3/uL (ref 150–400)
RBC: 3.65 MIL/uL — ABNORMAL LOW (ref 3.87–5.11)
RDW: 13 % (ref 11.5–15.5)
WBC: 12.2 10*3/uL — ABNORMAL HIGH (ref 4.0–10.5)

## 2014-02-18 MED ORDER — BISACODYL 10 MG RE SUPP: 10.0000 mg | Freq: Once | RECTAL | Status: DC

## 2014-02-18 NOTE — Progress Notes (Signed)
Patient c/o worsening pain on left side at end of rib cage.  oxcodone ir PO, and fentanyl iv have only helped some. Vs 97.4, p83, r20, 102/54, 95%saturation room air. Md notified, orders recieved

## 2014-02-18 NOTE — Progress Notes (Signed)
Patient complaining of worsening abdominal pain.  No fever, no chills.  WBC unchanged.  3-way abdominal X-ray with upright CXR shows not evidence of free air..  The is a paucity of gase in the distal sigmoid and rectum, but the colon is not very distended.  There does not appear to be any significant surgical problem.  Kathryne Eriksson. Dahlia Bailiff, MD, [place] [phone] [facility]

## 2014-02-18 NOTE — Progress Notes (Signed)
[facility] Progress Note  8 Days Post-Op  Subjective: Pt improving, has pain at wound site, improving.  Says thrush is improving.  Ambulating OOB.   Objective: Vital signs in last 24 hours: Temp:  [97.6 F (36.4 C)-98.2 F (36.8 C)] 97.6 F (36.4 C) (10/07 0516) Pulse Rate:  [70-78] 71 (10/07 0516) Resp:  [16] 16 (10/07 0516) BP: (108-112)/(48-55) 110/54 mmHg (10/07 0516) SpO2:  [95 %-100 %] 100 % (10/07 0516) Last BM Date: 02/10/14  Intake/Output from previous day: 10/06 0701 - 10/07 0700 In: 1030 [P.O.:1030] Out: 2350 [Urine:2350] Intake/Output this shift: Total I/O In: -  Out: 500 [Urine:500]  PE: Gen:  Alert, NAD, pleasant Abd: Soft, tender to palpation, ND, +BS, no HSM, midline wound with small amount of murky gray/yellow fluid at base, rest of tissue is beefy red and healing well, old ostomy site beefy red and healing well   Lab Results:   Recent Labs  02/16/14 0015 02/17/14 0450  WBC 12.9* 11.7*  HGB 10.6* 10.7*  HCT 32.5* 32.8*  PLT 307 337   BMET  Recent Labs  02/17/14 0450  NA 137  K 4.5  CL 97  CO2 22  GLUCOSE 101*  BUN 15  CREATININE 1.13*  CALCIUM 9.9   PT/INR No results found for this basename: LABPROT, INR,  in the last 72 hours CMP     Component Value Date/Time   NA 137 02/17/2014 0450   NA 143 03/10/2013 1300   K 4.5 02/17/2014 0450   CL 97 02/17/2014 0450   CO2 22 02/17/2014 0450   GLUCOSE 101* 02/17/2014 0450   GLUCOSE 105* 03/10/2013 1300   BUN 15 02/17/2014 0450   BUN 17 03/10/2013 1300   CREATININE 1.13* 02/17/2014 0450   CREATININE 2.16* 04/01/2012 1740   CALCIUM 9.9 02/17/2014 0450   PROT 9.1* 01/29/2014 0853   PROT 7.5 09/13/2012 1449   ALBUMIN 4.7 01/29/2014 0853   AST 24 01/29/2014 0853   ALT 24 01/29/2014 0853   ALKPHOS 125* 01/29/2014 0853   BILITOT 0.3 01/29/2014 0853   GFRNONAA 49* 02/17/2014 0450   GFRAA 57* 02/17/2014 0450   Lipase     Component Value Date/Time   LIPASE 55 09/30/2013 1705        Studies/Results: No results found.  Anti-infectives: Anti-infectives   Start     Dose/Rate Route Frequency Ordered Stop   02/15/14 1515  ceFAZolin (ANCEF) IVPB 1 g/50 mL premix     1 g 100 mL/hr over 30 Minutes Intravenous 3 times per day 02/15/14 1511     02/15/14 1000  levofloxacin (LEVAQUIN) tablet 500 mg     500 mg Oral Daily 02/15/14 0819 02/20/14 0959   02/09/14 1414  clindamycin (CLEOCIN) IVPB 900 mg     900 mg 100 mL/hr over 30 Minutes Intravenous 60 min pre-op 02/09/14 1414 02/10/14 0730   02/09/14 1414  gentamicin (GARAMYCIN) 350 mg in dextrose 5 % 100 mL IVPB     5 mg/kg  70.8 kg 108.8 mL/hr over 60 Minutes Intravenous 60 min pre-op 02/09/14 1414 02/10/14 0815       Assessment/Plan POD#8 colostomy reversal  -await bowel function  -encouraged PO intake.  -wound opened, BID wet to dry dressing changes, fascia is intact, all staples removed -wound culture with GPC, levaquin, ancef, await final cultures  -miralax  -continue soft diet  -SCD/lovenox  -mobilize  -IS  -add nystatin swish and swallow  -add dulcolax  UTI  -levaquin D#4/5, await cultures -  NGTD  Disp: Home tomorrow if she has a BM     LOS: 8 days    Ashley [name], Ashley [name] 02/18/2014, 10:38 AM Pager: [phone]

## 2014-02-19 MED ORDER — FLUCONAZOLE 150 MG PO TABS
150.0000 mg | ORAL_TABLET | Freq: Every day | ORAL | Status: AC
  Administered 2014-02-19 – 2014-02-20 (×2): 150 mg via ORAL
  Filled 2014-02-19 (×3): qty 1

## 2014-02-19 MED ORDER — OXYCODONE HCL 5 MG PO TABS: 5.0000 mg | ORAL_TABLET | ORAL | Status: DC | PRN

## 2014-02-19 MED ORDER — ALPRAZOLAM 0.25 MG PO TABS: 0.2500 mg | ORAL_TABLET | Freq: Two times a day (BID) | ORAL | Status: DC

## 2014-02-19 MED ORDER — ALPRAZOLAM 0.25 MG PO TABS
0.2500 mg | ORAL_TABLET | Freq: Two times a day (BID) | ORAL | Status: DC
  Administered 2014-02-19: 0.25 mg via ORAL
  Filled 2014-02-19 (×2): qty 1

## 2014-02-19 MED ORDER — POLYETHYLENE GLYCOL 3350 17 G PO PACK
17.0000 g | PACK | Freq: Every day | ORAL | Status: DC
  Administered 2014-02-19: 17 g via ORAL
  Filled 2014-02-19: qty 1

## 2014-02-19 NOTE — Progress Notes (Signed)
Seen and agree  

## 2014-02-19 NOTE — Progress Notes (Signed)
Patient ID: Ashley [name], female   DOB: [birth year], 68 y.o.   MRN: [id number] 9 Days Post-Op  Subjective: Pt states she feels like she is going to choke to death because she feels short of breath because of dry mouth.  She is sating 99% on room air.  She requested O2 be put on her anyway.  She is eating some.  She still has not had a BM.  Objective: Vital signs in last 24 hours: Temp:  [97.4 F (36.3 C)-98.4 F (36.9 C)] 98 F (36.7 C) (10/08 0653) Pulse Rate:  [78-88] 78 (10/08 0653) Resp:  [16-22] 22 (10/08 0653) BP: (102-126)/(41-54) 126/51 mmHg (10/08 0653) SpO2:  [95 %-100 %] 99 % (10/08 0653) Last BM Date: 02/10/14  Intake/Output from previous day: 10/07 0701 - 10/08 0700 In: 680 [P.O.:480; IV Piggyback:200] Out: 1800 [Urine:1800] Intake/Output this shift: Total I/O In: -  Out: 300 [Urine:300]  PE: Abd: soft, +BS, wounds are clean and packed, ND Heart: regular Lungs: CTAB  Lab Results:   Recent Labs  02/17/14 0450 02/18/14 1910  WBC 11.7* 12.2*  HGB 10.7* 10.4*  HCT 32.8* 31.3*  PLT 337 365   BMET  Recent Labs  02/17/14 0450  NA 137  K 4.5  CL 97  CO2 22  GLUCOSE 101*  BUN 15  CREATININE 1.13*  CALCIUM 9.9   PT/INR No results found for this basename: LABPROT, INR,  in the last 72 hours CMP     Component Value Date/Time   NA 137 02/17/2014 0450   NA 143 03/10/2013 1300   K 4.5 02/17/2014 0450   CL 97 02/17/2014 0450   CO2 22 02/17/2014 0450   GLUCOSE 101* 02/17/2014 0450   GLUCOSE 105* 03/10/2013 1300   BUN 15 02/17/2014 0450   BUN 17 03/10/2013 1300   CREATININE 1.13* 02/17/2014 0450   CREATININE 2.16* 04/01/2012 1740   CALCIUM 9.9 02/17/2014 0450   PROT 9.1* 01/29/2014 0853   PROT 7.5 09/13/2012 1449   ALBUMIN 4.7 01/29/2014 0853   AST 24 01/29/2014 0853   ALT 24 01/29/2014 0853   ALKPHOS 125* 01/29/2014 0853   BILITOT 0.3 01/29/2014 0853   GFRNONAA 49* 02/17/2014 0450   GFRAA 57* 02/17/2014 0450   Lipase     Component Value Date/Time   LIPASE 55 09/30/2013 1705       Studies/Results: Dg Abd Acute W/chest  02/18/2014   CLINICAL DATA:  Persistent lower abdominal pain and constipation  EXAM: ACUTE ABDOMEN SERIES (ABDOMEN 2 VIEW & CHEST 1 VIEW)  COMPARISON:  Chest radiograph February 05, 2014; abdominal radiograph Oct 06, 2013  FINDINGS: PA chest: Lungs are clear. Heart size and pulmonary vascularity are normal. 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 No adenopathy.  Supine and upright abdomen: There is moderate stool throughout colon. Bowel gas pattern is unremarkable. No obstruction or free air. Small pelvic calcifications are consistent with phleboliths.  IMPRESSION: Moderate stool throughout colon. Bowel gas pattern unremarkable. No edema or consolidation.   Electronically Signed   By: Bretta Bang M.D.   On: 02/18/2014 21:28    Anti-infectives: Anti-infectives   Start     Dose/Rate Route Frequency Ordered Stop   02/15/14 1515  ceFAZolin (ANCEF) IVPB 1 g/50 mL premix     1 g 100 mL/hr over 30 Minutes Intravenous 3 times per day 02/15/14 1511     02/15/14 1000  levofloxacin (LEVAQUIN) tablet 500 mg     500 mg Oral Daily 02/15/14 0819  02/20/14 0959   02/09/14 1414  clindamycin (CLEOCIN) IVPB 900 mg     900 mg 100 mL/hr over 30 Minutes Intravenous 60 min pre-op 02/09/14 1414 02/10/14 0730   02/09/14 1414  gentamicin (GARAMYCIN) 350 mg in dextrose 5 % 100 mL IVPB     5 mg/kg  70.8 kg 108.8 mL/hr over 60 Minutes Intravenous 60 min pre-op 02/09/14 1414 02/10/14 0815       Assessment/Plan   POD#9 colostomy reversal  -await bowel function  -encouraged PO intake.  -wound opened, cont BID dressing changes -wound culture is negative.  Will d/w MD about further need of abx therapy -miralax  -continue soft diet  -SCD/lovenox  -mobilize  -IS  -add nystatin swish and swallow  -add dulcolax, patient refuses right now  UTI  -levaquin D#5/5, await cultures - NG final Anxiety: -it appears the patient is  having anxiety and panic as opposed to any true pulmonary issues as her CXR is clear, her lungs are clear, and her O2 sats are great on room air. I have added back her scheduled ativan.  Disp: Home tomorrow if she has a BM   LOS: 9 days    Essica Kiker E 02/19/2014, 8:28 AM Pager: [phone]

## 2014-02-19 NOTE — Plan of Care (Signed)
Problem: Phase I Progression Outcomes Goal: Sutures/staples intact Outcome: Not Applicable Date Met:  02/19/14 Staples have been removed; wound is now wet to dry dressing  Problem: Phase II Progression Outcomes Goal: Return of bowel function (flatus, BM) IF ABDOMINAL SURGERY:  Outcome: Completed/Met Date Met:  02/19/14 Patient was able to have a bowel movement this evening     

## 2014-02-19 NOTE — Progress Notes (Signed)
Patient had a medium soft brown bowel movement this evening.

## 2014-02-19 NOTE — Progress Notes (Signed)
Patient called RN to room and stated that she was in a deep sleep and woke up "feeling like she was about to faint."  Patient remains alert and oriented, all vital signs are within normal limits.  Patient was able to get up and walk to the bathroom with minimal assistance and no lightheadedness.  She complains of dry mouth and begins to panic when she coughs, however refuses Xanax.   Instructed patient to notify RN if she began to feel "faint" again.  Will continue to monitor closely.

## 2014-02-19 NOTE — Progress Notes (Signed)
Patient passed a small amount of brownish red blood while sitting on toilet.  She wanted nurse to leave blood in hat so MD could observe. Hat left in bathroom.  This am patient is complaining of dry throat and she is afraid that if she goes to sleep she will choke to death. She feels like she is dehydrated and wants MD called so he will come see her. She believes her dry throat is making it hard for her to catch her breath.  02 sat 99% on room air,but patient insisting that she be placed on 02 2liters.  Vital signs stable.  Patient also stated she needed to cough,but couldn't because of her dry throat and mouth.  Encouraged her to use her incentive spirometer, but she states that it makes her throat even more dry.Will continue to monitor.

## 2014-02-20 MED ORDER — OXYCODONE HCL 5 MG PO TABS: 5.0000 mg | ORAL_TABLET | ORAL | Status: DC | PRN

## 2014-02-20 NOTE — Discharge Summary (Signed)
Physician Discharge Summary  Patient ID: Ashley [name] MRN: [id number] DOB/AGE: 1945/06/05 68 y.o.  Admit date: 02/10/2014 Discharge date: 02/20/2014  Admission Diagnoses:  Diverticulitis s/p Hartman's procedure    Anxiety    Congestive heart failure    Fibromyalgia  Discharge Diagnoses:   Same Active Problems:   S/P colostomy takedown   Discharged Condition: good  Hospital Course: Colostomy reversal on 02/10/14.  She began passing flatus on POD #3, but did not have a bowel movement for almost 9 days.  She has a lot of anxiety issues as well as pain control issues.  Her pain is controlled with PRN Oxycodone.  Her midline incision was opened and some seroma was evacuated.  Both wounds have been managed with wet to dry dressings.  Consults: None  Significant Diagnostic Studies: labs:  Lab Results  Component Value Date   WBC 12.2* 02/18/2014   HGB 10.4* 02/18/2014   HCT 31.3* 02/18/2014   MCV 85.8 02/18/2014   PLT 365 02/18/2014   Lab Results  Component Value Date   CREATININE 1.13* 02/17/2014   BUN 15 02/17/2014   NA 137 02/17/2014   K 4.5 02/17/2014   CL 97 02/17/2014   CO2 22 02/17/2014     Treatments: surgery: colostomy reversal   Discharge Exam: Blood pressure 113/52, pulse 93, temperature 98.2 F (36.8 C), temperature source Oral, resp. rate 17, height 5' 5.5" (1.664 m), weight 158 lb (71.668 kg), SpO2 98.00%. General appearance: alert, cooperative and no distress GI: soft, minimal tenderness Lower midline and LLQ wounds clean with granulation tissue  Disposition: 01-Home or Self Care  Discharge Instructions   Call MD for:  persistant nausea and vomiting    Complete by:  As directed      Call MD for:  redness, tenderness, or signs of infection (pain, swelling, redness, odor or green/yellow discharge around incision site)    Complete by:  As directed      Call MD for:  severe uncontrolled pain    Complete by:  As directed      Call MD for:  temperature >100.4     Complete by:  As directed      Diet general    Complete by:  As directed      Driving Restrictions    Complete by:  As directed   Do not drive while taking pain medications     Increase activity slowly    Complete by:  As directed      May shower / Bathe    Complete by:  As directed      May walk up steps    Complete by:  As directed             Medication List         acetaminophen 325 MG tablet  Commonly known as:  TYLENOL  Take 325 mg by mouth 2 (two) times daily as needed for moderate pain.     alendronate 70 MG tablet  Commonly known as:  FOSAMAX  Take 70 mg by mouth every Thursday. Take with a full glass of water on an empty stomach.     ALPRAZolam 0.25 MG tablet  Commonly known as:  XANAX  TAKE 1 TABLET BY MOUTH TWICE A DAY     aspirin EC 81 MG tablet  Take 1 tablet (81 mg total) by mouth daily.     BIOTIN 5000 PO  Take 5,000 mcg by mouth daily.     CALCIUM +  D PO  Take 1 tablet by mouth daily.     carvedilol 12.5 MG tablet  Commonly known as:  COREG  Take 6.25 mg by mouth 2 (two) times daily with a meal.     CO Q 10 PO  Take 1 tablet by mouth daily.     ofloxacin 0.3 % otic solution  Commonly known as:  FLOXIN  Place 5 drops into both ears daily.     omeprazole 40 MG capsule  Commonly known as:  PRILOSEC  Take 1 capsule (40 mg total) by mouth 2 (two) times daily before a meal.     oxyCODONE 5 MG immediate release tablet  Commonly known as:  Oxy IR/ROXICODONE  Take 1-2 tablets (5-10 mg total) by mouth every 3 (three) hours as needed for moderate pain.     PRESCRIPTION MEDICATION  Place 1 application into both ears daily as needed (MED NAME: mometasone 0.1% SOLUTION (for eczema on ears)).     spironolactone 25 MG tablet  Commonly known as:  ALDACTONE  Take 25 mg by mouth every morning.     SYSTANE OP  Place 1 drop into both eyes 2 (two) times daily as needed (for dry eyes).     torsemide 10 MG tablet  Commonly known as:  DEMADEX  Take 10  mg by mouth daily as needed (for water retention).     triamcinolone 55 MCG/ACT Aero nasal inhaler  Commonly known as:  NASACORT  Place 2 sprays into the nose 2 (two) times daily.     vitamin C 1000 MG tablet  Take 1,000 mg by mouth daily.     vitamin E 100 UNIT capsule  Take 100 Units by mouth daily.           Follow-up Information   Follow up with Maia Petties., MD. Schedule an appointment as soon as possible for a visit in 2 weeks.   Specialty:  General Surgery   Contact information:   [address] [place] [address] [phone]       Signed: Maia Petties. 02/20/2014, 7:48 AM

## 2014-02-20 NOTE — Progress Notes (Signed)
IV out per order. Discharge instructions and prescription given to patient with great teachback. Discharged via wheelchair to husband's care with NT present. Melford Aase, RN

## 2014-02-20 NOTE — Discharge Instructions (Signed)
[facility], [place] [phone]  OPEN ABDOMINAL SURGERY: POST OP INSTRUCTIONS  Always review your discharge instruction sheet given to you by the facility where your surgery was performed.  IF YOU HAVE DISABILITY OR FAMILY LEAVE FORMS, YOU MUST BRING THEM TO THE OFFICE FOR PROCESSING.  PLEASE DO NOT GIVE THEM TO YOUR DOCTOR.  1. A prescription for pain medication may be given to you upon discharge.  Take your pain medication as prescribed, if needed.  If narcotic pain medicine is not needed, then you may take acetaminophen (Tylenol) or ibuprofen (Advil) as needed. 2. Take your usually prescribed medications unless otherwise directed. 3. If you need a refill on your pain medication, please contact your pharmacy. They will contact our office to request authorization.  Prescriptions will not be filled after 5pm or on week-ends. 4. You should follow a light diet the first few days after arrival home, such as soup and crackers, pudding, etc.unless your doctor has advised otherwise. A high-fiber, low fat diet can be resumed as tolerated.   Be sure to include lots of fluids daily. Most patients will experience some swelling and bruising on the chest and neck area.  Ice packs will help.  Swelling and bruising can take several days to resolve 5. Most patients will experience some swelling and bruising in the area of the incision. Ice pack will help. Swelling and bruising can take several days to resolve..  6. It is common to experience some constipation if taking pain medication after surgery.  Increasing fluid intake and taking a stool softener will usually help or prevent this problem from occurring.  A mild laxative (Milk of Magnesia or Miralax) should be taken according to package directions if there are no bowel movements after 48 hours. 7. Both midline and left lower quadrant wounds need to be changed daily with wet to dry dressings.  Home health nursing has been  arranged.  8. ACTIVITIES:  You may resume regular (light) daily activities beginning the next day--such as daily self-care, walking, climbing stairs--gradually increasing activities as tolerated.  You may have sexual intercourse when it is comfortable.  Refrain from any heavy lifting or straining until approved by your doctor. a. You may drive when you no longer are taking prescription pain medication, you can comfortably wear a seatbelt, and you can safely maneuver your car and apply brakes b. Return to Work: ___________________________________ 62. You should see your doctor in the office for a follow-up appointment approximately two weeks after your surgery.  Make sure that you call for this appointment within a day or two after you arrive home to insure a convenient appointment time. OTHER INSTRUCTIONS:  _____________________________________________________________ _____________________________________________________________  WHEN TO CALL YOUR DOCTOR: 1. Fever over 101.0 2. Inability to urinate 3. Nausea and/or vomiting 4. Extreme swelling or bruising 5. Continued bleeding from incision. 6. Increased pain, redness, or drainage from the incision. 7. Difficulty swallowing or breathing 8. Muscle cramping or spasms. 9. Numbness or tingling in hands or feet or around lips.  The clinic staff is available to answer your questions during regular business hours.  Please dont hesitate to call and ask to speak to one of the nurses if you have concerns.  For further questions, please visit [email or web address]

## 2014-02-23 DIAGNOSIS — I10 Essential (primary) hypertension: Secondary | ICD-10-CM | POA: Diagnosis not present

## 2014-02-23 DIAGNOSIS — K5780 Diverticulitis of intestine, part unspecified, with perforation and abscess without bleeding: Secondary | ICD-10-CM | POA: Diagnosis not present

## 2014-02-23 DIAGNOSIS — I340 Nonrheumatic mitral (valve) insufficiency: Secondary | ICD-10-CM | POA: Diagnosis not present

## 2014-02-23 DIAGNOSIS — I429 Cardiomyopathy, unspecified: Secondary | ICD-10-CM | POA: Diagnosis not present

## 2014-02-23 DIAGNOSIS — I5022 Chronic systolic (congestive) heart failure: Secondary | ICD-10-CM | POA: Diagnosis not present

## 2014-02-23 DIAGNOSIS — Z48815 Encounter for surgical aftercare following surgery on the digestive system: Secondary | ICD-10-CM | POA: Diagnosis not present

## 2014-02-27 DIAGNOSIS — I10 Essential (primary) hypertension: Secondary | ICD-10-CM | POA: Diagnosis not present

## 2014-02-27 DIAGNOSIS — I429 Cardiomyopathy, unspecified: Secondary | ICD-10-CM | POA: Diagnosis not present

## 2014-02-27 DIAGNOSIS — Z48815 Encounter for surgical aftercare following surgery on the digestive system: Secondary | ICD-10-CM | POA: Diagnosis not present

## 2014-02-27 DIAGNOSIS — I5022 Chronic systolic (congestive) heart failure: Secondary | ICD-10-CM | POA: Diagnosis not present

## 2014-02-27 DIAGNOSIS — K5780 Diverticulitis of intestine, part unspecified, with perforation and abscess without bleeding: Secondary | ICD-10-CM | POA: Diagnosis not present

## 2014-02-27 DIAGNOSIS — I340 Nonrheumatic mitral (valve) insufficiency: Secondary | ICD-10-CM | POA: Diagnosis not present

## 2014-03-02 DIAGNOSIS — I340 Nonrheumatic mitral (valve) insufficiency: Secondary | ICD-10-CM | POA: Diagnosis not present

## 2014-03-02 DIAGNOSIS — Z48815 Encounter for surgical aftercare following surgery on the digestive system: Secondary | ICD-10-CM | POA: Diagnosis not present

## 2014-03-02 DIAGNOSIS — I5022 Chronic systolic (congestive) heart failure: Secondary | ICD-10-CM | POA: Diagnosis not present

## 2014-03-02 DIAGNOSIS — K5780 Diverticulitis of intestine, part unspecified, with perforation and abscess without bleeding: Secondary | ICD-10-CM | POA: Diagnosis not present

## 2014-03-02 DIAGNOSIS — I10 Essential (primary) hypertension: Secondary | ICD-10-CM | POA: Diagnosis not present

## 2014-03-02 DIAGNOSIS — I429 Cardiomyopathy, unspecified: Secondary | ICD-10-CM | POA: Diagnosis not present

## 2014-03-05 ENCOUNTER — Other Ambulatory Visit: Payer: Self-pay | Admitting: Cardiovascular Disease

## 2014-03-05 DIAGNOSIS — I429 Cardiomyopathy, unspecified: Secondary | ICD-10-CM | POA: Diagnosis not present

## 2014-03-05 DIAGNOSIS — I340 Nonrheumatic mitral (valve) insufficiency: Secondary | ICD-10-CM | POA: Diagnosis not present

## 2014-03-05 DIAGNOSIS — Z48815 Encounter for surgical aftercare following surgery on the digestive system: Secondary | ICD-10-CM | POA: Diagnosis not present

## 2014-03-05 DIAGNOSIS — I10 Essential (primary) hypertension: Secondary | ICD-10-CM | POA: Diagnosis not present

## 2014-03-05 DIAGNOSIS — I5022 Chronic systolic (congestive) heart failure: Secondary | ICD-10-CM | POA: Diagnosis not present

## 2014-03-05 DIAGNOSIS — K5780 Diverticulitis of intestine, part unspecified, with perforation and abscess without bleeding: Secondary | ICD-10-CM | POA: Diagnosis not present

## 2014-03-09 DIAGNOSIS — Z48815 Encounter for surgical aftercare following surgery on the digestive system: Secondary | ICD-10-CM | POA: Diagnosis not present

## 2014-03-09 DIAGNOSIS — I429 Cardiomyopathy, unspecified: Secondary | ICD-10-CM | POA: Diagnosis not present

## 2014-03-09 DIAGNOSIS — I5022 Chronic systolic (congestive) heart failure: Secondary | ICD-10-CM | POA: Diagnosis not present

## 2014-03-09 DIAGNOSIS — I340 Nonrheumatic mitral (valve) insufficiency: Secondary | ICD-10-CM | POA: Diagnosis not present

## 2014-03-09 DIAGNOSIS — K5780 Diverticulitis of intestine, part unspecified, with perforation and abscess without bleeding: Secondary | ICD-10-CM | POA: Diagnosis not present

## 2014-03-09 DIAGNOSIS — I10 Essential (primary) hypertension: Secondary | ICD-10-CM | POA: Diagnosis not present

## 2014-03-13 ENCOUNTER — Ambulatory Visit (INDEPENDENT_AMBULATORY_CARE_PROVIDER_SITE_OTHER): Payer: Medicare Other | Admitting: Family Medicine

## 2014-03-13 ENCOUNTER — Encounter: Payer: Self-pay | Admitting: *Deleted

## 2014-03-13 ENCOUNTER — Encounter: Payer: Self-pay | Admitting: Family Medicine

## 2014-03-13 VITALS — BP 110/80 | HR 72 | Temp 97.7°F | Wt 151.5 lb

## 2014-03-13 DIAGNOSIS — Z9889 Other specified postprocedural states: Secondary | ICD-10-CM

## 2014-03-13 DIAGNOSIS — N183 Chronic kidney disease, stage 3 unspecified: Secondary | ICD-10-CM

## 2014-03-13 DIAGNOSIS — E7800 Pure hypercholesterolemia, unspecified: Secondary | ICD-10-CM

## 2014-03-13 DIAGNOSIS — I5022 Chronic systolic (congestive) heart failure: Secondary | ICD-10-CM | POA: Diagnosis not present

## 2014-03-13 DIAGNOSIS — Z79899 Other long term (current) drug therapy: Secondary | ICD-10-CM | POA: Diagnosis not present

## 2014-03-13 LAB — RENAL FUNCTION PANEL
Albumin: 3.8 g/dL (ref 3.5–5.2)
BUN: 16 mg/dL (ref 6–23)
CO2: 25 mEq/L (ref 19–32)
Calcium: 10.1 mg/dL (ref 8.4–10.5)
Chloride: 104 mEq/L (ref 96–112)
Creatinine, Ser: 1.2 mg/dL (ref 0.4–1.2)
GFR: 47.39 mL/min — ABNORMAL LOW (ref >60.00)
Glucose, Bld: 87 mg/dL (ref 70–99)
Phosphorus: 3.7 mg/dL (ref 2.3–4.6)
Potassium: 4.2 mEq/L (ref 3.5–5.1)
Sodium: 140 mEq/L (ref 135–145)

## 2014-03-13 LAB — LIPID PANEL
Cholesterol: 338 mg/dL — ABNORMAL HIGH (ref 0–200)
HDL: 46.3 mg/dL (ref >39.00)
NonHDL: 291.7
Total CHOL/HDL Ratio: 7
Triglycerides: 214 mg/dL — ABNORMAL HIGH (ref 0.0–149.0)
VLDL: 42.8 mg/dL — ABNORMAL HIGH (ref 0.0–40.0)

## 2014-03-13 LAB — LDL CHOLESTEROL, DIRECT: Direct LDL: 240.9 mg/dL

## 2014-03-13 NOTE — Assessment & Plan Note (Signed)
Recheck FLP today and then decide if need chol med. H/o intolerance to statins in the past. Would agree with RYR.

## 2014-03-13 NOTE — Assessment & Plan Note (Signed)
Seems euvolemic today. Did not restart torsemide.

## 2014-03-13 NOTE — Progress Notes (Signed)
BP 110/80  Pulse 72  Temp(Src) 97.7 F (36.5 C) (Oral)  Wt 151 lb 8 oz (68.72 kg)   CC: surgery follow up  Subjective:    Patient ID: Ashley [name], female    DOB: January 07, 1946, 68 y.o.   MRN: [id number]  HPI: Ashley [name] is a 68 y.o. female presenting on 03/13/2014 for Follow-up   Reviewed recent surgery f/u note and recent complicated hospitalization. Briefly, s/p colostomy takedown from perf diverticulitis earlier this year. Latest hosp course complicated by prolonged post op ileus. Started on 1 wk probiotic course.   She had trouble with diarrhea and vomiting last week.  Sees surgery Q2 wks, has home dressing changes for abd wounds (closing by second intention).  Having leg aches.   Fasting today, asks for FLP today.  Wt Readings from Last 3 Encounters:  03/13/14 151 lb 8 oz (68.72 kg)  02/10/14 158 lb (71.668 kg)  02/10/14 158 lb (71.668 kg)   Body mass index is 24.82 kg/(m^2).   Relevant past medical, surgical, family and social history reviewed and updated as indicated.  Allergies and medications reviewed and updated. Current Outpatient Prescriptions on File Prior to Visit  Medication Sig  . acetaminophen (TYLENOL) 325 MG tablet Take 325 mg by mouth 2 (two) times daily as needed for moderate pain.  Marland Kitchen ALPRAZolam (XANAX) 0.25 MG tablet TAKE 1 TABLET BY MOUTH TWICE A DAY  . Ascorbic Acid (VITAMIN C) 1000 MG tablet Take 1,000 mg by mouth daily.  Marland Kitchen BIOTIN 5000 PO Take 5,000 mcg by mouth daily.  . Calcium Carbonate-Vitamin D (CALCIUM + D PO) Take 1 tablet by mouth daily.  . carvedilol (COREG) 12.5 MG tablet TAKE 1/2 TABLET BY MOUTH 2 (TWO) TIMES DAILY WITH A MEAL.  Marland Kitchen Coenzyme Q10 (CO Q 10 PO) Take 1 tablet by mouth daily.  Marland Kitchen ofloxacin (FLOXIN) 0.3 % otic solution Place 5 drops into both ears daily.  Marland Kitchen omeprazole (PRILOSEC) 40 MG capsule Take 1 capsule (40 mg total) by mouth 2 (two) times daily before a meal.  . Polyethyl Glycol-Propyl Glycol (SYSTANE OP) Place  1 drop into both eyes 2 (two) times daily as needed (for dry eyes).  Marland Kitchen PRESCRIPTION MEDICATION Place 1 application into both ears daily as needed (MED NAME: mometasone 0.1% SOLUTION (for eczema on ears)).  Marland Kitchen spironolactone (ALDACTONE) 25 MG tablet Take 25 mg by mouth every morning.  . triamcinolone (NASACORT) 55 MCG/ACT AERO nasal inhaler Place 2 sprays into the nose 2 (two) times daily.  . vitamin E 100 UNIT capsule Take 100 Units by mouth daily.  Marland Kitchen alendronate (FOSAMAX) 70 MG tablet Take 70 mg by mouth every Thursday. Take with a full glass of water on an empty stomach.  Marland Kitchen aspirin EC 81 MG tablet Take 1 tablet (81 mg total) by mouth daily.  Marland Kitchen torsemide (DEMADEX) 10 MG tablet Take 10 mg by mouth daily as needed (for water retention).   . [DISCONTINUED] bisoprolol (ZEBETA) 5 MG tablet Take 5 mg by mouth daily.   No current facility-administered medications on file prior to visit.    Review of Systems Per HPI unless specifically indicated above    Objective:    BP 110/80  Pulse 72  Temp(Src) 97.7 F (36.5 C) (Oral)  Wt 151 lb 8 oz (68.72 kg)  Physical Exam  Nursing note and vitals reviewed. Constitutional: She appears well-developed and well-nourished. No distress.  HENT:  Head: Normocephalic and atraumatic.  Mouth/Throat: Oropharynx is clear and moist.  No oropharyngeal exudate.  Eyes: Conjunctivae and EOM are normal. Pupils are equal, round, and reactive to light.  Neck: Normal range of motion. Neck supple.  Cardiovascular: Normal rate, regular rhythm, normal heart sounds and intact distal pulses.   No murmur heard. Pulmonary/Chest: Effort normal and breath sounds normal. No respiratory distress. She has no wheezes. She has no rales.  Musculoskeletal: She exhibits no edema.  Lymphadenopathy:    She has no cervical adenopathy.       Assessment & Plan:   Problem List Items Addressed This Visit   S/P colostomy takedown     Healing well, followed by surgery. Appreciate their  care of patient.    HYPERCHOLESTEROLEMIA, PURE     Recheck FLP today and then decide if need chol med. H/o intolerance to statins in the past. Would agree with RYR.    Relevant Orders      Lipid panel   CKD (chronic kidney disease) stage 3, GFR 30-59 ml/min - Primary     Check renal panel today.    Relevant Orders      Renal function panel   Chronic systolic CHF (congestive heart failure)     Seems euvolemic today. Did not restart torsemide.        Follow up plan: Return in about 3 months (around 06/13/2014), or as needed, for follow up visit.

## 2014-03-13 NOTE — Assessment & Plan Note (Signed)
Healing well, followed by surgery. Appreciate their care of patient.

## 2014-03-13 NOTE — Patient Instructions (Addendum)
Ok to restart aspirin on Monday, Wednesday, Friday. Continue to hold fosamax for now. Blood work today to check cholesterol levels. Controlled substance agreement form today and urine today.

## 2014-03-13 NOTE — Progress Notes (Signed)
Pre visit review using our clinic review tool, if applicable. No additional management support is needed unless otherwise documented below in the visit note. 

## 2014-03-13 NOTE — Assessment & Plan Note (Signed)
Check renal panel today. 

## 2014-03-16 ENCOUNTER — Telehealth: Payer: Self-pay

## 2014-03-16 DIAGNOSIS — I5032 Chronic diastolic (congestive) heart failure: Secondary | ICD-10-CM

## 2014-03-16 NOTE — Telephone Encounter (Signed)
Pt states she feels like her chest is bruised and sore around her heart. States on 9/29 she had reconstruction colon surgery. Pt states she is anxious due to the fact that she developed an infection from her surgery.and her HR was 90-93, 89.Please call.

## 2014-03-17 NOTE — Telephone Encounter (Signed)
Spoke w/ pt.  She reports that she had her colon reconstructive surgery on 02/10/14. Since then, her HR has been consistently in the 90s.  Per pt, her PCP told her "your heart is running away" and her HR is too fast. She reports an increased amount of gas since her surgery and "soreness and pressure around my heart". She is requesting an ECHO and is unsure if she can get one at this time.  Please advise.  Thank you.

## 2014-03-17 NOTE — Telephone Encounter (Signed)
Would increase the carvedilol up to 12.5 mg twice a day This can help the heart rate slowed down Echocardiogram can be done but will not help heart rate Certainly if she would like, this can be ordered If she has anemia following the surgery, this will cause a higher heart rate

## 2014-03-18 MED ORDER — CARVEDILOL 12.5 MG PO TABS: 12.5000 mg | ORAL_TABLET | Freq: Two times a day (BID) | ORAL | Status: DC

## 2014-03-18 NOTE — Telephone Encounter (Signed)
Spoke w/ pt.  Advised her of Dr. Donivan Scull recommendation.  She verbalizes understanding and is agreeable. Order placed for ECHO and pt to schedule.

## 2014-03-20 ENCOUNTER — Telehealth: Payer: Self-pay

## 2014-03-20 DIAGNOSIS — K5780 Diverticulitis of intestine, part unspecified, with perforation and abscess without bleeding: Secondary | ICD-10-CM | POA: Diagnosis not present

## 2014-03-20 DIAGNOSIS — Z48815 Encounter for surgical aftercare following surgery on the digestive system: Secondary | ICD-10-CM | POA: Diagnosis not present

## 2014-03-20 DIAGNOSIS — I5022 Chronic systolic (congestive) heart failure: Secondary | ICD-10-CM | POA: Diagnosis not present

## 2014-03-20 DIAGNOSIS — I340 Nonrheumatic mitral (valve) insufficiency: Secondary | ICD-10-CM | POA: Diagnosis not present

## 2014-03-20 DIAGNOSIS — I429 Cardiomyopathy, unspecified: Secondary | ICD-10-CM | POA: Diagnosis not present

## 2014-03-20 DIAGNOSIS — I10 Essential (primary) hypertension: Secondary | ICD-10-CM | POA: Diagnosis not present

## 2014-03-20 NOTE — Telephone Encounter (Signed)
Pt left v/m;pt saw lab results on mychart and is willing to start on a statin drug but pt does not want to take lipitor. [facility] request cb at [phone] when med sent to pharmacy.

## 2014-03-21 MED ORDER — SIMVASTATIN 40 MG PO TABS: 40.0000 mg | ORAL_TABLET | Freq: Every day | ORAL | Status: DC

## 2014-03-21 NOTE — Telephone Encounter (Signed)
Noted. Simvastatin (zocor) sent in to pharmacy. plz notify patient

## 2014-03-23 NOTE — Telephone Encounter (Signed)
Patient notified

## 2014-03-24 DIAGNOSIS — I340 Nonrheumatic mitral (valve) insufficiency: Secondary | ICD-10-CM | POA: Diagnosis not present

## 2014-03-24 DIAGNOSIS — K5780 Diverticulitis of intestine, part unspecified, with perforation and abscess without bleeding: Secondary | ICD-10-CM | POA: Diagnosis not present

## 2014-03-24 DIAGNOSIS — H9209 Otalgia, unspecified ear: Secondary | ICD-10-CM | POA: Diagnosis not present

## 2014-03-24 DIAGNOSIS — Z48815 Encounter for surgical aftercare following surgery on the digestive system: Secondary | ICD-10-CM | POA: Diagnosis not present

## 2014-03-24 DIAGNOSIS — H9193 Unspecified hearing loss, bilateral: Secondary | ICD-10-CM | POA: Diagnosis not present

## 2014-03-24 DIAGNOSIS — I10 Essential (primary) hypertension: Secondary | ICD-10-CM | POA: Diagnosis not present

## 2014-03-24 DIAGNOSIS — I429 Cardiomyopathy, unspecified: Secondary | ICD-10-CM | POA: Diagnosis not present

## 2014-03-24 DIAGNOSIS — M542 Cervicalgia: Secondary | ICD-10-CM | POA: Diagnosis not present

## 2014-03-24 DIAGNOSIS — I5022 Chronic systolic (congestive) heart failure: Secondary | ICD-10-CM | POA: Diagnosis not present

## 2014-03-24 DIAGNOSIS — H9319 Tinnitus, unspecified ear: Secondary | ICD-10-CM | POA: Diagnosis not present

## 2014-03-30 ENCOUNTER — Encounter: Payer: Self-pay | Admitting: Family Medicine

## 2014-03-31 DIAGNOSIS — L821 Other seborrheic keratosis: Secondary | ICD-10-CM | POA: Diagnosis not present

## 2014-03-31 DIAGNOSIS — L570 Actinic keratosis: Secondary | ICD-10-CM | POA: Diagnosis not present

## 2014-04-03 ENCOUNTER — Other Ambulatory Visit: Payer: Medicare Other

## 2014-04-03 DIAGNOSIS — I10 Essential (primary) hypertension: Secondary | ICD-10-CM | POA: Diagnosis not present

## 2014-04-03 DIAGNOSIS — I429 Cardiomyopathy, unspecified: Secondary | ICD-10-CM | POA: Diagnosis not present

## 2014-04-03 DIAGNOSIS — K5780 Diverticulitis of intestine, part unspecified, with perforation and abscess without bleeding: Secondary | ICD-10-CM | POA: Diagnosis not present

## 2014-04-03 DIAGNOSIS — Z48815 Encounter for surgical aftercare following surgery on the digestive system: Secondary | ICD-10-CM | POA: Diagnosis not present

## 2014-04-03 DIAGNOSIS — I5022 Chronic systolic (congestive) heart failure: Secondary | ICD-10-CM | POA: Diagnosis not present

## 2014-04-03 DIAGNOSIS — I340 Nonrheumatic mitral (valve) insufficiency: Secondary | ICD-10-CM | POA: Diagnosis not present

## 2014-04-06 ENCOUNTER — Telehealth: Payer: Self-pay | Admitting: Internal Medicine

## 2014-04-06 NOTE — Telephone Encounter (Signed)
Patient states she is having problems with constipation. She has had to have multiple surgeries for "abcess" this year. Dr. Gershon Crane did her surgeries. He wants he to see her GI for constipation. Offered OV with extender but she prefers to see Dr. Olevia Perches. Offered 05/11/14 but is cannot do this either. Scheduled on 05/19/14 at 10:00 AM.

## 2014-04-07 ENCOUNTER — Other Ambulatory Visit: Payer: Medicare Other

## 2014-04-07 ENCOUNTER — Encounter: Payer: Self-pay | Admitting: *Deleted

## 2014-04-14 ENCOUNTER — Other Ambulatory Visit: Payer: Self-pay

## 2014-04-14 ENCOUNTER — Telehealth: Payer: Self-pay

## 2014-04-14 ENCOUNTER — Other Ambulatory Visit (INDEPENDENT_AMBULATORY_CARE_PROVIDER_SITE_OTHER): Payer: Medicare Other

## 2014-04-14 DIAGNOSIS — R0789 Other chest pain: Secondary | ICD-10-CM

## 2014-04-14 DIAGNOSIS — I5032 Chronic diastolic (congestive) heart failure: Secondary | ICD-10-CM

## 2014-04-14 DIAGNOSIS — I429 Cardiomyopathy, unspecified: Secondary | ICD-10-CM

## 2014-04-14 NOTE — Telephone Encounter (Signed)
Pt left v/m; pt has been taking simvastatin for approx 1 month; now pt having leg cramps and pain in arms and joints; pt having difficulty going up steps. Pt has previously tried lipitor and crestor. Pt thinks needs different med for cholesterol and pt request cb.[facility].

## 2014-04-15 MED ORDER — ATORVASTATIN CALCIUM 40 MG PO TABS: 40.0000 mg | ORAL_TABLET | Freq: Every day | ORAL | Status: DC

## 2014-04-15 MED ORDER — DOCUSATE SODIUM 100 MG PO CAPS: 100.0000 mg | ORAL_CAPSULE | Freq: Two times a day (BID) | ORAL | Status: DC

## 2014-04-15 MED ORDER — POLYETHYLENE GLYCOL 3350 17 GM/SCOOP PO POWD: 17.0000 g | Freq: Every day | ORAL | Status: DC | PRN

## 2014-04-15 NOTE — Telephone Encounter (Signed)
lipitor set to pharmacy. Recommend she start colace stool softener 100mg  daily, may try miralax 17gm in 8oz water daily as well

## 2014-04-15 NOTE — Telephone Encounter (Signed)
Would she be willing to try brand lipitor? If not would try another brand statin (pitavastatin)

## 2014-04-15 NOTE — Telephone Encounter (Signed)
Spoke with patient and she is willing to try brand name lipitor. She also asks what you suggest for her BM's. She is still having to use the natural laxative and says "it is still mostly like trying to poop out hard cement and then I will have an explosion that is horrible and then I will go right back to cement, and I can't get into to GI until January." She said she is drinking plenty of water and eating fiber when questioned.

## 2014-04-16 NOTE — Telephone Encounter (Signed)
Patient notified and verbalized understanding. 

## 2014-04-23 ENCOUNTER — Encounter (HOSPITAL_COMMUNITY): Payer: Self-pay | Admitting: Internal Medicine

## 2014-04-27 ENCOUNTER — Telehealth: Payer: Self-pay | Admitting: Cardiovascular Disease

## 2014-04-27 DIAGNOSIS — I5032 Chronic diastolic (congestive) heart failure: Secondary | ICD-10-CM | POA: Diagnosis not present

## 2014-04-27 DIAGNOSIS — I429 Cardiomyopathy, unspecified: Secondary | ICD-10-CM | POA: Diagnosis not present

## 2014-04-27 NOTE — Telephone Encounter (Signed)
Pt needs refill on Carvedilol , but pharmacy said she can't get any more unitl closer to end of the month. She only has enough for today and that is all. Please call patient.

## 2014-04-28 ENCOUNTER — Other Ambulatory Visit: Payer: Self-pay | Admitting: *Deleted

## 2014-04-28 ENCOUNTER — Telehealth: Payer: Self-pay | Admitting: *Deleted

## 2014-04-28 MED ORDER — CARVEDILOL 12.5 MG PO TABS: 12.5000 mg | ORAL_TABLET | Freq: Two times a day (BID) | ORAL | Status: DC

## 2014-04-28 NOTE — Telephone Encounter (Signed)
Refill sent for carvedilol 12.5 mg bid  #60 tablets #3 refills

## 2014-04-28 NOTE — Telephone Encounter (Signed)
LMTCB last refill sent for Carvedilol 12.5 BID on 03/2014 for 90 tablets pt should have had enough tablets to last for the whole month.

## 2014-04-28 NOTE — Telephone Encounter (Signed)
Patient mentioned that she was told to take her carvedilol 12.5 mg 1/2 tablet in the am 1/2 tablet @ noon and 1/2 tablet @ bedtime by Dr. [name]. Pt was last sent a Rx 03/18/2014 with a refill for carvedilol 12.5 bid #90 tablets. Pt mentioned that pharmacy will not refill carvedilol due to insurance purposes. Pt does not know if she was not given enough or pills were miscounted but she ended up not having enough. I do not see where directions were changed to how she described she was told she should be taking her carvedilol other than last telephone note carvedilol 12.5 mg bid. Pt does not have any carvedilol and will be leaving out of town next week. Please advise.

## 2014-04-28 NOTE — Telephone Encounter (Signed)
Notified patient per telephone note below patient was instructed to take carvedilol 12.5 mg twice a day. Patient is aware of instructions and will pick up her new Rx for the carvedilol.   Spoke w/ pt. Advised her of Dr. Donivan Scull recommendation.  She verbalizes understanding and is agreeable. Order placed for ECHO and pt to schedule.              Ashley Merritts, MD at 03/17/2014 5:36 PM     Status: Signed       Expand All Collapse All   Would increase the carvedilol up to 12.5 mg twice a day This can help the heart rate slowed down Echocardiogram can be done but will not help heart rate Certainly if she would like, this can be ordered If she has anemia following the surgery, this will cause a higher heart rate            Ashley Bunting, RN at 03/17/2014 8:37 AM     Status: Signed       Expand All Collapse All   Spoke w/ pt. She reports that she had her colon reconstructive surgery on 02/10/14. Since then, her HR has been consistently in the 90s. Per pt, her PCP told her "your heart is running away" and her HR is too fast. She reports an increased amount of gas since her surgery and "soreness and pressure around my heart". She is requesting an ECHO and is unsure if she can get one at this time.  Please advise. Thank you.             Ashley [name] at 03/16/2014 9:21 AM     Status: Signed       Expand All Collapse All   Pt states she feels like her chest is bruised and sore around her heart. States on 9/29 she had

## 2014-04-29 ENCOUNTER — Telehealth: Payer: Self-pay

## 2014-04-29 ENCOUNTER — Ambulatory Visit (INDEPENDENT_AMBULATORY_CARE_PROVIDER_SITE_OTHER): Payer: Medicare Other | Admitting: *Deleted

## 2014-04-29 DIAGNOSIS — I5032 Chronic diastolic (congestive) heart failure: Secondary | ICD-10-CM

## 2014-04-29 DIAGNOSIS — I420 Dilated cardiomyopathy: Secondary | ICD-10-CM

## 2014-04-29 NOTE — Telephone Encounter (Signed)
Pharmacist states they need new rx for Carvedilol, pt states Dr. [name] has changed the dosage and needs directions. Please call.

## 2014-04-29 NOTE — Progress Notes (Signed)
Remote ICD transmission.   

## 2014-04-29 NOTE — Telephone Encounter (Signed)
Spoke w/ Tiffany.  Please see previous phone note.

## 2014-04-30 LAB — MDC_IDC_ENUM_SESS_TYPE_REMOTE
Battery Voltage: 3.12 V
Brady Statistic RV Percent Paced: 0.01 %
Date Time Interrogation Session: 20151214135424
HighPow Impedance: 66 Ohm
Lead Channel Impedance Value: 475 Ohm
Lead Channel Pacing Threshold Amplitude: 0.625 V
Lead Channel Pacing Threshold Pulse Width: 0.4 ms
Lead Channel Sensing Intrinsic Amplitude: 6.625 mV
Lead Channel Sensing Intrinsic Amplitude: 6.625 mV
Lead Channel Setting Pacing Amplitude: 2.5 V
Lead Channel Setting Pacing Pulse Width: 0.4 ms
Lead Channel Setting Sensing Sensitivity: 0.3 mV
Zone Setting Detection Interval: 300 ms
Zone Setting Detection Interval: 360 ms
Zone Setting Detection Interval: 450 ms

## 2014-05-01 ENCOUNTER — Encounter: Payer: Self-pay | Admitting: Cardiology

## 2014-05-11 ENCOUNTER — Encounter: Payer: Self-pay | Admitting: Internal Medicine

## 2014-05-19 ENCOUNTER — Ambulatory Visit (INDEPENDENT_AMBULATORY_CARE_PROVIDER_SITE_OTHER): Payer: Medicare Other | Admitting: Internal Medicine

## 2014-05-19 ENCOUNTER — Encounter: Payer: Self-pay | Admitting: Internal Medicine

## 2014-05-19 ENCOUNTER — Encounter: Payer: Self-pay | Admitting: Cardiology

## 2014-05-19 VITALS — BP 102/58 | HR 68 | Ht 65.5 in | Wt 161.1 lb

## 2014-05-19 DIAGNOSIS — K5732 Diverticulitis of large intestine without perforation or abscess without bleeding: Secondary | ICD-10-CM

## 2014-05-19 MED ORDER — AZITHROMYCIN 250 MG PO TABS: ORAL_TABLET | ORAL | Status: DC

## 2014-05-19 NOTE — Patient Instructions (Signed)
We have sent the following medications to your pharmacy for you to pick up at your convenience: ZPak  Please purchase the following medications over the counter and take as directed: Benefiber-1 heaping teaspoon daily Miralax 1/2 capful (9 grams) daily  Please take your Probiotic once daily .  Please follow up with Dr Olevia Perches in 3 months.  CC:Dr Danise Mina

## 2014-05-19 NOTE — Progress Notes (Signed)
Ashley [name] 03-30-1946 [id number]  Note: This dictation was prepared with Dragon digital system. Any transcriptional errors that result from this procedure are unintentional.   History of Present Illness:  This is a 69 year old white female who had perforated diverticulitis in May 2015 and underwent  partial colectomy and Hartmann's procedure after failed  percutaneous drainage of a 7.5 cm pelvic abscess in May 2015. She was hospitalized in [place]. A colostomy was reversed by Dr.Tsuei in September 2015. She is struggling with irregular bowel habits, mostly constipation and ongoing lower abdominal pain.Marland Kitchen Her last colonoscopy in August 2005 showed severe sigmoid diverticulosis with partial obstruction. She is taking Miralax one half capful a day and  low fiber diet. There has been no fever , she has continuous soreness in her lower  abdomen. Her overall level of energy has been low. She does not exercise. She has been followed by Dr.Gollen for heart disease and has a defibrillator. She no longer takes pain medications.  Weight loss during hospitalization was 40 pounds, from 180 lbs to 140 pounds. She is currently up to 160 pounds.      Past Medical History  Diagnosis Date  . Migraine without aura, without mention of intractable migraine without mention of status migrainosus   . History of diverticulitis of colon   . Pure hypercholesterolemia   . Irritable bowel syndrome   . Laryngeal nodule     Dr. Thomasena Edis  . Fibrocystic breast disease   . Hemorrhoids   . Anxiety   . GERD (gastroesophageal reflux disease)   . Systolic CHF     EF 54-65%, LV dilation, significant MR, goal weight 150lbs  . Nonischemic cardiomyopathy   . History of pneumonia 2012  . Mitral regurgitation   . Anxiety   . Bronchiectasis     possibly mild, treat URIs aggressively  . Hypertension   . Heart murmur   . Angina   . Lung disease 07/14/11  . Shortness of breath     "resting"  . Anemia   .  Fibromyalgia   . Depression   . Disorder of vocal cords   . Chronic sinusitis   . Insomnia   . Osteoporosis 01/2013  . sigmoid diverticulitis with perforation, abscess and fistula 09/30/2013  . H/O multiple allergies 10/10/2013  . PONV (postoperative nausea and vomiting)   . CHF (congestive heart failure) 2011  . Pneumonia 2011  . Pacemaker   . Automatic implantable cardioverter-defibrillator in situ 2012  . Osteoarthritis     knees, back, hands, low back,   . Cancer     basal cell, arms & neck  . Sleep apnea 2010    borderline, inconclusive- /w [place]   . Hemorrhoids     Past Surgical History  Procedure Laterality Date  . Appendectomy    . Mandible surgery      "got about 6 pins in the bottom of my jaw"  . Knee arthroscopy w/ orif      Left; "meniscus tear"  . Osteotomy      Left foot  . 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 Dexa  03/28/99    osteopenia L/S  . Colonoscopy  1998    nml (sigmoid-Gilbert-nol)  . Head mri, head ct  10/1998    ? H/A focus ([facility])  . Emg/mcv  10/16/01    + mild carpal tunnel  . Dexa  03/12/03    2.2 Spine 0.1 Hip  Osteopenia  .  Mri right hip  12/12/01    Tendonitis Gluteus Medius to Gtr Trochanter Neg Bursitis (Dr. Eulas Post)  . L/s films  11/21/01    nml; Right hip nml  . Mri u/s  11/21/01    min. DDD L/3-4  . Colonoscopy  01/04/04    polyps, diverticulosis/severe  . Dexa  11/14/05    1.9 Spine 0.3 Hip  slight improvement  . Flex laryngoscopy  06/11/06    ([facility]) nml  . Dexa  11/22/06    improved osteo and fem neck  . [facility] bunionectomy  11/04/08    Right Great Toe (Dr. Beola Cord)  . Breast mass excision  01/2010    Left-fibrocystic change w/intraductal papilloma, no malignancy  . Ct chest  12/2010    possible bronchiectasis lower lobes, 02/2011 - enlarged bilateral effusions, bibasilar atx  . Stress myoview  02/2011    no significant ischemia  . Ct chest  04/2011    no PE.  abnormal R hilar and mediastinal adenopathy --> on rpt  05/2011, resolved  . US echocardiography  04/2011    mildly dilated LV, EF 25-30%, hypokinesis, restrictive physiology, mod MR, no AS  . Icd placement  07/14/11    w/pacemaker  . Tonsillectomy and adenoidectomy    . Breast biopsy      "I've had 2-3 bx; all benign"  . Augmentation mammaplasty    . 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 Abdominal hysterectomy  1987    w/BSO  . Lasik      left eye  . Knee surgery      left  . Mandible surgery    . Sinus surgery      x3 with balloon   . Dexa  01/2013    T score -2.6 at spine  . Partial colectomy N/A 10/02/2013    Procedure: PARTIAL COLECTOMY;  Surgeon: Imogene Burn. Georgette Dover, MD;  Location: [facility];  Service: General;  Laterality: N/A;  . Colostomy N/A 10/02/2013    Procedure: DESCENDING COLOSTOMY;  Surgeon: Imogene Burn. Georgette Dover, MD;  Location: [facility];  Service: General;  Laterality: N/A;  . Colostomy reversal  02/10/2014    dr Georgette Dover  . Colostomy reversal N/A 02/10/2014    Procedure: COLOSTOMY REVERSAL;  Surgeon: [name], MD;  Location: [facility];  Service: General;  Laterality: N/A;  . Implantable cardioverter defibrillator implant N/A 07/14/2011    Procedure: IMPLANTABLE CARDIOVERTER DEFIBRILLATOR IMPLANT;  Surgeon: Evans Lance, MD;  Location: [facility] CATH LAB;  Service: Cardiovascular;  Laterality: N/A;    Allergies  Allergen Reactions  . Antihistamines, Chlorpheniramine-Type Other (See Comments)    Makes her eyes look like she sees strobe lights  . Influenza Vac Split [Flu Virus Vaccine] Other (See Comments)    "allergic to concentrated eggs that are in vaccine"  . Penicillins     REACTION: "told 50 years ago I couldn't take it; might be immune to it", able to take amoxicillin  . Sulfasalazine Other (See Comments)    "makes my whole body smell like sulfa & makes me sick just smelling it"  . Atorvastatin Other (See Comments)    Severe muscle cramps to generic atorvastatin.  Able to take brand lipitor.   . Latex Nausea Only and Rash  . Rosuvastatin Other (See Comments)    Was not effective controlling lipids  . Chlorhexidine Other (See Comments)    blisters  . Codeine Other (See Comments)  Brain not clear  . Cyclobenzaprine Other (See Comments)    Adverse reaction - back and throat pain, dizziness, exhaustion  . Cymbalta [Duloxetine Hcl] Other (See Comments)    "body spasms and made me feel weird in my head"   . Dilaudid [Hydromorphone Hcl]     Feels like she is burning internally   . Metaxalone Other (See Comments)    REACTION: unknown  . Morphine And Related     Internally feels like she is on fire   . Pravastatin Other (See Comments)    myalgias  . Prevacid [Lansoprazole] Other (See Comments)    Worsened GI side effects  . Red Dye     Nausea, swelling  . Simvastatin Other (See Comments)    Leg cramps  . Tape     All adhesives     Family history and social history have been reviewed.  Review of Systems: Difficult stool evacuation. Abdominal tenderness. Denies nausea vomiting or fever he has sore throat and thrush   The remainder of the 10 point ROS is negative except as outlined in the H&P  Physical Exam: General Appearance Well developed, in no distress Eyes  Non icteric  HEENT  Non traumatic, normocephalic  Mouth No lesion, tongue papillated, no cheilosis Neck Supple without adenopathy, thyroid not enlarged, no carotid bruits, no JVD Lungs Clear to auscultation bilaterally COR Normal S1, normal S2, regular rhythm, no murmur, quiet precordium Abdomen  healed scar below the umbilicus healed by secondary closure.. Diffuse tenderness mostly in the left lower and left middle quadrants. Normoactive bowel sounds. No tympany. Liver edge at costal margin. No rebound  Rectal  soft Hemoccult positive stool  Extremities  No pedal edema Skin No lesions Neurological Alert and oriented x 3 Psychological Normal mood and affect  Assessment and Plan:   Problem #67  69 year old white female who is recovering from perforated diverticulitis, colostomy and reversal of colostomy 4 months ago. She is still not up to her normal level of energy and bowel habits. She has  mild problems with pelvic floor dysfunction and evacuation. I have advised her to start doing [place] exercises. She will continue Miralax one half capful daily and probiotics. She will also add fiber, 1 heaping teaspoon daily. I told her that it is likely to take 1 year from the surgery for her to feel back to her baseline. I will see her in 3 months.  Heme positive stool, will need to be evaluated with colonoscopy when  She  Is feeling better.probably in next few months. OV in 3 months.  Problem #2 Upper respiratory infection. We will give her a Z-Pak. She will continue Magic mouthwash for thrush.     Delfin Edis 05/19/2014

## 2014-05-21 ENCOUNTER — Telehealth: Payer: Self-pay | Admitting: Family Medicine

## 2014-05-21 DIAGNOSIS — E7800 Pure hypercholesterolemia, unspecified: Secondary | ICD-10-CM

## 2014-05-21 DIAGNOSIS — M791 Myalgia, unspecified site: Secondary | ICD-10-CM

## 2014-05-21 DIAGNOSIS — D500 Iron deficiency anemia secondary to blood loss (chronic): Secondary | ICD-10-CM

## 2014-05-21 NOTE — Telephone Encounter (Signed)
Pt called and would like to have cholosterol checked due to legs cramping so bad. Is this ok or does pt need to come in to discuss? Possibly change in Lipitor medication per pt. Bones and calf of legs are sore. Please advise.  [phone]

## 2014-05-25 NOTE — Telephone Encounter (Signed)
plz verify if pt currently taking brand lipitor or if she has tolerated it in the past. Looks like she's taking currently atorvastatin (Generic.). If she's tried brand lipitor, would offer trial of pitavastatin another brand statin.

## 2014-05-25 NOTE — Telephone Encounter (Signed)
Before I call patient-per 04-14-14 note, you put her on name brand Lipitor at that time. Am I mistaken?

## 2014-05-25 NOTE — Telephone Encounter (Signed)
Patient called and asked if you would call her back about having lab work done.  Please call her back at [phone].

## 2014-05-25 NOTE — Telephone Encounter (Signed)
Spoke with patient. She is taking the generic because the name brand was too expensive. She opted to try QOD dosing for now. Med list updated. Lab appt scheduled.

## 2014-05-25 NOTE — Telephone Encounter (Signed)
I'm sorry she continues to have trouble with atorvastatin. Ok to come in for lab check. Labs ordered. If interested and not tried previously, I would offer trial of brand name lipitor - we have coupons available. Alternatively we could do every other day dosing. Lab Results  Component Value Date   CHOL 338* 03/13/2014   HDL 46.30 03/13/2014   LDLCALC 82 01/24/2011   LDLDIRECT 240.9 03/13/2014   TRIG 214.0* 03/13/2014   CHOLHDL 7 03/13/2014

## 2014-05-25 NOTE — Telephone Encounter (Signed)
We do have coupons for brand lipitor.

## 2014-05-27 ENCOUNTER — Emergency Department (HOSPITAL_COMMUNITY): Payer: Medicare Other

## 2014-05-27 ENCOUNTER — Inpatient Hospital Stay (HOSPITAL_COMMUNITY)
Admission: EM | Admit: 2014-05-27 | Discharge: 2014-05-29 | DRG: 872 | Disposition: A | Discharge status: Home / Self Care | Payer: Medicare Other | Attending: Internal Medicine | Admitting: Internal Medicine

## 2014-05-27 ENCOUNTER — Encounter (HOSPITAL_COMMUNITY): Payer: Self-pay | Admitting: Emergency Medicine

## 2014-05-27 ENCOUNTER — Other Ambulatory Visit (INDEPENDENT_AMBULATORY_CARE_PROVIDER_SITE_OTHER): Payer: Medicare Other

## 2014-05-27 ENCOUNTER — Telehealth: Payer: Self-pay

## 2014-05-27 DIAGNOSIS — R1110 Vomiting, unspecified: Secondary | ICD-10-CM

## 2014-05-27 DIAGNOSIS — M791 Myalgia, unspecified site: Secondary | ICD-10-CM

## 2014-05-27 DIAGNOSIS — F419 Anxiety disorder, unspecified: Secondary | ICD-10-CM | POA: Diagnosis present

## 2014-05-27 DIAGNOSIS — A419 Sepsis, unspecified organism: Principal | ICD-10-CM | POA: Diagnosis present

## 2014-05-27 DIAGNOSIS — R1030 Lower abdominal pain, unspecified: Secondary | ICD-10-CM | POA: Diagnosis present

## 2014-05-27 DIAGNOSIS — Z885 Allergy status to narcotic agent status: Secondary | ICD-10-CM

## 2014-05-27 DIAGNOSIS — D500 Iron deficiency anemia secondary to blood loss (chronic): Secondary | ICD-10-CM

## 2014-05-27 DIAGNOSIS — N390 Urinary tract infection, site not specified: Secondary | ICD-10-CM | POA: Diagnosis not present

## 2014-05-27 DIAGNOSIS — A084 Viral intestinal infection, unspecified: Secondary | ICD-10-CM | POA: Diagnosis not present

## 2014-05-27 DIAGNOSIS — R1084 Generalized abdominal pain: Secondary | ICD-10-CM | POA: Diagnosis present

## 2014-05-27 DIAGNOSIS — E7800 Pure hypercholesterolemia, unspecified: Secondary | ICD-10-CM

## 2014-05-27 DIAGNOSIS — E785 Hyperlipidemia, unspecified: Secondary | ICD-10-CM | POA: Diagnosis present

## 2014-05-27 DIAGNOSIS — M810 Age-related osteoporosis without current pathological fracture: Secondary | ICD-10-CM | POA: Diagnosis present

## 2014-05-27 DIAGNOSIS — K5900 Constipation, unspecified: Secondary | ICD-10-CM | POA: Diagnosis present

## 2014-05-27 DIAGNOSIS — I5042 Chronic combined systolic (congestive) and diastolic (congestive) heart failure: Secondary | ICD-10-CM | POA: Diagnosis present

## 2014-05-27 DIAGNOSIS — R112 Nausea with vomiting, unspecified: Secondary | ICD-10-CM | POA: Diagnosis not present

## 2014-05-27 DIAGNOSIS — J329 Chronic sinusitis, unspecified: Secondary | ICD-10-CM | POA: Diagnosis present

## 2014-05-27 DIAGNOSIS — N3289 Other specified disorders of bladder: Secondary | ICD-10-CM | POA: Diagnosis not present

## 2014-05-27 DIAGNOSIS — I129 Hypertensive chronic kidney disease with stage 1 through stage 4 chronic kidney disease, or unspecified chronic kidney disease: Secondary | ICD-10-CM | POA: Diagnosis present

## 2014-05-27 DIAGNOSIS — I428 Other cardiomyopathies: Secondary | ICD-10-CM | POA: Diagnosis not present

## 2014-05-27 DIAGNOSIS — I340 Nonrheumatic mitral (valve) insufficiency: Secondary | ICD-10-CM | POA: Diagnosis present

## 2014-05-27 DIAGNOSIS — N183 Chronic kidney disease, stage 3 unspecified: Secondary | ICD-10-CM | POA: Diagnosis present

## 2014-05-27 DIAGNOSIS — Z880 Allergy status to penicillin: Secondary | ICD-10-CM

## 2014-05-27 DIAGNOSIS — K589 Irritable bowel syndrome without diarrhea: Secondary | ICD-10-CM | POA: Diagnosis present

## 2014-05-27 DIAGNOSIS — Z7982 Long term (current) use of aspirin: Secondary | ICD-10-CM

## 2014-05-27 DIAGNOSIS — R1111 Vomiting without nausea: Secondary | ICD-10-CM | POA: Diagnosis not present

## 2014-05-27 DIAGNOSIS — I429 Cardiomyopathy, unspecified: Secondary | ICD-10-CM | POA: Diagnosis present

## 2014-05-27 DIAGNOSIS — M1990 Unspecified osteoarthritis, unspecified site: Secondary | ICD-10-CM | POA: Diagnosis present

## 2014-05-27 DIAGNOSIS — D649 Anemia, unspecified: Secondary | ICD-10-CM | POA: Diagnosis present

## 2014-05-27 DIAGNOSIS — K5730 Diverticulosis of large intestine without perforation or abscess without bleeding: Secondary | ICD-10-CM | POA: Diagnosis not present

## 2014-05-27 DIAGNOSIS — I420 Dilated cardiomyopathy: Secondary | ICD-10-CM

## 2014-05-27 DIAGNOSIS — J381 Polyp of vocal cord and larynx: Secondary | ICD-10-CM | POA: Diagnosis present

## 2014-05-27 DIAGNOSIS — R109 Unspecified abdominal pain: Secondary | ICD-10-CM

## 2014-05-27 DIAGNOSIS — F329 Major depressive disorder, single episode, unspecified: Secondary | ICD-10-CM | POA: Diagnosis present

## 2014-05-27 DIAGNOSIS — G4700 Insomnia, unspecified: Secondary | ICD-10-CM | POA: Diagnosis present

## 2014-05-27 DIAGNOSIS — R197 Diarrhea, unspecified: Secondary | ICD-10-CM

## 2014-05-27 DIAGNOSIS — K219 Gastro-esophageal reflux disease without esophagitis: Secondary | ICD-10-CM | POA: Diagnosis present

## 2014-05-27 DIAGNOSIS — I10 Essential (primary) hypertension: Secondary | ICD-10-CM | POA: Diagnosis not present

## 2014-05-27 DIAGNOSIS — G4730 Sleep apnea, unspecified: Secondary | ICD-10-CM | POA: Diagnosis present

## 2014-05-27 DIAGNOSIS — N289 Disorder of kidney and ureter, unspecified: Secondary | ICD-10-CM | POA: Diagnosis present

## 2014-05-27 DIAGNOSIS — G43009 Migraine without aura, not intractable, without status migrainosus: Secondary | ICD-10-CM | POA: Diagnosis present

## 2014-05-27 DIAGNOSIS — Z95810 Presence of automatic (implantable) cardiac defibrillator: Secondary | ICD-10-CM | POA: Diagnosis present

## 2014-05-27 DIAGNOSIS — E861 Hypovolemia: Secondary | ICD-10-CM | POA: Diagnosis present

## 2014-05-27 DIAGNOSIS — N281 Cyst of kidney, acquired: Secondary | ICD-10-CM | POA: Diagnosis not present

## 2014-05-27 LAB — URINALYSIS, ROUTINE W REFLEX MICROSCOPIC
Bilirubin Urine: NEGATIVE
Glucose, UA: NEGATIVE mg/dL
Hgb urine dipstick: NEGATIVE
Ketones, ur: NEGATIVE mg/dL
Nitrite: NEGATIVE
Protein, ur: NEGATIVE mg/dL
Specific Gravity, Urine: 1.025 (ref 1.005–1.030)
Urobilinogen, UA: 0.2 mg/dL (ref 0.0–1.0)
pH: 5.5 (ref 5.0–8.0)

## 2014-05-27 LAB — CBC WITH DIFFERENTIAL/PLATELET
Basophils Absolute: 0 10*3/uL (ref 0.0–0.1)
Basophils Absolute: 0 10*3/uL (ref 0.0–0.1)
Basophils Relative: 0.4 % (ref 0.0–3.0)
Basophils Relative: 0 % (ref 0–1)
Eosinophils Absolute: 0.1 10*3/uL (ref 0.0–0.7)
Eosinophils Absolute: 0.1 10*3/uL (ref 0.0–0.7)
Eosinophils Relative: 0.8 % (ref 0.0–5.0)
Eosinophils Relative: 1 % (ref 0–5)
HCT: 35.5 % — ABNORMAL LOW (ref 36.0–46.0)
HCT: 36.5 % (ref 36.0–46.0)
Hemoglobin: 11.5 g/dL — ABNORMAL LOW (ref 12.0–15.0)
Hemoglobin: 12.1 g/dL (ref 12.0–15.0)
Lymphocytes Relative: 17.8 % (ref 12.0–46.0)
Lymphocytes Relative: 4 % — ABNORMAL LOW (ref 12–46)
Lymphs Abs: 1.8 10*3/uL (ref 0.7–4.0)
Lymphs Abs: 0.6 10*3/uL — ABNORMAL LOW (ref 0.7–4.0)
MCH: 28.7 pg (ref 26.0–34.0)
MCHC: 32.5 g/dL (ref 30.0–36.0)
MCHC: 33.2 g/dL (ref 30.0–36.0)
MCV: 86.9 fl (ref 78.0–100.0)
MCV: 86.5 fL (ref 78.0–100.0)
Monocytes Absolute: 0.7 10*3/uL (ref 0.1–1.0)
Monocytes Absolute: 0.5 10*3/uL (ref 0.1–1.0)
Monocytes Relative: 7.2 % (ref 3.0–12.0)
Monocytes Relative: 3 % (ref 3–12)
Neutro Abs: 7.4 10*3/uL (ref 1.4–7.7)
Neutro Abs: 13.8 10*3/uL — ABNORMAL HIGH (ref 1.7–7.7)
Neutrophils Relative %: 73.8 % (ref 43.0–77.0)
Neutrophils Relative %: 92 % — ABNORMAL HIGH (ref 43–77)
Platelets: 295 10*3/uL (ref 150.0–400.0)
Platelets: 178 10*3/uL (ref 150–400)
RBC: 4.08 Mil/uL (ref 3.87–5.11)
RBC: 4.22 MIL/uL (ref 3.87–5.11)
RDW: 14.2 % (ref 11.5–15.5)
RDW: 13.8 % (ref 11.5–15.5)
WBC: 10.1 10*3/uL (ref 4.0–10.5)
WBC: 15 10*3/uL — ABNORMAL HIGH (ref 4.0–10.5)

## 2014-05-27 LAB — LIPID PANEL
Cholesterol: 177 mg/dL (ref 0–200)
HDL: 44.2 mg/dL (ref >39.00)
LDL Cholesterol: 109 mg/dL — ABNORMAL HIGH (ref 0–99)
NonHDL: 132.8
Total CHOL/HDL Ratio: 4
Triglycerides: 121 mg/dL (ref 0.0–149.0)
VLDL: 24.2 mg/dL (ref 0.0–40.0)

## 2014-05-27 LAB — COMPREHENSIVE METABOLIC PANEL
ALT: 16 U/L (ref 0–35)
ALT: 21 U/L (ref 0–35)
AST: 17 U/L (ref 0–37)
AST: 28 U/L (ref 0–37)
Albumin: 4.4 g/dL (ref 3.5–5.2)
Albumin: 4.4 g/dL (ref 3.5–5.2)
Alkaline Phosphatase: 110 U/L (ref 39–117)
Alkaline Phosphatase: 109 U/L (ref 39–117)
Anion gap: 18 — ABNORMAL HIGH (ref 5–15)
BUN: 26 mg/dL — ABNORMAL HIGH (ref 6–23)
BUN: 22 mg/dL (ref 6–23)
CO2: 27 mEq/L (ref 19–32)
CO2: 21 mmol/L (ref 19–32)
Calcium: 9.9 mg/dL (ref 8.4–10.5)
Calcium: 10.1 mg/dL (ref 8.4–10.5)
Chloride: 107 mEq/L (ref 96–112)
Chloride: 104 mEq/L (ref 96–112)
Creatinine, Ser: 0.93 mg/dL (ref 0.40–1.20)
Creatinine, Ser: 0.98 mg/dL (ref 0.50–1.10)
GFR calc Af Amer: 67 mL/min — ABNORMAL LOW (ref >90)
GFR calc non Af Amer: 58 mL/min — ABNORMAL LOW (ref >90)
GFR: 63.56 mL/min (ref >60.00)
Glucose, Bld: 100 mg/dL — ABNORMAL HIGH (ref 70–99)
Glucose, Bld: 103 mg/dL — ABNORMAL HIGH (ref 70–99)
Potassium: 4.5 mEq/L (ref 3.5–5.1)
Potassium: 4.3 mmol/L (ref 3.5–5.1)
Sodium: 141 mEq/L (ref 135–145)
Sodium: 143 mmol/L (ref 135–145)
Total Bilirubin: 0.2 mg/dL (ref 0.2–1.2)
Total Bilirubin: 0.5 mg/dL (ref 0.3–1.2)
Total Protein: 8.2 g/dL (ref 6.0–8.3)
Total Protein: 8.1 g/dL (ref 6.0–8.3)

## 2014-05-27 LAB — URINE MICROSCOPIC-ADD ON

## 2014-05-27 LAB — CK: Total CK: 103 U/L (ref 7–177)

## 2014-05-27 LAB — LIPASE, BLOOD: Lipase: 40 U/L (ref 11–59)

## 2014-05-27 MED ORDER — IOHEXOL 300 MG/ML  SOLN
25.0000 mL | Freq: Once | INTRAMUSCULAR | Status: AC | PRN
  Administered 2014-05-27: 25 mL via ORAL

## 2014-05-27 MED ORDER — SODIUM CHLORIDE 0.9 % IV BOLUS (SEPSIS)
1000.0000 mL | Freq: Once | INTRAVENOUS | Status: AC
  Administered 2014-05-27: 1000 mL via INTRAVENOUS

## 2014-05-27 MED ORDER — ACETAMINOPHEN 325 MG PO TABS
650.0000 mg | ORAL_TABLET | Freq: Once | ORAL | Status: AC
  Administered 2014-05-27: 650 mg via ORAL
  Filled 2014-05-27: qty 2

## 2014-05-27 MED ORDER — IOHEXOL 300 MG/ML  SOLN: 100.0000 mL | Freq: Once | INTRAMUSCULAR | Status: AC | PRN

## 2014-05-27 MED ORDER — ONDANSETRON HCL 4 MG/2ML IJ SOLN
4.0000 mg | Freq: Once | INTRAMUSCULAR | Status: AC
  Administered 2014-05-27: 4 mg via INTRAVENOUS
  Filled 2014-05-27: qty 2

## 2014-05-27 NOTE — Telephone Encounter (Signed)
Patient Name: Ashley [name]  DOB: January 27, 1946    Nurse Assessment  Nurse: Martyn Ehrich RN, Felicia Date/Time (Eastern Time): 05/27/2014 3:56:29 PM  Confirm and document reason for call. If symptomatic, describe symptoms. ---Pt is having sharp pain where naval used to be. Pt has been vomiting since 12:30 pm today. Severe vomiting - golden like paste - no odor. Started week ago and they last 1/2 a second. Past May she had 4 abcesses and was in hospital 21 days and lost part of colon and it was resected. Diarrhea or constipation. Pt is on fiber and miralax. Saw Dr. Laretta Bolster GI 2 wks ago and said to hold the stool softener but put her on fiber to bulk up the stool and she said it may take her year to get back to NL and she will see her in 3 months to make sure she is not developing a hernia. She noted a little blood in stool. This am She had to push hard and was a little blood on Toilet paper this am. Upper abdomen is super sore. Reconstruction in mid Sept to reverse colostomy. No fever  Has the patient traveled out of the country within the last 30 days? ---No  Does the patient require triage? ---Yes  Related visit to physician within the last 2 weeks? ---Yes  Does the PT have any chronic conditions? (i.e. diabetes, asthma, etc.) ---Yes  List chronic conditions. ---see above     Guidelines    Guideline Title Affirmed Question Affirmed Notes  Chest Pain [1] Chest pain lasts > 5 minutes AND [2] history of heart disease (i.e., heart attack, bypass surgery, angina, angioplasty, CHF; not just a heart murmur)    Final Disposition User   Call EMS [facility] [place], RN, Felicia    Comments  pain above naval and below ribs is moderate  caller has a pacemaker  Lots of vomiting and tightness in chest

## 2014-05-27 NOTE — Telephone Encounter (Signed)
Pt left v/m ; pt has hx of colon surgery last year; last week pt having sharp pain where belly button used to be and pt began to vomit around 12 AM last night;pt has vomited 22 times. I called pt back and unable to reach pt; I spoke with pts husband and he said pt was on other line with [facility]. Pt will cb if needs something further.

## 2014-05-27 NOTE — Telephone Encounter (Signed)
Noted, if she is on the phone already with the clinic then I'll defer for now.  Please get an update tomorrow.

## 2014-05-27 NOTE — ED Notes (Addendum)
Per ems: Pt from home for eval of abd pain x1week and emesis that started today at 1230. EMS reports tenderness to palpation noted to middle of abdomen. Pt with hx of abdominal surgery and hx of diverticulitis. Pt denies any diarrhea or fevers. EMS noted unremarkable EKG.

## 2014-05-27 NOTE — ED Notes (Signed)
Resident at bedside.  

## 2014-05-27 NOTE — ED Provider Notes (Signed)
69 year old female with multiple surgeries for diverticulitis with abscess formation had onset today of crampy abdominal pain, nausea, vomiting. She did have a very small bowel movement and is passing flatus. Pain does subside after vomiting after passing flatus. No fever or chills. On exam, abdomen is soft but diffusely tender. I'll sounds are present. She will be sent for CT scan to evaluate for possible bowel obstruction and possible recurrent diverticulitis with abscess.  I saw and evaluated the patient, reviewed the resident's note and I agree with the findings and plan.   EKG Interpretation   Date/Time:  Wednesday May 27 2014 17:30:22 EST Ventricular Rate:  100 PR Interval:  139 QRS Duration: 95 QT Interval:  355 QTC Calculation: 458 R Axis:   35 Text Interpretation:  Age not entered, assumed to be  69 years old for  purpose of ECG interpretation Sinus tachycardia Low voltage, precordial  leads When compared with ECG of 09/30/2013, No significant change was found  Confirmed by [facility]  MD, Rhyen Mazariego ([id number]) on 05/27/2014 6:01:11 PM        Delora Fuel, MD 35/00/93 [id number]

## 2014-05-27 NOTE — ED Provider Notes (Signed)
CSN: [id number]     Arrival date & time 05/27/14  1722 History   First MD Initiated Contact with Patient 05/27/14 1847     Chief Complaint  Patient presents with  . Abdominal Pain  . Emesis     (Consider location/radiation/quality/duration/timing/severity/associated sxs/prior Treatment) Patient is a 69 y.o. female presenting with abdominal pain and vomiting.  Abdominal Pain Pain location:  Generalized Pain quality: cramping   Pain severity:  Moderate Onset quality:  Gradual Duration: off and on for long time, but this episode started today. Timing:  Constant Progression:  Waxing and waning Chronicity:  Recurrent Relieved by:  Nothing Worsened by:  Palpation and movement Ineffective treatments:  None tried Associated symptoms: chills, constipation (small BM this AM, had diarrhea a few days ago so has not been taking miralax), flatus, nausea and vomiting (22x today)   Associated symptoms: no chest pain, no cough, no diarrhea (no diarrhea prior to coming to ED. took miralax this afternoon (hadnt for last 2 days because of loose stool)), no dysuria, no fever, no melena, no shortness of breath, no sore throat, no vaginal bleeding and no vaginal discharge   Risk factors: being elderly and multiple surgeries (hx perf diverticuliitis, abscess, colectomy, ostomy with reversal)   Emesis Associated symptoms: abdominal pain and chills   Associated symptoms: no diarrhea (no diarrhea prior to coming to ED. took miralax this afternoon (hadnt for last 2 days because of loose stool)), no headaches and no sore throat     Past Medical History  Diagnosis Date  . Migraine without aura, without mention of intractable migraine without mention of status migrainosus   . History of diverticulitis of colon   . Pure hypercholesterolemia   . Irritable bowel syndrome   . Laryngeal nodule     Dr. Thomasena Edis  . Fibrocystic breast disease   . Hemorrhoids   . Anxiety   . GERD (gastroesophageal reflux disease)    . Systolic CHF     EF 93-23%, LV dilation, significant MR, goal weight 150lbs  . Nonischemic cardiomyopathy   . History of pneumonia 2012  . Mitral regurgitation   . Anxiety   . Bronchiectasis     possibly mild, treat URIs aggressively  . Hypertension   . Heart murmur   . Angina   . Lung disease 07/14/11  . Shortness of breath     "resting"  . Anemia   . Fibromyalgia   . Depression   . Disorder of vocal cords   . Chronic sinusitis   . Insomnia   . Osteoporosis 01/2013  . sigmoid diverticulitis with perforation, abscess and fistula 09/30/2013  . H/O multiple allergies 10/10/2013  . PONV (postoperative nausea and vomiting)   . CHF (congestive heart failure) 2011  . Pneumonia 2011  . Pacemaker   . Automatic implantable cardioverter-defibrillator in situ 2012  . Osteoarthritis     knees, back, hands, low back,   . Cancer     basal cell, arms & neck  . Sleep apnea 2010    borderline, inconclusive- /w [place]   . Hemorrhoids    Past Surgical History  Procedure Laterality Date  . Appendectomy    . Mandible surgery      "got about 6 pins in the bottom of my jaw"  . Knee arthroscopy w/ orif      Left; "meniscus tear"  . Osteotomy      Left foot  . 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  Dexa  03/28/99    osteopenia L/S  . Colonoscopy  1998    nml (sigmoid-Gilbert-nol)  . Head mri, head ct  10/1998    ? H/A focus ([facility])  . Emg/mcv  10/16/01    + mild carpal tunnel  . Dexa  03/12/03    2.2 Spine 0.1 Hip  Osteopenia  . Mri right hip  12/12/01    Tendonitis Gluteus Medius to Gtr Trochanter Neg Bursitis (Dr. Eulas Post)  . L/s films  11/21/01    nml; Right hip nml  . Mri u/s  11/21/01    min. DDD L/3-4  . Colonoscopy  01/04/04    polyps, diverticulosis/severe  . Dexa  11/14/05    1.9 Spine 0.3 Hip  slight improvement  . Flex laryngoscopy  06/11/06    ([place]) nml  . Dexa  11/22/06    improved osteo and fem neck  . [place] bunionectomy  11/04/08    Right Great Toe (Dr.  Beola Cord)  . Breast mass excision  01/2010    Left-fibrocystic change w/intraductal papilloma, no malignancy  . Ct chest  12/2010    possible bronchiectasis lower lobes, 02/2011 - enlarged bilateral effusions, bibasilar atx  . Stress myoview  02/2011    no significant ischemia  . Ct chest  04/2011    no PE.  abnormal R hilar and mediastinal adenopathy --> on rpt 05/2011, resolved  . US echocardiography  04/2011    mildly dilated LV, EF 25-30%, hypokinesis, restrictive physiology, mod MR, no AS  . Icd placement  07/14/11    w/pacemaker  . Tonsillectomy and adenoidectomy    . Breast biopsy      "I've had 2-3 bx; all benign"  . Augmentation mammaplasty    . 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 Abdominal hysterectomy  1987    w/BSO  . Lasik      left eye  . Knee surgery      left  . Mandible surgery    . Sinus surgery      x3 with balloon   . Dexa  01/2013    T score -2.6 at spine  . Partial colectomy N/A 10/02/2013    Procedure: PARTIAL COLECTOMY;  Surgeon: Imogene Burn. Georgette Dover, MD;  Location: [facility];  Service: General;  Laterality: N/A;  . Colostomy N/A 10/02/2013    Procedure: DESCENDING COLOSTOMY;  Surgeon: Imogene Burn. Georgette Dover, MD;  Location: [facility];  Service: General;  Laterality: N/A;  . Colostomy reversal  02/10/2014    dr Georgette Dover  . Colostomy reversal N/A 02/10/2014    Procedure: COLOSTOMY REVERSAL;  Surgeon: [name], MD;  Location: [facility];  Service: General;  Laterality: N/A;  . Implantable cardioverter defibrillator implant N/A 07/14/2011    Procedure: IMPLANTABLE CARDIOVERTER DEFIBRILLATOR IMPLANT;  Surgeon: Evans Lance, MD;  Location[facility] CATH LAB;  Service: Cardiovascular;  Laterality: N/A;   Family History  Problem Relation Age of Onset  . Lung cancer Father     + smoker  . Dementia Mother   . Osteoporosis Mother     Lumbar spine  . Stroke Mother     x 5 @ 56 YOA  . Arthritis Mother     hands  . Emphysema Mother   . Alcohol  abuse Paternal Grandfather   . Hypertension Maternal Grandfather     ?  . Stroke Paternal Grandfather     ?  Marland Kitchen Heart  disease Paternal Grandmother   . Heart disease Paternal Grandfather   . Colon polyps Father   . Colon cancer Neg Hx   . Esophageal cancer Neg Hx   . Gallbladder disease Neg Hx    History  Substance Use Topics  . Smoking status: Never Smoker   . Smokeless tobacco: Never Used     Comment: Lived with smokers x 23 yrs, smoked herself "for a week"  . Alcohol Use: No   OB History    No data available     Review of Systems  Constitutional: Positive for chills. Negative for fever.  HENT: Negative for sore throat.   Eyes: Negative for visual disturbance.  Respiratory: Negative for cough and shortness of breath.   Cardiovascular: Negative for chest pain.  Gastrointestinal: Positive for nausea, vomiting (22x today), abdominal pain, constipation (small BM this AM, had diarrhea a few days ago so has not been taking miralax) and flatus. Negative for diarrhea (no diarrhea prior to coming to ED. took miralax this afternoon (hadnt for last 2 days because of loose stool)) and melena.  Genitourinary: Negative for dysuria, vaginal bleeding, vaginal discharge and difficulty urinating.  Musculoskeletal: Negative for back pain and neck pain.  Skin: Negative for rash.  Neurological: Negative for syncope and headaches.      Allergies  Antihistamines, chlorpheniramine-type; Codeine; Cyclobenzaprine; Cymbalta; Influenza vac split; Penicillins; Sulfasalazine; Atorvastatin; Chlorhexidine; Dilaudid; Latex; Morphine and related; Pravastatin; Prevacid; Red dye; Rosuvastatin; Simvastatin; Metaxalone; and Tape  Home Medications   Prior to Admission medications   Medication Sig Start Date End Date Taking? Authorizing Provider  acetaminophen (TYLENOL) 325 MG tablet Take 325 mg by mouth 2 (two) times daily as needed for moderate pain.   Yes Historical Provider, MD  alendronate (FOSAMAX) 70 MG  tablet Take 70 mg by mouth every Thursday. Take with a full glass of water on an empty stomach.   Yes Historical Provider, MD  ALPRAZolam Duanne Moron) 0.25 MG tablet TAKE 1 TABLET BY MOUTH TWICE A DAY 02/17/14  Yes [name], MD  Ascorbic Acid (VITAMIN C) 1000 MG tablet Take 1,000 mg by mouth daily.   Yes Historical Provider, MD  aspirin EC 81 MG tablet Take 81 mg by mouth at bedtime.  11/28/13  Yes [name], MD  atorvastatin (LIPITOR) 40 MG tablet Take 1 tablet (40 mg total) by mouth daily. Patient taking differently: Take 40 mg by mouth every other day.  04/15/14  Yes [name], MD  BIOTIN 5000 PO Take 5,000 mcg by mouth daily.   Yes Historical Provider, MD  Calcium Carbonate-Vitamin D (CALCIUM + D PO) Take 1 tablet by mouth daily.   Yes Historical Provider, MD  carvedilol (COREG) 12.5 MG tablet Take 1 tablet (12.5 mg total) by mouth 2 (two) times daily with a meal. 04/28/14  Yes Minna Merritts, MD  Coenzyme Q10 (CO Q 10 PO) Take 1 tablet by mouth daily.   Yes Historical Provider, MD  dextromethorphan (DELSYM) 30 MG/5ML liquid Take 30 mg by mouth as needed for cough.   Yes Historical Provider, MD  Diphenhyd-Hydrocort-Nystatin (FIRST-DUKES MOUTHWASH MT) Use as directed 1 application in the mouth or throat as needed (for thrush).   Yes Historical Provider, MD  docusate sodium (COLACE) 100 MG capsule Take 1 capsule (100 mg total) by mouth 2 (two) times daily. 04/15/14  Yes [name], MD  omeprazole (PRILOSEC) 40 MG capsule Take 1 capsule (40 mg total) by mouth 2 (two) times daily before a meal. 01/29/14  Yes [name], MD  polyethylene glycol [facility] / GLYCOLAX) packet Take 9 g by mouth daily.   Yes Historical Provider, MD  PRESCRIPTION MEDICATION Place 1 application into both ears daily as needed (MED NAME: mometasone 0.1% SOLUTION (for eczema on ears)).   Yes Historical Provider, MD  simvastatin (ZOCOR) 40 MG tablet Take 40 mg by mouth daily.  03/21/14  Yes Historical  Provider, MD  spironolactone (ALDACTONE) 25 MG tablet Take 25 mg by mouth every morning.   Yes Historical Provider, MD  triamcinolone (NASACORT) 55 MCG/ACT AERO nasal inhaler Place 2 sprays into the nose 2 (two) times daily.   Yes Historical Provider, MD  vitamin E 100 UNIT capsule Take 100 Units by mouth daily.   Yes Historical Provider, MD  Wheat Dextrin (BENEFIBER DRINK MIX PO) Take 1 application by mouth daily.   Yes Historical Provider, MD  azithromycin (ZITHROMAX Z-PAK) 250 MG tablet Take as directed 05/19/14   Lafayette Dragon, MD  torsemide (DEMADEX) 10 MG tablet Take 10 mg by mouth daily as needed (for water retention).     Historical Provider, MD   BP 122/59 mmHg  Pulse 101  Temp(Src) 98 F (36.7 C) (Oral)  Resp 18  Ht 5\' 5"  (1.651 m)  Wt 157 lb (71.215 kg)  BMI 26.13 kg/m2  SpO2 99% Physical Exam  Constitutional: She is oriented to person, place, and time. She appears well-developed and well-nourished. No distress.  HENT:  Head: Normocephalic and atraumatic.  Mouth/Throat: Mucous membranes are dry.  Eyes: Conjunctivae and EOM are normal.  Neck: Normal range of motion.  Cardiovascular: Normal rate, regular rhythm, normal heart sounds and intact distal pulses.  Exam reveals no gallop and no friction rub.   No murmur heard. Pulmonary/Chest: Effort normal and breath sounds normal. No respiratory distress. She has no wheezes. She has no rales.  Abdominal: Soft. She exhibits no distension. There is tenderness (diffuse however worse in LLQ) in the left lower quadrant. There is guarding.  Musculoskeletal: She exhibits no edema or tenderness.  Neurological: She is alert and oriented to person, place, and time.  Skin: Skin is warm and dry. No rash noted. She is not diaphoretic. No erythema.  Nursing note and vitals reviewed.   ED Course  Procedures (including critical care time) Labs Review Labs Reviewed  CBC WITH DIFFERENTIAL - Abnormal; Notable for the following:    WBC 15.0 (*)     Neutrophils Relative % 92 (*)    Neutro Abs 13.8 (*)    Lymphocytes Relative 4 (*)    Lymphs Abs 0.6 (*)    All other components within normal limits  COMPREHENSIVE METABOLIC PANEL - Abnormal; Notable for the following:    Glucose, Bld 103 (*)    GFR calc non Af Amer 58 (*)    GFR calc Af Amer 67 (*)    Anion gap 18 (*)    All other components within normal limits  LIPASE, BLOOD  URINALYSIS, ROUTINE W REFLEX MICROSCOPIC    Imaging Review No results found.   EKG Interpretation   Date/Time:  Wednesday May 27 2014 17:30:22 EST Ventricular Rate:  100 PR Interval:  139 QRS Duration: 95 QT Interval:  355 QTC Calculation: 458 R Axis:   35 Text Interpretation:  Age not entered, assumed to be  70 years old for  purpose of ECG interpretation Sinus tachycardia Low voltage, precordial  leads When compared with ECG of 09/30/2013, No significant change was found  Confirmed by [facility]  MD, DAVID ([id number]) on 05/27/2014 6:01:11 PM      MDM   Final diagnoses:  None   69 year old female with a history of perforated diverticulitis status post resection with ostomy and reversal complicated by intra-abdominal abscesses, nonischemic cardiomyopathy with ICD in place, CK D, hypertension, hyperlipidemia presents with concern of nausea, vomiting, abdominal pain and diarrhea.  Given patient's tenderness on exam, history of multiple surgeries with intra-abdominal abscesses patient had a CT scan which was done. CT showed no signs of obstruction or diverticulitis or acute intra-abdominal pathology. CT did question under distention of the bladder versus bladder wall thickening and given patient does not have urinary symptoms or sign of urinary tract infection on urinalysis have low suspicion for cystitis. Lipase within normal limits.  Patient with more persistent diarrhea in the emergency department.  She was given 2 boluses of normal saline throughout her stay, however her blood pressures continued to  decrease.  Patient reports she has baseline low blood pressures, and denied any lightheadedness and initial plan was to discharge patient with close PCP follow-up and Zofran for nausea. However, patient had worsening hypotension prior to time of discharge, continuing diarrhea, and given tenuous fluid status with history of cardiomyopathy and relative hypotension will admit for continued care to hospitalist.   Gareth Morgan, MD 96/22/29 [id number]  Delora Fuel, MD 21/19/41 7408

## 2014-05-28 ENCOUNTER — Telehealth: Payer: Self-pay | Admitting: Cardiovascular Disease

## 2014-05-28 ENCOUNTER — Encounter (HOSPITAL_COMMUNITY): Payer: Self-pay | Admitting: *Deleted

## 2014-05-28 DIAGNOSIS — E785 Hyperlipidemia, unspecified: Secondary | ICD-10-CM | POA: Diagnosis present

## 2014-05-28 DIAGNOSIS — K589 Irritable bowel syndrome without diarrhea: Secondary | ICD-10-CM | POA: Diagnosis present

## 2014-05-28 DIAGNOSIS — I5042 Chronic combined systolic (congestive) and diastolic (congestive) heart failure: Secondary | ICD-10-CM | POA: Diagnosis present

## 2014-05-28 DIAGNOSIS — I129 Hypertensive chronic kidney disease with stage 1 through stage 4 chronic kidney disease, or unspecified chronic kidney disease: Secondary | ICD-10-CM | POA: Diagnosis present

## 2014-05-28 DIAGNOSIS — R1030 Lower abdominal pain, unspecified: Secondary | ICD-10-CM | POA: Diagnosis present

## 2014-05-28 DIAGNOSIS — Z880 Allergy status to penicillin: Secondary | ICD-10-CM | POA: Diagnosis not present

## 2014-05-28 DIAGNOSIS — E861 Hypovolemia: Secondary | ICD-10-CM | POA: Diagnosis present

## 2014-05-28 DIAGNOSIS — E780 Pure hypercholesterolemia: Secondary | ICD-10-CM | POA: Diagnosis present

## 2014-05-28 DIAGNOSIS — R109 Unspecified abdominal pain: Secondary | ICD-10-CM | POA: Diagnosis present

## 2014-05-28 DIAGNOSIS — G4700 Insomnia, unspecified: Secondary | ICD-10-CM | POA: Diagnosis present

## 2014-05-28 DIAGNOSIS — J329 Chronic sinusitis, unspecified: Secondary | ICD-10-CM | POA: Diagnosis present

## 2014-05-28 DIAGNOSIS — R112 Nausea with vomiting, unspecified: Secondary | ICD-10-CM | POA: Diagnosis not present

## 2014-05-28 DIAGNOSIS — Z95810 Presence of automatic (implantable) cardiac defibrillator: Secondary | ICD-10-CM | POA: Diagnosis not present

## 2014-05-28 DIAGNOSIS — J381 Polyp of vocal cord and larynx: Secondary | ICD-10-CM | POA: Diagnosis present

## 2014-05-28 DIAGNOSIS — R1110 Vomiting, unspecified: Secondary | ICD-10-CM

## 2014-05-28 DIAGNOSIS — N183 Chronic kidney disease, stage 3 (moderate): Secondary | ICD-10-CM | POA: Diagnosis present

## 2014-05-28 DIAGNOSIS — K219 Gastro-esophageal reflux disease without esophagitis: Secondary | ICD-10-CM | POA: Diagnosis present

## 2014-05-28 DIAGNOSIS — G4730 Sleep apnea, unspecified: Secondary | ICD-10-CM | POA: Diagnosis present

## 2014-05-28 DIAGNOSIS — G43009 Migraine without aura, not intractable, without status migrainosus: Secondary | ICD-10-CM | POA: Diagnosis present

## 2014-05-28 DIAGNOSIS — R1084 Generalized abdominal pain: Secondary | ICD-10-CM | POA: Diagnosis present

## 2014-05-28 DIAGNOSIS — A419 Sepsis, unspecified organism: Secondary | ICD-10-CM | POA: Insufficient documentation

## 2014-05-28 DIAGNOSIS — I340 Nonrheumatic mitral (valve) insufficiency: Secondary | ICD-10-CM | POA: Diagnosis present

## 2014-05-28 DIAGNOSIS — F329 Major depressive disorder, single episode, unspecified: Secondary | ICD-10-CM | POA: Diagnosis present

## 2014-05-28 DIAGNOSIS — K5900 Constipation, unspecified: Secondary | ICD-10-CM | POA: Diagnosis present

## 2014-05-28 DIAGNOSIS — R197 Diarrhea, unspecified: Secondary | ICD-10-CM | POA: Diagnosis not present

## 2014-05-28 DIAGNOSIS — A084 Viral intestinal infection, unspecified: Secondary | ICD-10-CM | POA: Diagnosis present

## 2014-05-28 DIAGNOSIS — I5022 Chronic systolic (congestive) heart failure: Secondary | ICD-10-CM | POA: Diagnosis not present

## 2014-05-28 DIAGNOSIS — N390 Urinary tract infection, site not specified: Secondary | ICD-10-CM | POA: Diagnosis present

## 2014-05-28 DIAGNOSIS — I429 Cardiomyopathy, unspecified: Secondary | ICD-10-CM | POA: Diagnosis not present

## 2014-05-28 DIAGNOSIS — M1990 Unspecified osteoarthritis, unspecified site: Secondary | ICD-10-CM | POA: Diagnosis present

## 2014-05-28 DIAGNOSIS — Z885 Allergy status to narcotic agent status: Secondary | ICD-10-CM | POA: Diagnosis not present

## 2014-05-28 DIAGNOSIS — Z7982 Long term (current) use of aspirin: Secondary | ICD-10-CM | POA: Diagnosis not present

## 2014-05-28 DIAGNOSIS — D649 Anemia, unspecified: Secondary | ICD-10-CM | POA: Diagnosis present

## 2014-05-28 DIAGNOSIS — I428 Other cardiomyopathies: Secondary | ICD-10-CM | POA: Diagnosis present

## 2014-05-28 DIAGNOSIS — M810 Age-related osteoporosis without current pathological fracture: Secondary | ICD-10-CM | POA: Diagnosis present

## 2014-05-28 DIAGNOSIS — N3000 Acute cystitis without hematuria: Secondary | ICD-10-CM | POA: Diagnosis not present

## 2014-05-28 DIAGNOSIS — F419 Anxiety disorder, unspecified: Secondary | ICD-10-CM | POA: Diagnosis present

## 2014-05-28 LAB — CBC
HCT: 30.4 % — ABNORMAL LOW (ref 36.0–46.0)
Hemoglobin: 9.8 g/dL — ABNORMAL LOW (ref 12.0–15.0)
MCH: 28.7 pg (ref 26.0–34.0)
MCHC: 32.2 g/dL (ref 30.0–36.0)
MCV: 89.1 fL (ref 78.0–100.0)
Platelets: 183 10*3/uL (ref 150–400)
RBC: 3.41 MIL/uL — ABNORMAL LOW (ref 3.87–5.11)
RDW: 14 % (ref 11.5–15.5)
WBC: 6.8 10*3/uL (ref 4.0–10.5)

## 2014-05-28 LAB — CLOSTRIDIUM DIFFICILE BY PCR: Toxigenic C. Difficile by PCR: NEGATIVE

## 2014-05-28 LAB — BASIC METABOLIC PANEL
Anion gap: 15 (ref 5–15)
BUN: 15 mg/dL (ref 6–23)
CO2: 22 mmol/L (ref 19–32)
Calcium: 8.9 mg/dL (ref 8.4–10.5)
Chloride: 107 mEq/L (ref 96–112)
Creatinine, Ser: 0.9 mg/dL (ref 0.50–1.10)
GFR calc Af Amer: 74 mL/min — ABNORMAL LOW (ref >90)
GFR calc non Af Amer: 64 mL/min — ABNORMAL LOW (ref >90)
Glucose, Bld: 99 mg/dL (ref 70–99)
Potassium: 3.9 mmol/L (ref 3.5–5.1)
Sodium: 144 mmol/L (ref 135–145)

## 2014-05-28 LAB — GLUCOSE, CAPILLARY
Glucose-Capillary: 93 mg/dL (ref 70–99)
Glucose-Capillary: 89 mg/dL (ref 70–99)

## 2014-05-28 LAB — TROPONIN I
Troponin I: <0.03 ng/mL (ref <0.031)
Troponin I: <0.03 ng/mL (ref <0.031)
Troponin I: <0.03 ng/mL (ref <0.031)

## 2014-05-28 LAB — BRAIN NATRIURETIC PEPTIDE: B Natriuretic Peptide: 52.3 pg/mL (ref 0.0–100.0)

## 2014-05-28 LAB — CK: Total CK: 83 U/L (ref 7–177)

## 2014-05-28 MED ORDER — PANTOPRAZOLE SODIUM 40 MG PO TBEC: 40.0000 mg | DELAYED_RELEASE_TABLET | Freq: Every day | ORAL | Status: DC

## 2014-05-28 MED ORDER — OMEPRAZOLE 20 MG PO CPDR
40.0000 mg | DELAYED_RELEASE_CAPSULE | Freq: Two times a day (BID) | ORAL | Status: DC
  Filled 2014-05-28: qty 2

## 2014-05-28 MED ORDER — ONDANSETRON HCL 4 MG/2ML IJ SOLN: 4.0000 mg | Freq: Three times a day (TID) | INTRAMUSCULAR | Status: DC | PRN

## 2014-05-28 MED ORDER — VITAMIN E 45 MG (100 UNIT) PO CAPS
100.0000 [IU] | ORAL_CAPSULE | Freq: Every day | ORAL | Status: DC
  Filled 2014-05-28 (×2): qty 1

## 2014-05-28 MED ORDER — POLYETHYLENE GLYCOL 3350 17 G PO PACK
17.0000 g | PACK | Freq: Every day | ORAL | Status: DC
  Filled 2014-05-28 (×2): qty 1

## 2014-05-28 MED ORDER — ONDANSETRON 4 MG PO TBDP: 4.0000 mg | ORAL_TABLET | Freq: Three times a day (TID) | ORAL | Status: DC | PRN

## 2014-05-28 MED ORDER — ATORVASTATIN CALCIUM 40 MG PO TABS
40.0000 mg | ORAL_TABLET | ORAL | Status: DC
  Filled 2014-05-28 (×2): qty 1

## 2014-05-28 MED ORDER — OMEPRAZOLE 20 MG PO CPDR
40.0000 mg | DELAYED_RELEASE_CAPSULE | Freq: Two times a day (BID) | ORAL | Status: DC
  Administered 2014-05-28 – 2014-05-29 (×2): 40 mg via ORAL
  Filled 2014-05-28: qty 2

## 2014-05-28 MED ORDER — CARVEDILOL 12.5 MG PO TABS
12.5000 mg | ORAL_TABLET | Freq: Two times a day (BID) | ORAL | Status: DC
  Filled 2014-05-28 (×3): qty 1

## 2014-05-28 MED ORDER — VITAMIN C 500 MG PO TABS
1000.0000 mg | ORAL_TABLET | Freq: Every day | ORAL | Status: DC
  Filled 2014-05-28 (×2): qty 2

## 2014-05-28 MED ORDER — CEPHALEXIN 500 MG PO CAPS
500.0000 mg | ORAL_CAPSULE | Freq: Three times a day (TID) | ORAL | Status: DC
  Administered 2014-05-28 – 2014-05-29 (×4): 500 mg via ORAL
  Filled 2014-05-28 (×7): qty 1

## 2014-05-28 MED ORDER — DEXTROMETHORPHAN POLISTIREX 30 MG/5ML PO LQCR
30.0000 mg | Freq: Two times a day (BID) | ORAL | Status: DC | PRN
  Filled 2014-05-28: qty 5

## 2014-05-28 MED ORDER — ALPRAZOLAM 0.25 MG PO TABS
0.2500 mg | ORAL_TABLET | Freq: Two times a day (BID) | ORAL | Status: DC
  Filled 2014-05-28: qty 1

## 2014-05-28 MED ORDER — ASPIRIN EC 81 MG PO TBEC
81.0000 mg | DELAYED_RELEASE_TABLET | Freq: Every day | ORAL | Status: DC
  Administered 2014-05-28: 81 mg via ORAL
  Filled 2014-05-28 (×3): qty 1

## 2014-05-28 MED ORDER — ALENDRONATE SODIUM 70 MG PO TABS: 70.0000 mg | ORAL_TABLET | ORAL | Status: DC

## 2014-05-28 MED ORDER — HEPARIN SODIUM (PORCINE) 5000 UNIT/ML IJ SOLN
5000.0000 [IU] | Freq: Three times a day (TID) | INTRAMUSCULAR | Status: DC
  Filled 2014-05-28 (×6): qty 1

## 2014-05-28 MED ORDER — ALPRAZOLAM 0.25 MG PO TABS: 0.2500 mg | ORAL_TABLET | Freq: Two times a day (BID) | ORAL | Status: DC

## 2014-05-28 MED ORDER — SPIRONOLACTONE 25 MG PO TABS
25.0000 mg | ORAL_TABLET | Freq: Every day | ORAL | Status: DC
  Filled 2014-05-28: qty 1

## 2014-05-28 MED ORDER — FLUTICASONE PROPIONATE 50 MCG/ACT NA SUSP
2.0000 | Freq: Two times a day (BID) | NASAL | Status: DC
  Filled 2014-05-28: qty 16

## 2014-05-28 MED ORDER — DOCUSATE SODIUM 100 MG PO CAPS
100.0000 mg | ORAL_CAPSULE | Freq: Two times a day (BID) | ORAL | Status: DC
  Filled 2014-05-28 (×2): qty 1

## 2014-05-28 MED ORDER — SPIRONOLACTONE 25 MG PO TABS
25.0000 mg | ORAL_TABLET | Freq: Every day | ORAL | Status: DC
  Administered 2014-05-28: 25 mg via ORAL
  Filled 2014-05-28 (×2): qty 1

## 2014-05-28 MED ORDER — CALCIUM CARBONATE-VITAMIN D 500-200 MG-UNIT PO TABS
1.0000 | ORAL_TABLET | Freq: Every day | ORAL | Status: DC
  Filled 2014-05-28 (×2): qty 1

## 2014-05-28 MED ORDER — CIPROFLOXACIN HCL 500 MG PO TABS
500.0000 mg | ORAL_TABLET | Freq: Two times a day (BID) | ORAL | Status: DC
  Filled 2014-05-28 (×3): qty 1

## 2014-05-28 MED ORDER — CARVEDILOL 12.5 MG PO TABS
12.5000 mg | ORAL_TABLET | Freq: Two times a day (BID) | ORAL | Status: DC
  Administered 2014-05-29: 12.5 mg via ORAL
  Filled 2014-05-28 (×3): qty 1

## 2014-05-28 MED ORDER — SODIUM CHLORIDE 0.9 % IV SOLN
INTRAVENOUS | Status: DC
  Administered 2014-05-28: 03:00:00 via INTRAVENOUS

## 2014-05-28 MED ORDER — SODIUM CHLORIDE 0.9 % IJ SOLN
3.0000 mL | Freq: Two times a day (BID) | INTRAMUSCULAR | Status: DC
  Administered 2014-05-28 (×2): 3 mL via INTRAVENOUS

## 2014-05-28 MED ORDER — BIOTIN 5 MG PO CAPS: 5.0000 mg | ORAL_CAPSULE | Freq: Every day | ORAL | Status: DC

## 2014-05-28 MED ORDER — ACETAMINOPHEN 325 MG PO TABS: 650.0000 mg | ORAL_TABLET | Freq: Four times a day (QID) | ORAL | Status: DC | PRN

## 2014-05-28 NOTE — Telephone Encounter (Signed)
PLEASE NOTE: All timestamps contained within this report are represented as [place] Standard Time. CONFIDENTIALTY NOTICE: This fax transmission is intended only for the addressee. It contains information that is legally privileged, confidential or otherwise protected from use or disclosure. If you are not the intended recipient, you are strictly prohibited from reviewing, disclosing, copying using or disseminating any of this information or taking any action in reliance on or regarding this information. If you have received this fax in error, please notify us immediately by telephone so that we can arrange for its return to [place]. Phone: [phone], Toll-Free: [phone], Fax: [phone] Page: 1 of 2 Call Id: [id number] Page Patient Name: Ashley [name] Gender: Female DOB: 01/31/1946 Age: 69 Y 10 M 54 D Return Phone Number: [phone] (Primary), [phone] (Secondary) Address: City/State/Zip: [place] [address] Client  [facility] - Client Client Site [facility] - Day Physician [name] Contact Type Call Call Type Triage / Clinical Relationship To Patient Self Appointment Disposition EMR Appointment Not Necessary Return Phone Number [phone] (Primary) Chief Complaint Vomiting Initial Comment Caller states been having sharp pain from where belly button use to be. leg cramps . vomiting 8x in a row, could I take a pill for vomiting PreDisposition InappropriateToAsk Nurse Assessment Nurse: Martyn Ehrich, RN, Felicia Date/Time (Eastern Time): 05/27/2014 3:56:29 PM Confirm and document reason for call. If symptomatic, describe symptoms. ---Pt is having sharp pain where naval used to be. Pt has been vomiting since 12:30 pm today. Severe vomiting - golden like paste - no odor. Started week ago and they last 1/2 a second. Past May she  had 4 abcesses and was in hospital 21 days and lost part of colon and it was resected. Diarrhea or constipation. Pt is on fiber and miralax. Saw Dr. Laretta Bolster GI 2 wks ago and said to hold the stool softener but put her on fiber to bulk up the stool and she said it may take her year to get back to [place] and she will see her in 3 months to make sure she is not developing a hernia. She noted a little blood in stool. This am She had to push hard and was a little blood on Toilet paper this am. Upper abdomen is super sore. Reconstruction in mid Sept to reverse colostomy. No fever Has the patient traveled out of the country within the last 30 days? ---No Does the patient require triage? ---Yes Related visit to physician within the last 2 weeks? ---Yes Does the PT have any chronic conditions? (i.e. diabetes, asthma, etc.) ---Yes List chronic conditions. ---see above Guidelines Guideline Title Affirmed Question Affirmed Notes Nurse Date/Time [name] Time) Chest Pain [1] Chest pain lasts > 5 minutes AND [2] history of heart disease (i.e., heart attack, bypass surgery, Gaddy, RN, Felicia 1/88/4166 0:63:01 PM PLEASE NOTE: All timestamps contained within this report are represented as [place] Standard Time. CONFIDENTIALTY NOTICE: This fax transmission is intended only for the addressee. It contains information that is legally privileged, confidential or otherwise protected from use or disclosure. If you are not the intended recipient, you are strictly prohibited from reviewing, disclosing, copying using or disseminating any of this information or taking any action in reliance on or regarding this information. If you have received this fax in error, please notify us immediately by telephone so that we can arrange for its return to [place]. Phone: [phone], Toll-Free: [phone], Fax: [phone]  Page: 2 of 2 Call Id: [id number] Guidelines Guideline Title Affirmed Question Affirmed Notes  Nurse Date/Time [name] Time) angina, angioplasty, CHF; not just a heart murmur) Disp. Time [name] Time) Disposition Final User 05/27/2014 4:10:01 PM Send To RN Personal Martyn Ehrich, RN, [facility] 7/51/0258 5:27:78 PM 911 Follow Up Call Attempted Martyn Ehrich RN, Solmon Ice Reason: They are calling now 05/27/2014 4:17:36 PM Call Completed Berta Minor 2/42/3536 1:44:31 PM Call EMS 911 Now Yes Gaddy, RN, Alphia Kava Understands: Yes Disagree/Comply: Comply Care Advice Given Per Guideline CALL EMS 911 NOW: Immediate medical attention is needed. You need to hang up and call 911 (or an ambulance). Psychologist, forensic Discretion: I'll call you back in a few minutes to be sure you were able to reach them.) After Care Instructions Given Call Event Type User Date / Time Description Comments User: Daphene Calamity, RN Date/Time [name] Time): 05/27/2014 4:02:29 PM pain above naval and below ribs is moderate User: Daphene Calamity, RN Date/Time [name] Time): 05/27/2014 4:08:10 PM caller has a pacemaker User: Daphene Calamity, RN Date/Time (Eastern Time): 05/27/2014 4:08:49 PM Lots of vomiting and tightness in chest

## 2014-05-28 NOTE — Progress Notes (Signed)
PHARMACIST - PHYSICIAN COMMUNICATION  CONCERNING: P&T Medication Policy Regarding Oral Bisphosphonates  RECOMMENDATION: Your order for alendronate (Fosamax) has been discontinued at this time.  If the patient's post-hospital medical condition warrants safe use of this class of drugs, please resume the pre-hospital regimen upon discharge.  DESCRIPTION:  Alendronate (Fosamax) can cause severe esophageal erosions in patients who are unable to remain upright at least 30 minutes after taking this medication.   Since brief interruptions in therapy are thought to have minimal impact on bone mineral density, the Pharmacy & Therapeutics Committee has established that bisphosphonate orders should be routinely discontinued during hospitalization.   To override this safety policy and permit administration of Fosamax in the hospital, prescribers must write "DO NOT HOLD" in the comments section when placing the order for this class of medications.    

## 2014-05-28 NOTE — Telephone Encounter (Signed)
Pt is at [facility].

## 2014-05-28 NOTE — ED Notes (Signed)
BP 85/38 while attempting to discharge.  Provider made aware.  Will recheck BP in 10-15 minutes.

## 2014-05-28 NOTE — H&P (Signed)
[facility] History and Physical  Ashley [name] [id number]:[id number] DOB: March 15, 1946 DOA: 05/27/2014  Referring physician: ED physician PCP: [name], MD  Specialists:   Chief Complaint: Nausea, vomiting, diarrhea, abdominal pain  HPI: Ashley [name] is a 69 y.o. female past medical history of congestive heart failure, AICD placement, chronic kidney disease-stage III, fibromyalgia, who presents with nausea, vomiting, diarrhea and abdominal pain.  Patient reports that her symptoms started at about 12:30 PM. She vomited 22 times without blood in the vomitus. She does not have fever, but has chills. She does not have symptoms for UTI. She reports that she used azithromycin 2 weeks ago for sore throat. No sick contact. She reports that she is taking Lipitor, she has bilateral leg muscle cramps recently. She has mild chest tightness, no SOB. Patient denies headaches, cough, dysuria, urgency, frequency, hematuria, skin rashes.   Work up in the ED demonstrates hypotension, negative lipase, leukocytosis with WBC 15.0. CT abdomen showed bladder wall thickening.  Review of Systems: As presented in the history of presenting illness, rest negative.  Where does patient live?  At home Can patient participate in ADLs? A little  Allergy:  Allergies  Allergen Reactions  . Antihistamines, Chlorpheniramine-Type Other (See Comments)    Makes her eyes look like she sees strobe lights  . Codeine Other (See Comments)    Confusion and dizziness  . Cyclobenzaprine Other (See Comments)    Adverse reaction - back and throat pain, dizziness, exhaustion  . Cymbalta [Duloxetine Hcl] Other (See Comments)    "body spasms and made me feel weird in my head"   . Influenza Vac Split [Flu Virus Vaccine] Other (See Comments)    "allergic to concentrated eggs that are in vaccine"  . Penicillins Other (See Comments)    REACTION: "told 50 years ago I couldn't take it; might be immune to it", able to take  amoxicillin  . Sulfasalazine Other (See Comments)    "makes my whole body smell like sulfa & makes me sick just smelling it"  . Atorvastatin Other (See Comments)    Severe muscle cramps to generic atorvastatin.  Able to take brand lipitor.  . Chlorhexidine Other (See Comments)    blisters  . Dilaudid [Hydromorphone Hcl] Other (See Comments)    Feels like she is burning internally   . Latex Nausea Only, Rash and Other (See Comments)    Pain and infection  . Morphine And Related Other (See Comments)    Internally feels like she is on fire   . Pravastatin Other (See Comments)    myalgias  . Prevacid [Lansoprazole] Other (See Comments)    Worsened GI side effects  . Red Dye Nausea Only and Swelling  . Rosuvastatin Other (See Comments)    Was not effective controlling lipids  . Simvastatin Other (See Comments)    Leg cramps  . Metaxalone Other (See Comments)    REACTION: unknown  . Tape Other (See Comments)    All adhesives     Past Medical History  Diagnosis Date  . Migraine without aura, without mention of intractable migraine without mention of status migrainosus   . History of diverticulitis of colon   . Pure hypercholesterolemia   . Irritable bowel syndrome   . Laryngeal nodule     Dr. [place]ena Edis  . Fibrocystic breast disease   . Hemorrhoids   . Anxiety   . GERD (gastroesophageal reflux disease)   . Systolic CHF     EF 73-41%, LV dilation,  significant MR, goal weight 150lbs  . Nonischemic cardiomyopathy   . History of pneumonia 2012  . Mitral regurgitation   . Anxiety   . Bronchiectasis     possibly mild, treat URIs aggressively  . Hypertension   . Heart murmur   . Angina   . Lung disease 07/14/11  . Shortness of breath     "resting"  . Anemia   . Fibromyalgia   . Depression   . Disorder of vocal cords   . Chronic sinusitis   . Insomnia   . Osteoporosis 01/2013  . sigmoid diverticulitis with perforation, abscess and fistula 09/30/2013  . H/O multiple allergies  10/10/2013  . PONV (postoperative nausea and vomiting)   . CHF (congestive heart failure) 2011  . Pneumonia 2011  . Pacemaker   . Automatic implantable cardioverter-defibrillator in situ 2012  . Osteoarthritis     knees, back, hands, low back,   . Cancer     basal cell, arms & neck  . Sleep apnea 2010    borderline, inconclusive- /w [place]   . Hemorrhoids     Past Surgical History  Procedure Laterality Date  . Appendectomy    . Mandible surgery      "got about 6 pins in the bottom of my jaw"  . Knee arthroscopy w/ orif      Left; "meniscus tear"  . Osteotomy      Left foot  . 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 Dexa  03/28/99    osteopenia L/S  . Colonoscopy  1998    nml (sigmoid-Gilbert-nol)  . Head mri, head ct  10/1998    ? H/A focus ([facility])  . Emg/mcv  10/16/01    + mild carpal tunnel  . Dexa  03/12/03    2.2 Spine 0.1 Hip  Osteopenia  . Mri right hip  12/12/01    Tendonitis Gluteus Medius to Gtr Trochanter Neg Bursitis (Dr. Eulas Post)  . L/s films  11/21/01    nml; Right hip nml  . Mri u/s  11/21/01    min. DDD L/3-4  . Colonoscopy  01/04/04    polyps, diverticulosis/severe  . Dexa  11/14/05    1.9 Spine 0.3 Hip  slight improvement  . Flex laryngoscopy  06/11/06    ([facility]) nml  . Dexa  11/22/06    improved osteo and fem neck  . [place] bunionectomy  11/04/08    Right Great Toe (Dr. Beola Cord)  . Breast mass excision  01/2010    Left-fibrocystic change w/intraductal papilloma, no malignancy  . Ct chest  12/2010    possible bronchiectasis lower lobes, 02/2011 - enlarged bilateral effusions, bibasilar atx  . Stress myoview  02/2011    no significant ischemia  . Ct chest  04/2011    no PE.  abnormal R hilar and mediastinal adenopathy --> on rpt 05/2011, resolved  . US echocardiography  04/2011    mildly dilated LV, EF 25-30%, hypokinesis, restrictive physiology, mod MR, no AS  . Icd placement  07/14/11    w/pacemaker  . Tonsillectomy and adenoidectomy    .  Breast biopsy      "I've had 2-3 bx; all benign"  . Augmentation mammaplasty    . 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 Abdominal hysterectomy  1987    w/BSO  . Lasik      left eye  .  Knee surgery      left  . Mandible surgery    . Sinus surgery      x3 with balloon   . Dexa  01/2013    T score -2.6 at spine  . Partial colectomy N/A 10/02/2013    Procedure: PARTIAL COLECTOMY;  Surgeon: Imogene Burn. Georgette Dover, MD;  Location: [facility];  Service: General;  Laterality: N/A;  . Colostomy N/A 10/02/2013    Procedure: DESCENDING COLOSTOMY;  Surgeon: Imogene Burn. Georgette Dover, MD;  Location: [facility];  Service: General;  Laterality: N/A;  . Colostomy reversal  02/10/2014    dr Georgette Dover  . Colostomy reversal N/A 02/10/2014    Procedure: COLOSTOMY REVERSAL;  Surgeon: [name], MD;  Location: [place];  Service: General;  Laterality: N/A;  . Implantable cardioverter defibrillator implant N/A 07/14/2011    Procedure: IMPLANTABLE CARDIOVERTER DEFIBRILLATOR IMPLANT;  Surgeon: Evans Lance, MD;  Location: [facility] CATH LAB;  Service: Cardiovascular;  Laterality: N/A;    Social History:  reports that she has never smoked. She has never used smokeless tobacco. She reports that she does not drink alcohol or use illicit drugs.  Family History:  Family History  Problem Relation Age of Onset  . Lung cancer Father     + smoker  . Dementia Mother   . Osteoporosis Mother     Lumbar spine  . Stroke Mother     x 5 @ 70 YOA  . Arthritis Mother     hands  . Emphysema Mother   . Alcohol abuse Paternal Grandfather   . Hypertension Maternal Grandfather     ?  . Stroke Paternal Grandfather     ?  Marland Kitchen Heart disease Paternal Grandmother   . Heart disease Paternal Grandfather   . Colon polyps Father   . Colon cancer Neg Hx   . Esophageal cancer Neg Hx   . Gallbladder disease Neg Hx      Prior to Admission medications   Medication Sig Start Date End Date Taking? Authorizing Provider   acetaminophen (TYLENOL) 325 MG tablet Take 325 mg by mouth 2 (two) times daily as needed for moderate pain.   Yes Historical Provider, MD  alendronate (FOSAMAX) 70 MG tablet Take 70 mg by mouth every Thursday. Take with a full glass of water on an empty stomach.   Yes Historical Provider, MD  ALPRAZolam Duanne Moron) 0.25 MG tablet TAKE 1 TABLET BY MOUTH TWICE A DAY 02/17/14  Yes [name], MD  Ascorbic Acid (VITAMIN C) 1000 MG tablet Take 1,000 mg by mouth daily.   Yes Historical Provider, MD  aspirin EC 81 MG tablet Take 81 mg by mouth at bedtime.  11/28/13  Yes [name], MD  atorvastatin (LIPITOR) 40 MG tablet Take 1 tablet (40 mg total) by mouth daily. Patient taking differently: Take 40 mg by mouth every other day.  04/15/14  Yes [name], MD  BIOTIN 5000 PO Take 5,000 mcg by mouth daily.   Yes Historical Provider, MD  Calcium Carbonate-Vitamin D (CALCIUM + D PO) Take 1 tablet by mouth daily.   Yes Historical Provider, MD  carvedilol (COREG) 12.5 MG tablet Take 1 tablet (12.5 mg total) by mouth 2 (two) times daily with a meal. 04/28/14  Yes Minna Merritts, MD  Coenzyme Q10 (CO Q 10 PO) Take 1 tablet by mouth daily.   Yes Historical Provider, MD  dextromethorphan (DELSYM) 30 MG/5ML liquid Take 30 mg by mouth as needed for cough.   Yes Historical  Provider, MD  Diphenhyd-Hydrocort-Nystatin (FIRST-DUKES MOUTHWASH MT) Use as directed 1 application in the mouth or throat as needed (for thrush).   Yes Historical Provider, MD  docusate sodium (COLACE) 100 MG capsule Take 1 capsule (100 mg total) by mouth 2 (two) times daily. 04/15/14  Yes [name], MD  omeprazole (PRILOSEC) 40 MG capsule Take 1 capsule (40 mg total) by mouth 2 (two) times daily before a meal. 01/29/14  Yes [name], MD  polyethylene glycol (MIRALAX / GLYCOLAX) packet Take 9 g by mouth daily.   Yes Historical Provider, MD  PRESCRIPTION MEDICATION Place 1 application into both ears daily as needed (MED  NAME: mometasone 0.1% SOLUTION (for eczema on ears)).   Yes Historical Provider, MD  simvastatin (ZOCOR) 40 MG tablet Take 40 mg by mouth daily.  03/21/14  Yes Historical Provider, MD  spironolactone (ALDACTONE) 25 MG tablet Take 25 mg by mouth every morning.   Yes Historical Provider, MD  triamcinolone (NASACORT) 55 MCG/ACT AERO nasal inhaler Place 2 sprays into the nose 2 (two) times daily.   Yes Historical Provider, MD  vitamin E 100 UNIT capsule Take 100 Units by mouth daily.   Yes Historical Provider, MD  Wheat Dextrin (BENEFIBER DRINK MIX PO) Take 1 application by mouth daily.   Yes Historical Provider, MD  azithromycin (ZITHROMAX Z-PAK) 250 MG tablet Take as directed 05/19/14   Lafayette Dragon, MD  ondansetron (ZOFRAN ODT) 4 MG disintegrating tablet Take 1 tablet (4 mg total) by mouth every 8 (eight) hours as needed for nausea or vomiting. 05/28/14   Gareth Morgan, MD  torsemide (DEMADEX) 10 MG tablet Take 10 mg by mouth daily as needed (for water retention).     Historical Provider, MD    Physical Exam: Filed Vitals:   05/28/14 0000 05/28/14 0015 05/28/14 0052 05/28/14 0100  BP: 98/46 96/78 86/60  97/34  Pulse: 98 96  96  Temp:      TempSrc:      Resp: 21 27  22   Height:      Weight:      SpO2: 97% 97%  98%   General: Not in acute distress HEENT:       Eyes: PERRL, EOMI, no scleral icterus       ENT: No discharge from the ears and nose, no pharynx injection, no tonsillar enlargement.        Neck: No JVD, no bruit, no mass felt. Cardiac: S1/S2, RRR, No murmurs, No gallops or rubs Pulm: Good air movement bilaterally. Clear to auscultation bilaterally. No rales, wheezing, rhonchi or rubs. Abd: Soft, nondistended, mildly tender over the LLQ and mid of abdomen, no rebound pain, no organomegaly, BS present Ext: Trace leg edema bilaterally. 2+DP/PT pulse bilaterally Musculoskeletal: No joint deformities, erythema, or stiffness, ROM full Skin: No rashes.  Neuro: Alert and oriented X3,  cranial nerves II-XII grossly intact, muscle strength 5/5 in all extremeties, sensation to light touch intact.  Psych: Patient is not psychotic, no suicidal or hemocidal ideation.  Labs on Admission:  Basic Metabolic Panel:  Recent Labs Lab 05/27/14 0900 05/27/14 1736  NA 141 143  K 4.5 4.3  CL 107 104  CO2 27 21  GLUCOSE 100* 103*  BUN 26* 22  CREATININE 0.93 0.98  CALCIUM 9.9 10.1   Liver Function Tests:  Recent Labs Lab 05/27/14 0900 05/27/14 1736  AST 17 28  ALT 16 21  ALKPHOS 110 109  BILITOT 0.2 0.5  PROT 8.2 8.1  ALBUMIN 4.4 4.4  Recent Labs Lab 05/27/14 1736  LIPASE 40   No results for input(s): AMMONIA in the last 168 hours. CBC:  Recent Labs Lab 05/27/14 0900 05/27/14 1736  WBC 10.1 15.0*  NEUTROABS 7.4 13.8*  HGB 11.5* 12.1  HCT 35.5* 36.5  MCV 86.9 86.5  PLT 295.0 178   Cardiac Enzymes:  Recent Labs Lab 05/27/14 0900  CKTOTAL 103    BNP (last 3 results) No results for input(s): PROBNP in the last 8760 hours. CBG: No results for input(s): GLUCAP in the last 168 hours.  Radiological Exams on Admission: Ct Abdomen Pelvis W Contrast  05/27/2014   CLINICAL DATA:  Abdominal pain for 1 week, vomiting beginning today. Mid abdominal tenderness to palpation. History of surgery/bowel resection, diverticulitis.  EXAM: CT ABDOMEN AND PELVIS WITH CONTRAST  TECHNIQUE: Multidetector CT imaging of the abdomen and pelvis was performed using the standard protocol following bolus administration of intravenous contrast.  CONTRAST:  100 cc Omnipaque 300  COMPARISON:  CT of the abdomen and pelvis Sep 30, 2013  FINDINGS: LUNG BASES: Included view of the lung bases are clear. Partially imaged cardiac defibrillator leads. Visualized heart and pericardium are unremarkable.  SOLID ORGANS: The liver, spleen, gallbladder, and adrenal glands are unremarkable. Parallel apparent biliary structures in the pancreatic head suggest pancreatic divisum.  GASTROINTESTINAL  TRACT: Small hiatal hernia. Rectosigmoid surgical anastomosis. Mild colonic diverticulosis. The stomach, small and large bowel are overall normal in course and caliber without inflammatory changes.  KIDNEYS/ URINARY TRACT: Kidneys are orthotopic, demonstrating symmetric enhancement. No nephrolithiasis, hydronephrosis or solid renal masses. Too small to characterize hypodensities in the kidneys bilaterally. Stable exophytic 11 mm cyst arising from the lower pole of RIGHT kidney. The unopacified ureters are normal in course and caliber. Delayed imaging through the kidneys demonstrates symmetric prompt contrast excretion within the proximal urinary collecting system. Urinary bladder is partially distended, circumferential wall thickening.  PERITONEUM/RETROPERITONEUM: No intraperitoneal free fluid nor free air. Aortoiliac vessels are normal in course and caliber, mild calcific atherosclerosis. No lymphadenopathy by CT size criteria. Status post hysterectomy.  SOFT TISSUE/OSSEOUS STRUCTURES: Nonsuspicious. Rectus abdominis diastases/very wide necked hernia with scarring.  IMPRESSION: Mild urinary bladder wall thickening likely reflects under distention though, cystitis could have a similar appearance.  Interval partial colectomy with rectosigmoid bowel anastomosis, no CT findings of acute diverticulitis or bowel obstruction.   Electronically Signed   By: Elon Alas   On: 05/27/2014 21:57    EKG: Independently reviewed.   Assessment/Plan Principal Problem:   Abdominal pain, vomiting, and diarrhea Active Problems:   Osteoarthritis   Polyp of vocal cord or larynx   Chronic systolic CHF (congestive heart failure)   Cardiomyopathy, dilated, nonischemic   CKD (chronic kidney disease) stage 3, GFR 30-59 ml/min   ICD (implantable cardioverter-defibrillator) in place   Nausea and vomiting   Sepsis  Nausea, vomiting, diarrhea and abdominal pain: it is most likely due to viral gastroenteritis. CT-abd no  obstruction. Given recent history of antibiotics use, it is important to rule out C. Difficile. Currently patient is septic, with WBC 15.0 and hypotension. Blood pressure improved after received 2 L of normal saline bolus.  -will admit to tele bed -IV fluid: 75 mL per hour normal saline -Will give bolus normal saline if bp drops -hold pressure medications and diuretics. Dalene Seltzer for nausea - check C diff pcr, GI pathogen panel -Follow-up blood culture  Congestive heart failure: 2-D echo on 04/14/14 showed EF50-55% with grade 1 diastolic dysfunction. Patient is taking torsemide  and spironolactone at home. Patient is euvolemic on admission. The patient presents with sepsis, she needs IV fluids. - Hold diuretics - IV fluid as above - check BNP  - continue ASA - Trop x 3 given chest tightness  CKD-III: Cre is normal on admission -follow up renal Fx by BMP  HLD: Patient is taking Lipitor. She reports having leg muscle cramps recently. -will hold lipitor -check CK  Possible UTI?: patient does not have symptoms for UTI, but her urinalysis has small amount of leukocyte. CT abdomen showed bladder wall thickening. Given her septic picture, will start antibiotics -Ciprofloxacin (patient is allergic to penicillin, Rocephin is not good choice). -folllow up Urine culture.   DVT ppx: SQ Heparin    Code Status: Full code Family Communication: Yes, patient's   husband    at bed side Disposition Plan: Admit to inpatient   Date of Service 05/28/2014    Ivor Costa [facility] Pager [phone]  If 7PM-7AM, please contact night-coverage [email or web address] Password [email or web address] 05/28/2014, 1:30 AM

## 2014-05-28 NOTE — Progress Notes (Signed)
Called ED RN for report, Room ready for admit.

## 2014-05-28 NOTE — Evaluation (Signed)
Physical Therapy Evaluation and Discharge Patient Details Name: Ashley [name] MRN: [id number] DOB: 03-22-1946 Today's Date: 05/28/2014   History of Present Illness  is a 69 y.o. female past medical history of congestive heart failure, AICD placement, chronic kidney disease-stage III, fibromyalgia, who presents with nausea, vomiting, diarrhea and abdominal pain.  Clinical Impression  Patient evaluated by Physical Therapy with no further acute PT needs identified. All education has been completed and the patient has no further questions. At the time of PT eval pt was independent with all mobility, demonstrated 4+ to 5/5 strength in upper and lower extremities, and ambulated 960'. See below for any follow-up Physial Therapy or equipment needs. PT is signing off. Thank you for this referral.     Follow Up Recommendations No PT follow up    Equipment Recommendations  None recommended by PT    Recommendations for Other Services       Precautions / Restrictions Precautions Precautions: Fall Restrictions Weight Bearing Restrictions: No      Mobility  Bed Mobility               General bed mobility comments: Pt sitting up in recliner upon PT arrival.   Transfers Overall transfer level: Independent Equipment used: None                Ambulation/Gait Ambulation/Gait assistance: Independent Ambulation Distance (Feet): 960 Feet Assistive device: None Gait Pattern/deviations: WFL(Within Functional Limits)   Gait velocity interpretation: at or above normal speed for age/gender General Gait Details: No LOB noted. Occasional drift right or left however pt states this is her baseline.   Stairs            Wheelchair Mobility    Modified Rankin (Stroke Patients Only)       Balance Overall balance assessment: No apparent balance deficits (not formally assessed)                                           Pertinent Vitals/Pain Pain  Assessment: No/denies pain    Home Living Family/patient expects to be discharged to:: Private residence Living Arrangements: Spouse/significant other Available Help at Discharge: Family;Available 24 hours/day Type of Home: House Home Access: Stairs to enter Entrance Stairs-Rails: Can reach both;Left;Right Entrance Stairs-Number of Steps: 5 Home Layout: Able to live on main level with bedroom/bathroom Home Equipment: Walker - 2 wheels Additional Comments: pt has been staying on main floor since admission to [facility] hospital and was ambulating with RW     Prior Function Level of Independence: Independent         Comments: pt was independent PTA on May 1st to [facility] hospital; since D/C was ambulating with RW      Hand Dominance   Dominant Hand: Right    Extremity/Trunk Assessment   Upper Extremity Assessment: Defer to OT evaluation;Overall WFL for tasks assessed           Lower Extremity Assessment: Overall WFL for tasks assessed      Cervical / Trunk Assessment: Normal  Communication   Communication: No difficulties  Cognition Arousal/Alertness: Awake/alert Behavior During Therapy: WFL for tasks assessed/performed Overall Cognitive Status: Within Functional Limits for tasks assessed                      General Comments      Exercises  Assessment/Plan    PT Assessment Patent does not need any further PT services  PT Diagnosis Abnormality of gait   PT Problem List    PT Treatment Interventions     PT Goals (Current goals can be found in the Care Plan section) Acute Rehab PT Goals PT Goal Formulation: All assessment and education complete, DC therapy    Frequency     Barriers to discharge        Co-evaluation               End of Session Equipment Utilized During Treatment: Gait belt Activity Tolerance: Patient tolerated treatment well Patient left: in bed;with call bell/phone within reach;with family/visitor present;with  nursing/sitter in room (Sitting EOB) Nurse Communication: Mobility status         Time: 1314-3888 PT Time Calculation (min) (ACUTE ONLY): 21 min   Charges:   PT Evaluation $Initial PT Evaluation Tier I: 1 Procedure PT Treatments $Gait Training: 8-22 mins   PT G Codes:        Rolinda Roan 06/25/2014, 3:17 PM   Rolinda Roan, PT, DPT Acute Rehabilitation Services Pager: [phone]

## 2014-05-28 NOTE — Progress Notes (Signed)
[facility] PROGRESS NOTE  Ashley [name] WGN:[id number] DOB: 04/25/1946 DOA: 05/27/2014 PCP: [name], MD  Assessment/Plan: Acute viral gastroenteritis Nausea and vomiting resolved. Still has watery diarrhea. Recently treated with azithromycin 2 weeks back for sore throat. CT abdomen unremarkable for colitis. Stool for C. difficile negative. Follow GI pathogen panel. Follow blood cultures. Received IV normal saline bolus on admission. Will reduce rate to 50 mL/h and discontinued after none.  Chronic CHF Currently hypovolemic. Last echo one month back showing EF of 50-50% with grade 1 diastolic dysfunction. Continue aspirin, coreg and Aldactone. Uses torsemide irregularly. Continue statin   CKD stage III Renal function at baseline  ? UTI UA positive for UTI. CT abdomen showing bladder wall thickening. Refused ciprofloxacin and placed on Keflex. Follow urine culture.  DVT prophylaxis: Heparin     Code Status: full code Family Communication: none at bedside Disposition Plan: home possibly in am    Consultants:  none  Procedures:  none  Antibiotics:  keflex  HPI/Subjective: Pt seen and examined. Reports 6-7 episodes of watery diarrhea this morning. No further nausea or vomiting.  Objective: Filed Vitals:   05/28/14 0932  BP: 125/52  Pulse: 72  Temp: 99.7 F (37.6 C)  Resp: 18    Intake/Output Summary (Last 24 hours) at 05/28/14 1312 Last data filed at 05/28/14 0900  Gross per 24 hour  Intake  217.5 ml  Output      0 ml  Net  217.5 ml   Filed Weights   05/27/14 1729  Weight: 71.215 kg (157 lb)    Exam:   General:  Elderly female in no acute distress  HEENT: No pallor, moist oral mucosa  Chest: Clear to auscultation bilaterally  CVS: Normal S1 and S2, no murmurs  Abdomen: Soft, nondistended, nontender, all sounds present  Extremities: Warm, no edema Data Reviewed: Basic Metabolic Panel:  Recent Labs Lab 05/27/14 0900  05/27/14 1736 05/28/14 0816  NA 141 143 144  K 4.5 4.3 3.9  CL 107 104 107  CO2 27 21 22   GLUCOSE 100* 103* 99  BUN 26* 22 15  CREATININE 0.93 0.98 0.90  CALCIUM 9.9 10.1 8.9   Liver Function Tests:  Recent Labs Lab 05/27/14 0900 05/27/14 1736  AST 17 28  ALT 16 21  ALKPHOS 110 109  BILITOT 0.2 0.5  PROT 8.2 8.1  ALBUMIN 4.4 4.4    Recent Labs Lab 05/27/14 1736  LIPASE 40   No results for input(s): AMMONIA in the last 168 hours. CBC:  Recent Labs Lab 05/27/14 0900 05/27/14 1736 05/28/14 0816  WBC 10.1 15.0* 6.8  NEUTROABS 7.4 13.8*  --   HGB 11.5* 12.1 9.8*  HCT 35.5* 36.5 30.4*  MCV 86.9 86.5 89.1  PLT 295.0 178 183   Cardiac Enzymes:  Recent Labs Lab 05/27/14 0900 05/28/14 0317 05/28/14 0816  CKTOTAL 103 83  --   TROPONINI  --  <0.03 <0.03   BNP (last 3 results) No results for input(s): PROBNP in the last 8760 hours. CBG:  Recent Labs Lab 05/28/14 0228 05/28/14 0800  GLUCAP 93 89    Recent Results (from the past 240 hour(s))  Clostridium Difficile by PCR     Status: None   Collection Time: 05/28/14  1:38 AM  Result Value Ref Range Status   C difficile by pcr NEGATIVE NEGATIVE Final     Studies: Ct Abdomen Pelvis W Contrast  05/27/2014   CLINICAL DATA:  Abdominal pain for 1 week, vomiting beginning  today. Mid abdominal tenderness to palpation. History of surgery/bowel resection, diverticulitis.  EXAM: CT ABDOMEN AND PELVIS WITH CONTRAST  TECHNIQUE: Multidetector CT imaging of the abdomen and pelvis was performed using the standard protocol following bolus administration of intravenous contrast.  CONTRAST:  100 cc Omnipaque 300  COMPARISON:  CT of the abdomen and pelvis Sep 30, 2013  FINDINGS: LUNG BASES: Included view of the lung bases are clear. Partially imaged cardiac defibrillator leads. Visualized heart and pericardium are unremarkable.  SOLID ORGANS: The liver, spleen, gallbladder, and adrenal glands are unremarkable. Parallel  apparent biliary structures in the pancreatic head suggest pancreatic divisum.  GASTROINTESTINAL TRACT: Small hiatal hernia. Rectosigmoid surgical anastomosis. Mild colonic diverticulosis. The stomach, small and large bowel are overall normal in course and caliber without inflammatory changes.  KIDNEYS/ URINARY TRACT: Kidneys are orthotopic, demonstrating symmetric enhancement. No nephrolithiasis, hydronephrosis or solid renal masses. Too small to characterize hypodensities in the kidneys bilaterally. Stable exophytic 11 mm cyst arising from the lower pole of RIGHT kidney. The unopacified ureters are normal in course and caliber. Delayed imaging through the kidneys demonstrates symmetric prompt contrast excretion within the proximal urinary collecting system. Urinary bladder is partially distended, circumferential wall thickening.  PERITONEUM/RETROPERITONEUM: No intraperitoneal free fluid nor free air. Aortoiliac vessels are normal in course and caliber, mild calcific atherosclerosis. No lymphadenopathy by CT size criteria. Status post hysterectomy.  SOFT TISSUE/OSSEOUS STRUCTURES: Nonsuspicious. Rectus abdominis diastases/very wide necked hernia with scarring.  IMPRESSION: Mild urinary bladder wall thickening likely reflects under distention though, cystitis could have a similar appearance.  Interval partial colectomy with rectosigmoid bowel anastomosis, no CT findings of acute diverticulitis or bowel obstruction.   Electronically Signed   By: Awilda Metro   On: 05/27/2014 21:57    Scheduled Meds: . ALPRAZolam  0.25 mg Oral BID  . aspirin EC  81 mg Oral QHS  . [START ON 05/29/2014] atorvastatin  40 mg Oral QODAY  . calcium-vitamin D  1 tablet Oral Q breakfast  . carvedilol  12.5 mg Oral BID WC  . cephALEXin  500 mg Oral 3 times per day  . docusate sodium  100 mg Oral BID  . fluticasone  2 spray Each Nare BID  . heparin  5,000 Units Subcutaneous 3 times per day  . omeprazole  40 mg Oral BID AC  .  polyethylene glycol  17 g Oral Daily  . sodium chloride  3 mL Intravenous Q12H  . spironolactone  25 mg Oral Daily  . vitamin C  1,000 mg Oral Daily  . vitamin E  100 Units Oral Daily   Continuous Infusions: . sodium chloride 50 mL/hr at 05/28/14 1059      Time spent: 25 minutes    Roschelle Calandra  [facility] Pager [phone] If 7PM-7AM, please contact night-coverage at [email or web address], password [facility] 05/28/2014, 1:12 PM  LOS: 1 day

## 2014-05-28 NOTE — Progress Notes (Signed)
Refused to take Cipro. Make her sick.

## 2014-05-28 NOTE — Telephone Encounter (Signed)
Pt wanted to let us know that she was admitted into the hospital for Nausea vomiting and diarrhea.  She is just letting us know and that if any doctor on call could go and check on her.  Her bp when she got there was 95/67 and went down to 65/32.  Again she just wants to let us know.

## 2014-05-28 NOTE — Progress Notes (Signed)

## 2014-05-28 NOTE — ED Notes (Signed)
Dr. Billy Fischer at bedside speaking with pt.

## 2014-05-28 NOTE — Telephone Encounter (Signed)
Noted. Pt admitted with viral gastro and possible sepsis

## 2014-05-29 ENCOUNTER — Encounter (HOSPITAL_COMMUNITY): Payer: Self-pay | Admitting: Internal Medicine

## 2014-05-29 DIAGNOSIS — N390 Urinary tract infection, site not specified: Secondary | ICD-10-CM | POA: Diagnosis present

## 2014-05-29 DIAGNOSIS — N3000 Acute cystitis without hematuria: Secondary | ICD-10-CM

## 2014-05-29 DIAGNOSIS — I429 Cardiomyopathy, unspecified: Secondary | ICD-10-CM

## 2014-05-29 LAB — GLUCOSE, CAPILLARY
Glucose-Capillary: 96 mg/dL (ref 70–99)
Glucose-Capillary: 102 mg/dL — ABNORMAL HIGH (ref 70–99)

## 2014-05-29 MED ORDER — CEPHALEXIN 500 MG PO CAPS: 500.0000 mg | ORAL_CAPSULE | Freq: Three times a day (TID) | ORAL | Status: DC

## 2014-05-29 MED ORDER — NON FORMULARY: 12.5000 mg | Freq: Two times a day (BID) | Status: DC

## 2014-05-29 NOTE — Progress Notes (Signed)
Patient was discharged home with husband. Patient was given home medications back from pharmacy. Patient was given prescriptions and discharge education. Patient was discharged with belongings and stable.

## 2014-05-29 NOTE — Evaluation (Signed)
Occupational Therapy Evaluation Patient Details Name: Ashley [name] MRN: [id number] DOB: [birth year] Today's Date: 05/29/2014    History of Present Illness is a 68 y.o. female past medical history of congestive heart failure, AICD placement, chronic kidney disease-stage III, fibromyalgia, who presents with nausea, vomiting, diarrhea and abdominal pain.   Clinical Impression   Pt is functioning at her baseline in ADL and ADL transfers.  No further OT needs.   Follow Up Recommendations  No OT follow up    Equipment Recommendations  None recommended by OT    Recommendations for Other Services       Precautions / Restrictions Restrictions Weight Bearing Restrictions: No      Mobility Bed Mobility                  Transfers Overall transfer level: Independent Equipment used: None                  Balance                                            ADL Overall ADL's : Independent                                       General ADL Comments: brings foot to opposite knee without difficulty to donn socks     Vision                     Perception     Praxis      Pertinent Vitals/Pain Pain Assessment: No/denies pain     Hand Dominance Right   Extremity/Trunk Assessment Upper Extremity Assessment Upper Extremity Assessment: Overall WFL for tasks assessed   Lower Extremity Assessment Lower Extremity Assessment: Overall WFL for tasks assessed   Cervical / Trunk Assessment Cervical / Trunk Assessment: Normal   Communication Communication Communication: No difficulties   Cognition Arousal/Alertness: Awake/alert Behavior During Therapy: WFL for tasks assessed/performed Overall Cognitive Status: Within Functional Limits for tasks assessed                     General Comments       Exercises       Shoulder Instructions      Home Living Family/patient expects to be discharged to:: Private  residence Living Arrangements: Spouse/significant other Available Help at Discharge: Family;Available 24 hours/day Type of Home: House Home Access: Stairs to enter [employer] of Steps: 5 Entrance Stairs-Rails: Can reach both;Left;Right Home Layout: Able to live on main level with bedroom/bathroom;Two level     Bathroom Shower/Tub: Teacher, early years/pre: Standard     Home Equipment: Environmental consultant - 2 wheels   Additional Comments: pt climbs a flight of stairs as needed      Prior Functioning/Environment Level of Independence: Independent        Comments: pt is assisted for heavy housekeeping tasks, otherwise drives and is independent    OT Diagnosis:     OT Problem List:     OT Treatment/Interventions:      OT Goals(Current goals can be found in the care plan section)    OT Frequency:     Barriers to D/C:            Co-evaluation  End of Session    Activity Tolerance: Patient tolerated treatment well Patient left: in bed;with call bell/phone within reach   Time: 0900-0930 OT Time Calculation (min): 30 min Charges:  OT General Charges $OT Visit: 1 Procedure OT Evaluation $Initial OT Evaluation Tier I: 1 Procedure OT Treatments $Self Care/Home Management : 8-22 mins G-Codes:    Malka So 05/29/2014, 9:38 AM  [phone]

## 2014-05-29 NOTE — Telephone Encounter (Signed)
Pt discharged. Dx UTI.

## 2014-05-29 NOTE — Discharge Summary (Signed)
Physician Discharge Summary  Ashley [name] WNU:[id number] DOB: [birth year] DOA: 05/27/2014  PCP: [name], MD  Admit date: 05/27/2014 Discharge date: 05/29/2014  Time spent: <30 minutes  Recommendations for Outpatient Follow-up:  Discharge home with outpatient PCP follow-up She will be discharged on Keflex to complete a total 5 day course of antibiotic (complete on 1/18). please  follow urine culture results.  Discharge Diagnoses:  Principal Problem:   Abdominal pain, vomiting, and diarrhea  Active Problems:   Osteoarthritis   Cardiomyopathy, dilated, nonischemic   CKD (chronic kidney disease) stage 3, GFR 30-59 ml/min   ICD (implantable cardioverter-defibrillator) in place   Discharge Condition: Fair  Diet recommendation: heart healthy  Filed Weights   05/27/14 1729 05/28/14 2042  Weight: 71.215 kg (157 lb) 72.4 kg (159 lb 9.8 oz)    History of present illness:  Please refer to admission H&P for details, but in brief, 69 year old female with history of cardiomyopathy, history of AICD, chronic kidney disease stage III, fibromyalgia who presented to the ED with 1 day history of nausea, vomiting, diarrhea and abdominal pain. She reports having almost 22 episodes of vomiting of food and then clear liquid without any blood. Patient denies any fever but had chills. She reported using azithromycin 2 weeks back for sore throat. Denied any sick contacts or eating anything outside. She also reported mild chest discomfort but no shortness of breath. She denied any headache, cough, dysuria, urgency, frequency, hematuria or rash. Workup in the ED showed patient to be hypertensive with WBC of 15,000. CT of the abdomen showed bladder wall thickening. Patient admitted to telemetry for further management.  Hospital Course:  Acute viral gastroenteritis Nausea and vomiting resolved. Still has watery diarrhea. Recently treated with azithromycin 2 weeks back for sore throat. CT abdomen  unremarkable for colitis. Stool for C. difficile negative. Follow GI pathogen panel. Follow blood cultures. Received IV normal saline bolus on admission. Will reduce rate to 50 mL/h and discontinued after none.  Chronic CHF Currently hypovolemic. Last echo one month back showing EF of 50-50% with grade 1 diastolic dysfunction. Continue aspirin, coreg and Aldactone. Uses torsemide irregularly. Continue statin   CKD stage III Renal function at baseline  ? UTI UA positive for UTI. CT abdomen showing bladder wall thickening. Refused ciprofloxacin and placed on Keflex. Follow urine culture as outpt      Code Status: full code  Family Communication: none at bedside  Disposition Plan: home    Consultants:  none  Procedures:  none  Antibiotics:  Keflex until 1/18    Discharge Exam: Filed Vitals:   05/29/14 1017  BP: 110/53  Pulse: 82  Temp: 97.5 F (36.4 C)  Resp: 20    General: Elderly female lying in bed in no acute distress HEENT: No pallor, moist oral mucosa Chest: Clear to auscultation bilaterally CVS: Normal S1 and S2, no murmurs Abdomen: Soft, nondistended, nontender, bowel sounds present Extremities: Warm, no edema    Discharge Instructions    Current Discharge Medication List    START taking these medications   Details  ondansetron (ZOFRAN ODT) 4 MG disintegrating tablet Take 1 tablet (4 mg total) by mouth every 8 (eight) hours as needed for nausea or vomiting. Qty: 20 tablet, Refills: 0  Tab keflex 500mg                                    take 1 tablet (500 mg)  by mouth every 8 hours                                                                   Qty: 10 tablets, refills:0  CONTINUE these medications which have NOT CHANGED   Details  acetaminophen (TYLENOL) 325 MG tablet Take 325 mg by mouth 2 (two) times daily as needed for moderate pain.    alendronate (FOSAMAX) 70 MG tablet Take 70 mg by mouth every Thursday. Take with a full glass of  water on an empty stomach.    ALPRAZolam (XANAX) 0.25 MG tablet TAKE 1 TABLET BY MOUTH TWICE A DAY Qty: 60 tablet, Refills: 0    Ascorbic Acid (VITAMIN C) 1000 MG tablet Take 1,000 mg by mouth daily.    aspirin EC 81 MG tablet Take 81 mg by mouth at bedtime.     atorvastatin (LIPITOR) 40 MG tablet Take 1 tablet (40 mg total) by mouth daily. Qty: 30 tablet, Refills: 6    BIOTIN 5000 PO Take 5,000 mcg by mouth daily.    Calcium Carbonate-Vitamin D (CALCIUM + D PO) Take 1 tablet by mouth daily.    carvedilol (COREG) 12.5 MG tablet Take 1 tablet (12.5 mg total) by mouth 2 (two) times daily with a meal. Qty: 60 tablet, Refills: 3    Coenzyme Q10 (CO Q 10 PO) Take 1 tablet by mouth daily.    dextromethorphan (DELSYM) 30 MG/5ML liquid Take 30 mg by mouth as needed for cough.    Diphenhyd-Hydrocort-Nystatin (FIRST-DUKES MOUTHWASH MT) Use as directed 1 application in the mouth or throat as needed (for thrush).    omeprazole (PRILOSEC) 40 MG capsule Take 1 capsule (40 mg total) by mouth 2 (two) times daily before a meal. Qty: 60 capsule, Refills: 3    polyethylene glycol (MIRALAX / GLYCOLAX) packet Take 9 g by mouth daily.    PRESCRIPTION MEDICATION Place 1 application into both ears daily as needed (MED NAME: mometasone 0.1% SOLUTION (for eczema on ears)).    spironolactone (ALDACTONE) 25 MG tablet Take 25 mg by mouth every morning.    triamcinolone (NASACORT) 55 MCG/ACT AERO nasal inhaler Place 2 sprays into the nose 2 (two) times daily.    vitamin E 100 UNIT capsule Take 100 Units by mouth daily.    Wheat Dextrin (BENEFIBER DRINK MIX PO) Take 1 application by mouth daily.      STOP taking these medications     docusate sodium (COLACE) 100 MG capsule      simvastatin (ZOCOR) 40 MG tablet      azithromycin (ZITHROMAX Z-PAK) 250 MG tablet      torsemide (DEMADEX) 10 MG tablet        Allergies  Allergen Reactions  . Antihistamines, Chlorpheniramine-Type Other (See  Comments)    Makes her eyes look like she sees strobe lights  . Codeine Other (See Comments)    Confusion and dizziness  . Cyclobenzaprine Other (See Comments)    Adverse reaction - back and throat pain, dizziness, exhaustion  . Cymbalta [Duloxetine Hcl] Other (See Comments)    "body spasms and made me feel weird in my head"   . Influenza Vac Split [Flu Virus Vaccine] Other (See Comments)    "allergic to concentrated eggs that are  in vaccine"  . Penicillins Other (See Comments)    REACTION: "told 50 years ago I couldn't take it; might be immune to it", able to take amoxicillin  . Sulfasalazine Other (See Comments)    "makes my whole body smell like sulfa & makes me sick just smelling it"  . Atorvastatin Other (See Comments)    Severe muscle cramps to generic atorvastatin.  Able to take brand lipitor.  . Chlorhexidine Other (See Comments)    blisters  . Dilaudid [Hydromorphone Hcl] Other (See Comments)    Feels like she is burning internally   . Latex Nausea Only, Rash and Other (See Comments)    Pain and infection  . Morphine And Related Other (See Comments)    Internally feels like she is on fire   . Pravastatin Other (See Comments)    myalgias  . Prevacid [Lansoprazole] Other (See Comments)    Worsened GI side effects  . Red Dye Nausea Only and Swelling  . Rosuvastatin Other (See Comments)    Was not effective controlling lipids  . Simvastatin Other (See Comments)    Leg cramps  . Other     NOTE: pt is able to take cephalosporins without reaction  . Metaxalone Other (See Comments)    REACTION: unknown  . Tape Other (See Comments)    All adhesives    Follow-up Information    Follow up with [name], MD. Schedule an appointment as soon as possible for a visit in 2 days.   Specialty:  Family Medicine   Contact information:   [address] [place] [address] [phone]        The results of significant diagnostics from this hospitalization  (including imaging, microbiology, ancillary and laboratory) are listed below for reference.    Significant Diagnostic Studies: Ct Abdomen Pelvis W Contrast  05/27/2014   CLINICAL DATA:  Abdominal pain for 1 week, vomiting beginning today. Mid abdominal tenderness to palpation. History of surgery/bowel resection, diverticulitis.  EXAM: CT ABDOMEN AND PELVIS WITH CONTRAST  TECHNIQUE: Multidetector CT imaging of the abdomen and pelvis was performed using the standard protocol following bolus administration of intravenous contrast.  CONTRAST:  100 cc Omnipaque 300  COMPARISON:  CT of the abdomen and pelvis Sep 30, 2013  FINDINGS: LUNG BASES: Included view of the lung bases are clear. Partially imaged cardiac defibrillator leads. Visualized heart and pericardium are unremarkable.  SOLID ORGANS: The liver, spleen, gallbladder, and adrenal glands are unremarkable. Parallel apparent biliary structures in the pancreatic head suggest pancreatic divisum.  GASTROINTESTINAL TRACT: Small hiatal hernia. Rectosigmoid surgical anastomosis. Mild colonic diverticulosis. The stomach, small and large bowel are overall normal in course and caliber without inflammatory changes.  KIDNEYS/ URINARY TRACT: Kidneys are orthotopic, demonstrating symmetric enhancement. No nephrolithiasis, hydronephrosis or solid renal masses. Too small to characterize hypodensities in the kidneys bilaterally. Stable exophytic 11 mm cyst arising from the lower pole of RIGHT kidney. The unopacified ureters are normal in course and caliber. Delayed imaging through the kidneys demonstrates symmetric prompt contrast excretion within the proximal urinary collecting system. Urinary bladder is partially distended, circumferential wall thickening.  PERITONEUM/RETROPERITONEUM: No intraperitoneal free fluid nor free air. Aortoiliac vessels are normal in course and caliber, mild calcific atherosclerosis. No lymphadenopathy by CT size criteria. Status post hysterectomy.   SOFT TISSUE/OSSEOUS STRUCTURES: Nonsuspicious. Rectus abdominis diastases/very wide necked hernia with scarring.  IMPRESSION: Mild urinary bladder wall thickening likely reflects under distention though, cystitis could have a similar appearance.  Interval  partial colectomy with rectosigmoid bowel anastomosis, no CT findings of acute diverticulitis or bowel obstruction.   Electronically Signed   By: Awilda Metro   On: 05/27/2014 21:57    Microbiology: Recent Results (from the past 240 hour(s))  Clostridium Difficile by PCR     Status: None   Collection Time: 05/28/14  1:38 AM  Result Value Ref Range Status   C difficile by pcr NEGATIVE NEGATIVE Final  Culture, blood (routine x 2)     Status: None (Preliminary result)   Collection Time: 05/28/14  3:17 AM  Result Value Ref Range Status   Specimen Description BLOOD LEFT ARM  Final   Special Requests BOTTLES DRAWN AEROBIC AND ANAEROBIC 10CC EACH  Final   Culture   Final           BLOOD CULTURE RECEIVED NO GROWTH TO DATE CULTURE WILL BE HELD FOR 5 DAYS BEFORE ISSUING A FINAL NEGATIVE REPORT Performed at [facility]    Report Status PENDING  Incomplete  Culture, blood (routine x 2)     Status: None (Preliminary result)   Collection Time: 05/28/14  3:26 AM  Result Value Ref Range Status   Specimen Description BLOOD BLOOD LEFT FOREARM  Final   Special Requests BOTTLES DRAWN AEROBIC ONLY 10CC  Final   Culture   Final           BLOOD CULTURE RECEIVED NO GROWTH TO DATE CULTURE WILL BE HELD FOR 5 DAYS BEFORE ISSUING A FINAL NEGATIVE REPORT Performed at [facility]    Report Status PENDING  Incomplete     Labs: Basic Metabolic Panel:  Recent Labs Lab 05/27/14 0900 05/27/14 1736 05/28/14 0816  NA 141 143 144  K 4.5 4.3 3.9  CL 107 104 107  CO2 27 21 22   GLUCOSE 100* 103* 99  BUN 26* 22 15  CREATININE 0.93 0.98 0.90  CALCIUM 9.9 10.1 8.9   Liver Function Tests:  Recent Labs Lab 05/27/14 0900 05/27/14 1736   AST 17 28  ALT 16 21  ALKPHOS 110 109  BILITOT 0.2 0.5  PROT 8.2 8.1  ALBUMIN 4.4 4.4    Recent Labs Lab 05/27/14 1736  LIPASE 40   No results for input(s): AMMONIA in the last 168 hours. CBC:  Recent Labs Lab 05/27/14 0900 05/27/14 1736 05/28/14 0816  WBC 10.1 15.0* 6.8  NEUTROABS 7.4 13.8*  --   HGB 11.5* 12.1 9.8*  HCT 35.5* 36.5 30.4*  MCV 86.9 86.5 89.1  PLT 295.0 178 183   Cardiac Enzymes:  Recent Labs Lab 05/27/14 0900 05/28/14 0317 05/28/14 0816 05/28/14 1615  CKTOTAL 103 83  --   --   TROPONINI  --  <0.03 <0.03 <0.03   BNP: BNP (last 3 results) No results for input(s): PROBNP in the last 8760 hours. CBG:  Recent Labs Lab 05/28/14 0228 05/28/14 0800 05/29/14 0818  GLUCAP 93 89 96       Signed:  Stephan Nelis  [facility] 05/29/2014, 11:38 AM

## 2014-05-29 NOTE — Care Management Note (Signed)
CARE MANAGEMENT NOTE 05/29/2014  Patient:  Ashley [name],Ashley [name]   Account Number:  [id number]  Date Initiated:  05/29/2014  Documentation initiated by:  ,  Subjective/Objective Assessment:   CM following for progression and d/c planning.     Action/Plan:   Met with pt no HH or DME needs identified. Pt for d/c today.   Anticipated DC Date:  05/29/2014   Anticipated DC Plan:  HOME/SELF CARE         Choice offered to / List presented to:             Status of service:  Completed, signed off Medicare Important Message given?  YES (If response is "NO", the following Medicare IM given date fields will be blank) Date Medicare IM given:  05/29/2014 Medicare IM given by:  , Date Additional Medicare IM given:   Additional Medicare IM given by:    Discharge Disposition:  HOME/SELF CARE  Per UR Regulation:    If discussed at Long Length of Stay Meetings, dates discussed:    Comments:    

## 2014-05-31 ENCOUNTER — Telehealth: Payer: Self-pay | Admitting: Family Medicine

## 2014-05-31 LAB — URINE CULTURE: Colony Count: 15000

## 2014-05-31 NOTE — Telephone Encounter (Signed)
plz call Monday for hosp f/u phone call. I belive pt has appt 1/22. Admitted with abd pain, nausea/vomiting and diarrhea, found to have UTI and on keflex.

## 2014-06-01 NOTE — Telephone Encounter (Signed)
Spoke with patient. No symptoms of UTI currently, but is still taking abx just in case. Abd pain,n/v and diarrhea are all better and she has follow up scheduled for 06/05/14.

## 2014-06-02 LAB — GI PATHOGEN PANEL BY PCR, STOOL
C difficile toxin A/B: NOT DETECTED
Campylobacter by PCR: NOT DETECTED
Cryptosporidium by PCR: NOT DETECTED
E coli (ETEC) LT/ST: NOT DETECTED
E coli (STEC): NOT DETECTED
E coli 0157 by PCR: NOT DETECTED
G lamblia by PCR: NOT DETECTED
Rotavirus A by PCR: NOT DETECTED
Salmonella by PCR: NOT DETECTED
Shigella by PCR: NOT DETECTED

## 2014-06-03 LAB — CULTURE, BLOOD (ROUTINE X 2)
Culture: NO GROWTH
Culture: NO GROWTH

## 2014-06-05 ENCOUNTER — Ambulatory Visit: Payer: Medicare Other | Admitting: Family Medicine

## 2014-06-09 ENCOUNTER — Ambulatory Visit (INDEPENDENT_AMBULATORY_CARE_PROVIDER_SITE_OTHER): Payer: Medicare Other | Admitting: Family Medicine

## 2014-06-09 ENCOUNTER — Encounter: Payer: Self-pay | Admitting: Family Medicine

## 2014-06-09 VITALS — BP 108/68 | HR 80 | Temp 97.8°F | Wt 159.2 lb

## 2014-06-09 DIAGNOSIS — R197 Diarrhea, unspecified: Secondary | ICD-10-CM

## 2014-06-09 DIAGNOSIS — N3000 Acute cystitis without hematuria: Secondary | ICD-10-CM

## 2014-06-09 DIAGNOSIS — N183 Chronic kidney disease, stage 3 unspecified: Secondary | ICD-10-CM

## 2014-06-09 DIAGNOSIS — D500 Iron deficiency anemia secondary to blood loss (chronic): Secondary | ICD-10-CM | POA: Diagnosis not present

## 2014-06-09 DIAGNOSIS — R1110 Vomiting, unspecified: Secondary | ICD-10-CM | POA: Diagnosis not present

## 2014-06-09 DIAGNOSIS — I5022 Chronic systolic (congestive) heart failure: Secondary | ICD-10-CM

## 2014-06-09 DIAGNOSIS — R109 Unspecified abdominal pain: Secondary | ICD-10-CM

## 2014-06-09 NOTE — Patient Instructions (Addendum)
Decrease miralax and fiber over next few days - if constipation developing may increase to prior dose again.  Norovirus Infection Norovirus illness is caused by a viral infection. The term norovirus refers to a group of viruses. Any of those viruses can cause norovirus illness. This illness is often referred to by other names such as viral gastroenteritis, stomach flu, and food poisoning. Anyone can get a norovirus infection. People can have the illness multiple times during their lifetime. CAUSES  Norovirus is found in the stool or vomit of infected people. It is easily spread from person to person (contagious). People with norovirus are contagious from the moment they begin feeling ill. They may remain contagious for as long as 3 days to 2 weeks after recovery. People can become infected with the virus in several ways. This includes:  Eating food or drinking liquids that are contaminated with norovirus.  Touching surfaces or objects contaminated with norovirus, and then placing your hand in your mouth.  Having direct contact with a person who is infected and shows symptoms. This may occur while caring for someone with illness or while sharing foods or eating utensils with someone who is ill. SYMPTOMS  Symptoms usually begin 1 to 2 days after ingestion of the virus. Symptoms may include:  Nausea.  Vomiting.  Diarrhea.  Stomach cramps.  Low-grade fever.  Chills.  Headache.  Muscle aches.  Tiredness. Most people with norovirus illness get better within 1 to 2 days. Some people become dehydrated because they cannot drink enough liquids to replace those lost from vomiting and diarrhea. This is especially true for young children, the elderly, and others who are unable to care for themselves. DIAGNOSIS  Diagnosis is based on your symptoms and exam. Currently, only state public health laboratories have the ability to test for norovirus in stool or vomit. TREATMENT  No specific  treatment exists for norovirus infections. No vaccine is available to prevent infections. Norovirus illness is usually brief in healthy people. If you are ill with vomiting and diarrhea, you should drink enough water and fluids to keep your urine clear or pale yellow. Dehydration is the most serious health effect that can result from this infection. By drinking oral rehydration solution (ORS), people can reduce their chance of becoming dehydrated. There are many commercially available pre-made and powdered ORS designed to safely rehydrate people. These may be recommended by your caregiver. Replace any new fluid losses from diarrhea or vomiting with ORS as follows:  If your child weighs 10 kg or less (22 lb or less), give 60 to 120 ml ( to  cup or 2 to 4 oz) of ORS for each diarrheal stool or vomiting episode.  If your child weighs more than 10 kg (more than 22 lb), give 120 to 240 ml ( to 1 cup or 4 to 8 oz) of ORS for each diarrheal stool or vomiting episode. HOME CARE INSTRUCTIONS   Follow all your caregiver's instructions.  Avoid sugar-free and alcoholic drinks while ill.  Only take over-the-counter or prescription medicines for pain, vomiting, diarrhea, or fever as directed by your caregiver. You can decrease your chances of coming in contact with norovirus or spreading it by following these steps:  Frequently wash your hands, especially after using the toilet, changing diapers, and before eating or preparing food.  Carefully wash fruits and vegetables. Cook shellfish before eating them.  Do not prepare food for others while you are infected and for at least 3 days after recovering from illness.  Thoroughly clean and disinfect contaminated surfaces immediately after an episode of illness using a bleach-based household cleaner.  Immediately remove and wash clothing or linens that may be contaminated with the virus.  Use the toilet to dispose of any vomit or stool. Make sure the  surrounding area is kept clean.  Food that may have been contaminated by an ill person should be discarded. SEEK IMMEDIATE MEDICAL CARE IF:   You develop symptoms of dehydration that do not improve with fluid replacement. This may include:  Excessive sleepiness.  Lack of tears.  Dry mouth.  Dizziness when standing.  Weak pulse. Document Released: 07/22/2002 Document Revised: 07/24/2011 Document Reviewed: 08/23/2009 [facility] Patient Information 2015 [place], [place]. This information is not intended to replace advice given to you by your health care provider. Make sure you discuss any questions you have with your health care provider.

## 2014-06-09 NOTE — Assessment & Plan Note (Signed)
euvolemic today.  Remains off torsemide.

## 2014-06-09 NOTE — Assessment & Plan Note (Signed)
15k enterococcal UTI, completed 5d keflex and asxs from UTI standpoint. Did not recheck UA or culture today as pt asymptomatic.

## 2014-06-09 NOTE — Progress Notes (Signed)
Pre visit review using our clinic review tool, if applicable. No additional management support is needed unless otherwise documented below in the visit note. 

## 2014-06-09 NOTE — Assessment & Plan Note (Signed)
This has largely resolved. She had acute norovirus gastroenteritis. Discussed this. Discussed finding right balance for her on dose of miralax and benefiber supplements daily, suggested decreasing both a bit daily given diarrhea noted today. No evidence of persistent infection.

## 2014-06-09 NOTE — Assessment & Plan Note (Signed)
Anemia to 9.8 during hospitalization noted, anticipate dilutional effect as all blood lines decreased. Check CBC next lab. Pt agrees with plan.

## 2014-06-09 NOTE — Assessment & Plan Note (Signed)
Kidney function improved as of last hospital Cr. Continue to monitor, consider checking labs next visit.

## 2014-06-09 NOTE — Progress Notes (Signed)
BP 108/68 mmHg  Pulse 80  Temp(Src) 97.8 F (36.6 C) (Oral)  Wt 159 lb 4 oz (72.235 kg)  SpO2 97%   CC: hospital f/u visit  Subjective:    Patient ID: Ashley [name], female    DOB: 1946-03-01, 69 y.o.   MRN: [id number]  HPI: Ashley [name] is a 69 y.o. female presenting on 06/09/2014 for Follow-up   Recent hospitalization at [facility] with nausea/vomiting and diarrhea with abdominal pain. Records reviewed. Thought had UTI and acute viral gastroenteritis. Workup as below. WBC 15k, CT abdomen showed bladder wall thickening, unremarkable for colitis. GI pathogen panel returned positive for norovirus.  Diarrhea restarted this morning x2, but this was after miralax and fiber. Saw chiropractor yesterday for sciatica flare. Appetite normal. No fevers/chills, no nausea/vomiting. Persistent abdominal pain, unchanged.  UTI - with urine culture growing 15k enterococcus S ampicillin, S levo, S nitro, S vanc, R tetracyclines.  Pt completed 5d keflex course. Sensitivity to cephalosporins was not tested. Pt denies any UTI sxs.  Blood culture x2 no growth final.  C diff neg.   FLP from prior to hospitalization reviewed with patient - significantly improved.   She saw Dr Olevia Perches 05/2014. Will need colonsocopy in future for pos hemoccult. Has f/u with her 3 mo. Using benefiber and miralax regularly.  Recent trip to [facility], significant abdominal bloating.   Admit date: 05/27/2014 Discharge date: 05/29/2014 F/u phone call: 06/01/2014 Recommendations for Outpatient Follow-up:  Discharge home with outpatient PCP follow-up She will be discharged on Keflex to complete a total 5 day course of antibiotic (complete on 1/18). please follow urine culture results.  Discharge Diagnoses:  Principal Problem:  Abdominal pain, vomiting, and diarrhea  Active Problems:  Osteoarthritis  Cardiomyopathy, dilated, nonischemic  CKD (chronic kidney disease) stage 3, GFR 30-59 ml/min  ICD  (implantable cardioverter-defibrillator) in place  Discharge Condition: Fair  Relevant past medical, surgical, family and social history reviewed and updated as indicated. Interim medical history since our last visit reviewed. Allergies and medications reviewed and updated. Current Outpatient Prescriptions on File Prior to Visit  Medication Sig  . acetaminophen (TYLENOL) 325 MG tablet Take 325 mg by mouth 2 (two) times daily as needed for moderate pain.  Marland Kitchen ALPRAZolam (XANAX) 0.25 MG tablet TAKE 1 TABLET BY MOUTH TWICE A DAY  . Ascorbic Acid (VITAMIN C) 1000 MG tablet Take 1,000 mg by mouth daily.  Marland Kitchen aspirin EC 81 MG tablet Take 81 mg by mouth at bedtime.   Marland Kitchen atorvastatin (LIPITOR) 40 MG tablet Take 1 tablet (40 mg total) by mouth daily. (Patient taking differently: Take 40 mg by mouth every other day. )  . BIOTIN 5000 PO Take 5,000 mcg by mouth daily.  . Calcium Carbonate-Vitamin D (CALCIUM + D PO) Take 1 tablet by mouth daily.  . carvedilol (COREG) 12.5 MG tablet Take 1 tablet (12.5 mg total) by mouth 2 (two) times daily with a meal.  . Coenzyme Q10 (CO Q 10 PO) Take 1 tablet by mouth daily.  Marland Kitchen omeprazole (PRILOSEC) 40 MG capsule Take 1 capsule (40 mg total) by mouth 2 (two) times daily before a meal.  . polyethylene glycol (MIRALAX / GLYCOLAX) packet Take 9 g by mouth daily.  Marland Kitchen spironolactone (ALDACTONE) 25 MG tablet Take 25 mg by mouth every morning.  . triamcinolone (NASACORT) 55 MCG/ACT AERO nasal inhaler Place 2 sprays into the nose 2 (two) times daily.  . vitamin E 100 UNIT capsule Take 100 Units by mouth  daily.  . Wheat Dextrin (BENEFIBER DRINK MIX PO) Take 1 application by mouth daily.  Marland Kitchen alendronate (FOSAMAX) 70 MG tablet Take 70 mg by mouth every Thursday. Take with a full glass of water on an empty stomach.  . dextromethorphan (DELSYM) 30 MG/5ML liquid Take 30 mg by mouth as needed for cough.  . ondansetron (ZOFRAN ODT) 4 MG disintegrating tablet Take 1 tablet (4 mg total) by  mouth every 8 (eight) hours as needed for nausea or vomiting. (Patient not taking: Reported on 06/09/2014)  . PRESCRIPTION MEDICATION Place 1 application into both ears daily as needed (MED NAME: mometasone 0.1% SOLUTION (for eczema on ears)).  . [DISCONTINUED] bisoprolol (ZEBETA) 5 MG tablet Take 5 mg by mouth daily.   No current facility-administered medications on file prior to visit.    Review of Systems Per HPI unless specifically indicated above     Objective:    BP 108/68 mmHg  Pulse 80  Temp(Src) 97.8 F (36.6 C) (Oral)  Wt 159 lb 4 oz (72.235 kg)  SpO2 97%  Wt Readings from Last 3 Encounters:  06/09/14 159 lb 4 oz (72.235 kg)  05/28/14 159 lb 9.8 oz (72.4 kg)  05/19/14 161 lb 2 oz (73.086 kg)    Physical Exam  Constitutional: She appears well-developed and well-nourished. No distress.  HENT:  Mouth/Throat: Oropharynx is clear and moist. No oropharyngeal exudate.  Eyes: Conjunctivae and EOM are normal. Pupils are equal, round, and reactive to light. No scleral icterus.  Neck: Normal range of motion. Carotid bruit is not present.  Cardiovascular: Normal rate, regular rhythm, normal heart sounds and intact distal pulses.   No murmur heard. Pulmonary/Chest: Effort normal and breath sounds normal. No respiratory distress. She has no wheezes. She has no rales.  Abdominal: Soft. Bowel sounds are normal. She exhibits no distension and no mass. There is no hepatosplenomegaly. There is generalized tenderness. There is no rigidity, no rebound, no guarding and negative Murphy's sign.  Predominant tenderness bilateral lower quadrants and LUQ. Scars throughout abdomen from prior surgeries  Musculoskeletal: She exhibits no edema.  Skin: Skin is warm. No rash noted.  Nursing note and vitals reviewed.      Assessment & Plan:   Problem List Items Addressed This Visit    UTI (urinary tract infection)    15k enterococcal UTI, completed 5d keflex and asxs from UTI standpoint. Did not  recheck UA or culture today as pt asymptomatic.      CKD (chronic kidney disease) stage 3, GFR 30-59 ml/min    Kidney function improved as of last hospital Cr. Continue to monitor, consider checking labs next visit.      Chronic systolic CHF (congestive heart failure)    euvolemic today.  Remains off torsemide.      Anemia    Anemia to 9.8 during hospitalization noted, anticipate dilutional effect as all blood lines decreased. Check CBC next lab. Pt agrees with plan.      Abdominal pain, vomiting, and diarrhea - Primary    This has largely resolved. She had acute norovirus gastroenteritis. Discussed this. Discussed finding right balance for her on dose of miralax and benefiber supplements daily, suggested decreasing both a bit daily given diarrhea noted today. No evidence of persistent infection.          Follow up plan: Return in about 3 months (around 09/08/2014), or if symptoms worsen or fail to improve, for follow up visit.

## 2014-06-12 ENCOUNTER — Other Ambulatory Visit: Payer: Self-pay | Admitting: *Deleted

## 2014-06-12 MED ORDER — OMEPRAZOLE 40 MG PO CPDR: 40.0000 mg | DELAYED_RELEASE_CAPSULE | Freq: Two times a day (BID) | ORAL | Status: DC

## 2014-06-12 MED ORDER — CARVEDILOL 12.5 MG PO TABS: 12.5000 mg | ORAL_TABLET | Freq: Two times a day (BID) | ORAL | Status: DC

## 2014-06-12 MED ORDER — ATORVASTATIN CALCIUM 40 MG PO TABS: 40.0000 mg | ORAL_TABLET | ORAL | Status: DC

## 2014-06-18 DIAGNOSIS — M5136 Other intervertebral disc degeneration, lumbar region: Secondary | ICD-10-CM | POA: Diagnosis not present

## 2014-06-18 DIAGNOSIS — M62462 Contracture of muscle, left lower leg: Secondary | ICD-10-CM | POA: Diagnosis not present

## 2014-06-18 DIAGNOSIS — M9907 Segmental and somatic dysfunction of upper extremity: Secondary | ICD-10-CM | POA: Diagnosis not present

## 2014-06-18 DIAGNOSIS — M9901 Segmental and somatic dysfunction of cervical region: Secondary | ICD-10-CM | POA: Diagnosis not present

## 2014-06-18 DIAGNOSIS — M9903 Segmental and somatic dysfunction of lumbar region: Secondary | ICD-10-CM | POA: Diagnosis not present

## 2014-06-18 DIAGNOSIS — M9906 Segmental and somatic dysfunction of lower extremity: Secondary | ICD-10-CM | POA: Diagnosis not present

## 2014-06-18 DIAGNOSIS — M9902 Segmental and somatic dysfunction of thoracic region: Secondary | ICD-10-CM | POA: Diagnosis not present

## 2014-06-18 DIAGNOSIS — M5135 Other intervertebral disc degeneration, thoracolumbar region: Secondary | ICD-10-CM | POA: Diagnosis not present

## 2014-06-22 DIAGNOSIS — M9903 Segmental and somatic dysfunction of lumbar region: Secondary | ICD-10-CM | POA: Diagnosis not present

## 2014-06-22 DIAGNOSIS — M9902 Segmental and somatic dysfunction of thoracic region: Secondary | ICD-10-CM | POA: Diagnosis not present

## 2014-06-22 DIAGNOSIS — M62462 Contracture of muscle, left lower leg: Secondary | ICD-10-CM | POA: Diagnosis not present

## 2014-06-22 DIAGNOSIS — M9906 Segmental and somatic dysfunction of lower extremity: Secondary | ICD-10-CM | POA: Diagnosis not present

## 2014-06-22 DIAGNOSIS — M5135 Other intervertebral disc degeneration, thoracolumbar region: Secondary | ICD-10-CM | POA: Diagnosis not present

## 2014-06-22 DIAGNOSIS — M9907 Segmental and somatic dysfunction of upper extremity: Secondary | ICD-10-CM | POA: Diagnosis not present

## 2014-06-22 DIAGNOSIS — M5136 Other intervertebral disc degeneration, lumbar region: Secondary | ICD-10-CM | POA: Diagnosis not present

## 2014-06-22 DIAGNOSIS — M9901 Segmental and somatic dysfunction of cervical region: Secondary | ICD-10-CM | POA: Diagnosis not present

## 2014-06-23 ENCOUNTER — Telehealth: Payer: Self-pay | Admitting: Family Medicine

## 2014-06-23 NOTE — Telephone Encounter (Signed)
Pt already has appt with Dr Darnell Level on 06/25/14 at 10:45

## 2014-06-23 NOTE — Telephone Encounter (Signed)
Patient Name: Ashley [name]  DOB: Mar 27, 1946    Initial Comment Caller states she's depressed, gained weight, and feels clammy.    Nurse Assessment      Guidelines    Guideline Title Affirmed Question Affirmed Notes       Final Disposition User   FINAL ATTEMPT MADE - no message left [place], Therapist, sports, [employer]

## 2014-06-24 NOTE — Telephone Encounter (Signed)
Will see tomorrow

## 2014-06-25 ENCOUNTER — Encounter: Payer: Self-pay | Admitting: Family Medicine

## 2014-06-25 ENCOUNTER — Ambulatory Visit (INDEPENDENT_AMBULATORY_CARE_PROVIDER_SITE_OTHER): Payer: Medicare Other | Admitting: Family Medicine

## 2014-06-25 VITALS — BP 112/72 | HR 82 | Temp 97.8°F | Wt 164.5 lb

## 2014-06-25 DIAGNOSIS — K589 Irritable bowel syndrome without diarrhea: Secondary | ICD-10-CM

## 2014-06-25 DIAGNOSIS — F411 Generalized anxiety disorder: Secondary | ICD-10-CM | POA: Diagnosis not present

## 2014-06-25 DIAGNOSIS — I5022 Chronic systolic (congestive) heart failure: Secondary | ICD-10-CM

## 2014-06-25 MED ORDER — POTASSIUM CHLORIDE CRYS ER 10 MEQ PO TBCR: 10.0000 meq | EXTENDED_RELEASE_TABLET | Freq: Every day | ORAL | Status: DC | PRN

## 2014-06-25 MED ORDER — ALPRAZOLAM 0.25 MG PO TABS: 0.2500 mg | ORAL_TABLET | Freq: Two times a day (BID) | ORAL | Status: DC | PRN

## 2014-06-25 MED ORDER — TORSEMIDE 20 MG PO TABS: 20.0000 mg | ORAL_TABLET | Freq: Every day | ORAL | Status: DC | PRN

## 2014-06-25 NOTE — Progress Notes (Signed)
Pre visit review using our clinic review tool, if applicable. No additional management support is needed unless otherwise documented below in the visit note. 

## 2014-06-25 NOTE — Assessment & Plan Note (Signed)
euvolemic today. Doubt weight gain related to excess fluid and more related to constipation issues - but will monitor and if persistent weight gain noted, discussed ok to restart torsemide 20mg  + Kdur 35mEq PRN weight gain >3 lbs.

## 2014-06-25 NOTE — Assessment & Plan Note (Addendum)
Increasing anxiety issues recently - will restart xanax prn. If persistent anxiety, will start zoloft 25mg  daily. Pt agrees with plan.

## 2014-06-25 NOTE — Assessment & Plan Note (Signed)
Anticipate IBS related bowel irregularities - as well as multiple recent abx courses likely have stripped normal gut flora. Will start probiotic daily and monitor for improvement over next few weeks. Pt agrees with plan.

## 2014-06-25 NOTE — Progress Notes (Signed)
BP 112/72 mmHg  Pulse 82  Temp(Src) 97.8 F (36.6 C) (Oral)  Wt 164 lb 8 oz (74.617 kg)   CC: weight gain, anxiety  Subjective:    Patient ID: Ashley [name], female    DOB: [birth year], 69 y.o.   MRN: [id number]  HPI: Ashley [name] is a 69 y.o. female presenting on 06/25/2014 for Anxiety and Weight Gain   Worried about 11lb weight gain over last month, but on our scales only 5lb documented. Previous dry weight was 155-160lbs.   Staying anxious about stool regimen - not regular. Currently takes fiber in afternoon + 3/4 miralax capful daily. Alternates between constipation and diarrhea with urgency. Anxiety has worsened, feels emotional lability. A few days ago felt clammy, faint. No LOC. Feels fatigued - increased need for naps during the day. Intermittent chills. + bloating. Passing gas well. Last BM was this morning, formed and normal.  Has noticed increased appetite but not stooling as much as she would expect. She has been on several courses of antibiotics over last few months. Prior was on [facility].   No fevers, significant abd pain. No leg swelling or dyspnea.   Relevant past medical, surgical, family and social history reviewed and updated as indicated. Interim medical history since our last visit reviewed. Allergies and medications reviewed and updated. Current Outpatient Prescriptions on File Prior to Visit  Medication Sig  . acetaminophen (TYLENOL) 325 MG tablet Take 325 mg by mouth 2 (two) times daily as needed for moderate pain.  Marland Kitchen alendronate (FOSAMAX) 70 MG tablet Take 70 mg by mouth every Thursday. Take with a full glass of water on an empty stomach.  . Ascorbic Acid (VITAMIN C) 1000 MG tablet Take 1,000 mg by mouth daily.  Marland Kitchen aspirin EC 81 MG tablet Take 81 mg by mouth at bedtime.   Marland Kitchen atorvastatin (LIPITOR) 40 MG tablet Take 1 tablet (40 mg total) by mouth every other day.  Marland Kitchen BIOTIN 5000 PO Take 5,000 mcg by mouth daily.  . Calcium Carbonate-Vitamin D  (CALCIUM + D PO) Take 1 tablet by mouth daily.  . carvedilol (COREG) 12.5 MG tablet Take 1 tablet (12.5 mg total) by mouth 2 (two) times daily with a meal.  . Coenzyme Q10 (CO Q 10 PO) Take 1 tablet by mouth daily.  Marland Kitchen dextromethorphan (DELSYM) 30 MG/5ML liquid Take 30 mg by mouth as needed for cough.  Marland Kitchen omeprazole (PRILOSEC) 40 MG capsule Take 1 capsule (40 mg total) by mouth 2 (two) times daily before a meal.  . ondansetron (ZOFRAN ODT) 4 MG disintegrating tablet Take 1 tablet (4 mg total) by mouth every 8 (eight) hours as needed for nausea or vomiting.  . polyethylene glycol (MIRALAX / GLYCOLAX) packet Take 9 g by mouth daily.  Marland Kitchen PRESCRIPTION MEDICATION Place 1 application into both ears daily as needed (MED NAME: mometasone 0.1% SOLUTION (for eczema on ears)).  Marland Kitchen spironolactone (ALDACTONE) 25 MG tablet Take 25 mg by mouth every morning.  . triamcinolone (NASACORT) 55 MCG/ACT AERO nasal inhaler Place 2 sprays into the nose 2 (two) times daily.  . vitamin E 100 UNIT capsule Take 100 Units by mouth daily.  . Wheat Dextrin (BENEFIBER DRINK MIX PO) Take 1 application by mouth daily.  . [DISCONTINUED] bisoprolol (ZEBETA) 5 MG tablet Take 5 mg by mouth daily.   No current facility-administered medications on file prior to visit.    Review of Systems Per HPI unless specifically indicated above     Objective:  BP 112/72 mmHg  Pulse 82  Temp(Src) 97.8 F (36.6 C) (Oral)  Wt 164 lb 8 oz (74.617 kg)  Wt Readings from Last 3 Encounters:  06/25/14 164 lb 8 oz (74.617 kg)  06/09/14 159 lb 4 oz (72.235 kg)  05/28/14 159 lb 9.8 oz (72.4 kg)    Physical Exam  Constitutional: She appears well-developed and well-nourished. No distress.  HENT:  Mouth/Throat: Oropharynx is clear and moist. No oropharyngeal exudate.  Cardiovascular: Normal rate, regular rhythm, normal heart sounds and intact distal pulses.   No murmur heard. Pulmonary/Chest: Effort normal and breath sounds normal. No respiratory  distress. She has no wheezes. She has no rales.  No crackles  Abdominal: Soft. Bowel sounds are normal. She exhibits no distension and no mass. There is no tenderness. There is no rebound and no guarding.  Scarring of abdomen from recent suregeries  Musculoskeletal: She exhibits no edema.  Psychiatric: She has a normal mood and affect.  Nursing note and vitals reviewed.      Assessment & Plan:   Problem List Items Addressed This Visit    Irritable bowel syndrome - Primary    Anticipate IBS related bowel irregularities - as well as multiple recent abx courses likely have stripped normal gut flora. Will start probiotic daily and monitor for improvement over next few weeks. Pt agrees with plan.      Relevant Medications   Probiotic Product (PROBIOTIC DAILY) CAPS   Chronic systolic CHF (congestive heart failure)    euvolemic today. Doubt weight gain related to excess fluid and more related to constipation issues - but will monitor and if persistent weight gain noted, discussed ok to restart torsemide 20mg  + Kdur 20mEq PRN weight gain >3 lbs.      Relevant Medications   torsemide (DEMADEX) tablet   Anxiety state    Increasing anxiety issues recently - will restart xanax prn. If persistent anxiety, will start zoloft 25mg  daily. Pt agrees with plan.      Relevant Medications   ALPRAZolam  (XANAX) tablet       Follow up plan: Return as needed.

## 2014-06-25 NOTE — Patient Instructions (Addendum)
Restart daily probiotic. Restart alprazolam (xanax) 0.25mg  1/2 tablet twice daily as needed for anxiety. If still noticing trouble with mood, let me know to start zoloft again. For weight - I think this is constipation related and not fluid or heart related.  If stools are normalizing on probiotic, and you continue to see weight gain, may take torsemide 20mg  one daily as needed if >3 lbs in 24 hours.  Update me with how you're feeling over next 1-2 weeks.

## 2014-06-30 DIAGNOSIS — M9902 Segmental and somatic dysfunction of thoracic region: Secondary | ICD-10-CM | POA: Diagnosis not present

## 2014-06-30 DIAGNOSIS — M9901 Segmental and somatic dysfunction of cervical region: Secondary | ICD-10-CM | POA: Diagnosis not present

## 2014-06-30 DIAGNOSIS — M9906 Segmental and somatic dysfunction of lower extremity: Secondary | ICD-10-CM | POA: Diagnosis not present

## 2014-06-30 DIAGNOSIS — M5136 Other intervertebral disc degeneration, lumbar region: Secondary | ICD-10-CM | POA: Diagnosis not present

## 2014-06-30 DIAGNOSIS — M5135 Other intervertebral disc degeneration, thoracolumbar region: Secondary | ICD-10-CM | POA: Diagnosis not present

## 2014-06-30 DIAGNOSIS — M9907 Segmental and somatic dysfunction of upper extremity: Secondary | ICD-10-CM | POA: Diagnosis not present

## 2014-06-30 DIAGNOSIS — M9903 Segmental and somatic dysfunction of lumbar region: Secondary | ICD-10-CM | POA: Diagnosis not present

## 2014-06-30 DIAGNOSIS — M62462 Contracture of muscle, left lower leg: Secondary | ICD-10-CM | POA: Diagnosis not present

## 2014-07-06 DIAGNOSIS — M5136 Other intervertebral disc degeneration, lumbar region: Secondary | ICD-10-CM | POA: Diagnosis not present

## 2014-07-06 DIAGNOSIS — M5135 Other intervertebral disc degeneration, thoracolumbar region: Secondary | ICD-10-CM | POA: Diagnosis not present

## 2014-07-06 DIAGNOSIS — M9901 Segmental and somatic dysfunction of cervical region: Secondary | ICD-10-CM | POA: Diagnosis not present

## 2014-07-06 DIAGNOSIS — M9902 Segmental and somatic dysfunction of thoracic region: Secondary | ICD-10-CM | POA: Diagnosis not present

## 2014-07-06 DIAGNOSIS — M62462 Contracture of muscle, left lower leg: Secondary | ICD-10-CM | POA: Diagnosis not present

## 2014-07-06 DIAGNOSIS — M9907 Segmental and somatic dysfunction of upper extremity: Secondary | ICD-10-CM | POA: Diagnosis not present

## 2014-07-06 DIAGNOSIS — M9906 Segmental and somatic dysfunction of lower extremity: Secondary | ICD-10-CM | POA: Diagnosis not present

## 2014-07-06 DIAGNOSIS — M9903 Segmental and somatic dysfunction of lumbar region: Secondary | ICD-10-CM | POA: Diagnosis not present

## 2014-07-13 DIAGNOSIS — M5136 Other intervertebral disc degeneration, lumbar region: Secondary | ICD-10-CM | POA: Diagnosis not present

## 2014-07-13 DIAGNOSIS — M62462 Contracture of muscle, left lower leg: Secondary | ICD-10-CM | POA: Diagnosis not present

## 2014-07-13 DIAGNOSIS — M9902 Segmental and somatic dysfunction of thoracic region: Secondary | ICD-10-CM | POA: Diagnosis not present

## 2014-07-13 DIAGNOSIS — M9901 Segmental and somatic dysfunction of cervical region: Secondary | ICD-10-CM | POA: Diagnosis not present

## 2014-07-13 DIAGNOSIS — M9906 Segmental and somatic dysfunction of lower extremity: Secondary | ICD-10-CM | POA: Diagnosis not present

## 2014-07-13 DIAGNOSIS — M9903 Segmental and somatic dysfunction of lumbar region: Secondary | ICD-10-CM | POA: Diagnosis not present

## 2014-07-13 DIAGNOSIS — M5135 Other intervertebral disc degeneration, thoracolumbar region: Secondary | ICD-10-CM | POA: Diagnosis not present

## 2014-07-13 DIAGNOSIS — M9907 Segmental and somatic dysfunction of upper extremity: Secondary | ICD-10-CM | POA: Diagnosis not present

## 2014-07-20 DIAGNOSIS — M5136 Other intervertebral disc degeneration, lumbar region: Secondary | ICD-10-CM | POA: Diagnosis not present

## 2014-07-20 DIAGNOSIS — M9902 Segmental and somatic dysfunction of thoracic region: Secondary | ICD-10-CM | POA: Diagnosis not present

## 2014-07-20 DIAGNOSIS — M9906 Segmental and somatic dysfunction of lower extremity: Secondary | ICD-10-CM | POA: Diagnosis not present

## 2014-07-20 DIAGNOSIS — M9907 Segmental and somatic dysfunction of upper extremity: Secondary | ICD-10-CM | POA: Diagnosis not present

## 2014-07-20 DIAGNOSIS — M9903 Segmental and somatic dysfunction of lumbar region: Secondary | ICD-10-CM | POA: Diagnosis not present

## 2014-07-20 DIAGNOSIS — M62462 Contracture of muscle, left lower leg: Secondary | ICD-10-CM | POA: Diagnosis not present

## 2014-07-20 DIAGNOSIS — M9901 Segmental and somatic dysfunction of cervical region: Secondary | ICD-10-CM | POA: Diagnosis not present

## 2014-07-20 DIAGNOSIS — M5135 Other intervertebral disc degeneration, thoracolumbar region: Secondary | ICD-10-CM | POA: Diagnosis not present

## 2014-07-28 ENCOUNTER — Telehealth: Payer: Self-pay | Admitting: Family Medicine

## 2014-07-28 DIAGNOSIS — D500 Iron deficiency anemia secondary to blood loss (chronic): Secondary | ICD-10-CM

## 2014-07-28 DIAGNOSIS — I5022 Chronic systolic (congestive) heart failure: Secondary | ICD-10-CM

## 2014-07-28 NOTE — Telephone Encounter (Signed)
Pt called to inform you she has gained 8-10 pounds since the last visit on 06/25/14. Pt states she is not supposed to weigh over 152-155 according to heart specialist is her ideal weight. She is weighing the same time every day. He weight this morning 166. Pt states she has not changed her eating habits. Has not had normal bowel movements. She had 2 "explosive" episodes and then there are days where she is constipated. She is taking miralax.   Pt states she can come in tomorrow afternoon, Thursday morning or any time on Friday.  Depression is getting better with Xanax. She is not as sad. Please advise.

## 2014-07-29 MED ORDER — POTASSIUM CHLORIDE CRYS ER 10 MEQ PO TBCR: 10.0000 meq | EXTENDED_RELEASE_TABLET | Freq: Two times a day (BID) | ORAL | Status: DC | PRN

## 2014-07-29 MED ORDER — TORSEMIDE 20 MG PO TABS: ORAL_TABLET | ORAL | Status: DC

## 2014-07-29 NOTE — Telephone Encounter (Signed)
Make sure also taking probiotic regularly With noted weight gain let's continue torsemide 20mg  bid for now, continue potassium as well - take one tablet for every torsemide she takes. If weight loss noted, may return to torsemide 20mg  once daily with extra prn weight gain >3 lbs. May come in for labs this week, but may schedule office visit if pt desires.

## 2014-07-29 NOTE — Telephone Encounter (Signed)
Advised patient of just 2 lb weight gain since last visit. She denied leg swelling but said she gets SOB when walking uphill. She said she had been taking torsemide BID since her last visit because that's what you told her to do. I advised her that your instructions advised to only take it once a day PRN for weight gain > 3 lbs. She then advised to disregard the call. I told her I would call her back since there had been confusion with her meds. She said she missed K+ last week because she forgot to take it, but has normally been regular with taking it along with the torsemide. Does she needs labs or just go back to PRN torsemide?

## 2014-07-29 NOTE — Telephone Encounter (Signed)
Patient notified. She is taking probiotic daily. She was instructed on torsemide and K+. She verbalized understanding. She will come in next week if not feeling better after restarting the K+.

## 2014-07-29 NOTE — Telephone Encounter (Signed)
Last visit she weighed 164lbs.  Any leg swelling or dyspnea? If so I would recommend start torsemide 1 pill daily regularly. Would also take potassium with torsemide. Would try this and if persistent weight gain would recommend office visit.

## 2014-07-30 ENCOUNTER — Other Ambulatory Visit: Payer: Self-pay | Admitting: Family Medicine

## 2014-08-03 ENCOUNTER — Ambulatory Visit (INDEPENDENT_AMBULATORY_CARE_PROVIDER_SITE_OTHER): Payer: Medicare Other | Admitting: *Deleted

## 2014-08-03 DIAGNOSIS — M9901 Segmental and somatic dysfunction of cervical region: Secondary | ICD-10-CM | POA: Diagnosis not present

## 2014-08-03 DIAGNOSIS — M5135 Other intervertebral disc degeneration, thoracolumbar region: Secondary | ICD-10-CM | POA: Diagnosis not present

## 2014-08-03 DIAGNOSIS — M9903 Segmental and somatic dysfunction of lumbar region: Secondary | ICD-10-CM | POA: Diagnosis not present

## 2014-08-03 DIAGNOSIS — I429 Cardiomyopathy, unspecified: Secondary | ICD-10-CM | POA: Diagnosis not present

## 2014-08-03 DIAGNOSIS — M9902 Segmental and somatic dysfunction of thoracic region: Secondary | ICD-10-CM | POA: Diagnosis not present

## 2014-08-03 DIAGNOSIS — I5032 Chronic diastolic (congestive) heart failure: Secondary | ICD-10-CM | POA: Diagnosis not present

## 2014-08-03 DIAGNOSIS — M5136 Other intervertebral disc degeneration, lumbar region: Secondary | ICD-10-CM | POA: Diagnosis not present

## 2014-08-03 DIAGNOSIS — M9907 Segmental and somatic dysfunction of upper extremity: Secondary | ICD-10-CM | POA: Diagnosis not present

## 2014-08-03 DIAGNOSIS — I420 Dilated cardiomyopathy: Secondary | ICD-10-CM

## 2014-08-03 DIAGNOSIS — M62462 Contracture of muscle, left lower leg: Secondary | ICD-10-CM | POA: Diagnosis not present

## 2014-08-03 DIAGNOSIS — M9906 Segmental and somatic dysfunction of lower extremity: Secondary | ICD-10-CM | POA: Diagnosis not present

## 2014-08-03 NOTE — Progress Notes (Signed)
Remote ICD transmission.   

## 2014-08-06 DIAGNOSIS — M5135 Other intervertebral disc degeneration, thoracolumbar region: Secondary | ICD-10-CM | POA: Diagnosis not present

## 2014-08-06 DIAGNOSIS — M9902 Segmental and somatic dysfunction of thoracic region: Secondary | ICD-10-CM | POA: Diagnosis not present

## 2014-08-06 DIAGNOSIS — M9901 Segmental and somatic dysfunction of cervical region: Secondary | ICD-10-CM | POA: Diagnosis not present

## 2014-08-06 DIAGNOSIS — M9906 Segmental and somatic dysfunction of lower extremity: Secondary | ICD-10-CM | POA: Diagnosis not present

## 2014-08-06 DIAGNOSIS — M9903 Segmental and somatic dysfunction of lumbar region: Secondary | ICD-10-CM | POA: Diagnosis not present

## 2014-08-06 DIAGNOSIS — M9907 Segmental and somatic dysfunction of upper extremity: Secondary | ICD-10-CM | POA: Diagnosis not present

## 2014-08-06 DIAGNOSIS — M62462 Contracture of muscle, left lower leg: Secondary | ICD-10-CM | POA: Diagnosis not present

## 2014-08-06 DIAGNOSIS — M5136 Other intervertebral disc degeneration, lumbar region: Secondary | ICD-10-CM | POA: Diagnosis not present

## 2014-08-06 LAB — MDC_IDC_ENUM_SESS_TYPE_REMOTE
Battery Voltage: 3.1 V
Brady Statistic RV Percent Paced: 0.01 %
Date Time Interrogation Session: 20160321104458
HighPow Impedance: 73 Ohm
Lead Channel Impedance Value: 494 Ohm
Lead Channel Pacing Threshold Amplitude: 0.5 V
Lead Channel Pacing Threshold Pulse Width: 0.4 ms
Lead Channel Sensing Intrinsic Amplitude: 6.5 mV
Lead Channel Sensing Intrinsic Amplitude: 6.5 mV
Lead Channel Setting Pacing Amplitude: 2.5 V
Lead Channel Setting Pacing Pulse Width: 0.4 ms
Lead Channel Setting Sensing Sensitivity: 0.3 mV
Zone Setting Detection Interval: 300 ms
Zone Setting Detection Interval: 360 ms
Zone Setting Detection Interval: 450 ms

## 2014-08-17 DIAGNOSIS — M62462 Contracture of muscle, left lower leg: Secondary | ICD-10-CM | POA: Diagnosis not present

## 2014-08-17 DIAGNOSIS — M5136 Other intervertebral disc degeneration, lumbar region: Secondary | ICD-10-CM | POA: Diagnosis not present

## 2014-08-17 DIAGNOSIS — M5135 Other intervertebral disc degeneration, thoracolumbar region: Secondary | ICD-10-CM | POA: Diagnosis not present

## 2014-08-17 DIAGNOSIS — M9902 Segmental and somatic dysfunction of thoracic region: Secondary | ICD-10-CM | POA: Diagnosis not present

## 2014-08-17 DIAGNOSIS — M9907 Segmental and somatic dysfunction of upper extremity: Secondary | ICD-10-CM | POA: Diagnosis not present

## 2014-08-17 DIAGNOSIS — M9903 Segmental and somatic dysfunction of lumbar region: Secondary | ICD-10-CM | POA: Diagnosis not present

## 2014-08-17 DIAGNOSIS — M9901 Segmental and somatic dysfunction of cervical region: Secondary | ICD-10-CM | POA: Diagnosis not present

## 2014-08-17 DIAGNOSIS — M9906 Segmental and somatic dysfunction of lower extremity: Secondary | ICD-10-CM | POA: Diagnosis not present

## 2014-08-19 ENCOUNTER — Encounter: Payer: Self-pay | Admitting: Cardiology

## 2014-08-24 DIAGNOSIS — M62462 Contracture of muscle, left lower leg: Secondary | ICD-10-CM | POA: Diagnosis not present

## 2014-08-24 DIAGNOSIS — M9901 Segmental and somatic dysfunction of cervical region: Secondary | ICD-10-CM | POA: Diagnosis not present

## 2014-08-24 DIAGNOSIS — M9907 Segmental and somatic dysfunction of upper extremity: Secondary | ICD-10-CM | POA: Diagnosis not present

## 2014-08-24 DIAGNOSIS — M5135 Other intervertebral disc degeneration, thoracolumbar region: Secondary | ICD-10-CM | POA: Diagnosis not present

## 2014-08-24 DIAGNOSIS — M9903 Segmental and somatic dysfunction of lumbar region: Secondary | ICD-10-CM | POA: Diagnosis not present

## 2014-08-24 DIAGNOSIS — M5136 Other intervertebral disc degeneration, lumbar region: Secondary | ICD-10-CM | POA: Diagnosis not present

## 2014-08-24 DIAGNOSIS — M9902 Segmental and somatic dysfunction of thoracic region: Secondary | ICD-10-CM | POA: Diagnosis not present

## 2014-08-24 DIAGNOSIS — M9906 Segmental and somatic dysfunction of lower extremity: Secondary | ICD-10-CM | POA: Diagnosis not present

## 2014-08-25 ENCOUNTER — Encounter: Payer: Self-pay | Admitting: Internal Medicine

## 2014-08-28 ENCOUNTER — Other Ambulatory Visit (INDEPENDENT_AMBULATORY_CARE_PROVIDER_SITE_OTHER): Payer: Medicare Other

## 2014-08-28 ENCOUNTER — Encounter: Payer: Self-pay | Admitting: Internal Medicine

## 2014-08-28 ENCOUNTER — Ambulatory Visit (INDEPENDENT_AMBULATORY_CARE_PROVIDER_SITE_OTHER): Payer: Medicare Other | Admitting: Internal Medicine

## 2014-08-28 VITALS — BP 106/62 | HR 84 | Ht 64.5 in | Wt 170.2 lb

## 2014-08-28 DIAGNOSIS — K5900 Constipation, unspecified: Secondary | ICD-10-CM | POA: Diagnosis not present

## 2014-08-28 DIAGNOSIS — R197 Diarrhea, unspecified: Secondary | ICD-10-CM

## 2014-08-28 DIAGNOSIS — R109 Unspecified abdominal pain: Secondary | ICD-10-CM

## 2014-08-28 DIAGNOSIS — K439 Ventral hernia without obstruction or gangrene: Secondary | ICD-10-CM

## 2014-08-28 LAB — IBC PANEL
Iron: 50 ug/dL (ref 42–145)
Saturation Ratios: 8.7 % — ABNORMAL LOW (ref 20.0–50.0)
Transferrin: 409 mg/dL — ABNORMAL HIGH (ref 212.0–360.0)

## 2014-08-28 LAB — COMPREHENSIVE METABOLIC PANEL
ALT: 28 U/L (ref 0–35)
AST: 20 U/L (ref 0–37)
Albumin: 4.4 g/dL (ref 3.5–5.2)
Alkaline Phosphatase: 135 U/L — ABNORMAL HIGH (ref 39–117)
BUN: 29 mg/dL — ABNORMAL HIGH (ref 6–23)
CO2: 27 mEq/L (ref 19–32)
Calcium: 10 mg/dL (ref 8.4–10.5)
Chloride: 102 mEq/L (ref 96–112)
Creatinine, Ser: 1.21 mg/dL — ABNORMAL HIGH (ref 0.40–1.20)
GFR: 46.88 mL/min — ABNORMAL LOW (ref >60.00)
Glucose, Bld: 95 mg/dL (ref 70–99)
Potassium: 4.3 mEq/L (ref 3.5–5.1)
Sodium: 137 mEq/L (ref 135–145)
Total Bilirubin: 0.3 mg/dL (ref 0.2–1.2)
Total Protein: 8.2 g/dL (ref 6.0–8.3)

## 2014-08-28 LAB — SEDIMENTATION RATE: Sed Rate: 74 mm/hr — ABNORMAL HIGH (ref 0–22)

## 2014-08-28 LAB — FOLATE: Folate: >23.3 ng/mL (ref >5.9)

## 2014-08-28 LAB — CBC WITH DIFFERENTIAL/PLATELET
Basophils Absolute: 0 10*3/uL (ref 0.0–0.1)
Basophils Relative: 0.2 % (ref 0.0–3.0)
Eosinophils Absolute: 0.1 10*3/uL (ref 0.0–0.7)
Eosinophils Relative: 1.1 % (ref 0.0–5.0)
HCT: 33.4 % — ABNORMAL LOW (ref 36.0–46.0)
Hemoglobin: 11.1 g/dL — ABNORMAL LOW (ref 12.0–15.0)
Lymphocytes Relative: 21.8 % (ref 12.0–46.0)
Lymphs Abs: 2.4 10*3/uL (ref 0.7–4.0)
MCHC: 33.3 g/dL (ref 30.0–36.0)
MCV: 85.3 fl (ref 78.0–100.0)
Monocytes Absolute: 1 10*3/uL (ref 0.1–1.0)
Monocytes Relative: 9.2 % (ref 3.0–12.0)
Neutro Abs: 7.4 10*3/uL (ref 1.4–7.7)
Neutrophils Relative %: 67.7 % (ref 43.0–77.0)
Platelets: 282 10*3/uL (ref 150.0–400.0)
RBC: 3.92 Mil/uL (ref 3.87–5.11)
RDW: 14 % (ref 11.5–15.5)
WBC: 10.9 10*3/uL — ABNORMAL HIGH (ref 4.0–10.5)

## 2014-08-28 LAB — FERRITIN: Ferritin: 4.5 ng/mL — ABNORMAL LOW (ref 10.0–291.0)

## 2014-08-28 NOTE — Progress Notes (Signed)
Ashley [name] 13-Apr-1946 [id number]  Note: This dictation was prepared with Dragon digital system. Any transcriptional errors that result from this procedure are unintentional.   History of Present Illness: This is a 69 year old white female with perforated diverticulitis resulting  in diverting colostomy in May 2015.in  [place] and takedown of colostomy in September 2015. She has had difficult recovery having alternating diarrhea, constipation and abdominal pain. She has heart disease and defibrillator ( Dr Bary Leriche).. Last full colonoscopy was in August 2005 which showed severe diverticulosis. She is due for recall colonoscopy but has been feeling extremely weak distended and nauseated. Her last hemoglobin in January 2016 was 9.8 hematocrit 36.4 and MCV of 89. She denies fever or rectal bleeding. She has been tolerating regular diet she still takes MiraLAX     Past Medical History  Diagnosis Date  . Migraine without aura, without mention of intractable migraine without mention of status migrainosus   . History of diverticulitis of colon   . Pure hypercholesterolemia   . Irritable bowel syndrome   . Laryngeal nodule     Dr. Thomasena Edis  . Fibrocystic breast disease   . Hemorrhoids   . Anxiety   . GERD (gastroesophageal reflux disease)   . Systolic CHF     EF 74-08%, LV dilation, significant MR, goal weight 150lbs  . Nonischemic cardiomyopathy   . History of pneumonia 2012  . Mitral regurgitation   . Anxiety   . Bronchiectasis     possibly mild, treat URIs aggressively  . Hypertension   . Heart murmur   . Angina   . Lung disease 07/14/11  . Shortness of breath     "resting"  . Anemia   . Fibromyalgia   . Depression   . Disorder of vocal cords   . Chronic sinusitis   . Insomnia   . Osteoporosis 01/2013  . sigmoid diverticulitis with perforation, abscess and fistula 09/30/2013  . H/O multiple allergies 10/10/2013  . PONV (postoperative nausea and vomiting)   . CHF (congestive  heart failure) 2011  . Pneumonia 2011  . Pacemaker   . Automatic implantable cardioverter-defibrillator in situ 2012  . Osteoarthritis     knees, back, hands, low back,   . Cancer     basal cell, arms & neck  . Sleep apnea 2010    borderline, inconclusive- /w    . Hemorrhoids   . Diarrhea 07/14/2008    Qualifier: Diagnosis of  By: Glori Bickers MD, Carmell Austria     Past Surgical History  Procedure Laterality Date  . Appendectomy    . Mandible surgery      "got about 6 pins in the bottom of my jaw"  . Knee arthroscopy w/ orif      Left; "meniscus tear"  . Osteotomy      Left foot  . 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 Dexa  03/28/99    osteopenia L/S  . Colonoscopy  1998    nml (sigmoid-Gilbert-nol)  . Head mri, head ct  10/1998    ? H/A focus ([facility])  . Emg/mcv  10/16/01    + mild carpal tunnel  . Dexa  03/12/03    2.2 Spine 0.1 Hip  Osteopenia  . Mri right hip  12/12/01    Tendonitis Gluteus Medius to Gtr Trochanter Neg Bursitis (Dr. Eulas Post)  . L/s films  11/21/01    nml; Right hip nml  . Mri u/s  11/21/01  min. DDD L/3-4  . Colonoscopy  01/04/04    polyps, diverticulosis/severe  . Dexa  11/14/05    1.9 Spine 0.3 Hip  slight improvement  . Flex laryngoscopy  06/11/06    ([facility]) nml  . Dexa  11/22/06    improved osteo and fem neck  . [facility] bunionectomy  11/04/08    Right Great Toe (Dr. Beola Cord)  . Breast mass excision  01/2010    Left-fibrocystic change w/intraductal papilloma, no malignancy  . Ct chest  12/2010    possible bronchiectasis lower lobes, 02/2011 - enlarged bilateral effusions, bibasilar atx  . Stress myoview  02/2011    no significant ischemia  . Ct chest  04/2011    no PE.  abnormal R hilar and mediastinal adenopathy --> on rpt 05/2011, resolved  . US echocardiography  04/2011    mildly dilated LV, EF 25-30%, hypokinesis, restrictive physiology, mod MR, no AS  . Icd placement  07/14/11    w/pacemaker  . Tonsillectomy and adenoidectomy    .  Breast biopsy      "I've had 2-3 bx; all benign"  . Augmentation mammaplasty    . 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 Abdominal hysterectomy  1987    w/BSO  . Lasik      left eye  . Knee surgery      left  . Mandible surgery    . Sinus surgery      x3 with balloon   . Dexa  01/2013    T score -2.6 at spine  . Partial colectomy N/A 10/02/2013    Procedure: PARTIAL COLECTOMY;  Surgeon: Imogene Burn. Georgette Dover, MD;  Location: [place];  Service: General;  Laterality: N/A;  . Colostomy N/A 10/02/2013    Procedure: DESCENDING COLOSTOMY;  Surgeon: Imogene Burn. Georgette Dover, MD;  Location: [place];  Service: General;  Laterality: N/A;  . Colostomy reversal  02/10/2014    dr Georgette Dover  . Colostomy reversal N/A 02/10/2014    Procedure: COLOSTOMY REVERSAL;  Surgeon: [name], MD;  Location: [place];  Service: General;  Laterality: N/A;  . Implantable cardioverter defibrillator implant N/A 07/14/2011    Procedure: IMPLANTABLE CARDIOVERTER DEFIBRILLATOR IMPLANT;  Surgeon: Evans Lance, MD;  Location: [facility];  Service: Cardiovascular;  Laterality: N/A;    Allergies  Allergen Reactions  . Antihistamines, Chlorpheniramine-Type Other (See Comments)    Makes her eyes look like she sees strobe lights  . Codeine Other (See Comments)    Confusion and dizziness  . Cyclobenzaprine Other (See Comments)    Adverse reaction - back and throat pain, dizziness, exhaustion  . Cymbalta [Duloxetine Hcl] Other (See Comments)    "body spasms and made me feel weird in my head"   . Influenza Vac Split [Flu Virus Vaccine] Other (See Comments)    "allergic to concentrated eggs that are in vaccine"  . Penicillins Other (See Comments)    REACTION: "told 50 years ago I couldn't take it; might be immune to it", able to take amoxicillin  . Sulfasalazine Other (See Comments)    "makes my whole body smell like sulfa & makes me sick just smelling it"  . Atorvastatin Other (See Comments)     Severe muscle cramps to generic atorvastatin.  Able to take brand lipitor.  . Chlorhexidine Other (See Comments)    blisters  . Dilaudid [Hydromorphone Hcl] Other (See Comments)    Feels like  she is burning internally   . Latex Nausea Only, Rash and Other (See Comments)    Pain and infection  . Morphine And Related Other (See Comments)    Internally feels like she is on fire   . Pravastatin Other (See Comments)    myalgias  . Prevacid [Lansoprazole] Other (See Comments)    Worsened GI side effects  . Red Dye Nausea Only and Swelling  . Rosuvastatin Other (See Comments)    Was not effective controlling lipids  . Simvastatin Other (See Comments)    Leg cramps  . Other     NOTE: pt is able to take cephalosporins without reaction  . Metaxalone Other (See Comments)    REACTION: unknown  . Tape Other (See Comments)    All adhesives     Family history and social history have been reviewed.  Review of Systems: Denies fever. Positive for diarrhea and constipation  The remainder of the 10 point ROS is negative except as outlined in the H&P  Physical Exam: General Appearance Well developed, in no distress Eyes  Non icteric  HEENT  Non traumatic, normocephalic  Mouth No lesion, tongue papillated, no cheilosis Neck Supple without adenopathy, thyroid not enlarged, no carotid bruits, no JVD Lungs Clear to auscultation bilaterally COR Normal S1, normal S2, regular rhythm, no murmur, quiet precordium Abdomen mildly protuberant. Diffusely tender normoactive bowel sounds. He contracted scar below the umbilicus from healing by secondary closure. There is a ventral as well as umbilical hernia which is easily reducible Rectal not done Extremities  No pedal edema Skin No lesions Neurological Alert and oriented x 3 Psychological Normal mood and affect  Assessment and Plan:   69 year old white female with slow recovery since reversal of colostomy for perforated diverticulitis. I'm not clear  whether be dealing with an irritable bowel syndrome all with actual anatomic problems such as the anastomotic stricture. Some of her abdominal discomfort is due to abdominal wall pain and ventral and umbilical hernia. We will proceed with CT scan of the abdomen and pelvis with IV and oral contrast. We will be  checking  sedimentation rate, iron panel, CBC and metabolic panel. She is due for colonoscopy but at this point I would like to wait little longer because of her generalized abdominal tenderness which would make the prep rather difficult    Delfin Edis 08/28/2014

## 2014-08-28 NOTE — Patient Instructions (Addendum)
You have been scheduled for a CT scan of the abdomen and pelvis at [facility] ([address]---this is in the same building as [facility]).   You are scheduled on 09/01/14 at 11 am. You should arrive 15 minutes prior to your appointment time for registration. Please follow the written instructions below on the day of your exam:  WARNING: IF YOU ARE ALLERGIC TO IODINE/X-RAY DYE, PLEASE NOTIFY RADIOLOGY IMMEDIATELY AT [phone]! YOU WILL BE GIVEN A 13 HOUR PREMEDICATION PREP.  1) Do not eat or drink anything after 7 am (4 hours prior to your test) 2) You have been given 2 bottles of oral contrast to drink. The solution may taste  better if refrigerated, but do NOT add ice or any other liquid to this solution. Shake  well before drinking.    Drink 1 bottle of contrast @ 9 am (2 hours prior to your exam)  Drink 1 bottle of contrast @ 10 am (1 hour prior to your exam)  You may take any medications as prescribed with a small amount of water except for the following: Metformin, Glucophage, Glucovance, Avandamet, Riomet, Fortamet, Actoplus Met, Janumet, Glumetza or Metaglip. The above medications must be held the day of the exam AND 48 hours after the exam.  The purpose of you drinking the oral contrast is to aid in the visualization of your intestinal tract. The contrast solution may cause some diarrhea. Before your exam is started, you will be given a small amount of fluid to drink. Depending on your individual set of symptoms, you may also receive an intravenous injection of x-ray contrast/dye. Plan on being at [facility] for 30 minutes or long, depending on the type of exam you are having performed.  This test typically takes 30-45 minutes to complete.  If you have any questions regarding your exam or if you need to reschedule, you may call the CT department at [phone] between the hours of 8:00 am and 5:00 pm,  Monday-Friday.  ________________________________________________________________________  Your physician has requested that you go to the basement for lab work before leaving today.  Dr Danise Mina

## 2014-08-31 ENCOUNTER — Telehealth: Payer: Self-pay | Admitting: *Deleted

## 2014-08-31 ENCOUNTER — Telehealth: Payer: Self-pay

## 2014-08-31 DIAGNOSIS — M9903 Segmental and somatic dysfunction of lumbar region: Secondary | ICD-10-CM | POA: Diagnosis not present

## 2014-08-31 DIAGNOSIS — M9902 Segmental and somatic dysfunction of thoracic region: Secondary | ICD-10-CM | POA: Diagnosis not present

## 2014-08-31 DIAGNOSIS — M5135 Other intervertebral disc degeneration, thoracolumbar region: Secondary | ICD-10-CM | POA: Diagnosis not present

## 2014-08-31 DIAGNOSIS — M62462 Contracture of muscle, left lower leg: Secondary | ICD-10-CM | POA: Diagnosis not present

## 2014-08-31 DIAGNOSIS — M9907 Segmental and somatic dysfunction of upper extremity: Secondary | ICD-10-CM | POA: Diagnosis not present

## 2014-08-31 DIAGNOSIS — M9901 Segmental and somatic dysfunction of cervical region: Secondary | ICD-10-CM | POA: Diagnosis not present

## 2014-08-31 DIAGNOSIS — M9906 Segmental and somatic dysfunction of lower extremity: Secondary | ICD-10-CM | POA: Diagnosis not present

## 2014-08-31 DIAGNOSIS — M5136 Other intervertebral disc degeneration, lumbar region: Secondary | ICD-10-CM | POA: Diagnosis not present

## 2014-08-31 NOTE — Telephone Encounter (Signed)
Notes Recorded by Lafayette Dragon, MD on 08/28/2014 at 10:25 PM PLEASE CANCEL IV CONTRAST for CT scan, use only oral contrast . Renal function impaired.Also, she has, iron deficiency anemia and elevated sed rate and will need further evaluation after CT scan.reviewed Spoke with Ronda CT and cancelled IV contrast.(Lisa)Left a message for patient to call back for results.

## 2014-08-31 NOTE — Telephone Encounter (Signed)
Pt left v/m; pt was seen at [facility] in [place] and had lab testing done that indicated pt is anemic and kidneys are in jeopardy; pt is to have CT on 09/01/14. Pt calling as FYI and wants Dr Darnell Level to be aware what is going on and if meds are given by GI that Dr Darnell Level approves of meds given.

## 2014-08-31 NOTE — Telephone Encounter (Signed)
Patient notified of results and recommendations.

## 2014-09-01 ENCOUNTER — Ambulatory Visit (INDEPENDENT_AMBULATORY_CARE_PROVIDER_SITE_OTHER)
Admission: RE | Admit: 2014-09-01 | Discharge: 2014-09-01 | Disposition: A | Discharge status: Home / Self Care | Payer: Medicare Other | Source: Ambulatory Visit | Attending: Internal Medicine | Admitting: Internal Medicine

## 2014-09-01 DIAGNOSIS — K5900 Constipation, unspecified: Secondary | ICD-10-CM

## 2014-09-01 DIAGNOSIS — R197 Diarrhea, unspecified: Secondary | ICD-10-CM

## 2014-09-01 DIAGNOSIS — R109 Unspecified abdominal pain: Secondary | ICD-10-CM

## 2014-09-01 DIAGNOSIS — K439 Ventral hernia without obstruction or gangrene: Secondary | ICD-10-CM | POA: Diagnosis not present

## 2014-09-02 ENCOUNTER — Encounter: Payer: Self-pay | Admitting: Cardiology

## 2014-09-04 ENCOUNTER — Ambulatory Visit (INDEPENDENT_AMBULATORY_CARE_PROVIDER_SITE_OTHER): Payer: Medicare Other | Admitting: Cardiovascular Disease

## 2014-09-04 ENCOUNTER — Encounter: Payer: Self-pay | Admitting: Cardiovascular Disease

## 2014-09-04 ENCOUNTER — Encounter: Payer: Self-pay | Admitting: Internal Medicine

## 2014-09-04 VITALS — BP 100/60 | HR 76 | Ht 65.5 in | Wt 169.5 lb

## 2014-09-04 DIAGNOSIS — M797 Fibromyalgia: Secondary | ICD-10-CM | POA: Diagnosis not present

## 2014-09-04 DIAGNOSIS — I5022 Chronic systolic (congestive) heart failure: Secondary | ICD-10-CM

## 2014-09-04 DIAGNOSIS — N183 Chronic kidney disease, stage 3 unspecified: Secondary | ICD-10-CM

## 2014-09-04 DIAGNOSIS — E7800 Pure hypercholesterolemia, unspecified: Secondary | ICD-10-CM

## 2014-09-04 DIAGNOSIS — R079 Chest pain, unspecified: Secondary | ICD-10-CM

## 2014-09-04 DIAGNOSIS — D500 Iron deficiency anemia secondary to blood loss (chronic): Secondary | ICD-10-CM

## 2014-09-04 MED ORDER — FERROUS FUMARATE 150 MG PO TABS: 150.0000 mg | ORAL_TABLET | Freq: Every day | ORAL | Status: DC

## 2014-09-04 NOTE — Patient Instructions (Addendum)
You are doing well.  Please hold the torsemide on the weekends, renal function is getting high Ok to take iron pill  Please call us if you have new issues that need to be addressed before your next appt.  Your physician wants you to follow-up in: 6 months.  You will receive a reminder letter in the mail two months in advance. If you don't receive a letter, please call our office to schedule the follow-up appointment.

## 2014-09-04 NOTE — Assessment & Plan Note (Signed)
Recent climb in the creatinine and BUN. This happened previously in 2015 at which time we cut back on her diuretics with improvement of her numbers. On today's visit we have recommended she stop diuretics on the weekend

## 2014-09-04 NOTE — Op Note (Signed)
PATIENT NAME:  Ashley [name], Ashley [name] MR#:  [id number] DATE OF BIRTH:  [birth year]  DATE OF PROCEDURE:  09/09/2012  PREOPERATIVE DIAGNOSES:  1.  Chronic bilateral maxillary sinusitis.  2.  Chronic bilateral frontal sinusitis.  3.  Chronic bilateral sphenoid sinusitis.   POSTOPERATIVE DIAGNOSES:  1.  Chronic bilateral frontal sinusitis.  2.  Chronic bilateral maxillary sinusitis.  3.  Chronic bilateral sphenoid sinusitis.   OPERATION: 1.  Bilateral endoscopic maxillary antrostomies.  2.  Bilateral endoscopic frontal sinusotomies.  3.  Bilateral endoscopic sphenoidotomies.  4.  Image-guided sinus surgery.    SURGEON: Huey Romans, MD   ANESTHESIA: General.   COMPLICATIONS: None.   TOTAL ESTIMATED BLOOD LOSS: 25 mL.   DESCRIPTION OF PROCEDURE: The patient was given general anesthesia by oral endotracheal intubation. She was placed in the supine position on the operating room table. The image-guided system was brought in, and the CT scan was downloaded from the disk to the system. The face was washed and a template was applied. The template was downloaded to the system.  There was 0.7 mm of variance. The suction pointers were downloaded to the system as well, and these were checked for alignment. There was good alignment at the tip of the nose and the internal structures.   The patient was prepped and draped in sterile fashion. The 0-degree endoscopes were used first for visualizing the nose. The nose was prepped using 4% Xylocaine mixed with phenylephrine on cotton pledgets in the nose. These were allowed to sit for 10 minutes, and the cotton pledgets were removed.  Local anesthesia using 1% Xylocaine with epinephrine 1:200,000 was used for infiltration at the anterior uncinate process where it meets the anterior border of the middle turbinate and also at the posterior border of the middle turbinate where it meets the lateral wall. This was done on both sides with 0.5 mm in each place. The  0-degree scope was used again, and the middle turbinate was in-fractured to give better visualization of the middle meatus. On the left side, there was a very long maxillary antrostomy, and this was elevated forward.  Acclarent balloon was used to slide the guidewire into the maxillary sinus, and the balloon was used over this to dilate the opening some. The backbiters were then used to trim the uncinate process and give better visualization of the area. The natural ostium was widened from the previous balloon, and the uncinate process was removed all around the maxillary antrum. The 30 and 70-degree scopes were used to visualize inside this.  Curved suction was used for removal of some of the clots and mucus here. There was no sign of any lesions inside the maxillary sinus. The rest of the uncinate process was removed going forward using the 45-degree up-biting forceps as well straight-biting forceps.  As the uncinate process got closer anteriorly, the frontal sinus tip on the Acclarent balloon catheter was used for visualizing this area. I could put this little guidewire up into the sinus. The sinus was small, and opening was lateralized a little bit. The rest of the uncinate process was removed using 45-degree biting forceps until I could see up into the frontal sinus duct and follow the tract up there. The middle meatus was then packed with a cottonoid pledget soaked in phenylephrine, and this was allowed to sit.   A 0-degree scope was then used on the right side to visualize the area here. The septum was relatively straight.  The middle turbinate was medialized  again to get into the middle meatus. Again the uncinate process was lifted out with the angled freer elevator that lifted it up. The Acclarent balloon sinuplasty was then used to thread the guidewire through into the maxillary sinus. This was evident in the sinus. The balloon catheter was passed over this and was dilated to  12 cm pressure. The  uncinate process was then pulled out some, and the backbiter was used for incising it inferiorly and lifting up the uncinate process over the maxillary antral window. The uncinate process was removed again with 45-degree biting forceps and carried this into the anterior ethmoid area. With the uncinate process removed from the maxillary antrum, the 30 and 70-degrees scopes were used to visualize this area. There were no lesions inside the maxillary sinus, and the maxillary antrum had been dilated. The 30-degree scopes were then used anteriorly for visualizing the anterior border of the uncinate process. This was trimmed some. The guidewire was used to find the opening to the frontal sinus, although it was very small. There was an agger nasi cell right  next to it, and this was removed to allow direct visualization up into the frontal sinus. The ethmoid sinuses were not opened up on either side.   The left middle turbinate was now out-fractured and followed along inferiorly. The  superior turbinate was then found along its medial inferior border. Some of this was trimmed to give visualization into the sphenoid sinus.  The pediatric straight forceps were used for trimming some of this, removing a little bit of the bone overlying the opening to start widening this.  The balloon catheter was then introduced. The neck of the Acclarent balloon catheter was then introduced, and the guidewire was placed into the sinus. The balloon was dilated over this to 8-French  and removed.  You could now see a good opening into the sphenoid sinus on the side.  The scope was placed into this. There was no further sign of mucus in the sphenoid sinus. The middle turbinate was medialized again, and a cotton pledget was placed back into the middle meatus.   The right middle turbinate was then out-fractured, and again the 0-degrees scope was used to visualize the inferior border of the superior turbinate. This was trimmed some to provide  better opening into the sphenoid sinus. A small hole was noted, and this was widened some, making it larger.  Then the guidewire was placed into this and the balloon catheter over that, and it was dilated to 8 cm pressure again to widen the opening further into the right sphenoid sinus. Once this was dilated it was pulled out, and I could see the opening into the sinus, and there was no further evidence of disease.   The middle turbinates were then in-fractured again on both sides just to make sure they stayed there.  Xerogel is normally used, but she is allergic to shellfish, so I used Stammberger Foam to fill up the entire middle meatus and frontal sinus duct opening. This covered over the maxillary sinus duct opening as well. This was placed on both sides.  The patient was then awakened and taken to the recovery room in satisfactory condition. There were no operative complications.   ____________________________ Huey Romans, MD phj:cb D: 09/09/2012 22:35:28 ET T: 09/09/2012 23:22:17 ET JOB#: [id number]  cc: Huey Romans, MD, <Dictator> Huey Romans MD ELECTRONICALLY SIGNED 09/19/2012 10:25

## 2014-09-04 NOTE — Progress Notes (Signed)
Patient ID: Ashley [name], female    DOB: November 06, 1945, 69 y.o.   MRN: [id number]  HPI Comments: 69 y/o female with history of fibromyalgia, irritable bowel, pneumonia, chronic cough felt secondary to GERD, abnormal PFTs in the past, shortness of breath dating back to September 2012 that presented acutely, CT scan showing progressive pleural effusion , echo showing Cardiomyopathy  ejection fraction estimated at 25-35%,  Previous  Lexiscan Myoview  showed no significant ischemia, no EKG changes concerning for ischemia.  EF now 50 to 55% in 02/2013 and 12/15  In followup today, she reports that she is feeling well. Her weight has trended upwards over the past 6 months. Recent lab work showing mildly elevated creatinine and BUN likely from mild dehydration. She denies any lower extremity edema. She has abdominal bloating from her previous abdominal surgery. Eating more, no regular exercise, sedentary in general secondary to fibromyalgia and fatigue Weight up 12 pounds from August 201 [place] and then [place] in the near future  EKG on today's visit shows normal sinus rhythm with rate 76 bpm, no significant ST or T-wave changes  Other past medical history Previous admission to [facility] in December 2012 for bronchitis and hypoxia as well as systolic CHF. She had antibiotics, prednisone and diuresis and has felt better since discharge. ACE inhibitor placed on hold secondary to chronic cough.  S/P ICD 10 days ago for EF <35%.  Goal weight had been in the low 150 range. Previous Cardiac MRI showed ejection fraction 34 %    ICD placed  by Dr. Lovena Le.  Previous symptoms of malaise and fatigue, tachycardia, weight gain, leg swelling . previously taking high doses of diuretics for weight gain causing renal dysfunction, dehydration. For creatinine 1.9 we suggested she cut back on her diuretics. Prior weight was in the low 160 pound range  History of  diverticulosis, abscess formation, perforation requiring a long course of antibiotics with PICC line,  subsequent CT scan Sep 30 2013 showing free air, abscess requiring partial colectomy. She has an ostomy. Today she has lost 22 pounds by her report.   Previous echocardiogram shows improvement in her ejection fraction up to 50%. Challenging image quality. On my own review, it does appear at least 45, if not 50%.   Allergies  Allergen Reactions  . Antihistamines, Chlorpheniramine-Type Other (See Comments)    Makes her eyes look like she sees strobe lights  . Codeine Other (See Comments)    Confusion and dizziness  . Cyclobenzaprine Other (See Comments)    Adverse reaction - back and throat pain, dizziness, exhaustion  . Cymbalta [Duloxetine Hcl] Other (See Comments)    "body spasms and made me feel weird in my head"   . Influenza Vac Split [Flu Virus Vaccine] Other (See Comments)    "allergic to concentrated eggs that are in vaccine"  . Penicillins Other (See Comments)    REACTION: "told 50 years ago I couldn't take it; might be immune to it", able to take amoxicillin  . Sulfasalazine Other (See Comments)    "makes my whole body smell like sulfa & makes me sick just smelling it"  . Atorvastatin Other (See Comments)    Severe muscle cramps to generic atorvastatin.  Able to take brand lipitor.  . Chlorhexidine Other (See Comments)    blisters  . Dilaudid [Hydromorphone Hcl] Other (See Comments)    Feels like she is burning internally   . Latex Nausea Only,  Rash and Other (See Comments)    Pain and infection  . Morphine And Related Other (See Comments)    Internally feels like she is on fire   . Pravastatin Other (See Comments)    myalgias  . Prevacid [Lansoprazole] Other (See Comments)    Worsened GI side effects  . Red Dye Nausea Only and Swelling  . Rosuvastatin Other (See Comments)    Was not effective controlling lipids  . Simvastatin Other (See Comments)    Leg cramps  .  Other     NOTE: pt is able to take cephalosporins without reaction  . Metaxalone Other (See Comments)    REACTION: unknown  . Tape Other (See Comments)    All adhesives     Outpatient Encounter Prescriptions as of 09/04/2014  Medication Sig  . acetaminophen (TYLENOL) 325 MG tablet Take 325 mg by mouth 2 (two) times daily as needed for moderate pain.  Marland Kitchen alendronate (FOSAMAX) 70 MG tablet Take 70 mg by mouth every Thursday. Take with a full glass of water on an empty stomach.  . ALPRAZolam (XANAX) 0.25 MG tablet Take 1 tablet (0.25 mg total) by mouth 2 (two) times daily as needed for anxiety.  . Ascorbic Acid (VITAMIN C) 1000 MG tablet Take 1,000 mg by mouth daily.  Marland Kitchen aspirin EC 81 MG tablet Take 81 mg by mouth at bedtime.   Marland Kitchen atorvastatin (LIPITOR) 40 MG tablet Take 1 tablet (40 mg total) by mouth every other day.  Marland Kitchen BIOTIN 5000 PO Take 5,000 mcg by mouth daily.  . Calcium Carbonate-Vitamin D (CALCIUM + D PO) Take 1 tablet by mouth daily.  . carvedilol (COREG) 12.5 MG tablet Take 1 tablet (12.5 mg total) by mouth 2 (two) times daily with a meal.  . Coenzyme Q10 (CO Q 10 PO) Take 1 tablet by mouth daily.  Marland Kitchen dextromethorphan (DELSYM) 30 MG/5ML liquid Take 30 mg by mouth as needed for cough.  Marland Kitchen KLOR-CON 10 10 MEQ tablet TAKE 1 TABLET (10 MEQ TOTAL) BY MOUTH DAILY AS NEEDED (WHEN TORSEMIDE TAKEN).  Marland Kitchen omeprazole (PRILOSEC) 40 MG capsule Take 1 capsule (40 mg total) by mouth 2 (two) times daily before a meal.  . ondansetron (ZOFRAN ODT) 4 MG disintegrating tablet Take 1 tablet (4 mg total) by mouth every 8 (eight) hours as needed for nausea or vomiting.  . polyethylene glycol (MIRALAX / GLYCOLAX) packet Take 9 g by mouth daily.  Marland Kitchen PRESCRIPTION MEDICATION Place 1 application into both ears daily as needed (MED NAME: mometasone 0.1% SOLUTION (for eczema on ears)).  . Probiotic Product (PROBIOTIC DAILY) CAPS Take 1 capsule by mouth daily.  Marland Kitchen spironolactone (ALDACTONE) 25 MG tablet Take 25 mg by  mouth every morning.  . torsemide (DEMADEX) 20 MG tablet Daily with extra dose if weight gain >3lbs noted  . triamcinolone (NASACORT) 55 MCG/ACT AERO nasal inhaler Place 2 sprays into the nose 2 (two) times daily.  . vitamin E 100 UNIT capsule Take 100 Units by mouth daily.  . Wheat Dextrin (BENEFIBER DRINK MIX PO) Take 1 application by mouth daily. Pt takes [facility] Natural Fiber  . Ferrous Fumarate 150 MG TABS Take 1 tablet (150 mg total) by mouth daily.    Past Medical History  Diagnosis Date  . Migraine without aura, without mention of intractable migraine without mention of status migrainosus   . History of diverticulitis of colon   . Pure hypercholesterolemia   . Irritable bowel syndrome   . Laryngeal nodule  Dr. Thomasena Edis  . Fibrocystic breast disease   . Hemorrhoids   . Anxiety   . GERD (gastroesophageal reflux disease)   . Systolic CHF     EF 44-81%, LV dilation, significant MR, goal weight 150lbs  . Nonischemic cardiomyopathy   . History of pneumonia 2012  . Mitral regurgitation   . Anxiety   . Bronchiectasis     possibly mild, treat URIs aggressively  . Hypertension   . Heart murmur   . Angina   . Lung disease 07/14/11  . Shortness of breath     "resting"  . Anemia   . Fibromyalgia   . Depression   . Disorder of vocal cords   . Chronic sinusitis   . Insomnia   . Osteoporosis 01/2013  . sigmoid diverticulitis with perforation, abscess and fistula 09/30/2013  . H/O multiple allergies 10/10/2013  . PONV (postoperative nausea and vomiting)   . CHF (congestive heart failure) 2011  . Pneumonia 2011  . Pacemaker   . Automatic implantable cardioverter-defibrillator in situ 2012  . Osteoarthritis     knees, back, hands, low back,   . Cancer     basal cell, arms & neck  . Sleep apnea 2010    borderline, inconclusive- /w [place]   . Hemorrhoids   . Diarrhea 07/14/2008    Qualifier: Diagnosis of  By: Glori Bickers MD, Carmell Austria     Past Surgical History  Procedure Laterality  Date  . Appendectomy    . Mandible surgery      "got about 6 pins in the bottom of my jaw"  . Knee arthroscopy w/ orif      Left; "meniscus tear"  . Osteotomy      Left foot  . 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 Dexa  03/28/99    osteopenia L/S  . Colonoscopy  1998    nml (sigmoid-Gilbert-nol)  . Head mri, head ct  10/1998    ? H/A focus ([place])  . Emg/mcv  10/16/01    + mild carpal tunnel  . Dexa  03/12/03    2.2 Spine 0.1 Hip  Osteopenia  . Mri right hip  12/12/01    Tendonitis Gluteus Medius to Gtr Trochanter Neg Bursitis (Dr. Eulas Post)  . L/s films  11/21/01    nml; Right hip nml  . Mri u/s  11/21/01    min. DDD L/3-4  . Colonoscopy  01/04/04    polyps, diverticulosis/severe  . Dexa  11/14/05    1.9 Spine 0.3 Hip  slight improvement  . Flex laryngoscopy  06/11/06    ([place]) nml  . Dexa  11/22/06    improved osteo and fem neck  . [place] bunionectomy  11/04/08    Right Great Toe (Dr. Beola Cord)  . Breast mass excision  01/2010    Left-fibrocystic change w/intraductal papilloma, no malignancy  . Ct chest  12/2010    possible bronchiectasis lower lobes, 02/2011 - enlarged bilateral effusions, bibasilar atx  . Stress myoview  02/2011    no significant ischemia  . Ct chest  04/2011    no PE.  abnormal R hilar and mediastinal adenopathy --> on rpt 05/2011, resolved  . US echocardiography  04/2011    mildly dilated LV, EF 25-30%, hypokinesis, restrictive physiology, mod MR, no AS  . Icd placement  07/14/11    w/pacemaker  . Tonsillectomy and adenoidectomy    . Breast biopsy      "I've had 2-3  bx; all benign"  . Augmentation mammaplasty    . 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 Abdominal hysterectomy  1987    w/BSO  . Lasik      left eye  . Knee surgery      left  . Mandible surgery    . Sinus surgery      x3 with balloon   . Dexa  01/2013    T score -2.6 at spine  . Partial colectomy N/A 10/02/2013    Procedure: PARTIAL  COLECTOMY;  Surgeon: Imogene Burn. Georgette Dover, MD;  Location: [place];  Service: General;  Laterality: N/A;  . Colostomy N/A 10/02/2013    Procedure: DESCENDING COLOSTOMY;  Surgeon: Imogene Burn. Georgette Dover, MD;  Location: [place];  Service: General;  Laterality: N/A;  . Colostomy reversal  02/10/2014    dr Georgette Dover  . Colostomy reversal N/A 02/10/2014    Procedure: COLOSTOMY REVERSAL;  Surgeon: [name], MD;  Location: [place];  Service: General;  Laterality: N/A;  . Implantable cardioverter defibrillator implant N/A 07/14/2011    Procedure: IMPLANTABLE CARDIOVERTER DEFIBRILLATOR IMPLANT;  Surgeon: Evans Lance, MD;  Location: [facility] C[facility] LAB;  Service: Cardiovascular;  Laterality: N/A;    Social History  reports that she has never smoked. She has never used smokeless tobacco. She reports that she does not drink alcohol or use illicit drugs.  Family History family history includes Alcohol abuse in her paternal grandfather; Arthritis in her mother; Colon polyps in her father; Dementia in her mother; Emphysema in her mother; Heart disease in her paternal grandfather and paternal grandmother; Hypertension in her maternal grandfather; Lung cancer in her father; Osteoporosis in her mother; Stroke in her mother and paternal grandfather. There is no history of Colon cancer, Esophageal cancer, or Gallbladder disease.       Review of Systems  Constitutional: Positive for fatigue.  Respiratory: Negative.   Cardiovascular: Negative.   Gastrointestinal: Positive for abdominal pain.  Musculoskeletal: Positive for myalgias, back pain, arthralgias and neck stiffness.  Skin: Negative.   Neurological: Negative.   Hematological: Negative.   Psychiatric/Behavioral: Negative.   All other systems reviewed and are negative.   BP 100/60 mmHg  Pulse 76  Ht 5' 5.5" (1.664 m)  Wt 169 lb 8 oz (76.885 kg)  BMI 27.77 kg/m2  Physical Exam  Constitutional: She is oriented to person, place, and time. She appears well-developed and  well-nourished.  HENT:  Head: Normocephalic.  Nose: Nose normal.  Mouth/Throat: Oropharynx is clear and moist.  Eyes: Conjunctivae are normal. Pupils are equal, round, and reactive to light.  Neck: Normal range of motion. Neck supple. No JVD present.  Cardiovascular: Normal rate, regular rhythm, S1 normal, S2 normal, normal heart sounds and intact distal pulses.  Exam reveals no gallop and no friction rub.   No murmur heard. Pulmonary/Chest: Effort normal and breath sounds normal. No respiratory distress. She has no wheezes. She has no rales. She exhibits no tenderness.  Abdominal: Soft. Bowel sounds are normal. She exhibits no distension. There is no tenderness.  Musculoskeletal: Normal range of motion. She exhibits no edema or tenderness.  Lymphadenopathy:    She has no cervical adenopathy.  Neurological: She is alert and oriented to person, place, and time. Coordination normal.  Skin: Skin is warm and dry. No rash noted. No erythema.  Psychiatric: She has a normal mood and affect. Her behavior is normal. Judgment and thought content  normal.    Assessment and Plan  Nursing note and vitals reviewed.

## 2014-09-04 NOTE — Assessment & Plan Note (Signed)
Chronic fatigue, recommended a regular walking program. She does not seem to be able to do this on a regular basis

## 2014-09-04 NOTE — Telephone Encounter (Signed)
Noted. plz notify pt i am following along with Dr Olevia Perches. She has iron deficiency anemia so we need to check upper and lower gi tracts - can discuss with GI re: options besides colonoscopy to eval lower GI tract Otherwise we can talk about this at her next visit

## 2014-09-04 NOTE — Assessment & Plan Note (Signed)
She is reluctant to have a colonoscopy She is requesting an iron supplement by prescription.

## 2014-09-04 NOTE — Assessment & Plan Note (Signed)
Cholesterol is at goal on the current lipid regimen. No changes to the medications were made.  

## 2014-09-04 NOTE — Assessment & Plan Note (Signed)
Recent renal function suggesting mild dehydration. Recommended she hold her diuretic on weekends Ejection fraction has been running in normal range

## 2014-09-04 NOTE — Telephone Encounter (Signed)
Scheduled for EGD on 10/09/14 and will swallow a camera for lower GI. Will be holding the torsemide on Saturdays and Sundays per Dr. [name] to help kidneys.

## 2014-09-07 DIAGNOSIS — M9907 Segmental and somatic dysfunction of upper extremity: Secondary | ICD-10-CM | POA: Diagnosis not present

## 2014-09-07 DIAGNOSIS — M5135 Other intervertebral disc degeneration, thoracolumbar region: Secondary | ICD-10-CM | POA: Diagnosis not present

## 2014-09-07 DIAGNOSIS — M5136 Other intervertebral disc degeneration, lumbar region: Secondary | ICD-10-CM | POA: Diagnosis not present

## 2014-09-07 DIAGNOSIS — M62462 Contracture of muscle, left lower leg: Secondary | ICD-10-CM | POA: Diagnosis not present

## 2014-09-07 DIAGNOSIS — M9903 Segmental and somatic dysfunction of lumbar region: Secondary | ICD-10-CM | POA: Diagnosis not present

## 2014-09-07 DIAGNOSIS — M9902 Segmental and somatic dysfunction of thoracic region: Secondary | ICD-10-CM | POA: Diagnosis not present

## 2014-09-07 DIAGNOSIS — M9906 Segmental and somatic dysfunction of lower extremity: Secondary | ICD-10-CM | POA: Diagnosis not present

## 2014-09-07 DIAGNOSIS — M9901 Segmental and somatic dysfunction of cervical region: Secondary | ICD-10-CM | POA: Diagnosis not present

## 2014-09-08 ENCOUNTER — Telehealth: Payer: Self-pay | Admitting: *Deleted

## 2014-09-08 NOTE — Telephone Encounter (Signed)
Received fax from pharmacy that iron supplement is not available and they will need a substitute.

## 2014-09-09 DIAGNOSIS — M722 Plantar fascial fibromatosis: Secondary | ICD-10-CM | POA: Diagnosis not present

## 2014-09-09 DIAGNOSIS — M7742 Metatarsalgia, left foot: Secondary | ICD-10-CM | POA: Diagnosis not present

## 2014-09-10 MED ORDER — FERROUS SULFATE DRIED ER 160 (50 FE) MG PO TBCR: 1.0000 | EXTENDED_RELEASE_TABLET | Freq: Every day | ORAL | Status: DC

## 2014-09-10 NOTE — Telephone Encounter (Signed)
Sent in sloFe alternative.

## 2014-09-13 HISTORY — PX: ESOPHAGOGASTRODUODENOSCOPY: SHX1529

## 2014-09-28 DIAGNOSIS — M9906 Segmental and somatic dysfunction of lower extremity: Secondary | ICD-10-CM | POA: Diagnosis not present

## 2014-09-28 DIAGNOSIS — M62462 Contracture of muscle, left lower leg: Secondary | ICD-10-CM | POA: Diagnosis not present

## 2014-09-28 DIAGNOSIS — M5136 Other intervertebral disc degeneration, lumbar region: Secondary | ICD-10-CM | POA: Diagnosis not present

## 2014-09-28 DIAGNOSIS — M9903 Segmental and somatic dysfunction of lumbar region: Secondary | ICD-10-CM | POA: Diagnosis not present

## 2014-09-28 DIAGNOSIS — M9902 Segmental and somatic dysfunction of thoracic region: Secondary | ICD-10-CM | POA: Diagnosis not present

## 2014-09-28 DIAGNOSIS — M9907 Segmental and somatic dysfunction of upper extremity: Secondary | ICD-10-CM | POA: Diagnosis not present

## 2014-09-28 DIAGNOSIS — M5135 Other intervertebral disc degeneration, thoracolumbar region: Secondary | ICD-10-CM | POA: Diagnosis not present

## 2014-09-28 DIAGNOSIS — M9901 Segmental and somatic dysfunction of cervical region: Secondary | ICD-10-CM | POA: Diagnosis not present

## 2014-09-30 DIAGNOSIS — M9902 Segmental and somatic dysfunction of thoracic region: Secondary | ICD-10-CM | POA: Diagnosis not present

## 2014-09-30 DIAGNOSIS — M62462 Contracture of muscle, left lower leg: Secondary | ICD-10-CM | POA: Diagnosis not present

## 2014-09-30 DIAGNOSIS — M9907 Segmental and somatic dysfunction of upper extremity: Secondary | ICD-10-CM | POA: Diagnosis not present

## 2014-09-30 DIAGNOSIS — M9906 Segmental and somatic dysfunction of lower extremity: Secondary | ICD-10-CM | POA: Diagnosis not present

## 2014-09-30 DIAGNOSIS — M9901 Segmental and somatic dysfunction of cervical region: Secondary | ICD-10-CM | POA: Diagnosis not present

## 2014-09-30 DIAGNOSIS — M5135 Other intervertebral disc degeneration, thoracolumbar region: Secondary | ICD-10-CM | POA: Diagnosis not present

## 2014-09-30 DIAGNOSIS — M9903 Segmental and somatic dysfunction of lumbar region: Secondary | ICD-10-CM | POA: Diagnosis not present

## 2014-09-30 DIAGNOSIS — M5136 Other intervertebral disc degeneration, lumbar region: Secondary | ICD-10-CM | POA: Diagnosis not present

## 2014-10-01 DIAGNOSIS — M9901 Segmental and somatic dysfunction of cervical region: Secondary | ICD-10-CM | POA: Diagnosis not present

## 2014-10-01 DIAGNOSIS — M9903 Segmental and somatic dysfunction of lumbar region: Secondary | ICD-10-CM | POA: Diagnosis not present

## 2014-10-01 DIAGNOSIS — M5135 Other intervertebral disc degeneration, thoracolumbar region: Secondary | ICD-10-CM | POA: Diagnosis not present

## 2014-10-01 DIAGNOSIS — M9907 Segmental and somatic dysfunction of upper extremity: Secondary | ICD-10-CM | POA: Diagnosis not present

## 2014-10-01 DIAGNOSIS — M62462 Contracture of muscle, left lower leg: Secondary | ICD-10-CM | POA: Diagnosis not present

## 2014-10-01 DIAGNOSIS — M5136 Other intervertebral disc degeneration, lumbar region: Secondary | ICD-10-CM | POA: Diagnosis not present

## 2014-10-01 DIAGNOSIS — M9906 Segmental and somatic dysfunction of lower extremity: Secondary | ICD-10-CM | POA: Diagnosis not present

## 2014-10-01 DIAGNOSIS — M9902 Segmental and somatic dysfunction of thoracic region: Secondary | ICD-10-CM | POA: Diagnosis not present

## 2014-10-05 DIAGNOSIS — M62462 Contracture of muscle, left lower leg: Secondary | ICD-10-CM | POA: Diagnosis not present

## 2014-10-05 DIAGNOSIS — M9906 Segmental and somatic dysfunction of lower extremity: Secondary | ICD-10-CM | POA: Diagnosis not present

## 2014-10-05 DIAGNOSIS — M9903 Segmental and somatic dysfunction of lumbar region: Secondary | ICD-10-CM | POA: Diagnosis not present

## 2014-10-05 DIAGNOSIS — M5136 Other intervertebral disc degeneration, lumbar region: Secondary | ICD-10-CM | POA: Diagnosis not present

## 2014-10-05 DIAGNOSIS — M9907 Segmental and somatic dysfunction of upper extremity: Secondary | ICD-10-CM | POA: Diagnosis not present

## 2014-10-05 DIAGNOSIS — M9902 Segmental and somatic dysfunction of thoracic region: Secondary | ICD-10-CM | POA: Diagnosis not present

## 2014-10-05 DIAGNOSIS — M9901 Segmental and somatic dysfunction of cervical region: Secondary | ICD-10-CM | POA: Diagnosis not present

## 2014-10-05 DIAGNOSIS — M5135 Other intervertebral disc degeneration, thoracolumbar region: Secondary | ICD-10-CM | POA: Diagnosis not present

## 2014-10-07 ENCOUNTER — Ambulatory Visit (AMBULATORY_SURGERY_CENTER): Payer: Self-pay

## 2014-10-07 VITALS — Ht 65.5 in | Wt 173.2 lb

## 2014-10-07 DIAGNOSIS — D509 Iron deficiency anemia, unspecified: Secondary | ICD-10-CM

## 2014-10-07 NOTE — Progress Notes (Signed)
Allergies to concentrated eggs and flu shot No diet/weight loss meds No home oxygen Has reacted to Fentanyl in the past  Has email  Emmi instructions given for endoscopy

## 2014-10-08 DIAGNOSIS — M9901 Segmental and somatic dysfunction of cervical region: Secondary | ICD-10-CM | POA: Diagnosis not present

## 2014-10-08 DIAGNOSIS — M9907 Segmental and somatic dysfunction of upper extremity: Secondary | ICD-10-CM | POA: Diagnosis not present

## 2014-10-08 DIAGNOSIS — M9902 Segmental and somatic dysfunction of thoracic region: Secondary | ICD-10-CM | POA: Diagnosis not present

## 2014-10-08 DIAGNOSIS — M62462 Contracture of muscle, left lower leg: Secondary | ICD-10-CM | POA: Diagnosis not present

## 2014-10-08 DIAGNOSIS — M9906 Segmental and somatic dysfunction of lower extremity: Secondary | ICD-10-CM | POA: Diagnosis not present

## 2014-10-08 DIAGNOSIS — M5135 Other intervertebral disc degeneration, thoracolumbar region: Secondary | ICD-10-CM | POA: Diagnosis not present

## 2014-10-08 DIAGNOSIS — M9903 Segmental and somatic dysfunction of lumbar region: Secondary | ICD-10-CM | POA: Diagnosis not present

## 2014-10-08 DIAGNOSIS — M5136 Other intervertebral disc degeneration, lumbar region: Secondary | ICD-10-CM | POA: Diagnosis not present

## 2014-10-09 ENCOUNTER — Ambulatory Visit (AMBULATORY_SURGERY_CENTER): Payer: Medicare Other | Admitting: Internal Medicine

## 2014-10-09 ENCOUNTER — Encounter: Payer: Self-pay | Admitting: Internal Medicine

## 2014-10-09 ENCOUNTER — Other Ambulatory Visit: Payer: Self-pay | Admitting: *Deleted

## 2014-10-09 ENCOUNTER — Other Ambulatory Visit (INDEPENDENT_AMBULATORY_CARE_PROVIDER_SITE_OTHER): Payer: Medicare Other

## 2014-10-09 VITALS — BP 119/68 | HR 78 | Temp 96.5°F | Resp 21

## 2014-10-09 DIAGNOSIS — I459 Conduction disorder, unspecified: Secondary | ICD-10-CM | POA: Diagnosis not present

## 2014-10-09 DIAGNOSIS — D509 Iron deficiency anemia, unspecified: Secondary | ICD-10-CM

## 2014-10-09 DIAGNOSIS — K317 Polyp of stomach and duodenum: Secondary | ICD-10-CM

## 2014-10-09 DIAGNOSIS — K208 Other esophagitis: Secondary | ICD-10-CM | POA: Diagnosis not present

## 2014-10-09 DIAGNOSIS — K2950 Unspecified chronic gastritis without bleeding: Secondary | ICD-10-CM | POA: Diagnosis not present

## 2014-10-09 DIAGNOSIS — D649 Anemia, unspecified: Secondary | ICD-10-CM | POA: Diagnosis not present

## 2014-10-09 LAB — CBC WITH DIFFERENTIAL/PLATELET
Basophils Absolute: 0 10*3/uL (ref 0.0–0.1)
Basophils Relative: 0.3 % (ref 0.0–3.0)
Eosinophils Absolute: 0.1 10*3/uL (ref 0.0–0.7)
Eosinophils Relative: 1.3 % (ref 0.0–5.0)
HCT: 38 % (ref 36.0–46.0)
Hemoglobin: 12.6 g/dL (ref 12.0–15.0)
Lymphocytes Relative: 23.9 % (ref 12.0–46.0)
Lymphs Abs: 2.3 10*3/uL (ref 0.7–4.0)
MCHC: 33.1 g/dL (ref 30.0–36.0)
MCV: 87 fl (ref 78.0–100.0)
Monocytes Absolute: 0.7 10*3/uL (ref 0.1–1.0)
Monocytes Relative: 7.7 % (ref 3.0–12.0)
Neutro Abs: 6.3 10*3/uL (ref 1.4–7.7)
Neutrophils Relative %: 66.8 % (ref 43.0–77.0)
Platelets: 273 10*3/uL (ref 150.0–400.0)
RBC: 4.36 Mil/uL (ref 3.87–5.11)
RDW: 14.9 % (ref 11.5–15.5)
WBC: 9.4 10*3/uL (ref 4.0–10.5)

## 2014-10-09 MED ORDER — SODIUM CHLORIDE 0.9 % IV SOLN: 500.0000 mL | INTRAVENOUS | Status: DC

## 2014-10-09 NOTE — Progress Notes (Signed)
Called to room to assist during endoscopic procedure.  Patient ID and intended procedure confirmed with present staff. Received instructions for my participation in the procedure from the performing physician.  

## 2014-10-09 NOTE — Patient Instructions (Signed)
YOU HAD AN ENDOSCOPIC PROCEDURE TODAY AT [facility]:   Refer to the procedure report that was given to you for any specific questions about what was found during the examination.  If the procedure report does not answer your questions, please call your gastroenterologist to clarify.  If you requested that your care partner not be given the details of your procedure findings, then the procedure report has been included in a sealed envelope for you to review at your convenience later.  YOU SHOULD EXPECT: Some feelings of bloating in the abdomen. Passage of more gas than usual.  Walking can help get rid of the air that was put into your GI tract during the procedure and reduce the bloating. If you had a lower endoscopy (such as a colonoscopy or flexible sigmoidoscopy) you may notice spotting of blood in your stool or on the toilet paper. If you underwent a bowel prep for your procedure, you may not have a normal bowel movement for a few days.  Please Note:  You might notice some irritation and congestion in your nose or some drainage.  This is from the oxygen used during your procedure.  There is no need for concern and it should clear up in a day or so.  SYMPTOMS TO REPORT IMMEDIATELY:    Following upper endoscopy (EGD)  Vomiting of blood or coffee ground material  New chest pain or pain under the shoulder blades  Painful or persistently difficult swallowing  New shortness of breath  Fever of 100F or higher  Black, tarry-looking stools  For urgent or emergent issues, a gastroenterologist can be reached at any hour by calling [phone].   DIET: Your first meal following the procedure should be a small meal and then it is ok to progress to your normal diet. Heavy or fried foods are harder to digest and may make you feel nauseous or bloated.  Likewise, meals heavy in dairy and vegetables can increase bloating.  Drink plenty of fluids but you should avoid alcoholic beverages  for 24 hours.  ACTIVITY:  You should plan to take it easy for the rest of today and you should NOT DRIVE or use heavy machinery until tomorrow (because of the sedation medicines used during the test).    FOLLOW UP: Our staff will call the number listed on your records the next business day following your procedure to check on you and address any questions or concerns that you may have regarding the information given to you following your procedure. If we do not reach you, we will leave a message.  However, if you are feeling well and you are not experiencing any problems, there is no need to return our call.  We will assume that you have returned to your regular daily activities without incident.  If any biopsies were taken you will be contacted by phone or by letter within the next 1-3 weeks.  Please call us at [phone] if you have not heard about the biopsies in 3 weeks.    SIGNATURES/CONFIDENTIALITY: You and/or your care partner have signed paperwork which will be entered into your electronic medical record.  These signatures attest to the fact that that the information above on your After Visit Summary has been reviewed and is understood.  Full responsibility of the confidentiality of this discharge information lies with you and/or your care-partner.  Hiatal hernia information given.  To lab for CBC after discharge.

## 2014-10-09 NOTE — Op Note (Signed)
[place]  [address]. [place], [id number]   ENDOSCOPY PROCEDURE REPORT  PATIENT: Ashley [name], Ashley [name]  MR#: [id number] BIRTHDATE: 1946-05-15 , 69  yrs. old GENDER: female ENDOSCOPIST: Lafayette Dragon, MD REFERRED BY:  [name], M.D. PROCEDURE DATE:  10/09/2014 PROCEDURE:  EGD w/ biopsy ASA CLASS:     Class II INDICATIONS:  iron deficiency anemia.  In January 2016 hemoglobin 9.8 hematocrit 30.4.  8.7% iron saturation.  History of heart disease.  CT scan shows no active process.  Patient had perforated diverticulitis in May 2015 requiring ostomy and takedown of colostomy in August 2015. MEDICATIONS: Monitored anesthesia care and Propofol 120 mg IV TOPICAL ANESTHETIC: none  DESCRIPTION OF PROCEDURE: After the risks benefits and alternatives of the procedure were thoroughly explained, informed consent was obtained.  The LB [id number] endoscope was introduced through the mouth and advanced to the second portion of the duodenum , Without limitations.  The instrument was slowly withdrawn as the mucosa was fully examined.    Esophagus: proximal, mid and distal esophageal mucosa was normal. Squamocolumnar junction was prominent and slightly irregular. Biopsies were obtained to rule out Barrett's esophagus  Stomach: there was a 1-2 cm reducible hiatal hernia without Cameron erosions. Gastric folds were unremarkable. There were multiple fundic gland polyps which were biopsied. Gastric antrum and pyloric outlet was normal. Retroflexion of the endoscope revealed presence of fundic gland polyps  Duodenjum: duodenal bulb and descending duodenum was normal[ The scope was then withdrawn from the patient and the procedure completed.  COMPLICATIONS: There were no immediate complications.  ENDOSCOPIC IMPRESSION: 1. small reducible hiatal hernia 2. Irregular Z line status post biopsies 3. Fundic gland polyps 4. Small bowel biopsies to rule out iron  malabsorption  Nothing in the above exam explains iron deficiency anemia. Patient is due for recall colonoscopy. Last exam was seen 2005. She has been reluctant to schedule because of extended postoperative recovery  RECOMMENDATIONS:  1.  Await pathology results 2.  Recommend screening colonoscopy 3. Check CBC today  REPEAT EXAM: for EGD pending biopsy results.  eSigned:  Lafayette Dragon, MD 10/09/2014 10:42 AM    CC:  PATIENT NAME:  Ashley [name], Ashley [name] MR#: [id number]

## 2014-10-09 NOTE — Progress Notes (Signed)
A/ox3 pleased with MAC, report to Jane RN 

## 2014-10-09 NOTE — Progress Notes (Signed)
Pt. To lab for CBC prior to leaving clinic.

## 2014-10-13 ENCOUNTER — Telehealth: Payer: Self-pay | Admitting: *Deleted

## 2014-10-13 DIAGNOSIS — M9903 Segmental and somatic dysfunction of lumbar region: Secondary | ICD-10-CM | POA: Diagnosis not present

## 2014-10-13 DIAGNOSIS — M9901 Segmental and somatic dysfunction of cervical region: Secondary | ICD-10-CM | POA: Diagnosis not present

## 2014-10-13 DIAGNOSIS — M9902 Segmental and somatic dysfunction of thoracic region: Secondary | ICD-10-CM | POA: Diagnosis not present

## 2014-10-13 DIAGNOSIS — M5136 Other intervertebral disc degeneration, lumbar region: Secondary | ICD-10-CM | POA: Diagnosis not present

## 2014-10-13 DIAGNOSIS — M9906 Segmental and somatic dysfunction of lower extremity: Secondary | ICD-10-CM | POA: Diagnosis not present

## 2014-10-13 DIAGNOSIS — G5622 Lesion of ulnar nerve, left upper limb: Secondary | ICD-10-CM | POA: Diagnosis not present

## 2014-10-13 DIAGNOSIS — G5621 Lesion of ulnar nerve, right upper limb: Secondary | ICD-10-CM | POA: Diagnosis not present

## 2014-10-13 DIAGNOSIS — M5135 Other intervertebral disc degeneration, thoracolumbar region: Secondary | ICD-10-CM | POA: Diagnosis not present

## 2014-10-13 DIAGNOSIS — M62462 Contracture of muscle, left lower leg: Secondary | ICD-10-CM | POA: Diagnosis not present

## 2014-10-13 DIAGNOSIS — M9907 Segmental and somatic dysfunction of upper extremity: Secondary | ICD-10-CM | POA: Diagnosis not present

## 2014-10-13 NOTE — Telephone Encounter (Signed)
No answer, left message to call if questions or concerns. 

## 2014-10-14 DIAGNOSIS — Z85828 Personal history of other malignant neoplasm of skin: Secondary | ICD-10-CM | POA: Diagnosis not present

## 2014-10-14 DIAGNOSIS — Z08 Encounter for follow-up examination after completed treatment for malignant neoplasm: Secondary | ICD-10-CM | POA: Diagnosis not present

## 2014-10-14 DIAGNOSIS — L570 Actinic keratosis: Secondary | ICD-10-CM | POA: Diagnosis not present

## 2014-10-15 DIAGNOSIS — M9903 Segmental and somatic dysfunction of lumbar region: Secondary | ICD-10-CM | POA: Diagnosis not present

## 2014-10-15 DIAGNOSIS — M9902 Segmental and somatic dysfunction of thoracic region: Secondary | ICD-10-CM | POA: Diagnosis not present

## 2014-10-15 DIAGNOSIS — M5136 Other intervertebral disc degeneration, lumbar region: Secondary | ICD-10-CM | POA: Diagnosis not present

## 2014-10-15 DIAGNOSIS — M9906 Segmental and somatic dysfunction of lower extremity: Secondary | ICD-10-CM | POA: Diagnosis not present

## 2014-10-15 DIAGNOSIS — M5135 Other intervertebral disc degeneration, thoracolumbar region: Secondary | ICD-10-CM | POA: Diagnosis not present

## 2014-10-15 DIAGNOSIS — M62462 Contracture of muscle, left lower leg: Secondary | ICD-10-CM | POA: Diagnosis not present

## 2014-10-15 DIAGNOSIS — M9907 Segmental and somatic dysfunction of upper extremity: Secondary | ICD-10-CM | POA: Diagnosis not present

## 2014-10-15 DIAGNOSIS — M9901 Segmental and somatic dysfunction of cervical region: Secondary | ICD-10-CM | POA: Diagnosis not present

## 2014-10-16 ENCOUNTER — Encounter: Payer: Self-pay | Admitting: Internal Medicine

## 2014-10-16 NOTE — Addendum Note (Signed)
Addended by: Steva Ready on: 10/16/2014 11:35 AM   Modules accepted: Level of Service

## 2014-10-19 ENCOUNTER — Encounter: Payer: Self-pay | Admitting: Internal Medicine

## 2014-10-19 DIAGNOSIS — M9903 Segmental and somatic dysfunction of lumbar region: Secondary | ICD-10-CM | POA: Diagnosis not present

## 2014-10-19 DIAGNOSIS — M9907 Segmental and somatic dysfunction of upper extremity: Secondary | ICD-10-CM | POA: Diagnosis not present

## 2014-10-19 DIAGNOSIS — M9906 Segmental and somatic dysfunction of lower extremity: Secondary | ICD-10-CM | POA: Diagnosis not present

## 2014-10-19 DIAGNOSIS — M5136 Other intervertebral disc degeneration, lumbar region: Secondary | ICD-10-CM | POA: Diagnosis not present

## 2014-10-19 DIAGNOSIS — M9901 Segmental and somatic dysfunction of cervical region: Secondary | ICD-10-CM | POA: Diagnosis not present

## 2014-10-19 DIAGNOSIS — M9902 Segmental and somatic dysfunction of thoracic region: Secondary | ICD-10-CM | POA: Diagnosis not present

## 2014-10-19 DIAGNOSIS — M5135 Other intervertebral disc degeneration, thoracolumbar region: Secondary | ICD-10-CM | POA: Diagnosis not present

## 2014-10-19 DIAGNOSIS — M62462 Contracture of muscle, left lower leg: Secondary | ICD-10-CM | POA: Diagnosis not present

## 2014-10-22 ENCOUNTER — Encounter: Payer: Self-pay | Admitting: Family Medicine

## 2014-10-24 DIAGNOSIS — G5621 Lesion of ulnar nerve, right upper limb: Secondary | ICD-10-CM | POA: Diagnosis not present

## 2014-10-27 DIAGNOSIS — G5621 Lesion of ulnar nerve, right upper limb: Secondary | ICD-10-CM | POA: Diagnosis not present

## 2014-10-27 DIAGNOSIS — G5622 Lesion of ulnar nerve, left upper limb: Secondary | ICD-10-CM | POA: Diagnosis not present

## 2014-10-27 DIAGNOSIS — M65332 Trigger finger, left middle finger: Secondary | ICD-10-CM | POA: Diagnosis not present

## 2014-10-28 ENCOUNTER — Encounter: Payer: Self-pay | Admitting: Family Medicine

## 2014-10-28 ENCOUNTER — Ambulatory Visit (INDEPENDENT_AMBULATORY_CARE_PROVIDER_SITE_OTHER): Payer: Medicare Other | Admitting: Family Medicine

## 2014-10-28 VITALS — BP 128/86 | HR 72 | Temp 97.8°F | Wt 172.0 lb

## 2014-10-28 DIAGNOSIS — R05 Cough: Secondary | ICD-10-CM | POA: Diagnosis not present

## 2014-10-28 DIAGNOSIS — R350 Frequency of micturition: Secondary | ICD-10-CM | POA: Diagnosis not present

## 2014-10-28 DIAGNOSIS — F411 Generalized anxiety disorder: Secondary | ICD-10-CM

## 2014-10-28 DIAGNOSIS — R197 Diarrhea, unspecified: Secondary | ICD-10-CM | POA: Diagnosis not present

## 2014-10-28 DIAGNOSIS — I5022 Chronic systolic (congestive) heart failure: Secondary | ICD-10-CM | POA: Diagnosis not present

## 2014-10-28 DIAGNOSIS — R059 Cough, unspecified: Secondary | ICD-10-CM

## 2014-10-28 LAB — COMPREHENSIVE METABOLIC PANEL
ALT: 18 U/L (ref 0–35)
AST: 16 U/L (ref 0–37)
Albumin: 4.7 g/dL (ref 3.5–5.2)
Alkaline Phosphatase: 122 U/L — ABNORMAL HIGH (ref 39–117)
BUN: 20 mg/dL (ref 6–23)
CO2: 27 mEq/L (ref 19–32)
Calcium: 10.1 mg/dL (ref 8.4–10.5)
Chloride: 100 mEq/L (ref 96–112)
Creatinine, Ser: 1.09 mg/dL (ref 0.40–1.20)
GFR: 52.86 mL/min — ABNORMAL LOW (ref >60.00)
Glucose, Bld: 102 mg/dL — ABNORMAL HIGH (ref 70–99)
Potassium: 4.4 mEq/L (ref 3.5–5.1)
Sodium: 135 mEq/L (ref 135–145)
Total Bilirubin: 0.3 mg/dL (ref 0.2–1.2)
Total Protein: 8.2 g/dL (ref 6.0–8.3)

## 2014-10-28 MED ORDER — SERTRALINE HCL 25 MG PO TABS: 25.0000 mg | ORAL_TABLET | Freq: Every day | ORAL | Status: DC

## 2014-10-28 MED ORDER — ALPRAZOLAM 0.25 MG PO TABS
0.2500 mg | ORAL_TABLET | Freq: Two times a day (BID) | ORAL | Status: DC | PRN
Start: 2014-10-28 — End: 2014-12-25

## 2014-10-28 NOTE — Progress Notes (Signed)
BP 128/86 mmHg  Pulse 72  Temp(Src) 97.8 F (36.6 C) (Oral)  Wt 172 lb (78.019 kg)  SpO2 98%   CC: chest pain with anxiety "feels like walking pneumonia" Subjective:    Patient ID: Ashley [name], female    DOB: [birth year], 69 y.o.   MRN: [id number]  HPI: Ashley [name] is a 69 y.o. female presenting on 10/28/2014 for Pain; Chest Pain; and Anxiety   Intermittent chest pains for months described as tightness. Some coughing as well, mildly productive. Chills daily but no fever, at times throat feels swollen but no pain.   No significant PNdrainage, head or chest congestion. No leg swelling or dyspnea.  Over weekend had anxiety episode that led to chest tightness and wheezing.  Thinks TMJ flaring up.   Takes alprazolam 1/2 tab bid Tues/Thurs.   Iron stopped - no longer anemic. EGD reassuring but did find "hernia and polyps".  Watery diarrhea over last 2 weeks since EGD associated with abd discomfort described as cramping. Appetite decreased. No recent antibiotics.   Relevant past medical, surgical, family and social history reviewed and updated as indicated. Interim medical history since our last visit reviewed. Allergies and medications reviewed and updated. Current Outpatient Prescriptions on File Prior to Visit  Medication Sig  . acetaminophen (TYLENOL) 325 MG tablet Take 325 mg by mouth 2 (two) times daily as needed for moderate pain.  Marland Kitchen alendronate (FOSAMAX) 70 MG tablet Take 70 mg by mouth every Thursday. Take with a full glass of water on an empty stomach.  . Ascorbic Acid (VITAMIN C) 1000 MG tablet Take 1,000 mg by mouth daily.  Marland Kitchen aspirin EC 81 MG tablet Take 81 mg by mouth at bedtime.   Marland Kitchen atorvastatin (LIPITOR) 40 MG tablet Take 1 tablet (40 mg total) by mouth every other day.  . Calcium Carbonate-Vitamin D (CALCIUM + D PO) Take 1 tablet by mouth daily.  . carvedilol (COREG) 12.5 MG tablet Take 1 tablet (12.5 mg total) by mouth 2 (two) times daily with a meal.    . Coenzyme Q10 (CO Q 10 PO) Take 1 tablet by mouth daily.  Marland Kitchen dextromethorphan (DELSYM) 30 MG/5ML liquid Take 30 mg by mouth as needed for cough.  Marland Kitchen omeprazole (PRILOSEC) 40 MG capsule Take 1 capsule (40 mg total) by mouth 2 (two) times daily before a meal.  . polyethylene glycol (MIRALAX / GLYCOLAX) packet Take 9 g by mouth daily.  Marland Kitchen PRESCRIPTION MEDICATION Place 1 application into both ears daily as needed (MED NAME: mometasone 0.1% SOLUTION (for eczema on ears)).  . Probiotic Product (PROBIOTIC DAILY) CAPS Take 1 capsule by mouth daily.  Marland Kitchen spironolactone (ALDACTONE) 25 MG tablet Take 25 mg by mouth every morning.  . triamcinolone (NASACORT) 55 MCG/ACT AERO nasal inhaler Place 2 sprays into the nose 2 (two) times daily.  . vitamin E 100 UNIT capsule Take 100 Units by mouth daily.  . Wheat Dextrin (BENEFIBER DRINK MIX PO) Take 1 application by mouth daily. Pt takes [facility] Natural Fiber  . KLOR-CON 10 10 MEQ tablet   . torsemide (DEMADEX) 20 MG tablet   . [DISCONTINUED] bisoprolol (ZEBETA) 5 MG tablet Take 5 mg by mouth daily.   No current facility-administered medications on file prior to visit.    Review of Systems Per HPI unless specifically indicated above     Objective:    BP 128/86 mmHg  Pulse 72  Temp(Src) 97.8 F (36.6 C) (Oral)  Wt 172 lb (78.019 kg)  SpO2 98%  Wt Readings from Last 3 Encounters:  10/28/14 172 lb (78.019 kg)  10/07/14 173 lb 3.2 oz (78.563 kg)  09/04/14 169 lb 8 oz (76.885 kg)    Physical Exam  Constitutional: She appears well-developed and well-nourished. No distress.  HENT:  Head: Normocephalic and atraumatic.  Right Ear: Hearing, tympanic membrane, external ear and ear canal normal.  Left Ear: Hearing, tympanic membrane, external ear and ear canal normal.  Nose: No mucosal edema or rhinorrhea. Right sinus exhibits maxillary sinus tenderness. Right sinus exhibits no frontal sinus tenderness. Left sinus exhibits maxillary sinus tenderness. Left sinus  exhibits no frontal sinus tenderness.  Mouth/Throat: Uvula is midline, oropharynx is clear and moist and mucous membranes are normal. No oropharyngeal exudate, posterior oropharyngeal edema, posterior oropharyngeal erythema or tonsillar abscesses.  Dry mucous membranes  Eyes: Conjunctivae and EOM are normal. Pupils are equal, round, and reactive to light. No scleral icterus.  Neck: Normal range of motion. Neck supple.  Cardiovascular: Normal rate, regular rhythm, normal heart sounds and intact distal pulses.   No murmur heard. Pulmonary/Chest: Effort normal and breath sounds normal. No respiratory distress. She has no wheezes. She has no rales.  Abdominal: Soft. Bowel sounds are normal. She exhibits no distension and no mass. There is no hepatosplenomegaly. There is tenderness. There is no rigidity, no rebound, no guarding, no CVA tenderness and negative Murphy's sign.  Scarring present  Lymphadenopathy:    She has no cervical adenopathy.  Skin: Skin is warm and dry. No rash noted.  Nursing note and vitals reviewed.  Results for orders placed or performed in visit on 10/09/14  CBC with Differential/Platelet  Result Value Ref Range   WBC 9.4 4.0 - 10.5 K/uL   RBC 4.36 3.87 - 5.11 Mil/uL   Hemoglobin 12.6 12.0 - 15.0 g/dL   HCT 38.0 36.0 - 46.0 %   MCV 87.0 78.0 - 100.0 fl   MCHC 33.1 30.0 - 36.0 g/dL   RDW 14.9 11.5 - 15.5 %   Platelets 273.0 150.0 - 400.0 K/uL   Neutrophils Relative % 66.8 43.0 - 77.0 %   Lymphocytes Relative 23.9 12.0 - 46.0 %   Monocytes Relative 7.7 3.0 - 12.0 %   Eosinophils Relative 1.3 0.0 - 5.0 %   Basophils Relative 0.3 0.0 - 3.0 %   Neutro Abs 6.3 1.4 - 7.7 K/uL   Lymphs Abs 2.3 0.7 - 4.0 K/uL   Monocytes Absolute 0.7 0.1 - 1.0 K/uL   Eosinophils Absolute 0.1 0.0 - 0.7 K/uL   Basophils Absolute 0.0 0.0 - 0.1 K/uL      Assessment & Plan:   Problem List Items Addressed This Visit    Anxiety state    Deteriorated, increased irritability and  sensitivity. Uses xanax prn. We will restart zoloft 25mg  daily - sent to pharmacy. Previously has tolerated this med well. Pt agrees with plan.      Relevant Medications   ALPRAZolam (XANAX) 0.25 MG tablet   sertraline (ZOLOFT) 25 MG tablet   Chronic systolic CHF (congestive heart failure)    Seems euvolemic today. Off torsemide and Kdur.      Cough    Minimal. Not consistent with walking PNA - lungs clear, good O2 sat, no fevers. Anticipate malaise more from diarrhea and GI issues.      Diarrhea - Primary    Ongoing for 2 weeks. No recent abx use. Check for c diff as well as CBC and CMP today. Anticipate cause of her  malaise today. rec continued pushing fluids and staying well hydrated. Pt states this does not feel like fibromyalgia flare.      Relevant Orders   Comprehensive metabolic panel   CBC with Differential/Platelet   C. difficile GDH and Toxin A/B       Follow up plan: Return if symptoms worsen or fail to improve.

## 2014-10-28 NOTE — Assessment & Plan Note (Addendum)
Ongoing for 2 weeks. No recent abx use. Check for c diff as well as CBC and CMP today. Anticipate cause of her malaise today. rec continued pushing fluids and staying well hydrated. Pt states this does not feel like fibromyalgia flare.

## 2014-10-28 NOTE — Assessment & Plan Note (Signed)
Minimal. Not consistent with walking PNA - lungs clear, good O2 sat, no fevers. Anticipate malaise more from diarrhea and GI issues.

## 2014-10-28 NOTE — Assessment & Plan Note (Signed)
Deteriorated, increased irritability and sensitivity. Uses xanax prn. We will restart zoloft 25mg  daily - sent to pharmacy. Previously has tolerated this med well. Pt agrees with plan.

## 2014-10-28 NOTE — Addendum Note (Signed)
Addended by: Ellamae Sia on: 10/28/2014 04:21 PM   Modules accepted: Orders

## 2014-10-28 NOTE — Assessment & Plan Note (Signed)
Seems euvolemic today. Off torsemide and Kdur.

## 2014-10-28 NOTE — Addendum Note (Signed)
Addended by: Ellamae Sia on: 10/28/2014 12:25 PM   Modules accepted: Orders

## 2014-10-28 NOTE — Patient Instructions (Addendum)
labwork today, stool test today. Lungs sound good today.  Continue omeprazole 40mg  twice daily Let's start sertraline (zoloft) 25mg  once daily. Med sent to pharmacy.

## 2014-10-28 NOTE — Progress Notes (Signed)
Pre visit review using our clinic review tool, if applicable. No additional management support is needed unless otherwise documented below in the visit note. 

## 2014-10-29 ENCOUNTER — Ambulatory Visit (INDEPENDENT_AMBULATORY_CARE_PROVIDER_SITE_OTHER)
Admission: RE | Admit: 2014-10-29 | Discharge: 2014-10-29 | Disposition: A | Discharge status: Home / Self Care | Payer: Medicare Other | Source: Ambulatory Visit | Attending: Family Medicine | Admitting: Family Medicine

## 2014-10-29 ENCOUNTER — Other Ambulatory Visit: Payer: Self-pay | Admitting: Family Medicine

## 2014-10-29 ENCOUNTER — Telehealth: Payer: Self-pay | Admitting: Radiology

## 2014-10-29 DIAGNOSIS — R05 Cough: Secondary | ICD-10-CM

## 2014-10-29 DIAGNOSIS — R059 Cough, unspecified: Secondary | ICD-10-CM

## 2014-10-29 LAB — CBC WITH DIFFERENTIAL/PLATELET
Basophils Absolute: 0.1 10*3/uL (ref 0.0–0.1)
Basophils Relative: 0.6 % (ref 0.0–3.0)
Eosinophils Absolute: 0 10*3/uL (ref 0.0–0.7)
Eosinophils Relative: 0.2 % (ref 0.0–5.0)
HCT: 35.7 % — ABNORMAL LOW (ref 36.0–46.0)
Hemoglobin: 11.7 g/dL — ABNORMAL LOW (ref 12.0–15.0)
Lymphocytes Relative: 12.8 % (ref 12.0–46.0)
Lymphs Abs: 2.4 10*3/uL (ref 0.7–4.0)
MCHC: 32.8 g/dL (ref 30.0–36.0)
MCV: 88.3 fl (ref 78.0–100.0)
Monocytes Absolute: 1.1 10*3/uL — ABNORMAL HIGH (ref 0.1–1.0)
Monocytes Relative: 6.2 % (ref 3.0–12.0)
Neutro Abs: 14.8 10*3/uL — ABNORMAL HIGH (ref 1.4–7.7)
Neutrophils Relative %: 80.2 % — ABNORMAL HIGH (ref 43.0–77.0)
Platelets: 231 10*3/uL (ref 150.0–400.0)
RBC: 4.04 Mil/uL (ref 3.87–5.11)
RDW: 14.3 % (ref 11.5–15.5)
WBC: 18.4 10*3/uL (ref 4.0–10.5)

## 2014-10-29 LAB — POCT URINALYSIS DIPSTICK
Bilirubin, UA: NEGATIVE
Blood, UA: NEGATIVE
Glucose, UA: NEGATIVE
Ketones, UA: NEGATIVE
Leukocytes, UA: NEGATIVE
Nitrite, UA: NEGATIVE
Protein, UA: NEGATIVE
Spec Grav, UA: 1.025
Urobilinogen, UA: 0.2
pH, UA: 6

## 2014-10-29 LAB — C. DIFFICILE GDH AND TOXIN A/B
C. difficile GDH: NOT DETECTED
C. difficile Toxin A/B: NOT DETECTED

## 2014-10-29 NOTE — Addendum Note (Signed)
Addended by: Royann Shivers A on: 10/29/2014 05:56 PM   Modules accepted: Orders

## 2014-10-29 NOTE — Telephone Encounter (Signed)
[facility] called a critical result, WBC 18.6. Results given to Dr Danise Mina

## 2014-10-29 NOTE — Telephone Encounter (Signed)
See lab result note.

## 2014-10-30 ENCOUNTER — Other Ambulatory Visit: Payer: Self-pay | Admitting: *Deleted

## 2014-10-30 ENCOUNTER — Telehealth: Payer: Self-pay

## 2014-10-30 MED ORDER — CIPROFLOXACIN HCL 500 MG PO TABS: 500.0000 mg | ORAL_TABLET | Freq: Two times a day (BID) | ORAL | Status: DC

## 2014-10-30 NOTE — Telephone Encounter (Signed)
See result note.  

## 2014-10-30 NOTE — Telephone Encounter (Signed)
Pt left v/m requesting cb when CXR report is available.

## 2014-11-02 NOTE — Telephone Encounter (Signed)
Late entry-Spoke with patient 10/30/14.

## 2014-11-06 ENCOUNTER — Ambulatory Visit (INDEPENDENT_AMBULATORY_CARE_PROVIDER_SITE_OTHER): Payer: Medicare Other | Admitting: Family Medicine

## 2014-11-06 ENCOUNTER — Encounter: Payer: Self-pay | Admitting: Family Medicine

## 2014-11-06 VITALS — BP 106/62 | HR 85 | Temp 98.0°F | Wt 174.2 lb

## 2014-11-06 DIAGNOSIS — R197 Diarrhea, unspecified: Secondary | ICD-10-CM

## 2014-11-06 DIAGNOSIS — R059 Cough, unspecified: Secondary | ICD-10-CM

## 2014-11-06 DIAGNOSIS — R05 Cough: Secondary | ICD-10-CM | POA: Diagnosis not present

## 2014-11-06 LAB — CBC WITH DIFFERENTIAL/PLATELET
Basophils Absolute: 0 10*3/uL (ref 0.0–0.1)
Basophils Relative: 0 % (ref 0–1)
Eosinophils Absolute: 0.2 10*3/uL (ref 0.0–0.7)
Eosinophils Relative: 2 % (ref 0–5)
HCT: 35.9 % — ABNORMAL LOW (ref 36.0–46.0)
Hemoglobin: 11.8 g/dL — ABNORMAL LOW (ref 12.0–15.0)
Lymphocytes Relative: 24 % (ref 12–46)
Lymphs Abs: 2.9 10*3/uL (ref 0.7–4.0)
MCH: 28.7 pg (ref 26.0–34.0)
MCHC: 32.9 g/dL (ref 30.0–36.0)
MCV: 87.3 fL (ref 78.0–100.0)
MPV: 12.5 fL — ABNORMAL HIGH (ref 8.6–12.4)
Monocytes Absolute: 1.1 10*3/uL — ABNORMAL HIGH (ref 0.1–1.0)
Monocytes Relative: 9 % (ref 3–12)
Neutro Abs: 7.7 10*3/uL (ref 1.7–7.7)
Neutrophils Relative %: 65 % (ref 43–77)
Platelets: 256 10*3/uL (ref 150–400)
RBC: 4.11 MIL/uL (ref 3.87–5.11)
RDW: 14.4 % (ref 11.5–15.5)
WBC: 11.9 10*3/uL — ABNORMAL HIGH (ref 4.0–10.5)

## 2014-11-06 NOTE — Progress Notes (Signed)
BP 106/62 mmHg  Pulse 85  Temp(Src) 98 F (36.7 C) (Oral)  Wt 174 lb 4 oz (79.039 kg)  SpO2 95%   CC: f/u visit  Subjective:    Patient ID: Ashley [name], female    DOB: July 06, 1945, 69 y.o.   MRN: [id number]  HPI: Ashley [name] is a 69 y.o. female presenting on 11/06/2014 for Follow-up   See prior note for details. Briefly, seen here 10/28/2014 with malaise, cough, anxiety and diarrhea. Found to have elevated white count to 18 but she didn't have significant abdominal discomfort. Given complicated diverticulitis history I treated her with 7d cipro course which she is now finishing. Persistent diarrhea like clock work after meals. No abdominal pain, nausea or vomiting. Intermittent cold chills. Persistent abdominal discomfort with bending over. Appetite ok, eating normally.   Cough overall improved. Taking codeine cough syrup. Less chest tightness otherwise symptoms have persisted.   UA normal. C diff negative. CMP overall normal with GFR 52.  She was gone all this [place] to the [place]. Enjoyed her trip.  No recent steroid, tylenol or ibuprofen use.  Relevant past medical, surgical, family and social history reviewed and updated as indicated. Interim medical history since our last visit reviewed. Allergies and medications reviewed and updated. Current Outpatient Prescriptions on File Prior to Visit  Medication Sig  . acetaminophen (TYLENOL) 325 MG tablet Take 325 mg by mouth 2 (two) times daily as needed for moderate pain.  Marland Kitchen alendronate (FOSAMAX) 70 MG tablet Take 70 mg by mouth every Thursday. Take with a full glass of water on an empty stomach.  . ALPRAZolam (XANAX) 0.25 MG tablet Take 1 tablet (0.25 mg total) by mouth 2 (two) times daily as needed for anxiety.  . Ascorbic Acid (VITAMIN C) 1000 MG tablet Take 1,000 mg by mouth daily.  Marland Kitchen aspirin EC 81 MG tablet Take 81 mg by mouth at bedtime.   Marland Kitchen atorvastatin (LIPITOR) 40 MG tablet Take 1 tablet (40 mg total) by  mouth every other day.  . Calcium Carbonate-Vitamin D (CALCIUM + D PO) Take 1 tablet by mouth daily.  . carvedilol (COREG) 12.5 MG tablet Take 1 tablet (12.5 mg total) by mouth 2 (two) times daily with a meal.  . ciprofloxacin (CIPRO) 500 MG tablet Take 1 tablet (500 mg total) by mouth 2 (two) times daily.  . Coenzyme Q10 (CO Q 10 PO) Take 1 tablet by mouth daily.  Marland Kitchen dextromethorphan (DELSYM) 30 MG/5ML liquid Take 30 mg by mouth as needed for cough.  Marland Kitchen KLOR-CON 10 10 MEQ tablet   . omeprazole (PRILOSEC) 40 MG capsule Take 1 capsule (40 mg total) by mouth 2 (two) times daily before a meal.  . polyethylene glycol (MIRALAX / GLYCOLAX) packet Take 9 g by mouth daily.  Marland Kitchen PRESCRIPTION MEDICATION Place 1 application into both ears daily as needed (MED NAME: mometasone 0.1% SOLUTION (for eczema on ears)).  . Probiotic Product (PROBIOTIC DAILY) CAPS Take 1 capsule by mouth daily.  . sertraline (ZOLOFT) 25 MG tablet Take 1 tablet (25 mg total) by mouth daily.  Marland Kitchen spironolactone (ALDACTONE) 25 MG tablet Take 25 mg by mouth every morning.  . torsemide (DEMADEX) 20 MG tablet   . triamcinolone (NASACORT) 55 MCG/ACT AERO nasal inhaler Place 2 sprays into the nose 2 (two) times daily.  . vitamin E 100 UNIT capsule Take 100 Units by mouth daily.  . Wheat Dextrin (BENEFIBER DRINK MIX PO) Take 1 application by mouth daily. Pt takes [facility]  Natural Fiber  . [DISCONTINUED] bisoprolol (ZEBETA) 5 MG tablet Take 5 mg by mouth daily.   No current facility-administered medications on file prior to visit.    Review of Systems Per HPI unless specifically indicated above     Objective:    BP 106/62 mmHg  Pulse 85  Temp(Src) 98 F (36.7 C) (Oral)  Wt 174 lb 4 oz (79.039 kg)  SpO2 95%  Wt Readings from Last 3 Encounters:  11/06/14 174 lb 4 oz (79.039 kg)  10/28/14 172 lb (78.019 kg)  10/07/14 173 lb 3.2 oz (78.563 kg)    Physical Exam  Constitutional: She appears well-developed and well-nourished. No distress.    HENT:  Right Ear: Hearing, tympanic membrane, external ear and ear canal normal.  Left Ear: Hearing, tympanic membrane, external ear and ear canal normal.  Mouth/Throat: Oropharynx is clear and moist. No oropharyngeal exudate.  Cardiovascular: Normal rate, regular rhythm, normal heart sounds and intact distal pulses.   No murmur heard. Pulmonary/Chest: Effort normal and breath sounds normal. No respiratory distress. She has no wheezes. She has no rales.  Abdominal: Soft. Normal appearance and bowel sounds are normal. She exhibits no distension and no mass. There is no hepatosplenomegaly. There is tenderness (mild diffuse ). There is no rebound and no guarding.  Midline incision  Musculoskeletal: She exhibits no edema.  Skin: Skin is warm and dry. No rash noted.  Psychiatric: She has a normal mood and affect.  Nursing note and vitals reviewed.  Results for orders placed or performed in visit on 11/06/14  CBC with Differential/Platelet  Result Value Ref Range   WBC 11.9 (H) 4.0 - 10.5 K/uL   RBC 4.11 3.87 - 5.11 MIL/uL   Hemoglobin 11.8 (L) 12.0 - 15.0 g/dL   HCT 35.9 (L) 36.0 - 46.0 %   MCV 87.3 78.0 - 100.0 fL   MCH 28.7 26.0 - 34.0 pg   MCHC 32.9 30.0 - 36.0 g/dL   RDW 14.4 11.5 - 15.5 %   Platelets 256 150 - 400 K/uL   MPV 12.5 (H) 8.6 - 12.4 fL   Neutrophils Relative % 65 43 - 77 %   Neutro Abs 7.7 1.7 - 7.7 K/uL   Lymphocytes Relative 24 12 - 46 %   Lymphs Abs 2.9 0.7 - 4.0 K/uL   Monocytes Relative 9 3 - 12 %   Monocytes Absolute 1.1 (H) 0.1 - 1.0 K/uL   Eosinophils Relative 2 0 - 5 %   Eosinophils Absolute 0.2 0.0 - 0.7 K/uL   Basophils Relative 0 0 - 1 %   Basophils Absolute 0.0 0.0 - 0.1 K/uL   Smear Review Criteria for review not met       Assessment & Plan:   Problem List Items Addressed This Visit    Cough    Stable to improving. Uses codeine cough syrup PRN      Diarrhea - Primary    Persistent diarrhea, but ongoing since she had her anastomosis surgery  after diverticulitis complicated by perforation.  With elevated WBC to 18, we treated last week with 1 wk cipro course. sxs have not significantly changed. Recent C diff negative. Will recheck CBC and if elevated consider CT abd/pelvis.  Will also consider stool culture pending results of CBC. Pt agrees with plan.      Relevant Orders   CBC with Differential/Platelet (Completed)       Follow up plan: Return if symptoms worsen or fail to improve.

## 2014-11-06 NOTE — Progress Notes (Signed)
Pre visit review using our clinic review tool, if applicable. No additional management support is needed unless otherwise documented below in the visit note. 

## 2014-11-06 NOTE — Patient Instructions (Signed)
Blood work today. We will call you with results and plan. Finish antibiotic course.

## 2014-11-07 ENCOUNTER — Encounter: Payer: Self-pay | Admitting: Internal Medicine

## 2014-11-07 NOTE — Assessment & Plan Note (Signed)
Persistent diarrhea, but ongoing since she had her anastomosis surgery after diverticulitis complicated by perforation.  With elevated WBC to 18, we treated last week with 1 wk cipro course. sxs have not significantly changed. Recent C diff negative. Will recheck CBC and if elevated consider CT abd/pelvis.  Will also consider stool culture pending results of CBC. Pt agrees with plan.

## 2014-11-07 NOTE — Assessment & Plan Note (Signed)
Stable to improving. Uses codeine cough syrup PRN

## 2014-11-11 ENCOUNTER — Telehealth: Payer: Self-pay

## 2014-11-11 NOTE — Telephone Encounter (Signed)
Pt left v/m requesting 11/06/14 lab results; spoke with pt and advised per mychart note. Pt voiced understanding and will cb if needed.

## 2014-11-12 ENCOUNTER — Ambulatory Visit (INDEPENDENT_AMBULATORY_CARE_PROVIDER_SITE_OTHER): Payer: Medicare Other | Admitting: Internal Medicine

## 2014-11-12 ENCOUNTER — Encounter: Payer: Self-pay | Admitting: Internal Medicine

## 2014-11-12 VITALS — BP 116/70 | HR 90 | Ht 65.5 in | Wt 171.2 lb

## 2014-11-12 DIAGNOSIS — I429 Cardiomyopathy, unspecified: Secondary | ICD-10-CM

## 2014-11-12 DIAGNOSIS — I951 Orthostatic hypotension: Secondary | ICD-10-CM | POA: Diagnosis not present

## 2014-11-12 DIAGNOSIS — I420 Dilated cardiomyopathy: Secondary | ICD-10-CM

## 2014-11-12 DIAGNOSIS — I5022 Chronic systolic (congestive) heart failure: Secondary | ICD-10-CM

## 2014-11-12 DIAGNOSIS — Z95810 Presence of automatic (implantable) cardiac defibrillator: Secondary | ICD-10-CM | POA: Diagnosis not present

## 2014-11-12 LAB — CUP PACEART INCLINIC DEVICE CHECK
Battery Voltage: 3.1 V
Brady Statistic RV Percent Paced: 0.01 %
Date Time Interrogation Session: 20160630133435
HighPow Impedance: 87 Ohm
Lead Channel Impedance Value: 589 Ohm
Lead Channel Pacing Threshold Amplitude: 0.5 V
Lead Channel Pacing Threshold Pulse Width: 0.4 ms
Lead Channel Sensing Intrinsic Amplitude: 9.375 mV
Lead Channel Setting Pacing Amplitude: 2.5 V
Lead Channel Setting Pacing Pulse Width: 0.4 ms
Lead Channel Setting Sensing Sensitivity: 0.3 mV
Zone Setting Detection Interval: 300 ms
Zone Setting Detection Interval: 360 ms
Zone Setting Detection Interval: 450 ms

## 2014-11-12 NOTE — Progress Notes (Signed)
HPI [name] returns today for followup. She is a pleasant 69 yo woman with an DCM, chronic systolic CHF, s/p ICD implant. In the interim, she has had diverticulitis complicated by viscous perforation, abscess and drainage with a diverting colostomy. She has had multiple procedure in the interim. She denies fevers or chills. She still has some residual pain but is improved.  Allergies  Allergen Reactions  . Antihistamines, Chlorpheniramine-Type Other (See Comments)    Makes her eyes look like she sees strobe lights  . Codeine Other (See Comments)    Confusion and dizziness  . Cyclobenzaprine Other (See Comments)    Adverse reaction - back and throat pain, dizziness, exhaustion  . Cymbalta [Duloxetine Hcl] Other (See Comments)    "body spasms and made me feel weird in my head"   . Influenza Vac Split [Flu Virus Vaccine] Other (See Comments)    "allergic to concentrated eggs that are in vaccine"  . Penicillins Other (See Comments)    REACTION: "told 50 years ago I couldn't take it; might be immune to it", able to take amoxicillin  . Sulfasalazine Other (See Comments)    "makes my whole body smell like sulfa & makes me sick just smelling it"  . Atorvastatin Other (See Comments)    Severe muscle cramps to generic atorvastatin.  Able to take brand lipitor.  . Chlorhexidine Other (See Comments)    blisters  . Dilaudid [Hydromorphone Hcl] Other (See Comments)    Feels like she is burning internally   . Latex Nausea Only, Rash and Other (See Comments)    Pain and infection  . Morphine And Related Other (See Comments)    Internally feels like she is on fire   . Pravastatin Other (See Comments)    myalgias  . Prevacid [Lansoprazole] Other (See Comments)    Worsened GI side effects  . Red Dye Nausea Only and Swelling  . Rosuvastatin Other (See Comments)    Was not effective controlling lipids  . Simvastatin Other (See Comments)    Leg cramps  . Other     NOTE: pt is able to take  cephalosporins without reaction  . Tape Other (See Comments)    All adhesives  - Blisters, itching and burning   . Metaxalone Other (See Comments)    REACTION: unknown     Current Outpatient Prescriptions  Medication Sig Dispense Refill  . acetaminophen (TYLENOL) 325 MG tablet Take 325 mg by mouth 2 (two) times daily as needed for moderate pain.    Marland Kitchen alendronate (FOSAMAX) 70 MG tablet Take 70 mg by mouth every Thursday. Take with a full glass of water on an empty stomach.    . ALPRAZolam (XANAX) 0.25 MG tablet Take 1 tablet (0.25 mg total) by mouth 2 (two) times daily as needed for anxiety. 60 tablet 0  . Ascorbic Acid (VITAMIN C) 1000 MG tablet Take 1,000 mg by mouth daily.    Marland Kitchen aspirin EC 81 MG tablet Take 81 mg by mouth at bedtime.     Marland Kitchen atorvastatin (LIPITOR) 40 MG tablet Take 1 tablet (40 mg total) by mouth every other day. 45 tablet 2  . B Complex Vitamins (VITAMIN B COMPLEX PO) Take 1 capsule by mouth daily.    . Calcium Carbonate-Vitamin D (CALCIUM + D PO) Take 1 tablet by mouth daily.    . carvedilol (COREG) 12.5 MG tablet Take 1 tablet (12.5 mg total) by mouth 2 (two) times daily with a meal. 180 tablet 3  .  Coenzyme Q10 (CO Q 10 PO) Take 1 tablet by mouth daily.    Marland Kitchen dextromethorphan (DELSYM) 30 MG/5ML liquid Take 30 mg by mouth daily as needed for cough.     . IRON PO Take 1 capsule by mouth every other day.    Marland Kitchen omeprazole (PRILOSEC) 40 MG capsule Take 1 capsule (40 mg total) by mouth 2 (two) times daily before a meal. 180 capsule 1  . polyethylene glycol (MIRALAX / GLYCOLAX) packet Take 9 g by mouth daily.    Marland Kitchen PRESCRIPTION MEDICATION Place 1 application into both ears daily as needed (MED NAME: mometasone 0.1% SOLUTION (for eczema on ears)).    . Probiotic Product (PROBIOTIC DAILY) CAPS Take 1 capsule by mouth daily.    . Pyridoxine HCl (VITAMIN B6 PO) Take 1 capsule by mouth daily.    . sertraline (ZOLOFT) 25 MG tablet Take 1 tablet (25 mg total) by mouth daily. 30 tablet  6  . spironolactone (ALDACTONE) 25 MG tablet Take 25 mg by mouth every morning.    . triamcinolone (NASACORT) 55 MCG/ACT AERO nasal inhaler Place 2 sprays into the nose 2 (two) times daily.    . vitamin E 100 UNIT capsule Take 100 Units by mouth daily.    . Wheat Dextrin (BENEFIBER DRINK MIX PO) Take 1 packet by mouth daily. Pt takes [facility] Natural Fiber    . [DISCONTINUED] bisoprolol (ZEBETA) 5 MG tablet Take 5 mg by mouth daily.     No current facility-administered medications for this visit.     Past Medical History  Diagnosis Date  . Migraine without aura, without mention of intractable migraine without mention of status migrainosus   . History of diverticulitis of colon   . Pure hypercholesterolemia   . Irritable bowel syndrome   . Laryngeal nodule     Dr. Thomasena Edis  . Fibrocystic breast disease   . Hemorrhoids   . Anxiety   . GERD (gastroesophageal reflux disease)   . Systolic CHF     EF 56-31%, LV dilation, significant MR, goal weight 150lbs  . Nonischemic cardiomyopathy   . History of pneumonia 2012  . Mitral regurgitation   . Anxiety   . Bronchiectasis     possibly mild, treat URIs aggressively  . Hypertension   . Heart murmur   . Angina   . Lung disease 07/14/11  . Shortness of breath     "resting"  . Anemia   . Fibromyalgia   . Depression   . Disorder of vocal cords   . Chronic sinusitis   . Insomnia   . Osteoporosis 01/2013  . sigmoid diverticulitis with perforation, abscess and fistula 09/30/2013  . H/O multiple allergies 10/10/2013  . PONV (postoperative nausea and vomiting)   . CHF (congestive heart failure) 2011  . Pneumonia 2011  . Pacemaker   . Automatic implantable cardioverter-defibrillator in situ 2012  . Osteoarthritis     knees, back, hands, low back,   . Cancer     basal cell, arms & neck  . Sleep apnea 2010    borderline, inconclusive- /w    . Hemorrhoids   . Diarrhea 07/14/2008    Qualifier: Diagnosis of  By: Glori Bickers MD, Carmell Austria   .  Allergy     ROS:   All systems reviewed and negative except as noted in the HPI.   Past Surgical History  Procedure Laterality Date  . Appendectomy    . Mandible surgery      "got about  6 pins in the bottom of my jaw"  . Knee arthroscopy w/ orif      Left; "meniscus tear"  . Osteotomy      Left foot  . 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 Dexa  03/28/99    osteopenia L/S  . Colonoscopy  1998    nml (sigmoid-Gilbert-nol)  . Head mri, head ct  10/1998    ? H/A focus ([facility])  . Emg/mcv  10/16/01    + mild carpal tunnel  . Dexa  03/12/03    2.2 Spine 0.1 Hip  Osteopenia  . Mri right hip  12/12/01    Tendonitis Gluteus Medius to Gtr Trochanter Neg Bursitis (Dr. Eulas Post)  . L/s films  11/21/01    nml; Right hip nml  . Mri u/s  11/21/01    min. DDD L/3-4  . Colonoscopy  01/04/04    polyps, diverticulosis/severe  . Dexa  11/14/05    1.9 Spine 0.3 Hip  slight improvement  . Flex laryngoscopy  06/11/06    ([facility]) nml  . Dexa  11/22/06    improved osteo and fem neck  . [facility] bunionectomy  11/04/08    Right Great Toe (Dr. Beola Cord)  . Breast mass excision  01/2010    Left-fibrocystic change w/intraductal papilloma, no malignancy  . Ct chest  12/2010    possible bronchiectasis lower lobes, 02/2011 - enlarged bilateral effusions, bibasilar atx  . Stress myoview  02/2011    no significant ischemia  . Ct chest  04/2011    no PE.  abnormal R hilar and mediastinal adenopathy --> on rpt 05/2011, resolved  . US echocardiography  04/2011    mildly dilated LV, EF 25-30%, hypokinesis, restrictive physiology, mod MR, no AS  . Icd placement  07/14/11    w/pacemaker  . Tonsillectomy and adenoidectomy    . Breast biopsy      "I've had 2-3 bx; all benign"  . Augmentation mammaplasty    . 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 Abdominal hysterectomy  1987    w/BSO  . Lasik      left eye  . Knee surgery      left  . Mandible surgery    . Sinus  surgery      x3 with balloon   . Dexa  01/2013    T score -2.6 at spine  . Partial colectomy N/A 10/02/2013    Procedure: PARTIAL COLECTOMY;  Surgeon: Imogene Burn. Georgette Dover, MD;  Location: [place];  Service: General;  Laterality: N/A;  . Colostomy N/A 10/02/2013    Procedure: DESCENDING COLOSTOMY;  Surgeon: Imogene Burn. Georgette Dover, MD;  Location: [place];  Service: General;  Laterality: N/A;  . Colostomy reversal  02/10/2014    dr Georgette Dover  . Colostomy reversal N/A 02/10/2014    Procedure: COLOSTOMY REVERSAL;  Surgeon: [name], MD;  Location: [place];  Service: General;  Laterality: N/A;  . Implantable cardioverter defibrillator implant N/A 07/14/2011    Procedure: IMPLANTABLE CARDIOVERTER DEFIBRILLATOR IMPLANT;  Surgeon: Evans Lance, MD;  Location[facility] CATH [facility];  Service: Cardiovascular;  Laterality: N/A;  . Esophagogastroduodenoscopy  09/2014    small HH, irregular Z line, fundic gland polyps with mild gastritis Olevia Perches)     Family History  Problem Relation Age of Onset  . Lung cancer Father     + smoker  . Dementia Mother   .  Osteoporosis Mother     Lumbar spine  . Stroke Mother     x 5 @ 9 YOA  . Arthritis Mother     hands  . Emphysema Mother   . Alcohol abuse Paternal Grandfather   . Hypertension Maternal Grandfather     ?  . Stroke Paternal Grandfather     ?  Marland Kitchen Heart disease Paternal Grandmother   . Heart disease Paternal Grandfather   . Colon polyps Father   . Colon cancer Neg Hx   . Esophageal cancer Neg Hx   . Gallbladder disease Neg Hx      History   Social History  . Marital Status: Married    Spouse Name: N/A  . Number of Children: 1  . Years of Education: N/A   Occupational History  . 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 [employer]    Social History Main Topics  . Smoking status: Never Smoker   . Smokeless tobacco: Never Used     Comment: Lived with smokers x 23 yrs, smoked herself "for a week"  . Alcohol Use: No  . Drug Use: No  . Sexual  Activity: Not Currently   Other Topics Concern  . Not on file   Social History Narrative   1 adopted child      Lives with husband              BP 116/70 mmHg  Pulse 90  Ht 5' 5.5" (1.664 m)  Wt 171 lb 3.2 oz (77.656 kg)  BMI 28.05 kg/m2  Physical Exam:  Chronically ill appearing 69 yo woman, NAD HEENT: Unremarkable Neck:  6 cm JVD, no thyromegally Back:  No CVA tenderness Lungs:  Clear with no wheezes, rales, or rhonchi. HEART:  Regular rate rhythm, no murmurs, no rubs, no clicks Abd:  soft, positive bowel sounds, no organomegally, no rebound, no guarding, colostomy in place Ext:  2 plus pulses, no edema, no cyanosis, no clubbing Skin:  No rashes no nodules Neuro:  CN II through XII intact, motor grossly intact  DEVICE  Normal device function.  See PaceArt for details.   Assess/Plan:

## 2014-11-12 NOTE — Assessment & Plan Note (Signed)
Her medtronic single chamber device is working normally. Will recheck in several months.

## 2014-11-12 NOTE — Patient Instructions (Signed)
Medication Instructions:  Your physician recommends that you continue on your current medications as directed. Please refer to the Current Medication list given to you today.   Labwork: None ordered  Testing/Procedures: None ordered  Follow-Up: Your physician wants you to follow-up in: 12 months with Dr Taylor You will receive a reminder letter in the mail two months in advance. If you don't receive a letter, please call our office to schedule the follow-up appointment.  Remote monitoring is used to monitor your Pacemaker or ICD from home. This monitoring reduces the number of office visits required to check your device to one time per year. It allows us to keep an eye on the functioning of your device to ensure it is working properly. You are scheduled for a device check from home on 02/11/15. You may send your transmission at any time that day. If you have a wireless device, the transmission will be sent automatically. After your physician reviews your transmission, you will receive a postcard with your next transmission date.     Any Other Special Instructions Will Be Listed Below (If Applicable).   

## 2014-11-12 NOTE — Assessment & Plan Note (Signed)
Her blood pressure is stable. She will follow up in several months.

## 2014-11-12 NOTE — Assessment & Plan Note (Signed)
Her symptoms are class 2. She will continue her current meds. 

## 2014-11-13 HISTORY — PX: OTHER SURGICAL HISTORY: SHX169

## 2014-11-18 DIAGNOSIS — M722 Plantar fascial fibromatosis: Secondary | ICD-10-CM | POA: Diagnosis not present

## 2014-11-18 DIAGNOSIS — M7742 Metatarsalgia, left foot: Secondary | ICD-10-CM | POA: Diagnosis not present

## 2014-11-20 ENCOUNTER — Encounter: Payer: Self-pay | Admitting: Family Medicine

## 2014-11-20 DIAGNOSIS — M65332 Trigger finger, left middle finger: Secondary | ICD-10-CM | POA: Diagnosis not present

## 2014-11-20 DIAGNOSIS — G5622 Lesion of ulnar nerve, left upper limb: Secondary | ICD-10-CM | POA: Diagnosis not present

## 2014-11-20 DIAGNOSIS — G5621 Lesion of ulnar nerve, right upper limb: Secondary | ICD-10-CM | POA: Diagnosis not present

## 2014-12-18 DIAGNOSIS — M65332 Trigger finger, left middle finger: Secondary | ICD-10-CM | POA: Diagnosis not present

## 2014-12-25 ENCOUNTER — Emergency Department: Payer: Medicare Other

## 2014-12-25 ENCOUNTER — Encounter: Payer: Self-pay | Admitting: Internal Medicine

## 2014-12-25 ENCOUNTER — Observation Stay
Admission: EM | Admit: 2014-12-25 | Discharge: 2014-12-27 | Disposition: A | Discharge status: Home / Self Care | Payer: Medicare Other | Attending: Internal Medicine | Admitting: Internal Medicine

## 2014-12-25 ENCOUNTER — Encounter: Payer: Self-pay | Admitting: Family Medicine

## 2014-12-25 ENCOUNTER — Ambulatory Visit (INDEPENDENT_AMBULATORY_CARE_PROVIDER_SITE_OTHER): Payer: Medicare Other | Admitting: Family Medicine

## 2014-12-25 ENCOUNTER — Other Ambulatory Visit: Payer: Self-pay | Admitting: Family Medicine

## 2014-12-25 VITALS — BP 112/70 | HR 85 | Temp 98.7°F | Wt 185.0 lb

## 2014-12-25 DIAGNOSIS — Z91048 Other nonmedicinal substance allergy status: Secondary | ICD-10-CM | POA: Insufficient documentation

## 2014-12-25 DIAGNOSIS — E780 Pure hypercholesterolemia: Secondary | ICD-10-CM | POA: Insufficient documentation

## 2014-12-25 DIAGNOSIS — E559 Vitamin D deficiency, unspecified: Secondary | ICD-10-CM | POA: Insufficient documentation

## 2014-12-25 DIAGNOSIS — M170 Bilateral primary osteoarthritis of knee: Secondary | ICD-10-CM | POA: Insufficient documentation

## 2014-12-25 DIAGNOSIS — N390 Urinary tract infection, site not specified: Secondary | ICD-10-CM | POA: Diagnosis not present

## 2014-12-25 DIAGNOSIS — Z823 Family history of stroke: Secondary | ICD-10-CM | POA: Diagnosis not present

## 2014-12-25 DIAGNOSIS — Z801 Family history of malignant neoplasm of trachea, bronchus and lung: Secondary | ICD-10-CM | POA: Diagnosis not present

## 2014-12-25 DIAGNOSIS — G43009 Migraine without aura, not intractable, without status migrainosus: Secondary | ICD-10-CM | POA: Diagnosis not present

## 2014-12-25 DIAGNOSIS — Z950 Presence of cardiac pacemaker: Secondary | ICD-10-CM | POA: Insufficient documentation

## 2014-12-25 DIAGNOSIS — E785 Hyperlipidemia, unspecified: Secondary | ICD-10-CM | POA: Insufficient documentation

## 2014-12-25 DIAGNOSIS — Z91040 Latex allergy status: Secondary | ICD-10-CM | POA: Insufficient documentation

## 2014-12-25 DIAGNOSIS — Z825 Family history of asthma and other chronic lower respiratory diseases: Secondary | ICD-10-CM | POA: Insufficient documentation

## 2014-12-25 DIAGNOSIS — Z89422 Acquired absence of other left toe(s): Secondary | ICD-10-CM | POA: Diagnosis not present

## 2014-12-25 DIAGNOSIS — I951 Orthostatic hypotension: Secondary | ICD-10-CM | POA: Insufficient documentation

## 2014-12-25 DIAGNOSIS — R197 Diarrhea, unspecified: Secondary | ICD-10-CM | POA: Insufficient documentation

## 2014-12-25 DIAGNOSIS — I130 Hypertensive heart and chronic kidney disease with heart failure and stage 1 through stage 4 chronic kidney disease, or unspecified chronic kidney disease: Secondary | ICD-10-CM | POA: Diagnosis not present

## 2014-12-25 DIAGNOSIS — Z79899 Other long term (current) drug therapy: Secondary | ICD-10-CM | POA: Insufficient documentation

## 2014-12-25 DIAGNOSIS — J381 Polyp of vocal cord and larynx: Secondary | ICD-10-CM | POA: Diagnosis not present

## 2014-12-25 DIAGNOSIS — M79602 Pain in left arm: Secondary | ICD-10-CM | POA: Diagnosis not present

## 2014-12-25 DIAGNOSIS — K5732 Diverticulitis of large intestine without perforation or abscess without bleeding: Secondary | ICD-10-CM | POA: Insufficient documentation

## 2014-12-25 DIAGNOSIS — R0689 Other abnormalities of breathing: Secondary | ICD-10-CM | POA: Diagnosis not present

## 2014-12-25 DIAGNOSIS — R05 Cough: Secondary | ICD-10-CM | POA: Insufficient documentation

## 2014-12-25 DIAGNOSIS — F419 Anxiety disorder, unspecified: Secondary | ICD-10-CM | POA: Insufficient documentation

## 2014-12-25 DIAGNOSIS — M19041 Primary osteoarthritis, right hand: Secondary | ICD-10-CM | POA: Insufficient documentation

## 2014-12-25 DIAGNOSIS — I420 Dilated cardiomyopathy: Secondary | ICD-10-CM | POA: Insufficient documentation

## 2014-12-25 DIAGNOSIS — Z811 Family history of alcohol abuse and dependence: Secondary | ICD-10-CM | POA: Insufficient documentation

## 2014-12-25 DIAGNOSIS — R5383 Other fatigue: Secondary | ICD-10-CM | POA: Diagnosis not present

## 2014-12-25 DIAGNOSIS — R079 Chest pain, unspecified: Secondary | ICD-10-CM | POA: Diagnosis not present

## 2014-12-25 DIAGNOSIS — Z887 Allergy status to serum and vaccine status: Secondary | ICD-10-CM | POA: Insufficient documentation

## 2014-12-25 DIAGNOSIS — Z885 Allergy status to narcotic agent status: Secondary | ICD-10-CM | POA: Diagnosis not present

## 2014-12-25 DIAGNOSIS — R51 Headache: Secondary | ICD-10-CM

## 2014-12-25 DIAGNOSIS — R0789 Other chest pain: Secondary | ICD-10-CM | POA: Diagnosis not present

## 2014-12-25 DIAGNOSIS — N183 Chronic kidney disease, stage 3 (moderate): Secondary | ICD-10-CM | POA: Insufficient documentation

## 2014-12-25 DIAGNOSIS — Z8249 Family history of ischemic heart disease and other diseases of the circulatory system: Secondary | ICD-10-CM | POA: Insufficient documentation

## 2014-12-25 DIAGNOSIS — R0989 Other specified symptoms and signs involving the circulatory and respiratory systems: Secondary | ICD-10-CM | POA: Diagnosis not present

## 2014-12-25 DIAGNOSIS — N6019 Diffuse cystic mastopathy of unspecified breast: Secondary | ICD-10-CM | POA: Diagnosis not present

## 2014-12-25 DIAGNOSIS — Z880 Allergy status to penicillin: Secondary | ICD-10-CM | POA: Diagnosis not present

## 2014-12-25 DIAGNOSIS — F329 Major depressive disorder, single episode, unspecified: Secondary | ICD-10-CM | POA: Diagnosis not present

## 2014-12-25 DIAGNOSIS — R0609 Other forms of dyspnea: Secondary | ICD-10-CM

## 2014-12-25 DIAGNOSIS — I5022 Chronic systolic (congestive) heart failure: Secondary | ICD-10-CM | POA: Insufficient documentation

## 2014-12-25 DIAGNOSIS — R55 Syncope and collapse: Secondary | ICD-10-CM | POA: Diagnosis not present

## 2014-12-25 DIAGNOSIS — R040 Epistaxis: Secondary | ICD-10-CM | POA: Diagnosis not present

## 2014-12-25 DIAGNOSIS — Z95810 Presence of automatic (implantable) cardiac defibrillator: Secondary | ICD-10-CM | POA: Diagnosis not present

## 2014-12-25 DIAGNOSIS — Z8371 Family history of colonic polyps: Secondary | ICD-10-CM | POA: Diagnosis not present

## 2014-12-25 DIAGNOSIS — Z7982 Long term (current) use of aspirin: Secondary | ICD-10-CM | POA: Diagnosis not present

## 2014-12-25 DIAGNOSIS — G4730 Sleep apnea, unspecified: Secondary | ICD-10-CM | POA: Insufficient documentation

## 2014-12-25 DIAGNOSIS — K589 Irritable bowel syndrome without diarrhea: Secondary | ICD-10-CM | POA: Diagnosis not present

## 2014-12-25 DIAGNOSIS — M19042 Primary osteoarthritis, left hand: Secondary | ICD-10-CM | POA: Diagnosis not present

## 2014-12-25 DIAGNOSIS — R0902 Hypoxemia: Secondary | ICD-10-CM | POA: Diagnosis not present

## 2014-12-25 DIAGNOSIS — R0600 Dyspnea, unspecified: Secondary | ICD-10-CM

## 2014-12-25 DIAGNOSIS — Z8489 Family history of other specified conditions: Secondary | ICD-10-CM | POA: Diagnosis not present

## 2014-12-25 DIAGNOSIS — K219 Gastro-esophageal reflux disease without esophagitis: Secondary | ICD-10-CM | POA: Insufficient documentation

## 2014-12-25 DIAGNOSIS — I340 Nonrheumatic mitral (valve) insufficiency: Secondary | ICD-10-CM | POA: Insufficient documentation

## 2014-12-25 DIAGNOSIS — Z8262 Family history of osteoporosis: Secondary | ICD-10-CM | POA: Insufficient documentation

## 2014-12-25 DIAGNOSIS — M479 Spondylosis, unspecified: Secondary | ICD-10-CM | POA: Insufficient documentation

## 2014-12-25 DIAGNOSIS — J209 Acute bronchitis, unspecified: Secondary | ICD-10-CM | POA: Diagnosis not present

## 2014-12-25 DIAGNOSIS — R0602 Shortness of breath: Secondary | ICD-10-CM | POA: Diagnosis not present

## 2014-12-25 DIAGNOSIS — Z8701 Personal history of pneumonia (recurrent): Secondary | ICD-10-CM | POA: Diagnosis not present

## 2014-12-25 DIAGNOSIS — R519 Headache, unspecified: Secondary | ICD-10-CM

## 2014-12-25 LAB — COMPREHENSIVE METABOLIC PANEL
ALT: 23 U/L (ref 14–54)
AST: 32 U/L (ref 15–41)
Albumin: 4.3 g/dL (ref 3.5–5.0)
Alkaline Phosphatase: 118 U/L (ref 38–126)
Anion gap: 10 (ref 5–15)
BUN: 18 mg/dL (ref 6–20)
CO2: 25 mmol/L (ref 22–32)
Calcium: 9.8 mg/dL (ref 8.9–10.3)
Chloride: 105 mmol/L (ref 101–111)
Creatinine, Ser: 1.05 mg/dL — ABNORMAL HIGH (ref 0.44–1.00)
GFR calc Af Amer: >60 mL/min (ref >60)
GFR calc non Af Amer: 53 mL/min — ABNORMAL LOW (ref >60)
Glucose, Bld: 100 mg/dL — ABNORMAL HIGH (ref 65–99)
Potassium: 4.1 mmol/L (ref 3.5–5.1)
Sodium: 140 mmol/L (ref 135–145)
Total Bilirubin: 0.4 mg/dL (ref 0.3–1.2)
Total Protein: 8.2 g/dL — ABNORMAL HIGH (ref 6.5–8.1)

## 2014-12-25 LAB — BLOOD GAS, ARTERIAL
Acid-Base Excess: 0.7 mmol/L (ref 0.0–3.0)
Bicarbonate: 25.4 mEq/L (ref 21.0–28.0)
FIO2: 0.21
O2 Saturation: 95.5 %
Patient temperature: 37
pCO2 arterial: 40 mmHg (ref 32.0–48.0)
pH, Arterial: 7.41 (ref 7.350–7.450)
pO2, Arterial: 78 mmHg — ABNORMAL LOW (ref 83.0–108.0)

## 2014-12-25 LAB — CBC WITH DIFFERENTIAL/PLATELET
Basophils Absolute: 0.1 10*3/uL (ref 0–0.1)
Basophils Relative: 1 %
Eosinophils Absolute: 0.2 10*3/uL (ref 0–0.7)
Eosinophils Relative: 1 %
HCT: 40.1 % (ref 35.0–47.0)
Hemoglobin: 13.2 g/dL (ref 12.0–16.0)
Lymphocytes Relative: 23 %
Lymphs Abs: 2.8 10*3/uL (ref 1.0–3.6)
MCH: 29.4 pg (ref 26.0–34.0)
MCHC: 32.9 g/dL (ref 32.0–36.0)
MCV: 89.4 fL (ref 80.0–100.0)
Monocytes Absolute: 1.1 10*3/uL — ABNORMAL HIGH (ref 0.2–0.9)
Monocytes Relative: 9 %
Neutro Abs: 8.1 10*3/uL — ABNORMAL HIGH (ref 1.4–6.5)
Neutrophils Relative %: 66 %
Platelets: 242 10*3/uL (ref 150–440)
RBC: 4.48 MIL/uL (ref 3.80–5.20)
RDW: 13.8 % (ref 11.5–14.5)
WBC: 12.2 10*3/uL — ABNORMAL HIGH (ref 3.6–11.0)

## 2014-12-25 LAB — BRAIN NATRIURETIC PEPTIDE: B Natriuretic Peptide: 18 pg/mL (ref 0.0–100.0)

## 2014-12-25 LAB — TROPONIN I: Troponin I: <0.03 ng/mL (ref <0.031)

## 2014-12-25 MED ORDER — ONDANSETRON HCL 4 MG PO TABS: 4.0000 mg | ORAL_TABLET | Freq: Four times a day (QID) | ORAL | Status: DC | PRN

## 2014-12-25 MED ORDER — MIDAZOLAM HCL 5 MG/5ML IJ SOLN: 2.0000 mg | Freq: Once | INTRAMUSCULAR | Status: DC

## 2014-12-25 MED ORDER — ONDANSETRON HCL 4 MG/2ML IJ SOLN: 4.0000 mg | Freq: Four times a day (QID) | INTRAMUSCULAR | Status: DC | PRN

## 2014-12-25 MED ORDER — ACETAMINOPHEN 325 MG PO TABS
650.0000 mg | ORAL_TABLET | Freq: Four times a day (QID) | ORAL | Status: DC | PRN
  Administered 2014-12-26 – 2014-12-27 (×3): 325 mg via ORAL
  Filled 2014-12-25: qty 1
  Filled 2014-12-25: qty 2
  Filled 2014-12-25: qty 1

## 2014-12-25 MED ORDER — HEPARIN SODIUM (PORCINE) 5000 UNIT/ML IJ SOLN
5000.0000 [IU] | Freq: Three times a day (TID) | INTRAMUSCULAR | Status: DC
  Filled 2014-12-25 (×2): qty 1

## 2014-12-25 MED ORDER — GUAIFENESIN-CODEINE 100-10 MG/5ML PO SOLN
5.0000 mL | ORAL | Status: DC | PRN
Start: 2014-12-25 — End: 2014-12-27
  Administered 2014-12-26 (×3): 5 mL via ORAL
  Filled 2014-12-25 (×3): qty 5

## 2014-12-25 MED ORDER — ACETAMINOPHEN 500 MG PO TABS
ORAL_TABLET | ORAL | Status: AC
Start: 2014-12-25 — End: 2014-12-26
  Filled 2014-12-25: qty 2

## 2014-12-25 MED ORDER — ACETAMINOPHEN 650 MG RE SUPP: 650.0000 mg | Freq: Four times a day (QID) | RECTAL | Status: DC | PRN

## 2014-12-25 MED ORDER — ACETAMINOPHEN 500 MG PO TABS
1000.0000 mg | ORAL_TABLET | Freq: Once | ORAL | Status: AC
  Administered 2014-12-25: 500 mg via ORAL

## 2014-12-25 MED ORDER — METHYLPREDNISOLONE SODIUM SUCC 125 MG IJ SOLR
60.0000 mg | INTRAMUSCULAR | Status: DC
  Administered 2014-12-26: 23:00:00 60 mg via INTRAVENOUS
  Administered 2014-12-26: 125 mg via INTRAVENOUS
  Filled 2014-12-25 (×2): qty 2

## 2014-12-25 MED ORDER — LEVOFLOXACIN IN D5W 750 MG/150ML IV SOLN
750.0000 mg | Freq: Once | INTRAVENOUS | Status: AC
  Administered 2014-12-25: 750 mg via INTRAVENOUS
  Filled 2014-12-25: qty 150

## 2014-12-25 MED ORDER — IPRATROPIUM-ALBUTEROL 0.5-2.5 (3) MG/3ML IN SOLN: 3.0000 mL | RESPIRATORY_TRACT | Status: DC | PRN

## 2014-12-25 NOTE — Assessment & Plan Note (Addendum)
Patient with new type of headache over the past month. Notes this is sharp in nature lasting briefly. Seems most consistent with trigeminal neuralgia, though given new type of HA in patient >69 yo must consider other causes. She is neurologically intact at this time. She has minimal soreness that she describes as like having a bruise in bilateral temples. Could be tension type headache. Patient is being transported to the ED for further evaluation of shortness of breath at this time. Will defer immediate work up to them with consideration of obtaining an ESR and CT head to further evaluate this issue.

## 2014-12-25 NOTE — Progress Notes (Signed)
Pre visit review using our clinic review tool, if applicable. No additional management support is needed unless otherwise documented below in the visit note. 

## 2014-12-25 NOTE — ED Provider Notes (Signed)
[facility] Emergency Department Provider Note ___________________________________________  Time seen: Approximately 4:57 PM  I have reviewed the triage vital signs and the nursing notes.   HISTORY  Chief Complaint Cough and Chest Pain  HPI Ashley [name] is a 69 y.o. female is complaining that she started coughing really bad this morning and has persisted throughout the day. The patient states that she felt the cough coming on for the past 2-3 days. Patient went to her pulmonologist today because she thought she may have pneumonia and when she continued to cough in the office her oxygen saturation is Dropping especially when she walked. When they walked her around the office her sats dropped to 88%. Patient states that she also has history of congestive heart failure and she has gained 13-14 pounds over the past 2 months but it was not in acute weight gain it's just been steady. Patient has not been taking her torsemide because she states that it was causing her to get too dried out and causing chronic kidney disease. Patient states that she is not having any significant pain other than the fact that every time she coughs it hurts to cough and she thinks that she's having some headache and cramping over her belly just because of the coughing. Patient states they all seem to be related. She denies any significant fever, chills, nausea, vomiting, change in her bowels, dizziness, or weakness. Patient was stating that she just wanted some Tylenol for her headache it's occurring with cough and she wanted to be evaluated to make sure that she did not have a pneumonia versus CHF. Patient's oxygen saturations in the ER 100% on room air.  Past Medical History  Diagnosis Date  . Migraine without aura, without mention of intractable migraine without mention of status migrainosus   . History of diverticulitis of colon   . Pure hypercholesterolemia   . Irritable bowel syndrome    . Laryngeal nodule     Dr. Thomasena Edis  . Fibrocystic breast disease   . Hemorrhoids   . Anxiety   . GERD (gastroesophageal reflux disease)   . Systolic CHF     EF 16-10%, LV dilation, significant MR, goal weight 150lbs  . Nonischemic cardiomyopathy   . History of pneumonia 2012  . Mitral regurgitation   . Anxiety   . Bronchiectasis     possibly mild, treat URIs aggressively  . Hypertension   . Heart murmur   . Angina   . Lung disease 07/14/11  . Shortness of breath     "resting"  . Anemia   . Fibromyalgia   . Depression   . Disorder of vocal cords   . Chronic sinusitis   . Insomnia   . Osteoporosis 01/2013  . sigmoid diverticulitis with perforation, abscess and fistula 09/30/2013  . H/O multiple allergies 10/10/2013  . PONV (postoperative nausea and vomiting)   . CHF (congestive heart failure) 2011  . Pneumonia 2011  . Pacemaker   . Automatic implantable cardioverter-defibrillator in situ 2012  . Osteoarthritis     knees, back, hands, low back,   . Cancer     basal cell, arms & neck  . Sleep apnea 2010    borderline, inconclusive- /w [place]   . Hemorrhoids   . Diarrhea 07/14/2008    Qualifier: Diagnosis of  By: Glori Bickers MD, Carmell Austria   . Allergy     Patient Active Problem List   Diagnosis Date Noted  . Chest pain and shortness  of breath 12/25/2014  . Diarrhea 10/28/2014  . UTI (urinary tract infection) 05/29/2014  . S/P colostomy takedown 02/10/2014  . ICD (implantable cardioverter-defibrillator) in place 10/23/2013  . H/O multiple allergies 10/10/2013  . Sigmoid diverticulitis with abscess, perforation and fistula formation 09/25/2013  . Nail disorder 10/22/2012  . Near syncope 09/13/2012  . Irregular heart beat 08/06/2012  . Depression 07/09/2012  . Headache 07/09/2012  . Chronic recurrent sinusitis 06/17/2012  . Vitamin D deficiency 03/05/2012  . CKD (chronic kidney disease) stage 3, GFR 30-59 ml/min 11/09/2011  . Epistaxis, recurrent 10/11/2011  . Left arm  pain 09/14/2011  . Orthostatic hypotension 04/30/2011  . Cardiomyopathy, dilated, nonischemic 03/01/2011  . Mitral regurgitation 02/24/2011  . Chronic systolic CHF (congestive heart failure) 02/24/2011  . Cough 01/20/2011  . Fatigue 11/02/2010  . Anxiety state 08/18/2010  . 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  . HYPERCHOLESTEROLEMIA, PURE 11/22/2006  . COMMON MIGRAINE 11/22/2006  . DIVERTICULOSIS, COLON 11/22/2006  . Irritable bowel syndrome 11/22/2006  . FIBROCYSTIC BREAST DISEASE 11/22/2006  . Osteoarthritis 11/22/2006  . Fibromyalgia 11/22/2006    Past Surgical History  Procedure Laterality Date  . Appendectomy    . Mandible surgery      "got about 6 pins in the bottom of my jaw"  . Knee arthroscopy w/ orif      Left; "meniscus tear"  . Osteotomy      Left foot  . 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 Dexa  03/28/99    osteopenia L/S  . Colonoscopy  1998    nml (sigmoid-Gilbert-nol)  . Head mri, head ct  10/1998    ? H/A focus ([place])  . Emg/mcv  10/16/01    + mild carpal tunnel  . Dexa  03/12/03    2.2 Spine 0.1 Hip  Osteopenia  . Mri right hip  12/12/01    Tendonitis Gluteus Medius to Gtr Trochanter Neg Bursitis (Dr. Eulas Post)  . L/s films  11/21/01    nml; Right hip nml  . Mri u/s  11/21/01    min. DDD L/3-4  . Colonoscopy  01/04/04    polyps, diverticulosis/severe  . Dexa  11/14/05    1.9 Spine 0.3 Hip  slight improvement  . Flex laryngoscopy  06/11/06    ([place]) nml  . Dexa  11/22/06    improved osteo and fem neck  . [place] bunionectomy  11/04/08    Right Great Toe (Dr. Beola Cord)  . Breast mass excision  01/2010    Left-fibrocystic change w/intraductal papilloma, no malignancy  . Ct chest  12/2010    possible bronchiectasis lower lobes, 02/2011 - enlarged bilateral effusions, bibasilar atx  . Stress myoview  02/2011    no significant ischemia  . Ct chest  04/2011    no PE.  abnormal R hilar and mediastinal adenopathy --> on rpt 05/2011, resolved  . US  echocardiography  04/2011    mildly dilated LV, EF 25-30%, hypokinesis, restrictive physiology, mod MR, no AS  . Icd placement  07/14/11    w/pacemaker  . Tonsillectomy and adenoidectomy    . Breast biopsy      "I've had 2-3 bx; all benign"  . Augmentation mammaplasty    . 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 Abdominal hysterectomy  1987    w/BSO  . Lasik      left eye  . Knee  surgery      left  . Mandible surgery    . Sinus surgery      x3 with balloon   . Dexa  01/2013    T score -2.6 at spine  . Partial colectomy N/A 10/02/2013    Procedure: PARTIAL COLECTOMY;  Surgeon: Imogene Burn. Georgette Dover, MD;  Location: [place];  Service: General;  Laterality: N/A;  . Colostomy N/A 10/02/2013    Procedure: DESCENDING COLOSTOMY;  Surgeon: Imogene Burn. Georgette Dover, MD;  Location: [place];  Service: General;  Laterality: N/A;  . Colostomy reversal  02/10/2014    dr Georgette Dover  . Colostomy reversal N/A 02/10/2014    Procedure: COLOSTOMY REVERSAL;  Surgeon: [name], MD;  Location: [place];  Service: General;  Laterality: N/A;  . Implantable cardioverter defibrillator implant N/A 07/14/2011    Procedure: IMPLANTABLE CARDIOVERTER DEFIBRILLATOR IMPLANT;  Surgeon: Evans Lance, MD;  Location: [facility] CATH LAB;  Service: Cardiovascular;  Laterality: N/A;  . Esophagogastroduodenoscopy  09/2014    small HH, irregular Z line, fundic gland polyps with mild gastritis ([place])  . Ncs  11/2014    L ulnar neuropathy, mild B median nerve entrapment Nelva Bush)    Current Outpatient Rx  Name  Route  Sig  Dispense  Refill  . acetaminophen (TYLENOL) 325 MG tablet   Oral   Take 325 mg by mouth 2 (two) times daily as needed for moderate pain.         Marland Kitchen alendronate (FOSAMAX) 70 MG tablet   Oral   Take 70 mg by mouth every Thursday. Take with a full glass of water on an empty stomach.         . ALPRAZolam (XANAX) 0.25 MG tablet      TAKE 1 TABLET BY MOUTH 2 TIMES A DAY AS NEEDED FOR  ANXIETY   60 tablet   0     Not to exceed 5 additional fills before 04/26/2015   . Ascorbic Acid (VITAMIN C) 1000 MG tablet   Oral   Take 1,000 mg by mouth daily.         Marland Kitchen aspirin EC 81 MG tablet   Oral   Take 81 mg by mouth at bedtime.          Marland Kitchen atorvastatin (LIPITOR) 40 MG tablet   Oral   Take 1 tablet (40 mg total) by mouth every other day.   45 tablet   2   . B Complex Vitamins (VITAMIN B COMPLEX PO)   Oral   Take 1 capsule by mouth daily.         . Calcium Carbonate-Vitamin D (CALCIUM + D PO)   Oral   Take 1 tablet by mouth daily.         . carvedilol (COREG) 12.5 MG tablet   Oral   Take 1 tablet (12.5 mg total) by mouth 2 (two) times daily with a meal.   180 tablet   3   . IRON PO   Oral   Take 1 capsule by mouth every other day.         Marland Kitchen omeprazole (PRILOSEC) 40 MG capsule   Oral   Take 1 capsule (40 mg total) by mouth 2 (two) times daily before a meal.   180 capsule   1   . polyethylene glycol (MIRALAX / GLYCOLAX) packet   Oral   Take 9 g by mouth daily.         Marland Kitchen PRESCRIPTION MEDICATION   Both  Ears   Place 1 application into both ears daily as needed (MED NAME: mometasone 0.1% SOLUTION (for eczema on ears)).         . Probiotic Product (PROBIOTIC DAILY) CAPS   Oral   Take 1 capsule by mouth daily.         . Pyridoxine HCl (VITAMIN B6 PO)   Oral   Take 1 capsule by mouth daily.         . sertraline (ZOLOFT) 25 MG tablet   Oral   Take 1 tablet (25 mg total) by mouth daily.   30 tablet   6   . spironolactone (ALDACTONE) 25 MG tablet   Oral   Take 25 mg by mouth every morning.         . triamcinolone (NASACORT) 55 MCG/ACT AERO nasal inhaler   Nasal   Place 2 sprays into the nose 2 (two) times daily.         . vitamin E 100 UNIT capsule   Oral   Take 100 Units by mouth daily.         . Wheat Dextrin (BENEFIBER DRINK MIX PO)   Oral   Take 1 packet by mouth daily. Pt takes [facility] Natural Fiber         .  Coenzyme Q10 (CO Q 10 PO)   Oral   Take 1 tablet by mouth daily.         Marland Kitchen dextromethorphan (DELSYM) 30 MG/5ML liquid   Oral   Take 30 mg by mouth daily as needed for cough.            Allergies Antihistamines, chlorpheniramine-type; Codeine; Cyclobenzaprine; Cymbalta; Influenza vac split; Penicillins; Sulfasalazine; Atorvastatin; Chlorhexidine; Dilaudid; Latex; Morphine and related; Pravastatin; Prevacid; Red dye; Rosuvastatin; Simvastatin; Other; Tape; and Metaxalone  Family History  Problem Relation Age of Onset  . Lung cancer Father     + smoker  . Dementia Mother   . Osteoporosis Mother     Lumbar spine  . Stroke Mother     x 5 @ 68 YOA  . Arthritis Mother     hands  . Emphysema Mother   . Alcohol abuse Paternal Grandfather   . Hypertension Maternal Grandfather     ?  . Stroke Paternal Grandfather     ?  Marland Kitchen Heart disease Paternal Grandmother   . Heart disease Paternal Grandfather   . Colon polyps Father   . Colon cancer Neg Hx   . Esophageal cancer Neg Hx   . Gallbladder disease Neg Hx     Social History Social History  Substance Use Topics  . Smoking status: Never Smoker   . Smokeless tobacco: Never Used     Comment: Lived with smokers x 23 yrs, smoked herself "for a week"  . Alcohol Use: No    Review of Systems Constitutional: No fever/chills Eyes: No visual changes. Patient also complaining of headache with her coughing. ENT: No sore throat. Cardiovascular: Patient only with chest pain to the anterior chest with coughing. Respiratory: Patient with deep, dry, nonproductive cough that has progressively worsened over the past couple of days. Patient also with shortness of breath on exertion and hypoxia at the pulmonologists office. Gastrointestinal: No abdominal pain.  No nausea, no vomiting.  No diarrhea.  No constipation. Genitourinary: Negative for dysuria. Musculoskeletal: Negative for back pain. Patient only with mild edema on her legs. Skin:  Negative for rash. Neurological: Negative for headaches, focal weakness or numbness. 10-point ROS otherwise negative.  ____________________________________________   PHYSICAL EXAM:  VITAL SIGNS: ED Triage Vitals  Enc Vitals Group     BP 12/25/14 1642 143/79 mmHg     Pulse Rate 12/25/14 1642 79     Resp 12/25/14 1642 22     Temp 12/25/14 1642 98.1 F (36.7 C)     Temp Source 12/25/14 1642 Oral     SpO2 12/25/14 1642 98 %     Weight 12/25/14 1642 174 lb (78.926 kg)     Height 12/25/14 1642 5\' 5"  (1.651 m)     Head Cir --      Peak Flow --      Pain Score --      Pain Loc --      Pain Edu? --      Excl. in [place]? --     Constitutional: Alert and oriented. Well appearing and in moderate distress secondary to her frequent deep cough. Eyes: Conjunctivae are normal. PERRL. EOMI. Head: Atraumatic. Nose: No congestion/rhinnorhea. Mouth/Throat: Mucous membranes are moist.  Oropharynx non-erythematous. Neck: No stridor.   Cardiovascular: Normal rate, regular rhythm. Grossly normal heart sounds.  Good peripheral circulation. Patient with mild tenderness across her left anterior chest. Respiratory: Patient is mildly tachypneic.  Patient with mild retractions.. Lungs CTAB with an occasional rhonchi in the left lung.. Gastrointestinal: Soft and nontender. No distention. No abdominal bruits. No CVA tenderness. Musculoskeletal: No lower extremity tenderness nor edema.  No joint effusions. Neurologic:  Normal speech and language. No gross focal neurologic deficits are appreciated. No gait instability. Skin:  Skin is warm, dry and intact. No rash noted. Psychiatric: Mood and affect are normal. Speech and behavior are normal.  ____________________________________________   LABS (all labs ordered are listed, but only abnormal results are displayed)  Labs Reviewed  CBC WITH DIFFERENTIAL/PLATELET - Abnormal; Notable for the following:    WBC 12.2 (*)    Neutro Abs 8.1 (*)    Monocytes  Absolute 1.1 (*)    All other components within normal limits  BLOOD GAS, ARTERIAL - Abnormal; Notable for the following:    pO2, Arterial 78 (*)    All other components within normal limits  COMPREHENSIVE METABOLIC PANEL - Abnormal; Notable for the following:    Glucose, Bld 100 (*)    Creatinine, Ser 1.05 (*)    Total Protein 8.2 (*)    GFR calc non Af Amer 53 (*)    All other components within normal limits  BRAIN NATRIURETIC PEPTIDE  TROPONIN I   ____________________________________________  EKG  EKG was done at the doctor's office. There were no acute findings per the doctor's office on her EKG. ____________________________________________  RADIOLOGY  Dg Chest 2 View  12/25/2014   CLINICAL DATA:  Cough.  Shortness of breath.  Pneumonia symptoms.  EXAM: CHEST - 2 VIEW  COMPARISON:  None.  FINDINGS: The heart size is normal. Emphysematous changes are again noted. No focal airspace disease is present. An AICD wire is in place.  IMPRESSION: Emphysema.  No acute could pulmonary disease or significant interval change.   Electronically Signed   By: San Morelle M.D.   On: 12/25/2014 19:10    ____________________________________________   PROCEDURES  Procedure(s) performed: None  Critical Care performed: No  ____________________________________________   INITIAL IMPRESSION / ASSESSMENT AND PLAN / ED COURSE  Pertinent labs & imaging results that were available during my care of the patient were reviewed by me and considered in my medical decision making (see chart for details).  -----------------------------------------  5:33 PM on 12/25/2014 ----------------------------------------- Patient was given some IV Levaquin while awaiting labs and chest x-ray. Patient also has been getting ABG to evaluate her oxygen level. ----------------------------------------- 9:23 PM on 12/25/2014 ----------------------------------------- Patient's PO2 was actually 78 on room air.  Patient was given some IV Levaquin in the ER and actually maintaining her saturations above 95%. Patient's chest x-ray was negative for acute pneumonia as well as her BNP was only 18. I felt like patient does have an extensive bronchitis and patient stated that she did well on Levaquin in the past. Patient was given a dose of IV Levaquin in the ER and we are going to get her up and walk her and see how her saturations maintained prior to discharge.  ----------------------------------------- 10:44 PM on 12/25/2014 ----------------------------------------- Minimally actually brought the patient to see if she would desat, patient actually desaturate lower than 88% and felt very tired with exertion and felt like she was going up and about on her breathing. We decided to go ahead and call the hospitalist and have the patient admitted.  ____________________________________________   FINAL CLINICAL IMPRESSION(S) / ED DIAGNOSES  Final diagnoses:  Acute bronchitis, unspecified organism  Hypoxemia  Dyspnea on exertion      [name] Cola, MD 12/25/14 2246

## 2014-12-25 NOTE — Telephone Encounter (Signed)
plz phone in. 

## 2014-12-25 NOTE — Telephone Encounter (Signed)
Rx called in as prescribed 

## 2014-12-25 NOTE — Assessment & Plan Note (Addendum)
Patient comes in with complaint of chest pain and shortness of breath and found to have O2 sat of 89% on ambulation. Most likely cause for this is her CHF given that her weight is up 14 lbs from last check, though could be related to infectious cause (less likely given afebrile and normal lung exam), ischemic cause (less likely given stable EKG), or PE (less likely given normal pulse rate). Patient will be transferred to the ED via EMS for further evaluation and management given patient with problem that poses threat to life.

## 2014-12-25 NOTE — Progress Notes (Signed)
Patient ID: Ashley [name], female   DOB: [birth year], 69 y.o.   MRN: [id number]  Ashley Rumps, MD Phone: [phone]  Ashley [name] is a 69 y.o. female who presents today for same day appointment.   Patient comes in today stating that she has chest tightness and pressure. She thinks this is related to PNA. Notes she is short of breath as well. Feels like she gets increasingly short of breath if she walks. Notes sore throat as well. States she was exposed to PNA 2 weeks ago and her husband has been sick. She notes she is blowing her nose and eyes are watering. States she has a history of PNA. Has a pace maker/ICD in place. Denies palpitations. No history of MI. Denies edema. Notes PND as well. Does not lay flat at night.   Headache: patient notes new headaches over the past month in the frontal and bitemporal region that is sharp in nature. Initially it lasted 1-2 seconds at a time. Now the pain lasts 8-15 seconds at a time. Notes her vision has been better than usual with this. She denies sinus pressure and states she does not have her frontal sinuses. Denies numbness and weakness. Headaches do not wake her up from sleep.   PMH: nonsmoker. History of cardiomyopathy.   ROS see HPI  Objective   Physical Exam Filed Vitals:   12/25/14 1456  BP: 112/70  Pulse: 85  Temp: 98.7 F (37.1 C)    BP Readings from Last 3 Encounters:  12/25/14 138/73  12/25/14 112/70  11/12/14 116/70   Wt Readings from Last 3 Encounters:  12/25/14 174 lb (78.926 kg)  12/25/14 185 lb (83.915 kg)  11/12/14 171 lb 3.2 oz (77.656 kg)    Physical Exam  Constitutional: She is well-developed, well-nourished, and in no distress. No distress.  HENT:  Head: Normocephalic and atraumatic.  Mild OP erythema, normal TMs bilaterally, anterior cervical chain with mild tenderness though no LAD, full neck ROM, mild bitemporal scalp soreness (patient states this feels like a bruise)  Eyes: Conjunctivae and EOM  are normal. Pupils are equal, round, and reactive to light.  Neck: Normal range of motion. Neck supple.  Cardiovascular: Normal rate, regular rhythm and normal heart sounds.   Pulmonary/Chest: Effort normal and breath sounds normal. No respiratory distress. She has no wheezes. She has no rales.  Musculoskeletal: She exhibits no edema.  Lymphadenopathy:    She has no cervical adenopathy.  Neurological: She is alert.  CN 2-12 intact, 5/5 strength in bilateral biceps, triceps, grip, quads, hamstrings, plantar and dorsiflexion, sensation to light touch intact in bilateral UE and LE, normal gait, 2+ patellar and brachioradialis reflexes  Skin: Skin is warm and dry. She is not diaphoretic.     Assessment/Plan: Please see individual problem list.  Chest pain and shortness of breath Patient comes in with complaint of chest pain and shortness of breath and found to have O2 sat of 89% on ambulation. Most likely cause for this is her CHF given that her weight is up 14 lbs from last check, though could be related to infectious cause (less likely given afebrile and normal lung exam), ischemic cause (less likely given stable EKG), or PE (less likely given normal pulse rate). Patient will be transferred to the ED for further evaluation and management.   Headache Patient with new type of headache over the past month. Notes this is sharp in nature lasting briefly. Seems most consistent with trigeminal neuralgia, though given new type  of HA in patient >68 yo must consider other causes. She is neurologically intact at this time. She has minimal soreness that she describes as like having a bruise in bilateral temples. Could be tension type headache. Patient is being transported to the ED for further evaluation of shortness of breath at this time. Will defer immediate work up to them with consideration of obtaining an ESR and CT head to further evaluate this issue.     Orders Placed This Encounter  Procedures  .  CT Head Wo Contrast    Standing Status: Future     Number of Occurrences:      Standing Expiration Date: 03/26/2016    Order Specific Question:  Reason for Exam (SYMPTOM  OR DIAGNOSIS REQUIRED)    Answer:  headache    Order Specific Question:  Preferred imaging location?    Answer:  [facility]  . EKG 12-Lead    Patient with chest pain and shortness of breath with O2 desat to 89% on ambulation. Also with headache and bitemporal mild soreness. Patient needs further work up in the ED for chest pain and shortness of breath as well as new headaches. Would benefit from CT scan head and sed rate to evaluate headaches.   Ashley [name]

## 2014-12-25 NOTE — ED Notes (Signed)
Pt to xray

## 2014-12-25 NOTE — ED Notes (Signed)
Lab called to report that troponin and CMP hemolyzed and it will need to be redrawn.

## 2014-12-25 NOTE — ED Notes (Signed)
Pt with redness and itching to left ac after abx started.    The redness does not cont. Up the arm past the [facility].    She is not SOB, no hives noted.   She reports that she was cleaned there earlier for lab draw/IV and they possible used chlorihexadine- which she is allergiic to.     Area cleansed with rubbung alcohol and cold cloth placed.

## 2014-12-25 NOTE — ED Notes (Signed)
Patient to ED via EMS from MD office with report of worsening cough and chest pressure. Patient reports this cough is much like cough she has had in past when she was diagnosed with Pneumonia.

## 2014-12-25 NOTE — ED Notes (Signed)
Pt ambulated in hallway with pulse ox.   After 2-3 minutes she became tired and reported feeling SOB.   Her O2 sat dropped to 88%.  She was put back in the bed and her O2 sat recovered quickly back to 90's but she cont to report feeling fatigue.

## 2014-12-25 NOTE — H&P (Signed)
[facility] NAME: Ashley [name]    MR#:  [id number]  DATE OF BIRTH:  [birth year]   DATE OF ADMISSION:  12/25/2014  PRIMARY CARE PHYSICIAN: [name], MD   REQUESTING/REFERRING PHYSICIAN: Marta Antu  CHIEF COMPLAINT:   Chief Complaint  Patient presents with  . Cough  . Chest Pain    HISTORY OF PRESENT ILLNESS:  Ashley [name]  is a 69 y.o. female with a known history of systolic congestive heart failure status post AICD placement presenting with shortness of breath. Multiple recent sick contacts with URI like symptoms as well as pneumonia she has been complaining of cough nonproductive after one week total duration however worsening. Now has shortness of breath mainly dyspnea on exertion denies any orthopnea, edema, chest pain. Upon ambulation in the emergency department she noted to desaturate to the mid 80s on room air. No further symptomatology at this time  PAST MEDICAL HISTORY:   Past Medical History  Diagnosis Date  . Migraine without aura, without mention of intractable migraine without mention of status migrainosus   . History of diverticulitis of colon   . Pure hypercholesterolemia   . Irritable bowel syndrome   . Laryngeal nodule     Dr. Thomasena Edis  . Fibrocystic breast disease   . Hemorrhoids   . Anxiety   . GERD (gastroesophageal reflux disease)   . Systolic CHF     EF 09-38%, LV dilation, significant MR, goal weight 150lbs  . Nonischemic cardiomyopathy   . History of pneumonia 2012  . Mitral regurgitation   . Anxiety   . Bronchiectasis     possibly mild, treat URIs aggressively  . Hypertension   . Heart murmur   . Angina   . Lung disease 07/14/11  . Shortness of breath     "resting"  . Anemia   . Fibromyalgia   . Depression   . Disorder of vocal cords   . Chronic sinusitis   . Insomnia   . Osteoporosis 01/2013  . sigmoid diverticulitis with perforation, abscess and fistula  09/30/2013  . H/O multiple allergies 10/10/2013  . PONV (postoperative nausea and vomiting)   . CHF (congestive heart failure) 2011  . Pneumonia 2011  . Pacemaker   . Automatic implantable cardioverter-defibrillator in situ 2012  . Osteoarthritis     knees, back, hands, low back,   . Cancer     basal cell, arms & neck  . Sleep apnea 2010    borderline, inconclusive- /w [place]   . Hemorrhoids   . Diarrhea 07/14/2008    Qualifier: Diagnosis of  By: Glori Bickers MD, Carmell Austria   . Allergy     PAST SURGICAL HISTORY:   Past Surgical History  Procedure Laterality Date  . Appendectomy    . Mandible surgery      "got about 6 pins in the bottom of my jaw"  . Knee arthroscopy w/ orif      Left; "meniscus tear"  . Osteotomy      Left foot  . 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 Dexa  03/28/99    osteopenia L/S  . Colonoscopy  1998    nml (sigmoid-Gilbert-nol)  . Head mri, head ct  10/1998    ? H/A focus ([facility])  . Emg/mcv  10/16/01    + mild carpal tunnel  . Dexa  03/12/03    2.2 Spine 0.1 Hip  Osteopenia  .  Mri right hip  12/12/01    Tendonitis Gluteus Medius to Gtr Trochanter Neg Bursitis (Dr. Eulas Post)  . L/s films  11/21/01    nml; Right hip nml  . Mri u/s  11/21/01    min. DDD L/3-4  . Colonoscopy  01/04/04    polyps, diverticulosis/severe  . Dexa  11/14/05    1.9 Spine 0.3 Hip  slight improvement  . Flex laryngoscopy  06/11/06    ([facility]) nml  . Dexa  11/22/06    improved osteo and fem neck  . [place] bunionectomy  11/04/08    Right Great Toe (Dr. Beola Cord)  . Breast mass excision  01/2010    Left-fibrocystic change w/intraductal papilloma, no malignancy  . Ct chest  12/2010    possible bronchiectasis lower lobes, 02/2011 - enlarged bilateral effusions, bibasilar atx  . Stress myoview  02/2011    no significant ischemia  . Ct chest  04/2011    no PE.  abnormal R hilar and mediastinal adenopathy --> on rpt 05/2011, resolved  . US echocardiography  04/2011    mildly dilated  LV, EF 25-30%, hypokinesis, restrictive physiology, mod MR, no AS  . Icd placement  07/14/11    w/pacemaker  . Tonsillectomy and adenoidectomy    . Breast biopsy      "I've had 2-3 bx; all benign"  . Augmentation mammaplasty    . 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 Abdominal hysterectomy  1987    w/BSO  . Lasik      left eye  . Knee surgery      left  . Mandible surgery    . Sinus surgery      x3 with balloon   . Dexa  01/2013    T score -2.6 at spine  . Partial colectomy N/A 10/02/2013    Procedure: PARTIAL COLECTOMY;  Surgeon: Imogene Burn. Georgette Dover, MD;  Location: [facility];  Service: General;  Laterality: N/A;  . Colostomy N/A 10/02/2013    Procedure: DESCENDING COLOSTOMY;  Surgeon: Imogene Burn. Georgette Dover, MD;  Location: [facility];  Service: General;  Laterality: N/A;  . Colostomy reversal  02/10/2014    dr Georgette Dover  . Colostomy reversal N/A 02/10/2014    Procedure: COLOSTOMY REVERSAL;  Surgeon: [name], MD;  Location: [facility];  Service: General;  Laterality: N/A;  . Implantable cardioverter defibrillator implant N/A 07/14/2011    Procedure: IMPLANTABLE CARDIOVERTER DEFIBRILLATOR IMPLANT;  Surgeon: Evans Lance, MD;  Location: [facility] CATH LAB;  Service: Cardiovascular;  Laterality: N/A;  . Esophagogastroduodenoscopy  09/2014    small HH, irregular Z line, fundic gland polyps with mild gastritis ([place])  . Ncs  11/2014    L ulnar neuropathy, mild B median nerve entrapment ([place])    SOCIAL HISTORY:   Social History  Substance Use Topics  . Smoking status: Never Smoker   . Smokeless tobacco: Never Used     Comment: Lived with smokers x 23 yrs, smoked herself "for a week"  . Alcohol Use: No    FAMILY HISTORY:   Family History  Problem Relation Age of Onset  . Lung cancer Father     + smoker  . Dementia Mother   . Osteoporosis Mother     Lumbar spine  . Stroke Mother     x 5 @ 30 YOA  . Arthritis Mother     hands  . Emphysema Mother   .  Alcohol abuse Paternal Grandfather   . Hypertension Maternal Grandfather     ?  . Stroke Paternal Grandfather     ?  Marland Kitchen Heart disease Paternal Grandmother   . Heart disease Paternal Grandfather   . Colon polyps Father   . Colon cancer Neg Hx   . Esophageal cancer Neg Hx   . Gallbladder disease Neg Hx     DRUG ALLERGIES:   Allergies  Allergen Reactions  . Antihistamines, Chlorpheniramine-Type Other (See Comments)    Makes her eyes look like she sees strobe lights  . Codeine Other (See Comments)    Confusion and dizziness  . Cyclobenzaprine Other (See Comments)    Adverse reaction - back and throat pain, dizziness, exhaustion  . Cymbalta [Duloxetine Hcl] Other (See Comments)    "body spasms and made me feel weird in my head"   . Influenza Vac Split [Flu Virus Vaccine] Other (See Comments)    "allergic to concentrated eggs that are in vaccine"  . Penicillins Other (See Comments)    REACTION: "told 50 years ago I couldn't take it; might be immune to it", able to take amoxicillin  . Sulfasalazine Other (See Comments)    "makes my whole body smell like sulfa & makes me sick just smelling it"  . Atorvastatin Other (See Comments)    Severe muscle cramps to generic atorvastatin.  Able to take brand lipitor.  . Chlorhexidine Other (See Comments)    blisters  . Dilaudid [Hydromorphone Hcl] Other (See Comments)    Feels like she is burning internally   . Latex Nausea Only, Rash and Other (See Comments)    Pain and infection  . Morphine And Related Other (See Comments)    Internally feels like she is on fire   . Pravastatin Other (See Comments)    myalgias  . Prevacid [Lansoprazole] Other (See Comments)    Worsened GI side effects  . Red Dye Nausea Only and Swelling  . Rosuvastatin Other (See Comments)    Was not effective controlling lipids  . Simvastatin Other (See Comments)    Leg cramps  . Other     NOTE: pt is able to take cephalosporins without reaction  . Tape Other (See  Comments)    All adhesives  - Blisters, itching and burning   . Metaxalone Other (See Comments)    REACTION: unknown    REVIEW OF SYSTEMS:  REVIEW OF SYSTEMS:  CONSTITUTIONAL: Denies fevers, chills, fatigue, weakness.  EYES: Denies blurred vision, double vision, or eye pain.  EARS, NOSE, THROAT: Denies tinnitus, ear pain, hearing loss.  RESPIRATORY: Positive cough, shortness of breath,  denies wheezing  CARDIOVASCULAR: Denies chest pain, palpitations, edema.  GASTROINTESTINAL: Denies nausea, vomiting, diarrhea, abdominal pain.  GENITOURINARY: Denies dysuria, hematuria.  ENDOCRINE: Denies nocturia or thyroid problems. HEMATOLOGIC AND LYMPHATIC: Denies easy bruising or bleeding.  SKIN: Denies rash or lesions.  MUSCULOSKELETAL: Denies pain in neck, back, shoulder, knees, hips, or further arthritic symptoms.  NEUROLOGIC: Denies paralysis, paresthesias.  PSYCHIATRIC: Denies anxiety or depressive symptoms. Otherwise full review of systems performed by me is negative.   MEDICATIONS AT HOME:   Prior to Admission medications   Medication Sig Start Date End Date Taking? Authorizing Provider  acetaminophen (TYLENOL) 500 MG tablet Take 1,000 mg by mouth every 6 (six) hours as needed for mild pain or headache.   Yes Historical Provider, MD  alendronate (FOSAMAX) 70 MG tablet Take 70 mg by mouth once a week. Patient takes every Thursday  Yes Historical Provider, MD  ALPRAZolam (XANAX) 0.25 MG tablet Take 0.125 mg by mouth 2 (two) times daily as needed for anxiety.   Yes Historical Provider, MD  Ascorbic Acid (VITAMIN C) 1000 MG tablet Take 1,000 mg by mouth daily.   Yes Historical Provider, MD  aspirin EC 81 MG tablet Take 81 mg by mouth daily.   Yes Historical Provider, MD  atorvastatin (LIPITOR) 40 MG tablet Take 1 tablet (40 mg total) by mouth every other day. 06/12/14  Yes [name], MD  B Complex Vitamins (VITAMIN B COMPLEX PO) Take 1 capsule by mouth daily.   Yes Historical  Provider, MD  Calcium Carbonate-Vitamin D (CALCIUM + D PO) Take 1 tablet by mouth daily.   Yes Historical Provider, MD  carvedilol (COREG) 12.5 MG tablet Take 1 tablet (12.5 mg total) by mouth 2 (two) times daily with a meal. 06/12/14  Yes Minna Merritts, MD  ferrous sulfate 325 (65 FE) MG tablet Take 325 mg by mouth every other day.   Yes Historical Provider, MD  mometasone (ELOCON) 0.1 % lotion Apply 1 application topically as needed (for eczema).   Yes Historical Provider, MD  omeprazole (PRILOSEC) 40 MG capsule Take 1 capsule (40 mg total) by mouth 2 (two) times daily before a meal. 06/12/14  Yes [name], MD  polyethylene glycol (MIRALAX / GLYCOLAX) packet Take 17 g by mouth daily as needed for mild constipation.    Yes Historical Provider, MD  Probiotic Product (PROBIOTIC DAILY) CAPS Take 1 capsule by mouth daily.   Yes Historical Provider, MD  Pyridoxine HCl (VITAMIN B6 PO) Take 1 capsule by mouth daily.   Yes Historical Provider, MD  sertraline (ZOLOFT) 25 MG tablet Take 1 tablet (25 mg total) by mouth daily. Patient taking differently: Take 12.5 mg by mouth 2 (two) times daily.  10/28/14  Yes [name], MD  spironolactone (ALDACTONE) 25 MG tablet Take 25 mg by mouth daily.    Yes Historical Provider, MD  triamcinolone (NASACORT) 55 MCG/ACT AERO nasal inhaler Place 2 sprays into the nose 2 (two) times daily.   Yes Historical Provider, MD  vitamin E 100 UNIT capsule Take 100 Units by mouth daily.   Yes Historical Provider, MD  Wheat Dextrin (BENEFIBER DRINK MIX PO) Take 1 packet by mouth daily. Pt takes [facility] Natural Fiber   Yes Historical Provider, MD      VITAL SIGNS:  Blood pressure 138/73, pulse 75, temperature 98.1 F (36.7 C), temperature source Oral, resp. rate 20, height 5\' 5"  (1.651 m), weight 174 lb (78.926 kg), SpO2 98 %.  PHYSICAL EXAMINATION:  VITAL SIGNS: Filed Vitals:   12/25/14 2337  BP:   Pulse: 75  Temp:   Resp:    GENERAL:69 y.o.female currently in  no acute distress.  HEAD: Normocephalic, atraumatic.  EYES: Pupils equal, round, reactive to light. Extraocular muscles intact. No scleral icterus.  MOUTH: Moist mucosal membrane. Dentition intact. No abscess noted.  EAR, NOSE, THROAT: Clear without exudates. No external lesions.  NECK: Supple. No thyromegaly. No nodules. No JVD.  PULMONARY:Coarse breath sounds right lung fields no frank wheeze rails rhonchi. No use of accessory muscles, Good respiratory effort. good air entry bilaterally CHEST: Nontender to palpation.  CARDIOVASCULAR: S1 and S2. Regular rate and rhythm. No murmurs, rubs, or gallops. No edema. Pedal pulses 2+ bilaterally.  GASTROINTESTINAL: Soft, nontender, nondistended. No masses. Positive bowel sounds. No hepatosplenomegaly.  MUSCULOSKELETAL: No swelling, clubbing, or edema. Range of motion full in all extremities.  NEUROLOGIC: Cranial nerves II through XII are intact. No gross focal neurological deficits. Sensation intact. Reflexes intact.  SKIN: No ulceration, lesions, rashes, or cyanosis. Skin warm and dry. Turgor intact.  PSYCHIATRIC: Mood, affect within normal limits. The patient is awake, alert and oriented x 3. Insight, judgment intact.    LABORATORY PANEL:   CBC  Recent Labs Lab 12/25/14 1849  WBC 12.2*  HGB 13.2  HCT 40.1  PLT 242   ------------------------------------------------------------------------------------------------------------------  Chemistries   Recent Labs Lab 12/25/14 2026  NA 140  K 4.1  CL 105  CO2 25  GLUCOSE 100*  BUN 18  CREATININE 1.05*  CALCIUM 9.8  AST 32  ALT 23  ALKPHOS 118  BILITOT 0.4   ------------------------------------------------------------------------------------------------------------------  Cardiac Enzymes  Recent Labs Lab 12/25/14 2026  TROPONINI <0.03   ------------------------------------------------------------------------------------------------------------------  RADIOLOGY:  Dg Chest 2  View  12/25/2014   CLINICAL DATA:  Cough.  Shortness of breath.  Pneumonia symptoms.  EXAM: CHEST - 2 VIEW  COMPARISON:  None.  FINDINGS: The heart size is normal. Emphysematous changes are again noted. No focal airspace disease is present. An AICD wire is in place.  IMPRESSION: Emphysema.  No acute could pulmonary disease or significant interval change.   Electronically Signed   By: San Morelle M.D.   On: 12/25/2014 19:10    EKG:   Orders placed or performed in visit on 12/25/14  . EKG 12-Lead    IMPRESSION AND PLAN:   69 year old Caucasian female history of systolic congestive heart failure presenting with shortness of breath and cough.  1. Acute respiratory insufficiency with hypoxia: Secondary to bronchospasm/reactive airway disease: Supplemental oxygen, DuoNeb treatments, steroids 2. Hyperlipidemia unspecified: Lipitor 3. GERD without esophagitis: PPI therapy 4. Chronic systolic congestive heart failure: Continue Coreg 5. Venous thromboembolism prophylactic: Heparin subcutaneous  All the records are reviewed and case discussed with ED provider. Management plans discussed with the patient, family and they are in agreement.  CODE STATUS: Full  TOTAL TIME TAKING CARE OF THIS PATIENT: 35 minutes.    Hower,  Karenann Cai.D on 12/25/2014 at 11:47 PM  Between 7am to 6pm - Pager - [phone]  After 6pm: House Pager: - [phone]  [facility]  Office  [phone]  CC: Primary care physician; [name], MD

## 2014-12-25 NOTE — ED Notes (Signed)
Redness is no worse,  Pt remains free of hives/SOB.

## 2014-12-25 NOTE — Telephone Encounter (Signed)
Ok to refill 

## 2014-12-25 NOTE — ED Notes (Signed)
Additional labs obtained via venipuncture with #23 ga butterfly to left hand

## 2014-12-26 ENCOUNTER — Encounter: Payer: Self-pay | Admitting: *Deleted

## 2014-12-26 DIAGNOSIS — K219 Gastro-esophageal reflux disease without esophagitis: Secondary | ICD-10-CM | POA: Diagnosis not present

## 2014-12-26 DIAGNOSIS — J209 Acute bronchitis, unspecified: Secondary | ICD-10-CM | POA: Diagnosis not present

## 2014-12-26 DIAGNOSIS — R0689 Other abnormalities of breathing: Secondary | ICD-10-CM | POA: Diagnosis not present

## 2014-12-26 DIAGNOSIS — R0989 Other specified symptoms and signs involving the circulatory and respiratory systems: Secondary | ICD-10-CM | POA: Diagnosis not present

## 2014-12-26 DIAGNOSIS — E785 Hyperlipidemia, unspecified: Secondary | ICD-10-CM | POA: Diagnosis not present

## 2014-12-26 MED ORDER — ALPRAZOLAM 0.25 MG PO TABS: 0.1250 mg | ORAL_TABLET | Freq: Two times a day (BID) | ORAL | Status: DC | PRN

## 2014-12-26 MED ORDER — TRIAMCINOLONE ACETONIDE 55 MCG/ACT NA AERO: 2.0000 | INHALATION_SPRAY | Freq: Two times a day (BID) | NASAL | Status: DC

## 2014-12-26 MED ORDER — FLUTICASONE PROPIONATE 50 MCG/ACT NA SUSP
1.0000 | Freq: Every day | NASAL | Status: DC
  Filled 2014-12-26: qty 16

## 2014-12-26 MED ORDER — POLYETHYLENE GLYCOL 3350 17 G PO PACK: 17.0000 g | PACK | Freq: Every day | ORAL | Status: DC | PRN

## 2014-12-26 MED ORDER — TRIAMCINOLONE ACETONIDE 55 MCG/ACT NA AERO
2.0000 | INHALATION_SPRAY | Freq: Two times a day (BID) | NASAL | Status: DC
  Filled 2014-12-26: qty 21.6

## 2014-12-26 MED ORDER — SPIRONOLACTONE 25 MG PO TABS
25.0000 mg | ORAL_TABLET | Freq: Every day | ORAL | Status: DC
  Administered 2014-12-26: 25 mg via ORAL
  Filled 2014-12-26: qty 1

## 2014-12-26 MED ORDER — CARVEDILOL 12.5 MG PO TABS
12.5000 mg | ORAL_TABLET | Freq: Two times a day (BID) | ORAL | Status: DC
  Administered 2014-12-26 (×2): 12.5 mg via ORAL
  Filled 2014-12-26 (×2): qty 1

## 2014-12-26 MED ORDER — FERROUS SULFATE 325 (65 FE) MG PO TABS
325.0000 mg | ORAL_TABLET | ORAL | Status: DC
  Administered 2014-12-26: 325 mg via ORAL
  Filled 2014-12-26: qty 1

## 2014-12-26 MED ORDER — ATORVASTATIN CALCIUM 20 MG PO TABS
40.0000 mg | ORAL_TABLET | ORAL | Status: DC
  Filled 2014-12-26: qty 2

## 2014-12-26 MED ORDER — SERTRALINE HCL 25 MG PO TABS
12.5000 mg | ORAL_TABLET | Freq: Two times a day (BID) | ORAL | Status: DC
  Administered 2014-12-26 (×2): 12.5 mg via ORAL
  Filled 2014-12-26 (×2): qty 0.5
  Filled 2014-12-26: qty 1
  Filled 2014-12-26: qty 0.5

## 2014-12-26 MED ORDER — LEVOFLOXACIN IN D5W 750 MG/150ML IV SOLN
750.0000 mg | INTRAVENOUS | Status: DC
  Administered 2014-12-26: 14:00:00 750 mg via INTRAVENOUS
  Filled 2014-12-26 (×3): qty 150

## 2014-12-26 MED ORDER — ASPIRIN EC 81 MG PO TBEC
81.0000 mg | DELAYED_RELEASE_TABLET | Freq: Every day | ORAL | Status: DC
  Administered 2014-12-26: 81 mg via ORAL
  Filled 2014-12-26 (×2): qty 1

## 2014-12-26 MED ORDER — PANTOPRAZOLE SODIUM 40 MG PO TBEC
40.0000 mg | DELAYED_RELEASE_TABLET | Freq: Every day | ORAL | Status: DC
  Filled 2014-12-26: qty 1

## 2014-12-26 NOTE — Plan of Care (Addendum)
Problem: Discharge Progression Outcomes Goal: Other Discharge Outcomes/Goals Outcome: Progressing Plan of care progress to goal: Pt on IV ABX and IV solumedrol. Walked around the unit 5 times on room air, pt remained in mid 90s. C/o BL flank pain, pt requested tylenol for pain. C/o cough, PRN medicine given, pt stated relief. Tolerating diet well.

## 2014-12-26 NOTE — Plan of Care (Signed)
Problem: Discharge Progression Outcomes Goal: Other Discharge Outcomes/Goals Pt admitted for ARF, refusing bed alarm and gripper socks, O2 sats on room air resting 92-96 % , pt with persistant non productive cough. Pt cannot use our sheets or gown, ED sheets in place on bed. Pt cannot take whole doses of medication tylenol given for headache x 1.

## 2014-12-26 NOTE — Progress Notes (Signed)
[facility] NAME: Ashley [name]    MR#:  [id number]  DATE OF BIRTH:  17-May-1945  SUBJECTIVE:  CHIEF COMPLAINT:  Patient is reporting shortness of breath and cough with exertion. Doing fine while resting  REVIEW OF SYSTEMS:  CONSTITUTIONAL: No fever, fatigue or weakness.  EYES: No blurred or double vision.  EARS, NOSE, AND THROAT: No tinnitus or ear pain.  RESPIRATORY: Reporting cough, exertional shortness of breath, no wheezing or hemoptysis.  CARDIOVASCULAR: No chest pain, orthopnea, edema.  GASTROINTESTINAL: No nausea, vomiting, diarrhea or abdominal pain.  GENITOURINARY: No dysuria, hematuria.  ENDOCRINE: No polyuria, nocturia,  HEMATOLOGY: No anemia, easy bruising or bleeding SKIN: No rash or lesion. MUSCULOSKELETAL: No joint pain or arthritis.   NEUROLOGIC: No tingling, numbness, weakness.  PSYCHIATRY: No anxiety or depression.   DRUG ALLERGIES:   Allergies  Allergen Reactions  . Antihistamines, Chlorpheniramine-Type Other (See Comments)    Makes her eyes look like she sees strobe lights  . Codeine Other (See Comments)    Confusion and dizziness  . Cyclobenzaprine Other (See Comments)    Adverse reaction - back and throat pain, dizziness, exhaustion  . Cymbalta [Duloxetine Hcl] Other (See Comments)    "body spasms and made me feel weird in my head"   . Influenza Vac Split [Flu Virus Vaccine] Other (See Comments)    "allergic to concentrated eggs that are in vaccine"  . Penicillins Other (See Comments)    REACTION: "told 50 years ago I couldn't take it; might be immune to it", able to take amoxicillin  . Sulfasalazine Other (See Comments)    "makes my whole body smell like sulfa & makes me sick just smelling it"  . Atorvastatin Other (See Comments)    Severe muscle cramps to generic atorvastatin.  Able to take brand lipitor.  . Chlorhexidine Other (See Comments)    blisters  . Dilaudid [Hydromorphone  Hcl] Other (See Comments)    Feels like she is burning internally   . Latex Nausea Only, Rash and Other (See Comments)    Pain and infection  . Morphine And Related Other (See Comments)    Internally feels like she is on fire   . Pravastatin Other (See Comments)    myalgias  . Prevacid [Lansoprazole] Other (See Comments)    Worsened GI side effects  . Red Dye Nausea Only and Swelling  . Rosuvastatin Other (See Comments)    Was not effective controlling lipids  . Simvastatin Other (See Comments)    Leg cramps  . Other     NOTE: pt is able to take cephalosporins without reaction  . Tape Other (See Comments)    All adhesives  - Blisters, itching and burning   . Metaxalone Other (See Comments)    REACTION: unknown    VITALS:  Blood pressure 107/45, pulse 107, temperature 98.2 F (36.8 C), temperature source Oral, resp. rate 19, height 5\' 5"  (1.651 m), weight 77.384 kg (170 lb 9.6 oz), SpO2 96 %.  PHYSICAL EXAMINATION:  GENERAL:  69 y.o.-year-old patient lying in the bed with no acute distress.  EYES: Pupils equal, round, reactive to light and accommodation. No scleral icterus. Extraocular muscles intact.  HEENT: Head atraumatic, normocephalic. Oropharynx and nasopharynx clear.  NECK:  Supple, no jugular venous distention. No thyroid enlargement, no tenderness.  LUNGS: Normal breath sounds bilaterally except right lateral lower lobe with crackles, no wheezing, rales, or rhonchi.o use of accessory muscles of respiration.  CARDIOVASCULAR: S1, S2 normal. No murmurs, rubs, or gallops.  ABDOMEN: Soft, nontender, nondistended. Bowel sounds present. No organomegaly or mass.  EXTREMITIES: No pedal edema, cyanosis, or clubbing.  NEUROLOGIC: Cranial nerves II through XII are intact. Muscle strength 5/5 in all extremities. Sensation intact. Gait not checked.  PSYCHIATRIC: The patient is alert and oriented x 3.  SKIN: No obvious rash, lesion, or ulcer.    LABORATORY PANEL:   CBC  Recent  Labs Lab 12/25/14 1849  WBC 12.2*  HGB 13.2  HCT 40.1  PLT 242   ------------------------------------------------------------------------------------------------------------------  Chemistries   Recent Labs Lab 12/25/14 2026  NA 140  K 4.1  CL 105  CO2 25  GLUCOSE 100*  BUN 18  CREATININE 1.05*  CALCIUM 9.8  AST 32  ALT 23  ALKPHOS 118  BILITOT 0.4   ------------------------------------------------------------------------------------------------------------------  Cardiac Enzymes  Recent Labs Lab 12/25/14 2026  TROPONINI <0.03   ------------------------------------------------------------------------------------------------------------------  RADIOLOGY:  Dg Chest 2 View  12/25/2014   CLINICAL DATA:  Cough.  Shortness of breath.  Pneumonia symptoms.  EXAM: CHEST - 2 VIEW  COMPARISON:  None.  FINDINGS: The heart size is normal. Emphysematous changes are again noted. No focal airspace disease is present. An AICD wire is in place.  IMPRESSION: Emphysema.  No acute could pulmonary disease or significant interval change.   Electronically Signed   By: San Morelle M.D.   On: 12/25/2014 19:10    EKG:   Orders placed or performed in visit on 12/25/14  . EKG 12-Lead    ASSESSMENT AND PLAN:   69 year old Caucasian female history of systolic congestive heart failure presenting with shortness of breath and cough.  1. Acute respiratory insufficiency with hypoxia: Secondary to acute bronchitis induced bronchospasm/reactive airway disease: Supplemental oxygen, DuoNeb treatments, steroids. We will continue levofloxacin Check ambulatory pulse ox  2. Hyperlipidemia unspecified: Continue Lipitor  3. GERD without esophagitis: Continue PPI therapy  4. Chronic systolic congestive heart failure: Continue Coreg  5. Venous thromboembolism prophylactic: Heparin subcutaneous     All the records are reviewed and case discussed with Care Management/Social  Workerr. Management plans discussed with the patient, family and they are in agreement.  CODE STATUS: Full code  TOTAL TIME TAKING CARE OF THIS PATIENT:  38minutes.   POSSIBLE D/C IN 1-2 DAYS, DEPENDING ON CLINICAL CONDITION.   Nicholes Mango M.D on 12/26/2014 at 1:34 PM  Between 7am to 6pm - Pager - [phone] After 6pm go to [email or web address] - password [email or web address] [facility]  [facility]  Office  [phone]  CC: Primary care physician; [name], MD

## 2014-12-27 DIAGNOSIS — R0689 Other abnormalities of breathing: Secondary | ICD-10-CM | POA: Diagnosis not present

## 2014-12-27 DIAGNOSIS — J209 Acute bronchitis, unspecified: Secondary | ICD-10-CM | POA: Diagnosis not present

## 2014-12-27 DIAGNOSIS — E785 Hyperlipidemia, unspecified: Secondary | ICD-10-CM | POA: Diagnosis not present

## 2014-12-27 DIAGNOSIS — K219 Gastro-esophageal reflux disease without esophagitis: Secondary | ICD-10-CM | POA: Diagnosis not present

## 2014-12-27 DIAGNOSIS — R0989 Other specified symptoms and signs involving the circulatory and respiratory systems: Secondary | ICD-10-CM | POA: Diagnosis not present

## 2014-12-27 MED ORDER — ALBUTEROL SULFATE HFA 108 (90 BASE) MCG/ACT IN AERS: 2.0000 | INHALATION_SPRAY | Freq: Four times a day (QID) | RESPIRATORY_TRACT | Status: DC | PRN

## 2014-12-27 MED ORDER — CIPROFLOXACIN HCL 250 MG PO TABS: 250.0000 mg | ORAL_TABLET | Freq: Two times a day (BID) | ORAL | Status: DC

## 2014-12-27 MED ORDER — PREDNISONE 20 MG PO TABS: 20.0000 mg | ORAL_TABLET | Freq: Every day | ORAL | Status: DC

## 2014-12-27 NOTE — Discharge Instructions (Signed)
Activity as tolerated Diet low-fat Follow-up with primary care physician in a week or sooner as needed

## 2014-12-27 NOTE — Progress Notes (Signed)
MD making rounds. Discharge orders received. IV removed. Prescriptions given to patient. Discharge paperwork provided, explained, signed and witnessed. No unanswered questions. Escorted via wheelchair by nursing staff. All belongings sent with patient and family.

## 2014-12-27 NOTE — Plan of Care (Signed)
Problem: Discharge Progression Outcomes Goal: Discharge plan in place and appropriate Individualization of Care Pt prefers to be called Ashley [name] Hx of anxiety, GERD, CHF, HTN, Heart Murmur, osteoarthritis on home meds Moderate Fall precautions Goal: Other Discharge Outcomes/Goals Plan of Care Progress to Goal:  Pt with np cough at intervals, prn cough med given with relief, no c/o pain, pt without s/s of distress.

## 2014-12-28 ENCOUNTER — Telehealth: Payer: Self-pay | Admitting: *Deleted

## 2014-12-28 NOTE — Telephone Encounter (Signed)
Transition Care Management Follow-up Telephone Call   Date discharged? 12/27/14   How have you been since you were released from the hospital? Fatigue at times, but overall improved.   Do you understand why you were in the hospital? yes   Do you understand the discharge instructions? yes   Where were you discharged to? Home   Items Reviewed:  Medications reviewed: yes  Allergies reviewed: yes  Dietary changes reviewed: no  Referrals reviewed: yes, cardiology   Functional Questionnaire:   Activities of Daily Living (ADLs):   She states they are independent in the following: ambulation, bathing and hygiene, feeding, continence, grooming, toileting and dressing States they require assistance with the following: None   Any transportation issues/concerns?: no   Any patient concerns? no   Confirmed importance and date/time of follow-up visits scheduled yes, 12/31/14 @ 1500 (acute OV already scheduled at this time)  Provider Appointment booked with [name], MD  Confirmed with patient if condition begins to worsen call PCP or go to the ER.  Patient was given the office number and encouraged to call back with question or concerns.  : yes

## 2014-12-30 NOTE — Discharge Summary (Signed)
[facility] NAME: Ashley [name]    MR#:  [id number]  DATE OF BIRTH:  May 02, 1946  DATE OF ADMISSION:  12/25/2014 ADMITTING PHYSICIAN: Lytle Butte, MD  DATE OF DISCHARGE: 12/27/2014 12:00 PM  PRIMARY CARE PHYSICIAN: [name], MD    ADMISSION DIAGNOSIS:  Hypoxemia [R09.02] Dyspnea on exertion [R06.09] Acute bronchitis, unspecified organism [J20.9]  DISCHARGE DIAGNOSIS:  Principal Problem:   Acute respiratory insufficiency Acute bronchitis  SECONDARY DIAGNOSIS:   Past Medical History  Diagnosis Date  . Migraine without aura, without mention of intractable migraine without mention of status migrainosus   . History of diverticulitis of colon   . Pure hypercholesterolemia   . Irritable bowel syndrome   . Laryngeal nodule     Dr. Thomasena Edis  . Fibrocystic breast disease   . Hemorrhoids   . Anxiety   . GERD (gastroesophageal reflux disease)   . Systolic CHF     EF 12-45%, LV dilation, significant MR, goal weight 150lbs  . Nonischemic cardiomyopathy   . History of pneumonia 2012  . Mitral regurgitation   . Anxiety   . Bronchiectasis     possibly mild, treat URIs aggressively  . Hypertension   . Heart murmur   . Angina   . Lung disease 07/14/11  . Shortness of breath     "resting"  . Anemia   . Fibromyalgia   . Depression   . Disorder of vocal cords   . Chronic sinusitis   . Insomnia   . Osteoporosis 01/2013  . sigmoid diverticulitis with perforation, abscess and fistula 09/30/2013  . H/O multiple allergies 10/10/2013  . PONV (postoperative nausea and vomiting)   . CHF (congestive heart failure) 2011  . Pneumonia 2011  . Pacemaker   . Automatic implantable cardioverter-defibrillator in situ 2012  . Osteoarthritis     knees, back, hands, low back,   . Cancer     basal cell, arms & neck  . Sleep apnea 2010    borderline, inconclusive- /w [place]   . Hemorrhoids   . Diarrhea 07/14/2008   Qualifier: Diagnosis of  By: Glori Bickers MD, Carmell Austria   . Allergy     HOSPITAL COURSE:   69 year old Caucasian female history of systolic congestive heart failure presenting with shortness of breath and cough.  1. Acute respiratory insufficiency with hypoxia: Secondary to acute bronchitis induced bronchospasm/reactive airway disease: improved withSupplemental oxygen, DuoNeb treatments, steroids. Given  Levofloxacin in hospital, clinically improved. Weaned off o2 to RA and changed to PO cipro at d/c   2. Hyperlipidemia unspecified: Continue Lipitor  3. GERD without esophagitis: Continue PPI therapy  4. Chronic systolic congestive heart failure: Continue Coreg  5. Venous thromboembolism prophylactic: Heparin subcutaneous   DISCHARGE CONDITIONS:   fair  CONSULTS OBTAINED:  Treatment Team:  Lytle Butte, MD   PROCEDURES none  DRUG ALLERGIES:   Allergies  Allergen Reactions  . Antihistamines, Chlorpheniramine-Type Other (See Comments)    Makes her eyes look like she sees strobe lights  . Codeine Other (See Comments)    Confusion and dizziness  . Cyclobenzaprine Other (See Comments)    Adverse reaction - back and throat pain, dizziness, exhaustion  . Cymbalta [Duloxetine Hcl] Other (See Comments)    "body spasms and made me feel weird in my head"   . Influenza Vac Split [Flu Virus Vaccine] Other (See Comments)    "allergic to concentrated eggs that are in vaccine"  . Penicillins  Other (See Comments)    REACTION: "told 50 years ago I couldn't take it; might be immune to it", able to take amoxicillin  . Sulfasalazine Other (See Comments)    "makes my whole body smell like sulfa & makes me sick just smelling it"  . Atorvastatin Other (See Comments)    Severe muscle cramps to generic atorvastatin.  Able to take brand lipitor.  . Chlorhexidine Other (See Comments)    blisters  . Dilaudid [Hydromorphone Hcl] Other (See Comments)    Feels like she is burning internally   .  Latex Nausea Only, Rash and Other (See Comments)    Pain and infection  . Morphine And Related Other (See Comments)    Internally feels like she is on fire   . Pravastatin Other (See Comments)    myalgias  . Prevacid [Lansoprazole] Other (See Comments)    Worsened GI side effects  . Red Dye Nausea Only and Swelling  . Rosuvastatin Other (See Comments)    Was not effective controlling lipids  . Simvastatin Other (See Comments)    Leg cramps  . Other     NOTE: pt is able to take cephalosporins without reaction  . Tape Other (See Comments)    All adhesives  - Blisters, itching and burning   . Metaxalone Other (See Comments)    REACTION: unknown    DISCHARGE MEDICATIONS:   Discharge Medication List as of 12/27/2014 11:47 AM    START taking these medications   Details  ciprofloxacin (CIPRO) 250 MG tablet Take 1 tablet (250 mg total) by mouth 2 (two) times daily., Starting 12/27/2014, Until Discontinued, Print    predniSONE (DELTASONE) 20 MG tablet Take 1 tablet (20 mg total) by mouth daily with breakfast., Starting 12/28/2014, Until Discontinued, Print      CONTINUE these medications which have CHANGED   Details  albuterol (PROVENTIL HFA;VENTOLIN HFA) 108 (90 BASE) MCG/ACT inhaler Inhale 2 puffs into the lungs every 6 (six) hours as needed for wheezing or shortness of breath., Starting 12/27/2014, Until Discontinued, Print      CONTINUE these medications which have NOT CHANGED   Details  acetaminophen (TYLENOL) 500 MG tablet Take 1,000 mg by mouth every 6 (six) hours as needed for mild pain or headache., Until Discontinued, Historical Med    alendronate (FOSAMAX) 70 MG tablet Take 70 mg by mouth once a week. Patient takes every Thursday, Until Discontinued, Historical Med    ALPRAZolam (XANAX) 0.25 MG tablet Take 0.125 mg by mouth 2 (two) times daily as needed for anxiety., Until Discontinued, Historical Med    Ascorbic Acid (VITAMIN C) 1000 MG tablet Take 1,000 mg by mouth  daily., Until Discontinued, Historical Med    aspirin EC 81 MG tablet Take 81 mg by mouth daily., Until Discontinued, Historical Med    atorvastatin (LIPITOR) 40 MG tablet Take 1 tablet (40 mg total) by mouth every other day., Starting 06/12/2014, Until Discontinued, Normal    B Complex Vitamins (VITAMIN B COMPLEX PO) Take 1 capsule by mouth daily., Until Discontinued, Historical Med    Calcium Carbonate-Vitamin D (CALCIUM + D PO) Take 1 tablet by mouth daily., Until Discontinued, Historical Med    carvedilol (COREG) 12.5 MG tablet Take 1 tablet (12.5 mg total) by mouth 2 (two) times daily with a meal., Starting 06/12/2014, Until Discontinued, Normal    ferrous sulfate 325 (65 FE) MG tablet Take 325 mg by mouth every other day., Until Discontinued, Historical Med    mometasone (  ELOCON) 0.1 % lotion Apply 1 application topically as needed (for eczema)., Until Discontinued, Historical Med    omeprazole (PRILOSEC) 40 MG capsule Take 1 capsule (40 mg total) by mouth 2 (two) times daily before a meal., Starting 06/12/2014, Until Discontinued, Normal    polyethylene glycol (MIRALAX / GLYCOLAX) packet Take 17 g by mouth daily as needed for mild constipation. , Until Discontinued, Historical Med    Probiotic Product (PROBIOTIC DAILY) CAPS Take 1 capsule by mouth daily., Until Discontinued, Historical Med    Pyridoxine HCl (VITAMIN B6 PO) Take 1 capsule by mouth daily., Until Discontinued, Historical Med    sertraline (ZOLOFT) 25 MG tablet Take 1 tablet (25 mg total) by mouth daily., Starting 10/28/2014, Until Discontinued, Normal    spironolactone (ALDACTONE) 25 MG tablet Take 25 mg by mouth daily. , Until Discontinued, Historical Med    triamcinolone (NASACORT) 55 MCG/ACT AERO nasal inhaler Place 2 sprays into the nose 2 (two) times daily., Until Discontinued, Historical Med    vitamin E 100 UNIT capsule Take 100 Units by mouth daily., Until Discontinued, Historical Med    Wheat Dextrin  (BENEFIBER DRINK MIX PO) Take 1 packet by mouth daily. Pt takes [facility] Natural Fiber, Until Discontinued, Historical Med         DISCHARGE INSTRUCTIONS:   F/u with pcp in a week  DIET:  Healthy heart  DISCHARGE CONDITION:  Fair   ACTIVITY:  As tolerated  OXYGEN:  Home Oxygen: no   Oxygen Delivery: none  DISCHARGE LOCATION:  home  If you experience worsening of your admission symptoms, develop shortness of breath, life threatening emergency, suicidal or homicidal thoughts you must seek medical attention immediately by calling 911 or calling your MD immediately  if symptoms less severe.  You Must read complete instructions/literature along with all the possible adverse reactions/side effects for all the Medicines you take and that have been prescribed to you. Take any new Medicines after you have completely understood and accpet all the possible adverse reactions/side effects.   Please note  You were cared for by a hospitalist during your hospital stay. If you have any questions about your discharge medications or the care you received while you were in the hospital after you are discharged, you can call the unit and asked to speak with the hospitalist on call if the hospitalist that took care of you is not available. Once you are discharged, your primary care physician will handle any further medical issues. Please note that NO REFILLS for any discharge medications will be authorized once you are discharged, as it is imperative that you return to your primary care physician (or establish a relationship with a primary care physician if you do not have one) for your aftercare needs so that they can reassess your need for medications and monitor your lab values.     Today  Chief Complaint  Patient presents with  . Cough  . Chest Pain   Pt is feeling fine , sob is improved . Denies any chest pain  ROS: no complaints CONSTITUTIONAL: Denies fevers, chills. Denies any fatigue,  weakness.  EYES: Denies blurry vision, double vision, eye pain. EARS, NOSE, THROAT: Denies tinnitus, ear pain, hearing loss. RESPIRATORY: Denies cough, wheeze, shortness of breath.  CARDIOVASCULAR: Denies chest pain, palpitations, edema.  GASTROINTESTINAL: Denies nausea, vomiting, diarrhea, abdominal pain. Denies bright red blood per rectum. GENITOURINARY: Denies dysuria, hematuria. ENDOCRINE: Denies nocturia or thyroid problems. HEMATOLOGIC AND LYMPHATIC: Denies easy bruising or bleeding. SKIN: Denies rash  or lesion. MUSCULOSKELETAL: Denies pain in neck, back, shoulder, knees, hips or arthritic symptoms.  NEUROLOGIC: Denies paralysis, paresthesias.  PSYCHIATRIC: Denies anxiety or depressive symptoms.   VITAL SIGNS:  Blood pressure 115/54, pulse 93, temperature 98.4 F (36.9 C), temperature source Oral, resp. rate 18, height 5\' 5"  (1.651 m), weight 77.384 kg (170 lb 9.6 oz), SpO2 97 %.  I/O:  No intake or output data in the 24 hours ending 12/30/14 1657  PHYSICAL EXAMINATION:  GENERAL:  69 y.o.-year-old patient lying in the bed with no acute distress.  EYES: Pupils equal, round, reactive to light and accommodation. No scleral icterus. Extraocular muscles intact.  HEENT: Head atraumatic, normocephalic. Oropharynx and nasopharynx clear.  NECK:  Supple, no jugular venous distention. No thyroid enlargement, no tenderness.  LUNGS: Normal breath sounds bilaterally, no wheezing, rales,rhonchi or crepitation. No use of accessory muscles of respiration.  CARDIOVASCULAR: S1, S2 normal. No murmurs, rubs, or gallops.  ABDOMEN: Soft, non-tender, non-distended. Bowel sounds present. No organomegaly or mass.  EXTREMITIES: No pedal edema, cyanosis, or clubbing.  NEUROLOGIC: Cranial nerves II through XII are intact. Muscle strength 5/5 in all extremities. Sensation intact. Gait not checked.  PSYCHIATRIC: The patient is alert and oriented x 3.  SKIN: No obvious rash, lesion, or ulcer.   DATA REVIEW:    CBC  Recent Labs Lab 12/25/14 1849  WBC 12.2*  HGB 13.2  HCT 40.1  PLT 242    Chemistries   Recent Labs Lab 12/25/14 2026  NA 140  K 4.1  CL 105  CO2 25  GLUCOSE 100*  BUN 18  CREATININE 1.05*  CALCIUM 9.8  AST 32  ALT 23  ALKPHOS 118  BILITOT 0.4    Cardiac Enzymes  Recent Labs Lab 12/25/14 2026  TROPONINI <0.03    Microbiology Results  Results for orders placed or performed during the hospital encounter of 05/27/14  Urine culture     Status: None   Collection Time: 05/27/14  7:52 PM  Result Value Ref Range Status   Specimen Description URINE, RANDOM  Final   Special Requests NONE  Final   Colony Count   Final    15,000 COLONIES/ML Performed at [facility]    Culture   Final    ENTEROCOCCUS SPECIES Performed at [facility]    Report Status 05/31/2014 FINAL  Final   Organism ID, Bacteria ENTEROCOCCUS SPECIES  Final      Susceptibility   Enterococcus species - MIC*    AMPICILLIN <=2 SENSITIVE Sensitive     LEVOFLOXACIN 0.5 SENSITIVE Sensitive     NITROFURANTOIN <=16 SENSITIVE Sensitive     VANCOMYCIN 1 SENSITIVE Sensitive     TETRACYCLINE >=16 RESISTANT Resistant     * ENTEROCOCCUS SPECIES  Clostridium Difficile by PCR     Status: None   Collection Time: 05/28/14  1:38 AM  Result Value Ref Range Status   Toxigenic C Difficile by pcr NEGATIVE NEGATIVE Final  Culture, blood (routine x 2)     Status: None   Collection Time: 05/28/14  3:17 AM  Result Value Ref Range Status   Specimen Description BLOOD LEFT ARM  Final   Special Requests BOTTLES DRAWN AEROBIC AND ANAEROBIC 10CC EACH  Final   Culture   Final    NO GROWTH 5 DAYS Note: Culture results may be compromised due to an excessive volume of blood received in culture bottles. Performed at [facility]    Report Status 06/03/2014 FINAL  Final  Culture,  blood (routine x 2)     Status: None   Collection Time: 05/28/14  3:26 AM  Result Value Ref Range Status    Specimen Description BLOOD BLOOD LEFT FOREARM  Final   Special Requests BOTTLES DRAWN AEROBIC ONLY 10CC  Final   Culture   Final    NO GROWTH 5 DAYS Note: Culture results may be compromised due to an excessive volume of blood received in culture bottles. Performed at [facility]    Report Status 06/03/2014 FINAL  Final    RADIOLOGY:  No results found.  EKG:   Orders placed or performed during the hospital encounter of 12/25/14  . EKG      Management plans discussed with the patient, family and they are in agreement.  CODE STATUS:  Advance Directive Documentation        Most Recent Value   Type of Advance Directive  Living will, Healthcare Power of Attorney   Pre-existing out of facility DNR order (yellow form or pink MOST form)     "MOST" Form in Place?        TOTAL TIME TAKING CARE OF THIS PATIENT: 40 minutes.    @[facility] @  on 12/30/2014 at 4:57 PM  Between 7am to 6pm - Pager - [phone]  After 6pm go to [email or web address] - password [email or web address] [facility]  [facility]  Office  [phone]  CC: Primary care physician; [name], MD

## 2014-12-31 ENCOUNTER — Ambulatory Visit: Payer: Medicare Other

## 2014-12-31 ENCOUNTER — Encounter: Payer: Self-pay | Admitting: Family Medicine

## 2014-12-31 ENCOUNTER — Ambulatory Visit (INDEPENDENT_AMBULATORY_CARE_PROVIDER_SITE_OTHER): Payer: Medicare Other | Admitting: Family Medicine

## 2014-12-31 ENCOUNTER — Ambulatory Visit
Admission: RE | Admit: 2014-12-31 | Discharge: 2014-12-31 | Disposition: A | Discharge status: Home / Self Care | Payer: Medicare Other | Source: Ambulatory Visit | Attending: Family Medicine | Admitting: Family Medicine

## 2014-12-31 VITALS — BP 122/80 | HR 82 | Temp 98.2°F | Wt 172.0 lb

## 2014-12-31 DIAGNOSIS — J209 Acute bronchitis, unspecified: Secondary | ICD-10-CM | POA: Diagnosis not present

## 2014-12-31 DIAGNOSIS — R1084 Generalized abdominal pain: Secondary | ICD-10-CM

## 2014-12-31 DIAGNOSIS — R197 Diarrhea, unspecified: Secondary | ICD-10-CM

## 2014-12-31 DIAGNOSIS — R519 Headache, unspecified: Secondary | ICD-10-CM

## 2014-12-31 DIAGNOSIS — R51 Headache: Secondary | ICD-10-CM | POA: Diagnosis present

## 2014-12-31 NOTE — Progress Notes (Signed)
BP 122/80 mmHg  Pulse 82  Temp(Src) 98.2 F (36.8 C) (Oral)  Wt 172 lb (78.019 kg)  SpO2 99%   CC: hosp f/u visit  Subjective:    Patient ID: Ashley [name], female    DOB: 1945-10-10, 69 y.o.   MRN: [id number]  HPI: Ashley [name] is a 70 y.o. female presenting on 12/31/2014 for Hospitalization Follow-up   Pt recently seen at [facility] with chest pain, found to have relative hypoxia to 86% on ambulation. She also had new onset headache. Referred to the ER where she was found to have acute bronchitis with hypoxia, admitted and treated with supplemental oxygen and IV levaquin as well as duonebs (pt denies this) and steroid course. Antibiotic was changed to cipro oral upon discharge - she has finished prednisone course, tonight is last cipro antibiotic. Did not need home oxygen upon discharge.   Actually feeling worse than when she was in the hospital. Endorses some chest discomfort, abdominal discomfort, back pain, persistent headaches. Endorses some palpitations as well. Improving coughing fits. Continues taking delsym. Intermittent diarrhea, currently taking fiber. No miralax since last Wednesday.  Headaches described as "tension headaches" in temples. No photo/phonophobia, no nausea. Also has sinus pressure which doesn't worsen with bending forward. Denies vision changes. Finds temples are tender to the touch. Didn't find prednisone really helped headaches, had to take tylenol for headaches.  Records reviewed. TnI normal. Discharge Cr 1.05. WBC 12.2, Hgb 13.2. BNP 18.   Head CT obtained today was normal.  DATE OF ADMISSION: 12/25/2014 DATE OF DISCHARGE: 12/27/2014 12:00 PM F/u phone call: 12/28/2014  Admission Dx: Hypoxemia [R09.02] Dyspnea on exertion [R06.09] Acute bronchitis, unspecified organism [J20.9] D/C Dx: Principal Problem:  Acute respiratory insufficiency Acute bronchitis  Relevant past medical, surgical, family and social history reviewed and  updated as indicated. Interim medical history since our last visit reviewed. Allergies and medications reviewed and updated. Current Outpatient Prescriptions on File Prior to Visit  Medication Sig  . acetaminophen (TYLENOL) 500 MG tablet Take 1,000 mg by mouth every 6 (six) hours as needed for mild pain or headache.  . albuterol (PROVENTIL HFA;VENTOLIN HFA) 108 (90 BASE) MCG/ACT inhaler Inhale 2 puffs into the lungs every 6 (six) hours as needed for wheezing or shortness of breath.  Marland Kitchen alendronate (FOSAMAX) 70 MG tablet Take 70 mg by mouth once a week. Patient takes every Thursday  . ALPRAZolam (XANAX) 0.25 MG tablet Take 0.125 mg by mouth 2 (two) times daily as needed for anxiety.  . Ascorbic Acid (VITAMIN C) 1000 MG tablet Take 1,000 mg by mouth daily.  Marland Kitchen aspirin EC 81 MG tablet Take 81 mg by mouth daily.  Marland Kitchen atorvastatin (LIPITOR) 40 MG tablet Take 1 tablet (40 mg total) by mouth every other day.  . B Complex Vitamins (VITAMIN B COMPLEX PO) Take 1 capsule by mouth daily.  . Calcium Carbonate-Vitamin D (CALCIUM + D PO) Take 1 tablet by mouth daily.  . carvedilol (COREG) 12.5 MG tablet Take 1 tablet (12.5 mg total) by mouth 2 (two) times daily with a meal.  . ciprofloxacin (CIPRO) 250 MG tablet Take 1 tablet (250 mg total) by mouth 2 (two) times daily.  . ferrous sulfate 325 (65 FE) MG tablet Take 325 mg by mouth every other day.  . mometasone (ELOCON) 0.1 % lotion Apply 1 application topically as needed (for eczema).  Marland Kitchen omeprazole (PRILOSEC) 40 MG capsule Take 1 capsule (40 mg total) by mouth 2 (two) times daily before  a meal.  . polyethylene glycol (MIRALAX / GLYCOLAX) packet Take 17 g by mouth daily as needed for mild constipation.   . predniSONE (DELTASONE) 20 MG tablet Take 1 tablet (20 mg total) by mouth daily with breakfast.  . Probiotic Product (PROBIOTIC DAILY) CAPS Take 1 capsule by mouth daily.  . Pyridoxine HCl (VITAMIN B6 PO) Take 1 capsule by mouth daily.  . sertraline (ZOLOFT) 25  MG tablet Take 1 tablet (25 mg total) by mouth daily. (Patient taking differently: Take 12.5 mg by mouth 2 (two) times daily. )  . spironolactone (ALDACTONE) 25 MG tablet Take 25 mg by mouth daily.   Marland Kitchen triamcinolone (NASACORT) 55 MCG/ACT AERO nasal inhaler Place 2 sprays into the nose 2 (two) times daily.  . vitamin E 100 UNIT capsule Take 100 Units by mouth daily.  . Wheat Dextrin (BENEFIBER DRINK MIX PO) Take 1 packet by mouth daily. Pt takes [facility] Natural Fiber  . [DISCONTINUED] bisoprolol (ZEBETA) 5 MG tablet Take 5 mg by mouth daily.   No current facility-administered medications on file prior to visit.    Review of Systems Per HPI unless specifically indicated above     Objective:    BP 122/80 mmHg  Pulse 82  Temp(Src) 98.2 F (36.8 C) (Oral)  Wt 172 lb (78.019 kg)  SpO2 99%  Wt Readings from Last 3 Encounters:  12/31/14 172 lb (78.019 kg)  12/26/14 170 lb 9.6 oz (77.384 kg)  12/25/14 185 lb (83.915 kg)    Physical Exam  Constitutional: She appears well-developed and well-nourished. No distress.  HENT:  Head: Normocephalic and atraumatic.  Right Ear: Hearing, tympanic membrane, external ear and ear canal normal.  Left Ear: Hearing, tympanic membrane, external ear and ear canal normal.  Nose: Mucosal edema present. No rhinorrhea. Right sinus exhibits no maxillary sinus tenderness and no frontal sinus tenderness. Left sinus exhibits no maxillary sinus tenderness and no frontal sinus tenderness.  Mouth/Throat: Uvula is midline, oropharynx is clear and moist and mucous membranes are normal. No oropharyngeal exudate, posterior oropharyngeal edema, posterior oropharyngeal erythema or tonsillar abscesses.  Scab in right external ear canal Mucosal injection  Eyes: Conjunctivae and EOM are normal. Pupils are equal, round, and reactive to light. No scleral icterus.  Neck: Normal range of motion. Neck supple. No thyromegaly present.  Cardiovascular: Normal rate, regular rhythm, normal  heart sounds and intact distal pulses.   No murmur heard. Pulmonary/Chest: Effort normal and breath sounds normal. No respiratory distress. She has no wheezes. She has no rales.  Abdominal: Soft. Bowel sounds are normal. She exhibits no distension and no mass. There is no hepatosplenomegaly. There is generalized tenderness (marked). There is positive Murphy's sign. There is no rigidity, no rebound and no guarding.  Musculoskeletal: She exhibits no edema.  Lymphadenopathy:    She has no cervical adenopathy.  Skin: Skin is warm and dry. No rash noted.  Nursing note and vitals reviewed.  Results for orders placed or performed during the hospital encounter of 12/25/14  CBC with Differential  Result Value Ref Range   WBC 12.2 (H) 3.6 - 11.0 K/uL   RBC 4.48 3.80 - 5.20 MIL/uL   Hemoglobin 13.2 12.0 - 16.0 g/dL   HCT 40.1 35.0 - 47.0 %   MCV 89.4 80.0 - 100.0 fL   MCH 29.4 26.0 - 34.0 pg   MCHC 32.9 32.0 - 36.0 g/dL   RDW 13.8 11.5 - 14.5 %   Platelets 242 150 - 440 K/uL   Neutrophils  Relative % 66 %   Neutro Abs 8.1 (H) 1.4 - 6.5 K/uL   Lymphocytes Relative 23 %   Lymphs Abs 2.8 1.0 - 3.6 K/uL   Monocytes Relative 9 %   Monocytes Absolute 1.1 (H) 0.2 - 0.9 K/uL   Eosinophils Relative 1 %   Eosinophils Absolute 0.2 0 - 0.7 K/uL   Basophils Relative 1 %   Basophils Absolute 0.1 0 - 0.1 K/uL  Brain natriuretic peptide  Result Value Ref Range   B Natriuretic Peptide 18.0 0.0 - 100.0 pg/mL  Blood gas, arterial  Result Value Ref Range   FIO2 0.21    pH, Arterial 7.41 7.350 - 7.450   pCO2 arterial 40 32.0 - 48.0 mmHg   pO2, Arterial 78 (L) 83.0 - 108.0 mmHg   Bicarbonate 25.4 21.0 - 28.0 mEq/L   Acid-Base Excess 0.7 0.0 - 3.0 mmol/L   O2 Saturation 95.5 %   Patient temperature 37.0    Collection site RIGHT RADIAL    Sample type ARTERIAL DRAW    Allens test (pass/fail) PASS PASS  Comprehensive metabolic panel  Result Value Ref Range   Sodium 140 135 - 145 mmol/L   Potassium 4.1  3.5 - 5.1 mmol/L   Chloride 105 101 - 111 mmol/L   CO2 25 22 - 32 mmol/L   Glucose, Bld 100 (H) 65 - 99 mg/dL   BUN 18 6 - 20 mg/dL   Creatinine, Ser 1.05 (H) 0.44 - 1.00 mg/dL   Calcium 9.8 8.9 - 10.3 mg/dL   Total Protein 8.2 (H) 6.5 - 8.1 g/dL   Albumin 4.3 3.5 - 5.0 g/dL   AST 32 15 - 41 U/L   ALT 23 14 - 54 U/L   Alkaline Phosphatase 118 38 - 126 U/L   Total Bilirubin 0.4 0.3 - 1.2 mg/dL   GFR calc non Af Amer 53 (L) >60 mL/min   GFR calc Af Amer >60 >60 mL/min   Anion gap 10 5 - 15  Troponin I  Result Value Ref Range   Troponin I <0.03 <0.031 ng/mL   CHEST - 2 VIEW COMPARISON: None. FINDINGS: The heart size is normal. Emphysematous changes are again noted. No focal airspace disease is present. An AICD wire is in place. IMPRESSION: Emphysema No acute could pulmonary disease or significant interval change. Electronically Signed  By: San Morelle M.D.  On: 12/25/2014 19:10    Assessment & Plan:   Problem List Items Addressed This Visit    Headache    Not consistent with migraine or TTH or sinus pressure headache. With description of pain, some concern for GCA, but given recent bronchitis and prednisone use don't think ESR would be helpful at current time. (Also, HA did not improve with recent prednisone course as would have been expected with GCA dx). Pt seems overall well today - will return in 1 wk to check ESR to further eval for GCA.  Update me sooner in interim if recurring or worsening headaches.      Relevant Orders   Sedimentation rate   Diarrhea    Worsened, after recent hospitalization and recent abx use. Check C diff. With associated abd pain check CMP, CBC today. (some degree of abd pain and diarrhea ongoing since anastomosis colon surgery earlier this year.)      Relevant Orders   Comprehensive metabolic panel   CBC with Differential/Platelet   C. difficile GDH and Toxin A/B   Acute bronchitis - Primary    This seems  to be resolving  well. Maintains O2 sats on ambulation today in office. Reassured doesn't seem she needs further abx.       Other Visit Diagnoses    Abdominal pain, generalized        Relevant Orders    Comprehensive metabolic panel    CBC with Differential/Platelet        Follow up plan: Return as needed.

## 2014-12-31 NOTE — Assessment & Plan Note (Addendum)
Worsened, after recent hospitalization and recent abx use. Check C diff. With associated abd pain check CMP, CBC today. (some degree of abd pain and diarrhea ongoing since anastomosis colon surgery earlier this year.)

## 2014-12-31 NOTE — Assessment & Plan Note (Addendum)
Not consistent with migraine or TTH or sinus pressure headache. With description of pain, some concern for GCA, but given recent bronchitis and prednisone use don't think ESR would be helpful at current time. (Also, HA did not improve with recent prednisone course as would have been expected with GCA dx). Pt seems overall well today - will return in 1 wk to check ESR to further eval for GCA.  Update me sooner in interim if recurring or worsening headaches.

## 2014-12-31 NOTE — Patient Instructions (Addendum)
I think bronchitis is improving - we will check ambulatory pulse ox today. Blood work and stool test today - pass by lab. Return in 1 week to check ESR (inflammatory marker) Good to see you today, call us with questions. Keep me updated with how you're feeling over weekend and early next week.

## 2014-12-31 NOTE — Assessment & Plan Note (Signed)
This seems to be resolving well. Maintains O2 sats on ambulation today in office. Reassured doesn't seem she needs further abx.

## 2014-12-31 NOTE — Progress Notes (Signed)
Pre visit review using our clinic review tool, if applicable. No additional management support is needed unless otherwise documented below in the visit note. 

## 2015-01-01 ENCOUNTER — Other Ambulatory Visit: Payer: Medicare Other

## 2015-01-01 LAB — CBC WITH DIFFERENTIAL/PLATELET
Basophils Absolute: 0.1 10*3/uL (ref 0.0–0.1)
Basophils Relative: 0.5 % (ref 0.0–3.0)
Eosinophils Absolute: 0.2 10*3/uL (ref 0.0–0.7)
Eosinophils Relative: 1.1 % (ref 0.0–5.0)
HCT: 38.7 % (ref 36.0–46.0)
Hemoglobin: 12.9 g/dL (ref 12.0–15.0)
Lymphocytes Relative: 28.7 % (ref 12.0–46.0)
Lymphs Abs: 4.7 10*3/uL — ABNORMAL HIGH (ref 0.7–4.0)
MCHC: 33.3 g/dL (ref 30.0–36.0)
MCV: 89.4 fl (ref 78.0–100.0)
Monocytes Absolute: 0.9 10*3/uL (ref 0.1–1.0)
Monocytes Relative: 5.7 % (ref 3.0–12.0)
Neutro Abs: 10.5 10*3/uL — ABNORMAL HIGH (ref 1.4–7.7)
Neutrophils Relative %: 64 % (ref 43.0–77.0)
Platelets: 249 10*3/uL (ref 150.0–400.0)
RBC: 4.33 Mil/uL (ref 3.87–5.11)
RDW: 13.4 % (ref 11.5–15.5)
WBC: 16.4 10*3/uL — ABNORMAL HIGH (ref 4.0–10.5)

## 2015-01-01 LAB — COMPREHENSIVE METABOLIC PANEL
ALT: 19 U/L (ref 0–35)
AST: 15 U/L (ref 0–37)
Albumin: 4.1 g/dL (ref 3.5–5.2)
Alkaline Phosphatase: 103 U/L (ref 39–117)
BUN: 23 mg/dL (ref 6–23)
CO2: 27 mEq/L (ref 19–32)
Calcium: 9.7 mg/dL (ref 8.4–10.5)
Chloride: 102 mEq/L (ref 96–112)
Creatinine, Ser: 1.15 mg/dL (ref 0.40–1.20)
GFR: 49.66 mL/min — ABNORMAL LOW (ref >60.00)
Glucose, Bld: 82 mg/dL (ref 70–99)
Potassium: 4.1 mEq/L (ref 3.5–5.1)
Sodium: 139 mEq/L (ref 135–145)
Total Bilirubin: 0.2 mg/dL (ref 0.2–1.2)
Total Protein: 7.4 g/dL (ref 6.0–8.3)

## 2015-01-02 ENCOUNTER — Other Ambulatory Visit: Payer: Self-pay | Admitting: Family Medicine

## 2015-01-02 DIAGNOSIS — J029 Acute pharyngitis, unspecified: Secondary | ICD-10-CM | POA: Diagnosis not present

## 2015-01-02 DIAGNOSIS — J018 Other acute sinusitis: Secondary | ICD-10-CM | POA: Diagnosis not present

## 2015-01-02 DIAGNOSIS — H9201 Otalgia, right ear: Secondary | ICD-10-CM | POA: Diagnosis not present

## 2015-01-05 ENCOUNTER — Telehealth: Payer: Self-pay | Admitting: Family Medicine

## 2015-01-05 NOTE — Telephone Encounter (Signed)
-----   Message from Marchia Bond sent at 12/31/2014  4:20 PM EDT ----- Regarding: 6 mo f/u labs 8/23, need orders please :-) Please order  future  6 mo DM f/u labs for pt's upcoming lab appt. Thanks Aniceto Boss

## 2015-01-11 ENCOUNTER — Ambulatory Visit (INDEPENDENT_AMBULATORY_CARE_PROVIDER_SITE_OTHER): Payer: Medicare Other | Admitting: Family Medicine

## 2015-01-11 ENCOUNTER — Encounter: Payer: Self-pay | Admitting: Family Medicine

## 2015-01-11 ENCOUNTER — Ambulatory Visit: Payer: Medicare Other | Admitting: Family Medicine

## 2015-01-11 VITALS — BP 110/64 | HR 88 | Temp 97.7°F | Wt 175.5 lb

## 2015-01-11 DIAGNOSIS — N183 Chronic kidney disease, stage 3 unspecified: Secondary | ICD-10-CM

## 2015-01-11 DIAGNOSIS — R197 Diarrhea, unspecified: Secondary | ICD-10-CM

## 2015-01-11 DIAGNOSIS — R1084 Generalized abdominal pain: Secondary | ICD-10-CM | POA: Diagnosis not present

## 2015-01-11 LAB — BASIC METABOLIC PANEL
BUN: 19 mg/dL (ref 6–23)
CO2: 26 mEq/L (ref 19–32)
Calcium: 9.4 mg/dL (ref 8.4–10.5)
Chloride: 104 mEq/L (ref 96–112)
Creatinine, Ser: 1.02 mg/dL (ref 0.40–1.20)
GFR: 57.03 mL/min — ABNORMAL LOW (ref >60.00)
Glucose, Bld: 97 mg/dL (ref 70–99)
Potassium: 3.9 mEq/L (ref 3.5–5.1)
Sodium: 138 mEq/L (ref 135–145)

## 2015-01-11 LAB — CBC WITH DIFFERENTIAL/PLATELET
Basophils Absolute: 0 10*3/uL (ref 0.0–0.1)
Basophils Relative: 0.4 % (ref 0.0–3.0)
Eosinophils Absolute: 0.1 10*3/uL (ref 0.0–0.7)
Eosinophils Relative: 1.3 % (ref 0.0–5.0)
HCT: 36.8 % (ref 36.0–46.0)
Hemoglobin: 12.2 g/dL (ref 12.0–15.0)
Lymphocytes Relative: 21.6 % (ref 12.0–46.0)
Lymphs Abs: 2.3 10*3/uL (ref 0.7–4.0)
MCHC: 33.1 g/dL (ref 30.0–36.0)
MCV: 89.9 fl (ref 78.0–100.0)
Monocytes Absolute: 0.9 10*3/uL (ref 0.1–1.0)
Monocytes Relative: 8.7 % (ref 3.0–12.0)
Neutro Abs: 7.3 10*3/uL (ref 1.4–7.7)
Neutrophils Relative %: 68 % (ref 43.0–77.0)
Platelets: 236 10*3/uL (ref 150.0–400.0)
RBC: 4.1 Mil/uL (ref 3.87–5.11)
RDW: 13.5 % (ref 11.5–15.5)
WBC: 10.8 10*3/uL — ABNORMAL HIGH (ref 4.0–10.5)

## 2015-01-11 LAB — LIPASE: Lipase: 42 U/L (ref 11.0–59.0)

## 2015-01-11 MED ORDER — METRONIDAZOLE 250 MG PO TABS: 250.0000 mg | ORAL_TABLET | Freq: Three times a day (TID) | ORAL | Status: DC

## 2015-01-11 NOTE — Patient Instructions (Addendum)
labwork today. Return C diff test today. Start flagyl 500mg  three times daily  We will call you with results.

## 2015-01-11 NOTE — Addendum Note (Signed)
Addended by: Ellamae Sia on: 01/11/2015 09:12 AM   Modules accepted: Orders

## 2015-01-11 NOTE — Addendum Note (Signed)
Addended by: Ellamae Sia on: 01/11/2015 04:06 PM   Modules accepted: Orders

## 2015-01-11 NOTE — Assessment & Plan Note (Signed)
Pt never returned C diff test - will collect today in office. WBC elevated to 16 last CBC then we were unable to contact patient. Will recheck CBC and start empiric flagyl 250mg  TID with meals while we await results (pt worried higher 500mg  dose was intolerable previously).  If marked elevated CBC - consider CT abd/pelvis.

## 2015-01-11 NOTE — Progress Notes (Signed)
BP 110/64 mmHg  Pulse 88  Temp(Src) 97.7 F (36.5 C) (Oral)  Wt 175 lb 8 oz (79.606 kg)   CC: f/u visit, bloody stools Subjective:    Patient ID: Ashley [name], female    DOB: 1945/10/30, 69 y.o.   MRN: [id number]  HPI: Ashley [name] is a 69 y.o. female presenting on 01/11/2015 for Follow-up and Rectal Bleeding   See prior note for details.  Last labwork showing elevated WBC 16.4. We were never able to get a hold of patient all last week - [place] and did not answer cel phone (put it on airplane mode all week becaues of bad reception).   Intermittent diarrhea with some blood in stool all last week - 8-12 times per day. Having some blood tinged stool even today.  Having intermittent LUQ, LLQ pain and L groin   No fevers. No dysuria. Cough/dyspnea has improved. Headaches have improved.  Relevant past medical, surgical, family and social history reviewed and updated as indicated. Interim medical history since our last visit reviewed. Allergies and medications reviewed and updated. Current Outpatient Prescriptions on File Prior to Visit  Medication Sig  . acetaminophen (TYLENOL) 500 MG tablet Take 1,000 mg by mouth every 6 (six) hours as needed for mild pain or headache.  . albuterol (PROVENTIL HFA;VENTOLIN HFA) 108 (90 BASE) MCG/ACT inhaler Inhale 2 puffs into the lungs every 6 (six) hours as needed for wheezing or shortness of breath. (Patient not taking: Reported on 01/11/2015)  . alendronate (FOSAMAX) 70 MG tablet Take 70 mg by mouth once a week. Patient takes every Thursday  . ALPRAZolam (XANAX) 0.25 MG tablet Take 0.125 mg by mouth 2 (two) times daily as needed for anxiety.  . Ascorbic Acid (VITAMIN C) 1000 MG tablet Take 1,000 mg by mouth daily.  Marland Kitchen aspirin EC 81 MG tablet Take 81 mg by mouth daily.  Marland Kitchen atorvastatin (LIPITOR) 40 MG tablet Take 1 tablet (40 mg total) by mouth every other day.  . B Complex Vitamins (VITAMIN B COMPLEX PO) Take 1 capsule by  mouth daily.  . Calcium Carbonate-Vitamin D (CALCIUM + D PO) Take 1 tablet by mouth daily.  . carvedilol (COREG) 12.5 MG tablet Take 1 tablet (12.5 mg total) by mouth 2 (two) times daily with a meal.  . ferrous sulfate 325 (65 FE) MG tablet Take 325 mg by mouth every other day.  . mometasone (ELOCON) 0.1 % lotion Apply 1 application topically as needed (for eczema).  Marland Kitchen omeprazole (PRILOSEC) 40 MG capsule TAKE 1 CAPSULE (40 MG TOTAL) BY MOUTH 2 (TWO) TIMES DAILY BEFORE A MEAL.  Marland Kitchen polyethylene glycol (MIRALAX / GLYCOLAX) packet Take 17 g by mouth daily as needed for mild constipation.   . Probiotic Product (PROBIOTIC DAILY) CAPS Take 1 capsule by mouth daily.  . Pyridoxine HCl (VITAMIN B6 PO) Take 1 capsule by mouth daily.  . sertraline (ZOLOFT) 25 MG tablet Take 1 tablet (25 mg total) by mouth daily. (Patient taking differently: Take 12.5 mg by mouth 2 (two) times daily. )  . spironolactone (ALDACTONE) 25 MG tablet Take 25 mg by mouth daily.   Marland Kitchen triamcinolone (NASACORT) 55 MCG/ACT AERO nasal inhaler Place 2 sprays into the nose 2 (two) times daily.  . vitamin E 100 UNIT capsule Take 100 Units by mouth daily.  . Wheat Dextrin (BENEFIBER DRINK MIX PO) Take 1 packet by mouth daily. Pt takes [facility] Natural Fiber  . [DISCONTINUED] bisoprolol (ZEBETA) 5 MG tablet Take  5 mg by mouth daily.   No current facility-administered medications on file prior to visit.    Review of Systems Per HPI unless specifically indicated above     Objective:    BP 110/64 mmHg  Pulse 88  Temp(Src) 97.7 F (36.5 C) (Oral)  Wt 175 lb 8 oz (79.606 kg)  Wt Readings from Last 3 Encounters:  01/11/15 175 lb 8 oz (79.606 kg)  12/31/14 172 lb (78.019 kg)  12/26/14 170 lb 9.6 oz (77.384 kg)    Physical Exam  Constitutional: She appears well-developed and well-nourished. No distress.  does not seem in distress today, nontoxic  Cardiovascular: Normal rate, regular rhythm, normal heart sounds and intact distal pulses.     No murmur heard. Pulmonary/Chest: Effort normal and breath sounds normal. No respiratory distress. She has no wheezes. She has no rales.  Abdominal: Soft. Normal appearance and bowel sounds are normal. She exhibits no distension and no mass. There is no hepatosplenomegaly. There is generalized tenderness. There is no rigidity, no rebound, no guarding, no CVA tenderness and negative Murphy's sign.  Midline incision  Skin: Skin is warm and dry. No rash noted.  Psychiatric: She has a normal mood and affect.  Nursing note and vitals reviewed.  Results for orders placed or performed in visit on 12/31/14  Comprehensive metabolic panel  Result Value Ref Range   Sodium 139 135 - 145 mEq/L   Potassium 4.1 3.5 - 5.1 mEq/L   Chloride 102 96 - 112 mEq/L   CO2 27 19 - 32 mEq/L   Glucose, Bld 82 70 - 99 mg/dL   BUN 23 6 - 23 mg/dL   Creatinine, Ser 1.15 0.40 - 1.20 mg/dL   Total Bilirubin 0.2 0.2 - 1.2 mg/dL   Alkaline Phosphatase 103 39 - 117 U/L   AST 15 0 - 37 U/L   ALT 19 0 - 35 U/L   Total Protein 7.4 6.0 - 8.3 g/dL   Albumin 4.1 3.5 - 5.2 g/dL   Calcium 9.7 8.4 - 10.5 mg/dL   GFR 49.66 (L) >60.00 mL/min  CBC with Differential/Platelet  Result Value Ref Range   WBC 16.4 (H) 4.0 - 10.5 K/uL   RBC 4.33 3.87 - 5.11 Mil/uL   Hemoglobin 12.9 12.0 - 15.0 g/dL   HCT 38.7 36.0 - 46.0 %   MCV 89.4 78.0 - 100.0 fl   MCHC 33.3 30.0 - 36.0 g/dL   RDW 13.4 11.5 - 15.5 %   Platelets 249.0 150.0 - 400.0 K/uL   Neutrophils Relative % 64.0 43.0 - 77.0 %   Lymphocytes Relative 28.7 12.0 - 46.0 %   Monocytes Relative 5.7 3.0 - 12.0 %   Eosinophils Relative 1.1 0.0 - 5.0 %   Basophils Relative 0.5 0.0 - 3.0 %   Neutro Abs 10.5 (H) 1.4 - 7.7 K/uL   Lymphs Abs 4.7 (H) 0.7 - 4.0 K/uL   Monocytes Absolute 0.9 0.1 - 1.0 K/uL   Eosinophils Absolute 0.2 0.0 - 0.7 K/uL   Basophils Absolute 0.1 0.0 - 0.1 K/uL      Assessment & Plan:   Problem List Items Addressed This Visit    CKD (chronic kidney  disease) stage 3, GFR 30-59 ml/min   Diarrhea - Primary    Pt never returned C diff test - will collect today in office. WBC elevated to 16 last CBC then we were unable to contact patient. Will recheck CBC and start empiric flagyl 250mg  TID with meals while we  await results (pt worried higher 500mg  dose was intolerable previously).  If marked elevated CBC - consider CT abd/pelvis.      Relevant Orders   CBC with Differential/Platelet   Lipase   Basic metabolic panel       Follow up plan: No Follow-up on file.

## 2015-01-11 NOTE — Progress Notes (Signed)
Pre visit review using our clinic review tool, if applicable. No additional management support is needed unless otherwise documented below in the visit note. 

## 2015-01-12 LAB — C. DIFFICILE GDH AND TOXIN A/B
C. difficile GDH: NOT DETECTED
C. difficile Toxin A/B: NOT DETECTED

## 2015-01-16 ENCOUNTER — Other Ambulatory Visit: Payer: Self-pay | Admitting: Family Medicine

## 2015-01-17 NOTE — Telephone Encounter (Signed)
plz phone in. 

## 2015-01-19 DIAGNOSIS — H6980 Other specified disorders of Eustachian tube, unspecified ear: Secondary | ICD-10-CM | POA: Diagnosis not present

## 2015-01-19 DIAGNOSIS — H9209 Otalgia, unspecified ear: Secondary | ICD-10-CM | POA: Diagnosis not present

## 2015-01-19 DIAGNOSIS — R05 Cough: Secondary | ICD-10-CM | POA: Diagnosis not present

## 2015-01-19 DIAGNOSIS — M2660 Temporomandibular joint disorder, unspecified: Secondary | ICD-10-CM | POA: Diagnosis not present

## 2015-01-19 NOTE — Telephone Encounter (Signed)
Rx called in as directed.   

## 2015-01-20 DIAGNOSIS — Z1231 Encounter for screening mammogram for malignant neoplasm of breast: Secondary | ICD-10-CM | POA: Diagnosis not present

## 2015-01-28 ENCOUNTER — Other Ambulatory Visit: Payer: Self-pay | Admitting: Family Medicine

## 2015-01-29 DIAGNOSIS — M65352 Trigger finger, left little finger: Secondary | ICD-10-CM | POA: Diagnosis not present

## 2015-01-29 DIAGNOSIS — M65332 Trigger finger, left middle finger: Secondary | ICD-10-CM | POA: Diagnosis not present

## 2015-01-30 ENCOUNTER — Other Ambulatory Visit: Payer: Self-pay | Admitting: Cardiovascular Disease

## 2015-02-01 DIAGNOSIS — H9209 Otalgia, unspecified ear: Secondary | ICD-10-CM | POA: Diagnosis not present

## 2015-02-01 DIAGNOSIS — Z124 Encounter for screening for malignant neoplasm of cervix: Secondary | ICD-10-CM | POA: Diagnosis not present

## 2015-02-01 DIAGNOSIS — M542 Cervicalgia: Secondary | ICD-10-CM | POA: Diagnosis not present

## 2015-02-01 DIAGNOSIS — J387 Other diseases of larynx: Secondary | ICD-10-CM | POA: Diagnosis not present

## 2015-02-01 DIAGNOSIS — M810 Age-related osteoporosis without current pathological fracture: Secondary | ICD-10-CM | POA: Diagnosis not present

## 2015-02-01 DIAGNOSIS — M2660 Temporomandibular joint disorder, unspecified: Secondary | ICD-10-CM | POA: Diagnosis not present

## 2015-02-01 DIAGNOSIS — Z01419 Encounter for gynecological examination (general) (routine) without abnormal findings: Secondary | ICD-10-CM | POA: Diagnosis not present

## 2015-02-01 DIAGNOSIS — B373 Candidiasis of vulva and vagina: Secondary | ICD-10-CM | POA: Diagnosis not present

## 2015-02-02 DIAGNOSIS — W19XXXA Unspecified fall, initial encounter: Secondary | ICD-10-CM | POA: Diagnosis not present

## 2015-02-02 DIAGNOSIS — R51 Headache: Secondary | ICD-10-CM | POA: Diagnosis not present

## 2015-02-02 DIAGNOSIS — T148 Other injury of unspecified body region: Secondary | ICD-10-CM | POA: Diagnosis not present

## 2015-02-10 DIAGNOSIS — M2021 Hallux rigidus, right foot: Secondary | ICD-10-CM | POA: Diagnosis not present

## 2015-02-10 DIAGNOSIS — M7742 Metatarsalgia, left foot: Secondary | ICD-10-CM | POA: Diagnosis not present

## 2015-02-10 DIAGNOSIS — M722 Plantar fascial fibromatosis: Secondary | ICD-10-CM | POA: Diagnosis not present

## 2015-02-11 ENCOUNTER — Encounter: Payer: Medicare Other | Admitting: *Deleted

## 2015-02-11 ENCOUNTER — Telehealth: Payer: Self-pay | Admitting: Cardiology

## 2015-02-11 NOTE — Telephone Encounter (Signed)
LMOVM reminding pt to send remote transmission.   

## 2015-02-12 ENCOUNTER — Encounter: Payer: Self-pay | Admitting: Cardiology

## 2015-02-19 ENCOUNTER — Ambulatory Visit: Payer: Medicare Other | Admitting: Cardiovascular Disease

## 2015-02-19 ENCOUNTER — Telehealth: Payer: Self-pay | Admitting: *Deleted

## 2015-02-19 ENCOUNTER — Telehealth: Payer: Self-pay | Admitting: Internal Medicine

## 2015-02-19 DIAGNOSIS — R2689 Other abnormalities of gait and mobility: Secondary | ICD-10-CM | POA: Diagnosis not present

## 2015-02-19 NOTE — Telephone Encounter (Signed)
Left message on pt's vm w/ Dr. Gollan's recommendation. Asked her to call back w/ any questions or concerns.  

## 2015-02-19 NOTE — Telephone Encounter (Signed)
°  1. Has your device fired? No ° °2. Is you device beeping? No ° °3. Are you experiencing draining or swelling at device site? No ° °4. Are you calling to see if we received your device transmission? Yes ° °5. Have you passed out? No ° °

## 2015-02-19 NOTE — Telephone Encounter (Signed)
Called pt to rsch apt for today and she was okay with this, but she wanted to tell us some issues she has been having. She states this is been going on since end of may But when she bends over about 3 times she feels like she will pass out.  There were a time where she had got a total black out moment, but lay downed it was fine after wards She also mentions below her knee's, they are very "puffy" feeling like there is water in them This is also been going on for a few weeks She's been getting some "sharp pains in chest" (denies it now)  Also mentions she has headaches She's also getting numbness in her hands and feet  States this is been going on every single say for the past few weeks Has gone to PCP (they are in epic) and she is not sure what they think  Asked her when does the numbness happen she states when she is laying down, driving, going bathroom.  She says she has this eerie feeling that her heart is not pumping right and could be causing the numbness.  I have added her to the wait list and she is aware that if we have any openings we will call her and let her know. She is aware Dr [name] is in clinic today so it may take a while for [place] to get back with her on these issues above.  Please call with advise on this.

## 2015-02-19 NOTE — Telephone Encounter (Signed)
If she is fighting an infection, even having diarrhea, this can drop her blood pressure She will have to raise up slowly, may need to back down on her diuretic if she has lightheaded spells Would wear compression hose for the leg swelling for now during the daytime This could be from venous insufficiency

## 2015-02-22 ENCOUNTER — Ambulatory Visit: Payer: Medicare Other | Admitting: Family Medicine

## 2015-02-22 DIAGNOSIS — R2689 Other abnormalities of gait and mobility: Secondary | ICD-10-CM | POA: Diagnosis not present

## 2015-02-22 DIAGNOSIS — M2022 Hallux rigidus, left foot: Secondary | ICD-10-CM | POA: Diagnosis not present

## 2015-02-22 DIAGNOSIS — M7742 Metatarsalgia, left foot: Secondary | ICD-10-CM | POA: Diagnosis not present

## 2015-02-22 DIAGNOSIS — M2021 Hallux rigidus, right foot: Secondary | ICD-10-CM | POA: Diagnosis not present

## 2015-02-22 NOTE — Telephone Encounter (Signed)
Returned patient's call and explained that we have not received her scheduled  remote transmission.  Explained to patient how to setup her [facility] cellular adapter and how to manually send a transmission.  She moved transmitter from her bedroom to her kitchen to get closer to a window with cellular reception.  Successfully sent manual transmission.  Reviewed manual transmission.  Transmission stable, OptiVol stable, no alerts.  Patient states that she has had increase SOB "in the past few months" and states that "2 weeks ago, I blacked out four times before I got right.  I called the ambulance and they checked my numbers and said that I looked fine".  Patient states that during these "blacking out" episodes, she is always leaning over.  She states this has been occurring since her colon surgery "a few months ago".  Patient also states that she is concerned because her feet are numb and painful at times.  She states "all of the blood tests they ran a few months ago were normal".  When asked, patient states her blood pressures range from 106/58 to 132/70.  She has not checked it yet today.  She states that she called Dr. Donivan Scull office to get an appointment to see him and that the earliest she is able to see him is the end of November.  Advised patient to change position slowly and to follow Dr. Donivan Scull recommendations as her device is stable.  Explained to patient that Dr. Lovena Le will review transmission and that we will contact her if he has any additional recommendations at that time.  Encouraged patient to call Dr. Donivan Scull office if her symptoms worsen or if she has any non-device related questions or concerns.  Patient is appreciative of call and denies any additional questions or concerns at this time.

## 2015-02-22 NOTE — Telephone Encounter (Signed)
Cause of SOB is unclear as her optivol for her device is "stable"  "blacking out" could be from dehydration. Has she been drinking enough Would make sure she is not on diuretics Bending over may cut off her breath or even cut off flow back to the heart.  May need to avoid the bending over

## 2015-02-23 ENCOUNTER — Encounter: Payer: Self-pay | Admitting: Family Medicine

## 2015-02-23 ENCOUNTER — Encounter: Payer: Self-pay | Admitting: Nurse Practitioner

## 2015-02-23 ENCOUNTER — Ambulatory Visit (INDEPENDENT_AMBULATORY_CARE_PROVIDER_SITE_OTHER): Payer: Medicare Other | Admitting: Family Medicine

## 2015-02-23 ENCOUNTER — Other Ambulatory Visit: Payer: Self-pay | Admitting: Family Medicine

## 2015-02-23 ENCOUNTER — Ambulatory Visit (INDEPENDENT_AMBULATORY_CARE_PROVIDER_SITE_OTHER): Payer: Medicare Other | Admitting: Nurse Practitioner

## 2015-02-23 VITALS — BP 108/82 | HR 80 | Temp 97.5°F | Wt 176.5 lb

## 2015-02-23 VITALS — BP 117/71 | HR 80 | Ht 65.5 in | Wt 177.2 lb

## 2015-02-23 DIAGNOSIS — M150 Primary generalized (osteo)arthritis: Secondary | ICD-10-CM | POA: Diagnosis not present

## 2015-02-23 DIAGNOSIS — R55 Syncope and collapse: Secondary | ICD-10-CM | POA: Insufficient documentation

## 2015-02-23 DIAGNOSIS — R51 Headache: Secondary | ICD-10-CM | POA: Diagnosis not present

## 2015-02-23 DIAGNOSIS — M8949 Other hypertrophic osteoarthropathy, multiple sites: Secondary | ICD-10-CM

## 2015-02-23 DIAGNOSIS — I428 Other cardiomyopathies: Secondary | ICD-10-CM

## 2015-02-23 DIAGNOSIS — I10 Essential (primary) hypertension: Secondary | ICD-10-CM | POA: Diagnosis not present

## 2015-02-23 DIAGNOSIS — I429 Cardiomyopathy, unspecified: Secondary | ICD-10-CM | POA: Diagnosis not present

## 2015-02-23 DIAGNOSIS — R519 Headache, unspecified: Secondary | ICD-10-CM

## 2015-02-23 DIAGNOSIS — M159 Polyosteoarthritis, unspecified: Secondary | ICD-10-CM

## 2015-02-23 MED ORDER — SPIRONOLACTONE 25 MG PO TABS: 12.5000 mg | ORAL_TABLET | Freq: Every day | ORAL | Status: DC

## 2015-02-23 NOTE — Telephone Encounter (Signed)
Rx's called in as directed.  

## 2015-02-23 NOTE — Progress Notes (Signed)
BP 104/74 mmHg  Pulse 72  Temp(Src) 97.5 F (36.4 C) (Oral)  Wt 176 lb 8 oz (80.06 kg)   CC: f/u visit, malaise  Subjective:    Patient ID: Ashley [name], female    DOB: 02-28-1946, 69 y.o.   MRN: [id number]  HPI: Ashley [name] is a 69 y.o. female presenting on 02/23/2015 for Follow-up   Present for 1 mo f/u visit. Persistent headaches. endorses 4 syncopal episodes. Presyncope 4x last week - while leaning over working in the yard. Denies overexertion. All episodes happened within an hour. Ambulance was called, states vitals were stable. No chest pain or significant dyspnea. Clarified with patient - no true syncope or LOC, just felt like she was going to pass out. Persistent pounding headaches and intermittent gait imbalance despite normal head CT (12/2014). Denies dizziness. Rare palpitations not associated with presyncopal episode. She did start torsemide for knee swelling - advised to stop this.  Had heart monitor evaluated yesterday - "stable". Off torsemide, on spironolactone.   Feels she is staying well hydrated, only drinks 2 8oz glasses per day + other drinks.   Stays easily fatigued.  Ophtho - sees [facility].  Has f/u with [place] [facility] for joint pains (hands, knees and hips).   Complicated GI history s/p partial colectomy and colostomy s/p reversal in h/o diverticulitis - off miralax for last month. Has tried to increase fiber supplement in diet. Alternating diarrhea and mild constipation.   Off sertraline - denies depression. Does continue xanax 1/2 tab bid   Husband sick recently - completed zpack for URI sxs.   Relevant past medical, surgical, family and social history reviewed and updated as indicated. Interim medical history since our last visit reviewed. Allergies and medications reviewed and updated. Current Outpatient Prescriptions on File Prior to Visit  Medication Sig  . acetaminophen (TYLENOL) 500 MG tablet Take 1,000 mg by mouth every 6 (six) hours  as needed for mild pain or headache.  . alendronate (FOSAMAX) 70 MG tablet Take 70 mg by mouth once a week. Patient takes every Thursday  . ALPRAZolam (XANAX) 0.25 MG tablet Take 0.125 mg by mouth 2 (two) times daily as needed for anxiety.  . Ascorbic Acid (VITAMIN C) 1000 MG tablet Take 1,000 mg by mouth daily.  Marland Kitchen aspirin EC 81 MG tablet Take 81 mg by mouth daily.  Marland Kitchen atorvastatin (LIPITOR) 40 MG tablet Take 1 tablet (40 mg total) by mouth every other day.  . Calcium Carbonate-Vitamin D (CALCIUM + D PO) Take 1 tablet by mouth daily.  . carvedilol (COREG) 12.5 MG tablet Take 1 tablet (12.5 mg total) by mouth 2 (two) times daily with a meal.  . mometasone (ELOCON) 0.1 % lotion Apply 1 application topically as needed (for eczema).  Marland Kitchen omeprazole (PRILOSEC) 40 MG capsule TAKE ONE CAPSULE BY MOUTH TWICE A DAY BEFORE MEALS  . Probiotic Product (PROBIOTIC DAILY) CAPS Take 1 capsule by mouth daily.  . Pyridoxine HCl (VITAMIN B6 PO) Take 1 capsule by mouth daily.  Marland Kitchen spironolactone (ALDACTONE) 25 MG tablet Take 25 mg by mouth daily.   Marland Kitchen triamcinolone (NASACORT) 55 MCG/ACT AERO nasal inhaler Place 2 sprays into the nose 2 (two) times daily.  . vitamin E 100 UNIT capsule Take 100 Units by mouth daily.  . Wheat Dextrin (BENEFIBER DRINK MIX PO) Take 1 packet by mouth daily. Pt takes [facility] Natural Fiber  . zolpidem (AMBIEN) 5 MG tablet TAKE 1/2 TO 1 TABLET BY MOUTH AT BEDTIME  AS NEEDED  . albuterol (PROVENTIL HFA;VENTOLIN HFA) 108 (90 BASE) MCG/ACT inhaler Inhale 2 puffs into the lungs every 6 (six) hours as needed for wheezing or shortness of breath. (Patient not taking: Reported on 01/11/2015)  . B Complex Vitamins (VITAMIN B COMPLEX PO) Take 1 capsule by mouth daily.  . ferrous sulfate 325 (65 FE) MG tablet Take 325 mg by mouth every other day.  . fluticasone (FLONASE) 50 MCG/ACT nasal spray Place 2 sprays into both nostrils daily.  . polyethylene glycol (MIRALAX / GLYCOLAX) packet Take 17 g by mouth daily as  needed for mild constipation.   . [DISCONTINUED] bisoprolol (ZEBETA) 5 MG tablet Take 5 mg by mouth daily.   No current facility-administered medications on file prior to visit.    Review of Systems Per HPI unless specifically indicated above     Objective:    BP 104/74 mmHg  Pulse 72  Temp(Src) 97.5 F (36.4 C) (Oral)  Wt 176 lb 8 oz (80.06 kg)  Wt Readings from Last 3 Encounters:  02/23/15 176 lb 8 oz (80.06 kg)  01/11/15 175 lb 8 oz (79.606 kg)  12/31/14 172 lb (78.019 kg)    Physical Exam  Constitutional: She appears well-developed and well-nourished. No distress.  HENT:  Mouth/Throat: Oropharynx is clear and moist. No oropharyngeal exudate.  Eyes: Conjunctivae and EOM are normal. Pupils are equal, round, and reactive to light.  Neck: Normal range of motion. Neck supple. Carotid bruit is not present. No thyromegaly present.  Cardiovascular: Normal rate, regular rhythm, normal heart sounds and intact distal pulses.   No murmur heard. Pulmonary/Chest: Effort normal and breath sounds normal. No respiratory distress. She has no wheezes. She has no rales.  Musculoskeletal: She exhibits no edema.  Lymphadenopathy:    She has no cervical adenopathy.  Skin: Skin is warm and dry. No rash noted.  Psychiatric: She has a normal mood and affect.  Nursing note and vitals reviewed.  Results for orders placed or performed in visit on 01/11/15  CBC with Differential/Platelet  Result Value Ref Range   WBC 10.8 (H) 4.0 - 10.5 K/uL   RBC 4.10 3.87 - 5.11 Mil/uL   Hemoglobin 12.2 12.0 - 15.0 g/dL   HCT 36.8 36.0 - 46.0 %   MCV 89.9 78.0 - 100.0 fl   MCHC 33.1 30.0 - 36.0 g/dL   RDW 13.5 11.5 - 15.5 %   Platelets 236.0 150.0 - 400.0 K/uL   Neutrophils Relative % 68.0 43.0 - 77.0 %   Lymphocytes Relative 21.6 12.0 - 46.0 %   Monocytes Relative 8.7 3.0 - 12.0 %   Eosinophils Relative 1.3 0.0 - 5.0 %   Basophils Relative 0.4 0.0 - 3.0 %   Neutro Abs 7.3 1.4 - 7.7 K/uL   Lymphs Abs  2.3 0.7 - 4.0 K/uL   Monocytes Absolute 0.9 0.1 - 1.0 K/uL   Eosinophils Absolute 0.1 0.0 - 0.7 K/uL   Basophils Absolute 0.0 0.0 - 0.1 K/uL  Lipase  Result Value Ref Range   Lipase 42.0 11.0 - 59.0 U/L  Basic metabolic panel  Result Value Ref Range   Sodium 138 135 - 145 mEq/L   Potassium 3.9 3.5 - 5.1 mEq/L   Chloride 104 96 - 112 mEq/L   CO2 26 19 - 32 mEq/L   Glucose, Bld 97 70 - 99 mg/dL   BUN 19 6 - 23 mg/dL   Creatinine, Ser 1.02 0.40 - 1.20 mg/dL   Calcium 9.4 8.4 - 10.5  mg/dL   GFR 57.03 (L) >60.00 mL/min  C. difficile GDH and Toxin A/B  Result Value Ref Range   C. difficile GDH Not Detected    C. difficile Toxin A/B Not Detected    Source Stool       Assessment & Plan:   Problem List Items Addressed This Visit    Pre-syncope - Primary    Describes period of time last week with several episodes of presyncope, no LOC. Recent echocardiogram stable, recent blood work stable.  Anticipate related to exertion while working in the yard and possible mild dehydration.  Will check carotid US to complete presyncope workup. RTC 2 mo medicare wellness visit and further labwork then.  Advised let us know right away if any persistent presyncope or new syncope.      Relevant Medications   torsemide (DEMADEX) 20 MG tablet   Other Relevant Orders   Carotid   Osteoarthritis    Discussed ok to take tylenol ES in am daily.      Headache    Persistent pounding headache with normal recent head CT. Reviewed this with patient. If persistent trouble, check ESR to further eval for GCA.            Follow up plan: Return in about 2 months (around 04/25/2015), or as needed, for medicare wellness visit.

## 2015-02-23 NOTE — Progress Notes (Signed)
Pre visit review using our clinic review tool, if applicable. No additional management support is needed unless otherwise documented below in the visit note. 

## 2015-02-23 NOTE — Patient Instructions (Addendum)
Medication Instructions:  Your physician has recommended you make the following change in your medication:  DECREASE sprionolactone to 12.5mg  once per day  Labwork: none  Testing/Procedures: Your physician has requested that you have an echocardiogram. Echocardiography is a painless test that uses sound waves to create images of your heart. It provides your doctor with information about the size and shape of your heart and how well your heart's chambers and valves are working. This procedure takes approximately one hour. There are no restrictions for this procedure.  Ultra sound of carotids appt _______________________________  Follow-Up: Keep your follow up appt on 11/21 with Dr. [name]  Any Other Special Instructions Will Be Listed Below (If Applicable).  Echocardiogram An echocardiogram, or echocardiography, uses sound waves (ultrasound) to produce an image of your heart. The echocardiogram is simple, painless, obtained within a short period of time, and offers valuable information to your health care provider. The images from an echocardiogram can provide information such as:  Evidence of coronary artery disease (CAD).  Heart size.  Heart muscle function.  Heart valve function.  Aneurysm detection.  Evidence of a past heart attack.  Fluid buildup around the heart.  Heart muscle thickening.  Assess heart valve function. LET [facility] CARE PROVIDER KNOW ABOUT:  Any allergies you have.  All medicines you are taking, including vitamins, herbs, eye drops, creams, and over-the-counter medicines.  Previous problems you or members of your family have had with the use of anesthetics.  Any blood disorders you have.  Previous surgeries you have had.  Medical conditions you have.  Possibility of pregnancy, if this applies. BEFORE THE PROCEDURE  No special preparation is needed. Eat and drink normally.  PROCEDURE   In order to produce an image of your heart, gel will  be applied to your chest and a wand-like tool (transducer) will be moved over your chest. The gel will help transmit the sound waves from the transducer. The sound waves will harmlessly bounce off your heart to allow the heart images to be captured in real-time motion. These images will then be recorded.  You may need an IV to receive a medicine that improves the quality of the pictures. AFTER THE PROCEDURE You may return to your normal schedule including diet, activities, and medicines, unless your health care provider tells you otherwise.   This information is not intended to replace advice given to you by your health care provider. Make sure you discuss any questions you have with your health care provider.   Document Released: 04/28/2000 Document Revised: 05/22/2014 Document Reviewed: 01/06/2013 Elsevier Interactive Patient Education [employer].

## 2015-02-23 NOTE — Assessment & Plan Note (Signed)
Describes period of time last week with several episodes of presyncope, no LOC. Recent echocardiogram stable, recent blood work stable.  Anticipate related to exertion while working in the yard and possible mild dehydration.  Will check carotid US to complete presyncope workup. RTC 2 mo medicare wellness visit and further labwork then.  Advised let us know right away if any persistent presyncope or new syncope.

## 2015-02-23 NOTE — Assessment & Plan Note (Signed)
Persistent pounding headache with normal recent head CT. Reviewed this with patient. If persistent trouble, check ESR to further eval for GCA.

## 2015-02-23 NOTE — Telephone Encounter (Signed)
Pt has appt w/ PCP today.  

## 2015-02-23 NOTE — Telephone Encounter (Signed)
plz phone in. 

## 2015-02-23 NOTE — Assessment & Plan Note (Signed)
Discussed ok to take tylenol ES in am daily.

## 2015-02-23 NOTE — Telephone Encounter (Signed)
Ok to refill 

## 2015-02-23 NOTE — Progress Notes (Signed)
Patient Name: Ashley [name] Date of Encounter: 02/23/2015  Primary Care Provider:  [name], MD Primary Cardiologist:  Johnny Bridge, MD   Chief Complaint  69 y/o female with a h/o NICM who presents today for eval 2/2 presyncope.  Past Medical History   Past Medical History  Diagnosis Date  . Migraine without aura, without mention of intractable migraine without mention of status migrainosus   . History of diverticulitis of colon   . Pure hypercholesterolemia   . Irritable bowel syndrome   . Laryngeal nodule     Dr. Thomasena Edis  . Fibrocystic breast disease   . Hemorrhoids   . Anxiety   . GERD (gastroesophageal reflux disease)   . Chronic systolic CHF (congestive heart failure) (HCC)     a. EF 25-35%, LV dilation, significant MR, goal weight 150lbs;  b. 04/2014 Echo: EF 50-55%, Gr 1 DD.  Marland Kitchen Nonischemic cardiomyopathy ([place])     a. Recovered LV fxn - prev as low as 25%, now 50-55% by echo 04/2014;  b. 2012 s/p MDT [id number] [place] VR single lead ICD.  Marland Kitchen History of pneumonia 2012  . Mitral regurgitation   . Anxiety   . Bronchiectasis     a. possibly mild, treat URIs aggressively  . Essential hypertension   . Chest pain     a. prev neg MV.  . Lung disease 07/14/11  . Anemia   . Fibromyalgia   . Depression   . Disorder of vocal cords   . Chronic sinusitis   . Insomnia   . Osteoporosis 01/2013  . sigmoid diverticulitis with perforation, abscess and fistula 09/30/2013  . H/O multiple allergies 10/10/2013  . PONV (postoperative nausea and vomiting)   . Pneumonia 2011  . Automatic implantable cardioverter-defibrillator in situ 2012    a. MDT single lead ICD.  Marland Kitchen Osteoarthritis     knees, back, hands, low back,   . Cancer (HCC)     basal cell, arms & neck  . Sleep apnea 2010    borderline, inconclusive- /w [place]   . Hemorrhoids   . Diarrhea 07/14/2008    Qualifier: Diagnosis of  By: Glori Bickers MD, Carmell Austria   . Allergy   . Pre-syncope    Past Surgical History    Procedure Laterality Date  . Appendectomy    . Mandible surgery      "got about 6 pins in the bottom of my jaw"  . Knee arthroscopy w/ orif      Left; "meniscus tear"  . Osteotomy      Left foot  . 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 Dexa  03/28/99    osteopenia L/S  . Colonoscopy  1998    nml (sigmoid-Gilbert-nol)  . Head mri, head ct  10/1998    ? H/A focus ([place])  . Emg/mcv  10/16/01    + mild carpal tunnel  . Dexa  03/12/03    2.2 Spine 0.1 Hip  Osteopenia  . Mri right hip  12/12/01    Tendonitis Gluteus Medius to Gtr Trochanter Neg Bursitis (Dr. Eulas Post)  . L/s films  11/21/01    nml; Right hip nml  . Mri u/s  11/21/01    min. DDD L/3-4  . Colonoscopy  01/04/04    polyps, diverticulosis/severe  . Dexa  11/14/05    1.9 Spine 0.3 Hip  slight improvement  . Flex laryngoscopy  06/11/06    ([place]) nml  . Dexa  11/22/06    improved osteo and fem neck  . [place] bunionectomy  11/04/08    Right Great Toe (Dr. Beola Cord)  . Breast mass excision  01/2010    Left-fibrocystic change w/intraductal papilloma, no malignancy  . Ct chest  12/2010    possible bronchiectasis lower lobes, 02/2011 - enlarged bilateral effusions, bibasilar atx  . Stress myoview  02/2011    no significant ischemia  . Ct chest  04/2011    no PE.  abnormal R hilar and mediastinal adenopathy --> on rpt 05/2011, resolved  . US echocardiography  04/2011    mildly dilated LV, EF 25-30%, hypokinesis, restrictive physiology, mod MR, no AS  . Icd placement  07/14/11    w/pacemaker  . Tonsillectomy and adenoidectomy    . Breast biopsy      "I've had 2-3 bx; all benign"  . Augmentation mammaplasty    . 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 Abdominal hysterectomy  1987    w/BSO  . Lasik      left eye  . Knee surgery      left  . Mandible surgery    . Sinus surgery      x3 with balloon   . Dexa  01/2013    T score -2.6 at spine  . Partial colectomy N/A 10/02/2013     PARTIAL COLECTOMY;  Surgeon: Imogene Burn. Georgette Dover, MD  . Colostomy N/A 10/02/2013    DESCENDING COLOSTOMY;  Surgeon: Imogene Burn. Georgette Dover, MD  . Colostomy reversal N/A 02/10/2014    [name], MD  . Implantable cardioverter defibrillator implant N/A 07/14/2011    IMPLANTABLE CARDIOVERTER DEFIBRILLATOR IMPLANT;  Surgeon: Evans Lance, MD;  Location: [facility] CATH LAB;  Service: Cardiovascular;  Laterality: N/A;  . Esophagogastroduodenoscopy  09/2014    small HH, irregular Z line, fundic gland polyps with mild gastritis ([place])  . Ncs  11/2014    L ulnar neuropathy, mild B median nerve entrapment ([place])    Allergies  Allergies  Allergen Reactions  . Antihistamines, Chlorpheniramine-Type Other (See Comments)    Makes her eyes look like she sees strobe lights  . Codeine Other (See Comments)    Confusion and dizziness  . Cyclobenzaprine Other (See Comments)    Adverse reaction - back and throat pain, dizziness, exhaustion  . Cymbalta [Duloxetine Hcl] Other (See Comments)    "body spasms and made me feel weird in my head"   . Influenza Vac Split [Flu Virus Vaccine] Other (See Comments)    "allergic to concentrated eggs that are in vaccine"  . Penicillins Other (See Comments)    REACTION: "told 50 years ago I couldn't take it; might be immune to it", able to take amoxicillin  . Sulfasalazine Other (See Comments)    "makes my whole body smell like sulfa & makes me sick just smelling it"  . Atorvastatin Other (See Comments)    Severe muscle cramps to generic atorvastatin.  Able to take brand lipitor.  . Chlorhexidine Other (See Comments)    blisters  . Dilaudid [Hydromorphone Hcl] Other (See Comments)    Feels like she is burning internally   . Latex Nausea Only, Rash and Other (See Comments)    Pain and infection  . Morphine And Related Other (See Comments)    Internally feels like she is on fire   . Pravastatin Other (See Comments)    myalgias  . Prevacid [Lansoprazole]  Other (See Comments)      Worsened GI side effects  . Red Dye Nausea Only and Swelling  . Rosuvastatin Other (See Comments)    Was not effective controlling lipids  . Simvastatin Other (See Comments)    Leg cramps  . Other     NOTE: pt is able to take cephalosporins without reaction  . Tape Other (See Comments)    All adhesives  - Blisters, itching and burning   . Metaxalone Other (See Comments)    REACTION: unknown    HPI  69 y/o female with the above complex PMH/surgical hx and allergies.  She has a h/o NICM with recovered LV fxn with an EF of 50-55% by echo in 04/2014.  She says that since her abdominal surgery in 01/2014, she hasn't really gotten fully back into the swing of things.  She notes deconditioning and dyspnea on exertion but stable weight and no edema, pnd, orthopnea, n, v, early satiety, or chest pain.  She no longer uses prn torsemide.  She does have intermittent periods of lightheadedness and a few wks ago, while working in the yard, she had 4 spells of presyncope that occurred while standing or sitting, all w/in about an hour.  She describes these spells as "blacking out" though she notes that she never lost consciousness - just felt that her vision was blackening.  Her ICD was remotely evaluated and no events were noted.  She was seen by her PCP today and he recommended carotid u/s, which she thought she'd have performed by our office today, though a clinic visit was scheduled, not an u/s.  Home Medications  Prior to Admission medications   Medication Sig Start Date End Date Taking? Authorizing Provider  acetaminophen (TYLENOL) 500 MG tablet Take 500 mg by mouth daily.    Yes Historical Provider, MD  alendronate (FOSAMAX) 70 MG tablet Take 70 mg by mouth once a week. Patient takes every Thursday   Yes Historical Provider, MD  ALPRAZolam Duanne Moron) 0.25 MG tablet TAKE 1 TABLET TWICE A DAY AS NEEDED FOR ANXIETY 02/23/15  Yes [name], MD  Ascorbic Acid (VITAMIN C) 1000 MG tablet Take 1,000  mg by mouth daily.   Yes Historical Provider, MD  aspirin EC 81 MG tablet Take 81 mg by mouth daily.   Yes Historical Provider, MD  atorvastatin (LIPITOR) 40 MG tablet TAKE 1 TABLET (40 MG TOTAL) BY MOUTH EVERY OTHER DAY. 02/23/15  Yes [name], MD  Calcium Carbonate-Vitamin D (CALCIUM + D PO) Take 1 tablet by mouth daily.   Yes Historical Provider, MD  carvedilol (COREG) 12.5 MG tablet Take 1 tablet (12.5 mg total) by mouth 2 (two) times daily with a meal. 06/12/14  Yes Minna Merritts, MD  ferrous sulfate 325 (65 FE) MG tablet Take 325 mg by mouth every other day.   Yes Historical Provider, MD  mometasone (ELOCON) 0.1 % lotion Apply 1 application topically as needed (for eczema).   Yes Historical Provider, MD  omeprazole (PRILOSEC) 40 MG capsule TAKE ONE CAPSULE BY MOUTH TWICE A DAY BEFORE MEALS 01/28/15  Yes [name], MD  polyethylene glycol (MIRALAX / GLYCOLAX) packet Take 17 g by mouth daily as needed for mild constipation.    Yes Historical Provider, MD  Probiotic Product (PROBIOTIC DAILY) CAPS Take 1 capsule by mouth daily.   Yes Historical Provider, MD  Pyridoxine HCl (VITAMIN B6 PO) Take 1 capsule by mouth daily.   Yes Historical Provider, MD  triamcinolone (NASACORT)  55 MCG/ACT AERO nasal inhaler Place 2 sprays into the nose 2 (two) times daily.   Yes Historical Provider, MD  vitamin E 100 UNIT capsule Take 100 Units by mouth daily.   Yes Historical Provider, MD  Wheat Dextrin (BENEFIBER DRINK MIX PO) Take 1 packet by mouth daily. Pt takes [facility] Natural Fiber   Yes Historical Provider, MD  zolpidem (AMBIEN) 5 MG tablet TAKE 1/2 TO 1 TABLET BY MOUTH AT BEDTIME AS NEEDED 02/23/15  Yes [name], MD  spironolactone (ALDACTONE) 25 MG tablet Take 0.5 tablets (12.5 mg total) by mouth daily. 02/23/15   Rogelia Mire, NP    Review of Systems  Chronic DOE. Intermittent presyncope as outlined above.  Feels like her energy level has not returned to baseline since her  abdominal surgery in 01/2014.  She has intermittent sharp/fleeting pains in her left neck, around her pacemaker, and in the epigastric area w/o rhyme or reason.  All other systems reviewed and are otherwise negative except as noted above.  Physical Exam  VS:  BP 117/71 mmHg  Pulse 80  Ht 5' 5.5" (1.664 m)  Wt 177 lb 4 oz (80.4 kg)  BMI 29.04 kg/m2 , BMI Body mass index is 29.04 kg/(m^2).  Orthostatic VS: lying: 135/72, HR 84; sitting 108/69, HR 86; Standing (0 mins) 126/71, HR 92 - c/o dizziness; Standing (3 mins) 113/72, HR 90. GEN: Well nourished, well developed, in no acute distress. HEENT: normal. Neck: Supple, no JVD, carotid bruits, or masses. Cardiac: RRR, no murmurs, rubs, or gallops. No clubbing, cyanosis, edema.  Radials/DP/PT 2+ and equal bilaterally.  Respiratory:  Respirations regular and unlabored, clear to auscultation bilaterally. GI: Soft, nontender, nondistended, BS + x 4. MS: no deformity or atrophy. Skin: warm and dry, no rash. Neuro:  Strength and sensation are intact. Psych: Normal affect.  Accessory Clinical Findings  ECG - RSR, 80, LAD, no acute st/t changes.  Assessment & Plan  1.  Pre-syncope:  Pt had multiple episodes of presyncope presyncope a few wks ago.  ICD was remotely evaluated and did not show any tachy/brady arrhythmias with nl optivol.  Ss seem to be worse with stooping followed by standing.  She was mildly orthostatic by BP and HR today associated with dizziness.  I have asked her to reduce her spironolactone to 12.5 mg daily and to be sure to change positions more slowly.  She will be scheduled for a carotid u/s, which was ordered by her PCP earlier today.  If Ss persist, she may ultimately benefit from thigh high stockings vs an abd binder.  2.  H/O NICM:  Euvolemic on exam.  Last echo 04/2014 showed nl EF.  She has chronic DOE and is concerned that her EF may have dropped though her wt has been stable and optivol readings last week were reassuring.   She would like a repeat echo.  We can obtain this at the time of her carotid u/s.  Cont coreg.  Reducing spiro to 12.5 daily in light of mild orthostasis and presyncope.  3.  Essential HTN/Orthostasis:  See above.  Reducing spiro to 12.5 daily.  4.  Dispo:  F/u Dr. [name] as scheduled on 11/21 or sooner if necessary.    Murray Hodgkins, NP 02/23/2015, 4:17 PM

## 2015-02-23 NOTE — Patient Instructions (Addendum)
Restart iron a few times a week. Continue to hold torsemide unless weight starts increasing.  Schedule follow up with eye doctor  Recent head CT was reassuring. We will check carotid ultrasound.  Return in 2-3 months for medicare wellness visit.

## 2015-02-24 DIAGNOSIS — R2689 Other abnormalities of gait and mobility: Secondary | ICD-10-CM | POA: Diagnosis not present

## 2015-02-26 DIAGNOSIS — M1812 Unilateral primary osteoarthritis of first carpometacarpal joint, left hand: Secondary | ICD-10-CM | POA: Diagnosis not present

## 2015-02-26 DIAGNOSIS — M65352 Trigger finger, left little finger: Secondary | ICD-10-CM | POA: Diagnosis not present

## 2015-02-26 DIAGNOSIS — M65332 Trigger finger, left middle finger: Secondary | ICD-10-CM | POA: Diagnosis not present

## 2015-03-01 DIAGNOSIS — R2689 Other abnormalities of gait and mobility: Secondary | ICD-10-CM | POA: Diagnosis not present

## 2015-03-08 DIAGNOSIS — R2689 Other abnormalities of gait and mobility: Secondary | ICD-10-CM | POA: Diagnosis not present

## 2015-03-10 DIAGNOSIS — R2689 Other abnormalities of gait and mobility: Secondary | ICD-10-CM | POA: Diagnosis not present

## 2015-03-12 DIAGNOSIS — M170 Bilateral primary osteoarthritis of knee: Secondary | ICD-10-CM | POA: Diagnosis not present

## 2015-03-12 DIAGNOSIS — M1712 Unilateral primary osteoarthritis, left knee: Secondary | ICD-10-CM | POA: Diagnosis not present

## 2015-03-12 DIAGNOSIS — M1711 Unilateral primary osteoarthritis, right knee: Secondary | ICD-10-CM | POA: Diagnosis not present

## 2015-03-12 DIAGNOSIS — M23301 Other meniscus derangements, unspecified lateral meniscus, left knee: Secondary | ICD-10-CM | POA: Diagnosis not present

## 2015-03-12 DIAGNOSIS — M25561 Pain in right knee: Secondary | ICD-10-CM | POA: Diagnosis not present

## 2015-03-15 DIAGNOSIS — R2689 Other abnormalities of gait and mobility: Secondary | ICD-10-CM | POA: Diagnosis not present

## 2015-03-16 DIAGNOSIS — R2689 Other abnormalities of gait and mobility: Secondary | ICD-10-CM | POA: Diagnosis not present

## 2015-03-16 HISTORY — PX: OTHER SURGICAL HISTORY: SHX169

## 2015-03-17 DIAGNOSIS — R2689 Other abnormalities of gait and mobility: Secondary | ICD-10-CM | POA: Diagnosis not present

## 2015-03-19 ENCOUNTER — Encounter: Payer: Self-pay | Admitting: Gastroenterology

## 2015-03-19 ENCOUNTER — Ambulatory Visit (INDEPENDENT_AMBULATORY_CARE_PROVIDER_SITE_OTHER): Payer: Medicare Other | Admitting: Gastroenterology

## 2015-03-19 VITALS — BP 120/80 | HR 80 | Ht 65.5 in | Wt 177.4 lb

## 2015-03-19 DIAGNOSIS — I951 Orthostatic hypotension: Secondary | ICD-10-CM | POA: Diagnosis not present

## 2015-03-19 DIAGNOSIS — Z1211 Encounter for screening for malignant neoplasm of colon: Secondary | ICD-10-CM

## 2015-03-19 DIAGNOSIS — Z8719 Personal history of other diseases of the digestive system: Secondary | ICD-10-CM | POA: Diagnosis not present

## 2015-03-19 NOTE — Patient Instructions (Addendum)
We have given you information on Cologuard test  The kit will be mailed to you  You will need to schedule a colonoscopy in 6 months We will refer you to Neurology for headache and vertigo , Your appointment is scheduled on 04/05/2015 Dr Aquino at 12:30pm Follow up after colonoscopy in 6 months  

## 2015-03-19 NOTE — Progress Notes (Signed)
Ashley [name]    [id number]    January 19, 1946  Primary Care Physician:Javier Danise Mina, MD  Referring Physician: [name], MD [address] [place], [place] [address]  Chief complaint:  Due for recall colonoscopy  HPI: 10 yr F with h/o diverticulosis s/p perforated diverticulitis in May 2015 and colostomy with reversal in Sep 2015 here for follow up visit. Patient is due for colonoscopy. She has been having pre syncopal events, feels lightheaded with any change in position. She has also been having increasing frequency of headaches. She had CT head was negative.    Outpatient Encounter Prescriptions as of 03/19/2015  Medication Sig  . acetaminophen (TYLENOL) 500 MG tablet Take 500 mg by mouth daily.   Marland Kitchen alendronate (FOSAMAX) 70 MG tablet Take 70 mg by mouth once a week. Patient takes every Thursday  . ALPRAZolam (XANAX) 0.25 MG tablet TAKE 1 TABLET TWICE A DAY AS NEEDED FOR ANXIETY  . Ascorbic Acid (VITAMIN C) 1000 MG tablet Take 1,000 mg by mouth daily.  Marland Kitchen aspirin EC 81 MG tablet Take 81 mg by mouth daily.  Marland Kitchen atorvastatin (LIPITOR) 40 MG tablet TAKE 1 TABLET (40 MG TOTAL) BY MOUTH EVERY OTHER DAY.  . Calcium Carbonate-Vitamin D (CALCIUM + D PO) Take 1 tablet by mouth daily.  . carvedilol (COREG) 12.5 MG tablet Take 1 tablet (12.5 mg total) by mouth 2 (two) times daily with a meal. (Patient taking differently: Take 6.25 mg by mouth 2 (two) times daily with a meal. )  . ferrous sulfate 325 (65 FE) MG tablet Take 325 mg by mouth every other day.  . mometasone (ELOCON) 0.1 % lotion Apply 1 application topically as needed (for eczema).  Marland Kitchen omeprazole (PRILOSEC) 40 MG capsule TAKE ONE CAPSULE BY MOUTH TWICE A DAY BEFORE MEALS  . polyethylene glycol (MIRALAX / GLYCOLAX) packet Take 17 g by mouth daily as needed for mild constipation.   . Probiotic Product (PROBIOTIC DAILY) CAPS Take 1 capsule by mouth daily.  . Pyridoxine HCl (VITAMIN B6 PO) Take 1 capsule by mouth  daily.  Marland Kitchen spironolactone (ALDACTONE) 25 MG tablet Take 0.5 tablets (12.5 mg total) by mouth daily.  Marland Kitchen triamcinolone (NASACORT) 55 MCG/ACT AERO nasal inhaler Place 2 sprays into the nose 2 (two) times daily.  . vitamin E 100 UNIT capsule Take 100 Units by mouth daily.  . Wheat Dextrin (BENEFIBER DRINK MIX PO) Take 1 packet by mouth daily. Pt takes [facility] Natural Fiber  . zolpidem (AMBIEN) 5 MG tablet TAKE 1/2 TO 1 TABLET BY MOUTH AT BEDTIME AS NEEDED   No facility-administered encounter medications on file as of 03/19/2015.    Allergies as of 03/19/2015 - Review Complete 03/19/2015  Allergen Reaction Noted  . Antihistamines, chlorpheniramine-type Other (See Comments) 08/18/2010  . Codeine Other (See Comments) 10/23/2013  . Cyclobenzaprine Other (See Comments) 02/12/2013  . Cymbalta [duloxetine hcl] Other (See Comments) 10/22/2012  . Influenza vac split [flu virus vaccine] Other (See Comments) 08/18/2010  . Penicillins Other (See Comments) 01/06/2010  . Sulfasalazine Other (See Comments)   . Atorvastatin Other (See Comments) 06/06/2010  . Chlorhexidine Other (See Comments) 09/30/2013  . Dilaudid [hydromorphone hcl] Other (See Comments) 02/05/2014  . Latex Nausea Only, Rash, and Other (See Comments) 11/22/2006  . Morphine and related Other (See Comments) 02/05/2014  . Pravastatin Other (See Comments) 04/15/2014  . Prevacid [lansoprazole] Other (See Comments) 08/20/2013  . Red dye Nausea Only and Swelling 04/01/2012  .  Rosuvastatin Other (See Comments) 06/06/2010  . Simvastatin Other (See Comments) 04/14/2014  . Other  05/28/2014  . Tape Other (See Comments) 10/23/2013  . Metaxalone Other (See Comments) 01/31/2013    Past Medical History  Diagnosis Date  . Migraine without aura, without mention of intractable migraine without mention of status migrainosus   . History of diverticulitis of colon   . Pure hypercholesterolemia   . Irritable bowel syndrome   . Laryngeal nodule     Dr.  Thomasena Edis  . Fibrocystic breast disease   . Hemorrhoids   . Anxiety   . GERD (gastroesophageal reflux disease)   . Chronic systolic CHF (congestive heart failure) (HCC)     a. EF 25-35%, LV dilation, significant MR, goal weight 150lbs;  b. 04/2014 Echo: EF 50-55%, Gr 1 DD.  Marland Kitchen Nonischemic cardiomyopathy ([place])     a. Recovered LV fxn - prev as low as 25%, now 50-55% by echo 04/2014;  b. 2012 s/p MDT [id number] [place] VR single lead ICD.  Marland Kitchen History of pneumonia 2012  . Mitral regurgitation   . Anxiety   . Bronchiectasis     a. possibly mild, treat URIs aggressively  . Essential hypertension   . Chest pain     a. prev neg MV.  . Lung disease 07/14/11  . Anemia   . Fibromyalgia   . Depression   . Disorder of vocal cords   . Chronic sinusitis   . Insomnia   . Osteoporosis 01/2013  . sigmoid diverticulitis with perforation, abscess and fistula 09/30/2013  . H/O multiple allergies 10/10/2013  . PONV (postoperative nausea and vomiting)   . Pneumonia 2011  . Automatic implantable cardioverter-defibrillator in situ 2012    a. MDT single lead ICD.  Marland Kitchen Osteoarthritis     knees, back, hands, low back,   . Cancer (HCC)     basal cell, arms & neck  . Sleep apnea 2010    borderline, inconclusive- /w [place]   . Hemorrhoids   . Diarrhea 07/14/2008    Qualifier: Diagnosis of  By: Glori Bickers MD, Carmell Austria   . Allergy   . Pre-syncope     Past Surgical History  Procedure Laterality Date  . Appendectomy    . Mandible surgery      "got about 6 pins in the bottom of my jaw"  . Knee arthroscopy w/ orif      Left; "meniscus tear"  . Osteotomy      Left foot  . 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 Dexa  03/28/99    osteopenia L/S  . Colonoscopy  1998    nml (sigmoid-Gilbert-nol)  . Head mri, head ct  10/1998    ? H/A focus ([place])  . Emg/mcv  10/16/01    + mild carpal tunnel  . Dexa  03/12/03    2.2 Spine 0.1 Hip  Osteopenia  . Mri right hip  12/12/01    Tendonitis Gluteus Medius to Gtr  Trochanter Neg Bursitis (Dr. Eulas Post)  . L/s films  11/21/01    nml; Right hip nml  . Mri u/s  11/21/01    min. DDD L/3-4  . Colonoscopy  01/04/04    polyps, diverticulosis/severe  . Dexa  11/14/05    1.9 Spine 0.3 Hip  slight improvement  . Flex laryngoscopy  06/11/06    ([place]) nml  . Dexa  11/22/06    improved osteo and fem neck  . [place] bunionectomy  11/04/08    Right Great Toe (Dr. Beola Cord)  . Breast mass excision  01/2010    Left-fibrocystic change w/intraductal papilloma, no malignancy  . Ct chest  12/2010    possible bronchiectasis lower lobes, 02/2011 - enlarged bilateral effusions, bibasilar atx  . Stress myoview  02/2011    no significant ischemia  . Ct chest  04/2011    no PE.  abnormal R hilar and mediastinal adenopathy --> on rpt 05/2011, resolved  . US echocardiography  04/2011    mildly dilated LV, EF 25-30%, hypokinesis, restrictive physiology, mod MR, no AS  . Icd placement  07/14/11    w/pacemaker  . Tonsillectomy and adenoidectomy    . Breast biopsy      "I've had 2-3 bx; all benign"  . Augmentation mammaplasty    . 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 Abdominal hysterectomy  1987    w/BSO  . Lasik      left eye  . Knee surgery      left  . Mandible surgery    . Sinus surgery      x3 with balloon   . Dexa  01/2013    T score -2.6 at spine  . Partial colectomy N/A 10/02/2013    PARTIAL COLECTOMY;  Surgeon: Imogene Burn. Georgette Dover, MD  . Colostomy N/A 10/02/2013    DESCENDING COLOSTOMY;  Surgeon: Imogene Burn. Georgette Dover, MD  . Colostomy reversal N/A 02/10/2014    [name], MD  . Implantable cardioverter defibrillator implant N/A 07/14/2011    IMPLANTABLE CARDIOVERTER DEFIBRILLATOR IMPLANT;  Surgeon: Evans Lance, MD;  Location: [facility] CATH LAB;  Service: Cardiovascular;  Laterality: N/A;  . Esophagogastroduodenoscopy  09/2014    small HH, irregular Z line, fundic gland polyps with mild gastritis ([place])  . Ncs  11/2014    L  ulnar neuropathy, mild B median nerve entrapment ([place])    Family History  Problem Relation Age of Onset  . Lung cancer Father     + smoker  . Dementia Mother   . Osteoporosis Mother     Lumbar spine  . Stroke Mother     x 5 @ 26 YOA  . Arthritis Mother     hands  . Emphysema Mother   . Alcohol abuse Paternal Grandfather   . Hypertension Maternal Grandfather     ?  . Stroke Paternal Grandfather     ?  Marland Kitchen Heart disease Paternal Grandmother   . Heart disease Paternal Grandfather   . Colon polyps Father   . Colon cancer Neg Hx   . Esophageal cancer Neg Hx   . Gallbladder disease Neg Hx     Social History   Social History  . Marital Status: Married    Spouse Name: N/A  . Number of Children: 1  . Years of Education: N/A   Occupational History  . 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 [employer]    Social History Main Topics  . Smoking status: Never Smoker   . Smokeless tobacco: Never Used     Comment: Lived with smokers x 23 yrs, smoked herself "for a week"  . Alcohol Use: No  . Drug Use: No  . Sexual Activity: Not Currently   Other Topics Concern  . Not on file   Social History Narrative   1 adopted child      Lives with husband  Review of systems: Review of Systems  Constitutional: Negative for fever and chills.  HENT: Negative.   Eyes: Negative for blurred vision.  Respiratory: Negative for cough, shortness of breath and wheezing.   Cardiovascular: Negative for chest pain and palpitations.  Gastrointestinal: as per HPI Genitourinary: Negative for dysuria, urgency, frequency and hematuria.  Musculoskeletal: Negative for myalgias, back pain and joint pain.  Skin: Negative for itching and rash.  Neurological: Negative for dizziness, tremors, focal weakness, seizures and loss of consciousness.  Endo/Heme/Allergies: Negative for environmental allergies.  Psychiatric/Behavioral: Negative for depression, suicidal ideas  and hallucinations.  All other systems reviewed and are negative.   Physical Exam: Filed Vitals:   03/19/15 1030  BP: 120/80  Pulse: 80   Gen:      No acute distress HEENT:  EOMI, sclera anicteric Neck:     No masses; no thyromegaly Lungs:    Clear to auscultation bilaterally; normal respiratory effort CV:         Regular rate and rhythm; no murmurs Abd:      + bowel sounds; soft, non-tender; no palpable masses, no distension Ext:    No edema; adequate peripheral perfusion Skin:      Warm and dry; no rash Neuro: alert and oriented x 3 Psych: normal mood and affect  Data Reviewed:  CT head 12/2014 The bony calvarium is intact. The ventricles are of normal size and configuration. No findings to suggest acute hemorrhage, acute infarction or space-occupying mass lesion are noted.  IMPRESSION: No acute intracranial abnormality noted   Assessment and Plan/Recommendations: 41 yr F with h/o sigmoid diverticulosis s/p perforated diverticulitis, L colostomy and reversal of colostomy in Sep 2015 here for follow up visit Patient is reluctant to undergo colonoscopy at this time Will do FIT test instead with cologaurd She is orthostatic and given worsening headaches will send referral to Neurology for further evaluation  K. Denzil Magnuson , MD [phone] Mon-Fri 8a-5p [phone] after 5p, weekends, holidays

## 2015-03-20 ENCOUNTER — Other Ambulatory Visit: Payer: Self-pay | Admitting: Family Medicine

## 2015-03-24 ENCOUNTER — Telehealth: Payer: Self-pay | Admitting: Gastroenterology

## 2015-03-24 DIAGNOSIS — R2689 Other abnormalities of gait and mobility: Secondary | ICD-10-CM | POA: Diagnosis not present

## 2015-03-24 NOTE — Telephone Encounter (Signed)
Patient stopped the fiber as recommended. She started Miralax on Sunday 1/4 cap full. States she had diarrhea. No bowel movement on Monday. Tuesday and Wednesday she took a 1/4 capful of Miralax. She has had to "remove the poop with my finger". She has sticky bowel movements she cannot get out otherwise. Some abdominal discomfort and she can feel gas moving through her abdomen. She mentions she has a "yeast infection" of her skin she has been treating with OTC for 2 weeks. Encouraged to communicate with PCP about issues with her skin.

## 2015-03-24 NOTE — Telephone Encounter (Signed)
Please advise her that's its ok to titrate up or down Miralax as needed based on symptoms and she can try to do half capful daily

## 2015-03-25 NOTE — Telephone Encounter (Signed)
Patient is instructed. 

## 2015-03-26 ENCOUNTER — Ambulatory Visit: Payer: Medicare Other

## 2015-03-26 ENCOUNTER — Other Ambulatory Visit: Payer: Self-pay

## 2015-03-26 ENCOUNTER — Ambulatory Visit (INDEPENDENT_AMBULATORY_CARE_PROVIDER_SITE_OTHER): Payer: Medicare Other

## 2015-03-26 DIAGNOSIS — R55 Syncope and collapse: Secondary | ICD-10-CM | POA: Diagnosis not present

## 2015-03-26 DIAGNOSIS — I428 Other cardiomyopathies: Secondary | ICD-10-CM

## 2015-03-27 ENCOUNTER — Encounter: Payer: Self-pay | Admitting: Family Medicine

## 2015-03-29 DIAGNOSIS — M65332 Trigger finger, left middle finger: Secondary | ICD-10-CM | POA: Diagnosis not present

## 2015-03-29 DIAGNOSIS — M1812 Unilateral primary osteoarthritis of first carpometacarpal joint, left hand: Secondary | ICD-10-CM | POA: Diagnosis not present

## 2015-03-31 DIAGNOSIS — Z1211 Encounter for screening for malignant neoplasm of colon: Secondary | ICD-10-CM | POA: Diagnosis not present

## 2015-03-31 DIAGNOSIS — Z1212 Encounter for screening for malignant neoplasm of rectum: Secondary | ICD-10-CM | POA: Diagnosis not present

## 2015-03-31 DIAGNOSIS — R2689 Other abnormalities of gait and mobility: Secondary | ICD-10-CM | POA: Diagnosis not present

## 2015-03-31 LAB — COLOGUARD

## 2015-04-02 DIAGNOSIS — R2689 Other abnormalities of gait and mobility: Secondary | ICD-10-CM | POA: Diagnosis not present

## 2015-04-05 ENCOUNTER — Encounter: Payer: Self-pay | Admitting: Cardiovascular Disease

## 2015-04-05 ENCOUNTER — Ambulatory Visit (INDEPENDENT_AMBULATORY_CARE_PROVIDER_SITE_OTHER): Payer: Medicare Other | Admitting: Cardiovascular Disease

## 2015-04-05 ENCOUNTER — Other Ambulatory Visit (INDEPENDENT_AMBULATORY_CARE_PROVIDER_SITE_OTHER): Payer: Medicare Other

## 2015-04-05 ENCOUNTER — Ambulatory Visit (INDEPENDENT_AMBULATORY_CARE_PROVIDER_SITE_OTHER): Payer: Medicare Other | Admitting: Neurology

## 2015-04-05 ENCOUNTER — Encounter: Payer: Self-pay | Admitting: Neurology

## 2015-04-05 VITALS — BP 108/74 | HR 80 | Resp 14 | Wt 177.0 lb

## 2015-04-05 VITALS — BP 106/64 | HR 70 | Ht 65.5 in | Wt 177.5 lb

## 2015-04-05 DIAGNOSIS — R51 Headache: Secondary | ICD-10-CM

## 2015-04-05 DIAGNOSIS — Z95810 Presence of automatic (implantable) cardiac defibrillator: Secondary | ICD-10-CM

## 2015-04-05 DIAGNOSIS — I428 Other cardiomyopathies: Secondary | ICD-10-CM

## 2015-04-05 DIAGNOSIS — I5022 Chronic systolic (congestive) heart failure: Secondary | ICD-10-CM | POA: Diagnosis not present

## 2015-04-05 DIAGNOSIS — I420 Dilated cardiomyopathy: Secondary | ICD-10-CM

## 2015-04-05 DIAGNOSIS — I429 Cardiomyopathy, unspecified: Secondary | ICD-10-CM

## 2015-04-05 DIAGNOSIS — R202 Paresthesia of skin: Secondary | ICD-10-CM | POA: Diagnosis not present

## 2015-04-05 DIAGNOSIS — G589 Mononeuropathy, unspecified: Secondary | ICD-10-CM | POA: Diagnosis not present

## 2015-04-05 DIAGNOSIS — I951 Orthostatic hypotension: Secondary | ICD-10-CM

## 2015-04-05 DIAGNOSIS — R519 Headache, unspecified: Secondary | ICD-10-CM

## 2015-04-05 DIAGNOSIS — E7800 Pure hypercholesterolemia, unspecified: Secondary | ICD-10-CM

## 2015-04-05 DIAGNOSIS — G629 Polyneuropathy, unspecified: Secondary | ICD-10-CM

## 2015-04-05 LAB — SEDIMENTATION RATE: Sed Rate: 54 mm/hr — ABNORMAL HIGH (ref 0–22)

## 2015-04-05 LAB — C-REACTIVE PROTEIN: CRP: 0.1 mg/dL — ABNORMAL LOW (ref 0.5–20.0)

## 2015-04-05 LAB — TSH: TSH: 0.85 u[IU]/mL (ref 0.35–4.50)

## 2015-04-05 LAB — VITAMIN B12: Vitamin B-12: 429 pg/mL (ref 211–911)

## 2015-04-05 MED ORDER — CARVEDILOL 6.25 MG PO TABS: 6.2500 mg | ORAL_TABLET | Freq: Two times a day (BID) | ORAL | Status: DC

## 2015-04-05 MED ORDER — DULOXETINE HCL 20 MG PO CPEP: ORAL_CAPSULE | ORAL | Status: DC

## 2015-04-05 NOTE — Assessment & Plan Note (Signed)
She has been relatively euvolemic, No further workup needed

## 2015-04-05 NOTE — Assessment & Plan Note (Signed)
She reports having continued lightheaded symptoms if she stands up too quickly. Blood pressure is low today even on my recheck Recommended she decrease the carvedilol down to 6.25 mg twice a day Currently not taking torsemide

## 2015-04-05 NOTE — Progress Notes (Signed)
NEUROLOGY CONSULTATION NOTE  Ashley [name] MRN: [id number] DOB: 1945-12-04  Referring provider: Dr. Harl Bowie Primary care provider: Dr. [name]  Reason for consult:  Dizziness, headaches  Dear Dr Silverio Decamp:  Thank you for your kind referral of Ashley [name] for consultation of the above symptoms. Although her history is well known to you, please allow me to reiterate it for the purpose of our medical record. Records and images were personally reviewed where available.  HISTORY OF PRESENT ILLNESS: This is a 69 year old right-handed woman with multiple medical problems including hyperlipidemia, non-ischemic cardiomyopathy s/p pacemaker placement, diverticulosis, diverticulitis with perforation, migraines, presenting for evaluation of headaches and dizziness. She reports that headaches started to worsen 4-5 years ago after she had laser surgery in her left eye twice. She kept going back to her eye doctor because of feeling sand in her left eye and blurred vision, saw several eye specialists and had been told there was no clear reason for her symptoms except extreme dry eye. They eye drops however would cause more headaches and worse blurred vision. Headaches are on the left side of her head, over the left temporal and frontal region and behind the left eye. She describes throbbing pain, she reports an average of 15-20 headaches a week, although some days she has no headaches, other days she has 6-8 headaches in a day lasting a couple of minutes to a couple of hours. Her vision would occasionally get blurred. She has been taking Tylenol 2-3 times daily for the past 8-12 months, this helps with her other aches and pains due to arthritis as well. She had been told in the past that she has a "cousin of migraines." She occasionally has loss of vision in her left eye, she would be eating then bends her head forward and cannot see her plate for a few seconds. She has noticed left  eye tearing but no conjunctival injection or ptosis. She had a bad year last year and reports that "nothing has been right since." She had been dealing with abdominal abscesses and wound infections, and feels that since then, arthritis in her hands, joint problems, carpal tunnel syndrome, as well as headaches and balance have worsened. She has fallen 4 times in the past year. She has been having gait unsteadiness for several years, she would be standing then have to "do a little dance" to catch her balance. 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 Sometimes her left leg gets numb that she cannot stand on it. When she drives, her toes go numb. She sometimes feels like water is flowing down her legs. She sometimes has sharp pain in the right big toe, like she is being jabbed by something sharp every 2-3 minutes.   She reports some pain in her jaw, as well as a sore spot on the left side of her throat. She has seen an allergist and ENT specialist and told there is nothing wrong. She has been diagnosed with fibromyalgia in the past, and states that she cannot touch herself without feeling soreness. She also has a burning pain on the left abdominal wall. She reports fainting spells when she first got married, none since 1975, but has been having blackouts where she can't see things, different from the monocular vision loss on the left. She had a carotid ultrasound recently, results unavailable for review. She has intermittent high-pitched tinnitus. She has chronic  neck and back pain. She has alternating diarrhea and constipation, Miralax daily is not helping. She had been having word-finding difficulties that resolved after she stopped Xanax use.   I personally reviewed head CT without contrast done 12/2014 for headaches, no acute changes.   Laboratory Data: Lab Results  Component Value Date   WBC 10.8* 01/11/2015   HGB 12.2 01/11/2015   HCT 36.8  01/11/2015   MCV 89.9 01/11/2015   PLT 236.0 01/11/2015     Chemistry      Component Value Date/Time   NA 138 01/11/2015 1541   NA 143 03/10/2013 1300   K 3.9 01/11/2015 1541   K 3.6 09/09/2012 0735   CL 104 01/11/2015 1541   CO2 26 01/11/2015 1541   BUN 19 01/11/2015 1541   BUN 17 03/10/2013 1300   CREATININE 1.02 01/11/2015 1541   CREATININE 2.16* 04/01/2012 1740      Component Value Date/Time   CALCIUM 9.4 01/11/2015 1541   ALKPHOS 103 12/31/2014 1554   AST 15 12/31/2014 1554   ALT 19 12/31/2014 1554   BILITOT 0.2 12/31/2014 1554      PAST MEDICAL HISTORY: Past Medical History  Diagnosis Date  . Migraine without aura, without mention of intractable migraine without mention of status migrainosus   . History of diverticulitis of colon   . Pure hypercholesterolemia   . Irritable bowel syndrome   . Laryngeal nodule     Dr. Thomasena Edis  . Fibrocystic breast disease   . Hemorrhoids   . Anxiety   . GERD (gastroesophageal reflux disease)   . Chronic systolic CHF (congestive heart failure) (HCC)     a. EF 25-35%, LV dilation, significant MR, goal weight 150lbs;  b. 04/2014 Echo: EF 50-55%, Gr 1 DD.  Marland Kitchen Nonischemic cardiomyopathy ([place])     a. Recovered LV fxn - prev as low as 25%, now 50-55% by echo 04/2014;  b. 2012 s/p MDT [id number] [place] VR single lead ICD.  Marland Kitchen History of pneumonia 2012  . Mitral regurgitation   . Anxiety   . Bronchiectasis     a. possibly mild, treat URIs aggressively  . Essential hypertension   . Chest pain     a. prev neg MV.  . Lung disease 07/14/11  . Anemia   . Fibromyalgia   . Depression   . Disorder of vocal cords   . Chronic sinusitis   . Insomnia   . Osteoporosis 01/2013  . sigmoid diverticulitis with perforation, abscess and fistula 09/30/2013  . H/O multiple allergies 10/10/2013  . PONV (postoperative nausea and vomiting)   . Pneumonia 2011  . Automatic implantable cardioverter-defibrillator in situ 2012    a. MDT single lead ICD.  Marland Kitchen  Osteoarthritis     knees, back, hands, low back,   . Cancer (HCC)     basal cell, arms & neck  . Sleep apnea 2010    borderline, inconclusive- /w [place]   . Hemorrhoids   . Diarrhea 07/14/2008    Qualifier: Diagnosis of  By: Glori Bickers MD, Carmell Austria   . Allergy   . Pre-syncope     PAST SURGICAL HISTORY: Past Surgical History  Procedure Laterality Date  . Appendectomy    . Mandible surgery      "got about 6 pins in the bottom of my jaw"  . Knee arthroscopy w/ orif      Left; "meniscus tear"  . Osteotomy      Left foot  . 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 Dexa  03/28/99    osteopenia L/S  . Colonoscopy  1998    nml (sigmoid-Gilbert-nol)  . Head mri, head ct  10/1998    ? H/A focus ([place])  . Emg/mcv  10/16/01    + mild carpal tunnel  . Dexa  03/12/03    2.2 Spine 0.1 Hip  Osteopenia  . Mri right hip  12/12/01    Tendonitis Gluteus Medius to Gtr Trochanter Neg Bursitis (Dr. Eulas Post)  . L/s films  11/21/01    nml; Right hip nml  . Mri u/s  11/21/01    min. DDD L/3-4  . Colonoscopy  01/04/04    polyps, diverticulosis/severe  . Dexa  11/14/05    1.9 Spine 0.3 Hip  slight improvement  . Flex laryngoscopy  06/11/06    ([place]) nml  . Dexa  11/22/06    improved osteo and fem neck  . [place] bunionectomy  11/04/08    Right Great Toe (Dr. Beola Cord)  . Breast mass excision  01/2010    Left-fibrocystic change w/intraductal papilloma, no malignancy  . Ct chest  12/2010    possible bronchiectasis lower lobes, 02/2011 - enlarged bilateral effusions, bibasilar atx  . Stress myoview  02/2011    no significant ischemia  . Ct chest  04/2011    no PE.  abnormal R hilar and mediastinal adenopathy --> on rpt 05/2011, resolved  . US echocardiography  04/2011    mildly dilated LV, EF 25-30%, hypokinesis, restrictive physiology, mod MR, no AS  . Icd placement  07/14/11    w/pacemaker  . Tonsillectomy and adenoidectomy    . Breast biopsy      "I've had 2-3 bx; all benign"  . Augmentation  mammaplasty    . 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 Abdominal hysterectomy  1987    w/BSO  . Lasik      left eye  . Knee surgery      left  . Mandible surgery    . Sinus surgery      x3 with balloon   . Dexa  01/2013    T score -2.6 at spine  . Partial colectomy N/A 10/02/2013    PARTIAL COLECTOMY;  Surgeon: Imogene Burn. Georgette Dover, MD  . Colostomy N/A 10/02/2013    DESCENDING COLOSTOMY;  Surgeon: Imogene Burn. Georgette Dover, MD  . Colostomy reversal N/A 02/10/2014    [name], MD  . Implantable cardioverter defibrillator implant N/A 07/14/2011    IMPLANTABLE CARDIOVERTER DEFIBRILLATOR IMPLANT;  Surgeon: Evans Lance, MD;  Location: [facility] CATH LAB;  Service: Cardiovascular;  Laterality: N/A;  . Esophagogastroduodenoscopy  09/2014    small HH, irregular Z line, fundic gland polyps with mild gastritis ([place])  . Ncs  11/2014    L ulnar neuropathy, mild B median nerve entrapment ([place])  . Carotid US  [id number]    [id number] LICA, normal RICA    MEDICATIONS: Current Outpatient Prescriptions on File Prior to Visit  Medication Sig Dispense Refill  . acetaminophen (TYLENOL) 500 MG tablet Take 500 mg by mouth daily.     Marland Kitchen alendronate (FOSAMAX) 70 MG tablet Take 70 mg by mouth once a week. Patient takes every Thursday    . ALPRAZolam (XANAX) 0.25 MG tablet TAKE 1 TABLET TWICE A DAY AS NEEDED FOR ANXIETY 60 tablet 0  . Ascorbic Acid (VITAMIN C) 1000 MG tablet Take 1,000 mg by mouth  daily.    . aspirin EC 81 MG tablet Take 81 mg by mouth daily.    Marland Kitchen atorvastatin (LIPITOR) 40 MG tablet TAKE 1 TABLET (40 MG TOTAL) BY MOUTH EVERY OTHER DAY. 45 tablet 3  . Calcium Carbonate-Vitamin D (CALCIUM + D PO) Take 1 tablet by mouth daily.    . carvedilol (COREG) 12.5 MG tablet Take 1 tablet (12.5 mg total) by mouth 2 (two) times daily with a meal. 180 tablet 3  . ferrous sulfate 325 (65 FE) MG tablet Take 325 mg by mouth every other day.    . mometasone (ELOCON) 0.1 % lotion  Apply 1 application topically as needed (for eczema).    Marland Kitchen omeprazole (PRILOSEC) 40 MG capsule TAKE ONE CAPSULE BY MOUTH TWICE A DAY BEFORE MEALS 60 capsule 11  . polyethylene glycol (MIRALAX / GLYCOLAX) packet Take 17 g by mouth daily as needed for mild constipation.     . Probiotic Product (PROBIOTIC DAILY) CAPS Take 1 capsule by mouth daily.    . Pyridoxine HCl (VITAMIN B6 PO) Take 1 capsule by mouth daily.    Marland Kitchen spironolactone (ALDACTONE) 25 MG tablet Take 0.5 tablets (12.5 mg total) by mouth daily. 90 tablet 3  . triamcinolone (NASACORT) 55 MCG/ACT AERO nasal inhaler Place 2 sprays into the nose 2 (two) times daily.    . vitamin E 100 UNIT capsule Take 100 Units by mouth daily.    . Wheat Dextrin (BENEFIBER DRINK MIX PO) Take 1 packet by mouth daily. Pt takes [facility] Natural Fiber    . zolpidem (AMBIEN) 5 MG tablet TAKE 1/2 TO 1 TABLET BY MOUTH AT BEDTIME AS NEEDED 30 tablet 0  . [DISCONTINUED] bisoprolol (ZEBETA) 5 MG tablet Take 5 mg by mouth daily.     No current facility-administered medications on file prior to visit.    ALLERGIES: Allergies  Allergen Reactions  . Antihistamines, Chlorpheniramine-Type Other (See Comments)    Makes her eyes look like she sees strobe lights  . Codeine Other (See Comments)    Confusion and dizziness  . Cyclobenzaprine Other (See Comments)    Adverse reaction - back and throat pain, dizziness, exhaustion  . Cymbalta [Duloxetine Hcl] Other (See Comments)    "body spasms and made me feel weird in my head"   . Influenza Vac Split [Flu Virus Vaccine] Other (See Comments)    "allergic to concentrated eggs that are in vaccine"  . Penicillins Other (See Comments)    REACTION: "told 50 years ago I couldn't take it; might be immune to it", able to take amoxicillin  . Sulfasalazine Other (See Comments)    "makes my whole body smell like sulfa & makes me sick just smelling it"  . Atorvastatin Other (See Comments)    Severe muscle cramps to generic  atorvastatin.  Able to take brand lipitor.  . Chlorhexidine Other (See Comments)    blisters  . Dilaudid [Hydromorphone Hcl] Other (See Comments)    Feels like she is burning internally   . Latex Nausea Only, Rash and Other (See Comments)    Pain and infection  . Morphine And Related Other (See Comments)    Internally feels like she is on fire   . Pravastatin Other (See Comments)    myalgias  . Prevacid [Lansoprazole] Other (See Comments)    Worsened GI side effects  . Red Dye Nausea Only and Swelling  . Rosuvastatin Other (See Comments)    Was not effective controlling lipids  . Simvastatin Other (  See Comments)    Leg cramps  . Other     NOTE: pt is able to take cephalosporins without reaction  . Tape Other (See Comments)    All adhesives  - Blisters, itching and burning   . Metaxalone Other (See Comments)    REACTION: unknown    FAMILY HISTORY: Family History  Problem Relation Age of Onset  . Lung cancer Father     + smoker  . Dementia Mother   . Osteoporosis Mother     Lumbar spine  . Stroke Mother     x 5 @ 37 YOA  . Arthritis Mother     hands  . Emphysema Mother   . Alcohol abuse Paternal Grandfather   . Hypertension Maternal Grandfather     ?  . Stroke Paternal Grandfather     ?  Marland Kitchen Heart disease Paternal Grandmother   . Heart disease Paternal Grandfather   . Colon polyps Father   . Colon cancer Neg Hx   . Esophageal cancer Neg Hx   . Gallbladder disease Neg Hx     SOCIAL HISTORY: Social History   Social History  . Marital Status: Married    Spouse Name: N/A  . Number of Children: 1  . Years of Education: N/A   Occupational History  . 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 [employer]    Social History Main Topics  . Smoking status: Never Smoker   . Smokeless tobacco: Never Used     Comment: Lived with smokers x 23 yrs, smoked herself "for a week"  . Alcohol Use: No  . Drug Use: No  . Sexual Activity: Not Currently    Other Topics Concern  . Not on file   Social History Narrative   1 adopted child      Lives with husband             REVIEW OF SYSTEMS: Constitutional: No fevers, chills, or sweats, + generalized fatigue, no change in appetite Eyes: No visual changes, double vision, eye pain Ear, nose and throat: No hearing loss, ear pain, nasal congestion, sore throat Cardiovascular: No chest pain, palpitations Respiratory:  No shortness of breath at rest or with exertion, wheezes GastrointestinaI: No nausea, vomiting, diarrhea, abdominal pain, fecal incontinence Genitourinary:  No dysuria, urinary retention or frequency Musculoskeletal:  + neck pain, back pain Integumentary: No rash, pruritus, skin lesions Neurological: as above Psychiatric: No depression, insomnia, anxiety Endocrine: No palpitations, fatigue, diaphoresis, mood swings, change in appetite, change in weight, increased thirst Hematologic/Lymphatic:  No anemia, purpura, petechiae. Allergic/Immunologic: no itchy/runny eyes, nasal congestion, recent allergic reactions, rashes  PHYSICAL EXAM: Filed Vitals:   04/05/15 1234  BP: 108/74  Pulse: 80  Resp: 14   General: No acute distress Head:  Normocephalic/atraumatic Eyes: Fundoscopic exam shows bilateral sharp discs, no vessel changes, exudates, or hemorrhages Neck: supple, no paraspinal tenderness, full range of motion Back: No paraspinal tenderness Heart: regular rate and rhythm Lungs: Clear to auscultation bilaterally. Vascular: No carotid bruits. Skin/Extremities: No rash, no edema Neurological Exam: Mental status: alert and oriented to person, place, and time, no dysarthria or aphasia, Fund of knowledge is appropriate.  Recent and remote memory are intact.  Attention and concentration are normal.    Able to name objects and repeat phrases. Cranial nerves: CN I: not tested CN II: pupils equal, round and reactive to light, visual fields intact, fundi unremarkable. CN  III, IV, VI:  full range of motion, no  nystagmus, no ptosis CN V: facial sensation intact CN VII: upper and lower face symmetric CN VIII: hearing intact to finger rub CN IX, X: gag intact, uvula midline CN XI: sternocleidomastoid and trapezius muscles intact CN XII: tongue midline Bulk & Tone: normal, no fasciculations. Motor: 5/5 throughout with no pronator drift. Sensation: decreased pin and cold on right UE and LE (stocking distribution up to above ankle), decreased vibration to right ankle, intact to all modalities on both UE, intact JPS. Romberg test negative Deep Tendon Reflexes: +1 on both UE, right patella, +2 left patella, absent ankle jerk bilaterally, no ankle clonus Plantar responses: downgoing bilaterally Cerebellar: no incoordination on finger to nose, heel to shin. No dysdiadochokinesia Gait: narrow-based and steady, able to tandem walk adequately. Tremor: none  IMPRESSION: This is a 69 year old right-handed woman with a history of hyperlipidemia, cardiomyopathy s/p pacemaker placement, diverticulosis/diverticulitis, migraines, presenting for worsening headaches and dizziness. 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 Dizziness likely multifactorial from neuropathy and orthostasis, neuropathy labs will be ordered. She will discuss BP issues with cardiology. There are some migrainous features to her headaches, however there is likely a component of medication overuse headaches as well with daily intake of Tylenol, as well as chronic pain syndrome with various aches and pains throughout her body. She may benefit from starting low dose Cymbalta. She may have been on this before, but we discussed starting at a very low dose and uptitrating as tolerated. Side effects were discussed. She will continue with balance therapy. She will follow-up in 2 months.   Thank you for allowing me to participate in the care  of this patient. Please do not hesitate to call for any questions or concerns.   Ellouise Newer, M.D.  CC: Dr. Silverio Decamp, Dr. Danise Mina

## 2015-04-05 NOTE — Progress Notes (Signed)
Patient ID: Ashley [name], female    DOB: 1945-06-20, 69 y.o.   MRN: [id number]  HPI Comments: 69 y/o female with history of fibromyalgia, irritable bowel, pneumonia, chronic cough felt secondary to GERD, abnormal PFTs in the past, shortness of breath dating back to September 2012 that presented acutely, CT scan showing progressive pleural effusion , echo showing Cardiomyopathy  ejection fraction estimated at 25-35%,  Previous  Lexiscan Myoview  showed no significant ischemia, no EKG changes concerning for ischemia.  EF now 50 to 55% in 02/2013 and 12/15 She presents for follow-up today for follow-up of her cardiomyopathy  In general she reports having numerous issues most of which are noncardiac in nature She has arthritis in her hands, weight has been increasing, waxing waning constipation with diarrhea, plantar fasciitis, chronic knee problems requiring cortisone shots, neuropathy in her feet.  Recent echocardiogram confirming ejection fraction 50-55%, no change from 2015 Recent carotid ultrasound showing minimal plaque on the left less than 39%, none on the right  She's not exercising given her above symptoms She is concerned about low blood pressure, does have some dizziness when she stands up too quickly Denies any fluid retention, PND or orthopnea She's not taking torsemide, only sparingly possibly once per month  EKG on today's visit shows normal sinus rhythm with rate 70 bpm, no significant ST or T-wave changes  Other past medical history Previous dehydration with overuse of torsemide Eating more, no regular exercise, sedentary in general secondary to fibromyalgia and fatigue  Other past medical history Previous admission to [facility] in December 2012 for bronchitis and hypoxia as well as systolic CHF. She had antibiotics, prednisone and diuresis and has felt better since discharge. ACE inhibitor placed on hold secondary to chronic cough.  S/P ICD 10 days ago for  EF <35%.  Goal weight had been in the low 150 range. Previous Cardiac MRI showed ejection fraction 34 %    ICD placed  by Dr. Lovena Le.  Previous symptoms of malaise and fatigue, tachycardia, weight gain, leg swelling . previously taking high doses of diuretics for weight gain causing renal dysfunction, dehydration. For creatinine 1.9 we suggested she cut back on her diuretics. Prior weight was in the low 160 pound range  History of diverticulosis, abscess formation, perforation requiring a long course of antibiotics with PICC line,  subsequent CT scan Sep 30 2013 showing free air, abscess requiring partial colectomy. She has an ostomy. Today she has lost 22 pounds by her report.   Previous echocardiogram shows improvement in her ejection fraction up to 50%. Challenging image quality. On my own review, it does appear at least 45, if not 50%.   Allergies  Allergen Reactions  . Antihistamines, Chlorpheniramine-Type Other (See Comments)    Makes her eyes look like she sees strobe lights  . Codeine Other (See Comments)    Confusion and dizziness  . Cyclobenzaprine Other (See Comments)    Adverse reaction - back and throat pain, dizziness, exhaustion  . Cymbalta [Duloxetine Hcl] Other (See Comments)    "body spasms and made me feel weird in my head"   . Influenza Vac Split [Flu Virus Vaccine] Other (See Comments)    "allergic to concentrated eggs that are in vaccine"  . Penicillins Other (See Comments)    REACTION: "told 50 years ago I couldn't take it; might be immune to it", able to take amoxicillin  . Sulfasalazine Other (See Comments)    "makes my whole body smell like sulfa &  makes me sick just smelling it"  . Atorvastatin Other (See Comments)    Severe muscle cramps to generic atorvastatin.  Able to take brand lipitor.  . Chlorhexidine Other (See Comments)    blisters  . Dilaudid [Hydromorphone Hcl] Other (See Comments)    Feels like she is burning internally   . Latex Nausea Only,  Rash and Other (See Comments)    Pain and infection  . Morphine And Related Other (See Comments)    Internally feels like she is on fire   . Pravastatin Other (See Comments)    myalgias  . Prevacid [Lansoprazole] Other (See Comments)    Worsened GI side effects  . Red Dye Nausea Only and Swelling  . Rosuvastatin Other (See Comments)    Was not effective controlling lipids  . Simvastatin Other (See Comments)    Leg cramps  . Other     NOTE: pt is able to take cephalosporins without reaction  . Tape Other (See Comments)    All adhesives  - Blisters, itching and burning   . Metaxalone Other (See Comments)    REACTION: unknown    Outpatient Encounter Prescriptions as of 04/05/2015  Medication Sig  . acetaminophen (TYLENOL) 500 MG tablet Take 500 mg by mouth daily.   Marland Kitchen alendronate (FOSAMAX) 70 MG tablet Take 70 mg by mouth once a week. Patient takes every Thursday  . ALPRAZolam (XANAX) 0.25 MG tablet TAKE 1 TABLET TWICE A DAY AS NEEDED FOR ANXIETY  . Ascorbic Acid (VITAMIN C) 1000 MG tablet Take 1,000 mg by mouth daily.  Marland Kitchen aspirin EC 81 MG tablet Take 81 mg by mouth daily.  Marland Kitchen atorvastatin (LIPITOR) 40 MG tablet TAKE 1 TABLET (40 MG TOTAL) BY MOUTH EVERY OTHER DAY. (Patient taking differently: TAKE 1 TABLET (40 MG TOTAL) BY MOUTH EVERY TWO DAYS.)  . Calcium Carbonate-Vitamin D (CALCIUM + D PO) Take 1 tablet by mouth daily.  . carvedilol (COREG) 6.25 MG tablet Take 1 tablet (6.25 mg total) by mouth 2 (two) times daily with a meal.  . ferrous sulfate 325 (65 FE) MG tablet Take 325 mg by mouth every other day.  . mometasone (ELOCON) 0.1 % lotion Apply 1 application topically as needed (for eczema).  Marland Kitchen omeprazole (PRILOSEC) 40 MG capsule TAKE ONE CAPSULE BY MOUTH TWICE A DAY BEFORE MEALS  . polyethylene glycol (MIRALAX / GLYCOLAX) packet Take 17 g by mouth daily.   . Probiotic Product (PROBIOTIC DAILY) CAPS Take 1 capsule by mouth daily.  . Pyridoxine HCl (VITAMIN B6 PO) Take 1 capsule by  mouth daily.  Marland Kitchen spironolactone (ALDACTONE) 12.5 mg TABS tablet Take 12.5 mg by mouth daily.  Marland Kitchen triamcinolone (NASACORT) 55 MCG/ACT AERO nasal inhaler Place 2 sprays into the nose 2 (two) times daily.  . vitamin E 100 UNIT capsule Take 100 Units by mouth daily.  Marland Kitchen zolpidem (AMBIEN) 5 MG tablet TAKE 1/2 TO 1 TABLET BY MOUTH AT BEDTIME AS NEEDED  . [DISCONTINUED] carvedilol (COREG) 12.5 MG tablet Take 1 tablet (12.5 mg total) by mouth 2 (two) times daily with a meal.  . [DISCONTINUED] DULoxetine (CYMBALTA) 20 MG capsule Take 1 capsule every other night for 2 weeks, then increase to 1 capsule every night (Patient not taking: Reported on 04/05/2015)  . [DISCONTINUED] spironolactone (ALDACTONE) 25 MG tablet Take 0.5 tablets (12.5 mg total) by mouth daily. (Patient not taking: Reported on 04/05/2015)  . [DISCONTINUED] Wheat Dextrin (BENEFIBER DRINK MIX PO) Take 1 packet by mouth daily. Pt takes  [facility] [employer]   No facility-administered encounter medications on file as of 04/05/2015.    Past Medical History  Diagnosis Date  . Migraine without aura, without mention of intractable migraine without mention of status migrainosus   . History of diverticulitis of colon   . Pure hypercholesterolemia   . Irritable bowel syndrome   . Laryngeal nodule     Dr. Thomasena Edis  . Fibrocystic breast disease   . Hemorrhoids   . Anxiety   . GERD (gastroesophageal reflux disease)   . Chronic systolic CHF (congestive heart failure) (HCC)     a. EF 25-35%, LV dilation, significant MR, goal weight 150lbs;  b. 04/2014 Echo: EF 50-55%, Gr 1 DD.  Marland Kitchen Nonischemic cardiomyopathy ([place])     a. Recovered LV fxn - prev as low as 25%, now 50-55% by echo 04/2014;  b. 2012 s/p MDT [id number] [place] VR single lead ICD.  Marland Kitchen History of pneumonia 2012  . Mitral regurgitation   . Anxiety   . Bronchiectasis     a. possibly mild, treat URIs aggressively  . Essential hypertension   . Chest pain     a. prev neg MV.  . Lung disease 07/14/11   . Anemia   . Fibromyalgia   . Depression   . Disorder of vocal cords   . Chronic sinusitis   . Insomnia   . Osteoporosis 01/2013  . sigmoid diverticulitis with perforation, abscess and fistula 09/30/2013  . H/O multiple allergies 10/10/2013  . PONV (postoperative nausea and vomiting)   . Pneumonia 2011  . Automatic implantable cardioverter-defibrillator in situ 2012    a. MDT single lead ICD.  Marland Kitchen Osteoarthritis     knees, back, hands, low back,   . Cancer (HCC)     basal cell, arms & neck  . Sleep apnea 2010    borderline, inconclusive- /w [place]   . Hemorrhoids   . Diarrhea 07/14/2008    Qualifier: Diagnosis of  By: Glori Bickers MD, Carmell Austria   . Allergy   . Pre-syncope     Past Surgical History  Procedure Laterality Date  . Appendectomy    . Mandible surgery      "got about 6 pins in the bottom of my jaw"  . Knee arthroscopy w/ orif      Left; "meniscus tear"  . Osteotomy      Left foot  . 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 Dexa  03/28/99    osteopenia L/S  . Colonoscopy  1998    nml (sigmoid-Gilbert-nol)  . Head mri, head ct  10/1998    ? H/A focus ([place])  . Emg/mcv  10/16/01    + mild carpal tunnel  . Dexa  03/12/03    2.2 Spine 0.1 Hip  Osteopenia  . Mri right hip  12/12/01    Tendonitis Gluteus Medius to Gtr Trochanter Neg Bursitis (Dr. Eulas Post)  . L/s films  11/21/01    nml; Right hip nml  . Mri u/s  11/21/01    min. DDD L/3-4  . Colonoscopy  01/04/04    polyps, diverticulosis/severe  . Dexa  11/14/05    1.9 Spine 0.3 Hip  slight improvement  . Flex laryngoscopy  06/11/06    ([place]) nml  . Dexa  11/22/06    improved osteo and fem neck  . [place] bunionectomy  11/04/08    Right Great Toe (Dr. Beola Cord)  . Breast mass excision  01/2010  Left-fibrocystic change w/intraductal papilloma, no malignancy  . Ct chest  12/2010    possible bronchiectasis lower lobes, 02/2011 - enlarged bilateral effusions, bibasilar atx  . Stress myoview  02/2011    no  significant ischemia  . Ct chest  04/2011    no PE.  abnormal R hilar and mediastinal adenopathy --> on rpt 05/2011, resolved  . US echocardiography  04/2011    mildly dilated LV, EF 25-30%, hypokinesis, restrictive physiology, mod MR, no AS  . Icd placement  07/14/11    w/pacemaker  . Tonsillectomy and adenoidectomy    . Breast biopsy      "I've had 2-3 bx; all benign"  . Augmentation mammaplasty    . 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 Abdominal hysterectomy  1987    w/BSO  . Lasik      left eye  . Knee surgery      left  . Mandible surgery    . Sinus surgery      x3 with balloon   . Dexa  01/2013    T score -2.6 at spine  . Partial colectomy N/A 10/02/2013    PARTIAL COLECTOMY;  Surgeon: Imogene Burn. Georgette Dover, MD  . Colostomy N/A 10/02/2013    DESCENDING COLOSTOMY;  Surgeon: Imogene Burn. Georgette Dover, MD  . Colostomy reversal N/A 02/10/2014    [name], MD  . Implantable cardioverter defibrillator implant N/A 07/14/2011    IMPLANTABLE CARDIOVERTER DEFIBRILLATOR IMPLANT;  Surgeon: Evans Lance, MD;  Location: [facility] CATH LAB;  Service: Cardiovascular;  Laterality: N/A;  . Esophagogastroduodenoscopy  09/2014    small HH, irregular Z line, fundic gland polyps with mild gastritis ([place])  . Ncs  11/2014    L ulnar neuropathy, mild B median nerve entrapment ([place])  . Carotid US  [id number]    [id number] LICA, normal RICA    Social History  reports that she has never smoked. She has never used smokeless tobacco. She reports that she does not drink alcohol or use illicit drugs.  Family History family history includes Alcohol abuse in her paternal grandfather; Arthritis in her mother; Colon polyps in her father; Dementia in her mother; Emphysema in her mother; Heart disease in her paternal grandfather and paternal grandmother; Hypertension in her maternal grandfather; Lung cancer in her father; Osteoporosis in her mother; Stroke in her mother and paternal  grandfather. There is no history of Colon cancer, Esophageal cancer, or Gallbladder disease.       Review of Systems  Constitutional: Positive for fatigue.  Respiratory: Negative.   Cardiovascular: Negative.   Gastrointestinal: Positive for abdominal pain.  Musculoskeletal: Positive for myalgias, back pain, arthralgias and neck stiffness.  Skin: Negative.   Neurological: Negative.   Hematological: Negative.   Psychiatric/Behavioral: Negative.   All other systems reviewed and are negative.   BP 106/64 mmHg  Pulse 70  Ht 5' 5.5" (1.664 m)  Wt 177 lb 8 oz (80.513 kg)  BMI 29.08 kg/m2  Physical Exam  Constitutional: She is oriented to person, place, and time. She appears well-developed and well-nourished.  HENT:  Head: Normocephalic.  Nose: Nose normal.  Mouth/Throat: Oropharynx is clear and moist.  Eyes: Conjunctivae are normal. Pupils are equal, round, and reactive to light.  Neck: Normal range of motion. Neck supple. No JVD present.  Cardiovascular: Normal rate, regular rhythm, S1 normal, S2 normal, normal heart sounds and intact distal pulses.  Exam reveals no  gallop and no friction rub.   No murmur heard. Pulmonary/Chest: Effort normal and breath sounds normal. No respiratory distress. She has no wheezes. She has no rales. She exhibits no tenderness.  Abdominal: Soft. Bowel sounds are normal. She exhibits no distension. There is no tenderness.  Musculoskeletal: Normal range of motion. She exhibits no edema or tenderness.  Lymphadenopathy:    She has no cervical adenopathy.  Neurological: She is alert and oriented to person, place, and time. Coordination normal.  Skin: Skin is warm and dry. No rash noted. No erythema.  Psychiatric: She has a normal mood and affect. Her behavior is normal. Judgment and thought content normal.    Assessment and Plan  Nursing note and vitals reviewed.

## 2015-04-05 NOTE — Assessment & Plan Note (Signed)
Cholesterol is at goal on the current lipid regimen. No changes to the medications were made.  

## 2015-04-05 NOTE — Assessment & Plan Note (Signed)
Recent echocardiogram again confirms ejection fraction 50-55% Medication changes as above secondary to low blood pressure and symptoms

## 2015-04-05 NOTE — Patient Instructions (Addendum)
You are doing well.  Please cut the coreg in 1/2 twice a day Try to avoid the torsemide  Please call us if you have new issues that need to be addressed before your next appt.  Your physician wants you to follow-up in: 6 months.  You will receive a reminder letter in the mail two months in advance. If you don't receive a letter, please call our office to schedule the follow-up appointment.

## 2015-04-05 NOTE — Patient Instructions (Addendum)
1. Bloodwork for TSH, B12, ESR, CRP, SPEP/IFE 2. Start Cymbalta 51m: Take 1 capsule every other night for 2 weeks, then increase to 1 capsule every night 3. Continue with balance therapy 4. Follow-up in 2 months

## 2015-04-05 NOTE — Assessment & Plan Note (Signed)
Followed by EP, Dr. Lovena Le

## 2015-04-06 ENCOUNTER — Ambulatory Visit (INDEPENDENT_AMBULATORY_CARE_PROVIDER_SITE_OTHER): Payer: Medicare Other | Admitting: Family Medicine

## 2015-04-06 ENCOUNTER — Encounter: Payer: Self-pay | Admitting: Family Medicine

## 2015-04-06 VITALS — BP 104/72 | HR 60 | Temp 97.7°F | Wt 176.5 lb

## 2015-04-06 DIAGNOSIS — K5720 Diverticulitis of large intestine with perforation and abscess without bleeding: Secondary | ICD-10-CM

## 2015-04-06 DIAGNOSIS — L304 Erythema intertrigo: Secondary | ICD-10-CM

## 2015-04-06 DIAGNOSIS — G629 Polyneuropathy, unspecified: Secondary | ICD-10-CM | POA: Insufficient documentation

## 2015-04-06 DIAGNOSIS — R519 Headache, unspecified: Secondary | ICD-10-CM | POA: Insufficient documentation

## 2015-04-06 DIAGNOSIS — R51 Headache: Principal | ICD-10-CM

## 2015-04-06 LAB — CBC WITH DIFFERENTIAL/PLATELET
Basophils Absolute: 0.1 10*3/uL (ref 0.0–0.1)
Basophils Relative: 0.6 % (ref 0.0–3.0)
Eosinophils Absolute: 0.1 10*3/uL (ref 0.0–0.7)
Eosinophils Relative: 0.9 % (ref 0.0–5.0)
HCT: 36.3 % (ref 36.0–46.0)
Hemoglobin: 12 g/dL (ref 12.0–15.0)
Lymphocytes Relative: 23.2 % (ref 12.0–46.0)
Lymphs Abs: 2.2 10*3/uL (ref 0.7–4.0)
MCHC: 33.2 g/dL (ref 30.0–36.0)
MCV: 90.9 fl (ref 78.0–100.0)
Monocytes Absolute: 0.8 10*3/uL (ref 0.1–1.0)
Monocytes Relative: 8.1 % (ref 3.0–12.0)
Neutro Abs: 6.2 10*3/uL (ref 1.4–7.7)
Neutrophils Relative %: 67.2 % (ref 43.0–77.0)
Platelets: 210 10*3/uL (ref 150.0–400.0)
RBC: 3.99 Mil/uL (ref 3.87–5.11)
RDW: 13.4 % (ref 11.5–15.5)
WBC: 9.3 10*3/uL (ref 4.0–10.5)

## 2015-04-06 MED ORDER — FLUCONAZOLE 150 MG PO TABS: 150.0000 mg | ORAL_TABLET | Freq: Once | ORAL | Status: DC

## 2015-04-06 MED ORDER — KETOCONAZOLE 2 % EX CREA: 1.0000 "application " | TOPICAL_CREAM | Freq: Every day | CUTANEOUS | Status: DC

## 2015-04-06 NOTE — Patient Instructions (Addendum)
Treat yeast infection with continued ketoconazole cream (refilled today). Use non-talc powder (burt's bees has one) on top as a drying agent. Blood work today. We will treat with diflucan pill x2, take once then repeat in 4-5 days. Hold lipitor when you take diflucan.

## 2015-04-06 NOTE — Progress Notes (Signed)
Pre visit review using our clinic review tool, if applicable. No additional management support is needed unless otherwise documented below in the visit note. 

## 2015-04-06 NOTE — Progress Notes (Signed)
BP 104/72 mmHg  Pulse 60  Temp(Src) 97.7 F (36.5 C) (Oral)  Wt 176 lb 8 oz (80.06 kg)   CC: ?yeast infection "I'm falling apart"  Subjective:    Patient ID: Ashley [name], female    DOB: 1945-10-05, 69 y.o.   MRN: [id number]  HPI: Ashley [name] is a 69 y.o. female presenting on 04/06/2015 for Rash   Has seen several doctors since my last evaluation 02/23/2015 - established with GI who referred her to neurology due to headaches with orthostasis, started on cymbalta $RemoveBef'20mg'eTpLrBscnh$  QOD and undergoing workup for periph neuropathy (TSH, B12, ESR, CRP, SPEP/IFE), saw Dr [name] in follow up. Persistent orthostasis so carvedilol was decreased to 6.$RemoveBefore'25mg'[id number]$  bid. Saw orthopedist for knee pain. Undergoing balance training.   Presents today with groin rash concern. Noticed rash over last several months, stays humid in groin below pannus. Self treating with ketoconazole, clotrimazole/betamethasone, terconazole, and tolnaftate creams without significant improvement. No fevers. Urine feels like "blowing bubbles". LLQ burning discomfort with intermittent chilled feeling.  Relevant past medical, surgical, family and social history reviewed and updated as indicated. Interim medical history since our last visit reviewed. Allergies and medications reviewed and updated. Current Outpatient Prescriptions on File Prior to Visit  Medication Sig  . acetaminophen (TYLENOL) 500 MG tablet Take 500 mg by mouth daily.   Marland Kitchen alendronate (FOSAMAX) 70 MG tablet Take 70 mg by mouth once a week. Patient takes every Thursday  . Ascorbic Acid (VITAMIN C) 1000 MG tablet Take 1,000 mg by mouth daily.  Marland Kitchen aspirin EC 81 MG tablet Take 81 mg by mouth daily.  Marland Kitchen atorvastatin (LIPITOR) 40 MG tablet TAKE 1 TABLET (40 MG TOTAL) BY MOUTH EVERY OTHER DAY. (Patient taking differently: TAKE 1 TABLET (40 MG TOTAL) BY MOUTH EVERY TWO DAYS.)  . Calcium Carbonate-Vitamin D (CALCIUM + D PO) Take 1 tablet by mouth daily.  . carvedilol (COREG) 6.25  MG tablet Take 1 tablet (6.25 mg total) by mouth 2 (two) times daily with a meal.  . ferrous sulfate 325 (65 FE) MG tablet Take 325 mg by mouth every other day.  . mometasone (ELOCON) 0.1 % lotion Apply 1 application topically as needed (for eczema).  Marland Kitchen omeprazole (PRILOSEC) 40 MG capsule TAKE ONE CAPSULE BY MOUTH TWICE A DAY BEFORE MEALS  . polyethylene glycol (MIRALAX / GLYCOLAX) packet Take 17 g by mouth daily.   . Probiotic Product (PROBIOTIC DAILY) CAPS Take 1 capsule by mouth daily.  . Pyridoxine HCl (VITAMIN B6 PO) Take 1 capsule by mouth daily.  Marland Kitchen spironolactone (ALDACTONE) 12.5 mg TABS tablet Take 12.5 mg by mouth daily.  Marland Kitchen triamcinolone (NASACORT) 55 MCG/ACT AERO nasal inhaler Place 2 sprays into the nose 2 (two) times daily.  . vitamin E 100 UNIT capsule Take 100 Units by mouth daily.  Marland Kitchen zolpidem (AMBIEN) 5 MG tablet TAKE 1/2 TO 1 TABLET BY MOUTH AT BEDTIME AS NEEDED  . ALPRAZolam (XANAX) 0.25 MG tablet TAKE 1 TABLET TWICE A DAY AS NEEDED FOR ANXIETY (Patient not taking: Reported on 04/06/2015)  . [DISCONTINUED] bisoprolol (ZEBETA) 5 MG tablet Take 5 mg by mouth daily.   No current facility-administered medications on file prior to visit.    Review of Systems Per HPI unless specifically indicated in ROS section     Objective:    BP 104/72 mmHg  Pulse 60  Temp(Src) 97.7 F (36.5 C) (Oral)  Wt 176 lb 8 oz (80.06 kg)  Wt Readings from Last 3 Encounters:  04/06/15 176 lb 8 oz (80.06 kg)  04/05/15 177 lb 8 oz (80.513 kg)  04/05/15 177 lb (80.287 kg)    Physical Exam  Constitutional: She appears well-developed and well-nourished. No distress.  She looks well today  Abdominal: Soft. Normal appearance and bowel sounds are normal. She exhibits no distension and no mass. There is no hepatosplenomegaly. There is generalized tenderness (diffuse mild discomfort). There is no rigidity, no rebound, no guarding, no CVA tenderness and negative Murphy's sign.  abd incisions  present Mild erythema below pannus of abdomen  Skin: Skin is warm and dry. There is erythema (mild).  Psychiatric: She has a normal mood and affect.  Bright affect today  Nursing note and vitals reviewed.  Results for orders placed or performed in visit on 04/05/15  TSH  Result Value Ref Range   TSH 0.85 0.35 - 4.50 uIU/mL  Vitamin B12  Result Value Ref Range   Vitamin B-12 429 211 - 911 pg/mL  Sed Rate (ESR)  Result Value Ref Range   Sed Rate 54 (H) 0 - 22 mm/hr  C-reactive protein  Result Value Ref Range   CRP 0.1 (L) 0.5 - 20.0 mg/dL      Assessment & Plan:   Problem List Items Addressed This Visit    Sigmoid diverticulitis with abscess, perforation and fistula formation    H/o this. With developing abd pain, check CBC today.      Relevant Orders   CBC with Differential/Platelet   Intertrigo - Primary    Seems to have had best results with ketoconazole - will refill today. Will also treat with 2d course of diflucan for possible vaginal infection. Discussed external cream and drying powder use - she cannot take nystop because of talc component (smell makes her sick). Discussed burt's bees powder or other talc-free drying powder (does well with cornstarch). Update if not improving with treatment.          Follow up plan: No Follow-up on file.

## 2015-04-06 NOTE — Assessment & Plan Note (Signed)
H/o this. With developing abd pain, check CBC today.

## 2015-04-06 NOTE — Assessment & Plan Note (Signed)
Seems to have had best results with ketoconazole - will refill today. Will also treat with 2d course of diflucan for possible vaginal infection. Discussed external cream and drying powder use - she cannot take nystop because of talc component (smell makes her sick). Discussed burt's bees powder or other talc-free drying powder (does well with cornstarch). Update if not improving with treatment.

## 2015-04-07 DIAGNOSIS — R2689 Other abnormalities of gait and mobility: Secondary | ICD-10-CM | POA: Diagnosis not present

## 2015-04-09 DIAGNOSIS — R2689 Other abnormalities of gait and mobility: Secondary | ICD-10-CM | POA: Diagnosis not present

## 2015-04-09 LAB — PROTEIN ELECTROPHORESIS, SERUM
Albumin ELP: 4.1 g/dL (ref 3.8–4.8)
Alpha-1-Globulin: 0.3 g/dL (ref 0.2–0.3)
Alpha-2-Globulin: 0.9 g/dL (ref 0.5–0.9)
Beta 2: 0.8 g/dL — ABNORMAL HIGH (ref 0.2–0.5)
Beta Globulin: 0.6 g/dL (ref 0.4–0.6)
Gamma Globulin: 0.8 g/dL (ref 0.8–1.7)
Total Protein, Serum Electrophoresis: 7.5 g/dL (ref 6.1–8.1)

## 2015-04-09 LAB — IMMUNOFIXATION ELECTROPHORESIS
IgA: 178 mg/dL (ref 69–380)
IgG (Immunoglobin G), Serum: 639 mg/dL — ABNORMAL LOW (ref 690–1700)
IgM, Serum: 1170 mg/dL — ABNORMAL HIGH (ref 52–322)
Total Protein, Serum Electrophoresis: 7.5 g/dL (ref 6.0–8.3)

## 2015-04-12 ENCOUNTER — Telehealth: Payer: Self-pay | Admitting: Family Medicine

## 2015-04-12 DIAGNOSIS — R2689 Other abnormalities of gait and mobility: Secondary | ICD-10-CM | POA: Diagnosis not present

## 2015-04-12 NOTE — Telephone Encounter (Signed)
-----   Message from Cameron Sprang, MD sent at 04/12/2015 12:38 PM EST ----- Pls let her know bloodwork is overall unremarkable, no evidence of inflammation. Thanks

## 2015-04-12 NOTE — Telephone Encounter (Signed)
Patient was notified of results. She also reviewed her lab results on My Chart. She wants to know if there is no evidence of inflammation why does sed rate show level of 54 with normal cut off range being 22?

## 2015-04-13 ENCOUNTER — Telehealth: Payer: Self-pay | Admitting: Gastroenterology

## 2015-04-13 NOTE — Telephone Encounter (Signed)
Discussed blood test results with patient. Discussed that although the sed rate is higher than usual range, with her CRP being normal, I do not see any evidence for temporal arteritis. Discussed that ESR is very non-specific, and with all her medical issues, this can potentially cause it to be higher, but not significant to be concerned for temporal arteritis. Discussed that at this point, it would be medication mgt for headaches, she has started Cymbalta and so far tolerating it except she has noted that she is more gassy and has very foul-smelling gas. She has a test with GI scheduled and will discuss this with GI as well. Continue with plan for low dose Cymbalta, f/u in 2 months.

## 2015-04-14 DIAGNOSIS — R2689 Other abnormalities of gait and mobility: Secondary | ICD-10-CM | POA: Diagnosis not present

## 2015-04-14 NOTE — Telephone Encounter (Signed)
Magda Paganini, can you help me with this?

## 2015-04-20 NOTE — Telephone Encounter (Signed)
cologuard results are being reviewed by Dr. Shelba Flake will call patient with results and any recommendations after Dr. Silverio Decamp is done.

## 2015-04-22 ENCOUNTER — Telehealth: Payer: Self-pay | Admitting: Gastroenterology

## 2015-04-22 NOTE — Telephone Encounter (Signed)
Negative   Result   From Cologuard   Patient states she is having diarrhea wants to know what she should do.Marland KitchenMarland Kitchen

## 2015-04-23 DIAGNOSIS — M25561 Pain in right knee: Secondary | ICD-10-CM | POA: Diagnosis not present

## 2015-04-23 NOTE — Telephone Encounter (Signed)
Please inform patient results of negative results of Cologaurd. If she is having diarrhea with Miralax, please advise her to stop taking it.

## 2015-04-29 LAB — COLOGUARD: Cologuard: NEGATIVE

## 2015-05-05 ENCOUNTER — Encounter: Payer: Self-pay | Admitting: Family Medicine

## 2015-05-05 ENCOUNTER — Ambulatory Visit (INDEPENDENT_AMBULATORY_CARE_PROVIDER_SITE_OTHER): Payer: Medicare Other | Admitting: Family Medicine

## 2015-05-05 VITALS — BP 122/66 | HR 88 | Temp 98.1°F | Wt 178.8 lb

## 2015-05-05 DIAGNOSIS — J988 Other specified respiratory disorders: Secondary | ICD-10-CM | POA: Diagnosis not present

## 2015-05-05 MED ORDER — HYDROCOD POLST-CPM POLST ER 10-8 MG/5ML PO SUER: ORAL | Status: DC

## 2015-05-05 MED ORDER — LEVOFLOXACIN 500 MG PO TABS: 500.0000 mg | ORAL_TABLET | Freq: Every day | ORAL | Status: DC

## 2015-05-05 NOTE — Progress Notes (Signed)
Subjective:  Patient ID: Ashley [name], female    DOB: 03/16/1946  Age: 69 y.o. MRN: [id number]  CC: Sore throat, Sinus pain/pressure, Severe cough  HPI:  69 year old female with a comp located past medical history including chronic systolic heart failure, CKD, & chronic sinusitis presents to the clinic today with the above complaints.  Patient states that she has had a 4 day history of the above complaints. Most recently her cough has been severe and unrelenting. Cough is mildly productive of discolored and thick sputum. No associated fever. Additionally, she continues to have severe sinus pain and pressure. Patient is adamant that she needs antibiotics. No known exacerbating or relieving factors.  Social Hx   Social History   Social History  . Marital Status: Married    Spouse Name: N/A  . Number of Children: 1  . Years of Education: N/A   Occupational History  . 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 [employer]    Social History Main Topics  . Smoking status: Never Smoker   . Smokeless tobacco: Never Used     Comment: Lived with smokers x 23 yrs, smoked herself "for a week"  . Alcohol Use: No  . Drug Use: No  . Sexual Activity: Not Currently   Other Topics Concern  . None   Social History Narrative   1 adopted child      Lives with husband            Review of Systems  Constitutional: Negative for fever.  HENT: Positive for sinus pressure and sore throat.   Respiratory: Positive for cough.    Objective:  BP 122/66 mmHg  Pulse 88  Temp(Src) 98.1 F (36.7 C) (Oral)  Wt 178 lb 12 oz (81.08 kg)  SpO2 96%  BP/Weight 05/05/2015 04/06/2015 [id number]  Systolic BP 123XX123 123456 [id number]  Diastolic BP 66 72 74  Wt. (Lbs) 178.75 176.5 177  BMI 29.28 28.91 29    Physical Exam  Constitutional: No distress.  Appears fatigued.  HENT:  Head: Normocephalic and atraumatic.  Right Ear: External ear normal.  Left Ear: External ear normal.    Mouth/Throat: Oropharynx is clear and moist.  Normal TM's bilaterally. Severe maxillary sinus tenderness to palpation.  Eyes: Conjunctivae are normal.  Neck: Neck supple.  Cardiovascular: Normal rate and regular rhythm.   Pulmonary/Chest: Effort normal. No respiratory distress.  Left basilar crackles noted. Severe cough during exam.  Lymphadenopathy:    She has cervical adenopathy.  Neurological: She is alert.  Vitals reviewed.  Lab Results  Component Value Date   WBC 9.3 04/06/2015   HGB 12.0 04/06/2015   HCT 36.3 04/06/2015   PLT 210.0 04/06/2015   GLUCOSE 97 01/11/2015   CHOL 177 05/27/2014   TRIG 121.0 05/27/2014   HDL 44.20 05/27/2014   LDLDIRECT 240.9 03/13/2014   LDLCALC 109* 05/27/2014   ALT 19 12/31/2014   AST 15 12/31/2014   NA 138 01/11/2015   K 3.9 01/11/2015   CL 104 01/11/2015   CREATININE 1.02 01/11/2015   BUN 19 01/11/2015   CO2 26 01/11/2015   TSH 0.85 04/05/2015   INR 0.92 07/14/2011   HGBA1C 5.7* 02/05/2014   Assessment & Plan:   Problem List Items Addressed This Visit    Respiratory infection - Primary    New problem. Patient with signs of sinusitis and bronchitis. Treating empirically with Levaquin per patient request. Also gave prescription for Tussionex to be used sparingly as patient has  some intolerance (she states she can use it a little and be all right).         Meds ordered this encounter  Medications  . chlorpheniramine-HYDROcodone (TUSSIONEX PENNKINETIC ER) 10-8 MG/5ML SUER    Sig: Use a 1/2 a teaspoon every 12 hours as needed.    Dispense:  115 mL    Refill:  0  . levofloxacin (LEVAQUIN) 500 MG tablet    Sig: Take 1 tablet (500 mg total) by mouth daily.    Dispense:  7 tablet    Refill:  0    Follow-up: PRN  [place]

## 2015-05-05 NOTE — Progress Notes (Signed)
Pre visit review using our clinic review tool, if applicable. No additional management support is needed unless otherwise documented below in the visit note. 

## 2015-05-05 NOTE — Patient Instructions (Signed)
Use the cough syrup as needed.  Take the antibiotic as prescribed.  Follow up if you worsen or fail to improve.  Take care  Dr. Lacinda Axon

## 2015-05-05 NOTE — Assessment & Plan Note (Signed)
New problem. Patient with signs of sinusitis and bronchitis. Treating empirically with Levaquin per patient request. Also gave prescription for Tussionex to be used sparingly as patient has some intolerance (she states she can use it a little and be all right).

## 2015-05-07 DIAGNOSIS — M65332 Trigger finger, left middle finger: Secondary | ICD-10-CM | POA: Diagnosis not present

## 2015-05-13 ENCOUNTER — Encounter: Payer: Self-pay | Admitting: *Deleted

## 2015-05-19 ENCOUNTER — Other Ambulatory Visit: Payer: Medicare Other

## 2015-05-20 ENCOUNTER — Encounter: Payer: Self-pay | Admitting: Gastroenterology

## 2015-05-21 ENCOUNTER — Encounter: Payer: Medicare Other | Admitting: Family Medicine

## 2015-05-31 ENCOUNTER — Other Ambulatory Visit: Payer: Self-pay | Admitting: Family Medicine

## 2015-05-31 DIAGNOSIS — E559 Vitamin D deficiency, unspecified: Secondary | ICD-10-CM

## 2015-05-31 DIAGNOSIS — E7800 Pure hypercholesterolemia, unspecified: Secondary | ICD-10-CM

## 2015-05-31 DIAGNOSIS — I10 Essential (primary) hypertension: Secondary | ICD-10-CM

## 2015-05-31 DIAGNOSIS — R7303 Prediabetes: Secondary | ICD-10-CM

## 2015-05-31 DIAGNOSIS — N183 Chronic kidney disease, stage 3 unspecified: Secondary | ICD-10-CM

## 2015-05-31 DIAGNOSIS — I5022 Chronic systolic (congestive) heart failure: Secondary | ICD-10-CM

## 2015-05-31 DIAGNOSIS — E611 Iron deficiency: Secondary | ICD-10-CM

## 2015-05-31 DIAGNOSIS — G629 Polyneuropathy, unspecified: Secondary | ICD-10-CM

## 2015-06-01 ENCOUNTER — Other Ambulatory Visit (INDEPENDENT_AMBULATORY_CARE_PROVIDER_SITE_OTHER): Payer: Medicare Other

## 2015-06-01 DIAGNOSIS — E7800 Pure hypercholesterolemia, unspecified: Secondary | ICD-10-CM | POA: Diagnosis not present

## 2015-06-01 DIAGNOSIS — E611 Iron deficiency: Secondary | ICD-10-CM

## 2015-06-01 DIAGNOSIS — M65352 Trigger finger, left little finger: Secondary | ICD-10-CM | POA: Diagnosis not present

## 2015-06-01 DIAGNOSIS — R7303 Prediabetes: Secondary | ICD-10-CM

## 2015-06-01 DIAGNOSIS — I10 Essential (primary) hypertension: Secondary | ICD-10-CM

## 2015-06-01 DIAGNOSIS — N183 Chronic kidney disease, stage 3 unspecified: Secondary | ICD-10-CM

## 2015-06-01 DIAGNOSIS — I5022 Chronic systolic (congestive) heart failure: Secondary | ICD-10-CM | POA: Diagnosis not present

## 2015-06-01 DIAGNOSIS — E559 Vitamin D deficiency, unspecified: Secondary | ICD-10-CM | POA: Diagnosis not present

## 2015-06-01 DIAGNOSIS — M65332 Trigger finger, left middle finger: Secondary | ICD-10-CM | POA: Diagnosis not present

## 2015-06-01 LAB — LIPID PANEL
Cholesterol: 218 mg/dL — ABNORMAL HIGH (ref 0–200)
HDL: 41.9 mg/dL (ref >39.00)
NonHDL: 175.6
Total CHOL/HDL Ratio: 5
Triglycerides: 202 mg/dL — ABNORMAL HIGH (ref 0.0–149.0)
VLDL: 40.4 mg/dL — ABNORMAL HIGH (ref 0.0–40.0)

## 2015-06-01 LAB — HEMOGLOBIN A1C: Hgb A1c MFr Bld: 5.8 % (ref 4.6–6.5)

## 2015-06-01 LAB — BASIC METABOLIC PANEL
BUN: 23 mg/dL (ref 6–23)
CO2: 28 mEq/L (ref 19–32)
Calcium: 10.1 mg/dL (ref 8.4–10.5)
Chloride: 102 mEq/L (ref 96–112)
Creatinine, Ser: 1.09 mg/dL (ref 0.40–1.20)
GFR: 52.76 mL/min — ABNORMAL LOW (ref >60.00)
Glucose, Bld: 97 mg/dL (ref 70–99)
Potassium: 4.5 mEq/L (ref 3.5–5.1)
Sodium: 140 mEq/L (ref 135–145)

## 2015-06-01 LAB — FERRITIN: Ferritin: 29.5 ng/mL (ref 10.0–291.0)

## 2015-06-01 LAB — IBC PANEL
Iron: 136 ug/dL (ref 42–145)
Saturation Ratios: 27.8 % (ref 20.0–50.0)
Transferrin: 349 mg/dL (ref 212.0–360.0)

## 2015-06-01 LAB — LDL CHOLESTEROL, DIRECT: Direct LDL: 146 mg/dL

## 2015-06-01 LAB — VITAMIN D 25 HYDROXY (VIT D DEFICIENCY, FRACTURES): VITD: 25.97 ng/mL — ABNORMAL LOW (ref 30.00–100.00)

## 2015-06-02 ENCOUNTER — Other Ambulatory Visit: Payer: Self-pay | Admitting: Family Medicine

## 2015-06-02 NOTE — Telephone Encounter (Signed)
plz phone in. 

## 2015-06-02 NOTE — Telephone Encounter (Signed)
Rx's called in as directed.  

## 2015-06-02 NOTE — Telephone Encounter (Signed)
Ok to refill 

## 2015-06-04 ENCOUNTER — Telehealth: Payer: Self-pay | Admitting: *Deleted

## 2015-06-04 NOTE — Telephone Encounter (Signed)
PA required for Ambien. Sent in and approved.

## 2015-06-08 ENCOUNTER — Encounter: Payer: Self-pay | Admitting: Family Medicine

## 2015-06-08 ENCOUNTER — Ambulatory Visit (INDEPENDENT_AMBULATORY_CARE_PROVIDER_SITE_OTHER): Payer: Medicare Other | Admitting: Family Medicine

## 2015-06-08 VITALS — BP 116/78 | HR 88 | Temp 97.8°F | Wt 173.8 lb

## 2015-06-08 DIAGNOSIS — Z23 Encounter for immunization: Secondary | ICD-10-CM

## 2015-06-08 DIAGNOSIS — Z0000 Encounter for general adult medical examination without abnormal findings: Secondary | ICD-10-CM | POA: Diagnosis not present

## 2015-06-08 DIAGNOSIS — R5382 Chronic fatigue, unspecified: Secondary | ICD-10-CM

## 2015-06-08 DIAGNOSIS — N183 Chronic kidney disease, stage 3 unspecified: Secondary | ICD-10-CM

## 2015-06-08 DIAGNOSIS — G629 Polyneuropathy, unspecified: Secondary | ICD-10-CM

## 2015-06-08 DIAGNOSIS — M797 Fibromyalgia: Secondary | ICD-10-CM

## 2015-06-08 DIAGNOSIS — E7800 Pure hypercholesterolemia, unspecified: Secondary | ICD-10-CM

## 2015-06-08 DIAGNOSIS — E559 Vitamin D deficiency, unspecified: Secondary | ICD-10-CM

## 2015-06-08 DIAGNOSIS — I5022 Chronic systolic (congestive) heart failure: Secondary | ICD-10-CM

## 2015-06-08 DIAGNOSIS — Z7189 Other specified counseling: Secondary | ICD-10-CM | POA: Insufficient documentation

## 2015-06-08 MED ORDER — EZETIMIBE 10 MG PO TABS: 10.0000 mg | ORAL_TABLET | Freq: Every day | ORAL | Status: DC

## 2015-06-08 MED ORDER — VITAMIN D3 1.25 MG (50000 UT) PO TABS: 1.0000 | ORAL_TABLET | ORAL | Status: DC

## 2015-06-08 MED ORDER — FERROUS SULFATE ER 143 (45 FE) MG PO TBCR: 1.0000 | EXTENDED_RELEASE_TABLET | Freq: Every day | ORAL | Status: DC

## 2015-06-08 NOTE — Assessment & Plan Note (Signed)
Chronic. Reviewed labs with patient.

## 2015-06-08 NOTE — Assessment & Plan Note (Signed)

## 2015-06-08 NOTE — Assessment & Plan Note (Signed)
Advanced directive discussion - has at home. Husband is HCPOA. Will bring me copy

## 2015-06-08 NOTE — Assessment & Plan Note (Signed)
Has seen neurology. Not tolerating cymbalta. Has pending f/u with neuro.

## 2015-06-08 NOTE — Assessment & Plan Note (Signed)
Vit  D low - start 50,000 IU weekly x 3 months then reassess.

## 2015-06-08 NOTE — Assessment & Plan Note (Signed)
Stable period. Seems euvolemic.

## 2015-06-08 NOTE — Progress Notes (Signed)
BP 116/78 mmHg  Pulse 88  Temp(Src) 97.8 F (36.6 C) (Oral)  Wt 173 lb 12 oz (78.812 kg)   CC: medicare wellness visit  Subjective:    Patient ID: Ashley [name], female    DOB: [birth year], 70 y.o.   MRN: [id number]  HPI: Ashley [name] is a 70 y.o. female presenting on 06/08/2015 for Annual Exam   Not tolerating duloxetine - causing jerking evern at low dose. Also throat stays sore.  Grinding of teeth - saw dentist and started using night guard.   Hearing screen - passed Vision screen - [facility] 2016 Fall risk screen - 1. One in yard while planting chrysanthiums. sxs better with decreased bp medicine.  Depression screen - passed   Preventative: Colon cancer screening - ColoGuard 04/2015 negative. F/u scheduled with GI 07/2015, considering colonosocpy Lung cancer screening - not indicated Breast cancer screening - normal 01/2014 DUE pt states she had this done 2016 - records requested Well woman exam - s/p abd hysterectomy with BSO DEXA Date: 01/2013 T score -2.6 at spine OSTEOPOROSIS - pt states she had this done 2016 and normal - records requested Flu shot - declines - egg allergy Td 2007 Pneumovax 2013, prevnar today Shingles shot - declines Advanced directive discussion - has at home. Husband is HCPOA. Will bring me copy Seat belt use discussed Sunscreen use and skin screen discussed. No changing moles on skin. Upcoming appt with derm.   Lives with husband. 1 adopted child.  Activity: no regular exercise Diet: seldom water, good fruits/vegetables  Relevant past medical, surgical, family and social history reviewed and updated as indicated. Interim medical history since our last visit reviewed. Allergies and medications reviewed and updated. Current Outpatient Prescriptions on File Prior to Visit  Medication Sig  . acetaminophen (TYLENOL) 500 MG tablet Take 500 mg by mouth daily.   Marland Kitchen alendronate (FOSAMAX) 70 MG tablet Take 70 mg by mouth once a  week. Patient takes every Thursday  . ALPRAZolam (XANAX) 0.25 MG tablet TAKE 1 TABLET TWICE A DAY AS NEEDED  . Ascorbic Acid (VITAMIN C) 1000 MG tablet Take 1,000 mg by mouth daily.  Marland Kitchen aspirin EC 81 MG tablet Take 81 mg by mouth daily.  Marland Kitchen atorvastatin (LIPITOR) 40 MG tablet TAKE 1 TABLET (40 MG TOTAL) BY MOUTH EVERY OTHER DAY. (Patient taking differently: TAKE 1 TABLET (40 MG TOTAL) BY MOUTH EVERY TWO DAYS.)  . Calcium Carbonate-Vitamin D (CALCIUM + D PO) Take 1 tablet by mouth daily.  . carvedilol (COREG) 6.25 MG tablet Take 1 tablet (6.25 mg total) by mouth 2 (two) times daily with a meal.  . ketoconazole (NIZORAL) 2 % cream Apply 1 application topically daily.  . mometasone (ELOCON) 0.1 % lotion Apply 1 application topically as needed (for eczema). Reported on 05/05/2015  . omeprazole (PRILOSEC) 40 MG capsule TAKE ONE CAPSULE BY MOUTH TWICE A DAY BEFORE MEALS  . polyethylene glycol (MIRALAX / GLYCOLAX) packet Take 17 g by mouth daily.   . Probiotic Product (PROBIOTIC DAILY) CAPS Take 1 capsule by mouth daily.  . Pyridoxine HCl (VITAMIN B6 PO) Take 1 capsule by mouth daily.  Marland Kitchen spironolactone (ALDACTONE) 12.5 mg TABS tablet Take 12.5 mg by mouth daily.  Marland Kitchen triamcinolone (NASACORT) 55 MCG/ACT AERO nasal inhaler Place 2 sprays into the nose 2 (two) times daily.  . vitamin E 100 UNIT capsule Take 100 Units by mouth daily.  Marland Kitchen zolpidem (AMBIEN) 5 MG tablet TAKE 1/2 TO 1 TABLET  AT BEDTIME AS NEEDED  . [DISCONTINUED] bisoprolol (ZEBETA) 5 MG tablet Take 5 mg by mouth daily.   No current facility-administered medications on file prior to visit.    Review of Systems Per HPI unless specifically indicated in ROS section     Objective:    BP 116/78 mmHg  Pulse 88  Temp(Src) 97.8 F (36.6 C) (Oral)  Wt 173 lb 12 oz (78.812 kg)  Wt Readings from Last 3 Encounters:  06/08/15 173 lb 12 oz (78.812 kg)  05/05/15 178 lb 12 oz (81.08 kg)  04/06/15 176 lb 8 oz (80.06 kg)    Physical Exam    Constitutional: She is oriented to person, place, and time. She appears well-developed and well-nourished. No distress.  HENT:  Head: Normocephalic and atraumatic.  Right Ear: Hearing, tympanic membrane, external ear and ear canal normal.  Left Ear: Hearing, tympanic membrane, external ear and ear canal normal.  Nose: Nose normal.  Mouth/Throat: Uvula is midline, oropharynx is clear and moist and mucous membranes are normal. No oropharyngeal exudate, posterior oropharyngeal edema or posterior oropharyngeal erythema.  Eyes: Conjunctivae and EOM are normal. Pupils are equal, round, and reactive to light. No scleral icterus.  Neck: Normal range of motion. Neck supple. Carotid bruit is not present. No thyromegaly present.  Cardiovascular: Normal rate, regular rhythm, normal heart sounds and intact distal pulses.   No murmur heard. Pulses:      Radial pulses are 2+ on the right side, and 2+ on the left side.  Pulmonary/Chest: Effort normal and breath sounds normal. No respiratory distress. She has no wheezes. She has no rales.  Abdominal: Soft. Bowel sounds are normal. She exhibits no distension and no mass. There is no tenderness. There is no rebound and no guarding.  Musculoskeletal: Normal range of motion. She exhibits no edema.  Lymphadenopathy:    She has no cervical adenopathy.  Neurological: She is alert and oriented to person, place, and time.  CN grossly intact, station and gait intact  Skin: Skin is warm and dry. No rash noted.  Psychiatric: She has a normal mood and affect. Her behavior is normal. Judgment and thought content normal.  Nursing note and vitals reviewed.  Results for orders placed or performed in visit on 06/08/15  Cologuard  Result Value Ref Range   Cologuard Negative       Assessment & Plan:  Hep C screen next lab draw Problem List Items Addressed This Visit    Vitamin D deficiency    Vit  D low - start 50,000 IU weekly x 3 months then reassess.       Neuropathy ([place])    Has seen neurology. Not tolerating cymbalta. Has pending f/u with neuro.      Medicare annual wellness visit, subsequent - Primary    I have personally reviewed the Medicare Annual Wellness questionnaire and have noted 1. The patient's medical and social history 2. Their use of alcohol, tobacco or illicit drugs 3. Their current medications and supplements 4. The patient's functional ability including ADL's, fall risks, home safety risks and hearing or visual impairment. Cognitive function has been assessed and addressed as indicated.  5. Diet and physical activity 6. Evidence for depression or mood disorders The patients weight, height, BMI have been recorded in the chart. I have made referrals, counseling and provided education to the patient based on review of the above and I have provided the pt with a written personalized care plan for preventive services. Provider list updated.Marland Kitchen  See scanned questionairre as needed for further documentation. Reviewed preventative protocols and updated unless pt declined.       HYPERCHOLESTEROLEMIA, PURE    Statin intolerance. Trial zetia.      Relevant Medications   ezetimibe (ZETIA) 10 MG tablet   Fibromyalgia    Chronic issue with continued pain.      CKD (chronic kidney disease) stage 3, GFR 30-59 ml/min    Chronic. Reviewed labs with patient.      Chronic systolic CHF (congestive heart failure) (HCC)    Stable period. Seems euvolemic.       Relevant Medications   ezetimibe (ZETIA) 10 MG tablet   Chronic fatigue    Chronic issue. Stable. Replace vit D with weekly supplementation      Advanced care planning/counseling discussion    Advanced directive discussion - has at home. Husband is HCPOA. Will bring me copy          Follow up plan: Return in about 3 months (around 09/06/2015), or as needed, for follow up visit.

## 2015-06-08 NOTE — Assessment & Plan Note (Signed)
Chronic issue with continued pain.

## 2015-06-08 NOTE — Addendum Note (Signed)
Addended by: Royann Shivers A on: 06/08/2015 12:36 PM   Modules accepted: Orders

## 2015-06-08 NOTE — Patient Instructions (Addendum)
Prevnar today Sign release for records from [place] or [place] for last year's mammogram and bone density scan records. Bring me copy of your living will to update your chart. Trial zetia for cholesterol i've refilled slow release iron. Return as needed or in 3 months for follow up Hep C screen next visit  Health Maintenance, Female Adopting a healthy lifestyle and getting preventive care can go a long way to promote health and wellness. Talk with your health care provider about what schedule of regular examinations is right for you. This is a good chance for you to check in with your provider about disease prevention and staying healthy. In between checkups, there are plenty of things you can do on your own. Experts have done a lot of research about which lifestyle changes and preventive measures are most likely to keep you healthy. Ask your health care provider for more information. WEIGHT AND DIET  Eat a healthy diet  Be sure to include plenty of vegetables, fruits, low-fat dairy products, and lean protein.  Do not eat a lot of foods high in solid fats, added sugars, or salt.  Get regular exercise. This is one of the most important things you can do for your health.  Most adults should exercise for at least 150 minutes each week. The exercise should increase your heart rate and make you sweat (moderate-intensity exercise).  Most adults should also do strengthening exercises at least twice a week. This is in addition to the moderate-intensity exercise.  Maintain a healthy weight  Body mass index (BMI) is a measurement that can be used to identify possible weight problems. It estimates body fat based on height and weight. Your health care provider can help determine your BMI and help you achieve or maintain a healthy weight.  For females 49 years of age and older:   A BMI below 18.5 is considered underweight.  A BMI of 18.5 to 24.9 is normal.  A BMI of 25 to 29.9 is  considered overweight.  A BMI of 30 and above is considered obese.  Watch levels of cholesterol and blood lipids  You should start having your blood tested for lipids and cholesterol at 70 years of age, then have this test every 5 years.  You may need to have your cholesterol levels checked more often if:  Your lipid or cholesterol levels are high.  You are older than 70 years of age.  You are at high risk for heart disease.  CANCER SCREENING   Lung Cancer  Lung cancer screening is recommended for adults 38-62 years old who are at high risk for lung cancer because of a history of smoking.  A yearly low-dose CT scan of the lungs is recommended for people who:  Currently smoke.  Have quit within the past 15 years.  Have at least a 30-pack-year history of smoking. A pack year is smoking an average of one pack of cigarettes a day for 1 year.  Yearly screening should continue until it has been 15 years since you quit.  Yearly screening should stop if you develop a health problem that would prevent you from having lung cancer treatment.  Breast Cancer  Practice breast self-awareness. This means understanding how your breasts normally appear and feel.  It also means doing regular breast self-exams. Let your health care provider know about any changes, no matter how small.  If you are in your 20s or 30s, you should have a clinical breast exam (CBE) by a  health care provider every 1-3 years as part of a regular health exam.  If you are 60 or older, have a CBE every year. Also consider having a breast X-ray (mammogram) every year.  If you have a family history of breast cancer, talk to your health care provider about genetic screening.  If you are at high risk for breast cancer, talk to your health care provider about having an MRI and a mammogram every year.  Breast cancer gene (BRCA) assessment is recommended for women who have family members with BRCA-related cancers.  BRCA-related cancers include:  Breast.  Ovarian.  Tubal.  Peritoneal cancers.  Results of the assessment will determine the need for genetic counseling and BRCA1 and BRCA2 testing. Cervical Cancer Your health care provider may recommend that you be screened regularly for cancer of the pelvic organs (ovaries, uterus, and vagina). This screening involves a pelvic examination, including checking for microscopic changes to the surface of your cervix (Pap test). You may be encouraged to have this screening done every 3 years, beginning at age 54.  For women ages 19-65, health care providers may recommend pelvic exams and Pap testing every 3 years, or they may recommend the Pap and pelvic exam, combined with testing for human papilloma virus (HPV), every 5 years. Some types of HPV increase your risk of cervical cancer. Testing for HPV may also be done on women of any age with unclear Pap test results.  Other health care providers may not recommend any screening for nonpregnant women who are considered low risk for pelvic cancer and who do not have symptoms. Ask your health care provider if a screening pelvic exam is right for you.  If you have had past treatment for cervical cancer or a condition that could lead to cancer, you need Pap tests and screening for cancer for at least 20 years after your treatment. If Pap tests have been discontinued, your risk factors (such as having a new sexual partner) need to be reassessed to determine if screening should resume. Some women have medical problems that increase the chance of getting cervical cancer. In these cases, your health care provider may recommend more frequent screening and Pap tests. Colorectal Cancer  This type of cancer can be detected and often prevented.  Routine colorectal cancer screening usually begins at 70 years of age and continues through 70 years of age.  Your health care provider may recommend screening at an earlier age if you  have risk factors for colon cancer.  Your health care provider may also recommend using home test kits to check for hidden blood in the stool.  A small camera at the end of a tube can be used to examine your colon directly (sigmoidoscopy or colonoscopy). This is done to check for the earliest forms of colorectal cancer.  Routine screening usually begins at age 32.  Direct examination of the colon should be repeated every 5-10 years through 70 years of age. However, you may need to be screened more often if early forms of precancerous polyps or small growths are found. Skin Cancer  Check your skin from head to toe regularly.  Tell your health care provider about any new moles or changes in moles, especially if there is a change in a mole's shape or color.  Also tell your health care provider if you have a mole that is larger than the size of a pencil eraser.  Always use sunscreen. Apply sunscreen liberally and repeatedly throughout the day.  Protect yourself by wearing long sleeves, pants, a wide-brimmed hat, and sunglasses whenever you are outside. HEART DISEASE, DIABETES, AND HIGH BLOOD PRESSURE   High blood pressure causes heart disease and increases the risk of stroke. High blood pressure is more likely to develop in:  People who have blood pressure in the high end of the normal range (130-139/85-89 mm Hg).  People who are overweight or obese.  People who are African American.  If you are 29-4 years of age, have your blood pressure checked every 3-5 years. If you are 42 years of age or older, have your blood pressure checked every year. You should have your blood pressure measured twice--once when you are at a hospital or clinic, and once when you are not at a hospital or clinic. Record the average of the two measurements. To check your blood pressure when you are not at a hospital or clinic, you can use:  An automated blood pressure machine at a pharmacy.  A home blood pressure  monitor.  If you are between 27 years and 61 years old, ask your health care provider if you should take aspirin to prevent strokes.  Have regular diabetes screenings. This involves taking a blood sample to check your fasting blood sugar level.  If you are at a normal weight and have a low risk for diabetes, have this test once every three years after 70 years of age.  If you are overweight and have a high risk for diabetes, consider being tested at a younger age or more often. PREVENTING INFECTION  Hepatitis B  If you have a higher risk for hepatitis B, you should be screened for this virus. You are considered at high risk for hepatitis B if:  You were born in a country where hepatitis B is common. Ask your health care provider which countries are considered high risk.  Your parents were born in a high-risk country, and you have not been immunized against hepatitis B (hepatitis B vaccine).  You have HIV or AIDS.  You use needles to inject street drugs.  You live with someone who has hepatitis B.  You have had sex with someone who has hepatitis B.  You get hemodialysis treatment.  You take certain medicines for conditions, including cancer, organ transplantation, and autoimmune conditions. Hepatitis C  Blood testing is recommended for:  Everyone born from [birth year] through 1965.  Anyone with known risk factors for hepatitis C. Sexually transmitted infections (STIs)  You should be screened for sexually transmitted infections (STIs) including gonorrhea and chlamydia if:  You are sexually active and are younger than 70 years of age.  You are older than 70 years of age and your health care provider tells you that you are at risk for this type of infection.  Your sexual activity has changed since you were last screened and you are at an increased risk for chlamydia or gonorrhea. Ask your health care provider if you are at risk.  If you do not have HIV, but are at risk, it may be  recommended that you take a prescription medicine daily to prevent HIV infection. This is called pre-exposure prophylaxis (PrEP). You are considered at risk if:  You are sexually active and do not regularly use condoms or know the HIV status of your partner(s).  You take drugs by injection.  You are sexually active with a partner who has HIV. Talk with your health care provider about whether you are at high risk of being infected  with HIV. If you choose to begin PrEP, you should first be tested for HIV. You should then be tested every 3 months for as long as you are taking PrEP.  PREGNANCY   If you are premenopausal and you may become pregnant, ask your health care provider about preconception counseling.  If you may become pregnant, take 400 to 800 micrograms (mcg) of folic acid every day.  If you want to prevent pregnancy, talk to your health care provider about birth control (contraception). OSTEOPOROSIS AND MENOPAUSE   Osteoporosis is a disease in which the bones lose minerals and strength with aging. This can result in serious bone fractures. Your risk for osteoporosis can be identified using a bone density scan.  If you are 30 years of age or older, or if you are at risk for osteoporosis and fractures, ask your health care provider if you should be screened.  Ask your health care provider whether you should take a calcium or vitamin D supplement to lower your risk for osteoporosis.  Menopause may have certain physical symptoms and risks.  Hormone replacement therapy may reduce some of these symptoms and risks. Talk to your health care provider about whether hormone replacement therapy is right for you.  HOME CARE INSTRUCTIONS   Schedule regular health, dental, and eye exams.  Stay current with your immunizations.   Do not use any tobacco products including cigarettes, chewing tobacco, or electronic cigarettes.  If you are pregnant, do not drink alcohol.  If you are  breastfeeding, limit how much and how often you drink alcohol.  Limit alcohol intake to no more than 1 drink per day for nonpregnant women. One drink equals 12 ounces of beer, 5 ounces of wine, or 1 ounces of hard liquor.  Do not use street drugs.  Do not share needles.  Ask your health care provider for help if you need support or information about quitting drugs.  Tell your health care provider if you often feel depressed.  Tell your health care provider if you have ever been abused or do not feel safe at home.   This information is not intended to replace advice given to you by your health care provider. Make sure you discuss any questions you have with your health care provider.   Document Released: 11/14/2010 Document Revised: 05/22/2014 Document Reviewed: 04/02/2013 Elsevier Interactive Patient Education [employer].

## 2015-06-08 NOTE — Progress Notes (Signed)
Pre visit review using our clinic review tool, if applicable. No additional management support is needed unless otherwise documented below in the visit note. 

## 2015-06-08 NOTE — Assessment & Plan Note (Signed)
Chronic issue. Stable. Replace vit D with weekly supplementation

## 2015-06-08 NOTE — Assessment & Plan Note (Signed)
Statin intolerance. Trial zetia.

## 2015-06-09 ENCOUNTER — Telehealth: Payer: Self-pay

## 2015-06-09 ENCOUNTER — Telehealth: Payer: Self-pay | Admitting: Family Medicine

## 2015-06-09 NOTE — Telephone Encounter (Signed)
Ok to change - 50,000 IU vit D2 weekly.

## 2015-06-09 NOTE — Telephone Encounter (Signed)
Pt has appt to see Dr Darnell Level on 06/10/15 at 11:15.

## 2015-06-09 NOTE — Telephone Encounter (Signed)
[facility]  Patient Name: Ashley [name]  DOB: [birth year]    Initial Comment Caller states she had physical and pneumonia shot yesterday, has been coughing since. usually prescribed levaquin for bronchitis   Nurse Assessment  Nurse: Wayne Sever, RN, Tillie Rung Date/Time [name] Time): 06/09/2015 10:44:13 AM  Confirm and document reason for call. If symptomatic, describe symptoms. You must click the next button to save text entered. ---Caller states she is coughing today, she states it started after the pneumonia shot. She states she had wellness exam yesterday and the pneumonia shot. She had pneumonia three times in one year per caller. She state she cannot take bronchitis without some help from antibiotic, she is asking for Levaquin.  Has the patient traveled out of the country within the last 30 days? ---Not Applicable  Does the patient have any new or worsening symptoms? ---Yes  Will a triage be completed? ---Yes  Related visit to physician within the last 2 weeks? ---No  Does the PT have any chronic conditions? (i.e. diabetes, asthma, etc.) ---Yes  List chronic conditions. ---Pacemaker, Defibrillator, CHF, Pre-diabetes, Colon Surgery,  Is this a behavioral health or substance abuse call? ---No     Guidelines    Guideline Title Affirmed Question Affirmed Notes  Immunization Reactions Pneumococcal vaccine reactions (all triage questions negative)   Cough - Acute Productive [1] Known COPD or other severe lung disease (i.e., bronchiectasis, cystic fibrosis, lung surgery) AND [2] worsening symptoms (i.e., increased sputum purulence or amount, increased breathing difficulty    Final Disposition User   See Physician within 24 Hours Wayne Sever, RN, [employer]    Referrals  REFERRED TO PCP OFFICE       Disagree/Comply: Leta Baptist

## 2015-06-09 NOTE — Telephone Encounter (Signed)
[facility] [address] left v/m and received rx for Vit D 3; [facility] has Vit D 2 and wants to know if can change from Vit D 3 to Vit D 2.Please advise.

## 2015-06-09 NOTE — Telephone Encounter (Signed)
Will see then. 

## 2015-06-09 NOTE — Telephone Encounter (Signed)
Pharmacy notified.

## 2015-06-10 ENCOUNTER — Encounter: Payer: Self-pay | Admitting: Family Medicine

## 2015-06-10 ENCOUNTER — Ambulatory Visit (INDEPENDENT_AMBULATORY_CARE_PROVIDER_SITE_OTHER): Payer: Medicare Other | Admitting: Family Medicine

## 2015-06-10 ENCOUNTER — Ambulatory Visit (INDEPENDENT_AMBULATORY_CARE_PROVIDER_SITE_OTHER)
Admission: RE | Admit: 2015-06-10 | Discharge: 2015-06-10 | Disposition: A | Discharge status: Home / Self Care | Payer: Medicare Other | Source: Ambulatory Visit | Attending: Family Medicine | Admitting: Family Medicine

## 2015-06-10 VITALS — BP 122/70 | HR 100 | Temp 97.7°F | Wt 175.0 lb

## 2015-06-10 DIAGNOSIS — R05 Cough: Secondary | ICD-10-CM

## 2015-06-10 DIAGNOSIS — T50A95A Adverse effect of other bacterial vaccines, initial encounter: Secondary | ICD-10-CM | POA: Diagnosis not present

## 2015-06-10 DIAGNOSIS — R059 Cough, unspecified: Secondary | ICD-10-CM

## 2015-06-10 DIAGNOSIS — T887XXA Unspecified adverse effect of drug or medicament, initial encounter: Secondary | ICD-10-CM

## 2015-06-10 MED ORDER — HYDROCOD POLST-CPM POLST ER 10-8 MG/5ML PO SUER: 5.0000 mL | Freq: Every evening | ORAL | Status: DC | PRN

## 2015-06-10 MED ORDER — AZITHROMYCIN 250 MG PO TABS: ORAL_TABLET | ORAL | Status: DC

## 2015-06-10 MED ORDER — PREDNISONE 20 MG PO TABS: ORAL_TABLET | ORAL | Status: DC

## 2015-06-10 NOTE — Patient Instructions (Signed)
I think this is immune response to pneunomia shot.  Treat with tussionex for cough and prednisone course. zpack to hold on to in case fever >101, worsening productive cough after steroid.  Xray of chest.

## 2015-06-10 NOTE — Progress Notes (Signed)
BP 122/70 mmHg  Pulse 100  Temp(Src) 97.7 F (36.5 C) (Oral)  Wt 175 lb (79.379 kg)  SpO2 97%   CC: cough  Subjective:    Patient ID: [name], female    DOB: [birth year], 70 y.o.   MRN: [id number]  HPI: YUMALAI GRITZ is a 70 y.o. female presenting on 06/10/2015 for Cough   Received prevnar 06/08/2015. Within 2 hours of immunization started noticing cough described as productive cough with green sputum as well as cough fits. This morning lost voice, heavy legs, knee aching and fatigue. Having chest congestion without head congestion. Lungs and back achey. Has been treating with small sips of codeine cough syrup. tachyarrythmias with HR up to 120s. Chest sore with coughing. Some dizziness and unsteadiness.   No fevers/chills. No PNdrainage or ST. Checking O2 staying high 90s.   Received pneumovax 2013 without side effects or adverse events.   Relevant past medical, surgical, family and social history reviewed and updated as indicated. Interim medical history since our last visit reviewed. Allergies and medications reviewed and updated. Current Outpatient Prescriptions on File Prior to Visit  Medication Sig  . acetaminophen (TYLENOL) 500 MG tablet Take 500 mg by mouth daily.   Marland Kitchen alendronate (FOSAMAX) 70 MG tablet Take 70 mg by mouth once a week. Patient takes every Thursday  . ALPRAZolam (XANAX) 0.25 MG tablet TAKE 1 TABLET TWICE A DAY AS NEEDED  . Ascorbic Acid (VITAMIN C) 1000 MG tablet Take 1,000 mg by mouth daily.  Marland Kitchen aspirin EC 81 MG tablet Take 81 mg by mouth daily.  Marland Kitchen atorvastatin (LIPITOR) 40 MG tablet TAKE 1 TABLET (40 MG TOTAL) BY MOUTH EVERY OTHER DAY. (Patient taking differently: TAKE 1 TABLET (40 MG TOTAL) BY MOUTH EVERY TWO DAYS.)  . Calcium Carbonate-Vitamin D (CALCIUM + D PO) Take 1 tablet by mouth daily.  . carvedilol (COREG) 6.25 MG tablet Take 1 tablet (6.25 mg total) by mouth 2 (two) times daily with a meal.  . Cholecalciferol (VITAMIN D3) 50000 units  TABS Take 1 tablet by mouth once a week.  . ezetimibe (ZETIA) 10 MG tablet Take 1 tablet (10 mg total) by mouth daily.  . Ferrous Sulfate ([facility] SLOW RELEASE IRON) 143 (45 Fe) MG TBCR Take 1 tablet by mouth daily.  Marland Kitchen ketoconazole (NIZORAL) 2 % cream Apply 1 application topically daily.  . mometasone (ELOCON) 0.1 % lotion Apply 1 application topically as needed (for eczema). Reported on 05/05/2015  . omeprazole (PRILOSEC) 40 MG capsule TAKE ONE CAPSULE BY MOUTH TWICE A DAY BEFORE MEALS  . polyethylene glycol (MIRALAX / GLYCOLAX) packet Take 17 g by mouth daily.   . Probiotic Product (PROBIOTIC DAILY) CAPS Take 1 capsule by mouth daily.  . Pyridoxine HCl (VITAMIN B6 PO) Take 1 capsule by mouth daily.  Marland Kitchen spironolactone (ALDACTONE) 12.5 mg TABS tablet Take 12.5 mg by mouth daily.  Marland Kitchen triamcinolone (NASACORT) 55 MCG/ACT AERO nasal inhaler Place 2 sprays into the nose 2 (two) times daily.  . vitamin E 100 UNIT capsule Take 100 Units by mouth daily.  Marland Kitchen zolpidem (AMBIEN) 5 MG tablet TAKE 1/2 TO 1 TABLET AT BEDTIME AS NEEDED  . [DISCONTINUED] bisoprolol (ZEBETA) 5 MG tablet Take 5 mg by mouth daily.   No current facility-administered medications on file prior to visit.    Review of Systems Per HPI unless specifically indicated in ROS section     Objective:    BP 122/70 mmHg  Pulse 100  Temp(Src) 97.7  F (36.5 C) (Oral)  Wt 175 lb (79.379 kg)  SpO2 97%  Wt Readings from Last 3 Encounters:  06/10/15 175 lb (79.379 kg)  06/08/15 173 lb 12 oz (78.812 kg)  05/05/15 178 lb 12 oz (81.08 kg)    Physical Exam  Constitutional: She appears well-developed and well-nourished. No distress.  Tired appearing  HENT:  Head: Normocephalic and atraumatic.  Right Ear: Hearing normal.  Left Ear: Hearing normal.  Nose: No mucosal edema or rhinorrhea.  Mouth/Throat: Uvula is midline, oropharynx is clear and moist and mucous membranes are normal. No oropharyngeal exudate, posterior oropharyngeal edema,  posterior oropharyngeal erythema or tonsillar abscesses.  Eyes: Conjunctivae and EOM are normal. Pupils are equal, round, and reactive to light. No scleral icterus.  Neck: Normal range of motion. Neck supple.  Cardiovascular: Normal rate, regular rhythm, normal heart sounds and intact distal pulses.   No murmur heard. Pulmonary/Chest: Effort normal and breath sounds normal. No respiratory distress. She has no wheezes. She has no rales.  Lungs clear but severe deep hacking coughing fits present  Lymphadenopathy:    She has no cervical adenopathy.  Skin: Skin is warm and dry. No rash noted.  Nursing note and vitals reviewed.     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 Discussed etiology and treatment plan. tussionex for night time cough/rest Prednisone taper zpack WASP - only fill if fever >101, worsening productive cough despite steroid.  Update if not improving with treatment. Pt/husband agree with plan.      Relevant Orders   DG Chest 2 View   Cough   Relevant Orders   DG Chest 2 View       Follow up plan: Return if symptoms worsen or fail to improve.

## 2015-06-10 NOTE — Progress Notes (Signed)
Pre visit review using our clinic review tool, if applicable. No additional management support is needed unless otherwise documented below in the visit note. 

## 2015-06-10 NOTE — Assessment & Plan Note (Signed)
Anticipate symptoms and malaise stemming from robust immune response to prevnar given 2d ago. Discussed etiology and treatment plan. tussionex for night time cough/rest Prednisone taper zpack WASP - only fill if fever >101, worsening productive cough despite steroid.  Update if not improving with treatment. Pt/husband agree with plan.

## 2015-06-15 DIAGNOSIS — M65352 Trigger finger, left little finger: Secondary | ICD-10-CM | POA: Diagnosis not present

## 2015-06-15 DIAGNOSIS — M65332 Trigger finger, left middle finger: Secondary | ICD-10-CM | POA: Diagnosis not present

## 2015-06-15 DIAGNOSIS — M1812 Unilateral primary osteoarthritis of first carpometacarpal joint, left hand: Secondary | ICD-10-CM | POA: Diagnosis not present

## 2015-06-16 ENCOUNTER — Encounter: Payer: Self-pay | Admitting: Neurology

## 2015-06-16 ENCOUNTER — Ambulatory Visit (INDEPENDENT_AMBULATORY_CARE_PROVIDER_SITE_OTHER): Payer: Medicare Other | Admitting: Neurology

## 2015-06-16 VITALS — BP 104/70 | HR 85 | Resp 18 | Wt 177.0 lb

## 2015-06-16 DIAGNOSIS — R51 Headache: Secondary | ICD-10-CM

## 2015-06-16 DIAGNOSIS — R519 Headache, unspecified: Secondary | ICD-10-CM

## 2015-06-16 DIAGNOSIS — G629 Polyneuropathy, unspecified: Secondary | ICD-10-CM | POA: Diagnosis not present

## 2015-06-16 NOTE — Patient Instructions (Signed)
1. Call our office once you are feeling better, and we will send in the prescription for Gabapentin. We will start on a very low dose of Gabapentin 100mg  1 capsule at bedtime for 2 weeks, then increase to 1 capsule twice a day and monitor response 2. Minimize Tylenol intake as much as you can to 2-3 a week to avoid rebound headaches 3. Follow-up in 2 months 4. Hang in there!

## 2015-06-16 NOTE — Progress Notes (Signed)
NEUROLOGY FOLLOW UP OFFICE NOTE  Ashley [name] [id number]  HISTORY OF PRESENT ILLNESS: I had the pleasure of seeing Ashley [name] in follow-up in the neurology clinic on 06/16/2015.  The patient was last seen 2 months ago for chronic daily headaches and dizziness. She is accompanied by her husband who helps supplement the history today.  On her last visit, she was started on Cymbalta for the headaches. She had side effects of jerking ("both externally and inside my brain"). She stopped the medication 3-4 days ago. She reports that she is having less headaches, even off the Cymbalta. She is still taking Tylenol three times a day due to significant arthritis pain and fibromyalgia. She reports having a bad reaction to the Prevar shot, soon after she was coughing up green sputum and became hoarse, with generalized malaise. She was given prednisone, Z-pack, and codeine syrup. She still is not feeling well with purulent sputum. She reports the dizziness improved with adjustment of carvedilol dose. She denies any falls. She has chronic paresthesias in her feet, and reports pain in her right bunion and 2nd toe after wearing orthotics.  HPI 04/12/15: This is a 70 yo RH woman with multiple medical problems including hyperlipidemia, non-ischemic cardiomyopathy s/p pacemaker placement, diverticulosis, diverticulitis with perforation, migraines, who presented for headaches and dizziness. She reports that headaches started to worsen 4-5 years ago after she had laser surgery in her left eye twice. She kept going back to her eye doctor because of feeling sand in her left eye and blurred vision, saw several eye specialists and had been told there was no clear reason for her symptoms except extreme dry eye. They eye drops however would cause more headaches and worse blurred vision. Headaches are on the left side of her head, over the left temporal and frontal region and behind the left eye. She describes  throbbing pain, she reports an average of 15-20 headaches a week, although some days she has no headaches, other days she has 6-8 headaches in a day lasting a couple of minutes to a couple of hours. Her vision would occasionally get blurred. She has been taking Tylenol 2-3 times daily for the past 8-12 months, this helps with her other aches and pains due to arthritis as well. She had been told in the past that she has a "cousin of migraines." She occasionally has loss of vision in her left eye, she would be eating then bends her head forward and cannot see her plate for a few seconds. She has noticed left eye tearing but no conjunctival injection or ptosis. She had a bad year last year and reports that "nothing has been right since." She had been dealing with abdominal abscesses and wound infections, and feels that since then, arthritis in her hands, joint problems, carpal tunnel syndrome, as well as headaches and balance have worsened. She has fallen 4 times in the past year. She has been having gait unsteadiness for several years, she would be standing then have to "do a little dance" to catch her balance. 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 Sometimes her left leg gets numb that she cannot stand on it. When she drives, her toes go numb. She sometimes feels like water is flowing down her legs. She sometimes has sharp pain in the right big toe, like she is being jabbed by something sharp every 2-3 minutes.   She reports some pain  in her jaw, as well as a sore spot on the left side of her throat. She has seen an allergist and ENT specialist and told there is nothing wrong. She has been diagnosed with fibromyalgia in the past, and states that she cannot touch herself without feeling soreness. She also has a burning pain on the left abdominal wall. She reports fainting spells when she first got married, none since 1975, but has been having  blackouts where she can't see things, different from the monocular vision loss on the left. She had a carotid ultrasound recently, results unavailable for review. She has intermittent high-pitched tinnitus. She has chronic neck and back pain. She has alternating diarrhea and constipation, Miralax daily is not helping. She had been having word-finding difficulties that resolved after she stopped Xanax use.   I personally reviewed head CT without contrast done 12/2014 for headaches, no acute changes.   PAST MEDICAL HISTORY: Past Medical History  Diagnosis Date  . Migraine without aura, without mention of intractable migraine without mention of status migrainosus   . History of diverticulitis of colon   . Pure hypercholesterolemia   . Irritable bowel syndrome   . Laryngeal nodule     Dr. Thomasena Edis  . Fibrocystic breast disease   . Hemorrhoids   . Anxiety   . GERD (gastroesophageal reflux disease)   . Chronic systolic CHF (congestive heart failure) (HCC)     a. EF 25-35%, LV dilation, significant MR, goal weight 150lbs;  b. 04/2014 Echo: EF 50-55%, Gr 1 DD.  Marland Kitchen Nonischemic cardiomyopathy ([place])     a. Recovered LV fxn - prev as low as 25%, now 50-55% by echo 04/2014;  b. 2012 s/p MDT [id number] [place] VR single lead ICD.  Marland Kitchen History of pneumonia 2012  . Mitral regurgitation   . Anxiety   . Bronchiectasis     a. possibly mild, treat URIs aggressively  . Essential hypertension   . Chest pain     a. prev neg MV.  . Lung disease 07/14/11  . Anemia   . Fibromyalgia   . Depression   . Disorder of vocal cords   . Chronic sinusitis   . Insomnia   . Osteoporosis 01/2013  . sigmoid diverticulitis with perforation, abscess and fistula 09/30/2013  . H/O multiple allergies 10/10/2013  . PONV (postoperative nausea and vomiting)   . Pneumonia 2011  . Automatic implantable cardioverter-defibrillator in situ 2012    a. MDT single lead ICD.  Marland Kitchen Osteoarthritis     knees, back, hands, low back,   . Cancer (HCC)      basal cell, arms & neck  . Sleep apnea 2010    borderline, inconclusive- /w Laurie   . Hemorrhoids   . Diarrhea 07/14/2008    Qualifier: Diagnosis of  By: Glori Bickers MD, Carmell Austria   . Allergy   . Pre-syncope     MEDICATIONS: Current Outpatient Prescriptions on File Prior to Visit  Medication Sig Dispense Refill  . acetaminophen (TYLENOL) 500 MG tablet Take 500 mg by mouth daily.     Marland Kitchen alendronate (FOSAMAX) 70 MG tablet Take 70 mg by mouth once a week. Patient takes every Thursday    . ALPRAZolam (XANAX) 0.25 MG tablet TAKE 1 TABLET TWICE A DAY AS NEEDED 60 tablet 0  . Ascorbic Acid (VITAMIN C) 1000 MG tablet Take 1,000 mg by mouth daily.    Marland Kitchen aspirin EC 81 MG tablet Take 81 mg by mouth daily.    Marland Kitchen  atorvastatin (LIPITOR) 40 MG tablet TAKE 1 TABLET (40 MG TOTAL) BY MOUTH EVERY OTHER DAY. (Patient taking differently: TAKE 1 TABLET (40 MG TOTAL) BY MOUTH EVERY TWO DAYS.) 45 tablet 3  . Calcium Carbonate-Vitamin D (CALCIUM + D PO) Take 1 tablet by mouth daily.    . carvedilol (COREG) 6.25 MG tablet Take 1 tablet (6.25 mg total) by mouth 2 (two) times daily with a meal. (Patient taking differently: 6.25 mg. Take 1/2 tablet twice daily) 180 tablet 4  . chlorpheniramine-HYDROcodone (TUSSIONEX PENNKINETIC ER) 10-8 MG/5ML SUER Take 5 mLs by mouth at bedtime as needed for cough. 140 mL 0  . Cholecalciferol (VITAMIN D3) 50000 units TABS Take 1 tablet by mouth once a week. 12 tablet 1  . ezetimibe (ZETIA) 10 MG tablet Take 1 tablet (10 mg total) by mouth daily. 30 tablet 3  . Ferrous Sulfate ([employer] SLOW RELEASE IRON) 143 (45 Fe) MG TBCR Take 1 tablet by mouth daily. 90 tablet 3  . ketoconazole (NIZORAL) 2 % cream Apply 1 application topically daily. 15 g 0  . mometasone (ELOCON) 0.1 % lotion Apply 1 application topically as needed (for eczema). Reported on 05/05/2015    . omeprazole (PRILOSEC) 40 MG capsule TAKE ONE CAPSULE BY MOUTH TWICE A DAY BEFORE MEALS 60 capsule 11  . polyethylene glycol  (MIRALAX / GLYCOLAX) packet Take 17 g by mouth daily.     . Probiotic Product (PROBIOTIC DAILY) CAPS Take 1 capsule by mouth daily.    . Pyridoxine HCl (VITAMIN B6 PO) Take 1 capsule by mouth daily.    Marland Kitchen spironolactone (ALDACTONE) 12.5 mg TABS tablet Take 12.5 mg by mouth daily.    Marland Kitchen triamcinolone (NASACORT) 55 MCG/ACT AERO nasal inhaler Place 2 sprays into the nose 2 (two) times daily.    . vitamin E 100 UNIT capsule Take 100 Units by mouth daily.    Marland Kitchen zolpidem (AMBIEN) 5 MG tablet TAKE 1/2 TO 1 TABLET AT BEDTIME AS NEEDED 30 tablet 0  . [DISCONTINUED] bisoprolol (ZEBETA) 5 MG tablet Take 5 mg by mouth daily.     No current facility-administered medications on file prior to visit.    ALLERGIES: Allergies  Allergen Reactions  . Antihistamines, Chlorpheniramine-Type Other (See Comments)    Makes her eyes look like she sees strobe lights  . Codeine Other (See Comments)    Confusion and dizziness  . Cyclobenzaprine Other (See Comments)    Adverse reaction - back and throat pain, dizziness, exhaustion  . Cymbalta [Duloxetine Hcl] Other (See Comments)    "body spasms and made me feel weird in my head"   . Influenza Vac Split [Flu Virus Vaccine] Other (See Comments)    "allergic to concentrated eggs that are in vaccine"  . Penicillins Other (See Comments)    REACTION: "told 50 years ago I couldn't take it; might be immune to it", able to take amoxicillin  . Sulfasalazine Other (See Comments)    "makes my whole body smell like sulfa & makes me sick just smelling it"  . Atorvastatin Other (See Comments)    Severe muscle cramps to generic atorvastatin.  Able to take brand lipitor.  . Chlorhexidine Other (See Comments)    blisters  . Dilaudid [Hydromorphone Hcl] Other (See Comments)    Feels like she is burning internally   . Latex Nausea Only, Rash and Other (See Comments)    Pain and infection  . Morphine And Related Other (See Comments)    Internally feels like she  is on fire   .  Pravastatin Other (See Comments)    myalgias  . Prevacid [Lansoprazole] Other (See Comments)    Worsened GI side effects  . Red Dye Nausea Only and Swelling  . Rosuvastatin Other (See Comments)    Was not effective controlling lipids  . Simvastatin Other (See Comments)    Leg cramps  . Other     NOTE: pt is able to take cephalosporins without reaction  . Tape Other (See Comments)    All adhesives  - Blisters, itching and burning   . Metaxalone Other (See Comments)    REACTION: unknown    FAMILY HISTORY: Family History  Problem Relation Age of Onset  . Lung cancer Father     + smoker  . Dementia Mother   . Osteoporosis Mother     Lumbar spine  . Stroke Mother     x 5 @ 67 YOA  . Arthritis Mother     hands  . Emphysema Mother   . Alcohol abuse Paternal Grandfather   . Hypertension Maternal Grandfather     ?  . Stroke Paternal Grandfather     ?  Marland Kitchen Heart disease Paternal Grandmother   . Heart disease Paternal Grandfather   . Colon polyps Father   . Colon cancer Neg Hx   . Esophageal cancer Neg Hx   . Gallbladder disease Neg Hx     SOCIAL HISTORY: Social History   Social History  . Marital Status: Married    Spouse Name: N/A  . Number of Children: 1  . Years of Education: N/A   Occupational History  . 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 [employer]    Social History Main Topics  . Smoking status: Never Smoker   . Smokeless tobacco: Never Used     Comment: Lived with smokers x 23 yrs, smoked herself "for a week"  . Alcohol Use: No  . Drug Use: No  . Sexual Activity: Not Currently   Other Topics Concern  . Not on file   Social History Narrative   1 adopted child      Lives with husband             REVIEW OF SYSTEMS: Constitutional: No fevers, chills, or sweats, + generalized fatigue, no change in appetite Eyes: No visual changes, double vision, eye pain Ear, nose and throat: No hearing loss, ear pain, nasal congestion,  +sore throat Cardiovascular: No chest pain, palpitations Respiratory:  No shortness of breath at rest or with exertion, wheezes GastrointestinaI: No nausea, vomiting, diarrhea, abdominal pain, fecal incontinence Genitourinary:  No dysuria, urinary retention or frequency Musculoskeletal:  + neck pain, back pain Integumentary: No rash, pruritus, skin lesions Neurological: as above Psychiatric: No depression, insomnia, anxiety Endocrine: No palpitations, fatigue, diaphoresis, mood swings, change in appetite, change in weight, increased thirst Hematologic/Lymphatic:  No anemia, purpura, petechiae. Allergic/Immunologic: no itchy/runny eyes, nasal congestion, recent allergic reactions, rashes  PHYSICAL EXAM: Filed Vitals:   06/16/15 1123  BP: 104/70  Pulse: 85  Resp: 18   General: No acute distress Head:  Normocephalic/atraumatic Neck: supple, no paraspinal tenderness, full range of motion Heart:  Regular rate and rhythm Lungs:  Clear to auscultation bilaterally Back: No paraspinal tenderness Skin/Extremities: No rash, no edema Neurological Exam: alert and oriented to person, place, and time. No aphasia or dysarthria. Fund of knowledge is appropriate.  Recent and remote memory are intact.  Attention and concentration are normal.  Able to name objects and repeat phrases. Cranial nerves: Pupils equal, round, reactive to light.  Extraocular movements intact with no nystagmus. Visual fields full. Facial sensation intact. No facial asymmetry. Tongue, uvula, palate midline.  Motor: Bulk and tone normal, muscle strength 5/5 throughout with no pronator drift.  Sensation to light touch intact.  No extinction to double simultaneous stimulation.  Deep tendon reflexes 2+ throughout, toes downgoing.  Finger to nose testing intact.  Gait narrow-based, slightly unsteady, no ataxia.  Romberg negative.  IMPRESSION: This is a 70 yo RH woman with a history of hyperlipidemia, cardiomyopathy s/p pacemaker  placement, diverticulosis/diverticulitis, migraines, who presented for worsening headaches and dizziness. 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 Neuropathy labs overall unremarkable, her ESR was elevated, however CRP was normal. Dizziness has improved with adjustment in carvedilol. She reports headaches are better, but had to stop Cymbalta due to side effects. We discussed other options for headache prophylaxis, including Gabapentin, Topamax, tricyclics. She is feeling unwell at this time and would like to get medications out of her system first before trying a new medication. She will call our office once she is feeling better, and will start low dose gabapentin 126m qhs x 2 weeks, then increase to BID. Side effects were discussed. We also discussed that although there are migrainous features to her headaches, there is likely a component of medication overuse headaches as well with daily intake of Tylenol, as well as chronic pain syndrome with various aches and pains throughout her body. She was advised to minimize Tylenol intake as much as she can. She will follow-up in 2 months and knows to call for any changes.   Thank you for allowing me to participate in her care.  Please do not hesitate to call for any questions or concerns.  The duration of this appointment visit was 24 minutes of face-to-face time with the patient.  Greater than 50% of this time was spent in counseling, explanation of diagnosis, planning of further management, and coordination of care.   KEllouise Newer M.D.   CC: Dr. GDanise Mina

## 2015-06-21 ENCOUNTER — Encounter: Payer: Self-pay | Admitting: Family Medicine

## 2015-06-21 ENCOUNTER — Ambulatory Visit (INDEPENDENT_AMBULATORY_CARE_PROVIDER_SITE_OTHER): Payer: Medicare Other | Admitting: Family Medicine

## 2015-06-21 VITALS — BP 124/80 | HR 86 | Temp 97.9°F | Wt 177.5 lb

## 2015-06-21 DIAGNOSIS — R5381 Other malaise: Secondary | ICD-10-CM

## 2015-06-21 DIAGNOSIS — F32A Depression, unspecified: Secondary | ICD-10-CM

## 2015-06-21 DIAGNOSIS — F329 Major depressive disorder, single episode, unspecified: Secondary | ICD-10-CM | POA: Diagnosis not present

## 2015-06-21 DIAGNOSIS — R5382 Chronic fatigue, unspecified: Secondary | ICD-10-CM

## 2015-06-21 DIAGNOSIS — R197 Diarrhea, unspecified: Secondary | ICD-10-CM

## 2015-06-21 DIAGNOSIS — R490 Dysphonia: Secondary | ICD-10-CM | POA: Insufficient documentation

## 2015-06-21 DIAGNOSIS — N183 Chronic kidney disease, stage 3 unspecified: Secondary | ICD-10-CM

## 2015-06-21 LAB — BASIC METABOLIC PANEL
BUN: 22 mg/dL (ref 6–23)
CO2: 28 mEq/L (ref 19–32)
Calcium: 9.7 mg/dL (ref 8.4–10.5)
Chloride: 103 mEq/L (ref 96–112)
Creatinine, Ser: 1.21 mg/dL — ABNORMAL HIGH (ref 0.40–1.20)
GFR: 46.77 mL/min — ABNORMAL LOW (ref >60.00)
Glucose, Bld: 88 mg/dL (ref 70–99)
Potassium: 4.3 mEq/L (ref 3.5–5.1)
Sodium: 139 mEq/L (ref 135–145)

## 2015-06-21 LAB — CBC WITH DIFFERENTIAL/PLATELET
Basophils Absolute: 0.1 10*3/uL (ref 0.0–0.1)
Basophils Relative: 0.4 % (ref 0.0–3.0)
Eosinophils Absolute: 0.2 10*3/uL (ref 0.0–0.7)
Eosinophils Relative: 1.4 % (ref 0.0–5.0)
HCT: 38.9 % (ref 36.0–46.0)
Hemoglobin: 12.8 g/dL (ref 12.0–15.0)
Lymphocytes Relative: 22.1 % (ref 12.0–46.0)
Lymphs Abs: 2.9 10*3/uL (ref 0.7–4.0)
MCHC: 32.8 g/dL (ref 30.0–36.0)
MCV: 91.2 fl (ref 78.0–100.0)
Monocytes Absolute: 1.2 10*3/uL — ABNORMAL HIGH (ref 0.1–1.0)
Monocytes Relative: 9.2 % (ref 3.0–12.0)
Neutro Abs: 8.8 10*3/uL — ABNORMAL HIGH (ref 1.4–7.7)
Neutrophils Relative %: 66.9 % (ref 43.0–77.0)
Platelets: 238 10*3/uL (ref 150.0–400.0)
RBC: 4.26 Mil/uL (ref 3.87–5.11)
RDW: 13.1 % (ref 11.5–15.5)
WBC: 13.2 10*3/uL — ABNORMAL HIGH (ref 4.0–10.5)

## 2015-06-21 MED ORDER — NYSTATIN 100000 UNIT/ML MT SUSP: 5.0000 mL | Freq: Three times a day (TID) | OROMUCOSAL | Status: DC | PRN

## 2015-06-21 MED ORDER — SERTRALINE HCL 25 MG PO TABS: 25.0000 mg | ORAL_TABLET | Freq: Every day | ORAL | Status: DC

## 2015-06-21 NOTE — Assessment & Plan Note (Signed)
Retrial sertraline 25mg  daily. Pt has tolerated well in the past.

## 2015-06-21 NOTE — Assessment & Plan Note (Signed)
With some LLQ discomfort and BRBPR presumed from hemorrhoid. With h/o complicated diverticulitis, check CBC, BMP today. Monitor for improvement.

## 2015-06-21 NOTE — Patient Instructions (Addendum)
Let's restart sertraline 25mg  1 tablet daily every day. This will take 3-4 weeks to take effect.  Try nystatin swish and swallow.  Labs today.

## 2015-06-21 NOTE — Progress Notes (Signed)
Pre visit review using our clinic review tool, if applicable. No additional management support is needed unless otherwise documented below in the visit note. 

## 2015-06-21 NOTE — Progress Notes (Addendum)
BP 124/80 mmHg  Pulse 86  Temp(Src) 97.9 F (36.6 C) (Oral)  Wt 177 lb 8 oz (80.513 kg)  SpO2 95%   CC: f/u visit  Subjective:    Patient ID: Ashley [name], female    DOB: 06-Apr-1946, 70 y.o.   MRN: [id number]  HPI: Ashley [name] is a 70 y.o. female presenting on 06/21/2015 for Follow-up   See prior note for details - seen 06/10/2015 with concern for hypersensitivity reaction to pneumococcal vaccine (Administered 06/08/2015). Treated with tussionex pennkinetic, prednisone course, zpack.   Last week with very sore throat and white spots on inner cheeks. Spitting up green mucous. Cough has improved. Persistent hoarseness and malaise and fatigue. Also with some chest discomfort and LLQ discomfort. Some looser stools recently as well as limited BRBPR which pt attributes to hemorrhoid.  Relevant past medical, surgical, family and social history reviewed and updated as indicated. Interim medical history since our last visit reviewed. Allergies and medications reviewed and updated. Current Outpatient Prescriptions on File Prior to Visit  Medication Sig  . acetaminophen (TYLENOL) 500 MG tablet Take 500 mg by mouth daily.   Marland Kitchen alendronate (FOSAMAX) 70 MG tablet Take 70 mg by mouth once a week. Patient takes every Thursday  . ALPRAZolam (XANAX) 0.25 MG tablet TAKE 1 TABLET TWICE A DAY AS NEEDED  . Ascorbic Acid (VITAMIN C) 1000 MG tablet Take 1,000 mg by mouth daily.  Marland Kitchen aspirin EC 81 MG tablet Take 81 mg by mouth daily.  Marland Kitchen atorvastatin (LIPITOR) 40 MG tablet TAKE 1 TABLET (40 MG TOTAL) BY MOUTH EVERY OTHER DAY. (Patient taking differently: TAKE 1 TABLET (40 MG TOTAL) BY MOUTH EVERY TWO DAYS.)  . Calcium Carbonate-Vitamin D (CALCIUM + D PO) Take 1 tablet by mouth daily.  . carvedilol (COREG) 6.25 MG tablet Take 1 tablet (6.25 mg total) by mouth 2 (two) times daily with a meal. (Patient taking differently: 6.25 mg. Take 1/2 tablet twice daily)  . chlorpheniramine-HYDROcodone (TUSSIONEX  PENNKINETIC ER) 10-8 MG/5ML SUER Take 5 mLs by mouth at bedtime as needed for cough.  . Cholecalciferol (VITAMIN D3) 50000 units TABS Take 1 tablet by mouth once a week.  . ezetimibe (ZETIA) 10 MG tablet Take 1 tablet (10 mg total) by mouth daily.  . Ferrous Sulfate ([employer]S SLOW RELEASE IRON) 143 (45 Fe) MG TBCR Take 1 tablet by mouth daily.  Marland Kitchen ketoconazole (NIZORAL) 2 % cream Apply 1 application topically daily.  . mometasone (ELOCON) 0.1 % lotion Apply 1 application topically as needed (for eczema). Reported on 05/05/2015  . omeprazole (PRILOSEC) 40 MG capsule TAKE ONE CAPSULE BY MOUTH TWICE A DAY BEFORE MEALS  . polyethylene glycol (MIRALAX / GLYCOLAX) packet Take 17 g by mouth daily.   . Probiotic Product (PROBIOTIC DAILY) CAPS Take 1 capsule by mouth daily.  . Pyridoxine HCl (VITAMIN B6 PO) Take 1 capsule by mouth daily.  Marland Kitchen spironolactone (ALDACTONE) 12.5 mg TABS tablet Take 12.5 mg by mouth daily.  Marland Kitchen triamcinolone (NASACORT) 55 MCG/ACT AERO nasal inhaler Place 2 sprays into the nose 2 (two) times daily.  . vitamin E 100 UNIT capsule Take 100 Units by mouth daily.  Marland Kitchen zolpidem (AMBIEN) 5 MG tablet TAKE 1/2 TO 1 TABLET AT BEDTIME AS NEEDED  . [DISCONTINUED] bisoprolol (ZEBETA) 5 MG tablet Take 5 mg by mouth daily.   No current facility-administered medications on file prior to visit.    Review of Systems Per HPI unless specifically indicated in ROS section  Objective:    BP 124/80 mmHg  Pulse 86  Temp(Src) 97.9 F (36.6 C) (Oral)  Wt 177 lb 8 oz (80.513 kg)  SpO2 95%  Wt Readings from Last 3 Encounters:  06/21/15 177 lb 8 oz (80.513 kg)  06/16/15 177 lb (80.287 kg)  06/10/15 175 lb (79.379 kg)    Physical Exam  Constitutional: She appears well-developed and well-nourished. No distress.  HENT:  Head: Normocephalic and atraumatic.  Right Ear: Hearing, tympanic membrane, external ear and ear canal normal.  Left Ear: Hearing, tympanic membrane, external ear and ear canal  normal.  Nose: No mucosal edema or rhinorrhea. Right sinus exhibits no maxillary sinus tenderness and no frontal sinus tenderness. Left sinus exhibits no maxillary sinus tenderness and no frontal sinus tenderness.  Mouth/Throat: Uvula is midline and mucous membranes are normal. Posterior oropharyngeal erythema (mild) present. No oropharyngeal exudate, posterior oropharyngeal edema or tonsillar abscesses.  No white spots on tongue or significant erythema of pharynx. Slight lacy white pattern on inner cheeks Hoarse voice present  Eyes: Conjunctivae and EOM are normal. Pupils are equal, round, and reactive to light. No scleral icterus.  Neck: Normal range of motion. Neck supple.  Cardiovascular: Normal rate, regular rhythm, normal heart sounds and intact distal pulses.   No murmur heard. Pulmonary/Chest: Effort normal and breath sounds normal. No respiratory distress. She has no wheezes. She has no rales.  Abdominal: Soft. Bowel sounds are normal. She exhibits no distension and no mass. There is no hepatosplenomegaly. There is tenderness (mild-mod) in the right upper quadrant and left lower quadrant. There is no rigidity, no rebound, no guarding, no CVA tenderness and negative Murphy's sign.  Lymphadenopathy:    She has no cervical adenopathy.  Skin: Skin is warm and dry. No rash noted.  Nursing note and vitals reviewed.  Results for orders placed or performed in visit on 06/21/15  CBC with Differential/Platelet  Result Value Ref Range   WBC 13.2 (H) 4.0 - 10.5 K/uL   RBC 4.26 3.87 - 5.11 Mil/uL   Hemoglobin 12.8 12.0 - 15.0 g/dL   HCT 38.9 36.0 - 46.0 %   MCV 91.2 78.0 - 100.0 fl   MCHC 32.8 30.0 - 36.0 g/dL   RDW 13.1 11.5 - 15.5 %   Platelets 238.0 150.0 - 400.0 K/uL   Neutrophils Relative % 66.9 43.0 - 77.0 %   Lymphocytes Relative 22.1 12.0 - 46.0 %   Monocytes Relative 9.2 3.0 - 12.0 %   Eosinophils Relative 1.4 0.0 - 5.0 %   Basophils Relative 0.4 0.0 - 3.0 %   Neutro Abs 8.8 (H)  1.4 - 7.7 K/uL   Lymphs Abs 2.9 0.7 - 4.0 K/uL   Monocytes Absolute 1.2 (H) 0.1 - 1.0 K/uL   Eosinophils Absolute 0.2 0.0 - 0.7 K/uL   Basophils Absolute 0.1 0.0 - 0.1 K/uL  Basic metabolic panel  Result Value Ref Range   Sodium 139 135 - 145 mEq/L   Potassium 4.3 3.5 - 5.1 mEq/L   Chloride 103 96 - 112 mEq/L   CO2 28 19 - 32 mEq/L   Glucose, Bld 88 70 - 99 mg/dL   BUN 22 6 - 23 mg/dL   Creatinine, Ser 1.21 (H) 0.40 - 1.20 mg/dL   Calcium 9.7 8.4 - 10.5 mg/dL   GFR 46.77 (L) >60.00 mL/min   Lab Results  Component Value Date   HGBA1C 5.8 06/01/2015      Assessment & Plan:   Problem List Items Addressed  This Visit    Hoarseness - Primary    Initially with severe ST - that is slowly resolving. Hoarseness which pt endorses started after pneumococcal vaccine, associated with slight white cheek discoloration - after prednisone and abx courses. Not quite consistent with thrush but will cover with nystatin swish/swallow. If persistent hoarseness would consider referral back to ENT      Diarrhea    With some LLQ discomfort and BRBPR presumed from hemorrhoid. With h/o complicated diverticulitis, check CBC, BMP today. Monitor for improvement.      Depression    Retrial sertraline 25mg  daily. Pt has tolerated well in the past.      Relevant Medications   sertraline (ZOLOFT) 25 MG tablet   CKD (chronic kidney disease) stage 3, GFR 30-59 ml/min   Relevant Orders   CBC with Differential/Platelet (Completed)   Basic metabolic panel (Completed)   Chronic fatigue and malaise    Pt endorses sxs started after extensive surgical procedures 2015. Has not felt well since. Will start sertraline 25mg  daily hopeful to help energy level as well.           Follow up plan: Return if symptoms worsen or fail to improve.

## 2015-06-21 NOTE — Assessment & Plan Note (Addendum)
Initially with severe ST - that is slowly resolving. Hoarseness which pt endorses started after pneumococcal vaccine, associated with slight white cheek discoloration - after prednisone and abx courses. Not quite consistent with thrush but will cover with nystatin swish/swallow. If persistent hoarseness would consider referral back to ENT

## 2015-06-21 NOTE — Assessment & Plan Note (Signed)
Pt endorses sxs started after extensive surgical procedures 2015. Has not felt well since. Will start sertraline 25mg  daily hopeful to help energy level as well.

## 2015-06-25 DIAGNOSIS — L570 Actinic keratosis: Secondary | ICD-10-CM | POA: Diagnosis not present

## 2015-06-28 DIAGNOSIS — H6120 Impacted cerumen, unspecified ear: Secondary | ICD-10-CM | POA: Diagnosis not present

## 2015-06-28 DIAGNOSIS — H9209 Otalgia, unspecified ear: Secondary | ICD-10-CM | POA: Diagnosis not present

## 2015-06-28 DIAGNOSIS — M542 Cervicalgia: Secondary | ICD-10-CM | POA: Diagnosis not present

## 2015-06-28 DIAGNOSIS — K219 Gastro-esophageal reflux disease without esophagitis: Secondary | ICD-10-CM | POA: Diagnosis not present

## 2015-06-28 DIAGNOSIS — H6122 Impacted cerumen, left ear: Secondary | ICD-10-CM | POA: Diagnosis not present

## 2015-06-28 DIAGNOSIS — M26609 Unspecified temporomandibular joint disorder, unspecified side: Secondary | ICD-10-CM | POA: Diagnosis not present

## 2015-06-30 ENCOUNTER — Other Ambulatory Visit: Payer: Self-pay | Admitting: *Deleted

## 2015-06-30 LAB — COLOGUARD

## 2015-07-05 ENCOUNTER — Telehealth: Payer: Self-pay

## 2015-07-05 NOTE — Telephone Encounter (Signed)
Left patient message to return call when possible.

## 2015-07-05 NOTE — Telephone Encounter (Signed)
Pt states her chest "aches, her jaw aches, and her upper arm aches. States her chest feels "heavy, not pain, just like a bad bruise" please call.

## 2015-07-06 ENCOUNTER — Telehealth: Payer: Self-pay | Admitting: *Deleted

## 2015-07-06 NOTE — Telephone Encounter (Signed)
Patient scheduled appointment for tomorrow 07/07/2015 with Christell Faith PA.

## 2015-07-06 NOTE — Telephone Encounter (Signed)
Patient states that she is not currently have any chest discomfort but has had some recently that concerns her. She reported that she was recently seen at her primary doctors office and had a great report. She states that she did have a reaction to a pneumonia vaccine which has caused her to have "coughing spells". She also reported that she had some tenderness around her pacemaker site. She is concerned about her heart failure returning and the medications that she is currently taking. Sending message to get her an appointment. Let her know that if chest pain returns or if she has trouble breathing to go to the nearest emergency room and she verbalized understanding.

## 2015-07-07 ENCOUNTER — Ambulatory Visit (INDEPENDENT_AMBULATORY_CARE_PROVIDER_SITE_OTHER): Payer: Medicare Other | Admitting: Physician Assistant

## 2015-07-07 ENCOUNTER — Encounter: Payer: Self-pay | Admitting: Physician Assistant

## 2015-07-07 ENCOUNTER — Other Ambulatory Visit
Admission: RE | Admit: 2015-07-07 | Discharge: 2015-07-07 | Disposition: A | Discharge status: Home / Self Care | Payer: Medicare Other | Source: Ambulatory Visit | Attending: Ophthalmology | Admitting: Ophthalmology

## 2015-07-07 VITALS — BP 108/70 | HR 89 | Ht 65.5 in | Wt 174.8 lb

## 2015-07-07 DIAGNOSIS — I447 Left bundle-branch block, unspecified: Secondary | ICD-10-CM

## 2015-07-07 DIAGNOSIS — R0789 Other chest pain: Secondary | ICD-10-CM | POA: Diagnosis not present

## 2015-07-07 DIAGNOSIS — I428 Other cardiomyopathies: Secondary | ICD-10-CM

## 2015-07-07 DIAGNOSIS — H5712 Ocular pain, left eye: Secondary | ICD-10-CM | POA: Diagnosis not present

## 2015-07-07 DIAGNOSIS — I429 Cardiomyopathy, unspecified: Secondary | ICD-10-CM

## 2015-07-07 DIAGNOSIS — R079 Chest pain, unspecified: Secondary | ICD-10-CM

## 2015-07-07 DIAGNOSIS — I10 Essential (primary) hypertension: Secondary | ICD-10-CM

## 2015-07-07 DIAGNOSIS — H04123 Dry eye syndrome of bilateral lacrimal glands: Secondary | ICD-10-CM | POA: Diagnosis not present

## 2015-07-07 DIAGNOSIS — R51 Headache: Secondary | ICD-10-CM | POA: Diagnosis not present

## 2015-07-07 DIAGNOSIS — R6889 Other general symptoms and signs: Secondary | ICD-10-CM

## 2015-07-07 LAB — SEDIMENTATION RATE: Sed Rate: 69 mm/hr — ABNORMAL HIGH (ref 0–30)

## 2015-07-07 NOTE — Patient Instructions (Addendum)
Medication Instructions:  Your physician recommends that you continue on your current medications as directed. Please refer to the Current Medication list given to you today.   Labwork: None  Testing/Procedures: Your physician has requested that you have a lexiscan myoview. For further information please visit [email or web address]. Please follow instruction sheet, as given.  [place]  Your caregiver has ordered a Stress Test with nuclear imaging. The purpose of this test is to evaluate the blood supply to your heart muscle. This procedure is referred to as a "Non-Invasive Stress Test." This is because other than having an IV started in your vein, nothing is inserted or "invades" your body. Cardiac stress tests are done to find areas of poor blood flow to the heart by determining the extent of coronary artery disease (CAD). Some patients exercise on a treadmill, which naturally increases the blood flow to your heart, while others who are  unable to walk on a treadmill due to physical limitations have a pharmacologic/chemical stress agent called Lexiscan . This medicine will mimic walking on a treadmill by temporarily increasing your coronary blood flow.   Please note: these test may take anywhere between 2-4 hours to complete  PLEASE REPORT TO [place] AT THE FIRST DESK WILL DIRECT YOU WHERE TO GO  Date of Procedure:_____Friday, Feb 24____  Arrival Time for Procedure:____7:15am____________________  Instructions regarding medication:   __xx__:  Hold betablocker(s) night before procedure and morning of procedure: carvedilol     PLEASE NOTIFY THE OFFICE AT LEAST 24 HOURS IN ADVANCE IF YOU ARE UNABLE TO KEEP YOUR APPOINTMENT.  [phone] AND  PLEASE NOTIFY NUCLEAR MEDICINE AT [facility] AT LEAST 24 HOURS IN ADVANCE IF YOU ARE UNABLE TO KEEP YOUR APPOINTMENT. [phone]  How to prepare for your Myoview test:   Do not eat or drink after midnight  No  caffeine for 24 hours prior to test  No smoking 24 hours prior to test.  Your medication may be taken with water.  If your doctor stopped a medication because of this test, do not take that medication.  Ladies, please do not wear dresses.  Skirts or pants are appropriate. Please wear a short sleeve shirt.  No perfume, cologne or lotion.  Wear comfortable walking shoes. No heels!          Your physician has requested that you have an echocardiogram. Echocardiography is a painless test that uses sound waves to create images of your heart. It provides your doctor with information about the size and shape of your heart and how well your heart's chambers and valves are working. This procedure takes approximately one hour. There are no restrictions for this procedure.    Follow-Up: Your physician recommends that you schedule a follow-up appointment in: one month with Dr. [name].   Any Other Special Instructions Will Be Listed Below (If Applicable).     If you need a refill on your cardiac medications before your next appointment, please call your pharmacy.  Cardiac Nuclear Scanning A cardiac nuclear scan is used to check your heart for problems, such as the following:  A portion of the heart is not getting enough blood.  Part of the heart muscle has died, which happens with a heart attack.  The heart wall is not working normally.  In this test, a radioactive dye (tracer) is injected into your bloodstream. After the tracer has traveled to your heart, a scanning device is used to measure how much of the tracer is  absorbed by or distributed to various areas of your heart. LET [facility] CARE PROVIDER KNOW ABOUT:  Any allergies you have.  All medicines you are taking, including vitamins, herbs, eye drops, creams, and over-the-counter medicines.  Previous problems you or members of your family have had with the use of anesthetics.  Any blood disorders you have.  Previous  surgeries you have had.  Medical conditions you have.  RISKS AND COMPLICATIONS Generally, this is a safe procedure. However, as with any procedure, problems can occur. Possible problems include:   Serious chest pain.  Rapid heartbeat.  Sensation of warmth in your chest. This usually passes quickly. BEFORE THE PROCEDURE Ask your health care provider about changing or stopping your regular medicines. PROCEDURE This procedure is usually done at a hospital and takes 2-4 hours.  An IV tube is inserted into one of your veins.  Your health care provider will inject a small amount of radioactive tracer through the tube.  You will then wait for 20-40 minutes while the tracer travels through your bloodstream.  You will lie down on an exam table so images of your heart can be taken. Images will be taken for about 15-20 minutes.  You will exercise on a treadmill or stationary bike. While you exercise, your heart activity will be monitored with an electrocardiogram (ECG), and your blood pressure will be checked.  If you are unable to exercise, you may be given a medicine to make your heart beat faster.  When blood flow to your heart has peaked, tracer will again be injected through the IV tube.  After 20-40 minutes, you will get back on the exam table and have more images taken of your heart.  When the procedure is over, your IV tube will be removed. AFTER THE PROCEDURE  You will likely be able to leave shortly after the test. Unless your health care provider tells you otherwise, you may return to your normal schedule, including diet, activities, and medicines.  Make sure you find out how and when you will get your test results.   This information is not intended to replace advice given to you by your health care provider. Make sure you discuss any questions you have with your health care provider.   Document Released: 05/26/2004 Document Revised: 05/06/2013 Document Reviewed:  04/09/2013 Elsevier Interactive Patient Education [employer]. Echocardiogram An echocardiogram, or echocardiography, uses sound waves (ultrasound) to produce an image of your heart. The echocardiogram is simple, painless, obtained within a short period of time, and offers valuable information to your health care provider. The images from an echocardiogram can provide information such as:  Evidence of coronary artery disease (CAD).  Heart size.  Heart muscle function.  Heart valve function.  Aneurysm detection.  Evidence of a past heart attack.  Fluid buildup around the heart.  Heart muscle thickening.  Assess heart valve function. LET [facility] CARE PROVIDER KNOW ABOUT:  Any allergies you have.  All medicines you are taking, including vitamins, herbs, eye drops, creams, and over-the-counter medicines.  Previous problems you or members of your family have had with the use of anesthetics.  Any blood disorders you have.  Previous surgeries you have had.  Medical conditions you have.  Possibility of pregnancy, if this applies. BEFORE THE PROCEDURE  No special preparation is needed. Eat and drink normally.  PROCEDURE   In order to produce an image of your heart, gel will be applied to your chest and a wand-like tool (transducer)  will be moved over your chest. The gel will help transmit the sound waves from the transducer. The sound waves will harmlessly bounce off your heart to allow the heart images to be captured in real-time motion. These images will then be recorded.  You may need an IV to receive a medicine that improves the quality of the pictures. AFTER THE PROCEDURE You may return to your normal schedule including diet, activities, and medicines, unless your health care provider tells you otherwise.   This information is not intended to replace advice given to you by your health care provider. Make sure you discuss any questions you have with your health  care provider.   Document Released: 04/28/2000 Document Revised: 05/22/2014 Document Reviewed: 01/06/2013 Elsevier Interactive Patient Education [employer].

## 2015-07-07 NOTE — Progress Notes (Signed)
Cardiology Office Note Date:  07/07/2015  Patient ID:  Ashley [name], Ashley [name] 02/12/1946, MRN [id number] PCP:  [name], MD  Cardiologist:  Dr. [name], MD    Chief Complaint: Chest pain with multiple somatic complaints  History of Present Illness: Ashley [name] is a 70 y.o. female with history of NICM by [facility] with prior EF of 25-35% now improved to 50-55% in 04/2014 s/p MDT ICD, fibromyalgia, IBS, chronic SOB with previously abnormal PFTs, chronic cough felt to be secondary to GERD, chronic mailaise and fatigue, and anxiety who presents for chest pain at her ICD site s/p receiving PNA vaccine on 06/08/15 along with other numerous non-cardiac complaints. Prior echo from 02/2011 shoed an EF of 25-30%, diffuse HK, GR1DD, mild AI, moderate to severe MR, LA moderately dilated, PASP not well estimated. She underwent Lexiscan Myoview 03/13/2011 that did not show any significant ischemia. There was significant GI uptake both a rest and stress. There was thinning of the myocardium in the mid to anterior wall and inferoapical wall likely 2/2 anttenuation artifact. Unable to determine if there was focal wall motion abnormalities 2/2 GI uptake. EF 54%. Overall, this was read as a low risk scan. Repeat echo 04/2011 showed EF 25/30%, diffuse HK, moderate MR, LA moderately dilated, RA mildly dilated. Echo 3/13 showed EF 30-35% with moderate glocal HK, GR1DD, mild MR, PASP normal. She underwent MDT ICD implantation on 07/16/2011 by Dr. Lovena Le. Echo 02/2013 with EF of 50-55%, no RWMA. Echo 04/2014 showed EF 50-55%, no RWMA, GR1DD. Most recent echo from 03/2015 showed an EF of 50-55%, no RWMA, GR1DD. She has previously had issues with dehydration with overuse of torsemide. In gernal, at prior office visits she has reported numerous issues, most of which have been felt to be noncardiac. She comes in today with numerous complaints, again most of which are not cardiac including arthritic hand pain,  lower back pain, and leg pain. She states she has had chest pain at her ICD site since receiving her PNA vaccine on 06/08/2015. She does not have chest pain anywhere else. She has not had any erythema, swelling, drainage, fevers, or chills at the device site. Her breathing and activity level has been at her baseline. She denies any orthopnea, lower extremity edema or early satiety.    Past Medical History  Diagnosis Date  . Migraine without aura, without mention of intractable migraine without mention of status migrainosus   . History of diverticulitis of colon   . Pure hypercholesterolemia   . Irritable bowel syndrome   . Laryngeal nodule     Dr. Thomasena Edis  . Fibrocystic breast disease   . Hemorrhoids   . Anxiety   . GERD (gastroesophageal reflux disease)   . Chronic systolic CHF (congestive heart failure) (HCC)     a. EF 25-35%, LV dilation, significant MR, goal weight 150lbs;  b. 04/2014 Echo: EF 50-55%, Gr 1 DD.  Marland Kitchen Nonischemic cardiomyopathy ([place])     a. Recovered LV fxn - prev as low as 25%, now 50-55% by echo 04/2014;  b. 2012 s/p MDT [id number] Secura VR single lead ICD.  Marland Kitchen History of pneumonia 2012  . Mitral regurgitation   . Anxiety   . Bronchiectasis     a. possibly mild, treat URIs aggressively  . Essential hypertension   . Chest pain     a. prev neg MV.  . Lung disease 07/14/11  . Anemia   . Fibromyalgia   . Depression   . Disorder  of vocal cords   . Chronic sinusitis   . Insomnia   . Osteoporosis 01/2013  . sigmoid diverticulitis with perforation, abscess and fistula 09/30/2013  . H/O multiple allergies 10/10/2013  . PONV (postoperative nausea and vomiting)   . Pneumonia 2011  . Automatic implantable cardioverter-defibrillator in situ 2012    a. MDT single lead ICD.  Marland Kitchen Osteoarthritis     knees, back, hands, low back,   . Cancer (HCC)     basal cell, arms & neck  . Sleep apnea 2010    borderline, inconclusive- /w [place]   . Hemorrhoids   . Diarrhea 07/14/2008     Qualifier: Diagnosis of  By: Glori Bickers MD, Carmell Austria   . Allergy   . Pre-syncope     Past Surgical History  Procedure Laterality Date  . Appendectomy    . Mandible surgery      "got about 6 pins in the bottom of my jaw"  . Knee arthroscopy w/ orif      Left; "meniscus tear"  . Osteotomy      Left foot  . 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 Dexa  03/28/99    osteopenia L/S  . Colonoscopy  1998    nml (sigmoid-Gilbert-nol)  . Head mri, head ct  10/1998    ? H/A focus ([place])  . Emg/mcv  10/16/01    + mild carpal tunnel  . Dexa  03/12/03    2.2 Spine 0.1 Hip  Osteopenia  . Mri right hip  12/12/01    Tendonitis Gluteus Medius to Gtr Trochanter Neg Bursitis (Dr. Eulas Post)  . L/s films  11/21/01    nml; Right hip nml  . Mri u/s  11/21/01    min. DDD L/3-4  . Colonoscopy  01/04/04    polyps, diverticulosis/severe  . Dexa  11/14/05    1.9 Spine 0.3 Hip  slight improvement  . Flex laryngoscopy  06/11/06    ([place]) nml  . Dexa  11/22/06    improved osteo and fem neck  . [place] bunionectomy  11/04/08    Right Great Toe (Dr. Beola Cord)  . Breast mass excision  01/2010    Left-fibrocystic change w/intraductal papilloma, no malignancy  . Ct chest  12/2010    possible bronchiectasis lower lobes, 02/2011 - enlarged bilateral effusions, bibasilar atx  . Stress myoview  02/2011    no significant ischemia  . Ct chest  04/2011    no PE.  abnormal R hilar and mediastinal adenopathy --> on rpt 05/2011, resolved  . US echocardiography  04/2011    mildly dilated LV, EF 25-30%, hypokinesis, restrictive physiology, mod MR, no AS  . Icd placement  07/14/11    w/pacemaker  . Tonsillectomy and adenoidectomy    . Breast biopsy      "I've had 2-3 bx; all benign"  . Augmentation mammaplasty    . 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 Abdominal hysterectomy  1987    w/BSO  . Lasik      left eye  . Knee surgery      left  . Mandible surgery    . Sinus  surgery      x3 with balloon   . Dexa  01/2013    T score -2.6 at spine  . Partial colectomy N/A 10/02/2013    PARTIAL COLECTOMY;  Surgeon: Imogene Burn. Tsuei, MD  .  Colostomy N/A 10/02/2013    DESCENDING COLOSTOMY;  Surgeon: Imogene Burn. Georgette Dover, MD  . Colostomy reversal N/A 02/10/2014    [name], MD  . Implantable cardioverter defibrillator implant N/A 07/14/2011    IMPLANTABLE CARDIOVERTER DEFIBRILLATOR IMPLANT;  Surgeon: Evans Lance, MD;  Location: [facility] CATH LAB;  Service: Cardiovascular;  Laterality: N/A;  . Esophagogastroduodenoscopy  09/2014    small HH, irregular Z line, fundic gland polyps with mild gastritis ([place])  . Ncs  11/2014    L ulnar neuropathy, mild B median nerve entrapment ([place])  . Carotid US  [id number]    [id number] LICA, normal RICA    Current Outpatient Prescriptions  Medication Sig Dispense Refill  . acetaminophen (TYLENOL) 500 MG tablet Take 500 mg by mouth daily.     Marland Kitchen alendronate (FOSAMAX) 70 MG tablet Take 70 mg by mouth once a week. Patient takes every Thursday    . ALPRAZolam (XANAX) 0.25 MG tablet TAKE 1 TABLET TWICE A DAY AS NEEDED (Patient taking differently: TAKE 1 TABLET TWICE A DAY AS NEEDED FOR ANXIETY) 60 tablet 0  . Ascorbic Acid (VITAMIN C) 1000 MG tablet Take 1,000 mg by mouth daily.    Marland Kitchen aspirin EC 81 MG tablet Take 81 mg by mouth daily.    . Calcium Carbonate-Vitamin D (CALCIUM + D PO) Take 1 tablet by mouth daily.    . carvedilol (COREG) 6.25 MG tablet Take 1 tablet (6.25 mg total) by mouth 2 (two) times daily with a meal. (Patient taking differently: 6.25 mg. Take 1/2 tablet twice daily) 180 tablet 4  . chlorpheniramine-HYDROcodone (TUSSIONEX PENNKINETIC ER) 10-8 MG/5ML SUER Take 5 mLs by mouth at bedtime as needed for cough. 140 mL 0  . Cholecalciferol (VITAMIN D3) 50000 units TABS Take 1 tablet by mouth once a week. 12 tablet 1  . ezetimibe (ZETIA) 10 MG tablet Take 1 tablet (10 mg total) by mouth daily. 30 tablet 3  . Ferrous Sulfate ([employer] SLOW  RELEASE IRON) 143 (45 Fe) MG TBCR Take 1 tablet by mouth daily. 90 tablet 3  . ketoconazole (NIZORAL) 2 % cream Apply 1 application topically daily. 15 g 0  . mometasone (ELOCON) 0.1 % lotion Apply 1 application topically as needed (for eczema). Reported on 05/05/2015    . nystatin (MYCOSTATIN) 100000 UNIT/ML suspension Take 5 mLs (500,000 Units total) by mouth 3 (three) times daily as needed. 100 mL 0  . omeprazole (PRILOSEC) 40 MG capsule TAKE ONE CAPSULE BY MOUTH TWICE A DAY BEFORE MEALS 60 capsule 11  . polyethylene glycol (MIRALAX / GLYCOLAX) packet Take 17 g by mouth daily.     . Probiotic Product (PROBIOTIC DAILY) CAPS Take 1 capsule by mouth daily.    . Pyridoxine HCl (VITAMIN B6 PO) Take 1 capsule by mouth daily.    . sertraline (ZOLOFT) 25 MG tablet Take 1 tablet (25 mg total) by mouth daily. 30 tablet 6  . spironolactone (ALDACTONE) 12.5 mg TABS tablet Take 12.5 mg by mouth daily.    Marland Kitchen triamcinolone (NASACORT) 55 MCG/ACT AERO nasal inhaler Place 2 sprays into the nose 2 (two) times daily.    . vitamin E 100 UNIT capsule Take 100 Units by mouth daily.    Marland Kitchen zolpidem (AMBIEN) 5 MG tablet TAKE 1/2 TO 1 TABLET AT BEDTIME AS NEEDED 30 tablet 0  . [DISCONTINUED] bisoprolol (ZEBETA) 5 MG tablet Take 5 mg by mouth daily.     No current facility-administered medications for this visit.    Allergies:  Antihistamines, chlorpheniramine-type; Codeine; Cyclobenzaprine; Cymbalta; Influenza vac split; Penicillins; Sulfasalazine; Atorvastatin; Chlorhexidine; Dilaudid; Latex; Morphine and related; Pravastatin; Prevacid; Red dye; Rosuvastatin; Simvastatin; Other; Tape; and Metaxalone   Social History:  The patient  reports that she has never smoked. She has never used smokeless tobacco. She reports that she does not drink alcohol or use illicit drugs.   Family History:  The patient's family history includes Alcohol abuse in her paternal grandfather; Arthritis in her mother; Colon polyps in her father;  Dementia in her mother; Emphysema in her mother; Heart disease in her paternal grandfather and paternal grandmother; Hypertension in her maternal grandfather; Lung cancer in her father; Osteoporosis in her mother; Stroke in her mother and paternal grandfather. There is no history of Colon cancer, Esophageal cancer, or Gallbladder disease.  ROS:   Review of Systems  Constitutional: Positive for weight loss and malaise/fatigue. Negative for fever, chills and diaphoresis.  HENT: Negative for congestion.   Cardiovascular: Positive for chest pain.  Gastrointestinal: Positive for diarrhea. Negative for nausea, vomiting, constipation, blood in stool and melena.  Musculoskeletal: Positive for myalgias, back pain, joint pain and neck pain. Negative for falls.  Skin: Negative for rash.  Neurological: Positive for weakness.  Endo/Heme/Allergies: Does not bruise/bleed easily.  Psychiatric/Behavioral: Positive for depression. Negative for substance abuse. The patient is nervous/anxious.   All other systems reviewed and are negative.     PHYSICAL EXAM:  VS:  BP 108/70 mmHg  Pulse 89  Ht 5' 5.5" (1.664 m)  Wt 174 lb 12.8 oz (79.289 kg)  BMI 28.64 kg/m2  SpO2 98% BMI: Body mass index is 28.64 kg/(m^2). Well nourished, well developed, in no acute distress HEENT: normocephalic, atraumatic Neck: no JVD, carotid bruits or masses Cardiac:  normal S1, S2; RRR; no murmurs, rubs, or gallops Lungs:  clear to auscultation bilaterally, no wheezing, rhonchi or rales Abd: soft, nontender, no hepatomegaly, + BS MS: no deformity or atrophy Ext: no edema Skin: warm and dry, no rash Neuro:  moves all extremities spontaneously, no focal abnormalities noted, follows commands Psych: euthymic mood, full affect   EKG:  Was ordered today. Shows NSR, 89 bpm, LBBB  Recent Labs: 12/25/2014: B Natriuretic Peptide 18.0 12/31/2014: ALT 19 04/05/2015: TSH 0.85 06/21/2015: BUN 22; Creatinine, Ser 1.21*; Hemoglobin 12.8;  Platelets 238.0; Potassium 4.3; Sodium 139  06/01/2015: Cholesterol 218*; Direct LDL 146.0; HDL 41.90; Total CHOL/HDL Ratio 5; Triglycerides 202.0*; VLDL 40.4*   Estimated Creatinine Clearance: 46.1 mL/min (by C-G formula based on Cr of 1.21).   Wt Readings from Last 3 Encounters:  07/07/15 174 lb 12.8 oz (79.289 kg)  06/21/15 177 lb 8 oz (80.513 kg)  06/16/15 177 lb (80.287 kg)     Other studies reviewed: Additional studies/records reviewed today include: summarized above  ASSESSMENT AND PLAN:  1. Atypical chest pain/new LBBB: This was discussed in detail with Dr. [name] who recommended cardiac cath vs nuclear stress test. She has known NICM with LV systolic function of [id number] by echo 03/2015. Options were presented to the patient who decided to proceed with Lexiscan Myoview rather than cardiac cath. Schedule echo to evaluate LV systolic function and wall motion. She is currently chest pain free. Continue current medications.   2. NICM: She does not appear to be volume overloaded. She had normalization of EF in 04/2014 and has maintained that by most recent echo. She has chronic DOE which is unchanged. Continue current medications.   3. HTN: Well controlled. No dizziness or presyncope.   4. Somatic  complaints: Follow up with PCP.   Disposition: F/u with Dr. [name], MD in 1 month  Current medicines are reviewed at length with the patient today.  The patient did not have any concerns regarding medicines.  Melvern Banker PA-C 07/07/2015 11:01 AM     [address], [place] [address] [phone]

## 2015-07-08 ENCOUNTER — Other Ambulatory Visit: Payer: Self-pay

## 2015-07-08 ENCOUNTER — Ambulatory Visit (INDEPENDENT_AMBULATORY_CARE_PROVIDER_SITE_OTHER): Payer: Medicare Other

## 2015-07-08 DIAGNOSIS — R079 Chest pain, unspecified: Secondary | ICD-10-CM | POA: Diagnosis not present

## 2015-07-08 LAB — C-REACTIVE PROTEIN: CRP: <0.5 mg/dL (ref <1.0)

## 2015-07-09 ENCOUNTER — Ambulatory Visit
Admission: RE | Admit: 2015-07-09 | Discharge: 2015-07-09 | Disposition: A | Discharge status: Home / Self Care | Payer: Medicare Other | Source: Ambulatory Visit | Attending: Physician Assistant | Admitting: Physician Assistant

## 2015-07-09 DIAGNOSIS — R079 Chest pain, unspecified: Secondary | ICD-10-CM | POA: Diagnosis not present

## 2015-07-09 DIAGNOSIS — I252 Old myocardial infarction: Secondary | ICD-10-CM | POA: Insufficient documentation

## 2015-07-09 LAB — NM MYOCAR MULTI W/SPECT W/WALL MOTION / EF
Estimated workload: 1 METS
Exercise duration (min): 0 min
Exercise duration (sec): 0 s
LV dias vol: 100 mL
LV sys vol: 40 mL
MPHR: 151 {beats}/min
Peak HR: 113 {beats}/min
Percent HR: 76 %
Percent of predicted max HR: 74 %
Rest HR: 78 {beats}/min
SDS: 5
SRS: 1
SSS: 11
Stage 1 Grade: 0 %
Stage 1 HR: 77 {beats}/min
Stage 1 Speed: 0 mph
Stage 2 Grade: 0 %
Stage 2 HR: 77 {beats}/min
Stage 2 Speed: 0 mph
Stage 3 Grade: 0 %
Stage 3 HR: 113 {beats}/min
Stage 3 Speed: 0 mph
Stage 4 DBP: 67 mmHg
Stage 4 Grade: 0 %
Stage 4 HR: 103 {beats}/min
Stage 4 SBP: 97 mmHg
Stage 4 Speed: 0 mph
TID: 0.82

## 2015-07-09 MED ORDER — TECHNETIUM TC 99M SESTAMIBI - CARDIOLITE
10.0000 | Freq: Once | INTRAVENOUS | Status: AC | PRN
  Administered 2015-07-09: 08:00:00 12.36 via INTRAVENOUS

## 2015-07-09 MED ORDER — TECHNETIUM TC 99M SESTAMIBI - CARDIOLITE
30.0000 | Freq: Once | INTRAVENOUS | Status: AC | PRN
  Administered 2015-07-09: 30.16 via INTRAVENOUS

## 2015-07-09 MED ORDER — REGADENOSON 0.4 MG/5ML IV SOLN
0.4000 mg | Freq: Once | INTRAVENOUS | Status: AC
  Administered 2015-07-09: 0.4 mg via INTRAVENOUS

## 2015-07-09 NOTE — Telephone Encounter (Signed)
Patient called in had no chest pain at the time we spoke but that she had some recently. Appointment scheduled and she saw Christell Faith PA and had testing.

## 2015-07-10 ENCOUNTER — Telehealth: Payer: Self-pay | Admitting: Internal Medicine

## 2015-07-10 NOTE — Telephone Encounter (Signed)
Called patient who was complaining of neck swelling and possible bruising of the neck. Patient was told to go to the ED to evaluate for a possible allergic reaction and possible imaging if needed if there is a possibility of infection. Patient denied gaining weight.

## 2015-07-12 ENCOUNTER — Encounter: Payer: Self-pay | Admitting: Family Medicine

## 2015-07-12 ENCOUNTER — Ambulatory Visit (INDEPENDENT_AMBULATORY_CARE_PROVIDER_SITE_OTHER): Payer: Medicare Other | Admitting: Family Medicine

## 2015-07-12 VITALS — BP 122/70 | HR 76 | Temp 97.9°F | Wt 175.5 lb

## 2015-07-12 DIAGNOSIS — N183 Chronic kidney disease, stage 3 unspecified: Secondary | ICD-10-CM

## 2015-07-12 DIAGNOSIS — R5382 Chronic fatigue, unspecified: Secondary | ICD-10-CM

## 2015-07-12 DIAGNOSIS — R5381 Other malaise: Secondary | ICD-10-CM

## 2015-07-12 DIAGNOSIS — M797 Fibromyalgia: Secondary | ICD-10-CM

## 2015-07-12 DIAGNOSIS — M2550 Pain in unspecified joint: Secondary | ICD-10-CM | POA: Diagnosis not present

## 2015-07-12 DIAGNOSIS — R51 Headache: Secondary | ICD-10-CM | POA: Diagnosis not present

## 2015-07-12 DIAGNOSIS — I428 Other cardiomyopathies: Secondary | ICD-10-CM

## 2015-07-12 DIAGNOSIS — J029 Acute pharyngitis, unspecified: Secondary | ICD-10-CM | POA: Insufficient documentation

## 2015-07-12 DIAGNOSIS — I429 Cardiomyopathy, unspecified: Secondary | ICD-10-CM

## 2015-07-12 LAB — RENAL FUNCTION PANEL
Albumin: 4.5 g/dL (ref 3.5–5.2)
BUN: 21 mg/dL (ref 6–23)
CO2: 27 mEq/L (ref 19–32)
Calcium: 9.9 mg/dL (ref 8.4–10.5)
Chloride: 101 mEq/L (ref 96–112)
Creatinine, Ser: 1.02 mg/dL (ref 0.40–1.20)
GFR: 56.95 mL/min — ABNORMAL LOW (ref >60.00)
Glucose, Bld: 90 mg/dL (ref 70–99)
Phosphorus: 3.1 mg/dL (ref 2.3–4.6)
Potassium: 4.4 mEq/L (ref 3.5–5.1)
Sodium: 137 mEq/L (ref 135–145)

## 2015-07-12 LAB — CBC WITH DIFFERENTIAL/PLATELET
Basophils Absolute: 0 10*3/uL (ref 0.0–0.1)
Basophils Relative: 0.5 % (ref 0.0–3.0)
Eosinophils Absolute: 0.1 10*3/uL (ref 0.0–0.7)
Eosinophils Relative: 1.3 % (ref 0.0–5.0)
HCT: 37.2 % (ref 36.0–46.0)
Hemoglobin: 12.4 g/dL (ref 12.0–15.0)
Lymphocytes Relative: 27.1 % (ref 12.0–46.0)
Lymphs Abs: 2.7 10*3/uL (ref 0.7–4.0)
MCHC: 33.4 g/dL (ref 30.0–36.0)
MCV: 90.3 fl (ref 78.0–100.0)
Monocytes Absolute: 0.8 10*3/uL (ref 0.1–1.0)
Monocytes Relative: 8.3 % (ref 3.0–12.0)
Neutro Abs: 6.3 10*3/uL (ref 1.4–7.7)
Neutrophils Relative %: 62.8 % (ref 43.0–77.0)
Platelets: 282 10*3/uL (ref 150.0–400.0)
RBC: 4.12 Mil/uL (ref 3.87–5.11)
RDW: 12.6 % (ref 11.5–15.5)
WBC: 10 10*3/uL (ref 4.0–10.5)

## 2015-07-12 NOTE — Patient Instructions (Addendum)
labwork today - we will let you know results.  Restart nasal saline along with nasacort.

## 2015-07-12 NOTE — Progress Notes (Signed)
Pre visit review using our clinic review tool, if applicable. No additional management support is needed unless otherwise documented below in the visit note. 

## 2015-07-12 NOTE — Assessment & Plan Note (Signed)
Undergoing cards workup. Appreciate their care of patient.

## 2015-07-12 NOTE — Progress Notes (Addendum)
BP 122/70 mmHg  Pulse 76  Temp(Src) 97.9 F (36.6 C) (Oral)  Wt 175 lb 8 oz (79.606 kg)   CC: ST, discuss lab results from ophtho  Subjective:    Patient ID: Ashley [name], female    DOB: 09-Oct-1945, 70 y.o.   MRN: [id number]  HPI: Ashley [name] is a 70 y.o. female presenting on 07/12/2015 for Follow-up and Sore Throat   Seen by Dr Sandra Cockayne 07/07/2015 with L temple pain and nonspecific visual complaints over 5 years. She did have lasik to L eye and since then felt intermittent blurred vision. At ophtho recent eval, concern for temporal arteritis but ESR 69, CRP normal. Referred here for further evaluation of other autoimmune condition.   Longstanding fibromyalgia diagnosis, longstanding arthralgias.  Saw cardiology with some chest soreness/pressure with stinging around pacemaker. Had abnormal EKG, then echo showed mod-severely reduced systolic function EF 73-22% with diffuse hypokinesis, mild MR ([place]).   Relevant past medical, surgical, family and social history reviewed and updated as indicated. Interim medical history since our last visit reviewed. Allergies and medications reviewed and updated. Current Outpatient Prescriptions on File Prior to Visit  Medication Sig  . acetaminophen (TYLENOL) 500 MG tablet Take 500 mg by mouth daily.   Marland Kitchen alendronate (FOSAMAX) 70 MG tablet Take 70 mg by mouth once a week. Patient takes every Thursday  . Ascorbic Acid (VITAMIN C) 1000 MG tablet Take 1,000 mg by mouth daily.  Marland Kitchen aspirin EC 81 MG tablet Take 81 mg by mouth daily.  . Calcium Carbonate-Vitamin D (CALCIUM + D PO) Take 1 tablet by mouth daily.  . carvedilol (COREG) 6.25 MG tablet Take 1 tablet (6.25 mg total) by mouth 2 (two) times daily with a meal. (Patient taking differently: 6.25 mg. Take 1/2 tablet twice daily)  . chlorpheniramine-HYDROcodone (TUSSIONEX PENNKINETIC ER) 10-8 MG/5ML SUER Take 5 mLs by mouth at bedtime as needed for cough.  . Cholecalciferol (VITAMIN D3)  50000 units TABS Take 1 tablet by mouth once a week.  . ezetimibe (ZETIA) 10 MG tablet Take 1 tablet (10 mg total) by mouth daily.  . Ferrous Sulfate ([employer] SLOW RELEASE IRON) 143 (45 Fe) MG TBCR Take 1 tablet by mouth daily.  . mometasone (ELOCON) 0.1 % lotion Apply 1 application topically as needed (for eczema). Reported on 05/05/2015  . nystatin (MYCOSTATIN) 100000 UNIT/ML suspension Take 5 mLs (500,000 Units total) by mouth 3 (three) times daily as needed.  Marland Kitchen omeprazole (PRILOSEC) 40 MG capsule TAKE ONE CAPSULE BY MOUTH TWICE A DAY BEFORE MEALS  . Probiotic Product (PROBIOTIC DAILY) CAPS Take 1 capsule by mouth daily.  . Pyridoxine HCl (VITAMIN B6 PO) Take 1 capsule by mouth daily. Reported on 07/12/2015  . sertraline (ZOLOFT) 25 MG tablet Take 1 tablet (25 mg total) by mouth daily.  Marland Kitchen spironolactone (ALDACTONE) 12.5 mg TABS tablet Take 12.5 mg by mouth daily.  Marland Kitchen triamcinolone (NASACORT) 55 MCG/ACT AERO nasal inhaler Place 2 sprays into the nose 2 (two) times daily.  . vitamin E 100 UNIT capsule Take 100 Units by mouth daily.  Marland Kitchen zolpidem (AMBIEN) 5 MG tablet TAKE 1/2 TO 1 TABLET AT BEDTIME AS NEEDED  . ALPRAZolam (XANAX) 0.25 MG tablet TAKE 1 TABLET TWICE A DAY AS NEEDED (Patient not taking: Reported on 07/12/2015)  . ketoconazole (NIZORAL) 2 % cream Apply 1 application topically daily. (Patient not taking: Reported on 07/12/2015)  . polyethylene glycol (MIRALAX / GLYCOLAX) packet Take 17 g by mouth daily. Reported on 07/12/2015  . [  DISCONTINUED] bisoprolol (ZEBETA) 5 MG tablet Take 5 mg by mouth daily.   No current facility-administered medications on file prior to visit.   Past Medical History  Diagnosis Date  . Migraine without aura, without mention of intractable migraine without mention of status migrainosus   . History of diverticulitis of colon   . Pure hypercholesterolemia   . Irritable bowel syndrome   . Laryngeal nodule     Dr. Thomasena Edis  . Fibrocystic breast disease   . Hemorrhoids    . Anxiety   . GERD (gastroesophageal reflux disease)   . Chronic systolic CHF (congestive heart failure) (HCC)     a. EF 25-35%, LV dilation, significant MR, goal weight 150lbs;  b. 04/2014 Echo: EF 50-55%, Gr 1 DD.  Marland Kitchen Nonischemic cardiomyopathy ([place])     a. Recovered LV fxn - prev as low as 25%, now 50-55% by echo 04/2014;  b. 2012 s/p MDT [id number] [place] VR single lead ICD.  Marland Kitchen History of pneumonia 2012  . Mitral regurgitation   . Anxiety   . Bronchiectasis     a. possibly mild, treat URIs aggressively  . Essential hypertension   . Chest pain     a. prev neg MV.  . Lung disease 07/14/11  . Anemia   . Fibromyalgia   . Depression   . Disorder of vocal cords   . Chronic sinusitis   . Insomnia   . Osteoporosis 01/2013  . sigmoid diverticulitis with perforation, abscess and fistula 09/30/2013  . H/O multiple allergies 10/10/2013  . PONV (postoperative nausea and vomiting)   . Pneumonia 2011  . Automatic implantable cardioverter-defibrillator in situ 2012    a. MDT single lead ICD.  Marland Kitchen Osteoarthritis     knees, back, hands, low back,   . Cancer (HCC)     basal cell, arms & neck  . Sleep apnea 2010    borderline, inconclusive- /w [place]   . Hemorrhoids   . Diarrhea 07/14/2008    Qualifier: Diagnosis of  By: Glori Bickers MD, Carmell Austria   . Allergy   . Pre-syncope     Past Surgical History  Procedure Laterality Date  . Appendectomy    . Mandible surgery      "got about 6 pins in the bottom of my jaw"  . Knee arthroscopy w/ orif      Left; "meniscus tear"  . Osteotomy      Left foot  . 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 Dexa  03/28/99    osteopenia L/S  . Colonoscopy  1998    nml (sigmoid-Gilbert-nol)  . Head mri, head ct  10/1998    ? H/A focus ([place])  . Emg/mcv  10/16/01    + mild carpal tunnel  . Dexa  03/12/03    2.2 Spine 0.1 Hip  Osteopenia  . Mri right hip  12/12/01    Tendonitis Gluteus Medius to Gtr Trochanter Neg Bursitis (Dr. Eulas Post)  . L/s films  11/21/01     nml; Right hip nml  . Mri u/s  11/21/01    min. DDD L/3-4  . Colonoscopy  01/04/04    polyps, diverticulosis/severe  . Dexa  11/14/05    1.9 Spine 0.3 Hip  slight improvement  . Flex laryngoscopy  06/11/06    ([place]) nml  . Dexa  11/22/06    improved osteo and fem neck  . [place] bunionectomy  11/04/08    Right Great Toe (Dr. Beola Cord)  .  Breast mass excision  01/2010    Left-fibrocystic change w/intraductal papilloma, no malignancy  . Ct chest  12/2010    possible bronchiectasis lower lobes, 02/2011 - enlarged bilateral effusions, bibasilar atx  . Stress myoview  02/2011    no significant ischemia  . Ct chest  04/2011    no PE.  abnormal R hilar and mediastinal adenopathy --> on rpt 05/2011, resolved  . US echocardiography  04/2011    mildly dilated LV, EF 25-30%, hypokinesis, restrictive physiology, mod MR, no AS  . Icd placement  07/14/11    w/pacemaker  . Tonsillectomy and adenoidectomy    . Breast biopsy      "I've had 2-3 bx; all benign"  . Augmentation mammaplasty    . 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 Abdominal hysterectomy  1987    w/BSO  . Lasik      left eye  . Knee surgery      left  . Mandible surgery    . Sinus surgery      x3 with balloon   . Dexa  01/2013    T score -2.6 at spine  . Partial colectomy N/A 10/02/2013    PARTIAL COLECTOMY;  Surgeon: Imogene Burn. Georgette Dover, MD  . Colostomy N/A 10/02/2013    DESCENDING COLOSTOMY;  Surgeon: Imogene Burn. Georgette Dover, MD  . Colostomy reversal N/A 02/10/2014    [name], MD  . Implantable cardioverter defibrillator implant N/A 07/14/2011    IMPLANTABLE CARDIOVERTER DEFIBRILLATOR IMPLANT;  Surgeon: Evans Lance, MD;  Location: [facility] C[facility] LAB;  Service: Cardiovascular;  Laterality: N/A;  . Esophagogastroduodenoscopy  09/2014    small HH, irregular Z line, fundic gland polyps with mild gastritis ([place])  . Ncs  11/2014    L ulnar neuropathy, mild B median nerve entrapment ([place])  .  Carotid US  72/0947    0-96% LICA, normal RICA    Social History  Substance Use Topics  . Smoking status: Never Smoker   . Smokeless tobacco: Never Used     Comment: Lived with smokers x 23 yrs, smoked herself "for a week"  . Alcohol Use: No    Family History  Problem Relation Age of Onset  . Lung cancer Father     + smoker  . Dementia Mother   . Osteoporosis Mother     Lumbar spine  . Stroke Mother     x 5 @ 4 YOA  . Arthritis Mother     hands  . Emphysema Mother   . Alcohol abuse Paternal Grandfather   . Hypertension Maternal Grandfather     ?  . Stroke Paternal Grandfather     ?  Marland Kitchen Heart disease Paternal Grandmother   . Heart disease Paternal Grandfather   . Colon polyps Father   . Colon cancer Neg Hx   . Esophageal cancer Neg Hx   . Gallbladder disease Neg Hx     Review of Systems Per HPI unless specifically indicated in ROS section     Objective:    BP 122/70 mmHg  Pulse 76  Temp(Src) 97.9 F (36.6 C) (Oral)  Wt 175 lb 8 oz (79.606 kg)  Wt Readings from Last 3 Encounters:  07/12/15 175 lb 8 oz (79.606 kg)  07/07/15 174 lb 12.8 oz (79.289 kg)  06/21/15 177 lb 8 oz (80.513 kg)    Physical Exam  Constitutional: She appears well-developed and well-nourished. No  distress.  HENT:  Mouth/Throat: Oropharynx is clear and moist. No oropharyngeal exudate.  No pain at temples  Neck: Normal range of motion. Neck supple. No thyromegaly present.  Cardiovascular: Normal rate, regular rhythm, normal heart sounds and intact distal pulses.   No murmur heard. Pulmonary/Chest: Effort normal and breath sounds normal. No respiratory distress. She has no wheezes. She has no rales.  Musculoskeletal: She exhibits no edema.  Lymphadenopathy:    She has no cervical adenopathy.  Skin: Skin is warm and dry. Rash noted.  Dried pustular lesions on back into L posterior arm  Psychiatric: She has a normal mood and affect.  Nursing note and vitals reviewed.     Assessment &  Plan:   Problem List Items Addressed This Visit    Sore throat    Pt attributes to reaction after pneumococcal vaccines. S/p benign ENT evaluation. Possible allergic contribution - rec continue flonase and start nasal saline irrigation daily.      Nonischemic cardiomyopathy ([place])    Undergoing cards workup. Appreciate their care of patient.      Fibromyalgia    Did not tolerate duloxetine. See below.       Relevant Orders   RNP Antibody   ANA   Rheumatoid factor   CKD (chronic kidney disease) stage 3, GFR 30-59 ml/min - Primary    Recheck Cr per pt request.       Relevant Orders   CBC with Differential/Platelet   Renal function panel   Chronic fatigue and malaise    Longstanding since 2015 after extensive surgical procedures. We started sertraline 10m daily last visit. Check for autoimmune cause today (check for MCTD and SLE).      Relevant Orders   RNP Antibody   ANA   Rheumatoid factor    Other Visit Diagnoses    Arthralgia        Relevant Orders    RNP Antibody    ANA    Rheumatoid factor        Follow up plan: No Follow-up on file.

## 2015-07-12 NOTE — Assessment & Plan Note (Addendum)
Pt attributes to reaction after pneumococcal vaccines. S/p benign ENT evaluation. Possible allergic contribution - rec continue flonase and start nasal saline irrigation daily.

## 2015-07-12 NOTE — Assessment & Plan Note (Signed)
Recheck Cr per pt request.

## 2015-07-12 NOTE — Assessment & Plan Note (Signed)
Did not tolerate duloxetine. See below.

## 2015-07-12 NOTE — Addendum Note (Signed)
Addended by: Marchia Bond on: 07/12/2015 02:12 PM   Modules accepted: SmartSet

## 2015-07-12 NOTE — Assessment & Plan Note (Signed)
Longstanding since 2015 after extensive surgical procedures. We started sertraline 25mg  daily last visit. Check for autoimmune cause today (check for MCTD and SLE).

## 2015-07-13 LAB — RNP ANTIBODY: Ribonucleic Protein(ENA) Antibody, IgG: <1

## 2015-07-13 LAB — ANTI-NUCLEAR AB-TITER (ANA TITER): ANA Titer 1: 1:80 {titer} — ABNORMAL HIGH

## 2015-07-13 LAB — ANA: Anti Nuclear Antibody(ANA): POSITIVE — AB

## 2015-07-13 LAB — RHEUMATOID FACTOR: Rhuematoid fact SerPl-aCnc: <10 IU/mL (ref <=14)

## 2015-07-15 ENCOUNTER — Telehealth: Payer: Self-pay | Admitting: Cardiovascular Disease

## 2015-07-15 NOTE — Telephone Encounter (Signed)
Pt is calling stating she would like the results to her stress test she did last week.  Please call once done.  She states she is going out of town soon and would like to know what to do about her health before then or if she needs to reschedule anything

## 2015-07-15 NOTE — Telephone Encounter (Signed)
Has not resulted yet. Will call pt w/ Dr. Donivan Scull recommendation.

## 2015-07-16 ENCOUNTER — Telehealth: Payer: Self-pay | Admitting: Family Medicine

## 2015-07-16 NOTE — Telephone Encounter (Signed)
Pt called checking on her lab results.

## 2015-07-17 ENCOUNTER — Other Ambulatory Visit: Payer: Self-pay | Admitting: Family Medicine

## 2015-07-17 DIAGNOSIS — R768 Other specified abnormal immunological findings in serum: Secondary | ICD-10-CM

## 2015-07-17 DIAGNOSIS — M159 Polyosteoarthritis, unspecified: Secondary | ICD-10-CM

## 2015-07-17 DIAGNOSIS — R519 Headache, unspecified: Secondary | ICD-10-CM

## 2015-07-17 DIAGNOSIS — M8949 Other hypertrophic osteoarthropathy, multiple sites: Secondary | ICD-10-CM

## 2015-07-17 DIAGNOSIS — I5022 Chronic systolic (congestive) heart failure: Secondary | ICD-10-CM

## 2015-07-17 DIAGNOSIS — R5381 Other malaise: Secondary | ICD-10-CM

## 2015-07-17 DIAGNOSIS — G629 Polyneuropathy, unspecified: Secondary | ICD-10-CM

## 2015-07-17 DIAGNOSIS — R5382 Chronic fatigue, unspecified: Secondary | ICD-10-CM

## 2015-07-17 DIAGNOSIS — I420 Dilated cardiomyopathy: Secondary | ICD-10-CM

## 2015-07-17 DIAGNOSIS — M150 Primary generalized (osteo)arthritis: Secondary | ICD-10-CM

## 2015-07-17 DIAGNOSIS — R51 Headache: Secondary | ICD-10-CM

## 2015-07-17 NOTE — Addendum Note (Signed)
Addended by: [name] on: 07/17/2015 10:29 AM   Modules accepted: Miquel Dunn

## 2015-07-17 NOTE — Telephone Encounter (Signed)
Released via mychart Pos ANA, mildly positive titers, will refer to rheum for further evaluation.

## 2015-07-19 NOTE — Telephone Encounter (Signed)
Per chart, patient has viewed results.

## 2015-07-21 DIAGNOSIS — R51 Headache: Secondary | ICD-10-CM | POA: Diagnosis not present

## 2015-07-22 DIAGNOSIS — M25562 Pain in left knee: Secondary | ICD-10-CM | POA: Diagnosis not present

## 2015-07-23 ENCOUNTER — Encounter: Payer: Self-pay | Admitting: Cardiovascular Disease

## 2015-07-23 ENCOUNTER — Ambulatory Visit (INDEPENDENT_AMBULATORY_CARE_PROVIDER_SITE_OTHER): Payer: Medicare Other | Admitting: Cardiovascular Disease

## 2015-07-23 VITALS — BP 110/76 | HR 89 | Ht 65.5 in | Wt 176.8 lb

## 2015-07-23 DIAGNOSIS — I255 Ischemic cardiomyopathy: Secondary | ICD-10-CM

## 2015-07-23 DIAGNOSIS — I429 Cardiomyopathy, unspecified: Secondary | ICD-10-CM

## 2015-07-23 DIAGNOSIS — I422 Other hypertrophic cardiomyopathy: Secondary | ICD-10-CM

## 2015-07-23 DIAGNOSIS — T50A95A Adverse effect of other bacterial vaccines, initial encounter: Secondary | ICD-10-CM

## 2015-07-23 DIAGNOSIS — I420 Dilated cardiomyopathy: Secondary | ICD-10-CM | POA: Diagnosis not present

## 2015-07-23 DIAGNOSIS — T887XXA Unspecified adverse effect of drug or medicament, initial encounter: Secondary | ICD-10-CM

## 2015-07-23 DIAGNOSIS — Z95810 Presence of automatic (implantable) cardiac defibrillator: Secondary | ICD-10-CM

## 2015-07-23 DIAGNOSIS — R079 Chest pain, unspecified: Secondary | ICD-10-CM

## 2015-07-23 DIAGNOSIS — I5022 Chronic systolic (congestive) heart failure: Secondary | ICD-10-CM

## 2015-07-23 MED ORDER — POTASSIUM CHLORIDE ER 10 MEQ PO TBCR: 10.0000 meq | EXTENDED_RELEASE_TABLET | Freq: Every day | ORAL | Status: DC | PRN

## 2015-07-23 MED ORDER — TORSEMIDE 10 MG PO TABS: 10.0000 mg | ORAL_TABLET | Freq: Every day | ORAL | Status: DC | PRN

## 2015-07-23 MED ORDER — SACUBITRIL-VALSARTAN 24-26 MG PO TABS: 1.0000 | ORAL_TABLET | Freq: Two times a day (BID) | ORAL | Status: DC

## 2015-07-23 NOTE — Patient Instructions (Addendum)
Please start entresto one pill twice a day  We will schedule a CT coronary calcium score for cardiomyopathy  Take torsemide 10 mg daily with potasium for weight >173  Labs in one month 2 to 3 weeks, BMP  Please call us if you have new issues that need to be addressed before your next appt.  Your physician wants you to follow-up in: 2 month.

## 2015-07-23 NOTE — Assessment & Plan Note (Signed)
Etiology of the decline in ejection fraction unclear though possible reaction to vaccine, unable to exclude viral URI.

## 2015-07-23 NOTE — Progress Notes (Signed)
Patient ID: Ashley [name], female    DOB: 10-Jun-1945, 70 y.o.   MRN: [id number]  HPI Comments: 70 y/o female with history of fibromyalgia, irritable bowel, pneumonia, chronic cough felt secondary to GERD, abnormal PFTs in the past, shortness of breath dating back to September 2012 that presented acutely, CT scan showing progressive pleural effusion , echo  October 2012 showing Cardiomyopathy  ejection fraction estimated at 25-35%,  Echo September 2013 with ejection fraction 30-35%,  She had ICD placed Previous  Lexiscan Myoview  showed no significant ischemia, no EKG changes concerning for ischemia.  EF 50 to 55% in 02/2013,  12/15,  And November 2016  Echocardiogram February 23rd 2017 with ejection fraction 30-35% She presents for follow-up today for follow-up of her cardiomyopathy  previous abdominal surgery May 2015   in follow-up today, we discussed her recent stress test finding an echocardiogram finding  Stress test shows perfusion abnormality to the anterior wall, significant artifact , attenuation issue secondary to left bundle branch block  Cardiogram with ejection fraction 30-35%   She reports having severe reaction to pneumonia shot with what sounds like viral symptoms,  Requiring prednisone, antibiotics.  She reports symptoms were severe , took her a long time to recover.  She's also having chronic abdominal pain, stress from her grandchildren.   currently not on any torsemide, she is tolerating Coreg and Aldactone  she does report having lower extremity edema, weight gain of 6 pounds   also having problems with her left eye like there is a film over her vision   other past medical history Recent carotid ultrasound showing minimal plaque on the left less than 39%, none on the right Previous dehydration with overuse of torsemide Eating more, no regular exercise, sedentary in general secondary to fibromyalgia and fatigue  Previous admission to [facility] in  December 2012 for bronchitis and hypoxia as well as systolic CHF. She had antibiotics, prednisone and diuresis and has felt better since discharge. ACE inhibitor placed on hold secondary to chronic cough.  S/P ICD 10 days ago for EF <35%.  Goal weight had been in the low 150 range. Previous Cardiac MRI showed ejection fraction 34 %    ICD placed  by Dr. Lovena Le.  Previous symptoms of malaise and fatigue, tachycardia, weight gain, leg swelling . previously taking high doses of diuretics for weight gain causing renal dysfunction, dehydration. For creatinine 1.9 we suggested she cut back on her diuretics. Prior weight was in the low 160 pound range  History of diverticulosis, abscess formation, perforation requiring a long course of antibiotics with PICC line,  subsequent CT scan Sep 30 2013 showing free air, abscess requiring partial colectomy. She has an ostomy. Today she has lost 22 pounds by her report.   Previous echocardiogram shows improvement in her ejection fraction up to 50%. Challenging image quality. On my own review, it does appear at least 45, if not 50%.   Allergies  Allergen Reactions  . Antihistamines, Chlorpheniramine-Type Other (See Comments)    Makes her eyes look like she sees strobe lights  . Codeine Other (See Comments)    Confusion and dizziness  . Cyclobenzaprine Other (See Comments)    Adverse reaction - back and throat pain, dizziness, exhaustion  . Cymbalta [Duloxetine Hcl] Other (See Comments)    "body spasms and made me feel weird in my head"   . Influenza Vac Split [Flu Virus Vaccine] Other (See Comments)    "allergic to concentrated eggs that  are in vaccine"  . Penicillins Other (See Comments)    REACTION: "told 50 years ago I couldn't take it; might be immune to it", able to take amoxicillin  . Sulfasalazine Other (See Comments)    "makes my whole body smell like sulfa & makes me sick just smelling it"  . Atorvastatin Other (See Comments)    Severe muscle  cramps to generic atorvastatin.  Able to take brand lipitor.  . Chlorhexidine Other (See Comments)    blisters  . Dilaudid [Hydromorphone Hcl] Other (See Comments)    Feels like she is burning internally   . Latex Nausea Only, Rash and Other (See Comments)    Pain and infection  . Morphine And Related Other (See Comments)    Internally feels like she is on fire   . Pravastatin Other (See Comments)    myalgias  . Prevacid [Lansoprazole] Other (See Comments)    Worsened GI side effects  . Red Dye Nausea Only and Swelling  . Rosuvastatin Other (See Comments)    Was not effective controlling lipids  . Simvastatin Other (See Comments)    Leg cramps  . Other     NOTE: pt is able to take cephalosporins without reaction  . Tape Other (See Comments)    All adhesives  - Blisters, itching and burning   . Metaxalone Other (See Comments)    REACTION: unknown    Outpatient Encounter Prescriptions as of 07/23/2015  Medication Sig  . acetaminophen (TYLENOL) 500 MG tablet Take 500 mg by mouth every 4 (four) hours as needed for mild pain or headache.   . alendronate (FOSAMAX) 70 MG tablet Take 70 mg by mouth once a week. Patient takes every Thursday  . ALPRAZolam (XANAX) 0.25 MG tablet TAKE 1 TABLET TWICE A DAY AS NEEDED  . Ascorbic Acid (VITAMIN C) 1000 MG tablet Take 1,000 mg by mouth daily.  Marland Kitchen aspirin EC 81 MG tablet Take 81 mg by mouth daily.  . Calcium Carbonate-Vitamin D (CALCIUM + D PO) Take 1 tablet by mouth daily.  . carvedilol (COREG) 6.25 MG tablet Take 1 tablet (6.25 mg total) by mouth 2 (two) times daily with a meal.  . chlorpheniramine-HYDROcodone (TUSSIONEX PENNKINETIC ER) 10-8 MG/5ML SUER Take 5 mLs by mouth at bedtime as needed for cough.  . Cholecalciferol (VITAMIN D3) 50000 units TABS Take 1 tablet by mouth once a week.  . ezetimibe (ZETIA) 10 MG tablet Take 1 tablet (10 mg total) by mouth daily.  . Ferrous Sulfate ([employer] SLOW RELEASE IRON) 143 (45 Fe) MG TBCR Take 1 tablet by  mouth daily.  Marland Kitchen ketoconazole (NIZORAL) 2 % cream Apply 1 application topically daily.  . mometasone (ELOCON) 0.1 % lotion Apply 1 application topically as needed (for eczema). Reported on 05/05/2015  . nystatin (MYCOSTATIN) 100000 UNIT/ML suspension Take 5 mLs (500,000 Units total) by mouth 3 (three) times daily as needed.  Marland Kitchen omeprazole (PRILOSEC) 40 MG capsule TAKE ONE CAPSULE BY MOUTH TWICE A DAY BEFORE MEALS  . polyethylene glycol (MIRALAX / GLYCOLAX) packet Take 17 g by mouth daily. Reported on 07/12/2015  . Probiotic Product (PROBIOTIC DAILY) CAPS Take 1 capsule by mouth daily.  . Pyridoxine HCl (VITAMIN B6 PO) Take 1 capsule by mouth daily. Reported on 07/12/2015  . spironolactone (ALDACTONE) 12.5 mg TABS tablet Take 12.5 mg by mouth daily.  Marland Kitchen triamcinolone (NASACORT) 55 MCG/ACT AERO nasal inhaler Place 2 sprays into the nose 2 (two) times daily.  . vitamin E 100 UNIT  capsule Take 100 Units by mouth daily.  Marland Kitchen zolpidem (AMBIEN) 5 MG tablet TAKE 1/2 TO 1 TABLET AT BEDTIME AS NEEDED  . potassium chloride (K-DUR) 10 MEQ tablet Take 1 tablet (10 mEq total) by mouth daily as needed.  . sacubitril-valsartan (ENTRESTO) 24-26 MG Take 1 tablet by mouth 2 (two) times daily.  Marland Kitchen torsemide (DEMADEX) 10 MG tablet Take 1 tablet (10 mg total) by mouth daily as needed.  . [DISCONTINUED] sertraline (ZOLOFT) 25 MG tablet Take 1 tablet (25 mg total) by mouth daily.   No facility-administered encounter medications on file as of 07/23/2015.    Past Medical History  Diagnosis Date  . Migraine without aura, without mention of intractable migraine without mention of status migrainosus   . History of diverticulitis of colon   . Pure hypercholesterolemia   . Irritable bowel syndrome   . Laryngeal nodule     Dr. Thomasena Edis  . Fibrocystic breast disease   . Hemorrhoids   . Anxiety   . GERD (gastroesophageal reflux disease)   . Chronic systolic CHF (congestive heart failure) (HCC)     a. EF 25-35%, LV dilation,  significant MR, goal weight 150lbs;  b. 04/2014 Echo: EF 50-55%, Gr 1 DD.  Marland Kitchen Nonischemic cardiomyopathy ([place])     a. Recovered LV fxn - prev as low as 25%, now 50-55% by echo 04/2014;  b. 2012 s/p MDT [id number] [place] VR single lead ICD.  Marland Kitchen History of pneumonia 2012  . Mitral regurgitation   . Anxiety   . Bronchiectasis     a. possibly mild, treat URIs aggressively  . Essential hypertension   . Chest pain     a. prev neg MV.  . Lung disease 07/14/11  . Anemia   . Fibromyalgia   . Depression   . Disorder of vocal cords   . Chronic sinusitis   . Insomnia   . Osteoporosis 01/2013  . sigmoid diverticulitis with perforation, abscess and fistula 09/30/2013  . H/O multiple allergies 10/10/2013  . PONV (postoperative nausea and vomiting)   . Pneumonia 2011  . Automatic implantable cardioverter-defibrillator in situ 2012    a. MDT single lead ICD.  Marland Kitchen Osteoarthritis     knees, back, hands, low back,   . Cancer (HCC)     basal cell, arms & neck  . Sleep apnea 2010    borderline, inconclusive- /w [place]   . Hemorrhoids   . Diarrhea 07/14/2008    Qualifier: Diagnosis of  By: Glori Bickers MD, Carmell Austria   . Allergy   . Pre-syncope     Past Surgical History  Procedure Laterality Date  . Appendectomy    . Mandible surgery      "got about 6 pins in the bottom of my jaw"  . Knee arthroscopy w/ orif      Left; "meniscus tear"  . Osteotomy      Left foot  . 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 Dexa  03/28/99    osteopenia L/S  . Colonoscopy  1998    nml (sigmoid-Gilbert-nol)  . Head mri, head ct  10/1998    ? H/A focus ([place])  . Emg/mcv  10/16/01    + mild carpal tunnel  . Dexa  03/12/03    2.2 Spine 0.1 Hip  Osteopenia  . Mri right hip  12/12/01    Tendonitis Gluteus Medius to Gtr Trochanter Neg Bursitis (Dr. Eulas Post)  . L/s films  11/21/01  nml; Right hip nml  . Mri u/s  11/21/01    min. DDD L/3-4  . Colonoscopy  01/04/04    polyps, diverticulosis/severe  . Dexa  11/14/05    1.9  Spine 0.3 Hip  slight improvement  . Flex laryngoscopy  06/11/06    ([place]) nml  . Dexa  11/22/06    improved osteo and fem neck  . [place] bunionectomy  11/04/08    Right Great Toe (Dr. Beola Cord)  . Breast mass excision  01/2010    Left-fibrocystic change w/intraductal papilloma, no malignancy  . Ct chest  12/2010    possible bronchiectasis lower lobes, 02/2011 - enlarged bilateral effusions, bibasilar atx  . Stress myoview  02/2011    no significant ischemia  . Ct chest  04/2011    no PE.  abnormal R hilar and mediastinal adenopathy --> on rpt 05/2011, resolved  . US echocardiography  04/2011    mildly dilated LV, EF 25-30%, hypokinesis, restrictive physiology, mod MR, no AS  . Icd placement  07/14/11    w/pacemaker  . Tonsillectomy and adenoidectomy    . Breast biopsy      "I've had 2-3 bx; all benign"  . Augmentation mammaplasty    . 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 Abdominal hysterectomy  1987    w/BSO  . Lasik      left eye  . Knee surgery      left  . Mandible surgery    . Sinus surgery      x3 with balloon   . Dexa  01/2013    T score -2.6 at spine  . Partial colectomy N/A 10/02/2013    PARTIAL COLECTOMY;  Surgeon: Imogene Burn. Georgette Dover, MD  . Colostomy N/A 10/02/2013    DESCENDING COLOSTOMY;  Surgeon: Imogene Burn. Georgette Dover, MD  . Colostomy reversal N/A 02/10/2014    [name], MD  . Implantable cardioverter defibrillator implant N/A 07/14/2011    IMPLANTABLE CARDIOVERTER DEFIBRILLATOR IMPLANT;  Surgeon: Evans Lance, MD;  Location: [facility] C[facility] LAB;  Service: Cardiovascular;  Laterality: N/A;  . Esophagogastroduodenoscopy  09/2014    small HH, irregular Z line, fundic gland polyps with mild gastritis ([place])  . Ncs  11/2014    L ulnar neuropathy, mild B median nerve entrapment ([place])  . Carotid US  [id number]    [id number] LICA, normal RICA    Social History  reports that she has never smoked. She has never used smokeless tobacco. She  reports that she does not drink alcohol or use illicit drugs.  Family History family history includes Alcohol abuse in her paternal grandfather; Arthritis in her mother; Colon polyps in her father; Dementia in her mother; Emphysema in her mother; Heart disease in her paternal grandfather and paternal grandmother; Hypertension in her maternal grandfather; Lung cancer in her father; Osteoporosis in her mother; Stroke in her mother and paternal grandfather. There is no history of Colon cancer, Esophageal cancer, or Gallbladder disease.       Review of Systems  Constitutional: Positive for fatigue.  Respiratory: Positive for shortness of breath.   Cardiovascular: Positive for leg swelling.  Gastrointestinal: Positive for abdominal pain.  Musculoskeletal: Positive for myalgias, back pain, arthralgias and neck stiffness.  Skin: Negative.   Neurological: Negative.   Hematological: Negative.   Psychiatric/Behavioral: Negative.   All other systems reviewed and are negative.   BP 110/76 mmHg  Pulse 89  Ht  5' 5.5" (1.664 m)  Wt 176 lb 12.8 oz (80.196 kg)  BMI 28.96 kg/m2  SpO2 97%  Physical Exam  Constitutional: She is oriented to person, place, and time. She appears well-developed and well-nourished.  HENT:  Head: Normocephalic.  Nose: Nose normal.  Mouth/Throat: Oropharynx is clear and moist.  Eyes: Conjunctivae are normal. Pupils are equal, round, and reactive to light.  Neck: Normal range of motion. Neck supple. No JVD present.  Cardiovascular: Normal rate, regular rhythm, S1 normal, S2 normal, normal heart sounds and intact distal pulses.  Exam reveals no gallop and no friction rub.   No murmur heard. Pulmonary/Chest: Effort normal and breath sounds normal. No respiratory distress. She has no wheezes. She has no rales. She exhibits no tenderness.  Abdominal: Soft. Bowel sounds are normal. She exhibits no distension. There is no tenderness.  Musculoskeletal: Normal range of motion.  She exhibits no edema or tenderness.  Lymphadenopathy:    She has no cervical adenopathy.  Neurological: She is alert and oriented to person, place, and time. Coordination normal.  Skin: Skin is warm and dry. No rash noted. No erythema.  Psychiatric: She has a normal mood and affect. Her behavior is normal. Judgment and thought content normal.    Assessment and Plan  Nursing note and vitals reviewed.

## 2015-07-23 NOTE — Assessment & Plan Note (Signed)
On today's visit, she denies any significant recent chest pain , she does have shortness of breath on exertion.  Recent stress test is difficult to decipher given the bundle branch block and attenuation artifact.  Suspect nonischemic cardiomyopathy.  We did discuss other options as she does not want cardiac catheterization.  Recommended CT coronary calcium scoring.  If there is concern of significant stenosis, could order cardiac CTA

## 2015-07-23 NOTE — Assessment & Plan Note (Signed)
Followed by Dr. Klein 

## 2015-07-23 NOTE — Assessment & Plan Note (Signed)
Medication changes as above, torsemide with potassium sparingly for leg edema, weight gain  Also will start entresto

## 2015-07-23 NOTE — Assessment & Plan Note (Signed)
Decline in her ejection fraction likely stress related  Recent significant upper respiratory infection , unable to exclude reaction to a pneumonia shot the symptoms do sound like viral URI ( similar to previous symptoms in 2012).  Illness has resolved, still with low ejection fraction.  Recommended she continue on her current medications, we will start entresto 24/26 mg  By mouth twice a day.  Also recommended she take torsemide 10 mg sparingly for leg edema,  Perhaps take the diuretic for weight more than 273 pounds.  Current weight 276 pounds in the office today  Previously was taking torsemide aggressively and had acute renal dysfunction   Total encounter time more than 25 minutes  Greater than 50% was spent in counseling and coordination of care with the patient

## 2015-07-27 DIAGNOSIS — M180 Bilateral primary osteoarthritis of first carpometacarpal joints: Secondary | ICD-10-CM | POA: Diagnosis not present

## 2015-07-27 DIAGNOSIS — M65312 Trigger thumb, left thumb: Secondary | ICD-10-CM | POA: Diagnosis not present

## 2015-07-28 ENCOUNTER — Ambulatory Visit (INDEPENDENT_AMBULATORY_CARE_PROVIDER_SITE_OTHER)
Admission: RE | Admit: 2015-07-28 | Discharge: 2015-07-28 | Disposition: A | Discharge status: Home / Self Care | Payer: Self-pay | Source: Ambulatory Visit | Attending: Cardiovascular Disease | Admitting: Cardiovascular Disease

## 2015-07-28 DIAGNOSIS — I420 Dilated cardiomyopathy: Secondary | ICD-10-CM

## 2015-08-04 DIAGNOSIS — M797 Fibromyalgia: Secondary | ICD-10-CM | POA: Diagnosis not present

## 2015-08-04 DIAGNOSIS — R768 Other specified abnormal immunological findings in serum: Secondary | ICD-10-CM | POA: Diagnosis not present

## 2015-08-04 DIAGNOSIS — R51 Headache: Secondary | ICD-10-CM | POA: Diagnosis not present

## 2015-08-04 DIAGNOSIS — H04123 Dry eye syndrome of bilateral lacrimal glands: Secondary | ICD-10-CM | POA: Diagnosis not present

## 2015-08-05 ENCOUNTER — Encounter: Payer: Self-pay | Admitting: Family Medicine

## 2015-08-05 ENCOUNTER — Ambulatory Visit (INDEPENDENT_AMBULATORY_CARE_PROVIDER_SITE_OTHER): Payer: Medicare Other | Admitting: Family Medicine

## 2015-08-05 ENCOUNTER — Ambulatory Visit: Payer: Medicare Other | Admitting: Cardiovascular Disease

## 2015-08-05 DIAGNOSIS — B9789 Other viral agents as the cause of diseases classified elsewhere: Principal | ICD-10-CM

## 2015-08-05 DIAGNOSIS — I255 Ischemic cardiomyopathy: Secondary | ICD-10-CM

## 2015-08-05 DIAGNOSIS — J069 Acute upper respiratory infection, unspecified: Secondary | ICD-10-CM | POA: Insufficient documentation

## 2015-08-05 LAB — POCT INFLUENZA A/B
Influenza A, POC: NEGATIVE
Influenza B, POC: NEGATIVE

## 2015-08-05 MED ORDER — FIRST-DUKES MOUTHWASH MT SUSP: OROMUCOSAL | Status: DC

## 2015-08-05 MED ORDER — LEVOFLOXACIN 500 MG PO TABS: 500.0000 mg | ORAL_TABLET | Freq: Every day | ORAL | Status: DC

## 2015-08-05 MED ORDER — HYDROCOD POLST-CPM POLST ER 10-8 MG/5ML PO SUER: 5.0000 mL | Freq: Two times a day (BID) | ORAL | Status: DC | PRN

## 2015-08-05 MED ORDER — PREDNISONE 50 MG PO TABS: ORAL_TABLET | ORAL | Status: DC

## 2015-08-05 NOTE — Progress Notes (Signed)
Subjective:  Patient ID: Ashley [name], female    DOB: [birth year]  Age: 70 y.o. MRN: [id number]  CC: Cough, fever, chest tightness, weakness, fatigue  HPI:  70 year old female with a complicated PMH including Chronic systolic CHF presents with the above complaints.   Patient began to not feel well on Tuesday. She has been experiencing severe cough, fever (Tmax 102), chills, body aches, weakness, fatigue, chest tightness. Cough is mildly productive. She's been taking over-the-counter Mucinex DM without resolution in her symptoms. Patient is concerned that she has bronchitis and is requesting antibiotics today. No known exacerbating factors.  Of note, patient states that she recently received a pneumonia vaccine and had an adverse reaction. She attributes recent drop in EF to illness.  Social Hx   Social History   Social History  . Marital Status: Married    Spouse Name: N/A  . Number of Children: 1  . Years of Education: N/A   Occupational History  . 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 [employer]    Social History Main Topics  . Smoking status: Never Smoker   . Smokeless tobacco: Never Used     Comment: Lived with smokers x 23 yrs, smoked herself "for a week"  . Alcohol Use: No  . Drug Use: No  . Sexual Activity: Not Currently   Other Topics Concern  . None   Social History Narrative   1 adopted child      Lives with husband            Review of Systems  Constitutional: Positive for fever, chills and fatigue.  HENT: Positive for sore throat.   Respiratory: Positive for cough.    Objective:  BP 104/58 mmHg  Pulse 105  Temp(Src) 98.7 F (37.1 C) (Oral)  Wt 176 lb 6 oz (80.003 kg)  SpO2 97%  BP/Weight 08/05/2015 07/23/2015 [id number]  Systolic BP 123456 [id number]  Diastolic BP 58 76 70  Wt. (Lbs) 176.38 176.8 175.5  BMI 28.89 28.96 28.75   Physical Exam  Constitutional:  Resting on table. NAD.   HENT:  Head: Normocephalic and  atraumatic.  Mouth/Throat: Oropharynx is clear and moist.  Normal TM's bilaterally.   Cardiovascular: Regular rhythm.  Tachycardia present.   Pulmonary/Chest: Effort normal and breath sounds normal. She has no wheezes. She has no rales.  Neurological: She is alert.  Vitals reviewed.   Lab Results  Component Value Date   WBC 10.0 07/12/2015   HGB 12.4 07/12/2015   HCT 37.2 07/12/2015   PLT 282.0 07/12/2015   GLUCOSE 90 07/12/2015   CHOL 218* 06/01/2015   TRIG 202.0* 06/01/2015   HDL 41.90 06/01/2015   LDLDIRECT 146.0 06/01/2015   LDLCALC 109* 05/27/2014   ALT 19 12/31/2014   AST 15 12/31/2014   NA 137 07/12/2015   K 4.4 07/12/2015   CL 101 07/12/2015   CREATININE 1.02 07/12/2015   BUN 21 07/12/2015   CO2 27 07/12/2015   TSH 0.85 04/05/2015   INR 0.92 07/14/2011   HGBA1C 5.8 06/01/2015    Assessment & Plan:   Problem List Items Addressed This Visit    Viral URI with cough    New acute problem. Flu testing negative.  Patient with a complicated past medical history including chronic cough. Exam nonfocal today. This appears to be viral in origin and I informed her of this today. Patient is once again adamant about antibiotics stating that she has to have an antibiotic.  Treating with Prednisone and tussionex.        Relevant Medications   Diphenhyd-Hydrocort-Nystatin (FIRST-DUKES MOUTHWASH) SUSP   Other Relevant Orders   POCT Influenza A/B (Completed)      Meds ordered this encounter  Medications  . predniSONE (DELTASONE) 50 MG tablet    Sig: 1 tablet daily x 5 days.    Dispense:  5 tablet    Refill:  0  . chlorpheniramine-HYDROcodone (TUSSIONEX PENNKINETIC ER) 10-8 MG/5ML SUER    Sig: Take 5 mLs by mouth every 12 (twelve) hours as needed.    Dispense:  115 mL    Refill:  0  . levofloxacin (LEVAQUIN) 500 MG tablet    Sig: Take 1 tablet (500 mg total) by mouth daily.    Dispense:  7 tablet    Refill:  0  . Diphenhyd-Hydrocort-Nystatin (FIRST-DUKES  MOUTHWASH) SUSP    Sig: Swish 1-2 times daily as needed.    Dispense:  237 mL    Refill:  0    Follow-up: PRN  [place]

## 2015-08-05 NOTE — Progress Notes (Signed)
Pre visit review using our clinic review tool, if applicable. No additional management support is needed unless otherwise documented below in the visit note. 

## 2015-08-05 NOTE — Assessment & Plan Note (Addendum)
New acute problem. Flu testing negative.  Patient with a complicated past medical history including chronic cough. Exam nonfocal today. This appears to be viral in origin and I informed her of this today. Patient is once again adamant about antibiotics stating that she has to have an antibiotic. Treating with Prednisone and tussionex.

## 2015-08-05 NOTE — Patient Instructions (Signed)
Take the medications as prescribed.  Follow up closely with Dr. Darnell Level  Take care  Dr. Lacinda Axon

## 2015-08-11 ENCOUNTER — Telehealth: Payer: Self-pay | Admitting: Cardiovascular Disease

## 2015-08-11 NOTE — Telephone Encounter (Signed)
Ejection fraction likely is low in the setting of upper respiratory infection Best thing she can do is try to get better, then we need time for her to recondition her heart, get ejection fraction better  She's welcome to come in for lab work, EKG, will likely not change what we do Need her to get lungs clear

## 2015-08-11 NOTE — Telephone Encounter (Signed)
Pt calling stating she thinks her "infraction" may be having some more issues. Is coming on Friday to do some lab work.  Would like to come in also to see if she can see a nurse to do an ekg I stated to her we may be able to at 10  I wanted to make sure before I Did that.  Please advise

## 2015-08-11 NOTE — Telephone Encounter (Signed)
Patient stated that she has been sick with a virus and has not been feeling well and she thinks her ejection fraction is low again. She wanted to know if when she comes in on Friday for labs if we could listen to her heart or do an EKG to see how things are going. Tried to explain that those assessments would not allow [place] to determine her current EF. Let her know that while she is sick, on antibiotics, and on steroids this may make her feel bad and not like herself. She still wanted to know if someone could check it on Friday. Let her know that I would forward it to Dr. [name] and I would let her know what he would like to do.

## 2015-08-12 NOTE — Telephone Encounter (Signed)
Spoke w/ pt.  Advised her of Dr. Donivan Scull recommendation. She states that she has episodes of feeling shaky and woozy. She is unsure if sx are r/t Entresto. Reiterated the importance of resting and making sure she gets over her URI before making any med changes. She states that she is still waiting to hear back from her ins regarding the final price, but it will cost her >$300 w/o ins. Advised her that I am leaving samples of Entresto 24/26mg  and pt assistance form at the front desk for her to pick.

## 2015-08-13 ENCOUNTER — Other Ambulatory Visit (INDEPENDENT_AMBULATORY_CARE_PROVIDER_SITE_OTHER): Payer: Medicare Other

## 2015-08-13 DIAGNOSIS — M25562 Pain in left knee: Secondary | ICD-10-CM | POA: Diagnosis not present

## 2015-08-13 DIAGNOSIS — I420 Dilated cardiomyopathy: Secondary | ICD-10-CM

## 2015-08-13 DIAGNOSIS — M1712 Unilateral primary osteoarthritis, left knee: Secondary | ICD-10-CM | POA: Diagnosis not present

## 2015-08-14 LAB — BASIC METABOLIC PANEL
BUN/Creatinine Ratio: 25 (ref 11–26)
BUN: 40 mg/dL — ABNORMAL HIGH (ref 8–27)
CO2: 20 mmol/L (ref 18–29)
Calcium: 9.6 mg/dL (ref 8.7–10.3)
Chloride: 95 mmol/L — ABNORMAL LOW (ref 96–106)
Creatinine, Ser: 1.57 mg/dL — ABNORMAL HIGH (ref 0.57–1.00)
GFR calc Af Amer: 38 mL/min/{1.73_m2} — ABNORMAL LOW (ref >59)
GFR calc non Af Amer: 33 mL/min/{1.73_m2} — ABNORMAL LOW (ref >59)
Glucose: 139 mg/dL — ABNORMAL HIGH (ref 65–99)
Potassium: 4.8 mmol/L (ref 3.5–5.2)
Sodium: 139 mmol/L (ref 134–144)

## 2015-08-23 ENCOUNTER — Ambulatory Visit: Payer: Medicare Other | Admitting: Neurology

## 2015-08-23 DIAGNOSIS — Z029 Encounter for administrative examinations, unspecified: Secondary | ICD-10-CM

## 2015-08-30 ENCOUNTER — Encounter: Payer: Self-pay | Admitting: Family Medicine

## 2015-08-30 ENCOUNTER — Ambulatory Visit (INDEPENDENT_AMBULATORY_CARE_PROVIDER_SITE_OTHER): Payer: Medicare Other | Admitting: Family Medicine

## 2015-08-30 VITALS — BP 94/60 | HR 83 | Temp 97.9°F | Wt 174.0 lb

## 2015-08-30 DIAGNOSIS — B9789 Other viral agents as the cause of diseases classified elsewhere: Secondary | ICD-10-CM

## 2015-08-30 DIAGNOSIS — M797 Fibromyalgia: Secondary | ICD-10-CM

## 2015-08-30 DIAGNOSIS — I255 Ischemic cardiomyopathy: Secondary | ICD-10-CM | POA: Diagnosis not present

## 2015-08-30 DIAGNOSIS — I429 Cardiomyopathy, unspecified: Secondary | ICD-10-CM | POA: Diagnosis not present

## 2015-08-30 DIAGNOSIS — R109 Unspecified abdominal pain: Secondary | ICD-10-CM | POA: Diagnosis not present

## 2015-08-30 DIAGNOSIS — I10 Essential (primary) hypertension: Secondary | ICD-10-CM | POA: Diagnosis not present

## 2015-08-30 DIAGNOSIS — J069 Acute upper respiratory infection, unspecified: Secondary | ICD-10-CM

## 2015-08-30 DIAGNOSIS — N183 Chronic kidney disease, stage 3 unspecified: Secondary | ICD-10-CM

## 2015-08-30 DIAGNOSIS — I428 Other cardiomyopathies: Secondary | ICD-10-CM

## 2015-08-30 DIAGNOSIS — R5382 Chronic fatigue, unspecified: Secondary | ICD-10-CM

## 2015-08-30 DIAGNOSIS — K625 Hemorrhage of anus and rectum: Secondary | ICD-10-CM | POA: Insufficient documentation

## 2015-08-30 DIAGNOSIS — M26609 Unspecified temporomandibular joint disorder, unspecified side: Secondary | ICD-10-CM | POA: Insufficient documentation

## 2015-08-30 DIAGNOSIS — R5381 Other malaise: Secondary | ICD-10-CM

## 2015-08-30 LAB — CBC WITH DIFFERENTIAL/PLATELET
Basophils Absolute: 0.1 10*3/uL (ref 0.0–0.1)
Basophils Relative: 0.7 % (ref 0.0–3.0)
Eosinophils Absolute: 0.3 10*3/uL (ref 0.0–0.7)
Eosinophils Relative: 2.8 % (ref 0.0–5.0)
HCT: 37.4 % (ref 36.0–46.0)
Hemoglobin: 12.5 g/dL (ref 12.0–15.0)
Lymphocytes Relative: 23.7 % (ref 12.0–46.0)
Lymphs Abs: 2.7 10*3/uL (ref 0.7–4.0)
MCHC: 33.4 g/dL (ref 30.0–36.0)
MCV: 89.5 fl (ref 78.0–100.0)
Monocytes Absolute: 1 10*3/uL (ref 0.1–1.0)
Monocytes Relative: 8.6 % (ref 3.0–12.0)
Neutro Abs: 7.4 10*3/uL (ref 1.4–7.7)
Neutrophils Relative %: 64.2 % (ref 43.0–77.0)
Platelets: 253 10*3/uL (ref 150.0–400.0)
RBC: 4.18 Mil/uL (ref 3.87–5.11)
RDW: 12.9 % (ref 11.5–15.5)
WBC: 11.5 10*3/uL — ABNORMAL HIGH (ref 4.0–10.5)

## 2015-08-30 LAB — COMPREHENSIVE METABOLIC PANEL
ALT: 19 U/L (ref 0–35)
AST: 19 U/L (ref 0–37)
Albumin: 4.7 g/dL (ref 3.5–5.2)
Alkaline Phosphatase: 90 U/L (ref 39–117)
BUN: 42 mg/dL — ABNORMAL HIGH (ref 6–23)
CO2: 25 mEq/L (ref 19–32)
Calcium: 10.5 mg/dL (ref 8.4–10.5)
Chloride: 98 mEq/L (ref 96–112)
Creatinine, Ser: 1.35 mg/dL — ABNORMAL HIGH (ref 0.40–1.20)
GFR: 41.19 mL/min — ABNORMAL LOW (ref >60.00)
Glucose, Bld: 86 mg/dL (ref 70–99)
Potassium: 4.8 mEq/L (ref 3.5–5.1)
Sodium: 136 mEq/L (ref 135–145)
Total Bilirubin: 0.3 mg/dL (ref 0.2–1.2)
Total Protein: 8.4 g/dL — ABNORMAL HIGH (ref 6.0–8.3)

## 2015-08-30 LAB — LIPASE: Lipase: 42 U/L (ref 11.0–59.0)

## 2015-08-30 NOTE — Assessment & Plan Note (Signed)
Decrease in EF after bronchitis vs prevnar related decline. Followed by cards, likely with rpt echo in upcoming months. Reviewed med regimen and recent calcium cardiac score of 0

## 2015-08-30 NOTE — Progress Notes (Signed)
BP 94/60 mmHg  Pulse 83  Temp(Src) 97.9 F (36.6 C) (Oral)  Wt 174 lb (78.926 kg)  SpO2 97%   CC: 3 mo f/u visit  Subjective:    Patient ID: Ashley [name], female    DOB: [birth year], 70 y.o.   MRN: [id number]  HPI: Ashley [name] is a 70 y.o. female presenting on 08/30/2015 for Follow-up   Planning cruise Friday ([place], [place]). Just bought 1 story house [facility] [place].  Last seen here 07/12/2015. In interim, saw Dr Lacinda Axon with URI treated with levaquin course, had rheumatological evaluation - no rheumatological disease, saw Dr [name] in f/u and was started on entresto for worsening EF to 30-35%. Likely nonischemic cardiomyopathy (?viral or pneumovax related). Suggested start torsemide 10mg  sparingly PRN leg swelling. rec torsemide if weight >173lbs. She has been taking 10mg  torsemide daily. ICD in place followed by Dr Caryl Comes. Underwent CT coronary calcium scoring = 0.   She is feeling very poorly. Ongoing malaise, fatigue, leg weakness.   2 wks ago went to [place] for jewelry making class. Sat on wooden chair and then bled per rectum for 1 wk (bright red) painful to use bathroom. No hemorrhoids felt. Had diarrhea all day yesterday without blood.   Relevant past medical, surgical, family and social history reviewed and updated as indicated. Interim medical history since our last visit reviewed. Allergies and medications reviewed and updated. Current Outpatient Prescriptions on File Prior to Visit  Medication Sig  . acetaminophen (TYLENOL) 500 MG tablet Take 500 mg by mouth every 4 (four) hours as needed for mild pain or headache.   . alendronate (FOSAMAX) 70 MG tablet Take 70 mg by mouth once a week. Patient takes every Thursday  . ALPRAZolam (XANAX) 0.25 MG tablet TAKE 1 TABLET TWICE A DAY AS NEEDED  . Ascorbic Acid (VITAMIN C) 1000 MG tablet Take 1,000 mg by mouth daily.  Marland Kitchen aspirin EC 81 MG tablet Take 81 mg by mouth daily.  . Calcium Carbonate-Vitamin D  (CALCIUM + D PO) Take 1 tablet by mouth daily.  . carvedilol (COREG) 6.25 MG tablet Take 1 tablet (6.25 mg total) by mouth 2 (two) times daily with a meal.  . chlorpheniramine-HYDROcodone (TUSSIONEX PENNKINETIC ER) 10-8 MG/5ML SUER Take 5 mLs by mouth every 12 (twelve) hours as needed.  . Cholecalciferol (VITAMIN D3) 50000 units TABS Take 1 tablet by mouth once a week.  . Diphenhyd-Hydrocort-Nystatin (FIRST-DUKES MOUTHWASH) SUSP Swish 1-2 times daily as needed.  . ezetimibe (ZETIA) 10 MG tablet Take 1 tablet (10 mg total) by mouth daily.  . Ferrous Sulfate ([employer] SLOW RELEASE IRON) 143 (45 Fe) MG TBCR Take 1 tablet by mouth daily.  Marland Kitchen ketoconazole (NIZORAL) 2 % cream Apply 1 application topically daily.  . mometasone (ELOCON) 0.1 % lotion Apply 1 application topically as needed (for eczema). Reported on 05/05/2015  . nystatin (MYCOSTATIN) 100000 UNIT/ML suspension Take 5 mLs (500,000 Units total) by mouth 3 (three) times daily as needed.  Marland Kitchen omeprazole (PRILOSEC) 40 MG capsule TAKE ONE CAPSULE BY MOUTH TWICE A DAY BEFORE MEALS  . polyethylene glycol (MIRALAX / GLYCOLAX) packet Take 17 g by mouth daily. Reported on 07/12/2015  . potassium chloride (K-DUR) 10 MEQ tablet Take 1 tablet (10 mEq total) by mouth daily as needed.  . predniSONE (DELTASONE) 50 MG tablet 1 tablet daily x 5 days.  . Probiotic Product (PROBIOTIC DAILY) CAPS Take 1 capsule by mouth daily.  . Pyridoxine HCl (VITAMIN B6 PO) Take 1  capsule by mouth daily. Reported on 07/12/2015  . sacubitril-valsartan (ENTRESTO) 24-26 MG Take 1 tablet by mouth 2 (two) times daily.  Marland Kitchen spironolactone (ALDACTONE) 12.5 mg TABS tablet Take 12.5 mg by mouth daily.  Marland Kitchen torsemide (DEMADEX) 10 MG tablet Take 1 tablet (10 mg total) by mouth daily as needed.  . triamcinolone (NASACORT) 55 MCG/ACT AERO nasal inhaler Place 2 sprays into the nose 2 (two) times daily.  . vitamin E 100 UNIT capsule Take 100 Units by mouth daily.  Marland Kitchen zolpidem (AMBIEN) 5 MG tablet TAKE  1/2 TO 1 TABLET AT BEDTIME AS NEEDED  . [DISCONTINUED] bisoprolol (ZEBETA) 5 MG tablet Take 5 mg by mouth daily.   No current facility-administered medications on file prior to visit.    Review of Systems Per HPI unless specifically indicated in ROS section     Objective:    BP 94/60 mmHg  Pulse 83  Temp(Src) 97.9 F (36.6 C) (Oral)  Wt 174 lb (78.926 kg)  SpO2 97%  Wt Readings from Last 3 Encounters:  08/30/15 174 lb (78.926 kg)  08/05/15 176 lb 6 oz (80.003 kg)  07/23/15 176 lb 12.8 oz (80.196 kg)   Body mass index is 28.5 kg/(m^2).  Physical Exam  Constitutional: She appears well-developed and well-nourished. No distress.  Looks well today  HENT:  Head: Normocephalic and atraumatic.  Right Ear: Hearing, tympanic membrane, external ear and ear canal normal.  Left Ear: Hearing, tympanic membrane, external ear and ear canal normal.  Nose: No mucosal edema or rhinorrhea.  Mouth/Throat: Oropharynx is clear and moist. No oropharyngeal exudate, posterior oropharyngeal edema, posterior oropharyngeal erythema or tonsillar abscesses.  L nasal mucosal irritation R TMJ looser than L  Eyes: Conjunctivae and EOM are normal. Pupils are equal, round, and reactive to light.  Cardiovascular: Normal rate, regular rhythm, normal heart sounds and intact distal pulses.   No murmur heard. Pulmonary/Chest: Effort normal and breath sounds normal. No respiratory distress. She has no wheezes. She has no rales.  Abdominal: Soft. Bowel sounds are normal. She exhibits no distension and no mass. There is no hepatosplenomegaly. There is generalized tenderness (mild). There is no rebound, no guarding, no CVA tenderness and negative Murphy's sign.  Musculoskeletal: She exhibits no edema.  Nursing note and vitals reviewed.  Results for orders placed or performed in visit on 0000000  Basic Metabolic Panel (BMET)  Result Value Ref Range   Glucose 139 (H) 65 - 99 mg/dL   BUN 40 (H) 8 - 27 mg/dL    Creatinine, Ser 1.57 (H) 0.57 - 1.00 mg/dL   GFR calc non Af Amer 33 (L) >59 mL/min/1.73   GFR calc Af Amer 38 (L) >59 mL/min/1.73   BUN/Creatinine Ratio 25 11 - 26   Sodium 139 134 - 144 mmol/L   Potassium 4.8 3.5 - 5.2 mmol/L   Chloride 95 (L) 96 - 106 mmol/L   CO2 20 18 - 29 mmol/L   Calcium 9.6 8.7 - 10.3 mg/dL   Lab Results  Component Value Date   WBC 10.0 07/12/2015   HGB 12.4 07/12/2015   HCT 37.2 07/12/2015   MCV 90.3 07/12/2015   PLT 282.0 07/12/2015       Assessment & Plan:   Problem List Items Addressed This Visit    Fibromyalgia    Contributes to chronic malaise/fatigue      Chronic fatigue and malaise    Longstanding, worsened since 2015 surgical procedures. Consider checking vitamin levels next labs.  CKD (chronic kidney disease) stage 3, GFR 30-59 ml/min    Recheck levels today with torsemide 10mg  on board.      Relevant Orders   Comprehensive metabolic panel   Essential hypertension    bp actually running low. On low dose entresto and carvedilol and spironolactone.       Nonischemic cardiomyopathy (HCC)    Decrease in EF after bronchitis vs prevnar related decline. Followed by cards, likely with rpt echo in upcoming months. Reviewed med regimen and recent calcium cardiac score of 0      Viral URI with cough    Still with residual dry cough. Completed levaquin and prednisone course last month.      Abdominal discomfort - Primary    ?IBS related. Benign exam Check labs today (lipase, CBC, CMP) in preparation for upcoming cruise.      Relevant Orders   Comprehensive metabolic panel   CBC with Differential/Platelet   Lipase   TMJ dysfunction    R>L on exam. Discussed eccentric exercises. Has mouthguard for night per dentist.      BRBPR (bright red blood per rectum)    Self limited - anticipate due to int hemorrhoid flare. Check CBC today.          Follow up plan: Return if symptoms worsen or fail to improve.  [name],  MD

## 2015-08-30 NOTE — Assessment & Plan Note (Signed)
bp actually running low. On low dose entresto and carvedilol and spironolactone.

## 2015-08-30 NOTE — Assessment & Plan Note (Signed)
Still with residual dry cough. Completed levaquin and prednisone course last month.

## 2015-08-30 NOTE — Patient Instructions (Addendum)
Labs today to see how kidneys and blood count are doing.  No changes to medicines today.  We will be in touch with lab results and plan for medicines.

## 2015-08-30 NOTE — Assessment & Plan Note (Signed)
Longstanding, worsened since 2015 surgical procedures. Consider checking vitamin levels next labs.

## 2015-08-30 NOTE — Progress Notes (Signed)
Pre visit review using our clinic review tool, if applicable. No additional management support is needed unless otherwise documented below in the visit note. 

## 2015-08-30 NOTE — Assessment & Plan Note (Signed)
Contributes to chronic malaise/fatigue

## 2015-08-30 NOTE — Assessment & Plan Note (Addendum)
?  IBS related. Benign exam Check labs today (lipase, CBC, CMP) in preparation for upcoming cruise.

## 2015-08-30 NOTE — Assessment & Plan Note (Signed)
Self limited - anticipate due to int hemorrhoid flare. Check CBC today.

## 2015-08-30 NOTE — Assessment & Plan Note (Signed)
R>L on exam. Discussed eccentric exercises. Has mouthguard for night per dentist.

## 2015-08-30 NOTE — Assessment & Plan Note (Signed)
Recheck levels today with torsemide 10mg  on board.

## 2015-08-31 ENCOUNTER — Telehealth: Payer: Self-pay

## 2015-08-31 MED ORDER — SCOPOLAMINE 1 MG/3DAYS TD PT72: 1.0000 | MEDICATED_PATCH | TRANSDERMAL | Status: DC

## 2015-08-31 NOTE — Telephone Encounter (Signed)
Ashley [name] at [facility]. Left v/m requesting refill transderm scope. Pt is going on cruise at end of this week; pt last seen 08/30/15; do not see on med list.

## 2015-08-31 NOTE — Telephone Encounter (Signed)
Sent in

## 2015-09-01 ENCOUNTER — Other Ambulatory Visit: Payer: Self-pay | Admitting: Ophthalmology

## 2015-09-01 DIAGNOSIS — R51 Headache: Secondary | ICD-10-CM | POA: Diagnosis not present

## 2015-09-01 DIAGNOSIS — R519 Headache, unspecified: Secondary | ICD-10-CM

## 2015-09-06 ENCOUNTER — Ambulatory Visit: Payer: Medicare Other | Admitting: Family Medicine

## 2015-09-15 ENCOUNTER — Ambulatory Visit: Payer: Medicare Other

## 2015-09-20 ENCOUNTER — Ambulatory Visit
Admission: RE | Admit: 2015-09-20 | Discharge: 2015-09-20 | Disposition: A | Discharge status: Home / Self Care | Payer: Medicare Other | Source: Ambulatory Visit | Attending: Ophthalmology | Admitting: Ophthalmology

## 2015-09-20 DIAGNOSIS — R51 Headache: Secondary | ICD-10-CM | POA: Insufficient documentation

## 2015-09-20 DIAGNOSIS — R519 Headache, unspecified: Secondary | ICD-10-CM

## 2015-09-23 ENCOUNTER — Ambulatory Visit: Payer: Medicare Other | Admitting: Cardiovascular Disease

## 2015-09-24 ENCOUNTER — Telehealth: Payer: Self-pay | Admitting: Cardiovascular Disease

## 2015-09-24 ENCOUNTER — Ambulatory Visit: Payer: Medicare Other | Admitting: Cardiovascular Disease

## 2015-09-24 NOTE — Telephone Encounter (Signed)
Pt has an appt w/ Dr [name] on Monday to discuss.

## 2015-09-24 NOTE — Telephone Encounter (Signed)
sacubitril-valsartan (ENTRESTO) 24-26 MG  Patient hasn't had any leg cramps since she stopped her Entresto two weeks ago. Please call Patient.

## 2015-09-26 ENCOUNTER — Other Ambulatory Visit: Payer: Self-pay | Admitting: Family Medicine

## 2015-09-27 ENCOUNTER — Encounter: Payer: Self-pay | Admitting: Cardiovascular Disease

## 2015-09-27 ENCOUNTER — Ambulatory Visit (INDEPENDENT_AMBULATORY_CARE_PROVIDER_SITE_OTHER): Payer: Medicare Other | Admitting: Cardiovascular Disease

## 2015-09-27 ENCOUNTER — Ambulatory Visit: Payer: Medicare Other | Admitting: Cardiovascular Disease

## 2015-09-27 VITALS — BP 124/68 | HR 92 | Ht 65.5 in | Wt 174.5 lb

## 2015-09-27 DIAGNOSIS — I10 Essential (primary) hypertension: Secondary | ICD-10-CM | POA: Diagnosis not present

## 2015-09-27 DIAGNOSIS — N179 Acute kidney failure, unspecified: Secondary | ICD-10-CM | POA: Diagnosis not present

## 2015-09-27 DIAGNOSIS — I5022 Chronic systolic (congestive) heart failure: Secondary | ICD-10-CM | POA: Diagnosis not present

## 2015-09-27 DIAGNOSIS — I255 Ischemic cardiomyopathy: Secondary | ICD-10-CM

## 2015-09-27 DIAGNOSIS — I420 Dilated cardiomyopathy: Secondary | ICD-10-CM

## 2015-09-27 DIAGNOSIS — I428 Other cardiomyopathies: Secondary | ICD-10-CM

## 2015-09-27 DIAGNOSIS — I429 Cardiomyopathy, unspecified: Secondary | ICD-10-CM | POA: Diagnosis not present

## 2015-09-27 MED ORDER — VALSARTAN 80 MG PO TABS: 80.0000 mg | ORAL_TABLET | Freq: Every day | ORAL | Status: DC

## 2015-09-27 NOTE — Patient Instructions (Signed)
You are doing well.  Please start diovan/valsartan 1/2 up to one pill a day This can make heart squeeze stronger  Please call us if you have new issues that need to be addressed before your next appt.  Your physician wants you to follow-up in: 3 months.  You will receive a reminder letter in the mail two months in advance. If you don't receive a letter, please call our office to schedule the follow-up appointment.

## 2015-09-27 NOTE — Assessment & Plan Note (Signed)
Blood pressure well controlled, we will add Diovan for heart failure, systolic dysfunction

## 2015-09-27 NOTE — Assessment & Plan Note (Signed)
Worsening renal function while on high-dose torsemide likely secondary to prerenal state. Now taking torsemide daily Recommended she try not to overdo her diuretic   Total encounter time more than 25 minutes  Greater than 50% was spent in counseling and coordination of care with the patient

## 2015-09-27 NOTE — Assessment & Plan Note (Signed)
Improved symptoms of shortness of breath, leg swelling since her last clinic visit Recently stopped the entresto for various reasons including fatigue, leg discomfort. She was worried about potassium levels, saw a commercial on TV. Recommended that she try low-dose dose ARB. She does not feel that she can take generic medication. Recommended that she try Diovan 40 mg daily for a week with slow titration up to 80 mg daily We'll continue carvedilol, Aldactone She is interested in repeat echocardiogram in the near future

## 2015-09-27 NOTE — Progress Notes (Signed)
Patient ID: Ashley [name], female    DOB: [birth year], 70 y.o.   MRN: [id number]  HPI Comments: 70 y/o female with history of fibromyalgia, irritable bowel, pneumonia, chronic cough felt secondary to GERD, abnormal PFTs in the past, shortness of breath dating back to September 2012 that presented acutely, CT scan showing progressive pleural effusion , echo  October 2012 showing Cardiomyopathy  ejection fraction estimated at 25-35%,  Echo September 2013 with ejection fraction 30-35%,  She had ICD placed Previous  Lexiscan Myoview  showed no significant ischemia, no EKG changes concerning for ischemia.  EF 50 to 55% in 02/2013,  12/15,  And November 2016  Echocardiogram February 23rd 2017 with ejection fraction 30-35% She presents for follow-up today for follow-up of her cardiomyopathy  previous abdominal surgery May 2015  On her last clinic visit we started her on entresto low-dose twice a day for systolic dysfunction. She took the medication until she ran out of the medication on her recent cruise 2-3 weeks ago Reports that she did not feel well on the cruise, slept many hours per day, had leg discomfort She stopped the medication then reports that she felt better when she got home. She does not want to retry the extresto. Also reports it was very expensive for her, $179 per month  Currently he is very busy, bought a new house, trying to get her house ready to put on the market Denies any shortness of breath, chest pain, leg discomfort, very active, feels more like herself.  She was taking to torsemide pills per day, down to 1 pill per day after lab work showed prerenal state She is currently taking carvedilol, Aldactone  EKG on today's visit shows normal sinus rhythm with rate 93 bpm, left bundle branch block     other past medical history  Recent stress test showed perfusion abnormality to the anterior wall, significant artifact , attenuation issue secondary to left bundle branch  block  Cardiogram with ejection fraction 30-35%  Possible severe reaction to pneumonia shot with what sounds like viral symptoms,  Requiring prednisone, antibiotics.   chronic abdominal pain, stress from her grandchildren. Previousid ultrasound showing minimal plaque on the left less than 39%, none on the right Previous dehydration with overuse of torsemide Eating more, no regular exercise, sedentary in general secondary to fibromyalgia and fatigue  Previous admission to [facility] in December 2012 for bronchitis and hypoxia as well as systolic CHF. She had antibiotics, prednisone and diuresis and has felt better since discharge. ACE inhibitor placed on hold secondary to chronic cough.  S/P ICD 10 days ago for EF <35%.  Goal weight had been in the low 150 range. Previous Cardiac MRI showed ejection fraction 34 %    ICD placed  by Dr. Lovena Le.  Previous symptoms of malaise and fatigue, tachycardia, weight gain, leg swelling . previously taking high doses of diuretics for weight gain causing renal dysfunction, dehydration. For creatinine 1.9 we suggested she cut back on her diuretics. Prior weight was in the low 160 pound range  History of diverticulosis, abscess formation, perforation requiring a long course of antibiotics with PICC line,  subsequent CT scan Sep 30 2013 showing free air, abscess requiring partial colectomy. She has an ostomy. Today she has lost 22 pounds by her report.   Previous echocardiogram shows improvement in her ejection fraction up to 50%. Challenging image quality. On my own review, it does appear at least 45, if not 50%.   Allergies  Allergen Reactions  . Antihistamines, Chlorpheniramine-Type Other (See Comments)    Makes her eyes look like she sees strobe lights  . Codeine Other (See Comments)    Confusion and dizziness  . Cyclobenzaprine Other (See Comments)    Adverse reaction - back and throat pain, dizziness, exhaustion  . Cymbalta [Duloxetine  Hcl] Other (See Comments)    "body spasms and made me feel weird in my head"   . Influenza Vac Split [Flu Virus Vaccine] Other (See Comments)    "allergic to concentrated eggs that are in vaccine"  . Penicillins Other (See Comments)    REACTION: "told 50 years ago I couldn't take it; might be immune to it", able to take amoxicillin  . Prevnar [Pneumococcal 13-Val Conj Vacc] Other (See Comments)  . Sulfasalazine Other (See Comments)    "makes my whole body smell like sulfa & makes me sick just smelling it"  . Atorvastatin Other (See Comments)    Severe muscle cramps to generic atorvastatin.  Able to take brand lipitor.  . Chlorhexidine Other (See Comments)    blisters  . Dilaudid [Hydromorphone Hcl] Other (See Comments)    Feels like she is burning internally   . Latex Nausea Only, Rash and Other (See Comments)    Pain and infection  . Morphine And Related Other (See Comments)    Internally feels like she is on fire   . Pravastatin Other (See Comments)    myalgias  . Prevacid [Lansoprazole] Other (See Comments)    Worsened GI side effects  . Red Dye Nausea Only and Swelling  . Rosuvastatin Other (See Comments)    Was not effective controlling lipids  . Simvastatin Other (See Comments)    Leg cramps  . Other     NOTE: pt is able to take cephalosporins without reaction  . Tape Other (See Comments)    All adhesives  - Blisters, itching and burning   . Metaxalone Other (See Comments)    REACTION: unknown    Outpatient Encounter Prescriptions as of 09/27/2015  Medication Sig  . acetaminophen (TYLENOL) 500 MG tablet Take 500 mg by mouth every 4 (four) hours as needed for mild pain or headache.   . alendronate (FOSAMAX) 70 MG tablet Take 70 mg by mouth once a week. Patient takes every Thursday  . ALPRAZolam (XANAX) 0.25 MG tablet TAKE 1 TABLET TWICE A DAY AS NEEDED  . Ascorbic Acid (VITAMIN C) 1000 MG tablet Take 1,000 mg by mouth daily.  Marland Kitchen aspirin EC 81 MG tablet Take 81 mg by  mouth daily.  . Calcium Carbonate-Vitamin D (CALCIUM + D PO) Take 1 tablet by mouth daily.  . carvedilol (COREG) 6.25 MG tablet Take 1 tablet (6.25 mg total) by mouth 2 (two) times daily with a meal.  . chlorpheniramine-HYDROcodone (TUSSIONEX PENNKINETIC ER) 10-8 MG/5ML SUER Take 5 mLs by mouth every 12 (twelve) hours as needed.  . Cholecalciferol (VITAMIN D3) 50000 units TABS Take 1 tablet by mouth once a week.  . Diphenhyd-Hydrocort-Nystatin (FIRST-DUKES MOUTHWASH) SUSP Swish 1-2 times daily as needed.  . ezetimibe (ZETIA) 10 MG tablet TAKE 1 TABLET (10 MG TOTAL) BY MOUTH DAILY.  Marland Kitchen Ferrous Sulfate ([employer] SLOW RELEASE IRON) 143 (45 Fe) MG TBCR Take 1 tablet by mouth daily.  Marland Kitchen ketoconazole (NIZORAL) 2 % cream Apply 1 application topically daily.  . mometasone (ELOCON) 0.1 % lotion Apply 1 application topically as needed (for eczema). Reported on 05/05/2015  . nystatin (MYCOSTATIN) 100000 UNIT/ML suspension Take 5  mLs (500,000 Units total) by mouth 3 (three) times daily as needed.  Marland Kitchen omeprazole (PRILOSEC) 40 MG capsule TAKE ONE CAPSULE BY MOUTH TWICE A DAY BEFORE MEALS  . polyethylene glycol (MIRALAX / GLYCOLAX) packet Take 17 g by mouth daily. Reported on 07/12/2015  . potassium chloride (K-DUR) 10 MEQ tablet Take 1 tablet (10 mEq total) by mouth daily as needed.  . predniSONE (DELTASONE) 50 MG tablet 1 tablet daily x 5 days.  . Probiotic Product (PROBIOTIC DAILY) CAPS Take 1 capsule by mouth daily.  . Pyridoxine HCl (VITAMIN B6 PO) Take 1 capsule by mouth daily. Reported on 07/12/2015  . scopolamine (TRANSDERM-SCOP) 1 MG/3DAYS Place 1 patch (1.5 mg total) onto the skin every 3 (three) days.  Marland Kitchen spironolactone (ALDACTONE) 12.5 mg TABS tablet Take 12.5 mg by mouth daily.  Marland Kitchen torsemide (DEMADEX) 10 MG tablet Take 1 tablet (10 mg total) by mouth daily as needed.  . triamcinolone (NASACORT) 55 MCG/ACT AERO nasal inhaler Place 2 sprays into the nose 2 (two) times daily.  . vitamin E 100 UNIT capsule Take  100 Units by mouth daily.  Marland Kitchen zolpidem (AMBIEN) 5 MG tablet TAKE 1/2 TO 1 TABLET AT BEDTIME AS NEEDED  . [DISCONTINUED] sacubitril-valsartan (ENTRESTO) 24-26 MG Take 1 tablet by mouth 2 (two) times daily.  . valsartan (DIOVAN) 80 MG tablet Take 1 tablet (80 mg total) by mouth daily.   No facility-administered encounter medications on file as of 09/27/2015.    Past Medical History  Diagnosis Date  . Migraine without aura, without mention of intractable migraine without mention of status migrainosus   . History of diverticulitis of colon   . Pure hypercholesterolemia   . Irritable bowel syndrome   . Laryngeal nodule     Dr. Thomasena Edis  . Fibrocystic breast disease   . Hemorrhoids   . Anxiety   . GERD (gastroesophageal reflux disease)   . Chronic systolic CHF (congestive heart failure) (HCC)     a. EF 25-35%, LV dilation, significant MR, goal weight 150lbs;  b. 04/2014 Echo: EF 50-55%, Gr 1 DD.  Marland Kitchen Nonischemic cardiomyopathy ([place])     a. Recovered LV fxn - prev as low as 25%, now 50-55% by echo 04/2014;  b. 2012 s/p MDT [id number] [place] VR single lead ICD.  Marland Kitchen History of pneumonia 2012  . Mitral regurgitation   . Anxiety   . Bronchiectasis     a. possibly mild, treat URIs aggressively  . Essential hypertension   . Chest pain     a. prev neg MV.  . Lung disease 07/14/11  . Anemia   . Fibromyalgia   . Depression   . Disorder of vocal cords   . Chronic sinusitis   . Insomnia   . Osteoporosis 01/2013  . sigmoid diverticulitis with perforation, abscess and fistula 09/30/2013  . H/O multiple allergies 10/10/2013  . PONV (postoperative nausea and vomiting)   . Pneumonia 2011  . Automatic implantable cardioverter-defibrillator in situ 2012    a. MDT single lead ICD.  Marland Kitchen Osteoarthritis     knees, back, hands, low back,   . Cancer (HCC)     basal cell, arms & neck  . Sleep apnea 2010    borderline, inconclusive- /w    . Hemorrhoids   . Diarrhea 07/14/2008    Qualifier: Diagnosis of   By: Glori Bickers MD, Carmell Austria   . Allergy   . Pre-syncope     Past Surgical History  Procedure Laterality Date  .  Appendectomy    . Mandible surgery      "got about 6 pins in the bottom of my jaw"  . Knee arthroscopy w/ orif      Left; "meniscus tear"  . Osteotomy      Left foot  . 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 Dexa  03/28/99    osteopenia L/S  . Colonoscopy  1998    nml (sigmoid-Gilbert-nol)  . Head mri, head ct  10/1998    ? H/A focus ([place])  . Emg/mcv  10/16/01    + mild carpal tunnel  . Dexa  03/12/03    2.2 Spine 0.1 Hip  Osteopenia  . Mri right hip  12/12/01    Tendonitis Gluteus Medius to Gtr Trochanter Neg Bursitis (Dr. Eulas Post)  . L/s films  11/21/01    nml; Right hip nml  . Mri u/s  11/21/01    min. DDD L/3-4  . Colonoscopy  01/04/04    polyps, diverticulosis/severe  . Dexa  11/14/05    1.9 Spine 0.3 Hip  slight improvement  . Flex laryngoscopy  06/11/06    ([place]) nml  . Dexa  11/22/06    improved osteo and fem neck  . [place] bunionectomy  11/04/08    Right Great Toe (Dr. Beola Cord)  . Breast mass excision  01/2010    Left-fibrocystic change w/intraductal papilloma, no malignancy  . Ct chest  12/2010    possible bronchiectasis lower lobes, 02/2011 - enlarged bilateral effusions, bibasilar atx  . Stress myoview  02/2011    no significant ischemia  . Ct chest  04/2011    no PE.  abnormal R hilar and mediastinal adenopathy --> on rpt 05/2011, resolved  . US echocardiography  04/2011    mildly dilated LV, EF 25-30%, hypokinesis, restrictive physiology, mod MR, no AS  . Icd placement  07/14/11    w/pacemaker  . Tonsillectomy and adenoidectomy    . Breast biopsy      "I've had 2-3 bx; all benign"  . Augmentation mammaplasty    . 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 Abdominal hysterectomy  1987    w/BSO  . Lasik      left eye  . Knee surgery      left  . Mandible surgery    . Sinus surgery      x3 with balloon    . Dexa  01/2013    T score -2.6 at spine  . Partial colectomy N/A 10/02/2013    PARTIAL COLECTOMY;  Surgeon: Imogene Burn. Georgette Dover, MD  . Colostomy N/A 10/02/2013    DESCENDING COLOSTOMY;  Surgeon: Imogene Burn. Georgette Dover, MD  . Colostomy reversal N/A 02/10/2014    [name], MD  . Implantable cardioverter defibrillator implant N/A 07/14/2011    IMPLANTABLE CARDIOVERTER DEFIBRILLATOR IMPLANT;  Surgeon: Evans Lance, MD;  Location: [facility] CATH LAB;  Service: Cardiovascular;  Laterality: N/A;  . Esophagogastroduodenoscopy  09/2014    small HH, irregular Z line, fundic gland polyps with mild gastritis ([place])  . Ncs  11/2014    L ulnar neuropathy, mild B median nerve entrapment ([place])  . Carotid US  [id number]    [id number] LICA, normal RICA    Social History  reports that she has never smoked. She has never used smokeless tobacco. She reports that she does not drink alcohol or use illicit drugs.  Family History  family history includes Alcohol abuse in her paternal grandfather; Arthritis in her mother; Colon polyps in her father; Dementia in her mother; Emphysema in her mother; Heart disease in her paternal grandfather and paternal grandmother; Hypertension in her maternal grandfather; Lung cancer in her father; Osteoporosis in her mother; Stroke in her mother and paternal grandfather. There is no history of Colon cancer, Esophageal cancer, or Gallbladder disease.       Review of Systems  Respiratory: Negative.   Cardiovascular: Negative.   Gastrointestinal: Negative.   Musculoskeletal: Positive for myalgias and arthralgias.  Skin: Negative.   Neurological: Negative.   Hematological: Negative.   Psychiatric/Behavioral: Negative.   All other systems reviewed and are negative.   BP 124/68 mmHg  Pulse 92  Ht 5' 5.5" (1.664 m)  Wt 174 lb 8 oz (79.153 kg)  BMI 28.59 kg/m2  Physical Exam  Constitutional: She is oriented to person, place, and time. She appears well-developed and well-nourished.   HENT:  Head: Normocephalic.  Nose: Nose normal.  Mouth/Throat: Oropharynx is clear and moist.  Eyes: Conjunctivae are normal. Pupils are equal, round, and reactive to light.  Neck: Normal range of motion. Neck supple. No JVD present.  Cardiovascular: Normal rate, regular rhythm, S1 normal, S2 normal, normal heart sounds and intact distal pulses.  Exam reveals no gallop and no friction rub.   No murmur heard. Pulmonary/Chest: Effort normal and breath sounds normal. No respiratory distress. She has no wheezes. She has no rales. She exhibits no tenderness.  Abdominal: Soft. Bowel sounds are normal. She exhibits no distension. There is no tenderness.  Musculoskeletal: Normal range of motion. She exhibits no edema or tenderness.  Lymphadenopathy:    She has no cervical adenopathy.  Neurological: She is alert and oriented to person, place, and time. Coordination normal.  Skin: Skin is warm and dry. No rash noted. No erythema.  Psychiatric: She has a normal mood and affect. Her behavior is normal. Judgment and thought content normal.    Assessment and Plan  Nursing note and vitals reviewed.

## 2015-10-02 IMAGING — CR DG CHEST 1V PORT
1 series · 1 of 1 positions shown · non-contrast
Comparison: 06/11/2012

CLINICAL DATA: Abdominal pain.  Diarrhea.

EXAM:
PORTABLE CHEST - 1 VIEW

[AP]
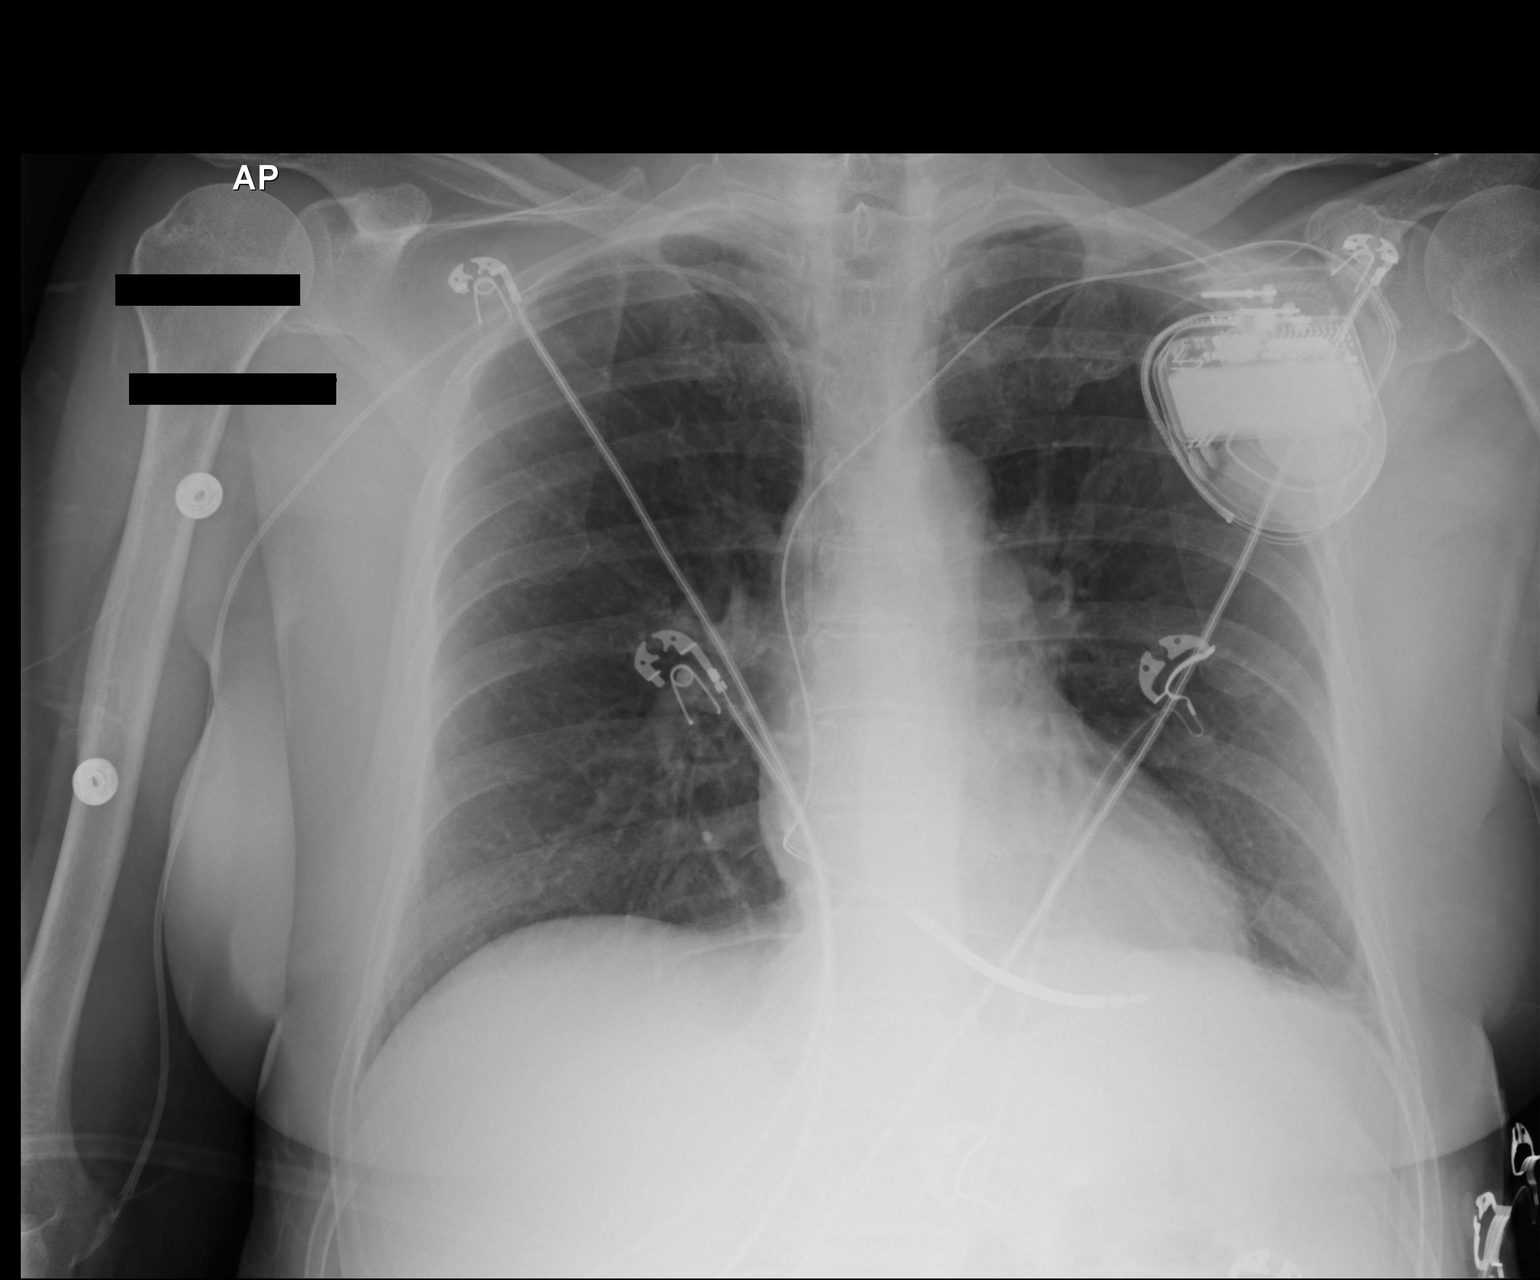

[1 of 1 positions shown; findings below may reference images not displayed]

FINDINGS: AICD in place. PICC in good position in the superior vena cava above
the cavoatrial junction 2 cm below the carina.

Heart size and pulmonary vascularity are normal. Lungs are clear. No
acute osseous abnormality.
IMPRESSION: No acute abnormalities.

## 2015-10-13 ENCOUNTER — Ambulatory Visit (INDEPENDENT_AMBULATORY_CARE_PROVIDER_SITE_OTHER): Payer: Medicare Other | Admitting: Family Medicine

## 2015-10-13 ENCOUNTER — Encounter: Payer: Self-pay | Admitting: Family Medicine

## 2015-10-13 VITALS — BP 124/76 | HR 73 | Temp 97.8°F | Wt 175.2 lb

## 2015-10-13 DIAGNOSIS — J019 Acute sinusitis, unspecified: Secondary | ICD-10-CM

## 2015-10-13 DIAGNOSIS — I255 Ischemic cardiomyopathy: Secondary | ICD-10-CM

## 2015-10-13 MED ORDER — DOXYCYCLINE HYCLATE 100 MG PO CAPS: 100.0000 mg | ORAL_CAPSULE | Freq: Two times a day (BID) | ORAL | Status: DC

## 2015-10-13 MED ORDER — PREDNISONE 20 MG PO TABS: 20.0000 mg | ORAL_TABLET | Freq: Every day | ORAL | Status: DC

## 2015-10-13 MED ORDER — HYDROCOD POLST-CPM POLST ER 10-8 MG/5ML PO SUER: 5.0000 mL | Freq: Every evening | ORAL | Status: DC | PRN

## 2015-10-13 NOTE — Assessment & Plan Note (Signed)
Anticipate viral given short duration - discussed this with patient.  rec supportive care, prescribed tussionex cough syrup, prednisone 20mg  course, and rec mucinex during the day. Complicated patient with h/o chronic sinusitis s/p multiple sinus surgeries - increases risk of ongoing bacterial sinusitis. Provided WASP for doxy with detailed indications of when to fill. Discussed risks of antibiotic overuse including bacterial resistance, abx side effects, etc. Discussed guidelines for acute sinusitis treatment. Discussed we need to give her body a chance to recover on its own. Pt agrees with trial conservative treatment for next week and will only fill abx if signs of bacterial infection develop.

## 2015-10-13 NOTE — Progress Notes (Signed)
BP 124/76 mmHg  Pulse 73  Temp(Src) 97.8 F (36.6 C) (Oral)  Wt 175 lb 4 oz (79.493 kg)  SpO2 97%   CC: ?sinusitis  Subjective:    Patient ID: Ashley [name], female    DOB: 11-Aug-1945, 70 y.o.   MRN: [id number]  HPI: DYLANEY CRISCIONE is a 70 y.o. female presenting on 10/13/2015 for Sinusitis   3d h/o coughing, sneezing, pressure headache, earache, fever initially now better, ST. Feels phlegm is thickening. Some throat swelling and trouble breathing.   She started prednisone she had at home - has taken 3 1/2 pills since Monday. Also using cold compresses and left over codeine cough syrup which didn't help.   No sick contacts at home. Not around smokers. No h/o asthma.   Last saw [facility] 2014 s/p sinus surgery for recurrent chronic sinusitis ([place]). This significantly decreased # of sinus infections.   Last abx course was levaquin 2 months ago.  She just took 2000mg  amoxicillin for recent tooth cleaning last week.   Relevant past medical, surgical, family and social history reviewed and updated as indicated. Interim medical history since our last visit reviewed. Allergies and medications reviewed and updated. Current Outpatient Prescriptions on File Prior to Visit  Medication Sig  . acetaminophen (TYLENOL) 500 MG tablet Take 500 mg by mouth every 4 (four) hours as needed for mild pain or headache.   . alendronate (FOSAMAX) 70 MG tablet Take 70 mg by mouth once a week. Patient takes every Thursday  . ALPRAZolam (XANAX) 0.25 MG tablet TAKE 1 TABLET TWICE A DAY AS NEEDED  . Ascorbic Acid (VITAMIN C) 1000 MG tablet Take 1,000 mg by mouth daily.  Marland Kitchen aspirin EC 81 MG tablet Take 81 mg by mouth daily.  . Calcium Carbonate-Vitamin D (CALCIUM + D PO) Take 1 tablet by mouth daily.  . carvedilol (COREG) 6.25 MG tablet Take 1 tablet (6.25 mg total) by mouth 2 (two) times daily with a meal.  . Cholecalciferol (VITAMIN D3) 50000 units TABS Take 1 tablet by mouth once a week.  .  Diphenhyd-Hydrocort-Nystatin (FIRST-DUKES MOUTHWASH) SUSP Swish 1-2 times daily as needed.  . ezetimibe (ZETIA) 10 MG tablet TAKE 1 TABLET (10 MG TOTAL) BY MOUTH DAILY.  Marland Kitchen Ferrous Sulfate ([employer] SLOW RELEASE IRON) 143 (45 Fe) MG TBCR Take 1 tablet by mouth daily.  Marland Kitchen ketoconazole (NIZORAL) 2 % cream Apply 1 application topically daily. (Patient taking differently: Apply 1 application topically daily as needed. )  . mometasone (ELOCON) 0.1 % lotion Apply 1 application topically as needed (for eczema). Reported on 05/05/2015  . omeprazole (PRILOSEC) 40 MG capsule TAKE ONE CAPSULE BY MOUTH TWICE A DAY BEFORE MEALS  . polyethylene glycol (MIRALAX / GLYCOLAX) packet Take 17 g by mouth daily as needed for mild constipation. Reported on 07/12/2015  . potassium chloride (K-DUR) 10 MEQ tablet Take 1 tablet (10 mEq total) by mouth daily as needed.  . Probiotic Product (PROBIOTIC DAILY) CAPS Take 1 capsule by mouth daily.  . Pyridoxine HCl (VITAMIN B6 PO) Take 1 capsule by mouth daily. Reported on 07/12/2015  . spironolactone (ALDACTONE) 12.5 mg TABS tablet Take 12.5 mg by mouth daily.  Marland Kitchen torsemide (DEMADEX) 10 MG tablet Take 1 tablet (10 mg total) by mouth daily as needed.  . triamcinolone (NASACORT) 55 MCG/ACT AERO nasal inhaler Place 2 sprays into the nose 2 (two) times daily.  . valsartan (DIOVAN) 80 MG tablet Take 1 tablet (80 mg total) by mouth daily.  Marland Kitchen  vitamin E 100 UNIT capsule Take 100 Units by mouth daily.  Marland Kitchen zolpidem (AMBIEN) 5 MG tablet TAKE 1/2 TO 1 TABLET AT BEDTIME AS NEEDED  . nystatin (MYCOSTATIN) 100000 UNIT/ML suspension Take 5 mLs (500,000 Units total) by mouth 3 (three) times daily as needed. (Patient not taking: Reported on 10/13/2015)  . scopolamine (TRANSDERM-SCOP) 1 MG/3DAYS Place 1 patch (1.5 mg total) onto the skin every 3 (three) days. (Patient not taking: Reported on 10/13/2015)  . [DISCONTINUED] bisoprolol (ZEBETA) 5 MG tablet Take 5 mg by mouth daily.   No current  facility-administered medications on file prior to visit.    Review of Systems Per HPI unless specifically indicated in ROS section     Objective:    BP 124/76 mmHg  Pulse 73  Temp(Src) 97.8 F (36.6 C) (Oral)  Wt 175 lb 4 oz (79.493 kg)  SpO2 97%  Wt Readings from Last 3 Encounters:  10/13/15 175 lb 4 oz (79.493 kg)  09/27/15 174 lb 8 oz (79.153 kg)  08/30/15 174 lb (78.926 kg)    Physical Exam  Constitutional: She appears well-developed and well-nourished. No distress.  HENT:  Head: Normocephalic and atraumatic.  Right Ear: Hearing, tympanic membrane, external ear and ear canal normal.  Left Ear: Hearing, tympanic membrane, external ear and ear canal normal.  Nose: Mucosal edema and rhinorrhea present. Right sinus exhibits maxillary sinus tenderness. Right sinus exhibits no frontal sinus tenderness. Left sinus exhibits maxillary sinus tenderness. Left sinus exhibits no frontal sinus tenderness.  Mouth/Throat: Uvula is midline and mucous membranes are normal. Posterior oropharyngeal erythema present. No oropharyngeal exudate, posterior oropharyngeal edema or tonsillar abscesses.  Marked nasal mucosal injection and erythema  Eyes: Conjunctivae and EOM are normal. Pupils are equal, round, and reactive to light. No scleral icterus.  Neck: Normal range of motion. Neck supple.  Cardiovascular: Normal rate, regular rhythm, normal heart sounds and intact distal pulses.   No murmur heard. Pulmonary/Chest: Effort normal and breath sounds normal. No respiratory distress. She has no wheezes. She has no rales.  Lymphadenopathy:    She has no cervical adenopathy.  Skin: Skin is warm and dry. No rash noted.  Nursing note and vitals reviewed.     Assessment & Plan:  Over 25 minutes were spent face-to-face with the patient during this encounter and >50% of that time was spent on counseling and coordination of care  Problem List Items Addressed This Visit    Acute sinusitis - Primary     Anticipate viral given short duration - discussed this with patient.  rec supportive care, prescribed tussionex cough syrup, prednisone 20mg  course, and rec mucinex during the day. Complicated patient with h/o chronic sinusitis s/p multiple sinus surgeries - increases risk of ongoing bacterial sinusitis. Provided WASP for doxy with detailed indications of when to fill. Discussed risks of antibiotic overuse including bacterial resistance, abx side effects, etc. Discussed guidelines for acute sinusitis treatment. Discussed we need to give her body a chance to recover on its own. Pt agrees with trial conservative treatment for next week and will only fill abx if signs of bacterial infection develop.       Relevant Medications   chlorpheniramine-HYDROcodone (TUSSIONEX PENNKINETIC ER) 10-8 MG/5ML SUER   predniSONE (DELTASONE) 20 MG tablet   doxycycline (VIBRAMYCIN) 100 MG capsule       Follow up plan: Return if symptoms worsen or fail to improve.  [name], MD

## 2015-10-13 NOTE — Patient Instructions (Signed)
You have a sinus infection. It may be viral and I do think we need to give your body a chance to surpass it on its own Take medicine as prescribed: supportive treatment with fluids, rest, tussionex cough syrup at night and mucinex during the day with big glass of water, prednisone anti inflammatory 20mg  once daily for 1 week. Nasal saline irrigation or neti pot to help drain sinuses. If fever >101, worsening after initial improvement, or ongoing symptoms past 7-10 days, fill doxycycline antibiotic provided today (caution with sunburns)

## 2015-10-13 NOTE — Progress Notes (Signed)
Pre visit review using our clinic review tool, if applicable. No additional management support is needed unless otherwise documented below in the visit note. 

## 2015-11-11 ENCOUNTER — Telehealth: Payer: Self-pay | Admitting: Cardiovascular Disease

## 2015-11-11 DIAGNOSIS — M1712 Unilateral primary osteoarthritis, left knee: Secondary | ICD-10-CM | POA: Diagnosis not present

## 2015-11-11 DIAGNOSIS — M25562 Pain in left knee: Secondary | ICD-10-CM | POA: Diagnosis not present

## 2015-11-11 NOTE — Telephone Encounter (Signed)
Spoke w/ pt.  She reports that she feels like she "is back in HF, I've got a cough, gag and stinging feeling occasionally under my pacemaker".  Reports that her wt is up 3 lbs to 170. Pt's chart indicates that she is to take torsemide 10 mg once daily prn for swelling, but she has been taking twice daily for several weeks. Pt states that if her husband is not present for all of her conversations, she gets confused about her med directions. Reviewed pt's med list w/ her and she reports that she takes all other meds as directed, including K+, carvedilol 6.25 mg BID, & spironolactone 12.5 mg daily. She was advised to take 1/2 valsartan 80 mg, then go up to a whole pill; she was unable to tolerate whole pill, she is only taking 1/2.  Pt drinks 8-10 oz of water each day, along w/ OJ, milk, and 2-3 Diet Cokes. Pt is concerned that she has been taking so much torsemide and that her sx have worsened. She is agreeable to coming in for labs, as she "does get a twinge near my kidneys, but it's not constant". Advised her that I will make Dr. [name] aware of her concerns & call her back w/ his recommendation.

## 2015-11-11 NOTE — Telephone Encounter (Signed)
Pt states she is in the middle of moving and noticed her SOB has increased. States he heart feels tight, she thinks it is her CHF. States some days her BP is 92/54/ The rest of the days it is 109/64. Pt c/o Shortness Of Breath: STAT if SOB developed within the last 24 hours or pt is noticeably SOB on the phone  1. Are you currently SOB (can you hear that pt is SOB on the phone)? no  2. How long have you been experiencing SOB? Last several weeks, more frequently the past 2days  3. Are you SOB when sitting or when up moving around? both  4. Are you currently experiencing any other symptoms? States she feels like she did when she was in CHF.

## 2015-11-12 ENCOUNTER — Telehealth: Payer: Self-pay | Admitting: Gastroenterology

## 2015-11-12 NOTE — Telephone Encounter (Signed)
Cannot have a daily bowel movment but if she takes 1/4 to 1/2 a capful of Miralax, she will get diarrhea. She cannot predict how her body will respond. Her abdomen is tender to very sore. She had bowel surgery 2 years ago. Ostomy take down after that. She is able to move stoll, but feels something may be wrong to be causing this difficulty with her bowel movements. Appointment scheduled. Urgent care or ER is she acutely worsens.

## 2015-11-12 NOTE — Telephone Encounter (Signed)
Would cut back on her fluids, Could increase torsemide up to 20 mgrams twice a day until weight comes down and she feels better

## 2015-11-12 NOTE — Telephone Encounter (Signed)
Spoke w/ pt.  Advised her of Dr. Donivan Scull recommendation.  She verbalizes understanding and will call back on Monday if her sx do not improve.

## 2015-11-13 DIAGNOSIS — I700 Atherosclerosis of aorta: Secondary | ICD-10-CM

## 2015-11-13 HISTORY — DX: Atherosclerosis of aorta: I70.0

## 2015-11-17 ENCOUNTER — Encounter: Payer: Self-pay | Admitting: Internal Medicine

## 2015-11-17 ENCOUNTER — Ambulatory Visit (INDEPENDENT_AMBULATORY_CARE_PROVIDER_SITE_OTHER): Payer: Medicare Other | Admitting: Internal Medicine

## 2015-11-17 VITALS — BP 108/68 | HR 79 | Ht 65.5 in | Wt 175.2 lb

## 2015-11-17 DIAGNOSIS — I5022 Chronic systolic (congestive) heart failure: Secondary | ICD-10-CM | POA: Diagnosis not present

## 2015-11-17 DIAGNOSIS — I255 Ischemic cardiomyopathy: Secondary | ICD-10-CM

## 2015-11-17 LAB — CUP PACEART INCLINIC DEVICE CHECK
Battery Voltage: 3.08 V
Brady Statistic RV Percent Paced: 0 %
Date Time Interrogation Session: 20170705122536
HighPow Impedance: 74 Ohm
Implantable Lead Implant Date: 20130301
Implantable Lead Location: 753860
Implantable Lead Model: 6935
Lead Channel Impedance Value: 532 Ohm
Lead Channel Pacing Threshold Amplitude: 0.625 V
Lead Channel Pacing Threshold Pulse Width: 0.4 ms
Lead Channel Sensing Intrinsic Amplitude: 9 mV
Lead Channel Setting Pacing Amplitude: 2.5 V
Lead Channel Setting Pacing Pulse Width: 0.4 ms
Lead Channel Setting Sensing Sensitivity: 0.3 mV

## 2015-11-17 NOTE — Progress Notes (Signed)
HPI Ashley [name] returns today for followup. She is a pleasant 70 yo woman with an DCM, chronic systolic CHF, s/p ICD implant.  She has had diverticulitis complicated by viscous perforation in the past. She denies fevers or chills. She still has multiple complaints today.  Allergies  Allergen Reactions  . Antihistamines, Chlorpheniramine-Type Other (See Comments)    Makes her eyes look like she sees strobe lights  . Codeine Other (See Comments)    Confusion and dizziness  . Cyclobenzaprine Other (See Comments)    Adverse reaction - back and throat pain, dizziness, exhaustion  . Cymbalta [Duloxetine Hcl] Other (See Comments)    "body spasms and made me feel weird in my head"   . Influenza Vac Split [Flu Virus Vaccine] Other (See Comments)    "allergic to concentrated eggs that are in vaccine"  . Penicillins Other (See Comments)    REACTION: "told 50 years ago I couldn't take it; might be immune to it", able to take amoxicillin  . Prevnar [Pneumococcal 13-Val Conj Vacc] Other (See Comments)    Egg allergy  . Sulfasalazine Other (See Comments)    "makes my whole body smell like sulfa & makes me sick just smelling it"  . Atorvastatin Other (See Comments)    Severe muscle cramps to generic atorvastatin.  Able to take brand lipitor.  . Chlorhexidine Other (See Comments)    blisters  . Dilaudid [Hydromorphone Hcl] Other (See Comments)    Feels like she is burning internally   . Latex Nausea Only, Rash and Other (See Comments)    Pain and infection  . Morphine And Related Other (See Comments)    Internally feels like she is on fire   . Pravastatin Other (See Comments)    myalgias  . Prevacid [Lansoprazole] Other (See Comments)    Worsened GI side effects  . Red Dye Nausea Only and Swelling  . Rosuvastatin Other (See Comments)    Was not effective controlling lipids  . Simvastatin Other (See Comments)    Leg cramps  . Other     NOTE: pt is able to take cephalosporins without reaction   . Tape Other (See Comments)    All adhesives  - Blisters, itching and burning   . Metaxalone Other (See Comments)    REACTION: unknown     Current Outpatient Prescriptions  Medication Sig Dispense Refill  . alendronate (FOSAMAX) 70 MG tablet Take 70 mg by mouth once a week. Patient takes every Thursday    . Ascorbic Acid (VITAMIN C) 1000 MG tablet Take 1,000 mg by mouth daily.    Marland Kitchen aspirin EC 81 MG tablet Take 81 mg by mouth daily.    . Calcium Carbonate-Vitamin D (CALCIUM + D PO) Take 1 tablet by mouth daily.    . carvedilol (COREG) 6.25 MG tablet Take 1 tablet (6.25 mg total) by mouth 2 (two) times daily with a meal. 180 tablet 4  . dextromethorphan (DELSYM) 30 MG/5ML liquid Take 15 mg by mouth at bedtime as needed for cough.    . Diphenhyd-Hydrocort-Nystatin (FIRST-DUKES MOUTHWASH) SUSP Use as directed in the mouth or throat. Swish 1-2 times daily as needed    . doxycycline (DORYX) 100 MG EC tablet Take 50 mg by mouth daily.    Marland Kitchen ezetimibe (ZETIA) 10 MG tablet TAKE 1 TABLET (10 MG TOTAL) BY MOUTH DAILY. 30 tablet 6  . Ferrous Sulfate 143 (45 Fe) MG TBCR Take 1 tablet by mouth every other day.    Marland Kitchen  omeprazole (PRILOSEC) 40 MG capsule TAKE ONE CAPSULE BY MOUTH TWICE A DAY BEFORE MEALS 60 capsule 11  . polyethylene glycol (MIRALAX / GLYCOLAX) packet Take 8.5 g by mouth daily as needed for mild constipation (rarely uses). Reported on 07/12/2015    . potassium chloride (K-DUR) 10 MEQ tablet Take 10 mEq by mouth as directed. Take daily as needed    . Probiotic Product (PROBIOTIC DAILY) CAPS Take 1 capsule by mouth daily.    . Pyridoxine HCl (VITAMIN B6 PO) Take 1 capsule by mouth daily. Reported on 07/12/2015    . spironolactone (ALDACTONE) 12.5 mg TABS tablet Take 12.5 mg by mouth daily.    Marland Kitchen torsemide (DEMADEX) 10 MG tablet Take 10 mg by mouth daily as needed (swelling/fluid).    . triamcinolone (NASACORT) 55 MCG/ACT AERO nasal inhaler Place 2 sprays into the nose 2 (two) times daily.    .  valsartan (DIOVAN) 80 MG tablet Take 1 tablet (80 mg total) by mouth daily. 30 tablet 11  . vitamin E 100 UNIT capsule Take 100 Units by mouth daily.    Marland Kitchen acetaminophen (TYLENOL) 500 MG tablet Take 500 mg by mouth every 4 (four) hours as needed for mild pain or headache. Reported on 11/17/2015    . ALPRAZolam (XANAX) 0.25 MG tablet Take 0.25 mg by mouth 2 (two) times daily as needed for anxiety. Reported on 11/17/2015    . mometasone (ELOCON) 0.1 % lotion Apply 1 application topically as directed. Reported on 11/17/2015    . zolpidem (AMBIEN) 5 MG tablet Reported on 11/17/2015    . [DISCONTINUED] bisoprolol (ZEBETA) 5 MG tablet Take 5 mg by mouth daily.     No current facility-administered medications for this visit.     Past Medical History  Diagnosis Date  . Migraine without aura, without mention of intractable migraine without mention of status migrainosus   . History of diverticulitis of colon   . Pure hypercholesterolemia   . Irritable bowel syndrome   . Laryngeal nodule     Dr. Thomasena Edis  . Fibrocystic breast disease   . Hemorrhoids   . Anxiety   . GERD (gastroesophageal reflux disease)   . Chronic systolic CHF (congestive heart failure) (HCC)     a. EF 25-35%, LV dilation, significant MR, goal weight 150lbs;  b. 04/2014 Echo: EF 50-55%, Gr 1 DD.  Marland Kitchen Nonischemic cardiomyopathy ([place])     a. Recovered LV fxn - prev as low as 25%, now 50-55% by echo 04/2014;  b. 2012 s/p MDT [id number] [place] VR single lead ICD.  Marland Kitchen History of pneumonia 2012  . Mitral regurgitation   . Anxiety   . Bronchiectasis     a. possibly mild, treat URIs aggressively  . Essential hypertension   . Chest pain     a. prev neg MV.  . Lung disease 07/14/11  . Anemia   . Fibromyalgia   . Depression   . Disorder of vocal cords   . Chronic sinusitis   . Insomnia   . Osteoporosis 01/2013  . sigmoid diverticulitis with perforation, abscess and fistula 09/30/2013  . H/O multiple allergies 10/10/2013  . PONV (postoperative  nausea and vomiting)   . Pneumonia 2011  . Automatic implantable cardioverter-defibrillator in situ 2012    a. MDT single lead ICD.  Marland Kitchen Osteoarthritis     knees, back, hands, low back,   . Cancer (HCC)     basal cell, arms & neck  . Sleep apnea 2010  borderline, inconclusive- /w [place]   . Hemorrhoids   . Diarrhea 07/14/2008    Qualifier: Diagnosis of  By: Glori Bickers MD, Carmell Austria   . Allergy   . Pre-syncope     ROS:   All systems reviewed and negative except as noted in the HPI.   Past Surgical History  Procedure Laterality Date  . Appendectomy    . Mandible surgery      "got about 6 pins in the bottom of my jaw"  . Knee arthroscopy w/ orif      Left; "meniscus tear"  . Osteotomy      Left foot  . 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 Dexa  03/28/99    osteopenia L/S  . Colonoscopy  1998    nml (sigmoid-Gilbert-nol)  . Head mri, head ct  10/1998    ? H/A focus ([place])  . Emg/mcv  10/16/01    + mild carpal tunnel  . Dexa  03/12/03    2.2 Spine 0.1 Hip  Osteopenia  . Mri right hip  12/12/01    Tendonitis Gluteus Medius to Gtr Trochanter Neg Bursitis (Dr. Eulas Post)  . L/s films  11/21/01    nml; Right hip nml  . Mri u/s  11/21/01    min. DDD L/3-4  . Colonoscopy  01/04/04    polyps, diverticulosis/severe  . Dexa  11/14/05    1.9 Spine 0.3 Hip  slight improvement  . Flex laryngoscopy  06/11/06    ([place]) nml  . Dexa  11/22/06    improved osteo and fem neck  . [place] bunionectomy  11/04/08    Right Great Toe (Dr. Beola Cord)  . Breast mass excision  01/2010    Left-fibrocystic change w/intraductal papilloma, no malignancy  . Ct chest  12/2010    possible bronchiectasis lower lobes, 02/2011 - enlarged bilateral effusions, bibasilar atx  . Stress myoview  02/2011    no significant ischemia  . Ct chest  04/2011    no PE.  abnormal R hilar and mediastinal adenopathy --> on rpt 05/2011, resolved  . US echocardiography  04/2011    mildly dilated LV, EF 25-30%, hypokinesis,  restrictive physiology, mod MR, no AS  . Icd placement  07/14/11    w/pacemaker  . Tonsillectomy and adenoidectomy    . Breast biopsy      "I've had 2-3 bx; all benign"  . Augmentation mammaplasty    . 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 Abdominal hysterectomy  1987    w/BSO  . Lasik      left eye  . Knee surgery      left  . Mandible surgery    . Sinus surgery      x3 with balloon   . Dexa  01/2013    T score -2.6 at spine  . Partial colectomy N/A 10/02/2013    PARTIAL COLECTOMY;  Surgeon: Imogene Burn. Georgette Dover, MD  . Colostomy N/A 10/02/2013    DESCENDING COLOSTOMY;  Surgeon: Imogene Burn. Georgette Dover, MD  . Colostomy reversal N/A 02/10/2014    [name], MD  . Implantable cardioverter defibrillator implant N/A 07/14/2011    IMPLANTABLE CARDIOVERTER DEFIBRILLATOR IMPLANT;  Surgeon: Evans Lance, MD;  Location: [facility] CATH LAB;  Service: Cardiovascular;  Laterality: N/A;  . Esophagogastroduodenoscopy  09/2014    small HH, irregular Z line, fundic gland polyps with mild gastritis ([place])  .  Ncs  11/2014    L ulnar neuropathy, mild B median nerve entrapment ([place])  . Carotid US  [id number]    [id number] LICA, normal RICA     Family History  Problem Relation Age of Onset  . Lung cancer Father     + smoker  . Dementia Mother   . Osteoporosis Mother     Lumbar spine  . Stroke Mother     x 5 @ 83 YOA  . Arthritis Mother     hands  . Emphysema Mother   . Alcohol abuse Paternal Grandfather   . Hypertension Maternal Grandfather     ?  . Stroke Paternal Grandfather     ?  Marland Kitchen Heart disease Paternal Grandmother   . Heart disease Paternal Grandfather   . Colon polyps Father   . Colon cancer Neg Hx   . Esophageal cancer Neg Hx   . Gallbladder disease Neg Hx      Social History   Social History  . Marital Status: Married    Spouse Name: N/A  . Number of Children: 1  . Years of Education: N/A   Occupational History  . 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 [employer]    Social History Main Topics  . Smoking status: Never Smoker   . Smokeless tobacco: Never Used     Comment: Lived with smokers x 23 yrs, smoked herself "for a week"  . Alcohol Use: No  . Drug Use: No  . Sexual Activity: Not Currently   Other Topics Concern  . Not on file   Social History Narrative   1 adopted child      Lives with husband              Ht 5' 5.5" (1.664 m)  Wt 175 lb 3.2 oz (79.47 kg)  BMI 28.70 kg/m2  Physical Exam:  Chronically ill appearing 70 yo woman, NAD HEENT: Unremarkable Neck:  6 cm JVD, no thyromegally Back:  No CVA tenderness Lungs:  Clear with no wheezes, rales, or rhonchi. HEART:  Regular rate rhythm, no murmurs, no rubs, no clicks Abd:  soft, positive bowel sounds, no organomegally, no rebound, no guarding, colostomy in place Ext:  2 plus pulses, no edema, no cyanosis, no clubbing Skin:  No rashes no nodules Neuro:  CN II through XII intact, motor grossly intact  DEVICE  Normal device function.  See PaceArt for details.   Assess/Plan: 1. Chronic systolic heart failure - her optivol is stable and her lungs are clear. I have recommended she continue her current meds and maintain a low sodium diet. 2. ICD - her Medronic device is working normally. Will recheck in several months.  3. Chest pain - she has pain at the base of her ICD insertion site which is reproducible. There are no skin changes. I have recommended watchful waiting.   Mikle Bosworth.D.

## 2015-11-17 NOTE — Patient Instructions (Signed)
Medication Instructions:  Your physician recommends that you continue on your current medications as directed. Please refer to the Current Medication list given to you today.  Labwork: None ordered  Testing/Procedures: None ordered  Follow-Up: Remote monitoring is used to monitor your Pacemaker of ICD from home. This monitoring reduces the number of office visits required to check your device to one time per year. It allows [place] to keep an eye on the functioning of your device to ensure it is working properly. You are scheduled for a device check from home on 02/16/2016. You may send your transmission at any time that day. If you have a wireless device, the transmission will be sent automatically. After your physician reviews your transmission, you will receive a postcard with your next transmission date.  Your physician wants you to follow-up in: 1 year with Dr. Lovena Le.  You will receive a reminder letter in the mail two months in advance. If you don't receive a letter, please call our office to schedule the follow-up appointment.  If you need a refill on your cardiac medications before your next appointment, please call your pharmacy.  Thank you for choosing [facility]!!

## 2015-11-19 DIAGNOSIS — L738 Other specified follicular disorders: Secondary | ICD-10-CM | POA: Diagnosis not present

## 2015-11-19 DIAGNOSIS — L821 Other seborrheic keratosis: Secondary | ICD-10-CM | POA: Diagnosis not present

## 2015-11-19 DIAGNOSIS — D485 Neoplasm of uncertain behavior of skin: Secondary | ICD-10-CM | POA: Diagnosis not present

## 2015-11-19 DIAGNOSIS — L304 Erythema intertrigo: Secondary | ICD-10-CM | POA: Diagnosis not present

## 2015-11-19 DIAGNOSIS — L570 Actinic keratosis: Secondary | ICD-10-CM | POA: Diagnosis not present

## 2015-11-19 DIAGNOSIS — L218 Other seborrheic dermatitis: Secondary | ICD-10-CM | POA: Diagnosis not present

## 2015-11-19 DIAGNOSIS — M180 Bilateral primary osteoarthritis of first carpometacarpal joints: Secondary | ICD-10-CM | POA: Diagnosis not present

## 2015-11-19 DIAGNOSIS — L03019 Cellulitis of unspecified finger: Secondary | ICD-10-CM | POA: Diagnosis not present

## 2015-11-22 ENCOUNTER — Encounter: Payer: Self-pay | Admitting: Gastroenterology

## 2015-11-22 ENCOUNTER — Ambulatory Visit (INDEPENDENT_AMBULATORY_CARE_PROVIDER_SITE_OTHER): Payer: Medicare Other | Admitting: Gastroenterology

## 2015-11-22 ENCOUNTER — Other Ambulatory Visit (INDEPENDENT_AMBULATORY_CARE_PROVIDER_SITE_OTHER): Payer: Medicare Other

## 2015-11-22 VITALS — BP 112/72 | HR 80 | Ht 65.5 in | Wt 172.0 lb

## 2015-11-22 DIAGNOSIS — R109 Unspecified abdominal pain: Secondary | ICD-10-CM | POA: Diagnosis not present

## 2015-11-22 DIAGNOSIS — R194 Change in bowel habit: Secondary | ICD-10-CM

## 2015-11-22 DIAGNOSIS — R1084 Generalized abdominal pain: Secondary | ICD-10-CM

## 2015-11-22 DIAGNOSIS — I255 Ischemic cardiomyopathy: Secondary | ICD-10-CM

## 2015-11-22 DIAGNOSIS — Z8719 Personal history of other diseases of the digestive system: Secondary | ICD-10-CM | POA: Diagnosis not present

## 2015-11-22 DIAGNOSIS — G8929 Other chronic pain: Secondary | ICD-10-CM | POA: Insufficient documentation

## 2015-11-22 LAB — BASIC METABOLIC PANEL
BUN: 33 mg/dL — ABNORMAL HIGH (ref 6–23)
CO2: 29 mEq/L (ref 19–32)
Calcium: 10.1 mg/dL (ref 8.4–10.5)
Chloride: 101 mEq/L (ref 96–112)
Creatinine, Ser: 1.28 mg/dL — ABNORMAL HIGH (ref 0.40–1.20)
GFR: 43.77 mL/min — ABNORMAL LOW (ref >60.00)
Glucose, Bld: 88 mg/dL (ref 70–99)
Potassium: 4.3 mEq/L (ref 3.5–5.1)
Sodium: 138 mEq/L (ref 135–145)

## 2015-11-22 MED ORDER — RIFAXIMIN 550 MG PO TABS
550.0000 mg | ORAL_TABLET | Freq: Three times a day (TID) | ORAL | Status: DC
Start: 2015-11-22 — End: 2016-01-12

## 2015-11-22 NOTE — Progress Notes (Signed)
     11/22/2015 Ashley [name] [id number] [birth year]   History of Present Illness:  This is a 70 year old female with history of diverticulosis status post perforated diverticulitis in May 2015 requiring laparotomy and colostomy with reversal in September 2015. She had dehiscence of both of her incisions.  She was seen by Dr. Silverio Decamp in follow-up in November 2016 at which time she declined colonoscopy.  Cologuard study was negative, however.  She presents to our office today complaining of alternating between constipation and diarrhea along with significant amount gas and bloating since her colon surgeries. She also complains of hardening and distention on the left side of her abdomen that has been occurring intermittently since that time as well. She says that when he gets very hard also gets very warm to the touch. She denies any fevers or chills, nausea or vomiting. This resolves on its own until it occurs next time. She tells me that for her alternating bowel habit she has tried combination of Metamucil and MiraLAX, but that did not seem to help. She is on a daily probiotic.  She has many medication allergies/intolerances.  Current Medications, Allergies, Past Medical History, Past Surgical History, Family History and Social History were reviewed in [employer] record.   Physical Exam: BP 112/72 mmHg  Pulse 80  Ht 5' 5.5" (1.664 m)  Wt 172 lb (78.019 kg)  BMI 28.18 kg/m2 General: Well developed white female in no acute distress Head: Normocephalic and atraumatic Eyes:  Sclerae anicteric, conjunctiva pink  Ears: Normal auditory acuity Lungs: Clear throughout to auscultation Heart: Regular rate and rhythm Abdomen: Soft, non-distended.  Normal bowel sounds.  Large incisional scar noted from previous dehiscence.  Left sided TTP. Musculoskeletal: Symmetrical with no gross deformities  Extremities: No edema  Neurological: Alert oriented x 4, grossly  non-focal Psychological:  Alert and cooperative. Normal mood and affect  Assessment and Recommendations: -70 year-old female with history of diverticulitis complicated by perforation/abscess requiring surgery 2 years ago. Complains of alternating bowel habits, gas/bloating, and left-sided abdominal pain since that time. She likely has scar tissue/adhesions from her extensive surgeries and may have a hernia associated with this as well. She likely has some bowel changes affected by her change in anatomy. We will check a CT scan of the abdomen and pelvis with contrast. We will empirically try a course of Xifaxan 550 mg 3 times a day for 2 weeks. If this is too expensive than we could try a 7-10 day course of Flagyl instead.  Follow-up with Dr. Silverio Decamp in 6-8 weeks.

## 2015-11-22 NOTE — Patient Instructions (Addendum)
Please go to the basement level to have your labs drawn.  We sent a prescription to [facility] for the Xifaxan 550 mg.  You will get a call from them.     You have been scheduled for a CT scan of the abdomen and pelvis at [facility] ([address]---this is in the same building as Press [facility]).   You are scheduled on *Wednesday 7-12-2017at 2:30 PM. You should arrive at 2:15  to your appointment time for registration. Please follow the written instructions below on the day of your exam:  WARNING: IF YOU ARE ALLERGIC TO IODINE/X-RAY DYE, PLEASE NOTIFY RADIOLOGY IMMEDIATELY AT [phone]! YOU WILL BE GIVEN A 13 HOUR PREMEDICATION PREP.  1) Do not eat or drink anything after 10:30 am (4 hours prior to your test) 2) You have been given 2 bottles of oral contrast to drink. The solution may taste better if refrigerated, but do NOT add ice or any other liquid to this solution. Shake well before drinking.    Drink 1 bottle of contrast @ 12:30 PM (2 hours prior to your exam)  Drink 1 bottle of contrast @ 1:30 PM (1 hour prior to your exam)  You may take any medications as prescribed with a small amount of water except for the following: Metformin, Glucophage, Glucovance, Avandamet, Riomet, Fortamet, Actoplus Met, Janumet, Glumetza or Metaglip. The above medications must be held the day of the exam AND 48 hours after the exam.  The purpose of you drinking the oral contrast is to aid in the visualization of your intestinal tract. The contrast solution may cause some diarrhea. Before your exam is started, you will be given a small amount of fluid to drink. Depending on your individual set of symptoms, you may also receive an intravenous injection of x-ray contrast/dye. Plan on being at [facility] for 30 minutes or long, depending on the type of exam you are having performed.  If you have any questions regarding your exam or if you need to reschedule, you may call the CT  department at [phone] between the hours of 8:00 am and 5:00 pm, Monday-Friday.  ________________________________________________________________________  We have sent your demographic information and a prescription for Xifaxan to [facility]. This pharmacy is able to get medication approved through insurance and get you the lowest copay possible. If you have not heard from them within 1 week, please call our office at [phone] to let us know.

## 2015-11-23 NOTE — Progress Notes (Signed)
Reviewed and agree with documentation and assessment and plan. K. Veena Urie Loughner , MD   

## 2015-11-24 ENCOUNTER — Ambulatory Visit (INDEPENDENT_AMBULATORY_CARE_PROVIDER_SITE_OTHER)
Admission: RE | Admit: 2015-11-24 | Discharge: 2015-11-24 | Disposition: A | Discharge status: Home / Self Care | Payer: Medicare Other | Source: Ambulatory Visit | Attending: Gastroenterology | Admitting: Gastroenterology

## 2015-11-24 DIAGNOSIS — R109 Unspecified abdominal pain: Secondary | ICD-10-CM

## 2015-11-24 DIAGNOSIS — K5730 Diverticulosis of large intestine without perforation or abscess without bleeding: Secondary | ICD-10-CM | POA: Diagnosis not present

## 2015-11-24 DIAGNOSIS — Z8719 Personal history of other diseases of the digestive system: Secondary | ICD-10-CM

## 2015-11-24 DIAGNOSIS — R194 Change in bowel habit: Secondary | ICD-10-CM | POA: Diagnosis not present

## 2015-11-24 MED ORDER — IOPAMIDOL (ISOVUE-300) INJECTION 61%
80.0000 mL | Freq: Once | INTRAVENOUS | Status: AC | PRN
  Administered 2015-11-24: 80 mL via INTRAVENOUS

## 2015-11-30 ENCOUNTER — Telehealth: Payer: Self-pay | Admitting: *Deleted

## 2015-11-30 NOTE — Telephone Encounter (Signed)
CT results sent to Dr. Danise Mina via [facility].

## 2015-11-30 NOTE — Telephone Encounter (Signed)
-----   Message from Loralie Champagne, PA-C sent at 11/30/2015 10:13 AM EDT ----- I sent you a message on her CT scan earlier. Please also fax or forward her CT scan results to her PCP.  Thank you,  Jess

## 2015-12-01 ENCOUNTER — Encounter: Payer: Self-pay | Admitting: Family Medicine

## 2015-12-01 DIAGNOSIS — S99822A Other specified injuries of left foot, initial encounter: Secondary | ICD-10-CM | POA: Diagnosis not present

## 2015-12-01 DIAGNOSIS — S9032XA Contusion of left foot, initial encounter: Secondary | ICD-10-CM | POA: Diagnosis not present

## 2015-12-03 ENCOUNTER — Telehealth: Payer: Self-pay | Admitting: *Deleted

## 2015-12-03 NOTE — Telephone Encounter (Signed)
LM for the patient with details regarding her zero copay for the Xifaxan 550 mg from [facility]. Left their number for her to call so they can deliver her medication.  Also LM for her husband, [phone] and her son Herbie [place], [phone]. Was not able to talk to anyone.

## 2015-12-15 DIAGNOSIS — S92515D Nondisplaced fracture of proximal phalanx of left lesser toe(s), subsequent encounter for fracture with routine healing: Secondary | ICD-10-CM | POA: Diagnosis not present

## 2015-12-15 DIAGNOSIS — S9032XD Contusion of left foot, subsequent encounter: Secondary | ICD-10-CM | POA: Diagnosis not present

## 2015-12-15 DIAGNOSIS — M7989 Other specified soft tissue disorders: Secondary | ICD-10-CM | POA: Diagnosis not present

## 2015-12-28 DIAGNOSIS — M79672 Pain in left foot: Secondary | ICD-10-CM | POA: Diagnosis not present

## 2015-12-28 DIAGNOSIS — M79671 Pain in right foot: Secondary | ICD-10-CM | POA: Diagnosis not present

## 2015-12-28 DIAGNOSIS — G5762 Lesion of plantar nerve, left lower limb: Secondary | ICD-10-CM | POA: Diagnosis not present

## 2015-12-28 DIAGNOSIS — G5761 Lesion of plantar nerve, right lower limb: Secondary | ICD-10-CM | POA: Diagnosis not present

## 2015-12-28 DIAGNOSIS — M7742 Metatarsalgia, left foot: Secondary | ICD-10-CM | POA: Diagnosis not present

## 2016-01-12 ENCOUNTER — Ambulatory Visit (INDEPENDENT_AMBULATORY_CARE_PROVIDER_SITE_OTHER): Payer: Medicare Other | Admitting: Cardiovascular Disease

## 2016-01-12 ENCOUNTER — Encounter: Payer: Self-pay | Admitting: Cardiovascular Disease

## 2016-01-12 ENCOUNTER — Ambulatory Visit: Payer: Medicare Other | Admitting: Cardiovascular Disease

## 2016-01-12 VITALS — BP 108/60 | HR 74 | Ht 65.5 in | Wt 171.5 lb

## 2016-01-12 DIAGNOSIS — I700 Atherosclerosis of aorta: Secondary | ICD-10-CM

## 2016-01-12 DIAGNOSIS — I429 Cardiomyopathy, unspecified: Secondary | ICD-10-CM

## 2016-01-12 DIAGNOSIS — I5022 Chronic systolic (congestive) heart failure: Secondary | ICD-10-CM | POA: Diagnosis not present

## 2016-01-12 DIAGNOSIS — R0602 Shortness of breath: Secondary | ICD-10-CM

## 2016-01-12 DIAGNOSIS — I340 Nonrheumatic mitral (valve) insufficiency: Secondary | ICD-10-CM | POA: Diagnosis not present

## 2016-01-12 DIAGNOSIS — R079 Chest pain, unspecified: Secondary | ICD-10-CM

## 2016-01-12 DIAGNOSIS — I420 Dilated cardiomyopathy: Secondary | ICD-10-CM

## 2016-01-12 DIAGNOSIS — I255 Ischemic cardiomyopathy: Secondary | ICD-10-CM

## 2016-01-12 NOTE — Progress Notes (Signed)
Cardiology Office Note  Date:  01/12/2016   ID:  Ashley [name], DOB Dec 05, 1945, MRN [id number]  PCP:  [name], MD   Chief Complaint  Patient presents with  . Other    6 month follow up. Meds reviewed by the patient verbally. Pt. c/o chest pain, very tired and shortness of breath.     HPI:  70 y/o female with history of fibromyalgia, irritable bowel, pneumonia, chronic cough felt secondary to GERD, abnormal PFTs in the past, shortness of breath dating back to September 2012 that presented acutely, CT scan showing progressive pleural effusion , echo  October 2012 showing Cardiomyopathy  ejection fraction estimated at 25-35%,  Echo September 2013 with ejection fraction 30-35%,  She had ICD placed Previous  Lexiscan Myoview  showed no significant ischemia, no EKG changes concerning for ischemia.  EF 50 to 55% in 02/2013,  12/15,  And November 2016 She received pneumonia vaccine January 2017, had severe reaction Echocardiogram February 23rd 2017 with ejection fraction 30-35% She presents for follow-up today for follow-up of her cardiomyopathy  previous abdominal surgery May 2015  In follow-up today, she reports having continued SOB, malaise She is tolerating Diovan. Did not tolerate entresto (" too expensive") Gi problems, cysts ("too much bad bacteria" in her gut) Took ABX 2 weeks Has abdominal pain when she bends over Leg cramps with any activity  Feelstired, feet go to sleep, legs hurt with minimal palpation Hands numb at nighttime She reports having waxing waning bowel movements  She takes Torsemide rarely, once 3 a week She would like echocardiogram to reevaluate her ejection fraction given she does not feel well as above  EKG shows normal sinus rhythm with rate 74 bpm, left bundle branch block  Other past medical history On a previous office visit, we started her on entresto low-dose twice a day for systolic dysfunction.She took the medication until she ran out  of the medication on her recent cruise 2-3 weeks agoReports that she did not feel well on the cruise, slept many hours per day, had leg discomfortShe stopped the medication then reports that she felt better when she got home. She does not want to retry the extresto. Also reports it was very expensive for her, $179 per month   Recent stress test showed perfusion abnormality to the anterior wall, significant artifact , attenuation issue secondary to left bundle branch block  Cardiogram with ejection fraction 30-35%  Possible severe reaction to pneumonia shot with what sounds like viral symptoms,  Requiring prednisone, antibiotics.   chronic abdominal pain, stress from her grandchildren. Previousid ultrasound showing minimal plaque on the left less than 39%, none on the right Previous dehydration with overuse of torsemide Eating more, no regular exercise, sedentary in general secondary to fibromyalgia and fatigue  Previous admission to [facility] in December 2012 for bronchitis and hypoxia as well as systolic CHF. She had antibiotics, prednisone and diuresis and has felt better since discharge. ACE inhibitor placed on hold secondary to chronic cough.  S/P ICD 10 days ago for EF <35%.  Goal weight had been in the low 150 range. Previous Cardiac MRI showed ejection fraction 34 %    ICD placed  by Dr. Lovena Le.  Previous symptoms of malaise and fatigue, tachycardia, weight gain, leg swelling . previously taking high doses of diuretics for weight gain causing renal dysfunction, dehydration. For creatinine 1.9 we suggested she cut back on her diuretics. Prior weight was in the low 160 pound range  History  of diverticulosis, abscess formation, perforation requiring a long course of antibiotics with PICC line,  subsequent CT scan Sep 30 2013 showing free air, abscess requiring partial colectomy. She has an ostomy. Today she has lost 22 pounds by her report.   Previous echocardiogram shows  improvement in her ejection fraction up to 50%. Challenging image quality. On my own review, it does appear at least 45, if not 50%.   PMH:   has a past medical history of Abdominal aortic atherosclerosis ([place]) (11/2015); Allergy; Anemia; Anxiety; Anxiety; Automatic implantable cardioverter-defibrillator in situ (2012); Bronchiectasis; Cancer ([place]); Chest pain; Chronic sinusitis; Chronic systolic CHF (congestive heart failure) ([place]); Depression; Diarrhea (07/14/2008); Disorder of vocal cords; Essential hypertension; Fibrocystic breast disease; Fibromyalgia; GERD (gastroesophageal reflux disease); H/O multiple allergies (10/10/2013); Hemorrhoids; Hemorrhoids; History of diverticulitis of colon; History of pneumonia (2012); Insomnia; Irritable bowel syndrome; Laryngeal nodule; Lung disease (07/14/11); Migraine without aura, without mention of intractable migraine without mention of status migrainosus; Mitral regurgitation; Nonischemic cardiomyopathy ([place]); Osteoarthritis; Osteoporosis (01/2013); Pneumonia (2011); PONV (postoperative nausea and vomiting); Pre-syncope; Pure hypercholesterolemia; sigmoid diverticulitis with perforation, abscess and fistula (09/30/2013); and Sleep apnea (2010).  PSH:    Past Surgical History:  Procedure Laterality Date  . ABDOMINAL HYSTERECTOMY  1987   w/BSO  . APPENDECTOMY    . AUGMENTATION MAMMAPLASTY    . BREAST BIOPSY     "I've had 2-3 bx; all benign"  . breast cystectomies     due to FCBD  . BREAST MASS EXCISION  01/2010   Left-fibrocystic change w/intraductal papilloma, no malignancy  . carotid US  [id number]   [id number] LICA, normal RICA  . [employer] Bunionectomy  11/04/08   Right Great Toe (Dr. Beola Cord)  . COLONOSCOPY  1998   nml (sigmoid-Gilbert-nol)  . Colonoscopy  01/04/04   polyps, diverticulosis/severe  . COLOSTOMY N/A 10/02/2013   DESCENDING COLOSTOMY;  Surgeon: Imogene Burn. Tsuei, MD  . Derrill Memo REVERSAL N/A 02/10/2014   [name], MD  . CT chest  12/2010    possible bronchiectasis lower lobes, 02/2011 - enlarged bilateral effusions, bibasilar atx  . CT chest  04/2011   no PE.  abnormal R hilar and mediastinal adenopathy --> on rpt 05/2011, resolved  . DEXA  03/28/99   osteopenia L/S  . DEXA  03/12/03   2.2 Spine 0.1 Hip  Osteopenia  . DEXA  11/14/05   1.9 Spine 0.3 Hip  slight improvement  . DEXA  11/22/06   improved osteo and fem neck  . DEXA  01/2013   T score -2.6 at spine  . EMG/MCV  10/16/01   + mild carpal tunnel  . ESOPHAGOGASTRODUODENOSCOPY  09/2014   small HH, irregular Z line, fundic gland polyps with mild gastritis ([place])  . Flex laryngoscopy  06/11/06   ([place]) nml  . Head MRI, Head CT  10/1998   ? H/A focus ([place])  . ICD placement  07/14/11   w/pacemaker  . IMPLANTABLE CARDIOVERTER DEFIBRILLATOR IMPLANT N/A 07/14/2011   IMPLANTABLE CARDIOVERTER DEFIBRILLATOR IMPLANT;  Surgeon: Evans Lance, MD;  Location: [facility] CATH LAB;  Service: Cardiovascular;  Laterality: N/A;  . KNEE ARTHROSCOPY W/ ORIF     Left; "meniscus tear"  . KNEE SURGERY     left  . L/S films  11/21/01   nml; Right hip nml  . LASIK     left eye  . MANDIBLE SURGERY     "got about 6 pins in the bottom of my jaw"  . MANDIBLE SURGERY    .  MRI Right Hip  12/12/01   Tendonitis Gluteus Medius to Gtr Trochanter Neg Bursitis (Dr. Eulas Post)  . MRI U/S  11/21/01   min. DDD L/3-4  . NCS  11/2014   L ulnar neuropathy, mild B median nerve entrapment ([place])  . OSTEOTOMY     Left foot  . PARTIAL COLECTOMY N/A 10/02/2013   PARTIAL COLECTOMY;  Surgeon: Imogene Burn. Tsuei, MD  . sinus surgery     x3 with balloon   . stress myoview  02/2011   no significant ischemia  . TOE AMPUTATION     "toe beside baby toe on left foot; got gangrene from corn"  . TONSILLECTOMY AND ADENOIDECTOMY    . US ECHOCARDIOGRAPHY  04/2011   mildly dilated LV, EF 25-30%, hypokinesis, restrictive physiology, mod MR, no AS    Current Outpatient Prescriptions  Medication Sig Dispense Refill  .  alendronate (FOSAMAX) 70 MG tablet Take 70 mg by mouth once a week. Patient takes every Thursday    . ALPRAZolam (XANAX) 0.25 MG tablet Take 0.25 mg by mouth 2 (two) times daily as needed for anxiety. Reported on 11/17/2015    . Ascorbic Acid (VITAMIN C) 1000 MG tablet Take 1,000 mg by mouth daily.    Marland Kitchen aspirin EC 81 MG tablet Take 81 mg by mouth daily.    . Calcium Carbonate-Vitamin D (CALCIUM + D PO) Take 1 tablet by mouth daily.    . carvedilol (COREG) 6.25 MG tablet Take 1 tablet (6.25 mg total) by mouth 2 (two) times daily with a meal. 180 tablet 4  . dextromethorphan (DELSYM) 30 MG/5ML liquid Take 15 mg by mouth at bedtime as needed for cough.    . ezetimibe (ZETIA) 10 MG tablet TAKE 1 TABLET (10 MG TOTAL) BY MOUTH DAILY. 30 tablet 6  . Ferrous Sulfate 143 (45 Fe) MG TBCR Take 1 tablet by mouth every other day.    Marland Kitchen omeprazole (PRILOSEC) 40 MG capsule TAKE ONE CAPSULE BY MOUTH TWICE A DAY BEFORE MEALS 60 capsule 11  . potassium chloride (K-DUR) 10 MEQ tablet Take 10 mEq by mouth as directed. Take daily as needed    . Probiotic Product (PROBIOTIC DAILY) CAPS Take 1 capsule by mouth daily.    . Pyridoxine HCl (VITAMIN B6 PO) Take 1 capsule by mouth daily. Reported on 07/12/2015    . spironolactone (ALDACTONE) 12.5 mg TABS tablet Take 12.5 mg by mouth daily.    Marland Kitchen torsemide (DEMADEX) 10 MG tablet Take 10 mg by mouth daily as needed (swelling/fluid).    . triamcinolone (NASACORT) 55 MCG/ACT AERO nasal inhaler Place 2 sprays into the nose 2 (two) times daily.    . valsartan (DIOVAN) 80 MG tablet Take 1 tablet (80 mg total) by mouth daily. 30 tablet 11  . vitamin E 100 UNIT capsule Take 100 Units by mouth daily.     No current facility-administered medications for this visit.      Allergies:   Antihistamines, chlorpheniramine-type; Codeine; Cyclobenzaprine; Cymbalta [duloxetine hcl]; Influenza vac split [flu virus vaccine]; Penicillins; Prevnar [pneumococcal 13-val conj vacc]; Sulfasalazine;  Atorvastatin; Chlorhexidine; Dilaudid [hydromorphone hcl]; Latex; Morphine and related; Pravastatin; Prevacid [lansoprazole]; Red dye; Rosuvastatin; Simvastatin; Other; Tape; and Metaxalone   Social History:  The patient  reports that she has never smoked. She has never used smokeless tobacco. She reports that she does not drink alcohol or use drugs.   Family History:   family history includes Alcohol abuse in her paternal grandfather; Arthritis in her mother; Colon  polyps in her father; Dementia in her mother; Emphysema in her mother; Heart disease in her paternal grandfather and paternal grandmother; Hypertension in her maternal grandfather; Lung cancer in her father; Osteoporosis in her mother; Stroke in her mother and paternal grandfather.    Review of Systems: Review of Systems  Constitutional: Positive for malaise/fatigue.  Respiratory: Positive for shortness of breath.   Cardiovascular: Negative.   Gastrointestinal: Positive for diarrhea.  Musculoskeletal: Negative.   Neurological: Positive for weakness.  Psychiatric/Behavioral: Negative.   All other systems reviewed and are negative.    PHYSICAL EXAM: VS:  BP 108/60 (BP Location: Left Arm, Patient Position: Sitting, Cuff Size: Normal)   Pulse 74   Ht 5' 5.5" (1.664 m)   Wt 171 lb 8 oz (77.8 kg)   BMI 28.11 kg/m  , BMI Body mass index is 28.11 kg/m. GEN: Well nourished, well developed, in no acute distress  HEENT: normal  Neck: no JVD, carotid bruits, or masses Cardiac: RRR; no murmurs, rubs, or gallops,no edema  Respiratory:  clear to auscultation bilaterally, normal work of breathing GI: soft, nontender, nondistended, + BS MS: no deformity or atrophy  Skin: warm and dry, no rash Neuro:  Strength and sensation are intact Psych: euthymic mood, full affect    Recent Labs: 04/05/2015: TSH 0.85 08/30/2015: ALT 19; Hemoglobin 12.5; Platelets 253.0 11/22/2015: BUN 33; Creatinine, Ser 1.28; Potassium 4.3; Sodium 138     Lipid Panel Lab Results  Component Value Date   CHOL 218 (H) 06/01/2015   HDL 41.90 06/01/2015   LDLCALC 109 (H) 05/27/2014   TRIG 202.0 (H) 06/01/2015      Wt Readings from Last 3 Encounters:  01/12/16 171 lb 8 oz (77.8 kg)  11/22/15 172 lb (78 kg)  11/17/15 175 lb 3.2 oz (79.5 kg)       ASSESSMENT AND PLAN:  Abdominal aortic atherosclerosis ([place]) - Plan: EKG 12-Lead Currently not on a statin. Cholesterol mildly elevated  Cardiomyopathy, dilated, nonischemic ([facility]) - Plan: EKG 12-Lead Repeat echocardiogram pending Depressed ejection fraction last estimated February 2017 Continued shortness breath, general malaise On relatively good medications including beta blocker, ARB, Aldactone, diuretic  Chronic systolic CHF (congestive heart failure) ([place]) - Plan: EKG 12-Lead Appears relatively euvolemic on today's visit. No medication changes. Blood pressure borderline low. Recommended extra torsemide as needed for shortness of breath  SOB (shortness of breath) - Plan: EKG 12-Lead Recommended a regular exercise program that she has finished her move into the new house Some of her shortness of breath likely secondary to her cardiomyopathy  Chest pain, unspecified chest pain type - Plan: EKG 12-Lead Atypical chest pain symptoms. Will monitor for now. If this gets worse, could repeat ischemia workup    Total encounter time more than 25 minutes  Greater than 50% was spent in counseling and coordination of care with the patient   Disposition:   F/U  6 months  Orders Placed This Encounter  Procedures  . EKG 12-Lead     Signed, Esmond Plants, M.D., Ph.D. 01/12/2016  [place], [place]

## 2016-01-12 NOTE — Patient Instructions (Signed)
Medication Instructions:   No medication changes made  Labwork:  No new labs needed  Testing/Procedures:  We will order an echocardiogaram for cardiomyopathy, depressed ejection fraction   Follow-Up: It was a pleasure seeing you in the office today. Please call us if you have new issues that need to be addressed before your next appt.  [phone]  Your physician wants you to follow-up in: 6 months.  You will receive a reminder letter in the mail two months in advance. If you don't receive a letter, please call our office to schedule the follow-up appointment.  If you need a refill on your cardiac medications before your next appointment, please call your pharmacy.

## 2016-01-12 NOTE — Addendum Note (Signed)
Addended by: Valora Corporal on: 01/12/2016 01:01 PM   Modules accepted: Orders

## 2016-01-14 LAB — HM MAMMOGRAPHY: HM Mammogram: NORMAL (ref 0–4)

## 2016-01-20 ENCOUNTER — Ambulatory Visit (INDEPENDENT_AMBULATORY_CARE_PROVIDER_SITE_OTHER): Payer: Medicare Other | Admitting: Gastroenterology

## 2016-01-20 ENCOUNTER — Encounter: Payer: Self-pay | Admitting: Gastroenterology

## 2016-01-20 VITALS — BP 130/64 | HR 83 | Ht 65.5 in | Wt 173.0 lb

## 2016-01-20 DIAGNOSIS — R197 Diarrhea, unspecified: Secondary | ICD-10-CM

## 2016-01-20 DIAGNOSIS — K5909 Other constipation: Secondary | ICD-10-CM

## 2016-01-20 DIAGNOSIS — I255 Ischemic cardiomyopathy: Secondary | ICD-10-CM

## 2016-01-20 DIAGNOSIS — K589 Irritable bowel syndrome without diarrhea: Secondary | ICD-10-CM | POA: Diagnosis not present

## 2016-01-20 MED ORDER — AMBULATORY NON FORMULARY MEDICATION: 0 refills | Status: DC

## 2016-01-20 NOTE — Progress Notes (Signed)
Ashley [name]    [id number]    [birth year]  Primary Care Physician:Ashley Danise Mina, MD  Referring Physician: [name], MD [address] [place], [place] [address]  Chief complaint:  Diarrhea alternating with constipation, bloating  HPI:  70 year old female with history of diverticulosis status post perforated diverticulitis in May 2015 requiring laparotomy and colostomy with reversal in September 2015. She had dehiscence of both of her incisions. She was last seen in office by Ashley [name] in July 2017 and prior to that, I saw her in November 2016 at which time she declined colonoscopy, Last colonoscopy in 2005.  Cologuard study was negative, however.  She presents to our office today complaining of alternating between constipation and diarrhea along with significant amount gas and bloating since her colon surgeries. She also complains of hardening and distention on the left side of her abdomen that has been occurring intermittently since that time as well. She says that when she gets very hard also gets very warm to the touch. She denies any fevers or chills, nausea or vomiting. This resolves on its own until it occurs next time. She tells me that for her alternating bowel habit she has tried combination of Metamucil and MiraLAX, but that did not seem to help. She is on a daily probiotic.  She has many medication allergies/intolerances. She was given prescription of Xifaxan for possible small intestinal bacterial overgrowth, reports no improvement. CT abd & pelvis showed ventral hernia with short segment of transverse colon, no acute findings in the abdomen or pelvis but had some features in R middle lobe suggestive of possible sequelae of atypical infection   Outpatient Encounter Prescriptions as of 01/20/2016  Medication Sig  . alendronate (FOSAMAX) 70 MG tablet Take 70 mg by mouth once a week. Patient takes every Thursday  . Ascorbic Acid (VITAMIN C) 1000 MG  tablet Take 1,000 mg by mouth daily.  Marland Kitchen aspirin EC 81 MG tablet Take 81 mg by mouth daily.  . Calcium Carbonate-Vitamin D (CALCIUM + D PO) Take 1 tablet by mouth daily.  . carvedilol (COREG) 6.25 MG tablet Take 1 tablet (6.25 mg total) by mouth 2 (two) times daily with a meal.  . dextromethorphan (DELSYM) 30 MG/5ML liquid Take 15 mg by mouth at bedtime as needed for cough.  . ezetimibe (ZETIA) 10 MG tablet TAKE 1 TABLET (10 MG TOTAL) BY MOUTH DAILY.  Marland Kitchen Ferrous Sulfate 143 (45 Fe) MG TBCR Take 1 tablet by mouth every other day.  Marland Kitchen omeprazole (PRILOSEC) 40 MG capsule TAKE ONE CAPSULE BY MOUTH TWICE A DAY BEFORE MEALS  . potassium chloride (K-DUR) 10 MEQ tablet Take 10 mEq by mouth as directed. Take daily as needed  . Probiotic Product (PROBIOTIC DAILY) CAPS Take 1 capsule by mouth daily.  . Pyridoxine HCl (VITAMIN B6 PO) Take 1 capsule by mouth daily. Reported on 07/12/2015  . spironolactone (ALDACTONE) 12.5 mg TABS tablet Take 12.5 mg by mouth daily.  Marland Kitchen torsemide (DEMADEX) 10 MG tablet Take 10 mg by mouth daily as needed (swelling/fluid).  . triamcinolone (NASACORT) 55 MCG/ACT AERO nasal inhaler Place 2 sprays into the nose 2 (two) times daily.  . valsartan (DIOVAN) 80 MG tablet Take 1 tablet (80 mg total) by mouth daily.  . vitamin E 100 UNIT capsule Take 100 Units by mouth daily.  . [DISCONTINUED] ALPRAZolam (XANAX) 0.25 MG tablet Take 0.25 mg by mouth 2 (two) times daily as needed for  anxiety. Reported on 11/17/2015   No facility-administered encounter medications on file as of 01/20/2016.     Allergies as of 01/20/2016 - Review Complete 01/20/2016  Allergen Reaction Noted  . Antihistamines, chlorpheniramine-type Other (See Comments) 08/18/2010  . Codeine Other (See Comments) 10/23/2013  . Cyclobenzaprine Other (See Comments) 02/12/2013  . Cymbalta [duloxetine hcl] Other (See Comments) 10/22/2012  . Influenza vac split [flu virus vaccine] Other (See Comments) 08/18/2010  . Penicillins Other  (See Comments) 01/06/2010  . Prevnar [pneumococcal 13-val conj vacc] Other (See Comments) 08/30/2015  . Sulfasalazine Other (See Comments)   . Atorvastatin Other (See Comments) 06/06/2010  . Chlorhexidine Other (See Comments) 09/30/2013  . Dilaudid [hydromorphone hcl] Other (See Comments) 02/05/2014  . Latex Nausea Only, Rash, and Other (See Comments) 11/22/2006  . Morphine and related Other (See Comments) 02/05/2014  . Pravastatin Other (See Comments) 04/15/2014  . Prevacid [lansoprazole] Other (See Comments) 08/20/2013  . Red dye Nausea Only and Swelling 04/01/2012  . Rosuvastatin Other (See Comments) 06/06/2010  . Simvastatin Other (See Comments) 04/14/2014  . Other  05/28/2014  . Tape Other (See Comments) 10/23/2013  . Metaxalone Other (See Comments) 01/31/2013    Past Medical History:  Diagnosis Date  . Abdominal aortic atherosclerosis ([place]) 11/2015   by CT  . Allergy   . Anemia   . Anxiety   . Anxiety   . Automatic implantable cardioverter-defibrillator in situ 2012   a. MDT single lead ICD.  Marland Kitchen Bronchiectasis    a. possibly mild, treat URIs aggressively  . Cancer (HCC)    basal cell, arms & neck  . Chest pain    a. prev neg MV.  . Chronic sinusitis   . Chronic systolic CHF (congestive heart failure) (HCC)    a. EF 25-35%, LV dilation, significant MR, goal weight 150lbs;  b. 04/2014 Echo: EF 50-55%, Gr 1 DD.  Marland Kitchen Depression   . Diarrhea 07/14/2008   Qualifier: Diagnosis of  By: Glori Bickers MD, Carmell Austria   . Disorder of vocal cords   . Essential hypertension   . Fibrocystic breast disease   . Fibromyalgia   . GERD (gastroesophageal reflux disease)   . H/O multiple allergies 10/10/2013  . Hemorrhoids   . Hemorrhoids   . History of diverticulitis of colon   . History of pneumonia 2012  . Insomnia   . Irritable bowel syndrome   . Laryngeal nodule    Dr. Thomasena [name]  . Lung disease 07/14/11  . Migraine without aura, without mention of intractable migraine without mention of status  migrainosus   . Mitral regurgitation   . Nonischemic cardiomyopathy ([place])    a. Recovered LV fxn - prev as low as 25%, now 50-55% by echo 04/2014;  b. 2012 s/p MDT [id number] [place] VR single lead ICD.  Marland Kitchen Osteoarthritis    knees, back, hands, low back,   . Osteoporosis 01/2013  . Pneumonia 2011  . PONV (postoperative nausea and vomiting)   . Pre-syncope   . Pure hypercholesterolemia   . sigmoid diverticulitis with perforation, abscess and fistula 09/30/2013  . Sleep apnea 2010   borderline, inconclusive- /w      Past Surgical History:  Procedure Laterality Date  . ABDOMINAL HYSTERECTOMY  1987   w/BSO  . APPENDECTOMY    . AUGMENTATION MAMMAPLASTY    . BREAST BIOPSY     "I've had 2-3 bx; all benign"  . breast cystectomies     due to FCBD  . BREAST MASS EXCISION  01/2010   Left-fibrocystic change w/intraductal papilloma, no malignancy  . carotid US  [id number]   [id number] LICA, normal RICA  . [employer] Bunionectomy  11/04/08   Right Great Toe (Dr. Beola Cord)  . COLONOSCOPY  1998   nml (sigmoid-Gilbert-nol)  . Colonoscopy  01/04/04   polyps, diverticulosis/severe  . COLOSTOMY N/A 10/02/2013   DESCENDING COLOSTOMY;  Surgeon: Imogene Burn. Tsuei, MD  . Derrill Memo REVERSAL N/A 02/10/2014   [name], MD  . CT chest  12/2010   possible bronchiectasis lower lobes, 02/2011 - enlarged bilateral effusions, bibasilar atx  . CT chest  04/2011   no PE.  abnormal R hilar and mediastinal adenopathy --> on rpt 05/2011, resolved  . DEXA  03/28/99   osteopenia L/S  . DEXA  03/12/03   2.2 Spine 0.1 Hip  Osteopenia  . DEXA  11/14/05   1.9 Spine 0.3 Hip  slight improvement  . DEXA  11/22/06   improved osteo and fem neck  . DEXA  01/2013   T score -2.6 at spine  . EMG/MCV  10/16/01   + mild carpal tunnel  . ESOPHAGOGASTRODUODENOSCOPY  09/2014   small HH, irregular Z line, fundic gland polyps with mild gastritis ([place])  . Flex laryngoscopy  06/11/06   ([place]) nml  . Head MRI, Head CT  10/1998   ?  H/A focus ([place])  . ICD placement  07/14/11   w/pacemaker  . IMPLANTABLE CARDIOVERTER DEFIBRILLATOR IMPLANT N/A 07/14/2011   IMPLANTABLE CARDIOVERTER DEFIBRILLATOR IMPLANT;  Surgeon: Evans Lance, MD;  [facility];  Service: Cardiovascular;  Laterality: N/A;  . KNEE ARTHROSCOPY W/ ORIF     Left; "meniscus tear"  . KNEE SURGERY     left  . L/S films  11/21/01   nml; Right hip nml  . LASIK     left eye  . MANDIBLE SURGERY     "got about 6 pins in the bottom of my jaw"  . MANDIBLE SURGERY    . MRI Right Hip  12/12/01   Tendonitis Gluteus Medius to Gtr Trochanter Neg Bursitis (Dr. Eulas Post)  . MRI U/S  11/21/01   min. DDD L/3-4  . NCS  11/2014   L ulnar neuropathy, mild B median nerve entrapment ([place])  . OSTEOTOMY     Left foot  . PARTIAL COLECTOMY N/A 10/02/2013   PARTIAL COLECTOMY;  Surgeon: Imogene Burn. Tsuei, MD  . sinus surgery     x3 with balloon   . stress myoview  02/2011   no significant ischemia  . TOE AMPUTATION     "toe beside baby toe on left foot; got gangrene from corn"  . TONSILLECTOMY AND ADENOIDECTOMY    . US ECHOCARDIOGRAPHY  04/2011   mildly dilated LV, EF 25-30%, hypokinesis, restrictive physiology, mod MR, no AS    Family History  Problem Relation Age of Onset  . Lung cancer Father     + smoker  . Colon polyps Father   . Dementia Mother   . Osteoporosis Mother     Lumbar spine  . Stroke Mother     x 5 @ 53 YOA  . Arthritis Mother     hands  . Emphysema Mother   . Alcohol abuse Paternal Grandfather   . Stroke Paternal Grandfather     ?  Marland Kitchen Heart disease Paternal Grandfather   . Hypertension Maternal Grandfather     ?  Marland Kitchen Heart disease Paternal Grandmother   . Colon cancer Neg Hx   .  Esophageal cancer Neg Hx   . Gallbladder disease Neg Hx     Social History   Social History  . Marital status: Married    Spouse name: N/A  . Number of children: 1  . Years of education: N/A   Occupational History  . 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 [employer]    Social History Main Topics  . Smoking status: Never Smoker  . Smokeless tobacco: Never Used     Comment: Lived with smokers x 23 yrs, smoked herself "for a week"  . Alcohol use No  . Drug use: No  . Sexual activity: Not Currently   Other Topics Concern  . Not on file   Social History Narrative   1 adopted child      Lives with husband               Review of systems: Review of Systems  Constitutional: Negative for fever and chills.  HENT: Negative.   Eyes: Negative for blurred vision.  Respiratory: Negative for cough, shortness of breath and wheezing.   Cardiovascular: Negative for chest pain and palpitations.  Gastrointestinal: as per HPI Genitourinary: Negative for dysuria, urgency, frequency and hematuria.  Musculoskeletal: Positive for myalgias, back pain and joint pain.  Skin: Negative for itching and rash.  Neurological: Negative for dizziness, tremors, focal weakness, seizures and loss of consciousness.  Endo/Heme/Allergies: Positive for seasonal allergies.  Psychiatric/Behavioral: Negative for depression, suicidal ideas and hallucinations.  All other systems reviewed and are negative.   Physical Exam: Vitals:   01/20/16 1419  BP: 130/64  Pulse: 83   Body mass index is 28.35 kg/m. Gen:      No acute distress HEENT:  EOMI, sclera anicteric Neck:     No masses; no thyromegaly Lungs:    Clear to auscultation bilaterally; normal respiratory effort CV:         Regular rate and rhythm; no murmurs Abd:      + bowel sounds; soft, non-tender; no palpable masses, no distension Ext:    No edema; adequate peripheral perfusion Skin:      Warm and dry; no rash Neuro: alert and oriented x 3 Psych: normal mood and affect  Data Reviewed:  Reviewed labs, radiologic imaging, old records and pertinent past GI work up    Assessment and Plan/Recommendations:  23 yr F  with h/o sigmoid diverticulosis s/p perforated  diverticulitis, L colostomy and reversal of colostomy in Sep 2015 here with c/o alternating diarrhea and constipation.  Will obtain anorectal manometry to evaluate for outlet dysfunction Patient likely has adhesions given h/o perforated diverticulitis and reversal of colostomy, she also has ventral hernia. Will consider referral to pelvic floor PT based on findings of anorectal manometry The risks and benefits as well as alternative have been discussed and reviewed. All questions answered. The patient agrees to proceed with anorectal manometry. Start Probiotic Align 1 capsule daily IB guard BID as needed   25 minutes was spent face-to-face with the patient. Greater than 50% of the time used for counseling as well as treatment plan and follow-up. She had multiple questions which were answered to her satisfaction  K. Denzil Magnuson , MD [phone] Mon-Fri 8a-5p [phone] after 5p, weekends, holidays  CC: Ashley Bush, MD

## 2016-01-20 NOTE — Patient Instructions (Signed)
You have been scheduled to have an anorectal manometry at [facility] on 02/02/2016 at 8:30am. Please arrive 30 minutes prior to your appointment time for registration (1st floor of the hospital-admissions).  Please make certain to use 1 Fleets enema 2 hours prior to coming for your appointment. You can purchase Fleets enemas from the laxative section at your drug store. You should not eat anything during the two hours prior to the procedure. You may take regular medications with small sips of water at least 2 hours prior to the study.  Anorectal manometry is a test performed to evaluate patients with constipation or fecal incontinence. This test measures the pressures of the anal sphincter muscles, the sensation in the rectum, and the neural reflexes that are needed for normal bowel movements.  THE PROCEDURE The test takes approximately 30 minutes to 1 hour. You will be asked to change into a hospital gown. A technician or nurse will explain the procedure to you, take a brief health history, and answer any questions you may have. The patient then lies on his or her left side. A small, flexible tube, about the size of a thermometer, with a balloon at the end is inserted into the rectum. The catheter is connected to a machine that measures the pressure. During the test, the small balloon attached to the catheter may be inflated in the rectum to assess the normal reflex pathways. The nurse or technician may also ask the person to squeeze, relax, and push at various times. The anal sphincter muscle pressures are measured during each of these maneuvers. To squeeze, the patient tightens the sphincter muscles as if trying to prevent anything from coming out. To push or bear down, the patient strains down as if trying to have a bowel movement.    We will refer you to Pelvic Floor Therapy once your Anorectal Manometry is completed and resulted   Use Align 1 a day for 3 months  Use IB GARD 1 twice a  day as needed  We have given you a prescription of a squatty potty  To take to your pharmacy

## 2016-01-23 ENCOUNTER — Other Ambulatory Visit: Payer: Self-pay | Admitting: Cardiovascular Disease

## 2016-02-01 DIAGNOSIS — L298 Other pruritus: Secondary | ICD-10-CM | POA: Diagnosis not present

## 2016-02-01 DIAGNOSIS — Z124 Encounter for screening for malignant neoplasm of cervix: Secondary | ICD-10-CM | POA: Diagnosis not present

## 2016-02-01 DIAGNOSIS — M810 Age-related osteoporosis without current pathological fracture: Secondary | ICD-10-CM | POA: Diagnosis not present

## 2016-02-01 DIAGNOSIS — N952 Postmenopausal atrophic vaginitis: Secondary | ICD-10-CM | POA: Diagnosis not present

## 2016-02-01 DIAGNOSIS — Z1231 Encounter for screening mammogram for malignant neoplasm of breast: Secondary | ICD-10-CM | POA: Diagnosis not present

## 2016-02-01 LAB — HM MAMMOGRAPHY

## 2016-02-02 ENCOUNTER — Ambulatory Visit (HOSPITAL_COMMUNITY)
Admission: RE | Admit: 2016-02-02 | Discharge: 2016-02-02 | Disposition: A | Discharge status: Home / Self Care | Payer: Medicare Other | Source: Ambulatory Visit | Attending: Gastroenterology | Admitting: Gastroenterology

## 2016-02-02 ENCOUNTER — Encounter (HOSPITAL_COMMUNITY)
Admission: RE | Disposition: A | Discharge status: Home / Self Care | Payer: Self-pay | Source: Ambulatory Visit | Attending: Gastroenterology

## 2016-02-02 DIAGNOSIS — K6289 Other specified diseases of anus and rectum: Secondary | ICD-10-CM | POA: Diagnosis not present

## 2016-02-02 DIAGNOSIS — R159 Full incontinence of feces: Secondary | ICD-10-CM | POA: Diagnosis not present

## 2016-02-02 DIAGNOSIS — M79672 Pain in left foot: Secondary | ICD-10-CM | POA: Diagnosis not present

## 2016-02-02 DIAGNOSIS — M79671 Pain in right foot: Secondary | ICD-10-CM | POA: Diagnosis not present

## 2016-02-02 HISTORY — PX: ANAL RECTAL MANOMETRY: SHX6358

## 2016-02-02 SURGERY — MANOMETRY, ANORECTAL

## 2016-02-02 NOTE — Progress Notes (Signed)
Anal Manometry done per protocol. Pt tolerated well without complication. No blood noted on probe after removal or on balloon during balloon expulsion test. Report to be sent to Dr. Silverio Decamp.

## 2016-02-03 ENCOUNTER — Encounter (HOSPITAL_COMMUNITY): Payer: Self-pay | Admitting: Gastroenterology

## 2016-02-04 ENCOUNTER — Ambulatory Visit (INDEPENDENT_AMBULATORY_CARE_PROVIDER_SITE_OTHER): Payer: Medicare Other

## 2016-02-04 ENCOUNTER — Other Ambulatory Visit: Payer: Self-pay

## 2016-02-04 DIAGNOSIS — I429 Cardiomyopathy, unspecified: Secondary | ICD-10-CM | POA: Diagnosis not present

## 2016-02-04 DIAGNOSIS — R0602 Shortness of breath: Secondary | ICD-10-CM | POA: Diagnosis not present

## 2016-02-04 DIAGNOSIS — I5022 Chronic systolic (congestive) heart failure: Secondary | ICD-10-CM

## 2016-02-04 DIAGNOSIS — I340 Nonrheumatic mitral (valve) insufficiency: Secondary | ICD-10-CM

## 2016-02-04 DIAGNOSIS — I420 Dilated cardiomyopathy: Secondary | ICD-10-CM

## 2016-02-07 ENCOUNTER — Telehealth: Payer: Self-pay | Admitting: Family Medicine

## 2016-02-07 NOTE — Telephone Encounter (Signed)
Noted  

## 2016-02-07 NOTE — Telephone Encounter (Signed)
Pt has appt with Dr Damita Dunnings 02/08/16 at 2 pm.

## 2016-02-07 NOTE — Telephone Encounter (Signed)
[facility] Patient Name: GLENDON LOWDERMILK DOB: 05/24/1945 Initial Comment Caller says, 2 yrs ago had colon partially removed. She has pain in Left side, which is hot and hard, for 3 weeks. She feels like she has another abscess. Abd pain mild today. Nurse Assessment Nurse: Dimas Chyle, RN, Dellis Filbert Date/Time [name] Time): 02/07/2016 10:58:18 AM Confirm and document reason for call. If symptomatic, describe symptoms. You must click the next button to save text entered. ---Caller says, 2 yrs ago had colon partially removed. She has pain in Left side, which is hot and hard, for 3 weeks. She feels like she has another abscess. Abd pain mild today. Saw GI doctor 2 weeks ago and is scheduled for scope. Has multiple hernias. No fever. Has the patient traveled out of the country within the last 30 days? ---No Does the patient have any new or worsening symptoms? ---Yes Will a triage be completed? ---Yes Related visit to physician within the last 2 weeks? ---Yes Does the PT have any chronic conditions? (i.e. diabetes, asthma, etc.) ---Yes List chronic conditions. ---Pacemaker/defibrillator, hx of colectomy with repair anastomosis Is this a behavioral health or substance abuse call? ---No Guidelines Guideline Title Affirmed Question Affirmed Notes Abdominal Pain - Female Age > 60 years Final Disposition User See Physician within 24 Hours [place], RN, [employer] Referrals REFERRED TO PCP OFFICE Disagree/Comply: Leta Baptist

## 2016-02-08 ENCOUNTER — Telehealth: Payer: Self-pay | Admitting: Gastroenterology

## 2016-02-08 ENCOUNTER — Ambulatory Visit (INDEPENDENT_AMBULATORY_CARE_PROVIDER_SITE_OTHER): Payer: Medicare Other | Admitting: Family Medicine

## 2016-02-08 ENCOUNTER — Encounter: Payer: Self-pay | Admitting: Family Medicine

## 2016-02-08 VITALS — BP 108/60 | HR 85 | Temp 98.4°F | Wt 173.5 lb

## 2016-02-08 DIAGNOSIS — R109 Unspecified abdominal pain: Secondary | ICD-10-CM

## 2016-02-08 DIAGNOSIS — I255 Ischemic cardiomyopathy: Secondary | ICD-10-CM

## 2016-02-08 DIAGNOSIS — R159 Full incontinence of feces: Secondary | ICD-10-CM

## 2016-02-08 DIAGNOSIS — M810 Age-related osteoporosis without current pathological fracture: Secondary | ICD-10-CM | POA: Diagnosis not present

## 2016-02-08 LAB — CBC WITH DIFFERENTIAL/PLATELET
Basophils Absolute: 0.1 10*3/uL (ref 0.0–0.1)
Basophils Relative: 1.2 % (ref 0.0–3.0)
Eosinophils Absolute: 0.1 10*3/uL (ref 0.0–0.7)
Eosinophils Relative: 1.1 % (ref 0.0–5.0)
HCT: 37.5 % (ref 36.0–46.0)
Hemoglobin: 12.6 g/dL (ref 12.0–15.0)
Lymphocytes Relative: 23.2 % (ref 12.0–46.0)
Lymphs Abs: 2.8 10*3/uL (ref 0.7–4.0)
MCHC: 33.7 g/dL (ref 30.0–36.0)
MCV: 88.3 fl (ref 78.0–100.0)
Monocytes Absolute: 0.7 10*3/uL (ref 0.1–1.0)
Monocytes Relative: 6 % (ref 3.0–12.0)
Neutro Abs: 8.2 10*3/uL — ABNORMAL HIGH (ref 1.4–7.7)
Neutrophils Relative %: 68.5 % (ref 43.0–77.0)
Platelets: 232 10*3/uL (ref 150.0–400.0)
RBC: 4.25 Mil/uL (ref 3.87–5.11)
RDW: 13.2 % (ref 11.5–15.5)
WBC: 11.9 10*3/uL — ABNORMAL HIGH (ref 4.0–10.5)

## 2016-02-08 LAB — COMPREHENSIVE METABOLIC PANEL
ALT: 24 U/L (ref 0–35)
AST: 23 U/L (ref 0–37)
Albumin: 4.1 g/dL (ref 3.5–5.2)
Alkaline Phosphatase: 100 U/L (ref 39–117)
BUN: 19 mg/dL (ref 6–23)
CO2: 28 mEq/L (ref 19–32)
Calcium: 9.2 mg/dL (ref 8.4–10.5)
Chloride: 103 mEq/L (ref 96–112)
Creatinine, Ser: 1.2 mg/dL (ref 0.40–1.20)
GFR: 47.13 mL/min — ABNORMAL LOW (ref >60.00)
Glucose, Bld: 96 mg/dL (ref 70–99)
Potassium: 4 mEq/L (ref 3.5–5.1)
Sodium: 140 mEq/L (ref 135–145)
Total Bilirubin: 0.2 mg/dL (ref 0.2–1.2)
Total Protein: 7.6 g/dL (ref 6.0–8.3)

## 2016-02-08 LAB — LIPASE: Lipase: 36 U/L (ref 11.0–59.0)

## 2016-02-08 NOTE — Telephone Encounter (Signed)
Called back and discussed with Dr Damita Dunnings, agree with repeat labs and CT abd & pelvis with contrast

## 2016-02-08 NOTE — Progress Notes (Signed)
Pre visit review using our clinic review tool, if applicable. No additional management support is needed unless otherwise documented below in the visit note. 

## 2016-02-08 NOTE — Progress Notes (Signed)
abd pain.  L sided abd pain.  Not having typical R sided abd pain.  More bloating.  The L side of abd will get hard.   She has likely pelvic floor weakness, with prev GI eval noted.   She has a known hernia on the prev CT.  She is still constipated but will still have episodes of fecal urgency.    No fevers but the L side of abd will get pink episodically.  Pink color can get some worse around the time of the BM but then lighten up later.  She is feeling more overwhelmed with all of her illnesses.   H/o complicated diverticulitis noted.    PMH and SH reviewed  ROS: Per HPI unless specifically indicated in ROS section   Meds, vitals, and allergies reviewed.   GEN: nad, alert and oriented HEENT: mucous membranes moist NECK: supple w/o LA CV: rrr.  PULM: ctab, no inc wob ABD: soft, +bs, old scarring/postsurgical changes noted, abd diffusely but mildly ttp w/o rebound.  She does have chronic abdominal wall deformity from combination of prev surgery and hernia.  No skin erythema.   EXT: no edema SKIN: no acute rash

## 2016-02-08 NOTE — Telephone Encounter (Signed)
Would like to speak to Dr Silverio Decamp today 02-08-16 please

## 2016-02-08 NOTE — Patient Instructions (Signed)
Go to the lab on the way out.  We'll contact you with your lab report. I'll check with GI in the meantime.  Take care.  Glad to see you.  If the pain is suddenly worse or if you have a fever, then go to the ER.

## 2016-02-08 NOTE — Op Note (Signed)
Patient: Ashley [name], Ashley [name]   [id number] Gender: Female Procedure: anorectal manometry   DOB: [birth year] Physician: Dr. Genia Plants   Height: 5 ft 5 in Operator: Tory Emerald RN   Weight:  Referring Physician:      Examination Date: 02/02/2016    Resting  Normal Squeeze  Normal  Mean Sphincter Pressure(rectal ref.)(mmHg) 40.6  Max. Sphincter Pressure(rectal ref.)(mmHg) 86.2   Max. Sphincter Pressure(rectal ref.)(mmHg) 48.6  Max. Sphincter Pressure(abs. ref.)(mmHg) 100.3 164-233  Mean Sphincter Pressure(abs. ref.)(mmHg) 53.0  Duration of sustained squeeze(sec) 10.9 26-31  Max. Sphincter Pressure(abs. ref.)(mmHg) 61.1 61-83     Length of HPZ(cm) 4.3 3.8-4.5     Length verge to center(cm) 1.5             Push(attempted defecation)  Normal Balloon Inflation  Normal  Residual Anal Pressure(abs. ref.)(mmHg) 44.3 39-55 RAIR Present present  Percent anal relaxation(%) 39  First sensation(cc) 60   Intrarectal pressure(mmHg) 51.9 34-55 Urge to defecate(cc) 80      Discomfort(cc) 110       Procedure   3D anorectal manometry catheter placed without difficulty after digital exam was performed.  Procedure performed without difficulty and with patient's full cooperation.  Catheter was removed and patient denied pain or discomfort.     Balloon expulsion test performed.  Patient unable to expel balloon within one minute. Pt stated it was hurting at one minute and unable to push balloon out. Water removed from balloon and taken out of pt.    Indications  Fecal Incontinence   Interpretation / Findings  Low anal sphincter resting and squeeze pressures  Appropriate relaxation of the anal sphincter with elevated intrarectal pressure during attempted defecation  Rectoanal inhibitory reflex present  Abnormal rectal sensation, first sensation at 60vv Abnormal balloon expulsion test; discomfort with balloon insertion and was removed within 1 minute  Impression and Recommendations Weak internal and external  anal sphincters No evidence of dyssynergia defecation based on manometry Abnormal rectal sensation suggestive of possible hyposensitivity Can benefit from pelvic floor strengthening and PT, will refer to Ashley Sauger, MD [facility]

## 2016-02-09 ENCOUNTER — Other Ambulatory Visit: Payer: Self-pay | Admitting: *Deleted

## 2016-02-09 ENCOUNTER — Ambulatory Visit (INDEPENDENT_AMBULATORY_CARE_PROVIDER_SITE_OTHER)
Admission: RE | Admit: 2016-02-09 | Discharge: 2016-02-09 | Disposition: A | Discharge status: Home / Self Care | Payer: Medicare Other | Source: Ambulatory Visit | Attending: Family Medicine | Admitting: Family Medicine

## 2016-02-09 DIAGNOSIS — R1032 Left lower quadrant pain: Secondary | ICD-10-CM | POA: Diagnosis not present

## 2016-02-09 DIAGNOSIS — R159 Full incontinence of feces: Secondary | ICD-10-CM

## 2016-02-09 DIAGNOSIS — R109 Unspecified abdominal pain: Secondary | ICD-10-CM

## 2016-02-09 MED ORDER — IOPAMIDOL (ISOVUE-300) INJECTION 61%
100.0000 mL | Freq: Once | INTRAVENOUS | Status: AC | PRN
  Administered 2016-02-09: 100 mL via INTRAVENOUS

## 2016-02-09 NOTE — Assessment & Plan Note (Signed)
Discussed with patient. See notes on labs. See notes on CT report. At this point the plan is to exclude an acute ominous process. She has no acute findings on the CT. My understanding of the plan going forward is for her to proceed with pelvic floor physical therapy. It is likely that she has muscle deconditioning in the abdomen and pelvic floor that is complicating her ability to fully evacuate her colon. This is likely contributing to her symptoms. The patient is nontoxic, she is still having flatus and bowel movements, she is not obstructed by history or by CT. She is still okay for outpatient follow-up. I did not change her medications. I will route this to the primary Doc and to GI for input. I appreciate help all involved.

## 2016-02-16 ENCOUNTER — Telehealth: Payer: Self-pay | Admitting: Cardiology

## 2016-02-16 ENCOUNTER — Ambulatory Visit (INDEPENDENT_AMBULATORY_CARE_PROVIDER_SITE_OTHER): Payer: Medicare Other | Admitting: *Deleted

## 2016-02-16 ENCOUNTER — Ambulatory Visit (INDEPENDENT_AMBULATORY_CARE_PROVIDER_SITE_OTHER): Payer: Medicare Other | Admitting: Family Medicine

## 2016-02-16 ENCOUNTER — Encounter: Payer: Self-pay | Admitting: Family Medicine

## 2016-02-16 VITALS — BP 100/70 | HR 88 | Temp 97.5°F | Wt 172.2 lb

## 2016-02-16 DIAGNOSIS — I255 Ischemic cardiomyopathy: Secondary | ICD-10-CM | POA: Diagnosis not present

## 2016-02-16 DIAGNOSIS — T887XXS Unspecified adverse effect of drug or medicament, sequela: Secondary | ICD-10-CM

## 2016-02-16 DIAGNOSIS — M797 Fibromyalgia: Secondary | ICD-10-CM

## 2016-02-16 DIAGNOSIS — R8290 Unspecified abnormal findings in urine: Secondary | ICD-10-CM

## 2016-02-16 DIAGNOSIS — I429 Cardiomyopathy, unspecified: Secondary | ICD-10-CM | POA: Diagnosis not present

## 2016-02-16 DIAGNOSIS — T50A95S Adverse effect of other bacterial vaccines, sequela: Secondary | ICD-10-CM

## 2016-02-16 DIAGNOSIS — R159 Full incontinence of feces: Secondary | ICD-10-CM

## 2016-02-16 DIAGNOSIS — I420 Dilated cardiomyopathy: Secondary | ICD-10-CM

## 2016-02-16 DIAGNOSIS — N3289 Other specified disorders of bladder: Secondary | ICD-10-CM | POA: Diagnosis not present

## 2016-02-16 DIAGNOSIS — R194 Change in bowel habit: Secondary | ICD-10-CM

## 2016-02-16 LAB — POC URINALSYSI DIPSTICK (AUTOMATED)
Bilirubin, UA: NEGATIVE
Blood, UA: NEGATIVE
Glucose, UA: NEGATIVE
Ketones, UA: NEGATIVE
Nitrite, UA: NEGATIVE
Protein, UA: NEGATIVE
Spec Grav, UA: 1.025
Urobilinogen, UA: 0.2
pH, UA: 6

## 2016-02-16 NOTE — Telephone Encounter (Signed)
Spoke with pt and reminded pt of remote transmission that is due today. Pt verbalized understanding.   

## 2016-02-16 NOTE — Progress Notes (Signed)
Pre visit review using our clinic review tool, if applicable. No additional management support is needed unless otherwise documented below in the visit note. 

## 2016-02-16 NOTE — Addendum Note (Signed)
Addended by: Royann Shivers A on: 02/16/2016 10:49 AM   Modules accepted: Orders

## 2016-02-16 NOTE — Assessment & Plan Note (Signed)
Discussed bowel regimen. States she has been advised against fiber supplements and miralax by neurology and gastroenterology. Align worsening abdominal discomfort. Continue probiotic.

## 2016-02-16 NOTE — Assessment & Plan Note (Signed)
Discussed anal manometry results showing weakening of anal sphincters, encouraged she seek pelvic floor PT and discussed possible benefits of this. Pt agrees. Desires to start with external therapy prior to considering internal therapy.

## 2016-02-16 NOTE — Assessment & Plan Note (Signed)
By CT last week, along with more malodorous urine - check UA today.

## 2016-02-16 NOTE — Addendum Note (Signed)
Addended by: [name] on: 02/16/2016 12:47 PM   Modules accepted: Orders

## 2016-02-16 NOTE — Assessment & Plan Note (Signed)
Declines injection due to bad reaction to pneumovax in the past.

## 2016-02-16 NOTE — Progress Notes (Addendum)
BP 100/70   Pulse 88   Temp 97.5 F (36.4 C) (Oral)   Wt 172 lb 4 oz (78.1 kg)   BMI 28.23 kg/m    CC: discuss concerns Subjective:    Patient ID: Ashley [name], female    DOB: Feb 14, 1946, 70 y.o.   MRN: [id number]  HPI: Ashley [name] is a 70 y.o. female presenting on 02/16/2016 for Follow-up (discuss internal PT)   Recent notes reviewed. Seen last week by Dr Damita Dunnings with L sided abdominal pain. Given complicated history, CT scan and labs were ordered. Overall unrevealing (ventral hernia, mild chronic CKD, mild leukocytosis). 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 GI also recommended 1 capsule align daily and IB guard BID PRN.   Bowel regimen - align worsened pain so she has started different daily probiotic. Neurology recommended she back off miralax.   Pt worried that PT will cause worsening pain given she is very tender with GYN exams.   Latest UA was 3-4 wks ago and normal. Noticing stronger smelling urine, noticed over last few weeks.   She had recent 3D mammogram, dexa and well woman exam last year ([facility]).   Declines flu shot today - had bad reaction to pneumonia shot.   Relevant past medical, surgical, family and social history reviewed and updated as indicated. Interim medical history since our last visit reviewed. Allergies and medications reviewed and updated. Current Outpatient Prescriptions on File Prior to Visit  Medication Sig  . alendronate (FOSAMAX) 70 MG tablet Take 70 mg by mouth once a week. Patient takes every Thursday  . AMBULATORY NON FORMULARY MEDICATION Medication Name:  1 squatty potty to use as needed  . Ascorbic Acid (VITAMIN C) 1000 MG tablet Take 1,000 mg by mouth daily.  Marland Kitchen aspirin EC 81 MG tablet Take 81 mg by mouth daily.  . Calcium Carbonate-Vitamin D (CALCIUM + D PO) Take 1 tablet by mouth daily.  .  carvedilol (COREG) 6.25 MG tablet Take 1 tablet (6.25 mg total) by mouth 2 (two) times daily with a meal.  . dextromethorphan (DELSYM) 30 MG/5ML liquid Take 15 mg by mouth at bedtime as needed for cough.  . ezetimibe (ZETIA) 10 MG tablet TAKE 1 TABLET (10 MG TOTAL) BY MOUTH DAILY.  Marland Kitchen Ferrous Sulfate 143 (45 Fe) MG TBCR Take 1 tablet by mouth every other day.  Marland Kitchen omeprazole (PRILOSEC) 40 MG capsule TAKE ONE CAPSULE BY MOUTH TWICE A DAY BEFORE MEALS  . potassium chloride (K-DUR) 10 MEQ tablet Take 10 mEq by mouth as directed. Take daily as needed  . Probiotic Product (PROBIOTIC DAILY) CAPS Take 1 capsule by mouth daily.  . Pyridoxine HCl (VITAMIN B6 PO) Take 1 capsule by mouth daily. Reported on 07/12/2015  . spironolactone (ALDACTONE) 12.5 mg TABS tablet Take 12.5 mg by mouth daily.  Marland Kitchen torsemide (DEMADEX) 10 MG tablet Take 10 mg by mouth daily as needed (swelling/fluid).  . triamcinolone (NASACORT) 55 MCG/ACT AERO nasal inhaler Place 2 sprays into the nose 2 (two) times daily.  . valsartan (DIOVAN) 80 MG tablet Take 1 tablet (80 mg total) by mouth daily.  . vitamin E 100 UNIT capsule Take 100 Units by mouth daily.  . [DISCONTINUED] bisoprolol (ZEBETA) 5 MG tablet Take 5 mg by mouth daily.   No current facility-administered medications on file prior to visit.     Review of Systems Per  HPI unless specifically indicated in ROS section     Objective:    BP 100/70   Pulse 88   Temp 97.5 F (36.4 C) (Oral)   Wt 172 lb 4 oz (78.1 kg)   BMI 28.23 kg/m   Wt Readings from Last 3 Encounters:  02/16/16 172 lb 4 oz (78.1 kg)  02/08/16 173 lb 8 oz (78.7 kg)  01/20/16 173 lb (78.5 kg)    Physical Exam  Constitutional: She appears well-developed and well-nourished. No distress.  HENT:  Head: Normocephalic and atraumatic.  Mouth/Throat: Oropharynx is clear and moist. No oropharyngeal exudate.  Eyes: Conjunctivae and EOM are normal. Pupils are equal, round, and reactive to light. No scleral  icterus.  Cardiovascular: Normal rate, regular rhythm, normal heart sounds and intact distal pulses.   No murmur heard. Pulmonary/Chest: Effort normal and breath sounds normal. No respiratory distress. She has no wheezes.  Abdominal: Soft. Bowel sounds are normal. She exhibits no distension and no mass. There is tenderness. There is no rebound and no guarding.  Musculoskeletal: She exhibits no edema.  Skin: Skin is warm and dry. No rash noted.  Psychiatric: She has a normal mood and affect.  Nursing note and vitals reviewed.  Results for orders placed or performed in visit on 02/16/16  HM MAMMOGRAPHY  Result Value Ref Range   HM Mammogram Self Reported Normal 0-4 Bi-Rad, Self Reported Normal  POCT Urinalysis Dipstick (Automated)  Result Value Ref Range   Color, UA Yellow    Clarity, UA Clear    Glucose, UA Negative    Bilirubin, UA Negative    Ketones, UA Negative    Spec Grav, UA 1.025    Blood, UA Negative    pH, UA 6.0    Protein, UA Negative    Urobilinogen, UA 0.2    Nitrite, UA Negative    Leukocytes, UA small (1+) (A) Negative   CT ABDOMEN AND PELVIS WITH CONTRAST TECHNIQUE: Multidetector CT imaging of the abdomen and pelvis was performed using the standard protocol following bolus administration of intravenous contrast. CONTRAST:  116mL ISOVUE-300 IOPAMIDOL (ISOVUE-300) INJECTION 61% COMPARISON:  11/24/2015 FINDINGS: Lower chest: Again noted patchy tree-in-bud opacity in right middle lobe without change from prior exam. Partially visualized cardiac pacemaker leads. Hepatobiliary: Enhanced liver is unremarkable. No calcified gallstones are noted within gallbladder. Pancreas: Enhanced pancreas is unremarkable. Spleen: Enhanced spleen is unremarkable. Adrenals/Urinary Tract: No adrenal gland mass. Stable bilateral renal cysts. No hydronephrosis or hydroureter. Delayed renal images shows bilateral renal symmetrical excretion. Bilateral visualized proximal ureter is  unremarkable. Stomach/Bowel: No gastric outlet obstruction. Oral contrast material was given to the patient. No small bowel obstruction. The terminal ileum is unremarkable. There is a low lying cecum. Contrast material noted throughout the colon. No colonic obstruction. No distal colitis or diverticulitis. The patient is status post appendectomy. Vascular/Lymphatic: No retroperitoneal or mesenteric adenopathy. No aortic aneurysm. Mild atherosclerotic calcifications are noted distal abdominal aorta. Reproductive: The patient is status post hysterectomy no pelvic mass. Other: There is thickening of urinary bladder wall. Cystitis is suspected. Clinical correlation is necessary. Again noted midline anterior abdominal wall ventral hernia containing omental fat segment of the transverse colon and small bowel loops without evidence of acute complication or small bowel obstruction. The hernia measures at least 15 cm by 4.8 cm. Best seen in sagittal image 49 extending about 10 cm supraumbilical region. Musculoskeletal: No destructive bony lesions are noted. Sagittal images of the spine are unremarkable. IMPRESSION: 1. There is no evidence  of acute inflammatory process within abdomen. 2. No hydronephrosis or hydroureter. 3. Low lying cecum. No pericecal inflammation. No small bowel obstruction. Status post appendectomy. 4. Again noted ventral hernia containing omental fat short segment of small bowel and short segment of transverse colon. No evidence of acute complication or small bowel obstruction. 5. There is thickening of urinary bladder wall. Cystitis cannot be excluded.  6. No distal colitis or diverticulitis. No distal colonic obstruction. 7. No hydronephrosis or hydroureter. 8. Status post hysterectomy. Electronically Signed   By: Lahoma Crocker M.D.   On: 02/09/2016 09:43    Assessment & Plan:  Over 25 minutes were spent face-to-face with the patient during this encounter and >50% of that  time was spent on counseling and coordination of care  Problem List Items Addressed This Visit    Bladder wall thickening    By CT last week, along with more malodorous urine - check UA today.       Relevant Orders   POCT Urinalysis Dipstick (Automated) (Completed)   Urine culture   Change in bowel habits    Discussed bowel regimen. States she has been advised against fiber supplements and miralax by neurology and gastroenterology. Align worsening abdominal discomfort. Continue probiotic.       Fecal incontinence - Primary    Discussed anal manometry results showing weakening of anal sphincters, encouraged she seek pelvic floor PT and discussed possible benefits of this. Pt agrees. Desires to start with external therapy prior to considering internal therapy.       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        Other Visit Diagnoses    Abnormal urinalysis       Relevant Orders   Urine culture       Follow up plan: Return in about 4 months (around 06/18/2016), or if symptoms worsen or fail to improve, for medicare wellness visit.  [name], MD

## 2016-02-16 NOTE — Patient Instructions (Addendum)
Urinalysis today. I agree with starting with external PT, then consider trial of internal PT.  Good to see you today, call us with questions. Return after 06/07/2016 for medicare wellness visit

## 2016-02-17 LAB — URINE CULTURE

## 2016-02-17 NOTE — Progress Notes (Signed)
Remote ICD transmission.   

## 2016-02-18 ENCOUNTER — Encounter: Payer: Self-pay | Admitting: Cardiology

## 2016-02-21 ENCOUNTER — Encounter: Payer: Self-pay | Admitting: Family Medicine

## 2016-02-21 ENCOUNTER — Ambulatory Visit: Payer: Medicare Other | Attending: Gastroenterology | Admitting: Physical Therapy

## 2016-02-21 DIAGNOSIS — M6281 Muscle weakness (generalized): Secondary | ICD-10-CM

## 2016-02-21 DIAGNOSIS — M62838 Other muscle spasm: Secondary | ICD-10-CM | POA: Diagnosis not present

## 2016-02-21 DIAGNOSIS — R279 Unspecified lack of coordination: Secondary | ICD-10-CM | POA: Diagnosis not present

## 2016-02-21 NOTE — Patient Instructions (Signed)
About Abdominal Massage  Abdominal massage, also called external colon massage, is a self-treatment circular massage technique that can reduce and eliminate gas and ease constipation. The colon naturally contracts in waves in a clockwise direction starting from inside the right hip, moving up toward the ribs, across the belly, and down inside the left hip.  When you perform circular abdominal massage, you help stimulate your colon's normal wave pattern of movement called peristalsis.  It is most beneficial when done after eating.  Positioning You can practice abdominal massage with oil while lying down, or in the shower with soap.  Some people find that it is just as effective to do the massage through clothing while sitting or standing.  How to Massage Start by placing your finger tips or knuckles on your right side, just inside your hip bone.  . Make small circular movements while you move upward toward your rib cage.   . Once you reach the bottom right side of your rib cage, take your circular movements across to the left side of the bottom of your rib cage.  . Next, move downward until you reach the inside of your left hip bone.  This is the path your feces travel in your colon. . Continue to perform your abdominal massage in this pattern for 10 minutes each day.     You can apply as much pressure as is comfortable in your massage.  Start gently and build pressure as you continue to practice.  Notice any areas of pain as you massage; areas of slight pain may be relieved as you massage, but if you have areas of significant or intense pain, consult with your healthcare provider.  Other Considerations . General physical activity including bending and stretching can have a beneficial massage-like effect on the colon.  Deep breathing can also stimulate the colon because breathing deeply activates the same nervous system that supplies the colon.   . Abdominal massage should always be used in  combination with a bowel-conscious diet that is high in the proper type of fiber for you, fluids (primarily water), and a regular exercise program. There were no vitals taken for this visit. [facility] [address] [place], [place] [address] Phone # [phone] Fax [phone]

## 2016-02-21 NOTE — Therapy (Signed)
[facility] [address] [place], [place], [address] Phone: [phone]   Fax:  [phone]  Physical Therapy Evaluation  Patient Details  Name: Ashley [name] MRN: [id number] Date of Birth: 1946/01/03 Referring Provider: Dr. Harl Bowie  Encounter Date: 02/21/2016      PT End of Session - 02/21/16 1304    Visit Number 1   Number of Visits 10   Date for PT Re-Evaluation 04/17/16   Authorization Type medicare g-code on 10th visit   PT Start Time 1145   PT Stop Time 1300   PT Time Calculation (min) 75 min   Activity Tolerance Patient tolerated treatment well   Behavior During Therapy [facility] for tasks assessed/performed      Past Medical History:  Diagnosis Date  . Abdominal aortic atherosclerosis ([place]) 11/2015   by CT  . Allergy   . Anemia   . Anxiety   . Automatic implantable cardioverter-defibrillator in situ 2012   a. MDT single lead ICD.  Marland Kitchen Bronchiectasis    a. possibly mild, treat URIs aggressively  . Cancer (HCC)    basal cell, arms & neck  . Chest pain    a. prev neg MV.  . Chronic sinusitis   . Chronic systolic CHF (congestive heart failure) (HCC)    a. EF 25-35%, LV dilation, significant MR, goal weight 150lbs;  b. 04/2014 Echo: EF 50-55%, Gr 1 DD.  Marland Kitchen Depression   . Diarrhea 07/14/2008   Qualifier: Diagnosis of  By: Glori Bickers MD, Carmell Austria   . Disorder of vocal cords   . Essential hypertension   . Fibrocystic breast disease   . Fibromyalgia   . GERD (gastroesophageal reflux disease)   . H/O multiple allergies 10/10/2013  . Hemorrhoids   . Hemorrhoids   . History of diverticulitis of colon   . History of pneumonia 2012  . Insomnia   . Irritable bowel syndrome   . Laryngeal nodule    Dr. Thomasena Edis  . Lung disease 07/14/11  . Migraine without aura, without mention of intractable migraine without mention of status migrainosus   . Mitral regurgitation   . Nonischemic cardiomyopathy ([place])    a.  Recovered LV fxn - prev as low as 25%, now 50-55% by echo 04/2014;  b. 2012 s/p MDT [id number] [place] VR single lead ICD.  Marland Kitchen Osteoarthritis    knees, back, hands, low back,   . Osteoporosis 01/2013   T-2.7 spine (01/2016)  . Pneumonia 2011  . PONV (postoperative nausea and vomiting)   . Pre-syncope   . Pure hypercholesterolemia   . sigmoid diverticulitis with perforation, abscess and fistula 09/30/2013  . Sleep apnea 2010   borderline, inconclusive- /w [place]     Past Surgical History:  Procedure Laterality Date  . ABDOMINAL HYSTERECTOMY  1987   w/BSO  . ANAL RECTAL MANOMETRY N/A 02/02/2016   Procedure: ANO RECTAL MANOMETRY;  Surgeon: Mauri Pole, MD;  Location: [facility];  Service: Endoscopy;  Laterality: N/A;  . APPENDECTOMY    . AUGMENTATION MAMMAPLASTY    . BREAST BIOPSY     "I've had 2-3 bx; all benign"  . breast cystectomies     due to FCBD  . BREAST MASS EXCISION  01/2010   Left-fibrocystic change w/intraductal papilloma, no malignancy  . carotid US  [id number]   [id number] LICA, normal RICA  . [employer] Bunionectomy  11/04/08   Right Great Toe (Dr. Beola Cord)  . COLONOSCOPY  1998   nml (  sigmoid-Gilbert-nol)  . Colonoscopy  01/04/04   polyps, diverticulosis/severe  . COLOSTOMY N/A 10/02/2013   DESCENDING COLOSTOMY;  Surgeon: Imogene Burn. Tsuei, MD  . Derrill Memo REVERSAL N/A 02/10/2014   [name], MD  . CT chest  12/2010   possible bronchiectasis lower lobes, 02/2011 - enlarged bilateral effusions, bibasilar atx  . CT chest  04/2011   no PE.  abnormal R hilar and mediastinal adenopathy --> on rpt 05/2011, resolved  . DEXA  03/28/99   osteopenia L/S  . DEXA  03/12/03   2.2 Spine 0.1 Hip  Osteopenia  . DEXA  11/14/05   1.9 Spine 0.3 Hip  slight improvement  . DEXA  11/22/06   improved osteo and fem neck  . DEXA  01/2013   T score -2.6 at spine  . EMG/MCV  10/16/01   + mild carpal tunnel  . ESOPHAGOGASTRODUODENOSCOPY  09/2014   small HH, irregular Z line, fundic gland  polyps with mild gastritis ([place])  . Flex laryngoscopy  06/11/06   ([place]) nml  . Head MRI, Head CT  10/1998   ? H/A focus ([place])  . ICD placement  07/14/11   w/pacemaker  . IMPLANTABLE CARDIOVERTER DEFIBRILLATOR IMPLANT N/A 07/14/2011   IMPLANTABLE CARDIOVERTER DEFIBRILLATOR IMPLANT;  Surgeon: Evans Lance, MD;  Location: [facility] CATH LAB;  Service: Cardiovascular;  Laterality: N/A;  . KNEE ARTHROSCOPY W/ ORIF     Left; "meniscus tear"  . KNEE SURGERY     left  . L/S films  11/21/01   nml; Right hip nml  . LASIK     left eye  . MANDIBLE SURGERY     "got about 6 pins in the bottom of my jaw"  . MANDIBLE SURGERY    . MRI Right Hip  12/12/01   Tendonitis Gluteus Medius to Gtr Trochanter Neg Bursitis (Dr. Eulas Post)  . MRI U/S  11/21/01   min. DDD L/3-4  . NCS  11/2014   L ulnar neuropathy, mild B median nerve entrapment ([place])  . OSTEOTOMY     Left foot  . PARTIAL COLECTOMY N/A 10/02/2013   PARTIAL COLECTOMY;  Surgeon: Imogene Burn. Tsuei, MD  . sinus surgery     x3 with balloon   . stress myoview  02/2011   no significant ischemia  . TOE AMPUTATION     "toe beside baby toe on left foot; got gangrene from corn"  . TONSILLECTOMY AND ADENOIDECTOMY    . US ECHOCARDIOGRAPHY  04/2011   mildly dilated LV, EF 25-30%, hypokinesis, restrictive physiology, mod MR, no AS    There were no vitals filed for this visit.       Subjective Assessment - 02/21/16 1142    Subjective Today I had soft poop and makes me feel uncomfortable.  My bowels go from soft to hard to constipation.  I had 5 abcesses and the 5th one took 6 inches of my colon out.  I had a cholostomy bag. My open wound had to be packed for 2.5 months.  I had another surgery in 04/2015. I had another infection that had to be open and packed.  I have constant pain in abdominal area.  I am sore internally.  Obgyn said I was dry and thin.  MD said my rectal muscles are week. I have used Mirilax and Fiber.  I am not taking Mirilax anymore.  MD hoping for patient to be 50% better. Bowels go from dark to yellow   How long can you sit comfortably?  Increases pain   How long can you stand comfortably? increases pain   How long can you walk comfortably? painful   Patient Stated Goals be able to bend for housework and gardening; not have constipation that I have to remove bowel manually; no having a blow out with bowels   Currently in Pain? Yes   Pain Score 2   worse 8/10   Pain Location Abdomen   Pain Orientation Lower   Pain Descriptors / Indicators Burning  pulling, hot, pulling scab off a sore   Pain Type Chronic pain   Pain Onset More than a month ago   Pain Frequency Constant   Aggravating Factors  sitting, standing, walking, bending   Pain Relieving Factors lay on back   Multiple Pain Sites No            [facility] PT Assessment - 02/21/16 0001      Assessment   Medical Diagnosis R15.9 Fecal incontinence   Referring Provider Dr. Harl Bowie   Onset Date/Surgical Date 10/02/13   Prior Therapy None     Precautions   Precautions ICD/Pacemaker     Balance Screen   Has the patient fallen in the past 6 months Yes  stomach is painful and throws her off balance   How many times? 2   Has the patient had a decrease in activity level because of a fear of falling?  No   Is the patient reluctant to leave their home because of a fear of falling?  No     Prior Function   Level of Independence Independent   Vocation Retired     Charity fundraiser Status Within Functional Limits for tasks assessed     Observation/Other Assessments   Focus on Therapeutic Outcomes (FOTO)  45% limitation  goal is 43% limitation     ROM / Strength   AROM / PROM / Strength AROM;Strength;PROM     AROM   Lumbar - Right Side Bend decreased by 25%     PROM   Right Hip External Rotation  40   Left Hip External Rotation  30   Left Hip ADduction 15     Strength   Right Hip Flexion 3+/5   Right Hip Extension 2-/5   Right  Hip External Rotation  4/5   Right Hip ABduction 3+/5   Right Hip ADduction 2-/5   Left Hip Flexion 4-/5   Left Hip Extension 3/5   Left Hip External Rotation 4/5   Left Hip ABduction 3/5   Left Hip ADduction 3/5     Right Hip   Right Hip ADduction 15     Palpation   SI assessment  left ilium rotated posteriorly   Palpation comment scars on abdominal area have decreased mobility; tenderness located in left pelvic floor muscles and lumbar paraspinals,      Balance   Balance Assessed --  unable to stand on bil. feet due to decreased balance     Standardized Balance Assessment   Five times sit to stand comments  21 sec  pain in lower abdominal     Timed Up and Go Test   TUG Normal TUG   Normal TUG (seconds) 12                 Pelvic Floor Special Questions - 02/21/16 0001    Currently Sexually Active No   Urinary Leakage Yes   Activities that cause leaking With strong urge  has to wait  several seconds for drops to come out    Fecal incontinence Yes  1-3 times per month, sporadic   Exam Type Deferred          [facility] Adult PT Treatment/Exercise - 02/21/16 0001      Manual Therapy   Manual Therapy Myofascial release;Soft tissue mobilization;Manual Lymphatic Drainage (MLD)   Soft tissue mobilization abdominal massage to promote peristatlic motion of colon; bil. diaphgram to improve mobility   Myofascial Release respiratiory and urogenital diaphgram mobilizaiton; pulling up on the rectus abdominus; release of the left lower abdominal tissue   Manual Lymphatic Drainage (MLD) to abdomen to improve lymphatic drainage                PT Education - 02/21/16 1304    Education provided Yes   Education Details abdominal massage   Person(s) Educated Patient   Methods Explanation;Demonstration;Handout   Comprehension Returned demonstration;Verbalized understanding          PT Short Term Goals - 02/21/16 1318      PT SHORT TERM GOAL #1   Title  independent with initial HEP   Time 4   Period Weeks   Status New     PT SHORT TERM GOAL #2   Title understand how to perform abdominal massage to assist in perastalic motion of intestines   Time 4   Status New     PT SHORT TERM GOAL #3   Title understand correct toileting techinque to have a bowel movement to reduce constipation   Time 4   Period Weeks   Status New     PT SHORT TERM GOAL #4   Title pain with daily activities decreased >/= 25%   Time 4   Period Weeks   Status New           PT Long Term Goals - 02/21/16 1319      PT LONG TERM GOAL #1   Title independent with HEP and understand how to progress herselft   Time 8   Period Weeks   Status New     PT LONG TERM GOAL #2   Title fecal leakage decreased >/= 50% due to increased in pelvic floor strength and coordination   Time 8   Period Weeks   Status New     PT LONG TERM GOAL #3   Title pain with daily activities decreased >/= 50% due to improved mobility of tissue    Time 8   Period Weeks   Status New     PT LONG TERM GOAL #4   Title ability to bend forward with reduction in abdominal pain >/= 50% due to decreased swelling in abdomen and reduction in trigger points   Time 8   Period Weeks   Status New     PT LONG TERM GOAL #5   Title foto score </= 43% limitation   Time 8   Period Weeks   Status New     Additional Long Term Goals   Additional Long Term Goals Yes     PT LONG TERM GOAL #6   Title sit to stand 5 times test </= 12 sec due to increased strength and reduction in pain to improve balance   Time 8   Period Weeks   Status New               Plan - 02/21/16 1305    Clinical Impression Statement Patient is a 70 year old female with diagnosis of fecal incontinence  that started 2 years ago after multiple abdominal surgeries. Patient reports a constant abdominal pain at level 2/10 and intermitent pain at level 7/10. Patient reports her pain level is worse with sitting, standing,  walking, sit to stand and back, and bending.  Patient has reduction with laying supine. Patient has no abdominal strength or tone.  Patient has multiple scars on the abdomen from cholostomy bag and surgery to her colon. Scars have reduction in mobility. Swelling is noted in the abdomen.  Bilateral hip strength is averaging 2-4/5 strength.  Decreased PROM for bil. hip abduction and external rotation. Left ilium is rotated posteriorly. Palpable tenderness throughout the abdomen, bil. quads, hip adductors, hamstring and left pelvic floor muscles. Patient bowel movements range from diahrrea to constipation she has to assist in evacuation of the stool.  Patient reports she has to wait while on the commode for the urine to come out. Patient reports fecal leakage 2-3 times per month. Patient reports urinary leakage when she has a strong urge to urinate.   Patient is moderately complex evaluation due to an evolving condition and comorbidities such as pacemaker, fibromyalgia, S/P multiple abdominal surgeries, diverticulitis that will impact care provided. Patient will benefit form skilled therapy to improve overall strength, reduce fecal and urinary leakage and reduce pain.    Rehab Potential Good   Clinical Impairments Affecting Rehab Potential Pacemaker; abdominal hernia with no repair   PT Frequency 1x / week   PT Duration 8 weeks   PT Treatment/Interventions Biofeedback;Moist Heat;Therapeutic activities;Therapeutic exercise;Balance training;Neuromuscular re-education;Patient/family education;Passive range of motion;Scar mobilization;Manual techniques;Dry needling   PT Next Visit Plan toileting technique; abdominal stretches; abdominal bracing; diaphragmatic breathing; bowel health   PT Home Exercise Plan progress as needed   Recommended Other Services none   Consulted and Agree with Plan of Care Patient      Patient will benefit from skilled therapeutic intervention in order to improve the following deficits  and impairments:  Decreased range of motion, Increased fascial restricitons, Decreased endurance, Increased muscle spasms, Pain, Decreased activity tolerance, Impaired flexibility, Decreased strength, Decreased mobility, Decreased balance, Increased edema  Visit Diagnosis: Muscle weakness (generalized) - Plan: PT plan of care cert/re-cert  Unspecified lack of coordination - Plan: PT plan of care cert/re-cert  Other muscle spasm - Plan: PT plan of care cert/re-cert      G-Codes - [id number]    Functional Assessment Tool Used FOTO score is 45% limitation   Functional Limitation Other PT primary   Other PT Primary Current Status [id number]) At least 40 percent but less than 60 percent impaired, limited or restricted   Other PT Primary Goal Status [id number]) At least 40 percent but less than 60 percent impaired, limited or restricted       Problem List Patient Active Problem List   Diagnosis Date Noted  . Bladder wall thickening 02/16/2016  . Fecal incontinence   . Change in bowel habits 11/22/2015  . Left sided abdominal pain 11/22/2015  . History of diverticular abscess 11/22/2015  . Abdominal aortic atherosclerosis ([place]) 11/13/2015  . Acute sinusitis 10/13/2015  . Acute renal failure ([place]) 09/27/2015  . Abdominal discomfort 08/30/2015  . 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  . BRBPR (bright red blood per rectum) 08/30/2015  . Hoarseness 06/21/2015  . Diarrhea 06/21/2015  . Hypersensitivity to pneumococcal vaccine 06/10/2015  . Medicare annual wellness visit, subsequent 06/08/2015  . Advanced care planning/counseling discussion 06/08/2015  . Intertrigo 04/06/2015  . Chronic daily headache 04/06/2015  . Neuropathy ([place]) 04/06/2015  .  Paresthesia of bilateral legs 04/06/2015  . Pre-syncope 02/23/2015  . Essential hypertension   . Nonischemic cardiomyopathy ([place])   . Chest pain and shortness of breath 12/25/2014  . S/P colostomy takedown 02/10/2014  . ICD (implantable  cardioverter-defibrillator) in place 10/23/2013  . H/O multiple allergies 10/10/2013  . Sigmoid diverticulitis with abscess, perforation and fistula formation 09/25/2013  . Osteoporosis 01/13/2013  . Irregular heart beat 08/06/2012  . Depression 07/09/2012  . Chronic recurrent sinusitis 06/17/2012  . Vitamin D deficiency 03/05/2012  . CKD (chronic kidney disease) stage 3, GFR 30-59 ml/min 11/09/2011  . Epistaxis, recurrent 10/11/2011  . Orthostatic hypotension 04/30/2011  . Cardiomyopathy, dilated, nonischemic ([place]) 03/01/2011  . Mitral regurgitation 02/24/2011  . Chronic systolic CHF (congestive heart failure) ([place]) 02/24/2011  . Cough 01/20/2011  . Chronic fatigue and malaise 11/02/2010  . Anxiety state 08/18/2010  . 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  . HYPERCHOLESTEROLEMIA, PURE 11/22/2006  . COMMON MIGRAINE 11/22/2006  . DIVERTICULOSIS, COLON 11/22/2006  . Irritable bowel syndrome 11/22/2006  . FIBROCYSTIC BREAST DISEASE 11/22/2006  . Osteoarthritis 11/22/2006  . Fibromyalgia 11/22/2006    Earlie Counts, PT 02/21/16 1:26 PM   [facility] [address], [place], [place], [address] Phone: [phone]   Fax:  [phone]  Name: Ashley [name] MRN: [id number] Date of Birth: 07-05-1945

## 2016-02-22 ENCOUNTER — Telehealth: Payer: Self-pay | Admitting: Cardiovascular Disease

## 2016-02-22 NOTE — Telephone Encounter (Signed)
Better option would be to see what medications they offer and we can then make a decision Typically most of their medications should be fine

## 2016-02-22 NOTE — Telephone Encounter (Signed)
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 She has a consultation regarding this on Monday. She would like advise on what medication she can take for this that will not affect her heart medication. Please call and advise.

## 2016-02-23 NOTE — Telephone Encounter (Signed)
Spoke with patient and reviewed Dr. Donivan Scull recommendations to just wait and see what they recommend and she verbalized understanding and had no further questions at this time.

## 2016-02-24 LAB — CUP PACEART REMOTE DEVICE CHECK
Battery Voltage: 3.03 V
Brady Statistic RV Percent Paced: 0 %
Date Time Interrogation Session: 20171005212612
HighPow Impedance: 85 Ohm
Implantable Lead Implant Date: 20130301
Implantable Lead Location: 753860
Implantable Lead Model: 6935
Lead Channel Impedance Value: 627 Ohm
Lead Channel Pacing Threshold Amplitude: 0.625 V
Lead Channel Pacing Threshold Pulse Width: 0.4 ms
Lead Channel Sensing Intrinsic Amplitude: 6.125 mV
Lead Channel Sensing Intrinsic Amplitude: 6.125 mV
Lead Channel Setting Pacing Amplitude: 2.5 V
Lead Channel Setting Pacing Pulse Width: 0.4 ms
Lead Channel Setting Sensing Sensitivity: 0.3 mV

## 2016-02-28 DIAGNOSIS — M810 Age-related osteoporosis without current pathological fracture: Secondary | ICD-10-CM | POA: Diagnosis not present

## 2016-03-01 ENCOUNTER — Encounter: Payer: Self-pay | Admitting: Physical Therapy

## 2016-03-01 ENCOUNTER — Ambulatory Visit: Payer: Medicare Other | Admitting: Physical Therapy

## 2016-03-01 DIAGNOSIS — R279 Unspecified lack of coordination: Secondary | ICD-10-CM | POA: Diagnosis not present

## 2016-03-01 DIAGNOSIS — M6281 Muscle weakness (generalized): Secondary | ICD-10-CM | POA: Diagnosis not present

## 2016-03-01 DIAGNOSIS — M62838 Other muscle spasm: Secondary | ICD-10-CM

## 2016-03-01 NOTE — Therapy (Signed)
[facility] [address] [place], [place], [address] Phone: [phone]   Fax:  [phone]  Physical Therapy Treatment  Patient Details  Name: Ashley [name] MRN: [id number] Date of Birth: [birth year] Referring Provider: Dr. Harl Bowie  Encounter Date: 03/01/2016      PT End of Session - 03/01/16 1445    Visit Number 2   Number of Visits 10   Date for PT Re-Evaluation 04/17/16   Authorization Type medicare g-code on 10th visit   PT Start Time 1400   PT Stop Time 1443   PT Time Calculation (min) 43 min   Activity Tolerance Patient tolerated treatment well   Behavior During Therapy [facility] for tasks assessed/performed      Past Medical History:  Diagnosis Date  . Abdominal aortic atherosclerosis ([place]) 11/2015   by CT  . Allergy   . Anemia   . Anxiety   . Automatic implantable cardioverter-defibrillator in situ 2012   a. MDT single lead ICD.  [place] Kitchen Bronchiectasis    a. possibly mild, treat URIs aggressively  . Cancer (HCC)    basal cell, arms & neck  . Chest pain    a. prev neg MV.  . Chronic sinusitis   . Chronic systolic CHF (congestive heart failure) (HCC)    a. EF 25-35%, LV dilation, significant MR, goal weight 150lbs;  b. 04/2014 Echo: EF 50-55%, Gr 1 DD.  [place] Kitchen Depression   . Diarrhea 07/14/2008   Qualifier: Diagnosis of  By: Glori Bickers MD, Carmell Austria   . Disorder of vocal cords   . Essential hypertension   . Fibrocystic breast disease   . Fibromyalgia   . GERD (gastroesophageal reflux disease)   . H/O multiple allergies 10/10/2013  . Hemorrhoids   . Hemorrhoids   . History of diverticulitis of colon   . History of pneumonia 2012  . Insomnia   . Irritable bowel syndrome   . Laryngeal nodule    Dr. Thomasena Edis  . Lung disease 07/14/11  . Migraine without aura, without mention of intractable migraine without mention of status migrainosus   . Mitral regurgitation   . Nonischemic cardiomyopathy ([place])    a.  Recovered LV fxn - prev as low as 25%, now 50-55% by echo 04/2014;  b. 2012 s/p MDT [id number] [place] VR single lead ICD.  [place] Kitchen Osteoarthritis    knees, back, hands, low back,   . Osteoporosis 01/2013   T-2.7 spine (01/2016)  . Pneumonia 2011  . PONV (postoperative nausea and vomiting)   . Pre-syncope   . Pure hypercholesterolemia   . sigmoid diverticulitis with perforation, abscess and fistula 09/30/2013  . Sleep apnea 2010   borderline, inconclusive- /w [place]     Past Surgical History:  Procedure Laterality Date  . ABDOMINAL HYSTERECTOMY  1987   w/BSO  . ANAL RECTAL MANOMETRY N/A 02/02/2016   Procedure: ANO RECTAL MANOMETRY;  Surgeon: Mauri Pole, MD;  Location: [facility];  Service: Endoscopy;  Laterality: N/A;  . APPENDECTOMY    . AUGMENTATION MAMMAPLASTY    . BREAST BIOPSY     "I've had 2-3 bx; all benign"  . breast cystectomies     due to FCBD  . BREAST MASS EXCISION  01/2010   Left-fibrocystic change w/intraductal papilloma, no malignancy  . carotid US  [id number]   [id number] LICA, normal RICA  . [employer] Bunionectomy  11/04/08   Right Great Toe (Dr. Beola Cord)  . COLONOSCOPY  1998   nml (  sigmoid-Gilbert-nol)  . Colonoscopy  01/04/04   polyps, diverticulosis/severe  . COLOSTOMY N/A 10/02/2013   DESCENDING COLOSTOMY;  Surgeon: Imogene Burn. Tsuei, MD  . Derrill Memo REVERSAL N/A 02/10/2014   [name], MD  . CT chest  12/2010   possible bronchiectasis lower lobes, 02/2011 - enlarged bilateral effusions, bibasilar atx  . CT chest  04/2011   no PE.  abnormal R hilar and mediastinal adenopathy --> on rpt 05/2011, resolved  . DEXA  03/28/99   osteopenia L/S  . DEXA  03/12/03   2.2 Spine 0.1 Hip  Osteopenia  . DEXA  11/14/05   1.9 Spine 0.3 Hip  slight improvement  . DEXA  11/22/06   improved osteo and fem neck  . DEXA  01/2013   T score -2.6 at spine  . EMG/MCV  10/16/01   + mild carpal tunnel  . ESOPHAGOGASTRODUODENOSCOPY  09/2014   small HH, irregular Z line, fundic gland  polyps with mild gastritis ([place])  . Flex laryngoscopy  06/11/06   ([place]) nml  . Head MRI, Head CT  10/1998   ? H/A focus ([place])  . ICD placement  07/14/11   w/pacemaker  . IMPLANTABLE CARDIOVERTER DEFIBRILLATOR IMPLANT N/A 07/14/2011   IMPLANTABLE CARDIOVERTER DEFIBRILLATOR IMPLANT;  Surgeon: Evans Lance, MD;  Location: [facility] CATH LAB;  Service: Cardiovascular;  Laterality: N/A;  . KNEE ARTHROSCOPY W/ ORIF     Left; "meniscus tear"  . KNEE SURGERY     left  . L/S films  11/21/01   nml; Right hip nml  . LASIK     left eye  . MANDIBLE SURGERY     "got about 6 pins in the bottom of my jaw"  . MANDIBLE SURGERY    . MRI Right Hip  12/12/01   Tendonitis Gluteus Medius to Gtr Trochanter Neg Bursitis (Dr. Eulas Post)  . MRI U/S  11/21/01   min. DDD L/3-4  . NCS  11/2014   L ulnar neuropathy, mild B median nerve entrapment ([place])  . OSTEOTOMY     Left foot  . PARTIAL COLECTOMY N/A 10/02/2013   PARTIAL COLECTOMY;  Surgeon: Imogene Burn. Tsuei, MD  . sinus surgery     x3 with balloon   . stress myoview  02/2011   no significant ischemia  . TOE AMPUTATION     "toe beside baby toe on left foot; got gangrene from corn"  . TONSILLECTOMY AND ADENOIDECTOMY    . US ECHOCARDIOGRAPHY  04/2011   mildly dilated LV, EF 25-30%, hypokinesis, restrictive physiology, mod MR, no AS    There were no vitals filed for this visit.      Subjective Assessment - 03/01/16 1404    Subjective I have pain in my back and down left leg.  This week I have constipation and diarrhea.  iget some burning pain in lower abdomen.    How long can you sit comfortably? Increases pain   How long can you stand comfortably? increases pain   How long can you walk comfortably? painful   Patient Stated Goals be able to bend for housework and gardening; not have constipation that I have to remove bowel manually; no having a blow out with bowels   Currently in Pain? Yes   Pain Score 2    Pain Location Abdomen   Pain Orientation  Lower   Pain Descriptors / Indicators Burning   Pain Type Chronic pain   Pain Onset More than a month ago   Pain Frequency Constant  Aggravating Factors  sitting, standing, walking, bending   Pain Relieving Factors lay on back   Multiple Pain Sites Yes   Pain Score 5   Pain Location Back   Pain Orientation Lower;Right   Pain Descriptors / Indicators Sore;Aching   Pain Type Chronic pain   Pain Radiating Towards down right leg   Pain Onset More than a month ago   Pain Frequency Constant   Aggravating Factors  walking, bending   Pain Relieving Factors biofreeze, hot showers                         [facility] Adult PT Treatment/Exercise - 03/01/16 0001      Exercises   Exercises Other Exercises   Other Exercises  abdominal bracing with breathing into hand to contract the abdominals     Manual Therapy   Manual Therapy Myofascial release;Soft tissue mobilization   Soft tissue mobilization abdominal massage to promote peristatlic motion   Myofascial Release urogenital diaphgram mobilizaiton; pulling up on the rectus abdominus; release of the left lower abdominal tissue                PT Education - 03/01/16 1445    Education provided Yes   Education Details toileting technique ;diaphgramatic breathing; bowel health   Person(s) Educated Patient   Methods Explanation;Verbal cues;Handout;Demonstration   Comprehension Verbalized understanding;Returned demonstration          PT Short Term Goals - 03/01/16 1447      PT SHORT TERM GOAL #1   Title independent with initial HEP   Time 4   Period Weeks   Status New     PT SHORT TERM GOAL #2   Title understand how to perform abdominal massage to assist in perastalic motion of intestines   Time 4   Period Weeks   Status Achieved     PT SHORT TERM GOAL #3   Title understand correct toileting techinque to have a bowel movement to reduce constipation   Time 4   Period Weeks   Status On-going     PT SHORT  TERM GOAL #4   Title pain with daily activities decreased >/= 25%   Time 4   Period Weeks   Status On-going           PT Long Term Goals - 02/21/16 1319      PT LONG TERM GOAL #1   Title independent with HEP and understand how to progress herselft   Time 8   Period Weeks   Status New     PT LONG TERM GOAL #2   Title fecal leakage decreased >/= 50% due to increased in pelvic floor strength and coordination   Time 8   Period Weeks   Status New     PT LONG TERM GOAL #3   Title pain with daily activities decreased >/= 50% due to improved mobility of tissue    Time 8   Period Weeks   Status New     PT LONG TERM GOAL #4   Title ability to bend forward with reduction in abdominal pain >/= 50% due to decreased swelling in abdomen and reduction in trigger points   Time 8   Period Weeks   Status New     PT LONG TERM GOAL #5   Title foto score </= 43% limitation   Time 8   Period Weeks   Status New     Additional Long Term Goals  Additional Long Term Goals Yes     PT LONG TERM GOAL #6   Title sit to stand 5 times test </= 12 sec due to increased strength and reduction in pain to improve balance   Time 8   Period Weeks   Status New               Plan - 03/01/16 1445    Clinical Impression Statement Patient has difficulty with contracting her abdominals due to weakness and hernias.  She needs increased verbal and tactile cues to perfrom correctly.  Patient has difficulty with diaphgramatic breathing due to putting air in chest and push the belly out with exhale.  Patient will benefit from skilled therapy to reduce pain and improve strength.    Rehab Potential Good   Clinical Impairments Affecting Rehab Potential Pacemaker; abdominal hernia with no repair   PT Frequency 1x / week   PT Duration 8 weeks   PT Treatment/Interventions Biofeedback;Moist Heat;Therapeutic activities;Therapeutic exercise;Balance training;Neuromuscular re-education;Patient/family  education;Passive range of motion;Scar mobilization;Manual techniques;Dry needling   PT Next Visit Plan ; abdominal bracing; diaphragmatic breathing; soft tissue work   PT Home Exercise Plan progress as needed   Consulted and Agree with Plan of Care Patient      Patient will benefit from skilled therapeutic intervention in order to improve the following deficits and impairments:  Decreased range of motion, Increased fascial restricitons, Decreased endurance, Increased muscle spasms, Pain, Decreased activity tolerance, Impaired flexibility, Decreased strength, Decreased mobility, Decreased balance, Increased edema  Visit Diagnosis: Muscle weakness (generalized)  Unspecified lack of coordination  Other muscle spasm     Problem List Patient Active Problem List   Diagnosis Date Noted  . Bladder wall thickening 02/16/2016  . Fecal incontinence   . Change in bowel habits 11/22/2015  . Left sided abdominal pain 11/22/2015  . History of diverticular abscess 11/22/2015  . Abdominal aortic atherosclerosis ([place]) 11/13/2015  . Acute sinusitis 10/13/2015  . Acute renal failure ([place]) 09/27/2015  . Abdominal discomfort 08/30/2015  . 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  . BRBPR (bright red blood per rectum) 08/30/2015  . Hoarseness 06/21/2015  . Diarrhea 06/21/2015  . Hypersensitivity to pneumococcal vaccine 06/10/2015  . Medicare annual wellness visit, subsequent 06/08/2015  . Advanced care planning/counseling discussion 06/08/2015  . Intertrigo 04/06/2015  . Chronic daily headache 04/06/2015  . Neuropathy ([place]) 04/06/2015  . Paresthesia of bilateral legs 04/06/2015  . Pre-syncope 02/23/2015  . Essential hypertension   . Nonischemic cardiomyopathy ([place])   . Chest pain and shortness of breath 12/25/2014  . S/P colostomy takedown 02/10/2014  . ICD (implantable cardioverter-defibrillator) in place 10/23/2013  . H/O multiple allergies 10/10/2013  . Sigmoid diverticulitis with abscess,  perforation and fistula formation 09/25/2013  . Osteoporosis 01/13/2013  . Irregular heart beat 08/06/2012  . Depression 07/09/2012  . Chronic recurrent sinusitis 06/17/2012  . Vitamin D deficiency 03/05/2012  . CKD (chronic kidney disease) stage 3, GFR 30-59 ml/min 11/09/2011  . Epistaxis, recurrent 10/11/2011  . Orthostatic hypotension 04/30/2011  . Cardiomyopathy, dilated, nonischemic ([place]) 03/01/2011  . Mitral regurgitation 02/24/2011  . Chronic systolic CHF (congestive heart failure) ([place]) 02/24/2011  . Cough 01/20/2011  . Chronic fatigue and malaise 11/02/2010  . Anxiety state 08/18/2010  . 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  . HYPERCHOLESTEROLEMIA, PURE 11/22/2006  . COMMON MIGRAINE 11/22/2006  . DIVERTICULOSIS, COLON 11/22/2006  . Irritable bowel syndrome 11/22/2006  . FIBROCYSTIC BREAST DISEASE 11/22/2006  . Osteoarthritis 11/22/2006  . Fibromyalgia 11/22/2006  Earlie Counts, PT 03/01/16 2:49 PM   [facility] [address], [place], [place], [address] Phone: [phone]   Fax:  [phone]  Name: KITARA BARSTAD MRN: [id number] Date of Birth: 1945-11-28

## 2016-03-01 NOTE — Patient Instructions (Addendum)
Toileting Techniques for Bowel Movements (Defecation) Using your belly (abdomen) and pelvic floor muscles to have a bowel movement is usually instinctive.  Sometimes people can have problems with these muscles and have to relearn proper defecation (emptying) techniques.  If you have weakness in your muscles, organs that are falling out, decreased sensation in your pelvis, or ignore your urge to go, you may find yourself straining to have a bowel movement.  You are straining if you are: . holding your breath or taking in a huge gulp of air and holding it  . keeping your lips and jaw tensed and closed tightly . turning red in the face because of excessive pushing or forcing . developing or worsening your  hemorrhoids . getting faint while pushing . not emptying completely and have to defecate many times a day  If you are straining, you are actually making it harder for yourself to have a bowel movement.  Many people find they are pulling up with the pelvic floor muscles and closing off instead of opening the anus. Due to lack pelvic floor relaxation and coordination the abdominal muscles, one has to work harder to push the feces out.  Many people have never been taught how to defecate efficiently and effectively.  Notice what happens to your body when you are having a bowel movement.  While you are sitting on the toilet pay attention to the following areas: . Jaw and mouth position . Angle of your hips   . Whether your feet touch the ground or not . Arm placement  . Spine position . Waist . Belly tension . Anus (opening of the anal canal)  An Evacuation/Defecation Plan   Here are the 4 basic points:  1. Lean forward enough for your elbows to rest on your knees 2. Support your feet on the floor or use a low stool if your feet don't touch the floor  3. Push out your belly as if you have swallowed a beach ball-you should feel a widening of your waist 4. Open and relax your pelvic floor muscles,  rather than tightening around the anus      The following conditions my require modifications to your toileting posture:  . If you have had surgery in the past that limits your back, hip, pelvic, knee or ankle flexibility . Constipation   Your healthcare practitioner may make the following additional suggestions and adjustments:  1) Sit on the toilet  a) Make sure your feet are supported. b) Notice your hip angle and spine position-most people find it effective to lean forward or raise their knees, which can help the muscles around the anus to relax  c) When you lean forward, place your forearms on your thighs for support  2) Relax suggestions a) Breath deeply in through your nose and out slowly through your mouth as if you are smelling the flowers and blowing out the candles. b) To become aware of how to relax your muscles, contracting and releasing muscles can be helpful.  Pull your pelvic floor muscles in tightly by using the image of holding back gas, or closing around the anus (visualize making a circle smaller) and lifting the anus up and in.  Then release the muscles and your anus should drop down and feel open. Repeat 5 times ending with the feeling of relaxation. c) Keep your pelvic floor muscles relaxed; let your belly bulge out. d) The digestive tract starts at the mouth and ends at the anal opening, so be   sure to relax both ends of the tube.  Place your tongue on the roof of your mouth with your teeth separated.  This helps relax your mouth and will help to relax the anus at the same time.  3) Empty (defecation) a) Keep your pelvic floor and sphincter relaxed, then bulge your anal muscles.  Make the anal opening wide.  b) Stick your belly out as if you have swallowed a beach ball. c) Make your belly wall hard using your belly muscles while continuing to breathe. Doing this makes it easier to open your anus. d) Breath out and give a grunt (or try using other sounds such as  ahhhh, shhhhh, ohhhh or grrrrrrr).  4) Finish a) As you finish your bowel movement, pull the pelvic floor muscles up and in.  This will leave your anus in the proper place rather than remaining pushed out and down. If you leave your anus pushed out and down, it will start to feel as though that is normal and give you incorrect signals about needing to have a bowel movement.    Earlie Counts, PT [facility] [address] [place], [place] [address] Sitting    Sit comfortably. Allow body's muscles to relax. Place hands on belly. Inhale slowly and deeply for 3___ seconds, so hands move out. Then take _3__ seconds to exhale. Repeat _5__ times. Do __3_ times a day.  Copyright  VHI. All rights reserved.   Introduction to Bowel Health Diet and daily habits can help you predict when your bowels will move on a regular basis.  The consistency and quantity of the stool is usually more important than the frequency.  The goal is to have a regular bowel movement that is soft but formed.   Tips on Emptying Regularly . Eat breakfast.  Usually the best time of day for a bowel movement will be a half hour to an hour after eating.  These times are best because the body uses the gastrocolic reflex, a stimulation of bowel motion that occurs with eating, to help produce a bowel movement.  For some people even a simple hot drink in the morning can help the reflex action begin. . Eat all your meals at a predictable time each day.  The bowel functions best when food is introduced at the same regular intervals. . The amount of food eaten at a given time of day should be about the same size from day to day.  The bowel functions best when food is introduced in similar quantities from day to day. It is fine to have a small breakfast and a large lunch, or vice versa, just be consistent. . Eat two servings of fruit or vegetables and at least one serving of a complex carbohydrates (whole  grains such as brown rice, bran, whole wheat bread, or oatmeal) at each meal. . Drink plenty of water-ideally eight glasses a day.  Be sure to increase your water intake if you are increasing fiber into your diet.  Maintain Healthy Habits . Exercise daily.  You may exercise at any time of day, but you may find that bowel function is helped most if the exercise is at a consistent time each day. . Make sure that you are not rushed and have convenient access to a bathroom at your selected time to empty your bowels.   [place] [place] [address] [place], [place] [address] Phone # [phone] Fax [phone]

## 2016-03-03 ENCOUNTER — Encounter: Payer: Self-pay | Admitting: Cardiology

## 2016-03-08 ENCOUNTER — Ambulatory Visit: Payer: Medicare Other | Admitting: Physical Therapy

## 2016-03-08 ENCOUNTER — Encounter: Payer: Self-pay | Admitting: Physical Therapy

## 2016-03-08 DIAGNOSIS — M6281 Muscle weakness (generalized): Secondary | ICD-10-CM

## 2016-03-08 DIAGNOSIS — R279 Unspecified lack of coordination: Secondary | ICD-10-CM

## 2016-03-08 DIAGNOSIS — M62838 Other muscle spasm: Secondary | ICD-10-CM | POA: Diagnosis not present

## 2016-03-08 NOTE — Therapy (Signed)
[facility] [address], [place], [place], [address] Phone: [phone]   Fax:  [phone]  Physical Therapy Treatment  Patient Details  Name: Ashley [name] MRN: [id number] Date of Birth: [birth year] Referring Provider: Dr. Harl Bowie  Encounter Date: 03/08/2016      PT End of Session - 03/08/16 1442    Visit Number 3   Number of Visits 10   Date for PT Re-Evaluation 04/17/16   Authorization Type medicare g-code on 10th visit   PT Start Time 1400   PT Stop Time 1442   PT Time Calculation (min) 42 min   Activity Tolerance Patient tolerated treatment well   Behavior During Therapy [facility] for tasks assessed/performed      Past Medical History:  Diagnosis Date  . Abdominal aortic atherosclerosis ([place]) 11/2015   by CT  . Allergy   . Anemia   . Anxiety   . Automatic implantable cardioverter-defibrillator in situ 2012   a. MDT single lead ICD.  Marland Kitchen Bronchiectasis    a. possibly mild, treat URIs aggressively  . Cancer (HCC)    basal cell, arms & neck  . Chest pain    a. prev neg MV.  . Chronic sinusitis   . Chronic systolic CHF (congestive heart failure) (HCC)    a. EF 25-35%, LV dilation, significant MR, goal weight 150lbs;  b. 04/2014 Echo: EF 50-55%, Gr 1 DD.  Marland Kitchen Depression   . Diarrhea 07/14/2008   Qualifier: Diagnosis of  By: Glori Bickers MD, Carmell Austria   . Disorder of vocal cords   . Essential hypertension   . Fibrocystic breast disease   . Fibromyalgia   . GERD (gastroesophageal reflux disease)   . H/O multiple allergies 10/10/2013  . Hemorrhoids   . Hemorrhoids   . History of diverticulitis of colon   . History of pneumonia 2012  . Insomnia   . Irritable bowel syndrome   . Laryngeal nodule    Dr. Thomasena Edis  . Lung disease 07/14/11  . Migraine without aura, without mention of intractable migraine without mention of status migrainosus   . Mitral regurgitation   . Nonischemic cardiomyopathy ([place])    a.  Recovered LV fxn - prev as low as 25%, now 50-55% by echo 04/2014;  b. 2012 s/p MDT [id number] [place] VR single lead ICD.  Marland Kitchen Osteoarthritis    knees, back, hands, low back,   . Osteoporosis 01/2013   T-2.7 spine (01/2016)  . Pneumonia 2011  . PONV (postoperative nausea and vomiting)   . Pre-syncope   . Pure hypercholesterolemia   . sigmoid diverticulitis with perforation, abscess and fistula 09/30/2013  . Sleep apnea 2010   borderline, inconclusive- /w [place]     Past Surgical History:  Procedure Laterality Date  . ABDOMINAL HYSTERECTOMY  1987   w/BSO  . ANAL RECTAL MANOMETRY N/A 02/02/2016   Procedure: ANO RECTAL MANOMETRY;  Surgeon: Mauri Pole, MD;  Location: [facility];  Service: Endoscopy;  Laterality: N/A;  . APPENDECTOMY    . AUGMENTATION MAMMAPLASTY    . BREAST BIOPSY     "I've had 2-3 bx; all benign"  . breast cystectomies     due to FCBD  . BREAST MASS EXCISION  01/2010   Left-fibrocystic change w/intraductal papilloma, no malignancy  . carotid US  44/8185   6-31% LICA, normal RICA  . [place] Bunionectomy  11/04/08   Right Great Toe (Dr. Beola Cord)  . COLONOSCOPY  1998   nml (  sigmoid-Gilbert-nol)  . Colonoscopy  01/04/04   polyps, diverticulosis/severe  . COLOSTOMY N/A 10/02/2013   DESCENDING COLOSTOMY;  Surgeon: Imogene Burn. Tsuei, MD  . Derrill Memo REVERSAL N/A 02/10/2014   [name], MD  . CT chest  12/2010   possible bronchiectasis lower lobes, 02/2011 - enlarged bilateral effusions, bibasilar atx  . CT chest  04/2011   no PE.  abnormal R hilar and mediastinal adenopathy --> on rpt 05/2011, resolved  . DEXA  03/28/99   osteopenia L/S  . DEXA  03/12/03   2.2 Spine 0.1 Hip  Osteopenia  . DEXA  11/14/05   1.9 Spine 0.3 Hip  slight improvement  . DEXA  11/22/06   improved osteo and fem neck  . DEXA  01/2013   T score -2.6 at spine  . EMG/MCV  10/16/01   + mild carpal tunnel  . ESOPHAGOGASTRODUODENOSCOPY  09/2014   small HH, irregular Z line, fundic gland  polyps with mild gastritis ([place])  . Flex laryngoscopy  06/11/06   ([place]) nml  . Head MRI, Head CT  10/1998   ? H/A focus ([place])  . ICD placement  07/14/11   w/pacemaker  . IMPLANTABLE CARDIOVERTER DEFIBRILLATOR IMPLANT N/A 07/14/2011   IMPLANTABLE CARDIOVERTER DEFIBRILLATOR IMPLANT;  Surgeon: Evans Lance, MD;  Location: [facility] CATH LAB;  Service: Cardiovascular;  Laterality: N/A;  . KNEE ARTHROSCOPY W/ ORIF     Left; "meniscus tear"  . KNEE SURGERY     left  . L/S films  11/21/01   nml; Right hip nml  . LASIK     left eye  . MANDIBLE SURGERY     "got about 6 pins in the bottom of my jaw"  . MANDIBLE SURGERY    . MRI Right Hip  12/12/01   Tendonitis Gluteus Medius to Gtr Trochanter Neg Bursitis (Dr. Eulas Post)  . MRI U/S  11/21/01   min. DDD L/3-4  . NCS  11/2014   L ulnar neuropathy, mild B median nerve entrapment ([place])  . OSTEOTOMY     Left foot  . PARTIAL COLECTOMY N/A 10/02/2013   PARTIAL COLECTOMY;  Surgeon: Imogene Burn. Tsuei, MD  . sinus surgery     x3 with balloon   . stress myoview  02/2011   no significant ischemia  . TOE AMPUTATION     "toe beside baby toe on left foot; got gangrene from corn"  . TONSILLECTOMY AND ADENOIDECTOMY    . US ECHOCARDIOGRAPHY  04/2011   mildly dilated LV, EF 25-30%, hypokinesis, restrictive physiology, mod MR, no AS    There were no vitals filed for this visit.      Subjective Assessment - 03/08/16 1404    Subjective I have not had a blow out yet  and no constipation. I have to strain to have my poop and it is not diahrrea.    How long can you sit comfortably? Increases pain   How long can you stand comfortably? increases pain   How long can you walk comfortably? painful   Patient Stated Goals be able to bend for housework and gardening; not have constipation that I have to remove bowel manually; no having a blow out with bowels   Currently in Pain? No/denies   Multiple Pain Sites No            [facility] PT Assessment - 03/08/16  0001      Precautions   Precaution Comments osteoporosis  [facility] Adult PT Treatment/Exercise - 03/08/16 0001      Exercises   Other Exercises  diaphgramatient breathing in supine using a towel and hands for tactile cues     Manual Therapy   Manual Therapy Myofascial release;Soft tissue mobilization   Manual therapy comments scar massage with assistive device to lift up the skin and release the fascia   Soft tissue mobilization abdominal massage to promote peristatlic motion                PT Education - 03/08/16 1442    Education provided Yes   Education Details hip strengthening in standing   Person(s) Educated Patient   Methods Explanation;Demonstration;Handout   Comprehension Verbalized understanding;Returned demonstration          PT Short Term Goals - 03/08/16 1406      PT SHORT TERM GOAL #1   Title independent with initial HEP   Time 4   Period Weeks   Status Achieved     PT SHORT TERM GOAL #2   Title understand how to perform abdominal massage to assist in perastalic motion of intestines   Time 4   Period Weeks   Status Achieved     PT SHORT TERM GOAL #3   Title understand correct toileting techinque to have a bowel movement to reduce constipation   Time 4   Period Weeks   Status Achieved     PT SHORT TERM GOAL #4   Title pain with daily activities decreased >/= 25%   Time 4   Period Weeks   Status Achieved           PT Long Term Goals - 02/21/16 1319      PT LONG TERM GOAL #1   Title independent with HEP and understand how to progress herselft   Time 8   Period Weeks   Status New     PT LONG TERM GOAL #2   Title fecal leakage decreased >/= 50% due to increased in pelvic floor strength and coordination   Time 8   Period Weeks   Status New     PT LONG TERM GOAL #3   Title pain with daily activities decreased >/= 50% due to improved mobility of tissue    Time 8   Period Weeks   Status New      PT LONG TERM GOAL #4   Title ability to bend forward with reduction in abdominal pain >/= 50% due to decreased swelling in abdomen and reduction in trigger points   Time 8   Period Weeks   Status New     PT LONG TERM GOAL #5   Title foto score </= 43% limitation   Time 8   Period Weeks   Status New     Additional Long Term Goals   Additional Long Term Goals Yes     PT LONG TERM GOAL #6   Title sit to stand 5 times test </= 12 sec due to increased strength and reduction in pain to improve balance   Time 8   Period Weeks   Status New               Plan - 03/08/16 1443    Clinical Impression Statement Patient still has difficulty with performing diaphgramatic breathing.  She does better with only breathing through her mouth.  Patient has increased mobility of abdominal scar. Patient has tender points in left lower quadrant.  Patient has met her STG's.  Patient  reports her pain has decreased by 25%.  Patient will benefit from skilled therapy to reduce pain and improve strength.    Rehab Potential Good   Clinical Impairments Affecting Rehab Potential Pacemaker; abdominal hernia with no repair; osteoporosis   PT Frequency 1x / week   PT Duration 8 weeks   PT Treatment/Interventions Biofeedback;Moist Heat;Therapeutic activities;Therapeutic exercise;Balance training;Neuromuscular re-education;Patient/family education;Passive range of motion;Scar mobilization;Manual techniques;Dry needling   PT Next Visit Plan ; abdominal bracing; diaphragmatic breathing; soft tissue work; work on pelvic floor contraction   PT Home Exercise Plan progress as needed   Consulted and Agree with Plan of Care Patient      Patient will benefit from skilled therapeutic intervention in order to improve the following deficits and impairments:  Decreased range of motion, Increased fascial restricitons, Decreased endurance, Increased muscle spasms, Pain, Decreased activity tolerance, Impaired flexibility,  Decreased strength, Decreased mobility, Decreased balance, Increased edema  Visit Diagnosis: Muscle weakness (generalized)  Unspecified lack of coordination  Other muscle spasm     Problem List Patient Active Problem List   Diagnosis Date Noted  . Bladder wall thickening 02/16/2016  . Fecal incontinence   . Change in bowel habits 11/22/2015  . Left sided abdominal pain 11/22/2015  . History of diverticular abscess 11/22/2015  . Abdominal aortic atherosclerosis ([place]) 11/13/2015  . Acute sinusitis 10/13/2015  . Acute renal failure ([place]) 09/27/2015  . Abdominal discomfort 08/30/2015  . 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  . BRBPR (bright red blood per rectum) 08/30/2015  . Hoarseness 06/21/2015  . Diarrhea 06/21/2015  . Hypersensitivity to pneumococcal vaccine 06/10/2015  . Medicare annual wellness visit, subsequent 06/08/2015  . Advanced care planning/counseling discussion 06/08/2015  . Intertrigo 04/06/2015  . Chronic daily headache 04/06/2015  . Neuropathy ([place]) 04/06/2015  . Paresthesia of bilateral legs 04/06/2015  . Pre-syncope 02/23/2015  . Essential hypertension   . Nonischemic cardiomyopathy ([place])   . Chest pain and shortness of breath 12/25/2014  . S/P colostomy takedown 02/10/2014  . ICD (implantable cardioverter-defibrillator) in place 10/23/2013  . H/O multiple allergies 10/10/2013  . Sigmoid diverticulitis with abscess, perforation and fistula formation 09/25/2013  . Osteoporosis 01/13/2013  . Irregular heart beat 08/06/2012  . Depression 07/09/2012  . Chronic recurrent sinusitis 06/17/2012  . Vitamin D deficiency 03/05/2012  . CKD (chronic kidney disease) stage 3, GFR 30-59 ml/min 11/09/2011  . Epistaxis, recurrent 10/11/2011  . Orthostatic hypotension 04/30/2011  . Cardiomyopathy, dilated, nonischemic ([place]) 03/01/2011  . Mitral regurgitation 02/24/2011  . Chronic systolic CHF (congestive heart failure) ([place]) 02/24/2011  . Cough 01/20/2011  . Chronic fatigue  and malaise 11/02/2010  . Anxiety state 08/18/2010  . 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  . HYPERCHOLESTEROLEMIA, PURE 11/22/2006  . COMMON MIGRAINE 11/22/2006  . DIVERTICULOSIS, COLON 11/22/2006  . Irritable bowel syndrome 11/22/2006  . FIBROCYSTIC BREAST DISEASE 11/22/2006  . Osteoarthritis 11/22/2006  . Fibromyalgia 11/22/2006    Earlie Counts, PT 03/08/16 2:47 PM   [facility] [address] [place], [place], [address] Phone: [phone]   Fax:  [phone]  Name: IVANELL DESHOTEL MRN: [id number] Date of Birth: [birth year]

## 2016-03-08 NOTE — Patient Instructions (Addendum)
ABDUCTION: Standing (Active)    Stand, feet flat. Lift right leg out to side.  Complete _10__ sets of _1__ repetitions. Perform _1__ sessions per day.  http://gtsc.exer.us/110   Copyright  VHI. All rights reserved.  EXTENSION: Standing (Active)    Stand, both feet flat. Draw right leg behind body as far as possible.  Complete _1__ sets of _10__ repetitions. Perform _1__ sessions per day.  http://gtsc.exer.us/76   Copyright  VHI. All rights reserved.  [facility] [address] [place], [place] [address] Phone # [phone] Fax [phone]

## 2016-03-22 ENCOUNTER — Encounter: Payer: Self-pay | Admitting: Physical Therapy

## 2016-03-22 ENCOUNTER — Ambulatory Visit: Payer: Medicare Other | Attending: Gastroenterology | Admitting: Physical Therapy

## 2016-03-22 DIAGNOSIS — M6281 Muscle weakness (generalized): Secondary | ICD-10-CM | POA: Diagnosis not present

## 2016-03-22 DIAGNOSIS — R279 Unspecified lack of coordination: Secondary | ICD-10-CM | POA: Diagnosis not present

## 2016-03-22 DIAGNOSIS — M62838 Other muscle spasm: Secondary | ICD-10-CM

## 2016-03-22 NOTE — Patient Instructions (Addendum)
Adduction: Hip - Knees Together (Sitting)    Sit with towel roll between knees. Push knees together squeeze pelvic floor 50% contraction. Hold for __3-5_ seconds. Rest for 5 seconds. Repeat _10__ times. Do _3__ times a day.  Sit on a sheet and pull from back to front. Copyright  VHI. All rights reserved.   [facility] [address] [place], [place] [address] Phone # [phone] Fax [phone]

## 2016-03-22 NOTE — Therapy (Signed)
[facility] [address] [place], [place], [address] Phone: [phone]   Fax:  [phone]  Physical Therapy Treatment  Patient Details  Name: Ashley [name] MRN: [id number] Date of Birth: 02-Jul-1945 Referring Provider: Dr. Harl Bowie  Encounter Date: 03/22/2016      PT End of Session - 03/22/16 1413    Visit Number 4   Number of Visits 10   Date for PT Re-Evaluation 04/17/16   Authorization Type medicare g-code on 10th visit   PT Start Time 1405   PT Stop Time 1450   PT Time Calculation (min) 45 min   Activity Tolerance Patient tolerated treatment well   Behavior During Therapy [facility] for tasks assessed/performed      Past Medical History:  Diagnosis Date  . Abdominal aortic atherosclerosis ([place]) 11/2015   by CT  . Allergy   . Anemia   . Anxiety   . Automatic implantable cardioverter-defibrillator in situ 2012   a. MDT single lead ICD.  Marland Kitchen Bronchiectasis    a. possibly mild, treat URIs aggressively  . Cancer (HCC)    basal cell, arms & neck  . Chest pain    a. prev neg MV.  . Chronic sinusitis   . Chronic systolic CHF (congestive heart failure) (HCC)    a. EF 25-35%, LV dilation, significant MR, goal weight 150lbs;  b. 04/2014 Echo: EF 50-55%, Gr 1 DD.  Marland Kitchen Depression   . Diarrhea 07/14/2008   Qualifier: Diagnosis of  By: Glori Bickers MD, Carmell Austria   . Disorder of vocal cords   . Essential hypertension   . Fibrocystic breast disease   . Fibromyalgia   . GERD (gastroesophageal reflux disease)   . H/O multiple allergies 10/10/2013  . Hemorrhoids   . Hemorrhoids   . History of diverticulitis of colon   . History of pneumonia 2012  . Insomnia   . Irritable bowel syndrome   . Laryngeal nodule    Dr. Thomasena Edis  . Lung disease 07/14/11  . Migraine without aura, without mention of intractable migraine without mention of status migrainosus   . Mitral regurgitation   . Nonischemic cardiomyopathy ([place])    a.  Recovered LV fxn - prev as low as 25%, now 50-55% by echo 04/2014;  b. 2012 s/p MDT [id number] [place] VR single lead ICD.  Marland Kitchen Osteoarthritis    knees, back, hands, low back,   . Osteoporosis 01/2013   T-2.7 spine (01/2016)  . Pneumonia 2011  . PONV (postoperative nausea and vomiting)   . Pre-syncope   . Pure hypercholesterolemia   . sigmoid diverticulitis with perforation, abscess and fistula 09/30/2013  . Sleep apnea 2010   borderline, inconclusive- /w [place]     Past Surgical History:  Procedure Laterality Date  . ABDOMINAL HYSTERECTOMY  1987   w/BSO  . ANAL RECTAL MANOMETRY N/A 02/02/2016   Procedure: ANO RECTAL MANOMETRY;  Surgeon: Mauri Pole, MD;  Location: [facility];  Service: Endoscopy;  Laterality: N/A;  . APPENDECTOMY    . AUGMENTATION MAMMAPLASTY    . BREAST BIOPSY     "I've had 2-3 bx; all benign"  . breast cystectomies     due to FCBD  . BREAST MASS EXCISION  01/2010   Left-fibrocystic change w/intraductal papilloma, no malignancy  . carotid US  [id number]   [id number] LICA, normal RICA  . [place] Bunionectomy  11/04/08   Right Great Toe (Dr. Beola Cord)  . COLONOSCOPY  1998   nml (  sigmoid-Gilbert-nol)  . Colonoscopy  01/04/04   polyps, diverticulosis/severe  . COLOSTOMY N/A 10/02/2013   DESCENDING COLOSTOMY;  Surgeon: Imogene Burn. Tsuei, MD  . Derrill Memo REVERSAL N/A 02/10/2014   [name], MD  . CT chest  12/2010   possible bronchiectasis lower lobes, 02/2011 - enlarged bilateral effusions, bibasilar atx  . CT chest  04/2011   no PE.  abnormal R hilar and mediastinal adenopathy --> on rpt 05/2011, resolved  . DEXA  03/28/99   osteopenia L/S  . DEXA  03/12/03   2.2 Spine 0.1 Hip  Osteopenia  . DEXA  11/14/05   1.9 Spine 0.3 Hip  slight improvement  . DEXA  11/22/06   improved osteo and fem neck  . DEXA  01/2013   T score -2.6 at spine  . EMG/MCV  10/16/01   + mild carpal tunnel  . ESOPHAGOGASTRODUODENOSCOPY  09/2014   small HH, irregular Z line, fundic gland  polyps with mild gastritis ([place])  . Flex laryngoscopy  06/11/06   ([place]) nml  . Head MRI, Head CT  10/1998   ? H/A focus ([place])  . ICD placement  07/14/11   w/pacemaker  . IMPLANTABLE CARDIOVERTER DEFIBRILLATOR IMPLANT N/A 07/14/2011   IMPLANTABLE CARDIOVERTER DEFIBRILLATOR IMPLANT;  Surgeon: Evans Lance, MD;  Location: [facility] CATH LAB;  Service: Cardiovascular;  Laterality: N/A;  . KNEE ARTHROSCOPY W/ ORIF     Left; "meniscus tear"  . KNEE SURGERY     left  . L/S films  11/21/01   nml; Right hip nml  . LASIK     left eye  . MANDIBLE SURGERY     "got about 6 pins in the bottom of my jaw"  . MANDIBLE SURGERY    . MRI Right Hip  12/12/01   Tendonitis Gluteus Medius to Gtr Trochanter Neg Bursitis (Dr. Eulas Post)  . MRI U/S  11/21/01   min. DDD L/3-4  . NCS  11/2014   L ulnar neuropathy, mild B median nerve entrapment ([place])  . OSTEOTOMY     Left foot  . PARTIAL COLECTOMY N/A 10/02/2013   PARTIAL COLECTOMY;  Surgeon: Imogene Burn. Tsuei, MD  . sinus surgery     x3 with balloon   . stress myoview  02/2011   no significant ischemia  . TOE AMPUTATION     "toe beside baby toe on left foot; got gangrene from corn"  . TONSILLECTOMY AND ADENOIDECTOMY    . US ECHOCARDIOGRAPHY  04/2011   mildly dilated LV, EF 25-30%, hypokinesis, restrictive physiology, mod MR, no AS    There were no vitals filed for this visit.      Subjective Assessment - 03/22/16 1410    Subjective Pt states she had a big blow out and isn't sure if it had anything to do with the stretches.  States she had blow out Sunday but felt bad Friday and Saturday leading up to that with bad constipation since Thursday. States she doesn't feel like the scar tissue is getting softer and she is having difficulty with breath coordination   How long can you sit comfortably? Increases pain   How long can you stand comfortably? increases pain   How long can you walk comfortably? painful   Patient Stated Goals be able to bend for  housework and gardening; not have constipation that I have to remove bowel manually; no having a blow out with bowels   Currently in Pain? Yes   Pain Score 2    Pain  Location Abdomen   Pain Orientation Lower   Pain Descriptors / Indicators Sharp   Pain Type Chronic pain   Pain Onset More than a month ago   Pain Frequency Constant   Aggravating Factors  always there "my normal pain that never goes away"   Pain Relieving Factors nothing   Effect of Pain on Daily Activities no   Multiple Pain Sites No                      Pelvic Floor Special Questions - 03/22/16 0001    Biofeedback resting level average 1.5 uv; needed verbal cues to contract above 2.5 uv to work on endurance. siting contract with ball between knees and use a towel to sit on and pull upward to facilitate anal contraction   Biofeedback sensor type Rectal  surface   Biofeedback Activity Quick contraction  holding for 5 sec with substitution           [facility] Adult PT Treatment/Exercise - 03/22/16 0001      Neuro Re-ed    Neuro Re-ed Details  biofeedback for anal sphincter control, contract and relax with holding small contraction for up to 5 sec      Exercises   Other Exercises  diaphgramatic breathing in sitting using a theraband for tactile cues to expand the stomach when breathing inward                PT Education - 03/22/16 1449    Education provided Yes   Education Details hip adduction with pelvic floor contraction in sitting - edu with biofeedback   Person(s) Educated Patient   Methods Explanation;Demonstration;Verbal cues;Handout   Comprehension Verbalized understanding;Returned demonstration          PT Short Term Goals - 03/22/16 1511      PT SHORT TERM GOAL #1   Title independent with initial HEP   Time 4   Period Weeks   Status Achieved     PT SHORT TERM GOAL #2   Title understand how to perform abdominal massage to assist in perastalic motion of intestines   Time  4   Period Weeks   Status Achieved     PT SHORT TERM GOAL #3   Title understand correct toileting techinque to have a bowel movement to reduce constipation   Time 4   Period Weeks   Status Achieved     PT SHORT TERM GOAL #4   Title pain with daily activities decreased >/= 25%   Time 4   Period Weeks   Status Achieved           PT Long Term Goals - 02/21/16 1319      PT LONG TERM GOAL #1   Title independent with HEP and understand how to progress herselft   Time 8   Period Weeks   Status New     PT LONG TERM GOAL #2   Title fecal leakage decreased >/= 50% due to increased in pelvic floor strength and coordination   Time 8   Period Weeks   Status New     PT LONG TERM GOAL #3   Title pain with daily activities decreased >/= 50% due to improved mobility of tissue    Time 8   Period Weeks   Status New     PT LONG TERM GOAL #4   Title ability to bend forward with reduction in abdominal pain >/= 50% due to decreased swelling in abdomen and  reduction in trigger points   Time 8   Period Weeks   Status New     PT LONG TERM GOAL #5   Title foto score </= 43% limitation   Time 8   Period Weeks   Status New     Additional Long Term Goals   Additional Long Term Goals Yes     PT LONG TERM GOAL #6   Title sit to stand 5 times test </= 12 sec due to increased strength and reduction in pain to improve balance   Time 8   Period Weeks   Status New               Plan - 03/22/16 1417    Clinical Impression Statement Patient was educated on filling out the bowel and bladder diary 1-2 days. Pt was educated in contracting pelvic floor needing verbal and tactile cues including biofeedback for visual input on muscle contractions.  Pt improved ability to control during session and was able to sense when she was co-contracting with glutes.  Pt needed hip adduction ball squeeze and pulling up on sheet for additional input in order to get the targeted contraction.  Pt  continues to need skilled PT and training for greater endurance in order to be able to control bowel movements.  She was only able to sustain contraction for 4-5 sec and fatigued quickly during exercise today.   Rehab Potential Good   Clinical Impairments Affecting Rehab Potential Pacemaker; abdominal hernia with no repair; osteoporosis   PT Frequency 1x / week   PT Duration 8 weeks   PT Treatment/Interventions Biofeedback;Moist Heat;Therapeutic activities;Therapeutic exercise;Balance training;Neuromuscular re-education;Patient/family education;Passive range of motion;Scar mobilization;Manual techniques;Dry needling   PT Next Visit Plan ; abdominal bracing; diaphragmatic breathing; soft tissue work; work on pelvic floor contraction, biofeedback for pelvic floor endurance   PT Home Exercise Plan progress as needed   Consulted and Agree with Plan of Care Patient      Patient will benefit from skilled therapeutic intervention in order to improve the following deficits and impairments:  Decreased range of motion, Increased fascial restricitons, Decreased endurance, Increased muscle spasms, Pain, Decreased activity tolerance, Impaired flexibility, Decreased strength, Decreased mobility, Decreased balance, Increased edema  Visit Diagnosis: Muscle weakness (generalized)  Unspecified lack of coordination  Other muscle spasm     Problem List Patient Active Problem List   Diagnosis Date Noted  . Bladder wall thickening 02/16/2016  . Fecal incontinence   . Change in bowel habits 11/22/2015  . Left sided abdominal pain 11/22/2015  . History of diverticular abscess 11/22/2015  . Abdominal aortic atherosclerosis ([place]) 11/13/2015  . Acute sinusitis 10/13/2015  . Acute renal failure ([place]) 09/27/2015  . Abdominal discomfort 08/30/2015  . 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  . BRBPR (bright red blood per rectum) 08/30/2015  . Hoarseness 06/21/2015  . Diarrhea 06/21/2015  . Hypersensitivity to  pneumococcal vaccine 06/10/2015  . Medicare annual wellness visit, subsequent 06/08/2015  . Advanced care planning/counseling discussion 06/08/2015  . Intertrigo 04/06/2015  . Chronic daily headache 04/06/2015  . Neuropathy ([place]) 04/06/2015  . Paresthesia of bilateral legs 04/06/2015  . Pre-syncope 02/23/2015  . Essential hypertension   . Nonischemic cardiomyopathy ([place])   . Chest pain and shortness of breath 12/25/2014  . S/P colostomy takedown 02/10/2014  . ICD (implantable cardioverter-defibrillator) in place 10/23/2013  . H/O multiple allergies 10/10/2013  . Sigmoid diverticulitis with abscess, perforation and fistula formation 09/25/2013  . Osteoporosis 01/13/2013  . Irregular heart beat 08/06/2012  .  Depression 07/09/2012  . Chronic recurrent sinusitis 06/17/2012  . Vitamin D deficiency 03/05/2012  . CKD (chronic kidney disease) stage 3, GFR 30-59 ml/min 11/09/2011  . Epistaxis, recurrent 10/11/2011  . Orthostatic hypotension 04/30/2011  . Cardiomyopathy, dilated, nonischemic ([place]) 03/01/2011  . Mitral regurgitation 02/24/2011  . Chronic systolic CHF (congestive heart failure) ([place]) 02/24/2011  . Cough 01/20/2011  . Chronic fatigue and malaise 11/02/2010  . Anxiety state 08/18/2010  . 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  . HYPERCHOLESTEROLEMIA, PURE 11/22/2006  . COMMON MIGRAINE 11/22/2006  . DIVERTICULOSIS, COLON 11/22/2006  . Irritable bowel syndrome 11/22/2006  . FIBROCYSTIC BREAST DISEASE 11/22/2006  . Osteoarthritis 11/22/2006  . Fibromyalgia 11/22/2006    Earlie Counts, PT 03/22/16 3:14 PM   [facility] [address] [place], [place], [address] Phone: [phone]   Fax:  [phone]  Name: Ashley [name] MRN: [id number] Date of Birth: 1946/02/01

## 2016-03-29 ENCOUNTER — Encounter: Payer: Self-pay | Admitting: Physical Therapy

## 2016-03-29 ENCOUNTER — Ambulatory Visit: Payer: Medicare Other | Admitting: Physical Therapy

## 2016-03-29 DIAGNOSIS — M62838 Other muscle spasm: Secondary | ICD-10-CM

## 2016-03-29 DIAGNOSIS — R279 Unspecified lack of coordination: Secondary | ICD-10-CM | POA: Diagnosis not present

## 2016-03-29 DIAGNOSIS — M6281 Muscle weakness (generalized): Secondary | ICD-10-CM

## 2016-03-29 NOTE — Therapy (Signed)
[facility] [address], [place], [place], [address] Phone: [phone]   Fax:  [phone]  Physical Therapy Treatment  Patient Details  Name: Ashley [name] MRN: [id number] Date of Birth: 06/25/1945 Referring Provider: Dr. Harl Bowie  Encounter Date: 03/29/2016      PT End of Session - 03/29/16 1443    Visit Number 5   Number of Visits 10   Date for PT Re-Evaluation 04/17/16   Authorization Type medicare g-code on 10th visit   PT Start Time 1400   PT Stop Time 1444   PT Time Calculation (min) 44 min   Activity Tolerance Patient tolerated treatment well   Behavior During Therapy [facility] for tasks assessed/performed      Past Medical History:  Diagnosis Date  . Abdominal aortic atherosclerosis ([place]) 11/2015   by CT  . Allergy   . Anemia   . Anxiety   . Automatic implantable cardioverter-defibrillator in situ 2012   a. MDT single lead ICD.  Marland Kitchen Bronchiectasis    a. possibly mild, treat URIs aggressively  . Cancer (HCC)    basal cell, arms & neck  . Chest pain    a. prev neg MV.  . Chronic sinusitis   . Chronic systolic CHF (congestive heart failure) (HCC)    a. EF 25-35%, LV dilation, significant MR, goal weight 150lbs;  b. 04/2014 Echo: EF 50-55%, Gr 1 DD.  Marland Kitchen Depression   . Diarrhea 07/14/2008   Qualifier: Diagnosis of  By: Glori Bickers MD, Carmell Austria   . Disorder of vocal cords   . Essential hypertension   . Fibrocystic breast disease   . Fibromyalgia   . GERD (gastroesophageal reflux disease)   . H/O multiple allergies 10/10/2013  . Hemorrhoids   . Hemorrhoids   . History of diverticulitis of colon   . History of pneumonia 2012  . Insomnia   . Irritable bowel syndrome   . Laryngeal nodule    Dr. Thomasena Edis  . Lung disease 07/14/11  . Migraine without aura, without mention of intractable migraine without mention of status migrainosus   . Mitral regurgitation   . Nonischemic cardiomyopathy ([place])    a.  Recovered LV fxn - prev as low as 25%, now 50-55% by echo 04/2014;  b. 2012 s/p MDT [id number] [place] VR single lead ICD.  Marland Kitchen Osteoarthritis    knees, back, hands, low back,   . Osteoporosis 01/2013   T-2.7 spine (01/2016)  . Pneumonia 2011  . PONV (postoperative nausea and vomiting)   . Pre-syncope   . Pure hypercholesterolemia   . sigmoid diverticulitis with perforation, abscess and fistula 09/30/2013  . Sleep apnea 2010   borderline, inconclusive- /w [place]     Past Surgical History:  Procedure Laterality Date  . ABDOMINAL HYSTERECTOMY  1987   w/BSO  . ANAL RECTAL MANOMETRY N/A 02/02/2016   Procedure: ANO RECTAL MANOMETRY;  Surgeon: Mauri Pole, MD;  Location: [facility];  Service: Endoscopy;  Laterality: N/A;  . APPENDECTOMY    . AUGMENTATION MAMMAPLASTY    . BREAST BIOPSY     "I've had 2-3 bx; all benign"  . breast cystectomies     due to FCBD  . BREAST MASS EXCISION  01/2010   Left-fibrocystic change w/intraductal papilloma, no malignancy  . carotid US  06/4095   3-53% LICA, normal RICA  . [employer] Bunionectomy  11/04/08   Right Great Toe (Dr. Beola Cord)  . COLONOSCOPY  1998   nml (  sigmoid-Gilbert-nol)  . Colonoscopy  01/04/04   polyps, diverticulosis/severe  . COLOSTOMY N/A 10/02/2013   DESCENDING COLOSTOMY;  Surgeon: Imogene Burn. Tsuei, MD  . Derrill Memo REVERSAL N/A 02/10/2014   [name], MD  . CT chest  12/2010   possible bronchiectasis lower lobes, 02/2011 - enlarged bilateral effusions, bibasilar atx  . CT chest  04/2011   no PE.  abnormal R hilar and mediastinal adenopathy --> on rpt 05/2011, resolved  . DEXA  03/28/99   osteopenia L/S  . DEXA  03/12/03   2.2 Spine 0.1 Hip  Osteopenia  . DEXA  11/14/05   1.9 Spine 0.3 Hip  slight improvement  . DEXA  11/22/06   improved osteo and fem neck  . DEXA  01/2013   T score -2.6 at spine  . EMG/MCV  10/16/01   + mild carpal tunnel  . ESOPHAGOGASTRODUODENOSCOPY  09/2014   small HH, irregular Z line, fundic gland  polyps with mild gastritis ([place])  . Flex laryngoscopy  06/11/06   ([place]) nml  . Head MRI, Head CT  10/1998   ? H/A focus ([place])  . ICD placement  07/14/11   w/pacemaker  . IMPLANTABLE CARDIOVERTER DEFIBRILLATOR IMPLANT N/A 07/14/2011   IMPLANTABLE CARDIOVERTER DEFIBRILLATOR IMPLANT;  Surgeon: Evans Lance, MD;  Location: [facility] CATH LAB;  Service: Cardiovascular;  Laterality: N/A;  . KNEE ARTHROSCOPY W/ ORIF     Left; "meniscus tear"  . KNEE SURGERY     left  . L/S films  11/21/01   nml; Right hip nml  . LASIK     left eye  . MANDIBLE SURGERY     "got about 6 pins in the bottom of my jaw"  . MANDIBLE SURGERY    . MRI Right Hip  12/12/01   Tendonitis Gluteus Medius to Gtr Trochanter Neg Bursitis (Dr. Eulas Post)  . MRI U/S  11/21/01   min. DDD L/3-4  . NCS  11/2014   L ulnar neuropathy, mild B median nerve entrapment ([place])  . OSTEOTOMY     Left foot  . PARTIAL COLECTOMY N/A 10/02/2013   PARTIAL COLECTOMY;  Surgeon: Imogene Burn. Tsuei, MD  . sinus surgery     x3 with balloon   . stress myoview  02/2011   no significant ischemia  . TOE AMPUTATION     "toe beside baby toe on left foot; got gangrene from corn"  . TONSILLECTOMY AND ADENOIDECTOMY    . US ECHOCARDIOGRAPHY  04/2011   mildly dilated LV, EF 25-30%, hypokinesis, restrictive physiology, mod MR, no AS    There were no vitals filed for this visit.      Subjective Assessment - 03/29/16 1410    Subjective I pooped 3-4 times per day.  I had 2 blow outs.  Sometimes it was hard to get bowel movement out. I had 2 blowouts per this week. I have gotten to the toilet in time and not leak.  I have diahrrea.  I am unable to squat or bend over due to abdominal pain.    How long can you sit comfortably? Increases pain   How long can you stand comfortably? increases pain   How long can you walk comfortably? painful   Patient Stated Goals be able to bend for housework and gardening; not have constipation that I have to remove bowel  manually; no having a blow out with bowels   Currently in Pain? Yes   Pain Score 1    Pain Location Abdomen  Pain Orientation Lower   Pain Descriptors / Indicators Sharp   Pain Type Chronic pain   Pain Onset More than a month ago   Pain Frequency Constant   Aggravating Factors  bend over   Pain Relieving Factors nothing    Multiple Pain Sites No            [facility] PT Assessment - 03/29/16 0001      Standardized Balance Assessment   Five times sit to stand comments  18 sec, 15 sec  becomes SOB and fatigued                     [facility] Adult PT Treatment/Exercise - 03/29/16 0001      Manual Therapy   Manual Therapy Myofascial release;Soft tissue mobilization   Manual therapy comments scar massage with assistive device to lift up the skin and release the fascia   Soft tissue mobilization abdominal massage to promote peristatlic motion                PT Education - 03/29/16 1443    Education provided Yes   Education Details sit to stand  for balance   Person(s) Educated Patient   Methods Explanation;Demonstration;Handout   Comprehension Returned demonstration;Verbalized understanding          PT Short Term Goals - 03/22/16 1511      PT SHORT TERM GOAL #1   Title independent with initial HEP   Time 4   Period Weeks   Status Achieved     PT SHORT TERM GOAL #2   Title understand how to perform abdominal massage to assist in perastalic motion of intestines   Time 4   Period Weeks   Status Achieved     PT SHORT TERM GOAL #3   Title understand correct toileting techinque to have a bowel movement to reduce constipation   Time 4   Period Weeks   Status Achieved     PT SHORT TERM GOAL #4   Title pain with daily activities decreased >/= 25%   Time 4   Period Weeks   Status Achieved           PT Long Term Goals - 03/29/16 1424      PT LONG TERM GOAL #1   Title independent with HEP and understand how to progress herselft   Time 8    Period Weeks   Status On-going     PT LONG TERM GOAL #2   Title fecal leakage decreased >/= 50% due to increased in pelvic floor strength and coordination   Time 8   Period Weeks   Status On-going  hs been to the bathroom in time     PT LONG TERM GOAL #3   Title pain with daily activities decreased >/= 50% due to improved mobility of tissue    Time 8   Period Weeks   Status New     PT LONG TERM GOAL #4   Title ability to bend forward with reduction in abdominal pain >/= 50% due to decreased swelling in abdomen and reduction in trigger points   Time 8   Period Weeks   Status On-going     PT LONG TERM GOAL #5   Title foto score </= 43% limitation   Time 8   Period Weeks   Status On-going     PT LONG TERM GOAL #6   Title sit to stand 5 times test </= 12 sec due to increased  strength and reduction in pain to improve balance   Baseline 15 sec   Time 8   Period Weeks   Status On-going               Plan - 03/29/16 1444    Clinical Impression Statement Patient has increased mobility of abdominal scar.  Patient able to do sit to stand in 15 sec compared to 21 sec.  Patient is doing the exercises at home. Patient has not met goals this week but is progressing toward them.  Patien twill benefit from skilled therapy to improve pelvic floor strength and endurance.    Rehab Potential Good   Clinical Impairments Affecting Rehab Potential Pacemaker; abdominal hernia with no repair; osteoporosis   PT Frequency 1x / week   PT Duration 8 weeks   PT Treatment/Interventions Biofeedback;Moist Heat;Therapeutic activities;Therapeutic exercise;Balance training;Neuromuscular re-education;Patient/family education;Passive range of motion;Scar mobilization;Manual techniques;Dry needling   PT Next Visit Plan  abdominal bracing;  soft tissue work; work on pelvic floor contraction, increase time with pelvic floor contraction   PT Home Exercise Plan progress as needed   Consulted and Agree with  Plan of Care Patient      Patient will benefit from skilled therapeutic intervention in order to improve the following deficits and impairments:  Decreased range of motion, Increased fascial restricitons, Decreased endurance, Increased muscle spasms, Pain, Decreased activity tolerance, Impaired flexibility, Decreased strength, Decreased mobility, Decreased balance, Increased edema  Visit Diagnosis: Muscle weakness (generalized)  Unspecified lack of coordination  Other muscle spasm     Problem List Patient Active Problem List   Diagnosis Date Noted  . Bladder wall thickening 02/16/2016  . Fecal incontinence   . Change in bowel habits 11/22/2015  . Left sided abdominal pain 11/22/2015  . History of diverticular abscess 11/22/2015  . Abdominal aortic atherosclerosis ([place]) 11/13/2015  . Acute sinusitis 10/13/2015  . Acute renal failure ([place]) 09/27/2015  . Abdominal discomfort 08/30/2015  . 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  . BRBPR (bright red blood per rectum) 08/30/2015  . Hoarseness 06/21/2015  . Diarrhea 06/21/2015  . Hypersensitivity to pneumococcal vaccine 06/10/2015  . Medicare annual wellness visit, subsequent 06/08/2015  . Advanced care planning/counseling discussion 06/08/2015  . Intertrigo 04/06/2015  . Chronic daily headache 04/06/2015  . Neuropathy ([place]) 04/06/2015  . Paresthesia of bilateral legs 04/06/2015  . Pre-syncope 02/23/2015  . Essential hypertension   . Nonischemic cardiomyopathy ([place])   . Chest pain and shortness of breath 12/25/2014  . S/P colostomy takedown 02/10/2014  . ICD (implantable cardioverter-defibrillator) in place 10/23/2013  . H/O multiple allergies 10/10/2013  . Sigmoid diverticulitis with abscess, perforation and fistula formation 09/25/2013  . Osteoporosis 01/13/2013  . Irregular heart beat 08/06/2012  . Depression 07/09/2012  . Chronic recurrent sinusitis 06/17/2012  . Vitamin D deficiency 03/05/2012  . CKD (chronic kidney disease)  stage 3, GFR 30-59 ml/min 11/09/2011  . Epistaxis, recurrent 10/11/2011  . Orthostatic hypotension 04/30/2011  . Cardiomyopathy, dilated, nonischemic ([place]) 03/01/2011  . Mitral regurgitation 02/24/2011  . Chronic systolic CHF (congestive heart failure) ([place]) 02/24/2011  . Cough 01/20/2011  . Chronic fatigue and malaise 11/02/2010  . Anxiety state 08/18/2010  . 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  . HYPERCHOLESTEROLEMIA, PURE 11/22/2006  . COMMON MIGRAINE 11/22/2006  . DIVERTICULOSIS, COLON 11/22/2006  . Irritable bowel syndrome 11/22/2006  . FIBROCYSTIC BREAST DISEASE 11/22/2006  . Osteoarthritis 11/22/2006  . Fibromyalgia 11/22/2006    Earlie Counts, PT 03/29/16 2:48 PM   [facility] [address] [place], [place], [address] Phone: [phone]   Fax:  [phone]  Name: JOCI DRESS MRN: [id number] Date of Birth: Aug 13, 1945

## 2016-03-29 NOTE — Patient Instructions (Signed)
Functional Quadriceps: Sit to Stand    Sit on edge of chair, feet flat on floor. Stand upright, extending knees fully. Repeat __5__ times per set. Do __1__ sets per session. Do __1__ sessions per day.  [email or web address], PT @TODAY @ 2:27 PM   [facility] [address], [place], [place] [address] Phone # [phone] Fax [phone]  Copyright  VHI. All rights reserved.

## 2016-04-03 ENCOUNTER — Ambulatory Visit: Payer: Medicare Other | Admitting: Physical Therapy

## 2016-04-03 ENCOUNTER — Encounter: Payer: Self-pay | Admitting: Physical Therapy

## 2016-04-03 DIAGNOSIS — M62838 Other muscle spasm: Secondary | ICD-10-CM

## 2016-04-03 DIAGNOSIS — R279 Unspecified lack of coordination: Secondary | ICD-10-CM

## 2016-04-03 DIAGNOSIS — M6281 Muscle weakness (generalized): Secondary | ICD-10-CM

## 2016-04-03 NOTE — Patient Instructions (Signed)
Ashley [name], PT  Physical Therapy    Functional Quadriceps: Sit to Stand    Sit on edge of chair, feet flat on floor. Stand upright, extending knees fully. Repeat __5__ times per set. Do __1__ sets per session. Do __1__ sessions per day.  [email or web address].[email or web address]   Adduction: Hip - Knees Together (Sitting)    Sit with towel roll between knees. Push knees together squeeze pelvic floor 50% contraction. Hold for __3-5_ seconds. Rest for 5 seconds. Build to 10 seconds.  Repeat _10__ times. Do _3__ times a day.  Sit on a sheet and pull from back to front. Copyright  VHI. All rights reserved.       ABDUCTION: Standing (Active)    Stand, feet flat. Lift right leg out to side.  Complete _10__ sets of _1__ repetitions. Perform _1__ sessions per day.  http://gtsc.[email or web address].[email or web address]   Copyright  VHI. All rights reserved.  EXTENSION: Standing (Active)    Stand, both feet flat. Draw right leg behind body as far as possible.  Complete _1__ sets of _10__ repetitions. Perform _1__ sessions per day.  http://gtsc.exer.[email or web address]   Toileting Techniques for Bowel Movements (Defecation)  Using your belly (abdomen) and pelvic floor muscles to have a bowel movement is usually instinctive.  Sometimes people can have problems with these muscles and have to relearn proper defecation (emptying) techniques.  If you have weakness in your muscles, organs that are falling out, decreased sensation in your pelvis, or ignore your urge to go, you may find yourself straining to have a bowel movement.  You are straining if you are:   holding your breath or taking in a huge gulp of air and holding it   keeping your lips and jaw tensed and closed tightly  turning red in the face because of excessive pushing or forcing  developing or worsening your  hemorrhoids  getting faint while pushing  not emptying completely and have to defecate many times a day  If you are straining, you are actually making it harder  for yourself to have a bowel movement.  Many people find they are pulling up with the pelvic floor muscles and closing off instead of opening the anus. Due to lack pelvic floor relaxation and coordination the abdominal muscles, one has to work harder to push the feces out.  Many people have never been taught how to defecate efficiently and effectively.  Notice what happens to your body when you are having a bowel movement.  While you are sitting on the toilet pay attention to the following areas:  Jaw and mouth position  Angle of your hips    Whether your feet touch the ground or not  Arm placement   Spine position  Waist  Belly tension  Anus (opening of the anal canal)  An Evacuation/Defecation Plan   Here are the 4 basic points:  1. Lean forward enough for your elbows to rest on your knees 2. Support your feet on the floor or use a low stool if your feet don't touch the floor  3. Push out your belly as if you have swallowed a beach ball-you should feel a widening of your waist 4. Open and relax your pelvic floor muscles, rather than tightening around the anus      The following conditions my require modifications to your toileting posture:   If you have had surgery in the past that limits your back, hip, pelvic, knee or ankle flexibility  Constipation   Your healthcare practitioner may make  the following additional suggestions and adjustments:  1. Sit on the toilet  a)   Make sure your feet are supported. b)   Notice your hip angle and spine position-most people find it effective to lean forward or raise their knees, which can help the muscles around the anus to relax  c)   When you lean forward, place your forearms on your thighs for support  2. Relax suggestions a)   Breath deeply in through your nose and out slowly through your mouth as if you are smelling the flowers and blowing out the candles. b)   To become aware of how to relax your muscles,  contracting and releasing muscles can be helpful.  Pull your pelvic floor muscles in tightly by using the image of holding back gas, or closing around the anus (visualize making a circle smaller) and lifting the anus up and in.  Then release the muscles and your anus should drop down and feel open. Repeat 5 times ending with the feeling of relaxation. c)   Keep your pelvic floor muscles relaxed; let your belly bulge out. d)   The digestive tract starts at the mouth and ends at the anal opening, so be sure torelax both ends of the tube.  Place your tongue on the roof of your mouth withyour teeth separated.  This helps relax your mouth and will help to relax the anus at the same time.  3. Empty (defecation) a)   Keep your pelvic floor and sphincter relaxed, then bulge your anal muscles.  Make the anal opening wide.  b)   Stick your belly out as if you have swallowed a beach ball. c)   Make your belly wall hard using your belly muscles while continuing to breathe. Doing this makes it easier to open your anus. d)   Breath out and give a grunt (or try using other sounds such as ahhhh, shhhhh, ohhhh or grrrrrrr).  4)   Finish a)   As you finish your bowel movement, pull the pelvic floor muscles up and in.  This will leave your anus in the proper place rather than remaining pushed out and down. If you leave your anus pushed out and down, it will start to feel as though that is normal and give you incorrect signals about needing to have a bowel movement.

## 2016-04-03 NOTE — Therapy (Signed)
[facility] [address], [place], [place], [address] Phone: [phone]   Fax:  [phone]  Physical Therapy Treatment  Patient Details  Name: Ashley [name] MRN: [id number] Date of Birth: Oct 02, 1945 Referring Provider: Dr. Harl Bowie  Encounter Date: 04/03/2016      PT End of Session - 04/03/16 1319    Visit Number 6   Number of Visits 10   Date for PT Re-Evaluation 04/17/16   Authorization Type medicare g-code on 10th visit   PT Start Time 1230   PT Stop Time 1310   PT Time Calculation (min) 40 min   Activity Tolerance Patient tolerated treatment well   Behavior During Therapy [facility] for tasks assessed/performed      Past Medical History:  Diagnosis Date  . Abdominal aortic atherosclerosis ([place]) 11/2015   by CT  . Allergy   . Anemia   . Anxiety   . Automatic implantable cardioverter-defibrillator in situ 2012   a. MDT single lead ICD.  Marland Kitchen Bronchiectasis    a. possibly mild, treat URIs aggressively  . Cancer (HCC)    basal cell, arms & neck  . Chest pain    a. prev neg MV.  . Chronic sinusitis   . Chronic systolic CHF (congestive heart failure) (HCC)    a. EF 25-35%, LV dilation, significant MR, goal weight 150lbs;  b. 04/2014 Echo: EF 50-55%, Gr 1 DD.  Marland Kitchen Depression   . Diarrhea 07/14/2008   Qualifier: Diagnosis of  By: Glori Bickers MD, Carmell Austria   . Disorder of vocal cords   . Essential hypertension   . Fibrocystic breast disease   . Fibromyalgia   . GERD (gastroesophageal reflux disease)   . H/O multiple allergies 10/10/2013  . Hemorrhoids   . Hemorrhoids   . History of diverticulitis of colon   . History of pneumonia 2012  . Insomnia   . Irritable bowel syndrome   . Laryngeal nodule    Dr. Thomasena Edis  . Lung disease 07/14/11  . Migraine without aura, without mention of intractable migraine without mention of status migrainosus   . Mitral regurgitation   . Nonischemic cardiomyopathy ([place])    a.  Recovered LV fxn - prev as low as 25%, now 50-55% by echo 04/2014;  b. 2012 s/p MDT [id number] [place] VR single lead ICD.  Marland Kitchen Osteoarthritis    knees, back, hands, low back,   . Osteoporosis 01/2013   T-2.7 spine (01/2016)  . Pneumonia 2011  . PONV (postoperative nausea and vomiting)   . Pre-syncope   . Pure hypercholesterolemia   . sigmoid diverticulitis with perforation, abscess and fistula 09/30/2013  . Sleep apnea 2010   borderline, inconclusive- /w [place]     Past Surgical History:  Procedure Laterality Date  . ABDOMINAL HYSTERECTOMY  1987   w/BSO  . ANAL RECTAL MANOMETRY N/A 02/02/2016   Procedure: ANO RECTAL MANOMETRY;  Surgeon: Mauri Pole, MD;  Location: [facility];  Service: Endoscopy;  Laterality: N/A;  . APPENDECTOMY    . AUGMENTATION MAMMAPLASTY    . BREAST BIOPSY     "I've had 2-3 bx; all benign"  . breast cystectomies     due to FCBD  . BREAST MASS EXCISION  01/2010   Left-fibrocystic change w/intraductal papilloma, no malignancy  . carotid US  56/2563   8-93% LICA, normal RICA  . [place] Bunionectomy  11/04/08   Right Great Toe (Dr. Beola Cord)  . COLONOSCOPY  1998   nml (  sigmoid-Gilbert-nol)  . Colonoscopy  01/04/04   polyps, diverticulosis/severe  . COLOSTOMY N/A 10/02/2013   DESCENDING COLOSTOMY;  Surgeon: Imogene Burn. Tsuei, MD  . Derrill Memo REVERSAL N/A 02/10/2014   [name], MD  . CT chest  12/2010   possible bronchiectasis lower lobes, 02/2011 - enlarged bilateral effusions, bibasilar atx  . CT chest  04/2011   no PE.  abnormal R hilar and mediastinal adenopathy --> on rpt 05/2011, resolved  . DEXA  03/28/99   osteopenia L/S  . DEXA  03/12/03   2.2 Spine 0.1 Hip  Osteopenia  . DEXA  11/14/05   1.9 Spine 0.3 Hip  slight improvement  . DEXA  11/22/06   improved osteo and fem neck  . DEXA  01/2013   T score -2.6 at spine  . EMG/MCV  10/16/01   + mild carpal tunnel  . ESOPHAGOGASTRODUODENOSCOPY  09/2014   small HH, irregular Z line, fundic gland  polyps with mild gastritis ([place])  . Flex laryngoscopy  06/11/06   ([place]) nml  . Head MRI, Head CT  10/1998   ? H/A focus ([place])  . ICD placement  07/14/11   w/pacemaker  . IMPLANTABLE CARDIOVERTER DEFIBRILLATOR IMPLANT N/A 07/14/2011   IMPLANTABLE CARDIOVERTER DEFIBRILLATOR IMPLANT;  Surgeon: Evans Lance, MD;  Location: [facility] CATH LAB;  Service: Cardiovascular;  Laterality: N/A;  . KNEE ARTHROSCOPY W/ ORIF     Left; "meniscus tear"  . KNEE SURGERY     left  . L/S films  11/21/01   nml; Right hip nml  . LASIK     left eye  . MANDIBLE SURGERY     "got about 6 pins in the bottom of my jaw"  . MANDIBLE SURGERY    . MRI Right Hip  12/12/01   Tendonitis Gluteus Medius to Gtr Trochanter Neg Bursitis (Dr. Eulas Post)  . MRI U/S  11/21/01   min. DDD L/3-4  . NCS  11/2014   L ulnar neuropathy, mild B median nerve entrapment ([place])  . OSTEOTOMY     Left foot  . PARTIAL COLECTOMY N/A 10/02/2013   PARTIAL COLECTOMY;  Surgeon: Imogene Burn. Tsuei, MD  . sinus surgery     x3 with balloon   . stress myoview  02/2011   no significant ischemia  . TOE AMPUTATION     "toe beside baby toe on left foot; got gangrene from corn"  . TONSILLECTOMY AND ADENOIDECTOMY    . US ECHOCARDIOGRAPHY  04/2011   mildly dilated LV, EF 25-30%, hypokinesis, restrictive physiology, mod MR, no AS    There were no vitals filed for this visit.      Subjective Assessment - 04/03/16 1234    Subjective I had twinging pain for 2 days.  Diahrrea 3 days. No improvement with abdominal pain.    How long can you sit comfortably? Increases pain   How long can you stand comfortably? increases pain   How long can you walk comfortably? painful   Patient Stated Goals be able to bend for housework and gardening; not have constipation that I have to remove bowel manually; no having a blow out with bowels   Currently in Pain? Yes   Pain Score 2    Pain Location Abdomen   Pain Orientation Lower   Pain Descriptors / Indicators Sharp    Pain Type Chronic pain   Pain Onset More than a month ago   Pain Frequency Constant   Aggravating Factors  bend   Pain Relieving  Factors nothing   Multiple Pain Sites No            [facility] PT Assessment - 04/03/16 0001      Assessment   Medical Diagnosis R15.9 Fecal incontinence   Referring Provider Dr. Harl Bowie   Onset Date/Surgical Date 10/02/13   Prior Therapy None     Precautions   Precautions ICD/Pacemaker   Precaution Comments osteoporosis     Restrictions   Weight Bearing Restrictions No     Balance Screen   Has the patient fallen in the past 6 months No   Has the patient had a decrease in activity level because of a fear of falling?  No   Is the patient reluctant to leave their home because of a fear of falling?  No     Home Ecologist residence     Prior Function   Level of Independence Independent     Cognition   Overall Cognitive Status Within Functional Limits for tasks assessed     Observation/Other Assessments   Focus on Therapeutic Outcomes (FOTO)  45% limitation     Posture/Postural Control   Posture/Postural Control No significant limitations     AROM   Lumbar - Right Side Bend decreased by 25%     PROM   Right Hip External Rotation  40   Left Hip External Rotation  30   Left Hip ADduction 15     Strength   Right Hip Flexion 3+/5   Right Hip Extension 2-/5   Right Hip External Rotation  4/5   Right Hip ABduction 3+/5   Right Hip ADduction 2-/5   Left Hip Flexion 4-/5   Left Hip Extension 3/5   Left Hip External Rotation 4/5   Left Hip ABduction 3/5   Left Hip ADduction 3/5     Right Hip   Right Hip ADduction 15     Ambulation/Gait   Ambulation/Gait No     Standardized Balance Assessment   Five times sit to stand comments  16 sec                     [facility] Adult PT Treatment/Exercise - 04/03/16 0001      Self-Care   Self-Care Other Self-Care Comments   Other Self-Care  Comments  reviewed HEP; patient does not drink enough water; she is unable to add fiber due to doctors orders     Manual Therapy   Manual Therapy Myofascial release;Soft tissue mobilization   Soft tissue mobilization abdominal massage to promote peristatlic motion                PT Education - 04/03/16 1318    Education provided Yes   Education Details sit to stand; pelvic floor contraction; hip exercises; toileting technique   Person(s) Educated Patient   Methods Explanation;Demonstration;Verbal cues   Comprehension Verbalized understanding;Returned demonstration          PT Short Term Goals - 03/22/16 1511      PT SHORT TERM GOAL #1   Title independent with initial HEP   Time 4   Period Weeks   Status Achieved     PT SHORT TERM GOAL #2   Title understand how to perform abdominal massage to assist in perastalic motion of intestines   Time 4   Period Weeks   Status Achieved     PT SHORT TERM GOAL #3   Title understand correct toileting techinque to have a  bowel movement to reduce constipation   Time 4   Period Weeks   Status Achieved     PT SHORT TERM GOAL #4   Title pain with daily activities decreased >/= 25%   Time 4   Period Weeks   Status Achieved           PT Long Term Goals - May 03, 2016 1248      PT LONG TERM GOAL #1   Title independent with HEP and understand how to progress herselft   Time 8   Period Weeks   Status Not Met     PT LONG TERM GOAL #2   Title fecal leakage decreased >/= 50% due to increased in pelvic floor strength and coordination   Period Weeks   Status Not Met     PT LONG TERM GOAL #3   Title pain with daily activities decreased >/= 50% due to improved mobility of tissue    Time 8   Period Weeks   Status Not Met  No change in pain     PT LONG TERM GOAL #4   Title ability to bend forward with reduction in abdominal pain >/= 50% due to decreased swelling in abdomen and reduction in trigger points   Time 8   Period  Weeks   Status Not Met  No changes yet     PT LONG TERM GOAL #5   Title foto score </= 43% limitation   Time 8   Period Weeks   Status Not Met     PT LONG TERM GOAL #6   Title sit to stand 5 times test </= 12 sec due to increased strength and reduction in pain to improve balance   Baseline 15 sec   Time 8   Period Weeks   Status Not Met  16 sec               Plan - 05/03/2016 1321    Clinical Impression Statement Patient reports no reduction in pain.  Patient bowels go from diahrrea to nuggets.  Patient is not drinking water due to not liking it but also has to limit fluid intake due to her heart condition.  Patient has not had fecal leakage since therapy started.  Patient is not making significant progress therefore is being discharged.    Rehab Potential Good   Clinical Impairments Affecting Rehab Potential Pacemaker; abdominal hernia with no repair; osteoporosis   PT Treatment/Interventions Biofeedback;Moist Heat;Therapeutic activities;Therapeutic exercise;Balance training;Neuromuscular re-education;Patient/family education;Passive range of motion;Scar mobilization;Manual techniques;Dry needling   PT Next Visit Plan discharge to HEP   PT Home Exercise Plan Current HEP   Consulted and Agree with Plan of Care Patient      Patient will benefit from skilled therapeutic intervention in order to improve the following deficits and impairments:  Decreased range of motion, Increased fascial restricitons, Decreased endurance, Increased muscle spasms, Pain, Decreased activity tolerance, Impaired flexibility, Decreased strength, Decreased mobility, Decreased balance, Increased edema  Visit Diagnosis: Muscle weakness (generalized)  Unspecified lack of coordination  Other muscle spasm       G-Codes - 05-03-16 1324    Functional Assessment Tool Used FOTO score is 45% limitation   Functional Limitation Other PT primary   Other PT Primary Goal Status (Y1017) At least 40 percent  but less than 60 percent impaired, limited or restricted   Other PT Primary Discharge Status (P1025) At least 40 percent but less than 60 percent impaired, limited or restricted  Problem List Patient Active Problem List   Diagnosis Date Noted  . Bladder wall thickening 02/16/2016  . Fecal incontinence   . Change in bowel habits 11/22/2015  . Left sided abdominal pain 11/22/2015  . History of diverticular abscess 11/22/2015  . Abdominal aortic atherosclerosis ([place]) 11/13/2015  . Acute sinusitis 10/13/2015  . Acute renal failure ([place]) 09/27/2015  . Abdominal discomfort 08/30/2015  . 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  . BRBPR (bright red blood per rectum) 08/30/2015  . Hoarseness 06/21/2015  . Diarrhea 06/21/2015  . Hypersensitivity to pneumococcal vaccine 06/10/2015  . Medicare annual wellness visit, subsequent 06/08/2015  . Advanced care planning/counseling discussion 06/08/2015  . Intertrigo 04/06/2015  . Chronic daily headache 04/06/2015  . Neuropathy ([place]) 04/06/2015  . Paresthesia of bilateral legs 04/06/2015  . Pre-syncope 02/23/2015  . Essential hypertension   . Nonischemic cardiomyopathy ([place])   . Chest pain and shortness of breath 12/25/2014  . S/P colostomy takedown 02/10/2014  . ICD (implantable cardioverter-defibrillator) in place 10/23/2013  . H/O multiple allergies 10/10/2013  . Sigmoid diverticulitis with abscess, perforation and fistula formation 09/25/2013  . Osteoporosis 01/13/2013  . Irregular heart beat 08/06/2012  . Depression 07/09/2012  . Chronic recurrent sinusitis 06/17/2012  . Vitamin D deficiency 03/05/2012  . CKD (chronic kidney disease) stage 3, GFR 30-59 ml/min 11/09/2011  . Epistaxis, recurrent 10/11/2011  . Orthostatic hypotension 04/30/2011  . Cardiomyopathy, dilated, nonischemic ([place]) 03/01/2011  . Mitral regurgitation 02/24/2011  . Chronic systolic CHF (congestive heart failure) ([place]) 02/24/2011  . Cough 01/20/2011  . Chronic fatigue and  malaise 11/02/2010  . Anxiety state 08/18/2010  . 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  . HYPERCHOLESTEROLEMIA, PURE 11/22/2006  . COMMON MIGRAINE 11/22/2006  . DIVERTICULOSIS, COLON 11/22/2006  . Irritable bowel syndrome 11/22/2006  . FIBROCYSTIC BREAST DISEASE 11/22/2006  . Osteoarthritis 11/22/2006  . Fibromyalgia 11/22/2006    Earlie Counts, PT 04/03/16 1:29 PM   [facility] [address] [place], [place], [address] Phone: [phone]   Fax:  [phone]  Name: Ashley [name] MRN: [id number] Date of Birth: 08-05-1945  PHYSICAL THERAPY DISCHARGE SUMMARY  Visits from Start of Care: 6  Current functional level related to goals / functional outcomes: Goals not met due to no significant change in pain and strength.    Remaining deficits: See above   Education / Equipment: HEP Plan: Patient agrees to discharge.  Patient goals were not met. Patient is being discharged due to lack of progress. Thank you for the referral. Earlie Counts, PT 04/03/16 1:29 PM   ?????

## 2016-04-07 ENCOUNTER — Other Ambulatory Visit: Payer: Self-pay | Admitting: Family Medicine

## 2016-04-10 NOTE — Telephone Encounter (Signed)
Refill sent per [facility] refill protocol/SLS  

## 2016-04-11 ENCOUNTER — Other Ambulatory Visit: Payer: Self-pay | Admitting: Cardiovascular Disease

## 2016-04-11 DIAGNOSIS — H6123 Impacted cerumen, bilateral: Secondary | ICD-10-CM | POA: Diagnosis not present

## 2016-04-11 DIAGNOSIS — R05 Cough: Secondary | ICD-10-CM | POA: Diagnosis not present

## 2016-04-12 ENCOUNTER — Encounter: Payer: Medicare Other | Admitting: Physical Therapy

## 2016-04-19 ENCOUNTER — Encounter: Payer: Medicare Other | Admitting: Physical Therapy

## 2016-04-20 DIAGNOSIS — M25562 Pain in left knee: Secondary | ICD-10-CM | POA: Diagnosis not present

## 2016-04-20 DIAGNOSIS — M25561 Pain in right knee: Secondary | ICD-10-CM | POA: Diagnosis not present

## 2016-04-26 ENCOUNTER — Encounter: Payer: Medicare Other | Admitting: Physical Therapy

## 2016-05-17 ENCOUNTER — Ambulatory Visit (INDEPENDENT_AMBULATORY_CARE_PROVIDER_SITE_OTHER): Payer: Medicare Other | Admitting: *Deleted

## 2016-05-17 DIAGNOSIS — I420 Dilated cardiomyopathy: Secondary | ICD-10-CM

## 2016-05-17 DIAGNOSIS — I429 Cardiomyopathy, unspecified: Secondary | ICD-10-CM

## 2016-05-18 ENCOUNTER — Encounter: Payer: Self-pay | Admitting: Family Medicine

## 2016-05-18 ENCOUNTER — Ambulatory Visit (INDEPENDENT_AMBULATORY_CARE_PROVIDER_SITE_OTHER): Payer: Medicare Other | Admitting: Family Medicine

## 2016-05-18 VITALS — BP 124/70 | HR 80 | Temp 97.5°F | Wt 179.8 lb

## 2016-05-18 DIAGNOSIS — R252 Cramp and spasm: Secondary | ICD-10-CM

## 2016-05-18 DIAGNOSIS — J069 Acute upper respiratory infection, unspecified: Secondary | ICD-10-CM | POA: Diagnosis not present

## 2016-05-18 DIAGNOSIS — H608X3 Other otitis externa, bilateral: Secondary | ICD-10-CM | POA: Diagnosis not present

## 2016-05-18 LAB — BASIC METABOLIC PANEL
BUN: 19 mg/dL (ref 6–23)
CO2: 31 mEq/L (ref 19–32)
Calcium: 9.8 mg/dL (ref 8.4–10.5)
Chloride: 104 mEq/L (ref 96–112)
Creatinine, Ser: 1.12 mg/dL (ref 0.40–1.20)
GFR: 50.99 mL/min — ABNORMAL LOW (ref >60.00)
Glucose, Bld: 77 mg/dL (ref 70–99)
Potassium: 4.4 mEq/L (ref 3.5–5.1)
Sodium: 141 mEq/L (ref 135–145)

## 2016-05-18 MED ORDER — DOXYCYCLINE HYCLATE 100 MG PO CAPS: 100.0000 mg | ORAL_CAPSULE | Freq: Two times a day (BID) | ORAL | 0 refills | Status: DC

## 2016-05-18 MED ORDER — HYDROCOD POLST-CPM POLST ER 10-8 MG/5ML PO SUER: 5.0000 mL | Freq: Two times a day (BID) | ORAL | 0 refills | Status: DC | PRN

## 2016-05-18 NOTE — Progress Notes (Signed)
Subjective:    Patient ID: Ashley [name], female    DOB: Jul 03, 1945, 71 y.o.   MRN: [id number]  HPI This is a 71 yo female who presents today with cough and nasal congestion x 3 days with hoarseness. Has had bloody nasal drainage. Uses Nasonex bid x several days. She has "green chunky" sputum with cough, using vaporizer at night. No inhaler. No fever, having chills, no wheeze, chest and middle of back are sore, pain left side of abdomen. Was on Levaquin a couple of months ago and had pain in abdomen and diarrhea (was only able to complete 5 days). She is very insistent that she "doesn't do colds, viruses or the flu," that her symptoms are bacterial and require an antibiotic.  Has had problems with her bowels, weeks of diarrhea, recent constipation. Moving bowels every day or two, stools small and hard. Is no longer taking laxatives or fiber. Bowel issues have been chronic and she is followed by Dr. Silverio Decamp at [place]. She had a course of gut PT but did not see any improvement.   Has chronic eczema in bilateral ear canals. Feels pain behind ears, has been using a steroid cream and an unknown drop. Continues to have itching and discomfort behind ears.   Has had hypokalemia in the past and is currently having leg cramps, would like to have her electrolytes checked today. Last CMET 01/29/16.   Past Medical History:  Diagnosis Date  . Abdominal aortic atherosclerosis ([place]) 11/2015   by CT  . Allergy   . Anemia   . Anxiety   . Automatic implantable cardioverter-defibrillator in situ 2012   a. MDT single lead ICD.  Marland Kitchen Bronchiectasis    a. possibly mild, treat URIs aggressively  . Cancer (HCC)    basal cell, arms & neck  . Chest pain    a. prev neg MV.  . Chronic sinusitis   . Chronic systolic CHF (congestive heart failure) (HCC)    a. EF 25-35%, LV dilation, significant MR, goal weight 150lbs;  b. 04/2014 Echo: EF 50-55%, Gr 1 DD.  Marland Kitchen Depression   . Diarrhea 07/14/2008   Qualifier:  Diagnosis of  By: Glori Bickers MD, Carmell Austria   . Disorder of vocal cords   . Essential hypertension   . Fibrocystic breast disease   . Fibromyalgia   . GERD (gastroesophageal reflux disease)   . H/O multiple allergies 10/10/2013  . Hemorrhoids   . Hemorrhoids   . History of diverticulitis of colon   . History of pneumonia 2012  . Insomnia   . Irritable bowel syndrome   . Laryngeal nodule    Dr. Thomasena Edis  . Lung disease 07/14/11  . Migraine without aura, without mention of intractable migraine without mention of status migrainosus   . Mitral regurgitation   . Nonischemic cardiomyopathy ([place])    a. Recovered LV fxn - prev as low as 25%, now 50-55% by echo 04/2014;  b. 2012 s/p MDT [id number] [place] VR single lead ICD.  Marland Kitchen Osteoarthritis    knees, back, hands, low back,   . Osteoporosis 01/2013   T-2.7 spine (01/2016)  . Pneumonia 2011  . PONV (postoperative nausea and vomiting)   . Pre-syncope   . Pure hypercholesterolemia   . sigmoid diverticulitis with perforation, abscess and fistula 09/30/2013  . Sleep apnea 2010   borderline, inconclusive- /w [place]    Past Surgical History:  Procedure Laterality Date  . ABDOMINAL HYSTERECTOMY  1987   w/BSO  .  ANAL RECTAL MANOMETRY N/A 02/02/2016   Procedure: ANO RECTAL MANOMETRY;  Surgeon: Mauri Pole, MD;  Location: [facility];  Service: Endoscopy;  Laterality: N/A;  . APPENDECTOMY    . AUGMENTATION MAMMAPLASTY    . BREAST BIOPSY     "I've had 2-3 bx; all benign"  . breast cystectomies     due to FCBD  . BREAST MASS EXCISION  01/2010   Left-fibrocystic change w/intraductal papilloma, no malignancy  . carotid US  [id number]   123456 LICA, normal RICA  . [employer] Bunionectomy  11/04/08   Right Great Toe (Dr. Beola Cord)  . COLONOSCOPY  1998   nml (sigmoid-Gilbert-nol)  . Colonoscopy  01/04/04   polyps, diverticulosis/severe  . COLOSTOMY N/A 10/02/2013   DESCENDING COLOSTOMY;  Surgeon: Imogene Burn. Tsuei, MD  . Derrill Memo REVERSAL N/A 02/10/2014     [name], MD  . CT chest  12/2010   possible bronchiectasis lower lobes, 02/2011 - enlarged bilateral effusions, bibasilar atx  . CT chest  04/2011   no PE.  abnormal R hilar and mediastinal adenopathy --> on rpt 05/2011, resolved  . DEXA  03/28/99   osteopenia L/S  . DEXA  03/12/03   2.2 Spine 0.1 Hip  Osteopenia  . DEXA  11/14/05   1.9 Spine 0.3 Hip  slight improvement  . DEXA  11/22/06   improved osteo and fem neck  . DEXA  01/2013   T score -2.6 at spine  . EMG/MCV  10/16/01   + mild carpal tunnel  . ESOPHAGOGASTRODUODENOSCOPY  09/2014   small HH, irregular Z line, fundic gland polyps with mild gastritis ([place])  . Flex laryngoscopy  06/11/06   (Juengel) nml  . Head MRI, Head CT  10/1998   ? H/A focus ([place])  . ICD placement  07/14/11   w/pacemaker  . IMPLANTABLE CARDIOVERTER DEFIBRILLATOR IMPLANT N/A 07/14/2011   IMPLANTABLE CARDIOVERTER DEFIBRILLATOR IMPLANT;  Surgeon: Evans Lance, MD;  Location[facility] C[facility] LAB;  Service: Cardiovascular;  Laterality: N/A;  . KNEE ARTHROSCOPY W/ ORIF     Left; "meniscus tear"  . KNEE SURGERY     left  . L/S films  11/21/01   nml; Right hip nml  . LASIK     left eye  . MANDIBLE SURGERY     "got about 6 pins in the bottom of my jaw"  . MANDIBLE SURGERY    . MRI Right Hip  12/12/01   Tendonitis Gluteus Medius to Gtr Trochanter Neg Bursitis (Dr. Eulas Post)  . MRI U/S  11/21/01   min. DDD L/3-4  . NCS  11/2014   L ulnar neuropathy, mild B median nerve entrapment ([place])  . OSTEOTOMY     Left foot  . PARTIAL COLECTOMY N/A 10/02/2013   PARTIAL COLECTOMY;  Surgeon: Imogene Burn. Tsuei, MD  . sinus surgery     x3 with balloon   . stress myoview  02/2011   no significant ischemia  . TOE AMPUTATION     "toe beside baby toe on left foot; got gangrene from corn"  . TONSILLECTOMY AND ADENOIDECTOMY    . US ECHOCARDIOGRAPHY  04/2011   mildly dilated LV, EF 25-30%, hypokinesis, restrictive physiology, mod MR, no AS   Past Surgical History:  Procedure  Laterality Date  . ABDOMINAL HYSTERECTOMY  1987   w/BSO  . ANAL RECTAL MANOMETRY N/A 02/02/2016   Procedure: ANO RECTAL MANOMETRY;  Surgeon: Mauri Pole, MD;  Location: [facility];  Service: Endoscopy;  Laterality: N/A;  .  APPENDECTOMY    . AUGMENTATION MAMMAPLASTY    . BREAST BIOPSY     "I've had 2-3 bx; all benign"  . breast cystectomies     due to FCBD  . BREAST MASS EXCISION  01/2010   Left-fibrocystic change w/intraductal papilloma, no malignancy  . carotid US  [id number]   123456 LICA, normal RICA  . [employer] Bunionectomy  11/04/08   Right Great Toe (Dr. Beola Cord)  . COLONOSCOPY  1998   nml (sigmoid-Gilbert-nol)  . Colonoscopy  01/04/04   polyps, diverticulosis/severe  . COLOSTOMY N/A 10/02/2013   DESCENDING COLOSTOMY;  Surgeon: Imogene Burn. Tsuei, MD  . Derrill Memo REVERSAL N/A 02/10/2014   [name], MD  . CT chest  12/2010   possible bronchiectasis lower lobes, 02/2011 - enlarged bilateral effusions, bibasilar atx  . CT chest  04/2011   no PE.  abnormal R hilar and mediastinal adenopathy --> on rpt 05/2011, resolved  . DEXA  03/28/99   osteopenia L/S  . DEXA  03/12/03   2.2 Spine 0.1 Hip  Osteopenia  . DEXA  11/14/05   1.9 Spine 0.3 Hip  slight improvement  . DEXA  11/22/06   improved osteo and fem neck  . DEXA  01/2013   T score -2.6 at spine  . EMG/MCV  10/16/01   + mild carpal tunnel  . ESOPHAGOGASTRODUODENOSCOPY  09/2014   small HH, irregular Z line, fundic gland polyps with mild gastritis ([place])  . Flex laryngoscopy  06/11/06   (Juengel) nml  . Head MRI, Head CT  10/1998   ? H/A focus ([place])  . ICD placement  07/14/11   w/pacemaker  . IMPLANTABLE CARDIOVERTER DEFIBRILLATOR IMPLANT N/A 07/14/2011   IMPLANTABLE CARDIOVERTER DEFIBRILLATOR IMPLANT;  Surgeon: Evans Lance, MD;  Location: [facility] CATH LAB;  Service: Cardiovascular;  Laterality: N/A;  . KNEE ARTHROSCOPY W/ ORIF     Left; "meniscus tear"  . KNEE SURGERY     left  . L/S films  11/21/01   nml; Right hip nml   . LASIK     left eye  . MANDIBLE SURGERY     "got about 6 pins in the bottom of my jaw"  . MANDIBLE SURGERY    . MRI Right Hip  12/12/01   Tendonitis Gluteus Medius to Gtr Trochanter Neg Bursitis (Dr. Eulas Post)  . MRI U/S  11/21/01   min. DDD L/3-4  . NCS  11/2014   L ulnar neuropathy, mild B median nerve entrapment ([place])  . OSTEOTOMY     Left foot  . PARTIAL COLECTOMY N/A 10/02/2013   PARTIAL COLECTOMY;  Surgeon: Imogene Burn. Tsuei, MD  . sinus surgery     x3 with balloon   . stress myoview  02/2011   no significant ischemia  . TOE AMPUTATION     "toe beside baby toe on left foot; got gangrene from corn"  . TONSILLECTOMY AND ADENOIDECTOMY    . US ECHOCARDIOGRAPHY  04/2011   mildly dilated LV, EF 25-30%, hypokinesis, restrictive physiology, mod MR, no AS   Family History  Problem Relation Age of Onset  . Lung cancer Father     + smoker  . Colon polyps Father   . Dementia Mother   . Osteoporosis Mother     Lumbar spine  . Stroke Mother     x 5 @ 20 YOA  . Arthritis Mother     hands  . Emphysema Mother   . Alcohol abuse Paternal Grandfather   . Stroke  Paternal Grandfather     ?  Marland Kitchen Heart disease Paternal Grandfather   . Hypertension Maternal Grandfather     ?  Marland Kitchen Heart disease Paternal Grandmother   . Colon cancer Neg Hx   . Esophageal cancer Neg Hx   . Gallbladder disease Neg Hx    Social History  Substance Use Topics  . Smoking status: Never Smoker  . Smokeless tobacco: Never Used     Comment: Lived with smokers x 23 yrs, smoked herself "for a week"  . Alcohol use No      Review of Systems Per HPI    Objective:   Physical Exam  Constitutional: She is oriented to person, place, and time. She appears well-developed and well-nourished. No distress.  HENT:  Head: Normocephalic and atraumatic.  Right Ear: Tympanic membrane, external ear and ear canal normal.  Left Ear: Tympanic membrane, external ear and ear canal normal.  Nose: Nose normal.  Mouth/Throat:  Oropharynx is clear and moist.  Minimal flaking of bilateral ear canals, no swelling.   Eyes: Conjunctivae are normal.  Cardiovascular: Normal rate, regular rhythm and normal heart sounds.   Pulmonary/Chest: Effort normal and breath sounds normal.  Neurological: She is alert and oriented to person, place, and time.  Skin: Skin is warm and dry. She is not diaphoretic.  Psychiatric: She has a normal mood and affect. Her behavior is normal. Judgment and thought content normal.  Vitals reviewed.     BP 124/70 (BP Location: Left Arm, Patient Position: Sitting, Cuff Size: Normal)   Pulse 80   Temp 97.5 F (36.4 C) (Oral)   Wt 179 lb 12 oz (81.5 kg)   SpO2 95%   BMI 29.46 kg/m  Wt Readings from Last 3 Encounters:  05/18/16 179 lb 12 oz (81.5 kg)  02/16/16 172 lb 4 oz (78.1 kg)  02/08/16 173 lb 8 oz (78.7 kg)       Assessment & Plan:  1. Acute upper respiratory infection - patient with multiple medical problems including CHF, is insistent with regards to antibiotic treatment - chlorpheniramine-HYDROcodone (TUSSIONEX PENNKINETIC ER) 10-8 MG/5ML SUER; Take 5 mLs by mouth every 12 (twelve) hours as needed for cough.  Dispense: 70 mL; Refill: 0 - doxycycline (VIBRAMYCIN) 100 MG capsule; Take 1 capsule (100 mg total) by mouth 2 (two) times daily.  Dispense: 14 capsule; Refill: 0 - RTC precautions reviewed  2. Leg cramps - Basic metabolic panel  3. Chronic eczematous otitis externa of both ears - appearance very mild today, difficult to suggest treatment with current medication unknown, advised her to try small amount emollient cream several times a day in addition to current treatment, May need to see derm. Clarene Reamer, FNP-BC  [facility]  05/18/2016 11:10 AM

## 2016-05-18 NOTE — Progress Notes (Signed)
Pre visit review using our clinic review tool, if applicable. No additional management support is needed unless otherwise documented below in the visit note. 

## 2016-05-18 NOTE — Progress Notes (Signed)
Remote ICD transmission.   

## 2016-05-18 NOTE — Patient Instructions (Addendum)
Take antibiotic with food and full glass of water Hydrate well Can also use nasal saline to keep nose mositurized

## 2016-05-19 ENCOUNTER — Encounter: Payer: Self-pay | Admitting: Cardiology

## 2016-05-23 ENCOUNTER — Ambulatory Visit (INDEPENDENT_AMBULATORY_CARE_PROVIDER_SITE_OTHER): Payer: Medicare Other | Admitting: Family Medicine

## 2016-05-23 ENCOUNTER — Other Ambulatory Visit: Payer: Self-pay | Admitting: Family Medicine

## 2016-05-23 ENCOUNTER — Ambulatory Visit (INDEPENDENT_AMBULATORY_CARE_PROVIDER_SITE_OTHER)
Admission: RE | Admit: 2016-05-23 | Discharge: 2016-05-23 | Disposition: A | Discharge status: Home / Self Care | Payer: Medicare Other | Source: Ambulatory Visit | Attending: Family Medicine | Admitting: Family Medicine

## 2016-05-23 ENCOUNTER — Ambulatory Visit: Payer: Medicare Other

## 2016-05-23 ENCOUNTER — Encounter: Payer: Self-pay | Admitting: Family Medicine

## 2016-05-23 VITALS — BP 124/78 | HR 76 | Temp 97.6°F | Ht 65.0 in | Wt 177.5 lb

## 2016-05-23 VITALS — BP 120/74 | HR 76 | Temp 97.6°F | Ht 65.0 in | Wt 177.5 lb

## 2016-05-23 DIAGNOSIS — E7800 Pure hypercholesterolemia, unspecified: Secondary | ICD-10-CM | POA: Diagnosis not present

## 2016-05-23 DIAGNOSIS — K582 Mixed irritable bowel syndrome: Secondary | ICD-10-CM | POA: Diagnosis not present

## 2016-05-23 DIAGNOSIS — R197 Diarrhea, unspecified: Secondary | ICD-10-CM

## 2016-05-23 DIAGNOSIS — R109 Unspecified abdominal pain: Secondary | ICD-10-CM | POA: Diagnosis not present

## 2016-05-23 DIAGNOSIS — E559 Vitamin D deficiency, unspecified: Secondary | ICD-10-CM

## 2016-05-23 DIAGNOSIS — R194 Change in bowel habit: Secondary | ICD-10-CM

## 2016-05-23 DIAGNOSIS — Z1159 Encounter for screening for other viral diseases: Secondary | ICD-10-CM | POA: Diagnosis not present

## 2016-05-23 DIAGNOSIS — Z0000 Encounter for general adult medical examination without abnormal findings: Secondary | ICD-10-CM

## 2016-05-23 DIAGNOSIS — Z8719 Personal history of other diseases of the digestive system: Secondary | ICD-10-CM

## 2016-05-23 DIAGNOSIS — K5900 Constipation, unspecified: Secondary | ICD-10-CM | POA: Diagnosis not present

## 2016-05-23 LAB — CBC WITH DIFFERENTIAL/PLATELET
Basophils Absolute: 0 10*3/uL (ref 0.0–0.1)
Basophils Relative: 0.4 % (ref 0.0–3.0)
Eosinophils Absolute: 0.1 10*3/uL (ref 0.0–0.7)
Eosinophils Relative: 0.9 % (ref 0.0–5.0)
HCT: 38.9 % (ref 36.0–46.0)
Hemoglobin: 13.1 g/dL (ref 12.0–15.0)
Lymphocytes Relative: 25.5 % (ref 12.0–46.0)
Lymphs Abs: 2.9 10*3/uL (ref 0.7–4.0)
MCHC: 33.7 g/dL (ref 30.0–36.0)
MCV: 88.8 fl (ref 78.0–100.0)
Monocytes Absolute: 0.8 10*3/uL (ref 0.1–1.0)
Monocytes Relative: 7.2 % (ref 3.0–12.0)
Neutro Abs: 7.5 10*3/uL (ref 1.4–7.7)
Neutrophils Relative %: 66 % (ref 43.0–77.0)
Platelets: 267 10*3/uL (ref 150.0–400.0)
RBC: 4.38 Mil/uL (ref 3.87–5.11)
RDW: 13.1 % (ref 11.5–15.5)
WBC: 11.4 10*3/uL — ABNORMAL HIGH (ref 4.0–10.5)

## 2016-05-23 LAB — COMPREHENSIVE METABOLIC PANEL
ALT: 30 U/L (ref 0–35)
AST: 26 U/L (ref 0–37)
Albumin: 4.4 g/dL (ref 3.5–5.2)
Alkaline Phosphatase: 110 U/L (ref 39–117)
BUN: 21 mg/dL (ref 6–23)
CO2: 27 mEq/L (ref 19–32)
Calcium: 10.7 mg/dL — ABNORMAL HIGH (ref 8.4–10.5)
Chloride: 99 mEq/L (ref 96–112)
Creatinine, Ser: 1.06 mg/dL (ref 0.40–1.20)
GFR: 54.34 mL/min — ABNORMAL LOW (ref >60.00)
Glucose, Bld: 84 mg/dL (ref 70–99)
Potassium: 4.8 mEq/L (ref 3.5–5.1)
Sodium: 137 mEq/L (ref 135–145)
Total Bilirubin: 0.3 mg/dL (ref 0.2–1.2)
Total Protein: 8.2 g/dL (ref 6.0–8.3)

## 2016-05-23 LAB — LIPID PANEL
Cholesterol: 264 mg/dL — ABNORMAL HIGH (ref 0–200)
HDL: 46.6 mg/dL (ref >39.00)
NonHDL: 217.27
Total CHOL/HDL Ratio: 6
Triglycerides: 267 mg/dL — ABNORMAL HIGH (ref 0.0–149.0)
VLDL: 53.4 mg/dL — ABNORMAL HIGH (ref 0.0–40.0)

## 2016-05-23 LAB — TSH: TSH: 1.24 u[IU]/mL (ref 0.35–4.50)

## 2016-05-23 LAB — LDL CHOLESTEROL, DIRECT: Direct LDL: 163 mg/dL

## 2016-05-23 LAB — VITAMIN D 25 HYDROXY (VIT D DEFICIENCY, FRACTURES): VITD: 27.82 ng/mL — ABNORMAL LOW (ref 30.00–100.00)

## 2016-05-23 NOTE — Assessment & Plan Note (Signed)
Chronic. 

## 2016-05-23 NOTE — Patient Instructions (Addendum)
Xray today. Labs today.  I recommend you start docusate (colace) 100mg  once daily every day.  Continue miralax 1/2 capful every other day as needed for constipation, hold if diarrhea or stools loosen.  Decrease omeprazole to 40mg  once daily.  Keep follow up appointment.
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
## 2016-05-23 NOTE — Assessment & Plan Note (Signed)
IBS-mixed related.

## 2016-05-23 NOTE — Progress Notes (Signed)
Subjective:   Ashley [name] is a 71 y.o. female who presents for Medicare Annual (Subsequent) preventive examination.  Review of Systems:  N/A Cardiac Risk Factors include: advanced age (>73men, >68 women);hypertension     Objective:     Vitals: BP 120/74 (BP Location: Right Arm, Patient Position: Sitting, Cuff Size: Normal)   Pulse 76   Temp 97.6 F (36.4 C) (Oral)   Ht 5\' 5"  (1.651 m) Comment: no shoes  Wt 177 lb 8 oz (80.5 kg)   SpO2 98%   BMI 29.54 kg/m   Body mass index is 29.54 kg/m.   Tobacco History  Smoking Status  . Never Smoker  Smokeless Tobacco  . Never Used    Comment: Lived with smokers x 23 yrs, smoked herself "for a week"     Counseling given: No   Past Medical History:  Diagnosis Date  . Abdominal aortic atherosclerosis ([place]) 11/2015   by CT  . Allergy   . Anemia   . Anxiety   . Automatic implantable cardioverter-defibrillator in situ 2012   a. MDT single lead ICD.  Marland Kitchen Bronchiectasis    a. possibly mild, treat URIs aggressively  . Cancer (HCC)    basal cell, arms & neck  . Chest pain    a. prev neg MV.  . Chronic sinusitis   . Chronic systolic CHF (congestive heart failure) (HCC)    a. EF 25-35%, LV dilation, significant MR, goal weight 150lbs;  b. 04/2014 Echo: EF 50-55%, Gr 1 DD.  Marland Kitchen Depression   . Diarrhea 07/14/2008   Qualifier: Diagnosis of  By: Glori Bickers MD, Carmell Austria   . Disorder of vocal cords   . Essential hypertension   . Fibrocystic breast disease   . Fibromyalgia   . GERD (gastroesophageal reflux disease)   . H/O multiple allergies 10/10/2013  . Hemorrhoids   . Hemorrhoids   . History of diverticulitis of colon   . History of pneumonia 2012  . Insomnia   . Irritable bowel syndrome   . Laryngeal nodule    Dr. Thomasena Edis  . Lung disease 07/14/11  . Migraine without aura, without mention of intractable migraine without mention of status migrainosus   . Mitral regurgitation   . Nonischemic cardiomyopathy ([place])    a. Recovered  LV fxn - prev as low as 25%, now 50-55% by echo 04/2014;  b. 2012 s/p MDT [id number] [place] VR single lead ICD.  Marland Kitchen Osteoarthritis    knees, back, hands, low back,   . Osteoporosis 01/2013   T-2.7 spine (01/2016)  . Pneumonia 2011  . PONV (postoperative nausea and vomiting)   . Pre-syncope   . Pure hypercholesterolemia   . sigmoid diverticulitis with perforation, abscess and fistula 09/30/2013  . Sleep apnea 2010   borderline, inconclusive- /w [place]    Past Surgical History:  Procedure Laterality Date  . ABDOMINAL HYSTERECTOMY  1987   w/BSO  . ANAL RECTAL MANOMETRY N/A 02/02/2016   Procedure: ANO RECTAL MANOMETRY;  Surgeon: Mauri Pole, MD;  Location: [facility];  Service: Endoscopy;  Laterality: N/A;  . APPENDECTOMY    . AUGMENTATION MAMMAPLASTY    . BREAST BIOPSY     "I've had 2-3 bx; all benign"  . breast cystectomies     due to FCBD  . BREAST MASS EXCISION  01/2010   Left-fibrocystic change w/intraductal papilloma, no malignancy  . carotid US  [id number]   [id number] LICA, normal RICA  . [employer] Bunionectomy  11/04/08  Right Great Toe (Dr. Beola Cord)  . COLONOSCOPY  1998   nml (sigmoid-Gilbert-nol)  . Colonoscopy  01/04/04   polyps, diverticulosis/severe  . COLOSTOMY N/A 10/02/2013   DESCENDING COLOSTOMY;  Surgeon: Imogene Burn. Tsuei, MD  . Derrill Memo REVERSAL N/A 02/10/2014   [name], MD  . CT chest  12/2010   possible bronchiectasis lower lobes, 02/2011 - enlarged bilateral effusions, bibasilar atx  . CT chest  04/2011   no PE.  abnormal R hilar and mediastinal adenopathy --> on rpt 05/2011, resolved  . DEXA  03/28/99   osteopenia L/S  . DEXA  03/12/03   2.2 Spine 0.1 Hip  Osteopenia  . DEXA  11/14/05   1.9 Spine 0.3 Hip  slight improvement  . DEXA  11/22/06   improved osteo and fem neck  . DEXA  01/2013   T score -2.6 at spine  . EMG/MCV  10/16/01   + mild carpal tunnel  . ESOPHAGOGASTRODUODENOSCOPY  09/2014   small HH, irregular Z line, fundic gland polyps with  mild gastritis ([place])  . Flex laryngoscopy  06/11/06   ([place]) nml  . Head MRI, Head CT  10/1998   ? H/A focus ([place])  . ICD placement  07/14/11   w/pacemaker  . IMPLANTABLE CARDIOVERTER DEFIBRILLATOR IMPLANT N/A 07/14/2011   IMPLANTABLE CARDIOVERTER DEFIBRILLATOR IMPLANT;  Surgeon: Evans Lance, MD;  Location: [facility] CATH LAB;  Service: Cardiovascular;  Laterality: N/A;  . KNEE ARTHROSCOPY W/ ORIF     Left; "meniscus tear"  . KNEE SURGERY     left  . L/S films  11/21/01   nml; Right hip nml  . LASIK     left eye  . MANDIBLE SURGERY     "got about 6 pins in the bottom of my jaw"  . MANDIBLE SURGERY    . MRI Right Hip  12/12/01   Tendonitis Gluteus Medius to Gtr Trochanter Neg Bursitis (Dr. Eulas Post)  . MRI U/S  11/21/01   min. DDD L/3-4  . NCS  11/2014   L ulnar neuropathy, mild B median nerve entrapment ([place])  . OSTEOTOMY     Left foot  . PARTIAL COLECTOMY N/A 10/02/2013   PARTIAL COLECTOMY;  Surgeon: Imogene Burn. Tsuei, MD  . sinus surgery     x3 with balloon   . stress myoview  02/2011   no significant ischemia  . TOE AMPUTATION     "toe beside baby toe on left foot; got gangrene from corn"  . TONSILLECTOMY AND ADENOIDECTOMY    . US ECHOCARDIOGRAPHY  04/2011   mildly dilated LV, EF 25-30%, hypokinesis, restrictive physiology, mod MR, no AS   Family History  Problem Relation Age of Onset  . Lung cancer Father     + smoker  . Colon polyps Father   . Dementia Mother   . Osteoporosis Mother     Lumbar spine  . Stroke Mother     x 5 @ 2 YOA  . Arthritis Mother     hands  . Emphysema Mother   . Alcohol abuse Paternal Grandfather   . Stroke Paternal Grandfather     ?  Marland Kitchen Heart disease Paternal Grandfather   . Hypertension Maternal Grandfather     ?  Marland Kitchen Heart disease Paternal Grandmother   . Colon cancer Neg Hx   . Esophageal cancer Neg Hx   . Gallbladder disease Neg Hx    History  Sexual Activity  . Sexual activity: Not Currently    Outpatient Encounter  Prescriptions as of 05/23/2016  Medication Sig  . alendronate (FOSAMAX) 70 MG tablet Take 70 mg by mouth once a week. Patient takes every Thursday  . AMBULATORY NON FORMULARY MEDICATION Medication Name:  1 squatty potty to use as needed  . Ascorbic Acid (VITAMIN C) 1000 MG tablet Take 1,000 mg by mouth daily.  Marland Kitchen aspirin EC 81 MG tablet Take 81 mg by mouth daily.  . Calcium Carbonate-Vitamin D (CALCIUM + D PO) Take 1 tablet by mouth daily.  . carvedilol (COREG) 6.25 MG tablet TAKE 1 TABLET (6.25 MG TOTAL) BY MOUTH 2 (TWO) TIMES DAILY WITH A MEAL.  . chlorpheniramine-HYDROcodone (TUSSIONEX PENNKINETIC ER) 10-8 MG/5ML SUER Take 5 mLs by mouth every 12 (twelve) hours as needed for cough.  . dextromethorphan (DELSYM) 30 MG/5ML liquid Take 15 mg by mouth at bedtime as needed for cough.  . doxycycline (VIBRAMYCIN) 100 MG capsule Take 1 capsule (100 mg total) by mouth 2 (two) times daily. (Patient taking differently: Take 100 mg by mouth daily. )  . ezetimibe (ZETIA) 10 MG tablet TAKE 1 TABLET (10 MG TOTAL) BY MOUTH DAILY.  Marland Kitchen Ferrous Sulfate 143 (45 Fe) MG TBCR Take 1 tablet by mouth every other day.  Marland Kitchen omeprazole (PRILOSEC) 40 MG capsule TAKE ONE CAPSULE BY MOUTH TWICE A DAY BEFORE MEALS  . potassium chloride (K-DUR) 10 MEQ tablet Take 10 mEq by mouth as directed. Take daily as needed  . Probiotic Product (PROBIOTIC DAILY) CAPS Take 1 capsule by mouth daily.  . Pyridoxine HCl (VITAMIN B6 PO) Take 1 capsule by mouth daily. Reported on 07/12/2015  . spironolactone (ALDACTONE) 12.5 mg TABS tablet Take 12.5 mg by mouth daily.  Marland Kitchen torsemide (DEMADEX) 10 MG tablet Take 10 mg by mouth daily as needed (swelling/fluid).  . triamcinolone (NASACORT) 55 MCG/ACT AERO nasal inhaler Place 2 sprays into the nose 2 (two) times daily.  . valsartan (DIOVAN) 80 MG tablet Take 1 tablet (80 mg total) by mouth daily.  . vitamin E 100 UNIT capsule Take 100 Units by mouth daily.   No facility-administered encounter medications  on file as of 05/23/2016.     Activities of Daily Living In your present state of health, do you have any difficulty performing the following activities: 05/23/2016  Hearing? Y  Vision? Y  Difficulty concentrating or making decisions? Y  Walking or climbing stairs? N  Dressing or bathing? N  Doing errands, shopping? N  Preparing Food and eating ? N  Using the Toilet? N  In the past six months, have you accidently leaked urine? N  Do you have problems with loss of bowel control? Y  Managing your Medications? N  Managing your Finances? N  Housekeeping or managing your Housekeeping? N  Some recent data might be hidden    Patient Care Team: [name], MD as PCP - General [name], MD as Referring Physician (Family Medicine) Lafayette Dragon, MD (Inactive) as Consulting Physician (Gastroenterology) Evans Lance, MD as Consulting Physician (Cardiology) Minna Merritts, MD as Consulting Physician (Cardiology)    Assessment:     Hearing Screening   125Hz  250Hz  500Hz  1000Hz  2000Hz  3000Hz  4000Hz  6000Hz  8000Hz   Right ear:   40 40 40  40    Left ear:   40 40 40  0    Vision Screening Comments: Last vision exam with Dr. Sandra Cockayne in 2017   Exercise Activities and Dietary recommendations Current Exercise Habits: The patient does not participate in regular exercise at present (pt  does exercise sometimes for 20 min 1-2 days per week), Exercise limited by: None identified  Goals    . Increase physical activity          Starting 05/23/16, I will continue to exercise at least 20 min twice weekly as tolerated.      Fall Risk Fall Risk  05/23/2016 06/08/2015  Falls in the past year? Yes Yes  Number falls in past yr: 2 or more 2 or more  Injury with Fall? No Yes  Risk for fall due to : Impaired vision;History of fall(s);Impaired balance/gait -   Depression Screen PHQ 2/9 Scores 05/23/2016 06/08/2015  PHQ - 2 Score 0 0     Cognitive Function MMSE - Mini Mental State Exam  05/23/2016  Orientation to time 5  Orientation to Place 5  Registration 3  Attention/ Calculation 0  Recall 3  Language- name 2 objects 0  Language- repeat 1  Language- follow 3 step command 3  Language- read & follow direction 0  Write a sentence 0  Copy design 0  Total score 20       PLEASE NOTE: A Mini-Cog screen was completed. Maximum score is 20. A value of 0 denotes this part of [place] MMSE was not completed or the patient failed this part of the Mini-Cog screening.   Mini-Cog Screening Orientation to Time - Max 5 pts Orientation to Place - Max 5 pts Registration - Max 3 pts Recall - Max 3 pts Language Repeat - Max 1 pts Language Follow 3 Step Command - Max 3 pts   Immunization History  Administered Date(s) Administered  . Pneumococcal Conjugate-13 06/08/2015  . Pneumococcal Polysaccharide-23 02/16/2012  . Td 11/12/2005   Screening Tests Health Maintenance  Topic Date Due  . INFLUENZA VACCINE  08/12/2016 (Originally 12/14/2015)  . ZOSTAVAX  05/23/2017 (Originally 07/18/2005)  . Hepatitis C Screening  05/23/2017 (Originally 03-08-1946)  . COLONOSCOPY  01/03/2024 (Originally 01/03/2014)  . DTaP/Tdap/Td (1 - Tdap) 11/11/2025 (Originally 11/13/2005)  . TETANUS/TDAP  11/11/2025 (Originally 11/13/2015)  . MAMMOGRAM  01/13/2017  . DEXA SCAN  Completed  . PNA vac Low Risk Adult  Completed      Plan:     I have personally reviewed and addressed the Medicare Annual Wellness questionnaire and have noted the following in the patient's chart:  A. Medical and social history B. Use of alcohol, tobacco or illicit drugs  C. Current medications and supplements D. Functional ability and status E.  Nutritional status F.  Physical activity G. Advance directives H. List of other physicians I.  Hospitalizations, surgeries, and ER visits in previous 12 months J.  [place] to include hearing, vision, cognitive, depression L. Referrals and appointments - none  In addition,  I have reviewed and discussed with patient certain preventive protocols, quality metrics, and best practice recommendations. A written personalized care plan for preventive services as well as general preventive health recommendations were provided to patient.  See attached scanned questionnaire for additional information.   Signed,   Lindell Noe, MHA, BS, LPN Health Coach

## 2016-05-23 NOTE — Patient Instructions (Addendum)
Ashley [name] , Thank you for taking time to come for your Medicare Wellness Visit. I appreciate your ongoing commitment to your health goals. Please review the following plan we discussed and let me know if I can assist you in the future.   These are the goals we discussed: Goals    . Increase physical activity          Starting 05/23/16, I will continue to exercise at least 20 min twice weekly as tolerated.       This is a list of the screening recommended for you and due dates:  Health Maintenance  Topic Date Due  . Flu Shot  08/12/2016*  . Shingles Vaccine  05/23/2017*  .  Hepatitis C: One time screening is recommended by Center for Disease Control  (CDC) for  adults born from [birth year] through 1965.   05/23/2017*  . Colon Cancer Screening  01/03/2024*  . DTaP/Tdap/Td vaccine (1 - Tdap) 11/11/2025*  . Tetanus Vaccine  11/11/2025*  . Mammogram  01/13/2017  . DEXA scan (bone density measurement)  Completed  . Pneumonia vaccines  Completed  *Topic was postponed. The date shown is not the original due date.   Preventive Care for Adults  A healthy lifestyle and preventive care can promote health and wellness. Preventive health guidelines for adults include the following key practices.  . A routine yearly physical is a good way to check with your health care provider about your health and preventive screening. It is a chance to share any concerns and updates on your health and to receive a thorough exam.  . Visit your dentist for a routine exam and preventive care every 6 months. Brush your teeth twice a day and floss once a day. Good oral hygiene prevents tooth decay and gum disease.  . The frequency of eye exams is based on your age, health, family medical history, use  of contact lenses, and other factors. Follow your health care provider's ecommendations for frequency of eye exams.  . Eat a healthy diet. Foods like vegetables, fruits, whole grains, low-fat dairy products, and lean  protein foods contain the nutrients you need without too many calories. Decrease your intake of foods high in solid fats, added sugars, and salt. Eat the right amount of calories for you. Get information about a proper diet from your health care provider, if necessary.  . Regular physical exercise is one of the most important things you can do for your health. Most adults should get at least 150 minutes of moderate-intensity exercise (any activity that increases your heart rate and causes you to sweat) each week. In addition, most adults need muscle-strengthening exercises on 2 or more days a week.  Silver Sneakers may be a benefit available to you. To determine eligibility, you may visit the website: www.silversneakers.com or contact program at (260) 332-5001 Mon-Fri between 8AM-8PM.   . Maintain a healthy weight. The body mass index (BMI) is a screening tool to identify possible weight problems. It provides an estimate of body fat based on height and weight. Your health care provider can find your BMI and can help you achieve or maintain a healthy weight.   For adults 20 years and older: ? A BMI below 18.5 is considered underweight. ? A BMI of 18.5 to 24.9 is normal. ? A BMI of 25 to 29.9 is considered overweight. ? A BMI of 30 and above is considered obese.   . Maintain normal blood lipids and cholesterol levels by exercising and minimizing  your intake of saturated fat. Eat a balanced diet with plenty of fruit and vegetables. Blood tests for lipids and cholesterol should begin at age 65 and be repeated every 5 years. If your lipid or cholesterol levels are high, you are over 50, or you are at high risk for heart disease, you may need your cholesterol levels checked more frequently. Ongoing high lipid and cholesterol levels should be treated with medicines if diet and exercise are not working.  . If you smoke, find out from your health care provider how to quit. If you do not use tobacco, please  do not start.  . If you choose to drink alcohol, please do not consume more than 2 drinks per day. One drink is considered to be 12 ounces (355 mL) of beer, 5 ounces (148 mL) of wine, or 1.5 ounces (44 mL) of liquor.  . If you are 57-39 years old, ask your health care provider if you should take aspirin to prevent strokes.  . Use sunscreen. Apply sunscreen liberally and repeatedly throughout the day. You should seek shade when your shadow is shorter than you. Protect yourself by wearing long sleeves, pants, a wide-brimmed hat, and sunglasses year round, whenever you are outdoors.  . Once a month, do a whole body skin exam, using a mirror to look at the skin on your back. Tell your health care provider of new moles, moles that have irregular borders, moles that are larger than a pencil eraser, or moles that have changed in shape or color.

## 2016-05-23 NOTE — Assessment & Plan Note (Signed)
Update labs on zetia.

## 2016-05-23 NOTE — Assessment & Plan Note (Addendum)
Anticipate predominant culprit of bowel changes although she does have significant organic pathology as well. Start colace 100mg  daily. Limited in amt fiber and fluid due to other medical conditions

## 2016-05-23 NOTE — Assessment & Plan Note (Signed)
Discussed miralax dosing. rec start colace 100mg  daily, hold for diarrhea. Limited in fiber and fluid due to other medical conditions.

## 2016-05-23 NOTE — Progress Notes (Signed)
Pre visit review using our clinic review tool, if applicable. No additional management support is needed unless otherwise documented below in the visit note. 

## 2016-05-23 NOTE — Progress Notes (Signed)
BP 124/78 (BP Location: Right Arm, Patient Position: Sitting, Cuff Size: Normal)   Pulse 76   Temp 97.6 F (36.4 C) (Oral)   Ht 5\' 5"  (1.651 m) Comment: no shoes  Wt 177 lb 8 oz (80.5 kg)   SpO2 98%   BMI 29.54 kg/m    CC: diarrhea Subjective:    Patient ID: Ashley [name], female    DOB: 1945/08/06, 71 y.o.   MRN: [id number]  HPI: Ashley [name] is a 71 y.o. female presenting on 05/23/2016 for Medicare Wellness and Diarrhea   Saw Katha Cabal today for medicare wellness visit, note will be reviewed.  Seen today for acute visit.   4 wks of diarrhea until christmas. Over the last week, she has now struggled with constipation. Treating with 1/2 capful of miralax. Eating prunes as well. Feels impacted - has to manually pick out stool at times. Ongoing abdominal discomfort from known hernias and likely adhesions from multiple abdominal surgeries. Last BM yesterday - small and firm. Passing gas well - increased gassiness. Recent anal manometry showed weakened internal and external sphincters.   Has seen GI - no improvement with IBgard - increased pain, PFPT - no improvement so discharged early. She also was treated with expensive antibiotic without improvement. No longer taking align either.   No objective fevers. No blood in stool.  Currently on doxycycline for acute upper respiratory infection, she has decreased to once daily due to concerns over constipation.   Relevant past medical, surgical, family and social history reviewed and updated as indicated. Interim medical history since our last visit reviewed. Allergies and medications reviewed and updated. Current Outpatient Prescriptions on File Prior to Visit  Medication Sig  . alendronate (FOSAMAX) 70 MG tablet Take 70 mg by mouth once a week. Patient takes every Thursday  . AMBULATORY NON FORMULARY MEDICATION Medication Name:  1 squatty potty to use as needed  . Ascorbic Acid (VITAMIN C) 1000 MG tablet Take 1,000 mg by mouth  daily.  Marland Kitchen aspirin EC 81 MG tablet Take 81 mg by mouth daily.  . Calcium Carbonate-Vitamin D (CALCIUM + D PO) Take 1 tablet by mouth daily.  . carvedilol (COREG) 6.25 MG tablet TAKE 1 TABLET (6.25 MG TOTAL) BY MOUTH 2 (TWO) TIMES DAILY WITH A MEAL.  . chlorpheniramine-HYDROcodone (TUSSIONEX PENNKINETIC ER) 10-8 MG/5ML SUER Take 5 mLs by mouth every 12 (twelve) hours as needed for cough.  . dextromethorphan (DELSYM) 30 MG/5ML liquid Take 15 mg by mouth at bedtime as needed for cough.  . doxycycline (VIBRAMYCIN) 100 MG capsule Take 1 capsule (100 mg total) by mouth 2 (two) times daily. (Patient taking differently: Take 100 mg by mouth daily. )  . ezetimibe (ZETIA) 10 MG tablet TAKE 1 TABLET (10 MG TOTAL) BY MOUTH DAILY.  Marland Kitchen Ferrous Sulfate 143 (45 Fe) MG TBCR Take 1 tablet by mouth every other day.  . potassium chloride (K-DUR) 10 MEQ tablet Take 10 mEq by mouth as directed. Take daily as needed  . Probiotic Product (PROBIOTIC DAILY) CAPS Take 1 capsule by mouth daily.  . Pyridoxine HCl (VITAMIN B6 PO) Take 1 capsule by mouth daily. Reported on 07/12/2015  . spironolactone (ALDACTONE) 12.5 mg TABS tablet Take 12.5 mg by mouth daily.  Marland Kitchen torsemide (DEMADEX) 10 MG tablet Take 10 mg by mouth daily as needed (swelling/fluid).  . triamcinolone (NASACORT) 55 MCG/ACT AERO nasal inhaler Place 2 sprays into the nose 2 (two) times daily.  . valsartan (DIOVAN) 80 MG tablet Take  1 tablet (80 mg total) by mouth daily.  . vitamin E 100 UNIT capsule Take 100 Units by mouth daily.  . [DISCONTINUED] bisoprolol (ZEBETA) 5 MG tablet Take 5 mg by mouth daily.   No current facility-administered medications on file prior to visit.     Review of Systems Per HPI unless specifically indicated in ROS section     Objective:    BP 124/78 (BP Location: Right Arm, Patient Position: Sitting, Cuff Size: Normal)   Pulse 76   Temp 97.6 F (36.4 C) (Oral)   Ht 5\' 5"  (1.651 m) Comment: no shoes  Wt 177 lb 8 oz (80.5 kg)    SpO2 98%   BMI 29.54 kg/m   Wt Readings from Last 3 Encounters:  05/23/16 177 lb 8 oz (80.5 kg)  05/23/16 177 lb 8 oz (80.5 kg)  05/18/16 179 lb 12 oz (81.5 kg)    Physical Exam  Constitutional: She appears well-developed and well-nourished. No distress.  HENT:  Mouth/Throat: Oropharynx is clear and moist. No oropharyngeal exudate.  Cardiovascular: Normal rate, regular rhythm, normal heart sounds and intact distal pulses.   No murmur heard. Pulmonary/Chest: Effort normal and breath sounds normal. No respiratory distress. She has no wheezes. She has no rales.  Abdominal: Soft. Normal appearance and bowel sounds are normal. She exhibits no distension and no mass. There is no hepatosplenomegaly. There is generalized tenderness (mild -mod, chronic). There is no rigidity, no rebound, no guarding, no CVA tenderness and negative Murphy's sign.  Several well healed incisional scars present on abdomen  Genitourinary: Rectal exam shows internal hemorrhoid and anal tone abnormal (diminished). Rectal exam shows no external hemorrhoid, no fissure, no mass and no tenderness.  Genitourinary Comments: No significant stool in rectal vault - she did have small piece of firm stool that was removed   Musculoskeletal: She exhibits no edema.  Skin: Skin is warm and dry. No rash noted.  Psychiatric: She has a normal mood and affect.  Nursing note and vitals reviewed.  Results for orders placed or performed in visit on 0000000  Basic metabolic panel  Result Value Ref Range   Sodium 141 135 - 145 mEq/L   Potassium 4.4 3.5 - 5.1 mEq/L   Chloride 104 96 - 112 mEq/L   CO2 31 19 - 32 mEq/L   Glucose, Bld 77 70 - 99 mg/dL   BUN 19 6 - 23 mg/dL   Creatinine, Ser 1.12 0.40 - 1.20 mg/dL   Calcium 9.8 8.4 - 10.5 mg/dL   GFR 50.99 (L) >60.00 mL/min      Assessment & Plan:   Problem List Items Addressed This Visit    Change in bowel habits - Primary    Discussed miralax dosing. rec start colace 100mg  daily,  hold for diarrhea. Limited in fiber and fluid due to other medical conditions.       Relevant Orders   DG Abd 2 Views   Comprehensive metabolic panel   CBC with Differential/Platelet   Diarrhea    IBS-mixed related.      History of diverticular abscess   HYPERCHOLESTEROLEMIA, PURE    Update labs on zetia.      Relevant Orders   Lipid panel   TSH   Irritable bowel syndrome    Anticipate predominant culprit of bowel changes although she does have significant organic pathology as well. Start colace 100mg  daily. Limited in amt fiber and fluid due to other medical conditions      Relevant Medications  docusate sodium (COLACE) 100 MG capsule   omeprazole (PRILOSEC) 40 MG capsule   Left sided abdominal pain    Chronic.       Vitamin D deficiency   Relevant Orders   VITAMIN D 25 Hydroxy (Vit-D Deficiency, Fractures)    Other Visit Diagnoses    Need for hepatitis C screening test       Relevant Orders   Hepatitis C antibody       Follow up plan: No Follow-up on file.  [name], MD

## 2016-05-24 LAB — HEPATITIS C ANTIBODY: HCV Ab: NEGATIVE

## 2016-05-26 LAB — CUP PACEART REMOTE DEVICE CHECK
Battery Voltage: 3.05 V
Brady Statistic RV Percent Paced: 0.01 %
Date Time Interrogation Session: 20180103153730
HighPow Impedance: 72 Ohm
Implantable Lead Implant Date: 20130301
Implantable Lead Location: 753860
Implantable Lead Model: 6935
Implantable Pulse Generator Implant Date: 20130301
Lead Channel Impedance Value: 437 Ohm
Lead Channel Pacing Threshold Amplitude: 0.75 V
Lead Channel Pacing Threshold Pulse Width: 0.4 ms
Lead Channel Sensing Intrinsic Amplitude: 6.375 mV
Lead Channel Sensing Intrinsic Amplitude: 6.375 mV
Lead Channel Setting Pacing Amplitude: 2.5 V
Lead Channel Setting Pacing Pulse Width: 0.4 ms
Lead Channel Setting Sensing Sensitivity: 0.3 mV

## 2016-05-28 IMAGING — CT CT ABD-PELV W/ CM
2 of 5 series · 15 of 46 positions shown, 17 images · IV contrast (Omni 300)
Comparison: CT of the abdomen and pelvis September 30, 2013

CLINICAL DATA: Abdominal pain for 1 week, vomiting beginning today.
Mid abdominal tenderness to palpation. History of surgery/bowel
resection, diverticulitis.

EXAM:
CT ABDOMEN AND PELVIS WITH CONTRAST
TECHNIQUE: Multidetector CT imaging of the abdomen and pelvis was performed
using the standard protocol following bolus administration of
intravenous contrast.
CONTRAST:  100 cc Omnipaque 300

[Series 2: abd/ pelvis 5.0 i30f 1 · axial · 0.76mm/px · z∈[+1077,+1497]mm · 12 of 96 slices shown, 14 images]
[im 6/96  soft-tissue]
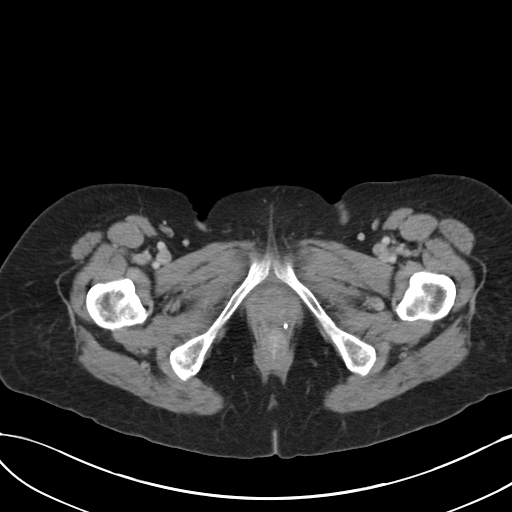
[im 6/96  bone]
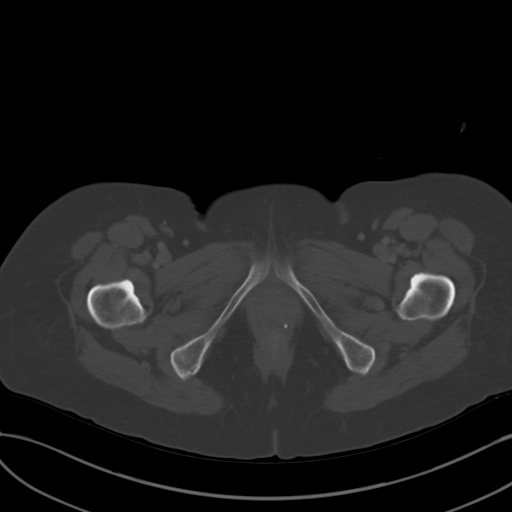
[im 16/96  soft-tissue]
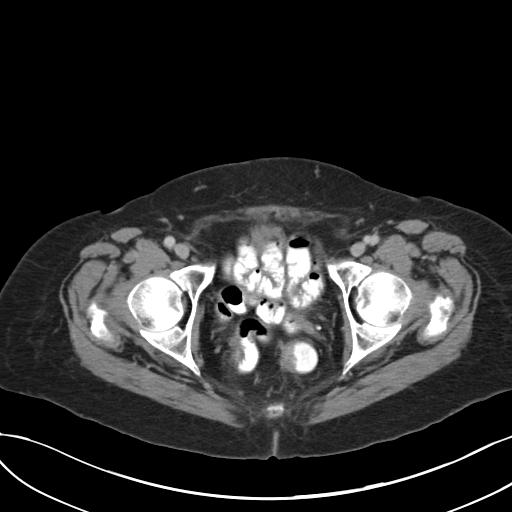
[im 22/96  soft-tissue]
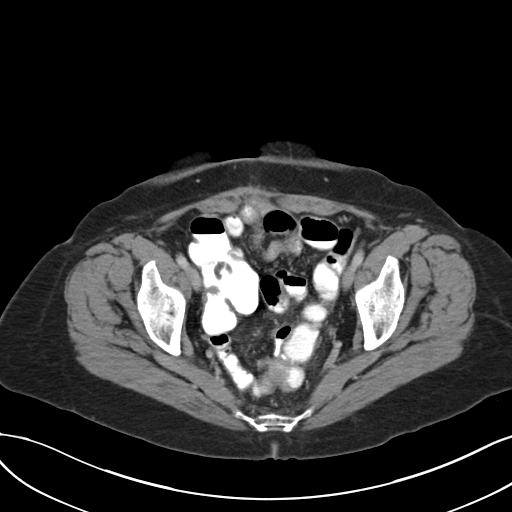
[im 27/96  soft-tissue]
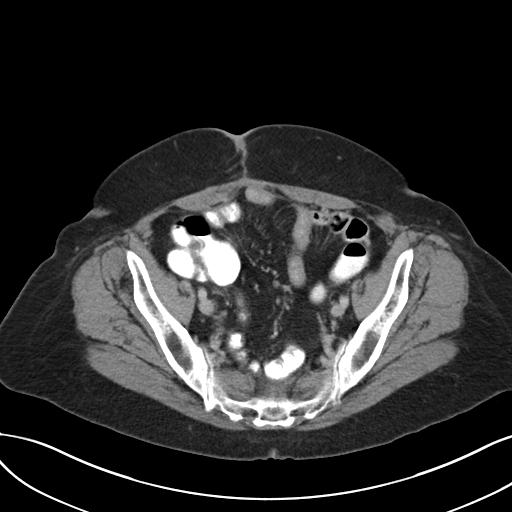
[im 37/96  soft-tissue]
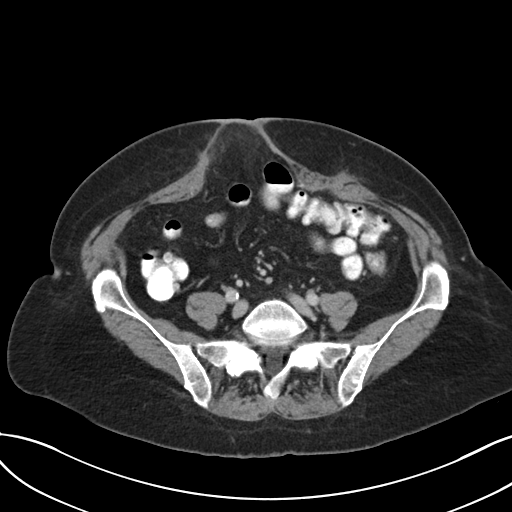
[im 43/96  soft-tissue]
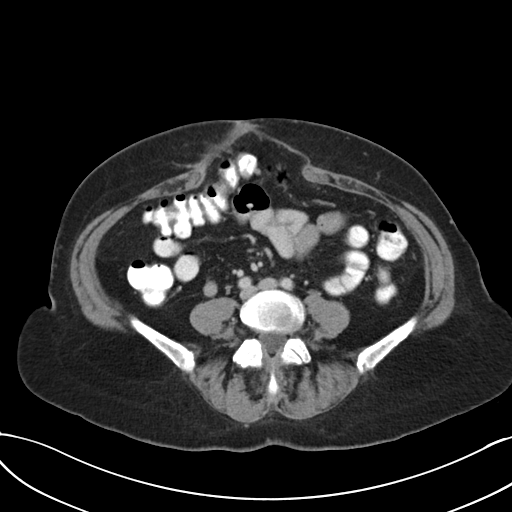
[im 53/96  soft-tissue]
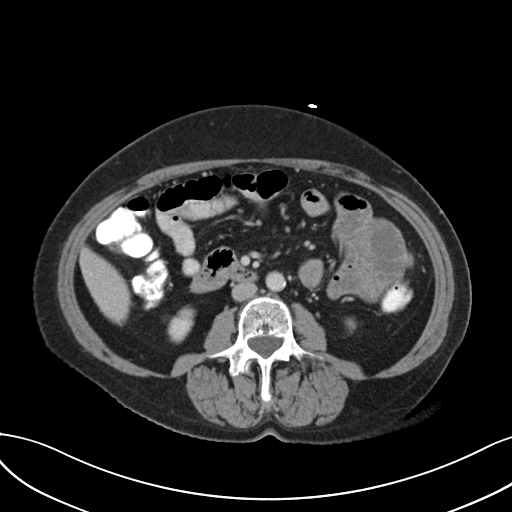
[im 59/96  soft-tissue]
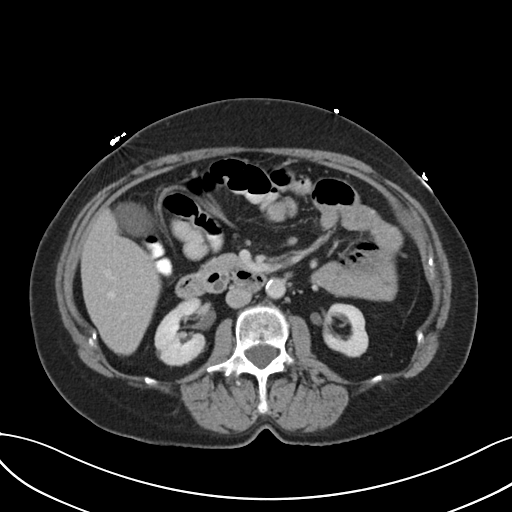
[im 69/96  soft-tissue]
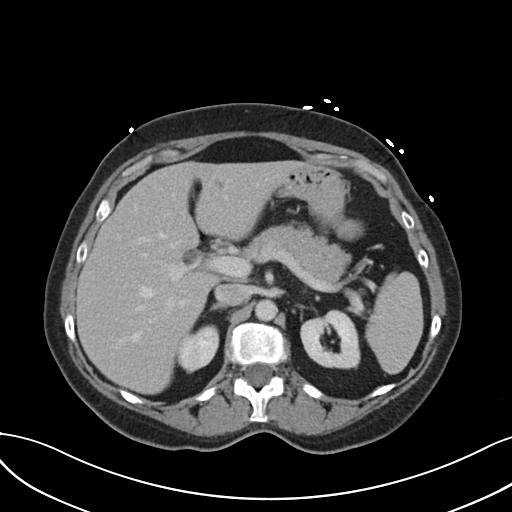
[im 69/96  bone]
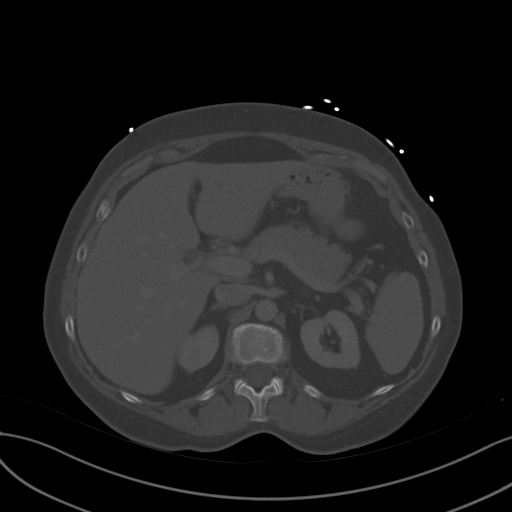
[im 74/96  soft-tissue]
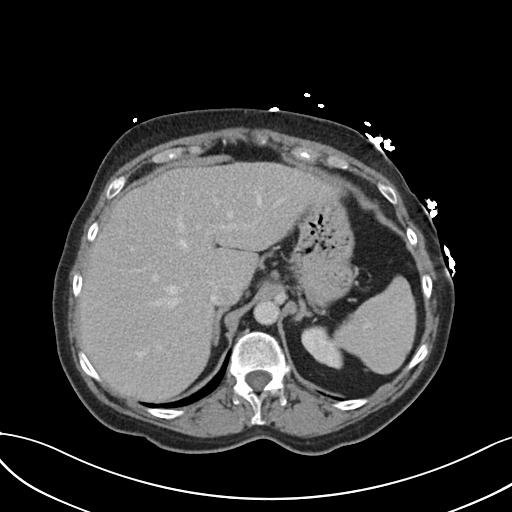
[im 80/96  soft-tissue]
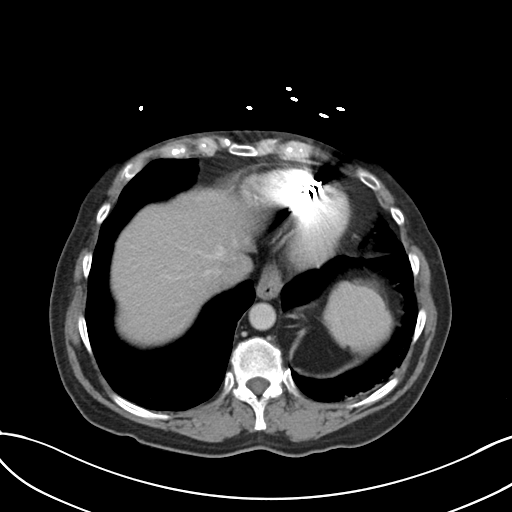
[im 90/96  soft-tissue]
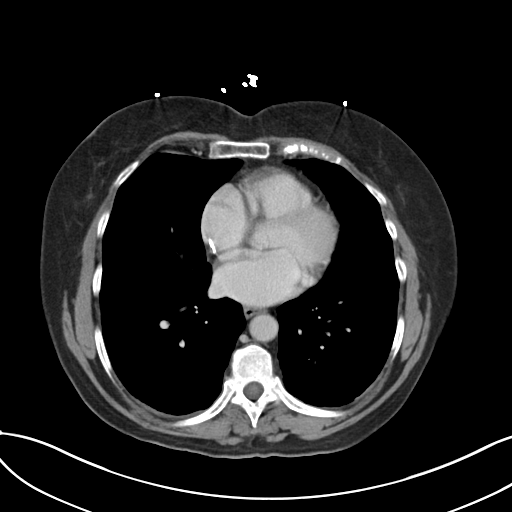

[Series 5: coronals · coronal · 0.70mm/px · 3 of 181 slices shown]
[im 61/181  soft-tissue]
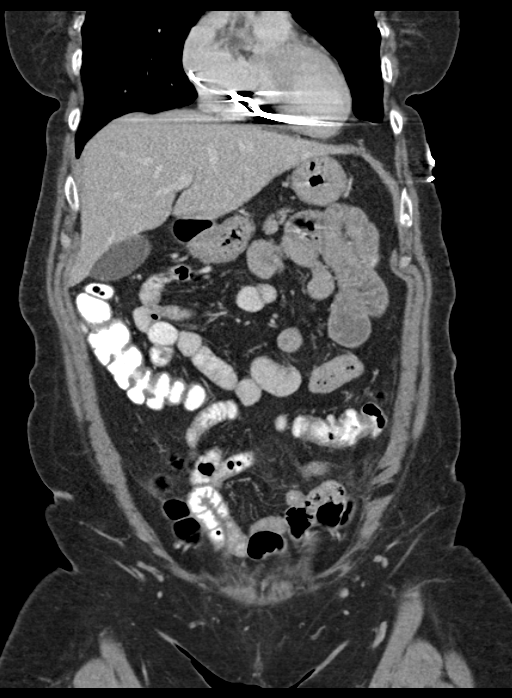
[im 81/181  soft-tissue]
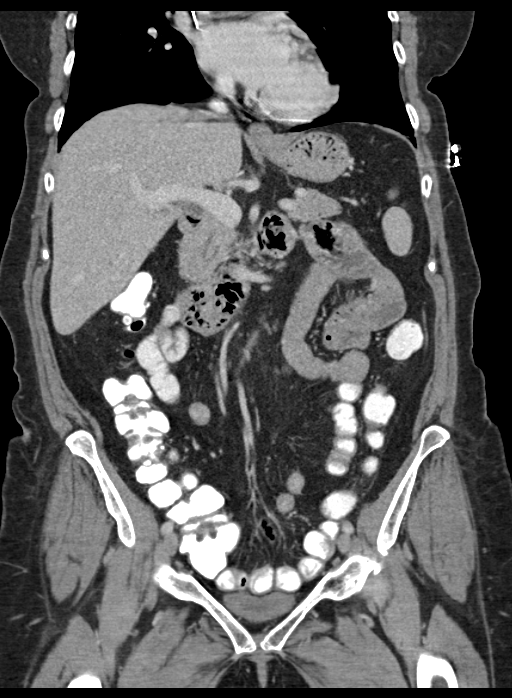
[im 101/181  soft-tissue]
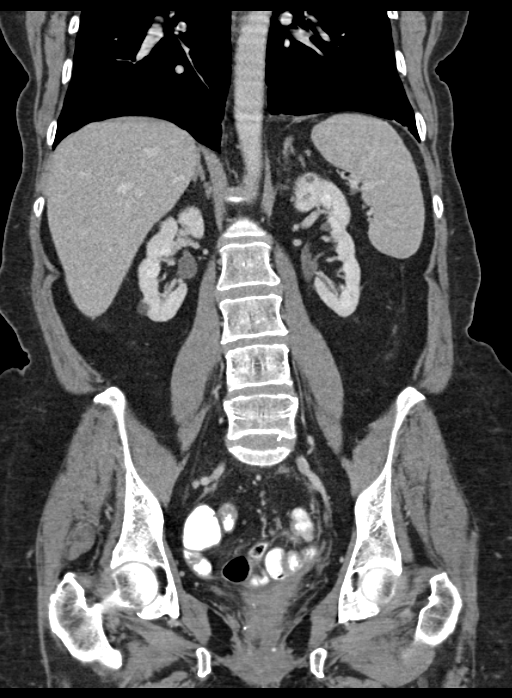

[15 of 46 positions shown; findings below may reference images not displayed]

FINDINGS: LUNG BASES: Included view of the lung bases are clear. Partially
imaged cardiac defibrillator leads. Visualized heart and pericardium
are unremarkable.

SOLID ORGANS: The liver, spleen, gallbladder, and adrenal glands are
unremarkable. Parallel apparent biliary structures in the pancreatic
head suggest pancreatic divisum.

GASTROINTESTINAL TRACT: Small hiatal hernia. Rectosigmoid surgical
anastomosis. Mild colonic diverticulosis. The stomach, small and
large bowel are overall normal in course and caliber without
inflammatory changes.

KIDNEYS/ URINARY TRACT: Kidneys are orthotopic, demonstrating
symmetric enhancement. No nephrolithiasis, hydronephrosis or solid
renal masses. Too small to characterize hypodensities in the kidneys
bilaterally. Stable exophytic 11 mm cyst arising from the lower pole
of RIGHT kidney. The unopacified ureters are normal in course and
caliber. Delayed imaging through the kidneys demonstrates symmetric
prompt contrast excretion within the proximal urinary collecting
system. Urinary bladder is partially distended, circumferential wall
thickening.

PERITONEUM/RETROPERITONEUM: No intraperitoneal free fluid nor free
air. Aortoiliac vessels are normal in course and caliber, mild
calcific atherosclerosis. No lymphadenopathy by CT size criteria.
Status post hysterectomy.

SOFT TISSUE/OSSEOUS STRUCTURES: Nonsuspicious. Rectus abdominis
diastases/very wide necked hernia with scarring.
IMPRESSION: Mild urinary bladder wall thickening likely reflects under
distention though, cystitis could have a similar appearance.

Interval partial colectomy with rectosigmoid bowel anastomosis, no
CT findings of acute diverticulitis or bowel obstruction.

  By: Myeisha Schall

## 2016-05-28 NOTE — Progress Notes (Signed)
I reviewed health advisor's note, was available for consultation, and agree with documentation and plan.  

## 2016-06-07 ENCOUNTER — Ambulatory Visit: Payer: Medicare Other

## 2016-06-07 ENCOUNTER — Other Ambulatory Visit: Payer: Medicare Other

## 2016-06-07 DIAGNOSIS — M238X2 Other internal derangements of left knee: Secondary | ICD-10-CM | POA: Diagnosis not present

## 2016-06-07 DIAGNOSIS — M238X1 Other internal derangements of right knee: Secondary | ICD-10-CM | POA: Diagnosis not present

## 2016-06-07 DIAGNOSIS — M25562 Pain in left knee: Secondary | ICD-10-CM | POA: Diagnosis not present

## 2016-06-07 DIAGNOSIS — M25561 Pain in right knee: Secondary | ICD-10-CM | POA: Diagnosis not present

## 2016-06-12 ENCOUNTER — Ambulatory Visit (INDEPENDENT_AMBULATORY_CARE_PROVIDER_SITE_OTHER): Payer: Medicare Other | Admitting: Family Medicine

## 2016-06-12 ENCOUNTER — Encounter: Payer: Self-pay | Admitting: Family Medicine

## 2016-06-12 ENCOUNTER — Telehealth: Payer: Self-pay | Admitting: Family Medicine

## 2016-06-12 VITALS — BP 112/72 | HR 88 | Temp 97.7°F | Wt 182.8 lb

## 2016-06-12 DIAGNOSIS — E785 Hyperlipidemia, unspecified: Secondary | ICD-10-CM

## 2016-06-12 DIAGNOSIS — M797 Fibromyalgia: Secondary | ICD-10-CM

## 2016-06-12 DIAGNOSIS — R51 Headache: Secondary | ICD-10-CM

## 2016-06-12 DIAGNOSIS — R5382 Chronic fatigue, unspecified: Secondary | ICD-10-CM | POA: Diagnosis not present

## 2016-06-12 DIAGNOSIS — K2970 Gastritis, unspecified, without bleeding: Secondary | ICD-10-CM

## 2016-06-12 DIAGNOSIS — M810 Age-related osteoporosis without current pathological fracture: Secondary | ICD-10-CM

## 2016-06-12 DIAGNOSIS — R5381 Other malaise: Secondary | ICD-10-CM

## 2016-06-12 DIAGNOSIS — R109 Unspecified abdominal pain: Secondary | ICD-10-CM

## 2016-06-12 DIAGNOSIS — K582 Mixed irritable bowel syndrome: Secondary | ICD-10-CM | POA: Diagnosis not present

## 2016-06-12 DIAGNOSIS — I5022 Chronic systolic (congestive) heart failure: Secondary | ICD-10-CM

## 2016-06-12 DIAGNOSIS — R519 Headache, unspecified: Secondary | ICD-10-CM

## 2016-06-12 DIAGNOSIS — N183 Chronic kidney disease, stage 3 unspecified: Secondary | ICD-10-CM

## 2016-06-12 MED ORDER — RED YEAST RICE 600 MG PO CAPS: 1.0000 | ORAL_CAPSULE | Freq: Two times a day (BID) | ORAL | Status: DC

## 2016-06-12 NOTE — Assessment & Plan Note (Addendum)
Averages torsemide tablet about 4 times a week. 5lb weight gain, however seems euvolemic. Reviewed possible diet indiscretions that could account for weight gain. Will continue close monitoring.

## 2016-06-12 NOTE — Assessment & Plan Note (Signed)
Continue omeprazole 40mg  daily with 2nd PRN.

## 2016-06-12 NOTE — Assessment & Plan Note (Signed)
Ongoing trouble, no significant improvement with miralax/colace regimen but overall stable period - continue current regimen.  Limited in fiber/fluids due to medical conditions.

## 2016-06-12 NOTE — Assessment & Plan Note (Addendum)
Chronic, h/o diverticulitis s/p partial colectomy.  Seems to be doing well on once daily protonix with PRN 2nd dose if needed.

## 2016-06-12 NOTE — Assessment & Plan Note (Signed)
Deteriorated readings reviewed with patient - as well as healthy diet choices to help control cholesterol levels.  Continue zetia, add red yeast rice as well as diet changes reviewed (less added sugars, more fatty fish).

## 2016-06-12 NOTE — Progress Notes (Signed)
Pre visit review using our clinic review tool, if applicable. No additional management support is needed unless otherwise documented below in the visit note. 

## 2016-06-12 NOTE — Assessment & Plan Note (Signed)
Chronic, stable. Continue to monitor.  

## 2016-06-12 NOTE — Assessment & Plan Note (Signed)
Reviewed with patient latest readings. Pt has restarted fosamax weekly, continues calcium/vit D plus vit D 2000 IU daily. Discussed bisphosphonate dosing.

## 2016-06-12 NOTE — Progress Notes (Signed)
BP 112/72   Pulse 88   Temp 97.7 F (36.5 C) (Oral)   Wt 182 lb 12 oz (82.9 kg)   BMI 30.41 kg/m    CC: f/u medicare wellness visit Subjective:    Patient ID: Ashley [name], female    DOB: 03-01-1946, 71 y.o.   MRN: [id number]  HPI: Ashley [name] is a 71 y.o. female presenting on 06/12/2016 for Annual Exam (restarted xanax) and Multiple issues   Saw Katha Cabal earlier this month for medicare wellness visit. Note reviewed.   See prior note for details. We discussed miralax dosing (1/2 capful every other day PRN) and started colace 100mg  daily, hold for diarrhea. We also decreased omeprazole to 40mg  once daily. She stopped miralax and colace - wasn't effective.  ESOPHAGOGASTRODUODENOSCOPY 09/2014; small HH, irregular Z line, fundic gland polyps with mild gastritis Olevia Perches).   5 lb weight gain noted.   Discussed ongoing headaches, left sided sore throat, ongoing abdominal discomfort, and sinus irritation and drainage.   Preventative: Colon cancer screening - ColoGuard 04/2015 negative.  Lung cancer screening - not indicated Breast cancer screening - normal 01/2014 and per patient normal 01/2016 Well woman exam - s/p abd hysterectomy with BSO, sees [facility]  DEXA Date: 01/2016 T score -2.7 (OBGYN) OSTEOPOROSIS.  Flu shot - declines - egg allergy Td 2007 Pneumovax 2013, prevnar 2017 Shingles shot - declines Advanced directive discussion - has at home. Husband is HCPOA. Will bring me copy Seat belt use discussed Sunscreen use and skin screen discussed. No changing moles on skin. Upcoming appt with derm.  Non smoker Alcohol - none  Lives with husband. 1 adopted child.  Activity: no regular exercise Diet: seldom water, good fruits/vegetables   Relevant past medical, surgical, family and social history reviewed and updated as indicated. Interim medical history since our last visit reviewed. Allergies and medications reviewed and updated. Current Outpatient  Prescriptions on File Prior to Visit  Medication Sig  . alendronate (FOSAMAX) 70 MG tablet Take 70 mg by mouth once a week. Patient takes every Thursday  . AMBULATORY NON FORMULARY MEDICATION Medication Name:  1 squatty potty to use as needed  . Ascorbic Acid (VITAMIN C) 1000 MG tablet Take 1,000 mg by mouth daily.  Marland Kitchen aspirin EC 81 MG tablet Take 81 mg by mouth daily.  . Calcium Carbonate-Vitamin D (CALCIUM + D PO) Take 1 tablet by mouth daily.  . carvedilol (COREG) 6.25 MG tablet TAKE 1 TABLET (6.25 MG TOTAL) BY MOUTH 2 (TWO) TIMES DAILY WITH A MEAL.  Marland Kitchen dextromethorphan (DELSYM) 30 MG/5ML liquid Take 15 mg by mouth at bedtime as needed for cough.  . docusate sodium (COLACE) 100 MG capsule Take 100 mg by mouth daily.  Marland Kitchen ezetimibe (ZETIA) 10 MG tablet TAKE 1 TABLET (10 MG TOTAL) BY MOUTH DAILY.  Marland Kitchen Ferrous Sulfate 143 (45 Fe) MG TBCR Take 1 tablet by mouth every other day.  Marland Kitchen omeprazole (PRILOSEC) 40 MG capsule Take 1 capsule (40 mg total) by mouth daily.  . potassium chloride (K-DUR) 10 MEQ tablet Take 10 mEq by mouth as directed. Take daily as needed  . Probiotic Product (PROBIOTIC DAILY) CAPS Take 1 capsule by mouth daily.  . Pyridoxine HCl (VITAMIN B6 PO) Take 1 capsule by mouth daily. Reported on 07/12/2015  . spironolactone (ALDACTONE) 12.5 mg TABS tablet Take 12.5 mg by mouth daily.  Marland Kitchen torsemide (DEMADEX) 10 MG tablet Take 10 mg by mouth daily as needed (swelling/fluid).  . triamcinolone (NASACORT) 55  MCG/ACT AERO nasal inhaler Place 2 sprays into the nose 2 (two) times daily.  . valsartan (DIOVAN) 80 MG tablet Take 1 tablet (80 mg total) by mouth daily.  . vitamin E 100 UNIT capsule Take 100 Units by mouth daily.  . chlorpheniramine-HYDROcodone (TUSSIONEX PENNKINETIC ER) 10-8 MG/5ML SUER Take 5 mLs by mouth every 12 (twelve) hours as needed for cough. (Patient not taking: Reported on 06/12/2016)  . [DISCONTINUED] bisoprolol (ZEBETA) 5 MG tablet Take 5 mg by mouth daily.   No current  facility-administered medications on file prior to visit.     Review of Systems Per HPI unless specifically indicated in ROS section     Objective:    BP 112/72   Pulse 88   Temp 97.7 F (36.5 C) (Oral)   Wt 182 lb 12 oz (82.9 kg)   BMI 30.41 kg/m   Wt Readings from Last 3 Encounters:  06/12/16 182 lb 12 oz (82.9 kg)  05/23/16 177 lb 8 oz (80.5 kg)  05/23/16 177 lb 8 oz (80.5 kg)    Physical Exam  Constitutional: She appears well-developed and well-nourished. No distress.  HENT:  Head: Normocephalic and atraumatic.  Right Ear: Hearing, tympanic membrane, external ear and ear canal normal.  Left Ear: Hearing, tympanic membrane, external ear and ear canal normal.  Nose: No mucosal edema or rhinorrhea. Right sinus exhibits no maxillary sinus tenderness and no frontal sinus tenderness. Left sinus exhibits no maxillary sinus tenderness and no frontal sinus tenderness.  Mouth/Throat: Uvula is midline, oropharynx is clear and moist and mucous membranes are normal. No oropharyngeal exudate, posterior oropharyngeal edema, posterior oropharyngeal erythema or tonsillar abscesses.  Some crepitus with TMJ mobility Nasal mucosal irritation  Eyes: Conjunctivae and EOM are normal. Pupils are equal, round, and reactive to light. No scleral icterus.  Neck: Normal range of motion. Neck supple. No thyromegaly present.  No obvious LAD or parotid swelling  Cardiovascular: Normal rate, regular rhythm, normal heart sounds and intact distal pulses.   No murmur heard. Pulmonary/Chest: Effort normal and breath sounds normal. No respiratory distress. She has no wheezes. She has no rales.  Abdominal: There is generalized tenderness (mild).  Musculoskeletal: She exhibits no edema.  Lymphadenopathy:    She has no cervical adenopathy.  Skin: Skin is warm and dry. No rash noted.  Psychiatric: She has a normal mood and affect.  Nursing note and vitals reviewed.  Results for orders placed or performed in  visit on 05/23/16  Hepatitis C antibody  Result Value Ref Range   HCV Ab NEGATIVE NEGATIVE  Lipid panel  Result Value Ref Range   Cholesterol 264 (H) 0 - 200 mg/dL   Triglycerides 267.0 (H) 0.0 - 149.0 mg/dL   HDL 46.60 >39.00 mg/dL   VLDL 53.4 (H) 0.0 - 40.0 mg/dL   Total CHOL/HDL Ratio 6    NonHDL 217.27   Comprehensive metabolic panel  Result Value Ref Range   Sodium 137 135 - 145 mEq/L   Potassium 4.8 3.5 - 5.1 mEq/L   Chloride 99 96 - 112 mEq/L   CO2 27 19 - 32 mEq/L   Glucose, Bld 84 70 - 99 mg/dL   BUN 21 6 - 23 mg/dL   Creatinine, Ser 1.06 0.40 - 1.20 mg/dL   Total Bilirubin 0.3 0.2 - 1.2 mg/dL   Alkaline Phosphatase 110 39 - 117 U/L   AST 26 0 - 37 U/L   ALT 30 0 - 35 U/L   Total Protein 8.2 6.0 -  8.3 g/dL   Albumin 4.4 3.5 - 5.2 g/dL   Calcium 10.7 (H) 8.4 - 10.5 mg/dL   GFR 54.34 (L) >60.00 mL/min  TSH  Result Value Ref Range   TSH 1.24 0.35 - 4.50 uIU/mL  CBC with Differential/Platelet  Result Value Ref Range   WBC 11.4 (H) 4.0 - 10.5 K/uL   RBC 4.38 3.87 - 5.11 Mil/uL   Hemoglobin 13.1 12.0 - 15.0 g/dL   HCT 38.9 36.0 - 46.0 %   MCV 88.8 78.0 - 100.0 fl   MCHC 33.7 30.0 - 36.0 g/dL   RDW 13.1 11.5 - 15.5 %   Platelets 267.0 150.0 - 400.0 K/uL   Neutrophils Relative % 66.0 43.0 - 77.0 %   Lymphocytes Relative 25.5 12.0 - 46.0 %   Monocytes Relative 7.2 3.0 - 12.0 %   Eosinophils Relative 0.9 0.0 - 5.0 %   Basophils Relative 0.4 0.0 - 3.0 %   Neutro Abs 7.5 1.4 - 7.7 K/uL   Lymphs Abs 2.9 0.7 - 4.0 K/uL   Monocytes Absolute 0.8 0.1 - 1.0 K/uL   Eosinophils Absolute 0.1 0.0 - 0.7 K/uL   Basophils Absolute 0.0 0.0 - 0.1 K/uL  VITAMIN D 25 Hydroxy (Vit-D Deficiency, Fractures)  Result Value Ref Range   VITD 27.82 (L) 30.00 - 100.00 ng/mL  LDL cholesterol, direct  Result Value Ref Range   Direct LDL 163.0 mg/dL  labs reviewed with patient.     Assessment & Plan:   Problem List Items Addressed This Visit    Chronic daily headache   Chronic fatigue  and malaise   Chronic systolic CHF (congestive heart failure) (HCC)    Averages torsemide tablet about 4 times a week. 5lb weight gain, however seems euvolemic. Reviewed possible diet indiscretions that could account for weight gain. Will continue close monitoring.       CKD (chronic kidney disease) stage 3, GFR 30-59 ml/min    Chronic, stable. Continue to monitor.       Dyslipidemia    Deteriorated readings reviewed with patient - as well as healthy diet choices to help control cholesterol levels.  Continue zetia, add red yeast rice as well as diet changes reviewed (less added sugars, more fatty fish).       Fibromyalgia - Primary   Gastritis    Continue omeprazole 40mg  daily with 2nd PRN.       Irritable bowel syndrome    Ongoing trouble, no significant improvement with miralax/colace regimen but overall stable period - continue current regimen.  Limited in fiber/fluids due to medical conditions.      Left sided abdominal pain    Chronic, h/o diverticulitis s/p partial colectomy.  Seems to be doing well on once daily protonix with PRN 2nd dose if needed.       Osteoporosis    Reviewed with patient latest readings. Pt has restarted fosamax weekly, continues calcium/vit D plus vit D 2000 IU daily. Discussed bisphosphonate dosing.       Relevant Medications   cholecalciferol (VITAMIN D) 1000 units tablet       Follow up plan: Return in about 3 months (around 09/10/2016) for follow up visit.  [name], MD

## 2016-06-12 NOTE — Telephone Encounter (Signed)
Fax med release to [employer] for medical records

## 2016-06-12 NOTE — Patient Instructions (Addendum)
Sign release for mammogram from [facility].  Continue fosamax weekly. Continue omeprazole once daily with second if needed. Work on [employer] choices to improve cholesterol levels (LDL and triglycerides). Increase fatty fish in diet. Trial red yeast rice 600mg  once daily and if tolerated may increase to twice daily.  Make sure you're taking vitamin D 2000 units daily.  Good to see you today Return to see me in 3 months for follow up visit and labs.

## 2016-06-19 ENCOUNTER — Telehealth: Payer: Self-pay | Admitting: Family Medicine

## 2016-06-19 NOTE — Telephone Encounter (Signed)
Rec'd from [facility] forward 18 pages to [place]

## 2016-06-26 DIAGNOSIS — M65312 Trigger thumb, left thumb: Secondary | ICD-10-CM | POA: Diagnosis not present

## 2016-06-28 ENCOUNTER — Encounter: Payer: Self-pay | Admitting: *Deleted

## 2016-07-10 DIAGNOSIS — H903 Sensorineural hearing loss, bilateral: Secondary | ICD-10-CM | POA: Diagnosis not present

## 2016-07-10 DIAGNOSIS — K1123 Chronic sialoadenitis: Secondary | ICD-10-CM | POA: Diagnosis not present

## 2016-07-10 DIAGNOSIS — H9209 Otalgia, unspecified ear: Secondary | ICD-10-CM | POA: Diagnosis not present

## 2016-07-10 DIAGNOSIS — M26609 Unspecified temporomandibular joint disorder, unspecified side: Secondary | ICD-10-CM | POA: Diagnosis not present

## 2016-07-11 ENCOUNTER — Ambulatory Visit (INDEPENDENT_AMBULATORY_CARE_PROVIDER_SITE_OTHER): Payer: Medicare Other | Admitting: Cardiovascular Disease

## 2016-07-11 ENCOUNTER — Encounter: Payer: Self-pay | Admitting: Cardiovascular Disease

## 2016-07-11 VITALS — BP 100/60 | HR 76 | Ht 65.5 in | Wt 180.0 lb

## 2016-07-11 DIAGNOSIS — I447 Left bundle-branch block, unspecified: Secondary | ICD-10-CM | POA: Diagnosis not present

## 2016-07-11 DIAGNOSIS — I429 Cardiomyopathy, unspecified: Secondary | ICD-10-CM

## 2016-07-11 DIAGNOSIS — I5022 Chronic systolic (congestive) heart failure: Secondary | ICD-10-CM

## 2016-07-11 DIAGNOSIS — I951 Orthostatic hypotension: Secondary | ICD-10-CM | POA: Diagnosis not present

## 2016-07-11 DIAGNOSIS — Z95810 Presence of automatic (implantable) cardiac defibrillator: Secondary | ICD-10-CM

## 2016-07-11 DIAGNOSIS — I420 Dilated cardiomyopathy: Secondary | ICD-10-CM

## 2016-07-11 DIAGNOSIS — I428 Other cardiomyopathies: Secondary | ICD-10-CM | POA: Diagnosis not present

## 2016-07-11 DIAGNOSIS — I10 Essential (primary) hypertension: Secondary | ICD-10-CM

## 2016-07-11 DIAGNOSIS — R5382 Chronic fatigue, unspecified: Secondary | ICD-10-CM

## 2016-07-11 NOTE — Progress Notes (Signed)
Cardiology Office Note  Date:  07/11/2016   ID:  Ashley [name], DOB July 04, 1945, MRN [id number]  PCP:  [name], MD   Chief Complaint  Patient presents with  . other    6 month follow up. Pt. c/o shortness of breath, cramping in legs, LE edema and chest pain.     HPI:  71 y/o female with history of fibromyalgia, irritable bowel, pneumonia, chronic cough felt secondary to GERD, abnormal PFTs in the past, shortness of breath dating back to September 2012 that presented acutely, CT scan showing progressive pleural effusion , echo October 2012 showing Cardiomyopathy ejection fraction estimated at 25-35%, Echo September 2013 with ejection fraction 30-35%, She had ICD placed Previous Lexiscan Myoview showed no significant ischemia, no EKG changes concerning for ischemia.  EF 50 to 55% in 02/2013, 12/15, And November 2016 She received pneumonia vaccine January 2017, had severe reaction Echocardiogram February 23rd 2017 with ejection fraction 30-35% She presents for follow-up today for follow-up of her Nonischemic cardiomyopathy previous abdominal surgery May 2015  EKG reviewed by myself on today's visit NSR rate 79 with T wave abn V1 to V5, II, III and AVF Interestingly her left bundle branch block has resolved on today's visit  In follow-up today she reports that she does not feel well, feels she is in congestive heart failure, weight is up "playing with torsemide" 3 to 4 a week on average Now with SOB, legs sore, abdomen bloated Weight 180 Previously 171 in clinic  Sedentary, tired, weak Gets tired She is not going to the gym Previously participated in cardiac rehabilitation, pulmonary rehabilitation, has not maintained her exercise  She is tolerating Diovan. Did not tolerate entresto (" too expensive")  Previous Gi problems, cysts ("too much bad bacteria" in her gut) Took ABX 2 weeks Abdomen still giving her trouble but improved  Problems with arthritis,  received numerous cortisone shots in her hands  She would like echocardiogram to reevaluate her ejection fraction given she does not feel well  She likes to closely monitor her ejection fraction   Other past medical history On a previous office visit, we started her on entresto low-dose twice a day for systolic dysfunction.She took the medication until she ran out of the medication on her recent cruise 2-3 weeks agoReports that she did not feel well on the cruise, slept many hours per day, had leg discomfortShe stopped the medication then reports that she felt better when she got home. She does not want to retry the extresto. Also reports it was very expensive for her, $179 per month  Recent stress test showed perfusion abnormality to the anterior wall, significant artifact , attenuation issue secondary to left bundle branch block Cardiogram with ejection fraction 30-35%  Possible severe reaction to pneumonia shot with what sounds like viral symptoms, Requiring prednisone, antibiotics.  chronic abdominal pain, stress from her grandchildren. Previousid ultrasound showing minimal plaque on the left less than 39%, none on the right Previous dehydration with overuse of torsemide Eating more, no regular exercise, sedentary in general secondary to fibromyalgia and fatigue  Previous admission to [facility] in December 2012 for bronchitis and hypoxia as well as systolic CHF. She had antibiotics, prednisone and diuresis and has felt better since discharge. ACE inhibitor placed on hold secondary to chronic cough.  S/P ICD 10 days ago for EF <35%. Goal weight had been in the low 150 range. Previous Cardiac MRI showed ejection fraction 34 % ICD placed by Dr. Lovena Le.  Previous  symptoms of malaise and fatigue, tachycardia, weight gain, leg swelling . previously taking high doses of diuretics for weight gain causing renal dysfunction, dehydration. For creatinine 1.9 we suggested she  cut back on her diuretics. Prior weight was in the low 160 pound range  History of diverticulosis, abscess formation, perforation requiring a long course of antibiotics with PICC line, subsequent CT scan Sep 30 2013 showing free air, abscess requiring partial colectomy. She has an ostomy. Today she has lost 22 pounds by her report.   Previous echocardiogram shows improvement in her ejection fraction up to 50%. Challenging image quality. On my own review, it does appear at least 45, if not 50%.   PMH:   has a past medical history of Abdominal aortic atherosclerosis ([place]) (11/2015); Allergy; Anemia; Anxiety; Automatic implantable cardioverter-defibrillator in situ (2012); Bronchiectasis; Cancer ([place]); Chest pain; Chronic sinusitis; Chronic systolic CHF (congestive heart failure) ([place]); Depression; Diarrhea (07/14/2008); Disorder of vocal cords; Essential hypertension; Fibrocystic breast disease; Fibromyalgia; GERD (gastroesophageal reflux disease); H/O multiple allergies (10/10/2013); Hemorrhoids; Hemorrhoids; History of diverticulitis of colon; History of pneumonia (2012); Insomnia; Irritable bowel syndrome; Laryngeal nodule; Lung disease (07/14/11); Migraine without aura, without mention of intractable migraine without mention of status migrainosus; Mitral regurgitation; Nonischemic cardiomyopathy ([place]); Osteoarthritis; Osteoporosis (01/2013); Pneumonia (2011); PONV (postoperative nausea and vomiting); Pre-syncope; Pure hypercholesterolemia; sigmoid diverticulitis with perforation, abscess and fistula (09/30/2013); and Sleep apnea (2010).  PSH:    Past Surgical History:  Procedure Laterality Date  . ABDOMINAL HYSTERECTOMY  1987   w/BSO  . ANAL RECTAL MANOMETRY N/A 02/02/2016   Procedure: ANO RECTAL MANOMETRY;  Surgeon: Mauri Pole, MD;  Location: [facility];  Service: Endoscopy;  Laterality: N/A;  . APPENDECTOMY    . AUGMENTATION MAMMAPLASTY    . BREAST BIOPSY     "I've had 2-3 bx; all benign"   . breast cystectomies     due to FCBD  . BREAST MASS EXCISION  01/2010   Left-fibrocystic change w/intraductal papilloma, no malignancy  . carotid US  [id number]   [id number] LICA, normal RICA  . [employer] Bunionectomy  11/04/08   Right Great Toe (Dr. Beola Cord)  . COLONOSCOPY  1998   nml (sigmoid-Gilbert-nol)  . Colonoscopy  01/04/04   polyps, diverticulosis/severe  . COLOSTOMY N/A 10/02/2013   DESCENDING COLOSTOMY;  Surgeon: Imogene Burn. Tsuei, MD  . Derrill Memo REVERSAL N/A 02/10/2014   [name], MD  . CT chest  12/2010   possible bronchiectasis lower lobes, 02/2011 - enlarged bilateral effusions, bibasilar atx  . CT chest  04/2011   no PE.  abnormal R hilar and mediastinal adenopathy --> on rpt 05/2011, resolved  . DEXA  03/28/99   osteopenia L/S  . DEXA  03/12/03   2.2 Spine 0.1 Hip  Osteopenia  . DEXA  11/14/05   1.9 Spine 0.3 Hip  slight improvement  . DEXA  11/22/06   improved osteo and fem neck  . DEXA  01/2013   T score -2.6 at spine  . EMG/MCV  10/16/01   + mild carpal tunnel  . ESOPHAGOGASTRODUODENOSCOPY  09/2014   small HH, irregular Z line, fundic gland polyps with mild gastritis ([place])  . Flex laryngoscopy  06/11/06   ([place]) nml  . Head MRI, Head CT  10/1998   ? H/A focus ([place])  . ICD placement  07/14/11   w/pacemaker  . IMPLANTABLE CARDIOVERTER DEFIBRILLATOR IMPLANT N/A 07/14/2011   IMPLANTABLE CARDIOVERTER DEFIBRILLATOR IMPLANT;  Surgeon: Evans Lance, MD;  Location: [facility] CATH LAB;  Service: Cardiovascular;  Laterality: N/A;  . KNEE ARTHROSCOPY W/ ORIF     Left; "meniscus tear"  . KNEE SURGERY     left  . L/S films  11/21/01   nml; Right hip nml  . LASIK     left eye  . MANDIBLE SURGERY     "got about 6 pins in the bottom of my jaw"  . MANDIBLE SURGERY    . MRI Right Hip  12/12/01   Tendonitis Gluteus Medius to Gtr Trochanter Neg Bursitis (Dr. Eulas Post)  . MRI U/S  11/21/01   min. DDD L/3-4  . NCS  11/2014   L ulnar neuropathy, mild B median nerve entrapment ([place])   . OSTEOTOMY     Left foot  . PARTIAL COLECTOMY N/A 10/02/2013   PARTIAL COLECTOMY;  Surgeon: Imogene Burn. Tsuei, MD  . sinus surgery     x3 with balloon   . stress myoview  02/2011   no significant ischemia  . TOE AMPUTATION     "toe beside baby toe on left foot; got gangrene from corn"  . TONSILLECTOMY AND ADENOIDECTOMY    . US ECHOCARDIOGRAPHY  04/2011   mildly dilated LV, EF 25-30%, hypokinesis, restrictive physiology, mod MR, no AS    Current Outpatient Prescriptions  Medication Sig Dispense Refill  . alendronate (FOSAMAX) 70 MG tablet Take 70 mg by mouth once a week. Patient takes every Thursday    . AMBULATORY NON FORMULARY MEDICATION Medication Name:  1 squatty potty to use as needed 1 each 0  . Ascorbic Acid (VITAMIN C) 1000 MG tablet Take 1,000 mg by mouth daily.    Marland Kitchen aspirin EC 81 MG tablet Take 81 mg by mouth daily.    . Calcium Carbonate-Vitamin D (CALCIUM + D PO) Take 1 tablet by mouth daily.    . carvedilol (COREG) 6.25 MG tablet TAKE 1 TABLET (6.25 MG TOTAL) BY MOUTH 2 (TWO) TIMES DAILY WITH A MEAL. 180 tablet 3  . cholecalciferol (VITAMIN D) 1000 units tablet Take 2,000 Units by mouth daily.    Marland Kitchen dextromethorphan (DELSYM) 30 MG/5ML liquid Take 15 mg by mouth at bedtime as needed for cough.    . docusate sodium (COLACE) 100 MG capsule Take 100 mg by mouth daily.    Marland Kitchen ezetimibe (ZETIA) 10 MG tablet TAKE 1 TABLET (10 MG TOTAL) BY MOUTH DAILY. 30 tablet 6  . Ferrous Sulfate 143 (45 Fe) MG TBCR Take 1 tablet by mouth every other day.    Marland Kitchen omeprazole (PRILOSEC) 40 MG capsule Take 1 capsule (40 mg total) by mouth daily.    . potassium chloride (K-DUR) 10 MEQ tablet Take 10 mEq by mouth as directed. Take daily as needed    . Probiotic Product (PROBIOTIC DAILY) CAPS Take 1 capsule by mouth daily.    . Pyridoxine HCl (VITAMIN B6 PO) Take 1 capsule by mouth daily. Reported on 07/12/2015    . Red Yeast Rice 600 MG CAPS Take 1 capsule (600 mg total) by mouth 2 (two) times daily.     Marland Kitchen spironolactone (ALDACTONE) 12.5 mg TABS tablet Take 12.5 mg by mouth daily.    Marland Kitchen torsemide (DEMADEX) 10 MG tablet Take 10 mg by mouth daily as needed (swelling/fluid).    . triamcinolone (NASACORT) 55 MCG/ACT AERO nasal inhaler Place 2 sprays into the nose 2 (two) times daily.    . valsartan (DIOVAN) 80 MG tablet Take 1 tablet (80 mg total) by mouth daily. 30 tablet 11  . vitamin  E 100 UNIT capsule Take 100 Units by mouth daily.     No current facility-administered medications for this visit.      Allergies:   Antihistamines, chlorpheniramine-type; Codeine; Cyclobenzaprine; Cymbalta [duloxetine hcl]; Influenza vac split [flu virus vaccine]; Penicillins; Prevnar [pneumococcal 13-val conj vacc]; Sulfasalazine; Atorvastatin; Chlorhexidine; Dilaudid [hydromorphone hcl]; Latex; Morphine and related; Pravastatin; Prevacid [lansoprazole]; Red dye; Rosuvastatin; Simvastatin; Other; Tape; and Metaxalone   Social History:  The patient  reports that she has never smoked. She has never used smokeless tobacco. She reports that she does not drink alcohol or use drugs.   Family History:   family history includes Alcohol abuse in her paternal grandfather; Arthritis in her mother; Colon polyps in her father; Dementia in her mother; Emphysema in her mother; Heart disease in her paternal grandfather and paternal grandmother; Hypertension in her maternal grandfather; Lung cancer in her father; Osteoporosis in her mother; Stroke in her mother and paternal grandfather.    Review of Systems: Review of Systems  Constitutional: Positive for malaise/fatigue.  Respiratory: Positive for shortness of breath.   Cardiovascular: Positive for leg swelling.  Gastrointestinal: Negative.   Musculoskeletal: Negative.        Legs are sore to touch  Neurological: Positive for weakness.  Psychiatric/Behavioral: Negative.   All other systems reviewed and are negative.    PHYSICAL EXAM: VS:  BP 100/60 (BP Location: Left  Arm, Patient Position: Sitting, Cuff Size: Normal)   Pulse 76   Ht 5' 5.5" (1.664 m)   Wt 180 lb (81.6 kg)   BMI 29.50 kg/m  , BMI Body mass index is 29.5 kg/m. GEN: Well nourished, well developed, in no acute distress , obese HEENT: normal  Neck: no JVD, carotid bruits, or masses Cardiac: RRR; no murmurs, rubs, or gallops,no edema  Respiratory:  clear to auscultation bilaterally, normal work of breathing GI: soft, nontender, nondistended, + BS MS: no deformity or atrophy  Skin: warm and dry, no rash Neuro:  Strength and sensation are intact Psych: euthymic mood, full affect    Recent Labs: 05/23/2016: ALT 30; BUN 21; Creatinine, Ser 1.06; Hemoglobin 13.1; Platelets 267.0; Potassium 4.8; Sodium 137; TSH 1.24    Lipid Panel Lab Results  Component Value Date   CHOL 264 (H) 05/23/2016   HDL 46.60 05/23/2016   LDLCALC 109 (H) 05/27/2014   TRIG 267.0 (H) 05/23/2016      Wt Readings from Last 3 Encounters:  07/11/16 180 lb (81.6 kg)  06/12/16 182 lb 12 oz (82.9 kg)  05/23/16 177 lb 8 oz (80.5 kg)       ASSESSMENT AND PLAN:  Chronic systolic CHF (congestive heart failure) ([place]) - Plan: EKG 12-Lead, ECHOCARDIOGRAM COMPLETE Recommended that she take extra torsemide given her weight gain, shortness of breath symptoms. Suggested she take torsemide daily for 1 week then back to 4 days per week Previously became prerenal when taking torsemide on a daily basis  Cardiomyopathy, dilated, nonischemic ([place]) - Plan: EKG 12-Lead, ECHOCARDIOGRAM COMPLETE she is interested in her ejection fraction, reports not feeling well, likes to no her numbers. Echocardiogram ordered. Will also be nice to no her ejection fraction is giving her EKG change  Essential hypertension - Plan: EKG 12-Lead, ECHOCARDIOGRAM COMPLETE No medication changes made, continue carvedilol, Diovan  Nonischemic cardiomyopathy ([place]) - Plan: EKG 12-Lead, ECHOCARDIOGRAM COMPLETE  ICD (implantable  cardioverter-defibrillator) in place - Plan: EKG 12-Lead, ECHOCARDIOGRAM COMPLETE Followed by Dr. Lovena Le. Currently left bundle branch block has resolved  Chronic fatigue Likely secondary to sedentary  condition, Recommended regular exercise program Even discussed the benefits of pulmonary rehabilitation. She has declined at this time  LBBB (left bundle branch block) Prior EKG showing left bundle branch block, today this has resolved   Total encounter time more than 25 minutes  Greater than 50% was spent in counseling and coordination of care with the patient   Disposition:   F/U  6 months   Orders Placed This Encounter  Procedures  . EKG 12-Lead  . ECHOCARDIOGRAM COMPLETE     Signed, Esmond Plants, M.D., Ph.D. 07/11/2016  [place] , [place]

## 2016-07-11 NOTE — Patient Instructions (Signed)
Medication Instructions:   No medication changes made  Take extra torsemide for shortness of breath  Labwork:  No new labs needed  Testing/Procedures:  No further testing at this time   I recommend watching educational videos on topics of interest to you at:       [email or web address].goemmi.com  Enter code: HEARTCARE    Follow-Up: It was a pleasure seeing you in the office today. Please call us if you have new issues that need to be addressed before your next appt.  [phone]  Your physician wants you to follow-up in: 6 months.  You will receive a reminder letter in the mail two months in advance. If you don't receive a letter, please call our office to schedule the follow-up appointment.  If you need a refill on your cardiac medications before your next appointment, please call your pharmacy.

## 2016-07-12 ENCOUNTER — Other Ambulatory Visit: Payer: Self-pay | Admitting: Cardiovascular Disease

## 2016-07-12 ENCOUNTER — Ambulatory Visit: Payer: Medicare Other | Admitting: Cardiovascular Disease

## 2016-07-12 DIAGNOSIS — I420 Dilated cardiomyopathy: Secondary | ICD-10-CM

## 2016-07-12 DIAGNOSIS — I5022 Chronic systolic (congestive) heart failure: Secondary | ICD-10-CM

## 2016-07-17 ENCOUNTER — Telehealth: Payer: Self-pay | Admitting: Cardiovascular Disease

## 2016-07-17 NOTE — Telephone Encounter (Signed)
LM on personal VM to send manual transmission to review device function. Call back to device clinic with additional concerns.

## 2016-07-17 NOTE — Telephone Encounter (Signed)
New Message   1. Has your device fired? No  2. Is you device beeping? No  3. Are you experiencing draining or swelling at device site? No  4. Are you calling to see if we received your device transmission? No  5. Have you passed out? No  Pt voiced she fell yesterday on her left side and she stated where the device is it feels bruised from her falling and wondering if it shocked due to her falling.

## 2016-07-20 NOTE — Telephone Encounter (Signed)
Called pt back and informed her that her transmission was normal. Pt appreciative of call back.

## 2016-07-20 NOTE — Telephone Encounter (Signed)
Spoke with pt regarding remote transmission, pt stated that area around the defibrillator was better, informed her that  She could still send a transmission in and it would be reviewed, pt stated that she would send one in now.

## 2016-07-25 ENCOUNTER — Encounter: Payer: Self-pay | Admitting: Internal Medicine

## 2016-07-25 ENCOUNTER — Ambulatory Visit (INDEPENDENT_AMBULATORY_CARE_PROVIDER_SITE_OTHER): Payer: Medicare Other | Admitting: Internal Medicine

## 2016-07-25 VITALS — BP 110/62 | HR 94 | Temp 98.1°F | Wt 182.5 lb

## 2016-07-25 DIAGNOSIS — J069 Acute upper respiratory infection, unspecified: Secondary | ICD-10-CM | POA: Diagnosis not present

## 2016-07-25 MED ORDER — ALBUTEROL SULFATE HFA 108 (90 BASE) MCG/ACT IN AERS: 2.0000 | INHALATION_SPRAY | Freq: Four times a day (QID) | RESPIRATORY_TRACT | 0 refills | Status: DC | PRN

## 2016-07-25 MED ORDER — DOXYCYCLINE HYCLATE 100 MG PO TABS: 100.0000 mg | ORAL_TABLET | Freq: Two times a day (BID) | ORAL | 0 refills | Status: DC

## 2016-07-25 MED ORDER — AZITHROMYCIN 250 MG PO TABS: ORAL_TABLET | ORAL | 0 refills | Status: DC

## 2016-07-25 NOTE — Progress Notes (Addendum)
HPI  Pt presents to the clinic today with c/o headache, body aches, cough and wheezing. She reports this started 5 days ago. The cough is productive of green mucous. She denies runny nose, nasal congestion, ear pain, sore throat or shortness of breath. She denies fever or chills. She has tried Nasonex without any relief. She has a history of allergies, bronchiectasis, chronic sinusitis. She has not had sick contacts that she is aware of. Her pneumonia vaccines are UTD.  Review of Systems        Past Medical History:  Diagnosis Date  . Abdominal aortic atherosclerosis ([place]) 11/2015   by CT  . Allergy   . Anemia   . Anxiety   . Automatic implantable cardioverter-defibrillator in situ 2012   a. MDT single lead ICD.  Marland Kitchen Bronchiectasis    a. possibly mild, treat URIs aggressively  . Cancer ([facility])    basal cell, arms & neck  . Chest pain    a. prev neg MV.  . Chronic sinusitis   . Chronic systolic CHF (congestive heart failure) ([facility])    a. EF 25-35%, LV dilation, significant MR, goal weight 150lbs;  b. 04/2014 Echo: EF 50-55%, Gr 1 DD.  Marland Kitchen Depression   . Diarrhea 07/14/2008   Qualifier: Diagnosis of  By: Glori Bickers MD, Carmell Austria   . Disorder of vocal cords   . Essential hypertension   . Fibrocystic breast disease   . Fibromyalgia   . GERD (gastroesophageal reflux disease)   . H/O multiple allergies 10/10/2013  . Hemorrhoids   . Hemorrhoids   . History of diverticulitis of colon   . History of pneumonia 2012  . Insomnia   . Irritable bowel syndrome   . Laryngeal nodule    Dr. Thomasena Edis  . Lung disease 07/14/11  . Migraine without aura, without mention of intractable migraine without mention of status migrainosus   . Mitral regurgitation   . Nonischemic cardiomyopathy ([place])    a. Recovered LV fxn - prev as low as 25%, now 50-55% by echo 04/2014;  b. 2012 s/p MDT [id number] [place] VR single lead ICD.  Marland Kitchen Osteoarthritis    knees, back, hands, low back,   . Osteoporosis 01/2013   T-2.7 spine  (01/2016)  . Pneumonia 2011  . PONV (postoperative nausea and vomiting)   . Pre-syncope   . Pure hypercholesterolemia   . sigmoid diverticulitis with perforation, abscess and fistula 09/30/2013  . Sleep apnea 2010   borderline, inconclusive- /w [place]     Family History  Problem Relation Age of Onset  . Lung cancer Father     + smoker  . Colon polyps Father   . Dementia Mother   . Osteoporosis Mother     Lumbar spine  . Stroke Mother     x 5 @ 27 YOA  . Arthritis Mother     hands  . Emphysema Mother   . Alcohol abuse Paternal Grandfather   . Stroke Paternal Grandfather     ?  Marland Kitchen Heart disease Paternal Grandfather   . Hypertension Maternal Grandfather     ?  Marland Kitchen Heart disease Paternal Grandmother   . Colon cancer Neg Hx   . Esophageal cancer Neg Hx   . Gallbladder disease Neg Hx     Social History   Social History  . Marital status: Married    Spouse name: N/A  . Number of children: 1  . Years of education: N/A   Occupational History  . 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 [employer]    Social History Main Topics  . Smoking status: Never Smoker  . Smokeless tobacco: Never Used     Comment: Lived with smokers x 23 yrs, smoked herself "for a week"  . Alcohol use No  . Drug use: No  . Sexual activity: Not Currently   Other Topics Concern  . Not on file   Social History Narrative   1 adopted child      Lives with husband             Allergies  Allergen Reactions  . Antihistamines, Chlorpheniramine-Type Other (See Comments)    Makes her eyes look like she sees strobe lights  . Codeine Other (See Comments)    Confusion and dizziness  . Cyclobenzaprine Other (See Comments)    Adverse reaction - back and throat pain, dizziness, exhaustion  . Cymbalta [Duloxetine Hcl] Other (See Comments)    "body spasms and made me feel weird in my head"   . Influenza Vac Split [Flu Virus Vaccine] Other (See Comments)    "allergic to  concentrated eggs that are in vaccine"  . Penicillins Other (See Comments)    REACTION: "told 50 years ago I couldn't take it; might be immune to it", able to take amoxicillin  . Prevnar [Pneumococcal 13-Val Conj Vacc] Other (See Comments)    Egg allergy  . Sulfasalazine Other (See Comments)    "makes my whole body smell like sulfa & makes me sick just smelling it"  . Atorvastatin Other (See Comments)    Severe muscle cramps to generic atorvastatin.  Able to take brand lipitor.  . Chlorhexidine Other (See Comments)    blisters  . Dilaudid [Hydromorphone Hcl] Other (See Comments)    Feels like she is burning internally   . Latex Nausea Only, Rash and Other (See Comments)    Pain and infection  . Morphine And Related Other (See Comments)    Internally feels like she is on fire   . Pravastatin Other (See Comments)    myalgias  . Prevacid [Lansoprazole] Other (See Comments)    Worsened GI side effects  . Red Dye Nausea Only and Swelling  . Rosuvastatin Other (See Comments)    Was not effective controlling lipids  . Simvastatin Other (See Comments)    Leg cramps  . Other     NOTE: pt is able to take cephalosporins without reaction  . Tape Other (See Comments)    All adhesives  - Blisters, itching and burning   . Metaxalone Other (See Comments)    REACTION: unknown     Constitutional: Positive headache, fatigue. Denies fever or abrupt weight changes.  HEENT:  Denies eye redness, eye pain, pressure behind the eyes, facial pain, nasal congestion, ear pain, ringing in the ears, wax buildup, runny nose or sore throat. Respiratory: Positive cough. Denies difficulty breathing or shortness of breath.  Cardiovascular: Denies chest pain, chest tightness, palpitations or swelling in the hands or feet.   No other specific complaints in a complete review of systems (except as listed in HPI above).  Objective:   BP 110/62   Pulse 94   Temp 98.1 F (36.7 C) (Oral)   Wt 182 lb 8 oz (82.8  kg)   SpO2 96%   BMI 29.91 kg/m   Wt Readings from Last 3 Encounters:  07/25/16 182 lb 8 oz (82.8 kg)  07/11/16 180 lb (81.6 kg)  06/12/16 182 lb 12  oz (82.9 kg)     General: Appears her stated age, in NAD. HEENT: Head: normal shape and size, no sinus tenderness noted; Ears: Tm's gray and intact, normal light reflex; Nose: mucosa pink and moist, septum midline; Throat/Mouth: + PND. Teeth present, mucosa erythematous and moist, no exudate noted, no lesions or ulcerations noted.  Neck: No cervical lymphadenopathy.  Cardiovascular: Normal rate and rhythm.  Pulmonary/Chest: Normal effort and positive vesicular breath sounds. No respiratory distress. No wheezes, rales or ronchi noted.       Assessment & Plan:   Upper Respiratory Infection:  Get some rest and drink plenty of water eRx for Doxycycline 100 mg BID x 10 days Refilled Albuterol inhaler Delsym as needed for cough Pt insisting on ECG today- no acute findings, unchanged from ECG 2 weeks ago  RTC as needed or if symptoms persist.   Webb Silversmith, NP

## 2016-07-25 NOTE — Addendum Note (Signed)
Addended by: Jearld Fenton on: 07/25/2016 03:26 PM   Modules accepted: Orders

## 2016-07-25 NOTE — Patient Instructions (Signed)
Cough, Adult  A cough helps to clear your throat and lungs. A cough may last only 2?3 weeks (acute), or it may last longer than 8 weeks (chronic). Many different things can cause a cough. A cough may be a sign of an illness or another medical condition.  Follow these instructions at home:  ? Pay attention to any changes in your cough.  ? Take medicines only as told by your doctor.  ? If you were prescribed an antibiotic medicine, take it as told by your doctor. Do not stop taking it even if you start to feel better.  ? Talk with your doctor before you try using a cough medicine.  ? Drink enough fluid to keep your pee (urine) clear or pale yellow.  ? If the air is dry, use a cold steam vaporizer or humidifier in your home.  ? Stay away from things that make you cough at work or at home.  ? If your cough is worse at night, try using extra pillows to raise your head up higher while you sleep.  ? Do not smoke, and try not to be around smoke. If you need help quitting, ask your doctor.  ? Do not have caffeine.  ? Do not drink alcohol.  ? Rest as needed.  Contact a doctor if:  ? You have new problems (symptoms).  ? You cough up yellow fluid (pus).  ? Your cough does not get better after 2?3 weeks, or your cough gets worse.  ? Medicine does not help your cough and you are not sleeping well.  ? You have pain that gets worse or pain that is not helped with medicine.  ? You have a fever.  ? You are losing weight and you do not know why.  ? You have night sweats.  Get help right away if:  ? You cough up blood.  ? You have trouble breathing.  ? Your heartbeat is very fast.  This information is not intended to replace advice given to you by your health care provider. Make sure you discuss any questions you have with your health care provider.  Document Released: 01/12/2011 Document Revised: 10/07/2015 Document Reviewed: 07/08/2014  Elsevier Interactive Patient Education ? 2017 [employer]

## 2016-07-28 ENCOUNTER — Telehealth: Payer: Self-pay

## 2016-07-28 NOTE — Telephone Encounter (Signed)
Pt was seen 07-25-16 for bronchitis by Webb Silversmith. She has been taking hydrocodone cough syrup and delsym along with the antibiotic and inhaler. She is having leg cramps. Not sure if it is all related. She says she is not seeing any improvement with the cough. Wonders what else she should do? Will forward to Allie Bossier in Regina's absence.

## 2016-07-28 NOTE — Telephone Encounter (Signed)
We are sorry she's not noticed improvement with the cough. Is she feeling short of breath? Any wheezing? Is she feeling any better?

## 2016-07-28 NOTE — Telephone Encounter (Signed)
Noted  

## 2016-07-28 NOTE — Telephone Encounter (Signed)
Spoke to pt and she stated she is taking all meds as instructed Delsym, inhaler and Ax... Pt denies increase in SOB or wheezing just coughing a lot---pt reports the inhaler helps calm the cough down... Also experiencing cramps in legs.... Per verbal ok from Valier instruct pt to continue to drink plenty of fluid and try to walk for muscle movement and call back next week to possible check BMP for electrolytes... Pt expressed understanding

## 2016-07-31 ENCOUNTER — Encounter: Payer: Self-pay | Admitting: Internal Medicine

## 2016-07-31 ENCOUNTER — Ambulatory Visit (INDEPENDENT_AMBULATORY_CARE_PROVIDER_SITE_OTHER): Payer: Medicare Other | Admitting: Internal Medicine

## 2016-07-31 VITALS — BP 108/70 | HR 76 | Temp 98.1°F | Wt 178.8 lb

## 2016-07-31 DIAGNOSIS — R350 Frequency of micturition: Secondary | ICD-10-CM

## 2016-07-31 DIAGNOSIS — M79609 Pain in unspecified limb: Secondary | ICD-10-CM

## 2016-07-31 DIAGNOSIS — R7309 Other abnormal glucose: Secondary | ICD-10-CM | POA: Diagnosis not present

## 2016-07-31 DIAGNOSIS — R202 Paresthesia of skin: Secondary | ICD-10-CM | POA: Diagnosis not present

## 2016-07-31 DIAGNOSIS — R631 Polydipsia: Secondary | ICD-10-CM

## 2016-07-31 DIAGNOSIS — R42 Dizziness and giddiness: Secondary | ICD-10-CM

## 2016-07-31 DIAGNOSIS — R112 Nausea with vomiting, unspecified: Secondary | ICD-10-CM | POA: Diagnosis not present

## 2016-07-31 DIAGNOSIS — R195 Other fecal abnormalities: Secondary | ICD-10-CM | POA: Diagnosis not present

## 2016-07-31 LAB — COMPREHENSIVE METABOLIC PANEL
ALT: 37 U/L — ABNORMAL HIGH (ref 0–35)
AST: 25 U/L (ref 0–37)
Albumin: 4.2 g/dL (ref 3.5–5.2)
Alkaline Phosphatase: 113 U/L (ref 39–117)
BUN: 31 mg/dL — ABNORMAL HIGH (ref 6–23)
CO2: 27 mEq/L (ref 19–32)
Calcium: 10.1 mg/dL (ref 8.4–10.5)
Chloride: 101 mEq/L (ref 96–112)
Creatinine, Ser: 1.31 mg/dL — ABNORMAL HIGH (ref 0.40–1.20)
GFR: 42.53 mL/min — ABNORMAL LOW (ref >60.00)
Glucose, Bld: 115 mg/dL — ABNORMAL HIGH (ref 70–99)
Potassium: 5 mEq/L (ref 3.5–5.1)
Sodium: 139 mEq/L (ref 135–145)
Total Bilirubin: 0.3 mg/dL (ref 0.2–1.2)
Total Protein: 7.8 g/dL (ref 6.0–8.3)

## 2016-07-31 LAB — CBC
HCT: 37.5 % (ref 36.0–46.0)
Hemoglobin: 12.4 g/dL (ref 12.0–15.0)
MCHC: 33.1 g/dL (ref 30.0–36.0)
MCV: 90 fl (ref 78.0–100.0)
Platelets: 212 10*3/uL (ref 150.0–400.0)
RBC: 4.17 Mil/uL (ref 3.87–5.11)
RDW: 12.8 % (ref 11.5–15.5)
WBC: 13.2 10*3/uL — ABNORMAL HIGH (ref 4.0–10.5)

## 2016-07-31 LAB — HEMOGLOBIN A1C: Hgb A1c MFr Bld: 6.1 % (ref 4.6–6.5)

## 2016-07-31 NOTE — Patient Instructions (Signed)
Paresthesia  Paresthesia is a burning or prickling feeling. This feeling can happen in any part of the body. It often happens in the hands, arms, legs, or feet. Usually, it is not painful. In most cases, the feeling goes away in a short time and is not a sign of a serious problem.  Follow these instructions at home:  · Avoid drinking alcohol.  · Try massage or needle therapy (acupuncture) to help with your problems.  · Keep all follow-up visits as told by your doctor. This is important.  Contact a doctor if:  · You keep on having episodes of paresthesia.  · Your burning or prickling feeling gets worse when you walk.  · You have pain or cramps.  · You feel dizzy.  · You have a rash.  Get help right away if:  · You feel weak.  · You have trouble walking or moving.  · You have problems speaking, understanding, or seeing.  · You feel confused.  · You cannot control when you pee (urinate) or poop (bowel movement).  · You lose feeling (numbness) after an injury.  · You pass out (faint).  This information is not intended to replace advice given to you by your health care provider. Make sure you discuss any questions you have with your health care provider.  Document Released: 04/13/2008 Document Revised: 10/07/2015 Document Reviewed: 04/27/2014  Elsevier Interactive Patient Education © 2017 Elsevier Inc.

## 2016-07-31 NOTE — Progress Notes (Signed)
Subjective:    Patient ID: Ashley [name], female    DOB: October 31, 1945, 71 y.o.   MRN: [id number]  HPI  Pt presents to the clinic today with c/o an episode of loose stool and vomiting. She reports this occurred on Friday. Over the weekend, she reports she had chills. She ran a fever up to 101.0. She vomited again this morning. She denies abdominal pain, urinary symptoms or blood in her stool. She has been having some associated leg cramps, which she thinks is due to dehydration. She took extra potassium and increased her fluids, which seemed to help. She has a history of IBS.  She also wants to see if she has diabetes. She reports she has noted increased thirst, urinary frequency and pain and numbness in hands and feet.  Review of Systems  Past Medical History:  Diagnosis Date  . Abdominal aortic atherosclerosis ([place]) 11/2015   by CT  . Allergy   . Anemia   . Anxiety   . Automatic implantable cardioverter-defibrillator in situ 2012   a. MDT single lead ICD.  Marland Kitchen Bronchiectasis    a. possibly mild, treat URIs aggressively  . Cancer ([facility])    basal cell, arms & neck  . Chest pain    a. prev neg MV.  . Chronic sinusitis   . Chronic systolic CHF (congestive heart failure) (H[facility])    a. EF 25-35%, LV dilation, significant MR, goal weight 150lbs;  b. 04/2014 Echo: EF 50-55%, Gr 1 DD.  Marland Kitchen Depression   . Diarrhea 07/14/2008   Qualifier: Diagnosis of  By: Glori Bickers MD, Carmell Austria   . Disorder of vocal cords   . Essential hypertension   . Fibrocystic breast disease   . Fibromyalgia   . GERD (gastroesophageal reflux disease)   . H/O multiple allergies 10/10/2013  . Hemorrhoids   . Hemorrhoids   . History of diverticulitis of colon   . History of pneumonia 2012  . Insomnia   . Irritable bowel syndrome   . Laryngeal nodule    Dr. Thomasena Edis  . Lung disease 07/14/11  . Migraine without aura, without mention of intractable migraine without mention of status migrainosus   . Mitral regurgitation   .  Nonischemic cardiomyopathy ([place])    a. Recovered LV fxn - prev as low as 25%, now 50-55% by echo 04/2014;  b. 2012 s/p MDT [id number] [place] VR single lead ICD.  Marland Kitchen Osteoarthritis    knees, back, hands, low back,   . Osteoporosis 01/2013   T-2.7 spine (01/2016)  . Pneumonia 2011  . PONV (postoperative nausea and vomiting)   . Pre-syncope   . Pure hypercholesterolemia   . sigmoid diverticulitis with perforation, abscess and fistula 09/30/2013  . Sleep apnea 2010   borderline, inconclusive- /w [facility]     Current Outpatient Prescriptions  Medication Sig Dispense Refill  . albuterol (PROVENTIL HFA;VENTOLIN HFA) 108 (90 Base) MCG/ACT inhaler Inhale 2 puffs into the lungs every 6 (six) hours as needed for wheezing or shortness of breath. 1 Inhaler 0  . alendronate (FOSAMAX) 70 MG tablet Take 70 mg by mouth once a week. Patient takes every Thursday    . AMBULATORY NON FORMULARY MEDICATION Medication Name:  1 squatty potty to use as needed 1 each 0  . Ascorbic Acid (VITAMIN C) 1000 MG tablet Take 1,000 mg by mouth daily.    Marland Kitchen aspirin EC 81 MG tablet Take 81 mg by mouth daily.    . Calcium Carbonate-Vitamin D (CALCIUM +  D PO) Take 1 tablet by mouth daily.    . carvedilol (COREG) 6.25 MG tablet TAKE 1 TABLET (6.25 MG TOTAL) BY MOUTH 2 (TWO) TIMES DAILY WITH A MEAL. 180 tablet 3  . cholecalciferol (VITAMIN D) 1000 units tablet Take 2,000 Units by mouth daily.    Marland Kitchen dextromethorphan (DELSYM) 30 MG/5ML liquid Take 15 mg by mouth at bedtime as needed for cough.    . docusate sodium (COLACE) 100 MG capsule Take 100 mg by mouth daily.    Marland Kitchen doxycycline (VIBRA-TABS) 100 MG tablet Take 1 tablet (100 mg total) by mouth 2 (two) times daily. 20 tablet 0  . ezetimibe (ZETIA) 10 MG tablet TAKE 1 TABLET (10 MG TOTAL) BY MOUTH DAILY. 30 tablet 6  . Ferrous Sulfate 143 (45 Fe) MG TBCR Take 1 tablet by mouth every other day.    Marland Kitchen omeprazole (PRILOSEC) 40 MG capsule Take 1 capsule (40 mg total) by mouth daily.    .  potassium chloride (K-DUR) 10 MEQ tablet Take 10 mEq by mouth as directed. Take daily as needed    . Probiotic Product (PROBIOTIC DAILY) CAPS Take 1 capsule by mouth daily.    . Pyridoxine HCl (VITAMIN B6 PO) Take 1 capsule by mouth daily. Reported on 07/12/2015    . Red Yeast Rice 600 MG CAPS Take 1 capsule (600 mg total) by mouth 2 (two) times daily.    Marland Kitchen spironolactone (ALDACTONE) 12.5 mg TABS tablet Take 12.5 mg by mouth daily.    Marland Kitchen torsemide (DEMADEX) 10 MG tablet Take 10 mg by mouth daily as needed (swelling/fluid).    . triamcinolone (NASACORT) 55 MCG/ACT AERO nasal inhaler Place 2 sprays into the nose 2 (two) times daily.    . valsartan (DIOVAN) 80 MG tablet Take 1 tablet (80 mg total) by mouth daily. 30 tablet 11  . vitamin E 100 UNIT capsule Take 100 Units by mouth daily.     No current facility-administered medications for this visit.     Allergies  Allergen Reactions  . Antihistamines, Chlorpheniramine-Type Other (See Comments)    Makes her eyes look like she sees strobe lights  . Codeine Other (See Comments)    Confusion and dizziness  . Cyclobenzaprine Other (See Comments)    Adverse reaction - back and throat pain, dizziness, exhaustion  . Cymbalta [Duloxetine Hcl] Other (See Comments)    "body spasms and made me feel weird in my head"   . Influenza Vac Split [Flu Virus Vaccine] Other (See Comments)    "allergic to concentrated eggs that are in vaccine"  . Penicillins Other (See Comments)    REACTION: "told 50 years ago I couldn't take it; might be immune to it", able to take amoxicillin  . Prevnar [Pneumococcal 13-Val Conj Vacc] Other (See Comments)    Egg allergy  . Sulfasalazine Other (See Comments)    "makes my whole body smell like sulfa & makes me sick just smelling it"  . Atorvastatin Other (See Comments)    Severe muscle cramps to generic atorvastatin.  Able to take brand lipitor.  . Chlorhexidine Other (See Comments)    blisters  . Dilaudid [Hydromorphone  Hcl] Other (See Comments)    Feels like she is burning internally   . Latex Nausea Only, Rash and Other (See Comments)    Pain and infection  . Morphine And Related Other (See Comments)    Internally feels like she is on fire   . Pravastatin Other (See Comments)  myalgias  . Prevacid [Lansoprazole] Other (See Comments)    Worsened GI side effects  . Red Dye Nausea Only and Swelling  . Rosuvastatin Other (See Comments)    Was not effective controlling lipids  . Simvastatin Other (See Comments)    Leg cramps  . Other     NOTE: pt is able to take cephalosporins without reaction  . Tape Other (See Comments)    All adhesives  - Blisters, itching and burning   . Metaxalone Other (See Comments)    REACTION: unknown    Family History  Problem Relation Age of Onset  . Lung cancer Father     + smoker  . Colon polyps Father   . Dementia Mother   . Osteoporosis Mother     Lumbar spine  . Stroke Mother     x 5 @ 58 YOA  . Arthritis Mother     hands  . Emphysema Mother   . Alcohol abuse Paternal Grandfather   . Stroke Paternal Grandfather     ?  Marland Kitchen Heart disease Paternal Grandfather   . Hypertension Maternal Grandfather     ?  Marland Kitchen Heart disease Paternal Grandmother   . Colon cancer Neg Hx   . Esophageal cancer Neg Hx   . Gallbladder disease Neg Hx     Social History   Social History  . Marital status: Married    Spouse name: N/A  . Number of children: 1  . Years of education: N/A   Occupational History  . 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 [employer]    Social History Main Topics  . Smoking status: Never Smoker  . Smokeless tobacco: Never Used     Comment: Lived with smokers x 23 yrs, smoked herself "for a week"  . Alcohol use No  . Drug use: No  . Sexual activity: Not Currently   Other Topics Concern  . Not on file   Social History Narrative   1 adopted child      Lives with husband              Constitutional: Pt reports  fever. Denies malaise, fatigue, headache or abrupt weight changes.  HEENT: Denies eye pain, eye redness, ear pain, ringing in the ears, wax buildup, runny nose, nasal congestion, bloody nose, or sore throat. Respiratory: Denies difficulty breathing, shortness of breath, cough or sputum production.   Cardiovascular: Denies chest pain, chest tightness, palpitations or swelling in the hands or feet.  Gastrointestinal: Pt reports vomiting and loose stool. Denies abdominal pain, bloating, constipation, diarrhea or blood in the stool.  GU: Denies urgency, frequency, pain with urination, burning sensation, blood in urine, odor or discharge. Neurological: Pt reports lightheadedness. Denies dizziness, difficulty with memory, difficulty with speech or problems with balance and coordination.    No other specific complaints in a complete review of systems (except as listed in HPI above).     Objective:   Physical Exam   BP 108/70   Pulse 76   Temp 98.1 F (36.7 C) (Oral)   Wt 178 lb 12 oz (81.1 kg)   SpO2 97%   BMI 29.29 kg/m  Wt Readings from Last 3 Encounters:  07/31/16 178 lb 12 oz (81.1 kg)  07/25/16 182 lb 8 oz (82.8 kg)  07/11/16 180 lb (81.6 kg)    General: Appears her stated age, in NAD. Skin: Warm, dry and intact. Neck:  No adenopathy noted.  Cardiovascular: Normal rate and rhythm.  Pulmonary/Chest: Normal effort and positive vesicular breath sounds. No respiratory distress. No wheezes, rales or ronchi noted.  Abdomen: Soft and tender in the epigastric region. Hyperactive bowel sounds. No distention or masses noted.  Neurological: Alert and oriented.     BMET    Component Value Date/Time   NA 137 05/23/2016 1349   NA 139 08/13/2015 0959   K 4.8 05/23/2016 1349   K 3.6 09/09/2012 0735   CL 99 05/23/2016 1349   CO2 27 05/23/2016 1349   GLUCOSE 84 05/23/2016 1349   BUN 21 05/23/2016 1349   BUN 40 (H) 08/13/2015 0959   CREATININE 1.06 05/23/2016 1349   CREATININE 2.16 (H)  04/01/2012 1740   CALCIUM 10.7 (H) 05/23/2016 1349   GFRNONAA 33 (L) 08/13/2015 0959   GFRAA 38 (L) 08/13/2015 0959    Lipid Panel     Component Value Date/Time   CHOL 264 (H) 05/23/2016 1349   TRIG 267.0 (H) 05/23/2016 1349   HDL 46.60 05/23/2016 1349   CHOLHDL 6 05/23/2016 1349   VLDL 53.4 (H) 05/23/2016 1349   LDLCALC 109 (H) 05/27/2014 0900    CBC    Component Value Date/Time   WBC 11.4 (H) 05/23/2016 1349   RBC 4.38 05/23/2016 1349   HGB 13.1 05/23/2016 1349   HCT 38.9 05/23/2016 1349   PLT 267.0 05/23/2016 1349   MCV 88.8 05/23/2016 1349   MCH 29.4 12/25/2014 1849   MCHC 33.7 05/23/2016 1349   RDW 13.1 05/23/2016 1349   LYMPHSABS 2.9 05/23/2016 1349   MONOABS 0.8 05/23/2016 1349   EOSABS 0.1 05/23/2016 1349   BASOSABS 0.0 05/23/2016 1349    Hgb A1C Lab Results  Component Value Date   HGBA1C 5.8 06/01/2015           Assessment & Plan:   Vomiting and diarrhea:  This has only occurred a few times She denies being nauseated and has been able to eat lunch She declines RX for Zofran Encouraged clear liquids for 24 hours, advance diet as tolerated  Lightheadedness, increased thirst, urinary frequency, pain and paresthesia of hands and feet:  A1C today  Will follow up after labs, return precautions discussed Webb Silversmith, NP

## 2016-08-01 ENCOUNTER — Other Ambulatory Visit: Payer: Self-pay | Admitting: Internal Medicine

## 2016-08-07 ENCOUNTER — Other Ambulatory Visit: Payer: Self-pay

## 2016-08-07 ENCOUNTER — Encounter: Payer: Self-pay | Admitting: Family Medicine

## 2016-08-07 ENCOUNTER — Ambulatory Visit (INDEPENDENT_AMBULATORY_CARE_PROVIDER_SITE_OTHER): Payer: Medicare Other | Admitting: Family Medicine

## 2016-08-07 ENCOUNTER — Ambulatory Visit (INDEPENDENT_AMBULATORY_CARE_PROVIDER_SITE_OTHER): Payer: Medicare Other

## 2016-08-07 VITALS — BP 100/60 | HR 73 | Temp 97.6°F | Ht 65.5 in | Wt 180.2 lb

## 2016-08-07 DIAGNOSIS — I420 Dilated cardiomyopathy: Secondary | ICD-10-CM

## 2016-08-07 DIAGNOSIS — I429 Cardiomyopathy, unspecified: Secondary | ICD-10-CM

## 2016-08-07 DIAGNOSIS — J189 Pneumonia, unspecified organism: Secondary | ICD-10-CM

## 2016-08-07 DIAGNOSIS — I5022 Chronic systolic (congestive) heart failure: Secondary | ICD-10-CM

## 2016-08-07 DIAGNOSIS — Z889 Allergy status to unspecified drugs, medicaments and biological substances status: Secondary | ICD-10-CM | POA: Diagnosis not present

## 2016-08-07 LAB — ECHOCARDIOGRAM COMPLETE
Height: 65.5 in
Weight: 2884 oz

## 2016-08-07 MED ORDER — LEVOFLOXACIN 500 MG PO TABS: 500.0000 mg | ORAL_TABLET | Freq: Every day | ORAL | 0 refills | Status: DC

## 2016-08-07 NOTE — Progress Notes (Signed)
Dr. Karleen Hampshire T. Carlon Chaloux, MD, CAQ [facility] [address] [place], [address] Phone: [phone] Fax: [phone]  08/07/2016  Patient: Ashley [name], MRN: [id number], DOB: 09-28-1945, 71 y.o.  Primary Physician:  [name], MD   Chief Complaint  Patient presents with  . Cough    Bronchitis  . Fatigue   Subjective:   Ashley [name] is a 71 y.o. very pleasant female patient who presents with the following:  f/u recent illness s/p 2 recent office visits.   Had some wheezing in her larynx - feels like not moving. Feels poorly in her throat and upper lungs and having a deep throat. Had some diarrhea. Over the weekend.   s/p partial colectomy.   Spitting up a lot of phlegm. Had been green to yellow - had some dark in it. Often not sleeping well.   Past Medical History, Surgical History, Social History, Family History, Problem List, Medications, and Allergies have been reviewed and updated if relevant.  Patient Active Problem List   Diagnosis Date Noted  . Chronic fatigue 07/11/2016  . LBBB (left bundle branch block) 07/11/2016  . Gastritis 06/12/2016  . Bladder wall thickening 02/16/2016  . Fecal incontinence   . Left sided abdominal pain 11/22/2015  . History of diverticular abscess 11/22/2015  . Abdominal aortic atherosclerosis (HCC) 11/13/2015  . Acute sinusitis 10/13/2015  . Acute renal failure (HCC) 09/27/2015  . 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  . Hoarseness 06/21/2015  . Hypersensitivity to pneumococcal vaccine 06/10/2015  . Medicare annual wellness visit, subsequent 06/08/2015  . Advanced care planning/counseling discussion 06/08/2015  . Intertrigo 04/06/2015  . Chronic daily headache 04/06/2015  . Neuropathy (HCC) 04/06/2015  . Paresthesia of bilateral legs 04/06/2015  . Essential hypertension   . Nonischemic cardiomyopathy (HCC)   . Chest pain and shortness of breath 12/25/2014  . S/P colostomy  takedown 02/10/2014  . ICD (implantable cardioverter-defibrillator) in place 10/23/2013  . H/O multiple allergies 10/10/2013  . Sigmoid diverticulitis with abscess, perforation and fistula formation 09/25/2013  . Osteoporosis 01/13/2013  . Irregular heart beat 08/06/2012  . Depression 07/09/2012  . Chronic recurrent sinusitis 06/17/2012  . Vitamin D deficiency 03/05/2012  . CKD (chronic kidney disease) stage 3, GFR 30-59 ml/min 11/09/2011  . Epistaxis, recurrent 10/11/2011  . Orthostatic hypotension 04/30/2011  . Cardiomyopathy, dilated, nonischemic (HCC) 03/01/2011  . Mitral regurgitation 02/24/2011  . Chronic systolic CHF (congestive heart failure) (HCC) 02/24/2011  . Cough 01/20/2011  . Chronic fatigue and malaise 11/02/2010  . Anxiety state 08/18/2010  . 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  . Dyslipidemia 11/22/2006  . COMMON MIGRAINE 11/22/2006  . DIVERTICULOSIS, COLON 11/22/2006  . Irritable bowel syndrome 11/22/2006  . FIBROCYSTIC BREAST DISEASE 11/22/2006  . Osteoarthritis 11/22/2006  . Fibromyalgia 11/22/2006    Past Medical History:  Diagnosis Date  . Abdominal aortic atherosclerosis (HCC) 11/2015   by CT  . Allergy   . Anemia   . Anxiety   . Automatic implantable cardioverter-defibrillator in situ 2012   a. MDT single lead ICD.  Marland Kitchen Bronchiectasis    a. possibly mild, treat URIs aggressively  . Cancer (HCC)    basal cell, arms & neck  . Chest pain    a. prev neg MV.  . Chronic sinusitis   . Chronic systolic CHF (congestive heart failure) (HCC)    a. EF 25-35%, LV dilation, significant MR, goal weight 150lbs;  b. 04/2014 Echo: EF 50-55%, Gr 1 DD.  Marland Kitchen  Depression   . Diarrhea 07/14/2008   Qualifier: Diagnosis of  By: Milinda Antis MD, Colon Flattery   . Disorder of vocal cords   . Essential hypertension   . Fibrocystic breast disease   . Fibromyalgia   . GERD (gastroesophageal reflux disease)   . H/O multiple allergies 10/10/2013  . Hemorrhoids   . Hemorrhoids   .  History of diverticulitis of colon   . History of pneumonia 2012  . Insomnia   . Irritable bowel syndrome   . Laryngeal nodule    Dr. Thelma Comp  . Lung disease 07/14/11  . Migraine without aura, without mention of intractable migraine without mention of status migrainosus   . Mitral regurgitation   . Nonischemic cardiomyopathy (HCC)    a. Recovered LV fxn - prev as low as 25%, now 50-55% by echo 04/2014;  b. 2012 s/p MDT [id number] [place] VR single lead ICD.  Marland Kitchen Osteoarthritis    knees, back, hands, low back,   . Osteoporosis 01/2013   T-2.7 spine (01/2016)  . Pneumonia 2011  . PONV (postoperative nausea and vomiting)   . Pre-syncope   . Pure hypercholesterolemia   . sigmoid diverticulitis with perforation, abscess and fistula 09/30/2013  . Sleep apnea 2010   borderline, inconclusive- /w [place]     Past Surgical History:  Procedure Laterality Date  . ABDOMINAL HYSTERECTOMY  1987   w/BSO  . ANAL RECTAL MANOMETRY N/A 02/02/2016   Procedure: ANO RECTAL MANOMETRY;  Surgeon: Napoleon Form, MD;  Location: [facility];  Service: Endoscopy;  Laterality: N/A;  . APPENDECTOMY    . AUGMENTATION MAMMAPLASTY    . BREAST BIOPSY     "I've had 2-3 bx; all benign"  . breast cystectomies     due to FCBD  . BREAST MASS EXCISION  01/2010   Left-fibrocystic change w/intraductal papilloma, no malignancy  . carotid US  03/2015   1-39% LICA, normal RICA  . [employer] Bunionectomy  11/04/08   Right Great Toe (Dr. Lestine Box)  . COLONOSCOPY  1998   nml (sigmoid-Gilbert-nol)  . Colonoscopy  01/04/04   polyps, diverticulosis/severe  . COLOSTOMY N/A 10/02/2013   DESCENDING COLOSTOMY;  Surgeon: Wilmon Arms. Tsuei, MD  . Gerri Spore REVERSAL N/A 02/10/2014   Manus Rudd, MD  . CT chest  12/2010   possible bronchiectasis lower lobes, 02/2011 - enlarged bilateral effusions, bibasilar atx  . CT chest  04/2011   no PE.  abnormal R hilar and mediastinal adenopathy --> on rpt 05/2011, resolved  . DEXA  03/28/99    osteopenia L/S  . DEXA  03/12/03   2.2 Spine 0.1 Hip  Osteopenia  . DEXA  11/14/05   1.9 Spine 0.3 Hip  slight improvement  . DEXA  11/22/06   improved osteo and fem neck  . DEXA  01/2013   T score -2.6 at spine  . EMG/MCV  10/16/01   + mild carpal tunnel  . ESOPHAGOGASTRODUODENOSCOPY  09/2014   small HH, irregular Z line, fundic gland polyps with mild gastritis ([place])  . Flex laryngoscopy  06/11/06   (Juengel) nml  . Head MRI, Head CT  10/1998   ? H/A focus ([place])  . ICD placement  07/14/11   w/pacemaker  . IMPLANTABLE CARDIOVERTER DEFIBRILLATOR IMPLANT N/A 07/14/2011   IMPLANTABLE CARDIOVERTER DEFIBRILLATOR IMPLANT;  Surgeon: Marinus Maw, MD;  Location: [facility] CATH LAB;  Service: Cardiovascular;  Laterality: N/A;  . KNEE ARTHROSCOPY W/ ORIF     Left; "meniscus tear"  . KNEE  SURGERY     left  . L/S films  11/21/01   nml; Right hip nml  . LASIK     left eye  . MANDIBLE SURGERY     "got about 6 pins in the bottom of my jaw"  . MANDIBLE SURGERY    . MRI Right Hip  12/12/01   Tendonitis Gluteus Medius to Gtr Trochanter Neg Bursitis (Dr. Montez Morita)  . MRI U/S  11/21/01   min. DDD L/3-4  . NCS  11/2014   L ulnar neuropathy, mild B median nerve entrapment (Ramos)  . OSTEOTOMY     Left foot  . PARTIAL COLECTOMY N/A 10/02/2013   PARTIAL COLECTOMY;  Surgeon: Wilmon Arms. Tsuei, MD  . sinus surgery     x3 with balloon   . stress myoview  02/2011   no significant ischemia  . TOE AMPUTATION     "toe beside baby toe on left foot; got gangrene from corn"  . TONSILLECTOMY AND ADENOIDECTOMY    . US ECHOCARDIOGRAPHY  04/2011   mildly dilated LV, EF 25-30%, hypokinesis, restrictive physiology, mod MR, no AS    Social History   Social History  . Marital status: Married    Spouse name: N/A  . Number of children: 1  . Years of education: N/A   Occupational History  . 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 Makes [employer]    Social History Main Topics  . Smoking status: Never  Smoker  . Smokeless tobacco: Never Used     Comment: Lived with smokers x 23 yrs, smoked herself "for a week"  . Alcohol use No  . Drug use: No  . Sexual activity: Not Currently   Other Topics Concern  . Not on file   Social History Narrative   1 adopted child      Lives with husband             Family History  Problem Relation Age of Onset  . Lung cancer Father     + smoker  . Colon polyps Father   . Dementia Mother   . Osteoporosis Mother     Lumbar spine  . Stroke Mother     x 5 @ 7 YOA  . Arthritis Mother     hands  . Emphysema Mother   . Alcohol abuse Paternal Grandfather   . Stroke Paternal Grandfather     ?  Marland Kitchen Heart disease Paternal Grandfather   . Hypertension Maternal Grandfather     ?  Marland Kitchen Heart disease Paternal Grandmother   . Colon cancer Neg Hx   . Esophageal cancer Neg Hx   . Gallbladder disease Neg Hx     Allergies  Allergen Reactions  . Antihistamines, Chlorpheniramine-Type Other (See Comments)    Makes her eyes look like she sees strobe lights  . Codeine Other (See Comments)    Confusion and dizziness  . Cyclobenzaprine Other (See Comments)    Adverse reaction - back and throat pain, dizziness, exhaustion  . Cymbalta [Duloxetine Hcl] Other (See Comments)    "body spasms and made me feel weird in my head"   . Influenza Vac Split [Flu Virus Vaccine] Other (See Comments)    "allergic to concentrated eggs that are in vaccine"  . Penicillins Other (See Comments)    REACTION: "told 50 years ago I couldn't take it; might be immune to it", able to take amoxicillin  . Prevnar [Pneumococcal 13-Val Conj Vacc] Other (See Comments)  Egg allergy  . Sulfasalazine Other (See Comments)    "makes my whole body smell like sulfa & makes me sick just smelling it"  . Atorvastatin Other (See Comments)    Severe muscle cramps to generic atorvastatin.  Able to take brand lipitor.  . Chlorhexidine Other (See Comments)    blisters  . Dilaudid [Hydromorphone  Hcl] Other (See Comments)    Feels like she is burning internally   . Latex Nausea Only, Rash and Other (See Comments)    Pain and infection  . Morphine And Related Other (See Comments)    Internally feels like she is on fire   . Pravastatin Other (See Comments)    myalgias  . Prevacid [Lansoprazole] Other (See Comments)    Worsened GI side effects  . Red Dye Nausea Only and Swelling  . Rosuvastatin Other (See Comments)    Was not effective controlling lipids  . Simvastatin Other (See Comments)    Leg cramps  . Other     NOTE: pt is able to take cephalosporins without reaction  . Tape Other (See Comments)    All adhesives  - Blisters, itching and burning   . Metaxalone Other (See Comments)    REACTION: unknown    Medication list reviewed and updated in full in [place] [facility].  ROS: GEN: Acute illness details above GI: Tolerating PO intake GU: maintaining adequate hydration and urination Pulm: possible SOB Interactive and getting along well at home.  Otherwise, ROS is as per the HPI.  Objective:   BP 100/60   Pulse 73   Temp 97.6 F (36.4 C) (Oral)   Ht 5' 5.5" (1.664 m)   Wt 180 lb 4 oz (81.8 kg)   SpO2 98%   BMI 29.54 kg/m    GEN: A and O x 3. WDWN. NAD.    ENT: Nose clear, ext NML.  No LAD.  No JVD.  TM's clear. Oropharynx clear.  PULM: Normal WOB, no distress. No crackles, wheezes, rhonchi. CV: RRR, no M/G/R, No rubs, No JVD.   EXT: warm and well-perfused, No c/c/e. PSYCH: Pleasant and conversant.    Laboratory and Imaging Data:  Assessment and Plan:   Walking pneumonia  Multiple allergies  Chronic systolic CHF (congestive heart failure) (HCC)  ? Laryngitis that has been persistent with more pulm symptoms, failure doxy and sx > 14 days.  Follow-up: No Follow-up on file.  Meds ordered this encounter  Medications  . levofloxacin (LEVAQUIN) 500 MG tablet    Sig: Take 1 tablet (500 mg total) by mouth daily.    Dispense:  10 tablet    Refill:   0   Medications Discontinued During This Encounter  Medication Reason  . doxycycline (VIBRA-TABS) 100 MG tablet Completed Course  . Probiotic Product (PROBIOTIC DAILY) CAPS Completed Course   No orders of the defined types were placed in this encounter.   Signed,  Elpidio Galea. Carigan Lister, MD   Allergies as of 08/07/2016      Reactions   Antihistamines, Chlorpheniramine-type Other (See Comments)   Makes her eyes look like she sees strobe lights   Codeine Other (See Comments)   Confusion and dizziness   Cyclobenzaprine Other (See Comments)   Adverse reaction - back and throat pain, dizziness, exhaustion   Cymbalta [duloxetine Hcl] Other (See Comments)   "body spasms and made me feel weird in my head"    Influenza Vac Split [flu Virus Vaccine] Other (See Comments)   "allergic to concentrated eggs  that are in vaccine"   Penicillins Other (See Comments)   REACTION: "told 50 years ago I couldn't take it; might be immune to it", able to take amoxicillin   Prevnar [pneumococcal 13-val Conj Vacc] Other (See Comments)   Egg allergy   Sulfasalazine Other (See Comments)   "makes my whole body smell like sulfa & makes me sick just smelling it"   Atorvastatin Other (See Comments)   Severe muscle cramps to generic atorvastatin.  Able to take brand lipitor.   Chlorhexidine Other (See Comments)   blisters   Dilaudid [hydromorphone Hcl] Other (See Comments)   Feels like she is burning internally    Latex Nausea Only, Rash, Other (See Comments)   Pain and infection   Morphine And Related Other (See Comments)   Internally feels like she is on fire    Pravastatin Other (See Comments)   myalgias   Prevacid [lansoprazole] Other (See Comments)   Worsened GI side effects   Red Dye Nausea Only, Swelling   Rosuvastatin Other (See Comments)   Was not effective controlling lipids   Simvastatin Other (See Comments)   Leg cramps   Other    NOTE: pt is able to take cephalosporins without reaction    Tape Other (See Comments)   All adhesives  - Blisters, itching and burning   Metaxalone Other (See Comments)   REACTION: unknown      Medication List       Accurate as of 08/07/16  9:46 AM. Always use your most recent med list.          albuterol 108 (90 Base) MCG/ACT inhaler Commonly known as:  PROVENTIL HFA;VENTOLIN HFA Inhale 2 puffs into the lungs every 6 (six) hours as needed for wheezing or shortness of breath.   alendronate 70 MG tablet Commonly known as:  FOSAMAX Take 70 mg by mouth once a week. Patient takes every Thursday   AMBULATORY NON FORMULARY MEDICATION Medication Name:  1 squatty potty to use as needed   aspirin EC 81 MG tablet Take 81 mg by mouth daily.   CALCIUM + D PO Take 1 tablet by mouth daily.   carvedilol 6.25 MG tablet Commonly known as:  COREG TAKE 1 TABLET (6.25 MG TOTAL) BY MOUTH 2 (TWO) TIMES DAILY WITH A MEAL.   cholecalciferol 1000 units tablet Commonly known as:  VITAMIN D Take 2,000 Units by mouth daily.   dextromethorphan 30 MG/5ML liquid Commonly known as:  DELSYM Take 15 mg by mouth at bedtime as needed for cough.   docusate sodium 100 MG capsule Commonly known as:  COLACE Take 100 mg by mouth daily.   ezetimibe 10 MG tablet Commonly known as:  ZETIA TAKE 1 TABLET (10 MG TOTAL) BY MOUTH DAILY.   Ferrous Sulfate 143 (45 Fe) MG Tbcr Take 1 tablet by mouth every other day.   levofloxacin 500 MG tablet Commonly known as:  LEVAQUIN Take 1 tablet (500 mg total) by mouth daily.   omeprazole 40 MG capsule Commonly known as:  PRILOSEC Take 1 capsule (40 mg total) by mouth daily.   potassium chloride 10 MEQ tablet Commonly known as:  K-DUR Take 10 mEq by mouth as directed. Take daily as needed   Red Yeast Rice 600 MG Caps Take 1 capsule (600 mg total) by mouth 2 (two) times daily.   spironolactone 12.5 mg Tabs tablet Commonly known as:  ALDACTONE Take 12.5 mg by mouth daily.   torsemide 10 MG tablet Commonly known as:  DEMADEX Take 10 mg by mouth daily as needed (swelling/fluid).   triamcinolone 55 MCG/ACT Aero nasal inhaler Commonly known as:  NASACORT Place 2 sprays into the nose 2 (two) times daily.   valsartan 80 MG tablet Commonly known as:  DIOVAN Take 1 tablet (80 mg total) by mouth daily.   VITAMIN B6 PO Take 1 capsule by mouth daily. Reported on 07/12/2015   vitamin C 1000 MG tablet Take 1,000 mg by mouth daily.   vitamin E 100 UNIT capsule Take 100 Units by mouth daily.

## 2016-08-07 NOTE — Progress Notes (Signed)
Pre visit review using our clinic review tool, if applicable. No additional management support is needed unless otherwise documented below in the visit note. 

## 2016-08-15 DIAGNOSIS — L820 Inflamed seborrheic keratosis: Secondary | ICD-10-CM | POA: Diagnosis not present

## 2016-08-15 DIAGNOSIS — M1711 Unilateral primary osteoarthritis, right knee: Secondary | ICD-10-CM | POA: Diagnosis not present

## 2016-08-15 DIAGNOSIS — M25561 Pain in right knee: Secondary | ICD-10-CM | POA: Diagnosis not present

## 2016-08-15 DIAGNOSIS — L738 Other specified follicular disorders: Secondary | ICD-10-CM | POA: Diagnosis not present

## 2016-08-15 DIAGNOSIS — L720 Epidermal cyst: Secondary | ICD-10-CM | POA: Diagnosis not present

## 2016-08-15 DIAGNOSIS — C44619 Basal cell carcinoma of skin of left upper limb, including shoulder: Secondary | ICD-10-CM | POA: Diagnosis not present

## 2016-08-15 DIAGNOSIS — G8929 Other chronic pain: Secondary | ICD-10-CM | POA: Diagnosis not present

## 2016-08-15 DIAGNOSIS — D485 Neoplasm of uncertain behavior of skin: Secondary | ICD-10-CM | POA: Diagnosis not present

## 2016-08-15 DIAGNOSIS — M25511 Pain in right shoulder: Secondary | ICD-10-CM | POA: Diagnosis not present

## 2016-08-15 DIAGNOSIS — L853 Xerosis cutis: Secondary | ICD-10-CM | POA: Diagnosis not present

## 2016-08-16 ENCOUNTER — Ambulatory Visit (INDEPENDENT_AMBULATORY_CARE_PROVIDER_SITE_OTHER): Payer: Medicare Other | Admitting: *Deleted

## 2016-08-16 DIAGNOSIS — I428 Other cardiomyopathies: Secondary | ICD-10-CM

## 2016-08-16 LAB — CUP PACEART REMOTE DEVICE CHECK
Battery Voltage: 3.01 V
Brady Statistic RV Percent Paced: 0 %
Date Time Interrogation Session: 20180404131112
HighPow Impedance: 82 Ohm
Implantable Lead Implant Date: 20130301
Implantable Lead Location: 753860
Implantable Lead Model: 6935
Implantable Pulse Generator Implant Date: 20130301
Lead Channel Impedance Value: 779 Ohm
Lead Channel Pacing Threshold Amplitude: 0.375 V
Lead Channel Pacing Threshold Pulse Width: 0.4 ms
Lead Channel Sensing Intrinsic Amplitude: 7.5 mV
Lead Channel Sensing Intrinsic Amplitude: 7.5 mV
Lead Channel Setting Pacing Amplitude: 2.5 V
Lead Channel Setting Pacing Pulse Width: 0.4 ms
Lead Channel Setting Sensing Sensitivity: 0.3 mV

## 2016-08-16 NOTE — Progress Notes (Signed)
Remote ICD transmission.   

## 2016-08-17 ENCOUNTER — Encounter: Payer: Self-pay | Admitting: Cardiology

## 2016-08-24 DIAGNOSIS — C4491 Basal cell carcinoma of skin, unspecified: Secondary | ICD-10-CM | POA: Diagnosis not present

## 2016-08-24 DIAGNOSIS — C44619 Basal cell carcinoma of skin of left upper limb, including shoulder: Secondary | ICD-10-CM | POA: Diagnosis not present

## 2016-08-30 ENCOUNTER — Telehealth: Payer: Self-pay | Admitting: Cardiovascular Disease

## 2016-08-30 NOTE — Telephone Encounter (Signed)
S/w pt. She reports feeling like she is back in afib, feeling SOB and very tired in the afternoon.  Coughing, green sputum, just recovering from bronchitis and pneumonia. Sputum production has improved but is still happening 3-4 times a day. Advised pt to f/u w/PCP regarding recent diagnosis and continued coughing. Pt will send transmission. S/w Barbara in device clinic. Since pt has RV lead, transmission most likely will not help in determining rhythm.  Last OV Feb 22. EF 40-45%.  Pt would like appt w/Dr. [name].  Scheduled 4/19, 9am

## 2016-08-30 NOTE — Progress Notes (Signed)
Cardiology Office Note  Date:  08/31/2016   ID:  LAPORCHA MARCHESI, DOB 1946-03-07, MRN [id number]  PCP:  [name], MD   Chief Complaint  Patient presents with  . other    Early f/u. feeling like she is back in afib, feeling SOB/tired, productive cough, frequent headaches, feels o2 is low, bilateral leg cramping/weakness, gas. Reviewed meds with pt verbally.     HPI:  71 y/o female with history of  fibromyalgia, irritable bowel,  pneumonia, chronic cough felt secondary to GERD, abnormal PFTs in the past,  shortness of breath dating back to September 2012 that presented acutely,  CT scan showing progressive pleural effusion ,  October 2012 Cardiomyopathy ejection fraction estimated at 25-35%,  Echo September 2013 with ejection fraction 30-35%, ICD placed Previous Lexiscan Myoview showed no significant ischemia, no EKG changes concerning for ischemia.  EF 50 to 55% in 02/2013, 12/15, And November 2016 She received pneumonia vaccine January 2017, had severe reaction Echocardiogram February 23rd 2017 with ejection fraction 30-35% She presents for follow-up today for follow-up of her Nonischemic cardiomyopathy previous abdominal surgery May 2015  EF 25 to 30% in 06/2015 EF 35 to 40% in 12/2016 EF 40 to 45% in 07/2016 (recent echocardiogram results discussed with her on today's visit)  In follow-up today she is getting over bronchitis for several weeks ago, had 2 rounds of antibiotics  feels Fatigue, worn out Palpitations at times "I am pale" Legs hurt Veins sticking out  Occasional cough with green phlegm Planning a trip to [facility] No significant ankle swelling, continues to take torsemide sparingly One month ago had dehydration, creatinine 1.3 with elevated BUN  Continues to have problems with her GI tract  EKG reviewed by myself on today's visit NSR rate 77 with no significant ST or T-wave changes  Other past medical history reviewed  Previously  participated in cardiac rehabilitation, pulmonary rehabilitation, has not maintained her exercise  Did not tolerate entresto (" too expensive")  Previous Gi problems, cysts ("too much bad bacteria" in her gut) Took ABX 2 weeks Abdomen still giving her trouble but improved  Problems with arthritis, received numerous cortisone shots in her hands  On a previous office visit, we started her on entresto low-dose twice a day for systolic dysfunction.She took the medication until she ran out of the medication on her recent cruise 2-3 weeks agoReports that she did not feel well on the cruise, slept many hours per day, had leg discomfortShe stopped the medication then reports that she felt better when she got home. She does not want to retry the extresto. Also reports it was very expensive for her, $179 per month  Recent stress test showed perfusion abnormality to the anterior wall, significant artifact , attenuation issue secondary to left bundle branch block Cardiogram with ejection fraction 30-35%  Possible severe reaction to pneumonia shot with what sounds like viral symptoms, Requiring prednisone, antibiotics.  chronic abdominal pain, stress from her grandchildren. Previousid ultrasound showing minimal plaque on the left less than 39%, none on the right Previous dehydration with overuse of torsemide Eating more, no regular exercise, sedentary in general secondary to fibromyalgia and fatigue  Previous admission to [facility] in December 2012 for bronchitis and hypoxia as well as systolic CHF. She had antibiotics, prednisone and diuresis and has felt better since discharge. ACE inhibitor placed on hold secondary to chronic cough.  S/P ICD 10 days ago for EF <35%. Goal weight had been in the low 150 range.  Previous Cardiac MRI showed ejection fraction 34 % ICD placed by Dr. Lovena Le.  Previous symptoms of malaise and fatigue, tachycardia, weight gain, leg swelling  . previously taking high doses of diuretics for weight gain causing renal dysfunction, dehydration. For creatinine 1.9 we suggested she cut back on her diuretics. Prior weight was in the low 160 pound range  History of diverticulosis, abscess formation, perforation requiring a long course of antibiotics with PICC line, subsequent CT scan Sep 30 2013 showing free air, abscess requiring partial colectomy. She has an ostomy. Today she has lost 22 pounds by her report.   Previous echocardiogram shows improvement in her ejection fraction up to 50%. Challenging image quality. On my own review, it does appear at least 45, if not 50%.   PMH:   has a past medical history of Abdominal aortic atherosclerosis ([place]) (11/2015); Allergy; Anemia; Anxiety; Automatic implantable cardioverter-defibrillator in situ (2012); Bronchiectasis; Cancer ([place]); Chest pain; Chronic sinusitis; Chronic systolic CHF (congestive heart failure) ([place]); Depression; Diarrhea (07/14/2008); Disorder of vocal cords; Essential hypertension; Fibrocystic breast disease; Fibromyalgia; GERD (gastroesophageal reflux disease); H/O multiple allergies (10/10/2013); Hemorrhoids; Hemorrhoids; History of diverticulitis of colon; History of pneumonia (2012); Insomnia; Irritable bowel syndrome; Laryngeal nodule; Lung disease (07/14/11); Migraine without aura, without mention of intractable migraine without mention of status migrainosus; Mitral regurgitation; Nonischemic cardiomyopathy ([place]); Osteoarthritis; Osteoporosis (01/2013); Pneumonia (2011); PONV (postoperative nausea and vomiting); Pre-syncope; Pure hypercholesterolemia; sigmoid diverticulitis with perforation, abscess and fistula (09/30/2013); and Sleep apnea (2010).  PSH:    Past Surgical History:  Procedure Laterality Date  . ABDOMINAL HYSTERECTOMY  1987   w/BSO  . ANAL RECTAL MANOMETRY N/A 02/02/2016   Procedure: ANO RECTAL MANOMETRY;  Surgeon: Mauri Pole, MD;  Location: [facility];   Service: Endoscopy;  Laterality: N/A;  . APPENDECTOMY    . AUGMENTATION MAMMAPLASTY    . BREAST BIOPSY     "I've had 2-3 bx; all benign"  . breast cystectomies     due to FCBD  . BREAST MASS EXCISION  01/2010   Left-fibrocystic change w/intraductal papilloma, no malignancy  . carotid US  60/1093   2-35% LICA, normal RICA  . [employer] Bunionectomy  11/04/08   Right Great Toe (Dr. Beola Cord)  . COLONOSCOPY  1998   nml (sigmoid-Gilbert-nol)  . Colonoscopy  01/04/04   polyps, diverticulosis/severe  . COLOSTOMY N/A 10/02/2013   DESCENDING COLOSTOMY;  Surgeon: Imogene Burn. Tsuei, MD  . Derrill Memo REVERSAL N/A 02/10/2014   [name], MD  . CT chest  12/2010   possible bronchiectasis lower lobes, 02/2011 - enlarged bilateral effusions, bibasilar atx  . CT chest  04/2011   no PE.  abnormal R hilar and mediastinal adenopathy --> on rpt 05/2011, resolved  . DEXA  03/28/99   osteopenia L/S  . DEXA  03/12/03   2.2 Spine 0.1 Hip  Osteopenia  . DEXA  11/14/05   1.9 Spine 0.3 Hip  slight improvement  . DEXA  11/22/06   improved osteo and fem neck  . DEXA  01/2013   T score -2.6 at spine  . EMG/MCV  10/16/01   + mild carpal tunnel  . ESOPHAGOGASTRODUODENOSCOPY  09/2014   small HH, irregular Z line, fundic gland polyps with mild gastritis ([place])  . Flex laryngoscopy  06/11/06   ([place]) nml  . Head MRI, Head CT  10/1998   ? H/A focus ([place])  . ICD placement  07/14/11   w/pacemaker  . IMPLANTABLE CARDIOVERTER DEFIBRILLATOR IMPLANT N/A 07/14/2011   IMPLANTABLE  CARDIOVERTER DEFIBRILLATOR IMPLANT;  Surgeon: Evans Lance, MD;  Location: [facility] CATH LAB;  Service: Cardiovascular;  Laterality: N/A;  . KNEE ARTHROSCOPY W/ ORIF     Left; "meniscus tear"  . KNEE SURGERY     left  . L/S films  11/21/01   nml; Right hip nml  . LASIK     left eye  . MANDIBLE SURGERY     "got about 6 pins in the bottom of my jaw"  . MANDIBLE SURGERY    . MRI Right Hip  12/12/01   Tendonitis Gluteus Medius to Gtr Trochanter Neg  Bursitis (Dr. Eulas Post)  . MRI U/S  11/21/01   min. DDD L/3-4  . NCS  11/2014   L ulnar neuropathy, mild B median nerve entrapment ([place])  . OSTEOTOMY     Left foot  . PARTIAL COLECTOMY N/A 10/02/2013   PARTIAL COLECTOMY;  Surgeon: Imogene Burn. Tsuei, MD  . sinus surgery     x3 with balloon   . stress myoview  02/2011   no significant ischemia  . TOE AMPUTATION     "toe beside baby toe on left foot; got gangrene from corn"  . TONSILLECTOMY AND ADENOIDECTOMY    . US ECHOCARDIOGRAPHY  04/2011   mildly dilated LV, EF 25-30%, hypokinesis, restrictive physiology, mod MR, no AS    Current Outpatient Prescriptions  Medication Sig Dispense Refill  . albuterol (PROVENTIL HFA;VENTOLIN HFA) 108 (90 Base) MCG/ACT inhaler Inhale 2 puffs into the lungs every 6 (six) hours as needed for wheezing or shortness of breath. 1 Inhaler 0  . alendronate (FOSAMAX) 70 MG tablet Take 70 mg by mouth once a week. Patient takes every Thursday    . ALPRAZolam (XANAX) 0.5 MG tablet Take 2.5 mg by mouth daily.    . AMBULATORY NON FORMULARY MEDICATION Medication Name:  1 squatty potty to use as needed 1 each 0  . Ascorbic Acid (VITAMIN C) 1000 MG tablet Take 1,000 mg by mouth daily.    Marland Kitchen aspirin EC 81 MG tablet Take 81 mg by mouth daily.    . Calcium Carbonate-Vitamin D (CALCIUM + D PO) Take 1 tablet by mouth daily.    . carvedilol (COREG) 6.25 MG tablet TAKE 1 TABLET (6.25 MG TOTAL) BY MOUTH 2 (TWO) TIMES DAILY WITH A MEAL. 180 tablet 3  . cholecalciferol (VITAMIN D) 1000 units tablet Take 2,000 Units by mouth daily.    Marland Kitchen dextromethorphan (DELSYM) 30 MG/5ML liquid Take 15 mg by mouth at bedtime as needed for cough.    . ezetimibe (ZETIA) 10 MG tablet TAKE 1 TABLET (10 MG TOTAL) BY MOUTH DAILY. 30 tablet 6  . levofloxacin (LEVAQUIN) 500 MG tablet Take 1 tablet (500 mg total) by mouth daily. (Patient taking differently: Take 500 mg by mouth as needed. ) 10 tablet 0  . omeprazole (PRILOSEC) 40 MG capsule Take 1 capsule (40  mg total) by mouth daily.    . potassium chloride (K-DUR) 10 MEQ tablet Take 10 mEq by mouth as directed. Take daily as needed    . Pyridoxine HCl (VITAMIN B6 PO) Take 1 capsule by mouth daily. Reported on 07/12/2015    . Red Yeast Rice 600 MG CAPS Take 1 capsule (600 mg total) by mouth 2 (two) times daily.    Marland Kitchen spironolactone (ALDACTONE) 12.5 mg TABS tablet Take 12.5 mg by mouth daily.    Marland Kitchen torsemide (DEMADEX) 10 MG tablet Take 10 mg by mouth daily as needed (swelling/fluid).    Marland Kitchen  triamcinolone (NASACORT) 55 MCG/ACT AERO nasal inhaler Place 2 sprays into the nose 2 (two) times daily.    . valsartan (DIOVAN) 80 MG tablet Take 1 tablet (80 mg total) by mouth daily. 30 tablet 11  . vitamin E 100 UNIT capsule Take 100 Units by mouth daily.     No current facility-administered medications for this visit.      Allergies:   Antihistamines, chlorpheniramine-type; Codeine; Cyclobenzaprine; Cymbalta [duloxetine hcl]; Influenza vac split [flu virus vaccine]; Penicillins; Prevnar [pneumococcal 13-val conj vacc]; Sulfasalazine; Atorvastatin; Chlorhexidine; Dilaudid [hydromorphone hcl]; Latex; Morphine and related; Pravastatin; Prevacid [lansoprazole]; Red dye; Rosuvastatin; Simvastatin; Other; Tape; and Metaxalone   Social History:  The patient  reports that she has never smoked. She has never used smokeless tobacco. She reports that she does not drink alcohol or use drugs.   Family History:   family history includes Alcohol abuse in her paternal grandfather; Arthritis in her mother; Colon polyps in her father; Dementia in her mother; Emphysema in her mother; Heart disease in her paternal grandfather and paternal grandmother; Hypertension in her maternal grandfather; Lung cancer in her father; Osteoporosis in her mother; Stroke in her mother and paternal grandfather.    Review of Systems: Review of Systems  Constitutional: Positive for malaise/fatigue.  Respiratory: Negative.   Cardiovascular: Positive  for palpitations.  Gastrointestinal: Negative.   Musculoskeletal: Negative.        Legs are sore to touch  Neurological: Positive for weakness.  Psychiatric/Behavioral: Negative.   All other systems reviewed and are negative.    PHYSICAL EXAM: VS:  BP 110/70 (BP Location: Right Arm, Patient Position: Sitting, Cuff Size: Normal)   Pulse 77   Ht 5' 5.5" (1.664 m)   Wt 179 lb 8 oz (81.4 kg)   SpO2 96%   BMI 29.42 kg/m  , BMI Body mass index is 29.42 kg/m. GEN: Well nourished, well developed, in no acute distress , obese HEENT: normal  Neck: no JVD, carotid bruits, or masses Cardiac: RRR; no murmurs, rubs, or gallops,no edema  Respiratory:  clear to auscultation bilaterally, normal work of breathing GI: soft, nontender, nondistended, + BS MS: no deformity or atrophy  Skin: warm and dry, no rash Neuro:  Strength and sensation are intact Psych: euthymic mood, full affect    Recent Labs: 05/23/2016: TSH 1.24 07/31/2016: ALT 37; BUN 31; Creatinine, Ser 1.31; Hemoglobin 12.4; Platelets 212.0; Potassium 5.0; Sodium 139    Lipid Panel Lab Results  Component Value Date   CHOL 264 (H) 05/23/2016   HDL 46.60 05/23/2016   LDLCALC 109 (H) 05/27/2014   TRIG 267.0 (H) 05/23/2016      Wt Readings from Last 3 Encounters:  08/31/16 179 lb 8 oz (81.4 kg)  08/07/16 180 lb 4 oz (81.8 kg)  07/31/16 178 lb 12 oz (81.1 kg)    12/2015: weight 171   ASSESSMENT AND PLAN:  Chronic systolic CHF (congestive heart failure) ([place]) - Plan: EKG 12-Lead, ECHOCARDIOGRAM COMPLETE No changes to her medication regiment Recent echocardiogram is slight improvement of her ejection fraction Did not tolerate entresto. Found it  expensive  Cardiomyopathy, dilated, nonischemic ([place]) - Plan: EKG 12-Lead, ECHOCARDIOGRAM COMPLETE  No further testing needed, continue current medications  Essential hypertension - Plan: EKG 12-Lead, ECHOCARDIOGRAM COMPLETE No medication changes made, continue carvedilol,  Diovan  ICD (implantable cardioverter-defibrillator) in place - Plan: EKG 12-Lead, ECHOCARDIOGRAM COMPLETE Followed by Dr. Lovena Le. Paroxysmal left bundle branch block   Chronic fatigue Likely multifactorial, sedentary condition, fibromyalgia Recommended regular exercise  program  LBBB (left bundle branch block) Prior EKG showing left bundle branch block, today this has resolved   Total encounter time more than 25 minutes  Greater than 50% was spent in counseling and coordination of care with the patient   Disposition:   F/U  6 months   Orders Placed This Encounter  Procedures  . EKG 12-Lead     Signed, Esmond Plants, M.D., Ph.D. 08/31/2016  [place] [phone]\

## 2016-08-30 NOTE — Telephone Encounter (Signed)
Pt states she thinks she is in afib, states this has been going on for about 4 days. She states at night she feels fatigue, and this is when her heart starts"acting up"

## 2016-08-31 ENCOUNTER — Encounter: Payer: Self-pay | Admitting: Cardiology

## 2016-08-31 ENCOUNTER — Ambulatory Visit (INDEPENDENT_AMBULATORY_CARE_PROVIDER_SITE_OTHER): Payer: Medicare Other | Admitting: Cardiovascular Disease

## 2016-08-31 VITALS — BP 110/70 | HR 77 | Ht 65.5 in | Wt 179.5 lb

## 2016-08-31 DIAGNOSIS — I420 Dilated cardiomyopathy: Secondary | ICD-10-CM | POA: Diagnosis not present

## 2016-08-31 DIAGNOSIS — R5382 Chronic fatigue, unspecified: Secondary | ICD-10-CM

## 2016-08-31 DIAGNOSIS — Z95810 Presence of automatic (implantable) cardiac defibrillator: Secondary | ICD-10-CM | POA: Diagnosis not present

## 2016-08-31 DIAGNOSIS — R5381 Other malaise: Secondary | ICD-10-CM

## 2016-08-31 DIAGNOSIS — I5022 Chronic systolic (congestive) heart failure: Secondary | ICD-10-CM

## 2016-08-31 DIAGNOSIS — R079 Chest pain, unspecified: Secondary | ICD-10-CM | POA: Diagnosis not present

## 2016-08-31 DIAGNOSIS — R059 Cough, unspecified: Secondary | ICD-10-CM

## 2016-08-31 DIAGNOSIS — R05 Cough: Secondary | ICD-10-CM | POA: Diagnosis not present

## 2016-08-31 NOTE — Progress Notes (Signed)
Letter  

## 2016-08-31 NOTE — Patient Instructions (Signed)

## 2016-09-08 ENCOUNTER — Ambulatory Visit (INDEPENDENT_AMBULATORY_CARE_PROVIDER_SITE_OTHER)
Admission: RE | Admit: 2016-09-08 | Discharge: 2016-09-08 | Disposition: A | Discharge status: Home / Self Care | Payer: Medicare Other | Source: Ambulatory Visit | Attending: Family Medicine | Admitting: Family Medicine

## 2016-09-08 ENCOUNTER — Encounter: Payer: Self-pay | Admitting: Family Medicine

## 2016-09-08 ENCOUNTER — Ambulatory Visit (INDEPENDENT_AMBULATORY_CARE_PROVIDER_SITE_OTHER): Payer: Medicare Other | Admitting: Family Medicine

## 2016-09-08 VITALS — BP 118/82 | HR 74 | Temp 97.8°F | Wt 180.8 lb

## 2016-09-08 DIAGNOSIS — R5382 Chronic fatigue, unspecified: Secondary | ICD-10-CM

## 2016-09-08 DIAGNOSIS — R1084 Generalized abdominal pain: Secondary | ICD-10-CM

## 2016-09-08 DIAGNOSIS — E785 Hyperlipidemia, unspecified: Secondary | ICD-10-CM | POA: Diagnosis not present

## 2016-09-08 DIAGNOSIS — Z9889 Other specified postprocedural states: Secondary | ICD-10-CM | POA: Diagnosis not present

## 2016-09-08 DIAGNOSIS — R5381 Other malaise: Secondary | ICD-10-CM | POA: Diagnosis not present

## 2016-09-08 DIAGNOSIS — I700 Atherosclerosis of aorta: Secondary | ICD-10-CM

## 2016-09-08 DIAGNOSIS — K582 Mixed irritable bowel syndrome: Secondary | ICD-10-CM | POA: Diagnosis not present

## 2016-09-08 DIAGNOSIS — N183 Chronic kidney disease, stage 3 unspecified: Secondary | ICD-10-CM

## 2016-09-08 DIAGNOSIS — R202 Paresthesia of skin: Secondary | ICD-10-CM | POA: Diagnosis not present

## 2016-09-08 DIAGNOSIS — K5900 Constipation, unspecified: Secondary | ICD-10-CM | POA: Diagnosis not present

## 2016-09-08 MED ORDER — ALPRAZOLAM 0.5 MG PO TABS: 0.2500 mg | ORAL_TABLET | Freq: Every evening | ORAL | 1 refills | Status: DC | PRN

## 2016-09-08 NOTE — Assessment & Plan Note (Addendum)
Chronic abd pain, h/o colostomy after perforated diverticulitis. h/o IBS mixed type. Anticipate adhesions contributing. Reviewed probiotic use - rec take PRN antibiotics.  Passing gas, had BM earlier today.  Check abd series today.

## 2016-09-08 NOTE — Assessment & Plan Note (Addendum)
Chronic, update renal panel when she returns for fasting labs.

## 2016-09-08 NOTE — Assessment & Plan Note (Signed)
Longstanding, ongoing. Check b12.

## 2016-09-08 NOTE — Assessment & Plan Note (Signed)
Not truly fasting today. I've asked her to return at her convenience for fasting labs.

## 2016-09-08 NOTE — Progress Notes (Signed)
BP 118/82   Pulse 74   Temp 97.8 F (36.6 C) (Oral)   Wt 180 lb 12 oz (82 kg)   SpO2 96%   BMI 29.62 kg/m    CC: f/u visit Subjective:    Patient ID: Ashley [name], female    DOB: 09-13-1945, 71 y.o.   MRN: [id number]  HPI: Ashley [name] is a 71 y.o. female presenting on 09/08/2016 for Follow-up (wants CXR for continuous cough)   Seen x3 in the past month with URI then walking pneumonia complicated by vomiting and diarrhea. Latest treated with levaquin 500mg  10 d course 08/07/2016. Persistent cough, spitting up clear phlegm, with occasional green. Thinks cough persists since she received pneumococcal vaccine last year. She did stop her probiotic due to ongoing diarrhea.   Ongoing chronic fatigue. Some nights sleeps well, other nights up 5 hours.  She does take xanax 1/2 tab PRN to help sleep.   Saw Dr [name] 2 months ago. Echo with slight improvement in EF. Did not tolerate entresto and was too expensive. Compliant with valsartan and spironolactone and carvediolol daily. Only takes lasix/Kdur once a week.   Endorses epigastric cramping, gassiness, as well as dull ache and heaviness substernally.   Relevant past medical, surgical, family and social history reviewed and updated as indicated. Interim medical history since our last visit reviewed. Allergies and medications reviewed and updated. Outpatient Medications Prior to Visit  Medication Sig Dispense Refill  . albuterol (PROVENTIL HFA;VENTOLIN HFA) 108 (90 Base) MCG/ACT inhaler Inhale 2 puffs into the lungs every 6 (six) hours as needed for wheezing or shortness of breath. 1 Inhaler 0  . alendronate (FOSAMAX) 70 MG tablet Take 70 mg by mouth once a week. Patient takes every Thursday    . AMBULATORY NON FORMULARY MEDICATION Medication Name:  1 squatty potty to use as needed 1 each 0  . Ascorbic Acid (VITAMIN C) 1000 MG tablet Take 1,000 mg by mouth daily.    Marland Kitchen aspirin EC 81 MG tablet Take 81 mg by mouth daily.    .  Calcium Carbonate-Vitamin D (CALCIUM + D PO) Take 1 tablet by mouth daily.    . carvedilol (COREG) 6.25 MG tablet TAKE 1 TABLET (6.25 MG TOTAL) BY MOUTH 2 (TWO) TIMES DAILY WITH A MEAL. 180 tablet 3  . cholecalciferol (VITAMIN D) 1000 units tablet Take 2,000 Units by mouth daily.    Marland Kitchen dextromethorphan (DELSYM) 30 MG/5ML liquid Take 15 mg by mouth at bedtime as needed for cough.    . ezetimibe (ZETIA) 10 MG tablet TAKE 1 TABLET (10 MG TOTAL) BY MOUTH DAILY. 30 tablet 6  . omeprazole (PRILOSEC) 40 MG capsule Take 1 capsule (40 mg total) by mouth daily.    . potassium chloride (K-DUR) 10 MEQ tablet Take 10 mEq by mouth as directed. Take daily as needed    . Pyridoxine HCl (VITAMIN B6 PO) Take 1 capsule by mouth daily. Reported on 07/12/2015    . Red Yeast Rice 600 MG CAPS Take 1 capsule (600 mg total) by mouth 2 (two) times daily.    Marland Kitchen spironolactone (ALDACTONE) 12.5 mg TABS tablet Take 12.5 mg by mouth daily.    Marland Kitchen torsemide (DEMADEX) 10 MG tablet Take 10 mg by mouth daily as needed (swelling/fluid).    . triamcinolone (NASACORT) 55 MCG/ACT AERO nasal inhaler Place 2 sprays into the nose 2 (two) times daily.    . valsartan (DIOVAN) 80 MG tablet Take 1 tablet (80 mg total) by mouth  daily. 30 tablet 11  . vitamin E 100 UNIT capsule Take 100 Units by mouth daily.    Marland Kitchen ALPRAZolam (XANAX) 0.5 MG tablet Take 2.5 mg by mouth daily.    Marland Kitchen levofloxacin (LEVAQUIN) 500 MG tablet Take 1 tablet (500 mg total) by mouth daily. (Patient not taking: Reported on 09/08/2016) 10 tablet 0   No facility-administered medications prior to visit.      Per HPI unless specifically indicated in ROS section below Review of Systems     Objective:    BP 118/82   Pulse 74   Temp 97.8 F (36.6 C) (Oral)   Wt 180 lb 12 oz (82 kg)   SpO2 96%   BMI 29.62 kg/m   Wt Readings from Last 3 Encounters:  09/08/16 180 lb 12 oz (82 kg)  08/31/16 179 lb 8 oz (81.4 kg)  08/07/16 180 lb 4 oz (81.8 kg)    Physical Exam    Constitutional: She appears well-developed and well-nourished. No distress.  HENT:  Right Ear: Hearing and tympanic membrane normal.  Left Ear: Hearing and tympanic membrane normal.  Mouth/Throat: Oropharynx is clear and moist. No oropharyngeal exudate.  Mild healthy appearing cerumen in R canal  Eyes: Conjunctivae are normal. Pupils are equal, round, and reactive to light.  Neck: No thyromegaly present.  Cardiovascular: Normal rate, regular rhythm, normal heart sounds and intact distal pulses.   No murmur heard. Pulmonary/Chest: Effort normal and breath sounds normal. No respiratory distress. She has no wheezes. She has no rales.  Abdominal: Soft. Bowel sounds are normal. She exhibits no distension and no mass. There is no hepatosplenomegaly. There is generalized tenderness. There is no rigidity, no rebound and no guarding.  Old healed scars  Musculoskeletal: She exhibits no edema.  Lymphadenopathy:    She has no cervical adenopathy.  Skin: Skin is warm and dry. No rash noted.  Psychiatric: She has a normal mood and affect.  Nursing note and vitals reviewed.      Assessment & Plan:   Problem List Items Addressed This Visit    Abdominal aortic atherosclerosis ([place])    Continue aspirin, RYR (statin intolerance)      Abdominal pain - Primary    Chronic abd pain, h/o colostomy after perforated diverticulitis. h/o IBS mixed type. Anticipate adhesions contributing. Reviewed probiotic use - rec take PRN antibiotics.  Passing gas, had BM earlier today.  Check abd series today.       Relevant Orders   DG Abd 2 Views   Chronic fatigue and malaise    Longstanding, ongoing. Check b12.      Relevant Orders   Vitamin B12   CKD (chronic kidney disease) stage 3, GFR 30-59 ml/min    Chronic, update renal panel when she returns for fasting labs.       Relevant Orders   Renal function panel   Dyslipidemia    Not truly fasting today. I've asked her to return at her convenience for  fasting labs.       Relevant Orders   Lipid panel   Irritable bowel syndrome   S/P colostomy takedown    Other Visit Diagnoses    Paresthesias       Relevant Orders   Vitamin B12       Follow up plan: Return if symptoms worsen or fail to improve.  [name], MD

## 2016-09-08 NOTE — Assessment & Plan Note (Signed)
Continue aspirin, RYR (statin intolerance)

## 2016-09-08 NOTE — Patient Instructions (Addendum)
Abdominal xray for abdominal pain.  Have a safe trip! Schedule fasting labs for when you return.

## 2016-09-08 NOTE — Progress Notes (Signed)
Pre visit review using our clinic review tool, if applicable. No additional management support is needed unless otherwise documented below in the visit note. 

## 2016-09-11 ENCOUNTER — Ambulatory Visit: Payer: Medicare Other | Admitting: Family Medicine

## 2016-09-20 ENCOUNTER — Other Ambulatory Visit
Admission: RE | Admit: 2016-09-20 | Discharge: 2016-09-20 | Disposition: A | Discharge status: Home / Self Care | Payer: Medicare Other | Source: Ambulatory Visit | Attending: Ophthalmology | Admitting: Ophthalmology

## 2016-09-20 ENCOUNTER — Other Ambulatory Visit (INDEPENDENT_AMBULATORY_CARE_PROVIDER_SITE_OTHER): Payer: Medicare Other

## 2016-09-20 DIAGNOSIS — H02402 Unspecified ptosis of left eyelid: Secondary | ICD-10-CM | POA: Insufficient documentation

## 2016-09-20 DIAGNOSIS — E785 Hyperlipidemia, unspecified: Secondary | ICD-10-CM

## 2016-09-20 DIAGNOSIS — R7989 Other specified abnormal findings of blood chemistry: Secondary | ICD-10-CM

## 2016-09-20 DIAGNOSIS — R5381 Other malaise: Secondary | ICD-10-CM | POA: Diagnosis not present

## 2016-09-20 DIAGNOSIS — R5382 Chronic fatigue, unspecified: Secondary | ICD-10-CM | POA: Diagnosis not present

## 2016-09-20 DIAGNOSIS — R202 Paresthesia of skin: Secondary | ICD-10-CM | POA: Diagnosis not present

## 2016-09-20 DIAGNOSIS — N183 Chronic kidney disease, stage 3 unspecified: Secondary | ICD-10-CM

## 2016-09-20 DIAGNOSIS — R51 Headache: Secondary | ICD-10-CM | POA: Diagnosis not present

## 2016-09-20 DIAGNOSIS — G245 Blepharospasm: Secondary | ICD-10-CM | POA: Diagnosis not present

## 2016-09-20 LAB — LIPID PANEL
Cholesterol: 240 mg/dL — ABNORMAL HIGH (ref 0–200)
HDL: 44.1 mg/dL (ref >39.00)
NonHDL: 195.6
Total CHOL/HDL Ratio: 5
Triglycerides: 224 mg/dL — ABNORMAL HIGH (ref 0.0–149.0)
VLDL: 44.8 mg/dL — ABNORMAL HIGH (ref 0.0–40.0)

## 2016-09-20 LAB — RENAL FUNCTION PANEL
Albumin: 4.1 g/dL (ref 3.5–5.2)
BUN: 18 mg/dL (ref 6–23)
CO2: 25 mEq/L (ref 19–32)
Calcium: 9.6 mg/dL (ref 8.4–10.5)
Chloride: 108 mEq/L (ref 96–112)
Creatinine, Ser: 0.99 mg/dL (ref 0.40–1.20)
GFR: 58.74 mL/min — ABNORMAL LOW (ref >60.00)
Glucose, Bld: 118 mg/dL — ABNORMAL HIGH (ref 70–99)
Phosphorus: 2.9 mg/dL (ref 2.3–4.6)
Potassium: 3.9 mEq/L (ref 3.5–5.1)
Sodium: 142 mEq/L (ref 135–145)

## 2016-09-20 LAB — LDL CHOLESTEROL, DIRECT: Direct LDL: 166 mg/dL

## 2016-09-20 LAB — VITAMIN B12: Vitamin B-12: 919 pg/mL — ABNORMAL HIGH (ref 211–911)

## 2016-09-21 ENCOUNTER — Other Ambulatory Visit: Payer: Self-pay

## 2016-09-21 MED ORDER — VALSARTAN 80 MG PO TABS: 80.0000 mg | ORAL_TABLET | Freq: Every day | ORAL | 3 refills | Status: DC

## 2016-09-21 NOTE — Telephone Encounter (Signed)
Requested Prescriptions   Signed Prescriptions Disp Refills  . valsartan (DIOVAN) 80 MG tablet 30 tablet 3    Sig: Take 1 tablet (80 mg total) by mouth daily.    Authorizing Provider: Minna Merritts    Ordering User: Janan Ridge

## 2016-09-25 DIAGNOSIS — H9209 Otalgia, unspecified ear: Secondary | ICD-10-CM | POA: Diagnosis not present

## 2016-09-25 DIAGNOSIS — M26609 Unspecified temporomandibular joint disorder, unspecified side: Secondary | ICD-10-CM | POA: Diagnosis not present

## 2016-09-25 DIAGNOSIS — R5381 Other malaise: Secondary | ICD-10-CM | POA: Diagnosis not present

## 2016-09-25 LAB — ACETYLCHOLINE RECEPTOR, BINDING: Acety choline binding ab: <0.03 nmol/L (ref 0.00–0.24)

## 2016-09-26 ENCOUNTER — Other Ambulatory Visit: Payer: Self-pay | Admitting: Ophthalmology

## 2016-09-26 DIAGNOSIS — G4489 Other headache syndrome: Secondary | ICD-10-CM

## 2016-09-26 DIAGNOSIS — H02402 Unspecified ptosis of left eyelid: Secondary | ICD-10-CM

## 2016-09-26 DIAGNOSIS — G245 Blepharospasm: Secondary | ICD-10-CM

## 2016-09-27 ENCOUNTER — Encounter: Payer: Self-pay | Admitting: Family Medicine

## 2016-09-27 ENCOUNTER — Ambulatory Visit (INDEPENDENT_AMBULATORY_CARE_PROVIDER_SITE_OTHER): Payer: Medicare Other | Admitting: Family Medicine

## 2016-09-27 VITALS — BP 124/62 | HR 92 | Temp 97.6°F | Wt 183.2 lb

## 2016-09-27 DIAGNOSIS — L304 Erythema intertrigo: Secondary | ICD-10-CM | POA: Diagnosis not present

## 2016-09-27 MED ORDER — NYSTATIN 100000 UNIT/GM EX POWD: Freq: Two times a day (BID) | CUTANEOUS | 0 refills | Status: DC | PRN

## 2016-09-27 MED ORDER — TERBINAFINE HCL 1 % EX CREA: 1.0000 "application " | TOPICAL_CREAM | Freq: Two times a day (BID) | CUTANEOUS | 0 refills | Status: DC

## 2016-09-27 NOTE — Progress Notes (Signed)
BP 124/62   Pulse 92   Temp 97.6 F (36.4 C) (Oral)   Wt 183 lb 4 oz (83.1 kg)   SpO2 95%   BMI 30.03 kg/m    CC: rash Subjective:    Patient ID: Ashley [name], female    DOB: 1945-09-18, 71 y.o.   MRN: [id number]  HPI: Ashley [name] is a 71 y.o. female presenting on 09/27/2016 for Rash   Rash on left lower abdomen over the last 3 weeks. At times blistering.  Burning discomfort. No pruritis.  Has been using topical cream once to twice twice daily - thinks lamisil.   Planned appt with [facility] chronic fatigue specialist for evaluation.   Relevant past medical, surgical, family and social history reviewed and updated as indicated. Interim medical history since our last visit reviewed. Allergies and medications reviewed and updated. Outpatient Medications Prior to Visit  Medication Sig Dispense Refill  . albuterol (PROVENTIL HFA;VENTOLIN HFA) 108 (90 Base) MCG/ACT inhaler Inhale 2 puffs into the lungs every 6 (six) hours as needed for wheezing or shortness of breath. 1 Inhaler 0  . alendronate (FOSAMAX) 70 MG tablet Take 70 mg by mouth once a week. Patient takes every Thursday    . ALPRAZolam (XANAX) 0.5 MG tablet Take 0.5 tablets (0.25 mg total) by mouth at bedtime as needed for sleep. 30 tablet 1  . AMBULATORY NON FORMULARY MEDICATION Medication Name:  1 squatty potty to use as needed 1 each 0  . Ascorbic Acid (VITAMIN C) 1000 MG tablet Take 1,000 mg by mouth daily.    Marland Kitchen aspirin EC 81 MG tablet Take 81 mg by mouth daily.    . Calcium Carbonate-Vitamin D (CALCIUM + D PO) Take 1 tablet by mouth daily.    . carvedilol (COREG) 6.25 MG tablet TAKE 1 TABLET (6.25 MG TOTAL) BY MOUTH 2 (TWO) TIMES DAILY WITH A MEAL. 180 tablet 3  . cholecalciferol (VITAMIN D) 1000 units tablet Take 2,000 Units by mouth daily.    Marland Kitchen dextromethorphan (DELSYM) 30 MG/5ML liquid Take 15 mg by mouth at bedtime as needed for cough.    . ezetimibe (ZETIA) 10 MG tablet TAKE 1 TABLET (10 MG TOTAL) BY MOUTH  DAILY. 30 tablet 6  . omeprazole (PRILOSEC) 40 MG capsule Take 1 capsule (40 mg total) by mouth daily.    . potassium chloride (K-DUR) 10 MEQ tablet Take 10 mEq by mouth as directed. Take daily as needed    . Pyridoxine HCl (VITAMIN B6 PO) Take 1 capsule by mouth daily. Reported on 07/12/2015    . Red Yeast Rice 600 MG CAPS Take 1 capsule (600 mg total) by mouth 2 (two) times daily.    Marland Kitchen spironolactone (ALDACTONE) 12.5 mg TABS tablet Take 12.5 mg by mouth daily.    Marland Kitchen torsemide (DEMADEX) 10 MG tablet Take 10 mg by mouth daily as needed (swelling/fluid).    . triamcinolone (NASACORT) 55 MCG/ACT AERO nasal inhaler Place 2 sprays into the nose 2 (two) times daily.    . valsartan (DIOVAN) 80 MG tablet Take 1 tablet (80 mg total) by mouth daily. 30 tablet 3  . vitamin E 100 UNIT capsule Take 100 Units by mouth daily.     No facility-administered medications prior to visit.      Per HPI unless specifically indicated in ROS section below Review of Systems     Objective:    BP 124/62   Pulse 92   Temp 97.6 F (36.4 C) (Oral)  Wt 183 lb 4 oz (83.1 kg)   SpO2 95%   BMI 30.03 kg/m   Wt Readings from Last 3 Encounters:  09/27/16 183 lb 4 oz (83.1 kg)  09/08/16 180 lb 12 oz (82 kg)  08/31/16 179 lb 8 oz (81.4 kg)    Physical Exam  Constitutional: She appears well-developed and well-nourished. No distress.  Skin: Skin is warm and dry. Rash noted. There is erythema.  Mildly erythematous macular rash at skin fold below abdominal pannus bilaterally L>R  Nursing note and vitals reviewed.  Results for orders placed or performed during the hospital encounter of 09/20/16  Acetylcholine receptor, binding  Result Value Ref Range   Acety choline binding ab <0.03 0.00 - 0.24 nmol/L      Assessment & Plan:   Problem List Items Addressed This Visit    Intertrigo - Primary    Was responding well to terbinafine - will refill.  Recommended nystatin powder PRN to prevent recurrence.            Follow up plan: Return if symptoms worsen or fail to improve.  [name], MD

## 2016-09-27 NOTE — Patient Instructions (Addendum)
I think you do have intertrigo. Treat with lamisi (terbinafine) cream and then use nystatin powder as needed.  Intertrigo Intertrigo is skin irritation or inflammation (dermatitis) that occurs when folds of skin rub together. The irritation can cause a rash and make skin raw and itchy. This condition most commonly occurs in the skin folds of these areas:  Toes.  Armpits.  Groin.  Belly.  Breasts.  Buttocks. Intertrigo is not passed from person to person (is not contagious). What are the causes? This condition is caused by heat, moisture, friction, and lack of air circulation. The condition can be made worse by:  Sweat.  Bacteria or a fungus, such as yeast. What increases the risk? This condition is more likely to occur if you have moisture in your skin folds. It is also more likely to develop in people who:  Have diabetes.  Are overweight.  Are on bed rest.  Live in a warm and moist climate.  Wear splints, braces, or other medical devices.  Are not able to control their bowels or bladder (have incontinence). What are the signs or symptoms? Symptoms of this condition include:  A pink or red skin rash.  Brown patches on the skin.  Raw or scaly skin.  Itchiness.  A burning feeling.  Bleeding.  Leaking fluid.  A bad smell. How is this diagnosed? This condition is diagnosed with a medical history and physical exam. You may also have a skin swab to test for bacteria or a fungus, such as yeast. How is this treated? Treatment may include:  Cleaning and drying your skin.  An oral antibiotic medicine or antibiotic skin cream for a bacterial infection.  Antifungal cream or pills for an infection that was caused by a fungus, such as yeast.  Steroid ointment to relieve itchiness and irritation. Follow these instructions at home:  Keep the affected area clean and dry.  Do not scratch your skin.  Stay in a cool environment as much as possible. Use an air  conditioner or fan, if available.  Apply over-the-counter and prescription medicines only as told by your health care provider.  If you were prescribed an antibiotic medicine, use it as told by your health care provider. Do not stop using the antibiotic even if your condition improves.  Keep all follow-up visits as told by your health care provider. This is important. How is this prevented?  Maintain a healthy weight.  Take care of your feet, especially if you have diabetes. Foot care includes:  Wearing shoes that fit well.  Keeping your feet dry.  Wearing clean, breathable socks.  Protect the skin around your groin and buttocks, especially if you have incontinence. Skin protection includes:  Following a regular cleaning routine.  Using moisturizers and skin protectants.  Changing protection pads frequently.  Do not wear tight clothes. Wear clothes that are loose and absorbent. Wear clothes that are made of cotton.  Wear a bra that gives good support, if needed.  Shower and dry yourself thoroughly after activity. Use a hair dryer on a cool setting to dry between skin folds, especially after you bathe.  If you have diabetes, keep your blood sugar under control. Contact a health care provider if:  Your symptoms do not improve with treatment.  Your symptoms get worse or they spread.  You notice increased redness and warmth.  You have a fever. This information is not intended to replace advice given to you by your health care provider. Make sure you discuss any  questions you have with your health care provider. Document Released: 05/01/2005 Document Revised: 10/07/2015 Document Reviewed: 11/02/2014 Elsevier Interactive Patient Education  2017 [employer].

## 2016-09-27 NOTE — Assessment & Plan Note (Signed)
Was responding well to terbinafine - will refill.  Recommended nystatin powder PRN to prevent recurrence.

## 2016-09-29 ENCOUNTER — Ambulatory Visit
Admission: RE | Admit: 2016-09-29 | Discharge: 2016-09-29 | Disposition: A | Discharge status: Home / Self Care | Payer: Medicare Other | Source: Ambulatory Visit | Attending: Ophthalmology | Admitting: Ophthalmology

## 2016-09-29 DIAGNOSIS — G245 Blepharospasm: Secondary | ICD-10-CM | POA: Diagnosis not present

## 2016-09-29 DIAGNOSIS — H02402 Unspecified ptosis of left eyelid: Secondary | ICD-10-CM | POA: Diagnosis not present

## 2016-09-29 DIAGNOSIS — R51 Headache: Secondary | ICD-10-CM | POA: Diagnosis not present

## 2016-09-29 DIAGNOSIS — G4489 Other headache syndrome: Secondary | ICD-10-CM

## 2016-09-29 MED ORDER — IOPAMIDOL (ISOVUE-300) INJECTION 61%
75.0000 mL | Freq: Once | INTRAVENOUS | Status: AC | PRN
  Administered 2016-09-29: 75 mL via INTRAVENOUS

## 2016-10-24 ENCOUNTER — Encounter: Payer: Self-pay | Admitting: Family Medicine

## 2016-10-24 ENCOUNTER — Ambulatory Visit (INDEPENDENT_AMBULATORY_CARE_PROVIDER_SITE_OTHER): Payer: Medicare Other | Admitting: Family Medicine

## 2016-10-24 VITALS — BP 104/74 | HR 77 | Temp 97.7°F | Wt 181.2 lb

## 2016-10-24 DIAGNOSIS — K582 Mixed irritable bowel syndrome: Secondary | ICD-10-CM

## 2016-10-24 DIAGNOSIS — I428 Other cardiomyopathies: Secondary | ICD-10-CM

## 2016-10-24 DIAGNOSIS — R5382 Chronic fatigue, unspecified: Secondary | ICD-10-CM | POA: Diagnosis not present

## 2016-10-24 DIAGNOSIS — R400 Somnolence: Secondary | ICD-10-CM | POA: Diagnosis not present

## 2016-10-24 DIAGNOSIS — D72829 Elevated white blood cell count, unspecified: Secondary | ICD-10-CM

## 2016-10-24 DIAGNOSIS — R5381 Other malaise: Secondary | ICD-10-CM

## 2016-10-24 DIAGNOSIS — R202 Paresthesia of skin: Secondary | ICD-10-CM | POA: Diagnosis not present

## 2016-10-24 DIAGNOSIS — M797 Fibromyalgia: Secondary | ICD-10-CM

## 2016-10-24 DIAGNOSIS — I420 Dilated cardiomyopathy: Secondary | ICD-10-CM | POA: Diagnosis not present

## 2016-10-24 DIAGNOSIS — N183 Chronic kidney disease, stage 3 unspecified: Secondary | ICD-10-CM

## 2016-10-24 DIAGNOSIS — G629 Polyneuropathy, unspecified: Secondary | ICD-10-CM | POA: Diagnosis not present

## 2016-10-24 DIAGNOSIS — R1084 Generalized abdominal pain: Secondary | ICD-10-CM | POA: Diagnosis not present

## 2016-10-24 LAB — CBC WITH DIFFERENTIAL/PLATELET
Basophils Absolute: 0.1 10*3/uL (ref 0.0–0.1)
Basophils Relative: 1.1 % (ref 0.0–3.0)
Eosinophils Absolute: 0.2 10*3/uL (ref 0.0–0.7)
Eosinophils Relative: 2.3 % (ref 0.0–5.0)
HCT: 38.7 % (ref 36.0–46.0)
Hemoglobin: 12.7 g/dL (ref 12.0–15.0)
Lymphocytes Relative: 25.2 % (ref 12.0–46.0)
Lymphs Abs: 2 10*3/uL (ref 0.7–4.0)
MCHC: 32.9 g/dL (ref 30.0–36.0)
MCV: 90.4 fl (ref 78.0–100.0)
Monocytes Absolute: 0.8 10*3/uL (ref 0.1–1.0)
Monocytes Relative: 10.1 % (ref 3.0–12.0)
Neutro Abs: 5 10*3/uL (ref 1.4–7.7)
Neutrophils Relative %: 61.3 % (ref 43.0–77.0)
Platelets: 266 10*3/uL (ref 150.0–400.0)
RBC: 4.28 Mil/uL (ref 3.87–5.11)
RDW: 13.2 % (ref 11.5–15.5)
WBC: 8.1 10*3/uL (ref 4.0–10.5)

## 2016-10-24 LAB — SEDIMENTATION RATE: Sed Rate: 16 mm/hr (ref 0–30)

## 2016-10-24 NOTE — Assessment & Plan Note (Signed)
Contributes to abd discomfort and symptoms.

## 2016-10-24 NOTE — Assessment & Plan Note (Signed)
Stable period, appreciate cards care.

## 2016-10-24 NOTE — Progress Notes (Signed)
BP 104/74   Pulse 77   Temp 97.7 F (36.5 C) (Oral)   Wt 181 lb 4 oz (82.2 kg)   SpO2 95%   BMI 29.70 kg/m    CC: ongoing fatigue, malaise Subjective:    Patient ID: Ashley [name], female    DOB: 02-16-1946, 71 y.o.   MRN: [id number]  HPI: Ashley [name] is a 71 y.o. female presenting on 10/24/2016 for Abdominal Pain   Requests referral to Dr Lattie Haw Criscione-Sehrelber at [facility] [phone]. Was recommended immunology referral by allergist Dr Margaretha Sheffield. Asks about IgA/G/M/Q/n.   Chronically fatigued - has to take 78mn-2 hour naps every afternoon, still sleeps well at night time (8-10 hours). Non restorative sleep. She does not regularly snore. Endorses PNdyspnea. No witnessed apneic episodes. Daytime somnolence. Sleep unsuccessful seep study 2010 - due to cramping in legs from electrodes "probes".   H/o sigmoid diverticulitis complicated by abscess and perforation s/p emergent colectomy 2015, with residual abd pain and altered bowel habits alternating diarrhea/constipation. Also h/o IBS. Last week bout of constipation. This morning hemorrhoid acting up. Treating with prep H. Ongoing lower abdominal pain, swelling, distension.  Ongoing joint pains predominantly knees.   Relevant past medical, surgical, family and social history reviewed and updated as indicated. Interim medical history since our last visit reviewed. Allergies and medications reviewed and updated. Outpatient Medications Prior to Visit  Medication Sig Dispense Refill  . albuterol (PROVENTIL HFA;VENTOLIN HFA) 108 (90 Base) MCG/ACT inhaler Inhale 2 puffs into the lungs every 6 (six) hours as needed for wheezing or shortness of breath. 1 Inhaler 0  . alendronate (FOSAMAX) 70 MG tablet Take 70 mg by mouth once a week. Patient takes every Thursday    . ALPRAZolam (XANAX) 0.5 MG tablet Take 0.5 tablets (0.25 mg total) by mouth at bedtime as needed for sleep. 30 tablet 1  . AMBULATORY NON FORMULARY MEDICATION  Medication Name:  1 squatty potty to use as needed 1 each 0  . Ascorbic Acid (VITAMIN C) 1000 MG tablet Take 1,000 mg by mouth daily.    .Marland Kitchenaspirin EC 81 MG tablet Take 81 mg by mouth daily.    . Calcium Carbonate-Vitamin D (CALCIUM + D PO) Take 1 tablet by mouth daily.    . carvedilol (COREG) 6.25 MG tablet TAKE 1 TABLET (6.25 MG TOTAL) BY MOUTH 2 (TWO) TIMES DAILY WITH A MEAL. 180 tablet 3  . cholecalciferol (VITAMIN D) 1000 units tablet Take 2,000 Units by mouth daily.    .Marland Kitchendextromethorphan (DELSYM) 30 MG/5ML liquid Take 15 mg by mouth at bedtime as needed for cough.    . ezetimibe (ZETIA) 10 MG tablet TAKE 1 TABLET (10 MG TOTAL) BY MOUTH DAILY. 30 tablet 6  . nystatin (MYCOSTATIN/NYSTOP) powder Apply topically 2 (two) times daily as needed. 30 g 0  . omeprazole (PRILOSEC) 40 MG capsule Take 1 capsule (40 mg total) by mouth daily.    . potassium chloride (K-DUR) 10 MEQ tablet Take 10 mEq by mouth as directed. Take daily as needed    . Pyridoxine HCl (VITAMIN B6 PO) Take 1 capsule by mouth daily. Reported on 07/12/2015    . Red Yeast Rice 600 MG CAPS Take 1 capsule (600 mg total) by mouth 2 (two) times daily.    .Marland Kitchenspironolactone (ALDACTONE) 12.5 mg TABS tablet Take 12.5 mg by mouth daily.    .Marland Kitchenterbinafine (LAMISIL) 1 % cream Apply 1 application topically 2 (two) times daily. 45 g 0  .  torsemide (DEMADEX) 10 MG tablet Take 10 mg by mouth daily as needed (swelling/fluid).    . triamcinolone (NASACORT) 55 MCG/ACT AERO nasal inhaler Place 2 sprays into the nose 2 (two) times daily.    . valsartan (DIOVAN) 80 MG tablet Take 1 tablet (80 mg total) by mouth daily. 30 tablet 3  . vitamin E 100 UNIT capsule Take 100 Units by mouth daily.     No facility-administered medications prior to visit.     Past Medical History:  Diagnosis Date  . Abdominal aortic atherosclerosis ([place]) 11/2015   by CT  . Allergy   . Anemia   . Anxiety   . Automatic implantable cardioverter-defibrillator in situ 2012   a.  MDT single lead ICD.  Marland Kitchen Bronchiectasis    a. possibly mild, treat URIs aggressively  . Cancer (HCC)    basal cell, arms & neck  . Chest pain    a. prev neg MV.  . Chronic sinusitis   . Chronic systolic CHF (congestive heart failure) (HCC)    a. EF 25-35%, LV dilation, significant MR, goal weight 150lbs;  b. 04/2014 Echo: EF 50-55%, Gr 1 DD.  Marland Kitchen Depression   . Diarrhea 07/14/2008   Qualifier: Diagnosis of  By: Glori Bickers MD, Carmell Austria   . Disorder of vocal cords   . Essential hypertension   . Fibrocystic breast disease   . Fibromyalgia   . GERD (gastroesophageal reflux disease)   . H/O multiple allergies 10/10/2013  . Hemorrhoids   . Hemorrhoids   . History of diverticulitis of colon   . History of pneumonia 2012  . Insomnia   . Irritable bowel syndrome   . Laryngeal nodule    Dr. Thomasena Edis  . Lung disease 07/14/11  . Migraine without aura, without mention of intractable migraine without mention of status migrainosus   . Mitral regurgitation   . Nonischemic cardiomyopathy ([place])    a. Recovered LV fxn - prev as low as 25%, now 50-55% by echo 04/2014;  b. 2012 s/p MDT [id number] [place] VR single lead ICD.  Marland Kitchen Osteoarthritis    knees, back, hands, low back,   . Osteoporosis 01/2013   T-2.7 spine (01/2016)  . Pneumonia 2011  . PONV (postoperative nausea and vomiting)   . Pre-syncope   . Pure hypercholesterolemia   . sigmoid diverticulitis with perforation, abscess and fistula 09/30/2013  . Sleep apnea 2010   borderline, inconclusive- /w [place]     Past Surgical History:  Procedure Laterality Date  . ABDOMINAL HYSTERECTOMY  1987   w/BSO  . ANAL RECTAL MANOMETRY N/A 02/02/2016   Procedure: ANO RECTAL MANOMETRY;  Surgeon: Mauri Pole, MD;  Location: [facility];  Service: Endoscopy;  Laterality: N/A;  . APPENDECTOMY    . AUGMENTATION MAMMAPLASTY    . BREAST BIOPSY     "I've had 2-3 bx; all benign"  . breast cystectomies     due to FCBD  . BREAST MASS EXCISION  01/2010    Left-fibrocystic change w/intraductal papilloma, no malignancy  . carotid US  16/1096   0-45% LICA, normal RICA  . [employer] Bunionectomy  11/04/08   Right Great Toe (Dr. Beola Cord)  . COLONOSCOPY  1998   nml (sigmoid-Gilbert-nol)  . Colonoscopy  01/04/04   polyps, diverticulosis/severe  . COLOSTOMY N/A 10/02/2013   DESCENDING COLOSTOMY;  Surgeon: Imogene Burn. Georgette Dover, [place] N/A 02/10/2014   [name], MD  . CT chest  12/2010   possible bronchiectasis lower  lobes, 02/2011 - enlarged bilateral effusions, bibasilar atx  . CT chest  04/2011   no PE.  abnormal R hilar and mediastinal adenopathy --> on rpt 05/2011, resolved  . DEXA  03/28/99   osteopenia L/S  . DEXA  03/12/03   2.2 Spine 0.1 Hip  Osteopenia  . DEXA  11/14/05   1.9 Spine 0.3 Hip  slight improvement  . DEXA  11/22/06   improved osteo and fem neck  . DEXA  01/2013   T score -2.6 at spine  . EMG/MCV  10/16/01   + mild carpal tunnel  . ESOPHAGOGASTRODUODENOSCOPY  09/2014   small HH, irregular Z line, fundic gland polyps with mild gastritis ([place])  . Flex laryngoscopy  06/11/06   ([place]) nml  . Head MRI, Head CT  10/1998   ? H/A focus ([place])  . ICD placement  07/14/11   w/pacemaker  . IMPLANTABLE CARDIOVERTER DEFIBRILLATOR IMPLANT N/A 07/14/2011   IMPLANTABLE CARDIOVERTER DEFIBRILLATOR IMPLANT;  Surgeon: Evans Lance, MD;  Location: [facility] CATH LAB;  Service: Cardiovascular;  Laterality: N/A;  . KNEE ARTHROSCOPY W/ ORIF     Left; "meniscus tear"  . KNEE SURGERY     left  . L/S films  11/21/01   nml; Right hip nml  . LASIK     left eye  . MANDIBLE SURGERY     "got about 6 pins in the bottom of my jaw"  . MANDIBLE SURGERY    . MRI Right Hip  12/12/01   Tendonitis Gluteus Medius to Gtr Trochanter Neg Bursitis (Dr. Eulas Post)  . MRI U/S  11/21/01   min. DDD L/3-4  . NCS  11/2014   L ulnar neuropathy, mild B median nerve entrapment ([place])  . OSTEOTOMY     Left foot  . PARTIAL COLECTOMY N/A 10/02/2013   PARTIAL  COLECTOMY;  Surgeon: Imogene Burn. Tsuei, MD  . sinus surgery     x3 with balloon   . stress myoview  02/2011   no significant ischemia  . TOE AMPUTATION     "toe beside baby toe on left foot; got gangrene from corn"  . TONSILLECTOMY AND ADENOIDECTOMY    . US ECHOCARDIOGRAPHY  04/2011   mildly dilated LV, EF 25-30%, hypokinesis, restrictive physiology, mod MR, no AS    Family History  Problem Relation Age of Onset  . Lung cancer Father        + smoker  . Colon polyps Father   . Dementia Mother   . Osteoporosis Mother        Lumbar spine  . Stroke Mother        x 5 @ 68 YOA  . Arthritis Mother        hands  . Emphysema Mother   . Alcohol abuse Paternal Grandfather   . Stroke Paternal Grandfather        ?  Marland Kitchen Heart disease Paternal Grandfather   . Hypertension Maternal Grandfather        ?  Marland Kitchen Heart disease Paternal Grandmother   . Colon cancer Neg Hx   . Esophageal cancer Neg Hx   . Gallbladder disease Neg Hx     Social History  Substance Use Topics  . Smoking status: Never Smoker  . Smokeless tobacco: Never Used     Comment: Lived with smokers x 23 yrs, smoked herself "for a week"  . Alcohol use No    Per HPI unless specifically indicated in ROS section below Review of Systems  Objective:    BP 104/74   Pulse 77   Temp 97.7 F (36.5 C) (Oral)   Wt 181 lb 4 oz (82.2 kg)   SpO2 95%   BMI 29.70 kg/m   Wt Readings from Last 3 Encounters:  10/24/16 181 lb 4 oz (82.2 kg)  09/27/16 183 lb 4 oz (83.1 kg)  09/08/16 180 lb 12 oz (82 kg)    Physical Exam  Constitutional: She appears well-developed and well-nourished. No distress.  HENT:  Head: Normocephalic and atraumatic.  Mouth/Throat: Oropharynx is clear and moist. No oropharyngeal exudate.  Eyes: Conjunctivae and EOM are normal. Pupils are equal, round, and reactive to light.  Cardiovascular: Normal rate, regular rhythm, normal heart sounds and intact distal pulses.   No murmur heard. Pulmonary/Chest:  Effort normal and breath sounds normal. No respiratory distress. She has no wheezes. She has no rales.  Abdominal: Soft. Normal appearance and bowel sounds are normal. She exhibits distension. She exhibits no mass. There is no hepatosplenomegaly. There is generalized tenderness. There is no rigidity, no rebound, no guarding, no CVA tenderness and negative Murphy's sign. A hernia (several) is present.  Musculoskeletal: She exhibits edema (tr).  Skin: Skin is warm and dry. No rash noted.  Psychiatric: She has a normal mood and affect.  Nursing note and vitals reviewed.  Results for orders placed or performed in visit on 10/24/16  CBC with Differential/Platelet  Result Value Ref Range   WBC 8.1 4.0 - 10.5 K/uL   RBC 4.28 3.87 - 5.11 Mil/uL   Hemoglobin 12.7 12.0 - 15.0 g/dL   HCT 38.7 36.0 - 46.0 %   MCV 90.4 78.0 - 100.0 fl   MCHC 32.9 30.0 - 36.0 g/dL   RDW 13.2 11.5 - 15.5 %   Platelets 266.0 150.0 - 400.0 K/uL   Neutrophils Relative % 61.3 43.0 - 77.0 %   Lymphocytes Relative 25.2 12.0 - 46.0 %   Monocytes Relative 10.1 3.0 - 12.0 %   Eosinophils Relative 2.3 0.0 - 5.0 %   Basophils Relative 1.1 0.0 - 3.0 %   Neutro Abs 5.0 1.4 - 7.7 K/uL   Lymphs Abs 2.0 0.7 - 4.0 K/uL   Monocytes Absolute 0.8 0.1 - 1.0 K/uL   Eosinophils Absolute 0.2 0.0 - 0.7 K/uL   Basophils Absolute 0.1 0.0 - 0.1 K/uL  Sedimentation rate  Result Value Ref Range   Sed Rate 16 0 - 30 mm/hr      Assessment & Plan:   Problem List Items Addressed This Visit    Abdominal pain    Discussed with patient anticipated chronic course after multiple abdominal surgeries including colectomy.       Cardiomyopathy, dilated, nonischemic (HCC)    Stable period. Continue f/u with cards.      Chronic fatigue and malaise - Primary    Longstanding, ongoing fatigue and malaise. Discussed chronic conditions and fibromyalgia contribute to fatigue.  Has previously seen Dr Amil Amen rheumatologist with unrevealing evaluation  (normal AHA, direct and IFA, normal ESR/CRP).  Will update ESR, CBC.  Will refer to chronic fatigue specialist per pt request and ENT request (Dr Kathyrn Sheriff) Endorses increased daytime somnolence and non restorative sleep, ESS today = 15. Will refer to pulm to consider sleep evaluation. Pt agrees with plan.       Relevant Orders   Sedimentation rate (Completed)   Ambulatory referral to Pulmonology   Ambulatory referral to Immunology   CKD (chronic kidney disease) stage 3, GFR 30-59 ml/min  Update labs including CBC, SPEP.       Relevant Orders   CBC with Differential/Platelet (Completed)   Serum protein electrophoresis with reflex   Fibromyalgia   Irritable bowel syndrome    Contributes to abd discomfort and symptoms.       Neuropathy    Ongoing BLE paresthesias s/p neurology eval      Nonischemic cardiomyopathy ([place])    Stable period, appreciate cards care.       Other Visit Diagnoses    Leukocytosis, unspecified type       Relevant Orders   CBC with Differential/Platelet (Completed)   Pathologist smear review   Daytime somnolence       Relevant Orders   Ambulatory referral to Pulmonology       Follow up plan: Return in about 3 months (around 01/24/2017) for follow up visit.  [name], MD

## 2016-10-24 NOTE — Assessment & Plan Note (Signed)
Stable period. Continue f/u with cards.

## 2016-10-24 NOTE — Assessment & Plan Note (Signed)
Update labs including CBC, SPEP.

## 2016-10-24 NOTE — Assessment & Plan Note (Signed)
Discussed with patient anticipated chronic course after multiple abdominal surgeries including colectomy.

## 2016-10-24 NOTE — Assessment & Plan Note (Signed)
Ongoing BLE paresthesias s/p neurology eval

## 2016-10-24 NOTE — Assessment & Plan Note (Addendum)
Longstanding, ongoing fatigue and malaise. Discussed chronic conditions and fibromyalgia contribute to fatigue.  Has previously seen Dr Amil Amen rheumatologist with unrevealing evaluation (normal AHA, direct and IFA, normal ESR/CRP).  Will update ESR, CBC.  Will refer to chronic fatigue specialist per pt request and ENT request (Dr Kathyrn Sheriff) Endorses increased daytime somnolence and non restorative sleep, ESS today = 15. Will refer to pulm to consider sleep evaluation. Pt agrees with plan.

## 2016-10-24 NOTE — Patient Instructions (Addendum)
Labs today.  We will refer you for sleep study.  We will refer you to Dr Alonna [facility]ler at [facility] for evaluation for chronic fatigue.

## 2016-10-25 LAB — PATHOLOGIST SMEAR REVIEW

## 2016-10-26 LAB — PROTEIN ELECTROPHORESIS, SERUM, WITH REFLEX
Albumin ELP: 4.3 g/dL (ref 3.8–4.8)
Alpha-1-Globulin: 0.3 g/dL (ref 0.2–0.3)
Alpha-2-Globulin: 0.9 g/dL (ref 0.5–0.9)
Beta 2: 0.7 g/dL — ABNORMAL HIGH (ref 0.2–0.5)
Beta Globulin: 0.7 g/dL — ABNORMAL HIGH (ref 0.4–0.6)
Gamma Globulin: 0.8 g/dL (ref 0.8–1.7)
Total Protein, Serum Electrophoresis: 7.6 g/dL (ref 6.1–8.1)

## 2016-10-26 LAB — IFE INTERPRETATION

## 2016-10-27 ENCOUNTER — Emergency Department (HOSPITAL_COMMUNITY)
Admission: EM | Admit: 2016-10-27 | Discharge: 2016-10-27 | Disposition: A | Discharge status: Home / Self Care | Payer: Medicare Other | Attending: Emergency Medicine | Admitting: Emergency Medicine

## 2016-10-27 ENCOUNTER — Encounter (HOSPITAL_COMMUNITY): Payer: Self-pay

## 2016-10-27 ENCOUNTER — Telehealth: Payer: Self-pay

## 2016-10-27 DIAGNOSIS — K922 Gastrointestinal hemorrhage, unspecified: Secondary | ICD-10-CM | POA: Diagnosis present

## 2016-10-27 DIAGNOSIS — Z885 Allergy status to narcotic agent status: Secondary | ICD-10-CM | POA: Insufficient documentation

## 2016-10-27 DIAGNOSIS — Z9049 Acquired absence of other specified parts of digestive tract: Secondary | ICD-10-CM | POA: Insufficient documentation

## 2016-10-27 DIAGNOSIS — K649 Unspecified hemorrhoids: Secondary | ICD-10-CM | POA: Insufficient documentation

## 2016-10-27 DIAGNOSIS — I5022 Chronic systolic (congestive) heart failure: Secondary | ICD-10-CM | POA: Diagnosis not present

## 2016-10-27 DIAGNOSIS — Z880 Allergy status to penicillin: Secondary | ICD-10-CM | POA: Insufficient documentation

## 2016-10-27 DIAGNOSIS — Z91040 Latex allergy status: Secondary | ICD-10-CM | POA: Diagnosis not present

## 2016-10-27 DIAGNOSIS — I110 Hypertensive heart disease with heart failure: Secondary | ICD-10-CM | POA: Insufficient documentation

## 2016-10-27 LAB — COMPREHENSIVE METABOLIC PANEL
ALT: 27 U/L (ref 14–54)
AST: 24 U/L (ref 15–41)
Albumin: 4.1 g/dL (ref 3.5–5.0)
Alkaline Phosphatase: 89 U/L (ref 38–126)
Anion gap: 9 (ref 5–15)
BUN: 18 mg/dL (ref 6–20)
CO2: 23 mmol/L (ref 22–32)
Calcium: 9.7 mg/dL (ref 8.9–10.3)
Chloride: 106 mmol/L (ref 101–111)
Creatinine, Ser: 1.25 mg/dL — ABNORMAL HIGH (ref 0.44–1.00)
GFR calc Af Amer: 49 mL/min — ABNORMAL LOW (ref >60)
GFR calc non Af Amer: 42 mL/min — ABNORMAL LOW (ref >60)
Glucose, Bld: 106 mg/dL — ABNORMAL HIGH (ref 65–99)
Potassium: 4.1 mmol/L (ref 3.5–5.1)
Sodium: 138 mmol/L (ref 135–145)
Total Bilirubin: 0.4 mg/dL (ref 0.3–1.2)
Total Protein: 7.5 g/dL (ref 6.5–8.1)

## 2016-10-27 LAB — CBC
HCT: 37.8 % (ref 36.0–46.0)
Hemoglobin: 12.1 g/dL (ref 12.0–15.0)
MCH: 29.4 pg (ref 26.0–34.0)
MCHC: 32 g/dL (ref 30.0–36.0)
MCV: 91.7 fL (ref 78.0–100.0)
Platelets: 276 10*3/uL (ref 150–400)
RBC: 4.12 MIL/uL (ref 3.87–5.11)
RDW: 13.1 % (ref 11.5–15.5)
WBC: 10.3 10*3/uL (ref 4.0–10.5)

## 2016-10-27 LAB — TYPE AND SCREEN
ABO/RH(D): O POS
Antibody Screen: NEGATIVE

## 2016-10-27 LAB — ABO/RH: ABO/RH(D): O POS

## 2016-10-27 MED ORDER — LIDOCAINE-EPINEPHRINE (PF) 2 %-1:200000 IJ SOLN
20.0000 mL | Freq: Once | INTRAMUSCULAR | Status: DC
  Filled 2016-10-27: qty 20

## 2016-10-27 NOTE — Discharge Instructions (Signed)
4 x a day and after bowel movements.  Take dressing off tomorrow.  Return for worsening bleeding.

## 2016-10-27 NOTE — ED Triage Notes (Signed)
Pt endorses rectal bleeding that began this evening. Pt has soiled 1 pad. Denies weakness/dizziness. Also endorses burning on the left side of the stomach. Pt had 6 inches of colon removed 3 years ago. VSS.

## 2016-10-27 NOTE — Telephone Encounter (Signed)
Per chart review tab pt is at [place]. 

## 2016-10-27 NOTE — Telephone Encounter (Signed)
PLEASE NOTE: All timestamps contained within this report are represented as [place] Standard Time. CONFIDENTIALTY NOTICE: This fax transmission is intended only for the addressee. It contains information that is legally privileged, confidential or otherwise protected from use or disclosure. If you are not the intended recipient, you are strictly prohibited from reviewing, disclosing, copying using or disseminating any of this information or taking any action in reliance on or regarding this information. If you have received this fax in error, please notify us immediately by telephone so that we can arrange for its return to [place]. Phone: [phone], Toll-Free: [phone], Fax: [phone] Page: 1 of 1 Call Id: [id number] [name] Patient Name: Ashley [name] Gender: Female DOB: 1946-04-26 Age: 71 Y 3 M 9 D Return Phone Number: [phone] (Primary) City/State/Zip: [place] Client [place] [facility] - Client Client Site [facility] Physician [name] - MD Who Is Calling Patient / Member / Family / Caregiver Call Type Triage / Clinical Relationship To Patient Self Return Phone Number [phone] (Primary) Chief Complaint Rectal Bleeding Reason for Call Symptomatic / Request for [name] states she has rectal bleeding and she is on blood thinners. Nurse Assessment Nurse: Brock Bad RN, Barnetta Chapel Date/Time (Eastern Time): 10/27/2016 12:07:32 AM Confirm and document reason for call. If symptomatic, describe symptoms. ---Caller states that she had a pacemaker placed in 2012. She also has a history of colectomy. She is currently on a baby aspirin. She has chronic issues with her bowels including constipation and diarrhea. Her BM 4-5 days ago looked like a hemorrhoid "popped out." This afternoon, there was blood  on her underwear and also a pad and towel. She has seen clots come out of her rectum as well. Does the PT have any chronic conditions? (i.e. diabetes, asthma, etc.) ---Yes List chronic conditions. ---pacemaker, hx colectomy, chronic fatigue, sleep apnea, hypertension, decreased ejection fracture Guidelines Guideline Title Affirmed Question Rectal Bleeding [1] MODERATE rectal bleeding (small blood clots, passing blood without stool, or toilet water turns red) AND [2] more than once a day Disp. Time [name] Time) Disposition Final User 10/27/2016 12:19:04 AM Go to ED Now Yes Brock Bad, RN, [facility] - ED Care Advice Given Per Guideline GO TO ED NOW: You need to be seen in the Emergency Department. Go to the ER at ___________ [place] now. Drive carefully. DRIVING: Another adult should drive. Do not delay going to the Emergency Department. If immediate transportation is not available via car or taxi, then the patient should be instructed to call [id number]. BRING MEDICINES: * Please bring a list of your current medicines when you go to the Emergency Department (ER). CARE ADVICE given per Rectal Bleeding (Adult) guideline.

## 2016-10-27 NOTE — ED Provider Notes (Signed)
[facility] DEPT Provider Note   CSN: [id number] Arrival date & time: 10/27/16  0100  By signing my name below, I, Ashley [name], attest that this documentation has been prepared under the direction and in the presence of Deno Etienne, DO. Electronically Signed: Theresia [name], ED Scribe. 10/27/16. 3:33 AM.  History   Chief Complaint Chief Complaint  Patient presents with  . GI Bleeding   The history is provided by the patient. No language interpreter was used.   HPI Comments: Ashley [name] is a 71 y.o. female with a PMHx of partial colectomy, who presents to the Emergency Department complaining of sudden onset, moderate rectal bleeding onset this afternoon. Per pt, she started bleeding from her rectum 8 hours ago and has been gradually increasing. She notes there have been clotts of blood. Pt states she has a hx of abnormal GI symptoms. Pt reports associated constipation (past couple of days), loose stools (this morning) headache and fatigue. Pt states she has not taken her normal dose of ASA tonight. No other complaints at this time.  Past Medical History:  Diagnosis Date  . Abdominal aortic atherosclerosis ([place]) 11/2015   by CT  . Allergy   . Anemia   . Anxiety   . Automatic implantable cardioverter-defibrillator in situ 2012   a. MDT single lead ICD.  Marland Kitchen Bronchiectasis    a. possibly mild, treat URIs aggressively  . Cancer (HCC)    basal cell, arms & neck  . Chest pain    a. prev neg MV.  . Chronic sinusitis   . Chronic systolic CHF (congestive heart failure) (HCC)    a. EF 25-35%, LV dilation, significant MR, goal weight 150lbs;  b. 04/2014 Echo: EF 50-55%, Gr 1 DD.  Marland Kitchen Depression   . Diarrhea 07/14/2008   Qualifier: Diagnosis of  By: Glori Bickers MD, Carmell Austria   . Disorder of vocal cords   . Essential hypertension   . Fibrocystic breast disease   . Fibromyalgia   . GERD (gastroesophageal reflux disease)   . H/O multiple allergies 10/10/2013  . Hemorrhoids   .  Hemorrhoids   . History of diverticulitis of colon   . History of pneumonia 2012  . Insomnia   . Irritable bowel syndrome   . Laryngeal nodule    Dr. Thomasena Edis  . Lung disease 07/14/11  . Migraine without aura, without mention of intractable migraine without mention of status migrainosus   . Mitral regurgitation   . Nonischemic cardiomyopathy ([place])    a. Recovered LV fxn - prev as low as 25%, now 50-55% by echo 04/2014;  b. 2012 s/p MDT [id number] [place] VR single lead ICD.  Marland Kitchen Osteoarthritis    knees, back, hands, low back,   . Osteoporosis 01/2013   T-2.7 spine (01/2016)  . Pneumonia 2011  . PONV (postoperative nausea and vomiting)   . Pre-syncope   . Pure hypercholesterolemia   . sigmoid diverticulitis with perforation, abscess and fistula 09/30/2013  . Sleep apnea 2010   borderline, inconclusive- /w [place]     Patient Active Problem List   Diagnosis Date Noted  . LBBB (left bundle branch block) 07/11/2016  . Gastritis 06/12/2016  . Bladder wall thickening 02/16/2016  . Fecal incontinence   . Abdominal pain 11/22/2015  . History of diverticular abscess 11/22/2015  . Abdominal aortic atherosclerosis ([place]) 11/13/2015  . 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  . Hoarseness 06/21/2015  . Hypersensitivity to pneumococcal vaccine 06/10/2015  . Medicare annual wellness visit, subsequent 06/08/2015  .  Advanced care planning/counseling discussion 06/08/2015  . Intertrigo 04/06/2015  . Chronic daily headache 04/06/2015  . Neuropathy 04/06/2015  . Essential hypertension   . Nonischemic cardiomyopathy ([place])   . Chest pain and shortness of breath 12/25/2014  . S/P colostomy takedown 02/10/2014  . ICD (implantable cardioverter-defibrillator) in place 10/23/2013  . H/O multiple allergies 10/10/2013  . Sigmoid diverticulitis with abscess, perforation and fistula formation 09/25/2013  . Osteoporosis 01/13/2013  . Irregular heart beat 08/06/2012  . Depression 07/09/2012  . Chronic recurrent  sinusitis 06/17/2012  . Vitamin D deficiency 03/05/2012  . CKD (chronic kidney disease) stage 3, GFR 30-59 ml/min 11/09/2011  . Epistaxis, recurrent 10/11/2011  . Orthostatic hypotension 04/30/2011  . Cardiomyopathy, dilated, nonischemic ([place]) 03/01/2011  . Mitral regurgitation 02/24/2011  . Chronic systolic CHF (congestive heart failure) ([place]) 02/24/2011  . Cough 01/20/2011  . Chronic fatigue and malaise 11/02/2010  . Anxiety state 08/18/2010  . 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  . Dyslipidemia 11/22/2006  . COMMON MIGRAINE 11/22/2006  . DIVERTICULOSIS, COLON 11/22/2006  . Irritable bowel syndrome 11/22/2006  . FIBROCYSTIC BREAST DISEASE 11/22/2006  . Osteoarthritis 11/22/2006  . Fibromyalgia 11/22/2006    Past Surgical History:  Procedure Laterality Date  . ABDOMINAL HYSTERECTOMY  1987   w/BSO  . ANAL RECTAL MANOMETRY N/A 02/02/2016   Procedure: ANO RECTAL MANOMETRY;  Surgeon: Mauri Pole, MD;  Location: [facility];  Service: Endoscopy;  Laterality: N/A;  . APPENDECTOMY    . AUGMENTATION MAMMAPLASTY    . BREAST BIOPSY     "I've had 2-3 bx; all benign"  . breast cystectomies     due to FCBD  . BREAST MASS EXCISION  01/2010   Left-fibrocystic change w/intraductal papilloma, no malignancy  . carotid US  84/6659   9-35% LICA, normal RICA  . [place] Bunionectomy  11/04/08   Right Great Toe (Dr. Beola Cord)  . COLONOSCOPY  1998   nml (sigmoid-Gilbert-nol)  . Colonoscopy  01/04/04   polyps, diverticulosis/severe  . COLOSTOMY N/A 10/02/2013   DESCENDING COLOSTOMY;  Surgeon: Imogene Burn. Tsuei, MD  . Derrill Memo REVERSAL N/A 02/10/2014   [name], MD  . CT chest  12/2010   possible bronchiectasis lower lobes, 02/2011 - enlarged bilateral effusions, bibasilar atx  . CT chest  04/2011   no PE.  abnormal R hilar and mediastinal adenopathy --> on rpt 05/2011, resolved  . DEXA  03/28/99   osteopenia L/S  . DEXA  03/12/03   2.2 Spine 0.1 Hip  Osteopenia  . DEXA  11/14/05    1.9 Spine 0.3 Hip  slight improvement  . DEXA  11/22/06   improved osteo and fem neck  . DEXA  01/2013   T score -2.6 at spine  . EMG/MCV  10/16/01   + mild carpal tunnel  . ESOPHAGOGASTRODUODENOSCOPY  09/2014   small HH, irregular Z line, fundic gland polyps with mild gastritis ([place])  . Flex laryngoscopy  06/11/06   ([place]) nml  . Head MRI, Head CT  10/1998   ? H/A focus ([place])  . ICD placement  07/14/11   w/pacemaker  . IMPLANTABLE CARDIOVERTER DEFIBRILLATOR IMPLANT N/A 07/14/2011   IMPLANTABLE CARDIOVERTER DEFIBRILLATOR IMPLANT;  Surgeon: Evans Lance, MD;  Location: [facility] CATH LAB;  Service: Cardiovascular;  Laterality: N/A;  . KNEE ARTHROSCOPY W/ ORIF     Left; "meniscus tear"  . KNEE SURGERY     left  . L/S films  11/21/01   nml; Right hip nml  .  LASIK     left eye  . MANDIBLE SURGERY     "got about 6 pins in the bottom of my jaw"  . MANDIBLE SURGERY    . MRI Right Hip  12/12/01   Tendonitis Gluteus Medius to Gtr Trochanter Neg Bursitis (Dr. Eulas Post)  . MRI U/S  11/21/01   min. DDD L/3-4  . NCS  11/2014   L ulnar neuropathy, mild B median nerve entrapment ([place])  . OSTEOTOMY     Left foot  . PARTIAL COLECTOMY N/A 10/02/2013   PARTIAL COLECTOMY;  Surgeon: Imogene Burn. Tsuei, MD  . sinus surgery     x3 with balloon   . stress myoview  02/2011   no significant ischemia  . TOE AMPUTATION     "toe beside baby toe on left foot; got gangrene from corn"  . TONSILLECTOMY AND ADENOIDECTOMY    . US ECHOCARDIOGRAPHY  04/2011   mildly dilated LV, EF 25-30%, hypokinesis, restrictive physiology, mod MR, no AS    OB History    No data available       Home Medications    Prior to Admission medications   Medication Sig Start Date End Date Taking? Authorizing Provider  albuterol (PROVENTIL HFA;VENTOLIN HFA) 108 (90 Base) MCG/ACT inhaler Inhale 2 puffs into the lungs every 6 (six) hours as needed for wheezing or shortness of breath. 07/25/16   Jearld Fenton, NP  alendronate  (FOSAMAX) 70 MG tablet Take 70 mg by mouth once a week. Patient takes every Thursday    [provider]  ALPRAZolam Duanne Moron) 0.5 MG tablet Take 0.5 tablets (0.25 mg total) by mouth at bedtime as needed for sleep. 09/08/16   [name], MD  AMBULATORY NON FORMULARY MEDICATION Medication Name:  1 squatty potty to use as needed 01/20/16   Mauri Pole, MD  Ascorbic Acid (VITAMIN C) 1000 MG tablet Take 1,000 mg by mouth daily.    [provider]  aspirin EC 81 MG tablet Take 81 mg by mouth daily.    [provider]  Calcium Carbonate-Vitamin D (CALCIUM + D PO) Take 1 tablet by mouth daily.    [provider]  carvedilol (COREG) 6.25 MG tablet TAKE 1 TABLET (6.25 MG TOTAL) BY MOUTH 2 (TWO) TIMES DAILY WITH A MEAL. 04/11/16   Minna Merritts, MD  cholecalciferol (VITAMIN D) 1000 units tablet Take 2,000 Units by mouth daily.    [provider]  dextromethorphan (DELSYM) 30 MG/5ML liquid Take 15 mg by mouth at bedtime as needed for cough.    [provider]  ezetimibe (ZETIA) 10 MG tablet TAKE 1 TABLET (10 MG TOTAL) BY MOUTH DAILY. 04/10/16   [name], MD  nystatin (MYCOSTATIN/NYSTOP) powder Apply topically 2 (two) times daily as needed. 09/27/16   [name], MD  omeprazole (PRILOSEC) 40 MG capsule Take 1 capsule (40 mg total) by mouth daily. 05/23/16   [name], MD  potassium chloride (K-DUR) 10 MEQ tablet Take 10 mEq by mouth as directed. Take daily as needed    [provider]  Pyridoxine HCl (VITAMIN B6 PO) Take 1 capsule by mouth daily. Reported on 07/12/2015    [provider]  Red Yeast Rice 600 MG CAPS Take 1 capsule (600 mg total) by mouth 2 (two) times daily. 06/12/16   [name], MD  spironolactone (ALDACTONE) 12.5 mg TABS tablet Take 12.5 mg by mouth daily.    [provider]  terbinafine (LAMISIL) 1 % cream Apply  1 application topically 2 (two) times daily. 09/27/16    [name], MD  torsemide (DEMADEX) 10 MG tablet Take 10 mg by mouth daily as needed (swelling/fluid).    [provider]  triamcinolone (NASACORT) 55 MCG/ACT AERO nasal inhaler Place 2 sprays into the nose 2 (two) times daily.    [provider]  valsartan (DIOVAN) 80 MG tablet Take 1 tablet (80 mg total) by mouth daily. 09/21/16   Minna Merritts, MD  vitamin E 100 UNIT capsule Take 100 Units by mouth daily.    [provider]    Family History Family History  Problem Relation Age of Onset  . Lung cancer Father        + smoker  . Colon polyps Father   . Dementia Mother   . Osteoporosis Mother        Lumbar spine  . Stroke Mother        x 5 @ 75 YOA  . Arthritis Mother        hands  . Emphysema Mother   . Alcohol abuse Paternal Grandfather   . Stroke Paternal Grandfather        ?  Marland Kitchen Heart disease Paternal Grandfather   . Hypertension Maternal Grandfather        ?  Marland Kitchen Heart disease Paternal Grandmother   . Colon cancer Neg Hx   . Esophageal cancer Neg Hx   . Gallbladder disease Neg Hx     Social History Social History  Substance Use Topics  . Smoking status: Never Smoker  . Smokeless tobacco: Never Used     Comment: Lived with smokers x 23 yrs, smoked herself "for a week"  . Alcohol use No     Allergies   Antihistamines, chlorpheniramine-type; Codeine; Cyclobenzaprine; Cymbalta [duloxetine hcl]; Influenza vac split [flu virus vaccine]; Prevnar [pneumococcal 13-val conj vacc]; Sulfasalazine; Atorvastatin; Chlorhexidine; Dilaudid [hydromorphone hcl]; Latex; Morphine and related; Pravastatin; Prevacid [lansoprazole]; Red dye; Rosuvastatin; Simvastatin; Other; Penicillins; Tape; and Metaxalone   Review of Systems Review of Systems  Constitutional: Positive for fatigue. Negative for chills and fever.  HENT: Negative for congestion and rhinorrhea.   Eyes: Negative for redness and visual disturbance.  Respiratory: Negative for shortness  of breath and wheezing.   Cardiovascular: Negative for chest pain and palpitations.  Gastrointestinal: Positive for anal bleeding, blood in stool, constipation and diarrhea. Negative for nausea and vomiting.  Genitourinary: Negative for dysuria and urgency.  Musculoskeletal: Negative for arthralgias and myalgias.  Skin: Negative for pallor and wound.  Neurological: Positive for headaches. Negative for dizziness.     Physical Exam Updated Vital Signs BP (!) 135/59   Pulse (!) 37   Temp 98.1 F (36.7 C) (Oral)   Resp 13   Ht 5' 5.5" (1.664 m)   Wt 82.6 kg (182 lb)   SpO2 99%   BMI 29.83 kg/m   Physical Exam  Constitutional: She is oriented to person, place, and time. She appears well-developed and well-nourished. No distress.  HENT:  Head: Normocephalic and atraumatic.  Eyes: EOM are normal. Pupils are equal, round, and reactive to light.  Neck: Normal range of motion. Neck supple.  Cardiovascular: Normal rate and regular rhythm.  Exam reveals no gallop and no friction rub.   No murmur heard. Pulmonary/Chest: Effort normal. She has no wheezes. She has no rales.  Abdominal: Soft. She exhibits no distension. There is no tenderness.  Genitourinary:  Genitourinary Comments: Exam performed with chaperone present. Hemorrhoid that that has friable  areas for risk of bleeding located at the 5 O'clock position.   Musculoskeletal: She exhibits no edema or tenderness.  Neurological: She is alert and oriented to person, place, and time.  Skin: Skin is warm and dry. She is not diaphoretic.  Psychiatric: She has a normal mood and affect. Her behavior is normal.  Nursing note and vitals reviewed.    ED Treatments / Results  DIAGNOSTIC STUDIES: Oxygen Saturation is 95% on RA, adequate by my interpretation.   COORDINATION OF CARE: 2:56 AM-Discussed next steps with pt including repair. Pt verbalized understanding and is agreeable with the plan.   3:30 AM Consulted with surgery, Dr.  Maggie Font who recommends quick clot.  Labs (all labs ordered are listed, but only abnormal results are displayed) Labs Reviewed  COMPREHENSIVE METABOLIC PANEL - Abnormal; Notable for the following:       Result Value   Glucose, Bld 106 (*)    Creatinine, Ser 1.25 (*)    GFR calc non Af Amer 42 (*)    GFR calc Af Amer 49 (*)    All other components within normal limits  CBC  POC OCCULT BLOOD, ED  TYPE AND SCREEN  ABO/RH    EKG  EKG Interpretation None       Radiology No results found.  Procedures .Marland KitchenLaceration Repair Date/Time: 10/27/2016 2:44 AM Performed by: Tyrone Nine Everlene Cunning Authorized by: Deno Etienne   Consent:    Consent obtained:  Verbal   Consent given by:  Patient   Risks discussed:  Pain and need for additional repair   Alternatives discussed:  No treatment Anesthesia (see MAR for exact dosages):    Anesthesia method:  Local infiltration   Local anesthetic:  Lidocaine 2% WITH epi Laceration details:    Location:  Anogenital   Anogenital location:  Anus   Length (cm):  0.5 Repair type:    Repair type:  Simple Pre-procedure details:    Preparation:  Patient was prepped and draped in usual sterile fashion Post-procedure details:    Patient tolerance of procedure:  Tolerated well, no immediate complications Comments:     Brisk bleeding from a hemorrhoid. Improved with lidocaine with epinephrine and direct pressure.     (including critical care time)  Medications Ordered in ED Medications  lidocaine-EPINEPHrine (XYLOCAINE W/EPI) 2 %-1:200000 (PF) injection 20 mL (not administered)     Initial Impression / Assessment and Plan / ED Course  I have reviewed the triage vital signs and the nursing notes.  Pertinent labs & imaging results that were available during my care of the patient were reviewed by me and considered in my medical decision making (see chart for details).     71 yo F With a chief complaint of rectal bleeding. Going on since earlier today.  She disimpacted herself and then felt that some flesh came out and then she had some brisk bleeding. Passing some clots as well. Denies any bowel movements just feels that it runs freely. Having some itching and burning to the area as well. On exam with brisk bleeding from a hemorrhoid. I discussed the case with general surgery recommended quick clot. Applied with improvement. Discharge home.  5:30 AM:  I have discussed the diagnosis/risks/treatment options with the patient and family and believe the pt to be eligible for discharge home to follow-up with PCP. We also discussed returning to the ED immediately if new or worsening sx occur. We discussed the sx which are most concerning (e.g., sudden worsening pain, fever, inability to  tolerate by mouth) that necessitate immediate return. Medications administered to the patient during their visit and any new prescriptions provided to the patient are listed below.  Medications given during this visit Medications  lidocaine-EPINEPHrine (XYLOCAINE W/EPI) 2 %-1:200000 (PF) injection 20 mL (not administered)     The patient appears reasonably screen and/or stabilized for discharge and I doubt any other medical condition or other [facility] requiring further screening, evaluation, or treatment in the ED at this time prior to discharge.    Final Clinical Impressions(s) / ED Diagnoses   Final diagnoses:  Bleeding hemorrhoid    New Prescriptions Discharge Medication List as of 10/27/2016  4:19 AM      I personally performed the services described in this documentation, which was scribed in my presence. The recorded information has been reviewed and is accurate.     Deno Etienne, DO 10/27/16 0530

## 2016-10-29 ENCOUNTER — Other Ambulatory Visit: Payer: Self-pay | Admitting: Family Medicine

## 2016-10-29 DIAGNOSIS — D472 Monoclonal gammopathy: Secondary | ICD-10-CM

## 2016-10-31 ENCOUNTER — Ambulatory Visit (INDEPENDENT_AMBULATORY_CARE_PROVIDER_SITE_OTHER): Payer: Medicare Other | Admitting: Family Medicine

## 2016-10-31 ENCOUNTER — Encounter: Payer: Self-pay | Admitting: Family Medicine

## 2016-10-31 VITALS — BP 118/78 | HR 86 | Temp 97.7°F | Ht 65.5 in | Wt 180.5 lb

## 2016-10-31 DIAGNOSIS — D472 Monoclonal gammopathy: Secondary | ICD-10-CM | POA: Diagnosis not present

## 2016-10-31 DIAGNOSIS — K644 Residual hemorrhoidal skin tags: Secondary | ICD-10-CM | POA: Diagnosis not present

## 2016-10-31 DIAGNOSIS — N183 Chronic kidney disease, stage 3 unspecified: Secondary | ICD-10-CM

## 2016-10-31 DIAGNOSIS — F331 Major depressive disorder, recurrent, moderate: Secondary | ICD-10-CM

## 2016-10-31 MED ORDER — SERTRALINE HCL 25 MG PO TABS: 25.0000 mg | ORAL_TABLET | Freq: Every day | ORAL | 3 refills | Status: DC

## 2016-10-31 MED ORDER — TERBINAFINE HCL 1 % EX CREA: 1.0000 "application " | TOPICAL_CREAM | Freq: Two times a day (BID) | CUTANEOUS | 0 refills | Status: DC

## 2016-10-31 NOTE — Assessment & Plan Note (Signed)
Recurrent. Intolerant to cymbalta in the past. She doesn't remember effect of sertraline - will retrial at lowest dose, pt will cut 25mg  into 1/2 tablets to take one daily and update me with effect. She continues 1/2 tab xanax nightly.

## 2016-10-31 NOTE — Assessment & Plan Note (Signed)
S/p recent treatment with injection lidocaine/epinephrine in ER and now largely resolved. Pt will let me know if recurrent symptoms.

## 2016-10-31 NOTE — Assessment & Plan Note (Addendum)
Reviewed this diagnosis with patient as well as anticipated chronic nature of illness.

## 2016-10-31 NOTE — Assessment & Plan Note (Addendum)
Reviewed recent abnormal SPEP/IFE with patient (a poorly-defined area of restricted protein mobility is detected and is reactive with IgM and Lambda antisera (10/2016)). Will refer to hematology for further evaluation. Pt with h/o chronic fatigue, wonders about chronic fatigue/immune dysfunction syndrome. Consider referral to immunologist pending heme recs.

## 2016-10-31 NOTE — Progress Notes (Signed)
BP 118/78   Pulse 86   Temp 97.7 F (36.5 C)   Ht 5' 5.5" (1.664 m)   Wt 180 lb 8 oz (81.9 kg)   SpO2 98%   BMI 29.58 kg/m    CC: f/u labs Subjective:    Patient ID: Ashley [name], female    DOB: July 21, 1945, 71 y.o.   MRN: [id number]  HPI: Ashley [name] is a 71 y.o. female presenting on 10/31/2016 for Follow-up (labs)   Seen at ER 10/27/2016 with bleeding hemorrhoid, treated with lidocaine with epi and direct pressure with good effect. It has significantly improved with minimal bleeding each day. Was advised needed referral to see [facility] so she has not followed up yet with them.   Feeling worsening depression over last few months. Prior cymbalta was ineffective. She doesn't remember effect of sertraline started last year.   Relevant past medical, surgical, family and social history reviewed and updated as indicated. Interim medical history since our last visit reviewed. Allergies and medications reviewed and updated. Outpatient Medications Prior to Visit  Medication Sig Dispense Refill  . albuterol (PROVENTIL HFA;VENTOLIN HFA) 108 (90 Base) MCG/ACT inhaler Inhale 2 puffs into the lungs every 6 (six) hours as needed for wheezing or shortness of breath. 1 Inhaler 0  . alendronate (FOSAMAX) 70 MG tablet Take 70 mg by mouth once a week. Patient takes every Thursday    . ALPRAZolam (XANAX) 0.5 MG tablet Take 0.5 tablets (0.25 mg total) by mouth at bedtime as needed for sleep. 30 tablet 1  . AMBULATORY NON FORMULARY MEDICATION Medication Name:  1 squatty potty to use as needed 1 each 0  . Ascorbic Acid (VITAMIN C) 1000 MG tablet Take 1,000 mg by mouth daily.    Marland Kitchen aspirin EC 81 MG tablet Take 81 mg by mouth daily.    . Calcium Carbonate-Vitamin D (CALCIUM + D PO) Take 1 tablet by mouth daily.    . carvedilol (COREG) 6.25 MG tablet TAKE 1 TABLET (6.25 MG TOTAL) BY MOUTH 2 (TWO) TIMES DAILY WITH A MEAL. 180 tablet 3  . cholecalciferol (VITAMIN D) 1000 units tablet Take 2,000 Units  by mouth daily.    Marland Kitchen dextromethorphan (DELSYM) 30 MG/5ML liquid Take 15 mg by mouth at bedtime as needed for cough.    . ezetimibe (ZETIA) 10 MG tablet TAKE 1 TABLET (10 MG TOTAL) BY MOUTH DAILY. 30 tablet 6  . nystatin (MYCOSTATIN/NYSTOP) powder Apply topically 2 (two) times daily as needed. 30 g 0  . omeprazole (PRILOSEC) 40 MG capsule Take 1 capsule (40 mg total) by mouth daily.    . potassium chloride (K-DUR) 10 MEQ tablet Take 10 mEq by mouth as directed. Take daily as needed    . Pyridoxine HCl (VITAMIN B6 PO) Take 1 capsule by mouth daily. Reported on 07/12/2015    . Red Yeast Rice 600 MG CAPS Take 1 capsule (600 mg total) by mouth 2 (two) times daily.    Marland Kitchen spironolactone (ALDACTONE) 12.5 mg TABS tablet Take 12.5 mg by mouth daily.    Marland Kitchen torsemide (DEMADEX) 10 MG tablet Take 10 mg by mouth daily as needed (swelling/fluid).    . triamcinolone (NASACORT) 55 MCG/ACT AERO nasal inhaler Place 2 sprays into the nose 2 (two) times daily.    . valsartan (DIOVAN) 80 MG tablet Take 1 tablet (80 mg total) by mouth daily. 30 tablet 3  . vitamin E 100 UNIT capsule Take 100 Units by mouth daily.    Marland Kitchen  terbinafine (LAMISIL) 1 % cream Apply 1 application topically 2 (two) times daily. 45 g 0   No facility-administered medications prior to visit.      Per HPI unless specifically indicated in ROS section below Review of Systems     Objective:    BP 118/78   Pulse 86   Temp 97.7 F (36.5 C)   Ht 5' 5.5" (1.664 m)   Wt 180 lb 8 oz (81.9 kg)   SpO2 98%   BMI 29.58 kg/m   Wt Readings from Last 3 Encounters:  10/31/16 180 lb 8 oz (81.9 kg)  10/27/16 182 lb (82.6 kg)  10/24/16 181 lb 4 oz (82.2 kg)    Physical Exam  Constitutional: She appears well-developed and well-nourished. No distress.  Psychiatric: She has a normal mood and affect.  Nursing note and vitals reviewed.  Results for orders placed or performed during the hospital encounter of 10/27/16  Comprehensive metabolic panel  Result  Value Ref Range   Sodium 138 135 - 145 mmol/L   Potassium 4.1 3.5 - 5.1 mmol/L   Chloride 106 101 - 111 mmol/L   CO2 23 22 - 32 mmol/L   Glucose, Bld 106 (H) 65 - 99 mg/dL   BUN 18 6 - 20 mg/dL   Creatinine, Ser 1.25 (H) 0.44 - 1.00 mg/dL   Calcium 9.7 8.9 - 10.3 mg/dL   Total Protein 7.5 6.5 - 8.1 g/dL   Albumin 4.1 3.5 - 5.0 g/dL   AST 24 15 - 41 U/L   ALT 27 14 - 54 U/L   Alkaline Phosphatase 89 38 - 126 U/L   Total Bilirubin 0.4 0.3 - 1.2 mg/dL   GFR calc non Af Amer 42 (L) >60 mL/min   GFR calc Af Amer 49 (L) >60 mL/min   Anion gap 9 5 - 15  CBC  Result Value Ref Range   WBC 10.3 4.0 - 10.5 K/uL   RBC 4.12 3.87 - 5.11 MIL/uL   Hemoglobin 12.1 12.0 - 15.0 g/dL   HCT 37.8 36.0 - 46.0 %   MCV 91.7 78.0 - 100.0 fL   MCH 29.4 26.0 - 34.0 pg   MCHC 32.0 30.0 - 36.0 g/dL   RDW 13.1 11.5 - 15.5 %   Platelets 276 150 - 400 K/uL  Type and screen [place]  Result Value Ref Range   ABO/RH(D) O POS    Antibody Screen NEG    Sample Expiration 10/30/2016   ABO/Rh  Result Value Ref Range   ABO/RH(D) O POS       Assessment & Plan:   Problem List Items Addressed This Visit    Bleeding external hemorrhoids    S/p recent treatment with injection lidocaine/epinephrine in ER and now largely resolved. Pt will let me know if recurrent symptoms.       CKD (chronic kidney disease) stage 3, GFR 30-59 ml/min    Reviewed this diagnosis with patient as well as anticipated chronic nature of illness.       MDD (major depressive disorder), recurrent episode, moderate (HCC)    Recurrent. Intolerant to cymbalta in the past. She doesn't remember effect of sertraline - will retrial at lowest dose, pt will cut 25mg  into 1/2 tablets to take one daily and update me with effect. She continues 1/2 tab xanax nightly.       Relevant Medications   sertraline (ZOLOFT) 25 MG tablet   Monoclonal gammopathy - Primary    Reviewed recent  abnormal SPEP/IFE with patient (a poorly-defined  area of restricted protein mobility is detected and is reactive with IgM and Lambda antisera (10/2016)). Will refer to hematology for further evaluation. Pt with h/o chronic fatigue, wonders about chronic fatigue/immune dysfunction syndrome. Consider referral to immunologist pending heme recs.           Follow up plan: Return if symptoms worsen or fail to improve.  [name], MD

## 2016-10-31 NOTE — Patient Instructions (Addendum)
We will refer you to hematology - see Rosaria Ferries on your way out.  Let's retrial sertraline for depressed mood.  Let us know if recurrent hemorrhoid trouble.  Good to see you today.

## 2016-11-01 DIAGNOSIS — H16223 Keratoconjunctivitis sicca, not specified as Sjogren's, bilateral: Secondary | ICD-10-CM | POA: Diagnosis not present

## 2016-11-02 DIAGNOSIS — M1711 Unilateral primary osteoarthritis, right knee: Secondary | ICD-10-CM | POA: Diagnosis not present

## 2016-11-02 DIAGNOSIS — M25561 Pain in right knee: Secondary | ICD-10-CM | POA: Diagnosis not present

## 2016-11-03 ENCOUNTER — Telehealth: Payer: Self-pay

## 2016-11-03 NOTE — Telephone Encounter (Signed)
Pt called the triage line in regards to the sertraline. She took the 1st dose at 4pm after her appt 10-31-16. She took 1/2 a tablet. She said within 30 mins she fell asleep for 3 hours. Her husband could not wake her. She said once she woke up, she was drowsy into the next day. She said she is not going to take the sertraline. Please advise.

## 2016-11-06 NOTE — Telephone Encounter (Signed)
Noted. Added to intolerance list. Would she be willing to try different med? Would suggest low dose wellbutrin which may increase energy levels.

## 2016-11-06 NOTE — Telephone Encounter (Signed)
Attempted to contact pt; unable to leave vm 

## 2016-11-08 ENCOUNTER — Encounter: Payer: Self-pay | Admitting: Hematology and Oncology

## 2016-11-08 ENCOUNTER — Telehealth: Payer: Self-pay | Admitting: Hematology and Oncology

## 2016-11-08 NOTE — Telephone Encounter (Signed)
lmom for pt to return call to office.

## 2016-11-08 NOTE — Telephone Encounter (Signed)
Received a call from [place] from [facility] to schedule the pt a hem appt. Appt has been scheduled for the pt to see Dr. Lebron Conners on 7/5 at [place] will notify the pt. Letter mailed to the pt.

## 2016-11-10 NOTE — Telephone Encounter (Signed)
lmom for pt to return call. °

## 2016-11-14 ENCOUNTER — Ambulatory Visit
Admission: RE | Admit: 2016-11-14 | Discharge: 2016-11-14 | Disposition: A | Discharge status: Home / Self Care | Payer: Medicare Other | Source: Ambulatory Visit | Attending: Internal Medicine | Admitting: Internal Medicine

## 2016-11-14 ENCOUNTER — Encounter: Payer: Self-pay | Admitting: Internal Medicine

## 2016-11-14 ENCOUNTER — Ambulatory Visit (INDEPENDENT_AMBULATORY_CARE_PROVIDER_SITE_OTHER): Payer: Medicare Other | Admitting: Internal Medicine

## 2016-11-14 VITALS — BP 110/60 | HR 76 | Resp 16 | Ht 65.5 in | Wt 179.0 lb

## 2016-11-14 DIAGNOSIS — J418 Mixed simple and mucopurulent chronic bronchitis: Secondary | ICD-10-CM

## 2016-11-14 DIAGNOSIS — G4719 Other hypersomnia: Secondary | ICD-10-CM

## 2016-11-14 DIAGNOSIS — R05 Cough: Secondary | ICD-10-CM | POA: Diagnosis not present

## 2016-11-14 MED ORDER — TIOTROPIUM BROMIDE MONOHYDRATE 18 MCG IN CAPS: 18.0000 ug | ORAL_CAPSULE | Freq: Every day | RESPIRATORY_TRACT | 2 refills | Status: DC

## 2016-11-14 NOTE — Progress Notes (Signed)
[name]      Assessment and Plan:  Excessive daytime sleepiness.  -Symptoms and signs of obstructive sleep apnea, we'll send for sleep study. Given her underlying cardiomyopathy with reduced ejection fraction and a in lab sleep study would be indicated to look for evidence of central sleep apnea.  Non-ischemic cardiomyopathy.  -May contribute to daytime fatigue as well as a central sleep apnea. Will send for sleep study.  Chronic systolic congestive heart failure.  -As above. May contribute to obstructive and central sleep apnea.  Chronic bronchitis.  -Chronic production of green sputum, this appears to be consistent with chronic bronchitis. -She tells me that she is a frequent exacerbations, we'll check sputum cultures to look for chronic colonization. -We'll start the patient on Spiriva to see if we can reduce the episodes of exacerbation, as well as production of mucus production.  Date: 11/14/2016  MRN# [id number] Ashley [name] 05/22/1945    Ashley [name] is a 71 y.o. old female seen in consultation for chief complaint of:    Chief Complaint  Patient presents with  . Advice Only    referred [name] daytime somnolence former [facility] patient    HPI:   The patient is a 71 year old female, she was referred for symptoms of excessive daytime sleepiness. She notes it most nights. She goes to bed around 12:30 to midnight. It takes her several minutes to an hour to fall sleep. She thinks she sleeps only 4-5 hours per night. She wakes up 45 times per night. Sleeps until 9:30 AM and then gets out of bed. She has gained approximately 40 pounds over the last 2 years. She has a history of nonischemic cardiomyopathy and AICD.  She has history abdominal surgery and has a lot of pain from that and fibromyalgia.   She has had a sleep study in the past but she has constant leg cramps, so the test was inconclusive.   She has a chronic  cough with green sputum. She has numerous courses of abx, usuallly she takes levaquin (that's the only thing that works).   She takes nasacort for rhinitis.  She has a rescue inhaler which she uses as needed.   CBC 10/27/16; Eos ranges from 100-300.   PMHX:   Past Medical History:  Diagnosis Date  . Abdominal aortic atherosclerosis ([place]) 11/2015   by CT  . Allergy   . Anemia   . Anxiety   . Automatic implantable cardioverter-defibrillator in situ 2012   a. MDT single lead ICD.  Marland Kitchen Bronchiectasis    a. possibly mild, treat URIs aggressively  . Cancer (HCC)    basal cell, arms & neck  . Chest pain    a. prev neg MV.  . Chronic sinusitis   . Chronic systolic CHF (congestive heart failure) (HCC)    a. EF 25-35%, LV dilation, significant MR, goal weight 150lbs;  b. 04/2014 Echo: EF 50-55%, Gr 1 DD.  Marland Kitchen Depression   . Diarrhea 07/14/2008   Qualifier: Diagnosis of  By: Glori Bickers MD, Carmell Austria   . Disorder of vocal cords   . Essential hypertension   . Fibrocystic breast disease   . Fibromyalgia   . GERD (gastroesophageal reflux disease)   . H/O multiple allergies 10/10/2013  . Hemorrhoids   . Hemorrhoids   . History of diverticulitis of colon   . History of pneumonia 2012  . Insomnia   . Irritable bowel syndrome   . Laryngeal nodule  Dr. Thomasena Edis  . Lung disease 07/14/11  . Migraine without aura, without mention of intractable migraine without mention of status migrainosus   . Mitral regurgitation   . Nonischemic cardiomyopathy ([place])    a. Recovered LV fxn - prev as low as 25%, now 50-55% by echo 04/2014;  b. 2012 s/p MDT [id number] [place] VR single lead ICD.  Marland Kitchen Osteoarthritis    knees, back, hands, low back,   . Osteoporosis 01/2013   T-2.7 spine (01/2016)  . Pneumonia 2011  . PONV (postoperative nausea and vomiting)   . Pre-syncope   . Pure hypercholesterolemia   . sigmoid diverticulitis with perforation, abscess and fistula 09/30/2013  . Sleep apnea 2010   borderline, inconclusive-  /w [place]    Surgical Hx:  Past Surgical History:  Procedure Laterality Date  . ABDOMINAL HYSTERECTOMY  1987   w/BSO  . ANAL RECTAL MANOMETRY N/A 02/02/2016   Procedure: ANO RECTAL MANOMETRY;  Surgeon: Mauri Pole, MD;  Location: [facility];  Service: Endoscopy;  Laterality: N/A;  . APPENDECTOMY    . AUGMENTATION MAMMAPLASTY    . BREAST BIOPSY     "I've had 2-3 bx; all benign"  . breast cystectomies     due to FCBD  . BREAST MASS EXCISION  01/2010   Left-fibrocystic change w/intraductal papilloma, no malignancy  . carotid US  19/3790   2-40% LICA, normal RICA  . [place] Bunionectomy  11/04/08   Right Great Toe (Dr. Beola Cord)  . COLONOSCOPY  1998   nml (sigmoid-Gilbert-nol)  . Colonoscopy  01/04/04   polyps, diverticulosis/severe  . COLOSTOMY N/A 10/02/2013   DESCENDING COLOSTOMY;  Surgeon: Imogene Burn. Tsuei, MD  . Derrill Memo REVERSAL N/A 02/10/2014   [name], MD  . CT chest  12/2010   possible bronchiectasis lower lobes, 02/2011 - enlarged bilateral effusions, bibasilar atx  . CT chest  04/2011   no PE.  abnormal R hilar and mediastinal adenopathy --> on rpt 05/2011, resolved  . DEXA  03/28/99   osteopenia L/S  . DEXA  03/12/03   2.2 Spine 0.1 Hip  Osteopenia  . DEXA  11/14/05   1.9 Spine 0.3 Hip  slight improvement  . DEXA  11/22/06   improved osteo and fem neck  . DEXA  01/2013   T score -2.6 at spine  . EMG/MCV  10/16/01   + mild carpal tunnel  . ESOPHAGOGASTRODUODENOSCOPY  09/2014   small HH, irregular Z line, fundic gland polyps with mild gastritis ([place])  . Flex laryngoscopy  06/11/06   ([place]) nml  . Head MRI, Head CT  10/1998   ? H/A focus ([place])  . ICD placement  07/14/11   w/pacemaker  . IMPLANTABLE CARDIOVERTER DEFIBRILLATOR IMPLANT N/A 07/14/2011   IMPLANTABLE CARDIOVERTER DEFIBRILLATOR IMPLANT;  Surgeon: Evans Lance, MD;  Location: [facility] CATH LAB;  Service: Cardiovascular;  Laterality: N/A;  . KNEE ARTHROSCOPY W/ ORIF     Left; "meniscus tear"    . KNEE SURGERY     left  . L/S films  11/21/01   nml; Right hip nml  . LASIK     left eye  . MANDIBLE SURGERY     "got about 6 pins in the bottom of my jaw"  . MANDIBLE SURGERY    . MRI Right Hip  12/12/01   Tendonitis Gluteus Medius to Gtr Trochanter Neg Bursitis (Dr. Eulas Post)  . MRI U/S  11/21/01   min. DDD L/3-4  . NCS  11/2014   L  ulnar neuropathy, mild B median nerve entrapment ([place])  . OSTEOTOMY     Left foot  . PARTIAL COLECTOMY N/A 10/02/2013   PARTIAL COLECTOMY;  Surgeon: Imogene Burn. Tsuei, MD  . sinus surgery     x3 with balloon   . stress myoview  02/2011   no significant ischemia  . TOE AMPUTATION     "toe beside baby toe on left foot; got gangrene from corn"  . TONSILLECTOMY AND ADENOIDECTOMY    . US ECHOCARDIOGRAPHY  04/2011   mildly dilated LV, EF 25-30%, hypokinesis, restrictive physiology, mod MR, no AS   Family Hx:  Family History  Problem Relation Age of Onset  . Lung cancer Father        + smoker  . Colon polyps Father   . Dementia Mother   . Osteoporosis Mother        Lumbar spine  . Stroke Mother        x 5 @ 76 YOA  . Arthritis Mother        hands  . Emphysema Mother   . Alcohol abuse Paternal Grandfather   . Stroke Paternal Grandfather        ?  Marland Kitchen Heart disease Paternal Grandfather   . Hypertension Maternal Grandfather        ?  Marland Kitchen Heart disease Paternal Grandmother   . Colon cancer Neg Hx   . Esophageal cancer Neg Hx   . Gallbladder disease Neg Hx    Social Hx:   Social History  Substance Use Topics  . Smoking status: Never Smoker  . Smokeless tobacco: Never Used     Comment: Lived with smokers x 23 yrs, smoked herself "for a week"  . Alcohol use No   Medication:    Current Outpatient Prescriptions:  .  albuterol (PROVENTIL HFA;VENTOLIN HFA) 108 (90 Base) MCG/ACT inhaler, Inhale 2 puffs into the lungs every 6 (six) hours as needed for wheezing or shortness of breath., Disp: 1 Inhaler, Rfl: 0 .  alendronate (FOSAMAX) 70 MG tablet,  Take 70 mg by mouth once a week. Patient takes every Thursday, Disp: , Rfl:  .  ALPRAZolam (XANAX) 0.5 MG tablet, Take 0.5 tablets (0.25 mg total) by mouth at bedtime as needed for sleep., Disp: 30 tablet, Rfl: 1 .  AMBULATORY NON FORMULARY MEDICATION, Medication Name:  1 squatty potty to use as needed, Disp: 1 each, Rfl: 0 .  Ascorbic Acid (VITAMIN C) 1000 MG tablet, Take 1,000 mg by mouth daily., Disp: , Rfl:  .  aspirin EC 81 MG tablet, Take 81 mg by mouth daily., Disp: , Rfl:  .  Calcium Carbonate-Vitamin D (CALCIUM + D PO), Take 1 tablet by mouth daily., Disp: , Rfl:  .  carvedilol (COREG) 6.25 MG tablet, TAKE 1 TABLET (6.25 MG TOTAL) BY MOUTH 2 (TWO) TIMES DAILY WITH A MEAL., Disp: 180 tablet, Rfl: 3 .  cholecalciferol (VITAMIN D) 1000 units tablet, Take 2,000 Units by mouth daily., Disp: , Rfl:  .  dextromethorphan (DELSYM) 30 MG/5ML liquid, Take 15 mg by mouth at bedtime as needed for cough., Disp: , Rfl:  .  ezetimibe (ZETIA) 10 MG tablet, TAKE 1 TABLET (10 MG TOTAL) BY MOUTH DAILY., Disp: 30 tablet, Rfl: 6 .  nystatin (MYCOSTATIN/NYSTOP) powder, Apply topically 2 (two) times daily as needed., Disp: 30 g, Rfl: 0 .  omeprazole (PRILOSEC) 40 MG capsule, Take 1 capsule (40 mg total) by mouth daily., Disp: , Rfl:  .  potassium chloride (K-DUR)  10 MEQ tablet, Take 10 mEq by mouth as directed. Take daily as needed, Disp: , Rfl:  .  Pyridoxine HCl (VITAMIN B6 PO), Take 1 capsule by mouth daily. Reported on 07/12/2015, Disp: , Rfl:  .  Red Yeast Rice 600 MG CAPS, Take 1 capsule (600 mg total) by mouth 2 (two) times daily., Disp: , Rfl:  .  spironolactone (ALDACTONE) 12.5 mg TABS tablet, Take 12.5 mg by mouth daily., Disp: , Rfl:  .  torsemide (DEMADEX) 10 MG tablet, Take 10 mg by mouth daily as needed (swelling/fluid)., Disp: , Rfl:  .  triamcinolone (NASACORT) 55 MCG/ACT AERO nasal inhaler, Place 2 sprays into the nose 2 (two) times daily., Disp: , Rfl:  .  valsartan (DIOVAN) 80 MG tablet, Take 1  tablet (80 mg total) by mouth daily., Disp: 30 tablet, Rfl: 3 .  vitamin E 100 UNIT capsule, Take 100 Units by mouth daily., Disp: , Rfl:    Allergies:  Antihistamines, chlorpheniramine-type; Codeine; Cyclobenzaprine; Cymbalta [duloxetine hcl]; Influenza vac split [flu virus vaccine]; Prevnar [pneumococcal 13-val conj vacc]; Sulfasalazine; Atorvastatin; Chlorhexidine; Dilaudid [hydromorphone hcl]; Latex; Morphine and related; Pravastatin; Prevacid [lansoprazole]; Red dye; Rosuvastatin; Simvastatin; Other; Penicillins; Sertraline; Tape; and Metaxalone  Review of Systems: Gen:  Denies  fever, sweats, chills HEENT: Denies blurred vision, double vision. bleeds, sore throat Cvc:  No dizziness, chest pain. Resp:   Denies cough, has constant mucus and sputum production. Gi: Denies swallowing difficulty, stomach pain. Gu:  Denies bladder incontinence, burning urine Ext:   No Joint pain, stiffness. Skin: No skin rash,  hives  Endoc:  No polyuria, polydipsia. Psych: No depression, insomnia. Other:  All other systems were reviewed with the patient and were negative other that what is mentioned in the HPI.   Physical Examination:   VS: BP 110/60 (BP Location: Left Arm, Cuff Size: Normal)   Pulse 76   Resp 16   Ht 5' 5.5" (1.664 m)   Wt 179 lb (81.2 kg)   SpO2 98%   BMI 29.33 kg/m   General Appearance: No distress  Neuro:without focal findings,  speech normal,  HEENT: PERRLA, EOM intact.   Pulmonary: normal breath sounds, No wheezing.  CardiovascularNormal S1,S2.  No m/r/g.   Abdomen: Benign, Soft, non-tender. Renal:  No costovertebral tenderness  GU:  No performed at this time. Endoc: No evident thyromegaly, no signs of acromegaly. Skin:   warm, no rashes, no ecchymosis  Extremities: normal, no cyanosis, clubbing.  Other findings:    LABORATORY PANEL:   CBC No results for input(s): WBC, HGB, HCT, PLT in the last 168  hours. ------------------------------------------------------------------------------------------------------------------  Chemistries  No results for input(s): NA, K, CL, CO2, GLUCOSE, BUN, CREATININE, CALCIUM, MG, AST, ALT, ALKPHOS, BILITOT in the last 168 hours.  Invalid input(s): GFRCGP ------------------------------------------------------------------------------------------------------------------  Cardiac Enzymes No results for input(s): TROPONINI in the last 168 hours. ------------------------------------------------------------  RADIOLOGY:  No results found.     Thank  you for the consultation and for allowing [facility] to assist in the care of your patient. Our recommendations are noted above.  Please contact us if we can be of further service.   Marda Stalker, MD.  Board Certified in Internal Medicine, Pulmonary Medicine, [place], and Sleep Medicine.  [facility] Office Number: [phone]  Patricia Pesa, M.D.  Merton Border, M.D  11/14/2016

## 2016-11-14 NOTE — Patient Instructions (Addendum)
--  Will send for sleep study.  --Check sputum, PFT, CXR.  --Start spiriva once daily.  Sleep Apnea Sleep apnea is disorder that affects a person's sleep. A person with sleep apnea has abnormal pauses in their breathing when they sleep. It is hard for them to get a good sleep. This makes a person tired during the day. It also can lead to other physical problems. There are three types of sleep apnea. One type is when breathing stops for a short time because your airway is blocked (obstructive sleep apnea). Another type is when the brain sometimes fails to give the normal signal to breathe to the muscles that control your breathing (central sleep apnea). The third type is a combination of the other two types. HOME CARE   Take all medicine as told by your doctor.  Avoid alcohol, calming medicines (sedatives), and depressant drugs.  Try to lose weight if you are overweight. Talk to your doctor about a healthy weight goal.  Your doctor may have you use a device that helps to open your airway. It can help you get the air that you need. It is called a positive airway pressure (PAP) device.   MAKE SURE YOU:   Understand these instructions.  Will watch your condition.  Will get help right away if you are not doing well or get worse.  It may take approximately 1 month for you to get used to wearing her CPAP every night.  Be sure to work with your machine to get used to it, be patient, it may take time!

## 2016-11-16 ENCOUNTER — Ambulatory Visit: Payer: Medicare Other

## 2016-11-16 ENCOUNTER — Encounter: Payer: Self-pay | Admitting: Hematology and Oncology

## 2016-11-16 ENCOUNTER — Ambulatory Visit (HOSPITAL_BASED_OUTPATIENT_CLINIC_OR_DEPARTMENT_OTHER): Payer: Medicare Other | Admitting: Hematology and Oncology

## 2016-11-16 ENCOUNTER — Ambulatory Visit (INDEPENDENT_AMBULATORY_CARE_PROVIDER_SITE_OTHER): Payer: Medicare Other | Admitting: *Deleted

## 2016-11-16 ENCOUNTER — Telehealth: Payer: Self-pay | Admitting: Cardiology

## 2016-11-16 ENCOUNTER — Telehealth: Payer: Self-pay | Admitting: Hematology and Oncology

## 2016-11-16 VITALS — BP 113/48 | HR 77 | Temp 98.4°F | Resp 18 | Ht 65.5 in | Wt 178.7 lb

## 2016-11-16 DIAGNOSIS — I420 Dilated cardiomyopathy: Secondary | ICD-10-CM | POA: Diagnosis not present

## 2016-11-16 DIAGNOSIS — N189 Chronic kidney disease, unspecified: Secondary | ICD-10-CM

## 2016-11-16 DIAGNOSIS — D472 Monoclonal gammopathy: Secondary | ICD-10-CM | POA: Diagnosis not present

## 2016-11-16 DIAGNOSIS — M818 Other osteoporosis without current pathological fracture: Secondary | ICD-10-CM | POA: Diagnosis not present

## 2016-11-16 DIAGNOSIS — I5022 Chronic systolic (congestive) heart failure: Secondary | ICD-10-CM

## 2016-11-16 LAB — CBC WITH DIFFERENTIAL/PLATELET
BASO%: 0.2 % (ref 0.0–2.0)
Basophils Absolute: 0 10*3/uL (ref 0.0–0.1)
EOS%: 1.5 % (ref 0.0–7.0)
Eosinophils Absolute: 0.2 10*3/uL (ref 0.0–0.5)
HCT: 37.2 % (ref 34.8–46.6)
HGB: 11.9 g/dL (ref 11.6–15.9)
LYMPH%: 16.5 % (ref 14.0–49.7)
MCH: 29.3 pg (ref 25.1–34.0)
MCHC: 32 g/dL (ref 31.5–36.0)
MCV: 91.6 fL (ref 79.5–101.0)
MONO#: 0.9 10*3/uL (ref 0.1–0.9)
MONO%: 6.8 % (ref 0.0–14.0)
NEUT#: 9.3 10*3/uL — ABNORMAL HIGH (ref 1.5–6.5)
NEUT%: 75 % (ref 38.4–76.8)
Platelets: 269 10*3/uL (ref 145–400)
RBC: 4.06 10*6/uL (ref 3.70–5.45)
RDW: 12.9 % (ref 11.2–14.5)
WBC: 12.5 10*3/uL — ABNORMAL HIGH (ref 3.9–10.3)
lymph#: 2.1 10*3/uL (ref 0.9–3.3)

## 2016-11-16 NOTE — Progress Notes (Signed)
[facility] New Visit:  Assessment: Monoclonal gammopathy 71 year old female with numerous medical problems including chronic kidney disease, stage III, fibromyalgia, chronic bronchitis, and history of severe diverticulitis.  Discovered to have a possible monoclonal IgM lambda presence during the workup of profound fatigue. Review of lab works demonstrates normal hematological profile and normal values of total protein and albumin. Kidney function is decreased but this precedes to discovery of abnormal protein.  It is possible that patient does have a monoclonal gammopathy, but more likely to have an oligoclonal gammopathy in the context of inflammatory connective tissue disorder and chronic infections.  Plan: --Repeat SPEP, SIFE, Quant Ig, and serum free light chains --We'll obtain viral serologies for CMV, EBV, HCV, HPV, and HIV. --ESR, CRP, ANA, and rheumatoid factor levels --Return to clinic in 1 week for review of the results  Voice recognition software was used and creation of this note. Despite my best effort at editing the text, some misspelling/errors may have occurred.   Orders Placed This Encounter  Procedures  . CBC with Differential    Standing Status:   Future    Number of Occurrences:   1    Standing Expiration Date:   11/16/2017  . Multiple Myeloma Panel (SPEP&IFE w/QIG)    Standing Status:   Future    Number of Occurrences:   1    Standing Expiration Date:   11/16/2017  . Kappa/lambda light chains    Standing Status:   Future    Number of Occurrences:   1    Standing Expiration Date:   11/16/2017  . CMV IgM    Standing Status:   Future    Number of Occurrences:   1    Standing Expiration Date:   11/16/2017  . CMV antibody, IgG (EIA)    Standing Status:   Future    Number of Occurrences:   1    Standing Expiration Date:   11/16/2017  . Epstein-Barr virus VCA, IgG    Standing Status:   Future    Number of Occurrences:   1    Standing Expiration  Date:   11/16/2017  . Epstein-Barr virus VCA, IgM    Standing Status:   Future    Number of Occurrences:   1    Standing Expiration Date:   11/16/2017  . Epstein-Barr virus early D antigen antibody, IgG    Standing Status:   Future    Number of Occurrences:   1    Standing Expiration Date:   11/16/2017  . Epstein-Barr virus nuclear antigen antibody, IgG    Standing Status:   Future    Number of Occurrences:   1    Standing Expiration Date:   11/16/2017  . Hepatitis B surface antibody    Standing Status:   Future    Number of Occurrences:   1    Standing Expiration Date:   11/16/2017  . Hepatitis B surface antigen    Standing Status:   Future    Number of Occurrences:   1    Standing Expiration Date:   11/16/2017  . Hepatitis B core antibody, total    Standing Status:   Future    Number of Occurrences:   1    Standing Expiration Date:   11/16/2017  . Hepatitis C antibody (reflex if positive)    Standing Status:   Future    Number of Occurrences:   1    Standing Expiration Date:   11/16/2017  .  HIV antibody (with reflex)    Standing Status:   Future    Number of Occurrences:   1    Standing Expiration Date:   11/16/2017  . Sedimentation rate  . C-reactive protein  . ANA, IFA (with reflex)    Standing Status:   Future    Number of Occurrences:   1    Standing Expiration Date:   11/16/2017  . Rheumatoid factor    Standing Status:   Future    Number of Occurrences:   1    Standing Expiration Date:   11/16/2017    All questions were answered.  . The patient knows to call the clinic with any problems, questions or concerns.  This note was electronically signed.    History of Presenting Illness Ashley [name] 71 y.o. presenting to the [facility] for evaluation of Monoclonal gammopathy of unknown significance. Patient's past history is significant for diverticulosis with diverticulitis severe enough that that required partial colectomy, congestive heart failure, chronic kidney  insufficiency. Patient also has new diagnosis of COPD with chronic bronchitis and recurrent pulmonary infections. Main complains of the patient include significant fatigue, generalized muscle aches joint pains. Patient is suspected to have fibromyalgia, but during the evaluation a SPEP and SIFE were obtained demonstrating a poorly defined area of restricted protein mobility on SPEP consistent with IgM lambda prominence. Concern for possible presence of monoclonal gammopathy or multiple myeloma, patient was referred to our clinic for additional evaluation.  Patient reports feeling generally unwell. Her main complaints include muscle aches body pains poor energy level, depressed mood. She tends to react to the hip pain by reducing her activity level and resting more. Patient does have chronic cough productive of greenish sputum, followed by pulmonology. Patient denies active current chest pain. Denies palpitations. No nausea, vomiting, but does have abdominal discomfort and tends to have frequent diarrhea with any increase in fiber in her diet. No significant constipation at this time. Denies any urinary symptoms. No new neurological deficits.  Medical History: Past Medical History:  Diagnosis Date  . Abdominal aortic atherosclerosis ([place]) 11/2015   by CT  . Allergy   . Anemia   . Anxiety   . Automatic implantable cardioverter-defibrillator in situ 2012   a. MDT single lead ICD.  Marland Kitchen Bronchiectasis    a. possibly mild, treat URIs aggressively  . Cancer (HCC)    basal cell, arms & neck  . Chest pain    a. prev neg MV.  . Chronic sinusitis   . Chronic systolic CHF (congestive heart failure) (HCC)    a. EF 25-35%, LV dilation, significant MR, goal weight 150lbs;  b. 04/2014 Echo: EF 50-55%, Gr 1 DD.  Marland Kitchen Depression   . Diarrhea 07/14/2008   Qualifier: Diagnosis of  By: Glori Bickers MD, Carmell Austria   . Disorder of vocal cords   . Essential hypertension   . Fibrocystic breast disease   . Fibromyalgia   . GERD  (gastroesophageal reflux disease)   . H/O multiple allergies 10/10/2013  . Hemorrhoids   . Hemorrhoids   . History of diverticulitis of colon   . History of pneumonia 2012  . Insomnia   . Irritable bowel syndrome   . Laryngeal nodule    Dr. Thomasena Edis  . Lung disease 07/14/11  . Migraine without aura, without mention of intractable migraine without mention of status migrainosus   . Mitral regurgitation   . Nonischemic cardiomyopathy ([place])    a. Recovered LV fxn - prev  as low as 25%, now 50-55% by echo 04/2014;  b. 2012 s/p MDT [id number] [place] VR single lead ICD.  Marland Kitchen Osteoarthritis    knees, back, hands, low back,   . Osteoporosis 01/2013   T-2.7 spine (01/2016)  . Pneumonia 2011  . PONV (postoperative nausea and vomiting)   . Pre-syncope   . Pure hypercholesterolemia   . sigmoid diverticulitis with perforation, abscess and fistula 09/30/2013  . Sleep apnea 2010   borderline, inconclusive- /w [place]     Surgical History: Past Surgical History:  Procedure Laterality Date  . ABDOMINAL HYSTERECTOMY  1987   w/BSO  . ANAL RECTAL MANOMETRY N/A 02/02/2016   Procedure: ANO RECTAL MANOMETRY;  Surgeon: Mauri Pole, MD;  Location: [facility];  Service: Endoscopy;  Laterality: N/A;  . APPENDECTOMY    . AUGMENTATION MAMMAPLASTY    . BREAST BIOPSY     "I've had 2-3 bx; all benign"  . breast cystectomies     due to FCBD  . BREAST MASS EXCISION  01/2010   Left-fibrocystic change w/intraductal papilloma, no malignancy  . carotid US  16/1096   0-45% LICA, normal RICA  . [place] Bunionectomy  11/04/08   Right Great Toe (Dr. Beola Cord)  . COLONOSCOPY  1998   nml (sigmoid-Gilbert-nol)  . Colonoscopy  01/04/04   polyps, diverticulosis/severe  . COLOSTOMY N/A 10/02/2013   DESCENDING COLOSTOMY;  Surgeon: Imogene Burn. Tsuei, MD  . Derrill Memo REVERSAL N/A 02/10/2014   [name], MD  . CT chest  12/2010   possible bronchiectasis lower lobes, 02/2011 - enlarged bilateral effusions, bibasilar atx   . CT chest  04/2011   no PE.  abnormal R hilar and mediastinal adenopathy --> on rpt 05/2011, resolved  . DEXA  03/28/99   osteopenia L/S  . DEXA  03/12/03   2.2 Spine 0.1 Hip  Osteopenia  . DEXA  11/14/05   1.9 Spine 0.3 Hip  slight improvement  . DEXA  11/22/06   improved osteo and fem neck  . DEXA  01/2013   T score -2.6 at spine  . EMG/MCV  10/16/01   + mild carpal tunnel  . ESOPHAGOGASTRODUODENOSCOPY  09/2014   small HH, irregular Z line, fundic gland polyps with mild gastritis ([place])  . Flex laryngoscopy  06/11/06   ([place]) nml  . Head MRI, Head CT  10/1998   ? H/A focus ([place])  . ICD placement  07/14/11   w/pacemaker  . IMPLANTABLE CARDIOVERTER DEFIBRILLATOR IMPLANT N/A 07/14/2011   IMPLANTABLE CARDIOVERTER DEFIBRILLATOR IMPLANT;  Surgeon: Evans Lance, MD;  Location: [facility] CATH LAB;  Service: Cardiovascular;  Laterality: N/A;  . KNEE ARTHROSCOPY W/ ORIF     Left; "meniscus tear"  . KNEE SURGERY     left  . L/S films  11/21/01   nml; Right hip nml  . LASIK     left eye  . MANDIBLE SURGERY     "got about 6 pins in the bottom of my jaw"  . MANDIBLE SURGERY    . MRI Right Hip  12/12/01   Tendonitis Gluteus Medius to Gtr Trochanter Neg Bursitis (Dr. Eulas Post)  . MRI U/S  11/21/01   min. DDD L/3-4  . NCS  11/2014   L ulnar neuropathy, mild B median nerve entrapment ([place])  . OSTEOTOMY     Left foot  . PARTIAL COLECTOMY N/A 10/02/2013   PARTIAL COLECTOMY;  Surgeon: Imogene Burn. Tsuei, MD  . sinus surgery     x3 with balloon   .  stress myoview  02/2011   no significant ischemia  . TOE AMPUTATION     "toe beside baby toe on left foot; got gangrene from corn"  . TONSILLECTOMY AND ADENOIDECTOMY    . US ECHOCARDIOGRAPHY  04/2011   mildly dilated LV, EF 25-30%, hypokinesis, restrictive physiology, mod MR, no AS    Family History: Family History  Problem Relation Age of Onset  . Lung cancer Father        + smoker  . Colon polyps Father   . Dementia Mother   . Osteoporosis  Mother        Lumbar spine  . Stroke Mother        x 5 @ 60 YOA  . Arthritis Mother        hands  . Emphysema Mother   . Alcohol abuse Paternal Grandfather   . Stroke Paternal Grandfather        ?  Marland Kitchen Heart disease Paternal Grandfather   . Hypertension Maternal Grandfather        ?  Marland Kitchen Heart disease Paternal Grandmother   . Colon cancer Neg Hx   . Esophageal cancer Neg Hx   . Gallbladder disease Neg Hx     Social History: Social History   Social History  . Marital status: Married    Spouse name: N/A  . Number of children: 1  . Years of education: N/A   Occupational History  . 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 [employer]    Social History Main Topics  . Smoking status: Never Smoker  . Smokeless tobacco: Never Used     Comment: Lived with smokers x 23 yrs, smoked herself "for a week"  . Alcohol use No  . Drug use: No  . Sexual activity: Not Currently   Other Topics Concern  . Not on file   Social History Narrative   1 adopted child      Lives with husband             Allergies: Allergies  Allergen Reactions  . Antihistamines, Chlorpheniramine-Type Other (See Comments)    Makes her eyes look like she sees strobe lights  . Codeine Other (See Comments)    Confusion and dizziness  . Cyclobenzaprine Other (See Comments)    Adverse reaction - back and throat pain, dizziness, exhaustion  . Cymbalta [Duloxetine Hcl] Other (See Comments)    "body spasms and made me feel weird in my head"   . Influenza Vac Split [Flu Virus Vaccine] Other (See Comments)    "allergic to concentrated eggs that are in vaccine"  . Prevnar [Pneumococcal 13-Val Conj Vacc] Other (See Comments)    Egg allergy Bad reaction after shot  . Sulfasalazine Other (See Comments)    "makes my whole body smell like sulfa & makes me sick just smelling it"  . Atorvastatin Other (See Comments)    Severe muscle cramps to generic atorvastatin.  Able to take brand lipitor.  .  Chlorhexidine Other (See Comments)    blisters  . Dilaudid [Hydromorphone Hcl] Other (See Comments)    Feels like she is burning internally   . Latex Nausea Only, Rash and Other (See Comments)    Pain and infection  . Morphine And Related Other (See Comments)    Internally feels like she is on fire   . Pravastatin Other (See Comments)    myalgias  . Prevacid [Lansoprazole] Other (See Comments)    Worsened GI side effects  .  Red Dye Nausea Only and Swelling  . Rosuvastatin Other (See Comments)    Was not effective controlling lipids  . Simvastatin Other (See Comments)    Leg cramps  . Other     NOTE: pt is able to take cephalosporins without reaction  . Penicillins Other (See Comments)    REACTION: "told 50 years ago I couldn't take it; might be immune to it", able to take amoxicillin  . Sertraline Other (See Comments)    Overly sedating  . Tape Other (See Comments)    All adhesives  - Blisters, itching and burning   . Metaxalone Other (See Comments)    REACTION: unknown    Medications:  Current Outpatient Prescriptions  Medication Sig Dispense Refill  . albuterol (PROVENTIL HFA;VENTOLIN HFA) 108 (90 Base) MCG/ACT inhaler Inhale 2 puffs into the lungs every 6 (six) hours as needed for wheezing or shortness of breath. 1 Inhaler 0  . alendronate (FOSAMAX) 70 MG tablet Take 70 mg by mouth once a week. Patient takes every Thursday    . ALPRAZolam (XANAX) 0.5 MG tablet Take 0.5 tablets (0.25 mg total) by mouth at bedtime as needed for sleep. 30 tablet 1  . AMBULATORY NON FORMULARY MEDICATION Medication Name:  1 squatty potty to use as needed 1 each 0  . Ascorbic Acid (VITAMIN C) 1000 MG tablet Take 1,000 mg by mouth daily.    Marland Kitchen aspirin EC 81 MG tablet Take 81 mg by mouth daily.    . Calcium Carbonate-Vitamin D (CALCIUM + D PO) Take 1 tablet by mouth daily.    . carvedilol (COREG) 6.25 MG tablet TAKE 1 TABLET (6.25 MG TOTAL) BY MOUTH 2 (TWO) TIMES DAILY WITH A MEAL. 180 tablet 3  .  cholecalciferol (VITAMIN D) 1000 units tablet Take 2,000 Units by mouth daily.    Marland Kitchen dextromethorphan (DELSYM) 30 MG/5ML liquid Take 15 mg by mouth at bedtime as needed for cough.    . ezetimibe (ZETIA) 10 MG tablet TAKE 1 TABLET (10 MG TOTAL) BY MOUTH DAILY. 30 tablet 6  . nystatin (MYCOSTATIN/NYSTOP) powder Apply topically 2 (two) times daily as needed. 30 g 0  . omeprazole (PRILOSEC) 40 MG capsule Take 1 capsule (40 mg total) by mouth daily.    . potassium chloride (K-DUR) 10 MEQ tablet Take 10 mEq by mouth as directed. Take daily as needed    . Pyridoxine HCl (VITAMIN B6 PO) Take 1 capsule by mouth daily. Reported on 07/12/2015    . Red Yeast Rice 600 MG CAPS Take 1 capsule (600 mg total) by mouth 2 (two) times daily.    Marland Kitchen spironolactone (ALDACTONE) 12.5 mg TABS tablet Take 12.5 mg by mouth daily.    Marland Kitchen tiotropium (SPIRIVA HANDIHALER) 18 MCG inhalation capsule Place 1 capsule (18 mcg total) into inhaler and inhale daily. 30 capsule 2  . torsemide (DEMADEX) 10 MG tablet Take 10 mg by mouth daily as needed (swelling/fluid).    . triamcinolone (NASACORT) 55 MCG/ACT AERO nasal inhaler Place 2 sprays into the nose 2 (two) times daily.    . valsartan (DIOVAN) 80 MG tablet Take 1 tablet (80 mg total) by mouth daily. 30 tablet 3  . vitamin E 100 UNIT capsule Take 100 Units by mouth daily.     No current facility-administered medications for this visit.     Review of Systems: Review of Systems  Constitutional: Positive for appetite change and fatigue. Negative for chills, diaphoresis, fever and unexpected weight change.  Poor appetite.  HENT:   Negative for lump/mass, mouth sores, nosebleeds, sore throat and trouble swallowing.   Eyes: Negative for eye problems and icterus.  Respiratory: Positive for cough and wheezing. Negative for hemoptysis and shortness of breath.   Cardiovascular: Positive for leg swelling. Negative for chest pain and palpitations.  Gastrointestinal: Positive for  abdominal pain and diarrhea. Negative for blood in stool, constipation, nausea and vomiting.  Genitourinary: Negative for difficulty urinating, dysuria, hematuria and vaginal discharge.   Musculoskeletal: Positive for arthralgias, back pain and myalgias.  Skin: Negative for itching and rash.  Neurological: Positive for headaches. Negative for dizziness, extremity weakness, light-headedness, numbness and seizures.  Hematological: Negative for adenopathy. Bruises/bleeds easily.  Psychiatric/Behavioral: Positive for depression. Negative for suicidal ideas.     PHYSICAL EXAMINATION Blood pressure (!) 113/48, pulse 77, temperature 98.4 F (36.9 C), temperature source Oral, resp. rate 18, height 5' 5.5" (1.664 m), weight 178 lb 11.2 oz (81.1 kg), SpO2 98 %.  ECOG PERFORMANCE STATUS: 3 - Symptomatic, >50% confined to bed  Physical Exam  Constitutional: She is oriented to person, place, and time and well-developed, well-nourished, and in no distress.  HENT:  Head: Atraumatic.  Right Ear: External ear normal.  Left Ear: External ear normal.  Mouth/Throat: Oropharynx is clear and moist. No oropharyngeal exudate.  Eyes: EOM are normal. Pupils are equal, round, and reactive to light. No scleral icterus.  Neck: No thyromegaly present.  Cardiovascular: Normal rate and regular rhythm.  Exam reveals no gallop.   No murmur heard. Pulmonary/Chest: No stridor. No respiratory distress. She has wheezes. She exhibits tenderness.  Abdominal: Soft. She exhibits no distension and no mass. There is tenderness. There is no rebound.  Musculoskeletal: She exhibits no edema or tenderness.  Some tenderness with possible presence of trigger points on palpation of the upper back and upper arms.  Lymphadenopathy:    She has no cervical adenopathy.  Neurological: She is alert and oriented to person, place, and time.  Skin: Skin is warm. No rash noted. She is not diaphoretic.     LABORATORY DATA: I have personally  reviewed the data as listed: Appointment on 11/16/2016  Component Date Value Ref Range Status  . WBC 11/16/2016 12.5* 3.9 - 10.3 10e3/uL Final  . NEUT# 11/16/2016 9.3* 1.5 - 6.5 10e3/uL Final  . HGB 11/16/2016 11.9  11.6 - 15.9 g/dL Final  . HCT 11/16/2016 37.2  34.8 - 46.6 % Final  . Platelets 11/16/2016 269  145 - 400 10e3/uL Final  . MCV 11/16/2016 91.6  79.5 - 101.0 fL Final  . MCH 11/16/2016 29.3  25.1 - 34.0 pg Final  . MCHC 11/16/2016 32.0  31.5 - 36.0 g/dL Final  . RBC 11/16/2016 4.06  3.70 - 5.45 10e6/uL Final  . RDW 11/16/2016 12.9  11.2 - 14.5 % Final  . lymph# 11/16/2016 2.1  0.9 - 3.3 10e3/uL Final  . MONO# 11/16/2016 0.9  0.1 - 0.9 10e3/uL Final  . Eosinophils Absolute 11/16/2016 0.2  0.0 - 0.5 10e3/uL Final  . Basophils Absolute 11/16/2016 0.0  0.0 - 0.1 10e3/uL Final  . NEUT% 11/16/2016 75.0  38.4 - 76.8 % Final  . LYMPH% 11/16/2016 16.5  14.0 - 49.7 % Final  . MONO% 11/16/2016 6.8  0.0 - 14.0 % Final  . EOS% 11/16/2016 1.5  0.0 - 7.0 % Final  . BASO% 11/16/2016 0.2  0.0 - 2.0 % Final         Ardath Sax, MD

## 2016-11-16 NOTE — Patient Instructions (Signed)
Thank you for choosing [facility] to provide your oncology and hematology care.  To afford each patient quality time with our providers, please arrive 30 minutes before your scheduled appointment time.  If you arrive late for your appointment, you may be asked to reschedule.  We strive to give you quality time with our providers, and arriving late affects you and other patients whose appointments are after yours.   If you are a no show for multiple scheduled visits, you may be dismissed from the clinic at the providers discretion.    Again, thank you for choosing [facility], our hope is that these requests will decrease the amount of time that you wait before being seen by our physicians.  ______________________________________________________________________  Should you have questions after your visit to the [facility], please contact our office at [phone] between the hours of 8:30 and 4:30 p.m.    Voicemails left after 4:30p.m will not be returned until the following business day.    For prescription refill requests, please have your pharmacy contact us directly.  Please also try to allow 48 hours for prescription requests.    Please contact the scheduling department for questions regarding scheduling.  For scheduling of procedures such as PET scans, CT scans, MRI, Ultrasound, etc please contact central scheduling at [phone].    Resources For Cancer Patients and Caregivers:   Oncolink.org:  A wonderful resource for patients and healthcare providers for information regarding your disease, ways to tract your treatment, what to expect, etc.     American Cancer Society:  800-227-2345  Can help patients locate various types of support and financial assistance  Cancer Care: 1-800-813-HOPE (4673) Provides financial assistance, online support groups, medication/co-pay assistance.    [place] DSS:  336-641-3447 Where to apply for food  stamps, Medicaid, and utility assistance  Medicare Rights Center: 800-333-4114 Helps people with Medicare understand their rights and benefits, navigate the Medicare system, and secure the quality healthcare they deserve  SCAT: 336-333-6589 [employer] [place] [employer]'s shared-ride transportation service for eligible riders who have a disability that prevents them from riding the fixed route bus.    For additional information on assistance programs please contact our social worker:   Grier Hock/Abigail Elmore:  [phone]            

## 2016-11-16 NOTE — Telephone Encounter (Signed)
Scheduled appt per 7/5 los - Gave patient AVS and calender per los.  

## 2016-11-16 NOTE — Assessment & Plan Note (Signed)
71 year old female with numerous medical problems including chronic kidney disease, stage III, fibromyalgia, chronic bronchitis, and history of severe diverticulitis.  Discovered to have a possible monoclonal IgM lambda presence during the workup of profound fatigue. Review of lab works demonstrates normal hematological profile and normal values of total protein and albumin. Kidney function is decreased but this precedes to discovery of abnormal protein.  It is possible that patient does have a monoclonal gammopathy, but more likely to have an oligoclonal gammopathy in the context of inflammatory connective tissue disorder and chronic infections.  Plan: --Repeat SPEP, SIFE, Quant Ig, and serum free light chains --We'll obtain viral serologies for CMV, EBV, HCV, HPV, and HIV. --ESR, CRP, ANA, and rheumatoid factor levels --Return to clinic in 1 week for review of the results  Voice recognition software was used and creation of this note. Despite my best effort at editing the text, some misspelling/errors may have occurred.

## 2016-11-16 NOTE — Telephone Encounter (Signed)
LMOVM reminding pt to send remote transmission.   

## 2016-11-17 ENCOUNTER — Telehealth: Payer: Self-pay | Admitting: Internal Medicine

## 2016-11-17 ENCOUNTER — Other Ambulatory Visit
Admission: RE | Admit: 2016-11-17 | Discharge: 2016-11-17 | Disposition: A | Discharge status: Home / Self Care | Payer: Medicare Other | Source: Ambulatory Visit | Attending: Internal Medicine | Admitting: Internal Medicine

## 2016-11-17 DIAGNOSIS — M65312 Trigger thumb, left thumb: Secondary | ICD-10-CM | POA: Diagnosis not present

## 2016-11-17 DIAGNOSIS — G4710 Hypersomnia, unspecified: Secondary | ICD-10-CM | POA: Insufficient documentation

## 2016-11-17 DIAGNOSIS — J418 Mixed simple and mucopurulent chronic bronchitis: Secondary | ICD-10-CM | POA: Insufficient documentation

## 2016-11-17 DIAGNOSIS — M1812 Unilateral primary osteoarthritis of first carpometacarpal joint, left hand: Secondary | ICD-10-CM | POA: Diagnosis not present

## 2016-11-17 LAB — CUP PACEART REMOTE DEVICE CHECK
Battery Voltage: 3.01 V
Brady Statistic RV Percent Paced: 0.01 %
Date Time Interrogation Session: 20180705185634
HighPow Impedance: 80 Ohm
Implantable Lead Implant Date: 20130301
Implantable Lead Location: 753860
Implantable Lead Model: 6935
Implantable Pulse Generator Implant Date: 20130301
Lead Channel Impedance Value: 494 Ohm
Lead Channel Pacing Threshold Amplitude: 0.75 V
Lead Channel Pacing Threshold Pulse Width: 0.4 ms
Lead Channel Sensing Intrinsic Amplitude: 6.875 mV
Lead Channel Sensing Intrinsic Amplitude: 6.875 mV
Lead Channel Setting Pacing Amplitude: 2.5 V
Lead Channel Setting Pacing Pulse Width: 0.4 ms
Lead Channel Setting Sensing Sensitivity: 0.3 mV

## 2016-11-17 LAB — KAPPA/LAMBDA LIGHT CHAINS
Ig Kappa Free Light Chain: 16.7 mg/L (ref 3.3–19.4)
Ig Lambda Free Light Chain: 58.6 mg/L — ABNORMAL HIGH (ref 5.7–26.3)
Kappa/Lambda FluidC Ratio: 0.28 (ref 0.26–1.65)

## 2016-11-17 LAB — HEPATITIS B SURFACE ANTIGEN: HBsAg Screen: NEGATIVE

## 2016-11-17 LAB — HEPATITIS C ANTIBODY (REFLEX): HCV Ab: <0.1 s/co ratio (ref 0.0–0.9)

## 2016-11-17 LAB — EXPECTORATED SPUTUM ASSESSMENT W REFEX TO RESP CULTURE

## 2016-11-17 LAB — RHEUMATOID FACTOR: RA Latex Turbid.: <10 IU/mL (ref 0.0–13.9)

## 2016-11-17 LAB — EXPECTORATED SPUTUM ASSESSMENT W GRAM STAIN, RFLX TO RESP C

## 2016-11-17 LAB — EPSTEIN-BARR VIRUS VCA, IGM: EBV VCA IgM: <36 U/mL (ref 0.0–35.9)

## 2016-11-17 LAB — C-REACTIVE PROTEIN: CRP: 2.6 mg/L (ref 0.0–4.9)

## 2016-11-17 LAB — HEPATITIS B CORE ANTIBODY, TOTAL: Hep B Core Ab, Tot: NEGATIVE

## 2016-11-17 LAB — EPSTEIN-BARR VIRUS NUCLEAR ANTIGEN ANTIBODY, IGG: EBV NA IgG: 304 U/mL — ABNORMAL HIGH (ref 0.0–17.9)

## 2016-11-17 LAB — CMV ANTIBODY, IGG (EIA): CMV Ab - IgG: <0.6 U/mL (ref 0.00–0.59)

## 2016-11-17 LAB — EPSTEIN-BARR VIRUS VCA, IGG: EBV VCA IgG: 240 U/mL — ABNORMAL HIGH (ref 0.0–17.9)

## 2016-11-17 LAB — CMV IGM: CMV IgM Ser EIA-aCnc: <30 AU/mL (ref 0.0–29.9)

## 2016-11-17 LAB — SEDIMENTATION RATE: Sedimentation Rate-Westergren: 17 mm/hr (ref 0–40)

## 2016-11-17 LAB — EPSTEIN-BARR VIRUS EARLY D ANTIGEN ANTIBODY, IGG: EBV Early Antigen Ab, IgG: 17.5 U/mL — ABNORMAL HIGH (ref 0.0–8.9)

## 2016-11-17 LAB — HEPATITIS B SURFACE ANTIBODY,QUALITATIVE: Hep B Surface Ab, Qual: NONREACTIVE

## 2016-11-17 LAB — HIV ANTIBODY (ROUTINE TESTING W REFLEX): HIV Screen 4th Generation wRfx: NONREACTIVE

## 2016-11-17 NOTE — Progress Notes (Signed)
Remote ICD transmission.   

## 2016-11-17 NOTE — Telephone Encounter (Signed)
Pt dropped off specimen Handed to Nurse

## 2016-11-17 NOTE — Telephone Encounter (Signed)
Specimen taken to lab by [facility] in Cardiology.

## 2016-11-19 ENCOUNTER — Other Ambulatory Visit: Payer: Self-pay | Admitting: Family Medicine

## 2016-11-19 LAB — CULTURE, RESPIRATORY W GRAM STAIN: Culture: NORMAL

## 2016-11-19 LAB — CULTURE, RESPIRATORY

## 2016-11-20 LAB — MULTIPLE MYELOMA PANEL, SERUM
Albumin SerPl Elph-Mcnc: 4 g/dL (ref 2.9–4.4)
Albumin/Glob SerPl: 1.1 (ref 0.7–1.7)
Alpha 1: 0.2 g/dL (ref 0.0–0.4)
Alpha2 Glob SerPl Elph-Mcnc: 1 g/dL (ref 0.4–1.0)
B-Globulin SerPl Elph-Mcnc: 1.5 g/dL — ABNORMAL HIGH (ref 0.7–1.3)
Gamma Glob SerPl Elph-Mcnc: 1.2 g/dL (ref 0.4–1.8)
Globulin, Total: 3.9 g/dL (ref 2.2–3.9)
IgA, Qn, Serum: 144 mg/dL (ref 64–422)
IgG, Qn, Serum: 570 mg/dL — ABNORMAL LOW (ref 700–1600)
IgM, Qn, Serum: 1224 mg/dL — ABNORMAL HIGH (ref 26–217)
Total Protein: 7.9 g/dL (ref 6.0–8.5)

## 2016-11-20 LAB — ANTINUCLEAR ANTIBODIES, IFA: ANA Titer 1: NEGATIVE

## 2016-11-21 MED ORDER — BUPROPION HCL ER (SR) 100 MG PO TB12: 100.0000 mg | ORAL_TABLET | Freq: Every day | ORAL | 3 refills | Status: DC

## 2016-11-21 NOTE — Telephone Encounter (Signed)
wellbutrin sent to pharmacy

## 2016-11-21 NOTE — Telephone Encounter (Signed)
Spoke to patient by telephone and was advised that she never got a message from the office to call back.  Patient notified as instructed by telephone and verbalized understanding, Patient stated that she is willing to try a different medication. Pharmacy- [facility].

## 2016-11-22 ENCOUNTER — Ambulatory Visit (HOSPITAL_BASED_OUTPATIENT_CLINIC_OR_DEPARTMENT_OTHER): Payer: Medicare Other | Admitting: Hematology and Oncology

## 2016-11-22 ENCOUNTER — Encounter: Payer: Self-pay | Admitting: Hematology and Oncology

## 2016-11-22 VITALS — BP 102/53 | HR 91 | Temp 97.9°F | Resp 18 | Ht 65.5 in | Wt 180.2 lb

## 2016-11-22 DIAGNOSIS — D472 Monoclonal gammopathy: Secondary | ICD-10-CM | POA: Diagnosis not present

## 2016-11-22 DIAGNOSIS — R768 Other specified abnormal immunological findings in serum: Secondary | ICD-10-CM

## 2016-11-22 DIAGNOSIS — D803 Selective deficiency of immunoglobulin G [IgG] subclasses: Secondary | ICD-10-CM

## 2016-11-22 NOTE — Assessment & Plan Note (Addendum)
71 year old female with history of multiple varied infections including sinusitis, chronic bronchitis, cutaneous infections, diverticulitis etc. 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 Evaluation of such falls outside of per review of my specialty, and referral to an immunology specialist is recommended at this time.   Patient does have an elevation of lambda light chains twice above the upper range of normal with preservation of kappa to lambda light chain ratio. 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  Plan: --Skeletal survey to eval for possible lytic lesions --Recommend Immunology consultation --RTC in 3 months to repeat protein unless skeletal survey shows lytic disease in the skeletal structures. In that case, I will arrange for a bone marrow biopsy.  Voice recognition software was used and creation of this note. Despite my best effort at editing the text, some misspelling/errors may have occurred.

## 2016-11-22 NOTE — Assessment & Plan Note (Signed)
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 At 500 level, supplementation with IVIG is likely warranted as the patient suffering from recurrent infections. This level of IgG is likely conferring a certain degree of immunosuppression and will need to be further evaluated by immunology.

## 2016-11-22 NOTE — Progress Notes (Signed)
[facility] Follow-up Visit:  Assessment: High total serum IgM 71 year old female with history of multiple varied infections including sinusitis, chronic bronchitis, cutaneous infections, diverticulitis etc. 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 Evaluation of such falls outside of per review of my specialty, and referral to an immunology specialist is recommended at this time.   Patient does have an elevation of lambda light chains twice above the upper range of normal with preservation of kappa to lambda light chain ratio. 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  Plan: --Skeletal survey to eval for possible lytic lesions --Recommend Immunology consultation --RTC in 3 months to repeat protein unless skeletal survey shows lytic disease in the skeletal structures. In that case, I will arrange for a bone marrow biopsy.  Voice recognition software was used and creation of this note. Despite my best effort at editing the text, some misspelling/errors may have occurred.  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 At 500 level, supplementation with IVIG is likely warranted as the patient suffering from recurrent infections. This level of IgG is likely conferring a certain degree of immunosuppression and will need to be further evaluated by immunology.   Orders Placed This Encounter  Procedures  . DG Bone Survey Met    Standing Status:   Future    Standing Expiration Date:   01/23/2018    Order Specific Question:   Reason for Exam (SYMPTOM  OR DIAGNOSIS REQUIRED)     Answer:   Dysproteinemia, please eval for possible lytic lesions to suggest multiple myeloma    Order Specific Question:   Preferred imaging location?    Answer:   [facility]    Order Specific Question:   Radiology Contrast Protocol - do NOT remove file path    Answer:   \\[email or web address]  . Multiple Myeloma Panel (SPEP&IFE w/QIG)    Standing Status:   Future    Standing Expiration Date:   11/22/2017  . Kappa/lambda light chains    Standing Status:   Future    Standing Expiration Date:   11/22/2017  . CBC & Diff and Retic    Standing Status:   Future    Standing Expiration Date:   11/22/2017  . Comprehensive metabolic panel    Standing Status:   Future    Standing Expiration Date:   11/22/2017    Cancer Staging No matching staging information was found for the patient.  All questions were answered.  . The patient knows to call the clinic with any problems, questions or concerns.  This note was electronically signed.    History of Presenting Illness Ashley [name] 71 y.o. 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 Patient's past history is significant for diverticulosis with diverticulitis severe enough that that required partial colectomy, congestive heart failure, chronic kidney insufficiency. Patient also has new diagnosis of COPD with chronic bronchitis and recurrent pulmonary infections. Patient suffers from recurrent cutaneous candidiasis. Main complains of the patient include significant fatigue, generalized muscle aches joint pains. Patient is suspected to have fibromyalgia, but during the evaluation a SPEP and SIFE were obtained demonstrating a poorly defined area  of restricted protein mobility on SPEP consistent with IgM lambda prominence. Concern for possible presence of monoclonal gammopathy or multiple myeloma, patient was referred to our clinic for additional  evaluation.  Patient reports feeling generally unwell. Her main complaints include muscle aches body pains poor energy level, depressed mood. She tends to react to the hip pain by reducing her activity level and resting more. Patient does have chronic cough productive of greenish sputum, followed by pulmonology. Patient denies active current chest pain. Denies palpitations. No nausea, vomiting, but does have abdominal discomfort and tends to have frequent diarrhea with any increase in fiber in her diet. No significant constipation at this time. Denies any urinary symptoms. No new neurological deficits.  Patient returns to the clinic to review results of laboratory evaluation. She denies any new symptoms since the last visit to the clinic.   Oncological/hematological History:  No history exists.    Medical History: Past Medical History:  Diagnosis Date  . Abdominal aortic atherosclerosis ([place]) 11/2015   by CT  . Allergy   . Anemia   . Anxiety   . Automatic implantable cardioverter-defibrillator in situ 2012   a. MDT single lead ICD.  Marland Kitchen Bronchiectasis    a. possibly mild, treat URIs aggressively  . Cancer (HCC)    basal cell, arms & neck  . Chest pain    a. prev neg MV.  . Chronic sinusitis   . Chronic systolic CHF (congestive heart failure) (HCC)    a. EF 25-35%, LV dilation, significant MR, goal weight 150lbs;  b. 04/2014 Echo: EF 50-55%, Gr 1 DD.  Marland Kitchen Depression   . Diarrhea 07/14/2008   Qualifier: Diagnosis of  By: Glori Bickers MD, Carmell Austria   . Disorder of vocal cords   . Essential hypertension   . Fibrocystic breast disease   . Fibromyalgia   . GERD (gastroesophageal reflux disease)   . H/O multiple allergies 10/10/2013  . Hemorrhoids   . Hemorrhoids   . History of diverticulitis of colon   . History of pneumonia 2012  . Insomnia   . Irritable bowel syndrome   . Laryngeal nodule    Dr. Thomasena Edis  . Lung disease 07/14/11  . Migraine without aura, without mention of intractable  migraine without mention of status migrainosus   . Mitral regurgitation   . Nonischemic cardiomyopathy ([place])    a. Recovered LV fxn - prev as low as 25%, now 50-55% by echo 04/2014;  b. 2012 s/p MDT D224VRC Secura VR single lead ICD.  Marland Kitchen Osteoarthritis    knees, back, hands, low back,   . Osteoporosis 01/2013   T-2.7 spine (01/2016)  . Pneumonia 2011  . PONV (postoperative nausea and vomiting)   . Pre-syncope   . Pure hypercholesterolemia   . sigmoid diverticulitis with perforation, abscess and fistula 09/30/2013  . Sleep apnea 2010   borderline, inconclusive- /w [place]     Surgical History: Past Surgical History:  Procedure Laterality Date  . ABDOMINAL HYSTERECTOMY  1987   w/BSO  . ANAL RECTAL MANOMETRY N/A 02/02/2016   Procedure: ANO RECTAL MANOMETRY;  Surgeon: Mauri Pole, MD;  Location: [facility];  Service: Endoscopy;  Laterality: N/A;  . APPENDECTOMY    . AUGMENTATION MAMMAPLASTY    . BREAST BIOPSY     "I've had 2-3 bx; all benign"  . breast cystectomies     due to FCBD  . BREAST MASS EXCISION  01/2010   Left-fibrocystic change w/intraductal papilloma, no malignancy  . carotid  US  12/8108   3-15% LICA, normal RICA  . [place] Bunionectomy  11/04/08   Right Great Toe (Dr. Beola Cord)  . COLONOSCOPY  1998   nml (sigmoid-Gilbert-nol)  . Colonoscopy  01/04/04   polyps, diverticulosis/severe  . COLOSTOMY N/A 10/02/2013   DESCENDING COLOSTOMY;  Surgeon: Imogene Burn. Tsuei, MD  . Derrill Memo REVERSAL N/A 02/10/2014   [name], MD  . CT chest  12/2010   possible bronchiectasis lower lobes, 02/2011 - enlarged bilateral effusions, bibasilar atx  . CT chest  04/2011   no PE.  abnormal R hilar and mediastinal adenopathy --> on rpt 05/2011, resolved  . DEXA  03/28/99   osteopenia L/S  . DEXA  03/12/03   2.2 Spine 0.1 Hip  Osteopenia  . DEXA  11/14/05   1.9 Spine 0.3 Hip  slight improvement  . DEXA  11/22/06   improved osteo and fem neck  . DEXA  01/2013   T score -2.6 at  spine  . EMG/MCV  10/16/01   + mild carpal tunnel  . ESOPHAGOGASTRODUODENOSCOPY  09/2014   small HH, irregular Z line, fundic gland polyps with mild gastritis ([place])  . Flex laryngoscopy  06/11/06   ([place]) nml  . Head MRI, Head CT  10/1998   ? H/A focus ([place])  . ICD placement  07/14/11   w/pacemaker  . IMPLANTABLE CARDIOVERTER DEFIBRILLATOR IMPLANT N/A 07/14/2011   IMPLANTABLE CARDIOVERTER DEFIBRILLATOR IMPLANT;  Surgeon: Evans Lance, MD;  Location[facility];  Service: Cardiovascular;  Laterality: N/A;  . KNEE ARTHROSCOPY W/ ORIF     Left; "meniscus tear"  . KNEE SURGERY     left  . L/S films  11/21/01   nml; Right hip nml  . LASIK     left eye  . MANDIBLE SURGERY     "got about 6 pins in the bottom of my jaw"  . MANDIBLE SURGERY    . MRI Right Hip  12/12/01   Tendonitis Gluteus Medius to Gtr Trochanter Neg Bursitis (Dr. Eulas Post)  . MRI U/S  11/21/01   min. DDD L/3-4  . NCS  11/2014   L ulnar neuropathy, mild B median nerve entrapment ([place])  . OSTEOTOMY     Left foot  . PARTIAL COLECTOMY N/A 10/02/2013   PARTIAL COLECTOMY;  Surgeon: Imogene Burn. Tsuei, MD  . sinus surgery     x3 with balloon   . stress myoview  02/2011   no significant ischemia  . TOE AMPUTATION     "toe beside baby toe on left foot; got gangrene from corn"  . TONSILLECTOMY AND ADENOIDECTOMY    . US ECHOCARDIOGRAPHY  04/2011   mildly dilated LV, EF 25-30%, hypokinesis, restrictive physiology, mod MR, no AS    Family History: Family History  Problem Relation Age of Onset  . Lung cancer Father        + smoker  . Colon polyps Father   . Dementia Mother   . Osteoporosis Mother        Lumbar spine  . Stroke Mother        x 5 @ 80 YOA  . Arthritis Mother        hands  . Emphysema Mother   . Alcohol abuse Paternal Grandfather   . Stroke Paternal Grandfather        ?  Marland Kitchen Heart disease Paternal Grandfather   . Hypertension Maternal Grandfather        ?  Marland Kitchen Heart disease Paternal Grandmother   .  Colon cancer Neg Hx   . Esophageal cancer Neg Hx   . Gallbladder disease Neg Hx     Social History: Social History   Social History  . Marital status: Married    Spouse name: N/A  . Number of children: 1  . Years of education: N/A   Occupational History  . 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 [employer]    Social History Main Topics  . Smoking status: Never Smoker  . Smokeless tobacco: Never Used     Comment: Lived with smokers x 23 yrs, smoked herself "for a week"  . Alcohol use No  . Drug use: No  . Sexual activity: Not Currently   Other Topics Concern  . Not on file   Social History Narrative   1 adopted child      Lives with husband             Allergies: Allergies  Allergen Reactions  . Antihistamines, Chlorpheniramine-Type Other (See Comments)    Makes her eyes look like she sees strobe lights  . Codeine Other (See Comments)    Confusion and dizziness  . Cyclobenzaprine Other (See Comments)    Adverse reaction - back and throat pain, dizziness, exhaustion  . Cymbalta [Duloxetine Hcl] Other (See Comments)    "body spasms and made me feel weird in my head"   . Influenza Vac Split [Flu Virus Vaccine] Other (See Comments)    "allergic to concentrated eggs that are in vaccine"  . Prevnar [Pneumococcal 13-Val Conj Vacc] Other (See Comments)    Egg allergy Bad reaction after shot  . Sulfasalazine Other (See Comments)    "makes my whole body smell like sulfa & makes me sick just smelling it"  . Atorvastatin Other (See Comments)    Severe muscle cramps to generic atorvastatin.  Able to take brand lipitor.  . Chlorhexidine Other (See Comments)    blisters  . Dilaudid [Hydromorphone Hcl] Other (See Comments)    Feels like she is burning internally   . Latex Nausea Only, Rash and Other (See Comments)    Pain and infection  . Morphine And Related Other (See Comments)    Internally feels like she is on fire   . Pravastatin Other (See  Comments)    myalgias  . Prevacid [Lansoprazole] Other (See Comments)    Worsened GI side effects  . Red Dye Nausea Only and Swelling  . Rosuvastatin Other (See Comments)    Was not effective controlling lipids  . Simvastatin Other (See Comments)    Leg cramps  . Other     NOTE: pt is able to take cephalosporins without reaction  . Penicillins Other (See Comments)    REACTION: "told 50 years ago I couldn't take it; might be immune to it", able to take amoxicillin  . Sertraline Other (See Comments)    Overly sedating  . Tape Other (See Comments)    All adhesives  - Blisters, itching and burning   . Metaxalone Other (See Comments)    REACTION: unknown    Medications:  Current Outpatient Prescriptions  Medication Sig Dispense Refill  . albuterol (PROVENTIL HFA;VENTOLIN HFA) 108 (90 Base) MCG/ACT inhaler Inhale 2 puffs into the lungs every 6 (six) hours as needed for wheezing or shortness of breath. 1 Inhaler 0  . alendronate (FOSAMAX) 70 MG tablet Take 70 mg by mouth once a week. Patient takes every Thursday    . ALPRAZolam (XANAX) 0.5 MG tablet Take  0.5 tablets (0.25 mg total) by mouth at bedtime as needed for sleep. 30 tablet 1  . AMBULATORY NON FORMULARY MEDICATION Medication Name:  1 squatty potty to use as needed 1 each 0  . Ascorbic Acid (VITAMIN C) 1000 MG tablet Take 1,000 mg by mouth daily.    Marland Kitchen aspirin EC 81 MG tablet Take 81 mg by mouth daily.    Marland Kitchen buPROPion (WELLBUTRIN SR) 100 MG 12 hr tablet Take 1 tablet (100 mg total) by mouth daily. 30 tablet 3  . Calcium Carbonate-Vitamin D (CALCIUM + D PO) Take 1 tablet by mouth daily.    . carvedilol (COREG) 6.25 MG tablet TAKE 1 TABLET (6.25 MG TOTAL) BY MOUTH 2 (TWO) TIMES DAILY WITH A MEAL. 180 tablet 3  . cholecalciferol (VITAMIN D) 1000 units tablet Take 2,000 Units by mouth daily.    Marland Kitchen dextromethorphan (DELSYM) 30 MG/5ML liquid Take 15 mg by mouth at bedtime as needed for cough.    . ezetimibe (ZETIA) 10 MG tablet TAKE 1  TABLET (10 MG TOTAL) BY MOUTH DAILY. 30 tablet 5  . nystatin (MYCOSTATIN/NYSTOP) powder Apply topically 2 (two) times daily as needed. 30 g 0  . omeprazole (PRILOSEC) 40 MG capsule Take 1 capsule (40 mg total) by mouth daily.    . potassium chloride (K-DUR) 10 MEQ tablet Take 10 mEq by mouth as directed. Take daily as needed    . Pyridoxine HCl (VITAMIN B6 PO) Take 1 capsule by mouth daily. Reported on 07/12/2015    . Red Yeast Rice 600 MG CAPS Take 1 capsule (600 mg total) by mouth 2 (two) times daily.    Marland Kitchen spironolactone (ALDACTONE) 12.5 mg TABS tablet Take 12.5 mg by mouth daily.    Marland Kitchen tiotropium (SPIRIVA HANDIHALER) 18 MCG inhalation capsule Place 1 capsule (18 mcg total) into inhaler and inhale daily. 30 capsule 2  . torsemide (DEMADEX) 10 MG tablet Take 10 mg by mouth daily as needed (swelling/fluid).    . triamcinolone (NASACORT) 55 MCG/ACT AERO nasal inhaler Place 2 sprays into the nose 2 (two) times daily.    . valsartan (DIOVAN) 80 MG tablet Take 1 tablet (80 mg total) by mouth daily. 30 tablet 3  . vitamin E 100 UNIT capsule Take 100 Units by mouth daily.     No current facility-administered medications for this visit.     Review of Systems: Review of Systems  Constitutional: Positive for appetite change and fatigue. Negative for chills, diaphoresis, fever and unexpected weight change.       Poor appetite.  HENT:   Negative for lump/mass, mouth sores, nosebleeds, sore throat and trouble swallowing.   Eyes: Negative for eye problems and icterus.  Respiratory: Positive for cough and wheezing. Negative for hemoptysis and shortness of breath.   Cardiovascular: Positive for leg swelling. Negative for chest pain and palpitations.  Gastrointestinal: Positive for abdominal pain and diarrhea. Negative for blood in stool, constipation, nausea and vomiting.  Genitourinary: Negative for difficulty urinating, dysuria, hematuria and vaginal discharge.   Musculoskeletal: Positive for arthralgias,  back pain and myalgias.  Skin: Negative for itching and rash.  Neurological: Positive for headaches. Negative for dizziness, extremity weakness, light-headedness, numbness and seizures.  Hematological: Negative for adenopathy. Bruises/bleeds easily.  Psychiatric/Behavioral: Positive for depression. Negative for suicidal ideas.     PHYSICAL EXAMINATION Blood pressure (!) 102/53, pulse 91, temperature 97.9 F (36.6 C), temperature source Oral, resp. rate 18, height 5' 5.5" (1.664 m), weight 180 lb 3.2 oz (81.7 kg),  SpO2 98 %.  ECOG PERFORMANCE STATUS: 1 - Symptomatic but completely ambulatory  Physical Exam  Constitutional: She is oriented to person, place, and time and well-developed, well-nourished, and in no distress.  HENT:  Head: Atraumatic.  Right Ear: External ear normal.  Left Ear: External ear normal.  Mouth/Throat: Oropharynx is clear and moist. No oropharyngeal exudate.  Eyes: EOM are normal. Pupils are equal, round, and reactive to light. No scleral icterus.  Neck: No thyromegaly present.  Cardiovascular: Normal rate and regular rhythm.  Exam reveals no gallop.   No murmur heard. Pulmonary/Chest: No stridor. No respiratory distress. She has wheezes. She exhibits tenderness.  Abdominal: Soft. She exhibits no distension and no mass. There is tenderness. There is no rebound.  Musculoskeletal: She exhibits no edema or tenderness.  Some tenderness with possible presence of trigger points on palpation of the upper back and upper arms.  Lymphadenopathy:    She has no cervical adenopathy.  Neurological: She is alert and oriented to person, place, and time.  Skin: Skin is warm. No rash noted. She is not diaphoretic.     LABORATORY DATA: I have personally reviewed the data as listed: Hospital Outpatient Visit on 11/17/2016  Component Date Value Ref Range Status  . Specimen Description 11/17/2016 EXPECTORATED SPUTUM   Final  . Special Requests 11/17/2016 NONE   Final  . Sputum  evaluation 11/17/2016 THIS SPECIMEN IS ACCEPTABLE FOR SPUTUM CULTURE   Final  . Report Status 11/17/2016 11/17/2016 FINAL   Final  . Specimen Description 11/17/2016 EXPECTORATED SPUTUM   Final  . Special Requests 11/17/2016 NONE Reflexed from [id number]   Final  . Gram Stain 11/17/2016    Final                   Value:MODERATE WBC PRESENT, PREDOMINANTLY MONONUCLEAR MODERATE GRAM NEGATIVE RODS MODERATE GRAM POSITIVE COCCI IN PAIRS RARE GRAM POSITIVE RODS   . Culture 11/17/2016    Final                   Value:Consistent with normal respiratory flora. Performed at [facility], [place] [address], [place], [place] [address]   . Report Status 11/17/2016 11/19/2016 FINAL   Final  Clinical Support on 11/16/2016  Component Date Value Ref Range Status  . Date Time Interrogation Session 11/16/2016 [id number]   Final  . Pulse Generator Manufacturer 11/16/2016 MERM   Final  . Pulse Gen Model 11/16/2016 D224VRC Secura VR   Final  . Pulse Gen Serial Number 11/16/2016 [id number]   Final  . Clinic Name 11/16/2016 [facility]   Final  . Implantable Pulse Generator Type 11/16/2016 Implantable Cardiac Defibulator   Final  . Implantable Pulse Generator Implan* 11/16/2016 [id number]   Final  . Implantable Lead Manufacturer 11/16/2016 MERM   Final  . Implantable Lead Model 11/16/2016 6935 Sprint Quattro Secure S   Final  . Implantable Lead Serial Number 11/16/2016 [id number]   Final  . Implantable Lead Implant Date 11/16/2016 [id number]   Final  . Implantable Lead Location Detail 1 11/16/2016 APEX   Final  . Implantable Lead Location 11/16/2016 [id number]   Final  . Lead Channel Setting Sensing Sensi* 11/16/2016 0.3  mV Final  . Lead Channel Setting Pacing Pulse * 11/16/2016 0.4  ms Final  . Lead Channel Setting Pacing Amplit* 11/16/2016 2.5  V Final  . Lead Channel Impedance Value 11/16/2016 494  ohm Final  . Lead Channel Sensing Intrinsic Amp* 11/16/2016 6.875  mV Final  .  Lead Channel Sensing Intrinsic  Amp* 11/16/2016 6.875  mV Final  . Lead Channel Pacing Threshold Ampl* 11/16/2016 0.75  V Final  . Lead Channel Pacing Threshold Puls* 11/16/2016 0.4  ms Final  . HighPow Impedance 11/16/2016 80  ohm Final  . Battery Status 11/16/2016 OK   Final  . Battery Voltage 11/16/2016 3.01  V Final  . Brady Statistic RV Percent Paced 11/16/2016 0.01  % Final  . Eval Rhythm 11/16/2016 Vs   Final  Appointment on 11/16/2016  Component Date Value Ref Range Status  . WBC 11/16/2016 12.5* 3.9 - 10.3 10e3/uL Final  . NEUT# 11/16/2016 9.3* 1.5 - 6.5 10e3/uL Final  . HGB 11/16/2016 11.9  11.6 - 15.9 g/dL Final  . HCT 11/16/2016 37.2  34.8 - 46.6 % Final  . Platelets 11/16/2016 269  145 - 400 10e3/uL Final  . MCV 11/16/2016 91.6  79.5 - 101.0 fL Final  . MCH 11/16/2016 29.3  25.1 - 34.0 pg Final  . MCHC 11/16/2016 32.0  31.5 - 36.0 g/dL Final  . RBC 11/16/2016 4.06  3.70 - 5.45 10e6/uL Final  . RDW 11/16/2016 12.9  11.2 - 14.5 % Final  . lymph# 11/16/2016 2.1  0.9 - 3.3 10e3/uL Final  . MONO# 11/16/2016 0.9  0.1 - 0.9 10e3/uL Final  . Eosinophils Absolute 11/16/2016 0.2  0.0 - 0.5 10e3/uL Final  . Basophils Absolute 11/16/2016 0.0  0.0 - 0.1 10e3/uL Final  . NEUT% 11/16/2016 75.0  38.4 - 76.8 % Final  . LYMPH% 11/16/2016 16.5  14.0 - 49.7 % Final  . MONO% 11/16/2016 6.8  0.0 - 14.0 % Final  . EOS% 11/16/2016 1.5  0.0 - 7.0 % Final  . BASO% 11/16/2016 0.2  0.0 - 2.0 % Final  . IgG, Qn, Serum 11/16/2016 570* 700 - 1,600 mg/dL Final  . IgA, Qn, Serum 11/16/2016 144  64 - 422 mg/dL Final  . IgM, Qn, Serum 11/16/2016 1,224* 26 - 217 mg/dL Final   Comment: Results confirmed on dilution.   . Total Protein 11/16/2016 7.9  6.0 - 8.5 g/dL Final  . Albumin SerPl Elph-Mcnc 11/16/2016 4.0  2.9 - 4.4 g/dL Final  . Alpha 1 11/16/2016 0.2  0.0 - 0.4 g/dL Final  . Alpha2 Glob SerPl Elph-Mcnc 11/16/2016 1.0  0.4 - 1.0 g/dL Final  . B-Globulin SerPl Elph-Mcnc 11/16/2016 1.5* 0.7 - 1.3 g/dL Final  . Gamma Glob SerPl  Elph-Mcnc 11/16/2016 1.2  0.4 - 1.8 g/dL Final  . M Protein SerPl Elph-Mcnc 11/16/2016 Not Observed  Not Observed g/dL Final  . Globulin, Total 11/16/2016 3.9  2.2 - 3.9 g/dL Final  . Albumin/Glob SerPl 11/16/2016 1.1  0.7 - 1.7 Final  . IFE 1 11/16/2016 Comment   Final   Comment: An apparent polyclonal gammopathy: IgM. Kappa and lambda typing appear increased.   . Please Note 11/16/2016 Comment   Final   Comment: Protein electrophoresis scan will follow via computer, mail, or courier delivery.   Loletha Grayer Kappa Free Light Chain 11/16/2016 16.7  3.3 - 19.4 mg/L Final  . Ig Lambda Free Light Chain 11/16/2016 58.6* 5.7 - 26.3 mg/L Final  . Kappa/Lambda FluidC Ratio 11/16/2016 0.28  0.26 - 1.65 Final  . CMV IgM Ser EIA-aCnc 11/16/2016 <30.0  0.0 - 29.9 AU/mL Final   Comment:  Negative         <30.0                                                Equivocal  30.0 - 34.9                                                Positive         >34.9                A positive result is generally indicative of acute                infection, reactivation or persistent IgM production.   . CMV Ab - IgG 11/16/2016 <0.60  0.00 - 0.59 U/mL Final   Comment:                                               Negative          <0.60                                               Equivocal   0.60 - 0.69                                               Positive          >0.69   . EBV VCA IgG 11/16/2016 240.0* 0.0 - 17.9 U/mL Final   Comment:                                                 Negative        <18.0                                                 Equivocal 18.0 - 21.9                                                 Positive        >21.9   . EBV VCA IgM 11/16/2016 <36.0  0.0 - 35.9 U/mL Final   Comment:                                                 Negative        <  36.0                                                 Equivocal 36.0 - 43.9                                                  Positive        >43.9   . EBV Early Antigen Ab, IgG 11/16/2016 17.5* 0.0 - 8.9 U/mL Final   Comment:                                                 Negative        < 9.0                                                 Equivocal  9.0 - 10.9                                                 Positive        >10.9   . EBV NA IgG 11/16/2016 304.0* 0.0 - 17.9 U/mL Final   Comment:                                                 Negative        <18.0                                                 Equivocal 18.0 - 21.9                                                 Positive        >21.9   . Hep B Surface Ab, Qual 11/16/2016 Non Reactive   Final   Comment:                              Non Reactive: Inconsistent with immunity,                                            less than 10 mIU/mL  Reactive:     Consistent with immunity,                                            greater than 9.9 mIU/mL   . HBsAg Screen 11/16/2016 Negative  Negative Final  . Hep B Core Ab, Tot 11/16/2016 Negative  Negative Final  . HCV Ab 11/16/2016 <0.1  0.0 - 0.9 s/co ratio Final  . Comment: 11/16/2016 Comment   Final   Comment: Non reactive HCV antibody screen is consistent with no HCV infection, unless recent infection is suspected or other evidence exists to indicate HCV infection.   Marland Kitchen HIV Screen 4th Generation wRfx 11/16/2016 Non Reactive  Non Reactive Final  . ANA Titer 1 11/16/2016 Negative   Final   Comment:                                                     Negative   <1:80                                                     Borderline  1:80                                                     Positive   >1:80   . RA Latex Turbid. 11/16/2016 <10.0  0.0 - 13.9 IU/mL Final  Office Visit on 11/16/2016  Component Date Value Ref Range Status  . Sedimentation Rate-Westergren 11/16/2016 17  0 - 40 mm/hr Final  . CRP 11/16/2016 2.6  0.0 - 4.9 mg/L Final        Ardath Sax, MD

## 2016-11-24 ENCOUNTER — Encounter: Payer: Self-pay | Admitting: Cardiology

## 2016-11-24 ENCOUNTER — Telehealth: Payer: Self-pay | Admitting: Internal Medicine

## 2016-11-24 NOTE — Telephone Encounter (Signed)
Pt calling stating since being on this new inhaler she's had more issues  She's stating she's having more SOB/CP/coughing than before being on this She feels like there's pain in her chest like if it was pneumonia  She is tired a lot more  Would like advise on this Please call

## 2016-11-24 NOTE — Telephone Encounter (Signed)
R/T call to patient advised to d/c Spiriva and message would be forwarded to MD on call. Please advise.

## 2016-11-25 NOTE — Telephone Encounter (Signed)
Noted  Merton Border, MD PCCM service Mobile [phone] Pager [phone] 11/25/2016 10:58 AM

## 2016-12-05 ENCOUNTER — Ambulatory Visit: Payer: Medicare Other | Attending: Internal Medicine

## 2016-12-05 ENCOUNTER — Encounter: Payer: Self-pay | Admitting: Internal Medicine

## 2016-12-05 DIAGNOSIS — R0683 Snoring: Secondary | ICD-10-CM | POA: Diagnosis not present

## 2016-12-05 DIAGNOSIS — R400 Somnolence: Secondary | ICD-10-CM | POA: Diagnosis not present

## 2016-12-05 DIAGNOSIS — G4719 Other hypersomnia: Secondary | ICD-10-CM

## 2016-12-05 DIAGNOSIS — J40 Bronchitis, not specified as acute or chronic: Secondary | ICD-10-CM | POA: Diagnosis not present

## 2016-12-05 DIAGNOSIS — J418 Mixed simple and mucopurulent chronic bronchitis: Secondary | ICD-10-CM

## 2016-12-06 ENCOUNTER — Other Ambulatory Visit: Payer: Self-pay | Admitting: Internal Medicine

## 2016-12-06 ENCOUNTER — Ambulatory Visit (HOSPITAL_COMMUNITY)
Admission: RE | Admit: 2016-12-06 | Discharge: 2016-12-06 | Disposition: A | Discharge status: Home / Self Care | Payer: Medicare Other | Source: Ambulatory Visit | Attending: Hematology and Oncology | Admitting: Hematology and Oncology

## 2016-12-06 DIAGNOSIS — E8809 Other disorders of plasma-protein metabolism, not elsewhere classified: Secondary | ICD-10-CM | POA: Diagnosis not present

## 2016-12-06 DIAGNOSIS — R768 Other specified abnormal immunological findings in serum: Secondary | ICD-10-CM | POA: Diagnosis not present

## 2016-12-08 DIAGNOSIS — G4710 Hypersomnia, unspecified: Secondary | ICD-10-CM | POA: Diagnosis not present

## 2016-12-11 ENCOUNTER — Telehealth: Payer: Self-pay | Admitting: *Deleted

## 2016-12-11 NOTE — Telephone Encounter (Signed)
-----   Message from Laverle Hobby, MD sent at 12/08/2016  5:44 PM EDT ----- Regarding: sleep study results.  No sleep apnea found on this study.   Follow with referring physician .Educate patient in sleep hygiene measures .Minimize sedating medications.  .May benefit from trial of sedative at bedtime such as ambien 5 mg.

## 2016-12-11 NOTE — Telephone Encounter (Signed)
LMTCB

## 2016-12-14 ENCOUNTER — Telehealth: Payer: Self-pay | Admitting: Cardiovascular Disease

## 2016-12-14 NOTE — Telephone Encounter (Signed)
Pt calling to ask if it is okay for her to take garlic for she is on heart medication  Wants to make sure if won't interact with other issues she has  Please advise.

## 2016-12-14 NOTE — Telephone Encounter (Signed)
Ok to take garlic

## 2016-12-14 NOTE — Telephone Encounter (Signed)
S/w patient. She wants to make sure if it's advisable for her to take garlic per Dr [name]. Advised I would let him know and make her aware of his recommendation.

## 2016-12-15 NOTE — Telephone Encounter (Signed)
Left message for pt w/ Dr. Donivan Scull recommendation.  Asked her to cal back w/ any further questions or concerns.

## 2016-12-15 NOTE — Telephone Encounter (Signed)
Patient confirmed she received the below msg.  She is aware it is ok to take garlic.

## 2016-12-19 ENCOUNTER — Telehealth: Payer: Self-pay | Admitting: Internal Medicine

## 2016-12-19 NOTE — Telephone Encounter (Signed)
Pt scheduled for 01/01/17 to see Dr Ashby Dawes  Nothing else needed.

## 2016-12-19 NOTE — Telephone Encounter (Signed)
If pt wants to schedule sooner appt please schedule at next available.

## 2016-12-19 NOTE — Telephone Encounter (Signed)
Pt calling asking to be seen sooner than her Oct she has scheduled with Dr Ashby Dawes  Last time she was here she states he placed her on a new inhaler  She states she stopped taking that a while ago for it wasn't working well with her Since then she's been having more SOB sharpe pains in her lower lung.  She is concerned and would like to know if she can come in sooner   Please advise.

## 2016-12-20 ENCOUNTER — Telehealth: Payer: Self-pay | Admitting: Cardiovascular Disease

## 2016-12-20 NOTE — Telephone Encounter (Signed)
Patient received notice from pharmacy that Valsartan was recalled .  Please call to discuss.

## 2016-12-21 MED ORDER — LOSARTAN POTASSIUM 50 MG PO TABS: 50.0000 mg | ORAL_TABLET | Freq: Every day | ORAL | 6 refills | Status: DC

## 2016-12-21 NOTE — Telephone Encounter (Signed)
Ok to switch to losartan 50 mg daily

## 2016-12-21 NOTE — Telephone Encounter (Signed)
Spoke with patient and she was very upset about this recall because she has been sent to hematologist to rule out cancer due to her protein levels being abnormal. She feels that this has been studied for a long time and that the physicians should have know about the investigation and potential risks. Advised her that I am sorry for her concerns and that I will be in touch with her on substitute medication for her recalled valsartan. She verbalized understanding with no further questions.

## 2016-12-21 NOTE — Telephone Encounter (Signed)
Reviewed medication changes and instructed her to start Losartan 50 mg once daily. She verbalized understanding and had no further questions at this time. Medication sent in to her pharmacy of choice.

## 2016-12-25 ENCOUNTER — Encounter: Payer: Self-pay | Admitting: Internal Medicine

## 2016-12-29 NOTE — Progress Notes (Signed)
[facility] Consultation      Assessment and Plan:  Excessive daytime sleepiness.  -Sleep study negative. --Mild insomnia, discussed restarting Lorrin Mais however she has a red dye allergy, therefore will hold off.   Non-ischemic cardiomyopathy.  -May contribute to daytime fatigue as well as a central sleep apnea. Will send for sleep study.  Chronic systolic congestive heart failure.  --EF=40%   Chronic bronchitis.  -Chronic production of green sputum, this appears to be consistent with chronic bronchitis. -Started on Spiriva at last visit,  to see if we can reduce the episodes of exacerbation, as well as  mucus production.  Emphysema --Seen on CXR.     Date: 12/29/2016  MRN# [id number] Ashley [name] 14-Jun-1945    Ashley [name] is a 71 y.o. old female seen in consultation for chief complaint of:    Chief Complaint  Patient presents with  . Follow-up    Sleep Study /PFT 02/06/17  . Cough    d/c Spiriva due to increasing cough.    HPI:   The patient is a 71 year old female, she was referred for symptoms of excessive daytime sleepiness. She had a sleep study which was negative. She was started on spiriva for chronic bronchitis with frequent flares, sputum Cx 11/17/16 was negative. She stopped spiriva due to cough. She was sent for a sleep study which was negative for objective sleep apnea with an AHI of 2.1, and showed frequent arousals, poor sleep efficiency. She has been feeling some sharp pain in her left lung and feels it has been better since being off of spiriva.  She notes that she has to clear phlegm out of her lungs every morning.   She has a lot of pain from that and fibromyalgia.    She takes nasacort for rhinitis.  She has a rescue inhaler which she uses as needed.  **Echo 08/07/16; EF=40% CBC 10/27/16; Eos ranges from 100-300.  **Sleep study 12/05/2016; negative for sleep apnea, snoring with insomnia with poor sleep efficiency and  frequent arousals. **i personally reviewed CXR films 11/14/16; mild hyperinflation c/w mild emphysema.   Medication:    Current Outpatient Prescriptions:  .  albuterol (PROVENTIL HFA;VENTOLIN HFA) 108 (90 Base) MCG/ACT inhaler, Inhale 2 puffs into the lungs every 6 (six) hours as needed for wheezing or shortness of breath., Disp: 1 Inhaler, Rfl: 0 .  alendronate (FOSAMAX) 70 MG tablet, Take 70 mg by mouth once a week. Patient takes every Thursday, Disp: , Rfl:  .  ALPRAZolam (XANAX) 0.5 MG tablet, Take 0.5 tablets (0.25 mg total) by mouth at bedtime as needed for sleep., Disp: 30 tablet, Rfl: 1 .  AMBULATORY NON FORMULARY MEDICATION, Medication Name:  1 squatty potty to use as needed, Disp: 1 each, Rfl: 0 .  Ascorbic Acid (VITAMIN C) 1000 MG tablet, Take 1,000 mg by mouth daily., Disp: , Rfl:  .  aspirin EC 81 MG tablet, Take 81 mg by mouth daily., Disp: , Rfl:  .  buPROPion (WELLBUTRIN SR) 100 MG 12 hr tablet, Take 1 tablet (100 mg total) by mouth daily., Disp: 30 tablet, Rfl: 3 .  Calcium Carbonate-Vitamin D (CALCIUM + D PO), Take 1 tablet by mouth daily., Disp: , Rfl:  .  carvedilol (COREG) 6.25 MG tablet, TAKE 1 TABLET (6.25 MG TOTAL) BY MOUTH 2 (TWO) TIMES DAILY WITH A MEAL., Disp: 180 tablet, Rfl: 3 .  cholecalciferol (VITAMIN D) 1000 units tablet, Take 2,000 Units by mouth daily., Disp: , Rfl:  .  dextromethorphan (DELSYM) 30 MG/5ML liquid, Take 15 mg by mouth at bedtime as needed for cough., Disp: , Rfl:  .  ezetimibe (ZETIA) 10 MG tablet, TAKE 1 TABLET (10 MG TOTAL) BY MOUTH DAILY., Disp: 30 tablet, Rfl: 5 .  losartan (COZAAR) 50 MG tablet, Take 1 tablet (50 mg total) by mouth daily., Disp: 30 tablet, Rfl: 6 .  nystatin (MYCOSTATIN/NYSTOP) powder, Apply topically 2 (two) times daily as needed., Disp: 30 g, Rfl: 0 .  omeprazole (PRILOSEC) 40 MG capsule, Take 1 capsule (40 mg total) by mouth daily., Disp: , Rfl:  .  potassium chloride (K-DUR) 10 MEQ tablet, Take 10 mEq by mouth as directed.  Take daily as needed, Disp: , Rfl:  .  Pyridoxine HCl (VITAMIN B6 PO), Take 1 capsule by mouth daily. Reported on 07/12/2015, Disp: , Rfl:  .  Red Yeast Rice 600 MG CAPS, Take 1 capsule (600 mg total) by mouth 2 (two) times daily., Disp: , Rfl:  .  spironolactone (ALDACTONE) 12.5 mg TABS tablet, Take 12.5 mg by mouth daily., Disp: , Rfl:  .  tiotropium (SPIRIVA HANDIHALER) 18 MCG inhalation capsule, Place 1 capsule (18 mcg total) into inhaler and inhale daily., Disp: 30 capsule, Rfl: 2 .  torsemide (DEMADEX) 10 MG tablet, Take 10 mg by mouth daily as needed (swelling/fluid)., Disp: , Rfl:  .  triamcinolone (NASACORT) 55 MCG/ACT AERO nasal inhaler, Place 2 sprays into the nose 2 (two) times daily., Disp: , Rfl:  .  vitamin E 100 UNIT capsule, Take 100 Units by mouth daily., Disp: , Rfl:    Allergies:  Antihistamines, chlorpheniramine-type; Codeine; Cyclobenzaprine; Cymbalta [duloxetine hcl]; Influenza vac split [flu virus vaccine]; Prevnar [pneumococcal 13-val conj vacc]; Sulfasalazine; Atorvastatin; Chlorhexidine; Dilaudid [hydromorphone hcl]; Latex; Morphine and related; Pravastatin; Prevacid [lansoprazole]; Red dye; Rosuvastatin; Simvastatin; Other; Penicillins; Sertraline; Tape; and Metaxalone  Review of Systems: Gen:  Denies  fever, sweats, chills HEENT: Denies blurred vision, double vision. bleeds, sore throat Cvc:  No dizziness, chest pain. Resp:   Denies cough, has constant mucus and sputum production. Gi: Denies swallowing difficulty, stomach pain. Gu:  Denies bladder incontinence, burning urine Ext:   No Joint pain, stiffness. Skin: No skin rash,  hives  Endoc:  No polyuria, polydipsia. Psych: No depression, insomnia. Other:  All other systems were reviewed with the patient and were negative other that what is mentioned in the HPI.   Physical Examination:   VS: BP 110/70 (BP Location: Left Arm, Cuff Size: Normal)   Pulse 84   Resp 16   Ht 5' 5.5" (1.664 m)   Wt 178 lb (80.7  kg)   SpO2 96%   BMI 29.17 kg/m   General Appearance: No distress  Neuro:without focal findings,  speech normal,  HEENT: PERRLA, EOM intact.  Pinpoint tenderness in anterior axillary line around 8th rib. Very tender.  Pulmonary: normal breath sounds, No wheezing.  CardiovascularNormal S1,S2.  No m/r/g.   Abdomen: Benign, Soft, non-tender. Renal:  No costovertebral tenderness  GU:  No performed at this time. Endoc: No evident thyromegaly, no signs of acromegaly. Skin:   warm, no rashes, no ecchymosis  Extremities: normal, no cyanosis, clubbing.  Other findings:    LABORATORY PANEL:   CBC No results for input(s): WBC, HGB, HCT, PLT in the last 168 hours. ------------------------------------------------------------------------------------------------------------------  Chemistries  No results for input(s): NA, K, CL, CO2, GLUCOSE, BUN, CREATININE, CALCIUM, MG, AST, ALT, ALKPHOS, BILITOT in the last 168 hours.  Invalid input(s): GFRCGP ------------------------------------------------------------------------------------------------------------------  Cardiac Enzymes  No results for input(s): TROPONINI in the last 168 hours. ------------------------------------------------------------  RADIOLOGY:  No results found.     Thank  you for the consultation and for allowing [facility] to assist in the care of your patient. Our recommendations are noted above.  Please contact us if we can be of further service.   Marda Stalker, MD.  Board Certified in Internal Medicine, Pulmonary Medicine, [place], and Sleep Medicine.  [facility] Office Number: [phone]  Ashley [name], M.D.  Ashley [name], M.D  12/29/2016

## 2017-01-01 ENCOUNTER — Ambulatory Visit (INDEPENDENT_AMBULATORY_CARE_PROVIDER_SITE_OTHER): Payer: Medicare Other | Admitting: Internal Medicine

## 2017-01-01 ENCOUNTER — Encounter: Payer: Self-pay | Admitting: Internal Medicine

## 2017-01-01 VITALS — BP 110/70 | HR 84 | Resp 16 | Ht 65.5 in | Wt 178.0 lb

## 2017-01-01 DIAGNOSIS — J418 Mixed simple and mucopurulent chronic bronchitis: Secondary | ICD-10-CM

## 2017-01-01 MED ORDER — FLUTICASONE-SALMETEROL 250-50 MCG/DOSE IN AEPB: 1.0000 | INHALATION_SPRAY | Freq: Two times a day (BID) | RESPIRATORY_TRACT | 11 refills | Status: DC

## 2017-01-01 MED ORDER — FLUTICASONE-SALMETEROL 55-14 MCG/ACT IN AEPB: 2.0000 | INHALATION_SPRAY | Freq: Two times a day (BID) | RESPIRATORY_TRACT | 11 refills | Status: DC

## 2017-01-01 NOTE — Patient Instructions (Addendum)
--  Stop spiriva, start advair.  Marland Kitchen

## 2017-01-03 ENCOUNTER — Telehealth: Payer: Self-pay | Admitting: Internal Medicine

## 2017-01-03 DIAGNOSIS — L304 Erythema intertrigo: Secondary | ICD-10-CM | POA: Diagnosis not present

## 2017-01-03 DIAGNOSIS — L570 Actinic keratosis: Secondary | ICD-10-CM | POA: Diagnosis not present

## 2017-01-03 NOTE — Telephone Encounter (Signed)
PT was prescribed Fluticasone-Salmeterol inhaler When pt was reviewing medication it states that those with weak bone issues should not use Pt has had bone density and would like to see if there is anything else to prescribe Please call to advise

## 2017-01-05 ENCOUNTER — Ambulatory Visit (INDEPENDENT_AMBULATORY_CARE_PROVIDER_SITE_OTHER): Payer: Medicare Other | Admitting: Internal Medicine

## 2017-01-05 ENCOUNTER — Encounter: Payer: Self-pay | Admitting: Internal Medicine

## 2017-01-05 VITALS — BP 118/78 | HR 63 | Ht 65.5 in | Wt 179.4 lb

## 2017-01-05 DIAGNOSIS — I5022 Chronic systolic (congestive) heart failure: Secondary | ICD-10-CM

## 2017-01-05 DIAGNOSIS — I420 Dilated cardiomyopathy: Secondary | ICD-10-CM

## 2017-01-05 LAB — CUP PACEART INCLINIC DEVICE CHECK
Battery Voltage: 3.01 V
Brady Statistic RV Percent Paced: 0.01 %
Date Time Interrogation Session: 20180824162433
HighPow Impedance: 87 Ohm
Implantable Lead Implant Date: 20130301
Implantable Lead Location: 753860
Implantable Lead Model: 6935
Implantable Pulse Generator Implant Date: 20130301
Lead Channel Impedance Value: 589 Ohm
Lead Channel Pacing Threshold Amplitude: 0.5 V
Lead Channel Pacing Threshold Pulse Width: 0.4 ms
Lead Channel Sensing Intrinsic Amplitude: 6.25 mV
Lead Channel Sensing Intrinsic Amplitude: 8.25 mV
Lead Channel Setting Pacing Amplitude: 2.5 V
Lead Channel Setting Pacing Pulse Width: 0.4 ms
Lead Channel Setting Sensing Sensitivity: 0.3 mV

## 2017-01-05 NOTE — Patient Instructions (Signed)
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
## 2017-01-08 NOTE — Telephone Encounter (Signed)
[email or web address]

## 2017-01-08 NOTE — Telephone Encounter (Signed)
Can switch to Anoro once daily.

## 2017-01-09 MED ORDER — UMECLIDINIUM-VILANTEROL 62.5-25 MCG/INH IN AEPB: 1.0000 | INHALATION_SPRAY | Freq: Every day | RESPIRATORY_TRACT | 5 refills | Status: DC

## 2017-01-09 NOTE — Telephone Encounter (Signed)
Patient aware of inhaler change to Anoro.

## 2017-01-10 ENCOUNTER — Encounter: Payer: Medicare Other | Admitting: Internal Medicine

## 2017-01-13 LAB — HM MAMMOGRAPHY: HM Mammogram: NORMAL (ref 0–4)

## 2017-01-17 ENCOUNTER — Telehealth: Payer: Self-pay

## 2017-01-17 DIAGNOSIS — R252 Cramp and spasm: Secondary | ICD-10-CM

## 2017-01-17 NOTE — Telephone Encounter (Signed)
Pt is concerned Anoro that pulmonologist started may affect her K. Pt wants to know if could have K level cked. Since starting Anoro 3 weeks ago Pt having some symptoms; pt having leg cramps and calf is sore; at times difficult to walk due to calf being sore. Pt request cb.

## 2017-01-17 NOTE — Telephone Encounter (Signed)
sure - may come in for potassium check at her convenience.  Ensure no leg swelling or redness or warmth with calf soreness.

## 2017-01-18 ENCOUNTER — Other Ambulatory Visit: Payer: Self-pay | Admitting: Cardiovascular Disease

## 2017-01-18 NOTE — Telephone Encounter (Signed)
Spoke to pt and scheduled lab appt for 9/7

## 2017-01-19 ENCOUNTER — Other Ambulatory Visit (INDEPENDENT_AMBULATORY_CARE_PROVIDER_SITE_OTHER): Payer: Medicare Other

## 2017-01-19 DIAGNOSIS — R252 Cramp and spasm: Secondary | ICD-10-CM | POA: Diagnosis not present

## 2017-01-19 LAB — POTASSIUM: Potassium: 4.2 mEq/L (ref 3.5–5.1)

## 2017-01-31 DIAGNOSIS — L304 Erythema intertrigo: Secondary | ICD-10-CM | POA: Diagnosis not present

## 2017-01-31 DIAGNOSIS — L821 Other seborrheic keratosis: Secondary | ICD-10-CM | POA: Diagnosis not present

## 2017-02-02 DIAGNOSIS — Z124 Encounter for screening for malignant neoplasm of cervix: Secondary | ICD-10-CM | POA: Diagnosis not present

## 2017-02-02 DIAGNOSIS — M810 Age-related osteoporosis without current pathological fracture: Secondary | ICD-10-CM | POA: Diagnosis not present

## 2017-02-02 DIAGNOSIS — Z1231 Encounter for screening mammogram for malignant neoplasm of breast: Secondary | ICD-10-CM | POA: Diagnosis not present

## 2017-02-03 NOTE — Progress Notes (Addendum)
Cardiology Office Note  Date:  02/05/2017   ID:  Ashley [name], DOB [birth year], MRN [id number]  PCP:  [name], MD   Chief Complaint  Patient presents with  . other    6 month follow up. Pt. c/o weakness and shortness of breath.  Meds reviewed by the pt. verbally.    HPI:  71 y/o female with history of  Nonischemic cardiomyopathy fibromyalgia, irritable bowel,  pneumonia, chronic cough felt secondary to GERD, abnormal PFTs in the past,  shortness of breath dating back to September 2012 that presented acutely,  [facility] showed no significant ischemia,  She received pneumonia vaccine January 2017, had severe reaction previous abdominal surgery May 2015 She presents for follow-up today for follow-up of her Nonischemic cardiomyopathy  PNA in 2012 October 2012 Cardiomyopathy ejection fraction  25-35%,  Echo September 2013 with ejection fraction 30-35%, ICD placed EF 50 to 55% in 02/2013, 12/15, And November 2016 PNA vaccine 05/2015 EF 25 to 30% in 06/2015 EF 35 to 40% in 12/2016 EF 40 to 45% in 07/2016   Still with Chest pain, ABD pain, chronic issues  Went to [facility], was cloudy, snowing  Seen by pulmonary, Told that she Has "chronic bronchitis, COPD". Always coughing up green phlegm  Deconditioned, Gets very tired Sometimes has days with extreme exhaution  No significant ankle swelling,  she is taking torsemide sparingly  Long discussion concerning Bone marrow issue polyclonal gammopathy Additional workup pending  EKG reviewed by myself on today's visit NSR rate 82 with no significant ST or T-wave changes, left axis deviation   Other past medical history reviewed Previously participated in cardiac rehabilitation, pulmonary rehabilitation, has not maintained her exercise  Did not tolerate entresto (" too expensive")  Previous Gi problems, cysts ("too much bad bacteria" in her gut) Took ABX 2 weeks Abdomen still giving her trouble  but improved  Problems with arthritis, received numerous cortisone shots in her hands  On a previous office visit, we started her on entresto low-dose twice a day for systolic dysfunction.She took the medication until she ran out of the medication on her recent cruise 2-3 weeks agoReports that she did not feel well on the cruise, slept many hours per day, had leg discomfortShe stopped the medication then reports that she felt better when she got home. She does not want to retry the extresto. Also reports it was very expensive for her, $179 per month  Recent stress test showed perfusion abnormality to the anterior wall, significant artifact , attenuation issue secondary to left bundle branch block Cardiogram with ejection fraction 30-35%  Possible severe reaction to pneumonia shot with what sounds like viral symptoms, Requiring prednisone, antibiotics.  chronic abdominal pain, stress from her grandchildren. Previousid ultrasound showing minimal plaque on the left less than 39%, none on the right Previous dehydration with overuse of torsemide Eating more, no regular exercise, sedentary in general secondary to fibromyalgia and fatigue  Previous admission to [facility] in December 2012 for bronchitis and hypoxia as well as systolic CHF. She had antibiotics, prednisone and diuresis and has felt better since discharge. ACE inhibitor placed on hold secondary to chronic cough.  S/P ICD 10 days ago for EF <35%. Goal weight had been in the low 150 range. Previous Cardiac MRI showed ejection fraction 34 % ICD placed by Dr. Lovena Le.  Previous symptoms of malaise and fatigue, tachycardia, weight gain, leg swelling . previously taking high doses of diuretics for weight gain causing renal dysfunction, dehydration.  For creatinine 1.9 we suggested she cut back on her diuretics. Prior weight was in the low 160 pound range  History of diverticulosis, abscess formation, perforation  requiring a long course of antibiotics with PICC line, subsequent CT scan Sep 30 2013 showing free air, abscess requiring partial colectomy. She has an ostomy. Today she has lost 22 pounds by her report.   Previous echocardiogram shows improvement in her ejection fraction up to 50%. Challenging image quality. On my own review, it does appear at least 45, if not 50%.   PMH:   has a past medical history of Abdominal aortic atherosclerosis ([place]) (11/2015); Allergy; Anemia; Anxiety; Automatic implantable cardioverter-defibrillator in situ (2012); Bronchiectasis; Cancer ([place]); Chest pain; Chronic sinusitis; Chronic systolic CHF (congestive heart failure) ([place]); Depression; Diarrhea (07/14/2008); Disorder of vocal cords; Essential hypertension; Fibrocystic breast disease; Fibromyalgia; GERD (gastroesophageal reflux disease); H/O multiple allergies (10/10/2013); Hemorrhoids; History of diverticulitis of colon; History of pneumonia (2012); Insomnia; Irritable bowel syndrome; Laryngeal nodule; Lung disease (07/14/11); Migraine without aura, without mention of intractable migraine without mention of status migrainosus; Mitral regurgitation; Nonischemic cardiomyopathy ([place]); Osteoarthritis; Osteoporosis (01/2013); Pneumonia (2011); PONV (postoperative nausea and vomiting); Pre-syncope; Pure hypercholesterolemia; sigmoid diverticulitis with perforation, abscess and fistula (09/30/2013); and Sleep apnea (2010).  PSH:    Past Surgical History:  Procedure Laterality Date  . ABDOMINAL HYSTERECTOMY  1987   w/BSO  . ANAL RECTAL MANOMETRY N/A 02/02/2016   Procedure: ANO RECTAL MANOMETRY;  Surgeon: Mauri Pole, MD;  Location: [facility];  Service: Endoscopy;  Laterality: N/A;  . APPENDECTOMY    . AUGMENTATION MAMMAPLASTY    . BREAST BIOPSY     "I've had 2-3 bx; all benign"  . breast cystectomies     due to FCBD  . BREAST MASS EXCISION  01/2010   Left-fibrocystic change w/intraductal papilloma, no malignancy  .  carotid US  69/6295   2-84% LICA, normal RICA  . [employer] Bunionectomy  11/04/08   Right Great Toe (Dr. Beola Cord)  . COLONOSCOPY  1998   nml (sigmoid-Gilbert-nol)  . Colonoscopy  01/04/04   polyps, diverticulosis/severe  . COLOSTOMY N/A 10/02/2013   DESCENDING COLOSTOMY;  Surgeon: Imogene Burn. Tsuei, MD  . Derrill Memo REVERSAL N/A 02/10/2014   [name], MD  . CT chest  12/2010   possible bronchiectasis lower lobes, 02/2011 - enlarged bilateral effusions, bibasilar atx  . CT chest  04/2011   no PE.  abnormal R hilar and mediastinal adenopathy --> on rpt 05/2011, resolved  . DEXA  03/28/99   osteopenia L/S  . DEXA  03/12/03   2.2 Spine 0.1 Hip  Osteopenia  . DEXA  11/14/05   1.9 Spine 0.3 Hip  slight improvement  . DEXA  11/22/06   improved osteo and fem neck  . DEXA  01/2013   T score -2.6 at spine  . EMG/MCV  10/16/01   + mild carpal tunnel  . ESOPHAGOGASTRODUODENOSCOPY  09/2014   small HH, irregular Z line, fundic gland polyps with mild gastritis ([place])  . Flex laryngoscopy  06/11/06   ([place]) nml  . Head MRI, Head CT  10/1998   ? H/A focus ([place])  . ICD placement  07/14/11   w/pacemaker  . IMPLANTABLE CARDIOVERTER DEFIBRILLATOR IMPLANT N/A 07/14/2011   IMPLANTABLE CARDIOVERTER DEFIBRILLATOR IMPLANT;  Surgeon: Evans Lance, MD;  Location: [facility] C[facility] LAB;  Service: Cardiovascular;  Laterality: N/A;  . KNEE ARTHROSCOPY W/ ORIF     Left; "meniscus tear"  . KNEE SURGERY  left  . L/S films  11/21/01   nml; Right hip nml  . LASIK     left eye  . MANDIBLE SURGERY     "got about 6 pins in the bottom of my jaw"  . MANDIBLE SURGERY    . MRI Right Hip  12/12/01   Tendonitis Gluteus Medius to Gtr Trochanter Neg Bursitis (Dr. Eulas Post)  . MRI U/S  11/21/01   min. DDD L/3-4  . NCS  11/2014   L ulnar neuropathy, mild B median nerve entrapment ([place])  . OSTEOTOMY     Left foot  . PARTIAL COLECTOMY N/A 10/02/2013   PARTIAL COLECTOMY;  Surgeon: Imogene Burn. Tsuei, MD  . sinus surgery     x3  with balloon   . stress myoview  02/2011   no significant ischemia  . TOE AMPUTATION     "toe beside baby toe on left foot; got gangrene from corn"  . TONSILLECTOMY AND ADENOIDECTOMY    . US ECHOCARDIOGRAPHY  04/2011   mildly dilated LV, EF 25-30%, hypokinesis, restrictive physiology, mod MR, no AS    Current Outpatient Prescriptions  Medication Sig Dispense Refill  . albuterol (PROVENTIL HFA;VENTOLIN HFA) 108 (90 Base) MCG/ACT inhaler Inhale 2 puffs into the lungs every 6 (six) hours as needed for wheezing or shortness of breath. 1 Inhaler 0  . alendronate (FOSAMAX) 70 MG tablet Take 70 mg by mouth once a week. Patient takes every Thursday    . ALPRAZolam (XANAX) 0.5 MG tablet Take 0.5 tablets (0.25 mg total) by mouth at bedtime as needed for sleep. 30 tablet 1  . AMBULATORY NON FORMULARY MEDICATION Medication Name:  1 squatty potty to use as needed 1 each 0  . Ascorbic Acid (VITAMIN C) 1000 MG tablet Take 1,000 mg by mouth daily.    Marland Kitchen aspirin EC 81 MG tablet Take 81 mg by mouth daily.    . Calcium Carbonate-Vitamin D (CALCIUM + D PO) Take 1 tablet by mouth daily.    . carvedilol (COREG) 6.25 MG tablet TAKE 1 TABLET (6.25 MG TOTAL) BY MOUTH 2 (TWO) TIMES DAILY WITH A MEAL. 180 tablet 3  . cholecalciferol (VITAMIN D) 1000 units tablet Take 2,000 Units by mouth daily.    Marland Kitchen dextromethorphan (DELSYM) 30 MG/5ML liquid Take 15 mg by mouth at bedtime as needed for cough.    . ezetimibe (ZETIA) 10 MG tablet TAKE 1 TABLET (10 MG TOTAL) BY MOUTH DAILY. 30 tablet 5  . losartan (COZAAR) 50 MG tablet Take 1 tablet (50 mg total) by mouth daily. 30 tablet 6  . nystatin (MYCOSTATIN/NYSTOP) powder Apply topically 2 (two) times daily as needed. 30 g 0  . omeprazole (PRILOSEC) 40 MG capsule Take 1 capsule (40 mg total) by mouth daily.    . potassium chloride (K-DUR) 10 MEQ tablet Take 10 mEq by mouth as directed. Take daily as needed    . Pyridoxine HCl (VITAMIN B6 PO) Take 1 capsule by mouth daily.  Reported on 07/12/2015    . Red Yeast Rice 600 MG CAPS Take 1 capsule (600 mg total) by mouth 2 (two) times daily.    Marland Kitchen spironolactone (ALDACTONE) 25 MG tablet TAKE 1 TABLET BY MOUTH DAILY 90 tablet 3  . torsemide (DEMADEX) 10 MG tablet Take 10 mg by mouth daily as needed (swelling/fluid).    . triamcinolone (NASACORT) 55 MCG/ACT AERO nasal inhaler Place 2 sprays into the nose 2 (two) times daily.    Marland Kitchen umeclidinium-vilanterol (ANORO ELLIPTA) 62.5-25 MCG/INH  AEPB Inhale 1 puff into the lungs daily. 60 each 5  . vitamin E 100 UNIT capsule Take 100 Units by mouth daily.     No current facility-administered medications for this visit.      Allergies:   Antihistamines, chlorpheniramine-type; Codeine; Cyclobenzaprine; Cymbalta [duloxetine hcl]; Influenza vac split [flu virus vaccine]; Prevnar [pneumococcal 13-val conj vacc]; Sulfasalazine; Atorvastatin; Chlorhexidine; Dilaudid [hydromorphone hcl]; Latex; Morphine and related; Pravastatin; Prevacid [lansoprazole]; Red dye; Rosuvastatin; Simvastatin; Other; Penicillins; Sertraline; Tape; and Metaxalone   Social History:  The patient  reports that she has never smoked. She has never used smokeless tobacco. She reports that she does not drink alcohol or use drugs.   Family History:   family history includes Alcohol abuse in her paternal grandfather; Arthritis in her mother; Colon polyps in her father; Dementia in her mother; Emphysema in her mother; Heart disease in her paternal grandfather and paternal grandmother; Hypertension in her maternal grandfather; Lung cancer in her father; Osteoporosis in her mother; Stroke in her mother and paternal grandfather.    Review of Systems: Review of Systems  Constitutional: Positive for malaise/fatigue.  Respiratory: Negative.   Cardiovascular: Positive for chest pain.  Gastrointestinal: Positive for abdominal pain.  Musculoskeletal: Negative.        Legs are sore to touch  Neurological: Positive for weakness.   Psychiatric/Behavioral: Negative.   All other systems reviewed and are negative.    PHYSICAL EXAM: VS:  BP 100/62 (BP Location: Left Arm, Patient Position: Sitting, Cuff Size: Normal)   Pulse 82   Ht 5\' 5"  (1.651 m)   Wt 178 lb 4 oz (80.9 kg)   BMI 29.66 kg/m  , BMI Body mass index is 29.66 kg/m. GEN: Well nourished, well developed, in no acute distress , obese HEENT: normal  Neck: no JVD, carotid bruits, or masses Cardiac: RRR; no murmurs, rubs, or gallops,no edema  Respiratory:  clear to auscultation bilaterally, normal work of breathing GI: soft, nontender, nondistended, + BS MS: no deformity or atrophy  Skin: warm and dry, no rash Neuro:  Strength and sensation are intact Psych: euthymic mood, full affect     Recent Labs: 05/23/2016: TSH 1.24 10/27/2016: ALT 27; BUN 18; Creatinine, Ser 1.25; Sodium 138 11/16/2016: HGB 11.9; Platelets 269 01/19/2017: Potassium 4.2    Lipid Panel Lab Results  Component Value Date   CHOL 240 (H) 09/20/2016   HDL 44.10 09/20/2016   LDLCALC 109 (H) 05/27/2014   TRIG 224.0 (H) 09/20/2016      Wt Readings from Last 3 Encounters:  02/05/17 178 lb 4 oz (80.9 kg)  01/05/17 179 lb 6.4 oz (81.4 kg)  01/01/17 178 lb (80.7 kg)    12/2015: weight 171   ASSESSMENT AND PLAN:  Chronic systolic CHF (congestive heart failure) (HCC) -  No changes to her medication regimen Previous echo with  slight improvement of her ejection fraction Did not tolerate entresto. Found it  Expensive Continue current medications carvedilol losartan Aldactone   Cardiomyopathy, dilated, nonischemic (HCC) - No further testing needed, continue current medications Tolerating medications without significant side effects   Essential hypertension - Plan: EKG 12-Lead, ECHOCARDIOGRAM COMPLETE No medication changes made, continue medications as above   ICD (implantable cardioverter-defibrillator) in place -  Followed by Dr. Lovena Le.  Has pain around ICD site    Chronic fatigue Likely multifactorial, sedentary condition, fibromyalgia Recommended regular exercise program Gets worn out quickly with overdoing it   LBBB (left bundle branch block) Prior EKG showing left bundle branch block,  Total encounter time more than 25 minutes  Greater than 50% was spent in counseling and coordination of care with the patient   Disposition:   F/U  12 months   No orders of the defined types were placed in this encounter.    Signed, Esmond Plants, M.D., Ph.D. 02/05/2017  [place] [phone]\

## 2017-02-05 ENCOUNTER — Ambulatory Visit (INDEPENDENT_AMBULATORY_CARE_PROVIDER_SITE_OTHER): Payer: Medicare Other | Admitting: Cardiovascular Disease

## 2017-02-05 ENCOUNTER — Encounter: Payer: Self-pay | Admitting: Cardiovascular Disease

## 2017-02-05 VITALS — BP 100/62 | HR 82 | Ht 65.0 in | Wt 178.2 lb

## 2017-02-05 DIAGNOSIS — F411 Generalized anxiety disorder: Secondary | ICD-10-CM

## 2017-02-05 DIAGNOSIS — R5382 Chronic fatigue, unspecified: Secondary | ICD-10-CM

## 2017-02-05 DIAGNOSIS — I5022 Chronic systolic (congestive) heart failure: Secondary | ICD-10-CM

## 2017-02-05 DIAGNOSIS — I428 Other cardiomyopathies: Secondary | ICD-10-CM

## 2017-02-05 DIAGNOSIS — R5381 Other malaise: Secondary | ICD-10-CM

## 2017-02-05 DIAGNOSIS — M797 Fibromyalgia: Secondary | ICD-10-CM | POA: Diagnosis not present

## 2017-02-05 NOTE — Patient Instructions (Signed)

## 2017-02-06 ENCOUNTER — Ambulatory Visit: Payer: Medicare Other | Attending: Internal Medicine

## 2017-02-06 DIAGNOSIS — G4719 Other hypersomnia: Secondary | ICD-10-CM | POA: Insufficient documentation

## 2017-02-06 DIAGNOSIS — J418 Mixed simple and mucopurulent chronic bronchitis: Secondary | ICD-10-CM

## 2017-02-14 ENCOUNTER — Other Ambulatory Visit (HOSPITAL_BASED_OUTPATIENT_CLINIC_OR_DEPARTMENT_OTHER): Payer: Medicare Other

## 2017-02-14 DIAGNOSIS — R768 Other specified abnormal immunological findings in serum: Secondary | ICD-10-CM | POA: Diagnosis not present

## 2017-02-14 DIAGNOSIS — R7689 Other specified abnormal immunological findings in serum: Secondary | ICD-10-CM

## 2017-02-14 DIAGNOSIS — D472 Monoclonal gammopathy: Secondary | ICD-10-CM | POA: Diagnosis present

## 2017-02-14 LAB — CBC & DIFF AND RETIC
BASO%: 0.2 % (ref 0.0–2.0)
Basophils Absolute: 0 10*3/uL (ref 0.0–0.1)
EOS%: 1.4 % (ref 0.0–7.0)
Eosinophils Absolute: 0.1 10*3/uL (ref 0.0–0.5)
HCT: 40 % (ref 34.8–46.6)
HGB: 13 g/dL (ref 11.6–15.9)
Immature Retic Fract: 3.7 % (ref 1.60–10.00)
LYMPH%: 24.2 % (ref 14.0–49.7)
MCH: 29.8 pg (ref 25.1–34.0)
MCHC: 32.5 g/dL (ref 31.5–36.0)
MCV: 91.7 fL (ref 79.5–101.0)
MONO#: 0.9 10*3/uL (ref 0.1–0.9)
MONO%: 9.3 % (ref 0.0–14.0)
NEUT#: 6.2 10*3/uL (ref 1.5–6.5)
NEUT%: 64.9 % (ref 38.4–76.8)
Platelets: 301 10*3/uL (ref 145–400)
RBC: 4.36 10*6/uL (ref 3.70–5.45)
RDW: 13.1 % (ref 11.2–14.5)
Retic %: 1.67 % (ref 0.70–2.10)
Retic Ct Abs: 72.81 10*3/uL (ref 33.70–90.70)
WBC: 9.6 10*3/uL (ref 3.9–10.3)
lymph#: 2.3 10*3/uL (ref 0.9–3.3)
nRBC: 0 % (ref 0–0)

## 2017-02-14 LAB — COMPREHENSIVE METABOLIC PANEL
ALT: 17 U/L (ref 0–55)
AST: 15 U/L (ref 5–34)
Albumin: 4.3 g/dL (ref 3.5–5.0)
Alkaline Phosphatase: 138 U/L (ref 40–150)
Anion Gap: 13 mEq/L — ABNORMAL HIGH (ref 3–11)
BUN: 27.3 mg/dL — ABNORMAL HIGH (ref 7.0–26.0)
CO2: 22 mEq/L (ref 22–29)
Calcium: 10.3 mg/dL (ref 8.4–10.4)
Chloride: 103 mEq/L (ref 98–109)
Creatinine: 1.2 mg/dL — ABNORMAL HIGH (ref 0.6–1.1)
EGFR: 44 mL/min/{1.73_m2} — ABNORMAL LOW (ref >90)
Glucose: 89 mg/dl (ref 70–140)
Potassium: 4.7 mEq/L (ref 3.5–5.1)
Sodium: 137 mEq/L (ref 136–145)
Total Bilirubin: 0.3 mg/dL (ref 0.20–1.20)
Total Protein: 8.6 g/dL — ABNORMAL HIGH (ref 6.4–8.3)

## 2017-02-15 ENCOUNTER — Ambulatory Visit: Payer: Medicare Other | Admitting: Internal Medicine

## 2017-02-15 ENCOUNTER — Ambulatory Visit (INDEPENDENT_AMBULATORY_CARE_PROVIDER_SITE_OTHER): Payer: Medicare Other | Admitting: *Deleted

## 2017-02-15 DIAGNOSIS — I5022 Chronic systolic (congestive) heart failure: Secondary | ICD-10-CM | POA: Diagnosis not present

## 2017-02-15 DIAGNOSIS — I428 Other cardiomyopathies: Secondary | ICD-10-CM

## 2017-02-15 LAB — KAPPA/LAMBDA LIGHT CHAINS
Ig Kappa Free Light Chain: 18.8 mg/L (ref 3.3–19.4)
Ig Lambda Free Light Chain: 58.1 mg/L — ABNORMAL HIGH (ref 5.7–26.3)
Kappa/Lambda FluidC Ratio: 0.32 (ref 0.26–1.65)

## 2017-02-15 NOTE — Progress Notes (Signed)
Remote ICD transmission.   

## 2017-02-16 LAB — CUP PACEART REMOTE DEVICE CHECK
Battery Voltage: 2.99 V
Brady Statistic RV Percent Paced: 0.01 %
Date Time Interrogation Session: 20181004073529
HighPow Impedance: 80 Ohm
Implantable Lead Implant Date: 20130301
Implantable Lead Location: 753860
Implantable Lead Model: 6935
Implantable Pulse Generator Implant Date: 20130301
Lead Channel Impedance Value: 494 Ohm
Lead Channel Pacing Threshold Amplitude: 0.5 V
Lead Channel Pacing Threshold Pulse Width: 0.4 ms
Lead Channel Sensing Intrinsic Amplitude: 6.875 mV
Lead Channel Sensing Intrinsic Amplitude: 6.875 mV
Lead Channel Setting Pacing Amplitude: 2.5 V
Lead Channel Setting Pacing Pulse Width: 0.4 ms
Lead Channel Setting Sensing Sensitivity: 0.3 mV

## 2017-02-19 LAB — MULTIPLE MYELOMA PANEL, SERUM
Albumin SerPl Elph-Mcnc: 3.9 g/dL (ref 2.9–4.4)
Albumin/Glob SerPl: 0.9 (ref 0.7–1.7)
Alpha 1: 0.2 g/dL (ref 0.0–0.4)
Alpha2 Glob SerPl Elph-Mcnc: 1.2 g/dL — ABNORMAL HIGH (ref 0.4–1.0)
B-Globulin SerPl Elph-Mcnc: 1.9 g/dL — ABNORMAL HIGH (ref 0.7–1.3)
Gamma Glob SerPl Elph-Mcnc: 1.3 g/dL (ref 0.4–1.8)
Globulin, Total: 4.6 g/dL — ABNORMAL HIGH (ref 2.2–3.9)
IgA, Qn, Serum: 148 mg/dL (ref 64–422)
IgG, Qn, Serum: 626 mg/dL — ABNORMAL LOW (ref 700–1600)
IgM, Qn, Serum: 1241 mg/dL — ABNORMAL HIGH (ref 26–217)
Total Protein: 8.5 g/dL (ref 6.0–8.5)

## 2017-02-21 ENCOUNTER — Telehealth: Payer: Self-pay | Admitting: Hematology and Oncology

## 2017-02-21 ENCOUNTER — Encounter: Payer: Self-pay | Admitting: Hematology and Oncology

## 2017-02-21 ENCOUNTER — Ambulatory Visit (HOSPITAL_BASED_OUTPATIENT_CLINIC_OR_DEPARTMENT_OTHER): Payer: Medicare Other | Admitting: Hematology and Oncology

## 2017-02-21 DIAGNOSIS — D803 Selective deficiency of immunoglobulin G [IgG] subclasses: Secondary | ICD-10-CM | POA: Diagnosis not present

## 2017-02-21 DIAGNOSIS — R768 Other specified abnormal immunological findings in serum: Secondary | ICD-10-CM | POA: Diagnosis not present

## 2017-02-21 DIAGNOSIS — J42 Unspecified chronic bronchitis: Secondary | ICD-10-CM | POA: Diagnosis not present

## 2017-02-21 NOTE — Telephone Encounter (Signed)
Referral message sent to HIM re [facility] referral. No f/u at [facility] at this time.

## 2017-02-22 ENCOUNTER — Encounter: Payer: Self-pay | Admitting: Cardiology

## 2017-02-27 ENCOUNTER — Encounter: Payer: Self-pay | Admitting: Hematology and Oncology

## 2017-02-27 NOTE — Progress Notes (Signed)
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 female with history of multiple varied infections including sinusitis, chronic bronchitis, cutaneous infections, diverticulitis etc. Presence of a broad-based gammaglobulin spike is confirmed to be a polyclonal abnormality. 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 Additional findings included no further evidence to suggest a monoclonal proliferative or lymphoproliferative process at this point in time. Imaging demonstrated no evidence of lytic skeletal lesions. Repeat lab work obtained prior to this [name] demonstrates essentially unchanged picture. Patient continues to feel unwell, continues to have moderate depression in IgG levels IgM and IgA are mildly elevated at this time.  Plan: --No evidence of a primary hematological condition at this time that I can identify, hence RTC with [place] PRN --have suggested patient to be evaluated by immunology for possible acquired or congenital immune deficiency based on abnormal immunoglobulin levels and recurrent infections. Patient prefers to see a provider at [facility], referral will be made.   Voice recognition software was used and creation of this note. Despite my best effort at editing the text, some misspelling/errors may have occurred.  Orders Placed This Encounter  Procedures  . Ambulatory referral to Immunology    Referral Priority:   Routine    Referral Type:   Consultation    Referral Reason:   Specialty Services Required    Referral Location:   [facility]    Requested Specialty:   Immunology    Number of Visits Requested:   1    All questions were answered.  . The patient knows to call the clinic with any problems, questions or concerns.  This note was electronically signed.    History of Presenting Illness Ashley [name] 71 y.o. 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 Patient's past history is significant for diverticulosis with diverticulitis severe enough that that required partial colectomy, congestive heart failure, chronic kidney insufficiency. Patient also has new diagnosis of COPD with chronic bronchitis and recurrent pulmonary infections. Patient suffers from recurrent cutaneous candidiasis. Main complains of the patient include significant fatigue, generalized muscle aches joint pains. Patient is suspected to have fibromyalgia, but during the evaluation a SPEP and SIFE were obtained demonstrating a poorly defined area of restricted protein mobility on SPEP consistent with IgM lambda prominence. Concern for possible presence of monoclonal gammopathy or multiple myeloma, patient was referred to our clinic for additional evaluation.  Patient returns to the clinic for continued hematological monitoring due to previous suspicion for possible clonal gammopathy with IgM. Patient continues to feel generally unwell. Her main complaints include muscle aches body pains poor energy level, depressed mood. She tends to react to the hip pain by reducing her activity level and resting more. Patient does have chronic cough productive of greenish sputum, followed by pulmonology. Patient denies active current chest pain. Denies palpitations. No nausea, vomiting, but does have abdominal discomfort and tends to have frequent diarrhea with any increase in fiber in her diet. She has had some bouts of constipation since the last [name] to clinic. Denies any urinary symptoms. No new neurological deficits.  Medical History: Past Medical History:  Diagnosis Date  . Abdominal aortic atherosclerosis ([place]) 11/2015   by CT  . Allergy   . Anemia   . Anxiety   . Automatic implantable cardioverter-defibrillator in situ 2012   a. MDT single lead ICD.  Marland Kitchen Bronchiectasis    a. possibly mild,  treat URIs aggressively  .  Cancer (HCC)    basal cell, arms & neck  . Chest pain    a. prev neg MV.  . Chronic sinusitis   . Chronic systolic CHF (congestive heart failure) (HCC)    a. EF 25-35%, LV dilation, significant MR, goal weight 150lbs;  b. 04/2014 Echo: EF 50-55%, Gr 1 DD.  Marland Kitchen Depression   . Diarrhea 07/14/2008   Qualifier: Diagnosis of  By: Glori Bickers MD, Carmell Austria   . Disorder of vocal cords   . Essential hypertension   . Fibrocystic breast disease   . Fibromyalgia   . GERD (gastroesophageal reflux disease)   . H/O multiple allergies 10/10/2013  . Hemorrhoids   . History of diverticulitis of colon   . History of pneumonia 2012  . Insomnia   . Irritable bowel syndrome   . Laryngeal nodule    Dr. Thomasena Edis  . Lung disease 07/14/11  . Migraine without aura, without mention of intractable migraine without mention of status migrainosus   . Mitral regurgitation   . Nonischemic cardiomyopathy ([place])    a. Recovered LV fxn - prev as low as 25%, now 50-55% by echo 04/2014;  b. 2012 s/p MDT [id number] Secura VR single lead ICD.  Marland Kitchen Osteoarthritis    knees, back, hands, low back,   . Osteoporosis 01/2013   T-2.7 spine (01/2016)  . Pneumonia 2011  . PONV (postoperative nausea and vomiting)   . Pre-syncope   . Pure hypercholesterolemia   . sigmoid diverticulitis with perforation, abscess and fistula 09/30/2013  . Sleep apnea 2010   borderline, inconclusive- /w [place]     Surgical History: Past Surgical History:  Procedure Laterality Date  . ABDOMINAL HYSTERECTOMY  1987   w/BSO  . ANAL RECTAL MANOMETRY N/A 02/02/2016   Procedure: ANO RECTAL MANOMETRY;  Surgeon: Mauri Pole, MD;  Location: [facility];  Service: Endoscopy;  Laterality: N/A;  . APPENDECTOMY    . AUGMENTATION MAMMAPLASTY    . BREAST BIOPSY     "I've had 2-3 bx; all benign"  . breast cystectomies     due to FCBD  . BREAST MASS EXCISION  01/2010   Left-fibrocystic change w/intraductal papilloma, no malignancy  . carotid US  63/8466    5-99% LICA, normal RICA  . [employer] Bunionectomy  11/04/08   Right Great Toe (Dr. Beola Cord)  . COLONOSCOPY  1998   nml (sigmoid-Gilbert-nol)  . Colonoscopy  01/04/04   polyps, diverticulosis/severe  . COLOSTOMY N/A 10/02/2013   DESCENDING COLOSTOMY;  Surgeon: Imogene Burn. Tsuei, MD  . Derrill Memo REVERSAL N/A 02/10/2014   [name], MD  . CT chest  12/2010   possible bronchiectasis lower lobes, 02/2011 - enlarged bilateral effusions, bibasilar atx  . CT chest  04/2011   no PE.  abnormal R hilar and mediastinal adenopathy --> on rpt 05/2011, resolved  . DEXA  03/28/99   osteopenia L/S  . DEXA  03/12/03   2.2 Spine 0.1 Hip  Osteopenia  . DEXA  11/14/05   1.9 Spine 0.3 Hip  slight improvement  . DEXA  11/22/06   improved osteo and fem neck  . DEXA  01/2013   T score -2.6 at spine  . EMG/MCV  10/16/01   + mild carpal tunnel  . ESOPHAGOGASTRODUODENOSCOPY  09/2014   small HH, irregular Z line, fundic gland polyps with mild gastritis ([place])  . Flex laryngoscopy  06/11/06   ([place]) nml  . Head MRI, Head CT  10/1998   ?  H/A focus ([place])  . ICD placement  07/14/11   w/pacemaker  . IMPLANTABLE CARDIOVERTER DEFIBRILLATOR IMPLANT N/A 07/14/2011   IMPLANTABLE CARDIOVERTER DEFIBRILLATOR IMPLANT;  Surgeon: Evans Lance, MD;  Location: [facility] CATH LAB;  Service: Cardiovascular;  Laterality: N/A;  . KNEE ARTHROSCOPY W/ ORIF     Left; "meniscus tear"  . KNEE SURGERY     left  . L/S films  11/21/01   nml; Right hip nml  . LASIK     left eye  . MANDIBLE SURGERY     "got about 6 pins in the bottom of my jaw"  . MANDIBLE SURGERY    . MRI Right Hip  12/12/01   Tendonitis Gluteus Medius to Gtr Trochanter Neg Bursitis (Dr. Eulas Post)  . MRI U/S  11/21/01   min. DDD L/3-4  . NCS  11/2014   L ulnar neuropathy, mild B median nerve entrapment ([place])  . OSTEOTOMY     Left foot  . PARTIAL COLECTOMY N/A 10/02/2013   PARTIAL COLECTOMY;  Surgeon: Imogene Burn. Tsuei, MD  . sinus surgery     x3 with balloon   . stress  myoview  02/2011   no significant ischemia  . TOE AMPUTATION     "toe beside baby toe on left foot; got gangrene from corn"  . TONSILLECTOMY AND ADENOIDECTOMY    . US ECHOCARDIOGRAPHY  04/2011   mildly dilated LV, EF 25-30%, hypokinesis, restrictive physiology, mod MR, no AS    Family History: Family History  Problem Relation Age of Onset  . Lung cancer Father        + smoker  . Colon polyps Father   . Dementia Mother   . Osteoporosis Mother        Lumbar spine  . Stroke Mother        x 5 @ 79 YOA  . Arthritis Mother        hands  . Emphysema Mother   . Alcohol abuse Paternal Grandfather   . Stroke Paternal Grandfather        ?  Marland Kitchen Heart disease Paternal Grandfather   . Hypertension Maternal Grandfather        ?  Marland Kitchen Heart disease Paternal Grandmother   . Colon cancer Neg Hx   . Esophageal cancer Neg Hx   . Gallbladder disease Neg Hx     Social History: Social History   Social History  . Marital status: Married    Spouse name: N/A  . Number of children: 1  . Years of education: N/A   Occupational History  . 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 [employer]    Social History Main Topics  . Smoking status: Never Smoker  . Smokeless tobacco: Never Used     Comment: Lived with smokers x 23 yrs, smoked herself "for a week"  . Alcohol use No  . Drug use: No  . Sexual activity: Not Currently   Other Topics Concern  . Not on file   Social History Narrative   1 adopted child      Lives with husband             Allergies: Allergies  Allergen Reactions  . Antihistamines, Chlorpheniramine-Type Other (See Comments)    Makes her eyes look like she sees strobe lights  . Codeine Other (See Comments)    Confusion and dizziness  . Cyclobenzaprine Other (See Comments)    Adverse reaction - back and throat pain, dizziness, exhaustion  .  Cymbalta [Duloxetine Hcl] Other (See Comments)    "body spasms and made me feel weird in my head"   .  Influenza Vac Split [Flu Virus Vaccine] Other (See Comments)    "allergic to concentrated eggs that are in vaccine"  . Prevnar [Pneumococcal 13-Val Conj Vacc] Other (See Comments)    Egg allergy Bad reaction after shot  . Sulfasalazine Other (See Comments)    "makes my whole body smell like sulfa & makes me sick just smelling it"  . Atorvastatin Other (See Comments)    Severe muscle cramps to generic atorvastatin.  Able to take brand lipitor.  . Chlorhexidine Other (See Comments)    blisters  . Dilaudid [Hydromorphone Hcl] Other (See Comments)    Feels like she is burning internally   . Latex Nausea Only, Rash and Other (See Comments)    Pain and infection  . Morphine And Related Other (See Comments)    Internally feels like she is on fire   . Pravastatin Other (See Comments)    myalgias  . Prevacid [Lansoprazole] Other (See Comments)    Worsened GI side effects  . Red Dye Nausea Only and Swelling  . Rosuvastatin Other (See Comments)    Was not effective controlling lipids  . Simvastatin Other (See Comments)    Leg cramps  . Other     NOTE: pt is able to take cephalosporins without reaction  . Penicillins Other (See Comments)    REACTION: "told 50 years ago I couldn't take it; might be immune to it", able to take amoxicillin  . Sertraline Other (See Comments)    Overly sedating  . Tape Other (See Comments)    All adhesives  - Blisters, itching and burning   . Metaxalone Other (See Comments)    REACTION: unknown    Medications:  Current Outpatient Prescriptions  Medication Sig Dispense Refill  . albuterol (PROVENTIL HFA;VENTOLIN HFA) 108 (90 Base) MCG/ACT inhaler Inhale 2 puffs into the lungs every 6 (six) hours as needed for wheezing or shortness of breath. 1 Inhaler 0  . alendronate (FOSAMAX) 70 MG tablet Take 70 mg by mouth once a week. Patient takes every Thursday    . ALPRAZolam (XANAX) 0.5 MG tablet Take 0.5 tablets (0.25 mg total) by mouth at bedtime as needed for  sleep. 30 tablet 1  . AMBULATORY NON FORMULARY MEDICATION Medication Name:  1 squatty potty to use as needed 1 each 0  . Ascorbic Acid (VITAMIN C) 1000 MG tablet Take 1,000 mg by mouth daily.    Marland Kitchen aspirin EC 81 MG tablet Take 81 mg by mouth daily.    . Calcium Carbonate-Vitamin D (CALCIUM + D PO) Take 1 tablet by mouth daily.    . carvedilol (COREG) 6.25 MG tablet TAKE 1 TABLET (6.25 MG TOTAL) BY MOUTH 2 (TWO) TIMES DAILY WITH A MEAL. 180 tablet 3  . cholecalciferol (VITAMIN D) 1000 units tablet Take 2,000 Units by mouth daily.    Marland Kitchen dextromethorphan (DELSYM) 30 MG/5ML liquid Take 15 mg by mouth at bedtime as needed for cough.    . ezetimibe (ZETIA) 10 MG tablet TAKE 1 TABLET (10 MG TOTAL) BY MOUTH DAILY. 30 tablet 5  . losartan (COZAAR) 50 MG tablet Take 1 tablet (50 mg total) by mouth daily. 30 tablet 6  . nystatin (MYCOSTATIN/NYSTOP) powder Apply topically 2 (two) times daily as needed. 30 g 0  . omeprazole (PRILOSEC) 40 MG capsule Take 1 capsule (40 mg total) by mouth daily.    Marland Kitchen  potassium chloride (K-DUR) 10 MEQ tablet Take 10 mEq by mouth as directed. Take daily as needed    . Pyridoxine HCl (VITAMIN B6 PO) Take 1 capsule by mouth daily. Reported on 07/12/2015    . Red Yeast Rice 600 MG CAPS Take 1 capsule (600 mg total) by mouth 2 (two) times daily.    Marland Kitchen spironolactone (ALDACTONE) 25 MG tablet TAKE 1 TABLET BY MOUTH DAILY 90 tablet 3  . torsemide (DEMADEX) 10 MG tablet Take 10 mg by mouth daily as needed (swelling/fluid).    . triamcinolone (NASACORT) 55 MCG/ACT AERO nasal inhaler Place 2 sprays into the nose 2 (two) times daily.    Marland Kitchen umeclidinium-vilanterol (ANORO ELLIPTA) 62.5-25 MCG/INH AEPB Inhale 1 puff into the lungs daily. (Patient not taking: Reported on 02/21/2017) 60 each 5  . vitamin E 100 UNIT capsule Take 100 Units by mouth daily.     No current facility-administered medications for this [name].     Review of Systems: Review of Systems  Constitutional: Positive for appetite  change and fatigue. Negative for chills, diaphoresis, fever and unexpected weight change.       Poor appetite.  HENT:   Negative for lump/mass, mouth sores, nosebleeds, sore throat and trouble swallowing.   Eyes: Negative for eye problems and icterus.  Respiratory: Positive for cough and wheezing. Negative for hemoptysis and shortness of breath.   Cardiovascular: Positive for leg swelling. Negative for chest pain and palpitations.  Gastrointestinal: Positive for abdominal pain, constipation and diarrhea. Negative for blood in stool, nausea and vomiting.  Genitourinary: Negative for difficulty urinating, dysuria, hematuria and vaginal discharge.   Musculoskeletal: Positive for arthralgias, back pain and myalgias.  Skin: Negative for itching and rash.  Neurological: Positive for headaches. Negative for dizziness, extremity weakness, light-headedness, numbness and seizures.  Hematological: Negative for adenopathy. Bruises/bleeds easily.  Psychiatric/Behavioral: Positive for depression. Negative for suicidal ideas.     PHYSICAL EXAMINATION There were no vitals taken for this [name].  ECOG PERFORMANCE STATUS: 1 - Symptomatic but completely ambulatory  Physical Exam  Constitutional: She is oriented to person, place, and time and well-developed, well-nourished, and in no distress.  HENT:  Head: Atraumatic.  Right Ear: External ear normal.  Left Ear: External ear normal.  Mouth/Throat: Oropharynx is clear and moist. No oropharyngeal exudate.  Eyes: Pupils are equal, round, and reactive to light. EOM are normal. No scleral icterus.  Neck: No thyromegaly present.  Cardiovascular: Normal rate and regular rhythm.  Exam reveals no gallop.   No murmur heard. Pulmonary/Chest: No stridor. No respiratory distress. She has wheezes. She exhibits tenderness.  Abdominal: Soft. She exhibits no distension and no mass. There is tenderness. There is no rebound.  Musculoskeletal: She exhibits no edema or  tenderness.  Some tenderness with possible presence of trigger points on palpation of the upper back and upper arms.  Lymphadenopathy:    She has no cervical adenopathy.  Neurological: She is alert and oriented to person, place, and time.  Skin: Skin is warm. No rash noted. She is not diaphoretic.     LABORATORY DATA: I have personally reviewed the data as listed: Clinical Support on 02/15/2017  Component Date Value Ref Range Status  . Date Time Interrogation Session 02/15/2017 [id number]   Final  . Pulse Generator Manufacturer 02/15/2017 MERM   Final  . Pulse Gen Model 02/15/2017 D224VRC Secura VR   Final  . Pulse Gen Serial Number 02/15/2017 [id number]   Final  . Clinic Name 02/15/2017 [place]  Final  . Implantable Pulse Generator Type 02/15/2017 Implantable Cardiac Defibulator   Final  . Implantable Pulse Generator Implan* 02/15/2017 [id number]   Final  . Implantable Lead Manufacturer 02/15/2017 MERM   Final  . Implantable Lead Model 02/15/2017 6935 Sprint Quattro Secure S   Final  . Implantable Lead Serial Number 02/15/2017 [id number]   Final  . Implantable Lead Implant Date 02/15/2017 [id number]   Final  . Implantable Lead Location Detail 1 02/15/2017 APEX   Final  . Implantable Lead Location 02/15/2017 [id number]   Final  . Lead Channel Setting Sensing Sensi* 02/15/2017 0.3  mV Final  . Lead Channel Setting Pacing Pulse * 02/15/2017 0.4  ms Final  . Lead Channel Setting Pacing Amplit* 02/15/2017 2.5  V Final  . Lead Channel Impedance Value 02/15/2017 494  ohm Final  . Lead Channel Sensing Intrinsic Amp* 02/15/2017 6.875  mV Final  . Lead Channel Sensing Intrinsic Amp* 02/15/2017 6.875  mV Final  . Lead Channel Pacing Threshold Ampl* 02/15/2017 0.5  V Final  . Lead Channel Pacing Threshold Puls* 02/15/2017 0.4  ms Final  . HighPow Impedance 02/15/2017 80  ohm Final  . Battery Status 02/15/2017 OK   Final  . Battery Voltage 02/15/2017 2.99  V Final  . Brady Statistic RV  Percent Paced 02/15/2017 0.01  % Final  . Eval Rhythm 02/15/2017 Vs   Final       Ardath Sax, MD

## 2017-02-27 NOTE — Assessment & Plan Note (Signed)
71 y.o. female with history of multiple varied infections including sinusitis, chronic bronchitis, cutaneous infections, diverticulitis etc. Presence of a broad-based gammaglobulin spike is confirmed to be a polyclonal abnormality. 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 Additional findings included no further evidence to suggest a monoclonal proliferative or lymphoproliferative process at this point in time. Imaging demonstrated no evidence of lytic skeletal lesions. Repeat lab work obtained prior to this visit demonstrates essentially unchanged picture. Patient continues to feel unwell, continues to have moderate depression in IgG levels IgM and IgA are mildly elevated at this time.  Plan: --No evidence of a primary hematological condition at this time that I can identify, hence RTC with [place] PRN --have suggested patient to be evaluated by immunology for possible acquired or congenital immune deficiency based on abnormal immunoglobulin levels and recurrent infections. Patient prefers to see a provider at [facility], referral will be made.

## 2017-03-07 ENCOUNTER — Telehealth: Payer: Self-pay | Admitting: Hematology and Oncology

## 2017-03-07 NOTE — Telephone Encounter (Signed)
FAXED RECORDS TO [place] [phone].  THE CLINIC WILL CALL PT WITH APPT.

## 2017-03-09 ENCOUNTER — Encounter: Payer: Self-pay | Admitting: Cardiology

## 2017-03-19 ENCOUNTER — Encounter: Payer: Self-pay | Admitting: Internal Medicine

## 2017-03-19 ENCOUNTER — Ambulatory Visit (INDEPENDENT_AMBULATORY_CARE_PROVIDER_SITE_OTHER): Payer: Medicare Other | Admitting: Internal Medicine

## 2017-03-19 VITALS — BP 112/74 | HR 86 | Temp 98.2°F | Wt 177.0 lb

## 2017-03-19 DIAGNOSIS — K1370 Unspecified lesions of oral mucosa: Secondary | ICD-10-CM

## 2017-03-19 DIAGNOSIS — J479 Bronchiectasis, uncomplicated: Secondary | ICD-10-CM | POA: Diagnosis not present

## 2017-03-19 MED ORDER — LEVOFLOXACIN 250 MG PO TABS: ORAL_TABLET | ORAL | 0 refills | Status: DC

## 2017-03-19 MED ORDER — MAGIC MOUTHWASH: 5.0000 mL | Freq: Three times a day (TID) | ORAL | 0 refills | Status: DC | PRN

## 2017-03-19 NOTE — Progress Notes (Signed)
Subjective:    Patient ID: Ashley [name], female    DOB: 09/06/1945, 71 y.o.   MRN: [id number]  HPI  Pt presents to the clinic today with c/o right ear pain, and oral pain. She reports this started 1 day ago. She describes the ear pain as sharp and shooting. She denies decrease hearing or drainage from the ear. She reports she has had a polyp type lesion on the roof of her mouth for years. Intermittently, it will swell up and cause pain. She takes [employer] as needed with good relief, but reports she is out and will need a new RX today.  She also reports she feels like she is getting bronchitis. She reports mild sore throat, hoarseness and cough. She reports this has been going on for a few days. She denies difficulty swallowing. The cough is non productive. She denies fever, chills or body aches. She has a history of allergies, bronchiectasis and NICM. She reports any time she gets sick, her EF takes a big hit and she is requesting Levaquin today.  Review of Systems  Past Medical History:  Diagnosis Date  . Abdominal aortic atherosclerosis ([place]) 11/2015   by CT  . Allergy   . Anemia   . Anxiety   . Automatic implantable cardioverter-defibrillator in situ 2012   a. MDT single lead ICD.  Marland Kitchen Bronchiectasis    a. possibly mild, treat URIs aggressively  . Cancer (HCC)    basal cell, arms & neck  . Chest pain    a. prev neg MV.  . Chronic sinusitis   . Chronic systolic CHF (congestive heart failure) (HCC)    a. EF 25-35%, LV dilation, significant MR, goal weight 150lbs;  b. 04/2014 Echo: EF 50-55%, Gr 1 DD.  Marland Kitchen Depression   . Diarrhea 07/14/2008   Qualifier: Diagnosis of  By: Glori Bickers MD, Carmell Austria   . Disorder of vocal cords   . Essential hypertension   . Fibrocystic breast disease   . Fibromyalgia   . GERD (gastroesophageal reflux disease)   . H/O multiple allergies 10/10/2013  . Hemorrhoids   . History of diverticulitis of colon   . History of pneumonia 2012  .  Insomnia   . Irritable bowel syndrome   . Laryngeal nodule    Dr. Thomasena Edis  . Lung disease 07/14/11  . Migraine without aura, without mention of intractable migraine without mention of status migrainosus   . Mitral regurgitation   . Nonischemic cardiomyopathy ([place])    a. Recovered LV fxn - prev as low as 25%, now 50-55% by echo 04/2014;  b. 2012 s/p MDT [id number] [employer] VR single lead ICD.  Marland Kitchen Osteoarthritis    knees, back, hands, low back,   . Osteoporosis 01/2013   T-2.7 spine (01/2016)  . Pneumonia 2011  . PONV (postoperative nausea and vomiting)   . Pre-syncope   . Pure hypercholesterolemia   . sigmoid diverticulitis with perforation, abscess and fistula 09/30/2013  . Sleep apnea 2010   borderline, inconclusive- /w [place]     Current Outpatient Medications  Medication Sig Dispense Refill  . albuterol (PROVENTIL HFA;VENTOLIN HFA) 108 (90 Base) MCG/ACT inhaler Inhale 2 puffs into the lungs every 6 (six) hours as needed for wheezing or shortness of breath. 1 Inhaler 0  . alendronate (FOSAMAX) 70 MG tablet Take 70 mg by mouth once a week. Patient takes every Thursday    . ALPRAZolam (XANAX) 0.5 MG tablet Take 0.5 tablets (0.25 mg total)  by mouth at bedtime as needed for sleep. 30 tablet 1  . AMBULATORY NON FORMULARY MEDICATION Medication Name:  1 squatty potty to use as needed 1 each 0  . Ascorbic Acid (VITAMIN C) 1000 MG tablet Take 1,000 mg by mouth daily.    Marland Kitchen aspirin EC 81 MG tablet Take 81 mg by mouth daily.    . Calcium Carbonate-Vitamin D (CALCIUM + D PO) Take 1 tablet by mouth daily.    . carvedilol (COREG) 6.25 MG tablet TAKE 1 TABLET (6.25 MG TOTAL) BY MOUTH 2 (TWO) TIMES DAILY WITH A MEAL. 180 tablet 3  . cholecalciferol (VITAMIN D) 1000 units tablet Take 2,000 Units by mouth daily.    Marland Kitchen dextromethorphan (DELSYM) 30 MG/5ML liquid Take 15 mg by mouth at bedtime as needed for cough.    . ezetimibe (ZETIA) 10 MG tablet TAKE 1 TABLET (10 MG TOTAL) BY MOUTH DAILY. 30 tablet 5  .  losartan (COZAAR) 50 MG tablet Take 1 tablet (50 mg total) by mouth daily. 30 tablet 6  . nystatin (MYCOSTATIN/NYSTOP) powder Apply topically 2 (two) times daily as needed. 30 g 0  . omeprazole (PRILOSEC) 40 MG capsule Take 1 capsule (40 mg total) by mouth daily.    . potassium chloride (K-DUR) 10 MEQ tablet Take 10 mEq by mouth as directed. Take daily as needed    . Pyridoxine HCl (VITAMIN B6 PO) Take 1 capsule by mouth daily. Reported on 07/12/2015    . Red Yeast Rice 600 MG CAPS Take 1 capsule (600 mg total) by mouth 2 (two) times daily.    Marland Kitchen spironolactone (ALDACTONE) 25 MG tablet TAKE 1 TABLET BY MOUTH DAILY 90 tablet 3  . torsemide (DEMADEX) 10 MG tablet Take 10 mg by mouth daily as needed (swelling/fluid).    . triamcinolone (NASACORT) 55 MCG/ACT AERO nasal inhaler Place 2 sprays into the nose 2 (two) times daily.    Marland Kitchen umeclidinium-vilanterol (ANORO ELLIPTA) 62.5-25 MCG/INH AEPB Inhale 1 puff into the lungs daily. 60 each 5  . vitamin E 100 UNIT capsule Take 100 Units by mouth daily.    Marland Kitchen levofloxacin (LEVAQUIN) 250 MG tablet Take 2 tabs today, then 1 tab daily x 6 days 8 tablet 0  . magic mouthwash SOLN Take 5 mLs 3 (three) times daily as needed by mouth for mouth pain. 120 mL 0   No current facility-administered medications for this visit.     Allergies  Allergen Reactions  . Antihistamines, Chlorpheniramine-Type Other (See Comments)    Makes her eyes look like she sees strobe lights  . Codeine Other (See Comments)    Confusion and dizziness  . Cyclobenzaprine Other (See Comments)    Adverse reaction - back and throat pain, dizziness, exhaustion  . Cymbalta [Duloxetine Hcl] Other (See Comments)    "body spasms and made me feel weird in my head"   . Influenza Vac Split [Flu Virus Vaccine] Other (See Comments)    "allergic to concentrated eggs that are in vaccine"  . Prevnar [Pneumococcal 13-Val Conj Vacc] Other (See Comments)    Egg allergy Bad reaction after shot  .  Sulfasalazine Other (See Comments)    "makes my whole body smell like sulfa & makes me sick just smelling it"  . Atorvastatin Other (See Comments)    Severe muscle cramps to generic atorvastatin.  Able to take brand lipitor.  . Chlorhexidine Other (See Comments)    blisters  . Dilaudid [Hydromorphone Hcl] Other (See Comments)  Feels like she is burning internally   . Latex Nausea Only, Rash and Other (See Comments)    Pain and infection  . Morphine And Related Other (See Comments)    Internally feels like she is on fire   . Pravastatin Other (See Comments)    myalgias  . Prevacid [Lansoprazole] Other (See Comments)    Worsened GI side effects  . Red Dye Nausea Only and Swelling  . Rosuvastatin Other (See Comments)    Was not effective controlling lipids  . Simvastatin Other (See Comments)    Leg cramps  . Other     NOTE: pt is able to take cephalosporins without reaction  . Penicillins Other (See Comments)    REACTION: "told 50 years ago I couldn't take it; might be immune to it", able to take amoxicillin  . Sertraline Other (See Comments)    Overly sedating  . Tape Other (See Comments)    All adhesives  - Blisters, itching and burning   . Metaxalone Other (See Comments)    REACTION: unknown    Family History  Problem Relation Age of Onset  . Lung cancer Father        + smoker  . Colon polyps Father   . Dementia Mother   . Osteoporosis Mother        Lumbar spine  . Stroke Mother        x 5 @ 63 YOA  . Arthritis Mother        hands  . Emphysema Mother   . Alcohol abuse Paternal Grandfather   . Stroke Paternal Grandfather        ?  Marland Kitchen Heart disease Paternal Grandfather   . Hypertension Maternal Grandfather        ?  Marland Kitchen Heart disease Paternal Grandmother   . Colon cancer Neg Hx   . Esophageal cancer Neg Hx   . Gallbladder disease Neg Hx     Social History   Socioeconomic History  . Marital status: Married    Spouse name: Not on file  . Number of children:  1  . Years of education: Not on file  . Highest education level: Not on file  Social Needs  . Financial resource strain: Not on file  . Food insecurity - worry: Not on file  . Food insecurity - inability: Not on file  . Transportation needs - medical: Not on file  . Transportation needs - non-medical: Not on file  Occupational History  . Occupation: Oncologist  . Occupation: Aeronautical engineer  Tobacco Use  . Smoking status: Never Smoker  . Smokeless tobacco: Never Used  . Tobacco comment: Lived with smokers x 23 yrs, smoked herself "for a week"  Substance and Sexual Activity  . Alcohol use: No    Alcohol/week: 0.0 oz  . Drug use: No  . Sexual activity: Not Currently  Other Topics Concern  . Not on file  Social History Narrative   1 adopted child      Lives with husband           Constitutional: Denies fever, malaise, fatigue, headache or abrupt weight changes.  HEENT: Pt reports ear pain, oral pain, hoarseness, sore throat. Denies eye pain, eye redness, ringing in the ears, wax buildup, runny nose, nasal congestion, bloody nose. Respiratory: Pt reports cough. Denies difficulty breathing, shortness of breath, or sputum production.    No other specific complaints in a complete review of systems (except as  listed in HPI above).     Objective:   Physical Exam   BP 112/74   Pulse 86   Temp 98.2 F (36.8 C) (Oral)   Wt 177 lb (80.3 kg)   SpO2 96%   BMI 29.45 kg/m  Wt Readings from Last 3 Encounters:  03/19/17 177 lb (80.3 kg)  02/05/17 178 lb 4 oz (80.9 kg)  01/05/17 179 lb 6.4 oz (81.4 kg)    General: Appears her stated age, in NAD. HEENT: Ears: Tm's gray and intact, normal light reflex, mild cerumen buildup on right;  Throat/Mouth: Teeth present, mucosa pink and moist, no exudate, or ulcerations noted. Polyp noted on hard palate. Neck:  Neck supple, trachea midline. No masses, lumps or thyromegaly present.  Pulmonary/Chest:  Normal effort and positive vesicular breath sounds. No respiratory distress. No wheezes, rales or ronchi noted.   BMET    Component Value Date/Time   NA 137 02/14/2017 1115   K 4.7 02/14/2017 1115   CL 106 10/27/2016 0106   CO2 22 02/14/2017 1115   GLUCOSE 89 02/14/2017 1115   BUN 27.3 (H) 02/14/2017 1115   CREATININE 1.2 (H) 02/14/2017 1115   CALCIUM 10.3 02/14/2017 1115   GFRNONAA 42 (L) 10/27/2016 0106   GFRAA 49 (L) 10/27/2016 0106    Lipid Panel     Component Value Date/Time   CHOL 240 (H) 09/20/2016 0934   TRIG 224.0 (H) 09/20/2016 0934   HDL 44.10 09/20/2016 0934   CHOLHDL 5 09/20/2016 0934   VLDL 44.8 (H) 09/20/2016 0934   LDLCALC 109 (H) 05/27/2014 0900    CBC    Component Value Date/Time   WBC 9.6 02/14/2017 1115   WBC 10.3 10/27/2016 0106   RBC 4.36 02/14/2017 1115   RBC 4.12 10/27/2016 0106   HGB 13.0 02/14/2017 1115   HCT 40.0 02/14/2017 1115   PLT 301 02/14/2017 1115   MCV 91.7 02/14/2017 1115   MCH 29.8 02/14/2017 1115   MCH 29.4 10/27/2016 0106   MCHC 32.5 02/14/2017 1115   MCHC 32.0 10/27/2016 0106   RDW 13.1 02/14/2017 1115   LYMPHSABS 2.3 02/14/2017 1115   MONOABS 0.9 02/14/2017 1115   EOSABS 0.1 02/14/2017 1115   BASOSABS 0.0 02/14/2017 1115    Hgb A1C Lab Results  Component Value Date   HGBA1C 6.1 07/31/2016           Assessment & Plan:   Mouth Lesion:  Likely benign Refilled [facility] Magic Mouthwash today  Bronchiectasis:  Continue Anora She does not have an active infection She demands a RX for Levquin in Dr. Synthia Innocent absence. I provided her with RX but advised her not to take unless absolutely necessary  Return precautions discussed Webb Silversmith, NP

## 2017-03-19 NOTE — Patient Instructions (Signed)
Sore Throat When you have a sore throat, your throat may:  Hurt.  Burn.  Feel irritated.  Feel scratchy.  Many things can cause a sore throat, including:  An infection.  Allergies.  Dryness in the air.  Smoke or pollution.  Gastroesophageal reflux disease (GERD).  A tumor.  A sore throat can be the first sign of another sickness. It can happen with other problems, like coughing or a fever. Most sore throats go away without treatment. Follow these instructions at home:  Take over-the-counter medicines only as told by your doctor.  Drink enough fluids to keep your pee (urine) clear or pale yellow.  Rest when you feel you need to.  To help with pain, try: ? Sipping warm liquids, such as broth, herbal tea, or warm water. ? Eating or drinking cold or frozen liquids, such as frozen ice pops. ? Gargling with a salt-water mixture 3-4 times a day or as needed. To make a salt-water mixture, add -1 tsp of salt in 1 cup of warm water. Mix it until you cannot see the salt anymore. ? Sucking on hard candy or throat lozenges. ? Putting a cool-mist humidifier in your bedroom at night. ? Sitting in the bathroom with the door closed for 5-10 minutes while you run hot water in the shower.  Do not use any tobacco products, such as cigarettes, chewing tobacco, and e-cigarettes. If you need help quitting, ask your doctor. Contact a doctor if:  You have a fever for more than 2-3 days.  You keep having symptoms for more than 2-3 days.  Your throat does not get better in 7 days.  You have a fever and your symptoms suddenly get worse. Get help right away if:  You have trouble breathing.  You cannot swallow fluids, soft foods, or your saliva.  You have swelling in your throat or neck that gets worse.  You keep feeling like you are going to throw up (vomit).  You keep throwing up. This information is not intended to replace advice given to you by your health care provider. Make  sure you discuss any questions you have with your health care provider. Document Released: 02/08/2008 Document Revised: 12/26/2015 Document Reviewed: 02/19/2015 Elsevier Interactive Patient Education  2018 Elsevier Inc.  

## 2017-03-27 ENCOUNTER — Ambulatory Visit (INDEPENDENT_AMBULATORY_CARE_PROVIDER_SITE_OTHER): Payer: Medicare Other | Admitting: Family Medicine

## 2017-03-27 ENCOUNTER — Encounter: Payer: Self-pay | Admitting: Family Medicine

## 2017-03-27 VITALS — BP 118/60 | HR 100 | Temp 97.6°F | Wt 178.0 lb

## 2017-03-27 DIAGNOSIS — R7689 Other specified abnormal immunological findings in serum: Secondary | ICD-10-CM

## 2017-03-27 DIAGNOSIS — D803 Selective deficiency of immunoglobulin G [IgG] subclasses: Secondary | ICD-10-CM | POA: Diagnosis not present

## 2017-03-27 DIAGNOSIS — N183 Chronic kidney disease, stage 3 unspecified: Secondary | ICD-10-CM

## 2017-03-27 DIAGNOSIS — L304 Erythema intertrigo: Secondary | ICD-10-CM

## 2017-03-27 DIAGNOSIS — M797 Fibromyalgia: Secondary | ICD-10-CM

## 2017-03-27 DIAGNOSIS — Z8719 Personal history of other diseases of the digestive system: Secondary | ICD-10-CM

## 2017-03-27 DIAGNOSIS — R5382 Chronic fatigue, unspecified: Secondary | ICD-10-CM

## 2017-03-27 DIAGNOSIS — R5381 Other malaise: Secondary | ICD-10-CM

## 2017-03-27 DIAGNOSIS — R768 Other specified abnormal immunological findings in serum: Secondary | ICD-10-CM

## 2017-03-27 DIAGNOSIS — M270 Developmental disorders of jaws: Secondary | ICD-10-CM | POA: Insufficient documentation

## 2017-03-27 DIAGNOSIS — J42 Unspecified chronic bronchitis: Secondary | ICD-10-CM

## 2017-03-27 LAB — CBC WITH DIFFERENTIAL/PLATELET
Basophils Absolute: 0.1 10*3/uL (ref 0.0–0.1)
Basophils Relative: 0.8 % (ref 0.0–3.0)
Eosinophils Absolute: 0.1 10*3/uL (ref 0.0–0.7)
Eosinophils Relative: 1 % (ref 0.0–5.0)
HCT: 39.3 % (ref 36.0–46.0)
Hemoglobin: 12.7 g/dL (ref 12.0–15.0)
Lymphocytes Relative: 19.7 % (ref 12.0–46.0)
Lymphs Abs: 1.7 10*3/uL (ref 0.7–4.0)
MCHC: 32.4 g/dL (ref 30.0–36.0)
MCV: 91 fl (ref 78.0–100.0)
Monocytes Absolute: 1.1 10*3/uL — ABNORMAL HIGH (ref 0.1–1.0)
Monocytes Relative: 13.3 % — ABNORMAL HIGH (ref 3.0–12.0)
Neutro Abs: 5.6 10*3/uL (ref 1.4–7.7)
Neutrophils Relative %: 65.2 % (ref 43.0–77.0)
Platelets: 247 10*3/uL (ref 150.0–400.0)
RBC: 4.32 Mil/uL (ref 3.87–5.11)
RDW: 12.8 % (ref 11.5–15.5)
WBC: 8.6 10*3/uL (ref 4.0–10.5)

## 2017-03-27 LAB — RENAL FUNCTION PANEL
Albumin: 4.4 g/dL (ref 3.5–5.2)
BUN: 17 mg/dL (ref 6–23)
CO2: 27 mEq/L (ref 19–32)
Calcium: 10 mg/dL (ref 8.4–10.5)
Chloride: 103 mEq/L (ref 96–112)
Creatinine, Ser: 1.01 mg/dL (ref 0.40–1.20)
GFR: 57.32 mL/min — ABNORMAL LOW (ref >60.00)
Glucose, Bld: 100 mg/dL — ABNORMAL HIGH (ref 70–99)
Phosphorus: 3.5 mg/dL (ref 2.3–4.6)
Potassium: 4.8 mEq/L (ref 3.5–5.1)
Sodium: 139 mEq/L (ref 135–145)

## 2017-03-27 NOTE — Assessment & Plan Note (Signed)
S/p unrevealing hematological evaluation. Dr Lebron Conners (hematology) has placed referral to [facility] for further evaluation. Reviewing chart, she seems to have rheumatology appt scheduled for 06/2017. I asked her to contact Dr Lebron Conners office to check about immunology referral - ensure she is scheduled with correct physician.

## 2017-03-27 NOTE — Patient Instructions (Addendum)
Call Dr Clydene Laming office to ask to speak with referral coordinator - looks like you have [place] appointment with rheumatologist but Dr Lebron Conners wanted appointment with immunologist.  Labs today to check for infection.  Lungs sound ok today.  Increase yeast powder to twice daily.  Let me know if swelling on roof of mouth is not improving each day.  Return to see me in 10 days.

## 2017-03-27 NOTE — Assessment & Plan Note (Signed)
Anticipate benign torus palatinus

## 2017-03-27 NOTE — Progress Notes (Signed)
BP 118/60 (BP Location: Left Arm, Patient Position: Sitting, Cuff Size: Normal)   Pulse 100   Temp 97.6 F (36.4 C) (Oral)   Wt 178 lb (80.7 kg)   SpO2 97%   BMI 29.62 kg/m    CC: abd pain Subjective:    Patient ID: Ashley [name], female    DOB: Apr 15, 1946, 71 y.o.   MRN: [id number]  HPI: Ashley [name] is a 71 y.o. female presenting on 03/27/2017 for Abdominal Pain (Had surgery on LLQ. Area is warm to touch and painful. Cannot sit for long periods. Says she has not "bounced back" completely from surgery.  Thinks she may need CT and/or blood work (has a Building surveyor) to see what is going on. Saw hematology 02/2017. Has appt with L[facility]us and allergy doctor 06/2017. 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 C/o chills, raw throat, tender gland on left side of neck)   Here with husband today. Known chronic sCHF, NICM, ICD in place, FM, IBS, h/o PNA and chronic fatigue. Did not tolerate entresto. Saw pulm - chronic bronchitis treated with anoro ellipta - this caused chest burning so she stopped it. Sleep study was negative for OSA.   Seen last week at our office and treated with levaquin 250mg  7d course for possible respiratory infection. She feels this was a "weak" antibiotic because she normally takes 500mg  daily and felt it was not effective. Advised this was correct dose in her CKD. Cough is better. Sore throat, roof of mouth feels swollen, L AC gland tenderness.   Ongoing lower abdominal discomfort, ongoing yeast infection, left side of lower abdomen feels larger than the right.   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 S/p eval by hematology - negative for primary hematological condition.   Severe reaction to pneumococcal vaccine 05/2015.   Relevant past medical, surgical, family and social history reviewed and updated as indicated. Interim medical history since our last visit reviewed. Allergies and  medications reviewed and updated. Outpatient Medications Prior to Visit  Medication Sig Dispense Refill  . albuterol (PROVENTIL HFA;VENTOLIN HFA) 108 (90 Base) MCG/ACT inhaler Inhale 2 puffs into the lungs every 6 (six) hours as needed for wheezing or shortness of breath. 1 Inhaler 0  . alendronate (FOSAMAX) 70 MG tablet Take 70 mg by mouth once a week. Patient takes every Thursday    . ALPRAZolam (XANAX) 0.5 MG tablet Take 0.5 tablets (0.25 mg total) by mouth at bedtime as needed for sleep. 30 tablet 1  . AMBULATORY NON FORMULARY MEDICATION Medication Name:  1 squatty potty to use as needed 1 each 0  . Ascorbic Acid (VITAMIN C) 1000 MG tablet Take 1,000 mg by mouth daily.    Marland Kitchen aspirin EC 81 MG tablet Take 81 mg by mouth daily.    . Calcium Carbonate-Vitamin D (CALCIUM + D PO) Take 1 tablet by mouth daily.    . carvedilol (COREG) 6.25 MG tablet TAKE 1 TABLET (6.25 MG TOTAL) BY MOUTH 2 (TWO) TIMES DAILY WITH A MEAL. 180 tablet 3  . cholecalciferol (VITAMIN D) 1000 units tablet Take 2,000 Units by mouth daily.    Marland Kitchen dextromethorphan (DELSYM) 30 MG/5ML liquid Take 15 mg by mouth at bedtime as needed for cough.    . ezetimibe (ZETIA) 10 MG tablet TAKE 1 TABLET (10 MG TOTAL) BY MOUTH DAILY. 30 tablet 5  . magic mouthwash SOLN Take 5 mLs 3 (three) times daily as needed  by mouth for mouth pain. 120 mL 0  . nystatin (MYCOSTATIN/NYSTOP) powder Apply topically 2 (two) times daily as needed. 30 g 0  . omeprazole (PRILOSEC) 40 MG capsule Take 1 capsule (40 mg total) by mouth daily.    . potassium chloride (K-DUR) 10 MEQ tablet Take 10 mEq by mouth as directed. Take daily as needed    . Pyridoxine HCl (VITAMIN B6 PO) Take 1 capsule by mouth daily. Reported on 07/12/2015    . Red Yeast Rice 600 MG CAPS Take 1 capsule (600 mg total) by mouth 2 (two) times daily.    Marland Kitchen spironolactone (ALDACTONE) 25 MG tablet TAKE 1 TABLET BY MOUTH DAILY 90 tablet 3  . torsemide (DEMADEX) 10 MG tablet Take 10 mg by mouth daily as  needed (swelling/fluid).    . triamcinolone (NASACORT) 55 MCG/ACT AERO nasal inhaler Place 2 sprays into the nose 2 (two) times daily.    Marland Kitchen umeclidinium-vilanterol (ANORO ELLIPTA) 62.5-25 MCG/INH AEPB Inhale 1 puff into the lungs daily. 60 each 5  . vitamin E 100 UNIT capsule Take 100 Units by mouth daily.    Marland Kitchen levofloxacin (LEVAQUIN) 250 MG tablet Take 2 tabs today, then 1 tab daily x 6 days 8 tablet 0  . losartan (COZAAR) 50 MG tablet Take 1 tablet (50 mg total) by mouth daily. 30 tablet 6   No facility-administered medications prior to visit.      Per HPI unless specifically indicated in ROS section below Review of Systems     Objective:    BP 118/60 (BP Location: Left Arm, Patient Position: Sitting, Cuff Size: Normal)   Pulse 100   Temp 97.6 F (36.4 C) (Oral)   Wt 178 lb (80.7 kg)   SpO2 97%   BMI 29.62 kg/m   Wt Readings from Last 3 Encounters:  03/27/17 178 lb (80.7 kg)  03/19/17 177 lb (80.3 kg)  02/05/17 178 lb 4 oz (80.9 kg)    Physical Exam  Constitutional: She appears well-developed and well-nourished. No distress.  HENT:  Head: Normocephalic and atraumatic.  Right Ear: Hearing and ear canal normal.  Left Ear: Hearing, tympanic membrane and ear canal normal.  Nose: No mucosal edema or rhinorrhea.  Mouth/Throat: Uvula is midline, oropharynx is clear and moist and mucous membranes are normal. Oral lesions present. No oropharyngeal exudate, posterior oropharyngeal edema, posterior oropharyngeal erythema or tonsillar abscesses.  Hard mass upper hard palate R canal with cerumen L TM pearly grey  Cardiovascular: Normal rate, regular rhythm, normal heart sounds and intact distal pulses.  No murmur heard. Pulmonary/Chest: Effort normal and breath sounds normal. No respiratory distress. She has no wheezes. She has no rales.  Abdominal: Soft. Normal appearance and bowel sounds are normal. She exhibits no distension and no mass. There is no hepatosplenomegaly. There is  generalized tenderness. There is no rigidity, no rebound, no guarding and negative Murphy's sign.  Musculoskeletal: She exhibits no edema.  Skin: Skin is warm and dry. Rash noted.  Patches of erythematous rash at lower abdominal pannus R and midline  Psychiatric: She has a normal mood and affect.  Nursing note and vitals reviewed.  Results for orders placed or performed in visit on 03/27/17  Renal function panel  Result Value Ref Range   Sodium 139 135 - 145 mEq/L   Potassium 4.8 3.5 - 5.1 mEq/L   Chloride 103 96 - 112 mEq/L   CO2 27 19 - 32 mEq/L   Calcium 10.0 8.4 - 10.5 mg/dL  Albumin 4.4 3.5 - 5.2 g/dL   BUN 17 6 - 23 mg/dL   Creatinine, Ser 1.01 0.40 - 1.20 mg/dL   Glucose, Bld 100 (H) 70 - 99 mg/dL   Phosphorus 3.5 2.3 - 4.6 mg/dL   GFR 57.32 (L) >60.00 mL/min  CBC with Differential/Platelet  Result Value Ref Range   WBC 8.6 4.0 - 10.5 K/uL   RBC 4.32 3.87 - 5.11 Mil/uL   Hemoglobin 12.7 12.0 - 15.0 g/dL   HCT 39.3 36.0 - 46.0 %   MCV 91.0 78.0 - 100.0 fl   MCHC 32.4 30.0 - 36.0 g/dL   RDW 12.8 11.5 - 15.5 %   Platelets 247.0 150.0 - 400.0 K/uL   Neutrophils Relative % 65.2 43.0 - 77.0 %   Lymphocytes Relative 19.7 12.0 - 46.0 %   Monocytes Relative 13.3 (H) 3.0 - 12.0 %   Eosinophils Relative 1.0 0.0 - 5.0 %   Basophils Relative 0.8 0.0 - 3.0 %   Neutro Abs 5.6 1.4 - 7.7 K/uL   Lymphs Abs 1.7 0.7 - 4.0 K/uL   Monocytes Absolute 1.1 (H) 0.1 - 1.0 K/uL   Eosinophils Absolute 0.1 0.0 - 0.7 K/uL   Basophils Absolute 0.1 0.0 - 0.1 K/uL       Assessment & Plan:  Over 25 minutes were spent face-to-face with the patient during this encounter and >50% of that time was spent on counseling and coordination of care. I asked her to return for soon f/u 10d visit, will reassess cough at that time.  Problem List Items Addressed This Visit    Chronic bronchitis ([place]) - Primary    Latest eval by Dr Isidore Moos - thought chronic bronchitis with recurrent infections. Tried spiriva  and anoro with poor tolerance - burning in chest - so she stopped both. She recently completed 1 wk course of renally dosed levaquin, without significant improvement. I don't see ongoing bacterial infection so did not recommend any further antibiotics at this time.       Chronic fatigue and malaise   CKD (chronic kidney disease) stage 3, GFR 30-59 ml/min (HCC)    Known h/o this - update labs today (renal panel, CBC).      Relevant Orders   Renal function panel (Completed)   CBC with Differential/Platelet (Completed)   Fibromyalgia   High total serum IgM   History of diverticular abscess    I don't think current GI sxs are related to return of diverticulitis. Check BMP, CBC today.       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 Dr Lebron Conners (hematology) has placed referral to [place] [facility] for further evaluation. Reviewing chart, she seems to have rheumatology appt scheduled for 06/2017. I asked her to contact Dr Lebron Conners office to check about immunology referral - ensure she is scheduled with correct physician.       Intertrigo    Persistent - recommended she increase nystatin powder to BID scheduled.      Torus palatinus    Anticipate benign torus palatinus          Follow up plan: Return in about 10 days (around 04/06/2017) for follow up visit.  [name], MD

## 2017-03-27 NOTE — Assessment & Plan Note (Signed)
Known h/o this - update labs today (renal panel, CBC).

## 2017-03-27 NOTE — Assessment & Plan Note (Addendum)
I don't think current GI sxs are related to return of diverticulitis. Check BMP, CBC today.

## 2017-03-27 NOTE — Assessment & Plan Note (Signed)
Latest eval by Dr Isidore Moos - thought chronic bronchitis with recurrent infections. Tried spiriva and anoro with poor tolerance - burning in chest - so she stopped both. She recently completed 1 wk course of renally dosed levaquin, without significant improvement. I don't see ongoing bacterial infection so did not recommend any further antibiotics at this time.

## 2017-03-27 NOTE — Assessment & Plan Note (Signed)
Persistent - recommended she increase nystatin powder to BID scheduled.

## 2017-04-09 ENCOUNTER — Telehealth: Payer: Self-pay | Admitting: Hematology and Oncology

## 2017-04-09 NOTE — Telephone Encounter (Signed)
Patient called in regarding her referral Ashley [name] said she would work on it and get back in touch with the patient.

## 2017-04-10 ENCOUNTER — Telehealth: Payer: Self-pay

## 2017-04-10 NOTE — Telephone Encounter (Signed)
Patient called with questions about referral made to [facility]. Per progress note on 10/24 by K. Hegarty, records were faxed to [phone]. Patient states someone from [name] called and scheduled an appointment with her. I called [facility] and spoke to receptionist who stated that Allergy, Immunology, and Pulmonology are all encompassed together. I called patient and gave her phone number [phone] to call and verify appointment. Patient will call back if she has any further concerns or questions.

## 2017-04-19 DIAGNOSIS — H6123 Impacted cerumen, bilateral: Secondary | ICD-10-CM | POA: Diagnosis not present

## 2017-04-19 DIAGNOSIS — R070 Pain in throat: Secondary | ICD-10-CM | POA: Diagnosis not present

## 2017-04-19 DIAGNOSIS — J329 Chronic sinusitis, unspecified: Secondary | ICD-10-CM | POA: Diagnosis not present

## 2017-04-27 ENCOUNTER — Telehealth: Payer: Self-pay

## 2017-04-27 NOTE — Telephone Encounter (Signed)
Called to follow up with patient about her appointment at [facility]. Patient stated she has an appointment February 13 th.

## 2017-05-13 ENCOUNTER — Other Ambulatory Visit: Payer: Self-pay | Admitting: Family Medicine

## 2017-05-17 ENCOUNTER — Ambulatory Visit (INDEPENDENT_AMBULATORY_CARE_PROVIDER_SITE_OTHER): Payer: Medicare Other | Admitting: *Deleted

## 2017-05-17 DIAGNOSIS — I5022 Chronic systolic (congestive) heart failure: Secondary | ICD-10-CM

## 2017-05-17 DIAGNOSIS — I428 Other cardiomyopathies: Secondary | ICD-10-CM

## 2017-05-18 ENCOUNTER — Encounter: Payer: Self-pay | Admitting: Cardiology

## 2017-05-18 NOTE — Progress Notes (Signed)
Remote ICD transmission.   

## 2017-05-28 LAB — CUP PACEART REMOTE DEVICE CHECK
Battery Voltage: 2.98 V
Brady Statistic RV Percent Paced: 0.01 %
Date Time Interrogation Session: 20190103154121
HighPow Impedance: 77 Ohm
Implantable Lead Implant Date: 20130301
Implantable Lead Location: 753860
Implantable Lead Model: 6935
Implantable Pulse Generator Implant Date: 20130301
Lead Channel Impedance Value: 475 Ohm
Lead Channel Pacing Threshold Amplitude: 0.5 V
Lead Channel Pacing Threshold Pulse Width: 0.4 ms
Lead Channel Sensing Intrinsic Amplitude: 7.625 mV
Lead Channel Sensing Intrinsic Amplitude: 7.625 mV
Lead Channel Setting Pacing Amplitude: 2.5 V
Lead Channel Setting Pacing Pulse Width: 0.4 ms
Lead Channel Setting Sensing Sensitivity: 0.3 mV

## 2017-06-06 DIAGNOSIS — L570 Actinic keratosis: Secondary | ICD-10-CM | POA: Diagnosis not present

## 2017-06-06 DIAGNOSIS — D3617 Benign neoplasm of peripheral nerves and autonomic nervous system of trunk, unspecified: Secondary | ICD-10-CM | POA: Diagnosis not present

## 2017-06-06 DIAGNOSIS — D485 Neoplasm of uncertain behavior of skin: Secondary | ICD-10-CM | POA: Diagnosis not present

## 2017-06-07 ENCOUNTER — Telehealth: Payer: Self-pay | Admitting: Internal Medicine

## 2017-06-07 NOTE — Telephone Encounter (Signed)
Pt calling stating she has Defib/Pacer in her  She states she had this tingling in the middle of her chest It doesn't happen every day, she states it's been going on for about 3 weeks She states it hasn't bothered her much but is having more of an issue with her Depression She denies any tingling in chest right now.   She states she is having waves of Depression and it is hard to shake off  She has not called to PCP for she thinks it is thinking it could be all related and states if she goes to them they would end up sending her back to [place]   She is also not sure if this is her COPD acting up   Would like advise on this Please call back

## 2017-06-08 NOTE — Telephone Encounter (Signed)
No answer. Left message to call back if still having issues.

## 2017-06-08 NOTE — Telephone Encounter (Signed)
Patient returning call.

## 2017-06-11 NOTE — Telephone Encounter (Signed)
Patient would like a call back did not get previous voicemail still having issues

## 2017-06-12 NOTE — Telephone Encounter (Signed)
I left a message for the patient to call. 

## 2017-06-12 NOTE — Telephone Encounter (Signed)
I spoke with the patient. I advised her I was Dr. Olin Pia nurse and she stated all she wanted to do was schedule an appointment with Dr. [name].  I inquired as to what her concerns were at this time. Per the patient, she is having a "stinging" sensation around her heart/ lungs, but it's "like pain." She is feeling "overwhelmed" at this time and this is causing her to just not feel good. She is getting short of breath when she bends over.  She reports waking up one night with a bad headache and like she couldn't breathe.  Her BP at the time was 90/40 and it is usually 111/58. She also reports insomnia every since her colon was taken out. She also reports that a pulmonary doctor told her she has chronic bronchitis & COPD.  She states she has been 1 year off of her depression medications as she was told she had been on them so long she needed "uppers." The patient was also concerned with her current symptoms that her EF had gone down again as she has had issues with this going up and down.   I advised her her last echo in March 2018 showed an EF of 40-45%.  I also advised her she will need to follow up with her PCP regarding her depression. She stated she wanted to see Dr. [name] to follow up as well. I offered her an appointment for 06/13/17 with Dr. [name], but she declined.  She has been scheduled for 06/19/17 to see Dr. [name].

## 2017-06-13 ENCOUNTER — Other Ambulatory Visit: Payer: Self-pay | Admitting: Cardiovascular Disease

## 2017-06-16 NOTE — Progress Notes (Signed)
Cardiology Office Note  Date:  06/19/2017   ID:  Ashley [name], DOB 1945-09-04, MRN [id number]  PCP:  [name], MD   Chief Complaint  Patient presents with  . Other    patient c/o SOB and chest pain. Patient states she fell yesterday morning and fractured 3 ribs. Meds reviewed verbally with patient.     HPI:  72 y/o female with history of  Nonischemic cardiomyopathy fibromyalgia, irritable bowel,  pneumonia, chronic cough felt secondary to GERD, abnormal PFTs in the past,  shortness of breath dating back to September 2012 that presented acutely,  [facility] showed no significant ischemia,  She received pneumonia vaccine January 2017, had severe reaction previous abdominal surgery May 2015 She presents for follow-up today for follow-up of her Nonischemic cardiomyopathy  s/p slip and fall in bathtub  [place] onto right side.  At least 3 posterior right  rib fractures on CT scan Taking oxycodone 5 mg every 4 hours  still having breakthrough pain Feels like everything is in spasm back and front, difficulty getting comfortable  Chest pain, SOB started before fall Atypical in nature Long discussion concerning her symptoms  Unable to perform EKG today given her pain  Using Lasix sparingly for ankle swelling  Other past medical history reviewed  PNA in 2012 October 2012 Cardiomyopathy ejection fraction  25-35%,  Echo September 2013 with ejection fraction 30-35%, ICD placed EF 50 to 55% in 02/2013, 12/15, And November 2016 PNA vaccine 05/2015 EF 25 to 30% in 06/2015 EF 35 to 40% in 12/2016 EF 40 to 45% in 07/2016   Went to [facility], was cloudy, snowing  Seen by pulmonary, Told that she Has "chronic bronchitis, COPD". 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 Additional workup pending  EKG reviewed by myself on today's visit NSR rate 82 with no significant ST or T-wave changes, left axis  deviation   Other past medical history reviewed Previously participated in cardiac rehabilitation, pulmonary rehabilitation, has not maintained her exercise  Did not tolerate entresto (" too expensive")  Previous Gi problems, cysts ("too much bad bacteria" in her gut) Took ABX 2 weeks Abdomen still giving her trouble but improved  Problems with arthritis, received numerous cortisone shots in her hands  On a previous office visit, we started her on entresto low-dose twice a day for systolic dysfunction.She took the medication until she ran out of the medication on her recent cruise 2-3 weeks agoReports that she did not feel well on the cruise, slept many hours per day, had leg discomfortShe stopped the medication then reports that she felt better when she got home. She does not want to retry the extresto. Also reports it was very expensive for her, $179 per month  Recent stress test showed perfusion abnormality to the anterior wall, significant artifact , attenuation issue secondary to left bundle branch block Cardiogram with ejection fraction 30-35%  Possible severe reaction to pneumonia shot with what sounds like viral symptoms, Requiring prednisone, antibiotics.  chronic abdominal pain, stress from her grandchildren. Previousid ultrasound showing minimal plaque on the left less than 39%, none on the right Previous dehydration with overuse of torsemide Eating more, no regular exercise, sedentary in general secondary to fibromyalgia and fatigue  Previous admission to [facility] in December 2012 for bronchitis and hypoxia as well as systolic CHF. She had antibiotics, prednisone and diuresis and has felt better since discharge. ACE inhibitor placed on hold secondary to chronic  cough.  S/P ICD 10 days ago for EF <35%. Goal weight had been in the low 150 range. Previous Cardiac MRI showed ejection fraction 34 % ICD placed by Dr. Lovena Le.  Previous symptoms of malaise  and fatigue, tachycardia, weight gain, leg swelling . previously taking high doses of diuretics for weight gain causing renal dysfunction, dehydration. For creatinine 1.9 we suggested she cut back on her diuretics. Prior weight was in the low 160 pound range  History of diverticulosis, abscess formation, perforation requiring a long course of antibiotics with PICC line, subsequent CT scan Sep 30 2013 showing free air, abscess requiring partial colectomy. She has an ostomy. Today she has lost 22 pounds by her report.   Previous echocardiogram shows improvement in her ejection fraction up to 50%. Challenging image quality. On my own review, it does appear at least 45, if not 50%.   PMH:   has a past medical history of Abdominal aortic atherosclerosis ([place]) (11/2015), Allergy, Anemia, Anxiety, Automatic implantable cardioverter-defibrillator in situ (2012), Bronchiectasis, Cancer ([place]), Chest pain, Chronic sinusitis, Chronic systolic CHF (congestive heart failure) ([place]), Depression, Diarrhea (07/14/2008), Disorder of vocal cords, Essential hypertension, Fibrocystic breast disease, Fibromyalgia, GERD (gastroesophageal reflux disease), H/O multiple allergies (10/10/2013), Hemorrhoids, History of diverticulitis of colon, History of pneumonia (2012), Insomnia, Irritable bowel syndrome, Laryngeal nodule, Lung disease (07/14/11), Migraine without aura, without mention of intractable migraine without mention of status migrainosus, Mitral regurgitation, Nonischemic cardiomyopathy ([place]), Osteoarthritis, Osteoporosis (01/2013), Pneumonia (2011), PONV (postoperative nausea and vomiting), Pre-syncope, Pure hypercholesterolemia, Sigmoid diverticulitis with abscess, perforation and fistula formation (09/25/2013), sigmoid diverticulitis with perforation, abscess and fistula (09/30/2013), and Sleep apnea (2010).  PSH:    Past Surgical History:  Procedure Laterality Date  . ABDOMINAL HYSTERECTOMY  1987   w/BSO  . ANAL RECTAL  MANOMETRY N/A 02/02/2016   Procedure: ANO RECTAL MANOMETRY;  Surgeon: Mauri Pole, MD;  Location: [facility];  Service: Endoscopy;  Laterality: N/A;  . APPENDECTOMY    . AUGMENTATION MAMMAPLASTY    . BREAST BIOPSY     "I've had 2-3 bx; all benign"  . breast cystectomies     due to FCBD  . BREAST MASS EXCISION  01/2010   Left-fibrocystic change w/intraductal papilloma, no malignancy  . carotid US  07/90   3-30% LICA, normal RICA  . [place] Bunionectomy  11/04/08   Right Great Toe (Dr. Beola Cord)  . COLONOSCOPY  1998   nml (sigmoid-Gilbert-nol)  . Colonoscopy  01/04/04   polyps, diverticulosis/severe  . COLOSTOMY N/A 10/02/2013   DESCENDING COLOSTOMY;  Surgeon: Imogene Burn. Tsuei, MD  . Derrill Memo REVERSAL N/A 02/10/2014   [name], MD  . CT chest  12/2010   possible bronchiectasis lower lobes, 02/2011 - enlarged bilateral effusions, bibasilar atx  . CT chest  04/2011   no PE.  abnormal R hilar and mediastinal adenopathy --> on rpt 05/2011, resolved  . DEXA  03/28/99   osteopenia L/S  . DEXA  03/12/03   2.2 Spine 0.1 Hip  Osteopenia  . DEXA  11/14/05   1.9 Spine 0.3 Hip  slight improvement  . DEXA  11/22/06   improved osteo and fem neck  . DEXA  01/2013   T score -2.6 at spine  . EMG/MCV  10/16/01   + mild carpal tunnel  . ESOPHAGOGASTRODUODENOSCOPY  09/2014   small HH, irregular Z line, fundic gland polyps with mild gastritis ([place])  . Flex laryngoscopy  06/11/06   ([place]) nml  . Head MRI, Head CT  10/1998   ? H/A focus ([place])  . ICD placement  07/14/11   w/pacemaker  . IMPLANTABLE CARDIOVERTER DEFIBRILLATOR IMPLANT N/A 07/14/2011   IMPLANTABLE CARDIOVERTER DEFIBRILLATOR IMPLANT;  Surgeon: Evans Lance, MD;  Location: [facility] CATH LAB;  Service: Cardiovascular;  Laterality: N/A;  . KNEE ARTHROSCOPY W/ ORIF     Left; "meniscus tear"  . KNEE SURGERY     left  . L/S films  11/21/01   nml; Right hip nml  . LASIK     left eye  . MANDIBLE SURGERY     "got about 6 pins in  the bottom of my jaw"  . MANDIBLE SURGERY    . MRI Right Hip  12/12/01   Tendonitis Gluteus Medius to Gtr Trochanter Neg Bursitis (Dr. Eulas Post)  . MRI U/S  11/21/01   min. DDD L/3-4  . NCS  11/2014   L ulnar neuropathy, mild B median nerve entrapment ([place])  . OSTEOTOMY     Left foot  . PARTIAL COLECTOMY N/A 10/02/2013   PARTIAL COLECTOMY;  Surgeon: Imogene Burn. Tsuei, MD  . sinus surgery     x3 with balloon   . stress myoview  02/2011   no significant ischemia  . TOE AMPUTATION     "toe beside baby toe on left foot; got gangrene from corn"  . TONSILLECTOMY AND ADENOIDECTOMY    . US ECHOCARDIOGRAPHY  04/2011   mildly dilated LV, EF 25-30%, hypokinesis, restrictive physiology, mod MR, no AS    Current Outpatient Medications  Medication Sig Dispense Refill  . acetaminophen (TYLENOL) 500 MG tablet Take 500 mg by mouth daily as needed (PAIN).    Marland Kitchen albuterol (PROVENTIL HFA;VENTOLIN HFA) 108 (90 Base) MCG/ACT inhaler Inhale 2 puffs into the lungs every 6 (six) hours as needed for wheezing or shortness of breath. 1 Inhaler 0  . alendronate (FOSAMAX) 70 MG tablet Take 70 mg by mouth once a week. Patient takes every Thursday    . AMBULATORY NON FORMULARY MEDICATION Medication Name:  1 squatty potty to use as needed 1 each 0  . Ascorbic Acid (VITAMIN C) 1000 MG tablet Take 1,000 mg by mouth daily.    Marland Kitchen aspirin EC 81 MG tablet Take 81 mg by mouth daily.    Marland Kitchen BIOTIN PO Take 1 tablet by mouth daily.    . Calcium Carbonate-Vitamin D (CALCIUM + D PO) Take 1 tablet by mouth daily.    . carvedilol (COREG) 6.25 MG tablet TAKE 1 TABLET (6.25 MG TOTAL) BY MOUTH 2 (TWO) TIMES DAILY WITH A MEAL. 180 tablet 3  . cholecalciferol (VITAMIN D) 1000 units tablet Take 2,000 Units by mouth daily.    . Coenzyme Q10 (CO Q 10 PO) Take 1 tablet by mouth daily.    Marland Kitchen dextromethorphan (DELSYM) 30 MG/5ML liquid Take 15 mg by mouth at bedtime as needed for cough.    . ezetimibe (ZETIA) 10 MG tablet TAKE 1 TABLET (10 MG  TOTAL) BY MOUTH DAILY. 30 tablet 5  . GARLIC PO Take 1 tablet by mouth daily.    . magic mouthwash SOLN Take 5 mLs 3 (three) times daily as needed by mouth for mouth pain. 120 mL 0  . nystatin (MYCOSTATIN/NYSTOP) powder Apply topically 2 (two) times daily as needed. 30 g 0  . omeprazole (PRILOSEC) 40 MG capsule Take 1 capsule (40 mg total) by mouth daily.    Marland Kitchen oxyCODONE (ROXICODONE) 5 MG immediate release tablet Take 1 tablet (5 mg total) by mouth every  4 (four) hours as needed for severe pain. 20 tablet 0  . potassium chloride (K-DUR) 10 MEQ tablet Take 10 mEq by mouth as directed. Take daily as needed    . Pyridoxine HCl (VITAMIN B6 PO) Take 1 capsule by mouth daily. Reported on 07/12/2015    . Red Yeast Rice 600 MG CAPS Take 1 capsule (600 mg total) by mouth 2 (two) times daily.    Marland Kitchen spironolactone (ALDACTONE) 25 MG tablet TAKE 1 TABLET BY MOUTH DAILY 90 tablet 3  . TOLAK 4 % CREA Take 1 application by mouth. EVERYDAY FOR 2 TO 3 WEEKS TO LESION ON BACK  0  . torsemide (DEMADEX) 10 MG tablet Take 10 mg by mouth daily as needed (swelling/fluid).    Marland Kitchen umeclidinium-vilanterol (ANORO ELLIPTA) 62.5-25 MCG/INH AEPB Inhale 1 puff into the lungs daily. 60 each 5  . vitamin E 100 UNIT capsule Take 100 Units by mouth daily.    Marland Kitchen losartan (COZAAR) 50 MG tablet Take 1 tablet (50 mg total) by mouth daily. (Patient not taking: Reported on 06/18/2017) 30 tablet 6   No current facility-administered medications for this visit.      Allergies:   Antihistamines, chlorpheniramine-type; Codeine; Cyclobenzaprine; Cymbalta [duloxetine hcl]; Influenza vac split [flu virus vaccine]; Prevnar [pneumococcal 13-val conj vacc]; Saline; Sulfasalazine; Atorvastatin; Chlorhexidine; Dilaudid [hydromorphone hcl]; Latex; Morphine and related; Pravastatin; Prevacid [lansoprazole]; Red dye; Rosuvastatin; Simvastatin; Diflucan [fluconazole]; Other; Penicillins; Sertraline; Tape; and Metaxalone   Social History:  The patient  reports  that  has never smoked. she has never used smokeless tobacco. She reports that she does not drink alcohol or use drugs.   Family History:   family history includes Alcohol abuse in her paternal grandfather; Arthritis in her mother; Colon polyps in her father; Dementia in her mother; Emphysema in her mother; Heart disease in her paternal grandfather and paternal grandmother; Hypertension in her maternal grandfather; Lung cancer in her father; Osteoporosis in her mother; Stroke in her mother and paternal grandfather.    Review of Systems: Review of Systems  Respiratory: Positive for shortness of breath.   Cardiovascular: Positive for chest pain.  Gastrointestinal: Positive for abdominal pain.  Musculoskeletal: Positive for back pain.       Legs are sore to touch  Psychiatric/Behavioral: Negative.   All other systems reviewed and are negative.    PHYSICAL EXAM: VS:  BP 111/75 (BP Location: Right Arm, Patient Position: Sitting, Cuff Size: Normal)   Pulse 89   Ht 5\' 5"  (1.651 m)   Wt 180 lb (81.6 kg)   BMI 29.95 kg/m   , BMI Body mass index is 29.95 kg/m. GEN: Well nourished, well developed, in no acute distress , obese HEENT: normal  Neck: no JVD, carotid bruits, or masses Cardiac: RRR; no murmurs, rubs, or gallops,no edema  Respiratory:  clear to auscultation bilaterally, normal work of breathing GI: soft, nontender, nondistended, + BS MS: no deformity or atrophy  Skin: warm and dry, no rash Neuro:  Strength and sensation are intact Psych: euthymic mood, full affect     Recent Labs: 02/14/2017: ALT 17 06/18/2017: BUN 23; Creatinine, Ser 0.89; Hemoglobin 12.8; Platelets 285; Potassium 4.6; Sodium 142    Lipid Panel Lab Results  Component Value Date   CHOL 240 (H) 09/20/2016   HDL 44.10 09/20/2016   LDLCALC 109 (H) 05/27/2014   TRIG 224.0 (H) 09/20/2016      Wt Readings from Last 3 Encounters:  06/19/17 180 lb (81.6 kg)  06/18/17 179 lb (  81.2 kg)  03/27/17 178 lb  (80.7 kg)    12/2015: weight 171   ASSESSMENT AND PLAN:  Chronic systolic CHF (congestive heart failure) (HCC) -  No changes to her medication regimen Previous echo with  slight improvement of her ejection fraction Did not tolerate entresto. Found it  Expensive We will continue current medications, torsemide as needed  Cardiomyopathy, dilated, nonischemic (HCC) - No further testing ordered Does not appear volume overloaded  Essential hypertension - Blood pressure is well controlled on today's visit. No changes made to the medications.  Rib fractures Following a fall 3 posterior rib fractures nondisplaced Having difficulty with pain control Scheduled to see primary care end of this week Unclear if she might benefit from muscle relaxer in addition to pain pill  ICD (implantable cardioverter-defibrillator) in place -  Followed by Dr. Lovena Le.   Chronic fatigue Likely multifactorial, sedentary condition, fibromyalgia   Total encounter time more than 25 minutes  Greater than 50% was spent in counseling and coordination of care with the patient   Disposition:   F/U  6 months   No orders of the defined types were placed in this encounter.    Signed, Esmond Plants, M.D., Ph.D. 06/19/2017  [place] [phone]\

## 2017-06-18 ENCOUNTER — Other Ambulatory Visit: Payer: Self-pay

## 2017-06-18 ENCOUNTER — Encounter (HOSPITAL_COMMUNITY): Payer: Self-pay

## 2017-06-18 ENCOUNTER — Emergency Department (HOSPITAL_COMMUNITY): Payer: Medicare Other

## 2017-06-18 ENCOUNTER — Emergency Department (HOSPITAL_COMMUNITY)
Admission: EM | Admit: 2017-06-18 | Discharge: 2017-06-18 | Disposition: A | Discharge status: Home / Self Care | Payer: Medicare Other | Attending: Emergency Medicine | Admitting: Emergency Medicine

## 2017-06-18 DIAGNOSIS — W010XXA Fall on same level from slipping, tripping and stumbling without subsequent striking against object, initial encounter: Secondary | ICD-10-CM

## 2017-06-18 DIAGNOSIS — R1031 Right lower quadrant pain: Secondary | ICD-10-CM | POA: Diagnosis not present

## 2017-06-18 DIAGNOSIS — Y92002 Bathroom of unspecified non-institutional (private) residence single-family (private) house as the place of occurrence of the external cause: Secondary | ICD-10-CM | POA: Diagnosis not present

## 2017-06-18 DIAGNOSIS — I5022 Chronic systolic (congestive) heart failure: Secondary | ICD-10-CM | POA: Diagnosis not present

## 2017-06-18 DIAGNOSIS — S299XXA Unspecified injury of thorax, initial encounter: Secondary | ICD-10-CM | POA: Diagnosis present

## 2017-06-18 DIAGNOSIS — R109 Unspecified abdominal pain: Secondary | ICD-10-CM | POA: Diagnosis not present

## 2017-06-18 DIAGNOSIS — Z7982 Long term (current) use of aspirin: Secondary | ICD-10-CM | POA: Diagnosis not present

## 2017-06-18 DIAGNOSIS — N183 Chronic kidney disease, stage 3 (moderate): Secondary | ICD-10-CM | POA: Insufficient documentation

## 2017-06-18 DIAGNOSIS — W16212A Fall in (into) filled bathtub causing other injury, initial encounter: Secondary | ICD-10-CM | POA: Insufficient documentation

## 2017-06-18 DIAGNOSIS — Y93E1 Activity, personal bathing and showering: Secondary | ICD-10-CM | POA: Diagnosis not present

## 2017-06-18 DIAGNOSIS — S3690XA Unspecified injury of unspecified intra-abdominal organ, initial encounter: Secondary | ICD-10-CM | POA: Diagnosis not present

## 2017-06-18 DIAGNOSIS — R100 Acute abdomen: Secondary | ICD-10-CM | POA: Diagnosis not present

## 2017-06-18 DIAGNOSIS — Y999 Unspecified external cause status: Secondary | ICD-10-CM | POA: Diagnosis not present

## 2017-06-18 DIAGNOSIS — S2241XA Multiple fractures of ribs, right side, initial encounter for closed fracture: Secondary | ICD-10-CM | POA: Diagnosis not present

## 2017-06-18 DIAGNOSIS — I130 Hypertensive heart and chronic kidney disease with heart failure and stage 1 through stage 4 chronic kidney disease, or unspecified chronic kidney disease: Secondary | ICD-10-CM | POA: Diagnosis not present

## 2017-06-18 LAB — CBC
HCT: 37.6 % (ref 36.0–46.0)
Hemoglobin: 12.8 g/dL (ref 12.0–15.0)
MCH: 30 pg (ref 26.0–34.0)
MCHC: 34 g/dL (ref 30.0–36.0)
MCV: 88.1 fL (ref 78.0–100.0)
Platelets: 285 10*3/uL (ref 150–400)
RBC: 4.27 MIL/uL (ref 3.87–5.11)
RDW: 13.2 % (ref 11.5–15.5)
WBC: 13.4 10*3/uL — ABNORMAL HIGH (ref 4.0–10.5)

## 2017-06-18 LAB — BASIC METABOLIC PANEL
Anion gap: 8 (ref 5–15)
BUN: 23 mg/dL — ABNORMAL HIGH (ref 6–20)
CO2: 26 mmol/L (ref 22–32)
Calcium: 10.1 mg/dL (ref 8.9–10.3)
Chloride: 108 mmol/L (ref 101–111)
Creatinine, Ser: 0.89 mg/dL (ref 0.44–1.00)
GFR calc Af Amer: >60 mL/min (ref >60)
GFR calc non Af Amer: >60 mL/min (ref >60)
Glucose, Bld: 104 mg/dL — ABNORMAL HIGH (ref 65–99)
Potassium: 4.6 mmol/L (ref 3.5–5.1)
Sodium: 142 mmol/L (ref 135–145)

## 2017-06-18 MED ORDER — IOPAMIDOL (ISOVUE-300) INJECTION 61%
INTRAVENOUS | Status: AC
  Administered 2017-06-18: 100 mL
  Filled 2017-06-18: qty 100

## 2017-06-18 MED ORDER — OXYCODONE HCL 5 MG PO TABS: 5.0000 mg | ORAL_TABLET | ORAL | 0 refills | Status: DC | PRN

## 2017-06-18 MED ORDER — OXYCODONE HCL 5 MG PO TABS
2.5000 mg | ORAL_TABLET | Freq: Once | ORAL | Status: AC
  Administered 2017-06-18: 2.5 mg via ORAL
  Filled 2017-06-18: qty 1

## 2017-06-18 NOTE — Discharge Instructions (Signed)
It was our pleasure to provide your ER care today - we hope that you feel better.  Take oxycodone as prescribed, as need for pain.   Use incentive spirometer - 10 full and complete breaths in, every hour while awake.   Follow up with primary care doctor in 1 week.  Return to ER if worse, new symptoms, increased trouble breathing, fevers, new or intractable pain, other concern.   No driving when taking oxycodone, or when impaired by the pain of the rib fractures.

## 2017-06-18 NOTE — ED Notes (Signed)
Pt reports that she may have slipped on some soap and when she fell hit her rt side ribs to the tub. Pt reports 6/10 pain that worsens with coughing and movement. Pt has no apparent deformities noted. Pt reports having PM/ICD.

## 2017-06-18 NOTE — ED Notes (Signed)
Bed: WA07 Expected date:  Expected time:  Means of arrival:  Comments: EMS-fall 

## 2017-06-18 NOTE — ED Provider Notes (Signed)
[facility] DEPT Provider Note   CSN: [id number] Arrival date & time: 06/18/17  1119     History   Chief Complaint Chief Complaint  Patient presents with  . Fall    HPI Ashley [name] is a 72 y.o. female.  Patient s/p slip and fall in bathtub this AM. [place] onto right side. C/o right abd and right rib/chest wall pain. Pain constant, dull, moderate-severe, worse w certain movements and positional changes. No sob. No vomiting. No fever or chills. Denies head injury or headache. No neck pain. No nv. Felt fine/at baseline prior to slip and fall. Has been ambulatory since fall. Denies other pain or injury.    The history is provided by the patient.  Fall  Pertinent negatives include no chest pain, no headaches and no shortness of breath.    Past Medical History:  Diagnosis Date  . Abdominal aortic atherosclerosis ([place]) 11/2015   by CT  . Allergy   . Anemia   . Anxiety   . Automatic implantable cardioverter-defibrillator in situ 2012   a. MDT single lead ICD.  Marland Kitchen Bronchiectasis    a. possibly mild, treat URIs aggressively  . Cancer (HCC)    basal cell, arms & neck  . Chest pain    a. prev neg MV.  . Chronic sinusitis   . Chronic systolic CHF (congestive heart failure) (HCC)    a. EF 25-35%, LV dilation, significant MR, goal weight 150lbs;  b. 04/2014 Echo: EF 50-55%, Gr 1 DD.  Marland Kitchen Depression   . Diarrhea 07/14/2008   Qualifier: Diagnosis of  By: Glori Bickers MD, Carmell Austria   . Disorder of vocal cords   . Essential hypertension   . Fibrocystic breast disease   . Fibromyalgia   . GERD (gastroesophageal reflux disease)   . H/O multiple allergies 10/10/2013  . Hemorrhoids   . History of diverticulitis of colon   . History of pneumonia 2012  . Insomnia   . Irritable bowel syndrome   . Laryngeal nodule    Dr. Thomasena Edis  . Lung disease 07/14/11  . Migraine without aura, without mention of intractable migraine without mention of status migrainosus   .  Mitral regurgitation   . Nonischemic cardiomyopathy ([place])    a. Recovered LV fxn - prev as low as 25%, now 50-55% by echo 04/2014;  b. 2012 s/p MDT [id number] [place] VR single lead ICD.  Marland Kitchen Osteoarthritis    knees, back, hands, low back,   . Osteoporosis 01/2013   T-2.7 spine (01/2016)  . Pneumonia 2011  . PONV (postoperative nausea and vomiting)   . Pre-syncope   . Pure hypercholesterolemia   . Sigmoid diverticulitis with abscess, perforation and fistula formation 09/25/2013  . sigmoid diverticulitis with perforation, abscess and fistula 09/30/2013  . Sleep apnea 2010   borderline, inconclusive- /w [place]     Patient Active Problem List   Diagnosis Date Noted  . 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 High total serum IgM 11/22/2016  . 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 Bleeding external hemorrhoids 10/31/2016  . LBBB (left bundle branch block) 07/11/2016  . Gastritis 06/12/2016  . Bladder wall thickening 02/16/2016  . Fecal incontinence   . Abdominal pain 11/22/2015  . Abdominal aortic atherosclerosis ([place]) 11/13/2015  . 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  . Hoarseness 06/21/2015  . Hypersensitivity to pneumococcal vaccine 06/10/2015  . Medicare annual wellness visit, subsequent 06/08/2015  . Advanced care planning/counseling discussion 06/08/2015  . Intertrigo 04/06/2015  . Chronic daily  headache 04/06/2015  . Neuropathy 04/06/2015  . Essential hypertension   . Nonischemic cardiomyopathy ([place])   . Chest pain and shortness of breath 12/25/2014  . S/P colostomy takedown 02/10/2014  . ICD (implantable cardioverter-defibrillator) in place 10/23/2013  . H/O multiple allergies 10/10/2013  . History of diverticular abscess 09/25/2013  . Osteoporosis 01/13/2013  . Irregular heart beat 08/06/2012  . MDD (major depressive disorder), recurrent episode, moderate ([place]) 07/09/2012  . Chronic recurrent sinusitis 06/17/2012  . Vitamin D deficiency 03/05/2012  . Essential tremor 12/29/2011  . GERD  (gastroesophageal reflux disease) 11/13/2011  . Allergic rhinitis 11/13/2011  . CKD (chronic kidney disease) stage 3, GFR 30-59 ml/min (HCC) 11/09/2011  . Epistaxis, recurrent 10/11/2011  . Orthostatic hypotension 04/30/2011  . Cardiomyopathy, dilated, nonischemic ([place]) 03/01/2011  . Mitral regurgitation 02/24/2011  . Chronic systolic CHF (congestive heart failure) ([place]) 02/24/2011  . Chronic bronchitis ([place]) 01/20/2011  . Chronic fatigue and malaise 11/02/2010  . Anxiety state 08/18/2010  . 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  . Dyslipidemia 11/22/2006  . COMMON MIGRAINE 11/22/2006  . Irritable bowel syndrome 11/22/2006  . FIBROCYSTIC BREAST DISEASE 11/22/2006  . Osteoarthritis 11/22/2006  . Fibromyalgia 11/22/2006    Past Surgical History:  Procedure Laterality Date  . ABDOMINAL HYSTERECTOMY  1987   w/BSO  . ANAL RECTAL MANOMETRY N/A 02/02/2016   Procedure: ANO RECTAL MANOMETRY;  Surgeon: Mauri Pole, MD;  Location: [facility];  Service: Endoscopy;  Laterality: N/A;  . APPENDECTOMY    . AUGMENTATION MAMMAPLASTY    . BREAST BIOPSY     "I've had 2-3 bx; all benign"  . breast cystectomies     due to FCBD  . BREAST MASS EXCISION  01/2010   Left-fibrocystic change w/intraductal papilloma, no malignancy  . carotid US  79/0240   9-73% LICA, normal RICA  . [employer] Bunionectomy  11/04/08   Right Great Toe (Dr. Beola Cord)  . COLONOSCOPY  1998   nml (sigmoid-Gilbert-nol)  . Colonoscopy  01/04/04   polyps, diverticulosis/severe  . COLOSTOMY N/A 10/02/2013   DESCENDING COLOSTOMY;  Surgeon: Imogene Burn. Tsuei, MD  . Derrill Memo REVERSAL N/A 02/10/2014   [name], MD  . CT chest  12/2010   possible bronchiectasis lower lobes, 02/2011 - enlarged bilateral effusions, bibasilar atx  . CT chest  04/2011   no PE.  abnormal R hilar and mediastinal adenopathy --> on rpt 05/2011, resolved  . DEXA  03/28/99   osteopenia L/S  . DEXA  03/12/03   2.2 Spine 0.1 Hip  Osteopenia  .  DEXA  11/14/05   1.9 Spine 0.3 Hip  slight improvement  . DEXA  11/22/06   improved osteo and fem neck  . DEXA  01/2013   T score -2.6 at spine  . EMG/MCV  10/16/01   + mild carpal tunnel  . ESOPHAGOGASTRODUODENOSCOPY  09/2014   small HH, irregular Z line, fundic gland polyps with mild gastritis ([place])  . Flex laryngoscopy  06/11/06   ([place]) nml  . Head MRI, Head CT  10/1998   ? H/A focus ([place])  . ICD placement  07/14/11   w/pacemaker  . IMPLANTABLE CARDIOVERTER DEFIBRILLATOR IMPLANT N/A 07/14/2011   IMPLANTABLE CARDIOVERTER DEFIBRILLATOR IMPLANT;  Surgeon: Evans Lance, MD;  Location: [facility] CATH LAB;  Service: Cardiovascular;  Laterality: N/A;  . KNEE ARTHROSCOPY W/ ORIF     Left; "meniscus tear"  . KNEE SURGERY     left  . L/S films  11/21/01   nml;  Right hip nml  . LASIK     left eye  . MANDIBLE SURGERY     "got about 6 pins in the bottom of my jaw"  . MANDIBLE SURGERY    . MRI Right Hip  12/12/01   Tendonitis Gluteus Medius to Gtr Trochanter Neg Bursitis (Dr. Eulas Post)  . MRI U/S  11/21/01   min. DDD L/3-4  . NCS  11/2014   L ulnar neuropathy, mild B median nerve entrapment ([place])  . OSTEOTOMY     Left foot  . PARTIAL COLECTOMY N/A 10/02/2013   PARTIAL COLECTOMY;  Surgeon: Imogene Burn. Tsuei, MD  . sinus surgery     x3 with balloon   . stress myoview  02/2011   no significant ischemia  . TOE AMPUTATION     "toe beside baby toe on left foot; got gangrene from corn"  . TONSILLECTOMY AND ADENOIDECTOMY    . US ECHOCARDIOGRAPHY  04/2011   mildly dilated LV, EF 25-30%, hypokinesis, restrictive physiology, mod MR, no AS    OB History    No data available       Home Medications    Prior to Admission medications   Medication Sig Start Date End Date Taking? Authorizing Provider  albuterol (PROVENTIL HFA;VENTOLIN HFA) 108 (90 Base) MCG/ACT inhaler Inhale 2 puffs into the lungs every 6 (six) hours as needed for wheezing or shortness of breath. 07/25/16   Jearld Fenton, NP    alendronate (FOSAMAX) 70 MG tablet Take 70 mg by mouth once a week. Patient takes every Thursday    [provider]  ALPRAZolam Duanne Moron) 0.5 MG tablet Take 0.5 tablets (0.25 mg total) by mouth at bedtime as needed for sleep. 09/08/16   [name], MD  AMBULATORY NON FORMULARY MEDICATION Medication Name:  1 squatty potty to use as needed 01/20/16   Mauri Pole, MD  Ascorbic Acid (VITAMIN C) 1000 MG tablet Take 1,000 mg by mouth daily.    [provider]  aspirin EC 81 MG tablet Take 81 mg by mouth daily.    [provider]  Calcium Carbonate-Vitamin D (CALCIUM + D PO) Take 1 tablet by mouth daily.    [provider]  carvedilol (COREG) 6.25 MG tablet TAKE 1 TABLET (6.25 MG TOTAL) BY MOUTH 2 (TWO) TIMES DAILY WITH A MEAL. 06/13/17   Minna Merritts, MD  cholecalciferol (VITAMIN D) 1000 units tablet Take 2,000 Units by mouth daily.    [provider]  dextromethorphan (DELSYM) 30 MG/5ML liquid Take 15 mg by mouth at bedtime as needed for cough.    [provider]  ezetimibe (ZETIA) 10 MG tablet TAKE 1 TABLET (10 MG TOTAL) BY MOUTH DAILY. 05/14/17   [name], MD  losartan (COZAAR) 50 MG tablet Take 1 tablet (50 mg total) by mouth daily. 12/21/16 03/21/17  Theora Gianotti, NP  magic mouthwash SOLN Take 5 mLs 3 (three) times daily as needed by mouth for mouth pain. 03/19/17   Jearld Fenton, NP  nystatin (MYCOSTATIN/NYSTOP) powder Apply topically 2 (two) times daily as needed. 09/27/16   [name], MD  omeprazole (PRILOSEC) 40 MG capsule Take 1 capsule (40 mg total) by mouth daily. 05/23/16   [name], MD  potassium chloride (K-DUR) 10 MEQ tablet Take 10 mEq by mouth as directed. Take daily as needed    [provider]  Pyridoxine HCl (VITAMIN B6 PO) Take 1 capsule by mouth daily. Reported on 07/12/2015    [provider]  Red Yeast Rice 600 MG CAPS Take 1 capsule (600 mg total) by mouth 2  (two) times daily. 06/12/16   [name], MD  spironolactone (ALDACTONE) 25 MG tablet TAKE 1 TABLET BY MOUTH DAILY 01/18/17   Minna Merritts, MD  torsemide (DEMADEX) 10 MG tablet Take 10 mg by mouth daily as needed (swelling/fluid).    [provider]  triamcinolone (NASACORT) 55 MCG/ACT AERO nasal inhaler Place 2 sprays into the nose 2 (two) times daily.    [provider]  umeclidinium-vilanterol (ANORO ELLIPTA) 62.5-25 MCG/INH AEPB Inhale 1 puff into the lungs daily. 01/09/17   Laverle Hobby, MD  vitamin E 100 UNIT capsule Take 100 Units by mouth daily.    [provider]  bisoprolol (ZEBETA) 5 MG tablet Take 5 mg by mouth daily. 06/06/11 07/24/11  Minna Merritts, MD    Family History Family History  Problem Relation Age of Onset  . Lung cancer Father        + smoker  . Colon polyps Father   . Dementia Mother   . Osteoporosis Mother        Lumbar spine  . Stroke Mother        x 5 @ 67 YOA  . Arthritis Mother        hands  . Emphysema Mother   . Alcohol abuse Paternal Grandfather   . Stroke Paternal Grandfather        ?  Marland Kitchen Heart disease Paternal Grandfather   . Hypertension Maternal Grandfather        ?  Marland Kitchen Heart disease Paternal Grandmother   . Colon cancer Neg Hx   . Esophageal cancer Neg Hx   . Gallbladder disease Neg Hx     Social History Social History   Tobacco Use  . Smoking status: Never Smoker  . Smokeless tobacco: Never Used  . Tobacco comment: Lived with smokers x 23 yrs, smoked herself "for a week"  Substance Use Topics  . Alcohol use: No    Alcohol/week: 0.0 oz  . Drug use: No     Allergies   Antihistamines, chlorpheniramine-type; Codeine; Cyclobenzaprine; Cymbalta [duloxetine hcl]; Influenza vac split [flu virus vaccine]; Prevnar [pneumococcal 13-val conj vacc]; Sulfasalazine; Atorvastatin; Chlorhexidine; Dilaudid [hydromorphone hcl]; Latex; Morphine and related; Pravastatin; Prevacid [lansoprazole]; Red dye;  Rosuvastatin; Simvastatin; Diflucan [fluconazole]; Other; Penicillins; Sertraline; Tape; and Metaxalone   Review of Systems Review of Systems  Constitutional: Negative for fever.  HENT: Negative for nosebleeds.   Eyes: Negative for visual disturbance.  Respiratory: Negative for shortness of breath.   Cardiovascular: Negative for chest pain.  Gastrointestinal: Negative for vomiting.  Genitourinary: Positive for flank pain. Negative for hematuria.  Musculoskeletal: Negative for back pain and neck pain.  Skin: Negative for wound.  Neurological: Negative for weakness, numbness and headaches.  Hematological: Does not bruise/bleed easily.  Psychiatric/Behavioral: Negative for confusion.     Physical Exam Updated Vital Signs BP (!) 114/54 (BP Location: Right Arm)   Pulse 70   Temp 98.1 F (36.7 C) (Oral)   Resp 20   Ht 1.664 m (5' 5.5")   Wt 81.2 kg (179 lb)   SpO2 96%   BMI 29.33 kg/m   Physical Exam  Constitutional: She is oriented to person, place, and time. She appears well-developed and well-nourished. No distress.  HENT:  Head: Atraumatic.  Mouth/Throat: Oropharynx is clear and moist.  Eyes: Conjunctivae are normal. Pupils are equal, round, and reactive to light. No scleral icterus.  Neck: Neck supple. No tracheal deviation present.  Cardiovascular: Normal rate, regular rhythm, normal heart sounds and intact distal pulses.  Pulmonary/Chest: Effort normal and breath sounds normal. No respiratory distress. She exhibits tenderness.  Right chest wall tenderness. Normal chest wall movement. No crepitus.   Abdominal: Soft. Normal appearance. She exhibits no distension. There is tenderness. There is no guarding.  Right abd tenderness.   Genitourinary:  Genitourinary Comments: No cva tenderness  Musculoskeletal: She exhibits no edema.  CTLS spine, non tender, aligned, no step off. Good rom bil extremities without pain or focal bony tenderness.   Neurological: She is alert and  oriented to person, place, and time.  Speech clear/fluent. Motor intact bil. sens grossly intact.   Skin: Skin is warm and dry. No rash noted. She is not diaphoretic.  Psychiatric: She has a normal mood and affect.  Nursing note and vitals reviewed.    ED Treatments / Results  Labs (all labs ordered are listed, but only abnormal results are displayed) Results for orders placed or performed during the hospital encounter of 06/18/17  CBC  Result Value Ref Range   WBC 13.4 (H) 4.0 - 10.5 K/uL   RBC 4.27 3.87 - 5.11 MIL/uL   Hemoglobin 12.8 12.0 - 15.0 g/dL   HCT 37.6 36.0 - 46.0 %   MCV 88.1 78.0 - 100.0 fL   MCH 30.0 26.0 - 34.0 pg   MCHC 34.0 30.0 - 36.0 g/dL   RDW 13.2 11.5 - 15.5 %   Platelets 285 150 - 400 K/uL  Basic metabolic panel  Result Value Ref Range   Sodium 142 135 - 145 mmol/L   Potassium 4.6 3.5 - 5.1 mmol/L   Chloride 108 101 - 111 mmol/L   CO2 26 22 - 32 mmol/L   Glucose, Bld 104 (H) 65 - 99 mg/dL   BUN 23 (H) 6 - 20 mg/dL   Creatinine, Ser 0.89 0.44 - 1.00 mg/dL   Calcium 10.1 8.9 - 10.3 mg/dL   GFR calc non Af Amer >60 >60 mL/min   GFR calc Af Amer >60 >60 mL/min   Anion gap 8 5 - 15   Dg Ribs Unilateral W/chest Right  Result Date: 06/18/2017 CLINICAL DATA:  Fall on bathtub.  Rib pain. COMPARISON:  12/11/2016 and 12/06/2016. FINDINGS: Left chest wall ICD is noted with lead in the right ventricle. Normal heart size. No pleural effusions or edema. No airspace opacities. No displaced rib fractures identified. IMPRESSION: No rib fractures visualized. Electronically Signed   By: Kerby Moors M.D.   On: 06/18/2017 13:41   Ct Abdomen Pelvis W Contrast  Result Date: 06/18/2017 CLINICAL DATA:  Slipped in the shower, striking the right side with severe pain with cough and movement. EXAM: CT ABDOMEN AND PELVIS WITH CONTRAST TECHNIQUE: Multidetector CT imaging of the abdomen and pelvis was performed using the standard protocol following bolus administration of  intravenous contrast. CONTRAST:  174mL ISOVUE-300 IOPAMIDOL (ISOVUE-300) INJECTION 61% COMPARISON:  Chest radiography same day. FINDINGS: Lower chest: Lung bases are clear suffer mild scarring or atelectasis. No pneumothorax or hemothorax. Hepatobiliary: Normal.  No traumatic finding. Pancreas: Normal Spleen: Normal Adrenals/Urinary Tract: Adrenal glands are normal. Some atrophic changes of the kidneys with small cysts. No hydronephrosis. No renal injury. Stomach/Bowel: No primary bowel pathology. Large ventral hernia with the hernia defect measuring up to 10 cm in diameter, containing mesenteric fat and nonobstructed small and large bowel. Previous resection in the rectosigmoid junction region. Vascular/Lymphatic: Aortic atherosclerosis. No aneurysm.  IVC is normal. No retroperitoneal hematoma. Reproductive: Previous hysterectomy.  No pelvic mass. Other: No free fluid or air. Musculoskeletal: Fracture the right ninth, tenth and eleventh posterior ribs without displacement. Most extensive fracture rib 11 with comminution. IMPRESSION: Right ninth, tenth and eleventh rib fractures posteriorly without displacement. No retroperitoneal bleeding. No evidence of organ injury. No pneumothorax or hemothorax. Aortic atherosclerosis. Electronically Signed   By: Nelson Chimes M.D.   On: 06/18/2017 15:00    EKG  EKG Interpretation None       Radiology Dg Ribs Unilateral W/chest Right  Result Date: 06/18/2017 CLINICAL DATA:  Fall on bathtub.  Rib pain. COMPARISON:  12/11/2016 and 12/06/2016. FINDINGS: Left chest wall ICD is noted with lead in the right ventricle. Normal heart size. No pleural effusions or edema. No airspace opacities. No displaced rib fractures identified. IMPRESSION: No rib fractures visualized. Electronically Signed   By: Kerby Moors M.D.   On: 06/18/2017 13:41   Ct Abdomen Pelvis W Contrast  Result Date: 06/18/2017 CLINICAL DATA:  Slipped in the shower, striking the right side with severe pain  with cough and movement. EXAM: CT ABDOMEN AND PELVIS WITH CONTRAST TECHNIQUE: Multidetector CT imaging of the abdomen and pelvis was performed using the standard protocol following bolus administration of intravenous contrast. CONTRAST:  178mL ISOVUE-300 IOPAMIDOL (ISOVUE-300) INJECTION 61% COMPARISON:  Chest radiography same day. FINDINGS: Lower chest: Lung bases are clear suffer mild scarring or atelectasis. No pneumothorax or hemothorax. Hepatobiliary: Normal.  No traumatic finding. Pancreas: Normal Spleen: Normal Adrenals/Urinary Tract: Adrenal glands are normal. Some atrophic changes of the kidneys with small cysts. No hydronephrosis. No renal injury. Stomach/Bowel: No primary bowel pathology. Large ventral hernia with the hernia defect measuring up to 10 cm in diameter, containing mesenteric fat and nonobstructed small and large bowel. Previous resection in the rectosigmoid junction region. Vascular/Lymphatic: Aortic atherosclerosis. No aneurysm. IVC is normal. No retroperitoneal hematoma. Reproductive: Previous hysterectomy.  No pelvic mass. Other: No free fluid or air. Musculoskeletal: Fracture the right ninth, tenth and eleventh posterior ribs without displacement. Most extensive fracture rib 11 with comminution. IMPRESSION: Right ninth, tenth and eleventh rib fractures posteriorly without displacement. No retroperitoneal bleeding. No evidence of organ injury. No pneumothorax or hemothorax. Aortic atherosclerosis. Electronically Signed   By: Nelson Chimes M.D.   On: 06/18/2017 15:00    Procedures Procedures (including critical care time)  Medications Ordered in ED Medications - No data to display   Initial Impression / Assessment and Plan / ED Course  I have reviewed the triage vital signs and the nursing notes.  Pertinent labs & imaging results that were available during my care of the patient were reviewed by me and considered in my medical decision making (see chart for details).  Iv ns.  Imaging studies ordered.  Reviewed nursing notes and prior charts for additional history.   Right sided rib fxs - discussed w pt.  Pt is ambulatory to bathroom.   Recheck breathing comfortably. Pulse ox 99%.    Incentive spirometer for home.  Noting many pain med allergies, I discussed w pt - she indicates she has taken oxycodone with good results and no adverse rxn - she states she usually takes 1/2 tablet.   Oxycodone po for pain.  Patient currently appears stable for d/c.     Final Clinical Impressions(s) / ED Diagnoses   Final diagnoses:  None    ED Discharge Orders    None       Lajean Saver, MD 06/18/17  1515  

## 2017-06-18 NOTE — ED Triage Notes (Signed)
Pt arrived from home via EMS . EMS reports that pt had a slip and fall in the shower this morning. Pt denies LOC, no Blood thinners. Pt is c/o rt sided ribs and flank pain 10/10 pain and worsens with movement. Pt is alert and oriented x 4 and is verbally responsive.     EMS v/s 118/46, hr 76 RR 16 (4% RA   Enroute received 150 mcg of Fentanyl  20g  Rt hand.

## 2017-06-19 ENCOUNTER — Encounter: Payer: Self-pay | Admitting: Cardiovascular Disease

## 2017-06-19 ENCOUNTER — Ambulatory Visit (INDEPENDENT_AMBULATORY_CARE_PROVIDER_SITE_OTHER): Payer: Medicare Other | Admitting: Cardiovascular Disease

## 2017-06-19 VITALS — BP 111/75 | HR 89 | Ht 65.0 in | Wt 180.0 lb

## 2017-06-19 DIAGNOSIS — I420 Dilated cardiomyopathy: Secondary | ICD-10-CM | POA: Diagnosis not present

## 2017-06-19 DIAGNOSIS — I951 Orthostatic hypotension: Secondary | ICD-10-CM | POA: Diagnosis not present

## 2017-06-19 DIAGNOSIS — F411 Generalized anxiety disorder: Secondary | ICD-10-CM | POA: Diagnosis not present

## 2017-06-19 DIAGNOSIS — S2241XD Multiple fractures of ribs, right side, subsequent encounter for fracture with routine healing: Secondary | ICD-10-CM

## 2017-06-19 DIAGNOSIS — R5381 Other malaise: Secondary | ICD-10-CM | POA: Diagnosis not present

## 2017-06-19 DIAGNOSIS — I5022 Chronic systolic (congestive) heart failure: Secondary | ICD-10-CM | POA: Diagnosis not present

## 2017-06-19 DIAGNOSIS — I10 Essential (primary) hypertension: Secondary | ICD-10-CM | POA: Diagnosis not present

## 2017-06-19 DIAGNOSIS — M797 Fibromyalgia: Secondary | ICD-10-CM

## 2017-06-19 DIAGNOSIS — Z95810 Presence of automatic (implantable) cardiac defibrillator: Secondary | ICD-10-CM

## 2017-06-19 DIAGNOSIS — I428 Other cardiomyopathies: Secondary | ICD-10-CM | POA: Diagnosis not present

## 2017-06-19 DIAGNOSIS — S2249XA Multiple fractures of ribs, unspecified side, initial encounter for closed fracture: Secondary | ICD-10-CM | POA: Insufficient documentation

## 2017-06-19 DIAGNOSIS — R5382 Chronic fatigue, unspecified: Secondary | ICD-10-CM

## 2017-06-19 NOTE — Patient Instructions (Signed)

## 2017-06-22 ENCOUNTER — Encounter: Payer: Self-pay | Admitting: Family Medicine

## 2017-06-22 ENCOUNTER — Ambulatory Visit (INDEPENDENT_AMBULATORY_CARE_PROVIDER_SITE_OTHER): Payer: Medicare Other | Admitting: Family Medicine

## 2017-06-22 VITALS — BP 118/64 | HR 84 | Temp 98.1°F

## 2017-06-22 DIAGNOSIS — K5903 Drug induced constipation: Secondary | ICD-10-CM

## 2017-06-22 DIAGNOSIS — N183 Chronic kidney disease, stage 3 unspecified: Secondary | ICD-10-CM

## 2017-06-22 DIAGNOSIS — D803 Selective deficiency of immunoglobulin G [IgG] subclasses: Secondary | ICD-10-CM | POA: Diagnosis not present

## 2017-06-22 DIAGNOSIS — Y92009 Unspecified place in unspecified non-institutional (private) residence as the place of occurrence of the external cause: Secondary | ICD-10-CM

## 2017-06-22 DIAGNOSIS — W19XXXA Unspecified fall, initial encounter: Secondary | ICD-10-CM

## 2017-06-22 DIAGNOSIS — S2241XA Multiple fractures of ribs, right side, initial encounter for closed fracture: Secondary | ICD-10-CM

## 2017-06-22 MED ORDER — OXYCODONE HCL 5 MG PO TABS: 5.0000 mg | ORAL_TABLET | ORAL | 0 refills | Status: DC | PRN

## 2017-06-22 MED ORDER — NYSTATIN 100000 UNIT/GM EX CREA: 1.0000 "application " | TOPICAL_CREAM | Freq: Two times a day (BID) | CUTANEOUS | 1 refills | Status: DC

## 2017-06-22 NOTE — Patient Instructions (Addendum)
Start tylenol 500mg  three times daily with meals.  May take oxycodone 1 tablets every 4 hours as needed for the next week. May take 1.5 tablets if severe pain. Call me on Wednesday with an update.  Increase water intake over the next week.  We will avoid muscle relaxant.

## 2017-06-22 NOTE — Progress Notes (Addendum)
BP 118/64 (BP Location: Right Arm, Patient Position: Sitting, Cuff Size: Normal)   Pulse 84   Temp 98.1 F (36.7 C) (Oral)   SpO2 94%    CC: ER f/u visit Subjective:    Patient ID: Ashley [name], female    DOB: 15-Oct-1945, 72 y.o.   MRN: [id number]  HPI: Ashley [name] is a 72 y.o. female presenting on 06/22/2017 for Follow-up (Seen at [facility] ED on 06/18/17 due to a fall. Has 4 rib fxs. Left side ribs are warm to to the touch. Wants left leg checked.  Accompanied by husband, Herbie [place].)   Recent ER visit 2/4 after fall at home - note reviewed. Slipped in bathtub and fractured 9th 10th and 11th R posterior ribs on CT scan (11th rib comminuted). Treating with oxycodone whole tablet 5-7.5mg  Q4 hours, but with breakthrough pain despite this. Has 1 more tablet. Needs pain Rx refill. Given home IS to use - is using. Increasing constipation with narcotic. Eating prunes, taking 1/2 capful twice daily miralax, taking colace 100mg  twice daily.  No premonitory symptoms.   Has not done well with muscle relaxants in the past.  Avoids NSAIDs due to CKD.   Relevant past medical, surgical, family and social history reviewed and updated as indicated. Interim medical history since our last visit reviewed. Allergies and medications reviewed and updated. Outpatient Medications Prior to Visit  Medication Sig Dispense Refill  . acetaminophen (TYLENOL) 500 MG tablet Take 500 mg by mouth daily as needed (PAIN).    Marland Kitchen albuterol (PROVENTIL HFA;VENTOLIN HFA) 108 (90 Base) MCG/ACT inhaler Inhale 2 puffs into the lungs every 6 (six) hours as needed for wheezing or shortness of breath. 1 Inhaler 0  . alendronate (FOSAMAX) 70 MG tablet Take 70 mg by mouth once a week. Patient takes every Thursday    . AMBULATORY NON FORMULARY MEDICATION Medication Name:  1 squatty potty to use as needed 1 each 0  . Ascorbic Acid (VITAMIN C) 1000 MG tablet Take 1,000 mg by mouth daily.    Marland Kitchen aspirin EC 81 MG tablet Take 81 mg by mouth  daily.    Marland Kitchen BIOTIN PO Take 1 tablet by mouth daily.    . Calcium Carbonate-Vitamin D (CALCIUM + D PO) Take 1 tablet by mouth daily.    . carvedilol (COREG) 6.25 MG tablet TAKE 1 TABLET (6.25 MG TOTAL) BY MOUTH 2 (TWO) TIMES DAILY WITH A MEAL. 180 tablet 3  . cholecalciferol (VITAMIN D) 1000 units tablet Take 2,000 Units by mouth daily.    . Coenzyme Q10 (CO Q 10 PO) Take 1 tablet by mouth daily.    Marland Kitchen dextromethorphan (DELSYM) 30 MG/5ML liquid Take 15 mg by mouth at bedtime as needed for cough.    . ezetimibe (ZETIA) 10 MG tablet TAKE 1 TABLET (10 MG TOTAL) BY MOUTH DAILY. 30 tablet 5  . GARLIC PO Take 1 tablet by mouth daily.    . magic mouthwash SOLN Take 5 mLs 3 (three) times daily as needed by mouth for mouth pain. 120 mL 0  . omeprazole (PRILOSEC) 40 MG capsule Take 1 capsule (40 mg total) by mouth daily.    . potassium chloride (K-DUR) 10 MEQ tablet Take 10 mEq by mouth as directed. Take daily as needed    . Pyridoxine HCl (VITAMIN B6 PO) Take 1 capsule by mouth daily. Reported on 07/12/2015    . Red Yeast Rice 600 MG CAPS Take 1 capsule (600 mg total) by mouth 2 (two)  times daily.    Marland Kitchen spironolactone (ALDACTONE) 25 MG tablet TAKE 1 TABLET BY MOUTH DAILY 90 tablet 3  . TOLAK 4 % CREA Take 1 application by mouth. EVERYDAY FOR 2 TO 3 WEEKS TO LESION ON BACK  0  . torsemide (DEMADEX) 10 MG tablet Take 10 mg by mouth daily as needed (swelling/fluid).    Marland Kitchen umeclidinium-vilanterol (ANORO ELLIPTA) 62.5-25 MCG/INH AEPB Inhale 1 puff into the lungs daily. 60 each 5  . vitamin E 100 UNIT capsule Take 100 Units by mouth daily.    Marland Kitchen nystatin (MYCOSTATIN/NYSTOP) powder Apply topically 2 (two) times daily as needed. 30 g 0  . oxyCODONE (ROXICODONE) 5 MG immediate release tablet Take 1 tablet (5 mg total) by mouth every 4 (four) hours as needed for severe pain. 20 tablet 0  . losartan (COZAAR) 50 MG tablet Take 1 tablet (50 mg total) by mouth daily. (Patient not taking: Reported on 06/18/2017) 30 tablet 6    No facility-administered medications prior to visit.      Per HPI unless specifically indicated in ROS section below Review of Systems     Objective:    BP 118/64 (BP Location: Right Arm, Patient Position: Sitting, Cuff Size: Normal)   Pulse 84   Temp 98.1 F (36.7 C) (Oral)   SpO2 94%   Wt Readings from Last 3 Encounters:  06/19/17 180 lb (81.6 kg)  06/18/17 179 lb (81.2 kg)  03/27/17 178 lb (80.7 kg)    Physical Exam  Constitutional: She appears well-developed and well-nourished. No distress.  HENT:  Mouth/Throat: Oropharynx is clear and moist. No oropharyngeal exudate.  Cardiovascular: Normal rate, regular rhythm, normal heart sounds and intact distal pulses.  No murmur heard. Pulmonary/Chest: Effort normal and breath sounds normal. No respiratory distress. She has no wheezes. She has no rales. She exhibits tenderness.  Point tender to palpation right posterior lower ribcage with some interspersed bruising  Musculoskeletal: She exhibits no edema.  Skin: Skin is warm and dry. Bruising (interspersed R back and abdomen) and rash noted. No erythema.     Faint papular rash L lateral leg  Nursing note and vitals reviewed.  Results for orders placed or performed during the hospital encounter of 06/18/17  CBC  Result Value Ref Range   WBC 13.4 (H) 4.0 - 10.5 K/uL   RBC 4.27 3.87 - 5.11 MIL/uL   Hemoglobin 12.8 12.0 - 15.0 g/dL   HCT 37.6 36.0 - 46.0 %   MCV 88.1 78.0 - 100.0 fL   MCH 30.0 26.0 - 34.0 pg   MCHC 34.0 30.0 - 36.0 g/dL   RDW 13.2 11.5 - 15.5 %   Platelets 285 150 - 400 K/uL  Basic metabolic panel  Result Value Ref Range   Sodium 142 135 - 145 mmol/L   Potassium 4.6 3.5 - 5.1 mmol/L   Chloride 108 101 - 111 mmol/L   CO2 26 22 - 32 mmol/L   Glucose, Bld 104 (H) 65 - 99 mg/dL   BUN 23 (H) 6 - 20 mg/dL   Creatinine, Ser 0.89 0.44 - 1.00 mg/dL   Calcium 10.1 8.9 - 10.3 mg/dL   GFR calc non Af Amer >60 >60 mL/min   GFR calc Af Amer >60 >60 mL/min    Anion gap 8 5 - 15      Assessment & Plan:   Problem List Items Addressed This Visit    CKD (chronic kidney disease) stage 3, GFR 30-59 ml/min (HCC)    Latest  Cr was improved      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       Multiple rib fractures - Primary    R posterior 9th 10th and comminuted 11th rib fxs suffered after mechanical fall (slipped in tub). No premonitory symptoms. Reviewed pain regimen. Ongoing pain. Will continue oxycodone 5mg  Q4 hours PRN for next week, #42 tablets provided today for acute pain management. [place] CSRS reviewed and appropriate. I did recommend she start tylenol 500mg  TID scheduled with meals.  Reviewed narcotic induced constipation and bowel regimen of miralax 1/2 capful BID, colace, prunes. Update if persistent or worsening constipation to intensify regimen.  They will call me mid next week with update on pain control and decide on ongoing narcotic regimen at that time.  Pt and husband agree with plan.        Other Visit Diagnoses    Fall at home, initial encounter       Drug induced constipation           Follow up plan: Return if symptoms worsen or fail to improve.  [name], MD

## 2017-06-23 ENCOUNTER — Encounter: Payer: Self-pay | Admitting: Family Medicine

## 2017-06-23 NOTE — Assessment & Plan Note (Signed)
Latest Cr was improved

## 2017-06-23 NOTE — Assessment & Plan Note (Signed)
Upcoming appt with [facility] this month.

## 2017-06-23 NOTE — Assessment & Plan Note (Signed)
R posterior 9th 10th and comminuted 11th rib fxs suffered after mechanical fall (slipped in tub). No premonitory symptoms. Reviewed pain regimen. Ongoing pain. Will continue oxycodone 5mg  Q4 hours PRN for next week, #42 tablets provided today for acute pain management. [place] CSRS reviewed and appropriate. I did recommend she start tylenol 500mg  TID scheduled with meals.  Reviewed narcotic induced constipation and bowel regimen of miralax 1/2 capful BID, colace, prunes. Update if persistent or worsening constipation to intensify regimen.  They will call me mid next week with update on pain control and decide on ongoing narcotic regimen at that time.  Pt and husband agree with plan.

## 2017-06-25 ENCOUNTER — Telehealth: Payer: Self-pay

## 2017-06-25 ENCOUNTER — Telehealth: Payer: Self-pay | Admitting: Family Medicine

## 2017-06-25 NOTE — Telephone Encounter (Signed)
Copied from [place]. Topic: General - Other >> Jun 25, 2017  9:46 AM Darl Householder, RMA wrote: Reason for CRM: Patient os requesting a call back from Dr. Danise Mina CMA concerning office visit on Friday and medication

## 2017-06-25 NOTE — Telephone Encounter (Signed)
Left message on vm per dpr notifying pt her message has been forwarded to Dr. Darnell Level for the request to change pain med from oxycodone to something that does not cause constipation.  Informed her she will be contacted with his recommendation.

## 2017-06-25 NOTE — Telephone Encounter (Signed)
Unable to reach pt by phone to see if she went to UC.

## 2017-06-25 NOTE — Telephone Encounter (Signed)
PLEASE NOTE: All timestamps contained within this report are represented as [place] Standard Time. CONFIDENTIALTY NOTICE: This fax transmission is intended only for the addressee. It contains information that is legally privileged, confidential or otherwise protected from use or disclosure. If you are not the intended recipient, you are strictly prohibited from reviewing, disclosing, copying using or disseminating any of this information or taking any action in reliance on or regarding this information. If you have received this fax in error, please notify us immediately by telephone so that we can arrange for its return to [place]. Phone: [phone], Toll-Free: [phone], Fax: [phone] Page: 1 of 2 Call Id: [id number] [place] Patient Name: Ashley [name] Gender: Female DOB: 12-12-1945 Age: 72 Y 11 M 3 D Return Phone Number: [phone] (Primary) [place] [address] Client [facility] Night - Client Client Site [place] Physician [name] - MD Contact Type Call Who Is Calling Patient / Member / Family / Caregiver Call Type Triage / Clinical Relationship To Patient Self Return Phone Number [phone] (Primary) Chief Complaint Constipation Reason for Call Symptomatic / Request for [place] states was seen yesterday; 4 ribs fractured last monday; on oxycodone since; he prescribed another week of oxycodone; also adv him constipated since Monday; last night d/t pain feels like cracked another rib; wants pain meds that don't contipate and something to help her go; using miralax; 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 If symptomatic, describe symptoms. 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 Does the patient have any new or worsening symptoms? ---Yes Will a triage be completed? ---Yes Related visit to physician within the last 2 weeks? ---Yes Does the PT have any chronic conditions? (i.e. diabetes, asthma, etc.) ---Yes List chronic conditions. ---Pacemaker/ defibrillator Is this a behavioral health or substance abuse call? ---No PLEASE NOTE: All timestamps contained within this report are represented as [place] Standard Time. CONFIDENTIALTY NOTICE: This fax transmission is intended only for the addressee. It contains information that is legally privileged, confidential or otherwise protected from use or disclosure. If you are not the intended recipient, you are strictly prohibited from reviewing, disclosing, copying using or disseminating any of this information or taking any action in reliance on or regarding this information. If you have received this fax in error, please notify us immediately by telephone so that we can arrange for its return to [place]. Phone: [phone], Toll-Free: [phone], Fax: [phone] Page: 2 of 2 Call Id: [id number] Guidelines Guideline Title Affirmed Question Affirmed Notes Nurse Date/Time [name] Time) Constipation [1] Constant abdominal pain AND [2] present > 2 hours Terrence Dupont 06/23/2017 9:05:49 AM Disp. Time [name] Time) Disposition Final User 06/23/2017 9:11:25 AM See Physician within 4 Hours (or PCP triage) Yes Dimas Chyle, RN,  Gu[place] Minors Disagree/Comply Comply Caller Understands Yes PreDisposition Did not know what to do Care  Advice Given Per Guideline SEE PHYSICIAN WITHIN 4 HOURS (or PCP triage): * IF OFFICE WILL BE CLOSED AND NO PCP TRIAGE: You need to be seen within the next 3 or 4 hours. A nearby Urgent Care Center is often a good source of care. Another choice is to go to the ER. Go sooner if you become worse. NOTHING BY MOUTH: Do not eat or drink anything for now. CALL BACK IF: * You become worse. CARE ADVICE given per Constipation (Adult) guideline. Referrals GO TO FACILITY UNDECIDED

## 2017-06-25 NOTE — Telephone Encounter (Signed)
I returned pt call; unable to reach pt by phone; see phone note 06/25/17. There is also a team health note.

## 2017-06-25 NOTE — Telephone Encounter (Signed)
Spoke to patient and was advised that she saw Dr.Gutierrez Friday because of a fall that she had last Monday. Patient stated that she has been having extreme constipation from the the Oxycodone. Patient stated that she had to take Colace and Mirallax and was finally was able to have a BM Saturday night. Patient stated prior to that she was having to remove the fecal material by picking it out. Patient stated that she has stopped taking the Oxycodone because of the constipation. Patient stated that she has been taking Tylenol 750 mg every 4 hours for the pain. Patient wants to know how long she can take this amount of Tylenol without it causing her problems?

## 2017-06-26 NOTE — Telephone Encounter (Signed)
Has she tried tramadol before? Unfortunately no other good pain options for her. Agree with stopping oxycodone.  She can take tylenol 750mg  every 6 hours or 1000mg  every 8 hours but don't recommend more than that. Should be ok to take this dose without long term trouble.

## 2017-06-26 NOTE — Telephone Encounter (Signed)
Spoke with pt asking if she has tried tramadol.  Thinks she was given tramadol after having colon surgery but does not think it helped.  And right now she does not want to start it. I relayed message and instructions per Dr. Darnell Level.  Pt states she has been taking 750 mg of Tylenol and BMs are a lot softer.  However, last night pain was bad so pt took 1/2 tab of oxycodone then 4 hours later took the other half, then 4 hours later took the Tylenol.  Pt is asking, if her BMs are normal, is it ok for her to continue to alternate 1/2 tab of oxycodone and the Tylenol.

## 2017-06-26 NOTE — Telephone Encounter (Signed)
Left message on vm per dpr relaying message per Dr. G.  

## 2017-06-26 NOTE — Telephone Encounter (Signed)
Yes - I think that should work well.

## 2017-06-27 DIAGNOSIS — K529 Noninfective gastroenteritis and colitis, unspecified: Secondary | ICD-10-CM | POA: Diagnosis not present

## 2017-06-27 DIAGNOSIS — K582 Mixed irritable bowel syndrome: Secondary | ICD-10-CM | POA: Diagnosis not present

## 2017-06-27 DIAGNOSIS — K219 Gastro-esophageal reflux disease without esophagitis: Secondary | ICD-10-CM | POA: Diagnosis not present

## 2017-06-27 DIAGNOSIS — D801 Nonfamilial hypogammaglobulinemia: Secondary | ICD-10-CM | POA: Diagnosis not present

## 2017-06-28 NOTE — Telephone Encounter (Signed)
Spoke with pt relaying message per Dr. G.  Pt says ok. 

## 2017-06-29 ENCOUNTER — Telehealth: Payer: Self-pay | Admitting: Family Medicine

## 2017-06-29 ENCOUNTER — Encounter: Payer: Self-pay | Admitting: *Deleted

## 2017-06-29 MED ORDER — BENZONATATE 100 MG PO CAPS: 100.0000 mg | ORAL_CAPSULE | Freq: Two times a day (BID) | ORAL | 0 refills | Status: DC | PRN

## 2017-06-29 NOTE — Telephone Encounter (Signed)
She may try tessalon perls as needed for cough -swallow, don't chew. Hopefully this will help suppress cough. Would also suggest OTC delsym or dimetapp for cough suppression.

## 2017-06-29 NOTE — Telephone Encounter (Signed)
This encounter was created in error - please disregard.

## 2017-06-29 NOTE — Telephone Encounter (Signed)
Left message on vm per dpr relaying message per Dr. G.  

## 2017-06-29 NOTE — Telephone Encounter (Signed)
Copied from [place] [id number]. Topic: Quick Communication - See Telephone Encounter >> Jun 29, 2017 10:50 AM Percell Belt A wrote: CRM for notification. See Telephone encounter for: pt called in and now says that she she feels like she is getting a small cough / cold and is sneezing .  She is requesting that Dr Darnell Level  CMA called her because she does not want to come in with her factored ribs .  She does not want it to get worse with her having the cracked ribs   Best number [phone]  06/29/17.

## 2017-06-29 NOTE — Telephone Encounter (Signed)
Patient called and she is going to try the Rx for cough. She is afraid she may be trying to come down with Bronchitis and she is very nervious about that with her broken ribs.  Patient states she will call for an appointment if she feels she needs to be seen. She is still dealing with her pain management and her fear of constipation. Discussed use of stool softeners and increased fluid intake.

## 2017-07-06 DIAGNOSIS — R768 Other specified abnormal immunological findings in serum: Secondary | ICD-10-CM | POA: Diagnosis not present

## 2017-07-06 DIAGNOSIS — M25551 Pain in right hip: Secondary | ICD-10-CM | POA: Diagnosis not present

## 2017-07-06 DIAGNOSIS — M25552 Pain in left hip: Secondary | ICD-10-CM | POA: Diagnosis not present

## 2017-07-08 ENCOUNTER — Other Ambulatory Visit: Payer: Self-pay | Admitting: Nurse Practitioner

## 2017-07-13 DIAGNOSIS — L570 Actinic keratosis: Secondary | ICD-10-CM | POA: Diagnosis not present

## 2017-07-13 DIAGNOSIS — L821 Other seborrheic keratosis: Secondary | ICD-10-CM | POA: Diagnosis not present

## 2017-07-13 DIAGNOSIS — L720 Epidermal cyst: Secondary | ICD-10-CM | POA: Diagnosis not present

## 2017-07-17 ENCOUNTER — Ambulatory Visit: Payer: Self-pay | Admitting: *Deleted

## 2017-07-17 NOTE — Telephone Encounter (Signed)
How is ribcage pain after fractures? She is taking tramadol for this.  Is she noticing increasing unsteadiness leading to increasing falls, or was this just a mechanical fall ie tangling over bedspread? If more unsteady, lightheaded to let me know - may need at minimum to use cane to prevent fall.

## 2017-07-17 NOTE — Telephone Encounter (Signed)
Spoke with asking about rib pain.  States it is a thousand times better. Says she can do more.  Denies increased unsteadiness.  Says she just go tangled in the bedspread.

## 2017-07-17 NOTE — Telephone Encounter (Signed)
Pt called to report a fall last night. States "Got tangled up in the bed spread and fell into closet, then to the floor."  Denies LOC. States she hit her head on the closet door and has a small bump in the back of her head. "Tiny" skin tear on right hand. Left hip "twinges at times,not painful." Ambulating without difficulty. Denies dizziness prior to fall.  Reports headache for 2-3 hours after fall 3-4/10; none presently. Also reports mild "soreness" right side below ribs that were fractured 06/18/17 s/p fall.  Denies any SOB, no bruising, no dizziness. States she "just wants Dr. Danise Mina to know about it. He keeps up with me." Home Care advise given per protocol. Instructed to call back if SOB, increased rib pain, headache. Note routed  to practice.   Reason for Disposition . Scalp swelling, bruise or pain  Answer Assessment - Initial Assessment Questions 1. MECHANISM: "How did the injury happen?" For falls, ask: "What height did you fall from?" and "What surface did you fall against?"      "Got tangled up in the bed spread and fell against closet door, then to floor." 2. ONSET: "When did the injury happen?" (Minutes or hours ago)      Last night 3. NEUROLOGIC SYMPTOMS: "Was there any loss of consciousness?" "Are there any other neurological symptoms?"      No 4. MENTAL STATUS: "Does the person know who he is, who you are, and where he is?"      yes 5. LOCATION: "What part of the head was hit?"      Back 6. SCALP APPEARANCE: "What does the scalp look like? Is it bleeding now?" If so, ask: "Is it difficult to stop?"     "Little bump" no open areas, lacerations.  7. SIZE: For cuts, bruises, or swelling, ask: "How large is it?" (e.g., inches or centimeters)      Small skin tear right hand "tiny" 8. PAIN: "Is there any pain?" If so, ask: "How bad is it?"  (e.g., Scale 1-10; or mild, moderate, severe)     Headache last night 3-4/10. Not presently; left hip "twinges at times." 9. TETANUS: For any  breaks in the skin, ask: "When was the last tetanus booster?"      10. OTHER SYMPTOMS: "Do you have any other symptoms?" (e.g., neck pain, vomiting)       Hit bed rail left side,"below where my ribs were fractured last month." No increased pain, no SOB.  Protocols used: HEAD INJURY-A-AH

## 2017-07-19 DIAGNOSIS — L570 Actinic keratosis: Secondary | ICD-10-CM | POA: Diagnosis not present

## 2017-07-23 ENCOUNTER — Ambulatory Visit (INDEPENDENT_AMBULATORY_CARE_PROVIDER_SITE_OTHER): Payer: Medicare Other | Admitting: Family Medicine

## 2017-07-23 ENCOUNTER — Ambulatory Visit (INDEPENDENT_AMBULATORY_CARE_PROVIDER_SITE_OTHER)
Admission: RE | Admit: 2017-07-23 | Discharge: 2017-07-23 | Disposition: A | Discharge status: Home / Self Care | Payer: Medicare Other | Source: Ambulatory Visit | Attending: Family Medicine | Admitting: Family Medicine

## 2017-07-23 ENCOUNTER — Encounter: Payer: Self-pay | Admitting: Family Medicine

## 2017-07-23 VITALS — BP 118/78 | HR 93 | Temp 97.8°F | Wt 183.0 lb

## 2017-07-23 DIAGNOSIS — S2231XA Fracture of one rib, right side, initial encounter for closed fracture: Secondary | ICD-10-CM | POA: Diagnosis not present

## 2017-07-23 DIAGNOSIS — R2681 Unsteadiness on feet: Secondary | ICD-10-CM

## 2017-07-23 DIAGNOSIS — R0781 Pleurodynia: Secondary | ICD-10-CM

## 2017-07-23 DIAGNOSIS — S2241XD Multiple fractures of ribs, right side, subsequent encounter for fracture with routine healing: Secondary | ICD-10-CM

## 2017-07-23 DIAGNOSIS — T1490XD Injury, unspecified, subsequent encounter: Secondary | ICD-10-CM | POA: Diagnosis not present

## 2017-07-23 MED ORDER — FIRST-DUKES MOUTHWASH MT SUSP: 5.0000 mL | Freq: Three times a day (TID) | OROMUCOSAL | 0 refills | Status: DC

## 2017-07-23 NOTE — Assessment & Plan Note (Signed)
After recent recurrent fall - tripped over bed sheets - has since changed bedding. Anticipate rib bruise - update R rib xrays r/o new fracture or other injury.

## 2017-07-23 NOTE — Assessment & Plan Note (Addendum)
Healing well. Update xray given latest fall with resultant lower ribcage pain. Anticipate more rib bruise. Reviewed current pain regimen.

## 2017-07-23 NOTE — Progress Notes (Signed)
BP 118/78 (BP Location: Left Arm, Patient Position: Sitting, Cuff Size: Normal)   Pulse 93   Temp 97.8 F (36.6 C) (Oral)   Wt 183 lb (83 kg)   SpO2 95%   BMI 30.45 kg/m    CC: fall Subjective:    Patient ID: Ashley [name], female    DOB: [birth year], 72 y.o.   MRN: [id number]  HPI: Ashley [name] is a 72 y.o. female presenting on 07/23/2017 for Fall [place] 07/16/17 and injured right side.  Not sure if she is bruised. Taking a deep breath causes coughing. Says it feels like the ribs are twisting. Not able to sleep well. Says ribs are healing well from first fall. Says she does not feel stable on her feet. Always has feeling of falling. ) and Form Completion (Needs Disability Parking Placard form completed.)   See prior note for details. Recent fall at home suffered 06/18/2017 s/p ER eval - found multiple posterior R rib fractures. Difficulty with pain control and tolerating pain medications - she is largely improved from this. Currently taking tylenol 750mg  PRN, taking Q4-6 hours.   Had another fall 07/16/2017 - got tangled in bedspread. However she does stay unsteady on her feet. Had another 5 near-falls in last few days. Some left leg weakness. Latest fall with some bruising under site of prior rib fractures. Ongoing pain at this site.   She did complete fall prevention and balance training program [facility]) - she thinks this was last year.   Had dermatological procedure on back for atypical mole with poorly healing wound - saw Dr Phillip Heal in [facility], most recently placed on clindamycin currently taking BID. She endorses several episodes of diarrhea over the weekend. She has picked scab off   Requests [facility] for ongoing throat pain worse with antibiotic.  Requests handicap placard filled out - often unable to walk past 200 ft without stopping to rest.   Relevant past medical, surgical, family and social history reviewed and updated as indicated. Interim medical history  since our last visit reviewed. Allergies and medications reviewed and updated. Outpatient Medications Prior to Visit  Medication Sig Dispense Refill  . acetaminophen (TYLENOL) 500 MG tablet Take 500 mg by mouth daily as needed (PAIN).    Marland Kitchen albuterol (PROVENTIL HFA;VENTOLIN HFA) 108 (90 Base) MCG/ACT inhaler Inhale 2 puffs into the lungs every 6 (six) hours as needed for wheezing or shortness of breath. 1 Inhaler 0  . alendronate (FOSAMAX) 70 MG tablet Take 70 mg by mouth once a week. Patient takes every Thursday    . AMBULATORY NON FORMULARY MEDICATION Medication Name:  1 squatty potty to use as needed 1 each 0  . Ascorbic Acid (VITAMIN C) 1000 MG tablet Take 1,000 mg by mouth daily.    Marland Kitchen aspirin EC 81 MG tablet Take 81 mg by mouth daily.    . benzonatate (TESSALON) 100 MG capsule Take 1 capsule (100 mg total) by mouth 2 (two) times daily as needed for cough. 40 capsule 0  . BIOTIN PO Take 1 tablet by mouth daily.    . Calcium Carbonate-Vitamin D (CALCIUM + D PO) Take 1 tablet by mouth daily.    . carvedilol (COREG) 6.25 MG tablet TAKE 1 TABLET (6.25 MG TOTAL) BY MOUTH 2 (TWO) TIMES DAILY WITH A MEAL. 180 tablet 3  . cholecalciferol (VITAMIN D) 1000 units tablet Take 2,000 Units by mouth daily.    . clindamycin (CLEOCIN) 150 MG capsule Take 1 capsule by  mouth 3 (three) times daily.  0  . Coenzyme Q10 (CO Q 10 PO) Take 1 tablet by mouth daily.    Marland Kitchen dextromethorphan (DELSYM) 30 MG/5ML liquid Take 15 mg by mouth at bedtime as needed for cough.    . ezetimibe (ZETIA) 10 MG tablet TAKE 1 TABLET (10 MG TOTAL) BY MOUTH DAILY. 30 tablet 5  . GARLIC PO Take 1 tablet by mouth daily.    Marland Kitchen losartan (COZAAR) 50 MG tablet TAKE 1 TABLET BY MOUTH EVERY DAY 30 tablet 6  . magic mouthwash SOLN Take 5 mLs 3 (three) times daily as needed by mouth for mouth pain. 120 mL 0  . mupirocin ointment (BACTROBAN) 2 % 1(ONE) APPLICATION(S) TOPICAL 2(TWO) TIMES A DAY  0  . nystatin cream (MYCOSTATIN) Apply 1 application  topically 2 (two) times daily. 60 g 1  . omeprazole (PRILOSEC) 40 MG capsule Take 1 capsule (40 mg total) by mouth daily.    Marland Kitchen oxyCODONE (ROXICODONE) 5 MG immediate release tablet Take 1 tablet (5 mg total) by mouth every 4 (four) hours as needed for severe pain. 42 tablet 0  . potassium chloride (K-DUR) 10 MEQ tablet Take 10 mEq by mouth as directed. Take daily as needed    . Pyridoxine HCl (VITAMIN B6 PO) Take 1 capsule by mouth daily. Reported on 07/12/2015    . Red Yeast Rice 600 MG CAPS Take 1 capsule (600 mg total) by mouth 2 (two) times daily.    Marland Kitchen spironolactone (ALDACTONE) 25 MG tablet TAKE 1 TABLET BY MOUTH DAILY 90 tablet 3  . TOLAK 4 % CREA Take 1 application by mouth. EVERYDAY FOR 2 TO 3 WEEKS TO LESION ON BACK  0  . torsemide (DEMADEX) 10 MG tablet Take 10 mg by mouth daily as needed (swelling/fluid).    . triamcinolone (KENALOG) 0.025 % ointment 1(ONE) APPLICATION(S) TOPICAL 2(TWO) TIMES A DAY  0  . umeclidinium-vilanterol (ANORO ELLIPTA) 62.5-25 MCG/INH AEPB Inhale 1 puff into the lungs daily. 60 each 5  . vitamin E 100 UNIT capsule Take 100 Units by mouth daily.     No facility-administered medications prior to visit.      Per HPI unless specifically indicated in ROS section below Review of Systems     Objective:    BP 118/78 (BP Location: Left Arm, Patient Position: Sitting, Cuff Size: Normal)   Pulse 93   Temp 97.8 F (36.6 C) (Oral)   Wt 183 lb (83 kg)   SpO2 95%   BMI 30.45 kg/m   Wt Readings from Last 3 Encounters:  07/23/17 183 lb (83 kg)  06/19/17 180 lb (81.6 kg)  06/18/17 179 lb (81.2 kg)    Physical Exam  Constitutional: She appears well-developed and well-nourished. No distress.  HENT:  Head: Normocephalic and atraumatic.  Mouth/Throat: Oropharynx is clear and moist. No oropharyngeal exudate.  Cardiovascular: Normal rate, regular rhythm, normal heart sounds and intact distal pulses.  No murmur heard. Pulmonary/Chest: Effort normal and breath sounds  normal. No respiratory distress. She has no wheezes. She has no rales. She exhibits tenderness (discomfort to palpation R posterior lower ribcage).  Musculoskeletal: She exhibits no edema.  Neurological: She is alert. She has normal strength. No sensory deficit. She displays a negative Romberg sign. Coordination and gait normal.  5/5 strength BLE No pronator drift  Skin: Skin is warm and dry. No rash noted.  Healing shallow ulcer posterior upper back without surrounding erythema  Psychiatric: She has a normal mood and affect.  Nursing note and vitals reviewed.  Results for orders placed or performed during the hospital encounter of 06/18/17  CBC  Result Value Ref Range   WBC 13.4 (H) 4.0 - 10.5 K/uL   RBC 4.27 3.87 - 5.11 MIL/uL   Hemoglobin 12.8 12.0 - 15.0 g/dL   HCT 37.6 36.0 - 46.0 %   MCV 88.1 78.0 - 100.0 fL   MCH 30.0 26.0 - 34.0 pg   MCHC 34.0 30.0 - 36.0 g/dL   RDW 13.2 11.5 - 15.5 %   Platelets 285 150 - 400 K/uL  Basic metabolic panel  Result Value Ref Range   Sodium 142 135 - 145 mmol/L   Potassium 4.6 3.5 - 5.1 mmol/L   Chloride 108 101 - 111 mmol/L   CO2 26 22 - 32 mmol/L   Glucose, Bld 104 (H) 65 - 99 mg/dL   BUN 23 (H) 6 - 20 mg/dL   Creatinine, Ser 0.89 0.44 - 1.00 mg/dL   Calcium 10.1 8.9 - 10.3 mg/dL   GFR calc non Af Amer >60 >60 mL/min   GFR calc Af Amer >60 >60 mL/min   Anion gap 8 5 - 15      Assessment & Plan:   Problem List Items Addressed This Visit    Healing wound    Back wound seems to be healing well without surrounding erythema - despite patient picking at it. Given pt's extensive GI history and recent diarrhea, recommended she discontinue clindamycin. If persistent diarrhea, pt will return for GI pathogen panel. In interim, recommended mupirocin cream to wound with daily bandage change. Will cc derm as fyi.       Multiple rib fractures    Healing well. Update xray given latest fall with resultant lower ribcage pain. Anticipate more rib  bruise. Reviewed current pain regimen.       Relevant Orders   Ambulatory referral to Physical Therapy   Rib pain on right side - Primary    After recent recurrent fall - tripped over bed sheets - has since changed bedding. Anticipate rib bruise - update R rib xrays r/o new fracture or other injury.       Relevant Orders   DG Ribs Unilateral Right   Ambulatory referral to Physical Therapy   Unsteadiness on feet    Ongoing endorsed by patient - she has had several falls and near falls in the last few months. Recommended outpatient fall prevention and balance training program - ordered today.       Relevant Orders   Ambulatory referral to Physical Therapy       Meds ordered this encounter  Medications  . Diphenhyd-Hydrocort-Nystatin (FIRST-D[facility]ES MOUTHWASH) SUSP    Sig: Use as directed 5 mLs in the mouth or throat 3 (three) times daily.    Dispense:  237 mL    Refill:  0   Orders Placed This Encounter  Procedures  . DG Ribs Unilateral Right    Standing Status:   Future    Number of Occurrences:   1    Standing Expiration Date:   09/23/2018    Order Specific Question:   Reason for Exam (SYMPTOM  OR DIAGNOSIS REQUIRED)    Answer:   right sided rib pain    Order Specific Question:   Preferred imaging location?    Answer:   [facility]    Order Specific Question:   Radiology Contrast Protocol - do NOT remove file path    Answer:   [email or web address]  .  Ambulatory referral to Physical Therapy    Referral Priority:   Routine    Referral Type:   Physical Medicine    Referral Reason:   Specialty Services Required    Requested Specialty:   Physical Therapy    Number of Visits Requested:   1    Follow up plan: No Follow-up on file.  [name], MD

## 2017-07-23 NOTE — Patient Instructions (Addendum)
May take tylenol 500mg  every 4 hours per day.  If diarrhea is worsening, or fevers, please return for stool test to evaluate for GI infection again.  R rib xrays today.  Stop clindamycin, start eating yogurt like activia.  Use mupirocin cream with bandaid dressing change daily.  We will refer you to outpatient balance training program.

## 2017-07-24 DIAGNOSIS — R2681 Unsteadiness on feet: Secondary | ICD-10-CM | POA: Insufficient documentation

## 2017-07-24 NOTE — Assessment & Plan Note (Addendum)
Back wound seems to be healing well without surrounding erythema - despite patient picking at it. Given pt's extensive GI history and recent diarrhea, recommended she discontinue clindamycin. If persistent diarrhea, pt will return for GI pathogen panel. In interim, recommended mupirocin cream to wound with daily bandage change. Will cc derm as fyi.

## 2017-07-24 NOTE — Assessment & Plan Note (Signed)
Ongoing endorsed by patient - she has had several falls and near falls in the last few months. Recommended outpatient fall prevention and balance training program - ordered today.

## 2017-07-27 DIAGNOSIS — N289 Disorder of kidney and ureter, unspecified: Secondary | ICD-10-CM | POA: Diagnosis not present

## 2017-07-27 DIAGNOSIS — C8350 Lymphoblastic (diffuse) lymphoma, unspecified site: Secondary | ICD-10-CM | POA: Diagnosis not present

## 2017-07-27 DIAGNOSIS — D801 Nonfamilial hypogammaglobulinemia: Secondary | ICD-10-CM | POA: Diagnosis not present

## 2017-07-27 DIAGNOSIS — R6889 Other general symptoms and signs: Secondary | ICD-10-CM | POA: Diagnosis not present

## 2017-08-01 DIAGNOSIS — R262 Difficulty in walking, not elsewhere classified: Secondary | ICD-10-CM | POA: Diagnosis not present

## 2017-08-01 DIAGNOSIS — R2681 Unsteadiness on feet: Secondary | ICD-10-CM | POA: Diagnosis not present

## 2017-08-03 DIAGNOSIS — L570 Actinic keratosis: Secondary | ICD-10-CM | POA: Diagnosis not present

## 2017-08-03 DIAGNOSIS — R2681 Unsteadiness on feet: Secondary | ICD-10-CM | POA: Diagnosis not present

## 2017-08-03 DIAGNOSIS — R262 Difficulty in walking, not elsewhere classified: Secondary | ICD-10-CM | POA: Diagnosis not present

## 2017-08-03 DIAGNOSIS — L298 Other pruritus: Secondary | ICD-10-CM | POA: Diagnosis not present

## 2017-08-06 DIAGNOSIS — R262 Difficulty in walking, not elsewhere classified: Secondary | ICD-10-CM | POA: Diagnosis not present

## 2017-08-06 DIAGNOSIS — R2681 Unsteadiness on feet: Secondary | ICD-10-CM | POA: Diagnosis not present

## 2017-08-07 DIAGNOSIS — R198 Other specified symptoms and signs involving the digestive system and abdomen: Secondary | ICD-10-CM | POA: Diagnosis not present

## 2017-08-07 DIAGNOSIS — R1084 Generalized abdominal pain: Secondary | ICD-10-CM | POA: Diagnosis not present

## 2017-08-07 DIAGNOSIS — K529 Noninfective gastroenteritis and colitis, unspecified: Secondary | ICD-10-CM | POA: Diagnosis not present

## 2017-08-08 ENCOUNTER — Telehealth: Payer: Self-pay

## 2017-08-08 DIAGNOSIS — N289 Disorder of kidney and ureter, unspecified: Secondary | ICD-10-CM

## 2017-08-08 DIAGNOSIS — R197 Diarrhea, unspecified: Secondary | ICD-10-CM

## 2017-08-08 DIAGNOSIS — N281 Cyst of kidney, acquired: Secondary | ICD-10-CM

## 2017-08-08 NOTE — Telephone Encounter (Addendum)
Spoke with pt relaying message per Dr. Darnell Level.  Pt is coming for labs tomorrow, 08/08/17 at 11:45 AM.

## 2017-08-08 NOTE — Telephone Encounter (Signed)
Thank you! 1. I recommend we start with renal US to further evaluate - I have ordered. May eventually need CT scan but want to start with [place] for less radiation (she's had a lot of CT scans). Looks like she's had stable benign renal cysts on imaging before. 2. Would have her come in for labs to start - ordered.  3. Thanks for sending records requested. Colonoscopy is likely best way to test for bacterial. I agree best for [facility] to have records of prior Colonoscopy so they're aware of prior findings.

## 2017-08-08 NOTE — Telephone Encounter (Signed)
Patient came by the office with following information: 1) wanting to make sure that you received results of her recent CT scans done at [facility] on 07/31/17 (the results are in care everywhere section). One of the CT results showed renal lesion and patient was told to follow up on this and would like [place] to follow up on this. Patient wanted to know how to proceed at this time about this. 2) she states she has had worse episodes of diarrhea the past 3 days and today it was very watery and was a yellow color, she is having pain in around incision site of her abdomen. Patient states she has long history of having these issues but wonders with change of her bowel habits if she is having another bowel infection like before. Patient states you are aware of her history. No fever. States when she had infection before her chronic symptoms and bowel movements were not any different. She would like to know if she needs to come in or what to do about this to evaluate if you think is needed? 3) Patient asked for previous records from [facility] and her colonoscopy/endoscopy report to be sent to [facility] (fax number is [phone]) to Manual Meier, [place] for them to review, I have done this and faxed it over already. That office is wanting to get patient in for Colonoscopy next month and she is nervous because last time she had this done the doctor said that is was difficult due to the linning of her colon and how thin it was and told patient not to have this done again if possible. She is wondering if she could just have a scan done to check for bacteria instead of colonoscopy. Patient states that is why they want to do Colonoscopy not to check for cancer but for other things like bacteria. She has been doing Cologuard testing but they told her that only checks for blood/cancer.-Anastasiya V Hopkins, RMA

## 2017-08-09 ENCOUNTER — Other Ambulatory Visit (INDEPENDENT_AMBULATORY_CARE_PROVIDER_SITE_OTHER): Payer: Medicare Other

## 2017-08-09 ENCOUNTER — Telehealth: Payer: Self-pay | Admitting: Cardiovascular Disease

## 2017-08-09 DIAGNOSIS — R197 Diarrhea, unspecified: Secondary | ICD-10-CM

## 2017-08-09 LAB — CBC WITH DIFFERENTIAL/PLATELET
Basophils Absolute: 0.1 10*3/uL (ref 0.0–0.1)
Basophils Relative: 1.3 % (ref 0.0–3.0)
Eosinophils Absolute: 0.1 10*3/uL (ref 0.0–0.7)
Eosinophils Relative: 1 % (ref 0.0–5.0)
HCT: 38.2 % (ref 36.0–46.0)
Hemoglobin: 12.5 g/dL (ref 12.0–15.0)
Lymphocytes Relative: 25 % (ref 12.0–46.0)
Lymphs Abs: 2.7 10*3/uL (ref 0.7–4.0)
MCHC: 32.7 g/dL (ref 30.0–36.0)
MCV: 90.8 fl (ref 78.0–100.0)
Monocytes Absolute: 1 10*3/uL (ref 0.1–1.0)
Monocytes Relative: 9.4 % (ref 3.0–12.0)
Neutro Abs: 6.8 10*3/uL (ref 1.4–7.7)
Neutrophils Relative %: 63.3 % (ref 43.0–77.0)
Platelets: 276 10*3/uL (ref 150.0–400.0)
RBC: 4.2 Mil/uL (ref 3.87–5.11)
RDW: 13.9 % (ref 11.5–15.5)
WBC: 10.7 10*3/uL — ABNORMAL HIGH (ref 4.0–10.5)

## 2017-08-09 LAB — COMPREHENSIVE METABOLIC PANEL
ALT: 32 U/L (ref 0–35)
AST: 21 U/L (ref 0–37)
Albumin: 4.3 g/dL (ref 3.5–5.2)
Alkaline Phosphatase: 102 U/L (ref 39–117)
BUN: 20 mg/dL (ref 6–23)
CO2: 30 mEq/L (ref 19–32)
Calcium: 10.2 mg/dL (ref 8.4–10.5)
Chloride: 100 mEq/L (ref 96–112)
Creatinine, Ser: 1.04 mg/dL (ref 0.40–1.20)
GFR: 55.35 mL/min — ABNORMAL LOW (ref >60.00)
Glucose, Bld: 123 mg/dL — ABNORMAL HIGH (ref 70–99)
Potassium: 4.4 mEq/L (ref 3.5–5.1)
Sodium: 138 mEq/L (ref 135–145)
Total Bilirubin: 0.3 mg/dL (ref 0.2–1.2)
Total Protein: 8.3 g/dL (ref 6.0–8.3)

## 2017-08-09 NOTE — Telephone Encounter (Signed)
° °  [facility] Pre-operative Risk Assessment    Request for surgical clearance:  1. What type of surgery is being performed? EGD    2. When is this surgery scheduled? 09/07/17   3. What type of clearance is required (medical clearance vs. Pharmacy clearance to hold med vs. Both)? Not checked   4. Are there any medications that need to be held prior to surgery and how long? Not checked off but listed   5. Practice name and name of physician performing surgery? [facility] office    6. What is your office phone and fax number?  Phone [phone] Fax      [phone]   7. Anesthesia type (None, local, MAC, general) ? Monitored    Placed in nurses bin

## 2017-08-09 NOTE — Telephone Encounter (Signed)
Noted. See referral message notes-Anastasiya Estell Harpin, RMA

## 2017-08-09 NOTE — Telephone Encounter (Signed)
Patient is on aspirin. Routing to Dr [name] for clearance.

## 2017-08-09 NOTE — Telephone Encounter (Signed)
Error

## 2017-08-10 ENCOUNTER — Ambulatory Visit
Admission: RE | Admit: 2017-08-10 | Discharge: 2017-08-10 | Disposition: A | Discharge status: Home / Self Care | Payer: Medicare Other | Source: Ambulatory Visit | Attending: Family Medicine | Admitting: Family Medicine

## 2017-08-10 DIAGNOSIS — N289 Disorder of kidney and ureter, unspecified: Secondary | ICD-10-CM | POA: Diagnosis not present

## 2017-08-10 DIAGNOSIS — N281 Cyst of kidney, acquired: Secondary | ICD-10-CM | POA: Insufficient documentation

## 2017-08-11 ENCOUNTER — Encounter: Payer: Self-pay | Admitting: Family Medicine

## 2017-08-11 DIAGNOSIS — N281 Cyst of kidney, acquired: Secondary | ICD-10-CM | POA: Insufficient documentation

## 2017-08-12 NOTE — Telephone Encounter (Signed)
Acceptable risk for egd No testing needed

## 2017-08-13 NOTE — Telephone Encounter (Signed)
Note routed to number listed.

## 2017-08-15 ENCOUNTER — Telehealth: Payer: Self-pay | Admitting: Family Medicine

## 2017-08-15 DIAGNOSIS — N281 Cyst of kidney, acquired: Secondary | ICD-10-CM

## 2017-08-15 NOTE — Telephone Encounter (Signed)
Copied from [place]. Topic: Quick Communication - See Telephone Encounter >> Aug 15, 2017  2:14 PM Synthia Innocent wrote: CRM for notification. See Telephone encounter for: 08/15/17. Patient would like to know what the next step is for her kidney lesion. Please advise. Is still having pain

## 2017-08-16 ENCOUNTER — Ambulatory Visit (INDEPENDENT_AMBULATORY_CARE_PROVIDER_SITE_OTHER): Payer: Medicare Other | Admitting: *Deleted

## 2017-08-16 DIAGNOSIS — F54 Psychological and behavioral factors associated with disorders or diseases classified elsewhere: Secondary | ICD-10-CM | POA: Diagnosis not present

## 2017-08-16 DIAGNOSIS — F451 Undifferentiated somatoform disorder: Secondary | ICD-10-CM | POA: Diagnosis not present

## 2017-08-16 DIAGNOSIS — I428 Other cardiomyopathies: Secondary | ICD-10-CM | POA: Diagnosis not present

## 2017-08-16 DIAGNOSIS — I5022 Chronic systolic (congestive) heart failure: Secondary | ICD-10-CM

## 2017-08-16 NOTE — Progress Notes (Signed)
Remote ICD transmission.   

## 2017-08-17 NOTE — Telephone Encounter (Signed)
plz notify - renal ultrasound was ok - just recommend repeat ultrasound in 1-2 yrs to monitor this. Pain is not coming from this kidney cyst.

## 2017-08-17 NOTE — Telephone Encounter (Signed)
Spoke with pt relaying results and message per Dr. Darnell Level.  Pt verbalizes understanding.  States she is still having pain in her legs and is unsteady.  Also, says her BMs go back and forth between diarrhea and constipation.  BMs have been bright yellow and smell like vomit, including her urine (smell).  Pt is aware Dr. Darnell Level is out of the office today.

## 2017-08-21 DIAGNOSIS — J329 Chronic sinusitis, unspecified: Secondary | ICD-10-CM | POA: Diagnosis not present

## 2017-08-21 DIAGNOSIS — J039 Acute tonsillitis, unspecified: Secondary | ICD-10-CM | POA: Diagnosis not present

## 2017-08-22 ENCOUNTER — Encounter: Payer: Self-pay | Admitting: Cardiology

## 2017-08-27 ENCOUNTER — Ambulatory Visit (INDEPENDENT_AMBULATORY_CARE_PROVIDER_SITE_OTHER)
Admission: RE | Admit: 2017-08-27 | Discharge: 2017-08-27 | Disposition: A | Discharge status: Home / Self Care | Payer: Medicare Other | Source: Ambulatory Visit | Attending: Family Medicine | Admitting: Family Medicine

## 2017-08-27 ENCOUNTER — Ambulatory Visit (INDEPENDENT_AMBULATORY_CARE_PROVIDER_SITE_OTHER): Payer: Medicare Other | Admitting: Family Medicine

## 2017-08-27 ENCOUNTER — Encounter: Payer: Self-pay | Admitting: Family Medicine

## 2017-08-27 VITALS — BP 122/70 | HR 87 | Temp 97.8°F | Ht 65.0 in | Wt 181.5 lb

## 2017-08-27 DIAGNOSIS — S2241XA Multiple fractures of ribs, right side, initial encounter for closed fracture: Secondary | ICD-10-CM | POA: Diagnosis not present

## 2017-08-27 DIAGNOSIS — R0781 Pleurodynia: Secondary | ICD-10-CM | POA: Diagnosis not present

## 2017-08-27 DIAGNOSIS — J329 Chronic sinusitis, unspecified: Secondary | ICD-10-CM

## 2017-08-27 DIAGNOSIS — Z8719 Personal history of other diseases of the digestive system: Secondary | ICD-10-CM

## 2017-08-27 DIAGNOSIS — R5382 Chronic fatigue, unspecified: Secondary | ICD-10-CM

## 2017-08-27 DIAGNOSIS — K582 Mixed irritable bowel syndrome: Secondary | ICD-10-CM | POA: Diagnosis not present

## 2017-08-27 DIAGNOSIS — R5381 Other malaise: Secondary | ICD-10-CM | POA: Diagnosis not present

## 2017-08-27 DIAGNOSIS — S2241XD Multiple fractures of ribs, right side, subsequent encounter for fracture with routine healing: Secondary | ICD-10-CM

## 2017-08-27 DIAGNOSIS — R1084 Generalized abdominal pain: Secondary | ICD-10-CM | POA: Diagnosis not present

## 2017-08-27 DIAGNOSIS — R2681 Unsteadiness on feet: Secondary | ICD-10-CM | POA: Diagnosis not present

## 2017-08-27 MED ORDER — TIZANIDINE HCL 4 MG PO TABS: 2.0000 mg | ORAL_TABLET | Freq: Two times a day (BID) | ORAL | 0 refills | Status: DC | PRN

## 2017-08-27 NOTE — Assessment & Plan Note (Signed)
Chronic, ongoing, anticipate contribution of IBS. Intermittent diarrhea and constipation, not consistent with infectious diarrhea or recurrent diverticulitis. Reviewed stable labs from 08/09/2017.

## 2017-08-27 NOTE — Assessment & Plan Note (Signed)
Ongoing R posterior ribcage pain after falls with known rib fractures. Update films today. Rx tizanidine 2-4mg  tabs PRN muscle spasm of R posterior back.

## 2017-08-27 NOTE — Assessment & Plan Note (Signed)
Seeing Dr Kathyrn Sheriff, has been prescribed nasal saline rinse with budesonide and gentamycin. Reviewed correct administration and discussed supplies she will need to buy to administer.

## 2017-08-27 NOTE — Assessment & Plan Note (Addendum)
She was unable to tolerate PT sessions due to fatigue and abd pain.

## 2017-08-27 NOTE — Patient Instructions (Addendum)
Try neil med sinus rinse large size bottle (259mL or 8oz bottle).  Right rib xray today Try muscle relaxant zanaflex 2mg  for right back pain.  We will be in touch with results.

## 2017-08-27 NOTE — Progress Notes (Signed)
BP 122/70 (BP Location: Left Arm, Patient Position: Sitting, Cuff Size: Normal)   Pulse 87   Temp 97.8 F (36.6 C) (Oral)   Ht 5\' 5"  (1.651 m)   Wt 181 lb 8 oz (82.3 kg)   SpO2 95%   BMI 30.20 kg/m    CC: poorly healing back pain after fall Subjective:    Patient ID: Ashley [name], female    DOB: 06-25-1945, 72 y.o.   MRN: [id number]  HPI: Ashley [name] is a 72 y.o. female presenting on 08/27/2017 for Fall (Here for follow-up. States bruise on back from 2nd fall is not really healing.) and GI Problem (C/o diarrhea and abd pain more often.)   Several falls in the last 2 months (06/2017 then again 07/16/2017 - through 07/23/2017). Persistent R posterior ribcage discomfort - only 25% better. She had completed fall prevention and balance training program in near past. Was unable to complete physical therapy.   Worsening abd discomfort and diarrhea (3-8 times a day) - burning, stinging pain, foul smelling watery diarrhea. Once had bright yellow diarrhea. Today had formed stool. Alternates diarrhea and constipation. Seeing [facility] - planned consultation with Dr Yves Dill to discuss pros/cons of colonoscopy.   Saw [facility]. Saw ENT Dr Ladene Artist. Currently treating possible chronic sinus infection with gentamycin and budesonide nasal rinses.  Relevant past medical, surgical, family and social history reviewed and updated as indicated. Interim medical history since our last visit reviewed. Allergies and medications reviewed and updated. Outpatient Medications Prior to Visit  Medication Sig Dispense Refill  . acetaminophen (TYLENOL) 500 MG tablet Take 500 mg by mouth daily as needed (PAIN).    Marland Kitchen albuterol (PROVENTIL HFA;VENTOLIN HFA) 108 (90 Base) MCG/ACT inhaler Inhale 2 puffs into the lungs every 6 (six) hours as needed for wheezing or shortness of breath. 1 Inhaler 0  . alendronate (FOSAMAX) 70 MG tablet Take 70 mg by mouth once a week. Patient takes every Thursday    . AMBULATORY NON  FORMULARY MEDICATION Medication Name:  1 squatty potty to use as needed 1 each 0  . Ascorbic Acid (VITAMIN C) 1000 MG tablet Take 1,000 mg by mouth daily.    Marland Kitchen aspirin EC 81 MG tablet Take 81 mg by mouth daily.    . benzonatate (TESSALON) 100 MG capsule Take 1 capsule (100 mg total) by mouth 2 (two) times daily as needed for cough. 40 capsule 0  . BIOTIN PO Take 1 tablet by mouth daily.    . Calcium Carbonate-Vitamin D (CALCIUM + D PO) Take 1 tablet by mouth daily.    . carvedilol (COREG) 6.25 MG tablet TAKE 1 TABLET (6.25 MG TOTAL) BY MOUTH 2 (TWO) TIMES DAILY WITH A MEAL. 180 tablet 3  . cholecalciferol (VITAMIN D) 1000 units tablet Take 2,000 Units by mouth daily.    . clindamycin (CLEOCIN) 150 MG capsule Take 1 capsule by mouth 3 (three) times daily.  0  . Coenzyme Q10 (CO Q 10 PO) Take 1 tablet by mouth daily.    Marland Kitchen dextromethorphan (DELSYM) 30 MG/5ML liquid Take 15 mg by mouth at bedtime as needed for cough.    . Diphenhyd-Hydrocort-Nystatin (FIRST-DUKES MOUTHWASH) SUSP Use as directed 5 mLs in the mouth or throat 3 (three) times daily. 237 mL 0  . ezetimibe (ZETIA) 10 MG tablet TAKE 1 TABLET (10 MG TOTAL) BY MOUTH DAILY. 30 tablet 5  . GARLIC PO Take 1 tablet by mouth daily.    Marland Kitchen losartan (COZAAR) 50 MG  tablet TAKE 1 TABLET BY MOUTH EVERY DAY 30 tablet 6  . magic mouthwash SOLN Take 5 mLs 3 (three) times daily as needed by mouth for mouth pain. 120 mL 0  . mupirocin ointment (BACTROBAN) 2 % 1(ONE) APPLICATION(S) TOPICAL 2(TWO) TIMES A DAY  0  . nystatin cream (MYCOSTATIN) Apply 1 application topically 2 (two) times daily. 60 g 1  . omeprazole (PRILOSEC) 40 MG capsule Take 1 capsule (40 mg total) by mouth daily.    Marland Kitchen oxyCODONE (ROXICODONE) 5 MG immediate release tablet Take 1 tablet (5 mg total) by mouth every 4 (four) hours as needed for severe pain. 42 tablet 0  . potassium chloride (K-DUR) 10 MEQ tablet Take 10 mEq by mouth as directed. Take daily as needed    . Pyridoxine HCl (VITAMIN  B6 PO) Take 1 capsule by mouth daily. Reported on 07/12/2015    . Red Yeast Rice 600 MG CAPS Take 1 capsule (600 mg total) by mouth 2 (two) times daily.    Marland Kitchen spironolactone (ALDACTONE) 25 MG tablet TAKE 1 TABLET BY MOUTH DAILY 90 tablet 3  . TOLAK 4 % CREA Take 1 application by mouth. EVERYDAY FOR 2 TO 3 WEEKS TO LESION ON BACK  0  . torsemide (DEMADEX) 10 MG tablet Take 10 mg by mouth daily as needed (swelling/fluid).    . triamcinolone (KENALOG) 0.025 % ointment 1(ONE) APPLICATION(S) TOPICAL 2(TWO) TIMES A DAY  0  . umeclidinium-vilanterol (ANORO ELLIPTA) 62.5-25 MCG/INH AEPB Inhale 1 puff into the lungs daily. 60 each 5  . vitamin E 100 UNIT capsule Take 100 Units by mouth daily.     No facility-administered medications prior to visit.      Per HPI unless specifically indicated in ROS section below Review of Systems     Objective:    BP 122/70 (BP Location: Left Arm, Patient Position: Sitting, Cuff Size: Normal)   Pulse 87   Temp 97.8 F (36.6 C) (Oral)   Ht 5\' 5"  (1.651 m)   Wt 181 lb 8 oz (82.3 kg)   SpO2 95%   BMI 30.20 kg/m   Wt Readings from Last 3 Encounters:  08/27/17 181 lb 8 oz (82.3 kg)  07/23/17 183 lb (83 kg)  06/19/17 180 lb (81.6 kg)    Physical Exam  Constitutional: She appears well-developed and well-nourished. No distress.  HENT:  Mouth/Throat: Oropharynx is clear and moist. No oropharyngeal exudate.  Eyes: Pupils are equal, round, and reactive to light.  Cardiovascular: Normal rate, regular rhythm and normal heart sounds.  No murmur heard. Pulmonary/Chest: Effort normal and breath sounds normal. No stridor. No respiratory distress. She has no wheezes. She has no rales.  Abdominal: Soft. She exhibits no distension and no mass. Bowel sounds are increased. There is no hepatosplenomegaly. There is generalized tenderness. There is no rebound, no guarding and no CVA tenderness. A hernia (diffuse) is present.  Musculoskeletal: She exhibits no edema.  Skin: Skin  is warm and dry.  Nursing note and vitals reviewed.  Results for orders placed or performed in visit on 08/09/17  Comprehensive metabolic panel  Result Value Ref Range   Sodium 138 135 - 145 mEq/L   Potassium 4.4 3.5 - 5.1 mEq/L   Chloride 100 96 - 112 mEq/L   CO2 30 19 - 32 mEq/L   Glucose, Bld 123 (H) 70 - 99 mg/dL   BUN 20 6 - 23 mg/dL   Creatinine, Ser 1.04 0.40 - 1.20 mg/dL   Total Bilirubin  0.3 0.2 - 1.2 mg/dL   Alkaline Phosphatase 102 39 - 117 U/L   AST 21 0 - 37 U/L   ALT 32 0 - 35 U/L   Total Protein 8.3 6.0 - 8.3 g/dL   Albumin 4.3 3.5 - 5.2 g/dL   Calcium 10.2 8.4 - 10.5 mg/dL   GFR 55.35 (L) >60.00 mL/min  CBC with Differential/Platelet  Result Value Ref Range   WBC 10.7 (H) 4.0 - 10.5 K/uL   RBC 4.20 3.87 - 5.11 Mil/uL   Hemoglobin 12.5 12.0 - 15.0 g/dL   HCT 38.2 36.0 - 46.0 %   MCV 90.8 78.0 - 100.0 fl   MCHC 32.7 30.0 - 36.0 g/dL   RDW 13.9 11.5 - 15.5 %   Platelets 276.0 150.0 - 400.0 K/uL   Neutrophils Relative % 63.3 43.0 - 77.0 %   Lymphocytes Relative 25.0 12.0 - 46.0 %   Monocytes Relative 9.4 3.0 - 12.0 %   Eosinophils Relative 1.0 0.0 - 5.0 %   Basophils Relative 1.3 0.0 - 3.0 %   Neutro Abs 6.8 1.4 - 7.7 K/uL   Lymphs Abs 2.7 0.7 - 4.0 K/uL   Monocytes Absolute 1.0 0.1 - 1.0 K/uL   Eosinophils Absolute 0.1 0.0 - 0.7 K/uL   Basophils Absolute 0.1 0.0 - 0.1 K/uL      Assessment & Plan:   Problem List Items Addressed This Visit    Abdominal pain    Chronic, ongoing, anticipate contribution of IBS. Intermittent diarrhea and constipation, not consistent with infectious diarrhea or recurrent diverticulitis. Reviewed stable labs from 08/09/2017.       Chronic fatigue and malaise   Chronic recurrent sinusitis    Seeing Dr Kathyrn Sheriff, has been prescribed nasal saline rinse with budesonide and gentamycin. Reviewed correct administration and discussed supplies she will need to buy to administer.       History of diverticular abscess   Irritable bowel  syndrome   Multiple rib fractures   Rib pain on right side - Primary    Ongoing R posterior ribcage pain after falls with known rib fractures. Update films today. Rx tizanidine 2-4mg  tabs PRN muscle spasm of R posterior back.       Relevant Orders   DG Ribs Unilateral Right   Unsteadiness on feet    She was unable to tolerate PT sessions due to fatigue and abd pain.            Meds ordered this encounter  Medications  . tiZANidine (ZANAFLEX) 4 MG tablet    Sig: Take 0.5-1 tablets (2-4 mg total) by mouth 2 (two) times daily as needed for muscle spasms (right posterior ribcage pain).    Dispense:  30 tablet    Refill:  0   Orders Placed This Encounter  Procedures  . DG Ribs Unilateral Right    Standing Status:   Future    Number of Occurrences:   1    Standing Expiration Date:   10/28/2018    Order Specific Question:   Reason for Exam (SYMPTOM  OR DIAGNOSIS REQUIRED)    Answer:   R posterior rib pain after fractures    Order Specific Question:   Preferred imaging location?    Answer:   [facility]    Order Specific Question:   Radiology Contrast Protocol - do NOT remove file path    Answer:   [email or web address]    Follow up plan: Return if symptoms worsen or fail to improve.  Garlon Hatchet  Danise Mina, MD

## 2017-08-28 ENCOUNTER — Telehealth: Payer: Self-pay

## 2017-08-28 LAB — CUP PACEART REMOTE DEVICE CHECK
Battery Voltage: 2.94 V
Brady Statistic RV Percent Paced: 0.01 %
Date Time Interrogation Session: 20190404121249
HighPow Impedance: 80 Ohm
Implantable Lead Implant Date: 20130301
Implantable Lead Location: 753860
Implantable Lead Model: 6935
Implantable Pulse Generator Implant Date: 20130301
Lead Channel Impedance Value: 475 Ohm
Lead Channel Pacing Threshold Amplitude: 0.5 V
Lead Channel Pacing Threshold Pulse Width: 0.4 ms
Lead Channel Sensing Intrinsic Amplitude: 6.75 mV
Lead Channel Sensing Intrinsic Amplitude: 6.75 mV
Lead Channel Setting Pacing Amplitude: 2.5 V
Lead Channel Setting Pacing Pulse Width: 0.4 ms
Lead Channel Setting Sensing Sensitivity: 0.3 mV

## 2017-08-28 NOTE — Telephone Encounter (Signed)
Left message on vm per dpr informing pt I don't see any messages indicating someone from our office called her this morning.  But I do see where Dr. Darnell Level sent her x-ray results to her 40.

## 2017-08-28 NOTE — Telephone Encounter (Signed)
Copied from [place] [id number]. Topic: Inquiry >> Aug 28, 2017 10:13 AM Margot Ables wrote: Reason for CRM: pt returning call that she missed at 9:02am today. She states no message was left. I do not see notes. Pt states she saw xray report in mychart. Please advise.

## 2017-08-29 DIAGNOSIS — M542 Cervicalgia: Secondary | ICD-10-CM | POA: Diagnosis not present

## 2017-08-29 DIAGNOSIS — K582 Mixed irritable bowel syndrome: Secondary | ICD-10-CM | POA: Diagnosis not present

## 2017-08-29 DIAGNOSIS — M797 Fibromyalgia: Secondary | ICD-10-CM | POA: Diagnosis not present

## 2017-09-11 NOTE — Progress Notes (Deleted)
Cardiology Office Note  Date:  09/11/2017   ID:  Ashley [name], DOB 11-10-1945, MRN [id number]  PCP:  [name], MD   No chief complaint on file.   HPI:  72 y/o female with history of  Nonischemic cardiomyopathy fibromyalgia, irritable bowel,  pneumonia, chronic cough felt secondary to GERD, abnormal PFTs in the past,  shortness of breath dating back to September 2012 that presented acutely,  Ashley [name] showed no significant ischemia,  She received pneumonia vaccine January 2017, had severe reaction previous abdominal surgery May 2015 She presents for follow-up today for follow-up of her Nonischemic cardiomyopathy  s/p slip and fall in bathtub  Ashley [name] onto right side.  At least 3 posterior right  rib fractures on CT scan Taking oxycodone 5 mg every 4 hours  still having breakthrough pain Feels like everything is in spasm back and front, difficulty getting comfortable  Chest pain, SOB started before fall Atypical in nature Long discussion concerning her symptoms  Unable to perform EKG today given her pain  Using Lasix sparingly for ankle swelling  Other past medical history reviewed  PNA in 2012 October 2012 Cardiomyopathy ejection fraction  25-35%,  Echo September 2013 with ejection fraction 30-35%, ICD placed EF 50 to 55% in 02/2013, 12/15, And November 2016 PNA vaccine 05/2015 EF 25 to 30% in 06/2015 EF 35 to 40% in 12/2016 EF 40 to 45% in 07/2016   Went to [facility], was cloudy, snowing  Seen by pulmonary, Told that she Has "chronic bronchitis, COPD". 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 Additional workup pending  EKG reviewed by myself on today's visit NSR rate 82 with no significant ST or T-wave changes, left axis deviation   Other past medical history reviewed Previously participated in cardiac rehabilitation, pulmonary rehabilitation, has not maintained her  exercise  Did not tolerate entresto (" too expensive")  Previous Gi problems, cysts ("too much bad bacteria" in her gut) Took ABX 2 weeks Abdomen still giving her trouble but improved  Problems with arthritis, received numerous cortisone shots in her hands  On a previous office visit, we started her on entresto low-dose twice a day for systolic dysfunction.She took the medication until she ran out of the medication on her recent cruise 2-3 weeks agoReports that she did not feel well on the cruise, slept many hours per day, had leg discomfortShe stopped the medication then reports that she felt better when she got home. She does not want to retry the extresto. Also reports it was very expensive for her, $179 per month  Recent stress test showed perfusion abnormality to the anterior wall, significant artifact , attenuation issue secondary to left bundle branch block Cardiogram with ejection fraction 30-35%  Possible severe reaction to pneumonia shot with what sounds like viral symptoms, Requiring prednisone, antibiotics.  chronic abdominal pain, stress from her grandchildren. Previousid ultrasound showing minimal plaque on the left less than 39%, none on the right Previous dehydration with overuse of torsemide Eating more, no regular exercise, sedentary in general secondary to fibromyalgia and fatigue  Previous admission to [name] in December 2012 for bronchitis and hypoxia as well as systolic CHF. She had antibiotics, prednisone and diuresis and has felt better since discharge. ACE inhibitor placed on hold secondary to chronic cough.  S/P ICD 10 days ago for EF <35%. Goal weight had been in the low 150 range. Previous Cardiac MRI showed ejection fraction 34 % ICD placed by  Dr. Lovena Le.  Previous symptoms of malaise and fatigue, tachycardia, weight gain, leg swelling . previously taking high doses of diuretics for weight gain causing renal dysfunction,  dehydration. For creatinine 1.9 we suggested she cut back on her diuretics. Prior weight was in the low 160 pound range  History of diverticulosis, abscess formation, perforation requiring a long course of antibiotics with PICC line, subsequent CT scan Sep 30 2013 showing free air, abscess requiring partial colectomy. She has an ostomy. Today she has lost 22 pounds by her report.   Previous echocardiogram shows improvement in her ejection fraction up to 50%. Challenging image quality. On my own review, it does appear at least 45, if not 50%.   PMH:   has a past medical history of Abdominal aortic atherosclerosis ([place]) (11/2015), Allergy, Anemia, Anxiety, Automatic implantable cardioverter-defibrillator in situ (2012), Bronchiectasis, Cancer ([place]), Chest pain, Chronic sinusitis, Chronic systolic CHF (congestive heart failure) ([place]), Depression, Diarrhea (07/14/2008), Disorder of vocal cords, Essential hypertension, Fibrocystic breast disease, Fibromyalgia, GERD (gastroesophageal reflux disease), H/O multiple allergies (10/10/2013), Hemorrhoids, History of diverticulitis of colon, History of pneumonia (2012), Insomnia, Irritable bowel syndrome, Laryngeal nodule, Lung disease (07/14/11), Migraine without aura, without mention of intractable migraine without mention of status migrainosus, Mitral regurgitation, Nonischemic cardiomyopathy ([place]), Osteoarthritis, Osteoporosis (01/2013), Pneumonia (2011), PONV (postoperative nausea and vomiting), Pre-syncope, Pure hypercholesterolemia, Sigmoid diverticulitis with abscess, perforation and fistula formation (09/25/2013), sigmoid diverticulitis with perforation, abscess and fistula (09/30/2013), and Sleep apnea (2010).  PSH:    Past Surgical History:  Procedure Laterality Date  . ABDOMINAL HYSTERECTOMY  1987   w/BSO  . ANAL RECTAL MANOMETRY N/A 02/02/2016   Procedure: ANO RECTAL MANOMETRY;  Surgeon: Mauri Pole, MD;  Location: [facility];  Service: Endoscopy;   Laterality: N/A;  . APPENDECTOMY    . AUGMENTATION MAMMAPLASTY    . BREAST BIOPSY     "I've had 2-3 bx; all benign"  . breast cystectomies     due to FCBD  . BREAST MASS EXCISION  01/2010   Left-fibrocystic change w/intraductal papilloma, no malignancy  . carotid US  99/8338   2-50% LICA, normal RICA  . [employer] Bunionectomy  11/04/08   Right Great Toe (Dr. Beola Cord)  . COLONOSCOPY  1998   nml (sigmoid-Gilbert-nol)  . Colonoscopy  01/04/04   polyps, diverticulosis/severe  . COLOSTOMY N/A 10/02/2013   DESCENDING COLOSTOMY;  Surgeon: Imogene Burn. Tsuei, MD  . Derrill Memo REVERSAL N/A 02/10/2014   [name], MD  . CT chest  12/2010   possible bronchiectasis lower lobes, 02/2011 - enlarged bilateral effusions, bibasilar atx  . CT chest  04/2011   no PE.  abnormal R hilar and mediastinal adenopathy --> on rpt 05/2011, resolved  . DEXA  03/28/99   osteopenia L/S  . DEXA  03/12/03   2.2 Spine 0.1 Hip  Osteopenia  . DEXA  11/14/05   1.9 Spine 0.3 Hip  slight improvement  . DEXA  11/22/06   improved osteo and fem neck  . DEXA  01/2013   T score -2.6 at spine  . EMG/MCV  10/16/01   + mild carpal tunnel  . ESOPHAGOGASTRODUODENOSCOPY  09/2014   small HH, irregular Z line, fundic gland polyps with mild gastritis ([place])  . Flex laryngoscopy  06/11/06   ([place]) nml  . Head MRI, Head CT  10/1998   ? H/A focus ([place])  . ICD placement  07/14/11   w/pacemaker  . IMPLANTABLE CARDIOVERTER DEFIBRILLATOR IMPLANT N/A 07/14/2011   IMPLANTABLE CARDIOVERTER DEFIBRILLATOR IMPLANT;  Surgeon: Evans Lance, MD;  Location: [name];  Service: Cardiovascular;  Laterality: N/A;  . KNEE ARTHROSCOPY W/ ORIF     Left; "meniscus tear"  . KNEE SURGERY     left  . L/S films  11/21/01   nml; Right hip nml  . LASIK     left eye  . MANDIBLE SURGERY     "got about 6 pins in the bottom of my jaw"  . MANDIBLE SURGERY    . MRI Right Hip  12/12/01   Tendonitis Gluteus Medius to Gtr Trochanter Neg Bursitis (Dr.  Eulas Post)  . MRI U/S  11/21/01   min. DDD L/3-4  . NCS  11/2014   L ulnar neuropathy, mild B median nerve entrapment ([place])  . OSTEOTOMY     Left foot  . PARTIAL COLECTOMY N/A 10/02/2013   PARTIAL COLECTOMY;  Surgeon: Imogene Burn. Tsuei, MD  . sinus surgery     x3 with balloon   . stress myoview  02/2011   no significant ischemia  . TOE AMPUTATION     "toe beside baby toe on left foot; got gangrene from corn"  . TONSILLECTOMY AND ADENOIDECTOMY    . US ECHOCARDIOGRAPHY  04/2011   mildly dilated LV, EF 25-30%, hypokinesis, restrictive physiology, mod MR, no AS    Current Outpatient Medications  Medication Sig Dispense Refill  . acetaminophen (TYLENOL) 500 MG tablet Take 500 mg by mouth daily as needed (PAIN).    Marland Kitchen albuterol (PROVENTIL HFA;VENTOLIN HFA) 108 (90 Base) MCG/ACT inhaler Inhale 2 puffs into the lungs every 6 (six) hours as needed for wheezing or shortness of breath. 1 Inhaler 0  . alendronate (FOSAMAX) 70 MG tablet Take 70 mg by mouth once a week. Patient takes every Thursday    . AMBULATORY NON FORMULARY MEDICATION Medication Name:  1 squatty potty to use as needed 1 each 0  . Ascorbic Acid (VITAMIN C) 1000 MG tablet Take 1,000 mg by mouth daily.    Marland Kitchen aspirin EC 81 MG tablet Take 81 mg by mouth daily.    . benzonatate (TESSALON) 100 MG capsule Take 1 capsule (100 mg total) by mouth 2 (two) times daily as needed for cough. 40 capsule 0  . BIOTIN PO Take 1 tablet by mouth daily.    . Calcium Carbonate-Vitamin D (CALCIUM + D PO) Take 1 tablet by mouth daily.    . carvedilol (COREG) 6.25 MG tablet TAKE 1 TABLET (6.25 MG TOTAL) BY MOUTH 2 (TWO) TIMES DAILY WITH A MEAL. 180 tablet 3  . cholecalciferol (VITAMIN D) 1000 units tablet Take 2,000 Units by mouth daily.    . clindamycin (CLEOCIN) 150 MG capsule Take 1 capsule by mouth 3 (three) times daily.  0  . Coenzyme Q10 (CO Q 10 PO) Take 1 tablet by mouth daily.    Marland Kitchen dextromethorphan (DELSYM) 30 MG/5ML liquid Take 15 mg by mouth at  bedtime as needed for cough.    . Diphenhyd-Hydrocort-Nystatin (FIRST-DUKES MOUTHWASH) SUSP Use as directed 5 mLs in the mouth or throat 3 (three) times daily. 237 mL 0  . ezetimibe (ZETIA) 10 MG tablet TAKE 1 TABLET (10 MG TOTAL) BY MOUTH DAILY. 30 tablet 5  . GARLIC PO Take 1 tablet by mouth daily.    Marland Kitchen losartan (COZAAR) 50 MG tablet TAKE 1 TABLET BY MOUTH EVERY DAY 30 tablet 6  . magic mouthwash SOLN Take 5 mLs 3 (three) times daily as needed by mouth for mouth pain. 120 mL  0  . mupirocin ointment (BACTROBAN) 2 % 1(ONE) APPLICATION(S) TOPICAL 2(TWO) TIMES A DAY  0  . nystatin cream (MYCOSTATIN) Apply 1 application topically 2 (two) times daily. 60 g 1  . omeprazole (PRILOSEC) 40 MG capsule Take 1 capsule (40 mg total) by mouth daily.    Marland Kitchen oxyCODONE (ROXICODONE) 5 MG immediate release tablet Take 1 tablet (5 mg total) by mouth every 4 (four) hours as needed for severe pain. 42 tablet 0  . potassium chloride (K-DUR) 10 MEQ tablet Take 10 mEq by mouth as directed. Take daily as needed    . Pyridoxine HCl (VITAMIN B6 PO) Take 1 capsule by mouth daily. Reported on 07/12/2015    . Red Yeast Rice 600 MG CAPS Take 1 capsule (600 mg total) by mouth 2 (two) times daily.    Marland Kitchen spironolactone (ALDACTONE) 25 MG tablet TAKE 1 TABLET BY MOUTH DAILY 90 tablet 3  . tiZANidine (ZANAFLEX) 4 MG tablet Take 0.5-1 tablets (2-4 mg total) by mouth 2 (two) times daily as needed for muscle spasms (right posterior ribcage pain). 30 tablet 0  . TOLAK 4 % CREA Take 1 application by mouth. EVERYDAY FOR 2 TO 3 WEEKS TO LESION ON BACK  0  . torsemide (DEMADEX) 10 MG tablet Take 10 mg by mouth daily as needed (swelling/fluid).    . triamcinolone (KENALOG) 0.025 % ointment 1(ONE) APPLICATION(S) TOPICAL 2(TWO) TIMES A DAY  0  . umeclidinium-vilanterol (ANORO ELLIPTA) 62.5-25 MCG/INH AEPB Inhale 1 puff into the lungs daily. 60 each 5  . vitamin E 100 UNIT capsule Take 100 Units by mouth daily.     No current facility-administered  medications for this visit.      Allergies:   Antihistamines, chlorpheniramine-type; Codeine; Cyclobenzaprine; Cymbalta [duloxetine hcl]; Influenza vac split [flu virus vaccine]; Prevnar [pneumococcal 13-val conj vacc]; Saline; Sulfasalazine; Atorvastatin; Chlorhexidine; Dilaudid [hydromorphone hcl]; Latex; Morphine and related; Pravastatin; Prevacid [lansoprazole]; Red dye; Rosuvastatin; Simvastatin; Diflucan [fluconazole]; Other; Penicillins; Sertraline; Tape; and Metaxalone   Social History:  The patient  reports that she has never smoked. She has never used smokeless tobacco. She reports that she does not drink alcohol or use drugs.   Family History:   family history includes Alcohol abuse in her paternal grandfather; Arthritis in her mother; Colon polyps in her father; Dementia in her mother; Emphysema in her mother; Heart disease in her paternal grandfather and paternal grandmother; Hypertension in her maternal grandfather; Lung cancer in her father; Osteoporosis in her mother; Stroke in her mother and paternal grandfather.    Review of Systems: Review of Systems  Respiratory: Positive for shortness of breath.   Cardiovascular: Positive for chest pain.  Gastrointestinal: Positive for abdominal pain.  Musculoskeletal: Positive for back pain.       Legs are sore to touch  Psychiatric/Behavioral: Negative.   All other systems reviewed and are negative.    PHYSICAL EXAM: VS:  There were no vitals taken for this visit. , BMI There is no height or weight on file to calculate BMI. GEN: Well nourished, well developed, in no acute distress , obese HEENT: normal  Neck: no JVD, carotid bruits, or masses Cardiac: RRR; no murmurs, rubs, or gallops,no edema  Respiratory:  clear to auscultation bilaterally, normal work of breathing GI: soft, nontender, nondistended, + BS MS: no deformity or atrophy  Skin: warm and dry, no rash Neuro:  Strength and sensation are intact Psych: euthymic mood,  full affect     Recent Labs: 08/09/2017: ALT 32; BUN  20; Creatinine, Ser 1.04; Hemoglobin 12.5; Platelets 276.0; Potassium 4.4; Sodium 138    Lipid Panel Lab Results  Component Value Date   CHOL 240 (H) 09/20/2016   HDL 44.10 09/20/2016   LDLCALC 109 (H) 05/27/2014   TRIG 224.0 (H) 09/20/2016      Wt Readings from Last 3 Encounters:  08/27/17 181 lb 8 oz (82.3 kg)  07/23/17 183 lb (83 kg)  06/19/17 180 lb (81.6 kg)    12/2015: weight 171   ASSESSMENT AND PLAN:  Chronic systolic CHF (congestive heart failure) (HCC) -  No changes to her medication regimen Previous echo with  slight improvement of her ejection fraction Did not tolerate entresto. Found it  Expensive We will continue current medications, torsemide as needed  Cardiomyopathy, dilated, nonischemic (HCC) - No further testing ordered Does not appear volume overloaded  Essential hypertension - Blood pressure is well controlled on today's visit. No changes made to the medications.  Rib fractures Following a fall 3 posterior rib fractures nondisplaced Having difficulty with pain control Scheduled to see primary care end of this week Unclear if she might benefit from muscle relaxer in addition to pain pill  ICD (implantable cardioverter-defibrillator) in place -  Followed by Dr. Lovena Le.   Chronic fatigue Likely multifactorial, sedentary condition, fibromyalgia   Total encounter time more than 25 minutes  Greater than 50% was spent in counseling and coordination of care with the patient   Disposition:   F/U  6 months   No orders of the defined types were placed in this encounter.    Signed, Esmond Plants, M.D., Ph.D. 09/11/2017  [place] [phone]\

## 2017-09-12 ENCOUNTER — Ambulatory Visit: Payer: Medicare Other | Admitting: Cardiovascular Disease

## 2017-09-12 DIAGNOSIS — K582 Mixed irritable bowel syndrome: Secondary | ICD-10-CM | POA: Diagnosis not present

## 2017-09-12 DIAGNOSIS — G8928 Other chronic postprocedural pain: Secondary | ICD-10-CM | POA: Diagnosis not present

## 2017-09-18 DIAGNOSIS — K582 Mixed irritable bowel syndrome: Secondary | ICD-10-CM | POA: Diagnosis not present

## 2017-09-18 DIAGNOSIS — K529 Noninfective gastroenteritis and colitis, unspecified: Secondary | ICD-10-CM | POA: Diagnosis not present

## 2017-09-20 ENCOUNTER — Ambulatory Visit: Payer: Self-pay | Admitting: *Deleted

## 2017-09-20 ENCOUNTER — Telehealth: Payer: Self-pay | Admitting: Family Medicine

## 2017-09-20 DIAGNOSIS — D72829 Elevated white blood cell count, unspecified: Secondary | ICD-10-CM | POA: Diagnosis not present

## 2017-09-20 DIAGNOSIS — R718 Other abnormality of red blood cells: Secondary | ICD-10-CM | POA: Diagnosis not present

## 2017-09-20 DIAGNOSIS — R778 Other specified abnormalities of plasma proteins: Secondary | ICD-10-CM | POA: Diagnosis not present

## 2017-09-20 NOTE — Telephone Encounter (Signed)
Pt called because she had gone to [facility] and had labs drawn. She said that her legs had started swelling so she started taking potassium. So today her K+ level is 5 from the lab draw today. She had taken the extra potassium for the last 4 days but now has stopped. Also her kidney function is 1.5 per patient. Denied any other symptoms. Will notify flow at [facility] at [facility]. Pt would like a call back and to let her pcp know that [facility] will be sending her lab results to him.  Reason for Disposition . Nursing judgment or information in reference  Answer Assessment - Initial Assessment Questions 1. REASON FOR CALL: "What is your main concern right now?"     Potassium level up to 5 2. ONSET: "When did the ___ start?"     today 6. INTERVENTIONS AND RESPONSE: "What have you done so far to try to make this better? What medications have you used?"     Stopped taking potassium tablets  Protocols used: NO GUIDELINE AVAILABLE-A-AH

## 2017-09-20 NOTE — Telephone Encounter (Signed)
Copied from [place] [id number]. Topic: Quick Communication - See Telephone Encounter >> Sep 20, 2017  3:15 PM Neva Seat wrote: [facility] - Sandi Mariscal  Faxing Labs for pt.  Pt's kidney function is off.

## 2017-09-21 NOTE — Telephone Encounter (Signed)
This was addressed by Dr. Silvio Pate, see previous phone note.

## 2017-09-24 ENCOUNTER — Ambulatory Visit: Payer: Self-pay

## 2017-09-24 NOTE — Telephone Encounter (Signed)
Pt calling with report of near syncopal episode last night. She reports that she has had multiple similar episodes in the past. Pt stated her stomach was hurting and she went to get some crackers to settle her stomach. She states she began to feel faint and laid down on the kitchen floor and felt better. She states that she never fainted or lost consciousness. She stated she feels like she has fluid in her head like from a sinus pressure. Pt stated that she has felt like this many times in the past and she said she knew what to do when she feels faints. Pt thinks it is form decreased food intake. Pt states she was seen at [facility] for her GI disorder (IBS) and [facility] put pt on an "elimination diet." Advised pt if she feels faint again or worsens she needs to go to the ED.  Care advice given. Appt made for tomorrow.   Reason for Disposition . [1] MODERATE dizziness (e.g., interferes with normal activities) AND [2] has NOT been evaluated by physician for this  (Exception: dizziness caused by heat exposure, sudden standing, or poor fluid intake)  Answer Assessment - Initial Assessment Questions 1. DESCRIPTION: "Describe your dizziness."     Felt woozy and felt faint 2. LIGHTHEADED: "Do you feel lightheaded?" (e.g., somewhat faint, woozy, weak upon standing)     yes 3. VERTIGO: "Do you feel like either you or the room is spinning or tilting?" (i.e. vertigo)     no 4. SEVERITY: "How bad is it?"  "Do you feel like you are going to faint?" "Can you stand and walk?"   - MILD - walking normally   - MODERATE - interferes with normal activities (e.g., work, school)    - SEVERE - unable to stand, requires support to walk, feels like passing out now.      mild 5. ONSET:  "When did the dizziness begin?"    0500 this am  6. AGGRAVATING FACTORS: "Does anything make it worse?" (e.g., standing, change in head position)     no 7. HEART RATE: "Can you tell me your heart rate?" "How many beats in 15 seconds?"  (Note: not  all patients can do this)       BP cuff at home 8. CAUSE: "What do you think is causing the dizziness?"     Change in diet "maybe not eating enough" 9. RECURRENT SYMPTOM: "Have you had dizziness before?" If so, ask: "When was the last time?" "What happened that time?"     Yes-since pt had colon removed-self resolved 10. OTHER SYMPTOMS: "Do you have any other symptoms?" (e.g., fever, chest pain, vomiting, diarrhea, bleeding)       Doesn't feel right like fluid in her head 11. PREGNANCY: "Is there any chance you are pregnant?" "When was your last menstrual period?"       n/a  Protocols used: DIZZINESS [facility]

## 2017-09-24 NOTE — Telephone Encounter (Signed)
I will see her then  

## 2017-09-24 NOTE — Telephone Encounter (Signed)
Pt has appt with Dr Glori Bickers 09/25/17 at [facility].

## 2017-09-25 ENCOUNTER — Encounter: Payer: Self-pay | Admitting: Family Medicine

## 2017-09-25 ENCOUNTER — Ambulatory Visit (INDEPENDENT_AMBULATORY_CARE_PROVIDER_SITE_OTHER): Payer: Medicare Other | Admitting: Family Medicine

## 2017-09-25 VITALS — BP 100/52 | HR 77 | Temp 98.0°F | Ht 65.0 in | Wt 177.8 lb

## 2017-09-25 DIAGNOSIS — E860 Dehydration: Secondary | ICD-10-CM

## 2017-09-25 DIAGNOSIS — I10 Essential (primary) hypertension: Secondary | ICD-10-CM | POA: Diagnosis not present

## 2017-09-25 DIAGNOSIS — I951 Orthostatic hypotension: Secondary | ICD-10-CM | POA: Diagnosis not present

## 2017-09-25 NOTE — Assessment & Plan Note (Signed)
Suspect mild dehydration from diarrhea- by weight and symptoms (mild orthostasis)  Pt has hx of chf with fluid restriction-she cannot tell me what  Adv to hold torsemide tomorrow and take 1/2 of her regular aldactone dose  Lab today  Alert if no clinical improvement

## 2017-09-25 NOTE — Progress Notes (Signed)
Subjective:    Patient ID: Ashley [name], female    DOB: 05/14/1946, 72 y.o.   MRN: [id number]  HPI 72 yo pt of Dr Darnell Level here for a near syncopal episode Hx of CHF  And HTN and per pt CKD  Note also hx of orthostatic hypotension    Symptoms started last Tuesday  Was at [facility] for a GI f/u    (she has intermittent diarrhea and constipation) as well as diverticulitis and abscesses in the past  Is on a "limitation" diet for diarrhea  Thinks she became dehydrated  (labs at [place] were abnormal)   Came home Sunday from lunch and lay on the couch  Felt ringing in ears/back of skull- felt poorly  Got nauseated - did not vomit  30 minutes later decided to eat crackers, went to kitchen and then got dizzy and had to lie down on the floor  Never lost consciousness but almost did  After that extremely tired/difficult to concentrate and head felt funny  ? If bp dropped low  She made an effort to drink lots of fluids -water   Takes losartan 50 mg  Aldactone 25  Torsemide - 10 mg (about twice weekly)-- if ankles are swollen or if weight goes up Also K  Cardiology has her on fluid restriction -she is unsure how much-- she does not measure   Today she feels a little buzzy (top of her head)   No trauma  No stressful events   Diarrhea is improved with the new diet    Wt Readings from Last 3 Encounters:  09/25/17 177 lb 12 oz (80.6 kg)  08/27/17 181 lb 8 oz (82.3 kg)  07/23/17 183 lb (83 kg)   29.58 kg/m   BP Readings from Last 3 Encounters:  09/25/17 108/64  08/27/17 122/70  07/23/17 118/78   Pulse Readings from Last 3 Encounters:  09/25/17 77  08/27/17 87  07/23/17 93   BP  at home - 97/55, 100/53/ 99/49/111/54-- pretty low    Patient Active Problem List   Diagnosis Date Noted  . Dehydration 09/25/2017  . Acquired renal cyst of right kidney 08/11/2017  . Unsteadiness on feet 07/24/2017  . Rib pain on right side 07/23/2017  . Multiple rib fractures 06/19/2017  . 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 High total serum IgM 11/22/2016  . 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 Bleeding external hemorrhoids 10/31/2016  . LBBB (left bundle branch block) 07/11/2016  . Gastritis 06/12/2016  . Bladder wall thickening 02/16/2016  . Fecal incontinence   . Abdominal pain 11/22/2015  . Abdominal aortic atherosclerosis ([place]) 11/13/2015  . 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  . Hoarseness 06/21/2015  . Hypersensitivity to pneumococcal vaccine 06/10/2015  . Medicare annual wellness visit, subsequent 06/08/2015  . Advanced care planning/counseling discussion 06/08/2015  . Intertrigo 04/06/2015  . Chronic daily headache 04/06/2015  . Neuropathy 04/06/2015  . Essential hypertension   . Nonischemic cardiomyopathy ([place])   . Chest pain and shortness of breath 12/25/2014  . S/P colostomy takedown 02/10/2014  . ICD (implantable cardioverter-defibrillator) in place 10/23/2013  . H/O multiple allergies 10/10/2013  . History of diverticular abscess 09/25/2013  . Osteoporosis 01/13/2013  . Irregular heart beat 08/06/2012  . MDD (major depressive disorder), recurrent episode, moderate ([place]) 07/09/2012  . Chronic recurrent sinusitis 06/17/2012  . Vitamin D deficiency 03/05/2012  . Essential tremor 12/29/2011  . GERD (gastroesophageal reflux disease) 11/13/2011  . Allergic rhinitis 11/13/2011  . CKD (chronic kidney disease) stage 3,  GFR 30-59 ml/min ([place]) 11/09/2011  . Epistaxis, recurrent 10/11/2011  . Orthostatic hypotension 04/30/2011  . Cardiomyopathy, dilated, nonischemic ([place]) 03/01/2011  . Mitral regurgitation 02/24/2011  . Chronic systolic CHF (congestive heart failure) ([place]) 02/24/2011  . Chronic bronchitis ([place]) 01/20/2011  . Chronic fatigue and malaise 11/02/2010  . Anxiety state 08/18/2010  . 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  . Dyslipidemia 11/22/2006  . COMMON MIGRAINE 11/22/2006  . Irritable bowel syndrome 11/22/2006  . FIBROCYSTIC BREAST DISEASE 11/22/2006  .  Osteoarthritis 11/22/2006  . Fibromyalgia 11/22/2006   Past Medical History:  Diagnosis Date  . Abdominal aortic atherosclerosis ([place]) 11/2015   by CT  . Allergy   . Anemia   . Anxiety   . Automatic implantable cardioverter-defibrillator in situ 2012   a. MDT single lead ICD.  Marland Kitchen Bronchiectasis    a. possibly mild, treat URIs aggressively  . Cancer (HCC)    basal cell, arms & neck  . Chest pain    a. prev neg MV.  . Chronic sinusitis   . Chronic systolic CHF (congestive heart failure) (HCC)    a. EF 25-35%, LV dilation, significant MR, goal weight 150lbs;  b. 04/2014 Echo: EF 50-55%, Gr 1 DD.  Marland Kitchen Depression   . Diarrhea 07/14/2008   Qualifier: Diagnosis of  By: Glori Bickers MD, Carmell Austria   . Disorder of vocal cords   . Essential hypertension   . Fibrocystic breast disease   . Fibromyalgia   . GERD (gastroesophageal reflux disease)   . H/O multiple allergies 10/10/2013  . Hemorrhoids   . History of diverticulitis of colon   . History of pneumonia 2012  . Insomnia   . Irritable bowel syndrome   . Laryngeal nodule    Dr. Thomasena Edis  . Lung disease 07/14/11  . Migraine without aura, without mention of intractable migraine without mention of status migrainosus   . Mitral regurgitation   . Nonischemic cardiomyopathy ([place])    a. Recovered LV fxn - prev as low as 25%, now 50-55% by echo 04/2014;  b. 2012 s/p MDT [id number] [place] VR single lead ICD.  Marland Kitchen Osteoarthritis    knees, back, hands, low back,   . Osteoporosis 01/2013   T-2.7 spine (01/2016)  . Pneumonia 2011  . PONV (postoperative nausea and vomiting)   . Pre-syncope   . Pure hypercholesterolemia   . Sigmoid diverticulitis with abscess, perforation and fistula formation 09/25/2013  . sigmoid diverticulitis with perforation, abscess and fistula 09/30/2013  . Sleep apnea 2010   borderline, inconclusive- /w [place]    Past Surgical History:  Procedure Laterality Date  . ABDOMINAL HYSTERECTOMY  1987   w/BSO  . ANAL RECTAL MANOMETRY  N/A 02/02/2016   Procedure: ANO RECTAL MANOMETRY;  Surgeon: Mauri Pole, MD;  Location: [facility];  Service: Endoscopy;  Laterality: N/A;  . APPENDECTOMY    . AUGMENTATION MAMMAPLASTY    . BREAST BIOPSY     "I've had 2-3 bx; all benign"  . breast cystectomies     due to FCBD  . BREAST MASS EXCISION  01/2010   Left-fibrocystic change w/intraductal papilloma, no malignancy  . carotid US  59/5638   7-56% LICA, normal RICA  . [employer] Bunionectomy  11/04/08   Right Great Toe (Dr. Beola Cord)  . COLONOSCOPY  1998   nml (sigmoid-Gilbert-nol)  . Colonoscopy  01/04/04   polyps, diverticulosis/severe  . COLOSTOMY N/A 10/02/2013   DESCENDING COLOSTOMY;  Surgeon: Imogene Burn. Tsuei, [place]  N/A 02/10/2014   [name], MD  . CT chest  12/2010   possible bronchiectasis lower lobes, 02/2011 - enlarged bilateral effusions, bibasilar atx  . CT chest  04/2011   no PE.  abnormal R hilar and mediastinal adenopathy --> on rpt 05/2011, resolved  . DEXA  03/28/99   osteopenia L/S  . DEXA  03/12/03   2.2 Spine 0.1 Hip  Osteopenia  . DEXA  11/14/05   1.9 Spine 0.3 Hip  slight improvement  . DEXA  11/22/06   improved osteo and fem neck  . DEXA  01/2013   T score -2.6 at spine  . EMG/MCV  10/16/01   + mild carpal tunnel  . ESOPHAGOGASTRODUODENOSCOPY  09/2014   small HH, irregular Z line, fundic gland polyps with mild gastritis ([place])  . Flex laryngoscopy  06/11/06   ([place]) nml  . Head MRI, Head CT  10/1998   ? H/A focus ([place])  . ICD placement  07/14/11   w/pacemaker  . IMPLANTABLE CARDIOVERTER DEFIBRILLATOR IMPLANT N/A 07/14/2011   IMPLANTABLE CARDIOVERTER DEFIBRILLATOR IMPLANT;  Surgeon: Evans Lance, MD;  Location[facility] CATH LAB;  Service: Cardiovascular;  Laterality: N/A;  . KNEE ARTHROSCOPY W/ ORIF     Left; "meniscus tear"  . KNEE SURGERY     left  . L/S films  11/21/01   nml; Right hip nml  . LASIK     left eye  . MANDIBLE SURGERY     "got about 6 pins in the bottom  of my jaw"  . MANDIBLE SURGERY    . MRI Right Hip  12/12/01   Tendonitis Gluteus Medius to Gtr Trochanter Neg Bursitis (Dr. Eulas Post)  . MRI U/S  11/21/01   min. DDD L/3-4  . NCS  11/2014   L ulnar neuropathy, mild B median nerve entrapment ([place])  . OSTEOTOMY     Left foot  . PARTIAL COLECTOMY N/A 10/02/2013   PARTIAL COLECTOMY;  Surgeon: Imogene Burn. Tsuei, MD  . sinus surgery     x3 with balloon   . stress myoview  02/2011   no significant ischemia  . TOE AMPUTATION     "toe beside baby toe on left foot; got gangrene from corn"  . TONSILLECTOMY AND ADENOIDECTOMY    . US ECHOCARDIOGRAPHY  04/2011   mildly dilated LV, EF 25-30%, hypokinesis, restrictive physiology, mod MR, no AS   Social History   Tobacco Use  . Smoking status: Never Smoker  . Smokeless tobacco: Never Used  . Tobacco comment: Lived with smokers x 23 yrs, smoked herself "for a week"  Substance Use Topics  . Alcohol use: No    Alcohol/week: 0.0 oz  . Drug use: No   Family History  Problem Relation Age of Onset  . Lung cancer Father        + smoker  . Colon polyps Father   . Dementia Mother   . Osteoporosis Mother        Lumbar spine  . Stroke Mother        x 5 @ 64 YOA  . Arthritis Mother        hands  . Emphysema Mother   . Alcohol abuse Paternal Grandfather   . Stroke Paternal Grandfather        ?  Marland Kitchen Heart disease Paternal Grandfather   . Hypertension Maternal Grandfather        ?  Marland Kitchen Heart disease Paternal Grandmother   . Colon cancer Neg Hx   .  Esophageal cancer Neg Hx   . Gallbladder disease Neg Hx    Allergies  Allergen Reactions  . Antihistamines, Chlorpheniramine-Type Other (See Comments)    Makes her eyes look like she sees strobe lights  . Codeine Other (See Comments)    Confusion and dizziness  . Cyclobenzaprine Other (See Comments)    Adverse reaction - back and throat pain, dizziness, exhaustion  . Cymbalta [Duloxetine Hcl] Other (See Comments)    "body spasms and made me feel  weird in my head"   . Influenza Vac Split [Flu Virus Vaccine] Other (See Comments)    "allergic to concentrated eggs that are in vaccine"  . Prevnar [Pneumococcal 13-Val Conj Vacc] Other (See Comments)    Egg allergy Bad reaction after shot  . Saline Rash    FEVER, BURNING  . Sulfasalazine Other (See Comments)    "makes my whole body smell like sulfa & makes me sick just smelling it"  . Atorvastatin Other (See Comments)    Severe muscle cramps to generic atorvastatin.  Able to take brand lipitor.  . Chlorhexidine Other (See Comments)    blisters  . Dilaudid [Hydromorphone Hcl] Other (See Comments)    Feels like she is burning internally   . Latex Nausea Only, Rash and Other (See Comments)    Pain and infection  . Morphine And Related Other (See Comments)    Internally feels like she is on fire   . Pravastatin Other (See Comments)    myalgias  . Prevacid [Lansoprazole] Other (See Comments)    Worsened GI side effects  . Red Dye Nausea Only and Swelling  . Rosuvastatin Other (See Comments)    Was not effective controlling lipids  . Simvastatin Other (See Comments)    Leg cramps  . Diflucan [Fluconazole] Nausea And Vomiting  . Other     NOTE: pt is able to take cephalosporins without reaction  . Penicillins Other (See Comments)    CHILDHOOD ALLEGY/REACTION: "told 50 years ago I couldn't take it; might be immune to it", able to take amoxicillin Has patient had a PCN reaction causing immediate rash, facial/tongue/throat swelling, SOB or lightheadedness with hypotension: Unknown Has patient had a PCN reaction causing severe rash involving mucus membranes or skin necrosis: UnknowN Has patient had a PCN reaction that required hospitalization: /Unknown Has patient had a PCN reaction occurring within the last 10 years: Unknown If a  . Sertraline Other (See Comments)    Overly sedating  . Tape Other (See Comments)    All adhesives  - Blisters, itching and burning   . Metaxalone  Other (See Comments)    REACTION: unknown   Current Outpatient Medications on File Prior to Visit  Medication Sig Dispense Refill  . acetaminophen (TYLENOL) 500 MG tablet Take 500 mg by mouth daily as needed (PAIN).    Marland Kitchen alendronate (FOSAMAX) 70 MG tablet Take 70 mg by mouth once a week. Patient takes every Thursday    . AMBULATORY NON FORMULARY MEDICATION Medication Name:  1 squatty potty to use as needed 1 each 0  . Ascorbic Acid (VITAMIN C) 1000 MG tablet Take 1,000 mg by mouth daily.    Marland Kitchen aspirin EC 81 MG tablet Take 81 mg by mouth daily.    Marland Kitchen BIOTIN PO Take 1 tablet by mouth daily.    . Calcium Carbonate-Vitamin D (CALCIUM + D PO) Take 1 tablet by mouth daily.    . carvedilol (COREG) 6.25 MG tablet TAKE 1 TABLET (6.25 MG  TOTAL) BY MOUTH 2 (TWO) TIMES DAILY WITH A MEAL. 180 tablet 3  . cholecalciferol (VITAMIN D) 1000 units tablet Take 2,000 Units by mouth daily.    . Coenzyme Q10 (CO Q 10 PO) Take 1 tablet by mouth daily.    Marland Kitchen dextromethorphan (DELSYM) 30 MG/5ML liquid Take 15 mg by mouth at bedtime as needed for cough.    . ezetimibe (ZETIA) 10 MG tablet TAKE 1 TABLET (10 MG TOTAL) BY MOUTH DAILY. 30 tablet 5  . GARLIC PO Take 1 tablet by mouth daily.    Marland Kitchen losartan (COZAAR) 50 MG tablet TAKE 1 TABLET BY MOUTH EVERY DAY 30 tablet 6  . mupirocin ointment (BACTROBAN) 2 % 1(ONE) APPLICATION(S) TOPICAL 2(TWO) TIMES A DAY  0  . nystatin cream (MYCOSTATIN) Apply 1 application topically 2 (two) times daily. 60 g 1  . potassium chloride (K-DUR) 10 MEQ tablet Take 10 mEq by mouth as directed. Take daily as needed    . Pyridoxine HCl (VITAMIN B6 PO) Take 1 capsule by mouth daily. Reported on 07/12/2015    . Red Yeast Rice 600 MG CAPS Take 1 capsule (600 mg total) by mouth 2 (two) times daily.    Marland Kitchen spironolactone (ALDACTONE) 25 MG tablet TAKE 1 TABLET BY MOUTH DAILY 90 tablet 3  . torsemide (DEMADEX) 10 MG tablet Take 10 mg by mouth daily as needed (swelling/fluid).    . vitamin E 100 UNIT capsule  Take 100 Units by mouth daily.    . [DISCONTINUED] bisoprolol (ZEBETA) 5 MG tablet Take 5 mg by mouth daily.     No current facility-administered medications on file prior to visit.     Review of Systems  Constitutional: Positive for fatigue. Negative for activity change, appetite change, fever and unexpected weight change.  HENT: Negative for congestion, ear pain, rhinorrhea, sinus pressure and sore throat.   Eyes: Negative for pain, redness and visual disturbance.  Respiratory: Negative for cough, shortness of breath and wheezing.   Cardiovascular: Negative for chest pain and palpitations.  Gastrointestinal: Negative for abdominal pain, blood in stool, constipation and diarrhea.  Endocrine: Negative for polydipsia and polyuria.  Genitourinary: Negative for dysuria, frequency and urgency.  Musculoskeletal: Negative for arthralgias, back pain and myalgias.  Skin: Negative for pallor and rash.  Allergic/Immunologic: Negative for environmental allergies.  Neurological: Positive for light-headedness. Negative for dizziness, tremors, seizures, syncope, facial asymmetry, speech difficulty, weakness, numbness and headaches.  Hematological: Negative for adenopathy. Does not bruise/bleed easily.  Psychiatric/Behavioral: Negative for decreased concentration and dysphoric mood. The patient is not nervous/anxious.        Objective:   Physical Exam  Constitutional: She appears well-developed and well-nourished. No distress.  Well appearing   HENT:  Head: Normocephalic and atraumatic.  Mouth/Throat: Oropharynx is clear and moist.  Nl TMs  Eyes: Pupils are equal, round, and reactive to light. Conjunctivae and EOM are normal.  Neck: Normal range of motion. Neck supple. No JVD present. Carotid bruit is not present. No thyromegaly present.  Cardiovascular: Normal rate, regular rhythm, normal heart sounds and intact distal pulses. Exam reveals no gallop.  Pulmonary/Chest: Effort normal and breath  sounds normal. No respiratory distress. She has no wheezes. She has no rales.  No crackles  Abdominal: Soft. Bowel sounds are normal. She exhibits no distension, no abdominal bruit and no mass. There is no tenderness.  Musculoskeletal: She exhibits no edema.  Lymphadenopathy:    She has no cervical adenopathy.  Neurological: She is alert. She has normal  reflexes. She displays normal reflexes. No cranial nerve deficit or sensory deficit. She exhibits normal muscle tone. Coordination normal.  Nl gait    Skin: Skin is warm and dry. Capillary refill takes less than 2 seconds. No rash noted. No pallor.  Psychiatric: Her mood appears anxious. Her speech is tangential.  Tangential  Mildly anxious  Somewhat difficult to follow in conversation at times            Assessment & Plan:   Problem List Items Addressed This Visit      Cardiovascular and Mediastinum   Essential hypertension - Primary    Orthostatic mildly today  Lab ordered inst to hold torsemide and cut aldactone dose 1/2 tomorrow Poss dehydrated from recent diarrhea       Relevant Orders   Renal function panel   Orthostatic hypotension    Mild today in setting of poss mild dehydration from diarrhea  Will hold torsemide tomorrow Cut aldactone in 1/2  Continue to follow wt  Lab today  Alert if not feeling better / use walking aide if necessary        Relevant Orders   Renal function panel     Other   Dehydration    Suspect mild dehydration from diarrhea- by weight and symptoms (mild orthostasis)  Pt has hx of chf with fluid restriction-she cannot tell me what  Adv to hold torsemide tomorrow and take 1/2 of her regular aldactone dose  Lab today  Alert if no clinical improvement       Relevant Orders   Renal function panel

## 2017-09-25 NOTE — Assessment & Plan Note (Signed)
Mild today in setting of poss mild dehydration from diarrhea  Will hold torsemide tomorrow Cut aldactone in 1/2  Continue to follow wt  Lab today  Alert if not feeling better / use walking aide if necessary

## 2017-09-25 NOTE — Patient Instructions (Signed)
I think you are mildly dehydrated and this is causing low blood pressure  For this reason -let's get some labs today  Cut your spironolactone in 1/2 until we get labs back  Also do not take torsemide tomorrow   If you feel like the heart failure worsens-please alert me   Change position slowly - sit for a while before you stand  When standing- give yourself some time before you start walking to see how you feel   If you have a walker or cane at home - use it if you feel lightheaded

## 2017-09-25 NOTE — Assessment & Plan Note (Signed)
Orthostatic mildly today  Lab ordered inst to hold torsemide and cut aldactone dose 1/2 tomorrow Poss dehydrated from recent diarrhea

## 2017-09-26 LAB — RENAL FUNCTION PANEL
Albumin: 4.6 g/dL (ref 3.5–5.2)
BUN: 45 mg/dL — ABNORMAL HIGH (ref 6–23)
CO2: 23 mEq/L (ref 19–32)
Calcium: 10.5 mg/dL (ref 8.4–10.5)
Chloride: 99 mEq/L (ref 96–112)
Creatinine, Ser: 1.41 mg/dL — ABNORMAL HIGH (ref 0.40–1.20)
GFR: 38.94 mL/min — ABNORMAL LOW (ref >60.00)
Glucose, Bld: 95 mg/dL (ref 70–99)
Phosphorus: 4.2 mg/dL (ref 2.3–4.6)
Potassium: 4.7 mEq/L (ref 3.5–5.1)
Sodium: 137 mEq/L (ref 135–145)

## 2017-10-02 ENCOUNTER — Ambulatory Visit: Payer: Medicare Other | Admitting: Physician Assistant

## 2017-10-15 ENCOUNTER — Ambulatory Visit (INDEPENDENT_AMBULATORY_CARE_PROVIDER_SITE_OTHER): Payer: Medicare Other | Admitting: Nurse Practitioner

## 2017-10-15 ENCOUNTER — Encounter: Payer: Self-pay | Admitting: Nurse Practitioner

## 2017-10-15 VITALS — BP 124/68 | HR 85 | Ht 65.5 in | Wt 173.0 lb

## 2017-10-15 DIAGNOSIS — I5042 Chronic combined systolic (congestive) and diastolic (congestive) heart failure: Secondary | ICD-10-CM | POA: Diagnosis not present

## 2017-10-15 DIAGNOSIS — R7989 Other specified abnormal findings of blood chemistry: Secondary | ICD-10-CM

## 2017-10-15 DIAGNOSIS — Z01810 Encounter for preprocedural cardiovascular examination: Secondary | ICD-10-CM

## 2017-10-15 DIAGNOSIS — R0789 Other chest pain: Secondary | ICD-10-CM

## 2017-10-15 DIAGNOSIS — I10 Essential (primary) hypertension: Secondary | ICD-10-CM | POA: Diagnosis not present

## 2017-10-15 DIAGNOSIS — R0602 Shortness of breath: Secondary | ICD-10-CM | POA: Diagnosis not present

## 2017-10-15 DIAGNOSIS — E782 Mixed hyperlipidemia: Secondary | ICD-10-CM | POA: Diagnosis not present

## 2017-10-15 NOTE — Progress Notes (Signed)
Office Visit    Patient Name: Ashley [name] Date of Encounter: 10/15/2017  Primary Care Provider:  [name], MD Primary Cardiologist:  [name], MD  Chief Complaint    72 year old female with a history of nonischemic cardiomyopathy, HFrEF, hypertension, hyperlipidemia, status post ICD placement, and chronic diarrhea, who presents for follow-up as she is pending colonoscopy under general anesthesia.  Past Medical History    Past Medical History:  Diagnosis Date  . Abdominal aortic atherosclerosis ([place]) 11/2015   by CT  . Allergy   . Anemia   . Anxiety   . Automatic implantable cardioverter-defibrillator in situ 2012   a. MDT single lead ICD.  Marland Kitchen Bronchiectasis    a. possibly mild, treat URIs aggressively  . Cancer (HCC)    basal cell, arms & neck  . Chest pain    a. 06/2015 MV: large defect of mod severity in mid ant, mid antsept, apical ant, and apical location. Prior ant MI w/ peri-inf ischemia. Suboptimal due to GI uptake and LBBB. EF 37%.  . Chronic sinusitis   . Chronic systolic CHF (congestive heart failure) (HCC)    a. EF 25-35%, LV dilation, significant MR, goal weight 150lbs;  b. 04/2014 Echo: EF 50-55%, Gr 1 DD; c. 03/2015 Echo: EF 50-55%, Gr1 DD; d. 06/2015 Echo: EF 30-35%, Gr1 DD; e. 07/2016 Echo: EF 40-45%, diff HK, Gr1 DD, nl PASP.  Marland Kitchen Depression   . Diarrhea 07/14/2008   Qualifier: Diagnosis of  By: Glori Bickers MD, Carmell Austria   . Disorder of vocal cords   . Essential hypertension   . Fibrocystic breast disease   . Fibromyalgia   . GERD (gastroesophageal reflux disease)   . H/O multiple allergies 10/10/2013  . Hemorrhoids   . History of diverticulitis of colon   . History of pneumonia 2012  . Insomnia   . Irritable bowel syndrome   . Laryngeal nodule    Dr. Thomasena Edis  . Lung disease 07/14/11  . Migraine without aura, without mention of intractable migraine without mention of status migrainosus   . Mitral regurgitation   . NICM (nonischemic  cardiomyopathy) ([place])   . Nonischemic cardiomyopathy ([place])    a. Recovered LV fxn - prev as low as 25%, now 50-55% by echo 04/2014;  b. 2012 s/p MDT [id number] [place] VR single lead ICD.  Marland Kitchen Osteoarthritis    knees, back, hands, low back,   . Osteoporosis 01/2013   T-2.7 spine (01/2016)  . Pneumonia 2011  . PONV (postoperative nausea and vomiting)   . Pre-syncope   . Pure hypercholesterolemia   . Sigmoid diverticulitis with abscess, perforation and fistula formation 09/25/2013  . sigmoid diverticulitis with perforation, abscess and fistula 09/30/2013  . Sleep apnea 2010   borderline, inconclusive- /w [place]    Past Surgical History:  Procedure Laterality Date  . ABDOMINAL HYSTERECTOMY  1987   w/BSO  . ANAL RECTAL MANOMETRY N/A 02/02/2016   Procedure: ANO RECTAL MANOMETRY;  Surgeon: Mauri Pole, MD;  Location: [facility];  Service: Endoscopy;  Laterality: N/A;  . APPENDECTOMY    . AUGMENTATION MAMMAPLASTY    . BREAST BIOPSY     "I've had 2-3 bx; all benign"  . breast cystectomies     due to FCBD  . BREAST MASS EXCISION  01/2010   Left-fibrocystic change w/intraductal papilloma, no malignancy  . carotid US  93/7169   6-78% LICA, normal RICA  . [employer] Bunionectomy  11/04/08   Right Great Toe (Dr.  Beola Cord)  . COLONOSCOPY  1998   nml (sigmoid-Gilbert-nol)  . Colonoscopy  01/04/04   polyps, diverticulosis/severe  . COLOSTOMY N/A 10/02/2013   DESCENDING COLOSTOMY;  Surgeon: Imogene Burn. Tsuei, MD  . Derrill Memo REVERSAL N/A 02/10/2014   [name], MD  . CT chest  12/2010   possible bronchiectasis lower lobes, 02/2011 - enlarged bilateral effusions, bibasilar atx  . CT chest  04/2011   no PE.  abnormal R hilar and mediastinal adenopathy --> on rpt 05/2011, resolved  . DEXA  03/28/99   osteopenia L/S  . DEXA  03/12/03   2.2 Spine 0.1 Hip  Osteopenia  . DEXA  11/14/05   1.9 Spine 0.3 Hip  slight improvement  . DEXA  11/22/06   improved osteo and fem neck  . DEXA  01/2013   T  score -2.6 at spine  . EMG/MCV  10/16/01   + mild carpal tunnel  . ESOPHAGOGASTRODUODENOSCOPY  09/2014   small HH, irregular Z line, fundic gland polyps with mild gastritis ([place])  . Flex laryngoscopy  06/11/06   ([place]) nml  . Head MRI, Head CT  10/1998   ? H/A focus ([place])  . ICD placement  07/14/11   w/pacemaker  . IMPLANTABLE CARDIOVERTER DEFIBRILLATOR IMPLANT N/A 07/14/2011   IMPLANTABLE CARDIOVERTER DEFIBRILLATOR IMPLANT;  Surgeon: Evans Lance, MD;  Location: [facility] CATH LAB;  Service: Cardiovascular;  Laterality: N/A;  . KNEE ARTHROSCOPY W/ ORIF     Left; "meniscus tear"  . KNEE SURGERY     left  . L/S films  11/21/01   nml; Right hip nml  . LASIK     left eye  . MANDIBLE SURGERY     "got about 6 pins in the bottom of my jaw"  . MANDIBLE SURGERY    . MRI Right Hip  12/12/01   Tendonitis Gluteus Medius to Gtr Trochanter Neg Bursitis (Dr. Eulas Post)  . MRI U/S  11/21/01   min. DDD L/3-4  . NCS  11/2014   L ulnar neuropathy, mild B median nerve entrapment ([place])  . OSTEOTOMY     Left foot  . PARTIAL COLECTOMY N/A 10/02/2013   PARTIAL COLECTOMY;  Surgeon: Imogene Burn. Tsuei, MD  . sinus surgery     x3 with balloon   . stress myoview  02/2011   no significant ischemia  . TOE AMPUTATION     "toe beside baby toe on left foot; got gangrene from corn"  . TONSILLECTOMY AND ADENOIDECTOMY    . US ECHOCARDIOGRAPHY  04/2011   mildly dilated LV, EF 25-30%, hypokinesis, restrictive physiology, mod MR, no AS    Allergies  Allergies  Allergen Reactions  . Antihistamines, Chlorpheniramine-Type Other (See Comments)    Makes her eyes look like she sees strobe lights  . Codeine Other (See Comments)    Confusion and dizziness  . Cyclobenzaprine Other (See Comments)    Adverse reaction - back and throat pain, dizziness, exhaustion  . Cymbalta [Duloxetine Hcl] Other (See Comments)    "body spasms and made me feel weird in my head"   . Influenza Vac Split [Flu Virus Vaccine] Other (See  Comments)    "allergic to concentrated eggs that are in vaccine"  . Prevnar [Pneumococcal 13-Val Conj Vacc] Other (See Comments)    Egg allergy Bad reaction after shot  . Saline Rash    FEVER, BURNING  . Sulfasalazine Other (See Comments)    "makes my whole body smell like sulfa & makes me sick just  smelling it"  . Atorvastatin Other (See Comments)    Severe muscle cramps to generic atorvastatin.  Able to take brand lipitor.  . Chlorhexidine Other (See Comments)    blisters  . Dilaudid [Hydromorphone Hcl] Other (See Comments)    Feels like she is burning internally   . Latex Nausea Only, Rash and Other (See Comments)    Pain and infection  . Morphine And Related Other (See Comments)    Internally feels like she is on fire   . Pravastatin Other (See Comments)    myalgias  . Prevacid [Lansoprazole] Other (See Comments)    Worsened GI side effects  . Red Dye Nausea Only and Swelling  . Rosuvastatin Other (See Comments)    Was not effective controlling lipids  . Simvastatin Other (See Comments)    Leg cramps  . Diflucan [Fluconazole] Nausea And Vomiting  . Other     NOTE: pt is able to take cephalosporins without reaction  . Penicillins Other (See Comments)    CHILDHOOD ALLEGY/REACTION: "told 50 years ago I couldn't take it; might be immune to it", able to take amoxicillin Has patient had a PCN reaction causing immediate rash, facial/tongue/throat swelling, SOB or lightheadedness with hypotension: Unknown Has patient had a PCN reaction causing severe rash involving mucus membranes or skin necrosis: UnknowN Has patient had a PCN reaction that required hospitalization: /Unknown Has patient had a PCN reaction occurring within the last 10 years: Unknown If a  . Sertraline Other (See Comments)    Overly sedating  . Tape Other (See Comments)    All adhesives  - Blisters, itching and burning   . Metaxalone Other (See Comments)    REACTION: unknown    History of Present Illness     72 year old female with the above complex past medical history including nonischemic cardiomyopathy with an EF most recently measured at 29 to 45% in March 2018, prior negative stress test, HFrEF, hypertension, hyperlipidemia, status post ICD placement, and chronic diarrhea.  She was last seen in clinic here in February following some falls with broken ribs.  Since then, she is continued to have some right mid back pain associated with a second fall.  She is chronic dyspnea on exertion and fatigues easily.  Exercise tolerance is poor.  She also experiences intermittent fleeting pain under her ICD as well as occasional midsternal fleeting pain.  She notes dyspnea with minimal activity and says it has been this way for several years.  She has been experiencing chronic diarrhea though she does have intermittent constipation as well.  In that setting, she has been evaluated by GI at [facility] with a plan for  colonoscopy, though she has been told that it would be done under general anesthesia.  In that setting, she needs cardiac clearance.  She denies PND, orthopnea, dizziness, syncope, edema, or early satiety.  In mid May, her diuretic dosing was reduced in the setting of orthostasis and mild prerenal azotemia.  She iIs now back on her usual dose of spironolactone and torsemide and has not had any further issues with orthostasis/lightheadedness.  Home Medications    Prior to Admission medications   Medication Sig Start Date End Date Taking? Authorizing Provider  acetaminophen (TYLENOL) 500 MG tablet Take 500 mg by mouth daily as needed (PAIN).   Yes [provider]  alendronate (FOSAMAX) 70 MG tablet Take 70 mg by mouth once a week. Patient takes every Thursday   Yes [provider]  [place]  MEDICATION Medication Name:  1 squatty potty to use as needed 01/20/16  Yes Nandigam, Venia Minks, MD  Ascorbic Acid (VITAMIN C) 1000 MG tablet Take 1,000 mg by mouth daily.   Yes [provider]  aspirin EC 81 MG tablet Take 81 mg by mouth daily.   Yes [provider]  BIOTIN PO Take 1 tablet by mouth daily.   Yes [provider]  Calcium Carbonate-Vitamin D (CALCIUM + D PO) Take 1 tablet by mouth daily.   Yes [provider]  carvedilol (COREG) 6.25 MG tablet TAKE 1 TABLET (6.25 MG TOTAL) BY MOUTH 2 (TWO) TIMES DAILY WITH A MEAL. 06/13/17  Yes Minna Merritts, MD  cholecalciferol (VITAMIN D) 1000 units tablet Take 2,000 Units by mouth daily.   Yes [provider]  Coenzyme Q10 (CO Q 10 PO) Take 1 tablet by mouth daily.   Yes [provider]  dextromethorphan (DELSYM) 30 MG/5ML liquid Take 15 mg by mouth at bedtime as needed for cough.   Yes [provider]  ezetimibe (ZETIA) 10 MG tablet TAKE 1 TABLET (10 MG TOTAL) BY MOUTH DAILY. 05/14/17  Yes [name], MD  GARLIC PO Take 1 tablet by mouth daily.   Yes [provider]  losartan (COZAAR) 50 MG tablet TAKE 1 TABLET BY MOUTH EVERY DAY 07/09/17  Yes Gollan, Kathlene November, MD  mupirocin ointment (BACTROBAN) 2 % 1(ONE) APPLICATION(S) TOPICAL 2(TWO) TIMES A DAY 07/13/17  Yes [provider]  nystatin cream (MYCOSTATIN) Apply 1 application topically 2 (two) times daily. 06/22/17  Yes [name], MD  potassium chloride (K-DUR) 10 MEQ tablet Take 10 mEq by mouth as directed. Take daily as needed   Yes [provider]  Pyridoxine HCl (VITAMIN B6 PO) Take 1 capsule by mouth daily. Reported on 07/12/2015   Yes [provider]  Red Yeast Rice 600 MG CAPS Take 1 capsule (600 mg total) by mouth 2 (two) times daily. 06/12/16  Yes [name], MD  spironolactone (ALDACTONE) 25 MG tablet TAKE 1 TABLET BY MOUTH DAILY 01/18/17  Yes Gollan, Kathlene November, MD  torsemide (DEMADEX) 10 MG tablet Take 10 mg by mouth daily as needed (swelling/fluid).   Yes [provider]  vitamin E 100 UNIT capsule Take 100 Units by mouth daily.   Yes [provider]  bisoprolol (ZEBETA) 5 MG tablet Take 5 mg by mouth daily. 06/06/11 07/24/11  Minna Merritts, MD    Review of Systems    Chronic dyspnea on exertion and easy fatigability.  Occasional somewhat atypical/fleeting chest pain.  Chronic diarrhea with intermittent constipation.  She denies palpitations, PND, orthopnea, dizziness, syncope, edema, or early satiety.  All other systems reviewed and are otherwise negative except as noted above.  Physical Exam    VS:  BP 124/68 (BP Location: Left Arm, Patient Position: Sitting, Cuff Size: Normal)   Pulse 85   Ht 5' 5.5" (1.664 m)   Wt 173 lb (78.5 kg)   BMI 28.35 kg/m  , BMI Body mass index is 28.35 kg/m. GEN: Well nourished, well developed, in no acute distress.  HEENT: normal.  Neck: Supple, no JVD, carotid bruits, or masses. Cardiac: RRR, no murmurs, rubs, or gallops. No clubbing, cyanosis, edema.  Radials/DP/PT 2+ and equal bilaterally.  Respiratory:  Respirations regular and unlabored, clear to auscultation bilaterally. GI: Soft, nontender, nondistended, BS + x 4. MS: no deformity or atrophy. Skin: warm and dry, no rash. Neuro:  Strength and sensation  are intact. Psych: Normal affect.  Accessory Clinical Findings    ECG - regular sinus rhythm, 85, left axis deviation, left bundle branch block  Assessment & Plan    1.  Nonischemic cardiomyopathy/chronic combined systolic and diastolic congestive heart failure: Patient is euvolemic on exam today.  She did have some prerenal azotemia and orthostasis last month which required adjustment of her daily diuretics.  She is now on her usual dose of torsemide and spironolactone.  She is also on beta-blocker and ARB therapy.  She has chronic dyspnea on exertion with poor exercise tolerance.  She also experiences occasional chest pain.  She is pending colonoscopy under general anesthesia and requires cardiac clearance.  Given poor exercise tolerance and greater than 2 years since last  ischemic evaluation, I will arrange for a Lexiscan Myoview.  2.  Atypical chest pain/perioperative cardiovascular examination: Status post prior negative Myoview's, last in February 2017.  Now pending colonoscopy under general anesthesia by her report.  As above, in the setting of poor exercise tolerance and chronic dyspnea on exertion, I will arrange for ischemic testing.  3.  Essential hypertension: Stable.  4.  Hyperlipidemia: She is intolerant to statins and takes red yeast rice and Zetia.  5.  Prerenal azotemia: In the setting of chronic diarrhea and diuretic therapy.  Diarrhea has improved.  She is now on usual doses of diuretics.  She has not had a follow-up basic metabolic panel since mid May and I will arrange for this today.  6.  Disposition: Follow-up basic metabolic panel and stress testing as above.  Follow-up in clinic in 3 months or sooner if necessary.  Murray Hodgkins, NP 10/15/2017, 5:20 PM

## 2017-10-15 NOTE — Patient Instructions (Addendum)
Medication Instructions:  Your physician recommends that you continue on your current medications as directed. Please refer to the Current Medication list given to you today.   Labwork: Your physician recommends that you return for lab work in: [place] ([facility]).   Testing/Procedures: Your physician has requested that you have a lexiscan myoview. For further information please visit [email or web address]. Please follow instruction sheet, as given.  [place]  Your caregiver has ordered a Stress Test with nuclear imaging. The purpose of this test is to evaluate the blood supply to your heart muscle. This procedure is referred to as a "Non-Invasive Stress Test." This is because other than having an IV started in your vein, nothing is inserted or "invades" your body. Cardiac stress tests are done to find areas of poor blood flow to the heart by determining the extent of coronary artery disease (CAD). Some patients exercise on a treadmill, which naturally increases the blood flow to your heart, while others who are  unable to walk on a treadmill due to physical limitations have a pharmacologic/chemical stress agent called Lexiscan . This medicine will mimic walking on a treadmill by temporarily increasing your coronary blood flow.   Please note: these test may take anywhere between 2-4 hours to complete  PLEASE REPORT TO [facility] AT THE FIRST DESK WILL DIRECT YOU WHERE TO GO  Date of Procedure:_____________________________________  Arrival Time for Procedure:______________________________   PLEASE NOTIFY THE OFFICE AT LEAST 24 HOURS IN ADVANCE IF YOU ARE UNABLE TO KEEP YOUR APPOINTMENT.  [phone] AND  PLEASE NOTIFY NUCLEAR MEDICINE AT [facility] AT LEAST 24 HOURS IN ADVANCE IF YOU ARE UNABLE TO KEEP YOUR APPOINTMENT. [phone]  How to prepare for your Myoview test:  1. Do not eat or drink after midnight 2. No caffeine for 24 hours prior to  test 3. No smoking 24 hours prior to test. 4. Your medication may be taken with water.  If your doctor stopped a medication because of this test, do not take that medication. 5. Ladies, please do not wear dresses.  Skirts or pants are appropriate. Please wear a short sleeve shirt. 6. No perfume, cologne or lotion. 7. Wear comfortable walking shoes. No heels!    Follow-Up: Your physician recommends that you schedule a follow-up appointment in: [place].   If you need a refill on your cardiac medications before your next appointment, please call your pharmacy.    Cardiac Nuclear Scan A cardiac nuclear scan is a test that measures blood flow to the heart when a person is resting and when he or she is exercising. The test looks for problems such as:  Not enough blood reaching a portion of the heart.  The heart muscle not working normally.  You may need this test if:  You have heart disease.  You have had abnormal lab results.  You have had heart surgery or angioplasty.  You have chest pain.  You have shortness of breath.  In this test, a radioactive dye (tracer) is injected into your bloodstream. After the tracer has traveled to your heart, an imaging device is used to measure how much of the tracer is absorbed by or distributed to various areas of your heart. This procedure is usually done at a hospital and takes 2-4 hours. Tell a health care provider about:  Any allergies you have.  All medicines you are taking, including vitamins, herbs, eye drops, creams, and over-the-counter medicines.  Any problems you  or family members have had with the use of anesthetic medicines.  Any blood disorders you have.  Any surgeries you have had.  Any medical conditions you have.  Whether you are pregnant or may be pregnant. What are the risks? Generally, this is a safe procedure. However, problems may occur, including:  Serious chest pain and heart attack. This is  only a risk if the stress portion of the test is done.  Rapid heartbeat.  Sensation of warmth in your chest. This usually passes quickly.  What happens before the procedure?  Ask your health care provider about changing or stopping your regular medicines. This is especially important if you are taking diabetes medicines or blood thinners.  Remove your jewelry on the day of the procedure. What happens during the procedure?  An IV tube will be inserted into one of your veins.  Your health care provider will inject a small amount of radioactive tracer through the tube.  You will wait for 20-40 minutes while the tracer travels through your bloodstream.  Your heart activity will be monitored with an electrocardiogram (ECG).  You will lie down on an exam table.  Images of your heart will be taken for about 15-20 minutes.  You may be asked to exercise on a treadmill or stationary bike. While you exercise, your heart's activity will be monitored with an ECG, and your blood pressure will be checked. If you are unable to exercise, you may be given a medicine to increase blood flow to parts of your heart.  When blood flow to your heart has peaked, a tracer will again be injected through the IV tube.  After 20-40 minutes, you will get back on the exam table and have more images taken of your heart.  When the procedure is over, your IV tube will be removed. The procedure may vary among health care providers and hospitals. Depending on the type of tracer used, scans may need to be repeated 3-4 hours later. What happens after the procedure?  Unless your health care provider tells you otherwise, you may return to your normal schedule, including diet, activities, and medicines.  Unless your health care provider tells you otherwise, you may increase your fluid intake. This will help flush the contrast dye from your body. Drink enough fluid to keep your urine clear or pale yellow.  It is up to  you to get your test results. Ask your health care provider, or the department that is doing the test, when your results will be ready. Summary  A cardiac nuclear scan measures the blood flow to the heart when a person is resting and when he or she is exercising.  You may need this test if you are at risk for heart disease.  Tell your health care provider if you are pregnant.  Unless your health care provider tells you otherwise, increase your fluid intake. This will help flush the contrast dye from your body. Drink enough fluid to keep your urine clear or pale yellow. This information is not intended to replace advice given to you by your health care provider. Make sure you discuss any questions you have with your health care provider. Document Released: 05/26/2004 Document Revised: 05/03/2016 Document Reviewed: 04/09/2013 Elsevier Interactive Patient Education  2017 [employer].

## 2017-10-16 LAB — BASIC METABOLIC PANEL
BUN/Creatinine Ratio: 27 (ref 12–28)
BUN: 28 mg/dL — ABNORMAL HIGH (ref 8–27)
CO2: 19 mmol/L — ABNORMAL LOW (ref 20–29)
Calcium: 9.9 mg/dL (ref 8.7–10.3)
Chloride: 103 mmol/L (ref 96–106)
Creatinine, Ser: 1.03 mg/dL — ABNORMAL HIGH (ref 0.57–1.00)
GFR calc Af Amer: 63 mL/min/{1.73_m2} (ref >59)
GFR calc non Af Amer: 54 mL/min/{1.73_m2} — ABNORMAL LOW (ref >59)
Glucose: 98 mg/dL (ref 65–99)
Potassium: 4.6 mmol/L (ref 3.5–5.2)
Sodium: 142 mmol/L (ref 134–144)

## 2017-10-17 ENCOUNTER — Other Ambulatory Visit (INDEPENDENT_AMBULATORY_CARE_PROVIDER_SITE_OTHER): Payer: Medicare Other | Admitting: *Deleted

## 2017-10-17 DIAGNOSIS — G8928 Other chronic postprocedural pain: Secondary | ICD-10-CM | POA: Diagnosis not present

## 2017-10-17 DIAGNOSIS — I420 Dilated cardiomyopathy: Secondary | ICD-10-CM | POA: Diagnosis not present

## 2017-10-17 DIAGNOSIS — K582 Mixed irritable bowel syndrome: Secondary | ICD-10-CM | POA: Diagnosis not present

## 2017-10-17 DIAGNOSIS — I10 Essential (primary) hypertension: Secondary | ICD-10-CM

## 2017-10-18 ENCOUNTER — Encounter
Admission: RE | Admit: 2017-10-18 | Discharge: 2017-10-18 | Disposition: A | Discharge status: Home / Self Care | Payer: Medicare Other | Source: Ambulatory Visit | Attending: Nurse Practitioner | Admitting: Nurse Practitioner

## 2017-10-18 DIAGNOSIS — I5042 Chronic combined systolic (congestive) and diastolic (congestive) heart failure: Secondary | ICD-10-CM | POA: Diagnosis not present

## 2017-10-18 MED ORDER — TECHNETIUM TC 99M TETROFOSMIN IV KIT
30.4070 | PACK | Freq: Once | INTRAVENOUS | Status: AC | PRN
  Administered 2017-10-18: 30.407 via INTRAVENOUS

## 2017-10-18 MED ORDER — TECHNETIUM TC 99M TETROFOSMIN IV KIT
12.7000 | PACK | Freq: Once | INTRAVENOUS | Status: AC | PRN
  Administered 2017-10-18: 12.7 via INTRAVENOUS

## 2017-10-18 MED ORDER — REGADENOSON 0.4 MG/5ML IV SOLN
0.4000 mg | Freq: Once | INTRAVENOUS | Status: AC
  Administered 2017-10-18: 0.4 mg via INTRAVENOUS

## 2017-10-19 ENCOUNTER — Encounter: Payer: Self-pay | Admitting: Nurse Practitioner

## 2017-10-19 LAB — NM MYOCAR MULTI W/SPECT W/WALL MOTION / EF
LV dias vol: 117 mL (ref 46–106)
LV sys vol: 51 mL
Peak HR: 106 {beats}/min
Percent HR: 71 %
Rest HR: 72 {beats}/min
SDS: 2
SRS: 2
SSS: 2
TID: 1.05

## 2017-10-30 ENCOUNTER — Ambulatory Visit: Payer: Medicare Other

## 2017-11-06 ENCOUNTER — Other Ambulatory Visit: Payer: Self-pay | Admitting: Family Medicine

## 2017-11-06 DIAGNOSIS — E559 Vitamin D deficiency, unspecified: Secondary | ICD-10-CM

## 2017-11-06 DIAGNOSIS — N183 Chronic kidney disease, stage 3 unspecified: Secondary | ICD-10-CM

## 2017-11-06 DIAGNOSIS — D803 Selective deficiency of immunoglobulin G [IgG] subclasses: Secondary | ICD-10-CM

## 2017-11-06 DIAGNOSIS — E785 Hyperlipidemia, unspecified: Secondary | ICD-10-CM

## 2017-11-07 ENCOUNTER — Ambulatory Visit: Payer: Medicare Other

## 2017-11-09 ENCOUNTER — Ambulatory Visit (INDEPENDENT_AMBULATORY_CARE_PROVIDER_SITE_OTHER): Payer: Medicare Other

## 2017-11-09 VITALS — BP 112/64 | HR 85 | Temp 98.4°F | Ht 64.5 in | Wt 177.2 lb

## 2017-11-09 DIAGNOSIS — E559 Vitamin D deficiency, unspecified: Secondary | ICD-10-CM | POA: Diagnosis not present

## 2017-11-09 DIAGNOSIS — N183 Chronic kidney disease, stage 3 unspecified: Secondary | ICD-10-CM

## 2017-11-09 DIAGNOSIS — D803 Selective deficiency of immunoglobulin G [IgG] subclasses: Secondary | ICD-10-CM | POA: Diagnosis not present

## 2017-11-09 DIAGNOSIS — E785 Hyperlipidemia, unspecified: Secondary | ICD-10-CM

## 2017-11-09 DIAGNOSIS — Z0000 Encounter for general adult medical examination without abnormal findings: Secondary | ICD-10-CM

## 2017-11-09 LAB — CBC WITH DIFFERENTIAL/PLATELET
Basophils Absolute: 0.1 10*3/uL (ref 0.0–0.1)
Basophils Relative: 1.3 % (ref 0.0–3.0)
Eosinophils Absolute: 0.1 10*3/uL (ref 0.0–0.7)
Eosinophils Relative: 1.5 % (ref 0.0–5.0)
HCT: 39.2 % (ref 36.0–46.0)
Hemoglobin: 12.9 g/dL (ref 12.0–15.0)
Lymphocytes Relative: 25 % (ref 12.0–46.0)
Lymphs Abs: 2.4 10*3/uL (ref 0.7–4.0)
MCHC: 32.9 g/dL (ref 30.0–36.0)
MCV: 90.3 fl (ref 78.0–100.0)
Monocytes Absolute: 0.8 10*3/uL (ref 0.1–1.0)
Monocytes Relative: 8.3 % (ref 3.0–12.0)
Neutro Abs: 6.1 10*3/uL (ref 1.4–7.7)
Neutrophils Relative %: 63.9 % (ref 43.0–77.0)
Platelets: 288 10*3/uL (ref 150.0–400.0)
RBC: 4.34 Mil/uL (ref 3.87–5.11)
RDW: 13.1 % (ref 11.5–15.5)
WBC: 9.6 10*3/uL (ref 4.0–10.5)

## 2017-11-09 LAB — COMPREHENSIVE METABOLIC PANEL
ALT: 19 U/L (ref 0–35)
AST: 20 U/L (ref 0–37)
Albumin: 4.7 g/dL (ref 3.5–5.2)
Alkaline Phosphatase: 101 U/L (ref 39–117)
BUN: 25 mg/dL — ABNORMAL HIGH (ref 6–23)
CO2: 24 mEq/L (ref 19–32)
Calcium: 10.1 mg/dL (ref 8.4–10.5)
Chloride: 105 mEq/L (ref 96–112)
Creatinine, Ser: 1.05 mg/dL (ref 0.40–1.20)
GFR: 54.71 mL/min — ABNORMAL LOW (ref >60.00)
Glucose, Bld: 102 mg/dL — ABNORMAL HIGH (ref 70–99)
Potassium: 4.7 mEq/L (ref 3.5–5.1)
Sodium: 140 mEq/L (ref 135–145)
Total Bilirubin: 0.4 mg/dL (ref 0.2–1.2)
Total Protein: 8.2 g/dL (ref 6.0–8.3)

## 2017-11-09 LAB — LIPID PANEL
Cholesterol: 197 mg/dL (ref 0–200)
HDL: 45.3 mg/dL (ref >39.00)
NonHDL: 152.1
Total CHOL/HDL Ratio: 4
Triglycerides: 223 mg/dL — ABNORMAL HIGH (ref 0.0–149.0)
VLDL: 44.6 mg/dL — ABNORMAL HIGH (ref 0.0–40.0)

## 2017-11-09 LAB — TSH: TSH: 1.64 u[IU]/mL (ref 0.35–4.50)

## 2017-11-09 LAB — VITAMIN D 25 HYDROXY (VIT D DEFICIENCY, FRACTURES): VITD: 30.54 ng/mL (ref 30.00–100.00)

## 2017-11-09 LAB — LDL CHOLESTEROL, DIRECT: Direct LDL: 124 mg/dL

## 2017-11-09 NOTE — Patient Instructions (Signed)
Ms. Janowski , Thank you for taking time to come for your Medicare Wellness Visit. I appreciate your ongoing commitment to your health goals. Please review the following plan we discussed and let me know if I can assist you in the future.   These are the goals we discussed: Goals    . Patient Stated     Starting 11/09/2017, I will continue to take medications as prescribed.        This is a list of the screening recommended for you and due dates:  Health Maintenance  Topic Date Due  . Colon Cancer Screening  01/03/2024*  . DTaP/Tdap/Td vaccine (1 - Tdap) 11/11/2025*  . Tetanus Vaccine  11/11/2025*  . Flu Shot  12/13/2017  . Mammogram  02/11/2018  . DEXA scan (bone density measurement)  Completed  .  Hepatitis C: One time screening is recommended by Center for Disease Control  (CDC) for  adults born from [birth year] through 1965.   Completed  . Pneumonia vaccines  Completed  *Topic was postponed. The date shown is not the original due date.   Preventive Care for Adults  A healthy lifestyle and preventive care can promote health and wellness. Preventive health guidelines for adults include the following key practices.  . A routine yearly physical is a good way to check with your health care provider about your health and preventive screening. It is a chance to share any concerns and updates on your health and to receive a thorough exam.  . Visit your dentist for a routine exam and preventive care every 6 months. Brush your teeth twice a day and floss once a day. Good oral hygiene prevents tooth decay and gum disease.  . The frequency of eye exams is based on your age, health, family medical history, use  of contact lenses, and other factors. Follow your health care provider's recommendations for frequency of eye exams.  . Eat a healthy diet. Foods like vegetables, fruits, whole grains, low-fat dairy products, and lean protein foods contain the nutrients you need without too many calories.  Decrease your intake of foods high in solid fats, added sugars, and salt. Eat the right amount of calories for you. Get information about a proper diet from your health care provider, if necessary.  . Regular physical exercise is one of the most important things you can do for your health. Most adults should get at least 150 minutes of moderate-intensity exercise (any activity that increases your heart rate and causes you to sweat) each week. In addition, most adults need muscle-strengthening exercises on 2 or more days a week.  Silver Sneakers may be a benefit available to you. To determine eligibility, you may visit the website: www.silversneakers.com or contact program at 615-697-9558 Mon-Fri between 8AM-8PM.   . Maintain a healthy weight. The body mass index (BMI) is a screening tool to identify possible weight problems. It provides an estimate of body fat based on height and weight. Your health care provider can find your BMI and can help you achieve or maintain a healthy weight.   For adults 20 years and older: ? A BMI below 18.5 is considered underweight. ? A BMI of 18.5 to 24.9 is normal. ? A BMI of 25 to 29.9 is considered overweight. ? A BMI of 30 and above is considered obese.   . Maintain normal blood lipids and cholesterol levels by exercising and minimizing your intake of saturated fat. Eat a balanced diet with plenty of fruit and vegetables. Blood  tests for lipids and cholesterol should begin at age 18 and be repeated every 5 years. If your lipid or cholesterol levels are high, you are over 50, or you are at high risk for heart disease, you may need your cholesterol levels checked more frequently. Ongoing high lipid and cholesterol levels should be treated with medicines if diet and exercise are not working.  . If you smoke, find out from your health care provider how to quit. If you do not use tobacco, please do not start.  . If you choose to drink alcohol, please do not consume  more than 2 drinks per day. One drink is considered to be 12 ounces (355 mL) of beer, 5 ounces (148 mL) of wine, or 1.5 ounces (44 mL) of liquor.  . If you are 74-55 years old, ask your health care provider if you should take aspirin to prevent strokes.  . Use sunscreen. Apply sunscreen liberally and repeatedly throughout the day. You should seek shade when your shadow is shorter than you. Protect yourself by wearing long sleeves, pants, a wide-brimmed hat, and sunglasses year round, whenever you are outdoors.  . Once a month, do a whole body skin exam, using a mirror to look at the skin on your back. Tell your health care provider of new moles, moles that have irregular borders, moles that are larger than a pencil eraser, or moles that have changed in shape or color.

## 2017-11-09 NOTE — Progress Notes (Signed)
PCP notes:   Health maintenance:  No gaps identified.   Abnormal screenings:   Hearing - failed  Hearing Screening   125Hz  250Hz  500Hz  1000Hz  2000Hz  3000Hz  4000Hz  6000Hz  8000Hz   Right ear:   40 40 40  0    Left ear:   40 40 40  40     Fall risk - hx of multiple falls Fall Risk  11/09/2017 05/23/2016 06/08/2015  Falls in the past year? Yes Yes Yes  Comment - pt has had multiple accidental falls without injury -  Number falls in past yr: 2 or more 2 or more 2 or more  Injury with Fall? Yes No Yes  Risk Factor Category  High Fall Risk - -  Risk for fall due to : Impaired balance/gait Impaired vision;History of fall(s);Impaired balance/gait -   Depression score: 7 Depression screen [facility] 2/9 11/09/2017 05/23/2016 06/08/2015  Decreased Interest 0 0 0  Down, Depressed, Hopeless 1 0 0  PHQ - 2 Score 1 0 0  Altered sleeping 2 - -  Tired, decreased energy 3 - -  Change in appetite 1 - -  Feeling bad or failure about yourself  0 - -  Trouble concentrating 0 - -  Moving slowly or fidgety/restless 0 - -  Suicidal thoughts 0 - -  PHQ-9 Score 7 - -  Difficult doing work/chores Very difficult - -  Some recent data might be hidden   Patient concerns:   A. Pain in right upper torso related to fall. States "it almost knocks the breath out of me sometimes".  B. Bending over causes patient to "lose strength and feel sick or in pain".   Nurse concerns:  None  Next PCP appt:   11/19/17 @ 1400

## 2017-11-09 NOTE — Progress Notes (Signed)
Subjective:   Ashley [name] is a 72 y.o. female who presents for Medicare Annual (Subsequent) preventive examination.  Review of Systems:  N/A Cardiac Risk Factors include: advanced age (>14men, >96 women);hypertension     Objective:     Vitals: BP 112/64 (BP Location: Right Arm, Patient Position: Sitting, Cuff Size: Normal)   Pulse 85   Temp 98.4 F (36.9 C) (Oral)   Ht 5' 4.5" (1.638 m) Comment: no shoes  Wt 177 lb 4 oz (80.4 kg)   SpO2 95%   BMI 29.96 kg/m   Body mass index is 29.96 kg/m.  Advanced Directives 11/09/2017 06/18/2017 02/21/2017 11/16/2016 10/27/2016 05/23/2016 02/21/2016  Does Patient Have a Medical Advance Directive? Yes No Yes Yes No Yes Yes  Type of Paramedic of [place];Living will - - [place];Living will - [place];Living will Living will  Does patient want to make changes to medical advance directive? - - - - - - No - Patient declined  Copy of [place] in Chart? No - copy requested - - No - copy requested - No - copy requested Yes  Would patient like information on creating a medical advance directive? - No - Patient declined - No - Patient declined No - Patient declined - -  Pre-existing out of facility DNR order (yellow form or pink MOST form) - - - - - - -    Tobacco Social History   Tobacco Use  Smoking Status Never Smoker  Smokeless Tobacco Never Used  Tobacco Comment   Lived with smokers x 23 yrs, smoked herself "for a week"     Counseling given: No Comment: Lived with smokers x 23 yrs, smoked herself "for a week"   Clinical Intake:  Pre-visit preparation completed: Yes  Pain Score: 3 (back)     Nutritional Status: BMI > 30  Obese Nutritional Risks: None Diabetes: No  How often do you need to have someone help you when you read instructions, pamphlets, or other written materials from your doctor or pharmacy?: 1 - Never What is the last grade  level you completed in school?: 12th grade + [employer]  Interpreter Needed?: No  Comments: pt lives with spouse Information entered by :: LPinson, LPN  Past Medical History:  Diagnosis Date  . Abdominal aortic atherosclerosis ([place]) 11/2015   by CT  . Allergy   . Anemia   . Anxiety   . Automatic implantable cardioverter-defibrillator in situ 2012   a. MDT single lead ICD.  Marland Kitchen Bronchiectasis    a. possibly mild, treat URIs aggressively  . Cancer (HCC)    basal cell, arms & neck  . Chest pain    a. 06/2015 MV: large defect of mod severity in mid ant, mid antsept, apical ant, and apical location. Prior ant MI w/ peri-inf ischemia. Suboptimal due to GI uptake and LBBB. EF 37%.  . Chronic sinusitis   . Chronic systolic CHF (congestive heart failure) (HCC)    a. EF 25-35%, LV dilation, significant MR, goal weight 150lbs;  b. 04/2014 Echo: EF 50-55%, Gr 1 DD; c. 03/2015 Echo: EF 50-55%, Gr1 DD; d. 06/2015 Echo: EF 30-35%, Gr1 DD; e. 07/2016 Echo: EF 40-45%, diff HK, Gr1 DD, nl PASP.  Marland Kitchen Depression   . Diarrhea 07/14/2008   Qualifier: Diagnosis of  By: Glori Bickers MD, Carmell Austria   . Disorder of vocal cords   . Essential hypertension   . Fibrocystic breast disease   .  Fibromyalgia   . GERD (gastroesophageal reflux disease)   . H/O multiple allergies 10/10/2013  . Hemorrhoids   . History of diverticulitis of colon   . History of pneumonia 2012  . Insomnia   . Irritable bowel syndrome   . Laryngeal nodule    Dr. Thomasena Edis  . Lung disease 07/14/11  . Migraine without aura, without mention of intractable migraine without mention of status migrainosus   . Mitral regurgitation   . NICM (nonischemic cardiomyopathy) ([place])   . Nonischemic cardiomyopathy ([place])    a. Recovered LV fxn - prev as low as 25%, now 50-55% by echo 04/2014;  b. 2012 s/p MDT [id number] [place] VR single lead ICD.  Marland Kitchen Osteoarthritis    knees, back, hands, low back,   . Osteoporosis 01/2013   T-2.7 spine (01/2016)  . Pneumonia 2011    . PONV (postoperative nausea and vomiting)   . Pre-syncope   . Pure hypercholesterolemia   . Sigmoid diverticulitis with abscess, perforation and fistula formation 09/25/2013  . sigmoid diverticulitis with perforation, abscess and fistula 09/30/2013  . Sleep apnea 2010   borderline, inconclusive- /w [place]    Past Surgical History:  Procedure Laterality Date  . ABDOMINAL HYSTERECTOMY  1987   w/BSO  . ANAL RECTAL MANOMETRY N/A 02/02/2016   Procedure: ANO RECTAL MANOMETRY;  Surgeon: Mauri Pole, MD;  Location: [facility];  Service: Endoscopy;  Laterality: N/A;  . APPENDECTOMY    . AUGMENTATION MAMMAPLASTY    . BREAST BIOPSY     "I've had 2-3 bx; all benign"  . breast cystectomies     due to FCBD  . BREAST MASS EXCISION  01/2010   Left-fibrocystic change w/intraductal papilloma, no malignancy  . carotid US  38/1829   9-37% LICA, normal RICA  . [place] Bunionectomy  11/04/08   Right Great Toe (Dr. Beola Cord)  . COLONOSCOPY  1998   nml (sigmoid-Gilbert-nol)  . Colonoscopy  01/04/04   polyps, diverticulosis/severe  . COLOSTOMY N/A 10/02/2013   DESCENDING COLOSTOMY;  Surgeon: Imogene Burn. Tsuei, MD  . Derrill Memo REVERSAL N/A 02/10/2014   [name], MD  . CT chest  12/2010   possible bronchiectasis lower lobes, 02/2011 - enlarged bilateral effusions, bibasilar atx  . CT chest  04/2011   no PE.  abnormal R hilar and mediastinal adenopathy --> on rpt 05/2011, resolved  . DEXA  03/28/99   osteopenia L/S  . DEXA  03/12/03   2.2 Spine 0.1 Hip  Osteopenia  . DEXA  11/14/05   1.9 Spine 0.3 Hip  slight improvement  . DEXA  11/22/06   improved osteo and fem neck  . DEXA  01/2013   T score -2.6 at spine  . EMG/MCV  10/16/01   + mild carpal tunnel  . ESOPHAGOGASTRODUODENOSCOPY  09/2014   small HH, irregular Z line, fundic gland polyps with mild gastritis ([place])  . Flex laryngoscopy  06/11/06   ([place]) nml  . Head MRI, Head CT  10/1998   ? H/A focus ([place])  . ICD placement   07/14/11   w/pacemaker  . IMPLANTABLE CARDIOVERTER DEFIBRILLATOR IMPLANT N/A 07/14/2011   IMPLANTABLE CARDIOVERTER DEFIBRILLATOR IMPLANT;  Surgeon: Evans Lance, MD;  Location: [facility] CATH LAB;  Service: Cardiovascular;  Laterality: N/A;  . KNEE ARTHROSCOPY W/ ORIF     Left; "meniscus tear"  . KNEE SURGERY     left  . L/S films  11/21/01   nml; Right hip nml  . LASIK  left eye  . MANDIBLE SURGERY     "got about 6 pins in the bottom of my jaw"  . MANDIBLE SURGERY    . MRI Right Hip  12/12/01   Tendonitis Gluteus Medius to Gtr Trochanter Neg Bursitis (Dr. Eulas Post)  . MRI U/S  11/21/01   min. DDD L/3-4  . NCS  11/2014   L ulnar neuropathy, mild B median nerve entrapment ([place])  . OSTEOTOMY     Left foot  . PARTIAL COLECTOMY N/A 10/02/2013   PARTIAL COLECTOMY;  Surgeon: Imogene Burn. Tsuei, MD  . sinus surgery     x3 with balloon   . stress myoview  02/2011   no significant ischemia  . TOE AMPUTATION     "toe beside baby toe on left foot; got gangrene from corn"  . TONSILLECTOMY AND ADENOIDECTOMY    . US ECHOCARDIOGRAPHY  04/2011   mildly dilated LV, EF 25-30%, hypokinesis, restrictive physiology, mod MR, no AS   Family History  Problem Relation Age of Onset  . Lung cancer Father        + smoker  . Colon polyps Father   . Dementia Mother   . Osteoporosis Mother        Lumbar spine  . Stroke Mother        x 5 @ 22 YOA  . Arthritis Mother        hands  . Emphysema Mother   . Alcohol abuse Paternal Grandfather   . Stroke Paternal Grandfather        ?  Marland Kitchen Heart disease Paternal Grandfather   . Hypertension Maternal Grandfather        ?  Marland Kitchen Heart disease Paternal Grandmother   . Colon cancer Neg Hx   . Esophageal cancer Neg Hx   . Gallbladder disease Neg Hx    Social History   Socioeconomic History  . Marital status: Married    Spouse name: Not on file  . Number of children: 1  . Years of education: Not on file  . Highest education level: Not on file  Occupational History   . Occupation: Oncologist  . Occupation: [employer]  Social Needs  . Financial resource strain: Not on file  . Food insecurity:    Worry: Not on file    Inability: Not on file  . Transportation needs:    Medical: Not on file    Non-medical: Not on file  Tobacco Use  . Smoking status: Never Smoker  . Smokeless tobacco: Never Used  . Tobacco comment: Lived with smokers x 23 yrs, smoked herself "for a week"  Substance and Sexual Activity  . Alcohol use: No    Alcohol/week: 0.0 oz  . Drug use: No  . Sexual activity: Not Currently  Lifestyle  . Physical activity:    Days per week: Not on file    Minutes per session: Not on file  . Stress: Not on file  Relationships  . Social connections:    Talks on phone: Not on file    Gets together: Not on file    Attends religious service: Not on file    Active member of club or organization: Not on file    Attends meetings of clubs or organizations: Not on file    Relationship status: Not on file  Other Topics Concern  . Not on file  Social History Narrative   Lives with husband Herbie [place]   1 adopted child  Outpatient Encounter Medications as of 11/09/2017  Medication Sig  . acetaminophen (TYLENOL) 500 MG tablet Take 500 mg by mouth daily as needed (PAIN).  Marland Kitchen alendronate (FOSAMAX) 70 MG tablet Take 70 mg by mouth once a week. Patient takes every Thursday  . AMBULATORY NON FORMULARY MEDICATION Medication Name:  1 squatty potty to use as needed  . Ascorbic Acid (VITAMIN C) 1000 MG tablet Take 1,000 mg by mouth daily.  Marland Kitchen aspirin EC 81 MG tablet Take 81 mg by mouth daily.  Marland Kitchen BIOTIN PO Take 1 tablet by mouth daily.  . Calcium Carbonate-Vitamin D (CALCIUM + D PO) Take 1 tablet by mouth daily.  . carvedilol (COREG) 6.25 MG tablet TAKE 1 TABLET (6.25 MG TOTAL) BY MOUTH 2 (TWO) TIMES DAILY WITH A MEAL.  . cholecalciferol (VITAMIN D) 1000 units tablet Take 2,000 Units by mouth daily.  . Coenzyme Q10  (CO Q 10 PO) Take 1 tablet by mouth daily.  Marland Kitchen dextromethorphan (DELSYM) 30 MG/5ML liquid Take 15 mg by mouth at bedtime as needed for cough.  . ezetimibe (ZETIA) 10 MG tablet TAKE 1 TABLET (10 MG TOTAL) BY MOUTH DAILY.  Marland Kitchen GARLIC PO Take 1 tablet by mouth daily.  Marland Kitchen losartan (COZAAR) 50 MG tablet TAKE 1 TABLET BY MOUTH EVERY DAY  . nystatin cream (MYCOSTATIN) Apply 1 application topically 2 (two) times daily.  . potassium chloride (K-DUR) 10 MEQ tablet Take 10 mEq by mouth as directed. Take daily as needed  . Pyridoxine HCl (VITAMIN B6 PO) Take 1 capsule by mouth daily. Reported on 07/12/2015  . Red Yeast Rice 600 MG CAPS Take 1 capsule (600 mg total) by mouth 2 (two) times daily.  Marland Kitchen spironolactone (ALDACTONE) 25 MG tablet TAKE 1 TABLET BY MOUTH DAILY  . torsemide (DEMADEX) 10 MG tablet Take 10 mg by mouth daily as needed (swelling/fluid).  . vitamin E 100 UNIT capsule Take 100 Units by mouth daily.  . [DISCONTINUED] bisoprolol (ZEBETA) 5 MG tablet Take 5 mg by mouth daily.  . [DISCONTINUED] mupirocin ointment (BACTROBAN) 2 % 1(ONE) APPLICATION(S) TOPICAL 2(TWO) TIMES A DAY   No facility-administered encounter medications on file as of 11/09/2017.     Activities of Daily Living In your present state of health, do you have any difficulty performing the following activities: 11/09/2017  Hearing? Y  Comment ringing in both ears  Vision? Y  Comment blurred vision in left eye  Difficulty concentrating or making decisions? Y  Walking or climbing stairs? Y  Dressing or bathing? N  Doing errands, shopping? N  Preparing Food and eating ? N  Using the Toilet? N  In the past six months, have you accidently leaked urine? N  Do you have problems with loss of bowel control? Y  Managing your Medications? N  Managing your Finances? N  Housekeeping or managing your Housekeeping? Y  Some recent data might be hidden    Patient Care Team: [name], MD as PCP - General [name] Kathlene November, MD as  PCP - Cardiology (Cardiology) [name], MD as Referring Physician (Family Medicine) Lafayette Dragon, MD (Inactive) as Consulting Physician (Gastroenterology) Evans Lance, MD as Consulting Physician (Cardiology) Minna Merritts, MD as Consulting Physician (Cardiology)    Assessment:   This is a routine wellness examination for [place].   Hearing Screening   125Hz  250Hz  500Hz  1000Hz  2000Hz  3000Hz  4000Hz  6000Hz  8000Hz   Right ear:   40 40 40  0    Left ear:   40  40 40  40    Vision Screening Comments: Vision exam in 2018 @ [place] and Dietary recommendations Exercise limited by: cardiac condition(s);Other - see comments(stomach issues)  Goals    . Patient Stated     Starting 11/09/2017, I will continue to take medications as prescribed.        Fall Risk Fall Risk  11/09/2017 05/23/2016 06/08/2015  Falls in the past year? Yes Yes Yes  Comment - pt has had multiple accidental falls without injury -  Number falls in past yr: 2 or more 2 or more 2 or more  Injury with Fall? Yes No Yes  Risk Factor Category  High Fall Risk - -  Risk for fall due to : Impaired balance/gait Impaired vision;History of fall(s);Impaired balance/gait -   Depression Screen PHQ 2/9 Scores 11/09/2017 05/23/2016 06/08/2015  PHQ - 2 Score 1 0 0  PHQ- 9 Score 7 - -     Cognitive Function MMSE - Mini Mental State Exam 11/09/2017 05/23/2016  Orientation to time 5 5  Orientation to Place 5 5  Registration 3 3  Attention/ Calculation 0 0  Recall 3 3  Language- name 2 objects 0 0  Language- repeat 1 1  Language- follow 3 step command 3 3  Language- read & follow direction 0 0  Write a sentence 0 0  Copy design 0 0  Total score 20 20     PLEASE NOTE: A Mini-Cog screen was completed. Maximum score is 20. A value of 0 denotes this part of [place] MMSE was not completed or the patient failed this part of the Mini-Cog screening.   Mini-Cog Screening Orientation to  Time - Max 5 pts Orientation to Place - Max 5 pts Registration - Max 3 pts Recall - Max 3 pts Language Repeat - Max 1 pts Language Follow 3 Step Command - Max 3 pts     Immunization History  Administered Date(s) Administered  . Pneumococcal Conjugate-13 06/08/2015  . Pneumococcal Polysaccharide-23 02/16/2012  . Td 11/12/2005    Screening Tests Health Maintenance  Topic Date Due  . COLONOSCOPY  01/03/2024 (Originally 01/03/2014)  . DTaP/Tdap/Td (1 - Tdap) 11/11/2025 (Originally 11/13/2005)  . TETANUS/TDAP  11/11/2025 (Originally 11/13/2015)  . INFLUENZA VACCINE  12/13/2017  . MAMMOGRAM  02/11/2018  . DEXA SCAN  Completed  . Hepatitis C Screening  Completed  . PNA vac Low Risk Adult  Completed      Plan:     I have personally reviewed, addressed, and noted the following in the patient's chart:  A. Medical and social history B. Use of alcohol, tobacco or illicit drugs  C. Current medications and supplements D. Functional ability and status E.  Nutritional status F.  Physical activity G. Advance directives H. List of other physicians I.  Hospitalizations, surgeries, and ER visits in previous 12 months J.  [place] to include hearing, vision, cognitive, depression L. Referrals and appointments - none  In addition, I have reviewed and discussed with patient certain preventive protocols, quality metrics, and best practice recommendations. A written personalized care plan for preventive services as well as general preventive health recommendations were provided to patient.  See attached scanned questionnaire for additional information.   Signed,   Lindell Noe, MHA, BS, LPN Health Coach

## 2017-11-13 ENCOUNTER — Other Ambulatory Visit: Payer: Self-pay | Admitting: Cardiovascular Disease

## 2017-11-13 ENCOUNTER — Other Ambulatory Visit: Payer: Self-pay | Admitting: Family Medicine

## 2017-11-16 ENCOUNTER — Ambulatory Visit (INDEPENDENT_AMBULATORY_CARE_PROVIDER_SITE_OTHER): Payer: Medicare Other | Admitting: *Deleted

## 2017-11-16 DIAGNOSIS — I428 Other cardiomyopathies: Secondary | ICD-10-CM | POA: Diagnosis not present

## 2017-11-16 DIAGNOSIS — I5022 Chronic systolic (congestive) heart failure: Secondary | ICD-10-CM

## 2017-11-16 NOTE — Progress Notes (Signed)
Remote ICD transmission.   

## 2017-11-18 NOTE — Progress Notes (Signed)
I reviewed health advisor's note, was available for consultation, and agree with documentation and plan.  

## 2017-11-19 ENCOUNTER — Ambulatory Visit (INDEPENDENT_AMBULATORY_CARE_PROVIDER_SITE_OTHER): Payer: Medicare Other | Admitting: Family Medicine

## 2017-11-19 ENCOUNTER — Encounter: Payer: Self-pay | Admitting: Family Medicine

## 2017-11-19 VITALS — BP 122/62 | HR 84 | Temp 97.9°F | Ht 64.5 in | Wt 181.5 lb

## 2017-11-19 DIAGNOSIS — R0781 Pleurodynia: Secondary | ICD-10-CM

## 2017-11-19 DIAGNOSIS — E559 Vitamin D deficiency, unspecified: Secondary | ICD-10-CM

## 2017-11-19 DIAGNOSIS — Z7189 Other specified counseling: Secondary | ICD-10-CM

## 2017-11-19 DIAGNOSIS — N183 Chronic kidney disease, stage 3 unspecified: Secondary | ICD-10-CM

## 2017-11-19 DIAGNOSIS — R5381 Other malaise: Secondary | ICD-10-CM

## 2017-11-19 DIAGNOSIS — M810 Age-related osteoporosis without current pathological fracture: Secondary | ICD-10-CM

## 2017-11-19 DIAGNOSIS — R5382 Chronic fatigue, unspecified: Secondary | ICD-10-CM | POA: Diagnosis not present

## 2017-11-19 DIAGNOSIS — M797 Fibromyalgia: Secondary | ICD-10-CM | POA: Diagnosis not present

## 2017-11-19 DIAGNOSIS — D803 Selective deficiency of immunoglobulin G [IgG] subclasses: Secondary | ICD-10-CM

## 2017-11-19 DIAGNOSIS — K582 Mixed irritable bowel syndrome: Secondary | ICD-10-CM | POA: Diagnosis not present

## 2017-11-19 DIAGNOSIS — I10 Essential (primary) hypertension: Secondary | ICD-10-CM

## 2017-11-19 DIAGNOSIS — K219 Gastro-esophageal reflux disease without esophagitis: Secondary | ICD-10-CM | POA: Diagnosis not present

## 2017-11-19 DIAGNOSIS — E785 Hyperlipidemia, unspecified: Secondary | ICD-10-CM | POA: Diagnosis not present

## 2017-11-19 DIAGNOSIS — I428 Other cardiomyopathies: Secondary | ICD-10-CM

## 2017-11-19 DIAGNOSIS — I700 Atherosclerosis of aorta: Secondary | ICD-10-CM

## 2017-11-19 MED ORDER — OMEPRAZOLE 20 MG PO CPDR: 20.0000 mg | DELAYED_RELEASE_CAPSULE | Freq: Every day | ORAL | 1 refills | Status: DC

## 2017-11-19 NOTE — Assessment & Plan Note (Addendum)
Reviewed with patient latest dexa 2017. Discussed calcium and vit D dosing. She receives fosamax through [facility]. Weight bearing exercises limited due to chronic pain

## 2017-11-19 NOTE — Assessment & Plan Note (Signed)
Chronic. Improved readings on zetia + addition of RYR BID. Continue. Statin intolerance. The 10-year ASCVD risk score Mikey Bussing DC Brooke Bonito., et al., 2013) is: 14.4%   Values used to calculate the score:     Age: 72 years     Sex: Female     Is Non-Hispanic African American: No     Diabetic: No     Tobacco smoker: No     Systolic Blood Pressure: 816 mmHg     Is BP treated: Yes     HDL Cholesterol: 45.3 mg/dL     Total Cholesterol: 197 mg/dL

## 2017-11-19 NOTE — Assessment & Plan Note (Signed)
Ongoing R ribcage pain after fall with rib fracture earlier this year - recommended she start gentle stretches to upper back, provided with rhomboid strain exercises.

## 2017-11-19 NOTE — Assessment & Plan Note (Addendum)
Possible recurrent symptoms - dry cough, globus sensation - recommended short 3 wk course of PPI she requests omeprazole 20mg .

## 2017-11-19 NOTE — Progress Notes (Signed)
BP 122/62 (BP Location: Left Arm, Patient Position: Sitting, Cuff Size: Normal)   Pulse 84   Temp 97.9 F (36.6 C) (Oral)   Ht 5' 4.5" (1.638 m)   Wt 181 lb 8 oz (82.3 kg)   SpO2 96%   BMI 30.67 kg/m    CC: AMW f/u visit Subjective:    Patient ID: [name], female    DOB: Apr 25, 1946, 72 y.o.   MRN: [id number]  HPI: SHUNTAE HERZIG is a 72 y.o. female presenting on 11/19/2017 for Annual Exam (Pt 2.)   Saw [place] week for medicare wellness visit. Note reviewed. Has had several falls.   Ongoing R upper chest pain since fall - had several rib fractures. Bending over still causes significant pain, sick feeling, weakness.   Has started seeing [facility] - cymbalta caused nausea. Transitioned to f/u PRN.  Saw [facility] Dr Yves Dill - transitioned to FODMAP diet - with improvement in diarrhea and abd pain. Has noted main problem with onions. Discussing colonoscopy - f/u planned 01/2018. She stopped omeprazole at least 1 yr ago.   Preventative: Colon cancer screening - ColoGuard 04/2015 negative. Considering colonoscopy end of 2019.  Lung cancer screening - not indicated Breast cancer screening - normal 01/2014 DUE pt states she had this done 2016 - records requested Well woman exam with [facility]) - s/p abd hysterectomy with BSO.  DEXA through [employer] - T -2.7 spine (01/2016) - on fosamax since at least 10 yrs along with calcium + vitamin D Flu shot - declines - egg allergy Td 2007 Pneumovax 2013, prevnar 2017 - bad reaction to this shingrix - discussed, declines  Advanced directive discussion - has at home. Husband is HCPOA. We need copy.  Seat belt use discussed  Sunscreen use discussed. No changing moles on skin. Upcoming appt with derm.  Non smoker Alcohol - none Dentist - 2 yrs ago Eye exam - yearly  Lives with husband. 1 adopted child  Activity: no regular exercise - limited by pain Diet: seldom water, good  fruits/vegetables   Relevant past medical, surgical, family and social history reviewed and updated as indicated. Interim medical history since our last visit reviewed. Allergies and medications reviewed and updated. Outpatient Medications Prior to Visit  Medication Sig Dispense Refill  . acetaminophen (TYLENOL) 500 MG tablet Take 500 mg by mouth daily as needed (PAIN).    Marland Kitchen alendronate (FOSAMAX) 70 MG tablet Take 70 mg by mouth once a week. Patient takes every Thursday    . AMBULATORY NON FORMULARY MEDICATION Medication Name:  1 squatty potty to use as needed 1 each 0  . Ascorbic Acid (VITAMIN C) 1000 MG tablet Take 1,000 mg by mouth daily.    Marland Kitchen aspirin EC 81 MG tablet Take 81 mg by mouth daily.    Marland Kitchen BIOTIN PO Take 1 tablet by mouth daily.    . Calcium Carbonate-Vitamin D (CALCIUM + D PO) Take 1 tablet by mouth daily.    . carvedilol (COREG) 6.25 MG tablet TAKE 1 TABLET (6.25 MG TOTAL) BY MOUTH 2 (TWO) TIMES DAILY WITH A MEAL. 180 tablet 3  . cholecalciferol (VITAMIN D) 1000 units tablet Take 2,000 Units by mouth daily.    . Coenzyme Q10 (CO Q 10 PO) Take 1 tablet by mouth daily.    Marland Kitchen dextromethorphan (DELSYM) 30 MG/5ML liquid Take 15 mg by mouth at bedtime as needed for cough.    . ezetimibe (ZETIA) 10  MG tablet TAKE 1 TABLET (10 MG TOTAL) BY MOUTH DAILY. 30 tablet 0  . GARLIC PO Take 1 tablet by mouth daily.    Marland Kitchen losartan (COZAAR) 50 MG tablet TAKE 1 TABLET BY MOUTH EVERY DAY 30 tablet 3  . nystatin cream (MYCOSTATIN) Apply 1 application topically 2 (two) times daily. 60 g 1  . potassium chloride (K-DUR) 10 MEQ tablet Take 10 mEq by mouth as directed. Take daily as needed    . Pyridoxine HCl (VITAMIN B6 PO) Take 1 capsule by mouth daily. Reported on 07/12/2015    . Red Yeast Rice 600 MG CAPS Take 1 capsule (600 mg total) by mouth 2 (two) times daily.    Marland Kitchen spironolactone (ALDACTONE) 25 MG tablet TAKE 1 TABLET BY MOUTH DAILY 90 tablet 3  . torsemide (DEMADEX) 10 MG tablet Take 10 mg by mouth  daily as needed (swelling/fluid).    . vitamin E 100 UNIT capsule Take 100 Units by mouth daily.     No facility-administered medications prior to visit.      Per HPI unless specifically indicated in ROS section below Review of Systems     Objective:    BP 122/62 (BP Location: Left Arm, Patient Position: Sitting, Cuff Size: Normal)   Pulse 84   Temp 97.9 F (36.6 C) (Oral)   Ht 5' 4.5" (1.638 m)   Wt 181 lb 8 oz (82.3 kg)   SpO2 96%   BMI 30.67 kg/m   Wt Readings from Last 3 Encounters:  11/19/17 181 lb 8 oz (82.3 kg)  11/09/17 177 lb 4 oz (80.4 kg)  10/15/17 173 lb (78.5 kg)    Physical Exam  Constitutional: She is oriented to person, place, and time. She appears well-developed and well-nourished. No distress.  HENT:  Head: Normocephalic and atraumatic.  Right Ear: Hearing, tympanic membrane, external ear and ear canal normal.  Left Ear: Hearing, tympanic membrane, external ear and ear canal normal.  Nose: Nose normal.  Mouth/Throat: Uvula is midline, oropharynx is clear and moist and mucous membranes are normal. No oropharyngeal exudate, posterior oropharyngeal edema or posterior oropharyngeal erythema.  Eyes: Pupils are equal, round, and reactive to light. Conjunctivae and EOM are normal. No scleral icterus.  Neck: Normal range of motion. Neck supple.  Cardiovascular: Normal rate, regular rhythm, normal heart sounds and intact distal pulses.  No murmur heard. Pulses:      Radial pulses are 2+ on the right side, and 2+ on the left side.  Pulmonary/Chest: Effort normal and breath sounds normal. No respiratory distress. She has no wheezes. She has no rales.  Abdominal: Soft. Bowel sounds are normal. She exhibits no distension and no mass. There is no tenderness. There is no rebound and no guarding. A hernia (incisional anterior abdomen) is present.  Musculoskeletal: Normal range of motion. She exhibits no edema.  Lymphadenopathy:    She has no cervical adenopathy.    Neurological: She is alert and oriented to person, place, and time.  CN grossly intact, station and gait intact  Skin: Skin is warm and dry. No rash noted.  Psychiatric: She has a normal mood and affect. Her behavior is normal. Judgment and thought content normal.  Nursing note and vitals reviewed.  Results for orders placed or performed in visit on 11/19/17  HM MAMMOGRAPHY  Result Value Ref Range   HM Mammogram Self Reported Normal 0-4 Bi-Rad, Self Reported Normal      Assessment & Plan:   Problem List Items Addressed This  Visit    Vitamin D deficiency    Improving readings. Continue current dose.       Rib pain on right side    Ongoing R ribcage pain after fall with rib fracture earlier this year - recommended she start gentle stretches to upper back, provided with rhomboid strain exercises.      Osteoporosis    Reviewed with patient latest dexa 2017. Discussed calcium and vit D dosing. She receives fosamax through [facility]. Weight bearing exercises limited due to chronic pain      Nonischemic cardiomyopathy (HCC)    Stable period. Appreciate cards care.       Irritable bowel syndrome    Doing better after elimination diet - has found she does better when avoiding onions.       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 Appreciate their care.       GERD (gastroesophageal reflux disease)    Possible recurrent symptoms - dry cough, globus sensation - recommended short 3 wk course of PPI she requests omeprazole 20mg .       Relevant Medications   omeprazole (PRILOSEC) 20 MG capsule   Fibromyalgia - Primary   Essential hypertension    Chronic, stable. Continue current regimen.       Dyslipidemia    Chronic. Improved readings on zetia + addition of RYR BID. Continue. Statin intolerance. The 10-year ASCVD risk score Mikey Bussing DC Brooke Bonito., et al., 2013) is: 14.4%   Values used to calculate the score:     Age: 74  years     Sex: Female     Is Non-Hispanic African American: No     Diabetic: No     Tobacco smoker: No     Systolic Blood Pressure: 814 mmHg     Is BP treated: Yes     HDL Cholesterol: 45.3 mg/dL     Total Cholesterol: 197 mg/dL       CKD (chronic kidney disease) stage 3, GFR 30-59 ml/min (HCC)    Reviewed with patient - continue to monitor.       Chronic fatigue and malaise   Advanced care planning/counseling discussion    Advanced directive discussion - has at home. Husband is HCPOA. We need copy.       Abdominal aortic atherosclerosis ([place])       Meds ordered this encounter  Medications  . omeprazole (PRILOSEC) 20 MG capsule    Sig: Take 1 capsule (20 mg total) by mouth daily.    Dispense:  30 capsule    Refill:  1   Orders Placed This Encounter  Procedures  . HM MAMMOGRAPHY    This external order was created through the Results Console.    Follow up plan: Return in about 6 months (around 05/22/2018) for follow up visit.  [name], MD

## 2017-11-19 NOTE — Assessment & Plan Note (Signed)
Advanced directive discussion - has at home. Husband is HCPOA. We need copy.

## 2017-11-19 NOTE — Patient Instructions (Addendum)
Take omeprazole for a 3 week course then stop. If not covered by insurance, let us know.  Keep upcoming GI appointments.  Labs were looking good today.  Return as needed or in 6 months for follow up visit.

## 2017-11-19 NOTE — Assessment & Plan Note (Signed)
Improving readings. Continue current dose.

## 2017-11-19 NOTE — Assessment & Plan Note (Signed)
Stable period. Appreciate cards care.

## 2017-11-19 NOTE — Assessment & Plan Note (Signed)
Doing better after elimination diet - has found she does better when avoiding onions.

## 2017-11-19 NOTE — Assessment & Plan Note (Signed)
Reviewed with patient - continue to monitor.

## 2017-11-19 NOTE — Assessment & Plan Note (Signed)
Chronic, stable. Continue current regimen. 

## 2017-11-19 NOTE — Assessment & Plan Note (Signed)
Has seen immunologist. Appreciate their care.

## 2017-11-28 DIAGNOSIS — H16223 Keratoconjunctivitis sicca, not specified as Sjogren's, bilateral: Secondary | ICD-10-CM | POA: Diagnosis not present

## 2017-11-28 DIAGNOSIS — H2513 Age-related nuclear cataract, bilateral: Secondary | ICD-10-CM | POA: Diagnosis not present

## 2017-12-07 LAB — CUP PACEART REMOTE DEVICE CHECK
Battery Voltage: 2.92 V
Brady Statistic RV Percent Paced: 0.01 %
Date Time Interrogation Session: 20190705113425
HighPow Impedance: 76 Ohm
Implantable Lead Implant Date: 20130301
Implantable Lead Location: 753860
Implantable Lead Model: 6935
Implantable Pulse Generator Implant Date: 20130301
Lead Channel Impedance Value: 494 Ohm
Lead Channel Pacing Threshold Amplitude: 0.375 V
Lead Channel Pacing Threshold Pulse Width: 0.4 ms
Lead Channel Sensing Intrinsic Amplitude: 6.625 mV
Lead Channel Sensing Intrinsic Amplitude: 6.625 mV
Lead Channel Setting Pacing Amplitude: 2.5 V
Lead Channel Setting Pacing Pulse Width: 0.4 ms
Lead Channel Setting Sensing Sensitivity: 0.3 mV

## 2017-12-11 ENCOUNTER — Other Ambulatory Visit: Payer: Self-pay | Admitting: Family Medicine

## 2017-12-12 DIAGNOSIS — M722 Plantar fascial fibromatosis: Secondary | ICD-10-CM | POA: Diagnosis not present

## 2017-12-13 ENCOUNTER — Other Ambulatory Visit: Payer: Self-pay | Admitting: Family Medicine

## 2017-12-13 ENCOUNTER — Encounter: Payer: Self-pay | Admitting: Family Medicine

## 2017-12-13 DIAGNOSIS — M722 Plantar fascial fibromatosis: Secondary | ICD-10-CM | POA: Insufficient documentation

## 2017-12-14 NOTE — Telephone Encounter (Signed)
Omeprazole According to 11/19/17 OV, it was suggested pt do a 3 wk course of PPI.  Do you want to refill this med?

## 2017-12-18 ENCOUNTER — Encounter: Payer: Self-pay | Admitting: Internal Medicine

## 2017-12-18 ENCOUNTER — Ambulatory Visit (INDEPENDENT_AMBULATORY_CARE_PROVIDER_SITE_OTHER): Payer: Medicare Other | Admitting: Internal Medicine

## 2017-12-18 ENCOUNTER — Ambulatory Visit: Payer: Self-pay | Admitting: Family Medicine

## 2017-12-18 DIAGNOSIS — J42 Unspecified chronic bronchitis: Secondary | ICD-10-CM | POA: Diagnosis not present

## 2017-12-18 MED ORDER — LEVOFLOXACIN 500 MG PO TABS: 500.0000 mg | ORAL_TABLET | Freq: Every day | ORAL | 0 refills | Status: DC

## 2017-12-18 MED ORDER — ALBUTEROL SULFATE HFA 108 (90 BASE) MCG/ACT IN AERS
2.0000 | INHALATION_SPRAY | Freq: Four times a day (QID) | RESPIRATORY_TRACT | 0 refills | Status: DC | PRN
Start: 2017-12-18 — End: 2018-01-09

## 2017-12-18 NOTE — Telephone Encounter (Signed)
Pt c/o productive cough. Pt stated that it is bronchitis. Pt stated that she coughed all night long. Pt stated that she took a small amount of cough syrup to try and help and takes Delsym prn for cough. Pt stated cough is productive and is coughing up light tan secretions. Pt has h/o pacemaker, congestive heart failure and COPD per pt. Pt stated that her chest feels "full" and her kidneys hurt, runny nose, hoarse and slight headache. Cough noted on phone sounds very strong and congested. Denies fever or wheezing.  Care advice given per protocol. Pt verbalized understanding. No availability at [facility] or [facility]. Pt agreeable to go to another [employer]. Pt lives in [place]. Appt made with Dr Sharlet Salina for today at 1:40 pm.   Reason for Disposition . [1] Known COPD or other severe lung disease (i.e., bronchiectasis, cystic fibrosis, lung surgery) AND [2] worsening symptoms (i.e., increased sputum purulence or amount, increased breathing difficulty  Answer Assessment - Initial Assessment Questions 1. ONSET: "When did the cough begin?"      yesterday 2. SEVERITY: "How bad is the cough today?"      Coughed all night used cough syrup  3. RESPIRATORY DISTRESS: "Describe your breathing."      Breathing feels heavy 4. FEVER: "Do you have a fever?" If so, ask: "What is your temperature, how was it measured, and when did it start?"     no 5. SPUTUM: "Describe the color of your sputum" (clear, white, yellow, green)    Light tan 6. HEMOPTYSIS: "Are you coughing up any blood?" If so ask: "How much?" (flecks, streaks, tablespoons, etc.)     no 7. CARDIAC HISTORY: "Do you have any history of heart disease?" (e.g., heart attack, congestive heart failure)      Pacemaker, congestive heart failure and atrial fibrillation 8. LUNG HISTORY: "Do you have any history of lung disease?"  (e.g., pulmonary embolus, asthma, emphysema)     Frequent bronchitis, asthma with bronchitis, COPD 9. PE  RISK FACTORS: "Do you have a history of blood clots?" (or: recent major surgery, recent prolonged travel, bedridden)     no 10. OTHER SYMPTOMS: "Do you have any other symptoms?" (e.g., runny nose, wheezing, chest pain)       Chest feels full, kidneys hurt, runny nose, hoarse, slight headache 11. PREGNANCY: "Is there any chance you are pregnant?" "When was your last menstrual period?"       n/a 12. TRAVEL: "Have you traveled out of the country in the last month?" (e.g., travel history, exposures)       no  Protocols used: [place]

## 2017-12-18 NOTE — Progress Notes (Signed)
   Subjective:    Patient ID: Ashley [name], female    DOB: 1945-05-29, 72 y.o.   MRN: [id number]  HPI The patient is a 72 YO female coming in for cough and sputum production for 1-2 days. Green to yellow sputum. She does have some elevation in her normal temperature at home. Does have also SOB and chest pressure and tightness. She has chronic bronchitis and was not able to tolerate any inhalers as they burned her lungs. She has albuterol inhaler at home (may be expired) but has not used. She has had to limit activity. She is worsening overall and is scared of pneumonia and hospital visit. She has had CHF flares in the past with pneumonia or bronchitis flares. She has been able to tolerate levaquin in the past but has had many problems with medications.   Review of Systems  Constitutional: Positive for activity change and appetite change. Negative for chills, fatigue, fever and unexpected weight change.  HENT: Positive for congestion, ear pain, postnasal drip, rhinorrhea and sinus pressure. Negative for ear discharge, sinus pain, sneezing, sore throat, tinnitus, trouble swallowing and voice change.   Eyes: Negative.   Respiratory: Positive for cough, chest tightness and shortness of breath. Negative for wheezing.   Cardiovascular: Negative.   Gastrointestinal: Negative.   Musculoskeletal: Positive for myalgias.  Neurological: Negative.       Objective:   Physical Exam  Constitutional: She is oriented to person, place, and time. She appears well-developed and well-nourished.  HENT:  Head: Normocephalic and atraumatic.  Oropharynx with redness and clear drainage, nose with swollen turbinates, TMs normal bilaterally  Eyes: EOM are normal.  Neck: Normal range of motion. No thyromegaly present.  Cardiovascular: Normal rate and regular rhythm.  Pulmonary/Chest: Effort normal. No respiratory distress. She has no wheezes. She has no rales.  Some rhonchi in the bases bilaterally which  partially clears with cough  Abdominal: Soft.  Lymphadenopathy:    She has no cervical adenopathy.  Neurological: She is alert and oriented to person, place, and time.  Skin: Skin is warm and dry.   Vitals:   12/18/17 1340  BP: 118/80  Pulse: 86  Temp: 98.2 F (36.8 C)  TempSrc: Oral  SpO2: 96%  Weight: 176 lb (79.8 kg)  Height: 5' 4.5" (1.638 m)      Assessment & Plan:

## 2017-12-18 NOTE — Assessment & Plan Note (Signed)
Rx for levaquin for change in normal symptoms and rx for albuterol inhaler. If no improvement needs cxr.

## 2017-12-18 NOTE — Patient Instructions (Signed)
We have sent in levaquin to take 1 pill daily for 1 week.   This medications can cause diarrhea. If you get diarrhea call the office for advice.

## 2017-12-21 DIAGNOSIS — M722 Plantar fascial fibromatosis: Secondary | ICD-10-CM | POA: Diagnosis not present

## 2017-12-22 ENCOUNTER — Encounter: Payer: Self-pay | Admitting: Internal Medicine

## 2017-12-24 ENCOUNTER — Ambulatory Visit (INDEPENDENT_AMBULATORY_CARE_PROVIDER_SITE_OTHER)
Admission: RE | Admit: 2017-12-24 | Discharge: 2017-12-24 | Disposition: A | Discharge status: Home / Self Care | Payer: Medicare Other | Source: Ambulatory Visit | Attending: Internal Medicine | Admitting: Internal Medicine

## 2017-12-24 ENCOUNTER — Encounter: Payer: Self-pay | Admitting: Internal Medicine

## 2017-12-24 ENCOUNTER — Ambulatory Visit: Payer: Self-pay | Admitting: *Deleted

## 2017-12-24 ENCOUNTER — Ambulatory Visit (INDEPENDENT_AMBULATORY_CARE_PROVIDER_SITE_OTHER): Payer: Medicare Other | Admitting: Internal Medicine

## 2017-12-24 VITALS — BP 122/80 | HR 71 | Temp 97.8°F | Ht 64.5 in | Wt 180.0 lb

## 2017-12-24 DIAGNOSIS — R05 Cough: Secondary | ICD-10-CM | POA: Diagnosis not present

## 2017-12-24 DIAGNOSIS — J42 Unspecified chronic bronchitis: Secondary | ICD-10-CM

## 2017-12-24 DIAGNOSIS — I5022 Chronic systolic (congestive) heart failure: Secondary | ICD-10-CM | POA: Diagnosis not present

## 2017-12-24 MED ORDER — PREDNISONE 20 MG PO TABS: 40.0000 mg | ORAL_TABLET | Freq: Every day | ORAL | 0 refills | Status: DC

## 2017-12-24 NOTE — Telephone Encounter (Signed)
Pt has appt with Dr Pricilla Holm 12/24/17 at 1:20.

## 2017-12-24 NOTE — Assessment & Plan Note (Signed)
She states she has had episode of fast heart rate she calls a fib with rvr. Has ICD in place. Her weight is up today although she states that at home it is not elevated. Checking CXR to rule out edema and adjust diuresis if needed. She is taking spironolactone and torsemide currently.

## 2017-12-24 NOTE — Telephone Encounter (Signed)
Patient was seen 12/18/17 with chronic bronchitis. She completed Levaquin yesterday. She continues to have heaviness in her chest, productive cough sometimes with green phlegm. All the coughing has given her headaches. Stated she is wheezing at times especially with exertion.Denies fever. Stated she used an aurora inhaler for the last 2 days as well as delsym cough syrup which helped some.  Did not mention the albuterol inhaler prescribed last week that I now see in Dr. Nathanial Millman note. Hx COPD/CHF/AFIB/Pacemaker. Appointment made for this afternoon. Reviewed sxs that would warrant immediate emergency treatment.    Reason for Disposition . [1] MODERATE longstanding difficulty breathing (e.g., speaks in phrases, SOB even at rest, pulse 100-120) AND [2] SAME as normal  Answer Assessment - Initial Assessment Questions 1. RESPIRATORY STATUS: "Describe your breathing?" (e.g., wheezing, shortness of breath, unable to speak, severe coughing)      Short of breath, productive cough, wheeze 2. ONSET: "When did this breathing problem begin?"      5 days now 3. PATTERN "Does the difficult breathing come and go, or has it been constant since it started?"      Constant heaviness to chest, wheezing with exertion. 4. SEVERITY: "How bad is your breathing?" (e.g., mild, moderate, severe)    - MILD: No SOB at rest, mild SOB with walking, speaks normally in sentences, can lay down, no retractions, pulse < 100.    - MODERATE: SOB at rest, SOB with minimal exertion and prefers to sit, cannot lie down flat, speaks in phrases, mild retractions, audible wheezing, pulse 100-120.    - SEVERE: Very SOB at rest, speaks in single words, struggling to breathe, sitting hunched forward, retractions, pulse > 120      moderate 5. RECURRENT SYMPTOM: "Have you had difficulty breathing before?" If so, ask: "When was the last time?" and "What happened that time?"     Yes, she has COPD. 6. CARDIAC HISTORY: "Do you have any history of  heart disease?" (e.g., heart attack, angina, bypass surgery, angioplasty)      Afib, pacemaker. 7. LUNG HISTORY: "Do you have any history of lung disease?"  (e.g., pulmonary embolus, asthma, emphysema)     COPD 8. CAUSE: "What do you think is causing the breathing problem?"      URI, was diagnosed last week  9. OTHER SYMPTOMS: "Do you have any other symptoms? (e.g., dizziness, runny nose, cough, chest pain, fever)    Cough, headache. 10. PREGNANCY: "Is there any chance you are pregnant?" "When was your last menstrual period?"       no 11. TRAVEL: "Have you traveled out of the country in the last month?" (e.g., travel history, exposures)       no  Protocols used: BREATHING DIFFICULTY-A-AH

## 2017-12-24 NOTE — Progress Notes (Signed)
   Subjective:    Patient ID: Ashley [name], female    DOB: 11/11/1945, 72 y.o.   MRN: [id number]  HPI The patient is a 72 YO female coming in for coughing and SOB and wheezing. She was here about 1 week ago and given rx for levaquin. She took all of this and is feeling mildly improved. Still coughing a lot. Wheezing more toward the end of the day. She has not really used albuterol inhaler just waits for it to pass. Denies fevers or chill. Started taking anoro which she has leftover from pulmonary although the last time she took it this burned her lungs. She has taken it 2 days in the last week and did not notice difference. Denies sinus drainage. She also admits she has had an episode of a fib with fast heart rate in the last week but did not take anything or call cardiology. Cannot do much activity.   Review of Systems  Constitutional: Positive for activity change and appetite change. Negative for chills, fatigue, fever and unexpected weight change.  HENT: Negative for congestion, ear discharge, ear pain, postnasal drip, rhinorrhea, sinus pressure, sinus pain, sneezing, sore throat, tinnitus, trouble swallowing and voice change.   Eyes: Negative.   Respiratory: Positive for cough, shortness of breath and wheezing. Negative for chest tightness.   Cardiovascular: Positive for chest pain and palpitations.  Gastrointestinal: Negative.   Musculoskeletal: Positive for myalgias.  Neurological: Negative.       Objective:   Physical Exam  Constitutional: She is oriented to person, place, and time. She appears well-developed and well-nourished.  HENT:  Head: Normocephalic and atraumatic.  Eyes: EOM are normal.  Neck: Normal range of motion.  Cardiovascular: Normal rate.  irregular  Pulmonary/Chest: Effort normal and breath sounds normal. No respiratory distress. She has no wheezes. She has no rales.  Some scattered rhonchi, no rales  Abdominal: Soft. Bowel sounds are normal. She exhibits no  distension. There is no tenderness. There is no rebound.  Musculoskeletal: She exhibits no edema.  Neurological: She is alert and oriented to person, place, and time. Coordination normal.  Skin: Skin is warm and dry.   Vitals:   12/24/17 1307  BP: 122/80  Pulse: 71  Temp: 97.8 F (36.6 C)  TempSrc: Oral  SpO2: 97%  Weight: 180 lb (81.6 kg)  Height: 5' 4.5" (1.638 m)      Assessment & Plan:

## 2017-12-24 NOTE — Assessment & Plan Note (Signed)
Suspect flare today. Rx for prednisone. She has just finished 1 week course of levaquin. Checking CXR to rule out pneumonia or fluid. Adjust plan as needed. She is counseled about albuterol inhaler usage and she is advised to use this when she is wheezing or SOB.

## 2017-12-24 NOTE — Patient Instructions (Signed)
We have sent in prednisone to take 2 pills daily for 5 days.   We are checking the x-ray of the chest today to look for fluid or pneumonia.

## 2017-12-25 DIAGNOSIS — H16223 Keratoconjunctivitis sicca, not specified as Sjogren's, bilateral: Secondary | ICD-10-CM | POA: Diagnosis not present

## 2017-12-25 DIAGNOSIS — H538 Other visual disturbances: Secondary | ICD-10-CM | POA: Diagnosis not present

## 2017-12-27 ENCOUNTER — Ambulatory Visit (INDEPENDENT_AMBULATORY_CARE_PROVIDER_SITE_OTHER): Payer: Medicare Other | Admitting: Internal Medicine

## 2017-12-27 ENCOUNTER — Encounter: Payer: Self-pay | Admitting: Internal Medicine

## 2017-12-27 VITALS — BP 126/66 | HR 83 | Resp 16 | Ht 64.5 in | Wt 179.0 lb

## 2017-12-27 DIAGNOSIS — J418 Mixed simple and mucopurulent chronic bronchitis: Secondary | ICD-10-CM | POA: Diagnosis not present

## 2017-12-27 NOTE — Patient Instructions (Signed)
Would use mucinex to see if that helps your cough.
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
## 2017-12-29 ENCOUNTER — Observation Stay (HOSPITAL_COMMUNITY)
Admission: EM | Admit: 2017-12-29 | Discharge: 2017-12-30 | Disposition: A | Discharge status: Home / Self Care | Payer: Medicare Other | Attending: Internal Medicine | Admitting: Internal Medicine

## 2017-12-29 ENCOUNTER — Other Ambulatory Visit: Payer: Self-pay

## 2017-12-29 ENCOUNTER — Emergency Department (HOSPITAL_COMMUNITY): Payer: Medicare Other

## 2017-12-29 DIAGNOSIS — I420 Dilated cardiomyopathy: Secondary | ICD-10-CM | POA: Diagnosis present

## 2017-12-29 DIAGNOSIS — M797 Fibromyalgia: Secondary | ICD-10-CM | POA: Insufficient documentation

## 2017-12-29 DIAGNOSIS — Z85828 Personal history of other malignant neoplasm of skin: Secondary | ICD-10-CM | POA: Insufficient documentation

## 2017-12-29 DIAGNOSIS — R531 Weakness: Secondary | ICD-10-CM | POA: Diagnosis not present

## 2017-12-29 DIAGNOSIS — J440 Chronic obstructive pulmonary disease with acute lower respiratory infection: Secondary | ICD-10-CM | POA: Insufficient documentation

## 2017-12-29 DIAGNOSIS — M1990 Unspecified osteoarthritis, unspecified site: Secondary | ICD-10-CM | POA: Insufficient documentation

## 2017-12-29 DIAGNOSIS — K219 Gastro-esophageal reflux disease without esophagitis: Secondary | ICD-10-CM | POA: Diagnosis not present

## 2017-12-29 DIAGNOSIS — Z79899 Other long term (current) drug therapy: Secondary | ICD-10-CM | POA: Insufficient documentation

## 2017-12-29 DIAGNOSIS — Z91040 Latex allergy status: Secondary | ICD-10-CM | POA: Insufficient documentation

## 2017-12-29 DIAGNOSIS — E785 Hyperlipidemia, unspecified: Secondary | ICD-10-CM | POA: Insufficient documentation

## 2017-12-29 DIAGNOSIS — R61 Generalized hyperhidrosis: Secondary | ICD-10-CM | POA: Diagnosis not present

## 2017-12-29 DIAGNOSIS — Z90710 Acquired absence of both cervix and uterus: Secondary | ICD-10-CM | POA: Insufficient documentation

## 2017-12-29 DIAGNOSIS — Z90722 Acquired absence of ovaries, bilateral: Secondary | ICD-10-CM | POA: Insufficient documentation

## 2017-12-29 DIAGNOSIS — Z89422 Acquired absence of other left toe(s): Secondary | ICD-10-CM | POA: Insufficient documentation

## 2017-12-29 DIAGNOSIS — Z883 Allergy status to other anti-infective agents status: Secondary | ICD-10-CM | POA: Insufficient documentation

## 2017-12-29 DIAGNOSIS — I5042 Chronic combined systolic (congestive) and diastolic (congestive) heart failure: Secondary | ICD-10-CM | POA: Diagnosis present

## 2017-12-29 DIAGNOSIS — Z91018 Allergy to other foods: Secondary | ICD-10-CM | POA: Insufficient documentation

## 2017-12-29 DIAGNOSIS — K589 Irritable bowel syndrome without diarrhea: Secondary | ICD-10-CM | POA: Diagnosis not present

## 2017-12-29 DIAGNOSIS — Z8249 Family history of ischemic heart disease and other diseases of the circulatory system: Secondary | ICD-10-CM | POA: Insufficient documentation

## 2017-12-29 DIAGNOSIS — G4700 Insomnia, unspecified: Secondary | ICD-10-CM | POA: Diagnosis not present

## 2017-12-29 DIAGNOSIS — Z887 Allergy status to serum and vaccine status: Secondary | ICD-10-CM | POA: Diagnosis not present

## 2017-12-29 DIAGNOSIS — Z888 Allergy status to other drugs, medicaments and biological substances status: Secondary | ICD-10-CM | POA: Diagnosis not present

## 2017-12-29 DIAGNOSIS — Z885 Allergy status to narcotic agent status: Secondary | ICD-10-CM | POA: Insufficient documentation

## 2017-12-29 DIAGNOSIS — Z880 Allergy status to penicillin: Secondary | ICD-10-CM | POA: Insufficient documentation

## 2017-12-29 DIAGNOSIS — I959 Hypotension, unspecified: Secondary | ICD-10-CM | POA: Diagnosis not present

## 2017-12-29 DIAGNOSIS — Z9889 Other specified postprocedural states: Secondary | ICD-10-CM | POA: Insufficient documentation

## 2017-12-29 DIAGNOSIS — J209 Acute bronchitis, unspecified: Secondary | ICD-10-CM | POA: Diagnosis not present

## 2017-12-29 DIAGNOSIS — I110 Hypertensive heart disease with heart failure: Secondary | ICD-10-CM | POA: Diagnosis not present

## 2017-12-29 DIAGNOSIS — J42 Unspecified chronic bronchitis: Secondary | ICD-10-CM | POA: Diagnosis not present

## 2017-12-29 DIAGNOSIS — I051 Rheumatic mitral insufficiency: Secondary | ICD-10-CM | POA: Insufficient documentation

## 2017-12-29 DIAGNOSIS — I5022 Chronic systolic (congestive) heart failure: Secondary | ICD-10-CM | POA: Diagnosis not present

## 2017-12-29 DIAGNOSIS — Z9049 Acquired absence of other specified parts of digestive tract: Secondary | ICD-10-CM | POA: Insufficient documentation

## 2017-12-29 DIAGNOSIS — Z882 Allergy status to sulfonamides status: Secondary | ICD-10-CM | POA: Insufficient documentation

## 2017-12-29 DIAGNOSIS — Z933 Colostomy status: Secondary | ICD-10-CM | POA: Insufficient documentation

## 2017-12-29 DIAGNOSIS — R55 Syncope and collapse: Secondary | ICD-10-CM | POA: Diagnosis not present

## 2017-12-29 DIAGNOSIS — Z823 Family history of stroke: Secondary | ICD-10-CM | POA: Insufficient documentation

## 2017-12-29 DIAGNOSIS — Z7982 Long term (current) use of aspirin: Secondary | ICD-10-CM | POA: Insufficient documentation

## 2017-12-29 DIAGNOSIS — G4489 Other headache syndrome: Secondary | ICD-10-CM | POA: Diagnosis not present

## 2017-12-29 DIAGNOSIS — Z8261 Family history of arthritis: Secondary | ICD-10-CM | POA: Insufficient documentation

## 2017-12-29 LAB — BASIC METABOLIC PANEL
Anion gap: 12 (ref 5–15)
BUN: 26 mg/dL — ABNORMAL HIGH (ref 8–23)
CO2: 24 mmol/L (ref 22–32)
Calcium: 9.7 mg/dL (ref 8.9–10.3)
Chloride: 102 mmol/L (ref 98–111)
Creatinine, Ser: 1.17 mg/dL — ABNORMAL HIGH (ref 0.44–1.00)
GFR calc Af Amer: 53 mL/min — ABNORMAL LOW (ref >60)
GFR calc non Af Amer: 45 mL/min — ABNORMAL LOW (ref >60)
Glucose, Bld: 114 mg/dL — ABNORMAL HIGH (ref 70–99)
Potassium: 3.8 mmol/L (ref 3.5–5.1)
Sodium: 138 mmol/L (ref 135–145)

## 2017-12-29 LAB — HEPATIC FUNCTION PANEL
ALT: 23 U/L (ref 0–44)
AST: 27 U/L (ref 15–41)
Albumin: 3.8 g/dL (ref 3.5–5.0)
Alkaline Phosphatase: 90 U/L (ref 38–126)
Bilirubin, Direct: 0.2 mg/dL (ref 0.0–0.2)
Indirect Bilirubin: 0.4 mg/dL (ref 0.3–0.9)
Total Bilirubin: 0.6 mg/dL (ref 0.3–1.2)
Total Protein: 7.4 g/dL (ref 6.5–8.1)

## 2017-12-29 LAB — URINALYSIS, ROUTINE W REFLEX MICROSCOPIC
Bacteria, UA: NONE SEEN
Bilirubin Urine: NEGATIVE
Glucose, UA: NEGATIVE mg/dL
Hgb urine dipstick: NEGATIVE
Ketones, ur: NEGATIVE mg/dL
Nitrite: NEGATIVE
Protein, ur: NEGATIVE mg/dL
Specific Gravity, Urine: 1.008 (ref 1.005–1.030)
pH: 5 (ref 5.0–8.0)

## 2017-12-29 LAB — CBC
HCT: 40.2 % (ref 36.0–46.0)
Hemoglobin: 12.9 g/dL (ref 12.0–15.0)
MCH: 29.7 pg (ref 26.0–34.0)
MCHC: 32.1 g/dL (ref 30.0–36.0)
MCV: 92.4 fL (ref 78.0–100.0)
Platelets: 241 10*3/uL (ref 150–400)
RBC: 4.35 MIL/uL (ref 3.87–5.11)
RDW: 12.7 % (ref 11.5–15.5)
WBC: 18.7 10*3/uL — ABNORMAL HIGH (ref 4.0–10.5)

## 2017-12-29 LAB — TSH: TSH: 0.569 u[IU]/mL (ref 0.350–4.500)

## 2017-12-29 LAB — DIFFERENTIAL
Abs Immature Granulocytes: 0.3 10*3/uL — ABNORMAL HIGH (ref 0.0–0.1)
Basophils Absolute: 0.1 10*3/uL (ref 0.0–0.1)
Basophils Relative: 0 %
Eosinophils Absolute: 0.1 10*3/uL (ref 0.0–0.7)
Eosinophils Relative: 1 %
Immature Granulocytes: 2 %
Lymphocytes Relative: 27 %
Lymphs Abs: 5.1 10*3/uL — ABNORMAL HIGH (ref 0.7–4.0)
Monocytes Absolute: 1.5 10*3/uL — ABNORMAL HIGH (ref 0.1–1.0)
Monocytes Relative: 8 %
Neutro Abs: 11.8 10*3/uL — ABNORMAL HIGH (ref 1.7–7.7)
Neutrophils Relative %: 62 %

## 2017-12-29 LAB — CBG MONITORING, ED: Glucose-Capillary: 118 mg/dL — ABNORMAL HIGH (ref 70–99)

## 2017-12-29 LAB — D-DIMER, QUANTITATIVE: D-Dimer, Quant: 1.33 ug/mL-FEU — ABNORMAL HIGH (ref 0.00–0.50)

## 2017-12-29 MED ORDER — VITAMIN B-6 50 MG PO TABS
50.0000 mg | ORAL_TABLET | Freq: Every day | ORAL | Status: DC
  Administered 2017-12-30: 50 mg via ORAL
  Filled 2017-12-29: qty 1

## 2017-12-29 MED ORDER — PREDNISONE 20 MG PO TABS
40.0000 mg | ORAL_TABLET | Freq: Every day | ORAL | Status: DC
  Administered 2017-12-29: 20 mg via ORAL
  Filled 2017-12-29 (×2): qty 2

## 2017-12-29 MED ORDER — SODIUM CHLORIDE 0.9 % IV SOLN
INTRAVENOUS | Status: DC
  Administered 2017-12-29: 23:00:00 via INTRAVENOUS

## 2017-12-29 MED ORDER — ALBUTEROL SULFATE (2.5 MG/3ML) 0.083% IN NEBU: 2.5000 mg | INHALATION_SOLUTION | Freq: Four times a day (QID) | RESPIRATORY_TRACT | Status: DC | PRN

## 2017-12-29 MED ORDER — ACETAMINOPHEN 325 MG PO TABS: 650.0000 mg | ORAL_TABLET | Freq: Four times a day (QID) | ORAL | Status: DC | PRN

## 2017-12-29 MED ORDER — VITAMIN E 45 MG (100 UNIT) PO CAPS
100.0000 [IU] | ORAL_CAPSULE | Freq: Every day | ORAL | Status: DC
  Administered 2017-12-30: 100 [IU] via ORAL
  Filled 2017-12-29: qty 1

## 2017-12-29 MED ORDER — VITAMIN C 500 MG PO TABS
1000.0000 mg | ORAL_TABLET | Freq: Every day | ORAL | Status: DC
  Administered 2017-12-30: 1000 mg via ORAL
  Filled 2017-12-29: qty 2

## 2017-12-29 MED ORDER — ASPIRIN EC 81 MG PO TBEC
81.0000 mg | DELAYED_RELEASE_TABLET | Freq: Every day | ORAL | Status: DC
  Administered 2017-12-29: 81 mg via ORAL
  Filled 2017-12-29 (×2): qty 1

## 2017-12-29 MED ORDER — EZETIMIBE 10 MG PO TABS
10.0000 mg | ORAL_TABLET | Freq: Every day | ORAL | Status: DC
  Administered 2017-12-29 – 2017-12-30 (×2): 10 mg via ORAL
  Filled 2017-12-29 (×2): qty 1

## 2017-12-29 MED ORDER — SENNOSIDES-DOCUSATE SODIUM 8.6-50 MG PO TABS: 1.0000 | ORAL_TABLET | Freq: Every evening | ORAL | Status: DC | PRN

## 2017-12-29 MED ORDER — CARVEDILOL 12.5 MG PO TABS
6.2500 mg | ORAL_TABLET | Freq: Two times a day (BID) | ORAL | Status: DC
  Administered 2017-12-30: 6.25 mg via ORAL
  Filled 2017-12-29: qty 1

## 2017-12-29 MED ORDER — ONDANSETRON HCL 4 MG/2ML IJ SOLN: 4.0000 mg | Freq: Four times a day (QID) | INTRAMUSCULAR | Status: DC | PRN

## 2017-12-29 MED ORDER — ONDANSETRON HCL 4 MG PO TABS: 4.0000 mg | ORAL_TABLET | Freq: Four times a day (QID) | ORAL | Status: DC | PRN

## 2017-12-29 MED ORDER — SODIUM CHLORIDE 0.9 % IV BOLUS
500.0000 mL | Freq: Once | INTRAVENOUS | Status: AC
  Administered 2017-12-29: 500 mL via INTRAVENOUS

## 2017-12-29 MED ORDER — LOSARTAN POTASSIUM 50 MG PO TABS
50.0000 mg | ORAL_TABLET | Freq: Every day | ORAL | Status: DC
  Filled 2017-12-29: qty 1

## 2017-12-29 MED ORDER — VITAMIN D 1000 UNITS PO TABS
2000.0000 [IU] | ORAL_TABLET | Freq: Every day | ORAL | Status: DC
  Administered 2017-12-30: 2000 [IU] via ORAL
  Filled 2017-12-29: qty 2

## 2017-12-29 MED ORDER — ALENDRONATE SODIUM 70 MG PO TABS: 70.0000 mg | ORAL_TABLET | ORAL | Status: DC

## 2017-12-29 MED ORDER — ACETAMINOPHEN 650 MG RE SUPP: 650.0000 mg | Freq: Four times a day (QID) | RECTAL | Status: DC | PRN

## 2017-12-29 MED ORDER — SODIUM CHLORIDE 0.9% FLUSH
3.0000 mL | Freq: Two times a day (BID) | INTRAVENOUS | Status: DC
  Administered 2017-12-29 – 2017-12-30 (×2): 3 mL via INTRAVENOUS

## 2017-12-29 MED ORDER — ALBUTEROL SULFATE HFA 108 (90 BASE) MCG/ACT IN AERS: 2.0000 | INHALATION_SPRAY | Freq: Four times a day (QID) | RESPIRATORY_TRACT | Status: DC | PRN

## 2017-12-29 MED ORDER — PANTOPRAZOLE SODIUM 40 MG PO TBEC
40.0000 mg | DELAYED_RELEASE_TABLET | Freq: Every day | ORAL | Status: DC
  Filled 2017-12-29: qty 1

## 2017-12-29 MED ORDER — DEXTROMETHORPHAN POLISTIREX ER 30 MG/5ML PO SUER
15.0000 mg | Freq: Every evening | ORAL | Status: DC | PRN
  Administered 2017-12-29: 15 mg via ORAL
  Filled 2017-12-29 (×2): qty 5

## 2017-12-29 MED ORDER — CALCIUM CARBONATE-VITAMIN D 500-200 MG-UNIT PO TABS
ORAL_TABLET | Freq: Every day | ORAL | Status: DC
  Administered 2017-12-30: 1 via ORAL
  Filled 2017-12-29 (×2): qty 1
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
## 2017-12-29 NOTE — ED Notes (Signed)
Consulting MD at bedside
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
## 2017-12-30 ENCOUNTER — Other Ambulatory Visit: Payer: Self-pay

## 2017-12-30 ENCOUNTER — Observation Stay (HOSPITAL_COMMUNITY): Payer: Medicare Other

## 2017-12-30 ENCOUNTER — Encounter (HOSPITAL_COMMUNITY): Payer: Self-pay | Admitting: *Deleted

## 2017-12-30 ENCOUNTER — Observation Stay (HOSPITAL_BASED_OUTPATIENT_CLINIC_OR_DEPARTMENT_OTHER): Payer: Medicare Other

## 2017-12-30 DIAGNOSIS — R55 Syncope and collapse: Secondary | ICD-10-CM

## 2017-12-30 DIAGNOSIS — K219 Gastro-esophageal reflux disease without esophagitis: Secondary | ICD-10-CM | POA: Diagnosis not present

## 2017-12-30 DIAGNOSIS — R079 Chest pain, unspecified: Secondary | ICD-10-CM | POA: Diagnosis not present

## 2017-12-30 DIAGNOSIS — I051 Rheumatic mitral insufficiency: Secondary | ICD-10-CM | POA: Diagnosis not present

## 2017-12-30 DIAGNOSIS — I110 Hypertensive heart disease with heart failure: Secondary | ICD-10-CM | POA: Diagnosis not present

## 2017-12-30 DIAGNOSIS — J440 Chronic obstructive pulmonary disease with acute lower respiratory infection: Secondary | ICD-10-CM | POA: Diagnosis not present

## 2017-12-30 DIAGNOSIS — G4700 Insomnia, unspecified: Secondary | ICD-10-CM | POA: Diagnosis not present

## 2017-12-30 DIAGNOSIS — I5022 Chronic systolic (congestive) heart failure: Secondary | ICD-10-CM | POA: Diagnosis not present

## 2017-12-30 DIAGNOSIS — K589 Irritable bowel syndrome without diarrhea: Secondary | ICD-10-CM | POA: Diagnosis not present

## 2017-12-30 DIAGNOSIS — I340 Nonrheumatic mitral (valve) insufficiency: Secondary | ICD-10-CM

## 2017-12-30 DIAGNOSIS — J209 Acute bronchitis, unspecified: Secondary | ICD-10-CM | POA: Diagnosis not present

## 2017-12-30 DIAGNOSIS — R0602 Shortness of breath: Secondary | ICD-10-CM | POA: Diagnosis not present

## 2017-12-30 DIAGNOSIS — E785 Hyperlipidemia, unspecified: Secondary | ICD-10-CM | POA: Diagnosis not present

## 2017-12-30 DIAGNOSIS — I420 Dilated cardiomyopathy: Secondary | ICD-10-CM | POA: Diagnosis not present

## 2017-12-30 DIAGNOSIS — M797 Fibromyalgia: Secondary | ICD-10-CM | POA: Diagnosis not present

## 2017-12-30 LAB — GLUCOSE, CAPILLARY: Glucose-Capillary: 138 mg/dL — ABNORMAL HIGH (ref 70–99)

## 2017-12-30 LAB — CBC
HCT: 38.2 % (ref 36.0–46.0)
Hemoglobin: 12.3 g/dL (ref 12.0–15.0)
MCH: 29.3 pg (ref 26.0–34.0)
MCHC: 32.2 g/dL (ref 30.0–36.0)
MCV: 91 fL (ref 78.0–100.0)
Platelets: 184 10*3/uL (ref 150–400)
RBC: 4.2 MIL/uL (ref 3.87–5.11)
RDW: 12.8 % (ref 11.5–15.5)
WBC: 15.2 10*3/uL — ABNORMAL HIGH (ref 4.0–10.5)

## 2017-12-30 LAB — URINE CULTURE: Culture: <10000 — AB

## 2017-12-30 LAB — COMPREHENSIVE METABOLIC PANEL
ALT: 23 U/L (ref 0–44)
AST: 18 U/L (ref 15–41)
Albumin: 3.7 g/dL (ref 3.5–5.0)
Alkaline Phosphatase: 89 U/L (ref 38–126)
Anion gap: 8 (ref 5–15)
BUN: 25 mg/dL — ABNORMAL HIGH (ref 8–23)
CO2: 24 mmol/L (ref 22–32)
Calcium: 9.1 mg/dL (ref 8.9–10.3)
Chloride: 107 mmol/L (ref 98–111)
Creatinine, Ser: 1.18 mg/dL — ABNORMAL HIGH (ref 0.44–1.00)
GFR calc Af Amer: 52 mL/min — ABNORMAL LOW (ref >60)
GFR calc non Af Amer: 45 mL/min — ABNORMAL LOW (ref >60)
Glucose, Bld: 151 mg/dL — ABNORMAL HIGH (ref 70–99)
Potassium: 4.5 mmol/L (ref 3.5–5.1)
Sodium: 139 mmol/L (ref 135–145)
Total Bilirubin: 0.8 mg/dL (ref 0.3–1.2)
Total Protein: 7.2 g/dL (ref 6.5–8.1)

## 2017-12-30 LAB — ECHOCARDIOGRAM COMPLETE: Weight: 2832 oz

## 2017-12-30 LAB — TROPONIN I: Troponin I: <0.03 ng/mL (ref <0.03)

## 2017-12-30 MED ORDER — TECHNETIUM TO 99M ALBUMIN AGGREGATED
4.2000 | Freq: Once | INTRAVENOUS | Status: AC | PRN
  Administered 2017-12-30: 4.2 via INTRAVENOUS

## 2017-12-30 MED ORDER — TECHNETIUM TC 99M DIETHYLENETRIAME-PENTAACETIC ACID
32.0000 | Freq: Once | INTRAVENOUS | Status: AC | PRN
  Administered 2017-12-30: 32 via RESPIRATORY_TRACT

## 2017-12-30 NOTE — Progress Notes (Signed)
VASCULAR LAB PRELIMINARY  PRELIMINARY  PRELIMINARY  PRELIMINARY  Carotid duplex completed.    Preliminary report:  1-39% ICA plaquing. Vertebral artery flow is antegrade.  Jermarcus Mcfadyen, RVT 12/30/2017, 4:07 PM
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
## 2017-12-30 NOTE — Progress Notes (Signed)
  Echocardiogram 2D Echocardiogram has been performed.  Ashley [name] 12/30/2017, 10:30 AM

## 2017-12-30 NOTE — Progress Notes (Signed)
VASCULAR LAB PRELIMINARY  PRELIMINARY  PRELIMINARY  PRELIMINARY  Bilateral lower extremity venous duplex completed.    Preliminary report:  There is no DVT or SVT noted in the bilateral lower extremities.   Ronan Duecker, RVT 12/30/2017, 4:11 PM

## 2017-12-30 NOTE — Discharge Instructions (Signed)
Do not drive for at least 6 months. Follow up with your cardiologist for further care .

## 2017-12-30 NOTE — Plan of Care (Signed)
?  Problem: Coping: ?Goal: Level of anxiety will decrease ?Outcome: Progressing ?  ?Problem: Safety: ?Goal: Ability to remain free from injury will improve ?Outcome: Progressing ?  ?
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
## 2017-12-30 NOTE — Progress Notes (Signed)
New onset of BBB, MD aware. Will obtain an ekg per order.

## 2017-12-31 ENCOUNTER — Telehealth: Payer: Self-pay | Admitting: *Deleted

## 2017-12-31 NOTE — Telephone Encounter (Signed)
Attempted to complete TCM;unable to leave vm 

## 2018-01-01 ENCOUNTER — Telehealth: Payer: Self-pay | Admitting: Family Medicine

## 2018-01-01 NOTE — Telephone Encounter (Signed)
Copied from [place] [id number]. Topic: Appointment Scheduling - Scheduling Inquiry for Clinic >> Jan 01, 2018  9:53 AM Rutherford Nail, NT wrote: Reason for CRM: Patient calling and is requesting to be seen this week for her hospital follow up. States that she is supposed to go out of town this Sunday. Please advise. Patient currently has HFU scheduled for 01/08/18 CB#: [phone]

## 2018-01-01 NOTE — Telephone Encounter (Signed)
May place Friday at noon 30 min?

## 2018-01-01 NOTE — Telephone Encounter (Signed)
Lm requesting return call to complete TCM and confirm hosp f/u appt  

## 2018-01-01 NOTE — Telephone Encounter (Signed)
See below crm Can pt be working in sooner if so when  Copied from [place] [id number]. Topic: Appointment Scheduling - Scheduling Inquiry for Clinic >> Jan 01, 2018  9:53 AM Rutherford Nail, NT wrote: Reason for CRM: Patient calling and is requesting to be seen this week for her hospital follow up. States that she is supposed to go out of town this Sunday. Please advise. Patient currently has HFU scheduled for 01/08/18 CB#: [phone]

## 2018-01-02 ENCOUNTER — Ambulatory Visit: Payer: Self-pay

## 2018-01-02 NOTE — Telephone Encounter (Signed)
Noted  

## 2018-01-02 NOTE — Telephone Encounter (Signed)
Left message asking pt to call office  °
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
## 2018-01-02 NOTE — Telephone Encounter (Signed)
Pt aware.

## 2018-01-02 NOTE — Telephone Encounter (Signed)
Pt scheduled on 01/04/18 at 12:00 PM.

## 2018-01-04 ENCOUNTER — Encounter: Payer: Self-pay | Admitting: Family Medicine

## 2018-01-04 ENCOUNTER — Ambulatory Visit (INDEPENDENT_AMBULATORY_CARE_PROVIDER_SITE_OTHER): Payer: Medicare Other | Admitting: Family Medicine

## 2018-01-04 VITALS — BP 118/68 | HR 82 | Temp 98.1°F | Ht 64.5 in | Wt 179.0 lb

## 2018-01-04 DIAGNOSIS — R55 Syncope and collapse: Secondary | ICD-10-CM

## 2018-01-04 DIAGNOSIS — I420 Dilated cardiomyopathy: Secondary | ICD-10-CM

## 2018-01-04 DIAGNOSIS — D803 Selective deficiency of immunoglobulin G [IgG] subclasses: Secondary | ICD-10-CM | POA: Diagnosis not present

## 2018-01-04 DIAGNOSIS — R590 Localized enlarged lymph nodes: Secondary | ICD-10-CM | POA: Diagnosis not present

## 2018-01-04 DIAGNOSIS — I5022 Chronic systolic (congestive) heart failure: Secondary | ICD-10-CM | POA: Diagnosis not present

## 2018-01-04 DIAGNOSIS — J418 Mixed simple and mucopurulent chronic bronchitis: Secondary | ICD-10-CM

## 2018-01-04 DIAGNOSIS — I428 Other cardiomyopathies: Secondary | ICD-10-CM

## 2018-01-04 MED ORDER — LEVALBUTEROL TARTRATE 45 MCG/ACT IN AERO
2.0000 | INHALATION_SPRAY | RESPIRATORY_TRACT | 3 refills | Status: DC | PRN
Start: 2018-01-04 — End: 2018-09-20

## 2018-01-04 MED ORDER — TIOTROPIUM BROMIDE MONOHYDRATE 18 MCG IN CAPS: 18.0000 ug | ORAL_CAPSULE | Freq: Every day | RESPIRATORY_TRACT | 11 refills | Status: DC
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
## 2018-01-04 NOTE — Patient Instructions (Addendum)
Touch base with Dr [name] about your spironolactone, torsemide, losartan and potassium dosing.  Start spiriva daily medicine for lungs (you have taken this before).  May try mucinex again when feeling better. Make sure it's plain guaifenesin.  Price out xopenex in place of albuterol inahler (rescue medicine for cough or wheezing).  Restart tessalon perls during the day, codeine cough syrup at night (you have these at home)

## 2018-01-05 ENCOUNTER — Other Ambulatory Visit: Payer: Self-pay | Admitting: Cardiovascular Disease

## 2018-01-05 DIAGNOSIS — R590 Localized enlarged lymph nodes: Secondary | ICD-10-CM | POA: Insufficient documentation

## 2018-01-05 NOTE — Assessment & Plan Note (Signed)
S/p thorough cards eval in hospital - thought neurally mediated syncope. Reviewed importance of good hydration status, careful positional transitions, and taking it slow to prevent further syncope.  Blood pressure log she brings from home is stable. Orthostatics negative in office today.  She will defer driving at this time.

## 2018-01-05 NOTE — Assessment & Plan Note (Signed)
Today's exam consistent with reactive L AC LAD. This is very tender, but reassured pt it should improve over time. Avoid NSAIDs in CKD.

## 2018-01-05 NOTE — Assessment & Plan Note (Signed)
Recently treated for recurrent bronchitis with levaquin and prednisone course (some confusion with prednisone dosing - she actually under dosed prednisone as she only received 10mg  tablets from pharmacy). I don't see evidence of recurrent or ongoing bacterial infection so recommended against rpt abx. Reviewed recent notes including latest pulm note. Reviewed with patient management of chronic bronchitis. Currently she is only using rare albuterol, and notes worsening tachycardia. Will send in xopenex to see if insurance will cover this, and to price out. Discussed controller medication - will trial spiriva. Pt states she has taken and tolerated this well in the past.

## 2018-01-05 NOTE — Assessment & Plan Note (Signed)
Sees immunology at [facility] - thought not clinically significant at this time. Not due to malignancy, no clinical immunodeficiency at this time.

## 2018-01-05 NOTE — Assessment & Plan Note (Signed)
Chronic, latest EF had normalized. Discussed she may be able to come off some of her cardiac medications if cardiac function has stabilized. Pt would like to discuss possible medication changes with Dr [name].

## 2018-01-05 NOTE — Assessment & Plan Note (Signed)
Improved on latest echo - await cards recs next month regarding med regimen.

## 2018-01-07 ENCOUNTER — Encounter: Payer: Medicare Other | Admitting: Internal Medicine

## 2018-01-07 ENCOUNTER — Other Ambulatory Visit: Payer: Self-pay | Admitting: Family Medicine

## 2018-01-07 NOTE — Telephone Encounter (Signed)
Per OV, 01/04/18, Dr. Darnell Level is going to discuss with pt's cardiologist about resuming med.

## 2018-01-08 ENCOUNTER — Inpatient Hospital Stay: Payer: Medicare Other | Admitting: Family Medicine

## 2018-01-09 ENCOUNTER — Other Ambulatory Visit: Payer: Self-pay | Admitting: Internal Medicine

## 2018-01-09 ENCOUNTER — Other Ambulatory Visit: Payer: Self-pay | Admitting: Family Medicine

## 2018-01-13 HISTORY — PX: COLONOSCOPY: SHX174

## 2018-01-15 ENCOUNTER — Ambulatory Visit: Payer: Medicare Other | Admitting: Cardiovascular Disease

## 2018-01-15 DIAGNOSIS — R194 Change in bowel habit: Secondary | ICD-10-CM | POA: Diagnosis not present

## 2018-01-17 ENCOUNTER — Ambulatory Visit (INDEPENDENT_AMBULATORY_CARE_PROVIDER_SITE_OTHER): Payer: Medicare Other | Admitting: Cardiovascular Disease

## 2018-01-17 ENCOUNTER — Encounter: Payer: Self-pay | Admitting: Cardiovascular Disease

## 2018-01-17 VITALS — BP 118/67 | HR 73 | Ht 65.0 in | Wt 180.5 lb

## 2018-01-17 DIAGNOSIS — I428 Other cardiomyopathies: Secondary | ICD-10-CM

## 2018-01-17 DIAGNOSIS — I10 Essential (primary) hypertension: Secondary | ICD-10-CM

## 2018-01-17 DIAGNOSIS — R55 Syncope and collapse: Secondary | ICD-10-CM

## 2018-01-17 DIAGNOSIS — I420 Dilated cardiomyopathy: Secondary | ICD-10-CM | POA: Diagnosis not present

## 2018-01-17 DIAGNOSIS — I5022 Chronic systolic (congestive) heart failure: Secondary | ICD-10-CM | POA: Diagnosis not present

## 2018-01-17 DIAGNOSIS — E782 Mixed hyperlipidemia: Secondary | ICD-10-CM | POA: Diagnosis not present

## 2018-01-17 MED ORDER — LOSARTAN POTASSIUM 25 MG PO TABS: 25.0000 mg | ORAL_TABLET | Freq: Every day | ORAL | 3 refills | Status: DC

## 2018-01-17 MED ORDER — SPIRONOLACTONE 25 MG PO TABS: 25.0000 mg | ORAL_TABLET | ORAL | 3 refills | Status: DC

## 2018-01-17 NOTE — Patient Instructions (Addendum)
Medication Instructions:   Restart losartan 25 mg daily Take the spironolactone  Every other day (M/W/Fri, Sat) Not too much torsemide  Labwork:  No new labs needed  Testing/Procedures:  No further testing at this time   Follow-Up: It was a pleasure seeing you in the office today. Please call us if you have new issues that need to be addressed before your next appt.  [phone]  Your physician wants you to follow-up in: 12 months.  You will receive a reminder letter in the mail two months in advance. If you don't receive a letter, please call our office to schedule the follow-up appointment.  If you need a refill on your cardiac medications before your next appointment, please call your pharmacy.  For educational health videos Log in to : [email or web address] Or : [email or web address], password : [email or web address]
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
## 2018-01-24 DIAGNOSIS — H16223 Keratoconjunctivitis sicca, not specified as Sjogren's, bilateral: Secondary | ICD-10-CM | POA: Diagnosis not present

## 2018-01-25 DIAGNOSIS — M8589 Other specified disorders of bone density and structure, multiple sites: Secondary | ICD-10-CM | POA: Diagnosis not present

## 2018-02-04 ENCOUNTER — Other Ambulatory Visit: Payer: Self-pay | Admitting: Family Medicine

## 2018-02-04 NOTE — Telephone Encounter (Signed)
Per 11/19/17 OV, pt was to take med for 3 wks then stop. Refill denied.

## 2018-02-05 DIAGNOSIS — K635 Polyp of colon: Secondary | ICD-10-CM | POA: Diagnosis not present

## 2018-02-05 DIAGNOSIS — R194 Change in bowel habit: Secondary | ICD-10-CM | POA: Diagnosis not present

## 2018-02-05 DIAGNOSIS — K529 Noninfective gastroenteritis and colitis, unspecified: Secondary | ICD-10-CM | POA: Diagnosis not present

## 2018-02-05 DIAGNOSIS — Z980 Intestinal bypass and anastomosis status: Secondary | ICD-10-CM | POA: Diagnosis not present

## 2018-02-05 DIAGNOSIS — K5730 Diverticulosis of large intestine without perforation or abscess without bleeding: Secondary | ICD-10-CM | POA: Diagnosis not present

## 2018-02-06 ENCOUNTER — Encounter: Payer: Self-pay | Admitting: Family Medicine

## 2018-02-07 ENCOUNTER — Encounter: Payer: Self-pay | Admitting: Family Medicine

## 2018-02-07 DIAGNOSIS — M810 Age-related osteoporosis without current pathological fracture: Secondary | ICD-10-CM | POA: Diagnosis not present

## 2018-02-07 DIAGNOSIS — Z124 Encounter for screening for malignant neoplasm of cervix: Secondary | ICD-10-CM | POA: Diagnosis not present

## 2018-02-07 DIAGNOSIS — Z1231 Encounter for screening mammogram for malignant neoplasm of breast: Secondary | ICD-10-CM | POA: Diagnosis not present

## 2018-02-11 ENCOUNTER — Encounter: Payer: Medicare Other | Admitting: Internal Medicine

## 2018-02-11 ENCOUNTER — Encounter: Payer: Self-pay | Admitting: Family Medicine

## 2018-02-14 ENCOUNTER — Ambulatory Visit (INDEPENDENT_AMBULATORY_CARE_PROVIDER_SITE_OTHER): Payer: Medicare Other | Admitting: Family Medicine

## 2018-02-14 ENCOUNTER — Ambulatory Visit: Payer: Self-pay | Admitting: *Deleted

## 2018-02-14 ENCOUNTER — Encounter: Payer: Self-pay | Admitting: Family Medicine

## 2018-02-14 VITALS — BP 140/68 | HR 73 | Temp 97.7°F | Ht 65.0 in | Wt 179.8 lb

## 2018-02-14 DIAGNOSIS — N3000 Acute cystitis without hematuria: Secondary | ICD-10-CM

## 2018-02-14 DIAGNOSIS — M545 Low back pain, unspecified: Secondary | ICD-10-CM

## 2018-02-14 DIAGNOSIS — N183 Chronic kidney disease, stage 3 unspecified: Secondary | ICD-10-CM

## 2018-02-14 DIAGNOSIS — N390 Urinary tract infection, site not specified: Secondary | ICD-10-CM | POA: Insufficient documentation

## 2018-02-14 DIAGNOSIS — M549 Dorsalgia, unspecified: Secondary | ICD-10-CM | POA: Insufficient documentation

## 2018-02-14 DIAGNOSIS — H9201 Otalgia, right ear: Secondary | ICD-10-CM

## 2018-02-14 DIAGNOSIS — M5440 Lumbago with sciatica, unspecified side: Secondary | ICD-10-CM | POA: Diagnosis not present

## 2018-02-14 LAB — RENAL FUNCTION PANEL
Albumin: 4.2 g/dL (ref 3.5–5.2)
BUN: 14 mg/dL (ref 6–23)
CO2: 32 mEq/L (ref 19–32)
Calcium: 9.8 mg/dL (ref 8.4–10.5)
Chloride: 103 mEq/L (ref 96–112)
Creatinine, Ser: 0.95 mg/dL (ref 0.40–1.20)
GFR: 61.36 mL/min (ref >60.00)
Glucose, Bld: 96 mg/dL (ref 70–99)
Phosphorus: 2.9 mg/dL (ref 2.3–4.6)
Potassium: 3.9 mEq/L (ref 3.5–5.1)
Sodium: 139 mEq/L (ref 135–145)

## 2018-02-14 LAB — POC URINALSYSI DIPSTICK (AUTOMATED)
Bilirubin, UA: NEGATIVE
Blood, UA: NEGATIVE
Glucose, UA: NEGATIVE
Ketones, UA: NEGATIVE
Nitrite, UA: NEGATIVE
Protein, UA: NEGATIVE
Spec Grav, UA: 1.02 (ref 1.010–1.025)
Urobilinogen, UA: 0.2 E.U./dL
pH, UA: 6 (ref 5.0–8.0)

## 2018-02-14 MED ORDER — LEVOFLOXACIN 250 MG PO TABS: 250.0000 mg | ORAL_TABLET | Freq: Every day | ORAL | 0 refills | Status: DC

## 2018-02-14 NOTE — Telephone Encounter (Signed)
Patient has been experiencing lower back pain, just above the kidneys, for 2 days. It began after a day of numerous watery diarrhea episodes on Tuesday.Stool today loose. Denies blood/mucous in stool. No difficulty voiding today. Denies fever/radiating pain. She called in to request labs to check for kidney disease. Advised appointment. No availability with PCP thru the [place]. Skyped FC for possible appointment. Will return call to patient. Routing note at this time to [facility] for advice. Reason for Disposition . [1] MODERATE back pain (e.g., interferes with normal activities) AND [2] present > 3 days  Answer Assessment - Initial Assessment Questions 1. ONSET: "When did the pain begin?"      2 days ago 2. LOCATION: "Where does it hurt?" (upper, mid or lower back)     Lower back just above kidneys 3. SEVERITY: "How bad is the pain?"  (e.g., Scale 1-10; mild, moderate, or severe)   - MILD (1-3): doesn't interfere with normal activities    - MODERATE (4-7): interferes with normal activities or awakens from sleep    - SEVERE (8-10): excruciating pain, unable to do any normal activities      moderte 4. PATTERN: "Is the pain constant?" (e.g., yes, no; constant, intermittent)      yes 5. RADIATION: "Does the pain shoot into your legs or elsewhere?"     no 6. CAUSE:  "What do you think is causing the back pain?"      Stated "it could be kidney disease" 7. BACK OVERUSE:  "Any recent lifting of heavy objects, strenuous work or exercise?"     no 8. MEDICATIONS: "What have you taken so far for the pain?" (e.g., nothing, acetaminophen, NSAIDS)     nothing 9. NEUROLOGIC SYMPTOMS: "Do you have any weakness, numbness, or problems with bowel/bladder control?"     no 10. OTHER SYMPTOMS: "Do you have any other symptoms?" (e.g., fever, abdominal pain, burning with urination, blood in urine)       No, loose stools yesterday 11. PREGNANCY: "Is there any chance you are pregnant?" (e.g., yes, no; LMP)        no  Protocols used: BACK PAIN-A-AH

## 2018-02-14 NOTE — Patient Instructions (Signed)
Take tylenol for your back pain   Take the levaquin for possible uti  We will do a culture and let you know when it returns   Make sure to drink lots of water   Labs today

## 2018-02-14 NOTE — Progress Notes (Signed)
Subjective:    Patient ID: Ashley [name], female    DOB: 06-25-1945, 72 y.o.   MRN: [id number]  HPI 72 yo pt of Dr Darnell Level here with back pain and diarrhea   Wt Readings from Last 3 Encounters:  02/14/18 179 lb 12 oz (81.5 kg)  01/17/18 180 lb 8 oz (81.9 kg)  01/04/18 179 lb (81.2 kg)   29.91 kg/m   Also R ear pain on and off   In aug  Did VQ scan to look for blood clot  She wonders if it caused "kidney dz"  Has pain across back and "whenever she gets kidney dz" she gets pain in this area  UA today - some wbc Frequent urination No dysuria   Also hx of sciatica   Results for orders placed or performed in visit on 02/14/18  POCT Urinalysis Dipstick (Automated)  Result Value Ref Range   Color, UA Yellow    Clarity, UA Clear    Glucose, UA Negative Negative   Bilirubin, UA Negative    Ketones, UA Negative    Spec Grav, UA 1.020 1.010 - 1.025   Blood, UA Negative    pH, UA 6.0 5.0 - 8.0   Protein, UA Negative Negative   Urobilinogen, UA 0.2 0.2 or 1.0 E.U./dL   Nitrite, UA Negative    Leukocytes, UA Moderate (2+) (A) Negative   *Note: Due to a large number of results and/or encounters for the requested time period, some results have not been displayed. A complete set of results can be found in Results Review.    "the only antibiotic that works for me is levaquin" She is pcn allergic and avoids cephalosporin Intol to sulfa     Back pain - occ rad to R leg  Has hx of sciatica    Had colonoscopy sept  Has had colon surgery  Goes between diarrhea/constipation  Most recently diarrhea  (loose stool this am) some cramps  No blood in stool    Urine cx was neg 8/19  H/of CKD stage 3 Lab Results  Component Value Date   CREATININE 1.18 (H) 12/30/2017   BUN 25 (H) 12/30/2017   NA 139 12/30/2017   K 4.5 12/30/2017   CL 107 12/30/2017   CO2 24 12/30/2017   Last renal US in march US Renal (Accession [id number]) (Order [id number])  Imaging  Date: 08/10/2017  Department: [facility] Released By: Janan Ridge Authorizing: [name], MD  Exam Information   Status Exam Begun  Exam Ended   Final [99] 08/10/2017 1:15 PM 08/10/2017 1:33 PM  PACS Images   Show images for US Renal  Study Result   CLINICAL DATA:  Follow-up of 9 mm right renal lesion seen on CT.  EXAM: RENAL / URINARY TRACT ULTRASOUND COMPLETE  COMPARISON:  CT 06/18/2017 and 02/09/2016.  FINDINGS: Right Kidney:  Length: 11.0 cm. No hydronephrosis. Normal renal cortical thickness for age. A lower pole 1.2 cm lesion is markedly hypoechoic with enhanced through transmission, including on image 11. The smaller right-sided renal lesions are not visualized.  Left Kidney:  Length: 11.4 cm. No hydronephrosis. Normal renal cortical thickness for age. No focal lesion identified.  Bladder:  Decompressed.  IMPRESSION: 1.2 cm lower pole right renal lesion is most consistent with a cyst or minimally complex cyst. Other smaller right renal lesions are not identified.  No hydronephrosis.   Electronically Signed   By: Abigail Miyamoto M.D.   On: 08/10/2017  15:13     Patient Active Problem List   Diagnosis Date Noted  . Back pain 02/14/2018  . UTI (urinary tract infection) 02/14/2018  . Mixed hyperlipidemia 01/17/2018  . Cervical lymphadenopathy 01/05/2018  . Plantar fasciitis, bilateral 12/13/2017  . Acquired renal cyst of right kidney 08/11/2017  . Unsteadiness on feet 07/24/2017  . Multiple rib fractures 06/19/2017  . 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 High total serum IgM 11/22/2016  . 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 Bleeding external hemorrhoids 10/31/2016  . LBBB (left bundle branch block) 07/11/2016  . Gastritis 06/12/2016  . Bladder wall thickening 02/16/2016  . Fecal incontinence   . Abdominal pain 11/22/2015  . Abdominal aortic atherosclerosis ([place]) 11/13/2015  . 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  .  Hoarseness 06/21/2015  . Hypersensitivity to pneumococcal vaccine 06/10/2015  . Medicare annual wellness visit, subsequent 06/08/2015  . Advanced care planning/counseling discussion 06/08/2015  . Intertrigo 04/06/2015  . Chronic daily headache 04/06/2015  . Neuropathy 04/06/2015  . Nonischemic cardiomyopathy ([place])   . Chest pain and shortness of breath 12/25/2014  . S/P colostomy takedown 02/10/2014  . ICD (implantable cardioverter-defibrillator) in place 10/23/2013  . H/O multiple allergies 10/10/2013  . History of diverticular abscess 09/25/2013  . Osteoporosis 01/13/2013  . Irregular heart beat 08/06/2012  . MDD (major depressive disorder), recurrent episode, moderate ([place]) 07/09/2012  . Chronic recurrent sinusitis 06/17/2012  . Vitamin D deficiency 03/05/2012  . Essential tremor 12/29/2011  . GERD (gastroesophageal reflux disease) 11/13/2011  . Allergic rhinitis 11/13/2011  . CKD (chronic kidney disease) stage 3, GFR 30-59 ml/min (HCC) 11/09/2011  . Epistaxis, recurrent 10/11/2011  . Orthostatic hypotension 04/30/2011  . Cardiomyopathy, dilated, nonischemic ([place]) 03/01/2011  . Mitral regurgitation 02/24/2011  . Chronic systolic CHF (congestive heart failure) ([place]) 02/24/2011  . Chronic bronchitis ([place]) 01/20/2011  . Syncope 11/02/2010  . Chronic fatigue and malaise 11/02/2010  . Anxiety state 08/18/2010  . 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  . Dyslipidemia 11/22/2006  . COMMON MIGRAINE 11/22/2006  . Irritable bowel syndrome 11/22/2006  . FIBROCYSTIC BREAST DISEASE 11/22/2006  . Osteoarthritis 11/22/2006  . Fibromyalgia 11/22/2006   Past Medical History:  Diagnosis Date  . Abdominal aortic atherosclerosis ([place]) 11/2015   by CT  . Allergy   . Anemia   . Anxiety   . Automatic implantable cardioverter-defibrillator in situ 2012   a. MDT single lead ICD.  Marland Kitchen Bronchiectasis    a. possibly mild, treat URIs aggressively  . Cancer (HCC)    basal cell, arms & neck  .  Chronic sinusitis   . Depression   . Diarrhea 07/14/2008   Qualifier: Diagnosis of  By: Glori Bickers MD, Carmell Austria   . Disorder of vocal cords   . Essential hypertension   . Fibrocystic breast disease   . Fibromyalgia   . GERD (gastroesophageal reflux disease)   . H/O multiple allergies 10/10/2013  . Hemorrhoids   . History of diverticulitis of colon   . History of pneumonia 2012  . Insomnia   . Irritable bowel syndrome   . Laryngeal nodule    Dr. Thomasena Edis  . Lung disease 07/14/11  . Migraine without aura, without mention of intractable migraine without mention of status migrainosus   . Mitral regurgitation   . Nonischemic cardiomyopathy ([place])    a. Recovered LV fxn - prev as low as 25%, now 50-55% by echo 04/2014;  b. 2012 s/p MDT [id number] [place] VR single lead ICD.  Marland Kitchen Osteoarthritis    knees,  back, hands, low back,   . Osteoporosis 01/2013   T-2.7 spine (01/2016)  . Pneumonia 2011  . PONV (postoperative nausea and vomiting)   . Pre-syncope   . sigmoid diverticulitis with perforation, abscess and fistula 09/30/2013  . Sleep apnea 2010   borderline, inconclusive- /w [place]    Past Surgical History:  Procedure Laterality Date  . ABDOMINAL HYSTERECTOMY  1987   w/BSO  . ANAL RECTAL MANOMETRY N/A 02/02/2016   Procedure: ANO RECTAL MANOMETRY;  Surgeon: Mauri Pole, MD;  Location: [facility];  Service: Endoscopy;  Laterality: N/A;  . APPENDECTOMY    . AUGMENTATION MAMMAPLASTY    . breast cystectomies     due to FCBD  . BREAST MASS EXCISION  01/2010   Left-fibrocystic change w/intraductal papilloma, no malignancy  . carotid US  01/3266   1-24% LICA, normal RICA  . [employer] Bunionectomy  11/04/08   Right Great Toe (Dr. Beola Cord)  . COLONOSCOPY  01/2018   1 small benign polyp, diverticulosis, healthy anastomosis [name] @ [facility])  . COLOSTOMY N/A 10/02/2013   DESCENDING COLOSTOMY;  Surgeon: Imogene Burn. Tsuei, [place] N/A 02/10/2014   [name], MD  . EMG/MCV  10/16/01    + mild carpal tunnel  . ESOPHAGOGASTRODUODENOSCOPY  09/2014   small HH, irregular Z line, fundic gland polyps with mild gastritis ([place])  . Flex laryngoscopy  06/11/06   ([place]) nml  . IMPLANTABLE CARDIOVERTER DEFIBRILLATOR IMPLANT N/A 07/14/2011   MDT single chamber ICD  . KNEE ARTHROSCOPY W/ ORIF     Left; "meniscus tear"  . LASIK     left eye  . MANDIBLE SURGERY     "got about 6 pins in the bottom of my jaw"  . NCS  11/2014   L ulnar neuropathy, mild B median nerve entrapment ([place])  . OSTEOTOMY     Left foot  . PARTIAL COLECTOMY N/A 10/02/2013   PARTIAL COLECTOMY;  Surgeon: Imogene Burn. Tsuei, MD  . sinus surgery     x3 with balloon   . TOE AMPUTATION     "toe beside baby toe on left foot; got gangrene from corn"  . TONSILLECTOMY AND ADENOIDECTOMY     Social History   Tobacco Use  . Smoking status: Never Smoker  . Smokeless tobacco: Never Used  . Tobacco comment: Lived with smokers x 23 yrs, smoked herself "for a week"  Substance Use Topics  . Alcohol use: No    Alcohol/week: 0.0 standard drinks  . Drug use: No   Family History  Problem Relation Age of Onset  . Lung cancer Father        + smoker  . Colon polyps Father   . Dementia Mother   . Osteoporosis Mother        Lumbar spine  . Stroke Mother        x 5 @ 52 YOA  . Arthritis Mother        hands  . Emphysema Mother   . Alcohol abuse Paternal Grandfather   . Stroke Paternal Grandfather        ?  Marland Kitchen Heart disease Paternal Grandfather   . Hypertension Maternal Grandfather        ?  Marland Kitchen Heart disease Paternal Grandmother   . Colon cancer Neg Hx   . Esophageal cancer Neg Hx   . Gallbladder disease Neg Hx    Allergies  Allergen Reactions  . Antihistamines, Chlorpheniramine-Type Other (See Comments)  Alters vision  . Codeine Other (See Comments)    Confusion and dizziness  . Cyclobenzaprine Other (See Comments)    Adverse reaction - back and throat pain, dizziness, exhaustion  . Cymbalta [Duloxetine  Hcl] Other (See Comments)    "body spasms and made me feel weird in my head"   . Influenza Vac Split [Flu Virus Vaccine] Other (See Comments)    "allergic to concentrated eggs that are in vaccine"  . Prevnar [Pneumococcal 13-Val Conj Vacc] Other (See Comments)    Egg allergy Bad reaction after shot  . Saline Rash    FEVER, BURNING  . Sulfasalazine Other (See Comments)    "makes my whole body smell like sulfa & makes me sick just smelling it"  . Atorvastatin Other (See Comments)    Severe muscle cramps to generic atorvastatin.  Able to take brand lipitor.  . Chlorhexidine Other (See Comments)    blisters  . Dilaudid [Hydromorphone Hcl] Other (See Comments)    Feels like she is burning internally   . Latex Nausea Only, Rash and Other (See Comments)    Pain and infection  . Morphine And Related Other (See Comments)    Internally feels like she is on fire   . Pravastatin Other (See Comments)    myalgias  . Prevacid [Lansoprazole] Other (See Comments)    Worsened GI side effects  . Red Dye Nausea Only and Swelling  . Rosuvastatin Other (See Comments)    Was not effective controlling lipids  . Simvastatin Other (See Comments)    Leg cramps  . Diflucan [Fluconazole] Nausea And Vomiting  . Onion     Diarrhea, GI distress, flared IBS  . Other     NOTE: pt is able to take cephalosporins without reaction  . Penicillins Other (See Comments)    CHILDHOOD ALLEGY/REACTION: "told 50 years ago I couldn't take it; might be immune to it", able to take amoxicillin Has patient had a PCN reaction causing immediate rash, facial/tongue/throat swelling, SOB or lightheadedness with hypotension: Unknown Has patient had a PCN reaction causing severe rash involving mucus membranes or skin necrosis: UnknowN Has patient had a PCN reaction that required hospitalization: /Unknown Has patient had a PCN reaction occurring within the last 10 years: Unknown If a  . Sertraline Other (See Comments)    Overly  sedating  . Tape Other (See Comments)    All adhesives  - Blisters, itching and burning   . Metaxalone Other (See Comments)    REACTION: unknown   Current Outpatient Medications on File Prior to Visit  Medication Sig Dispense Refill  . acetaminophen (TYLENOL) 500 MG tablet Take 500 mg by mouth daily as needed (PAIN).    Marland Kitchen albuterol (PROVENTIL HFA;VENTOLIN HFA) 108 (90 Base) MCG/ACT inhaler TAKE 2 PUFFS BY MOUTH EVERY 6 HOURS AS NEEDED FOR WHEEZE OR SHORTNESS OF BREATH 18 Inhaler 1  . AMBULATORY NON FORMULARY MEDICATION Medication Name:  1 squatty potty to use as needed 1 each 0  . Ascorbic Acid (VITAMIN C) 1000 MG tablet Take 1,000 mg by mouth daily.    Marland Kitchen aspirin EC 81 MG tablet Take 81 mg by mouth at bedtime.     Marland Kitchen BIOTIN PO Take 1 tablet by mouth daily.    . Calcium Carbonate-Vitamin D (CALCIUM + D PO) Take 1 tablet by mouth daily.    . carvedilol (COREG) 6.25 MG tablet TAKE 1 TABLET (6.25 MG TOTAL) BY MOUTH 2 (TWO) TIMES DAILY WITH A MEAL. 180 tablet  3  . cholecalciferol (VITAMIN D) 1000 units tablet Take 2,000 Units by mouth daily.    . Coenzyme Q10 (CO Q 10 PO) Take 1 tablet by mouth daily.    Marland Kitchen dextromethorphan (DELSYM) 30 MG/5ML liquid Take 15 mg by mouth at bedtime as needed for cough.    . ezetimibe (ZETIA) 10 MG tablet TAKE 1 TABLET (10 MG TOTAL) BY MOUTH DAILY. (Patient taking differently: Take 10 mg by mouth at bedtime. ) 30 tablet 5  . GARLIC PO Take 1 tablet by mouth daily.    Marland Kitchen guaiFENesin (MUCINEX) 600 MG 12 hr tablet Take 600 mg by mouth daily.    Marland Kitchen levalbuterol (XOPENEX HFA) 45 MCG/ACT inhaler Inhale 2 puffs into the lungs every 4 (four) hours as needed for wheezing or shortness of breath. 1 Inhaler 3  . losartan (COZAAR) 25 MG tablet Take 1 tablet (25 mg total) by mouth daily. (Patient taking differently: Take 25 mg by mouth. Takes Monday, Wednesday, and Friday and Saturday) 90 tablet 3  . nystatin cream (MYCOSTATIN) Apply 1 application topically 2 (two) times daily. 60 g  1  . omega-3 acid ethyl esters (LOVAZA) 1 g capsule Take 1 g by mouth 2 (two) times daily.    Marland Kitchen omeprazole (PRILOSEC) 20 MG capsule TAKE 1 CAPSULE BY MOUTH EVERY DAY 30 capsule 1  . potassium chloride (K-DUR) 10 MEQ tablet Take 1 tablet (10 mEq total) by mouth daily as needed (with torsemide).    . Pyridoxine HCl (VITAMIN B6 PO) Take 1 capsule by mouth daily. Reported on 07/12/2015    . Red Yeast Rice 600 MG CAPS Take 1 capsule (600 mg total) by mouth 2 (two) times daily.    . RESTASIS 0.05 % ophthalmic emulsion Place 1 drop into both eyes 2 (two) times daily.    Marland Kitchen spironolactone (ALDACTONE) 25 MG tablet Take 1 tablet (25 mg total) by mouth every other day. (Patient taking differently: Take 25 mg by mouth every other day. Monday, Wednesday, Friday and Saturday) 45 tablet 3  . tiotropium (SPIRIVA HANDIHALER) 18 MCG inhalation capsule Place 1 capsule (18 mcg total) into inhaler and inhale daily. 30 capsule 11  . torsemide (DEMADEX) 10 MG tablet Take 1 tablet (10 mg total) by mouth daily as needed (swelling).    . vitamin E 100 UNIT capsule Take 100 Units by mouth daily.    . [DISCONTINUED] bisoprolol (ZEBETA) 5 MG tablet Take 5 mg by mouth daily.     No current facility-administered medications on file prior to visit.     Review of Systems  Constitutional: Positive for fatigue. Negative for activity change, appetite change, fever and unexpected weight change.  HENT: Positive for postnasal drip. Negative for congestion, ear discharge, ear pain, rhinorrhea, sinus pressure and sore throat.   Eyes: Negative for pain, redness and visual disturbance.  Respiratory: Negative for cough, shortness of breath and wheezing.   Cardiovascular: Negative for chest pain and palpitations.  Gastrointestinal: Positive for diarrhea. Negative for abdominal pain, blood in stool, constipation, nausea and vomiting.  Endocrine: Negative for polydipsia and polyuria.  Genitourinary: Positive for frequency. Negative for  dysuria and urgency.  Musculoskeletal: Positive for back pain. Negative for arthralgias, joint swelling and myalgias.  Skin: Negative for pallor and rash.  Allergic/Immunologic: Negative for environmental allergies.  Neurological: Negative for dizziness, syncope and headaches.  Hematological: Negative for adenopathy. Does not bruise/bleed easily.  Psychiatric/Behavioral: Negative for decreased concentration and dysphoric mood. The patient is not nervous/anxious.  Objective:   Physical Exam  Constitutional: She appears well-developed and well-nourished. No distress.  Well appearing   HENT:  Head: Normocephalic and atraumatic.  Right Ear: External ear normal.  Left Ear: External ear normal.  R TM- dull/ no erythema Scant cerumen  Eyes: Pupils are equal, round, and reactive to light. Conjunctivae and EOM are normal. Right eye exhibits no discharge. Left eye exhibits no discharge. No scleral icterus.  Neck: Normal range of motion. Neck supple.  Cardiovascular: Normal rate, regular rhythm and normal heart sounds.  Pulmonary/Chest: Effort normal and breath sounds normal. No stridor. No respiratory distress. She has no wheezes.  Abdominal: Soft. Bowel sounds are normal. She exhibits no distension and no mass. There is no tenderness. There is no rebound and no guarding.  Musculoskeletal:       Lumbar back: She exhibits decreased range of motion and tenderness. She exhibits no edema and no deformity.  Tight lumbar musculature bilaterally Pain with 30 deg of flexion and any degree of extension  Gait is slow and gaurded No CVA tenderness  Some low back pain with SLR or either leg  Lymphadenopathy:    She has no cervical adenopathy.  Neurological: She is alert. No cranial nerve deficit. Coordination normal.  Skin: Skin is warm and dry. No rash noted. No erythema. No pallor.  Psychiatric:  Mildly anxious- is very worried about her kidney function           Assessment & Plan:    Problem List Items Addressed This Visit      Genitourinary   CKD (chronic kidney disease) stage 3, GFR 30-59 ml/min (HCC)    Pt states that her back usually hurts when her kidney function decreases  Will check renal panel today  Also wbc in urine- pending urine culture  Enc a good water intake       Relevant Orders   Renal function panel (Completed)   UTI (urinary tract infection)    ua with wbc/ some frequency and back pain  Pt states levaquin is "the only medicine that works" or that she can take  cx pending tx with 3 d of low dose levaquin Enc water intake       Relevant Orders   Urine Culture (Completed)     Other   Back pain - Primary    Across lower back  Pt thinks this is from kidney issues but pain seems to be more muscular/postional  Lab and urine studies today  Enc analgesic and warm compress  Will update       Relevant Orders   POCT Urinalysis Dipstick (Automated) (Completed)   Right ear pain    On and off  Exam consistent with eustachian dysfunction (no redness)  Pt states she has ENT f/u pending

## 2018-02-14 NOTE — Telephone Encounter (Signed)
I spoke with pt and scheduled 30' appt with Dr Glori Bickers 02/14/18 at 11:15. Pt said she is having lower back pain on both sides; no UTI symptoms.FYi to Dr Glori Bickers.

## 2018-02-14 NOTE — Telephone Encounter (Signed)
I will see her then  

## 2018-02-15 LAB — URINE CULTURE
ISOLATE 1:: NO GROWTH
MICRO NUMBER:: 91189933
SPECIMEN QUALITY:: ADEQUATE

## 2018-02-16 DIAGNOSIS — H9201 Otalgia, right ear: Secondary | ICD-10-CM | POA: Insufficient documentation

## 2018-02-16 NOTE — Assessment & Plan Note (Signed)
Pt states that her back usually hurts when her kidney function decreases  Will check renal panel today  Also wbc in urine- pending urine culture  Enc a good water intake

## 2018-02-16 NOTE — Assessment & Plan Note (Signed)
On and off  Exam consistent with eustachian dysfunction (no redness)  Pt states she has ENT f/u pending

## 2018-02-16 NOTE — Assessment & Plan Note (Signed)
Across lower back  Pt thinks this is from kidney issues but pain seems to be more muscular/postional  Lab and urine studies today  Enc analgesic and warm compress  Will update

## 2018-02-16 NOTE — Assessment & Plan Note (Signed)
ua with wbc/ some frequency and back pain  Pt states levaquin is "the only medicine that works" or that she can take  cx pending tx with 3 d of low dose levaquin Enc water intake

## 2018-02-18 ENCOUNTER — Ambulatory Visit (INDEPENDENT_AMBULATORY_CARE_PROVIDER_SITE_OTHER): Payer: Medicare Other | Admitting: *Deleted

## 2018-02-18 ENCOUNTER — Telehealth: Payer: Self-pay

## 2018-02-18 DIAGNOSIS — I420 Dilated cardiomyopathy: Secondary | ICD-10-CM | POA: Diagnosis not present

## 2018-02-18 DIAGNOSIS — I5022 Chronic systolic (congestive) heart failure: Secondary | ICD-10-CM

## 2018-02-18 NOTE — Telephone Encounter (Signed)
Spoke with pt and reminded pt of remote transmission that is due today. Pt verbalized understanding.   

## 2018-02-19 DIAGNOSIS — R682 Dry mouth, unspecified: Secondary | ICD-10-CM | POA: Diagnosis not present

## 2018-02-19 DIAGNOSIS — M26609 Unspecified temporomandibular joint disorder, unspecified side: Secondary | ICD-10-CM | POA: Diagnosis not present

## 2018-02-19 DIAGNOSIS — H6123 Impacted cerumen, bilateral: Secondary | ICD-10-CM | POA: Diagnosis not present

## 2018-02-19 DIAGNOSIS — K219 Gastro-esophageal reflux disease without esophagitis: Secondary | ICD-10-CM | POA: Diagnosis not present

## 2018-02-19 DIAGNOSIS — H903 Sensorineural hearing loss, bilateral: Secondary | ICD-10-CM | POA: Diagnosis not present

## 2018-02-20 NOTE — Progress Notes (Signed)
Remote ICD transmission.   

## 2018-02-27 ENCOUNTER — Encounter: Payer: Self-pay | Admitting: Cardiology

## 2018-03-05 DIAGNOSIS — L820 Inflamed seborrheic keratosis: Secondary | ICD-10-CM | POA: Diagnosis not present

## 2018-03-05 DIAGNOSIS — L570 Actinic keratosis: Secondary | ICD-10-CM | POA: Diagnosis not present

## 2018-03-12 LAB — CUP PACEART REMOTE DEVICE CHECK
Battery Voltage: 2.84 V
Brady Statistic RV Percent Paced: 0.01 %
Date Time Interrogation Session: 20191007182318
HighPow Impedance: 81 Ohm
Implantable Lead Implant Date: 20130301
Implantable Lead Location: 753860
Implantable Lead Model: 6935
Implantable Pulse Generator Implant Date: 20130301
Lead Channel Impedance Value: 627 Ohm
Lead Channel Pacing Threshold Amplitude: 0.5 V
Lead Channel Pacing Threshold Pulse Width: 0.4 ms
Lead Channel Sensing Intrinsic Amplitude: 5.875 mV
Lead Channel Sensing Intrinsic Amplitude: 5.875 mV
Lead Channel Setting Pacing Amplitude: 2.5 V
Lead Channel Setting Pacing Pulse Width: 0.4 ms
Lead Channel Setting Sensing Sensitivity: 0.3 mV

## 2018-04-01 DIAGNOSIS — D472 Monoclonal gammopathy: Secondary | ICD-10-CM | POA: Diagnosis not present

## 2018-04-01 DIAGNOSIS — R748 Abnormal levels of other serum enzymes: Secondary | ICD-10-CM | POA: Diagnosis not present

## 2018-04-01 DIAGNOSIS — R768 Other specified abnormal immunological findings in serum: Secondary | ICD-10-CM | POA: Diagnosis not present

## 2018-04-01 DIAGNOSIS — D804 Selective deficiency of immunoglobulin M [IgM]: Secondary | ICD-10-CM | POA: Diagnosis not present

## 2018-04-05 ENCOUNTER — Encounter: Payer: Self-pay | Admitting: Family Medicine

## 2018-04-05 ENCOUNTER — Ambulatory Visit (INDEPENDENT_AMBULATORY_CARE_PROVIDER_SITE_OTHER): Payer: Medicare Other | Admitting: Family Medicine

## 2018-04-05 VITALS — BP 134/70 | HR 81 | Temp 97.8°F | Ht 65.0 in | Wt 180.6 lb

## 2018-04-05 DIAGNOSIS — K1379 Other lesions of oral mucosa: Secondary | ICD-10-CM | POA: Diagnosis not present

## 2018-04-05 DIAGNOSIS — R05 Cough: Secondary | ICD-10-CM | POA: Diagnosis not present

## 2018-04-05 DIAGNOSIS — J418 Mixed simple and mucopurulent chronic bronchitis: Secondary | ICD-10-CM | POA: Diagnosis not present

## 2018-04-05 DIAGNOSIS — R059 Cough, unspecified: Secondary | ICD-10-CM

## 2018-04-05 MED ORDER — FIRST-DUKES MOUTHWASH MT SUSP: OROMUCOSAL | 0 refills | Status: DC

## 2018-04-05 MED ORDER — LEVOFLOXACIN 500 MG PO TABS: 500.0000 mg | ORAL_TABLET | Freq: Every day | ORAL | 0 refills | Status: DC

## 2018-04-05 NOTE — Patient Instructions (Signed)
It was very nice to see you today!  I will send in a mouthwash and levaquin.  Please let me or your primary know if your symptoms worsen or do not improve in the next 5-7 days.   Take care, Dr Jerline Pain

## 2018-04-05 NOTE — Progress Notes (Signed)
   Subjective:  Ashley [name] is a 72 y.o. female who presents today for same-day appointment with a chief complaint of cough.   HPI:  Cough, Acute problem Started about 3 days ago. Worsening over that time.  Her cough has been productive of yellow sputum.  She has had some associated shortness of breath, wheezing, rhinorrhea, sore throat, oral ulcers, and nosebleeds.  She also has had sore salivary glands however reports that this is been a problem for almost 10 years.  She has used her home inhalers with modest improvement in her symptoms.  Symptoms are worse at night in the morning.  No sick contacts.  No other obvious alleviating or aggravating factors.  ROS: Per HPI  PMH: She reports that she has never smoked. She has never used smokeless tobacco. She reports that she does not drink alcohol or use drugs.  Objective:  Physical Exam: BP 134/70 (BP Location: Left Arm, Patient Position: Sitting, Cuff Size: Normal)   Pulse 81   Temp 97.8 F (36.6 C) (Oral)   Ht 5\' 5"  (1.651 m)   Wt 180 lb 9.6 oz (81.9 kg)   SpO2 97%   BMI 30.05 kg/m   Gen: NAD, resting comfortably HEENT: TMs clear.  Nasal mucosa erythematous and boggy with dried blood noted bilaterally.  Oropharynx slightly erythematous without exudate.  Oral mucosa with scattered shallow ulcers. CV: RRR with no murmurs appreciated Pulm: NWOB, a few scattered wheezes and rhonchi noted, otherwise clear.  Assessment/Plan:  Cough / COPD / Oral Ulcers Patient with mild COPD exacerbation.  Her respiratory exam is overall stable.  She has an extensive allergy list, limiting treatment options.  We will send in a course of Levaquin and Magic mouthwash as this has worked well for her past exacerbations.  Recommended continued use of home inhalers.  Recommended good oral hydration.  Discussed reasons to return to care.  Follow-up as needed.  Algis Greenhouse. Jerline Pain, MD 04/05/2018 1:27 PM

## 2018-04-10 ENCOUNTER — Other Ambulatory Visit: Payer: Self-pay | Admitting: Family Medicine

## 2018-04-16 ENCOUNTER — Other Ambulatory Visit: Payer: Self-pay | Admitting: Family Medicine

## 2018-04-24 DIAGNOSIS — H16223 Keratoconjunctivitis sicca, not specified as Sjogren's, bilateral: Secondary | ICD-10-CM | POA: Diagnosis not present

## 2018-05-21 ENCOUNTER — Ambulatory Visit (INDEPENDENT_AMBULATORY_CARE_PROVIDER_SITE_OTHER): Payer: Medicare Other

## 2018-05-21 DIAGNOSIS — I420 Dilated cardiomyopathy: Secondary | ICD-10-CM

## 2018-05-21 DIAGNOSIS — I5022 Chronic systolic (congestive) heart failure: Secondary | ICD-10-CM

## 2018-05-22 DIAGNOSIS — H01003 Unspecified blepharitis right eye, unspecified eyelid: Secondary | ICD-10-CM | POA: Diagnosis not present

## 2018-05-22 NOTE — Progress Notes (Signed)
Remote ICD transmission.   

## 2018-05-23 LAB — CUP PACEART REMOTE DEVICE CHECK
Battery Voltage: 2.8 V
Brady Statistic RV Percent Paced: 0.01 %
Date Time Interrogation Session: 20200106132626
HighPow Impedance: 75 Ohm
Implantable Lead Implant Date: 20130301
Implantable Lead Location: 753860
Implantable Lead Model: 6935
Implantable Pulse Generator Implant Date: 20130301
Lead Channel Impedance Value: 475 Ohm
Lead Channel Pacing Threshold Amplitude: 0.625 V
Lead Channel Pacing Threshold Pulse Width: 0.4 ms
Lead Channel Sensing Intrinsic Amplitude: 5.625 mV
Lead Channel Sensing Intrinsic Amplitude: 5.625 mV
Lead Channel Setting Pacing Amplitude: 2.5 V
Lead Channel Setting Pacing Pulse Width: 0.4 ms
Lead Channel Setting Sensing Sensitivity: 0.3 mV

## 2018-05-29 DIAGNOSIS — L570 Actinic keratosis: Secondary | ICD-10-CM | POA: Diagnosis not present

## 2018-05-29 DIAGNOSIS — B372 Candidiasis of skin and nail: Secondary | ICD-10-CM | POA: Diagnosis not present

## 2018-05-29 DIAGNOSIS — D485 Neoplasm of uncertain behavior of skin: Secondary | ICD-10-CM | POA: Diagnosis not present

## 2018-05-29 DIAGNOSIS — L853 Xerosis cutis: Secondary | ICD-10-CM | POA: Diagnosis not present

## 2018-05-31 ENCOUNTER — Ambulatory Visit (INDEPENDENT_AMBULATORY_CARE_PROVIDER_SITE_OTHER)
Admission: RE | Admit: 2018-05-31 | Discharge: 2018-05-31 | Disposition: A | Discharge status: Home / Self Care | Payer: Medicare Other | Source: Ambulatory Visit | Attending: Family Medicine | Admitting: Family Medicine

## 2018-05-31 ENCOUNTER — Encounter: Payer: Self-pay | Admitting: Family Medicine

## 2018-05-31 ENCOUNTER — Ambulatory Visit (INDEPENDENT_AMBULATORY_CARE_PROVIDER_SITE_OTHER): Payer: Medicare Other | Admitting: Family Medicine

## 2018-05-31 VITALS — BP 120/72 | HR 79 | Temp 97.6°F | Ht 65.0 in | Wt 178.8 lb

## 2018-05-31 DIAGNOSIS — R140 Abdominal distension (gaseous): Secondary | ICD-10-CM

## 2018-05-31 DIAGNOSIS — G629 Polyneuropathy, unspecified: Secondary | ICD-10-CM

## 2018-05-31 DIAGNOSIS — K582 Mixed irritable bowel syndrome: Secondary | ICD-10-CM

## 2018-05-31 DIAGNOSIS — M797 Fibromyalgia: Secondary | ICD-10-CM

## 2018-05-31 DIAGNOSIS — R5382 Chronic fatigue, unspecified: Secondary | ICD-10-CM | POA: Diagnosis not present

## 2018-05-31 DIAGNOSIS — D803 Selective deficiency of immunoglobulin G [IgG] subclasses: Secondary | ICD-10-CM | POA: Diagnosis not present

## 2018-05-31 DIAGNOSIS — M7061 Trochanteric bursitis, right hip: Secondary | ICD-10-CM

## 2018-05-31 DIAGNOSIS — Z8719 Personal history of other diseases of the digestive system: Secondary | ICD-10-CM

## 2018-05-31 DIAGNOSIS — G8929 Other chronic pain: Secondary | ICD-10-CM | POA: Diagnosis not present

## 2018-05-31 DIAGNOSIS — R197 Diarrhea, unspecified: Secondary | ICD-10-CM

## 2018-05-31 DIAGNOSIS — R5381 Other malaise: Secondary | ICD-10-CM

## 2018-05-31 DIAGNOSIS — N183 Chronic kidney disease, stage 3 unspecified: Secondary | ICD-10-CM

## 2018-05-31 DIAGNOSIS — R200 Anesthesia of skin: Secondary | ICD-10-CM

## 2018-05-31 DIAGNOSIS — G6289 Other specified polyneuropathies: Secondary | ICD-10-CM | POA: Diagnosis not present

## 2018-05-31 DIAGNOSIS — R1084 Generalized abdominal pain: Secondary | ICD-10-CM | POA: Diagnosis not present

## 2018-05-31 DIAGNOSIS — R768 Other specified abnormal immunological findings in serum: Secondary | ICD-10-CM | POA: Diagnosis not present

## 2018-05-31 LAB — COMPREHENSIVE METABOLIC PANEL
ALT: 21 U/L (ref 0–35)
AST: 21 U/L (ref 0–37)
Albumin: 4.5 g/dL (ref 3.5–5.2)
Alkaline Phosphatase: 95 U/L (ref 39–117)
BUN: 20 mg/dL (ref 6–23)
CO2: 28 mEq/L (ref 19–32)
Calcium: 10.5 mg/dL (ref 8.4–10.5)
Chloride: 103 mEq/L (ref 96–112)
Creatinine, Ser: 0.93 mg/dL (ref 0.40–1.20)
GFR: 59.12 mL/min — ABNORMAL LOW (ref >60.00)
Glucose, Bld: 81 mg/dL (ref 70–99)
Potassium: 4 mEq/L (ref 3.5–5.1)
Sodium: 140 mEq/L (ref 135–145)
Total Bilirubin: 0.4 mg/dL (ref 0.2–1.2)
Total Protein: 8.1 g/dL (ref 6.0–8.3)

## 2018-05-31 LAB — CBC WITH DIFFERENTIAL/PLATELET
Basophils Absolute: 0.1 10*3/uL (ref 0.0–0.1)
Basophils Relative: 0.9 % (ref 0.0–3.0)
Eosinophils Absolute: 0.1 10*3/uL (ref 0.0–0.7)
Eosinophils Relative: 1.1 % (ref 0.0–5.0)
HCT: 41.3 % (ref 36.0–46.0)
Hemoglobin: 13.5 g/dL (ref 12.0–15.0)
Lymphocytes Relative: 22.9 % (ref 12.0–46.0)
Lymphs Abs: 2.2 10*3/uL (ref 0.7–4.0)
MCHC: 32.7 g/dL (ref 30.0–36.0)
MCV: 89.9 fl (ref 78.0–100.0)
Monocytes Absolute: 0.9 10*3/uL (ref 0.1–1.0)
Monocytes Relative: 9.6 % (ref 3.0–12.0)
Neutro Abs: 6.3 10*3/uL (ref 1.4–7.7)
Neutrophils Relative %: 65.5 % (ref 43.0–77.0)
Platelets: 256 10*3/uL (ref 150.0–400.0)
RBC: 4.59 Mil/uL (ref 3.87–5.11)
RDW: 13 % (ref 11.5–15.5)
WBC: 9.7 10*3/uL (ref 4.0–10.5)

## 2018-05-31 LAB — POC URINALSYSI DIPSTICK (AUTOMATED)
Bilirubin, UA: NEGATIVE
Blood, UA: NEGATIVE
Glucose, UA: NEGATIVE
Ketones, UA: NEGATIVE
Leukocytes, UA: NEGATIVE
Nitrite, UA: NEGATIVE
Protein, UA: NEGATIVE
Spec Grav, UA: 1.015 (ref 1.010–1.025)
Urobilinogen, UA: 0.2 E.U./dL
pH, UA: 6 (ref 5.0–8.0)

## 2018-05-31 LAB — FOLATE: Folate: 23.3 ng/mL (ref >5.9)

## 2018-05-31 LAB — TSH: TSH: 1.18 u[IU]/mL (ref 0.35–4.50)

## 2018-05-31 LAB — VITAMIN B12: Vitamin B-12: 941 pg/mL — ABNORMAL HIGH (ref 211–911)

## 2018-05-31 MED ORDER — TORSEMIDE 10 MG PO TABS: 10.0000 mg | ORAL_TABLET | Freq: Every day | ORAL | 0 refills | Status: DC | PRN

## 2018-05-31 MED ORDER — VITAMIN B-12 1000 MCG PO TABS: 1000.0000 ug | ORAL_TABLET | Freq: Every day | ORAL | Status: DC

## 2018-05-31 NOTE — Progress Notes (Signed)
BP 120/72 (BP Location: Left Arm, Patient Position: Sitting, Cuff Size: Normal)   Pulse 79   Temp 97.6 F (36.4 C) (Oral)   Ht 5\' 5"  (1.651 m)   Wt 178 lb 12 oz (81.1 kg)   SpO2 97%   BMI 29.75 kg/m    CC: discuss colon concerns Subjective:    Patient ID: Ashley [name], female    DOB: [birth year], 73 y.o.   MRN: [id number]  HPI: Ashley [name] is a 73 y.o. female presenting on 05/31/2018 for Diarrhea (C/o severe diarrhea that causes LEs to go numb for the past yr. Sxs have worsened. )   I last saw patient 12/2017.   Continues alternating diarrhea/constipation, more recently diarrhea. Notes numbness to bilateral feet when she has diarrhea. More recently noticing progressive numbness of entire legs from hips to feet with each diarrheal episode. 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 Diarrhea described as soft but formed, at times watery. No blood or mucous. Not oily/greasy stools. Diarrhea can wake her up. No frequent muscle cramps of legs.   Ongoing bloating and distension L>R lower abdomen  Ongoing intermittent lower back pain.   Doesn't stay on commode more than a few minutes.   Saw [facility] - cymbalta caused nausea. f/u PRN.  Saw [facility] Dr Yves Dill - transitioned to FODMAP diet - with improvement in diarrhea and abd pain. Has noted main problem with onions. S/p colonoscopy. She stopped omeprazole 1+ yr ago.   Continued yeast infection lower abd pannus and perianal region - treating with nystatin and miconazole powder - saw derm yesterday, started on oral antifungal (diflucan).      Relevant past medical, surgical, family and social history reviewed and updated as indicated. Interim medical history since our last visit reviewed. Allergies and medications reviewed and updated. Outpatient Medications Prior to Visit  Medication Sig Dispense Refill  . acetaminophen (TYLENOL) 500 MG tablet Take 500 mg by mouth  daily as needed (PAIN).    Marland Kitchen albuterol (PROVENTIL HFA;VENTOLIN HFA) 108 (90 Base) MCG/ACT inhaler TAKE 2 PUFFS BY MOUTH EVERY 6 HOURS AS NEEDED FOR WHEEZE OR SHORTNESS OF BREATH 18 Inhaler 1  . AMBULATORY NON FORMULARY MEDICATION Medication Name:  1 squatty potty to use as needed 1 each 0  . Ascorbic Acid (VITAMIN C) 1000 MG tablet Take 1,000 mg by mouth daily.    Marland Kitchen aspirin EC 81 MG tablet Take 81 mg by mouth at bedtime.     Marland Kitchen BIOTIN PO Take 1 tablet by mouth daily.    . Calcium Carbonate-Vitamin D (CALCIUM + D PO) Take 1 tablet by mouth daily.    . carvedilol (COREG) 6.25 MG tablet TAKE 1 TABLET (6.25 MG TOTAL) BY MOUTH 2 (TWO) TIMES DAILY WITH A MEAL. 180 tablet 3  . cholecalciferol (VITAMIN D) 1000 units tablet Take 2,000 Units by mouth daily.    . Coenzyme Q10 (CO Q 10 PO) Take 1 tablet by mouth daily.    Marland Kitchen dextromethorphan (DELSYM) 30 MG/5ML liquid Take 15 mg by mouth at bedtime as needed for cough.    . Diphenhyd-Hydrocort-Nystatin (FIRST-DUKES MOUTHWASH) SUSP Swish and swallow 47ml every 6 hours as needed 240 mL 0  . ezetimibe (ZETIA) 10 MG tablet TAKE 1 TABLET (10 MG TOTAL) BY MOUTH DAILY. 90 tablet 2  . GARLIC PO Take 1 tablet by mouth daily.    Marland Kitchen guaiFENesin (MUCINEX) 600 MG 12 hr tablet Take 600 mg  by mouth daily.    Marland Kitchen levalbuterol (XOPENEX HFA) 45 MCG/ACT inhaler Inhale 2 puffs into the lungs every 4 (four) hours as needed for wheezing or shortness of breath. 1 Inhaler 3  . losartan (COZAAR) 25 MG tablet Take 1 tablet (25 mg total) by mouth daily. (Patient taking differently: Take 25 mg by mouth. Takes Monday, Wednesday, and Friday and Saturday) 90 tablet 3  . nystatin cream (MYCOSTATIN) Apply 1 application topically 2 (two) times daily. 60 g 1  . omega-3 acid ethyl esters (LOVAZA) 1 g capsule Take 1 g by mouth 2 (two) times daily.    Marland Kitchen omeprazole (PRILOSEC) 20 MG capsule TAKE 1 CAPSULE BY MOUTH EVERY DAY 30 capsule 1  . potassium chloride (K-DUR) 10 MEQ tablet Take 10 mEq by mouth  daily as needed (with torsemide). Takes every 7-10 days    . Red Yeast Rice 600 MG CAPS Take 1 capsule (600 mg total) by mouth 2 (two) times daily.    . RESTASIS 0.05 % ophthalmic emulsion Place 1 drop into both eyes 2 (two) times daily.    Marland Kitchen spironolactone (ALDACTONE) 25 MG tablet Take 1 tablet (25 mg total) by mouth every other day. (Patient taking differently: Take 25 mg by mouth every other day. Monday, Wednesday, Friday and Saturday) 45 tablet 3  . vitamin E 100 UNIT capsule Take 100 Units by mouth daily.    Marland Kitchen levofloxacin (LEVAQUIN) 500 MG tablet Take 1 tablet (500 mg total) by mouth daily. 7 tablet 0  . Pyridoxine HCl (VITAMIN B6 PO) Take 1 capsule by mouth daily. Reported on 07/12/2015    . tiotropium (SPIRIVA HANDIHALER) 18 MCG inhalation capsule Place 1 capsule (18 mcg total) into inhaler and inhale daily. 30 capsule 11  . torsemide (DEMADEX) 10 MG tablet Take 1 tablet (10 mg total) by mouth daily as needed (swelling).     No facility-administered medications prior to visit.      Per HPI unless specifically indicated in ROS section below Review of Systems Objective:    BP 120/72 (BP Location: Left Arm, Patient Position: Sitting, Cuff Size: Normal)   Pulse 79   Temp 97.6 F (36.4 C) (Oral)   Ht 5\' 5"  (1.651 m)   Wt 178 lb 12 oz (81.1 kg)   SpO2 97%   BMI 29.75 kg/m   Wt Readings from Last 3 Encounters:  05/31/18 178 lb 12 oz (81.1 kg)  04/05/18 180 lb 9.6 oz (81.9 kg)  02/14/18 179 lb 12 oz (81.5 kg)    Physical Exam Vitals signs and nursing note reviewed.  Constitutional:      Appearance: Normal appearance.  HENT:     Mouth/Throat:     Mouth: Mucous membranes are moist.     Pharynx: Oropharynx is clear.  Cardiovascular:     Rate and Rhythm: Normal rate and regular rhythm.     Pulses: Normal pulses.     Heart sounds: Murmur present.  Pulmonary:     Effort: Pulmonary effort is normal. No respiratory distress.     Breath sounds: Normal breath sounds. No wheezing,  rhonchi or rales.  Abdominal:     General: Abdomen is flat. Bowel sounds are normal. There is distension (bilateral lower abdomen).     Palpations: Abdomen is soft. There is no mass.     Tenderness: There is generalized abdominal tenderness. There is no right CVA tenderness, left CVA tenderness, guarding or rebound.     Hernia: No hernia is present.  Musculoskeletal:  Right lower leg: No edema.     Left lower leg: No edema.     Comments: Discomfort along midline spine No paraspinous mm tenderness Neg SLR bilaterally - with significant tightness at hamstrings bilaterally. No pain with int/ext rotation at hip. No pain at SIJ, or sciatic notch bilaterally. Point tender at [place].   Skin:    General: Skin is warm and dry.     Findings: No erythema or rash.  Neurological:     Mental Status: She is alert.     Sensory: Sensation is intact.     Motor: Motor function is intact.     Coordination: Coordination is intact.     Gait: Gait is intact.     Deep Tendon Reflexes:     Reflex Scores:      Patellar reflexes are 0 on the right side and 0 on the left side.    Comments: Diminished patellar reflexes bilaterally 5/5 strength BLE  Psychiatric:        Mood and Affect: Mood normal.       Results for orders placed or performed in visit on 05/31/18  Folate  Result Value Ref Range   Folate 23.3 >5.9 ng/mL  Vitamin B12  Result Value Ref Range   Vitamin B-12 941 (H) 211 - 911 pg/mL  Comprehensive metabolic panel  Result Value Ref Range   Sodium 140 135 - 145 mEq/L   Potassium 4.0 3.5 - 5.1 mEq/L   Chloride 103 96 - 112 mEq/L   CO2 28 19 - 32 mEq/L   Glucose, Bld 81 70 - 99 mg/dL   BUN 20 6 - 23 mg/dL   Creatinine, Ser 0.93 0.40 - 1.20 mg/dL   Total Bilirubin 0.4 0.2 - 1.2 mg/dL   Alkaline Phosphatase 95 39 - 117 U/L   AST 21 0 - 37 U/L   ALT 21 0 - 35 U/L   Total Protein 8.1 6.0 - 8.3 g/dL   Albumin 4.5 3.5 - 5.2 g/dL   Calcium 10.5 8.4 - 10.5 mg/dL   GFR 59.12 (L) >60.00  mL/min  TSH  Result Value Ref Range   TSH 1.18 0.35 - 4.50 uIU/mL  CBC with Differential/Platelet  Result Value Ref Range   WBC 9.7 4.0 - 10.5 K/uL   RBC 4.59 3.87 - 5.11 Mil/uL   Hemoglobin 13.5 12.0 - 15.0 g/dL   HCT 41.3 36.0 - 46.0 %   MCV 89.9 78.0 - 100.0 fl   MCHC 32.7 30.0 - 36.0 g/dL   RDW 13.0 11.5 - 15.5 %   Platelets 256.0 150.0 - 400.0 K/uL   Neutrophils Relative % 65.5 43.0 - 77.0 %   Lymphocytes Relative 22.9 12.0 - 46.0 %   Monocytes Relative 9.6 3.0 - 12.0 %   Eosinophils Relative 1.1 0.0 - 5.0 %   Basophils Relative 0.9 0.0 - 3.0 %   Neutro Abs 6.3 1.4 - 7.7 K/uL   Lymphs Abs 2.2 0.7 - 4.0 K/uL   Monocytes Absolute 0.9 0.1 - 1.0 K/uL   Eosinophils Absolute 0.1 0.0 - 0.7 K/uL   Basophils Absolute 0.1 0.0 - 0.1 K/uL  POCT Urinalysis Dipstick (Automated)  Result Value Ref Range   Color, UA yellow    Clarity, UA clear    Glucose, UA Negative Negative   Bilirubin, UA negative    Ketones, UA negative    Spec Grav, UA 1.015 1.010 - 1.025   Blood, UA negative    pH, UA 6.0 5.0 -  8.0   Protein, UA Negative Negative   Urobilinogen, UA 0.2 0.2 or 1.0 E.U./dL   Nitrite, UA negative    Leukocytes, UA Negative Negative   *Note: Due to a large number of results and/or encounters for the requested time period, some results have not been displayed. A complete set of results can be found in Results Review.   Assessment & Plan:   Problem List Items Addressed This Visit    Peripheral neuropathy    In h/o this, possible worsening neuropathy leading to quicker paresthesia/numbness with short periods of compression ie sitting on commode. Check labwork to further eval, consider neuro f/u vs lower extremity NCS. Previously saw Dr Delice Lesch.       Irritable bowel syndrome    Currently diarrhea predominates. Today mentions FODMAP elimination diet did not help.       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HCC)    Update SPEP. Followed by [facility]. Thought not currently clinically significant.        History of diverticular abscess   High total serum IgM    Update SPEP. Followed by [facility]. Thought not currently clinically significant.       Fibromyalgia   CKD (chronic kidney disease) stage 3, GFR 30-59 ml/min (HCC)   Relevant Orders   TSH (Completed)   POCT Urinalysis Dipstick (Automated) (Completed)   Chronic generalized abdominal pain    Multifactorial - IBS contributes, h/o significant diverticular disease h/o abscess s/p partial colectomy.       Relevant Orders   POCT Urinalysis Dipstick (Automated) (Completed)   Chronic fatigue and malaise   Relevant Orders   Folate (Completed)   Vitamin B12 (Completed)   Bursitis of right hip    Exam consistent with this - suggested f/u with ortho to consider steroid injection.      Bilateral leg numbness - Primary    Anticipate compressive neuropathy leading to quick leg numbness symptoms - see below.       Relevant Orders   Folate (Completed)   Vitamin B12 (Completed)   Comprehensive metabolic panel (Completed)   TSH (Completed)   CBC with Differential/Platelet (Completed)   Serum protein electrophoresis with reflex    Other Visit Diagnoses    Abdominal bloating       Relevant Orders   DG Abd 2 Views (Completed)   Diarrhea, unspecified type       Relevant Orders   TSH (Completed)   DG Abd 2 Views (Completed)       Meds ordered this encounter  Medications  . torsemide (DEMADEX) 10 MG tablet    Sig: Take 1 tablet (10 mg total) by mouth daily as needed (swelling).    Dispense:  30 tablet    Refill:  0  . vitamin B-12 (CYANOCOBALAMIN) 1000 MCG tablet    Sig: Take 1 tablet (1,000 mcg total) by mouth daily.   Orders Placed This Encounter  Procedures  . DG Abd 2 Views    Standing Status:   Future    Number of Occurrences:   1    Standing Expiration Date:   07/30/2019    Order Specific Question:   Reason for Exam (SYMPTOM  OR DIAGNOSIS REQUIRED)    Answer:   diarrhea, abd bloating    Order Specific  Question:   Preferred imaging location?    Answer:   [facility]    Order Specific Question:   Radiology Contrast Protocol - do NOT remove file path    Answer:   [email or web address]  .  Folate  . Vitamin B12  . Comprehensive metabolic panel  . TSH  . CBC with Differential/Platelet  . Serum protein electrophoresis with reflex  . POCT Urinalysis Dipstick (Automated)   Patient Instructions  Labs today. Abdominal xray today.  Urinalysis today.  We will be in touch with results. If unrevealing evaluation, return to [place] for evaluation.  You have right hip bursitis - steroid shot may help. Touch base with ortho about this.    Follow up plan: Return if symptoms worsen or fail to improve.  [name], MD

## 2018-05-31 NOTE — Patient Instructions (Addendum)
Labs today. Abdominal xray today.  Urinalysis today.  We will be in touch with results. If unrevealing evaluation, return to [place] for evaluation.  You have right hip bursitis - steroid shot may help. Touch base with ortho about this.

## 2018-06-01 ENCOUNTER — Encounter: Payer: Self-pay | Admitting: Family Medicine

## 2018-06-01 DIAGNOSIS — R200 Anesthesia of skin: Secondary | ICD-10-CM | POA: Insufficient documentation

## 2018-06-01 DIAGNOSIS — M7071 Other bursitis of hip, right hip: Secondary | ICD-10-CM | POA: Insufficient documentation

## 2018-06-01 NOTE — Assessment & Plan Note (Signed)
Update SPEP. Followed by [facility]. Thought not currently clinically significant.

## 2018-06-01 NOTE — Assessment & Plan Note (Signed)
Multifactorial - IBS contributes, h/o significant diverticular disease h/o abscess s/p partial colectomy.

## 2018-06-01 NOTE — Assessment & Plan Note (Signed)
Anticipate compressive neuropathy leading to quick leg numbness symptoms - see below.

## 2018-06-01 NOTE — Assessment & Plan Note (Signed)
Exam consistent with this - suggested f/u with ortho to consider steroid injection.

## 2018-06-01 NOTE — Assessment & Plan Note (Addendum)
In h/o this, possible worsening neuropathy leading to quicker paresthesia/numbness with short periods of compression ie sitting on commode. Check labwork to further eval, consider neuro f/u vs lower extremity NCS. Previously saw Dr Delice Lesch.

## 2018-06-01 NOTE — Assessment & Plan Note (Signed)
Currently diarrhea predominates. Today mentions FODMAP elimination diet did not help.

## 2018-06-05 DIAGNOSIS — M79672 Pain in left foot: Secondary | ICD-10-CM | POA: Diagnosis not present

## 2018-06-05 DIAGNOSIS — M65872 Other synovitis and tenosynovitis, left ankle and foot: Secondary | ICD-10-CM | POA: Diagnosis not present

## 2018-06-05 DIAGNOSIS — M7742 Metatarsalgia, left foot: Secondary | ICD-10-CM | POA: Diagnosis not present

## 2018-06-06 ENCOUNTER — Other Ambulatory Visit: Payer: Self-pay | Admitting: Cardiovascular Disease

## 2018-06-07 LAB — IFE INTERPRETATION

## 2018-06-07 LAB — PROTEIN ELECTROPHORESIS, SERUM, WITH REFLEX
Albumin ELP: 4.5 g/dL (ref 3.8–4.8)
Alpha 1: 0.3 g/dL (ref 0.2–0.3)
Alpha 2: 0.9 g/dL (ref 0.5–0.9)
Beta 2: 0.9 g/dL — ABNORMAL HIGH (ref 0.2–0.5)
Beta Globulin: 0.7 g/dL — ABNORMAL HIGH (ref 0.4–0.6)
Gamma Globulin: 0.8 g/dL (ref 0.8–1.7)
Total Protein: 8.1 g/dL (ref 6.1–8.1)

## 2018-06-18 DIAGNOSIS — H16223 Keratoconjunctivitis sicca, not specified as Sjogren's, bilateral: Secondary | ICD-10-CM | POA: Diagnosis not present

## 2018-06-18 DIAGNOSIS — L853 Xerosis cutis: Secondary | ICD-10-CM | POA: Diagnosis not present

## 2018-06-18 DIAGNOSIS — D1720 Benign lipomatous neoplasm of skin and subcutaneous tissue of unspecified limb: Secondary | ICD-10-CM | POA: Diagnosis not present

## 2018-06-18 DIAGNOSIS — B372 Candidiasis of skin and nail: Secondary | ICD-10-CM | POA: Diagnosis not present

## 2018-06-18 DIAGNOSIS — L570 Actinic keratosis: Secondary | ICD-10-CM | POA: Diagnosis not present

## 2018-06-21 DIAGNOSIS — M1711 Unilateral primary osteoarthritis, right knee: Secondary | ICD-10-CM | POA: Diagnosis not present

## 2018-06-21 DIAGNOSIS — M25561 Pain in right knee: Secondary | ICD-10-CM | POA: Diagnosis not present

## 2018-06-21 DIAGNOSIS — M7061 Trochanteric bursitis, right hip: Secondary | ICD-10-CM | POA: Diagnosis not present

## 2018-06-21 DIAGNOSIS — M25551 Pain in right hip: Secondary | ICD-10-CM | POA: Diagnosis not present

## 2018-06-27 ENCOUNTER — Emergency Department
Admission: EM | Admit: 2018-06-27 | Discharge: 2018-06-27 | Disposition: A | Discharge status: Home / Self Care | Payer: Medicare Other | Attending: Emergency Medicine | Admitting: Emergency Medicine

## 2018-06-27 ENCOUNTER — Other Ambulatory Visit: Payer: Self-pay

## 2018-06-27 ENCOUNTER — Encounter: Payer: Self-pay | Admitting: Intensive Care

## 2018-06-27 DIAGNOSIS — I5022 Chronic systolic (congestive) heart failure: Secondary | ICD-10-CM | POA: Insufficient documentation

## 2018-06-27 DIAGNOSIS — Z85828 Personal history of other malignant neoplasm of skin: Secondary | ICD-10-CM | POA: Diagnosis not present

## 2018-06-27 DIAGNOSIS — I130 Hypertensive heart and chronic kidney disease with heart failure and stage 1 through stage 4 chronic kidney disease, or unspecified chronic kidney disease: Secondary | ICD-10-CM | POA: Insufficient documentation

## 2018-06-27 DIAGNOSIS — Z79899 Other long term (current) drug therapy: Secondary | ICD-10-CM | POA: Diagnosis not present

## 2018-06-27 DIAGNOSIS — R55 Syncope and collapse: Secondary | ICD-10-CM | POA: Diagnosis not present

## 2018-06-27 DIAGNOSIS — R197 Diarrhea, unspecified: Secondary | ICD-10-CM | POA: Insufficient documentation

## 2018-06-27 DIAGNOSIS — I447 Left bundle-branch block, unspecified: Secondary | ICD-10-CM | POA: Diagnosis not present

## 2018-06-27 DIAGNOSIS — Z91040 Latex allergy status: Secondary | ICD-10-CM | POA: Insufficient documentation

## 2018-06-27 DIAGNOSIS — N183 Chronic kidney disease, stage 3 (moderate): Secondary | ICD-10-CM | POA: Diagnosis not present

## 2018-06-27 DIAGNOSIS — Z950 Presence of cardiac pacemaker: Secondary | ICD-10-CM | POA: Diagnosis not present

## 2018-06-27 DIAGNOSIS — R9431 Abnormal electrocardiogram [ECG] [EKG]: Secondary | ICD-10-CM | POA: Diagnosis not present

## 2018-06-27 DIAGNOSIS — Z7982 Long term (current) use of aspirin: Secondary | ICD-10-CM | POA: Diagnosis not present

## 2018-06-27 DIAGNOSIS — I213 ST elevation (STEMI) myocardial infarction of unspecified site: Secondary | ICD-10-CM | POA: Diagnosis not present

## 2018-06-27 DIAGNOSIS — R42 Dizziness and giddiness: Secondary | ICD-10-CM | POA: Diagnosis not present

## 2018-06-27 DIAGNOSIS — R4020 Unspecified coma: Secondary | ICD-10-CM | POA: Diagnosis not present

## 2018-06-27 LAB — BASIC METABOLIC PANEL
Anion gap: 10 (ref 5–15)
BUN: 28 mg/dL — ABNORMAL HIGH (ref 8–23)
CO2: 25 mmol/L (ref 22–32)
Calcium: 9.6 mg/dL (ref 8.9–10.3)
Chloride: 105 mmol/L (ref 98–111)
Creatinine, Ser: 0.98 mg/dL (ref 0.44–1.00)
GFR calc Af Amer: >60 mL/min (ref >60)
GFR calc non Af Amer: 58 mL/min — ABNORMAL LOW (ref >60)
Glucose, Bld: 157 mg/dL — ABNORMAL HIGH (ref 70–99)
Potassium: 4.5 mmol/L (ref 3.5–5.1)
Sodium: 140 mmol/L (ref 135–145)

## 2018-06-27 LAB — CBC
HCT: 40.3 % (ref 36.0–46.0)
Hemoglobin: 12.8 g/dL (ref 12.0–15.0)
MCH: 29.2 pg (ref 26.0–34.0)
MCHC: 31.8 g/dL (ref 30.0–36.0)
MCV: 91.8 fL (ref 80.0–100.0)
Platelets: 234 10*3/uL (ref 150–400)
RBC: 4.39 MIL/uL (ref 3.87–5.11)
RDW: 12.8 % (ref 11.5–15.5)
WBC: 11.4 10*3/uL — ABNORMAL HIGH (ref 4.0–10.5)
nRBC: 0 % (ref 0.0–0.2)

## 2018-06-27 LAB — TROPONIN I: Troponin I: <0.03 ng/mL (ref <0.03)

## 2018-06-27 MED ORDER — SODIUM CHLORIDE 0.9 % IV SOLN: Freq: Once | INTRAVENOUS | Status: DC

## 2018-06-27 MED ORDER — SODIUM CHLORIDE 0.9% FLUSH: 3.0000 mL | Freq: Once | INTRAVENOUS | Status: DC

## 2018-06-27 MED ORDER — SODIUM CHLORIDE 0.9 % IV SOLN
Freq: Once | INTRAVENOUS | Status: AC
  Administered 2018-06-27: 13:00:00 via INTRAVENOUS

## 2018-06-27 NOTE — ED Notes (Signed)
Unhooked to use restroom and placed back in bed to be monitored

## 2018-06-27 NOTE — ED Notes (Signed)
[employer] report: Single chamber ICD. Last checked august 2019. Reports nothing recorded recently 0.1% recording the time. Low rate 40 programmed. Censing intrinsic rate at 71 BMP. Potential fluid retention measurement

## 2018-06-27 NOTE — ED Provider Notes (Signed)
[facility] Emergency Department Provider Note  ____________________________________________   I have reviewed the triage vital signs and the nursing notes.   HISTORY  Chief Complaint Dizziness   History limited by: Not Limited   HPI Ashley [name] is a 73 y.o. female who presents to the emergency department today because of concerns for a near syncopal episode.  Patient states that she has a long history of GI issues.  She has had a colectomy and since then has had problems with diarrhea as well as constipation.  Today she was at the [employer] when she had to go to the bathroom.  She had multiple episodes of watery diarrhea.  She then started feeling dizzy.  She did lower herself to the ground.  Firefighter on scene said she was semi-conscious.  Initial blood pressure for EMS was very low.  At time my exam the patient stated she felt better.  She also has history of significant heart failure and has a pacemaker defibrillator.  Per medical record review patient has a history of GERD, depression.  Past Medical History:  Diagnosis Date  . Abdominal aortic atherosclerosis ([place]) 11/2015   by CT  . Allergy   . Anemia   . Anxiety   . Automatic implantable cardioverter-defibrillator in situ 2012   a. MDT single lead ICD.  Marland Kitchen Bronchiectasis    a. possibly mild, treat URIs aggressively  . Cancer (HCC)    basal cell, arms & neck  . Chronic sinusitis   . Depression   . Diarrhea 07/14/2008   Qualifier: Diagnosis of  By: Glori Bickers MD, Carmell Austria   . Disorder of vocal cords   . Essential hypertension   . Fibrocystic breast disease   . Fibromyalgia   . GERD (gastroesophageal reflux disease)   . H/O multiple allergies 10/10/2013  . Hemorrhoids   . History of diverticulitis of colon   . History of pneumonia 2012  . Insomnia   . Irritable bowel syndrome   . Laryngeal nodule    Dr. Thomasena Edis  . Lung disease 07/14/11  . Migraine without aura, without mention of intractable  migraine without mention of status migrainosus   . Mitral regurgitation   . Nonischemic cardiomyopathy ([place])    a. Recovered LV fxn - prev as low as 25%, now 50-55% by echo 04/2014;  b. 2012 s/p MDT [id number] [place] VR single lead ICD.  Marland Kitchen Osteoarthritis    knees, back, hands, low back,   . Osteoporosis 01/2013   T-2.7 spine (01/2016)  . Pneumonia 2011  . PONV (postoperative nausea and vomiting)   . Pre-syncope   . sigmoid diverticulitis with perforation, abscess and fistula 09/30/2013  . Sleep apnea 2010   borderline, inconclusive- /w [place]     Patient Active Problem List   Diagnosis Date Noted  . Bilateral leg numbness 06/01/2018  . Bursitis of right hip 06/01/2018  . Right ear pain 02/16/2018  . Back pain 02/14/2018  . UTI (urinary tract infection) 02/14/2018  . Mixed hyperlipidemia 01/17/2018  . Cervical lymphadenopathy 01/05/2018  . Plantar fasciitis, bilateral 12/13/2017  . Acquired renal cyst of right kidney 08/11/2017  . Unsteadiness on feet 07/24/2017  . Multiple rib fractures 06/19/2017  . 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 High total serum IgM 11/22/2016  . 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 Bleeding external hemorrhoids 10/31/2016  . LBBB (left bundle branch block) 07/11/2016  . Gastritis 06/12/2016  . Bladder wall thickening 02/16/2016  . Fecal incontinence   .  Chronic generalized abdominal pain 11/22/2015  . Abdominal aortic atherosclerosis ([place]) 11/13/2015  . 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  . Hoarseness 06/21/2015  . Hypersensitivity to pneumococcal vaccine 06/10/2015  . Medicare annual wellness visit, subsequent 06/08/2015  . Advanced care planning/counseling discussion 06/08/2015  . Intertrigo 04/06/2015  . Chronic daily headache 04/06/2015  . Peripheral neuropathy 04/06/2015  . Nonischemic cardiomyopathy ([place])   . Chest pain and shortness of breath 12/25/2014  . S/P colostomy takedown 02/10/2014  . ICD (implantable cardioverter-defibrillator) in place  10/23/2013  . H/O multiple allergies 10/10/2013  . History of diverticular abscess 09/25/2013  . Osteoporosis 01/13/2013  . Irregular heart beat 08/06/2012  . MDD (major depressive disorder), recurrent episode, moderate ([place]) 07/09/2012  . Chronic recurrent sinusitis 06/17/2012  . Vitamin D deficiency 03/05/2012  . Essential tremor 12/29/2011  . GERD (gastroesophageal reflux disease) 11/13/2011  . Allergic rhinitis 11/13/2011  . CKD (chronic kidney disease) stage 3, GFR 30-59 ml/min (HCC) 11/09/2011  . Epistaxis, recurrent 10/11/2011  . Orthostatic hypotension 04/30/2011  . Cardiomyopathy, dilated, nonischemic ([place]) 03/01/2011  . Mitral regurgitation 02/24/2011  . Chronic systolic CHF (congestive heart failure) ([place]) 02/24/2011  . Chronic bronchitis ([place]) 01/20/2011  . Syncope 11/02/2010  . Chronic fatigue and malaise 11/02/2010  . Anxiety state 08/18/2010  . 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  . Dyslipidemia 11/22/2006  . COMMON MIGRAINE 11/22/2006  . Irritable bowel syndrome 11/22/2006  . FIBROCYSTIC BREAST DISEASE 11/22/2006  . Osteoarthritis 11/22/2006  . Fibromyalgia 11/22/2006    Past Surgical History:  Procedure Laterality Date  . ABDOMINAL HYSTERECTOMY  1987   w/BSO  . ANAL RECTAL MANOMETRY N/A 02/02/2016   Procedure: ANO RECTAL MANOMETRY;  Surgeon: Mauri Pole, MD;  Location: [facility] ENDOSCOPY;  Service: Endoscopy;  Laterality: N/A;  . APPENDECTOMY    . AUGMENTATION MAMMAPLASTY    . breast cystectomies     due to FCBD  . BREAST MASS EXCISION  01/2010   Left-fibrocystic change w/intraductal papilloma, no malignancy  . carotid US  52/8413   2-44% LICA, normal RICA  . [employer] Bunionectomy  11/04/08   Right Great Toe (Dr. Beola Cord)  . COLONOSCOPY  01/2018   1 small benign polyp, diverticulosis, healthy anastomosis [name] @ [facility])  . COLOSTOMY N/A 10/02/2013   DESCENDING COLOSTOMY;  Surgeon: Imogene Burn. Tsuei, [place] N/A 02/10/2014   [name], MD  . EMG/MCV  10/16/01   + mild carpal tunnel  . ESOPHAGOGASTRODUODENOSCOPY  09/2014   small HH, irregular Z line, fundic gland polyps with mild gastritis ([place])  . Flex laryngoscopy  06/11/06   ([place]) nml  . IMPLANTABLE CARDIOVERTER DEFIBRILLATOR IMPLANT N/A 07/14/2011   MDT single chamber ICD  . KNEE ARTHROSCOPY W/ ORIF     Left; "meniscus tear"  . LASIK     left eye  . MANDIBLE SURGERY     "got about 6 pins in the bottom of my jaw"  . NCS  11/2014   L ulnar neuropathy, mild B median nerve entrapment ([place])  . OSTEOTOMY     Left foot  . PARTIAL COLECTOMY N/A 10/02/2013   PARTIAL COLECTOMY;  Surgeon: Imogene Burn. Tsuei, MD  . sinus surgery     x3 with balloon   . TOE AMPUTATION     "toe beside baby toe on left foot; got gangrene from corn"  . TONSILLECTOMY AND ADENOIDECTOMY      Prior to Admission medications   Medication Sig Start Date End Date Taking? Authorizing  Provider  acetaminophen (TYLENOL) 500 MG tablet Take 500 mg by mouth daily as needed (PAIN).    [provider]  albuterol (PROVENTIL HFA;VENTOLIN HFA) 108 (90 Base) MCG/ACT inhaler TAKE 2 PUFFS BY MOUTH EVERY 6 HOURS AS NEEDED FOR WHEEZE OR SHORTNESS OF BREATH 01/10/18   [name], MD  AMBULATORY NON FORMULARY MEDICATION Medication Name:  1 squatty potty to use as needed 01/20/16   Mauri Pole, MD  Ascorbic Acid (VITAMIN C) 1000 MG tablet Take 1,000 mg by mouth daily.    [provider]  aspirin EC 81 MG tablet Take 81 mg by mouth at bedtime.     [provider]  BIOTIN PO Take 1 tablet by mouth daily.    [provider]  Calcium Carbonate-Vitamin D (CALCIUM + D PO) Take 1 tablet by mouth daily.    [provider]  carvedilol (COREG) 6.25 MG tablet TAKE 1 TABLET (6.25 MG TOTAL) BY MOUTH 2 (TWO) TIMES DAILY WITH A MEAL. 06/06/18   Minna Merritts, MD  cholecalciferol (VITAMIN D) 1000 units tablet Take 2,000 Units by mouth daily.    [provider]  Coenzyme Q10 (CO Q 10 PO) Take 1 tablet by mouth daily.    [provider]  dextromethorphan (DELSYM) 30 MG/5ML liquid Take 15 mg by mouth at bedtime as needed for cough.    [provider]  Diphenhyd-Hydrocort-Nystatin (FIRST-DUKES MOUTHWASH) SUSP Swish and swallow 20ml every 6 hours as needed 04/05/18   Vivi Barrack, MD  ezetimibe (ZETIA) 10 MG tablet TAKE 1 TABLET (10 MG TOTAL) BY MOUTH DAILY. 04/17/18   [name], MD  GARLIC PO Take 1 tablet by mouth daily.    [provider]  guaiFENesin (MUCINEX) 600 MG 12 hr tablet Take 600 mg by mouth daily.    [provider]  levalbuterol Penne Lash HFA) 45 MCG/ACT inhaler Inhale 2 puffs into the lungs every 4 (four) hours as needed for wheezing or shortness of breath. 01/04/18   [name], MD  losartan (COZAAR) 25 MG tablet Take 1 tablet (25 mg total) by mouth daily. Patient taking differently: Take 25 mg by mouth. Takes Monday, Wednesday, and Friday and Saturday 01/17/18   Minna Merritts, MD  nystatin cream (MYCOSTATIN) Apply 1 application topically 2 (two) times daily. 06/22/17   [name], MD  omega-3 acid ethyl esters (LOVAZA) 1 g capsule Take 1 g by mouth 2 (two) times daily.    [provider]  omeprazole (PRILOSEC) 20 MG capsule TAKE 1 CAPSULE BY MOUTH EVERY DAY 01/09/18   [name], MD  potassium chloride (K-DUR) 10 MEQ tablet Take 10 mEq by mouth daily as needed (with torsemide). Takes every 7-10 days 01/04/18   [name], MD  Red Yeast Rice 600 MG CAPS Take 1 capsule (600 mg total) by mouth 2 (two) times daily. 06/12/16   [name], MD  RESTASIS 0.05 % ophthalmic emulsion Place 1 drop into both eyes 2 (two) times daily. 12/25/17   [provider]  spironolactone (ALDACTONE) 25 MG tablet Take 1 tablet (25 mg total) by mouth every other day. Patient taking differently: Take 25 mg by mouth every other day. Monday, Wednesday, Friday and Saturday 01/17/18    Minna Merritts, MD  torsemide (DEMADEX) 10 MG tablet Take 1 tablet (10 mg total) by mouth daily as needed (swelling). 05/31/18   [name], MD  vitamin B-12 (CYANOCOBALAMIN) 1000 MCG tablet Take 1 tablet (1,000 mcg total) by  mouth daily. 05/31/18   [name], MD  vitamin E 100 UNIT capsule Take 100 Units by mouth daily.    [provider]  bisoprolol (ZEBETA) 5 MG tablet Take 5 mg by mouth daily. 06/06/11 07/24/11  Minna Merritts, MD    Allergies Antihistamines, chlorpheniramine-type; Codeine; Cyclobenzaprine; Cymbalta [duloxetine hcl]; Influenza vac split [flu virus vaccine]; Prevnar [pneumococcal 13-val conj vacc]; Saline; Sulfasalazine; Atorvastatin; Chlorhexidine; Dilaudid [hydromorphone hcl]; Latex; Morphine and related; Pravastatin; Prevacid [lansoprazole]; Red dye; Rosuvastatin; Simvastatin; Diflucan [fluconazole]; Onion; Other; Penicillins; Sertraline; Tape; and Metaxalone  Family History  Problem Relation Age of Onset  . Lung cancer Father        + smoker  . Colon polyps Father   . Dementia Mother   . Osteoporosis Mother        Lumbar spine  . Stroke Mother        x 5 @ 109 YOA  . Arthritis Mother        hands  . Emphysema Mother   . Alcohol abuse Paternal Grandfather   . Stroke Paternal Grandfather        ?  Marland Kitchen Heart disease Paternal Grandfather   . Hypertension Maternal Grandfather        ?  Marland Kitchen Heart disease Paternal Grandmother   . Colon cancer Neg Hx   . Esophageal cancer Neg Hx   . Gallbladder disease Neg Hx     Social History Social History   Tobacco Use  . Smoking status: Never Smoker  . Smokeless tobacco: Never Used  . Tobacco comment: Lived with smokers x 23 yrs, smoked herself "for a week"  Substance Use Topics  . Alcohol use: No    Alcohol/week: 0.0 standard drinks  . Drug use: No    Review of Systems Constitutional: No fever/chills Eyes: No visual changes. ENT: No sore throat. Cardiovascular: Denies chest  pain. Respiratory: Denies shortness of breath. Gastrointestinal: No abdominal pain.  No nausea, no vomiting.  No diarrhea.   Genitourinary: Negative for dysuria. Musculoskeletal: Negative for back pain. Skin: Negative for rash. Neurological: Positive for near syncopal episode.   ____________________________________________   PHYSICAL EXAM:  VITAL SIGNS: ED Triage Vitals  Enc Vitals Group     BP 06/27/18 1147 110/73     Pulse Rate 06/27/18 1149 70     Resp 06/27/18 1147 18     Temp 06/27/18 1149 97.7 F (36.5 C)     Temp Source 06/27/18 1149 Oral     SpO2 06/27/18 1149 96 %     Weight 06/27/18 1146 178 lb (80.7 kg)     Height 06/27/18 1146 5\' 5"  (1.651 m)     Head Circumference --      Peak Flow --      Pain Score 06/27/18 1146 1   Constitutional: Alert and oriented.  Eyes: Conjunctivae are normal.  ENT      Head: Normocephalic and atraumatic.      Nose: No congestion/rhinnorhea.      Mouth/Throat: Mucous membranes are moist.      Neck: No stridor. Hematological/Lymphatic/Immunilogical: No cervical lymphadenopathy. Cardiovascular: Normal rate, regular rhythm.  No murmurs, rubs, or gallops.  Respiratory: Normal respiratory effort without tachypnea nor retractions. Breath sounds are clear and equal bilaterally. No wheezes/rales/rhonchi. Gastrointestinal: Soft and non tender. No rebound. No guarding.  Genitourinary: Deferred Musculoskeletal: Normal range of motion in all extremities. No lower extremity edema. Neurologic:  Normal speech and language. No gross focal neurologic deficits are appreciated.  Skin:  Skin is warm, dry and intact. No rash noted. Psychiatric: Mood and affect are normal. Speech and behavior are normal. Patient exhibits appropriate insight and judgment.  ____________________________________________    LABS (pertinent positives/negatives)  Trop <0.03 CBC wbc 11.4, hgb 12.8, plt 234 BMP wnl except glu 157, bun  28  ____________________________________________   EKG  I, Nance Pear, attending physician, personally viewed and interpreted this EKG  EKG Time: 1150 Rate: 72 Rhythm: sinus rhythm Axis: left axis deviation Intervals: qtc 498 QRS: LBBB ST changes: no st elevation Impression: abnormal ekg   ____________________________________________    RADIOLOGY  None  ____________________________________________   PROCEDURES  Procedures  ____________________________________________   INITIAL IMPRESSION / ASSESSMENT AND PLAN / ED COURSE  Pertinent labs & imaging results that were available during my care of the patient were reviewed by me and considered in my medical decision making (see chart for details).   Patient presented to the emergency department today after a near syncopal episode.  Differential would be broad including electrolyte abnormality, anemia, vasovagal, ACS etiologies.  Patient's work-up here without concerning findings.  Given the fact the patient was having a large amount of diarrhea just before this happened I do think vasovagal likely.  Patient did feel well during her stay here in the emergency department.  Pacemaker was interrogated without any concerning events.  This point discussed vasovagal syncope with patient.  Discussed importance of primary care follow-up. ____________________________________________   FINAL CLINICAL IMPRESSION(S) / ED DIAGNOSES  Final diagnoses:  Near syncope     Note: This dictation was prepared with Dragon dictation. Any transcriptional errors that result from this process are unintentional     Nance Pear, MD 06/27/18 1413

## 2018-06-27 NOTE — ED Triage Notes (Addendum)
Patient arrived by EMS from [employer]. When to use restroom and when she came out felt herself feeling faint. Eased herself to the ground before falling. Laying on left side in floor when EMS arrived. Initial 72/40 b/p, HX left bundle branch block, pacemaker/defibrillater 2013, COPD, and CHF. 100/48 last b/p from EMS. A&O x4 upon arrival. EMs administered 12mL fluid. Patient states "Donnald Garre had gut trouble all my life and have has problems with diarrhea since a child"

## 2018-06-27 NOTE — Discharge Instructions (Signed)
Please seek medical attention for any high fevers, chest pain, shortness of breath, change in behavior, persistent vomiting, bloody stool or any other new or concerning symptoms.  

## 2018-06-28 ENCOUNTER — Other Ambulatory Visit: Payer: Self-pay | Admitting: Family Medicine

## 2018-07-01 ENCOUNTER — Ambulatory Visit (INDEPENDENT_AMBULATORY_CARE_PROVIDER_SITE_OTHER): Payer: Medicare Other | Admitting: Internal Medicine

## 2018-07-01 ENCOUNTER — Ambulatory Visit: Payer: Medicare Other | Admitting: Family Medicine

## 2018-07-01 VITALS — BP 116/74 | HR 72 | Temp 97.7°F | Wt 177.0 lb

## 2018-07-01 DIAGNOSIS — I447 Left bundle-branch block, unspecified: Secondary | ICD-10-CM | POA: Diagnosis not present

## 2018-07-01 DIAGNOSIS — R55 Syncope and collapse: Secondary | ICD-10-CM
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
## 2018-07-02 ENCOUNTER — Encounter: Payer: Self-pay | Admitting: Cardiovascular Disease

## 2018-07-02 ENCOUNTER — Ambulatory Visit (INDEPENDENT_AMBULATORY_CARE_PROVIDER_SITE_OTHER): Payer: Medicare Other | Admitting: Cardiovascular Disease

## 2018-07-02 VITALS — BP 115/68 | HR 76 | Ht 65.0 in | Wt 177.5 lb

## 2018-07-02 DIAGNOSIS — I5022 Chronic systolic (congestive) heart failure: Secondary | ICD-10-CM | POA: Diagnosis not present

## 2018-07-02 DIAGNOSIS — R55 Syncope and collapse: Secondary | ICD-10-CM

## 2018-07-02 DIAGNOSIS — E782 Mixed hyperlipidemia: Secondary | ICD-10-CM | POA: Diagnosis not present

## 2018-07-02 DIAGNOSIS — I420 Dilated cardiomyopathy: Secondary | ICD-10-CM | POA: Diagnosis not present

## 2018-07-02 DIAGNOSIS — I10 Essential (primary) hypertension: Secondary | ICD-10-CM | POA: Diagnosis not present

## 2018-07-02 NOTE — Patient Instructions (Signed)

## 2018-07-03 ENCOUNTER — Telehealth: Payer: Self-pay | Admitting: Cardiovascular Disease

## 2018-07-03 NOTE — Telephone Encounter (Signed)
Spoke with patient and she states that she did not recall taking zetia but she does take ezetimibe. Reviewed that these are the same medication and she states that she has been taking that all along. Reviewed that sometimes they use the generic name but those are the same medication. She verbalized understanding with no further questions at this time.

## 2018-07-03 NOTE — Telephone Encounter (Signed)
Patient came by office and dropped of medication list that is accurate - placed in nurse box  States that patient saw Dr [name] yesterday and is afraid Dr [name] was under the impression patient had been taking Zetia medication States that she has not been on Zetia for a least six months Would like to discuss with nurse to see if she should restart medication Please call

## 2018-07-04 ENCOUNTER — Encounter: Payer: Self-pay | Admitting: Internal Medicine

## 2018-07-04 NOTE — Patient Instructions (Signed)
Left Bundle Branch Block  Left bundle branch block (LBBB) is a problem with the way that electrical impulses pass through the heart (electrical conduction abnormality). The heart depends on an electrical pulse to beat normally. The electrical signal for a heartbeat starts in the upper chambers of the heart (atria) and then travels to the two lower chambers (left and rightventricles). An LBBB is a partial or complete block of the pathway that carries the signal to the left ventricle. If you have LBBB, the left side of your heart does not beat normally. LBBB may be a warning sign of heart disease. What are the causes? This condition may be caused by:  Heart disease.  Disease of the arteries in the heart (arteriosclerosis).  Stiffening or weakening of heart muscle (cardiomyopathy).  Infection of heart muscle (myocarditis).  High blood pressure (hypertension). In some cases, the cause may not be known. What increases the risk? The following factors may make you more likely to develop this condition:  Being female.  Being 1 years of age or older.  Having heart disease.  Having had a heart attack or heart surgery. What are the signs or symptoms? This condition may not cause any symptoms. If you do have symptoms, they may include:  Feeling dizzy or light-headed.  Fainting. How is this diagnosed? This condition may be diagnosed based on an electrocardiogram (ECG). It is often diagnosed when an ECG is done as part of a routine physical or to help find the cause of fainting spells. You may also have imaging tests to find out more about your condition. These may include:  Chest X-rays.  Echo test (echocardiogram). How is this treated? If you do not have symptoms or any other type of heart disease, you may not need treatment for this condition. However, you may need to see your health care provider more often because LBBB can be a warning sign of future heart problems. You may get  treatment for other heart problems or high blood pressure. If LBBB causes symptoms or other heart problems, you may need to have an electrical device (pacemaker) implanted under the skin of your chest. A pacemaker sends electrical signals to your heart to keep it beating normally. Follow these instructions at home:  Follow instructions from your health care provider about eating or drinking restrictions.  Take over-the-counter and prescription medicines only as told by your health care provider.  Return to your normal activities as told by your health care provider. Ask your health care provider what activities are safe for you.  Follow a heart-healthy diet and maintain a healthy weight. Work with a diet and nutrition specialist (dietitian) to create an eating plan that is best for you.  Get regular exercise as told by your health care provider.  Do not use any products that contain nicotine or tobacco, such as cigarettes and e-cigarettes. If you need help quitting, ask your health care provider.  Keep all follow-up visits as told by your health care provider. This is important. Contact a health care provider if:  You are light-headed or you faint. Get help right away if:  You have chest pain.  You have difficulty breathing. These symptoms may represent a serious problem that is an emergency. Do not wait to see if the symptoms will go away. Get medical help right away. Call your local emergency services (911 in the [place] Do not drive yourself to the hospital. Summary  For the heart to beat normally, an electrical signal must  travel to the lower left chamber of the heart (left ventricle). Left bundle branch block (LBBB) is a partial or complete block of the pathway that carries that signal.  This condition may not cause any symptoms. In some cases, a person may feel dizzy or light-headed or may faint.  Treatment may not be needed for LBBB if you do not have symptoms or any other type  of heart disease.  You may need to see your health care provider more often because LBBB can be a warning sign of future heart problems. This information is not intended to replace advice given to you by your health care provider. Make sure you discuss any questions you have with your health care provider. Document Released: 06/20/2016 Document Revised: 06/20/2016 Document Reviewed: 06/20/2016 Elsevier Interactive Patient Education  2019 [employer].

## 2018-07-04 NOTE — Progress Notes (Signed)
Subjective:    Patient ID: Ashley [name], female    DOB: 04-03-1946, 73 y.o.   MRN: [id number]  HPI  Pt presents to the clinic today for ER follow up. She went to the ER 2/13 after having a syncopal episode at [employer]. She was using the restroom, when she felt lightheaded and lowered herself to the floor. Initial BP was low. ECG showed a left bundle branch block. Labs were unremarkable. She returned to baseline without intervention. They suspected she had a vagal episode prior to passing out. She was given some IV fluids and advised to follow up with cardiology and PCP. Since discharge, she has not had any more syncopal episodes. Her biggest concerns are 1- she is concerned about this "blockage" in her heart and is concerned that she may have a heart attack. 2- she also wants to know why no one told her that having a "vagal episode" is possible given her heart and GI issues.  She does not have an appt to follow up with cardiology.  Review of Systems  Past Medical History:  Diagnosis Date  . Abdominal aortic atherosclerosis ([place]) 11/2015   by CT  . Allergy   . Anemia   . Anxiety   . Automatic implantable cardioverter-defibrillator in situ 2012   a. MDT single lead ICD.  Marland Kitchen Bronchiectasis    a. possibly mild, treat URIs aggressively  . Cancer (HCC)    basal cell, arms & neck  . Chronic sinusitis   . Depression   . Diarrhea 07/14/2008   Qualifier: Diagnosis of  By: Glori Bickers MD, Carmell Austria   . Disorder of vocal cords   . Essential hypertension   . Fibrocystic breast disease   . Fibromyalgia   . GERD (gastroesophageal reflux disease)   . H/O multiple allergies 10/10/2013  . Hemorrhoids   . History of diverticulitis of colon   . History of pneumonia 2012  . Insomnia   . Irritable bowel syndrome   . Laryngeal nodule    Dr. Thomasena Edis  . Lung disease 07/14/11  . Migraine without aura, without mention of intractable migraine without mention of status migrainosus   . Mitral regurgitation   .  Nonischemic cardiomyopathy ([place])    a. Recovered LV fxn - prev as low as 25%, now 50-55% by echo 04/2014;  b. 2012 s/p MDT [id number] [place] VR single lead ICD.  Marland Kitchen Osteoarthritis    knees, back, hands, low back,   . Osteoporosis 01/2013   T-2.7 spine (01/2016)  . Pneumonia 2011  . PONV (postoperative nausea and vomiting)   . Pre-syncope   . sigmoid diverticulitis with perforation, abscess and fistula 09/30/2013  . Sleep apnea 2010   borderline, inconclusive- /w [place]     Current Outpatient Medications  Medication Sig Dispense Refill  . acetaminophen (TYLENOL) 500 MG tablet Take 500 mg by mouth daily as needed (PAIN).    Marland Kitchen albuterol (PROVENTIL HFA;VENTOLIN HFA) 108 (90 Base) MCG/ACT inhaler TAKE 2 PUFFS BY MOUTH EVERY 6 HOURS AS NEEDED FOR WHEEZE OR SHORTNESS OF BREATH 18 Inhaler 1  . AMBULATORY NON FORMULARY MEDICATION Medication Name:  1 squatty potty to use as needed 1 each 0  . Ascorbic Acid (VITAMIN C) 1000 MG tablet Take 1,000 mg by mouth daily.    Marland Kitchen aspirin EC 81 MG tablet Take 81 mg by mouth at bedtime.     Marland Kitchen BIOTIN PO Take 1 tablet by mouth daily.    . Calcium Carbonate-Vitamin D (CALCIUM +  D PO) Take 1 tablet by mouth daily.    . carvedilol (COREG) 6.25 MG tablet TAKE 1 TABLET (6.25 MG TOTAL) BY MOUTH 2 (TWO) TIMES DAILY WITH A MEAL. 180 tablet 3  . cholecalciferol (VITAMIN D) 1000 units tablet Take 2,000 Units by mouth daily.    . Coenzyme Q10 (CO Q 10 PO) Take 1 tablet by mouth daily.    Marland Kitchen dextromethorphan (DELSYM) 30 MG/5ML liquid Take 15 mg by mouth at bedtime as needed for cough.    . Diphenhyd-Hydrocort-Nystatin (FIRST-DUKES MOUTHWASH) SUSP Swish and swallow 30ml every 6 hours as needed 240 mL 0  . ezetimibe (ZETIA) 10 MG tablet TAKE 1 TABLET (10 MG TOTAL) BY MOUTH DAILY. 90 tablet 2  . GARLIC PO Take 1 tablet by mouth daily.    Marland Kitchen guaiFENesin (MUCINEX) 600 MG 12 hr tablet Take 600 mg by mouth daily.    Marland Kitchen levalbuterol (XOPENEX HFA) 45 MCG/ACT inhaler Inhale 2 puffs into  the lungs every 4 (four) hours as needed for wheezing or shortness of breath. 1 Inhaler 3  . losartan (COZAAR) 25 MG tablet Take 1 tablet (25 mg total) by mouth daily. (Patient taking differently: Take 25 mg by mouth. Takes Monday, Wednesday, and Friday and Saturday) 90 tablet 3  . nystatin cream (MYCOSTATIN) Apply 1 application topically 2 (two) times daily. 60 g 1  . omega-3 acid ethyl esters (LOVAZA) 1 g capsule Take 1 g by mouth 2 (two) times daily.    Marland Kitchen omeprazole (PRILOSEC) 20 MG capsule TAKE 1 CAPSULE BY MOUTH EVERY DAY 30 capsule 1  . potassium chloride (K-DUR) 10 MEQ tablet Take 10 mEq by mouth daily as needed (with torsemide). Takes every 7-10 days    . Red Yeast Rice 600 MG CAPS Take 1 capsule (600 mg total) by mouth 2 (two) times daily.    . RESTASIS 0.05 % ophthalmic emulsion Place 1 drop into both eyes 2 (two) times daily.    Marland Kitchen spironolactone (ALDACTONE) 25 MG tablet Take 1 tablet (25 mg total) by mouth every other day. (Patient taking differently: Take 25 mg by mouth every other day. Monday, Wednesday, Friday and Saturday) 45 tablet 3  . torsemide (DEMADEX) 10 MG tablet TAKE 1 TABLET (10 MG TOTAL) BY MOUTH DAILY AS NEEDED (SWELLING). 30 tablet 0  . vitamin B-12 (CYANOCOBALAMIN) 1000 MCG tablet Take 1 tablet (1,000 mcg total) by mouth daily.    . vitamin E 100 UNIT capsule Take 100 Units by mouth daily.    . fluconazole (DIFLUCAN) 200 MG tablet once a week.     No current facility-administered medications for this visit.     Allergies  Allergen Reactions  . Antihistamines, Chlorpheniramine-Type Other (See Comments)    Alters vision  . Codeine Other (See Comments)    Confusion and dizziness  . Cyclobenzaprine Other (See Comments)    Adverse reaction - back and throat pain, dizziness, exhaustion  . Cymbalta [Duloxetine Hcl] Other (See Comments)    "body spasms and made me feel weird in my head"   . Influenza Vac Split [Flu Virus Vaccine] Other (See Comments)    "allergic to  concentrated eggs that are in vaccine"  . Prevnar [Pneumococcal 13-Val Conj Vacc] Other (See Comments)    Egg allergy Bad reaction after shot  . Saline Rash    FEVER, BURNING  . Sulfasalazine Other (See Comments)    "makes my whole body smell like sulfa & makes me sick just smelling it"  . Atorvastatin Other (  See Comments)    Severe muscle cramps to generic atorvastatin.  Able to take brand lipitor.  . Chlorhexidine Other (See Comments)    blisters  . Dilaudid [Hydromorphone Hcl] Other (See Comments)    Feels like she is burning internally   . Latex Nausea Only, Rash and Other (See Comments)    Pain and infection  . Morphine And Related Other (See Comments)    Internally feels like she is on fire   . Pravastatin Other (See Comments)    myalgias  . Prevacid [Lansoprazole] Other (See Comments)    Worsened GI side effects  . Red Dye Nausea Only and Swelling  . Rosuvastatin Other (See Comments)    Was not effective controlling lipids  . Simvastatin Other (See Comments)    Leg cramps  . Diflucan [Fluconazole] Nausea And Vomiting  . Onion     Diarrhea, GI distress, flared IBS  . Other     NOTE: pt is able to take cephalosporins without reaction  . Penicillins Other (See Comments)    CHILDHOOD ALLEGY/REACTION: "told 50 years ago I couldn't take it; might be immune to it", able to take amoxicillin Has patient had a PCN reaction causing immediate rash, facial/tongue/throat swelling, SOB or lightheadedness with hypotension: Unknown Has patient had a PCN reaction causing severe rash involving mucus membranes or skin necrosis: UnknowN Has patient had a PCN reaction that required hospitalization: /Unknown Has patient had a PCN reaction occurring within the last 10 years: Unknown If a  . Sertraline Other (See Comments)    Overly sedating  . Tape Other (See Comments)    All adhesives  - Blisters, itching and burning   . Metaxalone Other (See Comments)    REACTION: unknown    Family  History  Problem Relation Age of Onset  . Lung cancer Father        + smoker  . Colon polyps Father   . Dementia Mother   . Osteoporosis Mother        Lumbar spine  . Stroke Mother        x 5 @ 58 YOA  . Arthritis Mother        hands  . Emphysema Mother   . Alcohol abuse Paternal Grandfather   . Stroke Paternal Grandfather        ?  Marland Kitchen Heart disease Paternal Grandfather   . Hypertension Maternal Grandfather        ?  Marland Kitchen Heart disease Paternal Grandmother   . Colon cancer Neg Hx   . Esophageal cancer Neg Hx   . Gallbladder disease Neg Hx     Social History   Socioeconomic History  . Marital status: Married    Spouse name: Not on file  . Number of children: 1  . Years of education: Not on file  . Highest education level: Not on file  Occupational History  . Occupation: Oncologist  . Occupation: [employer]  Social Needs  . Financial resource strain: Not on file  . Food insecurity:    Worry: Not on file    Inability: Not on file  . Transportation needs:    Medical: Not on file    Non-medical: Not on file  Tobacco Use  . Smoking status: Never Smoker  . Smokeless tobacco: Never Used  . Tobacco comment: Lived with smokers x 23 yrs, smoked herself "for a week"  Substance and Sexual Activity  . Alcohol use: No  Alcohol/week: 0.0 standard drinks  . Drug use: No  . Sexual activity: Not Currently  Lifestyle  . Physical activity:    Days per week: Not on file    Minutes per session: Not on file  . Stress: Not on file  Relationships  . Social connections:    Talks on phone: Not on file    Gets together: Not on file    Attends religious service: Not on file    Active member of club or organization: Not on file    Attends meetings of clubs or organizations: Not on file    Relationship status: Not on file  . Intimate partner violence:    Fear of current or ex partner: Not on file    Emotionally abused: Not on file     Physically abused: Not on file    Forced sexual activity: Not on file  Other Topics Concern  . Not on file  Social History Narrative   Lives with husband Herbie [place]   1 adopted child     Constitutional: Denies fever, malaise, fatigue, headache or abrupt weight changes.  Respiratory: Denies difficulty breathing, shortness of breath, cough or sputum production.   Cardiovascular: Denies chest pain, chest tightness, palpitations or swelling in the hands or feet.  Gastrointestinal: Pt reports alternating constipation and diarrhea. Denies abdominal pain, bloating, or blood in the stool.  Neurological: Denies dizziness, difficulty with memory, difficulty with speech or problems with balance and coordination.    No other specific complaints in a complete review of systems (except as listed in HPI above).     Objective:   Physical Exam   BP 116/74   Pulse 72   Temp 97.7 F (36.5 C) (Oral)   Wt 177 lb (80.3 kg)   SpO2 97%   BMI 29.45 kg/m  Wt Readings from Last 3 Encounters:  07/02/18 177 lb 8 oz (80.5 kg)  07/01/18 177 lb (80.3 kg)  06/27/18 178 lb (80.7 kg)    General: Appears her stated age, well developed, well nourished in NAD. Cardiovascular: Normal rate and rhythm. S1,S2 noted.  No murmur, rubs or gallops noted.  Pulmonary/Chest: Normal effort and positive vesicular breath sounds. No respiratory distress. No wheezes, rales or ronchi noted.  Abdomen: Soft and nontender. Normal bowel sounds.  Neurological: Alert and oriented.    BMET    Component Value Date/Time   NA 140 06/27/2018 1153   NA 142 10/15/2017 1547   NA 137 02/14/2017 1115   K 4.5 06/27/2018 1153   K 4.7 02/14/2017 1115   CL 105 06/27/2018 1153   CO2 25 06/27/2018 1153   CO2 22 02/14/2017 1115   GLUCOSE 157 (H) 06/27/2018 1153   GLUCOSE 89 02/14/2017 1115   BUN 28 (H) 06/27/2018 1153   BUN 28 (H) 10/15/2017 1547   BUN 27.3 (H) 02/14/2017 1115   CREATININE 0.98 06/27/2018 1153   CREATININE 1.2 (H)  02/14/2017 1115   CALCIUM 9.6 06/27/2018 1153   CALCIUM 10.3 02/14/2017 1115   GFRNONAA 58 (L) 06/27/2018 1153   GFRAA >60 06/27/2018 1153    Lipid Panel     Component Value Date/Time   CHOL 197 11/09/2017 1110   TRIG 223.0 (H) 11/09/2017 1110   HDL 45.30 11/09/2017 1110   CHOLHDL 4 11/09/2017 1110   VLDL 44.6 (H) 11/09/2017 1110   LDLCALC 109 (H) 05/27/2014 0900    CBC    Component Value Date/Time   WBC 11.4 (H) 06/27/2018 1153   RBC 4.39  06/27/2018 1153   HGB 12.8 06/27/2018 1153   HGB 13.0 02/14/2017 1115   HCT 40.3 06/27/2018 1153   HCT 40.0 02/14/2017 1115   PLT 234 06/27/2018 1153   PLT 301 02/14/2017 1115   MCV 91.8 06/27/2018 1153   MCV 91.7 02/14/2017 1115   MCH 29.2 06/27/2018 1153   MCHC 31.8 06/27/2018 1153   RDW 12.8 06/27/2018 1153   RDW 13.1 02/14/2017 1115   LYMPHSABS 2.2 05/31/2018 1151   LYMPHSABS 2.3 02/14/2017 1115   MONOABS 0.9 05/31/2018 1151   MONOABS 0.9 02/14/2017 1115   EOSABS 0.1 05/31/2018 1151   EOSABS 0.1 02/14/2017 1115   BASOSABS 0.1 05/31/2018 1151   BASOSABS 0.0 02/14/2017 1115    Hgb A1C Lab Results  Component Value Date   HGBA1C 6.1 07/31/2016           Assessment & Plan:   ER Follow Up for Syncopal Episode, Left Bundle Branch Block:  ER notes, labs and ECG reviewed She is confusing a coronary artery blockage with the bundle branch block- discussed the differences between the two Discussed how straining to have a BM can cause a vagal episode and how this relates to syncope No indication to repeat labs at this time No indication for additional workup at this time Advised her to continue current meds for now She will follow up with cardiology as previously scheduled  Return precautions discussed Webb Silversmith, NP

## 2018-07-12 ENCOUNTER — Other Ambulatory Visit: Payer: Self-pay | Admitting: Family Medicine

## 2018-07-12 NOTE — Telephone Encounter (Signed)
plz phone in due to E prescribe errors.

## 2018-07-12 NOTE — Telephone Encounter (Signed)
Is it ok to do a 90-day refill?

## 2018-07-15 NOTE — Telephone Encounter (Signed)
Refill left on vm at pharmacy.  

## 2018-07-17 ENCOUNTER — Encounter: Payer: Self-pay | Admitting: *Deleted

## 2018-08-05 ENCOUNTER — Other Ambulatory Visit: Payer: Self-pay

## 2018-08-05 ENCOUNTER — Ambulatory Visit (INDEPENDENT_AMBULATORY_CARE_PROVIDER_SITE_OTHER): Payer: Medicare Other | Admitting: Family Medicine

## 2018-08-05 ENCOUNTER — Encounter: Payer: Self-pay | Admitting: Family Medicine

## 2018-08-05 VITALS — BP 132/78 | HR 81 | Temp 98.4°F | Ht 65.0 in | Wt 179.5 lb

## 2018-08-05 DIAGNOSIS — G8929 Other chronic pain: Secondary | ICD-10-CM

## 2018-08-05 DIAGNOSIS — M545 Low back pain, unspecified: Secondary | ICD-10-CM

## 2018-08-05 DIAGNOSIS — M797 Fibromyalgia: Secondary | ICD-10-CM

## 2018-08-05 DIAGNOSIS — K582 Mixed irritable bowel syndrome: Secondary | ICD-10-CM | POA: Diagnosis not present

## 2018-08-05 DIAGNOSIS — N183 Chronic kidney disease, stage 3 unspecified: Secondary | ICD-10-CM

## 2018-08-05 DIAGNOSIS — R1084 Generalized abdominal pain: Secondary | ICD-10-CM

## 2018-08-05 DIAGNOSIS — Z8719 Personal history of other diseases of the digestive system: Secondary | ICD-10-CM | POA: Diagnosis not present

## 2018-08-05 LAB — POC URINALSYSI DIPSTICK (AUTOMATED)
Bilirubin, UA: NEGATIVE
Blood, UA: NEGATIVE
Glucose, UA: NEGATIVE
Ketones, UA: NEGATIVE
Leukocytes, UA: NEGATIVE
Nitrite, UA: NEGATIVE
Protein, UA: NEGATIVE
Spec Grav, UA: 1.01 (ref 1.010–1.025)
Urobilinogen, UA: 0.2 E.U./dL
pH, UA: 6 (ref 5.0–8.0)

## 2018-08-05 LAB — COMPREHENSIVE METABOLIC PANEL
ALT: 20 U/L (ref 0–35)
AST: 17 U/L (ref 0–37)
Albumin: 4.5 g/dL (ref 3.5–5.2)
Alkaline Phosphatase: 110 U/L (ref 39–117)
BUN: 26 mg/dL — ABNORMAL HIGH (ref 6–23)
CO2: 25 mEq/L (ref 19–32)
Calcium: 9.8 mg/dL (ref 8.4–10.5)
Chloride: 102 mEq/L (ref 96–112)
Creatinine, Ser: 0.94 mg/dL (ref 0.40–1.20)
GFR: 58.36 mL/min — ABNORMAL LOW (ref >60.00)
Glucose, Bld: 83 mg/dL (ref 70–99)
Potassium: 4.5 mEq/L (ref 3.5–5.1)
Sodium: 137 mEq/L (ref 135–145)
Total Bilirubin: 0.3 mg/dL (ref 0.2–1.2)
Total Protein: 7.6 g/dL (ref 6.0–8.3)

## 2018-08-05 LAB — CBC WITH DIFFERENTIAL/PLATELET
Basophils Absolute: 0.1 10*3/uL (ref 0.0–0.1)
Basophils Relative: 1.2 % (ref 0.0–3.0)
Eosinophils Absolute: 0.1 10*3/uL (ref 0.0–0.7)
Eosinophils Relative: 0.9 % (ref 0.0–5.0)
HCT: 41.4 % (ref 36.0–46.0)
Hemoglobin: 13.8 g/dL (ref 12.0–15.0)
Lymphocytes Relative: 23.8 % (ref 12.0–46.0)
Lymphs Abs: 2.7 10*3/uL (ref 0.7–4.0)
MCHC: 33.3 g/dL (ref 30.0–36.0)
MCV: 89.5 fl (ref 78.0–100.0)
Monocytes Absolute: 1 10*3/uL (ref 0.1–1.0)
Monocytes Relative: 8.3 % (ref 3.0–12.0)
Neutro Abs: 7.5 10*3/uL (ref 1.4–7.7)
Neutrophils Relative %: 65.8 % (ref 43.0–77.0)
Platelets: 255 10*3/uL (ref 150.0–400.0)
RBC: 4.62 Mil/uL (ref 3.87–5.11)
RDW: 13.6 % (ref 11.5–15.5)
WBC: 11.5 10*3/uL — ABNORMAL HIGH (ref 4.0–10.5)

## 2018-08-05 LAB — LIPASE: Lipase: 57 U/L (ref 11.0–59.0)

## 2018-08-05 NOTE — Assessment & Plan Note (Signed)
Acute lower back pain over the past week. UA without infection. Possible MSK strain after large BM. Check labs today.

## 2018-08-05 NOTE — Patient Instructions (Addendum)
Urine looking ok today - no signs of infection.  Labs today to check kidneys and for signs of infection.  Stay at home, continue regular hand washing, stay safe.

## 2018-08-05 NOTE — Assessment & Plan Note (Addendum)
Chronic, recently worsened. Check labs today (CBC, CMP, lipase). UA without signs of infection today.

## 2018-08-05 NOTE — Assessment & Plan Note (Signed)
Update kidney function.  

## 2018-08-05 NOTE — Progress Notes (Signed)
BP 132/78 (BP Location: Left Arm, Patient Position: Sitting, Cuff Size: Normal)   Pulse 81   Temp 98.4 F (36.9 C) (Oral)   Ht 5\' 5"  (1.651 m)   Wt 179 lb 8 oz (81.4 kg)   SpO2 97%   BMI 29.87 kg/m    CC: "my back hurts" Subjective:    Patient ID: Ashley [name], female    DOB: [birth year], 73 y.o.   MRN: [id number]  HPI: Ashley [name] is a 73 y.o. female presenting on 08/05/2018 for Back Pain (C/o low back pain and foul smelling urine. Sxs started 07/31/18. )   1 wk h/o R then L flank pain worse when laying on sides at night. Worse starting 4d ago. Noticing odor to urinate. More constipated recently - started taking stool softener and miralax 1/2 capful x1. Wonders if large BM caused back strain. Increased urinary frequency noted.   Denies falls or injury or trauma. No fevers/chills. No cough, dyspnea.  No nausea, vomiting, dysuria or urgency, hematuria.  No recent abx use.  Last BM 2d ago. Passing gas well.   Friend she had lunch with last week currently hospitalized with pneumonia.      Relevant past medical, surgical, family and social history reviewed and updated as indicated. Interim medical history since our last visit reviewed. Allergies and medications reviewed and updated. Outpatient Medications Prior to Visit  Medication Sig Dispense Refill  . acetaminophen (TYLENOL) 500 MG tablet Take 500 mg by mouth daily as needed (PAIN).    Marland Kitchen albuterol (PROVENTIL HFA;VENTOLIN HFA) 108 (90 Base) MCG/ACT inhaler TAKE 2 PUFFS BY MOUTH EVERY 6 HOURS AS NEEDED FOR WHEEZE OR SHORTNESS OF BREATH 18 Inhaler 1  . AMBULATORY NON FORMULARY MEDICATION Medication Name:  1 squatty potty to use as needed 1 each 0  . Ascorbic Acid (VITAMIN C) 1000 MG tablet Take 1,000 mg by mouth daily.    Marland Kitchen aspirin EC 81 MG tablet Take 81 mg by mouth at bedtime.     Marland Kitchen BIOTIN PO Take 1 tablet by mouth daily.    . Calcium Carbonate-Vitamin D (CALCIUM + D PO) Take 1 tablet by mouth daily.    . carvedilol  (COREG) 6.25 MG tablet TAKE 1 TABLET (6.25 MG TOTAL) BY MOUTH 2 (TWO) TIMES DAILY WITH A MEAL. 180 tablet 3  . cholecalciferol (VITAMIN D) 1000 units tablet Take 2,000 Units by mouth daily.    . Coenzyme Q10 (CO Q 10 PO) Take 1 tablet by mouth daily.    Marland Kitchen dextromethorphan (DELSYM) 30 MG/5ML liquid Take 15 mg by mouth at bedtime as needed for cough.    . Diphenhyd-Hydrocort-Nystatin (FIRST-DUKES MOUTHWASH) SUSP Swish and swallow 7ml every 6 hours as needed 240 mL 0  . ezetimibe (ZETIA) 10 MG tablet TAKE 1 TABLET (10 MG TOTAL) BY MOUTH DAILY. 90 tablet 2  . fluconazole (DIFLUCAN) 200 MG tablet once a week.    Marland Kitchen GARLIC PO Take 1 tablet by mouth daily.    Marland Kitchen guaiFENesin (MUCINEX) 600 MG 12 hr tablet Take 600 mg by mouth daily.    Marland Kitchen levalbuterol (XOPENEX HFA) 45 MCG/ACT inhaler Inhale 2 puffs into the lungs every 4 (four) hours as needed for wheezing or shortness of breath. 1 Inhaler 3  . losartan (COZAAR) 25 MG tablet Take 1 tablet (25 mg total) by mouth daily. (Patient taking differently: Take 25 mg by mouth. Takes Monday, Wednesday, and Friday and Saturday) 90 tablet 3  . nystatin cream (MYCOSTATIN) Apply 1  application topically 2 (two) times daily. 60 g 1  . omega-3 acid ethyl esters (LOVAZA) 1 g capsule Take 1 g by mouth 2 (two) times daily.    Marland Kitchen omeprazole (PRILOSEC) 20 MG capsule TAKE 1 CAPSULE BY MOUTH EVERY DAY 30 capsule 1  . potassium chloride (K-DUR) 10 MEQ tablet Take 10 mEq by mouth daily as needed (with torsemide). Takes every 7-10 days    . Red Yeast Rice 600 MG CAPS Take 1 capsule (600 mg total) by mouth 2 (two) times daily.    . RESTASIS 0.05 % ophthalmic emulsion Place 1 drop into both eyes 2 (two) times daily.    Marland Kitchen spironolactone (ALDACTONE) 25 MG tablet Take 1 tablet (25 mg total) by mouth every other day. (Patient taking differently: Take 25 mg by mouth every other day. Monday, Wednesday, Friday and Saturday) 45 tablet 3  . torsemide (DEMADEX) 10 MG tablet TAKE 1 TABLET (10 MG  TOTAL) BY MOUTH DAILY AS NEEDED (SWELLING). 90 tablet 1  . vitamin B-12 (CYANOCOBALAMIN) 1000 MCG tablet Take 1 tablet (1,000 mcg total) by mouth daily.    . vitamin E 100 UNIT capsule Take 100 Units by mouth daily.     No facility-administered medications prior to visit.      Per HPI unless specifically indicated in ROS section below Review of Systems Objective:    BP 132/78 (BP Location: Left Arm, Patient Position: Sitting, Cuff Size: Normal)   Pulse 81   Temp 98.4 F (36.9 C) (Oral)   Ht 5\' 5"  (1.651 m)   Wt 179 lb 8 oz (81.4 kg)   SpO2 97%   BMI 29.87 kg/m   Wt Readings from Last 3 Encounters:  08/05/18 179 lb 8 oz (81.4 kg)  07/02/18 177 lb 8 oz (80.5 kg)  07/01/18 177 lb (80.3 kg)    Physical Exam Vitals signs and nursing note reviewed.  Constitutional:      Appearance: Normal appearance. She is not ill-appearing.  HENT:     Head: Normocephalic and atraumatic.     Mouth/Throat:     Mouth: Mucous membranes are moist.     Pharynx: No posterior oropharyngeal erythema.  Cardiovascular:     Rate and Rhythm: Normal rate and regular rhythm.     Pulses: Normal pulses.     Heart sounds: Normal heart sounds. No murmur.  Pulmonary:     Effort: Pulmonary effort is normal. No respiratory distress.     Breath sounds: Normal breath sounds. No wheezing, rhonchi or rales.  Abdominal:     General: Abdomen is flat. There is no distension.     Palpations: Abdomen is soft. There is no mass.     Tenderness: There is generalized abdominal tenderness (moderate). There is no right CVA tenderness, left CVA tenderness, guarding or rebound.     Hernia: No hernia is present.     Comments: Diffuse chronic abdominal tenderness worse at epigastric region, present throughout  Lymphadenopathy:     Cervical: No cervical adenopathy.  Skin:    Findings: No rash.  Neurological:     General: No focal deficit present.     Mental Status: She is alert.  Psychiatric:        Mood and Affect: Mood  normal.        Behavior: Behavior normal.       Results for orders placed or performed in visit on 08/05/18  POCT Urinalysis Dipstick (Automated)  Result Value Ref Range   Color, UA straw  Clarity, UA clear    Glucose, UA Negative Negative   Bilirubin, UA negative    Ketones, UA negative    Spec Grav, UA 1.010 1.010 - 1.025   Blood, UA negative    pH, UA 6.0 5.0 - 8.0   Protein, UA Negative Negative   Urobilinogen, UA 0.2 0.2 or 1.0 E.U./dL   Nitrite, UA negative    Leukocytes, UA Negative Negative   *Note: Due to a large number of results and/or encounters for the requested time period, some results have not been displayed. A complete set of results can be found in Results Review.   Assessment & Plan:   Problem List Items Addressed This Visit    Irritable bowel syndrome   History of diverticular abscess   Fibromyalgia   CKD (chronic kidney disease) stage 3, GFR 30-59 ml/min (HCC)    Update kidney function.       Chronic generalized abdominal pain    Chronic, recently worsened. Check labs today (CBC, CMP, lipase). UA without signs of infection today.       Back pain - Primary    Acute lower back pain over the past week. UA without infection. Possible MSK strain after large BM. Check labs today.       Relevant Orders   POCT Urinalysis Dipstick (Automated) (Completed)   Comprehensive metabolic panel   CBC with Differential/Platelet   Lipase       No orders of the defined types were placed in this encounter.  Orders Placed This Encounter  Procedures  . Comprehensive metabolic panel  . CBC with Differential/Platelet  . Lipase  . POCT Urinalysis Dipstick (Automated)    Follow up plan: Return if symptoms worsen or fail to improve.  [name], MD

## 2018-08-20 ENCOUNTER — Ambulatory Visit (INDEPENDENT_AMBULATORY_CARE_PROVIDER_SITE_OTHER): Payer: Medicare Other | Admitting: *Deleted

## 2018-08-20 ENCOUNTER — Other Ambulatory Visit: Payer: Self-pay

## 2018-08-20 DIAGNOSIS — I420 Dilated cardiomyopathy: Secondary | ICD-10-CM | POA: Diagnosis not present

## 2018-08-21 ENCOUNTER — Telehealth: Payer: Self-pay

## 2018-08-21 NOTE — Telephone Encounter (Signed)
Left message for patient to remind of missed remote transmission.  

## 2018-08-29 ENCOUNTER — Encounter: Payer: Self-pay | Admitting: Cardiology

## 2018-08-29 LAB — CUP PACEART REMOTE DEVICE CHECK
Battery Voltage: 2.72 V
Brady Statistic RV Percent Paced: 0 %
Date Time Interrogation Session: 20200408042208
HighPow Impedance: 72 Ohm
Implantable Lead Implant Date: 20130301
Implantable Lead Location: 753860
Implantable Lead Model: 6935
Implantable Pulse Generator Implant Date: 20130301
Lead Channel Impedance Value: 437 Ohm
Lead Channel Pacing Threshold Amplitude: 0.875 V
Lead Channel Pacing Threshold Pulse Width: 0.4 ms
Lead Channel Sensing Intrinsic Amplitude: 6.875 mV
Lead Channel Setting Pacing Amplitude: 2.5 V
Lead Channel Setting Pacing Pulse Width: 0.4 ms
Lead Channel Setting Sensing Sensitivity: 0.3 mV

## 2018-08-29 NOTE — Progress Notes (Signed)
Remote ICD transmission.   

## 2018-09-20 ENCOUNTER — Telehealth: Payer: Self-pay

## 2018-09-20 ENCOUNTER — Encounter: Payer: Self-pay | Admitting: Family Medicine

## 2018-09-20 ENCOUNTER — Ambulatory Visit (INDEPENDENT_AMBULATORY_CARE_PROVIDER_SITE_OTHER): Payer: Medicare Other | Admitting: Family Medicine

## 2018-09-20 VITALS — BP 132/78 | HR 92 | Temp 97.6°F | Ht 65.0 in | Wt 178.5 lb

## 2018-09-20 DIAGNOSIS — I5022 Chronic systolic (congestive) heart failure: Secondary | ICD-10-CM | POA: Diagnosis not present

## 2018-09-20 DIAGNOSIS — G8929 Other chronic pain: Secondary | ICD-10-CM

## 2018-09-20 DIAGNOSIS — J309 Allergic rhinitis, unspecified: Secondary | ICD-10-CM | POA: Diagnosis not present

## 2018-09-20 DIAGNOSIS — J418 Mixed simple and mucopurulent chronic bronchitis: Secondary | ICD-10-CM

## 2018-09-20 DIAGNOSIS — R5381 Other malaise: Secondary | ICD-10-CM | POA: Diagnosis not present

## 2018-09-20 DIAGNOSIS — K582 Mixed irritable bowel syndrome: Secondary | ICD-10-CM | POA: Diagnosis not present

## 2018-09-20 DIAGNOSIS — I420 Dilated cardiomyopathy: Secondary | ICD-10-CM

## 2018-09-20 DIAGNOSIS — M797 Fibromyalgia: Secondary | ICD-10-CM | POA: Diagnosis not present

## 2018-09-20 DIAGNOSIS — R1084 Generalized abdominal pain: Secondary | ICD-10-CM | POA: Diagnosis not present

## 2018-09-20 DIAGNOSIS — R5382 Chronic fatigue, unspecified: Secondary | ICD-10-CM | POA: Diagnosis not present

## 2018-09-20 LAB — CBC WITH DIFFERENTIAL/PLATELET
Basophils Absolute: 0.1 10*3/uL (ref 0.0–0.1)
Basophils Relative: 1.1 % (ref 0.0–3.0)
Eosinophils Absolute: 0.1 10*3/uL (ref 0.0–0.7)
Eosinophils Relative: 1.4 % (ref 0.0–5.0)
HCT: 39.1 % (ref 36.0–46.0)
Hemoglobin: 13.2 g/dL (ref 12.0–15.0)
Lymphocytes Relative: 20.3 % (ref 12.0–46.0)
Lymphs Abs: 2 10*3/uL (ref 0.7–4.0)
MCHC: 33.7 g/dL (ref 30.0–36.0)
MCV: 90.5 fl (ref 78.0–100.0)
Monocytes Absolute: 1 10*3/uL (ref 0.1–1.0)
Monocytes Relative: 9.8 % (ref 3.0–12.0)
Neutro Abs: 6.6 10*3/uL (ref 1.4–7.7)
Neutrophils Relative %: 67.4 % (ref 43.0–77.0)
Platelets: 228 10*3/uL (ref 150.0–400.0)
RBC: 4.32 Mil/uL (ref 3.87–5.11)
RDW: 13.4 % (ref 11.5–15.5)
WBC: 9.7 10*3/uL (ref 4.0–10.5)

## 2018-09-20 LAB — COMPREHENSIVE METABOLIC PANEL
ALT: 21 U/L (ref 0–35)
AST: 19 U/L (ref 0–37)
Albumin: 4.2 g/dL (ref 3.5–5.2)
Alkaline Phosphatase: 97 U/L (ref 39–117)
BUN: 21 mg/dL (ref 6–23)
CO2: 28 mEq/L (ref 19–32)
Calcium: 9.9 mg/dL (ref 8.4–10.5)
Chloride: 101 mEq/L (ref 96–112)
Creatinine, Ser: 0.99 mg/dL (ref 0.40–1.20)
GFR: 54.96 mL/min — ABNORMAL LOW (ref >60.00)
Glucose, Bld: 93 mg/dL (ref 70–99)
Potassium: 3.9 mEq/L (ref 3.5–5.1)
Sodium: 140 mEq/L (ref 135–145)
Total Bilirubin: 0.5 mg/dL (ref 0.2–1.2)
Total Protein: 7.5 g/dL (ref 6.0–8.3)

## 2018-09-20 LAB — LIPASE: Lipase: 22 U/L (ref 11.0–59.0)

## 2018-09-20 MED ORDER — LEVALBUTEROL TARTRATE 45 MCG/ACT IN AERO: 2.0000 | INHALATION_SPRAY | RESPIRATORY_TRACT | 3 refills | Status: DC | PRN

## 2018-09-20 NOTE — Telephone Encounter (Signed)
Pt called with multiple concerns; since first of March pt has had severe constipation to the point of manually removing stool from rectum; pt alternating colace and miralax which causes diarrhea. I thought pt had diarrhea today but pt said diarrhea stopped around 09/16/18 this time and pt is back to constipation state. Pt has prod cough with thick yellow phlegm that occasionally has small amt of bright red blood in phlegm.pt has wheezing also; Pt has COPD & CHF; when pt bends over cannot get her breath like breathing was cut off; inhaler helps. Pt said stomach is distended and hurts; no unusual wt gain in last 3 days. Pt has swelling in ankles, no more than usual but feels like water is in ankles. Swelling does go down overnight. H/A on and off; S/T all the time for years; do not think pt having chills but pt said she gets cold feeling easily. No fever but pt said she feels like she has a fever in her belly. No loss of taste and smell;no travel and no exposure to known covid or flu. Pt has not contacted GI or pulmonologist; pt does not want to go to UC. Dr Darnell Level said would work pt in office visit at 12:30 today. Pt very appreciative. Regina LPN notiified pt to come to back door and call [facility] and Lattie Haw CMA will come and assist pt in office. FYI to Dr Darnell Level.

## 2018-09-20 NOTE — Progress Notes (Signed)
This visit was conducted in person.  BP 132/78 (BP Location: Right Arm, Patient Position: Sitting, Cuff Size: Normal)   Pulse 92   Temp 97.6 F (36.4 C) (Oral)   Ht 5\' 5"  (1.651 m)   Wt 178 lb 8 oz (81 kg)   SpO2 99%   BMI 29.70 kg/m    CC: multiple concerns Subjective:    Patient ID: Ashley [name], female    DOB: 08/12/1945, 73 y.o.   MRN: [id number]  HPI: Ashley [name] is a 73 y.o. female presenting on 09/20/2018 for Constipation; Cough (C/o productive cough with yellow phlegm. Also, SOB and wheezing. Has had for yrs but worsened recently.); and Joint Swelling (C/o bilateral ankle swelling. )   Chronic constipation/diarrhea, currently more constipated despite miralax and colace - has had to self-manually disimpact. Feels abdomen is more bloated/distended. Last night had soft stool after she ate 2 prunes. Nausea after bowel movement, feels dizzy. Doesn't feel she fully empties. 1 episode of bowel incontinence this week.   Known chronic respiratory trouble (chronic bronchitis). Productive cough of yellow blood tinged mucous with mild wheezing noted this week. She has started using xopenex inhaler with benefit. Lungs/chest feels tight, increased exertional dyspnea. Trouble catching her breath when she bends over. No fevers/chills. No covid exposure. No other respiratory symptoms. She does take mucinex daily - has recently increased to twice daily as well as nasonex and delsym and cherry cough drops. No significant UTI sxs of dysuria, hematuria. No dysphagia. Some early satiety. No significant GERD symptoms. No boring pain to back. Has not taken omeprazole 20mg  for 2 yrs.   Noting increased dependent lateral ankle swelling that improves overnight - no swelling of legs. Continues torsemide about once a week, spironolactone 25mg  MWF.   Sees Dr Yves Dill at [place].      Relevant past medical, surgical, family and social history reviewed and updated as indicated. Interim medical  history since our last visit reviewed. Allergies and medications reviewed and updated. Outpatient Medications Prior to Visit  Medication Sig Dispense Refill  . acetaminophen (TYLENOL) 500 MG tablet Take 500 mg by mouth daily as needed (PAIN).    Marland Kitchen albuterol (PROVENTIL HFA;VENTOLIN HFA) 108 (90 Base) MCG/ACT inhaler TAKE 2 PUFFS BY MOUTH EVERY 6 HOURS AS NEEDED FOR WHEEZE OR SHORTNESS OF BREATH 18 Inhaler 1  . AMBULATORY NON FORMULARY MEDICATION Medication Name:  1 squatty potty to use as needed 1 each 0  . Ascorbic Acid (VITAMIN C) 1000 MG tablet Take 1,000 mg by mouth daily.    Marland Kitchen aspirin EC 81 MG tablet Take 81 mg by mouth at bedtime.     Marland Kitchen BIOTIN PO Take 1 tablet by mouth daily.    . Calcium Carbonate-Vitamin D (CALCIUM + D PO) Take 1 tablet by mouth daily.    . carvedilol (COREG) 6.25 MG tablet TAKE 1 TABLET (6.25 MG TOTAL) BY MOUTH 2 (TWO) TIMES DAILY WITH A MEAL. 180 tablet 3  . cholecalciferol (VITAMIN D) 1000 units tablet Take 2,000 Units by mouth daily.    . Coenzyme Q10 (CO Q 10 PO) Take 1 tablet by mouth daily.    Marland Kitchen dextromethorphan (DELSYM) 30 MG/5ML liquid Take 15 mg by mouth at bedtime as needed for cough.    . Diphenhyd-Hydrocort-Nystatin (FIRST-DUKES MOUTHWASH) SUSP Swish and swallow 69ml every 6 hours as needed 240 mL 0  . ezetimibe (ZETIA) 10 MG tablet TAKE 1 TABLET (10 MG TOTAL) BY MOUTH DAILY. 90 tablet 2  .  fluconazole (DIFLUCAN) 200 MG tablet once a week.    Marland Kitchen GARLIC PO Take 1 tablet by mouth daily.    Marland Kitchen guaiFENesin (MUCINEX) 600 MG 12 hr tablet Take 600 mg by mouth daily.    Marland Kitchen losartan (COZAAR) 25 MG tablet Take 1 tablet (25 mg total) by mouth daily. (Patient taking differently: Take 25 mg by mouth. Takes Monday, Wednesday, and Friday and Saturday) 90 tablet 3  . nystatin cream (MYCOSTATIN) Apply 1 application topically 2 (two) times daily. 60 g 1  . omega-3 acid ethyl esters (LOVAZA) 1 g capsule Take 1 g by mouth 2 (two) times daily.    Marland Kitchen omeprazole (PRILOSEC) 20 MG  capsule TAKE 1 CAPSULE BY MOUTH EVERY DAY 30 capsule 1  . potassium chloride (K-DUR) 10 MEQ tablet Take 10 mEq by mouth daily as needed (with torsemide). Takes every 7-10 days    . Red Yeast Rice 600 MG CAPS Take 1 capsule (600 mg total) by mouth 2 (two) times daily.    . RESTASIS 0.05 % ophthalmic emulsion Place 1 drop into both eyes 2 (two) times daily.    Marland Kitchen spironolactone (ALDACTONE) 25 MG tablet Take 1 tablet (25 mg total) by mouth every other day. (Patient taking differently: Take 25 mg by mouth every other day. Monday, Wednesday, Friday and Saturday) 45 tablet 3  . torsemide (DEMADEX) 10 MG tablet TAKE 1 TABLET (10 MG TOTAL) BY MOUTH DAILY AS NEEDED (SWELLING). 90 tablet 1  . vitamin B-12 (CYANOCOBALAMIN) 1000 MCG tablet Take 1 tablet (1,000 mcg total) by mouth daily.    . vitamin E 100 UNIT capsule Take 100 Units by mouth daily.    Marland Kitchen levalbuterol (XOPENEX HFA) 45 MCG/ACT inhaler Inhale 2 puffs into the lungs every 4 (four) hours as needed for wheezing or shortness of breath. 1 Inhaler 3   No facility-administered medications prior to visit.      Per HPI unless specifically indicated in ROS section below Review of Systems Objective:    BP 132/78 (BP Location: Right Arm, Patient Position: Sitting, Cuff Size: Normal)   Pulse 92   Temp 97.6 F (36.4 C) (Oral)   Ht 5\' 5"  (1.651 m)   Wt 178 lb 8 oz (81 kg)   SpO2 99%   BMI 29.70 kg/m   Wt Readings from Last 3 Encounters:  09/20/18 178 lb 8 oz (81 kg)  08/05/18 179 lb 8 oz (81.4 kg)  07/02/18 177 lb 8 oz (80.5 kg)    Physical Exam    Assessment & Plan:   Problem List Items Addressed This Visit    Irritable bowel syndrome   Fibromyalgia    Contributes to chronic conditions.       Chronic systolic CHF (congestive heart failure) (HCC)   Chronic generalized abdominal pain - Primary    Chronic issue, struggling with bowel habits. Discussed possible cause of alternating diarrhea/constipation - scarring from her prior surgery may  lead to impaired peristalsis. Will r/o acute abd process with labs today, discussed possible return to [facility] for further eval, recs on bowel regimen if unrevealing evaluation today. I wonder if she may benefit from adding stimulant laxative to her regimen. sxs not consistent with UTI at this time.       Relevant Orders   Comprehensive metabolic panel   CBC with Differential/Platelet   Lipase   Chronic fatigue and malaise   Chronic bronchitis (HCC)    Overall benign vitals and exam today.  Doubt COPD exacerbation at  this time.  Anticipate respiratory symptoms coming from allergic rhinitis exacerbation. rec nasal saline irrigation throughout the day. Discussed possibly taking short prednisone taper if not improving with above.       Cardiomyopathy, dilated, nonischemic (HCC)   Allergic rhinitis    Possible contribution to worsening respiratory symptoms. Refilled xopenex, continue nasal steroid, rec start nasal saline, discussed allergen avoidance.           Meds ordered this encounter  Medications  . levalbuterol (XOPENEX HFA) 45 MCG/ACT inhaler    Sig: Inhale 2 puffs into the lungs every 4 (four) hours as needed for wheezing or shortness of breath.    Dispense:  1 Inhaler    Refill:  3   Orders Placed This Encounter  Procedures  . Comprehensive metabolic panel  . CBC with Differential/Platelet  . Lipase    Follow up plan: No follow-ups on file.  [name], MD

## 2018-09-20 NOTE — Assessment & Plan Note (Addendum)
Overall benign vitals and exam today.  Doubt COPD exacerbation at this time.  Anticipate respiratory symptoms coming from allergic rhinitis exacerbation. rec nasal saline irrigation throughout the day. Discussed possibly taking short prednisone taper if not improving with above.

## 2018-09-20 NOTE — Assessment & Plan Note (Signed)
Possible contribution to worsening respiratory symptoms. Refilled xopenex, continue nasal steroid, rec start nasal saline, discussed allergen avoidance.

## 2018-09-20 NOTE — Assessment & Plan Note (Signed)
Chronic issue, struggling with bowel habits. Discussed possible cause of alternating diarrhea/constipation - scarring from her prior surgery may lead to impaired peristalsis. Will r/o acute abd process with labs today, discussed possible return to [facility] for further eval, recs on bowel regimen if unrevealing evaluation today. I wonder if she may benefit from adding stimulant laxative to her regimen. sxs not consistent with UTI at this time.

## 2018-09-20 NOTE — Telephone Encounter (Signed)
Will see in office today

## 2018-09-20 NOTE — Patient Instructions (Addendum)
Ok to try GasX. Add nasal saline irrigation to respiratory regimen.  Continue xopenex inhaler 2-3 times a day as needed.  If needed, next step we could try prednisone taper (40mg  for 3 days followed by 20mg  for 3 days). Let me know if you need prescription.  Labs today. We will be in touch with results.

## 2018-09-20 NOTE — Assessment & Plan Note (Signed)
Contributes to chronic conditions.

## 2018-09-23 DIAGNOSIS — R809 Proteinuria, unspecified: Secondary | ICD-10-CM | POA: Diagnosis not present

## 2018-09-23 DIAGNOSIS — D472 Monoclonal gammopathy: Secondary | ICD-10-CM | POA: Diagnosis not present

## 2018-10-01 DIAGNOSIS — R140 Abdominal distension (gaseous): Secondary | ICD-10-CM | POA: Diagnosis not present

## 2018-10-01 DIAGNOSIS — M79645 Pain in left finger(s): Secondary | ICD-10-CM | POA: Diagnosis not present

## 2018-10-01 DIAGNOSIS — K5909 Other constipation: Secondary | ICD-10-CM | POA: Diagnosis not present

## 2018-10-01 DIAGNOSIS — M19041 Primary osteoarthritis, right hand: Secondary | ICD-10-CM | POA: Diagnosis not present

## 2018-10-01 DIAGNOSIS — M6289 Other specified disorders of muscle: Secondary | ICD-10-CM | POA: Diagnosis not present

## 2018-10-01 DIAGNOSIS — M65341 Trigger finger, right ring finger: Secondary | ICD-10-CM | POA: Diagnosis not present

## 2018-10-09 ENCOUNTER — Ambulatory Visit: Payer: Self-pay | Admitting: *Deleted

## 2018-10-09 ENCOUNTER — Other Ambulatory Visit: Payer: Medicare Other

## 2018-10-09 ENCOUNTER — Other Ambulatory Visit: Payer: Self-pay

## 2018-10-09 ENCOUNTER — Telehealth: Payer: Self-pay | Admitting: Family Medicine

## 2018-10-09 ENCOUNTER — Encounter: Payer: Self-pay | Admitting: Family Medicine

## 2018-10-09 ENCOUNTER — Ambulatory Visit (INDEPENDENT_AMBULATORY_CARE_PROVIDER_SITE_OTHER): Payer: Medicare Other | Admitting: Family Medicine

## 2018-10-09 VITALS — BP 134/78 | HR 96 | Temp 97.9°F | Ht 65.0 in | Wt 177.4 lb

## 2018-10-09 DIAGNOSIS — R0602 Shortness of breath: Secondary | ICD-10-CM | POA: Diagnosis not present

## 2018-10-09 DIAGNOSIS — J418 Mixed simple and mucopurulent chronic bronchitis: Secondary | ICD-10-CM | POA: Diagnosis not present

## 2018-10-09 DIAGNOSIS — Z20822 Contact with and (suspected) exposure to covid-19: Secondary | ICD-10-CM

## 2018-10-09 DIAGNOSIS — R6889 Other general symptoms and signs: Secondary | ICD-10-CM | POA: Diagnosis not present

## 2018-10-09 MED ORDER — PREDNISONE 20 MG PO TABS: ORAL_TABLET | ORAL | 0 refills | Status: DC

## 2018-10-09 MED ORDER — LEVOFLOXACIN 500 MG PO TABS: 500.0000 mg | ORAL_TABLET | Freq: Every day | ORAL | 0 refills | Status: DC

## 2018-10-09 NOTE — Telephone Encounter (Signed)
Pt has been scheduled for COVID-19 testing.   Requested by: PCP

## 2018-10-09 NOTE — Assessment & Plan Note (Addendum)
Anticipate acute exacerbation of chronic bronchitis due to increased dyspnea, productive cough and sputum production. Treat with prednisone taper and levaquin course. See below.

## 2018-10-09 NOTE — Assessment & Plan Note (Signed)
Progressive over the past 2 weeks with increased cough. In setting of current Covid19 pandemic and high risk status, I did recommend she be tested outpatient and have sent a staff message to [facility] community testing pool. Pt agrees with plan.

## 2018-10-09 NOTE — Progress Notes (Signed)
This visit was conducted in person.  BP 134/78 (BP Location: Right Arm, Patient Position: Sitting, Cuff Size: Normal)   Pulse 96   Temp 97.9 F (36.6 C) (Oral)   Ht 5\' 5"  (1.651 m)   Wt 177 lb 7 oz (80.5 kg)   SpO2 95%   BMI 29.53 kg/m    CC: dyspnea, wheezing Subjective:    Patient ID: Ashley [name], female    DOB: Sep 12, 1945, 73 y.o.   MRN: [id number]  HPI: Ashley [name] is a 73 y.o. female presenting on 10/09/2018 for Shortness of Breath (C/o SOB and wheezing. )   Known COPD (chronic bronchitis). Seen 09/20/2018 at which time I did not feel she had active COPD exacerbation but allergic rhinitis (treated with nasal saline irrigation and allergen avoidance measures).   Over the last 2 weeks she endorses worsening dyspnea not resolved with xopenex use. Increased dyspnea, wheezing, headache. Increased productive cough with increased sputum production. Some mid sternal chest soreness into the back - worse with cough. Increased PRN xopenex use this week than in the past month. Throat stays sore.   No fevers/chills, loss of taste or smell, nausea/vomiting or diarrhea.  No known Covid19 exposure.  Husband feels ok.   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 Planned f/u Q6 mo.  Also saw [facility] this month, note reviewed. Planned miralax cleanse and anorectal manometry.      Relevant past medical, surgical, family and social history reviewed and updated as indicated. Interim medical history since our last visit reviewed. Allergies and medications reviewed and updated. Outpatient Medications Prior to Visit  Medication Sig Dispense Refill  . acetaminophen (TYLENOL) 500 MG tablet Take 500 mg by mouth daily as needed (PAIN).    Marland Kitchen albuterol (PROVENTIL HFA;VENTOLIN HFA) 108 (90 Base) MCG/ACT inhaler TAKE 2 PUFFS BY MOUTH EVERY 6 HOURS AS NEEDED FOR WHEEZE OR SHORTNESS OF BREATH 18 Inhaler 1  . AMBULATORY NON FORMULARY MEDICATION Medication Name:  1 squatty  potty to use as needed 1 each 0  . Ascorbic Acid (VITAMIN C) 1000 MG tablet Take 1,000 mg by mouth daily.    Marland Kitchen aspirin EC 81 MG tablet Take 81 mg by mouth at bedtime.     Marland Kitchen BIOTIN PO Take 1 tablet by mouth daily.    . Calcium Carbonate-Vitamin D (CALCIUM + D PO) Take 1 tablet by mouth daily.    . carvedilol (COREG) 6.25 MG tablet TAKE 1 TABLET (6.25 MG TOTAL) BY MOUTH 2 (TWO) TIMES DAILY WITH A MEAL. 180 tablet 3  . cholecalciferol (VITAMIN D) 1000 units tablet Take 2,000 Units by mouth daily.    . Coenzyme Q10 (CO Q 10 PO) Take 1 tablet by mouth daily.    Marland Kitchen dextromethorphan (DELSYM) 30 MG/5ML liquid Take 15 mg by mouth at bedtime as needed for cough.    . Diphenhyd-Hydrocort-Nystatin (FIRST-DUKES MOUTHWASH) SUSP Swish and swallow 36ml every 6 hours as needed 240 mL 0  . ezetimibe (ZETIA) 10 MG tablet TAKE 1 TABLET (10 MG TOTAL) BY MOUTH DAILY. 90 tablet 2  . fluconazole (DIFLUCAN) 200 MG tablet once a week.    Marland Kitchen GARLIC PO Take 1 tablet by mouth daily.    Marland Kitchen guaiFENesin (MUCINEX) 600 MG 12 hr tablet Take 600 mg by mouth daily.    Marland Kitchen levalbuterol (XOPENEX HFA) 45 MCG/ACT inhaler Inhale 2 puffs into the lungs every 4 (four) hours as needed for wheezing or shortness of breath. 1  Inhaler 3  . losartan (COZAAR) 25 MG tablet Take 1 tablet (25 mg total) by mouth daily. (Patient taking differently: Take 25 mg by mouth. Takes Monday, Wednesday, and Friday and Saturday) 90 tablet 3  . nystatin cream (MYCOSTATIN) Apply 1 application topically 2 (two) times daily. 60 g 1  . omega-3 acid ethyl esters (LOVAZA) 1 g capsule Take 1 g by mouth 2 (two) times daily.    Marland Kitchen omeprazole (PRILOSEC) 20 MG capsule TAKE 1 CAPSULE BY MOUTH EVERY DAY 30 capsule 1  . potassium chloride (K-DUR) 10 MEQ tablet Take 10 mEq by mouth daily as needed (with torsemide). Takes every 7-10 days    . Red Yeast Rice 600 MG CAPS Take 1 capsule (600 mg total) by mouth 2 (two) times daily.    . RESTASIS 0.05 % ophthalmic emulsion Place 1 drop  into both eyes 2 (two) times daily.    Marland Kitchen spironolactone (ALDACTONE) 25 MG tablet Take 1 tablet (25 mg total) by mouth every other day. (Patient taking differently: Take 25 mg by mouth every other day. Monday, Wednesday, Friday and Saturday) 45 tablet 3  . torsemide (DEMADEX) 10 MG tablet TAKE 1 TABLET (10 MG TOTAL) BY MOUTH DAILY AS NEEDED (SWELLING). 90 tablet 1  . vitamin B-12 (CYANOCOBALAMIN) 1000 MCG tablet Take 1 tablet (1,000 mcg total) by mouth daily.    . vitamin E 100 UNIT capsule Take 100 Units by mouth daily.     No facility-administered medications prior to visit.      Per HPI unless specifically indicated in ROS section below Review of Systems Objective:    BP 134/78 (BP Location: Right Arm, Patient Position: Sitting, Cuff Size: Normal)   Pulse 96   Temp 97.9 F (36.6 C) (Oral)   Ht 5\' 5"  (1.651 m)   Wt 177 lb 7 oz (80.5 kg)   SpO2 95%   BMI 29.53 kg/m   Wt Readings from Last 3 Encounters:  10/09/18 177 lb 7 oz (80.5 kg)  09/20/18 178 lb 8 oz (81 kg)  08/05/18 179 lb 8 oz (81.4 kg)    Physical Exam Vitals signs and nursing note reviewed.  Constitutional:      Appearance: Normal appearance. She is well-developed. She is not ill-appearing.  HENT:     Head: Normocephalic and atraumatic.     Right Ear: Hearing, tympanic membrane, ear canal and external ear normal.     Left Ear: Hearing, tympanic membrane, ear canal and external ear normal.     Nose: Nose normal. No mucosal edema, congestion or rhinorrhea.     Right Sinus: No maxillary sinus tenderness or frontal sinus tenderness.     Left Sinus: No maxillary sinus tenderness or frontal sinus tenderness.     Mouth/Throat:     Mouth: Mucous membranes are moist.     Pharynx: Uvula midline. No oropharyngeal exudate or posterior oropharyngeal erythema.     Tonsils: No tonsillar abscesses.  Eyes:     General: No scleral icterus.    Extraocular Movements: Extraocular movements intact.     Conjunctiva/sclera:  Conjunctivae normal.     Pupils: Pupils are equal, round, and reactive to light.  Neck:     Musculoskeletal: Normal range of motion and neck supple.  Cardiovascular:     Rate and Rhythm: Normal rate and regular rhythm.     Pulses: Normal pulses.     Heart sounds: Normal heart sounds. No murmur.  Pulmonary:     Effort: Pulmonary effort is normal.  No accessory muscle usage, prolonged expiration or respiratory distress.     Breath sounds: No decreased breath sounds, wheezing, rhonchi or rales.     Comments: LLL crackles, mild shortness of breath with ambulation Musculoskeletal: Normal range of motion.     Right lower leg: No edema.     Left lower leg: No edema.  Lymphadenopathy:     Cervical: No cervical adenopathy.  Skin:    General: Skin is warm and dry.     Findings: No rash.  Neurological:     Mental Status: She is alert.  Psychiatric:        Mood and Affect: Mood normal.        Behavior: Behavior normal.       Assessment & Plan:   Problem List Items Addressed This Visit    Dyspnea    Progressive over the past 2 weeks with increased cough. In setting of current Covid19 pandemic and high risk status, I did recommend she be tested outpatient and have sent a staff message to [facility] community testing pool. Pt agrees with plan.       Chronic bronchitis (HCC) - Primary    Anticipate acute exacerbation of chronic bronchitis due to increased dyspnea, productive cough and sputum production. Treat with prednisone taper and levaquin course. See below.           Meds ordered this encounter  Medications  . predniSONE (DELTASONE) 20 MG tablet    Sig: Take two tablets daily for 3 days followed by one tablet daily for 4 days    Dispense:  10 tablet    Refill:  0  . levofloxacin (LEVAQUIN) 500 MG tablet    Sig: Take 1 tablet (500 mg total) by mouth daily.    Dispense:  7 tablet    Refill:  0   No orders of the defined types were placed in this encounter.   Follow up plan: No  follow-ups on file.  [name], MD

## 2018-10-09 NOTE — Addendum Note (Signed)
Addended by: Dimple Nanas on: 10/09/2018 02:38 PM   Modules accepted: Orders

## 2018-10-09 NOTE — Telephone Encounter (Signed)
Pt reports increased SOB past 2 weeks, worsening last night and this AM. States used inhalers with minimal relief. States "Squeaky wheezing this am, now I feel like my chest has crackling." Reports SOB at rest and with exertion. Audible wheezing noted at times during call. Pts BP 136/68, HR 95 per pts report. States afebrile. reports increased fatigue. H/O CHF, BBB. [place] called practice and spoke to [place]. Appt secured for today at 1330. Pt given instructions once arrives at office, verbalizes understanding. Care advise given; instructed to CB if symptoms worsen.  Reason for Disposition . [1] MODERATE difficulty breathing (e.g., speaks in phrases, SOB even at rest, pulse 100-120) AND [2] NEW-onset or WORSE than normal  Answer Assessment - Initial Assessment Questions 1. RESPIRATORY STATUS: "Describe your breathing?" (e.g., wheezing, shortness of breath, unable to speak, severe coughing)      Sob at rest and with exertion 2. ONSET: "When did this breathing problem begin?"      2 weeks ago, worsened last night 3. PATTERN "Does the difficult breathing come and go, or has it been constant since it started?"     constant 4. SEVERITY: "How bad is your breathing?" (e.g., mild, moderate, severe)    - MILD: No SOB at rest, mild SOB with walking, speaks normally in sentences, can lay down, no retractions, pulse < 100.    - MODERATE: SOB at rest, SOB with minimal exertion and prefers to sit, cannot lie down flat, speaks in phrases, mild retractions, audible wheezing, pulse 100-120.    - SEVERE: Very SOB at rest, speaks in single words, struggling to breathe, sitting hunched forward, retractions, pulse > 120      moderate 5. RECURRENT SYMPTOM: "Have you had difficulty breathing before?" If so, ask: "When was the last time?" and "What happened that time?"      Yes 2 weeks ago, 6. CARDIAC HISTORY: "Do you have any history of heart disease?" (e.g., heart attack, angina, bypass surgery, angioplasty)      CHF,  BBB 7. LUNG HISTORY: "Do you have any history of lung disease?"  (e.g., pulmonary embolus, asthma, emphysema)    allergies 8. CAUSE: "What do you think is causing the breathing problem?"      unsure 9. OTHER SYMPTOMS: "Do you have any other symptoms? (e.g., dizziness, runny nose, cough, chest pain, fever)     No fever, "Feels crackling in chest" 11. TRAVEL: "Have you traveled out of the country in the last month?" (e.g., travel history, exposures)       no  Protocols used: BREATHING DIFFICULTY-A-AH

## 2018-10-09 NOTE — Telephone Encounter (Signed)
FYI patient has appointment today at 1:30pm.

## 2018-10-09 NOTE — Telephone Encounter (Signed)
Seen today. 

## 2018-10-09 NOTE — Patient Instructions (Addendum)
I think you have COPD flare. Treat with steroid and antibiotic course (levaquin).  I do want you to get Covid testing - we will call you to schedule this.

## 2018-10-10 ENCOUNTER — Ambulatory Visit: Payer: Medicare Other | Admitting: Family Medicine

## 2018-10-11 LAB — NOVEL CORONAVIRUS, NAA: SARS-CoV-2, NAA: NOT DETECTED

## 2018-10-14 ENCOUNTER — Telehealth: Payer: Self-pay | Admitting: *Deleted

## 2018-10-14 ENCOUNTER — Encounter (HOSPITAL_COMMUNITY): Payer: Self-pay | Admitting: *Deleted

## 2018-10-14 ENCOUNTER — Emergency Department (HOSPITAL_COMMUNITY): Payer: Medicare Other

## 2018-10-14 ENCOUNTER — Telehealth: Payer: Self-pay | Admitting: Cardiovascular Disease

## 2018-10-14 ENCOUNTER — Inpatient Hospital Stay (HOSPITAL_COMMUNITY)
Admission: EM | Admit: 2018-10-14 | Discharge: 2018-10-19 | DRG: 286 | Disposition: A | Discharge status: Home / Self Care | Payer: Medicare Other | Attending: Internal Medicine | Admitting: Internal Medicine

## 2018-10-14 ENCOUNTER — Other Ambulatory Visit: Payer: Self-pay

## 2018-10-14 ENCOUNTER — Inpatient Hospital Stay (HOSPITAL_COMMUNITY): Payer: Medicare Other

## 2018-10-14 DIAGNOSIS — I700 Atherosclerosis of aorta: Secondary | ICD-10-CM | POA: Diagnosis present

## 2018-10-14 DIAGNOSIS — I110 Hypertensive heart disease with heart failure: Secondary | ICD-10-CM | POA: Diagnosis not present

## 2018-10-14 DIAGNOSIS — K219 Gastro-esophageal reflux disease without esophagitis: Secondary | ICD-10-CM | POA: Diagnosis present

## 2018-10-14 DIAGNOSIS — M797 Fibromyalgia: Secondary | ICD-10-CM | POA: Diagnosis present

## 2018-10-14 DIAGNOSIS — I340 Nonrheumatic mitral (valve) insufficiency: Secondary | ICD-10-CM | POA: Diagnosis present

## 2018-10-14 DIAGNOSIS — J449 Chronic obstructive pulmonary disease, unspecified: Secondary | ICD-10-CM | POA: Diagnosis present

## 2018-10-14 DIAGNOSIS — I959 Hypotension, unspecified: Secondary | ICD-10-CM | POA: Diagnosis present

## 2018-10-14 DIAGNOSIS — R0603 Acute respiratory distress: Secondary | ICD-10-CM | POA: Diagnosis present

## 2018-10-14 DIAGNOSIS — D472 Monoclonal gammopathy: Secondary | ICD-10-CM | POA: Diagnosis present

## 2018-10-14 DIAGNOSIS — Q245 Malformation of coronary vessels: Secondary | ICD-10-CM | POA: Diagnosis not present

## 2018-10-14 DIAGNOSIS — M810 Age-related osteoporosis without current pathological fracture: Secondary | ICD-10-CM | POA: Diagnosis present

## 2018-10-14 DIAGNOSIS — I130 Hypertensive heart and chronic kidney disease with heart failure and stage 1 through stage 4 chronic kidney disease, or unspecified chronic kidney disease: Secondary | ICD-10-CM | POA: Diagnosis present

## 2018-10-14 DIAGNOSIS — Z91012 Allergy to eggs: Secondary | ICD-10-CM

## 2018-10-14 DIAGNOSIS — R000 Tachycardia, unspecified: Secondary | ICD-10-CM | POA: Diagnosis not present

## 2018-10-14 DIAGNOSIS — Z9109 Other allergy status, other than to drugs and biological substances: Secondary | ICD-10-CM

## 2018-10-14 DIAGNOSIS — Z90710 Acquired absence of both cervix and uterus: Secondary | ICD-10-CM | POA: Diagnosis not present

## 2018-10-14 DIAGNOSIS — E782 Mixed hyperlipidemia: Secondary | ICD-10-CM | POA: Diagnosis present

## 2018-10-14 DIAGNOSIS — I5043 Acute on chronic combined systolic (congestive) and diastolic (congestive) heart failure: Secondary | ICD-10-CM | POA: Diagnosis present

## 2018-10-14 DIAGNOSIS — F419 Anxiety disorder, unspecified: Secondary | ICD-10-CM | POA: Diagnosis present

## 2018-10-14 DIAGNOSIS — I472 Ventricular tachycardia: Secondary | ICD-10-CM | POA: Diagnosis present

## 2018-10-14 DIAGNOSIS — Z79899 Other long term (current) drug therapy: Secondary | ICD-10-CM

## 2018-10-14 DIAGNOSIS — Z885 Allergy status to narcotic agent status: Secondary | ICD-10-CM

## 2018-10-14 DIAGNOSIS — Z79890 Hormone replacement therapy (postmenopausal): Secondary | ICD-10-CM | POA: Diagnosis not present

## 2018-10-14 DIAGNOSIS — Z887 Allergy status to serum and vaccine status: Secondary | ICD-10-CM

## 2018-10-14 DIAGNOSIS — K5900 Constipation, unspecified: Secondary | ICD-10-CM | POA: Diagnosis present

## 2018-10-14 DIAGNOSIS — Z8261 Family history of arthritis: Secondary | ICD-10-CM

## 2018-10-14 DIAGNOSIS — Z20828 Contact with and (suspected) exposure to other viral communicable diseases: Secondary | ICD-10-CM | POA: Diagnosis present

## 2018-10-14 DIAGNOSIS — Z6829 Body mass index (BMI) 29.0-29.9, adult: Secondary | ICD-10-CM

## 2018-10-14 DIAGNOSIS — R0902 Hypoxemia: Secondary | ICD-10-CM | POA: Diagnosis present

## 2018-10-14 DIAGNOSIS — R0602 Shortness of breath: Secondary | ICD-10-CM | POA: Diagnosis not present

## 2018-10-14 DIAGNOSIS — Z7982 Long term (current) use of aspirin: Secondary | ICD-10-CM

## 2018-10-14 DIAGNOSIS — Z91040 Latex allergy status: Secondary | ICD-10-CM | POA: Diagnosis not present

## 2018-10-14 DIAGNOSIS — I428 Other cardiomyopathies: Secondary | ICD-10-CM | POA: Diagnosis present

## 2018-10-14 DIAGNOSIS — Z823 Family history of stroke: Secondary | ICD-10-CM

## 2018-10-14 DIAGNOSIS — I509 Heart failure, unspecified: Secondary | ICD-10-CM

## 2018-10-14 DIAGNOSIS — Z9102 Food additives allergy status: Secondary | ICD-10-CM

## 2018-10-14 DIAGNOSIS — E669 Obesity, unspecified: Secondary | ICD-10-CM | POA: Diagnosis present

## 2018-10-14 DIAGNOSIS — Z91018 Allergy to other foods: Secondary | ICD-10-CM

## 2018-10-14 DIAGNOSIS — Z8371 Family history of colonic polyps: Secondary | ICD-10-CM

## 2018-10-14 DIAGNOSIS — N183 Chronic kidney disease, stage 3 unspecified: Secondary | ICD-10-CM | POA: Diagnosis present

## 2018-10-14 DIAGNOSIS — I5023 Acute on chronic systolic (congestive) heart failure: Secondary | ICD-10-CM | POA: Diagnosis not present

## 2018-10-14 DIAGNOSIS — Z880 Allergy status to penicillin: Secondary | ICD-10-CM

## 2018-10-14 DIAGNOSIS — Z95810 Presence of automatic (implantable) cardiac defibrillator: Secondary | ICD-10-CM

## 2018-10-14 DIAGNOSIS — Z933 Colostomy status: Secondary | ICD-10-CM

## 2018-10-14 DIAGNOSIS — E785 Hyperlipidemia, unspecified: Secondary | ICD-10-CM | POA: Diagnosis not present

## 2018-10-14 DIAGNOSIS — Z801 Family history of malignant neoplasm of trachea, bronchus and lung: Secondary | ICD-10-CM

## 2018-10-14 DIAGNOSIS — N289 Disorder of kidney and ureter, unspecified: Secondary | ICD-10-CM | POA: Diagnosis present

## 2018-10-14 DIAGNOSIS — Z8249 Family history of ischemic heart disease and other diseases of the circulatory system: Secondary | ICD-10-CM

## 2018-10-14 DIAGNOSIS — Z8262 Family history of osteoporosis: Secondary | ICD-10-CM

## 2018-10-14 DIAGNOSIS — I272 Pulmonary hypertension, unspecified: Secondary | ICD-10-CM | POA: Diagnosis present

## 2018-10-14 DIAGNOSIS — R079 Chest pain, unspecified: Secondary | ICD-10-CM | POA: Diagnosis not present

## 2018-10-14 DIAGNOSIS — Z90722 Acquired absence of ovaries, bilateral: Secondary | ICD-10-CM

## 2018-10-14 DIAGNOSIS — Z825 Family history of asthma and other chronic lower respiratory diseases: Secondary | ICD-10-CM

## 2018-10-14 DIAGNOSIS — I447 Left bundle-branch block, unspecified: Secondary | ICD-10-CM | POA: Diagnosis present

## 2018-10-14 LAB — BASIC METABOLIC PANEL
Anion gap: 13 (ref 5–15)
BUN: 19 mg/dL (ref 8–23)
CO2: 23 mmol/L (ref 22–32)
Calcium: 10 mg/dL (ref 8.9–10.3)
Chloride: 103 mmol/L (ref 98–111)
Creatinine, Ser: 1.2 mg/dL — ABNORMAL HIGH (ref 0.44–1.00)
GFR calc Af Amer: 52 mL/min — ABNORMAL LOW (ref >60)
GFR calc non Af Amer: 45 mL/min — ABNORMAL LOW (ref >60)
Glucose, Bld: 100 mg/dL — ABNORMAL HIGH (ref 70–99)
Potassium: 3.8 mmol/L (ref 3.5–5.1)
Sodium: 139 mmol/L (ref 135–145)

## 2018-10-14 LAB — CBC
HCT: 39.5 % (ref 36.0–46.0)
Hemoglobin: 12.6 g/dL (ref 12.0–15.0)
MCH: 29.9 pg (ref 26.0–34.0)
MCHC: 31.9 g/dL (ref 30.0–36.0)
MCV: 93.6 fL (ref 80.0–100.0)
Platelets: 182 10*3/uL (ref 150–400)
RBC: 4.22 MIL/uL (ref 3.87–5.11)
RDW: 12.7 % (ref 11.5–15.5)
WBC: 16.4 10*3/uL — ABNORMAL HIGH (ref 4.0–10.5)
nRBC: 0 % (ref 0.0–0.2)

## 2018-10-14 LAB — BRAIN NATRIURETIC PEPTIDE: B Natriuretic Peptide: 774.4 pg/mL — ABNORMAL HIGH (ref 0.0–100.0)

## 2018-10-14 LAB — TROPONIN I
Troponin I: <0.03 ng/mL (ref <0.03)
Troponin I: 0.03 ng/mL (ref <0.03)

## 2018-10-14 LAB — ECHOCARDIOGRAM COMPLETE

## 2018-10-14 LAB — MRSA PCR SCREENING: MRSA by PCR: NEGATIVE

## 2018-10-14 LAB — SARS CORONAVIRUS 2 BY RT PCR (HOSPITAL ORDER, PERFORMED IN ~~LOC~~ HOSPITAL LAB): SARS Coronavirus 2: NEGATIVE

## 2018-10-14 MED ORDER — OMEGA-3-ACID ETHYL ESTERS 1 G PO CAPS
1.0000 g | ORAL_CAPSULE | ORAL | Status: DC
  Administered 2018-10-14: 1 g via ORAL
  Filled 2018-10-14: qty 1

## 2018-10-14 MED ORDER — SODIUM CHLORIDE 0.9% FLUSH: 3.0000 mL | Freq: Once | INTRAVENOUS | Status: DC

## 2018-10-14 MED ORDER — DOCUSATE SODIUM 100 MG PO CAPS: 100.0000 mg | ORAL_CAPSULE | Freq: Once | ORAL | Status: DC

## 2018-10-14 MED ORDER — ONDANSETRON HCL 4 MG/2ML IJ SOLN: 4.0000 mg | Freq: Four times a day (QID) | INTRAMUSCULAR | Status: DC | PRN

## 2018-10-14 MED ORDER — LOSARTAN POTASSIUM 25 MG PO TABS: 12.5000 mg | ORAL_TABLET | ORAL | Status: DC

## 2018-10-14 MED ORDER — FUROSEMIDE 10 MG/ML IJ SOLN
20.0000 mg | Freq: Four times a day (QID) | INTRAMUSCULAR | Status: DC
  Administered 2018-10-14 – 2018-10-15 (×2): 20 mg via INTRAVENOUS
  Filled 2018-10-14 (×2): qty 2

## 2018-10-14 MED ORDER — SODIUM CHLORIDE 0.9% FLUSH
3.0000 mL | Freq: Two times a day (BID) | INTRAVENOUS | Status: DC
  Administered 2018-10-14 – 2018-10-17 (×6): 3 mL via INTRAVENOUS

## 2018-10-14 MED ORDER — CARVEDILOL 6.25 MG PO TABS: 6.2500 mg | ORAL_TABLET | Freq: Two times a day (BID) | ORAL | Status: DC

## 2018-10-14 MED ORDER — SODIUM CHLORIDE 0.9% FLUSH: 3.0000 mL | INTRAVENOUS | Status: DC | PRN

## 2018-10-14 MED ORDER — ENOXAPARIN SODIUM 40 MG/0.4ML ~~LOC~~ SOLN
40.0000 mg | SUBCUTANEOUS | Status: DC
  Administered 2018-10-14 – 2018-10-18 (×5): 40 mg via SUBCUTANEOUS
  Filled 2018-10-14 (×5): qty 0.4

## 2018-10-14 MED ORDER — GUAIFENESIN ER 600 MG PO TB12
600.0000 mg | ORAL_TABLET | Freq: Every day | ORAL | Status: DC
  Administered 2018-10-15 – 2018-10-19 (×5): 600 mg via ORAL
  Filled 2018-10-14 (×5): qty 1

## 2018-10-14 MED ORDER — DEXTROMETHORPHAN POLISTIREX ER 30 MG/5ML PO SUER
15.0000 mg | Freq: Every evening | ORAL | Status: DC | PRN
  Administered 2018-10-15 – 2018-10-18 (×5): 15 mg via ORAL
  Filled 2018-10-14 (×6): qty 5

## 2018-10-14 MED ORDER — FUROSEMIDE 10 MG/ML IJ SOLN
40.0000 mg | Freq: Once | INTRAMUSCULAR | Status: AC
  Administered 2018-10-14: 40 mg via INTRAVENOUS
  Filled 2018-10-14: qty 4

## 2018-10-14 MED ORDER — TRAMADOL HCL 50 MG PO TABS
50.0000 mg | ORAL_TABLET | Freq: Once | ORAL | Status: DC
  Filled 2018-10-14: qty 1

## 2018-10-14 MED ORDER — SODIUM CHLORIDE 0.9 % IV SOLN: 250.0000 mL | INTRAVENOUS | Status: DC | PRN

## 2018-10-14 MED ORDER — SPIRONOLACTONE 12.5 MG HALF TABLET
12.5000 mg | ORAL_TABLET | ORAL | Status: DC
  Filled 2018-10-14: qty 1

## 2018-10-14 MED ORDER — ALBUTEROL SULFATE (2.5 MG/3ML) 0.083% IN NEBU: 3.0000 mL | INHALATION_SOLUTION | Freq: Four times a day (QID) | RESPIRATORY_TRACT | Status: DC | PRN

## 2018-10-14 MED ORDER — ACETAMINOPHEN 325 MG PO TABS
650.0000 mg | ORAL_TABLET | Freq: Once | ORAL | Status: AC
  Administered 2018-10-14: 650 mg via ORAL
  Filled 2018-10-14: qty 2

## 2018-10-14 MED ORDER — ACETAMINOPHEN 325 MG PO TABS
650.0000 mg | ORAL_TABLET | ORAL | Status: DC | PRN
  Administered 2018-10-14 – 2018-10-16 (×3): 650 mg via ORAL
  Filled 2018-10-14 (×3): qty 2

## 2018-10-14 MED ORDER — EZETIMIBE 10 MG PO TABS
10.0000 mg | ORAL_TABLET | Freq: Every day | ORAL | Status: DC
  Administered 2018-10-14 – 2018-10-18 (×5): 10 mg via ORAL
  Filled 2018-10-14 (×5): qty 1

## 2018-10-14 MED ORDER — ASPIRIN EC 81 MG PO TBEC
81.0000 mg | DELAYED_RELEASE_TABLET | Freq: Every day | ORAL | Status: DC
  Administered 2018-10-14 – 2018-10-18 (×4): 81 mg via ORAL
  Filled 2018-10-14 (×5): qty 1

## 2018-10-14 NOTE — ED Provider Notes (Signed)
[facility] EMERGENCY DEPARTMENT Provider Note   CSN: [id number] Arrival date & time: 10/14/18  1027    History   Chief Complaint Chief Complaint  Patient presents with  . Chest Pain  . Shortness of Breath    HPI Ashley [name] is a 73 y.o. female.     HPI 73 year old female with history of CHF, diverticulitis, fibromyalgia and anxiety comes into the ER with chief complaint of chest pain and shortness of breath.  Patient reports that she has been having gradually worsening of her shortness of breath.  She saw her PCP last week because of shortness of breath and wheezing.  Her PCP put her on Levaquin and prednisone, with suspected CAP/bronchitis.  She has been taking her medicines as prescribed but her symptoms are getting worse.    Patient states that she feels like her symptoms are similar to "fluid buildup around her lungs". She reports that she was diagnosed with severe CHF years back but over time her EF returned to baseline normal.  Her symptoms currently are similar to her CHF and that any exertion gets her short of breath and even simple tasks like speaking or moving gets her short of breath.  She has associated midsternal chest pain that is intermittently present.  She denies any new cough.  Past Medical History:  Diagnosis Date  . Abdominal aortic atherosclerosis ([place]) 11/2015   by CT  . Allergy   . Anemia   . Anxiety   . Automatic implantable cardioverter-defibrillator in situ 2012   a. MDT single lead ICD.  Marland Kitchen Bronchiectasis    a. possibly mild, treat URIs aggressively  . Cancer (HCC)    basal cell, arms & neck  . Chronic sinusitis   . Depression   . Diarrhea 07/14/2008   Qualifier: Diagnosis of  By: Glori Bickers MD, Carmell Austria   . Disorder of vocal cords   . Essential hypertension   . Fibrocystic breast disease   . Fibromyalgia   . GERD (gastroesophageal reflux disease)   . H/O multiple allergies 10/10/2013  . Hemorrhoids   . History of  diverticulitis of colon   . History of pneumonia 2012  . Insomnia   . Irritable bowel syndrome   . Laryngeal nodule    Dr. Thomasena Edis  . Lung disease 07/14/11  . Migraine without aura, without mention of intractable migraine without mention of status migrainosus   . Mitral regurgitation   . Nonischemic cardiomyopathy ([place])    a. Recovered LV fxn - prev as low as 25%, now 50-55% by echo 04/2014;  b. 2012 s/p MDT [id number] [place] VR single lead ICD.  Marland Kitchen Osteoarthritis    knees, back, hands, low back,   . Osteoporosis 01/2013   T-2.7 spine (01/2016)  . Pneumonia 2011  . PONV (postoperative nausea and vomiting)   . Pre-syncope   . sigmoid diverticulitis with perforation, abscess and fistula 09/30/2013  . Sleep apnea 2010   borderline, inconclusive- /w [place]     Patient Active Problem List   Diagnosis Date Noted  . Bilateral leg numbness 06/01/2018  . Bursitis of right hip 06/01/2018  . Right ear pain 02/16/2018  . Back pain 02/14/2018  . UTI (urinary tract infection) 02/14/2018  . Mixed hyperlipidemia 01/17/2018  . Cervical lymphadenopathy 01/05/2018  . Plantar fasciitis, bilateral 12/13/2017  . Acquired renal cyst of right kidney 08/11/2017  . Unsteadiness on feet 07/24/2017  . Multiple rib fractures 06/19/2017  . 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  High total serum IgM 11/22/2016  . 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 Bleeding external hemorrhoids 10/31/2016  . LBBB (left bundle branch block) 07/11/2016  . Gastritis 06/12/2016  . Bladder wall thickening 02/16/2016  . Fecal incontinence   . Chronic generalized abdominal pain 11/22/2015  . Abdominal aortic atherosclerosis ([place]) 11/13/2015  . 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  . Hoarseness 06/21/2015  . Hypersensitivity to pneumococcal vaccine 06/10/2015  . Medicare annual wellness visit, subsequent 06/08/2015  . Advanced care planning/counseling discussion 06/08/2015  . Intertrigo 04/06/2015  . Chronic daily headache 04/06/2015  .  Peripheral neuropathy 04/06/2015  . Nonischemic cardiomyopathy ([place])   . Chest pain and shortness of breath 12/25/2014  . S/P colostomy takedown 02/10/2014  . ICD (implantable cardioverter-defibrillator) in place 10/23/2013  . H/O multiple allergies 10/10/2013  . History of diverticular abscess 09/25/2013  . Dyspnea 08/21/2013  . Osteoporosis 01/13/2013  . Irregular heart beat 08/06/2012  . MDD (major depressive disorder), recurrent episode, moderate ([place]) 07/09/2012  . Chronic recurrent sinusitis 06/17/2012  . Vitamin D deficiency 03/05/2012  . Essential tremor 12/29/2011  . GERD (gastroesophageal reflux disease) 11/13/2011  . Allergic rhinitis 11/13/2011  . CKD (chronic kidney disease) stage 3, GFR 30-59 ml/min (HCC) 11/09/2011  . Epistaxis, recurrent 10/11/2011  . Orthostatic hypotension 04/30/2011  . Cardiomyopathy, dilated, nonischemic ([place]) 03/01/2011  . Mitral regurgitation 02/24/2011  . Chronic systolic CHF (congestive heart failure) ([place]) 02/24/2011  . Chronic bronchitis ([place]) 01/20/2011  . Syncope 11/02/2010  . Chronic fatigue and malaise 11/02/2010  . Anxiety state 08/18/2010  . 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  . Dyslipidemia 11/22/2006  . COMMON MIGRAINE 11/22/2006  . Irritable bowel syndrome 11/22/2006  . FIBROCYSTIC BREAST DISEASE 11/22/2006  . Osteoarthritis 11/22/2006  . Fibromyalgia 11/22/2006    Past Surgical History:  Procedure Laterality Date  . ABDOMINAL HYSTERECTOMY  1987   w/BSO  . ANAL RECTAL MANOMETRY N/A 02/02/2016   Procedure: ANO RECTAL MANOMETRY;  Surgeon: Mauri Pole, MD;  Location: [facility];  Service: Endoscopy;  Laterality: N/A;  . APPENDECTOMY    . AUGMENTATION MAMMAPLASTY    . breast cystectomies     due to FCBD  . BREAST MASS EXCISION  01/2010   Left-fibrocystic change w/intraductal papilloma, no malignancy  . carotid US  89/3810   1-75% LICA, normal RICA  . [employer] Bunionectomy  11/04/08   Right Great Toe (Dr.  Beola Cord)  . COLONOSCOPY  01/2018   1 small benign polyp, diverticulosis, healthy anastomosis [name] @ [facility])  . COLOSTOMY N/A 10/02/2013   DESCENDING COLOSTOMY;  Surgeon: Imogene Burn. Tsuei, [place] N/A 02/10/2014   [name], MD  . EMG/MCV  10/16/01   + mild carpal tunnel  . ESOPHAGOGASTRODUODENOSCOPY  09/2014   small HH, irregular Z line, fundic gland polyps with mild gastritis ([place])  . Flex laryngoscopy  06/11/06   ([place]) nml  . IMPLANTABLE CARDIOVERTER DEFIBRILLATOR IMPLANT N/A 07/14/2011   MDT single chamber ICD  . KNEE ARTHROSCOPY W/ ORIF     Left; "meniscus tear"  . LASIK     left eye  . MANDIBLE SURGERY     "got about 6 pins in the bottom of my jaw"  . NCS  11/2014   L ulnar neuropathy, mild B median nerve entrapment ([place])  . OSTEOTOMY     Left foot  . PARTIAL COLECTOMY N/A 10/02/2013   PARTIAL COLECTOMY;  Surgeon: Imogene Burn. Tsuei, MD  . sinus surgery     x3  with balloon   . TOE AMPUTATION     "toe beside baby toe on left foot; got gangrene from corn"  . TONSILLECTOMY AND ADENOIDECTOMY       OB History   No obstetric history on file.      Home Medications    Prior to Admission medications   Medication Sig Start Date End Date Taking? Authorizing Provider  acetaminophen (TYLENOL) 500 MG tablet Take 500 mg by mouth daily as needed for moderate pain or headache (PAIN).    Yes [provider]  albuterol (PROVENTIL HFA;VENTOLIN HFA) 108 (90 Base) MCG/ACT inhaler TAKE 2 PUFFS BY MOUTH EVERY 6 HOURS AS NEEDED FOR WHEEZE OR SHORTNESS OF BREATH Patient taking differently: Inhale 2 puffs into the lungs every 6 (six) hours as needed for wheezing or shortness of breath.  01/10/18  Yes [name], MD  Ascorbic Acid (VITAMIN C) 1000 MG tablet Take 1,000 mg by mouth daily.   Yes [provider]  aspirin EC 81 MG tablet Take 81 mg by mouth at bedtime.    Yes [provider]  BIOTIN PO Take 1 tablet by mouth daily.   Yes  [provider]  carvedilol (COREG) 6.25 MG tablet TAKE 1 TABLET (6.25 MG TOTAL) BY MOUTH 2 (TWO) TIMES DAILY WITH A MEAL. 06/06/18  Yes Minna Merritts, MD  cholecalciferol (VITAMIN D) 1000 units tablet Take 2,000 Units by mouth daily.   Yes [provider]  Coenzyme Q10 (CO Q 10 PO) Take 1 tablet by mouth daily.   Yes [provider]  dextromethorphan (DELSYM) 30 MG/5ML liquid Take 15 mg by mouth at bedtime as needed for cough.   Yes [provider]  Diphenhyd-Hydrocort-Nystatin (FIRST-DUKES MOUTHWASH) SUSP Swish and swallow 68ml every 6 hours as needed 04/05/18  Yes Vivi Barrack, MD  ezetimibe (ZETIA) 10 MG tablet TAKE 1 TABLET (10 MG TOTAL) BY MOUTH DAILY. Patient taking differently: Take 10 mg by mouth at bedtime.  04/17/18  Yes [name], MD  GARLIC PO Take 1 tablet by mouth daily.   Yes [provider]  guaiFENesin (MUCINEX) 600 MG 12 hr tablet Take 600 mg by mouth daily.   Yes [provider]  levalbuterol (XOPENEX HFA) 45 MCG/ACT inhaler Inhale 2 puffs into the lungs every 4 (four) hours as needed for wheezing or shortness of breath. 09/20/18  Yes [name], MD  levofloxacin (LEVAQUIN) 500 MG tablet Take 1 tablet (500 mg total) by mouth daily. Patient taking differently: Take 500 mg by mouth at bedtime.  10/09/18  Yes [name], MD  losartan (COZAAR) 25 MG tablet Take 1 tablet (25 mg total) by mouth daily. Patient taking differently: Take 12.5 mg by mouth See admin instructions. Takes Monday, Wednesday, and Friday and Saturday 01/17/18  Yes Gollan, Kathlene November, MD  nystatin cream (MYCOSTATIN) Apply 1 application topically 2 (two) times daily. Patient taking differently: Apply 1 application topically as needed (yeast).  06/22/17  Yes [name], MD  omega-3 acid ethyl esters (LOVAZA) 1 g capsule Take 1 g by mouth once a week. Monday   Yes [provider]  potassium chloride (K-DUR) 10 MEQ tablet Take 10  mEq by mouth once a week. Takes every 7-10 days 01/04/18  Yes [name], MD  predniSONE (DELTASONE) 20 MG tablet Take two tablets daily for 3 days followed by one tablet daily for 4 days Patient taking differently: Take 20 mg by mouth at bedtime. Take two tablets daily for 3  days followed by one tablet daily for 4 days 10/09/18  Yes [name], MD  spironolactone (ALDACTONE) 25 MG tablet Take 1 tablet (25 mg total) by mouth every other day. Patient taking differently: Take 12.5 mg by mouth every other day. Monday, Wednesday, Friday and Saturday 01/17/18  Yes Gollan, Kathlene November, MD  torsemide (DEMADEX) 10 MG tablet TAKE 1 TABLET (10 MG TOTAL) BY MOUTH DAILY AS NEEDED (SWELLING). Patient taking differently: Take 10 mg by mouth once a week.  07/12/18  Yes [name], MD  vitamin B-12 (CYANOCOBALAMIN) 1000 MCG tablet Take 1 tablet (1,000 mcg total) by mouth daily. 05/31/18  Yes [name], MD  vitamin E 100 UNIT capsule Take 100 Units by mouth daily.   Yes [provider]  AMBULATORY NON FORMULARY MEDICATION Medication Name:  1 squatty potty to use as needed 01/20/16   Mauri Pole, MD  omeprazole (PRILOSEC) 20 MG capsule TAKE 1 CAPSULE BY MOUTH EVERY DAY Patient not taking: Reported on 10/14/2018 01/09/18   [name], MD  Red Yeast Rice 600 MG CAPS Take 1 capsule (600 mg total) by mouth 2 (two) times daily. Patient not taking: Reported on 10/14/2018 06/12/16   [name], MD  bisoprolol (ZEBETA) 5 MG tablet Take 5 mg by mouth daily. 06/06/11 07/02/18  Minna Merritts, MD    Family History Family History  Problem Relation Age of Onset  . Lung cancer Father        + smoker  . Colon polyps Father   . Dementia Mother   . Osteoporosis Mother        Lumbar spine  . Stroke Mother        x 5 @ 32 YOA  . Arthritis Mother        hands  . Emphysema Mother   . Alcohol abuse Paternal Grandfather   . Stroke Paternal Grandfather        ?  Marland Kitchen Heart disease  Paternal Grandfather   . Hypertension Maternal Grandfather        ?  Marland Kitchen Heart disease Paternal Grandmother   . Colon cancer Neg Hx   . Esophageal cancer Neg Hx   . Gallbladder disease Neg Hx     Social History Social History   Tobacco Use  . Smoking status: Never Smoker  . Smokeless tobacco: Never Used  . Tobacco comment: Lived with smokers x 23 yrs, smoked herself "for a week"  Substance Use Topics  . Alcohol use: No    Alcohol/week: 0.0 standard drinks  . Drug use: No     Allergies   Antihistamines, chlorpheniramine-type; Codeine; Cyclobenzaprine; Cymbalta [duloxetine hcl]; Influenza vac split [flu virus vaccine]; Prevnar [pneumococcal 13-val conj vacc]; Saline; Sulfasalazine; Atorvastatin; Chlorhexidine; Dilaudid [hydromorphone hcl]; Latex; Morphine and related; Pravastatin; Prevacid [lansoprazole]; Red dye; Rosuvastatin; Simvastatin; Diflucan [fluconazole]; Onion; Other; Penicillins; Sertraline; Tape; and Metaxalone   Review of Systems Review of Systems  Constitutional: Positive for activity change.  Respiratory: Positive for shortness of breath and wheezing.   Gastrointestinal: Positive for abdominal pain. Negative for nausea and vomiting.  Genitourinary: Negative for dysuria.  Hematological: Does not bruise/bleed easily.  All other systems reviewed and are negative.    Physical Exam Updated Vital Signs BP 123/68   Pulse 91   Temp 98.1 F (36.7 C) (Oral)   Resp (!) 22   SpO2 97%   Physical Exam Vitals signs and nursing note reviewed.  Constitutional:      Appearance: She is well-developed.  HENT:  Head: Normocephalic and atraumatic.  Eyes:     Pupils: Pupils are equal, round, and reactive to light.  Neck:     Musculoskeletal: Neck supple.  Cardiovascular:     Rate and Rhythm: Normal rate and regular rhythm.     Heart sounds: Normal heart sounds. No murmur.  Pulmonary:     Effort: Pulmonary effort is normal. No respiratory distress.     Breath  sounds: No stridor. Examination of the right-lower field reveals rales. Examination of the left-lower field reveals rales. Rales present.  Abdominal:     General: There is no distension.     Palpations: Abdomen is soft.     Tenderness: There is no abdominal tenderness. There is no guarding or rebound.  Musculoskeletal:     Right lower leg: No edema.     Left lower leg: No edema.  Skin:    General: Skin is warm and dry.  Neurological:     Mental Status: She is alert and oriented to person, place, and time.      ED Treatments / Results  Labs (all labs ordered are listed, but only abnormal results are displayed) Labs Reviewed  BASIC METABOLIC PANEL - Abnormal; Notable for the following components:      Result Value   Glucose, Bld 100 (*)    Creatinine, Ser 1.20 (*)    GFR calc non Af Amer 45 (*)    GFR calc Af Amer 52 (*)    All other components within normal limits  CBC - Abnormal; Notable for the following components:   WBC 16.4 (*)    All other components within normal limits  BRAIN NATRIURETIC PEPTIDE - Abnormal; Notable for the following components:   B Natriuretic Peptide 774.4 (*)    All other components within normal limits  SARS CORONAVIRUS 2 (HOSPITAL ORDER, [facility])  TROPONIN I    EKG EKG Interpretation  Date/Time:  Monday October 14 2018 10:34:23 EDT Ventricular Rate:  93 PR Interval:  154 QRS Duration: 150 QT Interval:  408 QTC Calculation: 507 R Axis:     Text Interpretation:  Normal sinus rhythm Possible Left atrial enlargement Left bundle branch block Abnormal ECG no change from previous Confirmed by Charlesetta Shanks [phone]) on 10/14/2018 10:42:15 AM   Radiology Dg Chest 2 View  Result Date: 10/14/2018 CLINICAL DATA:  Worsening chest pain and shortness of breath. EXAM: CHEST - 2 VIEW COMPARISON:  Chest x-ray dated December 29, 2017. FINDINGS: Unchanged left chest wall pacemaker. Stable borderline cardiomegaly. Normal mediastinal  contours. Pulmonary vascular congestion with mild interstitial thickening. No focal consolidation, pleural effusion, or pneumothorax. No acute osseous abnormality. IMPRESSION: 1. Mild interstitial pulmonary edema. Electronically Signed   By: Titus Dubin M.D.   On: 10/14/2018 11:07    Procedures Procedures (including critical care time)  Medications Ordered in ED Medications  sodium chloride flush (NS) 0.9 % injection 3 mL (has no administration in time range)  docusate sodium (COLACE) capsule 100 mg (has no administration in time range)  furosemide (LASIX) injection 40 mg (40 mg Intravenous Given 10/14/18 1512)  acetaminophen (TYLENOL) tablet 650 mg (650 mg Oral Given 10/14/18 1512)     Initial Impression / Assessment and Plan / ED Course  I have reviewed the triage vital signs and the nursing notes.  Pertinent labs & imaging results that were available during my care of the patient were reviewed by me and considered in my medical decision making (see chart for details).  73 year old female comes in with chief complaint of shortness of breath.  She has had a gradual progression of shortness of breath over the past few days.  She also has nonspecific chest discomfort now.  She has known history of CHF in the past, which appears to have improved gradually.  She also has had chronic bronchitis, but there is no known history of interstitial lung disease or CAD.  On my exam there is some wheezing at the base.  Patient is noted to be slightly tachypneic and anxious.  She states that her symptoms are similar to her prior CHF.  PE considered in the differential, however she does not have any risk factors for it and we have no findings concerning for DVT.  At this time PE is lower in the differential diagnosis and CHF, especially since patient states that she feels like she has CHF.  Chest x-ray ordered and it rules out pneumonia, pleural effusions.  X-ray is showing some interstitial edema.   We will give her some IV Lasix now.  She will be admitted to the hospital for optimization.   ROOPA GRAVER was evaluated in Emergency Department on 10/14/2018 for the symptoms described in the history of present illness. She was evaluated in the context of the global COVID-19 pandemic, which necessitated consideration that the patient might be at risk for infection with the SARS-CoV-2 virus that causes COVID-19. Institutional protocols and algorithms that pertain to the evaluation of patients at risk for COVID-19 are in a state of rapid change based on information released by regulatory bodies including the CDC and federal and state organizations. These policies and algorithms were followed during the patient's care in the ED.   Final Clinical Impressions(s) / ED Diagnoses   Final diagnoses:  Acute congestive heart failure, unspecified heart failure type [facility])    ED Discharge Orders    None       Varney Biles, MD 10/14/18 1515

## 2018-10-14 NOTE — ED Notes (Signed)
ED TO INPATIENT HANDOFF REPORT  ED Nurse Name and Phone #: [phone] Magda Kiel Name/Age/Gender Ashley [name] 73 y.o. female Room/Bed: 039C/039C  Code Status   Code Status: Prior  Home/SNF/Other Home Patient oriented to: self, place, time and situation Is this baseline? Yes   Triage Complete: Triage complete  Chief Complaint sob,chest pain  Triage Note Pt in c/o continued shortness of breath and chest pain, seen by PCP a few days ago and was told she had bronchitis, concerned it has progressed to pneumonia or worsened congestion HF, speaking in full sentences, no distress noted   Allergies Allergies  Allergen Reactions  . Antihistamines, Chlorpheniramine-Type Other (See Comments)    Alters vision  . Codeine Other (See Comments)    Confusion and dizziness  . Cyclobenzaprine Other (See Comments)    Adverse reaction - back and throat pain, dizziness, exhaustion  . Cymbalta [Duloxetine Hcl] Other (See Comments)    "body spasms and made me feel weird in my head"   . Influenza Vac Split [Flu Virus Vaccine] Other (See Comments)    "allergic to concentrated eggs that are in vaccine"  . Prevnar [Pneumococcal 13-Val Conj Vacc] Other (See Comments)    Egg allergy Bad reaction after shot  . Saline Rash    FEVER, BURNING  . Sulfasalazine Other (See Comments)    "makes my whole body smell like sulfa & makes me sick just smelling it"  . Atorvastatin Other (See Comments)    Severe muscle cramps to generic atorvastatin.  Able to take brand lipitor.  . Chlorhexidine Other (See Comments)    blisters  . Dilaudid [Hydromorphone Hcl] Other (See Comments)    Feels like she is burning internally   . Latex Nausea Only, Rash and Other (See Comments)    Pain and infection  . Morphine And Related Other (See Comments)    Internally feels like she is on fire   . Pravastatin Other (See Comments)    myalgias  . Prevacid [Lansoprazole] Other (See Comments)    Worsened GI side effects   . Red Dye Nausea Only and Swelling  . Rosuvastatin Other (See Comments)    Was not effective controlling lipids  . Simvastatin Other (See Comments)    Leg cramps  . Diflucan [Fluconazole] Nausea And Vomiting  . Onion     Diarrhea, GI distress, flared IBS  . Other     NOTE: pt is able to take cephalosporins without reaction  . Penicillins Other (See Comments)    CHILDHOOD ALLEGY/REACTION: "told 50 years ago I couldn't take it; might be immune to it", able to take amoxicillin Has patient had a PCN reaction causing immediate rash, facial/tongue/throat swelling, SOB or lightheadedness with hypotension: Unknown Has patient had a PCN reaction causing severe rash involving mucus membranes or skin necrosis: UnknowN Has patient had a PCN reaction that required hospitalization: /Unknown Has patient had a PCN reaction occurring within the last 10 years: Unknown If a  . Sertraline Other (See Comments)    Overly sedating  . Tape Other (See Comments)    All adhesives  - Blisters, itching and burning   . Metaxalone Other (See Comments)    REACTION: unknown    Level of Care/Admitting Diagnosis ED Disposition    ED Disposition Condition [facility]: [place] [[id number]]  Level of Care: Telemetry Cardiac [103]  I expect the patient will be discharged within 24 hours: No (not a candidate for 5C-Observation  unit)  Covid Evaluation: Confirmed COVID Negative  Diagnosis: Acute on chronic combined systolic and diastolic CHF (congestive heart failure) ([place]) [387564]  Admitting Physician: Karmen Bongo [[id number]]  Attending Physician: Karmen Bongo [[id number]]  PT Class (Do Not Modify): Observation [104]  PT Acc Code (Do Not Modify): Observation [10022]       B Medical/Surgery History Past Medical History:  Diagnosis Date  . Abdominal aortic atherosclerosis ([place]) 11/2015   by CT  . Allergy   . Anemia   . Anxiety   . Automatic implantable  cardioverter-defibrillator in situ 2012   a. MDT single lead ICD.  Marland Kitchen Bronchiectasis    a. possibly mild, treat URIs aggressively  . Cancer (HCC)    basal cell, arms & neck  . Chronic sinusitis   . Depression   . Essential hypertension   . Fibrocystic breast disease   . Fibromyalgia   . GERD (gastroesophageal reflux disease)   . H/O multiple allergies 10/10/2013  . Hemorrhoids   . History of diverticulitis of colon   . History of pneumonia 2012  . Insomnia   . Irritable bowel syndrome   . Laryngeal nodule    Dr. Thomasena Edis  . Migraine without aura, without mention of intractable migraine without mention of status migrainosus   . Mitral regurgitation   . Nonischemic cardiomyopathy ([place])    a. Recovered LV fxn - prev as low as 25%, now 50-55% by echo 04/2014;  b. 2012 s/p MDT [id number] [place] VR single lead ICD.  Marland Kitchen Osteoarthritis    knees, back, hands, low back,   . Osteoporosis 01/2013   T-2.7 spine (01/2016)  . PONV (postoperative nausea and vomiting)   . sigmoid diverticulitis with perforation, abscess and fistula 09/30/2013  . Sleep apnea 2010   borderline, inconclusive- /w [place]    Past Surgical History:  Procedure Laterality Date  . ABDOMINAL HYSTERECTOMY  1987   w/BSO  . ANAL RECTAL MANOMETRY N/A 02/02/2016   Procedure: ANO RECTAL MANOMETRY;  Surgeon: Mauri Pole, MD;  Location: [facility];  Service: Endoscopy;  Laterality: N/A;  . APPENDECTOMY    . AUGMENTATION MAMMAPLASTY    . breast cystectomies     due to FCBD  . BREAST MASS EXCISION  01/2010   Left-fibrocystic change w/intraductal papilloma, no malignancy  . carotid US  33/2951   8-84% LICA, normal RICA  . [employer] Bunionectomy  11/04/08   Right Great Toe (Dr. Beola Cord)  . COLONOSCOPY  01/2018   1 small benign polyp, diverticulosis, healthy anastomosis [name] @ [facility])  . COLOSTOMY N/A 10/02/2013   DESCENDING COLOSTOMY;  Surgeon: Imogene Burn. Tsuei, [place] N/A 02/10/2014   [name], MD   . EMG/MCV  10/16/01   + mild carpal tunnel  . ESOPHAGOGASTRODUODENOSCOPY  09/2014   small HH, irregular Z line, fundic gland polyps with mild gastritis ([place])  . Flex laryngoscopy  06/11/06   ([place]) nml  . IMPLANTABLE CARDIOVERTER DEFIBRILLATOR IMPLANT N/A 07/14/2011   MDT single chamber ICD  . KNEE ARTHROSCOPY W/ ORIF     Left; "meniscus tear"  . LASIK     left eye  . MANDIBLE SURGERY     "got about 6 pins in the bottom of my jaw"  . NCS  11/2014   L ulnar neuropathy, mild B median nerve entrapment ([place])  . OSTEOTOMY     Left foot  . PARTIAL COLECTOMY N/A 10/02/2013   PARTIAL COLECTOMY;  Surgeon: Imogene Burn. Tsuei, MD  .  sinus surgery     x3 with balloon   . TOE AMPUTATION     "toe beside baby toe on left foot; got gangrene from corn"  . TONSILLECTOMY AND ADENOIDECTOMY       A IV Location/Drains/Wounds Patient Lines/Drains/Airways Status   Active Line/Drains/Airways    Name:   Placement date:   Placement time:   Site:   Days:   Peripheral IV 10/14/18 Right Antecubital   10/14/18    1216    Antecubital   less than 1          Intake/Output Last 24 hours No intake or output data in the 24 hours ending 10/14/18 1628  Labs/Imaging Results for orders placed or performed during the hospital encounter of 10/14/18 (from the past 48 hour(s))  Basic metabolic panel     Status: Abnormal   Collection Time: 10/14/18 12:14 PM  Result Value Ref Range   Sodium 139 135 - 145 mmol/L   Potassium 3.8 3.5 - 5.1 mmol/L   Chloride 103 98 - 111 mmol/L   CO2 23 22 - 32 mmol/L   Glucose, Bld 100 (H) 70 - 99 mg/dL   BUN 19 8 - 23 mg/dL   Creatinine, Ser 1.20 (H) 0.44 - 1.00 mg/dL   Calcium 10.0 8.9 - 10.3 mg/dL   GFR calc non Af Amer 45 (L) >60 mL/min   GFR calc Af Amer 52 (L) >60 mL/min   Anion gap 13 5 - 15    Comment: Performed at [facility], [place] [address] [place], [place] [address]  CBC     Status: Abnormal   Collection Time: 10/14/18 12:14 PM  Result Value Ref Range    WBC 16.4 (H) 4.0 - 10.5 K/uL   RBC 4.22 3.87 - 5.11 MIL/uL   Hemoglobin 12.6 12.0 - 15.0 g/dL   HCT 39.5 36.0 - 46.0 %   MCV 93.6 80.0 - 100.0 fL   MCH 29.9 26.0 - 34.0 pg   MCHC 31.9 30.0 - 36.0 g/dL   RDW 12.7 11.5 - 15.5 %   Platelets 182 150 - 400 K/uL   nRBC 0.0 0.0 - 0.2 %    Comment: Performed at [facility], [place] [address], [place], [place] [address]  Troponin I - ONCE - STAT     Status: None   Collection Time: 10/14/18 12:14 PM  Result Value Ref Range   Troponin I <0.03 <0.03 ng/mL    Comment: Performed at [facility], [place] [address], [place], [place] [address]  Brain natriuretic peptide     Status: Abnormal   Collection Time: 10/14/18 12:14 PM  Result Value Ref Range   B Natriuretic Peptide 774.4 (H) 0.0 - 100.0 pg/mL    Comment: Performed at [facility] [address] [place], [place] [address]  SARS Coronavirus 2 (CEPHEID - Performed in [facility] hospital lab), Hosp Order     Status: None   Collection Time: 10/14/18 12:15 PM  Result Value Ref Range   SARS Coronavirus 2 NEGATIVE NEGATIVE    Comment: (NOTE) If result is NEGATIVE SARS-CoV-2 target nucleic acids are NOT DETECTED. The SARS-CoV-2 RNA is generally detectable in upper and lower  respiratory specimens during the acute phase of infection. The lowest  concentration of SARS-CoV-2 viral copies this assay can detect is 250  copies / mL. A negative result does not preclude SARS-CoV-2 infection  and should not be used as the sole basis for  treatment or other  patient management decisions.  A negative result may occur with  improper specimen collection / handling, submission of specimen other  than nasopharyngeal swab, presence of viral mutation(s) within the  areas targeted by this assay, and inadequate number of viral copies  (<250 copies / mL). A negative result must be combined with clinical  observations, patient history, and epidemiological information. If result is  POSITIVE SARS-CoV-2 target nucleic acids are DETECTED. The SARS-CoV-2 RNA is generally detectable in upper and lower  respiratory specimens dur ing the acute phase of infection.  Positive  results are indicative of active infection with SARS-CoV-2.  Clinical  correlation with patient history and other diagnostic information is  necessary to determine patient infection status.  Positive results do  not rule out bacterial infection or co-infection with other viruses. If result is PRESUMPTIVE POSTIVE SARS-CoV-2 nucleic acids MAY BE PRESENT.   A presumptive positive result was obtained on the submitted specimen  and confirmed on repeat testing.  While 2019 novel coronavirus  (SARS-CoV-2) nucleic acids may be present in the submitted sample  additional confirmatory testing may be necessary for epidemiological  and / or clinical management purposes  to differentiate between  SARS-CoV-2 and other Sarbecovirus currently known to infect humans.  If clinically indicated additional testing with an alternate test  methodology [phone]) is advised. The SARS-CoV-2 RNA is generally  detectable in upper and lower respiratory sp ecimens during the acute  phase of infection. The expected result is Negative. Fact Sheet for Patients:  StrictlyIdeas.no Fact Sheet for Healthcare Providers: BankingDealers.co.za This test is not yet approved or cleared by the [place] FDA and has been authorized for detection and/or diagnosis of SARS-CoV-2 by FDA under an Emergency Use Authorization (EUA).  This EUA will remain in effect (meaning this test can be used) for the duration of the COVID-19 declaration under Section 564(b)(1) of the Act, 21 U.S.C. section 360bbb-3(b)(1), unless the authorization is terminated or revoked sooner. Performed at [facility], [place] [address] [place], [place] [address]    *Note: Due to a large number of results and/or  encounters for the requested time period, some results have not been displayed. A complete set of results can be found in Results Review.   Dg Chest 2 View  Result Date: 10/14/2018 CLINICAL DATA:  Worsening chest pain and shortness of breath. EXAM: CHEST - 2 VIEW COMPARISON:  Chest x-ray dated December 29, 2017. FINDINGS: Unchanged left chest wall pacemaker. Stable borderline cardiomegaly. Normal mediastinal contours. Pulmonary vascular congestion with mild interstitial thickening. No focal consolidation, pleural effusion, or pneumothorax. No acute osseous abnormality. IMPRESSION: 1. Mild interstitial pulmonary edema. Electronically Signed   By: Titus Dubin M.D.   On: 10/14/2018 11:07    Pending Labs Unresulted Labs (From admission, onward)   None      Vitals/Pain Today's Vitals   10/14/18 1515 10/14/18 1530 10/14/18 1545 10/14/18 1615  BP: (!) 146/90 (!) 142/97 (!) 158/123 136/81  Pulse: (!) 122 (!) 114 (!) 128 (!) 104  Resp: (!) 24 (!) 22 19 (!) 21  Temp:      TempSrc:      SpO2: 96% 93% 95% 98%  PainSc:        Isolation Precautions No active isolations  Medications Medications  sodium chloride flush (NS) 0.9 % injection 3 mL (has no administration in time range)  docusate sodium (COLACE) capsule 100 mg (has no administration in time range)  furosemide (LASIX) injection 40 mg (  40 mg Intravenous Given 10/14/18 1512)  acetaminophen (TYLENOL) tablet 650 mg (650 mg Oral Given 10/14/18 1512)    Mobility walks Low fall risk   Focused Assessments Cardiac Assessment Handoff:    Lab Results  Component Value Date   CKTOTAL 83 05/28/2014   CKMB 1.3 09/14/2011   TROPONINI <0.03 10/14/2018   Lab Results  Component Value Date   DDIMER 1.33 (H) 12/29/2017   Does the Patient currently have chest pain? No     R Recommendations: See Admitting Provider Note  Report given to:   Additional Notes:

## 2018-10-14 NOTE — Progress Notes (Signed)
CRITICAL VALUE ALERT  Critical Value:  Troponin 0.03  Date & Time Notied:  10/14/2018 2105  Provider Notified: Baltazar Najjar  Orders Received/Actions taken: Awaiting orders

## 2018-10-14 NOTE — H&P (Signed)
History and Physical    Ashley [name]:[id number] DOB: Apr 12, 1946 DOA: 10/14/2018  PCP: [name], MD Consultants:  [facility] - cardiology; Lovena Le - EP; [name] - GI, Kristeen Mans - pulmonology Patient coming from:  Home - lives with husband; NOK: Husband, [phone]  Chief Complaint: CP and SOB  HPI: Ashley [name] is a 73 y.o. female with medical history significant of OSA; diverticulitis with perforation, abscess, and fistula (2015); NICM with AICD; COPD; and HTN presenting with CP and SOB.  A couple of weeks ago, she saw her doctor because all of her joints were hurting.  This was thought to be from fibromylagia and he put her on saline nasal spray and Gas X.  Her joints got better and then she has been having chronic GI issues with alternating severe constipation and diarrhea occurring about once every week or two.  2 weeks ago, she did a video chat with [facility] - extreme constipation requiring manual disimpaction.  She had a light prep and then having Miralax every other day.  She is seeing him in June.  She had diarrhea 2 nights ago and is having constipation again.  The SOB started from the COPD and pneumonia and bronchitis, diagnosed on 5/27, started on Prednisone and Levaquin x 1 week.  She has continued to have wheezing and SOB since starting them, unable to sleep at night.  She is unable to lie at her usual 45 degrees due to wheezing, chest pain.  Lots of pain in chest and gut with laying back.  Sitting up at bedside now.  Substernal chest burning.  Her chest pain feels like when she used to have CHF.  Her heart has felt like "there's a butterfly in it, flipping his wings... like the afib I had."  EF was 13% and she had an AICD placed in the past but EF has normalized.  "They said I never should have gone on oxygen because it was my heart and not my lungs" at the time of diagnosis.  ED Course:  ?recurrent CHF - h/o EF to 10% with AICD but last echo normalized.  Worsening  SOB, wheezing for a few days - given Levaquin and Prednisone by PCP but symptoms worse and similar to CHF.  No overt volume overload but CXR concerning for edema and BNP elevated.  Given Lasix 40 mg (on Demadex at home).  Review of Systems: As per HPI; otherwise review of systems reviewed and negative.   Ambulatory Status:  Ambulates without assistance  Past Medical History:  Diagnosis Date   Abdominal aortic atherosclerosis ([place]) 11/2015   by CT   Allergy    Anemia    Anxiety    Automatic implantable cardioverter-defibrillator in situ 2012   a. MDT single lead ICD.   Bronchiectasis    a. possibly mild, treat URIs aggressively   Cancer ([place])    basal cell, arms & neck   Chronic sinusitis    Depression    Essential hypertension    Fibrocystic breast disease    Fibromyalgia    GERD (gastroesophageal reflux disease)    H/O multiple allergies 10/10/2013   Hemorrhoids    History of diverticulitis of colon    History of pneumonia 2012   Insomnia    Irritable bowel syndrome    Laryngeal nodule    Dr. Thomasena Edis   Migraine without aura, without mention of intractable migraine without mention of status migrainosus    Mitral regurgitation    Nonischemic cardiomyopathy ([place])  a. Recovered LV fxn - prev as low as 25%, now 50-55% by echo 04/2014;  b. 2012 s/p MDT [id number] Secura VR single lead ICD.   Osteoarthritis    knees, back, hands, low back,    Osteoporosis 01/2013   T-2.7 spine (01/2016)   PONV (postoperative nausea and vomiting)    sigmoid diverticulitis with perforation, abscess and fistula 09/30/2013   Sleep apnea 2010   borderline, inconclusive- /w [place]     Past Surgical History:  Procedure Laterality Date   ABDOMINAL HYSTERECTOMY  1987   w/BSO   ANAL RECTAL MANOMETRY N/A 02/02/2016   Procedure: ANO RECTAL MANOMETRY;  Surgeon: Mauri Pole, MD;  Location: [facility];  Service: Endoscopy;  Laterality: N/A;   APPENDECTOMY      AUGMENTATION MAMMAPLASTY     breast cystectomies     due to [place]  01/2010   Left-fibrocystic change w/intraductal papilloma, no malignancy   carotid US  83/1517   6-16% LICA, normal RICA   Chevron Bunionectomy  11/04/08   Right Great Toe (Dr. Beola Cord)   COLONOSCOPY  01/2018   1 small benign polyp, diverticulosis, healthy anastomosis [name] @ [place])   COLOSTOMY N/A 10/02/2013   DESCENDING COLOSTOMY;  Surgeon: Imogene Burn. [place], MD   COLOSTOMY REVERSAL N/A 02/10/2014   [name], MD   EMG/MCV  10/16/01   + mild carpal tunnel   ESOPHAGOGASTRODUODENOSCOPY  09/2014   small HH, irregular Z line, fundic gland polyps with mild gastritis Olevia Perches)   Flex laryngoscopy  06/11/06   ([place]) nml   IMPLANTABLE CARDIOVERTER DEFIBRILLATOR IMPLANT N/A 07/14/2011   MDT single chamber ICD   KNEE ARTHROSCOPY W/ ORIF     Left; "meniscus tear"   LASIK     left eye   MANDIBLE SURGERY     "got about 6 pins in the bottom of my jaw"   NCS  11/2014   L ulnar neuropathy, mild B median nerve entrapment ([place])   OSTEOTOMY     Left foot   PARTIAL COLECTOMY N/A 10/02/2013   PARTIAL COLECTOMY;  Surgeon: Imogene Burn. Tsuei, MD   sinus surgery     x3 with balloon    TOE AMPUTATION     "toe beside baby toe on left foot; got gangrene from corn"   TONSILLECTOMY AND ADENOIDECTOMY      Social History   Socioeconomic History   Marital status: Married    Spouse name: Not on file   Number of children: 1   Years of education: Not on file   Highest education level: Not on file  Occupational History   Occupation: Oncologist   Occupation: Aeronautical engineer  Social Needs   Financial resource strain: Not on file   Food insecurity:    Worry: Not on file    Inability: Not on file   Transportation needs:    Medical: Not on file    Non-medical: Not on file  Tobacco Use   Smoking status: Never Smoker   Smokeless tobacco: Never  Used   Tobacco comment: Lived with smokers x 23 yrs, smoked herself "for a week"  Substance and Sexual Activity   Alcohol use: No    Alcohol/week: 0.0 standard drinks   Drug use: No   Sexual activity: Not Currently  Lifestyle   Physical activity:    Days per week: Not on file    Minutes per session: Not on file   Stress: Not  on file  Relationships   Social connections:    Talks on phone: Not on file    Gets together: Not on file    Attends religious service: Not on file    Active member of club or organization: Not on file    Attends meetings of clubs or organizations: Not on file    Relationship status: Not on file   Intimate partner violence:    Fear of current or ex partner: Not on file    Emotionally abused: Not on file    Physically abused: Not on file    Forced sexual activity: Not on file  Other Topics Concern   Not on file  Social History Narrative   Lives with husband Herbie [place]   1 adopted child    Allergies  Allergen Reactions   Antihistamines, Chlorpheniramine-Type Other (See Comments)    Alters vision   Codeine Other (See Comments)    Confusion and dizziness   Cyclobenzaprine Other (See Comments)    Adverse reaction - back and throat pain, dizziness, exhaustion   Cymbalta [Duloxetine Hcl] Other (See Comments)    "body spasms and made me feel weird in my head"    Influenza Vac Split [Flu Virus Vaccine] Other (See Comments)    "allergic to concentrated eggs that are in vaccine"   Prevnar [Pneumococcal 13-Val Conj Vacc] Other (See Comments)    Egg allergy Bad reaction after shot   Saline Rash    FEVER, BURNING   Sulfasalazine Other (See Comments)    "makes my whole body smell like sulfa & makes me sick just smelling it"   Atorvastatin Other (See Comments)    Severe muscle cramps to generic atorvastatin.  Able to take brand lipitor.   Chlorhexidine Other (See Comments)    blisters   Dilaudid [Hydromorphone Hcl] Other (See Comments)     Feels like she is burning internally    Latex Nausea Only, Rash and Other (See Comments)    Pain and infection   Morphine And Related Other (See Comments)    Internally feels like she is on fire    Pravastatin Other (See Comments)    myalgias   Prevacid [Lansoprazole] Other (See Comments)    Worsened GI side effects   Red Dye Nausea Only and Swelling   Rosuvastatin Other (See Comments)    Was not effective controlling lipids   Simvastatin Other (See Comments)    Leg cramps   Diflucan [Fluconazole] Nausea And Vomiting   Onion     Diarrhea, GI distress, flared IBS   Other     NOTE: pt is able to take cephalosporins without reaction   Penicillins Other (See Comments)    CHILDHOOD ALLEGY/REACTION: "told 50 years ago I couldn't take it; might be immune to it", able to take amoxicillin Has patient had a PCN reaction causing immediate rash, facial/tongue/throat swelling, SOB or lightheadedness with hypotension: Unknown Has patient had a PCN reaction causing severe rash involving mucus membranes or skin necrosis: UnknowN Has patient had a PCN reaction that required hospitalization: /Unknown Has patient had a PCN reaction occurring within the last 10 years: Unknown If a   Sertraline Other (See Comments)    Overly sedating   Tape Other (See Comments)    All adhesives  - Blisters, itching and burning    Metaxalone Other (See Comments)    REACTION: unknown    Family History  Problem Relation Age of Onset   Lung cancer Father        +  smoker   Colon polyps Father    Dementia Mother    Osteoporosis Mother        Lumbar spine   Stroke Mother        x 5 @ 25 YOA   Arthritis Mother        hands   Emphysema Mother    Alcohol abuse Paternal Grandfather    Stroke Paternal Grandfather        ?   Heart disease Paternal Grandfather    Hypertension Maternal Grandfather        ?   Heart disease Paternal Grandmother    Colon cancer Neg Hx    Esophageal cancer  Neg Hx    Gallbladder disease Neg Hx     Prior to Admission medications   Medication Sig Start Date End Date Taking? Authorizing Provider  acetaminophen (TYLENOL) 500 MG tablet Take 500 mg by mouth daily as needed for moderate pain or headache (PAIN).    Yes [provider]  albuterol (PROVENTIL HFA;VENTOLIN HFA) 108 (90 Base) MCG/ACT inhaler TAKE 2 PUFFS BY MOUTH EVERY 6 HOURS AS NEEDED FOR WHEEZE OR SHORTNESS OF BREATH Patient taking differently: Inhale 2 puffs into the lungs every 6 (six) hours as needed for wheezing or shortness of breath.  01/10/18  Yes [name], MD  Ascorbic Acid (VITAMIN C) 1000 MG tablet Take 1,000 mg by mouth daily.   Yes [provider]  aspirin EC 81 MG tablet Take 81 mg by mouth at bedtime.    Yes [provider]  BIOTIN PO Take 1 tablet by mouth daily.   Yes [provider]  carvedilol (COREG) 6.25 MG tablet TAKE 1 TABLET (6.25 MG TOTAL) BY MOUTH 2 (TWO) TIMES DAILY WITH A MEAL. 06/06/18  Yes Minna Merritts, MD  cholecalciferol (VITAMIN D) 1000 units tablet Take 2,000 Units by mouth daily.   Yes [provider]  Coenzyme Q10 (CO Q 10 PO) Take 1 tablet by mouth daily.   Yes [provider]  dextromethorphan (DELSYM) 30 MG/5ML liquid Take 15 mg by mouth at bedtime as needed for cough.   Yes [provider]  Diphenhyd-Hydrocort-Nystatin (FIRST-DUKES MOUTHWASH) SUSP Swish and swallow 42ml every 6 hours as needed 04/05/18  Yes Vivi Barrack, MD  ezetimibe (ZETIA) 10 MG tablet TAKE 1 TABLET (10 MG TOTAL) BY MOUTH DAILY. Patient taking differently: Take 10 mg by mouth at bedtime.  04/17/18  Yes [name], MD  GARLIC PO Take 1 tablet by mouth daily.   Yes [provider]  guaiFENesin (MUCINEX) 600 MG 12 hr tablet Take 600 mg by mouth daily.   Yes [provider]  levalbuterol (XOPENEX HFA) 45 MCG/ACT inhaler Inhale 2 puffs into the lungs every 4 (four) hours as needed for  wheezing or shortness of breath. 09/20/18  Yes [name], MD  levofloxacin (LEVAQUIN) 500 MG tablet Take 1 tablet (500 mg total) by mouth daily. Patient taking differently: Take 500 mg by mouth at bedtime.  10/09/18  Yes [name], MD  losartan (COZAAR) 25 MG tablet Take 1 tablet (25 mg total) by mouth daily. Patient taking differently: Take 12.5 mg by mouth See admin instructions. Takes Monday, Wednesday, and Friday and Saturday 01/17/18  Yes Gollan, Kathlene November, MD  nystatin cream (MYCOSTATIN) Apply 1 application topically 2 (two) times daily. Patient taking differently: Apply 1 application topically as needed (yeast).  06/22/17  Yes [name], MD  omega-3 acid ethyl esters (LOVAZA) 1 g capsule  Take 1 g by mouth once a week. Monday   Yes [provider]  potassium chloride (K-DUR) 10 MEQ tablet Take 10 mEq by mouth once a week. Takes every 7-10 days 01/04/18  Yes [name], MD  predniSONE (DELTASONE) 20 MG tablet Take two tablets daily for 3 days followed by one tablet daily for 4 days Patient taking differently: Take 20 mg by mouth at bedtime. Take two tablets daily for 3 days followed by one tablet daily for 4 days 10/09/18  Yes [name], MD  spironolactone (ALDACTONE) 25 MG tablet Take 1 tablet (25 mg total) by mouth every other day. Patient taking differently: Take 12.5 mg by mouth every other day. Monday, Wednesday, Friday and Saturday 01/17/18  Yes Gollan, Kathlene November, MD  torsemide (DEMADEX) 10 MG tablet TAKE 1 TABLET (10 MG TOTAL) BY MOUTH DAILY AS NEEDED (SWELLING). Patient taking differently: Take 10 mg by mouth once a week.  07/12/18  Yes [name], MD  vitamin B-12 (CYANOCOBALAMIN) 1000 MCG tablet Take 1 tablet (1,000 mcg total) by mouth daily. 05/31/18  Yes [name], MD  vitamin E 100 UNIT capsule Take 100 Units by mouth daily.   Yes [provider]  AMBULATORY NON FORMULARY MEDICATION Medication Name:  1 squatty potty to use  as needed 01/20/16   Mauri Pole, MD  omeprazole (PRILOSEC) 20 MG capsule TAKE 1 CAPSULE BY MOUTH EVERY DAY Patient not taking: Reported on 10/14/2018 01/09/18   [name], MD  Red Yeast Rice 600 MG CAPS Take 1 capsule (600 mg total) by mouth 2 (two) times daily. Patient not taking: Reported on 10/14/2018 06/12/16   [name], MD  bisoprolol (ZEBETA) 5 MG tablet Take 5 mg by mouth daily. 06/06/11 07/02/18  Minna Merritts, MD    Physical Exam: Vitals:   10/14/18 1645 10/14/18 1715 10/14/18 1730 10/14/18 1745  BP: (!) 141/87 131/84 119/88 127/85  Pulse: (!) 103 (!) 103 98 98  Resp: (!) 26 (!) 22 20 20   Temp:      TempSrc:      SpO2: 99% 99% 98% 98%      General:  Appears profoundly SOB, fatigued, dyspneic with conversation, and sitting up at bedside due to SOB  Eyes:  PERRL, EOMI, normal lids, iris  ENT:  grossly normal hearing, lips & tongue, mmm; appropriate dentition  Neck:  no LAD, masses or thyromegaly  Cardiovascular:  RR with tachycardia, no m/r/g. No LE edema.   Respiratory:   Bibasilar crackles, increased respiratory effort which is significantly worse with limited movement, conversation  Abdomen:  soft, NT, ND, NABS.  She has significant surgical scar and disfigurement but there is no acute exacerbation of this apparent.  Back:   normal alignment, no CVAT  Skin:  no rash or induration seen on limited exam  Musculoskeletal:  grossly normal tone BUE/BLE, good ROM, no bony abnormality  Psychiatric:  grossly normal mood and affect, speech fluent and appropriate, AOx3  Neurologic:  CN 2-12 grossly intact, moves all extremities in coordinated fashion    Radiological Exams on Admission: Dg Chest 2 View  Result Date: 10/14/2018 CLINICAL DATA:  Worsening chest pain and shortness of breath. EXAM: CHEST - 2 VIEW COMPARISON:  Chest x-ray dated December 29, 2017. FINDINGS: Unchanged left chest wall pacemaker. Stable borderline cardiomegaly. Normal  mediastinal contours. Pulmonary vascular congestion with mild interstitial thickening. No focal consolidation, pleural effusion, or pneumothorax. No acute osseous abnormality. IMPRESSION: 1. Mild interstitial pulmonary edema. Electronically Signed  By: Titus Dubin M.D.   On: 10/14/2018 11:07    EKG: Independently reviewed.  NSR vs. Aflutter? with rate 93; LBBB; nonspecific ST changes with no evidence of acute ischemia   Labs on Admission: I have personally reviewed the available labs and imaging studies at the time of the admission.  Pertinent labs:   BUN 19/Creatinine 1.20/GFR 45; 21/0.99/55 on 5/8 BNP 774.4; 18.0 in 2016 Troponin <0.03 WBC 16.4 COVID negative today and 5/27  Assessment/Plan Principal Problem:   Acute on chronic combined systolic and diastolic CHF (congestive heart failure) (HCC) Active Problems:   Dyslipidemia   CKD (chronic kidney disease) stage 3, GFR 30-59 ml/min (HCC)   ICD (implantable cardioverter-defibrillator) in place   Acute respiratory distress   Acute on chronic CHF -Patient with prior h/o severe CHF with improvement presenting with worsening SOB and hypoxia despite recent antibiotics -CXR consistent with pulmonary edema -Elevated BNP -Initial EKG suggested possible aflutter, but cardiology has evaluating and reports that this is sinus tachcyardia -With elevated BNP and abnl CXR, recurrent CHF seems probable as diagnosis -Will admit with telemetry -Will request echocardiogram - bedside Echo performed and shows recurrent CHF with EF 15-20% -Has AICD in place -Will continue ASA -Will continue Losartan -Will continue Coreg -CHF order set utilized -Was given Lasix 40 mg x 1 in ER and will repeat with 20 mg IV q6h for now -Continue [place] O2 for now -Normal kidney function at this time, will follow -Repeat EKG in AM -Will r/o with serial troponins although doubt ACS based on symptoms -Appreciate cardiology consultation  HTN -Continue Coreg,  Losartan  HLD -Continue Zetia, Lovaza -She has 25 listed drug allergies and appears to be unable to tolerate statin therapy; it is not clear if Repatha would be an option for her -Lipids were checked in 6/19 and LDL was 124, clearly not at goal  Stage 3 CKD -Appears to be generally stable at this time -Will follow with diuresis    Note: This patient has been tested and is negative for the novel coronavirus COVID-19.   DVT prophylaxis: Lovenox  Code Status:  Full - confirmed with patient Family Communication: None present; she did not request that I contact her husband at the time of our visit. Disposition Plan:  Home once clinically improved Consults called: Cardiology; CM/PT Admission status: Admit - It is my clinical opinion that admission to INPATIENT is reasonable and necessary because this patient will require at least 2 midnights in the hospital to treat this condition based on the medical complexity of the problems presented.  Given the aforementioned information, the predictability of an adverse outcome is felt to be significant.      Karmen Bongo MD [facility]   How to contact the [facility] Attending or Consulting provider [place] or covering provider during after hours [place], for this patient?  1. Check the care team in [facility] and look for a) attending/consulting [facility] provider listed and b) the [facility] team listed 2. Log into [email or web address] and use [place]'s universal password to access. If you do not have the password, please contact the hospital operator. 3. Locate the [facility] provider you are looking for under [facility] and page to a number that you can be directly reached. 4. If you still have difficulty reaching the provider, please page the [facility] (Director on Call) for the Hospitalists listed on amion for assistance.   10/14/2018, 6:06 PM

## 2018-10-14 NOTE — ED Triage Notes (Signed)
Pt in c/o continued shortness of breath and chest pain, seen by PCP a few days ago and was told she had bronchitis, concerned it has progressed to pneumonia or worsened congestion HF, speaking in full sentences, no distress noted

## 2018-10-14 NOTE — Progress Notes (Signed)
  Echocardiogram 2D Echocardiogram has been performed.  Ashley [name] 10/14/2018, 6:32 PM

## 2018-10-14 NOTE — ED Notes (Signed)
Pt sitting on side of bed, SOB with talking. States it hurts to lay down and her stomach hurts more when lying down. EDP made aware.

## 2018-10-14 NOTE — ED Notes (Signed)
Pt still sitting on side of bed states she is unable to lay down from pain. Pt states belly pain is better and now has chest pain when taking a breath. Pt on 2 L nasal cannula at 99%. Pt seems anxious

## 2018-10-14 NOTE — ED Notes (Signed)
Talked with pt. And she has communicated with her husband and daughter. Ask me to call her husband and tell him what's going on. Husband- Herbie [place] Mikki Santee) [phone] called and informed him of his wife's diagnosis and let him know she will be admitted and someone would call him later were she is admitted to. Information given to the pt. -call to her husband.

## 2018-10-14 NOTE — Progress Notes (Signed)
Ashley [name] [id number] Admission Data: 10/14/2018 8:11 PM Attending Provider: Karmen Bongo, MD  SHF:[id number], Garlon Hatchet, MD Consults/ Treatment Team:   Ashley [name] is a 73 y.o. female patient admitted from ED awake, alert  & orientated  X 3,  Full Code, VSS - Blood pressure 108/82, pulse 92, temperature 98 F (36.7 C), temperature source Oral, resp. rate (!) 28, SpO2 98 %., O2    2 L nasal cannular, no c/o shortness of breath, no c/o chest pain, no distress noted. Tele # [id number] placed and pt is currently running:normal sinus rhythm.   IV site WDL:  antecubital right, condition patent and no redness with a transparent dsg that's clean dry and intact.  Allergies:   Allergies  Allergen Reactions  . Antihistamines, Chlorpheniramine-Type Other (See Comments)    Alters vision  . Codeine Other (See Comments)    Confusion and dizziness  . Cyclobenzaprine Other (See Comments)    Adverse reaction - back and throat pain, dizziness, exhaustion  . Cymbalta [Duloxetine Hcl] Other (See Comments)    "body spasms and made me feel weird in my head"   . Influenza Vac Split [Flu Virus Vaccine] Other (See Comments)    "allergic to concentrated eggs that are in vaccine"  . Prevnar [Pneumococcal 13-Val Conj Vacc] Other (See Comments)    Egg allergy Bad reaction after shot  . Saline Rash    FEVER, BURNING  . Sulfasalazine Other (See Comments)    "makes my whole body smell like sulfa & makes me sick just smelling it"  . Atorvastatin Other (See Comments)    Severe muscle cramps to generic atorvastatin.  Able to take brand lipitor.  . Chlorhexidine Other (See Comments)    blisters  . Dilaudid [Hydromorphone Hcl] Other (See Comments)    Feels like she is burning internally   . Latex Nausea Only, Rash and Other (See Comments)    Pain and infection  . Morphine And Related Other (See Comments)    Internally feels like she is on fire   . Pravastatin Other (See Comments)    myalgias  . Prevacid  [Lansoprazole] Other (See Comments)    Worsened GI side effects  . Red Dye Nausea Only and Swelling  . Rosuvastatin Other (See Comments)    Was not effective controlling lipids  . Simvastatin Other (See Comments)    Leg cramps  . Diflucan [Fluconazole] Nausea And Vomiting  . Onion     Diarrhea, GI distress, flared IBS  . Other     NOTE: pt is able to take cephalosporins without reaction  . Penicillins Other (See Comments)    CHILDHOOD ALLEGY/REACTION: "told 50 years ago I couldn't take it; might be immune to it", able to take amoxicillin Has patient had a PCN reaction causing immediate rash, facial/tongue/throat swelling, SOB or lightheadedness with hypotension: Unknown Has patient had a PCN reaction causing severe rash involving mucus membranes or skin necrosis: UnknowN Has patient had a PCN reaction that required hospitalization: /Unknown Has patient had a PCN reaction occurring within the last 10 years: Unknown If a  . Sertraline Other (See Comments)    Overly sedating  . Tape Other (See Comments)    All adhesives  - Blisters, itching and burning   . Metaxalone Other (See Comments)    REACTION: unknown     Past Medical History:  Diagnosis Date  . Abdominal aortic atherosclerosis ([place]) 11/2015   by CT  . Allergy   . Anemia   .  Anxiety   . Automatic implantable cardioverter-defibrillator in situ 2012   a. MDT single lead ICD.  Marland Kitchen Bronchiectasis    a. possibly mild, treat URIs aggressively  . Cancer ([facility])    basal cell, arms & neck  . Chronic sinusitis   . Depression   . Essential hypertension   . Fibrocystic breast disease   . Fibromyalgia   . GERD (gastroesophageal reflux disease)   . H/O multiple allergies 10/10/2013  . Hemorrhoids   . History of diverticulitis of colon   . History of pneumonia 2012  . Insomnia   . Irritable bowel syndrome   . Laryngeal nodule    Dr. Thomasena Edis  . Migraine without aura, without mention of intractable migraine without mention of  status migrainosus   . Mitral regurgitation   . Nonischemic cardiomyopathy ([place])    a. Recovered LV fxn - prev as low as 25%, now 50-55% by echo 04/2014;  b. 2012 s/p MDT [id number] [place] VR single lead ICD.  Marland Kitchen Osteoarthritis    knees, back, hands, low back,   . Osteoporosis 01/2013   T-2.7 spine (01/2016)  . PONV (postoperative nausea and vomiting)   . sigmoid diverticulitis with perforation, abscess and fistula 09/30/2013  . Sleep apnea 2010   borderline, inconclusive- /w [place]     History:  obtained from chart review and the patient.   Pt orientation to unit, room and routine. Information packet given to patient/family and safety video watched.  Admission INP armband ID verified with patient/family, and in place. SR up x 2, fall risk assessment complete with Patient and family verbalizing understanding of risks associated with falls. Pt verbalizes an understanding of how to use the call bell and to call for help before getting out of bed.  Skin, clean-dry- intact without evidence of bruising, or skin tears.   No evidence of skin break down noted on exam. no rashes, no ecchymoses, no petechiae, no nodules, no jaundice, no purpura, no wounds. Patient has moisture associated damage in groin area and dry scaly patches on back.     Will cont to monitor and assist as needed.  Tresa Endo, RN 10/14/2018 8:11 PM

## 2018-10-14 NOTE — Telephone Encounter (Signed)
She states that her EF is lower again because she is sick. Reports symptoms are consistent with previous low EF and is very concerned. She did see her PCP last week and diagnosed bronchitis and prescribed antibiotics. Symptoms are shortness of breath, coughing, chest discomfort, crackling noise when breathing, and just feels very poorly. Blood pressure was 112/48 today and would like to be seen as soon as possible. Scheduled her to come into office for visit tomorrow with Dr. [name] at 3 pm. She was agreeable with this appointment. Reviewed that if her symptoms should worsen then she should go to ED for further evaluation.

## 2018-10-14 NOTE — Telephone Encounter (Signed)
Spoke with patient again to review she may want to arrive early due to our entrance being closed. Instructed her to enter at the [facility] Entrance and they will prescreen her there and then direct her to our office. She has been tested for COVID based on her symptoms and was negative. Again reviewed that if her symptoms should worsen to go directly to ED. She verbalized understanding of our conversation, agreement with plan, and had no further questions at this time.

## 2018-10-14 NOTE — Consult Note (Addendum)
Cardiology Consultation:   Patient ID: Ashley [name] MRN: [id number]; DOB: 08/04/1945  Admit date: 10/14/2018 Date of Consult: 10/14/2018  Primary Care Provider: [name], MD Primary Cardiologist: [name], MD  Primary Electrophysiologist:  Cristopher Peru, MD    Patient Profile:   Ashley [name] is a 73 y.o. female with a hx of NICM w/p ICD, HTN and COPD who is being seen today for the evaluation of acute CHF at the request of Dr. Lorin Mercy, IM.   History of Present Illness:   Ashley [name] is a 73 y/o female with a h/o NICM s/p ICD. EF in the past as low as 15%, per pt. EF recovered. Last echo 12/2017 showed normal EF at 60-65%.  She is Followed by Dr. [name] in [place]. Dr. Lovena Le follows her device. Also has HTN and COPD..  She presents to the ED w/ CC of dyspnea and CP. Reports that she was diagnosed with bronchitis last week and prescribed prednisone and Levaquin by PCP. No improvement with treatment. Breathing and wheezing worsened. Has mild CP. Feels like heaviness and soreness in chest. Hurts to lay flat. Reports full med compliance. On Losartan, Coreg, spironolactone and torsemide. Could not afford Entreso in the past.  Admits to some dietary indiscretion w/ sodium. BP stable in the ED. EKG and tele shows sinus tach. 110s. BNP elevated at 774. CXR shows mild interstitial pulmonary edema.   Past Medical History:  Diagnosis Date  . Abdominal aortic atherosclerosis ([place]) 11/2015   by CT  . Allergy   . Anemia   . Anxiety   . Automatic implantable cardioverter-defibrillator in situ 2012   a. MDT single lead ICD.  Marland Kitchen Bronchiectasis    a. possibly mild, treat URIs aggressively  . Cancer (HCC)    basal cell, arms & neck  . Chronic sinusitis   . Depression   . Essential hypertension   . Fibrocystic breast disease   . Fibromyalgia   . GERD (gastroesophageal reflux disease)   . H/O multiple allergies 10/10/2013  . Hemorrhoids   . History of diverticulitis of  colon   . History of pneumonia 2012  . Insomnia   . Irritable bowel syndrome   . Laryngeal nodule    Dr. Thomasena Edis  . Migraine without aura, without mention of intractable migraine without mention of status migrainosus   . Mitral regurgitation   . Nonischemic cardiomyopathy ([place])    a. Recovered LV fxn - prev as low as 25%, now 50-55% by echo 04/2014;  b. 2012 s/p MDT [id number] [place] VR single lead ICD.  Marland Kitchen Osteoarthritis    knees, back, hands, low back,   . Osteoporosis 01/2013   T-2.7 spine (01/2016)  . PONV (postoperative nausea and vomiting)   . sigmoid diverticulitis with perforation, abscess and fistula 09/30/2013  . Sleep apnea 2010   borderline, inconclusive- /w [place]     Past Surgical History:  Procedure Laterality Date  . ABDOMINAL HYSTERECTOMY  1987   w/BSO  . ANAL RECTAL MANOMETRY N/A 02/02/2016   Procedure: ANO RECTAL MANOMETRY;  Surgeon: Mauri Pole, MD;  Location: [facility];  Service: Endoscopy;  Laterality: N/A;  . APPENDECTOMY    . AUGMENTATION MAMMAPLASTY    . breast cystectomies     due to FCBD  . BREAST MASS EXCISION  01/2010   Left-fibrocystic change w/intraductal papilloma, no malignancy  . carotid US  87/8676   7-20% LICA, normal RICA  . [place] Bunionectomy  11/04/08   Right Great Toe (  Dr. Beola Cord)  . COLONOSCOPY  01/2018   1 small benign polyp, diverticulosis, healthy anastomosis [name] @ [facility])  . COLOSTOMY N/A 10/02/2013   DESCENDING COLOSTOMY;  Surgeon: Imogene Burn. Tsuei, [place] N/A 02/10/2014   [name], MD  . EMG/MCV  10/16/01   + mild carpal tunnel  . ESOPHAGOGASTRODUODENOSCOPY  09/2014   small HH, irregular Z line, fundic gland polyps with mild gastritis ([place])  . Flex laryngoscopy  06/11/06   ([place]) nml  . IMPLANTABLE CARDIOVERTER DEFIBRILLATOR IMPLANT N/A 07/14/2011   MDT single chamber ICD  . KNEE ARTHROSCOPY W/ ORIF     Left; "meniscus tear"  . LASIK     left eye  . MANDIBLE SURGERY     "got about 6 pins  in the bottom of my jaw"  . NCS  11/2014   L ulnar neuropathy, mild B median nerve entrapment ([place])  . OSTEOTOMY     Left foot  . PARTIAL COLECTOMY N/A 10/02/2013   PARTIAL COLECTOMY;  Surgeon: Imogene Burn. Tsuei, MD  . sinus surgery     x3 with balloon   . TOE AMPUTATION     "toe beside baby toe on left foot; got gangrene from corn"  . TONSILLECTOMY AND ADENOIDECTOMY       Home Medications:  Prior to Admission medications   Medication Sig Start Date End Date Taking? Authorizing Provider  acetaminophen (TYLENOL) 500 MG tablet Take 500 mg by mouth daily as needed for moderate pain or headache (PAIN).    Yes [provider]  albuterol (PROVENTIL HFA;VENTOLIN HFA) 108 (90 Base) MCG/ACT inhaler TAKE 2 PUFFS BY MOUTH EVERY 6 HOURS AS NEEDED FOR WHEEZE OR SHORTNESS OF BREATH Patient taking differently: Inhale 2 puffs into the lungs every 6 (six) hours as needed for wheezing or shortness of breath.  01/10/18  Yes [name], MD  Ascorbic Acid (VITAMIN C) 1000 MG tablet Take 1,000 mg by mouth daily.   Yes [provider]  aspirin EC 81 MG tablet Take 81 mg by mouth at bedtime.    Yes [provider]  BIOTIN PO Take 1 tablet by mouth daily.   Yes [provider]  carvedilol (COREG) 6.25 MG tablet TAKE 1 TABLET (6.25 MG TOTAL) BY MOUTH 2 (TWO) TIMES DAILY WITH A MEAL. 06/06/18  Yes Minna Merritts, MD  cholecalciferol (VITAMIN D) 1000 units tablet Take 2,000 Units by mouth daily.   Yes [provider]  Coenzyme Q10 (CO Q 10 PO) Take 1 tablet by mouth daily.   Yes [provider]  dextromethorphan (DELSYM) 30 MG/5ML liquid Take 15 mg by mouth at bedtime as needed for cough.   Yes [provider]  Diphenhyd-Hydrocort-Nystatin (FIRST-DUKES MOUTHWASH) SUSP Swish and swallow 33ml every 6 hours as needed 04/05/18  Yes Vivi Barrack, MD  ezetimibe (ZETIA) 10 MG tablet TAKE 1 TABLET (10 MG TOTAL) BY MOUTH DAILY. Patient taking  differently: Take 10 mg by mouth at bedtime.  04/17/18  Yes [name], MD  GARLIC PO Take 1 tablet by mouth daily.   Yes [provider]  guaiFENesin (MUCINEX) 600 MG 12 hr tablet Take 600 mg by mouth daily.   Yes [provider]  levalbuterol (XOPENEX HFA) 45 MCG/ACT inhaler Inhale 2 puffs into the lungs every 4 (four) hours as needed for wheezing or shortness of breath. 09/20/18  Yes [name], MD  levofloxacin (LEVAQUIN) 500 MG tablet Take 1 tablet (500 mg total) by mouth  daily. Patient taking differently: Take 500 mg by mouth at bedtime.  10/09/18  Yes [name], MD  losartan (COZAAR) 25 MG tablet Take 1 tablet (25 mg total) by mouth daily. Patient taking differently: Take 12.5 mg by mouth See admin instructions. Takes Monday, Wednesday, and Friday and Saturday 01/17/18  Yes Gollan, Kathlene November, MD  nystatin cream (MYCOSTATIN) Apply 1 application topically 2 (two) times daily. Patient taking differently: Apply 1 application topically as needed (yeast).  06/22/17  Yes [name], MD  omega-3 acid ethyl esters (LOVAZA) 1 g capsule Take 1 g by mouth once a week. Monday   Yes [provider]  potassium chloride (K-DUR) 10 MEQ tablet Take 10 mEq by mouth once a week. Takes every 7-10 days 01/04/18  Yes [name], MD  predniSONE (DELTASONE) 20 MG tablet Take two tablets daily for 3 days followed by one tablet daily for 4 days Patient taking differently: Take 20 mg by mouth at bedtime. Take two tablets daily for 3 days followed by one tablet daily for 4 days 10/09/18  Yes [name], MD  spironolactone (ALDACTONE) 25 MG tablet Take 1 tablet (25 mg total) by mouth every other day. Patient taking differently: Take 12.5 mg by mouth every other day. Monday, Wednesday, Friday and Saturday 01/17/18  Yes Gollan, Kathlene November, MD  torsemide (DEMADEX) 10 MG tablet TAKE 1 TABLET (10 MG TOTAL) BY MOUTH DAILY AS NEEDED (SWELLING). Patient taking differently:  Take 10 mg by mouth once a week.  07/12/18  Yes [name], MD  vitamin B-12 (CYANOCOBALAMIN) 1000 MCG tablet Take 1 tablet (1,000 mcg total) by mouth daily. 05/31/18  Yes [name], MD  vitamin E 100 UNIT capsule Take 100 Units by mouth daily.   Yes [provider]  AMBULATORY NON FORMULARY MEDICATION Medication Name:  1 squatty potty to use as needed 01/20/16   Mauri Pole, MD  omeprazole (PRILOSEC) 20 MG capsule TAKE 1 CAPSULE BY MOUTH EVERY DAY Patient not taking: Reported on 10/14/2018 01/09/18   [name], MD  Red Yeast Rice 600 MG CAPS Take 1 capsule (600 mg total) by mouth 2 (two) times daily. Patient not taking: Reported on 10/14/2018 06/12/16   [name], MD  bisoprolol (ZEBETA) 5 MG tablet Take 5 mg by mouth daily. 06/06/11 07/02/18  Minna Merritts, MD    Inpatient Medications: Scheduled Meds: . docusate sodium  100 mg Oral Once  . sodium chloride flush  3 mL Intravenous Once   Continuous Infusions:  PRN Meds:   Allergies:    Allergies  Allergen Reactions  . Antihistamines, Chlorpheniramine-Type Other (See Comments)    Alters vision  . Codeine Other (See Comments)    Confusion and dizziness  . Cyclobenzaprine Other (See Comments)    Adverse reaction - back and throat pain, dizziness, exhaustion  . Cymbalta [Duloxetine Hcl] Other (See Comments)    "body spasms and made me feel weird in my head"   . Influenza Vac Split [Flu Virus Vaccine] Other (See Comments)    "allergic to concentrated eggs that are in vaccine"  . Prevnar [Pneumococcal 13-Val Conj Vacc] Other (See Comments)    Egg allergy Bad reaction after shot  . Saline Rash    FEVER, BURNING  . Sulfasalazine Other (See Comments)    "makes my whole body smell like sulfa & makes me sick just smelling it"  . Atorvastatin Other (See Comments)    Severe muscle cramps to generic atorvastatin.  Able to take  brand lipitor.  . Chlorhexidine Other (See Comments)    blisters  .  Dilaudid [Hydromorphone Hcl] Other (See Comments)    Feels like she is burning internally   . Latex Nausea Only, Rash and Other (See Comments)    Pain and infection  . Morphine And Related Other (See Comments)    Internally feels like she is on fire   . Pravastatin Other (See Comments)    myalgias  . Prevacid [Lansoprazole] Other (See Comments)    Worsened GI side effects  . Red Dye Nausea Only and Swelling  . Rosuvastatin Other (See Comments)    Was not effective controlling lipids  . Simvastatin Other (See Comments)    Leg cramps  . Diflucan [Fluconazole] Nausea And Vomiting  . Onion     Diarrhea, GI distress, flared IBS  . Other     NOTE: pt is able to take cephalosporins without reaction  . Penicillins Other (See Comments)    CHILDHOOD ALLEGY/REACTION: "told 50 years ago I couldn't take it; might be immune to it", able to take amoxicillin Has patient had a PCN reaction causing immediate rash, facial/tongue/throat swelling, SOB or lightheadedness with hypotension: Unknown Has patient had a PCN reaction causing severe rash involving mucus membranes or skin necrosis: UnknowN Has patient had a PCN reaction that required hospitalization: /Unknown Has patient had a PCN reaction occurring within the last 10 years: Unknown If a  . Sertraline Other (See Comments)    Overly sedating  . Tape Other (See Comments)    All adhesives  - Blisters, itching and burning   . Metaxalone Other (See Comments)    REACTION: unknown    Social History:   Social History   Socioeconomic History  . Marital status: Married    Spouse name: Not on file  . Number of children: 1  . Years of education: Not on file  . Highest education level: Not on file  Occupational History  . Occupation: Oncologist  . Occupation: [employer]  Social Needs  . Financial resource strain: Not on file  . Food insecurity:    Worry: Not on file    Inability: Not on file  .  Transportation needs:    Medical: Not on file    Non-medical: Not on file  Tobacco Use  . Smoking status: Never Smoker  . Smokeless tobacco: Never Used  . Tobacco comment: Lived with smokers x 23 yrs, smoked herself "for a week"  Substance and Sexual Activity  . Alcohol use: No    Alcohol/week: 0.0 standard drinks  . Drug use: No  . Sexual activity: Not Currently  Lifestyle  . Physical activity:    Days per week: Not on file    Minutes per session: Not on file  . Stress: Not on file  Relationships  . Social connections:    Talks on phone: Not on file    Gets together: Not on file    Attends religious service: Not on file    Active member of club or organization: Not on file    Attends meetings of clubs or organizations: Not on file    Relationship status: Not on file  . Intimate partner violence:    Fear of current or ex partner: Not on file    Emotionally abused: Not on file    Physically abused: Not on file    Forced sexual activity: Not on file  Other Topics Concern  . Not on file  Social History Narrative   Lives with husband Herbie [place]   1 adopted child    Family History:    Family History  Problem Relation Age of Onset  . Lung cancer Father        + smoker  . Colon polyps Father   . Dementia Mother   . Osteoporosis Mother        Lumbar spine  . Stroke Mother        x 5 @ 52 YOA  . Arthritis Mother        hands  . Emphysema Mother   . Alcohol abuse Paternal Grandfather   . Stroke Paternal Grandfather        ?  Marland Kitchen Heart disease Paternal Grandfather   . Hypertension Maternal Grandfather        ?  Marland Kitchen Heart disease Paternal Grandmother   . Colon cancer Neg Hx   . Esophageal cancer Neg Hx   . Gallbladder disease Neg Hx      ROS:  Please see the history of present illness.   All other ROS reviewed and negative.     Physical Exam/Data:   Vitals:   10/14/18 1530 10/14/18 1545 10/14/18 1615 10/14/18 1645  BP: (!) 142/97 (!) 158/123 136/81 (!) 141/87   Pulse: (!) 114 (!) 128 (!) 104 (!) 103  Resp: (!) 22 19 (!) 21 (!) 26  Temp:      TempSrc:      SpO2: 93% 95% 98% 99%   No intake or output data in the 24 hours ending 10/14/18 1733 Last 3 Weights 10/09/2018 09/20/2018 08/05/2018  Weight (lbs) 177 lb 7 oz 178 lb 8 oz 179 lb 8 oz  Weight (kg) 80.485 kg 80.967 kg 81.421 kg     There is no height or weight on file to calculate BMI.  General:  Well nourished, well developed, in no acute distress HEENT: normal Lymph: no adenopathy Neck: no JVD Endocrine:  No thryomegaly Vascular: No carotid bruits; FA pulses 2+ bilaterally without bruits  Cardiac:  normal S1, S2; RRR; no murmur  Lungs:  clear to auscultation bilaterally, no wheezing, rhonchi or rales  Abd: soft, nontender, no hepatomegaly  Ext: no edema Musculoskeletal:  No deformities, BUE and BLE strength normal and equal Skin: warm and dry  Neuro:  CNs 2-12 intact, no focal abnormalities noted Psych:  Normal affect   EKG:  The EKG was personally reviewed and demonstrates:  Sinus tach 110s. Not flutter  Telemetry:  Telemetry was personally reviewed and demonstrates:  Sinus tach 110s.  Relevant CV Studies: 2D Echo 12/2017 Study Conclusions  - Left ventricle: The cavity size was normal. There was mild   concentric hypertrophy. Systolic function was normal. The   estimated ejection fraction was in the range of 60% to 65%. Wall   motion was normal; there were no regional wall motion   abnormalities. Doppler parameters are consistent with abnormal   left ventricular relaxation (grade 1 diastolic dysfunction).   Doppler parameters are consistent with indeterminate ventricular   filling pressure. - Mitral valve: There was mild regurgitation. - Right ventricle: Pacer wire or catheter noted in right ventricle.  Laboratory Data:  Chemistry Recent Labs  Lab 10/14/18 1214  NA 139  K 3.8  CL 103  CO2 23  GLUCOSE 100*  BUN 19  CREATININE 1.20*  CALCIUM 10.0  GFRNONAA 45*  GFRAA  52*  ANIONGAP 13    No results for input(s): PROT, ALBUMIN, AST, ALT, ALKPHOS, BILITOT  in the last 168 hours. Hematology Recent Labs  Lab 10/14/18 1214  WBC 16.4*  RBC 4.22  HGB 12.6  HCT 39.5  MCV 93.6  MCH 29.9  MCHC 31.9  RDW 12.7  PLT 182   Cardiac Enzymes Recent Labs  Lab 10/14/18 1214  TROPONINI <0.03   No results for input(s): TROPIPOC in the last 168 hours.  BNP Recent Labs  Lab 10/14/18 1214  BNP 774.4*    DDimer No results for input(s): DDIMER in the last 168 hours.  Radiology/Studies:  Dg Chest 2 View  Result Date: 10/14/2018 CLINICAL DATA:  Worsening chest pain and shortness of breath. EXAM: CHEST - 2 VIEW COMPARISON:  Chest x-ray dated December 29, 2017. FINDINGS: Unchanged left chest wall pacemaker. Stable borderline cardiomegaly. Normal mediastinal contours. Pulmonary vascular congestion with mild interstitial thickening. No focal consolidation, pleural effusion, or pneumothorax. No acute osseous abnormality. IMPRESSION: 1. Mild interstitial pulmonary edema. Electronically Signed   By: Titus Dubin M.D.   On: 10/14/2018 11:07    Assessment and Plan:   ATIANNA HAIDAR is a 73 y.o. female with a hx of NICM w/p ICD, HTN and COPD who is being seen today for the evaluation of acute CHF at the request of Dr. Lorin Mercy, IM.   1. H/o NICM: EF in the past as low as 15% but recovered. Last echo in 12/2017 showed EF of 60-65%. Now with dyspnea and elevated BNP w/ CP showing mild edema, in the setting of acute bronchitis/COPDE. BNP 774 in the ED. EKG shows sinus tach. Normotensive. Bedside echo shows EF reduced again, appears to be ~15%. Full 2D echo pending. Recommend diuresis w/ IV Lasix. Strict I/Os. Daily BMPs and daily weights. Low sodium diet. Resume home HF regimen, losartan coreg and spironolactone. Cleotis Lema in the the past but too expensive. Will troponins x 3.   For questions or updates, please contact [place] Please consult [email or web address] for  contact info under     Signed, Lyda Jester, PA-C  10/14/2018 5:33 PM   Attending Note:   The patient was seen and examined.  Agree with assessment and plan as noted above.  Changes made to the above note as needed.  Patient seen and independently examined with  Lyda Jester, PA .   We discussed all aspects of the encounter. I agree with the assessment and plan as stated above.  1.  Acute on chronic combined CHF>;    EF is back down to 15% - 20%.   She was apparently hypotensive earlier but now is better.  Has been given a dose of lasix with good urine output so far.   Echo in Aug. 2019 showed an EF of 60-65%.   EF has declined since that time .  Will get troponin levels  She may need a R and L heart cath at some point .   Is on coreg, losartan, spiro Could not afford entresto    I have spent a total of 40 minutes with patient reviewing hospital  notes , telemetry, EKGs, labs and examining patient as well as establishing an assessment and plan that was discussed with the patient. > 50% of time was spent in direct patient care.    Thayer Headings, Brooke Bonito., MD, [facility] 10/14/2018, 5:47 PM [address],  [place] Pager [phone]

## 2018-10-14 NOTE — Telephone Encounter (Signed)
Patient called stating that she is not any better since seeing Dr. Danise Mina. Patient stated that she is having chest pain and SOB. Patient stated that she has an appointment scheduled with Dr. [name] tomorrow.  After speaking to Dr. Danise Mina patient was advised to go to the ER. Patient stated that she doesn't feel that she is reading to go to the ER. . Advised patient that her doctor feels that she needs to go. Patient agreed that she will have her husband take her to [facility] ER and will be there within 30 minutes. Called and spoke to [facility] Triage Nurse at [facility] and gave her the information on the patient and she verbalized understanding.

## 2018-10-14 NOTE — Telephone Encounter (Signed)
Pt c/o Shortness Of Breath: STAT if SOB developed within the last 24 hours or pt is noticeably SOB on the phone  1. Are you currently SOB (can you hear that pt is SOB on the phone)? yes  2. How long have you been experiencing SOB? Last Wednesday  3. Are you SOB when sitting or when up moving around? constant  4. Are you currently experiencing any other symptoms? Crackling in lungs, wheezing, hurting in back, feels like something sitting on her chest  Pt c/o of Chest Pain: STAT if CP now or developed within 24 hours  1. Are you having CP right now?  Pt is having burning as of now  2. Are you experiencing any other symptoms (ex. SOB, nausea, vomiting, sweating)? SOB  3. How long have you been experiencing CP? Since Wednesday, 6 days ago  4. Is your CP continuous or coming and going? constant  5. Have you taken Nitroglycerin? no ?

## 2018-10-14 NOTE — ED Notes (Signed)
Pt. Informed of called made to her husband.

## 2018-10-14 NOTE — Telephone Encounter (Signed)
Noted. Will await ER eval.  

## 2018-10-15 ENCOUNTER — Ambulatory Visit: Payer: Medicare Other | Admitting: Cardiovascular Disease

## 2018-10-15 DIAGNOSIS — Z95810 Presence of automatic (implantable) cardiac defibrillator: Secondary | ICD-10-CM

## 2018-10-15 DIAGNOSIS — E785 Hyperlipidemia, unspecified: Secondary | ICD-10-CM

## 2018-10-15 DIAGNOSIS — K219 Gastro-esophageal reflux disease without esophagitis: Secondary | ICD-10-CM

## 2018-10-15 DIAGNOSIS — N183 Chronic kidney disease, stage 3 (moderate): Secondary | ICD-10-CM

## 2018-10-15 LAB — CBC WITH DIFFERENTIAL/PLATELET
Abs Immature Granulocytes: 0.16 10*3/uL — ABNORMAL HIGH (ref 0.00–0.07)
Basophils Absolute: 0.1 10*3/uL (ref 0.0–0.1)
Basophils Relative: 0 %
Eosinophils Absolute: 0.1 10*3/uL (ref 0.0–0.5)
Eosinophils Relative: 0 %
HCT: 40.1 % (ref 36.0–46.0)
Hemoglobin: 13 g/dL (ref 12.0–15.0)
Immature Granulocytes: 1 %
Lymphocytes Relative: 29 %
Lymphs Abs: 6 10*3/uL — ABNORMAL HIGH (ref 0.7–4.0)
MCH: 29.7 pg (ref 26.0–34.0)
MCHC: 32.4 g/dL (ref 30.0–36.0)
MCV: 91.8 fL (ref 80.0–100.0)
Monocytes Absolute: 1.9 10*3/uL — ABNORMAL HIGH (ref 0.1–1.0)
Monocytes Relative: 9 %
Neutro Abs: 12.6 10*3/uL — ABNORMAL HIGH (ref 1.7–7.7)
Neutrophils Relative %: 61 %
Platelets: 196 10*3/uL (ref 150–400)
RBC: 4.37 MIL/uL (ref 3.87–5.11)
RDW: 12.8 % (ref 11.5–15.5)
WBC: 20.8 10*3/uL — ABNORMAL HIGH (ref 4.0–10.5)
nRBC: 0 % (ref 0.0–0.2)

## 2018-10-15 LAB — BASIC METABOLIC PANEL
Anion gap: 16 — ABNORMAL HIGH (ref 5–15)
BUN: 24 mg/dL — ABNORMAL HIGH (ref 8–23)
CO2: 25 mmol/L (ref 22–32)
Calcium: 9.5 mg/dL (ref 8.9–10.3)
Chloride: 98 mmol/L (ref 98–111)
Creatinine, Ser: 1.25 mg/dL — ABNORMAL HIGH (ref 0.44–1.00)
GFR calc Af Amer: 49 mL/min — ABNORMAL LOW (ref >60)
GFR calc non Af Amer: 43 mL/min — ABNORMAL LOW (ref >60)
Glucose, Bld: 105 mg/dL — ABNORMAL HIGH (ref 70–99)
Potassium: 3.6 mmol/L (ref 3.5–5.1)
Sodium: 139 mmol/L (ref 135–145)

## 2018-10-15 LAB — GLUCOSE, CAPILLARY: Glucose-Capillary: 132 mg/dL — ABNORMAL HIGH (ref 70–99)

## 2018-10-15 LAB — TROPONIN I
Troponin I: 0.04 ng/mL (ref <0.03)
Troponin I: 0.03 ng/mL (ref <0.03)

## 2018-10-15 MED ORDER — SODIUM CHLORIDE 0.9 % IV BOLUS
250.0000 mL | Freq: Once | INTRAVENOUS | Status: AC
  Administered 2018-10-15: 250 mL via INTRAVENOUS

## 2018-10-15 MED ORDER — CARVEDILOL 3.125 MG PO TABS
3.1250 mg | ORAL_TABLET | Freq: Two times a day (BID) | ORAL | Status: DC
  Administered 2018-10-15 – 2018-10-19 (×4): 3.125 mg via ORAL
  Filled 2018-10-15 (×6): qty 1

## 2018-10-15 MED ORDER — FUROSEMIDE 10 MG/ML IJ SOLN: 40.0000 mg | Freq: Two times a day (BID) | INTRAMUSCULAR | Status: DC

## 2018-10-15 MED ORDER — FUROSEMIDE 10 MG/ML IJ SOLN
20.0000 mg | Freq: Two times a day (BID) | INTRAMUSCULAR | Status: DC
  Administered 2018-10-17 – 2018-10-19 (×4): 20 mg via INTRAVENOUS
  Filled 2018-10-15 (×6): qty 2

## 2018-10-15 MED ORDER — HYDROCORTISONE NA SUCCINATE PF 100 MG IJ SOLR
100.0000 mg | Freq: Three times a day (TID) | INTRAMUSCULAR | Status: AC
  Administered 2018-10-15 – 2018-10-16 (×3): 100 mg via INTRAVENOUS
  Filled 2018-10-15 (×3): qty 2

## 2018-10-15 NOTE — TOC Initial Note (Addendum)
Transition of Care [facility]) - Initial/Assessment Note    Patient Details  Name: Ashley [name] MRN: [id number] Date of Birth: 1945/06/19  [facility]) CM/SW Contact:    Sharin Mons, RN Phone Number: 10/15/2018, 1:59 PM  Clinical Narrative:          Pt presents with CP, SOB. Hx of OSA; diverticulitis with perforation, abscess, and fistula (2015); NICM with AICD; COPD; and HTN. Resides with husband. States PTA  Independent with ADL's, no DME usage. Pt states her cardiologist is [name] and EP doctor is Dr. Lovena Le.   NCM received consult: Heart Failure screen. Pt states she very educated regarding heart failure. States has a scale and weighs daily, and f/u with heart MD's.  Plan: R and L heart cath , 6/3. N CM will continue to monitor  For TOC needs....   10/17/2018 s/p RIGHT/LEFT HEART CATH AND CORONARY ANGIOGRAPHY, EF 20-25%  Expected Discharge Plan: Home/Self Care Barriers to Discharge: Continued Medical Work up   Patient Goals and CMS Choice Patient states their goals for this hospitalization and ongoing recovery are:: to stay alive! CMS Medicare.gov Compare Post Acute Care list provided to:: Patient Choice offered to / list presented to : Patient  Expected Discharge Plan and Services Expected Discharge Plan: Home/Self Care                                     [facility] Agency: [place] [facility]Pt chose [facility] for home health services if needed @ d/c)        Prior Living Arrangements/Services   Lives with:: Spouse Patient language and need for interpreter reviewed:: Yes Do you feel safe going back to the place where you live?: Yes      Need for Family Participation in Patient Care: No (Comment) Care giver support system in place?: Yes (comment)   Criminal Activity/Legal Involvement Pertinent to Current Situation/Hospitalization: No - Comment as needed  Activities of Daily Living Home Assistive Devices/Equipment: None ADL  Screening (condition at time of admission) Patient's cognitive ability adequate to safely complete daily activities?: Yes Is the patient deaf or have difficulty hearing?: No Does the patient have difficulty seeing, even when wearing glasses/contacts?: No Does the patient have difficulty concentrating, remembering, or making decisions?: No Patient able to express need for assistance with ADLs?: Yes Does the patient have difficulty dressing or bathing?: Yes Independently performs ADLs?: Yes (appropriate for developmental age) Does the patient have difficulty walking or climbing stairs?: Yes Weakness of Legs: Both Weakness of Arms/Hands: None  Permission Sought/Granted Permission sought to share information with : Family Supports, Case Manager Permission granted to share information with : Yes, Verbal Permission Granted  Share Information with NAME: Okey Regal Messing (Spouse)Robert L Guettler            Emotional Assessment Appearance:: Appears stated age Attitude/Demeanor/Rapport: Engaged Affect (typically observed): Accepting Orientation: : Oriented to Self, Oriented to Place, Oriented to  Time, Oriented to Situation Alcohol / Substance Use: Not Applicable Psych Involvement: No (comment)  Admission diagnosis:  Acute congestive heart failure, unspecified heart failure type ([place]) [I50.9] Acute respiratory distress [R06.03] Patient Active Problem List   Diagnosis Date Noted  . Acute on chronic combined systolic and diastolic CHF (congestive heart failure) ([place]) 10/14/2018  . Acute respiratory distress 10/14/2018  . Bilateral leg numbness 06/01/2018  . Bursitis of right hip 06/01/2018  . Right ear pain  02/16/2018  . Back pain 02/14/2018  . UTI (urinary tract infection) 02/14/2018  . Mixed hyperlipidemia 01/17/2018  . Cervical lymphadenopathy 01/05/2018  . Plantar fasciitis, bilateral 12/13/2017  . Acquired renal cyst of right kidney 08/11/2017  . Unsteadiness on feet 07/24/2017   . Multiple rib fractures 06/19/2017  . 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 High total serum IgM 11/22/2016  . 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 Bleeding external hemorrhoids 10/31/2016  . LBBB (left bundle branch block) 07/11/2016  . Gastritis 06/12/2016  . Bladder wall thickening 02/16/2016  . Fecal incontinence   . Chronic generalized abdominal pain 11/22/2015  . Abdominal aortic atherosclerosis ([place]) 11/13/2015  . 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  . Hoarseness 06/21/2015  . Hypersensitivity to pneumococcal vaccine 06/10/2015  . Medicare annual wellness visit, subsequent 06/08/2015  . Advanced care planning/counseling discussion 06/08/2015  . Intertrigo 04/06/2015  . Chronic daily headache 04/06/2015  . Peripheral neuropathy 04/06/2015  . Nonischemic cardiomyopathy ([place])   . Chest pain and shortness of breath 12/25/2014  . S/P colostomy takedown 02/10/2014  . ICD (implantable cardioverter-defibrillator) in place 10/23/2013  . H/O multiple allergies 10/10/2013  . History of diverticular abscess 09/25/2013  . Dyspnea 08/21/2013  . Osteoporosis 01/13/2013  . Irregular heart beat 08/06/2012  . MDD (major depressive disorder), recurrent episode, moderate ([place]) 07/09/2012  . Chronic recurrent sinusitis 06/17/2012  . Vitamin D deficiency 03/05/2012  . Essential tremor 12/29/2011  . GERD (gastroesophageal reflux disease) 11/13/2011  . Allergic rhinitis 11/13/2011  . CKD (chronic kidney disease) stage 3, GFR 30-59 ml/min (HCC) 11/09/2011  . Epistaxis, recurrent 10/11/2011  . Acute hypotension 04/30/2011  . Cardiomyopathy, dilated, nonischemic ([place]) 03/01/2011  . Mitral regurgitation 02/24/2011  . Chronic systolic CHF (congestive heart failure) ([place]) 02/24/2011  . Chronic bronchitis ([place]) 01/20/2011  . Syncope 11/02/2010  . Chronic fatigue and malaise 11/02/2010  . Anxiety state 08/18/2010  . 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  . Dyslipidemia 11/22/2006  . COMMON MIGRAINE  11/22/2006  . Irritable bowel syndrome 11/22/2006  . FIBROCYSTIC BREAST DISEASE 11/22/2006  . Osteoarthritis 11/22/2006  . Fibromyalgia 11/22/2006   PCP:  [name], MD Pharmacy:   [id number] [address], [place] [address] Phone: [phone] Fax: [phone]     Social Determinants of Health (SDOH) Interventions    Readmission Risk Interventions No flowsheet data found.

## 2018-10-15 NOTE — Progress Notes (Addendum)
Progress Note  Patient Name: Ashley [name] Date of Encounter: 10/15/2018  Primary Cardiologist: [name], MD   Subjective   Feeling poorly this morning. SOB unchanged.   Inpatient Medications    Scheduled Meds: . aspirin EC  81 mg Oral QHS  . carvedilol  6.25 mg Oral BID WC  . enoxaparin (LOVENOX) injection  40 mg Subcutaneous Q24H  . ezetimibe  10 mg Oral QHS  . furosemide  40 mg Intravenous Q12H  . guaiFENesin  600 mg Oral Daily  . hydrocortisone sod succinate (SOLU-CORTEF) inj  100 mg Intravenous Q8H  . omega-3 acid ethyl esters  1 g Oral Weekly  . sodium chloride flush  3 mL Intravenous Q12H  . traMADol  50 mg Oral Once   Continuous Infusions: . sodium chloride     PRN Meds: sodium chloride, acetaminophen, albuterol, dextromethorphan, ondansetron (ZOFRAN) IV, sodium chloride flush   Vital Signs    Vitals:   10/15/18 0654 10/15/18 0729 10/15/18 0743 10/15/18 0758  BP: (!) 79/61 (!) 78/57 (!) 93/56 (!) 94/56  Pulse: 82 73 71 72  Resp: (!) 23 (!) 25 18 15   Temp:      TempSrc:      SpO2: 94% 98% 93% (!) 85%  Weight: 81.1 kg       Intake/Output Summary (Last 24 hours) at 10/15/2018 0920 Last data filed at 10/14/2018 2045 Gross per 24 hour  Intake -  Output 600 ml  Net -600 ml   Last 3 Weights 10/15/2018 10/09/2018 09/20/2018  Weight (lbs) 178 lb 12.7 oz 177 lb 7 oz 178 lb 8 oz  Weight (kg) 81.1 kg 80.485 kg 80.967 kg      Telemetry    NSR - Personally Reviewed  ECG    Sinus rhythm with LBBB - Personally Reviewed  Physical Exam   GEN: No acute distress.   Neck: No JVD Cardiac: RRR, no murmurs, rubs, or gallops.  Respiratory: Clear to auscultation bilaterally. GI: Soft, nontender, non-distended  MS: No edema; No deformity. Neuro:  Nonfocal  Psych: Normal affect   Labs    Chemistry Recent Labs  Lab 10/14/18 1214 10/15/18 0643  NA 139 139  K 3.8 3.6  CL 103 98  CO2 23 25  GLUCOSE 100* 105*  BUN 19 24*  CREATININE 1.20* 1.25*   CALCIUM 10.0 9.5  GFRNONAA 45* 43*  GFRAA 52* 49*  ANIONGAP 13 16*     Hematology Recent Labs  Lab 10/14/18 1214 10/14/18 2339  WBC 16.4* 20.8*  RBC 4.22 4.37  HGB 12.6 13.0  HCT 39.5 40.1  MCV 93.6 91.8  MCH 29.9 29.7  MCHC 31.9 32.4  RDW 12.7 12.8  PLT 182 196    Cardiac Enzymes Recent Labs  Lab 10/14/18 1214 10/14/18 1950 10/14/18 2339 10/15/18 0643  TROPONINI <0.03 0.03* 0.04* 0.03*   No results for input(s): TROPIPOC in the last 168 hours.   BNP Recent Labs  Lab 10/14/18 1214  BNP 774.4*     DDimer No results for input(s): DDIMER in the last 168 hours.   Radiology    Dg Chest 2 View  Result Date: 10/14/2018 CLINICAL DATA:  Worsening chest pain and shortness of breath. EXAM: CHEST - 2 VIEW COMPARISON:  Chest x-ray dated December 29, 2017. FINDINGS: Unchanged left chest wall pacemaker. Stable borderline cardiomegaly. Normal mediastinal contours. Pulmonary vascular congestion with mild interstitial thickening. No focal consolidation, pleural effusion, or pneumothorax. No acute osseous abnormality. IMPRESSION: 1. Mild interstitial pulmonary edema.  Electronically Signed   By: Titus Dubin M.D.   On: 10/14/2018 11:07    Cardiac Studies   Echo 10/14/2018 IMPRESSIONS    1. The left ventricle has a 2D calculated ejection fraction 15-20%%. The cavity size was mildly dilated. Left ventricular diastolic Doppler parameters are indeterminate.  2. The right ventricle has normal systolic function. The cavity was normal. There is no increase in right ventricular wall thickness.  3. Left atrial size was moderately dilated.  4. The mitral valve is rheumatic. Moderate thickening of the mitral valve leaflet. There is mild to moderate mitral annular calcification present. Mitral valve regurgitation is severe by color flow Doppler. The MR jet is centrally-directed.  5. The aortic valve is tricuspid. Mild calcification of the aortic valve.  6. The aortic root and ascending  aorta are normal in size and structure.  7. The interatrial septum was not well visualized.  Patient Profile     73 y.o. female with PMH of NICM s/p ICD (EF improved to 60-65% by echo 12/2017), HTN and COPD who presented with CP and dyspnea. EF down to 15% again.   Assessment & Plan    1. Acute systolic CHF:   - previous had low EF, improved to 60-65% by 2019. However repeat Echo during this admission shows EF down to 15-20% again.   - given significant hypotension this morning, I suspect she is volume depleted and the low EF compounded the situation further, discussed with Dr. Acie Fredrickson who is considering L&RHC  2. NICM s/p ICD: on coreg, losartan and spironolactone. Could not afford entresto in the past  - per patient, she had bronchitis 2 weeks ago. She says previous low pumping function was also preceded by viral symptoms. However I cannot locate any cath report  3. 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 Once fully diureses, may need L and R heart cath to assess CAD and cardiac output  4. HTN: had significant hypotension this morning with SBP down to 40s, losartan, spironolactone all held. Morning coreg held.   5. Thrombocytosis: on steroid  6. COPD      For questions or updates, please contact [facility] Please consult [email or web address] for contact info under        Signed, Almyra Deforest, [place]  10/15/2018, 9:20 AM      Attending Note:   The patient was seen and examined.  Agree with assessment and plan as noted above.  Changes made to the above note as needed.  Patient seen and independently examined with Almyra Deforest, PA .   We discussed all aspects of the encounter. I agree with the assessment and plan as stated above.  1.   Acute on chronic combined CHF :   She has had recurrent episodes of acute decompensation .   Will shcedule for R and L heart cath tomorrow .  I've discussed the risks,  benefits, options.   She understands and agrees to proceed.   She was hypotensive this am - we have had to hold her meds.  Restart meds when able     I have spent a total of 40 minutes with patient reviewing hospital  notes , telemetry, EKGs, labs and examining patient as well as establishing an assessment and plan that was discussed with the patient. > 50% of time was spent in direct patient care.    Thayer Headings, Brooke Bonito., MD, [facility] 10/15/2018,  11:25 AM [address],  [place] Pager [phone]

## 2018-10-15 NOTE — Evaluation (Signed)
Physical Therapy Evaluation Patient Details Name: Ashley [name] MRN: [id number] DOB: 03-Aug-1945 Today's Date: 10/15/2018   History of Present Illness  73 year old female with medical history significant for obstructive sleep apnea, nonischemic cardiomyopathy with AICD, COPD, hypertension and hyperlipidemia; who presented to the emergency department secondary to chest discomfort and shortness of breath.  Recently treated as an outpatient for concern of COPD exacerbation with prednisone and antibiotic without significant improvement in her symptoms.  Elevated BNP and vascular congestion appreciated on chest x-ray.  Patient has been admitted for further evaluation and management of CHF exacerbation and chest discomfort.  Clinical Impression   Pt admitted with above diagnosis. Pt currently with functional limitations due to the deficits listed below (see PT Problem List). Managing independently prior to admission; Presents with decr activity tolerance, with noted tendency for HR to get tachy with activity (HR 133 observed highest with standing activity); Noted for cardiac cath tomorrow; Will follow and update recs as needed post Cardiac Cath;  Pt will benefit from skilled PT to increase their independence and safety with mobility to allow discharge to the venue listed below.       Follow Up Recommendations Home health PT;Supervision/Assistance - 24 hour(Will consider HH PT -- she may not need)    Equipment Recommendations  Other (comment)(to be determined post cardiac cath)    Recommendations for Other Services       Precautions / Restrictions Precautions Precaution Comments: Watch HR; tachy with minimal activity      Mobility  Bed Mobility Overal bed mobility: Independent                Transfers Overall transfer level: Needs assistance Equipment used: 1 person hand held assist Transfers: Sit to/from Stand Sit to Stand: Min guard         General transfer comment:  minguard for safety; cues to self-monitor for activity tolerance  Ambulation/Gait             General Gait Details: Deferred  Stairs            Wheelchair Mobility    Modified Rankin (Stroke Patients Only)       Balance Overall balance assessment: Mild deficits observed, not formally tested                                           Pertinent Vitals/Pain Pain Assessment: No/denies pain    Home Living Family/patient expects to be discharged to:: Private residence Living Arrangements: Spouse/significant other Available Help at Discharge: Family Type of Home: House Home Access: Stairs to enter   Technical brewer of Steps: (to be determined)   Home Equipment: Environmental consultant - 2 wheels      Prior Function Level of Independence: Independent         Comments: She LOVEs to go to [employer]        Extremity/Trunk Assessment   Upper Extremity Assessment Upper Extremity Assessment: Overall WFL for tasks assessed    Lower Extremity Assessment Lower Extremity Assessment: Generalized weakness       Communication   Communication: No difficulties  Cognition Arousal/Alertness: Awake/alert Behavior During Therapy: WFL for tasks assessed/performed Overall Cognitive Status: Within Functional Limits for tasks assessed  General Comments General comments (skin integrity, edema, etc.):   10/15/18 1456  Vital Signs  Patient Position (if appropriate) Orthostatic Vitals  Orthostatic Lying   BP- Lying 110/58  Pulse- Lying 106  Orthostatic Sitting  BP- Sitting 98/74  Pulse- Sitting 118  Orthostatic Standing at 0 minutes  BP- Standing at 0 minutes 112/66  Pulse- Standing at 0 minutes 125  Orthostatic Standing at 3 minutes  BP- Standing at 3 minutes 108/84  Pulse- Standing at 3 minutes 133       Exercises     Assessment/Plan    PT Assessment Patient needs  continued PT services  PT Problem List Decreased strength;Decreased activity tolerance;Decreased mobility;Decreased knowledge of use of DME;Decreased safety awareness;Decreased knowledge of precautions;Cardiopulmonary status limiting activity       PT Treatment Interventions DME instruction;Gait training;Stair training;Functional mobility training;Therapeutic activities;Therapeutic exercise;Balance training    PT Goals (Current goals can be found in the Care Plan section)  Acute Rehab PT Goals Patient Stated Goal: She would like to get back to [facility] PT Goal Formulation: With patient Time For Goal Achievement: 10/29/18 Potential to Achieve Goals: Good    Frequency Min 3X/week   Barriers to discharge        Co-evaluation               AM-PAC PT "6 Clicks" Mobility  Outcome Measure Help needed turning from your back to your side while in a flat bed without using bedrails?: None Help needed moving from lying on your back to sitting on the side of a flat bed without using bedrails?: None Help needed moving to and from a bed to a chair (including a wheelchair)?: A Little Help needed standing up from a chair using your arms (e.g., wheelchair or bedside chair)?: A Little Help needed to walk in hospital room?: A Little Help needed climbing 3-5 steps with a railing? : A Little 6 Click Score: 20    End of Session   Activity Tolerance: Patient tolerated treatment well Patient left: in bed;with call bell/phone within reach Nurse Communication: Mobility status PT Visit Diagnosis: Other abnormalities of gait and mobility (R26.89);Other (comment)(Decr functional capacity)    Time: 6073-7106 PT Time Calculation (min) (ACUTE ONLY): 17 min   Charges:   PT Evaluation $PT Eval Moderate Complexity: 1 Mod          Roney [place], [place]  Acute Rehabilitation Services Pager [phone] Office [phone]   [place] 10/15/2018, 4:16 PM

## 2018-10-15 NOTE — Progress Notes (Signed)
Pt Bp 79/61. Pt c/o feeling week and unable to get OOB, unlike before when pt was able to use BSC throughout the night. On call NP, Baltazar Najjar, made aware. Will pass on to day shift RN in report and continue to monitor and treat per orders.

## 2018-10-15 NOTE — Progress Notes (Signed)
Pt screaming out that she feels like she going to faint. Pt c/o 3/10 chest pressure and continued to c/o weakness and being lightheaded. BP reassesed. SBP dropped down to 30s on PC monitor. Pt continued to remain alert and oriented x 4. CN and Rapid response called for assistance. Manual BP check showed systolic pressure in the 47M. Pt given 250 cc bolus per rapid response. MD and day shift RN also present at bedside. IV team consult placed for second IV placement.   At 0758, pt BP rose to 94/56. Pt alert and states she "feels much better". Day shift RN present and aware.

## 2018-10-15 NOTE — Progress Notes (Signed)
PROGRESS NOTE    Ashley [name]  WUJ:[id number] DOB: [birth year] DOA: 10/14/2018 PCP: [name], MD     Brief Narrative:  73 year old female with medical history significant for obstructive sleep apnea, nonischemic cardiomyopathy with AICD, COPD, hypertension and hyperlipidemia; who presented to the emergency department secondary to chest discomfort and shortness of breath.  Recently treated as an outpatient for concern of COPD exacerbation with prednisone and antibiotic without significant improvement in her symptoms.  Elevated BNP and vascular congestion appreciated on chest x-ray.  Patient has been admitted for further evaluation and management of CHF exacerbation and chest discomfort.   Assessment & Plan: 1-Acute on chronic combined systolic and diastolic CHF (congestive heart failure) (HCC) -Continue low-sodium diet, daily weight and strict I's and O's -Patient on beta-blocker and ARB -Bedside echo in ED for cardiology service demonstrated once again decreasing her ejection fraction. -Planning to stabilize volume status with possible right and left heart cath during this hospitalization.  2-acute symptomatic hypotension -In the setting of aggressive diuresis -250 cc bolus has been given -Hold diuretics and adjust dose with intention to resume diuresis in a.m. -Adjust dose of beta-blocker -Hold spironolactone and losartan. -Follow vital signs closely -will give 3 doses of solucortef.  3-mixed HLD -continue statins and zetia  4-CKD (chronic kidney disease) stage 3, GFR 30-59 ml/min (HCC) -Cr stable -will follow renal function trend   5-ICD (implantable cardioverter-defibrillator) in place -appears to be working as intended -HR in the 70's -no firing activity  6-GERD -continue PPI  7-COPD -currently no wheezing  -continue PRN albuterol   DVT prophylaxis: Lovenox- Code Status: Full code Family Communication: No family at bedside. Disposition Plan: Remains  inpatient, follow daily weights, low-sodium diet and strict I's and O's.  Healing acute hypotensive episode will hold diuresis, ARB, and modify beta-blocker dose.  Continue checking on telemetry and follow troponins.  Consultants:   Cardiology service   Procedures:   See below for x-ray reports.   Antimicrobials:  Anti-infectives (From admission, onward)   None       Subjective: Afebrile, no nausea, no vomiting.  Reports having some chest pressure feeling lightheaded and seeing spots.  Patient blood pressure systolic in the 95A.  Objective: Vitals:   10/15/18 0729 10/15/18 0743 10/15/18 0758 10/15/18 0914  BP: (!) 78/57 (!) 93/56 (!) 94/56 (!) 87/64  Pulse: 73 71 72 75  Resp: (!) 25 18 15 15   Temp:      TempSrc:      SpO2: 98% 93% (!) 85% 94%  Weight:        Intake/Output Summary (Last 24 hours) at 10/15/2018 0934 Last data filed at 10/14/2018 2045 Gross per 24 hour  Intake -  Output 600 ml  Net -600 ml   Filed Weights   10/15/18 0654  Weight: 81.1 kg    Examination: General exam: Alert, awake, oriented x 3, feeling dizzy, lightheaded and was for patient.  Reports some chest pressure but not frank pain.  No nausea, no vomiting, no abdominal pain. Respiratory system: Decreased breath sounds at the bases, no frank crackles, no wheezing.  No using accessory muscles currently. Cardiovascular system:Rate controlled, no rubs, no gallops, no murmur. Gastrointestinal system: Abdomen is nondistended, soft and nontender. No organomegaly or masses felt. Normal bowel sounds heard. Central nervous system: Alert and oriented. No focal neurological deficits. Extremities: No nodules or clubbing; positive trace edema in her legs. Skin: No rashes, lesions or ulcers Psychiatry: Judgement and insight appear normal. Mood & affect appropriate.  Data Reviewed: I have personally reviewed following labs and imaging studies  CBC: Recent Labs  Lab 10/14/18 1214 10/14/18 2339  WBC  16.4* 20.8*  NEUTROABS  --  12.6*  HGB 12.6 13.0  HCT 39.5 40.1  MCV 93.6 91.8  PLT 182 263   Basic Metabolic Panel: Recent Labs  Lab 10/14/18 1214 10/15/18 0643  NA 139 139  K 3.8 3.6  CL 103 98  CO2 23 25  GLUCOSE 100* 105*  BUN 19 24*  CREATININE 1.20* 1.25*  CALCIUM 10.0 9.5   GFR: Estimated Creatinine Clearance: 42.1 mL/min (A) (by C-G formula based on SCr of 1.25 mg/dL (H)).  Cardiac Enzymes: Recent Labs  Lab 10/14/18 1214 10/14/18 1950 10/14/18 2339 10/15/18 0643  TROPONINI <0.03 0.03* 0.04* 0.03*   CBG: Recent Labs  Lab 10/15/18 0718  GLUCAP 132*   Urine analysis:    Component Value Date/Time   COLORURINE STRAW (A) 12/29/2017 1520   APPEARANCEUR CLEAR 12/29/2017 1520   LABSPEC 1.008 12/29/2017 1520   PHURINE 5.0 12/29/2017 1520   GLUCOSEU NEGATIVE 12/29/2017 1520   HGBUR NEGATIVE 12/29/2017 1520   BILIRUBINUR negative 08/05/2018 1416   KETONESUR NEGATIVE 12/29/2017 1520   PROTEINUR Negative 08/05/2018 1416   PROTEINUR NEGATIVE 12/29/2017 1520   UROBILINOGEN 0.2 08/05/2018 1416   UROBILINOGEN 0.2 05/27/2014 1952   NITRITE negative 08/05/2018 1416   NITRITE NEGATIVE 12/29/2017 1520   LEUKOCYTESUR Negative 08/05/2018 1416    Recent Results (from the past 240 hour(s))  Novel Coronavirus, NAA ([facility])     Status: None   Collection Time: 10/09/18  2:58 PM  Result Value Ref Range Status   SARS-CoV-2, NAA Not Detected Not Detected Final    Comment: This test was developed and its performance characteristics determined by [employer]. This test has not been FDA cleared or approved. This test has been authorized by FDA under an Emergency Use Authorization (EUA). This test is only authorized for the duration of time the declaration that circumstances exist justifying the authorization of the emergency use of in vitro diagnostic tests for detection of SARS-CoV-2 virus and/or diagnosis of COVID-19 infection under section 564(b)(1) of the  Act, 21 U.S.C. 335KTG-2(B)(6), unless the authorization is terminated or revoked sooner. When diagnostic testing is negative, the possibility of a false negative result should be considered in the context of a patient's recent exposures and the presence of clinical signs and symptoms consistent with COVID-19. An individual without symptoms of COVID-19 and who is not shedding SARS-CoV-2 virus would expect to have a negative (not detected) result in this assay.   SARS Coronavirus 2 (CEPHEID - Performed in [place] hospital lab), Hosp Order     Status: None   Collection Time: 10/14/18 12:15 PM  Result Value Ref Range Status   SARS Coronavirus 2 NEGATIVE NEGATIVE Final    Comment: (NOTE) If result is NEGATIVE SARS-CoV-2 target nucleic acids are NOT DETECTED. The SARS-CoV-2 RNA is generally detectable in upper and lower  respiratory specimens during the acute phase of infection. The lowest  concentration of SARS-CoV-2 viral copies this assay can detect is 250  copies / mL. A negative result does not preclude SARS-CoV-2 infection  and should not be used as the sole basis for treatment or other  patient management decisions.  A negative result may occur with  improper specimen collection / handling, submission of specimen other  than nasopharyngeal swab, presence of viral mutation(s) within the  areas targeted by this assay, and inadequate number of  viral copies  (<250 copies / mL). A negative result must be combined with clinical  observations, patient history, and epidemiological information. If result is POSITIVE SARS-CoV-2 target nucleic acids are DETECTED. The SARS-CoV-2 RNA is generally detectable in upper and lower  respiratory specimens dur ing the acute phase of infection.  Positive  results are indicative of active infection with SARS-CoV-2.  Clinical  correlation with patient history and other diagnostic information is  necessary to determine patient infection status.   Positive results do  not rule out bacterial infection or co-infection with other viruses. If result is PRESUMPTIVE POSTIVE SARS-CoV-2 nucleic acids MAY BE PRESENT.   A presumptive positive result was obtained on the submitted specimen  and confirmed on repeat testing.  While 2019 novel coronavirus  (SARS-CoV-2) nucleic acids may be present in the submitted sample  additional confirmatory testing may be necessary for epidemiological  and / or clinical management purposes  to differentiate between  SARS-CoV-2 and other Sarbecovirus currently known to infect humans.  If clinically indicated additional testing with an alternate test  methodology [phone]) is advised. The SARS-CoV-2 RNA is generally  detectable in upper and lower respiratory sp ecimens during the acute  phase of infection. The expected result is Negative. Fact Sheet for Patients:  StrictlyIdeas.no Fact Sheet for Healthcare Providers: BankingDealers.co.za This test is not yet approved or cleared by the [place] FDA and has been authorized for detection and/or diagnosis of SARS-CoV-2 by FDA under an Emergency Use Authorization (EUA).  This EUA will remain in effect (meaning this test can be used) for the duration of the COVID-19 declaration under Section 564(b)(1) of the Act, 21 U.S.C. section 360bbb-3(b)(1), unless the authorization is terminated or revoked sooner. Performed at [facility], [place] [address] [place], [place] [address]   MRSA PCR Screening     Status: None   Collection Time: 10/14/18  7:59 PM  Result Value Ref Range Status   MRSA by PCR NEGATIVE NEGATIVE Final    Comment:        The GeneXpert MRSA Assay (FDA approved for NASAL specimens only), is one component of a comprehensive MRSA colonization surveillance program. It is not intended to diagnose MRSA infection nor to guide or monitor treatment for MRSA infections. Performed at [facility], [place] [address], [place], [place] [address]      Radiology Studies: Dg Chest 2 View  Result Date: 10/14/2018 CLINICAL DATA:  Worsening chest pain and shortness of breath. EXAM: CHEST - 2 VIEW COMPARISON:  Chest x-ray dated December 29, 2017. FINDINGS: Unchanged left chest wall pacemaker. Stable borderline cardiomegaly. Normal mediastinal contours. Pulmonary vascular congestion with mild interstitial thickening. No focal consolidation, pleural effusion, or pneumothorax. No acute osseous abnormality. IMPRESSION: 1. Mild interstitial pulmonary edema. Electronically Signed   By: Titus Dubin M.D.   On: 10/14/2018 11:07   Scheduled Meds: . aspirin EC  81 mg Oral QHS  . carvedilol  3.125 mg Oral BID WC  . enoxaparin (LOVENOX) injection  40 mg Subcutaneous Q24H  . ezetimibe  10 mg Oral QHS  . [START ON 10/16/2018] furosemide  20 mg Intravenous Q12H  . guaiFENesin  600 mg Oral Daily  . hydrocortisone sod succinate (SOLU-CORTEF) inj  100 mg Intravenous Q8H  . omega-3 acid ethyl esters  1 g Oral Weekly  . sodium chloride flush  3 mL Intravenous Q12H  . traMADol  50 mg Oral Once   Continuous Infusions: . sodium chloride  LOS: 1 day    Time spent: 35 minutes. Greater than 50% of this time was spent in direct contact with the patient, coordinating care and discussing relevant ongoing clinical issues, including symptomatic hypotension, concerns for decrease in her ejection fraction and explanation about acute on chronic heart failure exacerbation.  Hold antihypertensive agents, adjust diuretics and follow blood pressure/heart rate only.  Follow cardiology recommendations, continue monitoring troponin.     Barton Dubois, MD [facility] Pager [phone]   10/15/2018, 9:34 AM

## 2018-10-15 NOTE — Significant Event (Signed)
Rapid Response Event Note  Overview: Cardiac - Hypotension  Initial Focused Assessment: Called by PM Charge RN about patient's BP being low, per nurse, SBP in the 70s via automatic cuff but manual BPs - SBP in the 40s. Patient felt " chest pressure" 3/10, was lightheaded, and was "seeing spots". As I walking to 5W, I asked that nurses start a NS 250cc bolus. MD was notified by staff. When I arrived, patient was flat in bed, cool to touch, diaphoretic, and skin color- pale. HR in the 70s, SBP in the 60s via automatic cuff. 100% on 4L  - which was weaned off. Lung sounds - clear throughout, good air movement, +1 edema in all extremities, mild JVD. Per notes, current EF ~ 15%. Not in acute distress but symptomatic of hypotension. MD came to bedside as well.  Interventions: -- NS 250 cc bolus x 1 -- Solu Cortef 100 mg Q8H x 3 doses  Plan of Care:  -- Monitor BP/HR frequently -- Hold BP/HR per MD as ordered -- Monitor for signs of shock/low perfusion -- Strict I/O  Event Summary:  Call Time 0708 Arrival Time 0712 End Time 0745  Ashley [name], [place]

## 2018-10-16 ENCOUNTER — Encounter (HOSPITAL_COMMUNITY)
Admission: EM | Disposition: A | Discharge status: Home / Self Care | Payer: Self-pay | Source: Home / Self Care | Attending: Internal Medicine

## 2018-10-16 ENCOUNTER — Other Ambulatory Visit: Payer: Self-pay

## 2018-10-16 DIAGNOSIS — I5023 Acute on chronic systolic (congestive) heart failure: Secondary | ICD-10-CM

## 2018-10-16 DIAGNOSIS — I509 Heart failure, unspecified: Secondary | ICD-10-CM

## 2018-10-16 HISTORY — PX: RIGHT/LEFT HEART CATH AND CORONARY ANGIOGRAPHY: CATH118266

## 2018-10-16 LAB — POCT I-STAT 7, (LYTES, BLD GAS, ICA,H+H)
Acid-Base Excess: 3 mmol/L — ABNORMAL HIGH (ref 0.0–2.0)
Bicarbonate: 27.6 mmol/L (ref 20.0–28.0)
Calcium, Ion: 1.23 mmol/L (ref 1.15–1.40)
HCT: 37 % (ref 36.0–46.0)
Hemoglobin: 12.6 g/dL (ref 12.0–15.0)
O2 Saturation: 95 %
Potassium: 3.2 mmol/L — ABNORMAL LOW (ref 3.5–5.1)
Sodium: 140 mmol/L (ref 135–145)
TCO2: 29 mmol/L (ref 22–32)
pCO2 arterial: 40.1 mmHg (ref 32.0–48.0)
pH, Arterial: 7.447 (ref 7.350–7.450)
pO2, Arterial: 72 mmHg — ABNORMAL LOW (ref 83.0–108.0)

## 2018-10-16 LAB — CARDIAC CATHETERIZATION: Cath EF Quantitative: 25 %

## 2018-10-16 LAB — BASIC METABOLIC PANEL
Anion gap: 17 — ABNORMAL HIGH (ref 5–15)
BUN: 33 mg/dL — ABNORMAL HIGH (ref 8–23)
CO2: 21 mmol/L — ABNORMAL LOW (ref 22–32)
Calcium: 9.6 mg/dL (ref 8.9–10.3)
Chloride: 98 mmol/L (ref 98–111)
Creatinine, Ser: 1.2 mg/dL — ABNORMAL HIGH (ref 0.44–1.00)
GFR calc Af Amer: 52 mL/min — ABNORMAL LOW (ref >60)
GFR calc non Af Amer: 45 mL/min — ABNORMAL LOW (ref >60)
Glucose, Bld: 133 mg/dL — ABNORMAL HIGH (ref 70–99)
Potassium: 4.1 mmol/L (ref 3.5–5.1)
Sodium: 136 mmol/L (ref 135–145)

## 2018-10-16 LAB — POCT I-STAT EG7
Acid-Base Excess: 1 mmol/L (ref 0.0–2.0)
Bicarbonate: 26.2 mmol/L (ref 20.0–28.0)
Calcium, Ion: 1.19 mmol/L (ref 1.15–1.40)
HCT: 37 % (ref 36.0–46.0)
Hemoglobin: 12.6 g/dL (ref 12.0–15.0)
O2 Saturation: 63 %
Potassium: 3.1 mmol/L — ABNORMAL LOW (ref 3.5–5.1)
Sodium: 136 mmol/L (ref 135–145)
TCO2: 27 mmol/L (ref 22–32)
pCO2, Ven: 41.3 mmHg — ABNORMAL LOW (ref 44.0–60.0)
pH, Ven: 7.411 (ref 7.250–7.430)
pO2, Ven: 33 mmHg (ref 32.0–45.0)

## 2018-10-16 SURGERY — RIGHT/LEFT HEART CATH AND CORONARY ANGIOGRAPHY
Anesthesia: LOCAL

## 2018-10-16 MED ORDER — SODIUM CHLORIDE 0.9% FLUSH
3.0000 mL | Freq: Two times a day (BID) | INTRAVENOUS | Status: DC
  Administered 2018-10-17 (×2): 3 mL via INTRAVENOUS

## 2018-10-16 MED ORDER — SODIUM CHLORIDE 0.9% FLUSH: 3.0000 mL | Freq: Two times a day (BID) | INTRAVENOUS | Status: DC

## 2018-10-16 MED ORDER — LIDOCAINE HCL (PF) 1 % IJ SOLN
INTRAMUSCULAR | Status: DC | PRN
  Administered 2018-10-16 (×2): 2 mL via INTRADERMAL

## 2018-10-16 MED ORDER — SODIUM CHLORIDE 0.9 % IV SOLN: 250.0000 mL | INTRAVENOUS | Status: DC | PRN

## 2018-10-16 MED ORDER — LIDOCAINE HCL (PF) 1 % IJ SOLN
INTRAMUSCULAR | Status: AC
  Filled 2018-10-16: qty 30

## 2018-10-16 MED ORDER — HEPARIN (PORCINE) IN NACL 1000-0.9 UT/500ML-% IV SOLN
INTRAVENOUS | Status: AC
  Filled 2018-10-16: qty 1000

## 2018-10-16 MED ORDER — HEPARIN SODIUM (PORCINE) 1000 UNIT/ML IJ SOLN
INTRAMUSCULAR | Status: DC | PRN
  Administered 2018-10-16: 4000 [IU] via INTRAVENOUS

## 2018-10-16 MED ORDER — ASPIRIN 81 MG PO CHEW
81.0000 mg | CHEWABLE_TABLET | ORAL | Status: AC
  Administered 2018-10-16: 81 mg via ORAL
  Filled 2018-10-16: qty 1

## 2018-10-16 MED ORDER — IOHEXOL 350 MG/ML SOLN
INTRAVENOUS | Status: DC | PRN
  Administered 2018-10-16: 70 mL via INTRACARDIAC

## 2018-10-16 MED ORDER — DIPHENHYDRAMINE HCL 50 MG/ML IJ SOLN
INTRAMUSCULAR | Status: AC
  Filled 2018-10-16: qty 1

## 2018-10-16 MED ORDER — HEPARIN (PORCINE) IN NACL 1000-0.9 UT/500ML-% IV SOLN
INTRAVENOUS | Status: DC | PRN
  Administered 2018-10-16 (×2): 500 mL

## 2018-10-16 MED ORDER — DIPHENHYDRAMINE HCL 50 MG/ML IJ SOLN
INTRAMUSCULAR | Status: DC | PRN
  Administered 2018-10-16: 25 mg via INTRAVENOUS

## 2018-10-16 MED ORDER — VERAPAMIL HCL 2.5 MG/ML IV SOLN
INTRAVENOUS | Status: DC | PRN
  Administered 2018-10-16: 10 mL via INTRA_ARTERIAL

## 2018-10-16 MED ORDER — SODIUM CHLORIDE 0.9% FLUSH: 3.0000 mL | INTRAVENOUS | Status: DC | PRN

## 2018-10-16 MED ORDER — VERAPAMIL HCL 2.5 MG/ML IV SOLN
INTRAVENOUS | Status: AC
  Filled 2018-10-16: qty 2

## 2018-10-16 MED ORDER — SODIUM CHLORIDE 0.9 % IV SOLN: INTRAVENOUS | Status: DC

## 2018-10-16 MED ORDER — MIDAZOLAM HCL 2 MG/2ML IJ SOLN
INTRAMUSCULAR | Status: DC | PRN
  Administered 2018-10-16: 0.5 mg via INTRAVENOUS
  Administered 2018-10-16: 1 mg via INTRAVENOUS

## 2018-10-16 MED ORDER — MIDAZOLAM HCL 2 MG/2ML IJ SOLN
INTRAMUSCULAR | Status: AC
  Filled 2018-10-16: qty 2

## 2018-10-16 SURGICAL SUPPLY — 13 items
CATH BALLN WEDGE 5F 110CM (CATHETERS) ×2 IMPLANT
CATH INFINITI 5FR JK (CATHETERS) ×2 IMPLANT
COVER DOME SNAP 22 D (MISCELLANEOUS) ×2 IMPLANT
DEVICE RAD COMP TR BAND LRG (VASCULAR PRODUCTS) ×2 IMPLANT
GLIDESHEATH SLEND SS 6F .021 (SHEATH) ×2 IMPLANT
GUIDEWIRE .025 260CM (WIRE) ×2 IMPLANT
GUIDEWIRE INQWIRE 1.5J.035X260 (WIRE) ×1 IMPLANT
INQWIRE 1.5J .035X260CM (WIRE) ×2
KIT HEART LEFT (KITS) ×2 IMPLANT
PACK CARDIAC CATHETERIZATION (CUSTOM PROCEDURE TRAY) ×2 IMPLANT
SHEATH GLIDE SLENDER 4/5FR (SHEATH) ×2 IMPLANT
TRANSDUCER W/STOPCOCK (MISCELLANEOUS) ×2 IMPLANT
TUBING CIL FLEX 10 FLL-RA (TUBING) ×2 IMPLANT

## 2018-10-16 NOTE — Interval H&P Note (Signed)
History and Physical Interval Note:  10/16/2018 2:57 PM  Ashley [name]  has presented today for surgery, with the diagnosis of n stemi.  The various methods of treatment have been discussed with the patient and family. After consideration of risks, benefits and other options for treatment, the patient has consented to  Procedure(s): RIGHT/LEFT HEART CATH AND CORONARY ANGIOGRAPHY (N/A) as a surgical intervention.  The patient's history has been reviewed, patient examined, no change in status, stable for surgery.  I have reviewed the patient's chart and labs.  Questions were answered to the patient's satisfaction.     Kathlyn [place]

## 2018-10-16 NOTE — Progress Notes (Signed)
PROGRESS NOTE    [name]  TKZ:[id number] DOB: Mar 21, 1946 DOA: 10/14/2018 PCP: [name], MD    Brief Narrative:  73 year old female with medical history significant for obstructive sleep apnea, nonischemic cardiomyopathy with AICD, COPD, hypertension and hyperlipidemia; who presented to the emergency department secondary to chest discomfort and shortness of breath.  Recently treated as an outpatient for concern of COPD exacerbation with prednisone and antibiotic without significant improvement in her symptoms.  Elevated BNP and vascular congestion appreciated on chest x-ray.  Patient has been admitted for further evaluation and management of CHF exacerbation and chest discomfort.  Assessment & Plan:   Principal Problem:   Acute on chronic combined systolic and diastolic CHF (congestive heart failure) (HCC) Active Problems:   Dyslipidemia   Acute hypotension   CKD (chronic kidney disease) stage 3, GFR 30-59 ml/min (HCC)   ICD (implantable cardioverter-defibrillator) in place   Acute respiratory distress  1-Acute on chronic combined systolic and diastolic CHF (congestive heart failure) (HCC) -Continue low-sodium diet, daily weight and strict I's and O's -Patient on beta-blocker and ARB -Bedside echo in ED for cardiology service demonstrated once again decreasing her ejection fraction. -Cardiology following, planning heart cath today  2-acute symptomatic hypotension -In the setting of aggressive diuresis, improved with gentle IVF hydration -Hold diuretics and adjust dose with intention to resume diuresis in a.m. -Adjust dose of beta-blocker -Holding spironolactone and losartan. -Follow vital signs closely  3-mixed HLD -continue statins and zetia -Stable at present  4-CKD (chronic kidney disease) stage 3, GFR 30-59 ml/min (HCC) -Cr remains stable currently. Labs reviewed -will follow renal function trend   5-ICD (implantable cardioverter-defibrillator) in  place -appears to be working as intended -HR in the 70's -no firing activity  6-GERD -continue PPI -Stable currently  7-COPD -currently no wheezing  -continue PRN albuterol   DVT prophylaxis: Lovenox subQ Code Status: Full Family Communication: Pt in room, family not at bedside Disposition Plan: Uncertain at this time  Consultants:   Cardiology  Procedures:     Antimicrobials: Anti-infectives (From admission, onward)   None       Subjective: Complains of back/flank pain this AM  Objective: Vitals:   10/16/18 0414 10/16/18 0500 10/16/18 0610 10/16/18 0742  BP: (!) 89/44  (!) 94/58 90/71  Pulse: 77  83 80  Resp: 14  13 17   Temp:    97.8 F (36.6 C)  TempSrc:    Oral  SpO2: 92%  96% 95%  Weight:  81.7 kg      Intake/Output Summary (Last 24 hours) at 10/16/2018 1214 Last data filed at 10/16/2018 1100 Gross per 24 hour  Intake -  Output 1500 ml  Net -1500 ml   Filed Weights   10/15/18 0654 10/16/18 0500  Weight: 81.1 kg 81.7 kg    Examination:  General exam: Appears calm and comfortable, back tenderness on palpation up back to level of shoulder blades Respiratory system: Clear to auscultation. Respiratory effort normal. Cardiovascular system: S1 & S2 heard, RRR Gastrointestinal system: Abdomen is nondistended, soft and nontender. No organomegaly or masses felt. Normal bowel sounds heard. Central nervous system: Alert and oriented. No focal neurological deficits. Extremities: Symmetric 5 x 5 power. Skin: No rashes, lesions  Psychiatry: Judgement and insight appear normal. Mood & affect appropriate.   Data Reviewed: I have personally reviewed following labs and imaging studies  CBC: Recent Labs  Lab 10/14/18 1214 10/14/18 2339  WBC 16.4* 20.8*  NEUTROABS  --  12.6*  HGB 12.6 13.0  HCT 39.5 40.1  MCV 93.6 91.8  PLT 182 161   Basic Metabolic Panel: Recent Labs  Lab 10/14/18 1214 10/15/18 0643 10/16/18 0216  NA 139 139 136  K 3.8 3.6  4.1  CL 103 98 98  CO2 23 25 21*  GLUCOSE 100* 105* 133*  BUN 19 24* 33*  CREATININE 1.20* 1.25* 1.20*  CALCIUM 10.0 9.5 9.6   GFR: Estimated Creatinine Clearance: 44.1 mL/min (A) (by C-G formula based on SCr of 1.2 mg/dL (H)). Liver Function Tests: No results for input(s): AST, ALT, ALKPHOS, BILITOT, PROT, ALBUMIN in the last 168 hours. No results for input(s): LIPASE, AMYLASE in the last 168 hours. No results for input(s): AMMONIA in the last 168 hours. Coagulation Profile: No results for input(s): INR, PROTIME in the last 168 hours. Cardiac Enzymes: Recent Labs  Lab 10/14/18 1214 10/14/18 1950 10/14/18 2339 10/15/18 0643  TROPONINI <0.03 0.03* 0.04* 0.03*   BNP (last 3 results) No results for input(s): PROBNP in the last 8760 hours. HbA1C: No results for input(s): HGBA1C in the last 72 hours. CBG: Recent Labs  Lab 10/15/18 0718  GLUCAP 132*   Lipid Profile: No results for input(s): CHOL, HDL, LDLCALC, TRIG, CHOLHDL, LDLDIRECT in the last 72 hours. Thyroid Function Tests: No results for input(s): TSH, T4TOTAL, FREET4, T3FREE, THYROIDAB in the last 72 hours. Anemia Panel: No results for input(s): VITAMINB12, FOLATE, FERRITIN, TIBC, IRON, RETICCTPCT in the last 72 hours. Sepsis Labs: No results for input(s): PROCALCITON, LATICACIDVEN in the last 168 hours.  Recent Results (from the past 240 hour(s))  Novel Coronavirus, NAA ([facility])     Status: None   Collection Time: 10/09/18  2:58 PM  Result Value Ref Range Status   SARS-CoV-2, NAA Not Detected Not Detected Final    Comment: This test was developed and its performance characteristics determined by [employer]. This test has not been FDA cleared or approved. This test has been authorized by FDA under an Emergency Use Authorization (EUA). This test is only authorized for the duration of time the declaration that circumstances exist justifying the authorization of the emergency use of in vitro diagnostic  tests for detection of SARS-CoV-2 virus and/or diagnosis of COVID-19 infection under section 564(b)(1) of the Act, 21 U.S.C. 096EAV-4(U)(9), unless the authorization is terminated or revoked sooner. When diagnostic testing is negative, the possibility of a false negative result should be considered in the context of a patient's recent exposures and the presence of clinical signs and symptoms consistent with COVID-19. An individual without symptoms of COVID-19 and who is not shedding SARS-CoV-2 virus would expect to have a negative (not detected) result in this assay.   SARS Coronavirus 2 (CEPHEID - Performed in [place] hospital lab), Hosp Order     Status: None   Collection Time: 10/14/18 12:15 PM  Result Value Ref Range Status   SARS Coronavirus 2 NEGATIVE NEGATIVE Final    Comment: (NOTE) If result is NEGATIVE SARS-CoV-2 target nucleic acids are NOT DETECTED. The SARS-CoV-2 RNA is generally detectable in upper and lower  respiratory specimens during the acute phase of infection. The lowest  concentration of SARS-CoV-2 viral copies this assay can detect is 250  copies / mL. A negative result does not preclude SARS-CoV-2 infection  and should not be used as the sole basis for treatment or other  patient management decisions.  A negative result may occur with  improper specimen collection / handling, submission of specimen other  than nasopharyngeal swab, presence of viral  mutation(s) within the  areas targeted by this assay, and inadequate number of viral copies  (<250 copies / mL). A negative result must be combined with clinical  observations, patient history, and epidemiological information. If result is POSITIVE SARS-CoV-2 target nucleic acids are DETECTED. The SARS-CoV-2 RNA is generally detectable in upper and lower  respiratory specimens dur ing the acute phase of infection.  Positive  results are indicative of active infection with SARS-CoV-2.  Clinical  correlation  with patient history and other diagnostic information is  necessary to determine patient infection status.  Positive results do  not rule out bacterial infection or co-infection with other viruses. If result is PRESUMPTIVE POSTIVE SARS-CoV-2 nucleic acids MAY BE PRESENT.   A presumptive positive result was obtained on the submitted specimen  and confirmed on repeat testing.  While 2019 novel coronavirus  (SARS-CoV-2) nucleic acids may be present in the submitted sample  additional confirmatory testing may be necessary for epidemiological  and / or clinical management purposes  to differentiate between  SARS-CoV-2 and other Sarbecovirus currently known to infect humans.  If clinically indicated additional testing with an alternate test  methodology [phone] is advised. The SARS-CoV-2 RNA is generally  detectable in upper and lower respiratory sp ecimens during the acute  phase of infection. The expected result is Negative. Fact Sheet for Patients:  StrictlyIdeas.no Fact Sheet for Healthcare Providers: BankingDealers.co.za This test is not yet approved or cleared by the [place] FDA and has been authorized for detection and/or diagnosis of SARS-CoV-2 by FDA under an Emergency Use Authorization (EUA).  This EUA will remain in effect (meaning this test can be used) for the duration of the COVID-19 declaration under Section 564(b)(1) of the Act, 21 U.S.C. section 360bbb-3(b)(1), unless the authorization is terminated or revoked sooner. Performed at [facility], [place] [address], [place], [place] [address]   MRSA PCR Screening     Status: None   Collection Time: 10/14/18  7:59 PM  Result Value Ref Range Status   MRSA by PCR NEGATIVE NEGATIVE Final    Comment:        The GeneXpert MRSA Assay (FDA approved for NASAL specimens only), is one component of a comprehensive MRSA colonization surveillance program. It is not intended to  diagnose MRSA infection nor to guide or monitor treatment for MRSA infections. Performed at [facility], [place] [address] [place], [place] [address]      Radiology Studies: No results found.  Scheduled Meds: . aspirin  81 mg Oral Pre-Cath  . aspirin EC  81 mg Oral QHS  . carvedilol  3.125 mg Oral BID WC  . enoxaparin (LOVENOX) injection  40 mg Subcutaneous Q24H  . ezetimibe  10 mg Oral QHS  . furosemide  20 mg Intravenous Q12H  . guaiFENesin  600 mg Oral Daily  . omega-3 acid ethyl esters  1 g Oral Weekly  . sodium chloride flush  3 mL Intravenous Q12H  . traMADol  50 mg Oral Once   Continuous Infusions: . sodium chloride    . sodium chloride       LOS: 2 days   Marylu Lund, MD [facility] Pager On Amion  If 7PM-7AM, please contact night-coverage 10/16/2018, 12:14 PM

## 2018-10-16 NOTE — Progress Notes (Signed)
Pt c/o new soreness above kidneys in R mid back. Pt c/o 6/10 pain. PRN tylenol given. Pt stated that she is concerned about her kidney function and does not want to take lasix dose until labs are drawn and kidney function is checked. Will hold 0000 dose. Vital signs remain stable. NP Baltazar Najjar paged and made aware. Will continue to monitor.

## 2018-10-16 NOTE — Progress Notes (Addendum)
Progress Note  Patient Name: Ashley [name] Date of Encounter: 10/16/2018  Primary Cardiologist: [name], MD  Primary Electrophysiologist: Cristopher Peru, MD  Subjective   Feels much better today. No current dyspnea. She only gets SOB if she has a coughing fit. No CP. LEE has improved.   Inpatient Medications    Scheduled Meds: . aspirin EC  81 mg Oral QHS  . carvedilol  3.125 mg Oral BID WC  . enoxaparin (LOVENOX) injection  40 mg Subcutaneous Q24H  . ezetimibe  10 mg Oral QHS  . furosemide  20 mg Intravenous Q12H  . guaiFENesin  600 mg Oral Daily  . omega-3 acid ethyl esters  1 g Oral Weekly  . sodium chloride flush  3 mL Intravenous Q12H  . traMADol  50 mg Oral Once   Continuous Infusions: . sodium chloride     PRN Meds: sodium chloride, acetaminophen, albuterol, dextromethorphan, ondansetron (ZOFRAN) IV, sodium chloride flush   Vital Signs    Vitals:   10/16/18 0414 10/16/18 0500 10/16/18 0610 10/16/18 0742  BP: (!) 89/44  (!) 94/58 90/71  Pulse: 77  83 80  Resp: 14  13 17   Temp:    97.8 F (36.6 C)  TempSrc:    Oral  SpO2: 92%  96% 95%  Weight:  81.7 kg      Intake/Output Summary (Last 24 hours) at 10/16/2018 0920 Last data filed at 10/15/2018 2332 Gross per 24 hour  Intake -  Output 1000 ml  Net -1000 ml   Last 3 Weights 10/16/2018 10/15/2018 10/09/2018  Weight (lbs) 180 lb 1.9 oz 178 lb 12.7 oz 177 lb 7 oz  Weight (kg) 81.7 kg 81.1 kg 80.485 kg      Telemetry    NSR 70s - Personally Reviewed  ECG    Not performed today - Personally Reviewed  Physical Exam   GEN: No acute distress.   Neck: No JVD Cardiac: RRR, no murmurs, rubs, or gallops.  Respiratory: Clear to auscultation bilaterally. GI: Soft, nontender, non-distended  MS: No edema; No deformity. Neuro:  Nonfocal  Psych: Normal affect   Labs    Chemistry Recent Labs  Lab 10/14/18 1214 10/15/18 0643 10/16/18 0216  NA 139 139 136  K 3.8 3.6 4.1  CL 103 98 98  CO2 23 25  21*  GLUCOSE 100* 105* 133*  BUN 19 24* 33*  CREATININE 1.20* 1.25* 1.20*  CALCIUM 10.0 9.5 9.6  GFRNONAA 45* 43* 45*  GFRAA 52* 49* 52*  ANIONGAP 13 16* 17*     Hematology Recent Labs  Lab 10/14/18 1214 10/14/18 2339  WBC 16.4* 20.8*  RBC 4.22 4.37  HGB 12.6 13.0  HCT 39.5 40.1  MCV 93.6 91.8  MCH 29.9 29.7  MCHC 31.9 32.4  RDW 12.7 12.8  PLT 182 196    Cardiac Enzymes Recent Labs  Lab 10/14/18 1214 10/14/18 1950 10/14/18 2339 10/15/18 0643  TROPONINI <0.03 0.03* 0.04* 0.03*   No results for input(s): TROPIPOC in the last 168 hours.   BNP Recent Labs  Lab 10/14/18 1214  BNP 774.4*     DDimer No results for input(s): DDIMER in the last 168 hours.   Radiology    Dg Chest 2 View  Result Date: 10/14/2018 CLINICAL DATA:  Worsening chest pain and shortness of breath. EXAM: CHEST - 2 VIEW COMPARISON:  Chest x-ray dated December 29, 2017. FINDINGS: Unchanged left chest wall pacemaker. Stable borderline cardiomegaly. Normal mediastinal contours. Pulmonary vascular congestion with  mild interstitial thickening. No focal consolidation, pleural effusion, or pneumothorax. No acute osseous abnormality. IMPRESSION: 1. Mild interstitial pulmonary edema. Electronically Signed   By: Titus Dubin M.D.   On: 10/14/2018 11:07    Cardiac Studies   2D Echo 10/14/18 IMPRESSIONS    1. The left ventricle has a 2D calculated ejection fraction 15-20%%. The cavity size was mildly dilated. Left ventricular diastolic Doppler parameters are indeterminate.  2. The right ventricle has normal systolic function. The cavity was normal. There is no increase in right ventricular wall thickness.  3. Left atrial size was moderately dilated.  4. The mitral valve is rheumatic. Moderate thickening of the mitral valve leaflet. There is mild to moderate mitral annular calcification present. Mitral valve regurgitation is severe by color flow Doppler. The MR jet is centrally-directed.  5. The aortic  valve is tricuspid. Mild calcification of the aortic valve.  6. The aortic root and ascending aorta are normal in size and structure.  7. The interatrial septum was not well visualized.  Patient Profile     73 y.o. female with PMH of NICM s/p ICD (EF improved to 60-65% by echo 12/2017), HTN and COPD who presented with CP and dyspnea. EF down to 15% again.   Assessment & Plan    1. Acute Systolic CHF: feels better after getting lasix. Currently no SOB. LEE improved. Net I/Os -1.6 L since admit. Getting RHC today. Measurements will help to guide further diuresis.   2. NICM s/p ICD: on coreg, losartan and spironolactone as outpatient. Could not afford entresto in the past.  Per patient, she had bronchitis 2 weeks ago. She says previous low pumping function was also preceded by viral symptoms. No h/o cath. Will plan on R/LHC to exclude underlying CAD.   3. 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 Plan R/LHC today.   4. HTN: last BP reading this morning was soft at 90/71 but may not be accurate as she had other BP reading recorded at 37/25. No symptoms of hypotension. Has been getting Coreg and lasix. No issues.   5. COPD: breathing improved. Management per primary.   For questions or updates, please contact [place] Please consult [email or web address] for contact info under        Signed, Lyda Jester, PA-C  10/16/2018, 9:20 AM     Attending Note:   The patient was seen and examined.  Agree with assessment and plan as noted above.  Changes made to the above note as needed.  Patient seen and independently examined with  Lyda Jester, PA .   We discussed all aspects of the encounter. I agree with the assessment and plan as stated above.  1.  Acute systolic CHF:  For R and L heart cath today .  Breathing is better after diuresis .   2.   MR:   ? Partially functional,  Appears to be  worse when she is volume overloaded.   3.  HTN:   Continue meds.    I have spent a total of 40 minutes with patient reviewing hospital  notes , telemetry, EKGs, labs and examining patient as well as establishing an assessment and plan that was discussed with the patient. > 50% of time was spent in direct patient care.   Thayer Headings, Brooke Bonito., MD, [facility] 10/16/2018, 10:56 AM [address]  [place] Pager [phone]

## 2018-10-16 NOTE — Progress Notes (Signed)
TR BAND REMOVAL  LOCATION:    Radial rt radial  DEFLATED PER PROTOCOL:   yes  TIME BAND OFF / DRESSING APPLIED:    1745/gauze and tegaderm  SITE UPON ARRIVAL:    Level 0  SITE AFTER BAND REMOVAL:    Level 0  CIRCULATION SENSATION AND MOVEMENT:    Within Normal Limits :  Yes, rt hand and fingers warm and pink, sensation present  COMMENTS:   Instructions reviewed w/patient

## 2018-10-16 NOTE — H&P (View-Only) (Signed)
Progress Note  Patient Name: ARNEISHA KINCANNON Date of Encounter: 10/16/2018  Primary Cardiologist: [name], MD  Primary Electrophysiologist: Cristopher Peru, MD  Subjective   Feels much better today. No current dyspnea. She only gets SOB if she has a coughing fit. No CP. LEE has improved.   Inpatient Medications    Scheduled Meds: . aspirin EC  81 mg Oral QHS  . carvedilol  3.125 mg Oral BID WC  . enoxaparin (LOVENOX) injection  40 mg Subcutaneous Q24H  . ezetimibe  10 mg Oral QHS  . furosemide  20 mg Intravenous Q12H  . guaiFENesin  600 mg Oral Daily  . omega-3 acid ethyl esters  1 g Oral Weekly  . sodium chloride flush  3 mL Intravenous Q12H  . traMADol  50 mg Oral Once   Continuous Infusions: . sodium chloride     PRN Meds: sodium chloride, acetaminophen, albuterol, dextromethorphan, ondansetron (ZOFRAN) IV, sodium chloride flush   Vital Signs    Vitals:   10/16/18 0414 10/16/18 0500 10/16/18 0610 10/16/18 0742  BP: (!) 89/44  (!) 94/58 90/71  Pulse: 77  83 80  Resp: 14  13 17   Temp:    97.8 F (36.6 C)  TempSrc:    Oral  SpO2: 92%  96% 95%  Weight:  81.7 kg      Intake/Output Summary (Last 24 hours) at 10/16/2018 0920 Last data filed at 10/15/2018 2332 Gross per 24 hour  Intake -  Output 1000 ml  Net -1000 ml   Last 3 Weights 10/16/2018 10/15/2018 10/09/2018  Weight (lbs) 180 lb 1.9 oz 178 lb 12.7 oz 177 lb 7 oz  Weight (kg) 81.7 kg 81.1 kg 80.485 kg      Telemetry    NSR 70s - Personally Reviewed  ECG    Not performed today - Personally Reviewed  Physical Exam   GEN: No acute distress.   Neck: No JVD Cardiac: RRR, no murmurs, rubs, or gallops.  Respiratory: Clear to auscultation bilaterally. GI: Soft, nontender, non-distended  MS: No edema; No deformity. Neuro:  Nonfocal  Psych: Normal affect   Labs    Chemistry Recent Labs  Lab 10/14/18 1214 10/15/18 0643 10/16/18 0216  NA 139 139 136  K 3.8 3.6 4.1  CL 103 98 98  CO2 23 25  21*  GLUCOSE 100* 105* 133*  BUN 19 24* 33*  CREATININE 1.20* 1.25* 1.20*  CALCIUM 10.0 9.5 9.6  GFRNONAA 45* 43* 45*  GFRAA 52* 49* 52*  ANIONGAP 13 16* 17*     Hematology Recent Labs  Lab 10/14/18 1214 10/14/18 2339  WBC 16.4* 20.8*  RBC 4.22 4.37  HGB 12.6 13.0  HCT 39.5 40.1  MCV 93.6 91.8  MCH 29.9 29.7  MCHC 31.9 32.4  RDW 12.7 12.8  PLT 182 196    Cardiac Enzymes Recent Labs  Lab 10/14/18 1214 10/14/18 1950 10/14/18 2339 10/15/18 0643  TROPONINI <0.03 0.03* 0.04* 0.03*   No results for input(s): TROPIPOC in the last 168 hours.   BNP Recent Labs  Lab 10/14/18 1214  BNP 774.4*     DDimer No results for input(s): DDIMER in the last 168 hours.   Radiology    Dg Chest 2 View  Result Date: 10/14/2018 CLINICAL DATA:  Worsening chest pain and shortness of breath. EXAM: CHEST - 2 VIEW COMPARISON:  Chest x-ray dated December 29, 2017. FINDINGS: Unchanged left chest wall pacemaker. Stable borderline cardiomegaly. Normal mediastinal contours. Pulmonary vascular congestion with  mild interstitial thickening. No focal consolidation, pleural effusion, or pneumothorax. No acute osseous abnormality. IMPRESSION: 1. Mild interstitial pulmonary edema. Electronically Signed   By: Titus Dubin M.D.   On: 10/14/2018 11:07    Cardiac Studies   2D Echo 10/14/18 IMPRESSIONS    1. The left ventricle has a 2D calculated ejection fraction 15-20%%. The cavity size was mildly dilated. Left ventricular diastolic Doppler parameters are indeterminate.  2. The right ventricle has normal systolic function. The cavity was normal. There is no increase in right ventricular wall thickness.  3. Left atrial size was moderately dilated.  4. The mitral valve is rheumatic. Moderate thickening of the mitral valve leaflet. There is mild to moderate mitral annular calcification present. Mitral valve regurgitation is severe by color flow Doppler. The MR jet is centrally-directed.  5. The aortic  valve is tricuspid. Mild calcification of the aortic valve.  6. The aortic root and ascending aorta are normal in size and structure.  7. The interatrial septum was not well visualized.  Patient Profile     73 y.o. female with PMH of NICM s/p ICD (EF improved to 60-65% by echo 12/2017), HTN and COPD who presented with CP and dyspnea. EF down to 15% again.   Assessment & Plan    1. Acute Systolic CHF: feels better after getting lasix. Currently no SOB. LEE improved. Net I/Os -1.6 L since admit. Getting RHC today. Measurements will help to guide further diuresis.   2. NICM s/p ICD: on coreg, losartan and spironolactone as outpatient. Could not afford entresto in the past.  Per patient, she had bronchitis 2 weeks ago. She says previous low pumping function was also preceded by viral symptoms. No h/o cath. Will plan on R/LHC to exclude underlying CAD.   3. 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 Plan R/LHC today.   4. HTN: last BP reading this morning was soft at 90/71 but may not be accurate as she had other BP reading recorded at 37/25. No symptoms of hypotension. Has been getting Coreg and lasix. No issues.   5. COPD: breathing improved. Management per primary.   For questions or updates, please contact [place] Please consult [email or web address] for contact info under        Signed, Lyda Jester, PA-C  10/16/2018, 9:20 AM     Attending Note:   The patient was seen and examined.  Agree with assessment and plan as noted above.  Changes made to the above note as needed.  Patient seen and independently examined with  Lyda Jester, PA .   We discussed all aspects of the encounter. I agree with the assessment and plan as stated above.  1.  Acute systolic CHF:  For R and L heart cath today .  Breathing is better after diuresis .   2.   MR:   ? Partially functional,  Appears to be  worse when she is volume overloaded.   3.  HTN:   Continue meds.    I have spent a total of 40 minutes with patient reviewing hospital  notes , telemetry, EKGs, labs and examining patient as well as establishing an assessment and plan that was discussed with the patient. > 50% of time was spent in direct patient care.   Thayer Headings, Brooke Bonito., MD, [facility] 10/16/2018, 10:56 AM [address],  [place] Pager [phone]

## 2018-10-17 ENCOUNTER — Encounter (HOSPITAL_COMMUNITY): Payer: Self-pay | Admitting: Cardiovascular Disease

## 2018-10-17 DIAGNOSIS — R0603 Acute respiratory distress: Secondary | ICD-10-CM

## 2018-10-17 LAB — BASIC METABOLIC PANEL
Anion gap: 13 (ref 5–15)
BUN: 29 mg/dL — ABNORMAL HIGH (ref 8–23)
CO2: 25 mmol/L (ref 22–32)
Calcium: 9.3 mg/dL (ref 8.9–10.3)
Chloride: 101 mmol/L (ref 98–111)
Creatinine, Ser: 1.11 mg/dL — ABNORMAL HIGH (ref 0.44–1.00)
GFR calc Af Amer: 57 mL/min — ABNORMAL LOW (ref >60)
GFR calc non Af Amer: 49 mL/min — ABNORMAL LOW (ref >60)
Glucose, Bld: 95 mg/dL (ref 70–99)
Potassium: 3.7 mmol/L (ref 3.5–5.1)
Sodium: 139 mmol/L (ref 135–145)

## 2018-10-17 MED ORDER — ORAL CARE MOUTH RINSE
15.0000 mL | Freq: Two times a day (BID) | OROMUCOSAL | Status: DC
  Administered 2018-10-18 (×2): 15 mL via OROMUCOSAL

## 2018-10-17 MED ORDER — DEXTROMETHORPHAN POLISTIREX ER 30 MG/5ML PO SUER
30.0000 mg | Freq: Once | ORAL | Status: AC
  Administered 2018-10-17: 30 mg via ORAL
  Filled 2018-10-17: qty 5

## 2018-10-17 NOTE — Progress Notes (Signed)
Patient had a 15 beat run of sustained V tach. Notified on call triad hospitalist. Patient also expresses concerns about taking lasix due to her BP.  Report given to Ronalee Belts, [place].

## 2018-10-17 NOTE — Progress Notes (Signed)
PROGRESS NOTE    Ashley [name]  ATF:[id number] DOB: 10/17/1945 DOA: 10/14/2018 PCP: [name], MD    Brief Narrative:  73 year old female with medical history significant for obstructive sleep apnea, nonischemic cardiomyopathy with AICD, COPD, hypertension and hyperlipidemia; who presented to the emergency department secondary to chest discomfort and shortness of breath.  Recently treated as an outpatient for concern of COPD exacerbation with prednisone and antibiotic without significant improvement in her symptoms.  Elevated BNP and vascular congestion appreciated on chest x-ray.  Patient has been admitted for further evaluation and management of CHF exacerbation and chest discomfort.  Assessment & Plan:   Principal Problem:   Acute on chronic combined systolic and diastolic CHF (congestive heart failure) (HCC) Active Problems:   Dyslipidemia   Acute hypotension   CKD (chronic kidney disease) stage 3, GFR 30-59 ml/min (HCC)   ICD (implantable cardioverter-defibrillator) in place   Acute respiratory distress  1-Acute on chronic combined systolic and diastolic CHF (congestive heart failure) (HCC) -Continue low-sodium diet, daily weight and strict I's and O's -Patient on beta-blocker and ARB -Bedside echo in ED for cardiology service demonstrated once again decreasing her ejection fraction. -Cardiology following, s/p R heart cath 6/3 with findings concerning for continued vol overload -Discussed with Cardiology who recommends eval by Heart Failure Team  2-acute symptomatic hypotension -In the setting of aggressive diuresis, improved with gentle IVF hydration -Adjust dose of beta-blocker -Holding spironolactone and losartan. -Follow vital signs closely. Cont to hold meds as needed for hypotension  3-mixed HLD -continue statins and zetia -Remains stable at this time  4-CKD (chronic kidney disease) stage 3, GFR 30-59 ml/min (HCC) -Cr remains stable currently. Labs  reviewed -will follow renal function trend   5-ICD (implantable cardioverter-defibrillator) in place -appears to be working as intended -HR stable at this time  6-GERD -continue PPI -Presently stable  7-COPD -currently no wheezing  -continue PRN albuterol as needed  DVT prophylaxis: Lovenox subQ Code Status: Full Family Communication: Pt in room, family not at bedside Disposition Plan: Uncertain at this time  Consultants:   Cardiology  Procedures:     Antimicrobials: Anti-infectives (From admission, onward)   None      Subjective: Without complaints at this time  Objective: Vitals:   10/17/18 0741 10/17/18 0835 10/17/18 1135 10/17/18 1245  BP:  (!) 95/45  104/60  Pulse:  79  88  Resp:  12  12  Temp:  98 F (36.7 C)  98.2 F (36.8 C)  TempSrc:  Oral  Oral  SpO2:  95%  96%  Weight: 82.7 kg  80 kg   Height:        Intake/Output Summary (Last 24 hours) at 10/17/2018 1526 Last data filed at 10/17/2018 0900 Gross per 24 hour  Intake 363 ml  Output 175 ml  Net 188 ml   Filed Weights   10/16/18 0500 10/17/18 0741 10/17/18 1135  Weight: 81.7 kg 82.7 kg 80 kg    Examination: General exam: Awake, laying in bed, in nad Respiratory system: Normal respiratory effort, no wheezing Cardiovascular system: regular rate, s1, s2 Gastrointestinal system: Soft, nondistended, positive BS Central nervous system: CN2-12 grossly intact, strength intact Extremities: Perfused, no clubbing Skin: Normal skin turgor, no notable skin lesions seen Psychiatry: Mood normal // no visual hallucinations   Data Reviewed: I have personally reviewed following labs and imaging studies  CBC: Recent Labs  Lab 10/14/18 1214 10/14/18 2339 10/16/18 1528 10/16/18 1529  WBC 16.4* 20.8*  --   --  NEUTROABS  --  12.6*  --   --   HGB 12.6 13.0 12.6 12.6  HCT 39.5 40.1 37.0 37.0  MCV 93.6 91.8  --   --   PLT 182 196  --   --    Basic Metabolic Panel: Recent Labs  Lab 10/14/18  1214 10/15/18 0643 10/16/18 0216 10/16/18 1528 10/16/18 1529 10/17/18 0320  NA 139 139 136 140 136 139  K 3.8 3.6 4.1 3.2* 3.1* 3.7  CL 103 98 98  --   --  101  CO2 23 25 21*  --   --  25  GLUCOSE 100* 105* 133*  --   --  95  BUN 19 24* 33*  --   --  29*  CREATININE 1.20* 1.25* 1.20*  --   --  1.11*  CALCIUM 10.0 9.5 9.6  --   --  9.3   GFR: Estimated Creatinine Clearance: 47.2 mL/min (A) (by C-G formula based on SCr of 1.11 mg/dL (H)). Liver Function Tests: No results for input(s): AST, ALT, ALKPHOS, BILITOT, PROT, ALBUMIN in the last 168 hours. No results for input(s): LIPASE, AMYLASE in the last 168 hours. No results for input(s): AMMONIA in the last 168 hours. Coagulation Profile: No results for input(s): INR, PROTIME in the last 168 hours. Cardiac Enzymes: Recent Labs  Lab 10/14/18 1214 10/14/18 1950 10/14/18 2339 10/15/18 0643  TROPONINI <0.03 0.03* 0.04* 0.03*   BNP (last 3 results) No results for input(s): PROBNP in the last 8760 hours. HbA1C: No results for input(s): HGBA1C in the last 72 hours. CBG: Recent Labs  Lab 10/15/18 0718  GLUCAP 132*   Lipid Profile: No results for input(s): CHOL, HDL, LDLCALC, TRIG, CHOLHDL, LDLDIRECT in the last 72 hours. Thyroid Function Tests: No results for input(s): TSH, T4TOTAL, FREET4, T3FREE, THYROIDAB in the last 72 hours. Anemia Panel: No results for input(s): VITAMINB12, FOLATE, FERRITIN, TIBC, IRON, RETICCTPCT in the last 72 hours. Sepsis Labs: No results for input(s): PROCALCITON, LATICACIDVEN in the last 168 hours.  Recent Results (from the past 240 hour(s))  Novel Coronavirus, NAA ([facility])     Status: None   Collection Time: 10/09/18  2:58 PM  Result Value Ref Range Status   SARS-CoV-2, NAA Not Detected Not Detected Final    Comment: This test was developed and its performance characteristics determined by [employer]. This test has not been FDA cleared or approved. This test has been authorized  by FDA under an Emergency Use Authorization (EUA). This test is only authorized for the duration of time the declaration that circumstances exist justifying the authorization of the emergency use of in vitro diagnostic tests for detection of SARS-CoV-2 virus and/or diagnosis of COVID-19 infection under section 564(b)(1) of the Act, 21 U.S.C. 562ZHY-8(M)(5), unless the authorization is terminated or revoked sooner. When diagnostic testing is negative, the possibility of a false negative result should be considered in the context of a patient's recent exposures and the presence of clinical signs and symptoms consistent with COVID-19. An individual without symptoms of COVID-19 and who is not shedding SARS-CoV-2 virus would expect to have a negative (not detected) result in this assay.   SARS Coronavirus 2 (CEPHEID - Performed in [place] hospital lab), Hosp Order     Status: None   Collection Time: 10/14/18 12:15 PM  Result Value Ref Range Status   SARS Coronavirus 2 NEGATIVE NEGATIVE Final    Comment: (NOTE) If result is NEGATIVE SARS-CoV-2 target nucleic acids are NOT DETECTED. The  SARS-CoV-2 RNA is generally detectable in upper and lower  respiratory specimens during the acute phase of infection. The lowest  concentration of SARS-CoV-2 viral copies this assay can detect is 250  copies / mL. A negative result does not preclude SARS-CoV-2 infection  and should not be used as the sole basis for treatment or other  patient management decisions.  A negative result may occur with  improper specimen collection / handling, submission of specimen other  than nasopharyngeal swab, presence of viral mutation(s) within the  areas targeted by this assay, and inadequate number of viral copies  (<250 copies / mL). A negative result must be combined with clinical  observations, patient history, and epidemiological information. If result is POSITIVE SARS-CoV-2 target nucleic acids are DETECTED.  The SARS-CoV-2 RNA is generally detectable in upper and lower  respiratory specimens dur ing the acute phase of infection.  Positive  results are indicative of active infection with SARS-CoV-2.  Clinical  correlation with patient history and other diagnostic information is  necessary to determine patient infection status.  Positive results do  not rule out bacterial infection or co-infection with other viruses. If result is PRESUMPTIVE POSTIVE SARS-CoV-2 nucleic acids MAY BE PRESENT.   A presumptive positive result was obtained on the submitted specimen  and confirmed on repeat testing.  While 2019 novel coronavirus  (SARS-CoV-2) nucleic acids may be present in the submitted sample  additional confirmatory testing may be necessary for epidemiological  and / or clinical management purposes  to differentiate between  SARS-CoV-2 and other Sarbecovirus currently known to infect humans.  If clinically indicated additional testing with an alternate test  methodology [phone]) is advised. The SARS-CoV-2 RNA is generally  detectable in upper and lower respiratory sp ecimens during the acute  phase of infection. The expected result is Negative. Fact Sheet for Patients:  StrictlyIdeas.no Fact Sheet for Healthcare Providers: BankingDealers.co.za This test is not yet approved or cleared by the [place] FDA and has been authorized for detection and/or diagnosis of SARS-CoV-2 by FDA under an Emergency Use Authorization (EUA).  This EUA will remain in effect (meaning this test can be used) for the duration of the COVID-19 declaration under Section 564(b)(1) of the Act, 21 U.S.C. section 360bbb-3(b)(1), unless the authorization is terminated or revoked sooner. Performed at [facility], [place] [address], [place], [place] [address]   MRSA PCR Screening     Status: None   Collection Time: 10/14/18  7:59 PM  Result Value Ref Range Status    MRSA by PCR NEGATIVE NEGATIVE Final    Comment:        The GeneXpert MRSA Assay (FDA approved for NASAL specimens only), is one component of a comprehensive MRSA colonization surveillance program. It is not intended to diagnose MRSA infection nor to guide or monitor treatment for MRSA infections. Performed at [facility], [place] [address], [place], [place] [address]      Radiology Studies: No results found.  Scheduled Meds: . aspirin EC  81 mg Oral QHS  . carvedilol  3.125 mg Oral BID WC  . enoxaparin (LOVENOX) injection  40 mg Subcutaneous Q24H  . ezetimibe  10 mg Oral QHS  . furosemide  20 mg Intravenous Q12H  . guaiFENesin  600 mg Oral Daily  . [START ON 10/18/2018] mouth rinse  15 mL Mouth Rinse BID  . omega-3 acid ethyl esters  1 g Oral Weekly  . sodium chloride flush  3 mL Intravenous Q12H  .  sodium chloride flush  3 mL Intravenous Q12H  . sodium chloride flush  3 mL Intravenous Q12H  . traMADol  50 mg Oral Once   Continuous Infusions: . sodium chloride    . sodium chloride    . sodium chloride       LOS: 3 days   Marylu Lund, MD [facility] Pager On Amion  If 7PM-7AM, please contact night-coverage 10/17/2018, 3:26 PM

## 2018-10-17 NOTE — Progress Notes (Addendum)
Progress Note  Patient Name: Ashley [name] Date of Encounter: 10/17/2018  Primary Cardiologist: [name], MD   Subjective   Pt reports she had a rough night. Did not sleep well and was SOB and coughing. Reports her O2 sats dropped in the upper 80s. She required supplemental O2 via [place]. Feeling a bit better this morning, just tired/ sleepy. Radial access site is ok. No CP.   Inpatient Medications    Scheduled Meds: . aspirin EC  81 mg Oral QHS  . carvedilol  3.125 mg Oral BID WC  . enoxaparin (LOVENOX) injection  40 mg Subcutaneous Q24H  . ezetimibe  10 mg Oral QHS  . furosemide  20 mg Intravenous Q12H  . guaiFENesin  600 mg Oral Daily  . [START ON 10/18/2018] mouth rinse  15 mL Mouth Rinse BID  . omega-3 acid ethyl esters  1 g Oral Weekly  . sodium chloride flush  3 mL Intravenous Q12H  . sodium chloride flush  3 mL Intravenous Q12H  . sodium chloride flush  3 mL Intravenous Q12H  . traMADol  50 mg Oral Once   Continuous Infusions: . sodium chloride    . sodium chloride    . sodium chloride     PRN Meds: sodium chloride, sodium chloride, sodium chloride, acetaminophen, albuterol, dextromethorphan, ondansetron (ZOFRAN) IV, sodium chloride flush, sodium chloride flush, sodium chloride flush   Vital Signs    Vitals:   10/16/18 2250 10/16/18 2342 10/17/18 0431 10/17/18 0741  BP: (!) 93/58 108/62    Pulse: 88 98    Resp: 16 18    Temp:  97.8 F (36.6 C) (!) 97.5 F (36.4 C)   TempSrc:  Oral Oral   SpO2: 92% 96%    Weight:    82.7 kg  Height:  5\' 5"  (1.651 m)      Intake/Output Summary (Last 24 hours) at 10/17/2018 0817 Last data filed at 10/16/2018 2342 Gross per 24 hour  Intake 123 ml  Output 975 ml  Net -852 ml   Last 3 Weights 10/17/2018 10/16/2018 10/15/2018  Weight (lbs) 182 lb 5.1 oz 180 lb 1.9 oz 178 lb 12.7 oz  Weight (kg) 82.7 kg 81.7 kg 81.1 kg      Telemetry    15 beat run of VT - Personally Reviewed  ECG    Not performed today - Personally  Reviewed  Physical Exam   GEN: No acute distress.   Neck: No JVD Cardiac: RRR, no murmurs, rubs, or gallops.  Respiratory: Clear to auscultation bilaterally. GI: Soft, nontender, non-distended  MS: No edema; No deformity. Neuro:  Nonfocal  Psych: Normal affect   Labs    Chemistry Recent Labs  Lab 10/15/18 0643 10/16/18 0216 10/16/18 1528 10/16/18 1529 10/17/18 0320  NA 139 136 140 136 139  K 3.6 4.1 3.2* 3.1* 3.7  CL 98 98  --   --  101  CO2 25 21*  --   --  25  GLUCOSE 105* 133*  --   --  95  BUN 24* 33*  --   --  29*  CREATININE 1.25* 1.20*  --   --  1.11*  CALCIUM 9.5 9.6  --   --  9.3  GFRNONAA 43* 45*  --   --  49*  GFRAA 49* 52*  --   --  57*  ANIONGAP 16* 17*  --   --  13     Hematology Recent Labs  Lab 10/14/18 1214  10/14/18 2339 10/16/18 1528 10/16/18 1529  WBC 16.4* 20.8*  --   --   RBC 4.22 4.37  --   --   HGB 12.6 13.0 12.6 12.6  HCT 39.5 40.1 37.0 37.0  MCV 93.6 91.8  --   --   MCH 29.9 29.7  --   --   MCHC 31.9 32.4  --   --   RDW 12.7 12.8  --   --   PLT 182 196  --   --     Cardiac Enzymes Recent Labs  Lab 10/14/18 1214 10/14/18 1950 10/14/18 2339 10/15/18 0643  TROPONINI <0.03 0.03* 0.04* 0.03*   No results for input(s): TROPIPOC in the last 168 hours.   BNP Recent Labs  Lab 10/14/18 1214  BNP 774.4*     DDimer No results for input(s): DDIMER in the last 168 hours.   Radiology    No results found.  Cardiac Studies   2D Echo 10/14/18 IMPRESSIONS    1. The left ventricle has a 2D calculated ejection fraction 15-20%%. The cavity size was mildly dilated. Left ventricular diastolic Doppler parameters are indeterminate.  2. The right ventricle has normal systolic function. The cavity was normal. There is no increase in right ventricular wall thickness.  3. Left atrial size was moderately dilated.  4. The mitral valve is rheumatic. Moderate thickening of the mitral valve leaflet. There is mild to moderate mitral annular  calcification present. Mitral valve regurgitation is severe by color flow Doppler. The MR jet is centrally-directed.  5. The aortic valve is tricuspid. Mild calcification of the aortic valve.  6. The aortic root and ascending aorta are normal in size and structure.  7. The interatrial septum was not well visualized.  R/LHC 10/16/18 Procedures   RIGHT/LEFT HEART CATH AND CORONARY ANGIOGRAPHY  Conclusion     There is severe left ventricular systolic dysfunction.  LV end diastolic pressure is moderately elevated.   1.  Normal coronary arteries.  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 2.  Severely reduced LV systolic function with an EF of 20 to 25%. 3.  Right heart catheterization showed moderately elevated filling pressures, mild pulmonary hypertension and mildly reduced cardiac output.  Recommendations: Continue IV diuresis.  Recommend medical therapy for nonischemic cardiomyopathy.     Patient Profile     73 y.o.femalewith PMH of NICM s/p ICD (EF improved to 60-65% by echo 12/2017), HTN and COPD who presented with CP and dyspnea.EF down to 15% again.  Assessment & Plan    1. Acute Systolic CHF: Admit BNP elevated at 774 and admit CXR showed mild interstitial pulmonary edema. She has been treated w/ IV Lasix. RHC yesterday showed moderately elevated filling pressures and mild pulmonary HTN and mildly reduced cardiac output. Additional IV Lasix recommended. She is getting IV Lasix 20 mg BID (AM dose was held yesterday prior to cath). Therefore only 975 cc total in UOP yesterday. Net I/Os since admit -2.5 L. Was SOB last night while trying to sleep and mildly hypoxic requiring supplemental O2. Continue to diurese today w/ IV lasix. Continue strict I/Os and closely monitoring of renal function and electrolytes. Recommend low sodium diet.   2. NICM: long history of fluctuating EF. She says previous low pumping function was also preceded by viral symptoms.  Cardiac cath yesterday confirmed NICM. Normal coronaries. Continue medical management. Continue on coreg. Her home losartan and spironolactone currently on hold for renal insuffiencey and soft BP.  Plan to eventually  resume both, as renal function and BP improves. Note, she could not afford Entresto in the past.  Has had runs of NSVT on tele. K WNL. Continue  blocker. She already has an ICD for primary prevention.   3. Severe MR: noted on recent echo 10/14/18. Centrally directed, significant change when compare to previous echo in 12/2017 at which time EF was normal and MR was mild. Further w/u per MD.   4. HTN: BP still soft in the mid 78L systolic. No symptoms of hypotension. Has been getting Coreg and lasix. No issues. Home losartan and spironolactone remain on hold. Resume once pressures improve.   5. COPD: breathing improved but still w/ mild dyspnea, but suspect primarily CHF. Management  per primary.   6. Renal Insuffiencey: improving. Scr down from 1.25>>1.20>>1.11 today. Continue to hold losartan and spironolactone give soft BP.   For questions or updates, please contact [place] Please consult [email or web address] for contact info under        Signed, Lyda Jester, PA-C  10/17/2018, 8:17 AM     Attending Note:   The patient was seen and examined.  Agree with assessment and plan as noted above.  Changes made to the above note as needed.  Patient seen and independently examined with Lyda Jester, PA .   We discussed all aspects of the encounter. I agree with the assessment and plan as stated above.  1.  Acute on chronic systolic chf:    At cath yesterday,  Normal coronaries,  RV pressure is 42,  RA = 12,  Mean PCWP is 21 .  She has diuresed 2.2 liters which has resulted in hypotension  ( 78/50 standing,  90/60 lying in bed) She as reminded me that she is very sensitive to diuretics and has had stage I CKD on multiple occasions . I've explained that she was very short of  breath when she was admitted and cath showed elevated filling pressures.  She does appear to be very sensitive to diuretics and its been difficult to achieve the optimal volume status.   2  Chest pain:  Normal cors.  3.  MR - was severe on last echo.  Has not been this bad on past echoes.   Suggestive of a functional MR that worsens with LV dilitation. This may need additional work up - ? TEE    I have discussed with Dr. [name] and with Dr. [name]. Dr. [name] and the CHF team will see her this afternoon    I have spent a total of 40 minutes with patient reviewing hospital  notes , telemetry, EKGs, labs and examining patient as well as establishing an assessment and plan that was discussed with the patient. > 50% of time was spent in direct patient care.    Thayer Headings, Brooke Bonito., MD, [facility] 10/17/2018, 10:20 AM [address],  [place] Pager [phone]

## 2018-10-17 NOTE — Care Management Important Message (Signed)
Important Message  Patient Details  Name: Ashley [name] MRN: [id number] Date of Birth: [birth year]   Medicare Important Message Given:  Yes    Orbie Pyo 10/17/2018, 2:30 PM

## 2018-10-18 DIAGNOSIS — I428 Other cardiomyopathies: Secondary | ICD-10-CM

## 2018-10-18 LAB — BASIC METABOLIC PANEL
Anion gap: 13 (ref 5–15)
BUN: 33 mg/dL — ABNORMAL HIGH (ref 8–23)
CO2: 24 mmol/L (ref 22–32)
Calcium: 9.6 mg/dL (ref 8.9–10.3)
Chloride: 102 mmol/L (ref 98–111)
Creatinine, Ser: 1.2 mg/dL — ABNORMAL HIGH (ref 0.44–1.00)
GFR calc Af Amer: 52 mL/min — ABNORMAL LOW (ref >60)
GFR calc non Af Amer: 45 mL/min — ABNORMAL LOW (ref >60)
Glucose, Bld: 108 mg/dL — ABNORMAL HIGH (ref 70–99)
Potassium: 4 mmol/L (ref 3.5–5.1)
Sodium: 139 mmol/L (ref 135–145)

## 2018-10-18 MED ORDER — SPIRONOLACTONE 12.5 MG HALF TABLET
12.5000 mg | ORAL_TABLET | Freq: Every day | ORAL | Status: DC
  Administered 2018-10-18 – 2018-10-19 (×2): 12.5 mg via ORAL
  Filled 2018-10-18 (×2): qty 1

## 2018-10-18 MED ORDER — BENZONATATE 100 MG PO CAPS
200.0000 mg | ORAL_CAPSULE | Freq: Three times a day (TID) | ORAL | Status: DC | PRN
  Administered 2018-10-18 – 2018-10-19 (×2): 200 mg via ORAL
  Filled 2018-10-18 (×2): qty 2

## 2018-10-18 NOTE — Progress Notes (Signed)
PROGRESS NOTE    Ashley [name]  HYQ:[id number] DOB: 03/08/1946 DOA: 10/14/2018 PCP: [name], MD    Brief Narrative:  73 year old female with medical history significant for obstructive sleep apnea, nonischemic cardiomyopathy with AICD, COPD, hypertension and hyperlipidemia; who presented to the emergency department secondary to chest discomfort and shortness of breath.  Recently treated as an outpatient for concern of COPD exacerbation with prednisone and antibiotic without significant improvement in her symptoms.  Elevated BNP and vascular congestion appreciated on chest x-ray.  Patient has been admitted for further evaluation and management of CHF exacerbation and chest discomfort.  Assessment & Plan:   Principal Problem:   Acute on chronic combined systolic and diastolic CHF (congestive heart failure) (HCC) Active Problems:   Dyslipidemia   Acute hypotension   CKD (chronic kidney disease) stage 3, GFR 30-59 ml/min (HCC)   ICD (implantable cardioverter-defibrillator) in place   Acute respiratory distress  1-Acute on chronic combined systolic and diastolic CHF (congestive heart failure) (HCC) -Continue low-sodium diet, daily weight and strict I's and O's -Patient on beta-blocker and ARB -Bedside echo in ED for cardiology service demonstrated once again decreasing her ejection fraction. -Cardiology following, s/p R heart cath 6/3 with findings concerning for continued vol overload -Discussed case with Cardiology. Plan to f/u with heart failure team  2-acute symptomatic hypotension -In the setting of aggressive diuresis, improved with gentle IVF hydration -Adjust dose of beta-blocker -Holding spironolactone and losartan. -Follow vital signs closely. Cont to hold meds as needed for hypotension  3-mixed HLD -continue statins and zetia -Remains stable at this time  4-CKD (chronic kidney disease) stage 3, GFR 30-59 ml/min (HCC) -Cr remains stable currently. Labs  reviewed -will follow renal function trend   5-ICD (implantable cardioverter-defibrillator) in place -appears to be working as intended -HR stable at this time  6-GERD -continue PPI -Presently stable  7-COPD -currently no wheezing  -continue PRN albuterol as needed  DVT prophylaxis: Lovenox subQ Code Status: Full Family Communication: Pt in room, family not at bedside Disposition Plan: Uncertain at this time  Consultants:   Cardiology  Procedures:     Antimicrobials: Anti-infectives (From admission, onward)   None      Subjective: No complaints currently  Objective: Vitals:   10/17/18 1629 10/17/18 2027 10/18/18 0540 10/18/18 0902  BP: 105/61 105/64  111/64  Pulse: 90 92  85  Resp: 20 19  14   Temp: 98.1 F (36.7 C) 98.2 F (36.8 C) 97.8 F (36.6 C) 98 F (36.7 C)  TempSrc: Oral Oral Oral Oral  SpO2: 96% 96%  97%  Weight:      Height:        Intake/Output Summary (Last 24 hours) at 10/18/2018 1103 Last data filed at 10/17/2018 1820 Gross per 24 hour  Intake 320 ml  Output -  Net 320 ml   Filed Weights   10/16/18 0500 10/17/18 0741 10/17/18 1135  Weight: 81.7 kg 82.7 kg 80 kg    Examination: General exam: Awake, laying in bed, in nad Respiratory system: Normal respiratory effort, no wheezing  Data Reviewed: I have personally reviewed following labs and imaging studies  CBC: Recent Labs  Lab 10/14/18 1214 10/14/18 2339 10/16/18 1528 10/16/18 1529  WBC 16.4* 20.8*  --   --   NEUTROABS  --  12.6*  --   --   HGB 12.6 13.0 12.6 12.6  HCT 39.5 40.1 37.0 37.0  MCV 93.6 91.8  --   --   PLT 182 196  --   --  Basic Metabolic Panel: Recent Labs  Lab 10/14/18 1214 10/15/18 0643 10/16/18 0216 10/16/18 1528 10/16/18 1529 10/17/18 0320 10/18/18 0229  NA 139 139 136 140 136 139 139  K 3.8 3.6 4.1 3.2* 3.1* 3.7 4.0  CL 103 98 98  --   --  101 102  CO2 23 25 21*  --   --  25 24  GLUCOSE 100* 105* 133*  --   --  95 108*  BUN 19 24* 33*   --   --  29* 33*  CREATININE 1.20* 1.25* 1.20*  --   --  1.11* 1.20*  CALCIUM 10.0 9.5 9.6  --   --  9.3 9.6   GFR: Estimated Creatinine Clearance: 43.6 mL/min (A) (by C-G formula based on SCr of 1.2 mg/dL (H)). Liver Function Tests: No results for input(s): AST, ALT, ALKPHOS, BILITOT, PROT, ALBUMIN in the last 168 hours. No results for input(s): LIPASE, AMYLASE in the last 168 hours. No results for input(s): AMMONIA in the last 168 hours. Coagulation Profile: No results for input(s): INR, PROTIME in the last 168 hours. Cardiac Enzymes: Recent Labs  Lab 10/14/18 1214 10/14/18 1950 10/14/18 2339 10/15/18 0643  TROPONINI <0.03 0.03* 0.04* 0.03*   BNP (last 3 results) No results for input(s): PROBNP in the last 8760 hours. HbA1C: No results for input(s): HGBA1C in the last 72 hours. CBG: Recent Labs  Lab 10/15/18 0718  GLUCAP 132*   Lipid Profile: No results for input(s): CHOL, HDL, LDLCALC, TRIG, CHOLHDL, LDLDIRECT in the last 72 hours. Thyroid Function Tests: No results for input(s): TSH, T4TOTAL, FREET4, T3FREE, THYROIDAB in the last 72 hours. Anemia Panel: No results for input(s): VITAMINB12, FOLATE, FERRITIN, TIBC, IRON, RETICCTPCT in the last 72 hours. Sepsis Labs: No results for input(s): PROCALCITON, LATICACIDVEN in the last 168 hours.  Recent Results (from the past 240 hour(s))  Novel Coronavirus, NAA ([facility])     Status: None   Collection Time: 10/09/18  2:58 PM  Result Value Ref Range Status   SARS-CoV-2, NAA Not Detected Not Detected Final    Comment: This test was developed and its performance characteristics determined by [employer]. This test has not been FDA cleared or approved. This test has been authorized by FDA under an Emergency Use Authorization (EUA). This test is only authorized for the duration of time the declaration that circumstances exist justifying the authorization of the emergency use of in vitro diagnostic tests for  detection of SARS-CoV-2 virus and/or diagnosis of COVID-19 infection under section 564(b)(1) of the Act, 21 U.S.C. 841LKG-4(W)(1), unless the authorization is terminated or revoked sooner. When diagnostic testing is negative, the possibility of a false negative result should be considered in the context of a patient's recent exposures and the presence of clinical signs and symptoms consistent with COVID-19. An individual without symptoms of COVID-19 and who is not shedding SARS-CoV-2 virus would expect to have a negative (not detected) result in this assay.   SARS Coronavirus 2 (CEPHEID - Performed in [place] hospital lab), Hosp Order     Status: None   Collection Time: 10/14/18 12:15 PM  Result Value Ref Range Status   SARS Coronavirus 2 NEGATIVE NEGATIVE Final    Comment: (NOTE) If result is NEGATIVE SARS-CoV-2 target nucleic acids are NOT DETECTED. The SARS-CoV-2 RNA is generally detectable in upper and lower  respiratory specimens during the acute phase of infection. The lowest  concentration of SARS-CoV-2 viral copies this assay can detect is 250  copies /  mL. A negative result does not preclude SARS-CoV-2 infection  and should not be used as the sole basis for treatment or other  patient management decisions.  A negative result may occur with  improper specimen collection / handling, submission of specimen other  than nasopharyngeal swab, presence of viral mutation(s) within the  areas targeted by this assay, and inadequate number of viral copies  (<250 copies / mL). A negative result must be combined with clinical  observations, patient history, and epidemiological information. If result is POSITIVE SARS-CoV-2 target nucleic acids are DETECTED. The SARS-CoV-2 RNA is generally detectable in upper and lower  respiratory specimens dur ing the acute phase of infection.  Positive  results are indicative of active infection with SARS-CoV-2.  Clinical  correlation with patient  history and other diagnostic information is  necessary to determine patient infection status.  Positive results do  not rule out bacterial infection or co-infection with other viruses. If result is PRESUMPTIVE POSTIVE SARS-CoV-2 nucleic acids MAY BE PRESENT.   A presumptive positive result was obtained on the submitted specimen  and confirmed on repeat testing.  While 2019 novel coronavirus  (SARS-CoV-2) nucleic acids may be present in the submitted sample  additional confirmatory testing may be necessary for epidemiological  and / or clinical management purposes  to differentiate between  SARS-CoV-2 and other Sarbecovirus currently known to infect humans.  If clinically indicated additional testing with an alternate test  methodology [phone]) is advised. The SARS-CoV-2 RNA is generally  detectable in upper and lower respiratory sp ecimens during the acute  phase of infection. The expected result is Negative. Fact Sheet for Patients:  StrictlyIdeas.no Fact Sheet for Healthcare Providers: BankingDealers.co.za This test is not yet approved or cleared by the [place] FDA and has been authorized for detection and/or diagnosis of SARS-CoV-2 by FDA under an Emergency Use Authorization (EUA).  This EUA will remain in effect (meaning this test can be used) for the duration of the COVID-19 declaration under Section 564(b)(1) of the Act, 21 U.S.C. section 360bbb-3(b)(1), unless the authorization is terminated or revoked sooner. Performed at [facility], [place] [address], [place], [place] [address]   MRSA PCR Screening     Status: None   Collection Time: 10/14/18  7:59 PM  Result Value Ref Range Status   MRSA by PCR NEGATIVE NEGATIVE Final    Comment:        The GeneXpert MRSA Assay (FDA approved for NASAL specimens only), is one component of a comprehensive MRSA colonization surveillance program. It is not intended to diagnose  MRSA infection nor to guide or monitor treatment for MRSA infections. Performed at [facility], [place] [address], [place], [place] [address]      Radiology Studies: No results found.  Scheduled Meds: . aspirin EC  81 mg Oral QHS  . carvedilol  3.125 mg Oral BID WC  . enoxaparin (LOVENOX) injection  40 mg Subcutaneous Q24H  . ezetimibe  10 mg Oral QHS  . furosemide  20 mg Intravenous Q12H  . guaiFENesin  600 mg Oral Daily  . mouth rinse  15 mL Mouth Rinse BID  . omega-3 acid ethyl esters  1 g Oral Weekly  . traMADol  50 mg Oral Once   Continuous Infusions: . sodium chloride       LOS: 4 days   Marylu Lund, MD [facility] Pager On Amion  If 7PM-7AM, please contact night-coverage 10/18/2018, 11:03 AM

## 2018-10-18 NOTE — Progress Notes (Signed)
Physical Therapy Treatment and Discharge  Patient Details Name: Ashley [name] MRN: [id number] DOB: 1946/04/06 Today's Date: 10/18/2018    History of Present Illness 73 year old female with medical history significant for obstructive sleep apnea, nonischemic cardiomyopathy with AICD, COPD, hypertension and hyperlipidemia; who presented to the emergency department secondary to chest discomfort and shortness of breath.  Recently treated as an outpatient for concern of COPD exacerbation with prednisone and antibiotic without significant improvement in her symptoms.  Elevated BNP and vascular congestion appreciated on chest x-ray.  Patient has been admitted for further evaluation and management of CHF exacerbation and chest discomfort.  Pt s/p R heart cath 6/3 with findings concerning for continued vol overload    PT Comments    Pt is mobilizing well.  Supervision level only due to lines for heart monitor and reminders for limiting WBing to right wrist s/p cardiac cath (radial access).  Pt agreeable to ambulate with staff during remainder of acute stay.  Pt has supportive spouse and was explaining to therapist her previous diet mistakes.  Pt agreeable to d/c from acute PT at this time.    Follow Up Recommendations  No PT follow up     Equipment Recommendations  None recommended by PT    Recommendations for Other Services       Precautions / Restrictions Precautions Precautions: Other (comment) Precaution Comments: limited weight bearing to Right wrist (radial access site for cath)    Mobility  Bed Mobility Overal bed mobility: Needs Assistance Bed Mobility: Supine to Sit     Supine to sit: Supervision     General bed mobility comments: only supervision due to cues for limiting R wrist weight bearing s/p cardiac cath  Transfers Overall transfer level: Needs assistance Equipment used: None Transfers: Sit to/from Stand Sit to Stand: Supervision         General transfer  comment: supervision for safety   Ambulation/Gait Ambulation/Gait assistance: Supervision Gait Distance (Feet): 160 Feet Assistive device: None Gait Pattern/deviations: WFL(Within Functional Limits)     General Gait Details: pt pushed heart monitor pole, no unsteadiness or LOB observed, vitals WNL   Stairs             Wheelchair Mobility    Modified Rankin (Stroke Patients Only)       Balance                                            Cognition Arousal/Alertness: Awake/alert Behavior During Therapy: WFL for tasks assessed/performed Overall Cognitive Status: Within Functional Limits for tasks assessed                                        Exercises      General Comments        Pertinent Vitals/Pain Pain Assessment: No/denies pain    Home Living                      Prior Function            PT Goals (current goals can now be found in the care plan section) Progress towards PT goals: Goals met/education completed, patient discharged from PT    Frequency           PT Plan Other (comment)(d/c from  acute PT)    Co-evaluation              AM-PAC PT "6 Clicks" Mobility   Outcome Measure  Help needed turning from your back to your side while in a flat bed without using bedrails?: None Help needed moving from lying on your back to sitting on the side of a flat bed without using bedrails?: None Help needed moving to and from a bed to a chair (including a wheelchair)?: None Help needed standing up from a chair using your arms (e.g., wheelchair or bedside chair)?: A Little Help needed to walk in hospital room?: A Little Help needed climbing 3-5 steps with a railing? : A Little 6 Click Score: 21    End of Session   Activity Tolerance: Patient tolerated treatment well Patient left: in bed;with call bell/phone within reach   PT Visit Diagnosis: Difficulty in walking, not elsewhere classified  (R26.2)     Time: 3750-5107 PT Time Calculation (min) (ACUTE ONLY): 18 min  Charges:  $Gait Training: 8-22 mins                     Carmelia Bake, PT, DPT Acute Rehabilitation Services Office: [phone] Pager: [phone]  York Ram E 10/18/2018, 1:20 PM

## 2018-10-18 NOTE — Progress Notes (Signed)
Progress Note  Patient Name: Ashley [name] Date of Encounter: 10/18/2018  Primary Cardiologist: [name], MD   Subjective   Pt reports she had a rough night. Did not sleep well and was SOB and coughing. Reports her O2 sats dropped in the upper 80s. She required supplemental O2 via [place]. Feeling a bit better this morning, just tired/ sleepy. Radial access site is ok. No CP.   Is breathing well today  Close to baseline   Inpatient Medications    Scheduled Meds: . aspirin EC  81 mg Oral QHS  . carvedilol  3.125 mg Oral BID WC  . enoxaparin (LOVENOX) injection  40 mg Subcutaneous Q24H  . ezetimibe  10 mg Oral QHS  . furosemide  20 mg Intravenous Q12H  . guaiFENesin  600 mg Oral Daily  . mouth rinse  15 mL Mouth Rinse BID  . omega-3 acid ethyl esters  1 g Oral Weekly  . traMADol  50 mg Oral Once   Continuous Infusions: . sodium chloride     PRN Meds: sodium chloride, acetaminophen, albuterol, benzonatate, dextromethorphan, ondansetron (ZOFRAN) IV, sodium chloride flush   Vital Signs    Vitals:   10/17/18 1629 10/17/18 2027 10/18/18 0540 10/18/18 0902  BP: 105/61 105/64  111/64  Pulse: 90 92  85  Resp: 20 19  14   Temp: 98.1 F (36.7 C) 98.2 F (36.8 C) 97.8 F (36.6 C) 98 F (36.7 C)  TempSrc: Oral Oral Oral Oral  SpO2: 96% 96%  97%  Weight:      Height:        Intake/Output Summary (Last 24 hours) at 10/18/2018 1102 Last data filed at 10/17/2018 1820 Gross per 24 hour  Intake 320 ml  Output -  Net 320 ml   Last 3 Weights 10/17/2018 10/17/2018 10/16/2018  Weight (lbs) 176 lb 4.8 oz 182 lb 5.1 oz 180 lb 1.9 oz  Weight (kg) 79.969 kg 82.7 kg 81.7 kg      Telemetry      - Personally Reviewed  ECG    Not performed today - Personally Reviewed  Physical Exam   Physical Exam: Blood pressure 111/64, pulse 85, temperature 98 F (36.7 C), temperature source Oral, resp. rate 14, height 5\' 5"  (1.651 m), weight 80 kg, SpO2 97 %.  GEN:  Elderly female   HEENT: Normal NECK: No JVD; No carotid bruits LYMPHATICS: No lymphadenopathy CARDIAC: RRR did not hear any MR murmur today  RESPIRATORY:  Clear to auscultation without rales, wheezing or rhonchi  ABDOMEN: Soft, non-tender, non-distended MUSCULOSKELETAL:  No edema; No deformity  SKIN: Warm and dry NEUROLOGIC:  Alert and oriented x 3   Labs    Chemistry Recent Labs  Lab 10/16/18 0216  10/16/18 1529 10/17/18 0320 10/18/18 0229  NA 136   < > 136 139 139  K 4.1   < > 3.1* 3.7 4.0  CL 98  --   --  101 102  CO2 21*  --   --  25 24  GLUCOSE 133*  --   --  95 108*  BUN 33*  --   --  29* 33*  CREATININE 1.20*  --   --  1.11* 1.20*  CALCIUM 9.6  --   --  9.3 9.6  GFRNONAA 45*  --   --  49* 45*  GFRAA 52*  --   --  57* 52*  ANIONGAP 17*  --   --  13 13   < > =  values in this interval not displayed.     Hematology Recent Labs  Lab 10/14/18 1214 10/14/18 2339 10/16/18 1528 10/16/18 1529  WBC 16.4* 20.8*  --   --   RBC 4.22 4.37  --   --   HGB 12.6 13.0 12.6 12.6  HCT 39.5 40.1 37.0 37.0  MCV 93.6 91.8  --   --   MCH 29.9 29.7  --   --   MCHC 31.9 32.4  --   --   RDW 12.7 12.8  --   --   PLT 182 196  --   --     Cardiac Enzymes Recent Labs  Lab 10/14/18 1214 10/14/18 1950 10/14/18 2339 10/15/18 0643  TROPONINI <0.03 0.03* 0.04* 0.03*   No results for input(s): TROPIPOC in the last 168 hours.   BNP Recent Labs  Lab 10/14/18 1214  BNP 774.4*     DDimer No results for input(s): DDIMER in the last 168 hours.   Radiology    No results found.  Cardiac Studies   2D Echo 10/14/18 IMPRESSIONS    1. The left ventricle has a 2D calculated ejection fraction 15-20%%. The cavity size was mildly dilated. Left ventricular diastolic Doppler parameters are indeterminate.  2. The right ventricle has normal systolic function. The cavity was normal. There is no increase in right ventricular wall thickness.  3. Left atrial size was moderately dilated.  4. The mitral  valve is rheumatic. Moderate thickening of the mitral valve leaflet. There is mild to moderate mitral annular calcification present. Mitral valve regurgitation is severe by color flow Doppler. The MR jet is centrally-directed.  5. The aortic valve is tricuspid. Mild calcification of the aortic valve.  6. The aortic root and ascending aorta are normal in size and structure.  7. The interatrial septum was not well visualized.  R/LHC 10/16/18 Procedures   RIGHT/LEFT HEART CATH AND CORONARY ANGIOGRAPHY  Conclusion     There is severe left ventricular systolic dysfunction.  LV end diastolic pressure is moderately elevated.   1.  Normal coronary arteries.  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 2.  Severely reduced LV systolic function with an EF of 20 to 25%. 3.  Right heart catheterization showed moderately elevated filling pressures, mild pulmonary hypertension and mildly reduced cardiac output.  Recommendations: Continue IV diuresis.  Recommend medical therapy for nonischemic cardiomyopathy.     Patient Profile     73 y.o.femalewith PMH of NICM s/p ICD (EF improved to 60-65% by echo 12/2017), HTN and COPD who presented with CP and dyspnea.EF down to 15% again.  Assessment & Plan    1. Acute Systolic CHF: Admit BNP elevated at 774 and admit CXR showed mild interstitial pulmonary edema. She has been treated w/ IV Lasix. RHC yesterday showed moderately elevated filling pressures and mild pulmonary HTN and mildly reduced cardiac output.    She has diuresed well and is very close to her baseline She would like to follow up with the CHF clinic  Dr. [name] is planning on coming by today   2. NICM: long history of fluctuating EF. She says previous low pumping function was also preceded by viral symptoms. Cardiac cath   confirmed NICM. Normal coronaries. .   3. Severe MR: noted on recent echo 10/14/18. Centrally directed, significant change when compare to  previous echo in 12/2017 at which time EF was normal and MR was mild.   I do not hear a systolic murmur this am  4. HTN:  BP seems to be stable   5. COPD: breathing improved but still w/ mild dyspnea, but suspect primarily CHF. Management  per primary.   6. Renal Insuffiencey: improving. Scr down from 1.25>>1.20>>1.11 today. Continue to hold losartan and spironolactone give soft BP.  Creatinine appears to be stable     Mertie Moores, MD  10/18/2018 11:07 AM    [address],  [place], [place]  [address] Pager [phone] Phone: [phone]; Fax: [phone]

## 2018-10-18 NOTE — Consult Note (Addendum)
Advanced Heart Failure Team Consult Note   Primary Physician: [name], MD PCP-Cardiologist:  [name], MD  Hematology: Dr Annabelle Harman EP: Dr Lovena Le   Reason for Consultation: Heart Failure   HPI:    Ashley [name] is seen today for evaluation of heart failure at the request of Dr Acie Fredrickson.   Ashley [name] is a 73 year old with a history of HTN, COPD, GERD , fibromyaligia, and elevated iGm 2016 but no M spike in 2019, ICD, and  NICM. EF in 2019 had recovered to 60-65% but now EF is back down to 15-20%.   Has h/o NICM dating back to 2012 when she had PNA October 2012 ejection fraction  25-35%,  September 2013 ejection fraction 30-35%, ICD placed EF 50 to 55% in 02/2013 EF 25 to 30% in 06/2015 EF 35 to 40% in 12/2016 EF 40 to 45% in 07/2016    In February of this year she was evaluated for syncope and this was thought to be vasovagal. She later saw Dr [name] in follow up and he mentioned LBBB starting in 6/19 but last EF was 60%.    She has been followed by [facility] hematology for elevated IgM since 2016 with no M spike noted in 2019. Scans completed did not show lymphoma. 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   Presented to [facility] on 10/14/18 with increased dyspnea and chest pain. Prior to admit she had been treated with levaquin + prednisone for bronchitis. CXR was concerning for interstitial edema. Bedside ECHO performed in the ED showed severely reduced LVEF-->15% and severe MR. Placed on IV lasix and set up for cath. On 6/3 had RHC/LHC with normal coronaries, EF 20-25%, and mildly elevated filling pressures. Renal function has been stable.   Cardiac Studies  RHC/LHC 10/16/2018  LVEF 20-25. Normal coronaries RA 8 PCWP 21 CO 4.45 CI 2.35   ECHO 10/14/2018 EF 15-20%. RV normal, Severe central mitral valve regurgitation.    1. The left ventricle has a 2D calculated ejection fraction 15-20%%. The cavity size was mildly dilated.  Left ventricular diastolic Doppler parameters are indeterminate.  2. The right ventricle has normal systolic function. The cavity was normal. There is no increase in right ventricular wall thickness.  3. Left atrial size was moderately dilated.  4. The mitral valve is rheumatic. Moderate thickening of the mitral valve leaflet. There is mild to moderate mitral annular calcification present. Mitral valve regurgitation is severe by color flow Doppler. The MR jet is centrally-directed.  Echo 12/2017 EF 60-65%   Stress test June 2019, performed for left bundle branch block and syncope Low risk study  Review of Systems: [y] = yes, [ ]  = no    General: Weight gain [Y ]; Weight loss [ ] ; Anorexia [ ] ; Fatigue [Y]; Fever [ ] ; Chills [ ] ; Weakness [ ]    Cardiac: Chest pain/pressure [ ] ; Resting SOB [ ] ; Exertional SOB [ Y]; Orthopnea [ ] ; Pedal Edema [ ] ; Palpitations [ ] ; Syncope [ ] ; Presyncope [ ] ; Paroxysmal nocturnal dyspnea[ ]    Pulmonary: Cough [ ] ; Wheezing[ ] ; Hemoptysis[ ] ; Sputum [ ] ; Snoring [ ]    GI: Vomiting[ ] ; Dysphagia[ ] ; Melena[ ] ; Hematochezia [ ] ; Heartburn[ ] ; Abdominal pain [ ] ; Constipation [ ] ; Diarrhea [ ] ; BRBPR [ ]    GU: Hematuria[ ] ; Dysuria [ ] ; Nocturia[ ]    Vascular: Pain in legs with walking [ ] ; Pain in  feet with lying flat [ ] ; Non-healing sores [ ] ; Stroke [ ] ; TIA [ ] ; Slurred speech [ ] ;   Neuro: Headaches[ ] ; Vertigo[ ] ; Seizures[ ] ; Paresthesias[ ] ;Blurred vision [ ] ; Diplopia [ ] ; Vision changes [ ]    Ortho/Skin: Arthritis [Y ]; Joint pain [Y ]; Muscle pain [Y]; Joint swelling [ ] ; Back Pain [Y ]; Rash [ ]    Psych: Depression[ ] ; Anxiety[ Y]   Heme: Bleeding problems [ ] ; Clotting disorders [ ] ; Anemia [ ]    Endocrine: Diabetes [ ] ; Thyroid dysfunction[ ]   Home Medications Prior to Admission medications   Medication Sig Start Date End Date Taking? Authorizing Provider  acetaminophen (TYLENOL) 500 MG tablet Take 500 mg by mouth daily as needed for  moderate pain or headache (PAIN).    Yes [provider]  albuterol (PROVENTIL HFA;VENTOLIN HFA) 108 (90 Base) MCG/ACT inhaler TAKE 2 PUFFS BY MOUTH EVERY 6 HOURS AS NEEDED FOR WHEEZE OR SHORTNESS OF BREATH Patient taking differently: Inhale 2 puffs into the lungs every 6 (six) hours as needed for wheezing or shortness of breath.  01/10/18  Yes [name], MD  Ascorbic Acid (VITAMIN C) 1000 MG tablet Take 1,000 mg by mouth daily.   Yes [provider]  aspirin EC 81 MG tablet Take 81 mg by mouth at bedtime.    Yes [provider]  BIOTIN PO Take 1 tablet by mouth daily.   Yes [provider]  carvedilol (COREG) 6.25 MG tablet TAKE 1 TABLET (6.25 MG TOTAL) BY MOUTH 2 (TWO) TIMES DAILY WITH A MEAL. 06/06/18  Yes Minna Merritts, MD  cholecalciferol (VITAMIN D) 1000 units tablet Take 2,000 Units by mouth daily.   Yes [provider]  Coenzyme Q10 (CO Q 10 PO) Take 1 tablet by mouth daily.   Yes [provider]  dextromethorphan (DELSYM) 30 MG/5ML liquid Take 15 mg by mouth at bedtime as needed for cough.   Yes [provider]  Diphenhyd-Hydrocort-Nystatin (FIRST-DUKES MOUTHWASH) SUSP Swish and swallow 96ml every 6 hours as needed 04/05/18  Yes Vivi Barrack, MD  ezetimibe (ZETIA) 10 MG tablet TAKE 1 TABLET (10 MG TOTAL) BY MOUTH DAILY. Patient taking differently: Take 10 mg by mouth at bedtime.  04/17/18  Yes [name], MD  GARLIC PO Take 1 tablet by mouth daily.   Yes [provider]  guaiFENesin (MUCINEX) 600 MG 12 hr tablet Take 600 mg by mouth daily.   Yes [provider]  levalbuterol (XOPENEX HFA) 45 MCG/ACT inhaler Inhale 2 puffs into the lungs every 4 (four) hours as needed for wheezing or shortness of breath. 09/20/18  Yes [name], MD  losartan (COZAAR) 25 MG tablet Take 1 tablet (25 mg total) by mouth daily. Patient taking differently: Take 12.5 mg by mouth See admin instructions. Takes  Monday, Wednesday, and Friday and Saturday 01/17/18  Yes Gollan, Kathlene November, MD  nystatin cream (MYCOSTATIN) Apply 1 application topically 2 (two) times daily. Patient taking differently: Apply 1 application topically as needed (yeast).  06/22/17  Yes [name], MD  omega-3 acid ethyl esters (LOVAZA) 1 g capsule Take 1 g by mouth once a week. Monday   Yes [provider]  potassium chloride (K-DUR) 10 MEQ tablet Take 10 mEq by mouth once a week. Takes every 7-10 days 01/04/18  Yes [name], MD  spironolactone (ALDACTONE) 25 MG tablet Take 1 tablet (25 mg total) by mouth every other day. Patient taking differently: Take 12.5 mg  by mouth every other day. Monday, Wednesday, Friday and Saturday 01/17/18  Yes Gollan, Kathlene November, MD  torsemide (DEMADEX) 10 MG tablet TAKE 1 TABLET (10 MG TOTAL) BY MOUTH DAILY AS NEEDED (SWELLING). Patient taking differently: Take 10 mg by mouth once a week.  07/12/18  Yes [name], MD  vitamin B-12 (CYANOCOBALAMIN) 1000 MCG tablet Take 1 tablet (1,000 mcg total) by mouth daily. 05/31/18  Yes [name], MD  vitamin E 100 UNIT capsule Take 100 Units by mouth daily.   Yes [provider]  AMBULATORY NON FORMULARY MEDICATION Medication Name:  1 squatty potty to use as needed 01/20/16   Mauri Pole, MD  bisoprolol (ZEBETA) 5 MG tablet Take 5 mg by mouth daily. 06/06/11 07/02/18  Minna Merritts, MD    Past Medical History: Past Medical History:  Diagnosis Date   Abdominal aortic atherosclerosis ([place]) 11/2015   by CT   Allergy    Anemia    Anxiety    Automatic implantable cardioverter-defibrillator in situ 2012   a. MDT single lead ICD.   Bronchiectasis    a. possibly mild, treat URIs aggressively   Cancer ([place])    basal cell, arms & neck   Chronic sinusitis    Depression    Essential hypertension    Fibrocystic breast disease    Fibromyalgia    GERD (gastroesophageal reflux disease)    H/O multiple  allergies 10/10/2013   Hemorrhoids    History of diverticulitis of colon    History of pneumonia 2012   Insomnia    Irritable bowel syndrome    Laryngeal nodule    Dr. Thomasena Edis   Migraine without aura, without mention of intractable migraine without mention of status migrainosus    Mitral regurgitation    Nonischemic cardiomyopathy ([place])    a. Recovered LV fxn - prev as low as 25%, now 50-55% by echo 04/2014;  b. 2012 s/p MDT [id number] [place] VR single lead ICD.   Osteoarthritis    knees, back, hands, low back,    Osteoporosis 01/2013   T-2.7 spine (01/2016)   PONV (postoperative nausea and vomiting)    sigmoid diverticulitis with perforation, abscess and fistula 09/30/2013   Sleep apnea 2010   borderline, inconclusive- /w [place]     Past Surgical History: Past Surgical History:  Procedure Laterality Date   ABDOMINAL HYSTERECTOMY  1987   w/BSO   ANAL RECTAL MANOMETRY N/A 02/02/2016   Procedure: ANO RECTAL MANOMETRY;  Surgeon: Mauri Pole, MD;  Location: [facility];  Service: Endoscopy;  Laterality: N/A;   APPENDECTOMY     AUGMENTATION MAMMAPLASTY     breast cystectomies     due to [place]  01/2010   Left-fibrocystic change w/intraductal papilloma, no malignancy   carotid US  06/7739   2-87% LICA, normal RICA   Chevron Bunionectomy  11/04/08   Right Great Toe (Dr. Beola Cord)   COLONOSCOPY  01/2018   1 small benign polyp, diverticulosis, healthy anastomosis [name] @ [place])   COLOSTOMY N/A 10/02/2013   DESCENDING COLOSTOMY;  Surgeon: Imogene Burn. Tsuei, MD   COLOSTOMY REVERSAL N/A 02/10/2014   [name], MD   EMG/MCV  10/16/01   + mild carpal tunnel   ESOPHAGOGASTRODUODENOSCOPY  09/2014   small HH, irregular Z line, fundic gland polyps with mild gastritis Olevia Perches)   Flex laryngoscopy  06/11/06   ([place]) nml   IMPLANTABLE CARDIOVERTER DEFIBRILLATOR IMPLANT N/A 07/14/2011   MDT single chamber ICD  KNEE ARTHROSCOPY W/ ORIF      Left; "meniscus tear"   LASIK     left eye   MANDIBLE SURGERY     "got about 6 pins in the bottom of my jaw"   NCS  11/2014   L ulnar neuropathy, mild B median nerve entrapment ([place])   OSTEOTOMY     Left foot   PARTIAL COLECTOMY N/A 10/02/2013   PARTIAL COLECTOMY;  Surgeon: Imogene Burn. Tsuei, MD   RIGHT/LEFT HEART CATH AND CORONARY ANGIOGRAPHY N/A 10/16/2018   Procedure: RIGHT/LEFT HEART CATH AND CORONARY ANGIOGRAPHY;  Surgeon: Wellington Hampshire, MD;  Location: [facility];  Service: Cardiovascular;  Laterality: N/A;   sinus surgery     x3 with balloon    TOE AMPUTATION     "toe beside baby toe on left foot; got gangrene from corn"   TONSILLECTOMY AND ADENOIDECTOMY      Family History: Family History  Problem Relation Age of Onset   Lung cancer Father        + smoker   Colon polyps Father    Dementia Mother    Osteoporosis Mother        Lumbar spine   Stroke Mother        x 5 @ 62 YOA   Arthritis Mother        hands   Emphysema Mother    Alcohol abuse Paternal Grandfather    Stroke Paternal Grandfather        ?   Heart disease Paternal Grandfather    Hypertension Maternal Grandfather        ?   Heart disease Paternal Grandmother    Colon cancer Neg Hx    Esophageal cancer Neg Hx    Gallbladder disease Neg Hx     Social History: Social History   Socioeconomic History   Marital status: Married    Spouse name: Not on file   Number of children: 1   Years of education: Not on file   Highest education level: Not on file  Occupational History   Occupation: Oncologist   Occupation: Aeronautical engineer  Social Needs   Financial resource strain: Not on file   Food insecurity:    Worry: Not on file    Inability: Not on file   Transportation needs:    Medical: Not on file    Non-medical: Not on file  Tobacco Use   Smoking status: Never Smoker   Smokeless tobacco: Never Used   Tobacco  comment: Lived with smokers x 23 yrs, smoked herself "for a week"  Substance and Sexual Activity   Alcohol use: No    Alcohol/week: 0.0 standard drinks   Drug use: No   Sexual activity: Not Currently  Lifestyle   Physical activity:    Days per week: Not on file    Minutes per session: Not on file   Stress: Not on file  Relationships   Social connections:    Talks on phone: Not on file    Gets together: Not on file    Attends religious service: Not on file    Active member of club or organization: Not on file    Attends meetings of clubs or organizations: Not on file    Relationship status: Not on file  Other Topics Concern   Not on file  Social History Narrative   Lives with husband Ashley [name]   1 adopted child    Allergies:  Allergies  Allergen Reactions   Antihistamines, Chlorpheniramine-Type Other (See Comments)    Alters vision   Codeine Other (See Comments)    Confusion and dizziness   Cyclobenzaprine Other (See Comments)    Adverse reaction - back and throat pain, dizziness, exhaustion   Cymbalta [Duloxetine Hcl] Other (See Comments)    "body spasms and made me feel weird in my head"    Influenza Vac Split [Flu Virus Vaccine] Other (See Comments)    "allergic to concentrated eggs that are in vaccine"   Prevnar [Pneumococcal 13-Val Conj Vacc] Other (See Comments)    Egg allergy Bad reaction after shot   Saline Rash    FEVER, BURNING   Sulfasalazine Other (See Comments)    "makes my whole body smell like sulfa & makes me sick just smelling it"   Atorvastatin Other (See Comments)    Severe muscle cramps to generic atorvastatin.  Able to take brand lipitor.   Chlorhexidine Other (See Comments)    blisters   Dilaudid [Hydromorphone Hcl] Other (See Comments)    Feels like she is burning internally    Latex Nausea Only, Rash and Other (See Comments)    Pain and infection   Morphine And Related Other (See Comments)    Internally feels like she is  on fire    Pravastatin Other (See Comments)    myalgias   Prevacid [Lansoprazole] Other (See Comments)    Worsened GI side effects   Red Dye Nausea Only and Swelling   Rosuvastatin Other (See Comments)    Was not effective controlling lipids   Simvastatin Other (See Comments)    Leg cramps   Diflucan [Fluconazole] Nausea And Vomiting   Onion     Diarrhea, GI distress, flared IBS   Other     NOTE: pt is able to take cephalosporins without reaction   Penicillins Other (See Comments)    CHILDHOOD ALLEGY/REACTION: "told 50 years ago I couldn't take it; might be immune to it", able to take amoxicillin Has patient had a PCN reaction causing immediate rash, facial/tongue/throat swelling, SOB or lightheadedness with hypotension: Unknown Has patient had a PCN reaction causing severe rash involving mucus membranes or skin necrosis: UnknowN Has patient had a PCN reaction that required hospitalization: /Unknown Has patient had a PCN reaction occurring within the last 10 years: Unknown If a   Sertraline Other (See Comments)    Overly sedating   Tape Other (See Comments)    All adhesives  - Blisters, itching and burning    Metaxalone Other (See Comments)    REACTION: unknown    Objective:    Vital Signs:   Temp:  [97.8 F (36.6 C)-98.2 F (36.8 C)] 98.2 F (36.8 C) (06/05 1201) Pulse Rate:  [85-97] 97 (06/05 1201) Resp:  [14-20] 14 (06/05 1201) BP: (97-111)/(54-64) 97/54 (06/05 1201) SpO2:  [95 %-97 %] 95 % (06/05 1201) Last BM Date: 10/15/18  Weight change: Filed Weights   10/16/18 0500 10/17/18 0741 10/17/18 1135  Weight: 81.7 kg 82.7 kg 80 kg    Intake/Output:   Intake/Output Summary (Last 24 hours) at 10/18/2018 1448 Last data filed at 10/17/2018 1820 Gross per 24 hour  Intake 320 ml  Output --  Net 320 ml      Physical Exam    General:  Elderly. No resp difficulty HEENT: normal Neck: supple. JVP 6 . Carotids 2+ bilat; no bruits. No lymphadenopathy or  thyromegaly appreciated. Cor: PMI nondisplaced. Regular rate & rhythm. No rubs, gallops or  murmurs. Lungs: clear Abdomen: soft, nontender, nondistended. No hepatosplenomegaly. No bruits or masses. Good bowel sounds. Extremities: no cyanosis, clubbing, rash, edema Neuro: alert & orientedx3, cranial nerves grossly intact. moves all 4 extremities w/o difficulty. Affect pleasant   Telemetry   NSR 90 Personally reviewed  EKG    Sinus Tach 118 bpm QRS 148 Ashley  Labs   Basic Metabolic Panel: Recent Labs  Lab 10/14/18 1214 10/15/18 0643 10/16/18 0216 10/16/18 1528 10/16/18 1529 10/17/18 0320 10/18/18 0229  NA 139 139 136 140 136 139 139  K 3.8 3.6 4.1 3.2* 3.1* 3.7 4.0  CL 103 98 98  --   --  101 102  CO2 23 25 21*  --   --  25 24  GLUCOSE 100* 105* 133*  --   --  95 108*  BUN 19 24* 33*  --   --  29* 33*  CREATININE 1.20* 1.25* 1.20*  --   --  1.11* 1.20*  CALCIUM 10.0 9.5 9.6  --   --  9.3 9.6    Liver Function Tests: No results for input(s): AST, ALT, ALKPHOS, BILITOT, PROT, ALBUMIN in the last 168 hours. No results for input(s): LIPASE, AMYLASE in the last 168 hours. No results for input(s): AMMONIA in the last 168 hours.  CBC: Recent Labs  Lab 10/14/18 1214 10/14/18 2339 10/16/18 1528 10/16/18 1529  WBC 16.4* 20.8*  --   --   NEUTROABS  --  12.6*  --   --   HGB 12.6 13.0 12.6 12.6  HCT 39.5 40.1 37.0 37.0  MCV 93.6 91.8  --   --   PLT 182 196  --   --     Cardiac Enzymes: Recent Labs  Lab 10/14/18 1214 10/14/18 1950 10/14/18 2339 10/15/18 0643  TROPONINI <0.03 0.03* 0.04* 0.03*    BNP: BNP (last 3 results) Recent Labs    10/14/18 1214  BNP 774.4*    ProBNP (last 3 results) No results for input(s): PROBNP in the last 8760 hours.   CBG: Recent Labs  Lab 10/15/18 0718  GLUCAP 132*    Coagulation Studies: No results for input(s): LABPROT, INR in the last 72 hours.   Imaging    No results found.   Medications:     Current  Medications:  aspirin EC  81 mg Oral QHS   carvedilol  3.125 mg Oral BID WC   enoxaparin (LOVENOX) injection  40 mg Subcutaneous Q24H   ezetimibe  10 mg Oral QHS   furosemide  20 mg Intravenous Q12H   guaiFENesin  600 mg Oral Daily   mouth rinse  15 mL Mouth Rinse BID   omega-3 acid ethyl esters  1 g Oral Weekly   traMADol  50 mg Oral Once     Infusions:  sodium chloride         Assessment/Plan   1. A/C Systolic HF, Medtronic ICD single chamber. - h/o NICM dating back to 2012 with fluctuating EF - ECHO has gone down form 55% in 2019 to ~15-20% thsi admit. LHC/RHC with normal coronaries and mildly elevated filling pressures.  - QRS wide > 120. Discussed with Chanetta Marshall EP will set up for outpatient follow up and possible upgrade to CRT-D. She has been diuresed.  Continue low dose carvedilol.  SBP soft no room for entresto/spiro   2. LBBB QRS wide [address]  See above.   3. H/O Syncope  She has seen Dr Caryl Comes back in 2019.Marland Kitchen Device interrogation  at that time did not show arrhythmias.  2020 event thought to be vasovagal.    4. MGUS Followed by [facility]ematology for elevated IgM but no M spike.   Length of Stay: 4  Amy Clegg, NP  10/18/2018, 2:48 PM  Advanced Heart Failure Team Pager [phone] (M-F; 7a - 4p)  Please contact [facility] for night-coverage after hours (4p -7a ) and weekends on [email or web address]  Patient seen and examined with the above-signed Advanced Practice Provider and/or Housestaff. I personally reviewed laboratory data, imaging studies and relevant notes. I independently examined the patient and formulated the important aspects of the plan. I have edited the note to reflect any of my changes or salient points. I have personally discussed the plan with the patient and/or family.  73 y/o woman with obesity, MGUS, fibromyalgia.   Has h/o NICM dating back to 2012. EF has fluctuated between 25-50%. Has also had intermittent LBBB. Underwent single  chamber MDT ICD in 2013 with Dr. Lovena Le.   Last EF was 55% in 12/2017. Has been maintained on low dose b-blocker, losartan and spiro by Dr. [name] as BP tolerated. has not needed lasix. Over past few months has been very fatigued with NYHA III-IIIB symptoms. Admitted with HF flare. Was diuresed and underwent R/L cath as above with normal coronaries and well-compensated filling pressures. On ECG and tele now with chronic LBBB at 148 [address]  Echo now with EF 20% with normal RV  On exam  General:  Well appearing. No resp difficulty HEENT: normal Neck: supple. no JVD. Carotids 2+ bilat; no bruits. No lymphadenopathy or thryomegaly appreciated. Cor: PMI nondisplaced. Regular rate & rhythm. No rubs, gallops or murmurs. Lungs: clear Abdomen: obese soft, nontender, nondistended. No hepatosplenomegaly. No bruits or masses. Good bowel sounds. Extremities: no cyanosis, clubbing, rash, edema Neuro: alert & orientedx3, cranial nerves grossly intact. moves all 4 extremities w/o difficulty. Affect pleasant  She has longstanding, intermittent severe NICM of uncertain etiology. (Unable to get cMRI with ICD). Now EF down as low as it has ever been with NYHA III-IIIB symptoms. I am concerned that she may not bounce back this time.   Would plan the following: 1) Ok to d/c tomorrow on carvedilol 3.125, spiro 12.5 and prn lasix (hold losartan) due to LBBB 2) Continue close f/u with Dr. [name] (I spoke to him this evening by phone) 3) F/u with Dr. Lovena Le in near future for upgrade to CRT-D (we discussed with Chanetta Marshall, NP in EP) 4) If EF not recovering in 3 months or symptoms deteriorate will need referral back to HF Clinic for VAD w/u. We discussed this and she would be interested in proceeding if we felt it could help her.   Glori Bickers, MD  7:07 PM

## 2018-10-19 LAB — BASIC METABOLIC PANEL
Anion gap: 14 (ref 5–15)
BUN: 36 mg/dL — ABNORMAL HIGH (ref 8–23)
CO2: 23 mmol/L (ref 22–32)
Calcium: 9.4 mg/dL (ref 8.9–10.3)
Chloride: 100 mmol/L (ref 98–111)
Creatinine, Ser: 1.18 mg/dL — ABNORMAL HIGH (ref 0.44–1.00)
GFR calc Af Amer: 53 mL/min — ABNORMAL LOW (ref >60)
GFR calc non Af Amer: 46 mL/min — ABNORMAL LOW (ref >60)
Glucose, Bld: 115 mg/dL — ABNORMAL HIGH (ref 70–99)
Potassium: 3.8 mmol/L (ref 3.5–5.1)
Sodium: 137 mmol/L (ref 135–145)

## 2018-10-19 LAB — CBC
HCT: 42.2 % (ref 36.0–46.0)
Hemoglobin: 13.9 g/dL (ref 12.0–15.0)
MCH: 30.2 pg (ref 26.0–34.0)
MCHC: 32.9 g/dL (ref 30.0–36.0)
MCV: 91.7 fL (ref 80.0–100.0)
Platelets: 199 10*3/uL (ref 150–400)
RBC: 4.6 MIL/uL (ref 3.87–5.11)
RDW: 12.7 % (ref 11.5–15.5)
WBC: 13.1 10*3/uL — ABNORMAL HIGH (ref 4.0–10.5)
nRBC: 0 % (ref 0.0–0.2)

## 2018-10-19 MED ORDER — SPIRONOLACTONE 25 MG PO TABS: 12.5000 mg | ORAL_TABLET | Freq: Every day | ORAL | 0 refills | Status: DC

## 2018-10-19 MED ORDER — FUROSEMIDE 40 MG PO TABS: 40.0000 mg | ORAL_TABLET | Freq: Two times a day (BID) | ORAL | 0 refills | Status: DC | PRN

## 2018-10-19 MED ORDER — BENZONATATE 200 MG PO CAPS: 200.0000 mg | ORAL_CAPSULE | Freq: Three times a day (TID) | ORAL | 0 refills | Status: DC | PRN

## 2018-10-19 MED ORDER — CARVEDILOL 3.125 MG PO TABS: 3.1250 mg | ORAL_TABLET | Freq: Two times a day (BID) | ORAL | 0 refills | Status: DC

## 2018-10-19 NOTE — Discharge Summary (Signed)
Physician Discharge Summary  Ashley [name] GNF:[id number] DOB: Aug 23, 1945 DOA: 10/14/2018  PCP: [name], MD  Admit date: 10/14/2018 Discharge date: 10/19/2018  Admitted From: Home Disposition:  Home  Recommendations for Outpatient Follow-up:  1. Follow up with PCP in 1-2 weeks 2. Follow up with Dr. Lovena Le as scheduled 3. Follow up with Dr. [name] as scheduled  Discharge Condition:Stable CODE STATUS:Full Diet recommendation: Heart healthy   Brief/Interim Summary: 73 year old female with medical history significant for obstructive sleep apnea, nonischemic cardiomyopathy with AICD, COPD, hypertension and hyperlipidemia; who presented to the emergency department secondary to chest discomfort and shortness of breath. Recently treated as an outpatient for concern of COPD exacerbation with prednisone and antibiotic without significant improvement in her symptoms. Elevated BNP and vascular congestion appreciated on chest x-ray. Patient has been admitted for further evaluation and management of CHF exacerbation and chest discomfort.  Discharge Diagnoses:  Principal Problem:   Acute on chronic combined systolic and diastolic CHF (congestive heart failure) (HCC) Active Problems:   Dyslipidemia   Acute hypotension   CKD (chronic kidney disease) stage 3, GFR 30-59 ml/min (HCC)   ICD (implantable cardioverter-defibrillator) in place   Acute respiratory distress   1-Acute on chronic combined systolic and diastolic CHF (congestive heart failure) (HCC) -Continue low-sodium diet, daily weight and strict I's and O's -Patient on beta-blockerandARB -Bedside echo in ED for cardiology service demonstrated once again decreasing her ejection fraction. -Cardiology following, s/p R heart cath 6/3 with findings concerning for continued vol overload -Discussed case with Cardiology and Heart Failure Team. Plan for discharge with coreg 3.125mg , spironolactone 12.5mg  and PRN lasix, holding  losartan  2-acutesymptomatichypotension -In the setting of aggressive diuresis, improved with gentle IVF hydration -updated cardiac meds per above  3-mixed HLD -continue statins and zetia -Remains stable at this time  4-CKD (chronic kidney disease) stage 3, GFR 30-59 ml/min ([facility]) -Cr remains stable currently. Labs reviewed  5-ICD (implantable cardioverter-defibrillator) in place -appears to be working as intended -HR stable at this time  6-GERD -continue PPI -Presently stable  7-COPD -currently no wheezing  -continue PRN albuterol as needed   Discharge Instructions   Allergies as of 10/19/2018      Reactions   Antihistamines, Chlorpheniramine-type Other (See Comments)   Alters vision   Codeine Other (See Comments)   Confusion and dizziness   Cyclobenzaprine Other (See Comments)   Adverse reaction - back and throat pain, dizziness, exhaustion   Cymbalta [duloxetine Hcl] Other (See Comments)   "body spasms and made me feel weird in my head"    Influenza Vac Split [flu Virus Vaccine] Other (See Comments)   "allergic to concentrated eggs that are in vaccine"   Prevnar [pneumococcal 13-val Conj Vacc] Other (See Comments)   Egg allergy Bad reaction after shot   Saline Rash   FEVER, BURNING   Sulfasalazine Other (See Comments)   "makes my whole body smell like sulfa & makes me sick just smelling it"   Atorvastatin Other (See Comments)   Severe muscle cramps to generic atorvastatin.  Able to take brand lipitor.   Chlorhexidine Other (See Comments)   blisters   Dilaudid [hydromorphone Hcl] Other (See Comments)   Feels like she is burning internally    Latex Nausea Only, Rash, Other (See Comments)   Pain and infection   Morphine And Related Other (See Comments)   Internally feels like she is on fire    Pravastatin Other (See Comments)   myalgias   Prevacid [lansoprazole] Other (See Comments)  Worsened GI side effects   Red Dye Nausea Only, Swelling    Rosuvastatin Other (See Comments)   Was not effective controlling lipids   Simvastatin Other (See Comments)   Leg cramps   Diflucan [fluconazole] Nausea And Vomiting   Onion    Diarrhea, GI distress, flared IBS   Other    NOTE: pt is able to take cephalosporins without reaction   Penicillins Other (See Comments)   CHILDHOOD ALLEGY/REACTION: "told 50 years ago I couldn't take it; might be immune to it", able to take amoxicillin Has patient had a PCN reaction causing immediate rash, facial/tongue/throat swelling, SOB or lightheadedness with hypotension: Unknown Has patient had a PCN reaction causing severe rash involving mucus membranes or skin necrosis: UnknowN Has patient had a PCN reaction that required hospitalization: /Unknown Has patient had a PCN reaction occurring within the last 10 years: Unknown If a   Sertraline Other (See Comments)   Overly sedating   Tape Other (See Comments)   All adhesives  - Blisters, itching and burning   Metaxalone Other (See Comments)   REACTION: unknown      Medication List    STOP taking these medications   losartan 25 MG tablet Commonly known as:  COZAAR   torsemide 10 MG tablet Commonly known as:  DEMADEX     TAKE these medications   albuterol 108 (90 Base) MCG/ACT inhaler Commonly known as:  VENTOLIN HFA TAKE 2 PUFFS BY MOUTH EVERY 6 HOURS AS NEEDED FOR WHEEZE OR SHORTNESS OF BREATH What changed:  See the new instructions.   AMBULATORY NON FORMULARY MEDICATION Medication Name:  1 squatty potty to use as needed   aspirin EC 81 MG tablet Take 81 mg by mouth at bedtime.   benzonatate 200 MG capsule Commonly known as:  TESSALON Take 1 capsule (200 mg total) by mouth 3 (three) times daily as needed for cough.   BIOTIN PO Take 1 tablet by mouth daily.   carvedilol 3.125 MG tablet Commonly known as:  COREG Take 1 tablet (3.125 mg total) by mouth 2 (two) times daily with a meal for 30 days. What changed:    medication  strength  how much to take   cholecalciferol 1000 units tablet Commonly known as:  VITAMIN D Take 2,000 Units by mouth daily.   CO Q 10 PO Take 1 tablet by mouth daily.   dextromethorphan 30 MG/5ML liquid Commonly known as:  DELSYM Take 15 mg by mouth at bedtime as needed for cough.   ezetimibe 10 MG tablet Commonly known as:  ZETIA TAKE 1 TABLET (10 MG TOTAL) BY MOUTH DAILY. What changed:  See the new instructions.   [employer] Mouthwash Susp Swish and swallow 66ml every 6 hours as needed   furosemide 40 MG tablet Commonly known as:  Lasix Take 1 tablet (40 mg total) by mouth 2 (two) times daily as needed for up to 30 days for fluid or edema.   GARLIC PO Take 1 tablet by mouth daily.   guaiFENesin 600 MG 12 hr tablet Commonly known as:  MUCINEX Take 600 mg by mouth daily.   levalbuterol 45 MCG/ACT inhaler Commonly known as:  Xopenex HFA Inhale 2 puffs into the lungs every 4 (four) hours as needed for wheezing or shortness of breath.   nystatin cream Commonly known as:  MYCOSTATIN Apply 1 application topically 2 (two) times daily. What changed:    when to take this  reasons to take this   omega-3 acid ethyl  esters 1 g capsule Commonly known as:  LOVAZA Take 1 g by mouth once a week. Monday   potassium chloride 10 MEQ tablet Commonly known as:  K-DUR Take 10 mEq by mouth once a week. Takes every 7-10 days   spironolactone 25 MG tablet Commonly known as:  ALDACTONE Take 0.5 tablets (12.5 mg total) by mouth daily for 30 days. Start taking on:  October 20, 2018 What changed:    how much to take  when to take this   TYLENOL 500 MG tablet Generic drug:  acetaminophen Take 500 mg by mouth daily as needed for moderate pain or headache (PAIN).   vitamin B-12 1000 MCG tablet Commonly known as:  CYANOCOBALAMIN Take 1 tablet (1,000 mcg total) by mouth daily.   vitamin C 1000 MG tablet Take 1,000 mg by mouth daily.   vitamin E 100 UNIT capsule Take 100  Units by mouth daily.      Follow-up Information    [name], MD. Schedule an appointment as soon as possible for a visit in 2 week(s).   Specialty:  Family Medicine Contact information: [place] [address] [phone]        Evans Lance, MD .   Specialty:  Cardiology Contact information: [phone] [address] [place] [address] [phone]        Minna Merritts, MD .   Specialty:  Cardiology Contact information: [address] [place] [address] [phone]          Allergies  Allergen Reactions  . Antihistamines, Chlorpheniramine-Type Other (See Comments)    Alters vision  . Codeine Other (See Comments)    Confusion and dizziness  . Cyclobenzaprine Other (See Comments)    Adverse reaction - back and throat pain, dizziness, exhaustion  . Cymbalta [Duloxetine Hcl] Other (See Comments)    "body spasms and made me feel weird in my head"   . Influenza Vac Split [Flu Virus Vaccine] Other (See Comments)    "allergic to concentrated eggs that are in vaccine"  . Prevnar [Pneumococcal 13-Val Conj Vacc] Other (See Comments)    Egg allergy Bad reaction after shot  . Saline Rash    FEVER, BURNING  . Sulfasalazine Other (See Comments)    "makes my whole body smell like sulfa & makes me sick just smelling it"  . Atorvastatin Other (See Comments)    Severe muscle cramps to generic atorvastatin.  Able to take brand lipitor.  . Chlorhexidine Other (See Comments)    blisters  . Dilaudid [Hydromorphone Hcl] Other (See Comments)    Feels like she is burning internally   . Latex Nausea Only, Rash and Other (See Comments)    Pain and infection  . Morphine And Related Other (See Comments)    Internally feels like she is on fire   . Pravastatin Other (See Comments)    myalgias  . Prevacid [Lansoprazole] Other (See Comments)    Worsened GI side effects  . Red Dye Nausea Only and Swelling  .  Rosuvastatin Other (See Comments)    Was not effective controlling lipids  . Simvastatin Other (See Comments)    Leg cramps  . Diflucan [Fluconazole] Nausea And Vomiting  . Onion     Diarrhea, GI distress, flared IBS  . Other     NOTE: pt is able to take cephalosporins without reaction  . Penicillins Other (See Comments)    CHILDHOOD ALLEGY/REACTION: "told 50 years  ago I couldn't take it; might be immune to it", able to take amoxicillin Has patient had a PCN reaction causing immediate rash, facial/tongue/throat swelling, SOB or lightheadedness with hypotension: Unknown Has patient had a PCN reaction causing severe rash involving mucus membranes or skin necrosis: UnknowN Has patient had a PCN reaction that required hospitalization: /Unknown Has patient had a PCN reaction occurring within the last 10 years: Unknown If a  . Sertraline Other (See Comments)    Overly sedating  . Tape Other (See Comments)    All adhesives  - Blisters, itching and burning   . Metaxalone Other (See Comments)    REACTION: unknown    Consultations:  Cardiology  Heart Failure Team  Procedures/Studies: Dg Chest 2 View  Result Date: 10/14/2018 CLINICAL DATA:  Worsening chest pain and shortness of breath. EXAM: CHEST - 2 VIEW COMPARISON:  Chest x-ray dated December 29, 2017. FINDINGS: Unchanged left chest wall pacemaker. Stable borderline cardiomegaly. Normal mediastinal contours. Pulmonary vascular congestion with mild interstitial thickening. No focal consolidation, pleural effusion, or pneumothorax. No acute osseous abnormality. IMPRESSION: 1. Mild interstitial pulmonary edema. Electronically Signed   By: Titus Dubin M.D.   On: 10/14/2018 11:07    Subjective: Eager to go home  Discharge Exam: Vitals:   10/19/18 0414 10/19/18 0800  BP:  104/65  Pulse:  80  Resp:  15  Temp: 97.8 F (36.6 C) 98.6 F (37 C)  SpO2:  96%   Vitals:   10/18/18 2235 10/19/18 0414 10/19/18 0415 10/19/18 0800  BP:     104/65  Pulse:    80  Resp:    15  Temp: 98.2 F (36.8 C) 97.8 F (36.6 C)  98.6 F (37 C)  TempSrc: Oral Oral  Oral  SpO2:    96%  Weight:   80.2 kg   Height:        General: Pt is alert, awake, not in acute distress Cardiovascular: RRR, S1/S2 +, no rubs, no gallops Respiratory: CTA bilaterally, no wheezing, no rhonchi Abdominal: Soft, NT, ND, bowel sounds + Extremities: no edema, no cyanosis   The results of significant diagnostics from this hospitalization (including imaging, microbiology, ancillary and laboratory) are listed below for reference.     Microbiology: Recent Results (from the past 240 hour(s))  SARS Coronavirus 2 (CEPHEID - Performed in [place] [facility] hospital lab), Hosp Order     Status: None   Collection Time: 10/14/18 12:15 PM  Result Value Ref Range Status   SARS Coronavirus 2 NEGATIVE NEGATIVE Final    Comment: (NOTE) If result is NEGATIVE SARS-CoV-2 target nucleic acids are NOT DETECTED. The SARS-CoV-2 RNA is generally detectable in upper and lower  respiratory specimens during the acute phase of infection. The lowest  concentration of SARS-CoV-2 viral copies this assay can detect is 250  copies / mL. A negative result does not preclude SARS-CoV-2 infection  and should not be used as the sole basis for treatment or other  patient management decisions.  A negative result may occur with  improper specimen collection / handling, submission of specimen other  than nasopharyngeal swab, presence of viral mutation(s) within the  areas targeted by this assay, and inadequate number of viral copies  (<250 copies / mL). A negative result must be combined with clinical  observations, patient history, and epidemiological information. If result is POSITIVE SARS-CoV-2 target nucleic acids are DETECTED. The SARS-CoV-2 RNA is generally detectable in upper and lower  respiratory specimens dur ing the acute phase  of infection.  Positive  results are indicative of  active infection with SARS-CoV-2.  Clinical  correlation with patient history and other diagnostic information is  necessary to determine patient infection status.  Positive results do  not rule out bacterial infection or co-infection with other viruses. If result is PRESUMPTIVE POSTIVE SARS-CoV-2 nucleic acids MAY BE PRESENT.   A presumptive positive result was obtained on the submitted specimen  and confirmed on repeat testing.  While 2019 novel coronavirus  (SARS-CoV-2) nucleic acids may be present in the submitted sample  additional confirmatory testing may be necessary for epidemiological  and / or clinical management purposes  to differentiate between  SARS-CoV-2 and other Sarbecovirus currently known to infect humans.  If clinically indicated additional testing with an alternate test  methodology [phone]) is advised. The SARS-CoV-2 RNA is generally  detectable in upper and lower respiratory sp ecimens during the acute  phase of infection. The expected result is Negative. Fact Sheet for Patients:  StrictlyIdeas.no Fact Sheet for Healthcare Providers: BankingDealers.co.za This test is not yet approved or cleared by the [place] FDA and has been authorized for detection and/or diagnosis of SARS-CoV-2 by FDA under an Emergency Use Authorization (EUA).  This EUA will remain in effect (meaning this test can be used) for the duration of the COVID-19 declaration under Section 564(b)(1) of the Act, 21 U.S.C. section 360bbb-3(b)(1), unless the authorization is terminated or revoked sooner. Performed at [facility], [place] [address] [place], [place] [address]   MRSA PCR Screening     Status: None   Collection Time: 10/14/18  7:59 PM  Result Value Ref Range Status   MRSA by PCR NEGATIVE NEGATIVE Final    Comment:        The GeneXpert MRSA Assay (FDA approved for NASAL specimens only), is one component of a comprehensive MRSA  colonization surveillance program. It is not intended to diagnose MRSA infection nor to guide or monitor treatment for MRSA infections. Performed at [facility], [place] [address], [place], [place] [address]      Labs: BNP (last 3 results) Recent Labs    10/14/18 1214  BNP 253.6*   Basic Metabolic Panel: Recent Labs  Lab 10/15/18 0643 10/16/18 0216 10/16/18 1528 10/16/18 1529 10/17/18 0320 10/18/18 0229 10/19/18 0228  NA 139 136 140 136 139 139 137  K 3.6 4.1 3.2* 3.1* 3.7 4.0 3.8  CL 98 98  --   --  101 102 100  CO2 25 21*  --   --  25 24 23   GLUCOSE 105* 133*  --   --  95 108* 115*  BUN 24* 33*  --   --  29* 33* 36*  CREATININE 1.25* 1.20*  --   --  1.11* 1.20* 1.18*  CALCIUM 9.5 9.6  --   --  9.3 9.6 9.4   Liver Function Tests: No results for input(s): AST, ALT, ALKPHOS, BILITOT, PROT, ALBUMIN in the last 168 hours. No results for input(s): LIPASE, AMYLASE in the last 168 hours. No results for input(s): AMMONIA in the last 168 hours. CBC: Recent Labs  Lab 10/14/18 1214 10/14/18 2339 10/16/18 1528 10/16/18 1529 10/19/18 0228  WBC 16.4* 20.8*  --   --  13.1*  NEUTROABS  --  12.6*  --   --   --   HGB 12.6 13.0 12.6 12.6 13.9  HCT 39.5 40.1 37.0 37.0 42.2  MCV 93.6 91.8  --   --  91.7  PLT 182 196  --   --  199   Cardiac Enzymes: Recent Labs  Lab 10/14/18 1214 10/14/18 1950 10/14/18 2339 10/15/18 0643  TROPONINI <0.03 0.03* 0.04* 0.03*   BNP: Invalid input(s): POCBNP CBG: Recent Labs  Lab 10/15/18 0718  GLUCAP 132*   D-Dimer No results for input(s): DDIMER in the last 72 hours. Hgb A1c No results for input(s): HGBA1C in the last 72 hours. Lipid Profile No results for input(s): CHOL, HDL, LDLCALC, TRIG, CHOLHDL, LDLDIRECT in the last 72 hours. Thyroid function studies No results for input(s): TSH, T4TOTAL, T3FREE, THYROIDAB in the last 72 hours.  Invalid input(s): FREET3 Anemia work up No results for input(s): VITAMINB12, FOLATE,  FERRITIN, TIBC, IRON, RETICCTPCT in the last 72 hours. Urinalysis    Component Value Date/Time   COLORURINE STRAW (A) 12/29/2017 1520   APPEARANCEUR CLEAR 12/29/2017 1520   LABSPEC 1.008 12/29/2017 1520   PHURINE 5.0 12/29/2017 1520   GLUCOSEU NEGATIVE 12/29/2017 1520   HGBUR NEGATIVE 12/29/2017 1520   BILIRUBINUR negative 08/05/2018 1416   KETONESUR NEGATIVE 12/29/2017 1520   PROTEINUR Negative 08/05/2018 1416   PROTEINUR NEGATIVE 12/29/2017 1520   UROBILINOGEN 0.2 08/05/2018 1416   UROBILINOGEN 0.2 05/27/2014 1952   NITRITE negative 08/05/2018 1416   NITRITE NEGATIVE 12/29/2017 1520   LEUKOCYTESUR Negative 08/05/2018 1416   Sepsis Labs Invalid input(s): PROCALCITONIN,  WBC,  LACTICIDVEN Microbiology Recent Results (from the past 240 hour(s))  SARS Coronavirus 2 (CEPHEID - Performed in [place] hospital lab), Hosp Order     Status: None   Collection Time: 10/14/18 12:15 PM  Result Value Ref Range Status   SARS Coronavirus 2 NEGATIVE NEGATIVE Final    Comment: (NOTE) If result is NEGATIVE SARS-CoV-2 target nucleic acids are NOT DETECTED. The SARS-CoV-2 RNA is generally detectable in upper and lower  respiratory specimens during the acute phase of infection. The lowest  concentration of SARS-CoV-2 viral copies this assay can detect is 250  copies / mL. A negative result does not preclude SARS-CoV-2 infection  and should not be used as the sole basis for treatment or other  patient management decisions.  A negative result may occur with  improper specimen collection / handling, submission of specimen other  than nasopharyngeal swab, presence of viral mutation(s) within the  areas targeted by this assay, and inadequate number of viral copies  (<250 copies / mL). A negative result must be combined with clinical  observations, patient history, and epidemiological information. If result is POSITIVE SARS-CoV-2 target nucleic acids are DETECTED. The SARS-CoV-2 RNA is generally  detectable in upper and lower  respiratory specimens dur ing the acute phase of infection.  Positive  results are indicative of active infection with SARS-CoV-2.  Clinical  correlation with patient history and other diagnostic information is  necessary to determine patient infection status.  Positive results do  not rule out bacterial infection or co-infection with other viruses. If result is PRESUMPTIVE POSTIVE SARS-CoV-2 nucleic acids MAY BE PRESENT.   A presumptive positive result was obtained on the submitted specimen  and confirmed on repeat testing.  While 2019 novel coronavirus  (SARS-CoV-2) nucleic acids may be present in the submitted sample  additional confirmatory testing may be necessary for epidemiological  and / or clinical management purposes  to differentiate between  SARS-CoV-2 and other Sarbecovirus currently known to infect humans.  If clinically indicated additional testing with an alternate test  methodology [phone]) is advised. The SARS-CoV-2 RNA is generally  detectable in upper  and lower respiratory sp ecimens during the acute  phase of infection. The expected result is Negative. Fact Sheet for Patients:  StrictlyIdeas.no Fact Sheet for Healthcare Providers: BankingDealers.co.za This test is not yet approved or cleared by the [place] FDA and has been authorized for detection and/or diagnosis of SARS-CoV-2 by FDA under an Emergency Use Authorization (EUA).  This EUA will remain in effect (meaning this test can be used) for the duration of the COVID-19 declaration under Section 564(b)(1) of the Act, 21 U.S.C. section 360bbb-3(b)(1), unless the authorization is terminated or revoked sooner. Performed at [facility], [place] [address] [place], [place] [address]   MRSA PCR Screening     Status: None   Collection Time: 10/14/18  7:59 PM  Result Value Ref Range Status   MRSA by PCR NEGATIVE NEGATIVE Final     Comment:        The GeneXpert MRSA Assay (FDA approved for NASAL specimens only), is one component of a comprehensive MRSA colonization surveillance program. It is not intended to diagnose MRSA infection nor to guide or monitor treatment for MRSA infections. Performed at [facility], [place] [address] [place], [place] [address]    Time spent: 30 min  SIGNED:   Marylu Lund, MD  [facility] 10/19/2018, 4:43 PM  If 7PM-7AM, please contact night-coverage

## 2018-10-19 NOTE — Progress Notes (Signed)
Patient was discharged home by MD order; discharged instructions  review and give to patient with care notes and prescription; IV DIC; skin intact; patient will be escorted to the car by nurse tech via wheelchair.  

## 2018-10-21 ENCOUNTER — Encounter: Payer: Self-pay | Admitting: Family Medicine

## 2018-10-21 ENCOUNTER — Telehealth: Payer: Self-pay | Admitting: Internal Medicine

## 2018-10-21 ENCOUNTER — Telehealth: Payer: Self-pay | Admitting: Cardiovascular Disease

## 2018-10-21 ENCOUNTER — Ambulatory Visit (INDEPENDENT_AMBULATORY_CARE_PROVIDER_SITE_OTHER): Payer: Medicare Other | Admitting: Family Medicine

## 2018-10-21 VITALS — BP 117/72 | HR 101 | Ht 65.0 in | Wt 172.0 lb

## 2018-10-21 DIAGNOSIS — I447 Left bundle-branch block, unspecified: Secondary | ICD-10-CM | POA: Diagnosis not present

## 2018-10-21 DIAGNOSIS — R5381 Other malaise: Secondary | ICD-10-CM

## 2018-10-21 DIAGNOSIS — I5022 Chronic systolic (congestive) heart failure: Secondary | ICD-10-CM | POA: Diagnosis not present

## 2018-10-21 DIAGNOSIS — R5382 Chronic fatigue, unspecified: Secondary | ICD-10-CM | POA: Diagnosis not present

## 2018-10-21 DIAGNOSIS — N183 Chronic kidney disease, stage 3 unspecified: Secondary | ICD-10-CM

## 2018-10-21 DIAGNOSIS — I420 Dilated cardiomyopathy: Secondary | ICD-10-CM

## 2018-10-21 DIAGNOSIS — R1084 Generalized abdominal pain: Secondary | ICD-10-CM

## 2018-10-21 DIAGNOSIS — R0602 Shortness of breath: Secondary | ICD-10-CM

## 2018-10-21 DIAGNOSIS — Z95810 Presence of automatic (implantable) cardiac defibrillator: Secondary | ICD-10-CM | POA: Diagnosis not present

## 2018-10-21 DIAGNOSIS — G8929 Other chronic pain: Secondary | ICD-10-CM

## 2018-10-21 DIAGNOSIS — I5043 Acute on chronic combined systolic (congestive) and diastolic (congestive) heart failure: Secondary | ICD-10-CM

## 2018-10-21 DIAGNOSIS — I051 Rheumatic mitral insufficiency: Secondary | ICD-10-CM

## 2018-10-21 DIAGNOSIS — I428 Other cardiomyopathies: Secondary | ICD-10-CM

## 2018-10-21 NOTE — Progress Notes (Signed)
Virtual visit completed through Doxy.Me. Due to national recommendations of social distancing due to COVID-19, a virtual visit is felt to be most appropriate for this patient at this time. Reviewed limitations of a virtual visit.   Patient location: home Provider location: [place] at [facility], office If any vitals were documented, they were collected by patient at home unless specified below.    BP 117/72 Comment: pt reprt  Pulse (!) 101 Comment: pt report  Ht 5\' 5"  (1.651 m)   Wt 172 lb (78 kg)   BMI 28.62 kg/m    CC: hosp f/u visit Subjective:    Patient ID: Ashley [name], female    DOB: 02-18-1946, 73 y.o.   MRN: [id number]  HPI: Ashley [name] is a 73 y.o. female presenting on 10/21/2018 for Hospitalization Follow-up (folloq up from ED--c/o of weakness--saw heart doctor already)   Recent complicated hospitalization for ongoing dyspnea with chest discomfort not responding to outpatient bronchitis treatment with levaquin and prednisone. 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 Medications were changed including restarting lasix (prior on torsemide) along with holding losartan. She had R heart catheterization with non obstructive coronaries and EF 20-25%. Widened QRS found. Discussing upgrading pacemaker to implantable cardiac resynchronization therapy defibrillator - planned EP f/u outpatient and consideration for ventricular assist device.   EP virtual appt scheduled Lovena Le) 10/31/2018 to discuss updated pacemaker.   Since home, not feeling great.   Admit date: 10/14/2018 Discharge date: 10/19/2018 TCM hosp f/u phone call: pt was seen within 2 business days of discharge from hospital.  Admitted From: Home Disposition:  Home  Recommendations for Outpatient Follow-up:  1. Follow up with PCP in 1-2 weeks 2. Follow up with Dr. Lovena Le as scheduled 3. Follow up with Dr. [name] as scheduled  Discharge Condition:Stable  CODE STATUS:Full Diet recommendation: Heart healthy  Discharge Diagnoses:  Principal Problem:   Acute on chronic combined systolic and diastolic CHF (congestive heart failure) (HCC) Active Problems:   Dyslipidemia   Acute hypotension   CKD (chronic kidney disease) stage 3, GFR 30-59 ml/min (HCC)   ICD (implantable cardioverter-defibrillator) in place   Acute respiratory distress     Relevant past medical, surgical, family and social history reviewed and updated as indicated. Interim medical history since our last visit reviewed. Allergies and medications reviewed and updated. Discharge medications reconciled with home medication list.  Outpatient Medications Prior to Visit  Medication Sig Dispense Refill  . acetaminophen (TYLENOL) 500 MG tablet Take 500 mg by mouth daily as needed for moderate pain or headache (PAIN).     Marland Kitchen albuterol (PROVENTIL HFA;VENTOLIN HFA) 108 (90 Base) MCG/ACT inhaler TAKE 2 PUFFS BY MOUTH EVERY 6 HOURS AS NEEDED FOR WHEEZE OR SHORTNESS OF BREATH (Patient taking differently: Inhale 2 puffs into the lungs every 6 (six) hours as needed for wheezing or shortness of breath. ) 18 Inhaler 1  . AMBULATORY NON FORMULARY MEDICATION Medication Name:  1 squatty potty to use as needed 1 each 0  . Ascorbic Acid (VITAMIN C) 1000 MG tablet Take 1,000 mg by mouth daily.    Marland Kitchen aspirin EC 81 MG tablet Take 81 mg by mouth at bedtime.     . benzonatate (TESSALON) 200 MG capsule Take 1 capsule (200 mg total) by mouth 3 (three) times daily as needed for cough. 20 capsule 0  . BIOTIN PO Take 1 tablet by mouth daily.    . carvedilol (COREG) 3.125 MG  tablet Take 1 tablet (3.125 mg total) by mouth 2 (two) times daily with a meal for 30 days. 60 tablet 0  . cholecalciferol (VITAMIN D) 1000 units tablet Take 2,000 Units by mouth daily.    . Coenzyme Q10 (CO Q 10 PO) Take 1 tablet by mouth daily.    Marland Kitchen dextromethorphan (DELSYM) 30 MG/5ML liquid Take 15 mg by mouth at bedtime as needed for  cough.    . Diphenhyd-Hydrocort-Nystatin (FIRST-DUKES MOUTHWASH) SUSP Swish and swallow 79ml every 6 hours as needed 240 mL 0  . ezetimibe (ZETIA) 10 MG tablet TAKE 1 TABLET (10 MG TOTAL) BY MOUTH DAILY. (Patient taking differently: Take 10 mg by mouth at bedtime. ) 90 tablet 2  . furosemide (LASIX) 40 MG tablet Take 1 tablet (40 mg total) by mouth 2 (two) times daily as needed for up to 30 days for fluid or edema. 60 tablet 0  . GARLIC PO Take 1 tablet by mouth daily.    Marland Kitchen guaiFENesin (MUCINEX) 600 MG 12 hr tablet Take 600 mg by mouth daily.    Marland Kitchen levalbuterol (XOPENEX HFA) 45 MCG/ACT inhaler Inhale 2 puffs into the lungs every 4 (four) hours as needed for wheezing or shortness of breath. 1 Inhaler 3  . nystatin cream (MYCOSTATIN) Apply 1 application topically 2 (two) times daily. (Patient taking differently: Apply 1 application topically as needed (yeast). ) 60 g 1  . omega-3 acid ethyl esters (LOVAZA) 1 g capsule Take 1 g by mouth once a week. Monday    . potassium chloride (K-DUR) 10 MEQ tablet Take 10 mEq by mouth once a week. Takes every 7-10 days    . spironolactone (ALDACTONE) 25 MG tablet Take 0.5 tablets (12.5 mg total) by mouth daily for 30 days. 15 tablet 0  . vitamin B-12 (CYANOCOBALAMIN) 1000 MCG tablet Take 1 tablet (1,000 mcg total) by mouth daily.    . vitamin E 100 UNIT capsule Take 100 Units by mouth daily.     No facility-administered medications prior to visit.      Per HPI unless specifically indicated in ROS section below Review of Systems Objective:    BP 117/72 Comment: pt reprt  Pulse (!) 101 Comment: pt report  Ht 5\' 5"  (1.651 m)   Wt 172 lb (78 kg)   BMI 28.62 kg/m   Wt Readings from Last 3 Encounters:  10/21/18 172 lb (78 kg)  10/19/18 176 lb 12.9 oz (80.2 kg)  10/09/18 177 lb 7 oz (80.5 kg)     Physical exam: Gen: alert, NAD, not ill appearing Pulm: speaks in complete sentences without increased work of breathing Psych: normal mood, normal thought  content      Lab Results  Component Value Date   CREATININE 1.18 (H) 10/19/2018   BUN 36 (H) 10/19/2018   NA 137 10/19/2018   K 3.8 10/19/2018   CL 100 10/19/2018   CO2 23 10/19/2018    Assessment & Plan:   Problem List Items Addressed This Visit    Nonischemic cardiomyopathy ([place])   Mitral regurgitation    Acute worsening - now severe during recent hospitalization.       LBBB (left bundle branch block)   ICD (implantable cardioverter-defibrillator) in place   Dyspnea   CKD (chronic kidney disease) stage 3, GFR 30-59 ml/min (HCC)   Chronic systolic CHF (congestive heart failure) (HCC)   Chronic generalized abdominal pain   Chronic fatigue and malaise   Cardiomyopathy, dilated, nonischemic (HCC)    Acute  worsening of NICM during recent hospitalization.       Acute on chronic combined systolic and diastolic CHF (congestive heart failure) ([place]) - Primary    Acute worsening found on recent hospitalization, appreciate cardiology, CHF clinic and EP care. Discussing upgrading pacemaker/defibrillator and if EF remains low considering VAD.  Reviewed discharge medication changes from the hospital, clarified diuretic dosing.           No orders of the defined types were placed in this encounter.  No orders of the defined types were placed in this encounter.   I discussed the assessment and treatment plan with the patient. The patient was provided an opportunity to ask questions and all were answered. The patient agreed with the plan and demonstrated an understanding of the instructions. The patient was advised to call back or seek an in-person evaluation if the symptoms worsen or if the condition fails to improve as anticipated.  Follow up plan: No follow-ups on file.  [name], MD

## 2018-10-21 NOTE — Telephone Encounter (Signed)
Virtual Visit Pre-Appointment Phone Call  "(Name), I am calling you today to discuss your upcoming appointment. We are currently trying to limit exposure to the virus that causes COVID-19 by seeing patients at home rather than in the office."  1. "What is the BEST phone number to call the day of the visit?" - include this in appointment notes  2. Do you have or have access to (through a family member/friend) a smartphone with video capability that we can use for your visit?" a. If yes - list this number in appt notes as cell (if different from BEST phone #) and list the appointment type as a VIDEO visit in appointment notes b. If no - list the appointment type as a PHONE visit in appointment notes  3. Confirm consent - "In the setting of the current Covid19 crisis, you are scheduled for a (phone or video) visit with your provider on (date) at (time).  Just as we do with many in-office visits, in order for you to participate in this visit, we must obtain consent.  If you'd like, I can send this to your mychart (if signed up) or email for you to review.  Otherwise, I can obtain your verbal consent now.  All virtual visits are billed to your insurance company just like a normal visit would be.  By agreeing to a virtual visit, we'd like you to understand that the technology does not allow for your provider to perform an examination, and thus may limit your provider's ability to fully assess your condition. If your provider identifies any concerns that need to be evaluated in person, we will make arrangements to do so.  Finally, though the technology is pretty good, we cannot assure that it will always work on either your or our end, and in the setting of a video visit, we may have to convert it to a phone-only visit.  In either situation, we cannot ensure that we have a secure connection.  Are you willing to proceed?" STAFF: Did the patient verbally acknowledge consent to telehealth visit? Document  YES/NO here: YES  4. Advise patient to be prepared - "Two hours prior to your appointment, go ahead and check your blood pressure, pulse, oxygen saturation, and your weight (if you have the equipment to check those) and write them all down. When your visit starts, your provider will ask you for this information. If you have an Apple Watch or Kardia device, please plan to have heart rate information ready on the day of your appointment. Please have a pen and paper handy nearby the day of the visit as well."  5. Give patient instructions for MyChart download to smartphone OR [email or web address] as below if video visit (depending on what platform provider is using)  6. Inform patient they will receive a phone call 15 minutes prior to their appointment time (may be from unknown caller ID) so they should be prepared to answer    Ashley [name] has been deemed a candidate for a follow-up tele-health visit to limit community exposure during the Covid-19 pandemic. I spoke with the patient via phone to ensure availability of phone/video source, confirm preferred email & phone number, and discuss instructions and expectations.  I reminded Ashley [name] to be prepared with any vital sign and/or heart rhythm information that could potentially be obtained via home monitoring, at the time of her visit. I reminded Ashley [name] to expect a phone call prior to  her visit.  [name] 10/21/2018 9:20 AM   INSTRUCTIONS FOR DOWNLOADING THE MYCHART APP TO SMARTPHONE  - The patient must first make sure to have activated MyChart and know their login information - If Apple, go to [employer] and type in MyChart in the search bar and download the app. If Android, ask patient to go to [employer] and type in [place] in the search bar and download the app. The app is free but as with any other app downloads, their phone may require them to verify saved payment information or  Apple/Android password.  - The patient will need to then log into the app with their MyChart username and password, and select [facility] as their healthcare provider to link the account. When it is time for your visit, go to the MyChart app, find appointments, and click Begin Video Visit. Be sure to Select Allow for your device to access the Microphone and Camera for your visit. You will then be connected, and your provider will be with you shortly.  **If they have any issues connecting, or need assistance please contact MyChart service desk [phone])**  **If using a computer, in order to ensure the best quality for their visit they will need to use either of the following Internet Browsers: [employer], or Google Chrome**  IF USING DOXIMITY or DOXY.ME - The patient will receive a link just prior to their visit by text.     FULL LENGTH CONSENT FOR TELE-HEALTH VISIT   I hereby voluntarily request, consent and authorize [facility] and its employed or contracted physicians, physician assistants, nurse practitioners or other licensed health care professionals (the Practitioner), to provide me with telemedicine health care services (the Services") as deemed necessary by the treating Practitioner. I acknowledge and consent to receive the Services by the Practitioner via telemedicine. I understand that the telemedicine visit will involve communicating with the Practitioner through live audiovisual communication technology and the disclosure of certain medical information by electronic transmission. I acknowledge that I have been given the opportunity to request an in-person assessment or other available alternative prior to the telemedicine visit and am voluntarily participating in the telemedicine visit.  I understand that I have the right to withhold or withdraw my consent to the use of telemedicine in the course of my care at any time, without affecting my right to future care  or treatment, and that the Practitioner or I may terminate the telemedicine visit at any time. I understand that I have the right to inspect all information obtained and/or recorded in the course of the telemedicine visit and may receive copies of available information for a reasonable fee.  I understand that some of the potential risks of receiving the Services via telemedicine include:   Delay or interruption in medical evaluation due to technological equipment failure or disruption;  Information transmitted may not be sufficient (e.g. poor resolution of images) to allow for appropriate medical decision making by the Practitioner; and/or   In rare instances, security protocols could fail, causing a breach of personal health information.  Furthermore, I acknowledge that it is my responsibility to provide information about my medical history, conditions and care that is complete and accurate to the best of my ability. I acknowledge that Practitioner's advice, recommendations, and/or decision may be based on factors not within their control, such as incomplete or inaccurate data provided by me or distortions of diagnostic images or specimens that may result from electronic transmissions. I  understand that the practice of medicine is not an exact science and that Practitioner makes no warranties or guarantees regarding treatment outcomes. I acknowledge that I will receive a copy of this consent concurrently upon execution via email to the email address I last provided but may also request a printed copy by calling the office of [facility].    I understand that my insurance will be billed for this visit.   I have read or had this consent read to me.  I understand the contents of this consent, which adequately explains the benefits and risks of the Services being provided via telemedicine.   I have been provided ample opportunity to ask questions regarding this consent and the Services and have had  my questions answered to my satisfaction.  I give my informed consent for the services to be provided through the use of telemedicine in my medical care  By participating in this telemedicine visit I agree to the above.

## 2018-10-21 NOTE — Telephone Encounter (Signed)
Patient called and says she needs an appt to get her pacemaker upgraded.  Please advise

## 2018-10-22 ENCOUNTER — Telehealth (INDEPENDENT_AMBULATORY_CARE_PROVIDER_SITE_OTHER): Payer: Medicare Other | Admitting: Cardiovascular Disease

## 2018-10-22 ENCOUNTER — Other Ambulatory Visit: Payer: Self-pay

## 2018-10-22 ENCOUNTER — Telehealth: Payer: Self-pay | Admitting: Cardiovascular Disease

## 2018-10-22 DIAGNOSIS — I10 Essential (primary) hypertension: Secondary | ICD-10-CM

## 2018-10-22 DIAGNOSIS — E782 Mixed hyperlipidemia: Secondary | ICD-10-CM

## 2018-10-22 DIAGNOSIS — Z95810 Presence of automatic (implantable) cardiac defibrillator: Secondary | ICD-10-CM

## 2018-10-22 DIAGNOSIS — F411 Generalized anxiety disorder: Secondary | ICD-10-CM

## 2018-10-22 DIAGNOSIS — I420 Dilated cardiomyopathy: Secondary | ICD-10-CM

## 2018-10-22 DIAGNOSIS — I5022 Chronic systolic (congestive) heart failure: Secondary | ICD-10-CM | POA: Diagnosis not present

## 2018-10-22 MED ORDER — FUROSEMIDE 20 MG PO TABS: 20.0000 mg | ORAL_TABLET | ORAL | 3 refills | Status: DC | PRN

## 2018-10-22 NOTE — Assessment & Plan Note (Addendum)
Acute worsening found on recent hospitalization, appreciate cardiology, CHF clinic and EP care. Discussing upgrading pacemaker/defibrillator and if EF remains low considering VAD.  Reviewed discharge medication changes from the hospital, clarified diuretic dosing.

## 2018-10-22 NOTE — Assessment & Plan Note (Signed)
Acute worsening - now severe during recent hospitalization.

## 2018-10-22 NOTE — Assessment & Plan Note (Signed)
Acute worsening of NICM during recent hospitalization.

## 2018-10-22 NOTE — Patient Instructions (Addendum)
Medication Instructions:  1. TAKE Furosemide 20 to 40 mg daily as needed for weight gain or shortness of breath. Take with potassium.   If you need a refill on your cardiac medications before your next appointment, please call your pharmacy.    Lab work: BMP and BNP today or tomorrow. Checking on which location to send orders for patient.    If you have labs (blood work) drawn today and your tests are completely normal, you will receive your results only by: Marland Kitchen MyChart Message (if you have MyChart) OR . A paper copy in the mail If you have any lab test that is abnormal or we need to change your treatment, we will call you to review the results.   Testing/Procedures: No new testing needed   Follow-Up: At [facility], you and your health needs are our priority.  As part of our continuing mission to provide you with exceptional heart care, we have created designated Provider Care Teams.  These Care Teams include your primary Cardiologist (physician) and Advanced Practice Providers (APPs -  Physician Assistants and Nurse Practitioners) who all work together to provide you with the care you need, when you need it.  . You will need a follow up appointment in 1 month, televisit   . Providers on your designated Care Team:   . Murray Hodgkins, NP . Christell Faith, PA-C . Marrianne Mood, PA-C  Any Other Special Instructions Will Be Listed Below (If Applicable).  For educational health videos Log in to : [email or web address] Or : [email or web address], password : [email or web address]

## 2018-10-22 NOTE — Telephone Encounter (Signed)
Left voicemail message for patient to call back in regards to lab work that was requested by provider.

## 2018-10-22 NOTE — Telephone Encounter (Signed)
Pt called in this morning concerned about her appt time with Dr. Lovena Le.  Long discussion with Pt about what was going on with her heart and what the upgrade may or may not do for her.  Provided reassurance that she would be safe between now and when the procedure can be done.  Scheduled Pt for June 22 at 1:30 pm to hold spot for her.  Pt will discuss procedure with Dr. Lovena Le on June 18.  Pt thanked nurse for call.  Pt indicates she feels much better after call.  Advised Pt she could mychart this nurse anytime she felt scared or had questions.  Pt has virtual visit with Dr. [name] today.

## 2018-10-22 NOTE — Progress Notes (Signed)
If SBP > 90 ok to use losartan 12.5 at bedtime.
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
## 2018-10-22 NOTE — Telephone Encounter (Signed)
Left voicemail message to call back regarding labs that are needed.

## 2018-10-23 ENCOUNTER — Telehealth: Payer: Self-pay | Admitting: Cardiovascular Disease

## 2018-10-23 ENCOUNTER — Other Ambulatory Visit: Payer: Self-pay | Admitting: Cardiovascular Disease

## 2018-10-23 DIAGNOSIS — I420 Dilated cardiomyopathy: Secondary | ICD-10-CM | POA: Diagnosis not present

## 2018-10-23 DIAGNOSIS — I5022 Chronic systolic (congestive) heart failure: Secondary | ICD-10-CM | POA: Diagnosis not present

## 2018-10-23 MED ORDER — LOSARTAN POTASSIUM 25 MG PO TABS: ORAL_TABLET | ORAL | Status: DC

## 2018-10-23 NOTE — Telephone Encounter (Signed)
I called and spoke with a rep at [facility]. She states that the patient's BMP/ BNP have not resulted yet, but are pending currently.   Will forward to Dr. Donivan Scull nurse to look out for these and touch base with the patient tomorrow as I will be out of the office the next 2 days.

## 2018-10-23 NOTE — Telephone Encounter (Signed)
° °  1) How much weight have you gained and in what time span? 4 lbs today   2) If swelling, where is the swelling located? Denies swelling   3) Are you currently taking a fluid pill? Held per gollan because of kidney function wants to know when to restart     4) Are you currently SOB? Denies   5) Do you have a log of your daily weights (if so, list)?    Saturday  170   Sunday 169   Mon  170   Tues  170   Wed   170 am  - 174 now   6) Have you gained 3 pounds in a day or 5 pounds in a week? yes  7) Have you traveled recently? no

## 2018-10-23 NOTE — Progress Notes (Signed)
Can we track down her lab work if available thank you  Also can we let her know that it is safe to restart losartan 12.5 mg in the evening Previously she was taking this 4 times a week If she would like to start it 4 times a week and slow titration upward to daily as tolerated that would be okay

## 2018-10-23 NOTE — Telephone Encounter (Signed)
I spoke with the patient. She states that her weight is up 4 lbs from this morning.  She did eat some low Na+ chicken salad for lunch.  She denies any increased edema/ SOB, but states " I feel like I need it" in regards to her lasix.   She had an e-visit with Dr. [name] on 10/22/18. Lasix was changed from 40 mg BID to 20-40 mg once daily as needed for weight gain/ SOB. Take with potassium.  The patient has had no lasix/ potassium today as she thought Dr. [name] told her to take a break from this due to hypokalemia.  She did go to [facility] on [address] today for a repeat BMP/ BNP  11:00 am.   Results are currently pending.  I advised the patient that it is not uncommon to see your weigh go up during the day after eating/ drinking. I asked if she normally checks a PM weight. The patient states "I don't know why I checked it this afternoon."  I advised her we she get the results of her labs from today prior to dosing her lasix/ potassium. The patient is advised I will call [facility] to see if these have resulted yet. I have asked that she not take any lasix/ potassium until we call her with results. She has also been advised if we do not get back with her prior to in the AM, to continue to hold off on lasix/ potassium until she hears from [place].  I have asked that she weigh herself in the AM when she first gets up, but after voiding.   The patient voices understanding of the above and is agreeable.   She has also been advised to resume losartan 12.5 mg once daily at bedtime if her SBP is > 90 per Dr. [name] Dr. [name].  She documentation added to her e-visit note from 10/22/18.

## 2018-10-23 NOTE — Telephone Encounter (Signed)
Spoke with patient and reviewed that I was unable to reach [place] yesterday and wanted permission to schedule her appointment online. She provided consent for me to schedule her appointment on the [facility] website. Scheduled her for appointment online site for her to go at 11:15 AM to have her labs done over at the [place] draw center. She confirmed this information and advised that I would fax lab request forms over to them and provided her with address for her GPS. Lab request slips printed and faxed to number listed on site. Advised patient that we would give her a call with results. She was appreciative for the information, assistance, and help getting this coordinated.

## 2018-10-23 NOTE — Progress Notes (Signed)
I spoke with the patient. She had labs done at [facility] on [address] at 11:00 am today.  I have advised her of Dr. [name] Dr. Bensimhon's recommendations to restart losartan at 12.5 mg by mouth once daily at bedtime is her SBP is >90.  She is aware she may start out at losartan 12.5 mg 4 times a week and work her way up to every night at bedtime.   The patient voices understanding and is agreeable.

## 2018-10-24 LAB — BASIC METABOLIC PANEL
BUN/Creatinine Ratio: 24 (ref 12–28)
BUN: 33 mg/dL — ABNORMAL HIGH (ref 8–27)
CO2: 22 mmol/L (ref 20–29)
Calcium: 11 mg/dL — ABNORMAL HIGH (ref 8.7–10.3)
Chloride: 96 mmol/L (ref 96–106)
Creatinine, Ser: 1.36 mg/dL — ABNORMAL HIGH (ref 0.57–1.00)
GFR calc Af Amer: 45 mL/min/{1.73_m2} — ABNORMAL LOW (ref >59)
GFR calc non Af Amer: 39 mL/min/{1.73_m2} — ABNORMAL LOW (ref >59)
Glucose: 139 mg/dL — ABNORMAL HIGH (ref 65–99)
Potassium: 4.8 mmol/L (ref 3.5–5.2)
Sodium: 136 mmol/L (ref 134–144)

## 2018-10-24 LAB — BRAIN NATRIURETIC PEPTIDE: BNP: 141.2 pg/mL — ABNORMAL HIGH (ref 0.0–100.0)

## 2018-10-25 ENCOUNTER — Telehealth: Payer: Self-pay | Admitting: Cardiovascular Disease

## 2018-10-25 NOTE — Telephone Encounter (Signed)
Please call with lab results 

## 2018-10-25 NOTE — Telephone Encounter (Signed)
Spoke with patient and advised that labs are pending and that I would give her a call with those results once they are available. She verbalized understanding with no further questions at this time.

## 2018-10-25 NOTE — Telephone Encounter (Signed)
Labs are in providers folder for review.

## 2018-10-28 ENCOUNTER — Telehealth: Payer: Self-pay | Admitting: Cardiovascular Disease

## 2018-10-28 NOTE — Telephone Encounter (Signed)
Patient calling to speak with nurse States that last night she blacked out but did not pass out  Has constipation, stomach pains, and diarrhea Also feeling nauseous Please call to discuss

## 2018-10-28 NOTE — Telephone Encounter (Signed)
I spoke with the patient. She states she has had issues with her bowels for quite some time and had bowel surgery. She has had to "flip" her bowels between constipation/ diarrhea under the discretion of Dr. [name] (GI).   She states she took lasix on Thursday, Saturday, Sunday due to her weight going up 4 lbs in 5 hours. She did not have much increase in urine output.  She was also having bowel pain/ bloating/ constipation. She took miralax last night and had a large amount of diarrhea ~ 4 am today. She felt pre-syncopal during her diarrhea episode.  I advised her that base on her most recent lab work, her kidneys show she may be a little bit dehydrated. She is aware that taking lasix then miralax on top of that, then having a large amount of diarrhea may have caused a drop in her BP.  I have advised her to make sure she is gently hydrating herself with water. She complains of nausea as well.  I have asked that she call Dr. Samuel Germany office to see if they have any further advice regarding her GI issues.  She is scheduled for Bi-V ICD implant on 11/04/18.  She is aware to carefully monitor her weight, but not to take more lasix during the day based on a PM weight increase- this should be based on a 24 hour weight increase/ increased SOB/ increased swelling.  The patient voices understanding of the above and is agreeable.

## 2018-10-29 ENCOUNTER — Telehealth: Payer: Self-pay

## 2018-10-29 NOTE — Telephone Encounter (Signed)
YOUR CARDIOLOGY TEAM HAS ARRANGED FOR AN E-VISIT FOR YOUR APPOINTMENT - PLEASE REVIEW IMPORTANT INFORMATION BELOW SEVERAL DAYS PRIOR TO YOUR APPOINTMENT  Due to the recent COVID-19 pandemic, we are transitioning in-person office visits to tele-medicine visits in an effort to decrease unnecessary exposure to our patients, their families, and staff. These visits are billed to your insurance just like a normal visit is. We also encourage you to sign up for MyChart if you have not already done so. You will need a smartphone if possible. For patients that do not have this, we can still complete the visit using a regular telephone but do prefer a smartphone to enable video when possible. You may have a family member that lives with you that can help. If possible, we also ask that you have a blood pressure cuff and scale at home to measure your blood pressure, heart rate and weight prior to your scheduled appointment. Patients with clinical needs that need an in-person evaluation and testing will still be able to come to the office if absolutely necessary. If you have any questions, feel free to call our office.     YOUR PROVIDER WILL BE USING THE FOLLOWING PLATFORM TO COMPLETE YOUR VISIT: Doximity  . IF USING MYCHART - How to Download the MyChart App to Your SmartPhone   - If Apple, go to App Store and type in MyChart in the search bar and download the app. If Android, ask patient to go to Google Play Store and type in MyChart in the search bar and download the app. The app is free but as with any other app downloads, your phone may require you to verify saved payment information or Apple/Android password.  - You will need to then log into the app with your MyChart username and password, and select [facility] as your healthcare provider to link the account.  - When it is time for your visit, go to the MyChart app, find appointments, and click Begin Video Visit. Be sure to Select Allow for your device to  access the Microphone and Camera for your visit. You will then be connected, and your provider will be with you shortly.  **If you have any issues connecting or need assistance, please contact MyChart service desk [phone] ([phone])**  **If using a computer, in order to ensure the best quality for your visit, you will need to use either of the following Internet Browsers: Google Chrome or Microsoft Edge**  . IF USING DOXIMITY or DOXY.ME - The staff will give you instructions on receiving your link to join the meeting the day of your visit.      2-3 DAYS BEFORE YOUR APPOINTMENT  You will receive a telephone call from one of our [facility] team members - your caller ID may say "Unknown caller." If this is a video visit, we will walk you through how to get the video launched on your phone. We will remind you check your blood pressure, heart rate and weight prior to your scheduled appointment. If you have an Apple Watch or Kardia, please upload any pertinent ECG strips the day before or morning of your appointment to MyChart. Our staff will also make sure you have reviewed the consent and agree to move forward with your scheduled tele-health visit.     THE DAY OF YOUR APPOINTMENT  Approximately 15 minutes prior to your scheduled appointment, you will receive a telephone call from one of Heart[facility] team - your caller ID may say "Unknown caller."    Our staff will confirm medications, vital signs for the day and any symptoms you may be experiencing. Please have this information available prior to the time of visit start. It may also be helpful for you to have a pad of paper and pen handy for any instructions given during your visit. They will also walk you through joining the smartphone meeting if this is a video visit.    CONSENT FOR TELE-HEALTH VISIT - PLEASE REVIEW  I hereby voluntarily request, consent and authorize [facility] and its employed or contracted physicians, physician  assistants, nurse practitioners or other licensed health care professionals (the Practitioner), to provide me with telemedicine health care services (the "Services") as deemed necessary by the treating Practitioner. I acknowledge and consent to receive the Services by the Practitioner via telemedicine. I understand that the telemedicine visit will involve communicating with the Practitioner through live audiovisual communication technology and the disclosure of certain medical information by electronic transmission. I acknowledge that I have been given the opportunity to request an in-person assessment or other available alternative prior to the telemedicine visit and am voluntarily participating in the telemedicine visit.  I understand that I have the right to withhold or withdraw my consent to the use of telemedicine in the course of my care at any time, without affecting my right to future care or treatment, and that the Practitioner or I may terminate the telemedicine visit at any time. I understand that I have the right to inspect all information obtained and/or recorded in the course of the telemedicine visit and may receive copies of available information for a reasonable fee.  I understand that some of the potential risks of receiving the Services via telemedicine include:  . Delay or interruption in medical evaluation due to technological equipment failure or disruption; . Information transmitted may not be sufficient (e.g. poor resolution of images) to allow for appropriate medical decision making by the Practitioner; and/or  . In rare instances, security protocols could fail, causing a breach of personal health information.  Furthermore, I acknowledge that it is my responsibility to provide information about my medical history, conditions and care that is complete and accurate to the best of my ability. I acknowledge that Practitioner's advice, recommendations, and/or decision may be based on  factors not within their control, such as incomplete or inaccurate data provided by me or distortions of diagnostic images or specimens that may result from electronic transmissions. I understand that the practice of medicine is not an exact science and that Practitioner makes no warranties or guarantees regarding treatment outcomes. I acknowledge that I will receive a copy of this consent concurrently upon execution via email to the email address I last provided but may also request a printed copy by calling the office of [facility].    I understand that my insurance will be billed for this visit.   I have read or had this consent read to me. . I understand the contents of this consent, which adequately explains the benefits and risks of the Services being provided via telemedicine.  . I have been provided ample opportunity to ask questions regarding this consent and the Services and have had my questions answered to my satisfaction. . I give my informed consent for the services to be provided through the use of telemedicine in my medical care  By participating in this telemedicine visit I agree to the above.  

## 2018-10-31 ENCOUNTER — Telehealth (INDEPENDENT_AMBULATORY_CARE_PROVIDER_SITE_OTHER): Payer: Medicare Other | Admitting: Internal Medicine

## 2018-10-31 ENCOUNTER — Other Ambulatory Visit: Payer: Self-pay

## 2018-10-31 ENCOUNTER — Telehealth: Payer: Self-pay | Admitting: Internal Medicine

## 2018-10-31 DIAGNOSIS — I447 Left bundle-branch block, unspecified: Secondary | ICD-10-CM

## 2018-10-31 DIAGNOSIS — Z95810 Presence of automatic (implantable) cardiac defibrillator: Secondary | ICD-10-CM

## 2018-10-31 DIAGNOSIS — I5022 Chronic systolic (congestive) heart failure: Secondary | ICD-10-CM

## 2018-10-31 NOTE — Telephone Encounter (Signed)
Returned call to Pt.  Pt scheduled for covid test.  Pt again discussed wanting to have the nasopharyngeal swab test for covid.  This test no longer being used.  Dr. Lovena Le to call Pt to discuss.

## 2018-10-31 NOTE — Progress Notes (Signed)
Electrophysiology TeleHealth Note   Due to national recommendations of social distancing due to COVID 19, an audio/video telehealth visit is felt to be most appropriate for this patient at this time.  See MyChart message from today for the patient's consent to telehealth for [facility].   Date:  10/31/2018   ID:  Ashley [name], DOB 11/06/1945, MRN [id number]  Location: patient's home  Provider location: [address] [place]  Evaluation Performed: Follow-up visit  PCP:  [name], MD  Cardiologist:  [name], MD  Electrophysiologist:  Dr Lovena Le  Chief Complaint:  "I'm seeing you on Monday."  History of Present Illness:    Ashley [name] is a 73 y.o. female who presents via audio/video conferencing for a telehealth visit today. The patient was hospitalized a couple of weeks ago with worsening systolic heart failure and underwent left heart cath. 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 Her EF was 20%. She has had an ICD for 7 years and is not yet at [facility]. When her device was initially inserted, she had a narrow QRS. She has developed LBBB. She has not had a shock.The patient is otherwise without complaint today.  The patient denies symptoms of fevers, chills, cough, or new SOB worrisome for COVID 19.  Past Medical History:  Diagnosis Date  . Abdominal aortic atherosclerosis ([place]) 11/2015   by CT  . Allergy   . Anemia   . Anxiety   . Automatic implantable cardioverter-defibrillator in situ 2012   a. MDT single lead ICD.  Marland Kitchen Bronchiectasis    a. possibly mild, treat URIs aggressively  . Cancer ([facility])    basal cell, arms & neck  . Chronic sinusitis   . Depression   . Essential hypertension   . Fibrocystic breast disease   . Fibromyalgia   . GERD (gastroesophageal reflux disease)   . H/O multiple allergies 10/10/2013  . Hemorrhoids   . History of diverticulitis of colon   .  History of pneumonia 2012  . Insomnia   . Irritable bowel syndrome   . Laryngeal nodule    Dr. Thomasena Edis  . Migraine without aura, without mention of intractable migraine without mention of status migrainosus   . Mitral regurgitation   . Nonischemic cardiomyopathy ([place])    a. Recovered LV fxn - prev as low as 25%, now 50-55% by echo 04/2014;  b. 2012 s/p MDT [id number] [place] VR single lead ICD.  Marland Kitchen Osteoarthritis    knees, back, hands, low back,   . Osteoporosis 01/2013   T-2.7 spine (01/2016)  . PONV (postoperative nausea and vomiting)   . sigmoid diverticulitis with perforation, abscess and fistula 09/30/2013  . Sleep apnea 2010   borderline, inconclusive- /w [place]     Past Surgical History:  Procedure Laterality Date  . ABDOMINAL HYSTERECTOMY  1987   w/BSO  . ANAL RECTAL MANOMETRY N/A 02/02/2016   Procedure: ANO RECTAL MANOMETRY;  Surgeon: Mauri Pole, MD;  Location: [facility];  Service: Endoscopy;  Laterality: N/A;  . APPENDECTOMY    . AUGMENTATION MAMMAPLASTY    . breast cystectomies     due to FCBD  . BREAST MASS EXCISION  01/2010   Left-fibrocystic change w/intraductal papilloma, no malignancy  . carotid US  32/1224   8-25% LICA, normal RICA  . [employer] Bunionectomy  11/04/08   Right Great Toe (Dr. Beola Cord)  . COLONOSCOPY  01/2018   1 small benign polyp, diverticulosis, healthy anastomosis [name] @ [facility])  . COLOSTOMY N/A 10/02/2013   DESCENDING COLOSTOMY;  Surgeon: Imogene Burn. Tsuei, [place] N/A 02/10/2014   [name], MD  . EMG/MCV  10/16/01   + mild carpal tunnel  . ESOPHAGOGASTRODUODENOSCOPY  09/2014   small HH, irregular Z line, fundic gland polyps with mild gastritis ([place])  . Flex laryngoscopy  06/11/06   ([place]) nml  . IMPLANTABLE CARDIOVERTER DEFIBRILLATOR IMPLANT N/A 07/14/2011   MDT single chamber ICD  . KNEE ARTHROSCOPY W/ ORIF     Left; "meniscus tear"  . LASIK     left eye  . MANDIBLE SURGERY     "got about 6 pins in the  bottom of my jaw"  . NCS  11/2014   L ulnar neuropathy, mild B median nerve entrapment ([place])  . OSTEOTOMY     Left foot  . PARTIAL COLECTOMY N/A 10/02/2013   PARTIAL COLECTOMY;  Surgeon: Imogene Burn. Tsuei, MD  . RIGHT/LEFT HEART CATH AND CORONARY ANGIOGRAPHY N/A 10/16/2018   Procedure: RIGHT/LEFT HEART CATH AND CORONARY ANGIOGRAPHY;  Surgeon: Wellington Hampshire, MD;  Location: [facility];  Service: Cardiovascular;  Laterality: N/A;  . sinus surgery     x3 with balloon   . TOE AMPUTATION     "toe beside baby toe on left foot; got gangrene from corn"  . TONSILLECTOMY AND ADENOIDECTOMY      Current Outpatient Medications  Medication Sig Dispense Refill  . acetaminophen (TYLENOL) 500 MG tablet Take 500 mg by mouth daily as needed for moderate pain or headache (PAIN).     Marland Kitchen albuterol (PROVENTIL HFA;VENTOLIN HFA) 108 (90 Base) MCG/ACT inhaler TAKE 2 PUFFS BY MOUTH EVERY 6 HOURS AS NEEDED FOR WHEEZE OR SHORTNESS OF BREATH (Patient taking differently: Inhale 2 puffs into the lungs every 6 (six) hours as needed for wheezing or shortness of breath. ) 18 Inhaler 1  . AMBULATORY NON FORMULARY MEDICATION Medication Name:  1 squatty potty to use as needed 1 each 0  . Ascorbic Acid (VITAMIN C) 1000 MG tablet Take 1,000 mg by mouth See admin instructions. Take 1000 mg daily, may take an additional 1000 mg as needed for cold symptoms    . aspirin EC 81 MG tablet Take 81 mg by mouth at bedtime.     . benzonatate (TESSALON) 200 MG capsule Take 1 capsule (200 mg total) by mouth 3 (three) times daily as needed for cough. 20 capsule 0  . BIOTIN PO Take 1 tablet by mouth daily.    . carvedilol (COREG) 3.125 MG tablet Take 1 tablet (3.125 mg total) by mouth 2 (two) times daily with a meal for 30 days. 60 tablet 0  . cholecalciferol (VITAMIN D) 1000 units tablet Take 1,000 Units by mouth daily.     . Coenzyme Q10 (COQ-10) 100 MG CAPS Take 100 mg by mouth daily.    Marland Kitchen dextromethorphan (DELSYM) 30 MG/5ML liquid  Take 15 mg by mouth daily as needed for cough.     . Diphenhyd-Hydrocort-Nystatin (FIRST-DUKES MOUTHWASH) SUSP Swish and swallow 5 mLs every 6 (six) hours as needed (mouth blisters).     . ezetimibe (ZETIA) 10 MG tablet TAKE 1 TABLET (10 MG TOTAL) BY MOUTH DAILY. (Patient taking differently: Take 10 mg by mouth at bedtime. ) 90 tablet 2  . furosemide (LASIX) 20 MG tablet Take 1-2 tablets (20-40 mg total) by mouth as needed (As needed for weight  gain or shortness of breath). Take with potassium (Patient taking differently: Take 20 mg by mouth 2 (two) times daily as needed (As needed for weight gain or shortness of breath). Take with potassium) 90 tablet 3  . GARLIC PO Take 1 capsule by mouth daily.    Marland Kitchen guaiFENesin (MUCINEX) 600 MG 12 hr tablet Take 600 mg by mouth daily.    Marland Kitchen levalbuterol (XOPENEX HFA) 45 MCG/ACT inhaler Inhale 2 puffs into the lungs every 4 (four) hours as needed for wheezing or shortness of breath. 1 Inhaler 3  . losartan (COZAAR) 25 MG tablet Take 0.5 tablet (12.5 mg) by mouth once daily at bedtime if the top number on your blood pressure is >90.    . miconazole (MICOTIN) 2 % powder Apply 1 application topically as needed (yeast).    . nystatin cream (MYCOSTATIN) Apply 1 application topically 2 (two) times daily. (Patient taking differently: Apply 1 application topically as needed (yeast). ) 60 g 1  . Omega-3 Fatty Acids (FISH OIL PO) Take 1 capsule by mouth daily.    . potassium chloride (K-DUR) 10 MEQ tablet Take 10 mEq by mouth daily as needed (with laxis).     Marland Kitchen pyridOXINE (VITAMIN B-6) 100 MG tablet Take 100 mg by mouth daily.    . Red Yeast Rice Extract (RED YEAST RICE PO) Take 1 capsule by mouth daily.    Marland Kitchen spironolactone (ALDACTONE) 25 MG tablet Take 0.5 tablets (12.5 mg total) by mouth daily for 30 days. 15 tablet 0  . vitamin E 400 UNIT capsule Take 400 Units by mouth daily.     No current facility-administered medications for this visit.     Allergies:    Antihistamines, chlorpheniramine-type; Codeine; Cyclobenzaprine; Cymbalta [duloxetine hcl]; Furosemide; Influenza vac split [flu virus vaccine]; Prevnar [pneumococcal 13-val conj vacc]; Saline; Sulfasalazine; Atorvastatin; Chlorhexidine; Dilaudid [hydromorphone hcl]; Latex; Morphine and related; Pravastatin; Prevacid [lansoprazole]; Red dye; Rosuvastatin; Simvastatin; Diflucan [fluconazole]; Onion; Other; Penicillins; Sertraline; Tape; and Metaxalone   Social History:  The patient  reports that she has never smoked. She has never used smokeless tobacco. She reports that she does not drink alcohol or use drugs.   Family History:  The patient's  family history includes Alcohol abuse in her paternal grandfather; Arthritis in her mother; Colon polyps in her father; Dementia in her mother; Emphysema in her mother; Heart disease in her paternal grandfather and paternal grandmother; Hypertension in her maternal grandfather; Lung cancer in her father; Osteoporosis in her mother; Stroke in her mother and paternal grandfather.   ROS:  Please see the history of present illness.   All other systems are personally reviewed and negative.    Exam:    Vital Signs:  There were no vitals taken for this visit.  Well appearing, alert and conversant, regular work of breathing,  good skin color Eyes- anicteric, neuro- grossly intact, skin- no apparent rash or lesions or cyanosis, mouth- oral mucosa is pink   Labs/Other Tests and Data Reviewed:    Recent Labs: 05/31/2018: TSH 1.18 09/20/2018: ALT 21 10/19/2018: Hemoglobin 13.9; Platelets 199 10/23/2018: BNP 141.2; BUN 33; Creatinine, Ser 1.36; Potassium 4.8; Sodium 136   Wt Readings from Last 3 Encounters:  10/21/18 172 lb (78 kg)  10/19/18 176 lb 12.9 oz (80.2 kg)  10/09/18 177 lb 7 oz (80.5 kg)     Other studies personally reviewed: Additional studies/ records that were reviewed today include:   Last device remote is reviewed from [facility] PDF dated 08/21/18  which  reveals normal device function, no arrhythmias    ASSESSMENT & PLAN:    1.  Chronic systolic heart failure - her symptoms have worsened as she has developed LBBB. I have discussed the indications/risks/benefits/goals/expectations of BiV ICD upgrade from her single chamber ICD and she wishes to proceed. This will be scheduled next week. 2. ICD - her device was interogated last 2 months ago and working normally. 3. COVID 19 screen The patient denies symptoms of COVID 19 at this time.  The importance of social distancing was discussed today.  Follow-up:  3 months Next remote: 3 months  Current medicines are reviewed at length with the patient today.   The patient does not have concerns regarding her medicines.  The following changes were made today:  none  Labs/ tests ordered today include: none No orders of the defined types were placed in this encounter.    Patient Risk:  after full review of this patients clinical status, I feel that they are at moderate risk at this time.  Today, I have spent 25 minutes with the patient with telehealth technology discussing all of the above .    Signed, Cristopher Peru, MD  10/31/2018 10:59 AM     [facility] [address] [place] [address] [phone] (office) [phone] (fax)

## 2018-10-31 NOTE — Telephone Encounter (Signed)
  Patient is supposed to be going for covid testing before her procedure appt to put in a new pacemaker on Monday. She does not know where she is supposed to go to do the prescreening. Please call

## 2018-10-31 NOTE — H&P (View-Only) (Signed)
Electrophysiology TeleHealth Note   Due to national recommendations of social distancing due to COVID 19, an audio/video telehealth visit is felt to be most appropriate for this patient at this time.  See MyChart message from today for the patient's consent to telehealth for [facility].   Date:  10/31/2018   ID:  ROMONA MURDY, DOB [birth year], MRN [id number]  Location: patient's home  Provider location: [address] [place]  Evaluation Performed: Follow-up visit  PCP:  [name], MD  Cardiologist:  [name], MD  Electrophysiologist:  Dr Lovena Le  Chief Complaint:  "I'm seeing you on Monday."  History of Present Illness:    RAKSHA WOLFGANG is a 73 y.o. female who presents via audio/video conferencing for a telehealth visit today. The patient was hospitalized a couple of weeks ago with worsening systolic heart failure and underwent left heart cath. 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 Her EF was 20%. She has had an ICD for 7 years and is not yet at [facility]. When her device was initially inserted, she had a narrow QRS. She has developed LBBB. She has not had a shock.The patient is otherwise without complaint today.  The patient denies symptoms of fevers, chills, cough, or new SOB worrisome for COVID 19.  Past Medical History:  Diagnosis Date  . Abdominal aortic atherosclerosis ([place]) 11/2015   by CT  . Allergy   . Anemia   . Anxiety   . Automatic implantable cardioverter-defibrillator in situ 2012   a. MDT single lead ICD.  Marland Kitchen Bronchiectasis    a. possibly mild, treat URIs aggressively  . Cancer ([facility])    basal cell, arms & neck  . Chronic sinusitis   . Depression   . Essential hypertension   . Fibrocystic breast disease   . Fibromyalgia   . GERD (gastroesophageal reflux disease)   . H/O multiple allergies 10/10/2013  . Hemorrhoids   . History of diverticulitis of colon   .  History of pneumonia 2012  . Insomnia   . Irritable bowel syndrome   . Laryngeal nodule    Dr. Thomasena Edis  . Migraine without aura, without mention of intractable migraine without mention of status migrainosus   . Mitral regurgitation   . Nonischemic cardiomyopathy ([place])    a. Recovered LV fxn - prev as low as 25%, now 50-55% by echo 04/2014;  b. 2012 s/p MDT [id number] [place] VR single lead ICD.  Marland Kitchen Osteoarthritis    knees, back, hands, low back,   . Osteoporosis 01/2013   T-2.7 spine (01/2016)  . PONV (postoperative nausea and vomiting)   . sigmoid diverticulitis with perforation, abscess and fistula 09/30/2013  . Sleep apnea 2010   borderline, inconclusive- /w [place]     Past Surgical History:  Procedure Laterality Date  . ABDOMINAL HYSTERECTOMY  1987   w/BSO  . ANAL RECTAL MANOMETRY N/A 02/02/2016   Procedure: ANO RECTAL MANOMETRY;  Surgeon: Mauri Pole, MD;  Location: [facility];  Service: Endoscopy;  Laterality: N/A;  . APPENDECTOMY    . AUGMENTATION MAMMAPLASTY    . breast cystectomies     due to FCBD  . BREAST MASS EXCISION  01/2010   Left-fibrocystic change w/intraductal papilloma, no malignancy  . carotid US  74/1638   4-53% LICA, normal RICA  . [employer] Bunionectomy  11/04/08   Right Great Toe (Dr. Beola Cord)  . COLONOSCOPY  01/2018   1 small benign polyp, diverticulosis, healthy anastomosis [name] @ [facility])  . COLOSTOMY N/A 10/02/2013   DESCENDING COLOSTOMY;  Surgeon: Imogene Burn. Tsuei, [place] N/A 02/10/2014   [name], MD  . EMG/MCV  10/16/01   + mild carpal tunnel  . ESOPHAGOGASTRODUODENOSCOPY  09/2014   small HH, irregular Z line, fundic gland polyps with mild gastritis ([place])  . Flex laryngoscopy  06/11/06   ([place]) nml  . IMPLANTABLE CARDIOVERTER DEFIBRILLATOR IMPLANT N/A 07/14/2011   MDT single chamber ICD  . KNEE ARTHROSCOPY W/ ORIF     Left; "meniscus tear"  . LASIK     left eye  . MANDIBLE SURGERY     "got about 6 pins in the  bottom of my jaw"  . NCS  11/2014   L ulnar neuropathy, mild B median nerve entrapment ([place])  . OSTEOTOMY     Left foot  . PARTIAL COLECTOMY N/A 10/02/2013   PARTIAL COLECTOMY;  Surgeon: Imogene Burn. Tsuei, MD  . RIGHT/LEFT HEART CATH AND CORONARY ANGIOGRAPHY N/A 10/16/2018   Procedure: RIGHT/LEFT HEART CATH AND CORONARY ANGIOGRAPHY;  Surgeon: Wellington Hampshire, MD;  Location: [facility];  Service: Cardiovascular;  Laterality: N/A;  . sinus surgery     x3 with balloon   . TOE AMPUTATION     "toe beside baby toe on left foot; got gangrene from corn"  . TONSILLECTOMY AND ADENOIDECTOMY      Current Outpatient Medications  Medication Sig Dispense Refill  . acetaminophen (TYLENOL) 500 MG tablet Take 500 mg by mouth daily as needed for moderate pain or headache (PAIN).     Marland Kitchen albuterol (PROVENTIL HFA;VENTOLIN HFA) 108 (90 Base) MCG/ACT inhaler TAKE 2 PUFFS BY MOUTH EVERY 6 HOURS AS NEEDED FOR WHEEZE OR SHORTNESS OF BREATH (Patient taking differently: Inhale 2 puffs into the lungs every 6 (six) hours as needed for wheezing or shortness of breath. ) 18 Inhaler 1  . AMBULATORY NON FORMULARY MEDICATION Medication Name:  1 squatty potty to use as needed 1 each 0  . Ascorbic Acid (VITAMIN C) 1000 MG tablet Take 1,000 mg by mouth See admin instructions. Take 1000 mg daily, may take an additional 1000 mg as needed for cold symptoms    . aspirin EC 81 MG tablet Take 81 mg by mouth at bedtime.     . benzonatate (TESSALON) 200 MG capsule Take 1 capsule (200 mg total) by mouth 3 (three) times daily as needed for cough. 20 capsule 0  . BIOTIN PO Take 1 tablet by mouth daily.    . carvedilol (COREG) 3.125 MG tablet Take 1 tablet (3.125 mg total) by mouth 2 (two) times daily with a meal for 30 days. 60 tablet 0  . cholecalciferol (VITAMIN D) 1000 units tablet Take 1,000 Units by mouth daily.     . Coenzyme Q10 (COQ-10) 100 MG CAPS Take 100 mg by mouth daily.    Marland Kitchen dextromethorphan (DELSYM) 30 MG/5ML liquid  Take 15 mg by mouth daily as needed for cough.     . Diphenhyd-Hydrocort-Nystatin (FIRST-DUKES MOUTHWASH) SUSP Swish and swallow 5 mLs every 6 (six) hours as needed (mouth blisters).     . ezetimibe (ZETIA) 10 MG tablet TAKE 1 TABLET (10 MG TOTAL) BY MOUTH DAILY. (Patient taking differently: Take 10 mg by mouth at bedtime. ) 90 tablet 2  . furosemide (LASIX) 20 MG tablet Take 1-2 tablets (20-40 mg total) by mouth as needed (As needed for weight  gain or shortness of breath). Take with potassium (Patient taking differently: Take 20 mg by mouth 2 (two) times daily as needed (As needed for weight gain or shortness of breath). Take with potassium) 90 tablet 3  . GARLIC PO Take 1 capsule by mouth daily.    Marland Kitchen guaiFENesin (MUCINEX) 600 MG 12 hr tablet Take 600 mg by mouth daily.    Marland Kitchen levalbuterol (XOPENEX HFA) 45 MCG/ACT inhaler Inhale 2 puffs into the lungs every 4 (four) hours as needed for wheezing or shortness of breath. 1 Inhaler 3  . losartan (COZAAR) 25 MG tablet Take 0.5 tablet (12.5 mg) by mouth once daily at bedtime if the top number on your blood pressure is >90.    . miconazole (MICOTIN) 2 % powder Apply 1 application topically as needed (yeast).    . nystatin cream (MYCOSTATIN) Apply 1 application topically 2 (two) times daily. (Patient taking differently: Apply 1 application topically as needed (yeast). ) 60 g 1  . Omega-3 Fatty Acids (FISH OIL PO) Take 1 capsule by mouth daily.    . potassium chloride (K-DUR) 10 MEQ tablet Take 10 mEq by mouth daily as needed (with laxis).     Marland Kitchen pyridOXINE (VITAMIN B-6) 100 MG tablet Take 100 mg by mouth daily.    . Red Yeast Rice Extract (RED YEAST RICE PO) Take 1 capsule by mouth daily.    Marland Kitchen spironolactone (ALDACTONE) 25 MG tablet Take 0.5 tablets (12.5 mg total) by mouth daily for 30 days. 15 tablet 0  . vitamin E 400 UNIT capsule Take 400 Units by mouth daily.     No current facility-administered medications for this visit.     Allergies:    Antihistamines, chlorpheniramine-type; Codeine; Cyclobenzaprine; Cymbalta [duloxetine hcl]; Furosemide; Influenza vac split [flu virus vaccine]; Prevnar [pneumococcal 13-val conj vacc]; Saline; Sulfasalazine; Atorvastatin; Chlorhexidine; Dilaudid [hydromorphone hcl]; Latex; Morphine and related; Pravastatin; Prevacid [lansoprazole]; Red dye; Rosuvastatin; Simvastatin; Diflucan [fluconazole]; Onion; Other; Penicillins; Sertraline; Tape; and Metaxalone   Social History:  The patient  reports that she has never smoked. She has never used smokeless tobacco. She reports that she does not drink alcohol or use drugs.   Family History:  The patient's  family history includes Alcohol abuse in her paternal grandfather; Arthritis in her mother; Colon polyps in her father; Dementia in her mother; Emphysema in her mother; Heart disease in her paternal grandfather and paternal grandmother; Hypertension in her maternal grandfather; Lung cancer in her father; Osteoporosis in her mother; Stroke in her mother and paternal grandfather.   ROS:  Please see the history of present illness.   All other systems are personally reviewed and negative.    Exam:    Vital Signs:  There were no vitals taken for this visit.  Well appearing, alert and conversant, regular work of breathing,  good skin color Eyes- anicteric, neuro- grossly intact, skin- no apparent rash or lesions or cyanosis, mouth- oral mucosa is pink   Labs/Other Tests and Data Reviewed:    Recent Labs: 05/31/2018: TSH 1.18 09/20/2018: ALT 21 10/19/2018: Hemoglobin 13.9; Platelets 199 10/23/2018: BNP 141.2; BUN 33; Creatinine, Ser 1.36; Potassium 4.8; Sodium 136   Wt Readings from Last 3 Encounters:  10/21/18 172 lb (78 kg)  10/19/18 176 lb 12.9 oz (80.2 kg)  10/09/18 177 lb 7 oz (80.5 kg)     Other studies personally reviewed: Additional studies/ records that were reviewed today include:   Last device remote is reviewed from [place] PDF dated 08/21/18  which  reveals normal device function, no arrhythmias    ASSESSMENT & PLAN:    1.  Chronic systolic heart failure - her symptoms have worsened as she has developed LBBB. I have discussed the indications/risks/benefits/goals/expectations of BiV ICD upgrade from her single chamber ICD and she wishes to proceed. This will be scheduled next week. 2. ICD - her device was interogated last 2 months ago and working normally. 3. COVID 19 screen The patient denies symptoms of COVID 19 at this time.  The importance of social distancing was discussed today.  Follow-up:  3 months Next remote: 3 months  Current medicines are reviewed at length with the patient today.   The patient does not have concerns regarding her medicines.  The following changes were made today:  none  Labs/ tests ordered today include: none No orders of the defined types were placed in this encounter.    Patient Risk:  after full review of this patients clinical status, I feel that they are at moderate risk at this time.  Today, I have spent 25 minutes with the patient with telehealth technology discussing all of the above .    Signed, Cristopher Peru, MD  10/31/2018 10:59 AM     [facility] [address] [place] [address] [phone] (office) [phone] (fax)

## 2018-11-01 ENCOUNTER — Other Ambulatory Visit (HOSPITAL_COMMUNITY)
Admission: RE | Admit: 2018-11-01 | Discharge: 2018-11-01 | Disposition: A | Discharge status: Home / Self Care | Payer: Medicare Other | Source: Ambulatory Visit | Attending: Internal Medicine | Admitting: Internal Medicine

## 2018-11-01 ENCOUNTER — Telehealth: Payer: Self-pay | Admitting: Internal Medicine

## 2018-11-01 DIAGNOSIS — Z1159 Encounter for screening for other viral diseases: Secondary | ICD-10-CM | POA: Insufficient documentation

## 2018-11-01 LAB — SARS CORONAVIRUS 2 (TAT 6-24 HRS): SARS Coronavirus 2: NEGATIVE

## 2018-11-01 NOTE — Telephone Encounter (Signed)
Advised to go to [facility] entrance A Monday morning for her device implant. Pt thanks me for calling back.

## 2018-11-01 NOTE — Telephone Encounter (Signed)
New message   Patient wants to know where to check in  for her cath lab procedure on 11/04/18. Please advise.

## 2018-11-04 ENCOUNTER — Encounter (HOSPITAL_COMMUNITY)
Admission: RE | Disposition: A | Discharge status: Home / Self Care | Payer: Self-pay | Source: Home / Self Care | Attending: Internal Medicine

## 2018-11-04 ENCOUNTER — Other Ambulatory Visit: Payer: Self-pay

## 2018-11-04 ENCOUNTER — Ambulatory Visit (HOSPITAL_COMMUNITY)
Admission: RE | Admit: 2018-11-04 | Discharge: 2018-11-05 | Disposition: A | Discharge status: Home / Self Care | Payer: Medicare Other | Attending: Internal Medicine | Admitting: Internal Medicine

## 2018-11-04 DIAGNOSIS — I5022 Chronic systolic (congestive) heart failure: Secondary | ICD-10-CM

## 2018-11-04 DIAGNOSIS — K219 Gastro-esophageal reflux disease without esophagitis: Secondary | ICD-10-CM | POA: Insufficient documentation

## 2018-11-04 DIAGNOSIS — Z8262 Family history of osteoporosis: Secondary | ICD-10-CM | POA: Insufficient documentation

## 2018-11-04 DIAGNOSIS — K589 Irritable bowel syndrome without diarrhea: Secondary | ICD-10-CM | POA: Diagnosis not present

## 2018-11-04 DIAGNOSIS — Z7982 Long term (current) use of aspirin: Secondary | ICD-10-CM | POA: Insufficient documentation

## 2018-11-04 DIAGNOSIS — G4730 Sleep apnea, unspecified: Secondary | ICD-10-CM | POA: Diagnosis not present

## 2018-11-04 DIAGNOSIS — Z006 Encounter for examination for normal comparison and control in clinical research program: Secondary | ICD-10-CM | POA: Diagnosis not present

## 2018-11-04 DIAGNOSIS — I340 Nonrheumatic mitral (valve) insufficiency: Secondary | ICD-10-CM | POA: Insufficient documentation

## 2018-11-04 DIAGNOSIS — Z90710 Acquired absence of both cervix and uterus: Secondary | ICD-10-CM | POA: Diagnosis not present

## 2018-11-04 DIAGNOSIS — Z888 Allergy status to other drugs, medicaments and biological substances status: Secondary | ICD-10-CM | POA: Diagnosis not present

## 2018-11-04 DIAGNOSIS — M797 Fibromyalgia: Secondary | ICD-10-CM | POA: Insufficient documentation

## 2018-11-04 DIAGNOSIS — I447 Left bundle-branch block, unspecified: Secondary | ICD-10-CM | POA: Insufficient documentation

## 2018-11-04 DIAGNOSIS — Z9049 Acquired absence of other specified parts of digestive tract: Secondary | ICD-10-CM | POA: Insufficient documentation

## 2018-11-04 DIAGNOSIS — Z882 Allergy status to sulfonamides status: Secondary | ICD-10-CM | POA: Diagnosis not present

## 2018-11-04 DIAGNOSIS — I5042 Chronic combined systolic (congestive) and diastolic (congestive) heart failure: Secondary | ICD-10-CM | POA: Diagnosis present

## 2018-11-04 DIAGNOSIS — I110 Hypertensive heart disease with heart failure: Secondary | ICD-10-CM | POA: Diagnosis not present

## 2018-11-04 DIAGNOSIS — G5600 Carpal tunnel syndrome, unspecified upper limb: Secondary | ICD-10-CM | POA: Diagnosis not present

## 2018-11-04 DIAGNOSIS — Z823 Family history of stroke: Secondary | ICD-10-CM | POA: Insufficient documentation

## 2018-11-04 DIAGNOSIS — I428 Other cardiomyopathies: Secondary | ICD-10-CM | POA: Insufficient documentation

## 2018-11-04 DIAGNOSIS — Z8249 Family history of ischemic heart disease and other diseases of the circulatory system: Secondary | ICD-10-CM | POA: Insufficient documentation

## 2018-11-04 DIAGNOSIS — Z885 Allergy status to narcotic agent status: Secondary | ICD-10-CM | POA: Diagnosis not present

## 2018-11-04 DIAGNOSIS — Z887 Allergy status to serum and vaccine status: Secondary | ICD-10-CM | POA: Insufficient documentation

## 2018-11-04 DIAGNOSIS — Z883 Allergy status to other anti-infective agents status: Secondary | ICD-10-CM | POA: Diagnosis not present

## 2018-11-04 DIAGNOSIS — Z880 Allergy status to penicillin: Secondary | ICD-10-CM | POA: Diagnosis not present

## 2018-11-04 DIAGNOSIS — Z91040 Latex allergy status: Secondary | ICD-10-CM | POA: Diagnosis not present

## 2018-11-04 DIAGNOSIS — Z950 Presence of cardiac pacemaker: Secondary | ICD-10-CM | POA: Diagnosis present

## 2018-11-04 DIAGNOSIS — Z89422 Acquired absence of other left toe(s): Secondary | ICD-10-CM | POA: Insufficient documentation

## 2018-11-04 DIAGNOSIS — Z90722 Acquired absence of ovaries, bilateral: Secondary | ICD-10-CM | POA: Diagnosis not present

## 2018-11-04 DIAGNOSIS — G4700 Insomnia, unspecified: Secondary | ICD-10-CM | POA: Diagnosis not present

## 2018-11-04 DIAGNOSIS — Z79899 Other long term (current) drug therapy: Secondary | ICD-10-CM | POA: Diagnosis not present

## 2018-11-04 DIAGNOSIS — M1990 Unspecified osteoarthritis, unspecified site: Secondary | ICD-10-CM | POA: Diagnosis not present

## 2018-11-04 DIAGNOSIS — Z95810 Presence of automatic (implantable) cardiac defibrillator: Secondary | ICD-10-CM

## 2018-11-04 HISTORY — PX: BIV UPGRADE: EP1202

## 2018-11-04 HISTORY — DX: Dyspnea, unspecified: R06.00

## 2018-11-04 LAB — SURGICAL PCR SCREEN
MRSA, PCR: NEGATIVE
Staphylococcus aureus: NEGATIVE

## 2018-11-04 SURGERY — BIV UPGRADE

## 2018-11-04 MED ORDER — LOSARTAN POTASSIUM 25 MG PO TABS
25.0000 mg | ORAL_TABLET | Freq: Every day | ORAL | Status: DC
  Filled 2018-11-04: qty 1

## 2018-11-04 MED ORDER — FENTANYL CITRATE (PF) 100 MCG/2ML IJ SOLN
INTRAMUSCULAR | Status: AC
  Filled 2018-11-04: qty 2

## 2018-11-04 MED ORDER — ACETAMINOPHEN 325 MG PO TABS
325.0000 mg | ORAL_TABLET | ORAL | Status: DC | PRN
  Administered 2018-11-04 – 2018-11-05 (×2): 650 mg via ORAL
  Filled 2018-11-04 (×2): qty 2

## 2018-11-04 MED ORDER — DEXTROMETHORPHAN POLISTIREX ER 30 MG/5ML PO SUER
15.0000 mg | Freq: Every day | ORAL | Status: DC | PRN
  Filled 2018-11-04: qty 5

## 2018-11-04 MED ORDER — FENTANYL CITRATE (PF) 100 MCG/2ML IJ SOLN
INTRAMUSCULAR | Status: DC | PRN
  Administered 2018-11-04 (×7): 12.5 ug via INTRAVENOUS

## 2018-11-04 MED ORDER — ONDANSETRON HCL 4 MG/2ML IJ SOLN: 4.0000 mg | Freq: Four times a day (QID) | INTRAMUSCULAR | Status: DC | PRN

## 2018-11-04 MED ORDER — SODIUM CHLORIDE 0.9 % IV SOLN
INTRAVENOUS | Status: AC
  Filled 2018-11-04: qty 2

## 2018-11-04 MED ORDER — SODIUM CHLORIDE 0.9 % IV SOLN
80.0000 mg | INTRAVENOUS | Status: AC
  Administered 2018-11-04: 80 mg
  Filled 2018-11-04: qty 2

## 2018-11-04 MED ORDER — MIDAZOLAM HCL 5 MG/5ML IJ SOLN
INTRAMUSCULAR | Status: AC
  Filled 2018-11-04: qty 5

## 2018-11-04 MED ORDER — HEPARIN (PORCINE) IN NACL 1000-0.9 UT/500ML-% IV SOLN
INTRAVENOUS | Status: AC
  Filled 2018-11-04: qty 500

## 2018-11-04 MED ORDER — ALBUTEROL SULFATE (2.5 MG/3ML) 0.083% IN NEBU: 3.0000 mL | INHALATION_SOLUTION | RESPIRATORY_TRACT | Status: DC | PRN

## 2018-11-04 MED ORDER — MUPIROCIN 2 % EX OINT
1.0000 "application " | TOPICAL_OINTMENT | Freq: Once | CUTANEOUS | Status: AC
  Administered 2018-11-04: 1 via TOPICAL
  Filled 2018-11-04: qty 22

## 2018-11-04 MED ORDER — VANCOMYCIN HCL IN DEXTROSE 1-5 GM/200ML-% IV SOLN
INTRAVENOUS | Status: AC
  Filled 2018-11-04: qty 200

## 2018-11-04 MED ORDER — VITAMIN D 25 MCG (1000 UNIT) PO TABS
1000.0000 [IU] | ORAL_TABLET | Freq: Every day | ORAL | Status: DC
  Administered 2018-11-05: 1000 [IU] via ORAL
  Filled 2018-11-04: qty 1

## 2018-11-04 MED ORDER — HEPARIN (PORCINE) IN NACL 1000-0.9 UT/500ML-% IV SOLN
INTRAVENOUS | Status: DC | PRN
  Administered 2018-11-04: 500 mL

## 2018-11-04 MED ORDER — FIRST-DUKES MOUTHWASH MT SUSP: 5.0000 mL | Freq: Four times a day (QID) | OROMUCOSAL | Status: DC | PRN

## 2018-11-04 MED ORDER — BENZONATATE 100 MG PO CAPS: 200.0000 mg | ORAL_CAPSULE | Freq: Three times a day (TID) | ORAL | Status: DC | PRN

## 2018-11-04 MED ORDER — POTASSIUM CHLORIDE ER 10 MEQ PO TBCR: 10.0000 meq | EXTENDED_RELEASE_TABLET | Freq: Every day | ORAL | Status: DC | PRN

## 2018-11-04 MED ORDER — GUAIFENESIN ER 600 MG PO TB12
600.0000 mg | ORAL_TABLET | Freq: Every day | ORAL | Status: DC
  Administered 2018-11-05: 600 mg via ORAL
  Filled 2018-11-04: qty 1

## 2018-11-04 MED ORDER — SODIUM CHLORIDE 0.9 % IV SOLN
INTRAVENOUS | Status: DC
  Administered 2018-11-04: 13:00:00 via INTRAVENOUS

## 2018-11-04 MED ORDER — CARVEDILOL 3.125 MG PO TABS
3.1250 mg | ORAL_TABLET | Freq: Two times a day (BID) | ORAL | Status: DC
  Administered 2018-11-04 – 2018-11-05 (×2): 3.125 mg via ORAL
  Filled 2018-11-04 (×2): qty 1

## 2018-11-04 MED ORDER — SPIRONOLACTONE 12.5 MG HALF TABLET
12.5000 mg | ORAL_TABLET | Freq: Every day | ORAL | Status: DC
  Administered 2018-11-05: 12.5 mg via ORAL
  Filled 2018-11-04: qty 1

## 2018-11-04 MED ORDER — LIDOCAINE HCL (PF) 1 % IJ SOLN
INTRAMUSCULAR | Status: DC | PRN
  Administered 2018-11-04: 10 mL
  Administered 2018-11-04: 30 mL

## 2018-11-04 MED ORDER — LIDOCAINE HCL 1 % IJ SOLN
INTRAMUSCULAR | Status: AC
  Filled 2018-11-04: qty 60

## 2018-11-04 MED ORDER — MIDAZOLAM HCL 5 MG/5ML IJ SOLN
INTRAMUSCULAR | Status: DC | PRN
  Administered 2018-11-04 (×8): 1 mg via INTRAVENOUS

## 2018-11-04 MED ORDER — VITAMIN B-6 100 MG PO TABS
100.0000 mg | ORAL_TABLET | Freq: Every day | ORAL | Status: DC
  Administered 2018-11-05: 100 mg via ORAL
  Filled 2018-11-04: qty 1

## 2018-11-04 MED ORDER — VANCOMYCIN HCL IN DEXTROSE 1-5 GM/200ML-% IV SOLN: 1000.0000 mg | Freq: Two times a day (BID) | INTRAVENOUS | Status: DC

## 2018-11-04 MED ORDER — IOHEXOL 350 MG/ML SOLN
INTRAVENOUS | Status: DC | PRN
  Administered 2018-11-04: 50 mL via INTRAVENOUS

## 2018-11-04 MED ORDER — MUPIROCIN 2 % EX OINT
TOPICAL_OINTMENT | CUTANEOUS | Status: AC
  Administered 2018-11-04: 1 via TOPICAL
  Filled 2018-11-04: qty 22

## 2018-11-04 MED ORDER — VANCOMYCIN HCL IN DEXTROSE 1-5 GM/200ML-% IV SOLN
1000.0000 mg | INTRAVENOUS | Status: AC
  Administered 2018-11-04: 1000 mg via INTRAVENOUS
  Filled 2018-11-04: qty 200

## 2018-11-04 SURGICAL SUPPLY — 21 items
ADAPTER SEALING SSA-EW-09 (MISCELLANEOUS) ×1 IMPLANT
ADPR INTRO LNG 9FR SL XTD WNG (MISCELLANEOUS) ×1
BALLN ATTAIN 80 (BALLOONS) ×2
BALLOON ATTAIN 80 (BALLOONS) IMPLANT
CABLE SURGICAL S-101-97-12 (CABLE) ×3 IMPLANT
CATH ATTAIN COM SURV 6250V-MB2 (CATHETERS) ×1 IMPLANT
CATH ATTAIN SEL SURV 6248V-130 (CATHETERS) ×1 IMPLANT
CATH HEX JOSEPH 2-5-2 65CM 6F (CATHETERS) ×1 IMPLANT
CATH RIGHTSITE C315HIS02 (CATHETERS) ×1 IMPLANT
GUIDEWIRE ANGLED .035X150CM (WIRE) ×1 IMPLANT
ICD CLARIA MRI DTMA1D1 (ICD Generator) ×1 IMPLANT
LEAD CAPSURE NOVUS 5076-52CM (Lead) ×1 IMPLANT
LEAD SELECT SECURE 3830 383069 (Lead) IMPLANT
PAD PRO RADIOLUCENT 2001M-C (PAD) ×2 IMPLANT
SELECT SECURE 3830 383069 (Lead) ×2 IMPLANT
SHEATH CLASSIC 7F (SHEATH) ×1 IMPLANT
SHEATH CLASSIC 9.5F (SHEATH) ×1 IMPLANT
SLITTER 6232ADJ (MISCELLANEOUS) ×1 IMPLANT
TRAY PACEMAKER INSERTION (PACKS) ×2 IMPLANT
WIRE ASAHI SION 190X3X12 .014 (WIRE) ×1 IMPLANT
WIRE LUGE 182CM (WIRE) ×1 IMPLANT

## 2018-11-04 NOTE — Progress Notes (Signed)
Pharmacy note: vancomycin   73 yo female s/p BiV ICD on vancomycin for surgical prophylaxis. Vancomycin 1000mg  IV ordered for 6/23 and 2am -1000mg  IV vancomycin given at 2:10pm today -SCr= 1.26 (6/10; baseline ~ 1.1), CrCl ~ 40  Plan -1000mg  IV of vancomycin will cover for 24 hours -No further vancomycin is needed and will discontinue to 2am order on 6/23  Hildred Laser, PharmD Clinical Pharmacist **Pharmacist phone directory can now be found on [email or web address] ([email or web address]).  Listed under [place].

## 2018-11-04 NOTE — Interval H&P Note (Signed)
History and Physical Interval Note:  11/04/2018 1:51 PM  Ashley [name]  has presented today for surgery, with the diagnosis of cardiomyopathy.  The various methods of treatment have been discussed with the patient and family. After consideration of risks, benefits and other options for treatment, the patient has consented to  Procedure(s): BIV ICD  UPGRADE (N/A) as a surgical intervention.  The patient's history has been reviewed, patient examined, no change in status, stable for surgery.  I have reviewed the patient's chart and labs.  Questions were answered to the patient's satisfaction.     Cristopher Peru

## 2018-11-04 NOTE — Progress Notes (Signed)
Called pharmacy to see if pt could have mupirocin ointment in nares for pre treatment of pending pcr results, Per Santiago Glad pharmacist, pt can have mupirocin treatment.

## 2018-11-05 ENCOUNTER — Ambulatory Visit (HOSPITAL_COMMUNITY): Payer: Medicare Other

## 2018-11-05 ENCOUNTER — Encounter (HOSPITAL_COMMUNITY): Payer: Self-pay | Admitting: Internal Medicine

## 2018-11-05 ENCOUNTER — Other Ambulatory Visit: Payer: Self-pay

## 2018-11-05 DIAGNOSIS — K589 Irritable bowel syndrome without diarrhea: Secondary | ICD-10-CM | POA: Diagnosis not present

## 2018-11-05 DIAGNOSIS — G5600 Carpal tunnel syndrome, unspecified upper limb: Secondary | ICD-10-CM | POA: Diagnosis not present

## 2018-11-05 DIAGNOSIS — K219 Gastro-esophageal reflux disease without esophagitis: Secondary | ICD-10-CM | POA: Diagnosis not present

## 2018-11-05 DIAGNOSIS — M1990 Unspecified osteoarthritis, unspecified site: Secondary | ICD-10-CM | POA: Diagnosis not present

## 2018-11-05 DIAGNOSIS — I5022 Chronic systolic (congestive) heart failure: Secondary | ICD-10-CM | POA: Diagnosis not present

## 2018-11-05 DIAGNOSIS — I428 Other cardiomyopathies: Secondary | ICD-10-CM | POA: Diagnosis not present

## 2018-11-05 DIAGNOSIS — Z95810 Presence of automatic (implantable) cardiac defibrillator: Secondary | ICD-10-CM | POA: Diagnosis not present

## 2018-11-05 DIAGNOSIS — I447 Left bundle-branch block, unspecified: Secondary | ICD-10-CM | POA: Diagnosis not present

## 2018-11-05 DIAGNOSIS — G4730 Sleep apnea, unspecified: Secondary | ICD-10-CM | POA: Diagnosis not present

## 2018-11-05 DIAGNOSIS — Z006 Encounter for examination for normal comparison and control in clinical research program: Secondary | ICD-10-CM | POA: Diagnosis not present

## 2018-11-05 DIAGNOSIS — I340 Nonrheumatic mitral (valve) insufficiency: Secondary | ICD-10-CM | POA: Diagnosis not present

## 2018-11-05 DIAGNOSIS — G4700 Insomnia, unspecified: Secondary | ICD-10-CM | POA: Diagnosis not present

## 2018-11-05 DIAGNOSIS — I429 Cardiomyopathy, unspecified: Secondary | ICD-10-CM | POA: Diagnosis not present

## 2018-11-05 DIAGNOSIS — I110 Hypertensive heart disease with heart failure: Secondary | ICD-10-CM | POA: Diagnosis not present

## 2018-11-05 MED FILL — Lidocaine HCl Local Inj 1%: INTRAMUSCULAR | Qty: 60 | Status: AC

## 2018-11-05 NOTE — Plan of Care (Signed)
  Problem: Health Behavior/Discharge Planning: Goal: Ability to manage health-related needs will improve Outcome: Completed/Met   Problem: Clinical Measurements: Goal: Ability to maintain clinical measurements within normal limits will improve Outcome: Completed/Met Goal: Will remain free from infection Outcome: Completed/Met   Problem: Activity: Goal: Risk for activity intolerance will decrease Outcome: Completed/Met   Problem: Nutrition: Goal: Adequate nutrition will be maintained Outcome: Completed/Met   Problem: Pain Managment: Goal: General experience of comfort will improve Outcome: Completed/Met   Problem: Safety: Goal: Ability to remain free from injury will improve Outcome: Completed/Met   Problem: Skin Integrity: Goal: Risk for impaired skin integrity will decrease Outcome: Completed/Met   Problem: Cardiac: Goal: Ability to achieve and maintain adequate cardiopulmonary perfusion will improve Outcome: Completed/Met

## 2018-11-05 NOTE — Discharge Instructions (Signed)
° ° °  Supplemental Discharge Instructions for  °Pacemaker/Defibrillator Patients ° °Activity °No heavy lifting or vigorous activity with your left/right arm for 6 to 8 weeks.  Do not raise your left/right arm above your head for one week.  Gradually raise your affected arm as drawn below. ° °        ° °__ ° °NO DRIVING for     ; you may begin driving on     . ° °WOUND CARE °  Keep the wound area clean and dry.  Do not get this area wet for one week. No showers for one week; you may shower on     . °  The tape/steri-strips on your wound will fall off; do not pull them off.  No bandage is needed on the site.  DO  NOT apply any creams, oils, or ointments to the wound area. °  If you notice any drainage or discharge from the wound, any swelling or bruising at the site, or you develop a fever > 101? F after you are discharged home, call the office at once. ° °Special Instructions °  You are still able to use cellular telephones; use the ear opposite the side where you have your pacemaker/defibrillator.  Avoid carrying your cellular phone near your device. °  When traveling through airports, show security personnel your identification card to avoid being screened in the metal detectors.  Ask the security personnel to use the hand wand. °  Avoid arc welding equipment, MRI testing (magnetic resonance imaging), TENS units (transcutaneous nerve stimulators).  Call the office for questions about other devices. °  Avoid electrical appliances that are in poor condition or are not properly grounded. °  Microwave ovens are safe to be near or to operate. ° °Additional information for defibrillator patients should your device go off: °  If your device goes off ONCE and you feel fine afterward, notify the device clinic nurses. °  If your device goes off ONCE and you do not feel well afterward, call 911. °  If your device goes off TWICE, call 911. °  If your device goes off THREE times in one day, call 911. ° °DO NOT DRIVE YOURSELF OR  A FAMILY MEMBER °WITH A DEFIBRILLATOR TO THE HOSPITAL--CALL 911. ° °

## 2018-11-05 NOTE — Plan of Care (Signed)
  Problem: Clinical Measurements: Goal: Ability to maintain clinical measurements within normal limits will improve Outcome: Progressing Goal: Will remain free from infection Outcome: Progressing   Problem: Safety: Goal: Ability to remain free from injury will improve Outcome: Progressing   Problem: Cardiac: Goal: Ability to achieve and maintain adequate cardiopulmonary perfusion will improve Outcome: Progressing

## 2018-11-05 NOTE — Discharge Summary (Addendum)
ELECTROPHYSIOLOGY PROCEDURE DISCHARGE SUMMARY    Patient ID: ERIAL [name],  MRN: [id number], DOB/AGE: 1945/09/04 73 y.o.  Admit date: 11/04/2018 Discharge date: 11/05/2018  Electrophysiologist: Cristopher Peru, MD   Primary Discharge Diagnosis:  Chronic systolic CHF s/p BiV-ICD upgrade this admission  Allergies  Allergen Reactions   Antihistamines, Chlorpheniramine-Type Other (See Comments)    Alters vision   Codeine Other (See Comments)    Confusion and dizziness   Cyclobenzaprine Other (See Comments)    Adverse reaction - back and throat pain, dizziness, exhaustion   Cymbalta [Duloxetine Hcl] Other (See Comments)    "body spasms and made me feel weird in my head"    Furosemide     Severe hypotension    Influenza Vac Split [Flu Virus Vaccine] Other (See Comments)    "allergic to concentrated eggs that are in vaccine"   Prevnar [Pneumococcal 13-Val Conj Vacc] Other (See Comments)    Egg allergy Bad reaction after shot   Saline Rash    FEVER, BURNING   Sulfasalazine Other (See Comments)    "makes my whole body smell like sulfa & makes me sick just smelling it"   Atorvastatin Other (See Comments)    Severe muscle cramps to generic atorvastatin.  Able to take brand lipitor.   Chlorhexidine Other (See Comments)    blisters   Dilaudid [Hydromorphone Hcl] Other (See Comments)    Feels like she is burning internally    Latex Nausea Only, Rash and Other (See Comments)    Pain and infection   Morphine And Related Other (See Comments)    Internally feels like she is on fire    Pravastatin Other (See Comments)    myalgias   Prevacid [Lansoprazole] Other (See Comments)    Worsened GI side effects   Red Dye Nausea Only and Swelling   Rosuvastatin Other (See Comments)    Was not effective controlling lipids   Simvastatin Other (See Comments)    Leg cramps   Diflucan [Fluconazole] Nausea And Vomiting   Onion     Diarrhea, GI distress, flared IBS     Other     NOTE: pt is able to take cephalosporins without reaction   Penicillins Other (See Comments)    CHILDHOOD ALLEGY/REACTION: "told 50 years ago I couldn't take it; might be immune to it", able to take amoxicillin Has patient had a PCN reaction causing immediate rash, facial/tongue/throat swelling, SOB or lightheadedness with hypotension: Unknown Has patient had a PCN reaction causing severe rash involving mucus membranes or skin necrosis: UnknowN Has patient had a PCN reaction that required hospitalization: /Unknown Has patient had a PCN reaction occurring within the last 10 years: Unknown If a   Sertraline Other (See Comments)    Overly sedating   Tape Other (See Comments)    All adhesives  - Blisters, itching and burning    Metaxalone Other (See Comments)    REACTION: unknown     Procedures This Admission:  1.  Implantation of a Medtronic Biv ICD, Z2516458 H on 11/04/2018 by Dr Lovena Le.  The patient received a Medtronic Biv ICD, [id number] H with a Medtronic 5076 atrial lead, # [id number]  and MDT 1194 His bundle lead, #[id number] V. There were no immediate post procedure complications. 2.  CXR on 11/05/18 demonstrated no pneumothorax status post device implantation.   Brief HPI: Ashley [name] is a 73 y.o. female presented for upgrade of her single chamber device to a BiV - ICD in the setting of chronic  systolic CHF. Risks, benefits, and alternatives to BiV implantation were reviewed with the patient who wished to proceed.   Hospital Course:  The patient was admitted and underwent implantation of a BiV MDT ICD with details as outlined above.  She was monitored on telemetry overnight which demonstrated stable device function.  Left chest was without hematoma or ecchymosis.  The device was interrogated and found to be functioning normally.  CXR was obtained and demonstrated no pneumothorax status post device implantation.  Wound care, arm mobility, and restrictions were  reviewed with the patient.  The patient was examined and considered stable for discharge to home.   Physical Exam: Vitals:   11/04/18 1757 11/04/18 2018 11/05/18 0334 11/05/18 0341  BP: 96/62 (!) 92/54 99/63   Pulse: 89 88 79   Resp:  (!) 22    Temp: 98 F (36.7 C) 98.1 F (36.7 C) (!) 97.3 F (36.3 C)   TempSrc: Oral Oral Oral   SpO2: 99% 94% 94%   Weight:    79.1 kg  Height:        GEN- The patient is well appearing, alert and oriented x 3 today.   HEENT: normocephalic, atraumatic; sclera clear, conjunctiva pink; hearing intact; oropharynx clear; neck supple, no JVP Lymph- no cervical lymphadenopathy Lungs- Clear to ausculation bilaterally, normal work of breathing.  No wheezes, rales, rhonchi Heart- Regular rate and rhythm, no murmurs, rubs or gallops, PMI not laterally displaced GI- soft, non-tender, non-distended, bowel sounds present, no hepatosplenomegaly Extremities- no clubbing, cyanosis, or edema; DP/PT/radial pulses 2+ bilaterally MS- no significant deformity or atrophy Skin- warm and dry, no rash or lesion, left chest without hematoma/ecchymosis Psych- euthymic mood, full affect Neuro- strength and sensation are intact  Labs:   Lab Results  Component Value Date   WBC 13.1 (H) 10/19/2018   HGB 13.9 10/19/2018   HCT 42.2 10/19/2018   MCV 91.7 10/19/2018   PLT 199 10/19/2018   No results for input(s): NA, K, CL, CO2, BUN, CREATININE, CALCIUM, PROT, BILITOT, ALKPHOS, ALT, AST, GLUCOSE in the last 168 hours.  Invalid input(s): LABALBU  Discharge Medications:  Allergies as of 11/05/2018      Reactions   Antihistamines, Chlorpheniramine-type Other (See Comments)   Alters vision   Codeine Other (See Comments)   Confusion and dizziness   Cyclobenzaprine Other (See Comments)   Adverse reaction - back and throat pain, dizziness, exhaustion   Cymbalta [duloxetine Hcl] Other (See Comments)   "body spasms and made me feel weird in my head"    Furosemide    Severe  hypotension    Influenza Vac Split [flu Virus Vaccine] Other (See Comments)   "allergic to concentrated eggs that are in vaccine"   Prevnar [pneumococcal 13-val Conj Vacc] Other (See Comments)   Egg allergy Bad reaction after shot   Saline Rash   FEVER, BURNING   Sulfasalazine Other (See Comments)   "makes my whole body smell like sulfa & makes me sick just smelling it"   Atorvastatin Other (See Comments)   Severe muscle cramps to generic atorvastatin.  Able to take brand lipitor.   Chlorhexidine Other (See Comments)   blisters   Dilaudid [hydromorphone Hcl] Other (See Comments)   Feels like she is burning internally    Latex Nausea Only, Rash, Other (See Comments)   Pain and infection   Morphine And Related Other (See Comments)   Internally feels like she is on fire    Pravastatin Other (See Comments)   myalgias  Prevacid [lansoprazole] Other (See Comments)   Worsened GI side effects   Red Dye Nausea Only, Swelling   Rosuvastatin Other (See Comments)   Was not effective controlling lipids   Simvastatin Other (See Comments)   Leg cramps   Diflucan [fluconazole] Nausea And Vomiting   Onion    Diarrhea, GI distress, flared IBS   Other    NOTE: pt is able to take cephalosporins without reaction   Penicillins Other (See Comments)   CHILDHOOD ALLEGY/REACTION: "told 50 years ago I couldn't take it; might be immune to it", able to take amoxicillin Has patient had a PCN reaction causing immediate rash, facial/tongue/throat swelling, SOB or lightheadedness with hypotension: Unknown Has patient had a PCN reaction causing severe rash involving mucus membranes or skin necrosis: UnknowN Has patient had a PCN reaction that required hospitalization: /Unknown Has patient had a PCN reaction occurring within the last 10 years: Unknown If a   Sertraline Other (See Comments)   Overly sedating   Tape Other (See Comments)   All adhesives  - Blisters, itching and burning   Metaxalone Other  (See Comments)   REACTION: unknown      Medication List    TAKE these medications   albuterol 108 (90 Base) MCG/ACT inhaler Commonly known as: VENTOLIN HFA TAKE 2 PUFFS BY MOUTH EVERY 6 HOURS AS NEEDED FOR WHEEZE OR SHORTNESS OF BREATH What changed: See the new instructions.   AMBULATORY NON FORMULARY MEDICATION Medication Name:  1 squatty potty to use as needed   aspirin EC 81 MG tablet Take 81 mg by mouth at bedtime.   benzonatate 200 MG capsule Commonly known as: TESSALON Take 1 capsule (200 mg total) by mouth 3 (three) times daily as needed for cough.   BIOTIN PO Take 1 tablet by mouth daily.   carvedilol 3.125 MG tablet Commonly known as: COREG Take 1 tablet (3.125 mg total) by mouth 2 (two) times daily with a meal for 30 days.   cholecalciferol 1000 units tablet Commonly known as: VITAMIN D Take 1,000 Units by mouth daily.   CoQ-10 100 MG Caps Take 100 mg by mouth daily.   dextromethorphan 30 MG/5ML liquid Commonly known as: DELSYM Take 15 mg by mouth daily as needed for cough.   ezetimibe 10 MG tablet Commonly known as: ZETIA TAKE 1 TABLET (10 MG TOTAL) BY MOUTH DAILY. What changed: See the new instructions.   [employer] Mouthwash Susp Swish and swallow 5 mLs every 6 (six) hours as needed (mouth blisters).   FISH OIL PO Take 1 capsule by mouth daily.   furosemide 20 MG tablet Commonly known as: LASIX Take 1-2 tablets (20-40 mg total) by mouth as needed (As needed for weight gain or shortness of breath). Take with potassium What changed:   how much to take  when to take this   GARLIC PO Take 1 capsule by mouth daily.   guaiFENesin 600 MG 12 hr tablet Commonly known as: MUCINEX Take 600 mg by mouth daily.   levalbuterol 45 MCG/ACT inhaler Commonly known as: Xopenex HFA Inhale 2 puffs into the lungs every 4 (four) hours as needed for wheezing or shortness of breath.   losartan 25 MG tablet Commonly known as: COZAAR Take 0.5 tablet (12.5  mg) by mouth once daily at bedtime if the top number on your blood pressure is >90.   miconazole 2 % powder Commonly known as: MICOTIN Apply 1 application topically as needed (yeast).   nystatin cream Commonly known as: MYCOSTATIN  Apply 1 application topically 2 (two) times daily. What changed:   when to take this  reasons to take this   potassium chloride 10 MEQ tablet Commonly known as: K-DUR Take 10 mEq by mouth daily as needed (with laxis).   pyridOXINE 100 MG tablet Commonly known as: VITAMIN B-6 Take 100 mg by mouth daily.   RED YEAST RICE PO Take 1 capsule by mouth daily.   spironolactone 25 MG tablet Commonly known as: ALDACTONE Take 0.5 tablets (12.5 mg total) by mouth daily for 30 days.   TYLENOL 500 MG tablet Generic drug: acetaminophen Take 500 mg by mouth daily as needed for moderate pain or headache (PAIN).   vitamin C 1000 MG tablet Take 1,000 mg by mouth See admin instructions. Take 1000 mg daily, may take an additional 1000 mg as needed for cold symptoms   vitamin E 400 UNIT capsule Take 400 Units by mouth daily.      Disposition:   Follow-up Information     [facility] Follow up on 11/19/2018.   Why: at 0900 for Device wound check. Please don't arrive any sooner than 0845. Contact information: [place] [address] [phone]        Pt will be discharged home in stable condition with follow up as above.   Duration of Discharge Encounter: Greater than 30 minutes including physician time.  Jacalyn Lefevre, PA-C  11/05/2018 7:32 AM  EP Attending  Patient seen and examined. Agree with above. The patient has undergone insertion of a BiV ICD. Interrogation of her device demonstrates normal device function. She will be discharged with the usual followup.  Mikle Bosworth.D.

## 2018-11-08 ENCOUNTER — Telehealth: Payer: Self-pay | Admitting: Internal Medicine

## 2018-11-08 NOTE — Telephone Encounter (Signed)
No answer. Left message to call back.   

## 2018-11-08 NOTE — Telephone Encounter (Signed)
Patient returning call.

## 2018-11-08 NOTE — Telephone Encounter (Signed)
New Message       Pt c/o swelling: STAT is pt has developed SOB within 24 hours  1) How much weight have you gained and in what time span? 3lbs in a day   2) If swelling, where is the swelling located? No signs of swelling just weight gain  3) Are you currently taking a fluid pill? Lasix  4) Are you currently SOB? Yes   5) Do you have a log of your daily weights (if so, list)? 170,  174, 171, and 170  6) Have you gained 3 pounds in a day or 5 pounds in a week? 3lbs in a day  7) Have you traveled recently? No

## 2018-11-08 NOTE — H&P (Signed)
ICD Criteria  Current LVEF:30%. Within 12 months prior to implant: Yes   Heart failure history: Yes, Class III  Cardiomyopathy history: Yes, Non-Ischemic Cardiomyopathy.  Atrial Fibrillation/Atrial Flutter: No.  Ventricular tachycardia history: No.  Cardiac arrest history: No.  History of syndromes with risk of sudden death: No.  Previous ICD: Yes, Reason for ICD:  Primary prevention.  Current ICD indication: Primary  PPM indication: No.  Class I or II Bradycardia indication present: Yes  Beta Blocker therapy for 3 or more months: Yes, prescribed.   Ace Inhibitor/ARB therapy for 3 or more months: Yes, prescribed.    I have seen Ashley [name] is a 73 y.o. femalepre-procedural and has been referred by Dr. Candis Musa for consideration of ICD implant for primary prevention of sudden death. She is referred to upgrade from a single to a biv ICD due to her LBBB and chronic systolic heart failure.  The patient's chart has been reviewed and they meet criteria for ICD implant.  I have had a thorough discussion with the patient reviewing options.  The patient and their family (if available) have had opportunities to ask questions and have them answered. The patient and I have decided together through the [facility] to proceed with ICD at this time.  Risks, benefits, alternatives to ICD implantation were discussed in detail with the patient today. The patient  understands that the risks include but are not limited to bleeding, infection, pneumothorax, perforation, tamponade, vascular damage, renal failure, MI, stroke, death, inappropriate shocks, and lead dislodgement and she wishes to proceed.

## 2018-11-11 ENCOUNTER — Encounter (HOSPITAL_COMMUNITY): Payer: Self-pay | Admitting: Internal Medicine

## 2018-11-11 NOTE — Telephone Encounter (Signed)
Returned call to patient. She reports that she had a pacemaker revision on 6/22 with Dr. Lovena Le. She was d/c on 6/23.   On Friday night she had 7 lb weight gain. 20 mg Lasix was recently prescribed to her (previously Torsemide) to use PRN. She took 2 (20 mg total) doses of Lasix, 12 hours apart, on Saturday and was down to BL weight.   Pt reports episode of dizziness, seeing stars and chest pain on Sunday. She was assisted by med team at her residence after pacer alarm went off.   Pt does not feel she has recovered well after procedure. She does not feel lasix is compatible with her hx of multiple ax.  She has since taken 0.5 tab of Lasix (10 mg total) BID, along with 0.5 tab K CL. Weight today is 175 lbs.   She denies fevers, LE swelling or inc SOB. She does have BL SOB d/t COPD.   Pt request OV with Dr. [name]. Appt moved to tomorrow 6/30 @ 2:40 PM.  Reviewed office procedure for CV 19. Pt verbally agreed.      COVID-19 Pre-Screening Questions:  . In the past 7 to 10 days have you had a cough,  shortness of breath, headache, congestion, fever (100 or greater) body aches, chills, sore throat, or sudden loss of taste or sense of smell? No new SOB, no to other sx  . Have you been around anyone with known Covid 19. NO . Have you been around anyone who is awaiting Covid 19 test results in the past 7 to 10 days? NO . Have you been around anyone who has been exposed to Covid 19, or has mentioned symptoms of Covid 19 within the past 7 to 10 days? NO  If you have any concerns/questions about symptoms patients report during screening (either on the phone or at threshold). Contact the provider seeing the patient or DOD for further guidance.  If neither are available contact a member of the leadership team.       Routed to Dr. [name] to review prior to appt and request any further advice.   Advised pt to call for any further questions or concerns.

## 2018-11-11 NOTE — Telephone Encounter (Signed)
Patient returning call.
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
## 2018-11-12 ENCOUNTER — Encounter: Payer: Self-pay | Admitting: Cardiovascular Disease

## 2018-11-12 ENCOUNTER — Ambulatory Visit (INDEPENDENT_AMBULATORY_CARE_PROVIDER_SITE_OTHER): Payer: Medicare Other | Admitting: Cardiovascular Disease

## 2018-11-12 ENCOUNTER — Other Ambulatory Visit: Payer: Self-pay

## 2018-11-12 VITALS — BP 106/66 | HR 94 | Ht 65.0 in | Wt 173.0 lb

## 2018-11-12 DIAGNOSIS — I10 Essential (primary) hypertension: Secondary | ICD-10-CM | POA: Diagnosis not present

## 2018-11-12 DIAGNOSIS — E782 Mixed hyperlipidemia: Secondary | ICD-10-CM | POA: Diagnosis not present

## 2018-11-12 DIAGNOSIS — I420 Dilated cardiomyopathy: Secondary | ICD-10-CM

## 2018-11-12 DIAGNOSIS — I5022 Chronic systolic (congestive) heart failure: Secondary | ICD-10-CM | POA: Diagnosis not present

## 2018-11-12 DIAGNOSIS — F411 Generalized anxiety disorder: Secondary | ICD-10-CM

## 2018-11-12 DIAGNOSIS — Z95810 Presence of automatic (implantable) cardiac defibrillator: Secondary | ICD-10-CM | POA: Diagnosis not present

## 2018-11-12 NOTE — Patient Instructions (Addendum)
Medication Instructions:  Stay on losartan, spironolactone, lasix , coreg  If you need a refill on your cardiac medications before your next appointment, please call your pharmacy.    Lab work: No new labs needed   If you have labs (blood work) drawn today and your tests are completely normal, you will receive your results only by: Marland Kitchen MyChart Message (if you have MyChart) OR . A paper copy in the mail If you have any lab test that is abnormal or we need to change your treatment, we will call you to review the results.   Testing/Procedures: No new testing needed   Follow-Up: At [facility], you and your health needs are our priority.  As part of our continuing mission to provide you with exceptional heart care, we have created designated Provider Care Teams.  These Care Teams include your primary Cardiologist (physician) and Advanced Practice Providers (APPs -  Physician Assistants and Nurse Practitioners) who all work together to provide you with the care you need, when you need it.  . You will need a follow up appointment in 6 months .   Please call our office 2 months in advance to schedule this appointment.    . Providers on your designated Care Team:   . Murray Hodgkins, NP . Christell Faith, PA-C . Marrianne Mood, PA-C  Any Other Special Instructions Will Be Listed Below (If Applicable).  For educational health videos Log in to : [email or web address] Or : [email or web address], password : [email or web address]

## 2018-11-13 ENCOUNTER — Telehealth: Payer: Self-pay

## 2018-11-13 MED ORDER — LORAZEPAM 0.5 MG PO TABS: 0.2500 mg | ORAL_TABLET | Freq: Every evening | ORAL | 1 refills | Status: DC | PRN

## 2018-11-13 NOTE — Telephone Encounter (Signed)
Pt said had to have pacemaker upgraded and now pt has 4 wires instead of 2 wires. Pt saw Dr [name] yesterday and meds were adjusted. Pt is supposed to self quarantine for 3 months after pacemaker surgery. Pt said she does not know if can continue to be by herself; starting to make pt anxious. Pt request low dose med for anxiety and pt cannot go to sleep at night. Pt usually goes to sleep between 1:30 - 3:00 AM. Pt used to take low dose ambien that worked pretty well. [facility] [address]Please advise.

## 2018-11-13 NOTE — Telephone Encounter (Signed)
Would recommend we try ativan (lorazepam) instead of ambien - take 1/2- 1 tab at night as needed for sleep. This can help sleep and anxiety but it is a habit forming medicine (but so is [place]). Hopeful for just temporary use.  Let us know how she does with this. Sent to pharmacy.

## 2018-11-13 NOTE — Telephone Encounter (Signed)
Spoke with pt relaying Dr. G's message. Pt verbalizes understanding.  

## 2018-11-18 ENCOUNTER — Telehealth: Payer: Self-pay

## 2018-11-18 NOTE — Telephone Encounter (Signed)
Appointment 11-19-2018 at 9 am      COVID-19 Pre-Screening Questions:  . In the past 7 to 10 days have you had a cough,  shortness of breath, headache, congestion, fever (100 or greater) body aches, chills, sore throat, or sudden loss of taste or sense of smell? No . Have you been around anyone with known Covid 19. No . Have you been around anyone who is awaiting Covid 19 test results in the past 7 to 10 days?No . Have you been around anyone who has been exposed to Covid 19, or has mentioned symptoms of Covid 19 within the past 7 to 10 days? NO  If you have any concerns/questions about symptoms patients report during screening (either on the phone or at threshold). Contact the provider seeing the patient or DOD for further guidance.  If neither are available contact a member of the leadership team.        Pt answered No to all Covid-19 prescreening questions. I told her to wear a mask and if she can physically come to the appointment alone to please do so. I let her know we are trying to reduce the number of people coming into the office. I told her if anything changes between now and her appointment to please call the office. Pt verbalized understanding.

## 2018-11-19 ENCOUNTER — Ambulatory Visit (INDEPENDENT_AMBULATORY_CARE_PROVIDER_SITE_OTHER): Payer: Medicare Other | Admitting: *Deleted

## 2018-11-19 ENCOUNTER — Other Ambulatory Visit: Payer: Self-pay

## 2018-11-19 ENCOUNTER — Encounter: Payer: Self-pay | Admitting: Internal Medicine

## 2018-11-19 DIAGNOSIS — I5043 Acute on chronic combined systolic (congestive) and diastolic (congestive) heart failure: Secondary | ICD-10-CM

## 2018-11-19 LAB — CUP PACEART INCLINIC DEVICE CHECK
Battery Remaining Longevity: 74 mo
Battery Voltage: 3.03 V
Brady Statistic AP VP Percent: 0.03 %
Brady Statistic AP VS Percent: 0.01 %
Brady Statistic AS VP Percent: 99.78 %
Brady Statistic AS VS Percent: 0.18 %
Brady Statistic RA Percent Paced: 0.04 %
Brady Statistic RV Percent Paced: 99.68 %
Date Time Interrogation Session: 20200707130743
HighPow Impedance: 71 Ohm
Implantable Lead Implant Date: 20130301
Implantable Lead Implant Date: 20200622
Implantable Lead Implant Date: 20200622
Implantable Lead Location: 753859
Implantable Lead Location: 753860
Implantable Lead Location: 753860
Implantable Lead Model: 3830
Implantable Lead Model: 5076
Implantable Lead Model: 6935
Implantable Pulse Generator Implant Date: 20200622
Lead Channel Impedance Value: 304 Ohm
Lead Channel Impedance Value: 399 Ohm
Lead Channel Impedance Value: 437 Ohm
Lead Channel Impedance Value: 437 Ohm
Lead Channel Impedance Value: 456 Ohm
Lead Channel Impedance Value: 589 Ohm
Lead Channel Pacing Threshold Amplitude: 0.375 V
Lead Channel Pacing Threshold Amplitude: 0.5 V
Lead Channel Pacing Threshold Amplitude: 0.625 V
Lead Channel Pacing Threshold Pulse Width: 0.4 ms
Lead Channel Pacing Threshold Pulse Width: 0.4 ms
Lead Channel Pacing Threshold Pulse Width: 0.4 ms
Lead Channel Sensing Intrinsic Amplitude: 12.625 mV
Lead Channel Sensing Intrinsic Amplitude: 2.75 mV
Lead Channel Sensing Intrinsic Amplitude: 2.875 mV
Lead Channel Sensing Intrinsic Amplitude: 9.875 mV
Lead Channel Setting Pacing Amplitude: 2.5 V
Lead Channel Setting Pacing Amplitude: 3.5 V
Lead Channel Setting Pacing Amplitude: 3.5 V
Lead Channel Setting Pacing Pulse Width: 0.4 ms
Lead Channel Setting Pacing Pulse Width: 0.4 ms
Lead Channel Setting Sensing Sensitivity: 0.3 mV

## 2018-11-19 NOTE — Progress Notes (Signed)
Wound check appointment. Incision approximated, no edema or redness. Wound healing well. CRT-D device check in office. Chronic RV lead in RV port, HIS lead in LV port. Thresholds and sensing consistent with previous device measurements. Lead impedance trends stable over time. No mode switch episodes recorded. No ventricular arrhythmia episodes recorded. Device programmed with appropriate safety margins. RV amplitude decreased to 2.5V, HIS lead amplitude increased to 3.5V (monitor only), and RV acute phase turned off. Estimated longevity 7.4 years. Patient enrolled in remote follow up. Next remote scheduled 02/04/19 & ROV 02/06/19. Patient education completed including shock plan.

## 2018-11-20 ENCOUNTER — Telehealth: Payer: Self-pay | Admitting: Internal Medicine

## 2018-11-20 ENCOUNTER — Ambulatory Visit (INDEPENDENT_AMBULATORY_CARE_PROVIDER_SITE_OTHER): Payer: Medicare Other | Admitting: *Deleted

## 2018-11-20 DIAGNOSIS — I420 Dilated cardiomyopathy: Secondary | ICD-10-CM

## 2018-11-20 DIAGNOSIS — I5022 Chronic systolic (congestive) heart failure: Secondary | ICD-10-CM

## 2018-11-20 LAB — CUP PACEART REMOTE DEVICE CHECK
Battery Remaining Longevity: 71 mo
Battery Voltage: 3.01 V
Brady Statistic AP VP Percent: 0.03 %
Brady Statistic AP VS Percent: 0.01 %
Brady Statistic AS VP Percent: 99.8 %
Brady Statistic AS VS Percent: 0.16 %
Brady Statistic RA Percent Paced: 0.04 %
Brady Statistic RV Percent Paced: 99.7 %
Date Time Interrogation Session: 20200708175115
HighPow Impedance: 72 Ohm
Implantable Lead Implant Date: 20130301
Implantable Lead Implant Date: 20200622
Implantable Lead Implant Date: 20200622
Implantable Lead Location: 753859
Implantable Lead Location: 753860
Implantable Lead Location: 753860
Implantable Lead Model: 3830
Implantable Lead Model: 5076
Implantable Lead Model: 6935
Implantable Pulse Generator Implant Date: 20200622
Lead Channel Impedance Value: 266 Ohm
Lead Channel Impedance Value: 342 Ohm
Lead Channel Impedance Value: 380 Ohm
Lead Channel Impedance Value: 399 Ohm
Lead Channel Impedance Value: 494 Ohm
Lead Channel Impedance Value: 532 Ohm
Lead Channel Pacing Threshold Amplitude: 0.5 V
Lead Channel Pacing Threshold Amplitude: 0.5 V
Lead Channel Pacing Threshold Amplitude: 0.75 V
Lead Channel Pacing Threshold Pulse Width: 0.4 ms
Lead Channel Pacing Threshold Pulse Width: 0.4 ms
Lead Channel Pacing Threshold Pulse Width: 0.4 ms
Lead Channel Sensing Intrinsic Amplitude: 1.875 mV
Lead Channel Sensing Intrinsic Amplitude: 1.875 mV
Lead Channel Sensing Intrinsic Amplitude: 12.25 mV
Lead Channel Sensing Intrinsic Amplitude: 12.25 mV
Lead Channel Setting Pacing Amplitude: 2.5 V
Lead Channel Setting Pacing Amplitude: 3.5 V
Lead Channel Setting Pacing Amplitude: 3.5 V
Lead Channel Setting Pacing Pulse Width: 0.4 ms
Lead Channel Setting Pacing Pulse Width: 0.4 ms
Lead Channel Setting Sensing Sensitivity: 0.3 mV

## 2018-11-20 NOTE — Telephone Encounter (Signed)
LMOVM requesting call back to DC to discuss symptoms. Gave direct number for return call.

## 2018-11-20 NOTE — Telephone Encounter (Signed)
Patient returned call. Pt reports that she is discontented with her appointment yesterday. She expressed that she felt excluded from the conversation in the room due to the device RN training occurring during her visit. She is concerned that the [facility] room is too small to social distance and upset that we would not keep the door fully open during her appointment. Concerned about [place] offering paper handouts and magazines in the room. Also requested clarification about our COVID cleaning protocols.  Pt reported a "fibromyalgia attack" due to feeling the lobby floor "vibrating", as well as a headache and nausea that developed during visit yesterday. She reports she was pleased that I offered her diet ginger ale, but felt uncomfortable the rest of the day and is concerned that this was related to her device testing.   Advised pt of our commitment to cleaning and sanitizing due to [place]. Discussed our cleaning/disinfectant, universal masking, and hand hygiene protocols. Explained paper handouts are printed at the time of the visit and that we could not keep the door open during her visit due to privacy and the need for an EKG. Reassured pt that we practice social distancing whenever possible, but that we have some limitations during DC visits due to the equipment we utilize. I thanked her for noticing the magazines and explained they have since been removed.   Reassured pt that we warn patients about sensing/threshold tests as they can cause transient lightheadedness/dizziness or palpitations for some patients, but I am not aware of these tests causing lasting symptoms. Explained device check was stable and no changes were made. Offered to review a manual transmission from her home monitor today to confirm normal device function.  Prior to disconnecting call, I confirmed all of pt's questions were answered satisfactorily. She thanked me for taking the time to listen as she has had heightened anxiety due to  COVID and the stay-at-home orders. She reports this conversation has been very helpful for her and she feels much better now. Advised I will call back after reviewing her manual transmission this afternoon and pt is agreeable to this plan.

## 2018-11-20 NOTE — Telephone Encounter (Signed)
Transmission received and reviewed. Normal device function. Presenting rhythm As/BiVp @ 109bpm. Lead trends stable. Histograms appropriate.   Advised pt of findings. Pt agrees to keep f/u with Dr. Lovena Le on 02/06/19. No further questions at this time.

## 2018-11-20 NOTE — Telephone Encounter (Signed)
°  1. Has your device fired? No  2. Is you device beeping? no  3. Are you experiencing draining or swelling at device site? no  4. Are you calling to see if we received your device transmission? no  5. Have you passed out? No  Patient states she came in for a wound check yesterday. She states she felt awful yesterday after the visit, she doesn't know if they were nurses or tech but there was no communication.      Please route to Ashley [name]

## 2018-11-26 ENCOUNTER — Ambulatory Visit: Payer: Medicare Other | Admitting: Cardiovascular Disease

## 2018-11-27 ENCOUNTER — Other Ambulatory Visit: Payer: Self-pay

## 2018-11-27 ENCOUNTER — Ambulatory Visit (INDEPENDENT_AMBULATORY_CARE_PROVIDER_SITE_OTHER): Payer: Medicare Other | Admitting: Family Medicine

## 2018-11-27 ENCOUNTER — Ambulatory Visit: Payer: Medicare Other

## 2018-11-27 ENCOUNTER — Encounter: Payer: Self-pay | Admitting: Family Medicine

## 2018-11-27 VITALS — BP 120/74 | HR 82 | Temp 97.6°F | Ht 64.5 in | Wt 175.1 lb

## 2018-11-27 DIAGNOSIS — Z95810 Presence of automatic (implantable) cardiac defibrillator: Secondary | ICD-10-CM | POA: Diagnosis not present

## 2018-11-27 DIAGNOSIS — M810 Age-related osteoporosis without current pathological fracture: Secondary | ICD-10-CM

## 2018-11-27 DIAGNOSIS — F411 Generalized anxiety disorder: Secondary | ICD-10-CM | POA: Diagnosis not present

## 2018-11-27 DIAGNOSIS — I420 Dilated cardiomyopathy: Secondary | ICD-10-CM | POA: Diagnosis not present

## 2018-11-27 DIAGNOSIS — F331 Major depressive disorder, recurrent, moderate: Secondary | ICD-10-CM | POA: Diagnosis not present

## 2018-11-27 DIAGNOSIS — Z0000 Encounter for general adult medical examination without abnormal findings: Secondary | ICD-10-CM

## 2018-11-27 DIAGNOSIS — Z7189 Other specified counseling: Secondary | ICD-10-CM

## 2018-11-27 DIAGNOSIS — N183 Chronic kidney disease, stage 3 unspecified: Secondary | ICD-10-CM

## 2018-11-27 DIAGNOSIS — I5022 Chronic systolic (congestive) heart failure: Secondary | ICD-10-CM

## 2018-11-27 DIAGNOSIS — E559 Vitamin D deficiency, unspecified: Secondary | ICD-10-CM

## 2018-11-27 DIAGNOSIS — E782 Mixed hyperlipidemia: Secondary | ICD-10-CM

## 2018-11-27 DIAGNOSIS — M797 Fibromyalgia: Secondary | ICD-10-CM

## 2018-11-27 DIAGNOSIS — R7303 Prediabetes: Secondary | ICD-10-CM

## 2018-11-27 NOTE — Assessment & Plan Note (Signed)
See above. Declines medication for now. Some situational depression.

## 2018-11-27 NOTE — Progress Notes (Signed)
This visit was conducted in person.  BP 120/74 (BP Location: Right Arm, Patient Position: Sitting, Cuff Size: Normal)    Pulse 82    Temp 97.6 F (36.4 C) (Temporal)    Ht 5' 4.5" (1.638 m)    Wt 175 lb 1 oz (79.4 kg)    SpO2 98%    BMI 29.59 kg/m    CC: AMW Subjective:    Patient ID: Ashley [name], female    DOB: [birth year], 73 y.o.   MRN: [id number]  HPI: Ashley [name] is a 73 y.o. female presenting on 11/27/2018 for Medicare Wellness   Did not see Katha Cabal this year.   Hearing Screening   125Hz  250Hz  500Hz  1000Hz  2000Hz  3000Hz  4000Hz  6000Hz  8000Hz   Right ear:   40 0 40  0    Left ear:   40 0 40  0    Vision Screening Comments: Last eye exam, 07/2017 has seen audiologist.  Passes depression screen  Fall risk screen - several falls this past year without injury.   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 Cardiac catheterization showed non obstructive coronaries. Pacemaker was upgraded to [employer] ICD. Spironolactone and losartan (both low dose) have been restarted.   Taking lorazepam 0.5mg  1/2 tab at night to help sleep. Whole tablet is overly sedating as well. Intolerant to cymbalta and zoloft in the past. Endorses depressed mood in afternoons.   Not fasting today.   Preventative: COLONOSCOPY 01/2018 - 1 small benign polyp, diverticulosis, healthy anastomosis [name] @ [facility])  Lung cancer screening - not indicated  Breast cancer screening - normal 01/2014, pt states normal 2019 (no records available).  DEXA through [place] - T -2.7 spine (01/2016) - on fosamax since at least 10 yrs along with calcium + vitamin D Well woman exam with [facility]) - s/p abd hysterectomy with BSO.  Flu shot - declines - egg allergy Td 2007 Pneumovax 2013, prevnar 2017 - bad reaction to this shingrix - discussed, declines  Advanced directive discussion - has at home. Husband is HCPOA.  Asked to bring [place] a copy.  Seat belt use discussed  Sunscreen use discussed. No changing moles on skin. Sees derm regularly. Non smoker  Alcohol - none Dentist - overdue Eye exam - yearly Bowel - alternates constipation / diarrhea Bladder - no incontinence   Lives with husband, Herbie [place]. 1 adopted child Activity: no regular exercise - limited by pain Diet: seldom water, good fruits/vegetables     Relevant past medical, surgical, family and social history reviewed and updated as indicated. Interim medical history since our last visit reviewed. Allergies and medications reviewed and updated. Outpatient Medications Prior to Visit  Medication Sig Dispense Refill   acetaminophen (TYLENOL) 500 MG tablet Take 500 mg by mouth daily as needed for moderate pain or headache (PAIN).      albuterol (PROVENTIL HFA;VENTOLIN HFA) 108 (90 Base) MCG/ACT inhaler TAKE 2 PUFFS BY MOUTH EVERY 6 HOURS AS NEEDED FOR WHEEZE OR SHORTNESS OF BREATH (Patient taking differently: Inhale 2 puffs into the lungs every 6 (six) hours as needed for wheezing or shortness of breath. ) 18 Inhaler 1   AMBULATORY NON FORMULARY MEDICATION Medication Name:  1 squatty potty to use as needed 1 each 0   Ascorbic Acid (VITAMIN C) 1000 MG tablet Take 1,000 mg by mouth See admin instructions. Take 1000 mg daily, may take an additional 1000 mg as  needed for cold symptoms     aspirin EC 81 MG tablet Take 81 mg by mouth at bedtime.      benzonatate (TESSALON) 200 MG capsule Take 1 capsule (200 mg total) by mouth 3 (three) times daily as needed for cough. 20 capsule 0   BIOTIN PO Take 1 tablet by mouth daily.     cholecalciferol (VITAMIN D) 1000 units tablet Take 1,000 Units by mouth daily.      Coenzyme Q10 (COQ-10) 100 MG CAPS Take 100 mg by mouth daily.     dextromethorphan (DELSYM) 30 MG/5ML liquid Take 15 mg by mouth daily as needed for cough.      Diphenhyd-Hydrocort-Nystatin (FIRST-DUKES MOUTHWASH) SUSP Swish and swallow 5  mLs every 6 (six) hours as needed (mouth blisters).      ezetimibe (ZETIA) 10 MG tablet TAKE 1 TABLET (10 MG TOTAL) BY MOUTH DAILY. (Patient taking differently: Take 10 mg by mouth at bedtime. ) 90 tablet 2   furosemide (LASIX) 20 MG tablet Take 1-2 tablets (20-40 mg total) by mouth as needed (As needed for weight gain or shortness of breath). Take with potassium (Patient taking differently: Take 20 mg by mouth 2 (two) times daily as needed (As needed for weight gain or shortness of breath). Take with potassium) 90 tablet 3   GARLIC PO Take 1 capsule by mouth daily.     guaiFENesin (MUCINEX) 600 MG 12 hr tablet Take 600 mg by mouth daily.     levalbuterol (XOPENEX HFA) 45 MCG/ACT inhaler Inhale 2 puffs into the lungs every 4 (four) hours as needed for wheezing or shortness of breath. 1 Inhaler 3   LORazepam (ATIVAN) 0.5 MG tablet Take 0.5-1 tablets (0.25-0.5 mg total) by mouth at bedtime as needed for anxiety or sleep. 30 tablet 1   losartan (COZAAR) 25 MG tablet Take 0.5 tablet (12.5 mg) by mouth once daily at bedtime if the top number on your blood pressure is >90.     miconazole (MICOTIN) 2 % powder Apply 1 application topically as needed (yeast).     nystatin cream (MYCOSTATIN) Apply 1 application topically 2 (two) times daily. (Patient taking differently: Apply 1 application topically as needed (yeast). ) 60 g 1   Omega-3 Fatty Acids (FISH OIL PO) Take 1 capsule by mouth daily.     potassium chloride (K-DUR) 10 MEQ tablet Take 10 mEq by mouth daily as needed (with laxis).      pyridOXINE (VITAMIN B-6) 100 MG tablet Take 100 mg by mouth daily.     Red Yeast Rice Extract (RED YEAST RICE PO) Take 1 capsule by mouth daily.     vitamin E 400 UNIT capsule Take 400 Units by mouth daily.     carvedilol (COREG) 3.125 MG tablet Take 1 tablet (3.125 mg total) by mouth 2 (two) times daily with a meal for 30 days. 60 tablet 0   spironolactone (ALDACTONE) 25 MG tablet Take 0.5 tablets (12.5 mg  total) by mouth daily for 30 days. 15 tablet 0   No facility-administered medications prior to visit.      Per HPI unless specifically indicated in ROS section below Review of Systems Objective:    BP 120/74 (BP Location: Right Arm, Patient Position: Sitting, Cuff Size: Normal)    Pulse 82    Temp 97.6 F (36.4 C) (Temporal)    Ht 5' 4.5" (1.638 m)    Wt 175 lb 1 oz (79.4 kg)    SpO2 98%  BMI 29.59 kg/m   Wt Readings from Last 3 Encounters:  11/27/18 175 lb 1 oz (79.4 kg)  11/12/18 173 lb (78.5 kg)  11/05/18 174 lb 4.8 oz (79.1 kg)    Physical Exam Vitals signs and nursing note reviewed.  Constitutional:      General: She is not in acute distress.    Appearance: Normal appearance. She is well-developed. She is not ill-appearing.  HENT:     Head: Normocephalic and atraumatic.     Right Ear: Hearing, tympanic membrane, ear canal and external ear normal.     Left Ear: Hearing, tympanic membrane, ear canal and external ear normal.     Nose: Nose normal.     Mouth/Throat:     Mouth: Mucous membranes are moist.     Pharynx: Uvula midline. No oropharyngeal exudate or posterior oropharyngeal erythema.  Eyes:     General: No scleral icterus.    Conjunctiva/sclera: Conjunctivae normal.     Pupils: Pupils are equal, round, and reactive to light.  Neck:     Musculoskeletal: Normal range of motion and neck supple.  Cardiovascular:     Rate and Rhythm: Normal rate and regular rhythm.     Pulses: Normal pulses.          Radial pulses are 2+ on the right side and 2+ on the left side.     Heart sounds: Normal heart sounds. No murmur.  Pulmonary:     Effort: Pulmonary effort is normal. No respiratory distress.     Breath sounds: Normal breath sounds. No wheezing, rhonchi or rales.  Abdominal:     General: Abdomen is flat. Bowel sounds are normal. There is no distension.     Palpations: Abdomen is soft. There is no mass.     Tenderness: There is no abdominal tenderness. There is no  guarding or rebound.     Hernia: No hernia is present.  Musculoskeletal: Normal range of motion.     Right lower leg: No edema.     Left lower leg: No edema.  Lymphadenopathy:     Cervical: No cervical adenopathy.  Skin:    General: Skin is warm and dry.     Findings: No rash.  Neurological:     General: No focal deficit present.     Mental Status: She is alert and oriented to person, place, and time.     Comments:  CN grossly intact, station and gait intact Recall 3/3 Calculation 3/5 serial 7s  Psychiatric:        Mood and Affect: Mood normal.        Behavior: Behavior normal.        Thought Content: Thought content normal.        Judgment: Judgment normal.       Assessment & Plan:   Problem List Items Addressed This Visit    Vitamin D deficiency   Relevant Orders   VITAMIN D 25 Hydroxy (Vit-D Deficiency, Fractures)   Osteoporosis    On fosamax. This is followed by OBGYN      Mixed hyperlipidemia    Update FLP on RYR, zetia.  The 10-year ASCVD risk score Mikey Bussing DC Jr., et al., 2013) is: 15.4%   Values used to calculate the score:     Age: 49 years     Sex: Female     Is Non-Hispanic African American: No     Diabetic: No     Tobacco smoker: No     Systolic Blood Pressure:  120 mmHg     Is BP treated: Yes     HDL Cholesterol: 45.3 mg/dL     Total Cholesterol: 197 mg/dL       Relevant Orders   Lipid panel   Medicare annual wellness visit, subsequent - Primary    I have personally reviewed the Medicare Annual Wellness questionnaire and have noted 1. The patient's medical and social history 2. Their use of alcohol, tobacco or illicit drugs 3. Their current medications and supplements 4. The patient's functional ability including ADL's, fall risks, home safety risks and hearing or visual impairment. Cognitive function has been assessed and addressed as indicated.  5. Diet and physical activity 6. Evidence for depression or mood disorders The patients weight,  height, BMI have been recorded in the chart. I have made referrals, counseling and provided education to the patient based on review of the above and I have provided the pt with a written personalized care plan for preventive services. Provider list updated.. See scanned questionairre as needed for further documentation. Reviewed preventative protocols and updated unless pt declined.       MDD (major depressive disorder), recurrent episode, moderate ([place])    See above. Declines medication for now. Some situational depression.       ICD (implantable cardioverter-defibrillator) in place   Fibromyalgia   CKD (chronic kidney disease) stage 3, GFR 30-59 ml/min (HCC)    Update kidney function today.       Relevant Orders   Renal function panel   Chronic systolic CHF (congestive heart failure) (HCC)    Acute worsening, possibly after bronchitis. Now with BiV ICD followed by EP. Regularly seeing cardiology as well. Appreciate their care.       Cardiomyopathy, dilated, nonischemic (HCC)   Anxiety state    Reviewed situational anxiety due to recent and chronic illnesses and pandemic. Continue lorazepam 1/2 tab nightly for now (this has replaced xanax). Declines daily antidepressant at this time.       Advanced care planning/counseling discussion    Advanced directive discussion - has at home. Husband is HCPOA. Asked to bring [place] a copy.        Other Visit Diagnoses    Prediabetes       Relevant Orders   Hemoglobin A1c       No orders of the defined types were placed in this encounter.  Orders Placed This Encounter  Procedures   Lipid panel   Renal function panel   Hemoglobin A1c   VITAMIN D 25 Hydroxy (Vit-D Deficiency, Fractures)    Follow up plan: Return in about 3 months (around 02/27/2019) for follow up visit.  [name], MD

## 2018-11-27 NOTE — Assessment & Plan Note (Signed)
Update kidney function today.

## 2018-11-27 NOTE — Assessment & Plan Note (Deleted)
Update FLP on RYR, zetia.  The 10-year ASCVD risk score Mikey Bussing DC Brooke Bonito., et al., 2013) is: 15.4%   Values used to calculate the score:     Age: 73 years     Sex: Female     Is Non-Hispanic African American: No     Diabetic: No     Tobacco smoker: No     Systolic Blood Pressure: 817 mmHg     Is BP treated: Yes     HDL Cholesterol: 45.3 mg/dL     Total Cholesterol: 197 mg/dL

## 2018-11-27 NOTE — Assessment & Plan Note (Addendum)
On fosamax. This is followed by [facility]

## 2018-11-27 NOTE — Assessment & Plan Note (Addendum)
Acute worsening, possibly after bronchitis. Now with BiV ICD followed by EP. Regularly seeing cardiology as well. Appreciate their care.

## 2018-11-27 NOTE — Assessment & Plan Note (Signed)
Advanced directive discussion - has at home. Husband is HCPOA.Asked to bring us a copy.  

## 2018-11-27 NOTE — Assessment & Plan Note (Signed)

## 2018-11-27 NOTE — Assessment & Plan Note (Addendum)
Reviewed situational anxiety due to recent and chronic illnesses and pandemic. Continue lorazepam 1/2 tab nightly for now (this has replaced xanax). Declines daily antidepressant at this time.

## 2018-11-27 NOTE — Assessment & Plan Note (Signed)
Update FLP on RYR, zetia.  The 10-year ASCVD risk score Mikey Bussing DC Brooke Bonito., et al., 2013) is: 15.4%   Values used to calculate the score:     Age: 73 years     Sex: Female     Is Non-Hispanic African American: No     Diabetic: No     Tobacco smoker: No     Systolic Blood Pressure: 818 mmHg     Is BP treated: Yes     HDL Cholesterol: 45.3 mg/dL     Total Cholesterol: 197 mg/dL

## 2018-11-27 NOTE — Patient Instructions (Addendum)
Bring [place] copy of your advanced directive.  Send [place] copy of your next mammogram.  Check with Dr [name] about need for baby aspirin.  Return in 2 hours for labwork  Return in 3 months for follow up visit.   Health Maintenance After Age 73 After age 55, you are at a higher risk for certain long-term diseases and infections as well as injuries from falls. Falls are a major cause of broken bones and head injuries in people who are older than age 73. Getting regular preventive care can help to keep you healthy and well. Preventive care includes getting regular testing and making lifestyle changes as recommended by your health care provider. Talk with your health care provider about:  Which screenings and tests you should have. A screening is a test that checks for a disease when you have no symptoms.  A diet and exercise plan that is right for you. What should I know about screenings and tests to prevent falls? Screening and testing are the best ways to find a health problem early. Early diagnosis and treatment give you the best chance of managing medical conditions that are common after age 53. Certain conditions and lifestyle choices may make you more likely to have a fall. Your health care provider may recommend:  Regular vision checks. Poor vision and conditions such as cataracts can make you more likely to have a fall. If you wear glasses, make sure to get your prescription updated if your vision changes.  Medicine review. Work with your health care provider to regularly review all of the medicines you are taking, including over-the-counter medicines. Ask your health care provider about any side effects that may make you more likely to have a fall. Tell your health care provider if any medicines that you take make you feel dizzy or sleepy.  Osteoporosis screening. Osteoporosis is a condition that causes the bones to get weaker. This can make the bones weak and cause them to break more  easily.  Blood pressure screening. Blood pressure changes and medicines to control blood pressure can make you feel dizzy.  Strength and balance checks. Your health care provider may recommend certain tests to check your strength and balance while standing, walking, or changing positions.  Foot health exam. Foot pain and numbness, as well as not wearing proper footwear, can make you more likely to have a fall.  Depression screening. You may be more likely to have a fall if you have a fear of falling, feel emotionally low, or feel unable to do activities that you used to do.  Alcohol use screening. Using too much alcohol can affect your balance and may make you more likely to have a fall. What actions can I take to lower my risk of falls? General instructions  Talk with your health care provider about your risks for falling. Tell your health care provider if: ? You fall. Be sure to tell your health care provider about all falls, even ones that seem minor. ? You feel dizzy, sleepy, or off-balance.  Take over-the-counter and prescription medicines only as told by your health care provider. These include any supplements.  Eat a healthy diet and maintain a healthy weight. A healthy diet includes low-fat dairy products, low-fat (lean) meats, and fiber from whole grains, beans, and lots of fruits and vegetables. Home safety  Remove any tripping hazards, such as rugs, cords, and clutter.  Install safety equipment such as grab bars in bathrooms and safety rails on stairs.  Keep rooms and walkways well-lit. Activity   Follow a regular exercise program to stay fit. This will help you maintain your balance. Ask your health care provider what types of exercise are appropriate for you.  If you need a cane or walker, use it as recommended by your health care provider.  Wear supportive shoes that have nonskid soles. Lifestyle  Do not drink alcohol if your health care provider tells you not to  drink.  If you drink alcohol, limit how much you have: ? 0-1 drink a day for women. ? 0-2 drinks a day for men.  Be aware of how much alcohol is in your drink. In the [place] one drink equals one typical bottle of beer (12 oz), one-half glass of wine (5 oz), or one shot of hard liquor (1 oz).  Do not use any products that contain nicotine or tobacco, such as cigarettes and e-cigarettes. If you need help quitting, ask your health care provider. Summary  Having a healthy lifestyle and getting preventive care can help to protect your health and wellness after age 73.  Screening and testing are the best way to find a health problem early and help you avoid having a fall. Early diagnosis and treatment give you the best chance for managing medical conditions that are more common for people who are older than age 40.  Falls are a major cause of broken bones and head injuries in people who are older than age 24. Take precautions to prevent a fall at home.  Work with your health care provider to learn what changes you can make to improve your health and wellness and to prevent falls. This information is not intended to replace advice given to you by your health care provider. Make sure you discuss any questions you have with your health care provider. Document Released: 03/14/2017 Document Revised: 08/22/2018 Document Reviewed: 03/14/2017 Elsevier Patient Education  2020 [employer].

## 2018-11-28 LAB — LIPID PANEL
Cholesterol: 231 mg/dL — ABNORMAL HIGH (ref 0–200)
HDL: 45.2 mg/dL
NonHDL: 185.36
Total CHOL/HDL Ratio: 5
Triglycerides: 264 mg/dL — ABNORMAL HIGH (ref 0.0–149.0)
VLDL: 52.8 mg/dL — ABNORMAL HIGH (ref 0.0–40.0)

## 2018-11-28 LAB — LDL CHOLESTEROL, DIRECT: Direct LDL: 164 mg/dL

## 2018-11-28 LAB — RENAL FUNCTION PANEL
Albumin: 4.4 g/dL (ref 3.5–5.2)
BUN: 24 mg/dL — ABNORMAL HIGH (ref 6–23)
CO2: 25 meq/L (ref 19–32)
Calcium: 10.2 mg/dL (ref 8.4–10.5)
Chloride: 102 meq/L (ref 96–112)
Creatinine, Ser: 1.03 mg/dL (ref 0.40–1.20)
GFR: 52.47 mL/min — ABNORMAL LOW
Glucose, Bld: 77 mg/dL (ref 70–99)
Phosphorus: 4.5 mg/dL (ref 2.3–4.6)
Potassium: 4.4 meq/L (ref 3.5–5.1)
Sodium: 139 meq/L (ref 135–145)

## 2018-11-28 LAB — HEMOGLOBIN A1C: Hgb A1c MFr Bld: 5.9 % (ref 4.6–6.5)

## 2018-11-28 LAB — VITAMIN D 25 HYDROXY (VIT D DEFICIENCY, FRACTURES): VITD: 30.68 ng/mL (ref 30.00–100.00)

## 2018-12-02 NOTE — Progress Notes (Signed)
Remote ICD transmission.   

## 2018-12-09 ENCOUNTER — Telehealth: Payer: Self-pay | Admitting: Internal Medicine

## 2018-12-09 NOTE — Telephone Encounter (Signed)
Called patient for COVID-19 pre-screening for in office visit. ° °Have you recently traveled any where out of the local area in the last 2 weeks? No ° °Have you been in close contact with a person diagnosed with COVID-19 or someone awaiting results within the last 2 weeks? No ° °Do you currently have any of the following symptoms? If so, when did they start? °Cough     Diarrhea   Joint Pain °Fever      Muscle Pain   Red eyes °Shortness of breath   Abdominal pain  Vomiting °Loss of smell    Rash    Sore Throat °Headache    Weakness   Bruising or bleeding ° ° °Okay to proceed with visit 12/10/2018  ° ° °

## 2018-12-10 ENCOUNTER — Ambulatory Visit
Admission: RE | Admit: 2018-12-10 | Discharge: 2018-12-10 | Disposition: A | Discharge status: Home / Self Care | Payer: Medicare Other | Source: Ambulatory Visit | Attending: Internal Medicine | Admitting: Internal Medicine

## 2018-12-10 ENCOUNTER — Encounter: Payer: Self-pay | Admitting: Internal Medicine

## 2018-12-10 ENCOUNTER — Ambulatory Visit (INDEPENDENT_AMBULATORY_CARE_PROVIDER_SITE_OTHER): Payer: Medicare Other | Admitting: Internal Medicine

## 2018-12-10 ENCOUNTER — Other Ambulatory Visit: Payer: Self-pay

## 2018-12-10 VITALS — BP 120/62 | HR 92 | Temp 97.2°F | Ht 64.5 in | Wt 176.2 lb

## 2018-12-10 DIAGNOSIS — R0609 Other forms of dyspnea: Secondary | ICD-10-CM | POA: Insufficient documentation

## 2018-12-10 DIAGNOSIS — R0600 Dyspnea, unspecified: Secondary | ICD-10-CM

## 2018-12-10 DIAGNOSIS — J418 Mixed simple and mucopurulent chronic bronchitis: Secondary | ICD-10-CM | POA: Diagnosis not present

## 2018-12-10 MED ORDER — QVAR REDIHALER 80 MCG/ACT IN AERB: 2.0000 | INHALATION_SPRAY | Freq: Two times a day (BID) | RESPIRATORY_TRACT | 0 refills | Status: DC

## 2018-12-10 NOTE — Progress Notes (Signed)
[facility] Consultation      Assessment and Plan:  Chronic bronchitis. -Chronic production of green sputum, this appears to be consistent with chronic bronchitis. -Patient has numerous medication allergies, is very nervous about any side effects of any medications. --recommend that she use mucinex as needed - She has developed palpitations with Advair in the past, has been intolerant to Spiriva due to cough.  Dyspnea - Discussed the side effects of inhaled steroids, including decreased bone density, thrush, infection.  I prescribed Qvar, asked her to start 1 puff twice daily and then increase to 2 puffs twice daily if tolerated after 1 week.  She is reminded to rinse mouth after use.  She can continue leave albuterol to be used as needed.  Emphysema. --She has obvious hyperinflation on CXR which seems consistent with COPD/Emphysema.   Daytime sleepiness.  Insomnia. -Sleep study negative. --Mild insomnia, discussed restarting Lorrin Mais however she has a red dye allergy, therefore will hold off.   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 -May contribute to daytime fatigue. --EF=15%   Meds ordered this encounter  Medications  . beclomethasone (QVAR REDIHALER) 80 MCG/ACT inhaler    Sig: Inhale 2 puffs into the lungs 2 (two) times daily. Rinse mouth after use.    Dispense:  1 g    Refill:  0   Orders Placed This Encounter  Procedures  . DG Chest 2 View   Greater than 50% of the 40 minute visit was spent in counseling/coordination of care regarding treatment of chronic bronchitis and side effects of medications.    Date: 12/10/2018  MRN# [id number] Ashley [name] 03-29-1946    Ashley [name] is a 73 y.o. old female seen in consultation for chief complaint of:    Chief Complaint  Patient presents with  . Follow-up    has recently had a heart procedure, her pacemaker wasn't working correctly, now has new wires in,  wakes up most nights gasping, negative for wheezing, has had chest pains and feels heaviness on chest, productive light green solid phlem about 5 days a week     HPI:  Ashley [name] is a 73 y.o. female with a history of severe cardiomyopathy, nonischemic.  She has chronic bronchitis with frequent flares. She stopped spiriva due to cough. She was sent for a sleep study which was negative for sleep apnea with an AHI of 2.1, and showed frequent arousals, poor sleep efficiency.  At last visit she was having an episode of acute bronchitis, she has refused to use any inhalers that she feels that they would have negative side effects for her.  She has numerous allergies, detailed below.  She was therefore asked to use Mucinex. She has tried advair but it made her jittery.   She notes that since her last visit she has been coughing more, she has chest heaviness. She is using levalbuterol 2 puffs about twice per day and feels that it helps. She also takes cough syrup or tessalon for coughing.  She is using nasonex twice daily for rhinitis and is helping.  She now thinks that she would like to use an inhaler but is very nervous about her multiple medication allergies and wants to do something at a very low dose.  She has never been a smoker but lived with a smokers when she was young.   **Cardiac catheterization 10/16/2018>> EF 20%, normal coronary arteries. **Echocardiogram 10/14/2018>> EF 50%.  Severe mitral valve regurgitation **Echo 08/07/16; EF=40% **CBC  10/27/16; Eos ranges from 100-300.  **Sleep study 12/05/2016; negative for sleep apnea, snoring with insomnia with poor sleep efficiency and frequent arousals. **CXR films 11/14/16; mild hyperinflation c/w mild emphysema.   Medication:    Current Outpatient Medications:  .  acetaminophen (TYLENOL) 500 MG tablet, Take 500 mg by mouth daily as needed for moderate pain or headache (PAIN). , Disp: , Rfl:  .  albuterol (PROVENTIL HFA;VENTOLIN HFA) 108 (90  Base) MCG/ACT inhaler, TAKE 2 PUFFS BY MOUTH EVERY 6 HOURS AS NEEDED FOR WHEEZE OR SHORTNESS OF BREATH (Patient taking differently: Inhale 2 puffs into the lungs every 6 (six) hours as needed for wheezing or shortness of breath. ), Disp: 18 Inhaler, Rfl: 1 .  AMBULATORY NON FORMULARY MEDICATION, Medication Name:  1 squatty potty to use as needed, Disp: 1 each, Rfl: 0 .  Ascorbic Acid (VITAMIN C) 1000 MG tablet, Take 1,000 mg by mouth See admin instructions. Take 1000 mg daily, may take an additional 1000 mg as needed for cold symptoms, Disp: , Rfl:  .  aspirin EC 81 MG tablet, Take 81 mg by mouth at bedtime. , Disp: , Rfl:  .  benzonatate (TESSALON) 200 MG capsule, Take 1 capsule (200 mg total) by mouth 3 (three) times daily as needed for cough., Disp: 20 capsule, Rfl: 0 .  BIOTIN PO, Take 1 tablet by mouth daily., Disp: , Rfl:  .  carvedilol (COREG) 3.125 MG tablet, Take 1 tablet (3.125 mg total) by mouth 2 (two) times daily with a meal for 30 days., Disp: 60 tablet, Rfl: 0 .  cholecalciferol (VITAMIN D) 1000 units tablet, Take 1,000 Units by mouth daily. , Disp: , Rfl:  .  Coenzyme Q10 (COQ-10) 100 MG CAPS, Take 100 mg by mouth daily., Disp: , Rfl:  .  dextromethorphan (DELSYM) 30 MG/5ML liquid, Take 15 mg by mouth daily as needed for cough. , Disp: , Rfl:  .  Diphenhyd-Hydrocort-Nystatin (FIRST-DUKES MOUTHWASH) SUSP, Swish and swallow 5 mLs every 6 (six) hours as needed (mouth blisters). , Disp: , Rfl:  .  ezetimibe (ZETIA) 10 MG tablet, TAKE 1 TABLET (10 MG TOTAL) BY MOUTH DAILY. (Patient taking differently: Take 10 mg by mouth at bedtime. ), Disp: 90 tablet, Rfl: 2 .  furosemide (LASIX) 20 MG tablet, Take 1-2 tablets (20-40 mg total) by mouth as needed (As needed for weight gain or shortness of breath). Take with potassium (Patient taking differently: Take 20 mg by mouth 2 (two) times daily as needed (As needed for weight gain or shortness of breath). Take with potassium), Disp: 90 tablet, Rfl: 3 .   GARLIC PO, Take 1 capsule by mouth daily., Disp: , Rfl:  .  guaiFENesin (MUCINEX) 600 MG 12 hr tablet, Take 600 mg by mouth daily., Disp: , Rfl:  .  levalbuterol (XOPENEX HFA) 45 MCG/ACT inhaler, Inhale 2 puffs into the lungs every 4 (four) hours as needed for wheezing or shortness of breath., Disp: 1 Inhaler, Rfl: 3 .  LORazepam (ATIVAN) 0.5 MG tablet, Take 0.5-1 tablets (0.25-0.5 mg total) by mouth at bedtime as needed for anxiety or sleep., Disp: 30 tablet, Rfl: 1 .  losartan (COZAAR) 25 MG tablet, Take 0.5 tablet (12.5 mg) by mouth once daily at bedtime if the top number on your blood pressure is >90., Disp: , Rfl:  .  miconazole (MICOTIN) 2 % powder, Apply 1 application topically as needed (yeast)., Disp: , Rfl:  .  nystatin cream (MYCOSTATIN), Apply 1 application topically 2 (two) times  daily. (Patient taking differently: Apply 1 application topically as needed (yeast). ), Disp: 60 g, Rfl: 1 .  Omega-3 Fatty Acids (FISH OIL PO), Take 1 capsule by mouth daily., Disp: , Rfl:  .  potassium chloride (K-DUR) 10 MEQ tablet, Take 10 mEq by mouth daily as needed (with laxis). , Disp: , Rfl:  .  pyridOXINE (VITAMIN B-6) 100 MG tablet, Take 100 mg by mouth daily., Disp: , Rfl:  .  Red Yeast Rice Extract (RED YEAST RICE PO), Take 1 capsule by mouth daily., Disp: , Rfl:  .  spironolactone (ALDACTONE) 25 MG tablet, Take 0.5 tablets (12.5 mg total) by mouth daily for 30 days., Disp: 15 tablet, Rfl: 0 .  vitamin E 400 UNIT capsule, Take 400 Units by mouth daily., Disp: , Rfl:    Allergies:  Antihistamines, chlorpheniramine-type; Codeine; Cyclobenzaprine; Cymbalta [duloxetine hcl]; Furosemide; Influenza vac split [flu virus vaccine]; Prevnar [pneumococcal 13-val conj vacc]; Saline; Sulfasalazine; Atorvastatin; Chlorhexidine; Dilaudid [hydromorphone hcl]; Latex; Morphine and related; Pravastatin; Prevacid [lansoprazole]; Red dye; Rosuvastatin; Simvastatin; Diflucan [fluconazole]; Onion; Other; Penicillins;  Sertraline; Tape; and Metaxalone  Review of Systems:  Constitutional: Feels well. Cardiovascular: Denies chest pain, exertional chest pain.  Pulmonary: Denies hemoptysis, pleuritic chest pain.   The remainder of systems were reviewed and were found to be negative other than what is documented in the HPI.    Physical Examination:   VS: BP 120/62 (BP Location: Right Arm, Cuff Size: Normal)   Pulse 92   Temp (!) 97.2 F (36.2 C) (Temporal)   Ht 5' 4.5" (1.638 m)   Wt 176 lb 3.2 oz (79.9 kg)   SpO2 93%   BMI 29.78 kg/m   General Appearance: No distress  Neuro:without focal findings, mental status, speech normal, alert and oriented HEENT: PERRLA, EOM intact Pulmonary: No wheezing, No rales  CardiovascularNormal S1,S2.  No m/r/g.  Abdomen: Benign, Soft, non-tender, No masses Renal:  No costovertebral tenderness  GU:  No performed at this time. Endoc: No evident thyromegaly, no signs of acromegaly or Cushing features Skin:   warm, no rashes, no ecchymosis  Extremities: normal, no cyanosis, clubbing.    LABORATORY PANEL:   CBC No results for input(s): WBC, HGB, HCT, PLT in the last 168 hours. ------------------------------------------------------------------------------------------------------------------  Chemistries  No results for input(s): NA, K, CL, CO2, GLUCOSE, BUN, CREATININE, CALCIUM, MG, AST, ALT, ALKPHOS, BILITOT in the last 168 hours.  Invalid input(s): GFRCGP ------------------------------------------------------------------------------------------------------------------  Cardiac Enzymes No results for input(s): TROPONINI in the last 168 hours. ------------------------------------------------------------  RADIOLOGY:  No results found.     Thank  you for the consultation and for allowing [facility] to assist in the care of your patient. Our recommendations are noted above.  Please contact us if we can be of further service.   Marda Stalker, M.D., F.C.C.P.  Board Certified in Internal Medicine, Pulmonary Medicine, [place], and Sleep Medicine.  [facility] Office Number: [phone]   12/10/2018

## 2018-12-10 NOTE — Patient Instructions (Addendum)
Will start steroid inhaler. Start 1 puff twice daily (half dose), if tolerated increase to 2 puffs twice daily. You will need to be on this for 2 weeks to see if it will help. Remember to rinse your mouth after use.  Will check chest x ray.

## 2018-12-16 ENCOUNTER — Ambulatory Visit (INDEPENDENT_AMBULATORY_CARE_PROVIDER_SITE_OTHER): Payer: Medicare Other | Admitting: Family Medicine

## 2018-12-16 ENCOUNTER — Telehealth: Payer: Self-pay

## 2018-12-16 ENCOUNTER — Encounter: Payer: Self-pay | Admitting: Family Medicine

## 2018-12-16 DIAGNOSIS — J418 Mixed simple and mucopurulent chronic bronchitis: Secondary | ICD-10-CM

## 2018-12-16 DIAGNOSIS — S0501XA Injury of conjunctiva and corneal abrasion without foreign body, right eye, initial encounter: Secondary | ICD-10-CM | POA: Diagnosis not present

## 2018-12-16 MED ORDER — AMOXICILLIN 500 MG PO CAPS: 1000.0000 mg | ORAL_CAPSULE | Freq: Two times a day (BID) | ORAL | 0 refills | Status: DC

## 2018-12-16 NOTE — Telephone Encounter (Signed)
Seen today. 

## 2018-12-16 NOTE — Telephone Encounter (Signed)
DR please advise on CXR results. Thanks

## 2018-12-16 NOTE — Progress Notes (Signed)
Virtual visit completed through [employer]. Due to national recommendations of social distancing due to COVID-19, a virtual visit is felt to be most appropriate for this patient at this time. Reviewed limitations of a virtual visit.   Patient location: home Provider location: [place] [facility], office If any vitals were documented, they were collected by patient at home unless specified below.    BP 130/67   Pulse 92   Temp (!) 97.3 F (36.3 C)   Ht 5' 4.5" (1.638 m)   Wt 174 lb (78.9 kg)   BMI 29.41 kg/m    CC: chest pain, cough Subjective:    Patient ID: Ashley [name], female    DOB: November 01, 1945, 73 y.o.   MRN: [id number]  HPI: Ashley [name] is a 73 y.o. female presenting on 12/16/2018 for Chest Pain (C/o left side rib pain.  Started about 5 days ago.  Thinks may have pneumonia. Cough is more frequent.  Also, c/o wheezing.  Using inhaler. )   5d h/o increase L chest soreness that started at L posterior back and traveled up into posterior ribs. More pain standing up and leaning forward. Increase in chronic cough noted as well - now deeper, not really productive of mucous. Mild wheezing. Increased dyspnea due to cough but no increased dyspnea at rest or with exertion. Has been using xopenex inhaler more frequently. Denies fevers/chills.   3-4 wks ago she did hit back and neck when tipped over ladder. Some ongoing pain from this, but separate from current pain description.  She has increased mucinex use to BID. She has been hitting inhaler more regularly. She has also been using delsym.  Was recently prescribed qvar by pulm - but could not tolerate this due to burning throat.    Poked herself in the eye yesterday - planning to see eye doctor later today to r/o corneal abrasion.   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 Catheterization with non-obstructive coronaries. Pacemaker was upgraded to [employer] ICD.   She did have xray  done last week by pulm which was stable.     Relevant past medical, surgical, family and social history reviewed and updated as indicated. Interim medical history since our last visit reviewed. Allergies and medications reviewed and updated. Outpatient Medications Prior to Visit  Medication Sig Dispense Refill  . acetaminophen (TYLENOL) 500 MG tablet Take 500 mg by mouth daily as needed for moderate pain or headache (PAIN).     Marland Kitchen albuterol (PROVENTIL HFA;VENTOLIN HFA) 108 (90 Base) MCG/ACT inhaler TAKE 2 PUFFS BY MOUTH EVERY 6 HOURS AS NEEDED FOR WHEEZE OR SHORTNESS OF BREATH (Patient taking differently: Inhale 2 puffs into the lungs every 6 (six) hours as needed for wheezing or shortness of breath. ) 18 Inhaler 1  . ALPRAZolam (XANAX) 0.25 MG tablet Take 0.25 mg by mouth at bedtime as needed for anxiety. Patient taking 1/2 tablet    . AMBULATORY NON FORMULARY MEDICATION Medication Name:  1 squatty potty to use as needed 1 each 0  . Ascorbic Acid (VITAMIN C) 1000 MG tablet Take 1,000 mg by mouth See admin instructions. Take 1000 mg daily, may take an additional 1000 mg as needed for cold symptoms    . aspirin EC 81 MG tablet Take 81 mg by mouth at bedtime.     . beclomethasone (QVAR REDIHALER) 80 MCG/ACT inhaler Inhale 2 puffs into the lungs 2 (two) times daily. Rinse mouth after use. 1 g 0  .  benzonatate (TESSALON) 200 MG capsule Take 1 capsule (200 mg total) by mouth 3 (three) times daily as needed for cough. 20 capsule 0  . BIOTIN PO Take 1 tablet by mouth daily.    . carvedilol (COREG) 3.125 MG tablet Take 1 tablet (3.125 mg total) by mouth 2 (two) times daily with a meal for 30 days. 60 tablet 0  . cholecalciferol (VITAMIN D) 1000 units tablet Take 1,000 Units by mouth daily.     . Coenzyme Q10 (COQ-10) 100 MG CAPS Take 100 mg by mouth daily.    Marland Kitchen dextromethorphan (DELSYM) 30 MG/5ML liquid Take 15 mg by mouth daily as needed for cough.     . Diphenhyd-Hydrocort-Nystatin (FIRST-DUKES  MOUTHWASH) SUSP Swish and swallow 5 mLs every 6 (six) hours as needed (mouth blisters).     . ezetimibe (ZETIA) 10 MG tablet TAKE 1 TABLET (10 MG TOTAL) BY MOUTH DAILY. (Patient taking differently: Take 10 mg by mouth at bedtime. ) 90 tablet 2  . furosemide (LASIX) 20 MG tablet Take 1-2 tablets (20-40 mg total) by mouth as needed (As needed for weight gain or shortness of breath). Take with potassium (Patient taking differently: Take 20 mg by mouth 2 (two) times daily as needed (As needed for weight gain or shortness of breath). Take with potassium) 90 tablet 3  . GARLIC PO Take 1 capsule by mouth daily.    Marland Kitchen guaiFENesin (MUCINEX) 600 MG 12 hr tablet Take 600 mg by mouth daily.    Marland Kitchen levalbuterol (XOPENEX HFA) 45 MCG/ACT inhaler Inhale 2 puffs into the lungs every 4 (four) hours as needed for wheezing or shortness of breath. 1 Inhaler 3  . LORazepam (ATIVAN) 0.5 MG tablet Take 0.5-1 tablets (0.25-0.5 mg total) by mouth at bedtime as needed for anxiety or sleep. 30 tablet 1  . losartan (COZAAR) 25 MG tablet Take 0.5 tablet (12.5 mg) by mouth once daily at bedtime if the top number on your blood pressure is >90.    . miconazole (MICOTIN) 2 % powder Apply 1 application topically as needed (yeast).    . nystatin cream (MYCOSTATIN) Apply 1 application topically 2 (two) times daily. (Patient taking differently: Apply 1 application topically as needed (yeast). ) 60 g 1  . Omega-3 Fatty Acids (FISH OIL PO) Take 1 capsule by mouth daily.    . potassium chloride (K-DUR) 10 MEQ tablet Take 10 mEq by mouth daily as needed (with laxis).     Marland Kitchen pyridOXINE (VITAMIN B-6) 100 MG tablet Take 100 mg by mouth daily.    . Red Yeast Rice Extract (RED YEAST RICE PO) Take 1 capsule by mouth daily.    Marland Kitchen spironolactone (ALDACTONE) 25 MG tablet Take 0.5 tablets (12.5 mg total) by mouth daily for 30 days. 15 tablet 0  . vitamin E 400 UNIT capsule Take 400 Units by mouth daily.     No facility-administered medications prior to  visit.      Per HPI unless specifically indicated in ROS section below Review of Systems Objective:    BP 130/67   Pulse 92   Temp (!) 97.3 F (36.3 C)   Ht 5' 4.5" (1.638 m)   Wt 174 lb (78.9 kg)   BMI 29.41 kg/m   Wt Readings from Last 3 Encounters:  12/16/18 174 lb (78.9 kg)  12/10/18 176 lb 3.2 oz (79.9 kg)  11/27/18 175 lb 1 oz (79.4 kg)     Physical exam: Gen: alert, NAD, not ill appearing Pulm: speaks  in complete sentences without increased work of breathing Psych: normal mood, normal thought content      Results for orders placed or performed in visit on 11/27/18  Lipid panel  Result Value Ref Range   Cholesterol 231 (H) 0 - 200 mg/dL   Triglycerides 264.0 (H) 0.0 - 149.0 mg/dL   HDL 45.20 >39.00 mg/dL   VLDL 52.8 (H) 0.0 - 40.0 mg/dL   Total CHOL/HDL Ratio 5    NonHDL 185.36   Renal function panel  Result Value Ref Range   Sodium 139 135 - 145 mEq/L   Potassium 4.4 3.5 - 5.1 mEq/L   Chloride 102 96 - 112 mEq/L   CO2 25 19 - 32 mEq/L   Calcium 10.2 8.4 - 10.5 mg/dL   Albumin 4.4 3.5 - 5.2 g/dL   BUN 24 (H) 6 - 23 mg/dL   Creatinine, Ser 1.03 0.40 - 1.20 mg/dL   Glucose, Bld 77 70 - 99 mg/dL   Phosphorus 4.5 2.3 - 4.6 mg/dL   GFR 52.47 (L) >60.00 mL/min  Hemoglobin A1c  Result Value Ref Range   Hgb A1c MFr Bld 5.9 4.6 - 6.5 %  VITAMIN D 25 Hydroxy (Vit-D Deficiency, Fractures)  Result Value Ref Range   VITD 30.68 30.00 - 100.00 ng/mL  LDL cholesterol, direct  Result Value Ref Range   Direct LDL 164.0 mg/dL   *Note: Due to a large number of results and/or encounters for the requested time period, some results have not been displayed. A complete set of results can be found in Results Review.   Assessment & Plan:   Problem List Items Addressed This Visit    Chronic bronchitis (HCC)    Anticipate recurrent bronchitis in h/o chronic bronchitis. Don't seen significant need for prednisone taper but will cover with amoxicillin 7d course. Update if not  improving with treatment.           Meds ordered this encounter  Medications  . amoxicillin (AMOXIL) 500 MG capsule    Sig: Take 2 capsules (1,000 mg total) by mouth 2 (two) times daily.    Dispense:  28 capsule    Refill:  0    Ok to do despite PCN allergy listed - tolerates amox ok   No orders of the defined types were placed in this encounter.   I discussed the assessment and treatment plan with the patient. The patient was provided an opportunity to ask questions and all were answered. The patient agreed with the plan and demonstrated an understanding of the instructions. The patient was advised to call back or seek an in-person evaluation if the symptoms worsen or if the condition fails to improve as anticipated.  Follow up plan: Return if symptoms worsen or fail to improve.  [name], MD

## 2018-12-16 NOTE — Assessment & Plan Note (Signed)
Anticipate recurrent bronchitis in h/o chronic bronchitis. Don't seen significant need for prednisone taper but will cover with amoxicillin 7d course. Update if not improving with treatment.

## 2018-12-16 NOTE — Telephone Encounter (Signed)
For 3-4 days pt started with lt side pain at lower ribs and pain is in back also; pt concerned may have pneumonia and 11/04/18 pt had up grade of pacemaker and defib.pt has prod cough with light green phlegm. Some wheezing on and off but inhaler helps that. S/T comes and goes, chills on and off for years per pt. SOB with COPD and cough.no other covid symptoms, no travel and no known exposure to covid. Pt awaiting appt from [facility] due to poking herself in lt eye with a stick from a flower over weekend. Dr Darnell Level said can offer virtual visit or UC. Pt scheduled virtual visit today at 12:15. ED precautions give. FYI to Dr Darnell Level.

## 2018-12-17 DIAGNOSIS — S0501XA Injury of conjunctiva and corneal abrasion without foreign body, right eye, initial encounter: Secondary | ICD-10-CM | POA: Diagnosis not present

## 2019-01-15 ENCOUNTER — Other Ambulatory Visit: Payer: Self-pay | Admitting: Otolaryngology

## 2019-01-15 ENCOUNTER — Other Ambulatory Visit: Payer: Self-pay

## 2019-01-15 ENCOUNTER — Ambulatory Visit
Admission: RE | Admit: 2019-01-15 | Discharge: 2019-01-15 | Disposition: A | Discharge status: Home / Self Care | Payer: Medicare Other | Source: Ambulatory Visit | Attending: Otolaryngology | Admitting: Otolaryngology

## 2019-01-15 ENCOUNTER — Ambulatory Visit
Admission: RE | Admit: 2019-01-15 | Discharge: 2019-01-15 | Disposition: A | Discharge status: Home / Self Care | Payer: Medicare Other | Attending: Otolaryngology | Admitting: Otolaryngology

## 2019-01-15 DIAGNOSIS — M47812 Spondylosis without myelopathy or radiculopathy, cervical region: Secondary | ICD-10-CM | POA: Diagnosis not present

## 2019-01-15 DIAGNOSIS — M542 Cervicalgia: Secondary | ICD-10-CM | POA: Diagnosis not present

## 2019-01-15 DIAGNOSIS — I509 Heart failure, unspecified: Secondary | ICD-10-CM | POA: Diagnosis not present

## 2019-01-17 DIAGNOSIS — M4692 Unspecified inflammatory spondylopathy, cervical region: Secondary | ICD-10-CM | POA: Diagnosis not present

## 2019-01-22 ENCOUNTER — Telehealth: Payer: Self-pay | Admitting: Internal Medicine

## 2019-01-22 NOTE — Telephone Encounter (Signed)
Remote transmission received and device function WNL, 2 episodes of ST with max Hr in 140's noted. Patient given report results and will f/u with cardiologist.

## 2019-01-22 NOTE — Telephone Encounter (Signed)
Patient reports she  feels her EF is decreased because she gets tired with her daily activities. C/O heaviness "deep in her heart" , reports this is same feeling she had with decreased EF.Denies CP. C/o SOB and decreased tolerance for activity x 2 weeks. C/o cough that is relieved after she uses her inhaler. C/o 4-5 lb increased weight gain over night intermittently over past month. Recommended patient send remote transmission for reviewand contact her cardiologist concerning  s/sx of CHF.

## 2019-01-22 NOTE — Telephone Encounter (Signed)
New Message:     Pt wants to know if she can send a transmission, she feels like her heart is not pumping right. She feels like her Ejection Fraction is down.b

## 2019-01-23 ENCOUNTER — Telehealth: Payer: Self-pay | Admitting: Cardiovascular Disease

## 2019-01-23 DIAGNOSIS — Z6829 Body mass index (BMI) 29.0-29.9, adult: Secondary | ICD-10-CM | POA: Diagnosis not present

## 2019-01-23 DIAGNOSIS — M4302 Spondylolysis, cervical region: Secondary | ICD-10-CM | POA: Diagnosis not present

## 2019-01-23 DIAGNOSIS — I5022 Chronic systolic (congestive) heart failure: Secondary | ICD-10-CM

## 2019-01-23 DIAGNOSIS — M47812 Spondylosis without myelopathy or radiculopathy, cervical region: Secondary | ICD-10-CM | POA: Diagnosis not present

## 2019-01-23 NOTE — Telephone Encounter (Signed)
I reviewed the patient's chart. She saw Dr. [name] on 11/12/18- at that time she reported she was taking lasix 10 mg BID with 1/2 dose potassium BID.  I called the patient today to follow up on how she was currently taking her lasix due to a refill request, but also noticed in her chart that she had called the device clinic yesterday asking to transmit for her device due to fatigue and feeling like her EF was down.  Per Device Clinic notes, device function WNL with 2 episodes of ST w/ max HR in the 140's.  She was told to contact her primary cardiologist.  In speaking with the patient today, she is reporting "i'm just not doing well." She has complaints of  - increased abdominal fullness - weight trending up 4-6 lbs in a day (AM weights typically 172-174 lbs & PM weights 177 lbs)/ she states her d/c weight in June was 166 lbs - neck pain (she is seeing orthopedics today) - strange feeling at the back of her throat (she recently wore a neck brace and is unsure if she reacted to that) - cough - some nausea  The patient confirms she cannot take high doses of lasix due to hypotension/ hyponatremia that she experienced on higher dose lasix. - she takes lasix 20 mg- 1/2 tablet daily or 1/2 tablet BID depending on her weight.  She feels she is just not doing well. BP recently at little higher than her normal per her report at 120/62- typically she is [id number] systolic.  I advised this could be due to her pain.   I have advised the patient she may benefit from taking lasix 20 mg QD x 3 days, then back to her normal dosing, but will confirm with Dr. [name]. She is also requesting her echo be repeated. She is s/p CRT upgrade on 6/22. She is due to see Dr. Lovena Le on 9/24.  I advised she may be ok for a repeat echo since she is 3 months s/p upgrade of her device. Again will review the above with Dr. [name]. She is aware we will call back with further recommendations.   The patient voices  understanding and is agreeable.

## 2019-01-23 NOTE — Telephone Encounter (Signed)
I called and spoke with the patient. I advised her of Dr. Donivan Scull recommendations that she will most likely need lasix 20 mg BID to try to pull the extra fluid off of her.  She is agreeable with trying this. She took lasix 10 mg this morning. I have advised her to go ahead and take another 20 mg of lasix now. She has also been advised to take lasix 20 mg BID x the next 3 days (Friday/ Saturday/ Sunday) and we will call her back on Monday to see how she has responded to the increased dose of lasix.   She is aware I will go ahead and order the echo for her, but Dr. [name] would like her fluid to be down some prior.  The patient voices understanding of the above and is agreeable.

## 2019-01-23 NOTE — Telephone Encounter (Signed)
Please review for a refill on Furosemide 20 mg. The last office note from Dr. [name] states, Take 1-2 tablets (20-40 mg total) by mouth as needed (As needed for weight gain or shortness of breath). Take with potassium. Also says take Furosemide 20 mg one tablet twice a day as needed for shortness of breath. Please review and send in a refill to [facility].

## 2019-01-23 NOTE — Telephone Encounter (Signed)
If she was taking Lasix 10 twice daily, Now with fluid retention weight gain She needs aggressive restriction of her fluids, no eating out She will need at least Lasix 20 twice daily if not more 20 daily would likely not be any different from what she is taking now Would try to get weight down before echo (limited study to look at ejection fraction)

## 2019-01-27 DIAGNOSIS — M4692 Unspecified inflammatory spondylopathy, cervical region: Secondary | ICD-10-CM | POA: Diagnosis not present

## 2019-01-28 ENCOUNTER — Telehealth: Payer: Self-pay | Admitting: Internal Medicine

## 2019-01-28 NOTE — Telephone Encounter (Signed)
Spoke with the patient. Patient sts that she has had minor  improvement in symptoms with the increase of Lasix. She denies increased sob, orthopnea, PND. She complains of fatigue, and a feeling of fullness in her abdomen. Patient recent weights: [facility] 01/24/19 174lb Sat 01/25/19 173lb Sun 01/26/19 174lb Mon 01/27/19 172lb Her PM weights are up 3-6lbs in a day. Patient sts that her weight fluctuated between 172-174lb. She sts that her dry weight is closer to 165-167lbs. She denies increased sodium intake and she is adhering to her fluid restriction. Patient sts that her diet consist of lean meats and fresh fruits and vegetables.  Adv the patient that I will fwd th update to Dr. [name] and we will call back with his recommendation. Patient agreeable with plan.

## 2019-01-28 NOTE — Telephone Encounter (Signed)
New message  Patient states that she received a call from Dr. Tanna Furry office on yesterday. She is returning the call.

## 2019-01-28 NOTE — Telephone Encounter (Signed)
See 01/23/19 telephone encounter for documentation.

## 2019-01-29 NOTE — Telephone Encounter (Signed)
Would recommend we change her Lasix to Torsemide 20 mg twice daily for 3 days (morning and 2 PM)  then down to 20 mg daily until she achieves her dry weight 167 pounds

## 2019-01-29 NOTE — Telephone Encounter (Signed)
Attempted to call the patient. No answer- I left a message for the patient to call back.  

## 2019-01-30 MED ORDER — TORSEMIDE 20 MG PO TABS: 20.0000 mg | ORAL_TABLET | Freq: Every day | ORAL | 3 refills | Status: DC

## 2019-01-30 NOTE — Telephone Encounter (Signed)
Spoke with patient and reviewed providers recommendations to switch to torsemide. She verbalized understanding and states that she has some already at home. Reviewed instructions to do 3 days of twice a day and then go to once daily until she reaches dry weight of 167 pounds. She verbalized understanding of all instructions, was agreeable with plan, and had no further questions at this time.

## 2019-01-30 NOTE — Telephone Encounter (Signed)
Spoke with patient today and no further needs.

## 2019-02-03 ENCOUNTER — Telehealth: Payer: Self-pay | Admitting: Family Medicine

## 2019-02-03 NOTE — Telephone Encounter (Signed)
Patient called stating she was having a sore throat/ very tired/neck pain/ cold sores/ thrush and just not feel well. She stated she has been tested for covid 3 times and every test has come back negative.  Patient is not sure what she should do.  I did not see any 30 min slots for a virtual visit with you soon.  And she does not want to see another provider since she stated you know her well.   Patient stated she has been to an ENT.

## 2019-02-03 NOTE — Telephone Encounter (Signed)
May place at 11am tomorrow and make that a 30 min appt.  Can we also try and get ENT records? Thanks.

## 2019-02-03 NOTE — Telephone Encounter (Signed)
Spoke with pt asking her to sign a medical release with [place] or ENT allowing [place] to get the records for ENT visit.  Pt verbalizes understanding.

## 2019-02-04 ENCOUNTER — Ambulatory Visit (INDEPENDENT_AMBULATORY_CARE_PROVIDER_SITE_OTHER): Payer: Medicare Other | Admitting: Family Medicine

## 2019-02-04 ENCOUNTER — Encounter: Payer: Self-pay | Admitting: Family Medicine

## 2019-02-04 VITALS — BP 111/59 | HR 94 | Ht 64.5 in | Wt 174.0 lb

## 2019-02-04 DIAGNOSIS — R5382 Chronic fatigue, unspecified: Secondary | ICD-10-CM

## 2019-02-04 DIAGNOSIS — R5381 Other malaise: Secondary | ICD-10-CM | POA: Diagnosis not present

## 2019-02-04 DIAGNOSIS — I5043 Acute on chronic combined systolic (congestive) and diastolic (congestive) heart failure: Secondary | ICD-10-CM

## 2019-02-04 DIAGNOSIS — J029 Acute pharyngitis, unspecified: Secondary | ICD-10-CM

## 2019-02-04 DIAGNOSIS — J418 Mixed simple and mucopurulent chronic bronchitis: Secondary | ICD-10-CM | POA: Diagnosis not present

## 2019-02-04 DIAGNOSIS — M542 Cervicalgia: Secondary | ICD-10-CM | POA: Insufficient documentation

## 2019-02-04 MED ORDER — FIRST-DUKES MOUTHWASH MT SUSP: 5.0000 mL | Freq: Four times a day (QID) | OROMUCOSAL | 0 refills | Status: DC | PRN

## 2019-02-04 NOTE — Assessment & Plan Note (Signed)
Saw ENT, referred to ortho and neurosurgery. Ortho has recommended soft collar, treated with prednisone taper, and pt to start PT course today. Reviewed cervical films showing mild DDD changes.

## 2019-02-04 NOTE — Assessment & Plan Note (Signed)
Has seen pulm, placed on Qvar which she did not tolerate (may have cause thrush). Now only taking xopenex HFA PRN. She will call to schedule office visit f/u with pulm.

## 2019-02-04 NOTE — Assessment & Plan Note (Signed)
Managed by cardiology, now on torsemide. Weight today elevated (goal dry weight 167lbs).

## 2019-02-04 NOTE — Assessment & Plan Note (Signed)
Chronic issue without current evidence of thrush. [facility] mouthwash refilled, reviewed active ingredients with patient. S/p reassuring ENT eval earlier this month.

## 2019-02-04 NOTE — Progress Notes (Signed)
Virtual visit completed through [employer]. Due to national recommendations of social distancing due to COVID-19, a virtual visit is felt to be most appropriate for this patient at this time. Reviewed limitations of a virtual visit.   Patient location: home Provider location: [place] at [facility], office If any vitals were documented, they were collected by patient at home unless specified below.    BP (!) 111/59   Pulse 94   Ht 5' 4.5" (1.638 m)   Wt 174 lb (78.9 kg)   BMI 29.41 kg/m    CC: ST, neck pain Subjective:    Patient ID: Ashley [name], female    DOB: [birth year], 73 y.o.   MRN: [id number]  HPI: Ashley [name] is a 73 y.o. female presenting on 02/04/2019 for Sore Throat (C/o sore throat, neck pain, cold sores and fatigue.  Has been seen by ENT. )   Saw ENT earlier this month (Dr Kathyrn Sheriff), note reviewed. Normal ear and throat exam, neck pain attributed to cervical DDD. Referred to [facility] Dr Sabra Heck, as well as Dr Trenton Gammon neurosurgery. Treated with soft neck brace to wear 3 hours a day as well as short prednisone taper (already completed). Referred for physical therapy - to start today. Planned f/u next month with ortho and neurosurgery.   Bad day yesterday - fatigued, no energy. Some fleeting tingling chest discomfort. Chronic mild stable dyspnea, unchanged. Worse with exertion.  Had nasal congestion, throat "got stopped up", sore throat.  Some cold sores that came up 5-6 d ago. She has been treating with cold sore medication, camax vaseline.  Throat staying mildly sore but this is chronic for her. Describes ST since reaction to pneumonia vaccine years ago.   Treated with cold compress and inhaler, tylenol with benefit. She has been using [facility] mouthwash as well.  Not using Qvar due to prior thrush and cold sores with its use. Previously intolerant to advair and spiriva.   Known chronic sCHF with goal dry weight 167lbs. Recently changed from lasix to torsemide.       Relevant past medical, surgical, family and social history reviewed and updated as indicated. Interim medical history since our last visit reviewed. Allergies and medications reviewed and updated. Outpatient Medications Prior to Visit  Medication Sig Dispense Refill  . acetaminophen (TYLENOL) 500 MG tablet Take 500 mg by mouth daily as needed for moderate pain or headache (PAIN).     Marland Kitchen ALPRAZolam (XANAX) 0.25 MG tablet Take 0.25 mg by mouth at bedtime as needed for anxiety. Patient taking 1/2 tablet    . AMBULATORY NON FORMULARY MEDICATION Medication Name:  1 squatty potty to use as needed 1 each 0  . Ascorbic Acid (VITAMIN C) 1000 MG tablet Take 1,000 mg by mouth See admin instructions. Take 1000 mg daily, may take an additional 1000 mg as needed for cold symptoms    . aspirin EC 81 MG tablet Take 81 mg by mouth at bedtime.     . beclomethasone (QVAR REDIHALER) 80 MCG/ACT inhaler Inhale 2 puffs into the lungs 2 (two) times daily. Rinse mouth after use. 1 g 0  . benzonatate (TESSALON) 200 MG capsule Take 1 capsule (200 mg total) by mouth 3 (three) times daily as needed for cough. 20 capsule 0  . BIOTIN PO Take 1 tablet by mouth daily.    . cholecalciferol (VITAMIN D) 1000 units tablet Take 1,000 Units by mouth daily.     . Coenzyme Q10 (COQ-10) 100 MG CAPS Take 100  mg by mouth daily.    Marland Kitchen dextromethorphan (DELSYM) 30 MG/5ML liquid Take 15 mg by mouth daily as needed for cough.     . ezetimibe (ZETIA) 10 MG tablet TAKE 1 TABLET (10 MG TOTAL) BY MOUTH DAILY. (Patient taking differently: Take 10 mg by mouth at bedtime. ) 90 tablet 2  . GARLIC PO Take 1 capsule by mouth daily.    Marland Kitchen guaiFENesin (MUCINEX) 600 MG 12 hr tablet Take 600 mg by mouth daily.    Marland Kitchen levalbuterol (XOPENEX HFA) 45 MCG/ACT inhaler Inhale 2 puffs into the lungs every 4 (four) hours as needed for wheezing or shortness of breath. 1 Inhaler 3  . LORazepam (ATIVAN) 0.5 MG tablet Take 0.5-1 tablets (0.25-0.5 mg total) by mouth at  bedtime as needed for anxiety or sleep. 30 tablet 1  . losartan (COZAAR) 25 MG tablet Take 0.5 tablet (12.5 mg) by mouth once daily at bedtime if the top number on your blood pressure is >90.    . miconazole (MICOTIN) 2 % powder Apply 1 application topically as needed (yeast).    . nystatin cream (MYCOSTATIN) Apply 1 application topically 2 (two) times daily. (Patient taking differently: Apply 1 application topically as needed (yeast). ) 60 g 1  . Omega-3 Fatty Acids (FISH OIL PO) Take 1 capsule by mouth daily.    . potassium chloride (K-DUR) 10 MEQ tablet Take 10 mEq by mouth daily as needed (with laxis).     Marland Kitchen pyridOXINE (VITAMIN B-6) 100 MG tablet Take 100 mg by mouth daily.    . Red Yeast Rice Extract (RED YEAST RICE PO) Take 1 capsule by mouth daily.    Marland Kitchen torsemide (DEMADEX) 20 MG tablet Take 1 tablet (20 mg total) by mouth daily. Once daily until you achieve dry weight of 167 pounds. 180 tablet 3  . vitamin E 400 UNIT capsule Take 400 Units by mouth daily.    Marland Kitchen albuterol (PROVENTIL HFA;VENTOLIN HFA) 108 (90 Base) MCG/ACT inhaler TAKE 2 PUFFS BY MOUTH EVERY 6 HOURS AS NEEDED FOR WHEEZE OR SHORTNESS OF BREATH (Patient taking differently: Inhale 2 puffs into the lungs every 6 (six) hours as needed for wheezing or shortness of breath. ) 18 Inhaler 1  . amoxicillin (AMOXIL) 500 MG capsule Take 2 capsules (1,000 mg total) by mouth 2 (two) times daily. 28 capsule 0  . Diphenhyd-Hydrocort-Nystatin (FIRST-DUKES MOUTHWASH) SUSP Swish and swallow 5 mLs every 6 (six) hours as needed (mouth blisters).     . carvedilol (COREG) 3.125 MG tablet Take 1 tablet (3.125 mg total) by mouth 2 (two) times daily with a meal for 30 days. 60 tablet 0  . spironolactone (ALDACTONE) 25 MG tablet Take 0.5 tablets (12.5 mg total) by mouth daily for 30 days. 15 tablet 0   No facility-administered medications prior to visit.      Per HPI unless specifically indicated in ROS section below Review of Systems Objective:     BP (!) 111/59   Pulse 94   Ht 5' 4.5" (1.638 m)   Wt 174 lb (78.9 kg)   BMI 29.41 kg/m   Wt Readings from Last 3 Encounters:  02/04/19 174 lb (78.9 kg)  12/16/18 174 lb (78.9 kg)  12/10/18 176 lb 3.2 oz (79.9 kg)     Physical exam: Gen: alert, NAD, not ill appearing Pulm: speaks in complete sentences without increased work of breathing Psych: normal mood, normal thought content      Results for orders placed or performed in visit  on 11/27/18  Lipid panel  Result Value Ref Range   Cholesterol 231 (H) 0 - 200 mg/dL   Triglycerides 264.0 (H) 0.0 - 149.0 mg/dL   HDL 45.20 >39.00 mg/dL   VLDL 52.8 (H) 0.0 - 40.0 mg/dL   Total CHOL/HDL Ratio 5    NonHDL 185.36   Renal function panel  Result Value Ref Range   Sodium 139 135 - 145 mEq/L   Potassium 4.4 3.5 - 5.1 mEq/L   Chloride 102 96 - 112 mEq/L   CO2 25 19 - 32 mEq/L   Calcium 10.2 8.4 - 10.5 mg/dL   Albumin 4.4 3.5 - 5.2 g/dL   BUN 24 (H) 6 - 23 mg/dL   Creatinine, Ser 1.03 0.40 - 1.20 mg/dL   Glucose, Bld 77 70 - 99 mg/dL   Phosphorus 4.5 2.3 - 4.6 mg/dL   GFR 52.47 (L) >60.00 mL/min  Hemoglobin A1c  Result Value Ref Range   Hgb A1c MFr Bld 5.9 4.6 - 6.5 %  VITAMIN D 25 Hydroxy (Vit-D Deficiency, Fractures)  Result Value Ref Range   VITD 30.68 30.00 - 100.00 ng/mL  LDL cholesterol, direct  Result Value Ref Range   Direct LDL 164.0 mg/dL   *Note: Due to a large number of results and/or encounters for the requested time period, some results have not been displayed. A complete set of results can be found in Results Review.   Lab Results  Component Value Date   TSH 1.18 05/31/2018    Assessment & Plan:  Will check labwork at f/u visit next month.  Problem List Items Addressed This Visit    Sore throat - Primary    Chronic issue without current evidence of thrush. [facility] mouthwash refilled, reviewed active ingredients with patient. S/p reassuring ENT eval earlier this month.      Chronic fatigue and malaise    Chronic bronchitis (HCC)    Has seen pulm, placed on Qvar which she did not tolerate (may have cause thrush). Now only taking xopenex HFA PRN. She will call to schedule office visit f/u with pulm.       Cervical pain (neck)    Saw ENT, referred to ortho and neurosurgery. Ortho has recommended soft collar, treated with prednisone taper, and pt to start PT course today. Reviewed cervical films showing mild DDD changes.        Acute on chronic combined systolic and diastolic CHF (congestive heart failure) ([place])    Managed by cardiology, now on torsemide. Weight today elevated (goal dry weight 167lbs).          Meds ordered this encounter  Medications  . Diphenhyd-Hydrocort-Nystatin (FIRST-DUKES MOUTHWASH) SUSP    Sig: Swish and swallow 5 mLs every 6 (six) hours as needed (mouth blisters).    Dispense:  237 mL    Refill:  0   No orders of the defined types were placed in this encounter.   I discussed the assessment and treatment plan with the patient. The patient was provided an opportunity to ask questions and all were answered. The patient agreed with the plan and demonstrated an understanding of the instructions. The patient was advised to call back or seek an in-person evaluation if the symptoms worsen or if the condition fails to improve as anticipated.  Follow up plan: No follow-ups on file.  [name], MD

## 2019-02-05 DIAGNOSIS — M542 Cervicalgia: Secondary | ICD-10-CM | POA: Diagnosis not present

## 2019-02-06 ENCOUNTER — Ambulatory Visit (INDEPENDENT_AMBULATORY_CARE_PROVIDER_SITE_OTHER): Payer: Medicare Other | Admitting: Internal Medicine

## 2019-02-06 ENCOUNTER — Other Ambulatory Visit: Payer: Self-pay

## 2019-02-06 ENCOUNTER — Encounter: Payer: Self-pay | Admitting: Internal Medicine

## 2019-02-06 VITALS — BP 104/64 | HR 77 | Ht 64.5 in | Wt 176.0 lb

## 2019-02-06 DIAGNOSIS — I5022 Chronic systolic (congestive) heart failure: Secondary | ICD-10-CM

## 2019-02-06 DIAGNOSIS — I447 Left bundle-branch block, unspecified: Secondary | ICD-10-CM | POA: Diagnosis not present

## 2019-02-06 DIAGNOSIS — Z95810 Presence of automatic (implantable) cardiac defibrillator: Secondary | ICD-10-CM | POA: Diagnosis not present

## 2019-02-06 NOTE — Progress Notes (Signed)
HPI Ashley [name] returns today for ongoing evaluation and management after undergoing upgrade of her ICD to a biv device. She was found to have no acceptable LV veins and underwent insertion of a His bundle lead. In the interim, she has in her mind not felt any better. Her QRS with the His bundle lead today is 138 ms. Her LBBB QRS was 150 ms. She is biv pacing 99%.  Allergies  Allergen Reactions  . Antihistamines, Chlorpheniramine-Type Other (See Comments)    Alters vision  . Codeine Other (See Comments)    Confusion and dizziness  . Cyclobenzaprine Other (See Comments)    Adverse reaction - back and throat pain, dizziness, exhaustion  . Cymbalta [Duloxetine Hcl] Other (See Comments)    "body spasms and made me feel weird in my head"   . Furosemide     Severe hypotension   . Influenza Vac Split [Flu Virus Vaccine] Other (See Comments)    "allergic to concentrated eggs that are in vaccine"  . Prevnar [Pneumococcal 13-Val Conj Vacc] Other (See Comments)    Egg allergy Bad reaction after shot  . Saline Rash    FEVER, BURNING  . Sulfasalazine Other (See Comments)    "makes my whole body smell like sulfa & makes me sick just smelling it"  . Atorvastatin Other (See Comments)    Severe muscle cramps to generic atorvastatin.  Able to take brand lipitor.  . Chlorhexidine Other (See Comments)    blisters  . Dilaudid [Hydromorphone Hcl] Other (See Comments)    Feels like she is burning internally   . Latex Nausea Only, Rash and Other (See Comments)    Pain and infection  . Morphine And Related Other (See Comments)    Internally feels like she is on fire   . Pravastatin Other (See Comments)    myalgias  . Prevacid [Lansoprazole] Other (See Comments)    Worsened GI side effects  . Red Dye Nausea Only and Swelling  . Rosuvastatin Other (See Comments)    Was not effective controlling lipids  . Simvastatin Other (See Comments)    Leg cramps  . Diflucan [Fluconazole] Nausea And  Vomiting  . Onion     Diarrhea, GI distress, flared IBS  . Other     NOTE: pt is able to take cephalosporins without reaction  . Penicillins Other (See Comments)    CHILDHOOD ALLEGY/REACTION: "told 50 years ago I couldn't take it; might be immune to it", able to take amoxicillin Has patient had a PCN reaction causing immediate rash, facial/tongue/throat swelling, SOB or lightheadedness with hypotension: Unknown Has patient had a PCN reaction causing severe rash involving mucus membranes or skin necrosis: UnknowN Has patient had a PCN reaction that required hospitalization: /Unknown Has patient had a PCN reaction occurring within the last 10 years: Unknown If a  . Sertraline Other (See Comments)    Overly sedating  . Tape Other (See Comments)    All adhesives  - Blisters, itching and burning   . Metaxalone Other (See Comments)    REACTION: unknown     Current Outpatient Medications  Medication Sig Dispense Refill  . acetaminophen (TYLENOL) 500 MG tablet Take 500 mg by mouth daily as needed for moderate pain or headache (PAIN).     Marland Kitchen ALPRAZolam (XANAX) 0.25 MG tablet Take 0.25 mg by mouth at bedtime as needed for anxiety. Patient taking 1/2 tablet    . AMBULATORY NON FORMULARY MEDICATION Medication Name:  1  squatty potty to use as needed 1 each 0  . Ascorbic Acid (VITAMIN C) 1000 MG tablet Take 1,000 mg by mouth See admin instructions. Take 1000 mg daily, may take an additional 1000 mg as needed for cold symptoms    . aspirin EC 81 MG tablet Take 81 mg by mouth at bedtime.     . beclomethasone (QVAR REDIHALER) 80 MCG/ACT inhaler Inhale 2 puffs into the lungs 2 (two) times daily. Rinse mouth after use. 1 g 0  . benzonatate (TESSALON) 200 MG capsule Take 1 capsule (200 mg total) by mouth 3 (three) times daily as needed for cough. 20 capsule 0  . BIOTIN PO Take 1 tablet by mouth daily.    . cholecalciferol (VITAMIN D) 1000 units tablet Take 1,000 Units by mouth daily.     . Coenzyme Q10  (COQ-10) 100 MG CAPS Take 100 mg by mouth daily.    Marland Kitchen dextromethorphan (DELSYM) 30 MG/5ML liquid Take 15 mg by mouth daily as needed for cough.     . Diphenhyd-Hydrocort-Nystatin (FIRST-DUKES MOUTHWASH) SUSP Swish and swallow 5 mLs every 6 (six) hours as needed (mouth blisters). 237 mL 0  . ezetimibe (ZETIA) 10 MG tablet Take 10 mg by mouth at bedtime.    Marland Kitchen GARLIC PO Take 1 capsule by mouth daily.    Marland Kitchen guaiFENesin (MUCINEX) 600 MG 12 hr tablet Take 600 mg by mouth daily.    Marland Kitchen levalbuterol (XOPENEX HFA) 45 MCG/ACT inhaler Inhale 2 puffs into the lungs every 4 (four) hours as needed for wheezing or shortness of breath. 1 Inhaler 3  . LORazepam (ATIVAN) 0.5 MG tablet Take 0.5-1 tablets (0.25-0.5 mg total) by mouth at bedtime as needed for anxiety or sleep. 30 tablet 1  . losartan (COZAAR) 25 MG tablet Take 0.5 tablet (12.5 mg) by mouth once daily at bedtime if the top number on your blood pressure is >90.    . miconazole (MICOTIN) 2 % powder Apply 1 application topically as needed (yeast).    . nystatin cream (MYCOSTATIN) Apply 1 application topically as needed for dry skin (yeast infection).    . Omega-3 Fatty Acids (FISH OIL PO) Take 1 capsule by mouth daily.    . potassium chloride (K-DUR) 10 MEQ tablet Take 10 mEq by mouth daily as needed (with laxis).     Marland Kitchen pyridOXINE (VITAMIN B-6) 100 MG tablet Take 100 mg by mouth daily.    . Red Yeast Rice Extract (RED YEAST RICE PO) Take 1 capsule by mouth daily.    Marland Kitchen torsemide (DEMADEX) 20 MG tablet Take 1 tablet (20 mg total) by mouth daily. Once daily until you achieve dry weight of 167 pounds. 180 tablet 3  . vitamin E 400 UNIT capsule Take 400 Units by mouth daily.    . carvedilol (COREG) 3.125 MG tablet Take 1 tablet (3.125 mg total) by mouth 2 (two) times daily with a meal for 30 days. 60 tablet 0  . spironolactone (ALDACTONE) 25 MG tablet Take 0.5 tablets (12.5 mg total) by mouth daily for 30 days. 15 tablet 0   No current facility-administered  medications for this visit.      Past Medical History:  Diagnosis Date  . Abdominal aortic atherosclerosis ([place]) 11/2015   by CT  . Allergy   . Anemia   . Anxiety   . Automatic implantable cardioverter-defibrillator in situ 2012   a. MDT single lead ICD.  Marland Kitchen Bronchiectasis    a. possibly mild, treat URIs aggressively  .  Cancer ([facility])    basal cell, arms & neck  . CHF (congestive heart failure) ([place])   . Chronic sinusitis   . COPD (chronic obstructive pulmonary disease) ([place])   . Depression   . Dyspnea   . Essential hypertension   . Fibrocystic breast disease   . Fibromyalgia   . GERD (gastroesophageal reflux disease)   . H/O multiple allergies 10/10/2013  . Hemorrhoids   . History of diverticulitis of colon   . History of pneumonia 2012  . Insomnia   . Irritable bowel syndrome   . Laryngeal nodule    Dr. Thomasena Edis  . Migraine without aura, without mention of intractable migraine without mention of status migrainosus   . Mitral regurgitation   . Nonischemic cardiomyopathy ([place])    a. Recovered LV fxn - prev as low as 25%, now 50-55% by echo 04/2014;  b. 2012 s/p MDT [id number] [employer] VR single lead ICD.  Marland Kitchen Osteoarthritis    knees, back, hands, low back,   . Osteoporosis 01/2013   T-2.7 spine (01/2016)  . PONV (postoperative nausea and vomiting)   . sigmoid diverticulitis with perforation, abscess and fistula 09/30/2013  . Sleep apnea 2010   borderline, inconclusive- /w [place]     ROS:   All systems reviewed and negative except as noted in the HPI.   Past Surgical History:  Procedure Laterality Date  . ABDOMINAL HYSTERECTOMY  1987   w/BSO  . ANAL RECTAL MANOMETRY N/A 02/02/2016   Procedure: ANO RECTAL MANOMETRY;  Surgeon: Mauri Pole, MD;  Location: [facility];  Service: Endoscopy;  Laterality: N/A;  . APPENDECTOMY    . AUGMENTATION MAMMAPLASTY    . BIV UPGRADE N/A 11/04/2018   Procedure: BIV ICD  UPGRADE;  Surgeon: Evans Lance, MD;  Location: [facility];  Service: Cardiovascular;  Laterality: N/A;  . breast cystectomies     due to FCBD  . BREAST MASS EXCISION  01/2010   Left-fibrocystic change w/intraductal papilloma, no malignancy  . carotid US  [id number]   [id number] LICA, normal RICA  . [employer] Bunionectomy  11/04/08   Right Great Toe (Dr. Beola Cord)  . COLONOSCOPY  01/2018   1 small benign polyp, diverticulosis, healthy anastomosis [name] @ [facility])  . COLOSTOMY N/A 10/02/2013   DESCENDING COLOSTOMY;  Surgeon: Imogene Burn. Tsuei, [place] N/A 02/10/2014   [name], MD  . EMG/MCV  10/16/01   + mild carpal tunnel  . ESOPHAGOGASTRODUODENOSCOPY  09/2014   small HH, irregular Z line, fundic gland polyps with mild gastritis ([place])  . Flex laryngoscopy  06/11/06   ([place]) nml  . IMPLANTABLE CARDIOVERTER DEFIBRILLATOR IMPLANT N/A 07/14/2011   MDT single chamber ICD  . KNEE ARTHROSCOPY W/ ORIF     Left; "meniscus tear"  . LASIK     left eye  . MANDIBLE SURGERY     "got about 6 pins in the bottom of my jaw"  . NCS  11/2014   L ulnar neuropathy, mild B median nerve entrapment ([place])  . OSTEOTOMY     Left foot  . PARTIAL COLECTOMY N/A 10/02/2013   PARTIAL COLECTOMY;  Surgeon: Imogene Burn. Tsuei, MD  . RIGHT/LEFT HEART CATH AND CORONARY ANGIOGRAPHY N/A 10/16/2018   Procedure: RIGHT/LEFT HEART CATH AND CORONARY ANGIOGRAPHY;  Surgeon: Wellington Hampshire, MD;  Location: [facility];  Service: Cardiovascular;  Laterality: N/A;  . sinus surgery     x3 with balloon   . TOE  AMPUTATION     "toe beside baby toe on left foot; got gangrene from corn"  . TONSILLECTOMY AND ADENOIDECTOMY       Family History  Problem Relation Age of Onset  . Lung cancer Father        + smoker  . Colon polyps Father   . Dementia Mother   . Osteoporosis Mother        Lumbar spine  . Stroke Mother        x 5 @ 85 YOA  . Arthritis Mother        hands  . Emphysema Mother   . Alcohol abuse Paternal Grandfather   . Stroke Paternal  Grandfather        ?  Marland Kitchen Heart disease Paternal Grandfather   . Hypertension Maternal Grandfather        ?  Marland Kitchen Heart disease Paternal Grandmother   . Colon cancer Neg Hx   . Esophageal cancer Neg Hx   . Gallbladder disease Neg Hx      Social History   Socioeconomic History  . Marital status: Married    Spouse name: Not on file  . Number of children: 1  . Years of education: Not on file  . Highest education level: Not on file  Occupational History  . Occupation: Oncologist  . Occupation: [employer]  Social Needs  . Financial resource strain: Not on file  . Food insecurity    Worry: Not on file    Inability: Not on file  . Transportation needs    Medical: Not on file    Non-medical: Not on file  Tobacco Use  . Smoking status: Never Smoker  . Smokeless tobacco: Never Used  . Tobacco comment: Lived with smokers x 23 yrs, smoked herself "for a week"  Substance and Sexual Activity  . Alcohol use: No    Alcohol/week: 0.0 standard drinks  . Drug use: No  . Sexual activity: Not Currently  Lifestyle  . Physical activity    Days per week: Not on file    Minutes per session: Not on file  . Stress: Not on file  Relationships  . Social Herbalist on phone: Not on file    Gets together: Not on file    Attends religious service: Not on file    Active member of club or organization: Not on file    Attends meetings of clubs or organizations: Not on file    Relationship status: Not on file  . Intimate partner violence    Fear of current or ex partner: Not on file    Emotionally abused: Not on file    Physically abused: Not on file    Forced sexual activity: Not on file  Other Topics Concern  . Not on file  Social History Narrative   Lives with husband Herbie [place]   1 adopted child     BP 104/64   Pulse 77   Ht 5' 4.5" (1.638 m)   Wt 176 lb (79.8 kg)   SpO2 97%   BMI 29.74 kg/m   Physical Exam:  Well appearing NAD  HEENT: Unremarkable Neck:  No JVD, no thyromegally Lymphatics:  No adenopathy Back:  No CVA tenderness Lungs:  Clear with no wheezes HEART:  Regular rate rhythm, no murmurs, no rubs, no clicks Abd:  soft, positive bowel sounds, no organomegally, no rebound, no guarding Ext:  2 plus pulses, no edema, no cyanosis, no  clubbing Skin:  No rashes no nodules Neuro:  CN II through XII intact, motor grossly intact  EKG - nsr with biv pacing  DEVICE  Normal device function.  See PaceArt for details.   Assess/Plan: 1. Chronic systolic heart failure - she is not better. We will ask her to return for an AV optimization echo. I will defer changing losartan to entresto to Dr. [name]. She is encouraged to keep walking and to avoid salty foods. 2. ICD - her device is working normally. We discussed additional options including surgical epicardial lead placement.  3. COPD - she is on [employer].D.

## 2019-02-06 NOTE — Patient Instructions (Addendum)
Medication Instructions:  Your physician recommends that you continue on your current medications as directed. Please refer to the Current Medication list given to you today.  Labwork: None ordered.  Testing/Procedures: You will be scheduled for an AV optimization ECHO  Follow-Up: Your physician wants you to follow-up in: 9 months with Dr. Lovena Le.   You will receive a reminder letter in the mail two months in advance. If you don't receive a letter, please call our office to schedule the follow-up appointment.  Remote monitoring is used to monitor your ICD from home. This monitoring reduces the number of office visits required to check your device to one time per year. It allows [place] to keep an eye on the functioning of your device to ensure it is working properly. You are scheduled for a device check from home on 05/12/2019. You may send your transmission at any time that day. If you have a wireless device, the transmission will be sent automatically. After your physician reviews your transmission, you will receive a postcard with your next transmission date.  Any Other Special Instructions Will Be Listed Below (If Applicable).  If you need a refill on your cardiac medications before your next appointment, please call your pharmacy.

## 2019-02-07 ENCOUNTER — Telehealth: Payer: Self-pay | Admitting: Cardiovascular Disease

## 2019-02-07 ENCOUNTER — Other Ambulatory Visit: Payer: Self-pay | Admitting: Family Medicine

## 2019-02-07 DIAGNOSIS — I5022 Chronic systolic (congestive) heart failure: Secondary | ICD-10-CM

## 2019-02-07 NOTE — Telephone Encounter (Signed)
Called patient and let her know I would forward to Dr [name] to review for further recommendations on Entresto.   She also said her weight this morning was 175 lb. From telephone encounter dated 01/23/19 patient had switched to torsemide 20 mg BID for 3 days then to 20 mg daily. Patient still isn't back down to her prior dry weight of 167lb.  Wanted to see what else Dr [name] would like her to do. She saw Dr Lovena Le regarding ICD yesterday (see his note).  Routing to Dr [name] for further advice and to see if he wants patient to come in for an appointment.

## 2019-02-07 NOTE — Telephone Encounter (Signed)
Pt c/o medication issue:  1. Name of Medication: Entresto   2. How are you currently taking this medication (dosage and times per day)?   Not taking will be new     3. Are you having a reaction (difficulty breathing--STAT)?  4. What is your medication issue? Advised by Dr. Lovena Le to change to entresto if ok with Dr. Candis Musa to prep for upcoming wire tweaking .  Patient was told she may also need this if she has to have more advanced heart pump procedure in the future.  Please call.

## 2019-02-09 NOTE — Telephone Encounter (Signed)
Would go up to torsemide 20 BID Need labs/bmp in one week  Dose she want to reconsider trying entresto instead of losartan?

## 2019-02-10 MED ORDER — ENTRESTO 24-26 MG PO TABS: 1.0000 | ORAL_TABLET | Freq: Every day | ORAL | 11 refills | Status: DC

## 2019-02-10 MED ORDER — TORSEMIDE 20 MG PO TABS: 20.0000 mg | ORAL_TABLET | Freq: Two times a day (BID) | ORAL | 3 refills | Status: DC

## 2019-02-10 NOTE — Telephone Encounter (Signed)
Returned call to patient to review POC from Dr. [name].   Torsemide increased to 20 mg BID. STOP Losartan today START Entresto 24/26 mg once daily, after 1 week if BP in acceptable range, go to Entresto BID.   Lab order placed for medical mall in 1 week.   VS today 102/52, HR 76, over the last week wt 172 AM, 178 HS.   Pt agrees to take VS daily and will call in 1 week with readings to possibly go up on Entresto.

## 2019-02-12 DIAGNOSIS — M542 Cervicalgia: Secondary | ICD-10-CM | POA: Diagnosis not present

## 2019-02-17 ENCOUNTER — Other Ambulatory Visit
Admission: RE | Admit: 2019-02-17 | Discharge: 2019-02-17 | Disposition: A | Discharge status: Home / Self Care | Payer: Medicare Other | Source: Ambulatory Visit | Attending: Cardiovascular Disease | Admitting: Cardiovascular Disease

## 2019-02-17 ENCOUNTER — Other Ambulatory Visit: Payer: Self-pay | Admitting: Internal Medicine

## 2019-02-17 DIAGNOSIS — I5022 Chronic systolic (congestive) heart failure: Secondary | ICD-10-CM | POA: Diagnosis not present

## 2019-02-17 LAB — BASIC METABOLIC PANEL
Anion gap: 12 (ref 5–15)
BUN: 33 mg/dL — ABNORMAL HIGH (ref 8–23)
CO2: 25 mmol/L (ref 22–32)
Calcium: 9.6 mg/dL (ref 8.9–10.3)
Chloride: 98 mmol/L (ref 98–111)
Creatinine, Ser: 1.09 mg/dL — ABNORMAL HIGH (ref 0.44–1.00)
GFR calc Af Amer: 58 mL/min — ABNORMAL LOW (ref >60)
GFR calc non Af Amer: 50 mL/min — ABNORMAL LOW (ref >60)
Glucose, Bld: 115 mg/dL — ABNORMAL HIGH (ref 70–99)
Potassium: 4.1 mmol/L (ref 3.5–5.1)
Sodium: 135 mmol/L (ref 135–145)

## 2019-02-19 ENCOUNTER — Telehealth: Payer: Self-pay | Admitting: Internal Medicine

## 2019-02-19 NOTE — Telephone Encounter (Signed)
[place] for patient to have 3-D mammogram . Implant date over 15 weeks ago. Patient informed that she should let technician know that she has the device and it's location to assist with comfort during priocedure.

## 2019-02-19 NOTE — Telephone Encounter (Signed)
New Message:    Pt wants to know if it will be alright for her to have a 3 D Mammogram since she has a new device?

## 2019-02-20 DIAGNOSIS — M542 Cervicalgia: Secondary | ICD-10-CM | POA: Diagnosis not present

## 2019-02-21 ENCOUNTER — Telehealth: Payer: Self-pay | Admitting: Cardiovascular Disease

## 2019-02-21 NOTE — Telephone Encounter (Signed)
Patient wants to know if it is safe to have echo and also ct to look at device leads.    Please call to discuss and to see if this and mammogram are too much exposure for radiation.

## 2019-02-21 NOTE — Telephone Encounter (Signed)
Spoke with the patient. Advised her that the echo does not expose her to radiation. She has already been given the ok to proceed with her scheduled mammogram by Dr. Lovena Le.  Patient sts that she has been taking torsemide for about 10 days and her weights have still been fluctuating between 172-176 lbs. She has not been able to get her weight under 172lbs. Patient sts that the last time she was on torsemide it caused AKI. She had a bmet on 10/5 and would like the results. Advised the patient that I will fwd an update to Dr. [name] and we will call back with his response and recommendation.

## 2019-02-22 NOTE — Telephone Encounter (Signed)
Renal function stable Would look to keep her weight were it is based on the labs from 10/5 Keep doing as she is doing

## 2019-02-24 DIAGNOSIS — Z1231 Encounter for screening mammogram for malignant neoplasm of breast: Secondary | ICD-10-CM | POA: Diagnosis not present

## 2019-02-24 DIAGNOSIS — M810 Age-related osteoporosis without current pathological fracture: Secondary | ICD-10-CM | POA: Diagnosis not present

## 2019-02-24 DIAGNOSIS — Z124 Encounter for screening for malignant neoplasm of cervix: Secondary | ICD-10-CM | POA: Diagnosis not present

## 2019-02-24 DIAGNOSIS — B372 Candidiasis of skin and nail: Secondary | ICD-10-CM | POA: Diagnosis not present

## 2019-02-24 DIAGNOSIS — Z01419 Encounter for gynecological examination (general) (routine) without abnormal findings: Secondary | ICD-10-CM | POA: Diagnosis not present

## 2019-02-24 DIAGNOSIS — N898 Other specified noninflammatory disorders of vagina: Secondary | ICD-10-CM | POA: Diagnosis not present

## 2019-02-24 LAB — HM MAMMOGRAPHY

## 2019-02-24 NOTE — Telephone Encounter (Signed)
Call to patient to discuss advice from Dr. [name]. Pt verbalized understanding and had no further questions at this time.

## 2019-02-26 ENCOUNTER — Ambulatory Visit (INDEPENDENT_AMBULATORY_CARE_PROVIDER_SITE_OTHER): Payer: Medicare Other | Admitting: Student

## 2019-02-26 ENCOUNTER — Other Ambulatory Visit: Payer: Self-pay

## 2019-02-26 ENCOUNTER — Ambulatory Visit (HOSPITAL_COMMUNITY): Payer: Medicare Other | Attending: Internal Medicine

## 2019-02-26 DIAGNOSIS — Z95810 Presence of automatic (implantable) cardiac defibrillator: Secondary | ICD-10-CM

## 2019-02-26 DIAGNOSIS — I10 Essential (primary) hypertension: Secondary | ICD-10-CM

## 2019-02-26 DIAGNOSIS — I5022 Chronic systolic (congestive) heart failure: Secondary | ICD-10-CM

## 2019-02-26 DIAGNOSIS — M542 Cervicalgia: Secondary | ICD-10-CM | POA: Diagnosis not present

## 2019-02-26 LAB — CUP PACEART INCLINIC DEVICE CHECK
Battery Remaining Longevity: 93 mo
Battery Voltage: 3.01 V
Brady Statistic AP VP Percent: 0.09 %
Brady Statistic AP VS Percent: 0.01 %
Brady Statistic AS VP Percent: 99.72 %
Brady Statistic AS VS Percent: 0.18 %
Brady Statistic RA Percent Paced: 0.1 %
Brady Statistic RV Percent Paced: 99.34 %
Date Time Interrogation Session: 20201014133634
HighPow Impedance: 86 Ohm
Implantable Lead Implant Date: 20130301
Implantable Lead Implant Date: 20200622
Implantable Lead Implant Date: 20200622
Implantable Lead Location: 753859
Implantable Lead Location: 753860
Implantable Lead Location: 753860
Implantable Lead Model: 3830
Implantable Lead Model: 5076
Implantable Lead Model: 6935
Implantable Pulse Generator Implant Date: 20200622
Lead Channel Impedance Value: 304 Ohm
Lead Channel Impedance Value: 342 Ohm
Lead Channel Impedance Value: 399 Ohm
Lead Channel Impedance Value: 456 Ohm
Lead Channel Impedance Value: 513 Ohm
Lead Channel Impedance Value: 570 Ohm
Lead Channel Pacing Threshold Amplitude: 0.375 V
Lead Channel Pacing Threshold Amplitude: 0.625 V
Lead Channel Pacing Threshold Amplitude: 0.75 V
Lead Channel Pacing Threshold Pulse Width: 0.4 ms
Lead Channel Pacing Threshold Pulse Width: 0.4 ms
Lead Channel Pacing Threshold Pulse Width: 0.4 ms
Lead Channel Sensing Intrinsic Amplitude: 11.75 mV
Lead Channel Sensing Intrinsic Amplitude: 2.25 mV
Lead Channel Sensing Intrinsic Amplitude: 2.5 mV
Lead Channel Sensing Intrinsic Amplitude: 7.125 mV
Lead Channel Setting Pacing Amplitude: 1 V
Lead Channel Setting Pacing Amplitude: 1.5 V
Lead Channel Setting Pacing Amplitude: 2.5 V
Lead Channel Setting Pacing Pulse Width: 0.4 ms
Lead Channel Setting Pacing Pulse Width: 0.4 ms
Lead Channel Setting Sensing Sensitivity: 0.3 mV

## 2019-02-26 NOTE — Progress Notes (Signed)
    Electrophysiology CRT AV optimization   Date: 02/26/2019  ID:  Ashley [name], DOB April 29, 1946, MRN [id number]  PCP: [name], MD Primary Cardiologist: [name], MD Electrophysiologist: Cristopher Peru, MD   CC: Heart failure despite LV lead placement  Medtronic ICD initially implanted 07/2011, upgraded 11/04/2018 to CRT-D for LBBB  History of appropriate therapy: No History of AAD therapy: No  LV lead History: Model: 3830 Location: HIS/RV -> LBB Threshold 0.375 V @ 0.4 ms Vector : LV tip to LV ring Revisions/CXR: Post op only Diaphragmatic Stim: Not noted VV timing: Not performed today.  Prior optimzation/changes: Previously Non-adaptive CRT, S-AV 100 ms, LV->RV 20 ms  Echocardiogram: Pre-device implant: 10/14/2018 LVEF 15-20% Post-device implant: Pending today.   EKG: Pre-device implant: 10/15/2018 NSR 78 bpm, QRS 152 ms, PR 148 ms Post-device implant: 02/06/2019 NSR 77 bpm, QRS 138 ms, PR 138. Positive Lead 1, negative V1  ICD interrogation: CRT pacing: 99.32% AF: 0% PVC burden: ---% Thoracic impedence: Stable Activity Level: 2-3 hrs daily HR excursion: Stable 70-90s See PaceArt report for full details.   EKG:  EKG is not ordered today. Consider VV optimization at later date based on response to AV optimization.   Assessment and Plan:  1.  Chronic systolic heart failure Patient reports stable to improved status post CRT implant Device interrogation today demonstrates fair separation of E and A waves without truncation of A waves at previous programming of non-adatpive 110 with LV-> RV 20 ms. Pt optimized under echocardiography with assistance from MDT industry Rep  Pt programmed to NON-Adaptive CRT, with LV -> RV 57ms, with Sensed AV of 120 ms.   Intrinsic A-V was 150 ms.     Disposition:   FU with EP-APP in 2 months for follow up and VV optimization consideration.   Jacalyn Lefevre, PA-C  02/26/2019 9:56 AM  [facility]  [address] [place] [address] [phone] (office) [phone] (fax

## 2019-02-27 ENCOUNTER — Encounter: Payer: Self-pay | Admitting: Family Medicine

## 2019-02-27 ENCOUNTER — Ambulatory Visit (INDEPENDENT_AMBULATORY_CARE_PROVIDER_SITE_OTHER): Payer: Medicare Other | Admitting: Family Medicine

## 2019-02-27 VITALS — BP 105/68 | Temp 97.6°F | Ht 64.5 in | Wt 172.0 lb

## 2019-02-27 DIAGNOSIS — I700 Atherosclerosis of aorta: Secondary | ICD-10-CM | POA: Diagnosis not present

## 2019-02-27 DIAGNOSIS — I5022 Chronic systolic (congestive) heart failure: Secondary | ICD-10-CM

## 2019-02-27 DIAGNOSIS — M542 Cervicalgia: Secondary | ICD-10-CM | POA: Diagnosis not present

## 2019-02-27 DIAGNOSIS — M797 Fibromyalgia: Secondary | ICD-10-CM

## 2019-02-27 DIAGNOSIS — I051 Rheumatic mitral insufficiency: Secondary | ICD-10-CM | POA: Diagnosis not present

## 2019-02-27 DIAGNOSIS — F331 Major depressive disorder, recurrent, moderate: Secondary | ICD-10-CM

## 2019-02-27 DIAGNOSIS — E782 Mixed hyperlipidemia: Secondary | ICD-10-CM | POA: Diagnosis not present

## 2019-02-27 DIAGNOSIS — M810 Age-related osteoporosis without current pathological fracture: Secondary | ICD-10-CM | POA: Diagnosis not present

## 2019-02-27 DIAGNOSIS — N1831 Chronic kidney disease, stage 3a: Secondary | ICD-10-CM | POA: Diagnosis not present

## 2019-02-27 DIAGNOSIS — F411 Generalized anxiety disorder: Secondary | ICD-10-CM

## 2019-02-27 DIAGNOSIS — I420 Dilated cardiomyopathy: Secondary | ICD-10-CM

## 2019-02-27 DIAGNOSIS — I428 Other cardiomyopathies: Secondary | ICD-10-CM

## 2019-02-27 NOTE — Assessment & Plan Note (Signed)
Continues aspirin, zetia, RYR.

## 2019-02-27 NOTE — Assessment & Plan Note (Signed)
Reviewed recent situational episodes of depression/anxiety. Pt not currently interested in medication, will continue lorazepam PRN.

## 2019-02-27 NOTE — Assessment & Plan Note (Signed)
Reviewed latest GYN visit - rec bisphosphonate holiday for 2 yrs, with rpt DEXA planned 2021

## 2019-02-27 NOTE — Assessment & Plan Note (Addendum)
10/2018 hospitalization with EF 15-20%, latest echo showing improvement to 30-35% EF. Still not feeling well. She states she had BiV ICD tweaked yesterday with EP. Appreciate EP care. Continues torsemide 20mg  BID, states she's taking spironolactone 12.5mg  MWF and 25mg  on Saturday. She was recently started on entresto in place of losartan.

## 2019-02-27 NOTE — Progress Notes (Signed)
Virtual visit completed through [employer]. Due to national recommendations of social distancing due to COVID-19, a virtual visit is felt to be most appropriate for this patient at this time. Reviewed limitations of a virtual visit.   Patient location: friend's home in [place] - who she is visiting Provider location: Financial controller at [facility], office If any vitals were documented, they were collected by patient at home unless specified below.    BP 105/68   Temp 97.6 F (36.4 C)   Ht 5' 4.5" (1.638 m)   Wt 172 lb (78 kg)   BMI 29.07 kg/m    CC: 3 mo f/u visit Subjective:    Patient ID: Ashley [name], female    DOB: [birth year], 73 y.o.   MRN: [id number]  HPI: Ashley [name] is a 73 y.o. female presenting on 02/27/2019 for Follow-up (3 mo f/u.)   Seen here last month with ST with benign exam at that time duke's mouthwash refilled. Not consistent with thrush.   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 Nonobstructive CAD by cath. BiV ICD pacemaker placed at that time. EF at that time 15-20%.   Saw EP last month and again yesterday, notes reviewed. S/p CRT implant. Latest echo yesterday showed EF 30-35%. Losartan changed to Entresto. She is now on torsemide 74m bid. Currently weight 172 lbs in am, 176 lbs in pm. She doesn't want heart pump.   Neck pain - saw ortho treated with prednisone taper, using soft collar, started PT last month with benefit (80% better).   Saw GYN earlier this week - planned another year of bisphosphonate holiday with repeat DEXA 2021.   Having bouts of depression that last 1-2 days then resolve. She has been taking lorazepam PRN for this.   HLD - she has started taking RYR 2 tablets daily in addition to her zetia.      Relevant past medical, surgical, family and social history reviewed and updated as indicated. Interim medical history since our last visit reviewed. Allergies and  medications reviewed and updated. Outpatient Medications Prior to Visit  Medication Sig Dispense Refill  . acetaminophen (TYLENOL) 500 MG tablet Take 500 mg by mouth daily as needed for moderate pain or headache (PAIN).     . AMBULATORY NON FORMULARY MEDICATION Medication Name:  1 squatty potty to use as needed 1 each 0  . Ascorbic Acid (VITAMIN C) 1000 MG tablet Take 1,000 mg by mouth See admin instructions. Take 1000 mg daily, may take an additional 1000 mg as needed for cold symptoms    . aspirin EC 81 MG tablet Take 81 mg by mouth at bedtime.     . beclomethasone (QVAR REDIHALER) 80 MCG/ACT inhaler Inhale 2 puffs into the lungs 2 (two) times daily. Rinse mouth after use. 1 g 0  . benzonatate (TESSALON) 200 MG capsule Take 1 capsule (200 mg total) by mouth 3 (three) times daily as needed for cough. 20 capsule 0  . BIOTIN PO Take 1 tablet by mouth daily.    . carvedilol (COREG) 3.125 MG tablet Take 1 tablet (3.125 mg total) by mouth 2 (two) times daily with a meal for 30 days. 60 tablet 0  . cholecalciferol (VITAMIN D) 1000 units tablet Take 1,000 Units by mouth daily.     . Coenzyme Q10 (COQ-10) 100 MG CAPS Take 100 mg by mouth daily.    .Marland Kitchendextromethorphan (DELSYM) 30 MG/5ML liquid Take 15 mg by mouth  daily as needed for cough.     . Diphenhyd-Hydrocort-Nystatin (FIRST-DUKES MOUTHWASH) SUSP Swish and swallow 5 mLs every 6 (six) hours as needed (mouth blisters). 237 mL 0  . ezetimibe (ZETIA) 10 MG tablet TAKE 1 TABLET (10 MG TOTAL) BY MOUTH DAILY. 90 tablet 2  . GARLIC PO Take 1 capsule by mouth daily.    Marland Kitchen guaiFENesin (MUCINEX) 600 MG 12 hr tablet Take 600 mg by mouth daily.    Marland Kitchen levalbuterol (XOPENEX HFA) 45 MCG/ACT inhaler Inhale 2 puffs into the lungs every 4 (four) hours as needed for wheezing or shortness of breath. 1 Inhaler 3  . LORazepam (ATIVAN) 0.5 MG tablet Take 0.5-1 tablets (0.25-0.5 mg total) by mouth at bedtime as needed for anxiety or sleep. 30 tablet 1  . miconazole (MICOTIN)  2 % powder Apply 1 application topically as needed (yeast).    . nystatin cream (MYCOSTATIN) Apply 1 application topically as needed for dry skin (yeast infection).    . Omega-3 Fatty Acids (FISH OIL PO) Take 1 capsule by mouth daily.    . potassium chloride (K-DUR) 10 MEQ tablet Take 10 mEq by mouth daily as needed (with laxis).     Marland Kitchen pyridOXINE (VITAMIN B-6) 100 MG tablet Take 100 mg by mouth daily.    . Red Yeast Rice Extract (RED YEAST RICE PO) Take 1 capsule by mouth daily.    . sacubitril-valsartan (ENTRESTO) 24-26 MG Take 1 tablet by mouth daily. 30 tablet 11  . torsemide (DEMADEX) 20 MG tablet Take 1 tablet (20 mg total) by mouth 2 (two) times daily. Once daily until you achieve dry weight of 167 pounds. 180 tablet 3  . vitamin E 400 UNIT capsule Take 400 Units by mouth daily.    Marland Kitchen ALPRAZolam (XANAX) 0.25 MG tablet Take 0.25 mg by mouth at bedtime as needed for anxiety. Patient taking 1/2 tablet    . spironolactone (ALDACTONE) 25 MG tablet Take 0.5 tablets (12.5 mg total) by mouth daily for 30 days. 15 tablet 0   No facility-administered medications prior to visit.      Per HPI unless specifically indicated in ROS section below Review of Systems Objective:    BP 105/68   Temp 97.6 F (36.4 C)   Ht 5' 4.5" (1.638 m)   Wt 172 lb (78 kg)   BMI 29.07 kg/m   Wt Readings from Last 3 Encounters:  02/27/19 172 lb (78 kg)  02/06/19 176 lb (79.8 kg)  02/04/19 174 lb (78.9 kg)     Physical exam: Gen: alert, NAD, not ill appearing Pulm: speaks in complete sentences without increased work of breathing Psych: normal mood, normal thought content      Lab Results  Component Value Date   CREATININE 1.09 (H) 02/17/2019   BUN 33 (H) 02/17/2019   NA 135 02/17/2019   K 4.1 02/17/2019   CL 98 02/17/2019   CO2 25 02/17/2019    Assessment & Plan:   Problem List Items Addressed This Visit    Osteoporosis    Reviewed latest GYN visit - rec bisphosphonate holiday for 2 yrs, with rpt  DEXA planned 2021      Mixed hyperlipidemia    She continues 2 RYR + zetia daily. Will return in 2 wks for FLP. The 10-year ASCVD risk score Ashley Bussing DC Jr., et al., 2013) is: 12.3%   Values used to calculate the score:     Age: 51 years     Sex: Female  Is Non-Hispanic African American: No     Diabetic: No     Tobacco smoker: No     Systolic Blood Pressure: 956 mmHg     Is BP treated: Yes     HDL Cholesterol: 45.2 mg/dL     Total Cholesterol: 231 mg/dL       Relevant Orders   Lipid panel   Mitral regurgitation   MDD (major depressive disorder), recurrent episode, moderate (HCC)   Fibromyalgia   CKD (chronic kidney disease) stage 3, GFR 30-59 ml/min (HCC)    I did ask her to return in 2 weeks for lab visit to recheck kidney function.       Relevant Orders   Renal function panel   Chronic systolic CHF (congestive heart failure) ([place]) - Primary    10/2018 hospitalization with EF 15-20%, latest echo showing improvement to 30-35% EF. Still not feeling well. She states she had BiV ICD tweaked yesterday with EP. Appreciate EP care. Continues torsemide 35m BID, states she's taking spironolactone 12.525mMWF and 2584mn Saturday. She was recently started on entresto in place of losartan.       Cervical pain (neck)    Established with [facility], undergoing PT with benefit.       Cardiomyopathy, dilated, nonischemic (HCC)   Anxiety state    Reviewed recent situational episodes of depression/anxiety. Pt not currently interested in medication, will continue lorazepam PRN.       Abdominal aortic atherosclerosis (HCC)    Continues aspirin, zetia, RYR.        Other Visit Diagnoses    Nonischemic cardiomyopathy ([facility]         No orders of the defined types were placed in this encounter.  Orders Placed This Encounter  Procedures  . Lipid panel    Standing Status:   Future    Standing Expiration Date:   02/27/2020  . Renal function panel    Standing Status:   Future     Standing Expiration Date:   02/27/2020    I discussed the assessment and treatment plan with the patient. The patient was provided an opportunity to ask questions and all were answered. The patient agreed with the plan and demonstrated an understanding of the instructions. The patient was advised to call back or seek an in-person evaluation if the symptoms worsen or if the condition fails to improve as anticipated.  Follow up plan: No follow-ups on file.  Ashley [name]

## 2019-02-27 NOTE — Assessment & Plan Note (Signed)
She continues 2 RYR + zetia daily. Will return in 2 wks for FLP. The 10-year ASCVD risk score Mikey Bussing DC Brooke Bonito., et al., 2013) is: 12.3%   Values used to calculate the score:     Age: 73 years     Sex: Female     Is Non-Hispanic African American: No     Diabetic: No     Tobacco smoker: No     Systolic Blood Pressure: 123456 mmHg     Is BP treated: Yes     HDL Cholesterol: 45.2 mg/dL     Total Cholesterol: 231 mg/dL

## 2019-02-27 NOTE — Assessment & Plan Note (Signed)
I did ask her to return in 2 weeks for lab visit to recheck kidney function.

## 2019-02-27 NOTE — Assessment & Plan Note (Signed)
Established with [facility], undergoing PT with benefit.

## 2019-03-03 ENCOUNTER — Other Ambulatory Visit: Payer: Medicare Other

## 2019-03-05 DIAGNOSIS — M542 Cervicalgia: Secondary | ICD-10-CM | POA: Diagnosis not present

## 2019-03-06 DIAGNOSIS — Z6829 Body mass index (BMI) 29.0-29.9, adult: Secondary | ICD-10-CM | POA: Diagnosis not present

## 2019-03-06 DIAGNOSIS — M47812 Spondylosis without myelopathy or radiculopathy, cervical region: Secondary | ICD-10-CM | POA: Diagnosis not present

## 2019-03-10 ENCOUNTER — Ambulatory Visit: Payer: Medicare Other | Admitting: Pulmonary Disease

## 2019-03-17 DIAGNOSIS — H2513 Age-related nuclear cataract, bilateral: Secondary | ICD-10-CM | POA: Diagnosis not present

## 2019-03-18 ENCOUNTER — Other Ambulatory Visit (INDEPENDENT_AMBULATORY_CARE_PROVIDER_SITE_OTHER): Payer: Medicare Other

## 2019-03-18 DIAGNOSIS — E782 Mixed hyperlipidemia: Secondary | ICD-10-CM | POA: Diagnosis not present

## 2019-03-18 DIAGNOSIS — N1831 Chronic kidney disease, stage 3a: Secondary | ICD-10-CM | POA: Diagnosis not present

## 2019-03-18 LAB — RENAL FUNCTION PANEL
Albumin: 4.5 g/dL (ref 3.5–5.2)
BUN: 29 mg/dL — ABNORMAL HIGH (ref 6–23)
CO2: 28 mEq/L (ref 19–32)
Calcium: 9.8 mg/dL (ref 8.4–10.5)
Chloride: 101 mEq/L (ref 96–112)
Creatinine, Ser: 1.16 mg/dL (ref 0.40–1.20)
GFR: 45.71 mL/min — ABNORMAL LOW (ref >60.00)
Glucose, Bld: 108 mg/dL — ABNORMAL HIGH (ref 70–99)
Phosphorus: 3.8 mg/dL (ref 2.3–4.6)
Potassium: 4.4 mEq/L (ref 3.5–5.1)
Sodium: 140 mEq/L (ref 135–145)

## 2019-03-18 LAB — LIPID PANEL
Cholesterol: 217 mg/dL — ABNORMAL HIGH (ref 0–200)
HDL: 46.6 mg/dL (ref >39.00)
LDL Cholesterol: 131 mg/dL — ABNORMAL HIGH (ref 0–99)
NonHDL: 170.86
Total CHOL/HDL Ratio: 5
Triglycerides: 199 mg/dL — ABNORMAL HIGH (ref 0.0–149.0)
VLDL: 39.8 mg/dL (ref 0.0–40.0)

## 2019-03-21 ENCOUNTER — Ambulatory Visit (INDEPENDENT_AMBULATORY_CARE_PROVIDER_SITE_OTHER): Payer: Medicare Other | Admitting: Pulmonary Disease

## 2019-03-21 ENCOUNTER — Encounter: Payer: Self-pay | Admitting: Pulmonary Disease

## 2019-03-21 ENCOUNTER — Other Ambulatory Visit: Payer: Self-pay

## 2019-03-21 VITALS — BP 112/62 | HR 95 | Temp 98.0°F | Ht 65.0 in | Wt 175.0 lb

## 2019-03-21 DIAGNOSIS — R0602 Shortness of breath: Secondary | ICD-10-CM | POA: Diagnosis not present

## 2019-03-21 DIAGNOSIS — J4520 Mild intermittent asthma, uncomplicated: Secondary | ICD-10-CM

## 2019-03-21 NOTE — Progress Notes (Signed)
Subjective:    Patient ID: Ashley [name], female    DOB: 1946/03/15, 73 y.o.   MRN: [id number]  HPI Patient is a 73 year old very complex, lifelong never smoker presents for follow-up on chronic recurrent bronchitis.  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  She previously followed with Dr. Ashby Dawes and I am assuming care as he is no longer in the practice.  She has tried multiple inhalers with little results.  Her most recent pulmonary function test was performed in September 2018 and it was essentially with just very mild decrease in the FEV1/FVC ratio.  In particular: FEV1 2.29 L or 104% predicted, FVC 3.52 L or 123% of predicted, FEV1/FVC equals 65%.  There was no air trapping and no hyperinflation her volumes were totally normal.  Diffusion capacity was mildly reduced at 78%.  There has been mention of mild emphysema on a prior CT however this is not available for my review this is an outside film.  Prior CT abdomen that I was able to see from February 2019 shows mild scarring at the bases on the lung bases imaging.  She has significant nonischemic cardiomyopathy with LVEF of 30 to 35% and severe global hypokinesis and has a pacer defibrillator in place.  Has had some changes lately due to need for rewiring and change of pacer etc.  The patient has numerous medication intolerances.  These have been noted.  Currently she only uses Xopenex as needed she states it "calms her down".  She was prescribed QVAR by Dr. Ashby Dawes during her last visit however she has found a reason why not to use it and that it gave her "immediate thrush".  She states that she was rinsing well after use.  She does endorse issues with depression and anxiety and states that lorazepam helps her with this.  She has not had any fevers, chills or sweats.  No hemoptysis.  She states that she gets about a pea-sized expectoration of sputum that is usually white occasionally light green and  usually only in the mornings.  She has no other expectoration of sputum throughout the day.  She does have chronic orthopnea and does suffer from paroxysmal nocturnal dyspnea symptoms mostly related to her cardiac disease.  Data: **Echo 02/26/19 >> LVEF 30 to 35%, severe decrease in function, severe global hypokinesis **Cardiac catheterization 10/16/2018>> EF 20%, normal coronary arteries. **Echocardiogram 10/14/2018>> EF 50%.  Severe mitral valve regurgitation **Echo 08/07/16; EF=40% **CBC 10/27/16; Eos ranges from 100-300.  **Sleep study 12/05/2016; negative for sleep apnea, snoring with insomnia with poor sleep efficiency and frequent arousals. **CXR films 11/14/16; mild hyperinflation **PFTs 02/06/2017: FEV1 2.29 L or 104% predicted, FVC 3.52 L or 123% predicted, FEV1/FVC 65% normal lung volumes, diffusion capacity 78%.  Consistent with minimal obstructive defect.   Review of Systems A 10 point review of systems was performed and it is positive at almost every organ system with the pertinents as noted above.    Objective:   Physical Exam BP 112/62 (BP Location: Left Arm, Cuff Size: Normal)   Pulse 95   Temp 98 F (36.7 C) (Temporal)   Ht 5\' 5"  (1.651 m)   Wt 175 lb (79.4 kg)   SpO2 94%   BMI 29.12 kg/m  General Appearance: No distress, overweight Neuro:without focal findings, mental status, speech normal, alert and oriented HEENT: PERRLA, EOM intact, sclera anicteric Pulmonary: No wheezing, No rales, excellent air entry Cardiovascular: Normal S1,S2.  No m/r/g.  Pacer/defib on left Abdomen: Benign, Soft, non-tender, No masses Renal:  No costovertebral tenderness  Endo: No evident thyromegaly, no signs of acromegaly or Cushing features Skin:   warm, no rashes, no ecchymosis  Extremities: normal, no cyanosis, clubbing.     Assessment & Plan:   1.  Dyspnea/shortness of breath: This is likely more related to her cardiac issues which are extensive.  She has very minimal obstructive  defect on PFTs and the findings are not consistent with the severity of her dyspnea symptoms.  Will reassess with PFTs.  2.  Mild intermittent asthma without complication: Suspect that the patient's primary issues that of asthma.  We will give her a trial of the QVAR again using 1 puff twice a day with careful rinsing with a little baking soda in the water she was instructed to discontinue if she has untoward side effect.  Continue use of as needed Xopenex.  3.  Nonischemic cardiomyopathy, severe: This issue is the main culprit with regards to her issues with shortness of breath.  She is urged to continue to follow with cardiology in this regard.   She will be seen in follow-up in 6 months time.  If she remains stable from our standpoint she may continue follow-up with her various other physicians.   This chart was dictated using voice recognition software/Dragon.  Despite best efforts to proofread, errors can occur which can change the meaning.  Any change was purely unintentional.   C. Derrill Kay, MD [facility]

## 2019-03-21 NOTE — Patient Instructions (Signed)
1.  Resume QVAR 1 puff twice a day.  Hopefully this will help with your cough and your phlegm production.  2.  Rinse your mouth well after the Qvar with water with some baking soda in it (example: One quarter of a teaspoon baking soda in 8 ounces of water).  3.  Continue use of Xopenex as needed.  4.  We will schedule breathing tests.  5.  We will see him in follow-up in 6 months time call sooner should any new difficulties arise.  We will call you with the results of the breathing tests when ready.

## 2019-03-24 DIAGNOSIS — D472 Monoclonal gammopathy: Secondary | ICD-10-CM | POA: Diagnosis not present

## 2019-03-25 DIAGNOSIS — M542 Cervicalgia: Secondary | ICD-10-CM | POA: Diagnosis not present

## 2019-04-08 DIAGNOSIS — M542 Cervicalgia: Secondary | ICD-10-CM | POA: Diagnosis not present

## 2019-04-22 ENCOUNTER — Telehealth: Payer: Self-pay | Admitting: Cardiovascular Disease

## 2019-04-22 NOTE — Telephone Encounter (Signed)
Pt c/o medication issue:  1. Name of Medication: carvedilol   2. How are you currently taking this medication (dosage and times per day)? 6.325 mg po BID (patient struggling to articulate an answer and when prompted answers a different question. )   3. Are you having a reaction (difficulty breathing--STAT)? Patient complains of tiredness like previous issue with higher dose of carvedilol   4. What is your medication issue? She had to get a replacement bottle at [facility] because she misplaced hers.  Patient believes they filled the wrong dose and thinks she is to take 3.125 mg po BID

## 2019-04-22 NOTE — Telephone Encounter (Signed)
Call to patient to further discuss. Per chart dose was decreased to 3.125 last June.  Attempted to call patient. LMTCB 04/22/2019

## 2019-04-23 DIAGNOSIS — L570 Actinic keratosis: Secondary | ICD-10-CM | POA: Diagnosis not present

## 2019-04-23 DIAGNOSIS — D1723 Benign lipomatous neoplasm of skin and subcutaneous tissue of right leg: Secondary | ICD-10-CM | POA: Diagnosis not present

## 2019-04-23 DIAGNOSIS — L918 Other hypertrophic disorders of the skin: Secondary | ICD-10-CM | POA: Diagnosis not present

## 2019-04-23 DIAGNOSIS — L821 Other seborrheic keratosis: Secondary | ICD-10-CM | POA: Diagnosis not present

## 2019-04-23 NOTE — Telephone Encounter (Signed)
Left voicemail message to call back  

## 2019-04-24 ENCOUNTER — Other Ambulatory Visit: Payer: Self-pay | Admitting: Neurosurgery

## 2019-04-24 DIAGNOSIS — M4302 Spondylolysis, cervical region: Secondary | ICD-10-CM

## 2019-04-24 DIAGNOSIS — M47812 Spondylosis without myelopathy or radiculopathy, cervical region: Secondary | ICD-10-CM | POA: Diagnosis not present

## 2019-04-25 NOTE — Telephone Encounter (Signed)
Left voicemail message to call back  

## 2019-04-28 ENCOUNTER — Other Ambulatory Visit: Payer: Self-pay | Admitting: Orthopedic Surgery

## 2019-04-28 ENCOUNTER — Telehealth: Payer: Self-pay | Admitting: Nurse Practitioner

## 2019-04-28 DIAGNOSIS — G8314 Monoplegia of lower limb affecting left nondominant side: Secondary | ICD-10-CM | POA: Diagnosis not present

## 2019-04-28 DIAGNOSIS — M25572 Pain in left ankle and joints of left foot: Secondary | ICD-10-CM | POA: Diagnosis not present

## 2019-04-28 DIAGNOSIS — G8311 Monoplegia of lower limb affecting right dominant side: Secondary | ICD-10-CM | POA: Diagnosis not present

## 2019-04-28 DIAGNOSIS — G8929 Other chronic pain: Secondary | ICD-10-CM

## 2019-04-28 DIAGNOSIS — M545 Low back pain, unspecified: Secondary | ICD-10-CM

## 2019-04-28 NOTE — Telephone Encounter (Signed)
Spoke with patient and she states that she had picked up medication from pharmacy but lost it. She had to go and purchase some more and they only had 6.25 mg dosage. She started cutting those in half to get the same dosage as prescribed. She did want to know if she can have a myelogram done safely with her current heart status. She wants to make sure that it would not put any additional strain on her heart. Advised that I would check with Dr. [name] and would be in touch with his recommendations. She verbalized understanding with no further questions at this time.

## 2019-04-28 NOTE — Telephone Encounter (Signed)
Patient returning call.

## 2019-04-28 NOTE — Telephone Encounter (Signed)
Phone call to patient to verify medication list and allergies for myelogram procedure. Pt aware she will not need to hold any medications for this procedure. Pre and post procedure instructions reviewed with pt. Pt verbalized understanding. Pt is concerned about her ability to have procedure with her progressive heart failure and is currently awaiting approval from her cardiologist before proceeding with the procedure. Pt will be scheduled if/when approval is granted.

## 2019-04-29 ENCOUNTER — Telehealth: Payer: Self-pay | Admitting: *Deleted

## 2019-04-29 NOTE — Telephone Encounter (Signed)
LVM 12/15

## 2019-04-29 NOTE — Telephone Encounter (Signed)
   [facility] Pre-operative Risk Assessment    Request for surgical clearance:  1. What type of surgery is being performed? CT MYELOGRAM OF LUMBAR   2. When is this surgery scheduled? TBD   3. What type of clearance is required (medical clearance vs. Pharmacy clearance to hold med vs. Both)? MEDICAL  4. Are there any medications that need to be held prior to surgery and how long? ASA    5. Practice name and name of physician performing surgery? [facility]; DR. GIOFFRE   6. What is your office phone number [phone]    7.   What is your office fax number [phone]  8.   Anesthesia type (None, local, MAC, general) ? NONE LISTED   Ashley [name] 04/29/2019, 3:20 PM  _________________________________________________________________   (provider comments below)

## 2019-04-30 NOTE — Telephone Encounter (Signed)
Spoke with patient and confirmed that provider was fine with her having that done. She verbalized understanding with no further questions at this time.

## 2019-04-30 NOTE — Telephone Encounter (Signed)
Should be fine to proceed

## 2019-04-30 NOTE — Telephone Encounter (Signed)
Patient returning call to discuss clearance .

## 2019-04-30 NOTE — Telephone Encounter (Signed)
   Primary Cardiologist: [name], MD  Chart reviewed as part of pre-operative protocol coverage. Patient was contacted 04/30/2019 in reference to pre-operative risk assessment for pending surgery as outlined below.  Ashley [name] was last seen by Dr. [name] on 11/12/2018.  Since that day, Ashley [name] has done relatively well from a cardiovascular standpoint.  Has chronic complaints of fatigue, noted by Dr. [name] which has been stable since last seen.  She has no complaints of chest pain or palpitations.  Denies shortness of breath.  At baseline can perform greater than 4 METS of activity without symptoms.  Had a repeat echocardiogram 02/2019 with stable cardiomyopathy.  Given no prior history of obstructive CAD or stent placement, it would be acceptable to hold ASA therapy 5 to 7 days prior to procedure then resume once okay per procedural team.  Therefore, based on ACC/AHA guidelines, the patient would be at acceptable risk for the planned procedure without further cardiovascular testing.   I will route this recommendation to the requesting party via Epic fax function and remove from pre-op pool.  Please call with questions.  Kathyrn Drown, NP 04/30/2019, 12:45 PM

## 2019-04-30 NOTE — Telephone Encounter (Signed)
LVM 12/16 

## 2019-04-30 NOTE — Telephone Encounter (Signed)
Left message for the pt to call so that we may advise how long to hold he ASA for her procedure. I will forward clearance notes to Dr. Gladstone Lighter. I will remove from the pre op call back pool.

## 2019-05-02 ENCOUNTER — Other Ambulatory Visit: Payer: Self-pay

## 2019-05-02 MED ORDER — BENZONATATE 200 MG PO CAPS: 200.0000 mg | ORAL_CAPSULE | Freq: Three times a day (TID) | ORAL | 0 refills | Status: DC | PRN

## 2019-05-02 NOTE — Telephone Encounter (Signed)
Benzonatate Last rx:  10/19/18, #20 Last OV:  02/27/19, 3 mo f/u Next OV:  06/25/19, 4 mo f/u

## 2019-05-06 ENCOUNTER — Ambulatory Visit
Admission: RE | Admit: 2019-05-06 | Discharge: 2019-05-06 | Disposition: A | Discharge status: Home / Self Care | Payer: Medicare Other | Source: Ambulatory Visit | Attending: Orthopedic Surgery | Admitting: Orthopedic Surgery

## 2019-05-06 ENCOUNTER — Other Ambulatory Visit: Payer: Self-pay

## 2019-05-06 DIAGNOSIS — M545 Low back pain, unspecified: Secondary | ICD-10-CM

## 2019-05-06 DIAGNOSIS — G8929 Other chronic pain: Secondary | ICD-10-CM

## 2019-05-06 MED ORDER — IOPAMIDOL (ISOVUE-M 200) INJECTION 41%
20.0000 mL | Freq: Once | INTRAMUSCULAR | Status: AC
  Administered 2019-05-06: 20 mL via INTRATHECAL

## 2019-05-06 MED ORDER — DIAZEPAM 5 MG PO TABS
5.0000 mg | ORAL_TABLET | Freq: Once | ORAL | Status: AC
  Administered 2019-05-06: 5 mg via ORAL

## 2019-05-06 NOTE — Discharge Instructions (Signed)

## 2019-05-07 ENCOUNTER — Encounter: Payer: Self-pay | Admitting: Family Medicine

## 2019-05-08 ENCOUNTER — Other Ambulatory Visit: Payer: Self-pay | Admitting: Family Medicine

## 2019-05-12 ENCOUNTER — Ambulatory Visit (INDEPENDENT_AMBULATORY_CARE_PROVIDER_SITE_OTHER): Payer: Medicare Other | Admitting: *Deleted

## 2019-05-12 DIAGNOSIS — I5022 Chronic systolic (congestive) heart failure: Secondary | ICD-10-CM

## 2019-05-12 LAB — CUP PACEART REMOTE DEVICE CHECK
Battery Remaining Longevity: 90 mo
Battery Voltage: 3.01 V
Brady Statistic AP VP Percent: 0.05 %
Brady Statistic AP VS Percent: 0.01 %
Brady Statistic AS VP Percent: 99.64 %
Brady Statistic AS VS Percent: 0.3 %
Brady Statistic RA Percent Paced: 0.06 %
Brady Statistic RV Percent Paced: 99.39 %
Date Time Interrogation Session: 20201228033325
HighPow Impedance: 76 Ohm
Implantable Lead Implant Date: 20130301
Implantable Lead Implant Date: 20200622
Implantable Lead Implant Date: 20200622
Implantable Lead Location: 753859
Implantable Lead Location: 753860
Implantable Lead Location: 753860
Implantable Lead Model: 3830
Implantable Lead Model: 5076
Implantable Lead Model: 6935
Implantable Pulse Generator Implant Date: 20200622
Lead Channel Impedance Value: 304 Ohm
Lead Channel Impedance Value: 342 Ohm
Lead Channel Impedance Value: 399 Ohm
Lead Channel Impedance Value: 456 Ohm
Lead Channel Impedance Value: 494 Ohm
Lead Channel Impedance Value: 532 Ohm
Lead Channel Pacing Threshold Amplitude: 0.375 V
Lead Channel Pacing Threshold Amplitude: 0.625 V
Lead Channel Pacing Threshold Amplitude: 0.75 V
Lead Channel Pacing Threshold Pulse Width: 0.4 ms
Lead Channel Pacing Threshold Pulse Width: 0.4 ms
Lead Channel Pacing Threshold Pulse Width: 0.4 ms
Lead Channel Sensing Intrinsic Amplitude: 11.625 mV
Lead Channel Sensing Intrinsic Amplitude: 11.625 mV
Lead Channel Sensing Intrinsic Amplitude: 2.5 mV
Lead Channel Sensing Intrinsic Amplitude: 2.5 mV
Lead Channel Setting Pacing Amplitude: 1 V
Lead Channel Setting Pacing Amplitude: 1.5 V
Lead Channel Setting Pacing Amplitude: 2.5 V
Lead Channel Setting Pacing Pulse Width: 0.4 ms
Lead Channel Setting Pacing Pulse Width: 0.4 ms
Lead Channel Setting Sensing Sensitivity: 0.3 mV

## 2019-05-14 ENCOUNTER — Other Ambulatory Visit: Payer: Self-pay

## 2019-05-14 ENCOUNTER — Ambulatory Visit
Admission: RE | Admit: 2019-05-14 | Discharge: 2019-05-14 | Disposition: A | Discharge status: Home / Self Care | Payer: Medicare Other | Source: Ambulatory Visit | Attending: Neurosurgery | Admitting: Neurosurgery

## 2019-05-14 DIAGNOSIS — M4302 Spondylolysis, cervical region: Secondary | ICD-10-CM | POA: Insufficient documentation

## 2019-05-22 ENCOUNTER — Other Ambulatory Visit: Payer: Self-pay | Admitting: Student

## 2019-05-22 DIAGNOSIS — M47812 Spondylosis without myelopathy or radiculopathy, cervical region: Secondary | ICD-10-CM

## 2019-05-22 DIAGNOSIS — M4302 Spondylolysis, cervical region: Secondary | ICD-10-CM | POA: Diagnosis not present

## 2019-05-22 DIAGNOSIS — Z6828 Body mass index (BMI) 28.0-28.9, adult: Secondary | ICD-10-CM | POA: Diagnosis not present

## 2019-05-22 LAB — CUP PACEART INCLINIC DEVICE CHECK
Date Time Interrogation Session: 20200924164322
Implantable Lead Implant Date: 20130301
Implantable Lead Implant Date: 20200622
Implantable Lead Implant Date: 20200622
Implantable Lead Location: 753859
Implantable Lead Location: 753860
Implantable Lead Location: 753860
Implantable Lead Model: 3830
Implantable Lead Model: 5076
Implantable Lead Model: 6935
Implantable Pulse Generator Implant Date: 20200622

## 2019-05-28 ENCOUNTER — Other Ambulatory Visit: Payer: Medicare Other

## 2019-05-29 ENCOUNTER — Other Ambulatory Visit: Payer: Medicare Other

## 2019-05-29 ENCOUNTER — Ambulatory Visit: Payer: Medicare Other

## 2019-06-04 ENCOUNTER — Ambulatory Visit
Admission: RE | Admit: 2019-06-04 | Discharge: 2019-06-04 | Disposition: A | Discharge status: Home / Self Care | Payer: Medicare Other | Source: Ambulatory Visit | Attending: Student | Admitting: Student

## 2019-06-04 ENCOUNTER — Other Ambulatory Visit: Payer: Self-pay | Admitting: Student

## 2019-06-04 ENCOUNTER — Other Ambulatory Visit: Payer: Self-pay

## 2019-06-04 DIAGNOSIS — M47812 Spondylosis without myelopathy or radiculopathy, cervical region: Secondary | ICD-10-CM

## 2019-06-04 DIAGNOSIS — M4692 Unspecified inflammatory spondylopathy, cervical region: Secondary | ICD-10-CM | POA: Diagnosis not present

## 2019-06-04 DIAGNOSIS — M542 Cervicalgia: Secondary | ICD-10-CM | POA: Diagnosis not present

## 2019-06-04 MED ORDER — IOPAMIDOL (ISOVUE-M 300) INJECTION 61%
1.0000 mL | Freq: Once | INTRAMUSCULAR | Status: AC | PRN
  Administered 2019-06-04: 1 mL via INTRA_ARTICULAR

## 2019-06-04 MED ORDER — DEXAMETHASONE SODIUM PHOSPHATE 4 MG/ML IJ SOLN
5.0000 mg | Freq: Once | INTRAMUSCULAR | Status: AC
  Administered 2019-06-04: 5.2 mg via INTRA_ARTICULAR

## 2019-06-04 NOTE — Discharge Instructions (Signed)

## 2019-06-07 ENCOUNTER — Other Ambulatory Visit: Payer: Self-pay | Admitting: Family Medicine

## 2019-06-07 ENCOUNTER — Other Ambulatory Visit: Payer: Self-pay | Admitting: Cardiovascular Disease

## 2019-06-19 DIAGNOSIS — M4302 Spondylolysis, cervical region: Secondary | ICD-10-CM | POA: Diagnosis not present

## 2019-06-25 ENCOUNTER — Ambulatory Visit (INDEPENDENT_AMBULATORY_CARE_PROVIDER_SITE_OTHER): Payer: Medicare Other | Admitting: Family Medicine

## 2019-06-25 ENCOUNTER — Encounter: Payer: Self-pay | Admitting: Family Medicine

## 2019-06-25 ENCOUNTER — Other Ambulatory Visit: Payer: Self-pay

## 2019-06-25 VITALS — BP 122/70 | HR 87 | Temp 97.6°F | Ht 65.0 in | Wt 176.5 lb

## 2019-06-25 DIAGNOSIS — I5022 Chronic systolic (congestive) heart failure: Secondary | ICD-10-CM

## 2019-06-25 DIAGNOSIS — I420 Dilated cardiomyopathy: Secondary | ICD-10-CM | POA: Diagnosis not present

## 2019-06-25 DIAGNOSIS — Z9189 Other specified personal risk factors, not elsewhere classified: Secondary | ICD-10-CM

## 2019-06-25 DIAGNOSIS — K582 Mixed irritable bowel syndrome: Secondary | ICD-10-CM | POA: Diagnosis not present

## 2019-06-25 DIAGNOSIS — T887XXS Unspecified adverse effect of drug or medicament, sequela: Secondary | ICD-10-CM | POA: Diagnosis not present

## 2019-06-25 DIAGNOSIS — J309 Allergic rhinitis, unspecified: Secondary | ICD-10-CM

## 2019-06-25 DIAGNOSIS — Z889 Allergy status to unspecified drugs, medicaments and biological substances status: Secondary | ICD-10-CM

## 2019-06-25 DIAGNOSIS — M542 Cervicalgia: Secondary | ICD-10-CM | POA: Diagnosis not present

## 2019-06-25 DIAGNOSIS — J418 Mixed simple and mucopurulent chronic bronchitis: Secondary | ICD-10-CM

## 2019-06-25 DIAGNOSIS — J329 Chronic sinusitis, unspecified: Secondary | ICD-10-CM | POA: Diagnosis not present

## 2019-06-25 DIAGNOSIS — T50A95S Adverse effect of other bacterial vaccines, sequela: Secondary | ICD-10-CM | POA: Diagnosis not present

## 2019-06-25 DIAGNOSIS — N1831 Chronic kidney disease, stage 3a: Secondary | ICD-10-CM | POA: Diagnosis not present

## 2019-06-25 MED ORDER — BENZONATATE 200 MG PO CAPS: 200.0000 mg | ORAL_CAPSULE | Freq: Three times a day (TID) | ORAL | 3 refills | Status: DC | PRN

## 2019-06-25 MED ORDER — TRIAMCINOLONE ACETONIDE 55 MCG/ACT NA AERO: 2.0000 | INHALATION_SPRAY | Freq: Every day | NASAL | Status: DC

## 2019-06-25 NOTE — Progress Notes (Signed)
This visit was conducted in person.  BP 122/70 (BP Location: Left Arm, Patient Position: Sitting, Cuff Size: Normal)   Pulse 87   Temp 97.6 F (36.4 C) (Temporal)   Ht 5\' 5"  (1.651 m)   Wt 176 lb 8 oz (80.1 kg)   SpO2 98%   BMI 29.37 kg/m    CC: 4 mo f/u visit Subjective:    Patient ID: Ashley [name], female    DOB: May 05, 1946, 74 y.o.   MRN: [id number]  HPI: Ashley [name] is a 74 y.o. female presenting on 06/25/2019 for Follow-up (Here for 4 mo f/u.) and Form Completion (Pt provided form from [place] to receive home BP monitor. )   Brings form to request BP monitor from [place].   Just recently changed most floors to hardwood. Hopeful to help with allergies.   Having cervical arthritis flare s/p cervical ESI 2-3 wks ago. Significant pain due to this. Planning to consult with [facility] neurosurgery to discuss nerve ablation. Had lumbar myelogram 04/2019 - in chart.   Chronic systolic CHF - followed by cards and EP s/p CRT implant. Upcoming appt with Dr [name].   Established with pulm - thought dyspnea related to heart with minimal obstructive lung disease.   She has stopped diet sodas with improved abdominal bloating. She continues sweet tea.  Ongoing constipation not relieved with stool softeners or miralax.   Covid vaccine - has decided not to get due to bad reaction after pneumonia shot. Husband will get covid vaccine this week.      Relevant past medical, surgical, family and social history reviewed and updated as indicated. Interim medical history since our last visit reviewed. Allergies and medications reviewed and updated. Outpatient Medications Prior to Visit  Medication Sig Dispense Refill  . acetaminophen (TYLENOL) 500 MG tablet Take 500 mg by mouth daily as needed for moderate pain or headache (PAIN).     . AMBULATORY NON FORMULARY MEDICATION Medication Name:  1 squatty potty to use as needed 1 each 0  . Ascorbic Acid (VITAMIN C) 1000 MG tablet Take 1,000 mg by  mouth See admin instructions. Take 1000 mg daily, may take an additional 1000 mg as needed for cold symptoms    . aspirin EC 81 MG tablet Take 81 mg by mouth at bedtime.     . beclomethasone (QVAR REDIHALER) 80 MCG/ACT inhaler Inhale 2 puffs into the lungs 2 (two) times daily. Rinse mouth after use. 1 g 0  . BIOTIN PO Take 1 tablet by mouth daily.    . cholecalciferol (VITAMIN D) 1000 units tablet Take 1,000 Units by mouth daily.     . Coenzyme Q10 (COQ-10) 100 MG CAPS Take 100 mg by mouth daily.    Marland Kitchen dextromethorphan (DELSYM) 30 MG/5ML liquid Take 15 mg by mouth daily as needed for cough.     . Diphenhyd-Hydrocort-Nystatin (FIRST-DUKES MOUTHWASH) SUSP Swish and swallow 5 mLs every 6 (six) hours as needed (mouth blisters). 237 mL 0  . ezetimibe (ZETIA) 10 MG tablet TAKE 1 TABLET (10 MG TOTAL) BY MOUTH DAILY. 90 tablet 2  . GARLIC PO Take 1 capsule by mouth daily.    Marland Kitchen guaiFENesin (MUCINEX) 600 MG 12 hr tablet Take 600 mg by mouth daily.    Marland Kitchen levalbuterol (XOPENEX HFA) 45 MCG/ACT inhaler Inhale 2 puffs into the lungs every 4 (four) hours as needed for wheezing or shortness of breath. 1 Inhaler 3  . LORazepam (ATIVAN) 0.5 MG tablet Take 0.5-1 tablets (0.25-0.5 mg  total) by mouth at bedtime as needed for anxiety or sleep. 30 tablet 1  . miconazole (MICOTIN) 2 % powder Apply 1 application topically as needed (yeast).    . nystatin cream (MYCOSTATIN) Apply 1 application topically as needed for dry skin (yeast infection).    . Omega-3 Fatty Acids (FISH OIL PO) Take 1 capsule by mouth daily.    . potassium chloride (K-DUR) 10 MEQ tablet Take 10 mEq by mouth daily as needed (with laxis).     Marland Kitchen pyridOXINE (VITAMIN B-6) 100 MG tablet Take 100 mg by mouth daily.    . Red Yeast Rice Extract (RED YEAST RICE PO) Take 1 capsule by mouth daily.    . sacubitril-valsartan (ENTRESTO) 24-26 MG Take 1 tablet by mouth daily. 30 tablet 11  . torsemide (DEMADEX) 10 MG tablet TAKE 1 TABLET BY MOUTH DAILY AS NEEDED FOR  SWELLING 90 tablet 0  . vitamin E 400 UNIT capsule Take 400 Units by mouth daily.    . benzonatate (TESSALON) 200 MG capsule Take 1 capsule (200 mg total) by mouth 3 (three) times daily as needed for cough. 20 capsule 0  . carvedilol (COREG) 3.125 MG tablet Take 1 tablet (3.125 mg total) by mouth 2 (two) times daily with a meal for 30 days. 60 tablet 0  . spironolactone (ALDACTONE) 25 MG tablet Take 0.5 tablets (12.5 mg total) by mouth daily for 30 days. 15 tablet 0  . torsemide (DEMADEX) 20 MG tablet Take 1 tablet (20 mg total) by mouth 2 (two) times daily. Once daily until you achieve dry weight of 167 pounds. 180 tablet 3   No facility-administered medications prior to visit.     Per HPI unless specifically indicated in ROS section below Review of Systems Objective:    BP 122/70 (BP Location: Left Arm, Patient Position: Sitting, Cuff Size: Normal)   Pulse 87   Temp 97.6 F (36.4 C) (Temporal)   Ht 5\' 5"  (1.651 m)   Wt 176 lb 8 oz (80.1 kg)   SpO2 98%   BMI 29.37 kg/m   Wt Readings from Last 3 Encounters:  06/25/19 176 lb 8 oz (80.1 kg)  03/21/19 175 lb (79.4 kg)  02/27/19 172 lb (78 kg)    Physical Exam Vitals and nursing note reviewed.  Constitutional:      Appearance: Normal appearance. She is not ill-appearing.  HENT:     Mouth/Throat:     Mouth: Mucous membranes are moist.     Pharynx: Oropharynx is clear. No oropharyngeal exudate or posterior oropharyngeal erythema.  Eyes:     Pupils: Pupils are equal, round, and reactive to light.  Cardiovascular:     Rate and Rhythm: Normal rate and regular rhythm.     Pulses: Normal pulses.     Heart sounds: Normal heart sounds. No murmur.  Pulmonary:     Effort: Pulmonary effort is normal. No respiratory distress.     Breath sounds: Normal breath sounds. No wheezing, rhonchi or rales.  Musculoskeletal:     Right lower leg: No edema.     Left lower leg: No edema.  Skin:    General: Skin is warm.     Findings: No erythema  or rash.  Neurological:     Mental Status: She is alert.  Psychiatric:        Mood and Affect: Mood normal.        Behavior: Behavior normal.       Lab Results  Component Value Date  CHOL 217 (H) 03/18/2019   HDL 46.60 03/18/2019   LDLCALC 131 (H) 03/18/2019   LDLDIRECT 164.0 11/27/2018   TRIG 199.0 (H) 03/18/2019   CHOLHDL 5 03/18/2019    Lab Results  Component Value Date   CREATININE 1.04 06/25/2019   BUN 26 (H) 06/25/2019   NA 141 06/25/2019   K 4.0 06/25/2019   CL 104 06/25/2019   CO2 29 06/25/2019    Lab Results  Component Value Date   HGBA1C 5.9 11/27/2018   Assessment & Plan:  This visit occurred during the SARS-CoV-2 public health emergency.  Safety protocols were in place, including screening questions prior to the visit, additional usage of staff PPE, and extensive cleaning of exam room while observing appropriate contact time as indicated for disinfecting solutions.   Problem List Items Addressed This Visit    Irritable bowel syndrome    Stopping artifical sweeteners has helped abdominal bloating.       Hypersensitivity to pneumococcal vaccine    Will avoid covid vaccines.       H/O multiple allergies    Has decided not to get covid vaccine.       CKD (chronic kidney disease) stage 3, GFR 30-59 ml/min (HCC) - Primary    Update renal panel on regular torsemide.       Relevant Orders   Renal function panel (Completed)   Chronic systolic CHF (congestive heart failure) ([place])    Form filled out for home BP cuff through her insurance Appreciate cardiology care       Chronic recurrent sinusitis    Will monitor for improvement with home flooring changes       Relevant Medications   benzonatate (TESSALON) 200 MG capsule   triamcinolone (NASACORT) 55 MCG/ACT AERO nasal inhaler   Chronic bronchitis ([place])    Established with pulm Dr [name] - chronic dsypnea largely stemming from cardiac conditions.       Cervical pain (neck)    Ongoing pain, ESI  so far have not helped. Appreciate PM&R and ortho care.  Considering nerve ablation and neurosurgical eval.       Cardiomyopathy, dilated, nonischemic (HCC)   Allergic rhinitis    Will monitor for improvement with home flooring changes           Meds ordered this encounter  Medications  . benzonatate (TESSALON) 200 MG capsule    Sig: Take 1 capsule (200 mg total) by mouth 3 (three) times daily as needed for cough.    Dispense:  30 capsule    Refill:  3  . triamcinolone (NASACORT) 55 MCG/ACT AERO nasal inhaler    Sig: Place 2 sprays into the nose daily.   Orders Placed This Encounter  Procedures  . Renal function panel    Patient Instructions  Good to see you today Labs today.  Change nasal steroid to just take at night time. May continue nasal saline as many times as needed. Hopefully this will help with the nosebleeds.  Return as needed or after 11/27/2019 for next wellness visit.    Follow up plan: Return in about 5 months (around 11/22/2019) for annual exam, prior fasting for blood work.  [name], MD

## 2019-06-25 NOTE — Patient Instructions (Addendum)
Good to see you today Labs today.  Change nasal steroid to just take at night time. May continue nasal saline as many times as needed. Hopefully this will help with the nosebleeds.  Return as needed or after 11/27/2019 for next wellness visit.

## 2019-06-26 ENCOUNTER — Telehealth: Payer: Self-pay | Admitting: Radiology

## 2019-06-26 LAB — RENAL FUNCTION PANEL
Albumin: 4.2 g/dL (ref 3.5–5.2)
BUN: 26 mg/dL — ABNORMAL HIGH (ref 6–23)
CO2: 29 mEq/L (ref 19–32)
Calcium: 9.7 mg/dL (ref 8.4–10.5)
Chloride: 104 mEq/L (ref 96–112)
Creatinine, Ser: 1.04 mg/dL (ref 0.40–1.20)
GFR: 51.81 mL/min — ABNORMAL LOW (ref >60.00)
Glucose, Bld: 99 mg/dL (ref 70–99)
Phosphorus: 3.8 mg/dL (ref 2.3–4.6)
Potassium: 4 mEq/L (ref 3.5–5.1)
Sodium: 141 mEq/L (ref 135–145)

## 2019-06-26 NOTE — Telephone Encounter (Signed)
[facility] called and said they need another blood sample for her CBC. Not a good sample. LVM on the patients phone to come in between [phone]

## 2019-06-27 ENCOUNTER — Other Ambulatory Visit: Payer: Self-pay

## 2019-06-27 ENCOUNTER — Other Ambulatory Visit (INDEPENDENT_AMBULATORY_CARE_PROVIDER_SITE_OTHER): Payer: Medicare Other

## 2019-06-27 DIAGNOSIS — N1831 Chronic kidney disease, stage 3a: Secondary | ICD-10-CM | POA: Diagnosis not present

## 2019-06-27 LAB — CBC WITH DIFFERENTIAL/PLATELET
Basophils Absolute: 0.1 10*3/uL (ref 0.0–0.1)
Basophils Relative: 1.3 % (ref 0.0–3.0)
Eosinophils Absolute: 0.2 10*3/uL (ref 0.0–0.7)
Eosinophils Relative: 2.5 % (ref 0.0–5.0)
HCT: 38.8 % (ref 36.0–46.0)
Hemoglobin: 12.7 g/dL (ref 12.0–15.0)
Lymphocytes Relative: 21.1 % (ref 12.0–46.0)
Lymphs Abs: 1.8 10*3/uL (ref 0.7–4.0)
MCHC: 32.7 g/dL (ref 30.0–36.0)
MCV: 90.7 fl (ref 78.0–100.0)
Monocytes Absolute: 0.8 10*3/uL (ref 0.1–1.0)
Monocytes Relative: 9.7 % (ref 3.0–12.0)
Neutro Abs: 5.5 10*3/uL (ref 1.4–7.7)
Neutrophils Relative %: 65.4 % (ref 43.0–77.0)
Platelets: 203 10*3/uL (ref 150.0–400.0)
RBC: 4.28 Mil/uL (ref 3.87–5.11)
RDW: 13 % (ref 11.5–15.5)
WBC: 8.4 10*3/uL (ref 4.0–10.5)

## 2019-06-28 NOTE — Assessment & Plan Note (Signed)
Update renal panel on regular torsemide.

## 2019-06-28 NOTE — Assessment & Plan Note (Signed)
Established with pulm Dr [name] - chronic dsypnea largely stemming from cardiac conditions.

## 2019-06-28 NOTE — Assessment & Plan Note (Addendum)
Form filled out for home BP cuff through her insurance Appreciate cardiology care

## 2019-06-28 NOTE — Assessment & Plan Note (Addendum)
Ongoing pain, ESI so far have not helped. Appreciate PM&R and ortho care.  Considering nerve ablation and neurosurgical eval.

## 2019-06-28 NOTE — Assessment & Plan Note (Signed)
Stopping artifical sweeteners has helped abdominal bloating.

## 2019-06-28 NOTE — Assessment & Plan Note (Signed)
Has decided not to get covid vaccine.

## 2019-06-28 NOTE — Assessment & Plan Note (Signed)
Will avoid covid vaccines.

## 2019-06-28 NOTE — Assessment & Plan Note (Signed)
Will monitor for improvement with home flooring changes

## 2019-07-07 DIAGNOSIS — M4302 Spondylolysis, cervical region: Secondary | ICD-10-CM | POA: Diagnosis not present

## 2019-07-23 DIAGNOSIS — M47812 Spondylosis without myelopathy or radiculopathy, cervical region: Secondary | ICD-10-CM | POA: Diagnosis not present
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
## 2019-07-28 ENCOUNTER — Other Ambulatory Visit: Payer: Self-pay

## 2019-07-28 ENCOUNTER — Ambulatory Visit (INDEPENDENT_AMBULATORY_CARE_PROVIDER_SITE_OTHER): Payer: Medicare Other | Admitting: Cardiovascular Disease

## 2019-07-28 ENCOUNTER — Encounter: Payer: Self-pay | Admitting: Cardiovascular Disease

## 2019-07-28 VITALS — BP 130/60 | HR 78 | Ht 65.0 in | Wt 178.5 lb

## 2019-07-28 DIAGNOSIS — I10 Essential (primary) hypertension: Secondary | ICD-10-CM | POA: Diagnosis not present

## 2019-07-28 DIAGNOSIS — I420 Dilated cardiomyopathy: Secondary | ICD-10-CM

## 2019-07-28 DIAGNOSIS — E782 Mixed hyperlipidemia: Secondary | ICD-10-CM

## 2019-07-28 DIAGNOSIS — Z95810 Presence of automatic (implantable) cardiac defibrillator: Secondary | ICD-10-CM | POA: Diagnosis not present

## 2019-07-28 DIAGNOSIS — I051 Rheumatic mitral insufficiency: Secondary | ICD-10-CM | POA: Diagnosis not present

## 2019-07-28 DIAGNOSIS — I5022 Chronic systolic (congestive) heart failure: Secondary | ICD-10-CM

## 2019-07-28 MED ORDER — TORSEMIDE 10 MG PO TABS: 10.0000 mg | ORAL_TABLET | Freq: Every day | ORAL | 3 refills | Status: DC

## 2019-07-28 MED ORDER — POTASSIUM CHLORIDE ER 10 MEQ PO TBCR: 10.0000 meq | EXTENDED_RELEASE_TABLET | Freq: Every day | ORAL | 3 refills | Status: DC | PRN

## 2019-07-28 MED ORDER — EZETIMIBE 10 MG PO TABS: 10.0000 mg | ORAL_TABLET | Freq: Every day | ORAL | 3 refills | Status: DC

## 2019-07-28 MED ORDER — CARVEDILOL 3.125 MG PO TABS: 3.1250 mg | ORAL_TABLET | Freq: Two times a day (BID) | ORAL | 3 refills | Status: DC

## 2019-07-28 NOTE — Patient Instructions (Addendum)
Medication Instructions:  Please increase entresto one pill twice a day  If you need a refill on your cardiac medications before your next appointment, please call your pharmacy.    Lab work: No new labs needed   If you have labs (blood work) drawn today and your tests are completely normal, you will receive your results only by: Marland Kitchen MyChart Message (if you have MyChart) OR . A paper copy in the mail If you have any lab test that is abnormal or we need to change your treatment, we will call you to review the results.   Testing/Procedures: Your physician has requested that you have an echocardiogram. Echocardiography is a painless test that uses sound waves to create images of your heart. It provides your doctor with information about the size and shape of your heart and how well your heart's chambers and valves are working. This procedure takes approximately one hour. There are no restrictions for this procedure.    Follow-Up: At [facility], you and your health needs are our priority.  As part of our continuing mission to provide you with exceptional heart care, we have created designated Provider Care Teams.  These Care Teams include your primary Cardiologist (physician) and Advanced Practice Providers (APPs -  Physician Assistants and Nurse Practitioners) who all work together to provide you with the care you need, when you need it.  . You will need a follow up appointment in 6 months .  Marland Kitchen Providers on your designated Care Team:   . Murray Hodgkins, NP . Christell Faith, PA-C . Marrianne Mood, PA-C  Any Other Special Instructions Will Be Listed Below (If Applicable).  For educational health videos Log in to : [email or web address] Or : [email or web address], password : [email or web address]

## 2019-08-07 ENCOUNTER — Ambulatory Visit (INDEPENDENT_AMBULATORY_CARE_PROVIDER_SITE_OTHER): Payer: Medicare Other | Admitting: Family Medicine

## 2019-08-07 ENCOUNTER — Other Ambulatory Visit: Payer: Self-pay

## 2019-08-07 ENCOUNTER — Encounter: Payer: Self-pay | Admitting: Physician Assistant

## 2019-08-07 ENCOUNTER — Telehealth: Payer: Self-pay | Admitting: Cardiovascular Disease

## 2019-08-07 VITALS — BP 110/60 | HR 86 | Ht 65.0 in | Wt 177.0 lb

## 2019-08-07 DIAGNOSIS — Z95810 Presence of automatic (implantable) cardiac defibrillator: Secondary | ICD-10-CM | POA: Diagnosis not present

## 2019-08-07 DIAGNOSIS — E782 Mixed hyperlipidemia: Secondary | ICD-10-CM | POA: Diagnosis not present

## 2019-08-07 DIAGNOSIS — I10 Essential (primary) hypertension: Secondary | ICD-10-CM | POA: Diagnosis not present

## 2019-08-07 DIAGNOSIS — I420 Dilated cardiomyopathy: Secondary | ICD-10-CM | POA: Diagnosis not present

## 2019-08-07 DIAGNOSIS — I5022 Chronic systolic (congestive) heart failure: Secondary | ICD-10-CM | POA: Diagnosis not present

## 2019-08-07 MED ORDER — ENTRESTO 24-26 MG PO TABS: 1.0000 | ORAL_TABLET | Freq: Two times a day (BID) | ORAL | 11 refills | Status: DC

## 2019-08-07 NOTE — Progress Notes (Signed)
Cardiology Office Note  Date: 08/07/2019   ID: Ashley [name], DOB 1945/12/16, MRN [id number]  PCP:  [name], MD  Cardiologist:  [name], MD Electrophysiologist:  Cristopher Peru, MD   Chief Complaint: Follow-up ICM, CHF, AICD, HTN,  Dyspnea ,CP radiation to back.  Patient had increased her Entresto dosage from once a day to twice a day at last visit.  History of Present Illness: Ashley [name] is a 74 y.o. female with a history of an ICM with AICD upgrade by Dr. Lovena Le, fibromyalgia, IBS, pneumonia, GERD, abnormal PFTs, previous history of shortness of breath, LBBB.  Other history includes; abdominal aortic atherosclerosis, anemia, CHF, COPD, depression, dyspnea, HTN, fibromyalgia, GERD, hemorrhoids, history of multiple allergies, or IBS,  laryngeal nodule, history of pneumonia, fall with rib fractures, osteoarthritis, mitral regurgitation, migraine without aura, sigmoid diverticulitis with perforation, abscess and fistula, sleep apnea, chronic sinusitis, cancer, bronchiectasis.  ECHO: 02/2019: EF 30 to 35% Up from 15 to 20%, earlier in the year in the setting of bronchitis.   Last visit with Dr. [name] on 07/28/2019.  She reported some "down days", rib pain on the right, radiating through to her right thoracic spine posteriorly, not sleeping well which was a chronic issue.  Described being "super tired" and napping during the day.  Not doing regular exercise, having right shoulder pain with cortisone injection,  waxing and waning constipation with diarrhea.  Dr. [name] recommended increasing her [name] dosage in the evening as she was only taking this once a day.  Patient was going to try to increase the dose up to twice daily dosing.  He mentioned proceeding with adding extra Entresto cautiously to avoid hypotension secondary to patient's history of syncope.  He ordered continuation of Coreg and spironolactone.  He ordered a follow-up echo which has not been  performed yet.  Patient states he recently received a cortisone injection from a pain management provider.  She states afterwards she started feeling some nausea, choking sensation, shortness of breath, after steroid injection, but no presyncopal or syncopal episodes.  She was concerned this may be the Entresto medication causing the symptoms.  She started increasing her Entresto dose to a full pill in the a.m. and 1/2 pill in the p.m.10 days ago after her visit with Dr. [name].  States she has been tolerating it well thus far.  She was questioning whether the cortisone injections or the Entresto may have caused these symptoms.  Blood pressure is 110/60 today.  She denies any orthostatic symptoms.   Past Medical History:  Diagnosis Date  . Abdominal aortic atherosclerosis ([place]) 11/2015   by CT  . Allergy   . Anemia   . Anxiety   . Automatic implantable cardioverter-defibrillator in situ 2012   a. MDT single lead ICD.  Marland Kitchen Bronchiectasis    a. possibly mild, treat URIs aggressively  . Cancer (HCC)    basal cell, arms & neck  . CHF (congestive heart failure) ([place])   . Chronic sinusitis   . COPD (chronic obstructive pulmonary disease) ([place])   . Depression   . Dyspnea   . Essential hypertension   . Fibrocystic breast disease   . Fibromyalgia   . GERD (gastroesophageal reflux disease)   . H/O multiple allergies 10/10/2013  . Hemorrhoids   . History of diverticulitis of colon   . History of pneumonia 2012  . Insomnia   . Irritable bowel syndrome   . Laryngeal nodule    Dr. Thomasena Edis  .  Migraine without aura, without mention of intractable migraine without mention of status migrainosus   . Mitral regurgitation   . Nonischemic cardiomyopathy ([place])    a. Recovered LV fxn - prev as low as 25%, now 50-55% by echo 04/2014;  b. 2012 s/p MDT [id number] [place] VR single lead ICD.  Marland Kitchen Osteoarthritis    knees, back, hands, low back,   . Osteoporosis 01/2013   T-2.7 spine (01/2016)  . PONV (postoperative  nausea and vomiting)   . sigmoid diverticulitis with perforation, abscess and fistula 09/30/2013  . Sleep apnea 2010   borderline, inconclusive- /w [place]     Past Surgical History:  Procedure Laterality Date  . ABDOMINAL HYSTERECTOMY  1987   w/BSO  . ANAL RECTAL MANOMETRY N/A 02/02/2016   Procedure: ANO RECTAL MANOMETRY;  Surgeon: Mauri Pole, MD;  Location: [facility];  Service: Endoscopy;  Laterality: N/A;  . APPENDECTOMY    . AUGMENTATION MAMMAPLASTY    . BIV UPGRADE N/A 11/04/2018   Procedure: BIV ICD  UPGRADE;  Surgeon: Evans Lance, MD;  Location: [facility];  Service: Cardiovascular;  Laterality: N/A;  . breast cystectomies     due to FCBD  . BREAST MASS EXCISION  01/2010   Left-fibrocystic change w/intraductal papilloma, no malignancy  . carotid US  [id number]   123456 LICA, normal RICA  . [place] Bunionectomy  11/04/08   Right Great Toe (Dr. Beola Cord)  . COLONOSCOPY  01/2018   1 small benign polyp, diverticulosis, healthy anastomosis [name] @ [facility])  . COLOSTOMY N/A 10/02/2013   DESCENDING COLOSTOMY;  Surgeon: Imogene Burn. Tsuei, [place] N/A 02/10/2014   [name], MD  . EMG/MCV  10/16/01   + mild carpal tunnel  . ESOPHAGOGASTRODUODENOSCOPY  09/2014   small HH, irregular Z line, fundic gland polyps with mild gastritis ([place])  . Flex laryngoscopy  06/11/06   ([place]) nml  . IMPLANTABLE CARDIOVERTER DEFIBRILLATOR IMPLANT N/A 07/14/2011   MDT single chamber ICD  . KNEE ARTHROSCOPY W/ ORIF     Left; "meniscus tear"  . LASIK     left eye  . MANDIBLE SURGERY     "got about 6 pins in the bottom of my jaw"  . NCS  11/2014   L ulnar neuropathy, mild B median nerve entrapment ([place])  . OSTEOTOMY     Left foot  . PARTIAL COLECTOMY N/A 10/02/2013   PARTIAL COLECTOMY;  Surgeon: Imogene Burn. Tsuei, MD  . RIGHT/LEFT HEART CATH AND CORONARY ANGIOGRAPHY N/A 10/16/2018   Procedure: RIGHT/LEFT HEART CATH AND CORONARY ANGIOGRAPHY;  Surgeon: Wellington Hampshire, MD;  Location: [facility];  Service: Cardiovascular;  Laterality: N/A;  . sinus surgery     x3 with balloon   . TOE AMPUTATION     "toe beside baby toe on left foot; got gangrene from corn"  . TONSILLECTOMY AND ADENOIDECTOMY      Current Outpatient Medications  Medication Sig Dispense Refill  . acetaminophen (TYLENOL) 500 MG tablet Take 500 mg by mouth daily as needed for moderate pain or headache (PAIN).     . AMBULATORY NON FORMULARY MEDICATION Medication Name:  1 squatty potty to use as needed 1 each 0  . Ascorbic Acid (VITAMIN C) 1000 MG tablet Take 1,000 mg by mouth See admin instructions. Take 1000 mg daily, may take an additional 1000 mg as needed for cold symptoms    . aspirin EC 81 MG tablet Take 81 mg by  mouth at bedtime.     . beclomethasone (QVAR REDIHALER) 80 MCG/ACT inhaler Inhale 2 puffs into the lungs 2 (two) times daily. Rinse mouth after use. 1 g 0  . benzonatate (TESSALON) 200 MG capsule Take 1 capsule (200 mg total) by mouth 3 (three) times daily as needed for cough. (Patient taking differently: Take 200 mg by mouth 3 (three) times daily as needed for cough. ) 30 capsule 3  . BIOTIN PO Take 1 tablet by mouth daily.    . carvedilol (COREG) 3.125 MG tablet Take 1 tablet (3.125 mg total) by mouth 2 (two) times daily with a meal. 180 tablet 3  . cholecalciferol (VITAMIN D) 1000 units tablet Take 1,000 Units by mouth daily.     . Coenzyme Q10 (COQ-10) 100 MG CAPS Take 100 mg by mouth daily.    Marland Kitchen dextromethorphan (DELSYM) 30 MG/5ML liquid Take 15 mg by mouth daily as needed for cough.     . Diphenhyd-Hydrocort-Nystatin (FIRST-DUKES MOUTHWASH) SUSP Swish and swallow 5 mLs every 6 (six) hours as needed (mouth blisters). 237 mL 0  . ezetimibe (ZETIA) 10 MG tablet Take 1 tablet (10 mg total) by mouth daily. 90 tablet 3  . GARLIC PO Take 1 capsule by mouth daily.    Marland Kitchen guaiFENesin (MUCINEX) 600 MG 12 hr tablet Take 600 mg by mouth daily.    Marland Kitchen levalbuterol (XOPENEX  HFA) 45 MCG/ACT inhaler Inhale 2 puffs into the lungs every 4 (four) hours as needed for wheezing or shortness of breath. (Patient taking differently: Inhale 2 puffs into the lungs every 4 (four) hours as needed for wheezing or shortness of breath. ) 1 Inhaler 3  . LORazepam (ATIVAN) 0.5 MG tablet Take 0.5-1 tablets (0.25-0.5 mg total) by mouth at bedtime as needed for anxiety or sleep. (Patient taking differently: Take 0.25-0.5 mg by mouth at bedtime as needed for anxiety or sleep. ) 30 tablet 1  . miconazole (MICOTIN) 2 % powder Apply 1 application topically as needed (yeast).    . nystatin cream (MYCOSTATIN) Apply 1 application topically as needed for dry skin (yeast infection).     . potassium chloride (KLOR-CON) 10 MEQ tablet Take 1 tablet (10 mEq total) by mouth daily as needed (with laxis). 90 tablet 3  . pyridOXINE (VITAMIN B-6) 100 MG tablet Take 100 mg by mouth daily.    . Red Yeast Rice Extract (RED YEAST RICE PO) Take 1 capsule by mouth daily.    . sacubitril-valsartan (ENTRESTO) 24-26 MG Take 1 tablet by mouth daily. (Patient taking differently: Take 1.5 tablets by mouth daily. ) 30 tablet 11  . torsemide (DEMADEX) 10 MG tablet Take 1 tablet (10 mg total) by mouth daily. 90 tablet 3  . triamcinolone (NASACORT) 55 MCG/ACT AERO nasal inhaler Place 2 sprays into the nose daily.    . vitamin E 400 UNIT capsule Take 400 Units by mouth daily.    Marland Kitchen spironolactone (ALDACTONE) 25 MG tablet Take 0.5 tablets (12.5 mg total) by mouth daily for 30 days. 15 tablet 0   No current facility-administered medications for this visit.   Allergies:  Atorvastatin; Chlorhexidine; Codeine; Cyclobenzaprine; Cymbalta [duloxetine hcl]; Diflucan [fluconazole]; Dilaudid [hydromorphone hcl]; Furosemide; Influenza vac split [flu virus vaccine]; Latex; Morphine and related; Pravastatin; Prevacid [lansoprazole]; Prevnar [pneumococcal 13-val conj vacc]; Rosuvastatin; Simvastatin; Tape; Metaxalone; Antihistamines,  chlorpheniramine-type; Betadine [povidone iodine]; Onion; Other; Penicillins; Red dye; Sertraline; and Sulfasalazine   Social History: The patient  reports that she has never smoked. She has never used  smokeless tobacco. She reports that she does not drink alcohol or use drugs.   Family History: The patient's family history includes Alcohol abuse in her paternal grandfather; Arthritis in her mother; Colon polyps in her father; Dementia in her mother; Emphysema in her mother; Heart disease in her paternal grandfather and paternal grandmother; Hypertension in her maternal grandfather; Lung cancer in her father; Osteoporosis in her mother; Stroke in her mother and paternal grandfather.   ROS:  Please see the history of present illness. Otherwise, complete review of systems is positive for none.  All other systems are reviewed and negative.   Physical Exam: VS:  BP 110/60 (BP Location: Left Arm, Patient Position: Sitting, Cuff Size: Normal)   Pulse 86   Ht 5\' 5"  (1.651 m)   Wt 177 lb (80.3 kg)   SpO2 98%   BMI 29.45 kg/m , BMI Body mass index is 29.45 kg/m.  Wt Readings from Last 3 Encounters:  08/07/19 177 lb (80.3 kg)  07/28/19 178 lb 8 oz (81 kg)  06/25/19 176 lb 8 oz (80.1 kg)    General: Patient appears comfortable at rest. Neck: Supple, no elevated JVP or carotid bruits, no thyromegaly. Lungs: Clear to auscultation, nonlabored breathing at rest. Cardiac: Regular rate and rhythm, no S3 or significant systolic murmur, no pericardial rub. Extremities: No pitting edema, distal pulses 2+. Skin: Warm and dry. Musculoskeletal: No kyphosis. Neuropsychiatric: Alert and oriented x3, affect grossly appropriate.  ECG:  An ECG dated 08/07/2019 was personally reviewed today and demonstrated:  Atrial sensed ventricular paced rhythm with occasional premature ventricular complexes rate of 86, biventricular pacemaker detected.  Recent Labwork: 09/20/2018: ALT 21; AST 19 10/23/2018: BNP  141.2 06/25/2019: BUN 26; Creatinine, Ser 1.04; Potassium 4.0; Sodium 141 06/27/2019: Hemoglobin 12.7; Platelets 203.0     Component Value Date/Time   CHOL 217 (H) 03/18/2019 0933   TRIG 199.0 (H) 03/18/2019 0933   HDL 46.60 03/18/2019 0933   CHOLHDL 5 03/18/2019 0933   VLDL 39.8 03/18/2019 0933   LDLCALC 131 (H) 03/18/2019 0933   LDLDIRECT 164.0 11/27/2018 1407    Other Studies Reviewed Today:  Echocardiogram 02/26/2019  1. Left ventricular ejection fraction, by visual estimation, is 30 to  35%. The left ventricle has severely decreased function. There is no left  ventricular hypertrophy.  2. Severe global hypokinesis.  3. Elevated left atrial and left ventricular end-diastolic pressures.  4. Left ventricular diastolic Doppler parameters are consistent with  impaired relaxation pattern of LV diastolic filling.  5. Global right ventricle has normal systolic function.The right  ventricular size is normal. No increase in right ventricular wall  thickness.  6. The mitral valve is abnormal. Moderate mitral valve regurgitation.  7. A pacer wire is visualized.  8. This study was used by the cardiac electrophysiologist to optimize AV  pacer settings.  9. Left atrial size was mildly dilated.  10. The interatrial septum was not assessed.  11. Right atrial size was normal.  12. The aortic valve is tricuspid Aortic valve regurgitation was not  visualized by color flow Doppler. Mild aortic valve sclerosis without  stenosis.  13. The tricuspid valve is grossly normal. Tricuspid valve regurgitation  is mild.  14. The pulmonic valve was grossly normal. Pulmonic valve regurgitation is  not visualized by color flow Doppler.  15. The inferior vena cava is normal in size with greater than 50%  respiratory variability, suggesting right atrial pressure of 3 mmHg.  Lexiscan nuclear Myoview stress test October 18, 2017 Pharmacological  myocardial perfusion imaging study with no significant   Ischemia Small region of predominantly fixed apical defect, possibly secondary to attenuation artifact GI uptake artifact noted Normal wall motion, EF estimated at 52% No EKG changes concerning for ischemia at peak stress or in recovery. LBBB developed in recovery following infusion, persisted at 3 min Low risk scan    Assessment and Plan:  1. Cardiomyopathy, dilated, nonischemic ([place])   2. ICD (implantable cardioverter-defibrillator) in place   3. Chronic systolic CHF (congestive heart failure) ([place])   4. Essential hypertension    1. Cardiomyopathy, dilated, nonischemic ([place]) Recent echocardiogram on 02/26/2019 with EF 30 to 35%. The left ventricle has severely decreased function. Severe global hypokinesis.     Impaired relaxation pattern of LV diastolic filling.  Continue aspirin 81 mg  2. ICD (implantable cardioverter-defibrillator) in place Biventricular pacemaker in place.  EKG today shows atrial sensed ventricular paced rhythm with occasional PVCs.  Rate is 86.  She has a biventricular pacemaker in place by Dr. Lovena Le.  3. Chronic systolic CHF (congestive heart failure) ([place]) At last visit patient was only taking Entresto 24/26 mg p.o. 1 time per day.  Dr. [name] encouraged her to start taking the evening dose of the medication to help improve her ejection fraction.  Patient states today that she only increase the evening dose to 1/2 pill.  Encouraged her to go to a full pill for the evening dose.  Advised her this is a twice a day dosing regimen to help increase her systolic function over time.  Continue torsemide 10 mg daily, Entresto 2426 mg p.o. twice daily, spironolactone 12.5 mg daily, potassium chloride 10 mg as needed with torsemide.  Get a BMP in 1 to 2 weeks after dosage increase in Entresto.  4. Essential hypertension She is normotensive today with a blood pressure of 110/60.  Blood pressure last visit was 130/60.  Continue spironolactone 12.5 mg daily  5.   Hyperlipidemia Continue Zetia 10 mg daily.  Patient is intolerant to statins.  Mentioned the patient possibility of PCSK9 if cholesterol has not improved on Zetia.  Patient is reticent to start anything injectable.  Patient states she has had a previous injection which caused her some significant autoimmune response.  Medication Adjustments/Labs and Tests Ordered: Current medicines are reviewed at length with the patient today.  Concerns regarding medicines are outlined above.   Disposition: Follow-up with Dr. [name] or APP 2 months  Signed, Levell July, NP 08/07/2019 2:04 PM    [place]

## 2019-08-07 NOTE — Telephone Encounter (Signed)
Spoke with patient and she states that she is just "going down" She reports she is feeling worse and it has been coming and going. She feels that she needs assessment today to determine if everything is ok or go to ED for evaluation. Offered appointment with our DOD slot for tomorrow but she states that would not work due to husband having procedure. Reviewed that I had an appointment later today. She is scheduled for echo 4/22 with no openings before then. She requested to please come in for someone to listen to her heart & lungs and do an EKG. Scheduled her with APP and confirmed her date and time.

## 2019-08-07 NOTE — Telephone Encounter (Signed)
Pt c/o of Chest Pain: STAT if CP now or developed within 24 hours  1. Are you having CP right now? no  2. Are you experiencing any other symptoms (ex. SOB, nausea, vomiting, sweating)? Pain and states if patient does anything she feels worse, states Injection fraction is going down, nauseous, dizzy  3. How long have you been experiencing CP? 3rd day  4. Is your CP continuous or coming and going? Coming and going  5. Have you taken Nitroglycerin? n/a   Cortisone shot 3 weeks ago and unsure if this is a reaction. Patient wants to be seen and have her heart listened to Please advise ?

## 2019-08-07 NOTE — Patient Instructions (Signed)
Medication Instructions:  1- INCREASE Entresto to 1 tablet (24 mg/26mg  total) twice daily.  *If you need a refill on your cardiac medications before your next appointment, please call your pharmacy*   Lab Work: 1- Your physician recommends that you return for lab work in:1 weeks [facility] the [facility] mall. (BM[facility])No appt is needed. Hours are M-F 7AM- 6 PM.  If you have labs (blood work) drawn today and your tests are completely normal, you will receive your results only by: Marland Kitchen MyChart Message (if you have MyChart) OR . A paper copy in the mail If you have any lab test that is abnormal or we need to change your treatment, we will call you to review the results.   Testing/Procedures: None ordered    Follow-Up: At [facility], you and your health needs are our priority.  As part of our continuing mission to provide you with exceptional heart care, we have created designated Provider Care Teams.  These Care Teams include your primary Cardiologist (physician) and Advanced Practice Providers (APPs -  Physician Assistants and Nurse Practitioners) who all work together to provide you with the care you need, when you need it.  We recommend signing up for the patient portal called "MyChart".  Sign up information is provided on this After Visit Summary.  MyChart is used to connect with patients for Virtual Visits (Telemedicine).  Patients are able to view lab/test results, encounter notes, upcoming appointments, etc.  Non-urgent messages can be sent to your provider as well.   To learn more about what you can do with MyChart, go to [email or web address].    Your next appointment:   4 week(s)  The format for your next appointment:   In Person  Provider:    You may see [name], MD or one of the following Advanced Practice Providers on your designated Care Team:    Murray Hodgkins, NP  Christell Faith, PA-C  Marrianne Mood, PA-C

## 2019-08-11 ENCOUNTER — Telehealth: Payer: Self-pay | Admitting: Internal Medicine

## 2019-08-11 ENCOUNTER — Ambulatory Visit (INDEPENDENT_AMBULATORY_CARE_PROVIDER_SITE_OTHER): Payer: Medicare Other | Admitting: *Deleted

## 2019-08-11 ENCOUNTER — Encounter: Payer: Self-pay | Admitting: Internal Medicine

## 2019-08-11 DIAGNOSIS — I5022 Chronic systolic (congestive) heart failure: Secondary | ICD-10-CM

## 2019-08-11 NOTE — Telephone Encounter (Signed)
New Message  Patient is calling in stating that she missed a call from Dr. Tanna Furry office. If called, please give patient a call back to assist.

## 2019-08-11 NOTE — Telephone Encounter (Signed)
Left detailed message did not see where office had called but that pt was scheduled for a device transmission and this was received .Adonis Housekeeper

## 2019-08-11 NOTE — Telephone Encounter (Signed)
error 

## 2019-08-12 ENCOUNTER — Telehealth: Payer: Self-pay | Admitting: Family Medicine

## 2019-08-12 LAB — CUP PACEART REMOTE DEVICE CHECK
Battery Remaining Longevity: 89 mo
Battery Voltage: 3.01 V
Brady Statistic AP VP Percent: 0.06 %
Brady Statistic AP VS Percent: 0.01 %
Brady Statistic AS VP Percent: 99.59 %
Brady Statistic AS VS Percent: 0.35 %
Brady Statistic RA Percent Paced: 0.07 %
Brady Statistic RV Percent Paced: 99.36 %
Date Time Interrogation Session: 20210329134224
HighPow Impedance: 89 Ohm
Implantable Lead Implant Date: 20130301
Implantable Lead Implant Date: 20200622
Implantable Lead Implant Date: 20200622
Implantable Lead Location: 753859
Implantable Lead Location: 753860
Implantable Lead Location: 753860
Implantable Lead Model: 3830
Implantable Lead Model: 5076
Implantable Lead Model: 6935
Implantable Pulse Generator Implant Date: 20200622
Lead Channel Impedance Value: 266 Ohm
Lead Channel Impedance Value: 323 Ohm
Lead Channel Impedance Value: 456 Ohm
Lead Channel Impedance Value: 494 Ohm
Lead Channel Impedance Value: 532 Ohm
Lead Channel Impedance Value: 532 Ohm
Lead Channel Pacing Threshold Amplitude: 0.375 V
Lead Channel Pacing Threshold Amplitude: 0.625 V
Lead Channel Pacing Threshold Amplitude: 0.625 V
Lead Channel Pacing Threshold Pulse Width: 0.4 ms
Lead Channel Pacing Threshold Pulse Width: 0.4 ms
Lead Channel Pacing Threshold Pulse Width: 0.4 ms
Lead Channel Sensing Intrinsic Amplitude: 12.625 mV
Lead Channel Sensing Intrinsic Amplitude: 12.625 mV
Lead Channel Sensing Intrinsic Amplitude: 2.625 mV
Lead Channel Sensing Intrinsic Amplitude: 2.625 mV
Lead Channel Setting Pacing Amplitude: 1 V
Lead Channel Setting Pacing Amplitude: 1.5 V
Lead Channel Setting Pacing Amplitude: 2.5 V
Lead Channel Setting Pacing Pulse Width: 0.4 ms
Lead Channel Setting Pacing Pulse Width: 0.4 ms
Lead Channel Setting Sensing Sensitivity: 0.3 mV

## 2019-08-12 NOTE — Chronic Care Management (AMB) (Signed)
  Chronic Care Management   Note  08/12/2019 Name: Ashley [name] MRN: [id number] DOB: 1946/03/23  Ashley [name] is a 74 y.o. year old female who is a primary care patient of [name], MD. I reached out to [name] by phone today in response to a referral sent by [name]'s PCP, [name], MD.   Ashley [name] was given information about Chronic Care Management services today including:  1. CCM service includes personalized support from designated clinical staff supervised by her physician, including individualized plan of care and coordination with other care providers 2. 24/7 contact phone numbers for assistance for urgent and routine care needs. 3. Service will only be billed when office clinical staff spend 20 minutes or more in a month to coordinate care. 4. Only one practitioner may furnish and bill the service in a calendar month. 5. The patient may stop CCM services at any time (effective at the end of the month) by phone call to the office staff.   Patient agreed to services and verbal consent obtained.   Follow up plan:   [name] [employer] Scheduler

## 2019-08-12 NOTE — Progress Notes (Signed)
ICD Remote  

## 2019-08-18 ENCOUNTER — Other Ambulatory Visit
Admission: RE | Admit: 2019-08-18 | Discharge: 2019-08-18 | Disposition: A | Discharge status: Home / Self Care | Payer: Medicare Other | Source: Ambulatory Visit | Attending: Family Medicine | Admitting: Family Medicine

## 2019-08-18 DIAGNOSIS — I420 Dilated cardiomyopathy: Secondary | ICD-10-CM | POA: Diagnosis not present

## 2019-08-18 LAB — BASIC METABOLIC PANEL
Anion gap: 13 (ref 5–15)
BUN: 41 mg/dL — ABNORMAL HIGH (ref 8–23)
CO2: 22 mmol/L (ref 22–32)
Calcium: 9.8 mg/dL (ref 8.9–10.3)
Chloride: 103 mmol/L (ref 98–111)
Creatinine, Ser: 1.2 mg/dL — ABNORMAL HIGH (ref 0.44–1.00)
GFR calc Af Amer: 52 mL/min — ABNORMAL LOW (ref >60)
GFR calc non Af Amer: 44 mL/min — ABNORMAL LOW (ref >60)
Glucose, Bld: 111 mg/dL — ABNORMAL HIGH (ref 70–99)
Potassium: 4.8 mmol/L (ref 3.5–5.1)
Sodium: 138 mmol/L (ref 135–145)

## 2019-08-18 NOTE — Addendum Note (Signed)
Addended by: Verlon Au on: 08/18/2019 11:29 AM   Modules accepted: Orders

## 2019-08-19 ENCOUNTER — Telehealth: Payer: Self-pay | Admitting: *Deleted

## 2019-08-19 DIAGNOSIS — I420 Dilated cardiomyopathy: Secondary | ICD-10-CM

## 2019-08-19 MED ORDER — TORSEMIDE 10 MG PO TABS: 10.0000 mg | ORAL_TABLET | ORAL | 3 refills | Status: DC

## 2019-08-19 NOTE — Telephone Encounter (Addendum)
  Verta Ellen., NP  08/18/2019 9:34 PM EDT    Please call the patient and tell her to start taking the torsemide 10 mg dose every other day. Her renal function has decreased a little and we need to re-check renal function in 2 weeks after changing the dosage interval. Get BMET in 2 weeks unless Dr [name] orders otherwise. I have Cc'd him this note Thank You.

## 2019-08-19 NOTE — Telephone Encounter (Signed)
Spoke with patient and reviewed results and recommendations. She verbalized understanding and would go to [facility] entrance to have her labs done. Instructed her to cut back on her torsemide to 10 mg every other day. She states that she feels as if her ejection fraction has dropped significantly and reviewed that the [facility] usually really helps with that. She states that she just hurts all over feet, legs, back, and so forth. Recommended that she please review with her PCP about this pain for further evaluation. She verbalized understanding of our conversation, verbalized agreement with plan, and had no further questions.

## 2019-08-20 DIAGNOSIS — M79672 Pain in left foot: Secondary | ICD-10-CM | POA: Diagnosis not present

## 2019-08-20 DIAGNOSIS — M2042 Other hammer toe(s) (acquired), left foot: Secondary | ICD-10-CM | POA: Diagnosis not present

## 2019-08-20 DIAGNOSIS — M79671 Pain in right foot: Secondary | ICD-10-CM | POA: Diagnosis not present

## 2019-08-20 DIAGNOSIS — M7742 Metatarsalgia, left foot: Secondary | ICD-10-CM | POA: Diagnosis not present

## 2019-08-20 DIAGNOSIS — M21612 Bunion of left foot: Secondary | ICD-10-CM | POA: Diagnosis not present

## 2019-08-20 DIAGNOSIS — M7662 Achilles tendinitis, left leg: Secondary | ICD-10-CM | POA: Diagnosis not present

## 2019-08-28 DIAGNOSIS — M25579 Pain in unspecified ankle and joints of unspecified foot: Secondary | ICD-10-CM | POA: Diagnosis not present

## 2019-08-28 DIAGNOSIS — R2689 Other abnormalities of gait and mobility: Secondary | ICD-10-CM | POA: Diagnosis not present

## 2019-08-29 ENCOUNTER — Telehealth: Payer: Self-pay

## 2019-08-29 DIAGNOSIS — I5022 Chronic systolic (congestive) heart failure: Secondary | ICD-10-CM

## 2019-08-29 DIAGNOSIS — K219 Gastro-esophageal reflux disease without esophagitis: Secondary | ICD-10-CM

## 2019-08-29 NOTE — Telephone Encounter (Signed)
Per written referral from PCP, requesting referral in Epic for [name] to chronic care management pharmacy services for the following conditions:   Chronic systolic CHF (congestive heart failure) [I50.22]  GERD (gastroesophageal reflux disease) [K21.9]  [name], PharmD Clinical Pharmacist [facility] [phone]

## 2019-08-31 ENCOUNTER — Emergency Department (HOSPITAL_COMMUNITY)
Admission: EM | Admit: 2019-08-31 | Discharge: 2019-09-01 | Disposition: A | Discharge status: Home / Self Care | Payer: Medicare Other | Attending: Emergency Medicine | Admitting: Emergency Medicine

## 2019-08-31 ENCOUNTER — Encounter (HOSPITAL_COMMUNITY): Payer: Self-pay

## 2019-08-31 ENCOUNTER — Other Ambulatory Visit: Payer: Self-pay

## 2019-08-31 DIAGNOSIS — N183 Chronic kidney disease, stage 3 unspecified: Secondary | ICD-10-CM | POA: Diagnosis not present

## 2019-08-31 DIAGNOSIS — J449 Chronic obstructive pulmonary disease, unspecified: Secondary | ICD-10-CM | POA: Insufficient documentation

## 2019-08-31 DIAGNOSIS — I130 Hypertensive heart and chronic kidney disease with heart failure and stage 1 through stage 4 chronic kidney disease, or unspecified chronic kidney disease: Secondary | ICD-10-CM | POA: Insufficient documentation

## 2019-08-31 DIAGNOSIS — R197 Diarrhea, unspecified: Secondary | ICD-10-CM | POA: Insufficient documentation

## 2019-08-31 DIAGNOSIS — R112 Nausea with vomiting, unspecified: Secondary | ICD-10-CM | POA: Insufficient documentation

## 2019-08-31 DIAGNOSIS — Z91040 Latex allergy status: Secondary | ICD-10-CM | POA: Diagnosis not present

## 2019-08-31 DIAGNOSIS — E860 Dehydration: Secondary | ICD-10-CM | POA: Insufficient documentation

## 2019-08-31 DIAGNOSIS — Z7982 Long term (current) use of aspirin: Secondary | ICD-10-CM | POA: Insufficient documentation

## 2019-08-31 DIAGNOSIS — R109 Unspecified abdominal pain: Secondary | ICD-10-CM | POA: Insufficient documentation

## 2019-08-31 DIAGNOSIS — Z79899 Other long term (current) drug therapy: Secondary | ICD-10-CM | POA: Diagnosis not present

## 2019-08-31 DIAGNOSIS — Z20822 Contact with and (suspected) exposure to covid-19: Secondary | ICD-10-CM | POA: Diagnosis not present

## 2019-08-31 DIAGNOSIS — I509 Heart failure, unspecified: Secondary | ICD-10-CM | POA: Insufficient documentation

## 2019-08-31 DIAGNOSIS — R1110 Vomiting, unspecified: Secondary | ICD-10-CM | POA: Diagnosis not present

## 2019-08-31 LAB — COMPREHENSIVE METABOLIC PANEL
ALT: 27 U/L (ref 0–44)
AST: 29 U/L (ref 15–41)
Albumin: 4.1 g/dL (ref 3.5–5.0)
Alkaline Phosphatase: 91 U/L (ref 38–126)
Anion gap: 13 (ref 5–15)
BUN: 24 mg/dL — ABNORMAL HIGH (ref 8–23)
CO2: 19 mmol/L — ABNORMAL LOW (ref 22–32)
Calcium: 9.6 mg/dL (ref 8.9–10.3)
Chloride: 107 mmol/L (ref 98–111)
Creatinine, Ser: 1.12 mg/dL — ABNORMAL HIGH (ref 0.44–1.00)
GFR calc Af Amer: 56 mL/min — ABNORMAL LOW (ref >60)
GFR calc non Af Amer: 48 mL/min — ABNORMAL LOW (ref >60)
Glucose, Bld: 114 mg/dL — ABNORMAL HIGH (ref 70–99)
Potassium: 4.2 mmol/L (ref 3.5–5.1)
Sodium: 139 mmol/L (ref 135–145)
Total Bilirubin: 0.5 mg/dL (ref 0.3–1.2)
Total Protein: 8 g/dL (ref 6.5–8.1)

## 2019-08-31 LAB — LIPASE, BLOOD: Lipase: 25 U/L (ref 11–51)

## 2019-08-31 LAB — CBC
HCT: 42 % (ref 36.0–46.0)
Hemoglobin: 13.7 g/dL (ref 12.0–15.0)
MCH: 30.1 pg (ref 26.0–34.0)
MCHC: 32.6 g/dL (ref 30.0–36.0)
MCV: 92.3 fL (ref 80.0–100.0)
Platelets: 211 10*3/uL (ref 150–400)
RBC: 4.55 MIL/uL (ref 3.87–5.11)
RDW: 13.1 % (ref 11.5–15.5)
WBC: 12.9 10*3/uL — ABNORMAL HIGH (ref 4.0–10.5)
nRBC: 0 % (ref 0.0–0.2)

## 2019-08-31 MED ORDER — ONDANSETRON 4 MG PO TBDP: 4.0000 mg | ORAL_TABLET | Freq: Once | ORAL | Status: DC | PRN

## 2019-08-31 MED ORDER — SODIUM CHLORIDE 0.9% FLUSH: 3.0000 mL | Freq: Once | INTRAVENOUS | Status: DC

## 2019-08-31 NOTE — ED Triage Notes (Signed)
Pt arrives to ED w/ c/o abdominal pain and emesis. Pt states she went out to dinner yesterday and has been feeling sick ever since then.

## 2019-09-01 ENCOUNTER — Emergency Department (HOSPITAL_COMMUNITY): Payer: Medicare Other

## 2019-09-01 ENCOUNTER — Telehealth: Payer: Self-pay

## 2019-09-01 DIAGNOSIS — Z20822 Contact with and (suspected) exposure to covid-19: Secondary | ICD-10-CM | POA: Diagnosis not present

## 2019-09-01 DIAGNOSIS — R112 Nausea with vomiting, unspecified: Secondary | ICD-10-CM | POA: Diagnosis not present

## 2019-09-01 DIAGNOSIS — E860 Dehydration: Secondary | ICD-10-CM | POA: Diagnosis not present

## 2019-09-01 DIAGNOSIS — R197 Diarrhea, unspecified: Secondary | ICD-10-CM | POA: Diagnosis not present

## 2019-09-01 DIAGNOSIS — R1110 Vomiting, unspecified: Secondary | ICD-10-CM | POA: Diagnosis not present

## 2019-09-01 LAB — URINALYSIS, ROUTINE W REFLEX MICROSCOPIC
Bilirubin Urine: NEGATIVE
Glucose, UA: NEGATIVE mg/dL
Hgb urine dipstick: NEGATIVE
Ketones, ur: NEGATIVE mg/dL
Nitrite: NEGATIVE
Protein, ur: NEGATIVE mg/dL
Specific Gravity, Urine: 1.02 (ref 1.005–1.030)
pH: 5 (ref 5.0–8.0)

## 2019-09-01 LAB — RESPIRATORY PANEL BY RT PCR (FLU A&B, COVID)
Influenza A by PCR: NEGATIVE
Influenza B by PCR: NEGATIVE
SARS Coronavirus 2 by RT PCR: NEGATIVE

## 2019-09-01 MED ORDER — IOHEXOL 300 MG/ML  SOLN
100.0000 mL | Freq: Once | INTRAMUSCULAR | Status: AC | PRN
  Administered 2019-09-01: 100 mL via INTRAVENOUS

## 2019-09-01 MED ORDER — SODIUM CHLORIDE 0.9 % IV BOLUS (SEPSIS)
500.0000 mL | Freq: Once | INTRAVENOUS | Status: AC
  Administered 2019-09-01: 01:00:00 500 mL via INTRAVENOUS

## 2019-09-01 MED ORDER — ONDANSETRON HCL 4 MG/2ML IJ SOLN
2.0000 mg | Freq: Once | INTRAMUSCULAR | Status: AC
  Administered 2019-09-01: 2 mg via INTRAVENOUS
  Filled 2019-09-01: qty 2

## 2019-09-01 MED ORDER — ONDANSETRON 4 MG PO TBDP
2.0000 mg | ORAL_TABLET | Freq: Once | ORAL | Status: AC
  Administered 2019-09-01: 2 mg via ORAL
  Filled 2019-09-01: qty 1

## 2019-09-01 NOTE — ED Provider Notes (Signed)
[facility] EMERGENCY DEPARTMENT Provider Note   CSN: [id number] Arrival date & time: 08/31/19  1848     History Chief Complaint  Patient presents with  . Emesis    Ashley [name] is a 74 y.o. female.  The history is provided by the patient and the spouse.  Emesis Severity:  Moderate Timing:  Intermittent Progression:  Worsening Chronicity:  New Relieved by:  Nothing Worsened by:  Nothing Associated symptoms: abdominal pain, chills, cough and diarrhea   Associated symptoms: no fever   Patient with extensive medical history including CHF, COPD, ICD in place presents with vomiting abdominal pain and diarrhea.  She reports that this may be related to food that she ate last night.  She then started having multiple episodes of nonbloody emesis.  She now reports having loose diarrhea.  She also reports diffuse abdominal cramping.  She is also reporting a cough. Husband is not ill with these symptoms   Patient has ICD in place but no recent shocks Patient denies active chest pain, but reports recently that her "heart hurts" Past Medical History:  Diagnosis Date  . Abdominal aortic atherosclerosis ([place]) 11/2015   by CT  . Allergy   . Anemia   . Anxiety   . Automatic implantable cardioverter-defibrillator in situ 2012   a. MDT single lead ICD.  Marland Kitchen Bronchiectasis    a. possibly mild, treat URIs aggressively  . Cancer (HCC)    basal cell, arms & neck  . CHF (congestive heart failure) ([place])   . Chronic sinusitis   . COPD (chronic obstructive pulmonary disease) ([place])   . Depression   . Dyspnea   . Essential hypertension   . Fibrocystic breast disease   . Fibromyalgia   . GERD (gastroesophageal reflux disease)   . H/O multiple allergies 10/10/2013  . Hemorrhoids   . History of diverticulitis of colon   . History of pneumonia 2012  . Insomnia   . Irritable bowel syndrome   . Laryngeal nodule    Dr. Thomasena Edis  . Migraine without aura, without mention of  intractable migraine without mention of status migrainosus   . Mitral regurgitation   . Nonischemic cardiomyopathy ([place])    a. Recovered LV fxn - prev as low as 25%, now 50-55% by echo 04/2014;  b. 2012 s/p MDT [id number] [place] VR single lead ICD.  Marland Kitchen Osteoarthritis    knees, back, hands, low back,   . Osteoporosis 01/2013   T-2.7 spine (01/2016)  . PONV (postoperative nausea and vomiting)   . sigmoid diverticulitis with perforation, abscess and fistula 09/30/2013  . Sleep apnea 2010   borderline, inconclusive- /w [place]     Patient Active Problem List   Diagnosis Date Noted  . Cervical pain (neck) 02/04/2019  . Bilateral leg numbness 06/01/2018  . Bursitis of right hip 06/01/2018  . Right ear pain 02/16/2018  . Mixed hyperlipidemia 01/17/2018  . Plantar fasciitis, bilateral 12/13/2017  . Acquired renal cyst of right kidney 08/11/2017  . Unsteadiness on feet 07/24/2017  . Multiple rib fractures 06/19/2017  . 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 High total serum IgM 11/22/2016  . 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 Bleeding external hemorrhoids 10/31/2016  . LBBB (left bundle branch block) 07/11/2016  . Gastritis 06/12/2016  . Fecal incontinence   . Chronic generalized abdominal pain 11/22/2015  . Abdominal aortic atherosclerosis ([place]) 11/13/2015  . 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  . Sore throat 07/12/2015  . Hoarseness 06/21/2015  . Hypersensitivity to  pneumococcal vaccine 06/10/2015  . Medicare annual wellness visit, subsequent 06/08/2015  . Advanced care planning/counseling discussion 06/08/2015  . Intertrigo 04/06/2015  . Chronic daily headache 04/06/2015  . Peripheral neuropathy 04/06/2015  . S/P colostomy takedown 02/10/2014  . 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 H/O multiple allergies 10/10/2013  . History of diverticular abscess 09/25/2013  . Osteoporosis 01/13/2013  . Irregular heart beat 08/06/2012  . MDD (major depressive  disorder), recurrent episode, moderate ([place]) 07/09/2012  . Chronic recurrent sinusitis 06/17/2012  . Vitamin D deficiency 03/05/2012  . Essential tremor 12/29/2011  . GERD (gastroesophageal reflux disease) 11/13/2011  . Allergic rhinitis 11/13/2011  . CKD (chronic kidney disease) stage 3, GFR 30-59 ml/min (HCC) 11/09/2011  . Epistaxis, recurrent 10/11/2011  . Cardiomyopathy, dilated, nonischemic ([place]) 03/01/2011  . Mitral regurgitation 02/24/2011  . Chronic systolic CHF (congestive heart failure) ([place]) 02/24/2011  . Chronic bronchitis ([place]) 01/20/2011  . Syncope 11/02/2010  . Chronic fatigue and malaise 11/02/2010  . Anxiety state 08/18/2010  . 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  . COMMON MIGRAINE 11/22/2006  . Irritable bowel syndrome 11/22/2006  . FIBROCYSTIC BREAST DISEASE 11/22/2006  . Osteoarthritis 11/22/2006  . Fibromyalgia 11/22/2006    Past Surgical History:  Procedure Laterality Date  . ABDOMINAL HYSTERECTOMY  1987   w/BSO  . ANAL RECTAL MANOMETRY N/A 02/02/2016   Procedure: ANO RECTAL MANOMETRY;  Surgeon: Mauri Pole, MD;  Location: [facility];  Service: Endoscopy;  Laterality: N/A;  . APPENDECTOMY    . AUGMENTATION MAMMAPLASTY    . BIV UPGRADE N/A 11/04/2018   Procedure: BIV ICD  UPGRADE;  Surgeon: Evans Lance, MD;  Location: [facility];  Service: Cardiovascular;  Laterality: N/A;  . breast cystectomies     due to FCBD  . BREAST MASS EXCISION  01/2010   Left-fibrocystic change w/intraductal papilloma, no malignancy  . carotid US  [id number]   [id number] LICA, normal RICA  . [employer] Bunionectomy  11/04/08   Right Great Toe (Dr. Beola Cord)  . COLONOSCOPY  01/2018   1 small benign polyp, diverticulosis, healthy anastomosis [name] @ [facility])  . COLOSTOMY N/A 10/02/2013   DESCENDING COLOSTOMY;  Surgeon: Imogene Burn. Tsuei, [place] N/A 02/10/2014   [name], MD  . EMG/MCV  10/16/01   + mild carpal tunnel  . ESOPHAGOGASTRODUODENOSCOPY   09/2014   small HH, irregular Z line, fundic gland polyps with mild gastritis ([place])  . Flex laryngoscopy  06/11/06   ([place]) nml  . IMPLANTABLE CARDIOVERTER DEFIBRILLATOR IMPLANT N/A 07/14/2011   MDT single chamber ICD  . KNEE ARTHROSCOPY W/ ORIF     Left; "meniscus tear"  . LASIK     left eye  . MANDIBLE SURGERY     "got about 6 pins in the bottom of my jaw"  . NCS  11/2014   L ulnar neuropathy, mild B median nerve entrapment ([place])  . OSTEOTOMY     Left foot  . PARTIAL COLECTOMY N/A 10/02/2013   PARTIAL COLECTOMY;  Surgeon: Imogene Burn. Tsuei, MD  . RIGHT/LEFT HEART CATH AND CORONARY ANGIOGRAPHY N/A 10/16/2018   Procedure: RIGHT/LEFT HEART CATH AND CORONARY ANGIOGRAPHY;  Surgeon: Wellington Hampshire, MD;  Location: [facility];  Service: Cardiovascular;  Laterality: N/A;  . sinus surgery     x3 with balloon   . TOE AMPUTATION     "toe beside baby toe on left foot; got gangrene from corn"  . TONSILLECTOMY AND  ADENOIDECTOMY       OB History   No obstetric history on file.     Family History  Problem Relation Age of Onset  . Lung cancer Father        + smoker  . Colon polyps Father   . Dementia Mother   . Osteoporosis Mother        Lumbar spine  . Stroke Mother        x 5 @ 44 YOA  . Arthritis Mother        hands  . Emphysema Mother   . Alcohol abuse Paternal Grandfather   . Stroke Paternal Grandfather        ?  Marland Kitchen Heart disease Paternal Grandfather   . Hypertension Maternal Grandfather        ?  Marland Kitchen Heart disease Paternal Grandmother   . Colon cancer Neg Hx   . Esophageal cancer Neg Hx   . Gallbladder disease Neg Hx     Social History   Tobacco Use  . Smoking status: Never Smoker  . Smokeless tobacco: Never Used  . Tobacco comment: Lived with smokers x 23 yrs, smoked herself "for a week"  Substance Use Topics  . Alcohol use: No    Alcohol/week: 0.0 standard drinks  . Drug use: No    Home Medications Prior to Admission medications   Medication Sig  Start Date End Date Taking? Authorizing Provider  acetaminophen (TYLENOL) 500 MG tablet Take 500 mg by mouth daily as needed for moderate pain or headache (PAIN).     [provider]  AMBULATORY NON FORMULARY MEDICATION Medication Name:  1 squatty potty to use as needed 01/20/16   Mauri Pole, MD  Ascorbic Acid (VITAMIN C) 1000 MG tablet Take 1,000 mg by mouth See admin instructions. Take 1000 mg daily, may take an additional 1000 mg as needed for cold symptoms    [provider]  aspirin EC 81 MG tablet Take 81 mg by mouth at bedtime.     [provider]  beclomethasone (QVAR REDIHALER) 80 MCG/ACT inhaler Inhale 2 puffs into the lungs 2 (two) times daily. Rinse mouth after use. 12/10/18 12/10/19  Laverle Hobby, MD  benzonatate (TESSALON) 200 MG capsule Take 1 capsule (200 mg total) by mouth 3 (three) times daily as needed for cough. Patient taking differently: Take 200 mg by mouth 3 (three) times daily as needed for cough.  06/25/19   [name], MD  BIOTIN PO Take 1 tablet by mouth daily.    [provider]  carvedilol (COREG) 3.125 MG tablet Take 1 tablet (3.125 mg total) by mouth 2 (two) times daily with a meal. 07/28/19 08/27/19  Minna Merritts, MD  cholecalciferol (VITAMIN D) 1000 units tablet Take 1,000 Units by mouth daily.     [provider]  Coenzyme Q10 (COQ-10) 100 MG CAPS Take 100 mg by mouth daily.    [provider]  dextromethorphan (DELSYM) 30 MG/5ML liquid Take 15 mg by mouth daily as needed for cough.     [provider]  Diphenhyd-Hydrocort-Nystatin (FIRST-DUKES MOUTHWASH) SUSP Swish and swallow 5 mLs every 6 (six) hours as needed (mouth blisters). 02/04/19   [name], MD  ezetimibe (ZETIA) 10 MG tablet Take 1 tablet (10 mg total) by mouth daily. 07/28/19   Minna Merritts, MD  GARLIC PO Take 1 capsule by mouth daily.    [provider]  guaiFENesin (MUCINEX) 600 MG 12 hr tablet  Take 600  mg by mouth daily.    [provider]  levalbuterol Penne Lash HFA) 45 MCG/ACT inhaler Inhale 2 puffs into the lungs every 4 (four) hours as needed for wheezing or shortness of breath. Patient taking differently: Inhale 2 puffs into the lungs every 4 (four) hours as needed for wheezing or shortness of breath.  09/20/18   [name], MD  LORazepam (ATIVAN) 0.5 MG tablet Take 0.5-1 tablets (0.25-0.5 mg total) by mouth at bedtime as needed for anxiety or sleep. Patient taking differently: Take 0.25-0.5 mg by mouth at bedtime as needed for anxiety or sleep.  11/13/18   [name], MD  miconazole (MICOTIN) 2 % powder Apply 1 application topically as needed (yeast).    [provider]  nystatin cream (MYCOSTATIN) Apply 1 application topically as needed for dry skin (yeast infection).     [provider]  potassium chloride (KLOR-CON) 10 MEQ tablet Take 1 tablet (10 mEq total) by mouth daily as needed (with laxis). 07/28/19   Minna Merritts, MD  pyridOXINE (VITAMIN B-6) 100 MG tablet Take 100 mg by mouth daily.    [provider]  Red Yeast Rice Extract (RED YEAST RICE PO) Take 1 capsule by mouth daily.    [provider]  sacubitril-valsartan (ENTRESTO) 24-26 MG Take 1 tablet by mouth 2 (two) times daily. 08/07/19   Verta Ellen., NP  spironolactone (ALDACTONE) 25 MG tablet Take 0.5 tablets (12.5 mg total) by mouth daily for 30 days. 10/20/18 07/28/19  Donne Hazel, MD  torsemide (DEMADEX) 10 MG tablet Take 1 tablet (10 mg total) by mouth every other day. 08/19/19 11/17/19  Verta Ellen., NP  triamcinolone (NASACORT) 55 MCG/ACT AERO nasal inhaler Place 2 sprays into the nose daily. 06/25/19   [name], MD  vitamin E 400 UNIT capsule Take 400 Units by mouth daily.    [provider]  bisoprolol (ZEBETA) 5 MG tablet Take 5 mg by mouth daily. 06/06/11 07/02/18  Minna Merritts, MD    Allergies    Atorvastatin;  Chlorhexidine; Codeine; Cyclobenzaprine; Cymbalta [duloxetine hcl]; Diflucan [fluconazole]; Dilaudid [hydromorphone hcl]; Furosemide; Influenza vac split [flu virus vaccine]; Latex; Morphine and related; Pravastatin; Prevacid [lansoprazole]; Prevnar [pneumococcal 13-val conj vacc]; Rosuvastatin; Simvastatin; Tape; Metaxalone; Antihistamines, chlorpheniramine-type; Betadine [povidone iodine]; Onion; Other; Penicillins; Red dye; Sertraline; and Sulfasalazine  Review of Systems   Review of Systems  Constitutional: Positive for chills. Negative for fever.  Respiratory: Positive for cough.   Gastrointestinal: Positive for abdominal pain, diarrhea and vomiting. Negative for blood in stool.  All other systems reviewed and are negative.   Physical Exam Updated Vital Signs BP (!) 105/50 (BP Location: Right Arm)   Pulse 85   Temp 98.4 F (36.9 C) (Oral)   Resp (!) 21   SpO2 95%   Physical Exam CONSTITUTIONAL: Elderly, appears uncomfortable HEAD: Normocephalic/atraumatic EYES: EOMI/PERRL, no icterus ENMT: Mask in place NECK: supple no meningeal signs SPINE/BACK:entire spine nontender CV: S1/S2 noted, no murmurs/rubs/gallops noted LUNGS: Lungs are clear to auscultation bilaterally, no apparent distress ABDOMEN: soft, mild diffuse tenderness, no rebound or guarding, bowel sounds noted throughout abdomen.  Extensive scarring noted to lower abdomen from previous surgeries.  There are no incarcerated hernias noted GU:no cva tenderness NEURO: Pt is awake/alert/appropriate, moves all extremitiesx4.  No facial droop.  EXTREMITIES: pulses normal/equal, full ROM SKIN: warm, color normal PSYCH: no abnormalities of mood noted, alert and oriented to situation  ED Results / Procedures / Treatments   Labs (  all labs ordered are listed, but only abnormal results are displayed) Labs Reviewed  COMPREHENSIVE METABOLIC PANEL - Abnormal; Notable for the following components:      Result Value   CO2 19 (*)      Glucose, Bld 114 (*)    BUN 24 (*)    Creatinine, Ser 1.12 (*)    GFR calc non Af Amer 48 (*)    GFR calc Af Amer 56 (*)    All other components within normal limits  CBC - Abnormal; Notable for the following components:   WBC 12.9 (*)    All other components within normal limits  URINALYSIS, ROUTINE W REFLEX MICROSCOPIC - Abnormal; Notable for the following components:   APPearance HAZY (*)    Leukocytes,Ua MODERATE (*)    Bacteria, UA RARE (*)    All other components within normal limits  RESPIRATORY PANEL BY RT PCR (FLU A&B, COVID)  URINE CULTURE  LIPASE, BLOOD    EKG EKG Interpretation  Date/Time:  Monday September 01 2019 00:50:21 EDT Ventricular Rate:  92 PR Interval:    QRS Duration: 117 QT Interval:  361 QTC Calculation: 447 R Axis:   59 Text Interpretation: Sinus rhythm Nonspecific intraventricular conduction delay Inferior infarct, old Probable anterolateral infarct, age indeterm Confirmed by Ripley Fraise [phone]) on 09/01/2019 1:25:15 AM   Radiology CT ABDOMEN PELVIS W CONTRAST  Result Date: 09/01/2019 CLINICAL DATA:  74 year old female with abdominal pain and vomiting. History of complicated sigmoid diverticulitis. EXAM: CT ABDOMEN AND PELVIS WITH CONTRAST TECHNIQUE: Multidetector CT imaging of the abdomen and pelvis was performed using the standard protocol following bolus administration of intravenous contrast. CONTRAST:  188mL OMNIPAQUE IOHEXOL 300 MG/ML  SOLN COMPARISON:  CT Abdomen and Pelvis 06/18/2017 and earlier. FINDINGS: Lower chest: Chronic cardiac pacer or AICD leads. Stable cardiac size at the upper limits of normal to mildly increased. No pericardial effusion. Mild lung base atelectasis. Hepatobiliary: Stable liver including a subtle small hypodense area in the left lobe on series 4, image 20, likely a tiny benign cysts. Negative gallbladder. No bile duct enlargement. Pancreas: Stable truncated appearance of the pancreatic tail. Mild chronic prominence of  the pancreatic duct is stable. No pancreatic mass or inflammation. Spleen: Negative; splenule. Adrenals/Urinary Tract: Normal adrenal glands. Small chronic left renal upper pole cyst, and chronic left midpole renal scarring. Small chronic right renal lower pole cysts. No hydronephrosis. No nephrolithiasis identified. Renal enhancement and contrast excretion appears symmetric. Negative course of the right ureter. Chronic left hemipelvis phleboliths are stable, negative left ureter. Diminutive urinary bladder today, unremarkable. Stomach/Bowel: Chronic distal large bowel anastomosis in the pelvis with no adverse features on series 4, image 69. Fluid-filled nondilated large bowel today. Residual large bowel diverticulosis, now most pronounced in the ascending and transverse colon segments. There is a large chronic ventral abdominal hernia containing small bowel and transverse colon along with mesentery. The ventral hernia defect is about 8 cm, and the lobulated hernia sac is about 10 x 20 cm, not significantly changed from 2019. The herniated loops do not appear obstructed or inflamed. Fluid throughout nondilated small bowel. Small gastric hiatal hernia. Fairly decompressed stomach. No free air. No free fluid. No mesenteric stranding. The tip of the cecum is located in the pelvis. Diminutive or absent appendix. Vascular/Lymphatic: Aortoiliac calcified atherosclerosis. The major arterial structures in the abdomen and pelvis are patent. Portal venous system appears to be patent. No lymphadenopathy. Reproductive: Surgically absent. Other: No pelvic free fluid. Musculoskeletal: Chronic right posterior 9th  through 11th rib fractures. Osteopenia. No acute osseous abnormality identified. IMPRESSION: 1. Fluid throughout nondilated large bowel today compatible with diarrhea or enema use. Diverticulosis of the right and transverse colon with no active inflammation identified. 2. Stable large chronic ventral abdominal hernia  containing small bowel and transverse colon, no evidence of incarceration or obstruction. 3. No other acute or inflammatory process identified in the abdomen or pelvis. 4. Aortic Atherosclerosis (ICD10-I70.0). Electronically Signed   By: Genevie Ann M.D.   On: 09/01/2019 04:59    Procedures Procedures   Medications Ordered in ED Medications  sodium chloride flush (NS) 0.9 % injection 3 mL (3 mLs Intravenous Not Given 09/01/19 0102)  ondansetron (ZOFRAN) injection 2 mg (2 mg Intravenous Given 09/01/19 0117)  sodium chloride 0.9 % bolus 500 mL (0 mLs Intravenous Stopped 09/01/19 0259)  ondansetron (ZOFRAN-ODT) disintegrating tablet 2 mg (2 mg Oral Given 09/01/19 0241)  iohexol (OMNIPAQUE) 300 MG/ML solution 100 mL (100 mLs Intravenous Contrast Given 09/01/19 0424)    ED Course  I have reviewed the triage vital signs and the nursing notes.  Pertinent labs & imaging results that were available during my care of the patient were reviewed by me and considered in my medical decision making (see chart for details).    MDM Rules/Calculators/A&P                      1:38 AM Patient presents with vomiting and diarrhea.  She appears dehydrated. Due to history of CHF, plan to give 500 mL bolus normal saline over an hour.  Also provide half dose of Zofran IV Patient declines pain medications. Will defer CT imaging for now unless pain escalates or her symptoms worsen 2:36 AM Patient reports nausea is improving, but is still having abdominal pain, feels it might be diverticulitis.  We will proceed with CT abdomen pelvis.  Patient can receive IV contrast without difficulty 5:56 AM CT scan is negative.  Patient is improving.  She is taking p.o. fluids She feels comfortable for discharge home. Likely viral illness that will be self-limited.  We discussed strict return precautions. Urine culture has been ordered, patient without any recent urinary symptom   This patient presents to the ED for concern of  abdominal pain/vomiting/diarrhea, this involves an extensive number of treatment options, and is a complaint that carries with it a high risk of complications and morbidity.  The differential diagnosis includes gastroenteritis, bowel perforation, bowel obstruction, diverticulitis   Lab Tests:   I Ordered, reviewed, and interpreted labs, which included lipase, complete blood count, metabolic panel, [id number] test  Medicines ordered:   I ordered medication Zofran and IV fluid for nausea and dehydration  Imaging Studies ordered:   I ordered imaging studies which included CT abdomen pelvis and  I independently visualized and interpreted imaging which showed no acute abdominal finding  Additional history obtained:   Additional history obtained from husband  Previous records obtained and reviewed   Reevaluation:  After the interventions stated above, I reevaluated the patient and found patient is improved  Final Clinical Impression(s) / ED Diagnoses Final diagnoses:  Nausea vomiting and diarrhea  Dehydration    Rx / DC Orders ED Discharge Orders    None       Ripley Fraise, MD 09/01/19 [phone]

## 2019-09-01 NOTE — Telephone Encounter (Signed)
Per chart review tab pt was at [facility] ED on 08/31/19.

## 2019-09-01 NOTE — Telephone Encounter (Signed)
Seen at ER yesterday treated with IVF. Fortunately had reassuring labs and CT scan. Urine culture pending.  plz call for update on GI symptoms.

## 2019-09-01 NOTE — ED Notes (Signed)
Pt transported to CT scan.

## 2019-09-01 NOTE — Telephone Encounter (Signed)
Pt reports she thinks she ate a bad salad on Sat night and went to the ER. She reports she is feeling much better today. Denies nausea, vomiting or diarrhea and is taking it easy and eating lightly. She thinks she had food poisoning. Advised if any symptoms return. Pt verbalized understanding.

## 2019-09-01 NOTE — Telephone Encounter (Signed)
Noted. Referral have been placed 

## 2019-09-01 NOTE — Telephone Encounter (Signed)
Ashley [name] - Client TELEPHONE ADVICE RECORD [facility] Patient Name: Ashley [name] Gender: Female DOB: Mar 18, 1946 Age: 74 Y 56 M 12 D Return Phone Number: [phone] (Primary) [place] [address] Client Bulloch [name] - Client Client Site [place] Physician [name] - MD Contact Type Call Who Is Calling Patient / Member / Family / Caregiver Call Type Triage / Clinical Relationship To Patient Self Return Phone Number [phone] (Primary) Chief Complaint Vomiting Reason for Call Symptomatic / Request for Ashley [name] says that she had salad at a restaurant last [name] and she vomited 4 times last [name], and had not vomited until a few min ago. Last urine about 10 min ago. She has a bad heart. She will be checked for kidney problems this week. She took potassium and Lasix too often to reduce her sodium and her kidneys are now in jeopardy. She drank Pedialite and vomited, and also the apple juice. Translation No Nurse Assessment Nurse: Caryn Section, RN, Butch Penny Date/Time (Eastern Time): 08/31/2019 5:19:17 PM Confirm and document reason for call. If symptomatic, describe symptoms. ---Caller states that she ate a salad at a restaurant yesterday. She vomited x 4 yesterday. She vomited x 1 this afternoon. Denies fever or diarrhea. Last urination was 5:10 pm. Pt denies dizziness but reports feeling weakness. Has the patient had close contact with a person known or suspected to have the novel coronavirus illness OR traveled / lives in area with major community spread (including international travel) in the last 14 days from the onset of symptoms? * If Asymptomatic, screen for exposure and travel within the last 14 days. ---No Does the patient have any new or worsening symptoms? ---Yes Will a triage be completed? ---Yes Related visit  to physician within the last 2 weeks? ---No Does the PT have any chronic conditions? (i.e. diabetes, asthma, this includes High risk factors for pregnancy, etc.) ---Yes List chronic conditions. ---renal failure, CHF, COPD, asthma, anxiety, diverticulitis with colon resection Is this a behavioral health or substance abuse call? ---No PLEASE NOTE: All timestamps contained within this report are represented as [place] Standard Time. CONFIDENTIALTY NOTICE: This fax transmission is intended only for the addressee. It contains information that is legally privileged, confidential or otherwise protected from use or disclosure. If you are not the intended recipient, you are strictly prohibited from reviewing, disclosing, copying using or disseminating any of this information or taking any action in reliance on or regarding this information. If you have received this fax in error, please notify us immediately by telephone so that we can arrange for its return to [place]. Phone: [phone], Toll-Free: [phone], Fax: [phone] Page: 2 of 2 Call Id: [id number] Guidelines Guideline Title Affirmed Question Affirmed Notes Nurse Date/Time [name] Time) Vomiting High-risk adult (e.g., diabetes mellitus, brain tumor, V-P shunt, hernia) Caryn Section, RN, Butch Penny 08/31/2019 5:28:04 PM Disp. Time [name] Time) Disposition Final User 08/31/2019 5:30:22 PM Go to ED Now (or PCP triage) Yes Caryn Section, RN, [facility] Disagree/Comply Comply Caller Understands Yes PreDisposition Call Doctor Care Advice Given Per Guideline GO TO ED NOW (OR PCP TRIAGE): * IF NO PCP (PRIMARY CARE PROVIDER) SECOND-LEVEL TRIAGE: You need to be seen within the next hour. Go to the [facility] at _____________ [place] as soon as you can. CARE ADVICE per Vomiting (Adult) guideline. Comments User: Donato Schultz, RN Date/Time [name] Time): 08/31/2019 5:33:01 PM Client directives have [facility] for nausea/vomiting. Pt has a  HX of heart and renal  failure. Recommendation was for ED evaluation. Pt verbalized understanding. Referrals GO TO FACILITY UNDECIDED

## 2019-09-02 LAB — URINE CULTURE: Culture: <10000 — AB

## 2019-09-03 ENCOUNTER — Other Ambulatory Visit: Payer: Self-pay

## 2019-09-03 ENCOUNTER — Ambulatory Visit: Payer: Medicare Other

## 2019-09-03 DIAGNOSIS — K219 Gastro-esophageal reflux disease without esophagitis: Secondary | ICD-10-CM

## 2019-09-03 DIAGNOSIS — I5022 Chronic systolic (congestive) heart failure: Secondary | ICD-10-CM

## 2019-09-03 DIAGNOSIS — E782 Mixed hyperlipidemia: Secondary | ICD-10-CM

## 2019-09-03 DIAGNOSIS — F331 Major depressive disorder, recurrent, moderate: Secondary | ICD-10-CM

## 2019-09-03 NOTE — Chronic Care Management (AMB) (Signed)
Chronic Care Management Pharmacy  Name: DIVINA ABARE  MRN: [id number] DOB: November 09, 1945  Chief Complaint/ HPI  [name],  74 y.o., female presents for their Initial CCM visit with the clinical pharmacist via telephone.  PCP : [name], MD  Their chronic conditions include: hyperlipidemia, heart failure, chronic bronchitis, allergic rhinitis, anxiety, MDD   Patient concerns: hospitalized this past weekend for N/V for possible viral infection, beginning to feel a little better   Office Visits:  06/25/19: Danise Mina - applied for home BP cuff through insurance, change nasal steroid to qhs only   Consult Visit:  07/28/19: Cardiology - heart failure, try increasing Entresto to BID, continue coreg and spironolactone  Allergies  Allergen Reactions  . Atorvastatin Other (See Comments)    Severe muscle cramps to generic atorvastatin.  Able to take brand lipitor.  . Chlorhexidine Other (See Comments)    blisters  . Codeine Other (See Comments)    Confusion and dizziness  . Cyclobenzaprine Other (See Comments)    Adverse reaction - back and throat pain, dizziness, exhaustion  . Cymbalta [Duloxetine Hcl] Other (See Comments)    "body spasms and made me feel weird in my head"   . Diflucan [Fluconazole] Nausea And Vomiting  . Dilaudid [Hydromorphone Hcl] Other (See Comments)    Feels like she is burning internally   . Furosemide Other (See Comments)    Severe hypotension   . Influenza Vac Split [Flu Virus Vaccine] Other (See Comments)    "allergic to concentrated eggs that are in vaccine"  . Latex Nausea Only, Rash and Other (See Comments)    Pain and infection  . Morphine And Related Other (See Comments)    Internally feels like she is on fire   . Pravastatin Other (See Comments)    myalgias  . Prevacid [Lansoprazole] Other (See Comments)    Worsened GI side effects  . Prevnar [Pneumococcal 13-Val Conj Vacc] Other (See Comments)    Egg allergy Bad  reaction after shot  . Rosuvastatin Other (See Comments)    Was not effective controlling lipids  . Simvastatin Other (See Comments)    Leg cramps  . Tape Other (See Comments) and Rash    All adhesives  - Blisters, itching and burning  blistering  . Metaxalone   . Antihistamines, Chlorpheniramine-Type Other (See Comments)    Alters vision  . Betadine [Povidone Iodine] Rash    Per pt.   . Onion Diarrhea and Other (See Comments)    GI distress, flared IBS  . Other Hives and Rash    NOTE: pt is able to take cephalosporins without reaction  . Penicillins Other (See Comments)    CHILDHOOD ALLEGY/REACTION: "told 50 years ago I couldn't take it; might be immune to it", able to take amoxicillin Has patient had a PCN reaction causing immediate rash, facial/tongue/throat swelling, SOB or lightheadedness with hypotension: Unknown Has patient had a PCN reaction causing severe rash involving mucus membranes or skin necrosis: UnknowN Has patient had a PCN reaction that required hospitalization: /Unknown Has patient had a PCN reaction occurring within the last 10 years: Unknown If a  . Red Dye Nausea Only and Swelling  . Sertraline Other (See Comments)    Overly sedating  . Sulfasalazine Other (See Comments)    "makes my whole body smell like sulfa & makes me sick just smelling it"   Medications: Outpatient Encounter Medications as of 09/03/2019  Medication Sig  . acetaminophen (TYLENOL) 500 MG tablet Take  500 mg by mouth daily as needed for moderate pain or headache (PAIN).   . AMBULATORY NON FORMULARY MEDICATION Medication Name:  1 squatty potty to use as needed  . Ascorbic Acid (VITAMIN C) 1000 MG tablet Take 1,000 mg by mouth See admin instructions. Take 1000 mg daily, may take an additional 1000 mg as needed for cold symptoms  . aspirin EC 81 MG tablet Take 81 mg by mouth at bedtime.   . beclomethasone (QVAR REDIHALER) 80 MCG/ACT inhaler Inhale 2 puffs into the lungs 2 (two) times daily.  Rinse mouth after use.  . benzonatate (TESSALON) 200 MG capsule Take 1 capsule (200 mg total) by mouth 3 (three) times daily as needed for cough.  . carvedilol (COREG) 3.125 MG tablet Take 1 tablet (3.125 mg total) by mouth 2 (two) times daily with a meal. (Patient taking differently: Take 3.125 mg by mouth 2 (two) times daily with a meal. 1/2 tablet BID)  . cholecalciferol (VITAMIN D) 1000 units tablet Take 1,000 Units by mouth daily.   . Coenzyme Q10 (COQ-10) 100 MG CAPS Take 100 mg by mouth daily.  . Diphenhyd-Hydrocort-Nystatin (FIRST-DUKES MOUTHWASH) SUSP Swish and swallow 5 mLs every 6 (six) hours as needed (mouth blisters).  . ezetimibe (ZETIA) 10 MG tablet Take 1 tablet (10 mg total) by mouth daily.  Marland Kitchen GARLIC PO Take 1 capsule by mouth daily.  Marland Kitchen guaiFENesin (MUCINEX) 600 MG 12 hr tablet Take 600 mg by mouth daily.  Marland Kitchen levalbuterol (XOPENEX HFA) 45 MCG/ACT inhaler Inhale 2 puffs into the lungs every 4 (four) hours as needed for wheezing or shortness of breath. (Patient taking differently: Inhale 2 puffs into the lungs every 4 (four) hours as needed for wheezing or shortness of breath. )  . Menthol, Topical Analgesic, (BIOFREEZE ROLL-ON EX) Apply topically.  . miconazole (MICOTIN) 2 % powder Apply 1 application topically as needed (yeast).  . nystatin cream (MYCOSTATIN) Apply 1 application topically as needed for dry skin (yeast infection).   . potassium chloride (KLOR-CON) 10 MEQ tablet Take 1 tablet (10 mEq total) by mouth daily as needed (with laxis).  Marland Kitchen pyridOXINE (VITAMIN B-6) 100 MG tablet Take 100 mg by mouth daily.  . Red Yeast Rice Extract (RED YEAST RICE PO) Take 1 capsule by mouth in the morning and at bedtime.   . sacubitril-valsartan (ENTRESTO) 24-26 MG Take 1 tablet by mouth 2 (two) times daily.  Marland Kitchen spironolactone (ALDACTONE) 25 MG tablet Take 0.5 tablets (12.5 mg total) by mouth daily for 30 days. (Patient taking differently: Take 25 mg by mouth daily. 1 tablet every other day)   . torsemide (DEMADEX) 10 MG tablet Take 1 tablet (10 mg total) by mouth every other day.  . triamcinolone (NASACORT) 55 MCG/ACT AERO nasal inhaler Place 2 sprays into the nose daily.  . vitamin E 400 UNIT capsule Take 400 Units by mouth daily.  Marland Kitchen BIOTIN PO Take 1 tablet by mouth daily.  Marland Kitchen dextromethorphan (DELSYM) 30 MG/5ML liquid Take 15 mg by mouth daily as needed for cough.   . diclofenac Sodium (VOLTAREN) 1 % GEL Apply 1 application topically 4 (four) times daily.  Marland Kitchen LORazepam (ATIVAN) 0.5 MG tablet Take 0.5-1 tablets (0.25-0.5 mg total) by mouth at bedtime as needed for anxiety or sleep. (Patient not taking: Reported on 09/06/2019)  . [DISCONTINUED] bisoprolol (ZEBETA) 5 MG tablet Take 5 mg by mouth daily.   No facility-administered encounter medications on file as of 09/03/2019.   Current Diagnosis/Assessment:   Emergency planning/management officer  Strain: Low Risk   . Difficulty of Paying Living Expenses: Not hard at all    Goals Addressed            This Visit's Progress   . Pharmacy Care Plan: Cholesterol       CARE PLAN ENTRY  Current Barriers:  . Uncontrolled hyperlipidemia . Current antihyperlipidemic regimen:   Red yeast rice - 1 capsule BID  Ezetimibe 10 mg - 1 tablet daily  . Previous antihyperlipidemic medications tried: atorvastatin, pravastatin, simvastatin, rosuvastatin . Most recent lipid panel:     Component Value Date/Time   CHOL 217 (H) 03/18/2019 0933   TRIG 199.0 (H) 03/18/2019 0933   HDL 46.60 03/18/2019 0933   CHOLHDL 5 03/18/2019 0933   VLDL 39.8 03/18/2019 0933   LDLCALC 131 (H) 03/18/2019 0933   LDLDIRECT 164.0 11/27/2018 1407 .  ASCVD risk enhancing conditions: age >55, CHF . 10-year ASCVD risk score: 14.1% (intermediate risk)  Pharmacist Clinical Goal(s):  Marland Kitchen Over the next 6 months, patient will work with PharmD and providers toward the following goals: o Reduce LDL (bad cholesterol) within goal of less than 100 o Improve HDL (good cholesterol) within  goal of greater than 50 o Reduce triglycerides within goal of less than 150   Interventions: . Comprehensive medication review performed; medication list updated in electronic medical record.   Patient Self Care Activities:  . Patient will focus on medication adherence by using pillbox and taking medication daily . Focus on heart healthy diet high in fiber, whole grains, lean meats, fruits, vegetables, and fish   Initial goal documentation    . Pharmacy Care Plan: General       CARE PLAN ENTRY  Current Barriers:  . Chronic Disease Management support, education, and care coordination needs related to congestive heart failure  Pharmacist Clinical Goal(s):  Marland Kitchen Over the next 6 months, patient will work with PharmD and primary care provider to address the following goals: o Heart Failure: Prevent shortness of breath and fluid retention  Interventions: . Comprehensive medication review performed  Patient Self Care Activities:  For the next 6 months until follow up visit:  . Weigh daily first thing in the morning, if weight gain of 3 pounds overnight or 5 pounds in 1 week, take 1 torsemide daily until weight returned to baseline  Initial goal documentation      Hyperlipidemia   Lipid Panel     Component Value Date/Time   CHOL 217 (H) 03/18/2019 0933   TRIG 199.0 (H) 03/18/2019 0933   HDL 46.60 03/18/2019 0933   CHOLHDL 5 03/18/2019 0933   VLDL 39.8 03/18/2019 0933   LDLCALC 131 (H) 03/18/2019 0933   LDLDIRECT 164.0 11/27/2018 1407    CBC Latest Ref Rng & Units 08/31/2019 06/27/2019 10/19/2018  WBC 4.0 - 10.5 K/uL 12.9(H) 8.4 13.1(H)  Hemoglobin 12.0 - 15.0 g/dL 13.7 12.7 13.9  Hematocrit 36.0 - 46.0 % 42.0 38.8 42.2  Platelets 150 - 400 K/uL 211 203.0 199   The 10-year ASCVD risk score Mikey Bussing DC Jr., et al., 2013) is: 14.1%   Values used to calculate the score:     Age: 31 years     Sex: Female     Is Non-Hispanic African American: No     Diabetic: No     Tobacco smoker:  No     Systolic Blood Pressure: 123XX123 mmHg     Is BP treated: Yes     HDL Cholesterol: 46.6 mg/dL  Total Cholesterol: 217 mg/dL   LDL goal < 100 Patient has failed these meds in past: multiple statins (muscle cramps/ineffective in controlling lipids) Patient is currently uncontrolled on the following medications:   Red yeast rice - 1 capsule BID  Ezetimibe 10 mg - 1 tablet daily   CoQ10 100 mg - 1 tablet daily   Aspirin 81 mg - 1 tablet daily  Diet: tries to eat lean meats (chicken, fish), cooks with air fryer or grills on stove; greens; drinks water and apple juice, 2% milk; denies sodas, stopped drinking diet cokes January 2021; still enjoys chocolate/sweets  Plan: Continue current medications; Focus on heart healthy diet   Heart Failure   Type: Systolic  Last ejection fraction: 02/2019: EF 30 to 35% NYHA Class: IV (significant limitation of activity / dyspnea at rest) AHA HF Stage: C (Heart disease and symptoms present)  Patient has failed these meds in past: none reported Patient is currently controlled on the following medications:   Spironolactone 25 mg - 1 tablet every other day  Carvedilol 3.125 mg - 1/2 tablet BID  Entresto 24-26 mg - 1 tablet BID (taking BID)  Torsemide 10 mg - 1 tablet every other day  Potassium chloride 10 mEq - 1 tablet daily PRN (with torsemide)  We discussed: reports BP usually runs around 112/59 mmHg; weighing daily, if weight gain of more than 3 lbs overnight or 5 lbs in 1 week take torsemide until weight resolved  Plan: Continue current medications   Chronic Bronchitis    Last spirometry score: 03/2011 FEV-1 69% Gold Grade: Gold 2 (FEV1 50-79%)   Eosinophil count:   Lab Results  Component Value Date/Time   EOSPCT 2.5 06/27/2019 11:14 AM   EOSPCT 1.4 02/14/2017 11:15 AM  %                               Eos (Absolute):  Lab Results  Component Value Date/Time   EOSABS 0.2 06/27/2019 11:14 AM   EOSABS 0.1 02/14/2017 11:15  AM   Tobacco Status:  Social History   Tobacco Use  Smoking Status Never Smoker  Smokeless Tobacco Never Used  Tobacco Comment   Lived with smokers x 23 yrs, smoked herself "for a week"   Patient has failed these meds in past: Advair, Spiriva no symptomatic relief  Patient is currently controlled on the following medications:   Beclomethasone (QVAR ) 80 mcg/act - 2 puffs in lungs BID PRN  First Dukes Mouthwash - every 6 hours PRN   Xopenex 45 mcg -  2 puffs q4h PRN  Albuterol inhaler - PRN  Allergies/Cough:  Benzonatate 200 mg - 1 capsule TID PRN cough (takes maybe 2 a week)  Nasacort 55 mcg - 2 sprays in nose daily every morning  Nasal saline spray - 1 spray in each nostril qhs  Guaifenesin 600 mg - 1 tablet daily  Using maintenance inhaler regularly? No (prescribed PRN) Rescue inhaler: takes Xopenex 2-3 times a week, rarely uses albuterol (considers this her rescue inhaler); educated on similarities between Xopenex and albuterol as rescue inhalers  We discussed: only taking QVAR as needed (once a week) - did not have benefit with daily steroid/LAMA, rinses out mouth after each use, reviewed indication for Dukes mouthwash, pt only uses PRN thrush, denies nose bleeds since reducing nasacort to once daily and adding saline spray  Plan: Continue current medications  Anxiety   Patient has failed these meds  in past: none Patient is currently controlled on the following medications:   No pharmacotherapy  Lorazepam 0.5 mg - 1/2-1 tablet qhs PRN anxiety/sleep (not taking)  We discussed: not taking lorazepam, last dose > 1 year ago; reported made her depressed, prefers to manage without medication  Plan: Continue management without medication.  Vaccines   Reviewed and discussed patient's vaccination history.    Immunization History  Administered Date(s) Administered  . Pneumococcal Conjugate-13 06/08/2015  . Pneumococcal Polysaccharide-23 02/16/2012  . Td 11/12/2005    Plan: Unable to take vaccines due to history of severe allergies.   Medication Management  OTCs: vitamin E 400 units - daily, vitamin B6 100 mg - 1 tablet daily, nystatin cream PRN (needs refill - applies to abdomen/scar), miconazole 2% powder - PRN, garlic PO daily, diclofenac gel 1% - apply QID PRN - ineffective/not taking, vitamin D 1000 IU - daily, Biotin (not taking), MiraLAX PRN, vitamin C 1000 mg daily, Tylenol 500 mg, Biofreeze roll on prn 3-4x,   Pharmacy/Benefits: [facility] [address]   Affordability: applying for Entresto PAP with cardiology; insurance prefers [facility]   CCM Follow Up: 03/08/20 at 2:30 PM (telephone)  [name], PharmD Clinical Pharmacist [facility] [phone]

## 2019-09-04 ENCOUNTER — Ambulatory Visit (INDEPENDENT_AMBULATORY_CARE_PROVIDER_SITE_OTHER): Payer: Medicare Other

## 2019-09-04 ENCOUNTER — Other Ambulatory Visit: Payer: Self-pay

## 2019-09-04 DIAGNOSIS — I420 Dilated cardiomyopathy: Secondary | ICD-10-CM

## 2019-09-04 DIAGNOSIS — R2689 Other abnormalities of gait and mobility: Secondary | ICD-10-CM | POA: Diagnosis not present

## 2019-09-04 DIAGNOSIS — M25579 Pain in unspecified ankle and joints of unspecified foot: Secondary | ICD-10-CM | POA: Diagnosis not present

## 2019-09-04 MED ORDER — PERFLUTREN LIPID MICROSPHERE
1.0000 mL | INTRAVENOUS | Status: AC | PRN
  Administered 2019-09-04: 2 mL via INTRAVENOUS

## 2019-09-05 ENCOUNTER — Other Ambulatory Visit: Payer: Self-pay | Admitting: Physician Assistant

## 2019-09-05 DIAGNOSIS — H9201 Otalgia, right ear: Secondary | ICD-10-CM

## 2019-09-05 DIAGNOSIS — G501 Atypical facial pain: Secondary | ICD-10-CM | POA: Diagnosis not present

## 2019-09-05 DIAGNOSIS — H6123 Impacted cerumen, bilateral: Secondary | ICD-10-CM | POA: Diagnosis not present

## 2019-09-06 ENCOUNTER — Other Ambulatory Visit: Payer: Self-pay

## 2019-09-06 NOTE — Telephone Encounter (Signed)
Patient would like refill on nystatin cream for abdomen.  [name], PharmD Clinical Pharmacist [facility] [phone]

## 2019-09-06 NOTE — Patient Instructions (Addendum)
Dear Ashley [name],  It was a pleasure meeting you during our initial appointment on September 03, 2019. Below is a summary of the goals we discussed and components of chronic care management. Please contact me anytime with questions or concerns.   Visit Information  Goals Addressed            This Visit's Progress   . Pharmacy Care Plan: Cholesterol       CARE PLAN ENTRY  Current Barriers:  . Uncontrolled hyperlipidemia . Current antihyperlipidemic regimen:   Red yeast rice - 1 capsule BID  Ezetimibe 10 mg - 1 tablet daily  . Previous antihyperlipidemic medications tried: atorvastatin, pravastatin, simvastatin, rosuvastatin . Most recent lipid panel:     Component Value Date/Time   CHOL 217 (H) 03/18/2019 0933   TRIG 199.0 (H) 03/18/2019 0933   HDL 46.60 03/18/2019 0933   CHOLHDL 5 03/18/2019 0933   VLDL 39.8 03/18/2019 0933   LDLCALC 131 (H) 03/18/2019 0933   LDLDIRECT 164.0 11/27/2018 1407 .  ASCVD risk enhancing conditions: age >71, CHF . 10-year ASCVD risk score: 14.1% (intermediate risk)  Pharmacist Clinical Goal(s):  Marland Kitchen Over the next 6 months, patient will work with PharmD and providers toward the following goals: o Reduce LDL (bad cholesterol) within goal of less than 100 o Improve HDL (good cholesterol) within goal of greater than 50 o Reduce triglycerides within goal of less than 150   Interventions: . Comprehensive medication review performed; medication list updated in electronic medical record.   Patient Self Care Activities:  . Patient will focus on medication adherence by using pillbox and taking medication daily . Focus on heart healthy diet high in fiber, whole grains, lean meats, fruits, vegetables, and fish   Initial goal documentation    . Pharmacy Care Plan: General       CARE PLAN ENTRY  Current Barriers:  . Chronic Disease Management support, education, and care coordination needs related to congestive heart failure  Pharmacist Clinical  Goal(s):  Marland Kitchen Over the next 6 months, patient will work with PharmD and primary care provider to address the following goals: o Heart Failure: Prevent shortness of breath and fluid retention  Interventions: . Comprehensive medication review performed  Patient Self Care Activities:  For the next 6 months until follow up visit:  . Weigh daily first thing in the morning, if weight gain of 3 pounds overnight or 5 pounds in 1 week, take 1 torsemide daily until weight returned to baseline  Initial goal documentation      Ashley [name] was given information about Chronic Care Management services today including:  1. CCM service includes personalized support from designated clinical staff supervised by her physician, including individualized plan of care and coordination with other care providers 2. 24/7 contact phone numbers for assistance for urgent and routine care needs. 3. Standard insurance, coinsurance, copays and deductibles apply for chronic care management only during months in which we provide at least 20 minutes of these services. Most insurances cover these services at 100%, however patients may be responsible for any copay, coinsurance and/or deductible if applicable. This service may help you avoid the need for more expensive face-to-face services. 4. Only one practitioner may furnish and bill the service in a calendar month. 5. The patient may stop CCM services at any time (effective at the end of the month) by phone call to the office staff.  Patient agreed to services and verbal consent obtained.   The patient verbalized understanding of  instructions provided today and agreed to receive a mailed copy of patient instruction and/or educational materials. Telephone follow up appointment with pharmacy team member scheduled for: 03/08/20 at 2:30 PM (telephone)  Ashley [name], PharmD Clinical Pharmacist [facility] [phone]   Cholesterol Content in  Foods Cholesterol is a waxy, fat-like substance that helps to carry fat in the blood. The body needs cholesterol in small amounts, but too much cholesterol can cause damage to the arteries and heart. Most people should eat less than 200 milligrams (mg) of cholesterol a day. Foods with cholesterol  Cholesterol is found in animal-based foods, such as meat, seafood, and dairy. Generally, low-fat dairy and lean meats have less cholesterol than full-fat dairy and fatty meats. The milligrams of cholesterol per serving (mg per serving) of common cholesterol-containing foods are listed below. Meat and other proteins  Egg -- one large whole egg has 186 mg.  Veal shank -- 4 oz has 141 mg.  Lean ground [place] (93% lean) -- 4 oz has 118 mg.  Fat-trimmed lamb loin -- 4 oz has 106 mg.  Lean ground beef (90% lean) -- 4 oz has 100 mg.  Lobster -- 3.5 oz has 90 mg.  Pork loin chops -- 4 oz has 86 mg.  Canned salmon -- 3.5 oz has 83 mg.  Fat-trimmed beef top loin -- 4 oz has 78 mg.  Frankfurter -- 1 frank (3.5 oz) has 77 mg.  Crab -- 3.5 oz has 71 mg.  Roasted chicken without skin, white meat -- 4 oz has 66 mg.  Light bologna -- 2 oz has 45 mg.  Deli-cut [place] -- 2 oz has 31 mg.  Canned tuna -- 3.5 oz has 31 mg.  Berniece Salines -- 1 oz has 29 mg.  Oysters and mussels (raw) -- 3.5 oz has 25 mg.  Mackerel -- 1 oz has 22 mg.  Trout -- 1 oz has 20 mg.  Pork sausage -- 1 link (1 oz) has 17 mg.  Salmon -- 1 oz has 16 mg.  Tilapia -- 1 oz has 14 mg. Dairy  Soft-serve ice cream --  cup (4 oz) has 103 mg.  Whole-milk yogurt -- 1 cup (8 oz) has 29 mg.  Cheddar cheese -- 1 oz has 28 mg.  American cheese -- 1 oz has 28 mg.  Whole milk -- 1 cup (8 oz) has 23 mg.  2% milk -- 1 cup (8 oz) has 18 mg.  Cream cheese -- 1 tablespoon (Tbsp) has 15 mg.  Cottage cheese --  cup (4 oz) has 14 mg.  Low-fat (1%) milk -- 1 cup (8 oz) has 10 mg.  Sour cream -- 1 Tbsp has 8.5 mg.  Low-fat yogurt  -- 1 cup (8 oz) has 8 mg.  Nonfat Greek yogurt -- 1 cup (8 oz) has 7 mg.  Half-and-half cream -- 1 Tbsp has 5 mg. Fats and oils  Cod liver oil -- 1 tablespoon (Tbsp) has 82 mg.  Butter -- 1 Tbsp has 15 mg.  Lard -- 1 Tbsp has 14 mg.  Bacon grease -- 1 Tbsp has 14 mg.  Mayonnaise -- 1 Tbsp has 5-10 mg.  Margarine -- 1 Tbsp has 3-10 mg. Exact amounts of cholesterol in these foods may vary depending on specific ingredients and brands. Foods without cholesterol Most plant-based foods do not have cholesterol unless you combine them with a food that has cholesterol. Foods without cholesterol include:  Grains and cereals.  Vegetables.  Fruits.  Vegetable oils, such as olive, canola, and sunflower oil.  Legumes, such as peas, beans, and lentils.  Nuts and seeds.  Egg whites. Summary  The body needs cholesterol in small amounts, but too much cholesterol can cause damage to the arteries and heart.  Most people should eat less than 200 milligrams (mg) of cholesterol a day. This information is not intended to replace advice given to you by your health care provider. Make sure you discuss any questions you have with your health care provider. Document Revised: 04/13/2017 Document Reviewed: 12/26/2016 Elsevier Patient Education  [place].

## 2019-09-08 ENCOUNTER — Telehealth: Payer: Self-pay | Admitting: *Deleted

## 2019-09-08 NOTE — Telephone Encounter (Signed)
Reviewed results with patient and she verbalized understanding with no further questions at this time.  

## 2019-09-08 NOTE — Telephone Encounter (Signed)
Left voicemail message to call back for results.  

## 2019-09-08 NOTE — Telephone Encounter (Signed)
Nystatin cream Last OV:  06/25/19, 4 mo f/u Next OV:  12/05/19, AWV prt 2

## 2019-09-08 NOTE — Telephone Encounter (Signed)
-----   Message from Minna Merritts, MD sent at 09/07/2019  4:50 PM EDT ----- LV function 30 to 35% Right ventricle moving well No significant valve disease No change from October 2020

## 2019-09-08 NOTE — Telephone Encounter (Signed)
Patient returning call.

## 2019-09-09 DIAGNOSIS — M25579 Pain in unspecified ankle and joints of unspecified foot: Secondary | ICD-10-CM | POA: Diagnosis not present

## 2019-09-09 DIAGNOSIS — R2689 Other abnormalities of gait and mobility: Secondary | ICD-10-CM | POA: Diagnosis not present

## 2019-09-10 ENCOUNTER — Ambulatory Visit
Admission: RE | Admit: 2019-09-10 | Discharge: 2019-09-10 | Disposition: A | Discharge status: Home / Self Care | Payer: Medicare Other | Source: Ambulatory Visit | Attending: Physician Assistant | Admitting: Physician Assistant

## 2019-09-10 ENCOUNTER — Other Ambulatory Visit: Payer: Self-pay

## 2019-09-10 ENCOUNTER — Other Ambulatory Visit
Admission: RE | Admit: 2019-09-10 | Discharge: 2019-09-10 | Disposition: A | Discharge status: Home / Self Care | Payer: Medicare Other | Source: Home / Self Care | Attending: Otolaryngology | Admitting: Otolaryngology

## 2019-09-10 DIAGNOSIS — H9201 Otalgia, right ear: Secondary | ICD-10-CM | POA: Insufficient documentation

## 2019-09-10 LAB — CREATININE, SERUM
Creatinine, Ser: 0.83 mg/dL (ref 0.44–1.00)
GFR calc Af Amer: >60 mL/min (ref >60)
GFR calc non Af Amer: >60 mL/min (ref >60)

## 2019-09-10 MED ORDER — IOHEXOL 300 MG/ML  SOLN
75.0000 mL | Freq: Once | INTRAMUSCULAR | Status: AC | PRN
  Administered 2019-09-10: 75 mL via INTRAVENOUS

## 2019-09-11 MED ORDER — NYSTATIN 100000 UNIT/GM EX CREA: 1.0000 "application " | TOPICAL_CREAM | CUTANEOUS | 3 refills | Status: DC | PRN

## 2019-09-15 DIAGNOSIS — M26629 Arthralgia of temporomandibular joint, unspecified side: Secondary | ICD-10-CM | POA: Diagnosis not present

## 2019-09-15 DIAGNOSIS — H903 Sensorineural hearing loss, bilateral: Secondary | ICD-10-CM | POA: Diagnosis not present

## 2019-09-15 NOTE — Progress Notes (Signed)
I have collaborated with the care management provider regarding care management and care coordination activities outlined in this encounter and have reviewed this encounter including documentation in the note and care plan. I am certifying that I agree with the content of this note and encounter as supervising physician.  

## 2019-09-16 DIAGNOSIS — R2689 Other abnormalities of gait and mobility: Secondary | ICD-10-CM | POA: Diagnosis not present

## 2019-09-16 DIAGNOSIS — M25579 Pain in unspecified ankle and joints of unspecified foot: Secondary | ICD-10-CM | POA: Diagnosis not present

## 2019-09-18 DIAGNOSIS — R2689 Other abnormalities of gait and mobility: Secondary | ICD-10-CM | POA: Diagnosis not present

## 2019-09-18 DIAGNOSIS — M25579 Pain in unspecified ankle and joints of unspecified foot: Secondary | ICD-10-CM | POA: Diagnosis not present
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
## 2019-09-22 ENCOUNTER — Ambulatory Visit (INDEPENDENT_AMBULATORY_CARE_PROVIDER_SITE_OTHER): Payer: Medicare Other | Admitting: Cardiovascular Disease

## 2019-09-22 ENCOUNTER — Encounter: Payer: Self-pay | Admitting: Cardiovascular Disease

## 2019-09-22 ENCOUNTER — Other Ambulatory Visit: Payer: Self-pay

## 2019-09-22 VITALS — BP 138/70 | HR 86 | Ht 65.0 in | Wt 176.4 lb

## 2019-09-22 DIAGNOSIS — E782 Mixed hyperlipidemia: Secondary | ICD-10-CM

## 2019-09-22 DIAGNOSIS — R55 Syncope and collapse: Secondary | ICD-10-CM

## 2019-09-22 DIAGNOSIS — F411 Generalized anxiety disorder: Secondary | ICD-10-CM | POA: Diagnosis not present

## 2019-09-22 DIAGNOSIS — I5022 Chronic systolic (congestive) heart failure: Secondary | ICD-10-CM

## 2019-09-22 DIAGNOSIS — I10 Essential (primary) hypertension: Secondary | ICD-10-CM | POA: Diagnosis not present

## 2019-09-22 DIAGNOSIS — I051 Rheumatic mitral insufficiency: Secondary | ICD-10-CM | POA: Diagnosis not present

## 2019-09-22 DIAGNOSIS — Z95810 Presence of automatic (implantable) cardiac defibrillator: Secondary | ICD-10-CM

## 2019-09-22 DIAGNOSIS — I700 Atherosclerosis of aorta: Secondary | ICD-10-CM | POA: Diagnosis not present

## 2019-09-22 DIAGNOSIS — I420 Dilated cardiomyopathy: Secondary | ICD-10-CM | POA: Diagnosis not present

## 2019-09-22 MED ORDER — ENTRESTO 49-51 MG PO TABS: 1.0000 | ORAL_TABLET | Freq: Two times a day (BID) | ORAL | 3 refills | Status: DC

## 2019-09-22 NOTE — Patient Instructions (Addendum)
Medication Instructions:   Please increase the entresto up to 49/51 mg twice a day Proceed very slowly,   If you need a refill on your cardiac medications before your next appointment, please call your pharmacy.    Lab work: No new labs needed   If you have labs (blood work) drawn today and your tests are completely normal, you will receive your results only by: Marland Kitchen MyChart Message (if you have MyChart) OR . A paper copy in the mail If you have any lab test that is abnormal or we need to change your treatment, we will call you to review the results.   Testing/Procedures: No new testing needed   Follow-Up: At [facility], you and your health needs are our priority.  As part of our continuing mission to provide you with exceptional heart care, we have created designated Provider Care Teams.  These Care Teams include your primary Cardiologist (physician) and Advanced Practice Providers (APPs -  Physician Assistants and Nurse Practitioners) who all work together to provide you with the care you need, when you need it.  . You will need a follow up appointment in 3 months  . Providers on your designated Care Team:   . Murray Hodgkins, NP . Christell Faith, PA-C . Marrianne Mood, PA-C  Any Other Special Instructions Will Be Listed Below (If Applicable).  For educational health videos Log in to : [email or web address] Or : [email or web address], password : [email or web address]

## 2019-09-23 ENCOUNTER — Telehealth: Payer: Self-pay | Admitting: *Deleted

## 2019-09-23 NOTE — Telephone Encounter (Signed)
-----   Message from Minna Merritts, MD sent at 09/22/2019  6:54 PM EDT ----- Can we sign her up  for cardiac rehab Thx TG

## 2019-09-23 NOTE — Telephone Encounter (Signed)
Spoke with patient and reviewed that provider wanted to order cardiac rehab. She states that she was going to call me back today because she didn't see it on her paperwork. Order placed for rehab and informed her that they would process order and would give her a call in about 2 weeks to set up her services. She verbalized understanding with no further questions at this time.

## 2019-09-30 ENCOUNTER — Encounter: Payer: Medicare Other | Attending: Cardiovascular Disease | Admitting: *Deleted

## 2019-09-30 ENCOUNTER — Other Ambulatory Visit: Payer: Self-pay

## 2019-09-30 ENCOUNTER — Telehealth: Payer: Self-pay | Admitting: Cardiovascular Disease

## 2019-09-30 DIAGNOSIS — I5022 Chronic systolic (congestive) heart failure: Secondary | ICD-10-CM | POA: Insufficient documentation

## 2019-09-30 NOTE — Telephone Encounter (Signed)
Pt c/o medication issue:  1. Name of Medication: losartan   2. How are you currently taking this medication (dosage and times per day)? Not sure  3. Are you having a reaction (difficulty breathing--STAT)? na 4. What is your medication issue? Patient cannot remember if taking or not please call

## 2019-09-30 NOTE — Telephone Encounter (Signed)
Spoke with patient and reviewed that she should not be taking Losartan because the Entresto replaced that. She then remembered our conversation from before and will write it down so as not to forget and take it in error. She was appreciative for the call back with no further questions at this time.

## 2019-09-30 NOTE — Progress Notes (Signed)
Completed virtual orientation today.  EP evaluation is scheduled for Thursday 5/19 at 1130.  Documentation for diagnosis can be found in [facility] encounter 09/22/19.

## 2019-10-01 ENCOUNTER — Encounter: Payer: Medicare Other | Admitting: *Deleted

## 2019-10-01 VITALS — Ht 66.3 in | Wt 178.6 lb

## 2019-10-01 DIAGNOSIS — I5022 Chronic systolic (congestive) heart failure: Secondary | ICD-10-CM

## 2019-10-01 NOTE — Progress Notes (Signed)
Cardiac Individual Treatment Plan  Patient Details  Name: Ashley [name] MRN: [id number] Date of Birth: Aug 20, 1945 Referring Provider:     Cardiac Rehab from 10/01/2019 in [facility] Cardiac and Pulmonary Rehab  Referring Provider  [name] MD      Initial Encounter Date:    Cardiac Rehab from 10/01/2019 in [facility] Cardiac and Pulmonary Rehab  Date  10/01/19      Visit Diagnosis: Heart failure, chronic systolic ([place])  Patient's Home Medications on Admission:  Current Outpatient Medications:  .  acetaminophen (TYLENOL) 500 MG tablet, Take 500 mg by mouth daily as needed for moderate pain or headache (PAIN). , Disp: , Rfl:  .  AMBULATORY NON FORMULARY MEDICATION, Medication Name:  1 squatty potty to use as needed, Disp: 1 each, Rfl: 0 .  Ascorbic Acid (VITAMIN C) 1000 MG tablet, Take 1,000 mg by mouth See admin instructions. Take 1000 mg daily, may take an additional 1000 mg as needed for cold symptoms, Disp: , Rfl:  .  aspirin EC 81 MG tablet, Take 81 mg by mouth at bedtime. , Disp: , Rfl:  .  beclomethasone (QVAR REDIHALER) 80 MCG/ACT inhaler, Inhale 2 puffs into the lungs 2 (two) times daily. Rinse mouth after use. (Patient not taking: Reported on 09/30/2019), Disp: 1 g, Rfl: 0 .  benzonatate (TESSALON) 200 MG capsule, Take 1 capsule (200 mg total) by mouth 3 (three) times daily as needed for cough., Disp: 30 capsule, Rfl: 3 .  BIOTIN PO, Take 1 tablet by mouth daily., Disp: , Rfl:  .  carvedilol (COREG) 3.125 MG tablet, Take 1 tablet (3.125 mg total) by mouth 2 (two) times daily with a meal. (Patient taking differently: Take 3.125 mg by mouth 2 (two) times daily with a meal. 1/2 tablet BID), Disp: 180 tablet, Rfl: 3 .  cholecalciferol (VITAMIN D) 1000 units tablet, Take 1,000 Units by mouth daily. , Disp: , Rfl:  .  Coenzyme Q10 (COQ-10) 100 MG CAPS, Take 100 mg by mouth daily., Disp: , Rfl:  .  dextromethorphan (DELSYM) 30 MG/5ML liquid, Take 15 mg by mouth daily as needed for cough.  , Disp: , Rfl:  .  diclofenac Sodium (VOLTAREN) 1 % GEL, Apply 1 application topically 4 (four) times daily., Disp: , Rfl:  .  Diphenhyd-Hydrocort-Nystatin (FIRST-DUKES MOUTHWASH) SUSP, Swish and swallow 5 mLs every 6 (six) hours as needed (mouth blisters). (Patient not taking: Reported on 09/30/2019), Disp: 237 mL, Rfl: 0 .  ezetimibe (ZETIA) 10 MG tablet, Take 1 tablet (10 mg total) by mouth daily., Disp: 90 tablet, Rfl: 3 .  GARLIC PO, Take 1 capsule by mouth daily., Disp: , Rfl:  .  guaiFENesin (MUCINEX) 600 MG 12 hr tablet, Take 600 mg by mouth daily., Disp: , Rfl:  .  levalbuterol (XOPENEX HFA) 45 MCG/ACT inhaler, Inhale 2 puffs into the lungs every 4 (four) hours as needed for wheezing or shortness of breath. (Patient taking differently: Inhale 2 puffs into the lungs every 4 (four) hours as needed for wheezing or shortness of breath. ), Disp: 1 Inhaler, Rfl: 3 .  LORazepam (ATIVAN) 0.5 MG tablet, Take 0.5-1 tablets (0.25-0.5 mg total) by mouth at bedtime as needed for anxiety or sleep. (Patient not taking: Reported on 09/30/2019), Disp: 30 tablet, Rfl: 1 .  Menthol, Topical Analgesic, (BIOFREEZE ROLL-ON EX), Apply topically., Disp: , Rfl:  .  miconazole (MICOTIN) 2 % powder, Apply 1 application topically as needed (yeast)., Disp: , Rfl:  .  nystatin cream (MYCOSTATIN), Apply  1 application topically as needed for dry skin (yeast infection)., Disp: 30 g, Rfl: 3 .  potassium chloride (KLOR-CON) 10 MEQ tablet, Take 1 tablet (10 mEq total) by mouth daily as needed (with laxis)., Disp: 90 tablet, Rfl: 3 .  pyridOXINE (VITAMIN B-6) 100 MG tablet, Take 100 mg by mouth daily., Disp: , Rfl:  .  Red Yeast Rice Extract (RED YEAST RICE PO), Take 1 capsule by mouth in the morning and at bedtime. , Disp: , Rfl:  .  sacubitril-valsartan (ENTRESTO) 49-51 MG, Take 1 tablet by mouth 2 (two) times daily., Disp: 180 tablet, Rfl: 3 .  spironolactone (ALDACTONE) 25 MG tablet, Take 0.5 tablets (12.5 mg total) by mouth  daily for 30 days. (Patient taking differently: Take 25 mg by mouth daily. 1 tablet every other day), Disp: 15 tablet, Rfl: 0 .  torsemide (DEMADEX) 10 MG tablet, Take 1 tablet (10 mg total) by mouth every other day., Disp: 45 tablet, Rfl: 3 .  triamcinolone (NASACORT) 55 MCG/ACT AERO nasal inhaler, Place 2 sprays into the nose daily., Disp: , Rfl:  .  vitamin E 400 UNIT capsule, Take 400 Units by mouth daily., Disp: , Rfl:   Past Medical History: Past Medical History:  Diagnosis Date  . Abdominal aortic atherosclerosis ([place]) 11/2015   by CT  . Allergy   . Anemia   . Anxiety   . Automatic implantable cardioverter-defibrillator in situ 2012   a. MDT single lead ICD.  Marland Kitchen Bronchiectasis    a. possibly mild, treat URIs aggressively  . Cancer (HCC)    basal cell, arms & neck  . CHF (congestive heart failure) ([place])   . Chronic sinusitis   . COPD (chronic obstructive pulmonary disease) ([place])   . Depression   . Dyspnea   . Essential hypertension   . Fibrocystic breast disease   . Fibromyalgia   . GERD (gastroesophageal reflux disease)   . H/O multiple allergies 10/10/2013  . Hemorrhoids   . History of diverticulitis of colon   . History of pneumonia 2012  . Insomnia   . Irritable bowel syndrome   . Laryngeal nodule    Dr. Thomasena Edis  . Migraine without aura, without mention of intractable migraine without mention of status migrainosus   . Mitral regurgitation   . Nonischemic cardiomyopathy ([place])    a. Recovered LV fxn - prev as low as 25%, now 50-55% by echo 04/2014;  b. 2012 s/p MDT [id number] [place] VR single lead ICD.  Marland Kitchen Osteoarthritis    knees, back, hands, low back,   . Osteoporosis 01/2013   T-2.7 spine (01/2016)  . PONV (postoperative nausea and vomiting)   . sigmoid diverticulitis with perforation, abscess and fistula 09/30/2013  . Sleep apnea 2010   borderline, inconclusive- /w [place]     Tobacco Use: Social History   Tobacco Use  Smoking Status Never Smoker  Smokeless  Tobacco Never Used  Tobacco Comment   Lived with smokers x 23 yrs, smoked herself "for a week"    Labs: Recent Review Flowsheet Data    Labs for ITP Cardiac and Pulmonary Rehab Latest Ref Rng & Units 11/09/2017 10/16/2018 10/16/2018 11/27/2018 03/18/2019   Cholestrol 0 - 200 mg/dL 197 - - 231(H) 217(H)   LDLCALC 0 - 99 mg/dL - - - - 131(H)   LDLDIRECT mg/dL 124.0 - - 164.0 -   HDL >39.00 mg/dL 45.30 - - 45.20 46.60   Trlycerides 0.0 - 149.0 mg/dL 223.0(H) - - 264.0(H) 199.0(H)  Hemoglobin A1c 4.6 - 6.5 % - - - 5.9 -   PHART 7.350 - 7.450 - 7.447 - - -   PCO2ART 32.0 - 48.0 mmHg - 40.1 - - -   HCO3 20.0 - 28.0 mmol/L - 27.6 26.2 - -   TCO2 22 - 32 mmol/L - 29 27 - -   O2SAT % - 95.0 63.0 - -       Exercise Target Goals: Exercise Program Goal: Individual exercise prescription set using results from initial 6 min walk test and THRR while considering  patient's activity barriers and safety.   Exercise Prescription Goal: Initial exercise prescription builds to 30-45 minutes a day of aerobic activity, 2-3 days per week.  Home exercise guidelines will be given to patient during program as part of exercise prescription that the participant will acknowledge.   Education: Aerobic Exercise & Resistance Training: - Gives group verbal and written instruction on the various components of exercise. Focuses on aerobic and resistive training programs and the benefits of this training and how to safely progress through these programs..   Education: Exercise & Equipment Safety: - Individual verbal instruction and demonstration of equipment use and safety with use of the equipment.   Cardiac Rehab from 10/01/2019 in [facility] Cardiac and Pulmonary Rehab  Date  10/01/19  Educator  [facility]  Instruction Review Code  1- Verbalizes Understanding      Education: Exercise Physiology & General Exercise Guidelines: - Group verbal and written instruction with models to review the exercise physiology of the  cardiovascular system and associated critical values. Provides general exercise guidelines with specific guidelines to those with heart or lung disease.    Education: Flexibility, Balance, Mind/Body Relaxation: Provides group verbal/written instruction on the benefits of flexibility and balance training, including mind/body exercise modes such as yoga, pilates and tai chi.  Demonstration and skill practice provided.   Activity Barriers & Risk Stratification: Activity Barriers & Cardiac Risk Stratification - 09/30/19 1417      Activity Barriers & Cardiac Risk Stratification   Activity Barriers  Arthritis;Muscular Weakness;Deconditioning;Shortness of Breath;Decreased Ventricular Function;Other (comment);Neck/Spine Problems;Back Problems;Joint Problems;Balance Concerns;History of Falls    Comments  neuropathy ins hands and feet, knees can be bothersome, arthritis in neck, calified disc, IBS issues, bending over causes SOB and fatigue    Cardiac Risk Stratification  High       6 Minute Walk: 6 Minute Walk    Row Name 10/01/19 1434         6 Minute Walk   Phase  Initial     Distance  870 feet     Walk Time  6 minutes     # of Rest Breaks  0     MPH  1.65     METS  2.12     RPE  13     Perceived Dyspnea   2     VO2 Peak  7.43     Symptoms  Yes (comment)     Comments  tired, SOB, shuffling gait (uses walking stick)     Resting HR  94 bpm     Resting BP  122/70     Resting Oxygen Saturation   96 %     Exercise Oxygen Saturation  during 6 min walk  98 %     Max Ex. HR  107 bpm     Max Ex. BP  156/70     2 Minute Post BP  134/70  Oxygen Initial Assessment:   Oxygen Re-Evaluation:   Oxygen Discharge (Final Oxygen Re-Evaluation):   Initial Exercise Prescription: Initial Exercise Prescription - 10/01/19 1400      Date of Initial Exercise RX and Referring Provider   Date  10/01/19    Referring Provider  [name] MD      Treadmill   MPH  1.5    Grade  0     Minutes  15    METs  2.15      Recumbant Bike   Level  1    Watts  12    Minutes  15    METs  2      Arm Ergometer   Level  1    Watts  25    RPM  25    Minutes  15    METs  2      REL-XR   Level  1    Speed  50    Minutes  15    METs  2      Prescription Details   Frequency (times per week)  3    Duration  Progress to 30 minutes of continuous aerobic without signs/symptoms of physical distress      Intensity   THRR 40-80% of Max Heartrate  115-136    Ratings of Perceived Exertion  11-13    Perceived Dyspnea  0-4      Progression   Progression  Continue to progress workloads to maintain intensity without signs/symptoms of physical distress.      Resistance Training   Training Prescription  Yes    Weight  3 lb    Reps  10-15       Perform Capillary Blood Glucose checks as needed.  Exercise Prescription Changes: Exercise Prescription Changes    Row Name 10/01/19 1400             Response to Exercise   Blood Pressure (Admit)  122/70       Blood Pressure (Exercise)  156/70       Blood Pressure (Exit)  134/70       Heart Rate (Admit)  94 bpm       Heart Rate (Exercise)  107 bpm       Heart Rate (Exit)  97 bpm       Oxygen Saturation (Admit)  96 %       Oxygen Saturation (Exercise)  98 %       Rating of Perceived Exertion (Exercise)  13       Perceived Dyspnea (Exercise)  2       Symptoms  tired, SOB, shuffling gait, using walking stick       Comments  walk test results          Exercise Comments:   Exercise Goals and Review: Exercise Goals    Row Name 10/01/19 1437             Exercise Goals   Increase Physical Activity  Yes       Intervention  Provide advice, education, support and counseling about physical activity/exercise needs.;Develop an individualized exercise prescription for aerobic and resistive training based on initial evaluation findings, risk stratification, comorbidities and participant's personal goals.       Expected  Outcomes  Short Term: Attend rehab on a regular basis to increase amount of physical activity.;Long Term: Add in home exercise to make exercise part of routine and to increase amount of physical activity.;Long Term: Exercising regularly  at least 3-5 days a week.       Increase Strength and Stamina  Yes       Intervention  Provide advice, education, support and counseling about physical activity/exercise needs.;Develop an individualized exercise prescription for aerobic and resistive training based on initial evaluation findings, risk stratification, comorbidities and participant's personal goals.       Expected Outcomes  Short Term: Perform resistance training exercises routinely during rehab and add in resistance training at home;Short Term: Increase workloads from initial exercise prescription for resistance, speed, and METs.;Long Term: Improve cardiorespiratory fitness, muscular endurance and strength as measured by increased METs and functional capacity (6MWT)       Able to understand and use rate of perceived exertion (RPE) scale  Yes       Intervention  Provide education and explanation on how to use RPE scale       Expected Outcomes  Short Term: Able to use RPE daily in rehab to express subjective intensity level;Long Term:  Able to use RPE to guide intensity level when exercising independently       Able to understand and use Dyspnea scale  Yes       Intervention  Provide education and explanation on how to use Dyspnea scale       Expected Outcomes  Short Term: Able to use Dyspnea scale daily in rehab to express subjective sense of shortness of breath during exertion;Long Term: Able to use Dyspnea scale to guide intensity level when exercising independently       Knowledge and understanding of Target Heart Rate Range (THRR)  Yes       Intervention  Provide education and explanation of THRR including how the numbers were predicted and where they are located for reference       Expected Outcomes   Short Term: Able to state/look up THRR;Short Term: Able to use daily as guideline for intensity in rehab;Long Term: Able to use THRR to govern intensity when exercising independently       Able to check pulse independently  Yes       Intervention  Provide education and demonstration on how to check pulse in carotid and radial arteries.;Review the importance of being able to check your own pulse for safety during independent exercise       Expected Outcomes  Short Term: Able to explain why pulse checking is important during independent exercise;Long Term: Able to check pulse independently and accurately       Understanding of Exercise Prescription  Yes       Intervention  Provide education, explanation, and written materials on patient's individual exercise prescription       Expected Outcomes  Short Term: Able to explain program exercise prescription;Long Term: Able to explain home exercise prescription to exercise independently          Exercise Goals Re-Evaluation :   Discharge Exercise Prescription (Final Exercise Prescription Changes): Exercise Prescription Changes - 10/01/19 1400      Response to Exercise   Blood Pressure (Admit)  122/70    Blood Pressure (Exercise)  156/70    Blood Pressure (Exit)  134/70    Heart Rate (Admit)  94 bpm    Heart Rate (Exercise)  107 bpm    Heart Rate (Exit)  97 bpm    Oxygen Saturation (Admit)  96 %    Oxygen Saturation (Exercise)  98 %    Rating of Perceived Exertion (Exercise)  13    Perceived Dyspnea (Exercise)  2    Symptoms  tired, SOB, shuffling gait, using walking stick    Comments  walk test results       Nutrition:  Target Goals: Understanding of nutrition guidelines, daily intake of sodium <1574m, cholesterol <2018m calories 30% from fat and 7% or less from saturated fats, daily to have 5 or more servings of fruits and vegetables.  Education: Controlling Sodium/Reading Food Labels -Group verbal and written material supporting the  discussion of sodium use in heart healthy nutrition. Review and explanation with models, verbal and written materials for utilization of the food label.   Education: General Nutrition Guidelines/Fats and Fiber: -Group instruction provided by verbal, written material, models and posters to present the general guidelines for heart healthy nutrition. Gives an explanation and review of dietary fats and fiber.   Biometrics: Pre Biometrics - 10/01/19 1437      Pre Biometrics   Height  5' 6.3" (1.684 m)    Weight  178 lb 9.6 oz (81 kg)    BMI (Calculated)  28.57    Single Leg Stand  5.66 seconds        Nutrition Therapy Plan and Nutrition Goals:   Nutrition Assessments: Nutrition Assessments - 10/01/19 1438      MEDFICTS Scores   Pre Score  48       MEDIFICTS Score Key:          ?70 Need to make dietary changes          40-70 Heart Healthy Diet         ? 40 Therapeutic Level Cholesterol Diet  Nutrition Goals Re-Evaluation:   Nutrition Goals Discharge (Final Nutrition Goals Re-Evaluation):   Psychosocial: Target Goals: Acknowledge presence or absence of significant depression and/or stress, maximize coping skills, provide positive support system. Participant is able to verbalize types and ability to use techniques and skills needed for reducing stress and depression.   Education: Depression - Provides group verbal and written instruction on the correlation between heart/lung disease and depressed mood, treatment options, and the stigmas associated with seeking treatment.   Education: Sleep Hygiene -Provides group verbal and written instruction about how sleep can affect your health.  Define sleep hygiene, discuss sleep cycles and impact of sleep habits. Review good sleep hygiene tips.     Education: Stress and Anxiety: - Provides group verbal and written instruction about the health risks of elevated stress and causes of high stress.  Discuss the correlation between  heart/lung disease and anxiety and treatment options. Review healthy ways to manage with stress and anxiety.    Initial Review & Psychosocial Screening: Initial Psych Review & Screening - 09/30/19 1423      Initial Review   Current issues with  Current Sleep Concerns;Current Depression;Current Psychotropic Meds;Current Stress Concerns    Source of Stress Concerns  Chronic Illness;Family    Comments  Constantly dealing with health, does not sleep well and will nap during day,  feels well managed on current medication, [place] them as much as they would like to, not on their radar      [employer] [place] Yes   husband   Comments  [place]'t [place] them as much as they would like to, not on their radar (grandkids 8th grade and high school)      Barriers   Psychosocial barriers to participate in program  The patient  should benefit from training in stress management and relaxation.;Psychosocial barriers identified (see note)      Screening Interventions   Interventions  Encouraged to exercise;Provide feedback about the scores to participant;To provide support and resources with identified psychosocial needs    Expected Outcomes  Short Term goal: Utilizing psychosocial counselor, staff and physician to assist with identification of specific Stressors or current issues interfering with healing process. Setting desired goal for each stressor or current issue identified.;Long Term Goal: Stressors or current issues are controlled or eliminated.;Short Term goal: Identification and review with participant of any Quality of Life or Depression concerns found by scoring the questionnaire.;Long Term goal: The participant improves quality of Life and PHQ9 Scores as seen by post scores and/or verbalization of changes       Quality of Life Scores:  Quality of Life - 10/01/19 1437      Quality of Life   Select   Quality of Life      Quality of Life Scores   Health/Function Pre  8.07 %    Socioeconomic Pre  21.57 %    Psych/Spiritual Pre  17.79 %    Family Pre  18 %    GLOBAL Pre  14.37 %      Scores of 19 and below usually indicate a poorer quality of life in these areas.  A difference of  2-3 points is a clinically meaningful difference.  A difference of 2-3 points in the total score of the Quality of Life Index has been associated with significant improvement in overall quality of life, self-image, physical symptoms, and general health in studies assessing change in quality of life.  PHQ-9: Recent Review Flowsheet Data    Depression screen [facility] 2/9 10/01/2019 11/27/2018 11/09/2017 05/23/2016   Decreased Interest 2 0 0 0   Down, Depressed, Hopeless _0 0   PHQ - 2 Score _1 0   Altered sleeping 3 - 2 -   Tired, decreased energy 3 - 3 -   Change in appetite 0 - 1 -   Feeling bad or failure about yourself  0 - 0 -   Trouble concentrating 1 - 0 -   Moving slowly or fidgety/restless 0 - 0 -   Suicidal thoughts 0 - 0 -   PHQ-9 Score 11 - 7 -   Difficult doing work/chores Very difficult - Very difficult -     Interpretation of Total Score  Total Score Depression Severity:  1-4 = Minimal depression, 5-9 = Mild depression, 10-14 = Moderate depression, 15-19 = Moderately severe depression, 20-27 = Severe depression   Psychosocial Evaluation and Intervention: Psychosocial Evaluation - 09/30/19 1431      Psychosocial Evaluation & Interventions   Interventions  Stress management education;Encouraged to exercise with the program and follow exercise prescription    Comments  Charli is coming into rehab for a second time to boost her EF from her heart failure. She enjoyed it last time and is looking forward to doing it again.  She has a history of depression but is currently feeling well managed for her mood, but she is not sleeping well and wants to talk with her doctor about possibly changing her  medication to one that would also help her sleep better.  She is often up at night from pain and trips to bathroom, but she does nap most afternoons.  She is frustrated with her son and his family as they do not see them as much as  she would like.  Her son lives in [place], and she used to care for her grandchildren all through their early years and now they rarely see them.  She has an odd reaction to most new medications and has to be careful whenever starting anything new as she does not know how her body witll respond.  He husband is her primary support system but also their neighbors.  She enjoys gardening and helping keep up the house.  Her biggest goal is to improve her EF again and to try to decrease some of her pain, if possible.    Expected Outcomes  Short: Attend rehab for exercise to strengthen heart and boost stamina.  Long: Exercise to help improve sleep.    Continue Psychosocial Services   Follow up required by staff       Psychosocial Re-Evaluation:   Psychosocial Discharge (Final Psychosocial Re-Evaluation):   Vocational Rehabilitation: Provide vocational rehab assistance to qualifying candidates.   Vocational Rehab Evaluation & Intervention: Vocational Rehab - 09/30/19 1423      Initial Vocational Rehab Evaluation & Intervention   Assessment shows need for Vocational Rehabilitation  No       Education: Education Goals: Education classes will be provided on a variety of topics geared toward better understanding of heart health and risk factor modification. Participant will state understanding/return demonstration of topics presented as noted by education test scores.  Learning Barriers/Preferences: Learning Barriers/Preferences - 09/30/19 1417      Learning Barriers/Preferences   Learning Barriers  --   arthritis and trigger finger can cause her to go slowly   Learning Preferences  Skilled Demonstration       General Cardiac Education Topics:  AED/CPR: - Group  verbal and written instruction with the use of models to demonstrate the basic use of the AED with the basic ABC's of resuscitation.   Anatomy & Physiology of the Heart: - Group verbal and written instruction and models provide basic cardiac anatomy and physiology, with the coronary electrical and arterial systems. Review of Valvular disease and Heart Failure   Cardiac Procedures: - Group verbal and written instruction to review commonly prescribed medications for heart disease. Reviews the medication, class of the drug, and side effects. Includes the steps to properly store meds and maintain the prescription regimen. (beta blockers and nitrates)   Cardiac Medications I: - Group verbal and written instruction to review commonly prescribed medications for heart disease. Reviews the medication, class of the drug, and side effects. Includes the steps to properly store meds and maintain the prescription regimen.   Cardiac Medications II: -Group verbal and written instruction to review commonly prescribed medications for heart disease. Reviews the medication, class of the drug, and side effects. (all other drug classes)    Go Sex-Intimacy & Heart Disease, Get SMART - Goal Setting: - Group verbal and written instruction through game format to discuss heart disease and the return to sexual intimacy. Provides group verbal and written material to discuss and apply goal setting through the application of the S.M.A.R.T. Method.   Other Matters of the Heart: - Provides group verbal, written materials and models to describe Stable Angina and Peripheral Artery. Includes description of the disease process and treatment options available to the cardiac patient.   Infection Prevention: - Provides verbal and written material to individual with discussion of infection control including proper hand washing and proper equipment cleaning during exercise session.   Cardiac Rehab from 10/01/2019 in [facility] Cardiac  and Pulmonary Rehab  Date  10/01/19  Educator  [facility]  Instruction Review Code  1- Verbalizes Understanding      Falls Prevention: - Provides verbal and written material to individual with discussion of falls prevention and safety.   Cardiac Rehab from 10/01/2019 in [facility] Cardiac and Pulmonary Rehab  Date  10/01/19  Educator  [facility]  Instruction Review Code  1- Verbalizes Understanding      Other: -Provides group and verbal instruction on various topics (see comments)   Knowledge Questionnaire Score: Knowledge Questionnaire Score - 10/01/19 1438      Knowledge Questionnaire Score   Pre Score  20/26 Education Focus: Exercise, Nutrition, Anatomy       Core Components/Risk Factors/Patient Goals at Admission: Personal Goals and Risk Factors at Admission - 10/01/19 1438      Core Components/Risk Factors/Patient Goals on Admission    Weight Management  Yes;Weight Loss    Intervention  Weight Management: Develop a combined nutrition and exercise program designed to reach desired caloric intake, while maintaining appropriate intake of nutrient and fiber, sodium and fats, and appropriate energy expenditure required for the weight goal.;Weight Management: Provide education and appropriate resources to help participant work on and attain dietary goals.    Admit Weight  178 lb 9.6 oz (81 kg)    Goal Weight: Short Term  173 lb (78.5 kg)    Goal Weight: Long Term  168 lb (76.2 kg)    Expected Outcomes  Short Term: Continue to assess and modify interventions until short term weight is achieved;Long Term: Adherence to nutrition and physical activity/exercise program aimed toward attainment of established weight goal;Weight Loss: Understanding of general recommendations for a balanced deficit meal plan, which promotes 1-2 lb weight loss per week and includes a negative energy balance of [phone] kcal/d;Understanding recommendations for meals to include 15-35% energy as protein, 25-35% energy from fat,  35-60% energy from carbohydrates, less than 254m of dietary cholesterol, 20-35 gm of total fiber daily;Understanding of distribution of calorie intake throughout the day with the consumption of 4-5 meals/snacks    Heart Failure  Yes    Intervention  Provide a combined exercise and nutrition program that is supplemented with education, support and counseling about heart failure. Directed toward relieving symptoms such as shortness of breath, decreased exercise tolerance, and extremity edema.    Expected Outcomes  Improve functional capacity of life;Short term: Attendance in program 2-3 days a week with increased exercise capacity. Reported lower sodium intake. Reported increased fruit and vegetable intake. Reports medication compliance.;Short term: Daily weights obtained and reported for increase. Utilizing diuretic protocols set by physician.;Long term: Adoption of self-care skills and reduction of barriers for early signs and symptoms recognition and intervention leading to self-care maintenance.    Lipids  Yes    Intervention  Provide education and support for participant on nutrition & aerobic/resistive exercise along with prescribed medications to achieve LDL <748m HDL >4030m   Expected Outcomes  Short Term: Participant states understanding of desired cholesterol values and is compliant with medications prescribed. Participant is following exercise prescription and nutrition guidelines.;Long Term: Cholesterol controlled with medications as prescribed, with individualized exercise RX and with personalized nutrition plan. Value goals: LDL < 48m40mDL > 40 mg.       Education:Diabetes - Individual verbal and written instruction to review signs/symptoms of diabetes, desired ranges of glucose level fasting, after meals and with exercise. Acknowledge that pre and post exercise glucose checks will be done for 3 sessions at entry of program.  Education: Know Your Numbers and Risk Factors: -Group  verbal and written instruction about important numbers in your health.  Discussion of what are risk factors and how they play a role in the disease process.  Review of Cholesterol, Blood Pressure, Diabetes, and BMI and the role they play in your overall health.   Core Components/Risk Factors/Patient Goals Review:    Core Components/Risk Factors/Patient Goals at Discharge (Final Review):    ITP Comments: ITP Comments    Row Name 09/30/19 1437 10/01/19 1434         ITP Comments  Completed virtual orientation today.  EP evaluation is scheduled for Thursday 5/19 at 1130.  Documentation for diagnosis can be found in [facility] encounter 09/22/19.  Completed 6MWT and gym orientation.  Initial ITP created and sent for review to Dr. Emily Filbert, Medical Director.         Comments: Initial ITP

## 2019-10-01 NOTE — Patient Instructions (Signed)
Patient Instructions  Patient Details  Name: Ashley [name] MRN: [id number] Date of Birth: 1945-06-01 Referring Provider:  Minna Merritts, MD  Below are your personal goals for exercise, nutrition, and risk factors. Our goal is to help you stay on track towards obtaining and maintaining these goals. We will be discussing your progress on these goals with you throughout the program.  Initial Exercise Prescription: Initial Exercise Prescription - 10/01/19 1400      Date of Initial Exercise RX and Referring Provider   Date  10/01/19    Referring Provider  [name] MD      Treadmill   MPH  1.5    Grade  0    Minutes  15    METs  2.15      Recumbant Bike   Level  1    Watts  12    Minutes  15    METs  2      Arm Ergometer   Level  1    Watts  25    RPM  25    Minutes  15    METs  2      REL-XR   Level  1    Speed  50    Minutes  15    METs  2      Prescription Details   Frequency (times per week)  3    Duration  Progress to 30 minutes of continuous aerobic without signs/symptoms of physical distress      Intensity   THRR 40-80% of Max Heartrate  115-136    Ratings of Perceived Exertion  11-13    Perceived Dyspnea  0-4      Progression   Progression  Continue to progress workloads to maintain intensity without signs/symptoms of physical distress.      Resistance Training   Training Prescription  Yes    Weight  3 lb    Reps  10-15       Exercise Goals: Frequency: Be able to perform aerobic exercise two to three times per week in program working toward 2-5 days per week of home exercise.  Intensity: Work with a perceived exertion of 11 (fairly light) - 15 (hard) while following your exercise prescription.  We will make changes to your prescription with you as you progress through the program.   Duration: Be able to do 30 to 45 minutes of continuous aerobic exercise in addition to a 5 minute warm-up and a 5 minute cool-down routine.   Nutrition  Goals: Your personal nutrition goals will be established when you do your nutrition analysis with the dietician.  The following are general nutrition guidelines to follow: Cholesterol < 200mg /day Sodium < 1500mg /day Fiber: Women over 50 yrs - 21 grams per day  Personal Goals: Personal Goals and Risk Factors at Admission - 10/01/19 1438      Core Components/Risk Factors/Patient Goals on Admission    Weight Management  Yes;Weight Loss    Intervention  Weight Management: Develop a combined nutrition and exercise program designed to reach desired caloric intake, while maintaining appropriate intake of nutrient and fiber, sodium and fats, and appropriate energy expenditure required for the weight goal.;Weight Management: Provide education and appropriate resources to help participant work on and attain dietary goals.    Admit Weight  178 lb 9.6 oz (81 kg)    Goal Weight: Short Term  173 lb (78.5 kg)    Goal Weight: Long Term  168 lb (76.2 kg)  Expected Outcomes  Short Term: Continue to assess and modify interventions until short term weight is achieved;Long Term: Adherence to nutrition and physical activity/exercise program aimed toward attainment of established weight goal;Weight Loss: Understanding of general recommendations for a balanced deficit meal plan, which promotes 1-2 lb weight loss per week and includes a negative energy balance of [phone] kcal/d;Understanding recommendations for meals to include 15-35% energy as protein, 25-35% energy from fat, 35-60% energy from carbohydrates, less than 200mg  of dietary cholesterol, 20-35 gm of total fiber daily;Understanding of distribution of calorie intake throughout the day with the consumption of 4-5 meals/snacks    Heart Failure  Yes    Intervention  Provide a combined exercise and nutrition program that is supplemented with education, support and counseling about heart failure. Directed toward relieving symptoms such as shortness of breath,  decreased exercise tolerance, and extremity edema.    Expected Outcomes  Improve functional capacity of life;Short term: Attendance in program 2-3 days a week with increased exercise capacity. Reported lower sodium intake. Reported increased fruit and vegetable intake. Reports medication compliance.;Short term: Daily weights obtained and reported for increase. Utilizing diuretic protocols set by physician.;Long term: Adoption of self-care skills and reduction of barriers for early signs and symptoms recognition and intervention leading to self-care maintenance.    Lipids  Yes    Intervention  Provide education and support for participant on nutrition & aerobic/resistive exercise along with prescribed medications to achieve LDL 70mg , HDL >40mg .    Expected Outcomes  Short Term: Participant states understanding of desired cholesterol values and is compliant with medications prescribed. Participant is following exercise prescription and nutrition guidelines.;Long Term: Cholesterol controlled with medications as prescribed, with individualized exercise RX and with personalized nutrition plan. Value goals: LDL < 70mg , HDL > 40 mg.       Tobacco Use Initial Evaluation: Social History   Tobacco Use  Smoking Status Never Smoker  Smokeless Tobacco Never Used  Tobacco Comment   Lived with smokers x 23 yrs, smoked herself "for a week"    Exercise Goals and Review: Exercise Goals    Row Name 10/01/19 1437             Exercise Goals   Increase Physical Activity  Yes       Intervention  Provide advice, education, support and counseling about physical activity/exercise needs.;Develop an individualized exercise prescription for aerobic and resistive training based on initial evaluation findings, risk stratification, comorbidities and participant's personal goals.       Expected Outcomes  Short Term: Attend rehab on a regular basis to increase amount of physical activity.;Long Term: Add in home exercise  to make exercise part of routine and to increase amount of physical activity.;Long Term: Exercising regularly at least 3-5 days a week.       Increase Strength and Stamina  Yes       Intervention  Provide advice, education, support and counseling about physical activity/exercise needs.;Develop an individualized exercise prescription for aerobic and resistive training based on initial evaluation findings, risk stratification, comorbidities and participant's personal goals.       Expected Outcomes  Short Term: Perform resistance training exercises routinely during rehab and add in resistance training at home;Short Term: Increase workloads from initial exercise prescription for resistance, speed, and METs.;Long Term: Improve cardiorespiratory fitness, muscular endurance and strength as measured by increased METs and functional capacity (6MWT)       Able to understand and use rate of perceived exertion (RPE) scale  Yes  Intervention  Provide education and explanation on how to use RPE scale       Expected Outcomes  Short Term: Able to use RPE daily in rehab to express subjective intensity level;Long Term:  Able to use RPE to guide intensity level when exercising independently       Able to understand and use Dyspnea scale  Yes       Intervention  Provide education and explanation on how to use Dyspnea scale       Expected Outcomes  Short Term: Able to use Dyspnea scale daily in rehab to express subjective sense of shortness of breath during exertion;Long Term: Able to use Dyspnea scale to guide intensity level when exercising independently       Knowledge and understanding of Target Heart Rate Range (THRR)  Yes       Intervention  Provide education and explanation of THRR including how the numbers were predicted and where they are located for reference       Expected Outcomes  Short Term: Able to state/look up THRR;Short Term: Able to use daily as guideline for intensity in rehab;Long Term: Able to use  THRR to govern intensity when exercising independently       Able to check pulse independently  Yes       Intervention  Provide education and demonstration on how to check pulse in carotid and radial arteries.;Review the importance of being able to check your own pulse for safety during independent exercise       Expected Outcomes  Short Term: Able to explain why pulse checking is important during independent exercise;Long Term: Able to check pulse independently and accurately       Understanding of Exercise Prescription  Yes       Intervention  Provide education, explanation, and written materials on patient's individual exercise prescription       Expected Outcomes  Short Term: Able to explain program exercise prescription;Long Term: Able to explain home exercise prescription to exercise independently          Copy of goals given to participant.

## 2019-10-02 DIAGNOSIS — D472 Monoclonal gammopathy: Secondary | ICD-10-CM | POA: Diagnosis not present

## 2019-10-03 ENCOUNTER — Other Ambulatory Visit: Payer: Self-pay

## 2019-10-03 ENCOUNTER — Encounter: Payer: Medicare Other | Admitting: *Deleted

## 2019-10-03 DIAGNOSIS — I5022 Chronic systolic (congestive) heart failure: Secondary | ICD-10-CM | POA: Diagnosis not present

## 2019-10-03 NOTE — Progress Notes (Signed)
Daily Session Note  Patient Details  Name: Ashley [name] MRN: [id number] Date of Birth: 1945-05-25 Referring Provider:     Cardiac Rehab from 10/01/2019 in [facility] Cardiac and Pulmonary Rehab  Referring Provider  [name] MD      Encounter Date: 10/03/2019  Check In: Session Check In - 10/03/19 1111      Check-In   Supervising physician immediately available to respond to emergencies  See telemetry face sheet for immediately available ER MD    Location  [facility]-Cardiac & Pulmonary Rehab    Staff Present  Renita Papa, RN BSN;Jessica Luan Pulling, MA, RCEP, CCRP, CCET;Joseph [place] RCP,RRT,BSRT    Virtual Visit  No    Medication changes reported      No    Fall or balance concerns reported     No    Warm-up and Cool-down  Performed on first and last piece of equipment    Resistance Training Performed  Yes    VAD Patient?  No    PAD/SET Patient?  No      Pain Assessment   Currently in Pain?  No/denies          Social History   Tobacco Use  Smoking Status Never Smoker  Smokeless Tobacco Never Used  Tobacco Comment   Lived with smokers x 23 yrs, smoked herself "for a week"    Goals Met:  Independence with exercise equipment Exercise tolerated well No report of cardiac concerns or symptoms Strength training completed today  Goals Unmet:  Not Applicable  Comments: First full day of exercise!  Patient was oriented to gym and equipment including functions, settings, policies, and procedures.  Patient's individual exercise prescription and treatment plan were reviewed.  All starting workloads were established based on the results of the 6 minute walk test done at initial orientation visit.  The plan for exercise progression was also introduced and progression will be customized based on patient's performance and goals.    Dr. Emily Filbert is Medical Director for [place] and [facility].

## 2019-10-06 ENCOUNTER — Other Ambulatory Visit: Payer: Self-pay

## 2019-10-06 ENCOUNTER — Encounter: Payer: Self-pay | Admitting: *Deleted

## 2019-10-06 ENCOUNTER — Encounter: Payer: Medicare Other | Admitting: *Deleted

## 2019-10-06 ENCOUNTER — Telehealth: Payer: Self-pay | Admitting: Cardiovascular Disease

## 2019-10-06 ENCOUNTER — Ambulatory Visit (INDEPENDENT_AMBULATORY_CARE_PROVIDER_SITE_OTHER): Payer: Medicare Other | Admitting: Cardiovascular Disease

## 2019-10-06 ENCOUNTER — Encounter: Payer: Self-pay | Admitting: Cardiovascular Disease

## 2019-10-06 VITALS — BP 90/60 | HR 75 | Ht 65.0 in | Wt 178.1 lb

## 2019-10-06 DIAGNOSIS — I5022 Chronic systolic (congestive) heart failure: Secondary | ICD-10-CM

## 2019-10-06 DIAGNOSIS — I420 Dilated cardiomyopathy: Secondary | ICD-10-CM | POA: Diagnosis not present

## 2019-10-06 DIAGNOSIS — I700 Atherosclerosis of aorta: Secondary | ICD-10-CM | POA: Diagnosis not present

## 2019-10-06 DIAGNOSIS — E782 Mixed hyperlipidemia: Secondary | ICD-10-CM | POA: Diagnosis not present

## 2019-10-06 DIAGNOSIS — Z95810 Presence of automatic (implantable) cardiac defibrillator: Secondary | ICD-10-CM | POA: Diagnosis not present

## 2019-10-06 DIAGNOSIS — I051 Rheumatic mitral insufficiency: Secondary | ICD-10-CM

## 2019-10-06 DIAGNOSIS — F411 Generalized anxiety disorder: Secondary | ICD-10-CM

## 2019-10-06 DIAGNOSIS — I10 Essential (primary) hypertension: Secondary | ICD-10-CM

## 2019-10-06 MED ORDER — SACUBITRIL-VALSARTAN 24-26 MG PO TABS: 1.0000 | ORAL_TABLET | Freq: Two times a day (BID) | ORAL | 3 refills | Status: DC

## 2019-10-06 NOTE — Patient Instructions (Addendum)
Letter to exclude you from [place] duty  Please continue cardiac rehab  Medication Instructions:  Please go back to lower dose entresto 24/26 mg twice a day   If you need a refill on your cardiac medications before your next appointment, please call your pharmacy.    Lab work: No new labs needed   If you have labs (blood work) drawn today and your tests are completely normal, you will receive your results only by: Marland Kitchen MyChart Message (if you have MyChart) OR . A paper copy in the mail If you have any lab test that is abnormal or we need to change your treatment, we will call you to review the results.   Testing/Procedures: No new testing needed   Follow-Up: At [facility], you and your health needs are our priority.  As part of our continuing mission to provide you with exceptional heart care, we have created designated Provider Care Teams.  These Care Teams include your primary Cardiologist (physician) and Advanced Practice Providers (APPs -  Physician Assistants and Nurse Practitioners) who all work together to provide you with the care you need, when you need it.  . You will need a follow up appointment in 3 months   . Providers on your designated Care Team:   . Murray Hodgkins, NP . Christell Faith, PA-C . Marrianne Mood, PA-C  Any Other Special Instructions Will Be Listed Below (If Applicable).  For educational health videos Log in to : [email or web address] Or : [email or web address], password : [email or web address]
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
## 2019-10-06 NOTE — Telephone Encounter (Signed)
Pt c/o BP issue: STAT if pt c/o blurred vision, one-sided weakness or slurred speech  1. What are your last 5 BP readings? 86/44, 74/52 at cardiac rehab down the hall  2. Are you having any other symptoms (ex. Dizziness, headache, blurred vision, passed out)? Swimmy headed, dizzy and feels like she needs to lay down  3. What is your BP issue? Low BP  At cardiac rehab and recently had increase of entresto

## 2019-10-06 NOTE — Telephone Encounter (Signed)
Spoke with Janett Billow at cardiac rehab and she reports that patient had some low blood pressure readings 74/46 then 80/50's range and was symptomatic. HR has remained steady in the 80's and reports of dizziness. Offered her appointment for later today at 3 pm today with Dr. [name] and she confirmed. Patient and rehab states that it could be from recent increase on her Entresto. Pt has been scheduled and will make provider aware as well.

## 2019-10-06 NOTE — Progress Notes (Signed)
Incomplete Session Note  Patient Details  Name: Ashley [name] MRN: [id number] Date of Birth: 09-Jan-1946 Referring Provider:     Cardiac Rehab from 10/01/2019 in [facility]  Referring Provider  [name] MD      [name] did not complete her rehab session. Her blood pressure was 80s/40s after water. Her doctor was contacted and patient scheduled appointment with him today. She left asymptomatic with instructions on blood pressure safety.

## 2019-10-08 ENCOUNTER — Other Ambulatory Visit: Payer: Self-pay

## 2019-10-08 ENCOUNTER — Encounter: Payer: Medicare Other | Admitting: *Deleted

## 2019-10-08 DIAGNOSIS — I5022 Chronic systolic (congestive) heart failure: Secondary | ICD-10-CM | POA: Diagnosis not present

## 2019-10-08 NOTE — Progress Notes (Signed)
Daily Session Note  Patient Details  Name: Ashley [name] MRN: [id number] Date of Birth: April 12, 1946 Referring Provider:     Cardiac Rehab from 10/01/2019 in [facility] Cardiac and Pulmonary Rehab  Referring Provider  [name] MD      Encounter Date: 10/08/2019  Check In: Session Check In - 10/08/19 1112      Check-In   Supervising physician immediately available to respond to emergencies  See telemetry face sheet for immediately available ER MD    Location  [facility]-Cardiac & Pulmonary Rehab    Staff Present  Renita Papa, RN Vickki Hearing, BA, ACSM CEP, Exercise Physiologist;Joseph Tessie Fass RCP,RRT,BSRT    Virtual Visit  No    Medication changes reported      Yes    Comments  Taking 1/2 of Entresto    Fall or balance concerns reported     No    Warm-up and Cool-down  Performed on first and last piece of equipment    Resistance Training Performed  Yes    VAD Patient?  No    PAD/SET Patient?  No      Pain Assessment   Currently in Pain?  No/denies          Social History   Tobacco Use  Smoking Status Never Smoker  Smokeless Tobacco Never Used  Tobacco Comment   Lived with smokers x 23 yrs, smoked herself "for a week"    Goals Met:  Independence with exercise equipment Exercise tolerated well No report of cardiac concerns or symptoms Strength training completed today  Goals Unmet:  Not Applicable  Comments: Pt able to follow exercise prescription today without complaint.  Will continue to monitor for progression.    Dr. Emily Filbert is Medical Director for [facility] and [facility].

## 2019-10-09 ENCOUNTER — Encounter: Payer: Self-pay | Admitting: Pulmonary Disease

## 2019-10-09 ENCOUNTER — Other Ambulatory Visit: Payer: Self-pay

## 2019-10-09 ENCOUNTER — Ambulatory Visit: Payer: Medicare Other | Admitting: Pulmonary Disease

## 2019-10-09 VITALS — BP 112/60 | HR 81 | Temp 97.1°F | Ht 65.0 in | Wt 179.0 lb

## 2019-10-09 DIAGNOSIS — J4520 Mild intermittent asthma, uncomplicated: Secondary | ICD-10-CM

## 2019-10-09 DIAGNOSIS — D803 Selective deficiency of immunoglobulin G [IgG] subclasses: Secondary | ICD-10-CM

## 2019-10-09 DIAGNOSIS — R0602 Shortness of breath: Secondary | ICD-10-CM

## 2019-10-09 DIAGNOSIS — I428 Other cardiomyopathies: Secondary | ICD-10-CM

## 2019-10-09 MED ORDER — ARNUITY ELLIPTA 100 MCG/ACT IN AEPB: 1.0000 | INHALATION_SPRAY | Freq: Every day | RESPIRATORY_TRACT | 11 refills | Status: DC

## 2019-10-09 NOTE — Patient Instructions (Signed)
We are going to get breathing tests.   We have switched Qvar for Arnuity Ellipta that is 1 inhalation daily.  Rinse your mouth well after you use it.   Continue using Xopenex (blue inhaler) only as needed for shortness of breath.

## 2019-10-09 NOTE — Progress Notes (Signed)
 Assessment & Plan:  1. Mild intermittent asthma without complication (Primary) - Pulmonary Function Test [facility] Only; Future  2. SOB (shortness of breath) - Pulmonary Function Test [facility] Only; Future  3. 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HCC)  4. Non-ischemic cardiomyopathy [facility])   Patient Instructions  We are going to get breathing tests.   We have switched Qvar  for Arnuity Ellipta  that is 1 inhalation daily.  Rinse your mouth well after you use it.   Continue using Xopenex  (blue inhaler) only as needed for shortness of breath.  Please note: late entry documentation due to logistical difficulties during COVID-19 pandemic. This note is filed for information purposes only, and is not intended to be used for billing, nor does it represent the full scope/nature of the visit in question. Please see any associated scanned media linked to date of encounter for additional pertinent information.  Subjective:    HPI: Ashley [name] is a 74 y.o. female presenting to the pulmonology clinic on 10/09/2019 with report of: Follow-up (Pt has had increased cough since the last visit- coughing up green sputum. She rarely uses her qvar  and has never used xopenex . )     Outpatient Encounter Medications as of 10/09/2019  Medication Sig Note   acetaminophen  (TYLENOL ) 500 MG tablet Take 250 mg by mouth daily as needed for moderate pain or headache (PAIN).    aspirin  EC 81 MG tablet Take 81 mg by mouth at bedtime.    Menthol , Topical Analgesic, (BIOFREEZE ROLL-ON EX) Apply 1 Application topically daily as needed (For shoulder pain).    miconazole  (MICOTIN) 2 % powder Apply 1 application  topically daily as needed (yeast infection on stomach).    [DISCONTINUED] AMBULATORY NON FORMULARY MEDICATION Medication Name:  1 squatty potty to use as needed    [DISCONTINUED] Ascorbic Acid  (VITAMIN C ) 1000 MG tablet Take 1,000 mg by mouth See admin instructions. Take 1000 mg daily, may take an additional 1000 mg as needed  for cold symptoms (Patient not taking: Reported on 01/10/2022)    [DISCONTINUED] beclomethasone (QVAR  REDIHALER) 80 MCG/ACT inhaler Inhale 2 puffs into the lungs 2 (two) times daily. Rinse mouth after use. 09/30/2019: Not taking regularly, uses about 5-6x month   [DISCONTINUED] benzonatate  (TESSALON ) 200 MG capsule Take 1 capsule (200 mg total) by mouth 3 (three) times daily as needed for cough.    [DISCONTINUED] carvedilol  (COREG ) 3.125 MG tablet Take 1 tablet (3.125 mg total) by mouth 2 (two) times daily with a meal. (Patient taking differently: Take 3.125 mg by mouth 2 (two) times daily with a meal. 1/2 tablet BID)    [DISCONTINUED] cholecalciferol  (VITAMIN D ) 1000 units tablet Take 1,000 Units by mouth daily.     [DISCONTINUED] Coenzyme Q10 (COQ-10) 100 MG CAPS Take 100 mg by mouth daily. (Patient not taking: Reported on 01/10/2022)    [DISCONTINUED] dextromethorphan  (DELSYM ) 30 MG/5ML liquid Take 15 mg by mouth daily as needed for cough.  (Patient not taking: Reported on 01/01/2022)    [DISCONTINUED] diclofenac  Sodium (VOLTAREN ) 1 % GEL Apply 1 application topically 4 (four) times daily.    [DISCONTINUED] Diphenhyd-Hydrocort -Nystatin  (FIRST-DUKES MOUTHWASH) SUSP Swish and swallow 5 mLs every 6 (six) hours as needed (mouth blisters). 09/30/2019: Hasn't used in about a year   [DISCONTINUED] ezetimibe  (ZETIA ) 10 MG tablet Take 1 tablet (10 mg total) by mouth daily.    [DISCONTINUED] GARLIC PO Take 1 capsule by mouth daily. (Patient not taking: Reported on 01/01/2022)    [DISCONTINUED] guaiFENesin  (MUCINEX ) 600 MG 12 hr  tablet Take 600 mg by mouth daily.    [DISCONTINUED] levalbuterol  (XOPENEX  HFA) 45 MCG/ACT inhaler Inhale 2 puffs into the lungs every 4 (four) hours as needed for wheezing or shortness of breath. (Patient taking differently: Inhale 2 puffs into the lungs every 4 (four) hours as needed for wheezing or shortness of breath. )    [DISCONTINUED] LORazepam  (ATIVAN ) 0.5 MG tablet Take 0.5-1 tablets  (0.25-0.5 mg total) by mouth at bedtime as needed for anxiety or sleep. 09/30/2019: Hasn't used in a year   [DISCONTINUED] nystatin  cream (MYCOSTATIN ) Apply 1 application topically as needed for dry skin (yeast infection).    [DISCONTINUED] potassium chloride  (KLOR-CON ) 10 MEQ tablet Take 1 tablet (10 mEq total) by mouth daily as needed (with laxis).    [DISCONTINUED] pyridOXINE  (VITAMIN B-6) 100 MG tablet Take 100 mg by mouth daily. (Patient not taking: Reported on 01/10/2022)    [DISCONTINUED] Red Yeast Rice Extract (RED YEAST RICE PO) Take 1 capsule by mouth in the morning and at bedtime. (Patient not taking: Reported on 01/10/2022)    [DISCONTINUED] sacubitril -valsartan  (ENTRESTO ) 24-26 MG Take 1 tablet by mouth 2 (two) times daily.    [DISCONTINUED] spironolactone  (ALDACTONE ) 25 MG tablet Take 0.5 tablets (12.5 mg total) by mouth daily for 30 days. (Patient taking differently: Take 25 mg by mouth daily. 1 tablet every other day)    [DISCONTINUED] torsemide  (DEMADEX ) 10 MG tablet Take 1 tablet (10 mg total) by mouth every other day.    [DISCONTINUED] triamcinolone  (NASACORT ) 55 MCG/ACT AERO nasal inhaler Place 2 sprays into the nose daily.    [DISCONTINUED] vitamin E  400 UNIT capsule Take 400 Units by mouth daily. (Patient not taking: Reported on 01/10/2022)    [DISCONTINUED] BIOTIN  PO Take 1 tablet by mouth daily.    [DISCONTINUED] bisoprolol  (ZEBETA ) 5 MG tablet Take 5 mg by mouth daily.    [DISCONTINUED] Fluticasone  Furoate (ARNUITY ELLIPTA ) 100 MCG/ACT AEPB Inhale 1 puff into the lungs daily. (Patient not taking: Reported on 01/08/2020)    No facility-administered encounter medications on file as of 10/09/2019.      Objective:   Vitals:   10/09/19 1041  BP: 112/60  Pulse: 81  Temp: (!) 97.1 F (36.2 C)  Height: 5' 5 (1.651 m)  Weight: 179 lb (81.2 kg)  SpO2: 96%  TempSrc: Temporal  BMI (Calculated): 29.79     Physical exam documentation is limited by delayed entry of information.

## 2019-10-10 ENCOUNTER — Encounter: Payer: Medicare Other | Admitting: *Deleted

## 2019-10-10 DIAGNOSIS — I5022 Chronic systolic (congestive) heart failure: Secondary | ICD-10-CM | POA: Diagnosis not present

## 2019-10-10 NOTE — Progress Notes (Signed)
Daily Session Note  Patient Details  Name: Ashley [name] MRN: [id number] Date of Birth: 1946-01-07 Referring Provider:     Cardiac Rehab from 10/01/2019 in [facility] Cardiac and Pulmonary Rehab  Referring Provider  [name] MD      Encounter Date: 10/10/2019  Check In: Session Check In - 10/10/19 1134      Check-In   Supervising physician immediately available to respond to emergencies  See telemetry face sheet for immediately available ER MD    Location  [facility]-Cardiac & Pulmonary Rehab    Staff Present  Heath Lark, RN, BSN, CCRP;Jessica [place], MA, RCEP, CCRP, CCET;Joseph [place] RCP,RRT,BSRT    Virtual Visit  No    Medication changes reported      No    Fall or balance concerns reported     No    Warm-up and Cool-down  Performed on first and last piece of equipment    Resistance Training Performed  Yes    VAD Patient?  No    PAD/SET Patient?  No      Pain Assessment   Currently in Pain?  No/denies          Social History   Tobacco Use  Smoking Status Never Smoker  Smokeless Tobacco Never Used  Tobacco Comment   Lived with smokers x 23 yrs, smoked herself "for a week"    Goals Met:  Independence with exercise equipment Exercise tolerated well No report of cardiac concerns or symptoms  Goals Unmet:  Not Applicable  Comments: Pt able to follow exercise prescription today without complaint.  Will continue to monitor for progression.    Dr. Emily Filbert is Medical Director for [place] and [facility].

## 2019-10-15 ENCOUNTER — Other Ambulatory Visit: Payer: Self-pay

## 2019-10-15 ENCOUNTER — Encounter: Payer: Medicare Other | Attending: Cardiovascular Disease | Admitting: *Deleted

## 2019-10-15 DIAGNOSIS — I5022 Chronic systolic (congestive) heart failure: Secondary | ICD-10-CM | POA: Diagnosis not present

## 2019-10-15 NOTE — Progress Notes (Signed)
Daily Session Note  Patient Details  Name: Ashley [name] MRN: [id number] Date of Birth: 10-16-1945 Referring Provider:     Cardiac Rehab from 10/01/2019 in [facility] Cardiac and Pulmonary Rehab  Referring Provider  [name] MD      Encounter Date: 10/15/2019  Check In: Session Check In - 10/15/19 1136      Check-In   Supervising physician immediately available to respond to emergencies  See telemetry face sheet for immediately available ER MD    Location  [facility]-Cardiac & Pulmonary Rehab    Staff Present  Nyoka Cowden, RN, BSN, MA;Meredith Sherryll Burger, RN Vickki Hearing, BA, ACSM CEP, Exercise Physiologist;Jessica Luan [place], MA, RCEP, CCRP, CCET    Virtual Visit  No    Medication changes reported      No    Fall or balance concerns reported     No    Tobacco Cessation  No Change    Warm-up and Cool-down  Performed on first and last piece of equipment    Resistance Training Performed  No    VAD Patient?  No    PAD/SET Patient?  No      Pain Assessment   Currently in Pain?  No/denies          Social History   Tobacco Use  Smoking Status Never Smoker  Smokeless Tobacco Never Used  Tobacco Comment   Lived with smokers x 23 yrs, smoked herself "for a week"    Goals Met:  Independence with exercise equipment Exercise tolerated well No report of cardiac concerns or symptoms  Goals Unmet:  Not Applicable  Comments: Pt able to follow exercise prescription today without complaint.  Will continue to monitor for progression.   Dr. Emily Filbert is Medical Director for [place] and [facility].

## 2019-10-17 ENCOUNTER — Other Ambulatory Visit: Payer: Self-pay

## 2019-10-17 ENCOUNTER — Encounter: Payer: Medicare Other | Admitting: *Deleted

## 2019-10-17 DIAGNOSIS — I5022 Chronic systolic (congestive) heart failure: Secondary | ICD-10-CM

## 2019-10-17 NOTE — Progress Notes (Signed)
Daily Session Note  Patient Details  Name: Ashley [name] MRN: [id number] Date of Birth: [birth year] Referring Provider:     Cardiac Rehab from 10/01/2019 in [facility] Cardiac and Pulmonary Rehab  Referring Provider  [name] MD      Encounter Date: 10/17/2019  Check In: Session Check In - 10/17/19 1110      Check-In   Supervising physician immediately available to respond to emergencies  See telemetry face sheet for immediately available ER MD    Location  [facility]-Cardiac & Pulmonary Rehab    Staff Present  Nyoka Cowden, RN, BSN, MA;Meredith Sherryll Burger, RN BSN;Melissa Tilford Pillar RDN, LDN    Virtual Visit  No    Medication changes reported      No    Fall or balance concerns reported     No    Tobacco Cessation  No Change    Warm-up and Cool-down  Performed on first and last piece of equipment    Resistance Training Performed  Yes    VAD Patient?  No    PAD/SET Patient?  No      Pain Assessment   Currently in Pain?  No/denies          Social History   Tobacco Use  Smoking Status Never Smoker  Smokeless Tobacco Never Used  Tobacco Comment   Lived with smokers x 23 yrs, smoked herself "for a week"    Goals Met:  Independence with exercise equipment Exercise tolerated well No report of cardiac concerns or symptoms  Goals Unmet:  Not Applicable  Comments: Pt able to follow exercise prescription today without complaint.  Will continue to monitor for progression.    Dr. Emily Filbert is Medical Director for [place] and [facility].

## 2019-10-22 ENCOUNTER — Other Ambulatory Visit: Payer: Self-pay | Admitting: Cardiovascular Disease

## 2019-10-23 ENCOUNTER — Telehealth: Payer: Self-pay

## 2019-10-23 ENCOUNTER — Telehealth: Payer: Self-pay | Admitting: Pulmonary Disease

## 2019-10-23 MED ORDER — NYSTATIN 100000 UNIT/ML MT SUSP: 5.0000 mL | Freq: Three times a day (TID) | OROMUCOSAL | 0 refills | Status: DC

## 2019-10-23 NOTE — Telephone Encounter (Signed)
Spoke to pt. She appreciated the rx.

## 2019-10-23 NOTE — Telephone Encounter (Signed)
Can Stop Arnuity .  Resum QVAR 2 puffs Twice daily  , brush/rinse and gargle after use.  Salt water gargles As needed   Will send to dr [name] to decide if she wants to change to another inhaler   Please contact office for sooner follow up if symptoms do not improve or worsen or seek emergency care

## 2019-10-23 NOTE — Telephone Encounter (Signed)
Spoke with pt, her symptoms of thrush have been going on for 4 days. She has been using the last bit of Dukes Magic Mouthwash that she has and cold sore ointment on her lips for the sores. She would like a medication to help with the thrush. She is still having trouble with the cough and it's interrupting her sleep at night. She wants to stay on the [place] but wants to decrease the frequency to every other day. She does wash her mouth out after every use but still ends up with thrush. Please advise. Dr. [name] in on vacation.

## 2019-10-23 NOTE — Telephone Encounter (Signed)
Called and spoke to pt and relayed below message.  Pt voiced her understanding and had no further questions.   Dr. [name], please advise on additional recommendations.

## 2019-10-23 NOTE — Telephone Encounter (Signed)
Pt left v/m that pulmonologist gave new inhaler which has caused thrush;pt has been using dukes mouthwash that she had with no results, thrush no better. Pt request different med possibly abx to get rid of thrush. Pt is going out of town 10/26/19 and request cb when med called in. Please advise. Pt seen 02/10/21as FU.

## 2019-10-23 NOTE — Telephone Encounter (Signed)
May try nystatin swish/swallow sent to pharmacy.

## 2019-10-27 NOTE — Telephone Encounter (Signed)
Recommend rinsing mouth with some baking soda in the water i.e. 1/4 teaspoon of baking soda in 4 to 6 ounces of water.  Use to rinse/gargle.

## 2019-10-27 NOTE — Telephone Encounter (Signed)
Lm for pt

## 2019-10-28 NOTE — Telephone Encounter (Signed)
Lm x2 for pt.  

## 2019-10-29 ENCOUNTER — Encounter: Payer: Self-pay | Admitting: *Deleted

## 2019-10-29 DIAGNOSIS — I5022 Chronic systolic (congestive) heart failure: Secondary | ICD-10-CM

## 2019-10-29 NOTE — Progress Notes (Signed)
Cardiac Individual Treatment Plan  Patient Details  Name: Ashley [name] MRN: [id number] Date of Birth: April 15, 1946 Referring Provider:     Cardiac Rehab from 10/01/2019 in [facility] Cardiac and Pulmonary Rehab  Referring Provider [name] MD      Initial Encounter Date:    Cardiac Rehab from 10/01/2019 in [facility] Cardiac and Pulmonary Rehab  Date 10/01/19      Visit Diagnosis: Heart failure, chronic systolic ([place])  Patient's Home Medications on Admission:  Current Outpatient Medications:  .  acetaminophen (TYLENOL) 500 MG tablet, Take 500 mg by mouth daily as needed for moderate pain or headache (PAIN). , Disp: , Rfl:  .  AMBULATORY NON FORMULARY MEDICATION, Medication Name:  1 squatty potty to use as needed, Disp: 1 each, Rfl: 0 .  Ascorbic Acid (VITAMIN C) 1000 MG tablet, Take 1,000 mg by mouth See admin instructions. Take 1000 mg daily, may take an additional 1000 mg as needed for cold symptoms, Disp: , Rfl:  .  aspirin EC 81 MG tablet, Take 81 mg by mouth at bedtime. , Disp: , Rfl:  .  benzonatate (TESSALON) 200 MG capsule, Take 1 capsule (200 mg total) by mouth 3 (three) times daily as needed for cough., Disp: 30 capsule, Rfl: 3 .  carvedilol (COREG) 3.125 MG tablet, Take 1 tablet (3.125 mg total) by mouth 2 (two) times daily with a meal. (Patient taking differently: Take 3.125 mg by mouth 2 (two) times daily with a meal. 1/2 tablet BID), Disp: 180 tablet, Rfl: 3 .  carvedilol (COREG) 6.25 MG tablet, TAKE 1 TABLET (6.25 MG TOTAL) BY MOUTH 2 (TWO) TIMES DAILY WITH A MEAL., Disp: 180 tablet, Rfl: 1 .  cholecalciferol (VITAMIN D) 1000 units tablet, Take 1,000 Units by mouth daily. , Disp: , Rfl:  .  Coenzyme Q10 (COQ-10) 100 MG CAPS, Take 100 mg by mouth daily., Disp: , Rfl:  .  dextromethorphan (DELSYM) 30 MG/5ML liquid, Take 15 mg by mouth daily as needed for cough. , Disp: , Rfl:  .  diclofenac Sodium (VOLTAREN) 1 % GEL, Apply 1 application topically 4 (four) times daily., Disp:  , Rfl:  .  ezetimibe (ZETIA) 10 MG tablet, Take 1 tablet (10 mg total) by mouth daily., Disp: 90 tablet, Rfl: 3 .  Fluticasone Furoate (ARNUITY ELLIPTA) 100 MCG/ACT AEPB, Inhale 1 puff into the lungs daily., Disp: 30 each, Rfl: 11 .  GARLIC PO, Take 1 capsule by mouth daily., Disp: , Rfl:  .  guaiFENesin (MUCINEX) 600 MG 12 hr tablet, Take 600 mg by mouth daily., Disp: , Rfl:  .  levalbuterol (XOPENEX HFA) 45 MCG/ACT inhaler, Inhale 2 puffs into the lungs every 4 (four) hours as needed for wheezing or shortness of breath. (Patient taking differently: Inhale 2 puffs into the lungs every 4 (four) hours as needed for wheezing or shortness of breath. ), Disp: 1 Inhaler, Rfl: 3 .  LORazepam (ATIVAN) 0.5 MG tablet, Take 0.5-1 tablets (0.25-0.5 mg total) by mouth at bedtime as needed for anxiety or sleep., Disp: 30 tablet, Rfl: 1 .  Menthol, Topical Analgesic, (BIOFREEZE ROLL-ON EX), Apply topically., Disp: , Rfl:  .  miconazole (MICOTIN) 2 % powder, Apply 1 application topically as needed (yeast)., Disp: , Rfl:  .  nystatin (MYCOSTATIN) 100000 UNIT/ML suspension, Take 5 mLs (500,000 Units total) by mouth 3 (three) times daily., Disp: 120 mL, Rfl: 0 .  nystatin cream (MYCOSTATIN), Apply 1 application topically as needed for dry skin (yeast infection)., Disp: 30 g,  Rfl: 3 .  potassium chloride (KLOR-CON) 10 MEQ tablet, Take 1 tablet (10 mEq total) by mouth daily as needed (with laxis)., Disp: 90 tablet, Rfl: 3 .  pyridOXINE (VITAMIN B-6) 100 MG tablet, Take 100 mg by mouth daily., Disp: , Rfl:  .  Red Yeast Rice Extract (RED YEAST RICE PO), Take 1 capsule by mouth in the morning and at bedtime. , Disp: , Rfl:  .  sacubitril-valsartan (ENTRESTO) 24-26 MG, Take 1 tablet by mouth 2 (two) times daily., Disp: 180 tablet, Rfl: 3 .  spironolactone (ALDACTONE) 25 MG tablet, Take 0.5 tablets (12.5 mg total) by mouth daily for 30 days. (Patient taking differently: Take 25 mg by mouth daily. 1 tablet every other day),  Disp: 15 tablet, Rfl: 0 .  torsemide (DEMADEX) 10 MG tablet, Take 1 tablet (10 mg total) by mouth every other day., Disp: 45 tablet, Rfl: 3 .  triamcinolone (NASACORT) 55 MCG/ACT AERO nasal inhaler, Place 2 sprays into the nose daily., Disp: , Rfl:  .  vitamin E 400 UNIT capsule, Take 400 Units by mouth daily., Disp: , Rfl:   Past Medical History: Past Medical History:  Diagnosis Date  . Abdominal aortic atherosclerosis ([place]) 11/2015   by CT  . Allergy   . Anemia   . Anxiety   . Automatic implantable cardioverter-defibrillator in situ 2012   a. MDT single lead ICD.  Marland Kitchen Bronchiectasis    a. possibly mild, treat URIs aggressively  . Cancer (HCC)    basal cell, arms & neck  . CHF (congestive heart failure) ([place])   . Chronic sinusitis   . COPD (chronic obstructive pulmonary disease) ([place])   . Depression   . Dyspnea   . Essential hypertension   . Fibrocystic breast disease   . Fibromyalgia   . GERD (gastroesophageal reflux disease)   . H/O multiple allergies 10/10/2013  . Hemorrhoids   . History of diverticulitis of colon   . History of pneumonia 2012  . Insomnia   . Irritable bowel syndrome   . Laryngeal nodule    Dr. Thomasena Edis  . Migraine without aura, without mention of intractable migraine without mention of status migrainosus   . Mitral regurgitation   . Nonischemic cardiomyopathy ([place])    a. Recovered LV fxn - prev as low as 25%, now 50-55% by echo 04/2014;  b. 2012 s/p MDT [id number] [place] VR single lead ICD.  Marland Kitchen Osteoarthritis    knees, back, hands, low back,   . Osteoporosis 01/2013   T-2.7 spine (01/2016)  . PONV (postoperative nausea and vomiting)   . sigmoid diverticulitis with perforation, abscess and fistula 09/30/2013  . Sleep apnea 2010   borderline, inconclusive- /w [place]     Tobacco Use: Social History   Tobacco Use  Smoking Status Never Smoker  Smokeless Tobacco Never Used  Tobacco Comment   Lived with smokers x 23 yrs, smoked herself "for a week"     Labs: Recent Review Flowsheet Data    Labs for ITP Cardiac and Pulmonary Rehab Latest Ref Rng & Units 11/09/2017 10/16/2018 10/16/2018 11/27/2018 03/18/2019   Cholestrol 0 - 200 mg/dL 197 - - 231(H) 217(H)   LDLCALC 0 - 99 mg/dL - - - - 131(H)   LDLDIRECT mg/dL 124.0 - - 164.0 -   HDL >39.00 mg/dL 45.30 - - 45.20 46.60   Trlycerides 0 - 149 mg/dL 223.0(H) - - 264.0(H) 199.0(H)   Hemoglobin A1c 4.6 - 6.5 % - - - 5.9 -  PHART 7.35 - 7.45 - 7.447 - - -   PCO2ART 32 - 48 mmHg - 40.1 - - -   HCO3 20.0 - 28.0 mmol/L - 27.6 26.2 - -   TCO2 22 - 32 mmol/L - 29 27 - -   O2SAT % - 95.0 63.0 - -       Exercise Target Goals: Exercise Program Goal: Individual exercise prescription set using results from initial 6 min walk test and THRR while considering  patient's activity barriers and safety.   Exercise Prescription Goal: Initial exercise prescription builds to 30-45 minutes a day of aerobic activity, 2-3 days per week.  Home exercise guidelines will be given to patient during program as part of exercise prescription that the participant will acknowledge.   Education: Aerobic Exercise & Resistance Training: - Gives group verbal and written instruction on the various components of exercise. Focuses on aerobic and resistive training programs and the benefits of this training and how to safely progress through these programs..   Education: Exercise & Equipment Safety: - Individual verbal instruction and demonstration of equipment use and safety with use of the equipment.   Cardiac Rehab from 10/01/2019 in [facility] Cardiac and Pulmonary Rehab  Date 10/01/19  Educator [facility]  Instruction Review Code 1- Verbalizes Understanding      Education: Exercise Physiology & General Exercise Guidelines: - Group verbal and written instruction with models to review the exercise physiology of the cardiovascular system and associated critical values. Provides general exercise guidelines with specific guidelines to  those with heart or lung disease.    Education: Flexibility, Balance, Mind/Body Relaxation: Provides group verbal/written instruction on the benefits of flexibility and balance training, including mind/body exercise modes such as yoga, pilates and tai chi.  Demonstration and skill practice provided.   Activity Barriers & Risk Stratification:  Activity Barriers & Cardiac Risk Stratification - 09/30/19 1417      Activity Barriers & Cardiac Risk Stratification   Activity Barriers Arthritis;Muscular Weakness;Deconditioning;Shortness of Breath;Decreased Ventricular Function;Other (comment);Neck/Spine Problems;Back Problems;Joint Problems;Balance Concerns;History of Falls    Comments neuropathy ins hands and feet, knees can be bothersome, arthritis in neck, calified disc, IBS issues, bending over causes SOB and fatigue    Cardiac Risk Stratification High           6 Minute Walk:  6 Minute Walk    Row Name 10/01/19 1434         6 Minute Walk   Phase Initial     Distance 870 feet     Walk Time 6 minutes     # of Rest Breaks 0     MPH 1.65     METS 2.12     RPE 13     Perceived Dyspnea  2     VO2 Peak 7.43     Symptoms Yes (comment)     Comments tired, SOB, shuffling gait (uses walking stick)     Resting HR 94 bpm     Resting BP 122/70     Resting Oxygen Saturation  96 %     Exercise Oxygen Saturation  during 6 min walk 98 %     Max Ex. HR 107 bpm     Max Ex. BP 156/70     2 Minute Post BP 134/70            Oxygen Initial Assessment:   Oxygen Re-Evaluation:   Oxygen Discharge (Final Oxygen Re-Evaluation):   Initial Exercise Prescription:  Initial Exercise Prescription - 10/01/19 1400  Date of Initial Exercise RX and Referring Provider   Date 10/01/19    Referring Provider [name] MD      Treadmill   MPH 1.5    Grade 0    Minutes 15    METs 2.15      Recumbant Bike   Level 1    Watts 12    Minutes 15    METs 2      Arm Ergometer   Level  1    Watts 25    RPM 25    Minutes 15    METs 2      REL-XR   Level 1    Speed 50    Minutes 15    METs 2      Prescription Details   Frequency (times per week) 3    Duration Progress to 30 minutes of continuous aerobic without signs/symptoms of physical distress      Intensity   THRR 40-80% of Max Heartrate 115-136    Ratings of Perceived Exertion 11-13    Perceived Dyspnea 0-4      Progression   Progression Continue to progress workloads to maintain intensity without signs/symptoms of physical distress.      Resistance Training   Training Prescription Yes    Weight 3 lb    Reps 10-15           Perform Capillary Blood Glucose checks as needed.  Exercise Prescription Changes:  Exercise Prescription Changes    Row Name 10/01/19 1400 10/14/19 1600           Response to Exercise   Blood Pressure (Admit) 122/70 110/68      Blood Pressure (Exercise) 156/70 128/56      Blood Pressure (Exit) 134/70 126/70      Heart Rate (Admit) 94 bpm 89 bpm      Heart Rate (Exercise) 107 bpm 109 bpm      Heart Rate (Exit) 97 bpm 86 bpm      Oxygen Saturation (Admit) 96 % --      Oxygen Saturation (Exercise) 98 % --      Rating of Perceived Exertion (Exercise) 13 15      Perceived Dyspnea (Exercise) 2 --      Symptoms tired, SOB, shuffling gait, using walking stick none      Comments walk test results third full day of exercise       Duration -- Continue with 30 min of aerobic exercise without signs/symptoms of physical distress.      Intensity -- THRR unchanged        Progression   Progression -- Continue to progress workloads to maintain intensity without signs/symptoms of physical distress.      Average METs -- 2.13        Resistance Training   Training Prescription -- Yes      Weight -- 2 lb      Reps -- 10-15        Interval Training   Interval Training -- No        Treadmill   MPH -- 1.5      Grade -- 0      Minutes -- 15      METs -- 2.15        Recumbant  Bike   Level -- 1      Watts -- 15      Minutes -- 15      METs -- 2.15  Arm Ergometer   Level -- 1      Minutes -- 15      METs -- 2.1        REL-XR   Level -- 1      Minutes -- 15             Exercise Comments:   Exercise Goals and Review:  Exercise Goals    Row Name 10/01/19 1437             Exercise Goals   Increase Physical Activity Yes       Intervention Provide advice, education, support and counseling about physical activity/exercise needs.;Develop an individualized exercise prescription for aerobic and resistive training based on initial evaluation findings, risk stratification, comorbidities and participant's personal goals.       Expected Outcomes Short Term: Attend rehab on a regular basis to increase amount of physical activity.;Long Term: Add in home exercise to make exercise part of routine and to increase amount of physical activity.;Long Term: Exercising regularly at least 3-5 days a week.       Increase Strength and Stamina Yes       Intervention Provide advice, education, support and counseling about physical activity/exercise needs.;Develop an individualized exercise prescription for aerobic and resistive training based on initial evaluation findings, risk stratification, comorbidities and participant's personal goals.       Expected Outcomes Short Term: Perform resistance training exercises routinely during rehab and add in resistance training at home;Short Term: Increase workloads from initial exercise prescription for resistance, speed, and METs.;Long Term: Improve cardiorespiratory fitness, muscular endurance and strength as measured by increased METs and functional capacity ( )       Able to understand and use rate of perceived exertion (RPE) scale Yes       Intervention Provide education and explanation on how to use RPE scale       Expected Outcomes Short Term: Able to use RPE daily in rehab to express subjective intensity level;Long Term:   Able to use RPE to guide intensity level when exercising independently       Able to understand and use Dyspnea scale Yes       Intervention Provide education and explanation on how to use Dyspnea scale       Expected Outcomes Short Term: Able to use Dyspnea scale daily in rehab to express subjective sense of shortness of breath during exertion;Long Term: Able to use Dyspnea scale to guide intensity level when exercising independently       Knowledge and understanding of Target Heart Rate Range (THRR) Yes       Intervention Provide education and explanation of THRR including how the numbers were predicted and where they are located for reference       Expected Outcomes Short Term: Able to state/look up THRR;Short Term: Able to use daily as guideline for intensity in rehab;Long Term: Able to use THRR to govern intensity when exercising independently       Able to check pulse independently Yes       Intervention Provide education and demonstration on how to check pulse in carotid and radial arteries.;Review the importance of being able to check your own pulse for safety during independent exercise       Expected Outcomes Short Term: Able to explain why pulse checking is important during independent exercise;Long Term: Able to check pulse independently and accurately       Understanding of Exercise Prescription Yes       Intervention  Provide education, explanation, and written materials on patient's individual exercise prescription       Expected Outcomes Short Term: Able to explain program exercise prescription;Long Term: Able to explain home exercise prescription to exercise independently              Exercise Goals Re-Evaluation :  Exercise Goals Re-Evaluation    [place] Name 10/03/19 1113 10/14/19 1605           Exercise Goal Re-Evaluation   Exercise Goals Review Increase Physical Activity;Able to understand and use rate of perceived exertion (RPE) scale;Knowledge and understanding of Target  Heart Rate Range (THRR);Understanding of Exercise Prescription;Increase Strength and Stamina;Able to check pulse independently Increase Physical Activity;Increase Strength and Stamina;Understanding of Exercise Prescription      Comments Reviewed RPE and dyspnea scales, THR and program prescription with pt today.  Pt voiced understanding and was given a copy of goals to take home. Charli is off to a good start in rehab.  She has completed her first three sessions thus far.  We will continue to encourage good attendance.  We will continue to monitor her progress.      Expected Outcomes Short: Use RPE daily to regulate intensity. Long: Follow program prescription in THR. Short: Continue to attend regularly Long: Continue to follow program prescription.             Discharge Exercise Prescription (Final Exercise Prescription Changes):  Exercise Prescription Changes - 10/14/19 1600      Response to Exercise   Blood Pressure (Admit) 110/68    Blood Pressure (Exercise) 128/56    Blood Pressure (Exit) 126/70    Heart Rate (Admit) 89 bpm    Heart Rate (Exercise) 109 bpm    Heart Rate (Exit) 86 bpm    Rating of Perceived Exertion (Exercise) 15    Symptoms none    Comments third full day of exercise     Duration Continue with 30 min of aerobic exercise without signs/symptoms of physical distress.    Intensity THRR unchanged      Progression   Progression Continue to progress workloads to maintain intensity without signs/symptoms of physical distress.    Average METs 2.13      Resistance Training   Training Prescription Yes    Weight 2 lb    Reps 10-15      Interval Training   Interval Training No      Treadmill   MPH 1.5    Grade 0    Minutes 15    METs 2.15      Recumbant Bike   Level 1    Watts 15    Minutes 15    METs 2.15      Arm Ergometer   Level 1    Minutes 15    METs 2.1      REL-XR   Level 1    Minutes 15           Nutrition:  Target Goals: Understanding  of nutrition guidelines, daily intake of sodium '1500mg'$ , cholesterol '200mg'$ , calories 30% from fat and 7% or less from saturated fats, daily to have 5 or more servings of fruits and vegetables.  Education: Controlling Sodium/Reading Food Labels -Group verbal and written material supporting the discussion of sodium use in heart healthy nutrition. Review and explanation with models, verbal and written materials for utilization of the food label.   Education: General Nutrition Guidelines/Fats and Fiber: -Group instruction provided by verbal, written material, models and posters to  present the general guidelines for heart healthy nutrition. Gives an explanation and review of dietary fats and fiber.   Biometrics:  Pre Biometrics - 10/01/19 1437      Pre Biometrics   Height 5' 6.3" (1.684 m)    Weight 178 lb 9.6 oz (81 kg)    BMI (Calculated) 28.57    Single Leg Stand 5.66 seconds            Nutrition Therapy Plan and Nutrition Goals:  Nutrition Therapy & Goals - 10/15/19 1134      Nutrition Therapy   Diet heart healthy, low Na    Protein (specify units) 75-80g    Fiber 25 grams    Whole Grain Foods 3 servings    Saturated Fats 12 max. grams    Fruits and Vegetables 5 servings/day    Sodium 1.5 grams      Personal Nutrition Goals   Nutrition Goal ST: add protein to breakfast or snack (discussed nuts/seeds)LT: Increase EF - now 30%    Comments Pt reports getting sick every 6 months which lowers her EF - has chronic bronchitis. Pt gets SOB very easily- affects ADLs. cooking or shopping is harder for her at this time due to SOB and fatigue. [place] - cheese biscuit (no butter and removed insides with 1 slice of cheese) with medium sweet tea. L: Fruit, ham, leftover salad S: salt free popcorn and fruit or an oatmeal raisin cookie D: two vegetables (zucchini, onion,  sweet potatoes, and carrots and green beans, yellow squash, avocado) and low salt meat (salmon or other fish), brown rice,  low fat dairy. Sometimes ice cream. IBS - onions are trigger for her, will only have it sometimes. Pt has tried to remove cheese and dairy but it didn't seem to help. Doesn't cook with salt and is mindful about processed foods and salt. Pt doesn't like peanut butter. Discussed heart healthy eating as well as pulmonary MNT.      Intervention Plan   Intervention Prescribe, educate and counsel regarding individualized specific dietary modifications aiming towards targeted core components such as weight, hypertension, lipid management, diabetes, heart failure and other comorbidities.;Nutrition handout(s) given to patient.    Expected Outcomes Short Term Goal: Understand basic principles of dietary content, such as calories, fat, sodium, cholesterol and nutrients.;Short Term Goal: A plan has been developed with personal nutrition goals set during dietitian appointment.;Long Term Goal: Adherence to prescribed nutrition plan.           Nutrition Assessments:  Nutrition Assessments - 10/01/19 1438      MEDFICTS Scores   Pre Score 48           MEDIFICTS Score Key:          ?70 Need to make dietary changes          40-70 Heart Healthy Diet         ? 40 Therapeutic Level Cholesterol Diet  Nutrition Goals Re-Evaluation:   Nutrition Goals Discharge (Final Nutrition Goals Re-Evaluation):   Psychosocial: Target Goals: Acknowledge presence or absence of significant depression and/or stress, maximize coping skills, provide positive support system. Participant is able to verbalize types and ability to use techniques and skills needed for reducing stress and depression.   Education: Depression - Provides group verbal and written instruction on the correlation between heart/lung disease and depressed mood, treatment options, and the stigmas associated with seeking treatment.   Education: Sleep Hygiene -Provides group verbal and written instruction about how sleep can affect  your health.  Define  sleep hygiene, discuss sleep cycles and impact of sleep habits. Review good sleep hygiene tips.     Education: Stress and Anxiety: - Provides group verbal and written instruction about the health risks of elevated stress and causes of high stress.  Discuss the correlation between heart/lung disease and anxiety and treatment options. Review healthy ways to manage with stress and anxiety.    Initial Review & Psychosocial Screening:  Initial Psych Review & Screening - 09/30/19 1423      Initial Review   Current issues with Current Sleep Concerns;Current Depression;Current Psychotropic Meds;Current Stress Concerns    Source of Stress Concerns Chronic Illness;Family    Comments Constantly dealing with health, does not sleep well and will nap during day,  feels well managed on current medication, Son and family live in [place] and they don't see them as much as they would like to, not on their radar      [place]? Yes   husband   Comments Son and family live in [place] and they don't see them as much as they would like to, not on their radar (grandkids 8th grade and high school)      Barriers   Psychosocial barriers to participate in program The patient should benefit from training in stress management and relaxation.;Psychosocial barriers identified (see note)      Screening Interventions   Interventions Encouraged to exercise;Provide feedback about the scores to participant;To provide support and resources with identified psychosocial needs    Expected Outcomes Short Term goal: Utilizing psychosocial counselor, staff and physician to assist with identification of specific Stressors or current issues interfering with healing process. Setting desired goal for each stressor or current issue identified.;Long Term Goal: Stressors or current issues are controlled or eliminated.;Short Term goal: Identification and review with participant of any Quality of Life or Depression  concerns found by scoring the questionnaire.;Long Term goal: The participant improves quality of Life and PHQ9 Scores as seen by post scores and/or verbalization of changes           Quality of Life Scores:   Quality of Life - 10/01/19 1437      Quality of Life   Select Quality of Life      Quality of Life Scores   Health/Function Pre 8.07 %    Socioeconomic Pre 21.57 %    Psych/Spiritual Pre 17.79 %    Family Pre 18 %    GLOBAL Pre 14.37 %          Scores of 19 and below usually indicate a poorer quality of life in these areas.  A difference of  2-3 points is a clinically meaningful difference.  A difference of 2-3 points in the total score of the Quality of Life Index has been associated with significant improvement in overall quality of life, self-image, physical symptoms, and general health in studies assessing change in quality of life.  PHQ-9: Recent Review Flowsheet Data    Depression screen [place] [facility] 2/9 10/01/2019 11/27/2018 11/09/2017   Decreased Interest 2 0 0   Down, Depressed, Hopeless '2 1 1   '$ PHQ - 2 Score '4 1 1   '$ Altered sleeping 3 - 2   Tired, decreased energy 3 - 3   Change in appetite 0 - 1   Feeling bad or failure about yourself  0 - 0   Trouble concentrating 1 - 0   Moving slowly or fidgety/restless 0 - 0  Suicidal thoughts 0 - 0   PHQ-9 Score 11 - 7   Difficult doing work/chores Very difficult - Very difficult     Interpretation of Total Score  Total Score Depression Severity:  1-4 = Minimal depression, 5-9 = Mild depression, 10-14 = Moderate depression, 15-19 = Moderately severe depression, 20-27 = Severe depression   Psychosocial Evaluation and Intervention:  Psychosocial Evaluation - 09/30/19 1431      Psychosocial Evaluation & Interventions   Interventions Stress management education;Encouraged to exercise with the program and follow exercise prescription    Comments Charli is coming into rehab for a second time to boost her EF from her heart  failure. She enjoyed it last time and is looking forward to doing it again.  She has a history of depression but is currently feeling well managed for her mood, but she is not sleeping well and wants to talk with her doctor about possibly changing her medication to one that would also help her sleep better.  She is often up at night from pain and trips to bathroom, but she does nap most afternoons.  She is frustrated with her son and his family as they do not see them as much as she would like.  Her son lives in [place], and she used to care for her grandchildren all through their early years and now they rarely see them.  She has an odd reaction to most new medications and has to be careful whenever starting anything new as she does not know how her body witll respond.  He husband is her primary support system but also their neighbors.  She enjoys gardening and helping keep up the house.  Her biggest goal is to improve her EF again and to try to decrease some of her pain, if possible.    Expected Outcomes Short: Attend rehab for exercise to strengthen heart and boost stamina.  Long: Exercise to help improve sleep.    Continue Psychosocial Services  Follow up required by staff           Psychosocial Re-Evaluation:   Psychosocial Discharge (Final Psychosocial Re-Evaluation):   Vocational Rehabilitation: Provide vocational rehab assistance to qualifying candidates.   Vocational Rehab Evaluation & Intervention:  Vocational Rehab - 09/30/19 1423      Initial Vocational Rehab Evaluation & Intervention   Assessment shows need for Vocational Rehabilitation No           Education: Education Goals: Education classes will be provided on a variety of topics geared toward better understanding of heart health and risk factor modification. Participant will state understanding/return demonstration of topics presented as noted by education test scores.  Learning Barriers/Preferences:  Learning  Barriers/Preferences - 09/30/19 1417      Learning Barriers/Preferences   Learning Barriers --   arthritis and trigger finger can cause her to go slowly   Learning Preferences Skilled Demonstration           General Cardiac Education Topics:  AED/CPR: - Group verbal and written instruction with the use of models to demonstrate the basic use of the AED with the basic ABC's of resuscitation.   Anatomy & Physiology of the Heart: - Group verbal and written instruction and models provide basic cardiac anatomy and physiology, with the coronary electrical and arterial systems. Review of Valvular disease and Heart Failure   Cardiac Procedures: - Group verbal and written instruction to review commonly prescribed medications for heart disease. Reviews the medication, class of the drug, and side effects.  Includes the steps to properly store meds and maintain the prescription regimen. (beta blockers and nitrates)   Cardiac Medications I: - Group verbal and written instruction to review commonly prescribed medications for heart disease. Reviews the medication, class of the drug, and side effects. Includes the steps to properly store meds and maintain the prescription regimen.   Cardiac Medications II: -Group verbal and written instruction to review commonly prescribed medications for heart disease. Reviews the medication, class of the drug, and side effects. (all other drug classes)    Go Sex-Intimacy & Heart Disease, Get SMART - Goal Setting: - Group verbal and written instruction through game format to discuss heart disease and the return to sexual intimacy. Provides group verbal and written material to discuss and apply goal setting through the application of the S.M.A.R.T. Method.   Other Matters of the Heart: - Provides group verbal, written materials and models to describe Stable Angina and Peripheral Artery. Includes description of the disease process and treatment options available  to the cardiac patient.   Infection Prevention: - Provides verbal and written material to individual with discussion of infection control including proper hand washing and proper equipment cleaning during exercise session.   Cardiac Rehab from 10/01/2019 in [facility] Cardiac and Pulmonary Rehab  Date 10/01/19  Educator [facility]  Instruction Review Code 1- Verbalizes Understanding      Falls Prevention: - Provides verbal and written material to individual with discussion of falls prevention and safety.   Cardiac Rehab from 10/01/2019 in [facility] Cardiac and Pulmonary Rehab  Date 10/01/19  Educator [facility]  Instruction Review Code 1- Verbalizes Understanding      Other: -Provides group and verbal instruction on various topics (see comments)   Knowledge Questionnaire Score:  Knowledge Questionnaire Score - 10/01/19 1438      Knowledge Questionnaire Score   Pre Score 20/26 Education Focus: Exercise, Nutrition, Anatomy           Core Components/Risk Factors/Patient Goals at Admission:  Personal Goals and Risk Factors at Admission - 10/01/19 1438      Core Components/Risk Factors/Patient Goals on Admission    Weight Management Yes;Weight Loss    Intervention Weight Management: Develop a combined nutrition and exercise program designed to reach desired caloric intake, while maintaining appropriate intake of nutrient and fiber, sodium and fats, and appropriate energy expenditure required for the weight goal.;Weight Management: Provide education and appropriate resources to help participant work on and attain dietary goals.    Admit Weight 178 lb 9.6 oz (81 kg)    Goal Weight: Short Term 173 lb (78.5 kg)    Goal Weight: Long Term 168 lb (76.2 kg)    Expected Outcomes Short Term: Continue to assess and modify interventions until short term weight is achieved;Long Term: Adherence to nutrition and physical activity/exercise program aimed toward attainment of established weight goal;Weight Loss:  Understanding of general recommendations for a balanced deficit meal plan, which promotes 1-2 lb weight loss per week and includes a negative energy balance of [phone] kcal/d;Understanding recommendations for meals to include 15-35% energy as protein, 25-35% energy from fat, 35-60% energy from carbohydrates, less than '200mg'$  of dietary cholesterol, 20-35 gm of total fiber daily;Understanding of distribution of calorie intake throughout the day with the consumption of 4-5 meals/snacks    Heart Failure Yes    Intervention Provide a combined exercise and nutrition program that is supplemented with education, support and counseling about heart failure. Directed toward relieving symptoms such as shortness of breath, decreased exercise  tolerance, and extremity edema.    Expected Outcomes Improve functional capacity of life;Short term: Attendance in program 2-3 days a week with increased exercise capacity. Reported lower sodium intake. Reported increased fruit and vegetable intake. Reports medication compliance.;Short term: Daily weights obtained and reported for increase. Utilizing diuretic protocols set by physician.;Long term: Adoption of self-care skills and reduction of barriers for early signs and symptoms recognition and intervention leading to self-care maintenance.    Lipids Yes    Intervention Provide education and support for participant on nutrition & aerobic/resistive exercise along with prescribed medications to achieve LDL '70mg'$ , HDL >'40mg'$ .    Expected Outcomes Short Term: Participant states understanding of desired cholesterol values and is compliant with medications prescribed. Participant is following exercise prescription and nutrition guidelines.;Long Term: Cholesterol controlled with medications as prescribed, with individualized exercise RX and with personalized nutrition plan. Value goals: LDL < '70mg'$ , HDL > 40 mg.           Education:Diabetes - Individual verbal and written instruction  to review signs/symptoms of diabetes, desired ranges of glucose level fasting, after meals and with exercise. Acknowledge that pre and post exercise glucose checks will be done for 3 sessions at entry of program.   Education: Know Your Numbers and Risk Factors: -Group verbal and written instruction about important numbers in your health.  Discussion of what are risk factors and how they play a role in the disease process.  Review of Cholesterol, Blood Pressure, Diabetes, and BMI and the role they play in your overall health.   Core Components/Risk Factors/Patient Goals Review:    Core Components/Risk Factors/Patient Goals at Discharge (Final Review):    ITP Comments:  ITP Comments    Row Name 09/30/19 1437 10/01/19 1434 10/03/19 1113 10/29/19 0638     ITP Comments Completed virtual orientation today.  EP evaluation is scheduled for Thursday 5/19 at 1130.  Documentation for diagnosis can be found in [facility] encounter 09/22/19. Completed 6MWT and gym orientation.  Initial ITP created and sent for review to Dr. Emily Filbert, Medical Director. First full day of exercise!  Patient was oriented to gym and equipment including functions, settings, policies, and procedures.  Patient's individual exercise prescription and treatment plan were reviewed.  All starting workloads were established based on the results of the 6 minute walk test done at initial orientation visit.  The plan for exercise progression was also introduced and progression will be customized based on patient's performance and goals 30 Day review completed. Medical Director ITP review done, changes made as directed, and signed approval by Medical Director.           Comments: 30 Day review completed. Medical Director ITP review done, changes made as directed, and signed approval by Medical Director.

## 2019-10-29 NOTE — Telephone Encounter (Signed)
Pt is aware of recommendations and voiced her understanding.  Nothing further is needed.  

## 2019-11-03 ENCOUNTER — Other Ambulatory Visit: Payer: Self-pay

## 2019-11-03 ENCOUNTER — Other Ambulatory Visit
Admission: RE | Admit: 2019-11-03 | Discharge: 2019-11-03 | Disposition: A | Discharge status: Home / Self Care | Payer: Medicare Other | Source: Ambulatory Visit | Attending: Pulmonary Disease | Admitting: Pulmonary Disease

## 2019-11-03 ENCOUNTER — Encounter: Payer: Medicare Other | Admitting: *Deleted

## 2019-11-03 DIAGNOSIS — Z01812 Encounter for preprocedural laboratory examination: Secondary | ICD-10-CM | POA: Diagnosis present

## 2019-11-03 DIAGNOSIS — Z20822 Contact with and (suspected) exposure to covid-19: Secondary | ICD-10-CM | POA: Diagnosis not present

## 2019-11-03 DIAGNOSIS — I5022 Chronic systolic (congestive) heart failure: Secondary | ICD-10-CM

## 2019-11-03 NOTE — Progress Notes (Signed)
Daily Session Note  Patient Details  Name: Ashley [name] MRN: [id number] Date of Birth: [birth year] Referring Provider:     Cardiac Rehab from 10/01/2019 in [facility] Cardiac and Pulmonary Rehab  Referring Provider [name] MD      Encounter Date: 11/03/2019  Check In:  Session Check In - 11/03/19 1114      Check-In   Supervising physician immediately available to respond to emergencies See telemetry face sheet for immediately available ER MD    Location [facility]-Cardiac & Pulmonary Rehab    Staff Present Renita Papa, RN Moises Blood, BS, ACSM CEP, Exercise Physiologist;Amanda Oletta Darter, [place], ACSM CEP, Exercise Physiologist    Virtual Visit No    Medication changes reported     No    Fall or balance concerns reported    No    Warm-up and Cool-down Performed on first and last piece of equipment    Resistance Training Performed Yes    VAD Patient? No    PAD/SET Patient? No      Pain Assessment   Currently in Pain? No/denies              Social History   Tobacco Use  Smoking Status Never Smoker  Smokeless Tobacco Never Used  Tobacco Comment   Lived with smokers x 23 yrs, smoked herself "for a week"    Goals Met:  Independence with exercise equipment Exercise tolerated well No report of cardiac concerns or symptoms Strength training completed today  Goals Unmet:  Not Applicable  Comments: Pt able to follow exercise prescription today without complaint.  Will continue to monitor for progression.    Dr. Emily Filbert is Medical Director for [place] and [facility].

## 2019-11-04 ENCOUNTER — Ambulatory Visit: Payer: Medicare Other | Attending: Pulmonary Disease

## 2019-11-04 ENCOUNTER — Other Ambulatory Visit: Payer: Self-pay

## 2019-11-04 DIAGNOSIS — J4520 Mild intermittent asthma, uncomplicated: Secondary | ICD-10-CM | POA: Insufficient documentation

## 2019-11-04 DIAGNOSIS — R0602 Shortness of breath: Secondary | ICD-10-CM

## 2019-11-04 LAB — SARS CORONAVIRUS 2 (TAT 6-24 HRS): SARS Coronavirus 2: NEGATIVE

## 2019-11-05 ENCOUNTER — Encounter: Payer: Medicare Other | Admitting: *Deleted

## 2019-11-05 DIAGNOSIS — I5022 Chronic systolic (congestive) heart failure: Secondary | ICD-10-CM

## 2019-11-05 NOTE — Progress Notes (Signed)
Daily Session Note  Patient Details  Name: Ashley [name] MRN: [id number] Date of Birth: Oct 07, 1945 Referring Provider:     Cardiac Rehab from 10/01/2019 in [facility] Cardiac and Pulmonary Rehab  Referring Provider [name] MD      Encounter Date: 11/05/2019  Check In:  Session Check In - 11/05/19 1118      Check-In   Supervising physician immediately available to respond to emergencies See telemetry face sheet for immediately available ER MD    Location [facility]-Cardiac & Pulmonary Rehab    Staff Present Renita Papa, RN BSN;Melissa Caiola RDN, Rowe Pavy, BA, ACSM CEP, Exercise Physiologist    Virtual Visit No    Medication changes reported     No    Fall or balance concerns reported    No    Warm-up and Cool-down Performed on first and last piece of equipment    Resistance Training Performed Yes    VAD Patient? No    PAD/SET Patient? No      Pain Assessment   Currently in Pain? No/denies              Social History   Tobacco Use  Smoking Status Never Smoker  Smokeless Tobacco Never Used  Tobacco Comment   Lived with smokers x 23 yrs, smoked herself "for a week"    Goals Met:  Independence with exercise equipment Exercise tolerated well No report of cardiac concerns or symptoms Strength training completed today  Goals Unmet:  Not Applicable  Comments: Pt able to follow exercise prescription today without complaint.  Will continue to monitor for progression.    Dr. Emily Filbert is Medical Director for [place] and [facility].

## 2019-11-07 ENCOUNTER — Other Ambulatory Visit: Payer: Self-pay

## 2019-11-07 ENCOUNTER — Encounter: Payer: Medicare Other | Admitting: *Deleted

## 2019-11-07 DIAGNOSIS — I5022 Chronic systolic (congestive) heart failure: Secondary | ICD-10-CM

## 2019-11-07 NOTE — Progress Notes (Signed)
Daily Session Note  Patient Details  Name: CHERILYNN SCHOMBURG MRN: [id number] Date of Birth: [birth year] Referring Provider:     Cardiac Rehab from 10/01/2019 in [facility] Cardiac and Pulmonary Rehab  Referring Provider [name] MD      Encounter Date: 11/07/2019  Check In:  Session Check In - 11/07/19 1131      Check-In   Staff Present Nyoka Cowden, RN, BSN, MA;Meredith Sherryll Burger, RN BSN;Joseph Hood RCP,RRT,BSRT    Virtual Visit No    Medication changes reported     No    Fall or balance concerns reported    No    Tobacco Cessation No Change    Warm-up and Cool-down Performed on first and last piece of equipment    Resistance Training Performed Yes    VAD Patient? No    PAD/SET Patient? No      Pain Assessment   Currently in Pain? No/denies              Social History   Tobacco Use  Smoking Status Never Smoker  Smokeless Tobacco Never Used  Tobacco Comment   Lived with smokers x 23 yrs, smoked herself "for a week"    Goals Met:  Independence with exercise equipment Exercise tolerated well No report of cardiac concerns or symptoms Strength training completed today  Goals Unmet:  Not Applicable  Comments: Pt able to follow exercise prescription today without complaint.  Will continue to monitor for progression.   Dr. Emily Filbert is Medical Director for [place] and [facility].

## 2019-11-10 ENCOUNTER — Other Ambulatory Visit: Payer: Self-pay

## 2019-11-10 ENCOUNTER — Encounter: Payer: Medicare Other | Admitting: *Deleted

## 2019-11-10 ENCOUNTER — Ambulatory Visit (INDEPENDENT_AMBULATORY_CARE_PROVIDER_SITE_OTHER): Payer: Medicare Other | Admitting: *Deleted

## 2019-11-10 DIAGNOSIS — I420 Dilated cardiomyopathy: Secondary | ICD-10-CM | POA: Diagnosis not present

## 2019-11-11 LAB — CUP PACEART REMOTE DEVICE CHECK
Battery Remaining Longevity: 83 mo
Battery Voltage: 2.99 V
Brady Statistic AP VP Percent: 0.06 %
Brady Statistic AP VS Percent: 0.01 %
Brady Statistic AS VP Percent: 99.58 %
Brady Statistic AS VS Percent: 0.35 %
Brady Statistic RA Percent Paced: 0.07 %
Brady Statistic RV Percent Paced: 99.29 %
Date Time Interrogation Session: 20210628073724
HighPow Impedance: 73 Ohm
Implantable Lead Implant Date: 20130301
Implantable Lead Implant Date: 20200622
Implantable Lead Implant Date: 20200622
Implantable Lead Location: 753859
Implantable Lead Location: 753860
Implantable Lead Location: 753860
Implantable Lead Model: 3830
Implantable Lead Model: 5076
Implantable Lead Model: 6935
Implantable Pulse Generator Implant Date: 20200622
Lead Channel Impedance Value: 266 Ohm
Lead Channel Impedance Value: 323 Ohm
Lead Channel Impedance Value: 342 Ohm
Lead Channel Impedance Value: 437 Ohm
Lead Channel Impedance Value: 456 Ohm
Lead Channel Impedance Value: 513 Ohm
Lead Channel Pacing Threshold Amplitude: 0.375 V
Lead Channel Pacing Threshold Amplitude: 0.5 V
Lead Channel Pacing Threshold Amplitude: 0.625 V
Lead Channel Pacing Threshold Pulse Width: 0.4 ms
Lead Channel Pacing Threshold Pulse Width: 0.4 ms
Lead Channel Pacing Threshold Pulse Width: 0.4 ms
Lead Channel Sensing Intrinsic Amplitude: 1.875 mV
Lead Channel Sensing Intrinsic Amplitude: 1.875 mV
Lead Channel Sensing Intrinsic Amplitude: 10.75 mV
Lead Channel Sensing Intrinsic Amplitude: 10.75 mV
Lead Channel Setting Pacing Amplitude: 1 V
Lead Channel Setting Pacing Amplitude: 1.5 V
Lead Channel Setting Pacing Amplitude: 2.5 V
Lead Channel Setting Pacing Pulse Width: 0.4 ms
Lead Channel Setting Pacing Pulse Width: 0.4 ms
Lead Channel Setting Sensing Sensitivity: 0.3 mV

## 2019-11-12 ENCOUNTER — Other Ambulatory Visit: Payer: Self-pay

## 2019-11-12 DIAGNOSIS — I5022 Chronic systolic (congestive) heart failure: Secondary | ICD-10-CM | POA: Diagnosis not present

## 2019-11-12 NOTE — Progress Notes (Signed)
Remote ICD transmission.   

## 2019-11-12 NOTE — Progress Notes (Signed)
Daily Session Note  Patient Details  Name: Ashley [name] MRN: [id number] Date of Birth: 1945/06/08 Referring Provider:     Cardiac Rehab from 10/01/2019 in [facility] Cardiac and Pulmonary Rehab  Referring Provider [name] MD      Encounter Date: 11/12/2019  Check In:  Session Check In - 11/12/19 1151      Check-In   Supervising physician immediately available to respond to emergencies See telemetry face sheet for immediately available ER MD    Location [facility]-Cardiac & Pulmonary Rehab    Staff Present Justin Mend RCP,RRT,BSRT;Vida Rigger RN, BSN;Jessica Luan Pulling, MA, RCEP, CCRP, Marylynn Pearson, MS Exercise Physiologist;Amanda Oletta Darter, [place], ACSM CEP, Exercise Physiologist;Laureen Owens Shark, BS, RRT, CPFT    Virtual Visit No    Medication changes reported     No    Fall or balance concerns reported    No    Warm-up and Cool-down Performed on first and last piece of equipment    Resistance Training Performed Yes    VAD Patient? No    PAD/SET Patient? No      Pain Assessment   Currently in Pain? No/denies              Social History   Tobacco Use  Smoking Status Never Smoker  Smokeless Tobacco Never Used  Tobacco Comment   Lived with smokers x 23 yrs, smoked herself "for a week"    Goals Met:  Independence with exercise equipment Exercise tolerated well No report of cardiac concerns or symptoms Strength training completed today  Goals Unmet:  Not Applicable  Comments: Pt able to follow exercise prescription today without complaint.  Will continue to monitor for progression.   Dr. Emily Filbert is Medical Director for [place] and [facility].

## 2019-11-14 ENCOUNTER — Other Ambulatory Visit: Payer: Self-pay

## 2019-11-14 ENCOUNTER — Encounter: Payer: Medicare Other | Attending: Cardiovascular Disease | Admitting: *Deleted

## 2019-11-14 DIAGNOSIS — I5022 Chronic systolic (congestive) heart failure: Secondary | ICD-10-CM | POA: Diagnosis not present

## 2019-11-14 NOTE — Progress Notes (Signed)
Daily Session Note  Patient Details  Name: Ashley [name] MRN: [id number] Date of Birth: 1945/08/30 Referring Provider:     Cardiac Rehab from 10/01/2019 in [facility] Cardiac and Pulmonary Rehab  Referring Provider [name] MD      Encounter Date: 11/14/2019  Check In:  Session Check In - 11/14/19 1129      Check-In   Supervising physician immediately available to respond to emergencies See telemetry face sheet for immediately available ER MD    Location [facility]-Cardiac & Pulmonary Rehab    Staff Present Nyoka Cowden, RN, BSN, MA;Meredith Sherryll Burger, RN BSN;Jessica Hawkins, MA, RCEP, CCRP, CCET    Virtual Visit No    Medication changes reported     No    Fall or balance concerns reported    No    Tobacco Cessation No Change    Warm-up and Cool-down Performed on first and last piece of equipment    Resistance Training Performed Yes    VAD Patient? No    PAD/SET Patient? No      Pain Assessment   Currently in Pain? No/denies              Social History   Tobacco Use  Smoking Status Never Smoker  Smokeless Tobacco Never Used  Tobacco Comment   Lived with smokers x 23 yrs, smoked herself "for a week"    Goals Met:  Independence with exercise equipment Exercise tolerated well No report of cardiac concerns or symptoms Strength training completed today  Goals Unmet:  Not Applicable  Comments: Pt able to follow exercise prescription today without complaint.  Will continue to monitor for progression.   Dr. Emily Filbert is Medical Director for [place] and [facility].

## 2019-11-19 ENCOUNTER — Encounter: Payer: Medicare Other | Admitting: *Deleted

## 2019-11-19 ENCOUNTER — Other Ambulatory Visit: Payer: Self-pay

## 2019-11-19 DIAGNOSIS — I5022 Chronic systolic (congestive) heart failure: Secondary | ICD-10-CM

## 2019-11-19 NOTE — Progress Notes (Signed)
Daily Session Note  Patient Details  Name: Ashley [name] MRN: [id number] Date of Birth: [birth year] Referring Provider:     Cardiac Rehab from 10/01/2019 in [facility] Cardiac and Pulmonary Rehab  Referring Provider [name] MD      Encounter Date: 11/19/2019  Check In:  Session Check In - 11/19/19 1128      Check-In   Supervising physician immediately available to respond to emergencies See telemetry face sheet for immediately available ER MD    Location [facility]-Cardiac & Pulmonary Rehab    Staff Present Renita Papa, RN BSN;Joseph [address] [place], [place], [place], CCRP, [place] [facility] [place], [place], ACSM CEP, Exercise Physiologist    Virtual Visit No    Medication changes reported     No    Fall or balance concerns reported    No    Warm-up and Cool-down Performed on first and last piece of equipment    Resistance Training Performed Yes    VAD Patient? No    PAD/SET Patient? No      Pain Assessment   Currently in Pain? No/denies              Social History   Tobacco Use  Smoking Status Never Smoker  Smokeless Tobacco Never Used  Tobacco Comment   Lived with smokers x 23 yrs, smoked herself "for a week"    Goals Met:  Independence with exercise equipment Exercise tolerated well No report of cardiac concerns or symptoms Strength training completed today  Goals Unmet:  Not Applicable  Comments: Pt able to follow exercise prescription today without complaint.  Will continue to monitor for progression.    Dr. Emily Filbert is Medical Director for [place] and [facility].

## 2019-11-21 ENCOUNTER — Other Ambulatory Visit: Payer: Self-pay

## 2019-11-21 ENCOUNTER — Encounter: Payer: Medicare Other | Admitting: *Deleted

## 2019-11-21 DIAGNOSIS — I5022 Chronic systolic (congestive) heart failure: Secondary | ICD-10-CM | POA: Diagnosis not present

## 2019-11-21 NOTE — Progress Notes (Signed)
Daily Session Note  Patient Details  Name: Ashley [name] MRN: [id number] Date of Birth: [birth year] Referring Provider:     Cardiac Rehab from 10/01/2019 in [facility] Cardiac and Pulmonary Rehab  Referring Provider Ashley Rogue MD      Encounter Date: 11/21/2019  Check In:  Session Check In - 11/21/19 1117      Check-In   Supervising physician immediately available to respond to emergencies See telemetry face sheet for immediately available ER MD    Location [facility]-Cardiac & Pulmonary Rehab    Staff Present Ashley Cowden, RN, BSN, MA;Ashley [name] [address] [place], [place], RCEP, CCRP, CCET    Virtual Visit No    Medication changes reported     No    Fall or balance concerns reported    No    Tobacco Cessation No Change    Warm-up and Cool-down Performed on first and last piece of equipment    Resistance Training Performed No    VAD Patient? No    PAD/SET Patient? No      Pain Assessment   Currently in Pain? No/denies              Social History   Tobacco Use  Smoking Status Never Smoker  Smokeless Tobacco Never Used  Tobacco Comment   Lived with smokers x 23 yrs, smoked herself "for a week"    Goals Met:  Independence with exercise equipment Exercise tolerated well No report of cardiac concerns or symptoms Strength training completed today  Goals Unmet:  Not Applicable  Comments: Pt able to follow exercise prescription today without complaint.  Will continue to monitor for progression.   Dr. Emily [name] is Medical Director for [place] and [facility].

## 2019-11-24 ENCOUNTER — Encounter: Payer: Medicare Other | Admitting: *Deleted

## 2019-11-24 ENCOUNTER — Other Ambulatory Visit: Payer: Self-pay

## 2019-11-24 DIAGNOSIS — I5022 Chronic systolic (congestive) heart failure: Secondary | ICD-10-CM

## 2019-11-24 NOTE — Progress Notes (Signed)
Daily Session Note  Patient Details  Name: DAYAMI TAITT MRN: [id number] Date of Birth: 10-11-1945 Referring Provider:     Cardiac Rehab from 10/01/2019 in [facility] Cardiac and Pulmonary Rehab  Referring Provider [name] MD      Encounter Date: 11/24/2019  Check In:  Session Check In - 11/24/19 1116      Check-In   Supervising physician immediately available to respond to emergencies See telemetry face sheet for immediately available ER MD    Location [facility]-Cardiac & Pulmonary Rehab    Staff Present Renita Papa, RN BSN;Joseph Lou [place], [place] Exercise Physiologist;Kelly Amedeo Plenty, [place], ACSM CEP, Exercise Physiologist    Virtual Visit No    Medication changes reported     No    Fall or balance concerns reported    No    Warm-up and Cool-down Performed on first and last piece of equipment    Resistance Training Performed Yes    VAD Patient? No    PAD/SET Patient? No      Pain Assessment   Currently in Pain? No/denies              Social History   Tobacco Use  Smoking Status Never Smoker  Smokeless Tobacco Never Used  Tobacco Comment   Lived with smokers x 23 yrs, smoked herself "for a week"    Goals Met:  Independence with exercise equipment Exercise tolerated well No report of cardiac concerns or symptoms Strength training completed today  Goals Unmet:  Not Applicable  Comments: Pt able to follow exercise prescription today without complaint.  Will continue to monitor for progression.    Dr. Emily Filbert is Medical Director for [place] and [facility].

## 2019-11-26 ENCOUNTER — Encounter: Payer: Medicare Other | Admitting: *Deleted

## 2019-11-26 ENCOUNTER — Encounter: Payer: Self-pay | Admitting: *Deleted

## 2019-11-26 ENCOUNTER — Other Ambulatory Visit: Payer: Self-pay

## 2019-11-26 DIAGNOSIS — I5022 Chronic systolic (congestive) heart failure: Secondary | ICD-10-CM | POA: Diagnosis not present

## 2019-11-26 NOTE — Progress Notes (Signed)
Daily Session Note  Patient Details  Name: Ashley [name] MRN: [id number] Date of Birth: 1946-03-16 Referring Provider:     Cardiac Rehab from 10/01/2019 in [facility]  Referring Provider [name] MD      Encounter Date: 11/26/2019  Check In:  Session Check In - 11/26/19 1127      Check-In   Supervising physician immediately available to respond to emergencies See telemetry face sheet for immediately available ER MD    Location [facility]    Staff Present Renita Papa, RN BSN;Joseph Hood RCP,RRT,BSRT;Amanda Oletta Darter, [place], ACSM CEP, Exercise Physiologist    Virtual Visit No    Medication changes reported     No    Fall or balance concerns reported    No    Warm-up and Cool-down Performed on first and last piece of equipment    Resistance Training Performed Yes    VAD Patient? No    PAD/SET Patient? No      Pain Assessment   Currently in Pain? No/denies              Social History   Tobacco Use  Smoking Status Never Smoker  Smokeless Tobacco Never Used  Tobacco Comment   Lived with smokers x 23 yrs, smoked herself "for a week"    Goals Met:  Independence with exercise equipment Exercise tolerated well No report of cardiac concerns or symptoms Strength training completed today  Goals Unmet:  Not Applicable  Comments: Pt able to follow exercise prescription today without complaint.  Will continue to monitor for progression.    Dr. Emily Filbert is Medical Director for [place] [facility].

## 2019-11-26 NOTE — Progress Notes (Signed)
Cardiac Individual Treatment Plan  Patient Details  Name: Ashley [name] MRN: [id number] Date of Birth: April 15, 1946 Referring Provider:     Cardiac Rehab from 10/01/2019 in [facility] Cardiac and Pulmonary Rehab  Referring Provider [name] MD      Initial Encounter Date:    Cardiac Rehab from 10/01/2019 in [facility] Cardiac and Pulmonary Rehab  Date 10/01/19      Visit Diagnosis: Heart failure, chronic systolic ([place])  Patient's Home Medications on Admission:  Current Outpatient Medications:  .  acetaminophen (TYLENOL) 500 MG tablet, Take 500 mg by mouth daily as needed for moderate pain or headache (PAIN). , Disp: , Rfl:  .  AMBULATORY NON FORMULARY MEDICATION, Medication Name:  1 squatty potty to use as needed, Disp: 1 each, Rfl: 0 .  Ascorbic Acid (VITAMIN C) 1000 MG tablet, Take 1,000 mg by mouth See admin instructions. Take 1000 mg daily, may take an additional 1000 mg as needed for cold symptoms, Disp: , Rfl:  .  aspirin EC 81 MG tablet, Take 81 mg by mouth at bedtime. , Disp: , Rfl:  .  benzonatate (TESSALON) 200 MG capsule, Take 1 capsule (200 mg total) by mouth 3 (three) times daily as needed for cough., Disp: 30 capsule, Rfl: 3 .  carvedilol (COREG) 3.125 MG tablet, Take 1 tablet (3.125 mg total) by mouth 2 (two) times daily with a meal. (Patient taking differently: Take 3.125 mg by mouth 2 (two) times daily with a meal. 1/2 tablet BID), Disp: 180 tablet, Rfl: 3 .  carvedilol (COREG) 6.25 MG tablet, TAKE 1 TABLET (6.25 MG TOTAL) BY MOUTH 2 (TWO) TIMES DAILY WITH A MEAL., Disp: 180 tablet, Rfl: 1 .  cholecalciferol (VITAMIN D) 1000 units tablet, Take 1,000 Units by mouth daily. , Disp: , Rfl:  .  Coenzyme Q10 (COQ-10) 100 MG CAPS, Take 100 mg by mouth daily., Disp: , Rfl:  .  dextromethorphan (DELSYM) 30 MG/5ML liquid, Take 15 mg by mouth daily as needed for cough. , Disp: , Rfl:  .  diclofenac Sodium (VOLTAREN) 1 % GEL, Apply 1 application topically 4 (four) times daily., Disp:  , Rfl:  .  ezetimibe (ZETIA) 10 MG tablet, Take 1 tablet (10 mg total) by mouth daily., Disp: 90 tablet, Rfl: 3 .  Fluticasone Furoate (ARNUITY ELLIPTA) 100 MCG/ACT AEPB, Inhale 1 puff into the lungs daily., Disp: 30 each, Rfl: 11 .  GARLIC PO, Take 1 capsule by mouth daily., Disp: , Rfl:  .  guaiFENesin (MUCINEX) 600 MG 12 hr tablet, Take 600 mg by mouth daily., Disp: , Rfl:  .  levalbuterol (XOPENEX HFA) 45 MCG/ACT inhaler, Inhale 2 puffs into the lungs every 4 (four) hours as needed for wheezing or shortness of breath. (Patient taking differently: Inhale 2 puffs into the lungs every 4 (four) hours as needed for wheezing or shortness of breath. ), Disp: 1 Inhaler, Rfl: 3 .  LORazepam (ATIVAN) 0.5 MG tablet, Take 0.5-1 tablets (0.25-0.5 mg total) by mouth at bedtime as needed for anxiety or sleep., Disp: 30 tablet, Rfl: 1 .  Menthol, Topical Analgesic, (BIOFREEZE ROLL-ON EX), Apply topically., Disp: , Rfl:  .  miconazole (MICOTIN) 2 % powder, Apply 1 application topically as needed (yeast)., Disp: , Rfl:  .  nystatin (MYCOSTATIN) 100000 UNIT/ML suspension, Take 5 mLs (500,000 Units total) by mouth 3 (three) times daily., Disp: 120 mL, Rfl: 0 .  nystatin cream (MYCOSTATIN), Apply 1 application topically as needed for dry skin (yeast infection)., Disp: 30 g,  Rfl: 3 .  potassium chloride (KLOR-CON) 10 MEQ tablet, Take 1 tablet (10 mEq total) by mouth daily as needed (with laxis)., Disp: 90 tablet, Rfl: 3 .  pyridOXINE (VITAMIN B-6) 100 MG tablet, Take 100 mg by mouth daily., Disp: , Rfl:  .  Red Yeast Rice Extract (RED YEAST RICE PO), Take 1 capsule by mouth in the morning and at bedtime. , Disp: , Rfl:  .  sacubitril-valsartan (ENTRESTO) 24-26 MG, Take 1 tablet by mouth 2 (two) times daily., Disp: 180 tablet, Rfl: 3 .  spironolactone (ALDACTONE) 25 MG tablet, Take 0.5 tablets (12.5 mg total) by mouth daily for 30 days. (Patient taking differently: Take 25 mg by mouth daily. 1 tablet every other day),  Disp: 15 tablet, Rfl: 0 .  torsemide (DEMADEX) 10 MG tablet, Take 1 tablet (10 mg total) by mouth every other day., Disp: 45 tablet, Rfl: 3 .  triamcinolone (NASACORT) 55 MCG/ACT AERO nasal inhaler, Place 2 sprays into the nose daily., Disp: , Rfl:  .  vitamin E 400 UNIT capsule, Take 400 Units by mouth daily., Disp: , Rfl:   Past Medical History: Past Medical History:  Diagnosis Date  . Abdominal aortic atherosclerosis ([place]) 11/2015   by CT  . Allergy   . Anemia   . Anxiety   . Automatic implantable cardioverter-defibrillator in situ 2012   a. MDT single lead ICD.  Marland Kitchen Bronchiectasis    a. possibly mild, treat URIs aggressively  . Cancer (HCC)    basal cell, arms & neck  . CHF (congestive heart failure) ([place])   . Chronic sinusitis   . COPD (chronic obstructive pulmonary disease) ([place])   . Depression   . Dyspnea   . Essential hypertension   . Fibrocystic breast disease   . Fibromyalgia   . GERD (gastroesophageal reflux disease)   . H/O multiple allergies 10/10/2013  . Hemorrhoids   . History of diverticulitis of colon   . History of pneumonia 2012  . Insomnia   . Irritable bowel syndrome   . Laryngeal nodule    Dr. Thomasena Edis  . Migraine without aura, without mention of intractable migraine without mention of status migrainosus   . Mitral regurgitation   . Nonischemic cardiomyopathy ([place])    a. Recovered LV fxn - prev as low as 25%, now 50-55% by echo 04/2014;  b. 2012 s/p MDT [id number] [place] VR single lead ICD.  Marland Kitchen Osteoarthritis    knees, back, hands, low back,   . Osteoporosis 01/2013   T-2.7 spine (01/2016)  . PONV (postoperative nausea and vomiting)   . sigmoid diverticulitis with perforation, abscess and fistula 09/30/2013  . Sleep apnea 2010   borderline, inconclusive- /w [place]     Tobacco Use: Social History   Tobacco Use  Smoking Status Never Smoker  Smokeless Tobacco Never Used  Tobacco Comment   Lived with smokers x 23 yrs, smoked herself "for a week"     Labs: Recent Review Flowsheet Data    Labs for ITP Cardiac and Pulmonary Rehab Latest Ref Rng & Units 11/09/2017 10/16/2018 10/16/2018 11/27/2018 03/18/2019   Cholestrol 0 - 200 mg/dL 197 - - 231(H) 217(H)   LDLCALC 0 - 99 mg/dL - - - - 131(H)   LDLDIRECT mg/dL 124.0 - - 164.0 -   HDL >39.00 mg/dL 45.30 - - 45.20 46.60   Trlycerides 0 - 149 mg/dL 223.0(H) - - 264.0(H) 199.0(H)   Hemoglobin A1c 4.6 - 6.5 % - - - 5.9 -  PHART 7.35 - 7.45 - 7.447 - - -   PCO2ART 32 - 48 mmHg - 40.1 - - -   HCO3 20.0 - 28.0 mmol/L - 27.6 26.2 - -   TCO2 22 - 32 mmol/L - 29 27 - -   O2SAT % - 95.0 63.0 - -       Exercise Target Goals: Exercise Program Goal: Individual exercise prescription set using results from initial 6 min walk test and THRR while considering  patient's activity barriers and safety.   Exercise Prescription Goal: Initial exercise prescription builds to 30-45 minutes a day of aerobic activity, 2-3 days per week.  Home exercise guidelines will be given to patient during program as part of exercise prescription that the participant will acknowledge.   Education: Aerobic Exercise & Resistance Training: - Gives group verbal and written instruction on the various components of exercise. Focuses on aerobic and resistive training programs and the benefits of this training and how to safely progress through these programs..   Education: Exercise & Equipment Safety: - Individual verbal instruction and demonstration of equipment use and safety with use of the equipment.   Cardiac Rehab from 10/01/2019 in [facility] Cardiac and Pulmonary Rehab  Date 10/01/19  Educator [facility]  Instruction Review Code 1- Verbalizes Understanding      Education: Exercise Physiology & General Exercise Guidelines: - Group verbal and written instruction with models to review the exercise physiology of the cardiovascular system and associated critical values. Provides general exercise guidelines with specific guidelines to those  with heart or lung disease.    Education: Flexibility, Balance, Mind/Body Relaxation: Provides group verbal/written instruction on the benefits of flexibility and balance training, including mind/body exercise modes such as yoga, pilates and tai chi.  Demonstration and skill practice provided.   Activity Barriers & Risk Stratification:  Activity Barriers & Cardiac Risk Stratification - 09/30/19 1417      Activity Barriers & Cardiac Risk Stratification   Activity Barriers Arthritis;Muscular Weakness;Deconditioning;Shortness of Breath;Decreased Ventricular Function;Other (comment);Neck/Spine Problems;Back Problems;Joint Problems;Balance Concerns;History of Falls    Comments neuropathy ins hands and feet, knees can be bothersome, arthritis in neck, calified disc, IBS issues, bending over causes SOB and fatigue    Cardiac Risk Stratification High           6 Minute Walk:  6 Minute Walk    Row Name 10/01/19 1434         6 Minute Walk   Phase Initial     Distance 870 feet     Walk Time 6 minutes     # of Rest Breaks 0     MPH 1.65     METS 2.12     RPE 13     Perceived Dyspnea  2     VO2 Peak 7.43     Symptoms Yes (comment)     Comments tired, SOB, shuffling gait (uses walking stick)     Resting HR 94 bpm     Resting BP 122/70     Resting Oxygen Saturation  96 %     Exercise Oxygen Saturation  during 6 min walk 98 %     Max Ex. HR 107 bpm     Max Ex. BP 156/70     2 Minute Post BP 134/70            Oxygen Initial Assessment:   Oxygen Re-Evaluation:   Oxygen Discharge (Final Oxygen Re-Evaluation):   Initial Exercise Prescription:  Initial Exercise Prescription - 10/01/19 1400  Date of Initial Exercise RX and Referring Provider   Date 10/01/19    Referring Provider [name] MD      Treadmill   MPH 1.5    Grade 0    Minutes 15    METs 2.15      Recumbant Bike   Level 1    Watts 12    Minutes 15    METs 2      Arm Ergometer   Level 1     Watts 25    RPM 25    Minutes 15    METs 2      REL-XR   Level 1    Speed 50    Minutes 15    METs 2      Prescription Details   Frequency (times per week) 3    Duration Progress to 30 minutes of continuous aerobic without signs/symptoms of physical distress      Intensity   THRR 40-80% of Max Heartrate 115-136    Ratings of Perceived Exertion 11-13    Perceived Dyspnea 0-4      Progression   Progression Continue to progress workloads to maintain intensity without signs/symptoms of physical distress.      Resistance Training   Training Prescription Yes    Weight 3 lb    Reps 10-15           Perform Capillary Blood Glucose checks as needed.  Exercise Prescription Changes:  Exercise Prescription Changes    Row Name 10/01/19 1400 10/14/19 1600 11/11/19 1500 11/25/19 1400       Response to Exercise   Blood Pressure (Admit) 122/70 110/68 104/60 120/66    Blood Pressure (Exercise) 156/70 128/56 118/66 122/80    Blood Pressure (Exit) 134/70 126/70 112/62 108/64    Heart Rate (Admit) 94 bpm 89 bpm 106 bpm 71 bpm    Heart Rate (Exercise) 107 bpm 109 bpm 103 bpm 91 bpm    Heart Rate (Exit) 97 bpm 86 bpm 89 bpm 80 bpm    Oxygen Saturation (Admit) 96 % - - -    Oxygen Saturation (Exercise) 98 % - - -    Rating of Perceived Exertion (Exercise) _0 Perceived Dyspnea (Exercise) 2 - - -    Symptoms tired, SOB, shuffling gait, using walking stick none none none    Comments walk test results third full day of exercise  - -    Duration - Continue with 30 min of aerobic exercise without signs/symptoms of physical distress. Continue with 30 min of aerobic exercise without signs/symptoms of physical distress. Continue with 30 min of aerobic exercise without signs/symptoms of physical distress.    Intensity - THRR unchanged THRR unchanged THRR unchanged      Progression   Progression - Continue to progress workloads to maintain intensity without signs/symptoms of physical  distress. Continue to progress workloads to maintain intensity without signs/symptoms of physical distress. Continue to progress workloads to maintain intensity without signs/symptoms of physical distress.    Average METs - 2.13 3.2 2.75      Resistance Training   Training Prescription - Yes Yes Yes    Weight - 2 lb 2 lb 2 lb    Reps - 10-15 10-15 10-15      Interval Training   Interval Training - No No No      Treadmill   MPH - 1.5 - -    Grade - 0 - -  Minutes - 15 - -    METs - 2.15 - -      Recumbant Bike   Level - 1 - -    Watts - 15 - -    Minutes - 15 - -    METs - 2.15 - -      Arm Ergometer   Level - 1 - -    Minutes - 15 - -    METs - 2.1 - -      REL-XR   Level - _0 Speed - - - 80    Minutes - _1 METs - - 3.9 2.8           Exercise Comments:   Exercise Goals and Review:  Exercise Goals    Row Name 10/01/19 1437             Exercise Goals   Increase Physical Activity Yes       Intervention Provide advice, education, support and counseling about physical activity/exercise needs.;Develop an individualized exercise prescription for aerobic and resistive training based on initial evaluation findings, risk stratification, comorbidities and participant's personal goals.       Expected Outcomes Short Term: Attend rehab on a regular basis to increase amount of physical activity.;Long Term: Add in home exercise to make exercise part of routine and to increase amount of physical activity.;Long Term: Exercising regularly at least 3-5 days a week.       Increase Strength and Stamina Yes       Intervention Provide advice, education, support and counseling about physical activity/exercise needs.;Develop an individualized exercise prescription for aerobic and resistive training based on initial evaluation findings, risk stratification, comorbidities and participant's personal goals.       Expected Outcomes Short Term: Perform resistance training  exercises routinely during rehab and add in resistance training at home;Short Term: Increase workloads from initial exercise prescription for resistance, speed, and METs.;Long Term: Improve cardiorespiratory fitness, muscular endurance and strength as measured by increased METs and functional capacity (6MWT)       Able to understand and use rate of perceived exertion (RPE) scale Yes       Intervention Provide education and explanation on how to use RPE scale       Expected Outcomes Short Term: Able to use RPE daily in rehab to express subjective intensity level;Long Term:  Able to use RPE to guide intensity level when exercising independently       Able to understand and use Dyspnea scale Yes       Intervention Provide education and explanation on how to use Dyspnea scale       Expected Outcomes Short Term: Able to use Dyspnea scale daily in rehab to express subjective sense of shortness of breath during exertion;Long Term: Able to use Dyspnea scale to guide intensity level when exercising independently       Knowledge and understanding of Target Heart Rate Range (THRR) Yes       Intervention Provide education and explanation of THRR including how the numbers were predicted and where they are located for reference       Expected Outcomes Short Term: Able to state/look up THRR;Short Term: Able to use daily as guideline for intensity in rehab;Long Term: Able to use THRR to govern intensity when exercising independently       Able to check pulse independently Yes       Intervention Provide education and demonstration  on how to check pulse in carotid and radial arteries.;Review the importance of being able to check your own pulse for safety during independent exercise       Expected Outcomes Short Term: Able to explain why pulse checking is important during independent exercise;Long Term: Able to check pulse independently and accurately       Understanding of Exercise Prescription Yes       Intervention  Provide education, explanation, and written materials on patient's individual exercise prescription       Expected Outcomes Short Term: Able to explain program exercise prescription;Long Term: Able to explain home exercise prescription to exercise independently              Exercise Goals Re-Evaluation :  Exercise Goals Re-Evaluation    [place] Name 10/03/19 1113 10/14/19 1605 11/11/19 1524 11/12/19 1138 11/25/19 1420     Exercise Goal Re-Evaluation   Exercise Goals Review Increase Physical Activity;Able to understand and use rate of perceived exertion (RPE) scale;Knowledge and understanding of Target Heart Rate Range (THRR);Understanding of Exercise Prescription;Increase Strength and Stamina;Able to check pulse independently Increase Physical Activity;Increase Strength and Stamina;Understanding of Exercise Prescription Increase Physical Activity;Increase Strength and Stamina;Understanding of Exercise Prescription Increase Physical Activity;Increase Strength and Stamina;Understanding of Exercise Prescription Increase Physical Activity;Increase Strength and Stamina;Understanding of Exercise Prescription   Comments Reviewed RPE and dyspnea scales, THR and program prescription with pt today.  Pt voiced understanding and was given a copy of goals to take home. Ashley [name] is off to a good start in rehab.  She has completed her first three sessions thus far.  We will continue to encourage good attendance.  We will continue to monitor her progress. Ashley [name] has been doing well in rehab.  She has requested that we move her to just the XR as that was the only piece that did not cause her to hurt.  She is up to level 2 on there now.  We will continue to monitor her progress. 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  She uses Biofreeze and has had pain injections for her neck.  She cant have the nerve block due to heart issues.  She does feel ok doing XR 3 times per week.  We discussed  Arthritis exercise class at [facility] as an option when she completes HT. Ashley [name] has increased to level 3 on the XR.  She attends consistently and works at [employer].  Staff will monitor progress.   Expected Outcomes Short: Use RPE daily to regulate intensity. Long: Follow program prescription in THR. Short: Continue to attend regularly Long: Continue to follow program prescription. Short: Try level 3 on XR Long: Continue to attend regularly. Short: continue to exercise as tolerated  Long: build stamina Short:  continue to attend consistently Long:  increase stamina and MET level          Discharge Exercise Prescription (Final Exercise Prescription Changes):  Exercise Prescription Changes - 11/25/19 1400      Response to Exercise   Blood Pressure (Admit) 120/66    Blood Pressure (Exercise) 122/80    Blood Pressure (Exit) 108/64    Heart Rate (Admit) 71 bpm    Heart Rate (Exercise) 91 bpm    Heart Rate (Exit) 80 bpm    Rating of Perceived Exertion (Exercise) 13    Symptoms none    Duration Continue with 30 min of aerobic exercise without signs/symptoms of physical distress.    Intensity THRR  unchanged      Progression   Progression Continue to progress workloads to maintain intensity without signs/symptoms of physical distress.    Average METs 2.75      Resistance Training   Training Prescription Yes    Weight 2 lb    Reps 10-15      Interval Training   Interval Training No      REL-XR   Level 3    Speed 80    Minutes 15    METs 2.8           Nutrition:  Target Goals: Understanding of nutrition guidelines, daily intake of sodium '1500mg'$ , cholesterol '200mg'$ , calories 30% from fat and 7% or less from saturated fats, daily to have 5 or more servings of fruits and vegetables.  Education: Controlling Sodium/Reading Food Labels -Group verbal and written material supporting the discussion of sodium use in heart healthy nutrition. Review and explanation with models,  verbal and written materials for utilization of the food label.   Education: General Nutrition Guidelines/Fats and Fiber: -Group instruction provided by verbal, written material, models and posters to present the general guidelines for heart healthy nutrition. Gives an explanation and review of dietary fats and fiber.   Biometrics:  Pre Biometrics - 10/01/19 1437      Pre Biometrics   Height 5' 6.3" (1.684 m)    Weight 178 lb 9.6 oz (81 kg)    BMI (Calculated) 28.57    Single Leg Stand 5.66 seconds            Nutrition Therapy Plan and Nutrition Goals:  Nutrition Therapy & Goals - 10/15/19 1134      Nutrition Therapy   Diet heart healthy, low Na    Protein (specify units) 75-80g    Fiber 25 grams    Whole Grain Foods 3 servings    Saturated Fats 12 max. grams    Fruits and Vegetables 5 servings/day    Sodium 1.5 grams      Personal Nutrition Goals   Nutrition Goal ST: add protein to breakfast or snack (discussed nuts/seeds)LT: Increase EF - now 30%    Comments Pt reports getting sick every 6 months which lowers her EF - has chronic bronchitis. Pt gets SOB very easily- affects ADLs. cooking or shopping is harder for her at this time due to SOB and fatigue. [place] - cheese biscuit (no butter and removed insides with 1 slice of cheese) with medium sweet tea. L: Fruit, ham, leftover salad S: salt free popcorn and fruit or an oatmeal raisin cookie D: two vegetables (zucchini, onion,  sweet potatoes, and carrots and green beans, yellow squash, avocado) and low salt meat (salmon or other fish), brown rice, low fat dairy. Sometimes ice cream. IBS - onions are trigger for her, will only have it sometimes. Pt has tried to remove cheese and dairy but it didn't seem to help. Doesn't cook with salt and is mindful about processed foods and salt. Pt doesn't like peanut butter. Discussed heart healthy eating as well as pulmonary MNT.      Intervention Plan   Intervention Prescribe, educate and  counsel regarding individualized specific dietary modifications aiming towards targeted core components such as weight, hypertension, lipid management, diabetes, heart failure and other comorbidities.;Nutrition handout(s) given to patient.    Expected Outcomes Short Term Goal: Understand basic principles of dietary content, such as calories, fat, sodium, cholesterol and nutrients.;Short Term Goal: A plan has been developed with personal nutrition goals set during dietitian  appointment.;Long Term Goal: Adherence to prescribed nutrition plan.           Nutrition Assessments:  Nutrition Assessments - 10/01/19 1438      MEDFICTS Scores   Pre Score 48           MEDIFICTS Score Key:          ?70 Need to make dietary changes          40-70 Heart Healthy Diet         ? 40 Therapeutic Level Cholesterol Diet  Nutrition Goals Re-Evaluation:  Nutrition Goals Re-Evaluation    [place] Name 11/12/19 1144             Goals   Comment Continue changes recommended by RD              Nutrition Goals Discharge (Final Nutrition Goals Re-Evaluation):  Nutrition Goals Re-Evaluation - 11/12/19 1144      Goals   Comment Continue changes recommended by RD           Psychosocial: Target Goals: Acknowledge presence or absence of significant depression and/or stress, maximize coping skills, provide positive support system. Participant is able to verbalize types and ability to use techniques and skills needed for reducing stress and depression.   Education: Depression - Provides group verbal and written instruction on the correlation between heart/lung disease and depressed mood, treatment options, and the stigmas associated with seeking treatment.   Education: Sleep Hygiene -Provides group verbal and written instruction about how sleep can affect your health.  Define sleep hygiene, discuss sleep cycles and impact of sleep habits. Review good sleep hygiene tips.     Education: Stress and  Anxiety: - Provides group verbal and written instruction about the health risks of elevated stress and causes of high stress.  Discuss the correlation between heart/lung disease and anxiety and treatment options. Review healthy ways to manage with stress and anxiety.    Initial Review & Psychosocial Screening:  Initial Psych Review & Screening - 09/30/19 1423      Initial Review   Current issues with Current Sleep Concerns;Current Depression;Current Psychotropic Meds;Current Stress Concerns    Source of Stress Concerns Chronic Illness;Family    Comments Constantly dealing with health, does not sleep well and will nap during day,  feels well managed on current medication, Son and family live in [place] and they don't see them as much as they would like to, not on their radar      [place]? Yes   husband   Comments [place] family [place] [place] and they don't see them as much as they would like to, not on their radar (grandkids 8th grade and high school)      Barriers   Psychosocial barriers to participate in program The patient should benefit from training in stress management and relaxation.;Psychosocial barriers identified (see note)      Screening Interventions   Interventions Encouraged to exercise;Provide feedback about the scores to participant;To provide support and resources with identified psychosocial needs    Expected Outcomes Short Term goal: Utilizing psychosocial counselor, staff and physician to assist with identification of specific Stressors or current issues interfering with healing process. Setting desired goal for each stressor or current issue identified.;Long Term Goal: Stressors or current issues are controlled or eliminated.;Short Term goal: Identification and review with participant of any Quality of Life or Depression concerns found by scoring the questionnaire.;Long Term goal: The participant improves quality  of Life and PHQ9 Scores as seen  by post scores and/or verbalization of changes           Quality of Life Scores:   Quality of Life - 10/01/19 1437      Quality of Life   Select Quality of Life      Quality of Life Scores   Health/Function Pre 8.07 %    Socioeconomic Pre 21.57 %    Psych/Spiritual Pre 17.79 %    Family Pre 18 %    GLOBAL Pre 14.37 %          Scores of 19 and below usually indicate a poorer quality of life in these areas.  A difference of  2-3 points is a clinically meaningful difference.  A difference of 2-3 points in the total score of the Quality of Life Index has been associated with significant improvement in overall quality of life, self-image, physical symptoms, and general health in studies assessing change in quality of life.  PHQ-9: Recent Review Flowsheet Data    Depression screen [facility] 2/9 11/05/2019 10/01/2019 11/27/2018 11/09/2017   Decreased Interest 0 2 0 0   Down, Depressed, Hopeless _0 PHQ - 2 Score _1 Altered sleeping 3 3 - 2   Tired, decreased energy 3 3 - 3   Change in appetite 1 0 - 1   Feeling bad or failure about yourself  0 0 - 0   Trouble concentrating 1 1 - 0   Moving slowly or fidgety/restless 0 0 - 0   Suicidal thoughts 0 0 - 0   PHQ-9 Score 9 11 - 7   Difficult doing work/chores Somewhat difficult Very difficult - Very difficult     Interpretation of Total Score  Total Score Depression Severity:  1-4 = Minimal depression, 5-9 = Mild depression, 10-14 = Moderate depression, 15-19 = Moderately severe depression, 20-27 = Severe depression   Psychosocial Evaluation and Intervention:  Psychosocial Evaluation - 09/30/19 1431      Psychosocial Evaluation & Interventions   Interventions Stress management education;Encouraged to exercise with the program and follow exercise prescription    Comments Ashley [name] is coming into rehab for a second time to boost her EF from her heart failure. She enjoyed it last time and is looking forward to doing it again.  She  has a history of depression but is currently feeling well managed for her mood, but she is not sleeping well and wants to talk with her doctor about possibly changing her medication to one that would also help her sleep better.  She is often up at night from pain and trips to bathroom, but she does nap most afternoons.  She is frustrated with her son and his family as they do not see them as much as she would like.  Her son lives in [place], and she used to care for her grandchildren all through their early years and now they rarely see them.  She has an odd reaction to most new medications and has to be careful whenever starting anything new as she does not know how her body witll respond.  He husband is her primary support system but also their neighbors.  She enjoys gardening and helping keep up the house.  Her biggest goal is to improve her EF again and to try to decrease some of her pain, if possible.    Expected Outcomes Short: Attend rehab for exercise to strengthen heart  and boost stamina.  Long: Exercise to help improve sleep.    Continue Psychosocial Services  Follow up required by staff           Psychosocial Re-Evaluation:  Psychosocial Re-Evaluation    [place] Name 11/12/19 1144             Psychosocial Re-Evaluation   Current issues with Current Stress Concerns;Current Sleep Concerns       Comments Ashley [name] still does not sleep well consistently due to health conditions.  She has spoken to her Dr about sleep.  She doesnt want to take more medication.  She does take naps during the day.  She feels her mental health is as good as it can get.  She does have hobbies she enjoys and also has friends over for dinner and to play cards.       Expected Outcomes Short:  continue to do social activities Long:  maintain positive outlook              Psychosocial Discharge (Final Psychosocial Re-Evaluation):  Psychosocial Re-Evaluation - 11/12/19 1144      Psychosocial Re-Evaluation   Current  issues with Current Stress Concerns;Current Sleep Concerns    Comments Ashley [name] still does not sleep well consistently due to health conditions.  She has spoken to her Dr about sleep.  She doesnt want to take more medication.  She does take naps during the day.  She feels her mental health is as good as it can get.  She does have hobbies she enjoys and also has friends over for dinner and to play cards.    Expected Outcomes Short:  continue to do social activities Long:  maintain positive outlook           Vocational Rehabilitation: Provide vocational rehab assistance to qualifying candidates.   Vocational Rehab Evaluation & Intervention:  Vocational Rehab - 09/30/19 1423      Initial Vocational Rehab Evaluation & Intervention   Assessment shows need for Vocational Rehabilitation No           Education: Education Goals: Education classes will be provided on a variety of topics geared toward better understanding of heart health and risk factor modification. Participant will state understanding/return demonstration of topics presented as noted by education test scores.  Learning Barriers/Preferences:  Learning Barriers/Preferences - 09/30/19 1417      Learning Barriers/Preferences   Learning Barriers --   arthritis and trigger finger can cause her to go slowly   Learning Preferences Skilled Demonstration           General Cardiac Education Topics:  AED/CPR: - Group verbal and written instruction with the use of models to demonstrate the basic use of the AED with the basic ABC's of resuscitation.   Anatomy & Physiology of the Heart: - Group verbal and written instruction and models provide basic cardiac anatomy and physiology, with the coronary electrical and arterial systems. Review of Valvular disease and Heart Failure   Cardiac Procedures: - Group verbal and written instruction to review commonly prescribed medications for heart disease. Reviews the medication, class of  the drug, and side effects. Includes the steps to properly store meds and maintain the prescription regimen. (beta blockers and nitrates)   Cardiac Medications I: - Group verbal and written instruction to review commonly prescribed medications for heart disease. Reviews the medication, class of the drug, and side effects. Includes the steps to properly store meds and maintain the prescription regimen.   Cardiac Medications II: -Group  verbal and written instruction to review commonly prescribed medications for heart disease. Reviews the medication, class of the drug, and side effects. (all other drug classes)    Go Sex-Intimacy & Heart Disease, Get SMART - Goal Setting: - Group verbal and written instruction through game format to discuss heart disease and the return to sexual intimacy. Provides group verbal and written material to discuss and apply goal setting through the application of the S.M.A.R.T. Method.   Other Matters of the Heart: - Provides group verbal, written materials and models to describe Stable Angina and Peripheral Artery. Includes description of the disease process and treatment options available to the cardiac patient.   Infection Prevention: - Provides verbal and written material to individual with discussion of infection control including proper hand washing and proper equipment cleaning during exercise session.   Cardiac Rehab from 10/01/2019 in [facility] Cardiac and Pulmonary Rehab  Date 10/01/19  Educator [facility]  Instruction Review Code 1- Verbalizes Understanding      Falls Prevention: - Provides verbal and written material to individual with discussion of falls prevention and safety.   Cardiac Rehab from 10/01/2019 in [facility] Cardiac and Pulmonary Rehab  Date 10/01/19  Educator [facility]  Instruction Review Code 1- Verbalizes Understanding      Other: -Provides group and verbal instruction on various topics (see comments)   Knowledge Questionnaire Score:  Knowledge  Questionnaire Score - 10/01/19 1438      Knowledge Questionnaire Score   Pre Score 20/26 Education Focus: Exercise, Nutrition, Anatomy           Core Components/Risk Factors/Patient Goals at Admission:  Personal Goals and Risk Factors at Admission - 10/01/19 1438      Core Components/Risk Factors/Patient Goals on Admission    Weight Management Yes;Weight Loss    Intervention Weight Management: Develop a combined nutrition and exercise program designed to reach desired caloric intake, while maintaining appropriate intake of nutrient and fiber, sodium and fats, and appropriate energy expenditure required for the weight goal.;Weight Management: Provide education and appropriate resources to help participant work on and attain dietary goals.    Admit Weight 178 lb 9.6 oz (81 kg)    Goal Weight: Short Term 173 lb (78.5 kg)    Goal Weight: Long Term 168 lb (76.2 kg)    Expected Outcomes Short Term: Continue to assess and modify interventions until short term weight is achieved;Long Term: Adherence to nutrition and physical activity/exercise program aimed toward attainment of established weight goal;Weight Loss: Understanding of general recommendations for a balanced deficit meal plan, which promotes 1-2 lb weight loss per week and includes a negative energy balance of [phone] kcal/d;Understanding recommendations for meals to include 15-35% energy as protein, 25-35% energy from fat, 35-60% energy from carbohydrates, less than 27m of dietary cholesterol, 20-35 gm of total fiber daily;Understanding of distribution of calorie intake throughout the day with the consumption of 4-5 meals/snacks    Heart Failure Yes    Intervention Provide a combined exercise and nutrition program that is supplemented with education, support and counseling about heart failure. Directed toward relieving symptoms such as shortness of breath, decreased exercise tolerance, and extremity edema.    Expected Outcomes Improve  functional capacity of life;Short term: Attendance in program 2-3 days a week with increased exercise capacity. Reported lower sodium intake. Reported increased fruit and vegetable intake. Reports medication compliance.;Short term: Daily weights obtained and reported for increase. Utilizing diuretic protocols set by physician.;Long term: Adoption of self-care skills and reduction of barriers for  early signs and symptoms recognition and intervention leading to self-care maintenance.    Lipids Yes    Intervention Provide education and support for participant on nutrition & aerobic/resistive exercise along with prescribed medications to achieve LDL <27m, HDL >459m    Expected Outcomes Short Term: Participant states understanding of desired cholesterol values and is compliant with medications prescribed. Participant is following exercise prescription and nutrition guidelines.;Long Term: Cholesterol controlled with medications as prescribed, with individualized exercise RX and with personalized nutrition plan. Value goals: LDL < 7032mHDL > 40 mg.           Education:Diabetes - Individual verbal and written instruction to review signs/symptoms of diabetes, desired ranges of glucose level fasting, after meals and with exercise. Acknowledge that pre and post exercise glucose checks will be done for 3 sessions at entry of program.   Education: Know Your Numbers and Risk Factors: -Group verbal and written instruction about important numbers in your health.  Discussion of what are risk factors and how they play a role in the disease process.  Review of Cholesterol, Blood Pressure, Diabetes, and BMI and the role they play in your overall health.   Core Components/Risk Factors/Patient Goals Review:   Goals and Risk Factor Review    Row Name 11/12/19 1134             Core Components/Risk Factors/Patient Goals Review   Personal Goals Review Weight Management/Obesity;Heart Failure;Hypertension;Lipids        Review Ashley [name] is taking all meds as directed.  She checks her BP at home if she doesnt feel well.  She has more trouble with low BP than high.  She is monitoring weight at home.  Her HF symptoms are the same.  She still has chest pain and has been working with her Dr on this since 2012.  She is watching her diet for GI reasons.       Expected Outcomes Short: continue to exercise consistently and monitor risk factors Long: manage risk factors              Core Components/Risk Factors/Patient Goals at Discharge (Final Review):   Goals and Risk Factor Review - 11/12/19 1134      Core Components/Risk Factors/Patient Goals Review   Personal Goals Review Weight Management/Obesity;Heart Failure;Hypertension;Lipids    Review Ashley [name] is taking all meds as directed.  She checks her BP at home if she doesnt feel well.  She has more trouble with low BP than high.  She is monitoring weight at home.  Her HF symptoms are the same.  She still has chest pain and has been working with her Dr on this since 2012.  She is watching her diet for GI reasons.    Expected Outcomes Short: continue to exercise consistently and monitor risk factors Long: manage risk factors           ITP Comments:  ITP Comments    Row Name 09/30/19 1437 10/01/19 1434 10/03/19 1113 10/29/19 0638 10/29/19 1240   ITP Comments Completed virtual orientation today.  EP evaluation is scheduled for Thursday 5/19 at 1130.  [facility] Completed 6MWT and gym orientation.  Initial ITP created and sent for review to Dr. MarEmily Filbertedical Director. First full day of exercise!  Patient was oriented to gym and equipment including functions, settings, policies, and procedures.  Patient's individual exercise prescription and treatment plan were reviewed.  All starting workloads were established based on the  results of the 6 minute walk test done at initial orientation visit.  The plan for  exercise progression was also introduced and progression will be customized based on patient's performance and goals 30 Day review completed. Medical Director ITP review done, changes made as directed, and signed approval by Medical Director. Pt has not attended since 10/17/19.   [facility] [place] Name 11/11/19 1523 11/26/19 1102         ITP Comments Ashley [name] came on 6/28 but had to send her home due to her lack of toe covered shoes. 30 Day review completed. Medical Director ITP review done, changes made as directed, and signed approval by Medical Director.             Comments:

## 2019-11-27 ENCOUNTER — Telehealth: Payer: Self-pay | Admitting: Pulmonary Disease

## 2019-11-27 NOTE — Telephone Encounter (Signed)
Please doBreathing test actually looked very good.  The only issue is that she does not take a deep breath to wait.  This is noted by one of the volumes in the breathing test.  But other than that the test look very good.

## 2019-11-27 NOTE — Telephone Encounter (Signed)
Pt is aware of results and voiced her understanding. Nothing further is needed.  

## 2019-11-27 NOTE — Telephone Encounter (Signed)
Called and spoke to pt, who is requesting PFT results.   Dr. [name], please advise. Thanks

## 2019-11-28 ENCOUNTER — Other Ambulatory Visit: Payer: Self-pay

## 2019-11-28 DIAGNOSIS — I5022 Chronic systolic (congestive) heart failure: Secondary | ICD-10-CM | POA: Diagnosis not present

## 2019-11-28 NOTE — Progress Notes (Signed)
Daily Session Note  Patient Details  Name: Ashley [name] MRN: [id number] Date of Birth: 06-Apr-1946 Referring Provider:     Cardiac Rehab from 10/01/2019 in [facility] Cardiac and Pulmonary Rehab  Referring Provider [name] MD      Encounter Date: 11/28/2019  Check In:  Session Check In - 11/28/19 1106      Check-In   Supervising physician immediately available to respond to emergencies See telemetry face sheet for immediately available ER MD    Location [facility]-Cardiac & Pulmonary Rehab    Staff Present Basilia Jumbo, RN, BSN;Jessica Luan Pulling, MA, RCEP, CCRP, CCET;Meredith La Harpe, RN BSN;Joseph [place] [employer]    Virtual Visit No    Medication changes reported     No    Fall or balance concerns reported    No    Tobacco Cessation No Change    Warm-up and Cool-down Performed on first and last piece of equipment    Resistance Training Performed Yes    VAD Patient? No    PAD/SET Patient? No      Pain Assessment   Currently in Pain? No/denies              Social History   Tobacco Use  Smoking Status Never Smoker  Smokeless Tobacco Never Used  Tobacco Comment   Lived with smokers x 23 yrs, smoked herself "for a week"    Goals Met:  Independence with exercise equipment Exercise tolerated well No report of cardiac concerns or symptoms  Goals Unmet:  Not Applicable  Comments: Pt able to follow exercise prescription today without complaint.  Will continue to monitor for progression.    Dr. Emily Filbert is Medical Director for [facility].

## 2019-11-30 ENCOUNTER — Other Ambulatory Visit: Payer: Self-pay | Admitting: Family Medicine

## 2019-11-30 DIAGNOSIS — E559 Vitamin D deficiency, unspecified: Secondary | ICD-10-CM

## 2019-11-30 DIAGNOSIS — E782 Mixed hyperlipidemia: Secondary | ICD-10-CM

## 2019-11-30 DIAGNOSIS — N1831 Chronic kidney disease, stage 3a: Secondary | ICD-10-CM

## 2019-11-30 DIAGNOSIS — R739 Hyperglycemia, unspecified: Secondary | ICD-10-CM

## 2019-12-01 ENCOUNTER — Other Ambulatory Visit: Payer: Self-pay

## 2019-12-01 ENCOUNTER — Ambulatory Visit (INDEPENDENT_AMBULATORY_CARE_PROVIDER_SITE_OTHER): Payer: Medicare Other

## 2019-12-01 ENCOUNTER — Other Ambulatory Visit (INDEPENDENT_AMBULATORY_CARE_PROVIDER_SITE_OTHER): Payer: Medicare Other

## 2019-12-01 ENCOUNTER — Encounter: Payer: Medicare Other | Admitting: *Deleted

## 2019-12-01 DIAGNOSIS — E782 Mixed hyperlipidemia: Secondary | ICD-10-CM | POA: Diagnosis not present

## 2019-12-01 DIAGNOSIS — I5022 Chronic systolic (congestive) heart failure: Secondary | ICD-10-CM

## 2019-12-01 DIAGNOSIS — Z0000 Encounter for general adult medical examination without abnormal findings: Secondary | ICD-10-CM

## 2019-12-01 DIAGNOSIS — N1831 Chronic kidney disease, stage 3a: Secondary | ICD-10-CM

## 2019-12-01 DIAGNOSIS — R739 Hyperglycemia, unspecified: Secondary | ICD-10-CM

## 2019-12-01 LAB — COMPREHENSIVE METABOLIC PANEL
ALT: 26 U/L (ref 0–35)
AST: 25 U/L (ref 0–37)
Albumin: 4.2 g/dL (ref 3.5–5.2)
Alkaline Phosphatase: 95 U/L (ref 39–117)
BUN: 19 mg/dL (ref 6–23)
CO2: 29 mEq/L (ref 19–32)
Calcium: 9.8 mg/dL (ref 8.4–10.5)
Chloride: 105 mEq/L (ref 96–112)
Creatinine, Ser: 0.88 mg/dL (ref 0.40–1.20)
GFR: 62.75 mL/min (ref >60.00)
Glucose, Bld: 96 mg/dL (ref 70–99)
Potassium: 4 mEq/L (ref 3.5–5.1)
Sodium: 141 mEq/L (ref 135–145)
Total Bilirubin: 0.3 mg/dL (ref 0.2–1.2)
Total Protein: 7.6 g/dL (ref 6.0–8.3)

## 2019-12-01 LAB — LIPID PANEL
Cholesterol: 232 mg/dL — ABNORMAL HIGH (ref 0–200)
HDL: 47.7 mg/dL (ref >39.00)
LDL Cholesterol: 149 mg/dL — ABNORMAL HIGH (ref 0–99)
NonHDL: 183.88
Total CHOL/HDL Ratio: 5
Triglycerides: 172 mg/dL — ABNORMAL HIGH (ref 0.0–149.0)
VLDL: 34.4 mg/dL (ref 0.0–40.0)

## 2019-12-01 LAB — CBC WITH DIFFERENTIAL/PLATELET
Basophils Absolute: 0.1 10*3/uL (ref 0.0–0.1)
Basophils Relative: 0.9 % (ref 0.0–3.0)
Eosinophils Absolute: 0.1 10*3/uL (ref 0.0–0.7)
Eosinophils Relative: 1.4 % (ref 0.0–5.0)
HCT: 38.3 % (ref 36.0–46.0)
Hemoglobin: 12.8 g/dL (ref 12.0–15.0)
Lymphocytes Relative: 28.5 % (ref 12.0–46.0)
Lymphs Abs: 2.4 10*3/uL (ref 0.7–4.0)
MCHC: 33.3 g/dL (ref 30.0–36.0)
MCV: 90.5 fl (ref 78.0–100.0)
Monocytes Absolute: 0.8 10*3/uL (ref 0.1–1.0)
Monocytes Relative: 9.1 % (ref 3.0–12.0)
Neutro Abs: 5.1 10*3/uL (ref 1.4–7.7)
Neutrophils Relative %: 60.1 % (ref 43.0–77.0)
Platelets: 218 10*3/uL (ref 150.0–400.0)
RBC: 4.24 Mil/uL (ref 3.87–5.11)
RDW: 12.7 % (ref 11.5–15.5)
WBC: 8.5 10*3/uL (ref 4.0–10.5)

## 2019-12-01 LAB — VITAMIN D 25 HYDROXY (VIT D DEFICIENCY, FRACTURES): VITD: 33.99 ng/mL (ref 30.00–100.00)

## 2019-12-01 LAB — HEMOGLOBIN A1C: Hgb A1c MFr Bld: 5.9 % (ref 4.6–6.5)

## 2019-12-01 NOTE — Patient Instructions (Signed)
Ms. Ashley [name] , Thank you for taking time to come for your Medicare Wellness Visit. I appreciate your ongoing commitment to your health goals. Please review the following plan we discussed and let me know if I can assist you in the future.   Screening recommendations/referrals: Colonoscopy: Up to date, completed 02/05/2018, due 01/2028 Mammogram: Up to date, completed 02/24/2019, due 02/2020 Bone Density: Up to date, completed 01/25/2018, due 9/2021dec Recommended yearly ophthalmology/optometry visit for glaucoma screening and checkup Recommended yearly dental visit for hygiene and checkup  Vaccinations: Influenza vaccine: due Fall 2021 Pneumococcal vaccine: Completed series Tdap vaccine: decline- insurance/financial Shingles vaccine: due, check with insurance regarding coverage   Covid-19:declined  Advanced directives: Please bring a copy of your POA (Power of Attorney) and/or Living Will to your next appointment.   Conditions/risks identified: hyperlipidemia  Next appointment: Follow up in one year for your annual wellness visit    Preventive Care 65 Years and Older, Female Preventive care refers to lifestyle choices and visits with your health care provider that can promote health and wellness. What does preventive care include?  A yearly physical exam. This is also called an annual well check.  Dental exams once or twice a year.  Routine eye exams. Ask your health care provider how often you should have your eyes checked.  Personal lifestyle choices, including:  Daily care of your teeth and gums.  Regular physical activity.  Eating a healthy diet.  Avoiding tobacco and drug use.  Limiting alcohol use.  Practicing safe sex.  Taking low-dose aspirin every day.  Taking vitamin and mineral supplements as recommended by your health care provider. What happens during an annual well check? The services and screenings done by your health care provider during your annual  well check will depend on your age, overall health, lifestyle risk factors, and family history of disease. Counseling  Your health care provider may ask you questions about your:  Alcohol use.  Tobacco use.  Drug use.  Emotional well-being.  Home and relationship well-being.  Sexual activity.  Eating habits.  History of falls.  Memory and ability to understand (cognition).  Work and work Statistician.  Reproductive health. Screening  You may have the following tests or measurements:  Height, weight, and BMI.  Blood pressure.  Lipid and cholesterol levels. These may be checked every 5 years, or more frequently if you are over 52 years old.  Skin check.  Lung cancer screening. You may have this screening every year starting at age 53 if you have a 30-pack-year history of smoking and currently smoke or have quit within the past 15 years.  Fecal occult blood test (FOBT) of the stool. You may have this test every year starting at age 32.  Flexible sigmoidoscopy or colonoscopy. You may have a sigmoidoscopy every 5 years or a colonoscopy every 10 years starting at age 24.  Hepatitis C blood test.  Hepatitis B blood test.  Sexually transmitted disease (STD) testing.  Diabetes screening. This is done by checking your blood sugar (glucose) after you have not eaten for a while (fasting). You may have this done every 1-3 years.  Bone density scan. This is done to screen for osteoporosis. You may have this done starting at age 20.  Mammogram. This may be done every 1-2 years. Talk to your health care provider about how often you should have regular mammograms. Talk with your health care provider about your test results, treatment options, and if necessary, the need for more tests.  Vaccines  Your health care provider may recommend certain vaccines, such as:  Influenza vaccine. This is recommended every year.  Tetanus, diphtheria, and acellular pertussis (Tdap, Td) vaccine.  You may need a Td booster every 10 years.  Zoster vaccine. You may need this after age 76.  Pneumococcal 13-valent conjugate (PCV13) vaccine. One dose is recommended after age 13.  Pneumococcal polysaccharide (PPSV23) vaccine. One dose is recommended after age 77. Talk to your health care provider about which screenings and vaccines you need and how often you need them. This information is not intended to replace advice given to you by your health care provider. Make sure you discuss any questions you have with your health care provider. Document Released: 05/28/2015 Document Revised: 01/19/2016 Document Reviewed: 03/02/2015 Elsevier Interactive Patient Education  2017 [place] Prevention in the Home Falls can cause injuries. They can happen to people of all ages. There are many things you can do to make your home safe and to help prevent falls. What can I do on the outside of my home?  Regularly fix the edges of walkways and driveways and fix any cracks.  Remove anything that might make you trip as you walk through a door, such as a raised step or threshold.  Trim any bushes or trees on the path to your home.  Use bright outdoor lighting.  Clear any walking paths of anything that might make someone trip, such as rocks or tools.  Regularly check to see if handrails are loose or broken. Make sure that both sides of any steps have handrails.  Any raised decks and porches should have guardrails on the edges.  Have any leaves, snow, or ice cleared regularly.  Use sand or salt on walking paths during winter.  Clean up any spills in your garage right away. This includes oil or grease spills. What can I do in the bathroom?  Use night lights.  Install grab bars by the toilet and in the tub and shower. Do not use towel bars as grab bars.  Use non-skid mats or decals in the tub or shower.  If you need to sit down in the shower, use a plastic, non-slip stool.  Keep the  floor dry. Clean up any water that spills on the floor as soon as it happens.  Remove soap buildup in the tub or shower regularly.  Attach bath mats securely with double-sided non-slip rug tape.  Do not have throw rugs and other things on the floor that can make you trip. What can I do in the bedroom?  Use night lights.  Make sure that you have a light by your bed that is easy to reach.  Do not use any sheets or blankets that are too big for your bed. They should not hang down onto the floor.  Have a firm chair that has side arms. You can use this for support while you get dressed.  Do not have throw rugs and other things on the floor that can make you trip. What can I do in the kitchen?  Clean up any spills right away.  Avoid walking on wet floors.  Keep items that you use a lot in easy-to-reach places.  If you need to reach something above you, use a strong step stool that has a grab bar.  Keep electrical cords out of the way.  Do not use floor polish or wax that makes floors slippery. If you must use wax, use non-skid floor wax.  Do not have throw rugs and other things on the floor that can make you trip. What can I do with my stairs?  Do not leave any items on the stairs.  Make sure that there are handrails on both sides of the stairs and use them. Fix handrails that are broken or loose. Make sure that handrails are as long as the stairways.  Check any carpeting to make sure that it is firmly attached to the stairs. Fix any carpet that is loose or worn.  Avoid having throw rugs at the top or bottom of the stairs. If you do have throw rugs, attach them to the floor with carpet tape.  Make sure that you have a light switch at the top of the stairs and the bottom of the stairs. If you do not have them, ask someone to add them for you. What else can I do to help prevent falls?  Wear shoes that:  Do not have high heels.  Have rubber bottoms.  Are comfortable and fit  you well.  Are closed at the toe. Do not wear sandals.  If you use a stepladder:  Make sure that it is fully opened. Do not climb a closed stepladder.  Make sure that both sides of the stepladder are locked into place.  Ask someone to hold it for you, if possible.  Clearly mark and make sure that you can see:  Any grab bars or handrails.  First and last steps.  Where the edge of each step is.  Use tools that help you move around (mobility aids) if they are needed. These include:  Canes.  Walkers.  Scooters.  Crutches.  Turn on the lights when you go into a dark area. Replace any light bulbs as soon as they burn out.  Set up your furniture so you have a clear path. Avoid moving your furniture around.  If any of your floors are uneven, fix them.  If there are any pets around you, be aware of where they are.  Review your medicines with your doctor. Some medicines can make you feel dizzy. This can increase your chance of falling. Ask your doctor what other things that you can do to help prevent falls. This information is not intended to replace advice given to you by your health care provider. Make sure you discuss any questions you have with your health care provider. Document Released: 02/25/2009 Document Revised: 10/07/2015 Document Reviewed: 06/05/2014 Elsevier Interactive Patient Education  2017 [employer].

## 2019-12-01 NOTE — Progress Notes (Signed)
Daily Session Note  Patient Details  Name: Ashley [name] MRN: [id number] Date of Birth: [birth year] Referring Provider:     Cardiac Rehab from 10/01/2019 in [facility]  Referring Provider [name] MD      Encounter Date: 12/01/2019  Check In:  Session Check In - 12/01/19 1139      Check-In   Location [facility]-Cardiac & Pulmonary Rehab    Staff Present Nyoka Cowden, RN, BSN, MA;Joseph Hood Sharren [place], [place] Exercise Physiologist;Kelly Amedeo Plenty, [place], ACSM CEP, Exercise Physiologist;Leslie Castrejon RN, BSN    Virtual Visit No    Medication changes reported     No    Fall or balance concerns reported    No    Tobacco Cessation No Change    Warm-up and Cool-down Performed on first and last piece of equipment    Resistance Training Performed Yes    VAD Patient? No    PAD/SET Patient? No      Pain Assessment   Currently in Pain? No/denies              Social History   Tobacco Use  Smoking Status Never Smoker  Smokeless Tobacco Never Used  Tobacco Comment   Lived with smokers x 23 yrs, smoked herself "for a week"    Goals Met:  Independence with exercise equipment Exercise tolerated well No report of cardiac concerns or symptoms Strength training completed today  Goals Unmet:  Not Applicable  Comments: Pt able to follow exercise prescription today without complaint.  Will continue to monitor for progression.    Dr. Emily Filbert is Medical Director for [facility].

## 2019-12-01 NOTE — Progress Notes (Signed)
PCP notes:  Health Maintenance: Covid- declined due to heart condition   Abnormal Screenings: none   Patient concerns: Increased anxiety  Yeast infection under belly need cream prescribed  Nurse concerns: none   Next PCP appt: 12/05/2019 @ 3 pm

## 2019-12-01 NOTE — Progress Notes (Signed)
Subjective:   Ashley [name] is a 74 y.o. female who presents for Medicare Annual (Subsequent) preventive examination.  Review of Systems: N/A      I connected with the patient today by telephone and verified that I am speaking with the correct person using two identifiers. Location patient: home Location nurse: work Persons participating in the virtual visit: patient, Marine scientist.   I discussed the limitations, risks, security and privacy concerns of performing an evaluation and management service by telephone and the availability of in person appointments. I also discussed with the patient that there may be a patient responsible charge related to this service. The patient expressed understanding and verbally consented to this telephonic visit.    Interactive audio and video telecommunications were attempted between this nurse and patient, however failed, due to patient having technical difficulties OR patient did not have access to video capability.  We continued and completed visit with audio only.     Cardiac Risk Factors include: advanced age (>55men, >65 women);dyslipidemia     Objective:    Today's Vitals   12/01/19 1441  PainSc: 5    There is no height or weight on file to calculate BMI.  Advanced Directives 12/01/2019 09/30/2019 08/31/2019 11/05/2018 10/16/2018 06/27/2018 11/09/2017  Does Patient Have a Medical Advance Directive? Yes Yes No Yes Yes Yes Yes  Type of Paramedic of [place];Living will - - [place];Living will [place];Living will Living will [place];Living will  Does patient want to make changes to medical advance directive? - No - Patient declined - No - Patient declined No - Patient declined - -  Copy of [place] in Chart? No - copy requested - - No - copy requested Yes - validated most recent copy scanned in chart (See row information) - No - copy requested    Would patient like information on creating a medical advance directive? - - No - Patient declined - - No - Patient declined -  Pre-existing out of facility DNR order (yellow form or pink MOST form) - - - - - - -    Current Medications (verified) Outpatient Encounter Medications as of 12/01/2019  Medication Sig  . acetaminophen (TYLENOL) 500 MG tablet Take 500 mg by mouth daily as needed for moderate pain or headache (PAIN).   . AMBULATORY NON FORMULARY MEDICATION Medication Name:  1 squatty potty to use as needed  . Ascorbic Acid (VITAMIN C) 1000 MG tablet Take 1,000 mg by mouth See admin instructions. Take 1000 mg daily, may take an additional 1000 mg as needed for cold symptoms  . aspirin EC 81 MG tablet Take 81 mg by mouth at bedtime.   . benzonatate (TESSALON) 200 MG capsule Take 1 capsule (200 mg total) by mouth 3 (three) times daily as needed for cough.  . carvedilol (COREG) 3.125 MG tablet Take 1 tablet (3.125 mg total) by mouth 2 (two) times daily with a meal. (Patient taking differently: Take 3.125 mg by mouth 2 (two) times daily with a meal. 1/2 tablet BID)  . carvedilol (COREG) 6.25 MG tablet TAKE 1 TABLET (6.25 MG TOTAL) BY MOUTH 2 (TWO) TIMES DAILY WITH A MEAL.  . cholecalciferol (VITAMIN D) 1000 units tablet Take 1,000 Units by mouth daily.   . Coenzyme Q10 (COQ-10) 100 MG CAPS Take 100 mg by mouth daily.  Marland Kitchen dextromethorphan (DELSYM) 30 MG/5ML liquid Take 15 mg by mouth daily as needed for cough.   Marland Kitchen  diclofenac Sodium (VOLTAREN) 1 % GEL Apply 1 application topically 4 (four) times daily.  Marland Kitchen ezetimibe (ZETIA) 10 MG tablet Take 1 tablet (10 mg total) by mouth daily.  . Fluticasone Furoate (ARNUITY ELLIPTA) 100 MCG/ACT AEPB Inhale 1 puff into the lungs daily.  Marland Kitchen GARLIC PO Take 1 capsule by mouth daily.  Marland Kitchen guaiFENesin (MUCINEX) 600 MG 12 hr tablet Take 600 mg by mouth daily.  Marland Kitchen levalbuterol (XOPENEX HFA) 45 MCG/ACT inhaler Inhale 2 puffs into the lungs every 4 (four) hours as needed  for wheezing or shortness of breath. (Patient taking differently: Inhale 2 puffs into the lungs every 4 (four) hours as needed for wheezing or shortness of breath. )  . LORazepam (ATIVAN) 0.5 MG tablet Take 0.5-1 tablets (0.25-0.5 mg total) by mouth at bedtime as needed for anxiety or sleep.  . Menthol, Topical Analgesic, (BIOFREEZE ROLL-ON EX) Apply topically.  . miconazole (MICOTIN) 2 % powder Apply 1 application topically as needed (yeast).  . nystatin (MYCOSTATIN) 100000 UNIT/ML suspension Take 5 mLs (500,000 Units total) by mouth 3 (three) times daily.  Marland Kitchen nystatin cream (MYCOSTATIN) Apply 1 application topically as needed for dry skin (yeast infection).  . potassium chloride (KLOR-CON) 10 MEQ tablet Take 1 tablet (10 mEq total) by mouth daily as needed (with laxis).  Marland Kitchen pyridOXINE (VITAMIN B-6) 100 MG tablet Take 100 mg by mouth daily.  . Red Yeast Rice Extract (RED YEAST RICE PO) Take 1 capsule by mouth in the morning and at bedtime.   . sacubitril-valsartan (ENTRESTO) 24-26 MG Take 1 tablet by mouth 2 (two) times daily.  Marland Kitchen spironolactone (ALDACTONE) 25 MG tablet Take 0.5 tablets (12.5 mg total) by mouth daily for 30 days. (Patient taking differently: Take 25 mg by mouth daily. 1 tablet every other day)  . triamcinolone (NASACORT) 55 MCG/ACT AERO nasal inhaler Place 2 sprays into the nose daily.  . vitamin E 400 UNIT capsule Take 400 Units by mouth daily.  Marland Kitchen torsemide (DEMADEX) 10 MG tablet Take 1 tablet (10 mg total) by mouth every other day.  . [DISCONTINUED] bisoprolol (ZEBETA) 5 MG tablet Take 5 mg by mouth daily.   No facility-administered encounter medications on file as of 12/01/2019.    Allergies (verified) Atorvastatin; Chlorhexidine; Codeine; Cyclobenzaprine; Cymbalta [duloxetine hcl]; Diflucan [fluconazole]; Dilaudid [hydromorphone hcl]; Furosemide; Influenza vac split [flu virus vaccine]; Latex; Morphine and related; Pravastatin; Prevacid [lansoprazole]; Prevnar [pneumococcal  13-val conj vacc]; Rosuvastatin; Simvastatin; Tape; Metaxalone; Antihistamines, chlorpheniramine-type; Betadine [povidone iodine]; Onion; Other; Penicillins; Red dye; Sertraline; and Sulfasalazine   History: Past Medical History:  Diagnosis Date  . Abdominal aortic atherosclerosis ([place]) 11/2015   by CT  . Allergy   . Anemia   . Anxiety   . Automatic implantable cardioverter-defibrillator in situ 2012   a. MDT single lead ICD.  Marland Kitchen Bronchiectasis    a. possibly mild, treat URIs aggressively  . Cancer (HCC)    basal cell, arms & neck  . CHF (congestive heart failure) ([place])   . Chronic sinusitis   . COPD (chronic obstructive pulmonary disease) ([place])   . Depression   . Dyspnea   . Essential hypertension   . Fibrocystic breast disease   . Fibromyalgia   . GERD (gastroesophageal reflux disease)   . H/O multiple allergies 10/10/2013  . Hemorrhoids   . History of diverticulitis of colon   . History of pneumonia 2012  . Insomnia   . Irritable bowel syndrome   . Laryngeal nodule    Dr. Thomasena Edis  .  Migraine without aura, without mention of intractable migraine without mention of status migrainosus   . Mitral regurgitation   . Nonischemic cardiomyopathy ([place])    a. Recovered LV fxn - prev as low as 25%, now 50-55% by echo 04/2014;  b. 2012 s/p MDT [id number] [place] VR single lead ICD.  Marland Kitchen Osteoarthritis    knees, back, hands, low back,   . Osteoporosis 01/2013   T-2.7 spine (01/2016)  . PONV (postoperative nausea and vomiting)   . sigmoid diverticulitis with perforation, abscess and fistula 09/30/2013  . Sleep apnea 2010   borderline, inconclusive- /w [place]    Past Surgical History:  Procedure Laterality Date  . ABDOMINAL HYSTERECTOMY  1987   w/BSO  . ANAL RECTAL MANOMETRY N/A 02/02/2016   Procedure: ANO RECTAL MANOMETRY;  Surgeon: Mauri Pole, MD;  Location: [facility];  Service: Endoscopy;  Laterality: N/A;  . APPENDECTOMY    . AUGMENTATION MAMMAPLASTY    . BIV UPGRADE  N/A 11/04/2018   Procedure: BIV ICD  UPGRADE;  Surgeon: Evans Lance, MD;  Location: [facility] [facility];  Service: Cardiovascular;  Laterality: N/A;  . breast cystectomies     due to FCBD  . BREAST MASS EXCISION  01/2010   Left-fibrocystic change w/intraductal papilloma, no malignancy  . carotid US  43/1540   0-86% LICA, normal RICA  . [employer] Bunionectomy  11/04/08   Right Great Toe (Dr. Beola Cord)  . COLONOSCOPY  01/2018   1 small benign polyp, diverticulosis, healthy anastomosis [name] @ [facility])  . COLOSTOMY N/A 10/02/2013   DESCENDING COLOSTOMY;  Surgeon: Imogene Burn. Tsuei, [place] N/A 02/10/2014   [name], MD  . EMG/MCV  10/16/01   + mild carpal tunnel  . ESOPHAGOGASTRODUODENOSCOPY  09/2014   small HH, irregular Z line, fundic gland polyps with mild gastritis ([place])  . Flex laryngoscopy  06/11/06   ([place]) nml  . IMPLANTABLE CARDIOVERTER DEFIBRILLATOR IMPLANT N/A 07/14/2011   MDT single chamber ICD  . KNEE ARTHROSCOPY W/ ORIF     Left; "meniscus tear"  . LASIK     left eye  . MANDIBLE SURGERY     "got about 6 pins in the bottom of my jaw"  . NCS  11/2014   L ulnar neuropathy, mild B median nerve entrapment ([place])  . OSTEOTOMY     Left foot  . PARTIAL COLECTOMY N/A 10/02/2013   PARTIAL COLECTOMY;  Surgeon: Imogene Burn. Tsuei, MD  . RIGHT/LEFT HEART CATH AND CORONARY ANGIOGRAPHY N/A 10/16/2018   Procedure: RIGHT/LEFT HEART CATH AND CORONARY ANGIOGRAPHY;  Surgeon: Wellington Hampshire, MD;  Location: [place] [facility];  Service: Cardiovascular;  Laterality: N/A;  . sinus surgery     x3 with balloon   . TOE AMPUTATION     "toe beside baby toe on left foot; got gangrene from corn"  . TONSILLECTOMY AND ADENOIDECTOMY     Family History  Problem Relation Age of Onset  . Lung cancer Father        + smoker  . Colon polyps Father   . Dementia Mother   . Osteoporosis Mother        Lumbar spine  . Stroke Mother        x 5 @ 84 YOA  . Arthritis Mother        hands    . Emphysema Mother   . Alcohol abuse Paternal Grandfather   . Stroke Paternal Grandfather        ?  Marland Kitchen  Heart disease Paternal Grandfather   . Hypertension Maternal Grandfather        ?  Marland Kitchen Heart disease Paternal Grandmother   . Colon cancer Neg Hx   . Esophageal cancer Neg Hx   . Gallbladder disease Neg Hx    Social History   Socioeconomic History  . Marital status: Married    Spouse name: Not on file  . Number of children: 1  . Years of education: Not on file  . Highest education level: Not on file  Occupational History  . Occupation: Oncologist  . Occupation: Aeronautical engineer  Tobacco Use  . Smoking status: Never Smoker  . Smokeless tobacco: Never Used  . Tobacco comment: Lived with smokers x 23 yrs, smoked herself "for a week"  Vaping Use  . Vaping Use: Never used  Substance and Sexual Activity  . Alcohol use: No    Alcohol/week: 0.0 standard drinks  . Drug use: No  . Sexual activity: Not Currently  Other Topics Concern  . Not on file  Social History Narrative   Lives with husband Herbie [place]   1 adopted child   Social Determinants of Health   Financial Resource Strain: Low Risk   . Difficulty of Paying Living Expenses: Not hard at all  Food Insecurity: No Food Insecurity  . Worried About Charity fundraiser in the Last Year: Never true  . Ran Out of Food in the Last Year: Never true  Transportation Needs: No Transportation Needs  . Lack of Transportation (Medical): No  . Lack of Transportation (Non-Medical): No  Physical Activity: Insufficiently Active  . Days of Exercise per Week: 3 days  . Minutes of Exercise per Session: 30 min  Stress: Stress Concern Present  . Feeling of Stress : Rather much  Social Connections:   . Frequency of Communication with Friends and Family:   . Frequency of Social Gatherings with Friends and Family:   . Attends Religious Services:   . Active Member of Clubs or Organizations:   . Attends  Archivist Meetings:   Marland Kitchen Marital Status:     Tobacco Counseling Counseling given: Not Answered Comment: Lived with smokers x 23 yrs, smoked herself "for a week"   Clinical Intake:  Pre-visit preparation completed: Yes  Pain : 0-10 Pain Score: 5  Pain Type: Chronic pain Pain Location:  (all over body) Pain Descriptors / Indicators: Aching Pain Onset: More than a month ago Pain Frequency: Constant     Nutritional Risks: Nausea/ vomitting/ diarrhea (diarrhea) Diabetes: No  How often do you need to have someone help you when you read instructions, pamphlets, or other written materials from your doctor or pharmacy?: 1 - Never What is the last grade level you completed in school?: 12th, some college  Diabetic: No  Nutrition Risk Assessment:  Has the patient had any N/V/D within the last 2 months?  Yes  Does the patient have any non-healing wounds?  No  Has the patient had any unintentional weight loss or weight gain?  No   Diabetes:  Is the patient diabetic?  No  If diabetic, was a CBG obtained today?  No  Did the patient bring in their glucometer from home?  No  How often do you monitor your CBG's? N/A.   Financial Strains and Diabetes Management:  Are you having any financial strains with the device, your supplies or your medication? No .  Does the patient want to be seen by Chronic  Care Management for management of their diabetes?  No  Would the patient like to be referred to a Nutritionist or for Diabetic Management?  No     Interpreter Needed?: No  Information entered by :: CJohnson, LPN   Activities of Daily Living In your present state of health, do you have any difficulty performing the following activities: 12/01/2019  Hearing? N  Vision? N  Difficulty concentrating or making decisions? N  Walking or climbing stairs? N  Dressing or bathing? N  Doing errands, shopping? N  Preparing Food and eating ? N  Using the Toilet? N  In the past six  months, have you accidently leaked urine? N  Do you have problems with loss of bowel control? N  Managing your Medications? N  Managing your Finances? N  Housekeeping or managing your Housekeeping? N  Some recent data might be hidden    Patient Care Team: [name], MD as PCP - General Lovena Le Champ Mungo, MD as PCP - Electrophysiology (Cardiology) Minna Merritts, MD as PCP - Cardiology (Cardiology) [name], MD as Referring Physician (Family Medicine) Lafayette Dragon, MD (Inactive) as Consulting Physician (Gastroenterology) Evans Lance, MD as Consulting Physician (Cardiology) [name] Kathlene November, MD as Consulting Physician (Cardiology) [name], [facility] as Pharmacist (Pharmacist)  Indicate any recent Medical Services you may have received from other than [facility] providers in the past year (date may be approximate).     Assessment:   This is a routine wellness examination for [place].  Hearing/Vision screen  Hearing Screening   125Hz  250Hz  500Hz  1000Hz  2000Hz  3000Hz  4000Hz  6000Hz  8000Hz   Right ear:           Left ear:           Vision Screening Comments: Patient gets annual eye exams  Dietary issues and exercise activities discussed: Current Exercise Habits: Structured exercise class, Type of exercise: Other - see comments (cardiac rehab), Time (Minutes): 30, Frequency (Times/Week): 3, Weekly Exercise (Minutes/Week): 90, Intensity: Moderate, Exercise limited by: None identified  Goals    . Increase physical activity     Starting 05/23/16, I will continue to exercise at least 20 min twice weekly as tolerated.    . Patient Stated     Starting 11/09/2017, I will continue to take medications as prescribed.     . Patient Stated     12/01/2019, I will continue to do cardiac rehab 3 times a week.     Marland Kitchen Pharmacy Care Plan: Cholesterol     CARE PLAN ENTRY  Current Barriers:  . Uncontrolled hyperlipidemia . Current antihyperlipidemic regimen:   Red yeast rice -  1 capsule BID  Ezetimibe 10 mg - 1 tablet daily  . Previous antihyperlipidemic medications tried: atorvastatin, pravastatin, simvastatin, rosuvastatin . Most recent lipid panel:     Component Value Date/Time   CHOL 217 (H) 03/18/2019 0933   TRIG 199.0 (H) 03/18/2019 0933   HDL 46.60 03/18/2019 0933   CHOLHDL 5 03/18/2019 0933   VLDL 39.8 03/18/2019 0933   LDLCALC 131 (H) 03/18/2019 0933   LDLDIRECT 164.0 11/27/2018 1407 .  ASCVD risk enhancing conditions: age >19, CHF . 10-year ASCVD risk score: 14.1% (intermediate risk)  Pharmacist Clinical Goal(s):  Marland Kitchen Over the next 6 months, patient will work with PharmD and providers toward the following goals: o Reduce LDL (bad cholesterol) within goal of less than 100 o Improve HDL (good cholesterol) within goal of greater than 50 o Reduce triglycerides within goal of less than  150   Interventions: . Comprehensive medication review performed; medication list updated in electronic medical record.   Patient Self Care Activities:  . Patient will focus on medication adherence by using pillbox and taking medication daily . Focus on heart healthy diet high in fiber, whole grains, lean meats, fruits, vegetables, and fish   Initial goal documentation    . Pharmacy Care Plan: General     CARE PLAN ENTRY  Current Barriers:  . Chronic Disease Management support, education, and care coordination needs related to congestive heart failure  Pharmacist Clinical Goal(s):  Marland Kitchen Over the next 6 months, patient will work with PharmD and primary care provider to address the following goals: o Heart Failure: Prevent shortness of breath and fluid retention  Interventions: . Comprehensive medication review performed  Patient Self Care Activities:  For the next 6 months until follow up visit:  . Weigh daily first thing in the morning, if weight gain of 3 pounds overnight or 5 pounds in 1 week, take 1 torsemide daily until weight returned to  baseline  Initial goal documentation      Depression Screen PHQ 2/9 Scores 12/01/2019 12/01/2019 11/05/2019 10/01/2019 11/27/2018 11/09/2017 05/23/2016  PHQ - 2 Score 0 2 1 4 1 1  0  PHQ- 9 Score 0 10 9 11  - 7 -    Fall Risk Fall Risk  12/01/2019 09/30/2019 11/27/2018 11/09/2017 05/23/2016  Falls in the past year? 0 1 1 Yes Yes  Comment - - - - pt has had multiple accidental falls without injury  Number falls in past yr: 0 1 1 2  or more 2 or more  Comment - previous fall broken 6 ribs - - -  Injury with Fall? 0 0 0 Yes No  Risk Factor Category  - - - High Fall Risk -  Risk for fall due to : Medication side effect Impaired balance/gait;History of fall(s);Medication side effect - Impaired balance/gait Impaired vision;History of fall(s);Impaired balance/gait  Follow up Falls evaluation completed;Falls prevention discussed Falls prevention discussed - - -    Any stairs in or around the home? Yes  If so, are there any without handrails? No  Home free of loose throw rugs in walkways, pet beds, electrical cords, etc? Yes  Adequate lighting in your home to reduce risk of falls? Yes   ASSISTIVE DEVICES UTILIZED TO PREVENT FALLS:  Life alert? No  Use of a cane, walker or w/c? No  Grab bars in the bathroom? No  Shower chair or bench in shower? No  Elevated toilet seat or a handicapped toilet? No   TIMED UP AND GO:  Was the test performed? N/A, telephonic visit .    Cognitive Function: MMSE - Mini Mental State Exam 12/01/2019 11/09/2017 05/23/2016  Orientation to time 5 5 5   Orientation to Place 5 5 5   Registration 3 3 3   Attention/ Calculation 5 0 0  Recall 3 3 3   Language- name 2 objects - 0 0  Language- repeat 1 1 1   Language- follow 3 step command - 3 3  Language- read & follow direction - 0 0  Write a sentence - 0 0  Copy design - 0 0  Total score - 20 20  Mini Cog  Mini-Cog screen was completed. Maximum score is 22. A value of 0 denotes this part of the MMSE was not completed or the  patient failed this part of the Mini-Cog screening.       Immunizations Immunization History  Administered Date(s) Administered  .  Pneumococcal Conjugate-13 06/08/2015  . Pneumococcal Polysaccharide-23 02/16/2012  . Td 11/12/2005    TDAP status: Due, Education has been provided regarding the importance of this vaccine. Advised may receive this vaccine at local pharmacy or Health Dept. Aware to provide a copy of the vaccination record if obtained from local pharmacy or Health Dept. Verbalized acceptance and understanding. Flu Vaccine status: due Fall 2021 Pneumococcal vaccine status: Up to date Covid-19 vaccine status: Declined, Education has been provided regarding the importance of this vaccine but patient still declined. Advised may receive this vaccine at local pharmacy or Health Dept.or vaccine clinic. Aware to provide a copy of the vaccination record if obtained from local pharmacy or Health Dept. Verbalized acceptance and understanding.  Qualifies for Shingles Vaccine? Yes   Zostavax completed No   Shingrix Completed?: No.    Education has been provided regarding the importance of this vaccine. Patient has been advised to call insurance company to determine out of pocket expense if they have not yet received this vaccine. Advised may also receive vaccine at local pharmacy or Health Dept. Verbalized acceptance and understanding.  Screening Tests Health Maintenance  Topic Date Due  . DTAP VACCINES (1) 09/17/1945  . COVID-19 Vaccine (1) 12/17/2019 (Originally 07/18/1957)  . DTaP/Tdap/Td (2 - Tdap) 11/11/2025 (Originally 11/13/2015)  . TETANUS/TDAP  11/11/2025 (Originally 11/13/2015)  . INFLUENZA VACCINE  12/14/2019  . MAMMOGRAM  02/24/2020  . COLONOSCOPY  02/06/2028  . DEXA SCAN  Completed  . Hepatitis C Screening  Completed  . PNA vac Low Risk Adult  Completed    Health Maintenance  Health Maintenance Due  Topic Date Due  . DTAP VACCINES (1) 09/17/1945    Colorectal cancer  screening: Completed 02/05/2018. Repeat every 10 years Mammogram status: Completed 02/24/2019. Repeat every year Bone Density status: Completed 01/25/2018. Results reflect: Bone density results: OSTEOPOROSIS. Repeat every 2 years.  Lung Cancer Screening: (Low Dose CT Chest recommended if Age 21-80 years, 30 pack-year currently smoking OR have quit w/in 15years.) does not qualify.    Additional Screening:  Hepatitis C Screening: does qualify; Completed 11/16/2016  Vision Screening: Recommended annual ophthalmology exams for early detection of glaucoma and other disorders of the eye. Is the patient up to date with their annual eye exam?  Yes  Who is the provider or what is the name of the office in which the patient attends annual eye exams? [facility]  If pt is not established with a provider, would they like to be referred to a provider to establish care? No .   Dental Screening: Recommended annual dental exams for proper oral hygiene  Community Resource Referral / Chronic Care Management: CRR required this visit?  No   CCM required this visit?  No      Plan:     I have personally reviewed and noted the following in the patient's chart:   . Medical and social history . Use of alcohol, tobacco or illicit drugs  . Current medications and supplements . Functional ability and status . Nutritional status . Physical activity . Advanced directives . List of other physicians . Hospitalizations, surgeries, and ER visits in previous 12 months . Vitals . Screenings to include cognitive, depression, and falls . Referrals and appointments  In addition, I have reviewed and discussed with patient certain preventive protocols, quality metrics, and best practice recommendations. A written personalized care plan for preventive services as well as general preventive health recommendations were provided to patient.   Due to this  being a telephonic visit, the after visit summary with  patients personalized plan was offered to patient via mail or my-chart. Patient preferred to pick up at office at next visit.   Andrez Grime, LPN   3/46/[id number]

## 2019-12-03 ENCOUNTER — Other Ambulatory Visit: Payer: Self-pay

## 2019-12-03 ENCOUNTER — Encounter: Payer: Medicare Other | Admitting: *Deleted

## 2019-12-03 DIAGNOSIS — I5022 Chronic systolic (congestive) heart failure: Secondary | ICD-10-CM

## 2019-12-03 NOTE — Progress Notes (Signed)
Daily Session Note  Patient Details  Name: Ashley [name] MRN: [id number] Date of Birth: Jul 23, 1945 Referring Provider:     Cardiac Rehab from 10/01/2019 in [facility]  Referring Provider [name] MD      Encounter Date: 12/03/2019  Check In:  Session Check In - 12/03/19 1042      Check-In   Supervising physician immediately available to respond to emergencies See telemetry face sheet for immediately available ER MD    Location [facility]    Staff Present Renita Papa, RN Margurite Auerbach, MS Exercise Physiologist;Amanda Oletta Darter, BA, ACSM CEP, Exercise Physiologist    Virtual Visit No    Medication changes reported     No    Fall or balance concerns reported    No    Warm-up and Cool-down Performed on first and last piece of equipment    Resistance Training Performed Yes    VAD Patient? No    PAD/SET Patient? No      Pain Assessment   Currently in Pain? No/denies              Social History   Tobacco Use  Smoking Status Never Smoker  Smokeless Tobacco Never Used  Tobacco Comment   Lived with smokers x 23 yrs, smoked herself "for a week"    Goals Met:  Independence with exercise equipment Exercise tolerated well No report of cardiac concerns or symptoms Strength training completed today  Goals Unmet:  Not Applicable  Comments: Pt able to follow exercise prescription today without complaint.  Will continue to monitor for progression.    Dr. Emily Filbert is Medical Director for [facility].

## 2019-12-05 ENCOUNTER — Ambulatory Visit (INDEPENDENT_AMBULATORY_CARE_PROVIDER_SITE_OTHER): Payer: Medicare Other | Admitting: Family Medicine

## 2019-12-05 ENCOUNTER — Encounter: Payer: Self-pay | Admitting: Family Medicine

## 2019-12-05 ENCOUNTER — Other Ambulatory Visit: Payer: Self-pay

## 2019-12-05 VITALS — BP 114/70 | HR 91 | Temp 97.7°F | Ht 64.5 in | Wt 182.3 lb

## 2019-12-05 DIAGNOSIS — J418 Mixed simple and mucopurulent chronic bronchitis: Secondary | ICD-10-CM

## 2019-12-05 DIAGNOSIS — T887XXS Unspecified adverse effect of drug or medicament, sequela: Secondary | ICD-10-CM

## 2019-12-05 DIAGNOSIS — I420 Dilated cardiomyopathy: Secondary | ICD-10-CM

## 2019-12-05 DIAGNOSIS — D803 Selective deficiency of immunoglobulin G [IgG] subclasses: Secondary | ICD-10-CM

## 2019-12-05 DIAGNOSIS — Z7189 Other specified counseling: Secondary | ICD-10-CM

## 2019-12-05 DIAGNOSIS — M159 Polyosteoarthritis, unspecified: Secondary | ICD-10-CM

## 2019-12-05 DIAGNOSIS — I5022 Chronic systolic (congestive) heart failure: Secondary | ICD-10-CM

## 2019-12-05 DIAGNOSIS — F411 Generalized anxiety disorder: Secondary | ICD-10-CM | POA: Diagnosis not present

## 2019-12-05 DIAGNOSIS — R5382 Chronic fatigue, unspecified: Secondary | ICD-10-CM

## 2019-12-05 DIAGNOSIS — R7689 Other specified abnormal immunological findings in serum: Secondary | ICD-10-CM

## 2019-12-05 DIAGNOSIS — R1084 Generalized abdominal pain: Secondary | ICD-10-CM

## 2019-12-05 DIAGNOSIS — I700 Atherosclerosis of aorta: Secondary | ICD-10-CM

## 2019-12-05 DIAGNOSIS — E559 Vitamin D deficiency, unspecified: Secondary | ICD-10-CM

## 2019-12-05 DIAGNOSIS — R5381 Other malaise: Secondary | ICD-10-CM

## 2019-12-05 DIAGNOSIS — N281 Cyst of kidney, acquired: Secondary | ICD-10-CM

## 2019-12-05 DIAGNOSIS — T50A95S Adverse effect of other bacterial vaccines, sequela: Secondary | ICD-10-CM

## 2019-12-05 DIAGNOSIS — N1831 Chronic kidney disease, stage 3a: Secondary | ICD-10-CM

## 2019-12-05 DIAGNOSIS — Z95810 Presence of automatic (implantable) cardiac defibrillator: Secondary | ICD-10-CM

## 2019-12-05 DIAGNOSIS — M810 Age-related osteoporosis without current pathological fracture: Secondary | ICD-10-CM

## 2019-12-05 DIAGNOSIS — K582 Mixed irritable bowel syndrome: Secondary | ICD-10-CM | POA: Diagnosis not present

## 2019-12-05 DIAGNOSIS — M150 Primary generalized (osteo)arthritis: Secondary | ICD-10-CM

## 2019-12-05 DIAGNOSIS — M797 Fibromyalgia: Secondary | ICD-10-CM

## 2019-12-05 DIAGNOSIS — M8949 Other hypertrophic osteoarthropathy, multiple sites: Secondary | ICD-10-CM

## 2019-12-05 DIAGNOSIS — L304 Erythema intertrigo: Secondary | ICD-10-CM

## 2019-12-05 DIAGNOSIS — E782 Mixed hyperlipidemia: Secondary | ICD-10-CM

## 2019-12-05 DIAGNOSIS — G8929 Other chronic pain: Secondary | ICD-10-CM

## 2019-12-05 DIAGNOSIS — F331 Major depressive disorder, recurrent, moderate: Secondary | ICD-10-CM | POA: Diagnosis not present

## 2019-12-05 DIAGNOSIS — R768 Other specified abnormal immunological findings in serum: Secondary | ICD-10-CM

## 2019-12-05 MED ORDER — CLOTRIMAZOLE 1 % EX CREA: 1.0000 "application " | TOPICAL_CREAM | Freq: Two times a day (BID) | CUTANEOUS | 1 refills | Status: DC

## 2019-12-05 MED ORDER — LORAZEPAM 0.5 MG PO TABS: 0.2500 mg | ORAL_TABLET | Freq: Every evening | ORAL | 1 refills | Status: DC | PRN

## 2019-12-05 NOTE — Progress Notes (Signed)
This visit was conducted in person.  BP 114/70 (BP Location: Left Arm, Patient Position: Sitting, Cuff Size: Large)   Pulse 91   Temp 97.7 F (36.5 C) (Temporal)   Ht 5' 4.5" (1.638 m)   Wt 182 lb 5 oz (82.7 kg)   SpO2 96%   BMI 30.81 kg/m    CC: CPE Subjective:    Patient ID: Ashley [name], female    DOB: Jul 05, 1945, 74 y.o.   MRN: [id number]  HPI: Ashley [name] is a 74 y.o. female presenting on 12/05/2019 for Annual Exam (Prt 2. ) and Cough (Cough worse in last 3 wks. )   Saw health advisor earlier this week for medicare wellness visit. Note reviewed.   No exam data present    Clinical Support from 12/01/2019 in [facility] [place]  PHQ-2 Total Score 0      Fall Risk  12/01/2019 09/30/2019 11/27/2018 11/09/2017 05/23/2016  Falls in the past year? 0 1 1 Yes Yes  Comment - - - - pt has had multiple accidental falls without injury  Number falls in past yr: 0 1 1 2  or more 2 or more  Comment - previous fall broken 6 ribs - - -  Injury with Fall? 0 0 0 Yes No  Risk Factor Category  - - - High Fall Risk -  Risk for fall due to : Medication side effect Impaired balance/gait;History of fall(s);Medication side effect - Impaired balance/gait Impaired vision;History of fall(s);Impaired balance/gait  Follow up Falls evaluation completed;Falls prevention discussed Falls prevention discussed - - -    Established with pulm Ashley [name]) for asthma with reassuring PFTs.  Has seen cardiology Dr Ashley [name] - stable period for chronic sCHF, on low dose entresto, now only taking torsemide 10mg  2-3 times a week with potassium as well as spironolactone.  Has been going to cardiac rehab.   New yeast rash to groin despite nystatin and antifungal powder regular use. Chronic issue since 2014.  Wants to start using dark cherries and turmeric for inflammation.   Preventative: COLONOSCOPY 01/2018 - 1 small benign polyp, diverticulosis, healthy anastomosis Ashley [name] @ [facility])  Well woman  examwith Ashley [name] [place] 02/2019 - s/p abd hysterectomy with BSO. Breast cancer screening - Birads2 02/2019  Lung cancer screening - not indicated  DEXAthrough Ashley [name] - T-2.7 spine (01/2016) - on fosamax for at least 10 yrs along with calcium + vitamin D. Fosamax holiday 2019 planned rpt DEXA this year and raessessment.  Flu shot - declines - egg allergy Td 2007  Pneumovax 2013, prevnar2017 - bad reaction to this shingrix-discussed,declines  COVID vaccine - discussed, declines  Advanced directive discussion - has at home. Husband is HCPOA.Asked to bring [place] a copy.  Seat belt use discussed  Sunscreen use discussed. No changing moles on skin. Sees derm regularly. Non smoker  Alcohol - none  Dentist - overdue  Eye exam - yearly  Bowel - alternates constipation / diarrhea  Bladder - mild urge incontinence when she first sits on commode  Lives with husband, Ashley [name]. 1 adopted child  Activity:no regular exercise - limited by pain  Diet: seldom water, good fruits/vegetables      Relevant past medical, surgical, family and social history reviewed and updated as indicated. Interim medical history since our last visit reviewed. Allergies and medications reviewed and updated. Outpatient Medications Prior to Visit  Medication Sig Dispense Refill  . acetaminophen (TYLENOL) 500 MG tablet Take 500 mg by mouth daily as needed for  moderate pain or headache (PAIN).     . AMBULATORY NON FORMULARY MEDICATION Medication Name:  1 squatty potty to use as needed 1 each 0  . Ascorbic Acid (VITAMIN C) 1000 MG tablet Take 1,000 mg by mouth See admin instructions. Take 1000 mg daily, may take an additional 1000 mg as needed for cold symptoms    . aspirin EC 81 MG tablet Take 81 mg by mouth at bedtime.     . benzonatate (TESSALON) 200 MG capsule Take 1 capsule (200 mg total) by mouth 3 (three) times daily as needed for cough. 30 capsule 3  . carvedilol (COREG) 6.25 MG tablet Take 0.5  tablets (3.125 mg total) by mouth 2 (two) times daily with a meal.    . cholecalciferol (VITAMIN D) 1000 units tablet Take 1,000 Units by mouth daily.     . Coenzyme Q10 (COQ-10) 100 MG CAPS Take 100 mg by mouth daily.    Marland Kitchen dextromethorphan (DELSYM) 30 MG/5ML liquid Take 15 mg by mouth daily as needed for cough.     . diclofenac Sodium (VOLTAREN) 1 % GEL Apply 1 application topically 4 (four) times daily.    Marland Kitchen ezetimibe (ZETIA) 10 MG tablet Take 1 tablet (10 mg total) by mouth daily. 90 tablet 3  . Fluticasone Furoate (ARNUITY ELLIPTA) 100 MCG/ACT AEPB Inhale 1 puff into the lungs daily. 30 each 11  . GARLIC PO Take 1 capsule by mouth daily.    Marland Kitchen guaiFENesin (MUCINEX) 600 MG 12 hr tablet Take 600 mg by mouth daily.    Marland Kitchen levalbuterol (XOPENEX HFA) 45 MCG/ACT inhaler Inhale 2 puffs into the lungs every 4 (four) hours as needed for wheezing or shortness of breath. (Patient taking differently: Inhale 2 puffs into the lungs every 4 (four) hours as needed for wheezing or shortness of breath. ) 1 Inhaler 3  . Menthol, Topical Analgesic, (BIOFREEZE ROLL-ON EX) Apply topically.    . miconazole (MICOTIN) 2 % powder Apply 1 application topically as needed (yeast).    . nystatin (MYCOSTATIN) 100000 UNIT/ML suspension Take 5 mLs (500,000 Units total) by mouth 3 (three) times daily. 120 mL 0  . nystatin cream (MYCOSTATIN) Apply 1 application topically as needed for dry skin (yeast infection). 30 g 3  . potassium chloride (KLOR-CON) 10 MEQ tablet Take 1 tablet (10 mEq total) by mouth daily as needed (with laxis). 90 tablet 3  . pyridOXINE (VITAMIN B-6) 100 MG tablet Take 100 mg by mouth daily.    . Red Yeast Rice Extract (RED YEAST RICE PO) Take 1 capsule by mouth in the morning and at bedtime.     . sacubitril-valsartan (ENTRESTO) 24-26 MG Take 1 tablet by mouth 2 (two) times daily. 180 tablet 3  . spironolactone (ALDACTONE) 25 MG tablet Take 0.5 tablets (12.5 mg total) by mouth daily for 30 days. (Patient  taking differently: Take 25 mg by mouth daily. 1 tablet every other day) 15 tablet 0  . triamcinolone (NASACORT) 55 MCG/ACT AERO nasal inhaler Place 2 sprays into the nose daily.    . vitamin E 400 UNIT capsule Take 400 Units by mouth daily.    . carvedilol (COREG) 3.125 MG tablet Take 1 tablet (3.125 mg total) by mouth 2 (two) times daily with a meal. (Patient taking differently: Take 3.125 mg by mouth 2 (two) times daily with a meal. 1/2 tablet BID) 180 tablet 3  . carvedilol (COREG) 6.25 MG tablet TAKE 1 TABLET (6.25 MG TOTAL) BY MOUTH 2 (TWO) TIMES  DAILY WITH A MEAL. 180 tablet 1  . LORazepam (ATIVAN) 0.5 MG tablet Take 0.5-1 tablets (0.25-0.5 mg total) by mouth at bedtime as needed for anxiety or sleep. 30 tablet 1  . torsemide (DEMADEX) 10 MG tablet Take 1 tablet (10 mg total) by mouth every other day. 45 tablet 3   No facility-administered medications prior to visit.     Per HPI unless specifically indicated in ROS section below Review of Systems Objective:  BP 114/70 (BP Location: Left Arm, Patient Position: Sitting, Cuff Size: Large)   Pulse 91   Temp 97.7 F (36.5 C) (Temporal)   Ht 5' 4.5" (1.638 m)   Wt 182 lb 5 oz (82.7 kg)   SpO2 96%   BMI 30.81 kg/m   Wt Readings from Last 3 Encounters:  12/05/19 182 lb 5 oz (82.7 kg)  10/09/19 179 lb (81.2 kg)  10/06/19 178 lb 2 oz (80.8 kg)      Physical Exam Vitals and nursing note reviewed.  Constitutional:      General: She is not in acute distress.    Appearance: Normal appearance. She is well-developed. She is not ill-appearing.  HENT:     Head: Normocephalic and atraumatic.     Right Ear: Hearing, tympanic membrane, ear canal and external ear normal.     Left Ear: Hearing, tympanic membrane, ear canal and external ear normal.  Eyes:     General: No scleral icterus.    Extraocular Movements: Extraocular movements intact.     Conjunctiva/sclera: Conjunctivae normal.     Pupils: Pupils are equal, round, and reactive to  light.  Neck:     Thyroid: No thyroid mass, thyromegaly or thyroid tenderness.     Vascular: No carotid bruit.  Cardiovascular:     Rate and Rhythm: Normal rate and regular rhythm.     Pulses: Normal pulses.          Radial pulses are 2+ on the right side and 2+ on the left side.     Heart sounds: Normal heart sounds. No murmur heard.   Pulmonary:     Effort: Pulmonary effort is normal. No respiratory distress.     Breath sounds: Normal breath sounds. No wheezing, rhonchi or rales.  Abdominal:     General: Abdomen is flat. Bowel sounds are normal. There is distension.     Palpations: Abdomen is soft. There is no mass.     Tenderness: There is generalized abdominal tenderness. There is no right CVA tenderness, left CVA tenderness, guarding or rebound. Negative signs include Murphy's sign.     Hernia: No hernia is present.  Musculoskeletal:        General: Normal range of motion.     Cervical back: Normal range of motion and neck supple.     Right lower leg: No edema.     Left lower leg: No edema.  Lymphadenopathy:     Cervical: No cervical adenopathy.  Skin:    General: Skin is warm and dry.     Findings: Rash present.     Comments: Erythematous plaques to inguinal region  Neurological:     General: No focal deficit present.     Mental Status: She is alert and oriented to person, place, and time.     Comments: CN grossly intact, station and gait intact  Psychiatric:        Mood and Affect: Mood normal.        Behavior: Behavior normal.  Thought Content: Thought content normal.        Judgment: Judgment normal.       Results for orders placed or performed in visit on 12/01/19  Comprehensive metabolic panel  Result Value Ref Range   Sodium 141 135 - 145 mEq/L   Potassium 4.0 3.5 - 5.1 mEq/L   Chloride 105 96 - 112 mEq/L   CO2 29 19 - 32 mEq/L   Glucose, Bld 96 70 - 99 mg/dL   BUN 19 6 - 23 mg/dL   Creatinine, Ser 0.88 0.40 - 1.20 mg/dL   Total Bilirubin 0.3 0.2 -  1.2 mg/dL   Alkaline Phosphatase 95 39 - 117 U/L   AST 25 0 - 37 U/L   ALT 26 0 - 35 U/L   Total Protein 7.6 6.0 - 8.3 g/dL   Albumin 4.2 3.5 - 5.2 g/dL   GFR 62.75 >60.00 mL/min   Calcium 9.8 8.4 - 10.5 mg/dL  VITAMIN D 25 Hydroxy (Vit-D Deficiency, Fractures)  Result Value Ref Range   VITD 33.99 30.00 - 100.00 ng/mL  CBC with Differential/Platelet  Result Value Ref Range   WBC 8.5 4.0 - 10.5 K/uL   RBC 4.24 3.87 - 5.11 Mil/uL   Hemoglobin 12.8 12.0 - 15.0 g/dL   HCT 38.3 36 - 46 %   MCV 90.5 78.0 - 100.0 fl   MCHC 33.3 30.0 - 36.0 g/dL   RDW 12.7 11.5 - 15.5 %   Platelets 218.0 150 - 400 K/uL   Neutrophils Relative % 60.1 43 - 77 %   Lymphocytes Relative 28.5 12 - 46 %   Monocytes Relative 9.1 3 - 12 %   Eosinophils Relative 1.4 0 - 5 %   Basophils Relative 0.9 0 - 3 %   Neutro Abs 5.1 1.4 - 7.7 K/uL   Lymphs Abs 2.4 0.7 - 4.0 K/uL   Monocytes Absolute 0.8 0 - 1 K/uL   Eosinophils Absolute 0.1 0 - 0 K/uL   Basophils Absolute 0.1 0 - 0 K/uL  Hemoglobin A1c  Result Value Ref Range   Hgb A1c MFr Bld 5.9 4.6 - 6.5 %  Lipid panel  Result Value Ref Range   Cholesterol 232 (H) 0 - 200 mg/dL   Triglycerides 172.0 (H) 0 - 149 mg/dL   HDL 47.70 >39.00 mg/dL   VLDL 34.4 0.0 - 40.0 mg/dL   LDL Cholesterol 149 (H) 0 - 99 mg/dL   Total CHOL/HDL Ratio 5    NonHDL 183.88    *Note: Due to a large number of results and/or encounters for the requested time period, some results have not been displayed. A complete set of results can be found in Results Review.   Depression screen [facility] 2/9 12/01/2019 12/01/2019 11/05/2019 10/01/2019 11/27/2018  Decreased Interest 0 1 0 2 0  Down, Depressed, Hopeless 0 1 1 2 1   PHQ - 2 Score 0 2 1 4 1   Altered sleeping 0 3 3 3  -  Tired, decreased energy 0 3 3 3  -  Change in appetite 0 1 1 0 -  Feeling bad or failure about yourself  0 0 0 0 -  Trouble concentrating 0 1 1 1  -  Moving slowly or fidgety/restless 0 0 0 0 -  Suicidal thoughts 0 0 0 0 -  PHQ-9  Score 0 10 9 11  -  Difficult doing work/chores Not difficult at all Somewhat difficult Somewhat difficult Very difficult -  Some recent data might be hidden  No flowsheet data found.  Assessment & Plan:  This visit occurred during the SARS-CoV-2 public health emergency.  Safety protocols were in place, including screening questions prior to the visit, additional usage of staff PPE, and extensive cleaning of exam room while observing appropriate contact time as indicated for disinfecting solutions.   Problem List Items Addressed This Visit    Vitamin D deficiency    Normal levels with ongoing replacement.      Osteoporosis    Planning rpt dexa this year through [name].       Osteoarthritis    Discussed turmeric use.       Mixed hyperlipidemia    Chronic, continues RYR and zetia in statin intolerance.  The 10-year ASCVD risk score Mikey Bussing DC Brooke Bonito., et al., 2013) is: 15.7%   Values used to calculate the score:     Age: 70 years     Sex: Female     Is Non-Hispanic African American: No     Diabetic: No     Tobacco smoker: No     Systolic Blood Pressure: 962 mmHg     Is BP treated: Yes     HDL Cholesterol: 47.7 mg/dL     Total Cholesterol: 232 mg/dL       Relevant Medications   carvedilol (COREG) 6.25 MG tablet   MDD (major depressive disorder), recurrent episode, moderate (HCC)    Remains off medication.       Relevant Medications   LORazepam (ATIVAN) 0.5 MG tablet   Irritable bowel syndrome   Intertrigo    Persists despite nystatin use. Will Rx clotrimazole cream. Pt prefers to avoid oral antifungal.      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 Latest GFR 62.       Chronic systolic CHF (congestive heart failure) (HCC)    Chronic, stable. Appreciate cards care. On low dose entresto (trouble tolerating higher dose) and torsemide.       Relevant Medications   carvedilol (COREG) 6.25 MG tablet   Chronic generalized abdominal pain   Chronic fatigue and malaise   Chronic bronchitis (HCC)    Established with pulm - respiratory issues thought likely stemming from asthma and cardiac history.       Cardiomyopathy, dilated, nonischemic (HCC)   Relevant Medications   carvedilol (COREG) 6.25 MG tablet   Biventricular implantable cardioverter-defibrillator (ICD) in [name]   Anxiety state    Continues PRN lorazepam with benefit.       Relevant Medications   LORazepam (ATIVAN) 0.5 MG tablet   Advanced care planning/counseling discussion - Primary    Advanced directive discussion - has at home. Husband is HCPOA.Asked to bring [place] a copy.       Acquired renal cyst of right kidney   Abdominal aortic atherosclerosis (HCC)    Continue aspirin, zetia, RYR. Unable to tolerate statin.       Relevant Medications   carvedilol (COREG) 6.25 MG tablet       Meds ordered this encounter  Medications  . clotrimazole (LOTRIMIN) 1 % cream    Sig:  Apply 1 application topically 2 (two) times daily.    Dispense:  60 g    Refill:  1  . LORazepam (ATIVAN) 0.5 MG tablet    Sig: Take 0.5-1 tablets (0.25-0.5 mg total) by mouth at bedtime as needed for anxiety or sleep.    Dispense:  30 tablet    Refill:  1   No orders of the defined types were placed in this encounter.   Patient instructions: Good to see you today Try clotrimazole cream to groin area.  Take extra torsemide this week for weight gain and leg swelling noted.  Ok to try turmeric for inflammation.  Return as needed or in 6 months for follow up visit.   Follow up plan: Return in about 6 months (around 06/06/2020) for follow up visit.  [name], MD

## 2019-12-05 NOTE — Patient Instructions (Addendum)
Good to see you today Try clotrimazole cream to groin area.  Take extra torsemide this week for weight gain and leg swelling noted.  Ok to try turmeric for inflammation.  Return as needed or in 6 months for follow up visit.   Health Maintenance After Age 74 After age 57, you are at a higher risk for certain long-term diseases and infections as well as injuries from falls. Falls are a major cause of broken bones and head injuries in people who are older than age 74. Getting regular preventive care can help to keep you healthy and well. Preventive care includes getting regular testing and making lifestyle changes as recommended by your health care provider. Talk with your health care provider about:  Which screenings and tests you should have. A screening is a test that checks for a disease when you have no symptoms.  A diet and exercise plan that is right for you. What should I know about screenings and tests to prevent falls? Screening and testing are the best ways to find a health problem early. Early diagnosis and treatment give you the best chance of managing medical conditions that are common after age 39. Certain conditions and lifestyle choices may make you more likely to have a fall. Your health care provider may recommend:  Regular vision checks. Poor vision and conditions such as cataracts can make you more likely to have a fall. If you wear glasses, make sure to get your prescription updated if your vision changes.  Medicine review. Work with your health care provider to regularly review all of the medicines you are taking, including over-the-counter medicines. Ask your health care provider about any side effects that may make you more likely to have a fall. Tell your health care provider if any medicines that you take make you feel dizzy or sleepy.  Osteoporosis screening. Osteoporosis is a condition that causes the bones to get weaker. This can make the bones weak and cause them to  break more easily.  Blood pressure screening. Blood pressure changes and medicines to control blood pressure can make you feel dizzy.  Strength and balance checks. Your health care provider may recommend certain tests to check your strength and balance while standing, walking, or changing positions.  Foot health exam. Foot pain and numbness, as well as not wearing proper footwear, can make you more likely to have a fall.  Depression screening. You may be more likely to have a fall if you have a fear of falling, feel emotionally low, or feel unable to do activities that you used to do.  Alcohol use screening. Using too much alcohol can affect your balance and may make you more likely to have a fall. What actions can I take to lower my risk of falls? General instructions  Talk with your health care provider about your risks for falling. Tell your health care provider if: ? You fall. Be sure to tell your health care provider about all falls, even ones that seem minor. ? You feel dizzy, sleepy, or off-balance.  Take over-the-counter and prescription medicines only as told by your health care provider. These include any supplements.  Eat a healthy diet and maintain a healthy weight. A healthy diet includes low-fat dairy products, low-fat (lean) meats, and fiber from whole grains, beans, and lots of fruits and vegetables. Home safety  Remove any tripping hazards, such as rugs, cords, and clutter.  Install safety equipment such as grab bars in bathrooms and safety rails on  stairs.  Keep rooms and walkways well-lit. Activity   Follow a regular exercise program to stay fit. This will help you maintain your balance. Ask your health care provider what types of exercise are appropriate for you.  If you need a cane or walker, use it as recommended by your health care provider.  Wear supportive shoes that have nonskid soles. Lifestyle  Do not drink alcohol if your health care provider tells  you not to drink.  If you drink alcohol, limit how much you have: ? 0-1 drink a day for women. ? 0-2 drinks a day for men.  Be aware of how much alcohol is in your drink. In the [place] one drink equals one typical bottle of beer (12 oz), one-half glass of wine (5 oz), or one shot of hard liquor (1 oz).  Do not use any products that contain nicotine or tobacco, such as cigarettes and e-cigarettes. If you need help quitting, ask your health care provider. Summary  Having a healthy lifestyle and getting preventive care can help to protect your health and wellness after age 53.  Screening and testing are the best way to find a health problem early and help you avoid having a fall. Early diagnosis and treatment give you the best chance for managing medical conditions that are more common for people who are older than age 61.  Falls are a major cause of broken bones and head injuries in people who are older than age 53. Take precautions to prevent a fall at home.  Work with your health care provider to learn what changes you can make to improve your health and wellness and to prevent falls. This information is not intended to replace advice given to you by your health care provider. Make sure you discuss any questions you have with your health care provider. Document Revised: 08/22/2018 Document Reviewed: 03/14/2017 Elsevier Patient Education  2020 [employer].

## 2019-12-08 ENCOUNTER — Encounter: Payer: Medicare Other | Admitting: *Deleted

## 2019-12-08 ENCOUNTER — Other Ambulatory Visit: Payer: Self-pay

## 2019-12-08 DIAGNOSIS — I5022 Chronic systolic (congestive) heart failure: Secondary | ICD-10-CM

## 2019-12-08 NOTE — Assessment & Plan Note (Signed)
Remains off medication.

## 2019-12-08 NOTE — Assessment & Plan Note (Signed)
Normal levels with ongoing replacement.

## 2019-12-08 NOTE — Assessment & Plan Note (Signed)
Discussed turmeric use.

## 2019-12-08 NOTE — Assessment & Plan Note (Signed)
Advanced directive discussion - has at home. Husband is HCPOA.Asked to bring [place] a copy.

## 2019-12-08 NOTE — Assessment & Plan Note (Signed)
Established with pulm - respiratory issues thought likely stemming from asthma and cardiac history.

## 2019-12-08 NOTE — Assessment & Plan Note (Signed)
Actually improvement noted with less torsemide use. Latest GFR 62.

## 2019-12-08 NOTE — Assessment & Plan Note (Signed)
Continue aspirin, zetia, RYR. Unable to tolerate statin.

## 2019-12-08 NOTE — Assessment & Plan Note (Addendum)
Persists despite nystatin use. Will Rx clotrimazole cream. Pt prefers to avoid oral antifungal.

## 2019-12-08 NOTE — Assessment & Plan Note (Addendum)
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

## 2019-12-08 NOTE — Progress Notes (Signed)
Daily Session Note  Patient Details  Name: Ashley [name] MRN: [id number] Date of Birth: [birth year] Referring Provider:     Cardiac Rehab from 10/01/2019 in [facility] Cardiac and Pulmonary Rehab  Referring Provider [name] MD      Encounter Date: 12/08/2019  Check In:  Session Check In - 12/08/19 1113      Check-In   Supervising physician immediately available to respond to emergencies See telemetry face sheet for immediately available ER MD    Location [facility]-Cardiac & Pulmonary Rehab    Staff Present Renita Papa, RN BSN;Joseph [address] [place], [place], ACSM CEP, Exercise Physiologist;Amanda Oletta Darter, [place], ACSM CEP, Exercise Physiologist    Virtual Visit No    Medication changes reported     No    Fall or balance concerns reported    No    Warm-up and Cool-down Performed on first and last piece of equipment    Resistance Training Performed Yes    VAD Patient? No    PAD/SET Patient? No      Pain Assessment   Currently in Pain? No/denies              Social History   Tobacco Use  Smoking Status Never Smoker  Smokeless Tobacco Never Used  Tobacco Comment   Lived with smokers x 23 yrs, smoked herself "for a week"    Goals Met:  Independence with exercise equipment Exercise tolerated well No report of cardiac concerns or symptoms Strength training completed today  Goals Unmet:  Not Applicable  Comments: Pt able to follow exercise prescription today without complaint.  Will continue to monitor for progression.    Dr. Emily Filbert is Medical Director for [place] and [facility].

## 2019-12-08 NOTE — Assessment & Plan Note (Signed)
Continues PRN lorazepam with benefit.

## 2019-12-08 NOTE — Assessment & Plan Note (Signed)
Chronic, stable. Appreciate cards care. On low dose entresto (trouble tolerating higher dose) and torsemide.

## 2019-12-08 NOTE — Assessment & Plan Note (Signed)
Chronic, continues RYR and zetia in statin intolerance.  The 10-year ASCVD risk score Mikey Bussing DC Brooke Bonito., et al., 2013) is: 15.7%   Values used to calculate the score:     Age: 74 years     Sex: Female     Is Non-Hispanic African American: No     Diabetic: No     Tobacco smoker: No     Systolic Blood Pressure: 445 mmHg     Is BP treated: Yes     HDL Cholesterol: 47.7 mg/dL     Total Cholesterol: 232 mg/dL

## 2019-12-08 NOTE — Assessment & Plan Note (Signed)
Planning to avoid COVID vaccine.

## 2019-12-08 NOTE — Assessment & Plan Note (Signed)
Seeing [facility] yearly now.

## 2019-12-08 NOTE — Assessment & Plan Note (Signed)
Planning rpt dexa this year through OBGYN.

## 2019-12-10 ENCOUNTER — Encounter: Payer: Medicare Other | Admitting: *Deleted

## 2019-12-10 ENCOUNTER — Other Ambulatory Visit: Payer: Self-pay

## 2019-12-10 DIAGNOSIS — I5022 Chronic systolic (congestive) heart failure: Secondary | ICD-10-CM

## 2019-12-10 NOTE — Progress Notes (Signed)
Daily Session Note  Patient Details  Name: Ashley [name] MRN: [id number] Date of Birth: 04-14-1946 Referring Provider:     Cardiac Rehab from 10/01/2019 in [facility] Cardiac and Pulmonary Rehab  Referring Provider [name] MD      Encounter Date: 12/10/2019  Check In:  Session Check In - 12/10/19 1026      Check-In   Supervising physician immediately available to respond to emergencies See telemetry face sheet for immediately available ER MD    Location [facility]-Cardiac & Pulmonary Rehab    Staff Present Renita Papa, RN BSN;Jessica [place]era, MA, RCEP, CCRP, CCET;Melissa [place] RDN, Rowe Pavy, [place], ACSM CEP, Exercise Physiologist    Virtual Visit No    Medication changes reported     No    Fall or balance concerns reported    No    Warm-up and Cool-down Performed on first and last piece of equipment    Resistance Training Performed Yes    VAD Patient? No    PAD/SET Patient? No      Pain Assessment   Currently in Pain? No/denies              Social History   Tobacco Use  Smoking Status Never Smoker  Smokeless Tobacco Never Used  Tobacco Comment   Lived with smokers x 23 yrs, smoked herself "for a week"    Goals Met:  Independence with exercise equipment Exercise tolerated well No report of cardiac concerns or symptoms Strength training completed today  Goals Unmet:  Not Applicable  Comments: Pt able to follow exercise prescription today without complaint.  Will continue to monitor for progression.  Reviewed home exercise with pt today.  Pt plans to walk and try staff videos for exercise.  Reviewed THR, pulse, RPE, sign and symptoms, pulse oximetery and when to call 911 or MD.  Also discussed weather considerations and indoor options.  Pt voiced understanding.   Dr. Emily Filbert is Medical Director for [place] and [facility].

## 2019-12-11 ENCOUNTER — Telehealth: Payer: Self-pay | Admitting: Cardiovascular Disease

## 2019-12-11 NOTE — Telephone Encounter (Signed)
Patient was told previously not to take vaccine as her heart is not strong.   Patient getting negative feedback from family and feels like she is getting URI  She is concerned she may suffer decreased EF due to illness and wants to know if Gollan changed his mind on vaccine.

## 2019-12-11 NOTE — Telephone Encounter (Signed)
Spoke with patient and she states she is not feeling well. Reporting weakness with cold symptoms. She does not want to get vaccine at this time. She continued to discuss how previous vaccines she has reacted to. She then wanted to know if she could get the IV infusion before doing the vaccine. Advised that I was not sure of that infusion and that we do not manage or order those services. She then continued to discuss vaccine and her concerns. She is going to hold off for now.

## 2019-12-15 ENCOUNTER — Other Ambulatory Visit: Payer: Self-pay

## 2019-12-15 ENCOUNTER — Encounter: Payer: Medicare Other | Attending: Cardiovascular Disease | Admitting: *Deleted

## 2019-12-15 DIAGNOSIS — I5022 Chronic systolic (congestive) heart failure: Secondary | ICD-10-CM | POA: Diagnosis not present

## 2019-12-15 NOTE — Progress Notes (Signed)
Daily Session Note  Patient Details  Name: Ashley [name] MRN: [id number] Date of Birth: [birth year] Referring Provider:     Cardiac Rehab from 10/01/2019 in [facility] Cardiac and Pulmonary Rehab  Referring Provider [name] MD      Encounter Date: 12/15/2019  Check In:  Session Check In - 12/15/19 1111      Check-In   Supervising physician immediately available to respond to emergencies See telemetry face sheet for immediately available ER MD    Location [facility]-Cardiac & Pulmonary Rehab    Staff Present Renita Papa, RN BSN;Joseph Lou Miner, [place] Exercise Physiologist;Kelly Amedeo Plenty, [place], ACSM CEP, Exercise Physiologist    Virtual Visit No    Medication changes reported     No    Fall or balance concerns reported    No    Warm-up and Cool-down Performed on first and last piece of equipment    Resistance Training Performed Yes    VAD Patient? No    PAD/SET Patient? No      Pain Assessment   Currently in Pain? No/denies              Social History   Tobacco Use  Smoking Status Never Smoker  Smokeless Tobacco Never Used  Tobacco Comment   Lived with smokers x 23 yrs, smoked herself "for a week"    Goals Met:  Independence with exercise equipment Exercise tolerated well No report of cardiac concerns or symptoms Strength training completed today  Goals Unmet:  Not Applicable  Comments: Pt able to follow exercise prescription today without complaint.  Will continue to monitor for progression.    Dr. Emily Filbert is Medical Director for [place] and [facility].

## 2019-12-17 ENCOUNTER — Encounter: Payer: Medicare Other | Admitting: *Deleted

## 2019-12-17 DIAGNOSIS — I5022 Chronic systolic (congestive) heart failure: Secondary | ICD-10-CM

## 2019-12-17 NOTE — Progress Notes (Signed)
Daily Session Note  Patient Details  Name: Ashley [name] MRN: [id number] Date of Birth: September 02, 1945 Referring Provider:     Cardiac Rehab from 10/01/2019 in [facility] Cardiac and Pulmonary Rehab  Referring Provider [name] MD      Encounter Date: 12/17/2019  Check In:  Session Check In - 12/17/19 1150      Check-In   Supervising physician immediately available to respond to emergencies See telemetry face sheet for immediately available ER MD    Location [facility]-Cardiac & Pulmonary Rehab    Staff Present Renita Papa, RN BSN;Joseph Hood RCP,RRT,BSRT;Heath Lark, RN, BSN, CCRP;Melissa [place] RDN, Rowe Pavy, BA, ACSM CEP, Exercise Physiologist    Virtual Visit No    Medication changes reported     No    Fall or balance concerns reported    No    Warm-up and Cool-down Performed on first and last piece of equipment    Resistance Training Performed Yes    VAD Patient? No    PAD/SET Patient? No      Pain Assessment   Currently in Pain? No/denies              Social History   Tobacco Use  Smoking Status Never Smoker  Smokeless Tobacco Never Used  Tobacco Comment   Lived with smokers x 23 yrs, smoked herself "for a week"    Goals Met:  Independence with exercise equipment Exercise tolerated well No report of cardiac concerns or symptoms Strength training completed today  Goals Unmet:  Not Applicable  Comments: Pt able to follow exercise prescription today without complaint.  Will continue to monitor for progression.    Dr. Emily Filbert is Medical Director for [place] and [facility].

## 2019-12-22 ENCOUNTER — Encounter: Payer: Medicare Other | Admitting: *Deleted

## 2019-12-22 ENCOUNTER — Other Ambulatory Visit: Payer: Self-pay

## 2019-12-22 DIAGNOSIS — I5022 Chronic systolic (congestive) heart failure: Secondary | ICD-10-CM

## 2019-12-22 NOTE — Progress Notes (Signed)
Daily Session Note  Patient Details  Name: MAILEN NEWBORN MRN: [id number] Date of Birth: Nov 26, 1945 Referring Provider:     Cardiac Rehab from 10/01/2019 in [facility] Cardiac and Pulmonary Rehab  Referring Provider [name] MD      Encounter Date: 12/22/2019  Check In:  Session Check In - 12/22/19 1118      Check-In   Supervising physician immediately available to respond to emergencies See telemetry face sheet for immediately available ER MD    Location [facility]-Cardiac & Pulmonary Rehab    Staff Present Renita Papa, RN BSN;Joseph Lou Miner, [place] Exercise Physiologist;Kelly Amedeo Plenty, [place], ACSM CEP, Exercise Physiologist    Virtual Visit No    Medication changes reported     No    Fall or balance concerns reported    No    Warm-up and Cool-down Performed on first and last piece of equipment    Resistance Training Performed Yes    VAD Patient? No    PAD/SET Patient? No      Pain Assessment   Currently in Pain? No/denies              Social History   Tobacco Use  Smoking Status Never Smoker  Smokeless Tobacco Never Used  Tobacco Comment   Lived with smokers x 23 yrs, smoked herself "for a week"    Goals Met:  Independence with exercise equipment Exercise tolerated well No report of cardiac concerns or symptoms Strength training completed today  Goals Unmet:  Not Applicable  Comments: Pt able to follow exercise prescription today without complaint.  Will continue to monitor for progression.    Dr. Emily Filbert is Medical Director for [place] and [facility].

## 2019-12-24 ENCOUNTER — Encounter: Payer: Self-pay | Admitting: *Deleted

## 2019-12-24 DIAGNOSIS — I5022 Chronic systolic (congestive) heart failure: Secondary | ICD-10-CM

## 2019-12-24 NOTE — Progress Notes (Signed)
Cardiac Individual Treatment Plan  Patient Details  Name: Ashley [name] MRN: [id number] Date of Birth: [birth year] Referring Provider:     Cardiac Rehab from 10/01/2019 in [facility] Cardiac and Pulmonary Rehab  Referring Provider Julien Nordmann MD      Initial Encounter Date:    Cardiac Rehab from 10/01/2019 in [facility] Cardiac and Pulmonary Rehab  Date 10/01/19      Visit Diagnosis: Heart failure, chronic systolic (HCC)  Patient's Home Medications on Admission:  Current Outpatient Medications:  .  acetaminophen (TYLENOL) 500 MG tablet, Take 500 mg by mouth daily as needed for moderate pain or headache (PAIN). , Disp: , Rfl:  .  AMBULATORY NON FORMULARY MEDICATION, Medication Name:  1 squatty potty to use as needed, Disp: 1 each, Rfl: 0 .  Ascorbic Acid (VITAMIN C) 1000 MG tablet, Take 1,000 mg by mouth See admin instructions. Take 1000 mg daily, may take an additional 1000 mg as needed for cold symptoms, Disp: , Rfl:  .  aspirin EC 81 MG tablet, Take 81 mg by mouth at bedtime. , Disp: , Rfl:  .  benzonatate (TESSALON) 200 MG capsule, Take 1 capsule (200 mg total) by mouth 3 (three) times daily as needed for cough., Disp: 30 capsule, Rfl: 3 .  carvedilol (COREG) 6.25 MG tablet, Take 0.5 tablets (3.125 mg total) by mouth 2 (two) times daily with a meal., Disp: , Rfl:  .  cholecalciferol (VITAMIN D) 1000 units tablet, Take 1,000 Units by mouth daily. , Disp: , Rfl:  .  clotrimazole (LOTRIMIN) 1 % cream, Apply 1 application topically 2 (two) times daily., Disp: 60 g, Rfl: 1 .  Coenzyme Q10 (COQ-10) 100 MG CAPS, Take 100 mg by mouth daily., Disp: , Rfl:  .  dextromethorphan (DELSYM) 30 MG/5ML liquid, Take 15 mg by mouth daily as needed for cough. , Disp: , Rfl:  .  diclofenac Sodium (VOLTAREN) 1 % GEL, Apply 1 application topically 4 (four) times daily., Disp: , Rfl:  .  ezetimibe (ZETIA) 10 MG tablet, Take 1 tablet (10 mg total) by mouth daily., Disp: 90 tablet, Rfl: 3 .  Fluticasone Furoate  (ARNUITY ELLIPTA) 100 MCG/ACT AEPB, Inhale 1 puff into the lungs daily., Disp: 30 each, Rfl: 11 .  GARLIC PO, Take 1 capsule by mouth daily., Disp: , Rfl:  .  guaiFENesin (MUCINEX) 600 MG 12 hr tablet, Take 600 mg by mouth daily., Disp: , Rfl:  .  levalbuterol (XOPENEX HFA) 45 MCG/ACT inhaler, Inhale 2 puffs into the lungs every 4 (four) hours as needed for wheezing or shortness of breath. (Patient taking differently: Inhale 2 puffs into the lungs every 4 (four) hours as needed for wheezing or shortness of breath. ), Disp: 1 Inhaler, Rfl: 3 .  LORazepam (ATIVAN) 0.5 MG tablet, Take 0.5-1 tablets (0.25-0.5 mg total) by mouth at bedtime as needed for anxiety or sleep., Disp: 30 tablet, Rfl: 1 .  Menthol, Topical Analgesic, (BIOFREEZE ROLL-ON EX), Apply topically., Disp: , Rfl:  .  miconazole (MICOTIN) 2 % powder, Apply 1 application topically as needed (yeast)., Disp: , Rfl:  .  nystatin (MYCOSTATIN) 100000 UNIT/ML suspension, Take 5 mLs (500,000 Units total) by mouth 3 (three) times daily., Disp: 120 mL, Rfl: 0 .  nystatin cream (MYCOSTATIN), Apply 1 application topically as needed for dry skin (yeast infection)., Disp: 30 g, Rfl: 3 .  potassium chloride (KLOR-CON) 10 MEQ tablet, Take 1 tablet (10 mEq total) by mouth daily as needed (with laxis)., Disp: 90 tablet,  Rfl: 3 .  pyridOXINE (VITAMIN B-6) 100 MG tablet, Take 100 mg by mouth daily., Disp: , Rfl:  .  Red Yeast Rice Extract (RED YEAST RICE PO), Take 1 capsule by mouth in the morning and at bedtime. , Disp: , Rfl:  .  sacubitril-valsartan (ENTRESTO) 24-26 MG, Take 1 tablet by mouth 2 (two) times daily., Disp: 180 tablet, Rfl: 3 .  spironolactone (ALDACTONE) 25 MG tablet, Take 0.5 tablets (12.5 mg total) by mouth daily for 30 days. (Patient taking differently: Take 25 mg by mouth daily. 1 tablet every other day), Disp: 15 tablet, Rfl: 0 .  torsemide (DEMADEX) 10 MG tablet, Take 1 tablet (10 mg total) by mouth every other day., Disp: 45 tablet, Rfl:  3 .  triamcinolone (NASACORT) 55 MCG/ACT AERO nasal inhaler, Place 2 sprays into the nose daily., Disp: , Rfl:  .  vitamin E 400 UNIT capsule, Take 400 Units by mouth daily., Disp: , Rfl:   Past Medical History: Past Medical History:  Diagnosis Date  . Abdominal aortic atherosclerosis ([place]) 11/2015   by CT  . Allergy   . Anemia   . Anxiety   . Automatic implantable cardioverter-defibrillator in situ 2012   a. MDT single lead ICD.  Marland Kitchen Bronchiectasis    a. possibly mild, treat URIs aggressively  . Cancer (HCC)    basal cell, arms & neck  . CHF (congestive heart failure) ([place])   . Chronic sinusitis   . COPD (chronic obstructive pulmonary disease) ([place])   . Depression   . Dyspnea   . Essential hypertension   . Fibrocystic breast disease   . Fibromyalgia   . GERD (gastroesophageal reflux disease)   . H/O multiple allergies 10/10/2013  . Hemorrhoids   . History of diverticulitis of colon   . History of pneumonia 2012  . Insomnia   . Irritable bowel syndrome   . Laryngeal nodule    Dr. Thomasena Edis  . Migraine without aura, without mention of intractable migraine without mention of status migrainosus   . Mitral regurgitation   . Nonischemic cardiomyopathy ([place])    a. Recovered LV fxn - prev as low as 25%, now 50-55% by echo 04/2014;  b. 2012 s/p MDT [id number] [place] VR single lead ICD.  Marland Kitchen Osteoarthritis    knees, back, hands, low back,   . Osteoporosis 01/2013   T-2.7 spine (01/2016)  . PONV (postoperative nausea and vomiting)   . sigmoid diverticulitis with perforation, abscess and fistula 09/30/2013  . Sleep apnea 2010   borderline, inconclusive- /w [place]     Tobacco Use: Social History   Tobacco Use  Smoking Status Never Smoker  Smokeless Tobacco Never Used  Tobacco Comment   Lived with smokers x 23 yrs, smoked herself "for a week"    Labs: Recent Review Flowsheet Data    Labs for ITP Cardiac and Pulmonary Rehab Latest Ref Rng & Units 10/16/2018 10/16/2018 11/27/2018  03/18/2019 12/01/2019   Cholestrol 0 - 200 mg/dL - - 231(H) 217(H) 232(H)   LDLCALC 0 - 99 mg/dL - - - 131(H) 149(H)   LDLDIRECT mg/dL - - 164.0 - -   HDL >39.00 mg/dL - - 45.20 46.60 47.70   Trlycerides 0 - 149 mg/dL - - 264.0(H) 199.0(H) 172.0(H)   Hemoglobin A1c 4.6 - 6.5 % - - 5.9 - 5.9   PHART 7.35 - 7.45 7.447 - - - -   PCO2ART 32 - 48 mmHg 40.1 - - - -   HCO3 20.0 -  28.0 mmol/L 27.6 26.2 - - -   TCO2 22 - 32 mmol/L 29 27 - - -   O2SAT % 95.0 63.0 - - -       Exercise Target Goals: Exercise Program Goal: Individual exercise prescription set using results from initial 6 min walk test and THRR while considering  patient's activity barriers and safety.   Exercise Prescription Goal: Initial exercise prescription builds to 30-45 minutes a day of aerobic activity, 2-3 days per week.  Home exercise guidelines will be given to patient during program as part of exercise prescription that the participant will acknowledge.   Education: Aerobic Exercise & Resistance Training: - Gives group verbal and written instruction on the various components of exercise. Focuses on aerobic and resistive training programs and the benefits of this training and how to safely progress through these programs..   Education: Exercise & Equipment Safety: - Individual verbal instruction and demonstration of equipment use and safety with use of the equipment.   Cardiac Rehab from 12/17/2019 in [facility] Cardiac and Pulmonary Rehab  Date 10/01/19  Educator [facility]  Instruction Review Code 1- Verbalizes Understanding      Education: Exercise Physiology & General Exercise Guidelines: - Group verbal and written instruction with models to review the exercise physiology of the cardiovascular system and associated critical values. Provides general exercise guidelines with specific guidelines to those with heart or lung disease.    Cardiac Rehab from 12/17/2019 in [facility] Cardiac and Pulmonary Rehab  Date 11/26/19  Educator jh    Instruction Review Code 1- Verbalizes Understanding      Education: Flexibility, Balance, Mind/Body Relaxation: Provides group verbal/written instruction on the benefits of flexibility and balance training, including mind/body exercise modes such as yoga, pilates and tai chi.  Demonstration and skill practice provided.   Activity Barriers & Risk Stratification:  Activity Barriers & Cardiac Risk Stratification - 09/30/19 1417      Activity Barriers & Cardiac Risk Stratification   Activity Barriers Arthritis;Muscular Weakness;Deconditioning;Shortness of Breath;Decreased Ventricular Function;Other (comment);Neck/Spine Problems;Back Problems;Joint Problems;Balance Concerns;History of Falls    Comments neuropathy ins hands and feet, knees can be bothersome, arthritis in neck, calified disc, IBS issues, bending over causes SOB and fatigue    Cardiac Risk Stratification High           6 Minute Walk:  6 Minute Walk    Row Name 10/01/19 1434         6 Minute Walk   Phase Initial     Distance 870 feet     Walk Time 6 minutes     # of Rest Breaks 0     MPH 1.65     METS 2.12     RPE 13     Perceived Dyspnea  2     VO2 Peak 7.43     Symptoms Yes (comment)     Comments tired, SOB, shuffling gait (uses walking stick)     Resting HR 94 bpm     Resting BP 122/70     Resting Oxygen Saturation  96 %     Exercise Oxygen Saturation  during 6 min walk 98 %     Max Ex. HR 107 bpm     Max Ex. BP 156/70     2 Minute Post BP 134/70            Oxygen Initial Assessment:   Oxygen Re-Evaluation:   Oxygen Discharge (Final Oxygen Re-Evaluation):   Initial Exercise Prescription:  Initial Exercise Prescription -  10/01/19 1400      Date of Initial Exercise RX and Referring Provider   Date 10/01/19    Referring Provider [name] MD      Treadmill   MPH 1.5    Grade 0    Minutes 15    METs 2.15      Recumbant Bike   Level 1    Watts 12    Minutes 15    METs 2       Arm Ergometer   Level 1    Watts 25    RPM 25    Minutes 15    METs 2      REL-XR   Level 1    Speed 50    Minutes 15    METs 2      Prescription Details   Frequency (times per week) 3    Duration Progress to 30 minutes of continuous aerobic without signs/symptoms of physical distress      Intensity   THRR 40-80% of Max Heartrate 115-136    Ratings of Perceived Exertion 11-13    Perceived Dyspnea 0-4      Progression   Progression Continue to progress workloads to maintain intensity without signs/symptoms of physical distress.      Resistance Training   Training Prescription Yes    Weight 3 lb    Reps 10-15           Perform Capillary Blood Glucose checks as needed.  Exercise Prescription Changes:  Exercise Prescription Changes    Row Name 10/01/19 1400 10/14/19 1600 11/11/19 1500 11/25/19 1400 12/09/19 1400     Response to Exercise   Blood Pressure (Admit) 122/70 110/68 104/60 120/66 124/64   Blood Pressure (Exercise) 156/70 128/56 118/66 122/80 110/68   Blood Pressure (Exit) 134/70 126/70 112/62 108/64 92/60   Heart Rate (Admit) 94 bpm 89 bpm 106 bpm 71 bpm 76 bpm   Heart Rate (Exercise) 107 bpm 109 bpm 103 bpm 91 bpm 107 bpm   Heart Rate (Exit) 97 bpm 86 bpm 89 bpm 80 bpm 97 bpm   Oxygen Saturation (Admit) 96 % -- -- -- --   Oxygen Saturation (Exercise) 98 % -- -- -- --   Rating of Perceived Exertion (Exercise) _0 Perceived Dyspnea (Exercise) 2 -- -- -- --   Symptoms tired, SOB, shuffling gait, using walking stick none none none none   Comments walk test results third full day of exercise  -- -- --   Duration -- Continue with 30 min of aerobic exercise without signs/symptoms of physical distress. Continue with 30 min of aerobic exercise without signs/symptoms of physical distress. Continue with 30 min of aerobic exercise without signs/symptoms of physical distress. Continue with 30 min of aerobic exercise without signs/symptoms of physical  distress.   Intensity -- THRR unchanged THRR unchanged THRR unchanged THRR unchanged     Progression   Progression -- Continue to progress workloads to maintain intensity without signs/symptoms of physical distress. Continue to progress workloads to maintain intensity without signs/symptoms of physical distress. Continue to progress workloads to maintain intensity without signs/symptoms of physical distress. Continue to progress workloads to maintain intensity without signs/symptoms of physical distress.   Average METs -- 2.13 3.2 2.75 2.8     Resistance Training   Training Prescription -- Yes Yes Yes Yes   Weight -- 2 lb 2 lb 2 lb 2 lb   Reps -- 10-15 10-15 10-15 10-15  Interval Training   Interval Training -- No No No No     Treadmill   MPH -- 1.5 -- -- --   Grade -- 0 -- -- --   Minutes -- 15 -- -- --   METs -- 2.15 -- -- --     Recumbant Bike   Level -- 1 -- -- --   Watts -- 15 -- -- --   Minutes -- 15 -- -- --   METs -- 2.15 -- -- --     Arm Ergometer   Level -- 1 -- -- --   Minutes -- 15 -- -- --   METs -- 2.1 -- -- --     REL-XR   Level -- _0 Speed -- -- -- 80 --   Minutes -- _1 METs -- -- 3.9 2.8 2.8   Row Name 12/10/19 1100 12/23/19 1400           Response to Exercise   Blood Pressure (Admit) -- 120/68      Blood Pressure (Exercise) -- 110/64      Blood Pressure (Exit) -- 106/64      Heart Rate (Admit) -- 68 bpm      Heart Rate (Exercise) -- 110 bpm      Heart Rate (Exit) -- 65 bpm      Rating of Perceived Exertion (Exercise) -- 14      Symptoms -- none      Duration -- Continue with 30 min of aerobic exercise without signs/symptoms of physical distress.      Intensity -- THRR unchanged        Progression   Progression -- Continue to progress workloads to maintain intensity without signs/symptoms of physical distress.      Average METs -- 2.3        Resistance Training   Training Prescription -- Yes      Weight -- 2 lb       Reps -- 10-15        Interval Training   Interval Training -- No        REL-XR   Level -- 3      Speed -- 50      Minutes -- 15      METs -- 2.3        Home Exercise Plan   Plans to continue exercise at Home (comment)  walking, staff videos Home (comment)  walking, staff videos      Frequency Add 2 additional days to program exercise sessions. Add 2 additional days to program exercise sessions.      Initial Home Exercises Provided 12/10/19 12/10/19             Exercise Comments:   Exercise Goals and Review:  Exercise Goals    Row Name 10/01/19 1437             Exercise Goals   Increase Physical Activity Yes       Intervention Provide advice, education, support and counseling about physical activity/exercise needs.;Develop an individualized exercise prescription for aerobic and resistive training based on initial evaluation findings, risk stratification, comorbidities and participant's personal goals.       Expected Outcomes Short Term: Attend rehab on a regular basis to increase amount of physical activity.;Long Term: Add in home exercise to make exercise part of routine and to increase amount of physical activity.;Long Term: Exercising regularly at least 3-5 days a week.  Increase Strength and Stamina Yes       Intervention Provide advice, education, support and counseling about physical activity/exercise needs.;Develop an individualized exercise prescription for aerobic and resistive training based on initial evaluation findings, risk stratification, comorbidities and participant's personal goals.       Expected Outcomes Short Term: Perform resistance training exercises routinely during rehab and add in resistance training at home;Short Term: Increase workloads from initial exercise prescription for resistance, speed, and METs.;Long Term: Improve cardiorespiratory fitness, muscular endurance and strength as measured by increased METs and functional capacity (6MWT)        Able to understand and use rate of perceived exertion (RPE) scale Yes       Intervention Provide education and explanation on how to use RPE scale       Expected Outcomes Short Term: Able to use RPE daily in rehab to express subjective intensity level;Long Term:  Able to use RPE to guide intensity level when exercising independently       Able to understand and use Dyspnea scale Yes       Intervention Provide education and explanation on how to use Dyspnea scale       Expected Outcomes Short Term: Able to use Dyspnea scale daily in rehab to express subjective sense of shortness of breath during exertion;Long Term: Able to use Dyspnea scale to guide intensity level when exercising independently       Knowledge and understanding of Target Heart Rate Range (THRR) Yes       Intervention Provide education and explanation of THRR including how the numbers were predicted and where they are located for reference       Expected Outcomes Short Term: Able to state/look up THRR;Short Term: Able to use daily as guideline for intensity in rehab;Long Term: Able to use THRR to govern intensity when exercising independently       Able to check pulse independently Yes       Intervention Provide education and demonstration on how to check pulse in carotid and radial arteries.;Review the importance of being able to check your own pulse for safety during independent exercise       Expected Outcomes Short Term: Able to explain why pulse checking is important during independent exercise;Long Term: Able to check pulse independently and accurately       Understanding of Exercise Prescription Yes       Intervention Provide education, explanation, and written materials on patient's individual exercise prescription       Expected Outcomes Short Term: Able to explain program exercise prescription;Long Term: Able to explain home exercise prescription to exercise independently              Exercise Goals Re-Evaluation :   Exercise Goals Re-Evaluation    Ashley [name] Name 10/03/19 1113 10/14/19 1605 11/11/19 1524 11/12/19 1138 11/25/19 1420     Exercise Goal Re-Evaluation   Exercise Goals Review Increase Physical Activity;Able to understand and use rate of perceived exertion (RPE) scale;Knowledge and understanding of Target Heart Rate Range (THRR);Understanding of Exercise Prescription;Increase Strength and Stamina;Able to check pulse independently Increase Physical Activity;Increase Strength and Stamina;Understanding of Exercise Prescription Increase Physical Activity;Increase Strength and Stamina;Understanding of Exercise Prescription Increase Physical Activity;Increase Strength and Stamina;Understanding of Exercise Prescription Increase Physical Activity;Increase Strength and Stamina;Understanding of Exercise Prescription   Comments Reviewed RPE and dyspnea scales, THR and program prescription with pt today.  Pt voiced understanding and was given a copy of goals to take home. Ashley [name] is off to a  good start in rehab.  She has completed her first three sessions thus far.  We will continue to encourage good attendance.  We will continue to monitor her progress. Ashley [name] has been doing well in rehab.  She has requested that we move her to just the XR as that was the only piece that did not cause her to hurt.  She is up to level 2 on there now.  We will continue to monitor her progress. 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  She uses Biofreeze and has had pain injections for her neck.  She cant have the nerve block due to heart issues.  She does feel ok doing XR 3 times per week.  We discussed Arthritis exercise class at [facility] as an option when she completes HT. Ashley [name] has increased to level 3 on the XR.  She attends consistently and works at [employer].  Staff will monitor progress.   Expected Outcomes Short: Use RPE daily to regulate intensity. Long: Follow program prescription in THR.  Short: Continue to attend regularly Long: Continue to follow program prescription. Short: Try level 3 on XR Long: Continue to attend regularly. Short: continue to exercise as tolerated  Long: build stamina Short:  continue to attend consistently Long:  increase stamina and MET level   Row Name 12/09/19 1423 12/10/19 1117 12/23/19 1408         Exercise Goal Re-Evaluation   Exercise Goals Review Increase Physical Activity;Increase Strength and Stamina;Understanding of Exercise Prescription Increase Physical Activity;Increase Strength and Stamina;Understanding of Exercise Prescription Increase Physical Activity;Increase Strength and Stamina;Understanding of Exercise Prescription     Comments Ashley [name] has been doing well in rehab.  She continues with level 3 on the XR as when she tried level 4 it started to aggravate her back.  We will continue to monitor her progress. Ashley [name] is doing well in rehab.  She has not really noticed much difference in her strength and stamina.  She stays busy keeping up with house and her garden but does not exercise as those things make her exhausted.  Reviewed home exercise with pt today.  Pt plans to walk and try staff videos for exercise.  Reviewed THR, pulse, RPE, sign and symptoms, pulse oximetery and when to call 911 or MD.  Also discussed weather considerations and indoor options.  Pt voiced understanding. Ashley [name] attends consistently and does as much as she is able.  Staff will monitor progress.     Expected Outcomes Short: Continue work hard while here  Long: Continue to improve stamina Short: Start to add in exercise at home Long; Improve stamina. Short:  continue to exercise consistently Long: increase stamina            Discharge Exercise Prescription (Final Exercise Prescription Changes):  Exercise Prescription Changes - 12/23/19 1400      Response to Exercise   Blood Pressure (Admit) 120/68    Blood Pressure (Exercise) 110/64    Blood Pressure (Exit) 106/64     Heart Rate (Admit) 68 bpm    Heart Rate (Exercise) 110 bpm    Heart Rate (Exit) 65 bpm    Rating of Perceived Exertion (Exercise) 14    Symptoms none    Duration Continue with 30 min of aerobic exercise without signs/symptoms of physical distress.    Intensity THRR unchanged      Progression   Progression Continue to progress workloads to maintain intensity without signs/symptoms of physical  distress.    Average METs 2.3      Resistance Training   Training Prescription Yes    Weight 2 lb    Reps 10-15      Interval Training   Interval Training No      REL-XR   Level 3    Speed 50    Minutes 15    METs 2.3      Home Exercise Plan   Plans to continue exercise at Home (comment)   walking, staff videos   Frequency Add 2 additional days to program exercise sessions.    Initial Home Exercises Provided 12/10/19           Nutrition:  Target Goals: Understanding of nutrition guidelines, daily intake of sodium '1500mg'$ , cholesterol '200mg'$ , calories 30% from fat and 7% or less from saturated fats, daily to have 5 or more servings of fruits and vegetables.  Education: Controlling Sodium/Reading Food Labels -Group verbal and written material supporting the discussion of sodium use in heart healthy nutrition. Review and explanation with models, verbal and written materials for utilization of the food label.   Education: General Nutrition Guidelines/Fats and Fiber: -Group instruction provided by verbal, written material, models and posters to present the general guidelines for heart healthy nutrition. Gives an explanation and review of dietary fats and fiber.   Biometrics:  Pre Biometrics - 10/01/19 1437      Pre Biometrics   Height 5' 6.3" (1.684 m)    Weight 178 lb 9.6 oz (81 kg)    BMI (Calculated) 28.57    Single Leg Stand 5.66 seconds            Nutrition Therapy Plan and Nutrition Goals:  Nutrition Therapy & Goals - 10/15/19 1134      Nutrition Therapy   Diet  heart healthy, low Na    Protein (specify units) 75-80g    Fiber 25 grams    Whole Grain Foods 3 servings    Saturated Fats 12 max. grams    Fruits and Vegetables 5 servings/day    Sodium 1.5 grams      Personal Nutrition Goals   Nutrition Goal ST: add protein to breakfast or snack (discussed nuts/seeds)LT: Increase EF - now 30%    Comments Pt reports getting sick every 6 months which lowers her EF - has chronic bronchitis. Pt gets SOB very easily- affects ADLs. cooking or shopping is harder for her at this time due to SOB and fatigue. [place] - cheese biscuit (no butter and removed insides with 1 slice of cheese) with medium sweet tea. L: Fruit, ham, leftover salad S: salt free popcorn and fruit or an oatmeal raisin cookie D: two vegetables (zucchini, onion,  sweet potatoes, and carrots and green beans, yellow squash, avocado) and low salt meat (salmon or other fish), brown rice, low fat dairy. Sometimes ice cream. IBS - onions are trigger for her, will only have it sometimes. Pt has tried to remove cheese and dairy but it didn't seem to help. Doesn't cook with salt and is mindful about processed foods and salt. Pt doesn't like peanut butter. Discussed heart healthy eating as well as pulmonary MNT.      Intervention Plan   Intervention Prescribe, educate and counsel regarding individualized specific dietary modifications aiming towards targeted core components such as weight, hypertension, lipid management, diabetes, heart failure and other comorbidities.;Nutrition handout(s) given to patient.    Expected Outcomes Short Term Goal: Understand basic principles of dietary content, such as calories,  fat, sodium, cholesterol and nutrients.;Short Term Goal: A plan has been developed with personal nutrition goals set during dietitian appointment.;Long Term Goal: Adherence to prescribed nutrition plan.           Nutrition Assessments:  Nutrition Assessments - 10/01/19 1438      MEDFICTS Scores   Pre  Score 48           MEDIFICTS Score Key:          ?70 Need to make dietary changes          40-70 Heart Healthy Diet         ? 40 Therapeutic Level Cholesterol Diet  Nutrition Goals Re-Evaluation:  Nutrition Goals Re-Evaluation    Ashley [name] Name 11/12/19 1144 12/10/19 1126           Goals   Nutrition Goal -- Add protein      Comment Continue changes recommended by RD Continue changes recommended by RD.  She admits to eating a little more sugar recently as she has been feeling more down recently.      Expected Outcome -- Short: Continue to follow changes Long: Focus on heart healthy.             Nutrition Goals Discharge (Final Nutrition Goals Re-Evaluation):  Nutrition Goals Re-Evaluation - 12/10/19 1126      Goals   Nutrition Goal Add protein    Comment Continue changes recommended by RD.  She admits to eating a little more sugar recently as she has been feeling more down recently.    Expected Outcome Short: Continue to follow changes Long: Focus on heart healthy.           Psychosocial: Target Goals: Acknowledge presence or absence of significant depression and/or stress, maximize coping skills, provide positive support system. Participant is able to verbalize types and ability to use techniques and skills needed for reducing stress and depression.   Education: Depression - Provides group verbal and written instruction on the correlation between heart/lung disease and depressed mood, treatment options, and the stigmas associated with seeking treatment.   Cardiac Rehab from 12/17/2019 in [facility] Cardiac and Pulmonary Rehab  Date 12/17/19  Educator [facility]  Instruction Review Code 1- [employer] Understanding      Education: Sleep Hygiene -Provides group verbal and written instruction about how sleep can affect your health.  Define sleep hygiene, discuss sleep cycles and impact of sleep habits. Review good sleep hygiene tips.     Education: Stress and Anxiety: - Provides group  verbal and written instruction about the health risks of elevated stress and causes of high stress.  Discuss the correlation between heart/lung disease and anxiety and treatment options. Review healthy ways to manage with stress and anxiety.   Cardiac Rehab from 12/17/2019 in [facility] Cardiac and Pulmonary Rehab  Date 12/17/19  Educator [facility]  Instruction Review Code 1- Verbalizes Understanding       Initial Review & Psychosocial Screening:  Initial Psych Review & Screening - 09/30/19 1423      Initial Review   Current issues with Current Sleep Concerns;Current Depression;Current Psychotropic Meds;Current Stress Concerns    Source of Stress Concerns Chronic Illness;Family    Comments Constantly dealing with health, does not sleep well and will nap during day,  feels well managed on current medication, Son and family live in [place] and they don't see them as much as they would like to, not on their radar      Family Dynamics   Good Support  System? Yes   husband   Comments Son and family live in [place] and they don't see them as much as they would like to, not on their radar (grandkids 8th grade and high school)      Barriers   Psychosocial barriers to participate in program The patient should benefit from training in stress management and relaxation.;Psychosocial barriers identified (see note)      Screening Interventions   Interventions Encouraged to exercise;Provide feedback about the scores to participant;To provide support and resources with identified psychosocial needs    Expected Outcomes Short Term goal: Utilizing psychosocial counselor, staff and physician to assist with identification of specific Stressors or current issues interfering with healing process. Setting desired goal for each stressor or current issue identified.;Long Term Goal: Stressors or current issues are controlled or eliminated.;Short Term goal: Identification and review with participant of any Quality of Life or  Depression concerns found by scoring the questionnaire.;Long Term goal: The participant improves quality of Life and PHQ9 Scores as seen by post scores and/or verbalization of changes           Quality of Life Scores:   Quality of Life - 10/01/19 1437      Quality of Life   Select Quality of Life      Quality of Life Scores   Health/Function Pre 8.07 %    Socioeconomic Pre 21.57 %    Psych/Spiritual Pre 17.79 %    Family Pre 18 %    GLOBAL Pre 14.37 %          Scores of 19 and below usually indicate a poorer quality of life in these areas.  A difference of  2-3 points is a clinically meaningful difference.  A difference of 2-3 points in the total score of the Quality of Life Index has been associated with significant improvement in overall quality of life, self-image, physical symptoms, and general health in studies assessing change in quality of life.  PHQ-9: Recent Review Flowsheet Data    Depression screen [facility] 2/9 12/01/2019 12/01/2019 11/05/2019 10/01/2019 11/27/2018   Decreased Interest 0 1 0 2 0   Down, Depressed, Hopeless 0 _0 PHQ - 2 Score 0 _1 Altered sleeping 0 _2 -   Tired, decreased energy 0 _3 -   Change in appetite 0 1 1 0 -   Feeling bad or failure about yourself  0 0 0 0 -   Trouble concentrating 0 _4 -   Moving slowly or fidgety/restless 0 0 0 0 -   Suicidal thoughts 0 0 0 0 -   PHQ-9 Score 0 _5 -   Difficult doing work/chores Not difficult at all Somewhat difficult Somewhat difficult Very difficult -     Interpretation of Total Score  Total Score Depression Severity:  1-4 = Minimal depression, 5-9 = Mild depression, 10-14 = Moderate depression, 15-19 = Moderately severe depression, 20-27 = Severe depression   Psychosocial Evaluation and Intervention:  Psychosocial Evaluation - 09/30/19 1431      Psychosocial Evaluation & Interventions   Interventions Stress management education;Encouraged to exercise with the program and follow  exercise prescription    Comments Ashley [name] is coming into rehab for a second time to boost her EF from her heart failure. She enjoyed it last time and is looking forward to doing it again.  She has a history of depression but is currently feeling well  managed for her mood, but she is not sleeping well and wants to talk with her doctor about possibly changing her medication to one that would also help her sleep better.  She is often up at night from pain and trips to bathroom, but she does nap most afternoons.  She is frustrated with her son and his family as they do not see them as much as she would like.  Her son lives in [place], and she used to care for her grandchildren all through their early years and now they rarely see them.  She has an odd reaction to most new medications and has to be careful whenever starting anything new as she does not know how her body witll respond.  He husband is her primary support system but also their neighbors.  She enjoys gardening and helping keep up the house.  Her biggest goal is to improve her EF again and to try to decrease some of her pain, if possible.    Expected Outcomes Short: Attend rehab for exercise to strengthen heart and boost stamina.  Long: Exercise to help improve sleep.    Continue Psychosocial Services  Follow up required by staff           Psychosocial Re-Evaluation:  Psychosocial Re-Evaluation    Ashley [name] Name 11/12/19 1144 12/01/19 1142 12/10/19 1118         Psychosocial Re-Evaluation   Current issues with Current Stress Concerns;Current Sleep Concerns Current Psychotropic Meds;Current Sleep Concerns;Current Stress Concerns Current Psychotropic Meds;Current Sleep Concerns;Current Stress Concerns     Comments Ashley [name] still does not sleep well consistently due to health conditions.  She has spoken to her Dr about sleep.  She doesnt want to take more medication.  She does take naps during the day.  She feels her mental health is as good as it can get.   She does have hobbies she enjoys and also has friends over for dinner and to play cards. Reviewed patient health questionnaire (PHQ-9) with patient for follow up. Previously, patients score indicated signs/symptoms of depression.  Reviewed to see if patient is improving symptom wise while in program.  Score declined and patient states that it is because she is not able to sleep well and has bowel issues. She said she has been dealing with it since 2012 and nothing has helped. She is still not sleeping well and feels tired all the time.  She has halved her anti-anxiety med to help her sleep better over the last week.  She admits to feeling down, sad, and hopeless most of the time.  She is on antidepressant and her anti-anxiety is makes her feel to low or too high.  She does not like how the meds make her feel. She knows that as long as she is fighting she is alive.  She feels that coming to exericse is an added stressor.  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  We looked at her METs to show the progression that we are seeing.  Sometimes she will nap for 3-4 hours in the afternoon.     Expected Outcomes Short:  continue to do social activities Long:  maintain positive outlook Short: Continue to work toward an improvement in Boyce scores by attending [facility] regularly. Long: Continue to improve stress and depression coping skills by talking with staff and attending [facility] regularly and work toward a positive mental state. Short: Continue to exercise for mental boost Long: Continue to try to find the positive  Interventions -- Encouraged to attend Cardiac Rehabilitation for the exercise Encouraged to attend Cardiac Rehabilitation for the exercise     Continue Psychosocial Services  -- Follow up required by staff Follow up required by staff            Psychosocial Discharge (Final Psychosocial Re-Evaluation):  Psychosocial Re-Evaluation - 12/10/19 1118      Psychosocial Re-Evaluation   Current  issues with Current Psychotropic Meds;Current Sleep Concerns;Current Stress Concerns    Comments She is still not sleeping well and feels tired all the time.  She has halved her anti-anxiety med to help her sleep better over the last week.  She admits to feeling down, sad, and hopeless most of the time.  She is on antidepressant and her anti-anxiety is makes her feel to low or too high.  She does not like how the meds make her feel. She knows that as long as she is fighting she is alive.  She feels that coming to exericse is an added stressor.  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  We looked at her METs to show the progression that we are seeing.  Sometimes she will nap for 3-4 hours in the afternoon.    Expected Outcomes Short: Continue to exercise for mental boost Long: Continue to try to find the positive    Interventions Encouraged to attend Cardiac Rehabilitation for the exercise    Continue Psychosocial Services  Follow up required by staff           Vocational Rehabilitation: Provide vocational rehab assistance to qualifying candidates.   Vocational Rehab Evaluation & Intervention:  Vocational Rehab - 09/30/19 1423      Initial Vocational Rehab Evaluation & Intervention   Assessment shows need for Vocational Rehabilitation No           Education: Education Goals: Education classes will be provided on a variety of topics geared toward better understanding of heart health and risk factor modification. Participant will state understanding/return demonstration of topics presented as noted by education test scores.  Learning Barriers/Preferences:  Learning Barriers/Preferences - 09/30/19 1417      Learning Barriers/Preferences   Learning Barriers --   arthritis and trigger finger can cause her to go slowly   Learning Preferences Skilled Demonstration           General Cardiac Education Topics:  AED/CPR: - Group verbal and written instruction with the use of models to  demonstrate the basic use of the AED with the basic ABC's of resuscitation.   Anatomy & Physiology of the Heart: - Group verbal and written instruction and models provide basic cardiac anatomy and physiology, with the coronary electrical and arterial systems. Review of Valvular disease and Heart Failure   Cardiac Procedures: - Group verbal and written instruction to review commonly prescribed medications for heart disease. Reviews the medication, class of the drug, and side effects. Includes the steps to properly store meds and maintain the prescription regimen. (beta blockers and nitrates)   Cardiac Medications I: - Group verbal and written instruction to review commonly prescribed medications for heart disease. Reviews the medication, class of the drug, and side effects. Includes the steps to properly store meds and maintain the prescription regimen.   Cardiac Rehab from 12/17/2019 in [facility] Cardiac and Pulmonary Rehab  Date 12/10/19  Educator [facility]  Instruction Review Code 1- Verbalizes Understanding      Cardiac Medications II: -Group verbal and written instruction to review commonly prescribed medications for heart disease.  Reviews the medication, class of the drug, and side effects. (all other drug classes)    Go Sex-Intimacy & Heart Disease, Get SMART - Goal Setting: - Group verbal and written instruction through game format to discuss heart disease and the return to sexual intimacy. Provides group verbal and written material to discuss and apply goal setting through the application of the S.M.A.R.T. Method.   Other Matters of the Heart: - Provides group verbal, written materials and models to describe Stable Angina and Peripheral Artery. Includes description of the disease process and treatment options available to the cardiac patient.   Infection Prevention: - Provides verbal and written material to individual with discussion of infection control including proper hand washing and  proper equipment cleaning during exercise session.   Cardiac Rehab from 12/17/2019 in [facility] Cardiac and Pulmonary Rehab  Date 10/01/19  Educator [facility]  Instruction Review Code 1- Verbalizes Understanding      Falls Prevention: - Provides verbal and written material to individual with discussion of falls prevention and safety.   Cardiac Rehab from 12/17/2019 in [facility] Cardiac and Pulmonary Rehab  Date 10/01/19  Educator [facility]  Instruction Review Code 1- Verbalizes Understanding      Other: -Provides group and verbal instruction on various topics (see comments)   Knowledge Questionnaire Score:  Knowledge Questionnaire Score - 10/01/19 1438      Knowledge Questionnaire Score   Pre Score 20/26 Education Focus: Exercise, Nutrition, Anatomy           Core Components/Risk Factors/Patient Goals at Admission:  Personal Goals and Risk Factors at Admission - 10/01/19 1438      Core Components/Risk Factors/Patient Goals on Admission    Weight Management Yes;Weight Loss    Intervention Weight Management: Develop a combined nutrition and exercise program designed to reach desired caloric intake, while maintaining appropriate intake of nutrient and fiber, sodium and fats, and appropriate energy expenditure required for the weight goal.;Weight Management: Provide education and appropriate resources to help participant work on and attain dietary goals.    Admit Weight 178 lb 9.6 oz (81 kg)    Goal Weight: Short Term 173 lb (78.5 kg)    Goal Weight: Long Term 168 lb (76.2 kg)    Expected Outcomes Short Term: Continue to assess and modify interventions until short term weight is achieved;Long Term: Adherence to nutrition and physical activity/exercise program aimed toward attainment of established weight goal;Weight Loss: Understanding of general recommendations for a balanced deficit meal plan, which promotes 1-2 lb weight loss per week and includes a negative energy balance of [phone] kcal/d;Understanding  recommendations for meals to include 15-35% energy as protein, 25-35% energy from fat, 35-60% energy from carbohydrates, less than 271m of dietary cholesterol, 20-35 gm of total fiber daily;Understanding of distribution of calorie intake throughout the day with the consumption of 4-5 meals/snacks    Heart Failure Yes    Intervention Provide a combined exercise and nutrition program that is supplemented with education, support and counseling about heart failure. Directed toward relieving symptoms such as shortness of breath, decreased exercise tolerance, and extremity edema.    Expected Outcomes Improve functional capacity of life;Short term: Attendance in program 2-3 days a week with increased exercise capacity. Reported lower sodium intake. Reported increased fruit and vegetable intake. Reports medication compliance.;Short term: Daily weights obtained and reported for increase. Utilizing diuretic protocols set by physician.;Long term: Adoption of self-care skills and reduction of barriers for early signs and symptoms recognition and intervention leading to self-care maintenance.  Lipids Yes    Intervention Provide education and support for participant on nutrition & aerobic/resistive exercise along with prescribed medications to achieve LDL <31m, HDL >480m    Expected Outcomes Short Term: Participant states understanding of desired cholesterol values and is compliant with medications prescribed. Participant is following exercise prescription and nutrition guidelines.;Long Term: Cholesterol controlled with medications as prescribed, with individualized exercise RX and with personalized nutrition plan. Value goals: LDL < 7026mHDL > 40 mg.           Education:Diabetes - Individual verbal and written instruction to review signs/symptoms of diabetes, desired ranges of glucose level fasting, after meals and with exercise. Acknowledge that pre and post exercise glucose checks will be done for 3 sessions  at entry of program.   Education: Know Your Numbers and Risk Factors: -Group verbal and written instruction about important numbers in your health.  Discussion of what are risk factors and how they play a role in the disease process.  Review of Cholesterol, Blood Pressure, Diabetes, and BMI and the role they play in your overall health.   Core Components/Risk Factors/Patient Goals Review:   Goals and Risk Factor Review    Row Name 11/12/19 1134 12/10/19 1124           Core Components/Risk Factors/Patient Goals Review   Personal Goals Review Weight Management/Obesity;Heart Failure;Hypertension;Lipids Weight Management/Obesity;Heart Failure;Hypertension;Lipids      Review Ashley [name] is taking all meds as directed.  She checks her BP at home if she doesnt feel well.  She has more trouble with low BP than high.  She is monitoring weight at home.  Her HF symptoms are the same.  She still has chest pain and has been working with her Dr on this since 2012.  She is watching her diet for GI reasons. Ashley [name] has gained a little weight over the last few months. Blood pressures have been good for most part.  She has had a few days where it runs low and then takes her most of the day to recover.  She continues to feel like to is just fighting each day with her heart failure symptoms.  She currently denies any swelling or chest pain.      Expected Outcomes Short: continue to exercise consistently and monitor risk factors Long: manage risk factors Short: Add in exercise to boost energy Long; Continue to monitor risk factors.             Core Components/Risk Factors/Patient Goals at Discharge (Final Review):   Goals and Risk Factor Review - 12/10/19 1124      Core Components/Risk Factors/Patient Goals Review   Personal Goals Review Weight Management/Obesity;Heart Failure;Hypertension;Lipids    Review Ashley [name] has gained a little weight over the last few months. Blood pressures have been good for most part.  She  has had a few days where it runs low and then takes her most of the day to recover.  She continues to feel like to is just fighting each day with her heart failure symptoms.  She currently denies any swelling or chest pain.    Expected Outcomes Short: Add in exercise to boost energy Long; Continue to monitor risk factors.           ITP Comments:  ITP Comments    Row Name 09/30/19 1437 10/01/19 1434 10/03/19 1113 10/29/19 0638 10/29/19 1240   ITP Comments Completed virtual orientation today.  EP evaluation is scheduled for Thursday 5/19 at 1130.  Documentation for diagnosis can  be found in [facility] encounter 09/22/19. Completed 6MWT and gym orientation.  Initial ITP created and sent for review to Dr. Emily Filbert, Medical Director. First full day of exercise!  Patient was oriented to gym and equipment including functions, settings, policies, and procedures.  Patient's individual exercise prescription and treatment plan were reviewed.  All starting workloads were established based on the results of the 6 minute walk test done at initial orientation visit.  The plan for exercise progression was also introduced and progression will be customized based on patient's performance and goals 30 Day review completed. Medical Director ITP review done, changes made as directed, and signed approval by Medical Director. Pt has not attended since 10/17/19.   [place] Name 11/11/19 1523 11/26/19 1102 12/24/19 0633       ITP Comments Ashley [name] came on 6/28 but had to send her home due to her lack of toe covered shoes. 30 Day review completed. Medical Director ITP review done, changes made as directed, and signed approval by Medical Director. 30 Day review completed. Medical Director ITP review done, changes made as directed, and signed approval by Medical Director.            Comments:

## 2019-12-26 ENCOUNTER — Telehealth: Payer: Self-pay | Admitting: Family Medicine

## 2019-12-26 NOTE — Telephone Encounter (Signed)
Pt would like a letter or something that states she is exempt from getting Covid vaccine. She said it could kill her and she doesn't want it. She said with her medical conditions it should be enough to be exempt. She has bad reactions to vaccines. She heard that a lot of places are requiring it and she needs an exemption. She would like a call back.

## 2019-12-26 NOTE — Telephone Encounter (Addendum)
I don't think she will be required to get covid vaccine, but let us know if she is told otherwise and I can prepare letter for her.  Don't think we need to prepare letter until if/when she is being told she is required.

## 2019-12-29 ENCOUNTER — Other Ambulatory Visit: Payer: Self-pay

## 2019-12-29 ENCOUNTER — Encounter: Payer: Medicare Other | Admitting: *Deleted

## 2019-12-29 DIAGNOSIS — I5022 Chronic systolic (congestive) heart failure: Secondary | ICD-10-CM

## 2019-12-29 NOTE — Telephone Encounter (Signed)
Spoke with pt relaying Dr. Synthia Innocent message.  Pt verbalizes understanding but insists she wants a letter to have in had to prevent an legal situations with someone trying to give her the shot.  Pt is aware Dr. Darnell Level is out of the office.

## 2019-12-29 NOTE — Progress Notes (Signed)
Daily Session Note  Patient Details  Name: Ashley [name] MRN: [id number] Date of Birth: 24-Aug-1945 Referring Provider:     Cardiac Rehab from 10/01/2019 in [facility] Cardiac and Pulmonary Rehab  Referring Provider Ashley Rogue MD      Encounter Date: 12/29/2019  Check In:  Session Check In - 12/29/19 1156      Check-In   Supervising physician immediately available to respond to emergencies See telemetry face sheet for immediately available ER MD    Location [facility]-Cardiac & Pulmonary Rehab    Staff Present Ashley Lark, RN, BSN, Ashley [name], BS, ACSM CEP, Exercise Physiologist;Ashley [name], [place] Exercise Physiologist    Virtual Visit No    Medication changes reported     No    Fall or balance concerns reported    No    Warm-up and Cool-down Performed on first and last piece of equipment    Resistance Training Performed Yes    VAD Patient? No    PAD/SET Patient? No      Pain Assessment   Currently in Pain? No/denies              Social History   Tobacco Use  Smoking Status Never Smoker  Smokeless Tobacco Never Used  Tobacco Comment   Lived with smokers x 23 yrs, smoked herself "for a week"    Goals Met:  Independence with exercise equipment Exercise tolerated well No report of cardiac concerns or symptoms  Goals Unmet:  Not Applicable  Comments: Pt able to follow exercise prescription today without complaint.  Will continue to monitor for progression.    Dr. Emily [name] is Medical Director for [name] [facility].

## 2020-01-02 ENCOUNTER — Encounter: Payer: Medicare Other | Admitting: *Deleted

## 2020-01-02 ENCOUNTER — Other Ambulatory Visit: Payer: Self-pay

## 2020-01-02 DIAGNOSIS — I5022 Chronic systolic (congestive) heart failure: Secondary | ICD-10-CM

## 2020-01-02 NOTE — Progress Notes (Signed)
Daily Session Note  Patient Details  Name: Ashley [name] MRN: [id number] Date of Birth: 31-Jan-1946 Referring Provider:     Cardiac Rehab from 10/01/2019 in [facility] Cardiac and Pulmonary Rehab  Referring Provider [name] MD      Encounter Date: 01/02/2020  Check In:  Session Check In - 01/02/20 1107      Check-In   Supervising physician immediately available to respond to emergencies See telemetry face sheet for immediately available ER MD    Location [facility]-Cardiac & Pulmonary Rehab    Staff Present Heath Lark, RN, BSN, CCRP;Melissa [place] RDN, LDN;Jessica [place], MA, RCEP, CCRP, CCET;Joseph [employer] Visit No    Medication changes reported     No    Fall or balance concerns reported    No    Warm-up and Cool-down Performed on first and last piece of equipment    Resistance Training Performed Yes    VAD Patient? No    PAD/SET Patient? No      Pain Assessment   Currently in Pain? No/denies              Social History   Tobacco Use  Smoking Status Never Smoker  Smokeless Tobacco Never Used  Tobacco Comment   Lived with smokers x 23 yrs, smoked herself "for a week"    Goals Met:  Independence with exercise equipment Exercise tolerated well No report of cardiac concerns or symptoms  Goals Unmet:  Not Applicable  Comments: Pt able to follow exercise prescription today without complaint.  Will continue to monitor for progression.    Dr. Emily Filbert is Medical Director for [place] [facility].

## 2020-01-05 ENCOUNTER — Encounter: Payer: Medicare Other | Admitting: *Deleted

## 2020-01-05 ENCOUNTER — Other Ambulatory Visit: Payer: Self-pay

## 2020-01-05 DIAGNOSIS — I5022 Chronic systolic (congestive) heart failure: Secondary | ICD-10-CM | POA: Diagnosis not present

## 2020-01-05 NOTE — Progress Notes (Signed)
Daily Session Note  Patient Details  Name: Ashley [name] MRN: [id number] Date of Birth: [birth year] Referring Provider:     Cardiac Rehab from 10/01/2019 in [facility] Cardiac and Pulmonary Rehab  Referring Provider [name] MD      Encounter Date: 01/05/2020  Check In:  Session Check In - 01/05/20 1117      Check-In   Supervising physician immediately available to respond to emergencies See telemetry face sheet for immediately available ER MD    Location [facility]-Cardiac & Pulmonary Rehab    Staff Present Heath Lark, RN, BSN, CCRP;Jessica [place], MA, RCEP, CCRP, [place], BS, ACSM CEP, Exercise Physiologist    Virtual Visit No    Medication changes reported     No    Fall or balance concerns reported    No    Warm-up and Cool-down Performed on first and last piece of equipment    Resistance Training Performed Yes    VAD Patient? No    PAD/SET Patient? No      Pain Assessment   Currently in Pain? No/denies              Social History   Tobacco Use  Smoking Status Never Smoker  Smokeless Tobacco Never Used  Tobacco Comment   Lived with smokers x 23 yrs, smoked herself "for a week"    Goals Met:  Independence with exercise equipment Exercise tolerated well No report of cardiac concerns or symptoms  Goals Unmet:  Not Applicable  Comments: Pt able to follow exercise prescription today without complaint.  Will continue to monitor for progression.    Dr. Emily Filbert is Medical Director for [place] [facility].
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
## 2020-01-06 ENCOUNTER — Ambulatory Visit (INDEPENDENT_AMBULATORY_CARE_PROVIDER_SITE_OTHER): Payer: Medicare Other | Admitting: Cardiovascular Disease

## 2020-01-06 ENCOUNTER — Encounter: Payer: Self-pay | Admitting: Cardiovascular Disease

## 2020-01-06 ENCOUNTER — Other Ambulatory Visit: Payer: Self-pay

## 2020-01-06 VITALS — BP 120/80 | HR 99 | Ht 65.0 in | Wt 183.2 lb

## 2020-01-06 DIAGNOSIS — Z95810 Presence of automatic (implantable) cardiac defibrillator: Secondary | ICD-10-CM | POA: Diagnosis not present

## 2020-01-06 DIAGNOSIS — I5022 Chronic systolic (congestive) heart failure: Secondary | ICD-10-CM | POA: Diagnosis not present

## 2020-01-06 DIAGNOSIS — I420 Dilated cardiomyopathy: Secondary | ICD-10-CM | POA: Diagnosis not present

## 2020-01-06 DIAGNOSIS — I700 Atherosclerosis of aorta: Secondary | ICD-10-CM

## 2020-01-06 DIAGNOSIS — E782 Mixed hyperlipidemia: Secondary | ICD-10-CM

## 2020-01-06 DIAGNOSIS — I10 Essential (primary) hypertension: Secondary | ICD-10-CM

## 2020-01-06 MED ORDER — POTASSIUM CHLORIDE ER 10 MEQ PO TBCR: 10.0000 meq | EXTENDED_RELEASE_TABLET | Freq: Every day | ORAL | 3 refills | Status: DC | PRN

## 2020-01-06 MED ORDER — BENZONATATE 200 MG PO CAPS: 200.0000 mg | ORAL_CAPSULE | Freq: Three times a day (TID) | ORAL | 3 refills | Status: DC | PRN

## 2020-01-06 MED ORDER — SPIRONOLACTONE 25 MG PO TABS: 25.0000 mg | ORAL_TABLET | Freq: Every day | ORAL | 3 refills | Status: DC

## 2020-01-06 MED ORDER — TORSEMIDE 10 MG PO TABS: 10.0000 mg | ORAL_TABLET | ORAL | 3 refills | Status: DC

## 2020-01-06 MED ORDER — CARVEDILOL 6.25 MG PO TABS: 3.1250 mg | ORAL_TABLET | Freq: Two times a day (BID) | ORAL | 3 refills | Status: DC

## 2020-01-06 MED ORDER — EZETIMIBE 10 MG PO TABS: 10.0000 mg | ORAL_TABLET | Freq: Every day | ORAL | 3 refills | Status: DC

## 2020-01-06 MED ORDER — SACUBITRIL-VALSARTAN 24-26 MG PO TABS: 1.0000 | ORAL_TABLET | Freq: Two times a day (BID) | ORAL | 3 refills | Status: DC

## 2020-01-06 NOTE — Patient Instructions (Addendum)
Echo in 02/2020 for cardiomyopathy   Needs med refills  Medication Instructions:  No changes  If you need a refill on your cardiac medications before your next appointment, please call your pharmacy.    Lab work: No new labs needed   If you have labs (blood work) drawn today and your tests are completely normal, you will receive your results only by: Marland Kitchen MyChart Message (if you have MyChart) OR . A paper copy in the mail If you have any lab test that is abnormal or we need to change your treatment, we will call you to review the results.   Testing/Procedures: No new testing needed   Follow-Up: At [facility], you and your health needs are our priority.  As part of our continuing mission to provide you with exceptional heart care, we have created designated Provider Care Teams.  These Care Teams include your primary Cardiologist (physician) and Advanced Practice Providers (APPs -  Physician Assistants and Nurse Practitioners) who all work together to provide you with the care you need, when you need it.  . You will need a follow up appointment in 6 months  . Providers on your designated Care Team:   . Murray Hodgkins, NP . Christell Faith, PA-C . Marrianne Mood, PA-C  Any Other Special Instructions Will Be Listed Below (If Applicable).  COVID-19 Vaccine Information can be found at: ShippingScam.co.uk For questions related to vaccine distribution or appointments, please email vaccine@[email or web address] .[email or web address] or call 8622749240.

## 2020-01-08 ENCOUNTER — Ambulatory Visit (INDEPENDENT_AMBULATORY_CARE_PROVIDER_SITE_OTHER): Payer: Medicare Other | Admitting: Pulmonary Disease

## 2020-01-08 ENCOUNTER — Encounter: Payer: Self-pay | Admitting: Pulmonary Disease

## 2020-01-08 ENCOUNTER — Ambulatory Visit: Payer: Medicare Other | Admitting: Nurse Practitioner

## 2020-01-08 ENCOUNTER — Other Ambulatory Visit: Payer: Self-pay

## 2020-01-08 VITALS — BP 108/66 | HR 87 | Temp 97.5°F | Ht 65.0 in | Wt 182.8 lb

## 2020-01-08 DIAGNOSIS — D803 Selective deficiency of immunoglobulin G [IgG] subclasses: Secondary | ICD-10-CM

## 2020-01-08 DIAGNOSIS — J4520 Mild intermittent asthma, uncomplicated: Secondary | ICD-10-CM

## 2020-01-08 DIAGNOSIS — R0602 Shortness of breath: Secondary | ICD-10-CM

## 2020-01-08 DIAGNOSIS — I428 Other cardiomyopathies: Secondary | ICD-10-CM

## 2020-01-08 MED ORDER — SPIRIVA RESPIMAT 1.25 MCG/ACT IN AERS: 2.0000 | INHALATION_SPRAY | Freq: Every day | RESPIRATORY_TRACT | 0 refills | Status: DC

## 2020-01-08 NOTE — Patient Instructions (Signed)
Recommend Pepcid AC (over-the-counter) 1 tablet at bedtime for your reflux  You are given you a trial of Spiriva Respimat 1.25 mcg, 2 puffs once a day, please let us know how this works for you so we can send a prescription to your pharmacy  We will see you in follow-up in 4 months time call sooner should any new problems arise

## 2020-01-08 NOTE — Progress Notes (Signed)
Subjective:    Patient ID: Ashley [name], female    DOB: [birth year], 74 y.o.   MRN: [id number]  Chief Complaint  Patient presents with   Follow-up    sob with exertion, prod cough with light green mucus and occ wheezing with bending.     HPI Patient is a 74 year old very complex, lifelong never smoker presents for follow-up on chronic recurrent bronchitis and mild intermittent asthma.  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  She previously followed with Dr. Nicholos Johns and I assumed her care on November 2020 as he is no longer in the practice.  She has tried multiple inhalers with little results.  Her most recent pulmonary function test was performed in September 2018 and it was essentially normal with just very mild decrease in the FEV1/FVC ratio.  In particular: FEV1 2.29 L or 104% predicted, FVC 3.52 L or 123% of predicted, FEV1/FVC equals 65%.  There was no air trapping and no hyperinflation her volumes were totally normal.  Diffusion capacity was mildly reduced at 78%.  There has been mention of mild emphysema on a prior CT however this is not available for my review this is an outside film.  Prior CT abdomen that I was able to see from February 2019 shows mild scarring at the bases on the lung bases imaging.   She has significant nonischemic cardiomyopathy with LVEF of 30 to 35% and severe global hypokinesis and has a pacer defibrillator in place.  Has had some changes lately due to need for rewiring and change of pacer etc.  The patient has numerous medication intolerances.  These have been noted.   Currently she only uses Xopenex as needed she states it "calms her down".  She was prescribed QVAR by Dr. Nicholos Johns during her last visit with him, however she has found a reason why not to use it and states that it gave her "immediate thrush".  She states that she was rinsing well after use.   She does endorse issues with depression and  anxiety and states that lorazepam helps her with this.   She has not had any fevers, chills or sweats.  No hemoptysis.  She states that she gets about a pea-sized expectoration of sputum that is usually white occasionally light green and usually only in the mornings.  She has no other expectoration of sputum throughout the day.  She does have chronic orthopnea and does suffer from paroxysmal nocturnal dyspnea symptoms mostly related to her cardiac disease.  She has also noted reflux symptoms particularly at nighttime.  She was instructed on antireflux measures.  Data: **Echo 02/26/19 >> LVEF 30 to 35%, severe decrease in function, severe global hypokinesis **Cardiac catheterization 10/16/2018>> EF 20%, normal coronary arteries. **Echocardiogram 10/14/2018>> EF 50%.  Severe mitral valve regurgitation **Echo 08/07/16; EF=40% **CBC 10/27/16; Eos ranges from 100-300.  **Sleep study 12/05/2016; negative for sleep apnea, snoring with insomnia with poor sleep efficiency and frequent arousals. **CXR films 11/14/16; mild hyperinflation **PFTs 02/06/2017: FEV1 2.29 L or 104% predicted, FVC 3.52 L or 123% predicted, FEV1/FVC 65% normal lung volumes, diffusion capacity 78%.  Consistent with minimal obstructive defect.  Review of Systems A 10 point review of systems was performed and it is as noted above otherwise negative.  Current problem list, past medical and surgical history, medications and allergies were reviewed.    Objective:   Physical Exam BP 108/66 (BP Location: Left Arm, Cuff Size: Normal)   Pulse  87   Temp (!) 97.5 F (36.4 C) (Temporal)   Ht 5\' 5"  (1.651 m)   Wt 182 lb 12.8 oz (82.9 kg)   SpO2 96%   BMI 30.42 kg/m   SpO2: 96 % O2 Device: None (Room air)  General Appearance: No distress, overweight Neuro:without focal findings, mental status, speech normal, alert and oriented HEENT: PERRLA, EOM intact, sclera anicteric Pulmonary: No wheezing, No rales, excellent air  entry Cardiovascular: Normal S1,S2.  No m/r/g.  Pacer/defib on left Abdomen: Benign, Soft, non-tender, No masses Renal:  No costovertebral tenderness  Endo: No evident thyromegaly, no signs of acromegaly or Cushing features Skin:   warm, no rashes, no ecchymosis  Extremities: normal, no cyanosis, no clubbing.     Assessment & Plan:     ICD-10-CM   1. Mild intermittent asthma without complication  J45.20    Most bothersome symptom is intermittent cough Cites multiple medication intolerances Trial of Spiriva Respimat 1.25 mcg 2 puffs QD Continue PRN Xopenex    2. 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 Non-ischemic cardiomyopathy (HCC)  I42.8    This issue adds complexity to her management Main culprit on her symptoms of dyspnea     Meds ordered this encounter  Medications   Tiotropium Bromide Monohydrate (SPIRIVA RESPIMAT) 1.25 MCG/ACT AERS    Sig: Inhale 2 puffs into the lungs daily.    Dispense:  4 g    Refill:  0    Order Specific Question:   Lot Number?    Answer:   [id number]    Order Specific Question:   Expiration Date?    Answer:   08/13/2020    Order Specific Question:   Quantity    Answer:   2   Patient to follow-up in 4 months time she is to contact us prior to that time should any new difficulties arise.  Gailen Shelter, MD Advanced Bronchoscopy P[facility]    *This note was dictated using voice recognition software/Dragon.  Despite best efforts to proofread, errors can occur which can change the meaning. Any transcriptional errors that result from this process are unintentional and may not be fully corrected at the time of dictation.

## 2020-01-09 DIAGNOSIS — I5022 Chronic systolic (congestive) heart failure: Secondary | ICD-10-CM

## 2020-01-09 NOTE — Progress Notes (Signed)
Daily Session Note  Patient Details  Name: Ashley [name] MRN: [id number] Date of Birth: 1946/05/15 Referring Provider:     Cardiac Rehab from 10/01/2019 in [facility] Cardiac and Pulmonary Rehab  Referring Provider [name] MD      Encounter Date: 01/09/2020  Check In:  Session Check In - 01/09/20 1132      Check-In   Supervising physician immediately available to respond to emergencies See telemetry face sheet for immediately available ER MD    Location [facility]-Cardiac & Pulmonary Rehab    Staff Present Renita Papa, RN BSN;Laureen Owens Shark, BS, RRT, CPFT;Vida Rigger RN, BSN;Jessica Luan Pulling, MA, RCEP, CCRP, CCET    Virtual Visit No    Medication changes reported     No    Comments Started on Spiriva    Fall or balance concerns reported    No    Warm-up and Cool-down Performed on first and last piece of equipment    Resistance Training Performed Yes    VAD Patient? No    PAD/SET Patient? No      Pain Assessment   Currently in Pain? No/denies              Social History   Tobacco Use  Smoking Status Never Smoker  Smokeless Tobacco Never Used  Tobacco Comment   Lived with smokers x 23 yrs, smoked herself "for a week"    Goals Met:  Proper associated with RPD/PD & O2 Sat Independence with exercise equipment Exercise tolerated well No report of cardiac concerns or symptoms Strength training completed today  Goals Unmet:  Not Applicable  Comments: Pt able to follow exercise prescription today without complaint.  Will continue to monitor for progression.   Dr. Emily Filbert is Medical Director for [facility] and [facility].

## 2020-01-12 ENCOUNTER — Encounter: Payer: Medicare Other | Admitting: *Deleted

## 2020-01-12 ENCOUNTER — Other Ambulatory Visit: Payer: Self-pay

## 2020-01-12 VITALS — Ht 66.3 in | Wt 184.0 lb

## 2020-01-12 DIAGNOSIS — I5022 Chronic systolic (congestive) heart failure: Secondary | ICD-10-CM

## 2020-01-12 NOTE — Patient Instructions (Signed)
Discharge Patient Instructions  Patient Details  Name: Ashley [name] MRN: [id number] Date of Birth: 06/20/1945 Referring Provider:  Minna Merritts, MD   Number of Visits: 8  Reason for Discharge:  Patient reached a stable level of exercise.  Smoking History:  Social History   Tobacco Use  Smoking Status Never Smoker  Smokeless Tobacco Never Used  Tobacco Comment   Lived with smokers x 23 yrs, smoked herself "for a week"    Diagnosis:  Heart failure, chronic systolic (HCC)  Initial Exercise Prescription:  Initial Exercise Prescription - 10/01/19 1400      Date of Initial Exercise RX and Referring Provider   Date 10/01/19    Referring Provider [name] MD      Treadmill   MPH 1.5    Grade 0    Minutes 15    METs 2.15      Recumbant Bike   Level 1    Watts 12    Minutes 15    METs 2      Arm Ergometer   Level 1    Watts 25    RPM 25    Minutes 15    METs 2      REL-XR   Level 1    Speed 50    Minutes 15    METs 2      Prescription Details   Frequency (times per week) 3    Duration Progress to 30 minutes of continuous aerobic without signs/symptoms of physical distress      Intensity   THRR 40-80% of Max Heartrate 115-136    Ratings of Perceived Exertion 11-13    Perceived Dyspnea 0-4      Progression   Progression Continue to progress workloads to maintain intensity without signs/symptoms of physical distress.      Resistance Training   Training Prescription Yes    Weight 3 lb    Reps 10-15           Discharge Exercise Prescription (Final Exercise Prescription Changes):  Exercise Prescription Changes - 01/07/20 1200      Response to Exercise   Blood Pressure (Admit) 102/70    Blood Pressure (Exercise) 130/64    Blood Pressure (Exit) 114/64    Heart Rate (Admit) 92 bpm    Heart Rate (Exercise) 101 bpm    Heart Rate (Exit) 86 bpm    Rating of Perceived Exertion (Exercise) 12    Symptoms none    Duration Continue with  30 min of aerobic exercise without signs/symptoms of physical distress.    Intensity THRR unchanged      Progression   Progression Continue to progress workloads to maintain intensity without signs/symptoms of physical distress.    Average METs 2.3      Resistance Training   Training Prescription Yes    Weight 2 lb    Reps 10-15      Interval Training   Interval Training No      REL-XR   Level 3    Minutes 15    METs 2.3      Home Exercise Plan   Plans to continue exercise at Home (comment)   walking, staff videos   Frequency Add 2 additional days to program exercise sessions.    Initial Home Exercises Provided 12/10/19           Functional Capacity:  6 Minute Walk    Row Name 10/01/19 1434  6 Minute Walk   Phase Initial     Distance 870 feet     Walk Time 6 minutes     # of Rest Breaks 0     MPH 1.65     METS 2.12     RPE 13     Perceived Dyspnea  2     VO2 Peak 7.43     Symptoms Yes (comment)     Comments tired, SOB, shuffling gait (uses walking stick)     Resting HR 94 bpm     Resting BP 122/70     Resting Oxygen Saturation  96 %     Exercise Oxygen Saturation  during 6 min walk 98 %     Max Ex. HR 107 bpm     Max Ex. BP 156/70     2 Minute Post BP 134/70            Nutrition & Weight - Outcomes:  Pre Biometrics - 10/01/19 1437      Pre Biometrics   Height 5' 6.3" (1.684 m)    Weight 178 lb 9.6 oz (81 kg)    BMI (Calculated) 28.57    Single Leg Stand 5.66 seconds          Nutrition:  Nutrition Therapy & Goals - 10/15/19 1134      Nutrition Therapy   Diet heart healthy, low Na    Protein (specify units) 75-80g    Fiber 25 grams    Whole Grain Foods 3 servings    Saturated Fats 12 max. grams    Fruits and Vegetables 5 servings/day    Sodium 1.5 grams      Personal Nutrition Goals   Nutrition Goal ST: add protein to breakfast or snack (discussed nuts/seeds)LT: Increase EF - now 30%    Comments Pt reports getting sick every 6  months which lowers her EF - has chronic bronchitis. Pt gets SOB very easily- affects ADLs. cooking or shopping is harder for her at this time due to SOB and fatigue. Hardies - cheese biscuit (no butter and removed insides with 1 slice of cheese) with medium sweet tea. L: Fruit, ham, leftover salad S: salt free popcorn and fruit or an oatmeal raisin cookie D: two vegetables (zucchini, onion,  sweet potatoes, and carrots and green beans, yellow squash, avocado) and low salt meat (salmon or other fish), brown rice, low fat dairy. Sometimes ice cream. IBS - onions are trigger for her, will only have it sometimes. Pt has tried to remove cheese and dairy but it didn't seem to help. Doesn't cook with salt and is mindful about processed foods and salt. Pt doesn't like peanut butter. Discussed heart healthy eating as well as pulmonary MNT.      Intervention Plan   Intervention Prescribe, educate and counsel regarding individualized specific dietary modifications aiming towards targeted core components such as weight, hypertension, lipid management, diabetes, heart failure and other comorbidities.;Nutrition handout(s) given to patient.    Expected Outcomes Short Term Goal: Understand basic principles of dietary content, such as calories, fat, sodium, cholesterol and nutrients.;Short Term Goal: A plan has been developed with personal nutrition goals set during dietitian appointment.;Long Term Goal: Adherence to prescribed nutrition plan.          Goals reviewed with patient; copy given to patient.

## 2020-01-12 NOTE — Progress Notes (Signed)
Discharge Progress Report  Patient Details  Name: Ashley [name] MRN: [id number] Date of Birth: [birth year] Referring Provider:     Cardiac Rehab from 10/01/2019 in [facility] Cardiac and Pulmonary Rehab  Referring Provider [name] MD       Number of Visits: 31  Reason for Discharge:  Patient reached a stable level of exercise. Patient independent in their exercise. Patient has met program and personal goals.  Smoking History:  Social History   Tobacco Use  Smoking Status Never Smoker  Smokeless Tobacco Never Used  Tobacco Comment   Lived with smokers x 23 yrs, smoked herself "for a week"    Diagnosis:  Heart failure, chronic systolic ([place])  ADL [facility]SD:   Initial Exercise Prescription:  Initial Exercise Prescription - 10/01/19 1400      Date of Initial Exercise RX and Referring Provider   Date 10/01/19    Referring Provider [name] MD      Treadmill   MPH 1.5    Grade 0    Minutes 15    METs 2.15      Recumbant Bike   Level 1    Watts 12    Minutes 15    METs 2      Arm Ergometer   Level 1    Watts 25    RPM 25    Minutes 15    METs 2      REL-XR   Level 1    Speed 50    Minutes 15    METs 2      Prescription Details   Frequency (times per week) 3    Duration Progress to 30 minutes of continuous aerobic without signs/symptoms of physical distress      Intensity   THRR 40-80% of Max Heartrate 115-136    Ratings of Perceived Exertion 11-13    Perceived Dyspnea 0-4      Progression   Progression Continue to progress workloads to maintain intensity without signs/symptoms of physical distress.      Resistance Training   Training Prescription Yes    Weight 3 lb    Reps 10-15           Discharge Exercise Prescription (Final Exercise Prescription Changes):  Exercise Prescription Changes - 01/07/20 1200      Response to Exercise   Blood Pressure (Admit) 102/70    Blood Pressure (Exercise) 130/64    Blood Pressure (Exit)  114/64    Heart Rate (Admit) 92 bpm    Heart Rate (Exercise) 101 bpm    Heart Rate (Exit) 86 bpm    Rating of Perceived Exertion (Exercise) 12    Symptoms none    Duration Continue with 30 min of aerobic exercise without signs/symptoms of physical distress.    Intensity THRR unchanged      Progression   Progression Continue to progress workloads to maintain intensity without signs/symptoms of physical distress.    Average METs 2.3      Resistance Training   Training Prescription Yes    Weight 2 lb    Reps 10-15      Interval Training   Interval Training No      REL-XR   Level 3    Minutes 15    METs 2.3      Home Exercise Plan   Plans to continue exercise at Home (comment)   walking, staff videos   Frequency Add 2 additional days to program exercise sessions.    Initial  Home Exercises Provided 12/10/19           Functional Capacity:  6 Minute Walk    Row Name 10/01/19 1434         6 Minute Walk   Phase Initial     Distance 870 feet     Walk Time 6 minutes     # of Rest Breaks 0     MPH 1.65     METS 2.12     RPE 13     Perceived Dyspnea  2     VO2 Peak 7.43     Symptoms Yes (comment)     Comments tired, SOB, shuffling gait (uses walking stick)     Resting HR 94 bpm     Resting BP 122/70     Resting Oxygen Saturation  96 %     Exercise Oxygen Saturation  during 6 min walk 98 %     Max Ex. HR 107 bpm     Max Ex. BP 156/70     2 Minute Post BP 134/70            Psychological, QOL, Others - Outcomes: PHQ 2/9: Depression screen [facility] 2/9 01/12/2020 12/29/2019 12/01/2019 12/01/2019 11/05/2019  Decreased Interest 1 1 0 1 0  Down, Depressed, Hopeless 2 2 0 1 1  PHQ - 2 Score 3 3 0 2 1  Altered sleeping 3 3 0 3 3  Tired, decreased energy 3 3 0 3 3  Change in appetite 2 2 0 1 1  Feeling bad or failure about yourself  0 0 0 0 0  Trouble concentrating 1 1 0 1 1  Moving slowly or fidgety/restless 0 0 0 0 0  Suicidal thoughts 0 0 0 0 0  PHQ-9 Score 12 12 0 10  9  Difficult doing work/chores Somewhat difficult Somewhat difficult Not difficult at all Somewhat difficult Somewhat difficult  Some recent data might be hidden    Quality of Life:  Quality of Life - 01/12/20 1115      Quality of Life Scores   Health/Function Pre 8.07 %    Health/Function Post 12 %    Health/Function % Change 48.7 %    Socioeconomic Pre 21.57 %    Socioeconomic Post 24.21 %    Socioeconomic % Change  12.24 %    Psych/Spiritual Pre 17.79 %    Psych/Spiritual Post 17.79 %    Psych/Spiritual % Change 0 %    Family Pre 18 %    Family Post 19.2 %    Family % Change 6.67 %    GLOBAL Pre 14.37 %    GLOBAL Post 16.91 %    GLOBAL % Change 17.68 %            Nutrition & Weight - Outcomes:  Pre Biometrics - 10/01/19 1437      Pre Biometrics   Height 5' 6.3" (1.684 m)    Weight 178 lb 9.6 oz (81 kg)    BMI (Calculated) 28.57    Single Leg Stand 5.66 seconds            Nutrition:  Nutrition Therapy & Goals - 10/15/19 1134      Nutrition Therapy   Diet heart healthy, low Na    Protein (specify units) 75-80g    Fiber 25 grams    Whole Grain Foods 3 servings    Saturated Fats 12 max. grams    Fruits and Vegetables 5 servings/day  Sodium 1.5 grams      Personal Nutrition Goals   Nutrition Goal ST: add protein to breakfast or snack (discussed nuts/seeds)LT: Increase EF - now 30%    Comments Pt reports getting sick every 6 months which lowers her EF - has chronic bronchitis. Pt gets SOB very easily- affects ADLs. cooking or shopping is harder for her at this time due to SOB and fatigue. Hardies - cheese biscuit (no butter and removed insides with 1 slice of cheese) with medium sweet tea. L: Fruit, ham, leftover salad S: salt free popcorn and fruit or an oatmeal raisin cookie D: two vegetables (zucchini, onion,  sweet potatoes, and carrots and green beans, yellow squash, avocado) and low salt meat (salmon or other fish), brown rice, low fat dairy. Sometimes  ice cream. IBS - onions are trigger for her, will only have it sometimes. Pt has tried to remove cheese and dairy but it didn't seem to help. Doesn't cook with salt and is mindful about processed foods and salt. Pt doesn't like peanut butter. Discussed heart healthy eating as well as pulmonary MNT.      Intervention Plan   Intervention Prescribe, educate and counsel regarding individualized specific dietary modifications aiming towards targeted core components such as weight, hypertension, lipid management, diabetes, heart failure and other comorbidities.;Nutrition handout(s) given to patient.    Expected Outcomes Short Term Goal: Understand basic principles of dietary content, such as calories, fat, sodium, cholesterol and nutrients.;Short Term Goal: A plan has been developed with personal nutrition goals set during dietitian appointment.;Long Term Goal: Adherence to prescribed nutrition plan.           Nutrition Discharge:  Nutrition Assessments - 10/01/19 1438      MEDFICTS Scores   Pre Score 48           Education Questionnaire Score:  Knowledge Questionnaire Score - 01/12/20 1117      Knowledge Questionnaire Score   Post Score 22/26           Goals reviewed with patient; copy given to patient.

## 2020-01-12 NOTE — Progress Notes (Signed)
Daily Session Note  Patient Details  Name: Ashley [name] MRN: [id number] Date of Birth: 01/30/1946 Referring Provider:     Cardiac Rehab from 10/01/2019 in [facility] Cardiac and Pulmonary Rehab  Referring Provider Gollan, Timothy MD      Encounter Date: 01/12/2020  Check In:  Session Check In - 01/12/20 1110      Check-In   Supervising physician immediately available to respond to emergencies See telemetry face sheet for immediately available ER MD    Location [facility]-Cardiac & Pulmonary Rehab    Staff Present  , RN BSN;Joseph Hood RCP,RRT,BSRT;Kara Langdon, MS Exercise Physiologist;Kelly Hayes, BS, ACSM CEP, Exercise Physiologist    Virtual Visit No    Medication changes reported     No    Fall or balance concerns reported    No    Warm-up and Cool-down Performed on first and last piece of equipment    Resistance Training Performed Yes    VAD Patient? No    PAD/SET Patient? No      Pain Assessment   Currently in Pain? No/denies              Social History   Tobacco Use  Smoking Status Never Smoker  Smokeless Tobacco Never Used  Tobacco Comment   Lived with smokers x 23 yrs, smoked herself "for a week"    Goals Met:  Independence with exercise equipment Exercise tolerated well No report of cardiac concerns or symptoms Strength training completed today  Goals Unmet:  Not Applicable  Comments:  Ashley [name] graduated today from  rehab with 31 sessions completed.  Details of the patient's exercise prescription and what She needs to do in order to continue the prescription and progress were discussed with patient.  Patient was given a copy of prescription and goals.  Patient verbalized understanding.  Ashley [name] plans to continue to exercise by walking at home.  6 Minute Walk    Row Name 10/01/19 1434 01/12/20 1130       6 Minute Walk   Phase Initial Discharge    Distance 870 feet 910 feet    Distance % Change -- 5 %    Distance Feet Change -- 40 ft     Walk Time 6 minutes 6 minutes    # of Rest Breaks 0 0    MPH 1.65 1.7    METS 2.12 1.8    RPE 13 12    Perceived Dyspnea  2 --    VO2 Peak 7.43 6.39    Symptoms Yes (comment) No    Comments tired, SOB, shuffling gait (uses walking stick) --    Resting HR 94 bpm 69 bpm    Resting BP 122/70 110/60    Resting Oxygen Saturation  96 % --    Exercise Oxygen Saturation  during 6 min walk 98 % --    Max Ex. HR 107 bpm 111 bpm    Max Ex. BP 156/70 112/60    2 Minute Post BP 134/70 --            Dr. Mark Miller is Medical Director for [facility] and [facility]. 

## 2020-01-12 NOTE — Progress Notes (Signed)
Cardiac Individual Treatment Plan  Patient Details  Name: Ashley [name] MRN: [id number] Date of Birth: [birth year] Referring Provider:     Cardiac Rehab from 10/01/2019 in [facility] Cardiac and Pulmonary Rehab  Referring Provider [name] MD      Initial Encounter Date:    Cardiac Rehab from 10/01/2019 in [facility] Cardiac and Pulmonary Rehab  Date 10/01/19      Visit Diagnosis: Heart failure, chronic systolic ([place])  Patient's Home Medications on Admission:  Current Outpatient Medications:  .  acetaminophen (TYLENOL) 500 MG tablet, Take 500 mg by mouth daily as needed for moderate pain or headache (PAIN). , Disp: , Rfl:  .  AMBULATORY NON FORMULARY MEDICATION, Medication Name:  1 squatty potty to use as needed, Disp: 1 each, Rfl: 0 .  Ascorbic Acid (VITAMIN C) 1000 MG tablet, Take 1,000 mg by mouth See admin instructions. Take 1000 mg daily, may take an additional 1000 mg as needed for cold symptoms, Disp: , Rfl:  .  aspirin EC 81 MG tablet, Take 81 mg by mouth at bedtime. , Disp: , Rfl:  .  benzonatate (TESSALON) 200 MG capsule, Take 1 capsule (200 mg total) by mouth 3 (three) times daily as needed for cough., Disp: 30 capsule, Rfl: 3 .  carvedilol (COREG) 6.25 MG tablet, Take 0.5 tablets (3.125 mg total) by mouth 2 (two) times daily with a meal., Disp: 45 tablet, Rfl: 3 .  cholecalciferol (VITAMIN D) 1000 units tablet, Take 1,000 Units by mouth daily. , Disp: , Rfl:  .  clotrimazole (LOTRIMIN) 1 % cream, Apply 1 application topically 2 (two) times daily., Disp: 60 g, Rfl: 1 .  Coenzyme Q10 (COQ-10) 100 MG CAPS, Take 100 mg by mouth daily., Disp: , Rfl:  .  dextromethorphan (DELSYM) 30 MG/5ML liquid, Take 15 mg by mouth daily as needed for cough. , Disp: , Rfl:  .  diclofenac Sodium (VOLTAREN) 1 % GEL, Apply 1 application topically 4 (four) times daily., Disp: , Rfl:  .  ezetimibe (ZETIA) 10 MG tablet, Take 1 tablet (10 mg total) by mouth daily., Disp: 90 tablet, Rfl: 3 .  GARLIC PO,  Take 1 capsule by mouth daily., Disp: , Rfl:  .  guaiFENesin (MUCINEX) 600 MG 12 hr tablet, Take 600 mg by mouth daily., Disp: , Rfl:  .  levalbuterol (XOPENEX HFA) 45 MCG/ACT inhaler, Inhale 2 puffs into the lungs every 4 (four) hours as needed for wheezing or shortness of breath. (Patient taking differently: Inhale 2 puffs into the lungs every 4 (four) hours as needed for wheezing or shortness of breath. ), Disp: 1 Inhaler, Rfl: 3 .  LORazepam (ATIVAN) 0.5 MG tablet, Take 0.5-1 tablets (0.25-0.5 mg total) by mouth at bedtime as needed for anxiety or sleep., Disp: 30 tablet, Rfl: 1 .  Menthol, Topical Analgesic, (BIOFREEZE ROLL-ON EX), Apply topically., Disp: , Rfl:  .  miconazole (MICOTIN) 2 % powder, Apply 1 application topically as needed (yeast)., Disp: , Rfl:  .  nystatin (MYCOSTATIN) 100000 UNIT/ML suspension, Take 5 mLs (500,000 Units total) by mouth 3 (three) times daily., Disp: 120 mL, Rfl: 0 .  nystatin cream (MYCOSTATIN), Apply 1 application topically as needed for dry skin (yeast infection)., Disp: 30 g, Rfl: 3 .  potassium chloride (KLOR-CON) 10 MEQ tablet, Take 1 tablet (10 mEq total) by mouth daily as needed (with laxis)., Disp: 90 tablet, Rfl: 3 .  pyridOXINE (VITAMIN B-6) 100 MG tablet, Take 100 mg by mouth daily., Disp: , Rfl:  .  Red Yeast Rice Extract (RED YEAST RICE PO), Take 1 capsule by mouth in the morning and at bedtime. , Disp: , Rfl:  .  sacubitril-valsartan (ENTRESTO) 24-26 MG, Take 1 tablet by mouth 2 (two) times daily., Disp: 180 tablet, Rfl: 3 .  spironolactone (ALDACTONE) 25 MG tablet, Take 1 tablet (25 mg total) by mouth daily., Disp: 90 tablet, Rfl: 3 .  Tiotropium Bromide Monohydrate (SPIRIVA RESPIMAT) 1.25 MCG/ACT AERS, Inhale 2 puffs into the lungs daily., Disp: 4 g, Rfl: 0 .  torsemide (DEMADEX) 10 MG tablet, Take 1 tablet (10 mg total) by mouth every other day., Disp: 45 tablet, Rfl: 3 .  triamcinolone (NASACORT) 55 MCG/ACT AERO nasal inhaler, Place 2 sprays  into the nose daily., Disp: , Rfl:  .  vitamin E 400 UNIT capsule, Take 400 Units by mouth daily., Disp: , Rfl:   Past Medical History: Past Medical History:  Diagnosis Date  . Abdominal aortic atherosclerosis ([place]) 11/2015   by CT  . Allergy   . Anemia   . Anxiety   . Automatic implantable cardioverter-defibrillator in situ 2012   a. MDT single lead ICD.  Marland Kitchen Bronchiectasis    a. possibly mild, treat URIs aggressively  . Cancer (HCC)    basal cell, arms & neck  . CHF (congestive heart failure) ([place])   . Chronic sinusitis   . COPD (chronic obstructive pulmonary disease) ([place])   . Depression   . Dyspnea   . Essential hypertension   . Fibrocystic breast disease   . Fibromyalgia   . GERD (gastroesophageal reflux disease)   . H/O multiple allergies 10/10/2013  . Hemorrhoids   . History of diverticulitis of colon   . History of pneumonia 2012  . Insomnia   . Irritable bowel syndrome   . Laryngeal nodule    Dr. Thomasena Edis  . Migraine without aura, without mention of intractable migraine without mention of status migrainosus   . Mitral regurgitation   . Nonischemic cardiomyopathy ([place])    a. Recovered LV fxn - prev as low as 25%, now 50-55% by echo 04/2014;  b. 2012 s/p MDT [id number] [place] VR single lead ICD.  Marland Kitchen Osteoarthritis    knees, back, hands, low back,   . Osteoporosis 01/2013   T-2.7 spine (01/2016)  . PONV (postoperative nausea and vomiting)   . sigmoid diverticulitis with perforation, abscess and fistula 09/30/2013  . Sleep apnea 2010   borderline, inconclusive- /w [place]     Tobacco Use: Social History   Tobacco Use  Smoking Status Never Smoker  Smokeless Tobacco Never Used  Tobacco Comment   Lived with smokers x 23 yrs, smoked herself "for a week"    Labs: Recent Review Flowsheet Data    Labs for ITP Cardiac and Pulmonary Rehab Latest Ref Rng & Units 10/16/2018 10/16/2018 11/27/2018 03/18/2019 12/01/2019   Cholestrol 0 - 200 mg/dL - - 231(H) 217(H) 232(H)    LDLCALC 0 - 99 mg/dL - - - 131(H) 149(H)   LDLDIRECT mg/dL - - 164.0 - -   HDL >39.00 mg/dL - - 45.20 46.60 47.70   Trlycerides 0 - 149 mg/dL - - 264.0(H) 199.0(H) 172.0(H)   Hemoglobin A1c 4.6 - 6.5 % - - 5.9 - 5.9   PHART 7.35 - 7.45 7.447 - - - -   PCO2ART 32 - 48 mmHg 40.1 - - - -   HCO3 20.0 - 28.0 mmol/L 27.6 26.2 - - -   TCO2 22 - 32 mmol/L 29 27 - - -  O2SAT % 95.0 63.0 - - -       Exercise Target Goals: Exercise Program Goal: Individual exercise prescription set using results from initial 6 min walk test and THRR while considering  patient's activity barriers and safety.   Exercise Prescription Goal: Initial exercise prescription builds to 30-45 minutes a day of aerobic activity, 2-3 days per week.  Home exercise guidelines will be given to patient during program as part of exercise prescription that the participant will acknowledge.   Education: Aerobic Exercise & Resistance Training: - Gives group verbal and written instruction on the various components of exercise. Focuses on aerobic and resistive training programs and the benefits of this training and how to safely progress through these programs..   Education: Exercise & Equipment Safety: - Individual verbal instruction and demonstration of equipment use and safety with use of the equipment.   Cardiac Rehab from 12/17/2019 in [facility] Cardiac and Pulmonary Rehab  Date 10/01/19  Educator [facility]  Instruction Review Code 1- Verbalizes Understanding      Education: Exercise Physiology & General Exercise Guidelines: - Group verbal and written instruction with models to review the exercise physiology of the cardiovascular system and associated critical values. Provides general exercise guidelines with specific guidelines to those with heart or lung disease.    Cardiac Rehab from 12/17/2019 in [facility] Cardiac and Pulmonary Rehab  Date 11/26/19  Educator jh  Instruction Review Code 1- Verbalizes Understanding      Education:  Flexibility, Balance, Mind/Body Relaxation: Provides group verbal/written instruction on the benefits of flexibility and balance training, including mind/body exercise modes such as yoga, pilates and tai chi.  Demonstration and skill practice provided.   Activity Barriers & Risk Stratification:  Activity Barriers & Cardiac Risk Stratification - 09/30/19 1417      Activity Barriers & Cardiac Risk Stratification   Activity Barriers Arthritis;Muscular Weakness;Deconditioning;Shortness of Breath;Decreased Ventricular Function;Other (comment);Neck/Spine Problems;Back Problems;Joint Problems;Balance Concerns;History of Falls    Comments neuropathy ins hands and feet, knees can be bothersome, arthritis in neck, calified disc, IBS issues, bending over causes SOB and fatigue    Cardiac Risk Stratification High           6 Minute Walk:  6 Minute Walk    Row Name 10/01/19 1434 01/12/20 1130       6 Minute Walk   Phase Initial Discharge    Distance 870 feet 910 feet    Distance % Change -- 5 %    Distance Feet Change -- 40 ft    Walk Time 6 minutes 6 minutes    # of Rest Breaks 0 0    MPH 1.65 1.7    METS 2.12 1.8    RPE 13 12    Perceived Dyspnea  2 --    VO2 Peak 7.43 6.39    Symptoms Yes (comment) No    Comments tired, SOB, shuffling gait (uses walking stick) --    Resting HR 94 bpm 69 bpm    Resting BP 122/70 110/60    Resting Oxygen Saturation  96 % --    Exercise Oxygen Saturation  during 6 min walk 98 % --    Max Ex. HR 107 bpm 111 bpm    Max Ex. BP 156/70 112/60    2 Minute Post BP 134/70 --           Oxygen Initial Assessment:   Oxygen Re-Evaluation:   Oxygen Discharge (Final Oxygen Re-Evaluation):   Initial Exercise Prescription:  Initial Exercise Prescription -  10/01/19 1400      Date of Initial Exercise RX and Referring Provider   Date 10/01/19    Referring Provider [name] MD      Treadmill   MPH 1.5    Grade 0    Minutes 15    METs 2.15       Recumbant Bike   Level 1    Watts 12    Minutes 15    METs 2      Arm Ergometer   Level 1    Watts 25    RPM 25    Minutes 15    METs 2      REL-XR   Level 1    Speed 50    Minutes 15    METs 2      Prescription Details   Frequency (times per week) 3    Duration Progress to 30 minutes of continuous aerobic without signs/symptoms of physical distress      Intensity   THRR 40-80% of Max Heartrate 115-136    Ratings of Perceived Exertion 11-13    Perceived Dyspnea 0-4      Progression   Progression Continue to progress workloads to maintain intensity without signs/symptoms of physical distress.      Resistance Training   Training Prescription Yes    Weight 3 lb    Reps 10-15           Perform Capillary Blood Glucose checks as needed.  Exercise Prescription Changes:   Exercise Prescription Changes    Row Name 10/01/19 1400 10/14/19 1600 11/11/19 1500 11/25/19 1400 12/09/19 1400     Response to Exercise   Blood Pressure (Admit) 122/70 110/68 104/60 120/66 124/64   Blood Pressure (Exercise) 156/70 128/56 118/66 122/80 110/68   Blood Pressure (Exit) 134/70 126/70 112/62 108/64 92/60   Heart Rate (Admit) 94 bpm 89 bpm 106 bpm 71 bpm 76 bpm   Heart Rate (Exercise) 107 bpm 109 bpm 103 bpm 91 bpm 107 bpm   Heart Rate (Exit) 97 bpm 86 bpm 89 bpm 80 bpm 97 bpm   Oxygen Saturation (Admit) 96 % -- -- -- --   Oxygen Saturation (Exercise) 98 % -- -- -- --   Rating of Perceived Exertion (Exercise) _0 Perceived Dyspnea (Exercise) 2 -- -- -- --   Symptoms tired, SOB, shuffling gait, using walking stick none none none none   Comments walk test results third full day of exercise  -- -- --   Duration -- Continue with 30 min of aerobic exercise without signs/symptoms of physical distress. Continue with 30 min of aerobic exercise without signs/symptoms of physical distress. Continue with 30 min of aerobic exercise without signs/symptoms of physical distress.  Continue with 30 min of aerobic exercise without signs/symptoms of physical distress.   Intensity -- THRR unchanged THRR unchanged THRR unchanged THRR unchanged     Progression   Progression -- Continue to progress workloads to maintain intensity without signs/symptoms of physical distress. Continue to progress workloads to maintain intensity without signs/symptoms of physical distress. Continue to progress workloads to maintain intensity without signs/symptoms of physical distress. Continue to progress workloads to maintain intensity without signs/symptoms of physical distress.   Average METs -- 2.13 3.2 2.75 2.8     Resistance Training   Training Prescription -- Yes Yes Yes Yes   Weight -- 2 lb 2 lb 2 lb 2 lb   Reps -- 10-15 10-15 10-15 10-15  Interval Training   Interval Training -- No No No No     Treadmill   MPH -- 1.5 -- -- --   Grade -- 0 -- -- --   Minutes -- 15 -- -- --   METs -- 2.15 -- -- --     Recumbant Bike   Level -- 1 -- -- --   Watts -- 15 -- -- --   Minutes -- 15 -- -- --   METs -- 2.15 -- -- --     Arm Ergometer   Level -- 1 -- -- --   Minutes -- 15 -- -- --   METs -- 2.1 -- -- --     REL-XR   Level -- _0 Speed -- -- -- 80 --   Minutes -- _1 METs -- -- 3.9 2.8 2.8   Row Name 12/10/19 1100 12/23/19 1400 01/07/20 1200         Response to Exercise   Blood Pressure (Admit) -- 120/68 102/70     Blood Pressure (Exercise) -- 110/64 130/64     Blood Pressure (Exit) -- 106/64 114/64     Heart Rate (Admit) -- 68 bpm 92 bpm     Heart Rate (Exercise) -- 110 bpm 101 bpm     Heart Rate (Exit) -- 65 bpm 86 bpm     Rating of Perceived Exertion (Exercise) -- 14 12     Symptoms -- none none     Duration -- Continue with 30 min of aerobic exercise without signs/symptoms of physical distress. Continue with 30 min of aerobic exercise without signs/symptoms of physical distress.     Intensity -- THRR unchanged THRR unchanged       Progression    Progression -- Continue to progress workloads to maintain intensity without signs/symptoms of physical distress. Continue to progress workloads to maintain intensity without signs/symptoms of physical distress.     Average METs -- 2.3 2.3       Resistance Training   Training Prescription -- Yes Yes     Weight -- 2 lb 2 lb     Reps -- 10-15 10-15       Interval Training   Interval Training -- No No       REL-XR   Level -- 3 3     Speed -- 50 --     Minutes -- 15 15     METs -- 2.3 2.3       Home Exercise Plan   Plans to continue exercise at Home (comment)  walking, staff videos Home (comment)  walking, staff videos Home (comment)  walking, staff videos     Frequency Add 2 additional days to program exercise sessions. Add 2 additional days to program exercise sessions. Add 2 additional days to program exercise sessions.     Initial Home Exercises Provided 12/10/19 12/10/19 12/10/19            Exercise Comments:   Exercise Goals and Review:   Exercise Goals    Row Name 10/01/19 1437             Exercise Goals   Increase Physical Activity Yes       Intervention Provide advice, education, support and counseling about physical activity/exercise needs.;Develop an individualized exercise prescription for aerobic and resistive training based on initial evaluation findings, risk stratification, comorbidities and participant's personal goals.       Expected Outcomes Short Term:  Attend rehab on a regular basis to increase amount of physical activity.;Long Term: Add in home exercise to make exercise part of routine and to increase amount of physical activity.;Long Term: Exercising regularly at least 3-5 days a week.       Increase Strength and Stamina Yes       Intervention Provide advice, education, support and counseling about physical activity/exercise needs.;Develop an individualized exercise prescription for aerobic and resistive training based on initial evaluation findings, risk  stratification, comorbidities and participant's personal goals.       Expected Outcomes Short Term: Perform resistance training exercises routinely during rehab and add in resistance training at home;Short Term: Increase workloads from initial exercise prescription for resistance, speed, and METs.;Long Term: Improve cardiorespiratory fitness, muscular endurance and strength as measured by increased METs and functional capacity (6MWT)       Able to understand and use rate of perceived exertion (RPE) scale Yes       Intervention Provide education and explanation on how to use RPE scale       Expected Outcomes Short Term: Able to use RPE daily in rehab to express subjective intensity level;Long Term:  Able to use RPE to guide intensity level when exercising independently       Able to understand and use Dyspnea scale Yes       Intervention Provide education and explanation on how to use Dyspnea scale       Expected Outcomes Short Term: Able to use Dyspnea scale daily in rehab to express subjective sense of shortness of breath during exertion;Long Term: Able to use Dyspnea scale to guide intensity level when exercising independently       Knowledge and understanding of Target Heart Rate Range (THRR) Yes       Intervention Provide education and explanation of THRR including how the numbers were predicted and where they are located for reference       Expected Outcomes Short Term: Able to state/look up THRR;Short Term: Able to use daily as guideline for intensity in rehab;Long Term: Able to use THRR to govern intensity when exercising independently       Able to check pulse independently Yes       Intervention Provide education and demonstration on how to check pulse in carotid and radial arteries.;Review the importance of being able to check your own pulse for safety during independent exercise       Expected Outcomes Short Term: Able to explain why pulse checking is important during independent  exercise;Long Term: Able to check pulse independently and accurately       Understanding of Exercise Prescription Yes       Intervention Provide education, explanation, and written materials on patient's individual exercise prescription       Expected Outcomes Short Term: Able to explain program exercise prescription;Long Term: Able to explain home exercise prescription to exercise independently              Exercise Goals Re-Evaluation :  Exercise Goals Re-Evaluation    [place] Name 10/03/19 1113 10/14/19 1605 11/11/19 1524 11/12/19 1138 11/25/19 1420     Exercise Goal Re-Evaluation   Exercise Goals Review Increase Physical Activity;Able to understand and use rate of perceived exertion (RPE) scale;Knowledge and understanding of Target Heart Rate Range (THRR);Understanding of Exercise Prescription;Increase Strength and Stamina;Able to check pulse independently Increase Physical Activity;Increase Strength and Stamina;Understanding of Exercise Prescription Increase Physical Activity;Increase Strength and Stamina;Understanding of Exercise Prescription Increase Physical Activity;Increase Strength and Stamina;Understanding of  Exercise Prescription Increase Physical Activity;Increase Strength and Stamina;Understanding of Exercise Prescription   Comments Reviewed RPE and dyspnea scales, THR and program prescription with pt today.  Pt voiced understanding and was given a copy of goals to take home. Ashley [name] is off to a good start in rehab.  She has completed her first three sessions thus far.  We will continue to encourage good attendance.  We will continue to monitor her progress. Ashley [name] has been doing well in rehab.  She has requested that we move her to just the XR as that was the only piece that did not cause her to hurt.  She is up to level 2 on there now.  We will continue to monitor her progress. 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  She uses Biofreeze and has had  pain injections for her neck.  She cant have the nerve block due to heart issues.  She does feel ok doing XR 3 times per week.  We discussed Arthritis exercise class at [facility] as an option when she completes HT. Ashley [name] has increased to level 3 on the XR.  She attends consistently and works at [employer].  Staff will monitor progress.   Expected Outcomes Short: Use RPE daily to regulate intensity. Long: Follow program prescription in THR. Short: Continue to attend regularly Long: Continue to follow program prescription. Short: Try level 3 on XR Long: Continue to attend regularly. Short: continue to exercise as tolerated  Long: build stamina Short:  continue to attend consistently Long:  increase stamina and MET level   Row Name 12/09/19 1423 12/10/19 1117 12/23/19 1408 01/07/20 1204 01/09/20 1125     Exercise Goal Re-Evaluation   Exercise Goals Review Increase Physical Activity;Increase Strength and Stamina;Understanding of Exercise Prescription Increase Physical Activity;Increase Strength and Stamina;Understanding of Exercise Prescription Increase Physical Activity;Increase Strength and Stamina;Understanding of Exercise Prescription Increase Physical Activity;Increase Strength and Stamina;Understanding of Exercise Prescription Increase Physical Activity;Increase Strength and Stamina;Understanding of Exercise Prescription   Comments Ashley [name] has been doing well in rehab.  She continues with level 3 on the XR as when she tried level 4 it started to aggravate her back.  We will continue to monitor her progress. Ashley [name] is doing well in rehab.  She has not really noticed much difference in her strength and stamina.  She stays busy keeping up with house and her garden but does not exercise as those things make her exhausted.  Reviewed home exercise with pt today.  Pt plans to walk and try staff videos for exercise.  Reviewed THR, pulse, RPE, sign and symptoms, pulse oximetery and when to call 911 or MD.   Also discussed weather considerations and indoor options.  Pt voiced understanding. Ashley [name] attends consistently and does as much as she is able.  Staff will monitor progress. Ashley [name] has been doing well in rehab.  She continues to do her 30 min on the XR.  She is nearing graduation and should improve her post 6MWT.  We will continue to monitor her progress. Ashley [name] has been doing well in rehab.  She does not feel that she is getting much out of it.  She is not seeing progress in her exercise.  She is also not exercising at home.   Expected Outcomes Short: Continue work hard while here  Long: Continue to improve stamina Short: Start to add in exercise at home Long; Improve stamina. Short:  continue to exercise consistently Long: increase stamina  Short: Improve post 6MWT  Long: Continue to improve stamina. Short: Improve post 6MWT  Long: Continue to improve stamina.          Discharge Exercise Prescription (Final Exercise Prescription Changes):  Exercise Prescription Changes - 01/07/20 1200      Response to Exercise   Blood Pressure (Admit) 102/70    Blood Pressure (Exercise) 130/64    Blood Pressure (Exit) 114/64    Heart Rate (Admit) 92 bpm    Heart Rate (Exercise) 101 bpm    Heart Rate (Exit) 86 bpm    Rating of Perceived Exertion (Exercise) 12    Symptoms none    Duration Continue with 30 min of aerobic exercise without signs/symptoms of physical distress.    Intensity THRR unchanged      Progression   Progression Continue to progress workloads to maintain intensity without signs/symptoms of physical distress.    Average METs 2.3      Resistance Training   Training Prescription Yes    Weight 2 lb    Reps 10-15      Interval Training   Interval Training No      REL-XR   Level 3    Minutes 15    METs 2.3      Home Exercise Plan   Plans to continue exercise at Home (comment)   walking, staff videos   Frequency Add 2 additional days to program exercise sessions.    Initial Home  Exercises Provided 12/10/19           Nutrition:  Target Goals: Understanding of nutrition guidelines, daily intake of sodium <1541m, cholesterol <2062m calories 30% from fat and 7% or less from saturated fats, daily to have 5 or more servings of fruits and vegetables.  Education: Controlling Sodium/Reading Food Labels -Group verbal and written material supporting the discussion of sodium use in heart healthy nutrition. Review and explanation with models, verbal and written materials for utilization of the food label.   Education: General Nutrition Guidelines/Fats and Fiber: -Group instruction provided by verbal, written material, models and posters to present the general guidelines for heart healthy nutrition. Gives an explanation and review of dietary fats and fiber.   Biometrics:  Pre Biometrics - 10/01/19 1437      Pre Biometrics   Height 5' 6.3" (1.684 m)    Weight 178 lb 9.6 oz (81 kg)    BMI (Calculated) 28.57    Single Leg Stand 5.66 seconds           Post Biometrics - 01/12/20 1134       Post  Biometrics   Height 5' 6.3" (1.684 m)    Weight 184 lb (83.5 kg)    BMI (Calculated) 29.43    Single Leg Stand 7.52 seconds           Nutrition Therapy Plan and Nutrition Goals:  Nutrition Therapy & Goals - 10/15/19 1134      Nutrition Therapy   Diet heart healthy, low Na    Protein (specify units) 75-80g    Fiber 25 grams    Whole Grain Foods 3 servings    Saturated Fats 12 max. grams    Fruits and Vegetables 5 servings/day    Sodium 1.5 grams      Personal Nutrition Goals   Nutrition Goal ST: add protein to breakfast or snack (discussed nuts/seeds)LT: Increase EF - now 30%    Comments Pt reports getting sick every 6 months which lowers her EF - has chronic  bronchitis. Pt gets SOB very easily- affects ADLs. cooking or shopping is harder for her at this time due to SOB and fatigue. [place] - cheese biscuit (no butter and removed insides with 1 slice of cheese)  with medium sweet tea. L: Fruit, ham, leftover salad S: salt free popcorn and fruit or an oatmeal raisin cookie D: two vegetables (zucchini, onion,  sweet potatoes, and carrots and green beans, yellow squash, avocado) and low salt meat (salmon or other fish), brown rice, low fat dairy. Sometimes ice cream. IBS - onions are trigger for her, will only have it sometimes. Pt has tried to remove cheese and dairy but it didn't seem to help. Doesn't cook with salt and is mindful about processed foods and salt. Pt doesn't like peanut butter. Discussed heart healthy eating as well as pulmonary MNT.      Intervention Plan   Intervention Prescribe, educate and counsel regarding individualized specific dietary modifications aiming towards targeted core components such as weight, hypertension, lipid management, diabetes, heart failure and other comorbidities.;Nutrition handout(s) given to patient.    Expected Outcomes Short Term Goal: Understand basic principles of dietary content, such as calories, fat, sodium, cholesterol and nutrients.;Short Term Goal: A plan has been developed with personal nutrition goals set during dietitian appointment.;Long Term Goal: Adherence to prescribed nutrition plan.           Nutrition Assessments:  Nutrition Assessments - 10/01/19 1438      MEDFICTS Scores   Pre Score 48           MEDIFICTS Score Key:          ?70 Need to make dietary changes          40-70 Heart Healthy Diet         ? 40 Therapeutic Level Cholesterol Diet  Nutrition Goals Re-Evaluation:  Nutrition Goals Re-Evaluation    [place] Name 11/12/19 1144 12/10/19 1126 01/09/20 1131         Goals   Nutrition Goal -- Add protein Add protein more vartiey     Comment Continue changes recommended by RD Continue changes recommended by RD.  She admits to eating a little more sugar recently as she has been feeling more down recently. Ashley [name] has really cut back on her diet sugary stuff and made some good changes.      Expected Outcome -- Short: Continue to follow changes Long: Focus on heart healthy. Continue to make healthier changes.            Nutrition Goals Discharge (Final Nutrition Goals Re-Evaluation):  Nutrition Goals Re-Evaluation - 01/09/20 1131      Goals   Nutrition Goal Add protein more vartiey    Comment Ashley [name] has really cut back on her diet sugary stuff and made some good changes.    Expected Outcome Continue to make healthier changes.           Psychosocial: Target Goals: Acknowledge presence or absence of significant depression and/or stress, maximize coping skills, provide positive support system. Participant is able to verbalize types and ability to use techniques and skills needed for reducing stress and depression.   Education: Depression - Provides group verbal and written instruction on the correlation between heart/lung disease and depressed mood, treatment options, and the stigmas associated with seeking treatment.   Cardiac Rehab from 12/17/2019 in [facility] Cardiac and Pulmonary Rehab  Date 12/17/19  Educator [facility]  Instruction Review Code 1- [employer] Understanding      Education: Sleep Hygiene -Provides  group verbal and written instruction about how sleep can affect your health.  Define sleep hygiene, discuss sleep cycles and impact of sleep habits. Review good sleep hygiene tips.     Education: Stress and Anxiety: - Provides group verbal and written instruction about the health risks of elevated stress and causes of high stress.  Discuss the correlation between heart/lung disease and anxiety and treatment options. Review healthy ways to manage with stress and anxiety.   Cardiac Rehab from 12/17/2019 in [facility] Cardiac and Pulmonary Rehab  Date 12/17/19  Educator [facility]  Instruction Review Code 1- Verbalizes Understanding       Initial Review & Psychosocial Screening:  Initial Psych Review & Screening - 09/30/19 1423      Initial Review   Current issues with Current  Sleep Concerns;Current Depression;Current Psychotropic Meds;Current Stress Concerns    Source of Stress Concerns Chronic Illness;Family    Comments Constantly dealing with health, does not sleep well and will nap during day,  feels well managed on current medication, Son and family live in [place] and they don't see them as much as they would like to, not on their radar      [place]? Yes   husband   Comments Son and family live in [place] and they don't see them as much as they would like to, not on their radar (grandkids 8th grade and high school)      Barriers   Psychosocial barriers to participate in program The patient should benefit from training in stress management and relaxation.;Psychosocial barriers identified (see note)      Screening Interventions   Interventions Encouraged to exercise;Provide feedback about the scores to participant;To provide support and resources with identified psychosocial needs    Expected Outcomes Short Term goal: Utilizing psychosocial counselor, staff and physician to assist with identification of specific Stressors or current issues interfering with healing process. Setting desired goal for each stressor or current issue identified.;Long Term Goal: Stressors or current issues are controlled or eliminated.;Short Term goal: Identification and review with participant of any Quality of Life or Depression concerns found by scoring the questionnaire.;Long Term goal: The participant improves quality of Life and PHQ9 Scores as seen by post scores and/or verbalization of changes           Quality of Life Scores:   Quality of Life - 01/12/20 1115      Quality of Life Scores   Health/Function Pre 8.07 %    Health/Function Post 12 %    Health/Function % Change 48.7 %    Socioeconomic Pre 21.57 %    Socioeconomic Post 24.21 %    Socioeconomic % Change  12.24 %    Psych/Spiritual Pre 17.79 %    Psych/Spiritual Post 17.79 %     Psych/Spiritual % Change 0 %    Family Pre 18 %    Family Post 19.2 %    Family % Change 6.67 %    GLOBAL Pre 14.37 %    GLOBAL Post 16.91 %    GLOBAL % Change 17.68 %          Scores of 19 and below usually indicate a poorer quality of life in these areas.  A difference of  2-3 points is a clinically meaningful difference.  A difference of 2-3 points in the total score of the Quality of Life Index has been associated with significant improvement in overall quality of life, self-image, physical symptoms, and general health  in studies assessing change in quality of life.  PHQ-9: Recent Review Flowsheet Data    Depression screen [facility] 2/9 01/12/2020 12/29/2019 12/01/2019 12/01/2019 11/05/2019   Decreased Interest 1 1 0 1 0   Down, Depressed, Hopeless 2 2 0 1 1   PHQ - 2 Score 3 3 0 2 1   Altered sleeping 3 3 0 3 3   Tired, decreased energy 3 3 0 3 3   Change in appetite 2 2 0 1 1   Feeling bad or failure about yourself  0 0 0 0 0   Trouble concentrating 1 1 0 1 1   Moving slowly or fidgety/restless 0 0 0 0 0   Suicidal thoughts 0 0 0 0 0   PHQ-9 Score 12 12 0 10 9   Difficult doing work/chores Somewhat difficult Somewhat difficult Not difficult at all Somewhat difficult Somewhat difficult     Interpretation of Total Score  Total Score Depression Severity:  1-4 = Minimal depression, 5-9 = Mild depression, 10-14 = Moderate depression, 15-19 = Moderately severe depression, 20-27 = Severe depression   Psychosocial Evaluation and Intervention:  Psychosocial Evaluation - 01/09/20 1129      Discharge Psychosocial Assessment & Intervention   Comments Ashley [name] feels that she has not gotten much out of rehab.  She still feels bad and fatigued everyday. She hates that she cannot go and do like she used to.  She is depressed by this, but does not want to exercise on her own. She has gained weight and not happy about that either.  She is going to graduate next week.           Psychosocial  Re-Evaluation:  Psychosocial Re-Evaluation    [place] Name 11/12/19 1144 12/01/19 1142 12/10/19 1118 12/29/19 1130       Psychosocial Re-Evaluation   Current issues with Current Stress Concerns;Current Sleep Concerns Current Psychotropic Meds;Current Sleep Concerns;Current Stress Concerns Current Psychotropic Meds;Current Sleep Concerns;Current Stress Concerns Current Stress Concerns;Current Depression;History of Depression    Comments Ashley [name] still does not sleep well consistently due to health conditions.  She has spoken to her Dr about sleep.  She doesnt want to take more medication.  She does take naps during the day.  She feels her mental health is as good as it can get.  She does have hobbies she enjoys and also has friends over for dinner and to play cards. Reviewed patient health questionnaire (PHQ-9) with patient for follow up. Previously, patients score indicated signs/symptoms of depression.  Reviewed to see if patient is improving symptom wise while in program.  Score declined and patient states that it is because she is not able to sleep well and has bowel issues. She said she has been dealing with it since 2012 and nothing has helped. She is still not sleeping well and feels tired all the time.  She has halved her anti-anxiety med to help her sleep better over the last week.  She admits to feeling down, sad, and hopeless most of the time.  She is on antidepressant and her anti-anxiety is makes her feel to low or too high.  She does not like how the meds make her feel. She knows that as long as she is fighting she is alive.  She feels that coming to exericse is an added stressor.  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  We looked at her METs to show the progression that we are seeing.  Sometimes she will nap  for 3-4 hours in the afternoon. Reviewed patient health questionnaire (PHQ-9) with patient for follow up. Previously, patients score indicated signs/symptoms of depression.  Reviewed to see if  patient is improving symptom wise while in program.  Score declined and patient states that it is because her family has not been including her in events and are not paying attention to her.    Expected Outcomes Short:  continue to do social activities Long:  maintain positive outlook Short: Continue to work toward an improvement in [place] scores by attending [facility] regularly. Long: Continue to improve stress and depression coping skills by talking with staff and attending [facility] regularly and work toward a positive mental state. Short: Continue to exercise for mental boost Long: Continue to try to find the positive Short: Continue to work toward an improvement in PHQ9 scores by attending [facility] regularly. Long: Continue to improve stress and depression coping skills by talking with staff and attending [facility] regularly and work toward a positive mental state.    Interventions -- Encouraged to attend Cardiac Rehabilitation for the exercise Encouraged to attend Cardiac Rehabilitation for the exercise Encouraged to attend Cardiac Rehabilitation for the exercise    Continue Psychosocial Services  -- Follow up required by staff Follow up required by staff Follow up required by staff           Psychosocial Discharge (Final Psychosocial Re-Evaluation):  Psychosocial Re-Evaluation - 12/29/19 1130      Psychosocial Re-Evaluation   Current issues with Current Stress Concerns;Current Depression;History of Depression    Comments Reviewed patient health questionnaire (PHQ-9) with patient for follow up. Previously, patients score indicated signs/symptoms of depression.  Reviewed to see if patient is improving symptom wise while in program.  Score declined and patient states that it is because her family has not been including her in events and are not paying attention to her.    Expected Outcomes Short: Continue to work toward an improvement in [place] scores by attending  [facility] regularly. Long: Continue to improve stress and depression coping skills by talking with staff and attending [facility] regularly and work toward a positive mental state.    Interventions Encouraged to attend Cardiac Rehabilitation for the exercise    Continue Psychosocial Services  Follow up required by staff           Vocational Rehabilitation: Provide vocational rehab assistance to qualifying candidates.   Vocational Rehab Evaluation & Intervention:  Vocational Rehab - 09/30/19 1423      Initial Vocational Rehab Evaluation & Intervention   Assessment shows need for Vocational Rehabilitation No           Education: Education Goals: Education classes will be provided on a variety of topics geared toward better understanding of heart health and risk factor modification. Participant will state understanding/return demonstration of topics presented as noted by education test scores.  Learning Barriers/Preferences:  Learning Barriers/Preferences - 09/30/19 1417      Learning Barriers/Preferences   Learning Barriers --   arthritis and trigger finger can cause her to go slowly   Learning Preferences Skilled Demonstration           General Cardiac Education Topics:  AED/CPR: - Group verbal and written instruction with the use of models to demonstrate the basic use of the AED with the basic ABC's of resuscitation.   Anatomy & Physiology of the Heart: - Group verbal and written instruction and models provide basic cardiac anatomy and physiology, with the coronary electrical and arterial systems. Review  of Valvular disease and Heart Failure   Cardiac Procedures: - Group verbal and written instruction to review commonly prescribed medications for heart disease. Reviews the medication, class of the drug, and side effects. Includes the steps to properly store meds and maintain the prescription regimen. (beta blockers and nitrates)   Cardiac  Medications I: - Group verbal and written instruction to review commonly prescribed medications for heart disease. Reviews the medication, class of the drug, and side effects. Includes the steps to properly store meds and maintain the prescription regimen.   Cardiac Rehab from 12/17/2019 in [facility] Cardiac and Pulmonary Rehab  Date 12/10/19  Educator [facility]  Instruction Review Code 1- Verbalizes Understanding      Cardiac Medications II: -Group verbal and written instruction to review commonly prescribed medications for heart disease. Reviews the medication, class of the drug, and side effects. (all other drug classes)    Go Sex-Intimacy & Heart Disease, Get SMART - Goal Setting: - Group verbal and written instruction through game format to discuss heart disease and the return to sexual intimacy. Provides group verbal and written material to discuss and apply goal setting through the application of the S.M.A.R.T. Method.   Other Matters of the Heart: - Provides group verbal, written materials and models to describe Stable Angina and Peripheral Artery. Includes description of the disease process and treatment options available to the cardiac patient.   Infection Prevention: - Provides verbal and written material to individual with discussion of infection control including proper hand washing and proper equipment cleaning during exercise session.   Cardiac Rehab from 12/17/2019 in [facility] Cardiac and Pulmonary Rehab  Date 10/01/19  Educator [facility]  Instruction Review Code 1- Verbalizes Understanding      Falls Prevention: - Provides verbal and written material to individual with discussion of falls prevention and safety.   Cardiac Rehab from 12/17/2019 in [facility] Cardiac and Pulmonary Rehab  Date 10/01/19  Educator [facility]  Instruction Review Code 1- Verbalizes Understanding      Other: -Provides group and verbal instruction on various topics (see comments)   Knowledge Questionnaire Score:  Knowledge  Questionnaire Score - 01/12/20 1117      Knowledge Questionnaire Score   Post Score 22/26           Core Components/Risk Factors/Patient Goals at Admission:  Personal Goals and Risk Factors at Admission - 10/01/19 1438      Core Components/Risk Factors/Patient Goals on Admission    Weight Management Yes;Weight Loss    Intervention Weight Management: Develop a combined nutrition and exercise program designed to reach desired caloric intake, while maintaining appropriate intake of nutrient and fiber, sodium and fats, and appropriate energy expenditure required for the weight goal.;Weight Management: Provide education and appropriate resources to help participant work on and attain dietary goals.    Admit Weight 178 lb 9.6 oz (81 kg)    Goal Weight: Short Term 173 lb (78.5 kg)    Goal Weight: Long Term 168 lb (76.2 kg)    Expected Outcomes Short Term: Continue to assess and modify interventions until short term weight is achieved;Long Term: Adherence to nutrition and physical activity/exercise program aimed toward attainment of established weight goal;Weight Loss: Understanding of general recommendations for a balanced deficit meal plan, which promotes 1-2 lb weight loss per week and includes a negative energy balance of [phone] kcal/d;Understanding recommendations for meals to include 15-35% energy as protein, 25-35% energy from fat, 35-60% energy from carbohydrates, less than 242m of dietary cholesterol, 20-35 gm  of total fiber daily;Understanding of distribution of calorie intake throughout the day with the consumption of 4-5 meals/snacks    Heart Failure Yes    Intervention Provide a combined exercise and nutrition program that is supplemented with education, support and counseling about heart failure. Directed toward relieving symptoms such as shortness of breath, decreased exercise tolerance, and extremity edema.    Expected Outcomes Improve functional capacity of life;Short term:  Attendance in program 2-3 days a week with increased exercise capacity. Reported lower sodium intake. Reported increased fruit and vegetable intake. Reports medication compliance.;Short term: Daily weights obtained and reported for increase. Utilizing diuretic protocols set by physician.;Long term: Adoption of self-care skills and reduction of barriers for early signs and symptoms recognition and intervention leading to self-care maintenance.    Lipids Yes    Intervention Provide education and support for participant on nutrition & aerobic/resistive exercise along with prescribed medications to achieve LDL <39m, HDL >436m    Expected Outcomes Short Term: Participant states understanding of desired cholesterol values and is compliant with medications prescribed. Participant is following exercise prescription and nutrition guidelines.;Long Term: Cholesterol controlled with medications as prescribed, with individualized exercise RX and with personalized nutrition plan. Value goals: LDL < 7024mHDL > 40 mg.           Education:Diabetes - Individual verbal and written instruction to review signs/symptoms of diabetes, desired ranges of glucose level fasting, after meals and with exercise. Acknowledge that pre and post exercise glucose checks will be done for 3 sessions at entry of program.   Education: Know Your Numbers and Risk Factors: -Group verbal and written instruction about important numbers in your health.  Discussion of what are risk factors and how they play a role in the disease process.  Review of Cholesterol, Blood Pressure, Diabetes, and BMI and the role they play in your overall health.   Core Components/Risk Factors/Patient Goals Review:   Goals and Risk Factor Review    Row Name 11/12/19 1134 12/10/19 1124 01/09/20 1127         Core Components/Risk Factors/Patient Goals Review   Personal Goals Review Weight Management/Obesity;Heart Failure;Hypertension;Lipids Weight  Management/Obesity;Heart Failure;Hypertension;Lipids Weight Management/Obesity;Heart Failure;Hypertension;Lipids     Review Ashley [name] is taking all meds as directed.  She checks her BP at home if she doesnt feel well.  She has more trouble with low BP than high.  She is monitoring weight at home.  Her HF symptoms are the same.  She still has chest pain and has been working with her Dr on this since 2012.  She is watching her diet for GI reasons. Ashley [name] has gained a little weight over the last few months. Blood pressures have been good for most part.  She has had a few days where it runs low and then takes her most of the day to recover.  She continues to feel like to is just fighting each day with her heart failure symptoms.  She currently denies any swelling or chest pain. Ashley [name] is waiting to see if her EF will improve.  It is up from 15 to 20% but she still does not feel well.  Every time she gets sick it takes her longer to recover.  She still wants to feel better. Her weight is trending up, but she does not exercise at home. She has started [employer] to help with her breathing.     Expected Outcomes Short: continue to exercise consistently and monitor risk factors Long: manage risk factors Short: Add  in exercise to boost energy Long; Continue to monitor risk factors. Short: Add in exercise at home  Long: Continue to manage heart failure.            Core Components/Risk Factors/Patient Goals at Discharge (Final Review):   Goals and Risk Factor Review - 01/09/20 1127      Core Components/Risk Factors/Patient Goals Review   Personal Goals Review Weight Management/Obesity;Heart Failure;Hypertension;Lipids    Review Ashley [name] is waiting to see if her EF will improve.  It is up from 15 to 20% but she still does not feel well.  Every time she gets sick it takes her longer to recover.  She still wants to feel better. Her weight is trending up, but she does not exercise at home. She has started [employer] to help with  her breathing.    Expected Outcomes Short: Add in exercise at home  Long: Continue to manage heart failure.           ITP Comments:  ITP Comments    Row Name 09/30/19 1437 10/01/19 1434 10/03/19 1113 10/29/19 0638 10/29/19 1240   ITP Comments Completed virtual orientation today.  EP evaluation is scheduled for Thursday 5/19 at 1130.  Documentation for diagnosis can be found in [facility] encounter 09/22/19. Completed 6MWT and gym orientation.  Initial ITP created and sent for review to Dr. Emily Filbert, Medical Director. First full day of exercise!  Patient was oriented to gym and equipment including functions, settings, policies, and procedures.  Patient's individual exercise prescription and treatment plan were reviewed.  All starting workloads were established based on the results of the 6 minute walk test done at initial orientation visit.  The plan for exercise progression was also introduced and progression will be customized based on patient's performance and goals 30 Day review completed. Medical Director ITP review done, changes made as directed, and signed approval by Medical Director. Pt has not attended since 10/17/19.   [place] Name 11/11/19 1523 11/26/19 1102 12/24/19 0633 01/12/20 1112     ITP Comments Ashley [name] came on 6/28 but had to send her home due to her lack of toe covered shoes. 30 Day review completed. Medical Director ITP review done, changes made as directed, and signed approval by Medical Director. 30 Day review completed. Medical Director ITP review done, changes made as directed, and signed approval by Medical Director. Payge graduated today from  rehab with 31 sessions completed.  Details of the patient's exercise prescription and what She needs to do in order to continue the prescription and progress were discussed with patient.  Patient was given a copy of prescription and goals.  Patient verbalized understanding.  Aesha plans to continue to exercise by walking at home.            Comments: Discharge ITP

## 2020-01-16 IMAGING — US US RENAL
1 series · 14 of 25 positions shown · non-contrast
Comparison: CT 06/18/2017 and 02/09/2016.

CLINICAL DATA: Follow-up of 9 mm right renal lesion seen on CT.

EXAM:
RENAL / URINARY TRACT ULTRASOUND COMPLETE

[Series 1: us renal · 0.26mm/px · 14 of 34 slices shown]
[im 1/34]
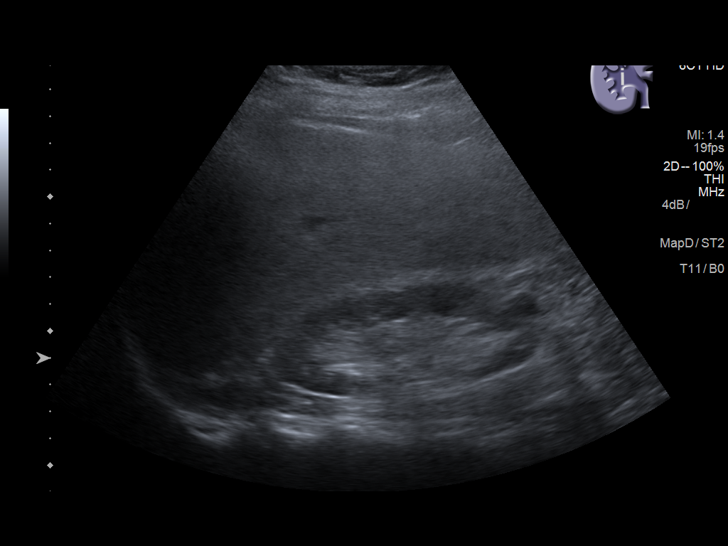
[im 3/34]
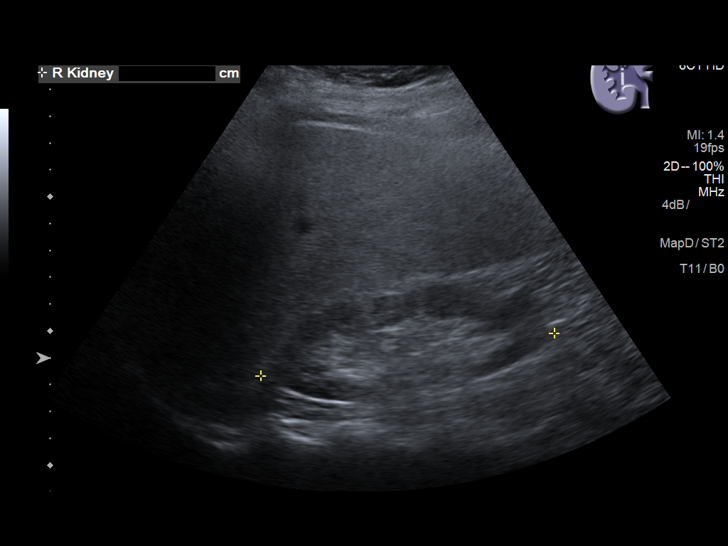
[im 6/34]
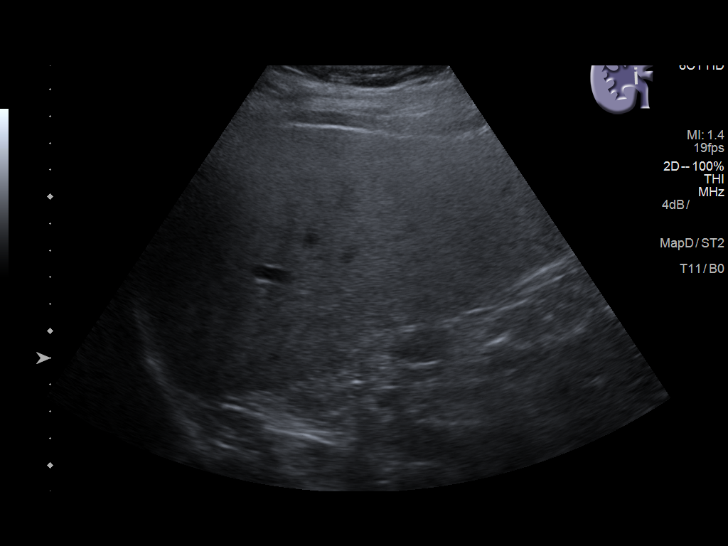
[im 9/34]
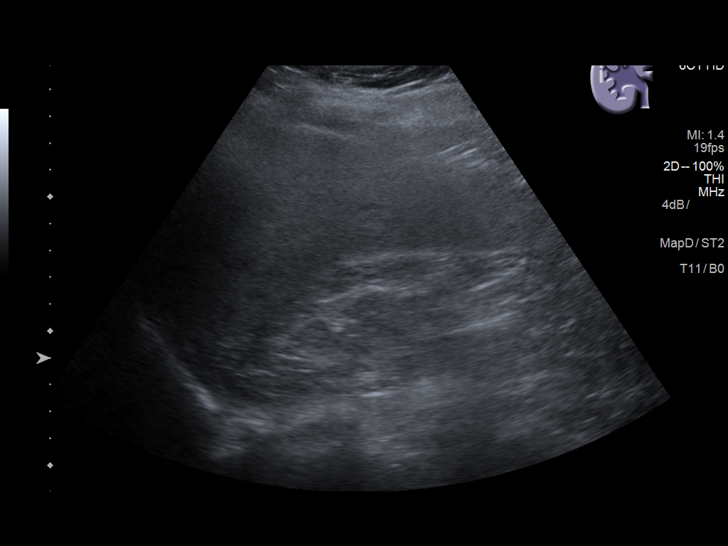
[im 12/34]
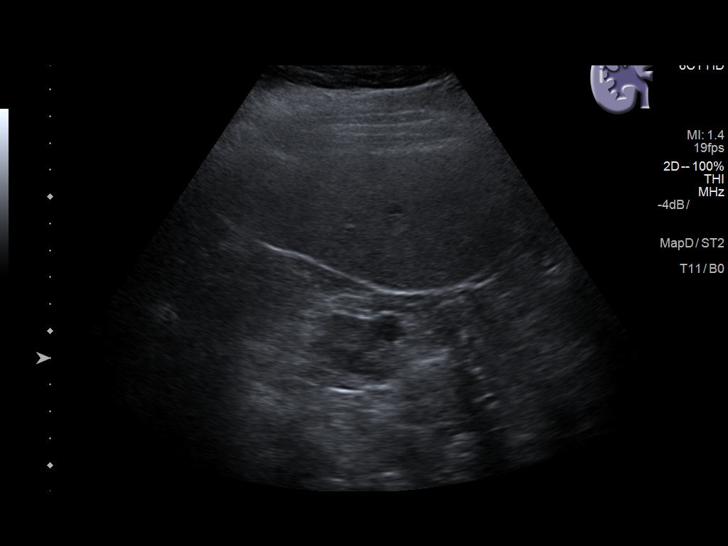
[im 13/34]
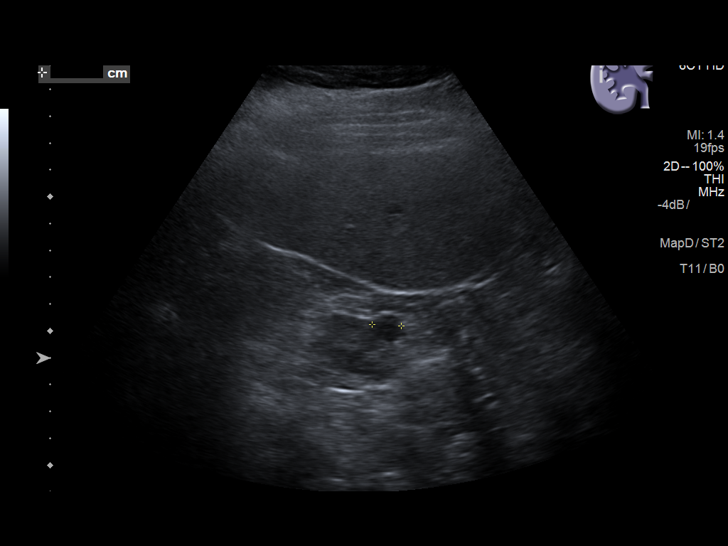
[im 16/34]
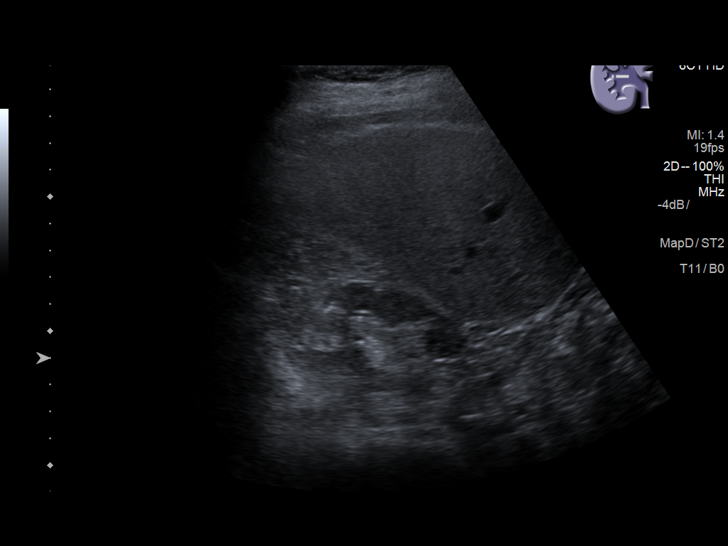
[im 18/34]
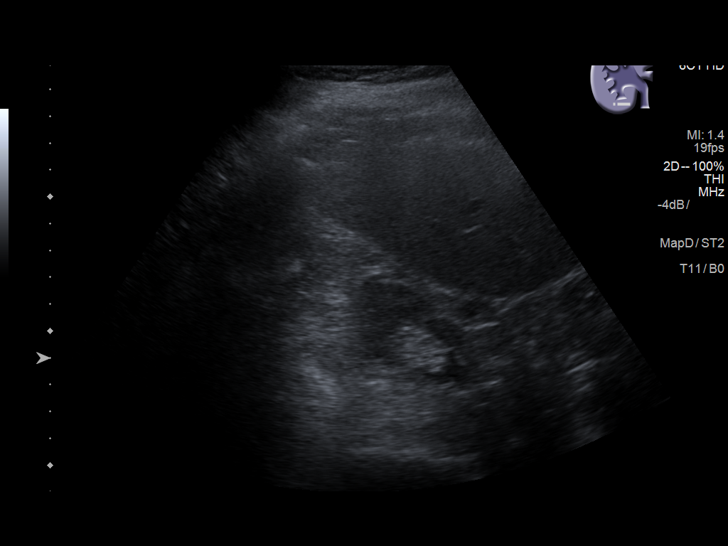
[im 21/34]
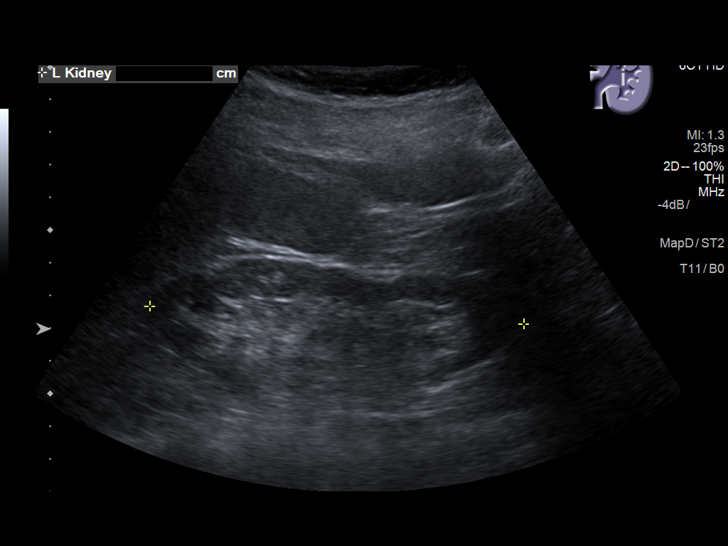
[im 23/34]
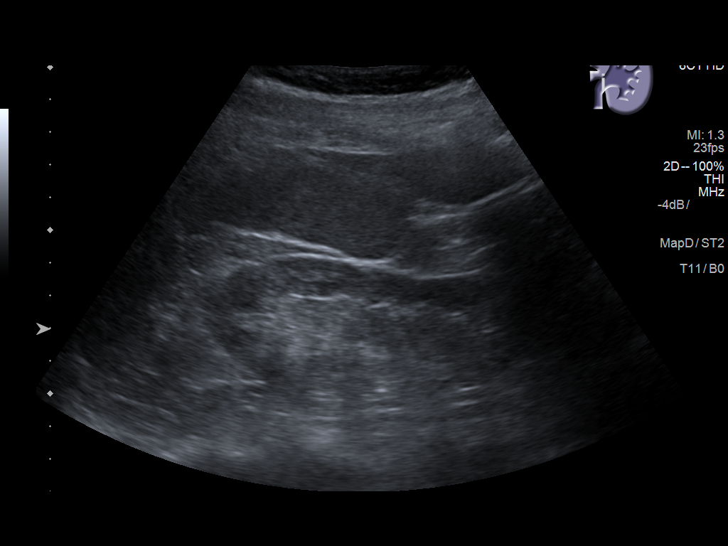
[im 25/34]
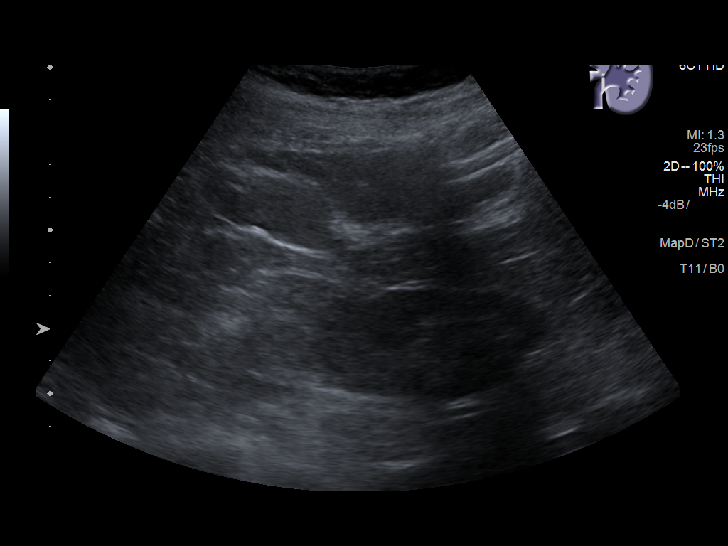
[im 28/34]
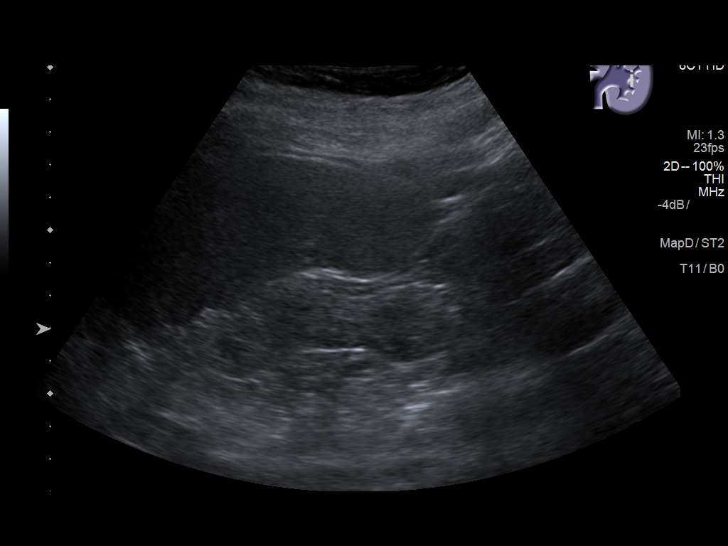
[im 31/34]
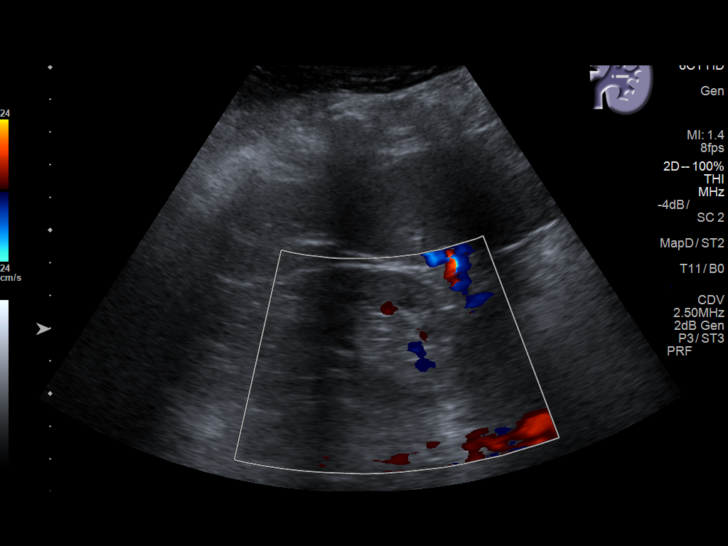
[im 34/34]
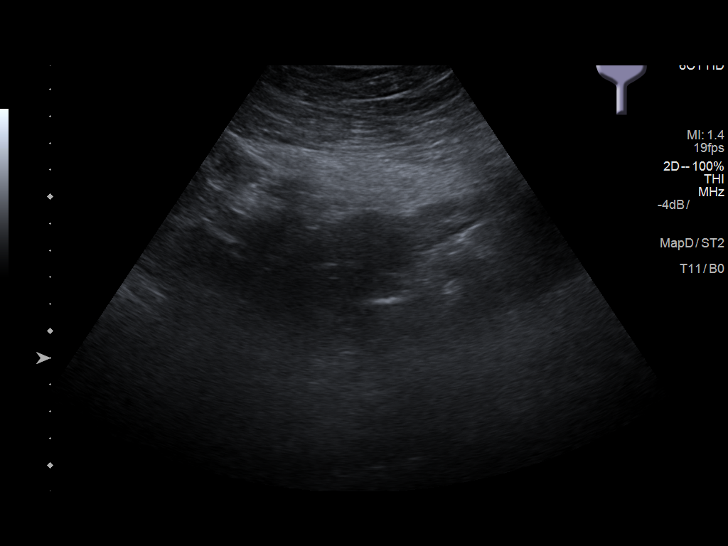

[14 of 25 positions shown; findings below may reference images not displayed]

FINDINGS: Right Kidney:

Length: 11.0 cm. No hydronephrosis. Normal renal cortical thickness
for age. A lower pole 1.2 cm lesion is markedly hypoechoic with
enhanced through transmission, including on image 11. The smaller
right-sided renal lesions are not visualized.

Left Kidney:

Length: 11.4 cm.. No hydronephrosis. Normal renal cortical thickness
for age. No focal lesion identified.

Bladder:

Decompressed.
IMPRESSION: 1.2 cm lower pole right renal lesion is most consistent with a cyst
or minimally complex cyst. Other smaller right renal lesions are not
identified.

No hydronephrosis.

## 2020-01-20 ENCOUNTER — Telehealth: Payer: Self-pay

## 2020-01-20 DIAGNOSIS — M25521 Pain in right elbow: Secondary | ICD-10-CM | POA: Diagnosis not present

## 2020-01-20 DIAGNOSIS — M25551 Pain in right hip: Secondary | ICD-10-CM | POA: Diagnosis not present

## 2020-01-20 DIAGNOSIS — M25511 Pain in right shoulder: Secondary | ICD-10-CM | POA: Diagnosis not present

## 2020-01-20 DIAGNOSIS — M25531 Pain in right wrist: Secondary | ICD-10-CM | POA: Diagnosis not present

## 2020-01-20 DIAGNOSIS — M25571 Pain in right ankle and joints of right foot: Secondary | ICD-10-CM | POA: Diagnosis not present

## 2020-01-20 NOTE — Telephone Encounter (Signed)
[facility] - Client TELEPHONE ADVICE RECORD [facility] Patient Name: Ashley [name] Gender: Female DOB: 08-02-1945 Age: 74 Y 33 M 1 D Return Phone Number: [phone] (Primary), [phone] (Alternate) [place] [address] Client [facility] - Client Client Site [place] Physician [name] - MD Contact Type Call Who Is Calling Patient / Member / Family / Caregiver Call Type Triage / Clinical Relationship To Patient Self Return Phone Number [phone] (Primary) Chief Complaint Hip Injury Reason for Call Symptomatic / Request for [place] states she fell and injured her wrist and hip. Translation No Nurse Assessment Nurse: Rock Nephew, RN, Juliann Pulse Date/Time (Eastern Time): 01/20/2020 10:56:09 AM Confirm and document reason for call. If symptomatic, describe symptoms. ---Caller states she fell yesterday ( fell to asphalt while walking) and injured her ( right ) wrist and hip. Has the patient had close contact with a person known or suspected to have the novel coronavirus illness OR traveled / lives in area with major community spread (including international travel) in the last 14 days from the onset of symptoms? * If Asymptomatic, screen for exposure and travel within the last 14 days. ---No Does the patient have any new or worsening symptoms? ---Yes Will a triage be completed? ---Yes Related visit to physician within the last 2 weeks? ---No Does the PT have any chronic conditions? (i.e. diabetes, asthma, this includes High risk factors for pregnancy, etc.) ---Yes List chronic conditions. ---CAD, CHF Is this a behavioral health or substance abuse call? ---No Guidelines Guideline Title Affirmed Question Affirmed Notes Nurse Date/Time [name] Time) Arm Injury Looks like a dislocated joint Wells, RN, Juliann Pulse 01/20/2020 10:59:42  AM Disp. Time [name] Time) Disposition Final User 01/20/2020 11:03:40 AM Go to ED Now Yes Rock Nephew, RN, Juliann Pulse PLEASE NOTE: All timestamps contained within this report are represented as [place] Standard Time. CONFIDENTIALTY NOTICE: This fax transmission is intended only for the addressee. It contains information that is legally privileged, confidential or otherwise protected from use or disclosure. If you are not the intended recipient, you are strictly prohibited from reviewing, disclosing, copying using or disseminating any of this information or taking any action in reliance on or regarding this information. If you have received this fax in error, please notify us immediately by telephone so that we can arrange for its return to [place]. Phone: [phone], Toll-Free: [phone], Fax: [phone] Page: 2 of 2 Call Id: [id number] [place] Disagree/Comply Disagree Caller Understands Yes PreDisposition Call Doctor Care Advice Given Per Guideline GO TO ED NOW: * You need to be seen in the Emergency Department. * It is better and safer if another adult drives instead of you. NOTHING BY MOUTH: * Do not eat or drink anything for now. CARE ADVICE given per Arm Injury (Adult) guideline. Comments User: Valetta Mole, RN Date/Time [name] Time): 01/20/2020 11:12:29 AM I called the office back line but no one answered so I called the main number and had about a 5 minute hold. I explained to [facility] that patient refuses ED outcome , she wants to be seen in office for x-rays. Referrals GO TO FACILITY REFUSED

## 2020-01-20 NOTE — Telephone Encounter (Signed)
Received a call from [facility] taht pt had fallen and had an arm injury and pt is refusing advise of ER. Will send to [facility] to Triage.

## 2020-01-20 NOTE — Telephone Encounter (Signed)
I spoke with pt; no available appts at [facility] today. Pt fell on 01/19/20: rt side hit asphalt; rt wrist is worse and cannot pick up anything with rt hand; also having rt hip pain, knee and elbow(the hip, knee and elbow pain is not bad today per pt. Pt has been seen within the year at [facility] in [place] and pt will call to see if can be seen at the ortho office or go to [facility] in [place]. FYI to Dr Darnell Level.

## 2020-01-20 NOTE — Telephone Encounter (Signed)
Already spoken  With pt and pt to go to [facility] in [place] or  [facility] in [place].

## 2020-01-21 NOTE — Telephone Encounter (Addendum)
Please call for update on fall and ortho eval.   ADDENDUM -> I received [facility] note fortunately all xrays returned ok, placed in ankle brace.

## 2020-01-21 NOTE — Telephone Encounter (Signed)
Spoke with pt asking for update and relaying Dr. Synthia Innocent message.  Pt told the fall probably stirred up arthritis and inflammation throughout her body.  If she is still having pain after 10 days, told to return for f/u x-ray to check again for any fx.  Pt is also wearing a right wrist brace.  Says she is walking a lot better with ankle brace.

## 2020-01-27 ENCOUNTER — Ambulatory Visit: Payer: Medicare Other | Admitting: Cardiovascular Disease

## 2020-01-27 DIAGNOSIS — M25571 Pain in right ankle and joints of right foot: Secondary | ICD-10-CM | POA: Diagnosis not present

## 2020-01-27 DIAGNOSIS — M25531 Pain in right wrist: Secondary | ICD-10-CM | POA: Diagnosis not present

## 2020-01-28 DIAGNOSIS — L918 Other hypertrophic disorders of the skin: Secondary | ICD-10-CM | POA: Diagnosis not present

## 2020-01-28 DIAGNOSIS — L570 Actinic keratosis: Secondary | ICD-10-CM | POA: Diagnosis not present

## 2020-01-28 DIAGNOSIS — M71342 Other bursal cyst, left hand: Secondary | ICD-10-CM | POA: Diagnosis not present

## 2020-02-03 ENCOUNTER — Telehealth: Payer: Self-pay | Admitting: *Deleted

## 2020-02-03 NOTE — Telephone Encounter (Signed)
Unfortunately don't recommend lower dose covid vaccine as this dose has not been studied for adults so don't know if it would be effective protection for her.  Would either suggest full dose with close monitoring or abstaining from shot.

## 2020-02-03 NOTE — Telephone Encounter (Signed)
Patient calling to discuss again infusion for preventing covid and if [name] thinks its ok to take the Childrens dose of vaccine.   Please call and give some sort of answer per patient request

## 2020-02-03 NOTE — Telephone Encounter (Signed)
Patient called stating that she just saw the governor on a news report stating that they are going to start giving kids ages 54-11  the covid vaccine soon. Patient stated the kids dose is half of what the adults get. Patient stated that she has so many strong reactions to medications and vaccines she wants to know what Dr. Danise Mina thinks about her getting the children's covid vaccine? Patient stated that she feels the children's dose is better than no dose. Patient requested a call back about this.

## 2020-02-04 NOTE — Telephone Encounter (Signed)
Spoke with pt relaying Dr. Synthia Innocent message.  Pt verbalizes understanding and declines to have COVID vaccine at this time.

## 2020-02-04 NOTE — Telephone Encounter (Signed)
Left voicemail message to call back  

## 2020-02-04 NOTE — Telephone Encounter (Signed)
Spoke with patient and she wanted to know if she could go out of state to get the vitamin infusion for COVID. Advised that she would need to be COVID positive in order to receive that infusion. She stated that Dr. Danise Mina office answered question about injection. She was appreciative for the call with no further questions at this time.

## 2020-02-09 ENCOUNTER — Ambulatory Visit (INDEPENDENT_AMBULATORY_CARE_PROVIDER_SITE_OTHER): Payer: Medicare Other | Admitting: Emergency Medicine

## 2020-02-09 DIAGNOSIS — I420 Dilated cardiomyopathy: Secondary | ICD-10-CM | POA: Diagnosis not present

## 2020-02-10 LAB — CUP PACEART REMOTE DEVICE CHECK
Battery Remaining Longevity: 82 mo
Battery Voltage: 3 V
Brady Statistic AP VP Percent: 0.12 %
Brady Statistic AP VS Percent: 0.01 %
Brady Statistic AS VP Percent: 99.61 %
Brady Statistic AS VS Percent: 0.26 %
Brady Statistic RA Percent Paced: 0.13 %
Brady Statistic RV Percent Paced: 99.06 %
Date Time Interrogation Session: 20210927043622
HighPow Impedance: 89 Ohm
Implantable Lead Implant Date: 20130301
Implantable Lead Implant Date: 20200622
Implantable Lead Implant Date: 20200622
Implantable Lead Location: 753859
Implantable Lead Location: 753860
Implantable Lead Location: 753860
Implantable Lead Model: 3830
Implantable Lead Model: 5076
Implantable Lead Model: 6935
Implantable Pulse Generator Implant Date: 20200622
Lead Channel Impedance Value: 304 Ohm
Lead Channel Impedance Value: 342 Ohm
Lead Channel Impedance Value: 437 Ohm
Lead Channel Impedance Value: 456 Ohm
Lead Channel Impedance Value: 513 Ohm
Lead Channel Impedance Value: 513 Ohm
Lead Channel Pacing Threshold Amplitude: 0.375 V
Lead Channel Pacing Threshold Amplitude: 0.375 V
Lead Channel Pacing Threshold Amplitude: 0.5 V
Lead Channel Pacing Threshold Pulse Width: 0.4 ms
Lead Channel Pacing Threshold Pulse Width: 0.4 ms
Lead Channel Pacing Threshold Pulse Width: 0.4 ms
Lead Channel Sensing Intrinsic Amplitude: 2.375 mV
Lead Channel Sensing Intrinsic Amplitude: 2.375 mV
Lead Channel Sensing Intrinsic Amplitude: 7 mV
Lead Channel Sensing Intrinsic Amplitude: 7 mV
Lead Channel Setting Pacing Amplitude: 1 V
Lead Channel Setting Pacing Amplitude: 1.5 V
Lead Channel Setting Pacing Amplitude: 2.5 V
Lead Channel Setting Pacing Pulse Width: 0.4 ms
Lead Channel Setting Pacing Pulse Width: 0.4 ms
Lead Channel Setting Sensing Sensitivity: 0.3 mV

## 2020-02-11 NOTE — Progress Notes (Signed)
Remote ICD transmission.   

## 2020-02-24 ENCOUNTER — Ambulatory Visit (INDEPENDENT_AMBULATORY_CARE_PROVIDER_SITE_OTHER): Payer: Medicare Other

## 2020-02-24 ENCOUNTER — Other Ambulatory Visit: Payer: Self-pay

## 2020-02-24 DIAGNOSIS — I420 Dilated cardiomyopathy: Secondary | ICD-10-CM | POA: Diagnosis not present

## 2020-02-24 LAB — ECHOCARDIOGRAM COMPLETE
AR max vel: 2.27 cm2
AV Area VTI: 2.21 cm2
AV Area mean vel: 2.32 cm2
AV Mean grad: 5 mmHg
AV Peak grad: 8.3 mmHg
Ao pk vel: 1.44 m/s
Area-P 1/2: 2.15 cm2
S' Lateral: 4.1 cm

## 2020-02-24 MED ORDER — PERFLUTREN LIPID MICROSPHERE
1.0000 mL | INTRAVENOUS | Status: AC | PRN
  Administered 2020-02-24: 2 mL via INTRAVENOUS

## 2020-02-25 ENCOUNTER — Telehealth: Payer: Self-pay | Admitting: Cardiovascular Disease

## 2020-02-25 DIAGNOSIS — I5022 Chronic systolic (congestive) heart failure: Secondary | ICD-10-CM

## 2020-02-25 DIAGNOSIS — I420 Dilated cardiomyopathy: Secondary | ICD-10-CM

## 2020-02-25 NOTE — Telephone Encounter (Signed)
Sent patient my chart message with phone number per her request. Will await results as well.

## 2020-02-25 NOTE — Telephone Encounter (Signed)
Spoke with patient and she reports pain in right rib cage that shoots through her every so often. Reviewed any complications from that medication is usually within the first 5 minutes so her symptoms are not from that. She then reviewed she has constipation so hard she has to pick it out 2 times in a week. She is now only having to do it once per week. Now she has normal stools but it is now dark for about 2 weeks. Recommended that she please call her GI provider to review this information with. Advised that Dr. [name] does want her to see Dr. [name] in [place] for further assessment as well. Notified Heather RN over in his office and will place another referral as well. She verbalized understanding with our conversation, agreement with plan, and has no further questions at this time.

## 2020-02-25 NOTE — Telephone Encounter (Signed)
Patient calling to check on status.

## 2020-02-25 NOTE — Telephone Encounter (Signed)
Patient was in for an echocardiogram yesterday wuth an IV. Patient is complaining of rib pain, pain like a fracture. Patient reporting this to see if this may be a reaction to the medication  And what she should do  Please advise

## 2020-02-26 ENCOUNTER — Telehealth (HOSPITAL_COMMUNITY): Payer: Self-pay | Admitting: Internal Medicine

## 2020-02-26 DIAGNOSIS — Z779 Other contact with and (suspected) exposures hazardous to health: Secondary | ICD-10-CM | POA: Diagnosis not present

## 2020-02-26 DIAGNOSIS — M810 Age-related osteoporosis without current pathological fracture: Secondary | ICD-10-CM | POA: Diagnosis not present

## 2020-02-26 DIAGNOSIS — Z1231 Encounter for screening mammogram for malignant neoplasm of breast: Secondary | ICD-10-CM | POA: Diagnosis not present

## 2020-02-26 DIAGNOSIS — B372 Candidiasis of skin and nail: Secondary | ICD-10-CM | POA: Diagnosis not present

## 2020-02-26 NOTE — Telephone Encounter (Signed)
Pt stated Dr Festus Aloe advised her to talk to DB about

## 2020-03-08 ENCOUNTER — Telehealth: Payer: Self-pay

## 2020-03-08 ENCOUNTER — Other Ambulatory Visit: Payer: Self-pay

## 2020-03-08 ENCOUNTER — Ambulatory Visit: Payer: Medicare Other

## 2020-03-08 DIAGNOSIS — E782 Mixed hyperlipidemia: Secondary | ICD-10-CM

## 2020-03-08 DIAGNOSIS — I5022 Chronic systolic (congestive) heart failure: Secondary | ICD-10-CM

## 2020-03-08 NOTE — Patient Instructions (Addendum)
Visit Information  Goals Addressed            This Visit's Progress   . Pharmacy Care Plan: Cholesterol       CARE PLAN ENTRY  Current Barriers:  . Uncontrolled hyperlipidemia . Current antihyperlipidemic regimen:   Red yeast rice - 1 capsule BID  Ezetimibe 10 mg - 1 tablet daily  . Previous antihyperlipidemic medications tried: atorvastatin, pravastatin, simvastatin, rosuvastatin . Most recent lipid panel:     Component Value Date/Time   CHOL 217 (H) 03/18/2019 0933   TRIG 199.0 (H) 03/18/2019 0933   HDL 46.60 03/18/2019 0933   CHOLHDL 5 03/18/2019 0933   VLDL 39.8 03/18/2019 0933   LDLCALC 131 (H) 03/18/2019 0933   LDLDIRECT 164.0 11/27/2018 1407 .  ASCVD risk enhancing conditions: age >65, CHF . 10-year ASCVD risk score: 14.1% (intermediate risk)  Pharmacist Clinical Goal(s):  Marland Kitchen Over the next 6 months, patient will work with PharmD and providers toward the following goals: o Reduce LDL (bad cholesterol) within goal of less than 100 o Improve HDL (good cholesterol) within goal of greater than 50 o Reduce triglycerides within goal of less than 150   Interventions: . Comprehensive medication review performed; medication list updated in electronic medical record.   Patient Self Care Activities:  . Patient will focus on medication adherence by using pillbox and taking medication daily . Focus on heart healthy diet high in fiber, whole grains, lean meats, fruits, vegetables, and fish   Initial goal documentation and Please see past updates related to this goal by clicking on the "Past Updates" button in the selected goal     . Pharmacy Care Plan: General       CARE PLAN ENTRY  Current Barriers:  . Chronic Disease Management support, education, and care coordination needs related to congestive heart failure  Pharmacist Clinical Goal(s):  Marland Kitchen Over the next 6 months, patient will work with PharmD and primary care provider to address the following goals: o Heart Failure:  See "Dr. Linna Hoff" about options for her worsening heart failure.   Interventions: . Comprehensive medication review performed . Coordinating patient assistance application for [employer] mailed to patient for completion/Dr. [name] to sign.   Patient Self Care Activities:  For the next 6 months until follow up visit:  . Weigh daily first thing in the morning.  . Continue to work on [employer].   Follow-up documentation       Patient verbalizes understanding of instructions provided today.   Telephone follow up appointment with pharmacy team member scheduled for: 06/07/2020 @ 2:30 pm   Sherre Poot, PharmD, [facility] Clinical Pharmacist [facility] [phone] (office) [phone] (mobile)   Heart Failure Eating Plan Heart failure, also called congestive heart failure, occurs when your heart does not pump blood well enough to meet your body's needs for oxygen-rich blood. Heart failure is a long-term (chronic) condition. Living with heart failure can be challenging. However, following your health care provider's instructions about a healthy lifestyle and working with a diet and nutrition specialist (dietitian) to choose the right foods may help to improve your symptoms. What are tips for following this plan? Reading food labels  Check food labels for the amount of sodium per serving. Choose foods that have less than 140 mg (milligrams) of sodium in each serving.  Check food labels for the number of calories per serving. This is important if you need to limit your daily calorie intake to lose weight.  Check food labels  for the serving size. If you eat more than one serving, you will be eating more sodium and calories than what is listed on the label.  Look for foods that are labeled as "sodium-free," "very low sodium," or "low sodium." ? Foods labeled as "reduced sodium" or "lightly salted" may still have more sodium than what is recommended for you. Cooking  Avoid  adding salt when cooking. Ask your health care provider or dietitian before using salt substitutes.  Season food with salt-free seasonings, spices, or herbs. Check the label of seasoning mixes to make sure they do not contain salt.  Cook with heart-healthy oils, such as olive, canola, soybean, or sunflower oil.  Do not fry foods. Cook foods using low-fat methods, such as baking, boiling, grilling, and broiling.  Limit unhealthy fats when cooking by: ? Removing the skin from poultry, such as chicken. ? Removing all visible fats from meats. ? Skimming the fat off from stews, soups, and gravies before serving them. Meal planning   Limit your intake of: ? Processed, canned, or pre-packaged foods. ? Foods that are high in trans fat, such as fried foods. ? Sweets, desserts, sugary drinks, and other foods with added sugar. ? Full-fat dairy products, such as whole milk.  Eat a balanced diet that includes: ? 4-5 servings of fruit each day and 4-5 servings of vegetables each day. At each meal, try to fill half of your plate with fruits and vegetables. ? Up to 6-8 servings of whole grains each day. ? Up to 2 servings of lean meat, poultry, or fish each day. One serving of meat is equal to 3 oz. This is about the same size as a deck of cards. ? 2 servings of low-fat dairy each day. ? Heart-healthy fats. Healthy fats called omega-3 fatty acids are found in foods such as flaxseed and cold-water fish like sardines, salmon, and mackerel.  Aim to eat 25-35 g (grams) of fiber a day. Foods that are high in fiber include apples, broccoli, carrots, beans, peas, and whole grains.  Do not add salt or condiments that contain salt (such as soy sauce) to foods before eating.  When eating at a restaurant, ask that your food be prepared with less salt or no salt, if possible.  Try to eat 2 or more vegetarian meals each week.  Eat more home-cooked food and eat less restaurant, buffet, and fast food. General  information  Do not eat more than 2,300 mg of salt (sodium) a day. The amount of sodium that is recommended for you may be lower, depending on your condition.  Maintain a healthy body weight as directed. Ask your health care provider what a healthy weight is for you. ? Check your weight every day. ? Work with your health care provider and dietitian to make a plan that is right for you to lose weight or maintain your current weight.  Limit how much fluid you drink. Ask your health care provider or dietitian how much fluid you can have each day.  Limit or avoid alcohol as told by your health care provider or dietitian. Recommended foods The items listed may not be a complete list. Talk with your dietitian about what dietary choices are best for you. Fruits All fresh, frozen, and canned fruits. Dried fruits, such as raisins, prunes, and cranberries. Vegetables All fresh vegetables. Vegetables that are frozen without sauce or added salt. Low-sodium or sodium-free canned vegetables. Grains Bread with less than 80 mg of sodium per slice. Whole-wheat  pasta, quinoa, and Aylen Rambert rice. Oats and oatmeal. Barley. [place]. Grits and cream of wheat. Whole-grain and whole-wheat cold cereal. Meats and other protein foods Lean cuts of meat. Skinless chicken and [place]. Fish with high omega-3 fatty acids, such as salmon, sardines, and other cold-water fishes. Eggs. Dried beans, peas, and edamame. Unsalted nuts and nut butters. Dairy Low-fat or nonfat (skim) milk and dried milk. Rice milk, soy milk, and almond milk. Low-fat or nonfat yogurt. Small amounts of reduced-sodium block cheese. Low-sodium cottage cheese. Fats and oils Olive, canola, soybean, flaxseed, or sunflower oil. Avocado. Sweets and desserts Apple sauce. Granola bars. Sugar-free pudding and gelatin. Frozen fruit bars. Seasoning and other foods Fresh and dried herbs. Lemon or lime juice. Vinegar. Low-sodium ketchup. Salt-free marinades, salad  dressings, sauces, and seasonings. The items listed above may not be a complete list of foods and beverages you can eat. Contact a dietitian for more information. Foods to avoid The items listed may not be a complete list. Talk with your dietitian about what dietary choices are best for you. Fruits Fruits that are dried with sodium-containing preservatives. Vegetables Canned vegetables. Frozen vegetables with sauce or seasonings. Creamed vegetables. [place] fries. Onion rings. Pickled vegetables and sauerkraut. Grains Bread with more than 80 mg of sodium per slice. Hot or cold cereal with more than 140 mg sodium per serving. Salted pretzels and crackers. Pre-packaged breadcrumbs. Bagels, croissants, and biscuits. Meats and other protein foods Ribs and chicken wings. Bacon, ham, pepperoni, bologna, salami, and packaged luncheon meats. Hot dogs, bratwurst, and sausage. Canned meat. Smoked meat and fish. Salted nuts and seeds. Dairy Whole milk, half-and-half, and cream. Buttermilk. Processed cheese, cheese spreads, and cheese curds. Regular cottage cheese. Feta cheese. Shredded cheese. String cheese. Fats and oils Butter, lard, shortening, ghee, and bacon fat. Canned and packaged gravies. Seasoning and other foods Onion salt, garlic salt, table salt, and sea salt. Marinades. Regular salad dressings. Relishes, pickles, and olives. Meat flavorings and tenderizers, and bouillon cubes. Horseradish, ketchup, and mustard. Worcestershire sauce. Teriyaki sauce, soy sauce (including reduced sodium). Hot sauce and Tabasco sauce. Steak sauce, fish sauce, oyster sauce, and cocktail sauce. Taco seasonings. Barbecue sauce. Tartar sauce. The items listed above may not be a complete list of foods and beverages you should avoid. Contact a dietitian for more information. Summary  A heart failure eating plan includes changes that limit your intake of sodium and unhealthy fat, and it may help you lose weight or  maintain a healthy weight. Your health care provider may also recommend limiting how much fluid you drink.  Most people with heart failure should eat no more than 2,300 mg of salt (sodium) a day. The amount of sodium that is recommended for you may be lower, depending on your condition.  Contact your health care provider or dietitian before making any major changes to your diet. This information is not intended to replace advice given to you by your health care provider. Make sure you discuss any questions you have with your health care provider. Document Revised: 06/27/2018 Document Reviewed: 09/15/2016 Elsevier Patient Education  [place].

## 2020-03-08 NOTE — Chronic Care Management (AMB) (Signed)
Chronic Care Management Pharmacy Assistant   Name: Ashley [name]  MRN: [id number] DOB: 1945-10-05  Reason for Encounter: Medication Review   PCP : [name], MD  Allergies:   Allergies  Allergen Reactions  . Atorvastatin Other (See Comments)    Severe muscle cramps to generic atorvastatin.  Able to take brand lipitor.  . Chlorhexidine Other (See Comments)    blisters  . Codeine Other (See Comments)    Confusion and dizziness  . Cyclobenzaprine Other (See Comments)    Adverse reaction - back and throat pain, dizziness, exhaustion  . Cymbalta [Duloxetine Hcl] Other (See Comments)    "body spasms and made me feel weird in my head"   . Diflucan [Fluconazole] Nausea And Vomiting  . Dilaudid [Hydromorphone Hcl] Other (See Comments)    Feels like she is burning internally   . 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   . Influenza Vac Split [Flu Virus Vaccine] Other (See Comments)    "allergic to concentrated eggs that are in vaccine"  . Latex Nausea Only, Rash and Other (See Comments)    Pain and infection  . Morphine And Related Other (See Comments)    Internally feels like she is on fire   . Pravastatin Other (See Comments)    myalgias  . Prevacid [Lansoprazole] Other (See Comments)    Worsened GI side effects  . Prevnar [Pneumococcal 13-Val Conj Vacc] Other (See Comments)    Egg allergy Bad reaction after shot  . Rosuvastatin Other (See Comments)    Was not effective controlling lipids  . Simvastatin Other (See Comments)    Leg cramps  . Tape Other (See Comments) and Rash    All adhesives  - Blisters, itching and burning  blistering  . Metaxalone   . Antihistamines, Chlorpheniramine-Type Other (See Comments)    Alters vision  . Betadine [Povidone Iodine] Rash    Per pt.   . Onion Diarrhea and Other (See Comments)    GI distress, flared IBS  . Other Hives and Rash     NOTE: pt is able to take cephalosporins without reaction  . Penicillins Other (See Comments)    CHILDHOOD ALLEGY/REACTION: "told 50 years ago I couldn't take it; might be immune to it", able to take amoxicillin Has patient had a PCN reaction causing immediate rash, facial/tongue/throat swelling, SOB or lightheadedness with hypotension: Unknown Has patient had a PCN reaction causing severe rash involving mucus membranes or skin necrosis: UnknowN Has patient had a PCN reaction that required hospitalization: /Unknown Has patient had a PCN reaction occurring within the last 10 years: Unknown If a  . Red Dye Nausea Only and Swelling  . Sertraline Other (See Comments)    Overly sedating  . Sulfasalazine Other (See Comments)    "makes my whole body smell like sulfa & makes me sick just smelling it"    Medications: Outpatient Encounter Medications as of 03/08/2020  Medication Sig  . acetaminophen (TYLENOL) 500 MG tablet Take 500 mg by mouth daily as needed for moderate pain or headache (PAIN).   . AMBULATORY NON FORMULARY MEDICATION Medication Name:  1 squatty potty to use as needed (Patient not taking: Reported on 03/08/2020)  . Ascorbic Acid (VITAMIN C) 1000 MG tablet Take 1,000 mg by mouth See admin instructions. Take 1000 mg daily, may take an additional 1000 mg as needed for cold symptoms  .  aspirin EC 81 MG tablet Take 81 mg by mouth at bedtime.   . benzonatate (TESSALON) 200 MG capsule Take 1 capsule (200 mg total) by mouth 3 (three) times daily as needed for cough.  . carvedilol (COREG) 6.25 MG tablet Take 0.5 tablets (3.125 mg total) by mouth 2 (two) times daily with a meal.  . cholecalciferol (VITAMIN D) 1000 units tablet Take 1,000 Units by mouth daily.   . clotrimazole (LOTRIMIN) 1 % cream Apply 1 application topically 2 (two) times daily.  . Coenzyme Q10 (COQ-10) 100 MG CAPS Take 100 mg by mouth daily.  Marland Kitchen dextromethorphan (DELSYM) 30 MG/5ML liquid Take 15 mg by mouth daily as needed  for cough.   . diclofenac Sodium (VOLTAREN) 1 % GEL Apply 1 application topically 4 (four) times daily.  Marland Kitchen ezetimibe (ZETIA) 10 MG tablet Take 1 tablet (10 mg total) by mouth daily.  Marland Kitchen GARLIC PO Take 1 capsule by mouth daily.  Marland Kitchen guaiFENesin (MUCINEX) 600 MG 12 hr tablet Take 600 mg by mouth daily.  Marland Kitchen levalbuterol (XOPENEX HFA) 45 MCG/ACT inhaler Inhale 2 puffs into the lungs every 4 (four) hours as needed for wheezing or shortness of breath. (Patient taking differently: Inhale 2 puffs into the lungs every 4 (four) hours as needed for wheezing or shortness of breath. )  . LORazepam (ATIVAN) 0.5 MG tablet Take 0.5-1 tablets (0.25-0.5 mg total) by mouth at bedtime as needed for anxiety or sleep.  . Menthol, Topical Analgesic, (BIOFREEZE ROLL-ON EX) Apply topically.  . miconazole (MICOTIN) 2 % powder Apply 1 application topically as needed (yeast).  . nystatin (MYCOSTATIN) 100000 UNIT/ML suspension Take 5 mLs (500,000 Units total) by mouth 3 (three) times daily.  Marland Kitchen nystatin cream (MYCOSTATIN) Apply 1 application topically as needed for dry skin (yeast infection).  . potassium chloride (KLOR-CON) 10 MEQ tablet Take 1 tablet (10 mEq total) by mouth daily as needed (with laxis).  Marland Kitchen pyridOXINE (VITAMIN B-6) 100 MG tablet Take 100 mg by mouth daily.  . Red Yeast Rice Extract (RED YEAST RICE PO) Take 1 capsule by mouth in the morning and at bedtime.   . sacubitril-valsartan (ENTRESTO) 24-26 MG Take 1 tablet by mouth 2 (two) times daily. (Patient not taking: Reported on 03/08/2020)  . sacubitril-valsartan (ENTRESTO) 49-51 MG Take 0.5 tablets by mouth daily.  Marland Kitchen spironolactone (ALDACTONE) 25 MG tablet Take 1 tablet (25 mg total) by mouth daily.  . Tiotropium Bromide Monohydrate (SPIRIVA RESPIMAT) 1.25 MCG/ACT AERS Inhale 2 puffs into the lungs daily. (Patient not taking: Reported on 03/08/2020)  . torsemide (DEMADEX) 10 MG tablet Take 1 tablet (10 mg total) by mouth every other day.  . triamcinolone (NASACORT)  55 MCG/ACT AERO nasal inhaler Place 2 sprays into the nose daily.  . vitamin E 400 UNIT capsule Take 400 Units by mouth daily.  . [DISCONTINUED] bisoprolol (ZEBETA) 5 MG tablet Take 5 mg by mouth daily.   No facility-administered encounter medications on file as of 03/08/2020.    Current Diagnosis: Patient Active Problem List   Diagnosis Date Noted  . Cervical pain (neck) 02/04/2019  . Bilateral leg numbness 06/01/2018  . Bursitis of right hip 06/01/2018  . Mixed hyperlipidemia 01/17/2018  . Plantar fasciitis, bilateral 12/13/2017  . Acquired renal cyst of right kidney 08/11/2017  . Unsteadiness on feet 07/24/2017  . Multiple rib fractures 06/19/2017  . 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 High total serum IgM 11/22/2016  . 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 Bleeding external hemorrhoids 10/31/2016  .  LBBB (left bundle branch block) 07/11/2016  . Gastritis 06/12/2016  . Fecal incontinence   . Chronic generalized abdominal pain 11/22/2015  . Abdominal aortic atherosclerosis ([place]) 11/13/2015  . 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  . Sore throat 07/12/2015  . Hoarseness 06/21/2015  . Hypersensitivity to pneumococcal vaccine 06/10/2015  . Medicare annual wellness visit, subsequent 06/08/2015  . Advanced care planning/counseling discussion 06/08/2015  . Intertrigo 04/06/2015  . Chronic daily headache 04/06/2015  . Peripheral neuropathy 04/06/2015  . S/P colostomy takedown 02/10/2014  . 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 H/O multiple allergies 10/10/2013  . History of diverticular abscess 09/25/2013  . Osteoporosis 01/13/2013  . Irregular heart beat 08/06/2012  . MDD (major depressive disorder), recurrent episode, moderate ([place]) 07/09/2012  . Chronic recurrent sinusitis 06/17/2012  . Vitamin D deficiency 03/05/2012  . Essential tremor 12/29/2011  . GERD (gastroesophageal reflux disease) 11/13/2011  . Allergic rhinitis 11/13/2011  . CKD (chronic  kidney disease) stage 3, GFR 30-59 ml/min ([facility]) 11/09/2011  . Epistaxis, recurrent 10/11/2011  . Cardiomyopathy, dilated, nonischemic ([place]) 03/01/2011  . Mitral regurgitation 02/24/2011  . Chronic systolic CHF (congestive heart failure) ([place]) 02/24/2011  . Chronic bronchitis ([place]) 01/20/2011  . Syncope 11/02/2010  . Chronic fatigue and malaise 11/02/2010  . Anxiety state 08/18/2010  . 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  . COMMON MIGRAINE 11/22/2006  . Irritable bowel syndrome 11/22/2006  . FIBROCYSTIC BREAST DISEASE 11/22/2006  . Osteoarthritis 11/22/2006  . Fibromyalgia 11/22/2006      Follow-Up:  Patient Assistance Coordination - Filled out new patient assistance application to [employer]  for [employer]. Called patient to let her know that we are mailing application  to her house to complete form, and after she has completed she is to call Dr.Gollan's office to see when she can bring application, proof of income to office ,so they can complete and fax completed form to [employer].  Elly Modena Owens Shark, [place] Notified  Judithann Sheen, [place] Pharmacist Assistant [phone]

## 2020-03-08 NOTE — Chronic Care Management (AMB) (Signed)
Chronic Care Management Pharmacy  Name: Ashley [name]  MRN: [id number] DOB: 1946/04/13  Chief Complaint/ HPI  Ashley [name],  74 y.o., female presents for their Follow-Up CCM visit with the clinical pharmacist via telephone.  PCP : [name], MD  Their chronic conditions include: hyperlipidemia, heart failure, chronic bronchitis, allergic rhinitis, anxiety, MDD   Patient concerns: Her ejection fraction has dropped to 25%. She is being referred to "Dr. Linna Hoff" for additional heart failure evaluation.   Office Visits:  12/05/2019 - prescription clotrimazole for intertigo.   12/01/2019 - AWV with nursing.   Consult Visit:  01/27/2020 - Ortho - oral prednisone to calm down irritated joints post fall. Consider injection if not improved.   01/20/2020 - Ortho - x-rays after fall.   01/08/2020 - Pulmonology - pepcid OTC daily at bedtime. Spiriva 1.25 mcg 2 puffs once daily sample for a trial. .   01/06/2020 - Cariology -continue cardiact rehab. Patient taking Entresto 24/26 mg daily (some limitation due to price).    10/10/2019 - 01/12/2020 -  Cardiac rehab visits.   10/09/2019 - Pulmonology - change QVAR for Arnuity Ellipta daily.   10/06/2019 - Cardiology - lower dose of Entresto 24/26 bid due to orthostasis. .   Allergies  Allergen Reactions  . Atorvastatin Other (See Comments)    Severe muscle cramps to generic atorvastatin.  Able to take brand lipitor.  . Chlorhexidine Other (See Comments)    blisters  . Codeine Other (See Comments)    Confusion and dizziness  . Cyclobenzaprine Other (See Comments)    Adverse reaction - back and throat pain, dizziness, exhaustion  . Cymbalta [Duloxetine Hcl] Other (See Comments)    "body spasms and made me feel weird in my head"   . Diflucan [Fluconazole] Nausea And Vomiting  . Dilaudid [Hydromorphone Hcl] Other (See Comments)    Feels like she is burning internally   . 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   . Influenza Vac Split [Flu Virus Vaccine] Other (See Comments)    "allergic to concentrated eggs that are in vaccine"  . Latex Nausea Only, Rash and Other (See Comments)    Pain and infection  . Morphine And Related Other (See Comments)    Internally feels like she is on fire   . Pravastatin Other (See Comments)    myalgias  . Prevacid [Lansoprazole] Other (See Comments)    Worsened GI side effects  . Prevnar [Pneumococcal 13-Val Conj Vacc] Other (See Comments)    Egg allergy Bad reaction after shot  . Rosuvastatin Other (See Comments)    Was not effective controlling lipids  . Simvastatin Other (See Comments)    Leg cramps  . Tape Other (See Comments) and Rash    All adhesives  - Blisters, itching and burning  blistering  . Metaxalone   . Antihistamines, Chlorpheniramine-Type Other (See Comments)    Alters vision  . Betadine [Povidone Iodine] Rash    Per pt.   . Onion Diarrhea and Other (See Comments)    GI distress, flared IBS  . Other Hives and Rash    NOTE: pt is able to take cephalosporins without reaction  . Penicillins Other (See Comments)    CHILDHOOD ALLEGY/REACTION: "told 50 years ago I couldn't take it; might be immune to it", able to take amoxicillin Has patient had a PCN reaction causing immediate rash, facial/tongue/throat swelling,  SOB or lightheadedness with hypotension: Unknown Has patient had a PCN reaction causing severe rash involving mucus membranes or skin necrosis: UnknowN Has patient had a PCN reaction that required hospitalization: /Unknown Has patient had a PCN reaction occurring within the last 10 years: Unknown If a  . Red Dye Nausea Only and Swelling  . Sertraline Other (See Comments)    Overly sedating  . Sulfasalazine Other (See Comments)    "makes my whole body smell like sulfa & makes me sick just smelling it"   Medications: Outpatient Encounter Medications as  of 03/08/2020  Medication Sig  . carvedilol (COREG) 6.25 MG tablet Take 0.5 tablets (3.125 mg total) by mouth 2 (two) times daily with a meal.  . sacubitril-valsartan (ENTRESTO) 49-51 MG Take 0.5 tablets by mouth daily.  Marland Kitchen acetaminophen (TYLENOL) 500 MG tablet Take 500 mg by mouth daily as needed for moderate pain or headache (PAIN).   . AMBULATORY NON FORMULARY MEDICATION Medication Name:  1 squatty potty to use as needed (Patient not taking: Reported on 03/08/2020)  . Ascorbic Acid (VITAMIN C) 1000 MG tablet Take 1,000 mg by mouth See admin instructions. Take 1000 mg daily, may take an additional 1000 mg as needed for cold symptoms  . aspirin EC 81 MG tablet Take 81 mg by mouth at bedtime.   . benzonatate (TESSALON) 200 MG capsule Take 1 capsule (200 mg total) by mouth 3 (three) times daily as needed for cough.  . cholecalciferol (VITAMIN D) 1000 units tablet Take 1,000 Units by mouth daily.   . clotrimazole (LOTRIMIN) 1 % cream Apply 1 application topically 2 (two) times daily.  . Coenzyme Q10 (COQ-10) 100 MG CAPS Take 100 mg by mouth daily.  Marland Kitchen dextromethorphan (DELSYM) 30 MG/5ML liquid Take 15 mg by mouth daily as needed for cough.   . diclofenac Sodium (VOLTAREN) 1 % GEL Apply 1 application topically 4 (four) times daily.  Marland Kitchen ezetimibe (ZETIA) 10 MG tablet Take 1 tablet (10 mg total) by mouth daily.  Marland Kitchen GARLIC PO Take 1 capsule by mouth daily.  Marland Kitchen guaiFENesin (MUCINEX) 600 MG 12 hr tablet Take 600 mg by mouth daily.  Marland Kitchen levalbuterol (XOPENEX HFA) 45 MCG/ACT inhaler Inhale 2 puffs into the lungs every 4 (four) hours as needed for wheezing or shortness of breath. (Patient taking differently: Inhale 2 puffs into the lungs every 4 (four) hours as needed for wheezing or shortness of breath. )  . LORazepam (ATIVAN) 0.5 MG tablet Take 0.5-1 tablets (0.25-0.5 mg total) by mouth at bedtime as needed for anxiety or sleep.  . Menthol, Topical Analgesic, (BIOFREEZE ROLL-ON EX) Apply topically.  . miconazole  (MICOTIN) 2 % powder Apply 1 application topically as needed (yeast).  . nystatin (MYCOSTATIN) 100000 UNIT/ML suspension Take 5 mLs (500,000 Units total) by mouth 3 (three) times daily.  Marland Kitchen nystatin cream (MYCOSTATIN) Apply 1 application topically as needed for dry skin (yeast infection).  . potassium chloride (KLOR-CON) 10 MEQ tablet Take 1 tablet (10 mEq total) by mouth daily as needed (with laxis).  Marland Kitchen pyridOXINE (VITAMIN B-6) 100 MG tablet Take 100 mg by mouth daily.  . Red Yeast Rice Extract (RED YEAST RICE PO) Take 1 capsule by mouth in the morning and at bedtime.   . sacubitril-valsartan (ENTRESTO) 24-26 MG Take 1 tablet by mouth 2 (two) times daily. (Patient not taking: Reported on 03/08/2020)  . spironolactone (ALDACTONE) 25 MG tablet Take 1 tablet (25 mg total) by mouth daily.  . Tiotropium Bromide Monohydrate (SPIRIVA  RESPIMAT) 1.25 MCG/ACT AERS Inhale 2 puffs into the lungs daily. (Patient not taking: Reported on 03/08/2020)  . torsemide (DEMADEX) 10 MG tablet Take 1 tablet (10 mg total) by mouth every other day.  . triamcinolone (NASACORT) 55 MCG/ACT AERO nasal inhaler Place 2 sprays into the nose daily.  . vitamin E 400 UNIT capsule Take 400 Units by mouth daily.  . [DISCONTINUED] bisoprolol (ZEBETA) 5 MG tablet Take 5 mg by mouth daily.   No facility-administered encounter medications on file as of 03/08/2020.   Allergies  Allergen Reactions  . Atorvastatin Other (See Comments)    Severe muscle cramps to generic atorvastatin.  Able to take brand lipitor.  . Chlorhexidine Other (See Comments)    blisters  . Codeine Other (See Comments)    Confusion and dizziness  . Cyclobenzaprine Other (See Comments)    Adverse reaction - back and throat pain, dizziness, exhaustion  . Cymbalta [Duloxetine Hcl] Other (See Comments)    "body spasms and made me feel weird in my head"   . Diflucan [Fluconazole] Nausea And Vomiting  . Dilaudid [Hydromorphone Hcl] Other (See Comments)    Feels  like she is burning internally   . 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   . Influenza Vac Split [Flu Virus Vaccine] Other (See Comments)    "allergic to concentrated eggs that are in vaccine"  . Latex Nausea Only, Rash and Other (See Comments)    Pain and infection  . Morphine And Related Other (See Comments)    Internally feels like she is on fire   . Pravastatin Other (See Comments)    myalgias  . Prevacid [Lansoprazole] Other (See Comments)    Worsened GI side effects  . Prevnar [Pneumococcal 13-Val Conj Vacc] Other (See Comments)    Egg allergy Bad reaction after shot  . Rosuvastatin Other (See Comments)    Was not effective controlling lipids  . Simvastatin Other (See Comments)    Leg cramps  . Tape Other (See Comments) and Rash    All adhesives  - Blisters, itching and burning  blistering  . Metaxalone   . Antihistamines, Chlorpheniramine-Type Other (See Comments)    Alters vision  . Betadine [Povidone Iodine] Rash    Per pt.   . Onion Diarrhea and Other (See Comments)    GI distress, flared IBS  . Other Hives and Rash    NOTE: pt is able to take cephalosporins without reaction  . Penicillins Other (See Comments)    CHILDHOOD ALLEGY/REACTION: "told 50 years ago I couldn't take it; might be immune to it", able to take amoxicillin Has patient had a PCN reaction causing immediate rash, facial/tongue/throat swelling, SOB or lightheadedness with hypotension: Unknown Has patient had a PCN reaction causing severe rash involving mucus membranes or skin necrosis: UnknowN Has patient had a PCN reaction that required hospitalization: /Unknown Has patient had a PCN reaction occurring within the last 10 years: Unknown If a  . Red Dye Nausea Only and Swelling  . Sertraline Other (See Comments)    Overly sedating  . Sulfasalazine Other (See Comments)    "makes my whole body smell like sulfa & makes  me sick just smelling it"    SDOH Screenings   Alcohol Screen: Low Risk   . Last Alcohol Screening Score (AUDIT): 0  Depression (PHQ2-9): Medium Risk  . PHQ-2 Score: 12  Financial Resource Strain:  Low Risk   . Difficulty of Paying Living Expenses: Not hard at all  Food Insecurity: No Food Insecurity  . Worried About Charity fundraiser in the Last Year: Never true  . Ran Out of Food in the Last Year: Never true  Housing: Low Risk   . Last Housing Risk Score: 0  Physical Activity: Insufficiently Active  . Days of Exercise per Week: 3 days  . Minutes of Exercise per Session: 30 min  Social Connections:   . Frequency of Communication with Friends and Family: Not on file  . Frequency of Social Gatherings with Friends and Family: Not on file  . Attends Religious Services: Not on file  . Active Member of Clubs or Organizations: Not on file  . Attends Archivist Meetings: Not on file  . Marital Status: Not on file  Stress: Stress Concern Present  . Feeling of Stress : Rather much  Tobacco Use: Low Risk   . Smoking Tobacco Use: Never Smoker  . Smokeless Tobacco Use: Never Used  Transportation Needs: No Transportation Needs  . Lack of Transportation (Medical): No  . Lack of Transportation (Non-Medical): No    Current Diagnosis/Assessment:   Emergency planning/management officer Strain: Low Risk   . Difficulty of Paying Living Expenses: Not hard at all    Goals Addressed            This Visit's Progress   . Pharmacy Care Plan: Cholesterol       CARE PLAN ENTRY  Current Barriers:  . Uncontrolled hyperlipidemia . Current antihyperlipidemic regimen:   Red yeast rice - 1 capsule BID  Ezetimibe 10 mg - 1 tablet daily  . Previous antihyperlipidemic medications tried: atorvastatin, pravastatin, simvastatin, rosuvastatin . Most recent lipid panel:     Component Value Date/Time   CHOL 217 (H) 03/18/2019 0933   TRIG 199.0 (H) 03/18/2019 0933   HDL 46.60 03/18/2019 0933   CHOLHDL 5  03/18/2019 0933   VLDL 39.8 03/18/2019 0933   LDLCALC 131 (H) 03/18/2019 0933   LDLDIRECT 164.0 11/27/2018 1407 .  ASCVD risk enhancing conditions: age >62, CHF . 10-year ASCVD risk score: 14.1% (intermediate risk)  Pharmacist Clinical Goal(s):  Marland Kitchen Over the next 6 months, patient will work with PharmD and providers toward the following goals: o Reduce LDL (bad cholesterol) within goal of less than 100 o Improve HDL (good cholesterol) within goal of greater than 50 o Reduce triglycerides within goal of less than 150   Interventions: . Comprehensive medication review performed; medication list updated in electronic medical record.   Patient Self Care Activities:  . Patient will focus on medication adherence by using pillbox and taking medication daily . Focus on heart healthy diet high in fiber, whole grains, lean meats, fruits, vegetables, and fish   Initial goal documentation and Please see past updates related to this goal by clicking on the "Past Updates" button in the selected goal     . Pharmacy Care Plan: General       CARE PLAN ENTRY  Current Barriers:  . Chronic Disease Management support, education, and care coordination needs related to congestive heart failure  Pharmacist Clinical Goal(s):  Marland Kitchen Over the next 6 months, patient will work with PharmD and primary care provider to address the following goals: o Heart Failure: See "Dr. Linna Hoff" about options for her worsening heart failure.   Interventions: . Comprehensive medication review performed . Coordinating patient assistance application for [employer] mailed to patient for completion/Dr. [name] to sign.  Patient Self Care Activities:  For the next 6 months until follow up visit:  . Weigh daily first thing in the morning.  . Continue to work on [employer].   Follow-up documentation      Hyperlipidemia   Lipid Panel     Component Value Date/Time   CHOL 232 (H) 12/01/2019 0941   TRIG 172.0 (H) 12/01/2019  0941   HDL 47.70 12/01/2019 0941   CHOLHDL 5 12/01/2019 0941   VLDL 34.4 12/01/2019 0941   LDLCALC 149 (H) 12/01/2019 0941   LDLDIRECT 164.0 11/27/2018 1407    CBC Latest Ref Rng & Units 12/01/2019 08/31/2019 06/27/2019  WBC 4.0 - 10.5 K/uL 8.5 12.9(H) 8.4  Hemoglobin 12.0 - 15.0 g/dL 12.8 13.7 12.7  Hematocrit 36 - 46 % 38.3 42.0 38.8  Platelets 150 - 400 K/uL 218.0 211 203.0   The 10-year ASCVD risk score Mikey Bussing DC Jr., et al., 2013) is: 14.2%   Values used to calculate the score:     Age: 78 years     Sex: Female     Is Non-Hispanic African American: No     Diabetic: No     Tobacco smoker: No     Systolic Blood Pressure: 098 mmHg     Is BP treated: Yes     HDL Cholesterol: 47.7 mg/dL     Total Cholesterol: 232 mg/dL   LDL goal < 100 Patient has failed these meds in past: multiple statins (muscle cramps/ineffective in controlling lipids) Patient is currently uncontrolled on the following medications:   Red yeast rice - 1 capsule BID  Ezetimibe 10 mg - 1 tablet daily   CoQ10 100 mg - 1 tablet daily   Aspirin 81 mg - 1 tablet daily  Patient reports that she has been unable to tolerate statins in the past. Reports that she gained 10 lbs during cardiac rehab. Reports good adherence to Zetia. Fill history indicates good adherence to Zetia.   Plan: Continue current medications; Focus on heart healthy diet   Heart Failure   Type: Systolic  Last ejection fraction: 02/2019: EF 25% NYHA Class: IV (significant limitation of activity / dyspnea at rest) AHA HF Stage: C (Heart disease and symptoms present)  Patient has failed these meds in past: none reported Patient is currently uncontrolled on the following medications:   Spironolactone 25 mg - 1 tablet every other day  Carvedilol 3.125 mg - 1/2 tablet BID  Entresto 49-51 mg - taking 1/2 once daily   Torsemide 10 mg - 1 tablet every other day  Potassium chloride 10 mEq - 1 tablet daily PRN (with torsemide)  We  discussed: reports BP usually runs around 112/59 mmHg; weighing daily, if weight gain of more than 3 lbs overnight or 5 lbs in 1 week take torsemide until weight resolved. Patient gained 10 lbs during cardiac rehab but hasn't been able to lose it yet. Patient reports that her ejection fraction has dropped after trying Entresto, cardiac rehab and adding additional wires to her defibrillator or pacemaker. She is being referred to "Dr. Linna Hoff" for evaluation.   Affordability - Patient reports that [name] is expensive for her. We discussed the option of patient assistance. Patient states she was denies in years past. Pharmacist reviewed the income parameters listed online and patient indicates she would be eligible based on the 600% FPL income. Patient will complete her section of the application and take to Dr. Donivan Scull office for signature.   Plan: Continue current medications. Complete [name]  patient assistance.   Chronic Bronchitis    Last spirometry score: 03/2011 FEV-1 69% Gold Grade: Gold 2 (FEV1 50-79%)   Eosinophil count:   Lab Results  Component Value Date/Time   EOSPCT 1.4 12/01/2019 09:41 AM   EOSPCT 1.4 02/14/2017 11:15 AM  %                               Eos (Absolute):  Lab Results  Component Value Date/Time   EOSABS 0.1 12/01/2019 09:41 AM   EOSABS 0.1 02/14/2017 11:15 AM   Tobacco Status:  Social History   Tobacco Use  Smoking Status Never Smoker  Smokeless Tobacco Never Used  Tobacco Comment   Lived with smokers x 23 yrs, smoked herself "for a week"   Patient has failed these meds in past: Advair, Spiriva no symptomatic relief  Patient is currently controlled on the following medications:   Beclomethasone (QVAR ) 80 mcg/act - 2 puffs in lungs BID PRN  First Dukes Mouthwash - every 6 hours PRN   Xopenex 45 mcg -  2 puffs q4h PRN  Albuterol inhaler - PRN  Allergies/Cough:  Benzonatate 200 mg - 1 capsule TID PRN cough (takes maybe 2 a week)  Nasacort 55 mcg  - 2 sprays in nose daily every morning  Nasal saline spray - 1 spray in each nostril qhs  Guaifenesin 600 mg - 1 tablet twice daily  Using maintenance inhaler regularly? No (prescribed PRN) Rescue inhaler: takes Xopenex 2-3 times a week  We discussed: Patient reports that she is using QVAR 3-4 times each week. Patient reports that Spiriva and Anoro did not help her and unable to tolerate oral irritation. Reports using Xopenex as needed due to less palpitations.   Plan: Continue current medications   Vaccines   Reviewed and discussed patient's vaccination history.    Not planning to get COVID vaccines at this time. She wears a mask and shield everywhere she goes.   Immunization History  Administered Date(s) Administered  . Pneumococcal Conjugate-13 06/08/2015  . Pneumococcal Polysaccharide-23 02/16/2012  . Td 11/12/2005   Plan: Unable to take vaccines due to history of severe allergies.   Medication Management  OTCs: vitamin E 400 units - daily, vitamin B6 100 mg - 1 tablet daily, nystatin cream PRN (needs refill - applies to abdomen/scar), miconazole 2% powder - PRN, garlic PO daily, diclofenac gel 1% - apply QID PRN - ineffective/not taking, vitamin D 1000 IU - daily, Biotin (not taking), MiraLAX PRN, vitamin C 1000 mg daily, Tylenol 500 mg, Biofreeze roll on prn 3-4x,   Pharmacy/Benefits: [facility] [address]   Affordability: applying for Entresto PAP with cardiology; insurance [facility]   CCM Follow Up: 06/07/2020  at 2:30 PM (telephone)  Sherre Poot, PharmD, [facility] Clinical Pharmacist [facility] [phone] (office) [phone] (mobile)

## 2020-03-09 ENCOUNTER — Telehealth: Payer: Self-pay | Admitting: Family Medicine

## 2020-03-09 MED ORDER — NYSTATIN 100000 UNIT/ML MT SUSP: 5.0000 mL | Freq: Three times a day (TID) | OROMUCOSAL | 0 refills | Status: DC

## 2020-03-09 NOTE — Telephone Encounter (Signed)
Nystatin swish/swallow refilled to use three times daily as needed.  I am not able to send in magic mouthwash/duke's mouthwash - looks like it has been discontinued. Would have her pick up chloraseptic spray OTC to use instead.  rec OV if not improving with this.
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
## 2020-03-09 NOTE — Addendum Note (Signed)
Addended by: [name] on: 03/09/2020 11:57 AM   Modules accepted: Orders

## 2020-03-09 NOTE — Telephone Encounter (Signed)
Lvm asking pt to call back.  Need to schedule OV (or virtual, depending on sxs).

## 2020-03-09 NOTE — Telephone Encounter (Signed)
Lvm asking pt to call back.  Need to relay Dr. G's message.  

## 2020-03-09 NOTE — Telephone Encounter (Signed)
Pt called in due to she had a scratchy throat and she has heart failure, and she keep tasting the goat cheese that was in the spaghetti sauce and she thinks its an allergic reaction or thrush and broncial asthma and she is at 25% And the last time Dr. Darnell Level gave her a dukes magic mouth wash and nystatin 10000 units ml . Please advise   Best contact number [phone]

## 2020-03-09 NOTE — Progress Notes (Signed)
I have collaborated with the care management provider regarding care management and care coordination activities outlined in this encounter and have reviewed this encounter including documentation in the note and care plan. I am certifying that I agree with the content of this note and encounter as supervising physician.  

## 2020-03-10 NOTE — Telephone Encounter (Signed)
Noted.  FYI to Dr. G. 

## 2020-03-10 NOTE — Telephone Encounter (Signed)
Pt called this morning . Related the message. And she stated she was having ear pain and drainage, she is going to call her ENT.

## 2020-03-17 DIAGNOSIS — L570 Actinic keratosis: Secondary | ICD-10-CM | POA: Diagnosis not present

## 2020-03-17 DIAGNOSIS — L821 Other seborrheic keratosis: Secondary | ICD-10-CM | POA: Diagnosis not present

## 2020-03-17 DIAGNOSIS — H2513 Age-related nuclear cataract, bilateral: Secondary | ICD-10-CM | POA: Diagnosis not present

## 2020-03-17 DIAGNOSIS — L918 Other hypertrophic disorders of the skin: Secondary | ICD-10-CM | POA: Diagnosis not present

## 2020-03-20 ENCOUNTER — Encounter: Payer: Self-pay | Admitting: Family Medicine

## 2020-03-20 DIAGNOSIS — M8588 Other specified disorders of bone density and structure, other site: Secondary | ICD-10-CM | POA: Diagnosis not present

## 2020-03-22 ENCOUNTER — Encounter: Payer: Self-pay | Admitting: Family Medicine

## 2020-03-22 ENCOUNTER — Other Ambulatory Visit: Payer: Self-pay

## 2020-03-22 ENCOUNTER — Ambulatory Visit (INDEPENDENT_AMBULATORY_CARE_PROVIDER_SITE_OTHER): Payer: Medicare Other | Admitting: Family Medicine

## 2020-03-22 ENCOUNTER — Telehealth: Payer: Self-pay | Admitting: Cardiovascular Disease

## 2020-03-22 VITALS — BP 120/64 | HR 94 | Temp 97.6°F | Ht 66.5 in | Wt 182.0 lb

## 2020-03-22 DIAGNOSIS — M25561 Pain in right knee: Secondary | ICD-10-CM | POA: Diagnosis not present

## 2020-03-22 DIAGNOSIS — R5381 Other malaise: Secondary | ICD-10-CM | POA: Diagnosis not present

## 2020-03-22 DIAGNOSIS — J309 Allergic rhinitis, unspecified: Secondary | ICD-10-CM | POA: Diagnosis not present

## 2020-03-22 DIAGNOSIS — H9201 Otalgia, right ear: Secondary | ICD-10-CM

## 2020-03-22 DIAGNOSIS — R5382 Chronic fatigue, unspecified: Secondary | ICD-10-CM | POA: Diagnosis not present

## 2020-03-22 DIAGNOSIS — M1711 Unilateral primary osteoarthritis, right knee: Secondary | ICD-10-CM | POA: Diagnosis not present

## 2020-03-22 DIAGNOSIS — E559 Vitamin D deficiency, unspecified: Secondary | ICD-10-CM

## 2020-03-22 DIAGNOSIS — Z95810 Presence of automatic (implantable) cardiac defibrillator: Secondary | ICD-10-CM | POA: Diagnosis not present

## 2020-03-22 DIAGNOSIS — F331 Major depressive disorder, recurrent, moderate: Secondary | ICD-10-CM

## 2020-03-22 DIAGNOSIS — J029 Acute pharyngitis, unspecified: Secondary | ICD-10-CM | POA: Diagnosis not present

## 2020-03-22 DIAGNOSIS — I420 Dilated cardiomyopathy: Secondary | ICD-10-CM

## 2020-03-22 DIAGNOSIS — I5022 Chronic systolic (congestive) heart failure: Secondary | ICD-10-CM | POA: Diagnosis not present

## 2020-03-22 DIAGNOSIS — M7061 Trochanteric bursitis, right hip: Secondary | ICD-10-CM | POA: Diagnosis not present

## 2020-03-22 DIAGNOSIS — N1831 Chronic kidney disease, stage 3a: Secondary | ICD-10-CM

## 2020-03-22 DIAGNOSIS — J418 Mixed simple and mucopurulent chronic bronchitis: Secondary | ICD-10-CM

## 2020-03-22 DIAGNOSIS — M25551 Pain in right hip: Secondary | ICD-10-CM | POA: Diagnosis not present

## 2020-03-22 LAB — BASIC METABOLIC PANEL
BUN: 23 mg/dL (ref 6–23)
CO2: 27 mEq/L (ref 19–32)
Calcium: 10.2 mg/dL (ref 8.4–10.5)
Chloride: 102 mEq/L (ref 96–112)
Creatinine, Ser: 1.07 mg/dL (ref 0.40–1.20)
GFR: 51.11 mL/min — ABNORMAL LOW (ref >60.00)
Glucose, Bld: 100 mg/dL — ABNORMAL HIGH (ref 70–99)
Potassium: 4.5 mEq/L (ref 3.5–5.1)
Sodium: 138 mEq/L (ref 135–145)

## 2020-03-22 MED ORDER — NYSTATIN 100000 UNIT/ML MT SUSP
5.0000 mL | Freq: Three times a day (TID) | OROMUCOSAL | 1 refills | Status: DC
Start: 2020-03-22 — End: 2020-12-03

## 2020-03-22 MED ORDER — LEVALBUTEROL TARTRATE 45 MCG/ACT IN AERO: 2.0000 | INHALATION_SPRAY | RESPIRATORY_TRACT | 3 refills | Status: DC | PRN

## 2020-03-22 MED ORDER — BENZONATATE 200 MG PO CAPS: 200.0000 mg | ORAL_CAPSULE | Freq: Three times a day (TID) | ORAL | 3 refills | Status: DC | PRN

## 2020-03-22 MED ORDER — TRIAMCINOLONE ACETONIDE 55 MCG/ACT NA AERO: 2.0000 | INHALATION_SPRAY | NASAL | Status: DC

## 2020-03-22 MED ORDER — LORAZEPAM 0.5 MG PO TABS
0.2500 mg | ORAL_TABLET | Freq: Every evening | ORAL | 1 refills | Status: DC | PRN
Start: 2020-03-22 — End: 2022-01-01

## 2020-03-22 MED ORDER — QVAR REDIHALER 80 MCG/ACT IN AERB: 2.0000 | INHALATION_SPRAY | Freq: Two times a day (BID) | RESPIRATORY_TRACT | 3 refills | Status: DC

## 2020-03-22 NOTE — Progress Notes (Signed)
This visit was conducted in person.  BP 120/64 (BP Location: Left Arm, Patient Position: Sitting, Cuff Size: Normal)   Pulse 94   Temp 97.6 F (36.4 C) (Temporal)   Ht 5' 6.5" (1.689 m)   Wt 182 lb (82.6 kg)   SpO2 97%   BMI 28.94 kg/m    CC: med f/u visit  Subjective:    Patient ID: Ashley [name], female    DOB: 02-Jan-1946, 74 y.o.   MRN: [id number]  HPI: Ashley [name] is a 74 y.o. female presenting on 03/22/2020 for Follow-up (Here for med f/u.)   Out of lorazepam - requests refill.   Called in 2 wks ago with ear pain, drainage, sore throat - treated with nystatin swish/swallow. She did not see ENT. Warm water gargles and nystatin helped.    Dropping EF - has been referred to Dr [name] for further evaluation/management. Latest EF 25% last month. Doesn't think she would want heart pump surgery. She completed cardiac rehab - actually felt worse with this. She also endorse 12 lb weight gain so she has increased torsemide to 10mg  daily over the past month - also continues spironolactone and potassium daily (takes potassium with torsemide).   She had a fall 01/2020 saw ortho s/p steroid course, xrays without fracture.   Chronic bronchitis - managed with Qvar 3-4x/wk, and PRN xopenex. Did not tolerate spiriva or anoro elipta. Continues tessalon perls, nasacort, nasal saline and guaifenesin.   New [employer] pt assistance application form recently filled out for [employer].   Known chronic fatigue, fibromyalgia, deconditioning.      Relevant past medical, surgical, family and social history reviewed and updated as indicated. Interim medical history since our last visit reviewed. Allergies and medications reviewed and updated. Outpatient Medications Prior to Visit  Medication Sig Dispense Refill  . acetaminophen (TYLENOL) 500 MG tablet Take 500 mg by mouth daily as needed for moderate pain or headache (PAIN).     . AMBULATORY NON FORMULARY MEDICATION Medication Name:  1  squatty potty to use as needed 1 each 0  . Ascorbic Acid (VITAMIN C) 1000 MG tablet Take 1,000 mg by mouth See admin instructions. Take 1000 mg daily, may take an additional 1000 mg as needed for cold symptoms    . aspirin EC 81 MG tablet Take 81 mg by mouth at bedtime.     . carvedilol (COREG) 6.25 MG tablet Take 0.5 tablets (3.125 mg total) by mouth 2 (two) times daily with a meal. 45 tablet 3  . cholecalciferol (VITAMIN D) 1000 units tablet Take 1,000 Units by mouth daily.     . Coenzyme Q10 (COQ-10) 100 MG CAPS Take 100 mg by mouth daily.    Marland Kitchen dextromethorphan (DELSYM) 30 MG/5ML liquid Take 15 mg by mouth daily as needed for cough.     . diclofenac Sodium (VOLTAREN) 1 % GEL Apply 1 application topically 4 (four) times daily.    Marland Kitchen ezetimibe (ZETIA) 10 MG tablet Take 1 tablet (10 mg total) by mouth daily. 90 tablet 3  . GARLIC PO Take 1 capsule by mouth daily.    Marland Kitchen guaiFENesin (MUCINEX) 600 MG 12 hr tablet Take 600 mg by mouth daily.    . Menthol, Topical Analgesic, (BIOFREEZE ROLL-ON EX) Apply topically.    . miconazole (MICOTIN) 2 % powder Apply 1 application topically as needed (yeast).    . nystatin cream (MYCOSTATIN) Apply 1 application topically as needed for dry skin (yeast infection). 30 g 3  .  potassium chloride (KLOR-CON) 10 MEQ tablet Take 1 tablet (10 mEq total) by mouth daily as needed (with laxis). 90 tablet 3  . pyridOXINE (VITAMIN B-6) 100 MG tablet Take 100 mg by mouth daily.    . Red Yeast Rice Extract (RED YEAST RICE PO) Take 1 capsule by mouth in the morning and at bedtime.     . sacubitril-valsartan (ENTRESTO) 24-26 MG Take 1 tablet by mouth 2 (two) times daily. 180 tablet 3  . sacubitril-valsartan (ENTRESTO) 49-51 MG Take 0.5 tablets by mouth daily.    Marland Kitchen torsemide (DEMADEX) 10 MG tablet Take 1 tablet (10 mg total) by mouth every other day. 45 tablet 3  . vitamin E 400 UNIT capsule Take 400 Units by mouth daily.    . benzonatate (TESSALON) 200 MG capsule Take 1 capsule  (200 mg total) by mouth 3 (three) times daily as needed for cough. 30 capsule 3  . levalbuterol (XOPENEX HFA) 45 MCG/ACT inhaler Inhale 2 puffs into the lungs every 4 (four) hours as needed for wheezing or shortness of breath. (Patient taking differently: Inhale 2 puffs into the lungs every 4 (four) hours as needed for wheezing or shortness of breath. ) 1 Inhaler 3  . LORazepam (ATIVAN) 0.5 MG tablet Take 0.5-1 tablets (0.25-0.5 mg total) by mouth at bedtime as needed for anxiety or sleep. 30 tablet 1  . nystatin (MYCOSTATIN) 100000 UNIT/ML suspension Take 5 mLs (500,000 Units total) by mouth 3 (three) times daily. 120 mL 0  . Tiotropium Bromide Monohydrate (SPIRIVA RESPIMAT) 1.25 MCG/ACT AERS Inhale 2 puffs into the lungs daily. 4 g 0  . triamcinolone (NASACORT) 55 MCG/ACT AERO nasal inhaler Place 2 sprays into the nose daily.    Marland Kitchen spironolactone (ALDACTONE) 25 MG tablet Take 1 tablet (25 mg total) by mouth daily. 90 tablet 3  . clotrimazole (LOTRIMIN) 1 % cream Apply 1 application topically 2 (two) times daily. 60 g 1   No facility-administered medications prior to visit.     Per HPI unless specifically indicated in ROS section below Review of Systems Objective:  BP 120/64 (BP Location: Left Arm, Patient Position: Sitting, Cuff Size: Normal)   Pulse 94   Temp 97.6 F (36.4 C) (Temporal)   Ht 5' 6.5" (1.689 m)   Wt 182 lb (82.6 kg)   SpO2 97%   BMI 28.94 kg/m   Wt Readings from Last 3 Encounters:  03/22/20 182 lb (82.6 kg)  01/12/20 184 lb (83.5 kg)  01/08/20 182 lb 12.8 oz (82.9 kg)      Physical Exam Vitals and nursing note reviewed.  Constitutional:      Appearance: Normal appearance. She is not ill-appearing.  HENT:     Right Ear: Hearing, tympanic membrane and ear canal normal.     Left Ear: Hearing, tympanic membrane and ear canal normal.     Ears:     Comments: Small amount of cerumen to bilateral ear canals, removed on left, unable to remove on right despite alligator  forceps  Cardiovascular:     Rate and Rhythm: Normal rate and regular rhythm.     Pulses: Normal pulses.     Heart sounds: Normal heart sounds. No murmur heard.   Pulmonary:     Effort: Pulmonary effort is normal. No respiratory distress.     Breath sounds: Normal breath sounds. No wheezing, rhonchi or rales.  Musculoskeletal:     Right lower leg: Edema (tr) present.     Left lower leg: Edema (tr) present.  Skin:    General: Skin is warm and dry.     Findings: No rash.  Neurological:     Mental Status: She is alert.  Psychiatric:        Mood and Affect: Mood normal.        Behavior: Behavior normal.       Results for orders placed or performed in visit on 59/93/57  Basic metabolic panel  Result Value Ref Range   Sodium 138 135 - 145 mEq/L   Potassium 4.5 3.5 - 5.1 mEq/L   Chloride 102 96 - 112 mEq/L   CO2 27 19 - 32 mEq/L   Glucose, Bld 100 (H) 70 - 99 mg/dL   BUN 23 6 - 23 mg/dL   Creatinine, Ser 1.07 0.40 - 1.20 mg/dL   GFR 51.11 (L) >60.00 mL/min   Calcium 10.2 8.4 - 10.5 mg/dL   *Note: Due to a large number of results and/or encounters for the requested time period, some results have not been displayed. A complete set of results can be found in Results Review.   Assessment & Plan:  This visit occurred during the SARS-CoV-2 public health emergency.  Safety protocols were in place, including screening questions prior to the visit, additional usage of staff PPE, and extensive cleaning of exam room while observing appropriate contact time as indicated for disinfecting solutions.   Problem List Items Addressed This Visit    Sore throat    Did not see ENT for recent ear ache or ST.  She did take nystatin swish/swallow with benefit.  Discussed chloraseptic spray - she does not do well with this.  It seems [facility]/magic mouthwash is no longer available.       MDD (major depressive disorder), recurrent episode, moderate (HCC)    Only on PRN lorazepam - sparing use. Has not  tolerated cymbalta or sertraline well in the past.       Relevant Medications   LORazepam (ATIVAN) 0.5 MG tablet   Ear pain, right    Exam overall normal. L cerumen removed, unable to remove R cerumen. TMs pearly grey. She may f/u with ENT if worsening.       CKD (chronic kidney disease) stage 3, GFR 30-59 ml/min (HCC)    Update BMP.       Relevant Orders   Basic metabolic panel (Completed)   Chronic systolic CHF (congestive heart failure) ([place]) - Primary    Appreciate cards care. EF dropped to 25%.  Pending eval by Dr [name].       Chronic fatigue and malaise    Ongoing, attributes to worsening EF.       Chronic bronchitis ([place])    Appreciate pulm care. Pt states was told she does not have COPD.  Doing well with qvar 3-4 times a week with PRN xopenex.  Also continues tessalon perls, nasacort, and nasal saline and guaifenesin.  Suggested trying QOD nasacort as it may be just as effective.       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          Meds ordered this encounter  Medications  . nystatin (MYCOSTATIN) 100000 UNIT/ML suspension    Sig: Take 5 mLs (500,000 Units total) by mouth 3 (three) times daily.    Dispense:  120 mL    Refill:  1  . benzonatate (TESSALON) 200 MG capsule    Sig: Take 1 capsule (  200 mg total) by mouth 3 (three) times daily as needed for cough.    Dispense:  30 capsule    Refill:  3  . beclomethasone (QVAR REDIHALER) 80 MCG/ACT inhaler    Sig: Inhale 2 puffs into the lungs 2 (two) times daily.    Dispense:  1 each    Refill:  3  . levalbuterol (XOPENEX HFA) 45 MCG/ACT inhaler    Sig: Inhale 2 puffs into the lungs every 4 (four) hours as needed for wheezing or shortness of breath.    Dispense:  15 g    Refill:  3  . LORazepam (ATIVAN) 0.5 MG tablet    Sig: Take 0.5-1 tablets (0.25-0.5 mg total) by mouth at bedtime as  needed for anxiety or sleep.    Dispense:  30 tablet    Refill:  1  . triamcinolone (NASACORT) 55 MCG/ACT AERO nasal inhaler    Sig: Place 2 sprays into the nose every Monday, Wednesday, and Friday.   Orders Placed This Encounter  Procedures  . Basic metabolic panel    Patient Instructions  Try decreasing frequency of nasacort and see if you keep same effect. May continue nasal saline irrigation throughout the day.  Continue limiting fluids to <64 oz.  Return in 4 months for follow up visit.  Labs today    Follow up plan: Return in about 4 months (around 07/20/2020) for follow up visit.  [name], MD

## 2020-03-22 NOTE — Telephone Encounter (Signed)
Patient has also not received entresto assistance information .

## 2020-03-22 NOTE — Patient Instructions (Addendum)
Try decreasing frequency of nasacort and see if you keep same effect. May continue nasal saline irrigation throughout the day.  Continue limiting fluids to <64 oz.  Return in 4 months for follow up visit.  Labs today

## 2020-03-22 NOTE — Telephone Encounter (Signed)
Patient wants some advise on not being able to get in touch with or to get an appt with advanced heart failure clinic.  Please call.

## 2020-03-23 NOTE — Assessment & Plan Note (Signed)
Did not see ENT for recent ear ache or ST.  She did take nystatin swish/swallow with benefit.  Discussed chloraseptic spray - she does not do well with this.  It seems [facility]/magic mouthwash is no longer available.

## 2020-03-23 NOTE — Assessment & Plan Note (Signed)
Exam overall normal. L cerumen removed, unable to remove R cerumen. TMs pearly grey. She may f/u with ENT if worsening.

## 2020-03-23 NOTE — Assessment & Plan Note (Signed)
Only on PRN lorazepam - sparing use. Has not tolerated cymbalta or sertraline well in the past.

## 2020-03-23 NOTE — Assessment & Plan Note (Signed)
Ongoing, attributes to worsening EF.

## 2020-03-23 NOTE — Assessment & Plan Note (Signed)
Update BMP.  

## 2020-03-23 NOTE — Assessment & Plan Note (Signed)
Appreciate cards care. EF dropped to 25%.  Pending eval by Dr [name].

## 2020-03-23 NOTE — Assessment & Plan Note (Signed)
Appreciate pulm care. Pt states was told she does not have COPD.  Doing well with qvar 3-4 times a week with PRN xopenex.  Also continues tessalon perls, nasacort, and nasal saline and guaifenesin.  Suggested trying QOD nasacort as it may be just as effective.

## 2020-03-23 NOTE — Assessment & Plan Note (Signed)
Discussed nasacort use.

## 2020-03-24 NOTE — Telephone Encounter (Signed)
Spoke with patient and she did not receive any application that her pharmacist was assisting her with  and was supposed to receive. She also wanted provider to know that she also stopped her torsemide and potassium as well due to some kidney issues. Advised we could assist with this and we would call to let her know when to come pick up.

## 2020-03-29 NOTE — Telephone Encounter (Signed)
Spoke to pt, notified I have completed [employer] pt assistance form (physician portion complete) and left at front desk at the [place] office for her to pick up. Pt states she will pick up tomorrow and is very appreciative. Pt also states she is waiting for appt to be scheduled to see Dr. [name]. Pt says she has called their office w/ number provided below and has left two messages. Our scheduling has reached out to get this appt set up, awaiting to hear back. Notified pt she will be contacted regarding this referral.

## 2020-03-31 ENCOUNTER — Telehealth: Payer: Self-pay | Admitting: Cardiovascular Disease

## 2020-03-31 MED ORDER — CLINDAMYCIN HCL 300 MG PO CAPS: 600.0000 mg | ORAL_CAPSULE | Freq: Once | ORAL | 0 refills | Status: AC

## 2020-03-31 NOTE — Telephone Encounter (Signed)
Patient notified and aware of the prescription being sent to her pharmacy. Rx sent.

## 2020-03-31 NOTE — Telephone Encounter (Signed)
Patient came into the lobby to talk to Dr [name] and get antibiotic for dental procedure later today. Dr [name] reviewed briefly and advised he will call in a prescription later this morning after seeing patient's. Ashley [name] will relay this information to patient. Routing to Dr [name].

## 2020-03-31 NOTE — Telephone Encounter (Signed)
Patient calling  States she broke a tooth and is having pain and swelling She will be going to the dentist this afternoon and needs to know what antibiotic to use Patient has problems with Amoxicillin but was told Levaquin could have interactions with other medications Patient emphasized several times that she is in a time crunch and needs to know ASAP Please call to discuss

## 2020-03-31 NOTE — Telephone Encounter (Signed)
We can call in clindamycin 600 mg x1,  To take 1 hr prior to procedure

## 2020-03-31 NOTE — Telephone Encounter (Signed)
Patient pharmacy [facility] on [address]

## 2020-04-01 ENCOUNTER — Telehealth: Payer: Self-pay | Admitting: Cardiovascular Disease

## 2020-04-01 NOTE — Telephone Encounter (Signed)
This looks to be a request for 2 different procedure one dental and one cataract. I am going to reach out the pt to verify this. If so we need clearance request from both offices, as this looks to be from the pt. Procedure information will need to verified by doctors offices.

## 2020-04-01 NOTE — Telephone Encounter (Signed)
Patient returning call, wanted to let Arbie Cookey know that she received her call and is gathering the information and will call back in tomorrow with it.

## 2020-04-01 NOTE — Telephone Encounter (Signed)
   [facility] Pre-operative Risk Assessment    HEARTCARE STAFF: - Please ensure there is not already an duplicate clearance open for this procedure. - Under Visit Info/Reason for Call, type in Other and utilize the format Clearance MM/DD/YY or Clearance TBD. Do not use dashes or single digits. - If request is for dental extraction, please clarify the # of teeth to be extracted.  Request for surgical clearance:  1. What type of surgery is being performed? Dental molar crown and R Eye Cataract Removal   2. When is this surgery scheduled? TBD  3. What type of clearance is required (medical clearance vs. Pharmacy clearance to hold med vs. Both)? Both   4. Are there any medications that need to be held prior to surgery and how long? Please advise   5. Practice name and name of physician performing surgery? Patient calling    6. What is the office phone number?  [phone] (patient)    7.   What is the office fax number? Unknown   8.   Anesthesia type (None, local, MAC, general) ? Unknown    [name] 04/01/2020, 9:15 AM  _________________________________________________________________   (provider comments below)

## 2020-04-01 NOTE — Telephone Encounter (Signed)
Agreed! I assumed clearance request was from operating physician  Kerin Ransom PA-C 04/01/2020 2:33 PM

## 2020-04-01 NOTE — Telephone Encounter (Signed)
Left a message for the pt to call back as we need to confirm procedure. The clearance information taken earlier by Marissa Calamity was taken by the pt. We need to confirm clearance request with the providers who will be doing procedures and not from the pt. I have left a message to please call the office so that we may get the information of who are the providers doing procedures so we may obtain complete clearance request information needed.

## 2020-04-01 NOTE — Telephone Encounter (Signed)
   Primary Cardiologist: [name], MD  Chart reviewed as part of pre-operative protocol coverage. Cataract extractions are recognized in guidelines as low risk surgeries that do not typically require specific preoperative testing or holding of blood thinner therapy. Therefore, given past medical history and time since last visit, based on ACC/AHA guidelines, Ashley [name] would be at acceptable risk for the planned procedure without further cardiovascular testing.   I will route this recommendation to the requesting party via Epic fax function and remove from pre-op pool.  Please call with questions.  Kerin Ransom, PA-C 04/01/2020, 9:42 AM

## 2020-04-06 ENCOUNTER — Telehealth: Payer: Self-pay | Admitting: Cardiovascular Disease

## 2020-04-06 ENCOUNTER — Encounter: Payer: Self-pay | Admitting: Cardiovascular Disease

## 2020-04-06 NOTE — Telephone Encounter (Signed)
   Primary Cardiologist: [name], MD  Chart reviewed as part of pre-operative protocol coverage. Cataract extractions are recognized in guidelines as low risk surgeries that do not typically require specific preoperative testing or holding of blood thinner therapy. Therefore, given past medical history and time since last visit, based on ACC/AHA guidelines, Ashley [name] would be at acceptable risk for the planned procedure without further cardiovascular testing.   I will route this recommendation to the requesting party via Epic fax function and remove from pre-op pool.  Please call with questions.  Darreld Mclean, PA-C 04/06/2020, 10:03 AM

## 2020-04-06 NOTE — Telephone Encounter (Signed)
   Primary Cardiologist: [name], MD  Chart reviewed as part of pre-operative protocol coverage.   Simple dental extractions are considered low risk procedures per guidelines and generally do not require any specific cardiac clearance. It is also generally accepted that for simple extractions and dental cleanings, there is no need to interrupt blood thinner therapy.  SBE prophylaxis is not required for the patient from a cardiac standpoint.  I will route this recommendation to the requesting party via Epic fax function and remove from pre-op pool.  Please call with questions.  Darreld Mclean, PA-C 04/06/2020, 2:35 PM

## 2020-04-06 NOTE — Telephone Encounter (Signed)
[facility] is calling back stating they received a fax from our office, but was not legible. Please call to discuss.

## 2020-04-06 NOTE — Telephone Encounter (Signed)
This is a duplicate request. Please see clearance from 04/01/2020. Will remove from pre-op pool.  Darreld Mclean, PA-C 04/06/2020 10:35 AM

## 2020-04-06 NOTE — Telephone Encounter (Signed)
   [facility] Pre-operative Risk Assessment    HEARTCARE STAFF: - Please ensure there is not already an duplicate clearance open for this procedure. - Under Visit Info/Reason for Call, type in Other and utilize the format Clearance MM/DD/YY or Clearance TBD. Do not use dashes or single digits. - If request is for dental extraction, please clarify the # of teeth to be extracted.  Request for surgical clearance: THIS IS THE CORRECT CLEARANCE REQUEST TO BE ADDRESSED 1. What type of surgery is being performed? CATARACT SURGERY ON RIGHT EYE   2. When is this surgery scheduled? TBD   3. What type of clearance is required (medical clearance vs. Pharmacy clearance to hold med vs. Both)? MEDICAL  4. Are there any medications that need to be held prior to surgery and how long? NONE MEDICATIONS LISTED AS NEEDING TO BE HELD   5. Practice name and name of physician performing surgery? [facility]; DR. Remo Lipps DINGELDEIN   6. What is the office phone number? [phone]   7.   What is the office fax number? [place]   Anesthesia type (None, local, MAC, general) ? LOCAL   Ashley [name] 04/06/2020, 8:54 AM  _________________________________________________________________   (provider comments below)

## 2020-04-06 NOTE — Telephone Encounter (Signed)
This encounter was created in error - please disregard.

## 2020-04-06 NOTE — Telephone Encounter (Signed)
   [facility] Pre-operative Risk Assessment    HEARTCARE STAFF: - Please ensure there is not already an duplicate clearance open for this procedure. - Under Visit Info/Reason for Call, type in Other and utilize the format Clearance MM/DD/YY or Clearance TBD. Do not use dashes or single digits. - If request is for dental extraction, please clarify the # of teeth to be extracted.  Request for surgical clearance:  1. What type of surgery is being performed? Buildup and crown on 1 tooth   2. When is this surgery scheduled? TBD  3. What type of clearance is required (medical clearance vs. Pharmacy clearance to hold med vs. Both)? both  4. Are there any medications that need to be held prior to surgery and how long? Not listed, please advise if needed  5. Practice name and name of physician performing surgery? [facility], [place]  6. What is the office phone number? [phone]   7.   What is the office fax number? [phone]  8.   Anesthesia type (None, local, MAC, general) ? ~72 mg of 2% lidocaine with 1:100,000 epinephrine   Ace Gins 04/06/2020, 1:37 PM  _________________________________________________________________   (provider comments below)

## 2020-04-06 NOTE — Telephone Encounter (Signed)
   [facility] Pre-operative Risk Assessment    HEARTCARE STAFF: - Please ensure there is not already an duplicate clearance open for this procedure. - Under Visit Info/Reason for Call, type in Other and utilize the format Clearance MM/DD/YY or Clearance TBD. Do not use dashes or single digits. - If request is for dental extraction, please clarify the # of teeth to be extracted.  Request for surgical clearance:  1. What type of surgery is being performed? CATARACT SURGERY  When is this surgery scheduled?  TBD  2. What type of clearance is required (medical clearance vs. Pharmacy clearance to hold med vs. Both)? MEDICAL  3. Are there any medications that need to be held prior to surgery and how long? NO  4. Practice name and name of physician performing surgery? [facility]  5. What is the office phone number? [phone]   7.   What is the office fax number? [place]  8.   Anesthesia type (None, local, MAC, general) ? anesthesia   Ashley [name] 04/06/2020, 10:19 AM  _________________________________________________________________   (provider comments below)

## 2020-04-07 NOTE — Telephone Encounter (Signed)
Manually faxed clearance to [facility] via faxed machine

## 2020-04-20 DIAGNOSIS — K5909 Other constipation: Secondary | ICD-10-CM | POA: Diagnosis not present

## 2020-04-20 DIAGNOSIS — R159 Full incontinence of feces: Secondary | ICD-10-CM | POA: Diagnosis not present

## 2020-04-20 DIAGNOSIS — M6289 Other specified disorders of muscle: Secondary | ICD-10-CM | POA: Diagnosis not present

## 2020-04-21 DIAGNOSIS — M7061 Trochanteric bursitis, right hip: Secondary | ICD-10-CM | POA: Diagnosis not present

## 2020-04-21 DIAGNOSIS — M25561 Pain in right knee: Secondary | ICD-10-CM | POA: Diagnosis not present

## 2020-04-22 ENCOUNTER — Encounter: Payer: Self-pay | Admitting: Family Medicine

## 2020-04-22 DIAGNOSIS — K5909 Other constipation: Secondary | ICD-10-CM | POA: Insufficient documentation

## 2020-05-10 ENCOUNTER — Ambulatory Visit (INDEPENDENT_AMBULATORY_CARE_PROVIDER_SITE_OTHER): Payer: Medicare Other

## 2020-05-10 DIAGNOSIS — I5022 Chronic systolic (congestive) heart failure: Secondary | ICD-10-CM

## 2020-05-10 DIAGNOSIS — I420 Dilated cardiomyopathy: Secondary | ICD-10-CM | POA: Diagnosis not present

## 2020-05-12 ENCOUNTER — Telehealth: Payer: Self-pay

## 2020-05-12 ENCOUNTER — Telehealth (HOSPITAL_COMMUNITY): Payer: Self-pay | Admitting: Internal Medicine

## 2020-05-12 NOTE — Telephone Encounter (Signed)
Called pt back, pt does not have questions about her EF, pt is trying to get schedule with Bevelyn Buckles. Bensimhon, MD cardiologist in [place], she reports has left several messages at the [phone] number but has not had a return call. Advised pt to try the [facility] [address] location to help assist with trying to get an appt with Dr. Gala Romney, at [phone], that he does not work out of the [facility] location. Pt is under the care of Dr. Mariah Milling, however, refers to [facility] in [place] for her defibrillator. Pt will try to keep reaching out to Dr. Gala Romney for an appt.

## 2020-05-12 NOTE — Telephone Encounter (Signed)
Pt stated Dr Roanna Epley suggested her to see DB, please call pt [phone], some changes in medical condition. Thanks

## 2020-05-12 NOTE — Telephone Encounter (Signed)
Patient called in because she was called yesterday because she had a missed transmission. I have helped her send that transmission. Patient has questions about her ejection fraction and wants to know if you can give her a call as I cannot answer those questions

## 2020-05-12 NOTE — Telephone Encounter (Signed)
There is a referral from Dr.Gollan in epic. Please schedule next available appointment.   Thank you

## 2020-05-13 LAB — CUP PACEART REMOTE DEVICE CHECK
Battery Remaining Longevity: 75 mo
Battery Voltage: 2.98 V
Brady Statistic AP VP Percent: 0.11 %
Brady Statistic AP VS Percent: 0.01 %
Brady Statistic AS VP Percent: 99.51 %
Brady Statistic AS VS Percent: 0.37 %
Brady Statistic RA Percent Paced: 0.12 %
Brady Statistic RV Percent Paced: 98.96 %
Date Time Interrogation Session: 20211229095130
HighPow Impedance: 76 Ohm
Implantable Lead Implant Date: 20130301
Implantable Lead Implant Date: 20200622
Implantable Lead Implant Date: 20200622
Implantable Lead Location: 753859
Implantable Lead Location: 753860
Implantable Lead Location: 753860
Implantable Lead Model: 3830
Implantable Lead Model: 5076
Implantable Lead Model: 6935
Implantable Pulse Generator Implant Date: 20200622
Lead Channel Impedance Value: 247 Ohm
Lead Channel Impedance Value: 323 Ohm
Lead Channel Impedance Value: 342 Ohm
Lead Channel Impedance Value: 380 Ohm
Lead Channel Impedance Value: 399 Ohm
Lead Channel Impedance Value: 494 Ohm
Lead Channel Pacing Threshold Amplitude: 0.375 V
Lead Channel Pacing Threshold Amplitude: 0.5 V
Lead Channel Pacing Threshold Amplitude: 0.625 V
Lead Channel Pacing Threshold Pulse Width: 0.4 ms
Lead Channel Pacing Threshold Pulse Width: 0.4 ms
Lead Channel Pacing Threshold Pulse Width: 0.4 ms
Lead Channel Sensing Intrinsic Amplitude: 1.75 mV
Lead Channel Sensing Intrinsic Amplitude: 1.75 mV
Lead Channel Sensing Intrinsic Amplitude: 7.75 mV
Lead Channel Sensing Intrinsic Amplitude: 7.75 mV
Lead Channel Setting Pacing Amplitude: 1 V
Lead Channel Setting Pacing Amplitude: 1.5 V
Lead Channel Setting Pacing Amplitude: 2.5 V
Lead Channel Setting Pacing Pulse Width: 0.4 ms
Lead Channel Setting Pacing Pulse Width: 0.4 ms
Lead Channel Setting Sensing Sensitivity: 0.3 mV

## 2020-05-19 DIAGNOSIS — M6289 Other specified disorders of muscle: Secondary | ICD-10-CM | POA: Diagnosis not present

## 2020-05-21 NOTE — Progress Notes (Signed)
Remote ICD transmission.   

## 2020-05-22 ENCOUNTER — Other Ambulatory Visit: Payer: Self-pay | Admitting: Cardiovascular Disease

## 2020-05-24 ENCOUNTER — Telehealth: Payer: Self-pay

## 2020-05-24 ENCOUNTER — Encounter: Payer: Self-pay | Admitting: Pulmonary Disease

## 2020-05-24 ENCOUNTER — Ambulatory Visit (INDEPENDENT_AMBULATORY_CARE_PROVIDER_SITE_OTHER): Payer: Medicare Other | Admitting: Pulmonary Disease

## 2020-05-24 ENCOUNTER — Other Ambulatory Visit: Payer: Self-pay

## 2020-05-24 DIAGNOSIS — I428 Other cardiomyopathies: Secondary | ICD-10-CM

## 2020-05-24 DIAGNOSIS — R053 Chronic cough: Secondary | ICD-10-CM | POA: Diagnosis not present

## 2020-05-24 DIAGNOSIS — K219 Gastro-esophageal reflux disease without esophagitis: Secondary | ICD-10-CM | POA: Diagnosis not present

## 2020-05-24 DIAGNOSIS — Z20822 Contact with and (suspected) exposure to covid-19: Secondary | ICD-10-CM

## 2020-05-24 DIAGNOSIS — D803 Selective deficiency of immunoglobulin G [IgG] subclasses: Secondary | ICD-10-CM | POA: Diagnosis not present

## 2020-05-24 MED ORDER — PANTOPRAZOLE SODIUM 40 MG PO TBEC: 40.0000 mg | DELAYED_RELEASE_TABLET | Freq: Every day | ORAL | 2 refills | Status: DC

## 2020-05-24 MED ORDER — LEVALBUTEROL TARTRATE 45 MCG/ACT IN AERO: 2.0000 | INHALATION_SPRAY | Freq: Four times a day (QID) | RESPIRATORY_TRACT | 3 refills | Status: DC | PRN

## 2020-05-24 NOTE — Telephone Encounter (Signed)
Received call from [facility] mall. During check in, patient mentioned that she was exposed to covid on 05/20/2020. He has taken an at home Covid test, which was negative. I have discussed this with Dr. [name], who recommends a phone visit.  Patient is agreeable.  Nothing further needed at this time.

## 2020-05-24 NOTE — Progress Notes (Signed)
Subjective:    Patient ID: Ashley [name], female    DOB: Dec 31, 1945, 75 y.o.   MRN: [id number]  Virtual Visit Via Video or Telephone Note:   This visit type was conducted due to national recommendations for restrictions regarding the COVID-19 pandemic .  This format is felt to be most appropriate for this patient at this time.  All issues noted in this document were discussed and addressed.  No physical exam was performed (except for noted visual exam findings with Video Visits).    I connected with Ashley [name] by telephone at 10:20 AM and verified that I was speaking with the correct person using two identifiers. Location patient: home Location provider: [facility] Persons participating in the virtual visit: patient, physician   I discussed the limitations, risks, security and privacy concerns of performing an evaluation and management service by phone and the availability of in person appointments. The patient expressed understanding and agreed to proceed.   HPI Ashley [name] is an 75 year old lifelong never smoker who follows here for the issue of cough.  She does have history of mild intermittent asthma without complication and chronic recurrent bronchitis.  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  She unfortunately requires Entresto which does have a side effect of cough.  She also has ongoing issues with gastroesophageal reflux but is not on any medications for this.  She has discontinued use of Qvar.  She states that these Spiriva sample was provided for her felt like somebody was "punching her in the gut" when she used it.  It is very difficult to provide her with any relief as she cites intolerances to many medications.  She has not had any recent fevers, chills or sweats.  Cough is mostly nonproductive and she has had no hemoptysis.  No recent chest pain.  She has had fatigue but she  attributes this to her recent decline in ejection fraction due to her cardiomyopathy.  She notes that Xopenex alleviates some of her symptoms as well as Best boy.  PFTs performed in June 2021 no overt obstruction and diffusion capacity was normal.  She had a very close exposure to COVID-19 on 20 May 2020, will need testing for the virus.  This prompted telephone visit today.   Review of Systems A 10 point review of systems was performed and it is as noted above otherwise negative.  Current Meds  Medication Sig  . acetaminophen (TYLENOL) 500 MG tablet Take 500 mg by mouth daily as needed for moderate pain or headache (PAIN).   . AMBULATORY NON FORMULARY MEDICATION Medication Name:  1 squatty potty to use as needed  . Ascorbic Acid (VITAMIN C) 1000 MG tablet Take 1,000 mg by mouth See admin instructions. Take 1000 mg daily, may take an additional 1000 mg as needed for cold symptoms  . aspirin EC 81 MG tablet Take 81 mg by mouth at bedtime.   . benzonatate (TESSALON) 200 MG capsule Take 1 capsule (200 mg total) by mouth 3 (three) times daily as needed for cough.  . cholecalciferol (VITAMIN D) 1000 units tablet Take 1,000 Units by mouth daily.   . Coenzyme Q10 (COQ-10) 100 MG CAPS Take 100 mg by mouth daily.  Marland Kitchen dextromethorphan (DELSYM) 30 MG/5ML liquid Take 15 mg by mouth daily as needed for cough.   . diclofenac Sodium (VOLTAREN) 1 % GEL Apply 1 application topically 4 (four) times daily.  Marland Kitchen ezetimibe (ZETIA)  10 MG tablet Take 1 tablet (10 mg total) by mouth daily.  Marland Kitchen GARLIC PO Take 1 capsule by mouth daily.  Marland Kitchen guaiFENesin (MUCINEX) 600 MG 12 hr tablet Take 600 mg by mouth daily.  Marland Kitchen LORazepam (ATIVAN) 0.5 MG tablet Take 0.5-1 tablets (0.25-0.5 mg total) by mouth at bedtime as needed for anxiety or sleep.  . Menthol, Topical Analgesic, (BIOFREEZE ROLL-ON EX) Apply topically.  . miconazole (MICOTIN) 2 % powder Apply 1 application topically as needed (yeast).  . nystatin  (MYCOSTATIN) 100000 UNIT/ML suspension Take 5 mLs (500,000 Units total) by mouth 3 (three) times daily.  Marland Kitchen nystatin cream (MYCOSTATIN) Apply 1 application topically as needed for dry skin (yeast infection).  . pantoprazole (PROTONIX) 40 MG tablet Take 1 tablet (40 mg total) by mouth daily.  . potassium chloride (KLOR-CON) 10 MEQ tablet Take 1 tablet (10 mEq total) by mouth daily as needed (with laxis).  Marland Kitchen pyridOXINE (VITAMIN B-6) 100 MG tablet Take 100 mg by mouth daily.  . Red Yeast Rice Extract (RED YEAST RICE PO) Take 1 capsule by mouth in the morning and at bedtime.   . sacubitril-valsartan (ENTRESTO) 49-51 MG Take 0.5 tablets by mouth daily.  Marland Kitchen spironolactone (ALDACTONE) 25 MG tablet Take 1 tablet (25 mg total) by mouth daily.  Marland Kitchen torsemide (DEMADEX) 10 MG tablet Take 1 tablet (10 mg total) by mouth every other day.  . vitamin E 400 UNIT capsule Take 400 Units by mouth daily.  . [DISCONTINUED] beclomethasone (QVAR REDIHALER) 80 MCG/ACT inhaler Inhale 2 puffs into the lungs 2 (two) times daily.  . [DISCONTINUED] carvedilol (COREG) 6.25 MG tablet Take 0.5 tablets (3.125 mg total) by mouth 2 (two) times daily with a meal.  . [DISCONTINUED] levalbuterol (XOPENEX HFA) 45 MCG/ACT inhaler Inhale 2 puffs into the lungs every 4 (four) hours as needed for wheezing or shortness of breath.  . [DISCONTINUED] triamcinolone (NASACORT) 55 MCG/ACT AERO nasal inhaler Place 2 sprays into the nose every Monday, Wednesday, and Friday.   Immunization History  Administered Date(s) Administered  . Pneumococcal Conjugate-13 06/08/2015  . Pneumococcal Polysaccharide-23 02/16/2012  . Td 11/12/2005       Objective:   Physical Exam  No physical exam performed as the visit was via telephone.  Patient did not exhibit conversational dyspnea during the visit.      Assessment & Plan:     ICD-10-CM   1. Chronic cough  R05.3    This will be difficult to control due to the patient's numerous intolerances to  medications [name] may be playing a part Will treat GERD more aggressively  2. Gastroesophageal reflux disease, unspecified whether esophagitis present  K21.9    This may be aggravating her cough Trial of Protonix 40 mg daily Antireflux measures  3. Non-ischemic cardiomyopathy ([place])  I42.8    This issue adds complexity to her management Cannot be taken off Entresto  4. 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[place])  D80.3    This issue adds complexity to her management  5. Close exposure to COVID-19 virus  Z20.822    Patient was referred to the testing center   Meds ordered this encounter  Medications  . levalbuterol (XOPENEX HFA) 45 MCG/ACT inhaler    Sig: Inhale 2 puffs into the lungs every 6 (six) hours as needed for wheezing or shortness of breath.    Dispense:  15 g    Refill:  3    Patient intolerant of albuterol but tolerated Levoalbuterol  . pantoprazole (PROTONIX) 40 MG tablet  Sig: Take 1 tablet (40 mg total) by mouth daily.    Dispense:  30 tablet    Refill:  2   Discussion:  Control of her cough will be very difficult due to her numerous intolerances to different medications.  She is also on Entresto but requires this due to significant issues with cardiomyopathy.  Continue symptomatic relief with Tessalon as needed and level albuterol as needed.  Trial of nasal hygiene and management of GERD as noted above.  Her in follow-up in 3 months time she is to contact us prior to that time should any new difficulties arise.   Total visit time 18 minutes of nonface-to-face interaction.  Renold Don, MD [facility]   *This note was dictated using voice recognition software/Dragon.  Despite best efforts to proofread, errors can occur which can change the meaning.  Any change was purely unintentional.

## 2020-05-24 NOTE — Patient Instructions (Signed)
We sent prescriptions to your pharmacy  We will see you in follow-up in 3 months time call sooner should any new problems arise

## 2020-05-27 NOTE — Telephone Encounter (Signed)
Ashley [name] is calling due to never hearing anything back in regards to her clearance for this procedure. Please advise.

## 2020-05-28 NOTE — Telephone Encounter (Signed)
Please inform the patient we have faxed over the cardiac clearance, please also confirm with the requesting office to make sure they have received it

## 2020-05-28 NOTE — Telephone Encounter (Signed)
Left detailed message for pt that the clearance has been sent to [facility] and if she has any questions she can return the call here or contact [facility].   Call also placed to [facility], spoke with [facility] and she has confirmed that surgical clearance has been received.

## 2020-06-03 ENCOUNTER — Telehealth: Payer: Self-pay | Admitting: Pulmonary Disease

## 2020-06-03 DIAGNOSIS — L570 Actinic keratosis: Secondary | ICD-10-CM | POA: Diagnosis not present

## 2020-06-03 DIAGNOSIS — L578 Other skin changes due to chronic exposure to nonionizing radiation: Secondary | ICD-10-CM | POA: Diagnosis not present

## 2020-06-03 DIAGNOSIS — Z859 Personal history of malignant neoplasm, unspecified: Secondary | ICD-10-CM | POA: Diagnosis not present

## 2020-06-03 DIAGNOSIS — Z85828 Personal history of other malignant neoplasm of skin: Secondary | ICD-10-CM | POA: Diagnosis not present

## 2020-06-03 DIAGNOSIS — L821 Other seborrheic keratosis: Secondary | ICD-10-CM | POA: Diagnosis not present

## 2020-06-03 MED ORDER — LEVALBUTEROL TARTRATE 45 MCG/ACT IN AERO: 2.0000 | INHALATION_SPRAY | Freq: Four times a day (QID) | RESPIRATORY_TRACT | 3 refills | Status: DC | PRN

## 2020-06-03 NOTE — Telephone Encounter (Addendum)
Called and spoke to patient.  Patient is requesting an alternative to Protonix.  Patient stated that she has an allergy to Lansoprazole, which is in Protonix.  Rx for xopenex 74mcg has been sent to preferred pharmacy per request.   Dr. [name], please advise on Protonix.

## 2020-06-03 NOTE — Telephone Encounter (Signed)
Pantoprazole (Protonix) is the same as lansoprazole.  She does not seem to have a true allergy but rather that lansoprazole did not agree with her.  This is the reason why I am trying a different medication.  When 1 of these agents does not work we always try another one of the same class.  If she does not want to try the alternative is Pepcid 20 mg at bedtime she can find that over-the-counter.

## 2020-06-03 NOTE — Telephone Encounter (Signed)
Patient is aware of recommendations and voiced her understanding.  She is willing to proceed with Protonix.  She stated that she can not tolerate pepcid, as it causes coughing spells.  Nothing further needed at this time.

## 2020-06-07 ENCOUNTER — Other Ambulatory Visit: Payer: Self-pay

## 2020-06-07 ENCOUNTER — Ambulatory Visit: Payer: Medicare Other

## 2020-06-07 DIAGNOSIS — I5022 Chronic systolic (congestive) heart failure: Secondary | ICD-10-CM

## 2020-06-07 DIAGNOSIS — E782 Mixed hyperlipidemia: Secondary | ICD-10-CM

## 2020-06-07 NOTE — Patient Instructions (Addendum)
Dear Ashley [name],  Below is a summary of the goals we discussed during our follow up appointment on June 07, 2020. Please contact me anytime with questions or concerns.   Visit Information  Goals Addressed            This Visit's Progress   . Pharmacy Care Plan: Cholesterol       CARE PLAN ENTRY  Current Barriers:  . Uncontrolled hyperlipidemia . Current antihyperlipidemic regimen:   Red yeast rice - 1 capsule BID  Ezetimibe 10 mg - 1 tablet daily  . Previous antihyperlipidemic medications tried: atorvastatin, pravastatin, simvastatin, rosuvastatin . Most recent lipid panel:     Component Value Date/Time   CHOL 232 (H) 12/01/2019 0941   TRIG 172.0 (H) 12/01/2019 0941   HDL 47.70 12/01/2019 0941   CHOLHDL 5 12/01/2019 0941   VLDL 34.4 12/01/2019 0941   LDLCALC 149 (H) 12/01/2019 0941   LDLDIRECT 164.0 11/27/2018 1407 .  ASCVD risk enhancing conditions: age >40, CHF . 10-year ASCVD risk score: 14.1% (intermediate risk)  Pharmacist Clinical Goal(s):  Marland Kitchen Over the next 6 months, patient will work with PharmD and providers toward the following goals: o Reduce LDL (bad cholesterol) within goal of less than 100 o Improve HDL (good cholesterol) within goal of greater than 50 o Reduce triglycerides within goal of less than 150   Interventions: . Comprehensive medication review performed; medication list updated in electronic medical record.   Patient Self Care Activities:  . Patient will focus on medication adherence by using pillbox and taking medication daily . Focus on heart healthy diet high in fiber, whole grains, lean meats, fruits, vegetables, and fish   Initial goal documentation and Please see past updates related to this goal by clicking on the "Past Updates" button in the selected goal     . Pharmacy Care Plan: General       CARE PLAN ENTRY  Current Barriers:  . Chronic Disease Management support, education, and care coordination needs related to  congestive heart failure  Pharmacist Clinical Goal(s):  Marland Kitchen Over the next 6 months, patient will work with PharmD and primary care provider to address the following goals: o Heart Failure: Improve Ejection Fraction, reduce medication cost  Interventions: . Comprehensive medication review performed . Coordinating patient assistance application for [employer] mailed to patient for completion/Dr. [name] to sign - call for update on application status . Recommend increasing Entresto to twice daily if tolerated   Patient Self Care Activities:  For the next 6 months until follow up visit:  . Weigh daily first thing in the morning . Continue to work on heart-healthy diet  Follow-up documentation      The patient verbalized understanding of instructions, educational materials, and care plan provided today.  The pharmacy team will reach out to the patient again over the next 30 days.   [name], PharmD Clinical Pharmacist [facility] [phone]

## 2020-06-07 NOTE — Progress Notes (Signed)
I have collaborated with the care management provider regarding care management and care coordination activities outlined in this encounter and have reviewed this encounter including documentation in the note and care plan. I am certifying that I agree with the content of this note and encounter as supervising physician.  

## 2020-06-07 NOTE — Chronic Care Management (AMB) (Signed)
Chronic Care Management Pharmacy  Name: Ashley [name]  MRN: [id number] DOB: 12/27/1945  Chief Complaint/ HPI  Ashley [name],  75 y.o., female presents for their Follow-Up CCM visit with the clinical pharmacist via telephone.  PCP : Ashley Bush, MD  Their chronic conditions include: hyperlipidemia, heart failure, chronic bronchitis, allergic rhinitis, anxiety, MDD   Patient concerns:  Did not start pantoprazole due to concern about side effects - diarrhea, upset stomach, bone fractures. Not worth it for side effects for her. Discussed risks verus benefits. She does not want to take it because she reports the cough is related to her heart failure and not acid reflux.   Office Visits:  03/22/20: Ashley [name] - Try decreasing frequency of nasacort and see if you keep same effect. May continue nasal saline irrigation throughout the day. Continue limiting fluids to <64 oz. Return in 4 months for follow up visit. Labs today   06/25/19: Ashley [name] - applied for home BP cuff through insurance, change nasal steroid to qhs only   Consult Visit:  05/24/20: Pulmonology - chronic cough, trial nasal hygiene and GERD management, pantoprazole 40 mg daily, She notes that Xopenex alleviates some of her symptoms as well as Best boy.  05/19/20: [facility] - Recommend pelvic Floor PT with biofeedback therapy  04/20/20: GI - severe constipation, miralax bowel prep, 1/2 cap miralax daily after prep, call after 1 week, anorectal manometry when available   07/28/19: Cardiology - heart failure, try increasing Entresto to BID, continue coreg and spironolactone  Allergies  Allergen Reactions  . Atorvastatin Other (See Comments)    Severe muscle cramps to generic atorvastatin.  Able to take brand lipitor.  . Chlorhexidine Other (See Comments)    blisters  . Codeine Other (See Comments)    Confusion and dizziness  . Cyclobenzaprine Other (See Comments)    Adverse reaction -  back and throat pain, dizziness, exhaustion  . Cymbalta [Duloxetine Hcl] Other (See Comments)    "body spasms and made me feel weird in my head"   . Diflucan [Fluconazole] Nausea And Vomiting  . Dilaudid [Hydromorphone Hcl] Other (See Comments)    Feels like she is burning internally   . 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   . Influenza Vac Split [Influenza Virus Vaccine] Other (See Comments)    "allergic to concentrated eggs that are in vaccine"  . Latex Nausea Only, Rash and Other (See Comments)    Pain and infection  . Morphine And Related Other (See Comments)    Internally feels like she is on fire   . Pravastatin Other (See Comments)    myalgias  . Prevacid [Lansoprazole] Other (See Comments)    Worsened GI side effects  . Prevnar [Pneumococcal 13-Val Conj Vacc] Other (See Comments)    Egg allergy Bad reaction after shot  . Rosuvastatin Other (See Comments)    Was not effective controlling lipids  . Simvastatin Other (See Comments)    Leg cramps  . Tape Other (See Comments) and Rash    All adhesives  - Blisters, itching and burning  blistering  . Metaxalone   . Antihistamines, Chlorpheniramine-Type Other (See Comments)    Alters vision  . Betadine [Povidone Iodine] Rash    Per pt.   . Onion Diarrhea and Other (See Comments)    GI distress, flared IBS  . Other Hives and Rash  NOTE: pt is able to take cephalosporins without reaction  . Penicillins Other (See Comments)    CHILDHOOD ALLEGY/REACTION: "told 50 years ago I couldn't take it; might be immune to it", able to take amoxicillin Has patient had a PCN reaction causing immediate rash, facial/tongue/throat swelling, SOB or lightheadedness with hypotension: Unknown Has patient had a PCN reaction causing severe rash involving mucus membranes or skin necrosis: UnknowN Has patient had a PCN reaction that required hospitalization:  /Unknown Has patient had a PCN reaction occurring within the last 10 years: Unknown If a  . Red [place]ye Nausea Only and Swelling  . Sertraline Other (See Comments)    Overly sedating  . Sulfasalazine Other (See Comments)    "makes my whole body smell like sulfa & makes me sick just smelling it"   Medications: Outpatient Encounter Medications as of 06/07/2020  Medication Sig  . sacubitril-valsartan (ENTRESTO) 49-51 MG Take 0.5 tablets by mouth daily.  Marland Kitchen acetaminophen (TYLENOL) 500 MG tablet Take 500 mg by mouth daily as needed for moderate pain or headache (PAIN).   . AMBULATORY NON FORMULARY MEDICATION Medication Name:  1 squatty potty to use as needed  . Ascorbic Acid (VITAMIN C) 1000 MG tablet Take 1,000 mg by mouth See admin instructions. Take 1000 mg daily, may take an additional 1000 mg as needed for cold symptoms  . aspirin EC 81 MG tablet Take 81 mg by mouth at bedtime.   . benzonatate (TESSALON) 200 MG capsule Take 1 capsule (200 mg total) by mouth 3 (three) times daily as needed for cough.  . carvedilol (COREG) 6.25 MG tablet TAKE 0.5 TABLETS (3.125 MG TOTAL) BY MOUTH 2 (TWO) TIMES DAILY WITH A MEAL.  . cholecalciferol (VITAMIN D) 1000 units tablet Take 1,000 Units by mouth daily.   . Coenzyme Q10 (COQ-10) 100 MG CAPS Take 100 mg by mouth daily.  Marland Kitchen dextromethorphan (DELSYM) 30 MG/5ML liquid Take 15 mg by mouth daily as needed for cough.   . diclofenac Sodium (VOLTAREN) 1 % GEL Apply 1 application topically 4 (four) times daily.  Marland Kitchen ezetimibe (ZETIA) 10 MG tablet Take 1 tablet (10 mg total) by mouth daily.  Marland Kitchen GARLIC PO Take 1 capsule by mouth daily.  Marland Kitchen guaiFENesin (MUCINEX) 600 MG 12 hr tablet Take 600 mg by mouth daily.  Marland Kitchen levalbuterol (XOPENEX HFA) 45 MCG/ACT inhaler Inhale 2 puffs into the lungs every 6 (six) hours as needed for wheezing or shortness of breath.  Marland Kitchen LORazepam (ATIVAN) 0.5 MG tablet Take 0.5-1 tablets (0.25-0.5 mg total) by mouth at bedtime as needed for anxiety or  sleep.  . Menthol, Topical Analgesic, (BIOFREEZE ROLL-ON EX) Apply topically.  . miconazole (MICOTIN) 2 % powder Apply 1 application topically as needed (yeast).  . nystatin (MYCOSTATIN) 100000 UNIT/ML suspension Take 5 mLs (500,000 Units total) by mouth 3 (three) times daily.  Marland Kitchen nystatin cream (MYCOSTATIN) Apply 1 application topically as needed for dry skin (yeast infection).  . potassium chloride (KLOR-CON) 10 MEQ tablet Take 1 tablet (10 mEq total) by mouth daily as needed (with laxis).  Marland Kitchen pyridOXINE (VITAMIN B-6) 100 MG tablet Take 100 mg by mouth daily.  . Red Yeast Rice Extract (RED YEAST RICE PO) Take 1 capsule by mouth in the morning and at bedtime.   Marland Kitchen spironolactone (ALDACTONE) 25 MG tablet Take 1 tablet (25 mg total) by mouth daily.  Marland Kitchen torsemide (DEMADEX) 10 MG tablet Take 1 tablet (10 mg total) by mouth every other day.  . vitamin E  400 UNIT capsule Take 400 Units by mouth daily.  . [DISCONTINUED] bisoprolol (ZEBETA) 5 MG tablet Take 5 mg by mouth daily.  . [DISCONTINUED] pantoprazole (PROTONIX) 40 MG tablet Take 1 tablet (40 mg total) by mouth daily.   No facility-administered encounter medications on file as of 06/07/2020.   Current Diagnosis/Assessment: Emergency planning/management officer Strain: Low Risk   . Difficulty of Paying Living Expenses: Not hard at all    Goals Addressed            This Visit's Progress   . Pharmacy Care Plan: Cholesterol       CARE PLAN ENTRY  Current Barriers:  . Uncontrolled hyperlipidemia . Current antihyperlipidemic regimen:   Red yeast rice - 1 capsule BID  Ezetimibe 10 mg - 1 tablet daily  . Previous antihyperlipidemic medications tried: atorvastatin, pravastatin, simvastatin, rosuvastatin . Most recent lipid panel:     Component Value Date/Time   CHOL 232 (H) 12/01/2019 0941   TRIG 172.0 (H) 12/01/2019 0941   HDL 47.70 12/01/2019 0941   CHOLHDL 5 12/01/2019 0941   VLDL 34.4 12/01/2019 0941   LDLCALC 149 (H) 12/01/2019 0941   LDLDIRECT  164.0 11/27/2018 1407 .  ASCVD risk enhancing conditions: age >63, CHF . 10-year ASCVD risk score: 14.1% (intermediate risk)  Pharmacist Clinical Goal(s):  Marland Kitchen Over the next 6 months, patient will work with PharmD and providers toward the following goals: o Reduce LDL (bad cholesterol) within goal of less than 100 o Improve HDL (good cholesterol) within goal of greater than 50 o Reduce triglycerides within goal of less than 150   Interventions: . Comprehensive medication review performed; medication list updated in electronic medical record.   Patient Self Care Activities:  . Patient will focus on medication adherence by using pillbox and taking medication daily . Focus on heart healthy diet high in fiber, whole grains, lean meats, fruits, vegetables, and fish   Initial goal documentation and Please see past updates related to this goal by clicking on the "Past Updates" button in the selected goal     . Pharmacy Care Plan: General       CARE PLAN ENTRY  Current Barriers:  . Chronic Disease Management support, education, and care coordination needs related to congestive heart failure  Pharmacist Clinical Goal(s):  Marland Kitchen Over the next 6 months, patient will work with PharmD and primary care provider to address the following goals: o Heart Failure: Improve Ejection Fraction, reduce medication cost  Interventions: . Comprehensive medication review performed . Coordinating patient assistance application for [employer] mailed to patient for completion/Dr. [name] to sign - call for update on application status . Recommend increasing Entresto to twice daily if tolerated   Patient Self Care Activities:  For the next 6 months until follow up visit:  . Weigh daily first thing in the morning . Continue to work on heart-healthy diet  Follow-up documentation      Hyperlipidemia   Lipid Panel     Component Value Date/Time   CHOL 232 (H) 12/01/2019 0941   TRIG 172.0 (H) 12/01/2019 0941    HDL 47.70 12/01/2019 0941   CHOLHDL 5 12/01/2019 0941   VLDL 34.4 12/01/2019 0941   LDLCALC 149 (H) 12/01/2019 0941   LDLDIRECT 164.0 11/27/2018 1407    CBC Latest Ref Rng & Units 12/01/2019 08/31/2019 06/27/2019  WBC 4.0 - 10.5 K/uL 8.5 12.9(H) 8.4  Hemoglobin 12.0 - 15.0 g/dL 12.8 13.7 12.7  Hematocrit 36.0 - 46.0 % 38.3 42.0 38.8  Platelets 150.0 -  400.0 K/uL 218.0 211 203.0   The 10-year ASCVD risk score Mikey Bussing DC Jr., et al., 2013) is: 14.2%   Values used to calculate the score:     Age: 38 years     Sex: Female     Is Non-Hispanic African American: No     Diabetic: No     Tobacco smoker: No     Systolic Blood Pressure: 123XX123 mmHg     Is BP treated: Yes     HDL Cholesterol: 47.7 mg/dL     Total Cholesterol: 232 mg/dL   LDL goal < 100 Patient has failed these meds in past: multiple statins (muscle cramps/ineffective in controlling lipids) Patient is currently uncontrolled on the following medications:   Red yeast rice - 1 capsule BID  Ezetimibe 10 mg - 1 tablet daily   CoQ10 100 mg - 1 tablet daily   Aspirin 81 mg - 1 tablet daily  Update 06/07/20: Zetia refills timely. No updated labs - continue as prescribed.   Plan: Continue current medications   Heart Failure   Type: Systolic  Last ejection fraction: 02/2019: EF 30 to 35% NYHA Class: IV (significant limitation of activity / dyspnea at rest) AHA HF Stage: C (Heart disease and symptoms present)  Patient has failed these meds in past: none reported Patient is currently controlled on the following medications:   Spironolactone 25 mg - 1 tablet daily  Carvedilol 6.25 mg - 1/2 tablet BID  Entresto 49-51 mg - 1 tablet BID (taking 1/2 tablet daily)  Torsemide 10 mg - 1 tablet MWF  Potassium chloride 10 mEq - 1 tablet with torsemide  Update 06/07/20: Patient monitors weight daily. She is stable on torsemide three days/week. Patient reports she sent off application for [facility], never heard anything. She still has  some on hand. She is taking Entresto 49-51 mg 1/2 tablet once daily. She reports she didn't feel great on a whole pill.   Plan: Continue current medications; Check status of Entreso assistance application. Discuss increasing Entresto to BID if tolerated at follow up pending resolution of cost concerns.  Vaccines   Reviewed and discussed patient's vaccination history.    Immunization History  Administered Date(s) Administered  . Pneumococcal Conjugate-13 06/08/2015  . Pneumococcal Polysaccharide-23 02/16/2012  . Td 11/12/2005   Plan: Unable to take vaccines due to history of severe allergies.   CCM Follow Up: 6 months, telephone  [name], PharmD Clinical Pharmacist [facility] [phone]

## 2020-06-08 ENCOUNTER — Ambulatory Visit: Payer: Medicare Other | Admitting: Family Medicine

## 2020-06-08 ENCOUNTER — Encounter: Payer: Self-pay | Admitting: Family Medicine

## 2020-06-08 ENCOUNTER — Ambulatory Visit (INDEPENDENT_AMBULATORY_CARE_PROVIDER_SITE_OTHER): Payer: Medicare Other | Admitting: Family Medicine

## 2020-06-08 ENCOUNTER — Other Ambulatory Visit: Payer: Self-pay

## 2020-06-08 VITALS — BP 112/76 | HR 88 | Temp 97.5°F | Ht 66.5 in | Wt 181.4 lb

## 2020-06-08 DIAGNOSIS — I420 Dilated cardiomyopathy: Secondary | ICD-10-CM | POA: Diagnosis not present

## 2020-06-08 DIAGNOSIS — K5909 Other constipation: Secondary | ICD-10-CM | POA: Diagnosis not present

## 2020-06-08 DIAGNOSIS — I5022 Chronic systolic (congestive) heart failure: Secondary | ICD-10-CM | POA: Diagnosis not present

## 2020-06-08 DIAGNOSIS — R159 Full incontinence of feces: Secondary | ICD-10-CM

## 2020-06-08 DIAGNOSIS — K219 Gastro-esophageal reflux disease without esophagitis: Secondary | ICD-10-CM

## 2020-06-08 DIAGNOSIS — J418 Mixed simple and mucopurulent chronic bronchitis: Secondary | ICD-10-CM | POA: Diagnosis not present

## 2020-06-08 DIAGNOSIS — Z95810 Presence of automatic (implantable) cardiac defibrillator: Secondary | ICD-10-CM

## 2020-06-08 DIAGNOSIS — K582 Mixed irritable bowel syndrome: Secondary | ICD-10-CM | POA: Diagnosis not present

## 2020-06-08 DIAGNOSIS — E782 Mixed hyperlipidemia: Secondary | ICD-10-CM | POA: Diagnosis not present

## 2020-06-08 MED ORDER — BENZONATATE 200 MG PO CAPS: 200.0000 mg | ORAL_CAPSULE | Freq: Three times a day (TID) | ORAL | 6 refills | Status: DC | PRN

## 2020-06-08 NOTE — Assessment & Plan Note (Signed)
Appreciate cards care.  Seeing Gollan and Bensimhon.

## 2020-06-08 NOTE — Assessment & Plan Note (Signed)
Appreciate pulm care. Seeing for asthma, qvar recently stopped. Decided not to try PPI. Continues tessalon perls PRN.

## 2020-06-08 NOTE — Patient Instructions (Addendum)
Return in 6 months for wellness visit.  Continue current medicines.  I'm glad you're scheduled for pelvic floor PT.   Mediterranean Diet A Mediterranean diet refers to food and lifestyle choices that are based on the traditions of countries located on the [employer]. This way of eating has been shown to help prevent certain conditions and improve outcomes for people who have chronic diseases, like kidney disease and heart disease. What are tips for following this plan? Lifestyle  Cook and eat meals together with your family, when possible.  Drink enough fluid to keep your urine clear or pale yellow.  Be physically active every day. This includes: ? Aerobic exercise like running or swimming. ? Leisure activities like gardening, walking, or housework.  Get 7-8 hours of sleep each night.  If recommended by your health care provider, drink red wine in moderation. This means 1 glass a day for nonpregnant women and 2 glasses a day for men. A glass of wine equals 5 oz (150 mL). Reading food labels  Check the serving size of packaged foods. For foods such as rice and pasta, the serving size refers to the amount of cooked product, not dry.  Check the total fat in packaged foods. Avoid foods that have saturated fat or trans fats.  Check the ingredients list for added sugars, such as corn syrup.   Shopping  At the grocery store, buy most of your food from the areas near the walls of the store. This includes: ? Fresh fruits and vegetables (produce). ? Grains, beans, nuts, and seeds. Some of these may be available in unpackaged forms or large amounts (in bulk). ? Fresh seafood. ? Poultry and eggs. ? Low-fat dairy products.  Buy whole ingredients instead of prepackaged foods.  Buy fresh fruits and vegetables in-season from local farmers markets.  Buy frozen fruits and vegetables in resealable bags.  If you do not have access to quality fresh seafood, buy precooked frozen shrimp or  canned fish, such as tuna, salmon, or sardines.  Buy small amounts of raw or cooked vegetables, salads, or olives from the deli or salad bar at your store.  Stock your pantry so you always have certain foods on hand, such as olive oil, canned tuna, canned tomatoes, rice, pasta, and beans. Cooking  Cook foods with extra-virgin olive oil instead of using butter or other vegetable oils.  Have meat as a side dish, and have vegetables or grains as your main dish. This means having meat in small portions or adding small amounts of meat to foods like pasta or stew.  Use beans or vegetables instead of meat in common dishes like chili or lasagna.  Experiment with different cooking methods. Try roasting or broiling vegetables instead of steaming or sauteing them.  Add frozen vegetables to soups, stews, pasta, or rice.  Add nuts or seeds for added healthy fat at each meal. You can add these to yogurt, salads, or vegetable dishes.  Marinate fish or vegetables using olive oil, lemon juice, garlic, and fresh herbs. Meal planning  Plan to eat 1 vegetarian meal one day each week. Try to work up to 2 vegetarian meals, if possible.  Eat seafood 2 or more times a week.  Have healthy snacks readily available, such as: ? Vegetable sticks with hummus. ? [place] yogurt. ? Fruit and nut trail mix.  Eat balanced meals throughout the week. This includes: ? Fruit: 2-3 servings a day ? Vegetables: 4-5 servings a day ? Low-fat dairy: 2 servings  a day ? Fish, poultry, or lean meat: 1 serving a day ? Beans and legumes: 2 or more servings a week ? Nuts and seeds: 1-2 servings a day ? Whole grains: 6-8 servings a day ? Extra-virgin olive oil: 3-4 servings a day  Limit red meat and sweets to only a few servings a month   What are my food choices?  Mediterranean diet ? Recommended  Grains: Whole-grain pasta. Brown rice. Bulgar wheat. Polenta. Couscous. Whole-wheat bread. [place].  Vegetables:  Artichokes. Beets. Broccoli. Cabbage. Carrots. Eggplant. Green beans. Chard. Kale. Spinach. Onions. Leeks. Peas. Squash. Tomatoes. Peppers. Radishes.  Fruits: Apples. Apricots. Avocado. Berries. Bananas. Cherries. Dates. Figs. Grapes. Lemons. Melon. Oranges. Peaches. Plums. Pomegranate.  Meats and other protein foods: Beans. Almonds. Sunflower seeds. Pine nuts. Peanuts. [place]. Salmon. Scallops. Shrimp. [place]. Tilapia. Clams. Oysters. Eggs.  Dairy: Low-fat milk. Cheese. Greek yogurt.  Beverages: Water. Red wine. Herbal tea.  Fats and oils: Extra virgin olive oil. Avocado oil. Grape seed oil.  Sweets and desserts: [place] yogurt with honey. Baked apples. Poached pears. Trail mix.  Seasoning and other foods: Basil. Cilantro. Coriander. Cumin. Mint. Parsley. Sage. Rosemary. Tarragon. Garlic. Oregano. Thyme. Pepper. Balsalmic vinegar. Tahini. Hummus. Tomato sauce. Olives. Mushrooms. ? Limit these  Grains: Prepackaged pasta or rice dishes. Prepackaged cereal with added sugar.  Vegetables: Deep fried potatoes (french fries).  Fruits: Fruit canned in syrup.  Meats and other protein foods: Beef. Pork. Lamb. Poultry with skin. Hot dogs. Berniece Salines.  Dairy: Ice cream. Sour cream. Whole milk.  Beverages: Juice. Sugar-sweetened soft drinks. Beer. Liquor and spirits.  Fats and oils: Butter. Canola oil. Vegetable oil. Beef fat (tallow). Lard.  Sweets and desserts: Cookies. Cakes. Pies. Candy.  Seasoning and other foods: Mayonnaise. Premade sauces and marinades. The items listed may not be a complete list. Talk with your dietitian about what dietary choices are right for you. Summary  The Mediterranean diet includes both food and lifestyle choices.  Eat a variety of fresh fruits and vegetables, beans, nuts, seeds, and whole grains.  Limit the amount of red meat and sweets that you eat.  Talk with your health care provider about whether it is safe for you to drink red wine in moderation. This means 1  glass a day for nonpregnant women and 2 glasses a day for men. A glass of wine equals 5 oz (150 mL). This information is not intended to replace advice given to you by your health care provider. Make sure you discuss any questions you have with your health care provider. Document Revised: 12/30/2015 Document Reviewed: 12/23/2015 Elsevier Patient Education  [place].

## 2020-06-08 NOTE — Progress Notes (Signed)
Patient ID: Ashley [name], female    DOB: 24-Nov-1945, 75 y.o.   MRN: [id number]  This visit was conducted in person.  BP 112/76 (BP Location: Left Arm, Patient Position: Sitting, Cuff Size: Normal)   Pulse 88   Temp (!) 97.5 F (36.4 C) (Temporal)   Ht 5' 6.5" (1.689 m)   Wt 181 lb 6 oz (82.3 kg)   SpO2 97%   BMI 28.84 kg/m    CC: 6 mo f/u visit  Subjective:   HPI: Ashley [name] is a 75 y.o. female presenting on 06/08/2020 for Follow-up (Here for 6 mo f/u.)   HLD - doing well on current regimen which includes zetia 10mg  daily and RYR 1 cap BID. Also on coq 10 and aspirin.   HFrEF - continues spironolactone, carvedilol, entresto and torsemide/potassium. Does eat out but tries to choose vegetables/salads and chicken.   Chronic bronchitis/asthma, was on qvar. Saw pulm earlier this month - thought GERD contributing to chronic cough - rec protonix trial (decided not to take). Continues tessalon and xopenex inhaler prn. She finds tessalon perls help. Qvar recently stopped.   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 This is scheduled 07/16/2020 at [facility].   No known h/o lactose intolerance. Drinks 2 glasses of sweet tea and some diet coke daily, doesn't do well with water intake.      Relevant past medical, surgical, family and social history reviewed and updated as indicated. Interim medical history since our last visit reviewed. Allergies and medications reviewed and updated. Outpatient Medications Prior to Visit  Medication Sig Dispense Refill  . acetaminophen (TYLENOL) 500 MG tablet Take 500 mg by mouth daily as needed for moderate pain or headache (PAIN).     . AMBULATORY NON FORMULARY MEDICATION Medication Name:  1 squatty potty to  use as needed 1 each 0  . Ascorbic Acid (VITAMIN C) 1000 MG tablet Take 1,000 mg by mouth See admin instructions. Take 1000 mg daily, may take an additional 1000 mg as needed for cold symptoms    . aspirin EC 81 MG tablet Take 81 mg by mouth at bedtime.     . carvedilol (COREG) 6.25 MG tablet TAKE 0.5 TABLETS (3.125 MG TOTAL) BY MOUTH 2 (TWO) TIMES DAILY WITH A MEAL. 90 tablet 0  . cholecalciferol (VITAMIN D) 1000 units tablet Take 1,000 Units by mouth daily.     . Coenzyme Q10 (COQ-10) 100 MG CAPS Take 100 mg by mouth daily.    Marland Kitchen dextromethorphan (DELSYM) 30 MG/5ML liquid Take 15 mg by mouth daily as needed for cough.     . diclofenac Sodium (VOLTAREN) 1 % GEL Apply 1 application topically 4 (four) times daily.    Marland Kitchen ezetimibe (ZETIA) 10 MG tablet Take 1 tablet (10 mg total) by mouth daily. 90 tablet 3  . fluorouracil (EFUDEX) 5 % cream Apply topically.    Marland Kitchen GARLIC PO Take 1 capsule by mouth daily.    Marland Kitchen guaiFENesin (MUCINEX) 600 MG 12 hr tablet Take 600 mg by mouth daily.    Marland Kitchen levalbuterol (XOPENEX HFA) 45 MCG/ACT inhaler Inhale 2 puffs into the lungs every 6 (six) hours as needed for wheezing or shortness of breath. 15 g 3  . LORazepam (ATIVAN) 0.5 MG tablet Take 0.5-1 tablets (0.25-0.5 mg  total) by mouth at bedtime as needed for anxiety or sleep. 30 tablet 1  . Menthol, Topical Analgesic, (BIOFREEZE ROLL-ON EX) Apply topically.    . miconazole (MICOTIN) 2 % powder Apply 1 application topically as needed (yeast).    . nystatin (MYCOSTATIN) 100000 UNIT/ML suspension Take 5 mLs (500,000 Units total) by mouth 3 (three) times daily. 120 mL 1  . nystatin cream (MYCOSTATIN) Apply 1 application topically as needed for dry skin (yeast infection). 30 g 3  . potassium chloride (KLOR-CON) 10 MEQ tablet Take 1 tablet (10 mEq total) by mouth daily as needed (with laxis). 90 tablet 3  . pyridOXINE (VITAMIN B-6) 100 MG tablet Take 100 mg by mouth daily.    . Red Yeast Rice Extract (RED YEAST RICE PO) Take 1  capsule by mouth in the morning and at bedtime.     . sacubitril-valsartan (ENTRESTO) 49-51 MG Take 0.5 tablets by mouth daily.    . vitamin E 400 UNIT capsule Take 400 Units by mouth daily.    . benzonatate (TESSALON) 200 MG capsule Take 1 capsule (200 mg total) by mouth 3 (three) times daily as needed for cough. 30 capsule 3  . spironolactone (ALDACTONE) 25 MG tablet Take 1 tablet (25 mg total) by mouth daily. 90 tablet 3  . torsemide (DEMADEX) 10 MG tablet Take 1 tablet (10 mg total) by mouth every other day. 45 tablet 3   No facility-administered medications prior to visit.     Per HPI unless specifically indicated in ROS section below Review of Systems Objective:  BP 112/76 (BP Location: Left Arm, Patient Position: Sitting, Cuff Size: Normal)   Pulse 88   Temp (!) 97.5 F (36.4 C) (Temporal)   Ht 5' 6.5" (1.689 m)   Wt 181 lb 6 oz (82.3 kg)   SpO2 97%   BMI 28.84 kg/m   Wt Readings from Last 3 Encounters:  06/08/20 181 lb 6 oz (82.3 kg)  03/22/20 182 lb (82.6 kg)  01/12/20 184 lb (83.5 kg)      Physical Exam Vitals and nursing note reviewed.  Constitutional:      Appearance: Normal appearance. She is not ill-appearing.  Eyes:     Extraocular Movements: Extraocular movements intact.     Pupils: Pupils are equal, round, and reactive to light.  Cardiovascular:     Rate and Rhythm: Normal rate and regular rhythm.     Pulses: Normal pulses.     Heart sounds: Normal heart sounds. No murmur heard.   Pulmonary:     Effort: Pulmonary effort is normal. No respiratory distress.     Breath sounds: Normal breath sounds. No wheezing, rhonchi or rales.  Musculoskeletal:     Right lower leg: No edema.     Left lower leg: No edema.  Skin:    General: Skin is warm and dry.     Findings: No rash.  Neurological:     Mental Status: She is alert.  Psychiatric:        Mood and Affect: Mood normal.        Behavior: Behavior normal.       Assessment & Plan:  This visit occurred  during the SARS-CoV-2 public health emergency.  Safety protocols were in place, including screening questions prior to the visit, additional usage of staff PPE, and extensive cleaning of exam room while observing appropriate contact time as indicated for disinfecting solutions.   Problem List Items Addressed This Visit    Mixed hyperlipidemia  Stable period on ezetimibe and RYR. Check FLP next visit.       Irritable bowel syndrome   GERD (gastroesophageal reflux disease)    Decided not to try PPI - concerned about possible side effects.       Fecal incontinence    Appreciate [facility].  Planned PFPT.       Chronic systolic CHF (congestive heart failure) ([place])    Appreciate [facility].  Seeing [facility] and [facility].       Chronic constipation - Primary    Planning to undergo PFPT through [facility] in [place].       Chronic bronchitis ([place])    Appreciate pulm care. Seeing for asthma, qvar recently stopped. Decided not to try PPI. Continues tessalon perls PRN.       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 benzonatate (TESSALON) 200 MG capsule    Sig: Take 1 capsule (200 mg total) by mouth 3 (three) times daily as needed for cough.    Dispense:  30 capsule    Refill:  6   No orders of the defined types were placed in this encounter.   Patient instructions: Return in 6 months for wellness visit.  Continue current medicines.  I'm glad you're scheduled for pelvic floor PT.   Follow up plan: Return in about 6 months (around 12/06/2020) for annual exam, prior fasting for blood work.  [name], MD

## 2020-06-08 NOTE — Assessment & Plan Note (Signed)
Stable period on ezetimibe and RYR. Check FLP next visit.

## 2020-06-08 NOTE — Assessment & Plan Note (Signed)
Appreciate [facility].  Planned PFPT.

## 2020-06-08 NOTE — Assessment & Plan Note (Signed)
Decided not to try PPI - concerned about possible side effects.

## 2020-06-08 NOTE — Assessment & Plan Note (Signed)
Planning to undergo PFPT through [facility] in [place].

## 2020-06-22 ENCOUNTER — Telehealth: Payer: Self-pay

## 2020-06-22 NOTE — Chronic Care Management (AMB) (Addendum)
Chronic Care Management Pharmacy Assistant   Name: Ashley [name]  MRN: [id number] DOB: [birth year]  Reason for Encounter: General Adherence Review  Patient Question:  1.  Have you seen any other providers since your last visit? Yes  06/08/20- Dr. Danise Mina- PCP  PCP : [name], MD  Allergies:   Allergies  Allergen Reactions   Atorvastatin Other (See Comments)    Severe muscle cramps to generic atorvastatin.  Able to take brand lipitor.   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    Influenza Vac Split [Influenza Virus Vaccine] Other (See Comments)    "allergic to concentrated eggs that are in vaccine"   Latex Nausea Only, Rash and Other (See Comments)    Pain and infection   Morphine And Related Other (See Comments)    Internally feels like she is on fire    Pravastatin Other (See Comments)    myalgias   Prevacid [Lansoprazole] Other (See Comments)    Worsened GI side effects   Prevnar [Pneumococcal 13-Val Conj Vacc] Other (See Comments)    Egg allergy Bad reaction after shot   Rosuvastatin Other (See Comments)    Was not effective controlling lipids   Simvastatin Other (See Comments)    Leg cramps   Tape Other (See Comments) and Rash    All adhesives  - Blisters, itching and burning  blistering   Metaxalone    Antihistamines, Chlorpheniramine-Type Other (See Comments)    Alters vision   Betadine [Povidone Iodine] Rash    Per  pt.    Onion Diarrhea and Other (See Comments)    GI distress, flared IBS   Other Hives and Rash    NOTE: pt is able to take cephalosporins without reaction   Penicillins Other (See Comments)    CHILDHOOD ALLEGY/REACTION: "told 50 years ago I couldn't take it; might be immune to it", able to take amoxicillin Has patient had a PCN reaction causing immediate rash, facial/tongue/throat swelling, SOB or lightheadedness with hypotension: Unknown Has patient had a PCN reaction causing severe rash involving mucus membranes or skin necrosis: UnknowN Has patient had a PCN reaction that required hospitalization: /Unknown Has patient had a PCN reaction occurring within the last 10 years: Unknown If a   [place] Nausea Only and Swelling   Sertraline Other (See Comments)    Overly sedating   Sulfasalazine Other (See Comments)    "makes my whole body smell like sulfa & makes me sick just smelling it"    Medications: Outpatient Encounter Medications as of 06/22/2020  Medication Sig   acetaminophen (TYLENOL) 500 MG tablet Take 500 mg by mouth daily as needed for moderate pain or headache (PAIN).    AMBULATORY NON FORMULARY MEDICATION Medication Name:  1 squatty potty to use as needed   Ascorbic Acid (VITAMIN C) 1000 MG tablet Take 1,000 mg by mouth See admin instructions. Take 1000  mg daily, may take an additional 1000 mg as needed for cold symptoms   aspirin EC 81 MG tablet Take 81 mg by mouth at bedtime.    benzonatate (TESSALON) 200 MG capsule Take 1 capsule (200 mg total) by mouth 3 (three) times daily as needed for cough.   carvedilol (COREG) 6.25 MG tablet TAKE 0.5 TABLETS (3.125 MG TOTAL) BY MOUTH 2 (TWO) TIMES DAILY WITH A MEAL.   cholecalciferol (VITAMIN D) 1000 units tablet Take 1,000 Units by mouth daily.    Coenzyme Q10 (COQ-10) 100 MG CAPS Take 100 mg by mouth daily.   dextromethorphan (DELSYM) 30 MG/5ML liquid Take 15 mg by mouth daily as needed for cough.    diclofenac Sodium (VOLTAREN) 1  % GEL Apply 1 application topically 4 (four) times daily.   ezetimibe (ZETIA) 10 MG tablet Take 1 tablet (10 mg total) by mouth daily.   fluorouracil (EFUDEX) 5 % cream Apply topically.   GARLIC PO Take 1 capsule by mouth daily.   guaiFENesin (MUCINEX) 600 MG 12 hr tablet Take 600 mg by mouth daily.   levalbuterol (XOPENEX HFA) 45 MCG/ACT inhaler Inhale 2 puffs into the lungs every 6 (six) hours as needed for wheezing or shortness of breath.   LORazepam (ATIVAN) 0.5 MG tablet Take 0.5-1 tablets (0.25-0.5 mg total) by mouth at bedtime as needed for anxiety or sleep.   Menthol, Topical Analgesic, (BIOFREEZE ROLL-ON EX) Apply topically.   miconazole (MICOTIN) 2 % powder Apply 1 application topically as needed (yeast).   nystatin (MYCOSTATIN) 100000 UNIT/ML suspension Take 5 mLs (500,000 Units total) by mouth 3 (three) times daily.   nystatin cream (MYCOSTATIN) Apply 1 application topically as needed for dry skin (yeast infection).   potassium chloride (KLOR-CON) 10 MEQ tablet Take 1 tablet (10 mEq total) by mouth daily as needed (with laxis).   pyridOXINE (VITAMIN B-6) 100 MG tablet Take 100 mg by mouth daily.   Red Yeast Rice Extract (RED YEAST RICE PO) Take 1 capsule by mouth in the morning and at bedtime.    sacubitril-valsartan (ENTRESTO) 49-51 MG Take 0.5 tablets by mouth daily.   spironolactone (ALDACTONE) 25 MG tablet Take 1 tablet (25 mg total) by mouth daily.   torsemide (DEMADEX) 10 MG tablet Take 1 tablet (10 mg total) by mouth every other day.   vitamin E 400 UNIT capsule Take 400 Units by mouth daily.   [DISCONTINUED] bisoprolol (ZEBETA) 5 MG tablet Take 5 mg by mouth daily.   No facility-administered encounter medications on file as of 06/22/2020.    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   Bleeding external hemorrhoids 10/31/2016   LBBB (left bundle branch block) 07/11/2016   Gastritis 06/12/2016   Fecal incontinence    Chronic generalized abdominal pain 11/22/2015   Abdominal aortic atherosclerosis ([place]) 11/13/2015   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   Sore throat 07/12/2015   Hoarseness 06/21/2015   Hypersensitivity to pneumococcal vaccine 06/10/2015   Medicare annual wellness visit, subsequent 06/08/2015   Advanced  care planning/counseling discussion 06/08/2015   Intertrigo 04/06/2015   Chronic daily headache 04/06/2015   Peripheral neuropathy 04/06/2015   S/P colostomy takedown 02/10/2014   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 10/23/2013   H/O multiple allergies 10/10/2013   History of diverticular abscess 09/25/2013   Osteoporosis 01/13/2013   Irregular heart beat 08/06/2012   MDD (major depressive disorder), recurrent episode, moderate ([place]) 07/09/2012   Chronic recurrent sinusitis 06/17/2012   Vitamin D deficiency 03/05/2012   Essential tremor 12/29/2011   GERD (gastroesophageal reflux disease) 11/13/2011   Allergic rhinitis 11/13/2011   CKD (chronic kidney disease) stage 3, GFR 30-59 ml/min (HCC) 11/09/2011   Epistaxis, recurrent 10/11/2011   Cardiomyopathy, dilated, nonischemic ([place]) 03/01/2011   Mitral regurgitation 29/56/2130   Chronic systolic CHF (congestive heart failure) ([place]) 02/24/2011   Chronic bronchitis ([place]) 01/20/2011   Syncope 11/02/2010   Chronic fatigue and malaise 11/02/2010   Anxiety state 08/18/2010   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   COMMON MIGRAINE 11/22/2006   Irritable bowel syndrome  11/22/2006   FIBROCYSTIC BREAST DISEASE 11/22/2006   Osteoarthritis 11/22/2006   Fibromyalgia 11/22/2006    Recent Relevant Labs: Lab Results  Component Value Date/Time   HGBA1C 5.9 12/01/2019 09:41 AM   HGBA1C 5.9 11/27/2018 02:07 PM    Kidney Function Lab Results  Component Value Date/Time   CREATININE 1.07 03/22/2020 01:14 PM   CREATININE 0.88 12/01/2019 09:41 AM   CREATININE 1.2 (H) 02/14/2017 11:15 AM   CREATININE 2.16 (H) 04/01/2012 05:40 PM   CREATININE 1.54 (H) 09/14/2011 09:43 AM   GFR 51.11 (L) 03/22/2020 01:14 PM   GFRNONAA >60 09/10/2019 10:46 AM   GFRAA >60 09/10/2019 10:46 AM   Contacted Ms. Colclough for general adherence call. Since last visit with CPP, no medication changes have been made.  The patient has not had an ED visit since their last CPP follow up. The patients current Chronic and PDC medications are: Entresto 49-51 mg takes 1/2 tablet daily. She states that she was supposed to take 1 tablet daily but was not tolerating it well. Her cardiologist then suggested she take 1/2 tablet daily. Patient notes she has attempted to take the whole tablet the last few days and has done okay with it. [name] to review if patient has a greater than 5 day gap between last fill. The patient states she feels very tired all the time. Patient has appointment with Dr. Linna Hoff - Cardiology on 06/24/20 to discuss options. The patient has not had any problems with their pharmacy. The patient has not had any side effects with their medicines. The patient has no recommendations for improvements in managing care.    Follow-Up:  Pharmacist Review  [name], CPP notified  [name], [place] Assistant [phone]  Reviewed refill history and no adherence concerns. I have reviewed the care management and care coordination activities outlined in this encounter and I am certifying that I agree with the content of this note. No further action  required.  [name], PharmD Clinical Pharmacist [facility]   [name], PharmD Clinical Pharmacist [facility] [phone]

## 2020-06-23 NOTE — Chronic Care Management (AMB) (Signed)
Contacted Ms. Donigan regarding patient assistance program for [employer]. Wanted to get an update on her status of approval. 06/23/20 Left message for patient to return call.

## 2020-06-24 ENCOUNTER — Encounter (HOSPITAL_COMMUNITY): Payer: Self-pay | Admitting: Internal Medicine

## 2020-06-24 ENCOUNTER — Ambulatory Visit (HOSPITAL_COMMUNITY)
Admission: RE | Admit: 2020-06-24 | Discharge: 2020-06-24 | Disposition: A | Discharge status: Home / Self Care | Payer: Medicare Other | Source: Ambulatory Visit | Attending: Internal Medicine | Admitting: Internal Medicine

## 2020-06-24 ENCOUNTER — Other Ambulatory Visit: Payer: Self-pay

## 2020-06-24 VITALS — BP 116/80 | HR 83 | Wt 184.4 lb

## 2020-06-24 DIAGNOSIS — Z8249 Family history of ischemic heart disease and other diseases of the circulatory system: Secondary | ICD-10-CM | POA: Diagnosis not present

## 2020-06-24 DIAGNOSIS — J449 Chronic obstructive pulmonary disease, unspecified: Secondary | ICD-10-CM | POA: Diagnosis not present

## 2020-06-24 DIAGNOSIS — K580 Irritable bowel syndrome with diarrhea: Secondary | ICD-10-CM | POA: Insufficient documentation

## 2020-06-24 DIAGNOSIS — Z8719 Personal history of other diseases of the digestive system: Secondary | ICD-10-CM | POA: Diagnosis not present

## 2020-06-24 DIAGNOSIS — R0602 Shortness of breath: Secondary | ICD-10-CM | POA: Insufficient documentation

## 2020-06-24 DIAGNOSIS — Z95810 Presence of automatic (implantable) cardiac defibrillator: Secondary | ICD-10-CM | POA: Insufficient documentation

## 2020-06-24 DIAGNOSIS — I110 Hypertensive heart disease with heart failure: Secondary | ICD-10-CM | POA: Insufficient documentation

## 2020-06-24 DIAGNOSIS — Z883 Allergy status to other anti-infective agents status: Secondary | ICD-10-CM | POA: Insufficient documentation

## 2020-06-24 DIAGNOSIS — I428 Other cardiomyopathies: Secondary | ICD-10-CM | POA: Insufficient documentation

## 2020-06-24 DIAGNOSIS — Z8379 Family history of other diseases of the digestive system: Secondary | ICD-10-CM | POA: Diagnosis not present

## 2020-06-24 DIAGNOSIS — I340 Nonrheumatic mitral (valve) insufficiency: Secondary | ICD-10-CM | POA: Diagnosis not present

## 2020-06-24 DIAGNOSIS — I447 Left bundle-branch block, unspecified: Secondary | ICD-10-CM | POA: Diagnosis not present

## 2020-06-24 DIAGNOSIS — Z7982 Long term (current) use of aspirin: Secondary | ICD-10-CM | POA: Insufficient documentation

## 2020-06-24 DIAGNOSIS — Z79899 Other long term (current) drug therapy: Secondary | ICD-10-CM | POA: Diagnosis not present

## 2020-06-24 DIAGNOSIS — Z888 Allergy status to other drugs, medicaments and biological substances status: Secondary | ICD-10-CM | POA: Diagnosis not present

## 2020-06-24 DIAGNOSIS — I5022 Chronic systolic (congestive) heart failure: Secondary | ICD-10-CM | POA: Diagnosis not present

## 2020-06-24 DIAGNOSIS — Z885 Allergy status to narcotic agent status: Secondary | ICD-10-CM | POA: Insufficient documentation

## 2020-06-24 NOTE — Progress Notes (Addendum)
ADVANCED HF CLINIC NOTE  Referring Physician: Dr. [name] Primary Care: [name], MD Primary Cardiologist: [name], MD  HPI:  Ms Horiuchi is a 75 year old with a history of morbid obesity, HTN, COPD, GERD , fibromyaligia, s/p partial colon resection for diverticulitis with re-connection, MGUS, LBBB and systolic HF due to NICM s/p CRT-D referred by Dr. [name] for further evaluation of her HF,   Has h/o NICM dating back to 2012 when she had PNA October 2012 ejection fraction 25-35%,  September 2013 ejection fraction 30-35%.  EF in 2019 had recovered to 60-65% but now EF is back down to 15-20%.   Admitted 6/20 with increased dyspnea and chest pain. Prior to admit she had been treated with levaquin + prednisone for bronchitis. CXR was concerning for interstitial edema. Bedside ECHO performed in the ED showed severely reduced LVEF-->15% and severe MR. Placed on IV lasix and set up for cath. In 6/20 had RHC/LHC with normal coronaries, EF 20-25%, and mildly elevated filling pressures. Renal function has been stable. Underwent upgrade to CRT-D  Last echo 10/21: EF 25%  Here for f/u. She has multiple complaints. Feels very fatigued. Able to do ADLs but gets SOB and tired easily. Edema well controlled on torsemide 10 MWF does no take extra due to AKI. Tried CR but said that was a disaster because she was totally exhausted. Struggling with alternating constipation and diarrhea and she thinks "her gut is a big part of the problem."   Entresto recently increase to 49/51 and tolerating well. Does not watch her diet. Eats a cheese biscuit at [employer] every am.    Cardiac Studies  RHC/LHC 10/16/2018  LVEF 20-25. Normal coronaries RA 8 PCWP 21 CO 4.45 CI 2.35   ECHO 10/14/2018 EF 15-20%. RV normal, Severe central mitral valve regurgitation.   1. The left ventricle has a 2D calculated ejection fraction 15-20%%. The cavity size was mildly dilated. Left ventricular diastolic Doppler  parameters are indeterminate. 2. The right ventricle has normal systolic function. The cavity was normal. There is no increase in right ventricular wall thickness. 3. Left atrial size was moderately dilated. 4. The mitral valve is rheumatic. Moderate thickening of the mitral valve leaflet. There is mild to moderate mitral annular calcification present. Mitral valve regurgitation is severe by color flow Doppler. The MR jet is centrally-directed.   Past Medical History:  Diagnosis Date  . Abdominal aortic atherosclerosis ([place]) 11/2015   by CT  . Allergy   . Anemia   . Anxiety   . Automatic implantable cardioverter-defibrillator in situ 2012   a. MDT single lead ICD.  Marland Kitchen Bronchiectasis    a. possibly mild, treat URIs aggressively  . Cancer ([facility])    basal cell, arms & neck  . CHF (congestive heart failure) ([place])   . Chronic sinusitis   . COPD (chronic obstructive pulmonary disease) ([place])   . Depression   . Dyspnea   . Essential hypertension   . Fibrocystic breast disease   . Fibromyalgia   . GERD (gastroesophageal reflux disease)   . H/O multiple allergies 10/10/2013  . Hemorrhoids   . History of diverticulitis of colon   . History of pneumonia 2012  . Insomnia   . Irritable bowel syndrome   . Laryngeal nodule    Dr. Thomasena Edis  . Migraine without aura, without mention of intractable migraine without mention of status migrainosus   . Mitral regurgitation   . Nonischemic cardiomyopathy ([place])    a. Recovered LV  fxn - prev as low as 25%, now 50-55% by echo 04/2014;  b. 2012 s/p MDT [id number] [place] VR single lead ICD.  Marland Kitchen Osteoarthritis    knees, back, hands, low back,   . Osteoporosis 01/2013   T-2.7 spine (01/2016)  . PONV (postoperative nausea and vomiting)   . sigmoid diverticulitis with perforation, abscess and fistula 09/30/2013  . Sleep apnea 2010   borderline, inconclusive- /w [place]     Current Outpatient Medications  Medication Sig Dispense Refill  . acetaminophen  (TYLENOL) 500 MG tablet Take 500 mg by mouth daily as needed for moderate pain or headache (PAIN).     . AMBULATORY NON FORMULARY MEDICATION Medication Name:  1 squatty potty to use as needed 1 each 0  . Ascorbic Acid (VITAMIN C) 1000 MG tablet Take 1,000 mg by mouth See admin instructions. Take 1000 mg daily, may take an additional 1000 mg as needed for cold symptoms    . aspirin EC 81 MG tablet Take 81 mg by mouth at bedtime.     . benzonatate (TESSALON) 200 MG capsule Take 1 capsule (200 mg total) by mouth 3 (three) times daily as needed for cough. 30 capsule 6  . Biotin 10 MG CAPS Take 1 tablet by mouth daily.    . carvedilol (COREG) 6.25 MG tablet TAKE 0.5 TABLETS (3.125 MG TOTAL) BY MOUTH 2 (TWO) TIMES DAILY WITH A MEAL. 90 tablet 0  . cholecalciferol (VITAMIN D) 1000 units tablet Take 1,000 Units by mouth daily.     . Coenzyme Q10 (COQ-10) 100 MG CAPS Take 100 mg by mouth daily.    Marland Kitchen dextromethorphan (DELSYM) 30 MG/5ML liquid Take 15 mg by mouth daily as needed for cough.     . ezetimibe (ZETIA) 10 MG tablet Take 1 tablet (10 mg total) by mouth daily. 90 tablet 3  . fluorouracil (EFUDEX) 5 % cream Apply topically.    Marland Kitchen GARLIC PO Take 1 capsule by mouth daily.    Marland Kitchen guaiFENesin (MUCINEX) 600 MG 12 hr tablet Take 600 mg by mouth 2 (two) times daily.    Marland Kitchen levalbuterol (XOPENEX HFA) 45 MCG/ACT inhaler Inhale 2 puffs into the lungs every 6 (six) hours as needed for wheezing or shortness of breath. 15 g 3  . LORazepam (ATIVAN) 0.5 MG tablet Take 0.5-1 tablets (0.25-0.5 mg total) by mouth at bedtime as needed for anxiety or sleep. 30 tablet 1  . magnesium 30 MG tablet Take 30 mg by mouth daily.    . Menthol, Topical Analgesic, (BIOFREEZE ROLL-ON EX) Apply topically.    . miconazole (MICOTIN) 2 % powder Apply 1 application topically as needed (yeast).    . nystatin (MYCOSTATIN) 100000 UNIT/ML suspension Take 5 mLs (500,000 Units total) by mouth 3 (three) times daily. 120 mL 1  . nystatin cream  (MYCOSTATIN) Apply 1 application topically as needed for dry skin (yeast infection). 30 g 3  . Omega-3 Fatty Acids (FISH OIL) 1000 MG CAPS Take 1,000 capsules by mouth daily.    . potassium chloride (KLOR-CON) 10 MEQ tablet Take 10 mEq by mouth 3 (three) times a week.    . pyridOXINE (VITAMIN B-6) 100 MG tablet Take 100 mg by mouth daily.    . Red Yeast Rice Extract (RED YEAST RICE PO) Take 1 capsule by mouth in the morning and at bedtime.     . sacubitril-valsartan (ENTRESTO) 49-51 MG Take 1 tablet by mouth daily.    Marland Kitchen spironolactone (ALDACTONE) 25 MG tablet Take  1 tablet (25 mg total) by mouth daily. 90 tablet 3  . torsemide (DEMADEX) 10 MG tablet Take 10 mg by mouth 3 (three) times a week.    . vitamin E 400 UNIT capsule Take 400 Units by mouth daily.     No current facility-administered medications for this encounter.    Allergies  Allergen Reactions  . Atorvastatin Other (See Comments)    Severe muscle cramps to generic atorvastatin.  Able to take brand lipitor.  . Chlorhexidine Other (See Comments)    blisters  . Codeine Other (See Comments)    Confusion and dizziness  . Cyclobenzaprine Other (See Comments)    Adverse reaction - back and throat pain, dizziness, exhaustion  . Cymbalta [Duloxetine Hcl] Other (See Comments)    "body spasms and made me feel weird in my head"   . Diflucan [Fluconazole] Nausea And Vomiting  . Dilaudid [Hydromorphone Hcl] Other (See Comments)    Feels like she is burning internally   . 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   . Influenza Vac Split [Influenza Virus Vaccine] Other (See Comments)    "allergic to concentrated eggs that are in vaccine"  . Latex Nausea Only, Rash and Other (See Comments)    Pain and infection  . Morphine And Related Other (See Comments)    Internally feels like she is on fire   . Pravastatin Other (See Comments)    myalgias  . Prevacid  [Lansoprazole] Other (See Comments)    Worsened GI side effects  . Prevnar [Pneumococcal 13-Val Conj Vacc] Other (See Comments)    Egg allergy Bad reaction after shot  . Rosuvastatin Other (See Comments)    Was not effective controlling lipids  . Simvastatin Other (See Comments)    Leg cramps  . Tape Other (See Comments) and Rash    All adhesives  - Blisters, itching and burning  blistering  . Metaxalone   . Antihistamines, Chlorpheniramine-Type Other (See Comments)    Alters vision  . Betadine [Povidone Iodine] Rash    Per pt.   . Onion Diarrhea and Other (See Comments)    GI distress, flared IBS  . Other Hives and Rash    NOTE: pt is able to take cephalosporins without reaction  . Penicillins Other (See Comments)    CHILDHOOD ALLEGY/REACTION: "told 50 years ago I couldn't take it; might be immune to it", able to take amoxicillin Has patient had a PCN reaction causing immediate rash, facial/tongue/throat swelling, SOB or lightheadedness with hypotension: Unknown Has patient had a PCN reaction causing severe rash involving mucus membranes or skin necrosis: UnknowN Has patient had a PCN reaction that required hospitalization: /Unknown Has patient had a PCN reaction occurring within the last 10 years: Unknown If a  . Red [place]ye Nausea Only and Swelling  . Sertraline Other (See Comments)    Overly sedating  . Sulfasalazine Other (See Comments)    "makes my whole body smell like sulfa & makes me sick just smelling it"      Social History   Socioeconomic History  . Marital status: Married    Spouse name: Not on file  . Number of children: 1  . Years of education: Not on file  . Highest education level: Not on file  Occupational History  . Occupation: Oncologist  . Occupation: Aeronautical engineer  Tobacco Use  . Smoking  status: Never Smoker  . Smokeless tobacco: Never Used  . Tobacco comment: Lived with smokers x 23 yrs, smoked herself "for  a week"  Vaping Use  . Vaping Use: Never used  Substance and Sexual Activity  . Alcohol use: No    Alcohol/week: 0.0 standard drinks  . Drug use: No  . Sexual activity: Not Currently  Other Topics Concern  . Not on file  Social History Narrative   Lives with husband Herbie [place]   1 adopted child   Social Determinants of Health   Financial Resource Strain: Low Risk   . Difficulty of Paying Living Expenses: Not hard at all  Food Insecurity: No Food Insecurity  . Worried About Charity fundraiser in the Last Year: Never true  . Ran Out of Food in the Last Year: Never true  Transportation Needs: No Transportation Needs  . Lack of Transportation (Medical): No  . Lack of Transportation (Non-Medical): No  Physical Activity: Insufficiently Active  . Days of Exercise per Week: 3 days  . Minutes of Exercise per Session: 30 min  Stress: Stress Concern Present  . Feeling of Stress : Rather much  Social Connections: Not on file  Intimate Partner Violence: Not At Risk  . Fear of Current or Ex-Partner: No  . Emotionally Abused: No  . Physically Abused: No  . Sexually Abused: No      Family History  Problem Relation Age of Onset  . Lung cancer Father        + smoker  . Colon polyps Father   . Dementia Mother   . Osteoporosis Mother        Lumbar spine  . Stroke Mother        x 5 @ 40 YOA  . Arthritis Mother        hands  . Emphysema Mother   . Alcohol abuse Paternal Grandfather   . Stroke Paternal Grandfather        ?  Marland Kitchen Heart disease Paternal Grandfather   . Hypertension Maternal Grandfather        ?  Marland Kitchen Heart disease Paternal Grandmother   . Colon cancer Neg Hx   . Esophageal cancer Neg Hx   . Gallbladder disease Neg Hx     Vitals:   06/24/20 1215  BP: 116/80  Pulse: 83  SpO2: 95%  Weight: 83.6 kg (184 lb 6.4 oz)    PHYSICAL EXAM: General:  Obese woman. No respiratory difficulty HEENT: normal Neck: supple. no JVD. Carotids 2+ bilat; no bruits. No lymphadenopathy  or thryomegaly appreciated. Cor: PMI nondisplaced. Regular rate & rhythm. No rubs, gallops or murmurs. Lungs: clear Abdomen: obesesoft, nontender, nondistended. No hepatosplenomegaly. No bruits or masses. Good bowel sounds. Extremities: no cyanosis, clubbing, rash, edema Neuro: alert & oriented x 3, cranial nerves grossly intact. moves all 4 extremities w/o difficulty. Affect pleasant.  ASSESSMENT & PLAN:  1. Chronic systolic HF - h/o NICM dating back to 2012 with fluctuating EF. EF improved to 60% in 2019 but then dropped again  - Echo 6./20 EF 20%  - LHC/RHC 6/20 with normal coronaries and mildly elevated filling pressures.  - Underwent CRT-D upgrade 6/20 but EF has not improved - Echo 10/21 EF 25% - NYHA III-IIIb - Volume status ok on torsemide 10 mg MWF - She is on a very good GDMT regimen - Continue Entresto 49/51 bid - Continue carvedilol 3.125 bid - Continue spiro 25 daily - Would add Farxiga at next visit with Dr.  Gollan (Entresto just increased so I didn't do it today) - I discussed with her that her HF has been very well managed and I am not sure why EF has not improved.  - By her history I do not think her HF is her major limitation despite her low EF. I suspect obesity and deconditioning +/- GI symptoms are playing major roles here.  - We discussed possible CPX to sort this out further but we have decided to wait to see if they can sort her GI issues out further - We also discussed potential VAD consideration down the road but we would only consider this if we proved she had a predominant cardiac limitation. She did not appear overly interested in mechanical support - I will see her back in 6 months to see how she is doing.   Total time spent 45 minutes. Over half that time spent discussing above.   Glori Bickers, MD  11:14 PM

## 2020-06-24 NOTE — Patient Instructions (Signed)
Please call our office in August 2022 to schedule your follow up appointment  If you have any questions or concerns before your next appointment please send us a message through mychart or call our office at [phone].    TO LEAVE A MESSAGE FOR THE NURSE SELECT OPTION 2, PLEASE LEAVE A MESSAGE INCLUDING: . YOUR NAME . DATE OF BIRTH . CALL BACK NUMBER . REASON FOR CALL**this is important as we prioritize the call backs  YOU WILL RECEIVE A CALL BACK THE SAME DAY AS LONG AS YOU CALL BEFORE 4:00 PM   

## 2020-06-28 ENCOUNTER — Telehealth: Payer: Self-pay | Admitting: *Deleted
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
## 2020-06-28 NOTE — Telephone Encounter (Signed)
Noted  

## 2020-06-29 NOTE — Chronic Care Management (AMB) (Addendum)
Contacted patient to get update on Entresto patient assistance application. Patient states she mailed form to manufacturer. I contacted [employer] to get update. Per [employer], they received all necessary documentation 06/25/20. It will take 7-10 business days for decision. Patient made aware of this and also that [employer] will call shortly after if approved to set up delivery.   Follow-Up:  Patient Assistance Coordination and Pharmacist Review  [name], CPP notified  [name], [place] Assistant [phone]  I have reviewed the care management and care coordination activities outlined in this encounter and I am certifying that I agree with the content of this note. No further action required.  [name], PharmD Clinical Pharmacist [facility] [phone]
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
## 2020-07-06 ENCOUNTER — Other Ambulatory Visit: Payer: Self-pay

## 2020-07-06 ENCOUNTER — Ambulatory Visit (INDEPENDENT_AMBULATORY_CARE_PROVIDER_SITE_OTHER): Payer: Medicare Other | Admitting: Cardiovascular Disease

## 2020-07-06 ENCOUNTER — Encounter: Payer: Self-pay | Admitting: Cardiovascular Disease

## 2020-07-06 ENCOUNTER — Telehealth: Payer: Self-pay | Admitting: *Deleted

## 2020-07-06 VITALS — BP 118/88 | HR 74 | Ht 66.5 in | Wt 182.0 lb

## 2020-07-06 DIAGNOSIS — Z95810 Presence of automatic (implantable) cardiac defibrillator: Secondary | ICD-10-CM | POA: Diagnosis not present

## 2020-07-06 DIAGNOSIS — I10 Essential (primary) hypertension: Secondary | ICD-10-CM

## 2020-07-06 DIAGNOSIS — I700 Atherosclerosis of aorta: Secondary | ICD-10-CM | POA: Diagnosis not present

## 2020-07-06 DIAGNOSIS — E782 Mixed hyperlipidemia: Secondary | ICD-10-CM

## 2020-07-06 DIAGNOSIS — I420 Dilated cardiomyopathy: Secondary | ICD-10-CM | POA: Diagnosis not present

## 2020-07-06 DIAGNOSIS — Z79899 Other long term (current) drug therapy: Secondary | ICD-10-CM | POA: Diagnosis not present

## 2020-07-06 DIAGNOSIS — I5022 Chronic systolic (congestive) heart failure: Secondary | ICD-10-CM | POA: Diagnosis not present

## 2020-07-06 MED ORDER — DAPAGLIFLOZIN PROPANEDIOL 10 MG PO TABS: 10.0000 mg | ORAL_TABLET | Freq: Every day | ORAL | 6 refills | Status: DC

## 2020-07-06 NOTE — Telephone Encounter (Signed)
Pt requiring PA for Farxiga 10 mg tablet. PA has been submitted via Covermymeds.   The Prior Authorization request has been approved for Farxiga 10MG  OR TABS. The authorization is valid from 06/06/2020 through 07/06/2021.

## 2020-07-06 NOTE — Patient Instructions (Addendum)
Medication Instructions:  Please start farxiga 10 mg  Once a day after breakfast   Lab work: BMP in 3 weeks time if on the [place] into medical mall at the check in desk, they will direct you to lab registration, hours for labs are Monday-Friday 07:00am-5:30pm (no appointment necessary)  Testing/Procedures: No new testing needed   Follow-Up:  . You will need a follow up appointment in 3 months , APP ok  . Providers on your designated Care Team:   . Murray Hodgkins, NP . Christell Faith, PA-C . Marrianne Mood, PA-C    COVID-19 Vaccine Information can be found at: ShippingScam.co.uk For questions related to vaccine distribution or appointments, please email vaccine@[email or web address] .[email or web address] or call [phone].

## 2020-07-09 ENCOUNTER — Telehealth: Payer: Self-pay | Admitting: *Deleted

## 2020-07-09 NOTE — Telephone Encounter (Signed)
Pt has been approved for Patient assistance for Entresto through [facility].  Pt approved until 05/14/2021 at no out of pocket cost.

## 2020-07-16 DIAGNOSIS — M6281 Muscle weakness (generalized): Secondary | ICD-10-CM | POA: Diagnosis not present

## 2020-07-27 ENCOUNTER — Telehealth: Payer: Self-pay

## 2020-07-27 NOTE — Chronic Care Management (AMB) (Addendum)
Chronic Care Management Pharmacy Assistant   Name: JONAI WEYLAND  MRN: [id number] DOB: 06-25-1945   Reason for Encounter: Patient Assistance Approval- [name]   Recent office visits:  06/08/20- Dr. Danise Mina-   Recent consult visits:  07/06/20- Dr. [name]- Cardiology- Started Farxiga 10 mg daily.  06/24/20- Dr. Glori Bickers- Cardiology   [facility] visits:  None in previous 6 months  Medications: Outpatient Encounter Medications as of 07/27/2020  Medication Sig   acetaminophen (TYLENOL) 500 MG tablet Take 500 mg by mouth daily as needed for moderate pain or headache (PAIN).    Albuterol Sulfate, sensor, (PROAIR DIGIHALER) 108 (90 Base) MCG/ACT AEPB Inhale into the lungs.   alendronate (FOSAMAX) 70 MG tablet Take by mouth.   ALPRAZolam (XANAX) 0.5 MG tablet Take by mouth.   AMBULATORY NON FORMULARY MEDICATION Medication Name:  1 squatty potty to use as needed   ARNUITY ELLIPTA 100 MCG/ACT AEPB    Ascorbic Acid (VITAMIN C) 1000 MG tablet Take 1,000 mg by mouth See admin instructions. Take 1000 mg daily, may take an additional 1000 mg as needed for cold symptoms   aspirin EC 81 MG tablet Take 81 mg by mouth at bedtime.    benzonatate (TESSALON) 200 MG capsule Take 1 capsule (200 mg total) by mouth 3 (three) times daily as needed for cough.   Biotin 10 MG CAPS Take 1 tablet by mouth daily.   carvedilol (COREG) 6.25 MG tablet TAKE 0.5 TABLETS (3.125 MG TOTAL) BY MOUTH 2 (TWO) TIMES DAILY WITH A MEAL.   cholecalciferol (VITAMIN D) 1000 units tablet Take 1,000 Units by mouth daily.    Coenzyme Q10 (COQ-10) 100 MG CAPS Take 100 mg by mouth daily.   dapagliflozin propanediol (FARXIGA) 10 MG TABS tablet Take 1 tablet (10 mg total) by mouth daily before breakfast.   dextromethorphan (DELSYM) 30 MG/5ML liquid Take 15 mg by mouth daily as needed for cough.    dextromethorphan-guaiFENesin (MUCINEX DM) 30-600 MG 12hr tablet Take by mouth.   ezetimibe (ZETIA) 10 MG tablet Take 1  tablet (10 mg total) by mouth daily.   fluorouracil (EFUDEX) 5 % cream Apply topically.   GARLIC PO Take 1 capsule by mouth daily.   guaiFENesin (MUCINEX) 600 MG 12 hr tablet Take 600 mg by mouth 2 (two) times daily.   levalbuterol (XOPENEX HFA) 45 MCG/ACT inhaler Inhale 2 puffs into the lungs every 6 (six) hours as needed for wheezing or shortness of breath.   LORazepam (ATIVAN) 0.5 MG tablet Take 0.5-1 tablets (0.25-0.5 mg total) by mouth at bedtime as needed for anxiety or sleep.   magnesium 30 MG tablet Take 30 mg by mouth daily.   Menthol, Topical Analgesic, (BIOFREEZE ROLL-ON EX) Apply topically.   miconazole (MICOTIN) 2 % powder Apply 1 application topically as needed (yeast).   mometasone (ELOCON) 0.1 % cream Apply topically.   nystatin (MYCOSTATIN) 100000 UNIT/ML suspension Take 5 mLs (500,000 Units total) by mouth 3 (three) times daily.   nystatin cream (MYCOSTATIN) Apply 1 application topically as needed for dry skin (yeast infection).   omega-3 acid ethyl esters (LOVAZA) 1 g capsule Take by mouth.   Omega-3 Fatty Acids (FISH OIL) 1000 MG CAPS Take 1,000 capsules by mouth daily.   omeprazole (PRILOSEC) 40 MG capsule Take by mouth.   pantoprazole (PROTONIX) 40 MG tablet Take 40 mg by mouth daily.   potassium chloride (KLOR-CON) 10 MEQ tablet Take 10 mEq by mouth 3 (three) times a week.   potassium  chloride (KLOR-CON) 10 MEQ tablet Take by mouth.   pyridOXINE (VITAMIN B-6) 100 MG tablet Take 100 mg by mouth daily.   Red Yeast Rice Extract (RED YEAST RICE PO) Take 1 capsule by mouth in the morning and at bedtime.    sacubitril-valsartan (ENTRESTO) 49-51 MG Take 1 tablet by mouth daily.   spironolactone (ALDACTONE) 25 MG tablet Take 1 tablet (25 mg total) by mouth daily.   torsemide (DEMADEX) 10 MG tablet Take 10 mg by mouth 3 (three) times a week.   triamcinolone (NASACORT) 55 MCG/ACT AERO nasal inhaler Place into the nose.   umeclidinium-vilanterol (ANORO ELLIPTA) 62.5-25 MCG/INH  AEPB Inhale into the lungs.   vitamin E 400 UNIT capsule Take 400 Units by mouth daily.   [DISCONTINUED] bisoprolol (ZEBETA) 5 MG tablet Take 5 mg by mouth daily.   No facility-administered encounter medications on file as of 07/27/2020.   Patient approved for Entresto via patient assistance from [employer] starting 07/09/20 - 05/14/21. Patient called to ask how she would obtain this medication. Informed patient this will be mailed directly from the manufacturer.   Star Rating Drugs:  Farxiga 10 mg Last fill : 07/06/20 30 day supply Entresto 49-51  Last fill : 03/24/20  30 day supply (taking 1/2 tablet daily due to cost)  Follow-Up:  Patient Assistance Coordination and Pharmacist Review  [name], CPP notified  [name], [place] Assistant [phone]  I have reviewed the care management and care coordination activities outlined in this encounter and I am certifying that I agree with the content of this note. No further action required.  [name], PharmD Clinical Pharmacist [facility] [phone]

## 2020-07-27 NOTE — Chronic Care Management (AMB) (Addendum)
Per patients request [employer] was contacted to inquire about delivery date. Per representative Malvin Johns will be delivered 07/30/20 by [employer]. Patient is aware.   Follow-Up:  Patient Assistance Coordination and Pharmacist Review  [name], CPP notified  [name], [place] Pharmacy Assistant [phone]

## 2020-07-28 ENCOUNTER — Other Ambulatory Visit
Admission: RE | Admit: 2020-07-28 | Discharge: 2020-07-28 | Disposition: A | Discharge status: Home / Self Care | Payer: Medicare Other | Attending: Cardiovascular Disease | Admitting: Cardiovascular Disease

## 2020-07-28 DIAGNOSIS — I700 Atherosclerosis of aorta: Secondary | ICD-10-CM

## 2020-07-28 DIAGNOSIS — I5022 Chronic systolic (congestive) heart failure: Secondary | ICD-10-CM

## 2020-07-28 DIAGNOSIS — Z79899 Other long term (current) drug therapy: Secondary | ICD-10-CM | POA: Diagnosis not present

## 2020-07-28 LAB — BASIC METABOLIC PANEL
Anion gap: 10 (ref 5–15)
BUN: 28 mg/dL — ABNORMAL HIGH (ref 8–23)
CO2: 24 mmol/L (ref 22–32)
Calcium: 10 mg/dL (ref 8.9–10.3)
Chloride: 105 mmol/L (ref 98–111)
Creatinine, Ser: 1.01 mg/dL — ABNORMAL HIGH (ref 0.44–1.00)
GFR, Estimated: 58 mL/min — ABNORMAL LOW (ref >60)
Glucose, Bld: 102 mg/dL — ABNORMAL HIGH (ref 70–99)
Potassium: 4.6 mmol/L (ref 3.5–5.1)
Sodium: 139 mmol/L (ref 135–145)

## 2020-08-02 DIAGNOSIS — L821 Other seborrheic keratosis: Secondary | ICD-10-CM | POA: Diagnosis not present

## 2020-08-04 DIAGNOSIS — M6281 Muscle weakness (generalized): Secondary | ICD-10-CM | POA: Diagnosis not present

## 2020-08-09 ENCOUNTER — Telehealth: Payer: Self-pay

## 2020-08-09 ENCOUNTER — Ambulatory Visit (INDEPENDENT_AMBULATORY_CARE_PROVIDER_SITE_OTHER): Payer: Medicare Other

## 2020-08-09 DIAGNOSIS — I420 Dilated cardiomyopathy: Secondary | ICD-10-CM

## 2020-08-09 NOTE — Telephone Encounter (Signed)
Patient called in and stated that she has an extensive history with bowel problems. Wednesday, she had an appointment with Dr. Rogers Blocker at [facility] for "Gut Therapy". Per patient, two of her abdominal muscles are not working and this makes it difficult to pass stool therefore resulting in constipation. Patient stated that after this therapy on Thursday, she had diarrhea. Patient stated that this has gotten better, but this morning she is having mild constipation. Patient is concerned and wanted to be seen by Dr. Danise Mina. Informed patient that unfortunately Dr. Danise Mina did not have any appointments this week and wanted to see another provider. Patient scheduled with Dr. Damita Dunnings tomorrow at 4:00. Instructed patient that message would be sent to Dr. Danise Mina, but that she also needed to call Dr. Shelby Mattocks office and make him aware. Patient verbalized understanding.

## 2020-08-09 NOTE — Telephone Encounter (Signed)
[facility] - Client TELEPHONE ADVICE RECORD [facility] Patient Name: Ashley [name] Gender: Female DOB: 12/31/1945 Age: 75 Y 22 D Return Phone Number: [phone] (Primary) [place] [address] Client [facility] - Client Client Site [facility] Physician [name] - MD Contact Type Call Who Is Calling Patient / Member / Family / Caregiver Call Type Triage / Clinical Caller Name Raquel Sarna Relationship To Patient Other Return Phone Number [phone] (Primary) Chief Complaint Diarrhea Reason for Call Symptomatic / Request for [place] is calling for a patient who has had serious diarrhea. She is in a lot of pain in her rib cage. Raquel Sarna from the office states she does not have any appointments and they typically don't see patients with bad diarrhea. Translation No Nurse Assessment Nurse: Rodney Cruise, RN, Hilliard Clark Date/Time (Eastern Time): 08/09/2020 8:42:10 AM Confirm and document reason for call. If symptomatic, describe symptoms. 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 Denies fever, abdomen felt warm all weekend. Does the patient have any new or worsening symptoms? ---Yes Will a triage be completed? ---Yes Related visit to physician within the last 2 weeks? ---No Does the PT have any chronic conditions? (i.e. diabetes, asthma, this includes High risk factors for pregnancy, etc.) ---Yes List chronic conditions. ---Constipation, Arthritis Is this a behavioral health or substance abuse call? ---No Guidelines Guideline Title Affirmed Question Affirmed Notes Nurse Date/Time  (Eastern Time) Diarrhea Abdominal pain (Exception: Pain clears with each passage of diarrhea stool) Baxter, RN, Hilliard Clark 08/09/2020 8:48:52 AM Disp. Time [name] Time) Disposition Final User PLEASE NOTE: All timestamps contained within this report are represented as [place] Standard Time. CONFIDENTIALTY NOTICE: This fax transmission is intended only for the addressee. It contains information that is legally privileged, confidential or otherwise protected from use or disclosure. If you are not the intended recipient, you are strictly prohibited from reviewing, disclosing, copying using or disseminating any of this information or taking any action in reliance on or regarding this information. If you have received this fax in error, please notify us immediately by telephone so that we can arrange for its return to [place]. Phone: [phone], Toll-Free: [phone], Fax: [phone] Page: 2 of 2 Call Id: [id number] 08/09/2020 8:54:06 AM See PCP within 24 Hours Yes Baxter, RN, Lillia Dallas Disagree/Comply Comply Caller Understands Yes PreDisposition Did not know what to do Care Advice Given Per Guideline SEE PCP WITHIN 24 HOURS: CALL BACK IF: * You become worse CARE ADVICE given per Diarrhea (Adult) guideline. Referrals REFERRED TO PCP OFFICE

## 2020-08-10 ENCOUNTER — Ambulatory Visit: Payer: Medicare Other | Admitting: Family Medicine

## 2020-08-10 ENCOUNTER — Ambulatory Visit (INDEPENDENT_AMBULATORY_CARE_PROVIDER_SITE_OTHER): Payer: Medicare Other | Admitting: Family Medicine

## 2020-08-10 ENCOUNTER — Other Ambulatory Visit: Payer: Self-pay

## 2020-08-10 ENCOUNTER — Encounter: Payer: Self-pay | Admitting: Family Medicine

## 2020-08-10 VITALS — BP 114/62 | HR 97 | Temp 98.2°F | Ht 66.5 in | Wt 182.2 lb

## 2020-08-10 DIAGNOSIS — R5382 Chronic fatigue, unspecified: Secondary | ICD-10-CM

## 2020-08-10 DIAGNOSIS — M797 Fibromyalgia: Secondary | ICD-10-CM

## 2020-08-10 DIAGNOSIS — G8929 Other chronic pain: Secondary | ICD-10-CM | POA: Diagnosis not present

## 2020-08-10 DIAGNOSIS — R1084 Generalized abdominal pain: Secondary | ICD-10-CM

## 2020-08-10 DIAGNOSIS — K582 Mixed irritable bowel syndrome: Secondary | ICD-10-CM

## 2020-08-10 DIAGNOSIS — R5381 Other malaise: Secondary | ICD-10-CM | POA: Diagnosis not present

## 2020-08-10 DIAGNOSIS — R159 Full incontinence of feces: Secondary | ICD-10-CM | POA: Diagnosis not present

## 2020-08-10 DIAGNOSIS — Z8719 Personal history of other diseases of the digestive system: Secondary | ICD-10-CM

## 2020-08-10 NOTE — Patient Instructions (Addendum)
Ensure good water intake.  Labs today.  Bland diet.

## 2020-08-10 NOTE — Telephone Encounter (Signed)
Please schedule with me at 4:30pm today

## 2020-08-10 NOTE — Telephone Encounter (Signed)
Noted  

## 2020-08-10 NOTE — Telephone Encounter (Signed)
Patient is scheduled EM 

## 2020-08-10 NOTE — Progress Notes (Signed)
Patient ID: Ashley [name], female    DOB: 1946/04/27, 75 y.o.   MRN: [id number]  This visit was conducted in person.  BP 114/62   Pulse 97   Temp 98.2 F (36.8 C) (Temporal)   Ht 5' 6.5" (1.689 m)   Wt 182 lb 4 oz (82.7 kg)   SpO2 95%   BMI 28.98 kg/m    CC: bowel changes Subjective:   HPI: Ashley [name] is a 75 y.o. female presenting on 08/10/2020 for Constipation (C/o off and on diarrhea then constipation and burning under her ribs radiating down to her abd. )   Sees [facility] Dr [name] recently referred to PFPT, latest session 08/04/2020. Note reviewed - received colonic massage. Pt states PT session was very mild/gentle.   Next day had large episode of watery diarrhea associated with sensation of "insides coming out". Next few days very tired with burning pain and soreness from ribcage down to abdomen, "felt like cracked rib". Dd note slowly feeling better with persistent abdominal soreness. Feels like getting over bad bruise. Since then stools have been very soft and sticky, all brown, small amounts about 4x/day. Nausea after BM, soreness not relieved by BM.   No fevers with this, no blood in stool. No dysuria.   No other changes - new medicines, foods, supplements.  She recently started farxiga 5mg  daily.  Also on a full pill of entresto.  Not taking fosamax.   Next PT session is next Wednesday.      Relevant past medical, surgical, family and social history reviewed and updated as indicated. Interim medical history since our last visit reviewed. Allergies and medications reviewed and updated. Outpatient Medications Prior to Visit  Medication Sig Dispense Refill  . acetaminophen (TYLENOL) 500 MG tablet Take 500 mg by mouth daily as needed for moderate pain or headache (PAIN).     . Albuterol Sulfate, sensor, (PROAIR DIGIHALER) 108 (90 Base) MCG/ACT AEPB Inhale into the lungs.    . ALPRAZolam (XANAX) 0.5 MG tablet Take by mouth.    . AMBULATORY NON FORMULARY  MEDICATION Medication Name:  1 squatty potty to use as needed 1 each 0  . ARNUITY ELLIPTA 100 MCG/ACT AEPB     . Ascorbic Acid (VITAMIN C) 1000 MG tablet Take 1,000 mg by mouth See admin instructions. Take 1000 mg daily, may take an additional 1000 mg as needed for cold symptoms    . aspirin EC 81 MG tablet Take 81 mg by mouth at bedtime.     . benzonatate (TESSALON) 200 MG capsule Take 1 capsule (200 mg total) by mouth 3 (three) times daily as needed for cough. 30 capsule 6  . Biotin 10 MG CAPS Take 1 tablet by mouth daily.    . carvedilol (COREG) 6.25 MG tablet TAKE 0.5 TABLETS (3.125 MG TOTAL) BY MOUTH 2 (TWO) TIMES DAILY WITH A MEAL. 90 tablet 0  . cholecalciferol (VITAMIN D) 1000 units tablet Take 1,000 Units by mouth daily.     . Coenzyme Q10 (COQ-10) 100 MG CAPS Take 100 mg by mouth daily.    . dapagliflozin propanediol (FARXIGA) 10 MG TABS tablet Take 1 tablet (10 mg total) by mouth daily before breakfast. 30 tablet 6  . dextromethorphan (DELSYM) 30 MG/5ML liquid Take 15 mg by mouth daily as needed for cough.     . ezetimibe (ZETIA) 10 MG tablet Take 1 tablet (10 mg total) by mouth daily. 90 tablet 3  . fluorouracil (EFUDEX) 5 %  cream Apply topically.    Marland Kitchen GARLIC PO Take 1 capsule by mouth daily.    Marland Kitchen guaiFENesin (MUCINEX) 600 MG 12 hr tablet Take 600 mg by mouth 2 (two) times daily.    Marland Kitchen levalbuterol (XOPENEX HFA) 45 MCG/ACT inhaler Inhale 2 puffs into the lungs every 6 (six) hours as needed for wheezing or shortness of breath. 15 g 3  . LORazepam (ATIVAN) 0.5 MG tablet Take 0.5-1 tablets (0.25-0.5 mg total) by mouth at bedtime as needed for anxiety or sleep. 30 tablet 1  . magnesium 30 MG tablet Take 30 mg by mouth daily.    . Menthol, Topical Analgesic, (BIOFREEZE ROLL-ON EX) Apply topically.    . miconazole (MICOTIN) 2 % powder Apply 1 application topically as needed (yeast).    . nystatin (MYCOSTATIN) 100000 UNIT/ML suspension Take 5 mLs (500,000 Units total) by mouth 3 (three) times  daily. 120 mL 1  . nystatin cream (MYCOSTATIN) Apply 1 application topically as needed for dry skin (yeast infection). 30 g 3  . omega-3 acid ethyl esters (LOVAZA) 1 g capsule Take by mouth.    . Omega-3 Fatty Acids (FISH OIL) 1000 MG CAPS Take 1,000 capsules by mouth daily.    . pantoprazole (PROTONIX) 40 MG tablet Take 1 tablet (40 mg total) by mouth every Monday, Wednesday, and Friday.    . potassium chloride (KLOR-CON) 10 MEQ tablet Take by mouth.    . pyridOXINE (VITAMIN B-6) 100 MG tablet Take 100 mg by mouth daily.    . Red Yeast Rice Extract (RED YEAST RICE PO) Take 1 capsule by mouth in the morning and at bedtime.     . sacubitril-valsartan (ENTRESTO) 49-51 MG Take 1 tablet by mouth daily.    Marland Kitchen torsemide (DEMADEX) 10 MG tablet Take 10 mg by mouth 3 (three) times a week.    . triamcinolone (NASACORT) 55 MCG/ACT AERO nasal inhaler Place into the nose.    . umeclidinium-vilanterol (ANORO ELLIPTA) 62.5-25 MCG/INH AEPB Inhale into the lungs.    . vitamin E 400 UNIT capsule Take 400 Units by mouth daily.    Marland Kitchen spironolactone (ALDACTONE) 25 MG tablet Take 1 tablet (25 mg total) by mouth daily. 90 tablet 3  . alendronate (FOSAMAX) 70 MG tablet Take by mouth.    . dextromethorphan-guaiFENesin (MUCINEX DM) 30-600 MG 12hr tablet Take by mouth.    . mometasone (ELOCON) 0.1 % cream Apply topically.    Marland Kitchen omeprazole (PRILOSEC) 40 MG capsule Take by mouth.    . pantoprazole (PROTONIX) 40 MG tablet Take 40 mg by mouth daily.    . potassium chloride (KLOR-CON) 10 MEQ tablet Take 10 mEq by mouth 3 (three) times a week.     No facility-administered medications prior to visit.     Per HPI unless specifically indicated in ROS section below Review of Systems Objective:  BP 114/62   Pulse 97   Temp 98.2 F (36.8 C) (Temporal)   Ht 5' 6.5" (1.689 m)   Wt 182 lb 4 oz (82.7 kg)   SpO2 95%   BMI 28.98 kg/m   Wt Readings from Last 3 Encounters:  08/10/20 182 lb 4 oz (82.7 kg)  07/06/20 182 lb (82.6  kg)  06/24/20 184 lb 6.4 oz (83.6 kg)      Physical Exam Vitals and nursing note reviewed.  Constitutional:      Appearance: Normal appearance. She is not ill-appearing.  Cardiovascular:     Rate and Rhythm: Normal rate and regular rhythm.  Pulses: Normal pulses.     Heart sounds: Normal heart sounds. No murmur heard.   Pulmonary:     Effort: Pulmonary effort is normal. No respiratory distress.     Breath sounds: Normal breath sounds. No wheezing, rhonchi or rales.  Abdominal:     General: Bowel sounds are normal. There is no distension.     Palpations: Abdomen is soft. There is no mass.     Tenderness: There is generalized abdominal tenderness and tenderness in the epigastric area and left lower quadrant. There is no guarding or rebound. Negative signs include Murphy's sign.     Hernia: A hernia (periumbilical) is present.  Musculoskeletal:     Right lower leg: No edema.     Left lower leg: No edema.  Skin:    General: Skin is warm and dry.  Neurological:     Mental Status: She is alert.  Psychiatric:        Mood and Affect: Mood normal.        Behavior: Behavior normal.       Results for orders placed or performed in visit on 08/10/20  Comprehensive metabolic panel  Result Value Ref Range   Sodium 137 135 - 145 mEq/L   Potassium 4.7 3.5 - 5.1 mEq/L   Chloride 100 96 - 112 mEq/L   CO2 25 19 - 32 mEq/L   Glucose, Bld 93 70 - 99 mg/dL   BUN 29 (H) 6 - 23 mg/dL   Creatinine, Ser 1.18 0.40 - 1.20 mg/dL   Total Bilirubin 0.3 0.2 - 1.2 mg/dL   Alkaline Phosphatase 111 39 - 117 U/L   AST 22 0 - 37 U/L   ALT 22 0 - 35 U/L   Total Protein 8.4 (H) 6.0 - 8.3 g/dL   Albumin 4.6 3.5 - 5.2 g/dL   GFR 45.32 (L) >60.00 mL/min   Calcium 10.8 (H) 8.4 - 10.5 mg/dL  CBC with Differential/Platelet  Result Value Ref Range   WBC 13.8 (H) 4.0 - 10.5 K/uL   RBC 4.62 3.87 - 5.11 Mil/uL   Hemoglobin 13.8 12.0 - 15.0 g/dL   HCT 41.7 36.0 - 46.0 %   MCV 90.4 78.0 - 100.0 fl   MCHC  32.9 30.0 - 36.0 g/dL   RDW 12.5 11.5 - 15.5 %   Platelets 244.0 150.0 - 400.0 K/uL   Neutrophils Relative % 62.6 43.0 - 77.0 %   Lymphocytes Relative 24.1 12.0 - 46.0 %   Monocytes Relative 11.4 3.0 - 12.0 %   Eosinophils Relative 1.3 0.0 - 5.0 %   Basophils Relative 0.6 0.0 - 3.0 %   Neutro Abs 8.7 (H) 1.4 - 7.7 K/uL   Lymphs Abs 3.3 0.7 - 4.0 K/uL   Monocytes Absolute 1.6 (H) 0.1 - 1.0 K/uL   Eosinophils Absolute 0.2 0.0 - 0.7 K/uL   Basophils Absolute 0.1 0.0 - 0.1 K/uL  Lipase  Result Value Ref Range   Lipase 32.0 11.0 - 59.0 U/L   *Note: Due to a large number of results and/or encounters for the requested time period, some results have not been displayed. A complete set of results can be found in Results Review.   Assessment & Plan:  This visit occurred during the SARS-CoV-2 public health emergency.  Safety protocols were in place, including screening questions prior to the visit, additional usage of staff PPE, and extensive cleaning of exam room while observing appropriate contact time as indicated for disinfecting solutions.   Problem List  Items Addressed This Visit    Irritable bowel syndrome   Relevant Medications   pantoprazole (PROTONIX) 40 MG tablet   Fibromyalgia   Chronic fatigue and malaise   History of diverticular abscess   Chronic generalized abdominal pain - Primary    Recent flare of chronic abdominal pain after PT session this past week consisting of large watery bowel movement followed by smaller loose stools and generalized abdominal soreness with nausea and generalized weakness/malaise. Unclear etiology. Check labs today including CBC and CMP. Fortunately she seems to be noting improvement each day. No med changes at this time. Did recommend bland diet until bowels are more formed.       Relevant Orders   Comprehensive metabolic panel (Completed)   CBC with Differential/Platelet (Completed)   Lipase (Completed)   Fecal incontinence    Followed by [facility] undergoing PFPT          No orders of the defined types were placed in this encounter.  Orders Placed This Encounter  Procedures  . Comprehensive metabolic panel  . CBC with Differential/Platelet  . Lipase    Patient Instructions  Ensure good water intake.  Labs today.  Bland diet.    Follow up plan: Return if symptoms worsen or fail to improve.  [name], MD

## 2020-08-11 LAB — CBC WITH DIFFERENTIAL/PLATELET
Basophils Absolute: 0.1 10*3/uL (ref 0.0–0.1)
Basophils Relative: 0.6 % (ref 0.0–3.0)
Eosinophils Absolute: 0.2 10*3/uL (ref 0.0–0.7)
Eosinophils Relative: 1.3 % (ref 0.0–5.0)
HCT: 41.7 % (ref 36.0–46.0)
Hemoglobin: 13.8 g/dL (ref 12.0–15.0)
Lymphocytes Relative: 24.1 % (ref 12.0–46.0)
Lymphs Abs: 3.3 10*3/uL (ref 0.7–4.0)
MCHC: 32.9 g/dL (ref 30.0–36.0)
MCV: 90.4 fl (ref 78.0–100.0)
Monocytes Absolute: 1.6 10*3/uL — ABNORMAL HIGH (ref 0.1–1.0)
Monocytes Relative: 11.4 % (ref 3.0–12.0)
Neutro Abs: 8.7 10*3/uL — ABNORMAL HIGH (ref 1.4–7.7)
Neutrophils Relative %: 62.6 % (ref 43.0–77.0)
Platelets: 244 10*3/uL (ref 150.0–400.0)
RBC: 4.62 Mil/uL (ref 3.87–5.11)
RDW: 12.5 % (ref 11.5–15.5)
WBC: 13.8 10*3/uL — ABNORMAL HIGH (ref 4.0–10.5)

## 2020-08-11 LAB — COMPREHENSIVE METABOLIC PANEL
ALT: 22 U/L (ref 0–35)
AST: 22 U/L (ref 0–37)
Albumin: 4.6 g/dL (ref 3.5–5.2)
Alkaline Phosphatase: 111 U/L (ref 39–117)
BUN: 29 mg/dL — ABNORMAL HIGH (ref 6–23)
CO2: 25 mEq/L (ref 19–32)
Calcium: 10.8 mg/dL — ABNORMAL HIGH (ref 8.4–10.5)
Chloride: 100 mEq/L (ref 96–112)
Creatinine, Ser: 1.18 mg/dL (ref 0.40–1.20)
GFR: 45.32 mL/min — ABNORMAL LOW (ref >60.00)
Glucose, Bld: 93 mg/dL (ref 70–99)
Potassium: 4.7 mEq/L (ref 3.5–5.1)
Sodium: 137 mEq/L (ref 135–145)
Total Bilirubin: 0.3 mg/dL (ref 0.2–1.2)
Total Protein: 8.4 g/dL — ABNORMAL HIGH (ref 6.0–8.3)

## 2020-08-11 LAB — LIPASE: Lipase: 32 U/L (ref 11.0–59.0)

## 2020-08-11 NOTE — Assessment & Plan Note (Addendum)
Followed by [facility] Now undergoing PFPT

## 2020-08-11 NOTE — Assessment & Plan Note (Signed)
Recent flare of chronic abdominal pain after PT session this past week consisting of large watery bowel movement followed by smaller loose stools and generalized abdominal soreness with nausea and generalized weakness/malaise. Unclear etiology. Check labs today including CBC and CMP. Fortunately she seems to be noting improvement each day. No med changes at this time. Did recommend bland diet until bowels are more formed.

## 2020-08-13 ENCOUNTER — Telehealth: Payer: Self-pay | Admitting: Cardiovascular Disease

## 2020-08-13 LAB — CUP PACEART REMOTE DEVICE CHECK
Battery Remaining Longevity: 73 mo
Battery Voltage: 2.97 V
Brady Statistic AP VP Percent: 0.34 %
Brady Statistic AP VS Percent: 0.01 %
Brady Statistic AS VP Percent: 99.24 %
Brady Statistic AS VS Percent: 0.41 %
Brady Statistic RA Percent Paced: 0.35 %
Brady Statistic RV Percent Paced: 97.81 %
Date Time Interrogation Session: 20220331143501
HighPow Impedance: 84 Ohm
Implantable Lead Implant Date: 20130301
Implantable Lead Implant Date: 20200622
Implantable Lead Implant Date: 20200622
Implantable Lead Location: 753859
Implantable Lead Location: 753860
Implantable Lead Location: 753860
Implantable Lead Model: 3830
Implantable Lead Model: 5076
Implantable Lead Model: 6935
Implantable Pulse Generator Implant Date: 20200622
Lead Channel Impedance Value: 209 Ohm
Lead Channel Impedance Value: 304 Ohm
Lead Channel Impedance Value: 380 Ohm
Lead Channel Impedance Value: 399 Ohm
Lead Channel Impedance Value: 456 Ohm
Lead Channel Impedance Value: 456 Ohm
Lead Channel Pacing Threshold Amplitude: 0.375 V
Lead Channel Pacing Threshold Amplitude: 0.5 V
Lead Channel Pacing Threshold Amplitude: 0.625 V
Lead Channel Pacing Threshold Pulse Width: 0.4 ms
Lead Channel Pacing Threshold Pulse Width: 0.4 ms
Lead Channel Pacing Threshold Pulse Width: 0.4 ms
Lead Channel Sensing Intrinsic Amplitude: 2.5 mV
Lead Channel Sensing Intrinsic Amplitude: 2.5 mV
Lead Channel Sensing Intrinsic Amplitude: 8.125 mV
Lead Channel Sensing Intrinsic Amplitude: 8.125 mV
Lead Channel Setting Pacing Amplitude: 1 V
Lead Channel Setting Pacing Amplitude: 1.5 V
Lead Channel Setting Pacing Amplitude: 2.5 V
Lead Channel Setting Pacing Pulse Width: 0.4 ms
Lead Channel Setting Pacing Pulse Width: 0.4 ms
Lead Channel Setting Sensing Sensitivity: 0.3 mV

## 2020-08-13 NOTE — Telephone Encounter (Signed)
Pt c/o medication issue:  1. Name of Medication: farxiga  2. How are you currently taking this medication (dosage and times per day)? 10 MG 1 tablet daily   3. Are you having a reaction (difficulty breathing--STAT)? Exhausted, not feeling well, white blood count is up   4. What is your medication issue? Patient calling, states that she wants to discuss coming off of [place], believes she may be getting an infection from it, PCP did blood work and counts are up.  Please call to discuss.

## 2020-08-13 NOTE — Telephone Encounter (Signed)
Patient sts that she will need to stop Farixga.  She had recent labs at her pcps office. Pt sts that her WBC and Cr were elevated. She reports increased fatigue and just not feeling well. She feels this is related to the medication.   Adv the the patient to [facility] and an update will be sent to Dr. [name] for further recommendation.  Pt sts that she read that the medication should not be stopped abruptly and that she may need to be weaned off. Confirmed with Laurann Montana, NP that the patient can just stop. Pt adv the the purpose for which Wilder Glade was prescribed does not require she be weaned off.  Adv her that the update will be fwd to Dr. [name]. She rqst Dr. Missy Sabins be update as well.

## 2020-08-16 ENCOUNTER — Telehealth: Payer: Self-pay | Admitting: *Deleted

## 2020-08-16 DIAGNOSIS — G8929 Other chronic pain: Secondary | ICD-10-CM

## 2020-08-16 DIAGNOSIS — R1084 Generalized abdominal pain: Secondary | ICD-10-CM

## 2020-08-16 NOTE — Telephone Encounter (Signed)
Patient called stating that her white blood count and kidney test were recently up. Patient stated that she was told to call and let Dr. Danise Mina know if she was still having symptoms. Patient stated that she did drink lots of water over the weekend. Patient stated that are symptoms are still present but are milder. Patient stated that her ribs are still sore and her kidneys hurt. Patient wants to know if Dr. Danise Mina wants to order an x-ray or a CT scan to find out why she is still having symptoms.

## 2020-08-18 NOTE — Telephone Encounter (Signed)
Spoke with pt relaying Dr. Synthia Innocent message.  Pt verbalizes understanding and will come in tomorrow afternoon for CXR.  Also, states she discussed with Dr. [name] about stopping the Wilder Glade also to see if sxs would resolve.

## 2020-08-18 NOTE — Telephone Encounter (Signed)
Pt left v/m requesting cb about her call on 08/16/20; per this phone note pt has already spoken with [facility] CMA.

## 2020-08-18 NOTE — Telephone Encounter (Signed)
She may come in for xray at her convenience - ordered.  I also saw where she stopped farxiga - would also see if time off this would resolve symptoms.

## 2020-08-19 ENCOUNTER — Ambulatory Visit (INDEPENDENT_AMBULATORY_CARE_PROVIDER_SITE_OTHER)
Admission: RE | Admit: 2020-08-19 | Discharge: 2020-08-19 | Disposition: A | Discharge status: Home / Self Care | Payer: Medicare Other | Source: Ambulatory Visit | Attending: Family Medicine | Admitting: Family Medicine

## 2020-08-19 DIAGNOSIS — G8929 Other chronic pain: Secondary | ICD-10-CM

## 2020-08-19 DIAGNOSIS — R1084 Generalized abdominal pain: Secondary | ICD-10-CM

## 2020-08-19 DIAGNOSIS — R109 Unspecified abdominal pain: Secondary | ICD-10-CM | POA: Diagnosis not present

## 2020-08-19 DIAGNOSIS — M6281 Muscle weakness (generalized): Secondary | ICD-10-CM | POA: Diagnosis not present

## 2020-08-20 NOTE — Progress Notes (Signed)
Remote ICD transmission.   

## 2020-08-24 NOTE — Telephone Encounter (Signed)
Pt left v/m responding to v/m about pt getting xray around her ribs; ribs on lt side still hurt but not as bad as before; pt has gotten off of [place]; ribs have caused a small bruise on skin; what else can be done to find out what pt can do about the pain. Pt request cb. Sending note to [facility].

## 2020-08-24 NOTE — Telephone Encounter (Signed)
plz notify abdominal xray returned reassuringly normal.  If having ongoing lower abdominal discomfort, next step would be CT scan.  If discomfort localizes to ribcage, then anticipate more rib strain and don't recommend CT scan but rather further supportive care with rest, heating pad, holding pillow to ribs when coughing or straining.

## 2020-08-24 NOTE — Telephone Encounter (Signed)
Patient notified as instructed by telephone and verbalized understanding. Patient stated that she is not having any ongoing lower abdominal discomfort. Patient stated that she will continue the supportive care and if she does not continue to improve she will let Dr. Danise Mina.

## 2020-08-24 NOTE — Telephone Encounter (Signed)
Patient called and LVM on triage line stating that the ribs on her L side are still hurting, not as bad as they were and she is bruised on that side. Patient has stopped taking the [place] and wants to know what else she can do for this issue? Please advise.

## 2020-08-25 ENCOUNTER — Other Ambulatory Visit: Payer: Self-pay

## 2020-08-25 ENCOUNTER — Ambulatory Visit (INDEPENDENT_AMBULATORY_CARE_PROVIDER_SITE_OTHER): Payer: Medicare Other | Admitting: Pulmonary Disease

## 2020-08-25 ENCOUNTER — Encounter: Payer: Self-pay | Admitting: Pulmonary Disease

## 2020-08-25 VITALS — BP 100/62 | HR 89 | Temp 97.1°F | Ht 64.21 in | Wt 194.8 lb

## 2020-08-25 DIAGNOSIS — R053 Chronic cough: Secondary | ICD-10-CM | POA: Diagnosis not present

## 2020-08-25 DIAGNOSIS — I428 Other cardiomyopathies: Secondary | ICD-10-CM

## 2020-08-25 DIAGNOSIS — J4520 Mild intermittent asthma, uncomplicated: Secondary | ICD-10-CM

## 2020-08-25 DIAGNOSIS — D472 Monoclonal gammopathy: Secondary | ICD-10-CM

## 2020-08-25 MED ORDER — QVAR REDIHALER 80 MCG/ACT IN AERB: 1.0000 | INHALATION_SPRAY | Freq: Two times a day (BID) | RESPIRATORY_TRACT | 2 refills | Status: DC

## 2020-08-25 MED ORDER — LEVALBUTEROL TARTRATE 45 MCG/ACT IN AERO: 2.0000 | INHALATION_SPRAY | Freq: Four times a day (QID) | RESPIRATORY_TRACT | 3 refills | Status: DC | PRN

## 2020-08-25 NOTE — Patient Instructions (Signed)
continue qvar and as needed xopenex  We will see you back in 6 months.

## 2020-08-25 NOTE — Progress Notes (Signed)
Subjective:    Patient ID: Ashley [name], female    DOB: 11-Oct-1945, 75 y.o.   MRN: [id number]  HPI Ashley [name] is a 75 year old lifelong never smoker who follows for the issue of cough.  This is a scheduled visit.  Most recently we interacted with Ashley [name] on 24 May 2020 this was a via telephone visit as at the time she had had a COVID exposure and testing was pending.  She does have a history of mild intermittent asthma without complication and chronic recurrent bronchitis.  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  She does take Entresto which can have a side effect of cough.  At her prior visit she had indicated that she had discontinued Qvar.  She had not tolerated Spiriva which had been provided for her previously.  She complains of side effects with most medications.  Here recently it appears that she has restarted using Qvar.  Currently she states that her cough is fairly well controlled.  She has not had any recent fevers chills or sweats.  She has not had any hemoptysis or sputum production.  No chest pain of late.  She has significant polypharmacy.  When she gets into a coughing "fit" Xopenex alleviates her symptoms as well as use of Tessalon Perles.  Data: 11/04/2019 PFTs: FEV1 was 1.97 L or 88% predicted, FVC C was 2.72 L or 91% predicted, FEV1/FVC was 72%.  There was no bronchodilator response.  ERV was 12% consistent with obesity. There was no overt air trapping or hyperinflation (my review).  Flow volume loop was normal.  Diffusion capacity normal.  Normal PFTs for age.  Review of Systems A 10 point review of systems was performed and it is as noted above otherwise negative.  Patient Active Problem List   Diagnosis Date Noted  . Chronic constipation 04/22/2020  . Cervical pain (neck) 02/04/2019  . Bilateral leg numbness 06/01/2018  . Bursitis of right hip 06/01/2018  . Ear pain, right 02/16/2018  . Mixed  hyperlipidemia 01/17/2018  . Plantar fasciitis, bilateral 12/13/2017  . Acquired renal cyst of right kidney 08/11/2017  . Unsteadiness on feet 07/24/2017  . Multiple rib fractures 06/19/2017  . 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 High total serum IgM 11/22/2016  . 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 Bleeding external hemorrhoids 10/31/2016  . LBBB (left bundle branch block) 07/11/2016  . Gastritis 06/12/2016  . Fecal incontinence   . Chronic generalized abdominal pain 11/22/2015  . Abdominal aortic atherosclerosis ([place]) 11/13/2015  . 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  . Sore throat 07/12/2015  . Hoarseness 06/21/2015  . Hypersensitivity to pneumococcal vaccine 06/10/2015  . Medicare annual wellness visit, subsequent 06/08/2015  . Advanced care planning/counseling discussion 06/08/2015  . Intertrigo 04/06/2015  . Chronic daily headache 04/06/2015  . Peripheral neuropathy 04/06/2015  . S/P colostomy takedown 02/10/2014  . 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 H/O multiple allergies 10/10/2013  . History of diverticular abscess 09/25/2013  . Osteoporosis 01/13/2013  . Irregular heart beat 08/06/2012  . MDD (major depressive disorder), recurrent episode, moderate ([place]) 07/09/2012  . Chronic recurrent sinusitis 06/17/2012  . Vitamin D deficiency 03/05/2012  . Essential tremor 12/29/2011  . GERD (gastroesophageal reflux disease) 11/13/2011  . Allergic rhinitis 11/13/2011  . CKD (chronic kidney disease) stage 3, GFR 30-59 ml/min (HCC) 11/09/2011  . Epistaxis, recurrent 10/11/2011  . Cardiomyopathy, dilated, nonischemic ([place]) 03/01/2011  . Mitral regurgitation 02/24/2011  . Chronic  systolic CHF (congestive heart failure) ([place]) 02/24/2011  . Chronic bronchitis ([place]) 01/20/2011  . Syncope 11/02/2010  . Chronic fatigue and malaise 11/02/2010  . Anxiety state 08/18/2010  . 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  . COMMON MIGRAINE 11/22/2006  .  Irritable bowel syndrome 11/22/2006  . FIBROCYSTIC BREAST DISEASE 11/22/2006  . Osteoarthritis 11/22/2006  . Fibromyalgia 11/22/2006   Allergies  Allergen Reactions  . Atorvastatin Other (See Comments)    Severe muscle cramps to generic atorvastatin.  Able to take brand lipitor.  . Chlorhexidine Other (See Comments)    blisters  . Codeine Other (See Comments)    Confusion and dizziness  . Cyclobenzaprine Other (See Comments)    Adverse reaction - back and throat pain, dizziness, exhaustion  . Cymbalta [Duloxetine Hcl] Other (See Comments)    "body spasms and made me feel weird in my head"   . Diflucan [Fluconazole] Nausea And Vomiting  . Dilaudid [Hydromorphone Hcl] Other (See Comments)    Feels like she is burning internally   . 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   . Influenza Vac Split [Influenza Virus Vaccine] Other (See Comments)    "allergic to concentrated eggs that are in vaccine"  . Latex Nausea Only, Rash and Other (See Comments)    Pain and infection  . Morphine And Related Other (See Comments)    Internally feels like she is on fire   . Pravastatin Other (See Comments)    myalgias  . Prevacid [Lansoprazole] Other (See Comments)    Worsened GI side effects  . Prevnar [Pneumococcal 13-Val Conj Vacc] Other (See Comments)    Egg allergy Bad reaction after shot  . Rosuvastatin Other (See Comments)    Was not effective controlling lipids  . Simvastatin Other (See Comments)    Leg cramps  . Tape Other (See Comments) and Rash    All adhesives  - Blisters, itching and burning  blistering  . Metaxalone   . Antihistamines, Chlorpheniramine-Type Other (See Comments)    Alters vision  . Betadine [Povidone Iodine] Rash    Per pt.   . Onion Diarrhea and Other (See Comments)    GI distress, flared IBS  . Other Hives and Rash    NOTE: pt is able to take cephalosporins without reaction   . Penicillins Other (See Comments)    CHILDHOOD ALLEGY/REACTION: "told 50 years ago I couldn't take it; might be immune to it", able to take amoxicillin Has patient had a PCN reaction causing immediate rash, facial/tongue/throat swelling, SOB or lightheadedness with hypotension: Unknown Has patient had a PCN reaction causing severe rash involving mucus membranes or skin necrosis: UnknowN Has patient had a PCN reaction that required hospitalization: /Unknown Has patient had a PCN reaction occurring within the last 10 years: Unknown If a  . [place] Nausea Only and Swelling  . Sertraline Other (See Comments)    Overly sedating  . Sulfasalazine Other (See Comments)    "makes my whole body smell like sulfa & makes me sick just smelling it"   Current Meds  Medication Sig  . acetaminophen (TYLENOL) 500 MG tablet Take 500 mg by mouth daily as needed for moderate pain or headache (PAIN).   . Albuterol Sulfate, sensor, (PROAIR DIGIHALER) 108 (90 Base) MCG/ACT AEPB Inhale into the lungs.  . ALPRAZolam (XANAX) 0.5 MG tablet Take by mouth.  . AMBULATORY NON FORMULARY  MEDICATION Medication Name:  1 squatty potty to use as needed  . ARNUITY ELLIPTA 100 MCG/ACT AEPB   . Ascorbic Acid (VITAMIN C) 1000 MG tablet Take 1,000 mg by mouth See admin instructions. Take 1000 mg daily, may take an additional 1000 mg as needed for cold symptoms  . aspirin EC 81 MG tablet Take 81 mg by mouth at bedtime.   . benzonatate (TESSALON) 200 MG capsule Take 1 capsule (200 mg total) by mouth 3 (three) times daily as needed for cough.  . Biotin 10 MG CAPS Take 1 tablet by mouth daily.  . carvedilol (COREG) 6.25 MG tablet TAKE 0.5 TABLETS (3.125 MG TOTAL) BY MOUTH 2 (TWO) TIMES DAILY WITH A MEAL.  . cholecalciferol (VITAMIN D) 1000 units tablet Take 1,000 Units by mouth daily.   . Coenzyme Q10 (COQ-10) 100 MG CAPS Take 100 mg by mouth daily.  . dapagliflozin propanediol (FARXIGA) 10 MG TABS tablet Take 1 tablet (10 mg total)  by mouth daily before breakfast.  . dextromethorphan (DELSYM) 30 MG/5ML liquid Take 15 mg by mouth daily as needed for cough.   . ezetimibe (ZETIA) 10 MG tablet Take 1 tablet (10 mg total) by mouth daily.  . fluorouracil (EFUDEX) 5 % cream Apply topically.  Marland Kitchen GARLIC PO Take 1 capsule by mouth daily.  Marland Kitchen guaiFENesin (MUCINEX) 600 MG 12 hr tablet Take 600 mg by mouth 2 (two) times daily.  Marland Kitchen levalbuterol (XOPENEX HFA) 45 MCG/ACT inhaler Inhale 2 puffs into the lungs every 6 (six) hours as needed for wheezing or shortness of breath.  Marland Kitchen LORazepam (ATIVAN) 0.5 MG tablet Take 0.5-1 tablets (0.25-0.5 mg total) by mouth at bedtime as needed for anxiety or sleep.  . magnesium 30 MG tablet Take 30 mg by mouth daily.  . Menthol, Topical Analgesic, (BIOFREEZE ROLL-ON EX) Apply topically.  . miconazole (MICOTIN) 2 % powder Apply 1 application topically as needed (yeast).  . nystatin (MYCOSTATIN) 100000 UNIT/ML suspension Take 5 mLs (500,000 Units total) by mouth 3 (three) times daily.  Marland Kitchen nystatin cream (MYCOSTATIN) Apply 1 application topically as needed for dry skin (yeast infection).  Marland Kitchen omega-3 acid ethyl esters (LOVAZA) 1 g capsule Take by mouth.  . Omega-3 Fatty Acids (FISH OIL) 1000 MG CAPS Take 1,000 capsules by mouth daily.  . pantoprazole (PROTONIX) 40 MG tablet Take 1 tablet (40 mg total) by mouth every Monday, Wednesday, and Friday.  . potassium chloride (KLOR-CON) 10 MEQ tablet Take by mouth.  . pyridOXINE (VITAMIN B-6) 100 MG tablet Take 100 mg by mouth daily.  . Red Yeast Rice Extract (RED YEAST RICE PO) Take 1 capsule by mouth in the morning and at bedtime.   . sacubitril-valsartan (ENTRESTO) 49-51 MG Take 1 tablet by mouth daily.  Marland Kitchen torsemide (DEMADEX) 10 MG tablet Take 10 mg by mouth 3 (three) times a week.  . triamcinolone (NASACORT) 55 MCG/ACT AERO nasal inhaler Place into the nose.  . umeclidinium-vilanterol (ANORO ELLIPTA) 62.5-25 MCG/INH AEPB Inhale into the lungs.  . vitamin E 400 UNIT  capsule Take 400 Units by mouth daily.   Immunization History  Administered Date(s) Administered  . Pneumococcal Conjugate-13 06/08/2015  . Pneumococcal Polysaccharide-23 02/16/2012  . Td 11/12/2005       Objective:   Physical Exam BP 100/62 (BP Location: Left Arm, Patient Position: Sitting, Cuff Size: Normal)   Pulse 89   Temp (!) 97.1 F (36.2 C) (Temporal)   Ht 5' 4.21" (1.631 m)   Wt 194 lb 12.8 oz (  88.4 kg)   SpO2 96%   BMI 33.22 kg/m  GENERAL: Obese woman, no acute distress.  Fully ambulatory.  No conversational dyspnea. HEAD: Normocephalic, atraumatic.  EYES: Pupils equal, round, reactive to light.  No scleral icterus.  MOUTH: Masking requirements. NECK: Supple. No thyromegaly. Trachea midline. No JVD.  No adenopathy. PULMONARY: Good air entry bilaterally.  No adventitious sounds. CARDIOVASCULAR: S1 and S2. Regular rate and rhythm.  No rubs, murmurs or gallops heard. ABDOMEN: Protuberant, otherwise benign. MUSCULOSKELETAL: No joint deformity, no clubbing, no edema.  NEUROLOGIC: No focal deficit, no gait disturbance, speech is fluent. SKIN: Intact,warm,dry.  No rashes noted. PSYCH: Mood and behavior normal.     Assessment & Plan:     ICD-10-CM   1. Chronic cough  R05.3    Appears to be fairly well controlled on her current regimen She has resumed Qvar 1 puff twice a day Continue as needed Xopenex and Tessalon Perles  2. Non-ischemic cardiomyopathy ([place])  I42.8    This issue adds complexity to her management Will lead to dyspnea and fatigue Entresto therapy may aggravate cough  3. IgM lambda monoclonal gammopathy - MGUS  D47.2    This issue adds complexity to her management  4. Mild intermittent asthma without complication  B34.19    This is highly suspect Issues may be due to nonallergic eosinophilic bronchitis Continue Qvar     Meds ordered this encounter  Medications  . levalbuterol (XOPENEX HFA) 45 MCG/ACT inhaler    Sig: Inhale 2 puffs into the  lungs every 6 (six) hours as needed for wheezing or shortness of breath.    Dispense:  15 g    Refill:  3    Patient intolerant of albuterol but tolerated Levoalbuterol  . beclomethasone (QVAR REDIHALER) 80 MCG/ACT inhaler    Sig: Inhale 1 puff into the lungs 2 (two) times daily.    Dispense:  1 each    Refill:  2   Patient is to continue Qvar and as needed Xopenex as above.  We will see her in follow-up in 6 months time she is to contact us prior to that time should any new difficulties arise.   Renold Don, MD [facility]   *This note was dictated using voice recognition software/Dragon.  Despite best efforts to proofread, errors can occur which can change the meaning.  Any change was purely unintentional.

## 2020-08-28 ENCOUNTER — Other Ambulatory Visit: Payer: Self-pay | Admitting: Cardiovascular Disease

## 2020-08-30 ENCOUNTER — Encounter: Payer: Self-pay | Admitting: Pulmonary Disease

## 2020-09-01 DIAGNOSIS — M6281 Muscle weakness (generalized): Secondary | ICD-10-CM | POA: Diagnosis not present

## 2020-09-07 ENCOUNTER — Telehealth: Payer: Self-pay

## 2020-09-07 NOTE — Chronic Care Management (AMB) (Addendum)
Chronic Care Management Pharmacy Assistant   Name: Ashley [name]  MRN: [id number] DOB: 16-Oct-1945   Reason for Encounter: Disease State- General Adherence   Recent office visits:  08/10/20- Dr. Danise Mina- PCP Ordered Lipase, CMP and CBC with diff. Decreased pantoprazole from daily to only taking 40 mg on Mon, Wed and Fri. Completed course of Fosamax. Discontinued mometasone cream. Return PRN  Recent consult visits:  08/25/20- Dr. Vernard Gambles- Pulmonology - Started Qvar inhaler twice daily. Discontinued Proair, Arnuity Ellipta and Anoro Ellipta. Follow up in 6 months.  08/13/20- Telephone encounter- Cardiology- Patient to hold farxiga.  Hospital visits:  None in previous 6 months  Medications: Outpatient Encounter Medications as of 09/07/2020  Medication Sig   acetaminophen (TYLENOL) 500 MG tablet Take 500 mg by mouth daily as needed for moderate pain or headache (PAIN).    ALPRAZolam (XANAX) 0.5 MG tablet Take by mouth.   AMBULATORY NON FORMULARY MEDICATION Medication Name:  1 squatty potty to use as needed   Ascorbic Acid (VITAMIN C) 1000 MG tablet Take 1,000 mg by mouth See admin instructions. Take 1000 mg daily, may take an additional 1000 mg as needed for cold symptoms   aspirin EC 81 MG tablet Take 81 mg by mouth at bedtime.    beclomethasone (QVAR REDIHALER) 80 MCG/ACT inhaler Inhale 1 puff into the lungs 2 (two) times daily.   benzonatate (TESSALON) 200 MG capsule Take 1 capsule (200 mg total) by mouth 3 (three) times daily as needed for cough.   Biotin 10 MG CAPS Take 1 tablet by mouth daily.   carvedilol (COREG) 6.25 MG tablet TAKE 0.5 TABLETS (3.125 MG TOTAL) BY MOUTH 2 (TWO) TIMES DAILY WITH A MEAL.   cholecalciferol (VITAMIN D) 1000 units tablet Take 1,000 Units by mouth daily.    Coenzyme Q10 (COQ-10) 100 MG CAPS Take 100 mg by mouth daily.   dapagliflozin propanediol (FARXIGA) 10 MG TABS tablet Take 1 tablet (10 mg total) by mouth daily before breakfast.    dextromethorphan (DELSYM) 30 MG/5ML liquid Take 15 mg by mouth daily as needed for cough.    ezetimibe (ZETIA) 10 MG tablet Take 1 tablet (10 mg total) by mouth daily.   fluorouracil (EFUDEX) 5 % cream Apply topically.   GARLIC PO Take 1 capsule by mouth daily.   guaiFENesin (MUCINEX) 600 MG 12 hr tablet Take 600 mg by mouth 2 (two) times daily.   levalbuterol (XOPENEX HFA) 45 MCG/ACT inhaler Inhale 2 puffs into the lungs every 6 (six) hours as needed for wheezing or shortness of breath.   LORazepam (ATIVAN) 0.5 MG tablet Take 0.5-1 tablets (0.25-0.5 mg total) by mouth at bedtime as needed for anxiety or sleep.   magnesium 30 MG tablet Take 30 mg by mouth daily.   Menthol, Topical Analgesic, (BIOFREEZE ROLL-ON EX) Apply topically.   miconazole (MICOTIN) 2 % powder Apply 1 application topically as needed (yeast).   nystatin (MYCOSTATIN) 100000 UNIT/ML suspension Take 5 mLs (500,000 Units total) by mouth 3 (three) times daily.   nystatin cream (MYCOSTATIN) Apply 1 application topically as needed for dry skin (yeast infection).   omega-3 acid ethyl esters (LOVAZA) 1 g capsule Take by mouth.   Omega-3 Fatty Acids (FISH OIL) 1000 MG CAPS Take 1,000 capsules by mouth daily.   pantoprazole (PROTONIX) 40 MG tablet Take 1 tablet (40 mg total) by mouth every Monday, Wednesday, and Friday.   potassium chloride (KLOR-CON) 10 MEQ tablet Take by mouth.   pyridOXINE (VITAMIN B-6)  100 MG tablet Take 100 mg by mouth daily.   Red Yeast Rice Extract (RED YEAST RICE PO) Take 1 capsule by mouth in the morning and at bedtime.    sacubitril-valsartan (ENTRESTO) 49-51 MG Take 1 tablet by mouth daily.   spironolactone (ALDACTONE) 25 MG tablet Take 1 tablet (25 mg total) by mouth daily.   torsemide (DEMADEX) 10 MG tablet Take 10 mg by mouth 3 (three) times a week.   triamcinolone (NASACORT) 55 MCG/ACT AERO nasal inhaler Place into the nose.   vitamin E 400 UNIT capsule Take 400 Units by mouth daily.   [DISCONTINUED]  bisoprolol (ZEBETA) 5 MG tablet Take 5 mg by mouth daily.   No facility-administered encounter medications on file as of 09/07/2020.      [place] for general adherence call.  Since last visit with CPP, the following interventions have been made, patient has stopped [place] for now due to elevated WBC and CR labs and increased fatigue and just not feeling well.  The patient  has not had an ED visit since last contact.   The patient's current Chronic and PDC medications are:   Med  Last filled    Day supply Farxiga 10 mg  08/04/20     30 DS - currently being held by Cardiologist. [name] 49-51 mg- getting from patient assistance   The patient reports the following problems with their health recent episode of loose stools. She seems to think that it was coming from her [place].   The patient reports the following  problems with her pharmacy - states that they are still filling Entresto even though she has told the pharmacist she is getting from patient assistance.   The patient reports the following side effects with her medications - loose stools from [place].   she denies  concerns or questions for [name], Pharm. D at this time.   Patient has cardiology follow up 10/04/20 with Dr. [name] and AWV with Dr. Danise Mina 12/14/20  Follow-Up:  Pharmacist Review  [name], CPP notified  [name], [facility] Assistant [phone]  I have reviewed the care management and care coordination activities outlined in this encounter and I am certifying that I agree with the content of this note. No further action required.  [name], PharmD Clinical Pharmacist [facility] [phone]

## 2020-09-23 DIAGNOSIS — D472 Monoclonal gammopathy: Secondary | ICD-10-CM | POA: Diagnosis not present
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
## 2020-10-04 ENCOUNTER — Ambulatory Visit (INDEPENDENT_AMBULATORY_CARE_PROVIDER_SITE_OTHER): Payer: Medicare Other | Admitting: Cardiovascular Disease

## 2020-10-04 ENCOUNTER — Other Ambulatory Visit: Payer: Self-pay

## 2020-10-04 ENCOUNTER — Encounter: Payer: Self-pay | Admitting: Cardiovascular Disease

## 2020-10-04 VITALS — BP 100/70 | HR 82 | Ht 65.0 in | Wt 183.0 lb

## 2020-10-04 DIAGNOSIS — Z95810 Presence of automatic (implantable) cardiac defibrillator: Secondary | ICD-10-CM

## 2020-10-04 DIAGNOSIS — I700 Atherosclerosis of aorta: Secondary | ICD-10-CM

## 2020-10-04 DIAGNOSIS — I10 Essential (primary) hypertension: Secondary | ICD-10-CM | POA: Diagnosis not present

## 2020-10-04 DIAGNOSIS — E782 Mixed hyperlipidemia: Secondary | ICD-10-CM | POA: Diagnosis not present

## 2020-10-04 DIAGNOSIS — I420 Dilated cardiomyopathy: Secondary | ICD-10-CM | POA: Diagnosis not present

## 2020-10-04 DIAGNOSIS — I5022 Chronic systolic (congestive) heart failure: Secondary | ICD-10-CM | POA: Diagnosis not present

## 2020-10-04 DIAGNOSIS — H2513 Age-related nuclear cataract, bilateral: Secondary | ICD-10-CM | POA: Diagnosis not present

## 2020-10-04 NOTE — Patient Instructions (Addendum)
Medication Instructions:  No changes  If you need a refill on your cardiac medications before your next appointment, please call your pharmacy.    Lab work: No new labs needed   If you have labs (blood work) drawn today and your tests are completely normal, you will receive your results only by: . MyChart Message (if you have MyChart) OR . A paper copy in the mail If you have any lab test that is abnormal or we need to change your treatment, we will call you to review the results.   Testing/Procedures: No new testing needed   Follow-Up: At [facility], you and your health needs are our priority.  As part of our continuing mission to provide you with exceptional heart care, we have created designated Provider Care Teams.  These Care Teams include your primary Cardiologist (physician) and Advanced Practice Providers (APPs -  Physician Assistants and Nurse Practitioners) who all work together to provide you with the care you need, when you need it.  . You will need a follow up appointment in 6 months  . Providers on your designated Care Team:   . Christopher Berge, NP . Ryan Dunn, PA-C . Jacquelyn Visser, PA-C  Any Other Special Instructions Will Be Listed Below (If Applicable).  COVID-19 Vaccine Information can be found at: [email or web address] For questions related to vaccine distribution or appointments, please email [email or web address] or call [phone].     

## 2020-10-06 ENCOUNTER — Telehealth: Payer: Self-pay | Admitting: Cardiovascular Disease

## 2020-10-06 NOTE — Telephone Encounter (Signed)
Notes faxed to surgeon. This phone note will be removed from the preop pool. Richardson Dopp, PA-C  10/06/2020 5:53 PM

## 2020-10-06 NOTE — Telephone Encounter (Signed)
   Patient Name: Ashley [name]  DOB: [birth year]  MRN: [id number]   Primary Cardiologist: [name], MD  Chart reviewed as part of pre-operative protocol coverage. Cataract extractions are recognized in guidelines as low risk surgeries that do not typically require specific preoperative testing or holding of blood thinner therapy. Therefore, given past medical history and time since last visit, based on ACC/AHA guidelines, Ashley [name] would be at acceptable risk for the planned procedure without further cardiovascular testing.   Please call with questions. Richardson Dopp, PA-C 10/06/2020, 5:51 PM

## 2020-10-06 NOTE — Telephone Encounter (Signed)
   [facility] Pre-operative Risk Assessment    Patient Name: Ashley [name]  DOB: Oct 15, 1945  MRN: [id number]   HEARTCARE STAFF: - Please ensure there is not already an duplicate clearance open for this procedure. - Under Visit Info/Reason for Call, type in Other and utilize the format Clearance MM/DD/YY or Clearance TBD. Do not use dashes or single digits. - If request is for dental extraction, please clarify the # of teeth to be extracted.  Request for surgical clearance:  1. What type of surgery is being performed? Cataract extraction with intraocular lens implant in the right eye  2. When is this surgery scheduled? TBD  3. What type of clearance is required (medical clearance vs. Pharmacy clearance to hold med vs. Both)? both  4. Are there any medications that need to be held prior to surgery and how long? Not listed, please advise if needed  5. Practice name and name of physician performing surgery? [facility], Dr Gwyndolyn Saxon Porfilio  6. What is the office phone number? [phone]   7.   What is the office fax number? [phone]  8.   Anesthesia type (None, local, MAC, general) ? Topical    Ace Gins 10/06/2020, 4:24 PM  _________________________________________________________________   (provider comments below)

## 2020-10-14 DIAGNOSIS — K5900 Constipation, unspecified: Secondary | ICD-10-CM | POA: Diagnosis not present

## 2020-10-15 ENCOUNTER — Ambulatory Visit (INDEPENDENT_AMBULATORY_CARE_PROVIDER_SITE_OTHER): Payer: Medicare Other | Admitting: Family Medicine

## 2020-10-15 ENCOUNTER — Other Ambulatory Visit: Payer: Self-pay

## 2020-10-15 ENCOUNTER — Encounter: Payer: Self-pay | Admitting: Family Medicine

## 2020-10-15 VITALS — BP 112/60 | HR 79 | Temp 97.8°F | Ht 65.0 in | Wt 184.0 lb

## 2020-10-15 DIAGNOSIS — M545 Low back pain, unspecified: Secondary | ICD-10-CM

## 2020-10-15 DIAGNOSIS — N390 Urinary tract infection, site not specified: Secondary | ICD-10-CM | POA: Diagnosis not present

## 2020-10-15 DIAGNOSIS — R109 Unspecified abdominal pain: Secondary | ICD-10-CM

## 2020-10-15 LAB — CBC WITH DIFFERENTIAL/PLATELET
Basophils Absolute: 0.1 10*3/uL (ref 0.0–0.1)
Basophils Relative: 1 % (ref 0.0–3.0)
Eosinophils Absolute: 0.2 10*3/uL (ref 0.0–0.7)
Eosinophils Relative: 1.8 % (ref 0.0–5.0)
HCT: 39.5 % (ref 36.0–46.0)
Hemoglobin: 12.9 g/dL (ref 12.0–15.0)
Lymphocytes Relative: 23.6 % (ref 12.0–46.0)
Lymphs Abs: 2.2 10*3/uL (ref 0.7–4.0)
MCHC: 32.7 g/dL (ref 30.0–36.0)
MCV: 90.8 fl (ref 78.0–100.0)
Monocytes Absolute: 0.8 10*3/uL (ref 0.1–1.0)
Monocytes Relative: 8.3 % (ref 3.0–12.0)
Neutro Abs: 6.1 10*3/uL (ref 1.4–7.7)
Neutrophils Relative %: 65.3 % (ref 43.0–77.0)
Platelets: 224 10*3/uL (ref 150.0–400.0)
RBC: 4.35 Mil/uL (ref 3.87–5.11)
RDW: 12.8 % (ref 11.5–15.5)
WBC: 9.3 10*3/uL (ref 4.0–10.5)

## 2020-10-15 LAB — POC URINALSYSI DIPSTICK (AUTOMATED)
Bilirubin, UA: NEGATIVE
Blood, UA: NEGATIVE
Glucose, UA: NEGATIVE
Ketones, UA: NEGATIVE
Nitrite, UA: NEGATIVE
Protein, UA: NEGATIVE
Spec Grav, UA: >=1.03 — AB (ref 1.010–1.025)
Urobilinogen, UA: 0.2 E.U./dL
pH, UA: 6 (ref 5.0–8.0)

## 2020-10-15 MED ORDER — LEVOFLOXACIN 750 MG PO TABS: 750.0000 mg | ORAL_TABLET | ORAL | 0 refills | Status: DC

## 2020-10-15 NOTE — Patient Instructions (Addendum)
Possible UTI. Labs today  Urine culture sent. Start antibiotic sent to pharmacy.  Increase water by 1-2 8oz cups/day.  Let us know if not improving with treatment.

## 2020-10-15 NOTE — Assessment & Plan Note (Addendum)
Concern for complicated UTI given duration and R flank pain endorsed. UA suspicious for infection. Discussed abx treatment options in setting of several intolerances - pt prefers levaquin as she's tolerated well in the past. Will Rx levaquin 750mg  QOD (renally dosed) for 3 doses.  Update if not improving with treatment.  UCx sent.

## 2020-10-15 NOTE — Progress Notes (Signed)
Patient ID: Ashley [name], female    DOB: May 06, 1946, 75 y.o.   MRN: [id number]  This visit was conducted in person.  BP 112/60   Pulse 79   Temp 97.8 F (36.6 C) (Temporal)   Ht 5\' 5"  (1.651 m)   Wt 184 lb (83.5 kg)   SpO2 95%   BMI 30.62 kg/m    CC: low back pain  Subjective:   HPI: Ashley [name] is a 75 y.o. female presenting on 10/15/2020 for Back Pain (C/o low back pain, urine smells like metal and leg cramps.  Sxs started about 1 mo ago. )   Upcoming cataract surgery.   1 mo h/o back pain associated with metallic smell in urine and leg cramping worse at night time. Marked fatigue. Pain to R flank when she bends over. Ongoing LUQ discomfort - but better than previously. Occasional urge urinary incontinence leading to accidents - leaking. Notes increased urinary frequency and urgency but no dysuria. Intermittent dyspnea associated with daily coughing fits.   No chest pain, leg swelling, dizziness, blood in stool or urine.   Continues outpatient PFPT through [facility] for chronic constipation. Planning to start rectal biofeedback.   Recently saw cardiology and p[ulmonology - notes reviewed. Only on entresto once daily. Continues qvar 1 puff bid with PRN xopenex and tessalon perls - suspect asthma.      Relevant past medical, surgical, family and social history reviewed and updated as indicated. Interim medical history since our last visit reviewed. Allergies and medications reviewed and updated. Outpatient Medications Prior to Visit  Medication Sig Dispense Refill  . acetaminophen (TYLENOL) 500 MG tablet Take 500 mg by mouth daily as needed for moderate pain or headache (PAIN).     Marland Kitchen ALPRAZolam (XANAX) 0.5 MG tablet Take by mouth.    . AMBULATORY NON FORMULARY MEDICATION Medication Name:  1 squatty potty to use as needed 1 each 0  . Ascorbic Acid (VITAMIN C) 1000 MG tablet Take 1,000 mg by mouth See admin instructions. Take 1000 mg daily, may take an additional 1000  mg as needed for cold symptoms    . aspirin EC 81 MG tablet Take 81 mg by mouth at bedtime.     . beclomethasone (QVAR REDIHALER) 80 MCG/ACT inhaler Inhale 1 puff into the lungs 2 (two) times daily. 1 each 2  . benzonatate (TESSALON) 200 MG capsule Take 1 capsule (200 mg total) by mouth 3 (three) times daily as needed for cough. 30 capsule 6  . Biotin 10 MG CAPS Take 1 tablet by mouth daily.    . carvedilol (COREG) 6.25 MG tablet TAKE 0.5 TABLETS (3.125 MG TOTAL) BY MOUTH 2 (TWO) TIMES DAILY WITH A MEAL. 90 tablet 0  . cholecalciferol (VITAMIN D) 1000 units tablet Take 1,000 Units by mouth daily.     . Coenzyme Q10 (COQ-10) 100 MG CAPS Take 100 mg by mouth daily.    Marland Kitchen dextromethorphan (DELSYM) 30 MG/5ML liquid Take 15 mg by mouth daily as needed for cough.     . ezetimibe (ZETIA) 10 MG tablet Take 1 tablet (10 mg total) by mouth daily. 90 tablet 3  . fluorouracil (EFUDEX) 5 % cream Apply topically.    Marland Kitchen GARLIC PO Take 1 capsule by mouth daily.    Marland Kitchen guaiFENesin (MUCINEX) 600 MG 12 hr tablet Take 600 mg by mouth 2 (two) times daily.    Marland Kitchen levalbuterol (XOPENEX HFA) 45 MCG/ACT inhaler Inhale 2 puffs into the lungs every 6 (  six) hours as needed for wheezing or shortness of breath. 15 g 3  . LORazepam (ATIVAN) 0.5 MG tablet Take 0.5-1 tablets (0.25-0.5 mg total) by mouth at bedtime as needed for anxiety or sleep. 30 tablet 1  . magnesium 30 MG tablet Take 30 mg by mouth daily.    . Menthol, Topical Analgesic, (BIOFREEZE ROLL-ON EX) Apply topically.    . miconazole (MICOTIN) 2 % powder Apply 1 application topically as needed (yeast).    . nystatin (MYCOSTATIN) 100000 UNIT/ML suspension Take 5 mLs (500,000 Units total) by mouth 3 (three) times daily. 120 mL 1  . nystatin cream (MYCOSTATIN) Apply 1 application topically as needed for dry skin (yeast infection). 30 g 3  . Omega-3 Fatty Acids (FISH OIL) 1000 MG CAPS Take 1,000 capsules by mouth daily.    . pantoprazole (PROTONIX) 40 MG tablet Take 1 tablet  (40 mg total) by mouth every Monday, Wednesday, and Friday.    . potassium chloride (KLOR-CON) 10 MEQ tablet Take 10 mEq by mouth 3 (three) times a week.    . pyridOXINE (VITAMIN B-6) 100 MG tablet Take 100 mg by mouth daily.    . Red Yeast Rice Extract (RED YEAST RICE PO) Take 1 capsule by mouth in the morning and at bedtime.     . sacubitril-valsartan (ENTRESTO) 49-51 MG Take 1 tablet by mouth daily.    Marland Kitchen torsemide (DEMADEX) 10 MG tablet Take 10 mg by mouth 3 (three) times a week.    . triamcinolone (NASACORT) 55 MCG/ACT AERO nasal inhaler Place into the nose.    . vitamin E 400 UNIT capsule Take 400 Units by mouth daily.    Marland Kitchen spironolactone (ALDACTONE) 25 MG tablet Take 1 tablet (25 mg total) by mouth daily. 90 tablet 3   No facility-administered medications prior to visit.     Per HPI unless specifically indicated in ROS section below Review of Systems Objective:  BP 112/60   Pulse 79   Temp 97.8 F (36.6 C) (Temporal)   Ht 5\' 5"  (1.651 m)   Wt 184 lb (83.5 kg)   SpO2 95%   BMI 30.62 kg/m   Wt Readings from Last 3 Encounters:  10/15/20 184 lb (83.5 kg)  10/04/20 183 lb (83 kg)  08/25/20 194 lb 12.8 oz (88.4 kg)      Physical Exam Vitals and nursing note reviewed.  Constitutional:      Appearance: Normal appearance. She is not ill-appearing.  Cardiovascular:     Rate and Rhythm: Normal rate and regular rhythm.     Pulses: Normal pulses.     Heart sounds: Normal heart sounds. No murmur heard.   Pulmonary:     Effort: Pulmonary effort is normal. No respiratory distress.     Breath sounds: No wheezing, rhonchi or rales.  Abdominal:     General: Bowel sounds are normal. There is no distension.     Palpations: Abdomen is soft. There is no mass.     Tenderness: There is generalized abdominal tenderness (mild-mod). There is no right CVA tenderness, left CVA tenderness, guarding or rebound.     Hernia: No hernia is present.  Musculoskeletal:     Right lower leg: No edema.      Left lower leg: No edema.  Skin:    General: Skin is warm and dry.     Findings: No rash.  Neurological:     Mental Status: She is alert.  Psychiatric:        Mood  and Affect: Mood normal.        Behavior: Behavior normal.       Results for orders placed or performed in visit on 10/15/20  POCT Urinalysis Dipstick (Automated)  Result Value Ref Range   Color, UA yellow    Clarity, UA clear    Glucose, UA Negative Negative   Bilirubin, UA negative    Ketones, UA negative    Spec Grav, UA >=1.030 (A) 1.010 - 1.025   Blood, UA negative    pH, UA 6.0 5.0 - 8.0   Protein, UA Negative Negative   Urobilinogen, UA 0.2 0.2 or 1.0 E.U./dL   Nitrite, UA negative    Leukocytes, UA Small (1+) (A) Negative   *Note: Due to a large number of results and/or encounters for the requested time period, some results have not been displayed. A complete set of results can be found in Results Review.   Assessment & Plan:  This visit occurred during the SARS-CoV-2 public health emergency.  Safety protocols were in place, including screening questions prior to the visit, additional usage of staff PPE, and extensive cleaning of exam room while observing appropriate contact time as indicated for disinfecting solutions.   Problem List Items Addressed This Visit    Complicated UTI (urinary tract infection) - Primary    Concern for complicated UTI given duration and R flank pain endorsed. UA suspicious for infection. Discussed abx treatment options in setting of several intolerances - pt prefers levaquin as she's tolerated well in the past. Will Rx levaquin 750mg  QOD (renally dosed) for 3 doses.  Update if not improving with treatment.  UCx sent.       Right flank pain   Relevant Orders   Renal function panel   CBC with Differential/Platelet   Urine Culture    Other Visit Diagnoses    Low back pain, unspecified back pain laterality, unspecified chronicity, unspecified whether sciatica present        Relevant Orders   POCT Urinalysis Dipstick (Automated) (Completed)       Meds ordered this encounter  Medications  . levofloxacin (LEVAQUIN) 750 MG tablet    Sig: Take 1 tablet (750 mg total) by mouth every other day.    Dispense:  3 tablet    Refill:  0   Orders Placed This Encounter  Procedures  . Urine Culture  . Renal function panel  . CBC with Differential/Platelet  . POCT Urinalysis Dipstick (Automated)    Patient Instructions  Possible UTI. Labs today  Urine culture sent. Start antibiotic sent to pharmacy.  Increase water by 1-2 8oz cups/day.  Let us know if not improving with treatment.    Follow up plan: Return if symptoms worsen or fail to improve.  [name], MD

## 2020-10-16 LAB — URINE CULTURE
MICRO NUMBER:: 11966529
Result:: NO GROWTH
SPECIMEN QUALITY:: ADEQUATE

## 2020-10-18 ENCOUNTER — Telehealth: Payer: Self-pay | Admitting: Pulmonary Disease

## 2020-10-18 LAB — RENAL FUNCTION PANEL
Albumin: 4.2 g/dL (ref 3.5–5.2)
BUN: 18 mg/dL (ref 6–23)
CO2: 21 mEq/L (ref 19–32)
Calcium: 10.2 mg/dL (ref 8.4–10.5)
Chloride: 105 mEq/L (ref 96–112)
Creatinine, Ser: 0.93 mg/dL (ref 0.40–1.20)
GFR: 60.23 mL/min (ref >60.00)
Glucose, Bld: 97 mg/dL (ref 70–99)
Phosphorus: 3.8 mg/dL (ref 2.3–4.6)
Potassium: 4.7 mEq/L (ref 3.5–5.1)
Sodium: 140 mEq/L (ref 135–145)

## 2020-10-18 NOTE — Telephone Encounter (Signed)
Received letter from [facility] stating that patient has not filled qvar inhaler in the past 180 days.  Per last OV note, patient was instructed to continue Qvar. Spoke to patient, who stated that she is using qvar PRN. She stated that she had previously picked up 2 inhaler in one month, therefore she has not needed a refill. I have made Dr. [name] aware of this verbally.  Nothing further needed at this time.

## 2020-10-20 DIAGNOSIS — H2511 Age-related nuclear cataract, right eye: Secondary | ICD-10-CM | POA: Diagnosis not present

## 2020-10-22 DIAGNOSIS — K5900 Constipation, unspecified: Secondary | ICD-10-CM | POA: Diagnosis not present

## 2020-10-25 ENCOUNTER — Telehealth: Payer: Self-pay | Admitting: Family Medicine

## 2020-10-25 NOTE — Telephone Encounter (Signed)
Patient states that she is still having a great deal of pain in her kidneys and bilateral upper rib pain (under breasts)   Patient wanting to discuss an appt - no appts available this week.   Last Urinalysis  was normal - no UTI. Pt feels that something has to be going on with her kidneys.

## 2020-10-25 NOTE — Telephone Encounter (Signed)
Pt r/s on 10/26/20 at 4:30.

## 2020-10-25 NOTE — Telephone Encounter (Signed)
Patient calling again, requested OV first available with Dr Darnell Level  Pt states that she is having a great deal of pain in her kidneys and upper rib (bilateral below breasts) - painful to bend over.  Pt states that she completed the 3 day course of Levaquin prescribed last OV (took every other day to protect her kidneys). Pt states that since completing this her symptoms have returned.  Pt is scheduled on Monday 11/01/20 at 11am   Requesting a call back to discuss further and possibly be seen sooner this week if able.   Please advise, thanks.

## 2020-10-25 NOTE — Telephone Encounter (Addendum)
Can she come in tomorrow at 4:30? If so may cancel next week's appt.

## 2020-10-26 ENCOUNTER — Ambulatory Visit (INDEPENDENT_AMBULATORY_CARE_PROVIDER_SITE_OTHER): Payer: Medicare Other | Admitting: Family Medicine

## 2020-10-26 ENCOUNTER — Ambulatory Visit (INDEPENDENT_AMBULATORY_CARE_PROVIDER_SITE_OTHER)
Admission: RE | Admit: 2020-10-26 | Discharge: 2020-10-26 | Disposition: A | Discharge status: Home / Self Care | Payer: Medicare Other | Source: Ambulatory Visit | Attending: Family Medicine | Admitting: Family Medicine

## 2020-10-26 ENCOUNTER — Encounter: Payer: Self-pay | Admitting: Family Medicine

## 2020-10-26 ENCOUNTER — Other Ambulatory Visit: Payer: Self-pay

## 2020-10-26 VITALS — BP 122/68 | HR 100 | Temp 97.7°F | Ht 65.0 in | Wt 184.5 lb

## 2020-10-26 DIAGNOSIS — N1831 Chronic kidney disease, stage 3a: Secondary | ICD-10-CM | POA: Diagnosis not present

## 2020-10-26 DIAGNOSIS — R109 Unspecified abdominal pain: Secondary | ICD-10-CM | POA: Diagnosis not present

## 2020-10-26 DIAGNOSIS — M797 Fibromyalgia: Secondary | ICD-10-CM

## 2020-10-26 DIAGNOSIS — G8929 Other chronic pain: Secondary | ICD-10-CM | POA: Diagnosis not present

## 2020-10-26 DIAGNOSIS — N390 Urinary tract infection, site not specified: Secondary | ICD-10-CM

## 2020-10-26 DIAGNOSIS — R10A0 Flank pain, unspecified side: Secondary | ICD-10-CM

## 2020-10-26 DIAGNOSIS — K582 Mixed irritable bowel syndrome: Secondary | ICD-10-CM

## 2020-10-26 DIAGNOSIS — F411 Generalized anxiety disorder: Secondary | ICD-10-CM | POA: Diagnosis not present

## 2020-10-26 DIAGNOSIS — I5022 Chronic systolic (congestive) heart failure: Secondary | ICD-10-CM | POA: Diagnosis not present

## 2020-10-26 DIAGNOSIS — R1084 Generalized abdominal pain: Secondary | ICD-10-CM

## 2020-10-26 DIAGNOSIS — K219 Gastro-esophageal reflux disease without esophagitis: Secondary | ICD-10-CM | POA: Diagnosis not present

## 2020-10-26 DIAGNOSIS — K5909 Other constipation: Secondary | ICD-10-CM

## 2020-10-26 LAB — POC URINALSYSI DIPSTICK (AUTOMATED)
Bilirubin, UA: NEGATIVE
Blood, UA: NEGATIVE
Glucose, UA: NEGATIVE
Ketones, UA: NEGATIVE
Leukocytes, UA: NEGATIVE
Nitrite, UA: NEGATIVE
Protein, UA: NEGATIVE
Spec Grav, UA: >=1.03 — AB (ref 1.010–1.025)
Urobilinogen, UA: 0.2 E.U./dL
pH, UA: 5.5 (ref 5.0–8.0)

## 2020-10-26 MED ORDER — TRAMADOL HCL 50 MG PO TABS: 25.0000 mg | ORAL_TABLET | Freq: Two times a day (BID) | ORAL | 0 refills | Status: AC | PRN

## 2020-10-26 NOTE — Progress Notes (Signed)
Patient ID: Ashley [name], female    DOB: 1945-11-13, 75 y.o.   MRN: [id number]  This visit was conducted in person.  BP 122/68   Pulse 100   Temp 97.7 F (36.5 C) (Temporal)   Ht 5\' 5"  (1.651 m)   Wt 184 lb 8 oz (83.7 kg)   SpO2 96%   BMI 30.70 kg/m    CC: ongoing chest pain/flank pain Subjective:   HPI: Ashley [name] is a 75 y.o. female presenting on 10/26/2020 for Chest Pain (C/o rib pain on both sides. ), Flank Pain (C/o bilateral flank pain. ), and GI Problem (C/o 2 episodes of diarrhea then constipation where she had to manually remove stool. )   See prior note for details.  Seen 10d ago with 5mo h/o R flank pain associated with urinary changes and marked fatigue. Treated for presumed UTI with renally dosed levaquin course however UCs returned no growth.   Symptoms have progressed over the past 1.5 wks - feeling extreme soreness to bilateral back (extended m R flank to now involving both sides).  2 days ago had large bout of diarrhea, smaller amount yesterday. Today moving more towards constipation needing manual disimpaction.  Ongoing fatigue and exercise intolerance. Ongoing leg cramping.  Any movement worsens back pain.  No inciting trauma/injury or falls.  Some nausea, chills, no fever or vomiting. No blood in stool or urine.   Drinking decaf tea and water.  Continues PFPT - some trouble tolerating sessions.   No chest pain, leg swelling, dizziness, blood in stool or urine.   Recently saw cardiology and p[ulmonology - notes reviewed. Only on entresto once daily. Continues qvar 1 puff bid with PRN xopenex and tessalon perls - suspect asthma.   For constipation - rakes prunes, occasional miralax, drinks water.  Tylenol hasn't helped.  Colace softens stool.      Relevant past medical, surgical, family and social history reviewed and updated as indicated. Interim medical history since our last visit reviewed. Allergies and medications reviewed and  updated. Outpatient Medications Prior to Visit  Medication Sig Dispense Refill   acetaminophen (TYLENOL) 500 MG tablet Take 500 mg by mouth daily as needed for moderate pain or headache (PAIN).      AMBULATORY NON FORMULARY MEDICATION Medication Name:  1 squatty potty to use as needed 1 each 0   Ascorbic Acid (VITAMIN C) 1000 MG tablet Take 1,000 mg by mouth See admin instructions. Take 1000 mg daily, may take an additional 1000 mg as needed for cold symptoms     aspirin EC 81 MG tablet Take 81 mg by mouth at bedtime.      beclomethasone (QVAR REDIHALER) 80 MCG/ACT inhaler Inhale 1 puff into the lungs 2 (two) times daily. 1 each 2   benzonatate (TESSALON) 200 MG capsule Take 1 capsule (200 mg total) by mouth 3 (three) times daily as needed for cough. 30 capsule 6   Biotin 10 MG CAPS Take 1 tablet by mouth daily.     carvedilol (COREG) 6.25 MG tablet TAKE 0.5 TABLETS (3.125 MG TOTAL) BY MOUTH 2 (TWO) TIMES DAILY WITH A MEAL. 90 tablet 0   cholecalciferol (VITAMIN D) 1000 units tablet Take 1,000 Units by mouth daily.      Coenzyme Q10 (COQ-10) 100 MG CAPS Take 100 mg by mouth daily.     dextromethorphan (DELSYM) 30 MG/5ML liquid Take 15 mg by mouth daily as needed for cough.      ezetimibe (ZETIA) 10  MG tablet Take 1 tablet (10 mg total) by mouth daily. 90 tablet 3   fluorouracil (EFUDEX) 5 % cream Apply topically.     GARLIC PO Take 1 capsule by mouth daily.     guaiFENesin (MUCINEX) 600 MG 12 hr tablet Take 600 mg by mouth 2 (two) times daily.     levalbuterol (XOPENEX HFA) 45 MCG/ACT inhaler Inhale 2 puffs into the lungs every 6 (six) hours as needed for wheezing or shortness of breath. 15 g 3   levofloxacin (LEVAQUIN) 750 MG tablet Take 1 tablet (750 mg total) by mouth every other day. 3 tablet 0   LORazepam (ATIVAN) 0.5 MG tablet Take 0.5-1 tablets (0.25-0.5 mg total) by mouth at bedtime as needed for anxiety or sleep. 30 tablet 1   magnesium 30 MG tablet Take 30 mg by mouth daily.      Menthol, Topical Analgesic, (BIOFREEZE ROLL-ON EX) Apply topically.     miconazole (MICOTIN) 2 % powder Apply 1 application topically as needed (yeast).     nystatin (MYCOSTATIN) 100000 UNIT/ML suspension Take 5 mLs (500,000 Units total) by mouth 3 (three) times daily. 120 mL 1   nystatin cream (MYCOSTATIN) Apply 1 application topically as needed for dry skin (yeast infection). 30 g 3   Omega-3 Fatty Acids (FISH OIL) 1000 MG CAPS Take 1,000 capsules by mouth daily.     pantoprazole (PROTONIX) 40 MG tablet Take 1 tablet (40 mg total) by mouth every Monday, Wednesday, and Friday.     potassium chloride (KLOR-CON) 10 MEQ tablet Take 10 mEq by mouth 3 (three) times a week.     pyridOXINE (VITAMIN B-6) 100 MG tablet Take 100 mg by mouth daily.     Red Yeast Rice Extract (RED YEAST RICE PO) Take 1 capsule by mouth in the morning and at bedtime.      sacubitril-valsartan (ENTRESTO) 49-51 MG Take 1 tablet by mouth daily.     torsemide (DEMADEX) 10 MG tablet Take 10 mg by mouth 3 (three) times a week.     triamcinolone (NASACORT) 55 MCG/ACT AERO nasal inhaler Place into the nose.     vitamin E 400 UNIT capsule Take 400 Units by mouth daily.     ALPRAZolam (XANAX) 0.5 MG tablet Take by mouth.     spironolactone (ALDACTONE) 25 MG tablet Take 1 tablet (25 mg total) by mouth daily. 90 tablet 3   No facility-administered medications prior to visit.     Per HPI unless specifically indicated in ROS section below Review of Systems Objective:  BP 122/68   Pulse 100   Temp 97.7 F (36.5 C) (Temporal)   Ht 5\' 5"  (1.651 m)   Wt 184 lb 8 oz (83.7 kg)   SpO2 96%   BMI 30.70 kg/m   Wt Readings from Last 3 Encounters:  10/26/20 184 lb 8 oz (83.7 kg)  10/15/20 184 lb (83.5 kg)  10/04/20 183 lb (83 kg)      Physical Exam Vitals and nursing note reviewed.  Constitutional:      Appearance: Normal appearance.     Comments: Uncomfortable appearing, difficulty finding comfortable position   Cardiovascular:      Rate and Rhythm: Normal rate and regular rhythm.     Pulses: Normal pulses.     Heart sounds: Normal heart sounds. No murmur heard. Pulmonary:     Effort: Pulmonary effort is normal. No respiratory distress.     Breath sounds: Normal breath sounds. No wheezing, rhonchi or rales.  Abdominal:     General: Bowel sounds are increased. There is no distension.     Palpations: Abdomen is soft. There is no hepatomegaly, splenomegaly or mass.     Tenderness: There is generalized abdominal tenderness (mild-mod). There is no right CVA tenderness, left CVA tenderness, guarding or rebound. Negative signs include Murphy's sign.  Musculoskeletal:     Right lower leg: No edema.     Left lower leg: No edema.     Comments: Mild discomfort midline thoracic spine as well as lower thoracic paraspinous mm to palpation  Skin:    General: Skin is warm and dry.     Findings: No rash.  Neurological:     Mental Status: She is alert.  Psychiatric:        Mood and Affect: Mood normal.        Behavior: Behavior normal.      Results for orders placed or performed in visit on 10/26/20  POCT Urinalysis Dipstick (Automated)  Result Value Ref Range   Color, UA yellow    Clarity, UA clear    Glucose, UA Negative Negative   Bilirubin, UA negative    Ketones, UA negative    Spec Grav, UA >=1.030 (A) 1.010 - 1.025   Blood, UA negative    pH, UA 5.5 5.0 - 8.0   Protein, UA Negative Negative   Urobilinogen, UA 0.2 0.2 or 1.0 E.U./dL   Nitrite, UA negative    Leukocytes, UA Negative Negative   *Note: Due to a large number of results and/or encounters for the requested time period, some results have not been displayed. A complete set of results can be found in Results Review.   DG Abd 2 Views CLINICAL DATA:  Abdominal pain, diarrhea  EXAM: ABDOMEN - 2 VIEW  COMPARISON:  05/31/2018  FINDINGS: Supine and upright frontal views of the abdomen and pelvis are obtained. Bowel gas pattern is unremarkable. No  obstruction or ileus. No masses or abnormal calcifications. No free gas in the greater peritoneal sac. Lung bases are clear.  IMPRESSION: 1. Unremarkable bowel gas pattern.  Electronically Signed   By: Randa Ngo M.D.   On: 08/21/2020 10:51   Assessment & Plan:  This visit occurred during the SARS-CoV-2 public health emergency.  Safety protocols were in place, including screening questions prior to the visit, additional usage of staff PPE, and extensive cleaning of exam room while observing appropriate contact time as indicated for disinfecting solutions.   Problem List Items Addressed This Visit     Irritable bowel syndrome    H/o this - however no significant cramping at this time. She instead describes back and abdominal soreness.        Fibromyalgia   Relevant Medications   traMADol (ULTRAM) 50 MG tablet   Anxiety state    Stable period with rare benzo use.        Chronic systolic CHF (congestive heart failure) (HCC)    Consider BNP if ongoing        Relevant Orders   Brain natriuretic peptide   CKD (chronic kidney disease) stage 3, GFR 30-59 ml/min (HCC)    Recent Cr actually improved. Consider updated labs if ongoing soreness.        Chronic generalized abdominal pain    Chronic issue, acute worsening.  Check acute abd series.  See below. Return for labs if not improving with below treatment.        Relevant Medications   traMADol (ULTRAM) 50 MG  tablet   Other Relevant Orders   Brain natriuretic peptide   Comprehensive metabolic panel   CBC with Differential/Platelet   Sedimentation rate   C-reactive protein   Lipase   GERD (gastroesophageal reflux disease)    On pantoprazole MWF        Chronic constipation    Alternating diarrhea/constipation, sees [facility], undergoing PFPT but has difficulty tolerating sessions.       Relevant Orders   DG Abd Acute W/Chest   UTI (urinary tract infection)    Previous UCx returned normal pointing against  UTI.        Bilateral flank pain - Primary    UA normal, recently completed renally dosed levaquin course for presumed UTI however UCx returned normal. I suspect a component of MSK pain with referred pain from thoracic spine with thoracic paraspinous mm tightness. Rec trial tramadol course for pain. If not improving with this, low threshold for further labwork which was ordered today.        Relevant Orders   POCT Urinalysis Dipstick (Automated) (Completed)   DG Abd Acute W/Chest   Comprehensive metabolic panel   CBC with Differential/Platelet   Sedimentation rate   C-reactive protein     Meds ordered this encounter  Medications   traMADol (ULTRAM) 50 MG tablet    Sig: Take 0.5-1 tablets (25-50 mg total) by mouth 2 (two) times daily as needed for up to 5 days for moderate pain.    Dispense:  10 tablet    Refill:  0    Orders Placed This Encounter  Procedures   DG Abd Acute W/Chest    Standing Status:   Future    Number of Occurrences:   1    Standing Expiration Date:   10/26/2021    Order Specific Question:   Reason for Exam (SYMPTOM  OR DIAGNOSIS REQUIRED)    Answer:   dyspnea, abd pain    Order Specific Question:   Preferred imaging location?    Answer:   [facility]   Brain natriuretic peptide    Standing Status:   Future    Standing Expiration Date:   10/27/2021   Comprehensive metabolic panel    Standing Status:   Future    Standing Expiration Date:   10/27/2021   CBC with Differential/Platelet    Standing Status:   Future    Standing Expiration Date:   10/27/2021   Sedimentation rate    Standing Status:   Future    Standing Expiration Date:   10/27/2021   C-reactive protein    Standing Status:   Future    Standing Expiration Date:   10/27/2021   Lipase    Standing Status:   Future    Standing Expiration Date:   10/27/2021   POCT Urinalysis Dipstick (Automated)     Patient Instructions  Urine looked concentrated, possible constipation on imaging.   Increase water intake.  I think component is coming from muscles/back.  Try tramadol 1/2-1 tablet twice daily as needed.  Call us tomorrow with update. If no better, schedule lab visit.    Follow up plan: No follow-ups on file.  [name], MD

## 2020-10-26 NOTE — Patient Instructions (Addendum)
Urine looked concentrated, possible constipation on imaging.  Increase water intake.  I think component is coming from muscles/back.  Try tramadol 1/2-1 tablet twice daily as needed.  Call us tomorrow with update. If no better, schedule lab visit.

## 2020-10-27 ENCOUNTER — Encounter: Payer: Self-pay | Admitting: Family Medicine

## 2020-10-27 NOTE — Assessment & Plan Note (Signed)
On pantoprazole MWF

## 2020-10-27 NOTE — Assessment & Plan Note (Signed)
Recent Cr actually improved. Consider updated labs if ongoing soreness.

## 2020-10-27 NOTE — Assessment & Plan Note (Signed)
Stable period with rare benzo use.

## 2020-10-27 NOTE — Assessment & Plan Note (Signed)
UA normal, recently completed renally dosed levaquin course for presumed UTI however UCx returned normal. I suspect a component of MSK pain with referred pain from thoracic spine with thoracic paraspinous mm tightness. Rec trial tramadol course for pain. If not improving with this, low threshold for further labwork which was ordered today.

## 2020-10-27 NOTE — Assessment & Plan Note (Signed)
Previous UCx returned normal pointing against UTI.

## 2020-10-27 NOTE — Assessment & Plan Note (Signed)
Alternating diarrhea/constipation, sees [facility], undergoing PFPT but has difficulty tolerating sessions.

## 2020-10-27 NOTE — Assessment & Plan Note (Addendum)
H/o this - however no significant cramping at this time. She instead describes back and abdominal soreness.

## 2020-10-27 NOTE — Assessment & Plan Note (Signed)
Chronic issue, acute worsening.  Check acute abd series.  See below. Return for labs if not improving with below treatment.

## 2020-10-27 NOTE — Assessment & Plan Note (Signed)
Consider BNP if ongoing

## 2020-10-28 ENCOUNTER — Encounter: Payer: Self-pay | Admitting: Family Medicine

## 2020-10-29 ENCOUNTER — Other Ambulatory Visit
Admission: RE | Admit: 2020-10-29 | Discharge: 2020-10-29 | Disposition: A | Discharge status: Home / Self Care | Payer: Medicare Other | Source: Ambulatory Visit | Attending: Family Medicine | Admitting: Family Medicine

## 2020-10-29 ENCOUNTER — Other Ambulatory Visit: Payer: Self-pay | Admitting: Cardiovascular Disease

## 2020-10-29 DIAGNOSIS — R1084 Generalized abdominal pain: Secondary | ICD-10-CM | POA: Diagnosis not present

## 2020-10-29 DIAGNOSIS — G8929 Other chronic pain: Secondary | ICD-10-CM | POA: Insufficient documentation

## 2020-10-29 DIAGNOSIS — I5022 Chronic systolic (congestive) heart failure: Secondary | ICD-10-CM | POA: Diagnosis not present

## 2020-10-29 DIAGNOSIS — R109 Unspecified abdominal pain: Secondary | ICD-10-CM

## 2020-10-29 LAB — CBC WITH DIFFERENTIAL/PLATELET
Abs Immature Granulocytes: 0.04 10*3/uL (ref 0.00–0.07)
Basophils Absolute: 0 10*3/uL (ref 0.0–0.1)
Basophils Relative: 0 %
Eosinophils Absolute: 0.1 10*3/uL (ref 0.0–0.5)
Eosinophils Relative: 1 %
HCT: 41.1 % (ref 36.0–46.0)
Hemoglobin: 13.7 g/dL (ref 12.0–15.0)
Immature Granulocytes: 0 %
Lymphocytes Relative: 23 %
Lymphs Abs: 2.3 10*3/uL (ref 0.7–4.0)
MCH: 30.2 pg (ref 26.0–34.0)
MCHC: 33.3 g/dL (ref 30.0–36.0)
MCV: 90.7 fL (ref 80.0–100.0)
Monocytes Absolute: 0.8 10*3/uL (ref 0.1–1.0)
Monocytes Relative: 8 %
Neutro Abs: 6.6 10*3/uL (ref 1.7–7.7)
Neutrophils Relative %: 68 %
Platelets: 277 10*3/uL (ref 150–400)
RBC: 4.53 MIL/uL (ref 3.87–5.11)
RDW: 12.8 % (ref 11.5–15.5)
WBC: 9.9 10*3/uL (ref 4.0–10.5)
nRBC: 0 % (ref 0.0–0.2)

## 2020-10-29 LAB — COMPREHENSIVE METABOLIC PANEL
ALT: 26 U/L (ref 0–44)
AST: 26 U/L (ref 15–41)
Albumin: 4.2 g/dL (ref 3.5–5.0)
Alkaline Phosphatase: 91 U/L (ref 38–126)
Anion gap: 8 (ref 5–15)
BUN: 22 mg/dL (ref 8–23)
CO2: 26 mmol/L (ref 22–32)
Calcium: 9.8 mg/dL (ref 8.9–10.3)
Chloride: 106 mmol/L (ref 98–111)
Creatinine, Ser: 0.93 mg/dL (ref 0.44–1.00)
GFR, Estimated: >60 mL/min (ref >60)
Glucose, Bld: 121 mg/dL — ABNORMAL HIGH (ref 70–99)
Potassium: 4.4 mmol/L (ref 3.5–5.1)
Sodium: 140 mmol/L (ref 135–145)
Total Bilirubin: 0.9 mg/dL (ref 0.3–1.2)
Total Protein: 8.2 g/dL — ABNORMAL HIGH (ref 6.5–8.1)

## 2020-10-29 LAB — LIPASE, BLOOD: Lipase: 42 U/L (ref 11–51)

## 2020-10-29 LAB — C-REACTIVE PROTEIN: CRP: 0.5 mg/dL (ref <1.0)

## 2020-10-29 LAB — BRAIN NATRIURETIC PEPTIDE: B Natriuretic Peptide: 23.2 pg/mL (ref 0.0–100.0)

## 2020-10-29 LAB — SEDIMENTATION RATE: Sed Rate: 58 mm/hr — ABNORMAL HIGH (ref 0–30)

## 2020-10-29 NOTE — Telephone Encounter (Addendum)
Pt left v/m that she is having both legs with leg cramping now but lt leg is worse; pt said started yesterday afternoon and lasted until 2 AM this morning; pt saw Dr Darnell Level about lower back pain and muscles cramping; pt noted lower back pain is better but pt still has the lower back pain and now legs cramping. Pt wants to know if tramadol can cause cramping. Pt is going to get labs drawn today that were ordered if pt did not get better. I spoke with pt; pt said now lt calf has bruised like feeling but there is no bruise and pain goes up to thigh. Now pain level is 4. Pt said the pain is not terrible but she is aware of pain. Pt said her lower legs are not swollen, red, or feel warm to touch;Pt said had leg cramps on and off for couple of weeks. Pt is using a large stick with rubber stopper on bottom for support of lt leg due to some weakness and pain in lt leg. No weakness in arms and no slurred speech or difficulty talking.Pt is trying to drink more water. No bruising seen and only swelling noted is lt ankle swollen. Pt said her ribs continue to hurt and pt has spoken with Dr Darnell Level about rib pain. Pt said at PT pt had to stop PT due to massage making back hurt worse. Pt is to have cataract surgery on 11/09/20 and pt wants to know if can have tramadol in system and still have cataract surgery. Pt also wanted Dr Darnell Level to know that she continues with chills on and off for 1 month and wanted Dr Darnell Level to be aware. Pt said that the xray showed probable COPD but pulmonologist had previously told pt that she could have COPD or asthma and pulmonologist was leaning toward asthma. Pt asked Dr Darnell Level see 10/26/20 result of CXR.

## 2020-10-29 NOTE — Telephone Encounter (Signed)
Agree with labwork.  Would want her to avoid tramadol around her cataract surgery - should be ok if she stops 2 days prior to cataract surgery.

## 2020-10-29 NOTE — Telephone Encounter (Signed)
Spoke with pt relaying Dr. Synthia Innocent message.  Pt verbalizes understanding and states she had labs done at the hospital.

## 2020-11-01 ENCOUNTER — Ambulatory Visit: Payer: Medicare Other | Admitting: Family Medicine

## 2020-11-02 ENCOUNTER — Encounter: Payer: Self-pay | Admitting: Anesthesiology

## 2020-11-02 ENCOUNTER — Ambulatory Visit: Payer: Medicare Other | Admitting: Family Medicine

## 2020-11-02 ENCOUNTER — Encounter: Payer: Self-pay | Admitting: Family Medicine

## 2020-11-02 NOTE — Anesthesia Preprocedure Evaluation (Deleted)
Anesthesia Evaluation    History of Anesthesia Complications (+) PONV and history of anesthetic complications  Airway        Dental   Pulmonary sleep apnea , COPD,           Cardiovascular hypertension, +CHF  + Cardiac Defibrillator + Valvular Problems/Murmurs MR   NICM   Neuro/Psych  Headaches, PSYCHIATRIC DISORDERS Anxiety    GI/Hepatic   Endo/Other    Renal/GU      Musculoskeletal  (+) Fibromyalgia -  Abdominal   Peds  Hematology  (+) Blood dyscrasia, anemia ,   Anesthesia Other Findings   Reproductive/Obstetrics                             Anesthesia Physical Anesthesia Plan  ASA: 3  Anesthesia Plan: MAC   Post-op Pain Management:    Induction: Intravenous  PONV Risk Score and Plan: 3 and TIVA, Midazolam and Treatment may vary due to age or medical condition  Airway Management Planned: Nasal Cannula  Additional Equipment:   Intra-op Plan:   Post-operative Plan:   Informed Consent:   Plan Discussed with:   Anesthesia Plan Comments:         Anesthesia Quick Evaluation

## 2020-11-03 ENCOUNTER — Encounter: Payer: Self-pay | Admitting: Ophthalmology

## 2020-11-04 NOTE — Telephone Encounter (Signed)
[place] Night - Client Nonclinical Telephone Record AccessNurse Client [facility] Night - Client Client Site [place] Physician [name] - MD Contact Type Call Who Is Calling Patient / Member / Family / Caregiver Caller Name [place] Phone Number [phone] Patient Name n/a Patient DOB Jul 04, 1945 Call Type Message Only Information Provided Reason for Call Request for Lab/Test Results Initial Comment Caller states she was put on medication that was causing leg cramps so she stopped taking it. She is also wanting blood work results from last Friday. Additional Comment Declined triage. Office hours provided. Disp. Time Disposition Final User 11/03/2020 5:05:30 PM General Information Provided Yes Uvaldo Rising Call Closed By: Uvaldo Rising Transaction Date/Time: 11/03/2020 5:00:57 PM (ET)

## 2020-11-05 NOTE — Telephone Encounter (Signed)
Replied via result note.

## 2020-11-08 ENCOUNTER — Ambulatory Visit (INDEPENDENT_AMBULATORY_CARE_PROVIDER_SITE_OTHER): Payer: Medicare Other

## 2020-11-08 DIAGNOSIS — I420 Dilated cardiomyopathy: Secondary | ICD-10-CM

## 2020-11-09 LAB — CUP PACEART REMOTE DEVICE CHECK
Battery Remaining Longevity: 65 mo
Battery Voltage: 2.98 V
Brady Statistic AP VP Percent: 0.12 %
Brady Statistic AP VS Percent: 0.01 %
Brady Statistic AS VP Percent: 99.67 %
Brady Statistic AS VS Percent: 0.2 %
Brady Statistic RA Percent Paced: 0.13 %
Brady Statistic RV Percent Paced: 99.2 %
Date Time Interrogation Session: 20220627102027
HighPow Impedance: 76 Ohm
Implantable Lead Implant Date: 20130301
Implantable Lead Implant Date: 20200622
Implantable Lead Implant Date: 20200622
Implantable Lead Location: 753859
Implantable Lead Location: 753860
Implantable Lead Location: 753860
Implantable Lead Model: 3830
Implantable Lead Model: 5076
Implantable Lead Model: 6935
Implantable Pulse Generator Implant Date: 20200622
Lead Channel Impedance Value: 209 Ohm
Lead Channel Impedance Value: 304 Ohm
Lead Channel Impedance Value: 342 Ohm
Lead Channel Impedance Value: 380 Ohm
Lead Channel Impedance Value: 399 Ohm
Lead Channel Impedance Value: 456 Ohm
Lead Channel Pacing Threshold Amplitude: 0.375 V
Lead Channel Pacing Threshold Amplitude: 0.625 V
Lead Channel Pacing Threshold Amplitude: 0.625 V
Lead Channel Pacing Threshold Pulse Width: 0.4 ms
Lead Channel Pacing Threshold Pulse Width: 0.4 ms
Lead Channel Pacing Threshold Pulse Width: 0.4 ms
Lead Channel Sensing Intrinsic Amplitude: 2.375 mV
Lead Channel Sensing Intrinsic Amplitude: 2.375 mV
Lead Channel Sensing Intrinsic Amplitude: 6.5 mV
Lead Channel Sensing Intrinsic Amplitude: 6.5 mV
Lead Channel Setting Pacing Amplitude: 1.25 V
Lead Channel Setting Pacing Amplitude: 1.5 V
Lead Channel Setting Pacing Amplitude: 2.5 V
Lead Channel Setting Pacing Pulse Width: 0.4 ms
Lead Channel Setting Pacing Pulse Width: 0.4 ms
Lead Channel Setting Sensing Sensitivity: 0.3 mV

## 2020-11-14 DIAGNOSIS — Z20822 Contact with and (suspected) exposure to covid-19: Secondary | ICD-10-CM | POA: Diagnosis not present

## 2020-11-17 ENCOUNTER — Telehealth: Payer: Self-pay

## 2020-11-17 NOTE — Chronic Care Management (AMB) (Addendum)
Chronic Care Management Pharmacy Assistant   Name: Ashley [name]  MRN: [id number] DOB: 1946/04/14   Reason for Encounter: Disease State  CHF  Recent office visits:  10/29/20 - PCP [place] lab work no medication changes 10/26/20 - Dr.Gutierrez PCP labs ordered started Tramadol 50mg  take 1 tablet 2 times daily prn  10/15/20 - PCP UTI start Levaquin 750 1tablet every other day for 3 days increase water intake   Recent consult visits:  10/22/20 - Outpatient Rehab no medication changes 10/14/20 - Outpatient Rehab no medication changes 10/04/20 - Cardiology  no medication changes 09/23/20 - [facility] OP procedure    Hospital visits:  None in previous 6 months  Medications: Outpatient Encounter Medications as of 11/17/2020  Medication Sig   acetaminophen (TYLENOL) 500 MG tablet Take 500 mg by mouth daily as needed for moderate pain or headache (PAIN).    AMBULATORY NON FORMULARY MEDICATION Medication Name:  1 squatty potty to use as needed   Ascorbic Acid (VITAMIN C) 1000 MG tablet Take 1,000 mg by mouth See admin instructions. Take 1000 mg daily, may take an additional 1000 mg as needed for cold symptoms   aspirin EC 81 MG tablet Take 81 mg by mouth at bedtime.    beclomethasone (QVAR REDIHALER) 80 MCG/ACT inhaler Inhale 1 puff into the lungs 2 (two) times daily.   benzonatate (TESSALON) 200 MG capsule Take 1 capsule (200 mg total) by mouth 3 (three) times daily as needed for cough.   Biotin 10 MG CAPS Take 1 tablet by mouth daily.   carvedilol (COREG) 6.25 MG tablet TAKE 0.5 TABLETS (3.125 MG TOTAL) BY MOUTH 2 (TWO) TIMES DAILY WITH A MEAL.   cholecalciferol (VITAMIN D) 1000 units tablet Take 1,000 Units by mouth daily.    Coenzyme Q10 (COQ-10) 100 MG CAPS Take 100 mg by mouth daily.   dextromethorphan (DELSYM) 30 MG/5ML liquid Take 15 mg by mouth daily as needed for cough.    ezetimibe (ZETIA) 10 MG tablet Take 1 tablet (10 mg total) by mouth daily.   fluorouracil (EFUDEX) 5 % cream Apply  topically.   GARLIC PO Take 1 capsule by mouth daily.   guaiFENesin (MUCINEX) 600 MG 12 hr tablet Take 600 mg by mouth 2 (two) times daily.   levalbuterol (XOPENEX HFA) 45 MCG/ACT inhaler Inhale 2 puffs into the lungs every 6 (six) hours as needed for wheezing or shortness of breath.   levofloxacin (LEVAQUIN) 750 MG tablet Take 1 tablet (750 mg total) by mouth every other day. (Patient not taking: Reported on 11/03/2020)   LORazepam (ATIVAN) 0.5 MG tablet Take 0.5-1 tablets (0.25-0.5 mg total) by mouth at bedtime as needed for anxiety or sleep.   magnesium 30 MG tablet Take 30 mg by mouth daily.   Menthol, Topical Analgesic, (BIOFREEZE ROLL-ON EX) Apply topically.   miconazole (MICOTIN) 2 % powder Apply 1 application topically as needed (yeast).   nystatin (MYCOSTATIN) 100000 UNIT/ML suspension Take 5 mLs (500,000 Units total) by mouth 3 (three) times daily.   nystatin cream (MYCOSTATIN) Apply 1 application topically as needed for dry skin (yeast infection).   Omega-3 Fatty Acids (FISH OIL) 1000 MG CAPS Take 1,000 capsules by mouth daily.   pantoprazole (PROTONIX) 40 MG tablet Take 1 tablet (40 mg total) by mouth every Monday, Wednesday, and Friday.   potassium chloride (KLOR-CON) 10 MEQ tablet Take 10 mEq by mouth 3 (three) times a week.   pyridOXINE (VITAMIN B-6) 100 MG tablet Take 100 mg by  mouth daily.   Red Yeast Rice Extract (RED YEAST RICE PO) Take 1 capsule by mouth in the morning and at bedtime.    sacubitril-valsartan (ENTRESTO) 49-51 MG Take 1 tablet by mouth daily.   spironolactone (ALDACTONE) 25 MG tablet TAKE 1 TABLET BY MOUTH EVERY DAY   torsemide (DEMADEX) 10 MG tablet Take 10 mg by mouth 3 (three) times a week.   triamcinolone (NASACORT) 55 MCG/ACT AERO nasal inhaler Place into the nose.   vitamin E 400 UNIT capsule Take 400 Units by mouth daily.   [DISCONTINUED] bisoprolol (ZEBETA) 5 MG tablet Take 5 mg by mouth daily.   No facility-administered encounter medications on file  as of 11/17/2020.    Current heart failure regimen:  Carvedilol 6.25 mg - take 1/2 tablet 2 times daily   Entresto 49-51 mg - take 1 tablet daily   Spironolactone 25 mg - take 1 tablet daily   Are you taking a diuretic? Yes How often: Torsemide 10mg  1 tablet 3 times a week  Do you weigh yourself daily or regularly? No  Have any of the following symptoms worsened or changed from your baseline?   denies worsening of SOB, increased swelling, or abnormal weight gain of more than 3 pounds in one day or 5 pounds in one week  Do you see a cardiologist? Yes             Name: [name] MD Last visit? 10/04/20 Upcoming visit? To be scheduled  How often are you checking your Blood Pressure? weekly   DATE:             BP               PULSE 11/12/20  119/74  89 11/15/20  118/62  81 11/17/20  115/69  79  Wrist or arm cuff: arm cuff  Caffeine intake: no caffeine, off diet sodas now for 2 months  OTC medications including pseudoephedrine or NSAIDs? No Exercise habits: the patient is sedentary however she engages with other people and plays games and cards   Star Rating Drugs:  Medication:  Last Fill: Day Supply Entresto 49-51mg  08/07/20 30  Entresto is still written for BID although cardiology aware pt is taking once daily. She has excess supply due to this.   [name], CPP notified  Avel Sensor, [place] Assistant [phone]  I have reviewed the care management and care coordination activities outlined in this encounter and I am certifying that I agree with the content of this note. No further action required.  [name], PharmD Clinical Pharmacist [facility] [phone]

## 2020-11-21 DIAGNOSIS — Z20822 Contact with and (suspected) exposure to covid-19: Secondary | ICD-10-CM | POA: Diagnosis not present

## 2020-11-23 ENCOUNTER — Ambulatory Visit (INDEPENDENT_AMBULATORY_CARE_PROVIDER_SITE_OTHER): Payer: Medicare Other | Admitting: Family Medicine

## 2020-11-23 ENCOUNTER — Encounter: Payer: Self-pay | Admitting: Family Medicine

## 2020-11-23 ENCOUNTER — Other Ambulatory Visit: Payer: Self-pay

## 2020-11-23 VITALS — BP 120/76 | HR 85 | Temp 97.5°F | Ht 65.0 in | Wt 184.0 lb

## 2020-11-23 DIAGNOSIS — R109 Unspecified abdominal pain: Secondary | ICD-10-CM

## 2020-11-23 DIAGNOSIS — R159 Full incontinence of feces: Secondary | ICD-10-CM

## 2020-11-23 MED ORDER — GABAPENTIN 100 MG PO CAPS
100.0000 mg | ORAL_CAPSULE | Freq: Two times a day (BID) | ORAL | 3 refills | Status: DC
Start: 2020-11-23 — End: 2021-02-10

## 2020-11-23 NOTE — Progress Notes (Signed)
Patient ID: Ashley [name], female    DOB: 07-21-1945, 75 y.o.   MRN: [id number]  This visit was conducted in person.  BP 120/76   Pulse 85   Temp (!) 97.5 F (36.4 C) (Temporal)   Ht '5\' 5"'  (1.651 m)   Wt 184 lb (83.5 kg)   SpO2 96%   BMI 30.62 kg/m    CC: rib/chest pain  Subjective:   HPI: Ashley [name] is a 75 y.o. female presenting on 11/23/2020 for Chest Pain (Rib Pain)   See prior notes for details.  Seen here last month with 1 month (now 2.5 mo) h/o bilateral flank pain/soreness associated with fatigue, cramping, urinary changes. Initial treatment for UTI with renally dosed levaquin however urine culture returned no growth. Labwork reassuring (CMP, CBC, CRP, BNP, lipase), ESR elevated at 58. 2 view abdomen - unremarkable bowel gas pattern. No inciting trauma/injury. Tramadol helped pain but may have worsened leg cramping.   Ongoing sharp stabbing 7-8/10 rib pain feels like broken ribs that comes on intermittently, as well as constant soreness to back which stays with her. Some burning pain to tender areas - points to lateral right side at lateral ribcage. No skin changes/rash.  She continues tylenol as well as biofreeze cream with benefit. Cold compress helps too.  No nausea.   Feels pain started after dermatologic procedure for skin tag removal using acid.   Chronic alternating constipation/diarrhea sees Dr [name] at [place] - takes colace, prunes, occasional miralax. Continues pantoprazole MWF. Currently constipation  > diarrhea. 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      Relevant past medical, surgical, family and social history reviewed and updated as indicated. Interim medical history since our last visit reviewed. Allergies and medications reviewed and updated. Outpatient Medications Prior to Visit  Medication Sig Dispense Refill   acetaminophen  (TYLENOL) 500 MG tablet Take 500 mg by mouth daily as needed for moderate pain or headache (PAIN).      AMBULATORY NON FORMULARY MEDICATION Medication Name:  1 squatty potty to use as needed 1 each 0   Ascorbic Acid (VITAMIN C) 1000 MG tablet Take 1,000 mg by mouth See admin instructions. Take 1000 mg daily, may take an additional 1000 mg as needed for cold symptoms     aspirin EC 81 MG tablet Take 81 mg by mouth at bedtime.      beclomethasone (QVAR REDIHALER) 80 MCG/ACT inhaler Inhale 1 puff into the lungs 2 (two) times daily. 1 each 2   benzonatate (TESSALON) 200 MG capsule Take 1 capsule (200 mg total) by mouth 3 (three) times daily as needed for cough. 30 capsule 6   Biotin 10 MG CAPS Take 1 tablet by mouth daily.     carvedilol (COREG) 6.25 MG tablet TAKE 0.5 TABLETS (3.125 MG TOTAL) BY MOUTH 2 (TWO) TIMES DAILY WITH A MEAL. 90 tablet 0   cholecalciferol (VITAMIN D) 1000 units tablet Take 1,000 Units by mouth daily.      Coenzyme Q10 (COQ-10) 100 MG CAPS Take 100 mg by mouth daily.     dextromethorphan (DELSYM) 30 MG/5ML liquid Take 15 mg by mouth daily as needed for cough.      ezetimibe (ZETIA) 10 MG tablet Take 1 tablet (10 mg total) by mouth daily. 90 tablet 3   fluorouracil (EFUDEX) 5 % cream Apply topically.     GARLIC PO Take 1 capsule by mouth  daily.     guaiFENesin (MUCINEX) 600 MG 12 hr tablet Take 600 mg by mouth 2 (two) times daily.     levalbuterol (XOPENEX HFA) 45 MCG/ACT inhaler Inhale 2 puffs into the lungs every 6 (six) hours as needed for wheezing or shortness of breath. 15 g 3   levofloxacin (LEVAQUIN) 750 MG tablet Take 1 tablet (750 mg total) by mouth every other day. 3 tablet 0   LORazepam (ATIVAN) 0.5 MG tablet Take 0.5-1 tablets (0.25-0.5 mg total) by mouth at bedtime as needed for anxiety or sleep. 30 tablet 1   magnesium 30 MG tablet Take 30 mg by mouth daily.     Menthol, Topical Analgesic, (BIOFREEZE ROLL-ON EX) Apply topically.     miconazole (MICOTIN) 2 %  powder Apply 1 application topically as needed (yeast).     nystatin (MYCOSTATIN) 100000 UNIT/ML suspension Take 5 mLs (500,000 Units total) by mouth 3 (three) times daily. 120 mL 1   nystatin cream (MYCOSTATIN) Apply 1 application topically as needed for dry skin (yeast infection). 30 g 3   Omega-3 Fatty Acids (FISH OIL) 1000 MG CAPS Take 1,000 capsules by mouth daily.     pantoprazole (PROTONIX) 40 MG tablet Take 1 tablet (40 mg total) by mouth every Monday, Wednesday, and Friday.     potassium chloride (KLOR-CON) 10 MEQ tablet Take 10 mEq by mouth 3 (three) times a week.     pyridOXINE (VITAMIN B-6) 100 MG tablet Take 100 mg by mouth daily.     Red Yeast Rice Extract (RED YEAST RICE PO) Take 1 capsule by mouth in the morning and at bedtime.      sacubitril-valsartan (ENTRESTO) 49-51 MG Take 1 tablet by mouth daily.     spironolactone (ALDACTONE) 25 MG tablet TAKE 1 TABLET BY MOUTH EVERY DAY 90 tablet 1   torsemide (DEMADEX) 10 MG tablet Take 10 mg by mouth 3 (three) times a week.     triamcinolone (NASACORT) 55 MCG/ACT AERO nasal inhaler Place into the nose.     vitamin E 400 UNIT capsule Take 400 Units by mouth daily.     No facility-administered medications prior to visit.     Per HPI unless specifically indicated in ROS section below Review of Systems  Objective:  BP 120/76   Pulse 85   Temp (!) 97.5 F (36.4 C) (Temporal)   Ht '5\' 5"'  (1.651 m)   Wt 184 lb (83.5 kg)   SpO2 96%   BMI 30.62 kg/m   Wt Readings from Last 3 Encounters:  11/23/20 184 lb (83.5 kg)  10/26/20 184 lb 8 oz (83.7 kg)  10/15/20 184 lb (83.5 kg)      Physical Exam Vitals and nursing note reviewed.  Constitutional:      Appearance: Normal appearance. She is not ill-appearing.  Cardiovascular:     Rate and Rhythm: Normal rate and regular rhythm.     Pulses: Normal pulses.     Heart sounds: Normal heart sounds. No murmur heard. Pulmonary:     Effort: Pulmonary effort is normal. No respiratory  distress.     Breath sounds: Normal breath sounds. No wheezing, rhonchi or rales.  Abdominal:     General: There is no distension.     Palpations: Abdomen is soft.     Tenderness: There is abdominal tenderness (chronic mild diffuse). There is no guarding.  Skin:    General: Skin is warm and dry.     Findings: No rash.  Comments: No skin changes noted to tender areas although she is sensitive to light touch over areas that stay sore to lateral sides R>L   Neurological:     Mental Status: She is alert.       Assessment & Plan:  This visit occurred during the SARS-CoV-2 public health emergency.  Safety protocols were in place, including screening questions prior to the visit, additional usage of staff PPE, and extensive cleaning of exam room while observing appropriate contact time as indicated for disinfecting solutions.   Problem List Items Addressed This Visit     Fecal incontinence    PFPT on hold until soreness/pain improves.  She has difficulty tolerating these sessions.        Bilateral flank pain - Primary    Describes constant soreness to bilateral flank as well as sharp stabbing episodes at ribcage, and today describes some burning and skin sensitivity. Reasonable to try gabapentin to treat possible neuropathic component. Start low dose 174m nightly x4d then increase to bid dosing. Discussed side effects to watch for. Update with effect.          Meds ordered this encounter  Medications   gabapentin (NEURONTIN) 100 MG capsule    Sig: Take 1 capsule (100 mg total) by mouth 2 (two) times daily. Take one nightly for the first 4 days    Dispense:  60 capsule    Refill:  3    No orders of the defined types were placed in this encounter.   Patient Instructions  Possible neuropathic pain - start gabapentin 1094mnightly for 4 nights and if tolerated well may increase to 10064mwice daily.  Ok to continue tylenol, 500m53mice daily as needed.  Update me with effect.    Follow up plan: Return if symptoms worsen or fail to improve.  JaviRia Bush

## 2020-11-23 NOTE — Assessment & Plan Note (Signed)
PFPT on hold until soreness/pain improves.  She has difficulty tolerating these sessions.

## 2020-11-23 NOTE — Assessment & Plan Note (Signed)
Describes constant soreness to bilateral flank as well as sharp stabbing episodes at ribcage, and today describes some burning and skin sensitivity. Reasonable to try gabapentin to treat possible neuropathic component. Start low dose 100mg  nightly x4d then increase to bid dosing. Discussed side effects to watch for. Update with effect.

## 2020-11-23 NOTE — Patient Instructions (Addendum)
Possible neuropathic pain - start gabapentin 100mg  nightly for 4 nights and if tolerated well may increase to 100mg  twice daily.  Ok to continue tylenol, 500mg  twice daily as needed.  Update me with effect.

## 2020-11-26 NOTE — Progress Notes (Signed)
Remote ICD transmission.   

## 2020-11-29 ENCOUNTER — Other Ambulatory Visit: Payer: Self-pay | Admitting: Family Medicine

## 2020-11-29 ENCOUNTER — Other Ambulatory Visit: Payer: Self-pay

## 2020-11-29 ENCOUNTER — Other Ambulatory Visit: Payer: Medicare Other

## 2020-11-29 ENCOUNTER — Telehealth (INDEPENDENT_AMBULATORY_CARE_PROVIDER_SITE_OTHER): Payer: Medicare Other | Admitting: Family Medicine

## 2020-11-29 ENCOUNTER — Telehealth: Payer: Self-pay | Admitting: *Deleted

## 2020-11-29 ENCOUNTER — Encounter: Payer: Self-pay | Admitting: Family Medicine

## 2020-11-29 VITALS — BP 108/64 | HR 104 | Temp 98.4°F | Ht 65.0 in | Wt 184.4 lb

## 2020-11-29 DIAGNOSIS — N1831 Chronic kidney disease, stage 3a: Secondary | ICD-10-CM | POA: Diagnosis not present

## 2020-11-29 DIAGNOSIS — R509 Fever, unspecified: Secondary | ICD-10-CM

## 2020-11-29 DIAGNOSIS — I5022 Chronic systolic (congestive) heart failure: Secondary | ICD-10-CM | POA: Diagnosis not present

## 2020-11-29 DIAGNOSIS — A0839 Other viral enteritis: Secondary | ICD-10-CM | POA: Insufficient documentation

## 2020-11-29 DIAGNOSIS — I420 Dilated cardiomyopathy: Secondary | ICD-10-CM | POA: Diagnosis not present

## 2020-11-29 DIAGNOSIS — U071 COVID-19: Secondary | ICD-10-CM | POA: Diagnosis not present

## 2020-11-29 MED ORDER — NIRMATRELVIR/RITONAVIR (PAXLOVID) TABLET (RENAL DOSING): 2.0000 | ORAL_TABLET | Freq: Two times a day (BID) | ORAL | 0 refills | Status: AC

## 2020-11-29 NOTE — Assessment & Plan Note (Signed)
Episode of fever last night associated with malaise, fatigue, sinus congestion, body aches, HA and leg cramping. In setting of pandemic I do recommend COVID test - she will do Ag test at home and if negative, come in for curbside PCR as early on in course of current illness. Discussed supportive care measures at home.  Doubt gabapetin related however I did recommend she hold at this time until feeling better then may retrial.

## 2020-11-29 NOTE — Telephone Encounter (Signed)
[facility] - Client TELEPHONE ADVICE RECORD AccessNurse Patient Name: Ashley [name] Gender: Female DOB: 12-Jan-1946 Age: 75 Y 41 M 12 D Return Phone Number: [phone] (Primary) Address: City/ State/ Zip: [place]  [address] Client [name] - Client Client Site [place] Physician [name] - MD Contact Type Call Who Is Calling Patient / Member / Family / Caregiver Call Type Triage / Clinical Caller Name [facility] Relationship To Patient Self Return Phone Number [phone] (Primary) Chief Complaint FEVER - >= 104 or <97 Reason for Call Symptomatic / Request for [place] states she is having chills , body aches, dizzy, body temp of 93-98. Translation No Nurse Assessment Nurse: Harvie Bridge, RN, Beth Date/Time (Eastern Time): 11/29/2020 8:51:20 AM Confirm and document reason for call. If symptomatic, describe symptoms. ---Caller states she is having chills , body aches, denies dizziness, body temp of 93. Then it was 102. Coughing. Stuffy nose. Temp 97 right now. Does the patient have any new or worsening symptoms? ---Yes Will a triage be completed? ---Yes Related visit to physician within the last 2 weeks? ---Yes Does the PT have any chronic conditions? (i.e. diabetes, asthma, this includes High risk factors for pregnancy, etc.) ---Yes List chronic conditions. ---Cardiac Asthma Nerve Pain CHF Is this a behavioral health or substance abuse call? ---No Guidelines Guideline Title Affirmed Question Affirmed Notes Nurse Date/Time (Eastern Time) Sinus Pain or Congestion Patient sounds very sick or weak to the triager [place], RN, [facility] 11/29/2020 8:55:50 AM Disp. Time [name] Time) Disposition Final User 11/29/2020 8:50:13 AM Send to Urgent Queue Vinnie Level 11/29/2020 9:18:33 AM Go to ED Now (or PCP triage) Yes [place], RN,  Beth PLEASE NOTE: All timestamps contained within this report are represented as [place] Standard Time. CONFIDENTIALTY NOTICE: This fax transmission is intended only for the addressee. It contains information that is legally privileged, confidential or otherwise protected from use or disclosure. If you are not the intended recipient, you are strictly prohibited from reviewing, disclosing, copying using or disseminating any of this information or taking any action in reliance on or regarding this information. If you have received this fax in error, please notify us immediately by telephone so that we can arrange for its return to [place]. Phone: [phone], Toll-Free: [phone], Fax: [phone] Page: 2 of 2 Call Id: [id number] [place] Disagree/Comply Disagree Caller Understands Yes PreDisposition Did not know what to do Care Advice Given Per Guideline GO TO ED NOW (OR PCP TRIAGE): * IF PCP SECOND-LEVEL TRIAGE REQUIRED: You may need to be seen. Your doctor (or NP/PA) will want to talk with you to decide what's best. I'll page the provider on-call now. If you haven't heard from the provider (or me) within 30 minutes, go directly to the [place] at _____________ Dawn: * It is better and safer if another adult drives instead of you. BRING MEDICINES: * Please bring a list of your current medicines when you go to see the doctor. CARE ADVICE given per Sinus Pain or Congestion (Adult) guideline. Comments User: Louretta Shorten, RN Date/Time [name] Time): 11/29/2020 8:55:06 AM Pacemaker/Defibulator User: Louretta Shorten, RN Date/Time [name] Time): 11/29/2020 9:22:51 AM Patient refused UC/ED. We tried reaching office several times. I finally got through to backline. Office states they will call patient back. Referrals REFERRED TO PCP OFFICE  Added another access nurse note.

## 2020-11-29 NOTE — Assessment & Plan Note (Addendum)
ADDENDUM ==> patient brought in positive COVID Ag test from home. Have cancelled PCR. Discussed with her over the phone.   Recommend: Renally dose paxlovid in h/o CKD, recent GFR 60 Drug interactions: 1. RYR - hold while on antiviral  2. nasacort - hold while on antiviral  Reviewed currently approved EUA treatments.  Reviewed expected course of illness, anticipated course of recovery, as well as red flags to suggested COVID pneumonia or to seek urgent in-person care. Reviewed latest CDC isolation/quarantining guidelines.  Encouraged fluids and rest. Reviewed further supportive care measures at home including vit C, vit D, zinc, tylenol PRN.

## 2020-11-29 NOTE — Addendum Note (Signed)
Addended by: [name] on: 11/29/2020 05:26 PM   Modules accepted: Orders, Level of Service

## 2020-11-29 NOTE — Telephone Encounter (Signed)
Ms. Ashley [name] came in this afternoon for Covid testing.  She brought a home test she had done and it was positive.  Dr. Darnell Level notified.  PCR test cancelled.  Patient is very anxious about getting medication started.

## 2020-11-29 NOTE — Telephone Encounter (Signed)
[facility] - Client Nonclinical Telephone Record  [facility] Client [facility] Night - Client Client Site [place] Physician [name] - MD Contact Type Call Who Is Calling Patient / Member / Family / Caregiver Caller Name [name] Phone Number [phone] Patient Name Ashley [name] Patient DOB 10-Oct-1945 Call Type Message Only Information Provided Reason for Call Request for General Office Information Initial Comment Caller states she is needing to discuss an appointment change. Disp. Time Disposition Final User 11/29/2020 7:26:12 AM General Information Provided Yes Domingo Dimes, [place] Call Closed By: Memory Argue Transaction Date/Time: 11/29/2020 7:23:28 AM (ET)  Pt had a VV with PCP today.

## 2020-11-29 NOTE — Telephone Encounter (Signed)
Patient called stating that she thinks that she has a sinus infection. Patient stated that she had a fever yesterday of 102.0. Patient stated that she has a stuffy nose, cough, chills, leg cramps and a headache. Patient stated that she does not have any more SOB than she usually does. Patient scheduled for a virtual visit with Dr. Danise Mina today at 12:30. Patient was given ER precautions and she verbalized understanding.

## 2020-11-29 NOTE — Telephone Encounter (Signed)
Noted. I spoke with patient today.

## 2020-11-29 NOTE — Progress Notes (Addendum)
Patient ID: Ashley [name], female    DOB: [birth year], 75 y.o.   MRN: [id number]  Virtual visit completed through [employer], a video enabled telemedicine application. Due to national recommendations of social distancing due to COVID-19, a virtual visit is felt to be most appropriate for this patient at this time. Reviewed limitations, risks, security and privacy concerns of performing a virtual visit and the availability of in person appointments. I also reviewed that there may be a patient responsible charge related to this service. The patient agreed to proceed.   Patient location: home Provider location: [place] at [facility], office Persons participating in this virtual visit: patient, provider   If any vitals were documented, they were collected by patient at home unless specified below.    BP 108/64   Pulse (!) 104   Temp 98.4 F (36.9 C)   Ht 5\' 5"  (1.651 m)   Wt 184 lb 7 oz (83.7 kg)   BMI 30.69 kg/m    CC: fever, chills, body aches Subjective:   HPI: Ashley [name] is a 75 y.o. female presenting on 11/29/2020 for Fever (C/o fever- max 102, chills, body aches, nasal congestion, HA, cough and leg cramps.  Sxs started yesterday.   Wonders if sxs could be caused by gabapentin.  *Pt states today's home COVID test is negative.*)   See prior notes for details.  Last week we started gabapentin for possible neuropathic component to ongoing bilateral flank pain. Has been taking 100mg  nightly for 1 week.   Yesterday afternoon started with fevers/chills, mild cough, mild R earache, as well as sinus congestion/inflammation, ST, leg cramps, HA, body aches, malaise and fatigue. Tmax last night 102.  Head > chest congestion.  No nausea, loss of taste/smell, dyspnea or wheezing.  Tried tylenol as well as nystatin solution for throat. Has been drinking pedialyte, continues vit C, D.   Not COVID vaccinated.      Relevant past medical, surgical, family and social history reviewed  and updated as indicated. Interim medical history since our last visit reviewed. Allergies and medications reviewed and updated. Outpatient Medications Prior to Visit  Medication Sig Dispense Refill   acetaminophen (TYLENOL) 500 MG tablet Take 500 mg by mouth daily as needed for moderate pain or headache (PAIN).      AMBULATORY NON FORMULARY MEDICATION Medication Name:  1 squatty potty to use as needed 1 each 0   Ascorbic Acid (VITAMIN C) 1000 MG tablet Take 1,000 mg by mouth See admin instructions. Take 1000 mg daily, may take an additional 1000 mg as needed for cold symptoms     aspirin EC 81 MG tablet Take 81 mg by mouth at bedtime.      beclomethasone (QVAR REDIHALER) 80 MCG/ACT inhaler Inhale 1 puff into the lungs 2 (two) times daily. 1 each 2   benzonatate (TESSALON) 200 MG capsule Take 1 capsule (200 mg total) by mouth 3 (three) times daily as needed for cough. 30 capsule 6   Biotin 10 MG CAPS Take 1 tablet by mouth daily.     carvedilol (COREG) 6.25 MG tablet TAKE 0.5 TABLETS (3.125 MG TOTAL) BY MOUTH 2 (TWO) TIMES DAILY WITH A MEAL. 90 tablet 0   cholecalciferol (VITAMIN D) 1000 units tablet Take 1,000 Units by mouth daily.      Coenzyme Q10 (COQ-10) 100 MG CAPS Take 100 mg by mouth daily.     dextromethorphan (DELSYM) 30 MG/5ML liquid Take 15 mg by mouth daily as needed for  cough.      ezetimibe (ZETIA) 10 MG tablet Take 1 tablet (10 mg total) by mouth daily. 90 tablet 3   fluorouracil (EFUDEX) 5 % cream Apply topically.     gabapentin (NEURONTIN) 100 MG capsule Take 1 capsule (100 mg total) by mouth 2 (two) times daily. Take one nightly for the first 4 days 60 capsule 3   GARLIC PO Take 1 capsule by mouth daily.     guaiFENesin (MUCINEX) 600 MG 12 hr tablet Take 600 mg by mouth 2 (two) times daily.     levalbuterol (XOPENEX HFA) 45 MCG/ACT inhaler Inhale 2 puffs into the lungs every 6 (six) hours as needed for wheezing or shortness of breath. 15 g 3   levofloxacin (LEVAQUIN) 750 MG  tablet Take 1 tablet (750 mg total) by mouth every other day. 3 tablet 0   LORazepam (ATIVAN) 0.5 MG tablet Take 0.5-1 tablets (0.25-0.5 mg total) by mouth at bedtime as needed for anxiety or sleep. 30 tablet 1   magnesium 30 MG tablet Take 30 mg by mouth daily.     Menthol, Topical Analgesic, (BIOFREEZE ROLL-ON EX) Apply topically.     miconazole (MICOTIN) 2 % powder Apply 1 application topically as needed (yeast).     nystatin (MYCOSTATIN) 100000 UNIT/ML suspension Take 5 mLs (500,000 Units total) by mouth 3 (three) times daily. 120 mL 1   nystatin cream (MYCOSTATIN) Apply 1 application topically as needed for dry skin (yeast infection). 30 g 3   Omega-3 Fatty Acids (FISH OIL) 1000 MG CAPS Take 1,000 capsules by mouth daily.     pantoprazole (PROTONIX) 40 MG tablet Take 1 tablet (40 mg total) by mouth every Monday, Wednesday, and Friday.     potassium chloride (KLOR-CON) 10 MEQ tablet Take 10 mEq by mouth 3 (three) times a week.     pyridOXINE (VITAMIN B-6) 100 MG tablet Take 100 mg by mouth daily.     Red Yeast Rice Extract (RED YEAST RICE PO) Take 1 capsule by mouth in the morning and at bedtime.      sacubitril-valsartan (ENTRESTO) 49-51 MG Take 1 tablet by mouth daily.     spironolactone (ALDACTONE) 25 MG tablet TAKE 1 TABLET BY MOUTH EVERY DAY 90 tablet 1   torsemide (DEMADEX) 10 MG tablet Take 10 mg by mouth 3 (three) times a week.     triamcinolone (NASACORT) 55 MCG/ACT AERO nasal inhaler Place into the nose.     vitamin E 400 UNIT capsule Take 400 Units by mouth daily.     No facility-administered medications prior to visit.     Per HPI unless specifically indicated in ROS section below Review of Systems Objective:  BP 108/64   Pulse (!) 104   Temp 98.4 F (36.9 C)   Ht 5\' 5"  (1.651 m)   Wt 184 lb 7 oz (83.7 kg)   BMI 30.69 kg/m   Wt Readings from Last 3 Encounters:  11/29/20 184 lb 7 oz (83.7 kg)  11/23/20 184 lb (83.5 kg)  10/26/20 184 lb 8 oz (83.7 kg)        Physical exam: Gen: alert, NAD, tired appearing Pulm: speaks in complete sentences without increased work of breathing Psych: normal mood, normal thought content      Assessment & Plan:   Problem List Items Addressed This Visit     Chronic systolic CHF (congestive heart failure) (HCC)   Cardiomyopathy, dilated, 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 In setting of pandemic I do recommend COVID test - she will do Ag test at home and if negative, come in for curbside PCR as early on in course of current illness. Discussed supportive care measures at home.  Doubt gabapetin related however I did recommend she hold at this time until feeling better then may retrial.        COVID-19 virus infection    ADDENDUM ==> patient brought in positive COVID Ag test from home. Have cancelled PCR. Discussed with her over the phone.   Recommend: Renally dose paxlovid in h/o CKD, recent GFR 60 Drug interactions: 1. RYR - hold while on antiviral  2. nasacort - hold while on antiviral  Reviewed currently approved EUA treatments.  Reviewed expected course of illness, anticipated course of recovery, as well as red flags to suggested COVID pneumonia or to seek urgent in-person care. Reviewed latest CDC isolation/quarantining guidelines.  Encouraged fluids and rest. Reviewed further supportive care measures at home including vit C, vit D, zinc, tylenol PRN.         Relevant Medications   nirmatrelvir/ritonavir EUA, renal dosing, (PAXLOVID) TABS     Meds ordered this encounter  Medications   nirmatrelvir/ritonavir EUA, renal dosing, (PAXLOVID) TABS    Sig: Take 2 tablets by mouth 2 (two) times daily for 5 days. (Take nirmatrelvir 150 mg one tablet twice daily for 5 days and ritonavir 100 mg one tablet twice daily for 5 days)  Patient GFR is 60    Dispense:  20 tablet    Refill:  0    No orders of the defined types were placed in this encounter.   I discussed the assessment and treatment plan with the patient. The patient was provided an opportunity to ask questions and all were answered. The patient agreed with the plan and demonstrated an understanding of the instructions. The patient was advised to call back or seek an in-person evaluation if the symptoms worsen or if the condition fails to improve as anticipated.  Follow up plan: No follow-ups on file.  [name], MD

## 2020-11-29 NOTE — Telephone Encounter (Signed)
A user error has taken place: encounter opened in error, closed for administrative reasons.

## 2020-11-30 ENCOUNTER — Encounter: Payer: Self-pay | Admitting: Family Medicine

## 2020-11-30 ENCOUNTER — Telehealth: Payer: Self-pay | Admitting: Cardiovascular Disease

## 2020-11-30 NOTE — Telephone Encounter (Signed)
Called Mrs. Krog to advised pharmD stated  Should not have any drug interactions between paxlovid and other medications- Karren Cobble, [facility]  Mrs. Longmire verbalized understanding and thankful for the return call.

## 2020-11-30 NOTE — Telephone Encounter (Signed)
Should not have any drug interactions between paxlovid and other medications

## 2020-11-30 NOTE — Telephone Encounter (Signed)
Patient calling  States that she has COVID and was prescribed 150 MG Paxlovid 2 tablets twice daily  Wants to know if we should change any heart medications Patient also stopped red yeast rice and nasal court per PCP suggestfox Please call to discuss

## 2020-12-01 ENCOUNTER — Encounter: Payer: Self-pay | Admitting: Family Medicine

## 2020-12-01 ENCOUNTER — Ambulatory Visit: Payer: Medicare Other

## 2020-12-02 ENCOUNTER — Emergency Department: Payer: Medicare Other

## 2020-12-02 ENCOUNTER — Other Ambulatory Visit: Payer: Self-pay | Admitting: Family Medicine

## 2020-12-02 ENCOUNTER — Other Ambulatory Visit: Payer: Self-pay

## 2020-12-02 ENCOUNTER — Telehealth: Payer: Self-pay

## 2020-12-02 ENCOUNTER — Inpatient Hospital Stay
Admission: EM | Admit: 2020-12-02 | Discharge: 2020-12-04 | DRG: 177 | Disposition: A | Discharge status: Home Health | Payer: Medicare Other | Attending: Family Medicine | Admitting: Family Medicine

## 2020-12-02 DIAGNOSIS — I5042 Chronic combined systolic (congestive) and diastolic (congestive) heart failure: Secondary | ICD-10-CM | POA: Diagnosis present

## 2020-12-02 DIAGNOSIS — Z7982 Long term (current) use of aspirin: Secondary | ICD-10-CM | POA: Diagnosis not present

## 2020-12-02 DIAGNOSIS — J9601 Acute respiratory failure with hypoxia: Secondary | ICD-10-CM | POA: Diagnosis present

## 2020-12-02 DIAGNOSIS — Z8262 Family history of osteoporosis: Secondary | ICD-10-CM

## 2020-12-02 DIAGNOSIS — Z79899 Other long term (current) drug therapy: Secondary | ICD-10-CM | POA: Diagnosis not present

## 2020-12-02 DIAGNOSIS — U071 COVID-19: Secondary | ICD-10-CM | POA: Diagnosis present

## 2020-12-02 DIAGNOSIS — E559 Vitamin D deficiency, unspecified: Secondary | ICD-10-CM | POA: Diagnosis present

## 2020-12-02 DIAGNOSIS — Z8249 Family history of ischemic heart disease and other diseases of the circulatory system: Secondary | ICD-10-CM

## 2020-12-02 DIAGNOSIS — I700 Atherosclerosis of aorta: Secondary | ICD-10-CM | POA: Diagnosis present

## 2020-12-02 DIAGNOSIS — N1831 Chronic kidney disease, stage 3a: Secondary | ICD-10-CM | POA: Diagnosis not present

## 2020-12-02 DIAGNOSIS — J9600 Acute respiratory failure, unspecified whether with hypoxia or hypercapnia: Secondary | ICD-10-CM | POA: Diagnosis present

## 2020-12-02 DIAGNOSIS — R0902 Hypoxemia: Secondary | ICD-10-CM

## 2020-12-02 DIAGNOSIS — Z880 Allergy status to penicillin: Secondary | ICD-10-CM

## 2020-12-02 DIAGNOSIS — Z9049 Acquired absence of other specified parts of digestive tract: Secondary | ICD-10-CM

## 2020-12-02 DIAGNOSIS — Z885 Allergy status to narcotic agent status: Secondary | ICD-10-CM

## 2020-12-02 DIAGNOSIS — J449 Chronic obstructive pulmonary disease, unspecified: Secondary | ICD-10-CM | POA: Diagnosis present

## 2020-12-02 DIAGNOSIS — R0689 Other abnormalities of breathing: Secondary | ICD-10-CM | POA: Diagnosis not present

## 2020-12-02 DIAGNOSIS — K219 Gastro-esophageal reflux disease without esophagitis: Secondary | ICD-10-CM | POA: Diagnosis present

## 2020-12-02 DIAGNOSIS — E869 Volume depletion, unspecified: Secondary | ICD-10-CM | POA: Diagnosis present

## 2020-12-02 DIAGNOSIS — Z95810 Presence of automatic (implantable) cardiac defibrillator: Secondary | ICD-10-CM | POA: Diagnosis not present

## 2020-12-02 DIAGNOSIS — N183 Chronic kidney disease, stage 3 unspecified: Secondary | ICD-10-CM | POA: Diagnosis present

## 2020-12-02 DIAGNOSIS — E782 Mixed hyperlipidemia: Secondary | ICD-10-CM | POA: Diagnosis present

## 2020-12-02 DIAGNOSIS — I959 Hypotension, unspecified: Secondary | ICD-10-CM | POA: Diagnosis present

## 2020-12-02 DIAGNOSIS — E669 Obesity, unspecified: Secondary | ICD-10-CM | POA: Diagnosis present

## 2020-12-02 DIAGNOSIS — I495 Sick sinus syndrome: Secondary | ICD-10-CM | POA: Diagnosis present

## 2020-12-02 DIAGNOSIS — M797 Fibromyalgia: Secondary | ICD-10-CM | POA: Diagnosis present

## 2020-12-02 DIAGNOSIS — R531 Weakness: Secondary | ICD-10-CM

## 2020-12-02 DIAGNOSIS — D6959 Other secondary thrombocytopenia: Secondary | ICD-10-CM | POA: Diagnosis present

## 2020-12-02 DIAGNOSIS — Z888 Allergy status to other drugs, medicaments and biological substances status: Secondary | ICD-10-CM

## 2020-12-02 DIAGNOSIS — M810 Age-related osteoporosis without current pathological fracture: Secondary | ICD-10-CM | POA: Diagnosis present

## 2020-12-02 DIAGNOSIS — Z91048 Other nonmedicinal substance allergy status: Secondary | ICD-10-CM

## 2020-12-02 DIAGNOSIS — Z91012 Allergy to eggs: Secondary | ICD-10-CM

## 2020-12-02 DIAGNOSIS — I420 Dilated cardiomyopathy: Secondary | ICD-10-CM | POA: Diagnosis present

## 2020-12-02 DIAGNOSIS — A0839 Other viral enteritis: Secondary | ICD-10-CM | POA: Diagnosis present

## 2020-12-02 DIAGNOSIS — N289 Disorder of kidney and ureter, unspecified: Secondary | ICD-10-CM | POA: Diagnosis present

## 2020-12-02 DIAGNOSIS — Z89422 Acquired absence of other left toe(s): Secondary | ICD-10-CM

## 2020-12-02 DIAGNOSIS — I5022 Chronic systolic (congestive) heart failure: Secondary | ICD-10-CM | POA: Diagnosis present

## 2020-12-02 DIAGNOSIS — Z6830 Body mass index (BMI) 30.0-30.9, adult: Secondary | ICD-10-CM | POA: Diagnosis not present

## 2020-12-02 DIAGNOSIS — Z825 Family history of asthma and other chronic lower respiratory diseases: Secondary | ICD-10-CM

## 2020-12-02 DIAGNOSIS — R7989 Other specified abnormal findings of blood chemistry: Secondary | ICD-10-CM | POA: Diagnosis not present

## 2020-12-02 DIAGNOSIS — F411 Generalized anxiety disorder: Secondary | ICD-10-CM | POA: Diagnosis present

## 2020-12-02 DIAGNOSIS — R55 Syncope and collapse: Secondary | ICD-10-CM | POA: Diagnosis present

## 2020-12-02 DIAGNOSIS — J9811 Atelectasis: Secondary | ICD-10-CM | POA: Diagnosis not present

## 2020-12-02 DIAGNOSIS — Z887 Allergy status to serum and vaccine status: Secondary | ICD-10-CM

## 2020-12-02 DIAGNOSIS — Z28310 Unvaccinated for covid-19: Secondary | ICD-10-CM

## 2020-12-02 DIAGNOSIS — R197 Diarrhea, unspecified: Secondary | ICD-10-CM | POA: Diagnosis not present

## 2020-12-02 DIAGNOSIS — Z882 Allergy status to sulfonamides status: Secondary | ICD-10-CM

## 2020-12-02 DIAGNOSIS — R0602 Shortness of breath: Secondary | ICD-10-CM | POA: Diagnosis not present

## 2020-12-02 DIAGNOSIS — Z90710 Acquired absence of both cervix and uterus: Secondary | ICD-10-CM

## 2020-12-02 LAB — BRAIN NATRIURETIC PEPTIDE: B Natriuretic Peptide: 15.1 pg/mL (ref 0.0–100.0)

## 2020-12-02 LAB — CBC WITH DIFFERENTIAL/PLATELET
Abs Immature Granulocytes: 0.05 10*3/uL (ref 0.00–0.07)
Basophils Absolute: 0 10*3/uL (ref 0.0–0.1)
Basophils Relative: 0 %
Eosinophils Absolute: 0.1 10*3/uL (ref 0.0–0.5)
Eosinophils Relative: 1 %
HCT: 41 % (ref 36.0–46.0)
Hemoglobin: 13.2 g/dL (ref 12.0–15.0)
Immature Granulocytes: 1 %
Lymphocytes Relative: 28 %
Lymphs Abs: 1.5 10*3/uL (ref 0.7–4.0)
MCH: 29.9 pg (ref 26.0–34.0)
MCHC: 32.2 g/dL (ref 30.0–36.0)
MCV: 92.8 fL (ref 80.0–100.0)
Monocytes Absolute: 0.5 10*3/uL (ref 0.1–1.0)
Monocytes Relative: 10 %
Neutro Abs: 3.1 10*3/uL (ref 1.7–7.7)
Neutrophils Relative %: 60 %
Platelets: 150 10*3/uL (ref 150–400)
RBC: 4.42 MIL/uL (ref 3.87–5.11)
RDW: 12.8 % (ref 11.5–15.5)
WBC: 5.2 10*3/uL (ref 4.0–10.5)
nRBC: 0 % (ref 0.0–0.2)

## 2020-12-02 LAB — FERRITIN: Ferritin: 245 ng/mL (ref 11–307)

## 2020-12-02 LAB — TYPE AND SCREEN
ABO/RH(D): O POS
Antibody Screen: NEGATIVE

## 2020-12-02 LAB — HEPATITIS B SURFACE ANTIGEN: Hepatitis B Surface Ag: NONREACTIVE

## 2020-12-02 LAB — COMPREHENSIVE METABOLIC PANEL
ALT: 41 U/L (ref 0–44)
AST: 41 U/L (ref 15–41)
Albumin: 4 g/dL (ref 3.5–5.0)
Alkaline Phosphatase: 84 U/L (ref 38–126)
Anion gap: 10 (ref 5–15)
BUN: 15 mg/dL (ref 8–23)
CO2: 20 mmol/L — ABNORMAL LOW (ref 22–32)
Calcium: 9.3 mg/dL (ref 8.9–10.3)
Chloride: 104 mmol/L (ref 98–111)
Creatinine, Ser: 1.07 mg/dL — ABNORMAL HIGH (ref 0.44–1.00)
GFR, Estimated: 54 mL/min — ABNORMAL LOW (ref >60)
Glucose, Bld: 196 mg/dL — ABNORMAL HIGH (ref 70–99)
Potassium: 3.5 mmol/L (ref 3.5–5.1)
Sodium: 134 mmol/L — ABNORMAL LOW (ref 135–145)
Total Bilirubin: 0.6 mg/dL (ref 0.3–1.2)
Total Protein: 7.6 g/dL (ref 6.5–8.1)

## 2020-12-02 LAB — C-REACTIVE PROTEIN: CRP: 0.6 mg/dL (ref <1.0)

## 2020-12-02 LAB — POC SARS CORONAVIRUS 2 AG -  ED: SARS Coronavirus 2 Ag: POSITIVE — AB

## 2020-12-02 LAB — FIBRINOGEN: Fibrinogen: 433 mg/dL (ref 210–475)

## 2020-12-02 LAB — LACTIC ACID, PLASMA
Lactic Acid, Venous: 1.8 mmol/L (ref 0.5–1.9)
Lactic Acid, Venous: 1.6 mmol/L (ref 0.5–1.9)

## 2020-12-02 LAB — LACTATE DEHYDROGENASE: LDH: 153 U/L (ref 98–192)

## 2020-12-02 LAB — TROPONIN I (HIGH SENSITIVITY): Troponin I (High Sensitivity): 8 ng/L (ref <18)

## 2020-12-02 LAB — D-DIMER, QUANTITATIVE: D-Dimer, Quant: 2.53 ug/mL-FEU — ABNORMAL HIGH (ref 0.00–0.50)

## 2020-12-02 LAB — PROCALCITONIN: Procalcitonin: <0.1 ng/mL

## 2020-12-02 MED ORDER — METHYLPREDNISOLONE SODIUM SUCC 125 MG IJ SOLR
1.0000 mg/kg | Freq: Two times a day (BID) | INTRAMUSCULAR | Status: DC
Start: 2020-12-02 — End: 2020-12-03
  Administered 2020-12-02: 78.75 mg via INTRAVENOUS
  Filled 2020-12-02 (×2): qty 2

## 2020-12-02 MED ORDER — SODIUM CHLORIDE 0.9 % IV SOLN: 100.0000 mg | Freq: Every day | INTRAVENOUS | Status: DC

## 2020-12-02 MED ORDER — ACETAMINOPHEN 500 MG PO TABS: 500.0000 mg | ORAL_TABLET | Freq: Every day | ORAL | Status: DC | PRN

## 2020-12-02 MED ORDER — LEVALBUTEROL TARTRATE 45 MCG/ACT IN AERO
2.0000 | INHALATION_SPRAY | Freq: Four times a day (QID) | RESPIRATORY_TRACT | Status: DC | PRN
  Filled 2020-12-02: qty 15

## 2020-12-02 MED ORDER — FLUTICASONE PROPIONATE HFA 44 MCG/ACT IN AERO
2.0000 | INHALATION_SPRAY | Freq: Two times a day (BID) | RESPIRATORY_TRACT | Status: DC
  Administered 2020-12-03 – 2020-12-04 (×2): 2 via RESPIRATORY_TRACT
  Filled 2020-12-02 (×2): qty 10.6

## 2020-12-02 MED ORDER — PREDNISONE 50 MG PO TABS: 50.0000 mg | ORAL_TABLET | Freq: Every day | ORAL | Status: DC

## 2020-12-02 MED ORDER — MAGNESIUM CHLORIDE 64 MG PO TBEC
64.0000 mg | DELAYED_RELEASE_TABLET | Freq: Every day | ORAL | Status: DC
  Administered 2020-12-02 – 2020-12-04 (×3): 64 mg via ORAL
  Filled 2020-12-02 (×4): qty 1

## 2020-12-02 MED ORDER — SODIUM CHLORIDE 0.9 % IV SOLN: 250.0000 mL | INTRAVENOUS | Status: DC | PRN

## 2020-12-02 MED ORDER — ENOXAPARIN SODIUM 40 MG/0.4ML IJ SOSY
40.0000 mg | PREFILLED_SYRINGE | INTRAMUSCULAR | Status: DC
  Filled 2020-12-02 (×2): qty 0.4

## 2020-12-02 MED ORDER — BENZONATATE 100 MG PO CAPS: 200.0000 mg | ORAL_CAPSULE | Freq: Three times a day (TID) | ORAL | Status: DC

## 2020-12-02 MED ORDER — ONDANSETRON HCL 4 MG PO TABS: 4.0000 mg | ORAL_TABLET | Freq: Four times a day (QID) | ORAL | Status: DC | PRN

## 2020-12-02 MED ORDER — DEXAMETHASONE SODIUM PHOSPHATE 10 MG/ML IJ SOLN: 6.0000 mg | INTRAMUSCULAR | Status: DC

## 2020-12-02 MED ORDER — SODIUM CHLORIDE 0.9 % IV BOLUS
500.0000 mL | Freq: Once | INTRAVENOUS | Status: AC
  Administered 2020-12-02: 500 mL via INTRAVENOUS

## 2020-12-02 MED ORDER — BENZONATATE 100 MG PO CAPS
200.0000 mg | ORAL_CAPSULE | Freq: Three times a day (TID) | ORAL | Status: DC | PRN
  Administered 2020-12-02 – 2020-12-03 (×3): 100 mg via ORAL
  Administered 2020-12-04: 200 mg via ORAL
  Filled 2020-12-02 (×4): qty 2

## 2020-12-02 MED ORDER — VITAMIN D 25 MCG (1000 UNIT) PO TABS
1000.0000 [IU] | ORAL_TABLET | Freq: Every day | ORAL | Status: DC
  Administered 2020-12-02 – 2020-12-04 (×3): 1000 [IU] via ORAL
  Filled 2020-12-02 (×3): qty 1

## 2020-12-02 MED ORDER — OMEGA-3-ACID ETHYL ESTERS 1 G PO CAPS
1.0000 g | ORAL_CAPSULE | Freq: Every day | ORAL | Status: DC
  Administered 2020-12-02 – 2020-12-04 (×3): 1 g via ORAL
  Filled 2020-12-02 (×4): qty 1

## 2020-12-02 MED ORDER — VITAMIN B-6 50 MG PO TABS
100.0000 mg | ORAL_TABLET | Freq: Every day | ORAL | Status: DC
  Administered 2020-12-02 – 2020-12-04 (×3): 100 mg via ORAL
  Filled 2020-12-02 (×3): qty 2

## 2020-12-02 MED ORDER — ONDANSETRON HCL 4 MG/2ML IJ SOLN: 4.0000 mg | Freq: Four times a day (QID) | INTRAMUSCULAR | Status: DC | PRN

## 2020-12-02 MED ORDER — SODIUM CHLORIDE 0.9% FLUSH: 3.0000 mL | INTRAVENOUS | Status: DC | PRN

## 2020-12-02 MED ORDER — SODIUM CHLORIDE 0.9% FLUSH
3.0000 mL | Freq: Two times a day (BID) | INTRAVENOUS | Status: DC
  Administered 2020-12-02 – 2020-12-04 (×5): 3 mL via INTRAVENOUS

## 2020-12-02 MED ORDER — NIRMATRELVIR/RITONAVIR (PAXLOVID) TABLET (RENAL DOSING)
2.0000 | ORAL_TABLET | Freq: Two times a day (BID) | ORAL | Status: DC
  Administered 2020-12-02 – 2020-12-04 (×5): 2 via ORAL
  Filled 2020-12-02 (×4): qty 20

## 2020-12-02 MED ORDER — EZETIMIBE 10 MG PO TABS
10.0000 mg | ORAL_TABLET | Freq: Every day | ORAL | Status: DC
  Administered 2020-12-02 – 2020-12-04 (×3): 10 mg via ORAL
  Filled 2020-12-02 (×4): qty 1

## 2020-12-02 MED ORDER — VITAMIN E 45 MG (100 UNIT) PO CAPS
400.0000 [IU] | ORAL_CAPSULE | Freq: Every day | ORAL | Status: DC
  Administered 2020-12-02 – 2020-12-04 (×3): 400 [IU] via ORAL
  Filled 2020-12-02 (×3): qty 4

## 2020-12-02 MED ORDER — PANTOPRAZOLE SODIUM 40 MG PO TBEC
40.0000 mg | DELAYED_RELEASE_TABLET | ORAL | Status: DC
  Filled 2020-12-02: qty 1

## 2020-12-02 MED ORDER — BIOTIN 10 MG PO CAPS: 1.0000 | ORAL_CAPSULE | Freq: Every day | ORAL | Status: DC

## 2020-12-02 MED ORDER — GABAPENTIN 100 MG PO CAPS
100.0000 mg | ORAL_CAPSULE | Freq: Two times a day (BID) | ORAL | Status: DC
  Filled 2020-12-02 (×4): qty 1

## 2020-12-02 MED ORDER — SODIUM CHLORIDE 0.9 % IV SOLN
200.0000 mg | Freq: Once | INTRAVENOUS | Status: DC
  Filled 2020-12-02: qty 40

## 2020-12-02 MED ORDER — BECLOMETHASONE DIPROP HFA 80 MCG/ACT IN AERB: 1.0000 | INHALATION_SPRAY | Freq: Two times a day (BID) | RESPIRATORY_TRACT | Status: DC

## 2020-12-02 MED ORDER — ASCORBIC ACID 500 MG PO TABS
1000.0000 mg | ORAL_TABLET | Freq: Every day | ORAL | Status: DC
  Administered 2020-12-02 – 2020-12-04 (×3): 1000 mg via ORAL
  Filled 2020-12-02 (×3): qty 2

## 2020-12-02 MED ORDER — IOHEXOL 350 MG/ML SOLN
75.0000 mL | Freq: Once | INTRAVENOUS | Status: AC | PRN
  Administered 2020-12-02: 75 mL via INTRAVENOUS

## 2020-12-02 MED ORDER — LORAZEPAM 0.5 MG PO TABS
0.2500 mg | ORAL_TABLET | Freq: Every evening | ORAL | Status: DC | PRN
Start: 2020-12-02 — End: 2020-12-04

## 2020-12-02 MED ORDER — ASPIRIN EC 81 MG PO TBEC
81.0000 mg | DELAYED_RELEASE_TABLET | Freq: Every day | ORAL | Status: DC
  Administered 2020-12-02 – 2020-12-03 (×2): 81 mg via ORAL
  Filled 2020-12-02 (×2): qty 1

## 2020-12-02 MED ORDER — NYSTATIN 100000 UNIT/ML MT SUSP
5.0000 mL | Freq: Three times a day (TID) | OROMUCOSAL | Status: DC
  Administered 2020-12-02 – 2020-12-04 (×6): 500000 [IU] via ORAL
  Filled 2020-12-02 (×6): qty 5

## 2020-12-02 NOTE — H&P (Signed)
History and Physical    Ashley [name] WLN:[id number] DOB: 07-Mar-1946 DOA: 12/02/2020  PCP: [name], MD   Patient coming from: Home  I have personally briefly reviewed patient's old medical records in [place]  Chief Complaint: Weakness/near syncope  HPI: Ashley [name] is a 75 y.o. female with medical history significant for COPD, GERD, status post AICD insertion, chronic diastolic dysfunction CHF who presents to the emergency room by EMS for evaluation of weakness and near syncopal episodes. Patient states that she started having symptoms of diarrhea, feeling unwell, weakness on 7/17 and tested positive for the COVID-19 virus on 718.  She is unvaccinated and was started on Paxlovid by her primary care provider. She states that she has continued to have loose watery stools over the last couple of days associated with nausea, headache, cough, shortness of breath chills, body ache and congestion and on the day prior to her admission she had multiple episodes of loose stools and after each one she felt like she was going to blackout and had to lay down for period of time.  She notes that her blood pressure was as low as 74 mmHg. She called EMS who brought him to the ER for further evaluation. She denies having any abdominal pain, no chest pain, no urinary frequency, no fever, no dysuria, no palpitations, no diaphoresis, no leg swelling. Labs show sodium 134, potassium 3.5, chloride 104, bicarb 20, glucose 196, BUN 15, creatinine 1.07, calcium 9.3, alkaline phosphatase 84, albumin 4.0, AST 41, ALT 41, total protein 7.6, LDH 153, ferritin 245, lactic acid 1.6, procalcitonin less than 0.10, white count 5.2, hemoglobin 13.2, hematocrit 41, MCV 92.8, RDW 12.8, platelet count 150, D-dimer 2.53, fibrinogen 433 Chest x-ray reviewed by me shows no evidence of acute cardiopulmonary disease. CT scan of the head without contrast is normal with no acute intracranial abnormality. CT  angiogram of the chest is negative for pulmonary embolism. Twelve-lead EKG reviewed by me shows sinus rhythm.   ED Course: Patient is a 75 year old Caucasian female who presents to the ER for evaluation of weakness and near syncopal episodes and tested positive for the COVID-19 virus on 07/18.  She was started on Paxlovid on 07/19 by her primary care provider and she wishes to continue this medication during this hospitalization. Post ambulatory pulse oximetry was 86% on room air and patient was placed on 2 L of oxygen. She was also hypotensive with systolic blood pressure of 18mmHg which improved with IV fluid resuscitation. She will be admitted to the hospital for further evaluation.   Review of Systems: As per HPI otherwise all other systems reviewed and negative.    Past Medical History:  Diagnosis Date   Abdominal aortic atherosclerosis ([place]) 11/2015   by CT   Allergy    Anemia    Anxiety    Automatic implantable cardioverter-defibrillator in situ 2012   a. MDT single lead ICD.   Bronchiectasis    a. possibly mild, treat URIs aggressively   Cancer ([place])    basal cell, arms & neck   CHF (congestive heart failure) (HCC)    Chronic sinusitis    COPD (chronic obstructive pulmonary disease) (HCC)    Depression    Dyspnea    Essential hypertension    Pt denies   Family history of adverse reaction to anesthesia    Mother - PONV   Fibrocystic breast disease    Fibromyalgia    GERD (gastroesophageal reflux disease)    H/O multiple  allergies 10/10/2013   Hemorrhoids    History of diverticulitis of colon    History of pneumonia 2012   Insomnia    Irritable bowel syndrome    Laryngeal nodule    Dr. Thomasena Edis   Migraine without aura, without mention of intractable migraine without mention of status migrainosus    Mitral regurgitation    Nonischemic cardiomyopathy ([place])    a. Recovered LV fxn - prev as low as 25%, now 50-55% by echo 04/2014;  b. 2012 s/p MDT [id number] [place] VR  single lead ICD.   Osteoarthritis    knees, back, hands, low back,    Osteoporosis 01/2013   T-2.7 spine (01/2016)   PONV (postoperative nausea and vomiting)    sigmoid diverticulitis with perforation, abscess and fistula 09/30/2013   Sleep apnea 2010   borderline, inconclusive- /w [place]     Past Surgical History:  Procedure Laterality Date   ABDOMINAL HYSTERECTOMY  1987   w/BSO   ANAL RECTAL MANOMETRY N/A 02/02/2016   Procedure: ANO RECTAL MANOMETRY;  Surgeon: Mauri Pole, MD;  Location: [facility];  Service: Endoscopy;  Laterality: N/A;   APPENDECTOMY     AUGMENTATION MAMMAPLASTY     BIV UPGRADE N/A 11/04/2018   Procedure: BIV ICD  UPGRADE;  Surgeon: Evans Lance, MD;  Location: [facility];  Service: Cardiovascular;  Laterality: N/A;   breast cystectomies     due to [place]  01/2010   Left-fibrocystic change w/intraductal papilloma, no malignancy   carotid US  02/6268   4-85% LICA, normal RICA   [place] Bunionectomy  11/04/08   Right Great Toe (Dr. Beola Cord)   COLONOSCOPY  01/2018   1 small benign polyp, diverticulosis, healthy anastomosis [name] @ [facility])   COLOSTOMY N/A 10/02/2013   DESCENDING COLOSTOMY;  Surgeon: Imogene Burn. Clyde, MD   COLOSTOMY REVERSAL N/A 02/10/2014   [name], MD   EMG/MCV  10/16/01   + mild carpal tunnel   ESOPHAGOGASTRODUODENOSCOPY  09/2014   small HH, irregular Z line, fundic gland polyps with mild gastritis Olevia Perches)   Flex laryngoscopy  06/11/06   ([place]) nml   IMPLANTABLE CARDIOVERTER DEFIBRILLATOR IMPLANT N/A 07/14/2011   MDT single chamber ICD   KNEE ARTHROSCOPY W/ ORIF     Left; "meniscus tear"   LASIK     left eye   MANDIBLE SURGERY     "got about 6 pins in the bottom of my jaw"   NCS  11/2014   L ulnar neuropathy, mild B median nerve entrapment ([place])   OSTEOTOMY     Left foot   PARTIAL COLECTOMY N/A 10/02/2013   PARTIAL COLECTOMY;  Surgeon: Imogene Burn. Tsuei, MD   RIGHT/LEFT HEART CATH AND  CORONARY ANGIOGRAPHY N/A 10/16/2018   Procedure: RIGHT/LEFT HEART CATH AND CORONARY ANGIOGRAPHY;  Surgeon: Wellington Hampshire, MD;  Location: [facility];  Service: Cardiovascular;  Laterality: N/A;   sinus surgery     x3 with balloon    TOE AMPUTATION     "toe beside baby toe on left foot; got gangrene from corn"   [place]       reports that she has never smoked. She has never used smokeless tobacco. She reports that she does not drink alcohol and does not use drugs.  Allergies  Allergen Reactions   Atorvastatin Other (See Comments)    Severe muscle cramps to generic atorvastatin.  Able to take brand lipitor.   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    Influenza Vac Split [Influenza Virus Vaccine] Other (See Comments)    "allergic to concentrated eggs that are in vaccine"   Latex Nausea Only, Rash and Other (See Comments)    Pain and infection   Morphine And Related Other (See Comments)    Internally feels like she is on fire    Pravastatin Other (See Comments)    myalgias   Prevacid [Lansoprazole] Other (See Comments)    Worsened GI side effects   Prevnar [Pneumococcal 13-Val Conj Vacc] Other (See Comments)    Egg allergy Bad reaction after shot   Rosuvastatin Other (See Comments)    Was not effective controlling lipids   Simvastatin Other (See Comments)    Leg cramps   Tape Other (See Comments) and Rash    All  adhesives  - Blisters, itching and burning  blistering   Metaxalone    Tramadol     Leg cramps   Antihistamines, Chlorpheniramine-Type Other (See Comments)    Alters vision   Betadine [Povidone Iodine] Rash    Per pt.    Onion Diarrhea and Other (See Comments)    GI distress, flared IBS   Other Hives and Rash    NOTE: pt is able to take cephalosporins without reaction   Penicillins Other (See Comments)    CHILDHOOD ALLEGY/REACTION: "told 50 years ago I couldn't take it; might be immune to it", able to take amoxicillin Has patient had a PCN reaction causing immediate rash, facial/tongue/throat swelling, SOB or lightheadedness with hypotension: Unknown Has patient had a PCN reaction causing severe rash involving mucus membranes or skin necrosis: UnknowN Has patient had a PCN reaction that required hospitalization: /Unknown Has patient had a PCN reaction occurring within the last 10 years: Unknown If a   [place] Nausea Only and Swelling   Sertraline Other (See Comments)    Overly sedating   Sulfasalazine Other (See Comments)    "makes my whole body smell like sulfa & makes me sick just smelling it"    Family History  Problem Relation Age of Onset   Lung cancer Father        + smoker   Colon polyps Father    Dementia Mother    Osteoporosis Mother        Lumbar spine   Stroke Mother        x 5 @ 40 YOA   Arthritis Mother        hands   Emphysema Mother    Alcohol abuse Paternal Grandfather    Stroke Paternal Grandfather        ?   Heart disease Paternal Grandfather    Hypertension Maternal Grandfather        ?   Heart disease Paternal Grandmother    Colon cancer Neg Hx    Esophageal cancer Neg Hx  Gallbladder disease Neg Hx       Prior to Admission medications   Medication Sig Start Date End Date Taking? Authorizing Provider  acetaminophen (TYLENOL) 500 MG tablet Take 500 mg by mouth daily as needed for moderate pain or headache (PAIN).    Yes [provider]  AMBULATORY NON FORMULARY MEDICATION Medication Name:  1 squatty potty to use as needed 01/20/16  Yes Nandigam, Venia Minks, MD  Ascorbic Acid (VITAMIN C) 1000 MG tablet Take 1,000 mg by mouth See admin instructions. Take 1000 mg daily, may take an additional 1000 mg as needed for cold symptoms   Yes [provider]  aspirin EC 81 MG tablet Take 81 mg by mouth at bedtime.    Yes [provider]  beclomethasone (QVAR REDIHALER) 80 MCG/ACT inhaler Inhale 1 puff into the lungs 2 (two) times daily. 08/25/20 08/25/21 Yes Tyler Pita, MD  benzonatate (TESSALON) 200 MG capsule Take 1 capsule (200 mg total) by mouth 3 (three) times daily as needed for cough. 06/08/20  Yes [name], MD  Biotin 10 MG CAPS Take 1 tablet by mouth daily.   Yes [provider]  carvedilol (COREG) 6.25 MG tablet TAKE 0.5 TABLETS (3.125 MG TOTAL) BY MOUTH 2 (TWO) TIMES DAILY WITH A MEAL. 10/29/20  Yes Minna Merritts, MD  cholecalciferol (VITAMIN D) 1000 units tablet Take 1,000 Units by mouth daily.    Yes [provider]  Coenzyme Q10 (COQ-10) 100 MG CAPS Take 100 mg by mouth daily.   Yes [provider]  dextromethorphan (DELSYM) 30 MG/5ML liquid Take 15 mg by mouth daily as needed for cough.    Yes [provider]  ezetimibe (ZETIA) 10 MG tablet Take 1 tablet (10 mg total) by mouth daily. 01/06/20  Yes Minna Merritts, MD  fluorouracil (EFUDEX) 5 % cream Apply topically. 06/05/20  Yes [provider]  gabapentin (NEURONTIN) 100 MG capsule Take 1 capsule (100 mg total) by mouth 2 (two) times daily. Take one nightly for the first 4 days 11/23/20  Yes [name], MD  GARLIC PO Take 1 capsule by mouth daily.   Yes [provider]  guaiFENesin (MUCINEX) 600 MG 12 hr tablet Take 600 mg by mouth 2 (two) times daily.   Yes [provider]  levalbuterol (XOPENEX HFA) 45 MCG/ACT inhaler Inhale 2 puffs into the lungs every 6 (six)  hours as needed for wheezing or shortness of breath. 08/25/20  Yes Tyler Pita, MD  levofloxacin (LEVAQUIN) 750 MG tablet Take 1 tablet (750 mg total) by mouth every other day. 10/15/20  Yes [name], MD  LORazepam (ATIVAN) 0.5 MG tablet Take 0.5-1 tablets (0.25-0.5 mg total) by mouth at bedtime as needed for anxiety or sleep. 03/22/20  Yes [name], MD  magnesium 30 MG tablet Take 30 mg by mouth daily.   Yes [provider]  Menthol, Topical Analgesic, (BIOFREEZE ROLL-ON EX) Apply topically.   Yes [provider]  miconazole (MICOTIN) 2 % powder Apply 1 application topically as needed (yeast).   Yes [provider]  nirmatrelvir/ritonavir EUA, renal dosing, (PAXLOVID) TABS Take 2 tablets by mouth 2 (two) times daily for 5 days. (Take nirmatrelvir 150 mg one tablet twice daily for 5 days and ritonavir 100 mg one tablet twice daily for 5 days) Patient GFR is 60 11/29/20 12/04/20 Yes [name], MD  nystatin (MYCOSTATIN) 100000 UNIT/ML suspension Take 5 mLs (500,000 Units total) by mouth 3 (three) times  daily. 03/22/20  Yes [name], MD  nystatin cream (MYCOSTATIN) Apply 1 application topically as needed for dry skin (yeast infection). 09/11/19  Yes [name], MD  Omega-3 Fatty Acids (FISH OIL) 1000 MG CAPS Take 1,000 capsules by mouth daily.   Yes [provider]  pantoprazole (PROTONIX) 40 MG tablet Take 1 tablet (40 mg total) by mouth every Monday, Wednesday, and Friday. 08/11/20  Yes [name], MD  potassium chloride (KLOR-CON) 10 MEQ tablet Take 10 mEq by mouth 3 (three) times a week.   Yes [provider]  pyridOXINE (VITAMIN B-6) 100 MG tablet Take 100 mg by mouth daily.   Yes [provider]  sacubitril-valsartan (ENTRESTO) 49-51 MG Take 1 tablet by mouth daily.   Yes [provider]  spironolactone (ALDACTONE) 25 MG tablet TAKE 1 TABLET BY MOUTH EVERY DAY 10/29/20  Yes Gollan, Kathlene November,  MD  torsemide (DEMADEX) 10 MG tablet Take 10 mg by mouth 3 (three) times a week.   Yes [provider]  vitamin E 400 UNIT capsule Take 400 Units by mouth daily.   Yes [provider]  Red Yeast Rice Extract (RED YEAST RICE PO) Take 1 capsule by mouth in the morning and at bedtime.  Patient not taking: Reported on 12/02/2020    [provider]  triamcinolone (NASACORT) 55 MCG/ACT AERO nasal inhaler Place into the nose. Patient not taking: Reported on 12/02/2020    [provider]  bisoprolol (ZEBETA) 5 MG tablet Take 5 mg by mouth daily. 06/06/11 07/02/18  Minna Merritts, MD    Physical Exam: Vitals:   12/02/20 0609 12/02/20 0611 12/02/20 0630 12/02/20 0700  BP: (!) 123/57  128/66 117/61  Pulse: 71  71 76  Resp: 18  18 16   Temp:      TempSrc:      SpO2: 98% (!) 86% 98% 96%  Weight:      Height:         Vitals:   12/02/20 0609 12/02/20 0611 12/02/20 0630 12/02/20 0700  BP: (!) 123/57  128/66 117/61  Pulse: 71  71 76  Resp: 18  18 16   Temp:      TempSrc:      SpO2: 98% (!) 86% 98% 96%  Weight:      Height:          Constitutional: Alert and oriented x 3 . Not in any apparent distress HEENT:      Head: Normocephalic and atraumatic.         Eyes: PERLA, EOMI, Conjunctivae are normal. Sclera is non-icteric.       Mouth/Throat: Mucous membranes are moist.       Neck: Supple with no signs of meningismus. Cardiovascular: Regular rate and rhythm. No murmurs, gallops, or rubs. 2+ symmetrical distal pulses are present . No JVD. No LE edema Respiratory: Respiratory effort normal .bilateral air entry in both lung fields. No wheezes, crackles, or rhonchi.  Gastrointestinal: Soft, non tender, and non distended with positive bowel sounds.  Genitourinary: No CVA tenderness. Musculoskeletal: Nontender with normal range of motion in all extremities. No cyanosis, or erythema of extremities. Neurologic:  Face is symmetric. Moving all extremities. No  gross focal neurologic deficits . Skin: Skin is warm, dry.  No rash or ulcers Psychiatric: Mood and affect are normal    Labs on Admission: I have personally reviewed following labs and imaging studies  CBC: Recent Labs  Lab 12/02/20 0229  WBC 5.2  NEUTROABS 3.1  HGB 13.2  HCT 41.0  MCV 92.8  PLT 048   Basic Metabolic Panel: Recent Labs  Lab 12/02/20 0229  NA 134*  K 3.5  CL 104  CO2 20*  GLUCOSE 196*  BUN 15  CREATININE 1.07*  CALCIUM 9.3   GFR: Estimated Creatinine Clearance: 47.2 mL/min (A) (by C-G formula based on SCr of 1.07 mg/dL (H)). Liver Function Tests: Recent Labs  Lab 12/02/20 0229  AST 41  ALT 41  ALKPHOS 84  BILITOT 0.6  PROT 7.6  ALBUMIN 4.0   No results for input(s): LIPASE, AMYLASE in the last 168 hours. No results for input(s): AMMONIA in the last 168 hours. Coagulation Profile: No results for input(s): INR, PROTIME in the last 168 hours. Cardiac Enzymes: No results for input(s): CKTOTAL, CKMB, CKMBINDEX, TROPONINI in the last 168 hours. BNP (last 3 results) No results for input(s): PROBNP in the last 8760 hours. HbA1C: No results for input(s): HGBA1C in the last 72 hours. CBG: No results for input(s): GLUCAP in the last 168 hours. Lipid Profile: No results for input(s): CHOL, HDL, LDLCALC, TRIG, CHOLHDL, LDLDIRECT in the last 72 hours. Thyroid Function Tests: No results for input(s): TSH, T4TOTAL, FREET4, T3FREE, THYROIDAB in the last 72 hours. Anemia Panel: Recent Labs    12/02/20 0338  FERRITIN 245   Urine analysis:    Component Value Date/Time   COLORURINE YELLOW 09/01/2019 0124   APPEARANCEUR HAZY (A) 09/01/2019 0124   LABSPEC 1.020 09/01/2019 0124   PHURINE 5.0 09/01/2019 0124   GLUCOSEU NEGATIVE 09/01/2019 0124   HGBUR NEGATIVE 09/01/2019 0124   BILIRUBINUR negative 10/26/2020 1646   KETONESUR NEGATIVE 09/01/2019 0124   PROTEINUR Negative 10/26/2020 1646   PROTEINUR NEGATIVE 09/01/2019 0124   UROBILINOGEN 0.2  10/26/2020 1646   UROBILINOGEN 0.2 05/27/2014 1952   NITRITE negative 10/26/2020 1646   NITRITE NEGATIVE 09/01/2019 0124   LEUKOCYTESUR Negative 10/26/2020 1646   LEUKOCYTESUR MODERATE (A) 09/01/2019 0124    Radiological Exams on Admission: CT Head Wo Contrast  Result Date: 12/02/2020 CLINICAL DATA:  Syncope. EXAM: CT HEAD WITHOUT CONTRAST TECHNIQUE: Contiguous axial images were obtained from the base of the skull through the vertex without intravenous contrast. COMPARISON:  09/29/2016 FINDINGS: Brain: There is no evidence for acute hemorrhage, hydrocephalus, mass lesion, or abnormal extra-axial fluid collection. No definite CT evidence for acute infarction. Vascular: No hyperdense vessel or unexpected calcification. Skull: No evidence for fracture. No worrisome lytic or sclerotic lesion. Sinuses/Orbits: The visualized paranasal sinuses and mastoid air cells are clear. Visualized portions of the globes and intraorbital fat are unremarkable. Other: None. IMPRESSION: Stable.  No acute intracranial abnormality. Electronically Signed   By: Misty Stanley M.D.   On: 12/02/2020 06:12   CT Angio Chest PE W/Cm &/Or Wo Cm  Result Date: 12/02/2020 CLINICAL DATA:  Syncope at home.  COVID positive.  Positive D-dimer. EXAM: CT ANGIOGRAPHY CHEST WITH CONTRAST TECHNIQUE: Multidetector CT imaging of the chest was performed using the standard protocol during bolus administration of intravenous contrast. Multiplanar CT image reconstructions and MIPs were obtained to evaluate the vascular anatomy. CONTRAST:  9mL OMNIPAQUE IOHEXOL 350 MG/ML SOLN COMPARISON:  04/28/2011 FINDINGS: Cardiovascular: Dual-chamber pacer leads into the right heart. Prominent left ventricular size. No generalized cardiomegaly or pericardial effusion. Satisfactory opacification of the pulmonary arteries with no filling defect seen. Mediastinum/Nodes: Negative for adenopathy or mass Lungs/Pleura: Bronchomalacia with intermittent airway collapse.  Only minor atelectasis however. Trace pleural fluid on the right. No consolidation or pneumothorax. Upper Abdomen: No  acute finding Musculoskeletal: No acute finding. Remote and healed posterior right rib fractures. Review of the MIP images confirms the above findings. IMPRESSION: Negative for pulmonary embolism or other acute finding. Aortic Atherosclerosis (ICD10-I70.0). Electronically Signed   By: Monte Fantasia M.D.   On: 12/02/2020 06:20   DG Chest Port 1 View  Result Date: 12/02/2020 CLINICAL DATA:  Shortness of breath EXAM: PORTABLE CHEST 1 VIEW COMPARISON:  12/10/2018 FINDINGS: Lungs are clear.  No pleural effusion or pneumothorax. The heart is normal in size.  Left subclavian ICD. IMPRESSION: No evidence of acute cardiopulmonary disease. Electronically Signed   By: Julian Hy M.D.   On: 12/02/2020 03:09     Assessment/Plan Principal Problem:   Near syncope Active Problems:   Chronic systolic CHF (congestive heart failure) (HCC)   CKD (chronic kidney disease) stage 3, GFR 30-59 ml/min (HCC)   GERD (gastroesophageal reflux disease)   Gastroenteritis due to COVID-19 virus   Acute respiratory failure (HCC)   Acute respiratory failure due to COVID-19 [facility])      Near syncope Secondary to volume depletion from GI losses related to viral gastroenteritis Patient was hypotensive upon arrival to the ER and responded to IV fluid resuscitation Hold carvedilol, torsemide, spironolactone and Entresto for now     Gastroenteritis due to COVID-19 virus Continue supportive care IV fluid resuscitation, antiemetics    Acute respiratory failure secondary to COVID-19 virus Patient is unvaccinated and developed symptoms on 11/28/20 She tested positive for the COVID-19 virus on 11/29/20 and was started on Paxlovid on 11/30/20 to complete a 5-day course Post ambulatory pulse oximetry was 86% and patient is currently on 2 L of oxygen.  Chest x-ray does not show any acute findings and CT  angiogram does not show evidence of acute pulmonary embolism or groundglass appearance suggestive of pneumonia Will place patient on systemic steroids Attempt to wean off oxygen as tolerated Obtain serial inflammatory markers     GERD Continue PPI    Anxiety disorder Continue as needed Ativan    Chronic systolic CHF Last known LVEF from a 2D echocardiogram done in 2021 was 20 to 25% Hold carvedilol, torsemide, spironolactone and Entresto for now due to relative hypotension. Maintain low-sodium diet   DVT prophylaxis: Lovenox  Code Status: full code  Family Communication: Greater than 50% of time was spent discussing patient's condition and plan of care with patient at the bedside.  All questions and concerns have been addressed.  She verbalizes understanding and agrees with the plan. Disposition Plan: Back to previous home environment Consults called: none  Status: At the time of admission, it appears that the appropriate admission status for this patient is inpatient.   This is judged to be reasonable and necessary in order to provide the required intensity of service to ensure the patient's safety given the presenting symptoms, physical exam findings, and initial radiographic and laboratory data in the context of their comorbid conditions. Patient requires inpatient status due to high intensity of service, high risk for further deterioration and high frequency of surveillance required.    Collier Bullock MD [facility]     12/02/2020, 8:58 AM

## 2020-12-02 NOTE — ED Notes (Signed)
Pt alert and oriented. Denies pain at this time. Reports recent diarrhea from [place].

## 2020-12-02 NOTE — ED Notes (Signed)
Pt med "paxlovid" stored in pharmacy, taken to pharmacy by this RN

## 2020-12-02 NOTE — Progress Notes (Signed)
Patient very concerned about her medications and requesting her heart medications be re-started. I explained to her that d/t her low BPs on arrival those medications were currently being held and that once MDs felt appropriate they would re-order. Attending MD notified.   Patient husband brought in nystatin that she takes from home. I explained to patient and husband that if the medication is something that we have on formulary in the hospital, they will be required to use our medication. MD notified of home use of nystatin and has ordered per patient's home regimen.   Husband also has dropped off [place] for patient which was delivered to her bedside.

## 2020-12-02 NOTE — ED Provider Notes (Addendum)
[facility] Emergency Department Provider Note   ____________________________________________   Event Date/Time   First MD Initiated Contact with Patient 12/02/20 [phone]     (approximate)  I have reviewed the triage vital signs and the nursing notes.   HISTORY  Chief Complaint Weakness    HPI Ashley [name] is a 75 y.o. female brought to the ED via EMS from home with a chief complaint of generalized weakness, syncope in the setting of current COVID-19 infection.  Patient with a history of COPD, CHF, AICD who saw her PCP on 7/18 for fever, generalized malaise, fatigue, congestion, body aches, headache and leg cramping.  She was recommended to perform a home COVID test which was positive.  Patient was started on Paxlovid.  Reports generalized fatigue and weakness.  Near syncopal episode at home prior to arrival.  Endorses some cough and shortness of breath.  Denies chest pain, abdominal pain, nausea, vomiting or diarrhea      Past Medical History:  Diagnosis Date   Abdominal aortic atherosclerosis ([place]) 11/2015   by CT   Allergy    Anemia    Anxiety    Automatic implantable cardioverter-defibrillator in situ 2012   a. MDT single lead ICD.   Bronchiectasis    a. possibly mild, treat URIs aggressively   Cancer ([place])    basal cell, arms & neck   CHF (congestive heart failure) (HCC)    Chronic sinusitis    COPD (chronic obstructive pulmonary disease) (HCC)    Depression    Dyspnea    Essential hypertension    Pt denies   Family history of adverse reaction to anesthesia    Mother - PONV   Fibrocystic breast disease    Fibromyalgia    GERD (gastroesophageal reflux disease)    H/O multiple allergies 10/10/2013   Hemorrhoids    History of diverticulitis of colon    History of pneumonia 2012   Insomnia    Irritable bowel syndrome    Laryngeal nodule    Dr. Thomasena Edis   Migraine without aura, without mention of intractable migraine without mention  of status migrainosus    Mitral regurgitation    Nonischemic cardiomyopathy ([place])    a. Recovered LV fxn - prev as low as 25%, now 50-55% by echo 04/2014;  b. 2012 s/p MDT [id number] [place] VR single lead ICD.   Osteoarthritis    knees, back, hands, low back,    Osteoporosis 01/2013   T-2.7 spine (01/2016)   PONV (postoperative nausea and vomiting)    sigmoid diverticulitis with perforation, abscess and fistula 09/30/2013   Sleep apnea 2010   borderline, inconclusive- /w      Patient Active Problem List   Diagnosis Date Noted   Fever 11/29/2020   COVID-19 virus infection 11/29/2020   UTI (urinary tract infection) 10/15/2020   Bilateral flank pain 10/15/2020   Chronic constipation 04/22/2020   Cervical pain (neck) 02/04/2019   Bilateral leg numbness 06/01/2018   Bursitis of right hip 06/01/2018   Mixed hyperlipidemia 01/17/2018   Plantar fasciitis, bilateral 12/13/2017   Acquired renal cyst of right kidney 08/11/2017   Unsteadiness on feet 07/24/2017   Multiple rib fractures 06/19/2017   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   High total serum IgM 11/22/2016   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/2016   Bleeding external hemorrhoids 10/31/2016   LBBB (left bundle branch block) 07/11/2016   Gastritis 06/12/2016   Fecal incontinence    Chronic generalized abdominal pain 11/22/2015  Abdominal aortic atherosclerosis ([place]) 11/13/2015   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   Sore throat 07/12/2015   Hoarseness 06/21/2015   Hypersensitivity to pneumococcal vaccine 06/10/2015   Medicare annual wellness visit, subsequent 06/08/2015   Advanced care planning/counseling discussion 06/08/2015   Intertrigo 04/06/2015   Chronic daily headache 04/06/2015   Peripheral neuropathy 04/06/2015   S/P colostomy takedown 02/10/2014   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 10/23/2013   H/O multiple allergies 10/10/2013   History of diverticular abscess 09/25/2013   Osteoporosis  01/13/2013   Irregular heart beat 08/06/2012   MDD (major depressive disorder), recurrent episode, moderate (HCC) 07/09/2012   Chronic recurrent sinusitis 06/17/2012   Vitamin D deficiency 03/05/2012   Essential tremor 12/29/2011   GERD (gastroesophageal reflux disease) 11/13/2011   Allergic rhinitis 11/13/2011   CKD (chronic kidney disease) stage 3, GFR 30-59 ml/min (HCC) 11/09/2011   Epistaxis, recurrent 10/11/2011   Cardiomyopathy, dilated, nonischemic ([place]) 03/01/2011   Mitral regurgitation 01/60/1093   Chronic systolic CHF (congestive heart failure) ([place]) 02/24/2011   Chronic bronchitis ([place]) 01/20/2011   Syncope 11/02/2010   Chronic fatigue and malaise 11/02/2010   Anxiety state 08/18/2010   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   COMMON MIGRAINE 11/22/2006   Irritable bowel syndrome 11/22/2006   FIBROCYSTIC BREAST DISEASE 11/22/2006   Osteoarthritis 11/22/2006   Fibromyalgia 11/22/2006    Past Surgical History:  Procedure Laterality Date   ABDOMINAL HYSTERECTOMY  1987   w/BSO   ANAL RECTAL MANOMETRY N/A 02/02/2016   Procedure: ANO RECTAL MANOMETRY;  Surgeon: Mauri Pole, MD;  Location: [facility];  Service: Endoscopy;  Laterality: N/A;   APPENDECTOMY     AUGMENTATION MAMMAPLASTY     BIV UPGRADE N/A 11/04/2018   Procedure: BIV ICD  UPGRADE;  Surgeon: Evans Lance, MD;  Location: [facility];  Service: Cardiovascular;  Laterality: N/A;   breast cystectomies     due to [place]  01/2010   Left-fibrocystic change w/intraductal papilloma, no malignancy   carotid US  23/5573   2-20% LICA, normal RICA   [place] Bunionectomy  11/04/08   Right Great Toe (Dr. Beola Cord)   COLONOSCOPY  01/2018   1 small benign polyp, diverticulosis, healthy anastomosis [name] @ [facility])   COLOSTOMY N/A 10/02/2013   DESCENDING COLOSTOMY;  Surgeon: Imogene Burn. [place], MD   COLOSTOMY REVERSAL N/A 02/10/2014   [name], MD   EMG/MCV  10/16/01   + mild carpal  tunnel   ESOPHAGOGASTRODUODENOSCOPY  09/2014   small HH, irregular Z line, fundic gland polyps with mild gastritis Olevia Perches)   Flex laryngoscopy  06/11/06   ([place]) nml   IMPLANTABLE CARDIOVERTER DEFIBRILLATOR IMPLANT N/A 07/14/2011   MDT single chamber ICD   KNEE ARTHROSCOPY W/ ORIF     Left; "meniscus tear"   LASIK     left eye   MANDIBLE SURGERY     "got about 6 pins in the bottom of my jaw"   NCS  11/2014   L ulnar neuropathy, mild B median nerve entrapment ([place])   OSTEOTOMY     Left foot   PARTIAL COLECTOMY N/A 10/02/2013   PARTIAL COLECTOMY;  Surgeon: Imogene Burn. Tsuei, MD   RIGHT/LEFT HEART CATH AND CORONARY ANGIOGRAPHY N/A 10/16/2018   Procedure: RIGHT/LEFT HEART CATH AND CORONARY ANGIOGRAPHY;  Surgeon: Wellington Hampshire, MD;  Location: [facility];  Service: Cardiovascular;  Laterality: N/A;   sinus surgery     x3 with balloon  TOE AMPUTATION     "toe beside baby toe on left foot; got gangrene from corn"   [place]      Prior to Admission medications   Medication Sig Start Date End Date Taking? Authorizing Provider  acetaminophen (TYLENOL) 500 MG tablet Take 500 mg by mouth daily as needed for moderate pain or headache (PAIN).    Yes [provider]  AMBULATORY NON FORMULARY MEDICATION Medication Name:  1 squatty potty to use as needed 01/20/16  Yes Nandigam, Venia Minks, MD  Ascorbic Acid (VITAMIN C) 1000 MG tablet Take 1,000 mg by mouth See admin instructions. Take 1000 mg daily, may take an additional 1000 mg as needed for cold symptoms   Yes [provider]  aspirin EC 81 MG tablet Take 81 mg by mouth at bedtime.    Yes [provider]  beclomethasone (QVAR REDIHALER) 80 MCG/ACT inhaler Inhale 1 puff into the lungs 2 (two) times daily. 08/25/20 08/25/21 Yes Tyler Pita, MD  benzonatate (TESSALON) 200 MG capsule Take 1 capsule (200 mg total) by mouth 3 (three) times daily as needed for cough. 06/08/20  Yes [name], MD  Biotin 10 MG CAPS Take 1 tablet by mouth daily.   Yes [provider]  carvedilol (COREG) 6.25 MG tablet TAKE 0.5 TABLETS (3.125 MG TOTAL) BY MOUTH 2 (TWO) TIMES DAILY WITH A MEAL. 10/29/20  Yes Minna Merritts, MD  cholecalciferol (VITAMIN D) 1000 units tablet Take 1,000 Units by mouth daily.    Yes [provider]  Coenzyme Q10 (COQ-10) 100 MG CAPS Take 100 mg by mouth daily.   Yes [provider]  dextromethorphan (DELSYM) 30 MG/5ML liquid Take 15 mg by mouth daily as needed for cough.    Yes [provider]  ezetimibe (ZETIA) 10 MG tablet Take 1 tablet (10 mg total) by mouth daily. 01/06/20  Yes Minna Merritts, MD  fluorouracil (EFUDEX) 5 % cream Apply topically. 06/05/20  Yes [provider]  gabapentin (NEURONTIN) 100 MG capsule Take 1 capsule (100 mg total) by mouth 2 (two) times daily. Take one nightly for the first 4 days 11/23/20  Yes [name], MD  GARLIC PO Take 1 capsule by mouth daily.   Yes [provider]  guaiFENesin (MUCINEX) 600 MG 12 hr tablet Take 600 mg by mouth 2 (two) times daily.   Yes [provider]  levalbuterol (XOPENEX HFA) 45 MCG/ACT inhaler Inhale 2 puffs into the lungs every 6 (six) hours as needed for wheezing or shortness of breath. 08/25/20  Yes Tyler Pita, MD  levofloxacin (LEVAQUIN) 750 MG tablet Take 1 tablet (750 mg total) by mouth every other day. 10/15/20  Yes [name], MD  LORazepam (ATIVAN) 0.5 MG tablet Take 0.5-1 tablets (0.25-0.5 mg total) by mouth at bedtime as needed for anxiety or sleep. 03/22/20  Yes [name], MD  magnesium 30 MG tablet Take 30 mg by mouth daily.   Yes [provider]  Menthol, Topical Analgesic, (BIOFREEZE ROLL-ON EX) Apply topically.   Yes [provider]  miconazole (MICOTIN) 2 % powder Apply 1 application topically as needed (yeast).   Yes [provider]  nirmatrelvir/ritonavir EUA, renal dosing,  (PAXLOVID) TABS Take 2 tablets by mouth 2 (two) times daily for 5 days. (Take nirmatrelvir 150 mg one tablet twice daily for 5 days and ritonavir 100 mg one tablet twice daily for 5 days) Patient GFR is 60 11/29/20 12/04/20 Yes [name], MD  nystatin (MYCOSTATIN) 100000 UNIT/ML suspension Take 5 mLs (500,000 Units total) by mouth 3 (three) times daily. 03/22/20  Yes [name], MD  nystatin cream (MYCOSTATIN) Apply 1 application topically as needed for dry skin (yeast infection). 09/11/19  Yes [name], MD  Omega-3 Fatty Acids (FISH OIL) 1000 MG CAPS Take 1,000 capsules by mouth daily.   Yes [provider]  pantoprazole (PROTONIX) 40 MG tablet Take 1 tablet (40 mg total) by mouth every Monday, Wednesday, and Friday. 08/11/20  Yes [name], MD  potassium chloride (KLOR-CON) 10 MEQ tablet Take 10 mEq by mouth 3 (three) times a week.   Yes [provider]  pyridOXINE (VITAMIN B-6) 100 MG tablet Take 100 mg by mouth daily.   Yes [provider]  sacubitril-valsartan (ENTRESTO) 49-51 MG Take 1 tablet by mouth daily.   Yes [provider]  spironolactone (ALDACTONE) 25 MG tablet TAKE 1 TABLET BY MOUTH EVERY DAY 10/29/20  Yes Gollan, Kathlene November, MD  torsemide (DEMADEX) 10 MG tablet Take 10 mg by mouth 3 (three) times a week.   Yes [provider]  vitamin E 400 UNIT capsule Take 400 Units by mouth daily.   Yes [provider]  Red Yeast Rice Extract (RED YEAST RICE PO) Take 1 capsule by mouth in the morning and at bedtime.  Patient not taking: Reported on 12/02/2020    [provider]  triamcinolone (NASACORT) 55 MCG/ACT AERO nasal inhaler Place into the nose. Patient not taking: Reported on 12/02/2020    [provider]  bisoprolol (ZEBETA) 5 MG tablet Take 5 mg by mouth daily. 06/06/11 07/02/18  Minna Merritts, MD    Allergies Atorvastatin; Chlorhexidine; Codeine; Cyclobenzaprine; Cymbalta [duloxetine  hcl]; Diflucan [fluconazole]; Dilaudid [hydromorphone hcl]; Furosemide; Influenza vac split [influenza virus vaccine]; Latex; Morphine and related; Pravastatin; Prevacid [lansoprazole]; Prevnar [pneumococcal 13-val conj vacc]; Rosuvastatin; Simvastatin; Tape; Metaxalone; Tramadol; Antihistamines, chlorpheniramine-type; Betadine [povidone iodine]; Onion; Other; Penicillins; Red dye; Sertraline; and Sulfasalazine  Family History  Problem Relation Age of Onset   Lung cancer Father        + smoker   Colon polyps Father    Dementia Mother    Osteoporosis Mother        Lumbar spine   Stroke Mother        x 5 @ 29 YOA   Arthritis Mother        hands   Emphysema Mother    Alcohol abuse Paternal Grandfather    Stroke Paternal Grandfather        ?   Heart disease Paternal Grandfather    Hypertension Maternal Grandfather        ?   Heart disease Paternal Grandmother    Colon cancer Neg Hx    Esophageal cancer Neg Hx    Gallbladder disease Neg Hx     Social History Social History   Tobacco Use   Smoking status: Never   Smokeless tobacco: Never   Tobacco comments:    Lived with smokers x 23 yrs, smoked herself "for a week"  Vaping Use   Vaping Use: Never used  Substance Use Topics   Alcohol use: No    Alcohol/week: 0.0 standard drinks   Drug use: No    Review of Systems  Constitutional: Positive for fevers and generalized malaise  Eyes: No visual changes. ENT: No sore throat. Cardiovascular: Denies chest pain. Respiratory: Positive for shortness of breath. Gastrointestinal: No abdominal pain.  No nausea, no vomiting.  No  diarrhea.  No constipation. Genitourinary: Negative for dysuria. Musculoskeletal: Negative for back pain. Skin: Negative for rash. Neurological: Positive for syncope.  Negative for headaches, focal weakness or numbness.   ____________________________________________   PHYSICAL EXAM:  VITAL SIGNS: ED Triage Vitals [12/02/20 0228]  Enc Vitals Group      BP (!) 89/64     Pulse Rate 73     Resp 18     Temp 99 F (37.2 C)     Temp Source Oral     SpO2      Weight      Height      Head Circumference      Peak Flow      Pain Score      Pain Loc      Pain Edu?      Excl. in [place]?     Constitutional: Alert and oriented.  Elderly appearing and in mild acute distress. Eyes: Conjunctivae are normal. PERRL. EOMI. Head: Atraumatic. Nose: Atraumatic. Mouth/Throat: Mucous membranes are mildly dry.  No dental malocclusion. Neck: No stridor.  No cervical spine tenderness to palpation. Cardiovascular: Normal rate, regular rhythm. Grossly normal heart sounds.  Good peripheral circulation. Respiratory: Normal respiratory effort.  No retractions. Lungs CTAB. Gastrointestinal: Soft and nontender to light or deep palpation. No distention. No abdominal bruits. No CVA tenderness. Musculoskeletal: No lower extremity tenderness nor edema.  No joint effusions. Neurologic: Alert and oriented x3.  CN II-XII grossly intact.  Normal speech and language. No gross focal neurologic deficits are appreciated. MAEx4. Skin:  Skin is warm, dry and intact. No rash noted. Psychiatric: Mood and affect are normal. Speech and behavior are normal.  ____________________________________________   LABS (all labs ordered are listed, but only abnormal results are displayed)  Labs Reviewed  COMPREHENSIVE METABOLIC PANEL - Abnormal; Notable for the following components:      Result Value   Sodium 134 (*)    CO2 20 (*)    Glucose, Bld 196 (*)    Creatinine, Ser 1.07 (*)    GFR, Estimated 54 (*)    All other components within normal limits  D-DIMER, QUANTITATIVE - Abnormal; Notable for the following components:   D-Dimer, Quant 2.53 (*)    All other components within normal limits  POC SARS CORONAVIRUS 2 AG -  ED - Abnormal; Notable for the following components:   SARS Coronavirus 2 Ag Positive (*)    All other components within normal limits  CULTURE, BLOOD (ROUTINE  X 2)  CULTURE, BLOOD (ROUTINE X 2)  URINE CULTURE  LACTIC ACID, PLASMA  LACTIC ACID, PLASMA  CBC WITH DIFFERENTIAL/PLATELET  BRAIN NATRIURETIC PEPTIDE  FIBRINOGEN  PROCALCITONIN  FERRITIN  LACTATE DEHYDROGENASE  URINALYSIS, COMPLETE (UACMP) WITH MICROSCOPIC  C-REACTIVE PROTEIN  HEPATITIS B SURFACE ANTIGEN  TYPE AND SCREEN  TROPONIN I (HIGH SENSITIVITY)  TROPONIN I (HIGH SENSITIVITY)   ____________________________________________  EKG  ED ECG REPORT I, Nishan Ovens J, the attending physician, personally viewed and interpreted this ECG.   Date: 12/02/2020  EKG Time: 0234  Rate: 75  Rhythm: normal sinus rhythm  Axis: Normal  Intervals:none  ST&T Change: Nonspecific  ____________________________________________  RADIOLOGY I, Arsenio Schnorr J, personally viewed and evaluated these images (plain radiographs) as part of my medical decision making, as well as reviewing the written report by the radiologist.  ED MD interpretation: No acute cardiopulmonary process; no ICH, no PE  Official radiology report(s): CT Head Wo Contrast  Result Date: 12/02/2020 CLINICAL DATA:  Syncope. EXAM: CT HEAD  WITHOUT CONTRAST TECHNIQUE: Contiguous axial images were obtained from the base of the skull through the vertex without intravenous contrast. COMPARISON:  09/29/2016 FINDINGS: Brain: There is no evidence for acute hemorrhage, hydrocephalus, mass lesion, or abnormal extra-axial fluid collection. No definite CT evidence for acute infarction. Vascular: No hyperdense vessel or unexpected calcification. Skull: No evidence for fracture. No worrisome lytic or sclerotic lesion. Sinuses/Orbits: The visualized paranasal sinuses and mastoid air cells are clear. Visualized portions of the globes and intraorbital fat are unremarkable. Other: None. IMPRESSION: Stable.  No acute intracranial abnormality. Electronically Signed   By: Misty Stanley M.D.   On: 12/02/2020 06:12   CT Angio Chest PE W/Cm &/Or Wo  Cm  Result Date: 12/02/2020 CLINICAL DATA:  Syncope at home.  COVID positive.  Positive D-dimer. EXAM: CT ANGIOGRAPHY CHEST WITH CONTRAST TECHNIQUE: Multidetector CT imaging of the chest was performed using the standard protocol during bolus administration of intravenous contrast. Multiplanar CT image reconstructions and MIPs were obtained to evaluate the vascular anatomy. CONTRAST:  49mL OMNIPAQUE IOHEXOL 350 MG/ML SOLN COMPARISON:  04/28/2011 FINDINGS: Cardiovascular: Dual-chamber pacer leads into the right heart. Prominent left ventricular size. No generalized cardiomegaly or pericardial effusion. Satisfactory opacification of the pulmonary arteries with no filling defect seen. Mediastinum/Nodes: Negative for adenopathy or mass Lungs/Pleura: Bronchomalacia with intermittent airway collapse. Only minor atelectasis however. Trace pleural fluid on the right. No consolidation or pneumothorax. Upper Abdomen: No acute finding Musculoskeletal: No acute finding. Remote and healed posterior right rib fractures. Review of the MIP images confirms the above findings. IMPRESSION: Negative for pulmonary embolism or other acute finding. Aortic Atherosclerosis (ICD10-I70.0). Electronically Signed   By: Monte Fantasia M.D.   On: 12/02/2020 06:20   DG Chest Port 1 View  Result Date: 12/02/2020 CLINICAL DATA:  Shortness of breath EXAM: PORTABLE CHEST 1 VIEW COMPARISON:  12/10/2018 FINDINGS: Lungs are clear.  No pleural effusion or pneumothorax. The heart is normal in size.  Left subclavian ICD. IMPRESSION: No evidence of acute cardiopulmonary disease. Electronically Signed   By: Julian Hy M.D.   On: 12/02/2020 03:09    ____________________________________________   PROCEDURES  Procedure(s) performed (including Critical Care):  .1-3 Lead EKG Interpretation  Date/Time: 12/02/2020 3:15 AM Performed by: Paulette Blanch, MD Authorized by: Paulette Blanch, MD     Interpretation: normal     ECG rate:  75   ECG  rate assessment: normal     Rhythm: sinus rhythm     Ectopy: none     Conduction: normal   Comments:     Patient placed on cardiac monitor to evaluate for arrhythmias   CRITICAL CARE Performed by: Paulette Blanch   Total critical care time: 30 minutes  Critical care time was exclusive of separately billable procedures and treating other patients.  Critical care was necessary to treat or prevent imminent or life-threatening deterioration.  Critical care was time spent personally by me on the following activities: development of treatment plan with patient and/or surrogate as well as nursing, discussions with consultants, evaluation of patient's response to treatment, examination of patient, obtaining history from patient or surrogate, ordering and performing treatments and interventions, ordering and review of laboratory studies, ordering and review of radiographic studies, pulse oximetry and re-evaluation of patient's condition.  ____________________________________________   INITIAL IMPRESSION / ASSESSMENT AND PLAN / ED COURSE  As part of my medical decision making, I reviewed the following data within the [place] notes reviewed and incorporated, Labs reviewed, EKG  interpreted, Old chart reviewed, Radiograph reviewed, Discussed with admitting physician, and Notes from prior ED visits     75 year old female presenting with syncopal episode, generalized weakness, shortness of breath in the setting of current COVID-19 infection taking Paxlovid. Differential includes, but is not limited to, viral syndrome, bronchitis including COPD exacerbation, pneumonia, reactive airway disease including asthma, CHF including exacerbation with or without pulmonary/interstitial edema, pneumothorax, ACS, thoracic trauma, and pulmonary embolism.   Will obtain lab work, chest x-ray, CT head.  There is no documented result of patient's positive home COVID test; will re-swab.  Will give  judicious IV fluids.  Anticipate hospitalization.  Clinical Course as of 12/02/20 0648  Thu Dec 02, 2020  0316 COVID+ on POC.  Will order IV Remdesivir and Solu-Medrol.  Patient awaiting imaging studies. [JS]  3888 Noted elevated D-dimer.  Given patient's COVID+ status coupled with syncope, will obtain CTA chest to evaluate for PE. [JS]  0449 Updated patient on test results thus far and hospitalization recommendation.  Patient refusing remdesivir.  States her PCP specifically told her not to miss a dose of Paxlovid.  She is concerned that she would be switching antiviral courses midcourse and is concerned that remdesivir would be a different dosage.  We will cancel remdesivir. [JS]  0609 Patient ambulated to commode and desaturated to 86% on room air.  Placed on 2 L nasal cannula oxygen. [JS]  0636 CT head and chest unremarkable.  Will discuss with hospitalist services for admission. [JS]    Clinical Course User Index [JS] Paulette Blanch, MD     ____________________________________________   FINAL CLINICAL IMPRESSION(S) / ED DIAGNOSES  Final diagnoses:  Generalized weakness  COVID-19  Near syncope  Hypoxia     ED Discharge Orders     None        Note:  This document was prepared using Dragon voice recognition software and may include unintentional dictation errors.    Paulette Blanch, MD 12/02/20 2800    Paulette Blanch, MD 12/02/20 [phone]

## 2020-12-02 NOTE — Progress Notes (Signed)
Lunch tray delivered to patient.

## 2020-12-02 NOTE — Progress Notes (Signed)
Another package delivered to patients bedside from husband. Dinner tray given.No c/o pain/ vomiting/ diarrhea.  Denies further needs at this time.

## 2020-12-02 NOTE — Telephone Encounter (Signed)
Per chart review tab pt is at [facility] ED and per ED note pt admitted. Sending note to Dr Darnell Level.

## 2020-12-02 NOTE — Telephone Encounter (Signed)
Noted. Records reviewed. Fortunately labs overall reassuring, CTA chest negative for PE.  Now that she's in the hospital, does have other antiviral options available that she may benefit from - would be ok to switch treatments as recommended by hospital team, especially if concern Paxlovid caused diarrhea and low blood pressures. Would follow recommendations of hospital team regarding treatment going forward.

## 2020-12-02 NOTE — Telephone Encounter (Signed)
Pt left v/m wanting Dr Darnell Level to know that pt is being admitted to [facility] due to pt having 4 - 6 times a day diarrhea, feeling like going to pass out and low BP.pt wanted Dr Darnell Level to know that hospital wanted to change antiviral med and pt refused; pt has 3 more days of paxlovid to take (pt said diarrhea started after starting covid treatment). Pt also request DR G to review all of testing (labs, CT and CXR)done at hospital and let pt know his opinion. Sending note to Dr Darnell Level and Lattie Haw CMA.

## 2020-12-02 NOTE — ED Triage Notes (Signed)
Patient reports syncopal episode at home; recently diagnosed COVID+; denies head trauma/ injury

## 2020-12-02 NOTE — ED Notes (Signed)
Pt assisted to restroom. Unable to collect UA, contaminated with stool sample, minimal diarrhea Pt desat to 86% with ambulation. Placed on 2L [place]. Dr Beather Arbour aware

## 2020-12-02 NOTE — Progress Notes (Signed)
Remdesivir - Pharmacy Brief Note   O:  ALT: 41  CXR:  SpO2: 96 % on RA   A/P:  Remdesivir 200 mg IVPB once followed by 100 mg IVPB daily x 4 days.   Melika Reder D 12/02/2020 4:26 AM

## 2020-12-02 NOTE — ED Notes (Signed)
Pt with multiple questions regarding lab results and asking about d/c. Dr Francine Graven notified, speaking with pt at bedside

## 2020-12-02 NOTE — ED Notes (Signed)
HOB repositioned. Call bell in reach

## 2020-12-02 NOTE — ED Notes (Signed)
Spouse called with update [phone]

## 2020-12-02 NOTE — Progress Notes (Signed)
Patient refused scheduled IV solu-medrol ,stated that she didn't need it because she didn't have any issues with her breathing. Educated patient that her oxygen levels were low and that is why she was put on a nasal canula. Patient said she felt fine and that she did not need it.   Patient is coughing quite a bit as well. Offered patient tessalon pearls which she requested be ordered "just in case" as she already had them in her home med list and takes them PRN.  Tessalon pearls were ordered by attending MD.   Upon giving medication to patient she stated she only wanted one of the pills. I explained to patient that 200 mg is the ordered dose in which she takes at home and the pills we have to give here are 100 mg each. Patient stated that she was just going to take one because she wasn't coughing that bad.

## 2020-12-02 NOTE — Telephone Encounter (Signed)
[facility] Night - Client TELEPHONE ADVICE RECORD [facility] Patient Name: Ashley [name] Gender: Female DOB: [birth year] Age: 75 Y 68 M 15 D Return Phone Number: [phone] (Primary) Address: City/ State/ Zip: [place] [address] Client [facility] Night - Client Client Site [place] Physician [name] - MD Contact Type Call Who Is Calling Patient / Member / Family / Caregiver Call Type Triage / Clinical Relationship To Patient Self Return Phone Number [phone] (Primary) Chief Complaint FAINTING or [place] Reason for Call Symptomatic / Request for Health Information Initial Comment Caller states she's almost blacked out today 3 times today. Her blood pressure just went down and the EMS had to get her. States she also has covid and diarrhea. Translation No No Triage Reason Patient declined Nurse Assessment Nurse: Quincy Simmonds, RN, Annita Brod Date/Time [name] Time): 12/02/2020 3:34:53 AM Confirm and document reason for call. If symptomatic, describe symptoms. ---Caller states she's almost blacked out today 3 times today. Her blood pressure just went down and the EMS had to get her. States she also has covid and diarrhea. Has a hx of heart failure, diarrhea started after taking paxlovid, and pacemaker went off, 72/48BP when she called paramedics. She is currently at the ER getting treated. Does the patient have any new or worsening symptoms? ---Yes Will a triage be completed? ---No Select reason for no triage. ---Patient declined Please document clinical information provided and list any resource used. ---She wanted to leave a note for her PCP letting them know what all happened tonight. Disp. Time [name] Time) Disposition Final User 12/02/2020 3:33:34 AM Send to Urgent Rondel Jumbo 12/02/2020 3:38:37 AM Clinical Call Yes Quincy Simmonds, RN, Annita Brod PLEASE NOTE: All  timestamps contained within this report are represented as [place] Standard Time. CONFIDENTIALTY NOTICE: This fax transmission is intended only for the addressee. It contains information that is legally privileged, confidential or otherwise protected from use or disclosure. If you are not the intended recipient, you are strictly prohibited from reviewing, disclosing, copying using or disseminating any of this information or taking any action in reliance on or regarding this information. If you have received this fax in error, please notify us immediately by telephone so that we can arrange for its return to [place]. Phone: [phone], Toll-Free: [phone], Fax: [phone] Page: 2 of 2 Call Id: [id number] Referrals [facility] - ED

## 2020-12-03 DIAGNOSIS — J9601 Acute respiratory failure with hypoxia: Secondary | ICD-10-CM | POA: Diagnosis not present

## 2020-12-03 DIAGNOSIS — N1831 Chronic kidney disease, stage 3a: Secondary | ICD-10-CM | POA: Diagnosis not present

## 2020-12-03 DIAGNOSIS — K219 Gastro-esophageal reflux disease without esophagitis: Secondary | ICD-10-CM

## 2020-12-03 DIAGNOSIS — U071 COVID-19: Principal | ICD-10-CM

## 2020-12-03 DIAGNOSIS — I5022 Chronic systolic (congestive) heart failure: Secondary | ICD-10-CM | POA: Diagnosis not present

## 2020-12-03 DIAGNOSIS — A0839 Other viral enteritis: Secondary | ICD-10-CM

## 2020-12-03 DIAGNOSIS — R55 Syncope and collapse: Secondary | ICD-10-CM | POA: Diagnosis not present

## 2020-12-03 LAB — COMPREHENSIVE METABOLIC PANEL
ALT: 41 U/L (ref 0–44)
AST: 37 U/L (ref 15–41)
Albumin: 3.9 g/dL (ref 3.5–5.0)
Alkaline Phosphatase: 84 U/L (ref 38–126)
Anion gap: 11 (ref 5–15)
BUN: 18 mg/dL (ref 8–23)
CO2: 23 mmol/L (ref 22–32)
Calcium: 9.7 mg/dL (ref 8.9–10.3)
Chloride: 103 mmol/L (ref 98–111)
Creatinine, Ser: 0.87 mg/dL (ref 0.44–1.00)
GFR, Estimated: >60 mL/min (ref >60)
Glucose, Bld: 172 mg/dL — ABNORMAL HIGH (ref 70–99)
Potassium: 4.1 mmol/L (ref 3.5–5.1)
Sodium: 137 mmol/L (ref 135–145)
Total Bilirubin: 0.5 mg/dL (ref 0.3–1.2)
Total Protein: 8.1 g/dL (ref 6.5–8.1)

## 2020-12-03 LAB — CBC WITH DIFFERENTIAL/PLATELET
Abs Immature Granulocytes: 0.03 10*3/uL (ref 0.00–0.07)
Basophils Absolute: 0 10*3/uL (ref 0.0–0.1)
Basophils Relative: 0 %
Eosinophils Absolute: 0 10*3/uL (ref 0.0–0.5)
Eosinophils Relative: 0 %
HCT: 40 % (ref 36.0–46.0)
Hemoglobin: 13.5 g/dL (ref 12.0–15.0)
Immature Granulocytes: 0 %
Lymphocytes Relative: 17 %
Lymphs Abs: 1.5 10*3/uL (ref 0.7–4.0)
MCH: 30.5 pg (ref 26.0–34.0)
MCHC: 33.8 g/dL (ref 30.0–36.0)
MCV: 90.3 fL (ref 80.0–100.0)
Monocytes Absolute: 0.9 10*3/uL (ref 0.1–1.0)
Monocytes Relative: 10 %
Neutro Abs: 6.5 10*3/uL (ref 1.7–7.7)
Neutrophils Relative %: 73 %
Platelets: 129 10*3/uL — ABNORMAL LOW (ref 150–400)
RBC: 4.43 MIL/uL (ref 3.87–5.11)
RDW: 12.8 % (ref 11.5–15.5)
WBC: 9 10*3/uL (ref 4.0–10.5)
nRBC: 0 % (ref 0.0–0.2)

## 2020-12-03 LAB — D-DIMER, QUANTITATIVE: D-Dimer, Quant: 2.23 ug/mL-FEU — ABNORMAL HIGH (ref 0.00–0.50)

## 2020-12-03 LAB — MAGNESIUM: Magnesium: 2.1 mg/dL (ref 1.7–2.4)

## 2020-12-03 LAB — FERRITIN: Ferritin: 346 ng/mL — ABNORMAL HIGH (ref 11–307)

## 2020-12-03 LAB — C-REACTIVE PROTEIN: CRP: 0.6 mg/dL (ref <1.0)

## 2020-12-03 LAB — PHOSPHORUS: Phosphorus: 2.7 mg/dL (ref 2.5–4.6)

## 2020-12-03 LAB — TROPONIN I (HIGH SENSITIVITY): Troponin I (High Sensitivity): 8 ng/L (ref <18)

## 2020-12-03 MED ORDER — CARVEDILOL 3.125 MG PO TABS
3.1250 mg | ORAL_TABLET | Freq: Two times a day (BID) | ORAL | Status: DC
  Administered 2020-12-03 – 2020-12-04 (×2): 3.125 mg via ORAL
  Filled 2020-12-03 (×2): qty 1

## 2020-12-03 MED ORDER — SACUBITRIL-VALSARTAN 49-51 MG PO TABS
1.0000 | ORAL_TABLET | Freq: Every day | ORAL | Status: DC
  Administered 2020-12-03 – 2020-12-04 (×2): 1 via ORAL
  Filled 2020-12-03 (×2): qty 1

## 2020-12-03 MED ORDER — SPIRONOLACTONE 25 MG PO TABS
25.0000 mg | ORAL_TABLET | Freq: Every day | ORAL | Status: DC
  Administered 2020-12-03 – 2020-12-04 (×2): 25 mg via ORAL
  Filled 2020-12-03 (×2): qty 1

## 2020-12-03 NOTE — ED Notes (Signed)
Informed RN bed assigned 

## 2020-12-03 NOTE — TOC Initial Note (Signed)
Transition of Care [employer]) - Initial/Assessment Note    Patient Details  Name: Ashley [name] MRN: [id number] Date of Birth: 08/27/1945  Transition of [facility]) CM/SW Contact:    Alberteen Sam, LCSW Phone Number: 12/03/2020, 2:13 PM  Clinical Narrative:                  CSW completed readmission risk assess, patient reports she has been moving around better and is trying to prove he does not need oxygen at home. CSW offered home health services, agreeable to [facility] RN to ensure stable at home and follow up on medical symptoms.   [facility]ne Cornea with [facility] accepted, confirmed with patient she lives in [place].   No other dc needs identified at this time.   Expected Discharge Plan: [place] Barriers to Discharge: Continued Medical Work up   Patient Goals and CMS Choice Patient states their goals for this hospitalization and ongoing recovery are:: to go home CMS Medicare.gov Compare Post Acute Care list provided to:: Patient Choice offered to / list presented to : Patient  Expected Discharge Plan and Services Expected Discharge Plan: [place] Choice: [place] arrangements for the past 2 months: Single Family Home                           HH Arranged: RN [facility] Agency: [place] ([place]) Date HH Agency Contacted: 12/03/20 Time HH Agency Contacted: 45 Representative spoke with at [place]: Corene Cornea  Prior Living Arrangements/Services Living arrangements for the past 2 months: [place] with:: Self Patient language and need for interpreter reviewed:: Yes Do you feel safe going back to the place where you live?: Yes      Need for Family Participation in Patient Care: Yes (Comment) Care giver support system in place?: Yes (comment)   Criminal Activity/Legal Involvement Pertinent to Current Situation/Hospitalization: No - Comment as needed  Activities of Daily Living       Permission Sought/Granted   Permission granted to share information with : Yes, Release of Information Signed     Permission granted to share info w AGENCY: HH        Emotional Assessment   Attitude/Demeanor/Rapport: Gracious Affect (typically observed): Calm Orientation: : Oriented to Self, Oriented to Place, Oriented to  Time, Oriented to Situation Alcohol / Substance Use: Not Applicable Psych Involvement: No (comment)  Admission diagnosis:  Syncope [R55] Hypoxia [R09.02] Generalized weakness [R53.1] Near syncope [R55] COVID-19 [U07.1] Patient Active Problem List   Diagnosis Date Noted   Acute respiratory failure ([place]) 12/02/2020   Acute respiratory failure due to COVID-19 ([place]) 12/02/2020   Fever 11/29/2020   Gastroenteritis due to COVID-19 virus 11/29/2020   UTI (urinary tract infection) 10/15/2020   Bilateral flank pain 10/15/2020   Chronic constipation 04/22/2020   Cervical pain (neck) 02/04/2019   Bilateral leg numbness 06/01/2018   Bursitis of right hip 06/01/2018   Mixed hyperlipidemia 01/17/2018   Plantar fasciitis, bilateral 12/13/2017   Acquired renal cyst of right kidney 08/11/2017   Unsteadiness on feet 07/24/2017   Multiple rib fractures 06/19/2017   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   High total serum IgM 11/22/2016   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[place]) 11/22/2016   Bleeding external hemorrhoids 10/31/2016   LBBB (left bundle branch block) 07/11/2016   Gastritis 06/12/2016   Fecal incontinence    Chronic generalized abdominal pain 11/22/2015  Abdominal aortic atherosclerosis ([place]) 11/13/2015   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   Sore throat 07/12/2015   Hoarseness 06/21/2015   Hypersensitivity to pneumococcal vaccine 06/10/2015   Medicare annual wellness visit, subsequent 06/08/2015   Advanced care planning/counseling discussion 06/08/2015   Intertrigo 04/06/2015   Chronic daily headache 04/06/2015   Peripheral neuropathy 04/06/2015   S/P colostomy takedown 02/10/2014    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 10/23/2013   H/O multiple allergies 10/10/2013   History of diverticular abscess 09/25/2013   Osteoporosis 01/13/2013   Near syncope 09/13/2012   Irregular heart beat 08/06/2012   MDD (major depressive disorder), recurrent episode, moderate (HCC) 07/09/2012   Chronic recurrent sinusitis 06/17/2012   Vitamin D deficiency 03/05/2012   Essential tremor 12/29/2011   GERD (gastroesophageal reflux disease) 11/13/2011   Allergic rhinitis 11/13/2011   CKD (chronic kidney disease) stage 3, GFR 30-59 ml/min (HCC) 11/09/2011   Epistaxis, recurrent 10/11/2011   Cardiomyopathy, dilated, nonischemic ([place]) 03/01/2011   Mitral regurgitation [id number]   Chronic systolic CHF (congestive heart failure) ([place]) 02/24/2011   Chronic bronchitis ([place]) 01/20/2011   Syncope 11/02/2010   Chronic fatigue and malaise 11/02/2010   Anxiety state 08/18/2010   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   COMMON MIGRAINE 11/22/2006   Irritable bowel syndrome 11/22/2006   FIBROCYSTIC BREAST DISEASE 11/22/2006   Osteoarthritis 11/22/2006   Fibromyalgia 11/22/2006   PCP:  [name], MD Pharmacy:   [id number] [place], [id number]: [phone]: [phone]    Social Determinants of Health (SDOH) Interventions    Readmission Risk Interventions No flowsheet data found.

## 2020-12-03 NOTE — Progress Notes (Addendum)
PROGRESS NOTE  Ashley [name]  [id number] DOB: 06/27/1945 DOA: 12/02/2020 PCP: [name], MD   Brief Narrative: Ashley [name] is a 75 y.o. female with a history of COPD, chronic combined HFrEF, SSS s/p PPM/ICD, multiple medication intolerances, and recent covid-19 diagnosis 7/18, started on paxlovid 7/19 who presented to the ED 7/21 with progressive worsening diarrhea, fatigue, and generalized weakness in the setting of taking paxlovid. She reported feeling like she would pass out even while seated, noted to have low blood pressure and EMS brought her to the ED where she was hypoxic to 86% on ambulation to the bathroom. WBC 5.2k, PCT <0.10, lactic acid 1.6, d-dimer 2.53. CT head was nonacute, CTA chest showed no significant abnormalities including no PE.  Assessment & Plan: Principal Problem:   Near syncope Active Problems:   Chronic systolic CHF (congestive heart failure) (HCC)   CKD (chronic kidney disease) stage 3, GFR 30-59 ml/min (HCC)   GERD (gastroesophageal reflux disease)   Gastroenteritis due to COVID-19 virus   Acute respiratory failure (HCC)   Acute respiratory failure due to COVID-19 [facility])  Acute hypoxic respiratory failure: Presumably due to covid-19 though no pneumonia noted on CTA chest. No PE, effusion, pulmonary edema, significant atelectasis, or consolidation noted.  - Repeat ambulatory pulse oximetry today.   Covid-19 infection:  - Will continue paxlovid for planned 5 days Tx 7/19 - 7/23 as long as diarrhea remains resolved. Otherwise, with negative inflammatory markers and CTA chest, would suspect risk is greater than benefit. The patient is currently quite adamant that we continue this medication.  - DC steroids  Diarrhea:  - Supportive care, s/p IVF resuscitation, continue prn antiemetics.   Chronic HFrEF: LVEF from 2021 was 20-25%.  - With improving vital signs, we will restart home coreg, entresto, spironolactone.  - No pulmonary edema or  peripheral overload by exam. Will stop IVF and hold diuretic for now.   HLD:  - Continue zetia, many statin allergies.   GERD:  - Continue PPI.   Anxiety disorder:  - prn ativan  Multiple medication intolerances:  - Approach new medications with extreme caution.   Thrombocytopenia: Likely viral effect.  - Recheck in AM  Obesity: Estimated body mass index is 30.19 kg/m as calculated from the following:   Height as of this encounter: '5\' 5"'$  (1.651 m).   Weight as of this encounter: 82.3 kg.  DVT prophylaxis: Lovenox Code Status: Full Family Communication: None at bedside Disposition Plan:  Status is: Inpatient  Remains inpatient appropriate because:Inpatient level of care appropriate due to severity of illness  Dispo: The patient is from: Home              Anticipated d/c is to: Home              Patient currently is not medically stable to d/c.   Difficult to place patient No  Consultants:  None  Procedures:  None  Antimicrobials: Paxlovid   Subjective: Feels better, no diarrhea since arrival. Has gotten up and walked around the room, exerted herself with minimal dyspnea and no chest pain.   Objective: Vitals:   12/03/20 1144 12/03/20 1151 12/03/20 1418 12/03/20 1426  BP: (!) 142/63     Pulse: 78 93 81 90  Resp: 18     Temp: 98.2 F (36.8 C)     TempSrc:      SpO2: 94% 97% 97% 97%  Weight:      Height:  Intake/Output Summary (Last 24 hours) at 12/03/2020 1654 Last data filed at 12/03/2020 1424 Gross per 24 hour  Intake 960 ml  Output --  Net 960 ml   Filed Weights   12/02/20 0231 12/03/20 0936  Weight: 78.9 kg 82.3 kg    Gen: 75 y.o. female in no distress Pulm: Non-labored breathing on supplemental oxygen. Clear to auscultation bilaterally.  CV: Regular rate and rhythm. No murmur, rub, or gallop. No JVD, no pedal edema. GI: Abdomen soft, non-tender, non-distended, with normoactive bowel sounds. No organomegaly or masses felt. Ext: Warm,  no deformities Skin: No rashes, lesions or ulcers on visualized skin. Neuro: Alert and oriented. No focal neurological deficits. Psych: Judgement and insight appear normal. Mood & affect appropriate.   Data Reviewed: I have personally reviewed following labs and imaging studies  CBC: Recent Labs  Lab 12/02/20 0229 12/03/20 0648  WBC 5.2 9.0  NEUTROABS 3.1 6.5  HGB 13.2 13.5  HCT 41.0 40.0  MCV 92.8 90.3  PLT 150 [id number]*   Basic Metabolic Panel: Recent Labs  Lab 12/02/20 0229 12/03/20 0648  NA 134* 137  K 3.5 4.1  CL 104 103  CO2 20* 23  GLUCOSE 196* 172*  BUN 15 18  CREATININE 1.07* 0.87  CALCIUM 9.3 9.7  MG  --  2.1  PHOS  --  2.7   GFR: Estimated Creatinine Clearance: 59.2 mL/min (by C-G formula based on SCr of 0.87 mg/dL). Liver Function Tests: Recent Labs  Lab 12/02/20 0229 12/03/20 0648  AST 41 37  ALT 41 41  ALKPHOS 84 84  BILITOT 0.6 0.5  PROT 7.6 8.1  ALBUMIN 4.0 3.9   No results for input(s): LIPASE, AMYLASE in the last 168 hours. No results for input(s): AMMONIA in the last 168 hours. Coagulation Profile: No results for input(s): INR, PROTIME in the last 168 hours. Cardiac Enzymes: No results for input(s): CKTOTAL, CKMB, CKMBINDEX, TROPONINI in the last 168 hours. BNP (last 3 results) No results for input(s): PROBNP in the last 8760 hours. HbA1C: No results for input(s): HGBA1C in the last 72 hours. CBG: No results for input(s): GLUCAP in the last 168 hours. Lipid Profile: No results for input(s): CHOL, HDL, LDLCALC, TRIG, CHOLHDL, LDLDIRECT in the last 72 hours. Thyroid Function Tests: No results for input(s): TSH, T4TOTAL, FREET4, T3FREE, THYROIDAB in the last 72 hours. Anemia Panel: Recent Labs    12/02/20 0338 12/03/20 0648  FERRITIN 245 346*   Urine analysis:    Component Value Date/Time   COLORURINE YELLOW 09/01/2019 0124   APPEARANCEUR HAZY (A) 09/01/2019 0124   LABSPEC 1.020 09/01/2019 0124   PHURINE 5.0 09/01/2019 0124    GLUCOSEU NEGATIVE 09/01/2019 0124   HGBUR NEGATIVE 09/01/2019 0124   BILIRUBINUR negative 10/26/2020 1646   KETONESUR NEGATIVE 09/01/2019 0124   PROTEINUR Negative 10/26/2020 1646   PROTEINUR NEGATIVE 09/01/2019 0124   UROBILINOGEN 0.2 10/26/2020 1646   UROBILINOGEN 0.2 05/27/2014 1952   NITRITE negative 10/26/2020 1646   NITRITE NEGATIVE 09/01/2019 0124   LEUKOCYTESUR Negative 10/26/2020 1646   LEUKOCYTESUR MODERATE (A) 09/01/2019 0124   Recent Results (from the past 240 hour(s))  Culture, blood (routine x 2)     Status: None (Preliminary result)   Collection Time: 12/02/20  2:34 AM   Specimen: BLOOD RIGHT FOREARM  Result Value Ref Range Status   Specimen Description BLOOD RIGHT FOREARM  Final   Special Requests   Final    BOTTLES DRAWN AEROBIC AND ANAEROBIC Blood Culture adequate volume  Culture   Final    NO GROWTH 1 DAY Performed at [facility] [address] [place], [place] [address]    Report Status PENDING  Incomplete  Culture, blood (routine x 2)     Status: None (Preliminary result)   Collection Time: 12/02/20  2:39 AM   Specimen: Right Antecubital; Blood  Result Value Ref Range Status   Specimen Description RIGHT ANTECUBITAL  Final   Special Requests   Final    BOTTLES DRAWN AEROBIC AND ANAEROBIC Blood Culture adequate volume   Culture   Final    NO GROWTH 1 DAY Performed at [facility], [address] [place], [place] [address]    Report Status PENDING  Incomplete      Radiology Studies: CT Head Wo Contrast  Result Date: 12/02/2020 CLINICAL DATA:  Syncope. EXAM: CT HEAD WITHOUT CONTRAST TECHNIQUE: Contiguous axial images were obtained from the base of the skull through the vertex without intravenous contrast. COMPARISON:  09/29/2016 FINDINGS: Brain: There is no evidence for acute hemorrhage, hydrocephalus, mass lesion, or abnormal extra-axial fluid collection. No definite CT evidence for acute infarction. Vascular: No hyperdense vessel  or unexpected calcification. Skull: No evidence for fracture. No worrisome lytic or sclerotic lesion. Sinuses/Orbits: The visualized paranasal sinuses and mastoid air cells are clear. Visualized portions of the globes and intraorbital fat are unremarkable. Other: None. IMPRESSION: Stable.  No acute intracranial abnormality. Electronically Signed   By: Misty Stanley M.D.   On: 12/02/2020 06:12   CT Angio Chest PE W/Cm &/Or Wo Cm  Result Date: 12/02/2020 CLINICAL DATA:  Syncope at home.  COVID positive.  Positive D-dimer. EXAM: CT ANGIOGRAPHY CHEST WITH CONTRAST TECHNIQUE: Multidetector CT imaging of the chest was performed using the standard protocol during bolus administration of intravenous contrast. Multiplanar CT image reconstructions and MIPs were obtained to evaluate the vascular anatomy. CONTRAST:  44m OMNIPAQUE IOHEXOL 350 MG/ML SOLN COMPARISON:  04/28/2011 FINDINGS: Cardiovascular: Dual-chamber pacer leads into the right heart. Prominent left ventricular size. No generalized cardiomegaly or pericardial effusion. Satisfactory opacification of the pulmonary arteries with no filling defect seen. Mediastinum/Nodes: Negative for adenopathy or mass Lungs/Pleura: Bronchomalacia with intermittent airway collapse. Only minor atelectasis however. Trace pleural fluid on the right. No consolidation or pneumothorax. Upper Abdomen: No acute finding Musculoskeletal: No acute finding. Remote and healed posterior right rib fractures. Review of the MIP images confirms the above findings. IMPRESSION: Negative for pulmonary embolism or other acute finding. Aortic Atherosclerosis (ICD10-I70.0). Electronically Signed   By: JMonte FantasiaM.D.   On: 12/02/2020 06:20   DG Chest Port 1 View  Result Date: 12/02/2020 CLINICAL DATA:  Shortness of breath EXAM: PORTABLE CHEST 1 VIEW COMPARISON:  12/10/2018 FINDINGS: Lungs are clear.  No pleural effusion or pneumothorax. The heart is normal in size.  Left subclavian ICD.  IMPRESSION: No evidence of acute cardiopulmonary disease. Electronically Signed   By: SJulian HyM.D.   On: 12/02/2020 03:09    Scheduled Meds:  vitamin C  1,000 mg Oral Daily   aspirin EC  81 mg Oral QHS   cholecalciferol  1,000 Units Oral Daily   enoxaparin (LOVENOX) injection  40 mg Subcutaneous Q24H   ezetimibe  10 mg Oral Daily   fluticasone  2 puff Inhalation BID   gabapentin  100 mg Oral BID   magnesium chloride  64 mg Oral Daily   methylPREDNISolone (SOLU-MEDROL) injection  1 mg/kg Intravenous Q12H   Followed by   [Derrill MemoON 12/05/2020] predniSONE  50 mg Oral Daily   nirmatrelvir/ritonavir EUA (renal dosing)  2 tablet Oral BID   nystatin  5 mL Oral TID   omega-3 acid ethyl esters  1 g Oral Daily   pantoprazole  40 mg Oral Q M,W,F   pyridOXINE  100 mg Oral Daily   sodium chloride flush  3 mL Intravenous Q12H   vitamin E  400 Units Oral Daily   Continuous Infusions:  sodium chloride       LOS: 1 day   Time spent: 25 minutes.  Patrecia Pour, MD [facility] [email or web address] 12/03/2020, 4:54 PM

## 2020-12-03 NOTE — Telephone Encounter (Signed)
Nystatin suspension Last filled:  10/09/20, #120 mL Last OV:  11/23/20, B flank pain Next OV:  12/31/20, 6 mo f/u

## 2020-12-03 NOTE — ED Notes (Signed)
Pt assisted to bedside commode

## 2020-12-04 ENCOUNTER — Other Ambulatory Visit: Payer: Self-pay

## 2020-12-04 DIAGNOSIS — U071 COVID-19: Secondary | ICD-10-CM | POA: Diagnosis not present

## 2020-12-04 DIAGNOSIS — N1831 Chronic kidney disease, stage 3a: Secondary | ICD-10-CM | POA: Diagnosis not present

## 2020-12-04 DIAGNOSIS — J9600 Acute respiratory failure, unspecified whether with hypoxia or hypercapnia: Secondary | ICD-10-CM

## 2020-12-04 DIAGNOSIS — I5022 Chronic systolic (congestive) heart failure: Secondary | ICD-10-CM | POA: Diagnosis not present

## 2020-12-04 DIAGNOSIS — R55 Syncope and collapse: Secondary | ICD-10-CM | POA: Diagnosis not present

## 2020-12-04 LAB — CBC WITH DIFFERENTIAL/PLATELET
Abs Immature Granulocytes: 0.1 10*3/uL — ABNORMAL HIGH (ref 0.00–0.07)
Basophils Absolute: 0 10*3/uL (ref 0.0–0.1)
Basophils Relative: 0 %
Eosinophils Absolute: 0 10*3/uL (ref 0.0–0.5)
Eosinophils Relative: 0 %
HCT: 43.3 % (ref 36.0–46.0)
Hemoglobin: 14.4 g/dL (ref 12.0–15.0)
Immature Granulocytes: 1 %
Lymphocytes Relative: 13 %
Lymphs Abs: 1.8 10*3/uL (ref 0.7–4.0)
MCH: 29.8 pg (ref 26.0–34.0)
MCHC: 33.3 g/dL (ref 30.0–36.0)
MCV: 89.5 fL (ref 80.0–100.0)
Monocytes Absolute: 1.1 10*3/uL — ABNORMAL HIGH (ref 0.1–1.0)
Monocytes Relative: 8 %
Neutro Abs: 10.7 10*3/uL — ABNORMAL HIGH (ref 1.7–7.7)
Neutrophils Relative %: 78 %
Platelets: 226 10*3/uL (ref 150–400)
RBC: 4.84 MIL/uL (ref 3.87–5.11)
RDW: 12.8 % (ref 11.5–15.5)
WBC: 13.7 10*3/uL — ABNORMAL HIGH (ref 4.0–10.5)
nRBC: 0 % (ref 0.0–0.2)

## 2020-12-04 MED ORDER — BENZONATATE 100 MG PO CAPS
200.0000 mg | ORAL_CAPSULE | Freq: Two times a day (BID) | ORAL | Status: DC
  Administered 2020-12-04: 200 mg via ORAL
  Filled 2020-12-04: qty 2

## 2020-12-04 MED ORDER — FISH OIL 1000 MG PO CAPS: 1.0000 | ORAL_CAPSULE | Freq: Every day | ORAL | 0 refills | Status: DC

## 2020-12-04 NOTE — Evaluation (Signed)
Physical Therapy Evaluation Patient Details Name: Ashley [name] MRN: [id number] DOB: 03/30/1946 Today's Date: 12/04/2020   History of Present Illness  Pt admitted to [facility] on 12/02/20 for c/o generalized weakness, diarrhea, fatigue, and syncope due to COVID. Pt recently seen at PCP and was started on Paxlovid for COVID. Significant PMH includes: COPD, CHF HfrEF, SSS s/p PPM/ICD, multiple medication intolerance. Imaging negative for significant abdnormalities/PE.   Clinical Impression  Pt is a 75 year old F admitted to hospital on 12/02/20 for COVID related symptoms. At baseline, pt is independent with all ADL's, IADL's, and community ambulation without AD; will practice activity modification/pacing PRN due to PMH of CHF. Pt with +1 non-pitting edema of bil feet, which she notes is chronic. Pt presents with adequate strength, balance, safety awareness, and activity tolerance required for safe functional mobility. Pt was grossly Ind for bed mobility, transfers, and gait in room without AD. Pt endorses being at baseline without deficits. Pt not appropriate for skilled acute PT services at this time. Will recommend DC home with no further therapy needs. Pt agreeable.     Follow Up Recommendations No PT follow up    Equipment Recommendations  None recommended by PT       Precautions / Restrictions Precautions Precautions: None Restrictions Weight Bearing Restrictions: No      Mobility  Bed Mobility Overal bed mobility: Independent         Transfers Overall transfer level: Independent      Ambulation/Gait Ambulation/Gait assistance: Independent Gait Distance (Feet): 75 Feet         General Gait Details: Independently ambulates in room without AD. Demonstrates good balance with reciprocal gait pattern, no evidence for imbalance. Has been independently walking in room.     Balance Overall balance assessment: Independent            Pertinent Vitals/Pain Pain Assessment:  No/denies pain    Home Living Family/patient expects to be discharged to:: Private residence Living Arrangements: Spouse/significant other Available Help at Discharge: Family;Friend(s);Available 24 hours/day Type of Home: House (condo) Home Access: Level entry     Home Layout: One level Home Equipment: Grab bars - tub/shower (walking stick)      Prior Function Level of Independence: Independent         Comments: Ind ADL's, IADL's, and community ambulation without AD. Intermittent would using walking stick if she was feeling bad. Required increased time/effort and practiced activity modification/pacing due to CHF.     Hand Dominance   Dominant Hand: Right    Extremity/Trunk Assessment   Upper Extremity Assessment Upper Extremity Assessment: Defer to OT evaluation    Lower Extremity Assessment Lower Extremity Assessment: Overall WFL for tasks assessed (Hip flexion and DF 4+5, otherwise grossly 5/5. Sensation and coordination intact.)    Cervical / Trunk Assessment Cervical / Trunk Assessment: Normal  Communication   Communication: No difficulties  Cognition Arousal/Alertness: Awake/alert Behavior During Therapy: WFL for tasks assessed/performed Overall Cognitive Status: Within Functional Limits for tasks assessed            General Comments: A&O x4 able to follow 100% of multistep commands      General Comments General comments (skin integrity, edema, etc.): +1 non pitting edema of bil feet which she states is chronic    Exercises Other Exercises Other Exercises: Pt able to perform bed mobility, transfers, and gait without physical assist or AD. Demonstrates good safety awareness. Other Exercises: Pt educated regarding: PT role/POC, DC recommendations, continuation of activity  modification/pacing, and using of walking stick PRN. She verbalized understanding.   Assessment/Plan    PT Assessment Patent does not need any further PT services         PT Goals  (Current goals can be found in the Care Plan section)  Acute Rehab PT Goals Patient Stated Goal: "to go home before 12pm" PT Goal Formulation: With patient Time For Goal Achievement: 12/11/20 Potential to Achieve Goals: Good     AM-PAC PT "6 Clicks" Mobility  Outcome Measure Help needed turning from your back to your side while in a flat bed without using bedrails?: None Help needed moving from lying on your back to sitting on the side of a flat bed without using bedrails?: None Help needed moving to and from a bed to a chair (including a wheelchair)?: None Help needed standing up from a chair using your arms (e.g., wheelchair or bedside chair)?: None Help needed to walk in hospital room?: None Help needed climbing 3-5 steps with a railing? : A Little 6 Click Score: 23    End of Session Equipment Utilized During Treatment: Gait belt Activity Tolerance: Patient tolerated treatment well Patient left: in bed;with call bell/phone within reach Nurse Communication: Mobility status PT Visit Diagnosis: Unsteadiness on feet (R26.81);Muscle weakness (generalized) (M62.81)    Time: [id number] PT Time Calculation (min) (ACUTE ONLY): 11 min   Charges:   PT Evaluation $PT Eval Low Complexity: 1 Low         Herminio Commons, PT, DPT 9:27 AM,12/04/20

## 2020-12-04 NOTE — Evaluation (Signed)
Occupational Therapy Evaluation Patient Details Name: Ashley [name] MRN: [id number] DOB: [birth year] Today's Date: 12/04/2020    History of Present Illness Pt admitted to [facility] on 12/02/20 for c/o generalized weakness, diarrhea, fatigue, and syncope due to COVID. Pt recently seen at PCP and was started on Paxlovid for COVID. Significant PMH includes: COPD, CHF HfrEF, SSS s/p PPM/ICD, multiple medication intolerance. Imaging negative for significant abdnormalities/PE.   Clinical Impression   Ashley [name] is spry, talkative, pleasant this AM. She denies pain, SOB, or dizziness. She is able to perform bed mobility, transfers, UB and LB dressing, grooming, and dancing, all w/o AD, no LOB, while displaying good safety awareness. Pt reports feeling back to her baseline level of function, IND in ADL and IADL. No further OT services required at this time.     Follow Up Recommendations  No OT follow up    Equipment Recommendations  None recommended by OT    Recommendations for Other Services       Precautions / Restrictions Precautions Precautions: None Restrictions Weight Bearing Restrictions: No      Mobility Bed Mobility Overal bed mobility: Independent                  Transfers Overall transfer level: Independent                    Balance Overall balance assessment: Independent                                         ADL either performed or assessed with clinical judgement   ADL Overall ADL's : Independent                                             Vision Patient Visual Report: No change from baseline       Perception     Praxis      Pertinent Vitals/Pain Pain Assessment: No/denies pain     Hand Dominance Right   Extremity/Trunk Assessment Upper Extremity Assessment Upper Extremity Assessment: Overall WFL for tasks assessed   Lower Extremity Assessment Lower Extremity Assessment: Overall WFL for tasks  assessed   Cervical / Trunk Assessment Cervical / Trunk Assessment: Normal   Communication Communication Communication: No difficulties   Cognition Arousal/Alertness: Awake/alert Behavior During Therapy: WFL for tasks assessed/performed Overall Cognitive Status: Within Functional Limits for tasks assessed                                 General Comments: A&O x4 able to follow 100% of multistep commands   General Comments  +1 non pitting edema of bil feet which she states is chronic    Exercises Other Exercises Other Exercises: Pt able to perform bed mobility, transfers, grooming, dressing without physical assist or AD. Demonstrates good safety awareness. Other Exercises: Pt educated regarding: PT role/POC, DC recommendations, continuation of activity modification/pacing, and using of walking stick PRN. She verbalized understanding.   Shoulder Instructions      Home Living Family/patient expects to be discharged to:: Private residence Living Arrangements: Spouse/significant other Available Help at Discharge: Family;Friend(s);Available 24 hours/day Type of Home: House Home Access: Level entry     Home Layout: One level  Bathroom Shower/Tub: Occupational psychologist: Standard     Home Equipment: Grab bars - tub/shower          Prior Functioning/Environment Level of Independence: Independent        Comments: Ind ADL's, IADL's, and community ambulation without AD. Intermittent would using walking stick if she was feeling bad. Required increased time/effort and practiced activity modification/pacing due to CHF.        OT Problem List: Decreased strength;Decreased activity tolerance;Cardiopulmonary status limiting activity      OT Treatment/Interventions:      OT Goals(Current goals can be found in the care plan section) Acute Rehab OT Goals Patient Stated Goal: "to go home before 12pm" OT Goal Formulation: With patient Time For Goal  Achievement: 12/18/20 Potential to Achieve Goals: Good  OT Frequency:     Barriers to D/C:            Co-evaluation              AM-PAC OT "6 Clicks" Daily Activity     Outcome Measure Help from another person eating meals?: None Help from another person taking care of personal grooming?: None Help from another person toileting, which includes using toliet, bedpan, or urinal?: None Help from another person bathing (including washing, rinsing, drying)?: None Help from another person to put on and taking off regular upper body clothing?: None Help from another person to put on and taking off regular lower body clothing?: None 6 Click Score: 24   End of Session    Activity Tolerance: Patient tolerated treatment well Patient left: in bed;with nursing/sitter in room  OT Visit Diagnosis: Muscle weakness (generalized) (M62.81)                Time: [id number] OT Time Calculation (min): 9 min Charges:  OT General Charges $OT Visit: 1 Visit OT Evaluation $OT Eval Low Complexity: 1 Low OT Treatments $Self Care/Home Management : 8-22 mins Josiah Lobo, PhD, MS, OTR/L 12/04/20, 10:27 AM

## 2020-12-04 NOTE — Discharge Summary (Signed)
Physician Discharge Summary  Ashley [name] [id number] DOB: 01/26/1946 DOA: 12/02/2020  PCP: [name], MD  Admit date: 12/02/2020 Discharge date: 12/04/2020  Admitted From: Home Disposition: Home   Recommendations for Outpatient Follow-up:  Follow up with PCP in 1-2 weeks  Home Health: None Equipment/Devices: None Discharge Condition: Stable CODE STATUS: Full Diet recommendation: Heart healthy  Brief/Interim Summary: Ashley [name] is a 75 y.o. female with a history of COPD, chronic combined HFrEF, SSS s/p PPM/ICD, multiple medication intolerances, and recent covid-19 diagnosis 7/18, started on paxlovid 7/19 who presented to the ED 7/21 with progressive worsening diarrhea, fatigue, and generalized weakness in the setting of taking paxlovid. She reported feeling like she would pass out even while seated, noted to have low blood pressure and EMS brought her to the ED where she was hypoxic to 86% on ambulation to the bathroom. WBC 5.2k, PCT <0.10, lactic acid 1.6, d-dimer 2.53. CT head was nonacute, CTA chest showed no significant abnormalities including no PE.  Discharge Diagnoses:  Principal Problem:   Near syncope Active Problems:   Chronic systolic CHF (congestive heart failure) (HCC)   CKD (chronic kidney disease) stage 3, GFR 30-59 ml/min (HCC)   GERD (gastroesophageal reflux disease)   Gastroenteritis due to COVID-19 virus   Acute respiratory failure (HCC)   Acute respiratory failure due to COVID-19 [facility])  Acute hypoxic respiratory failure: Presumably due to covid-19 though no pneumonia noted on CTA chest. No PE, effusion, pulmonary edema, significant atelectasis, or consolidation noted. - Has resolved.   Covid-19 infection: - Will continue paxlovid for planned 5 days Tx 7/19 - 7/23.   - DC steroids - Maintain isolation x10 days.   Diarrhea: Resolved.   Chronic HFrEF: LVEF from 2021 was 20-25%. No pulmonary edema or peripheral overload by exam. Restart  home medications.   HLD: Continue zetia, many statin allergies.   GERD: - Continue PPI.   Anxiety disorder: - prn ativan   Multiple medication intolerances: - Approach new medications with extreme caution.    Thrombocytopenia: Likely viral effect. Resolved.   Obesity: Estimated body mass index is 30.19 kg/m  Discharge Instructions Discharge Instructions     Diet - low sodium heart healthy   Complete by: As directed    Discharge instructions   Complete by: As directed    You have improved significantly and are stable for discharge with plans to complete the course of paxlovid this evening. You will need to remain in isolation for 10 days from diagnosis. It is recommended that you follow up with your PCP in the next 1-2 weeks or seek medical attention sooner if your symptoms return.   Increase activity slowly   Complete by: As directed    MyChart COVID-19 home monitoring program   Complete by: Dec 04, 2020    Is the patient willing to use the [facility] for home monitoring?: Yes      Allergies as of 12/04/2020       Reactions   Atorvastatin Other (See Comments)   Severe muscle cramps to generic atorvastatin.  Able to take brand lipitor.   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    Influenza Vac Split [influenza Virus Vaccine] Other (See Comments)   "allergic to concentrated eggs that are in vaccine"   Latex Nausea Only, Rash, Other (See  Comments)   Pain and infection   Morphine And Related Other (See Comments)   Internally feels like she is on fire    Pravastatin Other (See Comments)   myalgias   Prevacid [lansoprazole] Other (See Comments)   Worsened GI side effects   Prevnar [pneumococcal 13-val Conj Vacc] Other (See Comments)   Egg allergy Bad reaction after shot   Rosuvastatin Other (See Comments)   Was not effective controlling lipids   Simvastatin Other (See Comments)   Leg cramps   Tape Other (See Comments), Rash   All adhesives  - Blisters, itching and burning blistering   Metaxalone    Tramadol    Leg cramps   Antihistamines, Chlorpheniramine-type Other (See Comments)   Alters vision   Betadine [povidone Iodine] Rash   Per pt.    Onion Diarrhea, Other (See Comments)   GI distress, flared IBS   Other Hives, Rash   NOTE: pt is able to take cephalosporins without reaction   Penicillins Other (See Comments)   CHILDHOOD ALLEGY/REACTION: "told 50 years ago I couldn't take it; might be immune to it", able to take amoxicillin Has patient had a PCN reaction causing immediate rash, facial/tongue/throat swelling, SOB or lightheadedness with hypotension: Unknown Has patient had a PCN reaction causing severe rash involving mucus membranes or skin necrosis: UnknowN Has patient had a PCN reaction that required hospitalization: /Unknown Has patient had a PCN reaction occurring within the last 10 years: Unknown If a   Red Dye Nausea Only, Swelling   Sertraline Other (See Comments)   Overly sedating   Sulfasalazine Other (See Comments)   "makes my whole body smell like sulfa & makes me sick just smelling it"        Medication List     STOP taking these medications    levofloxacin 750 MG tablet Commonly known as: Levaquin       TAKE these medications    acetaminophen 500 MG tablet Commonly known as: TYLENOL Take 500 mg by mouth daily as needed for moderate pain or headache (PAIN).   AMBULATORY NON  FORMULARY MEDICATION Medication Name:  1 squatty potty to use as needed   aspirin EC 81 MG tablet Take 81 mg by mouth at bedtime.   benzonatate 200 MG capsule Commonly known as: TESSALON Take 1 capsule (200 mg total) by mouth 3 (three) times daily as needed for cough.   BIOFREEZE ROLL-ON EX Apply topically.   Biotin 10 MG Caps Take 1 tablet by mouth daily.   carvedilol 6.25 MG tablet Commonly known as: COREG TAKE 0.5 TABLETS (3.125 MG TOTAL) BY MOUTH 2 (TWO) TIMES DAILY WITH A MEAL.   cholecalciferol 1000 units tablet Commonly known as: VITAMIN D Take 1,000 Units by mouth daily.   CoQ-10 100 MG Caps Take 100 mg by mouth daily.   dextromethorphan 30 MG/5ML liquid Commonly known as: DELSYM Take 15 mg by mouth daily as needed for cough.   Entresto 49-51 MG Generic drug: sacubitril-valsartan Take 1 tablet by mouth daily.   ezetimibe 10 MG tablet Commonly known as: ZETIA Take 1 tablet (  10 mg total) by mouth daily.   Fish Oil 1000 MG Caps Take 1 capsule (1,000 mg total) by mouth daily. What changed: how much to take   fluorouracil 5 % cream Commonly known as: EFUDEX Apply topically.   gabapentin 100 MG capsule Commonly known as: NEURONTIN Take 1 capsule (100 mg total) by mouth 2 (two) times daily. Take one nightly for the first 4 days   GARLIC PO Take 1 capsule by mouth daily.   guaiFENesin 600 MG 12 hr tablet Commonly known as: MUCINEX Take 600 mg by mouth 2 (two) times daily.   levalbuterol 45 MCG/ACT inhaler Commonly known as: Xopenex HFA Inhale 2 puffs into the lungs every 6 (six) hours as needed for wheezing or shortness of breath.   LORazepam 0.5 MG tablet Commonly known as: ATIVAN Take 0.5-1 tablets (0.25-0.5 mg total) by mouth at bedtime as needed for anxiety or sleep.   magnesium 30 MG tablet Take 30 mg by mouth daily.   miconazole 2 % powder Commonly known as: MICOTIN Apply 1 application topically as needed (yeast).   nirmatrelvir/ritonavir  EUA (renal dosing) Tabs Commonly known as: PAXLOVID Take 2 tablets by mouth 2 (two) times daily for 5 days. (Take nirmatrelvir 150 mg one tablet twice daily for 5 days and ritonavir 100 mg one tablet twice daily for 5 days) Patient GFR is 60   nystatin 100000 UNIT/ML suspension Commonly known as: MYCOSTATIN TAKE 5 MLS (500,000 UNITS TOTAL) BY MOUTH 3 (THREE) TIMES DAILY.   nystatin cream Commonly known as: MYCOSTATIN Apply 1 application topically as needed for dry skin (yeast infection).   pantoprazole 40 MG tablet Commonly known as: PROTONIX Take 1 tablet (40 mg total) by mouth every Monday, Wednesday, and Friday.   potassium chloride 10 MEQ tablet Commonly known as: KLOR-CON Take 10 mEq by mouth 3 (three) times a week.   pyridOXINE 100 MG tablet Commonly known as: VITAMIN B-6 Take 100 mg by mouth daily.   Qvar RediHaler 80 MCG/ACT inhaler Generic drug: beclomethasone Inhale 1 puff into the lungs 2 (two) times daily.   RED YEAST RICE PO Take 1 capsule by mouth in the morning and at bedtime.   spironolactone 25 MG tablet Commonly known as: ALDACTONE TAKE 1 TABLET BY MOUTH EVERY DAY   torsemide 10 MG tablet Commonly known as: DEMADEX Take 10 mg by mouth 3 (three) times a week.   triamcinolone 55 MCG/ACT Aero nasal inhaler Commonly known as: NASACORT Place into the nose.   vitamin C 1000 MG tablet Take 1,000 mg by mouth See admin instructions. Take 1000 mg daily, may take an additional 1000 mg as needed for cold symptoms   vitamin E 180 MG (400 UNITS) capsule Take 400 Units by mouth daily.        Follow-up Information     [name], MD Follow up.   Specialty: Family Medicine Contact information: [place] [address] [phone]                Allergies  Allergen Reactions   Atorvastatin Other (See Comments)    Severe muscle cramps to generic atorvastatin.  Able to take brand lipitor.   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    Influenza Vac Split [Influenza Virus Vaccine] Other (See Comments)    "allergic to concentrated eggs that are in vaccine"   Latex Nausea Only, Rash and Other (See Comments)    Pain and infection   Morphine And Related Other (See Comments)    Internally feels like she is on fire    Pravastatin Other (See Comments)    myalgias   Prevacid [Lansoprazole] Other (See Comments)    Worsened GI side effects   Prevnar [Pneumococcal 13-Val Conj Vacc] Other (See Comments)    Egg allergy Bad reaction after shot   Rosuvastatin Other (See Comments)    Was not effective controlling lipids   Simvastatin Other (See Comments)    Leg cramps   Tape Other (See Comments) and Rash    All adhesives  - Blisters, itching and burning  blistering   Metaxalone    Tramadol     Leg cramps   Antihistamines, Chlorpheniramine-Type Other (See Comments)    Alters vision   Betadine [Povidone Iodine] Rash    Per pt.    Onion Diarrhea and Other (See Comments)    GI distress, flared IBS   Other Hives and Rash    NOTE: pt is able to take cephalosporins without reaction   Penicillins Other (See Comments)    CHILDHOOD ALLEGY/REACTION: "told 50 years ago I couldn't take it; might be immune to it", able to take amoxicillin Has patient had a PCN reaction causing immediate rash, facial/tongue/throat swelling, SOB or lightheadedness with hypotension: Unknown Has patient had a  PCN reaction causing severe rash involving mucus membranes or skin necrosis: UnknowN Has patient had a PCN reaction that required hospitalization: /Unknown Has patient had a PCN reaction occurring within the last 10 years: Unknown If a   Red Dye Nausea Only and Swelling   Sertraline Other (See Comments)    Overly sedating   Sulfasalazine Other (See Comments)    "makes my whole body smell like sulfa & makes me sick just smelling it"    Consultations: None  Procedures/Studies: CT Head Wo Contrast  Result Date: 12/02/2020 CLINICAL DATA:  Syncope. EXAM: CT HEAD WITHOUT CONTRAST TECHNIQUE: Contiguous axial images were obtained from the base of the skull through the vertex without intravenous contrast. COMPARISON:  09/29/2016 FINDINGS: Brain: There is no evidence for acute hemorrhage, hydrocephalus, mass lesion, or abnormal extra-axial fluid collection. No definite CT evidence for acute infarction. Vascular: No hyperdense vessel or unexpected calcification. Skull: No evidence for fracture. No worrisome lytic or sclerotic lesion. Sinuses/Orbits: The visualized paranasal sinuses and mastoid air cells are clear. Visualized portions of the globes and intraorbital fat are unremarkable. Other: None. IMPRESSION: Stable.  No acute intracranial abnormality. Electronically Signed   By: Misty Stanley M.D.   On: 12/02/2020 06:12   CT Angio Chest PE W/Cm &/Or Wo Cm  Result Date: 12/02/2020 CLINICAL DATA:  Syncope at home.  COVID positive.  Positive D-dimer. EXAM: CT ANGIOGRAPHY CHEST WITH CONTRAST TECHNIQUE: Multidetector CT imaging of the chest was performed using the standard protocol during bolus administration of intravenous contrast. Multiplanar CT image reconstructions and MIPs were obtained to evaluate the vascular anatomy.  CONTRAST:  44m OMNIPAQUE IOHEXOL 350 MG/ML SOLN COMPARISON:  04/28/2011 FINDINGS: Cardiovascular: Dual-chamber pacer leads into the right heart. Prominent left ventricular size. No  generalized cardiomegaly or pericardial effusion. Satisfactory opacification of the pulmonary arteries with no filling defect seen. Mediastinum/Nodes: Negative for adenopathy or mass Lungs/Pleura: Bronchomalacia with intermittent airway collapse. Only minor atelectasis however. Trace pleural fluid on the right. No consolidation or pneumothorax. Upper Abdomen: No acute finding Musculoskeletal: No acute finding. Remote and healed posterior right rib fractures. Review of the MIP images confirms the above findings. IMPRESSION: Negative for pulmonary embolism or other acute finding. Aortic Atherosclerosis (ICD10-I70.0). Electronically Signed   By: JMonte FantasiaM.D.   On: 12/02/2020 06:20   DG Chest Port 1 View  Result Date: 12/02/2020 CLINICAL DATA:  Shortness of breath EXAM: PORTABLE CHEST 1 VIEW COMPARISON:  12/10/2018 FINDINGS: Lungs are clear.  No pleural effusion or pneumothorax. The heart is normal in size.  Left subclavian ICD. IMPRESSION: No evidence of acute cardiopulmonary disease. Electronically Signed   By: SJulian HyM.D.   On: 12/02/2020 03:09   CUP PACEART REMOTE DEVICE CHECK  Result Date: 11/09/2020 Scheduled remote reviewed. Normal device function.  Multiple alerts for SVT, rates 136-143, all but two <122m.  Longest duration 49m69m6sec Next remote 91 days.     Subjective: Feels well, no dyspnea. No diarrhea. Wants to go home.   Discharge Exam: Vitals:   12/04/20 0752 12/04/20 0845  BP: 106/66 138/71  Pulse: 80 73  Resp: 18 16  Temp: 98.3 F (36.8 C) 98.4 F (36.9 C)  SpO2: 94% 96%   General: Pt is alert, awake, not in acute distress Cardiovascular: RRR, S1/S2 +, no rubs, no gallops Respiratory: CTA bilaterally, no wheezing, no rhonchi Abdominal: Soft, NT, ND, bowel sounds + Extremities:  No edema, no cyanosis  Labs: BNP (last 3 results) Recent Labs    10/29/20 1157 12/02/20 0229  BNP 23.2 15.0000000Basic Metabolic Panel: Recent Labs  Lab 12/02/20 0229  12/03/20 0648  NA 134* 137  K 3.5 4.1  CL 104 103  CO2 20* 23  GLUCOSE 196* 172*  BUN 15 18  CREATININE 1.07* 0.87  CALCIUM 9.3 9.7  MG  --  2.1  PHOS  --  2.7   Liver Function Tests: Recent Labs  Lab 12/02/20 0229 12/03/20 0648  AST 41 37  ALT 41 41  ALKPHOS 84 84  BILITOT 0.6 0.5  PROT 7.6 8.1  ALBUMIN 4.0 3.9   No results for input(s): LIPASE, AMYLASE in the last 168 hours. No results for input(s): AMMONIA in the last 168 hours. CBC: Recent Labs  Lab 12/02/20 0229 12/03/20 0648 12/04/20 0522  WBC 5.2 9.0 13.7*  NEUTROABS 3.1 6.5 10.7*  HGB 13.2 13.5 14.4  HCT 41.0 40.0 43.3  MCV 92.8 90.3 89.5  PLT 150 129* 226   Cardiac Enzymes: No results for input(s): CKTOTAL, CKMB, CKMBINDEX, TROPONINI in the last 168 hours. BNP: Invalid input(s): POCBNP CBG: No results for input(s): GLUCAP in the last 168 hours. D-Dimer Recent Labs    12/02/20 0242 12/03/20 0648  DDIMER 2.53* 2.23*   Hgb A1c No results for input(s): HGBA1C in the last 72 hours. Lipid Profile No results for input(s): CHOL, HDL, LDLCALC, TRIG, CHOLHDL, LDLDIRECT in the last 72 hours. Thyroid function studies No results for input(s): TSH, T4TOTAL, T3FREE, THYROIDAB in the last 72 hours.  Invalid input(s): FREET3 Anemia work up [employer] 12/02/20 [id number] [id number]  FERRITIN 245 346*   Urinalysis    Component Value Date/Time   COLORURINE YELLOW 09/01/2019 0124   APPEARANCEUR HAZY (A) 09/01/2019 0124   LABSPEC 1.020 09/01/2019 0124   PHURINE 5.0 09/01/2019 0124   GLUCOSEU NEGATIVE 09/01/2019 0124   HGBUR NEGATIVE 09/01/2019 0124   BILIRUBINUR negative 10/26/2020 1646   KETONESUR NEGATIVE 09/01/2019 0124   PROTEINUR Negative 10/26/2020 1646   PROTEINUR NEGATIVE 09/01/2019 0124   UROBILINOGEN 0.2 10/26/2020 1646   UROBILINOGEN 0.2 05/27/2014 1952   NITRITE negative 10/26/2020 1646   NITRITE NEGATIVE 09/01/2019 0124   LEUKOCYTESUR Negative 10/26/2020 1646   LEUKOCYTESUR  MODERATE (A) 09/01/2019 0124    Microbiology Recent Results (from the past 240 hour(s))  Culture, blood (routine x 2)     Status: None (Preliminary result)   Collection Time: 12/02/20  2:34 AM   Specimen: BLOOD RIGHT FOREARM  Result Value Ref Range Status   Specimen Description BLOOD RIGHT FOREARM  Final   Special Requests   Final    BOTTLES DRAWN AEROBIC AND ANAEROBIC Blood Culture adequate volume   Culture   Final    NO GROWTH 2 DAYS Performed at [facility], [address] [place], [place] [address]    Report Status PENDING  Incomplete  Culture, blood (routine x 2)     Status: None (Preliminary result)   Collection Time: 12/02/20  2:39 AM   Specimen: Right Antecubital; Blood  Result Value Ref Range Status   Specimen Description RIGHT ANTECUBITAL  Final   Special Requests   Final    BOTTLES DRAWN AEROBIC AND ANAEROBIC Blood Culture adequate volume   Culture   Final    NO GROWTH 2 DAYS Performed at [facility], [address] [place], [place] [address]    Report Status PENDING  Incomplete    Time coordinating discharge: Approximately 40 minutes  Patrecia Pour, MD  [facility] 12/04/2020, 8:45 AM

## 2020-12-04 NOTE — Progress Notes (Signed)
Patient alert and oriented x 4. No complaints of pain. No change in previous assessment. Patient being discharged home with no needs. Home meds returned to patient prior to discharge.

## 2020-12-06 ENCOUNTER — Encounter: Payer: Self-pay | Admitting: Family Medicine

## 2020-12-06 ENCOUNTER — Telehealth: Payer: Self-pay

## 2020-12-06 DIAGNOSIS — U071 COVID-19: Secondary | ICD-10-CM

## 2020-12-06 HISTORY — DX: COVID-19: U07.1

## 2020-12-06 NOTE — Telephone Encounter (Signed)
.  Transition Care Management Follow-up Telephone Call Date of discharge and from where: 12/04/2020, [facility]  How have you been since you were released from the hospital? Patient is doing better.  Any questions or concerns? No  Items Reviewed: Did the pt receive and understand the discharge instructions provided? Yes  Medications obtained and verified? Yes  Other? No  Any new allergies since your discharge? No  Dietary orders reviewed? Yes Do you have support at home? Yes   Home Care and Equipment/Supplies: Were home health services ordered? not applicable If so, what is the name of the agency? N/A  Has the agency set up a time to come to the patient's home? not applicable Were any new equipment or medical supplies ordered?  No What is the name of the medical supply agency? N/A Were you able to get the supplies/equipment? not applicable Do you have any questions related to the use of the equipment or supplies? No  Functional Questionnaire: (I = Independent and D = Dependent) ADLs: I  Bathing/Dressing- I  Meal Prep- I  Eating- I  Maintaining continence- I  Transferring/Ambulation- I  Managing Meds- I  Follow up appointments reviewed:  PCP Hospital f/u appt confirmed? Yes  Scheduled to see Dr. Danise Mina on 12/13/2020 @ 11 am. [facility] f/u appt confirmed? Yes  Scheduled to see cardiology on 01/05/2021. Are transportation arrangements needed? No  If their condition worsens, is the pt aware to call PCP or go to the Emergency Dept.? Yes Was the patient provided with contact information for the PCP's office or ED? Yes Was to pt encouraged to call back with questions or concerns? Yes

## 2020-12-07 ENCOUNTER — Telehealth: Payer: Self-pay | Admitting: Family Medicine

## 2020-12-07 DIAGNOSIS — F419 Anxiety disorder, unspecified: Secondary | ICD-10-CM | POA: Diagnosis not present

## 2020-12-07 DIAGNOSIS — Z7982 Long term (current) use of aspirin: Secondary | ICD-10-CM | POA: Diagnosis not present

## 2020-12-07 DIAGNOSIS — J449 Chronic obstructive pulmonary disease, unspecified: Secondary | ICD-10-CM | POA: Diagnosis not present

## 2020-12-07 DIAGNOSIS — Z950 Presence of cardiac pacemaker: Secondary | ICD-10-CM | POA: Diagnosis not present

## 2020-12-07 DIAGNOSIS — I495 Sick sinus syndrome: Secondary | ICD-10-CM | POA: Diagnosis not present

## 2020-12-07 DIAGNOSIS — Z9181 History of falling: Secondary | ICD-10-CM | POA: Diagnosis not present

## 2020-12-07 DIAGNOSIS — K219 Gastro-esophageal reflux disease without esophagitis: Secondary | ICD-10-CM | POA: Diagnosis not present

## 2020-12-07 DIAGNOSIS — I5022 Chronic systolic (congestive) heart failure: Secondary | ICD-10-CM | POA: Diagnosis not present

## 2020-12-07 DIAGNOSIS — E785 Hyperlipidemia, unspecified: Secondary | ICD-10-CM | POA: Diagnosis not present

## 2020-12-07 DIAGNOSIS — E669 Obesity, unspecified: Secondary | ICD-10-CM | POA: Diagnosis not present

## 2020-12-07 DIAGNOSIS — Z6830 Body mass index (BMI) 30.0-30.9, adult: Secondary | ICD-10-CM | POA: Diagnosis not present

## 2020-12-07 DIAGNOSIS — U071 COVID-19: Secondary | ICD-10-CM | POA: Diagnosis not present

## 2020-12-07 DIAGNOSIS — J9601 Acute respiratory failure with hypoxia: Secondary | ICD-10-CM | POA: Diagnosis not present

## 2020-12-07 DIAGNOSIS — A0839 Other viral enteritis: Secondary | ICD-10-CM | POA: Diagnosis not present

## 2020-12-07 DIAGNOSIS — I420 Dilated cardiomyopathy: Secondary | ICD-10-CM | POA: Diagnosis not present

## 2020-12-07 DIAGNOSIS — N183 Chronic kidney disease, stage 3 unspecified: Secondary | ICD-10-CM | POA: Diagnosis not present

## 2020-12-07 LAB — CULTURE, BLOOD (ROUTINE X 2)
Culture: NO GROWTH
Culture: NO GROWTH
Special Requests: ADEQUATE
Special Requests: ADEQUATE

## 2020-12-07 NOTE — Telephone Encounter (Signed)
Home Health verbal orders-caller/Agency: [facility]  Callback number: [phone]  Requesting OT/PT/Skilled nursing/Social Work/Speech: nursing  Reason: recovering from covid  Frequency: 2w1 1w8

## 2020-12-07 NOTE — Telephone Encounter (Signed)
Agree with this thank you.  

## 2020-12-07 NOTE — Telephone Encounter (Signed)
Hospitalization in interim. F/u OV planned

## 2020-12-08 ENCOUNTER — Telehealth: Payer: Self-pay

## 2020-12-08 ENCOUNTER — Encounter: Payer: Self-pay | Admitting: Family Medicine

## 2020-12-08 ENCOUNTER — Ambulatory Visit (INDEPENDENT_AMBULATORY_CARE_PROVIDER_SITE_OTHER): Payer: Medicare Other | Admitting: Family Medicine

## 2020-12-08 ENCOUNTER — Other Ambulatory Visit: Payer: Self-pay

## 2020-12-08 VITALS — BP 126/66 | HR 81 | Temp 97.7°F | Ht 65.0 in | Wt 180.3 lb

## 2020-12-08 DIAGNOSIS — J9600 Acute respiratory failure, unspecified whether with hypoxia or hypercapnia: Secondary | ICD-10-CM

## 2020-12-08 DIAGNOSIS — Z8616 Personal history of COVID-19: Secondary | ICD-10-CM | POA: Diagnosis not present

## 2020-12-08 DIAGNOSIS — N1831 Chronic kidney disease, stage 3a: Secondary | ICD-10-CM | POA: Diagnosis not present

## 2020-12-08 DIAGNOSIS — I420 Dilated cardiomyopathy: Secondary | ICD-10-CM

## 2020-12-08 DIAGNOSIS — I5022 Chronic systolic (congestive) heart failure: Secondary | ICD-10-CM | POA: Diagnosis not present

## 2020-12-08 DIAGNOSIS — R0902 Hypoxemia: Secondary | ICD-10-CM

## 2020-12-08 DIAGNOSIS — A0839 Other viral enteritis: Secondary | ICD-10-CM

## 2020-12-08 DIAGNOSIS — U071 COVID-19: Secondary | ICD-10-CM

## 2020-12-08 NOTE — Telephone Encounter (Signed)
Pt called asking where the referral for the Oxygen was sent as she had not heard form anyone. She called a local number for [facility] and they did not have an order. I gave her the [phone] number.

## 2020-12-08 NOTE — Patient Instructions (Addendum)
I will see if we can get you set up with home oxygen for night time (through [facility]). We may need to do overnight oxygen test.  Overall I think you're recovering well from [facility].  Keep [place] updated with how you're doing.

## 2020-12-08 NOTE — Telephone Encounter (Addendum)
There's no way to make this stat from my end but I did want her to get this tonight. Please follow up on this tomorrow.

## 2020-12-08 NOTE — Telephone Encounter (Signed)
Spoke with pt earlier informing her the order was placed and the appropriate parties have been informed. (See earlier phn note)  I do not see that O2 order was STAT.

## 2020-12-08 NOTE — Telephone Encounter (Signed)
Spoke pt notifying her Dr. Darnell Level placed the order and I sent a message to [facility].  Informed pt she should get a call to schedule a delivery.  Pt verbalizes understanding a expresses her thanks.

## 2020-12-08 NOTE — Telephone Encounter (Signed)
Pt already has appt to see Dr Darnell Level today at 10:30. See card note. Also sending teams to Dr Darnell Level and Lattie Haw CMA.

## 2020-12-08 NOTE — Telephone Encounter (Signed)
Pt reports diagnosed with Covid over a week ago, she was hospitalized last week due to dehydration.  She has been home since Saturday and is concerned with her SPO2 levels.  Pt sent a remote transmission from her CRT-D.  Device shows normal functions, no arrhythmias.  Noted that optivol level rising with thoracic impedence trending down, however not at threshold.  Stressed to patient she needs to watch her sodium intake and make sure she is taking meds including Torsemide as ordered.    Pt reports she feels fine today she is just concerned that her oxygen levels are going down.  Advised pt to speak with PCP.

## 2020-12-08 NOTE — Telephone Encounter (Signed)
Patient called in stating she needs to be seen today. She states she has just gotten out of the hospital and she states her monitor keeps going off about 5 times and she states its from her being in the hospital and in heart failure. Patient has sent a manual transmission to see if it shows anything.

## 2020-12-08 NOTE — Telephone Encounter (Signed)
Spoke with Cecille Rubin informing her Dr. Darnell Level is giving verbal orders for services requested.

## 2020-12-08 NOTE — Telephone Encounter (Signed)
I sent another [facility] asking [facility] to confirm they have the order (which they usually do).

## 2020-12-08 NOTE — Telephone Encounter (Signed)
New message   Patient is asking for a call back - regarding oxygen at the home

## 2020-12-08 NOTE — Progress Notes (Signed)
Patient ID: Ashley [name], female    DOB: 06-22-1945, 75 y.o.   MRN: [id number]  This visit was conducted in person.  BP 126/66   Pulse 81   Temp 97.7 F (36.5 C) (Temporal)   Ht '5\' 5"'$  (1.651 m)   Wt 180 lb 5 oz (81.8 kg)   SpO2 96%   BMI 30.01 kg/m    CC: hosp f/u visit  Subjective:   HPI: Ashley [name] is a 75 y.o. female presenting on 12/08/2020 for Hospitalization Follow-up (Admitted on 12/02/20 to [facility], dx generalized weakness.  C/o pacemaker alarm going off 5x last night and SpO2 was up and down, 87%-97%.  Has spoken with cards, told to contact PCP. )   Recent hospitalization for acute hypoxic respiratory failure from COVID after starting paxlovid. CTA negative for pneumonia. Paxlovid caused watery diarrhea and may have caused dehydration with hypotension - which led to ER eval.   Pacemaker went off 5 times last night. She called cards clinic - they checked ICD which returned ok (normal function, no arrhythmias). Likely low oxygen setting off alarm. This happened overnight - dropped to 86% however unsure how long O2 levels staying low. Noted optival level rising however not at threshold. She had some cardiac meds held during hospitalization - spironolactone and carvedilol and torsemide. She already took potassium and torsemide this morning.   Since home some watery diarrhea earlier this week, today becoming more formed.   No fevers/chills. Mild cough present. No dyspnea or wheezing, chest pain.    Admit date: 12/02/2020 Discharge date: 12/04/2020  TCM hosp f/u phone call completed on 12/06/2020  Admitted From: Home Disposition: Home    Recommendations for Outpatient Follow-up:  Follow up with PCP in 1-2 weeks   Discharge Diagnoses:  Principal Problem:   Near syncope Active Problems:   Chronic systolic CHF (congestive heart failure) (HCC)   CKD (chronic kidney disease) stage 3, GFR 30-59 ml/min (HCC)   GERD (gastroesophageal reflux disease)    Gastroenteritis due to COVID-19 virus   Acute respiratory failure (HCC)   Acute respiratory failure due to COVID-19 [facility])  Home Health: None Equipment/Devices: None Discharge Condition: Stable CODE STATUS: Full Diet recommendation: Heart healthy     Relevant past medical, surgical, family and social history reviewed and updated as indicated. Interim medical history since our last visit reviewed. Allergies and medications reviewed and updated. Outpatient Medications Prior to Visit  Medication Sig Dispense Refill   acetaminophen (TYLENOL) 500 MG tablet Take 500 mg by mouth daily as needed for moderate pain or headache (PAIN).      AMBULATORY NON FORMULARY MEDICATION Medication Name:  1 squatty potty to use as needed 1 each 0   Ascorbic Acid (VITAMIN C) 1000 MG tablet Take 1,000 mg by mouth See admin instructions. Take 1000 mg daily, may take an additional 1000 mg as needed for cold symptoms     aspirin EC 81 MG tablet Take 81 mg by mouth at bedtime.      beclomethasone (QVAR REDIHALER) 80 MCG/ACT inhaler Inhale 1 puff into the lungs 2 (two) times daily. 1 each 2   benzonatate (TESSALON) 200 MG capsule Take 1 capsule (200 mg total) by mouth 3 (three) times daily as needed for cough. 30 capsule 6   Biotin 10 MG CAPS Take 1 tablet by mouth daily.     carvedilol (COREG) 6.25 MG tablet TAKE 0.5 TABLETS (3.125 MG TOTAL) BY MOUTH 2 (TWO) TIMES DAILY WITH A MEAL. [place]  tablet 0   cholecalciferol (VITAMIN D) 1000 units tablet Take 1,000 Units by mouth daily.      Coenzyme Q10 (COQ-10) 100 MG CAPS Take 100 mg by mouth daily.     dextromethorphan (DELSYM) 30 MG/5ML liquid Take 15 mg by mouth daily as needed for cough.      ezetimibe (ZETIA) 10 MG tablet Take 1 tablet (10 mg total) by mouth daily. 90 tablet 3   fluorouracil (EFUDEX) 5 % cream Apply topically.     gabapentin (NEURONTIN) 100 MG capsule Take 1 capsule (100 mg total) by mouth 2 (two) times daily. Take one nightly for the first 4 days 60  capsule 3   GARLIC PO Take 1 capsule by mouth daily.     guaiFENesin (MUCINEX) 600 MG 12 hr tablet Take 600 mg by mouth 2 (two) times daily.     levalbuterol (XOPENEX HFA) 45 MCG/ACT inhaler Inhale 2 puffs into the lungs every 6 (six) hours as needed for wheezing or shortness of breath. 15 g 3   LORazepam (ATIVAN) 0.5 MG tablet Take 0.5-1 tablets (0.25-0.5 mg total) by mouth at bedtime as needed for anxiety or sleep. 30 tablet 1   magnesium 30 MG tablet Take 30 mg by mouth daily.     Menthol, Topical Analgesic, (BIOFREEZE ROLL-ON EX) Apply topically.     miconazole (MICOTIN) 2 % powder Apply 1 application topically as needed (yeast).     nystatin (MYCOSTATIN) 100000 UNIT/ML suspension TAKE 5 MLS (500,000 UNITS TOTAL) BY MOUTH 3 (THREE) TIMES DAILY. 120 mL 1   nystatin cream (MYCOSTATIN) Apply 1 application topically as needed for dry skin (yeast infection). 30 g 3   Omega-3 Fatty Acids (FISH OIL) 1000 MG CAPS Take 1 capsule (1,000 mg total) by mouth daily.  0   pantoprazole (PROTONIX) 40 MG tablet Take 1 tablet (40 mg total) by mouth every Monday, Wednesday, and Friday.     potassium chloride (KLOR-CON) 10 MEQ tablet Take 10 mEq by mouth 3 (three) times a week.     pyridOXINE (VITAMIN B-6) 100 MG tablet Take 100 mg by mouth daily.     Red Yeast Rice Extract (RED YEAST RICE PO) Take 1 capsule by mouth in the morning and at bedtime.     sacubitril-valsartan (ENTRESTO) 49-51 MG Take 1 tablet by mouth daily.     spironolactone (ALDACTONE) 25 MG tablet TAKE 1 TABLET BY MOUTH EVERY DAY 90 tablet 1   torsemide (DEMADEX) 10 MG tablet Take 10 mg by mouth 3 (three) times a week.     triamcinolone (NASACORT) 55 MCG/ACT AERO nasal inhaler Place into the nose.     vitamin E 400 UNIT capsule Take 400 Units by mouth daily.     No facility-administered medications prior to visit.     Per HPI unless specifically indicated in ROS section below Review of Systems  Objective:  BP 126/66   Pulse 81   Temp  97.7 F (36.5 C) (Temporal)   Ht '5\' 5"'$  (1.651 m)   Wt 180 lb 5 oz (81.8 kg)   SpO2 96%   BMI 30.01 kg/m   Wt Readings from Last 3 Encounters:  12/08/20 180 lb 5 oz (81.8 kg)  12/03/20 181 lb 7 oz (82.3 kg)  11/29/20 184 lb 7 oz (83.7 kg)      Physical Exam Vitals and nursing note reviewed.  Constitutional:      Appearance: Normal appearance. She is not ill-appearing.  Cardiovascular:     Rate  and Rhythm: Normal rate and regular rhythm.     Pulses: Normal pulses.     Heart sounds: Normal heart sounds. No murmur heard. Pulmonary:     Effort: Pulmonary effort is normal. No respiratory distress.     Breath sounds: Normal breath sounds. No wheezing, rhonchi or rales.  Musculoskeletal:     Right lower leg: No edema.     Left lower leg: No edema.  Skin:    General: Skin is warm and dry.     Findings: No rash.  Neurological:     Mental Status: She is alert.  Psychiatric:        Mood and Affect: Mood normal.        Behavior: Behavior normal.      Lab Results  Component Value Date   CREATININE 0.87 12/03/2020   BUN 18 12/03/2020   NA 137 12/03/2020   K 4.1 12/03/2020   CL 103 12/03/2020   CO2 23 12/03/2020    Lab Results  Component Value Date   WBC 13.7 (H) 12/04/2020   HGB 14.4 12/04/2020   HCT 43.3 12/04/2020   MCV 89.5 12/04/2020   PLT 226 12/04/2020    Assessment & Plan:  This visit occurred during the SARS-CoV-2 public health emergency.  Safety protocols were in place, including screening questions prior to the visit, additional usage of staff PPE, and extensive cleaning of exam room while observing appropriate contact time as indicated for disinfecting solutions.   Problem List Items Addressed This Visit     Chronic systolic CHF (congestive heart failure) (HCC)    There was some concern for developing hypervolemia - she was off torsemide and other cadriac medications while in hospital. She has since restarted torsemide MWF. She seems euvolemic today.         Cardiomyopathy, dilated, nonischemic (HCC)   CKD (chronic kidney disease) stage 3, GFR 30-59 ml/min (HCC)    Recent GFR >60.        Gastroenteritis due to COVID-19 virus    Dx COVID gastroenteritis, but possibly just side effect of Paxlovid. Seems to be improving.        Acute respiratory failure due to COVID-19 [facility]) - Primary    Recovering well after recent COVID illness treated with Paxlovid - pulmonary symptoms were never severe however she notes concern for ongoing nocturnal hypoxia as evidenced by ICD alarm going off multiple times overnight and noted pulse ox reading down to 86%. I agree we should offer support with nocturnal oxygen and I have ordered this through her home health agency. Discussed we may need to complete overnight oximetry test but she should qualify based on low reading overnight to 86%.        Other Visit Diagnoses     Hypoxia       Relevant Orders   For home use only DME oxygen        No orders of the defined types were placed in this encounter.  Orders Placed This Encounter  Procedures   For home use only DME oxygen    Order Specific Question:   Length of Need    Answer:   6 Months    Order Specific Question:   Mode or (Route)    Answer:   Nasal cannula    Order Specific Question:   Liters per Minute    Answer:   2    Order Specific Question:   Frequency    Answer:   Only at night (stationary  unit needed)    Order Specific Question:   Oxygen conserving device    Answer:   No    Order Specific Question:   Oxygen delivery system    Answer:   Gas     Patient Instructions  I will see if we can get you set up with home oxygen for night time (through [facility]). We may need to do overnight oxygen test.  Overall I think you're recovering well from [place].  Keep [place] updated with how you're doing.   Follow up plan: Return if symptoms worsen or fail to improve.  [name], MD

## 2020-12-08 NOTE — Telephone Encounter (Signed)
[place] Night - Client TELEPHONE ADVICE RECORD [facility] Patient Name: Ashley [name] Gender: Female DOB: 05/03/1946 Age: 75 Y 31 M 21 D Return Phone Number: [phone] (Primary) Address: City/ State/ Zip: [place] [address] Client [facility] Night - Client Client Site [place] Physician [name] - MD Contact Type Call Who Is Calling Patient / Member / Family / Caregiver Call Type Triage / Clinical Relationship To Patient Self Return Phone Number [phone] (Primary) Chief Complaint Pacemaker or AICD Problems Reason for Call Request to Schedule Office Appointment Initial Comment Caller states she wants to leave a message for her Dr, she just finished taking paxlovid. Her pace maker alarm went off 5 times last night, her O2 has been running between 87-97. She has been up since 3am. Last week her pace maker went completely off, and was in the hospital for a few days. Ever since being home her O2 has been fluctuating. Wants her pace maker checked today. Current O2 is 93-94 but she feels fine, no SOB or chest pain, just a slight a head ache. Translation No Nurse Assessment Nurse: Fredderick Phenix, RN, Lelan Pons Date/Time [name] Time): 12/08/2020 7:29:28 AM Confirm and document reason for call. If symptomatic, describe symptoms. ---Caller states she just finished taking paxlovid. Her pace maker alarm went off 5 times last night, her O2 has been running between 87-97. She has been up since 3am. Last week her pace maker went completely off, and was in the hospital for a few days. Ever since being home her O2 has been fluctuating. Wants her pace maker checked today. Current O2 is 93-94 but she feels fine, no SOB or chest pain, just a slight a head ache. She was in the hospital last week for dehydration. Does the patient have any new or worsening symptoms? ---Yes Will a triage be  completed? ---Yes Related visit to physician within the last 2 weeks? ---Yes Does the PT have any chronic conditions? (i.e. diabetes, asthma, this includes High risk factors for pregnancy, etc.) ---Yes List chronic conditions. ---Cardiac hx Is this a behavioral health or substance abuse call? ---No PLEASE NOTE: All timestamps contained within this report are represented as [place] Standard Time. CONFIDENTIALTY NOTICE: This fax transmission is intended only for the addressee. It contains information that is legally privileged, confidential or otherwise protected from use or disclosure. If you are not the intended recipient, you are strictly prohibited from reviewing, disclosing, copying using or disseminating any of this information or taking any action in reliance on or regarding this information. If you have received this fax in error, please notify us immediately by telephone so that we can arrange for its return to [place]. Phone: [phone], Toll-Free: [phone], Fax: [phone] Page: 2 of 2 Call Id: [id number] Guidelines Guideline Title Affirmed Question Affirmed Notes Nurse Date/Time [name] Time) ICD and Pacemaker Symptoms and Questions [1] Heard or felt device ALARM (e.g., beeping or buzzing) more than once today (Exception: caused by a magnet being near the device) AND [2] feels unwell aftewards Fredderick Phenix, RN, Lelan Pons 12/08/2020 7:32:45 AM Disp. Time [name] Time) Disposition Final User 12/08/2020 7:42:12 AM Called On-Call Provider Fredderick Phenix, RN, Lelan Pons 12/08/2020 7:46:15 AM Call PCP Now Yes Fredderick Phenix, RN, [place] Disagree/Comply Comply Caller Understands Yes PreDisposition Call Doctor Care Advice Given Per Guideline CALL PCP NOW: * I'll page the on-call provider now. If you haven't heard from the provider (or me) within 30 minutes, call again. DEVICE ALARMS: *  ICDs have alarms to alert the patient and provider. The alarm is a beeping sound or a vibration. * An alarm does  not mean your device has stopped working. CALL BACK IF: * You become worse CARE ADVICE given per ICD and Pacemaker Symptoms and Questions (Adult) guideline. Paging DoctorName Phone DateTime Result/ Outcome Message Type Notes Thersa Salt- MD [id number] 12/08/2020 7:42:12 AM Called On Call Provider - Reached Doctor Paged Thersa Salt- MD 12/08/2020 7:45:54 AM [employer] Msg Received - Provider will Talk with Patient Message Result

## 2020-12-08 NOTE — Telephone Encounter (Signed)
Pt left VM on triage line asking about her order for O2. She is under the impression that Dr. Darnell Level was ordering it as STAT and they would be delivering it tonight. Pt states that her O2 sats have been dropping all afternoon and she is concerned about what it will be through the night. Please advise pt.

## 2020-12-09 NOTE — Assessment & Plan Note (Addendum)
There was some concern for developing hypervolemia - she was off torsemide and other cadriac medications while in hospital. She has since restarted torsemide MWF. She seems euvolemic today.

## 2020-12-09 NOTE — Telephone Encounter (Signed)
I received confirmation from [facility] that they received the order and are working on it for the pt.  Spoke with pt letting her know info above.  She verbalizes understanding, voiced her concern and disappointment for the delay but expresses her thanks for the update.

## 2020-12-09 NOTE — Assessment & Plan Note (Signed)
Dx COVID gastroenteritis, but possibly just side effect of Paxlovid. Seems to be improving.

## 2020-12-09 NOTE — Telephone Encounter (Signed)
Ashley [name] called in wanted to know about her oxygen delivery, she has been calling and no answer.

## 2020-12-09 NOTE — Assessment & Plan Note (Signed)
Recent GFR >60.

## 2020-12-09 NOTE — Assessment & Plan Note (Signed)
Recovering well after recent COVID illness treated with Paxlovid - pulmonary symptoms were never severe however she notes concern for ongoing nocturnal hypoxia as evidenced by ICD alarm going off multiple times overnight and noted pulse ox reading down to 86%. I agree we should offer support with nocturnal oxygen and I have ordered this through her home health agency. Discussed we may need to complete overnight oximetry test but she should qualify based on low reading overnight to 86%.

## 2020-12-09 NOTE — Telephone Encounter (Signed)
I spoke with pt; pt said she had asthma attack last night due to being anxious because did not get oxygen last night. Last night pulse ox was  95%. Pt used inhaler and that helped. Now pulse ox is 94%; pt said she feels a light H/A all the time. No SOB at all and pt said she is not having any tightness in chest.pt just waking up and will rest this morning until hears back from [facility]. Pt said since yesterday has had 4 soft BMs. No more spells of feeling like going to black out. UC & ED precautions given and pt voiced understanding. Pt will wait to hear from [facility]. Pt called [phone] and was told [facility] did not have order. Pt said that she does not want to get anxious today. Sending note to [place].

## 2020-12-09 NOTE — Telephone Encounter (Signed)
[place] Night - Client TELEPHONE ADVICE RECORD [facility] Patient Name: Ashley [name] Gender: Female DOB: June 10, 1945 Age: 75 Y 8 M 21 D Return Phone Number: [phone] (Primary) Address: City/ State/ Zip: [place] [address] Client [facility] Night - Client Client Site [place] Physician [name] - MD Contact Type Call Who Is Calling Patient / Member / Family / Caregiver Call Type Triage / Clinical Relationship To Patient Self Return Phone Number [phone] (Primary) Chief Complaint BREATHING - shortness of breath or sounds breathless Reason for Call Medication Question / Request Initial Comment Caller has a question about her order for Oxygen-- says it was not sent out yet--says her Pacemaker has alarmed her 5 times last night, says her Oxygen is fluctuating, currently at 91 but has been going to low 80's Translation No Nurse Assessment Nurse: Lavina Hamman, RN, Thomasena Edis Date/Time (Eastern Time): 12/08/2020 5:19:25 PM Confirm and document reason for call. If symptomatic, describe symptoms. ---Caller states she was recently diagnosed with COVID x1 week ago, finished Paxlovid on Saturday. Currently having problems with oxygen level being low. Was seen today in office for these symptoms and was prescribed oxygen. Does not feel short of breath or confused. Says last night oxygen was 86% last night. Current SPO2, 94% Does the patient have any new or worsening symptoms? ---Yes Will a triage be completed? ---Yes Related visit to physician within the last 2 weeks? ---No Does the PT have any chronic conditions? (i.e. diabetes, asthma, this includes High risk factors for pregnancy, etc.) ---Yes List chronic conditions. ---Hear failure, pacemaker, colon resection in 2015 Is this a behavioral health or substance abuse call? ---No Guidelines Guideline Title Affirmed Question  Affirmed Notes Nurse Date/Time (Eastern Time) COVID-19 - Diagnosed or Suspected Oxygen level (e.g., pulse oximetry) 91 to 94 percent Lavina Hamman, RN, Thomasena Edis 12/08/2020 5:25:22 PM PLEASE NOTE: All timestamps contained within this report are represented as [place] Standard Time. CONFIDENTIALTY NOTICE: This fax transmission is intended only for the addressee. It contains information that is legally privileged, confidential or otherwise protected from use or disclosure. If you are not the intended recipient, you are strictly prohibited from reviewing, disclosing, copying using or disseminating any of this information or taking any action in reliance on or regarding this information. If you have received this fax in error, please notify us immediately by telephone so that we can arrange for its return to [place]. Phone: [phone], Toll-Free: [phone], Fax: [phone] Page: 2 of 2 Call Id: [id number] [place]. Time [name] Time) Disposition Final User 12/08/2020 5:16:37 PM Send to Urgent Jethro Bolus 12/08/2020 5:35:41 PM Called On-Call Provider Lavina Hamman, RN, Thomasena Edis 12/08/2020 5:30:28 PM Call PCP Now Yes Lavina Hamman, RN, Juline Patch Disagree/Comply Comply Caller Understands Yes PreDisposition InappropriateToAsk Care Advice Given Per Guideline * You need to discuss this with your doctor (or NP/PA). CALL PCP NOW: CALL BACK IF: * You become worse CARE ADVICE given per COVID-19 - DIAGNOSED OR SUSPECTED (Adult) guideline. Comments User: Farrel Gobble, RN Date/Time [name] Time): 12/08/2020 5:40:47 PM Wants to be called to find out what is going on with oxygen level. Referrals GO TO FACILITY REFUSED Paging DoctorName Phone DateTime Result/ Outcome Message Type Notes Billey Gosling - MD [id number] 12/08/2020 5:35:41 PM Called On Call Provider - Reached Doctor Paged Billey Gosling - MD 12/08/2020 5:40:21 PM Spoke with On Call - General Message Result OCP states if oxygen keeps dropping into  mid-80's pt should go to ER to  be evaluated by a physician tonight.

## 2020-12-10 ENCOUNTER — Telehealth: Payer: Self-pay

## 2020-12-10 DIAGNOSIS — A0839 Other viral enteritis: Secondary | ICD-10-CM | POA: Diagnosis not present

## 2020-12-10 DIAGNOSIS — J449 Chronic obstructive pulmonary disease, unspecified: Secondary | ICD-10-CM | POA: Diagnosis not present

## 2020-12-10 DIAGNOSIS — N183 Chronic kidney disease, stage 3 unspecified: Secondary | ICD-10-CM | POA: Diagnosis not present

## 2020-12-10 DIAGNOSIS — J9600 Acute respiratory failure, unspecified whether with hypoxia or hypercapnia: Secondary | ICD-10-CM

## 2020-12-10 DIAGNOSIS — I5022 Chronic systolic (congestive) heart failure: Secondary | ICD-10-CM | POA: Diagnosis not present

## 2020-12-10 DIAGNOSIS — U071 COVID-19: Secondary | ICD-10-CM

## 2020-12-10 DIAGNOSIS — J9601 Acute respiratory failure with hypoxia: Secondary | ICD-10-CM | POA: Diagnosis not present

## 2020-12-10 NOTE — Telephone Encounter (Addendum)
Make sure she is getting an accurate reading with pulse oximeter.    If she is feeling worse, then to ER.   If she is using O2 currently and O2 is still <90, to ER.   If O2 sat increase to >92 with O2 use and she is feeling well o/w, then would continue with O2 use.  If any swelling/CP/SOB or increase in sx, then needs eval today/asap.    Thanks.

## 2020-12-10 NOTE — Telephone Encounter (Signed)
Pt left VM on triage line stating that she used her oxygen last night and it worked great. After taking oxygen off this morning, her O2 sat has been dropping to 87 and never goes above 92. Pt would like a call back.  Also, pt wanted to make [place] aware that the contact person at [facility] for our office is Stephannie Peters [phone].

## 2020-12-10 NOTE — Telephone Encounter (Signed)
Spoke with pt relaying Dr. Josefine Class message.  Pt verbalizes understanding.  States pulse ox is accurate.  [facility] nurse was out today and checked O2 sat also getting similar reading as pt (on and off O2).  Says nurse also noticed noticed O2 sat decrease (per pt, to 82%) when O2 was removed.  Also, says nurse mentioned hearing some wheezing in R lung.  States [facility] nurse suggests pt use O2 24 hrs daily and will inform Dr. Darnell Level (by phn or fax).  Pt says nurse informed her she may need continuous O2 for wks or mos after COVID and that COVID affects everyone differently.  Reminded pt, per Dr. Damita Dunnings, if O2 sat drops <90% on O2, she needs to go to ER.  Pt again verbalizes understanding.  FYI, to Dr. Darnell Level and Dr. Damita Dunnings.

## 2020-12-13 ENCOUNTER — Ambulatory Visit: Payer: Medicare Other | Admitting: Family Medicine

## 2020-12-13 NOTE — Telephone Encounter (Addendum)
Spoke with Ashley [name] relaying Dr. Synthia Innocent message.   Ashley [name] verbalizes understanding and will keep Dr. Darnell Level updated.

## 2020-12-13 NOTE — Telephone Encounter (Addendum)
Noted. Plz call for update over the weekend.  Ok to use oxygen during the day if o2 levels dropping below 89%.  We can reassess need weekly.

## 2020-12-13 NOTE — Addendum Note (Signed)
Addended by: [name] on: 12/13/2020 11:21 AM   Modules accepted: Orders

## 2020-12-13 NOTE — Telephone Encounter (Addendum)
New order placed.  Would encourage her to keep trying to wean off over next few weeks, and if maintaining O2 levels at rest >89% off oxygen, not use it.

## 2020-12-13 NOTE — Telephone Encounter (Signed)
Patient left a voicemail stating that she needs her oxygen order changed from night time use only to full time. Called patient back and was advised that when the medicare nurse was there Friday her oxygen level was 96% and shortly after taking the oxygen off it dropped to 87% while sitting. Patient stated when she got up moving around her oxygen dropped to 86%. Patient stated shortly after putting the oxygen back on it went up to 94%. Patient stated Saturday when she took her oxygen off for a shower it dropped to 86%. Patient is requesting a new order for oxygen for full time. Patient stated that her tank is only good for 3 hours. Patient stated that she needs a tank that will last longer that she can use outside of her home. Patient stated that she needs this for grocery shopping, playing cards and traveling to visit family out of town.

## 2020-12-14 ENCOUNTER — Ambulatory Visit: Payer: Medicare Other | Admitting: Family Medicine

## 2020-12-14 ENCOUNTER — Ambulatory Visit: Admission: RE | Admit: 2020-12-14 | Payer: Medicare Other | Source: Home / Self Care | Admitting: Ophthalmology

## 2020-12-14 HISTORY — DX: Family history of other specified conditions: Z84.89

## 2020-12-14 SURGERY — PHACOEMULSIFICATION, CATARACT, WITH IOL INSERTION
Anesthesia: Topical | Laterality: Right

## 2020-12-16 ENCOUNTER — Telehealth: Payer: Self-pay

## 2020-12-16 NOTE — Progress Notes (Addendum)
Chronic Care Management Pharmacy Assistant   Name: Ashley PLOTT  MRN: [id number] DOB: [birth year]  Reason for Encounter: Patient Assistance [name])  Recent office visits:  12/08/20- PCP- Patient presented for Covid-19 follow up. Prescribed home oxygen for night time.  11/29/20- PCP- Video Visit- Presented for fever- prescribed Paxlovid for 10 day due to Covid. No follow ups.  11/23/20- PCP- Patient presents for chest and flank pain. Prescribed gabapentin for pain.   Recent consult visits:  None   Hospital visits:  Medication Reconciliation was completed by comparing discharge summary, patient's EMR and Pharmacy list, and upon discussion with patient.  Admitted to the hospital on 12/02/20 due to Covid-19. Discharge date was 12/04/20. Discharged from [facility].    New?Medications Started at [facility] Discharge:?? -started Nasacort  Medication Changes at Hospital Discharge: -Changed no medications  Medications Discontinued at Hospital Discharge: -No medications discontinued  Medications that remain the same after Hospital Discharge:??  -All other medications will remain the same.    Medications: Outpatient Encounter Medications as of 12/16/2020  Medication Sig   acetaminophen (TYLENOL) 500 MG tablet Take 500 mg by mouth daily as needed for moderate pain or headache (PAIN).    AMBULATORY NON FORMULARY MEDICATION Medication Name:  1 squatty potty to use as needed   Ascorbic Acid (VITAMIN C) 1000 MG tablet Take 1,000 mg by mouth See admin instructions. Take 1000 mg daily, may take an additional 1000 mg as needed for cold symptoms   aspirin EC 81 MG tablet Take 81 mg by mouth at bedtime.    beclomethasone (QVAR REDIHALER) 80 MCG/ACT inhaler Inhale 1 puff into the lungs 2 (two) times daily.   benzonatate (TESSALON) 200 MG capsule Take 1 capsule (200 mg total) by mouth 3 (three) times daily as needed for cough.   Biotin 10 MG CAPS Take 1 tablet by mouth daily.   carvedilol  (COREG) 6.25 MG tablet TAKE 0.5 TABLETS (3.125 MG TOTAL) BY MOUTH 2 (TWO) TIMES DAILY WITH A MEAL.   cholecalciferol (VITAMIN D) 1000 units tablet Take 1,000 Units by mouth daily.    Coenzyme Q10 (COQ-10) 100 MG CAPS Take 100 mg by mouth daily.   dextromethorphan (DELSYM) 30 MG/5ML liquid Take 15 mg by mouth daily as needed for cough.    ezetimibe (ZETIA) 10 MG tablet Take 1 tablet (10 mg total) by mouth daily.   fluorouracil (EFUDEX) 5 % cream Apply topically.   gabapentin (NEURONTIN) 100 MG capsule Take 1 capsule (100 mg total) by mouth 2 (two) times daily. Take one nightly for the first 4 days   GARLIC PO Take 1 capsule by mouth daily.   guaiFENesin (MUCINEX) 600 MG 12 hr tablet Take 600 mg by mouth 2 (two) times daily.   levalbuterol (XOPENEX HFA) 45 MCG/ACT inhaler Inhale 2 puffs into the lungs every 6 (six) hours as needed for wheezing or shortness of breath.   LORazepam (ATIVAN) 0.5 MG tablet Take 0.5-1 tablets (0.25-0.5 mg total) by mouth at bedtime as needed for anxiety or sleep.   magnesium 30 MG tablet Take 30 mg by mouth daily.   Menthol, Topical Analgesic, (BIOFREEZE ROLL-ON EX) Apply topically.   miconazole (MICOTIN) 2 % powder Apply 1 application topically as needed (yeast).   nystatin (MYCOSTATIN) 100000 UNIT/ML suspension TAKE 5 MLS (500,000 UNITS TOTAL) BY MOUTH 3 (THREE) TIMES DAILY.   nystatin cream (MYCOSTATIN) Apply 1 application topically as needed for dry skin (yeast infection).   Omega-3 Fatty Acids (FISH OIL) 1000 MG  CAPS Take 1 capsule (1,000 mg total) by mouth daily.   pantoprazole (PROTONIX) 40 MG tablet Take 1 tablet (40 mg total) by mouth every Monday, Wednesday, and Friday.   potassium chloride (KLOR-CON) 10 MEQ tablet Take 10 mEq by mouth 3 (three) times a week.   pyridOXINE (VITAMIN B-6) 100 MG tablet Take 100 mg by mouth daily.   Red Yeast Rice Extract (RED YEAST RICE PO) Take 1 capsule by mouth in the morning and at bedtime.   sacubitril-valsartan (ENTRESTO)  49-51 MG Take 1 tablet by mouth daily.   spironolactone (ALDACTONE) 25 MG tablet TAKE 1 TABLET BY MOUTH EVERY DAY   torsemide (DEMADEX) 10 MG tablet Take 10 mg by mouth 3 (three) times a week.   triamcinolone (NASACORT) 55 MCG/ACT AERO nasal inhaler Place into the nose.   vitamin E 400 UNIT capsule Take 400 Units by mouth daily.   [DISCONTINUED] bisoprolol (ZEBETA) 5 MG tablet Take 5 mg by mouth daily.   No facility-administered encounter medications on file as of 12/16/2020.  12/16/20 Patient called stating that she only had 2 weeks remaining of her Entresto. She gets this from the manufacturer. Contacted [employer] on behalf of the patient and requested refill. Medication should arrive within 2 business days. Patient aware it may be 12/21/20 before delivery.   12/23/20 Patient left voicemail stating that she had not received her Entresto. Contacted [employer] on behalf of the patient representative Stated that medication was left at front door 12/22/20 at 3:35 pm. Contacted patient to advise. Patient checked front porch and stated medication was there.   [name], CPP notified  [name], [place] Pharmacy Assistant [phone]  I have reviewed the care management and care coordination activities outlined in this encounter and I am certifying that I agree with the content of this note. No further action required.  [name], PharmD Clinical Pharmacist [facility] [phone]

## 2020-12-17 DIAGNOSIS — A0839 Other viral enteritis: Secondary | ICD-10-CM | POA: Diagnosis not present

## 2020-12-17 DIAGNOSIS — U071 COVID-19: Secondary | ICD-10-CM | POA: Diagnosis not present

## 2020-12-17 DIAGNOSIS — I5022 Chronic systolic (congestive) heart failure: Secondary | ICD-10-CM | POA: Diagnosis not present

## 2020-12-17 DIAGNOSIS — J9601 Acute respiratory failure with hypoxia: Secondary | ICD-10-CM | POA: Diagnosis not present

## 2020-12-17 DIAGNOSIS — N183 Chronic kidney disease, stage 3 unspecified: Secondary | ICD-10-CM | POA: Diagnosis not present

## 2020-12-17 DIAGNOSIS — J449 Chronic obstructive pulmonary disease, unspecified: Secondary | ICD-10-CM | POA: Diagnosis not present

## 2020-12-20 ENCOUNTER — Telehealth: Payer: Self-pay | Admitting: *Deleted

## 2020-12-20 NOTE — Telephone Encounter (Signed)
Patient left a voicemail stating that she was calling to give an update on her covid. Patient stated that she had covid 18 days ago. Patient stated that her medicare nurse noticed Friday that she was still hearing wheezing in her upper right back and middle. Patient stated that the nurse noticed that she got real tired with the second walk and her oxygen level dropped to 88-89% without her oxygen.  Patient stated that she has a productive cough that is green. Patient stated  before her shower today her oxygen level was 97%. Patient stated that she was only in the shower without the oxygen about 3 minutes and her oxygen level dropped to 88-89%. Patient stated when her oxygen level drops she gets a headache. Patient wants to know if she should be put on some Prednisone or something else for her cough.  Pharmacy [facility]

## 2020-12-21 MED ORDER — PREDNISONE 20 MG PO TABS: ORAL_TABLET | ORAL | 0 refills | Status: DC

## 2020-12-21 NOTE — Telephone Encounter (Signed)
Spoke with pt asking about her sxs.  States cough is improving but has a small amount of green mucous and fatigue.  Says nurse still hears a little wheezing in upper R chest.  However, pt denies any SOB or fever.  States O2 sat is 93%-98% with O2 but drops to 89%- 90% when off O2.  So she feels it's improving.    I relayed Dr. Synthia Innocent message.  Pt verbalizes understanding.  States she will take prednisone and just wait until 8/19 OV.

## 2020-12-21 NOTE — Telephone Encounter (Signed)
Agree with continued supplemental oxygen use.  How much is she coughing? Any fever or shortness of breath?  We can try prednisone course - I have sent in for her. If above associated symptoms would recommend OV for evaluation.

## 2020-12-25 DIAGNOSIS — A0839 Other viral enteritis: Secondary | ICD-10-CM | POA: Diagnosis not present

## 2020-12-25 DIAGNOSIS — J449 Chronic obstructive pulmonary disease, unspecified: Secondary | ICD-10-CM | POA: Diagnosis not present

## 2020-12-25 DIAGNOSIS — N183 Chronic kidney disease, stage 3 unspecified: Secondary | ICD-10-CM | POA: Diagnosis not present

## 2020-12-25 DIAGNOSIS — U071 COVID-19: Secondary | ICD-10-CM | POA: Diagnosis not present

## 2020-12-25 DIAGNOSIS — I5022 Chronic systolic (congestive) heart failure: Secondary | ICD-10-CM | POA: Diagnosis not present

## 2020-12-25 DIAGNOSIS — J9601 Acute respiratory failure with hypoxia: Secondary | ICD-10-CM | POA: Diagnosis not present

## 2020-12-30 DIAGNOSIS — I5022 Chronic systolic (congestive) heart failure: Secondary | ICD-10-CM | POA: Diagnosis not present

## 2020-12-30 DIAGNOSIS — A0839 Other viral enteritis: Secondary | ICD-10-CM | POA: Diagnosis not present

## 2020-12-30 DIAGNOSIS — J449 Chronic obstructive pulmonary disease, unspecified: Secondary | ICD-10-CM | POA: Diagnosis not present

## 2020-12-30 DIAGNOSIS — U071 COVID-19: Secondary | ICD-10-CM | POA: Diagnosis not present

## 2020-12-30 DIAGNOSIS — J9601 Acute respiratory failure with hypoxia: Secondary | ICD-10-CM | POA: Diagnosis not present

## 2020-12-30 DIAGNOSIS — N183 Chronic kidney disease, stage 3 unspecified: Secondary | ICD-10-CM | POA: Diagnosis not present

## 2020-12-31 ENCOUNTER — Other Ambulatory Visit: Payer: Self-pay

## 2020-12-31 ENCOUNTER — Telehealth: Payer: Self-pay | Admitting: Family Medicine

## 2020-12-31 ENCOUNTER — Ambulatory Visit (INDEPENDENT_AMBULATORY_CARE_PROVIDER_SITE_OTHER): Payer: Medicare Other | Admitting: Family Medicine

## 2020-12-31 ENCOUNTER — Encounter: Payer: Self-pay | Admitting: Family Medicine

## 2020-12-31 VITALS — BP 110/62 | HR 87 | Temp 97.6°F | Ht 65.0 in | Wt 183.6 lb

## 2020-12-31 DIAGNOSIS — J9600 Acute respiratory failure, unspecified whether with hypoxia or hypercapnia: Secondary | ICD-10-CM

## 2020-12-31 DIAGNOSIS — K5909 Other constipation: Secondary | ICD-10-CM | POA: Diagnosis not present

## 2020-12-31 DIAGNOSIS — I420 Dilated cardiomyopathy: Secondary | ICD-10-CM

## 2020-12-31 DIAGNOSIS — G4734 Idiopathic sleep related nonobstructive alveolar hypoventilation: Secondary | ICD-10-CM | POA: Diagnosis not present

## 2020-12-31 DIAGNOSIS — U071 COVID-19: Secondary | ICD-10-CM

## 2020-12-31 DIAGNOSIS — I5022 Chronic systolic (congestive) heart failure: Secondary | ICD-10-CM | POA: Diagnosis not present

## 2020-12-31 MED ORDER — BENZONATATE 200 MG PO CAPS: 200.0000 mg | ORAL_CAPSULE | Freq: Three times a day (TID) | ORAL | 6 refills | Status: DC | PRN

## 2020-12-31 NOTE — Patient Instructions (Addendum)
Consider cutting either entresto or spironolactone in half when your blood pressures drop to 80-90s.  Touch base with cardiology about ongoing oxygen need and possible heart ultrasound.  Good to see you today  Return in 2-3 months for wellness visit.

## 2020-12-31 NOTE — Progress Notes (Signed)
Patient ID: Ashley [name], female    DOB: 1946-01-01, 75 y.o.   MRN: [id number]  This visit was conducted in person.  BP 110/62   Pulse 87   Temp 97.6 F (36.4 C) (Temporal)   Ht '5\' 5"'$  (1.651 m)   Wt 183 lb 9 oz (83.3 kg)   SpO2 97% Comment: 2 L  BMI 30.55 kg/m   BP Readings from Last 3 Encounters:  12/31/20 110/62  12/08/20 126/66  12/04/20 138/71    CC: follow up visit post-COVID.  Subjective:   HPI: Ashley [name] is a 75 y.o. female presenting on 12/31/2020 for Follow-up (C/o BP being up and down.)   COVID infection 11/2020 s/p hospitalization for acute hypoxic respiratory failure despite Paxlovid. CTA negative for pneumonia. Paxlovid caused watery diarrhea and may have contributed to dehydration/hypotension. Spironolactone, carvedilol, torsemide held upon discharge. She has subsequently restarted all of these meds.   Due to nocturnal hypoxia after COVID, we started nocturnal oxygen as well as during day PRN. Today took oxygen off for shower - O2 dropped to 90%. She continues wearing oxygen for majority of the day.   Since COVID notes fluctuating BP readings: on Tuesday of this week 80-125/56-72, HR 82-100. Has felt more lethargic this past week.   She did receive prednisone course with improvement in wheezing. Cough is also largely better but she notes intermittent productive cough of mild mucous.   Interestingly, since she started supplemental oxygen use, she finds leg cramping and constipation have significant improved.   Next cardiology appt 01/05/2021. May ask for echo post-COVID as she's worried about heart pumping function.   Requests moisture bottle that attaches to oxygen machine - ordered through [employer] [facility] is contact).      Relevant past medical, surgical, family and social history reviewed and updated as indicated. Interim medical history since our last visit reviewed. Allergies and medications reviewed and updated. Outpatient  Medications Prior to Visit  Medication Sig Dispense Refill   acetaminophen (TYLENOL) 500 MG tablet Take 500 mg by mouth daily as needed for moderate pain or headache (PAIN).      AMBULATORY NON FORMULARY MEDICATION Medication Name:  1 squatty potty to use as needed 1 each 0   Ascorbic Acid (VITAMIN C) 1000 MG tablet Take 1,000 mg by mouth See admin instructions. Take 1000 mg daily, may take an additional 1000 mg as needed for cold symptoms     aspirin EC 81 MG tablet Take 81 mg by mouth at bedtime.      beclomethasone (QVAR REDIHALER) 80 MCG/ACT inhaler Inhale 1 puff into the lungs 2 (two) times daily. 1 each 2   Biotin 10 MG CAPS Take 1 tablet by mouth daily.     carvedilol (COREG) 6.25 MG tablet TAKE 0.5 TABLETS (3.125 MG TOTAL) BY MOUTH 2 (TWO) TIMES DAILY WITH A MEAL. 90 tablet 0   cholecalciferol (VITAMIN D) 1000 units tablet Take 1,000 Units by mouth daily.      Coenzyme Q10 (COQ-10) 100 MG CAPS Take 100 mg by mouth daily.     dextromethorphan (DELSYM) 30 MG/5ML liquid Take 15 mg by mouth daily as needed for cough.      ezetimibe (ZETIA) 10 MG tablet Take 1 tablet (10 mg total) by mouth daily. 90 tablet 3   fluorouracil (EFUDEX) 5 % cream Apply topically.     gabapentin (NEURONTIN) 100 MG capsule Take 1 capsule (100 mg total) by mouth 2 (two) times daily. Take  one nightly for the first 4 days 60 capsule 3   GARLIC PO Take 1 capsule by mouth daily.     guaiFENesin (MUCINEX) 600 MG 12 hr tablet Take 600 mg by mouth 2 (two) times daily.     levalbuterol (XOPENEX HFA) 45 MCG/ACT inhaler Inhale 2 puffs into the lungs every 6 (six) hours as needed for wheezing or shortness of breath. 15 g 3   LORazepam (ATIVAN) 0.5 MG tablet Take 0.5-1 tablets (0.25-0.5 mg total) by mouth at bedtime as needed for anxiety or sleep. 30 tablet 1   magnesium 30 MG tablet Take 30 mg by mouth daily.     Menthol, Topical Analgesic, (BIOFREEZE ROLL-ON EX) Apply topically.     miconazole (MICOTIN) 2 % powder Apply 1  application topically as needed (yeast).     nystatin (MYCOSTATIN) 100000 UNIT/ML suspension TAKE 5 MLS (500,000 UNITS TOTAL) BY MOUTH 3 (THREE) TIMES DAILY. 120 mL 1   nystatin cream (MYCOSTATIN) Apply 1 application topically as needed for dry skin (yeast infection). 30 g 3   Omega-3 Fatty Acids (FISH OIL) 1000 MG CAPS Take 1 capsule (1,000 mg total) by mouth daily.  0   pantoprazole (PROTONIX) 40 MG tablet Take 1 tablet (40 mg total) by mouth every Monday, Wednesday, and Friday.     potassium chloride (KLOR-CON) 10 MEQ tablet Take 10 mEq by mouth 3 (three) times a week.     predniSONE (DELTASONE) 20 MG tablet Take two tablets daily for 2 days followed by one tablet daily for 4 days 8 tablet 0   pyridOXINE (VITAMIN B-6) 100 MG tablet Take 100 mg by mouth daily.     Red Yeast Rice Extract (RED YEAST RICE PO) Take 1 capsule by mouth in the morning and at bedtime.     sacubitril-valsartan (ENTRESTO) 49-51 MG Take 1 tablet by mouth daily.     spironolactone (ALDACTONE) 25 MG tablet TAKE 1 TABLET BY MOUTH EVERY DAY 90 tablet 1   torsemide (DEMADEX) 10 MG tablet Take 10 mg by mouth 3 (three) times a week.     triamcinolone (NASACORT) 55 MCG/ACT AERO nasal inhaler Place into the nose.     vitamin E 400 UNIT capsule Take 400 Units by mouth daily.     benzonatate (TESSALON) 200 MG capsule Take 1 capsule (200 mg total) by mouth 3 (three) times daily as needed for cough. 30 capsule 6   No facility-administered medications prior to visit.     Per HPI unless specifically indicated in ROS section below Review of Systems  Objective:  BP 110/62   Pulse 87   Temp 97.6 F (36.4 C) (Temporal)   Ht '5\' 5"'$  (1.651 m)   Wt 183 lb 9 oz (83.3 kg)   SpO2 97% Comment: 2 L  BMI 30.55 kg/m   Wt Readings from Last 3 Encounters:  12/31/20 183 lb 9 oz (83.3 kg)  12/08/20 180 lb 5 oz (81.8 kg)  12/03/20 181 lb 7 oz (82.3 kg)      Physical Exam Vitals and nursing note reviewed.  Constitutional:       Appearance: Normal appearance. She is not ill-appearing.  Cardiovascular:     Rate and Rhythm: Normal rate and regular rhythm.     Pulses: Normal pulses.     Heart sounds: Normal heart sounds. No murmur heard. Pulmonary:     Effort: Pulmonary effort is normal. No respiratory distress.     Breath sounds: Normal breath sounds. No wheezing, rhonchi or  rales.     Comments: Lungs clear Musculoskeletal:     Right lower leg: No edema.     Left lower leg: No edema.     Comments: No significant pedal edema  Skin:    General: Skin is warm and dry.     Findings: No rash.  Neurological:     Mental Status: She is alert.  Psychiatric:        Mood and Affect: Mood normal.        Behavior: Behavior normal.       Assessment & Plan:  This visit occurred during the SARS-CoV-2 public health emergency.  Safety protocols were in place, including screening questions prior to the visit, additional usage of staff PPE, and extensive cleaning of exam room while observing appropriate contact time as indicated for disinfecting solutions.   Problem List Items Addressed This Visit     Chronic systolic CHF (congestive heart failure) (HCC)    Back on torsemide, spironolactone, entresto.  Fluctuating blood pressures noted recently ?post-COVID effect. Discussed halving dose of either entresto or spironolactone if further hypotension noted. She does have cards f/u planned next week.       Cardiomyopathy, dilated, nonischemic (HCC)   Chronic constipation    She finds this may have improved since starting supplemental oxygen.       Acute respiratory failure due to COVID-19 [facility])    This has largely resolved however notes ongoing need for oxygen. See above.       Nocturnal hypoxia - Primary    Noted after COVID.  She continues nocturnal supplemental oxygen as well as during the day for ongoing noted exertional dyspnea.  Will continue for now, but discussed continued tapering of oxygen as able.  Requests  humidifier for oxygen use - will send request to [employer].         Meds ordered this encounter  Medications   benzonatate (TESSALON) 200 MG capsule    Sig: Take 1 capsule (200 mg total) by mouth 3 (three) times daily as needed for cough.    Dispense:  30 capsule    Refill:  6   No orders of the defined types were placed in this encounter.    Patient Instructions  Consider cutting either entresto or spironolactone in half when your blood pressures drop to 80-90s.  Touch base with cardiology about ongoing oxygen need and possible heart ultrasound.  Good to see you today  Return in 2-3 months for wellness visit.   Follow up plan: Return in about 3 months (around 04/02/2021) for medicare wellness visit.  [name], MD

## 2020-12-31 NOTE — Telephone Encounter (Signed)
Sent Community Msg to [place] with request.

## 2020-12-31 NOTE — Telephone Encounter (Signed)
ERROR

## 2020-12-31 NOTE — Telephone Encounter (Signed)
Can we contact [facility] and request moisture/humidifier be attached to oxygen machine? Ashley [name] is contact at [employer]).

## 2021-01-01 DIAGNOSIS — G4734 Idiopathic sleep related nonobstructive alveolar hypoventilation: Secondary | ICD-10-CM | POA: Insufficient documentation

## 2021-01-01 NOTE — Assessment & Plan Note (Signed)
Back on torsemide, spironolactone, entresto.  Fluctuating blood pressures noted recently ?post-COVID effect. Discussed halving dose of either entresto or spironolactone if further hypotension noted. She does have cards f/u planned next week.

## 2021-01-01 NOTE — Assessment & Plan Note (Signed)
Noted after COVID.  She continues nocturnal supplemental oxygen as well as during the day for ongoing noted exertional dyspnea.  Will continue for now, but discussed continued tapering of oxygen as able.  Requests humidifier for oxygen use - will send request to [place].

## 2021-01-01 NOTE — Assessment & Plan Note (Signed)
She finds this may have improved since starting supplemental oxygen.

## 2021-01-01 NOTE — Assessment & Plan Note (Signed)
This has largely resolved however notes ongoing need for oxygen. See above.

## 2021-01-05 ENCOUNTER — Ambulatory Visit (INDEPENDENT_AMBULATORY_CARE_PROVIDER_SITE_OTHER): Payer: Medicare Other | Admitting: Nurse Practitioner

## 2021-01-05 ENCOUNTER — Other Ambulatory Visit: Payer: Self-pay

## 2021-01-05 ENCOUNTER — Encounter: Payer: Self-pay | Admitting: Nurse Practitioner

## 2021-01-05 VITALS — BP 104/72 | HR 93 | Ht 65.0 in | Wt 184.0 lb

## 2021-01-05 DIAGNOSIS — E782 Mixed hyperlipidemia: Secondary | ICD-10-CM

## 2021-01-05 DIAGNOSIS — I10 Essential (primary) hypertension: Secondary | ICD-10-CM | POA: Diagnosis not present

## 2021-01-05 DIAGNOSIS — I420 Dilated cardiomyopathy: Secondary | ICD-10-CM | POA: Diagnosis not present

## 2021-01-05 DIAGNOSIS — I5020 Unspecified systolic (congestive) heart failure: Secondary | ICD-10-CM | POA: Diagnosis not present

## 2021-01-05 MED ORDER — TORSEMIDE 10 MG PO TABS: 10.0000 mg | ORAL_TABLET | ORAL | 1 refills | Status: DC

## 2021-01-05 MED ORDER — SPIRONOLACTONE 25 MG PO TABS: 25.0000 mg | ORAL_TABLET | Freq: Every day | ORAL | 1 refills | Status: DC

## 2021-01-05 MED ORDER — CARVEDILOL 6.25 MG PO TABS: 3.1250 mg | ORAL_TABLET | Freq: Two times a day (BID) | ORAL | 1 refills | Status: DC

## 2021-01-05 MED ORDER — POTASSIUM CHLORIDE ER 10 MEQ PO TBCR: 10.0000 meq | EXTENDED_RELEASE_TABLET | ORAL | 1 refills | Status: DC

## 2021-01-05 NOTE — Progress Notes (Signed)
Office Visit    Patient Name: Ashley [name] Date of Encounter: 01/05/2021  Primary Care Provider:  [name], MD Primary Cardiologist:  [name], MD/D. Bensimhon, MD (CHF)/G. Lovena Le, MD (EP)  Chief Complaint    75 year old female with a history of nonischemic cardiomyopathy, HFrEF, obesity, hypertension, COPD, GERD, fibromyalgia, diverticulitis status post partial colon resection, MGUS, and left bundle branch block, who presents for follow-up related to heart failure.  Past Medical History    Past Medical History:  Diagnosis Date   Abdominal aortic atherosclerosis ([place]) 11/2015   by CT   Allergy    Anemia    Anxiety    Automatic implantable cardioverter-defibrillator in situ    a. 2012 s/p MDT single lead ICD; b. 10/2018 BiV upgrade-->MDT ser # [id number] H.   Bronchiectasis    a. possibly mild, treat URIs aggressively   Cancer ([place])    basal cell, arms & neck   Chronic sinusitis    COPD (chronic obstructive pulmonary disease) (HCC)    Depression    Dyspnea    Essential hypertension    Pt denies   Family history of adverse reaction to anesthesia    Mother - PONV   Fibrocystic breast disease    Fibromyalgia    GERD (gastroesophageal reflux disease)    H/O multiple allergies 10/10/2013   Hemorrhoids    HFrEF (heart failure with reduced ejection fraction) ([place])    a. 06/2011 cMRI: EF 34%, no LGE; b. 01/2016 Echo: EF 35-40%; c. 07/2016 Echo: EF 40-45%; d. 02/2020 Echo: EF 20-25%, glob HK. GrI DD. Mildly red RV fxn. Mildly dil LA. Triv MR.   History of diverticulitis of colon    History of pneumonia 2012   Insomnia    Irritable bowel syndrome    Laryngeal nodule    Dr. Thomasena Edis   Migraine without aura, without mention of intractable migraine without mention of status migrainosus    Mitral regurgitation    Nonischemic cardiomyopathy ([place])    a. 2012 s/p single lead ICD; b. 06/2011 cMRI: EF 34%, no LGE; b. 01/2016 Echo: EF 35-40%; c. 07/2016 Echo: EF 40-45%;  d. 10/2018 Cath: nl cors. EF 20-25%; e. 10/2018 s/p BiV MDT ICD upgrade; f. 02/2020 Echo: EF 20-25%, glob HK. GrI DD.   Osteoarthritis    knees, back, hands, low back,    Osteoporosis 01/2013   T-2.7 spine (01/2016)   PONV (postoperative nausea and vomiting)    sigmoid diverticulitis with perforation, abscess and fistula 09/30/2013   Sleep apnea 2010   borderline, inconclusive- /w [place]    Past Surgical History:  Procedure Laterality Date   ABDOMINAL HYSTERECTOMY  1987   w/BSO   ANAL RECTAL MANOMETRY N/A 02/02/2016   Procedure: ANO RECTAL MANOMETRY;  Surgeon: Mauri Pole, MD;  Location: [facility];  Service: Endoscopy;  Laterality: N/A;   APPENDECTOMY     AUGMENTATION MAMMAPLASTY     BIV UPGRADE N/A 11/04/2018   Procedure: BIV ICD  UPGRADE;  Surgeon: Evans Lance, MD;  Location: [facility];  Service: Cardiovascular;  Laterality: N/A;   breast cystectomies     due to FCBD   BREAST MASS EXCISION  01/2010   Left-fibrocystic change w/intraductal papilloma, no malignancy   carotid US  [id number]   [id number] LICA, normal RICA   [place] Bunionectomy  11/04/08   Right Great Toe (Dr. Beola Cord)   COLONOSCOPY  01/2018   1 small benign polyp, diverticulosis, healthy anastomosis [name] @ [place])  COLOSTOMY N/A 10/02/2013   DESCENDING COLOSTOMY;  Surgeon: Imogene Burn. Merwin, MD   COLOSTOMY REVERSAL N/A 02/10/2014   [name], MD   EMG/MCV  10/16/01   + mild carpal tunnel   ESOPHAGOGASTRODUODENOSCOPY  09/2014   small HH, irregular Z line, fundic gland polyps with mild gastritis Olevia Perches)   Flex laryngoscopy  06/11/06   ([place]) nml   IMPLANTABLE CARDIOVERTER DEFIBRILLATOR IMPLANT N/A 07/14/2011   MDT single chamber ICD   KNEE ARTHROSCOPY W/ ORIF     Left; "meniscus tear"   LASIK     left eye   MANDIBLE SURGERY     "got about 6 pins in the bottom of my jaw"   NCS  11/2014   L ulnar neuropathy, mild B median nerve entrapment ([place])   OSTEOTOMY     Left foot   PARTIAL COLECTOMY  N/A 10/02/2013   PARTIAL COLECTOMY;  Surgeon: Imogene Burn. Tsuei, MD   RIGHT/LEFT HEART CATH AND CORONARY ANGIOGRAPHY N/A 10/16/2018   Procedure: RIGHT/LEFT HEART CATH AND CORONARY ANGIOGRAPHY;  Surgeon: Wellington Hampshire, MD;  Location: [facility];  Service: Cardiovascular;  Laterality: N/A;   sinus surgery     x3 with balloon    TOE AMPUTATION     "toe beside baby toe on left foot; got gangrene from corn"   TONSILLECTOMY AND ADENOIDECTOMY      Allergies  Allergies  Allergen Reactions   Atorvastatin Other (See Comments)    Severe muscle cramps to generic atorvastatin.  Able to take brand lipitor.   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    Influenza Vac Split [Influenza Virus Vaccine] Other (See Comments)    "allergic to concentrated eggs that are in vaccine"   Latex Nausea Only, Rash and Other (See Comments)    Pain and infection   Morphine And Related Other (See Comments)    Internally feels like she is on fire    Pravastatin Other (See Comments)    myalgias   Prevacid [Lansoprazole] Other (See Comments)    Worsened GI side effects   Prevnar [Pneumococcal 13-Val Conj Vacc] Other (See Comments)    Egg allergy Bad reaction after shot   Rosuvastatin Other (See Comments)    Was not effective controlling lipids   Simvastatin Other (See Comments)    Leg cramps   Tape Other (See Comments) and Rash    All adhesives  - Blisters, itching and  burning  blistering   Farxiga [Dapagliflozin]    Metaxalone    Tramadol     Leg cramps   Antihistamines, Chlorpheniramine-Type Other (See Comments)    Alters vision   Betadine [Povidone Iodine] Rash    Per pt.    Onion Diarrhea and Other (See Comments)    GI distress, flared IBS   Other Hives and Rash    NOTE: pt is able to take cephalosporins without reaction  Penicillins Other (See Comments)    CHILDHOOD ALLEGY/REACTION: "told 50 years ago I couldn't take it; might be immune to it", able to take amoxicillin Has patient had a PCN reaction causing immediate rash, facial/tongue/throat swelling, SOB or lightheadedness with hypotension: Unknown Has patient had a PCN reaction causing severe rash involving mucus membranes or skin necrosis: UnknowN Has patient had a PCN reaction that required hospitalization: /Unknown Has patient had a PCN reaction occurring within the last 10 years: Unknown If a   Red Dye Nausea Only and Swelling   Sertraline Other (See Comments)    Overly sedating   Sulfasalazine Other (See Comments)    "makes my whole body smell like sulfa & makes me sick just smelling it"    History of Present Illness    75 year old female with the above complex past medical history including HFrEF secondary to nonischemic cardiomyopathy, obesity, hypertension, COPD, GERD, fibromyalgia, diverticulitis status post partial colon resection with reconnection, MGUS, and left bundle branch block.  Nonischemic cardiomyopathy history dates back to October 2012, when she developed pneumonia in and was found to have an EF of 25 to 35%.  Work-up was suggestive of nonischemic cardiomyopathy.  She subsequently underwent single-lead ICD placement.  EF did improve, rising to 30 to 35% in September 2013, and 60 to 65% in 2019, but subsequently dropped to 15 to 20% by echo in June 2020.  Diagnostic catheterization at that time showed nonobstructive disease.  She underwent biventricular ICD upgrade at  that time.  Most recent echo in October 2021 showed an EF of 20 to 25% with global hypokinesis and grade 1 diastolic dysfunction.  She was seen in heart failure clinic in February 2022.  It was felt that obesity and deconditioning are playing a major role in her functional limitations.  She was stable at her most recent cardiology visit in May.  It was noted that Wilder Glade was discontinued secondary to intolerance.  In late July, she was admitted to the hospital in the setting of COVID-19 infection and presyncope.  Volume status was felt to be stable.  She was discharged home and treated with paxlovid.  She was required supplemental oxygen since her discharge.  She feels as though it has helped quite a bit and says that when her oxygen saturations drop below 93%, she has headaches and body cramps.  Therefore, she has been wearing around-the-clock.  She feels as though her dyspnea and fatigue have worsened since her COVID infection.  She is concerned that her EF might have dropped and has requested an echocardiogram.  Her weight has been stable.  She denies PND, orthopnea, syncope, edema, or early satiety.  She sometimes notes lightheadedness when standing too quickly.  She also has intermittent chest discomfort which she describes as indigestion-like, and this dates back several years.  Home Medications    Current Outpatient Medications  Medication Sig Dispense Refill   acetaminophen (TYLENOL) 500 MG tablet Take 500 mg by mouth daily as needed for moderate pain or headache (PAIN).      AMBULATORY NON FORMULARY MEDICATION Medication Name:  1 squatty potty to use as needed 1 each 0   Ascorbic Acid (VITAMIN C) 1000 MG tablet Take 1,000 mg by mouth See admin instructions. Take 1000 mg daily, may take an additional 1000 mg as needed for cold symptoms     aspirin EC 81 MG tablet Take 81 mg by mouth at bedtime.      beclomethasone (QVAR REDIHALER) 80 MCG/ACT inhaler Inhale 1  puff into the lungs 2 (two) times  daily. 1 each 2   benzonatate (TESSALON) 200 MG capsule Take 1 capsule (200 mg total) by mouth 3 (three) times daily as needed for cough. 30 capsule 6   Biotin 10 MG CAPS Take 1 tablet by mouth daily.     carvedilol (COREG) 6.25 MG tablet TAKE 0.5 TABLETS (3.125 MG TOTAL) BY MOUTH 2 (TWO) TIMES DAILY WITH A MEAL. 90 tablet 0   cholecalciferol (VITAMIN D) 1000 units tablet Take 1,000 Units by mouth daily.      Coenzyme Q10 (COQ-10) 100 MG CAPS Take 100 mg by mouth daily.     dextromethorphan (DELSYM) 30 MG/5ML liquid Take 15 mg by mouth daily as needed for cough.      ezetimibe (ZETIA) 10 MG tablet Take 1 tablet (10 mg total) by mouth daily. 90 tablet 3   fluorouracil (EFUDEX) 5 % cream Apply topically.     gabapentin (NEURONTIN) 100 MG capsule Take 1 capsule (100 mg total) by mouth 2 (two) times daily. Take one nightly for the first 4 days 60 capsule 3   GARLIC PO Take 1 capsule by mouth daily.     guaiFENesin (MUCINEX) 600 MG 12 hr tablet Take 600 mg by mouth 2 (two) times daily.     levalbuterol (XOPENEX HFA) 45 MCG/ACT inhaler Inhale 2 puffs into the lungs every 6 (six) hours as needed for wheezing or shortness of breath. 15 g 3   LORazepam (ATIVAN) 0.5 MG tablet Take 0.5-1 tablets (0.25-0.5 mg total) by mouth at bedtime as needed for anxiety or sleep. 30 tablet 1   magnesium 30 MG tablet Take 30 mg by mouth daily.     Menthol, Topical Analgesic, (BIOFREEZE ROLL-ON EX) Apply topically.     miconazole (MICOTIN) 2 % powder Apply 1 application topically as needed (yeast).     nystatin (MYCOSTATIN) 100000 UNIT/ML suspension TAKE 5 MLS (500,000 UNITS TOTAL) BY MOUTH 3 (THREE) TIMES DAILY. 120 mL 1   nystatin cream (MYCOSTATIN) Apply 1 application topically as needed for dry skin (yeast infection). 30 g 3   Omega-3 Fatty Acids (FISH OIL) 1000 MG CAPS Take 1 capsule (1,000 mg total) by mouth daily.  0   pantoprazole (PROTONIX) 40 MG tablet Take 1 tablet (40 mg total) by mouth every Monday, Wednesday,  and Friday.     potassium chloride (KLOR-CON) 10 MEQ tablet Take 10 mEq by mouth 3 (three) times a week.     pyridOXINE (VITAMIN B-6) 100 MG tablet Take 100 mg by mouth daily.     Red Yeast Rice Extract (RED YEAST RICE PO) Take 1 capsule by mouth in the morning and at bedtime.     sacubitril-valsartan (ENTRESTO) 49-51 MG Take 1 tablet by mouth daily.     spironolactone (ALDACTONE) 25 MG tablet TAKE 1 TABLET BY MOUTH EVERY DAY 90 tablet 1   torsemide (DEMADEX) 10 MG tablet Take 10 mg by mouth 3 (three) times a week.     triamcinolone (NASACORT) 55 MCG/ACT AERO nasal inhaler Place into the nose.     vitamin E 400 UNIT capsule Take 400 Units by mouth daily.     No current facility-administered medications for this visit.     Review of Systems    Occasional chest discomfort which dates back several years and she believes is related to GERD.  Chronic dyspnea on exertion which is worsened since COVID infection.  Occasional orthostatic lightheadedness.  She denies PND, orthopnea, syncope, edema, or  early satiety.  All other systems reviewed and are otherwise negative except as noted above.  Physical Exam    VS:  BP 104/72 (BP Location: Left Arm, Patient Position: Sitting, Cuff Size: Normal)   Pulse 93   Ht '5\' 5"'$  (1.651 m)   Wt 184 lb (83.5 kg)   SpO2 96%   BMI 30.62 kg/m  , BMI Body mass index is 30.62 kg/m.     GEN: Well nourished, well developed, in no acute distress. HEENT: normal. Neck: Supple, no JVD, carotid bruits, or masses. Cardiac: RRR, no murmurs, rubs, or gallops. No clubbing, cyanosis, edema.  Radials/PT 2+ and equal bilaterally.  Respiratory:  Respirations regular and unlabored, clear to auscultation bilaterally. GI: Soft, nontender, nondistended, BS + x 4. MS: no deformity or atrophy. Skin: warm and dry, no rash. Neuro:  Strength and sensation are intact. Psych: Normal affect.  Accessory Clinical Findings    ECG personally reviewed by me today -a sensed, V pacing, 93-  no acute changes.  Lab Results  Component Value Date   WBC 13.7 (H) 12/04/2020   HGB 14.4 12/04/2020   HCT 43.3 12/04/2020   MCV 89.5 12/04/2020   PLT 226 12/04/2020   Lab Results  Component Value Date   CREATININE 0.87 12/03/2020   BUN 18 12/03/2020   NA 137 12/03/2020   K 4.1 12/03/2020   CL 103 12/03/2020   CO2 23 12/03/2020   Lab Results  Component Value Date   ALT 41 12/03/2020   AST 37 12/03/2020   ALKPHOS 84 12/03/2020   BILITOT 0.5 12/03/2020   Lab Results  Component Value Date   CHOL 232 (H) 12/01/2019   HDL 47.70 12/01/2019   LDLCALC 149 (H) 12/01/2019   LDLDIRECT 164.0 11/27/2018   TRIG 172.0 (H) 12/01/2019   CHOLHDL 5 12/01/2019    Lab Results  Component Value Date   HGBA1C 5.9 12/01/2019    Assessment & Plan    1.  Chronic heart failure with reduced ejection fraction/nonischemic cardiomyopathy: Long history of LV dysfunction dating back to 2012 with fluctuating ejection fraction over the years.  EF improved to 60% in 2019 but then dropped again to 20-25% by echo in June 2020.  Catheterization at that time showed normal coronary arteries.  She underwent biventricular ICD upgrade at that time in the setting of a left bundle branch block.  Previously evaluated in heart failure clinic in February with consideration given to CPX testing.  She was diagnosed with COVID in July and had a brief hospitalization related to dehydration.  She was treated with Paxlovid.  She has worn oxygen since then, which she feels has helped, though also notes that dyspnea is worse than prior to COVID infection.  She is euvolemic on examination today and her weight has been stable at home.  She is concerned that her EF has dropped.  She has requested an echocardiogram.  Echo in October 2021 showed persistent LV dysfunction with an EF of 20 to 25%.  Given recent COVID infection with worsening dyspnea, I will obtain an echocardiogram.  We discussed that if EF is stable, following further  recovery from [place], she would likely benefit from CPX testing to sort out what is causing her dyspnea/fatigue.  She does plan to follow-up with heart failure clinic within the next month or 2.  She remains on beta-blocker, Entresto, and spironolactone therapy.  She previously did not tolerate [place].  Blood pressure is soft thus no room for titration of medications.  Heart rate is elevated today and she might benefit from addition of digoxin or ivabradine though with multiple intolerances, wishes to defer.  2.  Essential hypertension: Blood pressure is soft at 104/72 on beta-blocker, Entresto, spironolactone, and torsemide therapy.  3.  Hyperlipidemia: She is on red yeast rice and Zetia in setting of prior statin intolerance.  Her LDL was 149 last July.  Normal coronary arteries on catheterization last June.  4.  Disposition: Patient plans arrange follow-up in heart failure clinic in [place].  We will plan to see her back here in 3 months or sooner if necessary.  Murray Hodgkins, NP 01/05/2021, 10:23 AM

## 2021-01-05 NOTE — Patient Instructions (Addendum)
Medication Instructions:  - Your physician recommends that you continue on your current medications as directed. Please refer to the Current Medication list given to you today.  *If you need a refill on your cardiac medications before your next appointment, please call your pharmacy*   Lab Work: - none ordered  If you have labs (blood work) drawn today and your tests are completely normal, you will receive your results only by: [place] (if you have MyChart) OR A paper copy in the mail If you have any lab test that is abnormal or we need to change your treatment, we will call you to review the results.   Testing/Procedures: - Your physician has requested that you have an echocardiogram. Echocardiography is a painless test that uses sound waves to create images of your heart. It provides your doctor with information about the size and shape of your heart and how well your heart's chambers and valves are working. This procedure takes approximately one hour. There are no restrictions for this procedure. There is a possibility that an IV may need to be started during your test to inject an image enhancing agent. This is done to obtain more optimal pictures of your heart. Therefore we ask that you do at least drink some water prior to coming in to hydrate your veins.     Follow-Up: At [facility], you and your health needs are our priority.  As part of our continuing mission to provide you with exceptional heart care, we have created designated Provider Care Teams.  These Care Teams include your primary Cardiologist (physician) and Advanced Practice Providers (APPs -  Physician Assistants and Nurse Practitioners) who all work together to provide you with the care you need, when you need it.  We recommend signing up for the patient portal called "MyChart".  Sign up information is provided on this After Visit Summary.  MyChart is used to connect with patients for Virtual Visits  (Telemedicine).  Patients are able to view lab/test results, encounter notes, upcoming appointments, etc.  Non-urgent messages can be sent to your provider as well.   To learn more about what you can do with MyChart, go to [email or web address].    Your next appointment:   3 month(s)  The format for your next appointment:   In Person  Provider:   You may see [name], MD or one of the following Advanced Practice Providers on your designated Care Team:   Murray Hodgkins, NP Christell Faith, PA-C Marrianne Mood, PA-C Cadence Kathlen Mody, [place]   Other Instructions  You have been scheduled to follow up with Dr. [name] in the [facility] in [place] on:  Thursday 02/10/21 @ 1:40 pm  If you need to cancel or reschedule this appointment please call the [facility] directly at [phone].

## 2021-01-06 DIAGNOSIS — J9601 Acute respiratory failure with hypoxia: Secondary | ICD-10-CM | POA: Diagnosis not present

## 2021-01-06 DIAGNOSIS — Z7982 Long term (current) use of aspirin: Secondary | ICD-10-CM | POA: Diagnosis not present

## 2021-01-06 DIAGNOSIS — E785 Hyperlipidemia, unspecified: Secondary | ICD-10-CM | POA: Diagnosis not present

## 2021-01-06 DIAGNOSIS — F419 Anxiety disorder, unspecified: Secondary | ICD-10-CM | POA: Diagnosis not present

## 2021-01-06 DIAGNOSIS — A0839 Other viral enteritis: Secondary | ICD-10-CM | POA: Diagnosis not present

## 2021-01-06 DIAGNOSIS — U071 COVID-19: Secondary | ICD-10-CM | POA: Diagnosis not present

## 2021-01-06 DIAGNOSIS — I495 Sick sinus syndrome: Secondary | ICD-10-CM | POA: Diagnosis not present

## 2021-01-06 DIAGNOSIS — K219 Gastro-esophageal reflux disease without esophagitis: Secondary | ICD-10-CM | POA: Diagnosis not present

## 2021-01-06 DIAGNOSIS — I420 Dilated cardiomyopathy: Secondary | ICD-10-CM | POA: Diagnosis not present

## 2021-01-06 DIAGNOSIS — I5022 Chronic systolic (congestive) heart failure: Secondary | ICD-10-CM | POA: Diagnosis not present

## 2021-01-06 DIAGNOSIS — Z6830 Body mass index (BMI) 30.0-30.9, adult: Secondary | ICD-10-CM | POA: Diagnosis not present

## 2021-01-06 DIAGNOSIS — N183 Chronic kidney disease, stage 3 unspecified: Secondary | ICD-10-CM | POA: Diagnosis not present

## 2021-01-06 DIAGNOSIS — J449 Chronic obstructive pulmonary disease, unspecified: Secondary | ICD-10-CM | POA: Diagnosis not present

## 2021-01-06 DIAGNOSIS — Z9181 History of falling: Secondary | ICD-10-CM | POA: Diagnosis not present

## 2021-01-06 DIAGNOSIS — E669 Obesity, unspecified: Secondary | ICD-10-CM | POA: Diagnosis not present

## 2021-01-06 DIAGNOSIS — Z950 Presence of cardiac pacemaker: Secondary | ICD-10-CM | POA: Diagnosis not present

## 2021-01-07 ENCOUNTER — Ambulatory Visit (INDEPENDENT_AMBULATORY_CARE_PROVIDER_SITE_OTHER): Payer: Medicare Other

## 2021-01-07 ENCOUNTER — Other Ambulatory Visit: Payer: Self-pay

## 2021-01-07 DIAGNOSIS — I5020 Unspecified systolic (congestive) heart failure: Secondary | ICD-10-CM | POA: Diagnosis not present

## 2021-01-07 LAB — ECHOCARDIOGRAM COMPLETE
AR max vel: 2.4 cm2
AV Area VTI: 2.25 cm2
AV Area mean vel: 2.22 cm2
AV Mean grad: 6 mmHg
AV Peak grad: 9.4 mmHg
Ao pk vel: 1.53 m/s
Area-P 1/2: 4.8 cm2
S' Lateral: 4.1 cm

## 2021-01-07 MED ORDER — PERFLUTREN LIPID MICROSPHERE
1.0000 mL | INTRAVENOUS | Status: AC | PRN
  Administered 2021-01-07: 2 mL via INTRAVENOUS

## 2021-01-08 ENCOUNTER — Other Ambulatory Visit: Payer: Self-pay | Admitting: Cardiovascular Disease

## 2021-01-11 DIAGNOSIS — J449 Chronic obstructive pulmonary disease, unspecified: Secondary | ICD-10-CM | POA: Diagnosis not present

## 2021-01-11 DIAGNOSIS — N183 Chronic kidney disease, stage 3 unspecified: Secondary | ICD-10-CM | POA: Diagnosis not present

## 2021-01-11 DIAGNOSIS — I5022 Chronic systolic (congestive) heart failure: Secondary | ICD-10-CM | POA: Diagnosis not present

## 2021-01-11 DIAGNOSIS — J9601 Acute respiratory failure with hypoxia: Secondary | ICD-10-CM | POA: Diagnosis not present

## 2021-01-11 DIAGNOSIS — U071 COVID-19: Secondary | ICD-10-CM | POA: Diagnosis not present

## 2021-01-11 DIAGNOSIS — A0839 Other viral enteritis: Secondary | ICD-10-CM | POA: Diagnosis not present

## 2021-01-12 ENCOUNTER — Encounter: Payer: Self-pay | Admitting: Family Medicine

## 2021-01-12 ENCOUNTER — Telehealth: Payer: Self-pay | Admitting: Family Medicine

## 2021-01-12 NOTE — Telephone Encounter (Signed)
Mr. jaleeza shimp brought by some power of attorney paper work I placed in dr. Darnell Level box Home Health verbal orders [place] Agency Name: Dalene Carrow  Callback number:   Requesting OT/PT/Skilled nursing/Social Work/Speech:  Reason:paperwork to be looked over  Frequency:  Please forward to [facility] pool or providers CMA

## 2021-01-12 NOTE — Telephone Encounter (Signed)
Paperwork placed in Dr. Synthia Innocent box.

## 2021-01-12 NOTE — Telephone Encounter (Signed)
Pt husband brought in paperwor for Dr. Darnell Level to look over.([place]) Type of forms received:power of attorney  Routed to: Otilio Jefferson received by :  terrill  Individual made aware of 3-5 business day turn around (Y/N): y Form completed and patient made aware of charges(Y/N): y  Faxed to :   Form location:  dr. box

## 2021-01-13 NOTE — Telephone Encounter (Addendum)
This was actually husband's HCPOA form - will forward to his PCP tomorrow when I'm in office.

## 2021-01-14 ENCOUNTER — Telehealth: Payer: Self-pay | Admitting: Family Medicine

## 2021-01-14 DIAGNOSIS — Z7189 Other specified counseling: Secondary | ICD-10-CM

## 2021-01-14 NOTE — Telephone Encounter (Signed)
Mrs. Agosta called in wanted to know if Dr. Darnell Level could write an order for a smaller tank and its for an oxygen conserving device.  And in order to do that she has to walk 6 mins under Medicare and she is under [employer] under Andee Poles she is the one that helps her get it at [employer]. And the fax number [phone] And she dropped her DNR paperwork and Dr. Damita Dunnings has her husband but Dr. Darnell Level doesn't have hers.

## 2021-01-19 NOTE — Telephone Encounter (Addendum)
Plz fax over Rx. However if insurance denies, notify pt we will need to refer her to pulm for evaluation and consideration of 6 min walk test.  Also, I did receive her HCPOA forms.

## 2021-01-19 NOTE — Telephone Encounter (Addendum)
Rx written for smaller oxygen tank with O2 conserving device and placed in Ashley [name]'s box.  It sounds like pt will need 6 minute walk to qualify for this - plz schedule nurse visit for this.  We received husband's advanced directive but not patient's. Can she drop off another copy?

## 2021-01-19 NOTE — Telephone Encounter (Addendum)
After speaking with Mandy/Shannon, RN about opening a slot on [place] schedule, they informed me that we are not trained for 6 min walks with pt.  This will need to be done with pulmonology.   [Written order is in Copy on [employer].]

## 2021-01-20 DIAGNOSIS — N183 Chronic kidney disease, stage 3 unspecified: Secondary | ICD-10-CM | POA: Diagnosis not present

## 2021-01-20 DIAGNOSIS — I5022 Chronic systolic (congestive) heart failure: Secondary | ICD-10-CM | POA: Diagnosis not present

## 2021-01-20 DIAGNOSIS — J449 Chronic obstructive pulmonary disease, unspecified: Secondary | ICD-10-CM | POA: Diagnosis not present

## 2021-01-20 DIAGNOSIS — A0839 Other viral enteritis: Secondary | ICD-10-CM | POA: Diagnosis not present

## 2021-01-20 DIAGNOSIS — J9601 Acute respiratory failure with hypoxia: Secondary | ICD-10-CM | POA: Diagnosis not present

## 2021-01-20 DIAGNOSIS — U071 COVID-19: Secondary | ICD-10-CM | POA: Diagnosis not present

## 2021-01-20 NOTE — Telephone Encounter (Signed)
Faxed order.   Lvm asking pt to call back.  Need to relay Dr. Synthia Innocent message.

## 2021-01-21 NOTE — Telephone Encounter (Signed)
Faxed order.   Lvm asking pt to call back.  Need to relay Dr. Synthia Innocent message.

## 2021-01-24 NOTE — Telephone Encounter (Signed)
Spoke with pt notifying her order was faxed.  Also, I relayed Dr. Synthia Innocent message.  Pt verbalizes understanding and expresses her thanks.

## 2021-01-24 NOTE — Telephone Encounter (Signed)
Pt returned Lisa's call. Can reach her at [phone].

## 2021-01-25 ENCOUNTER — Telehealth: Payer: Self-pay | Admitting: Family Medicine

## 2021-01-25 DIAGNOSIS — J9601 Acute respiratory failure with hypoxia: Secondary | ICD-10-CM | POA: Diagnosis not present

## 2021-01-25 DIAGNOSIS — J449 Chronic obstructive pulmonary disease, unspecified: Secondary | ICD-10-CM | POA: Diagnosis not present

## 2021-01-25 DIAGNOSIS — U071 COVID-19: Secondary | ICD-10-CM | POA: Diagnosis not present

## 2021-01-25 DIAGNOSIS — A0839 Other viral enteritis: Secondary | ICD-10-CM | POA: Diagnosis not present

## 2021-01-25 DIAGNOSIS — N183 Chronic kidney disease, stage 3 unspecified: Secondary | ICD-10-CM | POA: Diagnosis not present

## 2021-01-25 DIAGNOSIS — I5022 Chronic systolic (congestive) heart failure: Secondary | ICD-10-CM | POA: Diagnosis not present

## 2021-01-25 NOTE — Telephone Encounter (Signed)
Agree with this verbal order thanks.

## 2021-01-25 NOTE — Telephone Encounter (Signed)
Joy from [place] call in requesting Orders   Caller Name:Joy Agency Name: [employer]  Callback number: [phone]  Requesting OT/PT/Skilled nursing/Social Work/Speech:  Reason:PT  Frequency:  Please forward to [facility] pool or providers CMA

## 2021-01-26 NOTE — Telephone Encounter (Signed)
Called and spoke to [place]. Joy stated to disregard the order request because she is discharging the patient next week. Joy stated that patient is no longer homebound and is out driving around.

## 2021-01-26 NOTE — Telephone Encounter (Signed)
Noted  

## 2021-01-28 DIAGNOSIS — H2511 Age-related nuclear cataract, right eye: Secondary | ICD-10-CM | POA: Diagnosis not present

## 2021-02-01 DIAGNOSIS — N183 Chronic kidney disease, stage 3 unspecified: Secondary | ICD-10-CM | POA: Diagnosis not present

## 2021-02-01 DIAGNOSIS — J9601 Acute respiratory failure with hypoxia: Secondary | ICD-10-CM | POA: Diagnosis not present

## 2021-02-01 DIAGNOSIS — A0839 Other viral enteritis: Secondary | ICD-10-CM | POA: Diagnosis not present

## 2021-02-01 DIAGNOSIS — J449 Chronic obstructive pulmonary disease, unspecified: Secondary | ICD-10-CM | POA: Diagnosis not present

## 2021-02-01 DIAGNOSIS — I5022 Chronic systolic (congestive) heart failure: Secondary | ICD-10-CM | POA: Diagnosis not present

## 2021-02-01 DIAGNOSIS — U071 COVID-19: Secondary | ICD-10-CM | POA: Diagnosis not present

## 2021-02-07 ENCOUNTER — Encounter: Payer: Self-pay | Admitting: Family Medicine

## 2021-02-07 ENCOUNTER — Other Ambulatory Visit: Payer: Self-pay

## 2021-02-07 ENCOUNTER — Ambulatory Visit (INDEPENDENT_AMBULATORY_CARE_PROVIDER_SITE_OTHER): Payer: Medicare Other | Admitting: Family Medicine

## 2021-02-07 ENCOUNTER — Telehealth: Payer: Self-pay

## 2021-02-07 VITALS — BP 108/70 | HR 86 | Temp 98.3°F | Ht 65.0 in | Wt 186.2 lb

## 2021-02-07 DIAGNOSIS — R519 Headache, unspecified: Secondary | ICD-10-CM | POA: Diagnosis not present

## 2021-02-07 DIAGNOSIS — I5022 Chronic systolic (congestive) heart failure: Secondary | ICD-10-CM | POA: Diagnosis not present

## 2021-02-07 DIAGNOSIS — U071 COVID-19: Secondary | ICD-10-CM | POA: Diagnosis not present

## 2021-02-07 DIAGNOSIS — J9600 Acute respiratory failure, unspecified whether with hypoxia or hypercapnia: Secondary | ICD-10-CM | POA: Diagnosis not present

## 2021-02-07 DIAGNOSIS — R42 Dizziness and giddiness: Secondary | ICD-10-CM | POA: Insufficient documentation

## 2021-02-07 DIAGNOSIS — N1831 Chronic kidney disease, stage 3a: Secondary | ICD-10-CM | POA: Diagnosis not present

## 2021-02-07 LAB — CBC WITH DIFFERENTIAL/PLATELET
Basophils Absolute: 0.1 10*3/uL (ref 0.0–0.1)
Basophils Relative: 0.7 % (ref 0.0–3.0)
Eosinophils Absolute: 0.1 10*3/uL (ref 0.0–0.7)
Eosinophils Relative: 1.2 % (ref 0.0–5.0)
HCT: 39.4 % (ref 36.0–46.0)
Hemoglobin: 12.8 g/dL (ref 12.0–15.0)
Lymphocytes Relative: 18.5 % (ref 12.0–46.0)
Lymphs Abs: 2 10*3/uL (ref 0.7–4.0)
MCHC: 32.6 g/dL (ref 30.0–36.0)
MCV: 92.1 fl (ref 78.0–100.0)
Monocytes Absolute: 1 10*3/uL (ref 0.1–1.0)
Monocytes Relative: 9.2 % (ref 3.0–12.0)
Neutro Abs: 7.5 10*3/uL (ref 1.4–7.7)
Neutrophils Relative %: 70.4 % (ref 43.0–77.0)
Platelets: 243 10*3/uL (ref 150.0–400.0)
RBC: 4.27 Mil/uL (ref 3.87–5.11)
RDW: 13 % (ref 11.5–15.5)
WBC: 10.7 10*3/uL — ABNORMAL HIGH (ref 4.0–10.5)

## 2021-02-07 LAB — RENAL FUNCTION PANEL
Albumin: 4.5 g/dL (ref 3.5–5.2)
BUN: 21 mg/dL (ref 6–23)
CO2: 25 mEq/L (ref 19–32)
Calcium: 10.4 mg/dL (ref 8.4–10.5)
Chloride: 105 mEq/L (ref 96–112)
Creatinine, Ser: 0.89 mg/dL (ref 0.40–1.20)
GFR: 63.36 mL/min (ref >60.00)
Glucose, Bld: 105 mg/dL — ABNORMAL HIGH (ref 70–99)
Phosphorus: 4.1 mg/dL (ref 2.3–4.6)
Potassium: 4.5 mEq/L (ref 3.5–5.1)
Sodium: 141 mEq/L (ref 135–145)

## 2021-02-07 LAB — BRAIN NATRIURETIC PEPTIDE: Pro B Natriuretic peptide (BNP): 21 pg/mL (ref 0.0–100.0)

## 2021-02-07 LAB — TSH: TSH: 0.79 u[IU]/mL (ref 0.35–5.50)

## 2021-02-07 NOTE — Telephone Encounter (Signed)
[facility] - Client TELEPHONE ADVICE RECORD [facility] Patient Name: Ashley [name] Gender: Female DOB: 10/23/1945 Age: 75 Y 60 M 20 D Return Phone Number: [phone] (Primary) Address: City/ State/ Zip: [place] [address] Client [facility] - Client Client Site [place] Physician [name] - MD Contact Type Call Who Is Calling Patient / Member / Family / Caregiver Call Type Triage / Clinical Relationship To Patient Self Return Phone Number [phone] (Primary) Chief Complaint Dizziness Reason for Call Symptomatic / Request for Health Information Initial Comment Transferred from office. PT is having blurred vision, dizziness, headaches and feels like her head is full of fluid. Wanting to make appt for this week. Translation No Nurse Assessment Nurse: Verita Schneiders, RN, April Date/Time (Eastern Time): 02/07/2021 9:09:02 AM Confirm and document reason for call. If symptomatic, describe symptoms. ---Caller states she is having blurred vision, dizziness, headaches, and feels like her head is full of fluid. Symptoms began a long time ago-- dizzy spells becoming more frequent. Does not feel like vertigo. Headache (dull with occasional sharp pains). Had had vision changes since having COVID. Feels okay if she is sitting perfectly still. Does the patient have any new or worsening symptoms? ---Yes Will a triage be completed? ---Yes Related visit to physician within the last 2 weeks? ---No Does the PT have any chronic conditions? (i.e. diabetes, asthma, this includes High risk factors for pregnancy, etc.) ---Yes List chronic conditions. ---heart failure (weight is stable) , allergies, EF 25-30%, had COVID in July Is this a behavioral health or substance abuse call? ---No Guidelines Guideline Title Affirmed Question Affirmed Notes Nurse Date/Time (Eastern Time) Dizziness  - Lightheadedness [1] MODERATE dizziness (e.g., interferes with normal activities) Beams, RN, April 02/07/2021 9:13:36 AM PLEASE NOTE: All timestamps contained within this report are represented as [place] Standard Time. CONFIDENTIALTY NOTICE: This fax transmission is intended only for the addressee. It contains information that is legally privileged, confidential or otherwise protected from use or disclosure. If you are not the intended recipient, you are strictly prohibited from reviewing, disclosing, copying using or disseminating any of this information or taking any action in reliance on or regarding this information. If you have received this fax in error, please notify us immediately by telephone so that we can arrange for its return to [place]. Phone: [phone], Toll-Free: [phone], Fax: [phone] Page: 2 of 2 Call Id: [id number] Guidelines Guideline Title Affirmed Question Affirmed Notes Nurse Date/Time [name] Time) AND [2] has NOT been evaluated by physician for this (Exception: dizziness caused by heat exposure, sudden standing, or poor fluid intake) Disp. Time [name] Time) Disposition Final User 02/07/2021 9:19:25 AM See PCP within 24 Hours Yes Beams, RN, April Caller Disagree/Comply Comply Caller Understands Yes PreDisposition Call Doctor Care Advice Given Per Guideline SEE PCP WITHIN 24 HOURS: * IF OFFICE WILL BE OPEN: You need to be examined within the next 24 hours. Call your doctor (or NP/PA) when the office opens and make an appointment. DRINK FLUIDS: * Drink several glasses of fruit juice, other clear fluids or water. LIE DOWN AND REST: * Lie down with feet elevated for 1 hour. * This will improve circulation and increase blood flow to the brain. CALL BACK IF: * You become worse CARE ADVICE given per Dizziness (Adult) guideline. Comments User: April, Beams, RN Date/Time [name] Time): 02/07/2021 9:22:20 AM patient asked if I could call the line for her  to see about setting up  an appt; no answer from the backline. Instructed her to call the office back. Referrals REFERRED TO PCP OFFICE

## 2021-02-07 NOTE — Patient Instructions (Addendum)
Neurologic exam overall ok today.  Blood pressures maintain from laying down to standing up.  Continue good water intake.  Labs today for further evaluation of dizziness.

## 2021-02-07 NOTE — Telephone Encounter (Signed)
I spoke with pt; pt said these are not new symptoms but symptoms seem worse and more often.pt said is lightheaded any time standing or walking and pt does not feel stable when walking. Pt has appt in Oct for cataract surgery; no CP and no more SOB than usual;02/07/21 BP 116/70 P 110. No nausea. Dr Darnell Level said he would see pt today 02/07/21 at 12:30. Pt voiced understanding and will be at [facility] today at 12:15/ UC & ED precautions given and pt voiced understanding. Pt said already had covid questions earlier with [place]. Sending note to DR Baldwin Crown CMA.

## 2021-02-07 NOTE — Telephone Encounter (Signed)
Pt seen today

## 2021-02-07 NOTE — Progress Notes (Signed)
Patient ID: Ashley [name], female    DOB: 06/06/1945, 75 y.o.   MRN: [id number]  This visit was conducted in person.  BP 108/70   Pulse 86   Temp 98.3 F (36.8 C) (Temporal)   Ht 5\' 5"  (1.651 m)   Wt 186 lb 3.2 oz (84.5 kg)   SpO2 94%   BMI 30.99 kg/m   No data found.  Neg orthostatics - supine 118/70, standing 110/74.  BP Readings from Last 3 Encounters:  02/07/21 108/70  01/05/21 104/72  12/31/20 110/62    CC: headache, dizziness Subjective:   HPI: Ashley [name] is a 75 y.o. female presenting on 02/07/2021 for Headache and Dizziness (Lightheadedness. Thayer Jew of balance. )   Longstanding issue with dizziness, acutely worsened over the past few weeks, may have worsened after COVID. Also notes ongoing headache described as R parietal throbbing with radiation to temporal region. Sharp stabbing pain over eyes. No pressure/tightness, not worse with position changes. Better with laying supine. Denies h/o migraines.  No vertigo, nausea, hearing changes.  Occasional tinnitus.  Upcoming cataract surgery planned later this month. No other vision changes not related to cataracts. No new meds, supplements.   COVID infection 11/2020 s/p hospitalization for acute hypoxic resp failure and watery diarrhea possibly caused by Paxlovid. Started on nocturnal oxygen for hypoxia after COVID illness. She has been able to wean off oxygen during the day. Occasional use at night time. Interestingly she notes improvement in headache as well as muscle cramps when she uses supplemental oxygen.   Staying very active despite not feeling well.      Relevant past medical, surgical, family and social history reviewed and updated as indicated. Interim medical history since our last visit reviewed. Allergies and medications reviewed and updated. Outpatient Medications Prior to Visit  Medication Sig Dispense Refill   acetaminophen (TYLENOL) 500 MG tablet Take 500 mg by mouth daily as needed for  moderate pain or headache (PAIN).      AMBULATORY NON FORMULARY MEDICATION Medication Name:  1 squatty potty to use as needed 1 each 0   aspirin EC 81 MG tablet Take 81 mg by mouth at bedtime.      beclomethasone (QVAR REDIHALER) 80 MCG/ACT inhaler Inhale 1 puff into the lungs 2 (two) times daily. 1 each 2   benzonatate (TESSALON) 200 MG capsule Take 1 capsule (200 mg total) by mouth 3 (three) times daily as needed for cough. 30 capsule 6   Biotin 10 MG CAPS Take 1 tablet by mouth daily.     carvedilol (COREG) 6.25 MG tablet Take 0.5 tablets (3.125 mg total) by mouth 2 (two) times daily with a meal. 90 tablet 1   cholecalciferol (VITAMIN D) 1000 units tablet Take 1,000 Units by mouth daily.      Coenzyme Q10 (COQ-10) 100 MG CAPS Take 100 mg by mouth daily.     dextromethorphan (DELSYM) 30 MG/5ML liquid Take 15 mg by mouth daily as needed for cough.      ezetimibe (ZETIA) 10 MG tablet Take 1 tablet (10 mg total) by mouth daily. 90 tablet 3   fluorouracil (EFUDEX) 5 % cream Apply topically.     gabapentin (NEURONTIN) 100 MG capsule Take 1 capsule (100 mg total) by mouth 2 (two) times daily. Take one nightly for the first 4 days 60 capsule 3   GARLIC PO Take 1 capsule by mouth daily.     guaiFENesin (MUCINEX) 600 MG 12 hr tablet Take 600 mg  by mouth 2 (two) times daily.     levalbuterol (XOPENEX HFA) 45 MCG/ACT inhaler Inhale 2 puffs into the lungs every 6 (six) hours as needed for wheezing or shortness of breath. 15 g 3   LORazepam (ATIVAN) 0.5 MG tablet Take 0.5-1 tablets (0.25-0.5 mg total) by mouth at bedtime as needed for anxiety or sleep. 30 tablet 1   magnesium 30 MG tablet Take 30 mg by mouth daily.     Menthol, Topical Analgesic, (BIOFREEZE ROLL-ON EX) Apply topically.     miconazole (MICOTIN) 2 % powder Apply 1 application topically as needed (yeast).     nystatin (MYCOSTATIN) 100000 UNIT/ML suspension TAKE 5 MLS (500,000 UNITS TOTAL) BY MOUTH 3 (THREE) TIMES DAILY. 120 mL 1   nystatin  cream (MYCOSTATIN) Apply 1 application topically as needed for dry skin (yeast infection). 30 g 3   Omega-3 Fatty Acids (FISH OIL) 1000 MG CAPS Take 1 capsule (1,000 mg total) by mouth daily.  0   pantoprazole (PROTONIX) 40 MG tablet Take 1 tablet (40 mg total) by mouth every Monday, Wednesday, and Friday.     potassium chloride (KLOR-CON) 10 MEQ tablet Take 1 tablet (10 mEq total) by mouth 3 (three) times a week. 40 tablet 1   pyridOXINE (VITAMIN B-6) 100 MG tablet Take 100 mg by mouth daily.     Red Yeast Rice Extract (RED YEAST RICE PO) Take 1 capsule by mouth in the morning and at bedtime.     sacubitril-valsartan (ENTRESTO) 49-51 MG Take 1 tablet by mouth daily.     spironolactone (ALDACTONE) 25 MG tablet Take 1 tablet (25 mg total) by mouth daily. 90 tablet 1   torsemide (DEMADEX) 10 MG tablet Take 1 tablet (10 mg total) by mouth 3 (three) times a week. 40 tablet 1   triamcinolone (NASACORT) 55 MCG/ACT AERO nasal inhaler Place into the nose.     vitamin E 400 UNIT capsule Take 400 Units by mouth daily.     Ascorbic Acid (VITAMIN C) 1000 MG tablet Take 1,000 mg by mouth See admin instructions. Take 1000 mg daily, may take an additional 1000 mg as needed for cold symptoms     No facility-administered medications prior to visit.     Per HPI unless specifically indicated in ROS section below Review of Systems  Objective:  BP 108/70   Pulse 86   Temp 98.3 F (36.8 C) (Temporal)   Ht 5\' 5"  (1.651 m)   Wt 186 lb 3.2 oz (84.5 kg)   SpO2 94%   BMI 30.99 kg/m   Wt Readings from Last 3 Encounters:  02/07/21 186 lb 3.2 oz (84.5 kg)  01/05/21 184 lb (83.5 kg)  12/31/20 183 lb 9 oz (83.3 kg)      Physical Exam Vitals and nursing note reviewed.  Constitutional:      Appearance: Normal appearance. She is not ill-appearing.  HENT:     Head: Normocephalic and atraumatic.     Right Ear: Hearing, tympanic membrane, ear canal and external ear normal.     Left Ear: Hearing, tympanic  membrane, ear canal and external ear normal.     Nose: Nose normal.     Right Turbinates: Not enlarged or swollen.     Left Turbinates: Not enlarged or swollen.     Right Sinus: No maxillary sinus tenderness or frontal sinus tenderness.     Left Sinus: No maxillary sinus tenderness or frontal sinus tenderness.     Mouth/Throat:  Mouth: Mucous membranes are moist.     Pharynx: Oropharynx is clear. No pharyngeal swelling, posterior oropharyngeal erythema or uvula swelling.  Cardiovascular:     Rate and Rhythm: Normal rate and regular rhythm.     Pulses: Normal pulses.     Heart sounds: Normal heart sounds. No murmur heard. Pulmonary:     Effort: Pulmonary effort is normal. No respiratory distress.     Breath sounds: Normal breath sounds. No wheezing, rhonchi or rales.  Musculoskeletal:     Cervical back: Normal range of motion and neck supple.     Right lower leg: No edema.     Left lower leg: No edema.  Lymphadenopathy:     Cervical: No cervical adenopathy.  Skin:    General: Skin is warm and dry.     Findings: No rash.  Neurological:     Mental Status: She is alert.     Cranial Nerves: Cranial nerves are intact.     Sensory: Sensation is intact.     Motor: Motor function is intact.     Coordination: Coordination is intact.     Gait: Gait is intact.     Comments:  CN 2-12 intact FTN intact EOMI Dix Hallpike bilaterally negative.   Psychiatric:        Mood and Affect: Mood normal.        Behavior: Behavior normal.      Results for orders placed or performed in visit on 02/07/21  CBC with Differential/Platelet  Result Value Ref Range   WBC 10.7 (H) 4.0 - 10.5 K/uL   RBC 4.27 3.87 - 5.11 Mil/uL   Hemoglobin 12.8 12.0 - 15.0 g/dL   HCT 39.4 36.0 - 46.0 %   MCV 92.1 78.0 - 100.0 fl   MCHC 32.6 30.0 - 36.0 g/dL   RDW 13.0 11.5 - 15.5 %   Platelets 243.0 150.0 - 400.0 K/uL   Neutrophils Relative % 70.4 43.0 - 77.0 %   Lymphocytes Relative 18.5 12.0 - 46.0 %    Monocytes Relative 9.2 3.0 - 12.0 %   Eosinophils Relative 1.2 0.0 - 5.0 %   Basophils Relative 0.7 0.0 - 3.0 %   Neutro Abs 7.5 1.4 - 7.7 K/uL   Lymphs Abs 2.0 0.7 - 4.0 K/uL   Monocytes Absolute 1.0 0.1 - 1.0 K/uL   Eosinophils Absolute 0.1 0.0 - 0.7 K/uL   Basophils Absolute 0.1 0.0 - 0.1 K/uL  TSH  Result Value Ref Range   TSH 0.79 0.35 - 5.50 uIU/mL  Renal function panel  Result Value Ref Range   Sodium 141 135 - 145 mEq/L   Potassium 4.5 3.5 - 5.1 mEq/L   Chloride 105 96 - 112 mEq/L   CO2 25 19 - 32 mEq/L   Albumin 4.5 3.5 - 5.2 g/dL   BUN 21 6 - 23 mg/dL   Creatinine, Ser 0.89 0.40 - 1.20 mg/dL   Glucose, Bld 105 (H) 70 - 99 mg/dL   Phosphorus 4.1 2.3 - 4.6 mg/dL   GFR 63.36 >60.00 mL/min   Calcium 10.4 8.4 - 10.5 mg/dL  Brain natriuretic peptide  Result Value Ref Range   Pro B Natriuretic peptide (BNP) 21.0 0.0 - 100.0 pg/mL   *Note: Due to a large number of results and/or encounters for the requested time period, some results have not been displayed. A complete set of results can be found in Results Review.    Assessment & Plan:  This visit occurred during the SARS-CoV-2  public health emergency.  Safety protocols were in place, including screening questions prior to the visit, additional usage of staff PPE, and extensive cleaning of exam room while observing appropriate contact time as indicated for disinfecting solutions.   Problem List Items Addressed This Visit     Chronic systolic CHF (congestive heart failure) (HCC)   Relevant Orders   TSH (Completed)   Brain natriuretic peptide (Completed)   CKD (chronic kidney disease) stage 3, GFR 30-59 ml/min (HCC)   Relevant Orders   Renal function panel (Completed)   Nonintractable episodic headache    Not consistent with migraine or sinus headache. nonfocal neurological exam. Check labwork today.       Acute respiratory failure due to COVID-19 [facility])    This has largely resolved, now off regular supplemental oxygen  use.      Dizziness - Primary    Dizziness described as lightheadedness with occasional room spinning, without presyncope. Orthostatics negative in office today. Dix hallpike also negative. Non-focal neurological exam.  Recently saw cardiologist - stable period. No recent med changes, BP remains stable.  Unclear cause - will check labwork today. ?residual post-COVID sequelae.       Relevant Orders   CBC with Differential/Platelet (Completed)   TSH (Completed)   Brain natriuretic peptide (Completed)     No orders of the defined types were placed in this encounter.  Orders Placed This Encounter  Procedures   CBC with Differential/Platelet   TSH   Renal function panel   Brain natriuretic peptide    Patient Instructions  Neurologic exam overall ok today.  Blood pressures maintain from laying down to standing up.  Continue good water intake.  Labs today for further evaluation of dizziness.    Follow up plan: Return if symptoms worsen or fail to improve.  [name], MD

## 2021-02-08 ENCOUNTER — Telehealth: Payer: Self-pay

## 2021-02-08 NOTE — Telephone Encounter (Signed)
I called [employer] to get help with the pt monitor. They are sending her a new monitor in 7-10 business days.

## 2021-02-09 NOTE — Assessment & Plan Note (Addendum)
Dizziness described as lightheadedness with occasional room spinning, without presyncope. Orthostatics negative in office today. Dix hallpike also negative. Non-focal neurological exam.  Recently saw cardiologist - stable period. No recent med changes, BP remains stable.  Unclear cause - will check labwork today. ?residual post-COVID sequelae.

## 2021-02-09 NOTE — Assessment & Plan Note (Signed)
Not consistent with migraine or sinus headache. nonfocal neurological exam. Check labwork today.

## 2021-02-09 NOTE — Assessment & Plan Note (Signed)
This has largely resolved, now off regular supplemental oxygen use.

## 2021-02-10 ENCOUNTER — Ambulatory Visit (HOSPITAL_COMMUNITY)
Admission: RE | Admit: 2021-02-10 | Discharge: 2021-02-10 | Disposition: A | Discharge status: Home / Self Care | Payer: Medicare Other | Source: Ambulatory Visit | Attending: Internal Medicine | Admitting: Internal Medicine

## 2021-02-10 ENCOUNTER — Other Ambulatory Visit: Payer: Self-pay

## 2021-02-10 VITALS — BP 120/70 | HR 94 | Wt 186.6 lb

## 2021-02-10 DIAGNOSIS — I340 Nonrheumatic mitral (valve) insufficiency: Secondary | ICD-10-CM | POA: Insufficient documentation

## 2021-02-10 DIAGNOSIS — Z6831 Body mass index (BMI) 31.0-31.9, adult: Secondary | ICD-10-CM | POA: Insufficient documentation

## 2021-02-10 DIAGNOSIS — M797 Fibromyalgia: Secondary | ICD-10-CM | POA: Diagnosis not present

## 2021-02-10 DIAGNOSIS — Z883 Allergy status to other anti-infective agents status: Secondary | ICD-10-CM | POA: Insufficient documentation

## 2021-02-10 DIAGNOSIS — D472 Monoclonal gammopathy: Secondary | ICD-10-CM | POA: Insufficient documentation

## 2021-02-10 DIAGNOSIS — I447 Left bundle-branch block, unspecified: Secondary | ICD-10-CM | POA: Diagnosis not present

## 2021-02-10 DIAGNOSIS — R42 Dizziness and giddiness: Secondary | ICD-10-CM | POA: Diagnosis not present

## 2021-02-10 DIAGNOSIS — I110 Hypertensive heart disease with heart failure: Secondary | ICD-10-CM | POA: Diagnosis not present

## 2021-02-10 DIAGNOSIS — K219 Gastro-esophageal reflux disease without esophagitis: Secondary | ICD-10-CM | POA: Diagnosis not present

## 2021-02-10 DIAGNOSIS — Z888 Allergy status to other drugs, medicaments and biological substances status: Secondary | ICD-10-CM | POA: Insufficient documentation

## 2021-02-10 DIAGNOSIS — Z8719 Personal history of other diseases of the digestive system: Secondary | ICD-10-CM | POA: Insufficient documentation

## 2021-02-10 DIAGNOSIS — J449 Chronic obstructive pulmonary disease, unspecified: Secondary | ICD-10-CM | POA: Insufficient documentation

## 2021-02-10 DIAGNOSIS — Z79899 Other long term (current) drug therapy: Secondary | ICD-10-CM | POA: Diagnosis not present

## 2021-02-10 DIAGNOSIS — Z95810 Presence of automatic (implantable) cardiac defibrillator: Secondary | ICD-10-CM

## 2021-02-10 DIAGNOSIS — Z887 Allergy status to serum and vaccine status: Secondary | ICD-10-CM | POA: Insufficient documentation

## 2021-02-10 DIAGNOSIS — Z4502 Encounter for adjustment and management of automatic implantable cardiac defibrillator: Secondary | ICD-10-CM | POA: Insufficient documentation

## 2021-02-10 DIAGNOSIS — I5022 Chronic systolic (congestive) heart failure: Secondary | ICD-10-CM | POA: Insufficient documentation

## 2021-02-10 DIAGNOSIS — Z7722 Contact with and (suspected) exposure to environmental tobacco smoke (acute) (chronic): Secondary | ICD-10-CM | POA: Diagnosis not present

## 2021-02-10 DIAGNOSIS — Z7951 Long term (current) use of inhaled steroids: Secondary | ICD-10-CM | POA: Insufficient documentation

## 2021-02-10 DIAGNOSIS — Z8616 Personal history of COVID-19: Secondary | ICD-10-CM | POA: Diagnosis not present

## 2021-02-10 DIAGNOSIS — Z7982 Long term (current) use of aspirin: Secondary | ICD-10-CM | POA: Insufficient documentation

## 2021-02-10 DIAGNOSIS — Z885 Allergy status to narcotic agent status: Secondary | ICD-10-CM | POA: Diagnosis not present

## 2021-02-10 NOTE — Patient Instructions (Signed)
Good to see you today  No medication changes  Your physician recommends that you schedule a follow-up appointment in: 6 months Call our office in February to schedule an appointment If you have any questions or concerns before your next appointment please send [place] a message through [place] or call our office at [phone].    TO LEAVE A MESSAGE FOR THE NURSE SELECT OPTION 2, PLEASE LEAVE A MESSAGE INCLUDING: YOUR NAME DATE OF BIRTH CALL BACK NUMBER REASON FOR CALL**this is important as we prioritize the call backs  YOU WILL RECEIVE A CALL BACK THE SAME DAY AS LONG AS YOU CALL BEFORE 4:00 PM At the [facility], you and your health needs are our priority. As part of our continuing mission to provide you with exceptional heart care, we have created designated Provider Care Teams. These Care Teams include your primary Cardiologist (physician) and Advanced Practice Providers (APPs- Physician Assistants and Nurse Practitioners) who all work together to provide you with the care you need, when you need it.   You may see any of the following providers on your designated Care Team at your next follow up: Dr Glori Bickers Dr Loralie Champagne Dr Patrice Paradise, NP Lyda Jester, [place] Ginnie Smart Audry Riles, PharmD   Please be sure to bring in all your medications bottles to every appointment.

## 2021-02-10 NOTE — Progress Notes (Signed)
ADVANCED HF CLINIC NOTE  Referring Physician: Dr. [name] Primary Care: [name], MD Primary Cardiologist: Dr. [name] [facility]: Dr. [name]   Reason for Visit: F/u for chronic systolic heart failure/ NICM   HPI:  Ashley [name] is a 75 year old with a history of morbid obesity, HTN, COPD, GERD , fibromyaligia, s/p partial colon resection for diverticulitis with re-connection, MGUS, LBBB and systolic HF due to NICM s/p CRT-D referred by Dr. [name] for further evaluation of her HF.  Has h/o NICM dating back to 2012 when she had PNA October 2012 ejection fraction  25-35%,  September 2013 ejection fraction 30-35%.  EF in 2019 had recovered to 60-65% but now EF is back down to 15-20%.    Admitted 6/20 with increased dyspnea and chest pain. Prior to admit she had been treated with levaquin + prednisone for bronchitis. CXR was concerning for interstitial edema. Bedside ECHO performed in the ED showed severely reduced LVEF-->15% and severe MR. Placed on IV lasix and set up for cath. In 6/20 had RHC/LHC with normal coronaries, EF 20-25%, and mildly elevated filling pressures. Renal function has been stable. Underwent upgrade to CRT-D  Last echo 10/21: EF 25%  In July, she contracted COVID and was placed on paxlovid and subsequently developed diarrhea and was admitted for subsequent dehydration and AKI. Received IVFs and meds temporarily held but able to resume HF regimen. Also w/ hypoxia. D-dimer was elevated by chest CT negative for PE. Hypoxia felt 2/2 COVID and sent home w/ home O2.   She had f/u in cardiology clinic 8/22 and echo repeated, showing LVEF 30%. RV normal.   Presents to [facility] today for routien f/u. Here w/ husband. Continues w/ NYHA Class II symptoms. Has been able to wean off home O2. Checks O2 sats at home. 96% on RA during today's visit. Her wt is fairly stable since her last visit in Feb, only up 2 lb from 184>>186 lb. BP well controlled. Reports occasional positional  dizziness and dry cough. Did not tolerate [place]. Saw PCP earlier this week on 9/26 and had labs. SCr 0.89. K 4.5. BNP also normal at 21.   Device interrogation today shows good volume status, index and impedence ok. No AT/AF. No VT/VF. 100 V pacing. Good activity level, 3-4 hr/day. Personally reviewed.    Review of systems complete and found to be negative unless listed in HPI.     Cardiac Studies  RHC/LHC 10/16/2018  LVEF 20-25. Normal coronaries RA 8 PCWP 21 CO 4.45 CI 2.35    ECHO 10/14/2018 EF 15-20%. RV normal, Severe central mitral valve regurgitation.     1. The left ventricle has a 2D calculated ejection fraction 15-20%%. The cavity size was mildly dilated. Left ventricular diastolic Doppler parameters are indeterminate.  2. The right ventricle has normal systolic function. The cavity was normal. There is no increase in right ventricular wall thickness.  3. Left atrial size was moderately dilated.  4. The mitral valve is rheumatic. Moderate thickening of the mitral valve leaflet. There is mild to moderate mitral annular calcification present. Mitral valve regurgitation is severe by color flow Doppler. The MR jet is centrally-directed.  Echo 8/22: EF 30%, RV normal    Past Medical History:  Diagnosis Date   Abdominal aortic atherosclerosis ([place]) 11/2015   by CT   Allergy    Anemia    Anxiety    Automatic implantable cardioverter-defibrillator in situ    a. 2012 s/p MDT single lead ICD; b. 10/2018  BiV upgrade-->MDT ser # [id number] H.   Bronchiectasis    a. possibly mild, treat URIs aggressively   Cancer ([place])    basal cell, arms & neck   Chronic sinusitis    COPD (chronic obstructive pulmonary disease) (HCC)    Depression    Dyspnea    Essential hypertension    Pt denies   Family history of adverse reaction to anesthesia    Mother - PONV   Fibrocystic breast disease    Fibromyalgia    GERD (gastroesophageal reflux disease)    H/O multiple allergies 10/10/2013    Hemorrhoids    HFrEF (heart failure with reduced ejection fraction) ([place])    a. 06/2011 cMRI: EF 34%, no LGE; b. 01/2016 Echo: EF 35-40%; c. 07/2016 Echo: EF 40-45%; d. 02/2020 Echo: EF 20-25%, glob HK. GrI DD. Mildly red RV fxn. Mildly dil LA. Triv MR.   History of diverticulitis of colon    History of pneumonia 2012   Insomnia    Irritable bowel syndrome    Laryngeal nodule    Dr. Thomasena Edis   Migraine without aura, without mention of intractable migraine without mention of status migrainosus    Mitral regurgitation    Nonischemic cardiomyopathy ([place])    a. 2012 s/p single lead ICD; b. 06/2011 cMRI: EF 34%, no LGE; b. 01/2016 Echo: EF 35-40%; c. 07/2016 Echo: EF 40-45%; d. 10/2018 Cath: nl cors. EF 20-25%; e. 10/2018 s/p BiV MDT ICD upgrade; f. 02/2020 Echo: EF 20-25%, glob HK. GrI DD.   Osteoarthritis    knees, back, hands, low back,    Osteoporosis 01/2013   T-2.7 spine (01/2016)   PONV (postoperative nausea and vomiting)    sigmoid diverticulitis with perforation, abscess and fistula 09/30/2013   Sleep apnea 2010   borderline, inconclusive- /w [place]     Current Outpatient Medications  Medication Sig Dispense Refill   acetaminophen (TYLENOL) 500 MG tablet Take 500 mg by mouth daily as needed for moderate pain or headache (PAIN).      AMBULATORY NON FORMULARY MEDICATION Medication Name:  1 squatty potty to use as needed 1 each 0   Ascorbic Acid (VITAMIN C) 1000 MG tablet Take 1,000 mg by mouth See admin instructions. Take 1000 mg daily, may take an additional 1000 mg as needed for cold symptoms     aspirin EC 81 MG tablet Take 81 mg by mouth at bedtime.      beclomethasone (QVAR) 80 MCG/ACT inhaler Inhale 1 puff into the lungs daily.     benzonatate (TESSALON) 200 MG capsule Take 1 capsule (200 mg total) by mouth 3 (three) times daily as needed for cough. 30 capsule 6   Biotin 10 MG CAPS Take 1 tablet by mouth daily.     carvedilol (COREG) 6.25 MG tablet Take 0.5 tablets (3.125 mg total)  by mouth 2 (two) times daily with a meal. 90 tablet 1   cholecalciferol (VITAMIN D) 1000 units tablet Take 1,000 Units by mouth daily.      Coenzyme Q10 (COQ-10) 100 MG CAPS Take 100 mg by mouth daily.     dextromethorphan (DELSYM) 30 MG/5ML liquid Take 15 mg by mouth daily as needed for cough.      ezetimibe (ZETIA) 10 MG tablet Take 1 tablet (10 mg total) by mouth daily. 90 tablet 3   fluorouracil (EFUDEX) 5 % cream Apply topically.     GARLIC PO Take 1 capsule by mouth daily.     guaiFENesin (MUCINEX) 600 MG 12  hr tablet Take 600 mg by mouth 2 (two) times daily.     levalbuterol (XOPENEX HFA) 45 MCG/ACT inhaler Inhale 2 puffs into the lungs every 6 (six) hours as needed for wheezing or shortness of breath. 15 g 3   LORazepam (ATIVAN) 0.5 MG tablet Take 0.5-1 tablets (0.25-0.5 mg total) by mouth at bedtime as needed for anxiety or sleep. 30 tablet 1   magnesium 30 MG tablet Take 30 mg by mouth daily.     Menthol, Topical Analgesic, (BIOFREEZE ROLL-ON EX) Apply topically.     miconazole (MICOTIN) 2 % powder Apply 1 application topically as needed (yeast).     nystatin cream (MYCOSTATIN) Apply 1 application topically as needed for dry skin (yeast infection). 30 g 3   Omega-3 Fatty Acids (FISH OIL) 1000 MG CAPS Take 1 capsule (1,000 mg total) by mouth daily.  0   pantoprazole (PROTONIX) 40 MG tablet Take 1 tablet (40 mg total) by mouth every Monday, Wednesday, and Friday.     potassium chloride (KLOR-CON) 10 MEQ tablet Take 1 tablet (10 mEq total) by mouth 3 (three) times a week. 40 tablet 1   pyridOXINE (VITAMIN B-6) 100 MG tablet Take 100 mg by mouth daily.     sacubitril-valsartan (ENTRESTO) 49-51 MG Take 1 tablet by mouth daily.     spironolactone (ALDACTONE) 25 MG tablet Take 1 tablet (25 mg total) by mouth daily. 90 tablet 1   torsemide (DEMADEX) 10 MG tablet Take 1 tablet (10 mg total) by mouth 3 (three) times a week. 40 tablet 1   triamcinolone (NASACORT) 55 MCG/ACT AERO nasal inhaler  Place into the nose.     vitamin E 400 UNIT capsule Take 400 Units by mouth daily.     Red Yeast Rice Extract (RED YEAST RICE PO) Take 1 capsule by mouth in the morning and at bedtime.     No current facility-administered medications for this encounter.    Allergies  Allergen Reactions   Atorvastatin Other (See Comments)    Severe muscle cramps to generic atorvastatin.  Able to take brand lipitor.   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    Influenza Vac Split [Influenza Virus Vaccine] Other (See Comments)    "allergic to concentrated eggs that are in vaccine"   Latex Nausea Only, Rash and Other (See Comments)    Pain and infection   Morphine And Related Other (See Comments)    Internally feels like she is on fire    Pravastatin Other (See Comments)    myalgias   Prevacid [Lansoprazole] Other (See Comments)    Worsened GI side effects   Prevnar [Pneumococcal 13-Val Conj Vacc] Other (See Comments)    Egg allergy Bad reaction after shot   Rosuvastatin Other (See Comments)    Was not effective controlling lipids   Simvastatin Other (See Comments)    Leg cramps   Tape  Other (See Comments) and Rash    All adhesives  - Blisters, itching and burning  blistering   Farxiga [Dapagliflozin]    Metaxalone    Tramadol     Leg cramps   Antihistamines, Chlorpheniramine-Type Other (See Comments)    Alters vision   Betadine [Povidone Iodine] Rash    Per pt.     Onion Diarrhea and Other (See Comments)    GI distress, flared IBS   Other Hives and Rash    NOTE: pt is able to take cephalosporins without reaction   Penicillins Other (See Comments)    CHILDHOOD ALLEGY/REACTION: "told 50 years ago I couldn't take it; might be immune to it", able to take amoxicillin Has patient had a PCN reaction causing immediate rash, facial/tongue/throat swelling, SOB or lightheadedness with hypotension: Unknown Has patient had a PCN reaction causing severe rash involving mucus membranes or skin necrosis: UnknowN Has patient had a PCN reaction that required hospitalization: /Unknown Has patient had a PCN reaction occurring within the last 10 years: Unknown If a   [place] Nausea Only and Swelling   Sertraline Other (See Comments)    Overly sedating   Sulfasalazine Other (See Comments)    "makes my whole body smell like sulfa & makes me sick just smelling it"      Social History   Socioeconomic History   Marital status: Married    Spouse name: Not on file   Number of children: 1   Years of education: Not on file   Highest education level: Not on file  Occupational History   Occupation: Oncologist   Occupation: Aeronautical engineer  Tobacco Use   Smoking status: Never   Smokeless tobacco: Never   Tobacco comments:    Lived with smokers x 23 yrs, smoked herself "for a week"  Vaping Use   Vaping Use: Never used  Substance and Sexual Activity   Alcohol use: No    Alcohol/week: 0.0 standard drinks   Drug use: No   Sexual activity: Not Currently  Other Topics Concern   Not on file  Social History Narrative   Lives with husband Herbie [place]   1 adopted child   Social Determinants of Radio broadcast assistant Strain: Not on file  Food Insecurity: Not on file  Transportation Needs: Not on file  Physical Activity: Not on file  Stress: Not on file  Social Connections: Not on file  Intimate Partner Violence: Not on file       Family History  Problem Relation Age of Onset   Lung cancer Father        + smoker   Colon polyps Father    Dementia Mother    Osteoporosis Mother        Lumbar spine   Stroke Mother        x 5 @ 18 YOA   Arthritis Mother        hands   Emphysema Mother    Alcohol abuse Paternal Grandfather    Stroke Paternal Grandfather        ?   Heart disease Paternal Grandfather    Hypertension Maternal Grandfather        ?   Heart disease Paternal Grandmother    Colon cancer Neg Hx    Esophageal cancer Neg Hx    Gallbladder disease Neg Hx     Vitals:   02/10/21 1354  BP: 120/70  Pulse: 94  SpO2: 96%  Weight: 84.6  kg (186 lb 9.6 oz)     PHYSICAL EXAM: General:  Well appearing. No respiratory difficulty HEENT: normal Neck: supple. no JVD. Carotids 2+ bilat; no bruits. No lymphadenopathy or thyromegaly appreciated. Cor: PMI nondisplaced. Regular rate & rhythm. No rubs, gallops or murmurs. Lungs: clear Abdomen: soft, nontender, nondistended. No hepatosplenomegaly. No bruits or masses. Good bowel sounds. Extremities: no cyanosis, clubbing, rash, edema Neuro: alert & oriented x 3, cranial nerves grossly intact. moves all 4 extremities w/o difficulty. Affect pleasant.   ASSESSMENT & PLAN:  1. Chronic systolic HF - h/o NICM dating back to 2012 with fluctuating EF. EF improved to 60% in 2019 but then dropped again  - Echo 6/20 EF 20%  - LHC/RHC 6/20 with normal coronaries and mildly elevated filling pressures.  - Underwent CRT-D upgrade 6/20 but EF has not improved - Echo 10/21 EF 25% - Echo 8/22 EF 30%, RV normal  - NYHA II - Volume status ok on exam and Optivol. C/w  torsemide 10 mg MWF - She is on a very good GDMT regimen - Continue Entresto 49/51 bid. Will not increase given dizziness - Continue carvedilol 3.125 bid - Continue spiro 25 daily - Did not tolerate [place]  - Encouraged to work on increasing physical activity. We discussed CPX testing but feel that  this is not warranted yet.  - Reviewed labs checked at PCP office this week. SCr and K WNL on current regimen.   F/u in 6 months.   Lyda Jester, PA-C  1:58 PM  Patient seen and examined with the above-signed Advanced Practice Provider and/or Housestaff. I personally reviewed laboratory data, imaging studies and relevant notes. I independently examined the patient and formulated the important aspects of the plan. I have edited the note to reflect any of my changes or salient points. I have personally discussed the plan with the patient and/or family.  Overall doing ok. Still has some post-COVID symptoms but able to work in yard and do other activities without too much problem. Volume status ok. On good meds.   General:  Well appearing. No resp difficulty HEENT: normal Neck: supple. no JVD. Carotids 2+ bilat; no bruits. No lymphadenopathy or thryomegaly appreciated. Cor: PMI nondisplaced. Regular rate & rhythm. No rubs, gallops or murmurs. Lungs: clear Abdomen: obese soft, nontender, nondistended. No hepatosplenomegaly. No bruits or masses. Good bowel sounds. Extremities: no cyanosis, clubbing, rash, edema Neuro: alert & orientedx3, cranial nerves grossly intact. moves all 4 extremities w/o difficulty. Affect pleasant  Stable NYHA II-III. Volume status ok. On good meds. ICD interrogation with no AF/VT. Volume activity ok. Activity level 3 hr/day. We discussed possible CPX but given current activity level doubt it would change management much.   Glori Bickers, MD  4:29 PM

## 2021-02-14 ENCOUNTER — Ambulatory Visit: Payer: Medicare Other | Admitting: Family Medicine

## 2021-02-14 ENCOUNTER — Telehealth: Payer: Self-pay

## 2021-02-14 NOTE — Chronic Care Management (AMB) (Addendum)
Chronic Care Management Pharmacy Assistant   Name: Ashley [name]  MRN: [id number] DOB: January 06, 1946   Reason for Encounter: General Adherence   Recent office visits:  02/07/21-PCP-Patient presented for HA and dizziness. Labs ordered (kidney function stable, blood counts returned ok, currently off supplemental oxygen). No medication changes. 12/31/20-PCP-Patient presented for nocturnal hypoxia. She has started supplemental oxygen. Sent request to [facility] for humidifier. Started benzonatate 200mg  take 1 capsule 3 times daily for cough. Consider cutting either entresto or spironolactone in half when your blood pressures drop to 80-90s. Touch base with cardiology about ongoing oxygen need and possible heart ultrasound. Follow up 2-3 months.  Recent consult visits:  01/05/21-Cardiology-Patient presented for follow up heart failure.Review of EKG,previous labwork,no medication changes follow up 3 months.  Hospital visits:  None in previous 6 months  Medications: Outpatient Encounter Medications as of 02/14/2021  Medication Sig   acetaminophen (TYLENOL) 500 MG tablet Take 500 mg by mouth daily as needed for moderate pain or headache (PAIN).    AMBULATORY NON FORMULARY MEDICATION Medication Name:  1 squatty potty to use as needed   Ascorbic Acid (VITAMIN C) 1000 MG tablet Take 1,000 mg by mouth See admin instructions. Take 1000 mg daily, may take an additional 1000 mg as needed for cold symptoms   aspirin EC 81 MG tablet Take 81 mg by mouth at bedtime.    beclomethasone (QVAR) 80 MCG/ACT inhaler Inhale 1 puff into the lungs daily.   benzonatate (TESSALON) 200 MG capsule Take 1 capsule (200 mg total) by mouth 3 (three) times daily as needed for cough.   Biotin 10 MG CAPS Take 1 tablet by mouth daily.   carvedilol (COREG) 6.25 MG tablet Take 0.5 tablets (3.125 mg total) by mouth 2 (two) times daily with a meal.   cholecalciferol (VITAMIN D) 1000 units tablet Take 1,000 Units by mouth daily.     Coenzyme Q10 (COQ-10) 100 MG CAPS Take 100 mg by mouth daily.   dextromethorphan (DELSYM) 30 MG/5ML liquid Take 15 mg by mouth daily as needed for cough.    ezetimibe (ZETIA) 10 MG tablet Take 1 tablet (10 mg total) by mouth daily.   fluorouracil (EFUDEX) 5 % cream Apply topically.   GARLIC PO Take 1 capsule by mouth daily.   guaiFENesin (MUCINEX) 600 MG 12 hr tablet Take 600 mg by mouth 2 (two) times daily.   levalbuterol (XOPENEX HFA) 45 MCG/ACT inhaler Inhale 2 puffs into the lungs every 6 (six) hours as needed for wheezing or shortness of breath.   LORazepam (ATIVAN) 0.5 MG tablet Take 0.5-1 tablets (0.25-0.5 mg total) by mouth at bedtime as needed for anxiety or sleep.   magnesium 30 MG tablet Take 30 mg by mouth daily.   Menthol, Topical Analgesic, (BIOFREEZE ROLL-ON EX) Apply topically.   miconazole (MICOTIN) 2 % powder Apply 1 application topically as needed (yeast).   nystatin cream (MYCOSTATIN) Apply 1 application topically as needed for dry skin (yeast infection).   Omega-3 Fatty Acids (FISH OIL) 1000 MG CAPS Take 1 capsule (1,000 mg total) by mouth daily.   pantoprazole (PROTONIX) 40 MG tablet Take 1 tablet (40 mg total) by mouth every Monday, Wednesday, and Friday.   potassium chloride (KLOR-CON) 10 MEQ tablet Take 1 tablet (10 mEq total) by mouth 3 (three) times a week.   pyridOXINE (VITAMIN B-6) 100 MG tablet Take 100 mg by mouth daily.   Red Yeast Rice Extract (RED YEAST RICE PO) Take 1 capsule by mouth  in the morning and at bedtime.   sacubitril-valsartan (ENTRESTO) 49-51 MG Take 1 tablet by mouth daily.   spironolactone (ALDACTONE) 25 MG tablet Take 1 tablet (25 mg total) by mouth daily.   torsemide (DEMADEX) 10 MG tablet Take 1 tablet (10 mg total) by mouth 3 (three) times a week.   triamcinolone (NASACORT) 55 MCG/ACT AERO nasal inhaler Place into the nose.   vitamin E 400 UNIT capsule Take 400 Units by mouth daily.   [DISCONTINUED] bisoprolol (ZEBETA) 5 MG tablet Take 5 mg  by mouth daily.   No facility-administered encounter medications on file as of 02/14/2021.      [place] on 02/14/21 for general disease state and medication adherence call.   Patient is not > 5 days past due for refill on the following medications per chart history:  [facility] Medications: Medication Name/mg Last Fill Days Supply Entresto 49-51mg   Gets from [facility] (she has supply on hand)   What concerns do you have about your medications? The patient reports she has called [facility] in [place] several times to tell them she is getting Entresto from [facility] and still gets phone calls that her RX is ready for pickup.  The patient denies side effects with her medications.   How often do you forget or accidentally miss a dose? Never  Do you use a pillbox? Yes  Are you having any problems getting your medications from your pharmacy? No  Has the cost of your medications been a concern? No  Since last visit with CPP, the following interventions have been made:  Patiient is receiving Entresto from the manufacturer.  The patient has not had an ED visit since last contact.   The patient denies problems with their health.   she denies  concerns or questions for [name], Pharm. D at this time.   Counseled patient on:  [place] job taking medications, Importance of taking medication daily without missed doses, Benefits of adherence packaging or a pillbox, and Access to CCM team for any cost, medication or pharmacy concerns.  Care Gaps: Annual wellness visit in last year? No   Most Recent BP reading: 108/70  86-P  02/07/21  PCP appointment on 04/08/21, Cardiology appointment on 04/11/21, and Pulmonology appointment with on 03/04/21   [name], CPP notified  Avel Sensor, [place] Assistant [phone]  I have reviewed the care management and care coordination activities outlined in this encounter and I am certifying  that I agree with the content of this note. No further action required.  [name], PharmD Clinical Pharmacist [facility] [phone]

## 2021-02-15 ENCOUNTER — Ambulatory Visit: Payer: Medicare Other | Admitting: Family Medicine

## 2021-02-15 ENCOUNTER — Ambulatory Visit (INDEPENDENT_AMBULATORY_CARE_PROVIDER_SITE_OTHER): Payer: Medicare Other

## 2021-02-15 DIAGNOSIS — I420 Dilated cardiomyopathy: Secondary | ICD-10-CM | POA: Diagnosis not present

## 2021-02-15 LAB — CUP PACEART REMOTE DEVICE CHECK
Battery Remaining Longevity: 60 mo
Battery Voltage: 2.97 V
Brady Statistic AP VP Percent: 0.04 %
Brady Statistic AP VS Percent: 0.01 %
Brady Statistic AS VP Percent: 99.69 %
Brady Statistic AS VS Percent: 0.26 %
Brady Statistic RA Percent Paced: 0.05 %
Brady Statistic RV Percent Paced: 99.54 %
Date Time Interrogation Session: 20221004084301
HighPow Impedance: 80 Ohm
Implantable Lead Implant Date: 20130301
Implantable Lead Implant Date: 20200622
Implantable Lead Implant Date: 20200622
Implantable Lead Location: 753859
Implantable Lead Location: 753860
Implantable Lead Location: 753860
Implantable Lead Model: 3830
Implantable Lead Model: 5076
Implantable Lead Model: 6935
Implantable Pulse Generator Implant Date: 20200622
Lead Channel Impedance Value: 209 Ohm
Lead Channel Impedance Value: 304 Ohm
Lead Channel Impedance Value: 380 Ohm
Lead Channel Impedance Value: 399 Ohm
Lead Channel Impedance Value: 437 Ohm
Lead Channel Impedance Value: 437 Ohm
Lead Channel Pacing Threshold Amplitude: 0.375 V
Lead Channel Pacing Threshold Amplitude: 0.5 V
Lead Channel Pacing Threshold Amplitude: 1 V
Lead Channel Pacing Threshold Pulse Width: 0.4 ms
Lead Channel Pacing Threshold Pulse Width: 0.4 ms
Lead Channel Pacing Threshold Pulse Width: 0.4 ms
Lead Channel Sensing Intrinsic Amplitude: 2.75 mV
Lead Channel Sensing Intrinsic Amplitude: 2.75 mV
Lead Channel Sensing Intrinsic Amplitude: 6.75 mV
Lead Channel Sensing Intrinsic Amplitude: 6.75 mV
Lead Channel Setting Pacing Amplitude: 1.5 V
Lead Channel Setting Pacing Amplitude: 1.5 V
Lead Channel Setting Pacing Amplitude: 2.5 V
Lead Channel Setting Pacing Pulse Width: 0.4 ms
Lead Channel Setting Pacing Pulse Width: 0.4 ms
Lead Channel Setting Sensing Sensitivity: 0.3 mV

## 2021-02-21 ENCOUNTER — Encounter: Payer: Self-pay | Admitting: Ophthalmology

## 2021-02-22 ENCOUNTER — Encounter: Payer: Self-pay | Admitting: Family Medicine

## 2021-02-23 NOTE — Progress Notes (Signed)
Remote ICD transmission.   

## 2021-03-04 ENCOUNTER — Encounter: Payer: Self-pay | Admitting: Adult Health

## 2021-03-04 ENCOUNTER — Other Ambulatory Visit: Payer: Self-pay

## 2021-03-04 ENCOUNTER — Ambulatory Visit (INDEPENDENT_AMBULATORY_CARE_PROVIDER_SITE_OTHER): Payer: Medicare Other | Admitting: Adult Health

## 2021-03-04 DIAGNOSIS — J9611 Chronic respiratory failure with hypoxia: Secondary | ICD-10-CM | POA: Diagnosis not present

## 2021-03-04 DIAGNOSIS — J4530 Mild persistent asthma, uncomplicated: Secondary | ICD-10-CM

## 2021-03-04 DIAGNOSIS — I420 Dilated cardiomyopathy: Secondary | ICD-10-CM

## 2021-03-04 DIAGNOSIS — J45909 Unspecified asthma, uncomplicated: Secondary | ICD-10-CM | POA: Insufficient documentation

## 2021-03-04 MED ORDER — LEVALBUTEROL TARTRATE 45 MCG/ACT IN AERO: 2.0000 | INHALATION_SPRAY | Freq: Four times a day (QID) | RESPIRATORY_TRACT | 3 refills | Status: DC | PRN

## 2021-03-04 MED ORDER — BECLOMETHASONE DIPROP HFA 80 MCG/ACT IN AERB: 2.0000 | INHALATION_SPRAY | Freq: Two times a day (BID) | RESPIRATORY_TRACT | 5 refills | Status: AC

## 2021-03-04 NOTE — Patient Instructions (Addendum)
Continue on QVAR 1 puff Twice daily , rinse after use  Continue on Xopenex 1-2 puffs every 4-6 hr as needed Continue on Oxygen 2l/m with activity and At bedtime  (Goal is to have O2 sats >88-90%) Activity as tolerated.  Follow up with Dr. [name] in 6 months and As needed

## 2021-03-04 NOTE — Assessment & Plan Note (Signed)
Patient is continue on oxygen 2 L with activity and at bedtime to maintain O2 saturations greater than 88 to 90%.

## 2021-03-04 NOTE — Progress Notes (Signed)
@Patient  ID: Ashley [name], female    DOB: 08/08/1945, 75 y.o.   MRN: [id number]  Chief Complaint  Patient presents with   Follow-up    Referring provider: [name], MD  HPI: 75 year old female followed for asthma, allergic rhinitis and chronic cough Medical history significant for nonischemic cardiomyopathy, IgM lambda monoclonal gammopathy   TEST/EVENTS :  11/04/2019 PFTs: FEV1 was 1.97 L or 88% predicted, FVC C was 2.72 L or 91% predicted, FEV1/FVC was 72%.  There was no bronchodilator response.  ERV was 12% consistent with obesity. There was no overt air trapping or hyperinflation (my review).  Flow volume loop was normal.  Diffusion capacity normal.  Normal PFTs for age.  CT chest angio December 02, 2020 negative for PE mild atelectasis and trace pleural effusion on the right.  Bronchomalacia with intermittent airway collapse  03/04/2021 Follow up ; Asthma , Chronic cough  Patient presents for a follow-up visit.  Patient complains that she has had some increased cough and shortness of breath since she had COVID-19 in July 2022.  Cough does seem to be improving and her shortness of breath has decreased as well.  Patient was started on oxygen after having COVID-19 in July.  She says she uses it mainly at nighttime.  However has decreased her frequency of use.  Says that her oxygen levels have been doing better. Patient remains on Qvar she usually uses it about 1 puff daily.  Says occasionally she will use it twice a day.  She is had no increased Xopenex use.  Patient says she is starting to regain some of her energy level and activity tolerance but wears out easily and has ongoing fatigue since having COVID.      Allergies  Allergen Reactions   Atorvastatin Other (See Comments)    Severe muscle cramps to generic atorvastatin.  Able to take brand lipitor.   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    Influenza Vac Split [Influenza Virus Vaccine] Other (See Comments)    "allergic to concentrated eggs that are in vaccine"   Latex Nausea Only, Rash and Other (See Comments)    Pain and infection   Morphine And Related Other (See Comments)    Internally feels like she is on fire    Pravastatin Other (See Comments)    myalgias   Prevacid [Lansoprazole] Other (See Comments)    Worsened GI side effects   Prevnar [Pneumococcal 13-Val Conj Vacc] Other (See Comments)    Egg allergy Bad reaction after shot   Rosuvastatin Other (See Comments)    Was not effective controlling lipids   Simvastatin Other (See Comments)    Leg cramps   Tape Other (See Comments) and Rash    All adhesives  - Blisters, itching and burning  blistering   Farxiga [Dapagliflozin]    Metaxalone    Tramadol  Leg cramps   Antihistamines, Chlorpheniramine-Type Other (See Comments)    Alters vision   Betadine [Povidone Iodine] Rash    Per pt.    Onion Diarrhea and Other (See Comments)    GI distress, flared IBS   Other Hives and Rash    NOTE: pt is able to take cephalosporins without reaction   Penicillins Other (See Comments)    CHILDHOOD ALLEGY/REACTION: "told 50 years ago I couldn't take it; might be immune to it", able to take amoxicillin Has patient had a PCN reaction causing immediate rash, facial/tongue/throat swelling, SOB or lightheadedness with hypotension: Unknown Has patient had a PCN reaction causing severe rash involving mucus membranes  or skin necrosis: UnknowN Has patient had a PCN reaction that required hospitalization: /Unknown Has patient had a PCN reaction occurring within the last 10 years: Unknown If a   Red Dye Nausea Only and Swelling   Sertraline Other (See Comments)    Overly sedating   Sulfasalazine Other (See Comments)    "makes my whole body smell like sulfa & makes me sick just smelling it"    Immunization History  Administered Date(s) Administered   Pneumococcal Conjugate-13 06/08/2015   Pneumococcal Polysaccharide-23 02/16/2012   Td 11/12/2005    Past Medical History:  Diagnosis Date   Abdominal aortic atherosclerosis (HCC) 11/2015   by CT   Allergy    Anemia    Anxiety    Automatic implantable cardioverter-defibrillator in situ    a. 2012 s/p MDT single lead ICD; b. 10/2018 BiV upgrade-->MDT ser # [id number] H.   Bronchiectasis    a. possibly mild, treat URIs aggressively   Cancer (HCC)    basal cell, arms & neck   Chronic sinusitis    COPD (chronic obstructive pulmonary disease) (HCC)    COVID-19 12/06/2020   Depression    Dyspnea    Essential hypertension    Pt denies   Family history of adverse reaction to anesthesia    Mother - PONV   Fibrocystic breast disease    Fibromyalgia    GERD (gastroesophageal reflux disease)    H/O multiple allergies 10/10/2013   Hemorrhoids    HFrEF (heart failure with reduced ejection fraction) (HCC)    a. 06/2011 cMRI: EF 34%, no LGE; b. 01/2016 Echo: EF 35-40%; c. 07/2016 Echo: EF 40-45%; d. 02/2020 Echo: EF 20-25%, glob HK. GrI DD. Mildly red RV fxn. Mildly dil LA. Triv MR.   History of diverticulitis of colon    History of pneumonia 2012   Insomnia    Irritable bowel syndrome    Laryngeal nodule    Dr. Thelma Comp   Migraine without aura, without mention of intractable migraine without mention of status migrainosus    Mitral regurgitation    Neuropathy    feet and hands   Nonischemic cardiomyopathy (HCC)    a. 2012 s/p single lead ICD; b. 06/2011  cMRI: EF 34%, no LGE; b. 01/2016 Echo: EF 35-40%; c. 07/2016 Echo: EF 40-45%; d. 10/2018 Cath: nl cors. EF 20-25%; e. 10/2018 s/p BiV MDT ICD upgrade; f. 02/2020 Echo: EF 20-25%, glob HK. GrI DD.   Osteoarthritis    knees, back, hands, low back,    Osteoporosis 01/2013   T-2.7 spine (01/2016)   PONV (postoperative nausea and vomiting)    sigmoid diverticulitis with perforation, abscess and fistula 09/30/2013   Sleep apnea 2010   borderline, inconclusive- /w [place]     Tobacco History: Social History   Tobacco Use  Smoking Status  Never  Smokeless Tobacco Never  Tobacco Comments   Lived with smokers x 23 yrs, smoked herself "for a week"   Counseling given: Not Answered Tobacco comments: Lived with smokers x 23 yrs, smoked herself "for a week"   Outpatient Medications Prior to Visit  Medication Sig Dispense Refill   acetaminophen (TYLENOL) 500 MG tablet Take 500 mg by mouth daily as needed for moderate pain or headache (PAIN).      AMBULATORY NON FORMULARY MEDICATION Medication Name:  1 squatty potty to use as needed 1 each 0   Ascorbic Acid (VITAMIN C) 1000 MG tablet Take 1,000 mg by mouth See admin instructions. Take 1000 mg daily, may take an additional 1000 mg as needed for cold symptoms     aspirin EC 81 MG tablet Take 81 mg by mouth at bedtime.      benzonatate (TESSALON) 200 MG capsule Take 1 capsule (200 mg total) by mouth 3 (three) times daily as needed for cough. 30 capsule 6   carvedilol (COREG) 6.25 MG tablet Take 0.5 tablets (3.125 mg total) by mouth 2 (two) times daily with a meal. 90 tablet 1   cholecalciferol (VITAMIN D) 1000 units tablet Take 1,000 Units by mouth daily.      Coenzyme Q10 (COQ-10) 100 MG CAPS Take 100 mg by mouth daily.     dextromethorphan (DELSYM) 30 MG/5ML liquid Take 15 mg by mouth daily as needed for cough.      ezetimibe (ZETIA) 10 MG tablet Take 1 tablet (10 mg total) by mouth daily. 90 tablet 3   GARLIC PO Take 1 capsule by mouth daily.      guaiFENesin (MUCINEX) 600 MG 12 hr tablet Take 600 mg by mouth 2 (two) times daily.     magnesium 30 MG tablet Take 30 mg by mouth daily.     Menthol, Topical Analgesic, (BIOFREEZE ROLL-ON EX) Apply topically.     miconazole (MICOTIN) 2 % powder Apply 1 application topically as needed (yeast).     nystatin cream (MYCOSTATIN) Apply 1 application topically as needed for dry skin (yeast infection). 30 g 3   Omega-3 Fatty Acids (FISH OIL) 1000 MG CAPS Take 1 capsule (1,000 mg total) by mouth daily.  0   OXYGEN Inhale into the lungs as needed.     pantoprazole (PROTONIX) 40 MG tablet Take 1 tablet (40 mg total) by mouth every Monday, Wednesday, and Friday.     potassium chloride (KLOR-CON) 10 MEQ tablet Take 1 tablet (10 mEq total) by mouth 3 (three) times a week. 40 tablet 1   pyridOXINE (VITAMIN B-6) 100 MG tablet Take 100 mg by mouth daily.     Red Yeast Rice Extract (RED YEAST RICE PO) Take 1 capsule by mouth in the morning and at bedtime.     sacubitril-valsartan (ENTRESTO) 49-51 MG Take 1 tablet by mouth daily.     spironolactone (ALDACTONE) 25 MG tablet Take 1 tablet (25 mg total) by mouth daily. 90 tablet 1   torsemide (DEMADEX) 10 MG tablet Take 1 tablet (10 mg total) by mouth 3 (three) times a week. 40 tablet 1   triamcinolone (NASACORT) 55 MCG/ACT AERO nasal inhaler Place into the nose.     vitamin E 400 UNIT capsule Take 400 Units by mouth daily.     beclomethasone (QVAR) 80 MCG/ACT inhaler Inhale 1 puff into the lungs daily.     levalbuterol (XOPENEX HFA) 45 MCG/ACT inhaler Inhale 2 puffs into the lungs every 6 (six) hours  as needed for wheezing or shortness of breath. 15 g 3   Biotin 10 MG CAPS Take 1 tablet by mouth daily. (Patient not taking: No sig reported)     fluorouracil (EFUDEX) 5 % cream Apply topically. (Patient not taking: No sig reported)     LORazepam (ATIVAN) 0.5 MG tablet Take 0.5-1 tablets (0.25-0.5 mg total) by mouth at bedtime as needed for anxiety or sleep. (Patient  not taking: No sig reported) 30 tablet 1   No facility-administered medications prior to visit.     Review of Systems:   Constitutional:   No  weight loss, night sweats,  Fevers, chills,  +fatigue, or  lassitude.  HEENT:   No headaches,  Difficulty swallowing,  Tooth/dental problems, or  Sore throat,                No sneezing, itching, ear ache, nasal congestion, post nasal drip,   CV:  No chest pain,  Orthopnea, PND, swelling in lower extremities, anasarca, dizziness, palpitations, syncope.   GI  No heartburn, indigestion, abdominal pain, nausea, vomiting, diarrhea, change in bowel habits, loss of appetite, bloody stools.   Resp: ,  No non-productive cough,  No coughing up of blood.  No change in color of mucus.  No wheezing.  No chest wall deformity  Skin: no rash or lesions.  GU: no dysuria, change in color of urine, no urgency or frequency.  No flank pain, no hematuria   MS:  No joint pain or swelling.  No decreased range of motion.  No back pain.    Physical Exam  BP 106/60 (BP Location: Right Arm, Patient Position: Sitting, Cuff Size: Normal)   Pulse 93   Temp 98.2 F (36.8 C) (Oral)   Ht 5\' 5"  (1.651 m)   Wt 188 lb (85.3 kg)   SpO2 96%   BMI 31.28 kg/m   GEN: A/Ox3; pleasant , NAD, well nourished    HEENT:  [place]/AT, , NOSE-clear, THROAT-clear, no lesions, no postnasal drip or exudate noted.   NECK:  Supple w/ fair ROM; no JVD; normal carotid impulses w/o bruits; no thyromegaly or nodules palpated; no lymphadenopathy.    RESP  Clear  P & A; w/o, wheezes/ rales/ or rhonchi. no accessory muscle use, no dullness to percussion  CARD:  RRR, no m/r/g, tr  peripheral edema, pulses intact, no cyanosis or clubbing.  GI:   Soft & nt; nml bowel sounds; no organomegaly or masses detected.   Musco: Warm bil, no deformities or joint swelling noted.   Neuro: alert, no focal deficits noted.    Skin: Warm, no lesions or rashes    Lab  Results:  CBC   BMET     Imaging: CUP PACEART REMOTE DEVICE CHECK  Result Date: 02/15/2021 Scheduled remote reviewed. Normal device function.  Next remote 91 days- JBox, RN/CVRS   perflutren lipid microspheres (DEFINITY) IV suspension     Date Action Dose Route User   01/07/2021 1030 Given 2 mL Intravenous Quentin Ore T       No flowsheet data found.  No results found for: NITRICOXIDE      Assessment & Plan:   Asthma Mild persistent asthma.  Recent flare after COVID-19 in July.  Seems to be returning back to baseline.  Patient is continue on Qvar along with trigger prevention.  Plan  Patient Instructions  Continue on QVAR 1 puff Twice daily , rinse after use  Continue on Xopenex 1-2 puffs every 4-6 hr as needed  Continue on Oxygen 2l/m with activity and At bedtime  (Goal is to have O2 sats >88-90%) Activity as tolerated.  Follow up with Dr. Jayme Cloud in 6 months and As needed         Chronic respiratory failure with hypoxia [facility]) Patient is continue on oxygen 2 L with activity and at bedtime to maintain O2 saturations greater than 88 to 90%.  Cardiomyopathy, dilated, nonischemic (HCC) Continue follow-up with cardiology and current maintenance regimen     Shauntee Karp, NP 03/04/2021

## 2021-03-04 NOTE — Assessment & Plan Note (Signed)
Mild persistent asthma.  Recent flare after COVID-19 in July.  Seems to be returning back to baseline.  Patient is continue on Qvar along with trigger prevention.  Plan  Patient Instructions  Continue on QVAR 1 puff Twice daily , rinse after use  Continue on Xopenex 1-2 puffs every 4-6 hr as needed Continue on Oxygen 2l/m with activity and At bedtime  (Goal is to have O2 sats >88-90%) Activity as tolerated.  Follow up with Dr. [name] in 6 months and As needed

## 2021-03-04 NOTE — Progress Notes (Signed)
Agree with the details of the visit as noted by Tammy Parrett, NP.  C. Laura Mahalia Dykes, MD [facility] 

## 2021-03-04 NOTE — Assessment & Plan Note (Signed)
Continue follow-up with cardiology and current maintenance regimen 

## 2021-03-07 NOTE — Discharge Instructions (Signed)

## 2021-03-08 ENCOUNTER — Other Ambulatory Visit: Payer: Self-pay

## 2021-03-08 ENCOUNTER — Encounter: Payer: Self-pay | Admitting: Ophthalmology

## 2021-03-08 ENCOUNTER — Ambulatory Visit: Payer: Medicare Other | Admitting: Anesthesiology

## 2021-03-08 ENCOUNTER — Encounter
Admission: RE | Disposition: A | Discharge status: Home / Self Care | Payer: Self-pay | Source: Home / Self Care | Attending: Ophthalmology

## 2021-03-08 ENCOUNTER — Ambulatory Visit
Admission: RE | Admit: 2021-03-08 | Discharge: 2021-03-08 | Disposition: A | Discharge status: Home / Self Care | Payer: Medicare Other | Attending: Ophthalmology | Admitting: Ophthalmology

## 2021-03-08 DIAGNOSIS — Z7951 Long term (current) use of inhaled steroids: Secondary | ICD-10-CM | POA: Insufficient documentation

## 2021-03-08 DIAGNOSIS — Z885 Allergy status to narcotic agent status: Secondary | ICD-10-CM | POA: Insufficient documentation

## 2021-03-08 DIAGNOSIS — Z8616 Personal history of COVID-19: Secondary | ICD-10-CM | POA: Insufficient documentation

## 2021-03-08 DIAGNOSIS — Z880 Allergy status to penicillin: Secondary | ICD-10-CM | POA: Diagnosis not present

## 2021-03-08 DIAGNOSIS — Z79899 Other long term (current) drug therapy: Secondary | ICD-10-CM | POA: Insufficient documentation

## 2021-03-08 DIAGNOSIS — Z7982 Long term (current) use of aspirin: Secondary | ICD-10-CM | POA: Insufficient documentation

## 2021-03-08 DIAGNOSIS — H25811 Combined forms of age-related cataract, right eye: Secondary | ICD-10-CM | POA: Diagnosis not present

## 2021-03-08 DIAGNOSIS — Z888 Allergy status to other drugs, medicaments and biological substances status: Secondary | ICD-10-CM | POA: Insufficient documentation

## 2021-03-08 DIAGNOSIS — F17200 Nicotine dependence, unspecified, uncomplicated: Secondary | ICD-10-CM | POA: Diagnosis not present

## 2021-03-08 DIAGNOSIS — Z882 Allergy status to sulfonamides status: Secondary | ICD-10-CM | POA: Insufficient documentation

## 2021-03-08 DIAGNOSIS — H2511 Age-related nuclear cataract, right eye: Secondary | ICD-10-CM | POA: Diagnosis not present

## 2021-03-08 DIAGNOSIS — H25041 Posterior subcapsular polar age-related cataract, right eye: Secondary | ICD-10-CM | POA: Diagnosis not present

## 2021-03-08 HISTORY — DX: Polyneuropathy, unspecified: G62.9

## 2021-03-08 HISTORY — PX: CATARACT EXTRACTION W/PHACO: SHX586

## 2021-03-08 SURGERY — PHACOEMULSIFICATION, CATARACT, WITH IOL INSERTION
Anesthesia: Topical | Site: Eye | Laterality: Right

## 2021-03-08 MED ORDER — LACTATED RINGERS IV SOLN: INTRAVENOUS | Status: DC

## 2021-03-08 MED ORDER — ARMC OPHTHALMIC DILATING DROPS
1.0000 "application " | OPHTHALMIC | Status: DC | PRN
  Administered 2021-03-08 (×3): 1 via OPHTHALMIC

## 2021-03-08 MED ORDER — TETRACAINE HCL 0.5 % OP SOLN
1.0000 [drp] | OPHTHALMIC | Status: DC | PRN
  Administered 2021-03-08 (×3): 1 [drp] via OPHTHALMIC

## 2021-03-08 MED ORDER — SIGHTPATH DOSE#1 NA CHONDROIT SULF-NA HYALURON 40-17 MG/ML IO SOLN
INTRAOCULAR | Status: DC | PRN
  Administered 2021-03-08: 1 mL via INTRAOCULAR

## 2021-03-08 MED ORDER — SIGHTPATH DOSE#1 BSS IO SOLN
INTRAOCULAR | Status: DC | PRN
  Administered 2021-03-08: 15 mL

## 2021-03-08 MED ORDER — MIDAZOLAM HCL 2 MG/2ML IJ SOLN
INTRAMUSCULAR | Status: DC | PRN
  Administered 2021-03-08: 2 mg via INTRAVENOUS

## 2021-03-08 MED ORDER — BRIMONIDINE TARTRATE-TIMOLOL 0.2-0.5 % OP SOLN
OPHTHALMIC | Status: DC | PRN
  Administered 2021-03-08: 1 [drp] via OPHTHALMIC

## 2021-03-08 MED ORDER — MOXIFLOXACIN HCL 0.5 % OP SOLN
OPHTHALMIC | Status: DC | PRN
  Administered 2021-03-08: 0.2 mL via OPHTHALMIC

## 2021-03-08 MED ORDER — SIGHTPATH DOSE#1 BSS IO SOLN
INTRAOCULAR | Status: DC | PRN
  Administered 2021-03-08: 82 mL via OPHTHALMIC

## 2021-03-08 MED ORDER — FENTANYL CITRATE (PF) 100 MCG/2ML IJ SOLN
INTRAMUSCULAR | Status: DC | PRN
  Administered 2021-03-08: 50 ug via INTRAVENOUS

## 2021-03-08 MED ORDER — SIGHTPATH DOSE#1 BSS IO SOLN: INTRAOCULAR | Status: DC | PRN

## 2021-03-08 SURGICAL SUPPLY — 17 items
CANNULA ANT/CHMB 27GA (MISCELLANEOUS) IMPLANT
GLOVE SURG ENC TEXT LTX SZ8 (GLOVE) ×2 IMPLANT
GLOVE SURG TRIUMPH 8.0 PF LTX (GLOVE) ×2 IMPLANT
GOWN STRL REUS W/ TWL LRG LVL3 (GOWN DISPOSABLE) ×2 IMPLANT
GOWN STRL REUS W/TWL LRG LVL3 (GOWN DISPOSABLE) ×4
LENS IOL TECNIS EYHANCE 18.5 (Intraocular Lens) ×2 IMPLANT
MARKER SKIN DUAL TIP RULER LAB (MISCELLANEOUS) IMPLANT
NEEDLE FILTER BLUNT 18X 1/2SAF (NEEDLE) ×1
NEEDLE FILTER BLUNT 18X1 1/2 (NEEDLE) ×1 IMPLANT
PACK EYE AFTER SURG (MISCELLANEOUS) IMPLANT
RING MALYGIN (MISCELLANEOUS) IMPLANT
SUT ETHILON 10-0 CS-B-6CS-B-6 (SUTURE)
SUTURE EHLN 10-0 CS-B-6CS-B-6 (SUTURE) IMPLANT
SYR 3ML LL SCALE MARK (SYRINGE) ×2 IMPLANT
SYR TB 1ML LUER SLIP (SYRINGE) ×2 IMPLANT
WATER STERILE IRR 250ML POUR (IV SOLUTION) ×2 IMPLANT
WIPE NON LINTING 3.25X3.25 (MISCELLANEOUS) IMPLANT

## 2021-03-08 NOTE — Anesthesia Preprocedure Evaluation (Signed)
Anesthesia Evaluation  Patient identified by MRN, date of birth, ID band Patient awake    Reviewed: NPO status   History of Anesthesia Complications (+) PROLONGED EMERGENCE and history of anesthetic complications  Airway Mallampati: II  TM Distance: >3 FB Neck ROM: full   Comment: Torus palatinus Dental no notable dental hx.    Pulmonary shortness of breath, asthma (mild) ,  pulm: 02/2021:Parrett: "Patient complains that she has had some increased cough and shortness of breath since she had COVID-19 in July 2022.  Cough does seem to be improving and her shortness of breath has decreased as well.  Patient was started on oxygen after having COVID-19 in July.  She says she uses it mainly at nighttime.  However has decreased her frequency of use.  Says that her oxygen levels have been doing better. Patient remains on Qvar she usually uses it about 1 puff daily.  Says occasionally she will use it twice a day.  She is had no increased Xopenex use.  Patient says she is starting to regain some of her energy level and activity tolerance but wears out easily and has ongoing fatigue since having COVID." Chronic respiratory failure with hypoxia;  Patient is continue on oxygen 2 L with activity and at bedtime to maintain O2 saturations greater than 88 to 90%.   Laryngeal nodule: resolved.   Pulmonary exam normal        Cardiovascular Exercise Tolerance: Good hypertension, +CHF (Cardiomyopathy, dilated, nonischemic ; ef=30%;)  Normal cardiovascular exam+ dysrhythmias (LBBB) + Cardiac Defibrillator   ekg: 01/2021: Atrial-sensed ventricular-paced rhythm;  echo: 12/2020:  1. Left ventricular ejection fraction, by estimation, is 30 %. The left  ventricle has moderately decreased function. The left ventricle  demonstrates regional wall motion abnormalities.Left ventricular  diastolic parameters are consistent with Grade I diastolic dysfunction   (impaired relaxation).  2. Right ventricular systolic function is normal. The right ventricular  size is normal. There is normal pulmonary artery systolic pressure.   cath: 2020: 1.  Normal coronary arteries.  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 2.  Severely reduced LV systolic function with an EF of 20 to 25%. 3.  Right heart catheterization showed moderately elevated filling pressures, mild pulmonary hypertension and mildly reduced cardiac output.    Neuro/Psych  Headaches, PSYCHIATRIC DISORDERS Anxiety Depression Neuropathy: Legs    GI/Hepatic Neg liver ROS, GERD  Controlled,ibs   Endo/Other  Morbid obesity (bmi 31)  Renal/GU negative Renal ROS  negative genitourinary   Musculoskeletal  (+) Arthritis , Fibromyalgia -  Abdominal   Peds  Hematology IgM monoclonal gammopathy   Anesthesia Other Findings 25+ drug allergies;  cards:01/2021: [place];  Reproductive/Obstetrics                             Anesthesia Physical Anesthesia Plan  ASA: 3  Anesthesia Plan:    Post-op Pain Management:    Induction:   PONV Risk Score and Plan: Midazolam and TIVA  Airway Management Planned:   Additional Equipment:   Intra-op Plan:   Post-operative Plan:   Informed Consent: I have reviewed the patients History and Physical, chart, labs and discussed the procedure including the risks, benefits and alternatives for the proposed anesthesia with the patient or authorized representative who has indicated his/her understanding and acceptance.       Plan Discussed with: CRNA  Anesthesia Plan Comments:         Anesthesia Quick Evaluation

## 2021-03-08 NOTE — Anesthesia Postprocedure Evaluation (Signed)
Anesthesia Post Note  Patient: Ashley [name]  Procedure(s) Performed: CATARACT EXTRACTION PHACO AND INTRAOCULAR LENS PLACEMENT (IOC) RIGHT (Right: Eye)     Patient location during evaluation: PACU Anesthesia Type: MAC Level of consciousness: awake and alert Pain management: pain level controlled Vital Signs Assessment: post-procedure vital signs reviewed and stable Respiratory status: spontaneous breathing, nonlabored ventilation, respiratory function stable and patient connected to nasal cannula oxygen Cardiovascular status: stable and blood pressure returned to baseline Postop Assessment: no apparent nausea or vomiting Anesthetic complications: no   No notable events documented.  Fidel Levy

## 2021-03-08 NOTE — Anesthesia Procedure Notes (Signed)
Date/Time: 03/08/2021 12:48 PM Performed by: Mayme Genta, CRNA Pre-anesthesia Checklist: Patient identified, Emergency Drugs available, Suction available, Timeout performed and Patient being monitored Patient Re-evaluated:Patient Re-evaluated prior to induction Oxygen Delivery Method: Nasal cannula Placement Confirmation: positive ETCO2

## 2021-03-08 NOTE — H&P (Signed)
[facility]   Primary Care Physician:  [name], MD Ophthalmologist: Dr. George Ina  Pre-Procedure History & Physical: HPI:  [facility] [name] is a 75 y.o. female here for cataract surgery.   Past Medical History:  Diagnosis Date   Abdominal aortic atherosclerosis ([place]) 11/2015   by CT   Allergy    Anemia    Anxiety    Automatic implantable cardioverter-defibrillator in situ    a. 2012 s/p MDT single lead ICD; b. 10/2018 BiV upgrade-->MDT ser # [id number] H.   Bronchiectasis    a. possibly mild, treat URIs aggressively   Cancer ([place])    basal cell, arms & neck   Chronic sinusitis    COPD (chronic obstructive pulmonary disease) ([place])    COVID-19 12/06/2020   Depression    Dyspnea    Essential hypertension    Pt denies   Family history of adverse reaction to anesthesia    Mother - PONV   Fibrocystic breast disease    Fibromyalgia    GERD (gastroesophageal reflux disease)    H/O multiple allergies 10/10/2013   Hemorrhoids    HFrEF (heart failure with reduced ejection fraction) ([place])    a. 06/2011 cMRI: EF 34%, no LGE; b. 01/2016 Echo: EF 35-40%; c. 07/2016 Echo: EF 40-45%; d. 02/2020 Echo: EF 20-25%, glob HK. GrI DD. Mildly red RV fxn. Mildly dil LA. Triv MR.   History of diverticulitis of colon    History of pneumonia 2012   Insomnia    Irritable bowel syndrome    Laryngeal nodule    Dr. Thomasena Edis   Migraine without aura, without mention of intractable migraine without mention of status migrainosus    Mitral regurgitation    Neuropathy    feet and hands   Nonischemic cardiomyopathy ([place])    a. 2012 s/p single lead ICD; b. 06/2011 cMRI: EF 34%, no LGE; b. 01/2016 Echo: EF 35-40%; c. 07/2016 Echo: EF 40-45%; d. 10/2018 Cath: nl cors. EF 20-25%; e. 10/2018 s/p BiV MDT ICD upgrade; f. 02/2020 Echo: EF 20-25%, glob HK. GrI DD.   Osteoarthritis    knees, back, hands, low back,    Osteoporosis 01/2013   T-2.7 spine (01/2016)   PONV (postoperative nausea and vomiting)     sigmoid diverticulitis with perforation, abscess and fistula 09/30/2013   Sleep apnea 2010   borderline, inconclusive- /w [place]     Past Surgical History:  Procedure Laterality Date   ABDOMINAL HYSTERECTOMY  1987   w/BSO   ANAL RECTAL MANOMETRY N/A 02/02/2016   Procedure: ANO RECTAL MANOMETRY;  Surgeon: Mauri Pole, MD;  Location: [facility];  Service: Endoscopy;  Laterality: N/A;   APPENDECTOMY     AUGMENTATION MAMMAPLASTY     BIV UPGRADE N/A 11/04/2018   Procedure: BIV ICD  UPGRADE;  Surgeon: Evans Lance, MD;  Location: [place] [facility];  Service: Cardiovascular;  Laterality: N/A;   breast cystectomies     due to [place]  01/2010   Left-fibrocystic change w/intraductal papilloma, no malignancy   carotid US  74/0814   4-81% LICA, normal RICA   [place] Bunionectomy  11/04/08   Right Great Toe (Dr. Beola Cord)   COLONOSCOPY  01/2018   1 small benign polyp, diverticulosis, healthy anastomosis [name] @ [facility])   COLOSTOMY N/A 10/02/2013   DESCENDING COLOSTOMY;  Surgeon: Imogene Burn. Georgette Dover, MD   COLOSTOMY REVERSAL N/A 02/10/2014   [name], MD   EMG/MCV  10/16/01   +  mild carpal tunnel   ESOPHAGOGASTRODUODENOSCOPY  09/2014   small HH, irregular Z line, fundic gland polyps with mild gastritis Olevia Perches)   Flex laryngoscopy  06/11/06   ([place]) nml   IMPLANTABLE CARDIOVERTER DEFIBRILLATOR IMPLANT N/A 07/14/2011   MDT single chamber ICD   KNEE ARTHROSCOPY W/ ORIF     Left; "meniscus tear"   LASIK     left eye   MANDIBLE SURGERY     "got about 6 pins in the bottom of my jaw"   NCS  11/2014   L ulnar neuropathy, mild B median nerve entrapment ([place])   OSTEOTOMY     Left foot   PARTIAL COLECTOMY N/A 10/02/2013   PARTIAL COLECTOMY;  Surgeon: Imogene Burn. Tsuei, MD   RIGHT/LEFT HEART CATH AND CORONARY ANGIOGRAPHY N/A 10/16/2018   Procedure: RIGHT/LEFT HEART CATH AND CORONARY ANGIOGRAPHY;  Surgeon: Wellington Hampshire, MD;  Location: [facility];   Service: Cardiovascular;  Laterality: N/A;   sinus surgery     x3 with balloon    TOE AMPUTATION     "toe beside baby toe on left foot; got gangrene from corn"   [place]      Prior to Admission medications   Medication Sig Start Date End Date Taking? Authorizing Provider  acetaminophen (TYLENOL) 500 MG tablet Take 500 mg by mouth daily as needed for moderate pain or headache (PAIN).    Yes [provider]  Ascorbic Acid (VITAMIN C) 1000 MG tablet Take 1,000 mg by mouth See admin instructions. Take 1000 mg daily, may take an additional 1000 mg as needed for cold symptoms   Yes [provider]  aspirin EC 81 MG tablet Take 81 mg by mouth at bedtime.    Yes [provider]  beclomethasone (QVAR) 80 MCG/ACT inhaler Inhale 2 puffs into the lungs 2 (two) times daily. 03/04/21  Yes Parrett, Tammy S, NP  benzonatate (TESSALON) 200 MG capsule Take 1 capsule (200 mg total) by mouth 3 (three) times daily as needed for cough. 12/31/20  Yes [name], MD  Biotin 10 MG CAPS Take 1 tablet by mouth daily.   Yes [provider]  carvedilol (COREG) 6.25 MG tablet Take 0.5 tablets (3.125 mg total) by mouth 2 (two) times daily with a meal. 01/05/21  Yes Theora Gianotti, NP  cholecalciferol (VITAMIN D) 1000 units tablet Take 1,000 Units by mouth daily.    Yes [provider]  Coenzyme Q10 (COQ-10) 100 MG CAPS Take 100 mg by mouth daily.   Yes [provider]  dextromethorphan (DELSYM) 30 MG/5ML liquid Take 15 mg by mouth daily as needed for cough.    Yes [provider]  ezetimibe (ZETIA) 10 MG tablet Take 1 tablet (10 mg total) by mouth daily. 01/06/20  Yes Gollan, Kathlene November, MD  GARLIC PO Take 1 capsule by mouth daily.   Yes [provider]  guaiFENesin (MUCINEX) 600 MG 12 hr tablet Take 600 mg by mouth 2 (two) times daily.   Yes [provider]  levalbuterol (XOPENEX HFA) 45 MCG/ACT inhaler  Inhale 2 puffs into the lungs every 6 (six) hours as needed for wheezing or shortness of breath. 03/04/21  Yes Parrett, Tammy S, NP  magnesium 30 MG tablet Take 30 mg by mouth daily.   Yes [provider]  Menthol, Topical Analgesic, (BIOFREEZE ROLL-ON EX) Apply topically.   Yes [provider]  miconazole (MICOTIN) 2 % powder Apply 1 application topically as needed (  yeast).   Yes [provider]  nystatin cream (MYCOSTATIN) Apply 1 application topically as needed for dry skin (yeast infection). 09/11/19  Yes [name], MD  Omega-3 Fatty Acids (FISH OIL) 1000 MG CAPS Take 1 capsule (1,000 mg total) by mouth daily. 12/04/20  Yes Patrecia Pour, MD  OXYGEN Inhale into the lungs as needed.   Yes [provider]  pantoprazole (PROTONIX) 40 MG tablet Take 1 tablet (40 mg total) by mouth every Monday, Wednesday, and Friday. 08/11/20  Yes [name], MD  potassium chloride (KLOR-CON) 10 MEQ tablet Take 1 tablet (10 mEq total) by mouth 3 (three) times a week. 01/05/21  Yes Theora Gianotti, NP  pyridOXINE (VITAMIN B-6) 100 MG tablet Take 100 mg by mouth daily.   Yes [provider]  Red Yeast Rice Extract (RED YEAST RICE PO) Take 1 capsule by mouth in the morning and at bedtime.   Yes [provider]  sacubitril-valsartan (ENTRESTO) 49-51 MG Take 1 tablet by mouth daily.   Yes [provider]  spironolactone (ALDACTONE) 25 MG tablet Take 1 tablet (25 mg total) by mouth daily. 01/05/21  Yes Theora Gianotti, NP  torsemide (DEMADEX) 10 MG tablet Take 1 tablet (10 mg total) by mouth 3 (three) times a week. 01/05/21  Yes Theora Gianotti, NP  triamcinolone (NASACORT) 55 MCG/ACT AERO nasal inhaler Place into the nose.   Yes [provider]  vitamin E 400 UNIT capsule Take 400 Units by mouth daily.   Yes [provider]  AMBULATORY NON FORMULARY MEDICATION Medication Name:  1 squatty potty to use as  needed 01/20/16   Nandigam, Venia Minks, MD  fluorouracil (EFUDEX) 5 % cream Apply topically. Patient not taking: No sig reported 06/05/20   [provider]  LORazepam (ATIVAN) 0.5 MG tablet Take 0.5-1 tablets (0.25-0.5 mg total) by mouth at bedtime as needed for anxiety or sleep. Patient not taking: No sig reported 03/22/20   [name], MD  bisoprolol (ZEBETA) 5 MG tablet Take 5 mg by mouth daily. 06/06/11 07/02/18  Minna Merritts, MD    Allergies as of 02/01/2021 - Review Complete 01/05/2021  Allergen Reaction Noted   Atorvastatin Other (See Comments) 06/06/2010   Chlorhexidine Other (See Comments) 09/30/2013   Codeine Other (See Comments) 10/23/2013   Cyclobenzaprine Other (See Comments) 02/12/2013   Cymbalta [duloxetine hcl] Other (See Comments) 10/22/2012   Diflucan [fluconazole] Nausea And Vomiting 03/27/2017   Dilaudid [hydromorphone hcl] Other (See Comments) 02/05/2014   Furosemide Other (See Comments) 10/31/2018   Influenza vac split [influenza virus vaccine] Other (See Comments) 08/18/2010   Latex Nausea Only, Rash, and Other (See Comments) 11/22/2006   Morphine and related Other (See Comments) 02/05/2014   Pravastatin Other (See Comments) 04/15/2014   Prevacid [lansoprazole] Other (See Comments) 08/20/2013   Prevnar [pneumococcal 13-val conj vacc] Other (See Comments) 08/30/2015   Rosuvastatin Other (See Comments) 06/06/2010   Simvastatin Other (See Comments) 04/14/2014   Tape Other (See Comments) and Rash 09/14/2013   Farxiga [dapagliflozin]  01/05/2021   Metaxalone  01/31/2013   Tramadol  11/03/2020   Antihistamines, chlorpheniramine-type Other (See Comments) 08/18/2010   Betadine [povidone iodine] Rash 06/04/2019   Onion Diarrhea and Other (See Comments) 11/19/2017   Other Hives and Rash 09/14/2013   Penicillins Other (See Comments) 01/06/2010   Red dye Nausea Only and Swelling 04/01/2012   Sertraline Other (See Comments) 11/06/2016   Sulfasalazine Other  (See Comments)     Family  History  Problem Relation Age of Onset   Lung cancer Father        + smoker   Colon polyps Father    Dementia Mother    Osteoporosis Mother        Lumbar spine   Stroke Mother        x 5 @ 2 YOA   Arthritis Mother        hands   Emphysema Mother    Alcohol abuse Paternal Grandfather    Stroke Paternal Grandfather        ?   Heart disease Paternal Grandfather    Hypertension Maternal Grandfather        ?   Heart disease Paternal Grandmother    Colon cancer Neg Hx    Esophageal cancer Neg Hx    Gallbladder disease Neg Hx     Social History   Socioeconomic History   Marital status: Married    Spouse name: Not on file   Number of children: 1   Years of education: Not on file   Highest education level: Not on file  Occupational History   Occupation: Oncologist   Occupation: Aeronautical engineer  Tobacco Use   Smoking status: Never   Smokeless tobacco: Never   Tobacco comments:    Lived with smokers x 23 yrs, smoked herself "for a week"  Vaping Use   Vaping Use: Never used  Substance and Sexual Activity   Alcohol use: No    Alcohol/week: 0.0 standard drinks   Drug use: No   Sexual activity: Not Currently  Other Topics Concern   Not on file  Social History Narrative   Lives with husband Herbie [place]   1 adopted child   Social Determinants of Radio broadcast assistant Strain: Not on file  Food Insecurity: Not on file  Transportation Needs: Not on file  Physical Activity: Not on file  Stress: Not on file  Social Connections: Not on file  Intimate Partner Violence: Not on file    Review of Systems: See HPI, otherwise negative ROS  Physical Exam: BP (!) 116/55   Pulse 80   Temp (!) 97.2 F (36.2 C) (Temporal)   Resp 20   Ht 5\' 5"  (1.651 m)   Wt 85 kg   SpO2 96%   BMI 31.18 kg/m  General:   Alert, cooperative in NAD Head:  Normocephalic and atraumatic. Respiratory:  Normal work of  breathing. Cardiovascular:  RRR  Impression/Plan: [facility] [name] is here for cataract surgery.  Risks, benefits, limitations, and alternatives regarding cataract surgery have been reviewed with the patient.  Questions have been answered.  All parties agreeable.   Birder Robson, MD  03/08/2021, 12:35 PM

## 2021-03-08 NOTE — Op Note (Signed)
PREOPERATIVE DIAGNOSIS:  Nuclear sclerotic cataract of the right eye.   POSTOPERATIVE DIAGNOSIS:  Cataract   OPERATIVE PROCEDURE:ORPROCALL@   SURGEON:  Birder Robson, MD.   ANESTHESIA:  Anesthesiologist: Fidel Levy, MD CRNA: Mayme Genta, CRNA  1.      Managed anesthesia care. 2.      0.54ml of Shugarcaine was instilled in the eye following the paracentesis.   COMPLICATIONS:  None.   TECHNIQUE:   Stop and chop   DESCRIPTION OF PROCEDURE:  The patient was examined and consented in the preoperative holding area where the aforementioned topical anesthesia was applied to the right eye and then brought back to the Operating Room where the right eye was prepped and draped in the usual sterile ophthalmic fashion and a lid speculum was placed. A paracentesis was created with the side port blade and the anterior chamber was filled with viscoelastic. A near clear corneal incision was performed with the steel keratome. A continuous curvilinear capsulorrhexis was performed with a cystotome followed by the capsulorrhexis forceps. Hydrodissection and hydrodelineation were carried out with BSS on a blunt cannula. The lens was removed in a stop and chop  technique and the remaining cortical material was removed with the irrigation-aspiration handpiece. The capsular bag was inflated with viscoelastic and the Technis [id number]  lens was placed in the capsular bag without complication. The remaining viscoelastic was removed from the eye with the irrigation-aspiration handpiece. The wounds were hydrated. The anterior chamber was flushed with BSS and the eye was inflated to physiologic pressure. 0.37ml of Vigamox was placed in the anterior chamber. The wounds were found to be water tight. The eye was dressed with Combigan. The patient was given protective glasses to wear throughout the day and a shield with which to sleep tonight. The patient was also given drops with which to begin a drop regimen today and will  follow-up with me in one day. Implant Name Type Inv. Item Serial No. Manufacturer Lot No. LRB No. Used Action  LENS IOL TECNIS EYHANCE 18.5 - [id number] Intraocular Lens LENS IOL TECNIS EYHANCE 18.5 [id number] JOHNSON   Right 1 Implanted   Procedure(s) with comments: CATARACT EXTRACTION PHACO AND INTRAOCULAR LENS PLACEMENT (IOC) RIGHT (Right) - 13.01 1:15.7  Electronically signed: Birder Robson 03/08/2021 1:08 PM

## 2021-03-08 NOTE — Transfer of Care (Signed)
Immediate Anesthesia Transfer of Care Note  Patient: Ashley [name]  Procedure(s) Performed: CATARACT EXTRACTION PHACO AND INTRAOCULAR LENS PLACEMENT (IOC) RIGHT (Right: Eye)  Patient Location: PACU  Anesthesia Type: No value filed.  Level of Consciousness: awake, alert  and patient cooperative  Airway and Oxygen Therapy: Patient Spontanous Breathing and Patient connected to supplemental oxygen  Post-op Assessment: Post-op Vital signs reviewed, Patient's Cardiovascular Status Stable, Respiratory Function Stable, Patent Airway and No signs of Nausea or vomiting  Post-op Vital Signs: Reviewed and stable  Complications: No notable events documented.

## 2021-03-09 ENCOUNTER — Encounter: Payer: Self-pay | Admitting: Ophthalmology

## 2021-03-18 ENCOUNTER — Telehealth: Payer: Self-pay

## 2021-03-18 NOTE — Progress Notes (Signed)
    Chronic Care Management Pharmacy Assistant   Name: CASMIRA CRAMER  MRN: [id number] DOB: 03-11-1946  Reason for Encounter: CCM [name] ([employer]) 2023 PAP Renewal)   03/18/2021 - [name] ([employer]) PAP completed and uploaded; called patient to inform her and find out if she would like it mailed, e-mail or would like to come in the office to sign. No answer; left message.  Time Spent:   [name], CPP notified  Marijean Niemann, [place] Clinical Pharmacy Assistant [phone]

## 2021-03-21 ENCOUNTER — Telehealth: Payer: Self-pay

## 2021-03-21 NOTE — Telephone Encounter (Signed)
[facility] by [employer] requesting 90 day refill of Entresto 49-51mg . Med listed (listed under Historical provider) and 01/05/21 AVS both have medication listed to take 1 tablet by mouth daily. Please verify if these directions are correct or if patient should be taking twice daily. Thank you!  Per 01/06/20 office note, Dr. [name] states: Reports only able to tolerate Entresto 24/26 mg once daily, some limitation secondary to price   *STAT* If patient is at the pharmacy, call can be transferred to refill team.   1. Which medications need to be refilled? (please list name of each medication and dose if known) Entresto 49-51mg    2. Which pharmacy/location (including street and city if local pharmacy) is medication to be sent to? [facility] by [place] [place] [place]  3. Do they need a 30 day or 90 day supply? [place]

## 2021-03-22 MED ORDER — ENTRESTO 49-51 MG PO TABS
1.0000 | ORAL_TABLET | Freq: Every day | ORAL | 3 refills | Status: DC
Start: 2021-03-22 — End: 2021-05-26

## 2021-03-22 MED ORDER — ENTRESTO 49-51 MG PO TABS: 1.0000 | ORAL_TABLET | Freq: Every day | ORAL | 3 refills | Status: DC

## 2021-03-22 NOTE — Addendum Note (Signed)
Addended by: Valora Corporal on: 03/22/2021 11:01 AM   Modules accepted: Orders

## 2021-03-22 NOTE — Telephone Encounter (Signed)
Application placed in my "Pending Applications" folder on my cart pending her taxes. Once those are brought in we will fax to company and file in sample closet.

## 2021-03-22 NOTE — Telephone Encounter (Signed)
No answer/Voicemail box is full.  

## 2021-03-22 NOTE — Telephone Encounter (Addendum)
Spoke with patient and she confirmed dose of Entresto and is taking 49/51 mg ONCE A DAY. Refill has been sent in for this and she advised that her patient assistance application needed to be completed for next year. She has one they sent to her and after speaking with her I noticed application requirements have changed. Tried to call back to let her know but there was no answer and voicemail box was full. Sent My Chart message in hopes that she will see it before coming into office. Patient will need first 2 pages of 1040 and if no tax return she will need to call company to review her income. Will review with her when she comes in to sign forms.

## 2021-03-22 NOTE — Addendum Note (Signed)
Addended by: Valora Corporal on: 03/22/2021 02:01 PM   Modules accepted: Orders

## 2021-03-22 NOTE — Telephone Encounter (Signed)
Patient came into office to sign patient assistance application. Reviewed that due to recent changes we will need her 1040 tax return to send with application. She states her husband is out of town and will bring that in on Monday. Will complete provider portion then place in my "Pending Applications" folder on my cart. Once additional information is received I will fax all necessary documents to company. She was appreciative for the help.

## 2021-03-24 DIAGNOSIS — Z01419 Encounter for gynecological examination (general) (routine) without abnormal findings: Secondary | ICD-10-CM | POA: Diagnosis not present

## 2021-03-24 DIAGNOSIS — Z01411 Encounter for gynecological examination (general) (routine) with abnormal findings: Secondary | ICD-10-CM | POA: Diagnosis not present

## 2021-03-24 DIAGNOSIS — B372 Candidiasis of skin and nail: Secondary | ICD-10-CM | POA: Diagnosis not present

## 2021-03-24 DIAGNOSIS — Z779 Other contact with and (suspected) exposures hazardous to health: Secondary | ICD-10-CM | POA: Diagnosis not present

## 2021-03-24 DIAGNOSIS — Z1231 Encounter for screening mammogram for malignant neoplasm of breast: Secondary | ICD-10-CM | POA: Diagnosis not present

## 2021-03-24 DIAGNOSIS — M8580 Other specified disorders of bone density and structure, unspecified site: Secondary | ICD-10-CM | POA: Diagnosis not present

## 2021-03-24 DIAGNOSIS — Z124 Encounter for screening for malignant neoplasm of cervix: Secondary | ICD-10-CM | POA: Diagnosis not present

## 2021-03-24 DIAGNOSIS — Z6831 Body mass index (BMI) 31.0-31.9, adult: Secondary | ICD-10-CM | POA: Diagnosis not present

## 2021-03-24 DIAGNOSIS — R3915 Urgency of urination: Secondary | ICD-10-CM | POA: Diagnosis not present

## 2021-03-24 LAB — HM MAMMOGRAPHY

## 2021-03-24 NOTE — Telephone Encounter (Signed)
Upon chart review, patient is completing Entresto PAP through cardiology office. No further action needed.   [name], PharmD Clinical Pharmacist [facility] [phone]

## 2021-03-28 NOTE — Telephone Encounter (Signed)
Application with income documentation faxed to company for assistance consideration.

## 2021-03-28 NOTE — Telephone Encounter (Signed)
Patient dropped off tax forms & placed in box

## 2021-03-28 NOTE — Telephone Encounter (Signed)
Patient calling to check on status - would like to speak with Pam Patient is completely out of [place] for the rest of year

## 2021-03-28 NOTE — Telephone Encounter (Signed)
Spoke with patient and reviewed that application has been sent in and pending approval. She reports taking her last pill today and does not have anymore to last her through the end of December. Reviewed that I have one bottle available to hopefully last until we hear back from her application. Advised I would leave that up front for her to pick up. She was appreciative for that and states she contacted rep with company for one more bottle to last her through December. Advised we get limited samples and that we should hear back from them before these run out. She currently takes Entresto 49-51 mg once a day and the sample bottle has 28 tablets. Will update Dr. Donivan Scull nurse that application was completed, faxed, and one bottle of samples were placed up front for her to pick up. No further needs.  Medication Samples have been provided to the patient.  Drug name: [name]        Strength: 49-51 mg         Qty: 1 bottle   LOT: [id number]   Exp.Date: May 2024

## 2021-03-29 NOTE — Telephone Encounter (Signed)
Patient came by office to pick up samples States that she spoke with pharmaceutical rep and they did notice that she was due one more shipment They are sending an overnight express of [name] so she will have some for the rest of the year

## 2021-03-29 NOTE — Telephone Encounter (Signed)
Spoke with patient and she reports that company had one more month they were sending her so she now has enough to last through the year. Confirmed that application was in fact sent in via fax with all necessary documentation. Advised they would be in touch if she qualifies for further assistance. She verbalized understanding and no other questions for now.

## 2021-03-29 NOTE — Progress Notes (Signed)
[employer] was called due to patient has ran out of [place] and needed her last refill for the year 2022. Spoke with pharmacist and coordinated delivery for tomorrow 03/30/21 for 90ds  [name], CPP notified  Avel Sensor, [place] Assistant [phone]  Total time spent for month CPA: 20 min.

## 2021-04-01 ENCOUNTER — Other Ambulatory Visit: Payer: Self-pay | Admitting: Family Medicine

## 2021-04-01 ENCOUNTER — Other Ambulatory Visit (INDEPENDENT_AMBULATORY_CARE_PROVIDER_SITE_OTHER): Payer: Medicare Other

## 2021-04-01 ENCOUNTER — Other Ambulatory Visit: Payer: Medicare Other

## 2021-04-01 ENCOUNTER — Other Ambulatory Visit: Payer: Self-pay

## 2021-04-01 DIAGNOSIS — N1831 Chronic kidney disease, stage 3a: Secondary | ICD-10-CM

## 2021-04-01 DIAGNOSIS — E782 Mixed hyperlipidemia: Secondary | ICD-10-CM

## 2021-04-01 DIAGNOSIS — R7303 Prediabetes: Secondary | ICD-10-CM

## 2021-04-01 LAB — CBC WITH DIFFERENTIAL/PLATELET
Basophils Absolute: 0.1 10*3/uL (ref 0.0–0.1)
Basophils Relative: 0.6 % (ref 0.0–3.0)
Eosinophils Absolute: 0.1 10*3/uL (ref 0.0–0.7)
Eosinophils Relative: 1.7 % (ref 0.0–5.0)
HCT: 39.3 % (ref 36.0–46.0)
Hemoglobin: 12.8 g/dL (ref 12.0–15.0)
Lymphocytes Relative: 25.9 % (ref 12.0–46.0)
Lymphs Abs: 2.2 10*3/uL (ref 0.7–4.0)
MCHC: 32.4 g/dL (ref 30.0–36.0)
MCV: 90.6 fl (ref 78.0–100.0)
Monocytes Absolute: 0.8 10*3/uL (ref 0.1–1.0)
Monocytes Relative: 9.2 % (ref 3.0–12.0)
Neutro Abs: 5.2 10*3/uL (ref 1.4–7.7)
Neutrophils Relative %: 62.6 % (ref 43.0–77.0)
Platelets: 201 10*3/uL (ref 150.0–400.0)
RBC: 4.34 Mil/uL (ref 3.87–5.11)
RDW: 12.7 % (ref 11.5–15.5)
WBC: 8.3 10*3/uL (ref 4.0–10.5)

## 2021-04-01 LAB — HEMOGLOBIN A1C: Hgb A1c MFr Bld: 5.9 % (ref 4.6–6.5)

## 2021-04-01 LAB — COMPREHENSIVE METABOLIC PANEL
ALT: 24 U/L (ref 0–35)
AST: 25 U/L (ref 0–37)
Albumin: 4.4 g/dL (ref 3.5–5.2)
Alkaline Phosphatase: 95 U/L (ref 39–117)
BUN: 24 mg/dL — ABNORMAL HIGH (ref 6–23)
CO2: 24 mEq/L (ref 19–32)
Calcium: 10.3 mg/dL (ref 8.4–10.5)
Chloride: 105 mEq/L (ref 96–112)
Creatinine, Ser: 0.91 mg/dL (ref 0.40–1.20)
GFR: 61.63 mL/min (ref >60.00)
Glucose, Bld: 97 mg/dL (ref 70–99)
Potassium: 4.5 mEq/L (ref 3.5–5.1)
Sodium: 139 mEq/L (ref 135–145)
Total Bilirubin: 0.3 mg/dL (ref 0.2–1.2)
Total Protein: 7.7 g/dL (ref 6.0–8.3)

## 2021-04-01 LAB — LIPID PANEL
Cholesterol: 194 mg/dL (ref 0–200)
HDL: 49.9 mg/dL (ref >39.00)
LDL Cholesterol: 108 mg/dL — ABNORMAL HIGH (ref 0–99)
NonHDL: 144.32
Total CHOL/HDL Ratio: 4
Triglycerides: 183 mg/dL — ABNORMAL HIGH (ref 0.0–149.0)
VLDL: 36.6 mg/dL (ref 0.0–40.0)

## 2021-04-01 LAB — VITAMIN D 25 HYDROXY (VIT D DEFICIENCY, FRACTURES): VITD: 27.58 ng/mL — ABNORMAL LOW (ref 30.00–100.00)

## 2021-04-02 LAB — PARATHYROID HORMONE, INTACT (NO CA): PTH: 37 pg/mL (ref 16–77)

## 2021-04-05 ENCOUNTER — Other Ambulatory Visit: Payer: Self-pay

## 2021-04-05 ENCOUNTER — Telehealth (INDEPENDENT_AMBULATORY_CARE_PROVIDER_SITE_OTHER): Payer: Medicare Other | Admitting: Family Medicine

## 2021-04-05 ENCOUNTER — Encounter: Payer: Self-pay | Admitting: Family Medicine

## 2021-04-05 VITALS — BP 115/58 | HR 95 | Temp 98.0°F | Ht 65.0 in | Wt 180.0 lb

## 2021-04-05 DIAGNOSIS — N1831 Chronic kidney disease, stage 3a: Secondary | ICD-10-CM | POA: Diagnosis not present

## 2021-04-05 DIAGNOSIS — J4530 Mild persistent asthma, uncomplicated: Secondary | ICD-10-CM | POA: Diagnosis not present

## 2021-04-05 DIAGNOSIS — I5022 Chronic systolic (congestive) heart failure: Secondary | ICD-10-CM | POA: Diagnosis not present

## 2021-04-05 DIAGNOSIS — J418 Mixed simple and mucopurulent chronic bronchitis: Secondary | ICD-10-CM | POA: Diagnosis not present

## 2021-04-05 MED ORDER — LEVOFLOXACIN 500 MG PO TABS: 500.0000 mg | ORAL_TABLET | Freq: Every day | ORAL | 0 refills | Status: AC

## 2021-04-05 MED ORDER — PREDNISONE 20 MG PO TABS: ORAL_TABLET | ORAL | 0 refills | Status: DC

## 2021-04-05 NOTE — Assessment & Plan Note (Addendum)
Anticipate exacerbation of chronic bronchitis.  Previously has not responded to augmentin course.  Given age and comorbidities, treat with levaquin 500mg  7d course (latest GFR 61) and prednisone taper. Update if not improving as expected with above treatment.  Continue supportive care as up to now.  She will also refill tessalon perls.

## 2021-04-05 NOTE — Progress Notes (Signed)
Patient ID: [name], female    DOB: [birth year], 74 y.o.   MRN: [id number]  Virtual visit completed through [place], a video enabled telemedicine application. Due to national recommendations of social distancing due to COVID-19, a virtual visit is felt to be most appropriate for this patient at this time. Reviewed limitations, risks, security and privacy concerns of performing a virtual visit and the availability of in person appointments. I also reviewed that there may be a patient responsible charge related to this service. The patient agreed to proceed.   Patient location: home Provider location: [place] at [facility], office Persons participating in this virtual visit: patient, provider   If any vitals were documented, they were collected by patient at home unless specified below.    BP (!) 115/58   Pulse 95   Temp 98 F (36.7 C)   Ht 5\' 5"  (1.651 m)   Wt 180 lb (81.6 kg)   BMI 29.95 kg/m    CC: sinus congestion "I think I'm going into bronchitis" Subjective:   HPI: Ashley [name] is a 75 y.o. female presenting on 04/05/2021 for Sinus Problem (C/o chills, fever- max 100.1, prod cough- with greenish mucous, watery eyes and teeth pain.  Sxs started 04/01/21. Tried magnesium, zinc, Mucinex and Delsym.  )   5d h/o low grade fever Tmax 100.1 with chills, productive cough of yellow/green mucous, watery eyes, hoarseness, mild lower tooth ache, some wheezing over the weekend. Symptoms progressively worsening.  She used O2 overnight which has helped cough.   No ear , ST, PNdrainage, new diarrhea, abd pain, nausea. No loss of taste or smell.   Treating with magnesium, zinc, D3, CoQ 10, mucinex and delsym.  She regularly takes qvar 1-2 times daily, has not needed albuterol. Not on nasacort for a long time (2 yrs) due to nosebleeds.  No known sick contacts.   COVID 11/2020 s/p paxlovid which led to hospitalization from dehydration.      Relevant past medical,  surgical, family and social history reviewed and updated as indicated. Interim medical history since our last visit reviewed. Allergies and medications reviewed and updated. Outpatient Medications Prior to Visit  Medication Sig Dispense Refill   acetaminophen (TYLENOL) 500 MG tablet Take 500 mg by mouth daily as needed for moderate pain or headache (PAIN).      AMBULATORY NON FORMULARY MEDICATION Medication Name:  1 squatty potty to use as needed 1 each 0   Ascorbic Acid (VITAMIN C) 1000 MG tablet Take 1,000 mg by mouth See admin instructions. Take 1000 mg daily, may take an additional 1000 mg as needed for cold symptoms     aspirin EC 81 MG tablet Take 81 mg by mouth at bedtime.      beclomethasone (QVAR) 80 MCG/ACT inhaler Inhale 2 puffs into the lungs 2 (two) times daily. 1 each 5   benzonatate (TESSALON) 200 MG capsule Take 1 capsule (200 mg total) by mouth 3 (three) times daily as needed for cough. 30 capsule 6   Biotin 10 MG CAPS Take 1 tablet by mouth daily.     carvedilol (COREG) 6.25 MG tablet Take 0.5 tablets (3.125 mg total) by mouth 2 (two) times daily with a meal. 90 tablet 1   cholecalciferol (VITAMIN D) 1000 units tablet Take 1,000 Units by mouth daily.      Coenzyme Q10 (COQ-10) 100 MG CAPS Take 100 mg by mouth daily.     dextromethorphan (DELSYM) 30 MG/5ML liquid Take 15 mg  by mouth daily as needed for cough.      ezetimibe (ZETIA) 10 MG tablet Take 1 tablet (10 mg total) by mouth daily. 90 tablet 3   fluorouracil (EFUDEX) 5 % cream Apply topically. (Patient not taking: No sig reported)     GARLIC PO Take 1 capsule by mouth daily.     guaiFENesin (MUCINEX) 600 MG 12 hr tablet Take 600 mg by mouth 2 (two) times daily.     levalbuterol (XOPENEX HFA) 45 MCG/ACT inhaler Inhale 2 puffs into the lungs every 6 (six) hours as needed for wheezing or shortness of breath. 15 g 3   LORazepam (ATIVAN) 0.5 MG tablet Take 0.5-1 tablets (0.25-0.5 mg total) by mouth at bedtime as needed for  anxiety or sleep. (Patient not taking: No sig reported) 30 tablet 1   magnesium 30 MG tablet Take 30 mg by mouth daily.     Menthol, Topical Analgesic, (BIOFREEZE ROLL-ON EX) Apply topically.     miconazole (MICOTIN) 2 % powder Apply 1 application topically as needed (yeast).     nystatin cream (MYCOSTATIN) Apply 1 application topically as needed for dry skin (yeast infection). 30 g 3   Omega-3 Fatty Acids (FISH OIL) 1000 MG CAPS Take 1 capsule (1,000 mg total) by mouth daily.  0   OXYGEN Inhale into the lungs as needed.     pantoprazole (PROTONIX) 40 MG tablet Take 1 tablet (40 mg total) by mouth every Monday, Wednesday, and Friday.     potassium chloride (KLOR-CON) 10 MEQ tablet Take 1 tablet (10 mEq total) by mouth 3 (three) times a week. 40 tablet 1   pyridOXINE (VITAMIN B-6) 100 MG tablet Take 100 mg by mouth daily.     Red Yeast Rice Extract (RED YEAST RICE PO) Take 1 capsule by mouth in the morning and at bedtime.     sacubitril-valsartan (ENTRESTO) 49-51 MG Take 1 tablet by mouth daily. 90 tablet 3   spironolactone (ALDACTONE) 25 MG tablet Take 1 tablet (25 mg total) by mouth daily. 90 tablet 1   torsemide (DEMADEX) 10 MG tablet Take 1 tablet (10 mg total) by mouth 3 (three) times a week. 40 tablet 1   triamcinolone (NASACORT) 55 MCG/ACT AERO nasal inhaler Place into the nose.     vitamin E 400 UNIT capsule Take 400 Units by mouth daily.     No facility-administered medications prior to visit.     Per HPI unless specifically indicated in ROS section below Review of Systems Objective:  BP (!) 115/58   Pulse 95   Temp 98 F (36.7 C)   Ht 5\' 5"  (1.651 m)   Wt 180 lb (81.6 kg)   BMI 29.95 kg/m   Wt Readings from Last 3 Encounters:  04/05/21 180 lb (81.6 kg)  03/08/21 187 lb 6.3 oz (85 kg)  03/04/21 188 lb (85.3 kg)       Physical exam: Gen: alert, NAD, tired appearing Pulm: speaks in complete sentences without increased work of breathing, intermittent deep wet sounding  cough present Psych: normal mood, normal thought content      Lab Results  Component Value Date   CREATININE 0.91 04/01/2021   BUN 24 (H) 04/01/2021   NA 139 04/01/2021   K 4.5 04/01/2021   CL 105 04/01/2021   CO2 24 04/01/2021    Assessment & Plan:   Problem List Items Addressed This Visit     Chronic bronchitis ([place]) - Primary    Anticipate exacerbation of chronic  bronchitis.  Previously has not responded to augmentin course.  Given age and comorbidities, treat with levaquin 500mg  7d course (latest GFR 61) and prednisone taper. Update if not improving as expected with above treatment.  Continue supportive care as up to now.  She will also refill tessalon perls.       Chronic systolic CHF (congestive heart failure) (HCC)   CKD (chronic kidney disease) stage 3, GFR 30-59 ml/min (HCC)   Asthma   Relevant Medications   predniSONE (DELTASONE) 20 MG tablet     Meds ordered this encounter  Medications   predniSONE (DELTASONE) 20 MG tablet    Sig: Take two tablets daily for 3 days followed by one tablet daily for 4 days    Dispense:  10 tablet    Refill:  0   levofloxacin (LEVAQUIN) 500 MG tablet    Sig: Take 1 tablet (500 mg total) by mouth daily for 7 days.    Dispense:  7 tablet    Refill:  0    No orders of the defined types were placed in this encounter.   I discussed the assessment and treatment plan with the patient. The patient was provided an opportunity to ask questions and all were answered. The patient agreed with the plan and demonstrated an understanding of the instructions. The patient was advised to call back or seek an in-person evaluation if the symptoms worsen or if the condition fails to improve as anticipated.  Follow up plan: No follow-ups on file.  [name], MD

## 2021-04-08 ENCOUNTER — Encounter: Payer: Medicare Other | Admitting: Family Medicine
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
## 2021-04-11 ENCOUNTER — Ambulatory Visit (INDEPENDENT_AMBULATORY_CARE_PROVIDER_SITE_OTHER): Payer: Medicare Other | Admitting: Cardiovascular Disease

## 2021-04-11 ENCOUNTER — Encounter: Payer: Self-pay | Admitting: Cardiovascular Disease

## 2021-04-11 ENCOUNTER — Other Ambulatory Visit: Payer: Self-pay

## 2021-04-11 VITALS — BP 100/60 | HR 74 | Ht 65.0 in | Wt 184.0 lb

## 2021-04-11 DIAGNOSIS — I5020 Unspecified systolic (congestive) heart failure: Secondary | ICD-10-CM

## 2021-04-11 DIAGNOSIS — Z95810 Presence of automatic (implantable) cardiac defibrillator: Secondary | ICD-10-CM

## 2021-04-11 DIAGNOSIS — I10 Essential (primary) hypertension: Secondary | ICD-10-CM | POA: Diagnosis not present

## 2021-04-11 DIAGNOSIS — E782 Mixed hyperlipidemia: Secondary | ICD-10-CM | POA: Diagnosis not present

## 2021-04-11 DIAGNOSIS — I700 Atherosclerosis of aorta: Secondary | ICD-10-CM | POA: Diagnosis not present

## 2021-04-11 DIAGNOSIS — I5022 Chronic systolic (congestive) heart failure: Secondary | ICD-10-CM

## 2021-04-11 DIAGNOSIS — I420 Dilated cardiomyopathy: Secondary | ICD-10-CM | POA: Diagnosis not present

## 2021-04-11 NOTE — Patient Instructions (Addendum)
Medication Instructions:  ?No changes ? ?If you need a refill on your cardiac medications before your next appointment, please call your pharmacy.  ? ?Lab work: ?No new labs needed ? ?Testing/Procedures: ?No new testing needed ? ?Follow-Up: ?At [facility], you and your health needs are our priority.  As part of our continuing mission to provide you with exceptional heart care, we have created designated Provider Care Teams.  These Care Teams include your primary Cardiologist (physician) and Advanced Practice Providers (APPs -  Physician Assistants and Nurse Practitioners) who all work together to provide you with the care you need, when you need it. ? ?You will need a follow up appointment in 6 months, APP ok ? ?Providers on your designated Care Team:   ?Christopher Berge, NP ?Ryan Dunn, PA-C ?Cadence Furth, PA-C ? ?COVID-19 Vaccine Information can be found at: [email or web address] For questions related to vaccine distribution or appointments, please email [email or web address] or call [phone].  ? ?

## 2021-04-15 ENCOUNTER — Encounter: Payer: Self-pay | Admitting: Family Medicine

## 2021-04-15 ENCOUNTER — Ambulatory Visit (INDEPENDENT_AMBULATORY_CARE_PROVIDER_SITE_OTHER): Payer: Medicare Other | Admitting: Family Medicine

## 2021-04-15 ENCOUNTER — Other Ambulatory Visit: Payer: Self-pay

## 2021-04-15 VITALS — BP 124/60 | HR 82 | Temp 97.6°F | Ht 64.5 in | Wt 184.0 lb

## 2021-04-15 DIAGNOSIS — Z7189 Other specified counseling: Secondary | ICD-10-CM

## 2021-04-15 DIAGNOSIS — N1831 Chronic kidney disease, stage 3a: Secondary | ICD-10-CM | POA: Diagnosis not present

## 2021-04-15 DIAGNOSIS — G4734 Idiopathic sleep related nonobstructive alveolar hypoventilation: Secondary | ICD-10-CM

## 2021-04-15 DIAGNOSIS — I700 Atherosclerosis of aorta: Secondary | ICD-10-CM | POA: Diagnosis not present

## 2021-04-15 DIAGNOSIS — I420 Dilated cardiomyopathy: Secondary | ICD-10-CM

## 2021-04-15 DIAGNOSIS — Z0000 Encounter for general adult medical examination without abnormal findings: Secondary | ICD-10-CM | POA: Diagnosis not present

## 2021-04-15 DIAGNOSIS — D803 Selective deficiency of immunoglobulin G [IgG] subclasses: Secondary | ICD-10-CM

## 2021-04-15 DIAGNOSIS — K219 Gastro-esophageal reflux disease without esophagitis: Secondary | ICD-10-CM

## 2021-04-15 DIAGNOSIS — R1084 Generalized abdominal pain: Secondary | ICD-10-CM | POA: Diagnosis not present

## 2021-04-15 DIAGNOSIS — F331 Major depressive disorder, recurrent, moderate: Secondary | ICD-10-CM

## 2021-04-15 DIAGNOSIS — J9611 Chronic respiratory failure with hypoxia: Secondary | ICD-10-CM

## 2021-04-15 DIAGNOSIS — Z789 Other specified health status: Secondary | ICD-10-CM

## 2021-04-15 DIAGNOSIS — M797 Fibromyalgia: Secondary | ICD-10-CM | POA: Diagnosis not present

## 2021-04-15 DIAGNOSIS — L659 Nonscarring hair loss, unspecified: Secondary | ICD-10-CM

## 2021-04-15 DIAGNOSIS — J4530 Mild persistent asthma, uncomplicated: Secondary | ICD-10-CM | POA: Diagnosis not present

## 2021-04-15 DIAGNOSIS — E559 Vitamin D deficiency, unspecified: Secondary | ICD-10-CM

## 2021-04-15 DIAGNOSIS — E782 Mixed hyperlipidemia: Secondary | ICD-10-CM

## 2021-04-15 DIAGNOSIS — G8929 Other chronic pain: Secondary | ICD-10-CM

## 2021-04-15 DIAGNOSIS — I5022 Chronic systolic (congestive) heart failure: Secondary | ICD-10-CM

## 2021-04-15 DIAGNOSIS — M810 Age-related osteoporosis without current pathological fracture: Secondary | ICD-10-CM

## 2021-04-15 DIAGNOSIS — Z95810 Presence of automatic (implantable) cardiac defibrillator: Secondary | ICD-10-CM | POA: Diagnosis not present

## 2021-04-15 LAB — IBC PANEL
Iron: 93 ug/dL (ref 42–145)
Saturation Ratios: 22.3 % (ref 20.0–50.0)
TIBC: 417.2 ug/dL (ref 250.0–450.0)
Transferrin: 298 mg/dL (ref 212.0–360.0)

## 2021-04-15 LAB — FERRITIN: Ferritin: 53.3 ng/mL (ref 10.0–291.0)

## 2021-04-15 MED ORDER — NYSTATIN 100000 UNIT/GM EX CREA: 1.0000 "application " | TOPICAL_CREAM | CUTANEOUS | 3 refills | Status: DC | PRN

## 2021-04-15 MED ORDER — VITAMIN D3 50 MCG (2000 UT) PO CAPS: 2000.0000 [IU] | ORAL_CAPSULE | Freq: Every day | ORAL | Status: AC

## 2021-04-15 NOTE — Patient Instructions (Addendum)
Labs today  We will request recent mammogram from [facility]. Nystatin refilled.  Vitamin D was low - increase to 2000 units daily.  You are doing well today.  Return as needed or in 6 months for follow up visit.   Health Maintenance After Age 75 After age 74, you are at a higher risk for certain long-term diseases and infections as well as injuries from falls. Falls are a major cause of broken bones and head injuries in people who are older than age 58. Getting regular preventive care can help to keep you healthy and well. Preventive care includes getting regular testing and making lifestyle changes as recommended by your health care provider. Talk with your health care provider about: Which screenings and tests you should have. A screening is a test that checks for a disease when you have no symptoms. A diet and exercise plan that is right for you. What should I know about screenings and tests to prevent falls? Screening and testing are the best ways to find a health problem early. Early diagnosis and treatment give you the best chance of managing medical conditions that are common after age 4. Certain conditions and lifestyle choices may make you more likely to have a fall. Your health care provider may recommend: Regular vision checks. Poor vision and conditions such as cataracts can make you more likely to have a fall. If you wear glasses, make sure to get your prescription updated if your vision changes. Medicine review. Work with your health care provider to regularly review all of the medicines you are taking, including over-the-counter medicines. Ask your health care provider about any side effects that may make you more likely to have a fall. Tell your health care provider if any medicines that you take make you feel dizzy or sleepy. Strength and balance checks. Your health care provider may recommend certain tests to check your strength and balance while standing, walking, or changing  positions. Foot health exam. Foot pain and numbness, as well as not wearing proper footwear, can make you more likely to have a fall. Screenings, including: Osteoporosis screening. Osteoporosis is a condition that causes the bones to get weaker and break more easily. Blood pressure screening. Blood pressure changes and medicines to control blood pressure can make you feel dizzy. Depression screening. You may be more likely to have a fall if you have a fear of falling, feel depressed, or feel unable to do activities that you used to do. Alcohol use screening. Using too much alcohol can affect your balance and may make you more likely to have a fall. Follow these instructions at home: Lifestyle Do not drink alcohol if: Your health care provider tells you not to drink. If you drink alcohol: Limit how much you have to: 0-1 drink a day for women. 0-2 drinks a day for men. Know how much alcohol is in your drink. In the [place] one drink equals one 12 oz bottle of beer (355 mL), one 5 oz glass of wine (148 mL), or one 1 oz glass of hard liquor (44 mL). Do not use any products that contain nicotine or tobacco. These products include cigarettes, chewing tobacco, and vaping devices, such as e-cigarettes. If you need help quitting, ask your health care provider. Activity  Follow a regular exercise program to stay fit. This will help you maintain your balance. Ask your health care provider what types of exercise are appropriate for you. If you need a cane or walker, use it  as recommended by your health care provider. Wear supportive shoes that have nonskid soles. Safety  Remove any tripping hazards, such as rugs, cords, and clutter. Install safety equipment such as grab bars in bathrooms and safety rails on stairs. Keep rooms and walkways well-lit. General instructions Talk with your health care provider about your risks for falling. Tell your health care provider if: You fall. Be sure to tell your  health care provider about all falls, even ones that seem minor. You feel dizzy, tiredness (fatigue), or off-balance. Take over-the-counter and prescription medicines only as told by your health care provider. These include supplements. Eat a healthy diet and maintain a healthy weight. A healthy diet includes low-fat dairy products, low-fat (lean) meats, and fiber from whole grains, beans, and lots of fruits and vegetables. Stay current with your vaccines. Schedule regular health, dental, and eye exams. Summary Having a healthy lifestyle and getting preventive care can help to protect your health and wellness after age 74. Screening and testing are the best way to find a health problem early and help you avoid having a fall. Early diagnosis and treatment give you the best chance for managing medical conditions that are more common for people who are older than age 54. Falls are a major cause of broken bones and head injuries in people who are older than age 51. Take precautions to prevent a fall at home. Work with your health care provider to learn what changes you can make to improve your health and wellness and to prevent falls. This information is not intended to replace advice given to you by your health care provider. Make sure you discuss any questions you have with your health care provider. Document Revised: 09/20/2020 Document Reviewed: 09/20/2020 Elsevier Patient Education  [place].

## 2021-04-15 NOTE — Progress Notes (Signed)
Patient ID: Ashley [name], female    DOB: [birth year], 75 y.o.   MRN: [id number]  This visit was conducted in person.  BP 124/60   Pulse 82   Temp 97.6 F (36.4 C) (Temporal)   Ht 5' 4.5" (1.638 m)   Wt 184 lb (83.5 kg)   SpO2 96%   BMI 31.10 kg/m    CC: AMW  Subjective:   HPI: Ashley [name] is a 75 y.o. female presenting on 04/15/2021 for Medicare Wellness   Did not see health advisor.   Hearing Screening   500Hz 1000Hz 2000Hz 4000Hz  Right ear 40 40 40 40  Left ear 40 40 40 40  Vision Screening - Comments:: Last eye exam, 03/2021.  [place] Office Visit from 04/15/2021 in [place] [facility] [place]  PHQ-2 Total Score 4       Fall Risk  04/15/2021 04/15/2021 02/07/2021 12/01/2019 09/30/2019  Falls in the past year? 1 0 0 0 1  Comment - - - - -  Number falls in past yr: 0 - 0 0 1  Comment - - - - previous fall broken 6 ribs  Injury with Fall? 0 - 0 0 0  Risk Factor Category  - - - - -  Risk for fall due to : - - - Medication side effect Impaired balance/gait;History of fall(s);Medication side effect  Follow up - - - Falls evaluation completed;Falls prevention discussed Falls prevention discussed  [address] - tripped over oxygen tank/cord, no injury.  Seen last week with acute bronchitis treated with levaquin course and prednisone taper and tessalon perls - symptoms have largely resolved although notes still bringing up mucous.   Established with pulm [name]) for asthma with reassuring PFTs. Continues Qvar BID with xopenex inh PRN and oxygen PRN and at bedtime.   Sees cardiology Dr [name] for chronic sCHF, on low dose entresto and torsemide 23m once a week with potassium as well as spironolactone and carvedilol. Did not tolerate FIran   Preventative: COLONOSCOPY 01/2018 - 1 small benign polyp, diverticulosis, healthy anastomosis ([name]@ [facility])  [facility] - s/p abd hysterectomy with BSO.  Breast cancer  screening - [facility] 02/2019 [place] [facility] - T -2.1 spine - on fosamax for at least 10 yrs along with calcium + vitamin D. Fosamax stopped 2019.  Lung cancer screening - not indicated  Flu shot - declines - egg allergy  COVID vaccine - discussed, declines  Td 2007  Pneumovax 2013, prevnar-13 2017 - bad reaction to this Shingrix - discussed, declines  Advanced directive - scanned and in chart 01/2021. [facility] Does not want prolongation of life if incurable terminal condition resulting in death within short period of time.  Seat belt use discussed  Sunscreen use discussed. No changing moles on skin. Sees derm regularly (Dr GPhillip Healin [place].  Non smoker  Alcohol - none  Dentist - saw this year, crown placed  Eye exam - yearly  Bowel - alternates constipation / diarrhea, chronic Bladder - rare incontinence  Lives with husband, RHerbie [place] 1 adopted child  Activity: no regular exercise - limited by pain  Diet: seldom water, good fruits/vegetables      Relevant past medical, surgical, family and social history reviewed and updated as indicated. Interim medical history since our last visit reviewed. Allergies and medications reviewed and updated. Outpatient  Medications Prior to Visit  Medication Sig Dispense Refill   acetaminophen (TYLENOL) 500 MG tablet Take 500 mg by mouth daily as needed for moderate pain or headache (PAIN).      AMBULATORY NON FORMULARY MEDICATION Medication Name:  1 squatty potty to use as needed 1 each 0   Ascorbic Acid (VITAMIN C) 1000 MG tablet Take 1,000 mg by mouth See admin instructions. Take 1000 mg daily, may take an additional 1000 mg as needed for cold symptoms     aspirin EC 81 MG tablet Take 81 mg by mouth at bedtime.      beclomethasone (QVAR) 80 MCG/ACT inhaler Inhale 2 puffs into the lungs 2 (two) times daily. 1 each 5   benzonatate (TESSALON) 200 MG capsule  Take 1 capsule (200 mg total) by mouth 3 (three) times daily as needed for cough. 30 capsule 6   Biotin 10 MG CAPS Take 1 tablet by mouth daily.     carvedilol (COREG) 6.25 MG tablet Take 0.5 tablets (3.125 mg total) by mouth 2 (two) times daily with a meal. 90 tablet 1   Coenzyme Q10 (COQ-10) 100 MG CAPS Take 100 mg by mouth daily.     dextromethorphan (DELSYM) 30 MG/5ML liquid Take 15 mg by mouth daily as needed for cough.      ezetimibe (ZETIA) 10 MG tablet Take 1 tablet (10 mg total) by mouth daily. 90 tablet 3   fluorouracil (EFUDEX) 5 % cream Apply topically.     GARLIC PO Take 1 capsule by mouth daily.     guaiFENesin (MUCINEX) 600 MG 12 hr tablet Take 600 mg by mouth 2 (two) times daily.     levalbuterol (XOPENEX HFA) 45 MCG/ACT inhaler Inhale 2 puffs into the lungs every 6 (six) hours as needed for wheezing or shortness of breath. 15 g 3   LORazepam (ATIVAN) 0.5 MG tablet Take 0.5-1 tablets (0.25-0.5 mg total) by mouth at bedtime as needed for anxiety or sleep. 30 tablet 1   magnesium 30 MG tablet Take 30 mg by mouth daily.     Menthol, Topical Analgesic, (BIOFREEZE ROLL-ON EX) Apply topically.     miconazole (MICOTIN) 2 % powder Apply 1 application topically as needed (yeast).     Omega-3 Fatty Acids (FISH OIL) 1000 MG CAPS Take 1 capsule (1,000 mg total) by mouth daily.  0   OXYGEN Inhale into the lungs as needed.     pantoprazole (PROTONIX) 40 MG tablet Take 1 tablet (40 mg total) by mouth every Monday, Wednesday, and Friday.     potassium chloride (KLOR-CON) 10 MEQ tablet Take 1 tablet (10 mEq total) by mouth 3 (three) times a week. 40 tablet 1   predniSONE (DELTASONE) 20 MG tablet Take two tablets daily for 3 days followed by one tablet daily for 4 days 10 tablet 0   pyridOXINE (VITAMIN B-6) 100 MG tablet Take 100 mg by mouth daily.     Red Yeast Rice Extract (RED YEAST RICE PO) Take 1 capsule by mouth in the morning and at bedtime.     sacubitril-valsartan (ENTRESTO) 49-51 MG Take  1 tablet by mouth daily. 90 tablet 3   spironolactone (ALDACTONE) 25 MG tablet Take 1 tablet (25 mg total) by mouth daily. 90 tablet 1   torsemide (DEMADEX) 10 MG tablet Take 1 tablet (10 mg total) by mouth 3 (three) times a week. 40 tablet 1   triamcinolone (NASACORT) 55 MCG/ACT AERO nasal inhaler Place into the nose.  vitamin E 400 UNIT capsule Take 400 Units by mouth daily.     cholecalciferol (VITAMIN D) 1000 units tablet Take 1,000 Units by mouth daily.      nystatin cream (MYCOSTATIN) Apply 1 application topically as needed for dry skin (yeast infection). 30 g 3   No facility-administered medications prior to visit.     Per HPI unless specifically indicated in ROS section below Review of Systems  Objective:  BP 124/60   Pulse 82   Temp 97.6 F (36.4 C) (Temporal)   Ht 5' 4.5" (1.638 m)   Wt 184 lb (83.5 kg)   SpO2 96%   BMI 31.10 kg/m   Wt Readings from Last 3 Encounters:  04/15/21 184 lb (83.5 kg)  04/11/21 184 lb (83.5 kg)  04/05/21 180 lb (81.6 kg)      Physical Exam Vitals and nursing note reviewed.  Constitutional:      Appearance: Normal appearance. She is not ill-appearing.  HENT:     Head: Normocephalic and atraumatic.     Right Ear: Tympanic membrane, ear canal and external ear normal. There is no impacted cerumen.     Left Ear: Tympanic membrane, ear canal and external ear normal. There is no impacted cerumen.  Eyes:     General:        Right eye: No discharge.        Left eye: No discharge.     Extraocular Movements: Extraocular movements intact.     Conjunctiva/sclera: Conjunctivae normal.     Pupils: Pupils are equal, round, and reactive to light.  Neck:     Thyroid: No thyroid mass or thyromegaly.  Cardiovascular:     Rate and Rhythm: Normal rate and regular rhythm.     Pulses: Normal pulses.     Heart sounds: Normal heart sounds. No murmur heard. Pulmonary:     Effort: Pulmonary effort is normal. No respiratory distress.     Breath sounds:  Normal breath sounds. No wheezing, rhonchi or rales.  Abdominal:     General: Bowel sounds are normal. There is no distension.     Palpations: Abdomen is soft. There is no mass.     Tenderness: There is no abdominal tenderness. There is no guarding or rebound.     Hernia: No hernia is present.  Musculoskeletal:     Cervical back: Normal range of motion and neck supple. No rigidity.     Right lower leg: No edema.     Left lower leg: No edema.  Lymphadenopathy:     Cervical: No cervical adenopathy.  Skin:    General: Skin is warm and dry.     Findings: No rash.  Neurological:     General: No focal deficit present.     Mental Status: She is alert. Mental status is at baseline.     Comments:  Recall 3/3 Calculation 5/5 DLROW  Psychiatric:        Mood and Affect: Mood normal.        Behavior: Behavior normal.      Results for orders placed or performed in visit on 04/15/21  IBC panel  Result Value Ref Range   Iron 93 42 - 145 ug/dL   Transferrin 298.0 212.0 - 360.0 mg/dL   Saturation Ratios 22.3 20.0 - 50.0 %   TIBC 417.2 250.0 - 450.0 mcg/dL  Ferritin  Result Value Ref Range   Ferritin 53.3 10.0 - 291.0 ng/mL   *Note: Due to a large number of results  and/or encounters for the requested time period, some results have not been displayed. A complete set of results can be found in Results Review.   Lab Results  Component Value Date   CHOL 194 04/01/2021   HDL 49.90 04/01/2021   LDLCALC 108 (H) 04/01/2021   LDLDIRECT 164.0 11/27/2018   TRIG 183.0 (H) 04/01/2021   CHOLHDL 4 04/01/2021    Lab Results  Component Value Date   CREATININE 0.91 04/01/2021   BUN 24 (H) 04/01/2021   NA 139 04/01/2021   K 4.5 04/01/2021   CL 105 04/01/2021   CO2 24 04/01/2021   Depression screen PHQ 2/9 04/15/2021 11/23/2020 01/12/2020 12/29/2019 12/01/2019  Decreased Interest 3 0 1 1 0  Down, Depressed, Hopeless 1 0 2 2 0  PHQ - 2 Score 4 0 3 3 0  Altered sleeping 3 0 3 3 0  Tired, decreased  energy _0 0  Change in appetite 0 0 2 2 0  Feeling bad or failure about yourself  0 0 0 0 0  Trouble concentrating 1 0 1 1 0  Moving slowly or fidgety/restless 0 0 0 0 0  Suicidal thoughts 0 0 0 0 0  PHQ-9 Score _1 0  Difficult doing work/chores - Not difficult at all Somewhat difficult Somewhat difficult Not difficult at all  Some recent data might be hidden    GAD 7 : Generalized Anxiety Score 04/15/2021  Nervous, Anxious, on Edge 1  Control/stop worrying 1  Worry too much - different things 0  Trouble relaxing 1  Restless 0  Easily annoyed or irritable 1  Afraid - awful might happen 0  Total GAD 7 Score 4   Assessment & Plan:  This visit occurred during the SARS-CoV-2 public health emergency.  Safety protocols were in place, including screening questions prior to the visit, additional usage of staff PPE, and extensive cleaning of exam room while observing appropriate contact time as indicated for disinfecting solutions.   Problem List Items Addressed This Visit     Medicare annual wellness visit, subsequent - Primary (Chronic)    I have personally reviewed the Medicare Annual Wellness questionnaire and have noted 1. The patient's medical and social history 2. Their use of alcohol, tobacco or illicit drugs 3. Their current medications and supplements 4. The patient's functional ability including ADL's, fall risks, home safety risks and hearing or visual impairment. Cognitive function has been assessed and addressed as indicated.  5. Diet and physical activity 6. Evidence for depression or mood disorders The patients weight, height, BMI have been recorded in the chart. I have made referrals, counseling and provided education to the patient based on review of the above and I have provided the pt with a written personalized care plan for preventive services. Provider list updated.. See scanned questionairre as needed for further documentation. Reviewed preventative  protocols and updated unless pt declined.       Advanced care planning/counseling discussion (Chronic)    Advanced directive - scanned and in chart 01/2021. [facility] and [employer] are [place]. Does not want prolongation of life if incurable terminal condition resulting in death within short period of time.       Fibromyalgia    Notes intermittent brain fog.       Chronic systolic CHF (congestive heart failure) ([place])    Appreciate cardiology care       Cardiomyopathy, dilated, nonischemic ([place])   Chronic respiratory failure with  hypoxia (HCC)    Continues PRN supplemental oxygen use at 2L via [place]      CKD (chronic kidney disease) stage 3, GFR 30-59 ml/min (HCC)    H/o this, however on recent kidney functions eGFR staying >60      Vitamin D deficiency    Levels mildly low - will increase vit D to 2000 IU daily.       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  Statin intolerance.       Osteoporosis    Followed by [facility]  Off fosamax since 2019  Continue calcium and vitamin D.       Relevant Medications   Cholecalciferol (VITAMIN D3) 50 MCG (2000 UT) capsule   GERD (gastroesophageal reflux disease)    Managed on pantoprazole MWF      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HCC)    Followed by [facility] immunology - high IgM, low IgG.       Mixed hyperlipidemia    Continues zetia and RYR in statin intolerance - recent LDL above goal. Encouraged healthy diet choices to improve LDL levels.  The 10-year ASCVD risk score (Arnett DK, et al., 2019) is: 19.8%   Values used to calculate the score:     Age: 11 years     Sex: Female     Is Non-Hispanic  African American: No     Diabetic: No     Tobacco smoker: No     Systolic Blood Pressure: 858 mmHg     Is BP treated: Yes     HDL Cholesterol: 49.9 mg/dL     Total Cholesterol: 194 mg/dL       Nocturnal hypoxia    Worse after COVID infection 72022 - -continue nightly supplemental O2      Asthma    Appreciate pulmonology care - continues daily Qvar with PRN xopenex, intermittent O2 use during day and at bedtime      Statin intolerance    Previously tried simvastatin, pravastatin, atorvastatin, rosuvastatin - myalgias      Hair loss    Notes ongoing hair loss since COVID infection 11/2020. Discussed anticipate telogen effluvium after acute stressor of illness - would anticipate this to improve. She has noted no improvement despite regularly taking biotin. Recent TSH normal. Will update iron panel to r/o iron def as contributor.       Relevant Orders   IBC panel (Completed)   Ferritin (Completed)     Meds ordered this encounter  Medications   nystatin cream (MYCOSTATIN)    Sig: Apply 1 application topically as needed for dry skin (yeast infection).    Dispense:  30 g    Refill:  3   Cholecalciferol (VITAMIN D3) 50 MCG (2000 UT) capsule    Sig: Take 1 capsule (2,000 Units total) by mouth daily.   Orders Placed This Encounter  Procedures   IBC panel   Ferritin    Patient instructions: Labs today  We will request recent mammogram from [facility]. Nystatin refilled.  Vitamin D was low - increase to 2000 units daily.  You are doing well today.  Return as needed or in 6 months for follow up visit.   Follow up plan: Return in about  6 months (around 10/14/2021) for follow up visit.  [name], MD

## 2021-04-16 DIAGNOSIS — L659 Nonscarring hair loss, unspecified: Secondary | ICD-10-CM | POA: Insufficient documentation

## 2021-04-16 DIAGNOSIS — Z789 Other specified health status: Secondary | ICD-10-CM | POA: Insufficient documentation

## 2021-04-16 NOTE — Assessment & Plan Note (Addendum)
Levels mildly low - will increase vit D to 2000 IU daily.

## 2021-04-16 NOTE — Assessment & Plan Note (Signed)
H/o this, however on recent kidney functions eGFR staying >60

## 2021-04-16 NOTE — Assessment & Plan Note (Signed)

## 2021-04-16 NOTE — Assessment & Plan Note (Signed)
Appreciate pulmonology care - continues daily Qvar with PRN xopenex, intermittent O2 use during day and at bedtime

## 2021-04-16 NOTE — Assessment & Plan Note (Signed)
Followed by [facility] Conte OBGYN  Off fosamax since 2019  Continue calcium and vitamin D.

## 2021-04-16 NOTE — Assessment & Plan Note (Signed)
Continues PRN supplemental oxygen use at 2L via [place]

## 2021-04-16 NOTE — Assessment & Plan Note (Signed)
Continues aspirin, zetia and RYR.  Statin intolerance.

## 2021-04-16 NOTE — Assessment & Plan Note (Signed)
Managed on pantoprazole MWF

## 2021-04-16 NOTE — Assessment & Plan Note (Addendum)
Previously tried simvastatin, pravastatin, atorvastatin, rosuvastatin - myalgias

## 2021-04-16 NOTE — Assessment & Plan Note (Signed)
Advanced directive - scanned and in chart 01/2021. [facility] and [employer] are [place]. Does not want prolongation of life if incurable terminal condition resulting in death within short period of time.

## 2021-04-16 NOTE — Assessment & Plan Note (Signed)
Notes ongoing hair loss since COVID infection 11/2020. Discussed anticipate telogen effluvium after acute stressor of illness - would anticipate this to improve. She has noted no improvement despite regularly taking biotin. Recent TSH normal. Will update iron panel to r/o iron def as contributor.

## 2021-04-16 NOTE — Assessment & Plan Note (Signed)
Continues PRN lorazepam, off antidepressant (intolerance to sertraline and cymbalta when previously tried)

## 2021-04-16 NOTE — Assessment & Plan Note (Signed)
Followed by [facility] immunology - high IgM, low IgG.

## 2021-04-16 NOTE — Assessment & Plan Note (Addendum)
Continues zetia and RYR in statin intolerance - recent LDL above goal. Encouraged healthy diet choices to improve LDL levels.  The 10-year ASCVD risk score (Arnett DK, et al., 2019) is: 19.8%   Values used to calculate the score:     Age: 75 years     Sex: Female     Is Non-Hispanic African American: No     Diabetic: No     Tobacco smoker: No     Systolic Blood Pressure: 268 mmHg     Is BP treated: Yes     HDL Cholesterol: 49.9 mg/dL     Total Cholesterol: 194 mg/dL

## 2021-04-16 NOTE — Assessment & Plan Note (Signed)
Appreciate cardiology care.  °

## 2021-04-16 NOTE — Assessment & Plan Note (Signed)
Notes intermittent brain fog.

## 2021-04-16 NOTE — Assessment & Plan Note (Signed)
Worse after COVID infection 72022 - -continue nightly supplemental O2

## 2021-04-19 DIAGNOSIS — Z20822 Contact with and (suspected) exposure to covid-19: Secondary | ICD-10-CM | POA: Diagnosis not present

## 2021-04-21 ENCOUNTER — Encounter: Payer: Self-pay | Admitting: Family Medicine

## 2021-05-03 ENCOUNTER — Other Ambulatory Visit: Payer: Self-pay | Admitting: Cardiovascular Disease

## 2021-05-17 ENCOUNTER — Ambulatory Visit (INDEPENDENT_AMBULATORY_CARE_PROVIDER_SITE_OTHER): Payer: Medicare Other

## 2021-05-17 DIAGNOSIS — I420 Dilated cardiomyopathy: Secondary | ICD-10-CM | POA: Diagnosis not present

## 2021-05-18 LAB — CUP PACEART REMOTE DEVICE CHECK
Battery Remaining Longevity: 51 mo
Battery Voltage: 2.97 V
Brady Statistic AP VP Percent: 0.02 %
Brady Statistic AP VS Percent: 0.03 %
Brady Statistic AS VP Percent: 99.35 %
Brady Statistic AS VS Percent: 0.6 %
Brady Statistic RA Percent Paced: 0.05 %
Brady Statistic RV Percent Paced: 99.13 %
Date Time Interrogation Session: 20230104171011
HighPow Impedance: 78 Ohm
Implantable Lead Implant Date: 20130301
Implantable Lead Implant Date: 20200622
Implantable Lead Implant Date: 20200622
Implantable Lead Location: 753859
Implantable Lead Location: 753860
Implantable Lead Location: 753860
Implantable Lead Model: 3830
Implantable Lead Model: 5076
Implantable Lead Model: 6935
Implantable Pulse Generator Implant Date: 20200622
Lead Channel Impedance Value: 247 Ohm
Lead Channel Impedance Value: 342 Ohm
Lead Channel Impedance Value: 342 Ohm
Lead Channel Impedance Value: 399 Ohm
Lead Channel Impedance Value: 437 Ohm
Lead Channel Impedance Value: 532 Ohm
Lead Channel Pacing Threshold Amplitude: 0.375 V
Lead Channel Pacing Threshold Amplitude: 0.625 V
Lead Channel Pacing Threshold Amplitude: 1.375 V
Lead Channel Pacing Threshold Pulse Width: 0.4 ms
Lead Channel Pacing Threshold Pulse Width: 0.4 ms
Lead Channel Pacing Threshold Pulse Width: 0.4 ms
Lead Channel Sensing Intrinsic Amplitude: 10 mV
Lead Channel Sensing Intrinsic Amplitude: 10 mV
Lead Channel Sensing Intrinsic Amplitude: 2.125 mV
Lead Channel Sensing Intrinsic Amplitude: 2.125 mV
Lead Channel Setting Pacing Amplitude: 1.5 V
Lead Channel Setting Pacing Amplitude: 2.5 V
Lead Channel Setting Pacing Amplitude: 2.5 V
Lead Channel Setting Pacing Pulse Width: 0.4 ms
Lead Channel Setting Pacing Pulse Width: 0.4 ms
Lead Channel Setting Sensing Sensitivity: 0.3 mV

## 2021-05-19 ENCOUNTER — Telehealth: Payer: Self-pay

## 2021-05-19 ENCOUNTER — Telehealth: Payer: Self-pay | Admitting: Cardiovascular Disease

## 2021-05-19 NOTE — Telephone Encounter (Signed)
Notified patient that she does not meet the requirements for patient assistance for Entresto due to due to household income exceeds the program limits.

## 2021-05-19 NOTE — Telephone Encounter (Signed)
Called patient regarding decrease in Effective Bi-Vp from 99.5% in Oct. 2022 to 31.5% 05/18/2021, also of note unable to measure LV threshold. Pateint agreeable to appointment with R. Charlcie Cradle on 05/26/20 at 10:55

## 2021-05-19 NOTE — Telephone Encounter (Signed)
Spoke with patient informed her that Tommye Standard has been a PA for a long time and has been working with patients with devices for over 5 years, patient thought this was going to be an invasive test informed her no this was just going to be like when the check her device in the office every year. Patient voiced understanding and will keep apt.

## 2021-05-19 NOTE — Telephone Encounter (Signed)
Patient calling in to see who our office going to fix the issues with her monitor. She feels she needs a Dr Lovena Le for that not a PA. Please advise

## 2021-05-19 NOTE — Progress Notes (Signed)
Chronic Care Management Pharmacy Assistant   Name: Ashley [name]  MRN: [id number] DOB: 1946-04-05  Reason for Encounter: CCM (Appointment Reminder)   Medications: Outpatient Encounter Medications as of 05/19/2021  Medication Sig   acetaminophen (TYLENOL) 500 MG tablet Take 500 mg by mouth daily as needed for moderate pain or headache (PAIN).    AMBULATORY NON FORMULARY MEDICATION Medication Name:  1 squatty potty to use as needed   Ascorbic Acid (VITAMIN C) 1000 MG tablet Take 1,000 mg by mouth See admin instructions. Take 1000 mg daily, may take an additional 1000 mg as needed for cold symptoms   aspirin EC 81 MG tablet Take 81 mg by mouth at bedtime.    beclomethasone (QVAR) 80 MCG/ACT inhaler Inhale 2 puffs into the lungs 2 (two) times daily.   benzonatate (TESSALON) 200 MG capsule Take 1 capsule (200 mg total) by mouth 3 (three) times daily as needed for cough.   Biotin 10 MG CAPS Take 1 tablet by mouth daily.   carvedilol (COREG) 6.25 MG tablet Take 0.5 tablets (3.125 mg total) by mouth 2 (two) times daily with a meal.   Cholecalciferol (VITAMIN D3) 50 MCG (2000 UT) capsule Take 1 capsule (2,000 Units total) by mouth daily.   Coenzyme Q10 (COQ-10) 100 MG CAPS Take 100 mg by mouth daily.   dextromethorphan (DELSYM) 30 MG/5ML liquid Take 15 mg by mouth daily as needed for cough.    ezetimibe (ZETIA) 10 MG tablet TAKE 1 TABLET BY MOUTH EVERY DAY   fluorouracil (EFUDEX) 5 % cream Apply topically.   GARLIC PO Take 1 capsule by mouth daily.   guaiFENesin (MUCINEX) 600 MG 12 hr tablet Take 600 mg by mouth 2 (two) times daily.   levalbuterol (XOPENEX HFA) 45 MCG/ACT inhaler Inhale 2 puffs into the lungs every 6 (six) hours as needed for wheezing or shortness of breath.   LORazepam (ATIVAN) 0.5 MG tablet Take 0.5-1 tablets (0.25-0.5 mg total) by mouth at bedtime as needed for anxiety or sleep.   magnesium 30 MG tablet Take 30 mg by mouth daily.   Menthol, Topical Analgesic, (BIOFREEZE  ROLL-ON EX) Apply topically.   miconazole (MICOTIN) 2 % powder Apply 1 application topically as needed (yeast).   nystatin cream (MYCOSTATIN) Apply 1 application topically as needed for dry skin (yeast infection).   Omega-3 Fatty Acids (FISH OIL) 1000 MG CAPS Take 1 capsule (1,000 mg total) by mouth daily.   OXYGEN Inhale into the lungs as needed.   pantoprazole (PROTONIX) 40 MG tablet Take 1 tablet (40 mg total) by mouth every Monday, Wednesday, and Friday.   potassium chloride (KLOR-CON) 10 MEQ tablet Take 1 tablet (10 mEq total) by mouth 3 (three) times a week.   predniSONE (DELTASONE) 20 MG tablet Take two tablets daily for 3 days followed by one tablet daily for 4 days   pyridOXINE (VITAMIN B-6) 100 MG tablet Take 100 mg by mouth daily.   Red Yeast Rice Extract (RED YEAST RICE PO) Take 1 capsule by mouth in the morning and at bedtime.   sacubitril-valsartan (ENTRESTO) 49-51 MG Take 1 tablet by mouth daily.   spironolactone (ALDACTONE) 25 MG tablet Take 1 tablet (25 mg total) by mouth daily.   torsemide (DEMADEX) 10 MG tablet Take 1 tablet (10 mg total) by mouth 3 (three) times a week.   triamcinolone (NASACORT) 55 MCG/ACT AERO nasal inhaler Place into the nose.   vitamin E 400 UNIT capsule Take 400 Units by mouth daily.   [  DISCONTINUED] bisoprolol (ZEBETA) 5 MG tablet Take 5 mg by mouth daily.   No facility-administered encounter medications on file as of 05/19/2021.   Ashley [name] was contacted to remind her of her upcoming telephone visit with [name] on 05/24/2021 at 2:00 pm. Patient was reminded to have all medications, supplements and any blood glucose and blood pressure readings available for review at appointment.  Are you having any problems with your medications? No  Do you have any concerns you like to discuss with the pharmacist? No  Star Rating Drugs: Medication:    Last Fill: Day Supply Sacubitril-Valsartan 49-51 mg Patient Assistance  [name], CPP  notified  Marijean Niemann, [place]  Time Spent: 10 Minutes

## 2021-05-24 ENCOUNTER — Ambulatory Visit (INDEPENDENT_AMBULATORY_CARE_PROVIDER_SITE_OTHER): Payer: Medicare Other

## 2021-05-24 ENCOUNTER — Other Ambulatory Visit: Payer: Self-pay

## 2021-05-24 ENCOUNTER — Telehealth: Payer: Self-pay

## 2021-05-24 DIAGNOSIS — E782 Mixed hyperlipidemia: Secondary | ICD-10-CM

## 2021-05-24 DIAGNOSIS — I5022 Chronic systolic (congestive) heart failure: Secondary | ICD-10-CM

## 2021-05-24 DIAGNOSIS — Z789 Other specified health status: Secondary | ICD-10-CM

## 2021-05-24 NOTE — Progress Notes (Signed)
Chronic Care Management Pharmacy Note  05/24/2021 Name:  Ashley [name] MRN:  [id number] DOB:  May 08, 1946  Summary: CCM follow up visit. Reviewed all medications/adherence. Barriers to adherence: she was declined for [facility] assistance despite approval in 2022. She plans to get samples from Dr. [name]. She will likely have to pay out of pocket some as samples are hard to come by. She estimates the cost will be $400/month. We discussed [place] as an option. Discussed chronic conditions - HLD, on Zetia and red yeast rice. Adequate control given statin intolerance, no ASCVD. CHF, followed by cardiology. No recent exacerbations.  Recommendations/Changes made from today's visit: None  Plan: Apply for [employer] for [place] CCM team follow up 1 month and as needed for assistance  Subjective: Ashley [name] is an 76 y.o. year old female who is a primary patient of [name], MD.  The CCM team was consulted for assistance with disease management and care coordination needs.    Engaged with patient by telephone for follow up visit in response to provider referral for pharmacy case management and/or care coordination services.   Consent to Services:  The patient was given information about Chronic Care Management services, agreed to services, and gave verbal consent prior to initiation of services.  Please see initial visit note for detailed documentation.    Patient Care Team: [name], MD as PCP - General Lovena Le Champ Mungo, MD as PCP - Electrophysiology (Cardiology) [name], MD as Referring Physician (Family Medicine) Lafayette Dragon, MD (Inactive) as Consulting Physician (Gastroenterology) Evans Lance, MD as Consulting Physician (Cardiology) Minna Merritts, MD as Consulting Physician (Cardiology) [name], [facility] as Pharmacist (Pharmacist)  Recent office visits: 04/15/21 - [name], MD - Pt presented for AWV.  Increase vitamin D3 to 2000 IU daily. LDL elevated, discussed lifestyle management. Hair loss, will update iron panel. Follow up 6 months.   Recent consult visits: 05/19/20 - Telephone note -  Pt denied for [facility] assistance program. 04/11/21 - Cardiology - Pt presented for 3 month follow up visit, CHF, cardiomyopathy, syncope. Continue current medications. EKG ordered.  Hospital visits: 03/08/21 - Cataract extraction surgery   Objective:  Lab Results  Component Value Date   CREATININE 0.91 04/01/2021   BUN 24 (H) 04/01/2021   GFR 61.63 04/01/2021   GFRNONAA >60 12/03/2020   GFRAA >60 09/10/2019   NA 139 04/01/2021   K 4.5 04/01/2021   CALCIUM 10.3 04/01/2021   CO2 24 04/01/2021   GLUCOSE 97 04/01/2021    Lab Results  Component Value Date/Time   HGBA1C 5.9 04/01/2021 08:46 AM   HGBA1C 5.9 12/01/2019 09:41 AM   GFR 61.63 04/01/2021 08:46 AM   GFR 63.36 02/07/2021 02:09 PM    Lab Results  Component Value Date   CHOL 194 04/01/2021   HDL 49.90 04/01/2021   LDLCALC 108 (H) 04/01/2021   LDLDIRECT 164.0 11/27/2018   TRIG 183.0 (H) 04/01/2021   CHOLHDL 4 04/01/2021    Hepatic Function Latest Ref Rng & Units 04/01/2021 02/07/2021 12/03/2020  Total Protein 6.0 - 8.3 g/dL 7.7 - 8.1  Albumin 3.5 - 5.2 g/dL 4.4 4.5 3.9  AST 0 - 37 U/L 25 - 37  ALT 0 - 35 U/L 24 - 41  Alk Phosphatase 39 - 117 U/L 95 - 84  Total Bilirubin 0.2 - 1.2 mg/dL 0.3 - 0.5  Bilirubin, Direct 0.0 - 0.2 mg/dL - - -    Lab Results  Component Value Date/Time  TSH 0.79 02/07/2021 02:09 PM   TSH 1.18 05/31/2018 11:51 AM    CBC Latest Ref Rng & Units 04/01/2021 02/07/2021 12/04/2020  WBC 4.0 - 10.5 K/uL 8.3 10.7(H) 13.7(H)  Hemoglobin 12.0 - 15.0 g/dL 12.8 12.8 14.4  Hematocrit 36.0 - 46.0 % 39.3 39.4 43.3  Platelets 150.0 - 400.0 K/uL 201.0 243.0 226    Lab Results  Component Value Date/Time   VD25OH 27.58 (L) 04/01/2021 08:46 AM   VD25OH 33.99 12/01/2019 09:41 AM   GAD 7 : Generalized Anxiety  Score 04/15/2021  Nervous, Anxious, on Edge 1  Control/stop worrying 1  Worry too much - different things 0  Trouble relaxing 1  Restless 0  Easily annoyed or irritable 1  Afraid - awful might happen 0  Total GAD 7 Score 4   Clinical ASCVD: No  The 10-year ASCVD risk score (Arnett DK, et al., 2019) is: 19.8%   Values used to calculate the score:     Age: 27 years     Sex: Female     Is Non-Hispanic African American: No     Diabetic: No     Tobacco smoker: No     Systolic Blood Pressure: 122 mmHg     Is BP treated: Yes     HDL Cholesterol: 49.9 mg/dL     Total Cholesterol: 194 mg/dL    Depression screen [facility] 2/9 04/15/2021 11/23/2020 01/12/2020  Decreased Interest 3 0 1  Down, Depressed, Hopeless 1 0 2  PHQ - 2 Score 4 0 3  Altered sleeping 3 0 3  Tired, decreased energy '3 3 3  ' Change in appetite 0 0 2  Feeling bad or failure about yourself  0 0 0  Trouble concentrating 1 0 1  Moving slowly or fidgety/restless 0 0 0  Suicidal thoughts 0 0 0  PHQ-9 Score '11 3 12  ' Difficult doing work/chores - Not difficult at all Somewhat difficult  Some recent data might be hidden    Social History   Tobacco Use  Smoking Status Never  Smokeless Tobacco Never  Tobacco Comments   Lived with smokers x 23 yrs, smoked herself "for a week"   BP Readings from Last 3 Encounters:  04/15/21 124/60  04/11/21 100/60  04/05/21 (!) 115/58   Pulse Readings from Last 3 Encounters:  04/15/21 82  04/11/21 74  04/05/21 95   Wt Readings from Last 3 Encounters:  04/15/21 184 lb (83.5 kg)  04/11/21 184 lb (83.5 kg)  04/05/21 180 lb (81.6 kg)   BMI Readings from Last 3 Encounters:  04/15/21 31.10 kg/m  04/11/21 30.62 kg/m  04/05/21 29.95 kg/m    Assessment/Interventions: Review of patient past medical history, allergies, medications, health status, including review of consultants reports, laboratory and other test data, was performed as part of comprehensive evaluation and provision of  chronic care management services.   SDOH:  (Social Determinants of Health) assessments and interventions performed: Yes SDOH Interventions    Flowsheet Row Most Recent Value  SDOH Interventions   Financial Strain Interventions Other (Comment)  [review additional options for assistance]      SDOH Screenings   Alcohol Screen: Not on file  Depression (PHQ2-9): Medium Risk   PHQ-2 Score: 11  Financial Resource Strain: Medium Risk   Difficulty of Paying Living Expenses: Somewhat hard  Food Insecurity: Not on file  Housing: Not on file  Physical Activity: Not on file  Social Connections: Not on file  Stress: Not on file  Tobacco Use:  Low Risk    Smoking Tobacco Use: Never   Smokeless Tobacco Use: Never   Passive Exposure: Not on file  Transportation Needs: Not on file    CCM Care Plan  Allergies  Allergen Reactions   Atorvastatin Other (See Comments)    Severe muscle cramps to generic atorvastatin.  Able to take brand lipitor.   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    Influenza Vac Split [Influenza Virus Vaccine] Other (See Comments)    "allergic to concentrated eggs that are in vaccine"   Latex Nausea Only, Rash and Other (See Comments)    Pain and infection   Morphine And Related Other (See Comments)    Internally feels like she is on fire    Pravastatin Other (See Comments)    myalgias   Prevacid [Lansoprazole] Other (See Comments)     Worsened GI side effects   Prevnar [Pneumococcal 13-Val Conj Vacc] Other (See Comments)    Egg allergy Bad reaction after shot   Rosuvastatin Other (See Comments)    Was not effective controlling lipids   Simvastatin Other (See Comments)    Leg cramps   Tape Other (See Comments) and Rash    All adhesives  - Blisters, itching and burning  blistering   Farxiga [Dapagliflozin]    Metaxalone    Tramadol     Leg cramps   Antihistamines, Chlorpheniramine-Type Other (See Comments)    Alters vision   Betadine [Povidone Iodine] Rash    Per pt.    Onion Diarrhea and Other (See Comments)    GI distress, flared IBS   Other Hives and Rash    NOTE: pt is able to take cephalosporins without reaction   Penicillins Other (See Comments)    CHILDHOOD ALLEGY/REACTION: "told 50 years ago I couldn't take it; might be immune to it", able to take amoxicillin Has patient had a PCN reaction causing immediate rash, facial/tongue/throat swelling, SOB or lightheadedness with hypotension: Unknown Has patient had a PCN reaction causing severe rash involving mucus membranes or skin necrosis: UnknowN Has patient had a PCN reaction that required hospitalization: /Unknown Has patient had a PCN reaction occurring within the last 10 years: Unknown If a   Red Dye Nausea Only and Swelling   Sertraline Other (See Comments)    Overly sedating   Sulfasalazine Other (See Comments)    "makes my whole body smell like sulfa & makes me sick just smelling it"    Medications Reviewed Today     Reviewed by [name], [facility] (Pharmacist) on 05/24/21 at 1427  Med List Status: <None>   Medication Order Taking? Sig Documenting Provider Last Dose Status Informant  acetaminophen (TYLENOL) 500 MG tablet [id number] Yes Take 500 mg by mouth daily as needed for moderate pain or headache (PAIN).  [provider] Taking Active Multiple Informants  Camillo Flaming MEDICATION [id number] Yes Medication Name:  1  squatty potty to use as needed Mauri Pole, MD Taking Active Multiple Informants  Ascorbic Acid (VITAMIN C) 1000 MG tablet [id number] Yes Take 1,000 mg by mouth See admin instructions. Take 1000 mg daily, may take an additional 1000 mg as needed for cold symptoms [provider] Taking Active Multiple Informants  aspirin EC 81 MG tablet [id number] Yes Take 81 mg by mouth at bedtime.  [provider] Taking Active Multiple Informants  beclomethasone (QVAR) 80 MCG/ACT inhaler [id number] Yes Inhale 2 puffs into the lungs 2 (two) times daily. Parrett, Fonnie Mu, NP Taking Active   benzonatate (TESSALON) 200 MG capsule [id number] Yes Take 1 capsule (200 mg total) by mouth 3 (three) times daily as needed for cough. [name], MD Taking Active   Biotin 10 MG CAPS [id number] Yes Take 1 tablet by mouth daily. [provider] Taking Active     Discontinued 07/24/11 1500 carvedilol (COREG) 6.25 MG tablet [id number] Yes Take 0.5 tablets (3.125 mg total) by mouth 2 (two) times daily with a meal. Theora Gianotti, NP Taking Active   Cholecalciferol (VITAMIN D3) 50 MCG (2000 UT) capsule [id number] Yes Take 1 capsule (2,000 Units total) by mouth daily. [name], MD Taking Active   Coenzyme Q10 (COQ-10) 100 MG CAPS [id number] Yes Take 100 mg by mouth daily. [provider] Taking Active Multiple Informants  dextromethorphan (DELSYM) 30 MG/5ML liquid [id number]  Take 15 mg by mouth daily as needed for cough.  [provider]  Active Multiple Informants  ezetimibe (ZETIA) 10 MG tablet [id number] Yes TAKE 1 TABLET BY MOUTH EVERY DAY Gollan, Kathlene November, MD Taking Active   fluorouracil (EFUDEX) 5 % cream [id number] Yes Apply topically. [provider] Taking Active   GARLIC PO [id number] Yes Take 1 capsule by mouth daily. [provider] Taking Active Multiple Informants           Med Note Luciana Axe, APRIL H   Thu Feb 06, 2019  9:23 AM)     guaiFENesin (MUCINEX) 600 MG 12 hr tablet [id number] Yes Take 600 mg by mouth 2 (two) times daily. [provider] Taking Active Multiple Informants  levalbuterol [facility] HFA) 45 MCG/ACT inhaler [id number] Yes Inhale 2 puffs into the lungs every 6 (six) hours as needed for wheezing or shortness of breath. Parrett, Fonnie Mu, NP Taking Active   LORazepam (ATIVAN) 0.5 MG tablet [id number] Yes Take 0.5-1 tablets (0.25-0.5 mg total) by mouth at bedtime as needed for anxiety or sleep. [name], MD Taking Active   magnesium 30 MG tablet [id number] Yes Take 30 mg by mouth daily. [provider] Taking Active Multiple Informants  Menthol, Topical Analgesic, (BIOFREEZE ROLL-ON [place]) [id number] Yes Apply topically. [provider] Taking Active Multiple Informants  miconazole (MICOTIN) 2 % powder [id number] Yes Apply 1 application topically as needed (yeast). [provider] Taking Active Multiple Informants  nystatin cream (MYCOSTATIN) [id number] Yes Apply 1 application topically as needed for dry skin (yeast infection). [name], MD Taking Active   Omega-3 Fatty Acids (FISH OIL) 1000 MG CAPS [id number] Yes Take 1 capsule (1,000 mg total) by mouth daily. Patrecia Pour, MD Taking Active   OXYGEN [id number] Yes Inhale into the lungs as needed. [provider] Taking Active   pantoprazole (PROTONIX) 40 MG tablet [id number] No Take 1 tablet (40 mg total) by mouth every Monday, Wednesday, and Friday.  Patient not taking: Reported on 05/24/2021   [name], MD Not Taking Active Multiple Informants  potassium chloride (KLOR-CON) 10 MEQ tablet [id number] Yes Take 1 tablet (10 mEq total) by mouth 3 (three) times a week.  Patient taking differently: Take 10 mEq by mouth 3 (three) times a week. Pt takes about once/week with torsemide.   Theora Gianotti, NP Taking Active   predniSONE (DELTASONE) 20 MG tablet [id number] No Take two tablets daily for 3 days  followed by one tablet daily for 4 days  Patient not taking: Reported on 05/24/2021   [name], MD Not Taking Active   pyridOXINE (VITAMIN B-6) 100 MG tablet [id number] Yes Take 100 mg by mouth daily. [provider] Taking Active Multiple Informants  Red Yeast Rice Extract (RED YEAST RICE PO) [id number] Yes Take 1 capsule by mouth in the morning and at bedtime. [provider] Taking Active            Med Note Luciana Axe, APRIL H   Thu Feb 06, 2019  9:24 AM)    sacubitril-valsartan (ENTRESTO) 49-51 MG [id number] Yes Take 1 tablet by mouth daily. Minna Merritts, MD Taking Active   spironolactone (ALDACTONE) 25 MG tablet [id number] Yes Take 1 tablet (25 mg total) by mouth daily. Theora Gianotti, NP Taking Active   torsemide (DEMADEX) 10 MG tablet [id number] Yes Take 1 tablet (10 mg total) by mouth 3 (three) times a week.  Patient taking differently: Take 10 mg by mouth 3 (three) times a week. Pt takes once weekly as of 05/24/21   Theora Gianotti, NP Taking Active   triamcinolone (NASACORT) 55 MCG/ACT AERO nasal inhaler [id number] Yes Place into the nose. [provider] Taking Active Multiple Informants  vitamin E 400 UNIT capsule [id number] Yes Take 400 Units by mouth daily. 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   Bleeding external hemorrhoids 10/31/2016   LBBB (left bundle branch block) 07/11/2016   Gastritis 06/12/2016   Fecal incontinence    Chronic generalized abdominal pain 11/22/2015   Abdominal aortic atherosclerosis ([place]) 11/13/2015   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   Sore throat 07/12/2015   Hoarseness 06/21/2015   Hypersensitivity to pneumococcal vaccine 06/10/2015   Medicare annual wellness visit, subsequent 06/08/2015   Advanced care planning/counseling discussion 06/08/2015   Intertrigo 04/06/2015   Nonintractable episodic headache 04/06/2015   Peripheral neuropathy 04/06/2015   S/P colostomy takedown 02/10/2014   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 10/23/2013   H/O multiple allergies 10/10/2013   History of diverticular abscess 09/25/2013   Osteoporosis 01/13/2013   Irregular heart beat 08/06/2012   MDD (major depressive disorder), recurrent episode, moderate ([place]) 07/09/2012   Chronic recurrent sinusitis 06/17/2012   Vitamin D deficiency 03/05/2012   Essential tremor 12/29/2011   GERD (gastroesophageal reflux disease) 11/13/2011   Allergic rhinitis 11/13/2011   CKD (chronic kidney disease) stage 3, GFR 30-59 ml/min (HCC) 11/09/2011  Chronic respiratory failure with hypoxia ([place]) 07/31/2011   Cardiomyopathy, dilated, 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   Mitral regurgitation 82/80/0349   Chronic systolic CHF (congestive heart failure) ([place]) 02/24/2011   Chronic bronchitis ([place]) 01/20/2011   Syncope 11/02/2010   Chronic fatigue and malaise 11/02/2010   Anxiety state 08/18/2010   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   Irritable bowel syndrome 11/22/2006   FIBROCYSTIC BREAST DISEASE 11/22/2006   Osteoarthritis 11/22/2006   Fibromyalgia 11/22/2006    Immunization History  Administered Date(s) Administered   Pneumococcal Conjugate-13 06/08/2015   Pneumococcal Polysaccharide-23 02/16/2012   Td 11/12/2005    Conditions to be  addressed/monitored:  Hyperlipidemia and Heart Failure  Care Plan : [place]  Updates made by [name], [place] since 05/24/2021 12:00 AM     Problem: CHL AMB "PATIENT-SPECIFIC PROBLEM"      Long-Range Goal: Disease Management   Start Date: 05/24/2021  Priority: High  Note:   Current Barriers:  Cost - Entresto  Pharmacist Clinical Goal(s):  Patient will verbalize ability to afford treatment regimen through collaboration with PharmD and provider.   Interventions: 1:1 collaboration with [name], MD regarding development and update of comprehensive plan of care as evidenced by provider attestation and co-signature Inter-disciplinary care team collaboration (see longitudinal plan of care) Comprehensive medication review performed; medication list updated in electronic medical record  Hyperlipidemia: (LDL goal < 100) -Not ideally controlled, LDL 108 -Adherence reviewed, no gaps in Zetia adherence -Current treatment: Red yeast rice - 1 capsule BID Ezetimibe 10 mg - 1 tablet daily  CoQ10 100 mg - 1 tablet daily  Aspirin 81 mg - 1 tablet daily -Medications previously tried: multiple statins  -Current exercise habits: she tries to stay active - she plays card games 3-4 days per week, taking water color class in her community, goes out for breakfast each morning, cleans home and goes grocery shopping, gardens in [employer] -She does not watch diet for cholesterol content in foods. She is very sensitive to medications.  -Educated on Cholesterol goals;  -Recommended to continue current medication  Heart Failure (Goal: manage symptoms and prevent exacerbations) -Not ideally controlled, per patient report, she had chronic fatigue -Last ejection fraction: 30% (Date: 12/2020) -HF type: Systolic -NYHA Class: III (marked limitation of activity) -AHA HF Stage: C (Heart disease and symptoms present) -Current treatment: Spironolactone 25 mg - 1 tablet  daily Carvedilol 6.25 mg - 1/2 tablet BID Entresto 49-51 mg - 1 tablet daily Torsemide 10 mg - 1 tablet MWF (she takes once weekly) Potassium chloride 10 mEq - 1 tablet with torsemide (she takes once weekly) -Medications previously tried: [place] - unable to tolerate  -Current home BP/HR readings: no history of elevated BP, does not monitor -She does not weigh every day but she watches for swelling/symptoms of fluid-overload. She watches for weight gain of > 3 lbs. She limits fluid intake, drinks 2-3, 8 ounce glasses of water/day. She wears oxygen 1-2x week at night. -Educated on Importance of weighing daily; if you gain more than 3 pounds in one day or 5 pounds in one week, call cardiologist -Recommended to continue current medication; Will contact cardiology for [facility] prescription. Check on [employer].  Patient Goals/Self-Care Activities Patient will:  - take medications as prescribed as evidenced by patient report and record review  Follow Up Plan: Telephone follow up appointment with care management team member scheduled for: 12 months (CCM visit); 6 months (Adherence review); 1 month (cost assistance)  Medication Assistance:  [name] cost concern. Not eligible for assistance program. Will apply for [place] if pt meets requirements.   Compliance/Adherence/Medication fill history: Care Gaps: None  Star-Rating Drugs: Medication:                                        Last Fill:         Day Supply Sacubitril-Valsartan 49-51 mg            Patient Assistance 2022  Other: Carvedilol 6.25 mg    05/06/21   90 DS Ezetimibe 10 mg    05/03/21  90 DS Spironolactone 25 mg   03/10/21  90 DS  Patient's preferred pharmacy is:  [id number] [place]Phone: [phone]: [phone] [facility] by MDorene Grebe [address]Phone: [phone]: [phone] Uses pill box? Yes Pt endorses 100% compliance [facility].   Care Plan and Follow Up Patient Decision:  Patient agrees to Care Plan and Follow-up.  MDebbora Dus PharmD Clinical Pharmacist Practitioner [phone]

## 2021-05-24 NOTE — Telephone Encounter (Signed)
Spoke with patient today regarding option of Set designer for [employer] for Cardiomyopathy [employer].  Items needed for approval include: proof of income less than 500% FPL and verification of diagnosis. Items should be faxed to [phone].  [name], PharmD Clinical Pharmacist Practitioner [place] [facility] [phone]

## 2021-05-24 NOTE — Patient Instructions (Addendum)
Dear Ashley [name],  Below is a summary of the goals we discussed during our follow up appointment on May 24, 2021. Please contact me anytime with questions or concerns.   Visit Information  Patient Care Plan: CCM Pharmacy Care Plan     Problem Identified: CHL AMB "PATIENT-SPECIFIC PROBLEM"      Long-Range Goal: Disease Management   Start Date: 05/24/2021  Priority: High  Note:   Current Barriers:  Cost - Entresto  Pharmacist Clinical Goal(s):  Patient will verbalize ability to afford treatment regimen through collaboration with PharmD and provider.   Interventions: 1:1 collaboration with Ashley Bush, MD regarding development and update of comprehensive plan of care as evidenced by provider attestation and co-signature Inter-disciplinary care team collaboration (see longitudinal plan of care) Comprehensive medication review performed; medication list updated in electronic medical record  Hyperlipidemia: (LDL goal < 100) -Not ideally controlled, LDL 108 -Adherence reviewed, no gaps in Zetia adherence -Current treatment: Red yeast rice - 1 capsule BID Ezetimibe 10 mg - 1 tablet daily  CoQ10 100 mg - 1 tablet daily  Aspirin 81 mg - 1 tablet daily -Medications previously tried: multiple statins  -Current exercise habits: she tries to stay active - she plays card games 3-4 days per week, taking water color class in her community, goes out for breakfast each morning, cleans home and goes grocery shopping, gardens in [employer] -She does not watch diet for cholesterol content in foods. She is very sensitive to medications.  -Educated on Cholesterol goals;  -Recommended to continue current medication  Heart Failure (Goal: manage symptoms and prevent exacerbations) -Not ideally controlled, per patient report, she had chronic fatigue -Last ejection fraction: 30% (Date: 12/2020) -HF type: Systolic -NYHA Class: III (marked limitation of activity) -AHA HF Stage: C  (Heart disease and symptoms present) -Current treatment: Spironolactone 25 mg - 1 tablet daily Carvedilol 6.25 mg - 1/2 tablet BID Entresto 49-51 mg - 1 tablet daily Torsemide 10 mg - 1 tablet MWF (she takes once weekly) Potassium chloride 10 mEq - 1 tablet with torsemide (she takes once weekly) -Medications previously tried: [place] - unable to tolerate  -Current home BP/HR readings: no history of elevated BP, does not monitor -She does not weigh every day but she watches for swelling/symptoms of fluid-overload. She watches for weight gain of > 3 lbs. She limits fluid intake, drinks 2-3, 8 ounce glasses of water/day. She wears oxygen 1-2x week at night. -Educated on Importance of weighing daily; if you gain more than 3 pounds in one day or 5 pounds in one week, call cardiologist -Recommended to continue current medication; Will contact cardiology for [facility] prescription. Check on [employer].  Patient Goals/Self-Care Activities Patient will:  - take medications as prescribed as evidenced by patient report and record review  Follow Up Plan: Telephone follow up appointment with care management team member scheduled for: 12 months (CCM visit); 6 months (Adherence review); 1 month (cost assistance)      Patient verbalizes understanding of instructions provided today and agrees to view in [place].   [name], PharmD Clinical Pharmacist Practitioner [facility] [phone]

## 2021-05-24 NOTE — Progress Notes (Signed)
Cardiology Office Note Date:  05/24/2021  Patient ID:  Elloise, [name] 01-Jul-1945, MRN [id number] PCP:  Ashley Bush, MD  Cardiologist:  Dr. Rockey [name] AHF: Dr. Haroldine [name] Electrophysiologist: Dr. Lovena [name]    Chief Complaint: decrease in effective CRT pacing  History of Present Illness: Ashley [name] is a 76 y.o. female with history of HTN, COPD, GERD, fibromyalgia, diverticulitis s/p partil resection, MGUS, morbid obesity, LBBB, NICM, chronic CHF (systolic), ICD.  She come sin today to be seen for Dr. Lovena [name], last seen by him 02/06/2019, noted that no CS lead was placed with unacceptable veins, and put a HIS pacing lead, unfortunately without any preceived clinical benefir or improvement by the patient, despite narroing of her QRS some from 127ms > 174ms Recommended AV optimization echo (which was completed)  She saw Dr. Haroldine [name] and  02/10/21, doing fairly well, on good medical therapy, not yet felt to need CPX testing  Was off Home O2, occasional postural dizziness , at that time OptiVol looked good reported 100%VP, intolerant to [place]  More recently saw [name] 04/11/21, getting over a bronchitis, weight was down, , eating out frequently, not doing cardiac rehab  LVEF: 01/07/21: 30% 04/25/20: 20-25% 09/04/19: 30-35% 02/26/19: AV Opt echo, 30-35% 10/15/18: 15-20% 12/30/17: 60-65% 08/07/16: 40-45% 02/04/16: 35-40%  Device clinic alert for reduced effective BI Ve paing % and unable to run LV (HIS) lead threshold Oct 2022 99.5% Jan 4, 23 31.5%   TODAY She reports that she has not felt well in YEARS, has discussed with every doctor she sees and noone really knows why.  She gets what she needs to do done, and is exhausted at the end of the day Has no energy to spare at any moment I"I always feel horrible" No CP, no palpitations + SOB/DOE no change in years and no acute changes of late She has noted though that at the end of the day after doing her daily chores, her  forearms/inside of her elbows ache, this is improved with BioFreeze and massage, but is new. No near syncope or syncope   Device information MDT single chamber ICD implanted 07/14/11 > upgrade to CRT device though with addition of an RA and HIS lead 11/04/2018   Past Medical History:  Diagnosis Date   Abdominal aortic atherosclerosis ([place]) 11/2015   by CT   Allergy    Anemia    Anxiety    Automatic implantable cardioverter-defibrillator in [name]    a. 2012 s/p MDT single lead ICD; b. 10/2018 BiV upgrade-->MDT ser # [id number].   Bronchiectasis    a. possibly mild, treat URIs aggressively   Cancer ([place])    basal cell, arms & neck   Chronic sinusitis    COPD (chronic obstructive pulmonary disease) ([place])    COVID-19 12/06/2020   Depression    Dyspnea    Essential hypertension    Pt denies   Family history of adverse reaction to anesthesia    Mother - PONV   Fibrocystic breast disease    Fibromyalgia    GERD (gastroesophageal reflux disease)    H/O multiple allergies 10/10/2013   Hemorrhoids    HFrEF (heart failure with reduced ejection fraction) ([place])    a. 06/2011 cMRI: EF 34%, no LGE; b. 01/2016 Echo: EF 35-40%; c. 07/2016 Echo: EF 40-45%; d. 02/2020 Echo: EF 20-25%, glob HK. GrI DD. Mildly red RV fxn. Mildly dil LA. Triv MR.   History of diverticulitis of colon    History of pneumonia 2012  Insomnia    Irritable bowel syndrome    Laryngeal nodule    Dr. Thomasena [name]   Migraine without aura, without mention of intractable migraine without mention of status migrainosus    Mitral regurgitation    Neuropathy    feet and hands   Nonischemic cardiomyopathy ([place])    a. 2012 s/p single lead ICD; b. 06/2011 cMRI: EF 34%, no LGE; b. 01/2016 Echo: EF 35-40%; c. 07/2016 Echo: EF 40-45%; d. 10/2018 Cath: nl cors. EF 20-25%; e. 10/2018 s/p BiV MDT ICD upgrade; f. 02/2020 Echo: EF 20-25%, glob HK. GrI DD.   Osteoarthritis    knees, back, hands, low back,    Osteoporosis 01/2013   T-2.7 spine  (01/2016)   PONV (postoperative nausea and vomiting)    sigmoid diverticulitis with perforation, abscess and fistula 09/30/2013   Sleep apnea 2010   borderline, inconclusive- /w Zinc     Past Surgical History:  Procedure Laterality Date   ABDOMINAL HYSTERECTOMY  1987   w/BSO   ANAL RECTAL MANOMETRY N/A 02/02/2016   Procedure: ANO RECTAL MANOMETRY;  Surgeon: Ashley Pole, MD;  Location: [facility];  Service: Endoscopy;  Laterality: N/A;   APPENDECTOMY     AUGMENTATION MAMMAPLASTY     BIV UPGRADE N/A 11/04/2018   Procedure: BIV ICD  UPGRADE;  Surgeon: Ashley Lance, MD;  Location: [facility];  Service: Cardiovascular;  Laterality: N/A;   breast cystectomies     due to FCBD   BREAST MASS EXCISION  01/2010   Left-fibrocystic change w/intraductal papilloma, no malignancy   carotid US  80/9983   3-82% LICA, normal RICA   CATARACT EXTRACTION W/PHACO Right 03/08/2021   Procedure: CATARACT EXTRACTION PHACO AND INTRAOCULAR LENS PLACEMENT ([place]) RIGHT;  Surgeon: Ashley Robson, MD;  Location: [facility];  Service: Ophthalmology;  Laterality: Right;  13.01 1:15.7   [place] Bunionectomy  11/04/08   Right Great Toe (Dr. Beola [name])   COLONOSCOPY  01/2018   1 small benign polyp, diverticulosis, healthy anastomosis Ashley [name] @ [facility])   COLOSTOMY N/A 10/02/2013   DESCENDING COLOSTOMY;  Surgeon: Ashley Burn. [place], MD   COLOSTOMY REVERSAL N/A 02/10/2014   Ashley [place], MD   EMG/MCV  10/16/01   + mild carpal tunnel   ESOPHAGOGASTRODUODENOSCOPY  09/2014   small HH, irregular Z line, fundic gland polyps with mild gastritis Ashley [name])   Flex laryngoscopy  06/11/06   (Ashley [name]) nml   IMPLANTABLE CARDIOVERTER DEFIBRILLATOR IMPLANT N/A 07/14/2011   MDT single chamber ICD   KNEE ARTHROSCOPY W/ ORIF     Left; "meniscus tear"   LASIK     left eye   MANDIBLE SURGERY     "got about 6 pins in the bottom of my jaw"   NCS  11/2014   L ulnar neuropathy, mild B median nerve entrapment (Ashley [name])    OSTEOTOMY     Left foot   PARTIAL COLECTOMY N/A 10/02/2013   PARTIAL COLECTOMY;  Surgeon: Ashley Burn. [place]ue[place], MD   RIGHT/LEFT HEART CATH AND CORONARY ANGIOGRAPHY N/A 10/16/2018   Procedure: RIGHT/LEFT HEART CATH AND CORONARY ANGIOGRAPHY;  Surgeon: [place] Hampshire, MD;  Location: [facility];  Service: Cardiovascular;  Laterality: N/A;   sinus surgery     x3 with balloon    TOE AMPUTATION     "toe beside baby toe on left foot; got gangrene from corn"   TONSILLECTOMY AND ADENOIDECTOMY      Current Outpatient Medications  Medication Sig Dispense Refill  acetaminophen (TYLENOL) 500 MG tablet Take 500 mg by mouth daily as needed for moderate pain or headache (PAIN).      AMBULATORY NON FORMULARY MEDICATION Medication Name:  1 squatty potty to use as needed 1 each 0   Ascorbic Acid (VITAMIN C) 1000 MG tablet Take 1,000 mg by mouth See admin instructions. Take 1000 mg daily, may take an additional 1000 mg as needed for cold symptoms     aspirin EC 81 MG tablet Take 81 mg by mouth at bedtime.      beclomethasone (QVAR) 80 MCG/ACT inhaler Inhale 2 puffs into the lungs 2 (two) times daily. 1 each 5   benzonatate (TESSALON) 200 MG capsule Take 1 capsule (200 mg total) by mouth 3 (three) times daily as needed for cough. 30 capsule 6   Biotin 10 MG CAPS Take 1 tablet by mouth daily.     carvedilol (COREG) 6.25 MG tablet Take 0.5 tablets (3.125 mg total) by mouth 2 (two) times daily with a meal. 90 tablet 1   Cholecalciferol (VITAMIN D3) 50 MCG (2000 UT) capsule Take 1 capsule (2,000 Units total) by mouth daily.     Coenzyme Q10 (COQ-10) 100 MG CAPS Take 100 mg by mouth daily.     dextromethorphan (DELSYM) 30 MG/5ML liquid Take 15 mg by mouth daily as needed for cough.      ezetimibe (ZETIA) 10 MG tablet TAKE 1 TABLET BY MOUTH EVERY DAY 90 tablet 3   fluorouracil (EFUDEX) 5 % cream Apply topically.     GARLIC PO Take 1 capsule by mouth daily.     guaiFENesin (MUCINEX) 600 MG 12 hr tablet Take  600 mg by mouth 2 (two) times daily.     levalbuterol (XOPENEX HFA) 45 MCG/ACT inhaler Inhale 2 puffs into the lungs every 6 (six) hours as needed for wheezing or shortness of breath. 15 g 3   LORazepam (ATIVAN) 0.5 MG tablet Take 0.5-1 tablets (0.25-0.5 mg total) by mouth at bedtime as needed for anxiety or sleep. 30 tablet 1   magnesium 30 MG tablet Take 30 mg by mouth daily.     Menthol, Topical Analgesic, (BIOFREEZE ROLL-ON EX) Apply topically.     miconazole (MICOTIN) 2 % powder Apply 1 application topically as needed (yeast).     nystatin cream (MYCOSTATIN) Apply 1 application topically as needed for dry skin (yeast infection). 30 g 3   Omega-3 Fatty Acids (FISH OIL) 1000 MG CAPS Take 1 capsule (1,000 mg total) by mouth daily.  0   OXYGEN Inhale into the lungs as needed.     pantoprazole (PROTONIX) 40 MG tablet Take 1 tablet (40 mg total) by mouth every Monday, Wednesday, and Friday. (Patient not taking: Reported on 05/24/2021)     potassium chloride (KLOR-CON) 10 MEQ tablet Take 1 tablet (10 mEq total) by mouth 3 (three) times a week. (Patient taking differently: Take 10 mEq by mouth 3 (three) times a week. Pt takes about once/week with torsemide.) 40 tablet 1   pyridOXINE (VITAMIN B-6) 100 MG tablet Take 100 mg by mouth daily.     Red Yeast Rice Extract (RED YEAST RICE PO) Take 1 capsule by mouth in the morning and at bedtime.     sacubitril-valsartan (ENTRESTO) 49-51 MG Take 1 tablet by mouth daily. 90 tablet 3   spironolactone (ALDACTONE) 25 MG tablet Take 1 tablet (25 mg total) by mouth daily. 90 tablet 1   torsemide (DEMADEX) 10 MG tablet Take 1 tablet (10 mg total)  by mouth 3 (three) times a week. (Patient taking differently: Take 10 mg by mouth 3 (three) times a week. Pt takes once weekly as of 05/24/21) 40 tablet 1   triamcinolone (NASACORT) 55 MCG/ACT AERO nasal inhaler Place into the nose.     vitamin E 400 UNIT capsule Take 400 Units by mouth daily.     No current  facility-administered medications for this visit.    Allergies:   Atorvastatin; Chlorhexidine; Codeine; Cyclobenzaprine; Cymbalta [duloxetine hcl]; Diflucan [fluconazole]; Dilaudid [hydromorphone hcl]; Furosemide; Influenza vac split [influenza virus vaccine]; Latex; Morphine and related; Pravastatin; Prevacid [lansoprazole]; Prevnar [pneumococcal 13-val conj vacc]; Rosuvastatin; Simvastatin; Tape; Farxiga [dapagliflozin]; Metaxalone; Tramadol; Antihistamines, chlorpheniramine-type; Betadine [povidone iodine]; Onion; Other; Penicillins; Red dye; Sertraline; and Sulfasalazine   Social History:  The patient  reports that she has never smoked. She has never used smokeless tobacco. She reports that she does not drink alcohol and does not use drugs.   Family History:  The patient's family history includes Alcohol abuse in her paternal grandfather; Arthritis in her mother; Colon polyps in her father; Dementia in her mother; Emphysema in her mother; Heart disease in her paternal grandfather and paternal grandmother; Hypertension in her maternal grandfather; Lung cancer in her father; Osteoporosis in her mother; Stroke in her mother and paternal grandfather.  ROS:  Please see the history of present illness.    All other systems are reviewed and otherwise negative.   PHYSICAL EXAM:  VS:  There were no vitals taken for this visit. BMI: There is no height or weight on file to calculate BMI. Well nourished, well developed, in no acute distress HEENT: normocephalic, atraumatic Neck: no JVD, carotid bruits or masses Cardiac:  RRR; no significant murmurs, no rubs, or gallops Lungs:  CTA b/l, no wheezing, rhonchi or rales Abd: soft, nontender, significant hernia MS: no deformity or atrophy Ext: trace if any edema Skin: warm and dry, no rash Neuro:  No gross deficits appreciated Psych: euthymic mood, full affect  ICD site is stable, no tethering or discomfort   EKG:  Done today and reviewed by myself  shows  Baseline: SR/V paced, QRS 155ms (no HIS pacing with interrogation outputs noted subthreshold)   Device interrogation done today and reviewed by myself:  Battery, RA/RV lead measurements are good HIS lead threshold is up (chronic lead from 2020) Impedance and sensing stable No arrhythmias Progamming/optimization discussed below  826/22: TTE IMPRESSIONS   1. Left ventricular ejection fraction, by estimation, is 30 %. The left  ventricle has moderately decreased function. The left ventricle  demonstrates regional wall motion abnormalities (see scoring  diagram/findings for description). Left ventricular  diastolic parameters are consistent with Grade I diastolic dysfunction  (impaired relaxation).   2. Right ventricular systolic function is normal. The right ventricular  size is normal. There is normal pulmonary artery systolic pressure  85/63/14: TTE IMPRESSIONS   1. Left ventricular ejection fraction, by estimation, is 20 to 25%. The  left ventricle has severely decreased function. The left ventricle  demonstrates global hypokinesis. There is mild left ventricular  hypertrophy. Left ventricular diastolic parameters   are consistent with Grade I diastolic dysfunction (impaired relaxation).   2. Right ventricular systolic function is mildly reduced. The right  ventricular size is normal. There is normal pulmonary artery systolic  pressure.   3. Left atrial size was mildly dilated.   4. The mitral valve is degenerative. Trivial mitral valve regurgitation.  Moderate mitral annular calcification.   5. The aortic valve is tricuspid.  Aortic valve regurgitation is not  visualized. No aortic stenosis is present.   6. The inferior vena cava is normal in size with greater than 50%  respiratory variability, suggesting right atrial pressure of 3 mmHg.   09/04/19: LVEF 30-35%   02/26/2019: TTE (AV opt) IMPRESSIONS   1. Left ventricular ejection fraction, by visual estimation, is  30 to  35%. The left ventricle has severely decreased function. There is no left  ventricular hypertrophy.   2. Severe global hypokinesis.   3. Elevated left atrial and left ventricular end-diastolic pressures.   4. Left ventricular diastolic Doppler parameters are consistent with  impaired relaxation pattern of LV diastolic filling.   5. Global right ventricle has normal systolic function.The right  ventricular size is normal. No increase in right ventricular wall  thickness.   6. The mitral valve is abnormal. Moderate mitral valve regurgitation.   7. A pacer wire is visualized.   8. This study was used by the cardiac electrophysiologist to optimize AV  pacer settings.   9. Left atrial size was mildly dilated.  10. The interatrial septum was not assessed.  11. Right atrial size was normal.  12. The aortic valve is tricuspid Aortic valve regurgitation was not  visualized by color flow Doppler. Mild aortic valve sclerosis without  stenosis.  13. The tricuspid valve is grossly normal. Tricuspid valve regurgitation  is mild.  14. The pulmonic valve was grossly normal. Pulmonic valve regurgitation is  not visualized by color flow Doppler.  15. The inferior vena cava is normal in size with greater than 50%  respiratory variability, suggesting right atrial pressure of 3 mmHg.   In comparison to the previous echocardiogram(s): Prior study on 10/14/2018,  LVEF 15-20%.   RHC/LHC 10/16/2018  LVEF 20-25. Normal coronaries RA 8 PCWP 21 CO 4.45 CI 2.35    ECHO 10/14/2018 EF 15-20%. RV normal, Severe central mitral valve regurgitation.     1. The left ventricle has a 2D calculated ejection fraction 15-20%%. The cavity size was mildly dilated. Left ventricular diastolic Doppler parameters are indeterminate.  2. The right ventricle has normal systolic function. The cavity was normal. There is no increase in right ventricular wall thickness.  3. Left atrial size was moderately dilated.  4. The  mitral valve is rheumatic. Moderate thickening of the mitral valve leaflet. There is mild to moderate mitral annular calcification present. Mitral valve regurgitation is severe by color flow Doppler. The MR jet is centrally-directed.   Echo 8/22: EF 30%, RV normal   Recent Labs: 12/02/2020: B Natriuretic Peptide 15.1 12/03/2020: Magnesium 2.1 02/07/2021: Pro B Natriuretic peptide (BNP) 21.0; TSH 0.79 04/01/2021: ALT 24; BUN 24; Creatinine, Ser 0.91; Hemoglobin 12.8; Platelets 201.0; Potassium 4.5; Sodium 139  04/01/2021: Cholesterol 194; HDL 49.90; LDL Cholesterol 108; Total CHOL/HDL Ratio 4; Triglycerides 183.0; VLDL 36.6   CrCl cannot be calculated (Patient's most recent lab result is older than the maximum 21 days allowed.).   Wt Readings from Last 3 Encounters:  04/15/21 184 lb (83.5 kg)  04/11/21 184 lb (83.5 kg)  04/05/21 180 lb (81.6 kg)     Other studies reviewed: Additional studies/records reviewed today include: summarized above  ASSESSMENT AND PLAN:  CRT-D RA, RV, HIS lead system Has had AV echo optimization At time of implant noted:  evidence of Right bundle capture along with septal capture. The QRS was 120  TODAY Device optimization with industry assist HIS lead threshold up  3V/0.85ms (was programmed 2.5V/0.4) Best threshold industry got  was unipolar, 1.5/0.90ms > programmed 2.5/0.8 Best QRS was prior to device interrogation 155ms, this was RV pacing only (finding the HIS lead was subthreshold) Frustratingly we could not repeat this even with HIS lead off and RV pacing alone again was 175ms HIS alone 181ms HIS 1st by 72ms (as initially programmed) 172ms HIS 1st by 65ms 141ms (FINAL programming) HIS 1st by 56ms 154ms Simultaneous 167ms  With HIS output decrements there was NO appreciation from high outputs to total LOC any kind of QRS duration change ALSO noted regardless pacing schemes MORPHOLOGY also UNCHANGED Older EKGs morphology also the same but QRS durations  historically have been much better 120's  OptiVol remains well below threshold, though has a trend up Exam does not note volume OL  No acute change in any of her symptoms of late  NICM Chronic CHF (systolic) Chronic symptoms, unchanged Does not appear volume OL Discussed she no longer qualifies for Entresto financial help, asked to send refills to her local pharmacy, says her GI pharmacist was going to help her try and get some assistance, I advised her to reach out to he HF team as well.   Disposition: F/u with me in a couple weeks, revisit effective pacing %, volume, perhaps attempt further optimization.  Current medicines are reviewed at length with the patient today.  The patient did not have any concerns regarding medicines.  Venetia Night, PA-C 05/24/2021 7:31 PM     [address] [place] [id number] [phone] (office)  [phone] (fax)

## 2021-05-26 ENCOUNTER — Other Ambulatory Visit: Payer: Self-pay

## 2021-05-26 ENCOUNTER — Encounter: Payer: Self-pay | Admitting: Physician Assistant

## 2021-05-26 ENCOUNTER — Ambulatory Visit (INDEPENDENT_AMBULATORY_CARE_PROVIDER_SITE_OTHER): Payer: Medicare Other | Admitting: Physician Assistant

## 2021-05-26 VITALS — BP 108/66 | HR 79 | Ht 65.0 in | Wt 185.8 lb

## 2021-05-26 DIAGNOSIS — I5022 Chronic systolic (congestive) heart failure: Secondary | ICD-10-CM

## 2021-05-26 DIAGNOSIS — Z95810 Presence of automatic (implantable) cardiac defibrillator: Secondary | ICD-10-CM | POA: Diagnosis not present

## 2021-05-26 DIAGNOSIS — I428 Other cardiomyopathies: Secondary | ICD-10-CM

## 2021-05-26 LAB — CUP PACEART INCLINIC DEVICE CHECK
Battery Remaining Longevity: 43 mo
Battery Voltage: 2.97 V
Brady Statistic AP VP Percent: 0.02 %
Brady Statistic AP VS Percent: 0.03 %
Brady Statistic AS VP Percent: 99.34 %
Brady Statistic AS VS Percent: 0.61 %
Brady Statistic RA Percent Paced: 0.05 %
Brady Statistic RV Percent Paced: 99.11 %
Date Time Interrogation Session: 20230112192500
HighPow Impedance: 80 Ohm
Implantable Lead Implant Date: 20130301
Implantable Lead Implant Date: 20200622
Implantable Lead Implant Date: 20200622
Implantable Lead Location: 753859
Implantable Lead Location: 753860
Implantable Lead Location: 753860
Implantable Lead Model: 3830
Implantable Lead Model: 5076
Implantable Lead Model: 6935
Implantable Pulse Generator Implant Date: 20200622
Lead Channel Impedance Value: 304 Ohm
Lead Channel Impedance Value: 342 Ohm
Lead Channel Impedance Value: 380 Ohm
Lead Channel Impedance Value: 399 Ohm
Lead Channel Impedance Value: 399 Ohm
Lead Channel Impedance Value: 570 Ohm
Lead Channel Pacing Threshold Amplitude: 0.375 V
Lead Channel Pacing Threshold Amplitude: 0.625 V
Lead Channel Pacing Threshold Amplitude: 1.375 V
Lead Channel Pacing Threshold Pulse Width: 0.4 ms
Lead Channel Pacing Threshold Pulse Width: 0.4 ms
Lead Channel Pacing Threshold Pulse Width: 0.4 ms
Lead Channel Sensing Intrinsic Amplitude: 10 mV
Lead Channel Sensing Intrinsic Amplitude: 2.25 mV
Lead Channel Sensing Intrinsic Amplitude: 2.375 mV
Lead Channel Sensing Intrinsic Amplitude: 6.125 mV
Lead Channel Setting Pacing Amplitude: 1.5 V
Lead Channel Setting Pacing Amplitude: 2 V
Lead Channel Setting Pacing Amplitude: 2.5 V
Lead Channel Setting Pacing Pulse Width: 0.4 ms
Lead Channel Setting Pacing Pulse Width: 0.8 ms
Lead Channel Setting Sensing Sensitivity: 0.3 mV

## 2021-05-26 MED ORDER — ENTRESTO 49-51 MG PO TABS: 1.0000 | ORAL_TABLET | Freq: Every day | ORAL | 6 refills | Status: DC

## 2021-05-26 NOTE — Patient Instructions (Signed)
°  Medication Instructions:   Your physician recommends that you continue on your current medications as directed. Please refer to the Current Medication list given to you today.   *If you need a refill on your cardiac medications before your next appointment, please call your pharmacy*   Lab Work: [place]   If you have labs (blood work) drawn today and your tests are completely normal, you will receive your results only by: [place] (if you have MyChart) OR A paper copy in the mail If you have any lab test that is abnormal or we need to change your treatment, we will call you to review the results.   Testing/Procedures: NONE ORDERED  TODAY     Follow-Up: At [facility], you and your health needs are our priority.  As part of our continuing mission to provide you with exceptional heart care, we have created designated Provider Care Teams.  These Care Teams include your primary Cardiologist (physician) and Advanced Practice Providers (APPs -  Physician Assistants and Nurse Practitioners) who all work together to provide you with the care you need, when you need it.  We recommend signing up for the patient portal called "MyChart".  Sign up information is provided on this After Visit Summary.  MyChart is used to connect with patients for Virtual Visits (Telemedicine).  Patients are able to view lab/test results, encounter notes, upcoming appointments, etc.  Non-urgent messages can be sent to your provider as well.   To learn more about what you can do with MyChart, go to [email or web address].    Your next appointment:   2 week(s)  The format for your next appointment:   In Person  Provider:   Tommye Standard, PA-C{    Other Instructions

## 2021-05-27 NOTE — Telephone Encounter (Signed)
Spoke with patient to let her know proof of income documentation is needed: - Federal 1040 Tax Return from most recent tax year -[place], [place] ([place])/Social Security Disability ([place]) and/or Pension Income - Paystubs (3 most recent and consecutive)  Pt reports her proof of income is already at cardiology office and she has reached out to PharmD there for assistance with [place] cost. She would still like me to proceed. I reached out to Audry Riles, CPP for assistance.

## 2021-05-27 NOTE — Progress Notes (Signed)
Remote ICD transmission.   

## 2021-05-30 ENCOUNTER — Encounter: Payer: Self-pay | Admitting: Physician Assistant

## 2021-05-30 NOTE — Telephone Encounter (Signed)
error 

## 2021-05-30 NOTE — Progress Notes (Signed)
Cardiology Office Note Date:  05/30/2021  Patient ID:  Ashley [name], Ashley [name] 1946-02-24, MRN [id number] PCP:  [name], MD  Cardiologist:  Dr. [name] AHF: Dr. [name] Electrophysiologist: Dr. Lovena Le    Chief Complaint: f/u device programming  History of Present Illness: Ashley [name] is a 76 y.o. female with history of HTN, COPD, GERD, fibromyalgia, diverticulitis s/p partil resection, MGUS, morbid obesity, LBBB, NICM, chronic CHF (systolic), ICD.  She come sin today to be seen for Dr. Lovena Le, last seen by him 02/06/2019, noted that no CS lead was placed with unacceptable veins, and put a HIS pacing lead, unfortunately without any preceived clinical benefir or improvement by the patient, despite narroing of her QRS some from 113ms > 129ms Recommended AV optimization echo (which was completed)  She saw Dr. [name] and  02/10/21, doing fairly well, on good medical therapy, not yet felt to need CPX testing  Was off Home O2, occasional postural dizziness , at that time OptiVol looked good reported 100%VP, intolerant to [place]  More recently saw [place]. [place] 04/11/21, getting over a bronchitis, weight was down, , eating out frequently, not doing cardiac rehab  LVEF: 01/07/21: 30% 04/25/20: 20-25% 09/04/19: 30-35% 02/26/19: AV Opt echo, 30-35% 10/15/18: 15-20% 12/30/17: 60-65% 08/07/16: 40-45% 02/04/16: 35-40%  Device clinic alert for reduced effective BI Ve paing % and unable to run LV (HIS) lead threshold Oct 2022 99.5% Jan 4, 23 31.5%   I saw her 05/26/21: She reports that she has not felt well in YEARS, has discussed with every doctor she sees and noone really knows why.  She gets what she needs to do done, and is exhausted at the end of the day Has no energy to spare at any moment I"I always feel horrible" No CP, no palpitations + SOB/DOE no change in years and no acute changes of late She has noted though that at the end of the day after doing her daily chores,  her forearms/inside of her elbows ache, this is improved with BioFreeze and massage, but is new. No near syncope or syncope HIS lead was found to have increased threshold from prior and programmed subthreshold Despite several trys with HIS/RV timing (AV timing made no impact on QRS), with industry, we could not get a QRS duration better then 159ms. Planned to come in to see effective pacing % and and clinically how she was doing  TODAY She is a bit mor tired, thinks perhaps that all the EKGs we did last visit may have triggered her fibromyalgia, also the barometric pressure is lower and that is a trigger for her as well. Noted in the last week or so that she has to take her nap earlier in the day by 1pm rather then 3 or 4pm. She has a chronic productive cough for many years, this is unchanged    Device information MDT single chamber ICD implanted 07/14/11 > upgrade to CRT device though with addition of an RA and HIS lead 11/04/2018   Past Medical History:  Diagnosis Date   Abdominal aortic atherosclerosis ([place]) 11/2015   by CT   Allergy    Anemia    Anxiety    Automatic implantable cardioverter-defibrillator in situ    a. 2012 s/p MDT single lead ICD; b. 10/2018 BiV upgrade-->MDT ser # [id number] H.   Bronchiectasis    a. possibly mild, treat URIs aggressively   Cancer ([place])    basal cell, arms & neck   Chronic sinusitis    COPD (  chronic obstructive pulmonary disease) ([place])    COVID-19 12/06/2020   Depression    Dyspnea    Essential hypertension    Pt denies   Family history of adverse reaction to anesthesia    Mother - PONV   Fibrocystic breast disease    Fibromyalgia    GERD (gastroesophageal reflux disease)    H/O multiple allergies 10/10/2013   Hemorrhoids    HFrEF (heart failure with reduced ejection fraction) ([place])    a. 06/2011 cMRI: EF 34%, no LGE; b. 01/2016 Echo: EF 35-40%; c. 07/2016 Echo: EF 40-45%; d. 02/2020 Echo: EF 20-25%, glob HK. GrI DD. Mildly red RV fxn. Mildly  dil LA. Triv MR.   History of diverticulitis of colon    History of pneumonia 2012   Insomnia    Irritable bowel syndrome    Laryngeal nodule    Dr. Thomasena Edis   Migraine without aura, without mention of intractable migraine without mention of status migrainosus    Mitral regurgitation    Neuropathy    feet and hands   Nonischemic cardiomyopathy ([place])    a. 2012 s/p single lead ICD; b. 06/2011 cMRI: EF 34%, no LGE; b. 01/2016 Echo: EF 35-40%; c. 07/2016 Echo: EF 40-45%; d. 10/2018 Cath: nl cors. EF 20-25%; e. 10/2018 s/p BiV MDT ICD upgrade; f. 02/2020 Echo: EF 20-25%, glob HK. GrI DD.   Osteoarthritis    knees, back, hands, low back,    Osteoporosis 01/2013   T-2.7 spine (01/2016)   PONV (postoperative nausea and vomiting)    sigmoid diverticulitis with perforation, abscess and fistula 09/30/2013   Sleep apnea 2010   borderline, inconclusive- /w [place]     Past Surgical History:  Procedure Laterality Date   ABDOMINAL HYSTERECTOMY  1987   w/BSO   ANAL RECTAL MANOMETRY N/A 02/02/2016   Procedure: ANO RECTAL MANOMETRY;  Surgeon: Mauri Pole, MD;  Location: [facility];  Service: Endoscopy;  Laterality: N/A;   APPENDECTOMY     AUGMENTATION MAMMAPLASTY     BIV UPGRADE N/A 11/04/2018   Procedure: BIV ICD  UPGRADE;  Surgeon: Evans Lance, MD;  Location: [place] [facility];  Service: Cardiovascular;  Laterality: N/A;   breast cystectomies     due to FCBD   BREAST MASS EXCISION  01/2010   Left-fibrocystic change w/intraductal papilloma, no malignancy   carotid US  91/6384   6-65% LICA, normal RICA   CATARACT EXTRACTION W/PHACO Right 03/08/2021   Procedure: CATARACT EXTRACTION PHACO AND INTRAOCULAR LENS PLACEMENT ([place]) RIGHT;  Surgeon: Birder Robson, MD;  Location: [facility];  Service: Ophthalmology;  Laterality: Right;  13.01 1:15.7   [place] Bunionectomy  11/04/08   Right Great Toe (Dr. Beola Cord)   COLONOSCOPY  01/2018   1 small benign polyp, diverticulosis,  healthy anastomosis [name] @ [facility])   COLOSTOMY N/A 10/02/2013   DESCENDING COLOSTOMY;  Surgeon: Imogene Burn. [place], MD   COLOSTOMY REVERSAL N/A 02/10/2014   [name], MD   EMG/MCV  10/16/01   + mild carpal tunnel   ESOPHAGOGASTRODUODENOSCOPY  09/2014   small HH, irregular Z line, fundic gland polyps with mild gastritis Olevia Perches)   Flex laryngoscopy  06/11/06   ([place]) nml   IMPLANTABLE CARDIOVERTER DEFIBRILLATOR IMPLANT N/A 07/14/2011   MDT single chamber ICD   KNEE ARTHROSCOPY W/ ORIF     Left; "meniscus tear"   LASIK     left eye   MANDIBLE SURGERY     "got about 6 pins in the  bottom of my jaw"   NCS  11/2014   L ulnar neuropathy, mild B median nerve entrapment ([place])   OSTEOTOMY     Left foot   PARTIAL COLECTOMY N/A 10/02/2013   PARTIAL COLECTOMY;  Surgeon: Imogene Burn. [place], MD   RIGHT/LEFT HEART CATH AND CORONARY ANGIOGRAPHY N/A 10/16/2018   Procedure: RIGHT/LEFT HEART CATH AND CORONARY ANGIOGRAPHY;  Surgeon: Wellington Hampshire, MD;  Location: [facility];  Service: Cardiovascular;  Laterality: N/A;   sinus surgery     x3 with balloon    TOE AMPUTATION     "toe beside baby toe on left foot; got gangrene from corn"   TONSILLECTOMY AND ADENOIDECTOMY      Current Outpatient Medications  Medication Sig Dispense Refill   acetaminophen (TYLENOL) 500 MG tablet Take 500 mg by mouth daily as needed for moderate pain or headache (PAIN).      AMBULATORY NON FORMULARY MEDICATION Medication Name:  1 squatty potty to use as needed 1 each 0   Ascorbic Acid (VITAMIN C) 1000 MG tablet Take 1,000 mg by mouth See admin instructions. Take 1000 mg daily, may take an additional 1000 mg as needed for cold symptoms     aspirin EC 81 MG tablet Take 81 mg by mouth at bedtime.      beclomethasone (QVAR) 80 MCG/ACT inhaler Inhale 2 puffs into the lungs 2 (two) times daily. 1 each 5   benzonatate (TESSALON) 200 MG capsule Take 1 capsule (200 mg total) by mouth 3 (three) times daily as needed for cough.  30 capsule 6   Biotin 10 MG CAPS Take 1 tablet by mouth daily.     carvedilol (COREG) 6.25 MG tablet Take 0.5 tablets (3.125 mg total) by mouth 2 (two) times daily with a meal. 90 tablet 1   Cholecalciferol (VITAMIN D3) 50 MCG (2000 UT) capsule Take 1 capsule (2,000 Units total) by mouth daily.     Coenzyme Q10 (COQ-10) 100 MG CAPS Take 100 mg by mouth daily.     dextromethorphan (DELSYM) 30 MG/5ML liquid Take 15 mg by mouth daily as needed for cough.      ezetimibe (ZETIA) 10 MG tablet TAKE 1 TABLET BY MOUTH EVERY DAY 90 tablet 3   fluorouracil (EFUDEX) 5 % cream Apply topically.     GARLIC PO Take 1 capsule by mouth daily.     guaiFENesin (MUCINEX) 600 MG 12 hr tablet Take 600 mg by mouth 2 (two) times daily.     levalbuterol (XOPENEX HFA) 45 MCG/ACT inhaler Inhale 2 puffs into the lungs every 6 (six) hours as needed for wheezing or shortness of breath. 15 g 3   LORazepam (ATIVAN) 0.5 MG tablet Take 0.5-1 tablets (0.25-0.5 mg total) by mouth at bedtime as needed for anxiety or sleep. 30 tablet 1   magnesium 30 MG tablet Take 30 mg by mouth daily.     Menthol, Topical Analgesic, (BIOFREEZE ROLL-ON EX) Apply topically.     miconazole (MICOTIN) 2 % powder Apply 1 application topically as needed (yeast).     nystatin cream (MYCOSTATIN) Apply 1 application topically as needed for dry skin (yeast infection). 30 g 3   Omega-3 Fatty Acids (FISH OIL) 1000 MG CAPS Take 1 capsule (1,000 mg total) by mouth daily.  0   OXYGEN Inhale into the lungs as needed.     pantoprazole (PROTONIX) 40 MG tablet Take 40 mg by mouth every Monday, Wednesday, and Friday.     potassium chloride (KLOR-CON)  10 MEQ tablet Take 1 tablet (10 mEq total) by mouth 3 (three) times a week. (Patient taking differently: Take 10 mEq by mouth 3 (three) times a week. Pt takes about once/week with torsemide.) 40 tablet 1   pyridOXINE (VITAMIN B-6) 100 MG tablet Take 100 mg by mouth daily.     Red Yeast Rice Extract (RED YEAST RICE PO) Take  1 capsule by mouth in the morning and at bedtime.     sacubitril-valsartan (ENTRESTO) 49-51 MG Take 1 tablet by mouth daily. 30 tablet 6   spironolactone (ALDACTONE) 25 MG tablet Take 1 tablet (25 mg total) by mouth daily. 90 tablet 1   torsemide (DEMADEX) 10 MG tablet Take 1 tablet (10 mg total) by mouth 3 (three) times a week. (Patient taking differently: Take 10 mg by mouth 3 (three) times a week. Pt takes once weekly as of 05/24/21) 40 tablet 1   triamcinolone (NASACORT) 55 MCG/ACT AERO nasal inhaler Place into the nose.     vitamin E 400 UNIT capsule Take 400 Units by mouth daily.     No current facility-administered medications for this visit.    Allergies:   Atorvastatin; Chlorhexidine; Codeine; Cyclobenzaprine; Cymbalta [duloxetine hcl]; Diflucan [fluconazole]; Dilaudid [hydromorphone hcl]; Furosemide; Influenza vac split [influenza virus vaccine]; Latex; Morphine and related; Pravastatin; Prevacid [lansoprazole]; Prevnar [pneumococcal 13-val conj vacc]; Rosuvastatin; Simvastatin; Tape; Farxiga [dapagliflozin]; Metaxalone; Tramadol; Antihistamines, chlorpheniramine-type; Betadine [povidone iodine]; Onion; Other; Penicillins; Red dye; Sertraline; and Sulfasalazine   Social History:  The patient  reports that she has never smoked. She has never used smokeless tobacco. She reports that she does not drink alcohol and does not use drugs.   Family History:  The patient's family history includes Alcohol abuse in her paternal grandfather; Arthritis in her mother; Colon polyps in her father; Dementia in her mother; Emphysema in her mother; Heart disease in her paternal grandfather and paternal grandmother; Hypertension in her maternal grandfather; Lung cancer in her father; Osteoporosis in her mother; Stroke in her mother and paternal grandfather.  ROS:  Please see the history of present illness.    All other systems are reviewed and otherwise negative.   PHYSICAL EXAM:  VS:  There were no vitals  taken for this visit. BMI: There is no height or weight on file to calculate BMI. Well nourished, well developed, in no acute distress HEENT: normocephalic, atraumatic Neck: no JVD, carotid bruits or masses Cardiac:  RRR; no significant murmurs, no rubs, or gallops Lungs:  CTA b/l, no wheezing, rhonchi or rales Abd: soft, nontender, significant hernia MS: no deformity or atrophy Ext: trace if any edema Skin: warm and dry, no rash Neuro:  No gross deficits appreciated Psych: euthymic mood, full affect  ICD site is stable, no tethering or discomfort   EKG:  Done today and reviewed by myself shows  SR, V paced, QRS duration by machine 144, manually measured shorter 120-151ms  Device interrogation done today and reviewed by myself:  Battery, lead measurements are good No arrhythmias OptiVol is all the way down No effective pacing data yet (in d/w tech support, will start to collect after today, requiring a programmed check after programming changes made last visit) 99.1% VP  826/22: TTE IMPRESSIONS   1. Left ventricular ejection fraction, by estimation, is 30 %. The left  ventricle has moderately decreased function. The left ventricle  demonstrates regional wall motion abnormalities (see scoring  diagram/findings for description). Left ventricular  diastolic parameters are consistent with Grade I diastolic dysfunction  (  impaired relaxation).   2. Right ventricular systolic function is normal. The right ventricular  size is normal. There is normal pulmonary artery systolic pressure  09/38/18: TTE IMPRESSIONS   1. Left ventricular ejection fraction, by estimation, is 20 to 25%. The  left ventricle has severely decreased function. The left ventricle  demonstrates global hypokinesis. There is mild left ventricular  hypertrophy. Left ventricular diastolic parameters   are consistent with Grade I diastolic dysfunction (impaired relaxation).   2. Right ventricular systolic function  is mildly reduced. The right  ventricular size is normal. There is normal pulmonary artery systolic  pressure.   3. Left atrial size was mildly dilated.   4. The mitral valve is degenerative. Trivial mitral valve regurgitation.  Moderate mitral annular calcification.   5. The aortic valve is tricuspid. Aortic valve regurgitation is not  visualized. No aortic stenosis is present.   6. The inferior vena cava is normal in size with greater than 50%  respiratory variability, suggesting right atrial pressure of 3 mmHg.   09/04/19: LVEF 30-35%   02/26/2019: TTE (AV opt) IMPRESSIONS   1. Left ventricular ejection fraction, by visual estimation, is 30 to  35%. The left ventricle has severely decreased function. There is no left  ventricular hypertrophy.   2. Severe global hypokinesis.   3. Elevated left atrial and left ventricular end-diastolic pressures.   4. Left ventricular diastolic Doppler parameters are consistent with  impaired relaxation pattern of LV diastolic filling.   5. Global right ventricle has normal systolic function.The right  ventricular size is normal. No increase in right ventricular wall  thickness.   6. The mitral valve is abnormal. Moderate mitral valve regurgitation.   7. A pacer wire is visualized.   8. This study was used by the cardiac electrophysiologist to optimize AV  pacer settings.   9. Left atrial size was mildly dilated.  10. The interatrial septum was not assessed.  11. Right atrial size was normal.  12. The aortic valve is tricuspid Aortic valve regurgitation was not  visualized by color flow Doppler. Mild aortic valve sclerosis without  stenosis.  13. The tricuspid valve is grossly normal. Tricuspid valve regurgitation  is mild.  14. The pulmonic valve was grossly normal. Pulmonic valve regurgitation is  not visualized by color flow Doppler.  15. The inferior vena cava is normal in size with greater than 50%  respiratory variability, suggesting  right atrial pressure of 3 mmHg.   In comparison to the previous echocardiogram(s): Prior study on 10/14/2018,  LVEF 15-20%.   RHC/LHC 10/16/2018  LVEF 20-25. Normal coronaries RA 8 PCWP 21 CO 4.45 CI 2.35    ECHO 10/14/2018 EF 15-20%. RV normal, Severe central mitral valve regurgitation.     1. The left ventricle has a 2D calculated ejection fraction 15-20%%. The cavity size was mildly dilated. Left ventricular diastolic Doppler parameters are indeterminate.  2. The right ventricle has normal systolic function. The cavity was normal. There is no increase in right ventricular wall thickness.  3. Left atrial size was moderately dilated.  4. The mitral valve is rheumatic. Moderate thickening of the mitral valve leaflet. There is mild to moderate mitral annular calcification present. Mitral valve regurgitation is severe by color flow Doppler. The MR jet is centrally-directed.   Echo 8/22: EF 30%, RV normal   Recent Labs: 12/02/2020: B Natriuretic Peptide 15.1 12/03/2020: Magnesium 2.1 02/07/2021: Pro B Natriuretic peptide (BNP) 21.0; TSH 0.79 04/01/2021: ALT 24; BUN 24; Creatinine, Ser 0.91; Hemoglobin  12.8; Platelets 201.0; Potassium 4.5; Sodium 139  04/01/2021: Cholesterol 194; HDL 49.90; LDL Cholesterol 108; Total CHOL/HDL Ratio 4; Triglycerides 183.0; VLDL 36.6   CrCl cannot be calculated (Patient's most recent lab result is older than the maximum 21 days allowed.).   Wt Readings from Last 3 Encounters:  05/26/21 185 lb 12.8 oz (84.3 kg)  04/15/21 184 lb (83.5 kg)  04/11/21 184 lb (83.5 kg)     Other studies reviewed: Additional studies/records reviewed today include: summarized above  ASSESSMENT AND PLAN:  CRT-D RA, RV, HIS lead system Has had AV echo optimization historically  EKG looks better, so does her Optivol No further optimization was done today  NICM Chronic CHF (systolic) Chronic symptoms, unchanged Does not appear volume OL by exam Weight is stable Optivol is  way down (without med adjustment) Labs are up to date She sees HF team in March    Disposition: remotes as usual, HF team as scheduled, Dr. Alecia Lemming in 39mo, sooner if needed  Current medicines are reviewed at length with the patient today.  The patient did not have any concerns regarding medicines.  Venetia Night, PA-C 05/30/2021 11:34 AM     [facility] [place] [address] [phone] (office)  [phone] (fax)

## 2021-05-31 ENCOUNTER — Telehealth (HOSPITAL_COMMUNITY): Payer: Self-pay | Admitting: Pharmacy Technician

## 2021-05-31 ENCOUNTER — Other Ambulatory Visit (HOSPITAL_COMMUNITY): Payer: Self-pay

## 2021-05-31 NOTE — Telephone Encounter (Signed)
Advanced Heart Failure Patient Advocate Encounter  Patient called in requesting assistance with Entresto. She is eligible for a copay card in this instance (she is on [employer] plan). Current 90 day co-pay $315 (one qd). Copay card took it down to $10.   BIN [id number] PCN [id number] [id number] ID [id number]  Patient should have been emailed a copy of the card from the company, I also sent her a picture of the card. Sent 90 day RX request to [place] (CMA) to send to [facility] with the copay card information attached.  Charlann Boxer, CPhT

## 2021-06-06 ENCOUNTER — Encounter: Payer: Self-pay | Admitting: Physician Assistant

## 2021-06-06 ENCOUNTER — Other Ambulatory Visit: Payer: Self-pay

## 2021-06-06 ENCOUNTER — Ambulatory Visit (INDEPENDENT_AMBULATORY_CARE_PROVIDER_SITE_OTHER): Payer: Medicare Other | Admitting: Physician Assistant

## 2021-06-06 VITALS — BP 102/60 | HR 88 | Ht 65.0 in | Wt 185.8 lb

## 2021-06-06 DIAGNOSIS — L720 Epidermal cyst: Secondary | ICD-10-CM | POA: Diagnosis not present

## 2021-06-06 DIAGNOSIS — Z95810 Presence of automatic (implantable) cardiac defibrillator: Secondary | ICD-10-CM | POA: Diagnosis not present

## 2021-06-06 DIAGNOSIS — L648 Other androgenic alopecia: Secondary | ICD-10-CM | POA: Diagnosis not present

## 2021-06-06 DIAGNOSIS — L821 Other seborrheic keratosis: Secondary | ICD-10-CM | POA: Diagnosis not present

## 2021-06-06 DIAGNOSIS — L578 Other skin changes due to chronic exposure to nonionizing radiation: Secondary | ICD-10-CM | POA: Diagnosis not present

## 2021-06-06 DIAGNOSIS — Z85828 Personal history of other malignant neoplasm of skin: Secondary | ICD-10-CM | POA: Diagnosis not present

## 2021-06-06 DIAGNOSIS — L570 Actinic keratosis: Secondary | ICD-10-CM | POA: Diagnosis not present

## 2021-06-06 DIAGNOSIS — I428 Other cardiomyopathies: Secondary | ICD-10-CM

## 2021-06-06 DIAGNOSIS — I5022 Chronic systolic (congestive) heart failure: Secondary | ICD-10-CM

## 2021-06-06 LAB — CUP PACEART INCLINIC DEVICE CHECK
Battery Remaining Longevity: 43 mo
Battery Voltage: 2.96 V
Brady Statistic AP VP Percent: 0.02 %
Brady Statistic AP VS Percent: 0.03 %
Brady Statistic AS VP Percent: 99.31 %
Brady Statistic AS VS Percent: 0.64 %
Brady Statistic RA Percent Paced: 0.05 %
Brady Statistic RV Percent Paced: 99.06 %
Date Time Interrogation Session: 20230123101405
HighPow Impedance: 71 Ohm
Implantable Lead Implant Date: 20130301
Implantable Lead Implant Date: 20200622
Implantable Lead Implant Date: 20200622
Implantable Lead Location: 753859
Implantable Lead Location: 753860
Implantable Lead Location: 753860
Implantable Lead Model: 3830
Implantable Lead Model: 5076
Implantable Lead Model: 6935
Implantable Pulse Generator Implant Date: 20200622
Lead Channel Impedance Value: 304 Ohm
Lead Channel Impedance Value: 342 Ohm
Lead Channel Impedance Value: 380 Ohm
Lead Channel Impedance Value: 399 Ohm
Lead Channel Impedance Value: 399 Ohm
Lead Channel Impedance Value: 570 Ohm
Lead Channel Pacing Threshold Amplitude: 0.375 V
Lead Channel Pacing Threshold Amplitude: 0.5 V
Lead Channel Pacing Threshold Amplitude: 1.375 V
Lead Channel Pacing Threshold Pulse Width: 0.4 ms
Lead Channel Pacing Threshold Pulse Width: 0.4 ms
Lead Channel Pacing Threshold Pulse Width: 0.4 ms
Lead Channel Sensing Intrinsic Amplitude: 1.875 mV
Lead Channel Sensing Intrinsic Amplitude: 10 mV
Lead Channel Sensing Intrinsic Amplitude: 2.375 mV
Lead Channel Sensing Intrinsic Amplitude: 7.25 mV
Lead Channel Setting Pacing Amplitude: 1.5 V
Lead Channel Setting Pacing Amplitude: 2 V
Lead Channel Setting Pacing Amplitude: 2.5 V
Lead Channel Setting Pacing Pulse Width: 0.4 ms
Lead Channel Setting Pacing Pulse Width: 0.8 ms
Lead Channel Setting Sensing Sensitivity: 0.3 mV

## 2021-06-06 NOTE — Patient Instructions (Signed)
Medication Instructions:   Your physician recommends that you continue on your current medications as directed. Please refer to the Current Medication list given to you today.'  *If you need a refill on your cardiac medications before your next appointment, please call your pharmacy*   Lab Work: NONE ORDERED  TODAY   If you have labs (blood work) drawn today and your tests are completely normal, you will receive your results only by: . MyChart Message (if you have MyChart) OR . A paper copy in the mail If you have any lab test that is abnormal or we need to change your treatment, we will call you to review the results.   Testing/Procedures: NONE ORDERED  TODAY    Follow-Up: At [facility], you and your health needs are our priority.  As part of our continuing mission to provide you with exceptional heart care, we have created designated Provider Care Teams.  These Care Teams include your primary Cardiologist (physician) and Advanced Practice Providers (APPs -  Physician Assistants and Nurse Practitioners) who all work together to provide you with the care you need, when you need it.  We recommend signing up for the patient portal called "MyChart".  Sign up information is provided on this After Visit Summary.  MyChart is used to connect with patients for Virtual Visits (Telemedicine).  Patients are able to view lab/test results, encounter notes, upcoming appointments, etc.  Non-urgent messages can be sent to your provider as well.   To learn more about what you can do with MyChart, go to [email or web address].    Your next appointment:   6 month(s)  The format for your next appointment:   In Person  Provider:   Gregg Taylor, MD   Other Instructions  

## 2021-06-08 NOTE — Telephone Encounter (Signed)
Pt is now using copay card provided through cardiology office. No further action needed.

## 2021-06-09 ENCOUNTER — Encounter: Payer: Medicare Other | Admitting: Physician Assistant

## 2021-06-11 ENCOUNTER — Other Ambulatory Visit: Payer: Self-pay | Admitting: Nurse Practitioner

## 2021-06-14 DIAGNOSIS — I5022 Chronic systolic (congestive) heart failure: Secondary | ICD-10-CM | POA: Diagnosis not present

## 2021-06-14 DIAGNOSIS — E782 Mixed hyperlipidemia: Secondary | ICD-10-CM

## 2021-06-27 DIAGNOSIS — Z20822 Contact with and (suspected) exposure to covid-19: Secondary | ICD-10-CM | POA: Diagnosis not present

## 2021-06-30 ENCOUNTER — Encounter: Payer: Self-pay | Admitting: Emergency Medicine

## 2021-06-30 ENCOUNTER — Emergency Department (HOSPITAL_COMMUNITY): Payer: Medicare Other

## 2021-06-30 ENCOUNTER — Telehealth: Payer: Self-pay

## 2021-06-30 ENCOUNTER — Ambulatory Visit
Admission: EM | Admit: 2021-06-30 | Discharge: 2021-06-30 | Disposition: A | Discharge status: Other Acute Inpatient Hospital | Payer: Medicare Other

## 2021-06-30 ENCOUNTER — Emergency Department (HOSPITAL_COMMUNITY)
Admission: EM | Admit: 2021-06-30 | Discharge: 2021-06-30 | Disposition: A | Discharge status: Home / Self Care | Payer: Medicare Other | Attending: Emergency Medicine | Admitting: Emergency Medicine

## 2021-06-30 ENCOUNTER — Other Ambulatory Visit: Payer: Self-pay

## 2021-06-30 DIAGNOSIS — R079 Chest pain, unspecified: Secondary | ICD-10-CM | POA: Diagnosis not present

## 2021-06-30 DIAGNOSIS — Z8719 Personal history of other diseases of the digestive system: Secondary | ICD-10-CM

## 2021-06-30 DIAGNOSIS — R197 Diarrhea, unspecified: Secondary | ICD-10-CM

## 2021-06-30 DIAGNOSIS — I700 Atherosclerosis of aorta: Secondary | ICD-10-CM | POA: Diagnosis not present

## 2021-06-30 DIAGNOSIS — R1084 Generalized abdominal pain: Secondary | ICD-10-CM | POA: Diagnosis not present

## 2021-06-30 DIAGNOSIS — Z91040 Latex allergy status: Secondary | ICD-10-CM | POA: Insufficient documentation

## 2021-06-30 DIAGNOSIS — K5792 Diverticulitis of intestine, part unspecified, without perforation or abscess without bleeding: Secondary | ICD-10-CM

## 2021-06-30 DIAGNOSIS — D72829 Elevated white blood cell count, unspecified: Secondary | ICD-10-CM | POA: Insufficient documentation

## 2021-06-30 DIAGNOSIS — R1013 Epigastric pain: Secondary | ICD-10-CM | POA: Diagnosis present

## 2021-06-30 DIAGNOSIS — R109 Unspecified abdominal pain: Secondary | ICD-10-CM | POA: Diagnosis not present

## 2021-06-30 DIAGNOSIS — K5732 Diverticulitis of large intestine without perforation or abscess without bleeding: Secondary | ICD-10-CM | POA: Diagnosis not present

## 2021-06-30 DIAGNOSIS — R9431 Abnormal electrocardiogram [ECG] [EKG]: Secondary | ICD-10-CM | POA: Diagnosis not present

## 2021-06-30 LAB — COMPREHENSIVE METABOLIC PANEL
ALT: 32 U/L (ref 0–44)
AST: 26 U/L (ref 15–41)
Albumin: 3.7 g/dL (ref 3.5–5.0)
Alkaline Phosphatase: 102 U/L (ref 38–126)
Anion gap: 11 (ref 5–15)
BUN: 19 mg/dL (ref 8–23)
CO2: 21 mmol/L — ABNORMAL LOW (ref 22–32)
Calcium: 9.5 mg/dL (ref 8.9–10.3)
Chloride: 106 mmol/L (ref 98–111)
Creatinine, Ser: 0.84 mg/dL (ref 0.44–1.00)
GFR, Estimated: >60 mL/min (ref >60)
Glucose, Bld: 93 mg/dL (ref 70–99)
Potassium: 4 mmol/L (ref 3.5–5.1)
Sodium: 138 mmol/L (ref 135–145)
Total Bilirubin: 0.4 mg/dL (ref 0.3–1.2)
Total Protein: 7.4 g/dL (ref 6.5–8.1)

## 2021-06-30 LAB — LIPASE, BLOOD: Lipase: 36 U/L (ref 11–51)

## 2021-06-30 LAB — CBC WITH DIFFERENTIAL/PLATELET
Abs Immature Granulocytes: 0.09 10*3/uL — ABNORMAL HIGH (ref 0.00–0.07)
Basophils Absolute: 0 10*3/uL (ref 0.0–0.1)
Basophils Relative: 0 %
Eosinophils Absolute: 0.1 10*3/uL (ref 0.0–0.5)
Eosinophils Relative: 0 %
HCT: 39.2 % (ref 36.0–46.0)
Hemoglobin: 12.6 g/dL (ref 12.0–15.0)
Immature Granulocytes: 1 %
Lymphocytes Relative: 11 %
Lymphs Abs: 1.7 10*3/uL (ref 0.7–4.0)
MCH: 30.1 pg (ref 26.0–34.0)
MCHC: 32.1 g/dL (ref 30.0–36.0)
MCV: 93.6 fL (ref 80.0–100.0)
Monocytes Absolute: 1.5 10*3/uL — ABNORMAL HIGH (ref 0.1–1.0)
Monocytes Relative: 10 %
Neutro Abs: 12.3 10*3/uL — ABNORMAL HIGH (ref 1.7–7.7)
Neutrophils Relative %: 78 %
Platelets: 191 10*3/uL (ref 150–400)
RBC: 4.19 MIL/uL (ref 3.87–5.11)
RDW: 13.1 % (ref 11.5–15.5)
WBC: 15.8 10*3/uL — ABNORMAL HIGH (ref 4.0–10.5)
nRBC: 0 % (ref 0.0–0.2)

## 2021-06-30 LAB — URINALYSIS, ROUTINE W REFLEX MICROSCOPIC
Bilirubin Urine: NEGATIVE
Glucose, UA: NEGATIVE mg/dL
Hgb urine dipstick: NEGATIVE
Ketones, ur: NEGATIVE mg/dL
Nitrite: NEGATIVE
Protein, ur: NEGATIVE mg/dL
Specific Gravity, Urine: 1.021 (ref 1.005–1.030)
pH: 5 (ref 5.0–8.0)

## 2021-06-30 MED ORDER — FENTANYL CITRATE PF 50 MCG/ML IJ SOSY
50.0000 ug | PREFILLED_SYRINGE | Freq: Once | INTRAMUSCULAR | Status: DC
  Filled 2021-06-30: qty 1

## 2021-06-30 MED ORDER — ONDANSETRON HCL 4 MG/2ML IJ SOLN
4.0000 mg | Freq: Once | INTRAMUSCULAR | Status: DC
  Filled 2021-06-30: qty 2

## 2021-06-30 MED ORDER — IOHEXOL 300 MG/ML  SOLN
80.0000 mL | Freq: Once | INTRAMUSCULAR | Status: AC | PRN
Start: 2021-06-30 — End: 2021-06-30
  Administered 2021-06-30: 80 mL via INTRAVENOUS

## 2021-06-30 NOTE — Telephone Encounter (Signed)
Louie Casa RN with access nurse said that pt called with abd pain and diarrhea. Pt has hx of diverticulitis, hx of diverticular abscess and portion of colon previously removed and IBS. Now pt has abd pain and diarrhea and access disposition is to be seen within 4 hrs. No available appts at [facility]; Louie Casa advised pt to call GI but pt said has been a while since saw GI or go to [facility] or ED. Louie Casa said pt refused UC or ED and pt would call for appt. I spoke with pt; pt said she is having a generalized soreness all over abd; upper and lower abd feels like pt has been hit in abd several times. No bleeding seen. I checked wait time at [facility] which is now 15 mins. Pt said she is going to [facility] to be eval. Pt said going to ED does her no good. Sending note to Dr Darnell Level as Juluis Rainier as PCP who is out of office and Dr Glori Bickers who is in office to review this note since she is a provider in office and [place] CMA.,will add access nurse note when available.

## 2021-06-30 NOTE — ED Provider Notes (Signed)
[facility] EMERGENCY DEPARTMENT Provider Note   CSN: [id number] Arrival date & time: 06/30/21  1328     History  Chief Complaint  Patient presents with   Abdominal Pain    Ashley [name] is a 76 y.o. female.  76 yo F with a chief complaints of abdominal discomforts and diarrhea.  This been going on for about 3 to 4 days.  She feels like it is worse in the epigastric region all the way across.  She tells me that she had a history of multiple intra-abdominal abscesses that required antibiotics and surgical intervention ended up having part of her colon removed.  She has had some chills but denies fevers.  Her symptoms started after she had some severe coughing fits and so she wonders if she has pneumonia.  No continued persistent coughing.   Abdominal Pain     Home Medications Prior to Admission medications   Medication Sig Start Date End Date Taking? Authorizing Provider  acetaminophen (TYLENOL) 500 MG tablet Take 500 mg by mouth daily as needed for moderate pain or headache (PAIN).     [provider]  AMBULATORY NON FORMULARY MEDICATION Medication Name:  1 squatty potty to use as needed 01/20/16   Mauri Pole, MD  Ascorbic Acid (VITAMIN C) 1000 MG tablet Take 1,000 mg by mouth See admin instructions. Take 1000 mg daily, may take an additional 1000 mg as needed for cold symptoms    [provider]  aspirin EC 81 MG tablet Take 81 mg by mouth at bedtime.     [provider]  beclomethasone (QVAR) 80 MCG/ACT inhaler Inhale 2 puffs into the lungs 2 (two) times daily. 03/04/21   Parrett, Fonnie Mu, NP  benzonatate (TESSALON) 200 MG capsule Take 1 capsule (200 mg total) by mouth 3 (three) times daily as needed for cough. 12/31/20   [name], MD  Biotin 10 MG CAPS Take 1 tablet by mouth daily.    [provider]  carvedilol (COREG) 6.25 MG tablet Take 0.5 tablets (3.125 mg total) by mouth 2 (two) times daily with a  meal. 01/05/21   Theora Gianotti, NP  Cholecalciferol (VITAMIN D3) 50 MCG (2000 UT) capsule Take 1 capsule (2,000 Units total) by mouth daily. 04/15/21   [name], MD  Coenzyme Q10 (COQ-10) 100 MG CAPS Take 100 mg by mouth daily.    [provider]  dextromethorphan (DELSYM) 30 MG/5ML liquid Take 15 mg by mouth daily as needed for cough.     [provider]  ezetimibe (ZETIA) 10 MG tablet TAKE 1 TABLET BY MOUTH EVERY DAY 05/03/21   Minna Merritts, MD  fluorouracil (EFUDEX) 5 % cream Apply topically. 06/05/20   [provider]  GARLIC PO Take 1 capsule by mouth daily.    [provider]  guaiFENesin (MUCINEX) 600 MG 12 hr tablet Take 600 mg by mouth 2 (two) times daily.    [provider]  levalbuterol Penne Lash HFA) 45 MCG/ACT inhaler Inhale 2 puffs into the lungs every 6 (six) hours as needed for wheezing or shortness of breath. 03/04/21   Parrett, Fonnie Mu, NP  LORazepam (ATIVAN) 0.5 MG tablet Take 0.5-1 tablets (0.25-0.5 mg total) by mouth at bedtime as needed for anxiety or sleep. 03/22/20   [name], MD  magnesium 30 MG tablet Take 30 mg by mouth daily.    [provider]  Menthol, Topical Analgesic, (BIOFREEZE ROLL-ON EX) Apply topically.  [provider]  miconazole (MICOTIN) 2 % powder Apply 1 application topically as needed (yeast).    [provider]  nystatin cream (MYCOSTATIN) Apply 1 application topically as needed for dry skin (yeast infection). 04/15/21   [name], MD  Omega-3 Fatty Acids (FISH OIL) 1000 MG CAPS Take 1 capsule (1,000 mg total) by mouth daily. 12/04/20   Patrecia Pour, MD  OXYGEN Inhale into the lungs as needed.    [provider]  pantoprazole (PROTONIX) 40 MG tablet Take 40 mg by mouth every Monday, Wednesday, and Friday. 08/11/20   [name], MD  potassium chloride (KLOR-CON) 10 MEQ tablet Take 1 tablet (10 mEq total) by mouth 3 (three) times a  week. 01/05/21   Theora Gianotti, NP  pyridOXINE (VITAMIN B-6) 100 MG tablet Take 100 mg by mouth daily.    [provider]  Red Yeast Rice Extract (RED YEAST RICE PO) Take 1 capsule by mouth in the morning and at bedtime.    [provider]  sacubitril-valsartan (ENTRESTO) 49-51 MG Take 1 tablet by mouth daily. 05/26/21   Baldwin Jamaica, PA-C  spironolactone (ALDACTONE) 25 MG tablet Take 1 tablet (25 mg total) by mouth daily. 01/05/21   Theora Gianotti, NP  torsemide (DEMADEX) 10 MG tablet TAKE 1 TABLET BY MOUTH 3 TIMES A WEEK. 06/13/21   Theora Gianotti, NP  triamcinolone (NASACORT) 55 MCG/ACT AERO nasal inhaler Place into the nose.    [provider]  vitamin E 400 UNIT capsule Take 400 Units by mouth daily.    [provider]  bisoprolol (ZEBETA) 5 MG tablet Take 5 mg by mouth daily. 06/06/11 07/02/18  Minna Merritts, MD      Allergies    Atorvastatin; Chlorhexidine; Codeine; Cyclobenzaprine; Cymbalta [duloxetine hcl]; Diflucan [fluconazole]; Dilaudid [hydromorphone hcl]; Furosemide; Influenza vac split [influenza virus vaccine]; Latex; Morphine and related; Pravastatin; Prevacid [lansoprazole]; Prevnar [pneumococcal 13-val conj vacc]; Rosuvastatin; Simvastatin; Tape; Farxiga [dapagliflozin]; Metaxalone; Tramadol; Antihistamines, chlorpheniramine-type; Betadine [povidone iodine]; Onion; Other; Penicillins; Red dye; Sertraline; and Sulfasalazine    Review of Systems   Review of Systems  Gastrointestinal:  Positive for abdominal pain.   Physical Exam Updated Vital Signs BP 138/61    Pulse 100    Temp 98.4 F (36.9 C)    Resp 16    SpO2 98%  Physical Exam Vitals and nursing note reviewed.  Constitutional:      General: She is not in acute distress.    Appearance: She is well-developed. She is not diaphoretic.  HENT:     Head: Normocephalic and atraumatic.  Eyes:     Pupils: Pupils are equal, round, and reactive to light.   Cardiovascular:     Rate and Rhythm: Normal rate and regular rhythm.     Heart sounds: No murmur heard.   No friction rub. No gallop.  Pulmonary:     Effort: Pulmonary effort is normal.     Breath sounds: No wheezing or rales.  Abdominal:     General: There is no distension.     Palpations: Abdomen is soft.     Tenderness: There is abdominal tenderness.     Comments: Diffusely tender about the upper abdomen without obvious focality.  Musculoskeletal:        General: No tenderness.     Cervical back: Normal range of motion and neck supple.  Skin:    General: Skin is warm and dry.  Neurological:     Mental Status:  She is alert and oriented to person, place, and time.  Psychiatric:        Behavior: Behavior normal.    ED Results / Procedures / Treatments   Labs (all labs ordered are listed, but only abnormal results are displayed) Labs Reviewed  COMPREHENSIVE METABOLIC PANEL - Abnormal; Notable for the following components:      Result Value   CO2 21 (*)    All other components within normal limits  CBC WITH DIFFERENTIAL/PLATELET - Abnormal; Notable for the following components:   WBC 15.8 (*)    Neutro Abs 12.3 (*)    Monocytes Absolute 1.5 (*)    Abs Immature Granulocytes 0.09 (*)    All other components within normal limits  URINALYSIS, ROUTINE W REFLEX MICROSCOPIC - Abnormal; Notable for the following components:   Leukocytes,Ua TRACE (*)    Bacteria, UA RARE (*)    All other components within normal limits  LIPASE, BLOOD    EKG EKG Interpretation  Date/Time:  Thursday June 30 2021 20:40:52 EST Ventricular Rate:  91 PR Interval:  138 QRS Duration: 171 QT Interval:  391 QTC Calculation: 482 R Axis:   -47 Text Interpretation: Sinus rhythm Left bundle branch block No significant change since last tracing Confirmed by Deno Etienne [phone]) on 06/30/2021 9:57:22 PM  Radiology CT ABDOMEN PELVIS W CONTRAST  Result Date: 06/30/2021 CLINICAL DATA:  Abdominal pain,  acute, nonlocalized EXAM: CT ABDOMEN AND PELVIS WITH CONTRAST TECHNIQUE: Multidetector CT imaging of the abdomen and pelvis was performed using the standard protocol following bolus administration of intravenous contrast. RADIATION DOSE REDUCTION: This exam was performed according to the departmental dose-optimization program which includes automated exposure control, adjustment of the mA and/or kV according to patient size and/or use of iterative reconstruction technique. CONTRAST:  65mL OMNIPAQUE IOHEXOL 300 MG/ML  SOLN COMPARISON:  CT abdomen pelvis 09/01/2019, CT abdomen pelvis 06/18/2017 FINDINGS: Lower chest: Bilateral lower lobe subsegmental atelectasis. Partially visualized cardiac leads. No acute abnormality. Tiny hiatal hernia. Hepatobiliary: No focal liver abnormality. No gallstones, gallbladder wall thickening, or pericholecystic fluid. No biliary dilatation. Pancreas: No focal lesion. Normal pancreatic contour. No surrounding inflammatory changes. No main pancreatic ductal dilatation. Spleen: Normal in size without focal abnormality. Adrenals/Urinary Tract: No adrenal nodule bilaterally. Bilateral kidneys enhance symmetrically. Bilateral renal cortical scarring. Subcentimeter hypodensities are too small to characterize. There is a 1.3 cm fluid density lesion within left kidney that likely represents a simple renal cyst. Similar pericentimeter findings within the right kidney. There is a 1.4 cm hypodense lesion within the right kidney with a density of 47 Hounsfield units. No hydronephrosis. No hydroureter. The urinary bladder is unremarkable. Stomach/Bowel: Rectosigmoid surgical changes noted. Stomach is within normal limits. Grossly similar-appearing chronic second portion of the duodenum diverticula. No evidence of small bowel wall thickening or dilatation. Colonic diverticulosis. Associated bowel wall thickening and pericolonic fat stranding of the splenic flexure/proximal descending colon. Status  post appendectomy. Vascular/Lymphatic: No abdominal aorta or iliac aneurysm. Mild atherosclerotic plaque of the aorta and its branches. No abdominal, pelvic, or inguinal lymphadenopathy. Reproductive: Status post hysterectomy. No adnexal masses. Other: No intraperitoneal free fluid. No intraperitoneal free gas. No organized fluid collection. Musculoskeletal: Large supraumbilical ventral wall hernia containing the majority of the transverse colon as well as several loops of small bowel. There is an abdominal defect of 8.5 x 9.8 cm. No suspicious lytic or blastic osseous lesions. No acute displaced fracture. Multilevel degenerative changes of the spine. IMPRESSION: 1. Colonic diverticulosis with uncomplicated splenic flexure/proximal  descending colon diverticulitis. Recommend colonoscopy status post treatment and status post complete resolution of inflammatory changes to exclude an underlying lesion. 2. Indeterminate 1.4 cm right renal lesion. Recommend nonemergent MRI renal protocol for further evaluation. When the patient is clinically stable and able to follow directions and hold their breath (preferably as an outpatient) further evaluation with dedicated abdominal MRI should be considered. 3. Large supraumbilical ventral wall hernia containing the majority of the transverse colon as well as several loops of small bowel. No associated findings suggest ischemia or bowel obstruction. 4.  Aortic Atherosclerosis (ICD10-I70.0). Electronically Signed   By: Iven Finn M.D.   On: 06/30/2021 21:33   DG Chest Port 1 View  Result Date: 06/30/2021 CLINICAL DATA:  Epigastric abdominal pain EXAM: PORTABLE CHEST 1 VIEW COMPARISON:  12/02/2020 FINDINGS: Lungs are clear. No pneumothorax or pleural effusion. Cardiac size within normal limits. 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 Pulmonary vascularity is normal. No acute bone abnormality. IMPRESSION: No radiographic evidence of acute cardiopulmonary disease.  Electronically Signed   By: Fidela Salisbury M.D.   On: 06/30/2021 20:25    Procedures Procedures  Procedure note: Ultrasound Guided Peripheral IV Ultrasound guided peripheral 1.88 inch angiocath IV placement performed by me. Indications: Nursing unable to place IV. Details: The antecubital fossa and upper arm were evaluated with a multifrequency linear probe. Patent brachial veins were noted. 1 attempt was made to cannulate a vein under realtime US guidance with successful cannulation of the vein and catheter placement. There is return of non-pulsatile dark red blood. The patient tolerated the procedure well without complications. Images archived electronically.  CPT codes: [id number] and [phone]    Medications Ordered in ED Medications  fentaNYL (SUBLIMAZE) injection 50 mcg (50 mcg Intravenous Not Given 06/30/21 2036)  ondansetron [facility]) injection 4 mg (4 mg Intravenous Not Given 06/30/21 2036)  iohexol (OMNIPAQUE) 300 MG/ML solution 80 mL (80 mLs Intravenous Contrast Given 06/30/21 2114)    ED Course/ Medical Decision Making/ A&P                           Medical Decision Making Amount and/or Complexity of Data Reviewed Radiology: ordered. ECG/medicine tests: ordered.  Risk Prescription drug management.   Patient is a 76 y.o. female with a cc of Abdominal discomfort.  Going on for about 3 to 4 days now.  Seen in urgent care and then sent over here.  LFTs and lipase are unremarkable.  She does have a moderate leukocytosis of 15,000.  Will obtain a CT scan of the abdomen pelvis with contrast.  UA individually interpreted by me without infection.  CT scan of the abdomen pelvis with likely diverticulitis.  Patient also with a right renal lesion.  We will have her follow-up for this with her family doctor.  I offered antibiotics and pain medicine and the patient told me that she has too many allergies and will follow her typical regimen at home.  9:59 PM:  I have discussed the  diagnosis/risks/treatment options with the patient and family.  Evaluation and diagnostic testing in the emergency department does not suggest an emergent condition requiring admission or immediate intervention beyond what has been performed at this time.  They will follow up with  PCP, GI. We also discussed returning to the ED immediately if new or worsening sx occur. We discussed the sx which are most concerning (e.g., sudden worsening pain, fever, inability to tolerate by mouth) that necessitate immediate return. Medications  administered to the patient during their visit and any new prescriptions provided to the patient are listed below.  Medications given during this visit Medications  fentaNYL (SUBLIMAZE) injection 50 mcg (50 mcg Intravenous Not Given 06/30/21 2036)  ondansetron [facility]) injection 4 mg (4 mg Intravenous Not Given 06/30/21 2036)  iohexol (OMNIPAQUE) 300 MG/ML solution 80 mL (80 mLs Intravenous Contrast Given 06/30/21 2114)     The patient appears reasonably screen and/or stabilized for discharge and I doubt any other medical condition or other [facility] requiring further screening, evaluation, or treatment in the ED at this time prior to discharge.         Final Clinical Impression(s) / ED Diagnoses Final diagnoses:  Diverticulitis    Rx / DC Orders ED Discharge Orders     None         Deno Etienne, DO 06/30/21 2159

## 2021-06-30 NOTE — ED Notes (Signed)
Multiple attempts by CT techs to obtain IV bilaterally, unsuccessful.

## 2021-06-30 NOTE — Telephone Encounter (Signed)
Per chart review tab pt did go to [facility] and provider advised pt needed to go to ED. Per chart review tab pt went to [facility] ED. Sending note to Dr Glori Bickers, Dr Darnell Level as Juluis Rainier to PCP and lisa CMA.

## 2021-06-30 NOTE — ED Provider Notes (Signed)
Ashley [name]    CSN: [id number] Arrival date & time: 06/30/21  1145      History   Chief Complaint Chief Complaint  Patient presents with   Diarrhea   Abdominal Pain    HPI Ashley [name] is a 76 y.o. female.  Patient presents with 3-day history of generalized abdominal "soreness" and watery diarrhea.  She denies fever, chills, nausea, vomiting, or other symptoms.  No treatments at home.  Her medical history includes diverticulitis with perforation, diverticular abscess, colostomy and colostomy reversal, IBS, cardiomyopathy, hypertension, heart failure, implantable cardioverter defibrillator, chronic respiratory failure with hypoxia, CKD.  The history is provided by the patient and medical records.   Past Medical History:  Diagnosis Date   Abdominal aortic atherosclerosis ([place]) 11/2015   by CT   Allergy    Anemia    Anxiety    Automatic implantable cardioverter-defibrillator in situ    a. 2012 s/p MDT single lead ICD; b. 10/2018 BiV upgrade-->MDT ser # [id number] H.   Bronchiectasis    a. possibly mild, treat URIs aggressively   Cancer ([place])    basal cell, arms & neck   Chronic sinusitis    COPD (chronic obstructive pulmonary disease) ([place])    COVID-19 12/06/2020   Depression    Dyspnea    Essential hypertension    Pt denies   Family history of adverse reaction to anesthesia    Mother - PONV   Fibrocystic breast disease    Fibromyalgia    GERD (gastroesophageal reflux disease)    H/O multiple allergies 10/10/2013   Hemorrhoids    HFrEF (heart failure with reduced ejection fraction) ([place])    a. 06/2011 cMRI: EF 34%, no LGE; b. 01/2016 Echo: EF 35-40%; c. 07/2016 Echo: EF 40-45%; d. 02/2020 Echo: EF 20-25%, glob HK. GrI DD. Mildly red RV fxn. Mildly dil LA. Triv MR.   History of diverticulitis of colon    History of pneumonia 2012   Insomnia    Irritable bowel syndrome    Laryngeal nodule    Dr. Thomasena Edis   Migraine without aura, without mention of  intractable migraine without mention of status migrainosus    Mitral regurgitation    Neuropathy    feet and hands   Nonischemic cardiomyopathy ([place])    a. 2012 s/p single lead ICD; b. 06/2011 cMRI: EF 34%, no LGE; b. 01/2016 Echo: EF 35-40%; c. 07/2016 Echo: EF 40-45%; d. 10/2018 Cath: nl cors. EF 20-25%; e. 10/2018 s/p BiV MDT ICD upgrade; f. 02/2020 Echo: EF 20-25%, glob HK. GrI DD.   Osteoarthritis    knees, back, hands, low back,    Osteoporosis 01/2013   T-2.7 spine (01/2016)   PONV (postoperative nausea and vomiting)    sigmoid diverticulitis with perforation, abscess and fistula 09/30/2013   Sleep apnea 2010   borderline, inconclusive- /w [place]     Patient Active Problem List   Diagnosis Date Noted   Statin intolerance 04/16/2021   Hair loss 04/16/2021   Asthma 03/04/2021   Dizziness 02/07/2021   Nocturnal hypoxia 01/01/2021   UTI (urinary tract infection) 10/15/2020   Bilateral flank pain 10/15/2020   Chronic constipation 04/22/2020   Cervical pain (neck) 02/04/2019   Bilateral leg numbness 06/01/2018   Mixed hyperlipidemia 01/17/2018   Plantar fasciitis, bilateral 12/13/2017   Acquired renal cyst of right kidney 08/11/2017   Unsteadiness on feet 07/24/2017   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   High total serum IgM 11/22/2016   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[place]) 11/22/2016  Bleeding external hemorrhoids 10/31/2016   LBBB (left bundle branch block) 07/11/2016   Gastritis 06/12/2016   Fecal incontinence    Chronic generalized abdominal pain 11/22/2015   Abdominal aortic atherosclerosis ([place]) 11/13/2015   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   Sore throat 07/12/2015   Hoarseness 06/21/2015   Hypersensitivity to pneumococcal vaccine 06/10/2015   Medicare annual wellness visit, subsequent 06/08/2015   Advanced care planning/counseling discussion 06/08/2015   Intertrigo 04/06/2015   Nonintractable episodic headache 04/06/2015   Peripheral neuropathy 04/06/2015   S/P colostomy takedown  02/10/2014   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 10/23/2013   H/O multiple allergies 10/10/2013   History of diverticular abscess 09/25/2013   Osteoporosis 01/13/2013   Irregular heart beat 08/06/2012   MDD (major depressive disorder), recurrent episode, moderate (HCC) 07/09/2012   Chronic recurrent sinusitis 06/17/2012   Vitamin D deficiency 03/05/2012   Essential tremor 12/29/2011   GERD (gastroesophageal reflux disease) 11/13/2011   Allergic rhinitis 11/13/2011   CKD (chronic kidney disease) stage 3, GFR 30-59 ml/min (HCC) 11/09/2011   Chronic respiratory failure with hypoxia ([place]) 07/31/2011   Cardiomyopathy, dilated, nonischemic ([place]) 03/01/2011   Mitral regurgitation 38/75/6433   Chronic systolic CHF (congestive heart failure) ([place]) 02/24/2011   Chronic bronchitis ([place]) 01/20/2011   Syncope 11/02/2010   Chronic fatigue and malaise 11/02/2010   Anxiety state 08/18/2010   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   Irritable bowel syndrome 11/22/2006   FIBROCYSTIC BREAST DISEASE 11/22/2006   Osteoarthritis 11/22/2006   Fibromyalgia 11/22/2006    Past Surgical History:  Procedure Laterality Date   ABDOMINAL HYSTERECTOMY  1987   w/BSO   ANAL RECTAL MANOMETRY N/A 02/02/2016   Procedure: ANO RECTAL MANOMETRY;  Surgeon: Mauri Pole, MD;  Location: [facility];  Service: Endoscopy;  Laterality: N/A;   APPENDECTOMY     AUGMENTATION MAMMAPLASTY     BIV UPGRADE N/A 11/04/2018   Procedure: BIV ICD  UPGRADE;  Surgeon: Evans Lance, MD;  Location: [facility];  Service: Cardiovascular;  Laterality: N/A;   breast cystectomies     due to FCBD   BREAST MASS EXCISION  01/2010   Left-fibrocystic change w/intraductal papilloma, no malignancy   carotid US  29/5188   4-16% LICA, normal RICA   CATARACT EXTRACTION W/PHACO Right 03/08/2021   Procedure: CATARACT EXTRACTION PHACO AND INTRAOCULAR LENS PLACEMENT ([place]) RIGHT;  Surgeon: Birder Robson, MD;  Location: [facility];  Service: Ophthalmology;  Laterality: Right;  13.01 1:15.7   [place] Bunionectomy  11/04/08   Right Great Toe (Dr. Beola Cord)   COLONOSCOPY  01/2018   1 small benign polyp, diverticulosis, healthy anastomosis Ashley Ivory @ [facility])   COLOSTOMY N/A 10/02/2013   DESCENDING COLOSTOMY;  Surgeon: Imogene Burn. Lee Mont, MD   COLOSTOMY REVERSAL N/A 02/10/2014   [name], MD   EMG/MCV  10/16/01   + mild carpal tunnel   ESOPHAGOGASTRODUODENOSCOPY  09/2014   small HH, irregular Z line, fundic gland polyps with mild gastritis Olevia Perches)   Flex laryngoscopy  06/11/06   ([place]) nml   IMPLANTABLE CARDIOVERTER DEFIBRILLATOR IMPLANT N/A 07/14/2011   MDT single chamber ICD   KNEE ARTHROSCOPY W/ ORIF     Left; "meniscus tear"   LASIK     left eye   MANDIBLE SURGERY     "got about 6 pins in the bottom of my jaw"   NCS  11/2014   L ulnar neuropathy, mild B median nerve entrapment ([place])   OSTEOTOMY  Left foot   PARTIAL COLECTOMY N/A 10/02/2013   PARTIAL COLECTOMY;  Surgeon: Imogene Burn. Tsuei, MD   RIGHT/LEFT HEART CATH AND CORONARY ANGIOGRAPHY N/A 10/16/2018   Procedure: RIGHT/LEFT HEART CATH AND CORONARY ANGIOGRAPHY;  Surgeon: Wellington Hampshire, MD;  Location: [facility];  Service: Cardiovascular;  Laterality: N/A;   sinus surgery     x3 with balloon    TOE AMPUTATION     "toe beside baby toe on left foot; got gangrene from corn"   TONSILLECTOMY AND ADENOIDECTOMY      OB History   No obstetric history on file.      Home Medications    Prior to Admission medications   Medication Sig Start Date End Date Taking? Authorizing Provider  acetaminophen (TYLENOL) 500 MG tablet Take 500 mg by mouth daily as needed for moderate pain or headache (PAIN).     [provider]  AMBULATORY NON FORMULARY MEDICATION Medication Name:  1 squatty potty to use as needed 01/20/16   Mauri Pole, MD  Ascorbic Acid (VITAMIN C) 1000 MG tablet Take 1,000 mg by mouth  See admin instructions. Take 1000 mg daily, may take an additional 1000 mg as needed for cold symptoms    [provider]  aspirin EC 81 MG tablet Take 81 mg by mouth at bedtime.     [provider]  beclomethasone (QVAR) 80 MCG/ACT inhaler Inhale 2 puffs into the lungs 2 (two) times daily. 03/04/21   Parrett, Fonnie Mu, NP  benzonatate (TESSALON) 200 MG capsule Take 1 capsule (200 mg total) by mouth 3 (three) times daily as needed for cough. 12/31/20   [name], MD  Biotin 10 MG CAPS Take 1 tablet by mouth daily.    [provider]  carvedilol (COREG) 6.25 MG tablet Take 0.5 tablets (3.125 mg total) by mouth 2 (two) times daily with a meal. 01/05/21   Theora Gianotti, NP  Cholecalciferol (VITAMIN D3) 50 MCG (2000 UT) capsule Take 1 capsule (2,000 Units total) by mouth daily. 04/15/21   [name], MD  Coenzyme Q10 (COQ-10) 100 MG CAPS Take 100 mg by mouth daily.    [provider]  dextromethorphan (DELSYM) 30 MG/5ML liquid Take 15 mg by mouth daily as needed for cough.     [provider]  ezetimibe (ZETIA) 10 MG tablet TAKE 1 TABLET BY MOUTH EVERY DAY 05/03/21   Minna Merritts, MD  fluorouracil (EFUDEX) 5 % cream Apply topically. 06/05/20   [provider]  GARLIC PO Take 1 capsule by mouth daily.    [provider]  guaiFENesin (MUCINEX) 600 MG 12 hr tablet Take 600 mg by mouth 2 (two) times daily.    [provider]  levalbuterol Penne Lash HFA) 45 MCG/ACT inhaler Inhale 2 puffs into the lungs every 6 (six) hours as needed for wheezing or shortness of breath. 03/04/21   Parrett, Fonnie Mu, NP  LORazepam (ATIVAN) 0.5 MG tablet Take 0.5-1 tablets (0.25-0.5 mg total) by mouth at bedtime as needed for anxiety or sleep. 03/22/20   [name], MD  magnesium 30 MG tablet Take 30 mg by mouth daily.    [provider]  Menthol, Topical Analgesic, (BIOFREEZE ROLL-ON EX) Apply topically.    [provider]  miconazole (MICOTIN) 2 % powder Apply 1 application topically as needed (yeast).    [provider]  nystatin cream (MYCOSTATIN) Apply 1 application topically as needed for dry skin (yeast infection). 04/15/21  [name], MD  Omega-3 Fatty Acids (FISH OIL) 1000 MG CAPS Take 1 capsule (1,000 mg total) by mouth daily. 12/04/20   Patrecia Pour, MD  OXYGEN Inhale into the lungs as needed.    [provider]  pantoprazole (PROTONIX) 40 MG tablet Take 40 mg by mouth every Monday, Wednesday, and Friday. 08/11/20   [name], MD  potassium chloride (KLOR-CON) 10 MEQ tablet Take 1 tablet (10 mEq total) by mouth 3 (three) times a week. 01/05/21   Theora Gianotti, NP  pyridOXINE (VITAMIN B-6) 100 MG tablet Take 100 mg by mouth daily.    [provider]  Red Yeast Rice Extract (RED YEAST RICE PO) Take 1 capsule by mouth in the morning and at bedtime.    [provider]  sacubitril-valsartan (ENTRESTO) 49-51 MG Take 1 tablet by mouth daily. 05/26/21   Baldwin Jamaica, PA-C  spironolactone (ALDACTONE) 25 MG tablet Take 1 tablet (25 mg total) by mouth daily. 01/05/21   Theora Gianotti, NP  torsemide (DEMADEX) 10 MG tablet TAKE 1 TABLET BY MOUTH 3 TIMES A WEEK. 06/13/21   Theora Gianotti, NP  triamcinolone (NASACORT) 55 MCG/ACT AERO nasal inhaler Place into the nose.    [provider]  vitamin E 400 UNIT capsule Take 400 Units by mouth daily.    [provider]  bisoprolol (ZEBETA) 5 MG tablet Take 5 mg by mouth daily. 06/06/11 07/02/18  Minna Merritts, MD    Family History Family History  Problem Relation Age of Onset   Lung cancer Father        + smoker   Colon polyps Father    Dementia Mother    Osteoporosis Mother        Lumbar spine   Stroke Mother        x 5 @ 32 YOA   Arthritis Mother        hands   Emphysema Mother    Alcohol abuse Paternal Grandfather    Stroke Paternal  Grandfather        ?   Heart disease Paternal Grandfather    Hypertension Maternal Grandfather        ?   Heart disease Paternal Grandmother    Colon cancer Neg Hx    Esophageal cancer Neg Hx    Gallbladder disease Neg Hx     Social History Social History   Tobacco Use   Smoking status: Never   Smokeless tobacco: Never   Tobacco comments:    Lived with smokers x 23 yrs, smoked herself "for a week"  Vaping Use   Vaping Use: Never used  Substance Use Topics   Alcohol use: No    Alcohol/week: 0.0 standard drinks   Drug use: No     Allergies   Atorvastatin; Chlorhexidine; Codeine; Cyclobenzaprine; Cymbalta [duloxetine hcl]; Diflucan [fluconazole]; Dilaudid [hydromorphone hcl]; Furosemide; Influenza vac split [influenza virus vaccine]; Latex; Morphine and related; Pravastatin; Prevacid [lansoprazole]; Prevnar [pneumococcal 13-val conj vacc]; Rosuvastatin; Simvastatin; Tape; Farxiga [dapagliflozin]; Metaxalone; Tramadol; Antihistamines, chlorpheniramine-type; Betadine [povidone iodine]; Onion; Other; Penicillins; Red dye; Sertraline; and Sulfasalazine   Review of Systems Review of Systems  Constitutional:  Negative for chills and fever.  Respiratory:  Negative for cough and shortness of breath.   Cardiovascular:  Negative for chest pain and palpitations.  Gastrointestinal:  Positive for abdominal pain and diarrhea. Negative for blood in stool, nausea and vomiting.  Genitourinary:  Negative for dysuria and hematuria.  All other systems reviewed and  are negative.   Physical Exam Triage Vital Signs ED Triage Vitals  Enc Vitals Group     BP      Pulse      Resp      Temp      Temp src      SpO2      Weight      Height      Head Circumference      Peak Flow      Pain Score      Pain Loc      Pain Edu?      Excl. in [place]?    No data found.  Updated Vital Signs BP 127/72    Pulse (!) 106    Temp 97.9 F (36.6 C)    Resp 20    SpO2 96%   Visual Acuity Right Eye  Distance:   Left Eye Distance:   Bilateral Distance:    Right Eye Near:   Left Eye Near:    Bilateral Near:     Physical Exam Vitals and nursing note reviewed.  Constitutional:      General: She is not in acute distress.    Appearance: She is well-developed. She is obese.  HENT:     Mouth/Throat:     Mouth: Mucous membranes are moist.  Cardiovascular:     Rate and Rhythm: Normal rate and regular rhythm.     Heart sounds: Normal heart sounds.  Pulmonary:     Effort: Pulmonary effort is normal. No respiratory distress.     Breath sounds: Normal breath sounds.  Abdominal:     General: Bowel sounds are normal.     Palpations: Abdomen is soft.     Tenderness: There is generalized abdominal tenderness. There is no guarding or rebound.     Comments: Mild generalized tenderness to palpation of abdomen. No rebound or guarding.   Musculoskeletal:     Cervical back: Neck supple.  Skin:    General: Skin is warm and dry.  Neurological:     Mental Status: She is alert.  Psychiatric:        Mood and Affect: Mood normal.        Behavior: Behavior normal.     UC Treatments / Results  Labs (all labs ordered are listed, but only abnormal results are displayed) Labs Reviewed - No data to display  EKG   Radiology No results found.  Procedures Procedures (including critical care time)  Medications Ordered in UC Medications - No data to display  Initial Impression / Assessment and Plan / UC Course  I have reviewed the triage vital signs and the nursing notes.  Pertinent labs & imaging results that were available during my care of the patient were reviewed by me and considered in my medical decision making (see chart for details).  Generalized abdominal pain, diarrhea, history of diverticulosis.  Patient has history of diverticular abscess and colostomy with reversal.  Her abdomen is mildly tender to palpation.  She reports multiple episodes of watery diarrhea x 3 days.  Given  her history and exam, sending her to the ED for evaluation.  She prefers to go to [facility] ED and will drive herself.  She states she is stable for self transport POV and declines EMS.    Final Clinical Impressions(s) / UC Diagnoses   Final diagnoses:  Generalized abdominal pain  Diarrhea, unspecified type  History of diverticulosis     Discharge Instructions  Go to the emergency department for evaluation of your abdominal pain and diarrhea.      ED Prescriptions   None    PDMP not reviewed this encounter.   Sharion Balloon, NP 06/30/21 1257

## 2021-06-30 NOTE — ED Triage Notes (Signed)
Pt here with abdominal pain and diarrhea x 3 days. Pt has diverticulitis hx with portion of bowel removed from abscess in colon during the last exacerbation. Was told by PCP to come here.

## 2021-06-30 NOTE — ED Provider Triage Note (Signed)
Emergency Medicine Provider Triage Evaluation Note  Ashley [name] , a 76 y.o. female  was evaluated in triage.  Pt complains of abdominal pain.  Describes the pain as soreness located throughout her abdomen.  She states that she first noticed it 3 days ago.  She also states she has had several bouts of watery diarrhea since then.  Of note, patient with history of diverticulitis with perforation in 2017 with partial colectomy.  Review of Systems  Positive: Diarrhea, abdominal pain Negative: Fever, chills, nausea, vomiting, hematochezia, melena  Physical Exam  BP (!) 118/94 (BP Location: Left Arm)    Pulse 100    Temp 98.5 F (36.9 C) (Oral)    Resp 19    SpO2 97%  Gen:   Awake, no distress   Resp:  Normal effort  MSK:   Moves extremities without difficulty  Other:    Medical Decision Making  Medically screening exam initiated at 2:33 PM.  Appropriate orders placed.  KYNSLEY [facility] was informed that the remainder of the evaluation will be completed by another provider, this initial triage assessment does not replace that evaluation, and the importance of remaining in the ED until their evaluation is complete.     Bud Face, PA-C 06/30/21 1435

## 2021-06-30 NOTE — Telephone Encounter (Signed)
[name] - Client TELEPHONE ADVICE RECORD [facility] Patient Name: Ashley [name] Gender: Female DOB: February 02, 1946 Age: 76 Y 11 M 10 D Return Phone Number: [phone] (Primary) Address: City/ State/ Zip: [place]  [address] Client [name] - Client Client Site [place] Provider [name] - MD Contact Type Call Who Is Calling Patient / Member / Family / Caregiver Call Type Triage / Clinical Relationship To Patient Self Return Phone Number [phone] (Primary) Chief Complaint Unclassified Symptom Reason for Call Symptomatic / Request for [place] states she has been experiencing mid section soreness and diarrhea. Caller states the soreness of below her breast and above her pelvic area. Translation No Nurse Assessment Nurse: Ronnald Ramp, RN, Miranda Date/Time (Eastern Time): 06/30/2021 11:06:15 AM Confirm and document reason for call. If symptomatic, describe symptoms. ---Caller states she is having diarrhea and having abdominal soreness. Does the patient have any new or worsening symptoms? ---Yes Will a triage be completed? ---Yes Related visit to physician within the last 2 weeks? ---No Does the PT have any chronic conditions? (i.e. diabetes, asthma, this includes High risk factors for pregnancy, etc.) ---Yes List chronic conditions. ---IBS, hx diverticulitis, hx of colon surgery, CHF, Arthritis, Fibromyalgia Is this a behavioral health or substance abuse call? ---No Guidelines Guideline Title Affirmed Question Affirmed Notes Nurse Date/Time (Eastern Time) Diarrhea [1] Constant abdominal pain AND [2] present > 2 hours Ronnald Ramp, RN, Miranda 06/30/2021 11:09:54 AM Disp. Time [name] Time) Disposition Final User 06/30/2021 11:13:18 AM See HCP within 4 Hours (or PCP triage) Yes Ronnald Ramp, RN, Miranda PLEASE NOTE: All timestamps  contained within this report are represented as [place] Standard Time. CONFIDENTIALTY NOTICE: This fax transmission is intended only for the addressee. It contains information that is legally privileged, confidential or otherwise protected from use or disclosure. If you are not the intended recipient, you are strictly prohibited from reviewing, disclosing, copying using or disseminating any of this information or taking any action in reliance on or regarding this information. If you have received this fax in error, please notify us immediately by telephone so that we can arrange for its return to [place]. Phone: [phone], Toll-Free: [phone], Fax: [phone] Page: 2 of 2 Call Id: [id number] [place] Disagree/Comply Comply Caller Understands Yes PreDisposition Call Doctor Care Advice Given Per Guideline SEE HCP (OR PCP TRIAGE) WITHIN 4 HOURS: * IF OFFICE WILL BE OPEN: You need to be seen within the next 3 or 4 hours. Call your doctor (or NP/PA) now or as soon as the office opens. CALL BACK IF: * You become worse CARE ADVICE given per Diarrhea (Adult) guideline. Comments User: Leverne Humbles, RN Date/Time [name] Time): 06/30/2021 11:24:27 AM Caller stated that she has already been told there are no appts available for today. Suggested that she contact her GI doctor. IF she is not able to get an appt with her GI doctor she should go to [facility] or ED. She refused UC/ED. She stated if she cannot get an appt with her GI, she would like the earliest appt with PCP office next week. RN tried to reinforce triage outcome but caller interrupted and stated she knows what RN is going to say but she is not going to the ED. Told caller she would have to contact the office directly to check about appts for next week. After disconnecting with caller, I did call backline and speak with Velva Harman and verified there are no  appt available for today. Referrals GO TO FACILITY REFUSED GO TO FACILITY UNDECIDE

## 2021-06-30 NOTE — Discharge Instructions (Addendum)
Go to the emergency department for evaluation of your abdominal pain and diarrhea.

## 2021-06-30 NOTE — Discharge Instructions (Signed)
Please return for worsening pain or if you develop a fever.

## 2021-06-30 NOTE — ED Triage Notes (Signed)
Pt sent from UC for CT scan and blood work to eval abdominal pain and diarrhea since Monday. Denies n/v. Hx diverticulitis/diverticulosis with abscess and colectomy in 2015.

## 2021-06-30 NOTE — Telephone Encounter (Signed)
Glad she is going to UC  Will watch for corresp

## 2021-07-06 ENCOUNTER — Other Ambulatory Visit (HOSPITAL_COMMUNITY): Payer: Self-pay

## 2021-07-06 NOTE — Telephone Encounter (Signed)
Seen at ER, dx with diverticulitis, was not given abx.  Plz call to f/u on pt status.  Will also need ER f/u visit.

## 2021-07-06 NOTE — Telephone Encounter (Signed)
Attempted to call pt.  No answer.  Vm box is full.  Need to get update on pt and schedule OV for ER f/u.

## 2021-07-07 ENCOUNTER — Other Ambulatory Visit (HOSPITAL_COMMUNITY): Payer: Self-pay

## 2021-07-07 NOTE — Telephone Encounter (Signed)
Looks like pt is schedule for ER f/u on 08/03/21 at 12:00.  Plz see if pt can r/s to  2/27 or 2/28 at 12:30?

## 2021-07-08 NOTE — Telephone Encounter (Signed)
LMTCB to schedule appt

## 2021-07-12 ENCOUNTER — Encounter: Payer: Self-pay | Admitting: Family Medicine

## 2021-07-12 ENCOUNTER — Other Ambulatory Visit: Payer: Self-pay

## 2021-07-12 ENCOUNTER — Ambulatory Visit (INDEPENDENT_AMBULATORY_CARE_PROVIDER_SITE_OTHER): Payer: Medicare Other | Admitting: Family Medicine

## 2021-07-12 VITALS — BP 128/64 | HR 75 | Temp 98.0°F | Ht 64.5 in | Wt 186.0 lb

## 2021-07-12 DIAGNOSIS — R159 Full incontinence of feces: Secondary | ICD-10-CM

## 2021-07-12 DIAGNOSIS — Z95810 Presence of automatic (implantable) cardiac defibrillator: Secondary | ICD-10-CM

## 2021-07-12 DIAGNOSIS — K5732 Diverticulitis of large intestine without perforation or abscess without bleeding: Secondary | ICD-10-CM | POA: Diagnosis not present

## 2021-07-12 DIAGNOSIS — Z8719 Personal history of other diseases of the digestive system: Secondary | ICD-10-CM | POA: Diagnosis not present

## 2021-07-12 DIAGNOSIS — K219 Gastro-esophageal reflux disease without esophagitis: Secondary | ICD-10-CM

## 2021-07-12 DIAGNOSIS — N281 Cyst of kidney, acquired: Secondary | ICD-10-CM

## 2021-07-12 DIAGNOSIS — I5022 Chronic systolic (congestive) heart failure: Secondary | ICD-10-CM | POA: Diagnosis not present

## 2021-07-12 LAB — CBC WITH DIFFERENTIAL/PLATELET
Basophils Absolute: 0.1 10*3/uL (ref 0.0–0.1)
Basophils Relative: 0.8 % (ref 0.0–3.0)
Eosinophils Absolute: 0.1 10*3/uL (ref 0.0–0.7)
Eosinophils Relative: 1.1 % (ref 0.0–5.0)
HCT: 39.4 % (ref 36.0–46.0)
Hemoglobin: 12.6 g/dL (ref 12.0–15.0)
Lymphocytes Relative: 19.5 % (ref 12.0–46.0)
Lymphs Abs: 2.2 10*3/uL (ref 0.7–4.0)
MCHC: 32 g/dL (ref 30.0–36.0)
MCV: 90.3 fl (ref 78.0–100.0)
Monocytes Absolute: 0.9 10*3/uL (ref 0.1–1.0)
Monocytes Relative: 7.6 % (ref 3.0–12.0)
Neutro Abs: 8.1 10*3/uL — ABNORMAL HIGH (ref 1.4–7.7)
Neutrophils Relative %: 71 % (ref 43.0–77.0)
Platelets: 262 10*3/uL (ref 150.0–400.0)
RBC: 4.37 Mil/uL (ref 3.87–5.11)
RDW: 13.4 % (ref 11.5–15.5)
WBC: 11.4 10*3/uL — ABNORMAL HIGH (ref 4.0–10.5)

## 2021-07-12 NOTE — Assessment & Plan Note (Signed)
Continues PPI MWF

## 2021-07-12 NOTE — Assessment & Plan Note (Signed)
Closely followed by Dr [name] cardiology.

## 2021-07-12 NOTE — Assessment & Plan Note (Signed)
Did not tolerate PFPT trial early 2022.

## 2021-07-12 NOTE — Addendum Note (Signed)
Addended by: [name] on: 07/12/2021 01:59 PM   Modules accepted: Orders

## 2021-07-12 NOTE — Telephone Encounter (Signed)
[employer] contacted me to verify diagnosis code of cardiomyopathy. Patient may use [employer] foundation effective 06/15/21. Per representative patient already started using the fund.

## 2021-07-12 NOTE — Patient Instructions (Addendum)
Repeat CBC today  We will refer you back to Dr [name] at [facility].  I'm glad you're doing better.  We will be in touch with plan for spot on right kidney.

## 2021-07-12 NOTE — Assessment & Plan Note (Addendum)
Confirmed on CT scan at ER earlier this month. WBC 15 at that time. She did not take antibiotics but instead treated with diet change at home (bland diet) and is feeling better today. Given extensive GI history (h/o diverticulitis with abscess formation/perforation s/p partial colectomy and colostomy s/p subsequent takedown), discussed low threshold to use abx when needed. I will repeat WBC count and if remaining elevated, consider abx course.  Last saw GI 04/2020 - will refer back to Dr [name].

## 2021-07-12 NOTE — Progress Notes (Addendum)
Patient ID: Ashley [name], female    DOB: [birth year], 76 y.o.   MRN: [id number]  This visit was conducted in person.  BP 128/64    Pulse 75    Temp 98 F (36.7 C) (Temporal)    Ht 5' 4.5" (1.638 m)    Wt 186 lb (84.4 kg)    SpO2 95%    BMI 31.43 kg/m    CC: ER f/u visit  Subjective:   HPI: Ashley [name] is a 76 y.o. female presenting on 07/12/2021 for Hospitalization Follow-up (Seen on 06/30/21 at [facility], dx generalized abd pain; dirrhea.  Then sent to [facility] ED, dx diverticulitis.)   Recent ER evaluation for 3-4 days of abdominal soreness associated with diarrhea (3-4 episodes per night). Initially seen at [facility] - sent to ER. Work up included leukocytosis with WBC 15.8, CT scan showing diverticulosis with uncomplicated diverticulitis of splenic flexure/proximal descending colon. Recommend colonoscopy after treatment. CT also showed large supraumbilical ventral wall hernia containing small bowel and majority of transverse colon. There was also incidentally found 1.4cm R renal lesion - rec nonemergent MR renal protocol for further evaluation.   Needed US guided peripheral IV placement. She did not take antibiotics.   Since home, soreness and diarrhea are significantly better. She did do bland diet for a few days.   Last abd/pelvic CT scan done 08/2019. At that time she had L renal upper pole cyst and R renal lower pole cysts.   GI - has seen Dr [name] at [place] [facility] ([place]). Latest had difficulty tolerating PFPT. Last seen 04/2020.      Relevant past medical, surgical, family and social history reviewed and updated as indicated. Interim medical history since our last visit reviewed. Allergies and medications reviewed and updated. Outpatient Medications Prior to Visit  Medication Sig Dispense Refill   acetaminophen (TYLENOL) 500 MG tablet Take 500 mg by mouth daily as needed for moderate pain or headache (PAIN).      AMBULATORY NON FORMULARY MEDICATION Medication Name:  1  squatty potty to use as needed 1 each 0   Ascorbic Acid (VITAMIN C) 1000 MG tablet Take 1,000 mg by mouth See admin instructions. Take 1000 mg daily, may take an additional 1000 mg as needed for cold symptoms     aspirin EC 81 MG tablet Take 81 mg by mouth at bedtime.      beclomethasone (QVAR) 80 MCG/ACT inhaler Inhale 2 puffs into the lungs 2 (two) times daily. 1 each 5   benzonatate (TESSALON) 200 MG capsule Take 1 capsule (200 mg total) by mouth 3 (three) times daily as needed for cough. 30 capsule 6   Biotin 10 MG CAPS Take 1 tablet by mouth daily.     carvedilol (COREG) 6.25 MG tablet Take 0.5 tablets (3.125 mg total) by mouth 2 (two) times daily with a meal. 90 tablet 1   Cholecalciferol (VITAMIN D3) 50 MCG (2000 UT) capsule Take 1 capsule (2,000 Units total) by mouth daily.     Coenzyme Q10 (COQ-10) 100 MG CAPS Take 100 mg by mouth daily.     COLLAGEN PO Take by mouth daily.     dextromethorphan (DELSYM) 30 MG/5ML liquid Take 15 mg by mouth daily as needed for cough.      ezetimibe (ZETIA) 10 MG tablet TAKE 1 TABLET BY MOUTH EVERY DAY 90 tablet 3   fluorouracil (EFUDEX) 5 % cream Apply topically.     GARLIC PO Take 1 capsule by  mouth daily.     guaiFENesin (MUCINEX) 600 MG 12 hr tablet Take 600 mg by mouth 2 (two) times daily.     levalbuterol (XOPENEX HFA) 45 MCG/ACT inhaler Inhale 2 puffs into the lungs every 6 (six) hours as needed for wheezing or shortness of breath. 15 g 3   LORazepam (ATIVAN) 0.5 MG tablet Take 0.5-1 tablets (0.25-0.5 mg total) by mouth at bedtime as needed for anxiety or sleep. 30 tablet 1   magnesium 30 MG tablet Take 30 mg by mouth daily.     Menthol, Topical Analgesic, (BIOFREEZE ROLL-ON EX) Apply topically.     miconazole (MICOTIN) 2 % powder Apply 1 application topically as needed (yeast).     nystatin cream (MYCOSTATIN) Apply 1 application topically as needed for dry skin (yeast infection). 30 g 3   Omega-3 Fatty Acids (FISH OIL) 1000 MG CAPS Take 1 capsule  (1,000 mg total) by mouth daily.  0   OXYGEN Inhale into the lungs as needed.     pantoprazole (PROTONIX) 40 MG tablet Take 40 mg by mouth every Monday, Wednesday, and Friday.     potassium chloride (KLOR-CON) 10 MEQ tablet Take 1 tablet (10 mEq total) by mouth 3 (three) times a week. 40 tablet 1   pyridOXINE (VITAMIN B-6) 100 MG tablet Take 100 mg by mouth daily.     Red Yeast Rice Extract (RED YEAST RICE PO) Take 1 capsule by mouth in the morning and at bedtime.     sacubitril-valsartan (ENTRESTO) 49-51 MG Take 1 tablet by mouth daily. 30 tablet 6   spironolactone (ALDACTONE) 25 MG tablet Take 1 tablet (25 mg total) by mouth daily. 90 tablet 1   torsemide (DEMADEX) 10 MG tablet TAKE 1 TABLET BY MOUTH 3 TIMES A WEEK. 40 tablet 0   triamcinolone (NASACORT) 55 MCG/ACT AERO nasal inhaler Place into the nose.     vitamin E 400 UNIT capsule Take 400 Units by mouth daily.     No facility-administered medications prior to visit.     Per HPI unless specifically indicated in ROS section below Review of Systems  Objective:  BP 128/64    Pulse 75    Temp 98 F (36.7 C) (Temporal)    Ht 5' 4.5" (1.638 m)    Wt 186 lb (84.4 kg)    SpO2 95%    BMI 31.43 kg/m   Wt Readings from Last 3 Encounters:  07/12/21 186 lb (84.4 kg)  06/06/21 185 lb 12.8 oz (84.3 kg)  05/26/21 185 lb 12.8 oz (84.3 kg)      Physical Exam Vitals and nursing note reviewed.  Constitutional:      Appearance: Normal appearance. She is not ill-appearing.  Eyes:     Extraocular Movements: Extraocular movements intact.     Pupils: Pupils are equal, round, and reactive to light.  Cardiovascular:     Rate and Rhythm: Normal rate and regular rhythm.     Pulses: Normal pulses.     Heart sounds: Normal heart sounds. No murmur heard. Pulmonary:     Effort: Pulmonary effort is normal. No respiratory distress.     Breath sounds: Normal breath sounds. No wheezing, rhonchi or rales.  Abdominal:     General: Bowel sounds are normal.  There is no distension.     Palpations: Abdomen is soft. There is no mass.     Tenderness: There is generalized abdominal tenderness (mild). There is no right CVA tenderness, left CVA tenderness, guarding or rebound. Negative  signs include Murphy's sign.     Hernia: A hernia is present.  Musculoskeletal:        General: Normal range of motion.     Right lower leg: No edema.     Left lower leg: No edema.  Skin:    General: Skin is warm and dry.     Findings: No rash.  Neurological:     Mental Status: She is alert.  Psychiatric:        Mood and Affect: Mood normal.        Behavior: Behavior normal.      Results for orders placed or performed during the hospital encounter of 06/30/21  Comprehensive metabolic panel  Result Value Ref Range   Sodium 138 135 - 145 mmol/L   Potassium 4.0 3.5 - 5.1 mmol/L   Chloride 106 98 - 111 mmol/L   CO2 21 (L) 22 - 32 mmol/L   Glucose, Bld 93 70 - 99 mg/dL   BUN 19 8 - 23 mg/dL   Creatinine, Ser 0.84 0.44 - 1.00 mg/dL   Calcium 9.5 8.9 - 10.3 mg/dL   Total Protein 7.4 6.5 - 8.1 g/dL   Albumin 3.7 3.5 - 5.0 g/dL   AST 26 15 - 41 U/L   ALT 32 0 - 44 U/L   Alkaline Phosphatase 102 38 - 126 U/L   Total Bilirubin 0.4 0.3 - 1.2 mg/dL   GFR, Estimated >60 >60 mL/min   Anion gap 11 5 - 15  Lipase, blood  Result Value Ref Range   Lipase 36 11 - 51 U/L  CBC with Diff  Result Value Ref Range   WBC 15.8 (H) 4.0 - 10.5 K/uL   RBC 4.19 3.87 - 5.11 MIL/uL   Hemoglobin 12.6 12.0 - 15.0 g/dL   HCT 39.2 36.0 - 46.0 %   MCV 93.6 80.0 - 100.0 fL   MCH 30.1 26.0 - 34.0 pg   MCHC 32.1 30.0 - 36.0 g/dL   RDW 13.1 11.5 - 15.5 %   Platelets 191 150 - 400 K/uL   nRBC 0.0 0.0 - 0.2 %   Neutrophils Relative % 78 %   Neutro Abs 12.3 (H) 1.7 - 7.7 K/uL   Lymphocytes Relative 11 %   Lymphs Abs 1.7 0.7 - 4.0 K/uL   Monocytes Relative 10 %   Monocytes Absolute 1.5 (H) 0.1 - 1.0 K/uL   Eosinophils Relative 0 %   Eosinophils Absolute 0.1 0.0 - 0.5 K/uL    Basophils Relative 0 %   Basophils Absolute 0.0 0.0 - 0.1 K/uL   Immature Granulocytes 1 %   Abs Immature Granulocytes 0.09 (H) 0.00 - 0.07 K/uL  Urinalysis, Routine w reflex microscopic  Result Value Ref Range   Color, Urine YELLOW YELLOW   APPearance CLEAR CLEAR   Specific Gravity, Urine 1.021 1.005 - 1.030   pH 5.0 5.0 - 8.0   Glucose, UA NEGATIVE NEGATIVE mg/dL   Hgb urine dipstick NEGATIVE NEGATIVE   Bilirubin Urine NEGATIVE NEGATIVE   Ketones, ur NEGATIVE NEGATIVE mg/dL   Protein, ur NEGATIVE NEGATIVE mg/dL   Nitrite NEGATIVE NEGATIVE   Leukocytes,Ua TRACE (A) NEGATIVE   RBC / HPF 0-5 0 - 5 RBC/hpf   WBC, UA 0-5 0 - 5 WBC/hpf   Bacteria, UA RARE (A) NONE SEEN   Mucus PRESENT    *Note: Due to a large number of results and/or encounters for the requested time period, some results have not been displayed. A complete set  of results can be found in Results Review.   CT ABDOMEN PELVIS W CONTRAST CLINICAL DATA:  Abdominal pain, acute, nonlocalized  EXAM: CT ABDOMEN AND PELVIS WITH CONTRAST  TECHNIQUE: Multidetector CT imaging of the abdomen and pelvis was performed using the standard protocol following bolus administration of intravenous contrast.  RADIATION DOSE REDUCTION: This exam was performed according to the departmental dose-optimization program which includes automated exposure control, adjustment of the mA and/or kV according to patient size and/or use of iterative reconstruction technique.  CONTRAST:  47mL OMNIPAQUE IOHEXOL 300 MG/ML  SOLN  COMPARISON:  CT abdomen pelvis 09/01/2019, CT abdomen pelvis 06/18/2017  FINDINGS: Lower chest: Bilateral lower lobe subsegmental atelectasis. Partially visualized cardiac leads. No acute abnormality. Tiny hiatal hernia.  Hepatobiliary: No focal liver abnormality. No gallstones, gallbladder wall thickening, or pericholecystic fluid. No biliary dilatation.  Pancreas: No focal lesion. Normal pancreatic contour. No  surrounding inflammatory changes. No main pancreatic ductal dilatation.  Spleen: Normal in size without focal abnormality.  Adrenals/Urinary Tract:  No adrenal nodule bilaterally.  Bilateral kidneys enhance symmetrically. Bilateral renal cortical scarring. Subcentimeter hypodensities are too small to characterize. There is a 1.3 cm fluid density lesion within left kidney that likely represents a simple renal cyst. Similar pericentimeter findings within the right kidney. There is a 1.4 cm hypodense lesion within the right kidney with a density of 47 Hounsfield units.  No hydronephrosis. No hydroureter.  The urinary bladder is unremarkable.  Stomach/Bowel: Rectosigmoid surgical changes noted. Stomach is within normal limits. Grossly similar-appearing chronic second portion of the duodenum diverticula. No evidence of small bowel wall thickening or dilatation. Colonic diverticulosis. Associated bowel wall thickening and pericolonic fat stranding of the splenic flexure/proximal descending colon. Status post appendectomy.  Vascular/Lymphatic: No abdominal aorta or iliac aneurysm. Mild atherosclerotic plaque of the aorta and its branches. No abdominal, pelvic, or inguinal lymphadenopathy.  Reproductive: Status post hysterectomy. No adnexal masses.  Other: No intraperitoneal free fluid. No intraperitoneal free gas. No organized fluid collection.  Musculoskeletal:  Large supraumbilical ventral wall hernia containing the majority of the transverse colon as well as several loops of small bowel. There is an abdominal defect of 8.5 x 9.8 cm.  No suspicious lytic or blastic osseous lesions. No acute displaced fracture. Multilevel degenerative changes of the spine.  IMPRESSION: 1. Colonic diverticulosis with uncomplicated splenic flexure/proximal descending colon diverticulitis. Recommend colonoscopy status post treatment and status post complete resolution of inflammatory changes  to exclude an underlying lesion. 2. Indeterminate 1.4 cm right renal lesion. Recommend nonemergent MRI renal protocol for further evaluation. When the patient is clinically stable and able to follow directions and hold their breath (preferably as an outpatient) further evaluation with dedicated abdominal MRI should be considered. 3. Large supraumbilical ventral wall hernia containing the majority of the transverse colon as well as several loops of small bowel. No associated findings suggest ischemia or bowel obstruction. 4.  Aortic Atherosclerosis (ICD10-I70.0).  Electronically Signed   By: Iven Finn M.D.   On: 06/30/2021 21:33 DG Chest Port 1 View CLINICAL DATA:  Epigastric abdominal pain  EXAM: PORTABLE CHEST 1 VIEW  COMPARISON:  12/02/2020  FINDINGS: Lungs are clear. No pneumothorax or pleural effusion. Cardiac size within normal limits. 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 Pulmonary vascularity is normal. No acute bone abnormality.  IMPRESSION: No radiographic evidence of acute cardiopulmonary disease.  Electronically Signed   By: Fidela Salisbury M.D.   On: 06/30/2021 20:25   Assessment & Plan:  This visit occurred during the  SARS-CoV-2 public health emergency.  Safety protocols were in place, including screening questions prior to the visit, additional usage of staff PPE, and extensive cleaning of exam room while observing appropriate contact time as indicated for disinfecting solutions.   Problem List Items Addressed This Visit     Chronic systolic CHF (congestive heart failure) ([place])    Closely followed by Dr [name] cardiology.       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       Relevant Orders   Ambulatory referral to Gastroenterology   GERD  (gastroesophageal reflux disease)    Continues PPI MWF      Acquired renal cyst of right kidney    Recent CT abd/pelvis showing 1.4cm R renal lesion - recommended renal MRI for further evaluation however MRI is limited by pacemaker /defibrillator status. I touched base with radiology - recommend CT abd with/without contrast in 6 months.       Diverticulitis of large intestine without perforation or abscess without bleeding - Primary    Confirmed on CT scan at ER earlier this month. WBC 15 at that time. She did not take antibiotics but instead treated with diet change at home (bland diet) and is feeling better today. Given extensive GI history (h/o diverticulitis with abscess formation/perforation s/p partial colectomy and colostomy s/p subsequent takedown), discussed low threshold to use abx when needed. I will repeat WBC count and if remaining elevated, consider abx course.  Last saw GI 04/2020 - will refer back to Dr [name].       Relevant Orders   CBC with Differential/Platelet   Ambulatory referral to Gastroenterology     No orders of the defined types were placed in this encounter.  Orders Placed This Encounter  Procedures   CBC with Differential/Platelet   Ambulatory referral to Gastroenterology    Referral Priority:   Routine    Referral Type:   Consultation    Referral Reason:   Specialty Services Required    Number of Visits Requested:   1    Patient Instructions  Repeat CBC today  We will refer you back to Dr [name] at [facility].  I'm glad you're doing better.  We will be in touch with plan for spot on right kidney.   Follow up plan: Return if symptoms worsen or fail to improve.  [name], MD

## 2021-07-12 NOTE — Assessment & Plan Note (Addendum)
Recent CT abd/pelvis showing 1.4cm R renal lesion - recommended renal MRI for further evaluation however MRI is limited by pacemaker /defibrillator status. I touched base with radiology - recommend CT abd with/without contrast in 6 months.

## 2021-07-19 ENCOUNTER — Encounter: Payer: Self-pay | Admitting: *Deleted

## 2021-07-22 ENCOUNTER — Other Ambulatory Visit: Payer: Self-pay | Admitting: Nurse Practitioner

## 2021-07-25 ENCOUNTER — Telehealth: Payer: Self-pay | Admitting: Internal Medicine

## 2021-07-25 ENCOUNTER — Encounter: Payer: Self-pay | Admitting: Internal Medicine

## 2021-07-25 ENCOUNTER — Telehealth: Payer: Self-pay | Admitting: Cardiovascular Disease

## 2021-07-25 NOTE — Telephone Encounter (Signed)
We can see the patient.  She should be advised that a procedure could take months to schedule in her situation though she should be able to be seen in the office sooner than that. ? ?She definitely needs a new patient slot and if she is scheduled with an app please let me know who that is and when so I can communicate with them. ? ? ?

## 2021-07-25 NOTE — Telephone Encounter (Signed)
carvedilol (COREG) 6.25 MG tablet 90 tablet 0 07/22/2021    ?Sig - Route: TAKE 0.5 TABLETS (3.125 MG TOTAL) BY MOUTH 2 (TWO) TIMES DAILY WITH A MEAL. - Oral   ?Sent to pharmacy as: carvedilol (COREG) 6.25 MG tablet   ?E-Prescribing Status: Receipt confirmed by pharmacy (07/22/2021  2:50 PM EST)   ? ?Pharmacy ? ?[id number] ? ?

## 2021-07-25 NOTE — Telephone Encounter (Signed)
?*  STAT* If patient is at the pharmacy, call can be transferred to refill team. ? ? ?1. Which medications need to be refilled? (please list name of each medication and dose if known) carvedilol 3.125 mg po BID ? ?2. Which pharmacy/location (including street and city if local pharmacy) is medication to be sent to? [facility] [address]  ? ?3. Do they need a 30 day or 90 day supply? 90  ? ?

## 2021-07-25 NOTE — Telephone Encounter (Signed)
Hi Dr. Carlean Purl: ?(Doc of Day 07/25/21) ? ?This patient called requesting to be seen as she is having issues with diverticulitis, a history of diverticular abscess, and fecal incontinence.  She has been being followed by [facility]; however, they cannot see her until August.  Due to the issues she's having with her diverticulitis, her PCP sent a referral here requesting she be seen sooner.  Her records are in [place].  Please let me know if you approve her transfer.   ? ?Thank you. ?

## 2021-07-26 DIAGNOSIS — Z20822 Contact with and (suspected) exposure to covid-19: Secondary | ICD-10-CM | POA: Diagnosis not present

## 2021-08-03 ENCOUNTER — Ambulatory Visit: Payer: Medicare Other | Admitting: Family Medicine

## 2021-08-05 DIAGNOSIS — Z20822 Contact with and (suspected) exposure to covid-19: Secondary | ICD-10-CM | POA: Diagnosis not present

## 2021-08-08 DIAGNOSIS — Z20822 Contact with and (suspected) exposure to covid-19: Secondary | ICD-10-CM | POA: Diagnosis not present

## 2021-08-16 ENCOUNTER — Ambulatory Visit (INDEPENDENT_AMBULATORY_CARE_PROVIDER_SITE_OTHER): Payer: Medicare Other

## 2021-08-16 DIAGNOSIS — I5022 Chronic systolic (congestive) heart failure: Secondary | ICD-10-CM

## 2021-08-17 ENCOUNTER — Encounter: Payer: Self-pay | Admitting: Internal Medicine

## 2021-08-17 ENCOUNTER — Ambulatory Visit (INDEPENDENT_AMBULATORY_CARE_PROVIDER_SITE_OTHER): Payer: Medicare Other | Admitting: Internal Medicine

## 2021-08-17 ENCOUNTER — Other Ambulatory Visit (INDEPENDENT_AMBULATORY_CARE_PROVIDER_SITE_OTHER): Payer: Medicare Other

## 2021-08-17 VITALS — BP 110/60 | HR 90 | Ht 65.0 in | Wt 188.0 lb

## 2021-08-17 DIAGNOSIS — K5732 Diverticulitis of large intestine without perforation or abscess without bleeding: Secondary | ICD-10-CM

## 2021-08-17 DIAGNOSIS — Z9981 Dependence on supplemental oxygen: Secondary | ICD-10-CM | POA: Diagnosis not present

## 2021-08-17 DIAGNOSIS — K439 Ventral hernia without obstruction or gangrene: Secondary | ICD-10-CM

## 2021-08-17 DIAGNOSIS — N289 Disorder of kidney and ureter, unspecified: Secondary | ICD-10-CM

## 2021-08-17 DIAGNOSIS — K5902 Outlet dysfunction constipation: Secondary | ICD-10-CM

## 2021-08-17 DIAGNOSIS — K5909 Other constipation: Secondary | ICD-10-CM

## 2021-08-17 DIAGNOSIS — M797 Fibromyalgia: Secondary | ICD-10-CM | POA: Diagnosis not present

## 2021-08-17 LAB — CREATININE, SERUM: Creatinine, Ser: 0.87 mg/dL (ref 0.40–1.20)

## 2021-08-17 LAB — BUN: BUN: 18 mg/dL (ref 6–23)

## 2021-08-17 NOTE — Patient Instructions (Addendum)
Your provider has requested that you go to the basement level for lab work before leaving today. Press "B" on the elevator. The lab is located at the first door on the left as you exit the elevator. ? ?Due to recent changes in healthcare laws, you may see the results of your imaging and laboratory studies on MyChart before your provider has had a chance to review them.  We understand that in some cases there may be results that are confusing or concerning to you. Not all laboratory results come back in the same time frame and the provider may be waiting for multiple results in order to interpret others.  Please give [place] 48 hours in order for your provider to thoroughly review all the results before contacting the office for clarification of your results.  ? ?You will be contacted by [facility] in the next 2 days to arrange a CT abdomin and pelvis with contrast.  The number on your caller ID will be [phone], please answer when they call.  If you have not heard from them in 2 days please call [phone] to schedule.   ? ?Use an over the counter Fleet enema every other day. ? ?I appreciate the opportunity to care for you. ?Silvano Rusk, MD, [facility] ? ?

## 2021-08-17 NOTE — Progress Notes (Signed)
? [name] y.o. 07/21/1945 [id number] ? ?Assessment & Plan:  ? ?Encounter Diagnoses  ?Name Primary?  ? Diverticulitis of colon - recurrent Yes  ? Chronic constipation w/ epsiodic diarrhea   ? Dyssynergic defecation   ? Ventral hernia without obstruction or gangrene   ? On home oxygen therapy - prn after Covid   ? Kidney lesion   ? Fibromyalgia   ? ? ?Will perform CT scan to reassess diverticulitis.  I do not think she is a good colonoscopy candidate.  Age plus comorbidities and ventral hernia etc.  The odds of something significant like a polyp or tumor causing this diverticulitis are very very low.  She is comfortable with this plan. ? ?Her lifelong IBS-like problems are unlikely to be improved though I will look through her chart and see if I can come up with something.  I have suggested she try a Fleet enema every other day.  It sounds to me like she needs to improve most of her defecation to where she has less or no days where she has these voluminous numerous stools and incontinence.  She has indicated she would not go back to pelvic floor physical therapy.  I think that could be helpful but unfortunately the internal stimulation at exacerbated her fibromyalgia. ? ? ?CC: Ashley Bush, MD ? ? ?Subjective:  ? ?Chief Complaint: Diverticulitis ? ?HPI ?76 year old white woman with a long history of IBS problems fecal incontinence issues who more recently had been cared for at [facility] by Dr. Eliberto [name].  She also has a history of recurrent diverticulitis and abscesses and has had a segmental resection and more than 1 hospitalization for diverticulitis with abscess and percutaneous drainage over the years.  She also tells me she has had a lifelong struggle with bowel movement issues mostly with constipation and was even treated with enemas as a child.  She was concerned because she had diverticulitis again and it was going to take until August to get in with her [place] and he may be moving as  well and her primary care provider asked if she could be seen in [place] so I accepted her.  She actually had seen Dr. Silverio [name] again prior to going to [facility] and had an anal manometry as below.  Pelvic floor physical therapy was recommended at that time but apparently she did not follow through. ? ?Bowel habits are reported as insufficient defecation requiring manual disimpaction 5 days a week and then about once a week or so she will have onset of multiple stools and diarrhea up to 8 times in a day.  Long-term pattern of this.  She has been on MiraLAX Dulcolax but does not recall using Linzess or Amitiza.  At one point somebody gave her Xifaxan it sounds like but I do not think she is ever been tested for SIBO.  She tells me Dr. 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  She had severe breathing problems after COVID last year and was on oxygen daily and now uses it as needed.  She also describes issues with congestive heart failure and has an AICD pacemaker.  Has seen Ashley [name] as well.  She is not having rectal bleeding. ? ?CT abdomen and pelvis with contrast ? IMPRESSION: ?1. Colonic diverticulosis with uncomplicated splenic ?flexure/proximal descending colon diverticulitis. Recommend ?colonoscopy status post treatment and status post  complete ?resolution of inflammatory changes to exclude an underlying lesion. ?2. Indeterminate 1.4 cm right renal lesion. Recommend nonemergent ?MRI renal protocol for further evaluation. When the patient is ?clinically stable and able to follow directions and hold their ?breath (preferably as an outpatient) further evaluation with ?dedicated abdominal MRI should be considered. ?3. Large supraumbilical ventral wall hernia containing the majority ?of the transverse colon as well as several loops of small bowel. No ?associated  findings suggest ischemia or bowel obstruction. ?4.  Aortic Atherosclerosis (ICD10-I70.0). ? ?Electronically Signed ?  By: Ashley [name] M.D. ?  On: 06/30/2021 21:33 ? ?Anorectal manometry 05/19/2020 at [place] ? ?Impression:  ? ?1) Findings consistent with Type II dyssynergic defecation; weak rectal force with paradoxical anal sphincter contraction ? ?2) Able to expel rectal balloon in allotted time ? ?3) Rectal Hypersensitivity ? ?4) Decreased maximum and sustained squeeze pressures. This may be contributing to incontinence.  ? ?Recommendation: ? ?1) Recommend pelvic Floor PT with biofeedback therapy ? ?Ashley Pope, MD ?Dept of Gastroenterology ? ? ?Colonoscopy [facility] 924 2019-3 mm polyp at the as anastomosis removed with forceps, left-sided sigmoid resection and anastomosis, diverticulosis in the left colon and actually throughout the colon biopsies for microscopic colitis-these were normal and the polyp was not a true polyp ?Anorectal manometry 02/02/2016 [place] Dr. Silverio [name] ?Impression and Recommendations ?Weak internal and external anal sphincters ?No evidence of dyssynergia defecation based on manometry ?Abnormal rectal sensation suggestive of possible hyposensitivity ?Can benefit from pelvic floor strengthening and PT, will refer to Ashley [name] ? ? ?Last pelvic PT note reviewed this was from 10/22/2020 patient was using squatty potty and home exercises and the therapist thought she was improving but the patient showed decreased confidence and awareness of performance it was reported. ? ? ?Lab Results  ?Component Value Date  ? WBC 11.4 (H) 07/12/2021  ? HGB 12.6 07/12/2021  ? HCT 39.4 07/12/2021  ? MCV 90.3 07/12/2021  ? PLT 262.0 07/12/2021  ? ?Lab Results  ?Component Value Date  ? CREATININE 0.87 08/17/2021  ? BUN 18 08/17/2021  ? NA 138 06/30/2021  ? K 4.0 06/30/2021  ? CL 106 06/30/2021  ? CO2 21 (L) 06/30/2021  ? ?Lab Results  ?Component Value Date  ? ALT 32 06/30/2021  ? AST 26 06/30/2021  ? ALKPHOS 102  06/30/2021  ? BILITOT 0.4 06/30/2021  ? ? ? ?Allergies  ?Allergen Reactions  ? Atorvastatin Other (See Comments)  ?  Severe muscle cramps to generic atorvastatin.  ?Able to take brand lipitor.  ? Chlorhexidine Other (See Comments)  ?  blisters  ? Codeine Other (See Comments)  ?  Confusion and dizziness  ? Cyclobenzaprine Other (See Comments)  ?  Adverse reaction - back and throat pain, dizziness, exhaustion  ? Cymbalta [Duloxetine Hcl] Other (See Comments)  ?  "body spasms and made me feel weird in my head"   ? Diflucan [Fluconazole] Nausea And Vomiting  ? Dilaudid [Hydromorphone Hcl] Other (See Comments)  ?  Feels like she is burning internally   ? Furosemide Other (See Comments)  ?  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   ? Influenza Vac Split [Influenza Virus Vaccine] Other (See Comments)  ?  "allergic to concentrated eggs that are in vaccine"  ? Latex Nausea Only, Rash and Other (See Comments)  ?  Pain and infection  ? Morphine And Related Other (See Comments)  ?  Internally feels like she is  on fire   ? Pravastatin Other (See Comments)  ?  myalgias  ? Prevacid [Lansoprazole] Other (See Comments)  ?  Worsened GI side effects  ? Prevnar [Pneumococcal 13-Val Conj Vacc] Other (See Comments)  ?  Egg allergy ?Bad reaction after shot  ? Rosuvastatin Other (See Comments)  ?  Was not effective controlling lipids  ? Simvastatin Other (See Comments)  ?  Leg cramps  ? Tape Other (See Comments) and Rash  ?  All adhesives  - Blisters, itching and burning ? ?blistering  ? Wilder Glade [Dapagliflozin]   ? Metaxalone   ? Tramadol   ?  Leg cramps  ? Antihistamines, Chlorpheniramine-Type Other (See Comments)  ?  Alters vision  ? Betadine [Povidone Iodine] Rash  ?  Per pt.   ? Onion Diarrhea and Other (See Comments)  ?  GI distress, flared IBS  ? Other Hives and Rash  ?  NOTE: pt is able to take cephalosporins without reaction  ? Penicillins Other (See Comments)  ?  CHILDHOOD  ALLEGY/REACTION: "told 50 years ago I couldn't take it; might be immune to it", able to take amoxicillin ?Has patient had a PCN reaction causing immediate rash, facial/tongue/throat swelling, SOB or lightheadedness with h

## 2021-08-18 LAB — CUP PACEART REMOTE DEVICE CHECK
Battery Remaining Longevity: 37 mo
Battery Voltage: 2.94 V
Brady Statistic AP VP Percent: 0.01 %
Brady Statistic AP VS Percent: 0.04 %
Brady Statistic AS VP Percent: 98.79 %
Brady Statistic AS VS Percent: 1.15 %
Brady Statistic RA Percent Paced: 0.06 %
Brady Statistic RV Percent Paced: 98.53 %
Date Time Interrogation Session: 20230405211115
HighPow Impedance: 75 Ohm
Implantable Lead Implant Date: 20130301
Implantable Lead Implant Date: 20200622
Implantable Lead Implant Date: 20200622
Implantable Lead Location: 753859
Implantable Lead Location: 753860
Implantable Lead Location: 753860
Implantable Lead Model: 3830
Implantable Lead Model: 5076
Implantable Lead Model: 6935
Implantable Pulse Generator Implant Date: 20200622
Lead Channel Impedance Value: 247 Ohm
Lead Channel Impedance Value: 266 Ohm
Lead Channel Impedance Value: 342 Ohm
Lead Channel Impedance Value: 380 Ohm
Lead Channel Impedance Value: 437 Ohm
Lead Channel Impedance Value: 437 Ohm
Lead Channel Pacing Threshold Amplitude: 0.375 V
Lead Channel Pacing Threshold Amplitude: 0.875 V
Lead Channel Pacing Threshold Amplitude: 1.375 V
Lead Channel Pacing Threshold Pulse Width: 0.4 ms
Lead Channel Pacing Threshold Pulse Width: 0.4 ms
Lead Channel Pacing Threshold Pulse Width: 0.4 ms
Lead Channel Sensing Intrinsic Amplitude: 2 mV
Lead Channel Sensing Intrinsic Amplitude: 2 mV
Lead Channel Sensing Intrinsic Amplitude: 6 mV
Lead Channel Sensing Intrinsic Amplitude: 6 mV
Lead Channel Setting Pacing Amplitude: 1.5 V
Lead Channel Setting Pacing Amplitude: 2 V
Lead Channel Setting Pacing Amplitude: 2.5 V
Lead Channel Setting Pacing Pulse Width: 0.4 ms
Lead Channel Setting Pacing Pulse Width: 0.8 ms
Lead Channel Setting Sensing Sensitivity: 0.3 mV

## 2021-08-26 DIAGNOSIS — Z20822 Contact with and (suspected) exposure to covid-19: Secondary | ICD-10-CM | POA: Diagnosis not present

## 2021-08-29 DIAGNOSIS — Z20822 Contact with and (suspected) exposure to covid-19: Secondary | ICD-10-CM | POA: Diagnosis not present

## 2021-08-30 ENCOUNTER — Ambulatory Visit
Admission: RE | Admit: 2021-08-30 | Discharge: 2021-08-30 | Disposition: A | Discharge status: Home / Self Care | Payer: Medicare Other | Source: Ambulatory Visit | Attending: Internal Medicine | Admitting: Internal Medicine

## 2021-08-30 DIAGNOSIS — K5732 Diverticulitis of large intestine without perforation or abscess without bleeding: Secondary | ICD-10-CM | POA: Diagnosis not present

## 2021-08-30 DIAGNOSIS — N281 Cyst of kidney, acquired: Secondary | ICD-10-CM | POA: Diagnosis not present

## 2021-08-30 DIAGNOSIS — K439 Ventral hernia without obstruction or gangrene: Secondary | ICD-10-CM | POA: Diagnosis not present

## 2021-08-30 DIAGNOSIS — K6389 Other specified diseases of intestine: Secondary | ICD-10-CM | POA: Diagnosis not present

## 2021-08-30 MED ORDER — IOHEXOL 300 MG/ML  SOLN
100.0000 mL | Freq: Once | INTRAMUSCULAR | Status: AC | PRN
  Administered 2021-08-30: 100 mL via INTRAVENOUS

## 2021-08-31 ENCOUNTER — Telehealth: Payer: Self-pay

## 2021-08-31 DIAGNOSIS — N2889 Other specified disorders of kidney and ureter: Secondary | ICD-10-CM

## 2021-08-31 DIAGNOSIS — N281 Cyst of kidney, acquired: Secondary | ICD-10-CM

## 2021-08-31 NOTE — Telephone Encounter (Signed)
Patient wanted you to know the CT contrast caused her to have "explosive diarrhea golden colored." She complains of back pain. She wanted you to know this and put it on her chart as well. ?

## 2021-08-31 NOTE — Telephone Encounter (Signed)
Patient was seen and had CT done. It showed that she needed to have further evaluation on Kidneys. She was under the impression that she needed to have that ordered by you.  ?

## 2021-09-01 NOTE — Addendum Note (Signed)
Addended by: [name] on: 09/01/2021 11:47 PM ? ? Modules accepted: Orders ? ?

## 2021-09-01 NOTE — Telephone Encounter (Signed)
I have a phone call into radiology checking into her specific pacemaker/defibrillator to see if it is MRI safe.  ?

## 2021-09-01 NOTE — Telephone Encounter (Addendum)
I spoke with radiology and radiology tech who specifically looked up her pacemaker/defibrillator with serial # - they say she should be safe to proceed with MRI based on the type of her Medtronic BiVentricular device.  ?I will order MRI to further look at R kidney lesion. Plz let pt know.  ?

## 2021-09-01 NOTE — Telephone Encounter (Signed)
Noted  

## 2021-09-01 NOTE — Progress Notes (Signed)
Remote ICD transmission.   

## 2021-09-02 NOTE — Telephone Encounter (Signed)
Noted. Will cancel MRI.  ?

## 2021-09-02 NOTE — Addendum Note (Signed)
Addended by: [name] on: 09/02/2021 04:55 PM ? ? Modules accepted: Orders ? ?

## 2021-09-02 NOTE — Telephone Encounter (Signed)
Spoke with pt relaying Dr. Synthia Innocent message.  Pt prefers not to have MRI. State she was told under no circumstances is she to have an MRI.  So pt is being sent to [facility] and she will call them on Monday to schedule an appt.  ?

## 2021-09-05 ENCOUNTER — Encounter: Payer: Self-pay | Admitting: Internal Medicine

## 2021-09-05 DIAGNOSIS — D4101 Neoplasm of uncertain behavior of right kidney: Secondary | ICD-10-CM | POA: Diagnosis not present

## 2021-09-05 NOTE — Telephone Encounter (Signed)
Diarrhea quite common after CT contrast (oral) ?Noted ?

## 2021-09-09 ENCOUNTER — Other Ambulatory Visit: Payer: Self-pay | Admitting: Nurse Practitioner

## 2021-09-12 ENCOUNTER — Emergency Department (HOSPITAL_COMMUNITY): Payer: Medicare Other

## 2021-09-12 ENCOUNTER — Telehealth: Payer: Self-pay

## 2021-09-12 ENCOUNTER — Encounter (HOSPITAL_COMMUNITY): Payer: Self-pay

## 2021-09-12 ENCOUNTER — Emergency Department (HOSPITAL_COMMUNITY)
Admission: EM | Admit: 2021-09-12 | Discharge: 2021-09-12 | Disposition: A | Discharge status: Home / Self Care | Payer: Medicare Other | Attending: Emergency Medicine | Admitting: Emergency Medicine

## 2021-09-12 ENCOUNTER — Other Ambulatory Visit: Payer: Self-pay

## 2021-09-12 DIAGNOSIS — Z95810 Presence of automatic (implantable) cardiac defibrillator: Secondary | ICD-10-CM | POA: Insufficient documentation

## 2021-09-12 DIAGNOSIS — R0602 Shortness of breath: Secondary | ICD-10-CM

## 2021-09-12 DIAGNOSIS — I5023 Acute on chronic systolic (congestive) heart failure: Secondary | ICD-10-CM | POA: Diagnosis not present

## 2021-09-12 DIAGNOSIS — Z7982 Long term (current) use of aspirin: Secondary | ICD-10-CM | POA: Insufficient documentation

## 2021-09-12 DIAGNOSIS — Z7951 Long term (current) use of inhaled steroids: Secondary | ICD-10-CM | POA: Insufficient documentation

## 2021-09-12 DIAGNOSIS — J449 Chronic obstructive pulmonary disease, unspecified: Secondary | ICD-10-CM | POA: Diagnosis not present

## 2021-09-12 DIAGNOSIS — R600 Localized edema: Secondary | ICD-10-CM | POA: Diagnosis not present

## 2021-09-12 DIAGNOSIS — Z20822 Contact with and (suspected) exposure to covid-19: Secondary | ICD-10-CM | POA: Diagnosis not present

## 2021-09-12 DIAGNOSIS — R079 Chest pain, unspecified: Secondary | ICD-10-CM | POA: Diagnosis not present

## 2021-09-12 DIAGNOSIS — Z91040 Latex allergy status: Secondary | ICD-10-CM | POA: Insufficient documentation

## 2021-09-12 DIAGNOSIS — J45909 Unspecified asthma, uncomplicated: Secondary | ICD-10-CM | POA: Insufficient documentation

## 2021-09-12 LAB — BASIC METABOLIC PANEL
Anion gap: 9 (ref 5–15)
BUN: 18 mg/dL (ref 8–23)
CO2: 22 mmol/L (ref 22–32)
Calcium: 9.7 mg/dL (ref 8.9–10.3)
Chloride: 107 mmol/L (ref 98–111)
Creatinine, Ser: 0.84 mg/dL (ref 0.44–1.00)
GFR, Estimated: >60 mL/min (ref >60)
Glucose, Bld: 109 mg/dL — ABNORMAL HIGH (ref 70–99)
Potassium: 3.9 mmol/L (ref 3.5–5.1)
Sodium: 138 mmol/L (ref 135–145)

## 2021-09-12 LAB — CBC WITH DIFFERENTIAL/PLATELET
Abs Immature Granulocytes: 0.02 10*3/uL (ref 0.00–0.07)
Basophils Absolute: 0 10*3/uL (ref 0.0–0.1)
Basophils Relative: 0 %
Eosinophils Absolute: 0.1 10*3/uL (ref 0.0–0.5)
Eosinophils Relative: 1 %
HCT: 39.4 % (ref 36.0–46.0)
Hemoglobin: 12.3 g/dL (ref 12.0–15.0)
Immature Granulocytes: 0 %
Lymphocytes Relative: 17 %
Lymphs Abs: 1.7 10*3/uL (ref 0.7–4.0)
MCH: 29.1 pg (ref 26.0–34.0)
MCHC: 31.2 g/dL (ref 30.0–36.0)
MCV: 93.1 fL (ref 80.0–100.0)
Monocytes Absolute: 1 10*3/uL (ref 0.1–1.0)
Monocytes Relative: 10 %
Neutro Abs: 7.1 10*3/uL (ref 1.7–7.7)
Neutrophils Relative %: 72 %
Platelets: 178 10*3/uL (ref 150–400)
RBC: 4.23 MIL/uL (ref 3.87–5.11)
RDW: 13.2 % (ref 11.5–15.5)
WBC: 10 10*3/uL (ref 4.0–10.5)
nRBC: 0 % (ref 0.0–0.2)

## 2021-09-12 LAB — BRAIN NATRIURETIC PEPTIDE: B Natriuretic Peptide: 636.9 pg/mL — ABNORMAL HIGH (ref 0.0–100.0)

## 2021-09-12 LAB — TROPONIN I (HIGH SENSITIVITY)
Troponin I (High Sensitivity): 13 ng/L (ref <18)
Troponin I (High Sensitivity): 13 ng/L (ref <18)

## 2021-09-12 MED ORDER — TORSEMIDE 20 MG PO TABS
10.0000 mg | ORAL_TABLET | Freq: Once | ORAL | Status: DC
  Filled 2021-09-12: qty 1

## 2021-09-12 NOTE — ED Provider Triage Note (Signed)
Emergency Medicine Provider Triage Evaluation Note ? ?Ashley [name] , a 76 y.o. female  was evaluated in triage.  Pt complains of SHOB onset Friday with wheezing and rattling in the chest. Used her 2L [place] with improvement. States did have a twinge of CP left side radiates to jaw and teeth but feels same as her heart failure pains. Denies fevers, chills, URI symptoms. ? ?Review of Systems  ?Positive: SHOB, wheezing, CP ?Negative: fever ? ?Physical Exam  ?BP 119/81 (BP Location: Left Arm)   Pulse 91   Temp 98.1 ?F (36.7 ?C) (Oral)   Resp 18   SpO2 97%  ?Gen:   Awake, no distress   ?Resp:  Normal effort at rest, does appear SHOB with speaking in long sentences ?MSK:   Moves extremities without difficulty  ?Other:  Trace bilateral LE edema, lungs CTA ? ?Medical Decision Making  ?Medically screening exam initiated at 9:24 AM.  Appropriate orders placed.  Ashley [name] was informed that the remainder of the evaluation will be completed by another provider, this initial triage assessment does not replace that evaluation, and the importance of remaining in the ED until their evaluation is complete. ? ? ?  ?Tacy Learn, PA-C ?09/12/21 [phone] ? ?

## 2021-09-12 NOTE — Telephone Encounter (Signed)
Per chart review tab pt is presently at [facility] ED. Sending note to Dr Darnell Level and Lattie Haw CMA. ?

## 2021-09-12 NOTE — ED Provider Notes (Signed)
?[facility] ?Provider Note ? ? ?CSN: [id number] ?Arrival date & time: 09/12/21  2585 ? ?  ? ?History ? ?Chief Complaint  ?Patient presents with  ? Shortness of Breath  ? ? ?Ashley [name] is a 76 y.o. female. ? ?Patient with history of HFrEF on torsemide, status post pacemaker/defibrillator, asthma, bowel resection and diverticulitis --presents to the emergency department today for evaluation of shortness of breath.  Patient states that she was on oxygen after having COVID last year, however currently is only on oxygen as needed.  She reports increasing episodes of wheezing over the past couple weeks.  This was worse last night and she was using oxygen prompting emergency department visit today.  When she gets episodes of wheezing she has a tightness in her abdomen and the back of her neck.  She denies chest pain.  No fever or cough.  She denies lower extremity edema but states that typically when she has a heart failure exacerbation she gets more swelling in her abdomen and her lower extremities.  Patient checks her oxygen saturation at home, states that they have been running 94-97%. ? ?Pt weight 85.3kg 3 weeks ago. 85kg today.  ?  ? ?Home Medications ?Prior to Admission medications   ?Medication Sig Start Date End Date Taking? Authorizing Provider  ?acetaminophen (TYLENOL) 500 MG tablet Take 500 mg by mouth daily as needed for moderate pain or headache (PAIN).     [provider]  ?AMBULATORY NON FORMULARY MEDICATION Medication Name:  1 squatty potty to use as needed 01/20/16   Mauri Pole, MD  ?Ascorbic Acid (VITAMIN C) 1000 MG tablet Take 1,000 mg by mouth See admin instructions. Take 1000 mg daily, may take an additional 1000 mg as needed for cold symptoms    [provider]  ?aspirin EC 81 MG tablet Take 81 mg by mouth at bedtime.     [provider]  ?beclomethasone (QVAR) 80 MCG/ACT inhaler Inhale 2 puffs into the lungs 2 (two) times  daily. 03/04/21   Parrett, Fonnie Mu, NP  ?benzonatate (TESSALON) 200 MG capsule Take 1 capsule (200 mg total) by mouth 3 (three) times daily as needed for cough. 12/31/20   [name], MD  ?Biotin 10 MG CAPS Take 1 tablet by mouth daily.    [provider]  ?carvedilol (COREG) 6.25 MG tablet TAKE 0.5 TABLETS (3.125 MG TOTAL) BY MOUTH 2 (TWO) TIMES DAILY WITH A MEAL. 07/22/21   Theora Gianotti, NP  ?Cholecalciferol (VITAMIN D3) 50 MCG (2000 UT) capsule Take 1 capsule (2,000 Units total) by mouth daily. 04/15/21   [name], MD  ?Coenzyme Q10 (COQ-10) 100 MG CAPS Take 100 mg by mouth daily.    [provider]  ?COLLAGEN PO Take by mouth daily.    [provider]  ?dextromethorphan (DELSYM) 30 MG/5ML liquid Take 15 mg by mouth daily as needed for cough.     [provider]  ?ezetimibe (ZETIA) 10 MG tablet TAKE 1 TABLET BY MOUTH EVERY DAY 05/03/21   Minna Merritts, MD  ?fluorouracil (EFUDEX) 5 % cream Apply topically. 06/05/20   [provider]  ?GARLIC PO Take 1 capsule by mouth daily.    [provider]  ?guaiFENesin (MUCINEX) 600 MG 12 hr tablet Take 600 mg by mouth 2 (two) times daily.    [provider]  ?levalbuterol Penne Lash HFA) 45 MCG/ACT inhaler Inhale 2 puffs into the lungs every 6 (six) hours as needed  for wheezing or shortness of breath. 03/04/21   Parrett, Fonnie Mu, NP  ?LORazepam (ATIVAN) 0.5 MG tablet Take 0.5-1 tablets (0.25-0.5 mg total) by mouth at bedtime as needed for anxiety or sleep. 03/22/20   [name], MD  ?magnesium 30 MG tablet Take 30 mg by mouth daily.    [provider]  ?Menthol, Topical Analgesic, (BIOFREEZE ROLL-ON EX) Apply topically.    [provider]  ?miconazole (MICOTIN) 2 % powder Apply 1 application topically as needed (yeast).    [provider]  ?nystatin cream (MYCOSTATIN) Apply 1 application topically as needed for dry skin (yeast infection). 04/15/21    [name], MD  ?Omega-3 Fatty Acids (FISH OIL) 1000 MG CAPS Take 1 capsule (1,000 mg total) by mouth daily. 12/04/20   Patrecia Pour, MD  ?OXYGEN Inhale into the lungs as needed.    [provider]  ?potassium chloride (KLOR-CON) 10 MEQ tablet Take 1 tablet (10 mEq total) by mouth 3 (three) times a week. 01/05/21   Theora Gianotti, NP  ?pyridOXINE (VITAMIN B-6) 100 MG tablet Take 100 mg by mouth daily.    [provider]  ?Red Yeast Rice Extract (RED YEAST RICE PO) Take 1 capsule by mouth in the morning and at bedtime.    [provider]  ?sacubitril-valsartan (ENTRESTO) 49-51 MG Take 1 tablet by mouth daily. 05/26/21   Baldwin Jamaica, PA-C  ?spironolactone (ALDACTONE) 25 MG tablet Take 1 tablet (25 mg total) by mouth daily. 01/05/21   Theora Gianotti, NP  ?torsemide (DEMADEX) 10 MG tablet TAKE 1 TABLET BY MOUTH THREE TIMES A WEEK 09/09/21   Theora Gianotti, NP  ?triamcinolone (NASACORT) 55 MCG/ACT AERO nasal inhaler Place into the nose.    [provider]  ?vitamin E 400 UNIT capsule Take 400 Units by mouth daily.    [provider]  ?bisoprolol (ZEBETA) 5 MG tablet Take 5 mg by mouth daily. 06/06/11 07/02/18  Minna Merritts, MD  ?   ? ?Allergies    ?Atorvastatin; Chlorhexidine; Codeine; Cyclobenzaprine; Cymbalta [duloxetine hcl]; Diflucan [fluconazole]; Dilaudid [hydromorphone hcl]; Furosemide; Influenza vac split [influenza virus vaccine]; Latex; Morphine and related; Pravastatin; Prevacid [lansoprazole]; Prevnar [pneumococcal 13-val conj vacc]; Rosuvastatin; Simvastatin; Tape; Farxiga [dapagliflozin]; Metaxalone; Tramadol; Antihistamines, chlorpheniramine-type; Betadine [povidone iodine]; Onion; Other; Penicillins; Red dye; Sertraline; and Sulfasalazine   ? ?Review of Systems   ?Review of Systems ? ?Physical Exam ?Updated Vital Signs ?BP 119/81 (BP Location: Left Arm)   Pulse 91   Temp 98.1 ?F (36.7 ?C) (Oral)   Resp 18   SpO2  97%  ? ?Physical Exam ?Vitals and nursing note reviewed.  ?Constitutional:   ?   Appearance: She is well-developed. She is not diaphoretic.  ?HENT:  ?   Head: Normocephalic and atraumatic.  ?   Mouth/Throat:  ?   Mouth: Mucous membranes are not dry.  ?Eyes:  ?   Conjunctiva/sclera: Conjunctivae normal.  ?Neck:  ?   Vascular: Normal carotid pulses. No JVD.  ?   Trachea: Trachea normal. No tracheal deviation.  ?Cardiovascular:  ?   Rate and Rhythm: Normal rate and regular rhythm.  ?   Pulses: No decreased pulses.     ?     Radial pulses are 2+ on the right side and 2+ on the left side.  ?   Heart sounds: Normal heart sounds, S1 normal and S2 normal. No murmur heard. ?Pulmonary:  ?   Effort: Pulmonary effort is normal. No respiratory  distress.  ?   Breath sounds: No wheezing, rhonchi or rales.  ?   Comments: Lung sounds clear at time of exam, no constant wheezing ?Chest:  ?   Chest wall: No tenderness.  ?Abdominal:  ?   General: Bowel sounds are normal.  ?   Palpations: Abdomen is soft.  ?   Tenderness: There is no abdominal tenderness. There is no guarding or rebound.  ?Musculoskeletal:     ?   General: Normal range of motion.  ?   Cervical back: Normal range of motion and neck supple. No muscular tenderness.  ?   Right lower leg: No tenderness. Edema present.  ?   Left lower leg: No tenderness. Edema present.  ?   Comments: Trace bilateral lower extremity edema  ?Skin: ?   General: Skin is warm and dry.  ?   Coloration: Skin is not pale.  ?Neurological:  ?   Mental Status: She is alert.  ? ? ?ED Results / Procedures / Treatments   ?Labs ?(all labs ordered are listed, but only abnormal results are displayed) ?Labs Reviewed  ?BASIC METABOLIC PANEL - Abnormal; Notable for the following components:  ?    Result Value  ? Glucose, Bld 109 (*)   ? All other components within normal limits  ?BRAIN NATRIURETIC PEPTIDE - Abnormal; Notable for the following components:  ? B Natriuretic Peptide 636.9 (*)   ? All other components  within normal limits  ?CBC WITH DIFFERENTIAL/PLATELET  ?TROPONIN I (HIGH SENSITIVITY)  ? ? ?ED ECG REPORT ? ? Date: 09/12/2021 ? Rate: 91 ? Rhythm: paced ? ?I have personally reviewed the EKG tracing and agr

## 2021-09-12 NOTE — ED Notes (Signed)
Pt ambulated in the hallway with a steady gait and minimal assistance from this writer. Pt maintained her SpO2 saturation at 95% RA. Pt returned to bed.  ?

## 2021-09-12 NOTE — Telephone Encounter (Signed)
Access nurse note not yet available - did she say what symptoms she was having?  ?

## 2021-09-12 NOTE — ED Notes (Signed)
RN unable to obtain IV access, phlebotomy to attempt ?

## 2021-09-12 NOTE — Telephone Encounter (Signed)
Patient called was sent to access nurse and told to go to ED. She did not want to go to [facility]. Requested that we get her set up to be seen in our office and call 911 from our office so that she can go to [facility]. Patient does have husband that can drive her to hospital of choice. Advised patient for husband to drive to [facility] and if on the way her symptoms increase that she can have him pull over and call 911. Patient has agreed to that. They will leave now.  ?

## 2021-09-12 NOTE — Telephone Encounter (Signed)
[facility] Night - Client ?TELEPHONE ADVICE RECORD ?AccessNurse? ?Patient ?Name: ?Ashley ?E [name] ?Gender: Female ?DOB: 1945/06/03 ?Age: 76 Y 1 M 25 D ?Return ?Phone ?Number: ?[phone] ?(Primary) ?Address: ?City/ ?State/ ?Zip: [name] ? [address] ?Client [facility] Night - Client ?Client Site [place] ?Provider [name] - MD ?Contact Type Call ?Who Is Calling Patient / Member / Family / Caregiver ?Call Type Triage / Clinical ?Caller Name [facility] ?Relationship To Patient Self ?Return Phone Number [phone] (Primary) ?Chief Complaint BREATHING - shortness of breath or sounds ?breathless ?Reason for Call Request to Schedule Office Appointment ?Initial Comment Caller states she has is wheezing . Last night she ?had to get back on 2% oxygen. She is very short of ?breath ?Translation No ?Nurse Assessment ?Nurse: Waymond Cera, RN, Benjamine Mola Date/Time (Eastern Time): 09/12/2021 7:40:13 AM ?Confirm and document reason for call. If ?symptomatic, describe symptoms. ?---Caller states that she is having wheezing and ?shortness of breath. Had to get on 2l/. States 94%. ?Does the patient have any new or worsening ?symptoms? ---Yes ?Will a triage be completed? ---Yes ?Related visit to physician within the last 2 weeks? ---No ?Does the PT have any chronic conditions? (i.e. ?diabetes, asthma, this includes High risk factors for ?pregnancy, etc.) ?---Yes ?List chronic conditions. ---CHF diverticulosis- with partial colon removal ?Is this a behavioral health or substance abuse call? ---No ?Guidelines ?Guideline Title Affirmed Question Affirmed Notes Nurse Date/Time (Eastern ?Time) ?Breathing Difficulty SEVERE difficulty ?breathing (e.g., ?struggling for each ?breath, speaks in ?single words) ?Cantrell, RN, ?Elizabeth ?09/12/2021 7:41:01 AM ?Disp. Time (Eastern ?Time) Disposition Final User ?09/12/2021 7:38:58 AM Send to Urgent Queue  Vinnie Level ?PLEASE NOTE: All timestamps contained within this report are represented as [place] Standard Time. ?CONFIDENTIALTY NOTICE: This fax transmission is intended only for the addressee. It contains information that is legally privileged, confidential or ?otherwise protected from use or disclosure. If you are not the intended recipient, you are strictly prohibited from reviewing, disclosing, copying using ?or disseminating any of this information or taking any action in reliance on or regarding this information. If you have received this fax in error, please ?notify us immediately by telephone so that we can arrange for its return to [place]. Phone: [phone], Toll-Free: [phone], Fax: [phone] ?Page: 2 of 2 ?Call Id: [id number] ?Disp. Time (Eastern ?Time) Disposition Final User ?09/12/2021 7:48:46 AM 911 Outcome Documentation Cantrell, RN, Benjamine Mola ?Reason: Not calling see note. ?09/12/2021 7:45:16 AM Call EMS 911 Now Yes Cantrell, RN, Benjamine Mola ?Caller Disagree/Comply Disagree ?Caller Understands Yes ?PreDisposition InappropriateToAsk ?Care Advice Given Per Guideline ?CALL EMS 911 NOW: * Immediate medical attention is needed. You need to hang up and call 911 (or an ambulance). CARE ?ADVICE given per Breathing Difficulty (Adult) guideline. * Triager Discretion: I'll call you back in a few minutes to be sure you ?were able to reach them. ?Comments ?User: Sharol Given, RN Date/Time [name] Time): 09/12/2021 7:48:25 AM ?Caller does not want to go to the ER or call 911. States that she wants to be seen in the office. Advised risk of ?getting worse/possible death if delaying care. Verbalizes understanding. ?Referrals ?GO TO FACILITY REFUSE ?

## 2021-09-12 NOTE — Discharge Instructions (Signed)
Please read and follow all provided instructions. ? ?Your diagnoses today include:  ?1. Shortness of breath   ?2. Acute on chronic systolic congestive heart failure ([place])   ? ? ?Tests performed today include: ?An EKG of your heart: No signs of problems today ?A chest x-ray: Shows question of some heart failure ?Cardiac enzymes: a blood test for heart muscle damage ?Blood counts and electrolytes ?Screening test for heart failure: Is elevated hinting at fluid buildup in your lungs ?Vital signs. See below for your results today.  ? ?Medications prescribed:  ?Albuterol inhaler - medication that opens up your airway ? ?Use inhaler as follows: 1-2 puffs with spacer every 4 hours as needed for wheezing, cough, or shortness of breath.  ? ?Take any prescribed medications only as directed. ? ?Follow-up instructions: ?Please follow-up with your cardiologist in the next 3 days for recheck of your symptoms. ? ?Return instructions:  ?SEEK IMMEDIATE MEDICAL ATTENTION IF: ?You have severe chest pain, especially if the pain is crushing or pressure-like and spreads to the arms, back, neck, or jaw, or if you have sweating, nausea or vomiting, or trouble with breathing. THIS IS AN EMERGENCY. Do not wait to see if the pain will go away. Get medical help at once. Call 911. DO NOT drive yourself to the hospital.  ?Your chest pain gets worse and does not go away after a few minutes of rest.  ?You have an attack of chest pain lasting longer than what you usually experience.  ?You have significant dizziness, if you pass out, or have trouble walking.  ?You have chest pain not typical of your usual pain for which you originally saw your caregiver.  ?You have any other emergent concerns regarding your health. ? ?Additional Information: ?Chest pain comes from many different causes. Your caregiver has diagnosed you as having chest pain that is not specific for one problem, but does not require admission.  You are at low risk for an acute heart  condition or other serious illness.  ? ?Your vital signs today were: ?BP 122/65   Pulse 78   Temp 98.1 ?F (36.7 ?C) (Oral)   Resp 20   Wt 84.8 kg   SpO2 98%   BMI 31.12 kg/m?  ?If your blood pressure (BP) was elevated above 135/85 this visit, please have this repeated by your doctor within one month. ?-------------- ? ? ?

## 2021-09-12 NOTE — ED Triage Notes (Signed)
Pt reports worsening wheezing and sob since Friday night, has oxygen at home PRN, has been wearing 2L since Friday. SpO2 94-97%. ?Pt a.o, resp e.u ?

## 2021-09-13 DIAGNOSIS — Z20822 Contact with and (suspected) exposure to covid-19: Secondary | ICD-10-CM | POA: Diagnosis not present

## 2021-09-13 NOTE — Telephone Encounter (Signed)
ER note reviewed. ?Dx CHF exacerbation.  ?Planned cards f/u later this week.  ?

## 2021-09-15 ENCOUNTER — Encounter: Payer: Self-pay | Admitting: Medical

## 2021-09-15 ENCOUNTER — Ambulatory Visit (INDEPENDENT_AMBULATORY_CARE_PROVIDER_SITE_OTHER): Payer: Medicare Other | Admitting: Medical

## 2021-09-15 ENCOUNTER — Ambulatory Visit (INDEPENDENT_AMBULATORY_CARE_PROVIDER_SITE_OTHER): Payer: Medicare Other

## 2021-09-15 VITALS — BP 120/60 | HR 96 | Ht 65.0 in | Wt 185.0 lb

## 2021-09-15 DIAGNOSIS — I5022 Chronic systolic (congestive) heart failure: Secondary | ICD-10-CM

## 2021-09-15 DIAGNOSIS — Z95810 Presence of automatic (implantable) cardiac defibrillator: Secondary | ICD-10-CM | POA: Diagnosis not present

## 2021-09-15 DIAGNOSIS — I420 Dilated cardiomyopathy: Secondary | ICD-10-CM | POA: Diagnosis not present

## 2021-09-15 DIAGNOSIS — R0602 Shortness of breath: Secondary | ICD-10-CM

## 2021-09-15 MED ORDER — PERFLUTREN LIPID MICROSPHERE
1.0000 mL | INTRAVENOUS | Status: AC | PRN
  Administered 2021-09-15: 2 mL via INTRAVENOUS

## 2021-09-15 MED ORDER — LEVALBUTEROL TARTRATE 45 MCG/ACT IN AERO
2.0000 | INHALATION_SPRAY | Freq: Four times a day (QID) | RESPIRATORY_TRACT | 3 refills | Status: AC | PRN
Start: 2021-09-15 — End: ?

## 2021-09-15 NOTE — Patient Instructions (Signed)
Medication Instructions:  ?Your physician has recommended you make the following change in your medication:  ? ?TAKE Torsemide 10 mg daily for 2 weeks ? ?TAKE Potassium 10 meq daily for 2 weeks ? ?*If you need a refill on your cardiac medications before your next appointment, please call your pharmacy* ? ? ?Lab Work: ?None ordered ?If you have labs (blood work) drawn today and your tests are completely normal, you will receive your results only by: ?MyChart Message (if you have MyChart) OR ?A paper copy in the mail ?If you have any lab test that is abnormal or we need to change your treatment, we will call you to review the results. ? ? ?Testing/Procedures: ?Your physician has requested that you have an echocardiogram. Echocardiography is a painless test that uses sound waves to create images of your heart. It provides your doctor with information about the size and shape of your heart and how well your heart?s chambers and valves are working. This procedure takes approximately one hour. There are no restrictions for this procedure. ? ? ? ?Follow-Up: ?At [facility], you and your health needs are our priority.  As part of our continuing mission to provide you with exceptional heart care, we have created designated Provider Care Teams.  These Care Teams include your primary Cardiologist (physician) and Advanced Practice Providers (APPs -  Physician Assistants and Nurse Practitioners) who all work together to provide you with the care you need, when you need it. ? ?We recommend signing up for the patient portal called "MyChart".  Sign up information is provided on this After Visit Summary.  MyChart is used to connect with patients for Virtual Visits (Telemedicine).  Patients are able to view lab/test results, encounter notes, upcoming appointments, etc.  Non-urgent messages can be sent to your provider as well.   ?To learn more about what you can do with MyChart, go to [email or web address].   ? ?Your next  appointment:   ?As planned  ? ?The format for your next appointment:   ?In Person ? ?Provider:   ?[name], MD{ ? ? ? ?Other Instructions ?N/A ? ?Important Information About Sugar ? ? ? ? ? ? ?

## 2021-09-15 NOTE — Progress Notes (Signed)
?Cardiology Office Note:   ? ?Date:  09/15/2021  ? ?ID:  Ashley [name], DOB 06-02-1945, MRN [id number] ? ?PCP:  [name], MD  ?[facility] Cardiologist:  None  ?[facility] Electrophysiologist:  Cristopher Peru, MD  ? ?Referring MD: Carlisle Cater, PA-C  ? ?Chief Complaint: SOB/CHF ? ?History of Present Illness:   ? ?Ashley [name] is a 76 y.o. female with a hx of with a history of morbid obesity, HTN, COPD, GERD , fibromyaligia, s/p partial colon resection for diverticulitis with re-connection, MGUS, LBBB and systolic HF due to NICM s/p CRT-D referred by Dr. [name] for further evaluation of her HF. ?  ?Has h/o NICM dating back to 2012 when she had PNA October 2012 ejection fraction  25-35%,  ?September 2013 ejection fraction 30-35%.  EF in 2019 had recovered to 60-65% but now EF is back down to 15-20%.  ?  ?Admitted 6/20 with increased dyspnea and chest pain. Prior to admit she had been treated with levaquin + prednisone for bronchitis. CXR was concerning for interstitial edema. Bedside ECHO performed in the ED showed severely reduced LVEF-->15% and severe MR. Placed on IV lasix and set up for cath. In 6/20 had RHC/LHC with normal coronaries, EF 20-25%, and mildly elevated filling pressures. Renal function has been stable. Underwent upgrade to CRT-D ?  ?Last echo 10/21: EF 25% ?  ?In July, she contracted COVID and was placed on paxlovid and subsequently developed diarrhea and was admitted for subsequent dehydration and AKI. Received IVFs and meds temporarily held but able to resume HF regimen. Also w/ hypoxia. D-dimer was elevated by chest CT negative for PE. Hypoxia felt 2/2 COVID and sent home w/ home O2.  ?  ?She had f/u in cardiology clinic 8/22 and echo repeated, showing LVEF 30%. RV normal.  ? ?Patient seen by the advanced heart clinic 02/10/21 and was overall doing well.  ? ?Seen by EP 06/06/21 for reduced effective BIV pacing, complaining of long standing DOE and CP. ? ?ER visit 09/12/21 for  SOB. O2 was normal. BNP 639. CXR with b/l pleural effusion. HS trop was normal. She did not want diuretics from the ER.  ? ?Today, patient has chronic shortness of breath, but has been worse recently. Also has asthma exacerbation, has pulmonary appt next week. Cannot take allergy medications. No chest pain today, occasionally has some chest discomfort.  She eats a low salt diet. She has a lot of medications allergies. She takes torsemide '10mg'$  only twice a week, she is very concerned affect torsemide has on the kidneys, this was discussed in detail today. She took one torsemide yesterday. She has chronic orthopnea. She is asking for refill of her inhaler.  ? ?Past Medical History:  ?Diagnosis Date  ? Abdominal aortic atherosclerosis ([place]) 11/2015  ? by CT  ? Allergy   ? Anemia   ? Anxiety   ? Automatic implantable cardioverter-defibrillator in situ   ? a. 2012 s/p MDT single lead ICD; b. 10/2018 BiV upgrade-->MDT ser # [id number] H.  ? Bronchiectasis   ? a. possibly mild, treat URIs aggressively  ? Cancer [facility])   ? basal cell, arms & neck  ? Chronic sinusitis   ? COPD (chronic obstructive pulmonary disease) ([place])   ? COVID-19 12/06/2020  ? Depression   ? Dyspnea   ? Essential hypertension   ? Pt denies  ? Family history of adverse reaction to anesthesia   ? Mother - PONV  ? Fibrocystic breast disease   ?  Fibromyalgia   ? GERD (gastroesophageal reflux disease)   ? H/O multiple allergies 10/10/2013  ? Hemorrhoids   ? HFrEF (heart failure with reduced ejection fraction) ([place])   ? a. 06/2011 cMRI: EF 34%, no LGE; b. 01/2016 Echo: EF 35-40%; c. 07/2016 Echo: EF 40-45%; d. 02/2020 Echo: EF 20-25%, glob HK. GrI DD. Mildly red RV fxn. Mildly dil LA. Triv MR.  ? History of diverticulitis of colon   ? History of pneumonia 2012  ? Insomnia   ? Irritable bowel syndrome   ? Laryngeal nodule   ? Dr. Thomasena Edis  ? Migraine without aura, without mention of intractable migraine without mention of status migrainosus   ? Mitral regurgitation   ?  Monoclonal gammopathy   ? Neuropathy   ? feet and hands  ? Nonischemic cardiomyopathy ([place])   ? a. 2012 s/p single lead ICD; b. 06/2011 cMRI: EF 34%, no LGE; b. 01/2016 Echo: EF 35-40%; c. 07/2016 Echo: EF 40-45%; d. 10/2018 Cath: nl cors. EF 20-25%; e. 10/2018 s/p BiV MDT ICD upgrade; f. 02/2020 Echo: EF 20-25%, glob HK. GrI DD.  ? Osteoarthritis   ? knees, back, hands, low back,   ? Osteoporosis 01/2013  ? T-2.7 spine (01/2016)  ? PONV (postoperative nausea and vomiting)   ? sigmoid diverticulitis with perforation, abscess and fistula 09/30/2013  ? Sleep apnea 2010  ? borderline, inconclusive- /w [place]   ? ? ?Past Surgical History:  ?Procedure Laterality Date  ? ABDOMINAL HYSTERECTOMY  1987  ? w/BSO  ? ANAL RECTAL MANOMETRY N/A 02/02/2016  ? Procedure: ANO RECTAL MANOMETRY;  Surgeon: Mauri Pole, MD;  Location: [facility];  Service: Endoscopy;  Laterality: N/A;  ? APPENDECTOMY    ? AUGMENTATION MAMMAPLASTY    ? BIV UPGRADE N/A 11/04/2018  ? Procedure: BIV ICD  UPGRADE;  Surgeon: Evans Lance, MD;  Location: [place] [facility];  Service: Cardiovascular;  Laterality: N/A;  ? breast cystectomies    ? due to FCBD  ? BREAST MASS EXCISION  01/2010  ? Left-fibrocystic change w/intraductal papilloma, no malignancy  ? carotid US  79/8921  ? 1-94% LICA, normal RICA  ? CATARACT EXTRACTION W/PHACO Right 03/08/2021  ? Procedure: CATARACT EXTRACTION PHACO AND INTRAOCULAR LENS PLACEMENT ([place]) RIGHT;  Surgeon: Birder Robson, MD;  Location: [place];  Service: Ophthalmology;  Laterality: Right;  13.01 ?1:15.7  ? [place] Bunionectomy  11/04/08  ? Right Great Toe (Dr. Beola Cord)  ? COLONOSCOPY  01/2018  ? 1 small benign polyp, diverticulosis, healthy anastomosis [name] @ [facility])  ? COLOSTOMY N/A 10/02/2013  ? DESCENDING COLOSTOMY;  Surgeon: Imogene Burn. Wyatt, MD  ? COLOSTOMY REVERSAL N/A 02/10/2014  ? [name], MD  ? EMG/MCV  10/16/01  ? + mild carpal tunnel  ? ESOPHAGOGASTRODUODENOSCOPY  09/2014  ? small HH,  irregular Z line, fundic gland polyps with mild gastritis Olevia Perches)  ? Flex laryngoscopy  06/11/06  ? ([place]) nml  ? IMPLANTABLE CARDIOVERTER DEFIBRILLATOR IMPLANT N/A 07/14/2011  ? MDT single chamber ICD  ? KNEE ARTHROSCOPY W/ ORIF    ? Left; "meniscus tear"  ? LASIK    ? left eye  ? MANDIBLE SURGERY    ? "got about 6 pins in the bottom of my jaw"  ? NCS  11/2014  ? L ulnar neuropathy, mild B median nerve entrapment ([place])  ? OSTEOTOMY    ? Left foot  ? PARTIAL COLECTOMY N/A 10/02/2013  ? PARTIAL COLECTOMY;  Surgeon: Imogene Burn. Tsuei, MD  ?  RIGHT/LEFT HEART CATH AND CORONARY ANGIOGRAPHY N/A 10/16/2018  ? Procedure: RIGHT/LEFT HEART CATH AND CORONARY ANGIOGRAPHY;  Surgeon: Wellington Hampshire, MD;  Location: [place] [facility];  Service: Cardiovascular;  Laterality: N/A;  ? sinus surgery    ? x3 with balloon   ? TOE AMPUTATION    ? "toe beside baby toe on left foot; got gangrene from corn"  ? TONSILLECTOMY AND ADENOIDECTOMY    ? ? ?Current Medications: ?Current Meds  ?Medication Sig  ? acetaminophen (TYLENOL) 500 MG tablet Take 500 mg by mouth daily as needed for moderate pain or headache (PAIN).   ? AMBULATORY NON FORMULARY MEDICATION Medication Name:  1 squatty potty to use as needed  ? Ascorbic Acid (VITAMIN C) 1000 MG tablet Take 1,000 mg by mouth See admin instructions. Take 1000 mg daily, may take an additional 1000 mg as needed for cold symptoms  ? aspirin EC 81 MG tablet Take 81 mg by mouth at bedtime.   ? beclomethasone (QVAR) 80 MCG/ACT inhaler Inhale 2 puffs into the lungs 2 (two) times daily.  ? benzonatate (TESSALON) 200 MG capsule Take 1 capsule (200 mg total) by mouth 3 (three) times daily as needed for cough.  ? Biotin 10 MG CAPS Take 1 tablet by mouth daily.  ? carvedilol (COREG) 6.25 MG tablet TAKE 0.5 TABLETS (3.125 MG TOTAL) BY MOUTH 2 (TWO) TIMES DAILY WITH A MEAL.  ? Cholecalciferol (VITAMIN D3) 50 MCG (2000 UT) capsule Take 1 capsule (2,000 Units total) by mouth daily.  ? Coenzyme Q10 (COQ-10) 100 MG  CAPS Take 100 mg by mouth daily.  ? COLLAGEN PO Take by mouth daily.  ? dextromethorphan (DELSYM) 30 MG/5ML liquid Take 15 mg by mouth daily as needed for cough.   ? ezetimibe (ZETIA) 10 MG tablet TAKE 1 T

## 2021-09-16 LAB — ECHOCARDIOGRAM COMPLETE
AR max vel: 2.64 cm2
AV Area VTI: 2.65 cm2
AV Area mean vel: 2.29 cm2
AV Mean grad: 5 mmHg
AV Peak grad: 7.7 mmHg
AV Vena cont: 0.3 cm
Ao pk vel: 1.39 m/s
Area-P 1/2: 5.27 cm2
Calc EF: 29.8 %
Height: 65 in
MV M vel: 4.56 m/s
MV Peak grad: 83 mmHg
Radius: 0.5 cm
S' Lateral: 4.7 cm
Single Plane A2C EF: 32.2 %
Single Plane A4C EF: 21.7 %
Weight: 2960 oz

## 2021-09-19 DIAGNOSIS — Z20822 Contact with and (suspected) exposure to covid-19: Secondary | ICD-10-CM | POA: Diagnosis not present

## 2021-09-20 ENCOUNTER — Encounter: Payer: Self-pay | Admitting: Cardiovascular Disease

## 2021-09-21 ENCOUNTER — Encounter: Payer: Self-pay | Admitting: Pulmonary Disease

## 2021-09-21 ENCOUNTER — Ambulatory Visit (INDEPENDENT_AMBULATORY_CARE_PROVIDER_SITE_OTHER): Payer: Medicare Other | Admitting: Pulmonary Disease

## 2021-09-21 ENCOUNTER — Telehealth: Payer: Self-pay | Admitting: Cardiovascular Disease

## 2021-09-21 VITALS — BP 122/70 | HR 63 | Temp 97.6°F | Ht 65.0 in | Wt 182.6 lb

## 2021-09-21 DIAGNOSIS — R053 Chronic cough: Secondary | ICD-10-CM

## 2021-09-21 DIAGNOSIS — J4530 Mild persistent asthma, uncomplicated: Secondary | ICD-10-CM

## 2021-09-21 DIAGNOSIS — I420 Dilated cardiomyopathy: Secondary | ICD-10-CM

## 2021-09-21 NOTE — Progress Notes (Signed)
Subjective:    Patient ID: Ashley [name], female    DOB: Jun 13, 1945, 76 y.o.   MRN: [id number] Patient Care Team: [name], MD as PCP - General Marinus Maw, MD as PCP - Electrophysiology (Cardiology) [name], MD as Referring Physician (Family Medicine) Hart Carwin, MD (Inactive) as Consulting Physician (Gastroenterology) Marinus Maw, MD as Consulting Physician (Cardiology) Antonieta Iba, MD as Consulting Physician (Cardiology) Phil Dopp, [facility] as Pharmacist (Pharmacist)  Chief Complaint  Patient presents with   Follow-up    SOB, prod cough with clear sputum and wheezing.     HPI Patient is a 76 year old who follows here for the issue of chronic cough. She has asthma and allergic rhinitis. History significant for nonischemic cardiomyopathy, IgM lambda monoclonal gammopathy as well. Patient is on Qvar for management of her asthma symptoms. She endorses multiple sensitivities to different inhalers and Qvar appears to be what she can tolerate. She uses Xopenex for rescue as needed.  She has noted some increase in her cough over the last several days.  Has not had any fevers, chills or sweats.  It has been difficult to treat her due to her perceived intolerances of various medications. She is on Entresto for her heart failure and unfortunately this will also have the side effect of cough. However she does require the Entresto due to her severe cardiomyopathy. She notes occasional wheezing, has not had any fevers, chills or sweats since discharge.   As noted her cardiomyopathy is quite severe she had echocardiogram performed 15 Sep 2021 and this shows EF of 25 to 30%.  TEST/EVENTS :  11/04/2019 PFTs: FEV1 was 1.97 L or 88% predicted, FVC C was 2.72 L or 91% predicted, FEV1/FVC was 72%.  There was no bronchodilator response.  ERV was 12% consistent with obesity. There was no overt air trapping or hyperinflation (my review).  Flow volume loop was normal.   Diffusion capacity normal.  Normal PFTs for age.  CT chest angio December 02, 2020 negative for PE mild atelectasis and trace pleural effusion on the right.  Bronchomalacia with intermittent airway collapse    Review of Systems A 10 point review of systems was performed and it is as noted above otherwise negative.  Patient Active Problem List   Diagnosis Date Noted   Severe mitral insufficiency 03/25/2022   Acute on chronic systolic heart failure, NYHA class 3 (HCC) 03/23/2022   Congestive heart failure (HCC) 04/05/2022   Insomnia 03/25/2022   Dry mouth 01/14/2022   CKD (chronic kidney disease) stage 3, GFR 30-59 ml/min (HCC) 01/14/2022   Pneumonia 01/04/2022   Paroxysmal atrial fibrillation (HCC) 01/04/2022   Renal lesion 10/14/2021   Diverticulitis of large intestine without perforation or abscess without bleeding 07/12/2021   Statin intolerance 04/16/2021   Hair loss 04/16/2021   Dizziness 02/07/2021   Nocturnal hypoxia 01/01/2021   Acute respiratory failure with hypoxia (HCC) 12/02/2020   Bilateral flank pain 10/15/2020   Cervical pain (neck) 02/04/2019   Acute on chronic combined systolic and diastolic CHF (congestive heart failure) (HCC) 10/14/2018   Bilateral leg numbness 06/01/2018   Mixed hyperlipidemia 01/17/2018   Plantar fasciitis, bilateral 12/13/2017   Acquired renal cyst of right kidney 08/11/2017   Unsteadiness on feet 07/24/2017   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   High total serum IgM 11/22/2016   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   Bleeding external hemorrhoids 10/31/2016   LBBB (left bundle branch block) 07/11/2016   Gastritis 06/12/2016   Fecal incontinence  Chronic generalized abdominal pain 11/22/2015   Abdominal aortic atherosclerosis (HCC) 11/13/2015   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   Hoarseness 06/21/2015   Diarrhea 06/21/2015   Hypersensitivity to pneumococcal vaccine 06/10/2015   Medicare annual wellness visit, subsequent 06/08/2015   Advanced care  planning/counseling discussion 06/08/2015   Intertrigo 04/06/2015   Nonintractable episodic headache 04/06/2015   Peripheral neuropathy 04/06/2015   Essential hypertension    Generalized abdominal pain 05/28/2014   S/P colostomy takedown 02/10/2014   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 10/23/2013   H/O multiple allergies 10/10/2013   History of diverticular abscess 09/25/2013   Osteoporosis 01/13/2013   Irregular heart beat 08/06/2012   MDD (major depressive disorder), recurrent episode, moderate (HCC) 07/09/2012   Chronic recurrent sinusitis 06/17/2012   Vitamin D deficiency 03/05/2012   Essential tremor 12/29/2011   GERD (gastroesophageal reflux disease) 11/13/2011   Allergic rhinitis 11/13/2011   Chronic respiratory failure with hypoxia (HCC) 07/31/2011   Dilated cardiomyopathy (HCC) 03/01/2011   Nonrheumatic mitral (valve) insufficiency 02/24/2011   Chronic combined systolic and diastolic CHF (congestive heart failure) (HCC) 02/24/2011   Chronic bronchitis (HCC) 01/20/2011   Syncope 11/02/2010   Chronic fatigue and malaise 11/02/2010   Anxiety state 08/18/2010   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   Irritable bowel syndrome 11/22/2006   FIBROCYSTIC BREAST DISEASE 11/22/2006   Osteoarthritis 11/22/2006   Fibromyalgia 11/22/2006   Social History   Tobacco Use   Smoking status: Never   Smokeless tobacco: Never   Tobacco comments:    Lived with smokers x 23 yrs, smoked herself "for a week"  Substance Use Topics   Alcohol use: No    Alcohol/week: 0.0 standard drinks of alcohol   Allergies  Allergen Reactions   Chlorhexidine Other (See Comments)    blisters   Codeine Other (See Comments)    Confusion and dizziness Makes pain level worse   Cyclobenzaprine Other (See Comments)    Adverse reaction - back and throat pain, dizziness, exhaustion   Cymbalta [Duloxetine Hcl] Other (See Comments)    "body spasms and made me feel weird in  my head"    Diflucan [Fluconazole] Nausea And Vomiting   Dilaudid [Hydromorphone Hcl] Other (See Comments)    Feels like she is burning internally    Influenza Vac Split [Influenza Virus Vaccine] Other (See Comments)    "allergic to concentrated eggs that are in vaccine"   Lasix [Furosemide] Other (See Comments)    Severe hypotension on lasix 40 PO (can handle 20 mg oral dosing) 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  Can take a different amount   Latex Nausea Only, Rash and Other (See Comments)    Pain and infection   Lipitor [Atorvastatin] Other (See Comments)    Severe muscle cramps to generic atorvastatin.  Able to take brand lipitor.   Morphine And Related Other (See Comments)    Internally feels like she is on fire    Pravastatin Other (See Comments)    myalgias   Prevacid [Lansoprazole] Other (See Comments)    Worsened GI side effects   Prevnar [Pneumococcal 13-Val Conj Vacc] Other (See Comments)    Egg allergy Bad reaction after shot   Rosuvastatin Other (See Comments)    Was not effective controlling lipids   Simvastatin Other (See Comments)    Leg cramps   Tape Other (See Comments) and Rash    All adhesives  - Blisters, itching and burning  blistering   Farxiga [Dapagliflozin] Other (See Comments)  Unknown reaction    Metaxalone Other (See Comments)    Unknown reaction    Tramadol     Leg cramps   Antihistamines, Chlorpheniramine-Type Other (See Comments)    Alters vision   Betadine [Povidone Iodine] Rash    Per pt.    Onion Diarrhea and Other (See Comments)    GI distress, flared IBS   Other Hives and Rash    NOTE: pt is able to take cephalosporins without reaction   Penicillins Other (See Comments)    CHILDHOOD ALLEGY/REACTION: "told 50 years ago I couldn't take it; might be immune to it", able to take amoxicillin Has patient had a PCN reaction causing immediate rash, facial/tongue/throat swelling, SOB or lightheadedness with hypotension: Unknown Has patient had a PCN  reaction causing severe rash involving mucus membranes or skin necrosis: UnknowN Has patient had a PCN reaction that required hospitalization: /Unknown Has patient had a PCN reaction occurring within the last 10 years: Unknown If a   Red Dye Nausea Only and Swelling   Sertraline Other (See Comments)    Overly sedating   Sulfasalazine Other (See Comments)    "makes my whole body smell like sulfa & makes me sick just smelling it"   Medications reviewed with patient.  Immunization History  Administered Date(s) Administered   Pneumococcal Conjugate-13 06/08/2015   Pneumococcal Polysaccharide-23 02/16/2012   Td 11/12/2005        Objective:   Physical Exam BP 122/70 (BP Location: Left Arm, Cuff Size: Normal)   Pulse 63   Temp 97.6 F (36.4 C) (Temporal)   Ht 5\' 5"  (1.651 m)   Wt 182 lb 9.6 oz (82.8 kg)   SpO2 96%   BMI 30.39 kg/m  GENERAL: Obese woman, no acute distress.  Fully ambulatory.  No conversational dyspnea. HEAD: Normocephalic, atraumatic.  EYES: Pupils equal, round, reactive to light.  No scleral icterus.  MOUTH: Masking requirements. NECK: Supple. No thyromegaly. Trachea midline. No JVD.  No adenopathy. PULMONARY: Good air entry bilaterally.  No adventitious sounds. CARDIOVASCULAR: S1 and S2. Regular rate and rhythm.  No rubs, murmurs or gallops heard. ABDOMEN: Protuberant, otherwise benign. MUSCULOSKELETAL: No joint deformity, no clubbing, no edema.  NEUROLOGIC: No focal deficit, no gait disturbance, speech is fluent. SKIN: Intact,warm,dry.  No rashes noted. PSYCH: Mood and behavior normal.      Assessment & Plan:     ICD-10-CM   1. Mild persistent asthma, unspecified whether complicated  J45.30    Continue Qvar Increase to 2 puffs twice a day until cough subsides May decrease to 1 puff twice a day once cough controlled    2. Chronic cough  R05.3    Chronic On Entresto Patient needs Entresto and benefits outweigh cough symptom    3. Cardiomyopathy,  dilated, nonischemic (HCC)  I42.0    Severe with LVEF around 25% This issue adds complexity to her management Follows with cardiology     Patient will see [place] in follow-up in 2 to 3 months time call sooner should any new problems arise.  Gailen Shelter, MD Advanced Bronchoscopy PCCM [facility]    *This note was dictated using voice recognition software/Dragon.  Despite best efforts to proofread, errors can occur which can change the meaning. Any transcriptional errors that result from this process are unintentional and may not be fully corrected at the time of dictation.

## 2021-09-21 NOTE — Patient Instructions (Signed)
Please use the Qvar 1 puff twice a day at least steadily for the next 2 to 4 weeks to see if we can get the inflammation in your airway down. ? ?Continue using your rescue inhaler as needed.  I am hoping that with the increase Qvar this will help decrease the amount of rescue inhaler you are using. ? ?We will see you in follow-up in 2 to 3 months time call sooner should any new problems arise. ?

## 2021-09-21 NOTE — Telephone Encounter (Signed)
error 

## 2021-09-28 DIAGNOSIS — I420 Dilated cardiomyopathy: Secondary | ICD-10-CM | POA: Diagnosis not present

## 2021-09-28 DIAGNOSIS — D472 Monoclonal gammopathy: Secondary | ICD-10-CM | POA: Diagnosis not present

## 2021-09-28 DIAGNOSIS — F331 Major depressive disorder, recurrent, moderate: Secondary | ICD-10-CM | POA: Diagnosis not present

## 2021-09-30 DIAGNOSIS — H2512 Age-related nuclear cataract, left eye: Secondary | ICD-10-CM | POA: Diagnosis not present

## 2021-09-30 DIAGNOSIS — M3501 Sicca syndrome with keratoconjunctivitis: Secondary | ICD-10-CM | POA: Diagnosis not present

## 2021-09-30 DIAGNOSIS — H04222 Epiphora due to insufficient drainage, left lacrimal gland: Secondary | ICD-10-CM | POA: Diagnosis not present
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
## 2021-10-03 ENCOUNTER — Ambulatory Visit (INDEPENDENT_AMBULATORY_CARE_PROVIDER_SITE_OTHER): Payer: Medicare Other | Admitting: Cardiovascular Disease

## 2021-10-03 ENCOUNTER — Encounter: Payer: Self-pay | Admitting: Cardiovascular Disease

## 2021-10-03 VITALS — BP 90/60 | HR 79 | Ht 65.0 in | Wt 182.2 lb

## 2021-10-03 DIAGNOSIS — I420 Dilated cardiomyopathy: Secondary | ICD-10-CM

## 2021-10-03 DIAGNOSIS — Z95810 Presence of automatic (implantable) cardiac defibrillator: Secondary | ICD-10-CM | POA: Diagnosis not present

## 2021-10-03 DIAGNOSIS — J9611 Chronic respiratory failure with hypoxia: Secondary | ICD-10-CM

## 2021-10-03 DIAGNOSIS — I5022 Chronic systolic (congestive) heart failure: Secondary | ICD-10-CM | POA: Diagnosis not present

## 2021-10-03 DIAGNOSIS — E782 Mixed hyperlipidemia: Secondary | ICD-10-CM

## 2021-10-03 DIAGNOSIS — I700 Atherosclerosis of aorta: Secondary | ICD-10-CM

## 2021-10-03 DIAGNOSIS — I051 Rheumatic mitral insufficiency: Secondary | ICD-10-CM

## 2021-10-03 NOTE — Patient Instructions (Addendum)
Medication Instructions:  Please continue torsemide 10 mg twice a day every other day, potassium when you take torsemide  If you need a refill on your cardiac medications before your next appointment, please call your pharmacy.   Lab work:  [employer] in 1 month  Nature conservation officer at [facility] 1st desk on the right to check in (REGISTRATION)  Lab hours: Monday- Friday (7:30 am- 5:30 pm)   Testing/Procedures: No new testing needed  Follow-Up: At [facility], you and your health needs are our priority.  As part of our continuing mission to provide you with exceptional heart care, we have created designated Provider Care Teams.  These Care Teams include your primary Cardiologist (physician) and Advanced Practice Providers (APPs -  Physician Assistants and Nurse Practitioners) who all work together to provide you with the care you need, when you need it.  You will need a follow up appointment in 3 months APP   Providers on your designated Care Team:   Murray Hodgkins, NP Christell Faith, PA-C Cadence Kathlen Mody, [place]  COVID-19 Vaccine Information can be found at: ShippingScam.co.uk For questions related to vaccine distribution or appointments, please email [email or web address] or call [phone].

## 2021-10-14 ENCOUNTER — Encounter: Payer: Self-pay | Admitting: Family Medicine

## 2021-10-14 ENCOUNTER — Ambulatory Visit (INDEPENDENT_AMBULATORY_CARE_PROVIDER_SITE_OTHER): Payer: Medicare Other | Admitting: Family Medicine

## 2021-10-14 VITALS — BP 124/74 | HR 90 | Temp 97.7°F | Ht 65.0 in | Wt 185.4 lb

## 2021-10-14 DIAGNOSIS — I5022 Chronic systolic (congestive) heart failure: Secondary | ICD-10-CM | POA: Diagnosis not present

## 2021-10-14 DIAGNOSIS — R5382 Chronic fatigue, unspecified: Secondary | ICD-10-CM

## 2021-10-14 DIAGNOSIS — K582 Mixed irritable bowel syndrome: Secondary | ICD-10-CM | POA: Diagnosis not present

## 2021-10-14 DIAGNOSIS — N1831 Chronic kidney disease, stage 3a: Secondary | ICD-10-CM | POA: Diagnosis not present

## 2021-10-14 DIAGNOSIS — R1084 Generalized abdominal pain: Secondary | ICD-10-CM | POA: Diagnosis not present

## 2021-10-14 DIAGNOSIS — Z95810 Presence of automatic (implantable) cardiac defibrillator: Secondary | ICD-10-CM | POA: Diagnosis not present

## 2021-10-14 DIAGNOSIS — R768 Other specified abnormal immunological findings in serum: Secondary | ICD-10-CM

## 2021-10-14 DIAGNOSIS — R5381 Other malaise: Secondary | ICD-10-CM | POA: Diagnosis not present

## 2021-10-14 DIAGNOSIS — G8929 Other chronic pain: Secondary | ICD-10-CM | POA: Diagnosis not present

## 2021-10-14 DIAGNOSIS — N289 Disorder of kidney and ureter, unspecified: Secondary | ICD-10-CM

## 2021-10-14 DIAGNOSIS — K5732 Diverticulitis of large intestine without perforation or abscess without bleeding: Secondary | ICD-10-CM | POA: Diagnosis not present

## 2021-10-14 DIAGNOSIS — M797 Fibromyalgia: Secondary | ICD-10-CM | POA: Diagnosis not present

## 2021-10-14 DIAGNOSIS — R7689 Other specified abnormal immunological findings in serum: Secondary | ICD-10-CM

## 2021-10-14 LAB — BASIC METABOLIC PANEL
BUN: 23 mg/dL (ref 6–23)
CO2: 27 mEq/L (ref 19–32)
Calcium: 9.8 mg/dL (ref 8.4–10.5)
Chloride: 105 mEq/L (ref 96–112)
Creatinine, Ser: 0.96 mg/dL (ref 0.40–1.20)
GFR: 57.58 mL/min — ABNORMAL LOW (ref >60.00)
Glucose, Bld: 89 mg/dL (ref 70–99)
Potassium: 4.2 mEq/L (ref 3.5–5.1)
Sodium: 140 mEq/L (ref 135–145)

## 2021-10-14 MED ORDER — NYSTATIN 100000 UNIT/GM EX CREA: 1.0000 "application " | TOPICAL_CREAM | CUTANEOUS | 3 refills | Status: AC | PRN

## 2021-10-14 NOTE — Progress Notes (Signed)
Patient ID: Ashley [name], female    DOB: [birth year], 76 y.o.   MRN: [id number]  This visit was conducted in person.  BP 124/74   Pulse 90   Temp 97.7 F (36.5 C) (Temporal)   Ht '5\' 5"'$  (1.651 m)   Wt 185 lb 6 oz (84.1 kg)   SpO2 97%   BMI 30.85 kg/m    CC: 6 mo f/u visit  Subjective:   HPI: Ashley [name] is a 76 y.o. female presenting on 10/14/2021 for Follow-up (Here for 6 mo f/u.)   ER visit 06/2021 with dx diverticulitis along with large supraumbilical ventral wall hernia containing small bowel and transverse colon. This was treated with bland diet and bowel rest. For incidental 1.4cm R renal lesion - radiology recommended CT abd with/without contrast in 6 months (12/2021) (as MRI limited by pacemaker status).   Saw GI Dr Carlean Purl (established care with him) - rec against colonoscopy, but rather rpt CT scan - which returned reassuring. She continues miralax and prunes PRN, did not try fleet enema. She had difficulty with repeat CT scan with oral and IV contrast.   Chronic dyspnea, fatigue, nausea, attributes to GI issues.   Saw hematologist Dr Dionne Milo at 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  Also saw pulm and cards in follow up in the past month. Was recommended to start torsemide '10mg'$  daily, '20mg'$  MWF along with potassium 71mq with each torsemide pill. Rec update kidney function with recent torsemide use and contrast use.  Sees [facility] Dr BGloriann Loan- recommended rpt CT to monitor kidney spot 12/08/2021.   Her pacemaker/defibrillator should be safe for MRI based on serial #, however she wants to avoid MRI - was told in 2013 not to get MRI.   Requests nystatin cream refilled for ongoing yeast infection to inguinal region.   Asks about Cologuard.      Relevant past medical, surgical, family and social history reviewed and updated as indicated. Interim medical history since our last visit  reviewed. Allergies and medications reviewed and updated. Outpatient Medications Prior to Visit  Medication Sig Dispense Refill   acetaminophen (TYLENOL) 500 MG tablet Take 500 mg by mouth daily as needed for moderate pain or headache (PAIN).      AMBULATORY NON FORMULARY MEDICATION Medication Name:  1 squatty potty to use as needed 1 each 0   Ascorbic Acid (VITAMIN C) 1000 MG tablet Take 1,000 mg by mouth See admin instructions. Take 1000 mg daily, may take an additional 1000 mg as needed for cold symptoms     aspirin EC 81 MG tablet Take 81 mg by mouth at bedtime.      beclomethasone (QVAR) 80 MCG/ACT inhaler Inhale 2 puffs into the lungs 2 (two) times daily. 1 each 5   benzonatate (TESSALON) 200 MG capsule Take 1 capsule (200 mg total) by mouth 3 (three) times daily as needed for cough. 30 capsule 6   Biotin 10 MG CAPS Take 1 tablet by mouth daily.     carvedilol (COREG) 6.25 MG tablet TAKE 0.5 TABLETS (3.125 MG TOTAL) BY MOUTH 2 (TWO) TIMES DAILY WITH A MEAL. 90 tablet 0   Cholecalciferol (VITAMIN D3) 50 MCG (2000 UT) capsule Take 1 capsule (2,000 Units total) by mouth daily.     Coenzyme Q10 (COQ-10) 100 MG CAPS Take 100 mg by mouth daily.     COLLAGEN PO Take by mouth daily.  dextromethorphan (DELSYM) 30 MG/5ML liquid Take 15 mg by mouth daily as needed for cough.      ezetimibe (ZETIA) 10 MG tablet TAKE 1 TABLET BY MOUTH EVERY DAY 90 tablet 3   GARLIC PO Take 1 capsule by mouth daily.     guaiFENesin (MUCINEX) 600 MG 12 hr tablet Take 600 mg by mouth 2 (two) times daily.     levalbuterol (XOPENEX HFA) 45 MCG/ACT inhaler Inhale 2 puffs into the lungs every 6 (six) hours as needed for wheezing or shortness of breath. 15 g 3   LORazepam (ATIVAN) 0.5 MG tablet Take 0.5-1 tablets (0.25-0.5 mg total) by mouth at bedtime as needed for anxiety or sleep. 30 tablet 1   magnesium 30 MG tablet Take 30 mg by mouth daily.     Menthol, Topical Analgesic, (BIOFREEZE ROLL-ON EX) Apply topically as  needed.     miconazole (MICOTIN) 2 % powder Apply 1 application topically as needed (yeast).     Omega-3 Fatty Acids (FISH OIL) 1000 MG CAPS Take 1 capsule (1,000 mg total) by mouth daily.  0   OXYGEN Inhale 2 L into the lungs at bedtime.     potassium chloride (KLOR-CON) 10 MEQ tablet Take 1 tablet (10 mEq total) by mouth 3 (three) times a week. 40 tablet 1   pyridOXINE (VITAMIN B-6) 100 MG tablet Take 100 mg by mouth daily.     Red Yeast Rice Extract (RED YEAST RICE PO) Take 1 capsule by mouth in the morning and at bedtime.     sacubitril-valsartan (ENTRESTO) 49-51 MG Take 1 tablet by mouth daily. 30 tablet 6   spironolactone (ALDACTONE) 25 MG tablet Take 1 tablet (25 mg total) by mouth daily. 90 tablet 1   torsemide (DEMADEX) 10 MG tablet Take 10 mg by mouth as needed.     vitamin E 400 UNIT capsule Take 400 Units by mouth daily.     nystatin cream (MYCOSTATIN) Apply 1 application topically as needed for dry skin (yeast infection). 30 g 3   No facility-administered medications prior to visit.     Per HPI unless specifically indicated in ROS section below Review of Systems  Objective:  BP 124/74   Pulse 90   Temp 97.7 F (36.5 C) (Temporal)   Ht '5\' 5"'$  (1.651 m)   Wt 185 lb 6 oz (84.1 kg)   SpO2 97%   BMI 30.85 kg/m   Wt Readings from Last 3 Encounters:  10/14/21 185 lb 6 oz (84.1 kg)  10/03/21 182 lb 4 oz (82.7 kg)  09/21/21 182 lb 9.6 oz (82.8 kg)      Physical Exam Vitals and nursing note reviewed.  Constitutional:      Appearance: Normal appearance. She is not ill-appearing.  Cardiovascular:     Rate and Rhythm: Normal rate and regular rhythm.     Pulses: Normal pulses.     Heart sounds: Normal heart sounds. No murmur heard. Pulmonary:     Effort: Pulmonary effort is normal. No respiratory distress.     Breath sounds: Normal breath sounds. No wheezing, rhonchi or rales.  Musculoskeletal:     Right lower leg: No edema.     Left lower leg: No edema.  Skin:     General: Skin is warm and dry.     Findings: No rash.  Neurological:     Mental Status: She is alert.  Psychiatric:        Mood and Affect: Mood normal.  Behavior: Behavior normal.      Results for orders placed or performed in visit on 09/15/21  ECHOCARDIOGRAM COMPLETE  Result Value Ref Range   Weight 2,960 oz   Height 65 in   BP 120/60 mmHg   AR max vel 2.64 cm2   AV Peak grad 7.7 mmHg   Ao pk vel 1.39 m/s   S' Lateral 4.70 cm   Area-P 1/2 5.27 cm2   AV Area VTI 2.65 cm2   AV Mean grad 5.0 mmHg   Single Plane A4C EF 21.7 %   Single Plane A2C EF 32.2 %   Calc EF 29.8 %   AV Area mean vel 2.29 cm2   MV M vel 4.56 m/s   MV Peak grad 83.0 mmHg   Radius 0.50 cm   AV Vena cont 0.30 cm   *Note: Due to a large number of results and/or encounters for the requested time period, some results have not been displayed. A complete set of results can be found in Results Review.    Assessment & Plan:   Problem List Items Addressed This Visit     Irritable bowel syndrome   Fibromyalgia   Chronic fatigue and malaise   Chronic systolic CHF (congestive heart failure) ([place])    She is now taking torsemide '10mg'$  daily, with '20mg'$  MWF, along with potassium. Update BMP.        CKD (chronic kidney disease) stage 3, GFR 30-59 ml/min (HCC) - Primary    Update BMP in h/o recent CT contrast and increased torsemide use.        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        Diverticulitis of large intestine without perforation or abscess without bleeding    Rpt CT showed resolution of 06/2021 diverticulitis.        Renal lesion    Has established with urology (Dr Gloriann Loan), will request latest records. Planned rpt CT 11/2021.           Meds ordered this encounter  Medications   nystatin cream  (MYCOSTATIN)    Sig: Apply 1 application. topically as needed for dry skin (yeast infection).    Dispense:  30 g    Refill:  3   Orders Placed This Encounter  Procedures   Basic metabolic panel    Patient Instructions  We will request urology records (Dr Gloriann Loan at [facility] urology).  Labs today.  Return as needed or in 6 months for wellness visit.   Follow up plan: Return in about 6 months (around 04/15/2022) for medicare wellness visit.  [name], MD

## 2021-10-14 NOTE — Assessment & Plan Note (Signed)
Rpt CT showed resolution of 06/2021 diverticulitis.

## 2021-10-14 NOTE — Assessment & Plan Note (Signed)
Has established with urology (Dr Gloriann Loan), will request latest records. Planned rpt CT 11/2021.

## 2021-10-14 NOTE — Patient Instructions (Addendum)
We will request urology records (Dr Gloriann Loan at [facility] urology).  Labs today.  Return as needed or in 6 months for wellness visit.

## 2021-10-14 NOTE — Assessment & Plan Note (Signed)
She is now taking torsemide '10mg'$  daily, with '20mg'$  MWF, along with potassium. Update BMP.

## 2021-10-14 NOTE — Assessment & Plan Note (Signed)
Update BMP in h/o recent CT contrast and increased torsemide use.

## 2021-10-14 NOTE — Assessment & Plan Note (Signed)
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

## 2021-10-17 ENCOUNTER — Other Ambulatory Visit: Payer: Self-pay | Admitting: Nurse Practitioner

## 2021-10-28 DIAGNOSIS — R2232 Localized swelling, mass and lump, left upper limb: Secondary | ICD-10-CM | POA: Diagnosis not present

## 2021-11-08 ENCOUNTER — Telehealth: Payer: Self-pay | Admitting: Internal Medicine

## 2021-11-11 DIAGNOSIS — R2232 Localized swelling, mass and lump, left upper limb: Secondary | ICD-10-CM | POA: Diagnosis not present

## 2021-11-14 ENCOUNTER — Telehealth: Payer: Self-pay

## 2021-11-14 NOTE — Progress Notes (Signed)
Chronic Care Management Pharmacy Assistant   Name: Ashley [name]  MRN: [id number] DOB: [birth year]  Reason for Encounter: CCM (General Adherence)  Recent office visits:  10/14/21 [name], MD Stage 3 CKD No med changes FU 6 months 07/12/21 [name], MD Diverticulitis Referral to Dr. [name] at [facility] No med changes 04/15/21 - [name], MD - Pt presented for AWV. Increase vitamin D3 to 2000 IU daily. LDL elevated, discussed lifestyle management. Hair loss, will update iron panel. Follow up 6 months.   Recent consult visits:  10/28/21 [place] [facility]) Cyst of finer 10/03/21 [name], MD (Cardiology) CHF No med changes FU 3 months 09/28/21 Rondel Baton, MD (Hematology) Cardiomyopathy No medication changes 09/21/21 Vernard Gambles, MD (Pulmonology) Change: Qvar 1 puff twice a day for 2 - 4 weeks to get the inflammation in airway down. FU 2- 3 months 09/15/21 Cadence Kathlen Mody, PA (Cardiology) Shortness of breath Stop (change) Torsemide 10 mg daily for two weeks Stop (completed) Fluorouracil 5 % Stop (completed) Triamcinolone Acetonide 55 mcg 09/05/21 Silvano Rusk, MD [facility]) Message regarding liver lesions "The abnormalities described in liver on recent CT are stable and tiny and most likely represent cysts. Their size precludes definitive diagnosis and the stability (lack of growth) indicates a benign issue. The radiologist did not recommend any follow-up testing so that is good news." 08/30/21 CT Abdomen Pelvis 08/17/21 Silvano Rusk, MD [facility]) Diverticulitis Ordered: BUN and CT Abdomen Stop (patient) Pantoprazole 40 mg. Start: OTC Fleet enema every other day.  06/30/21 [facility] Abdominal Pain - Advised to go to ED 06/27/21 Noel Christmas (Internal Medicine) Covid 19 06/06/21 Marinus Maw, [place] (Cardiology) CHF No med changes FU 6 months 05/26/21 Marinus Maw, PA (Cardiology) NICM Ordered: CUP PACEART Device Check and EKG No med changes. FU 2  weeks.  05/19/21 - Telephone note -  Pt denied for [employer] assistance program. 04/11/21 - Cardiology - Pt presented for 3 month follow up visit, CHF, cardiomyopathy, syncope. Continue current medications. EKG ordered.  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    AMBULATORY NON FORMULARY MEDICATION Medication Name:  1 squatty potty to use as needed   Ascorbic Acid (VITAMIN C) 1000 MG tablet Take 1,000 mg by mouth See admin instructions. Take 1000 mg daily, may take an additional 1000 mg as needed for cold symptoms   aspirin EC 81 MG tablet Take 81 mg by mouth at bedtime.    beclomethasone (QVAR) 80 MCG/ACT inhaler Inhale 2 puffs into the lungs 2 (two) times daily.   benzonatate (TESSALON) 200 MG capsule Take 1 capsule (200 mg total) by mouth 3 (three) times daily as needed for cough.   Biotin 10 MG CAPS Take 1 tablet by mouth daily.   carvedilol (COREG) 6.25 MG tablet TAKE 0.5 TABLETS (3.125 MG TOTAL) BY MOUTH 2 (TWO) TIMES DAILY WITH A MEAL.   Cholecalciferol (VITAMIN D3) 50 MCG (2000 UT) capsule Take 1 capsule (2,000 Units total) by mouth daily.   Coenzyme Q10 (COQ-10) 100  MG CAPS Take 100 mg by mouth daily.   COLLAGEN PO Take by mouth daily.   dextromethorphan (DELSYM) 30 MG/5ML liquid Take 15 mg by mouth daily as needed for cough.    ezetimibe (ZETIA) 10 MG tablet TAKE 1 TABLET BY MOUTH EVERY DAY   GARLIC PO Take 1 capsule by mouth daily.    guaiFENesin (MUCINEX) 600 MG 12 hr tablet Take 600 mg by mouth 2 (two) times daily.   levalbuterol (XOPENEX HFA) 45 MCG/ACT inhaler Inhale 2 puffs into the lungs every 6 (six) hours as needed for wheezing or shortness of breath.   LORazepam (ATIVAN) 0.5 MG tablet Take 0.5-1 tablets (0.25-0.5 mg total) by mouth at bedtime as needed for anxiety or sleep.   magnesium 30 MG tablet Take 30 mg by mouth daily.   Menthol, Topical Analgesic, (BIOFREEZE ROLL-ON EX) Apply topically as needed.   miconazole (MICOTIN) 2 % powder Apply 1 application topically as needed (yeast).   nystatin cream (MYCOSTATIN) Apply 1 application. topically as needed for dry skin (yeast infection).   Omega-3 Fatty Acids (FISH OIL) 1000 MG CAPS Take 1 capsule (1,000 mg total) by mouth daily.   OXYGEN Inhale 2 L into the lungs at bedtime.   potassium chloride (KLOR-CON) 10 MEQ tablet Take 1 tablet (10 mEq total) by mouth 3 (three) times a week.   pyridOXINE (VITAMIN B-6) 100 MG tablet Take 100 mg by mouth daily.   Red Yeast Rice Extract (RED YEAST RICE PO) Take 1 capsule by mouth in the morning and at bedtime.   sacubitril-valsartan (ENTRESTO) 49-51 MG Take 1 tablet by mouth daily.   spironolactone (ALDACTONE) 25 MG tablet Take 1 tablet (25 mg total) by mouth daily.   torsemide (DEMADEX) 10 MG tablet Take 10 mg by mouth as needed.   vitamin E 400 UNIT capsule Take 400 Units by mouth daily.   [DISCONTINUED] bisoprolol (ZEBETA) 5 MG tablet Take 5 mg by mouth daily.   No facility-administered encounter medications on file as of 11/14/2021.   [place] on 11/14/2021 for general disease state and medication adherence call.   Patient is not more than 5 days past due for refill on the following medications per chart history:  [facility] Medications: Medication Name/mg  Last Fill Days Supply Sacubitril-Valsartan 49-51 mg 10/01/21 90  What concerns do you have about your medications? None  The patient denies side  effects with their medications.   How often do you forget or accidentally miss a dose? Never  Do you use a pillbox? Yes  Are you having any problems getting your medications from your pharmacy? No  Has the cost of your medications been a concern? No  Since last visit with CPP, the following interventions have been made.  Change: Qvar 1 puff twice a day for 2 - 4 weeks to get the inflammation in airway down  The patient has had an ED visit since last contact.   The patient reports the following problems with their health. Patient states she is still feeling the same. Every day her heart or her gut is bother her. Stated she is seeing doctors for it so there is nothing we can do and she doesn't know what to tell us. She is doing what they say and taking her medication so there is nothing else to say.   Patient denies concerns or questions for Charlene Brooke, PharmD at this time.   Care Gaps: Annual wellness visit in last year? Yes 05/03/2021 Most Recent  BP reading: 124/74 on 10/14/2021  Upcoming appointments: Cardiology appointment on 01/03/2022 CCM appointment on 05/24/2021  Charlene Brooke, CPP notified  Marijean Niemann, [place] Assistant [phone]

## 2021-11-16 ENCOUNTER — Ambulatory Visit (INDEPENDENT_AMBULATORY_CARE_PROVIDER_SITE_OTHER): Payer: Medicare Other

## 2021-11-16 DIAGNOSIS — I420 Dilated cardiomyopathy: Secondary | ICD-10-CM

## 2021-11-21 LAB — CUP PACEART REMOTE DEVICE CHECK
Battery Remaining Longevity: 34 mo
Battery Voltage: 2.94 V
Brady Statistic AP VP Percent: 0.06 %
Brady Statistic AP VS Percent: 0.04 %
Brady Statistic AS VP Percent: 97.4 %
Brady Statistic AS VS Percent: 2.49 %
Brady Statistic RA Percent Paced: 0.1 %
Brady Statistic RV Percent Paced: 96.88 %
Date Time Interrogation Session: 20230710112804
HighPow Impedance: 65 Ohm
Implantable Lead Implant Date: 20130301
Implantable Lead Implant Date: 20200622
Implantable Lead Implant Date: 20200622
Implantable Lead Location: 753859
Implantable Lead Location: 753860
Implantable Lead Location: 753860
Implantable Lead Model: 3830
Implantable Lead Model: 5076
Implantable Lead Model: 6935
Implantable Pulse Generator Implant Date: 20200622
Lead Channel Impedance Value: 323 Ohm
Lead Channel Impedance Value: 342 Ohm
Lead Channel Impedance Value: 380 Ohm
Lead Channel Impedance Value: 380 Ohm
Lead Channel Impedance Value: 399 Ohm
Lead Channel Impedance Value: 589 Ohm
Lead Channel Pacing Threshold Amplitude: 0.375 V
Lead Channel Pacing Threshold Amplitude: 0.875 V
Lead Channel Pacing Threshold Amplitude: 1.375 V
Lead Channel Pacing Threshold Pulse Width: 0.4 ms
Lead Channel Pacing Threshold Pulse Width: 0.4 ms
Lead Channel Pacing Threshold Pulse Width: 0.4 ms
Lead Channel Sensing Intrinsic Amplitude: 1.75 mV
Lead Channel Sensing Intrinsic Amplitude: 1.75 mV
Lead Channel Sensing Intrinsic Amplitude: 7 mV
Lead Channel Sensing Intrinsic Amplitude: 7 mV
Lead Channel Setting Pacing Amplitude: 1.5 V
Lead Channel Setting Pacing Amplitude: 2 V
Lead Channel Setting Pacing Amplitude: 2.5 V
Lead Channel Setting Pacing Pulse Width: 0.4 ms
Lead Channel Setting Pacing Pulse Width: 0.8 ms
Lead Channel Setting Sensing Sensitivity: 0.3 mV

## 2021-11-25 DIAGNOSIS — M7061 Trochanteric bursitis, right hip: Secondary | ICD-10-CM | POA: Diagnosis not present

## 2021-11-25 DIAGNOSIS — M25551 Pain in right hip: Secondary | ICD-10-CM | POA: Diagnosis not present

## 2021-12-01 DIAGNOSIS — D4101 Neoplasm of uncertain behavior of right kidney: Secondary | ICD-10-CM | POA: Diagnosis not present

## 2021-12-02 DIAGNOSIS — N281 Cyst of kidney, acquired: Secondary | ICD-10-CM | POA: Diagnosis not present

## 2021-12-02 DIAGNOSIS — C641 Malignant neoplasm of right kidney, except renal pelvis: Secondary | ICD-10-CM | POA: Diagnosis not present

## 2021-12-02 DIAGNOSIS — D4101 Neoplasm of uncertain behavior of right kidney: Secondary | ICD-10-CM | POA: Diagnosis not present

## 2021-12-02 DIAGNOSIS — N289 Disorder of kidney and ureter, unspecified: Secondary | ICD-10-CM | POA: Diagnosis not present

## 2021-12-07 NOTE — Progress Notes (Signed)
Remote ICD transmission.   

## 2021-12-08 DIAGNOSIS — D4101 Neoplasm of uncertain behavior of right kidney: Secondary | ICD-10-CM | POA: Diagnosis not present

## 2021-12-08 DIAGNOSIS — R300 Dysuria: Secondary | ICD-10-CM | POA: Diagnosis not present

## 2021-12-10 ENCOUNTER — Other Ambulatory Visit: Payer: Self-pay | Admitting: Nurse Practitioner

## 2021-12-17 ENCOUNTER — Telehealth: Payer: Self-pay | Admitting: Family Medicine

## 2021-12-17 NOTE — Telephone Encounter (Signed)
Plz call to ensure she's had f/u imaging by urology (Dr Gloriann Loan) for kidney mass/cyst seen earlier this year.

## 2021-12-19 ENCOUNTER — Telehealth: Payer: Self-pay | Admitting: Family Medicine

## 2021-12-19 NOTE — Telephone Encounter (Signed)
Patient called in stating she talked to kidney doctor. Stated that kidney lesion is still the same size it was 3 months ago, and will do another scan in 6 months and if its the same size she will be on a yearly evaluation. The pain in her vagina area is still there but everything was normal on urine culture (no infection). Thank you!

## 2021-12-19 NOTE — Telephone Encounter (Signed)
Spoke with pt discussing Dr. Synthia Innocent message.  Pt states yes she did and a culture was done.  She was told to give them about 2 wks for results.  Says it's been 2 wks and she hasn't heard anything so she will call them today.

## 2021-12-19 NOTE — Telephone Encounter (Signed)
See prior phone note. Thank you.

## 2022-01-01 ENCOUNTER — Inpatient Hospital Stay (HOSPITAL_COMMUNITY)
Admission: EM | Admit: 2022-01-01 | Discharge: 2022-01-09 | DRG: 291 | Disposition: A | Discharge status: Home / Self Care | Payer: Medicare Other | Attending: Internal Medicine | Admitting: Internal Medicine

## 2022-01-01 ENCOUNTER — Encounter (HOSPITAL_COMMUNITY): Payer: Self-pay | Admitting: *Deleted

## 2022-01-01 ENCOUNTER — Emergency Department (HOSPITAL_COMMUNITY): Payer: Medicare Other

## 2022-01-01 DIAGNOSIS — M810 Age-related osteoporosis without current pathological fracture: Secondary | ICD-10-CM | POA: Diagnosis present

## 2022-01-01 DIAGNOSIS — J440 Chronic obstructive pulmonary disease with acute lower respiratory infection: Secondary | ICD-10-CM | POA: Diagnosis present

## 2022-01-01 DIAGNOSIS — J9621 Acute and chronic respiratory failure with hypoxia: Secondary | ICD-10-CM | POA: Diagnosis present

## 2022-01-01 DIAGNOSIS — Z85828 Personal history of other malignant neoplasm of skin: Secondary | ICD-10-CM

## 2022-01-01 DIAGNOSIS — J9622 Acute and chronic respiratory failure with hypercapnia: Secondary | ICD-10-CM | POA: Diagnosis not present

## 2022-01-01 DIAGNOSIS — I10 Essential (primary) hypertension: Secondary | ICD-10-CM | POA: Diagnosis present

## 2022-01-01 DIAGNOSIS — N289 Disorder of kidney and ureter, unspecified: Secondary | ICD-10-CM

## 2022-01-01 DIAGNOSIS — F411 Generalized anxiety disorder: Secondary | ICD-10-CM | POA: Diagnosis present

## 2022-01-01 DIAGNOSIS — R739 Hyperglycemia, unspecified: Secondary | ICD-10-CM | POA: Diagnosis not present

## 2022-01-01 DIAGNOSIS — I5043 Acute on chronic combined systolic (congestive) and diastolic (congestive) heart failure: Secondary | ICD-10-CM | POA: Diagnosis present

## 2022-01-01 DIAGNOSIS — Z888 Allergy status to other drugs, medicaments and biological substances status: Secondary | ICD-10-CM | POA: Diagnosis not present

## 2022-01-01 DIAGNOSIS — Z8262 Family history of osteoporosis: Secondary | ICD-10-CM

## 2022-01-01 DIAGNOSIS — R140 Abdominal distension (gaseous): Secondary | ICD-10-CM | POA: Diagnosis not present

## 2022-01-01 DIAGNOSIS — Z8616 Personal history of COVID-19: Secondary | ICD-10-CM

## 2022-01-01 DIAGNOSIS — Z882 Allergy status to sulfonamides status: Secondary | ICD-10-CM

## 2022-01-01 DIAGNOSIS — E872 Acidosis, unspecified: Secondary | ICD-10-CM | POA: Diagnosis not present

## 2022-01-01 DIAGNOSIS — R6510 Systemic inflammatory response syndrome (SIRS) of non-infectious origin without acute organ dysfunction: Secondary | ICD-10-CM | POA: Diagnosis present

## 2022-01-01 DIAGNOSIS — K219 Gastro-esophageal reflux disease without esophagitis: Secondary | ICD-10-CM | POA: Diagnosis present

## 2022-01-01 DIAGNOSIS — J9601 Acute respiratory failure with hypoxia: Secondary | ICD-10-CM | POA: Diagnosis not present

## 2022-01-01 DIAGNOSIS — Z9049 Acquired absence of other specified parts of digestive tract: Secondary | ICD-10-CM

## 2022-01-01 DIAGNOSIS — N179 Acute kidney failure, unspecified: Secondary | ICD-10-CM

## 2022-01-01 DIAGNOSIS — R0789 Other chest pain: Secondary | ICD-10-CM | POA: Diagnosis present

## 2022-01-01 DIAGNOSIS — E785 Hyperlipidemia, unspecified: Secondary | ICD-10-CM | POA: Diagnosis present

## 2022-01-01 DIAGNOSIS — R000 Tachycardia, unspecified: Secondary | ICD-10-CM | POA: Diagnosis not present

## 2022-01-01 DIAGNOSIS — Z91012 Allergy to eggs: Secondary | ICD-10-CM | POA: Diagnosis not present

## 2022-01-01 DIAGNOSIS — Z6830 Body mass index (BMI) 30.0-30.9, adult: Secondary | ICD-10-CM

## 2022-01-01 DIAGNOSIS — R0602 Shortness of breath: Secondary | ICD-10-CM | POA: Diagnosis not present

## 2022-01-01 DIAGNOSIS — E875 Hyperkalemia: Secondary | ICD-10-CM | POA: Diagnosis present

## 2022-01-01 DIAGNOSIS — I428 Other cardiomyopathies: Secondary | ICD-10-CM | POA: Diagnosis present

## 2022-01-01 DIAGNOSIS — J810 Acute pulmonary edema: Secondary | ICD-10-CM | POA: Diagnosis not present

## 2022-01-01 DIAGNOSIS — I480 Paroxysmal atrial fibrillation: Secondary | ICD-10-CM | POA: Diagnosis not present

## 2022-01-01 DIAGNOSIS — Z91040 Latex allergy status: Secondary | ICD-10-CM

## 2022-01-01 DIAGNOSIS — R0689 Other abnormalities of breathing: Secondary | ICD-10-CM | POA: Diagnosis not present

## 2022-01-01 DIAGNOSIS — Z7982 Long term (current) use of aspirin: Secondary | ICD-10-CM

## 2022-01-01 DIAGNOSIS — Z95810 Presence of automatic (implantable) cardiac defibrillator: Secondary | ICD-10-CM | POA: Diagnosis present

## 2022-01-01 DIAGNOSIS — Z91018 Allergy to other foods: Secondary | ICD-10-CM

## 2022-01-01 DIAGNOSIS — J9602 Acute respiratory failure with hypercapnia: Secondary | ICD-10-CM | POA: Diagnosis present

## 2022-01-01 DIAGNOSIS — Z885 Allergy status to narcotic agent status: Secondary | ICD-10-CM

## 2022-01-01 DIAGNOSIS — J189 Pneumonia, unspecified organism: Secondary | ICD-10-CM

## 2022-01-01 DIAGNOSIS — G4730 Sleep apnea, unspecified: Secondary | ICD-10-CM | POA: Diagnosis present

## 2022-01-01 DIAGNOSIS — K5900 Constipation, unspecified: Secondary | ICD-10-CM | POA: Diagnosis present

## 2022-01-01 DIAGNOSIS — R61 Generalized hyperhidrosis: Secondary | ICD-10-CM | POA: Diagnosis not present

## 2022-01-01 DIAGNOSIS — M797 Fibromyalgia: Secondary | ICD-10-CM | POA: Diagnosis present

## 2022-01-01 DIAGNOSIS — E874 Mixed disorder of acid-base balance: Secondary | ICD-10-CM | POA: Diagnosis present

## 2022-01-01 DIAGNOSIS — Z880 Allergy status to penicillin: Secondary | ICD-10-CM

## 2022-01-01 DIAGNOSIS — I083 Combined rheumatic disorders of mitral, aortic and tricuspid valves: Secondary | ICD-10-CM | POA: Diagnosis present

## 2022-01-01 DIAGNOSIS — Z89422 Acquired absence of other left toe(s): Secondary | ICD-10-CM | POA: Diagnosis not present

## 2022-01-01 DIAGNOSIS — K5909 Other constipation: Secondary | ICD-10-CM | POA: Diagnosis not present

## 2022-01-01 DIAGNOSIS — J4530 Mild persistent asthma, uncomplicated: Secondary | ICD-10-CM

## 2022-01-01 DIAGNOSIS — Z825 Family history of asthma and other chronic lower respiratory diseases: Secondary | ICD-10-CM

## 2022-01-01 DIAGNOSIS — E669 Obesity, unspecified: Secondary | ICD-10-CM | POA: Diagnosis not present

## 2022-01-01 DIAGNOSIS — Z9981 Dependence on supplemental oxygen: Secondary | ICD-10-CM

## 2022-01-01 DIAGNOSIS — Z79899 Other long term (current) drug therapy: Secondary | ICD-10-CM

## 2022-01-01 DIAGNOSIS — I110 Hypertensive heart disease with heart failure: Principal | ICD-10-CM | POA: Diagnosis present

## 2022-01-01 DIAGNOSIS — R4182 Altered mental status, unspecified: Secondary | ICD-10-CM | POA: Diagnosis not present

## 2022-01-01 DIAGNOSIS — I509 Heart failure, unspecified: Secondary | ICD-10-CM | POA: Diagnosis not present

## 2022-01-01 DIAGNOSIS — J45909 Unspecified asthma, uncomplicated: Secondary | ICD-10-CM | POA: Diagnosis present

## 2022-01-01 DIAGNOSIS — I447 Left bundle-branch block, unspecified: Secondary | ICD-10-CM | POA: Diagnosis present

## 2022-01-01 DIAGNOSIS — Z8249 Family history of ischemic heart disease and other diseases of the circulatory system: Secondary | ICD-10-CM

## 2022-01-01 DIAGNOSIS — Z9102 Food additives allergy status: Secondary | ICD-10-CM

## 2022-01-01 LAB — BASIC METABOLIC PANEL
Anion gap: 18 — ABNORMAL HIGH (ref 5–15)
BUN: 22 mg/dL (ref 8–23)
CO2: 11 mmol/L — ABNORMAL LOW (ref 22–32)
Calcium: 10 mg/dL (ref 8.9–10.3)
Chloride: 107 mmol/L (ref 98–111)
Creatinine, Ser: 1.21 mg/dL — ABNORMAL HIGH (ref 0.44–1.00)
GFR, Estimated: 46 mL/min — ABNORMAL LOW (ref >60)
Glucose, Bld: 249 mg/dL — ABNORMAL HIGH (ref 70–99)
Potassium: 5.5 mmol/L — ABNORMAL HIGH (ref 3.5–5.1)
Sodium: 136 mmol/L (ref 135–145)

## 2022-01-01 LAB — I-STAT ARTERIAL BLOOD GAS, ED
Acid-base deficit: 11 mmol/L — ABNORMAL HIGH (ref 0.0–2.0)
Bicarbonate: 18.1 mmol/L — ABNORMAL LOW (ref 20.0–28.0)
Calcium, Ion: 1.27 mmol/L (ref 1.15–1.40)
HCT: 41 % (ref 36.0–46.0)
Hemoglobin: 13.9 g/dL (ref 12.0–15.0)
O2 Saturation: 94 %
Patient temperature: 98.1
Potassium: 4.5 mmol/L (ref 3.5–5.1)
Sodium: 136 mmol/L (ref 135–145)
TCO2: 20 mmol/L — ABNORMAL LOW (ref 22–32)
pCO2 arterial: 50.6 mmHg — ABNORMAL HIGH (ref 32–48)
pH, Arterial: 7.16 — CL (ref 7.35–7.45)
pO2, Arterial: 88 mmHg (ref 83–108)

## 2022-01-01 LAB — CBC WITH DIFFERENTIAL/PLATELET
Abs Immature Granulocytes: 0.11 10*3/uL — ABNORMAL HIGH (ref 0.00–0.07)
Basophils Absolute: 0.1 10*3/uL (ref 0.0–0.1)
Basophils Relative: 0 %
Eosinophils Absolute: 0.2 10*3/uL (ref 0.0–0.5)
Eosinophils Relative: 1 %
HCT: 46.2 % — ABNORMAL HIGH (ref 36.0–46.0)
Hemoglobin: 14.4 g/dL (ref 12.0–15.0)
Immature Granulocytes: 1 %
Lymphocytes Relative: 23 %
Lymphs Abs: 4.5 10*3/uL — ABNORMAL HIGH (ref 0.7–4.0)
MCH: 30.3 pg (ref 26.0–34.0)
MCHC: 31.2 g/dL (ref 30.0–36.0)
MCV: 97.3 fL (ref 80.0–100.0)
Monocytes Absolute: 1.5 10*3/uL — ABNORMAL HIGH (ref 0.1–1.0)
Monocytes Relative: 8 %
Neutro Abs: 12.9 10*3/uL — ABNORMAL HIGH (ref 1.7–7.7)
Neutrophils Relative %: 67 %
Platelets: 273 10*3/uL (ref 150–400)
RBC: 4.75 MIL/uL (ref 3.87–5.11)
RDW: 13.2 % (ref 11.5–15.5)
WBC: 19.3 10*3/uL — ABNORMAL HIGH (ref 4.0–10.5)
nRBC: 0 % (ref 0.0–0.2)

## 2022-01-01 LAB — PROCALCITONIN: Procalcitonin: 0.47 ng/mL

## 2022-01-01 LAB — TROPONIN I (HIGH SENSITIVITY)
Troponin I (High Sensitivity): 16 ng/L (ref <18)
Troponin I (High Sensitivity): 63 ng/L — ABNORMAL HIGH (ref <18)

## 2022-01-01 LAB — BRAIN NATRIURETIC PEPTIDE: B Natriuretic Peptide: 2040.8 pg/mL — ABNORMAL HIGH (ref 0.0–100.0)

## 2022-01-01 MED ORDER — MAGNESIUM OXIDE -MG SUPPLEMENT 400 (240 MG) MG PO TABS
400.0000 mg | ORAL_TABLET | Freq: Every day | ORAL | Status: DC
  Administered 2022-01-02 – 2022-01-09 (×8): 400 mg via ORAL
  Filled 2022-01-01 (×8): qty 1

## 2022-01-01 MED ORDER — ACETAMINOPHEN 325 MG PO TABS
650.0000 mg | ORAL_TABLET | Freq: Four times a day (QID) | ORAL | Status: DC | PRN
  Administered 2022-01-02 – 2022-01-03 (×3): 325 mg via ORAL
  Administered 2022-01-03: 650 mg via ORAL
  Administered 2022-01-03 – 2022-01-06 (×3): 325 mg via ORAL
  Filled 2022-01-01 (×6): qty 2

## 2022-01-01 MED ORDER — SODIUM CHLORIDE 0.9% FLUSH
3.0000 mL | Freq: Two times a day (BID) | INTRAVENOUS | Status: DC
  Administered 2022-01-01 – 2022-01-09 (×16): 3 mL via INTRAVENOUS

## 2022-01-01 MED ORDER — SACUBITRIL-VALSARTAN 49-51 MG PO TABS: 1.0000 | ORAL_TABLET | Freq: Every day | ORAL | Status: DC

## 2022-01-01 MED ORDER — BUDESONIDE 0.5 MG/2ML IN SUSP
0.5000 mg | Freq: Two times a day (BID) | RESPIRATORY_TRACT | Status: DC
  Filled 2022-01-01 (×7): qty 2

## 2022-01-01 MED ORDER — BECLOMETHASONE DIPROP HFA 80 MCG/ACT IN AERB
2.0000 | INHALATION_SPRAY | Freq: Two times a day (BID) | RESPIRATORY_TRACT | Status: DC
Start: 2022-01-01 — End: 2022-01-01

## 2022-01-01 MED ORDER — BUMETANIDE 0.25 MG/ML IJ SOLN
0.5000 mg | Freq: Once | INTRAMUSCULAR | Status: AC
Start: 2022-01-01 — End: 2022-01-01
  Administered 2022-01-01: 0.5 mg via INTRAVENOUS
  Filled 2022-01-01: qty 2

## 2022-01-01 MED ORDER — MICONAZOLE NITRATE 2 % EX CREA: 1.0000 | TOPICAL_CREAM | CUTANEOUS | Status: DC | PRN

## 2022-01-01 MED ORDER — FUROSEMIDE 10 MG/ML IJ SOLN
20.0000 mg | INTRAMUSCULAR | Status: DC
  Filled 2022-01-01: qty 2

## 2022-01-01 MED ORDER — BUMETANIDE 0.25 MG/ML IJ SOLN
0.5000 mg | Freq: Two times a day (BID) | INTRAMUSCULAR | Status: DC
  Administered 2022-01-01: 0.5 mg via INTRAVENOUS
  Filled 2022-01-01 (×2): qty 2

## 2022-01-01 MED ORDER — ASPIRIN 81 MG PO TBEC
81.0000 mg | DELAYED_RELEASE_TABLET | Freq: Every day | ORAL | Status: DC
  Administered 2022-01-01 – 2022-01-08 (×8): 81 mg via ORAL
  Filled 2022-01-01 (×8): qty 1

## 2022-01-01 MED ORDER — ALBUTEROL SULFATE (2.5 MG/3ML) 0.083% IN NEBU: 2.5000 mg | INHALATION_SOLUTION | RESPIRATORY_TRACT | Status: DC | PRN

## 2022-01-01 MED ORDER — FUROSEMIDE 10 MG/ML IJ SOLN: 10.0000 mg | Freq: Two times a day (BID) | INTRAMUSCULAR | Status: DC

## 2022-01-01 MED ORDER — HYDROCORTISONE ACETATE 25 MG RE SUPP: 25.0000 mg | Freq: Two times a day (BID) | RECTAL | Status: DC | PRN

## 2022-01-01 MED ORDER — MUSCLE RUB 10-15 % EX CREA: 1.0000 | TOPICAL_CREAM | CUTANEOUS | Status: DC | PRN

## 2022-01-01 MED ORDER — TORSEMIDE 20 MG PO TABS
20.0000 mg | ORAL_TABLET | Freq: Once | ORAL | Status: DC
  Filled 2022-01-01: qty 1

## 2022-01-01 MED ORDER — LEVALBUTEROL HCL 0.63 MG/3ML IN NEBU
0.6300 mg | INHALATION_SOLUTION | Freq: Four times a day (QID) | RESPIRATORY_TRACT | Status: DC | PRN
Start: 2022-01-01 — End: 2022-01-09

## 2022-01-01 MED ORDER — CARVEDILOL 3.125 MG PO TABS
3.1250 mg | ORAL_TABLET | Freq: Two times a day (BID) | ORAL | Status: DC
  Administered 2022-01-02 – 2022-01-09 (×15): 3.125 mg via ORAL
  Filled 2022-01-01 (×15): qty 1

## 2022-01-01 MED ORDER — NYSTATIN 100000 UNIT/GM EX CREA
1.0000 | TOPICAL_CREAM | CUTANEOUS | Status: DC | PRN
Start: 2022-01-01 — End: 2022-01-09

## 2022-01-01 MED ORDER — LORAZEPAM 2 MG/ML IJ SOLN: 0.2500 mg | Freq: Two times a day (BID) | INTRAMUSCULAR | Status: DC | PRN

## 2022-01-01 MED ORDER — SODIUM CHLORIDE 0.9 % IV SOLN
2.0000 g | INTRAVENOUS | Status: DC
  Administered 2022-01-01 – 2022-01-06 (×6): 2 g via INTRAVENOUS
  Filled 2022-01-01 (×6): qty 20

## 2022-01-01 MED ORDER — ACETAMINOPHEN 650 MG RE SUPP: 650.0000 mg | Freq: Four times a day (QID) | RECTAL | Status: DC | PRN

## 2022-01-01 MED ORDER — ENOXAPARIN SODIUM 40 MG/0.4ML IJ SOSY
40.0000 mg | PREFILLED_SYRINGE | INTRAMUSCULAR | Status: DC
  Administered 2022-01-01 – 2022-01-09 (×9): 40 mg via SUBCUTANEOUS
  Filled 2022-01-01 (×9): qty 0.4

## 2022-01-01 NOTE — ED Notes (Signed)
No meds given the pt is breathing better by calming down

## 2022-01-01 NOTE — Consult Note (Addendum)
Cardiology Consultation:   Patient ID: Ashley [name] MRN: [id number]; DOB: August 02, 1945  Admit date: 01/01/2022 Date of Consult: 01/01/2022  PCP:  [name], MD   [facility] Providers Cardiologist:  [name], MD  Electrophysiologist:  Cristopher Peru, MD       Patient Profile:   Ashley [name] is a 76 y.o. female with a hx of NICM, fibromyalgia, irritable bowel, PNA, chronic cough secondary to GERD, abnormal PFTs in past, LBBB intermittent, ICD  who is being seen 01/01/2022 for the evaluation of acute CHF at the request of Dr. Tamala Julian.  History of Present Illness:   [name] has hx of NICM with dx in 2013  ICD placed same year.  cMRI was done in 2016 with dx NICM. In 10/2018 with EF 15% on echo and severe MR.  Heart cath at that time with normal cors, EF 20-25%, mildly elevated filling pressures.  With medication EF up to 30-35% in 02/2019 and in 2021 back to 20-25%.  In 2022 EF 30%.   Echo in May this year with EF 25-30%.  Global hypokinesis.  LV diastolic parameters are indeterminate. RV is normal.  LA mildly dilated, Mod MR, mo MS,  trivial AR and no AS.   She has had ICD upgrade to BiV medtronic device 11/04/18. Marland Kitchen  She took what she thought were too many torsemide week before last so none last week.   Pt presented to ER today early AM with acute SOB by EMS.  She was SOB for a day but worse during the night (she had been wearing her home 02 yesterday for SOB) - EMS attempted CPAP but she did not tolerate.  Her on NRB pt in distress and placed on BIPAP.  She is intolerant of lasix it caused significant hypotension with loss of vision due to the hypotension.   She was given Bumex 0.5 mg IV and she agreed to take.   She has leukocytosis and tachycardia and given IV rocephin.   She did go out 3 times in ambulance per pt.  May be due to hypoxemia   no chest pain but abd pain.   Last device check 11/21/21 with normal device function, 1 SVT, 25 sec in duration with HR 140.     BNP:  2,040 hs tropon 16, 63.  Procalcitonin 0.47  Na 136 K+ 5.5 glucose 249 Cr 1.21   WBC 19.3 Hgb 14.4 plts 273.  Blood cultures pending.   PCXR IMPRESSION: Interval progression of diffuse interstitial opacity suggesting pulmonary edema.  Fullness in the right hilum and infrahilar region is mildly progressive in the interval, potentially related to vascular congestion and superimposed edema. Follow-up dedicated PA and lateral chest x-ray recommended to further evaluate when the patient is able.   EKG:  The EKG was personally reviewed and demonstrates:  ST with LBBB Telemetry:  Telemetry was personally reviewed and demonstrates:  sinus tach  BP 94/58  P 84 P 81 T 98.3    Past Medical History:  Diagnosis Date   Abdominal aortic atherosclerosis ([place]) 11/2015   by CT   Allergy    Anemia    Anxiety    Automatic implantable cardioverter-defibrillator in situ    a. 2012 s/p MDT single lead ICD; b. 10/2018 BiV upgrade-->MDT ser # [id number].   Bronchiectasis    a. possibly mild, treat URIs aggressively   Cancer ([place])    basal cell, arms & neck   Chronic sinusitis    COPD (chronic obstructive pulmonary  disease) ([place])    COVID-19 12/06/2020   Depression    Dyspnea    Essential hypertension    Pt denies   Family history of adverse reaction to anesthesia    Mother - PONV   Fibrocystic breast disease    Fibromyalgia    GERD (gastroesophageal reflux disease)    H/O multiple allergies 10/10/2013   Hemorrhoids    HFrEF (heart failure with reduced ejection fraction) ([place])    a. 06/2011 cMRI: EF 34%, no LGE; b. 01/2016 Echo: EF 35-40%; c. 07/2016 Echo: EF 40-45%; d. 02/2020 Echo: EF 20-25%, glob HK. GrI DD. Mildly red RV fxn. Mildly dil LA. Triv MR.   History of diverticulitis of colon    History of pneumonia 2012   Insomnia    Irritable bowel syndrome    Laryngeal nodule    Dr. Thomasena Edis   Migraine without aura, without mention of intractable migraine without mention of status  migrainosus    Mitral regurgitation    Monoclonal gammopathy    Neuropathy    feet and hands   Nonischemic cardiomyopathy ([place])    a. 2012 s/p single lead ICD; b. 06/2011 cMRI: EF 34%, no LGE; b. 01/2016 Echo: EF 35-40%; c. 07/2016 Echo: EF 40-45%; d. 10/2018 Cath: nl cors. EF 20-25%; e. 10/2018 s/p BiV MDT ICD upgrade; f. 02/2020 Echo: EF 20-25%, glob HK. GrI DD.   Osteoarthritis    knees, back, hands, low back,    Osteoporosis 01/2013   T-2.7 spine (01/2016)   PONV (postoperative nausea and vomiting)    sigmoid diverticulitis with perforation, abscess and fistula 09/30/2013   Sleep apnea 2010   borderline, inconclusive- /w [place]     Past Surgical History:  Procedure Laterality Date   ABDOMINAL HYSTERECTOMY  1987   w/BSO   ANAL RECTAL MANOMETRY N/A 02/02/2016   Procedure: ANO RECTAL MANOMETRY;  Surgeon: Mauri Pole, MD;  Location: [facility];  Service: Endoscopy;  Laterality: N/A;   APPENDECTOMY     AUGMENTATION MAMMAPLASTY     BIV UPGRADE N/A 11/04/2018   Procedure: BIV ICD  UPGRADE;  Surgeon: Evans Lance, MD;  Location: [place] [facility];  Service: Cardiovascular;  Laterality: N/A;   breast cystectomies     due to FCBD   BREAST MASS EXCISION  01/2010   Left-fibrocystic change w/intraductal papilloma, no malignancy   carotid US  57/0177   9-39% LICA, normal RICA   CATARACT EXTRACTION W/PHACO Right 03/08/2021   Procedure: CATARACT EXTRACTION PHACO AND INTRAOCULAR LENS PLACEMENT ([place]) RIGHT;  Surgeon: Birder Robson, MD;  Location: [place];  Service: Ophthalmology;  Laterality: Right;  13.01 1:15.7   [place] Bunionectomy  11/04/08   Right Great Toe (Dr. Beola Cord)   COLONOSCOPY  01/2018   1 small benign polyp, diverticulosis, healthy anastomosis [name] @ [place])   COLOSTOMY N/A 10/02/2013   DESCENDING COLOSTOMY;  Surgeon: Imogene Burn. [place], MD   COLOSTOMY REVERSAL N/A 02/10/2014   [name], MD   EMG/MCV  10/16/01   + mild carpal tunnel    ESOPHAGOGASTRODUODENOSCOPY  09/2014   small HH, irregular Z line, fundic gland polyps with mild gastritis Olevia Perches)   Flex laryngoscopy  06/11/06   ([place]) nml   IMPLANTABLE CARDIOVERTER DEFIBRILLATOR IMPLANT N/A 07/14/2011   MDT single chamber ICD   KNEE ARTHROSCOPY W/ ORIF     Left; "meniscus tear"   LASIK     left eye   MANDIBLE SURGERY     "got about 6 pins  in the bottom of my jaw"   NCS  11/2014   L ulnar neuropathy, mild B median nerve entrapment ([place])   OSTEOTOMY     Left foot   PARTIAL COLECTOMY N/A 10/02/2013   PARTIAL COLECTOMY;  Surgeon: Imogene Burn. [place], MD   RIGHT/LEFT HEART CATH AND CORONARY ANGIOGRAPHY N/A 10/16/2018   Procedure: RIGHT/LEFT HEART CATH AND CORONARY ANGIOGRAPHY;  Surgeon: Wellington Hampshire, MD;  Location: [place] [facility];  Service: Cardiovascular;  Laterality: N/A;   sinus surgery     x3 with balloon    TOE AMPUTATION     "toe beside baby toe on left foot; got gangrene from corn"   [place] Medications:  Prior to Admission medications   Medication Sig Start Date End Date Taking? Authorizing Provider  acetaminophen (TYLENOL) 500 MG tablet Take 500 mg by mouth daily as needed for moderate pain or headache (PAIN).    Yes [provider]  Ascorbic Acid (VITAMIN C) 1000 MG tablet Take 1,000 mg by mouth See admin instructions. Take 1000 mg daily, may take an additional 1000 mg as needed for cold symptoms   Yes [provider]  aspirin EC 81 MG tablet Take 81 mg by mouth at bedtime.    Yes [provider]  beclomethasone (QVAR) 80 MCG/ACT inhaler Inhale 2 puffs into the lungs 2 (two) times daily. 03/04/21  Yes Parrett, Tammy S, NP  Biotin 10 MG CAPS Take 1 tablet by mouth daily.   Yes [provider]  carvedilol (COREG) 6.25 MG tablet TAKE 0.5 TABLETS (3.125 MG TOTAL) BY MOUTH 2 (TWO) TIMES DAILY WITH A MEAL. 10/17/21  Yes Gollan, Kathlene November, MD  Cholecalciferol (VITAMIN D3) 50 MCG (2000 UT)  capsule Take 1 capsule (2,000 Units total) by mouth daily. 04/15/21  Yes [name], MD  Coenzyme Q10 (COQ-10) 100 MG CAPS Take 100 mg by mouth daily.   Yes [provider]  COLLAGEN PO Take by mouth daily.   Yes [provider]  ezetimibe (ZETIA) 10 MG tablet TAKE 1 TABLET BY MOUTH EVERY DAY Patient taking differently: Take 10 mg by mouth daily. 05/03/21  Yes Minna Merritts, MD  guaiFENesin (MUCINEX) 600 MG 12 hr tablet Take 600 mg by mouth 2 (two) times daily.   Yes [provider]  levalbuterol (XOPENEX HFA) 45 MCG/ACT inhaler Inhale 2 puffs into the lungs every 6 (six) hours as needed for wheezing or shortness of breath. 09/15/21  Yes Furth, Cadence H, PA-C  magnesium 30 MG tablet Take 30 mg by mouth daily.   Yes [provider]  Menthol, Topical Analgesic, (BIOFREEZE ROLL-ON EX) Apply 1 Application topically as needed (For shoulder pain).   Yes [provider]  miconazole (MICOTIN) 2 % powder Apply 1 application topically as needed (yeast).   Yes [provider]  nystatin cream (MYCOSTATIN) Apply 1 application. topically as needed for dry skin (yeast infection). 10/14/21  Yes [name], MD  OXYGEN Inhale 2 L into the lungs at bedtime as needed (Shortness of breath).   Yes [provider]  potassium chloride (KLOR-CON) 10 MEQ tablet Take 1 tablet (10 mEq total) by mouth 3 (three) times a week. 01/05/21  Yes Theora Gianotti, NP  pyridOXINE (VITAMIN B-6) 100 MG tablet Take 100 mg by mouth daily.   Yes [provider]  Red Yeast Rice Extract (RED YEAST RICE PO) Take 1 capsule by mouth in the morning  and at bedtime.   Yes [provider]  sacubitril-valsartan (ENTRESTO) 49-51 MG Take 1 tablet by mouth daily. 05/26/21  Yes Baldwin Jamaica, PA-C  spironolactone (ALDACTONE) 25 MG tablet Take 1 tablet (25 mg total) by mouth daily. 01/05/21  Yes Theora Gianotti, NP  torsemide (DEMADEX) 10 MG  tablet Take 1 tablet by mouth twice daily every other day. Patient taking differently: Take 10 mg by mouth daily as needed (Fluid or swelling). 12/12/21  Yes Minna Merritts, MD  vitamin E 400 UNIT capsule Take 400 Units by mouth daily.   Yes [provider]  benzonatate (TESSALON) 200 MG capsule Take 1 capsule (200 mg total) by mouth 3 (three) times daily as needed for cough. Patient not taking: Reported on 01/01/2022 12/31/20   [name], MD  bisoprolol (ZEBETA) 5 MG tablet Take 5 mg by mouth daily. 06/06/11 07/02/18  Minna Merritts, MD    Inpatient Medications: Scheduled Meds:  aspirin EC  81 mg Oral QHS   beclomethasone  2 puff Inhalation BID   bumetanide (BUMEX) IV  0.5 mg Intravenous Q12H   carvedilol  3.125 mg Oral BID WC   enoxaparin (LOVENOX) injection  40 mg Subcutaneous Q24H   [START ON 01/02/2022] magnesium oxide  400 mg Oral Daily   sacubitril-valsartan  1 tablet Oral Daily   sodium chloride flush  3 mL Intravenous Q12H   Continuous Infusions:  cefTRIAXone (ROCEPHIN)  IV Stopped (01/01/22 1127)   PRN Meds: acetaminophen **OR** acetaminophen, hydrocortisone, levalbuterol, LORazepam, Menthol (Topical Analgesic), miconazole, nystatin cream  Allergies:    Allergies  Allergen Reactions   Chlorhexidine Other (See Comments)    blisters   Codeine Other (See Comments)    Confusion and dizziness   Cyclobenzaprine Other (See Comments)    Adverse reaction - back and throat pain, dizziness, exhaustion   Cymbalta [Duloxetine Hcl] Other (See Comments)    "body spasms and made me feel weird in my head"    Diflucan [Fluconazole] Nausea And Vomiting   Dilaudid [Hydromorphone Hcl] Other (See Comments)    Feels like she is burning internally    Influenza Vac Split [Influenza Virus Vaccine] Other (See Comments)    "allergic to concentrated eggs that are in vaccine"   Lasix [Furosemide] Other (See Comments)    Severe hypotension on lasix 40 PO (can handle 20 mg oral  dosing) 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    Latex Nausea Only, Rash and Other (See Comments)    Pain and infection   Lipitor [Atorvastatin] Other (See Comments)    Severe muscle cramps to generic atorvastatin.  Able to take brand lipitor.   Morphine And Related Other (See Comments)    Internally feels like she is on fire    Pravastatin Other (See Comments)    myalgias   Prevacid [Lansoprazole] Other (See Comments)    Worsened GI side effects   Prevnar [Pneumococcal 13-Val Conj Vacc] Other (See Comments)    Egg allergy Bad reaction after shot   Rosuvastatin Other (See Comments)    Was not effective controlling lipids   Simvastatin Other (See Comments)    Leg cramps   Tape Other (See Comments) and Rash    All adhesives  - Blisters, itching and burning  blistering   Farxiga [Dapagliflozin] Other (See Comments)    Unknown reaction    Metaxalone Other (See Comments)    Unknown reaction    Tramadol     Leg cramps   Antihistamines, Chlorpheniramine-Type Other (See  Comments)    Alters vision   Betadine [Povidone Iodine] Rash    Per pt.    Onion Diarrhea and Other (See Comments)    GI distress, flared IBS   Other Hives and Rash    NOTE: pt is able to take cephalosporins without reaction   Penicillins Other (See Comments)    CHILDHOOD ALLEGY/REACTION: "told 50 years ago I couldn't take it; might be immune to it", able to take amoxicillin Has patient had a PCN reaction causing immediate rash, facial/tongue/throat swelling, SOB or lightheadedness with hypotension: Unknown Has patient had a PCN reaction causing severe rash involving mucus membranes or skin necrosis: UnknowN Has patient had a PCN reaction that required hospitalization: /Unknown Has patient had a PCN reaction occurring within the last 10 years: Unknown If a   Red [place]ye Nausea Only and Swelling   Sertraline Other (See Comments)    Overly sedating   Sulfasalazine Other (See Comments)    "makes my whole body smell like sulfa & makes  me sick just smelling it"    Social History:   Social History   Socioeconomic History   Marital status: Married    Spouse name: Not on file   Number of children: 1   Years of education: Not on file   Highest education level: Not on file  Occupational History   Occupation: Oncologist   Occupation: Aeronautical engineer  Tobacco Use   Smoking status: Never   Smokeless tobacco: Never   Tobacco comments:    Lived with smokers x 23 yrs, smoked herself "for a week"  Vaping Use   Vaping Use: Never used  Substance and Sexual Activity   Alcohol use: No    Alcohol/week: 0.0 standard drinks of alcohol   Drug use: No   Sexual activity: Not Currently  Other Topics Concern   Not on file  Social History Narrative   Lives with husband Herbie [place]   1 adopted child   Social Determinants of Health   Financial Resource Strain: Medium Risk (05/24/2021)   Overall Financial Resource Strain (CARDIA)    Difficulty of Paying Living Expenses: Somewhat hard  Food Insecurity: No Food Insecurity (12/01/2019)   Hunger Vital Sign    Worried About Running Out of Food in the Last Year: Never true    Ran Out of Food in the Last Year: Never true  Transportation Needs: No Transportation Needs (12/01/2019)   PRAPARE - Hydrologist (Medical): No    Lack of Transportation (Non-Medical): No  Physical Activity: Insufficiently Active (12/01/2019)   Exercise Vital Sign    Days of Exercise per Week: 3 days    Minutes of Exercise per Session: 30 min  Stress: Stress Concern Present (12/01/2019)   [place]    Feeling of Stress : Rather much  Social Connections: Not on file  Intimate Partner Violence: Not At Risk (12/01/2019)   Humiliation, Afraid, Rape, and Kick questionnaire    Fear of Current or Ex-Partner: No    Emotionally Abused: No    Physically Abused: No    Sexually Abused: No     Family History:    Family History  Problem Relation Age of Onset   Lung cancer Father        + smoker   Colon polyps Father    Dementia Mother    Osteoporosis Mother        Lumbar spine  Stroke Mother        x 5 @ 33 YOA   Arthritis Mother        hands   Emphysema Mother    Alcohol abuse Paternal Grandfather    Stroke Paternal Grandfather        ?   Heart disease Paternal Grandfather    Hypertension Maternal Grandfather        ?   Heart disease Paternal Grandmother    Colon cancer Neg Hx    Esophageal cancer Neg Hx    Gallbladder disease Neg Hx      ROS:  Please see the history of present illness.  General:no colds or fevers, no weight changes Skin:no rashes or ulcers HEENT:no blurred vision, no congestion CV:see HPI PUL:see HPI GI:no diarrhea constipation or melena, no indigestion GU:no hematuria, no dysuria MS:no joint pain, no claudication Neuro: X 3 syncope in ambulance, no lightheadedness Endo:no diabetes, no thyroid disease  All other ROS reviewed and negative.     Physical Exam/Data:   Vitals:   01/01/22 1015 01/01/22 1041 01/01/22 1200 01/01/22 1300  BP: 113/78  101/67 100/62  Pulse: 86  86 80  Resp: '17  20 20  '$ Temp:  98.3 F (36.8 C)    TempSrc:  Temporal    SpO2: 100%  100% 99%  Weight:      Height:        Intake/Output Summary (Last 24 hours) at 01/01/2022 1417 Last data filed at 01/01/2022 1200 Gross per 24 hour  Intake 100.55 ml  Output --  Net 100.55 ml      01/01/2022    5:40 AM 10/14/2021   10:50 AM 10/03/2021   10:08 AM  Last 3 Weights  Weight (lbs) 185 lb 6.5 oz 185 lb 6 oz 182 lb 4 oz  Weight (kg) 84.1 kg 84.086 kg 82.668 kg     Body mass index is 30.85 kg/m.  General:  Well nourished, well developed, in no acute distress but with bipap HEENT: normal Neck: no JVD siting up in bed Vascular: No carotid bruits; Distal pulses 2+ bilaterally Cardiac:  normal S1, S2; RRR; no murmur  gallup rub or click  Lungs:  diminished to  auscultation bilaterally, upper airway wheezing though with bipap difficult to tell. no rhonchi or rales  Abd: soft, nontender, no hepatomegaly  Ext: no edema Musculoskeletal:  No deformities, BUE and BLE strength normal and equal Skin: warm and dry  Neuro:  CNs 2-12 intact, no focal abnormalities noted Psych:  Normal affect    Relevant CV Studies: Echo 09/16/21 IMPRESSIONS     1. Left ventricular ejection fraction, by estimation, is 25 to 30%. The  left ventricle has severely decreased function. The left ventricle  demonstrates global hypokinesis. The left ventricular internal cavity size  was moderately dilated. Left  ventricular diastolic parameters are indeterminate.   2. Right ventricular systolic function is normal. The right ventricular  size is normal. Tricuspid regurgitation signal is inadequate for assessing  PA pressure.   3. Left atrial size was mildly dilated.   4. The mitral valve is normal in structure. Moderate mitral valve  regurgitation. No evidence of mitral stenosis. Moderate mitral annular  calcification.   5. The aortic valve was not well visualized. Aortic valve regurgitation  is trivial. No aortic stenosis is present.   6. The inferior vena cava is normal in size with greater than 50%  respiratory variability, suggesting right atrial pressure of 3 mmHg.   Comparison(s): LVEF  30$.   FINDINGS   Left Ventricle: Left ventricular ejection fraction, by estimation, is 25  to 30%. The left ventricle has severely decreased function. The left  ventricle demonstrates global hypokinesis. Definity contrast agent was  given IV to delineate the left  ventricular endocardial borders. The left ventricular internal cavity size  was moderately dilated. There is no left ventricular hypertrophy. Left  ventricular diastolic parameters are indeterminate.   Right Ventricle: The right ventricular size is normal. No increase in  right ventricular wall thickness. Right  ventricular systolic function is  normal. Tricuspid regurgitation signal is inadequate for assessing PA  pressure.   Left Atrium: Left atrial size was mildly dilated.   Right Atrium: Right atrial size was normal in size.   Pericardium: There is no evidence of pericardial effusion.   Mitral Valve: The mitral valve is normal in structure. Moderate mitral  annular calcification. Moderate mitral valve regurgitation. No evidence of  mitral valve stenosis.   Tricuspid Valve: The tricuspid valve is normal in structure. Tricuspid  valve regurgitation is mild . No evidence of tricuspid stenosis.   Aortic Valve: The aortic valve was not well visualized. Aortic valve  regurgitation is trivial. No aortic stenosis is present. Aortic valve mean  gradient measures 5.0 mmHg. Aortic valve peak gradient measures 7.7 mmHg.  Aortic valve area, by VTI measures  2.65 cm.   Pulmonic Valve: The pulmonic valve was normal in structure. Pulmonic valve  regurgitation is not visualized. No evidence of pulmonic stenosis.   Aorta: The aortic root is normal in size and structure.   Venous: The inferior vena cava is normal in size with greater than 50%  respiratory variability, suggesting right atrial pressure of 3 mmHg.   IAS/Shunts: No atrial level shunt detected by color flow Doppler.    Cardiac cath 10/16/18  There is severe left ventricular systolic dysfunction. LV end diastolic pressure is moderately elevated.   1.  Normal coronary arteries.  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 2.  Severely reduced LV systolic function with an EF of 20 to 25%. 3.  Right heart catheterization showed moderately elevated filling pressures, mild pulmonary hypertension and mildly reduced cardiac output.   Recommendations: Continue IV diuresis.  Recommend medical therapy for nonischemic cardiomyopathy.   Laboratory Data:  High Sensitivity Troponin:   Recent Labs  Lab 01/01/22 0453  01/01/22 1037  TROPONINIHS 16 63*     Chemistry Recent Labs  Lab 01/01/22 0453 01/01/22 0502  NA 136 136  K 5.5* 4.5  CL 107  --   CO2 11*  --   GLUCOSE 249*  --   BUN 22  --   CREATININE 1.21*  --   CALCIUM 10.0  --   GFRNONAA 46*  --   ANIONGAP 18*  --     No results for input(s): "PROT", "ALBUMIN", "AST", "ALT", "ALKPHOS", "BILITOT" in the last 168 hours. Lipids No results for input(s): "CHOL", "TRIG", "HDL", "LABVLDL", "LDLCALC", "CHOLHDL" in the last 168 hours.  Hematology Recent Labs  Lab 01/01/22 0453 01/01/22 0502  WBC 19.3*  --   RBC 4.75  --   HGB 14.4 13.9  HCT 46.2* 41.0  MCV 97.3  --   MCH 30.3  --   MCHC 31.2  --   RDW 13.2  --   PLT 273  --    Thyroid No results for input(s): "TSH", "FREET4" in the last 168 hours.  BNP Recent Labs  Lab 01/01/22 0453  BNP  2,040.8*    DDimer No results for input(s): "DDIMER" in the last 168 hours.   Radiology/Studies:  [facility] Chest Port 1 View  Result Date: 01/01/2022 CLINICAL DATA:  Shortness of breath. EXAM: PORTABLE CHEST 1 VIEW COMPARISON:  09/12/2021 FINDINGS: 0456 hours. Cardiopericardial silhouette is at upper limits of normal for size. Permanent pacemaker/AICD noted. Diffuse interstitial opacity is progressive in the interval suggesting edema. Increased prominence of the right hilum in taking into account the diffuse interstitial opacity seen on the current study. No substantial pleural effusion. Curvilinear density in the parahilar right lung is new in the interval and indeterminate, potentially external to the patient. External to the patient. Bones are diffusely demineralized. Telemetry leads overlie the chest. IMPRESSION: Interval progression of diffuse interstitial opacity suggesting pulmonary edema. Fullness in the right hilum and infrahilar region is mildly progressive in the interval, potentially related to vascular congestion and superimposed edema. Follow-up dedicated PA and lateral chest x-ray recommended  to further evaluate when the patient is able. Electronically Signed   By: Misty Stanley M.D.   On: 01/01/2022 05:06     Assessment and Plan:   Acute on chronic systolic CHF with NICM.  Elevated troponin to 63 with demand ischemia will check one more.  No chest pain.  Agree with IV Bumex but monitor BP closely may only tolerate once per day.  Continue entresto if possible,  intolerant to farxiga and spironolactone. She is on coreg 3.125 BID  NICM ongoing since 2013 with ICD upgraded to [place] in 2020.  Last cath 2020 with no CAD.  Intermittent LBBB. Present on admit. Has BiV ICD stable no discharge per pt HR improved from ST on admit.  Hx of syncope and believes she went out 3 times in ambulance may be due to hypoxia.   Risk Assessment/Risk Scores:        New York Heart Association (NYHA) Functional Class NYHA Class III        For questions or updates, please contact [facility] Please consult [email or web address] for contact info under    Signed, Cecilie Kicks, NP  01/01/2022 2:17 PM  Attending note Patient seen and discussed with PA Dorene Ar, I agree with her documentation. 76 yo female history of NICM LVEF 25-30%, AICD, chronic LBBB, multiple medical allergies (28 listed) admitted with SOB. Significant SOB by EMS eval, on NRB and in ER progressed to bipap.    WBC 19.3 Hgb 14.4 Plt 273 K 5.5 BUN 22 Cr 1.21 BNP 2030 Pro calc: 0.47 ABG 7.16/50/88/18 Trop 16-->63    Acute on chronic systolic HF/Long history NICM - 09/2021 echo: LVE 25-30%, normal RV, mod MR - BNP 2000, was 636 3 months ago. CXR pulm edema. - medical therapy limited by multiple medication allergies (28 listed). ONly on coreg, entreato at home.  - she stopped torsemide on her own last week  -reported lasix allergy with hypotension though on torsemide at home - given IV bumex in ER 0.'5mg'$  with second dose pending in evening. SOme soft bp's but tolerating. 210m in pure wicc but did have urine spill into bed from  catheter so inaccurate mesure.  - redose bumex 0.'5mg'$  IV x 1 early evening, reasssess tomorrow for dosing, have not ordered scheduled doses.  - hold home entresto given soft bp's, continue home coreg - wean bipap as tolerated, currently breathing comfortable on    2. Elevated WBC and procalcitonin - unclear if xray purely edema or possible pneumonia component - started on abx per primary team -  likely her symptoms are both HF and pneumonia - PA/Lateral CXR when more stable respiratory wise  3. Elevated trop - mild in setting of severe hypoxia, acidosis, HF - follow trend. EKG chronic LBBB   Carlyle Dolly MD

## 2022-01-01 NOTE — ED Notes (Signed)
Called phlebotomy to obtain blood work

## 2022-01-01 NOTE — H&P (Addendum)
History and Physical    Patient: Ashley [name] EXN:[id number] DOB: Nov 29, 1945 DOA: 01/01/2022 DOS: the patient was seen and examined on 01/01/2022 PCP: [name], MD  Patient coming from: Home  Chief Complaint:  Chief Complaint  Patient presents with   Shortness of Breath   HPI: Ashley [name] is a 76 y.o. 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  Over the last 3 nights she had been wearing her oxygen due to feeling like she was suffocating while sleeping.  She has had a productive cough with clear sputum.  Denies having any change in her weight, leg swelling, fever, or chills.  Patient reports taking all of her medications as prescribed, and not drinking more than 3 glasses of fluid per day on average.  She does report having some substernal/epigastric discomfort, pinch like pain over her pacemaker, pain dryness in her left eye, discomfort in both ears with roaring sound, and constipation for which she manually disimpacted herself yesterday evening when she noticed blood present.   Upon admission into the emergency department patient was noted to be tachycardic and tachypneic with blood pressures maintained.  She was placed on BiPAP due to respiratory distress with improvement in symptoms.ABG revealed pH 7.16, PCO2 50.6, and PO2 88 for which   Labs significant for WBC 19.3 potassium 5.5, CO2 11, and anion gap 18.  Chest x-ray noted diffuse interstitial opacity concerning for pulmonary edema.  They had discussed giving the patient IV Lasix which she refused due to prior hospitalization where her blood pressures drop significantly after receiving the medication.  She adamantly declined and had been prescribed  torsemide 20 mg p.o.  The medication had not been given as she was on BiPAP.  Review of Systems: As mentioned in the history of present illness. All other systems reviewed and are negative. Past Medical History:  Diagnosis Date   Abdominal aortic atherosclerosis ([place]) 11/2015   by CT   Allergy    Anemia    Anxiety    Automatic implantable cardioverter-defibrillator in situ    a. 2012 s/p MDT single lead ICD; b. 10/2018 BiV upgrade-->MDT ser # [id number] H.   Bronchiectasis    a. possibly mild, treat URIs aggressively   Cancer ([place])    basal cell, arms & neck   Chronic sinusitis    COPD (chronic obstructive pulmonary disease) ([place])    COVID-19 12/06/2020   Depression    Dyspnea    Essential hypertension    Pt denies   Family history of adverse reaction to anesthesia    Mother - PONV   Fibrocystic breast disease    Fibromyalgia    GERD (gastroesophageal reflux disease)    H/O multiple allergies 10/10/2013   Hemorrhoids    HFrEF (heart failure with reduced ejection fraction) ([place])    a. 06/2011 cMRI: EF 34%, no LGE; b. 01/2016 Echo: EF 35-40%; c. 07/2016 Echo: EF 40-45%; d. 02/2020 Echo: EF 20-25%, glob HK. GrI DD. Mildly red RV fxn. Mildly dil LA. Triv MR.   History of diverticulitis of colon    History of pneumonia 2012   Insomnia    Irritable bowel syndrome    Laryngeal nodule    Dr. Thomasena Edis   Migraine without aura, without mention of intractable migraine without mention  of status migrainosus    Mitral regurgitation    Monoclonal gammopathy    Neuropathy    feet and hands   Nonischemic cardiomyopathy ([place])    a. 2012 s/p single lead ICD; b. 06/2011 cMRI: EF 34%, no LGE; b. 01/2016 Echo: EF 35-40%; c. 07/2016 Echo: EF 40-45%; d. 10/2018 Cath: nl cors. EF 20-25%; e. 10/2018 s/p BiV MDT ICD upgrade; f. 02/2020 Echo: EF 20-25%, glob HK. GrI DD.   Osteoarthritis    knees, back, hands, low back,    Osteoporosis 01/2013   T-2.7 spine (01/2016)   PONV (postoperative nausea and vomiting)     sigmoid diverticulitis with perforation, abscess and fistula 09/30/2013   Sleep apnea 2010   borderline, inconclusive- /w [place]    Past Surgical History:  Procedure Laterality Date   ABDOMINAL HYSTERECTOMY  1987   w/BSO   ANAL RECTAL MANOMETRY N/A 02/02/2016   Procedure: ANO RECTAL MANOMETRY;  Surgeon: Mauri Pole, MD;  Location: [facility];  Service: Endoscopy;  Laterality: N/A;   APPENDECTOMY     AUGMENTATION MAMMAPLASTY     BIV UPGRADE N/A 11/04/2018   Procedure: BIV ICD  UPGRADE;  Surgeon: Evans Lance, MD;  Location: [facility];  Service: Cardiovascular;  Laterality: N/A;   breast cystectomies     due to FCBD   BREAST MASS EXCISION  01/2010   Left-fibrocystic change w/intraductal papilloma, no malignancy   carotid US  35/5732   2-02% LICA, normal RICA   CATARACT EXTRACTION W/PHACO Right 03/08/2021   Procedure: CATARACT EXTRACTION PHACO AND INTRAOCULAR LENS PLACEMENT ([place]) RIGHT;  Surgeon: Birder Robson, MD;  Location: [facility];  Service: Ophthalmology;  Laterality: Right;  13.01 1:15.7   [place] Bunionectomy  11/04/08   Right Great Toe (Dr. Beola Cord)   COLONOSCOPY  01/2018   1 small benign polyp, diverticulosis, healthy anastomosis [name] @ [facility])   COLOSTOMY N/A 10/02/2013   DESCENDING COLOSTOMY;  Surgeon: Imogene Burn. [place], MD   COLOSTOMY REVERSAL N/A 02/10/2014   [name], MD   EMG/MCV  10/16/01   + mild carpal tunnel   ESOPHAGOGASTRODUODENOSCOPY  09/2014   small HH, irregular Z line, fundic gland polyps with mild gastritis Olevia Perches)   Flex laryngoscopy  06/11/06   ([place]) nml   IMPLANTABLE CARDIOVERTER DEFIBRILLATOR IMPLANT N/A 07/14/2011   MDT single chamber ICD   KNEE ARTHROSCOPY W/ ORIF     Left; "meniscus tear"   LASIK     left eye   MANDIBLE SURGERY     "got about 6 pins in the bottom of my jaw"   NCS  11/2014   L ulnar neuropathy, mild B median nerve entrapment ([place])   OSTEOTOMY     Left foot   PARTIAL COLECTOMY N/A  10/02/2013   PARTIAL COLECTOMY;  Surgeon: Imogene Burn. [place], MD   RIGHT/LEFT HEART CATH AND CORONARY ANGIOGRAPHY N/A 10/16/2018   Procedure: RIGHT/LEFT HEART CATH AND CORONARY ANGIOGRAPHY;  Surgeon: Wellington Hampshire, MD;  Location: [facility];  Service: Cardiovascular;  Laterality: N/A;   sinus surgery     x3 with balloon    TOE AMPUTATION     "toe beside baby toe on left foot; got gangrene from corn"   TONSILLECTOMY AND ADENOIDECTOMY     Social History:  reports that she has never smoked. She has never used smokeless tobacco. She reports that she does not drink alcohol and does not use drugs.  Allergies  Allergen Reactions   Atorvastatin  Other (See Comments)    Severe muscle cramps to generic atorvastatin.  Able to take brand lipitor.   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    Influenza Vac Split [Influenza Virus Vaccine] Other (See Comments)    "allergic to concentrated eggs that are in vaccine"   Latex Nausea Only, Rash and Other (See Comments)    Pain and infection   Morphine And Related Other (See Comments)    Internally feels like she is on fire    Pravastatin Other (See Comments)    myalgias   Prevacid [Lansoprazole] Other (See Comments)    Worsened GI side effects   Prevnar [Pneumococcal 13-Val Conj Vacc] Other (See Comments)    Egg allergy Bad reaction after shot   Rosuvastatin Other (See Comments)    Was not effective controlling  lipids   Simvastatin Other (See Comments)    Leg cramps   Tape Other (See Comments) and Rash    All adhesives  - Blisters, itching and burning  blistering   Farxiga [Dapagliflozin]    Metaxalone    Tramadol     Leg cramps   Antihistamines, Chlorpheniramine-Type Other (See Comments)    Alters vision   Betadine [Povidone Iodine] Rash    Per pt.    Onion Diarrhea and Other (See Comments)    GI distress, flared IBS   Other Hives and Rash    NOTE: pt is able to take cephalosporins without reaction   Penicillins Other (See Comments)    CHILDHOOD ALLEGY/REACTION: "told 50 years ago I couldn't take it; might be immune to it", able to take amoxicillin Has patient had a PCN reaction causing immediate rash, facial/tongue/throat swelling, SOB or lightheadedness with hypotension: Unknown Has patient had a PCN reaction causing severe rash involving mucus membranes or skin necrosis: UnknowN Has patient had a PCN reaction that required hospitalization: /Unknown Has patient had a PCN reaction occurring within the last 10 years: Unknown If a   Red Dye Nausea Only and Swelling   Sertraline Other (See Comments)    Overly sedating   Sulfasalazine Other (See Comments)    "makes my whole body smell like sulfa & makes me sick just smelling it"    Family History  Problem Relation Age of Onset   Lung cancer Father        + smoker   Colon polyps Father    Dementia Mother    Osteoporosis Mother        Lumbar spine   Stroke Mother        x 5 @ 34 YOA   Arthritis Mother        hands   Emphysema Mother    Alcohol abuse Paternal Grandfather    Stroke Paternal Grandfather        ?   Heart disease Paternal Grandfather    Hypertension Maternal Grandfather        ?  Heart disease Paternal Grandmother    Colon cancer Neg Hx    Esophageal cancer Neg Hx    Gallbladder disease Neg Hx     Prior to Admission medications   Medication Sig Start Date End Date Taking? Authorizing Provider   acetaminophen (TYLENOL) 500 MG tablet Take 500 mg by mouth daily as needed for moderate pain or headache (PAIN).     [provider]  AMBULATORY NON FORMULARY MEDICATION Medication Name:  1 squatty potty to use as needed 01/20/16   Mauri Pole, MD  Ascorbic Acid (VITAMIN C) 1000 MG tablet Take 1,000 mg by mouth See admin instructions. Take 1000 mg daily, may take an additional 1000 mg as needed for cold symptoms    [provider]  aspirin EC 81 MG tablet Take 81 mg by mouth at bedtime.     [provider]  beclomethasone (QVAR) 80 MCG/ACT inhaler Inhale 2 puffs into the lungs 2 (two) times daily. 03/04/21   Parrett, Fonnie Mu, NP  benzonatate (TESSALON) 200 MG capsule Take 1 capsule (200 mg total) by mouth 3 (three) times daily as needed for cough. 12/31/20   [name], MD  Biotin 10 MG CAPS Take 1 tablet by mouth daily.    [provider]  carvedilol (COREG) 6.25 MG tablet TAKE 0.5 TABLETS (3.125 MG TOTAL) BY MOUTH 2 (TWO) TIMES DAILY WITH A MEAL. 10/17/21   Gollan, Kathlene November, MD  Cholecalciferol (VITAMIN D3) 50 MCG (2000 UT) capsule Take 1 capsule (2,000 Units total) by mouth daily. 04/15/21   [name], MD  Coenzyme Q10 (COQ-10) 100 MG CAPS Take 100 mg by mouth daily.    [provider]  COLLAGEN PO Take by mouth daily.    [provider]  dextromethorphan (DELSYM) 30 MG/5ML liquid Take 15 mg by mouth daily as needed for cough.     [provider]  ezetimibe (ZETIA) 10 MG tablet TAKE 1 TABLET BY MOUTH EVERY DAY 05/03/21   Minna Merritts, MD  GARLIC PO Take 1 capsule by mouth daily.    [provider]  guaiFENesin (MUCINEX) 600 MG 12 hr tablet Take 600 mg by mouth 2 (two) times daily.    [provider]  levalbuterol Penne Lash HFA) 45 MCG/ACT inhaler Inhale 2 puffs into the lungs every 6 (six) hours as needed for wheezing or shortness of breath. 09/15/21   Furth, Cadence H, PA-C  LORazepam (ATIVAN)  0.5 MG tablet Take 0.5-1 tablets (0.25-0.5 mg total) by mouth at bedtime as needed for anxiety or sleep. 03/22/20   [name], MD  magnesium 30 MG tablet Take 30 mg by mouth daily.    [provider]  Menthol, Topical Analgesic, (BIOFREEZE ROLL-ON EX) Apply topically as needed.    [provider]  miconazole (MICOTIN) 2 % powder Apply 1 application topically as needed (yeast).    [provider]  nystatin cream (MYCOSTATIN) Apply 1 application. topically as needed for dry skin (yeast infection). 10/14/21   [name], MD  Omega-3 Fatty Acids (FISH OIL) 1000 MG CAPS Take 1 capsule (1,000 mg total) by mouth daily. 12/04/20   Patrecia Pour, MD  OXYGEN Inhale 2 L into the lungs at bedtime.    [provider]  potassium chloride (KLOR-CON) 10 MEQ tablet Take 1 tablet (10 mEq total) by mouth 3 (three) times a week. 01/05/21   Theora Gianotti, NP  pyridOXINE (VITAMIN B-6) 100 MG tablet Take 100 mg by mouth daily.  [provider]  Red Yeast Rice Extract (RED YEAST RICE PO) Take 1 capsule by mouth in the morning and at bedtime.    [provider]  sacubitril-valsartan (ENTRESTO) 49-51 MG Take 1 tablet by mouth daily. 05/26/21   Baldwin Jamaica, PA-C  spironolactone (ALDACTONE) 25 MG tablet Take 1 tablet (25 mg total) by mouth daily. 01/05/21   Theora Gianotti, NP  torsemide (DEMADEX) 10 MG tablet Take 1 tablet by mouth twice daily every other day. 12/12/21   Minna Merritts, MD  vitamin E 400 UNIT capsule Take 400 Units by mouth daily.    [provider]  bisoprolol (ZEBETA) 5 MG tablet Take 5 mg by mouth daily. 06/06/11 07/02/18  Minna Merritts, MD    Physical Exam: Vitals:   01/01/22 0535 01/01/22 0540 01/01/22 0600 01/01/22 0645  BP:   125/76   Pulse:   (!) 108   Resp: (!) 30  (!) 24 (!) 22  SpO2:   97% 99%  Weight:  84.1 kg    Height:  '5\' 5"'$  (1.651 m)       Constitutional: Early female who  appears to be in no acute distress at this time Eyes: PERRL, lids and conjunctivae normal ENMT: Mucous membranes are moist.  Fluid appears present behind tympanic membranes Neck: normal, supple Respiratory: Respiratory effort with crackles noted in the mid to lower lung fields.  Patient currently on BiPAP Cardiovascular: Regular rate and rhythm, no murmurs / rubs / gallops.  Pacemaker noted on the left upper chest wall.  Trace lower extremity edema.   Abdomen: Distended abdomen with left more distended than the right, and healed midline surgical scar.  Bowel sounds present. Musculoskeletal: no clubbing / cyanosis. No joint deformity upper and lower extremities. Normal muscle tone.  Skin: no rashes, lesions, ulcers. No induration Neurologic: CN 2-12 grossly intact.   Strength 5/5 in all 4.  Psychiatric: Normal judgment and insight. Alert and oriented x 3. Anxious mood.   Data Reviewed:  Sinus tachycardia 134 bpm with left bundle branch block.  LBBB is not new.  Reviewed labs imaging and pertinent records as noted above.  Assessment and Plan: Acute on chronic respiratory failure with hypercapnia and hypoxia secondary to combined systolic and diastolic CHF exacerbation Patient presented with complaints of progressively worsening shortness of breath.  She has been using home oxygen at night due to orthopnea.  Due to her respiratory distress she was placed on CPAP in route with EMS and switched to BiPAP in the ED.  ABG noted pH 7.16 with PCO2 51, PO2 88 on BiPAP.  Appears to have some mixed acidosis.  Patient was noted to have crackles on physical exam.  BNP elevated 2040.8 last echocardiogram 09/2021 noted EF to be 25 to 30% with indeterminate diastolic parameters, and moderate mitral mitral regurgitation. -Admit to a progressive bed -Continuous pulse oximetry with oxygen maintain O2 saturations greater than 92% -BiPAP -Strict I&O's and daily weights -Bumex 0.5 mg IV twice daily, unless otherwise by  cardiology -Continue to monitor and this regimen as needed -Zoom beta-blocker when medically appropriate -Appreciate cardiology consultative services, will follow-up for any further recommendations  SIRS Leukocytosis Patient was noted to have leukocytosis with tachycardia and tachypnea meeting SIRS criteria.  Chest x-ray noted infiltrates thought to be edema more so than infection.  Due to patient's elevated white blood cell count question possibility of underlying infection. -Check procalcitonin -Check blood cultures and lactic acid -Check urinalysis -Rocephin IV.  Reassess  in determine need of broadening antibiotics, but note patient has multiple allergies  Metabolic acidosis with increased anion gap Acute.  On admission CO2 noted to be 11 with anion gap of 18. -Continue to monitor  Hyperkalemia Acute.  Potassium elevated at 5.5 on initial blood work. -Recheck potassium in a.m.  Chest discomfort Patient initially noted to be tachycardic with bundle branch block.  High-sensitivity troponin was within normal limits. -Continue to trend troponin  Asthma, without acute exacerbation Patient without significant wheezing noted at this time. -Continue pharmacy substitution of budesonide for Qvar -Levalbuterol nebs as needed for shortness of breath or wheezing  Essential hypertension Blood pressures seem to be around 125/76 currently.  Home medication regimen includes Coreg 3.125 mg twice daily, spironolactone 25 mg daily, Entresto 49-51 mg daily, and torsemide 10 mg twice daily every other day. -Continue Coreg, Entresto, and spironolactone once medically appropriate  Constipation Chronic.  Patient reports that she has been constipated and had to manually disimpact herself yesterday evening.Marland Kitchen  Has a prior history of perforated diverticulitis which patient reports issues with constipation.  Patient noted to have prior history of hemorrhoids -Monitor intake and output -Hydrocortisone  suppositories as needed for hemorrhoids  Nonischemic cardiomyopathy s/p AICD/pacemaker -Orders placed for nursing to interrogate [employer]  Renal insufficiency On admission creatinine 1.21.  Baseline creatinine previously had been around 0.8-0.9. -Monitor kidney function with diuresis  Hyperglycemia Acute.  Glucose elevated at 249.  Possibly secondary to acute stress response. -Check hemoglobin A1c  Anxiety Home medication includes Ativan. -Ativan IV as needed until can be switched back to p.o.  Hyperlipidemia -Resume Zetia once able  Obesity BMI 30.85 kg/m   Advance Care Planning:   Code Status: Full Code   Consults: Cardiology  Family Communication: Husband updated at bedside  Severity of Illness: The appropriate patient status for this patient is INPATIENT. Inpatient status is judged to be reasonable and necessary in order to provide the required intensity of service to ensure the patient's safety. The patient's presenting symptoms, physical exam findings, and initial radiographic and laboratory data in the context of their chronic comorbidities is felt to place them at high risk for further clinical deterioration. Furthermore, it is not anticipated that the patient will be medically stable for discharge from the hospital within 2 midnights of admission.   * I certify that at the point of admission it is my clinical judgment that the patient will require inpatient hospital care spanning beyond 2 midnights from the point of admission due to high intensity of service, high risk for further deterioration and high frequency of surveillance required.*  Author: Norval Morton, MD 01/01/2022 7:48 AM  For on call review [email or web address].

## 2022-01-01 NOTE — ED Provider Notes (Signed)
[facility] EMERGENCY DEPARTMENT Provider Note   CSN: [id number] Arrival date & time: 01/01/22  0444     History  Chief Complaint  Patient presents with   Shortness of [place] is a 76 y.o. female.  The history is provided by the patient and medical records.   76 y.o. F with hx of anemia, COPD, CHF, GERD, hypertension, hyperlipidemia, presenting to the ED with shortness of breath.  Called EMS for SOB all day today, worse early this AM.  She did decompensate en route, attempted CPAP but did not tolerate with EMS.  On arrival, patient grunting and appears distressed on NRB.  Respiratory at bedside placing bipap.  Home Medications Prior to Admission medications   Medication Sig Start Date End Date Taking? Authorizing Provider  acetaminophen (TYLENOL) 500 MG tablet Take 500 mg by mouth daily as needed for moderate pain or headache (PAIN).     [provider]  AMBULATORY NON FORMULARY MEDICATION Medication Name:  1 squatty potty to use as needed 01/20/16   Mauri Pole, MD  Ascorbic Acid (VITAMIN C) 1000 MG tablet Take 1,000 mg by mouth See admin instructions. Take 1000 mg daily, may take an additional 1000 mg as needed for cold symptoms    [provider]  aspirin EC 81 MG tablet Take 81 mg by mouth at bedtime.     [provider]  beclomethasone (QVAR) 80 MCG/ACT inhaler Inhale 2 puffs into the lungs 2 (two) times daily. 03/04/21   Parrett, Fonnie Mu, NP  benzonatate (TESSALON) 200 MG capsule Take 1 capsule (200 mg total) by mouth 3 (three) times daily as needed for cough. 12/31/20   [name], MD  Biotin 10 MG CAPS Take 1 tablet by mouth daily.    [provider]  carvedilol (COREG) 6.25 MG tablet TAKE 0.5 TABLETS (3.125 MG TOTAL) BY MOUTH 2 (TWO) TIMES DAILY WITH A MEAL. 10/17/21   Gollan, Kathlene November, MD  Cholecalciferol (VITAMIN D3) 50 MCG (2000 UT) capsule Take 1 capsule (2,000 Units total) by mouth  daily. 04/15/21   [name], MD  Coenzyme Q10 (COQ-10) 100 MG CAPS Take 100 mg by mouth daily.    [provider]  COLLAGEN PO Take by mouth daily.    [provider]  dextromethorphan (DELSYM) 30 MG/5ML liquid Take 15 mg by mouth daily as needed for cough.     [provider]  ezetimibe (ZETIA) 10 MG tablet TAKE 1 TABLET BY MOUTH EVERY DAY 05/03/21   Minna Merritts, MD  GARLIC PO Take 1 capsule by mouth daily.    [provider]  guaiFENesin (MUCINEX) 600 MG 12 hr tablet Take 600 mg by mouth 2 (two) times daily.    [provider]  levalbuterol Penne Lash HFA) 45 MCG/ACT inhaler Inhale 2 puffs into the lungs every 6 (six) hours as needed for wheezing or shortness of breath. 09/15/21   Furth, Cadence H, PA-C  LORazepam (ATIVAN) 0.5 MG tablet Take 0.5-1 tablets (0.25-0.5 mg total) by mouth at bedtime as needed for anxiety or sleep. 03/22/20   [name], MD  magnesium 30 MG tablet Take 30 mg by mouth daily.    [provider]  Menthol, Topical Analgesic, (BIOFREEZE ROLL-ON EX) Apply topically as needed.    [provider]  miconazole (MICOTIN) 2 % powder Apply 1 application topically as needed (yeast).    [provider]  nystatin cream (MYCOSTATIN) Apply 1 application.  topically as needed for dry skin (yeast infection). 10/14/21   [name], MD  Omega-3 Fatty Acids (FISH OIL) 1000 MG CAPS Take 1 capsule (1,000 mg total) by mouth daily. 12/04/20   Patrecia Pour, MD  OXYGEN Inhale 2 L into the lungs at bedtime.    [provider]  potassium chloride (KLOR-CON) 10 MEQ tablet Take 1 tablet (10 mEq total) by mouth 3 (three) times a week. 01/05/21   Theora Gianotti, NP  pyridOXINE (VITAMIN B-6) 100 MG tablet Take 100 mg by mouth daily.    [provider]  Red Yeast Rice Extract (RED YEAST RICE PO) Take 1 capsule by mouth in the morning and at bedtime.    [provider]   sacubitril-valsartan (ENTRESTO) 49-51 MG Take 1 tablet by mouth daily. 05/26/21   Baldwin Jamaica, PA-C  spironolactone (ALDACTONE) 25 MG tablet Take 1 tablet (25 mg total) by mouth daily. 01/05/21   Theora Gianotti, NP  torsemide (DEMADEX) 10 MG tablet Take 1 tablet by mouth twice daily every other day. 12/12/21   Minna Merritts, MD  vitamin E 400 UNIT capsule Take 400 Units by mouth daily.    [provider]  bisoprolol (ZEBETA) 5 MG tablet Take 5 mg by mouth daily. 06/06/11 07/02/18  Minna Merritts, MD      Allergies    Atorvastatin; Chlorhexidine; Codeine; Cyclobenzaprine; Cymbalta [duloxetine hcl]; Diflucan [fluconazole]; Dilaudid [hydromorphone hcl]; Furosemide; Influenza vac split [influenza virus vaccine]; Latex; Morphine and related; Pravastatin; Prevacid [lansoprazole]; Prevnar [pneumococcal 13-val conj vacc]; Rosuvastatin; Simvastatin; Tape; Farxiga [dapagliflozin]; Metaxalone; Tramadol; Antihistamines, chlorpheniramine-type; Betadine [povidone iodine]; Onion; Other; Penicillins; Red dye; Sertraline; and Sulfasalazine    Review of Systems   Review of Systems  Respiratory:  Positive for shortness of breath.   All other systems reviewed and are negative.   Physical Exam Updated Vital Signs BP (!) 160/93   Pulse (!) 132   Resp (!) 30   SpO2 100%   Physical Exam Vitals and nursing note reviewed.  Constitutional:      General: She is in acute distress.     Appearance: She is well-developed. She is diaphoretic.  HENT:     Head: Normocephalic and atraumatic.  Eyes:     Conjunctiva/sclera: Conjunctivae normal.     Pupils: Pupils are equal, round, and reactive to light.  Cardiovascular:     Rate and Rhythm: Normal rate and regular rhythm.     Heart sounds: Normal heart sounds.  Pulmonary:     Effort: Tachypnea, accessory muscle usage and respiratory distress present.     Breath sounds: Rales present.     Comments: Distressed, grunting, rales  throughout, tolerating bipap currently Abdominal:     General: Bowel sounds are normal.     Palpations: Abdomen is soft.  Musculoskeletal:        General: Normal range of motion.     Cervical back: Normal range of motion.     Comments: Mild edema noted at the ankles, pulses intact BLE  Skin:    General: Skin is warm.  Neurological:     Mental Status: She is alert and oriented to person, place, and time.     ED Results / Procedures / Treatments   Labs (all labs ordered are listed, but only abnormal results are displayed) Labs Reviewed  CBC WITH DIFFERENTIAL/PLATELET - Abnormal; Notable for the following components:      Result Value   WBC 19.3 (*)    HCT  46.2 (*)    Neutro Abs 12.9 (*)    Lymphs Abs 4.5 (*)    Monocytes Absolute 1.5 (*)    Abs Immature Granulocytes 0.11 (*)    All other components within normal limits  BASIC METABOLIC PANEL - Abnormal; Notable for the following components:   Potassium 5.5 (*)    CO2 11 (*)    Glucose, Bld 249 (*)    Creatinine, Ser 1.21 (*)    GFR, Estimated 46 (*)    Anion gap 18 (*)    All other components within normal limits  BRAIN NATRIURETIC PEPTIDE - Abnormal; Notable for the following components:   B Natriuretic Peptide 2,040.8 (*)    All other components within normal limits  I-STAT ARTERIAL BLOOD GAS, ED - Abnormal; Notable for the following components:   pH, Arterial 7.160 (*)    pCO2 arterial 50.6 (*)    Bicarbonate 18.1 (*)    TCO2 20 (*)    Acid-base deficit 11.0 (*)    All other components within normal limits  TROPONIN I (HIGH SENSITIVITY)  TROPONIN I (HIGH SENSITIVITY)    EKG EKG Interpretation  Date/Time:  Sunday January 01 2022 04:48:33 EDT Ventricular Rate:  134 PR Interval:  105 QRS Duration: 160 QT Interval:  316 QTC Calculation: 472 R Axis:   -20 Text Interpretation: Sinus tachycardia Left bundle branch block Confirmed by Randal Buba, April ([id number]) on 01/01/2022 4:52:12 AM  Radiology DG Chest Port 1  View  Result Date: 01/01/2022 CLINICAL DATA:  Shortness of breath. EXAM: PORTABLE CHEST 1 VIEW COMPARISON:  09/12/2021 FINDINGS: 0456 hours. Cardiopericardial silhouette is at upper limits of normal for size. Permanent pacemaker/AICD noted. Diffuse interstitial opacity is progressive in the interval suggesting edema. Increased prominence of the right hilum in taking into account the diffuse interstitial opacity seen on the current study. No substantial pleural effusion. Curvilinear density in the parahilar right lung is new in the interval and indeterminate, potentially external to the patient. External to the patient. Bones are diffusely demineralized. Telemetry leads overlie the chest. IMPRESSION: Interval progression of diffuse interstitial opacity suggesting pulmonary edema. Fullness in the right hilum and infrahilar region is mildly progressive in the interval, potentially related to vascular congestion and superimposed edema. Follow-up dedicated PA and lateral chest x-ray recommended to further evaluate when the patient is able. Electronically Signed   By: Misty Stanley M.D.   On: 01/01/2022 05:06    Procedures Procedures    CRITICAL CARE Performed by: Larene Pickett   Total critical care time: 45 minutes  Critical care time was exclusive of separately billable procedures and treating other patients.  Critical care was necessary to treat or prevent imminent or life-threatening deterioration.  Critical care was time spent personally by me on the following activities: development of treatment plan with patient and/or surrogate as well as nursing, discussions with consultants, evaluation of patient's response to treatment, examination of patient, obtaining history from patient or surrogate, ordering and performing treatments and interventions, ordering and review of laboratory studies, ordering and review of radiographic studies, pulse oximetry and re-evaluation of patient's  condition.   Medications Ordered in ED Medications  torsemide (DEMADEX) tablet 20 mg (has no administration in time range)    ED Course/ Medical Decision Making/ A&P                           Medical Decision Making Amount and/or Complexity of Data Reviewed Labs: ordered. Radiology: ordered  and independent interpretation performed. ECG/medicine tests: ordered and independent interpretation performed.  Risk Prescription drug management. Decision regarding hospitalization.   76 year old female presenting to the ED with shortness of breath over the past 24 hours, gradually worsening.  EMS attempted CPAP, however unable to tolerate in route.  On arrival she is distressed, diaphoretic, and grunting.  She has diffuse rales on exam and some mild peripheral edema.  Suspect CHF exacerbation.  RT able to place BiPAP and tolerating well at this time.  Labs sent, portable chest x-ray with findings of pulmonary edema.  Will give IV Lasix.  5:13 AM Patient refusing to even attempt small dose of lasix due to past adverse reactions/intolerance (hypotension).  It was explained to her that given her current state, she does need rapid diuresis.  She is still refusing, becomes agitated and ripped off bipap mask.  She was calmed and mask reapplied.  She is willing to take additional dose of demedex so will give that instead.  She is aware this will take much longer to take effect.  Breathing has improved with bipap.  Husband now at bedside.  Labs as above-- leukocytosis noted.  Denies recent cough/congestion or fever prior to the last 24 hours.  May be stress response given her distress upon arrival.  Does appear to have an acidosis on chemistry.  BNP 2000+.  Troponin WNL.    Hospitalist paged for admission overnight--  advised patient "too sick" and needs to wait for daytime admitter.  Clinically, patient improved as RR and tachycardia improved, BP stabilized, and is comfortable on bipap.  7:20 AM Spoke  with Dr. Tamala Julian-- has concerns about management given she is unwilling to take lasix.  Will evaluate patient, plan to admit to SDU, get input from cardiology.  Final Clinical Impression(s) / ED Diagnoses Final diagnoses:  Acute pulmonary edema [facility])    Rx / DC Orders ED Discharge Orders     None         Larene Pickett, PA-C 01/01/22 0712    Palumbo, April, MD 01/05/22 2302

## 2022-01-01 NOTE — ED Provider Notes (Signed)
I provided a substantive portion of the care of this patient.  I personally performed the entirety of the exam for this encounter.  EKG Interpretation  Date/Time:  Sunday January 01 2022 04:48:33 EDT Ventricular Rate:  134 PR Interval:  105 QRS Duration: 160 QT Interval:  316 QTC Calculation: 472 R Axis:   -20 Text Interpretation: Sinus tachycardia Left bundle branch block Confirmed by Jorey Dollard ([id number]) on 01/01/2022 4:52:12 AM    Seen and examined  PERRL, EOMI  On BIPAP RRR Rales B on exam NABS, soft Pedal edema  Results for orders placed or performed during the hospital encounter of 01/01/22  CBC with Differential  Result Value Ref Range   WBC 19.3 (H) 4.0 - 10.5 K/uL   RBC 4.75 3.87 - 5.11 MIL/uL   Hemoglobin 14.4 12.0 - 15.0 g/dL   HCT 46.2 (H) 36.0 - 46.0 %   MCV 97.3 80.0 - 100.0 fL   MCH 30.3 26.0 - 34.0 pg   MCHC 31.2 30.0 - 36.0 g/dL   RDW 13.2 11.5 - 15.5 %   Platelets 273 150 - 400 K/uL   nRBC 0.0 0.0 - 0.2 %   Neutrophils Relative % 67 %   Neutro Abs 12.9 (H) 1.7 - 7.7 K/uL   Lymphocytes Relative 23 %   Lymphs Abs 4.5 (H) 0.7 - 4.0 K/uL   Monocytes Relative 8 %   Monocytes Absolute 1.5 (H) 0.1 - 1.0 K/uL   Eosinophils Relative 1 %   Eosinophils Absolute 0.2 0.0 - 0.5 K/uL   Basophils Relative 0 %   Basophils Absolute 0.1 0.0 - 0.1 K/uL   Immature Granulocytes 1 %   Abs Immature Granulocytes 0.11 (H) 0.00 - 0.07 K/uL  Basic metabolic panel  Result Value Ref Range   Sodium 136 135 - 145 mmol/L   Potassium 5.5 (H) 3.5 - 5.1 mmol/L   Chloride 107 98 - 111 mmol/L   CO2 11 (L) 22 - 32 mmol/L   Glucose, Bld 249 (H) 70 - 99 mg/dL   BUN 22 8 - 23 mg/dL   Creatinine, Ser 1.21 (H) 0.44 - 1.00 mg/dL   Calcium 10.0 8.9 - 10.3 mg/dL   GFR, Estimated 46 (L) >60 mL/min   Anion gap 18 (H) 5 - 15  Brain natriuretic peptide  Result Value Ref Range   B Natriuretic Peptide 2,040.8 (H) 0.0 - 100.0 pg/mL  I-Stat arterial blood gas, ED [facility] ED only)  Result  Value Ref Range   pH, Arterial 7.160 (LL) 7.35 - 7.45   pCO2 arterial 50.6 (H) 32 - 48 mmHg   pO2, Arterial 88 83 - 108 mmHg   Bicarbonate 18.1 (L) 20.0 - 28.0 mmol/L   TCO2 20 (L) 22 - 32 mmol/L   O2 Saturation 94 %   Acid-base deficit 11.0 (H) 0.0 - 2.0 mmol/L   Sodium 136 135 - 145 mmol/L   Potassium 4.5 3.5 - 5.1 mmol/L   Calcium, Ion 1.27 1.15 - 1.40 mmol/L   HCT 41.0 36.0 - 46.0 %   Hemoglobin 13.9 12.0 - 15.0 g/dL   Patient temperature 98.1 F    Collection site RADIAL, ALLEN'S TEST ACCEPTABLE    Drawn by RT    Sample type ARTERIAL    Comment NOTIFIED PHYSICIAN   Troponin I (High Sensitivity)  Result Value Ref Range   Troponin I (High Sensitivity) 16 <18 ng/L   *Note: Due to a large number of results and/or encounters for the requested time period,  some results have not been displayed. A complete set of results can be found in Results Review.   DG Chest Port 1 View  Result Date: 01/01/2022 CLINICAL DATA:  Shortness of breath. EXAM: PORTABLE CHEST 1 VIEW COMPARISON:  09/12/2021 FINDINGS: 0456 hours. Cardiopericardial silhouette is at upper limits of normal for size. Permanent pacemaker/AICD noted. Diffuse interstitial opacity is progressive in the interval suggesting edema. Increased prominence of the right hilum in taking into account the diffuse interstitial opacity seen on the current study. No substantial pleural effusion. Curvilinear density in the parahilar right lung is new in the interval and indeterminate, potentially external to the patient. External to the patient. Bones are diffusely demineralized. Telemetry leads overlie the chest. IMPRESSION: Interval progression of diffuse interstitial opacity suggesting pulmonary edema. Fullness in the right hilum and infrahilar region is mildly progressive in the interval, potentially related to vascular congestion and superimposed edema. Follow-up dedicated PA and lateral chest x-ray recommended to further evaluate when the patient is  able. Electronically Signed   By: Misty Stanley M.D.   On: 01/01/2022 05:06     Plan admit to medicine  See critical care note by Kennon Rounds, Deysi Soldo, MD 01/01/22 [phone]

## 2022-01-01 NOTE — ED Notes (Signed)
ED TO INPATIENT HANDOFF REPORT  ED Nurse Name and Phone #: Raquel Sarna RN [phone]  S Name/Age/Gender Ashley [name] 76 y.o. female Room/Bed: TRABC/TRABC  Code Status   Code Status: Full Code  Home/SNF/Other Home Patient oriented to: self, place, time, and situation Is this baseline? Yes   Triage Complete: Triage complete  Chief Complaint Acute respiratory failure with hypoxia and hypercapnia (HCC) [J96.01, J96.02]  Triage Note No notes on file   Allergies Allergies  Allergen Reactions   Chlorhexidine Other (See Comments)    blisters   Codeine Other (See Comments)    Confusion and dizziness   Cyclobenzaprine Other (See Comments)    Adverse reaction - back and throat pain, dizziness, exhaustion   Cymbalta [Duloxetine Hcl] Other (See Comments)    "body spasms and made me feel weird in my head"    Diflucan [Fluconazole] Nausea And Vomiting   Dilaudid [Hydromorphone Hcl] Other (See Comments)    Feels like she is burning internally    Influenza Vac Split [Influenza Virus Vaccine] Other (See Comments)    "allergic to concentrated eggs that are in vaccine"   Lasix [Furosemide] Other (See Comments)    Severe hypotension on lasix 40 PO (can handle 20 mg oral dosing) 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    Latex Nausea Only, Rash and Other (See Comments)    Pain and infection   Lipitor [Atorvastatin] Other (See Comments)    Severe muscle cramps to generic atorvastatin.  Able to take brand lipitor.   Morphine And Related Other (See Comments)    Internally feels like she is on fire    Pravastatin Other (See Comments)    myalgias   Prevacid [Lansoprazole] Other (See Comments)    Worsened GI side effects   Prevnar [Pneumococcal 13-Val Conj Vacc] Other (See Comments)    Egg allergy Bad reaction after shot   Rosuvastatin Other (See Comments)    Was not effective controlling lipids   Simvastatin Other (See Comments)    Leg cramps   Tape Other (See Comments) and Rash    All adhesives  -  Blisters, itching and burning  blistering   Farxiga [Dapagliflozin] Other (See Comments)    Unknown reaction    Metaxalone Other (See Comments)    Unknown reaction    Tramadol     Leg cramps   Antihistamines, Chlorpheniramine-Type Other (See Comments)    Alters vision   Betadine [Povidone Iodine] Rash    Per pt.    Onion Diarrhea and Other (See Comments)    GI distress, flared IBS   Other Hives and Rash    NOTE: pt is able to take cephalosporins without reaction   Penicillins Other (See Comments)    CHILDHOOD ALLEGY/REACTION: "told 50 years ago I couldn't take it; might be immune to it", able to take amoxicillin Has patient had a PCN reaction causing immediate rash, facial/tongue/throat swelling, SOB or lightheadedness with hypotension: Unknown Has patient had a PCN reaction causing severe rash involving mucus membranes or skin necrosis: UnknowN Has patient had a PCN reaction that required hospitalization: /Unknown Has patient had a PCN reaction occurring within the last 10 years: Unknown If a   Red [place] Nausea Only and Swelling   Sertraline Other (See Comments)    Overly sedating   Sulfasalazine Other (See Comments)    "makes my whole body smell like sulfa & makes me sick just smelling it"    Level of Care/Admitting Diagnosis ED Disposition     ED Disposition  [facility] Area: [place] [[id number]]  Level of Care: Progressive [102]  Admit to Progressive based on following criteria: RESPIRATORY PROBLEMS hypoxemic/hypercapnic respiratory failure that is responsive to NIPPV (BiPAP) or High Flow Nasal Cannula (6-80 lpm). Frequent assessment/intervention, no > Q2 hrs < Q4 hrs, to maintain oxygenation and pulmonary hygiene.  May admit patient to [facility] or [facility] if equivalent level of care is available:: No  Covid Evaluation: Asymptomatic - no recent exposure (last 10 days) testing not required  Diagnosis: Acute respiratory  failure with hypoxia and hypercapnia [facility]) [[id number]]  Admitting Physician: Norval Morton [[id number]]  Attending Physician: Norval Morton [[id number]]  Certification:: I certify this patient will need inpatient services for at least 2 midnights  Estimated Length of Stay: 4          B Medical/Surgery History Past Medical History:  Diagnosis Date   Abdominal aortic atherosclerosis ([place]) 11/2015   by CT   Allergy    Anemia    Anxiety    Automatic implantable cardioverter-defibrillator in situ    a. 2012 s/p MDT single lead ICD; b. 10/2018 BiV upgrade-->MDT ser # [id number] H.   Bronchiectasis    a. possibly mild, treat URIs aggressively   Cancer ([place])    basal cell, arms & neck   Chronic sinusitis    COPD (chronic obstructive pulmonary disease) ([place])    COVID-19 12/06/2020   Depression    Dyspnea    Essential hypertension    Pt denies   Family history of adverse reaction to anesthesia    Mother - PONV   Fibrocystic breast disease    Fibromyalgia    GERD (gastroesophageal reflux disease)    H/O multiple allergies 10/10/2013   Hemorrhoids    HFrEF (heart failure with reduced ejection fraction) ([place])    a. 06/2011 cMRI: EF 34%, no LGE; b. 01/2016 Echo: EF 35-40%; c. 07/2016 Echo: EF 40-45%; d. 02/2020 Echo: EF 20-25%, glob HK. GrI DD. Mildly red RV fxn. Mildly dil LA. Triv MR.   History of diverticulitis of colon    History of pneumonia 2012   Insomnia    Irritable bowel syndrome    Laryngeal nodule    Dr. Thomasena Edis   Migraine without aura, without mention of intractable migraine without mention of status migrainosus    Mitral regurgitation    Monoclonal gammopathy    Neuropathy    feet and hands   Nonischemic cardiomyopathy ([place])    a. 2012 s/p single lead ICD; b. 06/2011 cMRI: EF 34%, no LGE; b. 01/2016 Echo: EF 35-40%; c. 07/2016 Echo: EF 40-45%; d. 10/2018 Cath: nl cors. EF 20-25%; e. 10/2018 s/p BiV MDT ICD upgrade; f. 02/2020 Echo: EF 20-25%, glob HK. GrI DD.   Osteoarthritis     knees, back, hands, low back,    Osteoporosis 01/2013   T-2.7 spine (01/2016)   PONV (postoperative nausea and vomiting)    sigmoid diverticulitis with perforation, abscess and fistula 09/30/2013   Sleep apnea 2010   borderline, inconclusive- /w [place]    Past Surgical History:  Procedure Laterality Date   ABDOMINAL HYSTERECTOMY  1987   w/BSO   ANAL RECTAL MANOMETRY N/A 02/02/2016   Procedure: ANO RECTAL MANOMETRY;  Surgeon: Mauri Pole, MD;  Location: [facility];  Service: Endoscopy;  Laterality: N/A;   APPENDECTOMY     AUGMENTATION MAMMAPLASTY     BIV UPGRADE N/A 11/04/2018   Procedure: BIV  ICD  UPGRADE;  Surgeon: Evans Lance, MD;  Location: [facility];  Service: Cardiovascular;  Laterality: N/A;   breast cystectomies     due to FCBD   BREAST MASS EXCISION  01/2010   Left-fibrocystic change w/intraductal papilloma, no malignancy   carotid US  96/2229   7-98% LICA, normal RICA   CATARACT EXTRACTION W/PHACO Right 03/08/2021   Procedure: CATARACT EXTRACTION PHACO AND INTRAOCULAR LENS PLACEMENT ([place]) RIGHT;  Surgeon: Birder Robson, MD;  Location: [facility];  Service: Ophthalmology;  Laterality: Right;  13.01 1:15.7   [place] Bunionectomy  11/04/08   Right Great Toe (Dr. Beola Cord)   COLONOSCOPY  01/2018   1 small benign polyp, diverticulosis, healthy anastomosis [name] @ [facility])   COLOSTOMY N/A 10/02/2013   DESCENDING COLOSTOMY;  Surgeon: Imogene Burn. [place], MD   COLOSTOMY REVERSAL N/A 02/10/2014   [name], MD   EMG/MCV  10/16/01   + mild carpal tunnel   ESOPHAGOGASTRODUODENOSCOPY  09/2014   small HH, irregular Z line, fundic gland polyps with mild gastritis [facility] P[facility])   Flex laryngoscopy  06/11/06   ([place]) nml   IMPLANTABLE CARDIOVERTER DEFIBRILLATOR IMPLANT N/A 07/14/2011   MDT single chamber ICD   KNEE ARTHROSCOPY W/ ORIF     Left; "meniscus tear"   LASIK     left eye   MANDIBLE SURGERY     "got about 6 pins in the bottom of my jaw"    NCS  11/2014   L ulnar neuropathy, mild B median nerve entrapment ([place])   OSTEOTOMY     Left foot   PARTIAL COLECTOMY N/A 10/02/2013   PARTIAL COLECTOMY;  Surgeon: Imogene Burn. 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"   [place]       A IV Location/Drains/Wounds Patient Lines/Drains/Airways Status     Active Line/Drains/Airways     Name Placement date Placement time Site Days   Peripheral IV 01/01/22 20 G Left Forearm 01/01/22  0451  Forearm  less than 1   External Urinary Catheter 01/01/22  1159  --  less than 1   Incision (Closed) 03/08/21 Eye Right 03/08/21  1257  -- 299            Intake/Output Last 24 hours  Intake/Output Summary (Last 24 hours) at 01/01/2022 1654 Last data filed at 01/01/2022 1200 Gross per 24 hour  Intake 100.55 ml  Output --  Net 100.55 ml    Labs/Imaging Results for orders placed or performed during the hospital encounter of 01/01/22 (from the past 48 hour(s))  CBC with Differential     Status: Abnormal   Collection Time: 01/01/22  4:53 AM  Result Value Ref Range   WBC 19.3 (H) 4.0 - 10.5 K/uL   RBC 4.75 3.87 - 5.11 MIL/uL   Hemoglobin 14.4 12.0 - 15.0 g/dL   HCT 46.2 (H) 36.0 - 46.0 %   MCV 97.3 80.0 - 100.0 fL   MCH 30.3 26.0 - 34.0 pg   MCHC 31.2 30.0 - 36.0 g/dL   RDW 13.2 11.5 - 15.5 %   Platelets 273 150 - 400 K/uL  nRBC 0.0 0.0 - 0.2 %   Neutrophils Relative % 67 %   Neutro Abs 12.9 (H) 1.7 - 7.7 K/uL   Lymphocytes Relative 23 %   Lymphs Abs 4.5 (H) 0.7 - 4.0 K/uL   Monocytes Relative 8 %   Monocytes Absolute 1.5 (H) 0.1 - 1.0 K/uL   Eosinophils Relative 1 %   Eosinophils Absolute 0.2 0.0 -  0.5 K/uL   Basophils Relative 0 %   Basophils Absolute 0.1 0.0 - 0.1 K/uL   Immature Granulocytes 1 %   Abs Immature Granulocytes 0.11 (H) 0.00 - 0.07 K/uL    Comment: Performed at [facility] [address] [place], [place] [address]  Basic metabolic panel     Status: Abnormal   Collection Time: 01/01/22  4:53 AM  Result Value Ref Range   Sodium 136 135 - 145 mmol/L   Potassium 5.5 (H) 3.5 - 5.1 mmol/L   Chloride 107 98 - 111 mmol/L   CO2 11 (L) 22 - 32 mmol/L   Glucose, Bld 249 (H) 70 - 99 mg/dL    Comment: Glucose reference range applies only to samples taken after fasting for at least 8 hours.   BUN 22 8 - 23 mg/dL   Creatinine, Ser 1.21 (H) 0.44 - 1.00 mg/dL   Calcium 10.0 8.9 - 10.3 mg/dL   GFR, Estimated 46 (L) >60 mL/min    Comment: (NOTE) Calculated using the CKD-EPI Creatinine Equation (2021)    Anion gap 18 (H) 5 - 15    Comment: Performed at [facility] [address], [place], [place] [address]  Brain natriuretic peptide     Status: Abnormal   Collection Time: 01/01/22  4:53 AM  Result Value Ref Range   B Natriuretic Peptide 2,040.8 (H) 0.0 - 100.0 pg/mL    Comment: Performed at [facility] [address], [place], [place] [address]  Troponin I (High Sensitivity)     Status: None   Collection Time: 01/01/22  4:53 AM  Result Value Ref Range   Troponin I (High Sensitivity) 16 <18 ng/L    Comment: (NOTE) Elevated high sensitivity troponin I (hsTnI) values and significant  changes across serial measurements may suggest ACS but many other  chronic and acute conditions are known to elevate hsTnI results.  Refer to the "Links" section for chest pain algorithms and additional  guidance. Performed at [facility], [place] [address], [place],  [address]   I-Stat arterial blood gas, ED [facility] ED only)     Status: Abnormal   Collection Time: 01/01/22  5:02 AM  Result Value Ref Range   pH, Arterial 7.160 (LL) 7.35 - 7.45   pCO2 arterial  50.6 (H) 32 - 48 mmHg   pO2, Arterial 88 83 - 108 mmHg   Bicarbonate 18.1 (L) 20.0 - 28.0 mmol/L   TCO2 20 (L) 22 - 32 mmol/L   O2 Saturation 94 %   Acid-base deficit 11.0 (H) 0.0 - 2.0 mmol/L   Sodium 136 135 - 145 mmol/L   Potassium 4.5 3.5 - 5.1 mmol/L   Calcium, Ion 1.27 1.15 - 1.40 mmol/L   HCT 41.0 36.0 - 46.0 %   Hemoglobin 13.9 12.0 - 15.0 g/dL   Patient temperature 98.1 F    Collection site RADIAL, ALLEN'S TEST ACCEPTABLE    Drawn by RT    Sample type ARTERIAL    Comment NOTIFIED PHYSICIAN   Troponin I (High Sensitivity)  Status: Abnormal   Collection Time: 01/01/22 10:37 AM  Result Value Ref Range   Troponin I (High Sensitivity) 63 (H) <18 ng/L    Comment: RESULT CALLED TO, READ BACK BY AND VERIFIED WITH P,PULLIAM RN '@1142'$  01/01/22 E,[place] (NOTE) Elevated high sensitivity troponin I (hsTnI) values and significant  changes across serial measurements may suggest ACS but many other  chronic and acute conditions are known to elevate hsTnI results.  Refer to the "Links" section for chest pain algorithms and additional  guidance. Performed at [facility], [place] [address] [place], [place] [address]   Procalcitonin     Status: None   Collection Time: 01/01/22 10:37 AM  Result Value Ref Range   Procalcitonin 0.47 ng/mL    Comment:        Interpretation: PCT (Procalcitonin) <= 0.5 ng/mL: Systemic infection (sepsis) is not likely. Local bacterial infection is possible. (NOTE)       Sepsis PCT Algorithm           Lower Respiratory Tract                                      Infection PCT Algorithm    ----------------------------     ----------------------------         PCT < 0.25 ng/mL                PCT < 0.10 ng/mL          Strongly encourage             Strongly discourage   discontinuation of antibiotics    initiation of antibiotics    ----------------------------     -----------------------------       PCT 0.25 - 0.50 ng/mL            PCT 0.10 - 0.25  ng/mL               OR       >80% decrease in PCT            Discourage initiation of                                            antibiotics      Encourage discontinuation           of antibiotics    ----------------------------     -----------------------------         PCT >= 0.50 ng/mL              PCT 0.26 - 0.50 ng/mL               AND        <80% decrease in PCT             Encourage initiation of                                             antibiotics       Encourage continuation           of antibiotics    ----------------------------     -----------------------------        PCT >= 0.50 ng/mL  PCT > 0.50 ng/mL               AND         increase in PCT                  Strongly encourage                                      initiation of antibiotics    Strongly encourage escalation           of antibiotics                                     -----------------------------                                           PCT <= 0.25 ng/mL                                                 OR                                        > 80% decrease in PCT                                      Discontinue / Do not initiate                                             antibiotics  Performed at [facility], [address], [place], [place] [address]    *Note: Due to a large number of results and/or encounters for the requested time period, some results have not been displayed. A complete set of results can be found in Results Review.   DG Chest Port 1 View  Result Date: 01/01/2022 CLINICAL DATA:  Shortness of breath. EXAM: PORTABLE CHEST 1 VIEW COMPARISON:  09/12/2021 FINDINGS: 0456 hours. Cardiopericardial silhouette is at upper limits of normal for size. Permanent pacemaker/AICD noted. Diffuse interstitial opacity is progressive in the interval suggesting edema. Increased prominence of the right hilum in taking into account the diffuse interstitial opacity seen on the  current study. No substantial pleural effusion. Curvilinear density in the parahilar right lung is new in the interval and indeterminate, potentially external to the patient. External to the patient. Bones are diffusely demineralized. Telemetry leads overlie the chest. IMPRESSION: Interval progression of diffuse interstitial opacity suggesting pulmonary edema. Fullness in the right hilum and infrahilar region is mildly progressive in the interval, potentially related to vascular congestion and superimposed edema. Follow-up dedicated PA and lateral chest x-ray recommended to further evaluate when the patient is able. Electronically Signed   By: Misty Stanley M.D.   On: 01/01/2022 05:06    Pending Labs Unresulted Labs (From admission, onward)  Start     Ordered   01/02/22 0931  Basic metabolic panel  Tomorrow morning,   R        01/01/22 0833   01/02/22 0500  CBC  Tomorrow morning,   R        01/01/22 0833   01/01/22 0929  Hemoglobin A1c  Add-on,   AD        01/01/22 0928   01/01/22 0914  Urinalysis, Routine w reflex microscopic  Once,   R        01/01/22 0913   01/01/22 0912  Culture, blood (Routine X 2) w Reflex to ID Panel  BLOOD CULTURE X 2,   R (with TIMED occurrences)      01/01/22 0911   01/01/22 0912  Lactic acid, plasma  Add-on,   AD        01/01/22 0911            Vitals/Pain Today's Vitals   01/01/22 1300 01/01/22 1400 01/01/22 1534 01/01/22 1600  BP: 100/62 103/65  99/60  Pulse: 80 82  82  Resp: '20 20  19  '$ Temp:   (!) 97.4 F (36.3 C)   TempSrc:   Temporal   SpO2: 99% 99%  99%  Weight:      Height:        Isolation Precautions No active isolations  Medications Medications  enoxaparin (LOVENOX) injection 40 mg (40 mg Subcutaneous Given 01/01/22 1047)  sodium chloride flush (NS) 0.9 % injection 3 mL (3 mLs Intravenous Not Given 01/01/22 1106)  acetaminophen (TYLENOL) tablet 650 mg (has no administration in time range)    Or  acetaminophen (TYLENOL) suppository  650 mg (has no administration in time range)  cefTRIAXone (ROCEPHIN) 2 g in sodium chloride 0.9 % 100 mL IVPB (0 g Intravenous Stopped 01/01/22 1127)  LORazepam (ATIVAN) injection 0.25 mg (has no administration in time range)  hydrocortisone (ANUSOL-HC) suppository 25 mg (has no administration in time range)  aspirin EC tablet 81 mg (has no administration in time range)  carvedilol (COREG) tablet 3.125 mg (has no administration in time range)  magnesium oxide (MAG-OX) tablet 400 mg (has no administration in time range)  beclomethasone (QVAR) 80 MCG/ACT inhaler 2 puff (has no administration in time range)  levalbuterol (XOPENEX) nebulizer solution 0.63 mg (has no administration in time range)  Menthol (Topical Analgesic) 4 % GEL (has no administration in time range)  miconazole (MICOTIN) 2 % powder 1 Application (has no administration in time range)  nystatin cream (MYCOSTATIN) 1 Application (has no administration in time range)  bumetanide (BUMEX) injection 0.5 mg (has no administration in time range)    Mobility walks Low fall risk   Focused Assessments Pulmonary Assessment Handoff:  Lung sounds: Bilateral Breath Sounds: Fine crackles, Coarse crackles, Rhonchi L Breath Sounds: Diminished R Breath Sounds: Diminished O2 Device: Bi-PAP      R Recommendations: See Admitting Provider Note  Report given to:   Additional Notes:

## 2022-01-01 NOTE — Significant Event (Addendum)
Pt is very expressive of needs guarded with care. Has 28 allergies including latex and adhesives. Needs paper tape and special electrodes from ED.  Use own Personal hygiene products

## 2022-01-01 NOTE — ED Notes (Signed)
The pt reports she cannot take  lasix  demadex substituted but it is a pill and she is on bi-pap  cannot come off that just now  the pt is becoming more relaxed

## 2022-01-02 ENCOUNTER — Other Ambulatory Visit: Payer: Self-pay | Admitting: Physician Assistant

## 2022-01-02 DIAGNOSIS — J9602 Acute respiratory failure with hypercapnia: Secondary | ICD-10-CM | POA: Diagnosis not present

## 2022-01-02 DIAGNOSIS — J9601 Acute respiratory failure with hypoxia: Secondary | ICD-10-CM | POA: Diagnosis not present

## 2022-01-02 LAB — CBC
HCT: 36.2 % (ref 36.0–46.0)
Hemoglobin: 11.4 g/dL — ABNORMAL LOW (ref 12.0–15.0)
MCH: 30.1 pg (ref 26.0–34.0)
MCHC: 31.5 g/dL (ref 30.0–36.0)
MCV: 95.5 fL (ref 80.0–100.0)
Platelets: 159 10*3/uL (ref 150–400)
RBC: 3.79 MIL/uL — ABNORMAL LOW (ref 3.87–5.11)
RDW: 13.2 % (ref 11.5–15.5)
WBC: 10 10*3/uL (ref 4.0–10.5)
nRBC: 0 % (ref 0.0–0.2)

## 2022-01-02 LAB — BASIC METABOLIC PANEL
Anion gap: 9 (ref 5–15)
BUN: 18 mg/dL (ref 8–23)
CO2: 24 mmol/L (ref 22–32)
Calcium: 9.4 mg/dL (ref 8.9–10.3)
Chloride: 108 mmol/L (ref 98–111)
Creatinine, Ser: 0.95 mg/dL (ref 0.44–1.00)
GFR, Estimated: >60 mL/min (ref >60)
Glucose, Bld: 100 mg/dL — ABNORMAL HIGH (ref 70–99)
Potassium: 3.8 mmol/L (ref 3.5–5.1)
Sodium: 141 mmol/L (ref 135–145)

## 2022-01-02 MED ORDER — BUMETANIDE 0.25 MG/ML IJ SOLN
0.5000 mg | Freq: Two times a day (BID) | INTRAMUSCULAR | Status: DC
  Administered 2022-01-02 – 2022-01-04 (×5): 0.5 mg via INTRAVENOUS
  Filled 2022-01-02 (×7): qty 2

## 2022-01-02 MED ORDER — ORAL CARE MOUTH RINSE: 15.0000 mL | OROMUCOSAL | Status: DC | PRN

## 2022-01-02 MED ORDER — POLYETHYLENE GLYCOL 3350 17 G PO PACK: 17.0000 g | PACK | Freq: Every day | ORAL | Status: DC | PRN

## 2022-01-02 MED ORDER — SENNA 8.6 MG PO TABS
1.0000 | ORAL_TABLET | Freq: Two times a day (BID) | ORAL | Status: DC
  Administered 2022-01-04: 8.6 mg via ORAL
  Filled 2022-01-02 (×10): qty 1

## 2022-01-02 NOTE — Progress Notes (Signed)
Heart Failure Navigator Progress Note  Assessed for Heart & Vascular TOC clinic readiness.  Patient does not meet criteria due to Advanced Heart Failure Team .     Earnestine Leys, BSN, RN Heart Failure Nurse Navigator Secure Chat Only

## 2022-01-02 NOTE — Progress Notes (Addendum)
PROGRESS NOTE    Ashley [name]  HLK:[id number] DOB: 1945/12/26 DOA: 01/01/2022 PCP: [name], MD  Ashley [name] is a 76 y.o. 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  Take torsemide 10 Mg 3 days a week -In the ED was in respiratory distress, placed on BiPAP, labs noted WBC of 19, chest x-ray with diffuse interstitial opacity concerning for pulmonary edema  Subjective: -Feels better, breathing is improving  Assessment and Plan:  Acute on chronic respiratory failure with hypercapnia and hypoxia Acute on chronic systolic and diastolic CHF exacerbation S/p AICD NICM, echo 5/23 w/ EF 25-30% with indeterminate diastolic parameters, mod MR -She is intolerant to Lasix and Farxiga -Continue IV Bumex today, cardiology following -Continue carvedilol, restart Aldactone when BP tolerates -GDMT as BP tolerates  SIRS Leukocytosis -?  Pneumonia, cannot be ruled out with leukocytosis and elevated procalcitonin -Continue ceftriaxone X 5 days   Hyperkalemia -Resolved   Asthma,  -Stable, pharmacy substitution of budesonide for Qvar -Levalbuterol PRN   Essential hypertension -Soft, see meds as above   Constipation -Chronic, add senna   AKI -resolved, monitor   Hyperglycemia Acute.  FU hemoglobin A1c   Anxiety Home medication includes Ativan.   Hyperlipidemia -Resume Zetia once able   Obesity BMI 30.85 kg/m  DVT proph: lovenox    Advance Care Planning:   Code Status: Full Code    Consults: Cardiology Family Communication: Discussed with patient detail, no family at bedside Disposition Plan: Home pending improvement in volume status  Consultants:  Cardiology   Procedures:   Antimicrobials:    Objective: Vitals:   01/02/22 0227 01/02/22 0300 01/02/22 0400 01/02/22 0422  BP:    (!) 94/51  Pulse: 85 82 81 90  Resp: (!) '24 13 16 18  '$ Temp:    97.9 F (36.6 C)  TempSrc:    Oral  SpO2: 98% 98% 98% 97%  Weight:    82.1 kg  Height:        Intake/Output Summary (Last 24 hours) at 01/02/2022 0943 Last data filed at 01/02/2022 0300 Gross per 24 hour  Intake 100.55 ml  Output 700 ml  Net -599.45 ml   Filed Weights   01/01/22 0540 01/02/22 0422  Weight: 84.1 kg 82.1 kg    Examination:  General exam: Chronically ill elderly female sitting up in bed, AAOx3, no distress HEENT: Positive JVD CVS: S1-S2, regular rhythm Lungs: Fine bilateral rales Abdomen: Soft, nontender, bowel sounds present Extremities: No edema Skin: No rashes Psychiatry:  Mood & affect appropriate.     Data Reviewed:   CBC: Recent Labs  Lab 01/01/22 0453 01/01/22 0502 01/02/22 0725  WBC 19.3*  --  10.0  NEUTROABS 12.9*  --   --   HGB 14.4 13.9 11.4*  HCT 46.2* 41.0 36.2  MCV 97.3  --  95.5  PLT 273  --  876   Basic Metabolic Panel: Recent Labs  Lab 01/01/22 0453 01/01/22 0502 01/02/22 0725  NA 136 136 141  K 5.5* 4.5 3.8  CL 107  --  108  CO2 11*  --  24  GLUCOSE 249*  --  100*  BUN 22  --  18  CREATININE 1.21*  --  0.95  CALCIUM 10.0  --  9.4   GFR: Estimated Creatinine Clearance: 53.3 mL/min (by C-G formula based on SCr of 0.95 mg/dL). Liver Function Tests: No results for input(s): "AST", "ALT", "ALKPHOS", "BILITOT", "PROT", "ALBUMIN" in the last 168 hours. No results for input(s): "LIPASE", "AMYLASE" in the last 168 hours. No results for input(s): "AMMONIA" in the last 168 hours. Coagulation Profile: No results for input(s): "INR", "PROTIME" in the last 168 hours. Cardiac Enzymes: No results for input(s): "CKTOTAL", "CKMB", "CKMBINDEX", "TROPONINI" in the last 168 hours. BNP (last 3 results) Recent Labs     02/07/21 1409  PROBNP 21.0   HbA1C: No results for input(s): "HGBA1C" in the last 72 hours. CBG: No results for input(s): "GLUCAP" in the last 168 hours. Lipid Profile: No results for input(s): "CHOL", "HDL", "LDLCALC", "TRIG", "CHOLHDL", "LDLDIRECT" in the last 72 hours. Thyroid Function Tests: No results for input(s): "TSH", "T4TOTAL", "FREET4", "T3FREE", "THYROIDAB" in the last 72 hours. Anemia Panel: No results for input(s): "VITAMINB12", "FOLATE", "FERRITIN", "TIBC", "IRON", "RETICCTPCT" in the last 72 hours. Urine analysis:    Component Value Date/Time   COLORURINE YELLOW 06/30/2021 1433   APPEARANCEUR CLEAR 06/30/2021 1433   LABSPEC 1.021 06/30/2021 1433   PHURINE 5.0 06/30/2021 1433   GLUCOSEU NEGATIVE 06/30/2021 1433   HGBUR NEGATIVE 06/30/2021 1433   BILIRUBINUR NEGATIVE 06/30/2021 1433   BILIRUBINUR negative 10/26/2020 1646   KETONESUR NEGATIVE 06/30/2021 1433   PROTEINUR NEGATIVE 06/30/2021 1433   UROBILINOGEN 0.2 10/26/2020 1646   UROBILINOGEN 0.2 05/27/2014 1952   NITRITE NEGATIVE 06/30/2021 1433   LEUKOCYTESUR TRACE (A) 06/30/2021 1433   Sepsis Labs: '@LABRCNTIP'$ (procalcitonin:4,lacticidven:4)  ) Recent Results (from the past 240 hour(s))  Culture, blood (Routine X 2) w Reflex to ID Panel     Status: None (Preliminary result)   Collection Time: 01/01/22 10:38 AM   Specimen: BLOOD RIGHT ARM  Result Value Ref Range Status   Specimen Description BLOOD RIGHT ARM  Final   Special Requests   Final    BOTTLES DRAWN AEROBIC AND ANAEROBIC Blood Culture adequate volume   Culture   Final    NO GROWTH < 24 HOURS Performed at [facility], [place] [address], [place], [place] [address]    Report Status PENDING  Incomplete     Radiology Studies: DG Chest Port 1 View  Result Date: 01/01/2022 CLINICAL DATA:  Shortness of breath. EXAM: PORTABLE CHEST 1 VIEW COMPARISON:  09/12/2021 FINDINGS: 0456 hours. Cardiopericardial silhouette is at upper limits of normal for  size. Permanent pacemaker/AICD noted. Diffuse interstitial opacity is progressive in the interval suggesting edema. Increased prominence of the right hilum in taking into account the diffuse interstitial opacity seen on the current study. No substantial pleural effusion. Curvilinear density in the parahilar right lung is new in the interval and indeterminate, potentially external to the patient. External to the patient. Bones are diffusely demineralized. Telemetry leads overlie the chest. IMPRESSION: Interval progression of diffuse interstitial opacity suggesting pulmonary edema. Fullness in the right hilum and infrahilar region is mildly progressive in the interval, potentially related to vascular congestion and superimposed edema. Follow-up dedicated PA and lateral chest x-ray recommended to further evaluate when the patient is able. Electronically Signed   By: Misty Stanley M.D.   On: 01/01/2022 05:06     Scheduled Meds:  aspirin EC  81 mg Oral QHS   budesonide  0.5 mg Nebulization BID   bumetanide (BUMEX) IV  0.5 mg Intravenous Q12H   carvedilol  3.125 mg Oral BID WC   enoxaparin (  LOVENOX) injection  40 mg Subcutaneous Q24H   magnesium oxide  400 mg Oral Daily   sodium chloride flush  3 mL Intravenous Q12H   Continuous Infusions:  cefTRIAXone (ROCEPHIN)  IV 2 g (01/02/22 0841)     LOS: 1 day    Time spent: 65mn  PDomenic Polite MD [facility]   01/02/2022, 9:43 AM

## 2022-01-02 NOTE — Consult Note (Signed)
[facility] Inpatient Consult   01/02/2022  Ashley [name] 11-Aug-1945 [id number]   [facility] [[facility]]  Accountable Care Organization [ACO] Patient: Medicare ACO REACH   Primary Care Provider: [name], MD with [facility] at [facility], is an Independent Embedded provider with a Care Management team and program and is listed for the Transition of Care [TOC] follow up needs    Patient screened for potential [facility]  [[facility]] Care Management service needs for post hospital transition for readmission prevention needs.   Patient rounds was resting. Will follow up. Chart reviewed for ongoing medical needs. Plan:  Continue to follow progress and disposition to assess for post hospital care management needs.    Addendum *01/03/22 1500 Met with patient up in recliner and husband at the bedside to explaine reason for visit and for post hospital needs.  Patient states she has follow up with the Embedded pharmacist and explained other services available with Care Coordination/Care Management. Patient verbalized understanding and for TOC follow up.   For questions contact:    Charlesetta Shanks, RN BSN CCM [facility]  [phone] business mobile phone Toll free office [phone]  Fax number: [phone] [name]

## 2022-01-02 NOTE — Progress Notes (Signed)
Pt. Refused bipap for tonight. Pt. Understands to notify if she feels she needs it or if she changes her mind. RN aware.

## 2022-01-02 NOTE — Progress Notes (Signed)
Progress Note  Patient Name: Ashley [name] Date of Encounter: 01/02/2022  Primary Cardiologist: [name], MD   Subjective   Overnight had asymptomatic AF. Patient notes that she has a history; she is not sure why she is no on [facility] but thinks this may be related to her allergies No CP, no palpitations; SOB has improved  Inpatient Medications    Scheduled Meds:  aspirin EC  81 mg Oral QHS   budesonide  0.5 mg Nebulization BID   carvedilol  3.125 mg Oral BID WC   enoxaparin (LOVENOX) injection  40 mg Subcutaneous Q24H   magnesium oxide  400 mg Oral Daily   sodium chloride flush  3 mL Intravenous Q12H   Continuous Infusions:  cefTRIAXone (ROCEPHIN)  IV Stopped (01/01/22 1127)   PRN Meds: acetaminophen **OR** acetaminophen, hydrocortisone, levalbuterol, LORazepam, miconazole, Muscle Rub, nystatin cream   Vital Signs    Vitals:   01/02/22 0227 01/02/22 0300 01/02/22 0400 01/02/22 0422  BP:    (!) 94/51  Pulse: 85 82 81 90  Resp: (!) '24 13 16 18  '$ Temp:    97.9 F (36.6 C)  TempSrc:    Oral  SpO2: 98% 98% 98% 97%  Weight:    82.1 kg  Height:        Intake/Output Summary (Last 24 hours) at 01/02/2022 0716 Last data filed at 01/02/2022 0300 Gross per 24 hour  Intake 100.55 ml  Output 700 ml  Net -599.45 ml   Filed Weights   01/01/22 0540 01/02/22 0422  Weight: 84.1 kg 82.1 kg    Telemetry    V Paced; AM episode of AF RVR - Personally Reviewed  Physical Exam   Gen: No distress  Neck: No JVD,  Cardiac: No Rubs or Gallops, no Murmur, RRR +2 radial pulses Respiratory: Crackles in bases, normal effort, normal rate GI: Soft, nontender, non-distended  MS: No  edema;  moves all extremities Integument: Skin feels warm Neuro:  At time of evaluation, alert and oriented to person/place/time/situation  Psych: Normal affect, patient feels better   Labs    Chemistry Recent Labs  Lab 01/01/22 0453 01/01/22 0502  NA 136 136  K 5.5* 4.5  CL 107  --    CO2 11*  --   GLUCOSE 249*  --   BUN 22  --   CREATININE 1.21*  --   CALCIUM 10.0  --   GFRNONAA 46*  --   ANIONGAP 18*  --      Hematology Recent Labs  Lab 01/01/22 0453 01/01/22 0502  WBC 19.3*  --   RBC 4.75  --   HGB 14.4 13.9  HCT 46.2* 41.0  MCV 97.3  --   MCH 30.3  --   MCHC 31.2  --   RDW 13.2  --   PLT 273  --     Cardiac EnzymesNo results for input(s): "TROPONINI" in the last 168 hours. No results for input(s): "TROPIPOC" in the last 168 hours.   BNP Recent Labs  Lab 01/01/22 0453  BNP 2,040.8*     DDimer No results for input(s): "DDIMER" in the last 168 hours.   Radiology    DG Chest Port 1 View  Result Date: 01/01/2022 CLINICAL DATA:  Shortness of breath. EXAM: PORTABLE CHEST 1 VIEW COMPARISON:  09/12/2021 FINDINGS: 0456 hours. Cardiopericardial silhouette is at upper limits of normal for size. Permanent pacemaker/AICD noted. Diffuse interstitial opacity is progressive in the interval suggesting edema. Increased prominence of  the right hilum in taking into account the diffuse interstitial opacity seen on the current study. No substantial pleural effusion. Curvilinear density in the parahilar right lung is new in the interval and indeterminate, potentially external to the patient. External to the patient. Bones are diffusely demineralized. Telemetry leads overlie the chest. IMPRESSION: Interval progression of diffuse interstitial opacity suggesting pulmonary edema. Fullness in the right hilum and infrahilar region is mildly progressive in the interval, potentially related to vascular congestion and superimposed edema. Follow-up dedicated PA and lateral chest x-ray recommended to further evaluate when the patient is able. Electronically Signed   By: Misty Stanley M.D.   On: 01/01/2022 05:06     Patient Profile     76 y.o. 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  - Coreg 3.125 mg PO BID; unable to titrate due to BP; given her 28 allergies I am worried switching to succinate or bisoprolol would not be well tolerated - home entresto dose held for BP - SGLT2i allergy - will return half dose aldactone, full dose tomorrow if ok  PAF - CHADSVASC 4 -- she notes this history I am unclear why she is not on [facility] but after long discussion with her will not start this admission (she is concerned about allergies) - will get device interrogation for Optivl and AT/AF burden   For questions or updates, please contact [facility] Please consult [email or web address] for contact info under Cardiology/STEMI.      Rudean Haskell, MD [place], [address] [place], [place] [address] [phone]  7:16 AM

## 2022-01-03 ENCOUNTER — Ambulatory Visit: Payer: Medicare Other | Admitting: Cardiovascular Disease

## 2022-01-03 DIAGNOSIS — J9602 Acute respiratory failure with hypercapnia: Secondary | ICD-10-CM | POA: Diagnosis not present

## 2022-01-03 DIAGNOSIS — J9601 Acute respiratory failure with hypoxia: Secondary | ICD-10-CM | POA: Diagnosis not present

## 2022-01-03 LAB — BASIC METABOLIC PANEL
Anion gap: 11 (ref 5–15)
BUN: 22 mg/dL (ref 8–23)
CO2: 23 mmol/L (ref 22–32)
Calcium: 9.8 mg/dL (ref 8.9–10.3)
Chloride: 105 mmol/L (ref 98–111)
Creatinine, Ser: 1.09 mg/dL — ABNORMAL HIGH (ref 0.44–1.00)
GFR, Estimated: 53 mL/min — ABNORMAL LOW (ref >60)
Glucose, Bld: 99 mg/dL (ref 70–99)
Potassium: 4.1 mmol/L (ref 3.5–5.1)
Sodium: 139 mmol/L (ref 135–145)

## 2022-01-03 LAB — CBC
HCT: 34.5 % — ABNORMAL LOW (ref 36.0–46.0)
Hemoglobin: 11.4 g/dL — ABNORMAL LOW (ref 12.0–15.0)
MCH: 30.1 pg (ref 26.0–34.0)
MCHC: 33 g/dL (ref 30.0–36.0)
MCV: 91 fL (ref 80.0–100.0)
Platelets: 193 10*3/uL (ref 150–400)
RBC: 3.79 MIL/uL — ABNORMAL LOW (ref 3.87–5.11)
RDW: 13 % (ref 11.5–15.5)
WBC: 11.2 10*3/uL — ABNORMAL HIGH (ref 4.0–10.5)
nRBC: 0 % (ref 0.0–0.2)

## 2022-01-03 MED ORDER — MAGNESIUM SULFATE IN D5W 1-5 GM/100ML-% IV SOLN
1.0000 g | Freq: Once | INTRAVENOUS | Status: DC
  Filled 2022-01-03: qty 100

## 2022-01-03 NOTE — Progress Notes (Addendum)
PROGRESS NOTE    Ashley [name]  TMH:[id number] DOB: March 11, 1946 DOA: 01/01/2022 PCP: [name], MD  Ashley [name] is a 76 y.o. female with medical history significant of combined systolic and diastolic CHF last EF 92-11%, NICM  s/p AICD/pacemaker, HTN, asthma, sleep apnea, and history of perforated diverticulitis with abscess and fistula s/p partial resection of colon who presented with complaints of progressively worsening shortness of breath, orthopnea cough and wheezing.  Take torsemide 10 Mg 3 days a week -In the ED was in respiratory distress, placed on BiPAP, labs noted WBC of 19, chest x-ray with diffuse interstitial opacity concerning for pulmonary edema -Lasix intolerance/allergy reported  Subjective: -Feels better, breathing is improving, yesterday went into A-fib  Assessment and Plan:  Acute on chronic respiratory failure with hypercapnia and hypoxia Acute on chronic systolic and diastolic CHF exacerbation S/p AICD NICM, echo 5/23 w/ EF 25-30% with indeterminate diastolic parameters, mod MR -She is intolerant to Lasix and Farxiga, 28 different allergies -Continue IV Bumex today, cardiology following, urine output likely inaccurate, weight down 6lb -Continue carvedilol, restart Aldactone at 12.5 Mg -GDMT as BP tolerates -Attempt to wean off O2, uses this PRN at home  SIRS Leukocytosis -?  Pneumonia, cannot be ruled out with leukocytosis and elevated procalcitonin -Continue ceftriaxone X 5 days  Paroxysmal A-fib -Noted on telemetry, new diagnosis, cards following and recommended anticoagulation, patient is reluctant to consider this due to multiple list of allergies and intolerances to medications   Hyperkalemia -Resolved   Asthma,  -Stable, pharmacy substitution of budesonide for Qvar -Levalbuterol PRN   Essential hypertension -Soft, see meds as above   Constipation -Chronic, senna added   AKI -resolved, monitor  Anxiety Home medication  includes Ativan.   Hyperlipidemia -Resume Zetia once able   Obesity BMI 30.85 kg/m  DVT proph: lovenox    Advance Care Planning:   Code Status: Full Code    Consults: Cardiology Family Communication: Discussed with patient detail, no family at bedside Disposition Plan: Home pending improvement in volume status likely Thursday  Consultants: Cardiology   Procedures:   Antimicrobials:    Objective: Vitals:   01/02/22 2349 01/03/22 0417 01/03/22 0420 01/03/22 1009  BP: (!) 99/55  105/66 (!) 96/53  Pulse: 84  94   Resp: 17  18   Temp: 98.4 F (36.9 C)  98 F (36.7 C)   TempSrc: Oral  Oral   SpO2: 97%  96%   Weight:  81.1 kg    Height:        Intake/Output Summary (Last 24 hours) at 01/03/2022 1225 Last data filed at 01/03/2022 0421 Gross per 24 hour  Intake 240 ml  Output 600 ml  Net -360 ml   Filed Weights   01/01/22 0540 01/02/22 0422 01/03/22 0417  Weight: 84.1 kg 82.1 kg 81.1 kg    Examination:  General exam: Chronically ill elderly female sitting up in bed, AAOx3, no distress HEENT: Positive JVD CVS: S1-S2, regular rhythm Lungs: Fine bilateral rales Abdomen: Soft, nontender, bowel sounds present Extremities: No edema Skin: No rashes Psychiatry:  Mood & affect appropriate.     Data Reviewed:   CBC: Recent Labs  Lab 01/01/22 0453 01/01/22 0502 01/02/22 0725 01/03/22 0428  WBC 19.3*  --  10.0 11.2*  NEUTROABS 12.9*  --   --   --   HGB 14.4 13.9 11.4* 11.4*  HCT 46.2* 41.0 36.2 34.5*  MCV 97.3  --  95.5 91.0  PLT 273  --  159  546   Basic Metabolic Panel: Recent Labs  Lab 01/01/22 0453 01/01/22 0502 01/02/22 0725 01/03/22 0428  NA 136 136 141 139  K 5.5* 4.5 3.8 4.1  CL 107  --  108 105  CO2 11*  --  24 23  GLUCOSE 249*  --  100* 99  BUN 22  --  18 22  CREATININE 1.21*  --  0.95 1.09*  CALCIUM 10.0  --  9.4 9.8   GFR: Estimated Creatinine Clearance: 46.2 mL/min (A) (by C-G formula based on SCr of 1.09 mg/dL (H)). Liver  Function Tests: No results for input(s): "AST", "ALT", "ALKPHOS", "BILITOT", "PROT", "ALBUMIN" in the last 168 hours. No results for input(s): "LIPASE", "AMYLASE" in the last 168 hours. No results for input(s): "AMMONIA" in the last 168 hours. Coagulation Profile: No results for input(s): "INR", "PROTIME" in the last 168 hours. Cardiac Enzymes: No results for input(s): "CKTOTAL", "CKMB", "CKMBINDEX", "TROPONINI" in the last 168 hours. BNP (last 3 results) Recent Labs    02/07/21 1409  PROBNP 21.0   HbA1C: No results for input(s): "HGBA1C" in the last 72 hours. CBG: No results for input(s): "GLUCAP" in the last 168 hours. Lipid Profile: No results for input(s): "CHOL", "HDL", "LDLCALC", "TRIG", "CHOLHDL", "LDLDIRECT" in the last 72 hours. Thyroid Function Tests: No results for input(s): "TSH", "T4TOTAL", "FREET4", "T3FREE", "THYROIDAB" in the last 72 hours. Anemia Panel: No results for input(s): "VITAMINB12", "FOLATE", "FERRITIN", "TIBC", "IRON", "RETICCTPCT" in the last 72 hours. Urine analysis:    Component Value Date/Time   COLORURINE YELLOW 06/30/2021 1433   APPEARANCEUR CLEAR 06/30/2021 1433   LABSPEC 1.021 06/30/2021 1433   PHURINE 5.0 06/30/2021 1433   GLUCOSEU NEGATIVE 06/30/2021 1433   HGBUR NEGATIVE 06/30/2021 1433   BILIRUBINUR NEGATIVE 06/30/2021 1433   BILIRUBINUR negative 10/26/2020 1646   KETONESUR NEGATIVE 06/30/2021 1433   PROTEINUR NEGATIVE 06/30/2021 1433   UROBILINOGEN 0.2 10/26/2020 1646   UROBILINOGEN 0.2 05/27/2014 1952   NITRITE NEGATIVE 06/30/2021 1433   LEUKOCYTESUR TRACE (A) 06/30/2021 1433   Sepsis Labs: '@LABRCNTIP'$ (procalcitonin:4,lacticidven:4)  ) Recent Results (from the past 240 hour(s))  Culture, blood (Routine X 2) w Reflex to ID Panel     Status: None (Preliminary result)   Collection Time: 01/01/22 10:38 AM   Specimen: BLOOD RIGHT ARM  Result Value Ref Range Status   Specimen Description BLOOD RIGHT ARM  Final   Special Requests    Final    BOTTLES DRAWN AEROBIC AND ANAEROBIC Blood Culture adequate volume   Culture   Final    NO GROWTH 2 DAYS Performed at [facility], [place] [address] [place],  [address]    Report Status PENDING  Incomplete     Radiology Studies: No results found.   Scheduled Meds:  aspirin EC  81 mg Oral QHS   budesonide  0.5 mg Nebulization BID   bumetanide (BUMEX) IV  0.5 mg Intravenous Q12H   carvedilol  3.125 mg Oral BID WC   enoxaparin (LOVENOX) injection  40 mg Subcutaneous Q24H   magnesium oxide  400 mg Oral Daily   senna  1 tablet Oral BID   sodium chloride flush  3 mL Intravenous Q12H   Continuous Infusions:  cefTRIAXone (ROCEPHIN)  IV 2 g (01/03/22 1017)     LOS: 2 days    Time spent: 10mn  PDomenic Polite MD [facility]   01/03/2022, 12:25 PM

## 2022-01-03 NOTE — Progress Notes (Signed)
   01/03/22 1400  Mobility  Activity Ambulated independently in hallway  Level of Assistance Independent  Distance Ambulated (ft) 520 ft  Activity Response Tolerated well  $Mobility charge 1 Mobility   Mobility Specialist Progress Note  Received pt EOB having no complaints and agreeable to mobility. Pt was asymptomatic throughout ambulation and returned to room w/o fault. Left EOB w/ call bell in reach and all needs met.   Ashley [name] Mobility Specialist

## 2022-01-03 NOTE — Progress Notes (Signed)
Progress Note  Patient Name: Ashley [name] Date of Encounter: 01/03/2022  Primary Cardiologist: [name], MD   Subjective   Overnight device interrogation did not show prior AT/AF; 01/02/22 appears to be her first event. Optivol normal. Patient notes that she has AF back in 2013. She is symptomatic when it occurs. Able to get back to the restroom without light headedness. She feels improvement from SOB  Inpatient Medications    Scheduled Meds:  aspirin EC  81 mg Oral QHS   budesonide  0.5 mg Nebulization BID   bumetanide (BUMEX) IV  0.5 mg Intravenous Q12H   carvedilol  3.125 mg Oral BID WC   enoxaparin (LOVENOX) injection  40 mg Subcutaneous Q24H   magnesium oxide  400 mg Oral Daily   senna  1 tablet Oral BID   sodium chloride flush  3 mL Intravenous Q12H   Continuous Infusions:  cefTRIAXone (ROCEPHIN)  IV 2 g (01/02/22 0841)   PRN Meds: acetaminophen **OR** acetaminophen, hydrocortisone, levalbuterol, LORazepam, miconazole, Muscle Rub, nystatin cream, mouth rinse, polyethylene glycol   Vital Signs    Vitals:   01/02/22 2222 01/02/22 2349 01/03/22 0417 01/03/22 0420  BP: (!) 96/53 (!) 99/55  105/66  Pulse: 90 84  94  Resp: '20 17  18  '$ Temp:  98.4 F (36.9 C)  98 F (36.7 C)  TempSrc:  Oral  Oral  SpO2: 96% 97%  96%  Weight:   81.1 kg   Height:        Intake/Output Summary (Last 24 hours) at 01/03/2022 0847 Last data filed at 01/03/2022 0421 Gross per 24 hour  Intake 240 ml  Output 600 ml  Net -360 ml   Filed Weights   01/01/22 0540 01/02/22 0422 01/03/22 0417  Weight: 84.1 kg 82.1 kg 81.1 kg    Telemetry    His Pacing- Personally Reviewed  Physical Exam   Gen: No distress  Neck: No JVD,  Cardiac: No Rubs or Gallops, no Murmur, RRR +2 radial pulses Respiratory: Crackles in bases, improved from prior, normal effort, normal rate GI: Soft, nontender, non-distended  MS: No  edema;  moves all extremities Integument: Skin feels  warm Neuro:  At time of evaluation, alert and oriented to person/place/time/situation  Psych: Normal affect, patient feels better  O2 sat 94-95% on RA   Labs    Chemistry Recent Labs  Lab 01/01/22 0453 01/01/22 0502 01/02/22 0725 01/03/22 0428  NA 136 136 141 139  K 5.5* 4.5 3.8 4.1  CL 107  --  108 105  CO2 11*  --  24 23  GLUCOSE 249*  --  100* 99  BUN 22  --  18 22  CREATININE 1.21*  --  0.95 1.09*  CALCIUM 10.0  --  9.4 9.8  GFRNONAA 46*  --  >60 53*  ANIONGAP 18*  --  9 11     Hematology Recent Labs  Lab 01/01/22 0453 01/01/22 0502 01/02/22 0725 01/03/22 0428  WBC 19.3*  --  10.0 11.2*  RBC 4.75  --  3.79* 3.79*  HGB 14.4 13.9 11.4* 11.4*  HCT 46.2* 41.0 36.2 34.5*  MCV 97.3  --  95.5 91.0  MCH 30.3  --  30.1 30.1  MCHC 31.2  --  31.5 33.0  RDW 13.2  --  13.2 13.0  PLT 273  --  159 193    Cardiac EnzymesNo results for input(s): "TROPONINI" in the last 168 hours. No results for input(s): "TROPIPOC" in  the last 168 hours.   BNP Recent Labs  Lab 01/01/22 0453  BNP 2,040.8*     DDimer No results for input(s): "DDIMER" in the last 168 hours.   Radiology    No results found.  Patient Profile     76 y.o. female NICM with significant functional MR, s/p iLBBb and ICD with BiV upgrade (MDT) in 2022 who presented 8/20 with acute HF  Assessment & Plan      Heart Failure Reduced Ejection Fraction  - NYHA class II, Stage C, hypervolemic, etiology unclear, exacerbation related to stoping her torsemide - Diuretic regimen: 0.5 mg bumex IV BID,  (patient notes that at higher doses had critical hypotension (with lasix); BNP tomorrow  Lasix allergy Statin myalgias pravastatin and rosuvastatin and atorvastatin - Discussed the importance of fluid restriction of < 2 L, salt restriction, and checking daily weights  -  Replace electrolytes PRN and keep K>4 and Mg>2. - Coreg 3.125 mg PO BID; unable to titrate due to BP see 8/21 note for discussion on limitations  in GDMT and her various allergies - home entresto dose held for BP - SGLT2i allergy - Continue lower dose aldactone 12.5 mg PO daily (home dose is 25 mg )  PAF - CHADSVASC 4 - this appears to be new: I have offered her [place]; she will address this her her primary cardiologist due to allergy concerns; we have discussed her stoke risk  Potential DC 8/23-8/24 Has f/u in September  For questions or updates, please contact [facility] Please consult [email or web address] for contact info under Cardiology/STEMI.      Rudean Haskell, MD [address], [address] [place], [place] [address] [phone]  8:47 AM

## 2022-01-04 DIAGNOSIS — N179 Acute kidney failure, unspecified: Secondary | ICD-10-CM

## 2022-01-04 DIAGNOSIS — J189 Pneumonia, unspecified organism: Secondary | ICD-10-CM

## 2022-01-04 DIAGNOSIS — I480 Paroxysmal atrial fibrillation: Secondary | ICD-10-CM | POA: Diagnosis present

## 2022-01-04 DIAGNOSIS — I5043 Acute on chronic combined systolic (congestive) and diastolic (congestive) heart failure: Secondary | ICD-10-CM | POA: Diagnosis not present

## 2022-01-04 DIAGNOSIS — I10 Essential (primary) hypertension: Secondary | ICD-10-CM | POA: Diagnosis not present

## 2022-01-04 DIAGNOSIS — Z95810 Presence of automatic (implantable) cardiac defibrillator: Secondary | ICD-10-CM | POA: Diagnosis not present

## 2022-01-04 DIAGNOSIS — J9601 Acute respiratory failure with hypoxia: Secondary | ICD-10-CM | POA: Diagnosis not present

## 2022-01-04 DIAGNOSIS — J9602 Acute respiratory failure with hypercapnia: Secondary | ICD-10-CM | POA: Diagnosis not present

## 2022-01-04 LAB — BASIC METABOLIC PANEL
Anion gap: 11 (ref 5–15)
BUN: 25 mg/dL — ABNORMAL HIGH (ref 8–23)
CO2: 23 mmol/L (ref 22–32)
Calcium: 9.8 mg/dL (ref 8.9–10.3)
Chloride: 104 mmol/L (ref 98–111)
Creatinine, Ser: 0.98 mg/dL (ref 0.44–1.00)
GFR, Estimated: 60 mL/min — ABNORMAL LOW (ref >60)
Glucose, Bld: 90 mg/dL (ref 70–99)
Potassium: 4 mmol/L (ref 3.5–5.1)
Sodium: 138 mmol/L (ref 135–145)

## 2022-01-04 LAB — MAGNESIUM: Magnesium: 2.1 mg/dL (ref 1.7–2.4)

## 2022-01-04 NOTE — Assessment & Plan Note (Addendum)
Hyperkalemia.   Stable renal function with serum cr at 1,0, K is 4,2 and serum bicarbonate at 26. Continue close monitoring of renal function and electrolytes.

## 2022-01-04 NOTE — Progress Notes (Signed)
SATURATION QUALIFICATIONS: (This note is used to comply with regulatory documentation for home oxygen)  Patient Saturations on Room Air at Rest = 95%  Patient Saturations on Room Air while Ambulating = 92%  Patient Saturations on N/A Liters of oxygen while Ambulating = N/A%  Please briefly explain why patient needs home oxygen:

## 2022-01-04 NOTE — Assessment & Plan Note (Signed)
Continue with home regimen including lorazepam.

## 2022-01-04 NOTE — Progress Notes (Signed)
Progress Note  Patient Name: Ashley [name] Date of Encounter: 01/04/2022  Primary Cardiologist: [name], MD   Subjective   Felt anxious over night. Recounts the issues with her husbands thyroid cancer; she feels clostrophobic here. Needs O2 with walking  Inpatient Medications    Scheduled Meds:  aspirin EC  81 mg Oral QHS   budesonide  0.5 mg Nebulization BID   bumetanide (BUMEX) IV  0.5 mg Intravenous Q12H   carvedilol  3.125 mg Oral BID WC   enoxaparin (LOVENOX) injection  40 mg Subcutaneous Q24H   magnesium oxide  400 mg Oral Daily   senna  1 tablet Oral BID   sodium chloride flush  3 mL Intravenous Q12H   Continuous Infusions:  cefTRIAXone (ROCEPHIN)  IV 2 g (01/04/22 0911)   magnesium sulfate bolus IVPB     PRN Meds: acetaminophen **OR** acetaminophen, hydrocortisone, levalbuterol, LORazepam, miconazole, Muscle Rub, nystatin cream, mouth rinse, polyethylene glycol   Vital Signs    Vitals:   01/04/22 0434 01/04/22 0600 01/04/22 0800 01/04/22 0809  BP: 109/65   107/61  Pulse: 85 94 85   Resp: 16   20  Temp: 98 F (36.7 C)   97.7 F (36.5 C)  TempSrc: Oral   Oral  SpO2: 96% 92% 97%   Weight: 80.9 kg     Height:        Intake/Output Summary (Last 24 hours) at 01/04/2022 1013 Last data filed at 01/04/2022 2979 Gross per 24 hour  Intake --  Output 900 ml  Net -900 ml   Filed Weights   01/02/22 0422 01/03/22 0417 01/04/22 0434  Weight: 82.1 kg 81.1 kg 80.9 kg    Telemetry    His Pacing- Personally Reviewed  Physical Exam   Gen: No distress  Neck: No JVD,  Cardiac: No Rubs or Gallops, no Murmur, RRR +2 radial pulses Respiratory: CTAB normal effort, normal rate GI: Soft, nontender, non-distended  MS: No  edema;  moves all extremities Integument: Skin feels warm Neuro:  At time of evaluation, alert and oriented to person/place/time/situation  Psych: Normal affect, patient feels worse  O2 sat 94% on RA   Labs     Chemistry Recent Labs  Lab 01/02/22 0725 01/03/22 0428 01/04/22 0447  NA 141 139 138  K 3.8 4.1 4.0  CL 108 105 104  CO2 '24 23 23  '$ GLUCOSE 100* 99 90  BUN 18 22 25*  CREATININE 0.95 1.09* 0.98  CALCIUM 9.4 9.8 9.8  GFRNONAA >60 53* 60*  ANIONGAP '9 11 11     '$ Hematology Recent Labs  Lab 01/01/22 0453 01/01/22 0502 01/02/22 0725 01/03/22 0428  WBC 19.3*  --  10.0 11.2*  RBC 4.75  --  3.79* 3.79*  HGB 14.4 13.9 11.4* 11.4*  HCT 46.2* 41.0 36.2 34.5*  MCV 97.3  --  95.5 91.0  MCH 30.3  --  30.1 30.1  MCHC 31.2  --  31.5 33.0  RDW 13.2  --  13.2 13.0  PLT 273  --  159 193    Cardiac EnzymesNo results for input(s): "TROPONINI" in the last 168 hours. No results for input(s): "TROPIPOC" in the last 168 hours.   BNP Recent Labs  Lab 01/01/22 0453  BNP 2,040.8*     DDimer No results for input(s): "DDIMER" in the last 168 hours.   Radiology    No results found.  Patient Profile     76 y.o. female NICM with significant functional MR,  s/p iLBBb and ICD with BiV upgrade (MDT) in 2022 who presented 8/20 with acute HF  Assessment & Plan     Heart Failure Reduced Ejection Fraction  - NYHA class II, Stage C, hypervolemic, etiology unclear, exacerbation related to stoping her torsemide - Diuretic regimen: 0.5 mg bumex IV BID,  (patient notes that at higher doses had critical hypotension (with lasix); BNP tomorrow; if still significantly elevated will increase bumex dose Lasix allergy Statin myalgias pravastatin and rosuvastatin and atorvastatin - Discussed the importance of fluid restriction of < 2 L, salt restriction, and checking daily weights  -  Replace electrolytes PRN and keep K>4 and Mg>2. - Coreg 3.125 mg PO BID; unable to titrate due to BP see 8/21 note for discussion on limitations in GDMT and her various allergies - home entresto dose held for BP - SGLT2i allergy - Continue lower dose aldactone 12.5 mg PO daily (home dose is 25 mg ) - MDT plans to optimize  His Bundle Pacing with Dr. Lovena Le today  PAF - CHADSVASC 4 - this appears to be new: she again was chosen to wait for DOAC until after seeing Dr. Darnell Level; we have discussed risks and benefits   Has f/u in September  For questions or updates, please contact [facility] Please consult [email or web address] for contact info under Cardiology/STEMI.      Rudean Haskell, MD [place]  [address] [place], [place] [address] [phone]  10:13 AM

## 2022-01-04 NOTE — Progress Notes (Signed)
Around 2300, says "I just don't feel well". Reports 2/10 headache, "slight" tightness in chest worse with deep breath and 2/10 cramps in legs. Walking independently in room and talking in long full sentences; resps unlabored, no wheezing denies feeling SOB.   Night MD notified. EKG done per order and MD ordered IV magnesium.  Pt declined IV magnesium - wants to wait for am labs result in morning and take PO mag if not too low. Now just wants to try and sleep.   Later pt called concerned that pulse oximeter reading dropped to 86 briefly then back up. Denies SOB but says "when it beeped my headache got worse for a little bit". RN reviewed tele history and when sats dropped, pulse oximeter waveform had artifact and was a not reliable reading- informed pt of this and that sats 98% 2Lnc now so no need to increase O2.   Later pt called stating she still has slight tightness in her chest and feels like it's tight in her throat and is having a hard time coughing up some phlegm and feels congested. Pt is coughing more frequently tonight and keeps clearing her throat. Used nasal saline nasal spray. Declined mucinex, robitussin DM, cough drops, antihistamines or nebulizers stating she can't take them due to side effects. Elevated head of bed and gave pt flutter valve and educated on use. Lung sounds clear, no wheezing. Talking in full long sentences. Denies sore throat. Humidifier placed on O2 to help prevent drying of any secretions but pt states it "smells like gasoline" so humidifier removed. Pt likes flutter valve and feels somewhat better with HOB elevated. Able to sleep for a couple hours.  Around 0600 woke up and used bathroom. Denies headache or SOB or chest tightness. Says she did not sleep well and feels like her thoughts are "all over the place". Declined anxiety meds. Walked around room for a while then back to bed and dozed to sleep.

## 2022-01-04 NOTE — Assessment & Plan Note (Addendum)
Heart rate has been well controlled with B blockade, she has declined anticoagulation. Explained risk of CVA Plan to follow up as outpatient.

## 2022-01-04 NOTE — Assessment & Plan Note (Addendum)
Suspected community acquired pneumonia, plan to continue antibiotic therapy with ceftriaxone. She has completed 5 doses.

## 2022-01-04 NOTE — Assessment & Plan Note (Addendum)
Echocardiogram with reduced LV systolic function EF 25 to 35%.  Urine output 2100 cc over last 24 hrs. Systolic blood pressure is 94 to 108 mmHg.   Continue with carvedilol. Transition to torsemide in am.  Patient with multiple allergies and refusing some medications, with limited pharmacological options.   Acute hypoxemic respiratory failure due to acute cardiogenic pulmonary edema Respiratory failure has resolved, her 02 saturation on room air is 96%.

## 2022-01-04 NOTE — Assessment & Plan Note (Addendum)
Continue blood pressure monitoring., keep MAP 65 or greater.  Continue with carvedilol Diuresis with torsemide starting in am.

## 2022-01-04 NOTE — Progress Notes (Signed)
   01/04/22 1500  Mobility  Activity Ambulated independently in hallway  Level of Assistance Independent  Assistive Device None  Distance Ambulated (ft) 520 ft  Activity Response Tolerated well  $Mobility charge 1 Mobility   Mobility Specialist Progress Note  Received pt in bed having no complaints and agreeable to mobility. Pt was asymptomatic throughout ambulation and returned to room w/o fault. Left in bed w/ call bell in reach and all needs met.   Lucious Groves Mobility Specialist

## 2022-01-04 NOTE — TOC Progression Note (Addendum)
Transition of Care [facility]) - Progression Note    Patient Details  Name: Ashley [name] MRN: [id number] Date of Birth: [birth year]  [facility]) CM/SW Contact  Zenon Mayo, RN Phone Number: 01/04/2022, 11:24 AM  Clinical Narrative:    from home with spouse, indep, acute resp failure,  ? PNA has 28 allergies, lasix is one of the, conts on iv bumex, iv rocejphin. She  weighs herself daily, she checks her bp 2-3 times a week, she states she tries to cut down on her sodium intake, she eats lots of fruits and veges, baked chicken and salmon.  She gets her [facility] from [facility] on [address] in [place]. She also states she has oxygen at home from when she had covid thru [employer], when she starts coughing she put on the oxygen and this helps her to stop coughing.  She does not have any HH services at this time. TOC following.        Expected Discharge Plan and Services                                                 Social Determinants of Health (SDOH) Interventions    Readmission Risk Interventions     No data to display

## 2022-01-04 NOTE — Progress Notes (Signed)
  Progress Note   Patient: Ashley [name]:[id number] DOB: 03/14/1946 DOA: 01/01/2022     3 DOS: the patient was seen and examined on 01/04/2022   Brief hospital course: Mrs. Gensel was admitted to the hospital with the working diagnosis of heart failure.   76 yo female with the past medical history of heart failure, hypertension, asthma and partial colon resection due to perforated diverticulitis who presented with dyspnea. Reported 3 days of worsening dyspnea, despite use of nocturnal 02. On he initial physical examination her blood pressure was 125/76, HR 108, RR 24 and 02 saturation 97%, lungs with rales bilaterally with no rhonchi, abdomen not distended and trace lower extremity edema.   Patient was placed on IV bumetanide for diuresis.   Possible pneumonia, placed on antibiotic therapy.  Paroxysmal atrial fibrillation, patient declined anticoagulation   Assessment and Plan: Acute on chronic combined systolic and diastolic CHF (congestive heart failure) (HCC) Echocardiogram with reduced LV systolic function EF 25 to 35%.  Urine output is documented 900  Plan to continue diuresis with IV bumetanide and continue with carvedilol. Patient with multiple allergies and refusing some medications, with limited pharmacological options.   Acute hypoxemic respiratory failure due to acute cardiogenic pulmonary edema Continue with supplemental 02 per [place].   Pneumonia Suspected community acquired pneumonia, plan to continue antibiotic therapy with ceftriaxone, to complete 5 days.   Paroxysmal atrial fibrillation (HCC) Continue close telemetry monitoring, patient has declined anticoagulation.   Essential hypertension Continue blood pressure monitoring.   AKI (acute kidney injury) ([place]) Hyperkalemia.   Continue close monitoring of renal function and electrolytes. Continue diuresis.   Anxiety state Continue with home regimen including lorazepam.         Subjective: Patient  with improvement in her symptoms but not yet back to baseline.   Physical Exam: Vitals:   01/04/22 1228 01/04/22 1400 01/04/22 1600 01/04/22 1636  BP: 105/60   101/70  Pulse:  86 86 87  Resp: 18   20  Temp: 98.4 F (36.9 C)     TempSrc: Oral   Oral  SpO2:  97% 93% 95%  Weight:      Height:       Neurology awake and alert ENT with mild pallor Cardiovascular with S1 and S2 present withy no galllps Respiratory with no rales Abdomen not distended  No lower extremity edema  Data Reviewed:    Family Communication: I spoke with patient's husband at the bedside, we talked in detail about patient's condition, plan of care and prognosis and all questions were addressed.   Disposition: Status is: Inpatient Remains inpatient appropriate because: heart failure   Planned Discharge Destination: Home    Author: Tawni Millers, MD 01/04/2022 4:40 PM  For on call review [email or web address].

## 2022-01-04 NOTE — Hospital Course (Addendum)
Ashley [name] was admitted to the hospital with the working diagnosis of heart failure.   76 yo female with the past medical history of heart failure, hypertension, asthma and partial colon resection due to perforated diverticulitis who presented with dyspnea. Reported 3 days of worsening dyspnea, despite use of nocturnal 02. On he initial physical examination her blood pressure was 125/76, HR 108, RR 24 and 02 saturation 97%, lungs with rales bilaterally with no rhonchi, abdomen not distended and trace lower extremity edema.   pH 7,16, Pc02 50, Pa02 88, bicarbonate 20, 02 saturation 94%.  Na 130, K 5,5 CL 017, bicarbonate 11, glucose 249, bun 22 cr 1.21 BNP 2,040 High sensitive troponin 16 and 63  Wbc 19,3 hgb 14,4 plt 273   Chest radiograph with bilateral interstitial infiltrates, bilateral hilar vascular congestion and cephalization of the vasculature. Pace make defibrillator in place with one atrial and two ventricular lead.   EKG 134 bpm, left axis deviation, left bundle branch block with normal qtc, sinus rhythm with poor R R wave progression, with no significant ST segment or T wave changes.   Patient was placed on IV bumetanide for diuresis.   Possible pneumonia, placed on antibiotic therapy, for 5 days.  Paroxysmal atrial fibrillation, patient declined anticoagulation  Her volume status has improved and patient will follow up as outpatient with primary care and cardiology. Continue holding on Entresto due to risk of hypotension.

## 2022-01-05 DIAGNOSIS — N179 Acute kidney failure, unspecified: Secondary | ICD-10-CM | POA: Diagnosis not present

## 2022-01-05 DIAGNOSIS — I10 Essential (primary) hypertension: Secondary | ICD-10-CM | POA: Diagnosis not present

## 2022-01-05 DIAGNOSIS — I480 Paroxysmal atrial fibrillation: Secondary | ICD-10-CM

## 2022-01-05 DIAGNOSIS — Z95810 Presence of automatic (implantable) cardiac defibrillator: Secondary | ICD-10-CM | POA: Diagnosis not present

## 2022-01-05 DIAGNOSIS — I5043 Acute on chronic combined systolic (congestive) and diastolic (congestive) heart failure: Secondary | ICD-10-CM | POA: Diagnosis not present

## 2022-01-05 LAB — BASIC METABOLIC PANEL
Anion gap: 10 (ref 5–15)
BUN: 23 mg/dL (ref 8–23)
CO2: 24 mmol/L (ref 22–32)
Calcium: 10 mg/dL (ref 8.9–10.3)
Chloride: 106 mmol/L (ref 98–111)
Creatinine, Ser: 1.06 mg/dL — ABNORMAL HIGH (ref 0.44–1.00)
GFR, Estimated: 54 mL/min — ABNORMAL LOW (ref >60)
Glucose, Bld: 87 mg/dL (ref 70–99)
Potassium: 3.8 mmol/L (ref 3.5–5.1)
Sodium: 140 mmol/L (ref 135–145)

## 2022-01-05 LAB — BRAIN NATRIURETIC PEPTIDE: B Natriuretic Peptide: 889.1 pg/mL — ABNORMAL HIGH (ref 0.0–100.0)

## 2022-01-05 MED ORDER — BUMETANIDE 0.25 MG/ML IJ SOLN
0.5000 mg | Freq: Every day | INTRAMUSCULAR | Status: DC
  Filled 2022-01-05: qty 2

## 2022-01-05 MED ORDER — BUMETANIDE 0.25 MG/ML IJ SOLN: 1.0000 mg | Freq: Every day | INTRAMUSCULAR | Status: DC

## 2022-01-05 MED ORDER — BUMETANIDE 0.25 MG/ML IJ SOLN
1.0000 mg | Freq: Every day | INTRAMUSCULAR | Status: DC
Start: 2022-01-05 — End: 2022-01-06
  Administered 2022-01-05 – 2022-01-06 (×2): 1 mg via INTRAVENOUS
  Filled 2022-01-05 (×2): qty 4

## 2022-01-05 MED ORDER — POTASSIUM CHLORIDE CRYS ER 20 MEQ PO TBCR
20.0000 meq | EXTENDED_RELEASE_TABLET | Freq: Two times a day (BID) | ORAL | Status: DC
  Administered 2022-01-05 – 2022-01-06 (×3): 20 meq via ORAL
  Filled 2022-01-05 (×3): qty 1

## 2022-01-05 MED ORDER — BUMETANIDE 0.25 MG/ML IJ SOLN
0.5000 mg | Freq: Two times a day (BID) | INTRAMUSCULAR | Status: DC
Start: 2022-01-05 — End: 2022-01-05

## 2022-01-05 NOTE — Plan of Care (Signed)
  Problem: Clinical Measurements: Goal: Will remain free from infection Outcome: Completed/Met   Problem: Elimination: Goal: Will not experience complications related to bowel motility Outcome: Completed/Met Goal: Will not experience complications related to urinary retention Outcome: Completed/Met   Problem: Pain Managment: Goal: General experience of comfort will improve Outcome: Completed/Met   Problem: Skin Integrity: Goal: Risk for impaired skin integrity will decrease Outcome: Completed/Met

## 2022-01-05 NOTE — Progress Notes (Signed)
  Progress Note   Patient: Ashley [name] KZS:[id number] DOB: 01-31-1946 DOA: 01/01/2022     4 DOS: the patient was seen and examined on 01/05/2022   Brief hospital course: Ashley [name] was admitted to the hospital with the working diagnosis of heart failure.   76 yo female with the past medical history of heart failure, hypertension, asthma and partial colon resection due to perforated diverticulitis who presented with dyspnea. Reported 3 days of worsening dyspnea, despite use of nocturnal 02. On he initial physical examination her blood pressure was 125/76, HR 108, RR 24 and 02 saturation 97%, lungs with rales bilaterally with no rhonchi, abdomen not distended and trace lower extremity edema.   Patient was placed on IV bumetanide for diuresis.   Possible pneumonia, placed on antibiotic therapy.  Paroxysmal atrial fibrillation, patient declined anticoagulation   Assessment and Plan: Acute on chronic combined systolic and diastolic CHF (congestive heart failure) (HCC) Echocardiogram with reduced LV systolic function EF 25 to 35%.  Urine output 1500 cc over last 24 hrs. Systolic blood pressure is 98 to 100 mmHg.   Patient continue with volume overload.   Plan to continue diuresis with IV bumetanide and continue with carvedilol. Patient with multiple allergies and refusing some medications, with limited pharmacological options.   Acute hypoxemic respiratory failure due to acute cardiogenic pulmonary edema Continue with supplemental 02 per [place].  Oxymetry is 97% on 1 L/min per [place].   Pneumonia Suspected community acquired pneumonia, plan to continue antibiotic therapy with ceftriaxone, to complete 5 days.   Paroxysmal atrial fibrillation (HCC) Continue close telemetry monitoring, patient has declined anticoagulation.   Essential hypertension Continue blood pressure monitoring.   AKI (acute kidney injury) ([place]) Hyperkalemia.   Renal function with serum cr at 1,0 with K a 3,8 and  serum bicarbonate at 24. Plan to continue diuresis with IV bumetanide.  Follow up renal function in am.   Anxiety state Continue with home regimen including lorazepam.         Subjective: Patient with dyspnea on exertion   Physical Exam: Vitals:   01/05/22 0827 01/05/22 1100 01/05/22 1200 01/05/22 1300  BP: (!) 107/48  93/64   Pulse: 89 77 80 80  Resp: 20  18   Temp: 97.9 F (36.6 C)     TempSrc: Oral  Oral   SpO2: 95% 99% 99% 95%  Weight:      Height:       Neurology awake and alert ENT with mild pallor Cardiovascular with mild JVD, heart with S1 and S2 present with no gallops Respiratory with bilateral rales but no wheezing Abdomen not distended No lower extremity edema  Data Reviewed:     Family Communication: I spoke with patient's husband at the bedside, we talked in detail about patient's condition, plan of care and prognosis and all questions were addressed.  Disposition: Status is: Inpatient Remains inpatient appropriate because: heart failure   Planned Discharge Destination: Home    Author: Tawni Millers, MD 01/05/2022 1:53 PM  For on call review [email or web address].

## 2022-01-05 NOTE — Progress Notes (Signed)
Patient has declined the use of Pulmicort nebulizer since she has been admitted. She states that she does not take it and will not take it because its not part of her home regimen.

## 2022-01-05 NOTE — Progress Notes (Signed)
Progress Note  Patient Name: Ashley [name] Date of Encounter: 01/05/2022  Primary Cardiologist: [name], MD   Subjective   BNP is still elevated Patient notes that she gets a catch with a deep breath  Notes bendoypnea.  She heard a wheeze this AM   Inpatient Medications    Scheduled Meds:  aspirin EC  81 mg Oral QHS   budesonide  0.5 mg Nebulization BID   bumetanide (BUMEX) IV  0.5 mg Intravenous BID   carvedilol  3.125 mg Oral BID WC   enoxaparin (LOVENOX) injection  40 mg Subcutaneous Q24H   magnesium oxide  400 mg Oral Daily   senna  1 tablet Oral BID   sodium chloride flush  3 mL Intravenous Q12H   Continuous Infusions:  cefTRIAXone (ROCEPHIN)  IV Stopped (01/04/22 0912)   magnesium sulfate bolus IVPB     PRN Meds: acetaminophen **OR** acetaminophen, hydrocortisone, levalbuterol, LORazepam, miconazole, Muscle Rub, nystatin cream, mouth rinse, polyethylene glycol   Vital Signs    Vitals:   01/05/22 0030 01/05/22 0200 01/05/22 0446 01/05/22 0451  BP:    (!) 100/55  Pulse: 81 76  83  Resp:    18  Temp:    97.8 F (36.6 C)  TempSrc:    Oral  SpO2: 95% 95%  97%  Weight:   79.9 kg   Height:        Intake/Output Summary (Last 24 hours) at 01/05/2022 0805 Last data filed at 01/05/2022 0440 Gross per 24 hour  Intake 821.45 ml  Output 1500 ml  Net -678.55 ml   Filed Weights   01/03/22 0417 01/04/22 0434 01/05/22 0446  Weight: 81.1 kg 80.9 kg 79.9 kg    Telemetry    Hib Bundle Pacing- Personally Reviewed  Physical Exam   Gen: No distress  Neck: No JVD,  Cardiac: No Rubs or Gallops, no Murmur, RRR +2 radial pulses Respiratory: CTAB normal effort, normal rate GI: Soft, nontender, non-distended  MS: No  edema;  moves all extremities Integument: Skin feels warm Neuro:  At time of evaluation, alert and oriented to person/place/time/situation  Psych: Normal affect, patient feels worse  Labs    Chemistry Recent Labs  Lab 01/03/22 0428  01/04/22 0447 01/05/22 0434  NA 139 138 140  K 4.1 4.0 3.8  CL 105 104 106  CO2 '23 23 24  '$ GLUCOSE 99 90 87  BUN 22 25* 23  CREATININE 1.09* 0.98 1.06*  CALCIUM 9.8 9.8 10.0  GFRNONAA 53* 60* 54*  ANIONGAP '11 11 10     '$ Hematology Recent Labs  Lab 01/01/22 0453 01/01/22 0502 01/02/22 0725 01/03/22 0428  WBC 19.3*  --  10.0 11.2*  RBC 4.75  --  3.79* 3.79*  HGB 14.4 13.9 11.4* 11.4*  HCT 46.2* 41.0 36.2 34.5*  MCV 97.3  --  95.5 91.0  MCH 30.3  --  30.1 30.1  MCHC 31.2  --  31.5 33.0  RDW 13.2  --  13.2 13.0  PLT 273  --  159 193    Cardiac EnzymesNo results for input(s): "TROPONINI" in the last 168 hours. No results for input(s): "TROPIPOC" in the last 168 hours.   BNP Recent Labs  Lab 01/01/22 0453 01/05/22 0434  BNP 2,040.8* 889.1*     DDimer No results for input(s): "DDIMER" in the last 168 hours.   Radiology    No results found.  Patient Profile     76 y.o. female NICM with significant functional  MR, s/p iLBBb and ICD with BiV upgrade (MDT) in 2022 who presented 8/20 with acute HF  Assessment & Plan     Heart Failure Reduced Ejection Fraction  - NYHA class II, Stage C, hypervolemic, etiology unclear, exacerbation related to stoping her torsemide - Diuretic regimen: SDM: Patient is amenable to trying higher dose of diuretic; will trial 1 mg IV AM and 0.5 mg IVP PM with K supplementation, holding her home aldactone. Lasix allergy Statin myalgias pravastatin and rosuvastatin and atorvastatin - Discussed the importance of fluid restriction of < 2 L, salt restriction, and checking daily weights  -  Replace electrolytes PRN and keep K>4 and Mg>2. - Coreg 3.125 mg PO BID; unable to titrate due to BP see 8/21 note for discussion on limitations in GDMT and her various allergies - home entresto dose held for BP - SGLT2i allergy - MDT plans to optimize His Bundle Pacing with Dr. Lovena Le either this admission of later  PAF - CHADSVASC 4 - new- patient had  declined [facility]   For questions or updates, please contact [facility] Please consult [email or web address] for contact info under Cardiology/STEMI.      Rudean Haskell, MD [address] [place], [address] [place], [place] [address] [phone]  8:05 AM

## 2022-01-05 NOTE — Progress Notes (Signed)
MD please address, pt has a bruise or abrasion just above her R groin, pt was concerned about it, Thanks Buckner [place].

## 2022-01-06 DIAGNOSIS — I10 Essential (primary) hypertension: Secondary | ICD-10-CM | POA: Diagnosis not present

## 2022-01-06 DIAGNOSIS — Z95810 Presence of automatic (implantable) cardiac defibrillator: Secondary | ICD-10-CM | POA: Diagnosis not present

## 2022-01-06 DIAGNOSIS — I480 Paroxysmal atrial fibrillation: Secondary | ICD-10-CM | POA: Diagnosis not present

## 2022-01-06 DIAGNOSIS — I5043 Acute on chronic combined systolic (congestive) and diastolic (congestive) heart failure: Secondary | ICD-10-CM | POA: Diagnosis not present

## 2022-01-06 DIAGNOSIS — F411 Generalized anxiety disorder: Secondary | ICD-10-CM | POA: Diagnosis not present

## 2022-01-06 DIAGNOSIS — N179 Acute kidney failure, unspecified: Secondary | ICD-10-CM | POA: Diagnosis not present

## 2022-01-06 LAB — CULTURE, BLOOD (ROUTINE X 2)
Culture: NO GROWTH
Special Requests: ADEQUATE

## 2022-01-06 LAB — BASIC METABOLIC PANEL
Anion gap: 6 (ref 5–15)
BUN: 29 mg/dL — ABNORMAL HIGH (ref 8–23)
CO2: 23 mmol/L (ref 22–32)
Calcium: 9.6 mg/dL (ref 8.9–10.3)
Chloride: 109 mmol/L (ref 98–111)
Creatinine, Ser: 0.93 mg/dL (ref 0.44–1.00)
GFR, Estimated: >60 mL/min (ref >60)
Glucose, Bld: 97 mg/dL (ref 70–99)
Potassium: 4.4 mmol/L (ref 3.5–5.1)
Sodium: 138 mmol/L (ref 135–145)

## 2022-01-06 LAB — MAGNESIUM: Magnesium: 2.1 mg/dL (ref 1.7–2.4)

## 2022-01-06 MED ORDER — BUMETANIDE 0.25 MG/ML IJ SOLN
0.5000 mg | Freq: Every day | INTRAMUSCULAR | Status: DC
  Administered 2022-01-06 – 2022-01-07 (×2): 0.5 mg via INTRAVENOUS
  Filled 2022-01-06 (×3): qty 2

## 2022-01-06 MED ORDER — BUMETANIDE 0.25 MG/ML IJ SOLN
1.0000 mg | Freq: Every day | INTRAMUSCULAR | Status: DC
  Administered 2022-01-07: 1 mg via INTRAVENOUS
  Filled 2022-01-06 (×2): qty 4

## 2022-01-06 MED ORDER — BUMETANIDE 0.25 MG/ML IJ SOLN
1.0000 mg | Freq: Two times a day (BID) | INTRAMUSCULAR | Status: DC
  Administered 2022-01-06: 1 mg via INTRAVENOUS
  Filled 2022-01-06: qty 4

## 2022-01-06 NOTE — Progress Notes (Signed)
Progress Note  Patient Name: Ashley [name] Date of Encounter: 01/06/2022  Primary Cardiologist: [name], MD   Subjective   No hypotension with diuretics in AM. Patient notes that she refused the diuretics in the PM because she misunderstood.   Subsequently had no SOB.  Inpatient Medications    Scheduled Meds:  aspirin EC  81 mg Oral QHS   budesonide  0.5 mg Nebulization BID   bumetanide (BUMEX) IV  0.5 mg Intravenous q1800   bumetanide (BUMEX) IV  1 mg Intravenous QAC breakfast   carvedilol  3.125 mg Oral BID WC   enoxaparin (LOVENOX) injection  40 mg Subcutaneous Q24H   magnesium oxide  400 mg Oral Daily   potassium chloride  20 mEq Oral BID   senna  1 tablet Oral BID   sodium chloride flush  3 mL Intravenous Q12H   Continuous Infusions:  cefTRIAXone (ROCEPHIN)  IV Stopped (01/05/22 1107)   magnesium sulfate bolus IVPB     PRN Meds: acetaminophen **OR** acetaminophen, hydrocortisone, levalbuterol, LORazepam, miconazole, Muscle Rub, nystatin cream, mouth rinse, polyethylene glycol   Vital Signs    Vitals:   01/06/22 0139 01/06/22 0503 01/06/22 0652 01/06/22 0745  BP: (!) 101/56 99/61 (!) 102/55 (!) 110/55  Pulse: 74 73 81 81  Resp: '18 18  18  '$ Temp:  99.1 F (37.3 C)  97.7 F (36.5 C)  TempSrc:  Oral  Oral  SpO2: 99% 95%  97%  Weight:  80.3 kg    Height:        Intake/Output Summary (Last 24 hours) at 01/06/2022 0814 Last data filed at 01/06/2022 0747 Gross per 24 hour  Intake 800 ml  Output 1026 ml  Net -226 ml   Filed Weights   01/04/22 0434 01/05/22 0446 01/06/22 0503  Weight: 80.9 kg 79.9 kg 80.3 kg    Telemetry    Hib Bundle Pacing rare PVCs- Personally Reviewed  Physical Exam   Gen: No distress  Neck: No JVD,  Cardiac: No rubs or gallops, no murmur, RRR +2 radial pulses Respiratory: CTAB normal effort, normal rate GI: Soft, nontender, non-distended  MS: No  edema;  moves all extremities Integument: Skin feels warm Neuro:   At time of evaluation, alert and oriented to person/place/time/situation  Psych: Normal affect, patient feels worse  Labs    Chemistry Recent Labs  Lab 01/04/22 0447 01/05/22 0434 01/06/22 0458  NA 138 140 138  K 4.0 3.8 4.4  CL 104 106 109  CO2 '23 24 23  '$ GLUCOSE 90 87 97  BUN 25* 23 29*  CREATININE 0.98 1.06* 0.93  CALCIUM 9.8 10.0 9.6  GFRNONAA 60* 54* >60  ANIONGAP '11 10 6     '$ Hematology Recent Labs  Lab 01/01/22 0453 01/01/22 0502 01/02/22 0725 01/03/22 0428  WBC 19.3*  --  10.0 11.2*  RBC 4.75  --  3.79* 3.79*  HGB 14.4 13.9 11.4* 11.4*  HCT 46.2* 41.0 36.2 34.5*  MCV 97.3  --  95.5 91.0  MCH 30.3  --  30.1 30.1  MCHC 31.2  --  31.5 33.0  RDW 13.2  --  13.2 13.0  PLT 273  --  159 193    Cardiac EnzymesNo results for input(s): "TROPONINI" in the last 168 hours. No results for input(s): "TROPIPOC" in the last 168 hours.   BNP Recent Labs  Lab 01/01/22 0453 01/05/22 0434  BNP 2,040.8* 889.1*     DDimer No results for input(s): "DDIMER" in the  last 168 hours.   Radiology    No results found.  Patient Profile     76 y.o. female NICM with significant functional MR, s/p iLBBb and ICD with BiV upgrade (MDT) in 2022 who presented 8/20 with acute HF  Assessment & Plan     Heart Failure Reduced Ejection Fraction  - NYHA class II, Stage C, hypervolemic, etiology unclear, exacerbation related to stoping her torsemide - Diuretic regimen: increasing to bumex 1 mg IV AM and 0.5 mg after long discussion Lasix allergy Statin myalgias pravastatin and rosuvastatin and atorvastatin -  Replace electrolytes PRN and keep K>4 and Mg>2. - Coreg 3.125 mg PO BID; unable to titrate due to BP see 8/21 note for discussion on limitations in GDMT and her various allergies - home entresto dose held for BP - SGLT2i allergy - planned for likely outpatient His Bundle Pacing optimization (Dr. Lovena Le) - see prior notes for more indepth discussion of her allergies  PAF -  CHADSVASC 4 - new- patient had declined AC (see prior notes for discussion)   For questions or updates, please contact [facility] Please consult [email or web address] for contact info under Cardiology/STEMI.      Rudean Haskell, MD [place]  [name] [place], [place] [address] [phone]  8:14 AM

## 2022-01-06 NOTE — Care Management Important Message (Signed)
Important Message  Patient Details  Name: Ashley [name] MRN: [id number] Date of Birth: [birth year]   Medicare Important Message Given:  Yes     Shelda Altes 01/06/2022, 10:36 AM

## 2022-01-06 NOTE — Progress Notes (Signed)
  Progress Note   Patient: Ashley [name] NFA:[id number] DOB: 04-20-1946 DOA: 01/01/2022     5 DOS: the patient was seen and examined on 01/06/2022   Brief hospital course: Ashley [name] was admitted to the hospital with the working diagnosis of heart failure.   76 yo female with the past medical history of heart failure, hypertension, asthma and partial colon resection due to perforated diverticulitis who presented with dyspnea. Reported 3 days of worsening dyspnea, despite use of nocturnal 02. On he initial physical examination her blood pressure was 125/76, HR 108, RR 24 and 02 saturation 97%, lungs with rales bilaterally with no rhonchi, abdomen not distended and trace lower extremity edema.   Patient was placed on IV bumetanide for diuresis.   Possible pneumonia, placed on antibiotic therapy.  Paroxysmal atrial fibrillation, patient declined anticoagulation  Continue diuresis with bumetanide.   Assessment and Plan: Acute on chronic combined systolic and diastolic CHF (congestive heart failure) (HCC) Echocardiogram with reduced LV systolic function EF 25 to 35%.  Urine output 1025 cc over last 24 hrs. Systolic blood pressure is 98 to 100 mmHg.   Plan to continue diuresis with IV bumetanide and continue with carvedilol. Patient with multiple allergies and refusing some medications, with limited pharmacological options.   Acute hypoxemic respiratory failure due to acute cardiogenic pulmonary edema Continue with supplemental 02 per [name].  Oxymetry is 97 to 95% on 1 L/min per [name].   Pneumonia Suspected community acquired pneumonia, plan to continue antibiotic therapy with ceftriaxone. She has completed 5 doses.   Paroxysmal atrial fibrillation (HCC) Continue close telemetry monitoring, patient has declined anticoagulation.   Essential hypertension Continue blood pressure monitoring.   AKI (acute kidney injury) ([name]) Hyperkalemia.   Her renal function has been stable over last  24 hrs with serum cr at 0,93, K is 4,4 and serum bicarbonate at 23. Continue diuresis with bumetanide and follow up renal function and electrolytes in am.   Anxiety state Continue with home regimen including lorazepam.         Subjective: Patient with improvement in dyspnea but not back to her baseline   Physical Exam: Vitals:   01/06/22 0652 01/06/22 0745 01/06/22 0900 01/06/22 1159  BP: (!) 102/55 (!) 110/55 117/62 (!) 94/57  Pulse: 81 81  81  Resp:  '18 18 18  '$ Temp:  97.7 F (36.5 C)  97.7 F (36.5 C)  TempSrc:  Oral  Oral  SpO2:  97%  95%  Weight:      Height:       Neurology awake and alert ENT with mild pallor Cardiovascular with S1 and S2 present with no gallops Respiratory with bilateral, bibasilar rales at bases with no wheezing or rhonchi Abdomen with no distention  No lower extremity edema  Data Reviewed:    Family Communication: I spoke with patient's husband at the bedside, we talked in detail about patient's condition, plan of care and prognosis and all questions were addressed.   Disposition: Status is: Inpatient Remains inpatient appropriate because: heart failure   Planned Discharge Destination: Home      Author: Tawni Millers, MD 01/06/2022 1:07 PM  For on call review [email or web address].

## 2022-01-06 NOTE — Progress Notes (Signed)
Mobility Specialist - Progress Note   01/06/22 1145  Mobility  Activity Ambulated with assistance in hallway  Level of Assistance Standby assist, set-up cues, supervision of patient - no hands on  Assistive Device None  Distance Ambulated (ft) 520 ft  Activity Response Tolerated well  $Mobility charge 1 Mobility   Pt was received in bed and agreeable to mobility. Pt c/o general weakness and not feeling well overall. Pt was returned to bed with all needs met.  Larey Seat

## 2022-01-07 DIAGNOSIS — I509 Heart failure, unspecified: Secondary | ICD-10-CM

## 2022-01-07 DIAGNOSIS — N179 Acute kidney failure, unspecified: Secondary | ICD-10-CM | POA: Diagnosis not present

## 2022-01-07 DIAGNOSIS — I5043 Acute on chronic combined systolic (congestive) and diastolic (congestive) heart failure: Secondary | ICD-10-CM | POA: Diagnosis not present

## 2022-01-07 DIAGNOSIS — Z95810 Presence of automatic (implantable) cardiac defibrillator: Secondary | ICD-10-CM | POA: Diagnosis not present

## 2022-01-07 DIAGNOSIS — I10 Essential (primary) hypertension: Secondary | ICD-10-CM | POA: Diagnosis not present

## 2022-01-07 LAB — BASIC METABOLIC PANEL
Anion gap: 11 (ref 5–15)
BUN: 32 mg/dL — ABNORMAL HIGH (ref 8–23)
CO2: 26 mmol/L (ref 22–32)
Calcium: 10.2 mg/dL (ref 8.9–10.3)
Chloride: 102 mmol/L (ref 98–111)
Creatinine, Ser: 1.04 mg/dL — ABNORMAL HIGH (ref 0.44–1.00)
GFR, Estimated: 56 mL/min — ABNORMAL LOW (ref >60)
Glucose, Bld: 100 mg/dL — ABNORMAL HIGH (ref 70–99)
Potassium: 4.2 mmol/L (ref 3.5–5.1)
Sodium: 139 mmol/L (ref 135–145)

## 2022-01-07 NOTE — Progress Notes (Signed)
Mobility Specialist Progress Note:   01/07/22 1506  Mobility  Activity Ambulated with assistance in hallway  Level of Assistance Independent  Assistive Device None  Distance Ambulated (ft) 550 ft  Activity Response Tolerated well  $Mobility charge 1 Mobility   Pt received EOB willing to participate in mobility. No complaints of pain. Left in bed with call bell in reach and all needs met.   [facility] Yenni Carra Mobility Specialist

## 2022-01-07 NOTE — Progress Notes (Signed)
  Progress Note   Patient: Ashley [name] [id number]:[id number] DOB: Oct 21, 1945 DOA: 01/01/2022     6 DOS: the patient was seen and examined on 01/07/2022   Brief hospital course: Ashley [name] was admitted to the hospital with the working diagnosis of heart failure.   76 yo female with the past medical history of heart failure, hypertension, asthma and partial colon resection due to perforated diverticulitis who presented with dyspnea. Reported 3 days of worsening dyspnea, despite use of nocturnal 02. On he initial physical examination her blood pressure was 125/76, HR 108, RR 24 and 02 saturation 97%, lungs with rales bilaterally with no rhonchi, abdomen not distended and trace lower extremity edema.   Patient was placed on IV bumetanide for diuresis.   Possible pneumonia, placed on antibiotic therapy.  Paroxysmal atrial fibrillation, patient declined anticoagulation  Continue diuresis with bumetanide.   Assessment and Plan: Acute on chronic combined systolic and diastolic CHF (congestive heart failure) (HCC) Echocardiogram with reduced LV systolic function EF 25 to 35%.  Urine output 2100 cc over last 24 hrs. Systolic blood pressure is 100's mmHg.   Diuresis with IV bumetanide and continue with carvedilol. Patient with multiple allergies and refusing some medications, with limited pharmacological options.   Acute hypoxemic respiratory failure due to acute cardiogenic pulmonary edema Continue with supplemental 02 per [place].  Oxymetry is 97 to 95% on 1 L/min per [place].   Pneumonia Suspected community acquired pneumonia, plan to continue antibiotic therapy with ceftriaxone. She has completed 5 doses.   Paroxysmal atrial fibrillation (HCC) Continue close telemetry monitoring, patient has declined anticoagulation.   Essential hypertension Continue blood pressure monitoring., keep MAP 65 or greater.  Continue with carvedilol and IV bumetanide.   AKI (acute kidney injury) ([place]) Hyperkalemia.    Stable renal function with serum cr at 1,0, K is 4,2 and serum bicarbonate at 26. Continue close monitoring of renal function and electrolytes.    Anxiety state Continue with home regimen including lorazepam.         Subjective: Patient with improvement in edema, dyspnea and abdominal distention, this am is out of bed to the chair.   Physical Exam: Vitals:   01/07/22 0428 01/07/22 0711 01/07/22 1015 01/07/22 1131  BP: 104/63 (!) 103/57 110/68 114/65  Pulse: 80 83 95 85  Resp: '17 17 20 18  '$ Temp: 98.2 F (36.8 C) 98 F (36.7 C) 97.7 F (36.5 C) 97.9 F (36.6 C)  TempSrc: Oral Oral Oral Oral  SpO2: 95% 96%  95%  Weight:      Height:       Neurology awake and alert ENT with mild pallor Cardiovascular with S1 and S2 present with no gallops or rubs Respiratory with mild rales at bases with no wheezing Abdomen not distended No lower extremity edema  Data Reviewed:    Family Communication: I spoke with patient's husband  at the bedside, we talked in detail about patient's condition, plan of care and prognosis and all questions were addressed.   Disposition: Status is: Inpatient Remains inpatient appropriate because: heart failure   Planned Discharge Destination: Home possible home in 48 hrs.       Author: Tawni Millers, MD 01/07/2022 2:03 PM  For on call review [email or web address].

## 2022-01-07 NOTE — Progress Notes (Signed)
Progress Note  Patient Name: Ashley [name] Date of Encounter: 01/07/2022  Primary Cardiologist: [name], MD   Subjective  She doesn't feel that she is at her baseline. Based on her experience with her HF, thinks she needs a couple more days of IV diuretics.  Bps low normal.  -1.7L net Crt mild bump 1.04 On O2 at home, here just 1 L  Inpatient Medications    Scheduled Meds:  aspirin EC  81 mg Oral QHS   budesonide  0.5 mg Nebulization BID   bumetanide (BUMEX) IV  0.5 mg Intravenous QHS   bumetanide (BUMEX) IV  1 mg Intravenous Daily   carvedilol  3.125 mg Oral BID WC   enoxaparin (LOVENOX) injection  40 mg Subcutaneous Q24H   magnesium oxide  400 mg Oral Daily   senna  1 tablet Oral BID   sodium chloride flush  3 mL Intravenous Q12H   Continuous Infusions:  magnesium sulfate bolus IVPB     PRN Meds: acetaminophen **OR** acetaminophen, hydrocortisone, levalbuterol, LORazepam, miconazole, Muscle Rub, nystatin cream, mouth rinse, polyethylene glycol   Vital Signs    Vitals:   01/06/22 2014 01/07/22 0200 01/07/22 0428 01/07/22 0711  BP: (!) 108/59 (!) 101/59 104/63 (!) 103/57  Pulse: 95 78 80 83  Resp: '17 17 17 17  '$ Temp: 98 F (36.7 C) 98 F (36.7 C) 98.2 F (36.8 C) 98 F (36.7 C)  TempSrc: Oral Oral Oral Oral  SpO2: 96% 95% 95% 96%  Weight:  79.8 kg    Height:        Intake/Output Summary (Last 24 hours) at 01/07/2022 0910 Last data filed at 01/07/2022 9811 Gross per 24 hour  Intake 360 ml  Output 1600 ml  Net -1240 ml   Filed Weights   01/05/22 0446 01/06/22 0503 01/07/22 0200  Weight: 79.9 kg 80.3 kg 79.8 kg    Telemetry  Vpacing Personally Reviewed  Physical Exam   Gen: No distress  Neck: No JVD,  Cardiac: No rubs or gallops, no murmur, RRR  Respiratory: CTAB normal effort, normal rate GI: Soft, nontender, non-distended  ABD: mild distension, also significant scarring MS: No LE edema;  moves all extremities Integument: Skin  feels warm Neuro:  At time of evaluation, alert and oriented to person/place/time/situation  Psych: Normal affect, patient feels worse  Labs    Chemistry Recent Labs  Lab 01/05/22 0434 01/06/22 0458 01/07/22 0605  NA 140 138 139  K 3.8 4.4 4.2  CL 106 109 102  CO2 '24 23 26  '$ GLUCOSE 87 97 100*  BUN 23 29* 32*  CREATININE 1.06* 0.93 1.04*  CALCIUM 10.0 9.6 10.2  GFRNONAA 54* >60 56*  ANIONGAP '10 6 11     '$ Hematology Recent Labs  Lab 01/01/22 0453 01/01/22 0502 01/02/22 0725 01/03/22 0428  WBC 19.3*  --  10.0 11.2*  RBC 4.75  --  3.79* 3.79*  HGB 14.4 13.9 11.4* 11.4*  HCT 46.2* 41.0 36.2 34.5*  MCV 97.3  --  95.5 91.0  MCH 30.3  --  30.1 30.1  MCHC 31.2  --  31.5 33.0  RDW 13.2  --  13.2 13.0  PLT 273  --  159 193    Cardiac EnzymesNo results for input(s): "TROPONINI" in the last 168 hours. No results for input(s): "TROPIPOC" in the last 168 hours.   BNP Recent Labs  Lab 01/01/22 0453 01/05/22 0434  BNP 2,040.8* 889.1*     DDimer No results for  input(s): "DDIMER" in the last 168 hours.   Radiology    No results found.  Patient Profile     76 y.o. female NICM EF 25-30% with significant functional MR, s/p iLBBb and ICD with BiV upgrade (MDT) in 2022 who presented 8/20 with acute HF  Assessment & Plan     Heart Failure Reduced Ejection Fraction  - NYHA class II, Stage C, hypervolemic, etiology unclear, exacerbation related to stoping her torsemide - Diuretic regimen: increasing to bumex 1 mg IV AM and 0.5 mg after long discussion Lasix allergy Statin myalgias pravastatin and rosuvastatin and atorvastatin -  Replace electrolytes PRN and keep K>4 and Mg>2. - Coreg 3.125 mg PO BID; unable to titrate due to BP see 8/21 note for discussion on limitations in GDMT and her various allergies - home entresto dose held for BP - SGLT2i allergy - planned for likely outpatient His Bundle Pacing optimization (Dr. Lovena Le). Vpacing appropriate for CRT 96.9% - see  prior notes for more indepth discussion of her allergies  PAF - CHADSVASC 4 - new- patient had declined AC (see prior notes for discussion)  Will plan to continue this regimen over the weekend. Can consider transition to oral on Monday  For questions or updates, please contact [facility] Please consult [email or web address] for contact info under Cardiology/STEMI.

## 2022-01-08 ENCOUNTER — Other Ambulatory Visit: Payer: Self-pay | Admitting: Cardiovascular Disease

## 2022-01-08 DIAGNOSIS — J189 Pneumonia, unspecified organism: Secondary | ICD-10-CM

## 2022-01-08 DIAGNOSIS — Z95810 Presence of automatic (implantable) cardiac defibrillator: Secondary | ICD-10-CM | POA: Diagnosis not present

## 2022-01-08 DIAGNOSIS — I10 Essential (primary) hypertension: Secondary | ICD-10-CM | POA: Diagnosis not present

## 2022-01-08 DIAGNOSIS — I5043 Acute on chronic combined systolic (congestive) and diastolic (congestive) heart failure: Secondary | ICD-10-CM | POA: Diagnosis not present

## 2022-01-08 LAB — BASIC METABOLIC PANEL
Anion gap: 11 (ref 5–15)
BUN: 38 mg/dL — ABNORMAL HIGH (ref 8–23)
CO2: 26 mmol/L (ref 22–32)
Calcium: 10.1 mg/dL (ref 8.9–10.3)
Chloride: 102 mmol/L (ref 98–111)
Creatinine, Ser: 1.14 mg/dL — ABNORMAL HIGH (ref 0.44–1.00)
GFR, Estimated: 50 mL/min — ABNORMAL LOW (ref >60)
Glucose, Bld: 99 mg/dL (ref 70–99)
Potassium: 4.3 mmol/L (ref 3.5–5.1)
Sodium: 139 mmol/L (ref 135–145)

## 2022-01-08 NOTE — Progress Notes (Addendum)
Progress Note  Patient Name: Ashley [name] Date of Encounter: 01/08/2022  Primary Cardiologist: [name], MD   Subjective  Breathing pretty well today, very protective of her renal function  Bps low normal.  - 0.72 L net BUN/Cr starting to trend up On O2 at home, here just 1 L  Inpatient Medications    Scheduled Meds:  aspirin EC  81 mg Oral QHS   bumetanide (BUMEX) IV  0.5 mg Intravenous QHS   bumetanide (BUMEX) IV  1 mg Intravenous Daily   carvedilol  3.125 mg Oral BID WC   enoxaparin (LOVENOX) injection  40 mg Subcutaneous Q24H   magnesium oxide  400 mg Oral Daily   senna  1 tablet Oral BID   sodium chloride flush  3 mL Intravenous Q12H   Continuous Infusions:  magnesium sulfate bolus IVPB     PRN Meds: acetaminophen **OR** acetaminophen, hydrocortisone, levalbuterol, LORazepam, miconazole, Muscle Rub, nystatin cream, mouth rinse, polyethylene glycol   Vital Signs    Vitals:   01/07/22 1922 01/08/22 0050 01/08/22 0300 01/08/22 0728  BP: 101/63  (!) 104/58 117/68  Pulse: 96  78 78  Resp: '20  18 18  '$ Temp: 97.8 F (36.6 C) 98 F (36.7 C) 97.6 F (36.4 C) 97.9 F (36.6 C)  TempSrc: Oral Oral Oral Oral  SpO2: 97%  96% 96%  Weight:   80 kg   Height:        Intake/Output Summary (Last 24 hours) at 01/08/2022 0833 Last data filed at 01/08/2022 0200 Gross per 24 hour  Intake 480 ml  Output 1202 ml  Net -722 ml   Filed Weights   01/06/22 0503 01/07/22 0200 01/08/22 0300  Weight: 80.3 kg 79.8 kg 80 kg    Telemetry   SR, V pacing, Occ PVCs - Personally Reviewed  Physical Exam   GEN: No acute distress.   Neck: No JVD Cardiac: RRR, no murmur, no rubs, or gallops.  Respiratory: slightly diminished BS bases bilaterally   GI: Soft, nontender, non-distended  MS: No edema; No deformity. Neuro:  Nonfocal  Psych: Normal affect    Labs    Chemistry Recent Labs  Lab 01/06/22 0458 01/07/22 0605 01/08/22 0346  NA 138 139 139  K 4.4 4.2  4.3  CL 109 102 102  CO2 '23 26 26  '$ GLUCOSE 97 100* 99  BUN 29* 32* 38*  CREATININE 0.93 1.04* 1.14*  CALCIUM 9.6 10.2 10.1  GFRNONAA >60 56* 50*  ANIONGAP '6 11 11    '$ Magnesium  Date Value Ref Range Status  01/06/2022 2.1 1.7 - 2.4 mg/dL Final    Comment:    Performed at [facility], [place] [address] [place], [place] [address]  01/04/2022 2.1 1.7 - 2.4 mg/dL Final    Comment:    Performed at [facility] [address], [place], [place] [address]  12/03/2020 2.1 1.7 - 2.4 mg/dL Final    Comment:    Performed at [facility], [address] [place], [place] [address]  07/15/2011 2.1 1.5 - 2.5 mg/dL Final    Hematology Recent Labs  Lab 01/02/22 0725 01/03/22 0428  WBC 10.0 11.2*  RBC 3.79* 3.79*  HGB 11.4* 11.4*  HCT 36.2 34.5*  MCV 95.5 91.0  MCH 30.1 30.1  MCHC 31.5 33.0  RDW 13.2 13.0  PLT 159 193    Cardiac Enzymes High Sensitivity Troponin:   Recent Labs  Lab 01/01/22 0453 01/01/22 1037  TROPONINIHS 16 63*      BNP Recent Labs  Lab 01/05/22 0434  BNP 889.1*     DDimer No results for input(s): "DDIMER" in the last 168 hours.   Radiology    No results found.  Patient Profile     76 y.o. female NICM EF 25-30% with significant functional MR, s/p iLBBB and ICD with BiV upgrade (MDT) in 2022 who presented 8/20 with acute HF  Assessment & Plan     Heart Failure Reduced Ejection Fraction  - NYHA class II, Stage C, hypervolemic, etiology unclear, exacerbation related to stoping her torsemide - Diuretic regimen: at this time, will hold Bumex with increase in BUN/Cr - Will start po Rx in am (d/w Dr Harl Bowie) - not on Statin due to myalgias with pravastatin and rosuvastatin and atorvastatin -  Replace electrolytes PRN and keep K>4 and Mg>2. - Coreg 3.125 mg PO BID; tolerating this but unable to titrate due to BP see 8/21 note for discussion on limitations in GDMT and her various allergies - home entresto dose held for BP - SGLT2i  allergy - planned for likely outpatient His Bundle Pacing optimization (Dr. Lovena Le). Vpacing appropriate for CRT 96.9% - see prior notes for more indepth discussion of her allergies  PAF - CHADSVASC 4 - new- patient had declined AC (see prior notes for discussion)  Will plan to continue this regimen over the weekend. Can consider transition to oral today or Monday  For questions or updates, please contact [facility] Please consult [email or web address] for contact info under Cardiology/STEMI.  Rosaria Ferries, PA-C 01/08/2022 9:12 AM

## 2022-01-08 NOTE — Progress Notes (Signed)
Mobility Specialist Progress Note:   01/08/22 1501  Mobility  Activity Refused mobility   Will follow-up as time allows.   [facility] Jensyn Cambria Mobility Specialist

## 2022-01-08 NOTE — Progress Notes (Signed)
  Progress Note   Patient: Ashley [name] EAV:[id number] DOB: 10-11-1945 DOA: 01/01/2022     7 DOS: the patient was seen and examined on 01/08/2022   Brief hospital course: Mrs. Haffey was admitted to the hospital with the working diagnosis of heart failure.   76 yo female with the past medical history of heart failure, hypertension, asthma and partial colon resection due to perforated diverticulitis who presented with dyspnea. Reported 3 days of worsening dyspnea, despite use of nocturnal 02. On he initial physical examination her blood pressure was 125/76, HR 108, RR 24 and 02 saturation 97%, lungs with rales bilaterally with no rhonchi, abdomen not distended and trace lower extremity edema.   Patient was placed on IV bumetanide for diuresis.   Possible pneumonia, placed on antibiotic therapy.  Paroxysmal atrial fibrillation, patient declined anticoagulation  Continue diuresis with bumetanide.   Assessment and Plan: Acute on chronic combined systolic and diastolic CHF (congestive heart failure) (HCC) Echocardiogram with reduced LV systolic function EF 25 to 35%.  Urine output 2100 cc over last 24 hrs. Systolic blood pressure is 94 to 108 mmHg.   Continue with carvedilol. Transition to torsemide in am.  Patient with multiple allergies and refusing some medications, with limited pharmacological options.   Acute hypoxemic respiratory failure due to acute cardiogenic pulmonary edema Respiratory failure has resolved, her 02 saturation on room air is 96%.  Pneumonia Suspected community acquired pneumonia (present on admission),  Antibiotic therapy with ceftriaxone. She has completed 5 doses.   Paroxysmal atrial fibrillation (HCC) Continue close telemetry monitoring, patient has declined anticoagulation.   Essential hypertension Continue blood pressure monitoring., keep MAP 65 or greater.  Continue with carvedilol Diuresis with torsemide starting in am.   AKI (acute kidney  injury) ([place]) Hyperkalemia.   Renal function with serum cr at 1,14 with K at 4,3 and serum bicarbonate at 26, Plan to transition to oral torsemide in 24 hrs Follow up renal function in am.   Anxiety state Continue with home regimen including lorazepam.         Subjective: Patient with no dyspnea, no chest pain, improved mobility   Physical Exam: Vitals:   01/07/22 1922 01/08/22 0050 01/08/22 0300 01/08/22 0728  BP: 101/63  (!) 104/58 117/68  Pulse: 96  78 78  Resp: '20  18 18  '$ Temp: 97.8 F (36.6 C) 98 F (36.7 C) 97.6 F (36.4 C) 97.9 F (36.6 C)  TempSrc: Oral Oral Oral Oral  SpO2: 97%  96% 96%  Weight:   80 kg   Height:       Neurology awake and alert ENT with mild pallor Cardiovascular with S1 and S2 present and rhythmic with no gallops, rubs or murmurs Respiratory with no rales or wheezing Abdomen not distended No lower extremity edema  Data Reviewed:    Family Communication: I spoke with patient's husband at the bedside, we talked in detail about patient's condition, plan of care and prognosis and all questions were addressed.]\  Disposition: Status is: Inpatient   Planned Discharge Destination: Home      Author: Tawni Millers, MD 01/08/2022 11:34 AM  For on call review [email or web address].

## 2022-01-08 NOTE — Plan of Care (Signed)
  Problem: Nutrition: Goal: Adequate nutrition will be maintained Outcome: Completed/Met

## 2022-01-09 DIAGNOSIS — J189 Pneumonia, unspecified organism: Secondary | ICD-10-CM | POA: Diagnosis not present

## 2022-01-09 DIAGNOSIS — I5043 Acute on chronic combined systolic (congestive) and diastolic (congestive) heart failure: Secondary | ICD-10-CM | POA: Diagnosis not present

## 2022-01-09 DIAGNOSIS — I10 Essential (primary) hypertension: Secondary | ICD-10-CM | POA: Diagnosis not present

## 2022-01-09 DIAGNOSIS — I480 Paroxysmal atrial fibrillation: Secondary | ICD-10-CM | POA: Diagnosis not present

## 2022-01-09 LAB — BASIC METABOLIC PANEL
Anion gap: 9 (ref 5–15)
BUN: 31 mg/dL — ABNORMAL HIGH (ref 8–23)
CO2: 27 mmol/L (ref 22–32)
Calcium: 10.1 mg/dL (ref 8.9–10.3)
Chloride: 104 mmol/L (ref 98–111)
Creatinine, Ser: 1.03 mg/dL — ABNORMAL HIGH (ref 0.44–1.00)
GFR, Estimated: 56 mL/min — ABNORMAL LOW (ref >60)
Glucose, Bld: 95 mg/dL (ref 70–99)
Potassium: 4.2 mmol/L (ref 3.5–5.1)
Sodium: 140 mmol/L (ref 135–145)

## 2022-01-09 MED ORDER — SPIRONOLACTONE 25 MG PO TABS: 25.0000 mg | ORAL_TABLET | Freq: Every day | ORAL | 1 refills | Status: DC

## 2022-01-09 MED ORDER — TORSEMIDE 20 MG PO TABS
10.0000 mg | ORAL_TABLET | Freq: Every day | ORAL | Status: DC
  Administered 2022-01-09: 10 mg via ORAL
  Filled 2022-01-09: qty 1

## 2022-01-09 MED ORDER — POTASSIUM CHLORIDE CRYS ER 10 MEQ PO TBCR
10.0000 meq | EXTENDED_RELEASE_TABLET | Freq: Once | ORAL | Status: AC
  Administered 2022-01-09: 10 meq via ORAL
  Filled 2022-01-09: qty 1

## 2022-01-09 MED ORDER — POTASSIUM CHLORIDE ER 10 MEQ PO TBCR
10.0000 meq | EXTENDED_RELEASE_TABLET | Freq: Every day | ORAL | 0 refills | Status: DC
Start: 2022-01-09 — End: 2022-01-14

## 2022-01-09 MED ORDER — TORSEMIDE 10 MG PO TABS
10.0000 mg | ORAL_TABLET | Freq: Every day | ORAL | 0 refills | Status: DC
Start: 2022-01-10 — End: 2022-01-14

## 2022-01-09 NOTE — Discharge Summary (Addendum)
Physician Discharge Summary   Patient: Ashley [name] MRN: [id number] DOB: 10/10/1945  Admit date:     01/01/2022  Discharge date: 01/09/22  Discharge Physician: Ashley [name]   PCP: Ashley Boyden, MD   Recommendations at discharge:   Patient will continue diuresis with torsemide 10 mg daily Continue K supplementation daily Hold on Entresto due to risk of hypotension Patient has declined anticoagulation for atrial fibrillation.  Follow with EP for his bundle pacing optim  Discharge Diagnoses: Active Problems:   Acute on chronic combined systolic and diastolic CHF (congestive heart failure) (HCC)   Pneumonia   Paroxysmal atrial fibrillation (HCC)   Essential hypertension   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   AKI (acute kidney injury) (HCC)   Anxiety state  Resolved Problems:   * No resolved hospital problems. [facility] Course: [name] was admitted to the hospital with the working diagnosis of heart failure.   76 yo female with the past medical history of heart failure, hypertension, asthma and partial colon resection due to perforated diverticulitis who presented with dyspnea. Reported 3 days of worsening dyspnea, despite use of nocturnal 02. On he initial physical examination her blood pressure was 125/76, HR 108, RR 24 and 02 saturation 97%, lungs with rales bilaterally with no rhonchi, abdomen not distended and trace lower extremity edema.   pH 7,16, Pc02 50, Pa02 88, bicarbonate 20, 02 saturation 94%.  Na 130, K 5,5 CL 017, bicarbonate 11, glucose 249, bun 22 cr 1.21 BNP 2,040 High sensitive troponin 16 and 63  Wbc 19,3 hgb 14,4 plt 273   Chest radiograph with bilateral interstitial infiltrates, bilateral hilar vascular congestion and cephalization of the vasculature. Pace make defibrillator in place with one atrial and two ventricular lead.   EKG 134 bpm, left axis deviation, left bundle branch block with normal  qtc, sinus rhythm with poor R R wave progression, with no significant ST segment or T wave changes.   Patient was placed on IV bumetanide for diuresis.   Possible pneumonia, placed on antibiotic therapy, for 5 days.  Paroxysmal atrial fibrillation, patient declined anticoagulation  Her volume status has improved and patient will follow up as outpatient with primary care and cardiology. Continue holding on Entresto due to risk of hypotension.   Assessment and Plan: Acute on chronic combined systolic and diastolic CHF (congestive heart failure) (HCC) Echocardiogram with reduced LV systolic function EF 25 to 35%. Global hypokinesis, internal cavity with moderate dilatation, RV systolic function preserved. Moderate MR.   Systolic blood pressure is 97 to 108 mmHg.  Patient received IV loop diuretic therapy with bumetanide, negative fluid balance was achieved, - 6.114 ml, with significant improvement in her symptoms.   Plan to continue heart failure management with carvedilol and diuresis with torsemide. Resume spironolactone in 24 hrs. Continue holding Entresto until follow up as outpatient.   Patient with multiple allergies and refusing some medications, with limited pharmacological options.  Allergy to SGLT 2 inh  Acute hypoxemic and hypercapnic respiratory failure due to acute cardiogenic pulmonary edema Respiratory failure has resolved, her 02 saturation on room air is 96%.  Pneumonia Suspected community acquired pneumonia (present on admission),  Antibiotic therapy with ceftriaxone. She has completed 5 doses.   Paroxysmal atrial fibrillation (HCC) Heart rate has been well controlled with B blockade, she has declined anticoagulation. Explained risk of CVA Plan to follow up as outpatient.   Essential hypertension Blood pressure remained low with systolic 90 to 100 mmHg Plan to  continue with carvedilol and diuretic therapy to prevent congestion. Continue to hold on entresto.    Dyslipidemia, continue with statin therapy.   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 Follow with EP as outpatient. Planned for likely his bundle pacing optim.   AKI (acute kidney injury) (HCC) Hyperkalemia.   Renal function with serum cr at 1,14 with K at 4,3 and serum bicarbonate at 26, Plan to transition to oral torsemide in 24 hrs Follow up renal function in am.   Anxiety state Continue with home regimen including lorazepam.          Consultants: cardiology  Procedures performed: none   Disposition: Home Diet recommendation:  Cardiac diet DISCHARGE MEDICATION: Allergies as of 01/09/2022       Reactions   Chlorhexidine Other (See Comments)   blisters   Codeine Other (See Comments)   Confusion and dizziness   Cyclobenzaprine Other (See Comments)   Adverse reaction - back and throat pain, dizziness, exhaustion   Cymbalta [duloxetine Hcl] Other (See Comments)   "body spasms and made me feel weird in my head"    Diflucan [fluconazole] Nausea And Vomiting   Dilaudid [hydromorphone Hcl] Other (See Comments)   Feels like she is burning internally    Influenza Vac Split [influenza Virus Vaccine] Other (See Comments)   "allergic to concentrated eggs that are in vaccine"   Lasix [furosemide] Other (See Comments)   Severe hypotension on lasix 40 PO (can handle 20 mg oral dosing) 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    Latex Nausea Only, Rash, Other (See Comments)   Pain and infection   Lipitor [atorvastatin] Other (See Comments)   Severe muscle cramps to generic atorvastatin.  Able to take brand lipitor.   Morphine And Related Other (See Comments)   Internally feels like she is on fire    Pravastatin Other (See Comments)   myalgias   Prevacid [lansoprazole] Other (See Comments)   Worsened GI side effects   Prevnar [pneumococcal 13-val Conj Vacc] Other (See Comments)   Egg allergy Bad reaction after shot   Rosuvastatin Other (See Comments)   Was not  effective controlling lipids   Simvastatin Other (See Comments)   Leg cramps   Tape Other (See Comments), Rash   All adhesives  - Blisters, itching and burning blistering   Farxiga [dapagliflozin] Other (See Comments)   Unknown reaction    Metaxalone Other (See Comments)   Unknown reaction    Tramadol    Leg cramps   Antihistamines, Chlorpheniramine-type Other (See Comments)   Alters vision   Betadine [povidone Iodine] Rash   Per pt.    Onion Diarrhea, Other (See Comments)   GI distress, flared IBS   Other Hives, Rash   NOTE: pt is able to take cephalosporins without reaction   Penicillins Other (See Comments)   CHILDHOOD ALLEGY/REACTION: "told 50 years ago I couldn't take it; might be immune to it", able to take amoxicillin Has patient had a PCN reaction causing immediate rash, facial/tongue/throat swelling, SOB or lightheadedness with hypotension: Unknown Has patient had a PCN reaction causing severe rash involving mucus membranes or skin necrosis: UnknowN Has patient had a PCN reaction that required hospitalization: /Unknown Has patient had a PCN reaction occurring within the last 10 years: Unknown If a   Red Dye Nausea Only, Swelling   Sertraline Other (See Comments)   Overly sedating   Sulfasalazine Other (See Comments)   "makes my whole body smell like sulfa & makes me sick just smelling it"  Medication List     STOP taking these medications    benzonatate 200 MG capsule Commonly known as: TESSALON   Entresto 49-51 MG Generic drug: sacubitril-valsartan       TAKE these medications    acetaminophen 500 MG tablet Commonly known as: TYLENOL Take 500 mg by mouth daily as needed for moderate pain or headache (PAIN).   aspirin EC 81 MG tablet Take 81 mg by mouth at bedtime.   beclomethasone 80 MCG/ACT inhaler Commonly known as: QVAR Inhale 2 puffs into the lungs 2 (two) times daily.   BIOFREEZE ROLL-ON EX Apply 1 Application topically as needed  (For shoulder pain).   Biotin 10 MG Caps Take 1 tablet by mouth daily.   carvedilol 6.25 MG tablet Commonly known as: COREG TAKE 0.5 TABLETS (3.125 MG TOTAL) BY MOUTH 2 (TWO) TIMES DAILY WITH A MEAL.   COLLAGEN PO Take by mouth daily.   CoQ-10 100 MG Caps Take 100 mg by mouth daily.   ezetimibe 10 MG tablet Commonly known as: ZETIA TAKE 1 TABLET BY MOUTH EVERY DAY   guaiFENesin 600 MG 12 hr tablet Commonly known as: MUCINEX Take 600 mg by mouth 2 (two) times daily.   levalbuterol 45 MCG/ACT inhaler Commonly known as: Xopenex HFA Inhale 2 puffs into the lungs every 6 (six) hours as needed for wheezing or shortness of breath.   magnesium 30 MG tablet Take 30 mg by mouth daily.   miconazole 2 % powder Commonly known as: MICOTIN Apply 1 application topically as needed (yeast).   nystatin cream Commonly known as: MYCOSTATIN Apply 1 application. topically as needed for dry skin (yeast infection).   OXYGEN Inhale 2 L into the lungs at bedtime as needed (Shortness of breath).   potassium chloride 10 MEQ tablet Commonly known as: KLOR-CON Take 1 tablet (10 mEq total) by mouth daily. What changed: when to take this   pyridOXINE 100 MG tablet Commonly known as: VITAMIN B6 Take 100 mg by mouth daily.   RED YEAST RICE PO Take 1 capsule by mouth in the morning and at bedtime.   spironolactone 25 MG tablet Commonly known as: ALDACTONE Take 1 tablet (25 mg total) by mouth daily. Start taking on 01/10/22 Start taking on: January 10, 2022 What changed: additional instructions   torsemide 10 MG tablet Commonly known as: DEMADEX Take 1 tablet (10 mg total) by mouth daily. Start taking on: January 10, 2022 What changed:  how much to take how to take this when to take this additional instructions   vitamin C 1000 MG tablet Take 1,000 mg by mouth See admin instructions. Take 1000 mg daily, may take an additional 1000 mg as needed for cold symptoms   Vitamin D3 50 MCG  (2000 UT) capsule Take 1 capsule (2,000 Units total) by mouth daily.   vitamin E 180 MG (400 UNITS) capsule Take 400 Units by mouth daily.        Follow-up Information     [facility] Follow up on 01/18/2022.   Specialty: Cardiology Why: [facility] 10:30 am Entrance C, Free Valet parking Contact information: [address] [id number] Wilhemina Bonito [place] [address] [phone]               Discharge Exam: Filed Weights   01/07/22 0200 01/08/22 0300 01/09/22 0300  Weight: 79.8 kg 80 kg 80.4 kg   BP 100/64   Pulse 79   Temp 97.7 F (36.5  C) (Oral)   Resp 18   Ht 5\' 5"  (1.651 m)   Wt 80.4 kg   SpO2 97%   BMI 29.49 kg/m   Patient is feeling well, no dyspnea or chest pain, edema has resolved  Neurology awake and alert ENT with no pallor Cardiovascular with S1 and S2 present with no gallops Respiratory with no rales or wheezing Abdomen not distended No lower extremity edema   Condition at discharge: stable  The results of significant diagnostics from this hospitalization (including imaging, microbiology, ancillary and laboratory) are listed below for reference.   Imaging Studies: DG Chest Port 1 View  Result Date: 01/01/2022 CLINICAL DATA:  Shortness of breath. EXAM: PORTABLE CHEST 1 VIEW COMPARISON:  09/12/2021 FINDINGS: 0456 hours. Cardiopericardial silhouette is at upper limits of normal for size. Permanent pacemaker/AICD noted. Diffuse interstitial opacity is progressive in the interval suggesting edema. Increased prominence of the right hilum in taking into account the diffuse interstitial opacity seen on the current study. No substantial pleural effusion. Curvilinear density in the parahilar right lung is new in the interval and indeterminate, potentially external to the patient. External to the patient. Bones are diffusely demineralized. Telemetry leads overlie the chest. IMPRESSION:  Interval progression of diffuse interstitial opacity suggesting pulmonary edema. Fullness in the right hilum and infrahilar region is mildly progressive in the interval, potentially related to vascular congestion and superimposed edema. Follow-up dedicated PA and lateral chest x-ray recommended to further evaluate when the patient is able. Electronically Signed   By: Kennith Center M.D.   On: 01/01/2022 05:06    Microbiology: Results for orders placed or performed during the hospital encounter of 01/01/22  Culture, blood (Routine X 2) w Reflex to ID Panel     Status: None   Collection Time: 01/01/22 10:38 AM   Specimen: BLOOD RIGHT ARM  Result Value Ref Range Status   Specimen Description BLOOD RIGHT ARM  Final   Special Requests   Final    BOTTLES DRAWN AEROBIC AND ANAEROBIC Blood Culture adequate volume   Culture   Final    NO GROWTH 5 DAYS Performed at [facility], [address] [place], [place] [address]    Report Status 01/06/2022 FINAL  Final   *Note: Due to a large number of results and/or encounters for the requested time period, some results have not been displayed. A complete set of results can be found in Results Review.    Labs: CBC: Recent Labs  Lab 01/03/22 0428  WBC 11.2*  HGB 11.4*  HCT 34.5*  MCV 91.0  PLT 193   Basic Metabolic Panel: Recent Labs  Lab 01/04/22 0447 01/05/22 0434 01/06/22 0458 01/07/22 0605 01/08/22 0346 01/09/22 0641  NA 138 140 138 139 139 140  K 4.0 3.8 4.4 4.2 4.3 4.2  CL 104 106 109 102 102 104  CO2 23 24 23 26 26 27   GLUCOSE 90 87 97 100* 99 95  BUN 25* 23 29* 32* 38* 31*  CREATININE 0.98 1.06* 0.93 1.04* 1.14* 1.03*  CALCIUM 9.8 10.0 9.6 10.2 10.1 10.1  MG 2.1  --  2.1  --   --   --    Liver Function Tests: No results for input(s): "AST", "ALT", "ALKPHOS", "BILITOT", "PROT", "ALBUMIN" in the last 168 hours. CBG: No results for input(s): "GLUCAP" in the last 168 hours.  Discharge time spent: greater than 30  minutes.  Signed: Coralie Keens, MD [facility] 01/09/2022

## 2022-01-09 NOTE — Assessment & Plan Note (Signed)
Follow with EP as outpatient. Planned for likely his bundle pacing optim.

## 2022-01-09 NOTE — Progress Notes (Signed)
Rounding Note    Patient Name: Ashley [name] Date of Encounter: 01/09/2022  [place] Cardiologist: [name], MD   Subjective   Patient was seen and examined at her bedside. Net output -1046m. She was lying in bed when I arrived.  Inpatient Medications    Scheduled Meds:  aspirin EC  81 mg Oral QHS   carvedilol  3.125 mg Oral BID WC   enoxaparin (LOVENOX) injection  40 mg Subcutaneous Q24H   magnesium oxide  400 mg Oral Daily   senna  1 tablet Oral BID   sodium chloride flush  3 mL Intravenous Q12H   Continuous Infusions:  magnesium sulfate bolus IVPB     PRN Meds: acetaminophen **OR** acetaminophen, hydrocortisone, levalbuterol, LORazepam, miconazole, Muscle Rub, nystatin cream, mouth rinse, polyethylene glycol   Vital Signs    Vitals:   01/08/22 1558 01/08/22 2002 01/09/22 0300 01/09/22 0606  BP: 98/61 (!) 100/54  (!) 104/55  Pulse: 91 83  82  Resp: '18 18  18  '$ Temp: 97.9 F (36.6 C) 98.4 F (36.9 C)  98 F (36.7 C)  TempSrc: Oral Oral  Oral  SpO2: 97% 97%    Weight:   80.4 kg   Height:        Intake/Output Summary (Last 24 hours) at 01/09/2022 0827 Last data filed at 01/09/2022 0600 Gross per 24 hour  Intake 440 ml  Output 1451 ml  Net -1011 ml      01/09/2022    3:00 AM 01/08/2022    3:00 AM 01/07/2022    2:00 AM  Last 3 Weights  Weight (lbs) 177 lb 3.2 oz 176 lb 4.8 oz 176 lb  Weight (kg) 80.377 kg 79.969 kg 79.833 kg      Telemetry    Apaced rhythm - Personally Reviewed  ECG    None today - Personally Reviewed  Physical Exam   GEN: No acute distress.   Neck: No JVD Cardiac: RRR, no murmurs, rubs, or gallops.  Respiratory: Clear to auscultation bilaterally. GI: Soft, nontender, non-distended  MS: No edema; No deformity. Neuro:  Nonfocal  Psych: Normal affect   Labs    High Sensitivity Troponin:   Recent Labs  Lab 01/01/22 0453 01/01/22 1037  TROPONINIHS 16 63*     Chemistry Recent Labs  Lab  01/04/22 0447 01/05/22 0434 01/06/22 0458 01/07/22 0605 01/08/22 0346  NA 138   < > 138 139 139  K 4.0   < > 4.4 4.2 4.3  CL 104   < > 109 102 102  CO2 23   < > '23 26 26  '$ GLUCOSE 90   < > 97 100* 99  BUN 25*   < > 29* 32* 38*  CREATININE 0.98   < > 0.93 1.04* 1.14*  CALCIUM 9.8   < > 9.6 10.2 10.1  MG 2.1  --  2.1  --   --   GFRNONAA 60*   < > >60 56* 50*  ANIONGAP 11   < > '6 11 11   '$ < > = values in this interval not displayed.    Lipids No results for input(s): "CHOL", "TRIG", "HDL", "LABVLDL", "LDLCALC", "CHOLHDL" in the last 168 hours.  Hematology Recent Labs  Lab 01/03/22 0428  WBC 11.2*  RBC 3.79*  HGB 11.4*  HCT 34.5*  MCV 91.0  MCH 30.1  MCHC 33.0  RDW 13.0  PLT 193   Thyroid No results for input(s): "TSH", "FREET4" in the last  168 hours.  BNP Recent Labs  Lab 01/05/22 0434  BNP 889.1*    DDimer No results for input(s): "DDIMER" in the last 168 hours.   Radiology    No results found.  Cardiac Studies   TTE 09/15/2021 IMPRESSIONS   1. Left ventricular ejection fraction, by estimation, is 25 to 30%. The  left ventricle has severely decreased function. The left ventricle  demonstrates global hypokinesis. The left ventricular internal cavity size  was moderately dilated. Left  ventricular diastolic parameters are indeterminate.   2. Right ventricular systolic function is normal. The right ventricular  size is normal. Tricuspid regurgitation signal is inadequate for assessing  PA pressure.   3. Left atrial size was mildly dilated.   4. The mitral valve is normal in structure. Moderate mitral valve  regurgitation. No evidence of mitral stenosis. Moderate mitral annular  calcification.   5. The aortic valve was not well visualized. Aortic valve regurgitation  is trivial. No aortic stenosis is present.   6. The inferior vena cava is normal in size with greater than 50%  respiratory variability, suggesting right atrial pressure of 3 mmHg.    Comparison(s): LVEF 30$.   FINDINGS   Left Ventricle: Left ventricular ejection fraction, by estimation, is 25  to 30%. The left ventricle has severely decreased function. The left  ventricle demonstrates global hypokinesis. Definity contrast agent was  given IV to delineate the left  ventricular endocardial borders. The left ventricular internal cavity size  was moderately dilated. There is no left ventricular hypertrophy. Left  ventricular diastolic parameters are indeterminate.   Right Ventricle: The right ventricular size is normal. No increase in  right ventricular wall thickness. Right ventricular systolic function is  normal. Tricuspid regurgitation signal is inadequate for assessing PA  pressure.   Left Atrium: Left atrial size was mildly dilated.   Right Atrium: Right atrial size was normal in size.   Pericardium: There is no evidence of pericardial effusion.   Mitral Valve: The mitral valve is normal in structure. Moderate mitral  annular calcification. Moderate mitral valve regurgitation. No evidence of  mitral valve stenosis.   Tricuspid Valve: The tricuspid valve is normal in structure. Tricuspid  valve regurgitation is mild . No evidence of tricuspid stenosis.   Aortic Valve: The aortic valve was not well visualized. Aortic valve  regurgitation is trivial. No aortic stenosis is present. Aortic valve mean  gradient measures 5.0 mmHg. Aortic valve peak gradient measures 7.7 mmHg.  Aortic valve area, by VTI measures  2.65 cm.   Pulmonic Valve: The pulmonic valve was normal in structure. Pulmonic valve  regurgitation is not visualized. No evidence of pulmonic stenosis.   Aorta: The aortic root is normal in size and structure.   Venous: The inferior vena cava is normal in size with greater than 50%  respiratory variability, suggesting right atrial pressure of 3 mmHg.   IAS/Shunts: No atrial level shunt detected by color flow Doppler.    Patient Profile      76 y.o. female NICM EF 25-30% with significant functional MR, s/p iLBBB and ICD with BiV upgrade (MDT) in 2022 who presented 8/20 with acute HF.  Assessment & Plan    Heart Failure with Reduced Ejection Fraction: NYHA class II, Stage C, hypervolemic, etiology unclear, exacerbation related to stoping her diuretics.  Clinically appears to be improving Will restart her home torsemide today - '10mg'$  daily with potassium supplement.  Am labs pending, Keep K>4 and Mg>2.  Continue Coreg 3.125 mg  PO BID; tolerating this but unable to titrate due to BP see 8/21 note for discussion on limitations in GDMT and her various allergies. home entresto dose held for BP SGLT2i allergy  S/p BiV with ICD - planned for likely outpatient His Bundle Pacing optim see prior notes for more indepth discussion of her allergies  HLN - not on Statin due to myalgias with pravastatin and rosuvastatin and atorvastatin  PAF -  CHADSVASC 4,  new- patient had declined AC, has been documented in previous notes.        For questions or updates, please contact [place] Please consult [email or web address] for contact info under        Signed, Berniece Salines, DO  01/09/2022, 8:27 AM

## 2022-01-09 NOTE — Progress Notes (Signed)
   01/09/22 0944  Mobility  Activity Refused mobility   Mobility Specialist Progress Note  Pt refused mobility d/t not feeling good. Will f/u as able.    Lucious Groves Mobility Specialist

## 2022-01-09 NOTE — Progress Notes (Signed)
Pt has orders to be discharged. Discharge instructions given and pt has no additional questions at this time. Medication regimen reviewed and pt educated. Pt verbalized understanding and has no additional questions. Telemetry box removed. IV removed and site in good condition. Pt stable and waiting for transportation. 

## 2022-01-10 ENCOUNTER — Telehealth: Payer: Self-pay

## 2022-01-10 NOTE — Telephone Encounter (Signed)
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 Doing ok  Any questions or concerns? No  Items Reviewed: Did the pt receive and understand the discharge instructions provided? Yes  Medications obtained and verified? Yes  Other? No  Any new allergies since your discharge? No  Dietary orders reviewed? Yes Do you have support at home? Yes   Home Care and Equipment/Supplies: Were home health services ordered? no If so, what is the name of the agency? na  Has the agency set up a time to come to the patient's home? not applicable Were any new equipment or medical supplies ordered?  No What is the name of the medical supply agency? na Were you able to get the supplies/equipment? not applicable Do you have any questions related to the use of the equipment or supplies? No  Functional Questionnaire: (I = Independent and D = Dependent) ADLs: I  Bathing/Dressing- I  Meal Prep- I  Eating- I  Maintaining continence- I  Transferring/Ambulation- I  Managing Meds- I  Follow up appointments reviewed:  PCP Hospital f/u appt confirmed? Yes  Scheduled to see Dr Danise Mina on 01-13-22 @ 330pm. [facility] f/u appt confirmed? Yes  Scheduled to see Dr [name] on 01-24-22 @ 2pm. Are transportation arrangements needed? No  If their condition worsens, is the pt aware to call PCP or go to the Emergency Dept.? Yes Was the patient provided with contact information for the PCP's office or ED? Yes Was to pt encouraged to call back with questions or concerns? Yes

## 2022-01-11 NOTE — Progress Notes (Deleted)
Electrophysiology Office Note Date: 01/11/2022  ID:  Ashley [name], DOB 01/06/1946, MRN [id number]  PCP: [name], MD Primary Cardiologist: [name], MD Electrophysiologist: Cristopher Peru, MD   CC: Routine ICD follow-up  Ashley [name] is a 76 y.o. female seen today for Cristopher Peru, MD for post hospital follow up.    Admitted 8/20 - 8/28 with CHF and PNA. Noted to have different morphology of pacing and was recommended to follow up as an outpatient for CRT optimization (HIS and RV lead)  Since discharge from hospital the patient reports doing ***.  she denies chest pain, palpitations, dyspnea, PND, orthopnea, nausea, vomiting, dizziness, syncope, edema, weight gain, or early satiety. {He/she (caps):30048} has not had ICD shocks.   Device History: MDT single chamber ICD implanted 07/14/11 > upgrade to CRT device though with addition of an RA and HIS lead 11/04/2018  Past Medical History:  Diagnosis Date   Abdominal aortic atherosclerosis ([place]) 11/2015   by CT   Allergy    Anemia    Anxiety    Automatic implantable cardioverter-defibrillator in situ    a. 2012 s/p MDT single lead ICD; b. 10/2018 BiV upgrade-->MDT ser # [id number].   Bronchiectasis    a. possibly mild, treat URIs aggressively   Cancer ([place])    basal cell, arms & neck   Chronic sinusitis    COPD (chronic obstructive pulmonary disease) ([place])    COVID-19 12/06/2020   Depression    Dyspnea    Essential hypertension    Pt denies   Family history of adverse reaction to anesthesia    Mother - PONV   Fibrocystic breast disease    Fibromyalgia    GERD (gastroesophageal reflux disease)    H/O multiple allergies 10/10/2013   Hemorrhoids    HFrEF (heart failure with reduced ejection fraction) ([place])    a. 06/2011 cMRI: EF 34%, no LGE; b. 01/2016 Echo: EF 35-40%; c. 07/2016 Echo: EF 40-45%; d. 02/2020 Echo: EF 20-25%, glob HK. GrI DD. Mildly red RV fxn. Mildly dil LA. Triv MR.   History of  diverticulitis of colon    History of pneumonia 2012   Insomnia    Irritable bowel syndrome    Laryngeal nodule    Dr. Thomasena Edis   Migraine without aura, without mention of intractable migraine without mention of status migrainosus    Mitral regurgitation    Monoclonal gammopathy    Neuropathy    feet and hands   Nonischemic cardiomyopathy ([place])    a. 2012 s/p single lead ICD; b. 06/2011 cMRI: EF 34%, no LGE; b. 01/2016 Echo: EF 35-40%; c. 07/2016 Echo: EF 40-45%; d. 10/2018 Cath: nl cors. EF 20-25%; e. 10/2018 s/p BiV MDT ICD upgrade; f. 02/2020 Echo: EF 20-25%, glob HK. GrI DD.   Osteoarthritis    knees, back, hands, low back,    Osteoporosis 01/2013   T-2.7 spine (01/2016)   PONV (postoperative nausea and vomiting)    sigmoid diverticulitis with perforation, abscess and fistula 09/30/2013   Sleep apnea 2010   borderline, inconclusive- /w [place]    Past Surgical History:  Procedure Laterality Date   ABDOMINAL HYSTERECTOMY  1987   w/BSO   ANAL RECTAL MANOMETRY N/A 02/02/2016   Procedure: ANO RECTAL MANOMETRY;  Surgeon: Mauri Pole, MD;  Location: [facility];  Service: Endoscopy;  Laterality: N/A;   APPENDECTOMY     AUGMENTATION MAMMAPLASTY     BIV UPGRADE N/A 11/04/2018   Procedure: BIV ICD  UPGRADE;  Surgeon: Evans Lance, MD;  Location: [facility];  Service: Cardiovascular;  Laterality: N/A;   breast cystectomies     due to FCBD   BREAST MASS EXCISION  01/2010   Left-fibrocystic change w/intraductal papilloma, no malignancy   carotid US  32/9924   2-68% LICA, normal RICA   CATARACT EXTRACTION W/PHACO Right 03/08/2021   Procedure: CATARACT EXTRACTION PHACO AND INTRAOCULAR LENS PLACEMENT ([place]) RIGHT;  Surgeon: Birder Robson, MD;  Location: [facility];  Service: Ophthalmology;  Laterality: Right;  13.01 1:15.7   [place] Bunionectomy  11/04/08   Right Great Toe (Dr. Beola Cord)   COLONOSCOPY  01/2018   1 small benign polyp, diverticulosis, healthy  anastomosis [name] @ [facility])   COLOSTOMY N/A 10/02/2013   DESCENDING COLOSTOMY;  Surgeon: Imogene Burn. [place], MD   COLOSTOMY REVERSAL N/A 02/10/2014   [name], MD   EMG/MCV  10/16/01   + mild carpal tunnel   ESOPHAGOGASTRODUODENOSCOPY  09/2014   small HH, irregular Z line, fundic gland polyps with mild gastritis Olevia Perches)   Flex laryngoscopy  06/11/06   ([place]) nml   IMPLANTABLE CARDIOVERTER DEFIBRILLATOR IMPLANT N/A 07/14/2011   MDT single chamber ICD   KNEE ARTHROSCOPY W/ ORIF     Left; "meniscus tear"   LASIK     left eye   MANDIBLE SURGERY     "got about 6 pins in the bottom of my jaw"   NCS  11/2014   L ulnar neuropathy, mild B median nerve entrapment ([place])   OSTEOTOMY     Left foot   PARTIAL COLECTOMY N/A 10/02/2013   PARTIAL COLECTOMY;  Surgeon: Imogene Burn. Tsuei, MD   RIGHT/LEFT HEART CATH AND CORONARY ANGIOGRAPHY N/A 10/16/2018   Procedure: RIGHT/LEFT HEART CATH AND CORONARY ANGIOGRAPHY;  Surgeon: [place] Hampshire, MD;  Location: [facility];  Service: Cardiovascular;  Laterality: N/A;   sinus surgery     x3 with balloon    TOE AMPUTATION     "toe beside baby toe on left foot; got gangrene from corn"   TONSILLECTOMY AND ADENOIDECTOMY      Current Outpatient Medications  Medication Sig Dispense Refill   acetaminophen (TYLENOL) 500 MG tablet Take 500 mg by mouth daily as needed for moderate pain or headache (PAIN).      Ascorbic Acid (VITAMIN C) 1000 MG tablet Take 1,000 mg by mouth See admin instructions. Take 1000 mg daily, may take an additional 1000 mg as needed for cold symptoms (Patient not taking: Reported on 01/10/2022)     aspirin EC 81 MG tablet Take 81 mg by mouth at bedtime.      beclomethasone (QVAR) 80 MCG/ACT inhaler Inhale 2 puffs into the lungs 2 (two) times daily. 1 each 5   Biotin 10 MG CAPS Take 1 tablet by mouth daily.     carvedilol (COREG) 6.25 MG tablet TAKE 0.5 TABLETS (3.125 MG TOTAL) BY MOUTH 2 (TWO) TIMES DAILY WITH A MEAL. 90 tablet 3    Cholecalciferol (VITAMIN D3) 50 MCG (2000 UT) capsule Take 1 capsule (2,000 Units total) by mouth daily. (Patient not taking: Reported on 01/10/2022)     Coenzyme Q10 (COQ-10) 100 MG CAPS Take 100 mg by mouth daily. (Patient not taking: Reported on 01/10/2022)     COLLAGEN PO Take by mouth daily. (Patient not taking: Reported on 01/10/2022)     ezetimibe (ZETIA) 10 MG tablet TAKE 1 TABLET BY MOUTH EVERY DAY (Patient not taking: Reported on 01/10/2022) 90  tablet 3   guaiFENesin (MUCINEX) 600 MG 12 hr tablet Take 600 mg by mouth 2 (two) times daily. (Patient not taking: Reported on 01/10/2022)     levalbuterol (XOPENEX HFA) 45 MCG/ACT inhaler Inhale 2 puffs into the lungs every 6 (six) hours as needed for wheezing or shortness of breath. 15 g 3   magnesium 30 MG tablet Take 30 mg by mouth daily.     Menthol, Topical Analgesic, (BIOFREEZE ROLL-ON EX) Apply 1 Application topically as needed (For shoulder pain).     miconazole (MICOTIN) 2 % powder Apply 1 application topically as needed (yeast).     nystatin cream (MYCOSTATIN) Apply 1 application. topically as needed for dry skin (yeast infection). 30 g 3   OXYGEN Inhale 2 L into the lungs at bedtime as needed (Shortness of breath).     potassium chloride (KLOR-CON) 10 MEQ tablet Take 1 tablet (10 mEq total) by mouth daily. 30 tablet 0   pyridOXINE (VITAMIN B-6) 100 MG tablet Take 100 mg by mouth daily. (Patient not taking: Reported on 01/10/2022)     Red Yeast Rice Extract (RED YEAST RICE PO) Take 1 capsule by mouth in the morning and at bedtime. (Patient not taking: Reported on 01/10/2022)     spironolactone (ALDACTONE) 25 MG tablet Take 1 tablet (25 mg total) by mouth daily. Start taking on 01/10/22 90 tablet 1   torsemide (DEMADEX) 10 MG tablet Take 1 tablet (10 mg total) by mouth daily. 30 tablet 0   vitamin E 400 UNIT capsule Take 400 Units by mouth daily. (Patient not taking: Reported on 01/10/2022)     No current facility-administered medications for  this visit.    Allergies:   Chlorhexidine; Codeine; Cyclobenzaprine; Cymbalta [duloxetine hcl]; Diflucan [fluconazole]; Dilaudid [hydromorphone hcl]; Influenza vac split [influenza virus vaccine]; Lasix [furosemide]; Latex; Lipitor [atorvastatin]; Morphine and related; Pravastatin; Prevacid [lansoprazole]; Prevnar [pneumococcal 13-val conj vacc]; Rosuvastatin; Simvastatin; Tape; Farxiga [dapagliflozin]; Metaxalone; Tramadol; Antihistamines, chlorpheniramine-type; Betadine [povidone iodine]; Onion; Other; Penicillins; Red dye; Sertraline; and Sulfasalazine   Social History: Social History   Socioeconomic History   Marital status: Married    Spouse name: Not on file   Number of children: 1   Years of education: Not on file   Highest education level: Not on file  Occupational History   Occupation: Oncologist   Occupation: Aeronautical engineer  Tobacco Use   Smoking status: Never   Smokeless tobacco: Never   Tobacco comments:    Lived with smokers x 23 yrs, smoked herself "for a week"  Vaping Use   Vaping Use: Never used  Substance and Sexual Activity   Alcohol use: No    Alcohol/week: 0.0 standard drinks of alcohol   Drug use: No   Sexual activity: Not Currently  Other Topics Concern   Not on file  Social History Narrative   Lives with husband Herbie [place]   1 adopted child   Social Determinants of Health   Financial Resource Strain: Medium Risk (05/24/2021)   Overall Financial Resource Strain (CARDIA)    Difficulty of Paying Living Expenses: Somewhat hard  Food Insecurity: No Food Insecurity (12/01/2019)   Hunger Vital Sign    Worried About Running Out of Food in the Last Year: Never true    [place] in the Last Year: Never true  Transportation Needs: No Transportation Needs (12/01/2019)   PRAPARE - Hydrologist (Medical): No    Lack of Transportation (Non-Medical):  No  Physical Activity: Insufficiently Active  (12/01/2019)   Exercise Vital Sign    Days of Exercise per Week: 3 days    Minutes of Exercise per Session: 30 min  Stress: Stress Concern Present (12/01/2019)   [place]    Feeling of Stress : Rather much  Social Connections: Not on file  Intimate Partner Violence: Not At Risk (12/01/2019)   Humiliation, Afraid, Rape, and Kick questionnaire    Fear of Current or Ex-Partner: No    Emotionally Abused: No    Physically Abused: No    Sexually Abused: No    Family History: Family History  Problem Relation Age of Onset   Lung cancer Father        + smoker   Colon polyps Father    Dementia Mother    Osteoporosis Mother        Lumbar spine   Stroke Mother        x 5 @ 30 YOA   Arthritis Mother        hands   Emphysema Mother    Alcohol abuse Paternal Grandfather    Stroke Paternal Grandfather        ?   Heart disease Paternal Grandfather    Hypertension Maternal Grandfather        ?   Heart disease Paternal Grandmother    Colon cancer Neg Hx    Esophageal cancer Neg Hx    Gallbladder disease Neg Hx     Review of Systems: All other systems reviewed and are otherwise negative except as noted above.   Physical Exam: There were no vitals filed for this visit.   GEN- The patient is well appearing, alert and oriented x 3 today.   HEENT: normocephalic, atraumatic; sclera clear, conjunctiva pink; hearing intact; oropharynx clear; neck supple, no JVP Lymph- no cervical lymphadenopathy Lungs- Clear to ausculation bilaterally, normal work of breathing.  No wheezes, rales, rhonchi Heart- Regular rate and rhythm, no murmurs, rubs or gallops, PMI not laterally displaced GI- soft, non-tender, non-distended, bowel sounds present, no hepatosplenomegaly Extremities- no clubbing or cyanosis. No edema; DP/PT/radial pulses 2+ bilaterally MS- no significant deformity or atrophy Skin- warm and dry, no rash or lesion; ICD  pocket well healed Psych- euthymic mood, full affect Neuro- strength and sensation are intact  ICD interrogation- reviewed in detail today,  See PACEART report  EKG:  EKG is ordered today. Personal review of EKG ordered {Blank single:19197::"today","***"} shows ***  Recent Labs: 02/07/2021: Pro B Natriuretic peptide (BNP) 21.0; TSH 0.79 06/30/2021: ALT 32 01/03/2022: Hemoglobin 11.4; Platelets 193 01/05/2022: B Natriuretic Peptide 889.1 01/06/2022: Magnesium 2.1 01/09/2022: BUN 31; Creatinine, Ser 1.03; Potassium 4.2; Sodium 140   Wt Readings from Last 3 Encounters:  01/09/22 177 lb 3.2 oz (80.4 kg)  10/14/21 185 lb 6 oz (84.1 kg)  10/03/21 182 lb 4 oz (82.7 kg)     Other studies Reviewed: Additional studies/ records that were reviewed today include: Previous EP office notes.   Assessment and Plan:  1.  Chronic systolic dysfunction s/p {Blank single:19197::"Medtronic","St. Jude","[employer]","Biotronik"} {Blank single:19197::"***","single chamber ICD","dual chamber ICD","CRT-D","S-ICD"}  euvolemic today Stable on an appropriate medical regimen Normal ICD function See Pace Art report No changes today   Current medicines are reviewed at length with the patient today.   =  Labs/ tests ordered today include: *** No orders of the defined types were placed in this encounter.    Disposition:   Follow  up with {Blank single:19197::"Dr. Allred","Dr. Arlan Organ. Klein","Dr. Camnitz","Dr. Lambert","EP APP"} in {Blank single:19197::"2 weeks","4 weeks","3 months","6 months","12 months","as usual post gen change"}    Signed, Annamaria Helling  01/11/2022 11:48 AM  [facility] [address] [place] [address] [phone] (office) [phone] (fax)

## 2022-01-12 ENCOUNTER — Encounter: Payer: Medicare Other | Admitting: Student

## 2022-01-12 DIAGNOSIS — I420 Dilated cardiomyopathy: Secondary | ICD-10-CM

## 2022-01-12 DIAGNOSIS — I5022 Chronic systolic (congestive) heart failure: Secondary | ICD-10-CM

## 2022-01-12 DIAGNOSIS — Z95810 Presence of automatic (implantable) cardiac defibrillator: Secondary | ICD-10-CM

## 2022-01-12 NOTE — Progress Notes (Signed)
    Electrophysiology CRT AV optimization   Date: 01/13/2022  ID:  Ashley [name], DOB 02-11-1946, MRN [id number]  PCP: [name], MD Primary Cardiologist: [name], MD Electrophysiologist: Cristopher Peru, MD   CC: Heart failure despite LV/HIS lead placement  Medtronic ICD initially implanted 07/2011, upgraded 11/04/2018 to CRT-D for LBBB (HIS) History of appropriate therapy: No History of AAD therapy: No  LV lead History: Model: 3830 Location: HIS/RV -> LBB Threshold 0.375 V @ 0.4 ms Vector : LV tip to LV ring Revisions/CXR: Post op only Diaphragmatic Stim: Not noted (HIS LEAD) VV timing: Performed today as below Prior optimzation/changes: Previously Non-adaptive CRT, S-AV 100 ms, LV->RV 20 ms  Echocardiogram: Pre-device implant: 10/14/2018 LVEF 15-20% Post-device implant: Most recent 09/16/2021 LVEF 25-30%  EKG: Pre-device implant: 10/15/2018 NSR 78 bpm, QRS 152 ms, PR 148 ms Post-device implant: 02/06/2019 NSR 77 bpm, QRS 138 ms, PR 138. Positive Lead 1, negative V1  ICD interrogation: CRT pacing: 96.9% AF: 0% PVC burden: ---% Thoracic impedence: Stable Activity Level: 2 hrs daily at baseline HR excursion: Stable 70-90s See attached PaceArt report for full details.   EKG:  EKG is ordered today.  Rhythm strip demonstrated non-selective HIS bundle capture from 5.0V to LOC at 1.5V  Baseline EKG shows BIV paced at 79 bpm, QRS 150 ms, LBBB pattern - LV-> RV 40 ms, PAV 130 SAV 120  Other EKGs   - ALL EKGs had LBBB pattern QRS > 150 ms, small initial upward deflection but overall down in V1, up in lead 1 LV(HIS) only -   79 bpm, QRS 152 ms RV only -   79 bpm, QRS 154 ms LV = RV -  80 bpm, QRS 150 ms LV -> RV 3m- 82 bpm, QRS 154 ms  LV>RV 424m PAV/SAV 100 ms -  QRS 162 ms LV only PAV/SAV 100 ms -  QRS 170 ms  Assessment and Plan:  1.  Chronic systolic heart failure Patient reports stable to improved status post CRT implant Recent admission noted for  HF.  Previously had AV optimization in 2020. No significant changes with symptoms.   There were no alternative programming that produced a more narrow QRS or flipped V1. No changes amde.   Disposition:   FU with Dr. TaLovena Len 6 months.   SiJacalyn LefevrePA-C  01/13/2022 10:41 AM  [facility] [address] [phone]) [phone]

## 2022-01-13 ENCOUNTER — Encounter: Payer: Self-pay | Admitting: Family Medicine

## 2022-01-13 ENCOUNTER — Ambulatory Visit (INDEPENDENT_AMBULATORY_CARE_PROVIDER_SITE_OTHER): Payer: Medicare Other | Admitting: Family Medicine

## 2022-01-13 ENCOUNTER — Ambulatory Visit: Payer: Medicare Other | Attending: Student | Admitting: Student

## 2022-01-13 VITALS — BP 108/62 | HR 81 | Ht 65.0 in | Wt 175.0 lb

## 2022-01-13 VITALS — BP 118/68 | HR 84 | Temp 97.6°F | Ht 65.0 in | Wt 178.4 lb

## 2022-01-13 DIAGNOSIS — N289 Disorder of kidney and ureter, unspecified: Secondary | ICD-10-CM | POA: Diagnosis not present

## 2022-01-13 DIAGNOSIS — I5022 Chronic systolic (congestive) heart failure: Secondary | ICD-10-CM | POA: Diagnosis not present

## 2022-01-13 DIAGNOSIS — N1831 Chronic kidney disease, stage 3a: Secondary | ICD-10-CM | POA: Diagnosis not present

## 2022-01-13 DIAGNOSIS — I480 Paroxysmal atrial fibrillation: Secondary | ICD-10-CM | POA: Diagnosis not present

## 2022-01-13 DIAGNOSIS — R682 Dry mouth, unspecified: Secondary | ICD-10-CM

## 2022-01-13 DIAGNOSIS — N182 Chronic kidney disease, stage 2 (mild): Secondary | ICD-10-CM | POA: Diagnosis not present

## 2022-01-13 DIAGNOSIS — I5043 Acute on chronic combined systolic (congestive) and diastolic (congestive) heart failure: Secondary | ICD-10-CM | POA: Diagnosis not present

## 2022-01-13 DIAGNOSIS — J189 Pneumonia, unspecified organism: Secondary | ICD-10-CM | POA: Diagnosis not present

## 2022-01-13 DIAGNOSIS — I420 Dilated cardiomyopathy: Secondary | ICD-10-CM | POA: Diagnosis not present

## 2022-01-13 LAB — CUP PACEART INCLINIC DEVICE CHECK
Date Time Interrogation Session: 20230901103224
Implantable Lead Implant Date: 20130301
Implantable Lead Implant Date: 20200622
Implantable Lead Implant Date: 20200622
Implantable Lead Location: 753859
Implantable Lead Location: 753860
Implantable Lead Location: 753860
Implantable Lead Model: 3830
Implantable Lead Model: 5076
Implantable Lead Model: 6935
Implantable Pulse Generator Implant Date: 20200622

## 2022-01-13 NOTE — Patient Instructions (Addendum)
Labs today. We will recheck magnesium levels as well.  Return in 3 months for medicare wellness visit  Good to see you today.

## 2022-01-13 NOTE — Patient Instructions (Signed)
Medication Instructions:  Your physician recommends that you continue on your current medications as directed. Please refer to the Current Medication list given to you today.  *If you need a refill on your cardiac medications before your next appointment, please call your pharmacy*   Lab Work: None  If you have labs (blood work) drawn today and your tests are completely normal, you will receive your results only by: [place] (if you have MyChart) OR A paper copy in the mail If you have any lab test that is abnormal or we need to change your treatment, we will call you to review the results.   Testing/Procedures: None  Follow-Up: At [facility], you and your health needs are our priority.  As part of our continuing mission to provide you with exceptional heart care, we have created designated Provider Care Teams.  These Care Teams include your primary Cardiologist (physician) and Advanced Practice Providers (APPs -  Physician Assistants and Nurse Practitioners) who all work together to provide you with the care you need, when you need it.  We recommend signing up for the patient portal called "MyChart".  Sign up information is provided on this After Visit Summary.  MyChart is used to connect with patients for Virtual Visits (Telemedicine).  Patients are able to view lab/test results, encounter notes, upcoming appointments, etc.  Non-urgent messages can be sent to your provider as well.   To learn more about what you can do with MyChart, go to [email or web address].    Your next appointment:   6 month(s)  The format for your next appointment:   In Person  Provider:   Cristopher Peru, MD    Important Information About Sugar

## 2022-01-13 NOTE — Progress Notes (Unsigned)
Patient ID: LU PARADISE, female    DOB: [birth year], 76 y.o.   MRN: [id number]  This visit was conducted in person.  BP 118/68   Pulse 84   Temp 97.6 F (36.4 C) (Temporal)   Ht '5\' 5"'$  (1.651 m)   Wt 178 lb 6 oz (80.9 kg)   SpO2 96%   BMI 29.68 kg/m    CC: hosp f/u visit  Subjective:   HPI: Ashley [name] is a 76 y.o. female presenting on 01/13/2022 for Hospitalization Follow-up (Admitted on 01/01/22 at [facility], dx acute pulmonary edema. )   Recent hospitalization for acute on chronic combined CHF and pneumonia - treated with IV bumex for diuresis and 5d antibiotic ceftriaxone. Net negative of 6 liters, lost 10 lbs.  Hospital records reviewed. Med rec performed.  Hospital echocardiogram showed reduced EF 25-35%, global hypokinesis, moderate MR.  Entresto and BB held due to risk of hypotension. Carvedilol has been restarted.  She declined anticoagulation for afib.   Intermittently using supplemental oxygen at 2L.   Notes years of dry mouth at night time progressively worsening over the past 6 months, wakes up and causes choking. Feels saliva glands "not working". During the day she does feel dry mouth as well. Feels globus sensation mid esophagus that affects swallowing. She has had difficulty with left eye tear duct getting occluded, sees eye doctor for this on eye drops.  Difficulty tolerating artificial saliva.  She sleeps on 45 degree angle - on couch - longstanding for years. No PNdyspnea.   Home health not set up.  Other follow up appointments scheduled: saw EP today - stable period s/p CRT implant. Sees Dr [name] 01/24/2022.  ______________________________________________________________________ Hospital admission: 01/01/2022 Hospital discharge: 01/09/2022 TCM f/u phone call: performed 01/10/2022  Recommendations at discharge:   Patient will continue diuresis with torsemide 10 mg daily Continue K supplementation daily Hold on Entresto due to risk of  hypotension Patient has declined anticoagulation for atrial fibrillation.  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 Interim medical history since our last visit reviewed. Allergies and medications reviewed and updated. Outpatient Medications Prior to Visit  Medication Sig Dispense Refill   aspirin EC 81 MG tablet Take 81 mg by mouth at bedtime.      carvedilol (COREG) 6.25 MG tablet TAKE 0.5 TABLETS (3.125 MG TOTAL) BY MOUTH 2 (TWO) TIMES DAILY WITH A MEAL. 90 tablet 3   OXYGEN Inhale 2 L into the lungs at bedtime as needed (Shortness of breath).     spironolactone (ALDACTONE) 25 MG tablet Take 1 tablet (25 mg total) by mouth daily. Start taking on 01/10/22 90 tablet 1   potassium chloride (KLOR-CON) 10 MEQ tablet Take 1 tablet (10 mEq total) by mouth daily. 30 tablet 0   torsemide (DEMADEX) 10 MG tablet Take 1 tablet (10 mg total) by mouth daily. 30 tablet 0   acetaminophen (TYLENOL) 500 MG tablet Take 500 mg by mouth daily as needed for moderate pain or headache (PAIN).  (Patient not taking: Reported on 01/13/2022)     beclomethasone (QVAR) 80 MCG/ACT inhaler  Inhale 2 puffs into the lungs 2 (two) times daily. (Patient not taking: Reported on 01/13/2022) 1 each 5   Cholecalciferol (VITAMIN D3) 50 MCG (2000 UT) capsule Take 1 capsule (2,000 Units total) by mouth daily. (Patient not taking: Reported on 01/10/2022)     ezetimibe (ZETIA) 10 MG tablet TAKE 1 TABLET BY MOUTH EVERY DAY (Patient not taking: Reported on 01/10/2022) 90 tablet 3   levalbuterol (XOPENEX HFA) 45 MCG/ACT  inhaler Inhale 2 puffs into the lungs every 6 (six) hours as needed for wheezing or shortness of breath. (Patient not taking: Reported on 01/13/2022) 15 g 3   Menthol, Topical Analgesic, (BIOFREEZE ROLL-ON EX) Apply 1 Application topically as needed (For shoulder pain). (Patient not taking: Reported on 01/13/2022)     miconazole (MICOTIN) 2 % powder Apply 1 application topically as needed (yeast). (Patient not taking: Reported on 01/13/2022)     nystatin cream (MYCOSTATIN) Apply 1 application. topically as needed for dry skin (yeast infection). (Patient not taking: Reported on 01/13/2022) 30 g 3   Ascorbic Acid (VITAMIN C) 1000 MG tablet Take 1,000 mg by mouth See admin instructions. Take 1000 mg daily, may take an additional 1000 mg as needed for cold symptoms (Patient not taking: Reported on 01/10/2022)     Biotin 10 MG CAPS Take 1 tablet by mouth daily. (Patient not taking: Reported on 01/13/2022)     Coenzyme Q10 (COQ-10) 100 MG CAPS Take 100 mg by mouth daily. (Patient not taking: Reported on 01/10/2022)     COLLAGEN PO Take by mouth daily. (Patient not taking: Reported on 01/10/2022)     guaiFENesin (MUCINEX) 600 MG 12 hr tablet Take 600 mg by mouth 2 (two) times daily. (Patient not taking: Reported on 01/10/2022)     magnesium 30 MG tablet Take 30 mg by mouth daily. (Patient not taking: Reported on 01/13/2022)     pyridOXINE (VITAMIN B-6) 100 MG tablet Take 100 mg by mouth daily. (Patient not taking: Reported on 01/10/2022)     Red Yeast Rice Extract (RED YEAST RICE PO) Take 1 capsule by mouth in the morning and at bedtime. (Patient not taking: Reported on 01/10/2022)     vitamin E 400 UNIT capsule Take 400 Units by mouth daily. (Patient not taking: Reported on 01/10/2022)     No facility-administered medications prior to visit.     Per HPI unless specifically indicated in ROS section below Review of Systems  Objective:  BP 118/68   Pulse 84   Temp 97.6 F (36.4 C) (Temporal)   Ht '5\' 5"'$  (1.651 m)   Wt  178 lb 6 oz (80.9 kg)   SpO2 96%   BMI 29.68 kg/m   Wt Readings from Last 3 Encounters:  01/13/22 178 lb 6 oz (80.9 kg)  01/13/22 175 lb (79.4 kg)  01/09/22 177 lb 3.2 oz (80.4 kg)      Physical Exam Vitals and nursing note reviewed.  Constitutional:      Appearance: Normal appearance. She is not ill-appearing.     Comments: Supplemental oxygen via [place]  HENT:     Mouth/Throat:     Mouth: Mucous membranes are moist.     Pharynx: Oropharynx is clear. No oropharyngeal exudate or posterior oropharyngeal erythema.  Cardiovascular:     Rate and Rhythm: Normal rate and regular rhythm.     Pulses: Normal pulses.     Heart sounds: Normal heart sounds. No murmur heard. Pulmonary:     Effort: Pulmonary effort is normal. No  respiratory distress.     Breath sounds: Normal breath sounds. No wheezing, rhonchi or rales.  Musculoskeletal:     Right lower leg: No edema.     Left lower leg: No edema.  Skin:    General: Skin is warm and dry.     Findings: No rash.  Neurological:     Mental Status: She is alert.  Psychiatric:        Mood and Affect: Mood normal.        Behavior: Behavior normal.       Results for orders placed or performed in visit on 01/13/22  Magnesium  Result Value Ref Range   Magnesium 2.1 1.5 - 2.5 mg/dL  Renal function panel  Result Value Ref Range   Glucose, Bld 84 65 - 99 mg/dL   BUN 31 (H) 7 - 25 mg/dL   Creat 1.14 (H) 0.60 - 1.00 mg/dL   BUN/Creatinine Ratio 27 (H) 6 - 22 (calc)   Sodium 137 135 - 146 mmol/L   Potassium 4.5 3.5 - 5.3 mmol/L   Chloride 103 98 - 110 mmol/L   CO2 22 20 - 32 mmol/L   Calcium 10.2 8.6 - 10.4 mg/dL   Phosphorus 3.8 2.1 - 4.3 mg/dL   Albumin 4.4 3.6 - 5.1 g/dL   *Note: Due to a large number of results and/or encounters for the requested time period, some results have not been displayed. A complete set of results can be found in Results Review.  GFR = 46  Assessment & Plan:   Problem List Items Addressed This Visit      Acute on chronic combined systolic and diastolic CHF (congestive heart failure) ([place]) - Primary    Treated in hospital for this with IV bumex, now transitioned to oral torsemide '10mg'$  and potassium daily - she states she is taking both only QOD, along with the spironolactone '25mg'$  daily, and seems stable on current regimen. She does have cards f/u later this month. Update labs including magnesium today - will determine need for ongoing Mg supplementation.  She has also restarted using PRN supplemental oxygen.       Relevant Medications   torsemide (DEMADEX) 10 MG tablet   Renal lesion    This is now monitored by urology Dr Gloriann Loan.       Pneumonia    Possible CAP treated with ceftriaxone 5d course in hospital.       Relevant Medications   guaiFENesin (MUCINEX) 600 MG 12 hr tablet   Paroxysmal atrial fibrillation (HCC)    Continues carvedilol, declined anticoagulation in hospital.       Relevant Medications   torsemide (DEMADEX) 10 MG tablet   Dry mouth    Longstanding h/o this, endorses it is progressively worsening and may be associated with dysphagia due to globus sensation. Doesn't regularly see dentist. Reasonable to check for sjogren's (ANA, anti-ssa, anti-ssb).  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  She did have reassuring rheumatology evaluation 2017 ([facility]), has also seen rheum more recently through [place] Roosvelt Harps, MD).       Relevant Orders   ANA   Sjogren's syndrome antibods(ssa + ssb)   CKD (chronic kidney disease) stage 2, GFR 60-89 ml/min    Kidney function has fluctuated between 50s-60s over the past few years, more recently trending into 40s.  Now on torsemide '10mg'$  QOD - will continue this, update renal panel.       Relevant  Orders   Magnesium (Completed)   Renal function panel (Completed)     Meds ordered this encounter  Medications   torsemide (DEMADEX) 10 MG tablet    Sig: Take 1  tablet (10 mg total) by mouth every other day.    Dispense:  45 tablet    Refill:  1   potassium chloride (KLOR-CON) 10 MEQ tablet    Sig: Take 1 tablet (10 mEq total) by mouth every other day.    Dispense:  45 tablet    Refill:  1   guaiFENesin (MUCINEX) 600 MG 12 hr tablet    Sig: Take 1 tablet (600 mg total) by mouth 2 (two) times daily as needed for to loosen phlegm.   Orders Placed This Encounter  Procedures   ANA   Sjogren's syndrome antibods(ssa + ssb)   Magnesium   Renal function panel     Patient Instructions  Labs today. We will recheck magnesium levels as well.  Return in 3 months for medicare wellness visit  Good to see you today.   Follow up plan: Return in about 3 months (around 04/14/2022) for medicare wellness visit.  [name], MD

## 2022-01-14 DIAGNOSIS — N183 Chronic kidney disease, stage 3 unspecified: Secondary | ICD-10-CM | POA: Insufficient documentation

## 2022-01-14 DIAGNOSIS — N182 Chronic kidney disease, stage 2 (mild): Secondary | ICD-10-CM | POA: Insufficient documentation

## 2022-01-14 DIAGNOSIS — R682 Dry mouth, unspecified: Secondary | ICD-10-CM | POA: Insufficient documentation

## 2022-01-14 MED ORDER — GUAIFENESIN ER 600 MG PO TB12: 600.0000 mg | ORAL_TABLET | Freq: Two times a day (BID) | ORAL | Status: AC | PRN

## 2022-01-14 MED ORDER — POTASSIUM CHLORIDE ER 10 MEQ PO TBCR: 10.0000 meq | EXTENDED_RELEASE_TABLET | ORAL | 1 refills | Status: DC

## 2022-01-14 MED ORDER — TORSEMIDE 10 MG PO TABS: 10.0000 mg | ORAL_TABLET | ORAL | 1 refills | Status: DC

## 2022-01-14 NOTE — Assessment & Plan Note (Addendum)
Possible CAP treated with ceftriaxone 5d course in hospital.

## 2022-01-14 NOTE — Assessment & Plan Note (Addendum)
Treated in hospital for this with IV bumex, now transitioned to oral torsemide '10mg'$  and potassium daily - she states she is taking both only QOD, along with the spironolactone '25mg'$  daily, and seems stable on current regimen. She does have cards f/u later this month. Update labs including magnesium today - will determine need for ongoing Mg supplementation.  She has also restarted using PRN supplemental oxygen.

## 2022-01-14 NOTE — Assessment & Plan Note (Signed)
Kidney function has fluctuated between 50s-60s over the past few years, more recently trending into 40s.  Now on torsemide '10mg'$  QOD - will continue this, update renal panel.

## 2022-01-14 NOTE — Assessment & Plan Note (Signed)
This is now monitored by urology Dr Gloriann Loan.

## 2022-01-14 NOTE — Assessment & Plan Note (Signed)
Continues carvedilol, declined anticoagulation in hospital.

## 2022-01-14 NOTE — Assessment & Plan Note (Addendum)
Longstanding h/o this, endorses it is progressively worsening and may be associated with dysphagia due to globus sensation. Doesn't regularly see dentist. Reasonable to check for sjogren's (ANA, anti-ssa, anti-ssb).  Unable to tolerate artificial saliva (Biotene) Known IgM lambda MGUS, followed by hematologist at [facility].  She did have reassuring rheumatology evaluation 2017 ([facility]), has also seen rheum more recently through [place] Roosvelt Harps, MD).

## 2022-01-15 ENCOUNTER — Encounter: Payer: Self-pay | Admitting: Family Medicine

## 2022-01-15 LAB — RENAL FUNCTION PANEL
Albumin: 4.4 g/dL (ref 3.6–5.1)
BUN/Creatinine Ratio: 27 (calc) — ABNORMAL HIGH (ref 6–22)
BUN: 31 mg/dL — ABNORMAL HIGH (ref 7–25)
CO2: 22 mmol/L (ref 20–32)
Calcium: 10.2 mg/dL (ref 8.6–10.4)
Chloride: 103 mmol/L (ref 98–110)
Creat: 1.14 mg/dL — ABNORMAL HIGH (ref 0.60–1.00)
Glucose, Bld: 84 mg/dL (ref 65–99)
Phosphorus: 3.8 mg/dL (ref 2.1–4.3)
Potassium: 4.5 mmol/L (ref 3.5–5.3)
Sodium: 137 mmol/L (ref 135–146)

## 2022-01-15 LAB — SJOGREN'S SYNDROME ANTIBODS(SSA + SSB)
SSA (Ro) (ENA) Antibody, IgG: <1 AI
SSB (La) (ENA) Antibody, IgG: <1 AI

## 2022-01-15 LAB — ANTI-NUCLEAR AB-TITER (ANA TITER)
ANA TITER: 1:40 {titer} — ABNORMAL HIGH
ANA Titer 1: 1:40 {titer} — ABNORMAL HIGH

## 2022-01-15 LAB — MAGNESIUM: Magnesium: 2.1 mg/dL (ref 1.5–2.5)

## 2022-01-15 LAB — ANA: Anti Nuclear Antibody (ANA): POSITIVE — AB

## 2022-01-17 ENCOUNTER — Telehealth: Payer: Self-pay

## 2022-01-17 NOTE — Progress Notes (Signed)
ADVANCED HF CLINIC NOTE  Referring Physician: Dr. [name] Primary Care: [name], MD Primary Cardiologist: Dr. [name] [facility]: Dr. [name]   Reason for Visit: F/u for chronic systolic heart failure/ NICM   HPI:  Ashley [name] is a 76 year old with a history of morbid obesity, HTN, COPD, GERD , fibromyaligia, s/p partial colon resection for diverticulitis with re-connection, MGUS, LBBB and systolic HF due to NICM s/p CRT-D referred by Dr. [name] for further evaluation of her HF.  Has h/o NICM dating back to 2012 when she had PNA October 2012 ejection fraction  25-35%,  September 2013 ejection fraction 30-35%.  EF in 2019 had recovered to 60-65% but now EF is back down to 15-20%.    Admitted 6/20 with increased dyspnea and chest pain. Prior to admit she had been treated with levaquin + prednisone for bronchitis. CXR was concerning for interstitial edema. Bedside ECHO performed in the ED showed severely reduced LVEF-->15% and severe MR. Placed on IV lasix and set up for cath. In 6/20 had RHC/LHC with normal coronaries, EF 20-25%, and mildly elevated filling pressures. Renal function has been stable. Underwent upgrade to CRT-D  Last echo 10/21: EF 25%  In July, she contracted COVID and was placed on paxlovid and subsequently developed diarrhea and was admitted for subsequent dehydration and AKI. Received IVFs and meds temporarily held but able to resume HF regimen. Also w/ hypoxia. D-dimer was elevated by chest CT negative for PE. Hypoxia felt 2/2 COVID and sent home w/ home O2.   She had f/u in cardiology clinic 8/22 and echo repeated, showing LVEF 30%. RV normal.   Presents to [facility] today for routien f/u. Here w/ husband. Continues w/ NYHA Class II symptoms. Has been able to wean off home O2. Checks O2 sats at home. 96% on RA during today's visit. Her wt is fairly stable since her last visit in Feb, only up 2 lb from 184>>186 lb. BP well controlled. Reports occasional positional  dizziness and dry cough. Did not tolerate [place]. Saw PCP earlier this week on 9/26 and had labs. SCr 0.89. K 4.5. BNP also normal at 21.   Device interrogation today shows good volume status, index and impedence ok. No AT/AF. No VT/VF. 100 V pacing. Good activity level, 3-4 hr/day. Personally reviewed.    Review of systems complete and found to be negative unless listed in HPI.     Cardiac Studies  RHC/LHC 10/16/2018  LVEF 20-25. Normal coronaries RA 8 PCWP 21 CO 4.45 CI 2.35    ECHO 10/14/2018 EF 15-20%. RV normal, Severe central mitral valve regurgitation.     1. The left ventricle has a 2D calculated ejection fraction 15-20%%. The cavity size was mildly dilated. Left ventricular diastolic Doppler parameters are indeterminate.  2. The right ventricle has normal systolic function. The cavity was normal. There is no increase in right ventricular wall thickness.  3. Left atrial size was moderately dilated.  4. The mitral valve is rheumatic. Moderate thickening of the mitral valve leaflet. There is mild to moderate mitral annular calcification present. Mitral valve regurgitation is severe by color flow Doppler. The MR jet is centrally-directed.  Echo 8/22: EF 30%, RV normal    Past Medical History:  Diagnosis Date   Abdominal aortic atherosclerosis ([place]) 11/2015   by CT   Allergy    Anemia    Anxiety    Automatic implantable cardioverter-defibrillator in situ    a. 2012 s/p MDT single lead ICD; b. 10/2018  BiV upgrade-->MDT ser # [id number] H.   Bronchiectasis    a. possibly mild, treat URIs aggressively   Cancer ([place])    basal cell, arms & neck   Chronic sinusitis    COPD (chronic obstructive pulmonary disease) ([place])    COVID-19 12/06/2020   Depression    Dyspnea    Essential hypertension    Pt denies   Family history of adverse reaction to anesthesia    Mother - PONV   Fibrocystic breast disease    Fibromyalgia    GERD (gastroesophageal reflux disease)    H/O multiple  allergies 10/10/2013   Hemorrhoids    HFrEF (heart failure with reduced ejection fraction) ([place])    a. 06/2011 cMRI: EF 34%, no LGE; b. 01/2016 Echo: EF 35-40%; c. 07/2016 Echo: EF 40-45%; d. 02/2020 Echo: EF 20-25%, glob HK. GrI DD. Mildly red RV fxn. Mildly dil LA. Triv MR.   History of diverticulitis of colon    History of pneumonia 2012   Insomnia    Irritable bowel syndrome    Laryngeal nodule    Dr. Thomasena Edis   Migraine without aura, without mention of intractable migraine without mention of status migrainosus    Mitral regurgitation    Monoclonal gammopathy    Neuropathy    feet and hands   Nonischemic cardiomyopathy ([place])    a. 2012 s/p single lead ICD; b. 06/2011 cMRI: EF 34%, no LGE; b. 01/2016 Echo: EF 35-40%; c. 07/2016 Echo: EF 40-45%; d. 10/2018 Cath: nl cors. EF 20-25%; e. 10/2018 s/p BiV MDT ICD upgrade; f. 02/2020 Echo: EF 20-25%, glob HK. GrI DD.   Osteoarthritis    knees, back, hands, low back,    Osteoporosis 01/2013   T-2.7 spine (01/2016)   PONV (postoperative nausea and vomiting)    sigmoid diverticulitis with perforation, abscess and fistula 09/30/2013   Sleep apnea 2010   borderline, inconclusive- /w [place]     Current Outpatient Medications  Medication Sig Dispense Refill   acetaminophen (TYLENOL) 500 MG tablet Take 500 mg by mouth daily as needed for moderate pain or headache (PAIN).  (Patient not taking: Reported on 01/13/2022)     aspirin EC 81 MG tablet Take 81 mg by mouth at bedtime.      beclomethasone (QVAR) 80 MCG/ACT inhaler Inhale 2 puffs into the lungs 2 (two) times daily. (Patient not taking: Reported on 01/13/2022) 1 each 5   carvedilol (COREG) 6.25 MG tablet TAKE 0.5 TABLETS (3.125 MG TOTAL) BY MOUTH 2 (TWO) TIMES DAILY WITH A MEAL. 90 tablet 3   Cholecalciferol (VITAMIN D3) 50 MCG (2000 UT) capsule Take 1 capsule (2,000 Units total) by mouth daily. (Patient not taking: Reported on 01/10/2022)     ezetimibe (ZETIA) 10 MG tablet TAKE 1 TABLET BY MOUTH EVERY  DAY (Patient not taking: Reported on 01/10/2022) 90 tablet 3   guaiFENesin (MUCINEX) 600 MG 12 hr tablet Take 1 tablet (600 mg total) by mouth 2 (two) times daily as needed for to loosen phlegm.     levalbuterol (XOPENEX HFA) 45 MCG/ACT inhaler Inhale 2 puffs into the lungs every 6 (six) hours as needed for wheezing or shortness of breath. (Patient not taking: Reported on 01/13/2022) 15 g 3   Menthol, Topical Analgesic, (BIOFREEZE ROLL-ON EX) Apply 1 Application topically as needed (For shoulder pain). (Patient not taking: Reported on 01/13/2022)     miconazole (MICOTIN) 2 % powder Apply 1 application topically as needed (yeast). (Patient not taking: Reported on 01/13/2022)  nystatin cream (MYCOSTATIN) Apply 1 application. topically as needed for dry skin (yeast infection). (Patient not taking: Reported on 01/13/2022) 30 g 3   OXYGEN Inhale 2 L into the lungs at bedtime as needed (Shortness of breath).     potassium chloride (KLOR-CON) 10 MEQ tablet Take 1 tablet (10 mEq total) by mouth every other day. 45 tablet 1   spironolactone (ALDACTONE) 25 MG tablet Take 1 tablet (25 mg total) by mouth daily. Start taking on 01/10/22 90 tablet 1   torsemide (DEMADEX) 10 MG tablet Take 1 tablet (10 mg total) by mouth every other day. 45 tablet 1   No current facility-administered medications for this visit.    Allergies  Allergen Reactions   Chlorhexidine Other (See Comments)    blisters   Codeine Other (See Comments)    Confusion and dizziness   Cyclobenzaprine Other (See Comments)    Adverse reaction - back and throat pain, dizziness, exhaustion   Cymbalta [Duloxetine Hcl] Other (See Comments)    "body spasms and made me feel weird in my head"    Diflucan [Fluconazole] Nausea And Vomiting   Dilaudid [Hydromorphone Hcl] Other (See Comments)    Feels like she is burning internally    Influenza Vac Split [Influenza Virus Vaccine] Other (See Comments)    "allergic to concentrated eggs that are in vaccine"    Lasix [Furosemide] Other (See Comments)    Severe hypotension on lasix 40 PO (can handle 20 mg oral dosing) 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    Latex Nausea Only, Rash and Other (See Comments)    Pain and infection   Lipitor [Atorvastatin] Other (See Comments)    Severe muscle cramps to generic atorvastatin.  Able to take brand lipitor.   Morphine And Related Other (See Comments)    Internally feels like she is on fire    Pravastatin Other (See Comments)    myalgias   Prevacid [Lansoprazole] Other (See Comments)    Worsened GI side effects   Prevnar [Pneumococcal 13-Val Conj Vacc] Other (See Comments)    Egg allergy Bad reaction after shot   Rosuvastatin Other (See Comments)    Was not effective controlling lipids   Simvastatin Other (See Comments)    Leg cramps   Tape Other (See Comments) and Rash    All adhesives  - Blisters, itching and burning  blistering   Farxiga [Dapagliflozin] Other (See Comments)    Unknown reaction    Metaxalone Other (See Comments)    Unknown reaction    Tramadol     Leg cramps   Antihistamines, Chlorpheniramine-Type Other (See Comments)    Alters vision   Betadine [Povidone Iodine] Rash    Per pt.    Onion Diarrhea and Other (See Comments)    GI distress, flared IBS   Other Hives and Rash    NOTE: pt is able to take cephalosporins without reaction   Penicillins Other (See Comments)    CHILDHOOD ALLEGY/REACTION: "told 50 years ago I couldn't take it; might be immune to it", able to take amoxicillin Has patient had a PCN reaction causing immediate rash, facial/tongue/throat swelling, SOB or lightheadedness with hypotension: Unknown Has patient had a PCN reaction causing severe rash involving mucus membranes or skin necrosis: UnknowN Has patient had a PCN reaction that required hospitalization: /Unknown Has patient had a PCN reaction occurring within the last 10 years: Unknown If a   [place] Nausea Only and Swelling   Sertraline Other (See Comments)  Overly sedating   Sulfasalazine Other (See Comments)    "makes my whole body smell like sulfa & makes me sick just smelling it"      Social History   Socioeconomic History   Marital status: Married    Spouse name: Not on file   Number of children: 1   Years of education: Not on file   Highest education level: Not on file  Occupational History   Occupation: Oncologist   Occupation: Aeronautical engineer  Tobacco Use   Smoking status: Never   Smokeless tobacco: Never   Tobacco comments:    Lived with smokers x 23 yrs, smoked herself "for a week"  Vaping Use   Vaping Use: Never used  Substance and Sexual Activity   Alcohol use: No    Alcohol/week: 0.0 standard drinks of alcohol   Drug use: No   Sexual activity: Not Currently  Other Topics Concern   Not on file  Social History Narrative   Lives with husband Ashley [name]   1 adopted child   Social Determinants of Health   Financial Resource Strain: Medium Risk (05/24/2021)   Overall Financial Resource Strain (CARDIA)    Difficulty of Paying Living Expenses: Somewhat hard  Food Insecurity: No Food Insecurity (12/01/2019)   Hunger Vital Sign    Worried About Running Out of Food in the Last Year: Never true    [place] in the Last Year: Never true  Transportation Needs: No Transportation Needs (12/01/2019)   PRAPARE - Hydrologist (Medical): No    Lack of Transportation (Non-Medical): No  Physical Activity: Insufficiently Active (12/01/2019)   Exercise Vital Sign    Days of Exercise per Week: 3 days    Minutes of Exercise per Session: 30 min  Stress: Stress Concern Present (12/01/2019)   [place]    Feeling of Stress : Rather much  Social Connections: Not on file  Intimate Partner Violence: Not At Risk (12/01/2019)   Humiliation, Afraid, Rape, and Kick questionnaire    Fear of Current or  Ex-Partner: No    Emotionally Abused: No    Physically Abused: No    Sexually Abused: No      Family History  Problem Relation Age of Onset   Lung cancer Father        + smoker   Colon polyps Father    Dementia Mother    Osteoporosis Mother        Lumbar spine   Stroke Mother        x 5 @ 28 YOA   Arthritis Mother        hands   Emphysema Mother    Alcohol abuse Paternal Grandfather    Stroke Paternal Grandfather        ?   Heart disease Paternal Grandfather    Hypertension Maternal Grandfather        ?   Heart disease Paternal Grandmother    Colon cancer Neg Hx    Esophageal cancer Neg Hx    Gallbladder disease Neg Hx     There were no vitals filed for this visit.    PHYSICAL EXAM: General:  Well appearing. No respiratory difficulty HEENT: normal Neck: supple. no JVD. Carotids 2+ bilat; no bruits. No lymphadenopathy or thyromegaly appreciated. Cor: PMI nondisplaced. Regular rate & rhythm. No rubs, gallops or murmurs. Lungs: clear Abdomen: soft, nontender, nondistended. No hepatosplenomegaly. No  bruits or masses. Good bowel sounds. Extremities: no cyanosis, clubbing, rash, edema Neuro: alert & oriented x 3, cranial nerves grossly intact. moves all 4 extremities w/o difficulty. Affect pleasant.   ASSESSMENT & PLAN:  1. Chronic systolic HF - h/o NICM dating back to 2012 with fluctuating EF. EF improved to 60% in 2019 but then dropped again  - Echo 6/20 EF 20%  - LHC/RHC 6/20 with normal coronaries and mildly elevated filling pressures.  - Underwent CRT-D upgrade 6/20 but EF has not improved - Echo 10/21 EF 25% - Echo 8/22 EF 30%, RV normal  - NYHA II - Volume status ok on exam and Optivol. C/w  torsemide 10 mg MWF - She is on a very good GDMT regimen - Continue Entresto 49/51 bid. Will not increase given dizziness - Continue carvedilol 3.125 bid - Continue spiro 25 daily - Did not tolerate [place]  - Encouraged to work on increasing physical activity. We  discussed CPX testing but feel that this is not warranted yet.  - Reviewed labs checked at PCP office this week. SCr and K WNL on current regimen.   F/u in 6 months.   Rafael Bihari, FNP  4:15 PM  Patient seen and examined with the above-signed Advanced Practice Provider and/or Housestaff. I personally reviewed laboratory data, imaging studies and relevant notes. I independently examined the patient and formulated the important aspects of the plan. I have edited the note to reflect any of my changes or salient points. I have personally discussed the plan with the patient and/or family.  Overall doing ok. Still has some post-COVID symptoms but able to work in yard and do other activities without too much problem. Volume status ok. On good meds.   General:  Well appearing. No resp difficulty HEENT: normal Neck: supple. no JVD. Carotids 2+ bilat; no bruits. No lymphadenopathy or thryomegaly appreciated. Cor: PMI nondisplaced. Regular rate & rhythm. No rubs, gallops or murmurs. Lungs: clear Abdomen: obese soft, nontender, nondistended. No hepatosplenomegaly. No bruits or masses. Good bowel sounds. Extremities: no cyanosis, clubbing, rash, edema Neuro: alert & orientedx3, cranial nerves grossly intact. moves all 4 extremities w/o difficulty. Affect pleasant  Stable NYHA II-III. Volume status ok. On good meds. ICD interrogation with no AF/VT. Volume activity ok. Activity level 3 hr/day. We discussed possible CPX but given current activity level doubt it would change management much.   Rafael Bihari, FNP  4:15 PM

## 2022-01-17 NOTE — Progress Notes (Signed)
Chronic Care Management Pharmacy Assistant   Name: Ashley [name]  MRN: [id number] DOB: 07/08/1945  Reason for Encounter: CCM (Hosptial Follow Up)  Medications: Outpatient Encounter Medications as of 01/17/2022  Medication Sig   acetaminophen (TYLENOL) 500 MG tablet Take 500 mg by mouth daily as needed for moderate pain or headache (PAIN).  (Patient not taking: Reported on 01/13/2022)   aspirin EC 81 MG tablet Take 81 mg by mouth at bedtime.    beclomethasone (QVAR) 80 MCG/ACT inhaler Inhale 2 puffs into the lungs 2 (two) times daily. (Patient not taking: Reported on 01/13/2022)   carvedilol (COREG) 6.25 MG tablet TAKE 0.5 TABLETS (3.125 MG TOTAL) BY MOUTH 2 (TWO) TIMES DAILY WITH A MEAL.   Cholecalciferol (VITAMIN D3) 50 MCG (2000 UT) capsule Take 1 capsule (2,000 Units total) by mouth daily. (Patient not taking: Reported on 01/10/2022)   ezetimibe (ZETIA) 10 MG tablet TAKE 1 TABLET BY MOUTH EVERY DAY (Patient not taking: Reported on 01/10/2022)   guaiFENesin (MUCINEX) 600 MG 12 hr tablet Take 1 tablet (600 mg total) by mouth 2 (two) times daily as needed for to loosen phlegm.   levalbuterol (XOPENEX HFA) 45 MCG/ACT inhaler Inhale 2 puffs into the lungs every 6 (six) hours as needed for wheezing or shortness of breath. (Patient not taking: Reported on 01/13/2022)   Menthol, Topical Analgesic, (BIOFREEZE ROLL-ON EX) Apply 1 Application topically as needed (For shoulder pain). (Patient not taking: Reported on 01/13/2022)   miconazole (MICOTIN) 2 % powder Apply 1 application topically as needed (yeast). (Patient not taking: Reported on 01/13/2022)   nystatin cream (MYCOSTATIN) Apply 1 application. topically as needed for dry skin (yeast infection). (Patient not taking: Reported on 01/13/2022)   OXYGEN Inhale 2 L into the lungs at bedtime as needed (Shortness of breath).   potassium chloride (KLOR-CON) 10 MEQ tablet Take 1 tablet (10 mEq total) by mouth every other day.   spironolactone (ALDACTONE) 25 MG  tablet Take 1 tablet (25 mg total) by mouth daily. Start taking on 01/10/22   torsemide (DEMADEX) 10 MG tablet Take 1 tablet (10 mg total) by mouth every other day.   [DISCONTINUED] bisoprolol (ZEBETA) 5 MG tablet Take 5 mg by mouth daily.   No facility-administered encounter medications on file as of 01/17/2022.   Reviewed hospital notes for details of recent visit. Patient has been contacted by Transitions of Care team: Yes  Admitted to the hospital on 01/01/2022. Discharge date was 01/09/2022.  Discharged from [facility].   Discharge diagnosis (Principal Problem): Acute on chronic combined systolic and diastolic CHF  Patient was discharged to Home  Brief summary of hospital course: Mrs. Panning was admitted to the hospital with the working diagnosis of heart failure. 76 yo female with the past medical history of heart failure, hypertension, asthma and partial colon resection due to perforated diverticulitis who presented with dyspnea. Reported 3 days of worsening dyspnea, despite use of nocturnal 02. On he initial physical examination her blood pressure was 125/76, HR 108, RR 24 and 02 saturation 97%, lungs with rales bilaterally with no rhonchi, abdomen not distended and trace lower extremity edema. Chest radiograph with bilateral interstitial infiltrates, bilateral hilar vascular congestion and cephalization of the vasculature. Pace make defibrillator in place with one atrial and two ventricular lead. EKG 134 bpm, left axis deviation, left bundle branch block with normal qtc, sinus rhythm with poor R R wave progression, with no significant ST segment or T wave changes. Patient was placed on  IV bumetanide for diuresis. Possible pneumonia, placed on antibiotic therapy, for 5 days.  Paroxysmal atrial fibrillation, patient declined anticoagulation. Her volume status has improved and patient will follow up as outpatient with primary care and cardiology. Continue holding on Entresto due to risk  of hypotension.  New?Medications Started at [facility] Discharge:?? No new medications started  Medication Changes at Hospital Discharge: -Changed potassium chloride (KLOR-CON) 10 MEQ tablet -Changed spironolactone (ALDACTONE) 25 MG tablet -Changed torsemide (DEMADEX) 10 MG tablet  Medications Discontinued at Hospital Discharge: -Stopped benzonatate 200 MG capsule -Stopped Entresto 49-51 MG (sacubitril-valsartan)  Medications that remain the same after Hospital Discharge:??  -All other medications will remain the same.    Next CCM appt: 05/24/22  Other upcoming appts: [facility] Spec clinic appointment on 01/18/2022 Cardiology appointment on 01/24/2022  Charlene Brooke, PharmD notified and will determine if action is needed.

## 2022-01-18 ENCOUNTER — Encounter (HOSPITAL_COMMUNITY): Payer: Self-pay

## 2022-01-18 ENCOUNTER — Ambulatory Visit (HOSPITAL_COMMUNITY)
Admit: 2022-01-18 | Discharge: 2022-01-18 | Disposition: A | Discharge status: Home / Self Care | Payer: Medicare Other | Attending: Family Medicine | Admitting: Family Medicine

## 2022-01-18 VITALS — BP 110/70 | HR 80 | Wt 176.3 lb

## 2022-01-18 DIAGNOSIS — I959 Hypotension, unspecified: Secondary | ICD-10-CM | POA: Insufficient documentation

## 2022-01-18 DIAGNOSIS — K5792 Diverticulitis of intestine, part unspecified, without perforation or abscess without bleeding: Secondary | ICD-10-CM | POA: Diagnosis not present

## 2022-01-18 DIAGNOSIS — Z79899 Other long term (current) drug therapy: Secondary | ICD-10-CM | POA: Diagnosis not present

## 2022-01-18 DIAGNOSIS — I110 Hypertensive heart disease with heart failure: Secondary | ICD-10-CM | POA: Diagnosis not present

## 2022-01-18 DIAGNOSIS — Z95810 Presence of automatic (implantable) cardiac defibrillator: Secondary | ICD-10-CM | POA: Diagnosis not present

## 2022-01-18 DIAGNOSIS — I340 Nonrheumatic mitral (valve) insufficiency: Secondary | ICD-10-CM | POA: Diagnosis not present

## 2022-01-18 DIAGNOSIS — I428 Other cardiomyopathies: Secondary | ICD-10-CM | POA: Diagnosis not present

## 2022-01-18 DIAGNOSIS — Z9981 Dependence on supplemental oxygen: Secondary | ICD-10-CM | POA: Diagnosis not present

## 2022-01-18 DIAGNOSIS — R7989 Other specified abnormal findings of blood chemistry: Secondary | ICD-10-CM | POA: Diagnosis not present

## 2022-01-18 DIAGNOSIS — K219 Gastro-esophageal reflux disease without esophagitis: Secondary | ICD-10-CM | POA: Diagnosis not present

## 2022-01-18 DIAGNOSIS — Z7182 Exercise counseling: Secondary | ICD-10-CM | POA: Insufficient documentation

## 2022-01-18 DIAGNOSIS — G4730 Sleep apnea, unspecified: Secondary | ICD-10-CM | POA: Diagnosis not present

## 2022-01-18 DIAGNOSIS — F17200 Nicotine dependence, unspecified, uncomplicated: Secondary | ICD-10-CM | POA: Insufficient documentation

## 2022-01-18 DIAGNOSIS — R0902 Hypoxemia: Secondary | ICD-10-CM | POA: Diagnosis not present

## 2022-01-18 DIAGNOSIS — Z8616 Personal history of COVID-19: Secondary | ICD-10-CM | POA: Insufficient documentation

## 2022-01-18 DIAGNOSIS — Z6829 Body mass index (BMI) 29.0-29.9, adult: Secondary | ICD-10-CM | POA: Insufficient documentation

## 2022-01-18 DIAGNOSIS — J449 Chronic obstructive pulmonary disease, unspecified: Secondary | ICD-10-CM | POA: Diagnosis not present

## 2022-01-18 DIAGNOSIS — I447 Left bundle-branch block, unspecified: Secondary | ICD-10-CM | POA: Diagnosis not present

## 2022-01-18 DIAGNOSIS — I5022 Chronic systolic (congestive) heart failure: Secondary | ICD-10-CM | POA: Insufficient documentation

## 2022-01-18 LAB — BASIC METABOLIC PANEL
Anion gap: 9 (ref 5–15)
BUN: 24 mg/dL — ABNORMAL HIGH (ref 8–23)
CO2: 20 mmol/L — ABNORMAL LOW (ref 22–32)
Calcium: 9.9 mg/dL (ref 8.9–10.3)
Chloride: 109 mmol/L (ref 98–111)
Creatinine, Ser: 0.88 mg/dL (ref 0.44–1.00)
GFR, Estimated: >60 mL/min (ref >60)
Glucose, Bld: 97 mg/dL (ref 70–99)
Potassium: 4.4 mmol/L (ref 3.5–5.1)
Sodium: 138 mmol/L (ref 135–145)

## 2022-01-18 LAB — BRAIN NATRIURETIC PEPTIDE: B Natriuretic Peptide: 553.5 pg/mL — ABNORMAL HIGH (ref 0.0–100.0)

## 2022-01-18 MED ORDER — LOSARTAN POTASSIUM 25 MG PO TABS: 12.5000 mg | ORAL_TABLET | Freq: Every day | ORAL | 3 refills | Status: DC

## 2022-01-18 NOTE — Patient Instructions (Signed)
Start Losartan 12.5 mg at bedtime. Rx sent. Stay off Entresto for now. Repeat labs in 2 weeks; appointment scheduled Your physician wants you to follow-up in: 4 - 6 months with Dr. [name].  You will receive a reminder letter in the mail two months in advance. If you don't receive a letter, please call our office to schedule the follow-up appointment at [phone]

## 2022-01-23 ENCOUNTER — Encounter: Payer: Medicare Other | Admitting: Student
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
## 2022-01-24 ENCOUNTER — Ambulatory Visit: Payer: Medicare Other | Attending: Cardiovascular Disease | Admitting: Cardiovascular Disease

## 2022-01-24 ENCOUNTER — Encounter: Payer: Self-pay | Admitting: Cardiovascular Disease

## 2022-01-24 VITALS — BP 98/60 | HR 75 | Ht 65.0 in | Wt 177.4 lb

## 2022-01-24 DIAGNOSIS — Z95810 Presence of automatic (implantable) cardiac defibrillator: Secondary | ICD-10-CM

## 2022-01-24 DIAGNOSIS — R0602 Shortness of breath: Secondary | ICD-10-CM

## 2022-01-24 DIAGNOSIS — E782 Mixed hyperlipidemia: Secondary | ICD-10-CM | POA: Diagnosis not present

## 2022-01-24 DIAGNOSIS — J9611 Chronic respiratory failure with hypoxia: Secondary | ICD-10-CM | POA: Diagnosis not present

## 2022-01-24 DIAGNOSIS — I700 Atherosclerosis of aorta: Secondary | ICD-10-CM | POA: Diagnosis not present

## 2022-01-24 DIAGNOSIS — I5022 Chronic systolic (congestive) heart failure: Secondary | ICD-10-CM | POA: Diagnosis not present

## 2022-01-24 DIAGNOSIS — I420 Dilated cardiomyopathy: Secondary | ICD-10-CM

## 2022-01-24 DIAGNOSIS — I051 Rheumatic mitral insufficiency: Secondary | ICD-10-CM

## 2022-01-24 MED ORDER — TORSEMIDE 10 MG PO TABS: 10.0000 mg | ORAL_TABLET | ORAL | 1 refills | Status: DC

## 2022-01-24 NOTE — Patient Instructions (Addendum)
Medication Instructions:  No changes  Take extra torsemide 10 mg with potassium as needed for weight >174  If you need a refill on your cardiac medications before your next appointment, please call your pharmacy.   Lab work: No new labs needed  Testing/Procedures: No new testing needed  Follow-Up: At [facility], you and your health needs are our priority.  As part of our continuing mission to provide you with exceptional heart care, we have created designated Provider Care Teams.  These Care Teams include your primary Cardiologist (physician) and Advanced Practice Providers (APPs -  Physician Assistants and Nurse Practitioners) who all work together to provide you with the care you need, when you need it.  You will need a follow up appointment in 6 months  Providers on your designated Care Team:   Murray Hodgkins, NP Christell Faith, PA-C Cadence Kathlen Mody, [place]  COVID-19 Vaccine Information can be found at: [email or web address] For questions related to vaccine distribution or appointments, please email [email or web address] or call 2364188313.

## 2022-01-30 ENCOUNTER — Ambulatory Visit (HOSPITAL_COMMUNITY)
Admission: RE | Admit: 2022-01-30 | Discharge: 2022-01-30 | Disposition: A | Discharge status: Home / Self Care | Payer: Medicare Other | Source: Ambulatory Visit | Attending: Cardiology | Admitting: Cardiology

## 2022-01-30 DIAGNOSIS — R7989 Other specified abnormal findings of blood chemistry: Secondary | ICD-10-CM | POA: Insufficient documentation

## 2022-01-30 DIAGNOSIS — I5022 Chronic systolic (congestive) heart failure: Secondary | ICD-10-CM

## 2022-01-30 LAB — BASIC METABOLIC PANEL
Anion gap: 9 (ref 5–15)
BUN: 20 mg/dL (ref 8–23)
CO2: 21 mmol/L — ABNORMAL LOW (ref 22–32)
Calcium: 9.9 mg/dL (ref 8.9–10.3)
Chloride: 110 mmol/L (ref 98–111)
Creatinine, Ser: 0.99 mg/dL (ref 0.44–1.00)
GFR, Estimated: 59 mL/min — ABNORMAL LOW (ref >60)
Glucose, Bld: 116 mg/dL — ABNORMAL HIGH (ref 70–99)
Potassium: 4.2 mmol/L (ref 3.5–5.1)
Sodium: 140 mmol/L (ref 135–145)

## 2022-01-31 ENCOUNTER — Telehealth: Payer: Self-pay | Admitting: *Deleted

## 2022-01-31 NOTE — Patient Outreach (Signed)
  Care Coordination   01/31/2022 Name: Ashley [name] MRN: [id number] DOB: [birth year]   Care Coordination Outreach Attempts:  An unsuccessful telephone outreach was attempted today to offer the patient information about available care coordination services as a benefit of their health plan.   Follow Up Plan:  Additional outreach attempts will be made to offer the patient care coordination information and services.   Encounter Outcome:  No Answer  Care Coordination Interventions Activated:  Yes   Care Coordination Interventions:  No, not indicated    [facility] [phone]

## 2022-02-07 DIAGNOSIS — L570 Actinic keratosis: Secondary | ICD-10-CM | POA: Diagnosis not present

## 2022-02-07 DIAGNOSIS — L298 Other pruritus: Secondary | ICD-10-CM | POA: Diagnosis not present

## 2022-02-07 DIAGNOSIS — L821 Other seborrheic keratosis: Secondary | ICD-10-CM | POA: Diagnosis not present

## 2022-02-08 ENCOUNTER — Telehealth: Payer: Self-pay

## 2022-02-08 ENCOUNTER — Telehealth (HOSPITAL_COMMUNITY): Payer: Self-pay | Admitting: *Deleted

## 2022-02-08 ENCOUNTER — Telehealth: Payer: Self-pay | Admitting: Internal Medicine

## 2022-02-08 ENCOUNTER — Encounter (HOSPITAL_COMMUNITY): Payer: Self-pay | Admitting: *Deleted

## 2022-02-08 NOTE — Telephone Encounter (Signed)
Pt left a vm asking if she qualifies for "device that will help her EF". She said she discussed this with her provider at last appt but was told Dr.Bensimhon had to review her echo first.   Routed to [place]

## 2022-02-08 NOTE — Telephone Encounter (Signed)
Called pt no answer. Msg sent via mychart

## 2022-02-08 NOTE — Progress Notes (Signed)
Chronic Care Management Pharmacy Assistant   Name: Ashley [name]  MRN: [id number] DOB: [birth year]  Reason for Encounter: CCM (Appointment Reminder)  Recent office visits:  01/13/22 [name], MD Acute on chronic combined systolic and diastolic CHF Labs "Your ANA autoimmune test again returned mildly positive but at low levels that may not be significant. Your other autoimmune test looking for cause of dry mouth returned normal" Change: guaiFENesin (MUCINEX) 600 MG 12 hr tablet Change: potassium chloride (KLOR-CON) 10 MEQ tablet Change: torsemide (DEMADEX) 10 MG tablet FU 3 months 10/14/21 [name], MD Stage 3 CKD No med changes FU 6 months 07/12/21 [name], MD Diverticulitis Referral to Dr. [name] at [facility] No med changes 04/15/21 - [name], MD - Pt presented for AWV. Increase vitamin D3 to 2000 IU daily. LDL elevated, discussed lifestyle management. Hair loss, will update iron panel. Follow up 6 months.   Recent consult visits:  10/03/21 [name], MD (Cardiology) CHF Change: Take extra Torsemide 10 mg for weight >174. Ordered: EKG FU 6 months 01/13/22 Barrington Ellison, PA (Cardiology) Cardiomyopathy No med changes FU 6 months 07/07//23 [facility]) Cyst of finger 10/28/21 [place] [facility]) Cyst of finger 10/03/21 [name], MD (Cardiology) CHF No med changes FU 3 months 09/28/21 Rondel Baton, MD (Hematology) Cardiomyopathy No medication changes 09/21/21 Vernard Gambles, MD (Pulmonology) Change: Qvar 1 puff twice a day for 2 - 4 weeks to get the inflammation in airway down. FU 2- 3 months 09/15/21 Cadence Kathlen Mody, PA (Cardiology) Shortness of breath Stop (change) Torsemide 10 mg daily for two weeks Stop (completed) Fluorouracil 5 % Stop (completed) Triamcinolone Acetonide 55 mcg 09/05/21 Silvano Rusk, MD [facility]) Message regarding liver lesions "The abnormalities described in liver on recent CT are stable and tiny and most  likely represent cysts. Their size precludes definitive diagnosis and the stability (lack of growth) indicates a benign issue. The radiologist did not recommend any follow-up testing so that is good news." 08/30/21 CT Abdomen Pelvis 08/17/21 Silvano Rusk, MD [facility]) Diverticulitis Ordered: BUN and CT Abdomen Stop (patient) Pantoprazole 40 mg. Start: OTC Fleet enema every other day.  06/30/21 [facility] Abdominal Pain - Advised to go to ED 06/27/21 Noel Christmas (Internal Medicine) Covid 19 06/06/21 Marinus Maw, [place] (Cardiology) CHF No med changes FU 6 months 05/26/21 Marinus Maw, PA (Cardiology) NICM Ordered: CUP PACEART Device Check and EKG No med changes. FU 2 weeks.  05/19/21 - Telephone note -  Pt denied for Entresto assistance program. 04/11/21 - Cardiology - Pt presented for 3 month follow up visit, CHF, cardiomyopathy, syncope. Continue current medications. EKG ordered.  Hospital visits:  01/01/2022 ED Visit - Follow up completed on 01/17/2022  09/12/21 [place] same day Final Diagnoses: Shortness of breath, Acute on chronic systolic congestive heart failure Referral to Cardiology No medication changes at discharge   06/30/21 [place] same day Final Diagnoses: Diverticulitis No medication changes at discharge    03/08/21 - Cataract extraction surgery   Medications: Outpatient Encounter Medications as of 02/08/2022  Medication Sig   acetaminophen (TYLENOL) 500 MG tablet Take 500 mg by mouth daily as needed for moderate pain or headache (PAIN).  (Patient not taking: Reported on 01/13/2022)   aspirin EC 81 MG tablet Take 81 mg by mouth at bedtime.   beclomethasone (QVAR) 80 MCG/ACT inhaler Inhale 2 puffs into the lungs 2 (two) times daily.   carvedilol (COREG) 6.25 MG tablet TAKE  0.5 TABLETS (3.125 MG TOTAL) BY MOUTH 2 (TWO) TIMES DAILY WITH A MEAL.   Cholecalciferol (VITAMIN D3) 50 MCG (2000 UT) capsule  Take 1 capsule (2,000 Units total) by mouth daily. (Patient not taking: Reported on 01/24/2022)   ezetimibe (ZETIA) 10 MG tablet TAKE 1 TABLET BY MOUTH EVERY DAY   guaiFENesin (MUCINEX) 600 MG 12 hr tablet Take 1 tablet (600 mg total) by mouth 2 (two) times daily as needed for to loosen phlegm.   levalbuterol (XOPENEX HFA) 45 MCG/ACT inhaler Inhale 2 puffs into the lungs every 6 (six) hours as needed for wheezing or shortness of breath. (Patient not taking: Reported on 01/24/2022)   losartan (COZAAR) 25 MG tablet Take 0.5 tablets (12.5 mg total) by mouth at bedtime.   Menthol, Topical Analgesic, (BIOFREEZE ROLL-ON EX) Apply 1 Application topically as needed (For shoulder pain).   miconazole (MICOTIN) 2 % powder Apply 1 application  topically as needed (yeast).   nystatin cream (MYCOSTATIN) Apply 1 application. topically as needed for dry skin (yeast infection).   OXYGEN Inhale 2 L into the lungs at bedtime as needed (Shortness of breath).   potassium chloride (KLOR-CON) 10 MEQ tablet Take 1 tablet (10 mEq total) by mouth every other day.   spironolactone (ALDACTONE) 25 MG tablet Take 1 tablet (25 mg total) by mouth daily. Start taking on 01/10/22   torsemide (DEMADEX) 10 MG tablet Take 1 tablet (10 mg total) by mouth every other day. Take extra torsemide 10 mg with potassium as needed for weight >174   [DISCONTINUED] bisoprolol (ZEBETA) 5 MG tablet Take 5 mg by mouth daily.   No facility-administered encounter medications on file as of 02/08/2022.   Ashley [name] was contacted to remind of upcoming telephone visit with Charlene Brooke on 02/15/2022 at 9:30. Patient was reminded to have any blood glucose and blood pressure readings available for review at appointment.   Patient confirmed appointment.  Are you having any problems with your medications? No   Do you have any concerns you like to discuss with the pharmacist? No  CCM referral has been placed prior to visit?  Yes   Star  Rating Drugs: Medication:  Last Fill: Day Supply Losartan 25 mg 01/18/22 [place], CPP notified  Marijean Niemann, [place] Pharmacy Assistant [phone]

## 2022-02-08 NOTE — Telephone Encounter (Signed)
   Pt would like to ask Dr. Lovena Le if she can be a candidate to get a different pacer that has one lead to help her ejection fraction so she doesn't have to take blood thinner. She was told by Dr. [name] to ask Dr. Lovena Le about that. Also, pt would like to get prescription from Dr. Lovena Le for a small 2-3 lbs oxygen tank so she doesn't have to cary the bigger tank around

## 2022-02-10 ENCOUNTER — Telehealth: Payer: Self-pay | Admitting: *Deleted

## 2022-02-10 NOTE — Patient Outreach (Signed)
  Care Coordination   Follow Up Visit Note   02/10/2022 Name: Ashley [name] MRN: [id number] DOB: 07-01-1945  Ashley [name] is a 76 y.o. year old female who sees [name], MD for primary care. I spoke with  [name] by phone today.  What matters to the patients health and wellness today?  No health concerns voiced. RN discussed [place], RN, SW, and Pharmacist. Patient declined services.     Goals Addressed   None     SDOH assessments and interventions completed:  Yes     Care Coordination Interventions Activated:  Yes  Care Coordination Interventions:  No, not indicated   Follow up plan: No further intervention required.   Encounter Outcome:  Pt. [place] Care Management [phone]

## 2022-02-10 NOTE — Patient Outreach (Signed)
  Care Coordination   Initial Visit Note   02/10/2022 Name: YSIDRA SOPHER MRN: [id number] DOB: [birth year]  TOWANDA HORNSTEIN is a 76 y.o. year old female who sees [name], MD for primary care. I spoke with  [name] by phone today.  What matters to the patients health and wellness today?  Patient is wanting to get an inogen tank. She wants to be able to take trips, shop and not have to carry the large tanks with fear of running out.     Goals Addressed   None        Care Coordination Interventions Activated:  Yes  Care Coordination Interventions:  Yes, provided RN discussed with patient about calling [employer] and see if the insurance would cover any towards an inogen tank. RN explained that they usually don't because she has had the larger tanks and compressor only 3 yrs. They will not pay to two oxygen systems. I explained that she could check with [employer] to see what their out of pocket charge is also to be able to get local service. . She knows that to order it is $2000.   Follow up plan: No further intervention required.   Encounter Outcome:  Pt. Visit Completed

## 2022-02-14 ENCOUNTER — Ambulatory Visit (INDEPENDENT_AMBULATORY_CARE_PROVIDER_SITE_OTHER): Payer: Medicare Other

## 2022-02-14 DIAGNOSIS — I420 Dilated cardiomyopathy: Secondary | ICD-10-CM

## 2022-02-14 LAB — CUP PACEART REMOTE DEVICE CHECK
Battery Remaining Longevity: 28 mo
Battery Voltage: 2.94 V
Brady Statistic AP VP Percent: 0.16 %
Brady Statistic AP VS Percent: 0.05 %
Brady Statistic AS VP Percent: 97.5 %
Brady Statistic AS VS Percent: 2.29 %
Brady Statistic RA Percent Paced: 0.21 %
Brady Statistic RV Percent Paced: 96.47 %
Date Time Interrogation Session: 20231003100025
HighPow Impedance: 67 Ohm
Implantable Lead Implant Date: 20130301
Implantable Lead Implant Date: 20200622
Implantable Lead Implant Date: 20200622
Implantable Lead Location: 753859
Implantable Lead Location: 753860
Implantable Lead Location: 753860
Implantable Lead Model: 3830
Implantable Lead Model: 5076
Implantable Lead Model: 6935
Implantable Pulse Generator Implant Date: 20200622
Lead Channel Impedance Value: 247 Ohm
Lead Channel Impedance Value: 304 Ohm
Lead Channel Impedance Value: 342 Ohm
Lead Channel Impedance Value: 380 Ohm
Lead Channel Impedance Value: 399 Ohm
Lead Channel Impedance Value: 437 Ohm
Lead Channel Pacing Threshold Amplitude: 0.375 V
Lead Channel Pacing Threshold Amplitude: 0.625 V
Lead Channel Pacing Threshold Amplitude: 1.375 V
Lead Channel Pacing Threshold Pulse Width: 0.4 ms
Lead Channel Pacing Threshold Pulse Width: 0.4 ms
Lead Channel Pacing Threshold Pulse Width: 0.4 ms
Lead Channel Sensing Intrinsic Amplitude: 2.125 mV
Lead Channel Sensing Intrinsic Amplitude: 2.125 mV
Lead Channel Sensing Intrinsic Amplitude: 6.375 mV
Lead Channel Sensing Intrinsic Amplitude: 6.375 mV
Lead Channel Setting Pacing Amplitude: 1.5 V
Lead Channel Setting Pacing Amplitude: 2 V
Lead Channel Setting Pacing Amplitude: 2.5 V
Lead Channel Setting Pacing Pulse Width: 0.4 ms
Lead Channel Setting Pacing Pulse Width: 0.8 ms
Lead Channel Setting Sensing Sensitivity: 0.3 mV

## 2022-02-15 ENCOUNTER — Ambulatory Visit: Payer: Medicare Other | Admitting: Pharmacist

## 2022-02-15 DIAGNOSIS — Z789 Other specified health status: Secondary | ICD-10-CM

## 2022-02-15 DIAGNOSIS — J4530 Mild persistent asthma, uncomplicated: Secondary | ICD-10-CM

## 2022-02-15 DIAGNOSIS — E782 Mixed hyperlipidemia: Secondary | ICD-10-CM

## 2022-02-15 DIAGNOSIS — I5022 Chronic systolic (congestive) heart failure: Secondary | ICD-10-CM

## 2022-02-15 DIAGNOSIS — I700 Atherosclerosis of aorta: Secondary | ICD-10-CM

## 2022-02-15 DIAGNOSIS — N1831 Chronic kidney disease, stage 3a: Secondary | ICD-10-CM

## 2022-02-15 DIAGNOSIS — I480 Paroxysmal atrial fibrillation: Secondary | ICD-10-CM

## 2022-02-15 NOTE — Patient Instructions (Signed)
Visit Information  Phone number for Pharmacist: [phone]   Goals Addressed   None     Care Plan : [place]  Updates made by Ashley [name], RPH since 02/15/2022 12:00 AM     Problem: Hyperlipidemia, Atrial Fibrillation, Heart Failure, and Asthma/Chronic bronchitis   Priority: High     Long-Range Goal: Disease Management   Start Date: 05/24/2021  Expected End Date: 02/16/2023  This Visit's Progress: On track  Priority: High  Note:   Current Barriers:  Unable to maintain control of heart failure/chronic bronchitis  Pharmacist Clinical Goal(s):  Patient will adhere to plan to optimize therapeutic regimen for HF/chronic bronchitis as evidenced by report of adherence to recommended medication management changes through collaboration with PharmD and provider.   Interventions: 1:1 collaboration with Ashley Bush, MD regarding development and update of comprehensive plan of care as evidenced by provider attestation and co-signature Inter-disciplinary care team collaboration (see longitudinal plan of care) Comprehensive medication review performed; medication list updated in electronic medical record  Hyperlipidemia: (LDL goal < 100) -Not ideally controlled - LDL 108 (03/2021), pt has not tolerated several statins -Hx aortic atherosclerosis -Current treatment: Ezetimibe 10 mg daily -Appropriate, Query Effective Aspirin 81 mg daily -Appropriate, Effective, Safe, Accessible CoQ10 -Appropriate, Effective, Safe, Accessible -Medications previously tried: simvastatin, rosuvastatin, atorvastatin (able to tolerate brand Lipitor) -Current exercise habits: limited due to breathing concerns (HF/chronic bronchitis, wearing oxygen) -Educated on Cholesterol goals;  -Recommended to continue current medication; repeat lipid panel at next OV  Heart Failure (Goal: manage symptoms and prevent exacerbations) -Not ideally controlled - pt has had 2 hospital visits for HF  exacerbations in 2023, most recently 12/2021; pt has been checking daily weights and taking torsemide regularly (every 2-3 days) and weight has been stable 172-174# lately; she reports she still feels bad in general most days -Last ejection fraction: 25-30% (Date: 09/2021) -HF type: Systolic; NYHA Class: III (marked limitation of activity) -Current home BP/HR readings: 109-112/50s -Daily weights: 172-174 lbs -Current treatment: Spironolactone 25 mg daily - Appropriate, Effective, Safe, Accessible Carvedilol 6.25 mg - 1/2 tablet BID -Appropriate, Effective, Safe, Accessible Losartan 12.5 mg daily -Appropriate, Effective, Safe, Accessible Torsemide 10 mg - takes every 2 days -Appropriate, Effective, Safe, Accessible Potassium chloride 10 mEq w/ torsemide -Appropriate, Effective, Safe, Accessible -Medications previously tried: Ashley [name] (low BP) -Educated on Importance of weighing daily; if you gain more than 3 pounds in one day or 5 pounds in one week, call cardiologist -Advised to make appt with Ashley [name] to discuss alternative devices and medication optimization - pt wants to know if she will ever go back on Entresto, discussed it is dependent on her blood pressure -Recommended to continue current medication  Atrial Fibrillation (Goal: prevent stroke and major bleeding) -Controlled -CHADSVASC: 5; new Afib Dx 12/2021 during acute CHF exacerbation - pt declined anticoagulation in hospital because she wanted to speak to her regular cardiologist about it -Current treatment: Carvedilol 6.25 mg - 1/2 tab BID -Medications previously tried: n/a -Counseled on increased risk of stroke due to Afib and benefits of anticoagulation for stroke prevention; -Recommended to continue current medication; consider anticoagulation or heart monitor to determine Afib burden  Asthma / chronic bronchitis (Goal: control symptoms and prevent exacerbations) -Not ideally controlled -Pt is requiring oxygen every  night and with most ADLs, she is wanting to get on Ashley [name] tank but has been told Medicare will not cover it.  -Pulmonary function testing: 10/2019 - FEV1 88% predicted, FEV1/FVC 72%, no bronchodilator  response -Exacerbations requiring treatment in last 6 months: 0 -Follows with pulmonology (Ashley [name]); overdue for f/u (advised 2-3 mos in 09/2021) -Current treatment  Qvar HFA 80 mcg BID (taking once daily) - Appropriate, Query  Effective Levalbuterol HFA PRN - rare use Oxygen (nighttime) -Medications previously tried: Spiriva, Arnuity, Advair, Asmanex, Anoro, albuterol (palpitations) -Patient reports consistent use of maintenance inhaler (once daily) -Frequency of rescue inhaler use: not using -Counseled on Proper inhaler technique; Benefits of consistent maintenance inhaler use -Advised to increase QVAR to twice daily; advised to make appt with pulmonology to discuss oxygen therapy options  Chronic Kidney Disease Stage 3a  -All medications assessed for renal dosing and appropriateness in chronic kidney disease. -Recommended to continue current medication  Health Maintenance -Vaccine gaps: Shingrix -Hx chronic respiratory failure on home O2 (covid 11/2020) -Hx Osteoporosis, s/p 5 years of fosamax -drug holiday since 2019 -Hx fibromyalgia, has had rheumatology workup and no further w/u recommended -Hx IBS, diverticulitis, GERD. Off PPI. -Current therapy:  Vitamin D 2000 IU daily Tylenol 500 mg Mucinex 600 mg  -Patient is satisfied with current therapy and denies issues -Recommended to continue current medication  Patient Goals/Self-Care Activities Patient will:  - take medications as prescribed as evidenced by patient report and record review focus on medication adherence by routine check blood pressure daily, document, and provide at future appointments weigh daily, and contact provider if weight gain of 3+ lbs in 1 day or 5+ lbs in 1 week      Patient verbalizes understanding  of instructions and care plan provided today and agrees to view in [place]. Active MyChart status and patient understanding of how to access instructions and care plan via MyChart confirmed with patient.    Telephone follow up appointment with pharmacy team member scheduled for: 1 month  Charlene Brooke, PharmD, [facility] Clinical Pharmacist [facility] [phone]

## 2022-02-15 NOTE — Progress Notes (Signed)
Chronic Care Management Pharmacy Note  02/15/2022 Name:  Ashley [name] MRN:  [id number] DOB:  [birth year]  Summary: CCM F/U visit -HFrEF: BP is low-stable 109-112/50s; she is compliant with new losartan and is off Entresto; pt is taking torsemide every 2-3 days and reports daily weights are stable 172-174 lbs, she knows to take extra torsemide if wt gain 3+ lbs overnight -Asthma/chronic bronchitis: pt is wearing oxygen every night and with many daily activities, she is frustrated with lack of mobility due to carrying O2 tank around; she is taking QVAR only once a day and thinks it is not helping much, she has not used rescue inhaler in months -Afib: new Dx 12/2021, pt declined anticoagulation in hospital due to wanting to discuss with her regular cardiologist; per chart review Afib was not mentioned in most recent cardiology OV; pt reports they discussed new impulse device instead  Recommendations/Changes made from today's visit: -Advised pt to make appts with Dr [name] and Dr [name] -Advised to increase QVAR to twice daily as prescribed -Consider re-opening anticoagulation discussion in future, or determine Afib burden  Plan: -Pharmacist follow up televisit scheduled for 2 months -PCP AWV due ~04/14/22    Subjective: Ashley [name] is an 76 y.o. year old female who is a primary patient of [name], MD.  The CCM team was consulted for assistance with disease management and care coordination needs.    Engaged with patient by telephone for follow up visit in response to provider referral for pharmacy case management and/or care coordination services.   Consent to Services:  The patient was given information about Chronic Care Management services, agreed to services, and gave verbal consent prior to initiation of services.  Please see initial visit note for detailed documentation.   Patient Care Team: [name], MD as PCP - General Lovena Le Champ Mungo, MD as PCP  - Electrophysiology (Cardiology) Minna Merritts, MD as PCP - Cardiology (Cardiology) [name], MD as Referring Physician (Family Medicine) Lafayette Dragon, MD (Inactive) as Consulting Physician (Gastroenterology) Evans Lance, MD as Consulting Physician (Cardiology) [name] Kathlene November, MD as Consulting Physician (Cardiology) [name], [facility] as Pharmacist (Pharmacist)  Recent office visits: 01/13/22 Dr Danise Mina OV: hospital f/u; pt is taking torsemide only QOD, seems stable. Update Mg lab. Restarted PRN O2. Longstanding dry mouth - check for Sjorgens (negative results). Encouraged daily stool softener for hard stools.  Recent consult visits: 01/24/22 Dr [name] (Cardiology): take extra torsemide for wt > 174 lbs.   01/18/22 NP Allena Katz (HF clinic) - stay off Entresto. Start losartan 12.5 mg daily. Repeat labs 2 weeks.  01/13/22 PA Barrington Ellison (Cardiology): hospital f/u - no changes.  Hospital visits: 01/01/22 - 01/09/22 Admission [facility]): Acute CHF, AKI. Continue torsemide 10 mg daily w/ KCL. Resume spironolactone. Hold Entresto (low BP) until OP F/U. Declined anticoagulation for Afib.    Objective:  Lab Results  Component Value Date   CREATININE 0.99 01/30/2022   BUN 20 01/30/2022   GFR 57.58 (L) 10/14/2021   EGFR 44 (L) 02/14/2017   GFRNONAA 59 (L) 01/30/2022   GFRAA >60 09/10/2019   NA 140 01/30/2022   K 4.2 01/30/2022   CALCIUM 9.9 01/30/2022   CO2 21 (L) 01/30/2022   GLUCOSE 116 (H) 01/30/2022    Lab Results  Component Value Date/Time   HGBA1C 5.9 04/01/2021 08:46 AM   HGBA1C 5.9 12/01/2019 09:41 AM   GFR 57.58 (L) 10/14/2021 11:41 AM   GFR 61.63 04/01/2021  08:46 AM    Last diabetic Eye exam: No results found for: "HMDIABEYEEXA"  Last diabetic Foot exam: No results found for: "HMDIABFOOTEX"   Lab Results  Component Value Date   CHOL 194 04/01/2021   HDL 49.90 04/01/2021   LDLCALC 108 (H) 04/01/2021   LDLDIRECT 164.0 11/27/2018   TRIG  183.0 (H) 04/01/2021   CHOLHDL 4 04/01/2021       Latest Ref Rng & Units 06/30/2021    3:03 PM 04/01/2021    8:46 AM 02/07/2021    2:09 PM  Hepatic Function  Total Protein 6.5 - 8.1 g/dL 7.4  7.7    Albumin 3.5 - 5.0 g/dL 3.7  4.4  4.5   AST 15 - 41 U/L 26  25    ALT 0 - 44 U/L 32  24    Alk Phosphatase 38 - 126 U/L 102  95    Total Bilirubin 0.3 - 1.2 mg/dL 0.4  0.3      Lab Results  Component Value Date/Time   TSH 0.79 02/07/2021 02:09 PM   TSH 1.18 05/31/2018 11:51 AM       Latest Ref Rng & Units 01/03/2022    4:28 AM 01/02/2022    7:25 AM 01/01/2022    5:02 AM  CBC  WBC 4.0 - 10.5 K/uL 11.2  10.0    Hemoglobin 12.0 - 15.0 g/dL 11.4  11.4  13.9   Hematocrit 36.0 - 46.0 % 34.5  36.2  41.0   Platelets 150 - 400 K/uL 193  159      Lab Results  Component Value Date/Time   VD25OH 27.58 (L) 04/01/2021 08:46 AM   VD25OH 33.99 12/01/2019 09:41 AM    Clinical ASCVD: No  The 10-year ASCVD risk score (Arnett DK, et al., 2019) is: 14.2%   Values used to calculate the score:     Age: 63 years     Sex: Female     Is Non-Hispanic African American: No     Diabetic: No     Tobacco smoker: No     Systolic Blood Pressure: 98 mmHg     Is BP treated: Yes     HDL Cholesterol: 49.9 mg/dL     Total Cholesterol: 194 mg/dL       04/15/2021   11:50 AM 11/23/2020    8:07 AM 01/12/2020   11:17 AM  Depression screen PHQ 2/9  Decreased Interest 3 0 1  Down, Depressed, Hopeless 1 0 2  PHQ - 2 Score 4 0 3  Altered sleeping 3 0 3  Tired, decreased energy $RemoveBeforeDE'3 3 3  'KhnHvlIcLZYOXdR$ Change in appetite 0 0 2  Feeling bad or failure about yourself  0 0 0  Trouble concentrating 1 0 1  Moving slowly or fidgety/restless 0 0 0  Suicidal thoughts 0 0 0  PHQ-9 Score $RemoveBef'11 3 12  'ideabvgAkt$ Difficult doing work/chores  Not difficult at all Somewhat difficult     CHA2DS2/VAS Stroke Risk Points  Current as of yesterday     5 >= 2 Points: High Risk  1 - 1.99 Points: Medium Risk  0 Points: Low Risk    No Change      Points  Metrics  1 Has Congestive Heart Failure:  Yes    Current as of yesterday  0 Has Vascular Disease:  No    Current as of yesterday  1 Has Hypertension:  Yes    Current as of yesterday  2 Age:  74  Current as of yesterday  0 Has Diabetes:  No    Current as of yesterday  0 Had Stroke:  No  Had TIA:  No  Had Thromboembolism:  No    Current as of yesterday  1 Female:  Yes    Current as of yesterday    Social History   Tobacco Use  Smoking Status Never  Smokeless Tobacco Never  Tobacco Comments   Lived with smokers x 23 yrs, smoked herself "for a week"   BP Readings from Last 3 Encounters:  01/24/22 98/60  01/18/22 110/70  01/13/22 118/68   Pulse Readings from Last 3 Encounters:  01/24/22 75  01/18/22 80  01/13/22 84   Wt Readings from Last 3 Encounters:  01/24/22 177 lb 6 oz (80.5 kg)  01/18/22 176 lb 4.8 oz (80 kg)  01/13/22 178 lb 6 oz (80.9 kg)   BMI Readings from Last 3 Encounters:  01/24/22 29.52 kg/m  01/18/22 29.34 kg/m  01/13/22 29.68 kg/m    Assessment/Interventions: Review of patient past medical history, allergies, medications, health status, including review of consultants reports, laboratory and other test data, was performed as part of comprehensive evaluation and provision of chronic care management services.   SDOH:  (Social Determinants of Health) assessments and interventions performed: Yes SDOH Interventions    Flowsheet Row Chronic Care Management from 02/15/2022 in [place] at [place] Most recent reading at 02/15/2022 11:12 AM Chronic Care Management from 05/24/2021 in [place] at [place] Most recent reading at 05/24/2021  2:14 PM Office Visit from 04/15/2021 in [place] at [place] Most recent reading at 04/16/2021 11:36 AM Cardiac Rehab from 12/29/2019 in [facility] Most recent reading at 12/29/2019 11:29 AM Clinical Support from 12/01/2019 in [place] at [place] Most  recent reading at 12/01/2019  2:58 PM Cardiac Rehab from 12/01/2019 in [facility] Cardiac [facility] Pul[facility] Re[facility] Most recent reading at 12/01/2019 11:41 AM  SDOH Interventions        Housing Interventions Intervention Not Indicated -- -- -- -- --  Transportation Interventions Intervention Not Indicated -- -- -- -- --  Depression Interventions/Treatment  -- -- Medication, Currently on Treatment Medication PHQ2-9 Score <4 Follow-up Not Indicated Counseling, Medication  Financial Strain Interventions -- Other (Comment)  Dorothy Spark additional options for assistance] -- -- -- --      SDOH Screenings   Food Insecurity: No Food Insecurity (12/01/2019)  Housing: Low Risk  (12/01/2019)  Transportation Needs: No Transportation Needs (02/15/2022)  Alcohol Screen: Low Risk  (12/01/2019)  Depression (PHQ2-9): High Risk (04/15/2021)  Financial Resource Strain: Medium Risk (05/24/2021)  Physical Activity: Insufficiently Active (12/01/2019)  Stress: Stress Concern Present (12/01/2019)  Tobacco Use: Low Risk  (01/24/2022)    CCM Care Plan  Allergies  Allergen Reactions   Chlorhexidine Other (See Comments)    blisters   Codeine Other (See Comments)    Confusion and dizziness   Cyclobenzaprine Other (See Comments)    Adverse reaction - back and throat pain, dizziness, exhaustion   Cymbalta [Duloxetine Hcl] Other (See Comments)    "body spasms and made me feel weird in my head"    Diflucan [Fluconazole] Nausea And Vomiting   Dilaudid [Hydromorphone Hcl] Other (See Comments)    Feels like she is burning internally    Influenza Vac Split [Influenza Virus Vaccine] Other (See Comments)    "allergic to concentrated eggs that are in vaccine"   Lasix [Furosemide] Other (See Comments)  Severe hypotension on lasix 40 PO (can handle 20 mg oral dosing) 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    Latex Nausea Only, Rash and Other (See Comments)    Pain and infection   Lipitor [Atorvastatin] Other (See Comments)    Severe muscle cramps to  generic atorvastatin.  Able to take brand lipitor.   Morphine And Related Other (See Comments)    Internally feels like she is on fire    Pravastatin Other (See Comments)    myalgias   Prevacid [Lansoprazole] Other (See Comments)    Worsened GI side effects   Prevnar [Pneumococcal 13-Val Conj Vacc] Other (See Comments)    Egg allergy Bad reaction after shot   Rosuvastatin Other (See Comments)    Was not effective controlling lipids   Simvastatin Other (See Comments)    Leg cramps   Tape Other (See Comments) and Rash    All adhesives  - Blisters, itching and burning  blistering   Farxiga [Dapagliflozin] Other (See Comments)    Unknown reaction    Metaxalone Other (See Comments)    Unknown reaction    Tramadol     Leg cramps   Antihistamines, Chlorpheniramine-Type Other (See Comments)    Alters vision   Betadine [Povidone Iodine] Rash    Per pt.    Onion Diarrhea and Other (See Comments)    GI distress, flared IBS   Other Hives and Rash    NOTE: pt is able to take cephalosporins without reaction   Penicillins Other (See Comments)    CHILDHOOD ALLEGY/REACTION: "told 50 years ago I couldn't take it; might be immune to it", able to take amoxicillin Has patient had a PCN reaction causing immediate rash, facial/tongue/throat swelling, SOB or lightheadedness with hypotension: Unknown Has patient had a PCN reaction causing severe rash involving mucus membranes or skin necrosis: UnknowN Has patient had a PCN reaction that required hospitalization: /Unknown Has patient had a PCN reaction occurring within the last 10 years: Unknown If a   Red [place] Nausea Only and Swelling   Sertraline Other (See Comments)    Overly sedating   Sulfasalazine Other (See Comments)    "makes my whole body smell like sulfa & makes me sick just smelling it"    Medications Reviewed Today     Reviewed by [name], [facility] (Pharmacist) on 02/15/22 at [place] List Status: <None>   Medication  Order Taking? Sig Documenting Provider Last Dose Status Informant  acetaminophen (TYLENOL) 500 MG tablet [id number] Yes Take 500 mg by mouth daily as needed for moderate pain or headache (PAIN). [provider] Taking Active Self  aspirin EC 81 MG tablet [id number] Yes Take 81 mg by mouth at bedtime. [provider] Taking Active Self  beclomethasone (QVAR) 80 MCG/ACT inhaler [id number] Yes Inhale 2 puffs into the lungs 2 (two) times daily. Melvenia Needles, NP Taking Active Self    Discontinued 07/24/11 1500 carvedilol (COREG) 6.25 MG tablet [id number] Yes TAKE 0.5 TABLETS (3.125 MG TOTAL) BY MOUTH 2 (TWO) TIMES DAILY WITH A MEAL. Minna Merritts, MD Taking Active Self  Cholecalciferol (VITAMIN D3) 50 MCG (2000 UT) capsule [id number] Yes Take 1 capsule (2,000 Units total) by mouth daily. [name], MD Taking Active Self  ezetimibe (ZETIA) 10 MG tablet [id number] Yes TAKE 1 TABLET BY MOUTH EVERY DAY Gollan, Kathlene November, MD Taking Active Self  guaiFENesin (MUCINEX) 600 MG 12 hr tablet [id number] Yes Take 1 tablet (600 mg total) by mouth 2 (two) times  daily as needed for to loosen phlegm. [name], MD Taking Active   levalbuterol [facility]) 45 MCG/ACT inhaler [id number] Yes Inhale 2 puffs into the lungs every 6 (six) hours as needed for wheezing or shortness of breath. Furth, Cadence H, PA-C Taking Active Self  losartan (COZAAR) 25 MG tablet [id number] Yes Take 0.5 tablets (12.5 mg total) by mouth at bedtime. Northwood, Anderson Malta, FNP Taking Active   Menthol, Topical Analgesic, (BIOFREEZE ROLL-ON EX) [id number] Yes Apply 1 Application topically as needed (For shoulder pain). [provider] Taking Active Self  miconazole (MICOTIN) 2 % powder [id number] Yes Apply 1 application  topically as needed (yeast). [provider] Taking Active Self  nystatin cream (MYCOSTATIN) [id number] Yes Apply 1 application. topically as needed for dry skin (yeast infection). [name], MD Taking Active Self  OXYGEN [id number] Yes Inhale 2 L into the lungs at bedtime as needed (Shortness of breath). [provider] Taking Active Self  potassium chloride (KLOR-CON) 10 MEQ tablet [id number] Yes Take 1 tablet (10 mEq total) by mouth every other day. [name], MD Taking Active   spironolactone (ALDACTONE) 25 MG tablet [id number] Yes Take 1 tablet (25 mg total) by mouth daily. Start taking on 01/10/22 Arrien, York Ram, MD Taking Active   torsemide [facility]) 10 MG tablet [id number] Yes Take 1 tablet (10 mg total) by mouth every other day. Take extra torsemide 10 mg with potassium as needed for weight >174 Antonieta Iba, MD Taking Active             Patient Active Problem List   Diagnosis Date Noted   Dry mouth 01/14/2022   CKD (chronic kidney disease) stage 2, GFR 60-89 ml/min 01/14/2022   Pneumonia 01/04/2022   Paroxysmal atrial fibrillation (HCC) 01/04/2022   Renal lesion 10/14/2021   Diverticulitis of large intestine without perforation or abscess without bleeding 07/12/2021   Statin intolerance 04/16/2021   Hair loss 04/16/2021   Dizziness 02/07/2021   Nocturnal hypoxia 01/01/2021   UTI (urinary tract infection) 10/15/2020   Bilateral flank pain 10/15/2020   Cervical pain (neck) 02/04/2019   Acute on chronic combined systolic and diastolic CHF (congestive heart failure) (HCC) 10/14/2018   Bilateral leg numbness 06/01/2018   Mixed hyperlipidemia 01/17/2018   Plantar fasciitis, bilateral 12/13/2017   Acquired renal cyst of right kidney 08/11/2017   Unsteadiness on feet 07/24/2017   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   High total serum IgM 11/22/2016   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   Bleeding external hemorrhoids 10/31/2016   LBBB (left bundle branch block) 07/11/2016   Gastritis 06/12/2016   Fecal incontinence    Chronic generalized abdominal pain 11/22/2015   Abdominal aortic atherosclerosis (HCC) 11/13/2015   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   Sore throat 07/12/2015   Hoarseness 06/21/2015   Hypersensitivity to pneumococcal vaccine 06/10/2015   Medicare annual wellness visit, subsequent 06/08/2015   Advanced care planning/counseling discussion 06/08/2015   Intertrigo 04/06/2015   Nonintractable episodic headache 04/06/2015   Peripheral neuropathy 04/06/2015   Essential hypertension    S/P colostomy takedown 02/10/2014   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 10/23/2013   H/O multiple allergies 10/10/2013   History of diverticular abscess 09/25/2013   Osteoporosis 01/13/2013   Irregular heart beat 08/06/2012   MDD (major depressive disorder), recurrent episode, moderate (HCC) 07/09/2012   Chronic recurrent sinusitis 06/17/2012   Vitamin D deficiency 03/05/2012   Essential tremor 12/29/2011   GERD (gastroesophageal reflux disease) 11/13/2011   Allergic rhinitis 11/13/2011  Chronic respiratory failure with hypoxia (HCC) 07/31/2011   Dilated cardiomyopathy ([place]) 03/01/2011   Mitral regurgitation 59/93/5701   Chronic systolic CHF (congestive heart failure) ([place]) 02/24/2011   Chronic bronchitis ([place]) 01/20/2011   Syncope 11/02/2010   Chronic fatigue and malaise 11/02/2010   Anxiety state 08/18/2010   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   Irritable bowel syndrome 11/22/2006   FIBROCYSTIC BREAST DISEASE 11/22/2006   Osteoarthritis 11/22/2006   Fibromyalgia 11/22/2006    Immunization History  Administered Date(s) Administered   Pneumococcal Conjugate-13 06/08/2015   Pneumococcal Polysaccharide-23 02/16/2012   Td 11/12/2005    Conditions to be addressed/monitored:  Hyperlipidemia, Atrial Fibrillation, Heart Failure, and Asthma/Chronic bronchitis  Care Plan : [place]  Updates made by [name], [place] since 02/15/2022 12:00 AM     Problem: Hyperlipidemia, Atrial Fibrillation, Heart Failure, and Asthma/Chronic bronchitis   Priority: High      Long-Range Goal: Disease Management   Start Date: 05/24/2021  Expected End Date: 02/16/2023  This Visit's Progress: On track  Priority: High  Note:   Current Barriers:  Unable to maintain control of heart failure/chronic bronchitis  Pharmacist Clinical Goal(s):  Patient will adhere to plan to optimize therapeutic regimen for HF/chronic bronchitis as evidenced by report of adherence to recommended medication management changes through collaboration with PharmD and provider.   Interventions: 1:1 collaboration with [name], MD regarding development and update of comprehensive plan of care as evidenced by provider attestation and co-signature Inter-disciplinary care team collaboration (see longitudinal plan of care) Comprehensive medication review performed; medication list updated in electronic medical record  Hyperlipidemia: (LDL goal < 100) -Not ideally controlled - LDL 108 (03/2021), pt has not tolerated several statins -Hx aortic atherosclerosis -Current treatment: Ezetimibe 10 mg daily -Appropriate, Query Effective Aspirin 81 mg daily -Appropriate, Effective, Safe, Accessible CoQ10 -Appropriate, Effective, Safe, Accessible -Medications previously tried: simvastatin, rosuvastatin, atorvastatin (able to tolerate brand Lipitor) -Current exercise habits: limited due to breathing concerns (HF/chronic bronchitis, wearing oxygen) -Educated on Cholesterol goals;  -Recommended to continue current medication; repeat lipid panel at next OV  Heart Failure (Goal: manage symptoms and prevent exacerbations) -Not ideally controlled - pt has had 2 hospital visits for HF exacerbations in 2023, most recently 12/2021; pt has been checking daily weights and taking torsemide regularly (every 2-3 days) and weight has been stable 172-174# lately; she reports she still feels bad in general most days -Last ejection fraction: 25-30% (Date: 09/2021) -HF type: Systolic; NYHA Class: III (marked  limitation of activity) -Current home BP/HR readings: 109-112/50s -Daily weights: 172-174 lbs -Current treatment: Spironolactone 25 mg daily - Appropriate, Effective, Safe, Accessible Carvedilol 6.25 mg - 1/2 tablet BID -Appropriate, Effective, Safe, Accessible Losartan 12.5 mg daily -Appropriate, Effective, Safe, Accessible Torsemide 10 mg - takes every 2 days -Appropriate, Effective, Safe, Accessible Potassium chloride 10 mEq w/ torsemide -Appropriate, Effective, Safe, Accessible -Medications previously tried: [name] (low BP) -Educated on Importance of weighing daily; if you gain more than 3 pounds in one day or 5 pounds in one week, call cardiologist -Advised to make appt with Dr [name] to discuss alternative devices and medication optimization - pt wants to know if she will ever go back on Entresto, discussed it is dependent on her blood pressure -Recommended to continue current medication  Atrial Fibrillation (Goal: prevent stroke and major bleeding) -Controlled -CHADSVASC: 5; new Afib Dx 12/2021 during acute CHF exacerbation - pt declined anticoagulation in hospital because she wanted to speak to her regular  cardiologist about it -Current treatment: Carvedilol 6.25 mg - 1/2 tab BID -Medications previously tried: n/a -Counseled on increased risk of stroke due to Afib and benefits of anticoagulation for stroke prevention; -Recommended to continue current medication; consider anticoagulation or heart monitor to determine Afib burden  Asthma / chronic bronchitis (Goal: control symptoms and prevent exacerbations) -Not ideally controlled -Pt is requiring oxygen every night and with most ADLs, she is wanting to get on [name] tank but has been told Medicare will not cover it.  -Pulmonary function testing: 10/2019 - FEV1 88% predicted, FEV1/FVC 72%, no bronchodilator response -Exacerbations requiring treatment in last 6 months: 0 -Follows with pulmonology (Dr [name]); overdue  for f/u (advised 2-3 mos in 09/2021) -Current treatment  Qvar HFA 80 mcg BID (taking once daily) - Appropriate, Query  Effective Levalbuterol HFA PRN - rare use Oxygen (nighttime) -Medications previously tried: Spiriva, Arnuity, Advair, Asmanex, Anoro, albuterol (palpitations) -Patient reports consistent use of maintenance inhaler (once daily) -Frequency of rescue inhaler use: not using -Counseled on Proper inhaler technique; Benefits of consistent maintenance inhaler use -Advised to increase QVAR to twice daily; advised to make appt with pulmonology to discuss oxygen therapy options  Chronic Kidney Disease Stage 3a  -All medications assessed for renal dosing and appropriateness in chronic kidney disease. -Recommended to continue current medication  Health Maintenance -Vaccine gaps: Shingrix -Hx chronic respiratory failure on home O2 (covid 11/2020) -Hx Osteoporosis, s/p 5 years of fosamax -drug holiday since 2019 -Hx fibromyalgia, has had rheumatology workup and no further w/u recommended -Hx IBS, diverticulitis, GERD. Off PPI. -Current therapy:  Vitamin D 2000 IU daily Tylenol 500 mg Mucinex 600 mg  -Patient is satisfied with current therapy and denies issues -Recommended to continue current medication  Patient Goals/Self-Care Activities Patient will:  - take medications as prescribed as evidenced by patient report and record review focus on medication adherence by routine check blood pressure daily, document, and provide at future appointments weigh daily, and contact provider if weight gain of 3+ lbs in 1 day or 5+ lbs in 1 week      Medication Assistance: None required.  Patient affirms current coverage meets needs.  Compliance/Adherence/Medication fill history: Care Gaps: None  Star-Rating Drugs: Losartan - PDC 100%  Medication Access: Within the past 30 days, how often has patient missed a dose of medication? 0 Is a pillbox or other method used to improve  adherence? Yes  Factors that may affect medication adherence? no barriers identified Are meds synced by current pharmacy? No  Are meds delivered by current pharmacy? No  Does patient experience delays in picking up medications due to transportation concerns? No   [facility] Reviewed: Is patient disadvantaged to use [facility]?: Yes  Current Rx insurance plan: [employer] [place] Name and location of Current pharmacy:  [id number] [place] [address] [place] [address] Phone: [phone] Fax: [phone]  [facility], [place] - [address] [place] [address] Phone: [phone] Fax: [phone]  [facility] services reviewed with patient today?: No  Patient requests to transfer care to [facility]?: No  Reason patient declined to change pharmacies: Disadvantaged due to insurance/mail order   Care Plan and Follow Up Patient Decision:  Patient agrees to Care Plan and Follow-up.  Plan: Telephone follow up appointment with care management team member scheduled for:  2 months  Charlene Brooke, PharmD, BCACP Clinical Pharmacist [facility]  [phone]

## 2022-02-16 ENCOUNTER — Ambulatory Visit (INDEPENDENT_AMBULATORY_CARE_PROVIDER_SITE_OTHER): Payer: Medicare Other | Admitting: Pulmonary Disease

## 2022-02-16 ENCOUNTER — Encounter: Payer: Self-pay | Admitting: Pulmonary Disease

## 2022-02-16 VITALS — BP 95/52 | HR 80 | Temp 97.9°F | Ht 65.0 in | Wt 177.0 lb

## 2022-02-16 DIAGNOSIS — J4530 Mild persistent asthma, uncomplicated: Secondary | ICD-10-CM | POA: Diagnosis not present

## 2022-02-16 DIAGNOSIS — I420 Dilated cardiomyopathy: Secondary | ICD-10-CM

## 2022-02-16 DIAGNOSIS — R0602 Shortness of breath: Secondary | ICD-10-CM

## 2022-02-16 NOTE — Progress Notes (Signed)
Subjective:    Patient ID: Ashley [name], female    DOB: 1945/12/17, 76 y.o.   MRN: [id number] Patient Care Team: [name], MD as PCP - General Marinus Maw, MD as PCP - Electrophysiology (Cardiology) Antonieta Iba, MD as PCP - Cardiology (Cardiology) [name], MD as Referring Physician (Family Medicine) Hart Carwin, MD (Inactive) as Consulting Physician (Gastroenterology) Marinus Maw, MD as Consulting Physician (Cardiology) Antonieta Iba, MD as Consulting Physician (Cardiology) Kathyrn Sheriff, [facility] as Pharmacist (Pharmacist)  Chief Complaint  Patient presents with   Follow-up    COPD. In the hospital for pneumonia 3 weeks ago. SOB constant. Occasional wheezing. Coughs all the time with clear sputum.    HPI Patient is a 76 year old who follows here for the issue of chronic cough.  She has asthma and allergic rhinitis.  History significant for nonischemic cardiomyopathy, IgM lambda monoclonal gammopathy as well.  Patient is on Qvar for management of her asthma symptoms.  She endorses multiple sensitivities to different inhalers and Qvar appears to be what she can tolerate.  She uses Xopenex for rescue as needed.  She was last evaluated by me on 21 Sep 2021 and at that time she had noted some increase in her cough.  We instructed her to increase her Qvar at least until her cough subsided some.  It has been difficult to treat her due to her perceived intolerances of various medications.  She is on Entresto for her heart failure and unfortunately this will also have the side effect of cough.  However she does require the Entresto due to her severe cardiomyopathy.  She was hospitalized with pneumonia 3 weeks ago.  She notes occasional wheezing, has not had any fevers, chills or sweats since discharge.  Data: 11/04/2019 PFTs: FEV1 was 1.97 L or 88% predicted, FVC C was 2.72 L or 91% predicted, FEV1/FVC was 72%.  There was no bronchodilator response.   ERV was 12% consistent with obesity. There was no overt air trapping or hyperinflation (my review).  Flow volume loop was normal.  Diffusion capacity normal.  Normal PFTs for age.  Review of Systems A 10 point review of systems was performed and it is as noted above otherwise negative.  Patient Active Problem List   Diagnosis Date Noted   Severe mitral insufficiency April 10, 2022   Acute on chronic systolic heart failure, NYHA class 3 2022/04/10   Congestive heart failure 2022-04-10   Insomnia 03/25/2022   Dry mouth 01/14/2022   CKD (chronic kidney disease) stage 3, GFR 30-59 ml/min 01/14/2022   Pneumonia 01/04/2022   Paroxysmal atrial fibrillation 01/04/2022   Renal lesion 10/14/2021   Diverticulitis of large intestine without perforation or abscess without bleeding 07/12/2021   Statin intolerance 04/16/2021   Hair loss 04/16/2021   Dizziness 02/07/2021   Nocturnal hypoxia 01/01/2021   Acute respiratory failure with hypoxia 12/02/2020   Bilateral flank pain 10/15/2020   Cervical pain (neck) 02/04/2019   Acute on chronic combined systolic and diastolic CHF (congestive heart failure) 10/14/2018   Bilateral leg numbness 06/01/2018   Mixed hyperlipidemia 01/17/2018   Plantar fasciitis, bilateral 12/13/2017   Acquired renal cyst of right kidney 08/11/2017   Unsteadiness on feet 07/24/2017   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   High total serum IgM 11/22/2016   IgG deficiency 11/22/2016   Bleeding external hemorrhoids 10/31/2016   LBBB (left bundle branch block) 07/11/2016   Gastritis 06/12/2016   Fecal incontinence    Chronic generalized abdominal pain  11/22/2015   Abdominal aortic atherosclerosis 11/13/2015   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   Hoarseness 06/21/2015   Diarrhea 06/21/2015   Hypersensitivity to pneumococcal vaccine 06/10/2015   Medicare annual wellness visit, subsequent 06/08/2015   Advanced care planning/counseling discussion 06/08/2015   Intertrigo 04/06/2015    Nonintractable episodic headache 04/06/2015   Peripheral neuropathy 04/06/2015   Essential hypertension    Generalized abdominal pain 05/28/2014   S/P colostomy takedown 02/10/2014   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 10/23/2013   H/O multiple allergies 10/10/2013   History of diverticular abscess 09/25/2013   Osteoporosis 01/13/2013   Irregular heart beat 08/06/2012   MDD (major depressive disorder), recurrent episode, moderate (HCC) 07/09/2012   Chronic recurrent sinusitis 06/17/2012   Vitamin D deficiency 03/05/2012   Essential tremor 12/29/2011   GERD (gastroesophageal reflux disease) 11/13/2011   Allergic rhinitis 11/13/2011   Chronic respiratory failure with hypoxia (HCC) 07/31/2011   Dilated cardiomyopathy 03/01/2011   Nonrheumatic mitral (valve) insufficiency 02/24/2011   Chronic combined systolic and diastolic CHF (congestive heart failure) 02/24/2011   Chronic bronchitis 01/20/2011   Syncope 11/02/2010   Chronic fatigue and malaise 11/02/2010   Anxiety state 08/18/2010   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   Irritable bowel syndrome 11/22/2006   FIBROCYSTIC BREAST DISEASE 11/22/2006   Osteoarthritis 11/22/2006   Fibromyalgia 11/22/2006   Social History   Tobacco Use   Smoking status: Never   Smokeless tobacco: Never   Tobacco comments:    Lived with smokers x 23 yrs, smoked herself "for a week"  Substance Use Topics   Alcohol use: No    Alcohol/week: 0.0 standard drinks of alcohol   Allergies  Allergen Reactions   Chlorhexidine Other (See Comments)    blisters   Codeine Other (See Comments)    Confusion and dizziness Makes pain level worse   Cyclobenzaprine Other (See Comments)    Adverse reaction - back and throat pain, dizziness, exhaustion   Cymbalta [Duloxetine Hcl] Other (See Comments)    "body spasms and made me feel weird in my head"    Diflucan [Fluconazole] Nausea And Vomiting   Dilaudid [Hydromorphone Hcl]  Other (See Comments)    Feels like she is burning internally    Influenza Vac Split [Influenza Virus Vaccine] Other (See Comments)    "allergic to concentrated eggs that are in vaccine"   Lasix [Furosemide] Other (See Comments)    Severe hypotension on lasix 40 PO (can handle 20 mg oral dosing) 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  Can take a different amount   Latex Nausea Only, Rash and Other (See Comments)    Pain and infection   Lipitor [Atorvastatin] Other (See Comments)    Severe muscle cramps to generic atorvastatin.  Able to take brand lipitor.   Morphine And Related Other (See Comments)    Internally feels like she is on fire    Pravastatin Other (See Comments)    myalgias   Prevacid [Lansoprazole] Other (See Comments)    Worsened GI side effects   Prevnar [Pneumococcal 13-Val Conj Vacc] Other (See Comments)    Egg allergy Bad reaction after shot   Rosuvastatin Other (See Comments)    Was not effective controlling lipids   Simvastatin Other (See Comments)    Leg cramps   Tape Other (See Comments) and Rash    All adhesives  - Blisters, itching and burning  blistering   Farxiga [Dapagliflozin] Other (See Comments)    Unknown reaction    Metaxalone Other (See  Comments)    Unknown reaction    Tramadol     Leg cramps   Antihistamines, Chlorpheniramine-Type Other (See Comments)    Alters vision   Betadine [Povidone Iodine] Rash    Per pt.    Onion Diarrhea and Other (See Comments)    GI distress, flared IBS   Other Hives and Rash    NOTE: pt is able to take cephalosporins without reaction   Penicillins Other (See Comments)    CHILDHOOD ALLEGY/REACTION: "told 50 years ago I couldn't take it; might be immune to it", able to take amoxicillin Has patient had a PCN reaction causing immediate rash, facial/tongue/throat swelling, SOB or lightheadedness with hypotension: Unknown Has patient had a PCN reaction causing severe rash involving mucus membranes or skin necrosis: UnknowN Has  patient had a PCN reaction that required hospitalization: /Unknown Has patient had a PCN reaction occurring within the last 10 years: Unknown If a   Red Dye Nausea Only and Swelling   Sertraline Other (See Comments)    Overly sedating   Sulfasalazine Other (See Comments)    "makes my whole body smell like sulfa & makes me sick just smelling it"   Current Meds  Medication Sig   acetaminophen (TYLENOL) 500 MG tablet Take 250 mg by mouth daily as needed for moderate pain or headache (PAIN).   aspirin EC 81 MG tablet Take 81 mg by mouth at bedtime.   beclomethasone (QVAR) 80 MCG/ACT inhaler Inhale 2 puffs into the lungs 2 (two) times daily.   Cholecalciferol (VITAMIN D3) 50 MCG (2000 UT) capsule Take 1 capsule (2,000 Units total) by mouth daily.   ezetimibe (ZETIA) 10 MG tablet TAKE 1 TABLET BY MOUTH EVERY DAY (Patient taking differently: Take 10 mg by mouth at bedtime.)   guaiFENesin (MUCINEX) 600 MG 12 hr tablet Take 1 tablet (600 mg total) by mouth 2 (two) times daily as needed for to loosen phlegm. (Patient taking differently: Take 600 mg by mouth daily.)   levalbuterol (XOPENEX HFA) 45 MCG/ACT inhaler Inhale 2 puffs into the lungs every 6 (six) hours as needed for wheezing or shortness of breath. (Patient taking differently: Inhale 1-2 puffs into the lungs every 4 (four) hours as needed for wheezing or shortness of breath (Asthma).)   Menthol, Topical Analgesic, (BIOFREEZE ROLL-ON EX) Apply 1 Application topically daily as needed (For shoulder pain).   miconazole (MICOTIN) 2 % powder Apply 1 application  topically daily as needed (yeast infection on stomach).   nystatin cream (MYCOSTATIN) Apply 1 application. topically as needed for dry skin (yeast infection).   OXYGEN Inhale 2 L into the lungs continuous.   [DISCONTINUED] carvedilol (COREG) 6.25 MG tablet TAKE 0.5 TABLETS (3.125 MG TOTAL) BY MOUTH 2 (TWO) TIMES DAILY WITH A MEAL.   [DISCONTINUED] losartan (COZAAR) 25 MG tablet Take 0.5  tablets (12.5 mg total) by mouth at bedtime.   [DISCONTINUED] potassium chloride (KLOR-CON) 10 MEQ tablet Take 1 tablet (10 mEq total) by mouth every other day.   spironolactone (ALDACTONE) 25 MG tablet Take 1 tablet (25 mg total) by mouth daily. Start taking on 01/10/22   torsemide (DEMADEX) 10 MG tablet Take 1 tablet (10 mg total) by mouth every other day. Take extra torsemide 10 mg with potassium as needed for weight >174   Immunization History  Administered Date(s) Administered   Pneumococcal Conjugate-13 06/08/2015   Pneumococcal Polysaccharide-23 02/16/2012   Td 11/12/2005      Objective:   Physical Exam BP (!) 95/52 (BP Location:  Right Arm, Cuff Size: Normal)   Pulse 80   Temp 97.9 F (36.6 C)   Ht 5\' 5"  (1.651 m)   Wt 177 lb (80.3 kg)   SpO2 94%   BMI 29.45 kg/m  GENERAL: Obese woman, no acute distress.  Fully ambulatory.  No conversational dyspnea. HEAD: Normocephalic, atraumatic.  EYES: Pupils equal, round, reactive to light.  No scleral icterus.  MOUTH: Masking requirements. NECK: Supple. No thyromegaly. Trachea midline. No JVD.  No adenopathy. PULMONARY: Good air entry bilaterally.  No adventitious sounds. CARDIOVASCULAR: S1 and S2. Regular rate and rhythm.  No rubs, murmurs or gallops heard. ABDOMEN: Protuberant, otherwise benign. MUSCULOSKELETAL: No joint deformity, no clubbing, no edema.  NEUROLOGIC: No focal deficit, no gait disturbance, speech is fluent. SKIN: Intact,warm,dry.  No rashes noted. PSYCH: Mood and behavior normal.   Ambulatory oximetry was performed today: At rest oxygen saturation was 94%, the patient maintain oxygen saturations between 95 to 94% throughout the entire walk.  She was able to ambulate 750 feet.  Minimal to no dyspnea.    Assessment & Plan:     ICD-10-CM   1. SOB (shortness of breath)  R06.02 Pulse oximetry, overnight   Ambulatory oximetry did not show desaturations today Will obtain overnight oximetry Multifactorial:  Cardiac/deconditioning    2. Mild persistent asthma, unspecified whether complicated  J45.30    Continue Qvar Continue as needed levo albuterol    3. Cardiomyopathy, dilated, nonischemic (HCC)  I42.0    This issue adds complexity to her management LVEF 20 to 25% Continue follow-up with cardiology     Orders Placed This Encounter  Procedures   Pulse oximetry, overnight    Room Air & Adapt    Standing Status:   Future    Standing Expiration Date:   02/17/2023   Patient in follow-up in 4 months time call sooner should any new problems arise.  Gailen Shelter, MD Advanced Bronchoscopy PCCM [facility]    *This note was dictated using voice recognition software/Dragon.  Despite best efforts to proofread, errors can occur which can change the meaning. Any transcriptional errors that result from this process are unintentional and may not be fully corrected at the time of dictation.

## 2022-02-16 NOTE — Patient Instructions (Signed)
You well your oxygen levels staying up on the walk today.  We are getting an overnight oxygen level.  We will see you follow-up in 4 months time call sooner should any new problems arise.

## 2022-02-20 ENCOUNTER — Encounter: Payer: Self-pay | Admitting: Family Medicine

## 2022-02-20 ENCOUNTER — Ambulatory Visit (INDEPENDENT_AMBULATORY_CARE_PROVIDER_SITE_OTHER): Payer: Medicare Other | Admitting: Family Medicine

## 2022-02-20 VITALS — BP 96/60 | HR 96 | Temp 97.5°F | Ht 65.0 in | Wt 176.0 lb

## 2022-02-20 DIAGNOSIS — I5022 Chronic systolic (congestive) heart failure: Secondary | ICD-10-CM

## 2022-02-20 DIAGNOSIS — Z95810 Presence of automatic (implantable) cardiac defibrillator: Secondary | ICD-10-CM

## 2022-02-20 DIAGNOSIS — J418 Mixed simple and mucopurulent chronic bronchitis: Secondary | ICD-10-CM

## 2022-02-20 DIAGNOSIS — J9611 Chronic respiratory failure with hypoxia: Secondary | ICD-10-CM | POA: Diagnosis not present

## 2022-02-20 DIAGNOSIS — I051 Rheumatic mitral insufficiency: Secondary | ICD-10-CM | POA: Diagnosis not present

## 2022-02-20 DIAGNOSIS — G4734 Idiopathic sleep related nonobstructive alveolar hypoventilation: Secondary | ICD-10-CM

## 2022-02-20 NOTE — Progress Notes (Unsigned)
Patient ID: Ashley [name], female    DOB: 09-26-1945, 76 y.o.   MRN: [id number]  This visit was conducted in person.  BP 96/60 (BP Location: Right Arm, Patient Position: Sitting, Cuff Size: Large)   Pulse 96   Temp (!) 97.5 F (36.4 C)   Ht '5\' 5"'$  (1.651 m)   Wt 176 lb (79.8 kg)   SpO2 98%   BMI 29.29 kg/m   BP Readings from Last 3 Encounters:  02/20/22 96/60  02/16/22 (!) 95/52  01/24/22 98/60    CC: discuss portable oxygen concentrator Subjective:   HPI: Ashley [name] is a 76 y.o. female presenting on 02/20/2022 for [employer]ants Smaller Portable Oxygen (Brought a form from [place] to help with payment)   Known systolic CHF due to NICM s/p CRT-D placement latest EF 25-30%, asthma, fibromyalgia.   Most recently restarted supplemental oxygen use after hospitalization COVID infection 10/2020, with subsequent hospitalization 12/2021 for CHF exacerbation diuresed with IV bumex, discharged on oral torsemide '10mg'$  QOD. Her entresto was held due to hypotension. Discharge weight 148 lbs. She is now on torsemide '10mg'$  QOD, spironolactone '25mg'$  daily, losartan 12.'5mg'$  daily, carvedilol 3.'125mg'$  bid.  Most recently saw Dr [name] pulm - planned overnight oxygen test. Did well with ambulatory pulse ox. Low oxygen levels due to CHF, not asthma. She is on Qvar 2 puffs BID.   She continues oxygen use consistently at night time, as well as occasionally during the day when she notes oxygen levels drop down.   Medicare won't approve [name] portable tank. BCBS may cover this .      Relevant past medical, surgical, family and social history reviewed and updated as indicated. Interim medical history since our last visit reviewed. Allergies and medications reviewed and updated. Outpatient Medications Prior to Visit  Medication Sig Dispense Refill   acetaminophen (TYLENOL) 500 MG tablet Take 500 mg by mouth daily as needed for moderate pain or headache (PAIN).     aspirin EC 81 MG tablet Take 81 mg by  mouth at bedtime.     beclomethasone (QVAR) 80 MCG/ACT inhaler Inhale 2 puffs into the lungs 2 (two) times daily. 1 each 5   carvedilol (COREG) 6.25 MG tablet TAKE 0.5 TABLETS (3.125 MG TOTAL) BY MOUTH 2 (TWO) TIMES DAILY WITH A MEAL. 90 tablet 3   Cholecalciferol (VITAMIN D3) 50 MCG (2000 UT) capsule Take 1 capsule (2,000 Units total) by mouth daily.     ezetimibe (ZETIA) 10 MG tablet TAKE 1 TABLET BY MOUTH EVERY DAY 90 tablet 3   guaiFENesin (MUCINEX) 600 MG 12 hr tablet Take 1 tablet (600 mg total) by mouth 2 (two) times daily as needed for to loosen phlegm.     levalbuterol (XOPENEX HFA) 45 MCG/ACT inhaler Inhale 2 puffs into the lungs every 6 (six) hours as needed for wheezing or shortness of breath. 15 g 3   losartan (COZAAR) 25 MG tablet Take 0.5 tablets (12.5 mg total) by mouth at bedtime. 45 tablet 3   Menthol, Topical Analgesic, (BIOFREEZE ROLL-ON EX) Apply 1 Application topically as needed (For shoulder pain).     miconazole (MICOTIN) 2 % powder Apply 1 application  topically as needed (yeast).     nystatin cream (MYCOSTATIN) Apply 1 application. topically as needed for dry skin (yeast infection). 30 g 3   OXYGEN Inhale 2 L into the lungs at bedtime as needed (Shortness of breath).     potassium chloride (KLOR-CON) 10 MEQ tablet Take 1 tablet (10  mEq total) by mouth every other day. 45 tablet 1   spironolactone (ALDACTONE) 25 MG tablet Take 1 tablet (25 mg total) by mouth daily. Start taking on 01/10/22 90 tablet 1   torsemide (DEMADEX) 10 MG tablet Take 1 tablet (10 mg total) by mouth every other day. Take extra torsemide 10 mg with potassium as needed for weight >174 45 tablet 1   No facility-administered medications prior to visit.     Per HPI unless specifically indicated in ROS section below Review of Systems  Objective:  BP 96/60 (BP Location: Right Arm, Patient Position: Sitting, Cuff Size: Large)   Pulse 96   Temp (!) 97.5 F (36.4 C)   Ht '5\' 5"'$  (1.651 m)   Wt 176 lb  (79.8 kg)   SpO2 98%   BMI 29.29 kg/m   Wt Readings from Last 3 Encounters:  02/20/22 176 lb (79.8 kg)  02/16/22 177 lb (80.3 kg)  01/24/22 177 lb 6 oz (80.5 kg)      Physical Exam    Results for orders placed or performed in visit on 02/14/22  CUP PACEART REMOTE DEVICE CHECK  Result Value Ref Range   Date Time Interrogation Session [id number]    Pulse Generator Manufacturer MERM    Pulse Gen Model [id number] Claria MRI CRT-D    Pulse Gen Serial Number [id number]    Clinic Name [facility]    Implantable Pulse Generator Type Cardiac Resynch Therapy Defibulator    Implantable Pulse Generator Implant Date [id number]    Implantable Lead Manufacturer MERM    Implantable Lead Model 3830 SelectSecure MRI SureScan    Implantable Lead Serial Number [id number]    Implantable Lead Implant Date 42595638    Implantable Lead Location Detail 1 UNKNOWN    Implantable Lead Location [id number]    Implantable Lead Manufacturer MERM    Implantable Lead Model 5076 CapSureFix Novus MRI SureScan    Implantable Lead Serial Number [id number]    Implantable Lead Implant Date [id number]    Implantable Lead Location Detail 1 APPENDAGE    Implantable Lead Location [id number]    Implantable Lead Manufacturer [facility]    Implantable Lead Model 6935 Sprint Quattro Secure S    Implantable Lead Serial Number [id number]    Implantable Lead Implant Date 51884166    Implantable Lead Location Detail 1 APEX    Implantable Lead Location [id number]    Lead Channel Setting Sensing Sensitivity 0.3 mV   Lead Channel Setting Pacing Amplitude 1.5 V   Lead Channel Setting Pacing Pulse Width 0.4 ms   Lead Channel Setting Pacing Amplitude 2.5 V   Lead Channel Setting Pacing Pulse Width 0.8 ms   Lead Channel Setting Pacing Amplitude 2 V   Lead Channel Setting Pacing Capture Mode Adaptive Capture    Lead Channel Impedance Value 380 ohm   Lead Channel Sensing Intrinsic Amplitude 2.125 mV   Lead Channel Sensing Intrinsic Amplitude  2.125 mV   Lead Channel Pacing Threshold Amplitude 0.375 V   Lead Channel Pacing Threshold Pulse Width 0.4 ms   Lead Channel Impedance Value 399 ohm   Lead Channel Impedance Value 342 ohm   Lead Channel Sensing Intrinsic Amplitude 6.375 mV   Lead Channel Sensing Intrinsic Amplitude 6.375 mV   Lead Channel Pacing Threshold Amplitude 0.625 V   Lead Channel Pacing Threshold Pulse Width 0.4 ms   HighPow Impedance 67 ohm   Lead Channel Impedance Value 437 ohm   Lead Channel Impedance Value 304 ohm   Lead  Channel Impedance Value 247 ohm   Lead Channel Pacing Threshold Amplitude 1.375 V   Lead Channel Pacing Threshold Pulse Width 0.4 ms   Battery Status OK    Battery Remaining Longevity 28 mo   Battery Voltage 2.94 V   Brady Statistic RA Percent Paced 0.21 %   Brady Statistic RV Percent Paced 96.47 %   Brady Statistic AP VP Percent 0.16 %   Brady Statistic AS VP Percent 97.5 %   Brady Statistic AP VS Percent 0.05 %   Brady Statistic AS VS Percent 2.29 %   *Note: Due to a large number of results and/or encounters for the requested time period, some results have not been displayed. A complete set of results can be found in Results Review.    Assessment & Plan:   Problem List Items Addressed This Visit   None    No orders of the defined types were placed in this encounter.  No orders of the defined types were placed in this encounter.    ***Follow up plan: No follow-ups on file.  [name], MD

## 2022-02-20 NOTE — Patient Instructions (Addendum)
Form for [facility] as well as portable oxygen concentrator prescription filled out today.  Good to see you today.  Return in 2-3 months for medicare wellness visit

## 2022-02-21 NOTE — Assessment & Plan Note (Addendum)
Followed by pulmonology [name]) - treating more as asthma than COPD, with daily Qvar inhaled corticosteroid and PRN albuterol. She states she has not tolerated spiriva well in the past.  She continues nocturnal oxygen supplementation

## 2022-02-21 NOTE — Assessment & Plan Note (Signed)
Uses supplemental nocturnal oxygen regularly as well as daytime oxygen PRN.  Requests Rx for Inogen portable oxygen concentrator to see if insurance will help cover this.  She feels her mobility is limited with current oxygen tank she needs to wear PRN when she leaves the house.

## 2022-02-21 NOTE — Assessment & Plan Note (Addendum)
Predominant cause of her chronic respiratory failure.  Appreciate cardiology and advanced CHF clinic care. Continue torsemide QOD, spironolactone, and low dose carvedilol and losartan. BP limits med titration.

## 2022-02-21 NOTE — Assessment & Plan Note (Signed)
Chronic issue, overall stable.

## 2022-02-27 ENCOUNTER — Encounter: Payer: Self-pay | Admitting: Family Medicine

## 2022-02-27 ENCOUNTER — Telehealth: Payer: Self-pay

## 2022-02-27 ENCOUNTER — Ambulatory Visit (INDEPENDENT_AMBULATORY_CARE_PROVIDER_SITE_OTHER): Payer: Medicare Other | Admitting: Family Medicine

## 2022-02-27 VITALS — BP 118/64 | HR 87 | Temp 97.7°F | Ht 65.0 in | Wt 175.5 lb

## 2022-02-27 DIAGNOSIS — G8929 Other chronic pain: Secondary | ICD-10-CM

## 2022-02-27 DIAGNOSIS — J9611 Chronic respiratory failure with hypoxia: Secondary | ICD-10-CM

## 2022-02-27 DIAGNOSIS — R1030 Lower abdominal pain, unspecified: Secondary | ICD-10-CM

## 2022-02-27 DIAGNOSIS — R1084 Generalized abdominal pain: Secondary | ICD-10-CM

## 2022-02-27 DIAGNOSIS — Z8719 Personal history of other diseases of the digestive system: Secondary | ICD-10-CM | POA: Diagnosis not present

## 2022-02-27 DIAGNOSIS — N182 Chronic kidney disease, stage 2 (mild): Secondary | ICD-10-CM | POA: Diagnosis not present

## 2022-02-27 DIAGNOSIS — R197 Diarrhea, unspecified: Secondary | ICD-10-CM | POA: Diagnosis not present

## 2022-02-27 LAB — CBC WITH DIFFERENTIAL/PLATELET
Basophils Absolute: 0.1 10*3/uL (ref 0.0–0.1)
Basophils Relative: 1.1 % (ref 0.0–3.0)
Eosinophils Absolute: 0.1 10*3/uL (ref 0.0–0.7)
Eosinophils Relative: 1.5 % (ref 0.0–5.0)
HCT: 36.8 % (ref 36.0–46.0)
Hemoglobin: 12.2 g/dL (ref 12.0–15.0)
Lymphocytes Relative: 20.7 % (ref 12.0–46.0)
Lymphs Abs: 2 10*3/uL (ref 0.7–4.0)
MCHC: 33 g/dL (ref 30.0–36.0)
MCV: 90.1 fl (ref 78.0–100.0)
Monocytes Absolute: 1 10*3/uL (ref 0.1–1.0)
Monocytes Relative: 10.3 % (ref 3.0–12.0)
Neutro Abs: 6.5 10*3/uL (ref 1.4–7.7)
Neutrophils Relative %: 66.4 % (ref 43.0–77.0)
Platelets: 219 10*3/uL (ref 150.0–400.0)
RBC: 4.09 Mil/uL (ref 3.87–5.11)
RDW: 13 % (ref 11.5–15.5)
WBC: 9.8 10*3/uL (ref 4.0–10.5)

## 2022-02-27 LAB — COMPREHENSIVE METABOLIC PANEL
ALT: 20 U/L (ref 0–35)
AST: 18 U/L (ref 0–37)
Albumin: 4.5 g/dL (ref 3.5–5.2)
Alkaline Phosphatase: 105 U/L (ref 39–117)
BUN: 18 mg/dL (ref 6–23)
CO2: 23 mEq/L (ref 19–32)
Calcium: 10 mg/dL (ref 8.4–10.5)
Chloride: 103 mEq/L (ref 96–112)
Creatinine, Ser: 1.02 mg/dL (ref 0.40–1.20)
GFR: 53.4 mL/min — ABNORMAL LOW (ref >60.00)
Glucose, Bld: 91 mg/dL (ref 70–99)
Potassium: 4.4 mEq/L (ref 3.5–5.1)
Sodium: 139 mEq/L (ref 135–145)
Total Bilirubin: 0.4 mg/dL (ref 0.2–1.2)
Total Protein: 7.6 g/dL (ref 6.0–8.3)

## 2022-02-27 LAB — LIPASE: Lipase: 22 U/L (ref 11.0–59.0)

## 2022-02-27 NOTE — Progress Notes (Signed)
Remote ICD transmission.   

## 2022-02-27 NOTE — Patient Instructions (Addendum)
Labs today. We will be in touch with results.  Pass by lab to pick up stool test.  Push fluids and rest.  Clear liquid diet for 24 hours then continue bland food.  Start pepcid (famotidine) '20mg'$  once a day.

## 2022-02-27 NOTE — Progress Notes (Unsigned)
Patient ID: Ashley [name], female    DOB: 02/03/1946, 76 y.o.   MRN: [id number]  This visit was conducted in person.  BP 118/64   Pulse 87   Temp 97.7 F (36.5 C) (Temporal)   Ht '5\' 5"'$  (1.651 m)   Wt 175 lb 8 oz (79.6 kg)   SpO2 96%   BMI 29.20 kg/m    CC: diarrhea, abd pain  Subjective:   HPI: Ashley [name] is a 76 y.o. female presenting on 02/27/2022 for Diarrhea (C/o diarrhea and abd pain for a week. Denies any nausea vomiting.  Also, c/o blood when wiping from hemorrhoids. Also, dysphagia after eating.  Food feels like to food gets stuck in throat when trying to swallow. )   H/o HFrEF with chronic respiratory failure followed by CHF clinic on nocturnal and PRN supplemental oxygen, h/o diverticulitis complicated by abscess formation/perforation s/p partial colectomy/colostomy and subsequent takedown 2015.   1 wk h/o left lower abdominal pain, bloating, diarrhea with any PO intake - watery brown/yellow with some formed stools, associated with rectal pressure.  Also notes dysphagia with solids > liquids as well as early satiety and anorexia.  Treated at home with bland diet.  Also notes blood with wiping due to known hemorrhoids.   No fevers/chills, nausea/vomiting.   CT scan 06/2021 showed diverticulosis with uncomplicated diverticulitis of splenic flexure/proximal descending colon. Recommend colonoscopy after treatment. CT also showed large supraumbilical ventral wall hernia containing small bowel and majority of transverse colon.  GI - has seen Dr [name] at [facility] ([place]). Latest had difficulty tolerating PFPT. Last seen 04/2020. Re-established with Dr Carlean Purl 08/2021.      Relevant past medical, surgical, family and social history reviewed and updated as indicated. Interim medical history since our last visit reviewed. Allergies and medications reviewed and updated. Outpatient Medications Prior to Visit  Medication Sig Dispense Refill    acetaminophen (TYLENOL) 500 MG tablet Take 500 mg by mouth daily as needed for moderate pain or headache (PAIN).     aspirin EC 81 MG tablet Take 81 mg by mouth at bedtime.     beclomethasone (QVAR) 80 MCG/ACT inhaler Inhale 2 puffs into the lungs 2 (two) times daily. 1 each 5   carvedilol (COREG) 6.25 MG tablet TAKE 0.5 TABLETS (3.125 MG TOTAL) BY MOUTH 2 (TWO) TIMES DAILY WITH A MEAL. 90 tablet 3   Cholecalciferol (VITAMIN D3) 50 MCG (2000 UT) capsule Take 1 capsule (2,000 Units total) by mouth daily.     ezetimibe (ZETIA) 10 MG tablet TAKE 1 TABLET BY MOUTH EVERY DAY 90 tablet 3   guaiFENesin (MUCINEX) 600 MG 12 hr tablet Take 1 tablet (600 mg total) by mouth 2 (two) times daily as needed for to loosen phlegm.     levalbuterol (XOPENEX HFA) 45 MCG/ACT inhaler Inhale 2 puffs into the lungs every 6 (six) hours as needed for wheezing or shortness of breath. 15 g 3   losartan (COZAAR) 25 MG tablet Take 0.5 tablets (12.5 mg total) by mouth at bedtime. 45 tablet 3   Menthol, Topical Analgesic, (BIOFREEZE ROLL-ON EX) Apply 1 Application topically as needed (For shoulder pain).     miconazole (MICOTIN) 2 % powder Apply 1 application  topically as needed (yeast).     nystatin cream (MYCOSTATIN) Apply 1 application. topically as needed for dry skin (yeast infection). 30 g 3   OXYGEN Inhale 2 L into the lungs at bedtime as needed (Shortness of breath).  potassium chloride (KLOR-CON) 10 MEQ tablet Take 1 tablet (10 mEq total) by mouth every other day. 45 tablet 1   spironolactone (ALDACTONE) 25 MG tablet Take 1 tablet (25 mg total) by mouth daily. Start taking on 01/10/22 90 tablet 1   torsemide (DEMADEX) 10 MG tablet Take 1 tablet (10 mg total) by mouth every other day. Take extra torsemide 10 mg with potassium as needed for weight >174 45 tablet 1   bisoprolol (ZEBETA) 5 MG tablet Take 5 mg by mouth daily.     No facility-administered medications prior to visit.     Per HPI unless specifically  indicated in ROS section below Review of Systems  Objective:  BP 118/64   Pulse 87   Temp 97.7 F (36.5 C) (Temporal)   Ht '5\' 5"'$  (1.651 m)   Wt 175 lb 8 oz (79.6 kg)   SpO2 96%   BMI 29.20 kg/m   Wt Readings from Last 3 Encounters:  02/27/22 175 lb 8 oz (79.6 kg)  02/20/22 176 lb (79.8 kg)  02/16/22 177 lb (80.3 kg)      Physical Exam Vitals and nursing note reviewed.  Constitutional:      Appearance: Normal appearance. She is not ill-appearing.  Cardiovascular:     Rate and Rhythm: Normal rate and regular rhythm.     Pulses: Normal pulses.     Heart sounds: Normal heart sounds. No murmur heard. Pulmonary:     Effort: Pulmonary effort is normal. No respiratory distress.     Breath sounds: Normal breath sounds. No wheezing, rhonchi or rales.  Abdominal:     General: Bowel sounds are normal. There is no distension.     Palpations: Abdomen is soft. There is no mass.     Tenderness: There is abdominal tenderness (mild-mod) in the right upper quadrant, right lower quadrant, epigastric area and left lower quadrant. There is no guarding or rebound. Negative signs include Murphy's sign.     Hernia: No hernia is present.  Musculoskeletal:     Right lower leg: No edema.     Left lower leg: No edema.  Skin:    General: Skin is warm and dry.     Findings: No rash.  Neurological:     Mental Status: She is alert.  Psychiatric:        Mood and Affect: Mood normal.        Behavior: Behavior normal.       Results for orders placed or performed in visit on 02/14/22  CUP PACEART REMOTE DEVICE CHECK  Result Value Ref Range   Date Time Interrogation Session [id number]    Pulse Generator Manufacturer MERM    Pulse Gen Model [id number] Claria MRI CRT-D    Pulse Gen Serial Number [id number]    Clinic Name [facility]    Implantable Pulse Generator Type Cardiac Resynch Therapy Defibulator    Implantable Pulse Generator Implant Date [id number]    Implantable Lead Manufacturer MERM     Implantable Lead Model 3830 SelectSecure MRI SureScan    Implantable Lead Serial Number [id number]    Implantable Lead Implant Date [id number]    Implantable Lead Location Detail 1 UNKNOWN    Implantable Lead Location [id number]    Implantable Lead Manufacturer MERM    Implantable Lead Model 5076 CapSureFix Novus MRI SureScan    Implantable Lead Serial Number [id number]    Implantable Lead Implant Date [id number]    Implantable Lead Location Detail 1 APPENDAGE    Implantable Lead  Location [phone]    Implantable Lead Manufacturer [facility]    Implantable Lead Model [id number] Sprint Quattro Secure S    Implantable Lead Serial Number [id number]    Implantable Lead Implant Date [id number]    Implantable Lead Location Detail 1 APEX    Implantable Lead Location [id number]    Lead Channel Setting Sensing Sensitivity 0.3 mV   Lead Channel Setting Pacing Amplitude 1.5 V   Lead Channel Setting Pacing Pulse Width 0.4 ms   Lead Channel Setting Pacing Amplitude 2.5 V   Lead Channel Setting Pacing Pulse Width 0.8 ms   Lead Channel Setting Pacing Amplitude 2 V   Lead Channel Setting Pacing Capture Mode Adaptive Capture    Lead Channel Impedance Value 380 ohm   Lead Channel Sensing Intrinsic Amplitude 2.125 mV   Lead Channel Sensing Intrinsic Amplitude 2.125 mV   Lead Channel Pacing Threshold Amplitude 0.375 V   Lead Channel Pacing Threshold Pulse Width 0.4 ms   Lead Channel Impedance Value 399 ohm   Lead Channel Impedance Value 342 ohm   Lead Channel Sensing Intrinsic Amplitude 6.375 mV   Lead Channel Sensing Intrinsic Amplitude 6.375 mV   Lead Channel Pacing Threshold Amplitude 0.625 V   Lead Channel Pacing Threshold Pulse Width 0.4 ms   HighPow Impedance 67 ohm   Lead Channel Impedance Value 437 ohm   Lead Channel Impedance Value 304 ohm   Lead Channel Impedance Value 247 ohm   Lead Channel Pacing Threshold Amplitude 1.375 V   Lead Channel Pacing Threshold Pulse Width 0.4 ms   Battery Status OK    Battery  Remaining Longevity 28 mo   Battery Voltage 2.94 V   Brady Statistic RA Percent Paced 0.21 %   Brady Statistic RV Percent Paced 96.47 %   Brady Statistic AP VP Percent 0.16 %   Brady Statistic AS VP Percent 97.5 %   Brady Statistic AP VS Percent 0.05 %   Brady Statistic AS VS Percent 2.29 %   *Note: Due to a large number of results and/or encounters for the requested time period, some results have not been displayed. A complete set of results can be found in Results Review.    Assessment & Plan:   Problem List Items Addressed This Visit   None    No orders of the defined types were placed in this encounter.  No orders of the defined types were placed in this encounter.    Patient Instructions  Labs today. We will be in touch with results.  Pass by lab to pick up stool test.  Push fluids and rest.  Clear liquid diet for 24 hours then continue bland food.  Start pepcid (famotidine) '20mg'$  once a day.   Follow up plan: No follow-ups on file.  [name], MD

## 2022-02-27 NOTE — Telephone Encounter (Signed)
[facility] Night - Client Nonclinical Telephone Record  [facility] Client [name] Night - Client Client Site [name] Provider [name] - MD Contact Type Call Who Is Calling Patient / Member / Family / Caregiver Caller Name [name] Phone Number [phone] Patient Name Ashley [name] Patient DOB 1946/01/19 Call Type Message Only Information Provided Reason for Call Request to Schedule Office Appointment Initial Comment Caller states she's had diarrhea for about a week, loss of appetite, and she would like to schedule an appt. Patient request to speak to RN No Additional Comment Provided office hours, declined triage. Disp. Time Disposition Final User 02/27/2022 7:34:26 AM General Information Provided Yes [name] Call Closed By: [name] Transaction Date/Time: 02/27/2022 7:28:06 AM (ET   Per appt notes pt already has appt with Dr Darnell Level 02/27/22 at 12:30. Sending note to Dr Darnell Level.

## 2022-02-28 NOTE — Assessment & Plan Note (Signed)
Awaiting [employer] portable oxygen concentrator approval.

## 2022-02-28 NOTE — Assessment & Plan Note (Addendum)
New over the past week. In h/o complicated diverticulitis, will need to rule this out. Check labs today.  Discussed clear liquid diet x 24 hours then continuing with bland diet.  Update if worsening or pending results to consider abdominal imaging. She states she does not tolerate IV contrast well.  Latest diverticulitis flare was 06/2021.

## 2022-02-28 NOTE — Assessment & Plan Note (Signed)
1 wk of watery stools with any PO intake.  She's been to the hospital in the past few months. Will r/o C diff .

## 2022-03-02 ENCOUNTER — Other Ambulatory Visit: Payer: Self-pay | Admitting: Radiology

## 2022-03-02 DIAGNOSIS — R1030 Lower abdominal pain, unspecified: Secondary | ICD-10-CM | POA: Diagnosis not present

## 2022-03-02 DIAGNOSIS — R197 Diarrhea, unspecified: Secondary | ICD-10-CM

## 2022-03-03 LAB — C. DIFFICILE GDH AND TOXIN A/B
GDH ANTIGEN: NOT DETECTED
MICRO NUMBER:: 14073997
SPECIMEN QUALITY:: ADEQUATE
TOXIN A AND B: NOT DETECTED

## 2022-03-05 ENCOUNTER — Encounter: Payer: Self-pay | Admitting: Family Medicine

## 2022-03-05 DIAGNOSIS — K5732 Diverticulitis of large intestine without perforation or abscess without bleeding: Secondary | ICD-10-CM

## 2022-03-06 ENCOUNTER — Encounter: Payer: Self-pay | Admitting: Family Medicine

## 2022-03-06 ENCOUNTER — Ambulatory Visit (INDEPENDENT_AMBULATORY_CARE_PROVIDER_SITE_OTHER): Payer: Medicare Other | Admitting: Family Medicine

## 2022-03-06 VITALS — BP 122/64 | HR 101 | Temp 97.9°F | Ht 65.0 in | Wt 177.0 lb

## 2022-03-06 DIAGNOSIS — G8929 Other chronic pain: Secondary | ICD-10-CM

## 2022-03-06 DIAGNOSIS — Z8719 Personal history of other diseases of the digestive system: Secondary | ICD-10-CM | POA: Diagnosis not present

## 2022-03-06 DIAGNOSIS — R109 Unspecified abdominal pain: Secondary | ICD-10-CM | POA: Diagnosis not present

## 2022-03-06 DIAGNOSIS — J9601 Acute respiratory failure with hypoxia: Secondary | ICD-10-CM | POA: Diagnosis not present

## 2022-03-06 DIAGNOSIS — R1084 Generalized abdominal pain: Secondary | ICD-10-CM

## 2022-03-06 LAB — POC URINALSYSI DIPSTICK (AUTOMATED)
Bilirubin, UA: NEGATIVE
Blood, UA: NEGATIVE
Glucose, UA: NEGATIVE
Ketones, UA: NEGATIVE
Nitrite, UA: NEGATIVE
Protein, UA: NEGATIVE
Spec Grav, UA: 1.02 (ref 1.010–1.025)
Urobilinogen, UA: 0.2 E.U./dL
pH, UA: 6 (ref 5.0–8.0)

## 2022-03-06 MED ORDER — ALPRAZOLAM 0.25 MG PO TABS: 0.2500 mg | ORAL_TABLET | Freq: Two times a day (BID) | ORAL | 0 refills | Status: DC | PRN

## 2022-03-06 MED ORDER — CIPROFLOXACIN HCL 500 MG PO TABS: 500.0000 mg | ORAL_TABLET | Freq: Two times a day (BID) | ORAL | 0 refills | Status: AC

## 2022-03-06 NOTE — Telephone Encounter (Signed)
Would offer imaging study with or without contrast - with will provider a clearer picture however I know she's hesitant to proceed with IV contrast. Plz call to see if she's willing to get IV contrast.

## 2022-03-06 NOTE — Addendum Note (Signed)
Addended by: [name] on: 03/06/2022 09:17 AM   Modules accepted: Orders

## 2022-03-06 NOTE — Patient Instructions (Signed)
We are getting you set up for sooner CT scan -  Arrive by 8:15am to [facility] in [place] is at 8:30am  [address], [address] Start cipro antibiotic sent to pharmacy.  Call Dr Carlean Purl to schedule another appointment at [phone] about swallowing difficulty and feeling full.  Urinalysis today

## 2022-03-06 NOTE — Telephone Encounter (Signed)
Lvm asking pt to call back.  Need to know if pt still wants to keep today's 2:00 OV or cancel, since Dr. Darnell Level is ordering CT scan.

## 2022-03-06 NOTE — Telephone Encounter (Signed)
Spoke with pt relaying Dr. Synthia Innocent message.  Pt states she saw Dr. Synthia Innocent MyChart message and agrees to CT w/o contrast.  Requests imaging asap, ok to go to [facility] or [place], but is not available 03/09/22 PM.

## 2022-03-06 NOTE — Telephone Encounter (Signed)
Patient returned call,and said that she would still like to come in today to see Dr Darnell Level.

## 2022-03-06 NOTE — Progress Notes (Signed)
Patient ID: Ashley [name], female    DOB: 1945-12-27, 76 y.o.   MRN: [id number]  This visit was conducted in person.  BP 122/64   Pulse (!) 101   Temp 97.9 F (36.6 C) (Temporal)   Ht '5\' 5"'$  (1.651 m)   Wt 177 lb (80.3 kg)   SpO2 96% Comment: 2 L O2.  BMI 29.45 kg/m    CC: follow up visit  Subjective:   HPI: Ashley [name] is a 76 y.o. female presenting on 03/06/2022 for GI Problem (C/o ongoing bowel problem.  Pt was having diarrhea, now is constipated.   Pt accompanied by husband, Herbie [place]. )   See prior note for details.   H/o HFrEF with chronic respiratory failure followed by CHF clinic on nocturnal and PRN supplemental oxygen, h/o diverticulitis complicated by abscess formation/perforation s/p partial colectomy/colostomy and subsequent takedown 2015.   Last week seen here with 1 week of left lower abdominal pain bloating and diarrhea, as well as early satiety, anorexia. Treated with clear liquid diet x 24 hours followed by advancing diet as tolerated. Labwork largely reassuring as per below. Tested negative for C diff.   Feeling worse in interim - diarrhea has changed to constipation, no appetite, early satiety, ongoing abdominal pain, and significant rectal pressure with bowel movement. Having to self disimpact due to constipation. Also notes darker foul smelling urine but no obvious dysuria or other urine change.   O2 sats have dropped down to 83% so she's started using continuous supplemental oxygen at 2L. Increased wheezing noted.  She is taking colace daily, small portion of miralax yesterday and today. She is tired of feeling bad.    Was hospitalized 12/2021 for acute on chronic combined CHF and pneumonia, treated with IV bumex and ceftriaxone.   CT scan from 06/2021 showed diverticulosis with uncomplicated diverticulitis of splenic flexure/proximal descending colon. Recommend colonoscopy after treatment. CT also showed large supraumbilical ventral wall hernia  containing small bowel and majority of transverse colon.   GI - has seen Dr [name] at [facility] ([place]) until he left the practice. Latest had difficulty tolerating PFPT. Last seen 04/2020.  Re-established with Dr Carlean Purl 08/2021, f/u left PRN.      Relevant past medical, surgical, family and social history reviewed and updated as indicated. Interim medical history since our last visit reviewed. Allergies and medications reviewed and updated. Outpatient Medications Prior to Visit  Medication Sig Dispense Refill   acetaminophen (TYLENOL) 500 MG tablet Take 500 mg by mouth daily as needed for moderate pain or headache (PAIN).     aspirin EC 81 MG tablet Take 81 mg by mouth at bedtime.     beclomethasone (QVAR) 80 MCG/ACT inhaler Inhale 2 puffs into the lungs 2 (two) times daily. 1 each 5   carvedilol (COREG) 6.25 MG tablet TAKE 0.5 TABLETS (3.125 MG TOTAL) BY MOUTH 2 (TWO) TIMES DAILY WITH A MEAL. 90 tablet 3   Cholecalciferol (VITAMIN D3) 50 MCG (2000 UT) capsule Take 1 capsule (2,000 Units total) by mouth daily.     ezetimibe (ZETIA) 10 MG tablet TAKE 1 TABLET BY MOUTH EVERY DAY 90 tablet 3   guaiFENesin (MUCINEX) 600 MG 12 hr tablet Take 1 tablet (600 mg total) by mouth 2 (two) times daily as needed for to loosen phlegm.     levalbuterol (XOPENEX HFA) 45 MCG/ACT inhaler Inhale 2 puffs into the lungs every 6 (six) hours as needed for wheezing or shortness of breath. 15  g 3   losartan (COZAAR) 25 MG tablet Take 0.5 tablets (12.5 mg total) by mouth at bedtime. 45 tablet 3   Menthol, Topical Analgesic, (BIOFREEZE ROLL-ON EX) Apply 1 Application topically as needed (For shoulder pain).     miconazole (MICOTIN) 2 % powder Apply 1 application  topically as needed (yeast).     nystatin cream (MYCOSTATIN) Apply 1 application. topically as needed for dry skin (yeast infection). 30 g 3   OXYGEN Inhale 2 L into the lungs at bedtime as needed (Shortness of breath).     potassium chloride  (KLOR-CON) 10 MEQ tablet Take 1 tablet (10 mEq total) by mouth every other day. 45 tablet 1   spironolactone (ALDACTONE) 25 MG tablet Take 1 tablet (25 mg total) by mouth daily. Start taking on 01/10/22 90 tablet 1   torsemide (DEMADEX) 10 MG tablet Take 1 tablet (10 mg total) by mouth every other day. Take extra torsemide 10 mg with potassium as needed for weight >174 45 tablet 1   No facility-administered medications prior to visit.     Per HPI unless specifically indicated in ROS section below Review of Systems  Objective:  BP 122/64   Pulse (!) 101   Temp 97.9 F (36.6 C) (Temporal)   Ht '5\' 5"'$  (1.651 m)   Wt 177 lb (80.3 kg)   SpO2 96% Comment: 2 L O2.  BMI 29.45 kg/m   Wt Readings from Last 3 Encounters:  03/06/22 177 lb (80.3 kg)  02/27/22 175 lb 8 oz (79.6 kg)  02/20/22 176 lb (79.8 kg)      Physical Exam Vitals and nursing note reviewed.  Constitutional:      Appearance: Normal appearance. She is not ill-appearing.     Comments: Tired appearing feeling unwell  HENT:     Mouth/Throat:     Mouth: Mucous membranes are moist.     Pharynx: Oropharynx is clear. No oropharyngeal exudate or posterior oropharyngeal erythema.  Eyes:     Extraocular Movements: Extraocular movements intact.     Pupils: Pupils are equal, round, and reactive to light.  Cardiovascular:     Rate and Rhythm: Normal rate and regular rhythm.     Pulses: Normal pulses.     Heart sounds: Normal heart sounds. No murmur heard. Pulmonary:     Effort: Pulmonary effort is normal. No respiratory distress.     Breath sounds: Normal breath sounds. No wheezing, rhonchi or rales.     Comments: Lungs clear without wheezing Abdominal:     General: Bowel sounds are normal. There is distension.     Palpations: There is no hepatomegaly, splenomegaly or mass.     Tenderness: There is generalized abdominal tenderness. There is guarding. There is no rebound. Negative signs include Murphy's sign.     Hernia: A  hernia is present.     Comments: Predominant discomfort at upper abdomen  Musculoskeletal:     Right lower leg: No edema.     Left lower leg: No edema.  Skin:    General: Skin is warm and dry.     Findings: No erythema or rash.  Neurological:     Mental Status: She is alert.  Psychiatric:        Mood and Affect: Mood normal.        Behavior: Behavior normal.       Results for orders placed or performed in visit on 03/06/22  POCT Urinalysis Dipstick (Automated)  Result Value Ref Range   Color,  UA yellow    Clarity, UA cloudy    Glucose, UA Negative Negative   Bilirubin, UA negative    Ketones, UA negative    Spec Grav, UA 1.020 1.010 - 1.025   Blood, UA negative    pH, UA 6.0 5.0 - 8.0   Protein, UA Negative Negative   Urobilinogen, UA 0.2 0.2 or 1.0 E.U./dL   Nitrite, UA negative    Leukocytes, UA Large (3+) (A) Negative   *Note: Due to a large number of results and/or encounters for the requested time period, some results have not been displayed. A complete set of results can be found in Results Review.   Lab Results  Component Value Date   CREATININE 1.02 02/27/2022   BUN 18 02/27/2022   NA 139 02/27/2022   K 4.4 02/27/2022   CL 103 02/27/2022   CO2 23 02/27/2022    Lab Results  Component Value Date   ALT 20 02/27/2022   AST 18 02/27/2022   ALKPHOS 105 02/27/2022   BILITOT 0.4 02/27/2022    Lab Results  Component Value Date   WBC 9.8 02/27/2022   HGB 12.2 02/27/2022   HCT 36.8 02/27/2022   MCV 90.1 02/27/2022   PLT 219.0 02/27/2022    Lab Results  Component Value Date   LIPASE 22.0 02/27/2022   Assessment & Plan:   Problem List Items Addressed This Visit     History of diverticular abscess   Generalized abdominal pain - Primary    Chronic, acute worsening over the past 2 weeks. Initial diarrhea, now constipated.  Labwork was largely reassuring last week. C diff testing was negative. Not improving with conservative measures/bowel rest.  Will  send for stat CT abd/pelvis with IV contrast and water-based oral contrast (she cannot tolerate oral barium contrast) and in interim start cipro '500mg'$  BID 7d course for possible diverticulitis in h/o complicated diverticulitis.  Pt agrees with plan.  Check UA for darker and foul smelling urine - suspicious for infection, UCx sent (Rx cipro).  I did recommend she return to GI for f/u esp in setting of newly endorsed dysphagia and early satiety.       Relevant Orders   POCT Urinalysis Dipstick (Automated) (Completed)   Urine Culture   Chronic generalized abdominal pain   Acute respiratory failure with hypoxia (HCC)    Check CXR for endorsed worsening wheezing, shortness of breath, new hypoxia.       Relevant Orders   DG Chest 2 View     Meds ordered this encounter  Medications   ALPRAZolam (XANAX) 0.25 MG tablet    Sig: Take 1 tablet (0.25 mg total) by mouth 2 (two) times daily as needed for anxiety.    Dispense:  20 tablet    Refill:  0   ciprofloxacin (CIPRO) 500 MG tablet    Sig: Take 1 tablet (500 mg total) by mouth 2 (two) times daily for 7 days.    Dispense:  14 tablet    Refill:  0   Orders Placed This Encounter  Procedures   Urine Culture   DG Chest 2 View    Standing Status:   Future    Standing Expiration Date:   03/08/2023    Order Specific Question:   Reason for Exam (SYMPTOM  OR DIAGNOSIS REQUIRED)    Answer:   increased oxygen need    Order Specific Question:   Preferred imaging location?    Answer:   [facility]  POCT Urinalysis Dipstick (Automated)     Patient Instructions  We are getting you set up for sooner CT scan -  Arrive by 8:15am to [facility] in [place] is at 8:30am  [place], [place] Start cipro antibiotic sent to pharmacy.  Call Dr Carlean Purl to schedule another appointment at [phone] about swallowing difficulty and feeling full.  Urinalysis today   Follow up plan: No follow-ups on  file.  [name], MD

## 2022-03-06 NOTE — Telephone Encounter (Addendum)
CT abd/pelvis ordered without contrast. - changed to with IV contrast , minimum amount

## 2022-03-07 ENCOUNTER — Encounter: Payer: Self-pay | Admitting: Family Medicine

## 2022-03-07 ENCOUNTER — Ambulatory Visit (INDEPENDENT_AMBULATORY_CARE_PROVIDER_SITE_OTHER)
Admission: RE | Admit: 2022-03-07 | Discharge: 2022-03-07 | Disposition: A | Discharge status: Home / Self Care | Payer: Medicare Other | Source: Ambulatory Visit | Attending: Family Medicine | Admitting: Family Medicine

## 2022-03-07 ENCOUNTER — Telehealth: Payer: Self-pay | Admitting: Family Medicine

## 2022-03-07 ENCOUNTER — Ambulatory Visit
Admission: RE | Admit: 2022-03-07 | Discharge: 2022-03-07 | Disposition: A | Discharge status: Home / Self Care | Payer: Medicare Other | Source: Ambulatory Visit | Attending: Family Medicine | Admitting: Family Medicine

## 2022-03-07 DIAGNOSIS — J90 Pleural effusion, not elsewhere classified: Secondary | ICD-10-CM | POA: Diagnosis not present

## 2022-03-07 DIAGNOSIS — I700 Atherosclerosis of aorta: Secondary | ICD-10-CM | POA: Diagnosis not present

## 2022-03-07 DIAGNOSIS — K5732 Diverticulitis of large intestine without perforation or abscess without bleeding: Secondary | ICD-10-CM | POA: Insufficient documentation

## 2022-03-07 DIAGNOSIS — J9601 Acute respiratory failure with hypoxia: Secondary | ICD-10-CM | POA: Diagnosis not present

## 2022-03-07 DIAGNOSIS — R1310 Dysphagia, unspecified: Secondary | ICD-10-CM

## 2022-03-07 DIAGNOSIS — R109 Unspecified abdominal pain: Secondary | ICD-10-CM | POA: Diagnosis not present

## 2022-03-07 DIAGNOSIS — I509 Heart failure, unspecified: Secondary | ICD-10-CM | POA: Diagnosis not present

## 2022-03-07 DIAGNOSIS — R1084 Generalized abdominal pain: Secondary | ICD-10-CM

## 2022-03-07 LAB — URINE CULTURE
MICRO NUMBER:: 14087018
Result:: NO GROWTH
SPECIMEN QUALITY:: ADEQUATE

## 2022-03-07 MED ORDER — IOHEXOL 300 MG/ML  SOLN
80.0000 mL | Freq: Once | INTRAMUSCULAR | Status: AC | PRN
  Administered 2022-03-07: 80 mL via INTRAVENOUS

## 2022-03-07 NOTE — Assessment & Plan Note (Signed)
Check CXR for endorsed worsening wheezing, shortness of breath, new hypoxia.

## 2022-03-07 NOTE — Assessment & Plan Note (Addendum)
Chronic, acute worsening over the past 2 weeks. Initial diarrhea, now constipated.  Labwork was largely reassuring last week. C diff testing was negative. Not improving with conservative measures/bowel rest.  Will send for stat CT abd/pelvis with IV contrast and water-based oral contrast (she cannot tolerate oral barium contrast) and in interim start cipro '500mg'$  BID 7d course for possible diverticulitis in h/o complicated diverticulitis.  Pt agrees with plan.  Check UA for darker and foul smelling urine - suspicious for infection, UCx sent (Rx cipro).  I did recommend she return to GI for f/u esp in setting of newly endorsed dysphagia and early satiety.

## 2022-03-07 NOTE — Telephone Encounter (Signed)
Placed order form in Dr. G's box.  

## 2022-03-07 NOTE — Telephone Encounter (Signed)
Amber form [employer] called in regarding a request for the portable oxygen concentrator. She hasnt received a response back . She would like to know if the fax was received, could it be sent back right away to fax number [phone].

## 2022-03-07 NOTE — Telephone Encounter (Signed)
Patient called to let Dr Darnell Level know that she would like the referral sent out to the GI doctor,Dr Carlean Purl.

## 2022-03-07 NOTE — Telephone Encounter (Signed)
Filled and in Lisa's box 

## 2022-03-08 ENCOUNTER — Encounter: Payer: Self-pay | Admitting: Family Medicine

## 2022-03-08 ENCOUNTER — Other Ambulatory Visit: Payer: Self-pay | Admitting: Nurse Practitioner

## 2022-03-08 ENCOUNTER — Telehealth: Payer: Self-pay | Admitting: Internal Medicine

## 2022-03-08 NOTE — Telephone Encounter (Signed)
Inbound call from patient stating that she has a referral In for Dysphagia and abdominal pain. Patient stated she needs to be seen as soon as possible because she Is not doing well and is in heart failure. Patient stated that her bowels are changing back and forth from diarrhea and constipation and when the constipation comes she has to remove the stool herself and also seeing blood as well. Patient is requesting a call back to discuss. Please advise.

## 2022-03-08 NOTE — Telephone Encounter (Signed)
Discussed in office yesterday. GI referral placed.

## 2022-03-08 NOTE — Telephone Encounter (Signed)
Faxed form.

## 2022-03-08 NOTE — Telephone Encounter (Signed)
Referral placed.

## 2022-03-09 ENCOUNTER — Ambulatory Visit (HOSPITAL_COMMUNITY)
Admission: RE | Admit: 2022-03-09 | Discharge: 2022-03-09 | Disposition: A | Discharge status: Home / Self Care | Payer: Medicare Other | Source: Ambulatory Visit | Attending: Internal Medicine | Admitting: Internal Medicine

## 2022-03-09 ENCOUNTER — Ambulatory Visit: Admission: RE | Admit: 2022-03-09 | Payer: Medicare Other | Source: Ambulatory Visit

## 2022-03-09 VITALS — BP 100/70 | HR 100 | Wt 173.6 lb

## 2022-03-09 DIAGNOSIS — I5023 Acute on chronic systolic (congestive) heart failure: Secondary | ICD-10-CM

## 2022-03-09 DIAGNOSIS — I5042 Chronic combined systolic (congestive) and diastolic (congestive) heart failure: Secondary | ICD-10-CM | POA: Diagnosis not present

## 2022-03-09 LAB — COMPREHENSIVE METABOLIC PANEL
ALT: 34 U/L (ref 0–44)
AST: 20 U/L (ref 15–41)
Albumin: 3.7 g/dL (ref 3.5–5.0)
Alkaline Phosphatase: 118 U/L (ref 38–126)
Anion gap: 12 (ref 5–15)
BUN: 23 mg/dL (ref 8–23)
CO2: 21 mmol/L — ABNORMAL LOW (ref 22–32)
Calcium: 10 mg/dL (ref 8.9–10.3)
Chloride: 105 mmol/L (ref 98–111)
Creatinine, Ser: 1.19 mg/dL — ABNORMAL HIGH (ref 0.44–1.00)
GFR, Estimated: 47 mL/min — ABNORMAL LOW (ref >60)
Glucose, Bld: 93 mg/dL (ref 70–99)
Potassium: 4.4 mmol/L (ref 3.5–5.1)
Sodium: 138 mmol/L (ref 135–145)
Total Bilirubin: 0.5 mg/dL (ref 0.3–1.2)
Total Protein: 7.7 g/dL (ref 6.5–8.1)

## 2022-03-09 LAB — IRON AND TIBC
Iron: 52 ug/dL (ref 28–170)
Saturation Ratios: 12 % (ref 10.4–31.8)
TIBC: 451 ug/dL — ABNORMAL HIGH (ref 250–450)
UIBC: 399 ug/dL

## 2022-03-09 LAB — CBC
HCT: 37.3 % (ref 36.0–46.0)
Hemoglobin: 11.9 g/dL — ABNORMAL LOW (ref 12.0–15.0)
MCH: 29.6 pg (ref 26.0–34.0)
MCHC: 31.9 g/dL (ref 30.0–36.0)
MCV: 92.8 fL (ref 80.0–100.0)
Platelets: 201 10*3/uL (ref 150–400)
RBC: 4.02 MIL/uL (ref 3.87–5.11)
RDW: 12.8 % (ref 11.5–15.5)
WBC: 10.2 10*3/uL (ref 4.0–10.5)
nRBC: 0 % (ref 0.0–0.2)

## 2022-03-09 LAB — BRAIN NATRIURETIC PEPTIDE: B Natriuretic Peptide: 1027.8 pg/mL — ABNORMAL HIGH (ref 0.0–100.0)

## 2022-03-09 NOTE — Progress Notes (Signed)
ADVANCED HF CLINIC NOTE   Primary Care: [name], MD Primary Cardiologist: Dr. [name] HF Cardiologist: Dr. [name]   Reason for Visit: F/u for chronic systolic heart failure/ NICM   HPI: Ms Tuzzolino is a 76 y.o.with a history of morbid obesity, HTN, COPD, GERD , fibromyaligia, s/p partial colon resection for diverticulitis with re-connection, MGUS, LBBB and systolic HF due to NICM s/p CRT-D referred by Dr. [name] for further evaluation of her HF.  Has h/o NICM dating back to 2012 when she had PNA October 2012 ejection fraction  25-35%,  September 2013 ejection fraction 30-35%.  EF in 2019 had recovered to 60-65% but now EF is back down to 15-20%.    Admitted 6/20 with increased dyspnea and chest pain. Prior to admit she had been treated with levaquin + prednisone for bronchitis. CXR was concerning for interstitial edema. Bedside ECHO performed in the ED showed severely reduced LVEF-->15% and severe MR. Placed on IV lasix and set up for cath. In 6/20 had RHC/LHC with normal coronaries, EF 20-25%, and mildly elevated filling pressures. Renal function has been stable. Underwent upgrade to CRT-D  Echo 10/21: EF 25%  She had COVID 7/21, started on Paxlovid. Subsequently developed diarrhea and was admitted for dehydration and AKI. Received IVFs and meds temporarily held but able to resume HF regimen. Also w/ hypoxia. D-dimer was elevated by chest CT negative for PE. Hypoxia felt 2/2 COVID and sent home w/ home O2.   Echo 8/22 showed EF 30%. RV normal.   Follow up 9/22, NYHA II-III, volume OK. Discussed possible CPX, but felt it would not change management.  Echo 5/23 EF 25-30%.  Admitted 8/23 with CAP and a/c CHF after stopping her diuretics. Diuresed with IV Bumex. Transitioned to oral torsemide. Entresto 49/51 stopped. Discharged home, weight 148 lbs.  Presented today with husband, multiple complains. Increased oxygen requirements, SOB, decreased appetite, constipation (req  manual disimpaction) vs diarrhea, hasn't been able to sleep.  Has gone from 1.5 intermittent O2 requirements to consistently being on 2L since 10/21. Saw pulmonology OP and they recommended an overnight O2 check, not yet completed. CXR and CT chest showed small bilateral pleural effusions. Dependent lung base atelectasis. No convincing pneumonia or pulmonary edema. SpO2 at home w/o O2 70s-80s recently. She usually takes Torsemide QOD, has been taking '10mg'$  BID for the last 2 days with no improvement. SOB worsens with movement, unable to walk from kitchen to living room w/o break and has been using her Albuterol more frequently. Denies CP.  ReDS 44%  Device interrogation: No VT/VF, total VP 97%, Optivol uptrending, activity .9hr/day  Cardiac Studies   - Echo (5/23): EF 25-30%  - Echo (8/22): EF 30%, RV normal   - R/LHC (6/20): normal cors LVEF 20-25.  RA 8 PCWP 21 CO 4.45 CI 2.35    - Echo (6/20): EF 15-20%. RV normal, Severe central mitral valve regurgitation.   Review of systems complete and found to be negative unless listed in HPI.   Past Medical History:  Diagnosis Date   Abdominal aortic atherosclerosis ([place]) 11/2015   by CT   Allergy    Anemia    Anxiety    Automatic implantable cardioverter-defibrillator in situ    a. 2012 s/p MDT single lead ICD; b. 10/2018 BiV upgrade-->MDT ser # [id number] H.   Bronchiectasis    a. possibly mild, treat URIs aggressively   Cancer ([place])    basal cell, arms & neck   Chronic sinusitis  COPD (chronic obstructive pulmonary disease) ([place])    COVID-19 12/06/2020   Depression    Dyspnea    Essential hypertension    Pt denies   Family history of adverse reaction to anesthesia    Mother - PONV   Fibrocystic breast disease    Fibromyalgia    GERD (gastroesophageal reflux disease)    H/O multiple allergies 10/10/2013   Hemorrhoids    HFrEF (heart failure with reduced ejection fraction) ([place])    a. 06/2011 cMRI: EF 34%, no LGE; b. 01/2016  Echo: EF 35-40%; c. 07/2016 Echo: EF 40-45%; d. 02/2020 Echo: EF 20-25%, glob HK. GrI DD. Mildly red RV fxn. Mildly dil LA. Triv MR.   History of diverticulitis of colon    History of pneumonia 2012   Insomnia    Irritable bowel syndrome    Laryngeal nodule    Dr. Thomasena Edis   Migraine without aura, without mention of intractable migraine without mention of status migrainosus    Mitral regurgitation    Monoclonal gammopathy    Neuropathy    feet and hands   Nonischemic cardiomyopathy ([place])    a. 2012 s/p single lead ICD; b. 06/2011 cMRI: EF 34%, no LGE; b. 01/2016 Echo: EF 35-40%; c. 07/2016 Echo: EF 40-45%; d. 10/2018 Cath: nl cors. EF 20-25%; e. 10/2018 s/p BiV MDT ICD upgrade; f. 02/2020 Echo: EF 20-25%, glob HK. GrI DD.   Osteoarthritis    knees, back, hands, low back,    Osteoporosis 01/2013   T-2.7 spine (01/2016)   PONV (postoperative nausea and vomiting)    sigmoid diverticulitis with perforation, abscess and fistula 09/30/2013   Sleep apnea 2010   borderline, inconclusive- /w [place]    Current Outpatient Medications  Medication Sig Dispense Refill   acetaminophen (TYLENOL) 500 MG tablet Take 500 mg by mouth daily as needed for moderate pain or headache (PAIN).     ALPRAZolam (XANAX) 0.25 MG tablet Take 1 tablet (0.25 mg total) by mouth 2 (two) times daily as needed for anxiety. 20 tablet 0   aspirin EC 81 MG tablet Take 81 mg by mouth at bedtime.     beclomethasone (QVAR) 80 MCG/ACT inhaler Inhale 2 puffs into the lungs 2 (two) times daily. 1 each 5   carvedilol (COREG) 6.25 MG tablet TAKE 0.5 TABLETS (3.125 MG TOTAL) BY MOUTH 2 (TWO) TIMES DAILY WITH A MEAL. 90 tablet 3   Cholecalciferol (VITAMIN D3) 50 MCG (2000 UT) capsule Take 1 capsule (2,000 Units total) by mouth daily.     ciprofloxacin (CIPRO) 500 MG tablet Take 1 tablet (500 mg total) by mouth 2 (two) times daily for 7 days. 14 tablet 0   ezetimibe (ZETIA) 10 MG tablet TAKE 1 TABLET BY MOUTH EVERY DAY 90 tablet 3    guaiFENesin (MUCINEX) 600 MG 12 hr tablet Take 1 tablet (600 mg total) by mouth 2 (two) times daily as needed for to loosen phlegm.     levalbuterol (XOPENEX HFA) 45 MCG/ACT inhaler Inhale 2 puffs into the lungs every 6 (six) hours as needed for wheezing or shortness of breath. 15 g 3   losartan (COZAAR) 25 MG tablet Take 0.5 tablets (12.5 mg total) by mouth at bedtime. 45 tablet 3   Menthol, Topical Analgesic, (BIOFREEZE ROLL-ON EX) Apply 1 Application topically as needed (For shoulder pain).     miconazole (MICOTIN) 2 % powder Apply 1 application  topically as needed (yeast).     nystatin cream (MYCOSTATIN) Apply 1 application. topically  as needed for dry skin (yeast infection). 30 g 3   OXYGEN Inhale 2 L into the lungs at bedtime as needed (Shortness of breath).     potassium chloride (KLOR-CON) 10 MEQ tablet Take 1 tablet (10 mEq total) by mouth every other day. 45 tablet 1   spironolactone (ALDACTONE) 25 MG tablet TAKE 1 TABLET (25 MG TOTAL) BY MOUTH DAILY. 90 tablet 1   torsemide (DEMADEX) 10 MG tablet Take 1 tablet (10 mg total) by mouth every other day. Take extra torsemide 10 mg with potassium as needed for weight >174 45 tablet 1   No current facility-administered medications for this encounter.   Allergies  Allergen Reactions   Chlorhexidine Other (See Comments)    blisters   Codeine Other (See Comments)    Confusion and dizziness   Cyclobenzaprine Other (See Comments)    Adverse reaction - back and throat pain, dizziness, exhaustion   Cymbalta [Duloxetine Hcl] Other (See Comments)    "body spasms and made me feel weird in my head"    Diflucan [Fluconazole] Nausea And Vomiting   Dilaudid [Hydromorphone Hcl] Other (See Comments)    Feels like she is burning internally    Influenza Vac Split [Influenza Virus Vaccine] Other (See Comments)    "allergic to concentrated eggs that are in vaccine"   Lasix [Furosemide] Other (See Comments)    Severe hypotension on lasix 40 PO (can  handle 20 mg oral dosing) 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    Latex Nausea Only, Rash and Other (See Comments)    Pain and infection   Lipitor [Atorvastatin] Other (See Comments)    Severe muscle cramps to generic atorvastatin.  Able to take brand lipitor.   Morphine And Related Other (See Comments)    Internally feels like she is on fire    Pravastatin Other (See Comments)    myalgias   Prevacid [Lansoprazole] Other (See Comments)    Worsened GI side effects   Prevnar [Pneumococcal 13-Val Conj Vacc] Other (See Comments)    Egg allergy Bad reaction after shot   Rosuvastatin Other (See Comments)    Was not effective controlling lipids   Simvastatin Other (See Comments)    Leg cramps   Tape Other (See Comments) and Rash    All adhesives  - Blisters, itching and burning  blistering   Farxiga [Dapagliflozin] Other (See Comments)    Unknown reaction    Metaxalone Other (See Comments)    Unknown reaction    Tramadol     Leg cramps   Antihistamines, Chlorpheniramine-Type Other (See Comments)    Alters vision   Betadine [Povidone Iodine] Rash    Per pt.    Onion Diarrhea and Other (See Comments)    GI distress, flared IBS   Other Hives and Rash    NOTE: pt is able to take cephalosporins without reaction   Penicillins Other (See Comments)    CHILDHOOD ALLEGY/REACTION: "told 50 years ago I couldn't take it; might be immune to it", able to take amoxicillin Has patient had a PCN reaction causing immediate rash, facial/tongue/throat swelling, SOB or lightheadedness with hypotension: Unknown Has patient had a PCN reaction causing severe rash involving mucus membranes or skin necrosis: UnknowN Has patient had a PCN reaction that required hospitalization: /Unknown Has patient had a PCN reaction occurring within the last 10 years: Unknown If a   [place] Nausea Only and Swelling   Sertraline Other (See Comments)    Overly sedating   Sulfasalazine Other (  See Comments)    "makes my whole body smell  like sulfa & makes me sick just smelling it"    Social History   Socioeconomic History   Marital status: Married    Spouse name: Not on file   Number of children: 1   Years of education: Not on file   Highest education level: Not on file  Occupational History   Occupation: Oncologist   Occupation: Aeronautical engineer  Tobacco Use   Smoking status: Never   Smokeless tobacco: Never   Tobacco comments:    Lived with smokers x 23 yrs, smoked herself "for a week"  Vaping Use   Vaping Use: Never used  Substance and Sexual Activity   Alcohol use: No    Alcohol/week: 0.0 standard drinks of alcohol   Drug use: No   Sexual activity: Not Currently  Other Topics Concern   Not on file  Social History Narrative   Lives with husband Herbie [place]   1 adopted child   Social Determinants of Health   Financial Resource Strain: Medium Risk (05/24/2021)   Overall Financial Resource Strain (CARDIA)    Difficulty of Paying Living Expenses: Somewhat hard  Food Insecurity: No Food Insecurity (12/01/2019)   Hunger Vital Sign    Worried About Running Out of Food in the Last Year: Never true    [place] in the Last Year: Never true  Transportation Needs: No Transportation Needs (02/15/2022)   PRAPARE - Hydrologist (Medical): No    Lack of Transportation (Non-Medical): No  Physical Activity: Insufficiently Active (12/01/2019)   Exercise Vital Sign    Days of Exercise per Week: 3 days    Minutes of Exercise per Session: 30 min  Stress: Stress Concern Present (12/01/2019)   [place]    Feeling of Stress : Rather much  Social Connections: Not on file  Intimate Partner Violence: Not At Risk (12/01/2019)   Humiliation, Afraid, Rape, and Kick questionnaire    Fear of Current or Ex-Partner: No    Emotionally Abused: No    Physically Abused: No    Sexually Abused: No    Family History  Problem Relation Age of Onset   Lung cancer Father        + smoker   Colon polyps Father    Dementia Mother    Osteoporosis Mother        Lumbar spine   Stroke Mother        x 5 @ 14 YOA   Arthritis Mother        hands   Emphysema Mother    Alcohol abuse Paternal Grandfather    Stroke Paternal Grandfather        ?   Heart disease Paternal Grandfather    Hypertension Maternal Grandfather        ?   Heart disease Paternal Grandmother    Colon cancer Neg Hx    Esophageal cancer Neg Hx    Gallbladder disease Neg Hx    BP 100/70   Pulse 100   Wt 78.7 kg (173 lb 9.6 oz)   SpO2 98%   BMI 28.89 kg/m   Wt Readings from Last 3 Encounters:  03/09/22 78.7 kg (173 lb 9.6 oz)  03/06/22 80.3 kg (177 lb)  02/27/22 79.6 kg (175 lb 8 oz)   PHYSICAL EXAM: General:  well appearing.  No respiratory difficulty HEENT: on  supplemental O2 Neck: supple. JVD around 12. Carotids 2+ bilat; no bruits. No lymphadenopathy or thyromegaly appreciated. Cor: PMI nondisplaced. Regular rate & rhythm. No rubs, gallops or murmurs. Lungs: clear, diminished at bases Abdomen: soft, nontender, nondistended. No hepatosplenomegaly. No bruits or masses. Good bowel sounds. Extremities: no cyanosis, clubbing, rash, non-pitting BLE edema  Neuro: alert & oriented x 3, cranial nerves grossly intact. moves all 4 extremities w/o difficulty. Affect pleasant.   [employer] Device interrogation: No VT/VF, total VP 97%, Optivol uptrending, activity .9hr/day (Personally reviewed)    ReDS clip: 44%  ASSESSMENT & PLAN:  1. Chronic Systolic Heart Failure - h/o NICM dating back to 2012 with fluctuating EF. EF improved to 60% in 2019 but then dropped again  - Echo 6/20 EF 20%  - L/RHC 6/20 with normal coronaries and mildly elevated filling pressures.  - Underwent CRT-D upgrade 6/20 but EF has not improved - Echo 10/21 EF 25% - Echo 8/22 EF 30%, RV normal  - Echo 5/23 EF 25-30%. - NYHA IIIb. Volume  status elevated on exam. ReDS clip 44% - Continue losartan 12.5 mg q hs (no BP room for Entresto yet, previously on 49/51) - Hold torsemide 10 mg QOD. Will give Furoscix for 3 doses. To resume torsemide Sunday.  - Continue carvedilol 3.125 mg bid. - Continue spiro 25 mg daily - Did not tolerate Wilder Glade  - Encouraged to work on increasing physical activity. Previously discussed CPX testing but feel that this is not warranted yet.  - Will check CMET, anemia panel, CBC, and BNP today - Echo ordered for tomorrow - Close f/u early next week w/ APP. If still symptomatic/volume overloaded after diureses will need L/RHC to access CO/CI, may be low-output? if no improvement  Earnie Larsson AGACNP-BC 3:35 PM  Patient seen and examined with the above-signed Advanced Practice Provider and/or Housestaff. I personally reviewed laboratory data, imaging studies and relevant notes. I independently examined the patient and formulated the important aspects of the plan. I have edited the note to reflect any of my changes or salient points. I have personally discussed the plan with the patient and/or family.  She is markedly symptomatic. With NYHA IIIB/IV symptoms. Se is volume overloaded despite poor po intake. ReDS 44%, ICD interrogated. No VT/AF. Optivol up but starting to improve  General:  Weak appearing. No resp difficulty HEENT: normal Neck: supple. JVP 8-9 Carotids 2+ bilat; no bruits. No lymphadenopathy or thryomegaly appreciated. Cor: PMI nondisplaced. Regular rate & rhythm. No rubs, gallops or murmurs. 2-6 MR Lungs: clear Abdomen: soft, nontender, + distended. No hepatosplenomegaly. No bruits or masses. Good bowel sounds. Extremities: no cyanosis, clubbing, rash, 1+ edema Neuro: alert & orientedx3, cranial nerves grossly intact. moves all 4 extremities w/o difficulty. Affect pleasant  She is volume overloaded with potential low output physiology. She has responded only modestly to increase in  outpatient torsemide. Discussed used of SQ lasix but she was initially very hesitant because she had severe drop in BP with po lasix once in the hospital. We discussed this at length and I said that torsemide was a loop diuretic like lasix and if she was tolerating that she would likely tolerate lasix. We will plan a trial of Furoscix and get repeat echo to reassess EF and MR. If not responding to Furoscix will need to be admitted for RHC and further eval. Labs today. See back Monday.   Total time spent 45 minutes. Over half that time spent discussing above.   Glori Bickers, MD  5:37 PM

## 2022-03-09 NOTE — Progress Notes (Signed)
ReDS Vest / Clip - 03/09/22 1500       ReDS Vest / Clip   Station Marker A    Ruler Value 28    ReDS Value Range High volume overload    ReDS Actual Value 44

## 2022-03-09 NOTE — Telephone Encounter (Signed)
Pt stated that she is alternating  between Diarrhea and Constipation: Pt stated that she was seeing blood on the tissue but has not seen any in 5 days: Pt stated that she was using a little miraLAX: Last BM yesterday Pt scheduled for first available office visit with Dr. Carlean Purl on 03/21/2022 at 8:50 AM. Pt made aware: Pt verbalized understanding with all questions answered.

## 2022-03-09 NOTE — Patient Instructions (Signed)
Hold Torsemide on 10/26 10/27 & 10/28, Restart on 10/29  Labs done today, your results will be available in MyChart, we will contact you for abnormal readings.  Your physician has requested that you have an echocardiogram. Echocardiography is a painless test that uses sound waves to create images of your heart. It provides your doctor with information about the size and shape of your heart and how well your heart's chambers and valves are working. This procedure takes approximately one hour. There are no restrictions for this procedure. Please do NOT wear cologne, perfume, aftershave, or lotions (deodorant is allowed). Please arrive 15 minutes prior to your appointment time.  Your provider has order Furoscix for you. This is an on-body infuser that gives you a dose of Furosemide.   It will be shipped to your home   [employer] will call you to discuss before shipping so, PLEASE answer unknown calls  For questions regarding the device call [employer] at [phone]  Ensure you write down the time you start your infusion so that if there is a problem you will know how long the infusion lasted  Use Furoscix only AS DIRECTED by our office  Dosing Directions:   Day 1= 10/26 use one cartridge  Day 2= 10/27 use one cartridge  Day 3= 10/28 use one cartridge   Your physician recommends that you schedule a follow-up appointment on Monday.  If you have any questions or concerns before your next appointment please send [place] a message through [place] or call our office at [phone].    TO LEAVE A MESSAGE FOR THE NURSE SELECT OPTION 2, PLEASE LEAVE A MESSAGE INCLUDING: YOUR NAME DATE OF BIRTH CALL BACK NUMBER REASON FOR CALL**this is important as we prioritize the call backs  YOU WILL RECEIVE A CALL BACK THE SAME DAY AS LONG AS YOU CALL BEFORE 4:00 PM  At the [facility], you and your health needs are our priority. As part of our continuing mission to provide  you with exceptional heart care, we have created designated Provider Care Teams. These Care Teams include your primary Cardiologist (physician) and Advanced Practice Providers (APPs- Physician Assistants and Nurse Practitioners) who all work together to provide you with the care you need, when you need it.   You may see any of the following providers on your designated Care Team at your next follow up: Dr Glori Bickers Dr Loralie Champagne Dr. Roxana Hires, NP Lyda Jester, [place] [facility] [place], [place] Forestine Na, NP Audry Riles, PharmD   Please be sure to bring in all your medications bottles to every appointment.

## 2022-03-09 NOTE — Progress Notes (Signed)
Medication Samples have been provided to the patient.  Drug name: Furoscix       Strength: 80 mg        Qty: 3  LOT: [id number]  Exp.Date: 02/12/2023  Dosing instructions: as directed by clinic  The patient has been instructed regarding the correct time, dose, and frequency of taking this medication, including desired effects and most common side effects.   Juanita Laster Cleven Jansma 4:09 PM 03/09/2022

## 2022-03-10 ENCOUNTER — Ambulatory Visit (HOSPITAL_COMMUNITY)
Admission: RE | Admit: 2022-03-10 | Discharge: 2022-03-10 | Disposition: A | Discharge status: Home / Self Care | Payer: Medicare Other | Source: Ambulatory Visit | Attending: Family Medicine | Admitting: Family Medicine

## 2022-03-10 ENCOUNTER — Telehealth (HOSPITAL_COMMUNITY): Payer: Self-pay | Admitting: *Deleted

## 2022-03-10 DIAGNOSIS — I5042 Chronic combined systolic (congestive) and diastolic (congestive) heart failure: Secondary | ICD-10-CM | POA: Diagnosis not present

## 2022-03-10 DIAGNOSIS — I429 Cardiomyopathy, unspecified: Secondary | ICD-10-CM | POA: Diagnosis not present

## 2022-03-10 DIAGNOSIS — I447 Left bundle-branch block, unspecified: Secondary | ICD-10-CM | POA: Insufficient documentation

## 2022-03-10 DIAGNOSIS — I340 Nonrheumatic mitral (valve) insufficiency: Secondary | ICD-10-CM | POA: Diagnosis not present

## 2022-03-10 DIAGNOSIS — Z950 Presence of cardiac pacemaker: Secondary | ICD-10-CM | POA: Diagnosis not present

## 2022-03-10 DIAGNOSIS — I110 Hypertensive heart disease with heart failure: Secondary | ICD-10-CM | POA: Insufficient documentation

## 2022-03-10 LAB — ECHOCARDIOGRAM COMPLETE
MV M vel: 3.6 m/s
MV Peak grad: 51.8 mmHg
Radius: 0.75 cm
S' Lateral: 5 cm
Single Plane A4C EF: 29.7 %

## 2022-03-10 NOTE — Progress Notes (Signed)
  Echocardiogram 2D Echocardiogram has been performed.  Ashley [name] 03/10/2022, 9:06 AM

## 2022-03-10 NOTE — Telephone Encounter (Signed)
Pt was ordered Furoscix 03/09/22, attempted to call pt to f/u and Left message to call back

## 2022-03-13 ENCOUNTER — Telehealth (HOSPITAL_COMMUNITY): Payer: Self-pay

## 2022-03-13 ENCOUNTER — Ambulatory Visit (HOSPITAL_BASED_OUTPATIENT_CLINIC_OR_DEPARTMENT_OTHER)
Admission: RE | Admit: 2022-03-13 | Discharge: 2022-03-13 | Disposition: A | Discharge status: Home / Self Care | Payer: Medicare Other | Source: Ambulatory Visit | Attending: Adult Health | Admitting: Adult Health

## 2022-03-13 VITALS — BP 110/70 | HR 95 | Wt 172.4 lb

## 2022-03-13 DIAGNOSIS — F418 Other specified anxiety disorders: Secondary | ICD-10-CM | POA: Diagnosis not present

## 2022-03-13 DIAGNOSIS — Z7982 Long term (current) use of aspirin: Secondary | ICD-10-CM | POA: Diagnosis not present

## 2022-03-13 DIAGNOSIS — I5022 Chronic systolic (congestive) heart failure: Secondary | ICD-10-CM | POA: Insufficient documentation

## 2022-03-13 DIAGNOSIS — I110 Hypertensive heart disease with heart failure: Secondary | ICD-10-CM | POA: Diagnosis not present

## 2022-03-13 DIAGNOSIS — Z79899 Other long term (current) drug therapy: Secondary | ICD-10-CM | POA: Diagnosis not present

## 2022-03-13 DIAGNOSIS — I447 Left bundle-branch block, unspecified: Secondary | ICD-10-CM | POA: Insufficient documentation

## 2022-03-13 DIAGNOSIS — R09A2 Foreign body sensation, throat: Secondary | ICD-10-CM | POA: Diagnosis present

## 2022-03-13 DIAGNOSIS — Z8616 Personal history of COVID-19: Secondary | ICD-10-CM | POA: Diagnosis not present

## 2022-03-13 DIAGNOSIS — I472 Ventricular tachycardia, unspecified: Secondary | ICD-10-CM | POA: Diagnosis not present

## 2022-03-13 DIAGNOSIS — I272 Pulmonary hypertension, unspecified: Secondary | ICD-10-CM | POA: Diagnosis not present

## 2022-03-13 DIAGNOSIS — D472 Monoclonal gammopathy: Secondary | ICD-10-CM | POA: Insufficient documentation

## 2022-03-13 DIAGNOSIS — I340 Nonrheumatic mitral (valve) insufficiency: Secondary | ICD-10-CM

## 2022-03-13 DIAGNOSIS — I428 Other cardiomyopathies: Secondary | ICD-10-CM | POA: Insufficient documentation

## 2022-03-13 DIAGNOSIS — I700 Atherosclerosis of aorta: Secondary | ICD-10-CM | POA: Diagnosis not present

## 2022-03-13 DIAGNOSIS — I5042 Chronic combined systolic (congestive) and diastolic (congestive) heart failure: Secondary | ICD-10-CM

## 2022-03-13 DIAGNOSIS — J449 Chronic obstructive pulmonary disease, unspecified: Secondary | ICD-10-CM | POA: Insufficient documentation

## 2022-03-13 DIAGNOSIS — M797 Fibromyalgia: Secondary | ICD-10-CM | POA: Diagnosis present

## 2022-03-13 DIAGNOSIS — I509 Heart failure, unspecified: Secondary | ICD-10-CM | POA: Diagnosis not present

## 2022-03-13 DIAGNOSIS — I5023 Acute on chronic systolic (congestive) heart failure: Secondary | ICD-10-CM

## 2022-03-13 DIAGNOSIS — I255 Ischemic cardiomyopathy: Secondary | ICD-10-CM | POA: Diagnosis not present

## 2022-03-13 DIAGNOSIS — K219 Gastro-esophageal reflux disease without esophagitis: Secondary | ICD-10-CM | POA: Diagnosis present

## 2022-03-13 DIAGNOSIS — E860 Dehydration: Secondary | ICD-10-CM | POA: Diagnosis not present

## 2022-03-13 DIAGNOSIS — F419 Anxiety disorder, unspecified: Secondary | ICD-10-CM | POA: Diagnosis present

## 2022-03-13 DIAGNOSIS — Z89422 Acquired absence of other left toe(s): Secondary | ICD-10-CM | POA: Diagnosis not present

## 2022-03-13 DIAGNOSIS — R1310 Dysphagia, unspecified: Secondary | ICD-10-CM | POA: Diagnosis present

## 2022-03-13 DIAGNOSIS — E785 Hyperlipidemia, unspecified: Secondary | ICD-10-CM | POA: Diagnosis present

## 2022-03-13 DIAGNOSIS — Z8249 Family history of ischemic heart disease and other diseases of the circulatory system: Secondary | ICD-10-CM | POA: Diagnosis not present

## 2022-03-13 DIAGNOSIS — Z9981 Dependence on supplemental oxygen: Secondary | ICD-10-CM | POA: Diagnosis not present

## 2022-03-13 DIAGNOSIS — Z91040 Latex allergy status: Secondary | ICD-10-CM | POA: Diagnosis not present

## 2022-03-13 DIAGNOSIS — I088 Other rheumatic multiple valve diseases: Secondary | ICD-10-CM | POA: Diagnosis not present

## 2022-03-13 LAB — BASIC METABOLIC PANEL
Anion gap: 11 (ref 5–15)
BUN: 23 mg/dL (ref 8–23)
CO2: 24 mmol/L (ref 22–32)
Calcium: 9.7 mg/dL (ref 8.9–10.3)
Chloride: 100 mmol/L (ref 98–111)
Creatinine, Ser: 1.24 mg/dL — ABNORMAL HIGH (ref 0.44–1.00)
GFR, Estimated: 45 mL/min — ABNORMAL LOW (ref >60)
Glucose, Bld: 160 mg/dL — ABNORMAL HIGH (ref 70–99)
Potassium: 3.9 mmol/L (ref 3.5–5.1)
Sodium: 135 mmol/L (ref 135–145)

## 2022-03-13 LAB — CBC
HCT: 38.1 % (ref 36.0–46.0)
Hemoglobin: 12 g/dL (ref 12.0–15.0)
MCH: 29.8 pg (ref 26.0–34.0)
MCHC: 31.5 g/dL (ref 30.0–36.0)
MCV: 94.5 fL (ref 80.0–100.0)
Platelets: 246 10*3/uL (ref 150–400)
RBC: 4.03 MIL/uL (ref 3.87–5.11)
RDW: 12.8 % (ref 11.5–15.5)
WBC: 9.8 10*3/uL (ref 4.0–10.5)
nRBC: 0 % (ref 0.0–0.2)

## 2022-03-13 LAB — BRAIN NATRIURETIC PEPTIDE: B Natriuretic Peptide: 710.8 pg/mL — ABNORMAL HIGH (ref 0.0–100.0)

## 2022-03-13 NOTE — Telephone Encounter (Signed)
Order faxed to Furoscix

## 2022-03-13 NOTE — Patient Instructions (Addendum)
It was great to see you today! No medication changes are needed at this time.  Labs today We will only contact you if something comes back abnormal or we need to make some changes. Otherwise no news is good news!   You are scheduled for a Cardiac Catheterization and TEE on Thursday, November 2 with Dr. Glori Bickers.  1. Please arrive at the [facility] (Main Entrance A) at [facility]: [address] [place], [place] [address] at 7:00 AM (This time is two hours before your procedure to ensure your preparation). Free valet parking service is available.   Special note: Every effort is made to have your procedure done on time. Please understand that emergencies sometimes delay scheduled procedures.  2. Diet: Do not eat solid foods after midnight.  The patient may have clear liquids until 5am upon the day of the procedure.  3. Labs: Pre procedure labs done 03/13/2022  4. Medication instructions in preparation for your procedure:   Contrast Allergy: No     Stop taking, Torsemide (Demadex) Thursday, November 2,   On the morning of your procedure, take your Aspirin 81 mg and any morning medicines NOT listed above.  You may use sips of water.  5. Plan for one night stay--bring personal belongings. 6. Bring a current list of your medications and current insurance cards. 7. You MUST have a responsible person to drive you home. 8. Someone MUST be with you the first 24 hours after you arrive home or your discharge will be delayed. 9. Please wear clothes that are easy to get on and off and wear slip-on shoes.  Thank you for allowing [place] to care for you!   -- [facility]  If you have any questions or concerns before your next appointment please send [place] a message through [facility] or call our office at [phone].    TO LEAVE A MESSAGE FOR THE NURSE SELECT OPTION 2, PLEASE LEAVE A MESSAGE INCLUDING: YOUR NAME DATE OF BIRTH CALL BACK  NUMBER REASON FOR CALL**this is important as we prioritize the call backs  YOU WILL RECEIVE A CALL BACK THE SAME DAY AS LONG AS YOU CALL BEFORE 4:00 PM

## 2022-03-13 NOTE — Progress Notes (Signed)
ADVANCED HF CLINIC NOTE   Primary Care: [name], MD Primary Cardiologist: Dr. [name] HF Cardiologist: Dr. [name]   Reason for Visit: F/u for chronic systolic heart failure/ NICM   HPI: Ms Spainhower is a 76 y.o.with a history of morbid obesity, HTN, COPD, GERD , fibromyaligia, s/p partial colon resection for diverticulitis with re-connection, MGUS, LBBB and systolic HF due to NICM s/p CRT-D referred by Dr. [name] for further evaluation of her HF.  Has h/o NICM dating back to 2012 when she had PNA October 2012 ejection fraction  25-35%,  September 2013 ejection fraction 30-35%.  EF in 2019 had recovered to 60-65% but now EF is back down to 15-20%.    Admitted 6/20 with increased dyspnea and chest pain. Prior to admit she had been treated with levaquin + prednisone for bronchitis. CXR was concerning for interstitial edema. Bedside ECHO performed in the ED showed severely reduced LVEF-->15% and severe MR. Placed on IV lasix and set up for cath. In 6/20 had RHC/LHC with normal coronaries, EF 20-25%, and mildly elevated filling pressures. Renal function has been stable. Underwent upgrade to CRT-D  Echo 10/21: EF 25%  She had COVID 7/21, started on Paxlovid. Subsequently developed diarrhea and was admitted for dehydration and AKI. Received IVFs and meds temporarily held but able to resume HF regimen. Also w/ hypoxia. D-dimer was elevated by chest CT negative for PE. Hypoxia felt 2/2 COVID and sent home w/ home O2.   Echo 8/22 showed EF 30%. RV normal.   Follow up 9/22, NYHA II-III, volume OK. Discussed possible CPX, but felt it would not change management.  Echo 5/23 EF 25-30%.  Admitted 8/23 with CAP and a/c CHF after stopping her diuretics. Diuresed with IV Bumex. Transitioned to oral torsemide. Entresto 49/51 stopped. Discharged home, weight 148 lbs.  She was seen in the HF clinic 10/26 and was volume overloaded. Furoscix ordered. 02/2722. Furoscix did not work. She did not  depress the blue button to start.  Used Furoscix 10/28 & 10/29. Says she had poor urine output.   Echo 10/27 EF 20-25% Global HK, RV normal. Severe MVR  Today she returns for HF follow up with her husband. Complaining of fatigue /shortness of breath. Says she wants answers. Denies PND/Orthopnea. Appetite ok. No fever or chills. Weight at home 172-->168 pounds. Taking all medications.   Device interrogation: Fluid index well below threshold. Impedance up. Activity ~ 1hour per day. No VT. No AF.   Cardiac Studies  - Echo (5/23): EF 25-30% Global HK, RV normal, and severe MR - Echo (8/22): EF 30%, RV normal  - Echo (6/20): EF 15-20%. RV normal, Severe central mitral valve regurgitation.   - R/LHC (6/20): normal cors LVEF 20-25.  RA 8 PCWP 21 CO 4.45 CI 2.35    - Echo (6/20): EF 15-20%. RV normal, Severe central mitral valve regurgitation.   Review of systems complete and found to be negative unless listed in HPI.   Past Medical History:  Diagnosis Date   Abdominal aortic atherosclerosis ([place]) 11/2015   by CT   Allergy    Anemia    Anxiety    Automatic implantable cardioverter-defibrillator in situ    a. 2012 s/p MDT single lead ICD; b. 10/2018 BiV upgrade-->MDT ser # [id number] H.   Bronchiectasis    a. possibly mild, treat URIs aggressively   Cancer ([place])    basal cell, arms & neck   Chronic sinusitis    COPD (chronic obstructive pulmonary disease) ([place])  COVID-19 12/06/2020   Depression    Dyspnea    Essential hypertension    Pt denies   Family history of adverse reaction to anesthesia    Mother - PONV   Fibrocystic breast disease    Fibromyalgia    GERD (gastroesophageal reflux disease)    H/O multiple allergies 10/10/2013   Hemorrhoids    HFrEF (heart failure with reduced ejection fraction) ([place])    a. 06/2011 cMRI: EF 34%, no LGE; b. 01/2016 Echo: EF 35-40%; c. 07/2016 Echo: EF 40-45%; d. 02/2020 Echo: EF 20-25%, glob HK. GrI DD. Mildly red RV fxn. Mildly dil LA. Triv  MR.   History of diverticulitis of colon    History of pneumonia 2012   Insomnia    Irritable bowel syndrome    Laryngeal nodule    Dr. Thomasena Edis   Migraine without aura, without mention of intractable migraine without mention of status migrainosus    Mitral regurgitation    Monoclonal gammopathy    Neuropathy    feet and hands   Nonischemic cardiomyopathy ([place])    a. 2012 s/p single lead ICD; b. 06/2011 cMRI: EF 34%, no LGE; b. 01/2016 Echo: EF 35-40%; c. 07/2016 Echo: EF 40-45%; d. 10/2018 Cath: nl cors. EF 20-25%; e. 10/2018 s/p BiV MDT ICD upgrade; f. 02/2020 Echo: EF 20-25%, glob HK. GrI DD.   Osteoarthritis    knees, back, hands, low back,    Osteoporosis 01/2013   T-2.7 spine (01/2016)   PONV (postoperative nausea and vomiting)    sigmoid diverticulitis with perforation, abscess and fistula 09/30/2013   Sleep apnea 2010   borderline, inconclusive- /w [place]    Current Outpatient Medications  Medication Sig Dispense Refill   acetaminophen (TYLENOL) 500 MG tablet Take 500 mg by mouth daily as needed for moderate pain or headache (PAIN).     ALPRAZolam (XANAX) 0.25 MG tablet Take 1 tablet (0.25 mg total) by mouth 2 (two) times daily as needed for anxiety. 20 tablet 0   aspirin EC 81 MG tablet Take 81 mg by mouth at bedtime.     beclomethasone (QVAR) 80 MCG/ACT inhaler Inhale 2 puffs into the lungs 2 (two) times daily. 1 each 5   carvedilol (COREG) 6.25 MG tablet TAKE 0.5 TABLETS (3.125 MG TOTAL) BY MOUTH 2 (TWO) TIMES DAILY WITH A MEAL. 90 tablet 3   Cholecalciferol (VITAMIN D3) 50 MCG (2000 UT) capsule Take 1 capsule (2,000 Units total) by mouth daily.     ciprofloxacin (CIPRO) 500 MG tablet Take 1 tablet (500 mg total) by mouth 2 (two) times daily for 7 days. 14 tablet 0   ezetimibe (ZETIA) 10 MG tablet TAKE 1 TABLET BY MOUTH EVERY DAY 90 tablet 3   guaiFENesin (MUCINEX) 600 MG 12 hr tablet Take 1 tablet (600 mg total) by mouth 2 (two) times daily as needed for to loosen phlegm.      levalbuterol (XOPENEX HFA) 45 MCG/ACT inhaler Inhale 2 puffs into the lungs every 6 (six) hours as needed for wheezing or shortness of breath. 15 g 3   losartan (COZAAR) 25 MG tablet Take 0.5 tablets (12.5 mg total) by mouth at bedtime. 45 tablet 3   Menthol, Topical Analgesic, (BIOFREEZE ROLL-ON EX) Apply 1 Application topically as needed (For shoulder pain).     miconazole (MICOTIN) 2 % powder Apply 1 application  topically as needed (yeast).     nystatin cream (MYCOSTATIN) Apply 1 application. topically as needed for dry skin (yeast infection). 30 g  3   OXYGEN Inhale 2 L into the lungs at bedtime as needed (Shortness of breath).     potassium chloride (KLOR-CON) 10 MEQ tablet Take 1 tablet (10 mEq total) by mouth every other day. 45 tablet 1   spironolactone (ALDACTONE) 25 MG tablet TAKE 1 TABLET (25 MG TOTAL) BY MOUTH DAILY. 90 tablet 1   torsemide (DEMADEX) 10 MG tablet Take 1 tablet (10 mg total) by mouth every other day. Take extra torsemide 10 mg with potassium as needed for weight >174 45 tablet 1   No current facility-administered medications for this encounter.   Allergies  Allergen Reactions   Chlorhexidine Other (See Comments)    blisters   Codeine Other (See Comments)    Confusion and dizziness   Cyclobenzaprine Other (See Comments)    Adverse reaction - back and throat pain, dizziness, exhaustion   Cymbalta [Duloxetine Hcl] Other (See Comments)    "body spasms and made me feel weird in my head"    Diflucan [Fluconazole] Nausea And Vomiting   Dilaudid [Hydromorphone Hcl] Other (See Comments)    Feels like she is burning internally    Influenza Vac Split [Influenza Virus Vaccine] Other (See Comments)    "allergic to concentrated eggs that are in vaccine"   Lasix [Furosemide] Other (See Comments)    Severe hypotension on lasix 40 PO (can handle 20 mg oral dosing) 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    Latex Nausea Only, Rash and Other (See Comments)    Pain and infection   Lipitor  [Atorvastatin] Other (See Comments)    Severe muscle cramps to generic atorvastatin.  Able to take brand lipitor.   Morphine And Related Other (See Comments)    Internally feels like she is on fire    Pravastatin Other (See Comments)    myalgias   Prevacid [Lansoprazole] Other (See Comments)    Worsened GI side effects   Prevnar [Pneumococcal 13-Val Conj Vacc] Other (See Comments)    Egg allergy Bad reaction after shot   Rosuvastatin Other (See Comments)    Was not effective controlling lipids   Simvastatin Other (See Comments)    Leg cramps   Tape Other (See Comments) and Rash    All adhesives  - Blisters, itching and burning  blistering   Farxiga [Dapagliflozin] Other (See Comments)    Unknown reaction    Metaxalone Other (See Comments)    Unknown reaction    Tramadol     Leg cramps   Antihistamines, Chlorpheniramine-Type Other (See Comments)    Alters vision   Betadine [Povidone Iodine] Rash    Per pt.    Onion Diarrhea and Other (See Comments)    GI distress, flared IBS   Other Hives and Rash    NOTE: pt is able to take cephalosporins without reaction   Penicillins Other (See Comments)    CHILDHOOD ALLEGY/REACTION: "told 50 years ago I couldn't take it; might be immune to it", able to take amoxicillin Has patient had a PCN reaction causing immediate rash, facial/tongue/throat swelling, SOB or lightheadedness with hypotension: Unknown Has patient had a PCN reaction causing severe rash involving mucus membranes or skin necrosis: UnknowN Has patient had a PCN reaction that required hospitalization: /Unknown Has patient had a PCN reaction occurring within the last 10 years: Unknown If a   Red [place] Nausea Only and Swelling   Sertraline Other (See Comments)    Overly sedating   Sulfasalazine Other (See Comments)    "makes my whole body  smell like sulfa & makes me sick just smelling it"    Social History   Socioeconomic History   Marital status: Married    Spouse name:  Not on file   Number of children: 1   Years of education: Not on file   Highest education level: Not on file  Occupational History   Occupation: Oncologist   Occupation: Aeronautical engineer  Tobacco Use   Smoking status: Never   Smokeless tobacco: Never   Tobacco comments:    Lived with smokers x 23 yrs, smoked herself "for a week"  Vaping Use   Vaping Use: Never used  Substance and Sexual Activity   Alcohol use: No    Alcohol/week: 0.0 standard drinks of alcohol   Drug use: No   Sexual activity: Not Currently  Other Topics Concern   Not on file  Social History Narrative   Lives with husband Herbie [place]   1 adopted child   Social Determinants of Health   Financial Resource Strain: Medium Risk (05/24/2021)   Overall Financial Resource Strain (CARDIA)    Difficulty of Paying Living Expenses: Somewhat hard  Food Insecurity: No Food Insecurity (12/01/2019)   Hunger Vital Sign    Worried About Running Out of Food in the Last Year: Never true    [place] in the Last Year: Never true  Transportation Needs: No Transportation Needs (02/15/2022)   PRAPARE - Hydrologist (Medical): No    Lack of Transportation (Non-Medical): No  Physical Activity: Insufficiently Active (12/01/2019)   Exercise Vital Sign    Days of Exercise per Week: 3 days    Minutes of Exercise per Session: 30 min  Stress: Stress Concern Present (12/01/2019)   [place]    Feeling of Stress : Rather much  Social Connections: Not on file  Intimate Partner Violence: Not At Risk (12/01/2019)   Humiliation, Afraid, Rape, and Kick questionnaire    Fear of Current or Ex-Partner: No    Emotionally Abused: No    Physically Abused: No    Sexually Abused: No   Family History  Problem Relation Age of Onset   Lung cancer Father        + smoker   Colon polyps Father    Dementia Mother     Osteoporosis Mother        Lumbar spine   Stroke Mother        x 5 @ 63 YOA   Arthritis Mother        hands   Emphysema Mother    Alcohol abuse Paternal Grandfather    Stroke Paternal Grandfather        ?   Heart disease Paternal Grandfather    Hypertension Maternal Grandfather        ?   Heart disease Paternal Grandmother    Colon cancer Neg Hx    Esophageal cancer Neg Hx    Gallbladder disease Neg Hx    BP 110/70   Pulse 95   Wt 78.2 kg (172 lb 6.4 oz)   SpO2 95%   BMI 28.69 kg/m   Wt Readings from Last 3 Encounters:  03/13/22 78.2 kg (172 lb 6.4 oz)  03/09/22 78.7 kg (173 lb 9.6 oz)  03/06/22 80.3 kg (177 lb)   PHYSICAL EXAM: General: Walked in the clinic.  No resp difficulty HEENT: normal Neck: supple. JVP 7-8  Carotids 2+ bilat;  no bruits. No lymphadenopathy or thryomegaly appreciated. Cor: PMI nondisplaced. Regular rate & rhythm. No rubs, gallops or murmurs. Lungs: clear Abdomen: soft, nontender, nondistended. No hepatosplenomegaly. No bruits or masses. Good bowel sounds. Extremities: no cyanosis, clubbing, rash, edema Neuro: alert & orientedx3, cranial nerves grossly intact. moves all 4 extremities w/o difficulty. Affect pleasant [employer] Device interrogation:   ASSESSMENT & PLAN:  1. Chronic Systolic Heart Failure - h/o NICM dating back to 2012 with fluctuating EF. EF improved to 60% in 2019 but then dropped again  - Echo 6/20 EF 20%  - L/RHC 6/20 with normal coronaries and mildly elevated filling pressures.  - Underwent CRT-D upgrade 6/20 but EF has not improved - Echo 10/21 EF 25% - Echo 8/22 EF 30%, RV normal  - Echo 5/23 EF 25-30%. - Echo 02/2022 EF 20-25% Severe MR - NYHA IIIb-IV.  - Volume status improved. Continue torsemide.  - Continue losartan 12.5 mg q hs (no BP room for Entresto yet, previously on 49/51) - Continue carvedilol 3.125 mg bid. - Continue spiro 25 mg daily - Did not tolerate Farxiga  -Set up RHC/LHC  to further assess  hemodynmics/coronaries.  - Discussed advanced therapies. VAD coordinators mets with her today.   2. MR -Severe on ECHO  -Set up TEE to further assess. ? Clip candidacy  Check precath labs. Requesting observation after LHC/RHC/TEE due to previous complications.   Amy Clegg  NP-C 9:48 AM  Agree with above.   She is improved with diuresis with SQ lasix but still NYHA IIIB-IV. Echo with EF 20% and severe MR. Struggles with any activity.  ICD interrogated. Volume back to normal. No VT/AF. Activity level down to < 1hr/day Personally reviewed   General:  Elderly woman. No resp difficulty HEENT: normal Neck: supple. no JVD. Carotids 2+ bilat; no bruits. No lymphadenopathy or thryomegaly appreciated. Cor: PMI nondisplaced. Regular rate & rhythm. 2/6 MR. Lungs: clear Abdomen: obese soft, nontender, nondistended. No hepatosplenomegaly. No bruits or masses. Good bowel sounds. Extremities: no cyanosis, clubbing, rash, edema Neuro: alert & orientedx3, cranial nerves grossly intact. moves all 4 extremities w/o difficulty. Affect pleasant  I think she is likely close to end-stage and remains markedly symptomatic despite attainment of euvolemia. Options rather limited. We discussed MitraClip versus possible VAD. She seems to favor mTEER but would be willing to consider VAD if she were a candidate. Will have her meet with VAD coordinators today. Plan R/L cath and TEE later this week.   Total time spent 60 minutes. Over half that time spent discussing above.   Glori Bickers, MD  6:14 PM

## 2022-03-13 NOTE — H&P (View-Only) (Signed)
ADVANCED HF CLINIC NOTE   Primary Care: [name], MD Primary Cardiologist: Dr. [name] HF Cardiologist: Dr. [name]   Reason for Visit: F/u for chronic systolic heart failure/ NICM   HPI: Ashley [name] is a 76 y.o.with a history of morbid obesity, HTN, COPD, GERD , fibromyaligia, s/p partial colon resection for diverticulitis with re-connection, MGUS, LBBB and systolic HF due to NICM s/p CRT-D referred by Dr. [name] for further evaluation of her HF.  Has h/o NICM dating back to 2012 when she had PNA October 2012 ejection fraction  25-35%,  September 2013 ejection fraction 30-35%.  EF in 2019 had recovered to 60-65% but now EF is back down to 15-20%.    Admitted 6/20 with increased dyspnea and chest pain. Prior to admit she had been treated with levaquin + prednisone for bronchitis. CXR was concerning for interstitial edema. Bedside ECHO performed in the ED showed severely reduced LVEF-->15% and severe MR. Placed on IV lasix and set up for cath. In 6/20 had RHC/LHC with normal coronaries, EF 20-25%, and mildly elevated filling pressures. Renal function has been stable. Underwent upgrade to CRT-D  Echo 10/21: EF 25%  She had COVID 7/21, started on Paxlovid. Subsequently developed diarrhea and was admitted for dehydration and AKI. Received IVFs and meds temporarily held but able to resume HF regimen. Also w/ hypoxia. D-dimer was elevated by chest CT negative for PE. Hypoxia felt 2/2 COVID and sent home w/ home O2.   Echo 8/22 showed EF 30%. RV normal.   Follow up 9/22, NYHA II-III, volume OK. Discussed possible CPX, but felt it would not change management.  Echo 5/23 EF 25-30%.  Admitted 8/23 with CAP and a/c CHF after stopping her diuretics. Diuresed with IV Bumex. Transitioned to oral torsemide. Entresto 49/51 stopped. Discharged home, weight 148 lbs.  She was seen in the HF clinic 10/26 and was volume overloaded. Furoscix ordered. 02/2722. Furoscix did not work. She did not  depress the blue button to start.  Used Furoscix 10/28 & 10/29. Says she had poor urine output.   Echo 10/27 EF 20-25% Global HK, RV normal. Severe MVR  Today she returns for HF follow up with her husband. Complaining of fatigue /shortness of breath. Says she wants answers. Denies PND/Orthopnea. Appetite ok. No fever or chills. Weight at home 172-->168 pounds. Taking all medications.   Device interrogation: Fluid index well below threshold. Impedance up. Activity ~ 1hour per day. No VT. No AF.   Cardiac Studies  - Echo (5/23): EF 25-30% Global HK, RV normal, and severe MR - Echo (8/22): EF 30%, RV normal  - Echo (6/20): EF 15-20%. RV normal, Severe central mitral valve regurgitation.   - R/LHC (6/20): normal cors LVEF 20-25.  RA 8 PCWP 21 CO 4.45 CI 2.35    - Echo (6/20): EF 15-20%. RV normal, Severe central mitral valve regurgitation.   Review of systems complete and found to be negative unless listed in HPI.   Past Medical History:  Diagnosis Date   Abdominal aortic atherosclerosis ([place]) 11/2015   by CT   Allergy    Anemia    Anxiety    Automatic implantable cardioverter-defibrillator in situ    a. 2012 s/p MDT single lead ICD; b. 10/2018 BiV upgrade-->MDT ser # [id number] H.   Bronchiectasis    a. possibly mild, treat URIs aggressively   Cancer ([place])    basal cell, arms & neck   Chronic sinusitis    COPD (chronic obstructive pulmonary disease) ([place])  COVID-19 12/06/2020   Depression    Dyspnea    Essential hypertension    Pt denies   Family history of adverse reaction to anesthesia    Mother - PONV   Fibrocystic breast disease    Fibromyalgia    GERD (gastroesophageal reflux disease)    H/O multiple allergies 10/10/2013   Hemorrhoids    HFrEF (heart failure with reduced ejection fraction) ([place])    a. 06/2011 cMRI: EF 34%, no LGE; b. 01/2016 Echo: EF 35-40%; c. 07/2016 Echo: EF 40-45%; d. 02/2020 Echo: EF 20-25%, glob HK. GrI DD. Mildly red RV fxn. Mildly dil LA. Triv  MR.   History of diverticulitis of colon    History of pneumonia 2012   Insomnia    Irritable bowel syndrome    Laryngeal nodule    Dr. Thomasena Edis   Migraine without aura, without mention of intractable migraine without mention of status migrainosus    Mitral regurgitation    Monoclonal gammopathy    Neuropathy    feet and hands   Nonischemic cardiomyopathy ([place])    a. 2012 s/p single lead ICD; b. 06/2011 cMRI: EF 34%, no LGE; b. 01/2016 Echo: EF 35-40%; c. 07/2016 Echo: EF 40-45%; d. 10/2018 Cath: nl cors. EF 20-25%; e. 10/2018 s/p BiV MDT ICD upgrade; f. 02/2020 Echo: EF 20-25%, glob HK. GrI DD.   Osteoarthritis    knees, back, hands, low back,    Osteoporosis 01/2013   T-2.7 spine (01/2016)   PONV (postoperative nausea and vomiting)    sigmoid diverticulitis with perforation, abscess and fistula 09/30/2013   Sleep apnea 2010   borderline, inconclusive- /w [place]    Current Outpatient Medications  Medication Sig Dispense Refill   acetaminophen (TYLENOL) 500 MG tablet Take 500 mg by mouth daily as needed for moderate pain or headache (PAIN).     ALPRAZolam (XANAX) 0.25 MG tablet Take 1 tablet (0.25 mg total) by mouth 2 (two) times daily as needed for anxiety. 20 tablet 0   aspirin EC 81 MG tablet Take 81 mg by mouth at bedtime.     beclomethasone (QVAR) 80 MCG/ACT inhaler Inhale 2 puffs into the lungs 2 (two) times daily. 1 each 5   carvedilol (COREG) 6.25 MG tablet TAKE 0.5 TABLETS (3.125 MG TOTAL) BY MOUTH 2 (TWO) TIMES DAILY WITH A MEAL. 90 tablet 3   Cholecalciferol (VITAMIN D3) 50 MCG (2000 UT) capsule Take 1 capsule (2,000 Units total) by mouth daily.     ciprofloxacin (CIPRO) 500 MG tablet Take 1 tablet (500 mg total) by mouth 2 (two) times daily for 7 days. 14 tablet 0   ezetimibe (ZETIA) 10 MG tablet TAKE 1 TABLET BY MOUTH EVERY DAY 90 tablet 3   guaiFENesin (MUCINEX) 600 MG 12 hr tablet Take 1 tablet (600 mg total) by mouth 2 (two) times daily as needed for to loosen phlegm.      levalbuterol (XOPENEX HFA) 45 MCG/ACT inhaler Inhale 2 puffs into the lungs every 6 (six) hours as needed for wheezing or shortness of breath. 15 g 3   losartan (COZAAR) 25 MG tablet Take 0.5 tablets (12.5 mg total) by mouth at bedtime. 45 tablet 3   Menthol, Topical Analgesic, (BIOFREEZE ROLL-ON EX) Apply 1 Application topically as needed (For shoulder pain).     miconazole (MICOTIN) 2 % powder Apply 1 application  topically as needed (yeast).     nystatin cream (MYCOSTATIN) Apply 1 application. topically as needed for dry skin (yeast infection). 30 g  3   OXYGEN Inhale 2 L into the lungs at bedtime as needed (Shortness of breath).     potassium chloride (KLOR-CON) 10 MEQ tablet Take 1 tablet (10 mEq total) by mouth every other day. 45 tablet 1   spironolactone (ALDACTONE) 25 MG tablet TAKE 1 TABLET (25 MG TOTAL) BY MOUTH DAILY. 90 tablet 1   torsemide (DEMADEX) 10 MG tablet Take 1 tablet (10 mg total) by mouth every other day. Take extra torsemide 10 mg with potassium as needed for weight >174 45 tablet 1   No current facility-administered medications for this encounter.   Allergies  Allergen Reactions   Chlorhexidine Other (See Comments)    blisters   Codeine Other (See Comments)    Confusion and dizziness   Cyclobenzaprine Other (See Comments)    Adverse reaction - back and throat pain, dizziness, exhaustion   Cymbalta [Duloxetine Hcl] Other (See Comments)    "body spasms and made me feel weird in my head"    Diflucan [Fluconazole] Nausea And Vomiting   Dilaudid [Hydromorphone Hcl] Other (See Comments)    Feels like she is burning internally    Influenza Vac Split [Influenza Virus Vaccine] Other (See Comments)    "allergic to concentrated eggs that are in vaccine"   Lasix [Furosemide] Other (See Comments)    Severe hypotension on lasix 40 PO (can handle 20 mg oral dosing) 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    Latex Nausea Only, Rash and Other (See Comments)    Pain and infection   Lipitor  [Atorvastatin] Other (See Comments)    Severe muscle cramps to generic atorvastatin.  Able to take brand lipitor.   Morphine And Related Other (See Comments)    Internally feels like she is on fire    Pravastatin Other (See Comments)    myalgias   Prevacid [Lansoprazole] Other (See Comments)    Worsened GI side effects   Prevnar [Pneumococcal 13-Val Conj Vacc] Other (See Comments)    Egg allergy Bad reaction after shot   Rosuvastatin Other (See Comments)    Was not effective controlling lipids   Simvastatin Other (See Comments)    Leg cramps   Tape Other (See Comments) and Rash    All adhesives  - Blisters, itching and burning  blistering   Farxiga [Dapagliflozin] Other (See Comments)    Unknown reaction    Metaxalone Other (See Comments)    Unknown reaction    Tramadol     Leg cramps   Antihistamines, Chlorpheniramine-Type Other (See Comments)    Alters vision   Betadine [Povidone Iodine] Rash    Per pt.    Onion Diarrhea and Other (See Comments)    GI distress, flared IBS   Other Hives and Rash    NOTE: pt is able to take cephalosporins without reaction   Penicillins Other (See Comments)    CHILDHOOD ALLEGY/REACTION: "told 50 years ago I couldn't take it; might be immune to it", able to take amoxicillin Has patient had a PCN reaction causing immediate rash, facial/tongue/throat swelling, SOB or lightheadedness with hypotension: Unknown Has patient had a PCN reaction causing severe rash involving mucus membranes or skin necrosis: UnknowN Has patient had a PCN reaction that required hospitalization: /Unknown Has patient had a PCN reaction occurring within the last 10 years: Unknown If a   Red [place] Nausea Only and Swelling   Sertraline Other (See Comments)    Overly sedating   Sulfasalazine Other (See Comments)    "makes my whole body  smell like sulfa & makes me sick just smelling it"    Social History   Socioeconomic History   Marital status: Married    Spouse name:  Not on file   Number of children: 1   Years of education: Not on file   Highest education level: Not on file  Occupational History   Occupation: Oncologist   Occupation: Aeronautical engineer  Tobacco Use   Smoking status: Never   Smokeless tobacco: Never   Tobacco comments:    Lived with smokers x 23 yrs, smoked herself "for a week"  Vaping Use   Vaping Use: Never used  Substance and Sexual Activity   Alcohol use: No    Alcohol/week: 0.0 standard drinks of alcohol   Drug use: No   Sexual activity: Not Currently  Other Topics Concern   Not on file  Social History Narrative   Lives with husband Ashley [name]   1 adopted child   Social Determinants of Health   Financial Resource Strain: Medium Risk (05/24/2021)   Overall Financial Resource Strain (CARDIA)    Difficulty of Paying Living Expenses: Somewhat hard  Food Insecurity: No Food Insecurity (12/01/2019)   Hunger Vital Sign    Worried About Running Out of Food in the Last Year: Never true    [place] in the Last Year: Never true  Transportation Needs: No Transportation Needs (02/15/2022)   PRAPARE - Hydrologist (Medical): No    Lack of Transportation (Non-Medical): No  Physical Activity: Insufficiently Active (12/01/2019)   Exercise Vital Sign    Days of Exercise per Week: 3 days    Minutes of Exercise per Session: 30 min  Stress: Stress Concern Present (12/01/2019)   [place]    Feeling of Stress : Rather much  Social Connections: Not on file  Intimate Partner Violence: Not At Risk (12/01/2019)   Humiliation, Afraid, Rape, and Kick questionnaire    Fear of Current or Ex-Partner: No    Emotionally Abused: No    Physically Abused: No    Sexually Abused: No   Family History  Problem Relation Age of Onset   Lung cancer Father        + smoker   Colon polyps Father    Dementia Mother     Osteoporosis Mother        Lumbar spine   Stroke Mother        x 5 @ 55 YOA   Arthritis Mother        hands   Emphysema Mother    Alcohol abuse Paternal Grandfather    Stroke Paternal Grandfather        ?   Heart disease Paternal Grandfather    Hypertension Maternal Grandfather        ?   Heart disease Paternal Grandmother    Colon cancer Neg Hx    Esophageal cancer Neg Hx    Gallbladder disease Neg Hx    BP 110/70   Pulse 95   Wt 78.2 kg (172 lb 6.4 oz)   SpO2 95%   BMI 28.69 kg/m   Wt Readings from Last 3 Encounters:  03/13/22 78.2 kg (172 lb 6.4 oz)  03/09/22 78.7 kg (173 lb 9.6 oz)  03/06/22 80.3 kg (177 lb)   PHYSICAL EXAM: General: Walked in the clinic.  No resp difficulty HEENT: normal Neck: supple. JVP 7-8  Carotids 2+ bilat;  no bruits. No lymphadenopathy or thryomegaly appreciated. Cor: PMI nondisplaced. Regular rate & rhythm. No rubs, gallops or murmurs. Lungs: clear Abdomen: soft, nontender, nondistended. No hepatosplenomegaly. No bruits or masses. Good bowel sounds. Extremities: no cyanosis, clubbing, rash, edema Neuro: alert & orientedx3, cranial nerves grossly intact. moves all 4 extremities w/o difficulty. Affect pleasant [employer] Device interrogation:   ASSESSMENT & PLAN:  1. Chronic Systolic Heart Failure - h/o NICM dating back to 2012 with fluctuating EF. EF improved to 60% in 2019 but then dropped again  - Echo 6/20 EF 20%  - L/RHC 6/20 with normal coronaries and mildly elevated filling pressures.  - Underwent CRT-D upgrade 6/20 but EF has not improved - Echo 10/21 EF 25% - Echo 8/22 EF 30%, RV normal  - Echo 5/23 EF 25-30%. - Echo 02/2022 EF 20-25% Severe MR - NYHA IIIb-IV.  - Volume status improved. Continue torsemide.  - Continue losartan 12.5 mg q hs (no BP room for Entresto yet, previously on 49/51) - Continue carvedilol 3.125 mg bid. - Continue spiro 25 mg daily - Did not tolerate Farxiga  -Set up RHC/LHC  to further assess  hemodynmics/coronaries.  - Discussed advanced therapies. VAD coordinators mets with her today.   2. MR -Severe on ECHO  -Set up TEE to further assess. ? Clip candidacy  Check precath labs. Requesting observation after LHC/RHC/TEE due to previous complications.   Amy Clegg  NP-C 9:48 AM  Agree with above.   She is improved with diuresis with SQ lasix but still NYHA IIIB-IV. Echo with EF 20% and severe MR. Struggles with any activity.  ICD interrogated. Volume back to normal. No VT/AF. Activity level down to < 1hr/day Personally reviewed   General:  Elderly woman. No resp difficulty HEENT: normal Neck: supple. no JVD. Carotids 2+ bilat; no bruits. No lymphadenopathy or thryomegaly appreciated. Cor: PMI nondisplaced. Regular rate & rhythm. 2/6 MR. Lungs: clear Abdomen: obese soft, nontender, nondistended. No hepatosplenomegaly. No bruits or masses. Good bowel sounds. Extremities: no cyanosis, clubbing, rash, edema Neuro: alert & orientedx3, cranial nerves grossly intact. moves all 4 extremities w/o difficulty. Affect pleasant  I think she is likely close to end-stage and remains markedly symptomatic despite attainment of euvolemia. Options rather limited. We discussed MitraClip versus possible VAD. She seems to favor mTEER but would be willing to consider VAD if she were a candidate. Will have her meet with VAD coordinators today. Plan R/L cath and TEE later this week.   Total time spent 60 minutes. Over half that time spent discussing above.   Glori Bickers, MD  6:14 PM

## 2022-03-13 NOTE — Progress Notes (Signed)
MCS EDUCATION NOTE:                VAD evaluation consent reviewed and signed by Mrs Rodenbaugh and designated caregiver Mr Vandoren (husband). Initial VAD teaching completed with pt and caregiver.   Reports she has had no energy for the last 2 months. She was hospitalized in August for PNA and CHF exacerbation. States "I have not been the same since."  Using home O2-2L. Reports she has been wearing constantly since Saturday, previously would use as needed. Reports minimal activity makes her short of breath. She is unable to bend over because this "cuts my breath off". She has experienced this since partial colectomy in 2015. States she just wants to feel better. Her goal is to make it to her grand-kids graduation's, and to her 75th wedding anniversary. States "I have to make it to 76 yrs old to accomplish those things, so whatever I need to do to live I will do."   Pt shares that she has had ongoing GI issues following partial colectomy for diverticulitis in 2015. Reports she is either constipated or having "explosive" diarrhea. Reports she is scheduled for GI follow up next week to discuss need for EGD "for throat closing/difficulty swallowing" and to discuss need for repeat colonoscopy. Last colonoscopy was in 2019 per chart history. Reports she completed home test (possibly Cologuard?) recently.   VAD educational packet including "Understanding Your Options with Advanced Heart Failure", "[place] Patient Agreement for VAD Evaluation and Potential Implantation" consent, and [employer] "Heartmate 3 Left Ventricular Device (LVAD) Patient Guide",  "[place] HM III Patient Education", " Mechanical Circulatory Support Program", and "Decision Aids for Left Ventricular Assist Device" reviewed in detail and left at bedside for continued reference.   All questions answered regarding VAD implant, hospital stay, and what to expect when discharged home living with a heart pump. Pt identified her husband  Mortimer Fries as her primary caregiver if this therapy should be deemed appropriate. They have a son, but he lives in [place] with his wife and children, and would not be available to serve as a caregiver.   Explained need for 24/7 care when pt is discharged home due to sternal precautions, adaptation to living on support, emotional support, consistent and meticulous exit site care and management, medication adherence and high volume of follow up visits with the [facility] after discharge; both pt and caregiver verbalized understanding of above.   Explained that LVAD can be implanted for two indications in the setting of advanced left ventricular heart failure treatment:  Bridge to transplant - used for patients who cannot safely wait for heart transplant without this device.  Or    Destination therapy - used for patients until end of life or recovery of heart function.  Patient and caregiver(s) acknowledge that the indication at this point in time for LVAD therapy would be for destination therapy due to age.   Provided brief equipment overview and demonstration with HeartMate III training loop including discussion on the following:   a) mobile power unit b) system controller   c) universal Charity fundraiser   d) battery clips   e) Batteries   f)  Perc lock   g) Percutaneous lead   Pt shares she has fibromyalgia, arthritis, and left shoulder bursitis. Had pt hold controller with batteries/clips attached. She is concerned about her ability to carry the weight of external equipment. Discussed different methods of wearing/transporting equipment. Reports she takes "1/2 an extra strength Tylenol" for  pain management.   Reviewed and supplied a copy of home inspection check list stressing that only three pronged grounded power outlets can be used for VAD equipment. Mrs Roehrs confirmed home has electrical outlets that will support the equipment along with access working telephone.  Identified the following  lifestyle modifications while living on MCS:    1. No driving for at least three months and then only if doctor gives permission to do so.   2. No tub baths while pump implanted, and shower only when doctor gives permission.   3. No swimming or submersion in water while implanted with pump.   4. No contact sports or engaging in jumping activities.   5. Always have a backup controller, charged spare batteries, and battery clips nearby at all times in case of emergency.   6. Call the doctor or hospital contact person if any change in how the pump sounds, feels, or works.   7. Plan to sleep only when connected to the power module.   8. Do not sleep on your stomach.   9. Keep a backup system controller, charged batteries, battery clips, and flashlight near you during sleep in case of electrical power outage.   10. Exit site care including dressing changes, monitoring for infection, and importance of keeping percutaneous lead stabilized at all times.     Extended the option to have one of our current patients and caregiver(s) come to talk with them about living on support} to assist with decision making. They would like to speak with a current patient later this week follow RHC.   Reviewed pictures of VAD drive line, site care, dressing changes, and drive line stabilization including securement attachment device and abdominal binder. Discussed with pt and family that they will be required to purchase dressing supplies as long as patient has the VAD in place.    Intermacs patient survival statistics through June 2023 reviewed with patient and caregiver as follows:                                              The patient understands that from this discussion it does not mean that they will receive the device, but that depends on an extensive evaluation process. The patient is aware of the fact that if at anytime they want to stop the evaluation process they can.  All questions have been answered  at this time and contact information was provided should they encounter any further questions. They are both agreeable at this time to the evaluation process and will move forward.   Plan to admit pt following scheduled RHC/LHC/TEE on Thursday. Per Dr [name] evaluating pt for Mitraclip vs VAD. Will place VAD evaluation orders at time of admission.  Total Session Time: 65 minutes  Emerson Monte RN [place] Coordinator  Office: [phone]  24/7 Pager: [phone]

## 2022-03-14 ENCOUNTER — Other Ambulatory Visit (HOSPITAL_COMMUNITY): Payer: Self-pay

## 2022-03-14 DIAGNOSIS — I5042 Chronic combined systolic (congestive) and diastolic (congestive) heart failure: Secondary | ICD-10-CM

## 2022-03-15 ENCOUNTER — Encounter: Payer: Self-pay | Admitting: Family Medicine

## 2022-03-15 ENCOUNTER — Other Ambulatory Visit: Payer: Self-pay | Admitting: Family Medicine

## 2022-03-15 NOTE — Telephone Encounter (Signed)
Pt question brought to Dr. Tanna Furry attention;  Pt has MDT ICD;  Pt scheduled for a RT / LT Heart Catheterization with Dr. Rip Harbour, tomorrow 03/16/2022.

## 2022-03-16 ENCOUNTER — Ambulatory Visit (HOSPITAL_COMMUNITY): Payer: Medicare Other | Admitting: Anesthesiology

## 2022-03-16 ENCOUNTER — Ambulatory Visit (HOSPITAL_COMMUNITY)
Admission: RE | Admit: 2022-03-16 | Discharge: 2022-03-16 | Disposition: A | Discharge status: Home / Self Care | Payer: Medicare Other | Source: Ambulatory Visit | Attending: Internal Medicine | Admitting: Internal Medicine

## 2022-03-16 ENCOUNTER — Encounter (HOSPITAL_COMMUNITY): Payer: Self-pay | Admitting: Internal Medicine

## 2022-03-16 ENCOUNTER — Inpatient Hospital Stay (HOSPITAL_COMMUNITY): Payer: Medicare Other

## 2022-03-16 ENCOUNTER — Ambulatory Visit (HOSPITAL_COMMUNITY): Admission: RE | Admit: 2022-03-16 | Payer: Medicare Other | Source: Home / Self Care | Admitting: Internal Medicine

## 2022-03-16 ENCOUNTER — Inpatient Hospital Stay (HOSPITAL_COMMUNITY)
Admission: AD | Admit: 2022-03-16 | Discharge: 2022-03-19 | DRG: 286 | Disposition: A | Discharge status: Home / Self Care | Payer: Medicare Other | Attending: Internal Medicine | Admitting: Internal Medicine

## 2022-03-16 ENCOUNTER — Encounter (HOSPITAL_COMMUNITY)
Admission: AD | Disposition: A | Discharge status: Home / Self Care | Payer: Self-pay | Source: Home / Self Care | Attending: Internal Medicine

## 2022-03-16 DIAGNOSIS — Z91040 Latex allergy status: Secondary | ICD-10-CM

## 2022-03-16 DIAGNOSIS — Z7982 Long term (current) use of aspirin: Secondary | ICD-10-CM

## 2022-03-16 DIAGNOSIS — Z961 Presence of intraocular lens: Secondary | ICD-10-CM | POA: Diagnosis present

## 2022-03-16 DIAGNOSIS — Z91048 Other nonmedicinal substance allergy status: Secondary | ICD-10-CM

## 2022-03-16 DIAGNOSIS — I272 Pulmonary hypertension, unspecified: Secondary | ICD-10-CM | POA: Diagnosis present

## 2022-03-16 DIAGNOSIS — F418 Other specified anxiety disorders: Secondary | ICD-10-CM | POA: Diagnosis not present

## 2022-03-16 DIAGNOSIS — Z85828 Personal history of other malignant neoplasm of skin: Secondary | ICD-10-CM

## 2022-03-16 DIAGNOSIS — I428 Other cardiomyopathies: Secondary | ICD-10-CM | POA: Diagnosis present

## 2022-03-16 DIAGNOSIS — E785 Hyperlipidemia, unspecified: Secondary | ICD-10-CM | POA: Diagnosis present

## 2022-03-16 DIAGNOSIS — I088 Other rheumatic multiple valve diseases: Secondary | ICD-10-CM

## 2022-03-16 DIAGNOSIS — I110 Hypertensive heart disease with heart failure: Secondary | ICD-10-CM | POA: Diagnosis present

## 2022-03-16 DIAGNOSIS — J449 Chronic obstructive pulmonary disease, unspecified: Secondary | ICD-10-CM | POA: Diagnosis present

## 2022-03-16 DIAGNOSIS — Z89422 Acquired absence of other left toe(s): Secondary | ICD-10-CM | POA: Diagnosis not present

## 2022-03-16 DIAGNOSIS — D472 Monoclonal gammopathy: Secondary | ICD-10-CM | POA: Diagnosis present

## 2022-03-16 DIAGNOSIS — Z8261 Family history of arthritis: Secondary | ICD-10-CM

## 2022-03-16 DIAGNOSIS — I472 Ventricular tachycardia, unspecified: Secondary | ICD-10-CM | POA: Diagnosis present

## 2022-03-16 DIAGNOSIS — I5023 Acute on chronic systolic (congestive) heart failure: Secondary | ICD-10-CM | POA: Diagnosis present

## 2022-03-16 DIAGNOSIS — I5042 Chronic combined systolic (congestive) and diastolic (congestive) heart failure: Secondary | ICD-10-CM

## 2022-03-16 DIAGNOSIS — I340 Nonrheumatic mitral (valve) insufficiency: Secondary | ICD-10-CM | POA: Diagnosis present

## 2022-03-16 DIAGNOSIS — I700 Atherosclerosis of aorta: Secondary | ICD-10-CM | POA: Diagnosis present

## 2022-03-16 DIAGNOSIS — Z6827 Body mass index (BMI) 27.0-27.9, adult: Secondary | ICD-10-CM

## 2022-03-16 DIAGNOSIS — Z885 Allergy status to narcotic agent status: Secondary | ICD-10-CM

## 2022-03-16 DIAGNOSIS — F419 Anxiety disorder, unspecified: Secondary | ICD-10-CM | POA: Diagnosis present

## 2022-03-16 DIAGNOSIS — K219 Gastro-esophageal reflux disease without esophagitis: Secondary | ICD-10-CM | POA: Diagnosis present

## 2022-03-16 DIAGNOSIS — R1310 Dysphagia, unspecified: Secondary | ICD-10-CM | POA: Diagnosis present

## 2022-03-16 DIAGNOSIS — Z8616 Personal history of COVID-19: Secondary | ICD-10-CM | POA: Diagnosis not present

## 2022-03-16 DIAGNOSIS — E860 Dehydration: Secondary | ICD-10-CM | POA: Diagnosis present

## 2022-03-16 DIAGNOSIS — Z8262 Family history of osteoporosis: Secondary | ICD-10-CM

## 2022-03-16 DIAGNOSIS — Z9981 Dependence on supplemental oxygen: Secondary | ICD-10-CM | POA: Diagnosis not present

## 2022-03-16 DIAGNOSIS — Z888 Allergy status to other drugs, medicaments and biological substances status: Secondary | ICD-10-CM

## 2022-03-16 DIAGNOSIS — Z8249 Family history of ischemic heart disease and other diseases of the circulatory system: Secondary | ICD-10-CM | POA: Diagnosis not present

## 2022-03-16 DIAGNOSIS — M797 Fibromyalgia: Secondary | ICD-10-CM | POA: Diagnosis present

## 2022-03-16 DIAGNOSIS — R262 Difficulty in walking, not elsewhere classified: Secondary | ICD-10-CM | POA: Diagnosis present

## 2022-03-16 DIAGNOSIS — Z823 Family history of stroke: Secondary | ICD-10-CM

## 2022-03-16 DIAGNOSIS — Z882 Allergy status to sulfonamides status: Secondary | ICD-10-CM

## 2022-03-16 DIAGNOSIS — R09A2 Foreign body sensation, throat: Secondary | ICD-10-CM | POA: Diagnosis present

## 2022-03-16 DIAGNOSIS — Z79899 Other long term (current) drug therapy: Secondary | ICD-10-CM

## 2022-03-16 DIAGNOSIS — Z9841 Cataract extraction status, right eye: Secondary | ICD-10-CM

## 2022-03-16 DIAGNOSIS — Z825 Family history of asthma and other chronic lower respiratory diseases: Secondary | ICD-10-CM

## 2022-03-16 DIAGNOSIS — I509 Heart failure, unspecified: Secondary | ICD-10-CM

## 2022-03-16 DIAGNOSIS — Z9049 Acquired absence of other specified parts of digestive tract: Secondary | ICD-10-CM

## 2022-03-16 DIAGNOSIS — I255 Ischemic cardiomyopathy: Secondary | ICD-10-CM | POA: Diagnosis not present

## 2022-03-16 DIAGNOSIS — Z880 Allergy status to penicillin: Secondary | ICD-10-CM

## 2022-03-16 HISTORY — PX: RIGHT/LEFT HEART CATH AND CORONARY ANGIOGRAPHY: CATH118266

## 2022-03-16 HISTORY — PX: TEE WITHOUT CARDIOVERSION: SHX5443

## 2022-03-16 LAB — URINALYSIS, ROUTINE W REFLEX MICROSCOPIC
Bilirubin Urine: NEGATIVE
Glucose, UA: NEGATIVE mg/dL
Hgb urine dipstick: NEGATIVE
Ketones, ur: NEGATIVE mg/dL
Leukocytes,Ua: NEGATIVE
Nitrite: NEGATIVE
Protein, ur: NEGATIVE mg/dL
Specific Gravity, Urine: 1.01 (ref 1.005–1.030)
pH: 5 (ref 5.0–8.0)

## 2022-03-16 LAB — CBC
HCT: 37.8 % (ref 36.0–46.0)
Hemoglobin: 12.7 g/dL (ref 12.0–15.0)
MCH: 30.6 pg (ref 26.0–34.0)
MCHC: 33.6 g/dL (ref 30.0–36.0)
MCV: 91.1 fL (ref 80.0–100.0)
Platelets: 275 10*3/uL (ref 150–400)
RBC: 4.15 MIL/uL (ref 3.87–5.11)
RDW: 12.7 % (ref 11.5–15.5)
WBC: 13.4 10*3/uL — ABNORMAL HIGH (ref 4.0–10.5)
nRBC: 0 % (ref 0.0–0.2)

## 2022-03-16 LAB — POCT I-STAT EG7
Acid-Base Excess: 0 mmol/L (ref 0.0–2.0)
Acid-Base Excess: 0 mmol/L (ref 0.0–2.0)
Bicarbonate: 25.1 mmol/L (ref 20.0–28.0)
Bicarbonate: 25.7 mmol/L (ref 20.0–28.0)
Calcium, Ion: 1.29 mmol/L (ref 1.15–1.40)
Calcium, Ion: 1.29 mmol/L (ref 1.15–1.40)
HCT: 33 % — ABNORMAL LOW (ref 36.0–46.0)
HCT: 33 % — ABNORMAL LOW (ref 36.0–46.0)
Hemoglobin: 11.2 g/dL — ABNORMAL LOW (ref 12.0–15.0)
Hemoglobin: 11.2 g/dL — ABNORMAL LOW (ref 12.0–15.0)
O2 Saturation: 53 %
O2 Saturation: 56 %
Potassium: 4.6 mmol/L (ref 3.5–5.1)
Potassium: 4.6 mmol/L (ref 3.5–5.1)
Sodium: 138 mmol/L (ref 135–145)
Sodium: 139 mmol/L (ref 135–145)
TCO2: 26 mmol/L (ref 22–32)
TCO2: 27 mmol/L (ref 22–32)
pCO2, Ven: 42.9 mmHg — ABNORMAL LOW (ref 44–60)
pCO2, Ven: 43.7 mmHg — ABNORMAL LOW (ref 44–60)
pH, Ven: 7.375 (ref 7.25–7.43)
pH, Ven: 7.378 (ref 7.25–7.43)
pO2, Ven: 29 mmHg — CL (ref 32–45)
pO2, Ven: 30 mmHg — CL (ref 32–45)

## 2022-03-16 LAB — POCT I-STAT 7, (LYTES, BLD GAS, ICA,H+H)
Acid-base deficit: 4 mmol/L — ABNORMAL HIGH (ref 0.0–2.0)
Bicarbonate: 19.6 mmol/L — ABNORMAL LOW (ref 20.0–28.0)
Calcium, Ion: 1 mmol/L — ABNORMAL LOW (ref 1.15–1.40)
HCT: 28 % — ABNORMAL LOW (ref 36.0–46.0)
Hemoglobin: 9.5 g/dL — ABNORMAL LOW (ref 12.0–15.0)
O2 Saturation: 99 %
Potassium: 3.8 mmol/L (ref 3.5–5.1)
Sodium: 143 mmol/L (ref 135–145)
TCO2: 20 mmol/L — ABNORMAL LOW (ref 22–32)
pCO2 arterial: 29.2 mmHg — ABNORMAL LOW (ref 32–48)
pH, Arterial: 7.434 (ref 7.35–7.45)
pO2, Arterial: 127 mmHg — ABNORMAL HIGH (ref 83–108)

## 2022-03-16 LAB — COMPREHENSIVE METABOLIC PANEL
ALT: 27 U/L (ref 0–44)
AST: 23 U/L (ref 15–41)
Albumin: 3.9 g/dL (ref 3.5–5.0)
Alkaline Phosphatase: 92 U/L (ref 38–126)
Anion gap: 17 — ABNORMAL HIGH (ref 5–15)
BUN: 25 mg/dL — ABNORMAL HIGH (ref 8–23)
CO2: 23 mmol/L (ref 22–32)
Calcium: 10.2 mg/dL (ref 8.9–10.3)
Chloride: 100 mmol/L (ref 98–111)
Creatinine, Ser: 1.18 mg/dL — ABNORMAL HIGH (ref 0.44–1.00)
GFR, Estimated: 48 mL/min — ABNORMAL LOW (ref >60)
Glucose, Bld: 114 mg/dL — ABNORMAL HIGH (ref 70–99)
Potassium: 4.2 mmol/L (ref 3.5–5.1)
Sodium: 140 mmol/L (ref 135–145)
Total Bilirubin: 0.5 mg/dL (ref 0.3–1.2)
Total Protein: 7.7 g/dL (ref 6.5–8.1)

## 2022-03-16 LAB — ECHO TEE
MV M vel: 3.92 m/s
MV Peak grad: 61.5 mmHg
Radius: 0.8 cm

## 2022-03-16 LAB — MAGNESIUM: Magnesium: 2.3 mg/dL (ref 1.7–2.4)

## 2022-03-16 LAB — BRAIN NATRIURETIC PEPTIDE: B Natriuretic Peptide: 1021.1 pg/mL — ABNORMAL HIGH (ref 0.0–100.0)

## 2022-03-16 SURGERY — RIGHT/LEFT HEART CATH AND CORONARY ANGIOGRAPHY
Anesthesia: LOCAL

## 2022-03-16 SURGERY — ECHOCARDIOGRAM, TRANSESOPHAGEAL
Anesthesia: Monitor Anesthesia Care

## 2022-03-16 MED ORDER — ORAL CARE MOUTH RINSE: 15.0000 mL | OROMUCOSAL | Status: DC | PRN

## 2022-03-16 MED ORDER — HEPARIN (PORCINE) IN NACL 1000-0.9 UT/500ML-% IV SOLN
INTRAVENOUS | Status: AC
  Filled 2022-03-16: qty 1000

## 2022-03-16 MED ORDER — VERAPAMIL HCL 2.5 MG/ML IV SOLN
INTRAVENOUS | Status: DC | PRN
  Administered 2022-03-16: 10 mL via INTRA_ARTERIAL

## 2022-03-16 MED ORDER — POTASSIUM CHLORIDE CRYS ER 20 MEQ PO TBCR: 40.0000 meq | EXTENDED_RELEASE_TABLET | Freq: Once | ORAL | Status: DC

## 2022-03-16 MED ORDER — SODIUM CHLORIDE 0.9 % IV SOLN: INTRAVENOUS | Status: DC

## 2022-03-16 MED ORDER — SODIUM CHLORIDE 0.9% FLUSH: 3.0000 mL | INTRAVENOUS | Status: DC | PRN

## 2022-03-16 MED ORDER — HEPARIN SODIUM (PORCINE) 1000 UNIT/ML IJ SOLN
INTRAMUSCULAR | Status: DC | PRN
  Administered 2022-03-16: 3500 [IU] via INTRAVENOUS

## 2022-03-16 MED ORDER — PROPOFOL 500 MG/50ML IV EMUL
INTRAVENOUS | Status: DC | PRN
  Administered 2022-03-16: 25 ug/kg/min via INTRAVENOUS

## 2022-03-16 MED ORDER — LIDOCAINE HCL (PF) 1 % IJ SOLN
INTRAMUSCULAR | Status: DC | PRN
  Administered 2022-03-16: 2 mL
  Administered 2022-03-16: 5 mL

## 2022-03-16 MED ORDER — ONDANSETRON HCL 4 MG/2ML IJ SOLN: 4.0000 mg | Freq: Four times a day (QID) | INTRAMUSCULAR | Status: DC | PRN

## 2022-03-16 MED ORDER — PHENYLEPHRINE 80 MCG/ML (10ML) SYRINGE FOR IV PUSH (FOR BLOOD PRESSURE SUPPORT)
PREFILLED_SYRINGE | INTRAVENOUS | Status: DC | PRN
  Administered 2022-03-16: 80 ug via INTRAVENOUS

## 2022-03-16 MED ORDER — HYDRALAZINE HCL 20 MG/ML IJ SOLN: 10.0000 mg | INTRAMUSCULAR | Status: AC | PRN

## 2022-03-16 MED ORDER — LIVING BETTER WITH HEART FAILURE BOOK: Freq: Once | Status: DC

## 2022-03-16 MED ORDER — HEPARIN (PORCINE) IN NACL 1000-0.9 UT/500ML-% IV SOLN
INTRAVENOUS | Status: DC | PRN
  Administered 2022-03-16 (×2): 500 mL

## 2022-03-16 MED ORDER — HEPARIN SODIUM (PORCINE) 1000 UNIT/ML IJ SOLN
INTRAMUSCULAR | Status: AC
  Filled 2022-03-16: qty 10

## 2022-03-16 MED ORDER — SODIUM CHLORIDE 0.9% FLUSH: 3.0000 mL | Freq: Two times a day (BID) | INTRAVENOUS | Status: DC

## 2022-03-16 MED ORDER — SODIUM CHLORIDE 0.9 % IV SOLN: 250.0000 mL | INTRAVENOUS | Status: DC | PRN

## 2022-03-16 MED ORDER — LOSARTAN POTASSIUM 25 MG PO TABS
12.5000 mg | ORAL_TABLET | Freq: Every day | ORAL | Status: DC
  Administered 2022-03-16: 12.5 mg via ORAL
  Filled 2022-03-16: qty 1

## 2022-03-16 MED ORDER — ACETAMINOPHEN 325 MG PO TABS: 650.0000 mg | ORAL_TABLET | ORAL | Status: DC | PRN

## 2022-03-16 MED ORDER — ASPIRIN 81 MG PO CHEW: 81.0000 mg | CHEWABLE_TABLET | Freq: Once | ORAL | Status: DC

## 2022-03-16 MED ORDER — ENOXAPARIN SODIUM 40 MG/0.4ML IJ SOSY
40.0000 mg | PREFILLED_SYRINGE | INTRAMUSCULAR | Status: DC
  Administered 2022-03-16 – 2022-03-18 (×3): 40 mg via SUBCUTANEOUS
  Filled 2022-03-16 (×2): qty 0.4

## 2022-03-16 MED ORDER — IOHEXOL 350 MG/ML SOLN
INTRAVENOUS | Status: DC | PRN
  Administered 2022-03-16: 30 mL

## 2022-03-16 MED ORDER — PHENYLEPHRINE HCL-NACL 20-0.9 MG/250ML-% IV SOLN
INTRAVENOUS | Status: DC | PRN
  Administered 2022-03-16: 25 ug/min via INTRAVENOUS

## 2022-03-16 MED ORDER — LIDOCAINE HCL (CARDIAC) PF 100 MG/5ML IV SOSY
PREFILLED_SYRINGE | INTRAVENOUS | Status: DC | PRN
  Administered 2022-03-16: 30 mg via INTRATRACHEAL

## 2022-03-16 MED ORDER — SODIUM CHLORIDE 0.9% FLUSH
3.0000 mL | Freq: Two times a day (BID) | INTRAVENOUS | Status: DC
  Administered 2022-03-17 – 2022-03-19 (×6): 3 mL via INTRAVENOUS

## 2022-03-16 MED ORDER — KETAMINE HCL 50 MG/5ML IJ SOSY
PREFILLED_SYRINGE | INTRAMUSCULAR | Status: AC
  Filled 2022-03-16: qty 5

## 2022-03-16 MED ORDER — SPIRONOLACTONE 25 MG PO TABS
25.0000 mg | ORAL_TABLET | Freq: Every day | ORAL | Status: DC
  Administered 2022-03-17: 25 mg via ORAL
  Filled 2022-03-16: qty 1

## 2022-03-16 MED ORDER — KETAMINE HCL 10 MG/ML IJ SOLN
INTRAMUSCULAR | Status: DC | PRN
  Administered 2022-03-16 (×2): 10 mg via INTRAVENOUS

## 2022-03-16 MED ORDER — FUROSEMIDE 10 MG/ML IJ SOLN
80.0000 mg | Freq: Once | INTRAMUSCULAR | Status: AC
  Administered 2022-03-16: 80 mg via INTRAVENOUS

## 2022-03-16 MED ORDER — SODIUM CHLORIDE 0.9% FLUSH
3.0000 mL | Freq: Two times a day (BID) | INTRAVENOUS | Status: DC
  Administered 2022-03-17 – 2022-03-19 (×4): 3 mL via INTRAVENOUS

## 2022-03-16 MED ORDER — LIDOCAINE HCL (PF) 1 % IJ SOLN
INTRAMUSCULAR | Status: AC
  Filled 2022-03-16: qty 30

## 2022-03-16 MED ORDER — ASPIRIN 81 MG PO CHEW
CHEWABLE_TABLET | ORAL | Status: AC
  Filled 2022-03-16: qty 1

## 2022-03-16 MED ORDER — VERAPAMIL HCL 2.5 MG/ML IV SOLN
INTRAVENOUS | Status: AC
  Filled 2022-03-16: qty 2

## 2022-03-16 MED ORDER — FUROSEMIDE 10 MG/ML IJ SOLN
INTRAMUSCULAR | Status: AC
  Filled 2022-03-16: qty 8

## 2022-03-16 MED ORDER — EZETIMIBE 10 MG PO TABS
10.0000 mg | ORAL_TABLET | Freq: Every day | ORAL | Status: DC
  Administered 2022-03-16 – 2022-03-18 (×3): 10 mg via ORAL
  Filled 2022-03-16 (×3): qty 1

## 2022-03-16 MED ORDER — FUROSEMIDE 10 MG/ML IJ SOLN
80.0000 mg | Freq: Two times a day (BID) | INTRAMUSCULAR | Status: DC
  Administered 2022-03-16: 10 mg via INTRAVENOUS
  Administered 2022-03-17 – 2022-03-18 (×4): 80 mg via INTRAVENOUS
  Filled 2022-03-16 (×5): qty 8

## 2022-03-16 SURGICAL SUPPLY — 13 items
BAND CMPR LRG ZPHR (HEMOSTASIS) ×1
BAND ZEPHYR COMPRESS 30 LONG (HEMOSTASIS) IMPLANT
CATH 5FR JL3.5 JR4 ANG PIG MP (CATHETERS) IMPLANT
CATH SWAN GANZ 7F STRAIGHT (CATHETERS) IMPLANT
GLIDESHEATH SLEND SS 6F .021 (SHEATH) IMPLANT
GLIDESHEATH SLENDER 7FR .021G (SHEATH) IMPLANT
GUIDEWIRE .025 260CM (WIRE) IMPLANT
GUIDEWIRE INQWIRE 1.5J.035X260 (WIRE) IMPLANT
INQWIRE 1.5J .035X260CM (WIRE) ×1
KIT MICROPUNCTURE NIT STIFF (SHEATH) IMPLANT
PACK CARDIAC CATHETERIZATION (CUSTOM PROCEDURE TRAY) ×1 IMPLANT
SHEATH PROBE COVER 6X72 (BAG) IMPLANT
TRANSDUCER W/STOPCOCK (MISCELLANEOUS) ×1 IMPLANT

## 2022-03-16 NOTE — Anesthesia Postprocedure Evaluation (Signed)
Anesthesia Post Note  Patient: Ashley [name]  Procedure(s) Performed: TRANSESOPHAGEAL ECHOCARDIOGRAM (TEE)     Patient location during evaluation: Cath Lab Anesthesia Type: MAC Level of consciousness: awake and alert Pain management: pain level controlled Vital Signs Assessment: post-procedure vital signs reviewed and stable Respiratory status: spontaneous breathing, nonlabored ventilation and respiratory function stable Cardiovascular status: stable and blood pressure returned to baseline Postop Assessment: no apparent nausea or vomiting Anesthetic complications: no  No notable events documented.  Last Vitals:  Vitals:   03/16/22 1100 03/16/22 1105  BP: 100/62 96/67  Pulse: 86 78  Resp: (!) 25 (!) 27  Temp:    SpO2: 96% 95%    Last Pain:  Vitals:   03/16/22 0951  TempSrc:   PainSc: 0-No pain                 River Ambrosio,W. EDMOND

## 2022-03-16 NOTE — H&P (Addendum)
Advanced Heart Failure Team History and Physical Note   PCP:  [name], MD  PCP-Cardiology: [name], MD     Reason for Admission: Acute on chronic systolic heart failure, severe MR  HPI:   Ashley [name] is a 76 y.o.with a history of morbid obesity, HTN, COPD, GERD , fibromyaligia, 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   Has h/o NICM dating back to 2012 when she had PNA 10/12, EF 25-35%,  9/13 EF 30-35%. EF in 54' had recovered to 60-65% later EF back down to 15-20%.    Admitted 6/20 with increased dyspnea and chest pain. Prior to admit she had been treated with levaquin + prednisone for bronchitis. Bedside ECHO showed severely reduced LVEF-->15% and severe MR. Placed on IV lasix and set up for cath. 6/20 had RHC/LHC with normal coronaries, EF 20-25%, and mildly elevated filling pressures. Underwent upgrade to CRT-D   Echo 10/21: EF 25%   She had COVID 7/21, started on Paxlovid. Subsequently developed diarrhea and was admitted for dehydration and AKI. Also w/ hypoxia. D-dimer was elevated by chest CT negative for PE. Hypoxia felt 2/2 COVID and sent home w/ home O2.    Echo 8/22 EF 30%. RV normal.  Echo 5/23 EF 25-30%.   Admitted 8/23 with CAP and a/c CHF after stopping her diuretics. Diuresed with IV Bumex. Transitioned to oral torsemide.    Was seen in HF clinic 10/26 and was volume overloaded. Furoscix ordered but user error so no benefit from it. Echo 10/27 EF 20-25% Global HK, RV normal. Severe MVR. At f/u appt still SOB/fatigued but volume now stable on device interrogation. L/RHC scheduled to further access hemodynamics/coronaries. At this appt, VAD coordinators met with patient.   RHC today: RA = 12 RV = 55/16 PA = 53/31 (40) PCW = 39 (v = 55) Fick cardiac output/index = 3.4/1.8 PVR = 0.3 WU  Ao sat = 99% PA sat = 53%, 56% 1. Normal coronary arteries with anomalous RCA 2. Severe  LV dysfunction EF 15% due to NICM 3. Markedly elevated filling pressures with prominent v-waves in PCWP tracing c/w known severe MR  TEE today:  LEFT VENTRICLE: EF = 15%. Dilated. Severe global HK RIGHT VENTRICLE: Moderate HK LEFT ATRIUM: Severely dilated LEFT ATRIAL APPENDAGE: No thrombus.  RIGHT ATRIUM: Mildly dilated. + pacer wire AORTIC VALVE:  Trileaflet. Moderately calcified. Trivial AI No AS MITRAL VALVE:    Dilated annulus. Restricted P2. Severe central MR with several jets over P2 and P1. PISA 0.8cm. No stenosis. + systolic flow reversal in PVs.  TRICUSPID VALVE: Normal. Mild TR PULMONIC VALVE: Grossly normal. Trivial PR.  INTERATRIAL SEPTUM: No PFO or ASD. PERICARDIUM: No effusion DESCENDING AORTA: Mild plaque   Will admit post cath for diuresis. Will have structural and VAD teams to see while inpatient to determine next best steps for patient. At home was doing about the same since last seen. Still experiencing SOB with long distances. Still requiring 2L [place] at home. Denied CP.    Home Medications Prior to Admission medications   Medication Sig Start Date End Date Taking? Authorizing Provider  acetaminophen (TYLENOL) 500 MG tablet Take 250 mg by mouth daily as needed for moderate pain or headache (PAIN).   Yes [provider]  ALPRAZolam (XANAX) 0.25 MG tablet Take 1 tablet (0.25 mg total) by mouth 2 (two) times daily as needed for anxiety. Patient taking differently: Take 0.125-0.25 mg  by mouth 2 (two) times daily as needed for anxiety. 03/06/22  Yes [name], MD  aspirin EC 81 MG tablet Take 81 mg by mouth at bedtime.   Yes [provider]  beclomethasone (QVAR) 80 MCG/ACT inhaler Inhale 2 puffs into the lungs 2 (two) times daily. 03/04/21  Yes Parrett, Tammy S, NP  BENZONATATE PO Take 1 capsule by mouth daily as needed for cough.   Yes [provider]  calcium elemental as carbonate (TUMS ULTRA 1000) 400 MG chewable tablet Chew 1,000 mg by  mouth daily as needed for heartburn.   Yes [provider]  carvedilol (COREG) 6.25 MG tablet TAKE 0.5 TABLETS (3.125 MG TOTAL) BY MOUTH 2 (TWO) TIMES DAILY WITH A MEAL. 10/17/21  Yes Gollan, Kathlene November, MD  Cholecalciferol (VITAMIN D3) 50 MCG (2000 UT) capsule Take 1 capsule (2,000 Units total) by mouth daily. 04/15/21  Yes [name], MD  Coenzyme Q10 200 MG capsule Take 200 mg by mouth daily.   Yes [provider]  dextromethorphan (DELSYM) 30 MG/5ML liquid Take 2.5 mLs by mouth daily as needed for cough.   Yes [provider]  docusate sodium (COLACE) 100 MG capsule Take 100 mg by mouth at bedtime as needed for mild constipation.   Yes [provider]  ezetimibe (ZETIA) 10 MG tablet TAKE 1 TABLET BY MOUTH EVERY DAY Patient taking differently: Take 10 mg by mouth at bedtime. 05/03/21  Yes Gollan, Kathlene November, MD  guaiFENesin (MUCINEX) 600 MG 12 hr tablet Take 1 tablet (600 mg total) by mouth 2 (two) times daily as needed for to loosen phlegm. 01/14/22  Yes [name], MD  levalbuterol [facility] HFA) 45 MCG/ACT inhaler Inhale 2 puffs into the lungs every 6 (six) hours as needed for wheezing or shortness of breath. 09/15/21  Yes Furth, Cadence H, PA-C  losartan (COZAAR) 25 MG tablet Take 0.5 tablets (12.5 mg total) by mouth at bedtime. 01/18/22  Yes [place], Maricela Bo, FNP  Menthol, Topical Analgesic, (BIOFREEZE ROLL-ON EX) Apply 1 Application topically daily as needed (For shoulder pain).   Yes [provider]  miconazole (MICOTIN) 2 % powder Apply 1 application  topically daily as needed (yeast infection on stomach).   Yes [provider]  nystatin cream (MYCOSTATIN) Apply 1 application. topically as needed for dry skin (yeast infection). 10/14/21  Yes [name], MD  OXYGEN Inhale 2 L into the lungs continuous.   Yes [provider]  polyethylene glycol (MIRALAX / GLYCOLAX) 17 g packet Take 17 g by mouth daily as needed for mild  constipation or moderate constipation.   Yes [provider]  potassium chloride (KLOR-CON) 10 MEQ tablet Take 1 tablet (10 mEq total) by mouth every other day. 01/14/22  Yes [name], MD  sodium chloride (OCEAN) 0.65 % SOLN nasal spray Place 1 spray into both nostrils 2 (two) times daily.   Yes [provider]  spironolactone (ALDACTONE) 25 MG tablet TAKE 1 TABLET (25 MG TOTAL) BY MOUTH DAILY. 03/08/22  Yes Minna Merritts, MD  torsemide (DEMADEX) 10 MG tablet Take 1 tablet (10 mg total) by mouth every other day. Take extra torsemide 10 mg with potassium as needed for weight >174 01/24/22  Yes Gollan, Kathlene November, MD  VITAMIN E PO Take 1 tablet by mouth daily.   Yes [provider]    Past Medical History: Past Medical History:  Diagnosis Date   Abdominal aortic atherosclerosis ([place]) 11/2015   by CT  Allergy    Anemia    Anxiety    Automatic implantable cardioverter-defibrillator in situ    a. 2012 s/p MDT single lead ICD; b. 10/2018 BiV upgrade-->MDT ser # [id number] H.   Bronchiectasis    a. possibly mild, treat URIs aggressively   Cancer ([place])    basal cell, arms & neck   Chronic sinusitis    COPD (chronic obstructive pulmonary disease) ([place])    COVID-19 12/06/2020   Depression    Dyspnea    Essential hypertension    Pt denies   Family history of adverse reaction to anesthesia    Mother - PONV   Fibrocystic breast disease    Fibromyalgia    GERD (gastroesophageal reflux disease)    H/O multiple allergies 10/10/2013   Hemorrhoids    HFrEF (heart failure with reduced ejection fraction) ([place])    a. 06/2011 cMRI: EF 34%, no LGE; b. 01/2016 Echo: EF 35-40%; c. 07/2016 Echo: EF 40-45%; d. 02/2020 Echo: EF 20-25%, glob HK. GrI DD. Mildly red RV fxn. Mildly dil LA. Triv MR.   History of diverticulitis of colon    History of pneumonia 2012   Insomnia    Irritable bowel syndrome    Laryngeal nodule    Dr. Thomasena Edis   Migraine without aura, without mention  of intractable migraine without mention of status migrainosus    Mitral regurgitation    Monoclonal gammopathy    Neuropathy    feet and hands   Nonischemic cardiomyopathy ([place])    a. 2012 s/p single lead ICD; b. 06/2011 cMRI: EF 34%, no LGE; b. 01/2016 Echo: EF 35-40%; c. 07/2016 Echo: EF 40-45%; d. 10/2018 Cath: nl cors. EF 20-25%; e. 10/2018 s/p BiV MDT ICD upgrade; f. 02/2020 Echo: EF 20-25%, glob HK. GrI DD.   Osteoarthritis    knees, back, hands, low back,    Osteoporosis 01/2013   T-2.7 spine (01/2016)   PONV (postoperative nausea and vomiting)    sigmoid diverticulitis with perforation, abscess and fistula 09/30/2013   Sleep apnea 2010   borderline, inconclusive- /w [place]     Past Surgical History: Past Surgical History:  Procedure Laterality Date   ABDOMINAL HYSTERECTOMY  1987   w/BSO   ANAL RECTAL MANOMETRY N/A 02/02/2016   Procedure: ANO RECTAL MANOMETRY;  Surgeon: Mauri Pole, MD;  Location: [facility];  Service: Endoscopy;  Laterality: N/A;   APPENDECTOMY     AUGMENTATION MAMMAPLASTY     BIV UPGRADE N/A 11/04/2018   Procedure: BIV ICD  UPGRADE;  Surgeon: Evans Lance, MD;  Location: [place] [facility];  Service: Cardiovascular;  Laterality: N/A;   breast cystectomies     due to FCBD   BREAST MASS EXCISION  01/2010   Left-fibrocystic change w/intraductal papilloma, no malignancy   carotid US  23/5573   2-20% LICA, normal RICA   CATARACT EXTRACTION W/PHACO Right 03/08/2021   Procedure: CATARACT EXTRACTION PHACO AND INTRAOCULAR LENS PLACEMENT ([place]) RIGHT;  Surgeon: Birder Robson, MD;  Location: [facility];  Service: Ophthalmology;  Laterality: Right;  13.01 1:15.7   [place] Bunionectomy  11/04/08   Right Great Toe (Dr. Beola Cord)   COLONOSCOPY  01/2018   1 small benign polyp, diverticulosis, healthy anastomosis [name] @ [place])   COLOSTOMY N/A 10/02/2013   DESCENDING COLOSTOMY;  Surgeon: Imogene Burn. Georgette Dover, MD   COLOSTOMY REVERSAL N/A 02/10/2014    [name], MD   EMG/MCV  10/16/01   + mild carpal tunnel   ESOPHAGOGASTRODUODENOSCOPY  09/2014  small HH, irregular Z line, fundic gland polyps with mild gastritis Olevia Perches)   Flex laryngoscopy  06/11/06   ([place]) nml   IMPLANTABLE CARDIOVERTER DEFIBRILLATOR IMPLANT N/A 07/14/2011   MDT single chamber ICD   KNEE ARTHROSCOPY W/ ORIF     Left; "meniscus tear"   LASIK     left eye   MANDIBLE SURGERY     "got about 6 pins in the bottom of my jaw"   NCS  11/2014   L ulnar neuropathy, mild B median nerve entrapment ([place])   OSTEOTOMY     Left foot   PARTIAL COLECTOMY N/A 10/02/2013   PARTIAL COLECTOMY;  Surgeon: Imogene Burn. Tsuei, MD   RIGHT/LEFT HEART CATH AND CORONARY ANGIOGRAPHY N/A 10/16/2018   Procedure: RIGHT/LEFT HEART CATH AND CORONARY ANGIOGRAPHY;  Surgeon: Wellington Hampshire, MD;  Location: [place] [facility];  Service: Cardiovascular;  Laterality: N/A;   sinus surgery     x3 with balloon    TOE AMPUTATION     "toe beside baby toe on left foot; got gangrene from corn"   TONSILLECTOMY AND ADENOIDECTOMY      Family History:  Family History  Problem Relation Age of Onset   Lung cancer Father        + smoker   Colon polyps Father    Dementia Mother    Osteoporosis Mother        Lumbar spine   Stroke Mother        x 5 @ 55 YOA   Arthritis Mother        hands   Emphysema Mother    Alcohol abuse Paternal Grandfather    Stroke Paternal Grandfather        ?   Heart disease Paternal Grandfather    Hypertension Maternal Grandfather        ?   Heart disease Paternal Grandmother    Colon cancer Neg Hx    Esophageal cancer Neg Hx    Gallbladder disease Neg Hx     Social History: Social History   Socioeconomic History   Marital status: Married    Spouse name: Not on file   Number of children: 1   Years of education: Not on file   Highest education level: Not on file  Occupational History   Occupation: Oncologist   Occupation: Chartered loss adjuster  Tobacco Use   Smoking status: Never   Smokeless tobacco: Never   Tobacco comments:    Lived with smokers x 23 yrs, smoked herself "for a week"  Vaping Use   Vaping Use: Never used  Substance and Sexual Activity   Alcohol use: No    Alcohol/week: 0.0 standard drinks of alcohol   Drug use: No   Sexual activity: Not Currently  Other Topics Concern   Not on file  Social History Narrative   Lives with husband Herbie [place]   1 adopted child   Social Determinants of Health   Financial Resource Strain: Medium Risk (05/24/2021)   Overall Financial Resource Strain (CARDIA)    Difficulty of Paying Living Expenses: Somewhat hard  Food Insecurity: No Food Insecurity (12/01/2019)   Hunger Vital Sign    Worried About Running Out of Food in the Last Year: Never true    [place] in the Last Year: Never true  Transportation Needs: No Transportation Needs (02/15/2022)   PRAPARE - Hydrologist (Medical): No    Lack of Transportation (  Non-Medical): No  Physical Activity: Insufficiently Active (12/01/2019)   Exercise Vital Sign    Days of Exercise per Week: 3 days    Minutes of Exercise per Session: 30 min  Stress: Stress Concern Present (12/01/2019)   [place]    Feeling of Stress : Rather much  Social Connections: Not on file    Allergies:  Allergies  Allergen Reactions   Chlorhexidine Other (See Comments)    blisters   Codeine Other (See Comments)    Confusion and dizziness   Cyclobenzaprine Other (See Comments)    Adverse reaction - back and throat pain, dizziness, exhaustion   Cymbalta [Duloxetine Hcl] Other (See Comments)    "body spasms and made me feel weird in my head"    Diflucan [Fluconazole] Nausea And Vomiting   Dilaudid [Hydromorphone Hcl] Other (See Comments)    Feels like she is burning internally    Influenza Vac Split [Influenza Virus Vaccine] Other (See  Comments)    "allergic to concentrated eggs that are in vaccine"   Lasix [Furosemide] Other (See Comments)    Severe hypotension on lasix 40 PO (can handle 20 mg oral dosing) 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    Latex Nausea Only, Rash and Other (See Comments)    Pain and infection   Lipitor [Atorvastatin] Other (See Comments)    Severe muscle cramps to generic atorvastatin.  Able to take brand lipitor.   Morphine And Related Other (See Comments)    Internally feels like she is on fire    Pravastatin Other (See Comments)    myalgias   Prevacid [Lansoprazole] Other (See Comments)    Worsened GI side effects   Prevnar [Pneumococcal 13-Val Conj Vacc] Other (See Comments)    Egg allergy Bad reaction after shot   Rosuvastatin Other (See Comments)    Was not effective controlling lipids   Simvastatin Other (See Comments)    Leg cramps   Tape Other (See Comments) and Rash    All adhesives  - Blisters, itching and burning  blistering   Farxiga [Dapagliflozin] Other (See Comments)    Unknown reaction    Metaxalone Other (See Comments)    Unknown reaction    Tramadol     Leg cramps   Antihistamines, Chlorpheniramine-Type Other (See Comments)    Alters vision   Betadine [Povidone Iodine] Rash    Per pt.    Onion Diarrhea and Other (See Comments)    GI distress, flared IBS   Other Hives and Rash    NOTE: pt is able to take cephalosporins without reaction   Penicillins Other (See Comments)    CHILDHOOD ALLEGY/REACTION: "told 50 years ago I couldn't take it; might be immune to it", able to take amoxicillin Has patient had a PCN reaction causing immediate rash, facial/tongue/throat swelling, SOB or lightheadedness with hypotension: Unknown Has patient had a PCN reaction causing severe rash involving mucus membranes or skin necrosis: UnknowN Has patient had a PCN reaction that required hospitalization: /Unknown Has patient had a PCN reaction occurring within the last 10 years: Unknown If a   Red  Dye Nausea Only and Swelling   Sertraline Other (See Comments)    Overly sedating   Sulfasalazine Other (See Comments)    "makes my whole body smell like sulfa & makes me sick just smelling it"    Objective:    Vital Signs:   Temp:  [96.4 F (35.8 C)] 96.4 F (35.8 C) (11/02 0751)  Pulse Rate:  [80] 80 (11/02 0751) Resp:  [20] 20 (11/02 0751) BP: (99)/(65) 99/65 (11/02 0751) SpO2:  [96 %-98 %] 98 % (11/02 0914) Weight:  [77.1 kg] 77.1 kg (11/02 0751)   Filed Weights   03/16/22 0751  Weight: 77.1 kg     Physical Exam     General:  well appearing.  HEENT: normal Neck: supple. JVD ~12 cm. Carotids 2+ bilat; no bruits. No lymphadenopathy or thyromegaly appreciated. Cor: PMI nondisplaced. Regular rate & rhythm. No rubs, gallops. + Systolic murmur. Lungs: clear, diminished bases Abdomen: soft, nontender, slightly distended. No hepatosplenomegaly. No bruits or masses. Good bowel sounds. Extremities: no cyanosis, clubbing, rash, non-pitting BLE edema  Neuro: alert & oriented x 3, cranial nerves grossly intact. moves all 4 extremities w/o difficulty. Affect pleasant.   Telemetry   V paced 70s (Personally reviewed)    EKG   A-sensed, v-paced 75 bpm 9/23   Labs     Basic Metabolic Panel: Recent Labs  Lab 03/09/22 1601 03/13/22 0951  NA 138 135  K 4.4 3.9  CL 105 100  CO2 21* 24  GLUCOSE 93 160*  BUN 23 23  CREATININE 1.19* 1.24*  CALCIUM 10.0 9.7    Liver Function Tests: Recent Labs  Lab 03/09/22 1601  AST 20  ALT 34  ALKPHOS 118  BILITOT 0.5  PROT 7.7  ALBUMIN 3.7   No results for input(s): "LIPASE", "AMYLASE" in the last 168 hours. No results for input(s): "AMMONIA" in the last 168 hours.  CBC: Recent Labs  Lab 03/09/22 1601 03/13/22 0951  WBC 10.2 9.8  HGB 11.9* 12.0  HCT 37.3 38.1  MCV 92.8 94.5  PLT 201 246    Cardiac Enzymes: No results for input(s): "CKTOTAL", "CKMB", "CKMBINDEX", "TROPONINI" in the last 168 hours.  BNP: BNP (last  3 results) Recent Labs    01/18/22 1044 03/09/22 1601 03/13/22 0951  BNP 553.5* 1,027.8* 710.8*    ProBNP (last 3 results) No results for input(s): "PROBNP" in the last 8760 hours.   CBG: No results for input(s): "GLUCAP" in the last 168 hours.  Coagulation Studies: No results for input(s): "LABPROT", "INR" in the last 72 hours.  Imaging: CARDIAC CATHETERIZATION  Result Date: 03/16/2022 Findings: RA = 12 RV = 55/16 PA = 53/31 (40) PCW = 39 (v = 55) Fick cardiac output/index = 3.4/1.8 PVR = 0.3 WU Ao sat = 99% PA sat = 53%, 56% Assessment: 1. Normal coronary arteries with anomalous RCA 2. Severe LV dysfunction EF 15% due to NICM 3. Markedly elevated filling pressures with prominent v-waves in PCWP tracing c/w known severe MR Plan/Discussion: Admit for diuresis. Structural and VAD teams to see to help plan next steps. Suspect MV Clip may be best next step. Glori Bickers, MD 10:15 AM    Patient Profile  Ashley [name] is a 76 y.o.with a history of morbid obesity, HTN, COPD, GERD , fibromyaligia, 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 Presented for [facility], admitted for diuresis and plan to meet with structural and VAD team for next steps.   Assessment/Plan  1. Chronic Systolic Heart Failure - h/o NICM dating back to 2012 with fluctuating EF. EF improved to 60% in 19' then dropped again   - [facility] 6/20 with normal coronaries and mildly elevated filling pressures.  - Underwent CRT-D upgrade 6/20 but EF has not improved - Echo 6/20 EF 20%, Echo 10/21 EF 25%,  8/22  EF 30%, RV normal, 5/23 EF 25-30%. - Echo 10/23 EF 20-25% Severe MR - TEE today, results pending - L/RHC today: Normal coronaries with anomalous RCA, Severe LV dysfunction EF 15% due to NICM, Markedly elevated filling pressures with prominent v-waves in PCWP tracing c/w known severe MR - NYHA IIIb-IV.  - Volume status elevated on cath, start 29m IV  lasix BID - Continue losartan 12.5 mg q hs (no BP room for Entresto yet, previously on 49/51) - Stop carvedilol 3.125 mg bid with low output - Continue spiro 25 mg daily - Did not tolerate FWilder Glade - VAD team met her while in clinic, will see again while admitted. - Strict I&O, daily weights    2. MR -Severe on ECHO  -TEE today: Mitral valve: Dilated annulus. Restricted P2. Severe central MR with several jets over P2 and P1. PISA 0.8cm. No stenosis. + systolic flow reversal in PVs.  -Structural team to see, possible MV clip   AEarnie LarssonAGACNP-BC  03/16/2022, 10:54 AM  Advanced Heart Failure Team Pager [phone](M-F; 7a - 5p)  Please contact [facility] for night-coverage after hours (4p -7a ) and weekends on [email or web address]  Patient seen and examined with the above-signed Advanced Practice Provider and/or Housestaff. I personally reviewed laboratory data, imaging studies and relevant notes. I independently examined the patient and formulated the important aspects of the plan. I have edited the note to reflect any of my changes or salient points. I have personally discussed the plan with the patient and/or family.  RHC and TEE with EF 15% severe MR and volume overload with low output   SOB. Coughing on exam. NYHA IV  General:  Coughing + dyspneic HEENT: normal Neck: supple. JVP 10. Carotids 2+ bilat; no bruits. No lymphadenopathy or thryomegaly appreciated. Cor: PMI nondisplaced. Regular rate & rhythm. N3/6 MR Lungs: + crackles  Abdomen: obese soft, nontender, nondistended. No hepatosplenomegaly. No bruits or masses. Good bowel sounds. Extremities: no cyanosis, clubbing, rash, edema Neuro: alert & orientedx3, cranial nerves grossly intact. moves all 4 extremities w/o difficulty. Affect pleasant  Admit for diuresis +/- inotropic support. Will d/w VAD and Structural teams to discuss mTEER vs VAD. I suspect VAD is mor appropriate option for her given age and comorbidities.   I d/w  Dr. CBurt Knackwho will see her as an inpatient.   DGlori Bickers MD  12:22 PM

## 2022-03-16 NOTE — Anesthesia Preprocedure Evaluation (Signed)
Anesthesia Evaluation  Patient identified by MRN, date of birth, ID band Patient awake    Reviewed: Allergy & Precautions, H&P , NPO status , Patient's Chart, lab work & pertinent test results  History of Anesthesia Complications (+) PONV and history of anesthetic complications  Airway Mallampati: II  TM Distance: >3 FB Neck ROM: Full    Dental no notable dental hx. (+) Teeth Intact, Dental Advisory Given   Pulmonary shortness of breath, COPD,  COPD inhaler   Pulmonary exam normal breath sounds clear to auscultation       Cardiovascular hypertension, Pt. on medications and Pt. on home beta blockers +CHF  + dysrhythmias + Cardiac Defibrillator  Rhythm:Regular Rate:Normal     Neuro/Psych  Headaches  Anxiety Depression       GI/Hepatic Neg liver ROS,GERD  ,,  Endo/Other  negative endocrine ROS    Renal/GU negative Renal ROS  negative genitourinary   Musculoskeletal   Abdominal   Peds  Hematology  (+) Blood dyscrasia, anemia   Anesthesia Other Findings   Reproductive/Obstetrics negative OB ROS                             Anesthesia Physical Anesthesia Plan  ASA: 4  Anesthesia Plan: MAC   Post-op Pain Management: Minimal or no pain anticipated and Ketamine IV*   Induction: Intravenous  PONV Risk Score and Plan: 3 and Propofol infusion  Airway Management Planned: Natural Airway and Nasal Cannula  Additional Equipment:   Intra-op Plan:   Post-operative Plan:   Informed Consent: I have reviewed the patients History and Physical, chart, labs and discussed the procedure including the risks, benefits and alternatives for the proposed anesthesia with the patient or authorized representative who has indicated his/her understanding and acceptance.     Dental advisory given  Plan Discussed with: CRNA  Anesthesia Plan Comments:        Anesthesia Quick Evaluation

## 2022-03-16 NOTE — Interval H&P Note (Signed)
History and Physical Interval Note:  03/16/2022 7:59 AM  Ashley [name]  has presented today for surgery, with the diagnosis of heart failure.  The various methods of treatment have been discussed with the patient and family. After consideration of risks, benefits and other options for treatment, the patient has consented to  Procedure(s): RIGHT/LEFT HEART CATH AND CORONARY ANGIOGRAPHY (N/A) as a surgical intervention.  The patient's history has been reviewed, patient examined, no change in status, stable for surgery.  I have reviewed the patient's chart and labs.  Questions were answered to the patient's satisfaction.     [name]

## 2022-03-16 NOTE — Interval H&P Note (Signed)
History and Physical Interval Note:  03/16/2022 8:00 AM  Ashley [name]  has presented today for surgery, with the diagnosis of mitral regurgitation.  The various methods of treatment have been discussed with the patient and family. After consideration of risks, benefits and other options for treatment, the patient has consented to  Procedure(s): TRANSESOPHAGEAL ECHOCARDIOGRAM (TEE) (N/A) as a surgical intervention.  The patient's history has been reviewed, patient examined, no change in status, stable for surgery.  I have reviewed the patient's chart and labs.  Questions were answered to the patient's satisfaction.     Angelena Sand

## 2022-03-16 NOTE — CV Procedure (Signed)
    TRANSESOPHAGEAL ECHOCARDIOGRAM   NAME:  Ashley [name]   MRN: [id number] DOB:  Apr 26, 1946   ADMIT DATE: 03/16/2022  INDICATIONS:  Mitral regurgitation  PROCEDURE:   Informed consent was obtained prior to the procedure. The risks, benefits and alternatives for the procedure were discussed and the patient comprehended these risks.  Risks include, but are not limited to, cough, sore throat, vomiting, nausea, somnolence, esophageal and stomach trauma or perforation, bleeding, low blood pressure, aspiration, pneumonia, infection, trauma to the teeth and death.    After a procedural time-out, the patient was sedated by the anesthesia service. Once an appropriate level of sedation was obtained, the transesophageal probe was inserted in the esophagus and stomach without difficulty and multiple views were obtained.    COMPLICATIONS:    There were no immediate complications.  FINDINGS:  LEFT VENTRICLE: EF = 15%. Dilated. Severe global HK  RIGHT VENTRICLE: Moderate HK  LEFT ATRIUM: Severely dilated  LEFT ATRIAL APPENDAGE: No thrombus.   RIGHT ATRIUM: Mildly dilated. + pacer wire  AORTIC VALVE:  Trileaflet. Moderately calcified. Trivial AI No AS  MITRAL VALVE:    Dilated annulus. Restricted P2. Severe central MR with several jets over P2 and P1. PISA 0.8cm. No stenosis. + systolic flow reversal in PVs.   TRICUSPID VALVE: Normal. Mild TR  PULMONIC VALVE: Grossly normal. Trivial PR.   INTERATRIAL SEPTUM: No PFO or ASD.  PERICARDIUM: No effusion  DESCENDING AORTA: Mild plaque     Benay Spice 9:56 AM

## 2022-03-16 NOTE — Telephone Encounter (Signed)
Last filled 01/31/21, #120 mL Last OV:  03/06/22, abd pain Next OV:  none

## 2022-03-16 NOTE — Transfer of Care (Signed)
Immediate Anesthesia Transfer of Care Note  Patient: Ashley [name]  Procedure(s) Performed: TRANSESOPHAGEAL ECHOCARDIOGRAM (TEE)  Patient Location: Cath Lab  Anesthesia Type:MAC  Level of Consciousness: awake, alert , and oriented  Airway & Oxygen Therapy: Patient Spontanous Breathing and Patient connected to nasal cannula oxygen  Post-op Assessment: Report given to RN, Post -op Vital signs reviewed and stable, and Patient moving all extremities X 4  Post vital signs: Reviewed and stable  Last Vitals:  Vitals Value Taken Time  BP    Temp    Pulse    Resp    SpO2      Last Pain:  Vitals:   03/16/22 0751  TempSrc: Tympanic  PainSc: 0-No pain         Complications: No notable events documented.

## 2022-03-16 NOTE — Progress Notes (Signed)
  Echocardiogram Echocardiogram Transesophageal has been performed.  Ashley [name] 03/16/2022, 8:53 AM

## 2022-03-16 NOTE — Progress Notes (Signed)
TR Band Placement- right  Amount of air in band- 0  Time air was removed from band- 1325  Dressing placed- Gauze and Tegaderm  Level prior to band removal- 0  Level after removal-  0  Post Education- Yes   Comments-

## 2022-03-16 NOTE — Anesthesia Procedure Notes (Signed)
Procedure Name: MAC Date/Time: 03/16/2022 8:09 AM  Performed by: Mariea Clonts, CRNAPre-anesthesia Checklist: Patient identified, Emergency Drugs available, Suction available, Patient being monitored and Timeout performed Patient Re-evaluated:Patient Re-evaluated prior to induction Oxygen Delivery Method: Simple face mask and Nasal cannula Preoxygenation: Pre-oxygenation with 100% oxygen Induction Type: IV induction

## 2022-03-17 ENCOUNTER — Other Ambulatory Visit: Payer: Self-pay | Admitting: Family Medicine

## 2022-03-17 ENCOUNTER — Inpatient Hospital Stay: Payer: Self-pay

## 2022-03-17 ENCOUNTER — Encounter (HOSPITAL_COMMUNITY): Payer: Self-pay | Admitting: Internal Medicine

## 2022-03-17 DIAGNOSIS — I5023 Acute on chronic systolic (congestive) heart failure: Secondary | ICD-10-CM

## 2022-03-17 DIAGNOSIS — I255 Ischemic cardiomyopathy: Secondary | ICD-10-CM

## 2022-03-17 LAB — TYPE AND SCREEN
ABO/RH(D): O POS
Antibody Screen: NEGATIVE

## 2022-03-17 LAB — HEPATITIS B SURFACE ANTIGEN: Hepatitis B Surface Ag: NONREACTIVE

## 2022-03-17 LAB — BASIC METABOLIC PANEL
Anion gap: 14 (ref 5–15)
Anion gap: 12 (ref 5–15)
BUN: 28 mg/dL — ABNORMAL HIGH (ref 8–23)
BUN: 29 mg/dL — ABNORMAL HIGH (ref 8–23)
CO2: 23 mmol/L (ref 22–32)
CO2: 25 mmol/L (ref 22–32)
Calcium: 9.5 mg/dL (ref 8.9–10.3)
Calcium: 9.9 mg/dL (ref 8.9–10.3)
Chloride: 98 mmol/L (ref 98–111)
Chloride: 98 mmol/L (ref 98–111)
Creatinine, Ser: 1.31 mg/dL — ABNORMAL HIGH (ref 0.44–1.00)
Creatinine, Ser: 1.37 mg/dL — ABNORMAL HIGH (ref 0.44–1.00)
GFR, Estimated: 42 mL/min — ABNORMAL LOW (ref >60)
GFR, Estimated: 40 mL/min — ABNORMAL LOW (ref >60)
Glucose, Bld: 109 mg/dL — ABNORMAL HIGH (ref 70–99)
Glucose, Bld: 98 mg/dL (ref 70–99)
Potassium: 3.7 mmol/L (ref 3.5–5.1)
Potassium: 4.8 mmol/L (ref 3.5–5.1)
Sodium: 135 mmol/L (ref 135–145)
Sodium: 135 mmol/L (ref 135–145)

## 2022-03-17 LAB — MAGNESIUM
Magnesium: 2 mg/dL (ref 1.7–2.4)
Magnesium: 2.1 mg/dL (ref 1.7–2.4)

## 2022-03-17 LAB — ANTITHROMBIN III: AntiThromb III Func: 123 % — ABNORMAL HIGH (ref 75–120)

## 2022-03-17 LAB — URIC ACID: Uric Acid, Serum: 10.9 mg/dL — ABNORMAL HIGH (ref 2.5–7.1)

## 2022-03-17 LAB — HEMOGLOBIN A1C
Hgb A1c MFr Bld: 5.5 % (ref 4.8–5.6)
Mean Plasma Glucose: 111.15 mg/dL

## 2022-03-17 LAB — HIV ANTIBODY (ROUTINE TESTING W REFLEX): HIV Screen 4th Generation wRfx: NONREACTIVE

## 2022-03-17 LAB — APTT: aPTT: 31 seconds (ref 24–36)

## 2022-03-17 LAB — LIPID PANEL
Cholesterol: 205 mg/dL — ABNORMAL HIGH (ref 0–200)
HDL: 47 mg/dL (ref >40)
LDL Cholesterol: 133 mg/dL — ABNORMAL HIGH (ref 0–99)
Total CHOL/HDL Ratio: 4.4 RATIO
Triglycerides: 127 mg/dL (ref <150)
VLDL: 25 mg/dL (ref 0–40)

## 2022-03-17 LAB — PREALBUMIN: Prealbumin: 25 mg/dL (ref 18–38)

## 2022-03-17 LAB — TSH: TSH: 3.13 u[IU]/mL (ref 0.350–4.500)

## 2022-03-17 LAB — T4, FREE: Free T4: 1 ng/dL (ref 0.61–1.12)

## 2022-03-17 LAB — HEPATITIS B CORE ANTIBODY, TOTAL: Hep B Core Total Ab: NONREACTIVE

## 2022-03-17 LAB — LACTATE DEHYDROGENASE: LDH: 159 U/L (ref 98–192)

## 2022-03-17 MED ORDER — SALINE SPRAY 0.65 % NA SOLN
1.0000 | Freq: Two times a day (BID) | NASAL | Status: DC
  Administered 2022-03-17 – 2022-03-19 (×4): 1 via NASAL
  Filled 2022-03-17: qty 44

## 2022-03-17 MED ORDER — ALPRAZOLAM 0.25 MG PO TABS
0.1250 mg | ORAL_TABLET | Freq: Two times a day (BID) | ORAL | Status: DC | PRN
  Administered 2022-03-17: 0.125 mg via ORAL
  Filled 2022-03-17: qty 1

## 2022-03-17 MED ORDER — MUSCLE RUB 10-15 % EX CREA: 1.0000 | TOPICAL_CREAM | Freq: Every day | CUTANEOUS | Status: DC | PRN

## 2022-03-17 MED ORDER — MENTHOL 3 MG MT LOZG: 1.0000 | LOZENGE | OROMUCOSAL | Status: DC | PRN

## 2022-03-17 MED ORDER — SPIRONOLACTONE 12.5 MG HALF TABLET
12.5000 mg | ORAL_TABLET | Freq: Every day | ORAL | Status: DC
  Administered 2022-03-18: 12.5 mg via ORAL
  Filled 2022-03-17 (×2): qty 1

## 2022-03-17 MED ORDER — ENSURE ENLIVE PO LIQD
237.0000 mL | Freq: Two times a day (BID) | ORAL | Status: DC
  Administered 2022-03-18 – 2022-03-19 (×3): 237 mL via ORAL

## 2022-03-17 MED ORDER — ACETAMINOPHEN 325 MG PO TABS
325.0000 mg | ORAL_TABLET | ORAL | Status: DC | PRN
  Administered 2022-03-18: 162.5 mg via ORAL
  Filled 2022-03-17: qty 1

## 2022-03-17 MED ORDER — BENZONATATE 100 MG PO CAPS
100.0000 mg | ORAL_CAPSULE | Freq: Every day | ORAL | Status: DC | PRN
  Administered 2022-03-17 – 2022-03-18 (×2): 100 mg via ORAL
  Filled 2022-03-17 (×2): qty 1

## 2022-03-17 MED ORDER — POTASSIUM CHLORIDE CRYS ER 20 MEQ PO TBCR
40.0000 meq | EXTENDED_RELEASE_TABLET | Freq: Once | ORAL | Status: AC
  Administered 2022-03-17: 40 meq via ORAL
  Filled 2022-03-17: qty 2

## 2022-03-17 MED ORDER — LEVALBUTEROL HCL 0.63 MG/3ML IN NEBU: 0.6300 mg | INHALATION_SOLUTION | Freq: Four times a day (QID) | RESPIRATORY_TRACT | Status: DC | PRN

## 2022-03-17 MED ORDER — DOCUSATE SODIUM 100 MG PO CAPS
100.0000 mg | ORAL_CAPSULE | Freq: Two times a day (BID) | ORAL | Status: DC | PRN
  Filled 2022-03-17: qty 2

## 2022-03-17 NOTE — Consult Note (Signed)
pericardial effusion. Mitral Valve: Mitral annulus is dilated. There is posterior leaflet tethering with severe (4+) central MR with systolic flow reversal in pulmonary veins. The mitral valve is abnormal. Severe mitral valve regurgitation, with centrally-directed jet. No evidence of mitral valve stenosis. MV peak gradient, 9.1 mmHg. The mean mitral valve gradient is 4.0 mmHg. Tricuspid Valve: The tricuspid valve is normal in structure. Tricuspid valve regurgitation is mild. Aortic Valve: The aortic valve is tricuspid. There is mild calcification of the aortic valve. Aortic valve regurgitation is trivial. Aortic valve sclerosis/calcification  is present, without any evidence of aortic stenosis. Pulmonic Valve: The pulmonic valve was normal in structure. Pulmonic valve regurgitation is trivial. Aorta: The aortic root is normal in size and structure. IAS/Shunts: No atrial level shunt detected by color flow Doppler. Additional Comments: Spectral Doppler performed. MITRAL VALVE MV Peak grad: 9.1 mmHg MV Mean grad: 4.0 mmHg MV Vmax:      1.51 m/s MV Vmean:     88.2 cm/s MR Peak grad:    61.5 mmHg MR Mean grad:    34.0 mmHg MR Vmax:         392.00 cm/s MR Vmean:        265.0 cm/s MR PISA:         4.02 cm MR PISA Eff ROA: 38 mm MR PISA Radius:  0.80 cm Glori Bickers MD Electronically signed by Glori Bickers MD Signature Date/Time: 03/16/2022/12:56:02 PM    Final    CARDIAC CATHETERIZATION  Result Date: 03/16/2022 Findings: RA = 12 RV = 55/16 PA = 53/31 (40) PCW = 39 (v = 55) Fick cardiac output/index = 3.4/1.8 PVR = 0.3 WU Ao sat = 99% PA sat = 53%, 56% Assessment: 1. Normal coronary arteries with anomalous RCA 2. Severe LV dysfunction EF 15% due to NICM 3. Markedly elevated filling pressures with prominent v-waves in PCWP tracing c/w known severe MR Plan/Discussion: Admit for diuresis. Structural and VAD teams to see to help plan next steps. Suspect MV Clip may be best next step. Glori Bickers, MD 10:15 AM  ECHOCARDIOGRAM COMPLETE  Result Date: 03/10/2022    ECHOCARDIOGRAM REPORT   Patient Name:   Ashley [name] Date of Exam: 03/10/2022 Medical Rec #:  [id number]           Height:       65.0 in Accession #:    [id number]          Weight:       173.6 lb Date of Birth:  [birth year]            BSA:          1.863 m Patient Age:    76 years            BP:           105/70 mmHg Patient Gender: F                   HR:           98 bpm. Exam Location:  Outpatient Procedure: 2D Echo, Color Doppler and Cardiac Doppler Indications:    congestive heart failure  History:        Patient has prior history of Echocardiogram examinations, most                  recent 09/15/2021. CHF and Cardiomyopathy, Defibrillator and                 Pacemaker, Arrythmias:Atrial Fibrillation and  pericardial effusion. Mitral Valve: Mitral annulus is dilated. There is posterior leaflet tethering with severe (4+) central MR with systolic flow reversal in pulmonary veins. The mitral valve is abnormal. Severe mitral valve regurgitation, with centrally-directed jet. No evidence of mitral valve stenosis. MV peak gradient, 9.1 mmHg. The mean mitral valve gradient is 4.0 mmHg. Tricuspid Valve: The tricuspid valve is normal in structure. Tricuspid valve regurgitation is mild. Aortic Valve: The aortic valve is tricuspid. There is mild calcification of the aortic valve. Aortic valve regurgitation is trivial. Aortic valve sclerosis/calcification  is present, without any evidence of aortic stenosis. Pulmonic Valve: The pulmonic valve was normal in structure. Pulmonic valve regurgitation is trivial. Aorta: The aortic root is normal in size and structure. IAS/Shunts: No atrial level shunt detected by color flow Doppler. Additional Comments: Spectral Doppler performed. MITRAL VALVE MV Peak grad: 9.1 mmHg MV Mean grad: 4.0 mmHg MV Vmax:      1.51 m/s MV Vmean:     88.2 cm/s MR Peak grad:    61.5 mmHg MR Mean grad:    34.0 mmHg MR Vmax:         392.00 cm/s MR Vmean:        265.0 cm/s MR PISA:         4.02 cm MR PISA Eff ROA: 38 mm MR PISA Radius:  0.80 cm Glori Bickers MD Electronically signed by Glori Bickers MD Signature Date/Time: 03/16/2022/12:56:02 PM    Final    CARDIAC CATHETERIZATION  Result Date: 03/16/2022 Findings: RA = 12 RV = 55/16 PA = 53/31 (40) PCW = 39 (v = 55) Fick cardiac output/index = 3.4/1.8 PVR = 0.3 WU Ao sat = 99% PA sat = 53%, 56% Assessment: 1. Normal coronary arteries with anomalous RCA 2. Severe LV dysfunction EF 15% due to NICM 3. Markedly elevated filling pressures with prominent v-waves in PCWP tracing c/w known severe MR Plan/Discussion: Admit for diuresis. Structural and VAD teams to see to help plan next steps. Suspect MV Clip may be best next step. Glori Bickers, MD 10:15 AM  ECHOCARDIOGRAM COMPLETE  Result Date: 03/10/2022    ECHOCARDIOGRAM REPORT   Patient Name:   Ashley [name] Date of Exam: 03/10/2022 Medical Rec #:  [id number]           Height:       65.0 in Accession #:    [id number]          Weight:       173.6 lb Date of Birth:  [birth year]            BSA:          1.863 m Patient Age:    76 years            BP:           105/70 mmHg Patient Gender: F                   HR:           98 bpm. Exam Location:  Outpatient Procedure: 2D Echo, Color Doppler and Cardiac Doppler Indications:    congestive heart failure  History:        Patient has prior history of Echocardiogram examinations, most                  recent 09/15/2021. CHF and Cardiomyopathy, Defibrillator and                 Pacemaker, Arrythmias:Atrial Fibrillation and  pericardial effusion. Mitral Valve: Mitral annulus is dilated. There is posterior leaflet tethering with severe (4+) central MR with systolic flow reversal in pulmonary veins. The mitral valve is abnormal. Severe mitral valve regurgitation, with centrally-directed jet. No evidence of mitral valve stenosis. MV peak gradient, 9.1 mmHg. The mean mitral valve gradient is 4.0 mmHg. Tricuspid Valve: The tricuspid valve is normal in structure. Tricuspid valve regurgitation is mild. Aortic Valve: The aortic valve is tricuspid. There is mild calcification of the aortic valve. Aortic valve regurgitation is trivial. Aortic valve sclerosis/calcification  is present, without any evidence of aortic stenosis. Pulmonic Valve: The pulmonic valve was normal in structure. Pulmonic valve regurgitation is trivial. Aorta: The aortic root is normal in size and structure. IAS/Shunts: No atrial level shunt detected by color flow Doppler. Additional Comments: Spectral Doppler performed. MITRAL VALVE MV Peak grad: 9.1 mmHg MV Mean grad: 4.0 mmHg MV Vmax:      1.51 m/s MV Vmean:     88.2 cm/s MR Peak grad:    61.5 mmHg MR Mean grad:    34.0 mmHg MR Vmax:         392.00 cm/s MR Vmean:        265.0 cm/s MR PISA:         4.02 cm MR PISA Eff ROA: 38 mm MR PISA Radius:  0.80 cm Glori Bickers MD Electronically signed by Glori Bickers MD Signature Date/Time: 03/16/2022/12:56:02 PM    Final    CARDIAC CATHETERIZATION  Result Date: 03/16/2022 Findings: RA = 12 RV = 55/16 PA = 53/31 (40) PCW = 39 (v = 55) Fick cardiac output/index = 3.4/1.8 PVR = 0.3 WU Ao sat = 99% PA sat = 53%, 56% Assessment: 1. Normal coronary arteries with anomalous RCA 2. Severe LV dysfunction EF 15% due to NICM 3. Markedly elevated filling pressures with prominent v-waves in PCWP tracing c/w known severe MR Plan/Discussion: Admit for diuresis. Structural and VAD teams to see to help plan next steps. Suspect MV Clip may be best next step. Glori Bickers, MD 10:15 AM  ECHOCARDIOGRAM COMPLETE  Result Date: 03/10/2022    ECHOCARDIOGRAM REPORT   Patient Name:   Ashley [name] Date of Exam: 03/10/2022 Medical Rec #:  [id number]           Height:       65.0 in Accession #:    [id number]          Weight:       173.6 lb Date of Birth:  [birth year]            BSA:          1.863 m Patient Age:    76 years            BP:           105/70 mmHg Patient Gender: F                   HR:           98 bpm. Exam Location:  Outpatient Procedure: 2D Echo, Color Doppler and Cardiac Doppler Indications:    congestive heart failure  History:        Patient has prior history of Echocardiogram examinations, most                  recent 09/15/2021. CHF and Cardiomyopathy, Defibrillator and                 Pacemaker, Arrythmias:Atrial Fibrillation and  [address]       [place] [address]             [phone]                    Vylet E Tenenbaum [facility] Medical Record #[id number] Date of Birth: 05/31/1945  Referring: No ref. provider found Primary Care: [name], MD Primary Cardiologist: [name], MD  Chief Complaint:   No chief complaint on file.   History of Present Illness:    JONIYA BOBERG 76 y.o. female presenting for LVAD evaluation. Her comorbidities include history of morbid obesity, HTN, COPD, GERD , fibromyaligia, 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    She had had NICM for over 10 years and at one point recovered her function. Currently she is back in the 10-15% EF range with severe MR.   Of note, she reports to me she has been on home 02 for 6-8 years. She has worked on getting longer tanks - as she visits family in [place] area and they have to take 3 tanks of 02 that each last a couple hours when they go.   She recently in the last few months wears 02 at night as well. Used to be only prn at night.   An episode of issues with wound healing was also reported.  Apparently she had abdominal surgery and then 3 months of her husband doing dressing changes at home with an open incision. Subsequent to this she reports they was some soft of abscess recurrence and/or dehiscence and another 3 month period of wound care daily dressing changes at home. Total of 6 months.    Past Medical History:  Diagnosis Date   Abdominal aortic atherosclerosis ([place]) 11/2015   by CT   Allergy    Anemia    Anxiety    Automatic implantable cardioverter-defibrillator in situ    a. 2012 s/p MDT single lead ICD; b. 10/2018 BiV upgrade-->MDT ser # [id number] H.   Bronchiectasis    a. possibly mild, treat URIs aggressively   Cancer ([place])    basal cell, arms & neck   Chronic sinusitis    COPD (chronic  obstructive pulmonary disease) ([place])    COVID-19 12/06/2020   Depression    Dyspnea    Essential hypertension    Pt denies   Family history of adverse reaction to anesthesia    Mother - PONV   Fibrocystic breast disease    Fibromyalgia    GERD (gastroesophageal reflux disease)    H/O multiple allergies 10/10/2013   Hemorrhoids    HFrEF (heart failure with reduced ejection fraction) ([place])    a. 06/2011 cMRI: EF 34%, no LGE; b. 01/2016 Echo: EF 35-40%; c. 07/2016 Echo: EF 40-45%; d. 02/2020 Echo: EF 20-25%, glob HK. GrI DD. Mildly red RV fxn. Mildly dil LA. Triv MR.   History of diverticulitis of colon    History of pneumonia 2012   Insomnia    Irritable bowel syndrome    Laryngeal nodule    Dr. Thomasena Edis   Migraine without aura, without mention of intractable migraine without mention of status migrainosus    Mitral regurgitation    Monoclonal gammopathy    Neuropathy    feet and hands   Nonischemic cardiomyopathy ([place])    a. 2012 s/p single lead ICD; b. 06/2011 cMRI: EF 34%, no LGE;  pericardial effusion. Mitral Valve: Mitral annulus is dilated. There is posterior leaflet tethering with severe (4+) central MR with systolic flow reversal in pulmonary veins. The mitral valve is abnormal. Severe mitral valve regurgitation, with centrally-directed jet. No evidence of mitral valve stenosis. MV peak gradient, 9.1 mmHg. The mean mitral valve gradient is 4.0 mmHg. Tricuspid Valve: The tricuspid valve is normal in structure. Tricuspid valve regurgitation is mild. Aortic Valve: The aortic valve is tricuspid. There is mild calcification of the aortic valve. Aortic valve regurgitation is trivial. Aortic valve sclerosis/calcification  is present, without any evidence of aortic stenosis. Pulmonic Valve: The pulmonic valve was normal in structure. Pulmonic valve regurgitation is trivial. Aorta: The aortic root is normal in size and structure. IAS/Shunts: No atrial level shunt detected by color flow Doppler. Additional Comments: Spectral Doppler performed. MITRAL VALVE MV Peak grad: 9.1 mmHg MV Mean grad: 4.0 mmHg MV Vmax:      1.51 m/s MV Vmean:     88.2 cm/s MR Peak grad:    61.5 mmHg MR Mean grad:    34.0 mmHg MR Vmax:         392.00 cm/s MR Vmean:        265.0 cm/s MR PISA:         4.02 cm MR PISA Eff ROA: 38 mm MR PISA Radius:  0.80 cm Glori Bickers MD Electronically signed by Glori Bickers MD Signature Date/Time: 03/16/2022/12:56:02 PM    Final    CARDIAC CATHETERIZATION  Result Date: 03/16/2022 Findings: RA = 12 RV = 55/16 PA = 53/31 (40) PCW = 39 (v = 55) Fick cardiac output/index = 3.4/1.8 PVR = 0.3 WU Ao sat = 99% PA sat = 53%, 56% Assessment: 1. Normal coronary arteries with anomalous RCA 2. Severe LV dysfunction EF 15% due to NICM 3. Markedly elevated filling pressures with prominent v-waves in PCWP tracing c/w known severe MR Plan/Discussion: Admit for diuresis. Structural and VAD teams to see to help plan next steps. Suspect MV Clip may be best next step. Glori Bickers, MD 10:15 AM  ECHOCARDIOGRAM COMPLETE  Result Date: 03/10/2022    ECHOCARDIOGRAM REPORT   Patient Name:   Ashley [name] Date of Exam: 03/10/2022 Medical Rec #:  [id number]           Height:       65.0 in Accession #:    [id number]          Weight:       173.6 lb Date of Birth:  [birth year]            BSA:          1.863 m Patient Age:    76 years            BP:           105/70 mmHg Patient Gender: F                   HR:           98 bpm. Exam Location:  Outpatient Procedure: 2D Echo, Color Doppler and Cardiac Doppler Indications:    congestive heart failure  History:        Patient has prior history of Echocardiogram examinations, most                  recent 09/15/2021. CHF and Cardiomyopathy, Defibrillator and                 Pacemaker, Arrythmias:Atrial Fibrillation and  pericardial effusion. Mitral Valve: Mitral annulus is dilated. There is posterior leaflet tethering with severe (4+) central MR with systolic flow reversal in pulmonary veins. The mitral valve is abnormal. Severe mitral valve regurgitation, with centrally-directed jet. No evidence of mitral valve stenosis. MV peak gradient, 9.1 mmHg. The mean mitral valve gradient is 4.0 mmHg. Tricuspid Valve: The tricuspid valve is normal in structure. Tricuspid valve regurgitation is mild. Aortic Valve: The aortic valve is tricuspid. There is mild calcification of the aortic valve. Aortic valve regurgitation is trivial. Aortic valve sclerosis/calcification  is present, without any evidence of aortic stenosis. Pulmonic Valve: The pulmonic valve was normal in structure. Pulmonic valve regurgitation is trivial. Aorta: The aortic root is normal in size and structure. IAS/Shunts: No atrial level shunt detected by color flow Doppler. Additional Comments: Spectral Doppler performed. MITRAL VALVE MV Peak grad: 9.1 mmHg MV Mean grad: 4.0 mmHg MV Vmax:      1.51 m/s MV Vmean:     88.2 cm/s MR Peak grad:    61.5 mmHg MR Mean grad:    34.0 mmHg MR Vmax:         392.00 cm/s MR Vmean:        265.0 cm/s MR PISA:         4.02 cm MR PISA Eff ROA: 38 mm MR PISA Radius:  0.80 cm Glori Bickers MD Electronically signed by Glori Bickers MD Signature Date/Time: 03/16/2022/12:56:02 PM    Final    CARDIAC CATHETERIZATION  Result Date: 03/16/2022 Findings: RA = 12 RV = 55/16 PA = 53/31 (40) PCW = 39 (v = 55) Fick cardiac output/index = 3.4/1.8 PVR = 0.3 WU Ao sat = 99% PA sat = 53%, 56% Assessment: 1. Normal coronary arteries with anomalous RCA 2. Severe LV dysfunction EF 15% due to NICM 3. Markedly elevated filling pressures with prominent v-waves in PCWP tracing c/w known severe MR Plan/Discussion: Admit for diuresis. Structural and VAD teams to see to help plan next steps. Suspect MV Clip may be best next step. Glori Bickers, MD 10:15 AM  ECHOCARDIOGRAM COMPLETE  Result Date: 03/10/2022    ECHOCARDIOGRAM REPORT   Patient Name:   Ashley [name] Date of Exam: 03/10/2022 Medical Rec #:  [id number]           Height:       65.0 in Accession #:    [id number]          Weight:       173.6 lb Date of Birth:  [birth year]            BSA:          1.863 m Patient Age:    76 years            BP:           105/70 mmHg Patient Gender: F                   HR:           98 bpm. Exam Location:  Outpatient Procedure: 2D Echo, Color Doppler and Cardiac Doppler Indications:    congestive heart failure  History:        Patient has prior history of Echocardiogram examinations, most                  recent 09/15/2021. CHF and Cardiomyopathy, Defibrillator and                 Pacemaker, Arrythmias:Atrial Fibrillation and  pericardial effusion. Mitral Valve: Mitral annulus is dilated. There is posterior leaflet tethering with severe (4+) central MR with systolic flow reversal in pulmonary veins. The mitral valve is abnormal. Severe mitral valve regurgitation, with centrally-directed jet. No evidence of mitral valve stenosis. MV peak gradient, 9.1 mmHg. The mean mitral valve gradient is 4.0 mmHg. Tricuspid Valve: The tricuspid valve is normal in structure. Tricuspid valve regurgitation is mild. Aortic Valve: The aortic valve is tricuspid. There is mild calcification of the aortic valve. Aortic valve regurgitation is trivial. Aortic valve sclerosis/calcification  is present, without any evidence of aortic stenosis. Pulmonic Valve: The pulmonic valve was normal in structure. Pulmonic valve regurgitation is trivial. Aorta: The aortic root is normal in size and structure. IAS/Shunts: No atrial level shunt detected by color flow Doppler. Additional Comments: Spectral Doppler performed. MITRAL VALVE MV Peak grad: 9.1 mmHg MV Mean grad: 4.0 mmHg MV Vmax:      1.51 m/s MV Vmean:     88.2 cm/s MR Peak grad:    61.5 mmHg MR Mean grad:    34.0 mmHg MR Vmax:         392.00 cm/s MR Vmean:        265.0 cm/s MR PISA:         4.02 cm MR PISA Eff ROA: 38 mm MR PISA Radius:  0.80 cm Glori Bickers MD Electronically signed by Glori Bickers MD Signature Date/Time: 03/16/2022/12:56:02 PM    Final    CARDIAC CATHETERIZATION  Result Date: 03/16/2022 Findings: RA = 12 RV = 55/16 PA = 53/31 (40) PCW = 39 (v = 55) Fick cardiac output/index = 3.4/1.8 PVR = 0.3 WU Ao sat = 99% PA sat = 53%, 56% Assessment: 1. Normal coronary arteries with anomalous RCA 2. Severe LV dysfunction EF 15% due to NICM 3. Markedly elevated filling pressures with prominent v-waves in PCWP tracing c/w known severe MR Plan/Discussion: Admit for diuresis. Structural and VAD teams to see to help plan next steps. Suspect MV Clip may be best next step. Glori Bickers, MD 10:15 AM  ECHOCARDIOGRAM COMPLETE  Result Date: 03/10/2022    ECHOCARDIOGRAM REPORT   Patient Name:   Ashley [name] Date of Exam: 03/10/2022 Medical Rec #:  [id number]           Height:       65.0 in Accession #:    [id number]          Weight:       173.6 lb Date of Birth:  [birth year]            BSA:          1.863 m Patient Age:    76 years            BP:           105/70 mmHg Patient Gender: F                   HR:           98 bpm. Exam Location:  Outpatient Procedure: 2D Echo, Color Doppler and Cardiac Doppler Indications:    congestive heart failure  History:        Patient has prior history of Echocardiogram examinations, most                  recent 09/15/2021. CHF and Cardiomyopathy, Defibrillator and                 Pacemaker, Arrythmias:Atrial Fibrillation and  pericardial effusion. Mitral Valve: Mitral annulus is dilated. There is posterior leaflet tethering with severe (4+) central MR with systolic flow reversal in pulmonary veins. The mitral valve is abnormal. Severe mitral valve regurgitation, with centrally-directed jet. No evidence of mitral valve stenosis. MV peak gradient, 9.1 mmHg. The mean mitral valve gradient is 4.0 mmHg. Tricuspid Valve: The tricuspid valve is normal in structure. Tricuspid valve regurgitation is mild. Aortic Valve: The aortic valve is tricuspid. There is mild calcification of the aortic valve. Aortic valve regurgitation is trivial. Aortic valve sclerosis/calcification  is present, without any evidence of aortic stenosis. Pulmonic Valve: The pulmonic valve was normal in structure. Pulmonic valve regurgitation is trivial. Aorta: The aortic root is normal in size and structure. IAS/Shunts: No atrial level shunt detected by color flow Doppler. Additional Comments: Spectral Doppler performed. MITRAL VALVE MV Peak grad: 9.1 mmHg MV Mean grad: 4.0 mmHg MV Vmax:      1.51 m/s MV Vmean:     88.2 cm/s MR Peak grad:    61.5 mmHg MR Mean grad:    34.0 mmHg MR Vmax:         392.00 cm/s MR Vmean:        265.0 cm/s MR PISA:         4.02 cm MR PISA Eff ROA: 38 mm MR PISA Radius:  0.80 cm Glori Bickers MD Electronically signed by Glori Bickers MD Signature Date/Time: 03/16/2022/12:56:02 PM    Final    CARDIAC CATHETERIZATION  Result Date: 03/16/2022 Findings: RA = 12 RV = 55/16 PA = 53/31 (40) PCW = 39 (v = 55) Fick cardiac output/index = 3.4/1.8 PVR = 0.3 WU Ao sat = 99% PA sat = 53%, 56% Assessment: 1. Normal coronary arteries with anomalous RCA 2. Severe LV dysfunction EF 15% due to NICM 3. Markedly elevated filling pressures with prominent v-waves in PCWP tracing c/w known severe MR Plan/Discussion: Admit for diuresis. Structural and VAD teams to see to help plan next steps. Suspect MV Clip may be best next step. Glori Bickers, MD 10:15 AM  ECHOCARDIOGRAM COMPLETE  Result Date: 03/10/2022    ECHOCARDIOGRAM REPORT   Patient Name:   Ashley [name] Date of Exam: 03/10/2022 Medical Rec #:  [id number]           Height:       65.0 in Accession #:    [id number]          Weight:       173.6 lb Date of Birth:  [birth year]            BSA:          1.863 m Patient Age:    76 years            BP:           105/70 mmHg Patient Gender: F                   HR:           98 bpm. Exam Location:  Outpatient Procedure: 2D Echo, Color Doppler and Cardiac Doppler Indications:    congestive heart failure  History:        Patient has prior history of Echocardiogram examinations, most                  recent 09/15/2021. CHF and Cardiomyopathy, Defibrillator and                 Pacemaker, Arrythmias:Atrial Fibrillation and  pericardial effusion. Mitral Valve: Mitral annulus is dilated. There is posterior leaflet tethering with severe (4+) central MR with systolic flow reversal in pulmonary veins. The mitral valve is abnormal. Severe mitral valve regurgitation, with centrally-directed jet. No evidence of mitral valve stenosis. MV peak gradient, 9.1 mmHg. The mean mitral valve gradient is 4.0 mmHg. Tricuspid Valve: The tricuspid valve is normal in structure. Tricuspid valve regurgitation is mild. Aortic Valve: The aortic valve is tricuspid. There is mild calcification of the aortic valve. Aortic valve regurgitation is trivial. Aortic valve sclerosis/calcification  is present, without any evidence of aortic stenosis. Pulmonic Valve: The pulmonic valve was normal in structure. Pulmonic valve regurgitation is trivial. Aorta: The aortic root is normal in size and structure. IAS/Shunts: No atrial level shunt detected by color flow Doppler. Additional Comments: Spectral Doppler performed. MITRAL VALVE MV Peak grad: 9.1 mmHg MV Mean grad: 4.0 mmHg MV Vmax:      1.51 m/s MV Vmean:     88.2 cm/s MR Peak grad:    61.5 mmHg MR Mean grad:    34.0 mmHg MR Vmax:         392.00 cm/s MR Vmean:        265.0 cm/s MR PISA:         4.02 cm MR PISA Eff ROA: 38 mm MR PISA Radius:  0.80 cm Glori Bickers MD Electronically signed by Glori Bickers MD Signature Date/Time: 03/16/2022/12:56:02 PM    Final    CARDIAC CATHETERIZATION  Result Date: 03/16/2022 Findings: RA = 12 RV = 55/16 PA = 53/31 (40) PCW = 39 (v = 55) Fick cardiac output/index = 3.4/1.8 PVR = 0.3 WU Ao sat = 99% PA sat = 53%, 56% Assessment: 1. Normal coronary arteries with anomalous RCA 2. Severe LV dysfunction EF 15% due to NICM 3. Markedly elevated filling pressures with prominent v-waves in PCWP tracing c/w known severe MR Plan/Discussion: Admit for diuresis. Structural and VAD teams to see to help plan next steps. Suspect MV Clip may be best next step. Glori Bickers, MD 10:15 AM  ECHOCARDIOGRAM COMPLETE  Result Date: 03/10/2022    ECHOCARDIOGRAM REPORT   Patient Name:   Ashley [name] Date of Exam: 03/10/2022 Medical Rec #:  [id number]           Height:       65.0 in Accession #:    [id number]          Weight:       173.6 lb Date of Birth:  [birth year]            BSA:          1.863 m Patient Age:    76 years            BP:           105/70 mmHg Patient Gender: F                   HR:           98 bpm. Exam Location:  Outpatient Procedure: 2D Echo, Color Doppler and Cardiac Doppler Indications:    congestive heart failure  History:        Patient has prior history of Echocardiogram examinations, most                  recent 09/15/2021. CHF and Cardiomyopathy, Defibrillator and                 Pacemaker, Arrythmias:Atrial Fibrillation and

## 2022-03-17 NOTE — Plan of Care (Signed)
  Problem: Education: Goal: Understanding of CV disease, CV risk reduction, and recovery process will improve Outcome: Progressing Goal: Individualized Educational Video(s) Outcome: Progressing   Problem: Activity: Goal: Ability to return to baseline activity level will improve Outcome: Progressing   Problem: Cardiovascular: Goal: Ability to achieve and maintain adequate cardiovascular perfusion will improve Outcome: Progressing Goal: Vascular access site(s) Level 0-1 will be maintained Outcome: Progressing   Problem: Health Behavior/Discharge Planning: Goal: Ability to safely manage health-related needs after discharge will improve Outcome: Progressing   Problem: Education: Goal: Knowledge of General Education information will improve Description: Including pain rating scale, medication(s)/side effects and non-pharmacologic comfort measures Outcome: Progressing   Problem: Health Behavior/Discharge Planning: Goal: Ability to manage health-related needs will improve Outcome: Progressing   Problem: Clinical Measurements: Goal: Ability to maintain clinical measurements within normal limits will improve Outcome: Progressing Goal: Will remain free from infection Outcome: Progressing Goal: Diagnostic test results will improve Outcome: Progressing Goal: Respiratory complications will improve Outcome: Progressing Goal: Cardiovascular complication will be avoided Outcome: Progressing   Problem: Activity: Goal: Risk for activity intolerance will decrease Outcome: Progressing   Problem: Nutrition: Goal: Adequate nutrition will be maintained Outcome: Progressing   Problem: Coping: Goal: Level of anxiety will decrease Outcome: Progressing   Problem: Elimination: Goal: Will not experience complications related to bowel motility Outcome: Progressing Goal: Will not experience complications related to urinary retention Outcome: Progressing   Problem: Pain Managment: Goal:  General experience of comfort will improve Outcome: Progressing   Problem: Safety: Goal: Ability to remain free from injury will improve Outcome: Progressing   Problem: Skin Integrity: Goal: Risk for impaired skin integrity will decrease Outcome: Progressing   Problem: Education: Goal: Ability to demonstrate management of disease process will improve Outcome: Progressing Goal: Ability to verbalize understanding of medication therapies will improve Outcome: Progressing Goal: Individualized Educational Video(s) Outcome: Progressing   Problem: Activity: Goal: Capacity to carry out activities will improve Outcome: Progressing   Problem: Cardiac: Goal: Ability to achieve and maintain adequate cardiopulmonary perfusion will improve Outcome: Progressing

## 2022-03-17 NOTE — Progress Notes (Addendum)
Advanced Heart Failure Rounding Note  PCP-Cardiologist: [name], MD   Subjective:   11/2: RHC and TEE with EF 15% severe MR and volume overload with low output. Admitted for VAD vs mTEER evals. RA = 12 RV = 55/16 PA = 53/31 (40) PCW = 39 (v = 55) Fick cardiac output/index = 3.4/1.8 PVR = 0.3 WU  Ao sat = 99% PA sat = 53%, 56% 1. Normal coronary arteries with anomalous RCA 2. Severe LV dysfunction EF 15% due to NICM 3. Markedly elevated filling pressures with prominent v-waves in PCWP tracing c/w known severe MR     Had a bad night, got headache from nurses fabric softener. Concerned about her 28 allergies and being allergic to metals/rubbers (concerned about VAD material). Denies CP. Seems overall very anxious about everything. Feels like she "choking" can't breathe. Kept eating ice chips to keep airway open? per patient.   Only tolerated 50m of lasix last evening, held 2/2 soft BP. Only 2081mUOP?, weight down 3lbs.  Stable on exam, breathing fine. On 2L [place]. BP soft but stable. Overall very anxious and concerned about possible LVAD. Spoke with CTS yesterday and has been extra anxious since.   Objective:   Weight Range: 76.1 kg Body mass index is 27.92 kg/m.   Vital Signs:   Temp:  [96.4 F (35.8 C)-98 F (36.7 C)] 97.6 F (36.4 C) (11/03 0343) Pulse Rate:  [76-91] 76 (11/03 0343) Resp:  [16-27] 16 (11/03 0343) BP: (92-106)/(53-68) 96/60 (11/03 0343) SpO2:  [92 %-98 %] 94 % (11/03 0343) Weight:  [76.1 kg-77.1 kg] 76.1 kg (11/03 0600) Last BM Date :  (PTA)  Weight change: Filed Weights   03/16/22 0751 03/17/22 0600  Weight: 77.1 kg 76.1 kg    Intake/Output:   Intake/Output Summary (Last 24 hours) at 03/17/2022 0728 Last data filed at 03/17/2022 0500 Gross per 24 hour  Intake 1000 ml  Output 500 ml  Net 500 ml     Physical Exam    General:  well appearing.  No respiratory difficulty HEENT: normal Neck: supple. JVD ~10 cm. Carotids 2+ bilat; no  bruits. No lymphadenopathy or thyromegaly appreciated. Cor: PMI nondisplaced. Regular rate & rhythm. No rubs, gallops or murmurs. Lungs: clear Abdomen: soft, nontender, nondistended. No hepatosplenomegaly. No bruits or masses. Good bowel sounds. Extremities: no cyanosis, clubbing, rash, edema  Neuro: alert & oriented x 3, cranial nerves grossly intact. moves all 4 extremities w/o difficulty. Affect pleasant.   Telemetry   BiV paced 80s, 22 beats NSVT (Personally reviewed)    EKG    No new EKG to review  Labs    CBC Recent Labs    03/16/22 0949 03/16/22 1703  WBC  --  13.4*  HGB 11.2* 12.7  HCT 33.0* 37.8  MCV  --  91.1  PLT  --  27449 Basic Metabolic Panel Recent Labs    03/16/22 1538 03/16/22 1703 03/17/22 0041  NA 140  --  135  K 4.2  --  3.7  CL 100  --  98  CO2 23  --  23  GLUCOSE 114*  --  109*  BUN 25*  --  28*  CREATININE 1.18*  --  1.31*  CALCIUM 10.2  --  9.5  MG  --  2.3 2.0   Liver Function Tests Recent Labs    03/16/22 1538  AST 23  ALT 27  ALKPHOS 92  BILITOT 0.5  PROT 7.7  ALBUMIN 3.9  No results for input(s): "LIPASE", "AMYLASE" in the last 72 hours. Cardiac Enzymes No results for input(s): "CKTOTAL", "CKMB", "CKMBINDEX", "TROPONINI" in the last 72 hours.  BNP: BNP (last 3 results) Recent Labs    03/09/22 1601 03/13/22 0951 03/16/22 1703  BNP 1,027.8* 710.8* 1,021.1*    ProBNP (last 3 results) No results for input(s): "PROBNP" in the last 8760 hours.   D-Dimer No results for input(s): "DDIMER" in the last 72 hours. Hemoglobin A1C Recent Labs    03/17/22 0041  HGBA1C 5.5   Fasting Lipid Panel Recent Labs    03/17/22 0041  CHOL 205*  HDL 47  LDLCALC 133*  TRIG 127  CHOLHDL 4.4   Thyroid Function Tests Recent Labs    03/17/22 0041  TSH 3.130    Other results:   Imaging    ECHO TEE  Result Date: 03/16/2022    TRANSESOPHOGEAL ECHO REPORT   Patient Name:   Ashley [name] Date of Exam: 03/16/2022  Medical Rec #:  [id number]           Height:       65.0 in Accession #:    [id number]          Weight:       170.0 lb Date of Birth:  [birth year]            BSA:          1.846 m Patient Age:    76 years            BP:           107/90 mmHg Patient Gender: F                   HR:           97 bpm. Exam Location:  Inpatient Procedure: Transesophageal Echo, Cardiac Doppler, Color Doppler and 3D Echo Indications:     I34.0 Nonrheumatic mitral (valve) insufficiency  History:         Patient has prior history of Echocardiogram examinations, most                  recent 03/10/2022. CHF and Cardiomyopathy, Defibrillator,                  Arrythmias:LBBB, Signs/Symptoms:Dizziness/Lightheadedness and                  Syncope; Risk Factors:Dyslipidemia. Severe mitral                  regurgitation.  Sonographer:     Roseanna Rainbow RDCS Referring Phys:  Shaune Pascal BENSIMHON Diagnosing Phys: Glori Bickers MD PROCEDURE: After discussion of the risks and benefits of a TEE, an informed consent was obtained from the patient. The transesophogeal probe was passed without difficulty through the esophogus of the patient. Imaged were obtained with the patient in a left lateral decubitus position. Sedation performed by different physician. The patient was monitored while under deep sedation. Anesthestetic sedation was provided intravenously by Anesthesiology: 263m of Propofol, 432mof Lidocaine. The patient's vital signs; including heart rate, blood pressure, and oxygen saturation; remained stable throughout the procedure. The patient developed no complications during the procedure.  IMPRESSIONS  1. Left ventricular ejection fraction, by estimation, is <20%. The left ventricle has severely decreased function. The left ventricular internal cavity size was severely dilated.  2. Right ventricular systolic function is moderately reduced. The right ventricular size is normal.  3. Left atrial size was severely  dilated. No left atrial/left atrial  appendage thrombus was detected.  4. Mitral annulus is dilated. There is posterior leaflet tethering with severe (4+) central MR with systolic flow reversal in pulmonary veins. The mitral valve is abnormal. Severe mitral valve regurgitation. No evidence of mitral stenosis.  5. The aortic valve is tricuspid. There is mild calcification of the aortic valve. Aortic valve regurgitation is trivial. Aortic valve sclerosis/calcification is present, without any evidence of aortic stenosis. FINDINGS  Left Ventricle: Left ventricular ejection fraction, by estimation, is <20%. The left ventricle has severely decreased function. The left ventricular internal cavity size was severely dilated. Right Ventricle: The right ventricular size is normal. No increase in right ventricular wall thickness. Right ventricular systolic function is moderately reduced. Left Atrium: Left atrial size was severely dilated. No left atrial/left atrial appendage thrombus was detected. Right Atrium: Right atrial size was normal in size. Pericardium: There is no evidence of pericardial effusion. Mitral Valve: Mitral annulus is dilated. There is posterior leaflet tethering with severe (4+) central MR with systolic flow reversal in pulmonary veins. The mitral valve is abnormal. Severe mitral valve regurgitation, with centrally-directed jet. No evidence of mitral valve stenosis. MV peak gradient, 9.1 mmHg. The mean mitral valve gradient is 4.0 mmHg. Tricuspid Valve: The tricuspid valve is normal in structure. Tricuspid valve regurgitation is mild. Aortic Valve: The aortic valve is tricuspid. There is mild calcification of the aortic valve. Aortic valve regurgitation is trivial. Aortic valve sclerosis/calcification is present, without any evidence of aortic stenosis. Pulmonic Valve: The pulmonic valve was normal in structure. Pulmonic valve regurgitation is trivial. Aorta: The aortic root is normal in size and structure. IAS/Shunts: No atrial level shunt  detected by color flow Doppler. Additional Comments: Spectral Doppler performed. MITRAL VALVE MV Peak grad: 9.1 mmHg MV Mean grad: 4.0 mmHg MV Vmax:      1.51 m/s MV Vmean:     88.2 cm/s MR Peak grad:    61.5 mmHg MR Mean grad:    34.0 mmHg MR Vmax:         392.00 cm/s MR Vmean:        265.0 cm/s MR PISA:         4.02 cm MR PISA Eff ROA: 38 mm MR PISA Radius:  0.80 cm Glori Bickers MD Electronically signed by Glori Bickers MD Signature Date/Time: 03/16/2022/12:56:02 PM    Final    CARDIAC CATHETERIZATION  Result Date: 03/16/2022 Findings: RA = 12 RV = 55/16 PA = 53/31 (40) PCW = 39 (v = 55) Fick cardiac output/index = 3.4/1.8 PVR = 0.3 WU Ao sat = 99% PA sat = 53%, 56% Assessment: 1. Normal coronary arteries with anomalous RCA 2. Severe LV dysfunction EF 15% due to NICM 3. Markedly elevated filling pressures with prominent v-waves in PCWP tracing c/w known severe MR Plan/Discussion: Admit for diuresis. Structural and VAD teams to see to help plan next steps. Suspect MV Clip may be best next step. Glori Bickers, MD 10:15 AM    Medications:     Scheduled Medications:  aspirin  81 mg Oral Once   enoxaparin (LOVENOX) injection  40 mg Subcutaneous Q24H   ezetimibe  10 mg Oral QHS   furosemide  80 mg Intravenous BID   Living Better with Heart Failure Book   Does not apply Once   losartan  12.5 mg Oral QHS   potassium chloride  40 mEq Oral Once   sodium chloride flush  3 mL Intravenous Q12H   sodium  chloride flush  3 mL Intravenous Q12H   spironolactone  25 mg Oral Daily    Infusions:  sodium chloride     sodium chloride      PRN Medications: sodium chloride, sodium chloride, acetaminophen, docusate sodium, ondansetron (ZOFRAN) IV, mouth rinse, sodium chloride flush, sodium chloride flush    Patient Profile   Ashley [name] is a 76 y.o.with a history of morbid obesity, HTN, COPD, GERD , fibromyaligia, 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 Presented for [facility], admitted for diuresis and plan to meet with structural and VAD team for next steps.   Assessment/Plan  1. Chronic Systolic Heart Failure - h/o NICM dating back to 2012 with fluctuating EF. EF improved to 60% in 19' then dropped again   - [facility] 6/20 with normal coronaries and mildly elevated filling pressures.  - Underwent CRT-D upgrade 6/20 but EF has not improved - Echo 6/20 EF 20%, Echo 10/21 EF 25%,  8/22 EF 30%, RV normal, 5/23 EF 25-30%. - Echo 10/23 EF 20-25% Severe MR - TEE today, results pending - L/RHC today: Normal coronaries with anomalous RCA, Severe LV dysfunction EF 15% due to NICM, Markedly elevated filling pressures with prominent v-waves in PCWP tracing c/w known severe MR - NYHA IIIb-IV.  - Volume status elevated on cath, CVP ~10 today. Continue 79m IV lasix BID - BP soft, d/c losartan for now to allow BP room for diuresis - Stop carvedilol 3.125 mg bid with low output - Continue spiro 25 mg daily - Did not tolerate FWilder Glade - VAD team met her while in clinic, will see again while admitted. - Watch Cr. 1.18 on admission, 1.31 today.  - Fick cardiac output/index = 3.4/1.8 on yesterdays cath. Place PICC, consider dobutamine 2.5. Trend co-ox and CVP.  - Strict I&O, daily weights    2. MR -Severe on ECHO  -TEE today: Mitral valve: Dilated annulus. Restricted P2. Severe central MR with several jets over P2 and P1. PISA 0.8cm. No stenosis. + systolic flow reversal in PVs.  -Structural team to see, possible MV clip  3. Hyperlipidemia - LDL 11/3 133, goal <50  - Continue Zetia, intolerant of statins  4. NSVT - 22 beats this am.  - Keep K >4 and Mg >2 - K 3.7 will replace. Mg 2.   Will ask speech to see for swallowing issues.   [place] of [place]: [email or web address]  03/17/2022, 7:28 AM  Advanced Heart Failure Team Pager [phone](M-F; 7a - 5p)  Please contact [facility] for night-coverage after hours  (5p -7a ) and weekends on [email or web address]

## 2022-03-17 NOTE — Evaluation (Signed)
Clinical/Bedside Swallow Evaluation Patient Details  Name: Ashley [name] MRN: [id number] Date of Birth: [birth year]  Today's Date: 03/17/2022 Time: SLP Start Time (ACUTE ONLY): 1343 SLP Stop Time (ACUTE ONLY): 1404 SLP Time Calculation (min) (ACUTE ONLY): 21 min  Past Medical History:  Past Medical History:  Diagnosis Date   Abdominal aortic atherosclerosis (HCC) 11/2015   by CT   Allergy    Anemia    Anxiety    Automatic implantable cardioverter-defibrillator in situ    a. 2012 s/p MDT single lead ICD; b. 10/2018 BiV upgrade-->MDT ser # [id number] H.   Bronchiectasis    a. possibly mild, treat URIs aggressively   Cancer (HCC)    basal cell, arms & neck   Chronic sinusitis    COPD (chronic obstructive pulmonary disease) (HCC)    COVID-19 12/06/2020   Depression    Dyspnea    Essential hypertension    Pt denies   Family history of adverse reaction to anesthesia    Mother - PONV   Fibrocystic breast disease    Fibromyalgia    GERD (gastroesophageal reflux disease)    H/O multiple allergies 10/10/2013   Hemorrhoids    HFrEF (heart failure with reduced ejection fraction) (HCC)    a. 06/2011 cMRI: EF 34%, no LGE; b. 01/2016 Echo: EF 35-40%; c. 07/2016 Echo: EF 40-45%; d. 02/2020 Echo: EF 20-25%, glob HK. GrI DD. Mildly red RV fxn. Mildly dil LA. Triv MR.   History of diverticulitis of colon    History of pneumonia 2012   Insomnia    Irritable bowel syndrome    Laryngeal nodule    Dr. Thelma Comp   Migraine without aura, without mention of intractable migraine without mention of status migrainosus    Mitral regurgitation    Monoclonal gammopathy    Neuropathy    feet and hands   Nonischemic cardiomyopathy (HCC)    a. 2012 s/p single lead ICD; b. 06/2011 cMRI: EF 34%, no LGE; b. 01/2016 Echo: EF 35-40%; c. 07/2016 Echo: EF 40-45%; d. 10/2018 Cath: nl cors. EF 20-25%; e. 10/2018 s/p BiV MDT ICD upgrade; f. 02/2020 Echo: EF 20-25%, glob HK. GrI DD.   Osteoarthritis    knees, back, hands,  low back,    Osteoporosis 01/2013   T-2.7 spine (01/2016)   PONV (postoperative nausea and vomiting)    sigmoid diverticulitis with perforation, abscess and fistula 09/30/2013   Sleep apnea 2010   borderline, inconclusive- /w     Past Surgical History:  Past Surgical History:  Procedure Laterality Date   ABDOMINAL HYSTERECTOMY  1987   w/BSO   ANAL RECTAL MANOMETRY N/A 02/02/2016   Procedure: ANO RECTAL MANOMETRY;  Surgeon: Napoleon Form, MD;  Location: [facility];  Service: Endoscopy;  Laterality: N/A;   APPENDECTOMY     AUGMENTATION MAMMAPLASTY     BIV UPGRADE N/A 11/04/2018   Procedure: BIV ICD  UPGRADE;  Surgeon: Marinus Maw, MD;  Location: [facility] INVASIVE CV LAB;  Service: Cardiovascular;  Laterality: N/A;   breast cystectomies     due to FCBD   BREAST MASS EXCISION  01/2010   Left-fibrocystic change w/intraductal papilloma, no malignancy   carotid US  03/2015   1-39% LICA, normal RICA   CATARACT EXTRACTION W/PHACO Right 03/08/2021   Procedure: CATARACT EXTRACTION PHACO AND INTRAOCULAR LENS PLACEMENT (IOC) RIGHT;  Surgeon: Galen Manila, MD;  Location: [facility];  Service: Ophthalmology;  Laterality: Right;  13.01 1:15.7   Che[place]ron Bunionectomy  11/04/08  Right Great Toe (Dr. Lestine Box)   COLONOSCOPY  01/2018   1 small benign polyp, diverticulosis, healthy anastomosis Sheppard Penton @ [facility])   COLOSTOMY N/A 10/02/2013   DESCENDING COLOSTOMY;  Surgeon: Wilmon Arms. Tsuei, MD   COLOSTOMY REVERSAL N/A 02/10/2014   Manus Rudd, MD   EMG/MCV  10/16/01   + mild carpal tunnel   ESOPHAGOGASTRODUODENOSCOPY  09/2014   small HH, irregular Z line, fundic gland polyps with mild gastritis Juanda Chance)   Flex laryngoscopy  06/11/06   (Juengel) nml   IMPLANTABLE CARDIOVERTER DEFIBRILLATOR IMPLANT N/A 07/14/2011   MDT single chamber ICD   KNEE ARTHROSCOPY W/ ORIF     Left; "meniscus tear"   LASIK     left eye   MANDIBLE SURGERY     "got about 6 pins in the bottom of my jaw"    NCS  11/2014   L ulnar neuropathy, mild B median nerve entrapment ([place]amos)   OSTEOTOMY     Left foot   PARTIAL COLECTOMY N/A 10/02/2013   PARTIAL COLECTOMY;  Surgeon: Wilmon Arms. Tsuei, MD   RIGHT/LEFT HEART CATH AND CORONARY ANGIOGRAPHY N/A 10/16/2018   Procedure: RIGHT/LEFT HEART CATH AND CORONARY ANGIOGRAPHY;  Surgeon: Iran Ouch, MD;  Location: [facility] INVASIVE CV LAB;  Service: Cardiovascular;  Laterality: N/A;   RIGHT/LEFT HEART CATH AND CORONARY ANGIOGRAPHY N/A 03/16/2022   Procedure: RIGHT/LEFT HEART CATH AND CORONARY ANGIOGRAPHY;  Surgeon: Dolores Patty, MD;  Location: [facility] INVASIVE CV LAB;  Service: Cardiovascular;  Laterality: N/A;   sinus surgery     x3 with balloon    TOE AMPUTATION     "toe beside baby toe on left foot; got gangrene from corn"   TONSILLECTOMY AND ADENOIDECTOMY     HPI:  Pt is a 76 y.o. who presented with acute on chronic HF for LVAD evaluation; pt subsequently determined to not be a candidate. SLP consulted by RD due to pt's report of "early satiety as well as feeling like her 'throat will close up' after only a few bites of food." PMH: morbid obesity, HTN, COPD, GERD, fibromyaligia, 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    Assessment / Plan / Recommendation  Clinical Impression  Pt was seen for bedside swallow evaluation with her husband present for part of the evaluation. Pt reported that she has had xerostomia for the past year. She also stated that since August she and has been having chest tightness, feeling satiated after ~4 boluses, and feels as though she "can't get enough air" and that there is a tightness in her throat during these episodes. Pt stated that she is unsure of the sequence of these symptoms but that she often feels dyspneic prior to the sensation of tightness in her throat. Pt initially denied any difficulty swallowing, but stated that these symptoms sometimes align  with p.o. intake and that she she intermittently feels "like it's tightening" when she swallows liquids.   Oral mechanism exam was [facility] and she presented with adequate, natural dentition. She tolerated all solids and liquids without signs or symptoms of oropharyngeal dysphagia. Considering pt's GERD which she reportedly treats with occasional antacids and pt's report of tightening, esophageal assessment (e.g., esophagram), and the potential impact on dyspnea on pt's swallow were discussed. Pt reported that she typically takes breaks from meals if she becomes dyspneic, and that she would like to defer an esophagram until her cardiac issues are better managed; SLP is in  agreement with this plan. A regular texture diet with thin liquids is recommended at this time with observance of precautions. Further acute skilled SLP services are not clinically indicated for swallowing. SLP Visit Diagnosis: Dysphagia, unspecified (R13.10)    Aspiration Risk       Diet Recommendation Regular;Thin liquid   Liquid Administration via: Cup;Straw Medication Administration: Whole meds with liquid Supervision: Patient able to self feed Postural Changes: Seated upright at 90 degrees    Other  Recommendations Recommended Consults: Consider esophageal assessment Oral Care Recommendations: Oral care BID    Recommendations for follow up therapy are one component of a multi-disciplinary discharge planning process, led by the attending physician.  Recommendations may be updated based on patient status, additional functional criteria and insurance authorization.  Follow up Recommendations No SLP follow up      Assistance Recommended at Discharge    Functional Status Assessment Patient has not had a recent decline in their functional status  Frequency and Duration            Prognosis        Swallow Study   General Date of Onset: 01/08/22 HPI: Pt is a 76 y.o. who presented with acute on chronic HF for LVAD  evaluation; pt subsequently determined to not be a candidate. SLP consulted by RD due to pt's report of "early satiety as well as feeling like her 'throat will close up' after only a few bites of food." PMH: morbid obesity, HTN, COPD, GERD, fibromyaligia, 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 Type of Study: Bedside Swallow Evaluation Previous Swallow Assessment: none Diet Prior to this Study: Regular;Thin liquids Temperature Spikes Noted: No Respiratory Status: Nasal cannula History of Recent Intubation: No Behavior/Cognition: Alert;Cooperative;Pleasant mood Oral Cavity Assessment: Within Functional Limits Oral Care Completed by SLP: No Oral Cavity - Dentition: Adequate natural dentition Vision: Functional for self-feeding Self-Feeding Abilities: Able to feed self Patient Positioning: Upright in bed;Postural control adequate for testing Baseline Vocal Quality: Normal Volitional Cough: Strong Volitional Swallow: Able to elicit    Oral/Motor/Sensory Function Overall Oral Motor/Sensory Function: Within functional limits   Ice Chips Ice chips: Within functional limits Presentation: Spoon   Thin Liquid Thin Liquid: Within functional limits Presentation: Straw    Nectar Thick Nectar Thick Liquid: Not tested   Honey Thick Honey Thick Liquid: Not tested   Puree Puree: Within functional limits Presentation: Spoon   Solid    Elwin Tsou I. Vear Clock, MS, CCC-SLP Acute Rehabilitation Services Office number [phone] Solid: Within functional limits Presentation: Spoon      Scheryl Marten 03/17/2022,3:18 PM

## 2022-03-17 NOTE — H&P (View-Only) (Signed)
[facility] VALVE TEAM  Inpatient MitraClip Consultation:   Patient ID: Ashley [name]; [id number]; 10-Mar-1946   Admit date: 03/16/2022 Date of Consult: 03/17/2022  Primary Care Provider: [name], MD Primary Cardiologist: Dr. [name], MD/ Dr. [name], MD  Primary Electrophysiologist:  Dr. Lovena Le, MD   Patient Profile:   Ashley [name] is a 76 y.o. female with a hx of morbid obesity, HTN, COPD, GERD, fibromyaligia, s/p partial colon resection for diverticulitis with re-connection, MGUS, LBBB and systolic HF due to NICM s/p CRT-D who is being seen today for the evaluation of MitraClip at the request of Dr. [name].   History of Present Illness:   Ms. Fei lives in [place] with her husband of many years. They have a son and two grandchildren who live in [place] who they spend time with often. She is active in her community despite ongoing cardiac issues. Until recently, she would play cards with friends once a week, volunteer, and drive to [place] to pick up her grandchildren from school several times per month. She has been somewhat restricted by her oxygen needs. More recently she reports increased issues with exertional dyspnea and fatigue. She does not use assistive devices for ambulation. She is now unable to walk across her kitchen without significant SOB to the point she will need to sit down to catch her breath. She has no chest pain, palpitations, LE edema, dizziness, or syncope. No active dental concerns.    Ms. Calandra is followed closely by the AHF clinic for her cardiology care. Her NICM dates back to 2012 when she was admitted with PNA 02/2011 found to have an LVEF at 25-30%. Medtronic ICD was initially placed 07/2011. By 2019 she had LV recovery to 60-65% however has since fluctuated in the 15-35% range since that time.    She was admitted 10/2018 with increase dyspnea and chest pain with CXR concerning for  interstitial edema. Bedside echocardiogram performed in the ED that showed a severely reduced LV function at 15% with severe mitral regurgitation. She underwent R/LHC at that time which showed normal coronaries and mildly elevated filling pressures. EKG with new LBBB therefore she underwent an upgrade to CRT-D during that admission. Echocardiogram from 09/2021 with EF at 25-30%. She has been maintained on ARB, torsemide, carvedilol, and [place].    She was admitted 12/2021 with CAP and acute CHF after stopping her diuretics. She was diuresed with IV Bumex and transitioned to oral torsemide. In follow up, she was having issues with exertional SOB and activity intolerance. There was discussion regarding possible PCX however felt this was not yet warranted.   She then saw Dr. [name] 03/09/22 with increased oxygen requirements and NYHA class III/IV symptoms. REDS reading was 44%. Plan was a trial of Furoscix and repeat echo with close follow up. On re-evaluation 03/13/22 she continued to be symptomatic despite therapies. Plan was [facility], discussion with LVAD coordinators, and TEE due to severe MR on echo.    [facility] 03/16/22 showed no significant CAD with anomalous RCA, severe LV dysfunction with EF at 15% and markedly elevated filling pressures with prominent v-waves in PCWP tracing c/w known severe MR. Subsequent TEE confirmed MR with posterior leaflet tethering with severe 4+ central MR with systolic flow reversal in the pulmonary veins.    TCTS was consulted due possible VAD implant and felt to have anatomy suitable however with concerns given hx of surgical site infection with delayed wound healing, extensive surgical complications, chronic O2  dependency, and multiple allergies.   After discussion with the patient and AHF team, plan is to hold off on VAD for now given comorbidities and age. Additionally there is concern about her ability to manage LVAD with her hx of anxiety regarding side effects and she  has been felt better suited for TEER evaluation.   Past Medical History:  Diagnosis Date   Abdominal aortic atherosclerosis ([place]) 11/2015   by CT   Allergy    Anemia    Anxiety    Automatic implantable cardioverter-defibrillator in situ    a. 2012 s/p MDT single lead ICD; b. 10/2018 BiV upgrade-->MDT ser # [id number] H.   Bronchiectasis    a. possibly mild, treat URIs aggressively   Cancer ([place])    basal cell, arms & neck   Chronic sinusitis    COPD (chronic obstructive pulmonary disease) ([place])    COVID-19 12/06/2020   Depression    Dyspnea    Essential hypertension    Pt denies   Family history of adverse reaction to anesthesia    Mother - PONV   Fibrocystic breast disease    Fibromyalgia    GERD (gastroesophageal reflux disease)    H/O multiple allergies 10/10/2013   Hemorrhoids    HFrEF (heart failure with reduced ejection fraction) ([place])    a. 06/2011 cMRI: EF 34%, no LGE; b. 01/2016 Echo: EF 35-40%; c. 07/2016 Echo: EF 40-45%; d. 02/2020 Echo: EF 20-25%, glob HK. GrI DD. Mildly red RV fxn. Mildly dil LA. Triv MR.   History of diverticulitis of colon    History of pneumonia 2012   Insomnia    Irritable bowel syndrome    Laryngeal nodule    Dr. Thomasena Edis   Migraine without aura, without mention of intractable migraine without mention of status migrainosus    Mitral regurgitation    Monoclonal gammopathy    Neuropathy    feet and hands   Nonischemic cardiomyopathy ([place])    a. 2012 s/p single lead ICD; b. 06/2011 cMRI: EF 34%, no LGE; b. 01/2016 Echo: EF 35-40%; c. 07/2016 Echo: EF 40-45%; d. 10/2018 Cath: nl cors. EF 20-25%; e. 10/2018 s/p BiV MDT ICD upgrade; f. 02/2020 Echo: EF 20-25%, glob HK. GrI DD.   Osteoarthritis    knees, back, hands, low back,    Osteoporosis 01/2013   T-2.7 spine (01/2016)   PONV (postoperative nausea and vomiting)    sigmoid diverticulitis with perforation, abscess and fistula 09/30/2013   Sleep apnea 2010   borderline, inconclusive- /w [place]      Past Surgical History:  Procedure Laterality Date   ABDOMINAL HYSTERECTOMY  1987   w/BSO   ANAL RECTAL MANOMETRY N/A 02/02/2016   Procedure: ANO RECTAL MANOMETRY;  Surgeon: Mauri Pole, MD;  Location: [facility];  Service: Endoscopy;  Laterality: N/A;   APPENDECTOMY     AUGMENTATION MAMMAPLASTY     BIV UPGRADE N/A 11/04/2018   Procedure: BIV ICD  UPGRADE;  Surgeon: Evans Lance, MD;  Location: [facility];  Service: Cardiovascular;  Laterality: N/A;   breast cystectomies     due to FCBD   BREAST MASS EXCISION  01/2010   Left-fibrocystic change w/intraductal papilloma, no malignancy   carotid US  26/8341   9-62% LICA, normal RICA   CATARACT EXTRACTION W/PHACO Right 03/08/2021   Procedure: CATARACT EXTRACTION PHACO AND INTRAOCULAR LENS PLACEMENT ([place]) RIGHT;  Surgeon: Birder Robson, MD;  Location: [facility];  Service: Ophthalmology;  Laterality: Right;  13.01  1:15.7   [place] Bunionectomy  11/04/08   Right Great Toe (Dr. Beola Cord)   COLONOSCOPY  01/2018   1 small benign polyp, diverticulosis, healthy anastomosis [name] @ [place])   COLOSTOMY N/A 10/02/2013   DESCENDING COLOSTOMY;  Surgeon: Imogene Burn. Worthington, MD   COLOSTOMY REVERSAL N/A 02/10/2014   [name], MD   EMG/MCV  10/16/01   + mild carpal tunnel   ESOPHAGOGASTRODUODENOSCOPY  09/2014   small HH, irregular Z line, fundic gland polyps with mild gastritis Olevia Perches)   Flex laryngoscopy  06/11/06   ([place]) nml   IMPLANTABLE CARDIOVERTER DEFIBRILLATOR IMPLANT N/A 07/14/2011   MDT single chamber ICD   KNEE ARTHROSCOPY W/ ORIF     Left; "meniscus tear"   LASIK     left eye   MANDIBLE SURGERY     "got about 6 pins in the bottom of my jaw"   NCS  11/2014   L ulnar neuropathy, mild B median nerve entrapment ([place])   OSTEOTOMY     Left foot   PARTIAL COLECTOMY N/A 10/02/2013   PARTIAL COLECTOMY;  Surgeon: Imogene Burn. Tsuei, MD   RIGHT/LEFT HEART CATH AND CORONARY ANGIOGRAPHY N/A 10/16/2018   Procedure:  RIGHT/LEFT HEART CATH AND CORONARY ANGIOGRAPHY;  Surgeon: Wellington Hampshire, MD;  Location: [facility];  Service: Cardiovascular;  Laterality: N/A;   RIGHT/LEFT HEART CATH AND CORONARY ANGIOGRAPHY N/A 03/16/2022   Procedure: RIGHT/LEFT HEART CATH AND CORONARY ANGIOGRAPHY;  Surgeon: Jolaine Artist, MD;  Location: [facility];  Service: Cardiovascular;  Laterality: N/A;   sinus surgery     x3 with balloon    TOE AMPUTATION     "toe beside baby toe on left foot; got gangrene from corn"   TONSILLECTOMY AND ADENOIDECTOMY       Inpatient Medications: Scheduled Meds:  aspirin  81 mg Oral Once   enoxaparin (LOVENOX) injection  40 mg Subcutaneous Q24H   ezetimibe  10 mg Oral QHS   furosemide  80 mg Intravenous BID   Living Better with Heart Failure Book   Does not apply Once   potassium chloride  40 mEq Oral Once   sodium chloride flush  3 mL Intravenous Q12H   sodium chloride flush  3 mL Intravenous Q12H   Continuous Infusions:  sodium chloride     sodium chloride     PRN Meds: sodium chloride, sodium chloride, acetaminophen, ALPRAZolam, docusate sodium, ondansetron (ZOFRAN) IV, mouth rinse, sodium chloride flush, sodium chloride flush  Allergies:    Allergies  Allergen Reactions   Chlorhexidine Other (See Comments)    blisters   Codeine Other (See Comments)    Confusion and dizziness   Cyclobenzaprine Other (See Comments)    Adverse reaction - back and throat pain, dizziness, exhaustion   Cymbalta [Duloxetine Hcl] Other (See Comments)    "body spasms and made me feel weird in my head"    Diflucan [Fluconazole] Nausea And Vomiting   Dilaudid [Hydromorphone Hcl] Other (See Comments)    Feels like she is burning internally    Influenza Vac Split [Influenza Virus Vaccine] Other (See Comments)    "allergic to concentrated eggs that are in vaccine"   Lasix [Furosemide] Other (See Comments)    Severe hypotension on lasix 40 PO (can handle 20 mg oral dosing) BP 35/22  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    Latex Nausea Only, Rash and Other (See Comments)    Pain and infection   Lipitor [Atorvastatin] Other (See Comments)    Severe  muscle cramps to generic atorvastatin.  Able to take brand lipitor.   Morphine And Related Other (See Comments)    Internally feels like she is on fire    Pravastatin Other (See Comments)    myalgias   Prevacid [Lansoprazole] Other (See Comments)    Worsened GI side effects   Prevnar [Pneumococcal 13-Val Conj Vacc] Other (See Comments)    Egg allergy Bad reaction after shot   Rosuvastatin Other (See Comments)    Was not effective controlling lipids   Simvastatin Other (See Comments)    Leg cramps   Tape Other (See Comments) and Rash    All adhesives  - Blisters, itching and burning  blistering   Farxiga [Dapagliflozin] Other (See Comments)    Unknown reaction    Metaxalone Other (See Comments)    Unknown reaction    Tramadol     Leg cramps   Antihistamines, Chlorpheniramine-Type Other (See Comments)    Alters vision   Betadine [Povidone Iodine] Rash    Per pt.    Onion Diarrhea and Other (See Comments)    GI distress, flared IBS   Other Hives and Rash    NOTE: pt is able to take cephalosporins without reaction   Penicillins Other (See Comments)    CHILDHOOD ALLEGY/REACTION: "told 50 years ago I couldn't take it; might be immune to it", able to take amoxicillin Has patient had a PCN reaction causing immediate rash, facial/tongue/throat swelling, SOB or lightheadedness with hypotension: Unknown Has patient had a PCN reaction causing severe rash involving mucus membranes or skin necrosis: UnknowN Has patient had a PCN reaction that required hospitalization: /Unknown Has patient had a PCN reaction occurring within the last 10 years: Unknown If a   Red Dye Nausea Only and Swelling   Sertraline Other (See Comments)    Overly sedating   Sulfasalazine Other (See Comments)    "makes my whole body smell like sulfa & makes me sick just  smelling it"    Social History:   Social History   Socioeconomic History   Marital status: Married    Spouse name: Not on file   Number of children: 1   Years of education: Not on file   Highest education level: Not on file  Occupational History   Occupation: Oncologist   Occupation: Aeronautical engineer  Tobacco Use   Smoking status: Never   Smokeless tobacco: Never   Tobacco comments:    Lived with smokers x 23 yrs, smoked herself "for a week"  Vaping Use   Vaping Use: Never used  Substance and Sexual Activity   Alcohol use: No    Alcohol/week: 0.0 standard drinks of alcohol   Drug use: No   Sexual activity: Not Currently  Other Topics Concern   Not on file  Social History Narrative   Lives with husband Herbie [place]   1 adopted child   Social Determinants of Health   Financial Resource Strain: Medium Risk (05/24/2021)   Overall Financial Resource Strain (CARDIA)    Difficulty of Paying Living Expenses: Somewhat hard  Food Insecurity: No Food Insecurity (12/01/2019)   Hunger Vital Sign    Worried About Running Out of Food in the Last Year: Never true    Ran Out of Food in the Last Year: Never true  Transportation Needs: No Transportation Needs (02/15/2022)   PRAPARE - Hydrologist (Medical): No    Lack of Transportation (Non-Medical): No  Physical Activity:  Insufficiently Active (12/01/2019)   Exercise Vital Sign    Days of Exercise per Week: 3 days    Minutes of Exercise per Session: 30 min  Stress: Stress Concern Present (12/01/2019)   [place]    Feeling of Stress : Rather much  Social Connections: Not on file  Intimate Partner Violence: Not At Risk (12/01/2019)   Humiliation, Afraid, Rape, and Kick questionnaire    Fear of Current or Ex-Partner: No    Emotionally Abused: No    Physically Abused: No    Sexually Abused: No    Family  History:   The patient's family history includes Alcohol abuse in her paternal grandfather; Arthritis in her mother; Colon polyps in her father; Dementia in her mother; Emphysema in her mother; Heart disease in her paternal grandfather and paternal grandmother; Hypertension in her maternal grandfather; Lung cancer in her father; Osteoporosis in her mother; Stroke in her mother and paternal grandfather. There is no history of Colon cancer, Esophageal cancer, or Gallbladder disease.  ROS:  Please see the history of present illness.  ROS  All other ROS reviewed and negative.     Physical Exam/Data:   Vitals:   03/17/22 0600 03/17/22 0738 03/17/22 1200 03/17/22 1300  BP:  94/61 103/60 (!) 94/57  Pulse:  82 98 99  Resp:  17 (!) 21 20  Temp:  97.6 F (36.4 C)  97.6 F (36.4 C)  TempSrc:  Oral  Oral  SpO2:  96% 92% 94%  Weight: 76.1 kg     Height:        Intake/Output Summary (Last 24 hours) at 03/17/2022 1351 Last data filed at 03/17/2022 1327 Gross per 24 hour  Intake 1720 ml  Output 1300 ml  Net 420 ml   Filed Weights   03/16/22 0751 03/17/22 0600  Weight: 77.1 kg 76.1 kg   Body mass index is 27.92 kg/m.   General: Well developed, well nourished, NAD Neck: Negative for carotid bruits. No JVD Lungs:Clear to ausculation bilaterally. No wheezes, rales, or rhonchi. Breathing is unlabored. Cardiovascular: RRR with S1 S2.  Distant 2/6 holosystolic murmur at the apex Abdomen: Soft, non-tender, non-distended with normoactive bowel sounds. No hepatomegaly, No rebound/guarding. No obvious abdominal masses. MSK: Strength and tone appear normal for age. 5/5 in all extremities Extremities: No edema. No clubbing or cyanosis. DP/PT pulses 2+ bilaterally Neuro: Alert and oriented. No focal deficits. No facial asymmetry. MAE spontaneously. Psych: Responds to questions appropriately with normal affect.       EKG:  The EKG was personally reviewed and demonstrates:  Atrial sensed, ventricular  paced, HR 75bpm Telemetry:  Telemetry was personally reviewed and demonstrates: AV paced with HR in the 80-90s   Relevant CV Studies:  [facility] 03/16/22:   Findings:   RA = 12 RV = 55/16 PA = 53/31 (40) PCW = 39 (v = 55) Fick cardiac output/index = 3.4/1.8 PVR = 0.3 WU  Ao sat = 99% PA sat = 53%, 56%   Assessment: 1. Normal coronary arteries with anomalous RCA 2. Severe LV dysfunction EF 15% due to NICM 3. Markedly elevated filling pressures with prominent v-waves in PCWP tracing c/w known severe MR   Plan/Discussion:   Admit for diuresis. Structural and VAD teams to see to help plan next steps. Suspect MV Clip may be best next step.    TEE 03/16/22:   1. Left ventricular ejection fraction, by estimation, is <20%. The left  ventricle has  severely decreased function. The left ventricular internal  cavity size was severely dilated.   2. Right ventricular systolic function is moderately reduced. The right  ventricular size is normal.   3. Left atrial size was severely dilated. No left atrial/left atrial  appendage thrombus was detected.   4. Mitral annulus is dilated. There is posterior leaflet tethering with  severe (4+) central MR with systolic flow reversal in pulmonary veins. The  mitral valve is abnormal. Severe mitral valve regurgitation. No evidence  of mitral stenosis.   5. The aortic valve is tricuspid. There is mild calcification of the  aortic valve. Aortic valve regurgitation is trivial. Aortic valve  sclerosis/calcification is present, without any evidence of aortic stenosis.   Echocardiogram 03/10/22:   Left ventricular ejection fraction, by estimation, is 20 to 25%. The left ventricle has severely decreased function. The left ventricle demonstrates global hypokinesis. The left ventricular internal cavity size was mildly to moderately dilated. Left ventricular diastolic parameters are consistent with Grade III diastolic dysfunction (restrictive). 1. 2. Right  ventricular systolic function is normal. The right ventricular size is normal. 3. Left atrial size was moderately dilated. The mitral valve is normal in structure. Severe mitral valve regurgitation. No evidence of mitral stenosis. 4. The aortic valve is tricuspid. There is mild calcification of the aortic valve. Aortic valve regurgitation is not visualized. No aortic stenosis is present. 5. The inferior vena cava is normal in size with greater than 50% respiratory variability, suggesting right atrial pressure of 3 mmHg.  Laboratory Data:  Chemistry Recent Labs  Lab 03/13/22 0951 03/16/22 0940 03/16/22 0949 03/16/22 1538 03/17/22 0041  NA 135   < > 139 140 135  K 3.9   < > 4.6 4.2 3.7  CL 100  --   --  100 98  CO2 24  --   --  23 23  GLUCOSE 160*  --   --  114* 109*  BUN 23  --   --  25* 28*  CREATININE 1.24*  --   --  1.18* 1.31*  CALCIUM 9.7  --   --  10.2 9.5  GFRNONAA 45*  --   --  48* 42*  ANIONGAP 11  --   --  17* 14   < > = values in this interval not displayed.    Recent Labs  Lab 03/16/22 1538  PROT 7.7  ALBUMIN 3.9  AST 23  ALT 27  ALKPHOS 92  BILITOT 0.5   Hematology Recent Labs  Lab 03/13/22 0951 03/16/22 0940 03/16/22 0943 03/16/22 0949 03/16/22 1703  WBC 9.8  --   --   --  13.4*  RBC 4.03  --   --   --  4.15  HGB 12.0   < > 11.2* 11.2* 12.7  HCT 38.1   < > 33.0* 33.0* 37.8  MCV 94.5  --   --   --  91.1  MCH 29.8  --   --   --  30.6  MCHC 31.5  --   --   --  33.6  RDW 12.8  --   --   --  12.7  PLT 246  --   --   --  275   < > = values in this interval not displayed.   Cardiac EnzymesNo results for input(s): "TROPONINI" in the last 168 hours. No results for input(s): "TROPIPOC" in the last 168 hours.  BNP Recent Labs  Lab 03/13/22 0951 03/16/22 1703  BNP 710.8*  1,021.1*    DDimer No results for input(s): "DDIMER" in the last 168 hours.  Radiology/Studies:  [place] EKG SITE RITE  Result Date: 03/17/2022 If Site Rite image not attached,  placement could not be confirmed due to current cardiac rhythm.  ECHO TEE  Result Date: 03/16/2022    TRANSESOPHOGEAL ECHO REPORT   Patient Name:   HASET OAXACA Younis Date of Exam: 03/16/2022 Medical Rec #:  [id number]           Height:       65.0 in Accession #:    [id number]          Weight:       170.0 lb Date of Birth:  [birth year]            BSA:          1.846 m Patient Age:    76 years            BP:           107/90 mmHg Patient Gender: F                   HR:           97 bpm. Exam Location:  Inpatient Procedure: Transesophageal Echo, Cardiac Doppler, Color Doppler and 3D Echo Indications:     I34.0 Nonrheumatic mitral (valve) insufficiency  History:         Patient has prior history of Echocardiogram examinations, most                  recent 03/10/2022. CHF and Cardiomyopathy, Defibrillator,                  Arrythmias:LBBB, Signs/Symptoms:Dizziness/Lightheadedness and                  Syncope; Risk Factors:Dyslipidemia. Severe mitral                  regurgitation.  Sonographer:     Roseanna Rainbow RDCS Referring Phys:  Shaune Pascal BENSIMHON Diagnosing Phys: Glori Bickers MD PROCEDURE: After discussion of the risks and benefits of a TEE, an informed consent was obtained from the patient. The transesophogeal probe was passed without difficulty through the esophogus of the patient. Imaged were obtained with the patient in a left lateral decubitus position. Sedation performed by different physician. The patient was monitored while under deep sedation. Anesthestetic sedation was provided intravenously by Anesthesiology: '250mg'$  of Propofol, '40mg'$  of Lidocaine. The patient's vital signs; including heart rate, blood pressure, and oxygen saturation; remained stable throughout the procedure. The patient developed no complications during the procedure.  IMPRESSIONS  1. Left ventricular ejection fraction, by estimation, is <20%. The left ventricle has severely decreased function. The left ventricular internal cavity size  was severely dilated.  2. Right ventricular systolic function is moderately reduced. The right ventricular size is normal.  3. Left atrial size was severely dilated. No left atrial/left atrial appendage thrombus was detected.  4. Mitral annulus is dilated. There is posterior leaflet tethering with severe (4+) central MR with systolic flow reversal in pulmonary veins. The mitral valve is abnormal. Severe mitral valve regurgitation. No evidence of mitral stenosis.  5. The aortic valve is tricuspid. There is mild calcification of the aortic valve. Aortic valve regurgitation is trivial. Aortic valve sclerosis/calcification is present, without any evidence of aortic stenosis. FINDINGS  Left Ventricle: Left ventricular ejection fraction, by estimation, is <20%. The left ventricle has severely decreased function. The left ventricular  internal cavity size was severely dilated. Right Ventricle: The right ventricular size is normal. No increase in right ventricular wall thickness. Right ventricular systolic function is moderately reduced. Left Atrium: Left atrial size was severely dilated. No left atrial/left atrial appendage thrombus was detected. Right Atrium: Right atrial size was normal in size. Pericardium: There is no evidence of pericardial effusion. Mitral Valve: Mitral annulus is dilated. There is posterior leaflet tethering with severe (4+) central MR with systolic flow reversal in pulmonary veins. The mitral valve is abnormal. Severe mitral valve regurgitation, with centrally-directed jet. No evidence of mitral valve stenosis. MV peak gradient, 9.1 mmHg. The mean mitral valve gradient is 4.0 mmHg. Tricuspid Valve: The tricuspid valve is normal in structure. Tricuspid valve regurgitation is mild. Aortic Valve: The aortic valve is tricuspid. There is mild calcification of the aortic valve. Aortic valve regurgitation is trivial. Aortic valve sclerosis/calcification is present, without any evidence of aortic stenosis.  Pulmonic Valve: The pulmonic valve was normal in structure. Pulmonic valve regurgitation is trivial. Aorta: The aortic root is normal in size and structure. IAS/Shunts: No atrial level shunt detected by color flow Doppler. Additional Comments: Spectral Doppler performed. MITRAL VALVE MV Peak grad: 9.1 mmHg MV Mean grad: 4.0 mmHg MV Vmax:      1.51 m/s MV Vmean:     88.2 cm/s MR Peak grad:    61.5 mmHg MR Mean grad:    34.0 mmHg MR Vmax:         392.00 cm/s MR Vmean:        265.0 cm/s MR PISA:         4.02 cm MR PISA Eff ROA: 38 mm MR PISA Radius:  0.80 cm Glori Bickers MD Electronically signed by Glori Bickers MD Signature Date/Time: 03/16/2022/12:56:02 PM    Final    CARDIAC CATHETERIZATION  Result Date: 03/16/2022 Findings: RA = 12 RV = 55/16 PA = 53/31 (40) PCW = 39 (v = 55) Fick cardiac output/index = 3.4/1.8 PVR = 0.3 WU Ao sat = 99% PA sat = 53%, 56% Assessment: 1. Normal coronary arteries with anomalous RCA 2. Severe LV dysfunction EF 15% due to NICM 3. Markedly elevated filling pressures with prominent v-waves in PCWP tracing c/w known severe MR Plan/Discussion: Admit for diuresis. Structural and VAD teams to see to help plan next steps. Suspect MV Clip may be best next step. Glori Bickers, MD 10:15 AM   STS Risk Calculator:   Procedure Type: Isolated MVR Perioperative Outcome           Estimate % Operative Mortality      6.51% Morbidity & Mortality   21% Stroke  2.41% Renal Failure   4.1% Reoperation    4.24% Prolonged Ventilation  15.7% Deep Sternal Wound Infection           0.069% [facility] Stay (>14 days)           11% Short Hospital Stay (<6 days)*           16.7%   Procedure Type: Isolated MVr Perioperative Outcome           Estimate % Operative Mortality      6.37% Morbidity & Mortality   15.3% Stroke  1.93% Renal Failure   1.97% Reoperation    3.56% Prolonged Ventilation  11.1% Deep Sternal Wound Infection           0.07% [facility] Stay (>14 days)            9.19% Short  Hospital Stay (<6 days)*           24.9%   [facility] Cardiomyopathy Questionnaire      03/17/2022    1:35 PM  KCCQ-12  1 a. Ability to shower/bathe Quite a bit limited  1 b. Ability to walk 1 block Extremely limited  1 c. Ability to hurry/jog Other, Did not do  2. Edema feet/ankles/legs Never over the past 2 weeks  3. Limited by fatigue Several times a day  4. Limited by dyspnea Several times a day  5. Sitting up / on 3+ pillows 1-2 times a week  6. Limited enjoyment of life Extremely limited  7. Rest of life w/ symptoms Mostly dissatisfied  8 a. Participation in hobbies Severely limited  8 b. Participation in chores Severely limited  8 c. Visiting family/friends Severely limited   Assessment and Plan:   Ashley [name] is a 76 y.o. female with symptoms of severe, stage D mitral regurgitation with NYHA Class IV symptoms. I have reviewed the patient's recent echocardiogram which is notable for severe LV dysfunction with an EF at 20-25% with GIIIDD, and severe mitral regurgitation. 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 R/LHC with no CAD and markedly elevated filling pressures with prominent V-waves in PCWP tracings.    I have reviewed the natural history of mitral regurgitation with the patient. We have discussed the limitations of medical therapy and the poor prognosis associated with symptomatic mitral regurgitation. We have also reviewed potential treatment options, including palliative medical therapy, conventional surgical mitral valve repair or replacement, and percutaneous mitral valve repair with MitraClip. We discussed treatment options in the context of this patient's specific comorbid medical conditions.    The patient's predicted risk of mortality with conventional mitral valve replacement/repair is 6.51 % / 6.37 %  respectively, primarily based on severe NICM with EF at <20%, acute on chronic CHF, and chronic O2 dependency. Also with concerns regarding hx of surgical site infection with delayed wound healing, extensive surgical complications, and multiple allergies.    Will send TEE imaging to industry for full review and MD to follow with final recommendations.     Signed, Kathyrn Drown, NP  03/17/2022 1:51 PM   Patient seen, examined. Available data reviewed. Agree with findings, assessment, and plan as outlined by Kathyrn Drown, NP.  The patient is independently interviewed and examined this morning.  Her husband is at the bedside.  On my examination, she is alert, oriented, in no distress.  HEENT is normal, neck with mildly elevated JVP, lungs are diminished in the bases with scattered rhonchi, heart is regular rate and rhythm with somewhat distant heart sounds and a 2/6 holosystolic murmur at the apex, abdomen is soft and nontender, extremities have no edema, skin is warm and dry with no rash, neurologic is grossly intact with 5/5 strength in the arms and legs bilaterally.  EKG shows an a sensed V paced rhythm.  2D echo shows severe global LV systolic dysfunction with LVEF 20 to 25%, normal RV size and function, mild to moderate LV dilatation, severe mitral regurgitation, no evidence of mitral stenosis, and no significant aortic valvular disease.  Transesophageal echo images are personally reviewed and demonstrate severe LV dysfunction, moderate RV dysfunction, severe left atrial dilatation, and severe mitral regurgitation.  The patient reports progressive NYHA functional class III-IV symptoms of acute on chronic systolic  heart failure worsening over the past few weeks, now on continuous home oxygen with decreased functional ability, marked fatigue, worsening dyspnea, and orthopnea.  I reviewed all available data.  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  Based on the presence of worsening heart failure and severe secondary mitral regurgitation, I think it is appropriate to consider transcatheter edge-to-edge repair of the mitral valve with a goal of reducing mitral regurgitation, improving quality of life and reducing risk of worsening heart failure.  I think the posterior leaflet of the mitral valve is sufficient for transcatheter edge-to-edge repair and the procedure could likely be performed with a good success rate.  I reviewed the risks, indications, and alternatives to transcatheter edge-to-edge repair of the mitral valve with MitraClip.  A shared decision-making conversation with the patient and her husband occurs today.  They understand that potential risks include but are not limited to vascular injury, bleeding, infection, stroke, myocardial infarction, device embolization, single leaflet device attachment, arrhythmia, hemopericardium, need for emergent pericardiocentesis, need for emergency surgery, and death.  They understand the risk of serious complication occur at an incidence of approximately 1 to 2%.  They would like to proceed at the next available time.  I will review her case with our multidisciplinary heart valve team early this week.  We will do our best to facilitate a MitraClip procedure as soon as possible, likely in the next 2 weeks.  We will be in close touch with the patient after her case is reviewed at our heart team meeting and we will give her instructions for scheduling at that time.  Please reach out if we  can be of any further assistance in her care.  Thanks  Sherren Mocha, M.D. 03/18/2022 11:02 AM

## 2022-03-17 NOTE — Progress Notes (Signed)
Initial Nutrition Assessment  DOCUMENTATION CODES:   Not applicable  INTERVENTION:  - Await SLP evaluation for diet texture recommendations - Ensure Enlive po BID if patient remains on diet, each supplement provides 350 kcal and 20 grams of protein.   NUTRITION DIAGNOSIS:   Inadequate oral intake related to poor appetite, early satiety as evidenced by per patient/family report.  GOAL:   Patient will meet greater than or equal to 90% of their needs  MONITOR:   PO intake, Supplement acceptance  REASON FOR ASSESSMENT:   Consult LVAD Eval  ASSESSMENT:   76 y.o. female with PMH HF, COPD, GERD, partial colon resection for diverticulitis who presented with acute on chronic HF with evaluation of next steps, including eval for VAD.   Patient sitting up in bed at time of visit this AM. She reports a recent UBW of 170# but notes she was weighing in the 180s until August when she got sick with pneumonia and had fluid taken off.  Has been trying to eat 3 meals but endorses early satiety as well as feeling like her "throat will close up" after only a few bites of food. SLP consulted to see patient today. Appetite has also been poor. Patient states her current appetite is good but she is afraid to eat with the feeling in her throat. States she had some french toast and [place] sausage for breakfast this morning. Also reports dry mouth and fluid "in her gut". Discussed trying small and frequent meals and snacks, eating moist foods, and trying nutrition supplements to support intake, pending SLP recs.   *Of note, per progress note today, plan to hold off on LVAD evaluation due to age and comorbidities.   Medications reviewed and include: Lasix, Colace prn  Labs reviewed:  Creatinine 1.31 (H) GFR 42 (L)     NUTRITION - FOCUSED PHYSICAL EXAM:  Flowsheet Row Most Recent Value  Orbital Region Mild depletion  Upper Arm Region No depletion  Thoracic and Lumbar Region No depletion  Buccal  Region No depletion  Temple Region Mild depletion  Clavicle Bone Region Mild depletion  Clavicle and Acromion Bone Region Mild depletion  Scapular Bone Region Unable to assess  Dorsal Hand No depletion  Patellar Region No depletion  Anterior Thigh Region No depletion  Posterior Calf Region No depletion  Edema (RD Assessment) Mild  Hair Reviewed  Eyes Reviewed  Mouth Reviewed  Skin Reviewed  Nails Reviewed       Diet Order:   Diet Order             Diet Heart Room service appropriate? Yes; Fluid consistency: Thin  Diet effective now                   EDUCATION NEEDS:   No education needs have been identified at this time  Skin:  Skin Assessment: Reviewed RN Assessment  Last BM:  PTA  Height:  Ht Readings from Last 1 Encounters:  03/16/22 '5\' 5"'$  (1.651 m)    Weight:  Wt Readings from Last 1 Encounters:  03/17/22 76.1 kg    BMI:  Body mass index is 27.92 kg/m.  Estimated Nutritional Needs:  Kcal:  1900-2300 kcal Protein:  75-90 grams Fluid:  1900-2300 mL    Samson Frederic RD, LDN For contact information, refer to [facility].

## 2022-03-17 NOTE — Progress Notes (Signed)
TCTS Brief Note:  Patient seen today and basics of LVAD surgery and postoperative care discussed with patient and family member.   Work-up is ongoing.  Full note to follow.

## 2022-03-17 NOTE — Consult Note (Cosign Needed Addendum)
[facility]   MULTIDISCIPLINARY HEART VALVE TEAM  Inpatient MitraClip Consultation:   Patient ID: Ashley [name]; [id number]; 12-Apr-1946   Admit date: 03/16/2022 Date of Consult: 03/17/2022  Primary Care Provider: [name], MD Primary Cardiologist: Dr. Mariah Milling, MD/ Dr. Gala Romney, MD  Primary Electrophysiologist:  Dr. Ladona Ridgel, MD   Patient Profile:   Ashley [name] is a 76 y.o. female with a hx of morbid obesity, HTN, COPD, GERD, fibromyaligia, s/p partial colon resection for diverticulitis with re-connection, MGUS, LBBB and systolic HF due to NICM s/p CRT-D who is being seen today for the evaluation of MitraClip at the request of Dr. Gala Romney.   History of Present Illness:   Ashley [name] lives in [place] with her husband of many years. They have a son and two grandchildren who live in [place] who they spend time with often. She is active in her community despite ongoing cardiac issues. Until recently, she would play cards with friends once a week, volunteer, and drive to [place] to pick up her grandchildren from school several times per month. She has been somewhat restricted by her oxygen needs. More recently she reports increased issues with exertional dyspnea and fatigue. She does not use assistive devices for ambulation. She is now unable to walk across her kitchen without significant SOB to the point she will need to sit down to catch her breath. She has no chest pain, palpitations, LE edema, dizziness, or syncope. No active dental concerns.    Ashley [name] is followed closely by the AHF clinic for her cardiology care. Her NICM dates back to 2012 when she was admitted with PNA 02/2011 found to have an LVEF at 25-30%. Medtronic ICD was initially placed 07/2011. By 2019 she had LV recovery to 60-65% however has since fluctuated in the 15-35% range since that time.    She was admitted 10/2018 with increase dyspnea and chest pain with CXR concerning for  interstitial edema. Bedside echocardiogram performed in the ED that showed a severely reduced LV function at 15% with severe mitral regurgitation. She underwent R/LHC at that time which showed normal coronaries and mildly elevated filling pressures. EKG with new LBBB therefore she underwent an upgrade to CRT-D during that admission. Echocardiogram from 09/2021 with EF at 25-30%. She has been maintained on ARB, torsemide, carvedilol, and [place].    She was admitted 12/2021 with CAP and acute CHF after stopping her diuretics. She was diuresed with IV Bumex and transitioned to oral torsemide. In follow up, she was having issues with exertional SOB and activity intolerance. There was discussion regarding possible PCX however felt this was not yet warranted.   She then saw Dr. Gala Romney 03/09/22 with increased oxygen requirements and NYHA class III/IV symptoms. REDS reading was 44%. Plan was a trial of Furoscix and repeat echo with close follow up. On re-evaluation 03/13/22 she continued to be symptomatic despite therapies. Plan was [facility], discussion with LVAD coordinators, and TEE due to severe MR on echo.    [facility] 03/16/22 showed no significant CAD with anomalous RCA, severe LV dysfunction with EF at 15% and markedly elevated filling pressures with prominent v-waves in PCWP tracing c/w known severe MR. Subsequent TEE confirmed MR with posterior leaflet tethering with severe 4+ central MR with systolic flow reversal in the pulmonary veins.    TCTS was consulted due possible VAD implant and felt to have anatomy suitable however with concerns given hx of surgical site infection with delayed wound healing, extensive surgical complications, chronic O2  dependency, and multiple allergies.   After discussion with the patient and AHF team, plan is to hold off on VAD for now given comorbidities and age. Additionally there is concern about her ability to manage LVAD with her hx of anxiety regarding side effects and she  has been felt better suited for TEER evaluation.   Past Medical History:  Diagnosis Date   Abdominal aortic atherosclerosis (HCC) 11/2015   by CT   Allergy    Anemia    Anxiety    Automatic implantable cardioverter-defibrillator in situ    a. 2012 s/p MDT single lead ICD; b. 10/2018 BiV upgrade-->MDT ser # [id number] H.   Bronchiectasis    a. possibly mild, treat URIs aggressively   Cancer (HCC)    basal cell, arms & neck   Chronic sinusitis    COPD (chronic obstructive pulmonary disease) (HCC)    COVID-19 12/06/2020   Depression    Dyspnea    Essential hypertension    Pt denies   Family history of adverse reaction to anesthesia    Mother - PONV   Fibrocystic breast disease    Fibromyalgia    GERD (gastroesophageal reflux disease)    H/O multiple allergies 10/10/2013   Hemorrhoids    HFrEF (heart failure with reduced ejection fraction) (HCC)    a. 06/2011 cMRI: EF 34%, no LGE; b. 01/2016 Echo: EF 35-40%; c. 07/2016 Echo: EF 40-45%; d. 02/2020 Echo: EF 20-25%, glob HK. GrI DD. Mildly red RV fxn. Mildly dil LA. Triv MR.   History of diverticulitis of colon    History of pneumonia 2012   Insomnia    Irritable bowel syndrome    Laryngeal nodule    Dr. Thelma Comp   Migraine without aura, without mention of intractable migraine without mention of status migrainosus    Mitral regurgitation    Monoclonal gammopathy    Neuropathy    feet and hands   Nonischemic cardiomyopathy (HCC)    a. 2012 s/p single lead ICD; b. 06/2011 cMRI: EF 34%, no LGE; b. 01/2016 Echo: EF 35-40%; c. 07/2016 Echo: EF 40-45%; d. 10/2018 Cath: nl cors. EF 20-25%; e. 10/2018 s/p BiV MDT ICD upgrade; f. 02/2020 Echo: EF 20-25%, glob HK. GrI DD.   Osteoarthritis    knees, back, hands, low back,    Osteoporosis 01/2013   T-2.7 spine (01/2016)   PONV (postoperative nausea and vomiting)    sigmoid diverticulitis with perforation, abscess and fistula 09/30/2013   Sleep apnea 2010   borderline, inconclusive- /w [place]      Past Surgical History:  Procedure Laterality Date   ABDOMINAL HYSTERECTOMY  1987   w/BSO   ANAL RECTAL MANOMETRY N/A 02/02/2016   Procedure: ANO RECTAL MANOMETRY;  Surgeon: Napoleon Form, MD;  Location: [facility];  Service: Endoscopy;  Laterality: N/A;   APPENDECTOMY     AUGMENTATION MAMMAPLASTY     BIV UPGRADE N/A 11/04/2018   Procedure: BIV ICD  UPGRADE;  Surgeon: Marinus Maw, MD;  Location: [facility] INVASIVE CV LAB;  Service: Cardiovascular;  Laterality: N/A;   breast cystectomies     due to FCBD   BREAST MASS EXCISION  01/2010   Left-fibrocystic change w/intraductal papilloma, no malignancy   carotid US  03/2015   1-39% LICA, normal RICA   CATARACT EXTRACTION W/PHACO Right 03/08/2021   Procedure: CATARACT EXTRACTION PHACO AND INTRAOCULAR LENS PLACEMENT (IOC) RIGHT;  Surgeon: Galen Manila, MD;  Location: [facility];  Service: Ophthalmology;  Laterality: Right;  13.01  1:15.7   [place] Bunionectomy  11/04/08   Right Great Toe (Dr. Lestine Box)   COLONOSCOPY  01/2018   1 small benign polyp, diverticulosis, healthy anastomosis Sheppard Penton @ [facility])   COLOSTOMY N/A 10/02/2013   DESCENDING COLOSTOMY;  Surgeon: Wilmon Arms. Tsuei, MD   COLOSTOMY REVERSAL N/A 02/10/2014   Manus Rudd, MD   EMG/MCV  10/16/01   + mild carpal tunnel   ESOPHAGOGASTRODUODENOSCOPY  09/2014   small HH, irregular Z line, fundic gland polyps with mild gastritis Juanda Chance)   Flex laryngoscopy  06/11/06   (Juengel) nml   IMPLANTABLE CARDIOVERTER DEFIBRILLATOR IMPLANT N/A 07/14/2011   MDT single chamber ICD   KNEE ARTHROSCOPY W/ ORIF     Left; "meniscus tear"   LASIK     left eye   MANDIBLE SURGERY     "got about 6 pins in the bottom of my jaw"   NCS  11/2014   L ulnar neuropathy, mild B median nerve entrapment ([place])   OSTEOTOMY     Left foot   PARTIAL COLECTOMY N/A 10/02/2013   PARTIAL COLECTOMY;  Surgeon: Wilmon Arms. Tsuei, MD   RIGHT/LEFT HEART CATH AND CORONARY ANGIOGRAPHY N/A 10/16/2018   Procedure:  RIGHT/LEFT HEART CATH AND CORONARY ANGIOGRAPHY;  Surgeon: Iran Ouch, MD;  Location: [facility] INVASIVE CV LAB;  Service: Cardiovascular;  Laterality: N/A;   RIGHT/LEFT HEART CATH AND CORONARY ANGIOGRAPHY N/A 03/16/2022   Procedure: RIGHT/LEFT HEART CATH AND CORONARY ANGIOGRAPHY;  Surgeon: Dolores Patty, MD;  Location: [facility] INVASIVE CV LAB;  Service: Cardiovascular;  Laterality: N/A;   sinus surgery     x3 with balloon    TOE AMPUTATION     "toe beside baby toe on left foot; got gangrene from corn"   TONSILLECTOMY AND ADENOIDECTOMY       Inpatient Medications: Scheduled Meds:  aspirin  81 mg Oral Once   enoxaparin (LOVENOX) injection  40 mg Subcutaneous Q24H   ezetimibe  10 mg Oral QHS   furosemide  80 mg Intravenous BID   Living Better with Heart Failure Book   Does not apply Once   potassium chloride  40 mEq Oral Once   sodium chloride flush  3 mL Intravenous Q12H   sodium chloride flush  3 mL Intravenous Q12H   Continuous Infusions:  sodium chloride     sodium chloride     PRN Meds: sodium chloride, sodium chloride, acetaminophen, ALPRAZolam, docusate sodium, ondansetron (ZOFRAN) IV, mouth rinse, sodium chloride flush, sodium chloride flush  Allergies:    Allergies  Allergen Reactions   Chlorhexidine Other (See Comments)    blisters   Codeine Other (See Comments)    Confusion and dizziness   Cyclobenzaprine Other (See Comments)    Adverse reaction - back and throat pain, dizziness, exhaustion   Cymbalta [Duloxetine Hcl] Other (See Comments)    "body spasms and made me feel weird in my head"    Diflucan [Fluconazole] Nausea And Vomiting   Dilaudid [Hydromorphone Hcl] Other (See Comments)    Feels like she is burning internally    Influenza Vac Split [Influenza Virus Vaccine] Other (See Comments)    "allergic to concentrated eggs that are in vaccine"   Lasix [Furosemide] Other (See Comments)    Severe hypotension on lasix 40 PO (can handle 20 mg oral dosing) BP 35/22  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    Latex Nausea Only, Rash and Other (See Comments)    Pain and infection   Lipitor [Atorvastatin] Other (See Comments)    Severe  muscle cramps to generic atorvastatin.  Able to take brand lipitor.   Morphine And Related Other (See Comments)    Internally feels like she is on fire    Pravastatin Other (See Comments)    myalgias   Prevacid [Lansoprazole] Other (See Comments)    Worsened GI side effects   Prevnar [Pneumococcal 13-Val Conj Vacc] Other (See Comments)    Egg allergy Bad reaction after shot   Rosuvastatin Other (See Comments)    Was not effective controlling lipids   Simvastatin Other (See Comments)    Leg cramps   Tape Other (See Comments) and Rash    All adhesives  - Blisters, itching and burning  blistering   Farxiga [Dapagliflozin] Other (See Comments)    Unknown reaction    Metaxalone Other (See Comments)    Unknown reaction    Tramadol     Leg cramps   Antihistamines, Chlorpheniramine-Type Other (See Comments)    Alters vision   Betadine [Povidone Iodine] Rash    Per pt.    Onion Diarrhea and Other (See Comments)    GI distress, flared IBS   Other Hives and Rash    NOTE: pt is able to take cephalosporins without reaction   Penicillins Other (See Comments)    CHILDHOOD ALLEGY/REACTION: "told 50 years ago I couldn't take it; might be immune to it", able to take amoxicillin Has patient had a PCN reaction causing immediate rash, facial/tongue/throat swelling, SOB or lightheadedness with hypotension: Unknown Has patient had a PCN reaction causing severe rash involving mucus membranes or skin necrosis: UnknowN Has patient had a PCN reaction that required hospitalization: /Unknown Has patient had a PCN reaction occurring within the last 10 years: Unknown If a   Red Dye Nausea Only and Swelling   Sertraline Other (See Comments)    Overly sedating   Sulfasalazine Other (See Comments)    "makes my whole body smell like sulfa & makes me sick just  smelling it"    Social History:   Social History   Socioeconomic History   Marital status: Married    Spouse name: Not on file   Number of children: 1   Years of education: Not on file   Highest education level: Not on file  Occupational History   Occupation: Database administrator   Occupation: Technical sales engineer  Tobacco Use   Smoking status: Never   Smokeless tobacco: Never   Tobacco comments:    Lived with smokers x 23 yrs, smoked herself "for a week"  Vaping Use   Vaping Use: Never used  Substance and Sexual Activity   Alcohol use: No    Alcohol/week: 0.0 standard drinks of alcohol   Drug use: No   Sexual activity: Not Currently  Other Topics Concern   Not on file  Social History Narrative   Lives with husband Molly Maduro   1 adopted child   Social Determinants of Health   Financial Resource Strain: Medium Risk (05/24/2021)   Overall Financial Resource Strain (CARDIA)    Difficulty of Paying Living Expenses: Somewhat hard  Food Insecurity: No Food Insecurity (12/01/2019)   Hunger Vital Sign    Worried About Running Out of Food in the Last Year: Never true    Ran Out of Food in the Last Year: Never true  Transportation Needs: No Transportation Needs (02/15/2022)   PRAPARE - Administrator, Civil Service (Medical): No    Lack of Transportation (Non-Medical): No  Physical Activity:  Insufficiently Active (12/01/2019)   Exercise Vital Sign    Days of Exercise per Week: 3 days    Minutes of Exercise per Session: 30 min  Stress: Stress Concern Present (12/01/2019)   [employer] of Occupational Health - Occupational Stress Questionnaire    Feeling of Stress : Rather much  Social Connections: Not on file  Intimate Partner Violence: Not At Risk (12/01/2019)   Humiliation, Afraid, Rape, and Kick questionnaire    Fear of Current or Ex-Partner: No    Emotionally Abused: No    Physically Abused: No    Sexually Abused: No    Family  History:   The patient's family history includes Alcohol abuse in her paternal grandfather; Arthritis in her mother; Colon polyps in her father; Dementia in her mother; Emphysema in her mother; Heart disease in her paternal grandfather and paternal grandmother; Hypertension in her maternal grandfather; Lung cancer in her father; Osteoporosis in her mother; Stroke in her mother and paternal grandfather. There is no history of Colon cancer, Esophageal cancer, or Gallbladder disease.  ROS:  Please see the history of present illness.  ROS  All other ROS reviewed and negative.     Physical Exam/Data:   Vitals:   03/17/22 0600 03/17/22 0738 03/17/22 1200 03/17/22 1300  BP:  94/61 103/60 (!) 94/57  Pulse:  82 98 99  Resp:  17 (!) 21 20  Temp:  97.6 F (36.4 C)  97.6 F (36.4 C)  TempSrc:  Oral  Oral  SpO2:  96% 92% 94%  Weight: 76.1 kg     Height:        Intake/Output Summary (Last 24 hours) at 03/17/2022 1351 Last data filed at 03/17/2022 1327 Gross per 24 hour  Intake 1720 ml  Output 1300 ml  Net 420 ml   Filed Weights   03/16/22 0751 03/17/22 0600  Weight: 77.1 kg 76.1 kg   Body mass index is 27.92 kg/m.   General: Well developed, well nourished, NAD Neck: Negative for carotid bruits. No JVD Lungs:Clear to ausculation bilaterally. No wheezes, rales, or rhonchi. Breathing is unlabored. Cardiovascular: RRR with S1 S2. No murmurs, rubs, gallops, or LV heave appreciated. Abdomen: Soft, non-tender, non-distended with normoactive bowel sounds. No hepatomegaly, No rebound/guarding. No obvious abdominal masses. MSK: Strength and tone appear normal for age. 5/5 in all extremities Extremities: No edema. No clubbing or cyanosis. DP/PT pulses 2+ bilaterally Neuro: Alert and oriented. No focal deficits. No facial asymmetry. MAE spontaneously. Psych: Responds to questions appropriately with normal affect.       EKG:  The EKG was personally reviewed and demonstrates:  Atrial sensed,  ventricular paced, HR 75bpm Telemetry:  Telemetry was personally reviewed and demonstrates: AV paced with HR in the 80-90s   Relevant CV Studies:  [facility] 03/16/22:   Findings:   RA = 12 RV = 55/16 PA = 53/31 (40) PCW = 39 (v = 55) Fick cardiac output/index = 3.4/1.8 PVR = 0.3 WU  Ao sat = 99% PA sat = 53%, 56%   Assessment: 1. Normal coronary arteries with anomalous RCA 2. Severe LV dysfunction EF 15% due to NICM 3. Markedly elevated filling pressures with prominent v-waves in PCWP tracing c/w known severe MR   Plan/Discussion:   Admit for diuresis. Structural and VAD teams to see to help plan next steps. Suspect MV Clip may be best next step.    TEE 03/16/22:   1. Left ventricular ejection fraction, by estimation, is <20%. The left  ventricle has  severely decreased function. The left ventricular internal  cavity size was severely dilated.   2. Right ventricular systolic function is moderately reduced. The right  ventricular size is normal.   3. Left atrial size was severely dilated. No left atrial/left atrial  appendage thrombus was detected.   4. Mitral annulus is dilated. There is posterior leaflet tethering with  severe (4+) central MR with systolic flow reversal in pulmonary veins. The  mitral valve is abnormal. Severe mitral valve regurgitation. No evidence  of mitral stenosis.   5. The aortic valve is tricuspid. There is mild calcification of the  aortic valve. Aortic valve regurgitation is trivial. Aortic valve  sclerosis/calcification is present, without any evidence of aortic stenosis.   Echocardiogram 03/10/22:   Left ventricular ejection fraction, by estimation, is 20 to 25%. The left ventricle has severely decreased function. The left ventricle demonstrates global hypokinesis. The left ventricular internal cavity size was mildly to moderately dilated. Left ventricular diastolic parameters are consistent with Grade III diastolic dysfunction  (restrictive). 1. 2. Right ventricular systolic function is normal. The right ventricular size is normal. 3. Left atrial size was moderately dilated. The mitral valve is normal in structure. Severe mitral valve regurgitation. No evidence of mitral stenosis. 4. The aortic valve is tricuspid. There is mild calcification of the aortic valve. Aortic valve regurgitation is not visualized. No aortic stenosis is present. 5. The inferior vena cava is normal in size with greater than 50% respiratory variability, suggesting right atrial pressure of 3 mmHg.  Laboratory Data:  Chemistry Recent Labs  Lab 03/13/22 0951 03/16/22 0940 03/16/22 0949 03/16/22 1538 03/17/22 0041  NA 135   < > 139 140 135  K 3.9   < > 4.6 4.2 3.7  CL 100  --   --  100 98  CO2 24  --   --  23 23  GLUCOSE 160*  --   --  114* 109*  BUN 23  --   --  25* 28*  CREATININE 1.24*  --   --  1.18* 1.31*  CALCIUM 9.7  --   --  10.2 9.5  GFRNONAA 45*  --   --  48* 42*  ANIONGAP 11  --   --  17* 14   < > = values in this interval not displayed.    Recent Labs  Lab 03/16/22 1538  PROT 7.7  ALBUMIN 3.9  AST 23  ALT 27  ALKPHOS 92  BILITOT 0.5   Hematology Recent Labs  Lab 03/13/22 0951 03/16/22 0940 03/16/22 0943 03/16/22 0949 03/16/22 1703  WBC 9.8  --   --   --  13.4*  RBC 4.03  --   --   --  4.15  HGB 12.0   < > 11.2* 11.2* 12.7  HCT 38.1   < > 33.0* 33.0* 37.8  MCV 94.5  --   --   --  91.1  MCH 29.8  --   --   --  30.6  MCHC 31.5  --   --   --  33.6  RDW 12.8  --   --   --  12.7  PLT 246  --   --   --  275   < > = values in this interval not displayed.   Cardiac EnzymesNo results for input(s): "TROPONINI" in the last 168 hours. No results for input(s): "TROPIPOC" in the last 168 hours.  BNP Recent Labs  Lab 03/13/22 0951 03/16/22 1703  BNP 710.8*  1,021.1*    DDimer No results for input(s): "DDIMER" in the last 168 hours.  Radiology/Studies:  [place] EKG SITE RITE  Result Date: 03/17/2022 If  Site Rite image not attached, placement could not be confirmed due to current cardiac rhythm.  ECHO TEE  Result Date: 03/16/2022    TRANSESOPHOGEAL ECHO REPORT   Patient Name:   Ashley [name] Date of Exam: 03/16/2022 Medical Rec #:  [id number]           Height:       65.0 in Accession #:    [id number]          Weight:       170.0 lb Date of Birth:  1946-02-22            BSA:          1.846 m Patient Age:    76 years            BP:           107/90 mmHg Patient Gender: F                   HR:           97 bpm. Exam Location:  Inpatient Procedure: Transesophageal Echo, Cardiac Doppler, Color Doppler and 3D Echo Indications:     I34.0 Nonrheumatic mitral (valve) insufficiency  History:         Patient has prior history of Echocardiogram examinations, most                  recent 03/10/2022. CHF and Cardiomyopathy, Defibrillator,                  Arrythmias:LBBB, Signs/Symptoms:Dizziness/Lightheadedness and                  Syncope; Risk Factors:Dyslipidemia. Severe mitral                  regurgitation.  Sonographer:     Sheralyn Boatman RDCS Referring Phys:  Bevelyn Buckles BENSIMHON Diagnosing Phys: Arvilla Meres MD PROCEDURE: After discussion of the risks and benefits of a TEE, an informed consent was obtained from the patient. The transesophogeal probe was passed without difficulty through the esophogus of the patient. Imaged were obtained with the patient in a left lateral decubitus position. Sedation performed by different physician. The patient was monitored while under deep sedation. Anesthestetic sedation was provided intravenously by Anesthesiology: 250mg  of Propofol, 40mg  of Lidocaine. The patient's vital signs; including heart rate, blood pressure, and oxygen saturation; remained stable throughout the procedure. The patient developed no complications during the procedure.  IMPRESSIONS  1. Left ventricular ejection fraction, by estimation, is <20%. The left ventricle has severely decreased function. The left  ventricular internal cavity size was severely dilated.  2. Right ventricular systolic function is moderately reduced. The right ventricular size is normal.  3. Left atrial size was severely dilated. No left atrial/left atrial appendage thrombus was detected.  4. Mitral annulus is dilated. There is posterior leaflet tethering with severe (4+) central MR with systolic flow reversal in pulmonary veins. The mitral valve is abnormal. Severe mitral valve regurgitation. No evidence of mitral stenosis.  5. The aortic valve is tricuspid. There is mild calcification of the aortic valve. Aortic valve regurgitation is trivial. Aortic valve sclerosis/calcification is present, without any evidence of aortic stenosis. FINDINGS  Left Ventricle: Left ventricular ejection fraction, by estimation, is <20%. The left ventricle has severely decreased function. The left ventricular  internal cavity size was severely dilated. Right Ventricle: The right ventricular size is normal. No increase in right ventricular wall thickness. Right ventricular systolic function is moderately reduced. Left Atrium: Left atrial size was severely dilated. No left atrial/left atrial appendage thrombus was detected. Right Atrium: Right atrial size was normal in size. Pericardium: There is no evidence of pericardial effusion. Mitral Valve: Mitral annulus is dilated. There is posterior leaflet tethering with severe (4+) central MR with systolic flow reversal in pulmonary veins. The mitral valve is abnormal. Severe mitral valve regurgitation, with centrally-directed jet. No evidence of mitral valve stenosis. MV peak gradient, 9.1 mmHg. The mean mitral valve gradient is 4.0 mmHg. Tricuspid Valve: The tricuspid valve is normal in structure. Tricuspid valve regurgitation is mild. Aortic Valve: The aortic valve is tricuspid. There is mild calcification of the aortic valve. Aortic valve regurgitation is trivial. Aortic valve sclerosis/calcification is present, without  any evidence of aortic stenosis. Pulmonic Valve: The pulmonic valve was normal in structure. Pulmonic valve regurgitation is trivial. Aorta: The aortic root is normal in size and structure. IAS/Shunts: No atrial level shunt detected by color flow Doppler. Additional Comments: Spectral Doppler performed. MITRAL VALVE MV Peak grad: 9.1 mmHg MV Mean grad: 4.0 mmHg MV Vmax:      1.51 m/s MV Vmean:     88.2 cm/s MR Peak grad:    61.5 mmHg MR Mean grad:    34.0 mmHg MR Vmax:         392.00 cm/s MR Vmean:        265.0 cm/s MR PISA:         4.02 cm MR PISA Eff ROA: 38 mm MR PISA Radius:  0.80 cm Arvilla Meres MD Electronically signed by Arvilla Meres MD Signature Date/Time: 03/16/2022/12:56:02 PM    Final    CARDIAC CATHETERIZATION  Result Date: 03/16/2022 Findings: RA = 12 RV = 55/16 PA = 53/31 (40) PCW = 39 (v = 55) Fick cardiac output/index = 3.4/1.8 PVR = 0.3 WU Ao sat = 99% PA sat = 53%, 56% Assessment: 1. Normal coronary arteries with anomalous RCA 2. Severe LV dysfunction EF 15% due to NICM 3. Markedly elevated filling pressures with prominent v-waves in PCWP tracing c/w known severe MR Plan/Discussion: Admit for diuresis. Structural and VAD teams to see to help plan next steps. Suspect MV Clip may be best next step. Arvilla Meres, MD 10:15 AM   STS Risk Calculator:   Procedure Type: Isolated MVR Perioperative Outcome           Estimate % Operative Mortality      6.51% Morbidity & Mortality   21% Stroke  2.41% Renal Failure   4.1% Reoperation    4.24% Prolonged Ventilation  15.7% Deep Sternal Wound Infection           0.069% Long Hospital Stay (>14 days)           11% Short Hospital Stay (<6 days)*           16.7%   Procedure Type: Isolated MVr Perioperative Outcome           Estimate % Operative Mortality      6.37% Morbidity & Mortality   15.3% Stroke  1.93% Renal Failure   1.97% Reoperation    3.56% Prolonged Ventilation  11.1% Deep Sternal Wound Infection            0.07% Long Hospital Stay (>14 days)           9.19% Short  Hospital Stay (<6 days)*           24.9%   [facility] Cardiomyopathy Questionnaire      03/17/2022    1:35 PM  KCCQ-12  1 a. Ability to shower/bathe Quite a bit limited  1 b. Ability to walk 1 block Extremely limited  1 c. Ability to hurry/jog Other, Did not do  2. Edema feet/ankles/legs Never over the past 2 weeks  3. Limited by fatigue Several times a day  4. Limited by dyspnea Several times a day  5. Sitting up / on 3+ pillows 1-2 times a week  6. Limited enjoyment of life Extremely limited  7. Rest of life w/ symptoms Mostly dissatisfied  8 a. Participation in hobbies Severely limited  8 b. Participation in chores Severely limited  8 c. Visiting family/friends Severely limited   Assessment and Plan:   Ashley [name] is a 76 y.o. female with symptoms of severe, stage D mitral regurgitation with NYHA Class IV symptoms. I have reviewed the patient's recent echocardiogram which is notable for severe LV dysfunction with an EF at 20-25% with GIIIDD, and severe mitral regurgitation. 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 R/LHC with no CAD and markedly elevated filling pressures with prominent V-waves in PCWP tracings.    I have reviewed the natural history of mitral regurgitation with the patient. We have discussed the limitations of medical therapy and the poor prognosis associated with symptomatic mitral regurgitation. We have also reviewed potential treatment options, including palliative medical therapy, conventional surgical mitral valve repair or replacement, and percutaneous mitral valve repair with MitraClip. We discussed treatment options in the context of this patient's specific comorbid medical conditions.    The patient's predicted risk of mortality with conventional mitral  valve replacement/repair is 6.51 % / 6.37 % respectively, primarily based on severe NICM with EF at <20%, acute on chronic CHF, and chronic O2 dependency. Also with concerns regarding hx of surgical site infection with delayed wound healing, extensive surgical complications, and multiple allergies.    Will send TEE imaging to industry for full review and MD to follow with final recommendations.     Signed, Georgie Chard, NP  03/17/2022 1:51 PM

## 2022-03-17 NOTE — Progress Notes (Signed)
Pt not candidate for VAD per Dr Sharlotte Alamo. Will discontinue pending orders for workup.  Bobbye Morton RN, BSN VAD Coordinator 24/7 Pager [phone]

## 2022-03-17 NOTE — Plan of Care (Signed)
  Problem: Education: Goal: Understanding of CV disease, CV risk reduction, and recovery process will improve Outcome: Progressing   Problem: Activity: Goal: Ability to return to baseline activity level will improve Outcome: Progressing   

## 2022-03-17 NOTE — Progress Notes (Signed)
CCMD called and stated pt had 8 runs of Vtach and multiple small runs of Vtach. EKG done twice and Provider notified.Provider stated she would take a look at the patients EKG strips. No new orders.

## 2022-03-18 ENCOUNTER — Other Ambulatory Visit: Payer: Self-pay

## 2022-03-18 LAB — BASIC METABOLIC PANEL
Anion gap: 14 (ref 5–15)
BUN: 27 mg/dL — ABNORMAL HIGH (ref 8–23)
CO2: 23 mmol/L (ref 22–32)
Calcium: 9.9 mg/dL (ref 8.9–10.3)
Chloride: 101 mmol/L (ref 98–111)
Creatinine, Ser: 1.29 mg/dL — ABNORMAL HIGH (ref 0.44–1.00)
GFR, Estimated: 43 mL/min — ABNORMAL LOW (ref >60)
Glucose, Bld: 114 mg/dL — ABNORMAL HIGH (ref 70–99)
Potassium: 4 mmol/L (ref 3.5–5.1)
Sodium: 138 mmol/L (ref 135–145)

## 2022-03-18 LAB — LUPUS ANTICOAGULANT PANEL
DRVVT: 37.1 s (ref 0.0–47.0)
PTT Lupus Anticoagulant: 30.9 s (ref 0.0–43.5)

## 2022-03-18 LAB — HEPATITIS B SURFACE ANTIBODY, QUANTITATIVE: Hep B S AB Quant (Post): <3.1 m[IU]/mL — ABNORMAL LOW (ref >9.9)

## 2022-03-18 NOTE — Progress Notes (Signed)
Patient ID: Ashley [name], female   DOB: 04-02-1946, 76 y.o.   MRN: [id number]  This NP visited patient at bedside.  [name] tells me she is not proceeding with LVAD and declines any further conversation with PMT.  Will sign off at this time.  Wadie Lessen NP  No charge

## 2022-03-18 NOTE — Plan of Care (Signed)
  Problem: Education: Goal: Understanding of CV disease, CV risk reduction, and recovery process will improve Outcome: Progressing Goal: Individualized Educational Video(s) Outcome: Progressing   Problem: Activity: Goal: Ability to return to baseline activity level will improve Outcome: Progressing   Problem: Cardiovascular: Goal: Ability to achieve and maintain adequate cardiovascular perfusion will improve Outcome: Progressing Goal: Vascular access site(s) Level 0-1 will be maintained Outcome: Progressing   Problem: Health Behavior/Discharge Planning: Goal: Ability to safely manage health-related needs after discharge will improve Outcome: Progressing   Problem: Education: Goal: Knowledge of General Education information will improve Description: Including pain rating scale, medication(s)/side effects and non-pharmacologic comfort measures Outcome: Progressing   Problem: Health Behavior/Discharge Planning: Goal: Ability to manage health-related needs will improve Outcome: Progressing   Problem: Clinical Measurements: Goal: Ability to maintain clinical measurements within normal limits will improve Outcome: Progressing Goal: Will remain free from infection Outcome: Progressing Goal: Diagnostic test results will improve Outcome: Progressing Goal: Respiratory complications will improve Outcome: Progressing Goal: Cardiovascular complication will be avoided Outcome: Progressing   Problem: Activity: Goal: Risk for activity intolerance will decrease Outcome: Progressing   Problem: Nutrition: Goal: Adequate nutrition will be maintained Outcome: Progressing   Problem: Coping: Goal: Level of anxiety will decrease Outcome: Progressing   Problem: Elimination: Goal: Will not experience complications related to bowel motility Outcome: Progressing Goal: Will not experience complications related to urinary retention Outcome: Progressing   Problem: Pain Managment: Goal:  General experience of comfort will improve Outcome: Progressing   Problem: Safety: Goal: Ability to remain free from injury will improve Outcome: Progressing   Problem: Skin Integrity: Goal: Risk for impaired skin integrity will decrease Outcome: Progressing   Problem: Education: Goal: Ability to demonstrate management of disease process will improve Outcome: Progressing Goal: Ability to verbalize understanding of medication therapies will improve Outcome: Progressing Goal: Individualized Educational Video(s) Outcome: Progressing   Problem: Activity: Goal: Capacity to carry out activities will improve Outcome: Progressing   Problem: Cardiac: Goal: Ability to achieve and maintain adequate cardiopulmonary perfusion will improve Outcome: Progressing

## 2022-03-18 NOTE — Progress Notes (Signed)
Patient ID: Ashley [name], female   DOB: 25-May-1945, 76 y.o.   MRN: [id number]     Advanced Heart Failure Rounding Note  PCP-Cardiologist: [name], MD   Subjective:    11/2: RHC and TEE with EF 15% severe MR and volume overload with low output. Admitted for VAD vs mTEER evals. RA = 12 RV = 55/16 PA = 53/31 (40) PCW = 39 (v = 55) Fick cardiac output/index = 3.4/1.8 PVR = 0.3 WU  Ao sat = 99% PA sat = 53%, 56% 1. Normal coronary arteries with anomalous RCA 2. Severe LV dysfunction EF 15% due to NICM 3. Markedly elevated filling pressures with prominent v-waves in PCWP tracing c/w known severe MR   Very anxious.  Multiple complaints centering around bowels and globus sensation in throat.  Wants to make sure Lasix gets pushed slowly.  Worried about blood pressure but denies lightheadedness.  SBP 80s-100s.    She does not want LVAD, considering for mTEER.   Objective:   Weight Range: 75.3 kg Body mass index is 27.64 kg/m.   Vital Signs:   Temp:  [97.6 F (36.4 C)-98 F (36.7 C)] 97.6 F (36.4 C) (11/04 0759) Pulse Rate:  [86-99] 95 (11/04 0759) Resp:  [14-21] 14 (11/04 0759) BP: (86-103)/(51-66) 93/64 (11/04 0900) SpO2:  [91 %-95 %] 95 % (11/04 0759) Weight:  [75.3 kg] 75.3 kg (11/04 0500) Last BM Date : 03/18/22  Weight change: Filed Weights   03/16/22 0751 03/17/22 0600 03/18/22 0500  Weight: 77.1 kg 76.1 kg 75.3 kg    Intake/Output:   Intake/Output Summary (Last 24 hours) at 03/18/2022 1008 Last data filed at 03/18/2022 0802 Gross per 24 hour  Intake 476 ml  Output 2000 ml  Net -1524 ml     Physical Exam    General: NAD Neck: JVP difficult, ?10, no thyromegaly or thyroid nodule.  Lungs: Clear to auscultation bilaterally with normal respiratory effort. CV: Nondisplaced PMI.  Heart regular S1/S2, no S3/S4, 2/6 HSM apex.  No peripheral edema.   Abdomen: Soft, nontender, no hepatosplenomegaly, no distention.  Skin: Intact without lesions or rashes.   Neurologic: Alert and oriented x 3.  Psych: Normal affect. Extremities: No clubbing or cyanosis.  HEENT: Normal.    Telemetry   NSR with BiV pacing (Personally reviewed)    EKG    No new EKG to review  Labs    CBC Recent Labs    03/16/22 0949 03/16/22 1703  WBC  --  13.4*  HGB 11.2* 12.7  HCT 33.0* 37.8  MCV  --  91.1  PLT  --  425   Basic Metabolic Panel Recent Labs    03/17/22 0041 03/17/22 1519 03/18/22 0028  NA 135 135 138  K 3.7 4.8 4.0  CL 98 98 101  CO2 '23 25 23  '$ GLUCOSE 109* 98 114*  BUN 28* 29* 27*  CREATININE 1.31* 1.37* 1.29*  CALCIUM 9.5 9.9 9.9  MG 2.0 2.1  --    Liver Function Tests Recent Labs    03/16/22 1538  AST 23  ALT 27  ALKPHOS 92  BILITOT 0.5  PROT 7.7  ALBUMIN 3.9   No results for input(s): "LIPASE", "AMYLASE" in the last 72 hours. Cardiac Enzymes No results for input(s): "CKTOTAL", "CKMB", "CKMBINDEX", "TROPONINI" in the last 72 hours.  BNP: BNP (last 3 results) Recent Labs    03/09/22 1601 03/13/22 0951 03/16/22 1703  BNP 1,027.8* 710.8* 1,021.1*    ProBNP (last 3 results) No  results for input(s): "PROBNP" in the last 8760 hours.   D-Dimer No results for input(s): "DDIMER" in the last 72 hours. Hemoglobin A1C Recent Labs    03/17/22 0041  HGBA1C 5.5   Fasting Lipid Panel Recent Labs    03/17/22 0041  CHOL 205*  HDL 47  LDLCALC 133*  TRIG 127  CHOLHDL 4.4   Thyroid Function Tests Recent Labs    03/17/22 0041  TSH 3.130    Other results:   Imaging    [place] EKG SITE RITE  Result Date: 03/17/2022 If Site Rite image not attached, placement could not be confirmed due to current cardiac rhythm.    Medications:     Scheduled Medications:  aspirin  81 mg Oral Once   enoxaparin (LOVENOX) injection  40 mg Subcutaneous Q24H   ezetimibe  10 mg Oral QHS   feeding supplement  237 mL Oral BID BM   furosemide  80 mg Intravenous BID   Living Better with Heart Failure Book   Does not apply Once    sodium chloride  1 spray Each Nare BID   sodium chloride flush  3 mL Intravenous Q12H   sodium chloride flush  3 mL Intravenous Q12H   spironolactone  12.5 mg Oral Daily    Infusions:  sodium chloride     sodium chloride      PRN Medications: sodium chloride, sodium chloride, acetaminophen, ALPRAZolam, benzonatate, docusate sodium, levalbuterol, menthol-cetylpyridinium, Muscle Rub, ondansetron (ZOFRAN) IV, mouth rinse, sodium chloride flush, sodium chloride flush    Patient Profile   Ashley [name] is a 76 y.o.with a history of morbid obesity, HTN, COPD, GERD , fibromyaligia, 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 Presented for [facility], admitted for diuresis and plan to meet with structural and VAD team for next steps.   Assessment/Plan   1. Chronic Systolic Heart Failure - h/o NICM dating back to 2012 with fluctuating EF. EF improved to 60% in 19' then dropped again   - [facility] 6/20 with normal coronaries and mildly elevated filling pressures.  - Underwent CRT-D upgrade 6/20 but EF has not improved - Echo 6/20 EF 20%, Echo 10/21 EF 25%,  8/22 EF 30%, RV normal, 5/23 EF 25-30%. 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  - L/RHC today: Normal coronaries with anomalous RCA, Severe LV dysfunction EF 15% due to NICM, Markedly elevated filling pressures with prominent v-waves in PCWP tracing c/w known severe MR - NYHA IIIb-IV.  - Volume status elevated on cath, JVP ~10 today. Exam difficult for volume but suspect she still has some volume overload.  She has been resistant to IV Lasix, wants it pushed slowly, etc. Getting adequate diuretic in her is going to be a challenge.  Would continue Lasix 80 mg IV bid today, may be able to return to po tomorrow.  - No BP room for GDMT adjustment.  - Continue spironolactone 12.5 mg daily - Did not tolerate [place]  - She does not want an LVAD and think  she would not be a good candidate for this.    2. MR -Severe on ECHO  -TEE today: Mitral valve: Dilated annulus. Restricted P2. Severe central MR with several jets over P2 and P1. PISA 0.8cm. No stenosis. + systolic flow reversal in PVs.  -Functional MR, suspect she would be Mitraclip candidate.  Dr. Burt Knack seeing this morning.   3. Hyperlipidemia - LDL 11/3  133, goal <50  - Continue Zetia, intolerant of statins  4. NSVT - No further.    5. Dysphagia - Speech saw her, no mechanical problems.   Would diurese today, discuss mTEER with Dr. Burt Knack.  Possibly home tomorrow.   Length of Stay: 2  Loralie Champagne  03/18/2022, 10:08 AM  Advanced Heart Failure Team Pager [phone] (M-F; 7a - 5p)  Please contact [facility] for night-coverage after hours (5p -7a ) and weekends on [email or web address]

## 2022-03-18 NOTE — Progress Notes (Signed)
Mobility Specialist Progress Note    03/18/22 1641  Mobility  Activity Ambulated with assistance in hallway  Level of Assistance Standby assist, set-up cues, supervision of patient - no hands on  Assistive Device None  Distance Ambulated (ft) 80 ft  Activity Response Tolerated well  Mobility Referral Yes  $Mobility charge 1 Mobility   Pre-Mobility: 100 HR, 96/62 (73) BP, 94% SpO2 During Mobility: 119 HR, 95% SpO2 Post-Mobility: 116 HR, 108/62 (75) BP, 92% SpO2  Pt received in bed and agreeable. C/o fatigue. On 2LO2. Returned to sitting EOB w/ husband present.   Hildred Alamin Mobility Specialist  Secure Chat Only

## 2022-03-19 ENCOUNTER — Encounter (HOSPITAL_COMMUNITY): Payer: Self-pay | Admitting: Internal Medicine

## 2022-03-19 LAB — BASIC METABOLIC PANEL
Anion gap: 22 — ABNORMAL HIGH (ref 5–15)
BUN: 40 mg/dL — ABNORMAL HIGH (ref 8–23)
CO2: 22 mmol/L (ref 22–32)
Calcium: 10.5 mg/dL — ABNORMAL HIGH (ref 8.9–10.3)
Chloride: 94 mmol/L — ABNORMAL LOW (ref 98–111)
Creatinine, Ser: 1.42 mg/dL — ABNORMAL HIGH (ref 0.44–1.00)
GFR, Estimated: 38 mL/min — ABNORMAL LOW (ref >60)
Glucose, Bld: 129 mg/dL — ABNORMAL HIGH (ref 70–99)
Potassium: 3.8 mmol/L (ref 3.5–5.1)
Sodium: 138 mmol/L (ref 135–145)

## 2022-03-19 MED ORDER — POTASSIUM CHLORIDE ER 20 MEQ PO TBCR: 20.0000 meq | EXTENDED_RELEASE_TABLET | Freq: Every day | ORAL | 3 refills | Status: AC

## 2022-03-19 MED ORDER — POTASSIUM CHLORIDE CRYS ER 20 MEQ PO TBCR
20.0000 meq | EXTENDED_RELEASE_TABLET | Freq: Every day | ORAL | Status: DC
  Filled 2022-03-19: qty 1

## 2022-03-19 MED ORDER — SPIRONOLACTONE 25 MG PO TABS: 12.5000 mg | ORAL_TABLET | Freq: Every day | ORAL | 1 refills | Status: DC

## 2022-03-19 MED ORDER — TORSEMIDE 20 MG PO TABS
20.0000 mg | ORAL_TABLET | Freq: Every day | ORAL | Status: DC
  Filled 2022-03-19: qty 1

## 2022-03-19 MED ORDER — TORSEMIDE 20 MG PO TABS: 20.0000 mg | ORAL_TABLET | Freq: Every day | ORAL | 3 refills | Status: AC

## 2022-03-19 NOTE — Discharge Summary (Signed)
Discharge Summary    Patient ID: Ashley [name] MRN: [id number]; DOB: 1946-01-04  Admit date: 03/16/2022 Discharge date: 03/19/2022  PCP:  [name], [place] Providers Cardiologist:  [name], MD  Electrophysiologist:  Cristopher Peru, MD  {   Discharge Diagnoses    Principal Problem:   Acute on chronic systolic CHF (congestive heart failure) [facility]) Active Problems:   Nonrheumatic mitral (valve) insufficiency   Acute on chronic systolic (congestive) heart failure (HCC)   Nonsustained VT   Dysphagia   Hyperlipidemia  Diagnostic Studies/Procedures    RIGHT/LEFT HEART CATH AND CORONARY ANGIOGRAPHY  03/16/2022   Conclusion  Findings:   RA = 12 RV = 55/16 PA = 53/31 (40) PCW = 39 (v = 55) Fick cardiac output/index = 3.4/1.8 PVR = 0.3 WU  Ao sat = 99% PA sat = 53%, 56%   Assessment: 1. Normal coronary arteries with anomalous RCA 2. Severe LV dysfunction EF 15% due to NICM 3. Markedly elevated filling pressures with prominent v-waves in PCWP tracing c/w known severe MR   Plan/Discussion:   Admit for diuresis. Structural and VAD teams to see to help plan next steps. Suspect MV Clip may be best next step.     TEE 03/16/2022  1. Left ventricular ejection fraction, by estimation, is <20%. The left  ventricle has severely decreased function. The left ventricular internal  cavity size was severely dilated.   2. Right ventricular systolic function is moderately reduced. The right  ventricular size is normal.   3. Left atrial size was severely dilated. No left atrial/left atrial  appendage thrombus was detected.   4. Mitral annulus is dilated. There is posterior leaflet tethering with  severe (4+) central MR with systolic flow reversal in pulmonary veins. The  mitral valve is abnormal. Severe mitral valve regurgitation. No evidence  of mitral stenosis.   5. The aortic valve is tricuspid. There is mild calcification of the  aortic valve.  Aortic valve regurgitation is trivial. Aortic valve  sclerosis/calcification is present, without any evidence of aortic  stenosis.   History of Present Illness     Ashley [name] is a 76 y.o. female with of  HTN, COPD, GERD, fibromyaligia, s/p partial colon resection for diverticulitis with re-connection, MGUS, LBBB, obesity and systolic HF due to NICM s/p CRT-D admitted for IV diuresis following right & left cardiac catheterization and TEE.  Has h/o NICM dating back to 2012 when she had PNA 10/12, EF 25-35%,  9/13 EF 30-35%. EF in 62' had recovered to 60-65% later EF back down to 15-20%.    Admitted 6/20 with increased dyspnea and chest pain. Prior to admit she had been treated with levaquin + prednisone for bronchitis. Bedside ECHO showed severely reduced LVEF-->15% and severe MR. Placed on IV lasix and set up for cath. 6/20 had RHC/LHC with normal coronaries, EF 20-25%, and mildly elevated filling pressures. Underwent upgrade to CRT-D   Echo 10/21: EF 25%  Echo 8/22 EF 30%. RV normal.  Echo 5/23 EF 25-30%.   Admitted 8/23 with CAP and a/c CHF after stopping her diuretics. Diuresed with IV Bumex. Transitioned to oral torsemide.    Was seen in HF clinic 10/26 and was volume overloaded. Furoscix ordered but user error so no benefit from it. Echo 10/27 EF 20-25% Global HK, RV normal. Severe MVR. At f/u appt still SOB/fatigued but volume now stable on device interrogation. L/RHC scheduled to further access hemodynamics/coronaries. At this appt, VAD coordinators  met with patient.   Cardiac catheterization 03/16/22 showed normal coronaries, severe LV dysfunction at EF of 15% and elevated filling pressure with prominent V wave.  TEE showed severe central MR with several jets or P2 and P1.  Admitted for IV diuresis.   Hospital Course     Consultants: CTCS and structural   Acute on chronic systolic congestive heart failure secondary to nonischemic cardiomyopathy - Cardiac catheterization  with normal coronaries and severely elevated filling pressure.  Admitted for IV diuresis.  No strict INO recorded.  Only -800 cc since admit.  Weight down 5 pounds 170>>165lb. The patient was seen by CTCS and structural team.  Patient declined LVAD. -IV Lasix transition to torsemide -Not on guideline directed medical therapy due to blood pressure -Continue spironolactone -Did not tolerated Farxiga  2.  Mitral regurgitation -TEE with Dilated annulus. Restricted P2. Severe central MR with several jets over P2 and P1. PISA 0.8cm. No stenosis. + systolic flow reversal in PVs.   - Seem by Dr. Burt Knack and plan to follow up as outpatient for mitral valve clip   3. Hyperlipidemia - LDL 11/3 133, goal <50  - Continue Zetia, intolerant of statins   4. NSVT - No further.     5. Dysphagia - Speech saw her, no mechanical problems.   Did the patient have an acute coronary syndrome (MI, NSTEMI, STEMI, etc) this admission?:  No                               Did the patient have a percutaneous coronary intervention (stent / angioplasty)?:  No.       Discharge Vitals Blood pressure 105/66, pulse 92, temperature (!) 97.5 F (36.4 C), temperature source Oral, resp. rate 13, height _0  (1.651 m), weight 75 kg, SpO2 95 %.  Filed Weights   03/17/22 0600 03/18/22 0500 03/19/22 0640  Weight: 76.1 kg 75.3 kg 75 kg    Labs & Radiologic Studies    CBC Recent Labs    03/16/22 1703  WBC 13.4*  HGB 12.7  HCT 37.8  MCV 91.1  PLT 902   Basic Metabolic Panel Recent Labs    03/17/22 0041 03/17/22 1519 03/18/22 0028 03/19/22 0017  NA 135 135 138 138  K 3.7 4.8 4.0 3.8  CL 98 98 101 94*  CO2 _1 GLUCOSE 109* 98 114* 129*  BUN 28* 29* 27* 40*  CREATININE 1.31* 1.37* 1.29* 1.42*  CALCIUM 9.5 9.9 9.9 10.5*  MG 2.0 2.1  --   --    Liver Function Tests Recent Labs    03/16/22 1538  AST 23  ALT 27  ALKPHOS 92  BILITOT 0.5  PROT 7.7  ALBUMIN 3.9   Hemoglobin A1C Recent Labs     03/17/22 0041  HGBA1C 5.5   Fasting Lipid Panel Recent Labs    03/17/22 0041  CHOL 205*  HDL 47  LDLCALC 133*  TRIG 127  CHOLHDL 4.4   Thyroid Function Tests Recent Labs    03/17/22 0041  TSH 3.130   _____________  [place] EKG SITE RITE  Result Date: 03/17/2022 If Site Rite image not attached, placement could not be confirmed due to current cardiac rhythm.  ECHO TEE  Result Date: 03/16/2022    TRANSESOPHOGEAL ECHO REPORT   Patient Name:   Ashley [name] Date of Exam: 03/16/2022 Medical Rec #:  [id number]  Height:       65.0 in Accession #:    [id number]          Weight:       170.0 lb Date of Birth:  1946-01-30            BSA:          1.846 m Patient Age:    27 years            BP:           107/90 mmHg Patient Gender: F                   HR:           97 bpm. Exam Location:  Inpatient Procedure: Transesophageal Echo, Cardiac Doppler, Color Doppler and 3D Echo Indications:     I34.0 Nonrheumatic mitral (valve) insufficiency  History:         Patient has prior history of Echocardiogram examinations, most                  recent 03/10/2022. CHF and Cardiomyopathy, Defibrillator,                  Arrythmias:LBBB, Signs/Symptoms:Dizziness/Lightheadedness and                  Syncope; Risk Factors:Dyslipidemia. Severe mitral                  regurgitation.  Sonographer:     Roseanna Rainbow RDCS Referring Phys:  Shaune Pascal BENSIMHON Diagnosing Phys: Glori Bickers MD PROCEDURE: After discussion of the risks and benefits of a TEE, an informed consent was obtained from the patient. The transesophogeal probe was passed without difficulty through the esophogus of the patient. Imaged were obtained with the patient in a left lateral decubitus position. Sedation performed by different physician. The patient was monitored while under deep sedation. Anesthestetic sedation was provided intravenously by Anesthesiology: 280m of Propofol, 448mof Lidocaine. The patient's vital signs; including heart rate,  blood pressure, and oxygen saturation; remained stable throughout the procedure. The patient developed no complications during the procedure.  IMPRESSIONS  1. Left ventricular ejection fraction, by estimation, is <20%. The left ventricle has severely decreased function. The left ventricular internal cavity size was severely dilated.  2. Right ventricular systolic function is moderately reduced. The right ventricular size is normal.  3. Left atrial size was severely dilated. No left atrial/left atrial appendage thrombus was detected.  4. Mitral annulus is dilated. There is posterior leaflet tethering with severe (4+) central MR with systolic flow reversal in pulmonary veins. The mitral valve is abnormal. Severe mitral valve regurgitation. No evidence of mitral stenosis.  5. The aortic valve is tricuspid. There is mild calcification of the aortic valve. Aortic valve regurgitation is trivial. Aortic valve sclerosis/calcification is present, without any evidence of aortic stenosis. FINDINGS  Left Ventricle: Left ventricular ejection fraction, by estimation, is <20%. The left ventricle has severely decreased function. The left ventricular internal cavity size was severely dilated. Right Ventricle: The right ventricular size is normal. No increase in right ventricular wall thickness. Right ventricular systolic function is moderately reduced. Left Atrium: Left atrial size was severely dilated. No left atrial/left atrial appendage thrombus was detected. Right Atrium: Right atrial size was normal in size. Pericardium: There is no evidence of pericardial effusion. Mitral Valve: Mitral annulus is dilated. There is posterior leaflet tethering with severe (4+) central MR with systolic flow reversal in pulmonary veins.  The mitral valve is abnormal. Severe mitral valve regurgitation, with centrally-directed jet. No evidence of mitral valve stenosis. MV peak gradient, 9.1 mmHg. The mean mitral valve gradient is 4.0 mmHg. Tricuspid  Valve: The tricuspid valve is normal in structure. Tricuspid valve regurgitation is mild. Aortic Valve: The aortic valve is tricuspid. There is mild calcification of the aortic valve. Aortic valve regurgitation is trivial. Aortic valve sclerosis/calcification is present, without any evidence of aortic stenosis. Pulmonic Valve: The pulmonic valve was normal in structure. Pulmonic valve regurgitation is trivial. Aorta: The aortic root is normal in size and structure. IAS/Shunts: No atrial level shunt detected by color flow Doppler. Additional Comments: Spectral Doppler performed. MITRAL VALVE MV Peak grad: 9.1 mmHg MV Mean grad: 4.0 mmHg MV Vmax:      1.51 m/s MV Vmean:     88.2 cm/s MR Peak grad:    61.5 mmHg MR Mean grad:    34.0 mmHg MR Vmax:         392.00 cm/s MR Vmean:        265.0 cm/s MR PISA:         4.02 cm MR PISA Eff ROA: 38 mm MR PISA Radius:  0.80 cm Glori Bickers MD Electronically signed by Glori Bickers MD Signature Date/Time: 03/16/2022/12:56:02 PM    Final    CARDIAC CATHETERIZATION  Result Date: 03/16/2022 Findings: RA = 12 RV = 55/16 PA = 53/31 (40) PCW = 39 (v = 55) Fick cardiac output/index = 3.4/1.8 PVR = 0.3 WU Ao sat = 99% PA sat = 53%, 56% Assessment: 1. Normal coronary arteries with anomalous RCA 2. Severe LV dysfunction EF 15% due to NICM 3. Markedly elevated filling pressures with prominent v-waves in PCWP tracing c/w known severe MR Plan/Discussion: Admit for diuresis. Structural and VAD teams to see to help plan next steps. Suspect MV Clip may be best next step. Glori Bickers, MD 10:15 AM  ECHOCARDIOGRAM COMPLETE  Result Date: 03/10/2022    ECHOCARDIOGRAM REPORT   Patient Name:   Ashley [name] Date of Exam: 03/10/2022 Medical Rec #:  [id number]           Height:       65.0 in Accession #:    [id number]          Weight:       173.6 lb Date of Birth:  December 25, 1945            BSA:          1.863 m Patient Age:    25 years            BP:           105/70 mmHg Patient  Gender: F                   HR:           98 bpm. Exam Location:  Outpatient Procedure: 2D Echo, Color Doppler and Cardiac Doppler Indications:    congestive heart failure  History:        Patient has prior history of Echocardiogram examinations, most                 recent 09/15/2021. CHF and Cardiomyopathy, Defibrillator and                 Pacemaker, Arrythmias:Atrial Fibrillation and LBBB; Risk                 Factors:Hypertension.  Sonographer:    Ander Purpura  Leron Croak RDCS Referring Phys: [id number] DANIEL R BENSIMHON IMPRESSIONS  1. Left ventricular ejection fraction, by estimation, is 20 to 25%. The left ventricle has severely decreased function. The left ventricle demonstrates global hypokinesis. The left ventricular internal cavity size was mildly to moderately dilated. Left ventricular diastolic parameters are consistent with Grade III diastolic dysfunction (restrictive).  2. Right ventricular systolic function is normal. The right ventricular size is normal.  3. Left atrial size was moderately dilated.  4. The mitral valve is normal in structure. Severe mitral valve regurgitation. No evidence of mitral stenosis.  5. The aortic valve is tricuspid. There is mild calcification of the aortic valve. Aortic valve regurgitation is not visualized. No aortic stenosis is present.  6. The inferior vena cava is normal in size with greater than 50% respiratory variability, suggesting right atrial pressure of 3 mmHg. FINDINGS  Left Ventricle: Left ventricular ejection fraction, by estimation, is 20 to 25%. The left ventricle has severely decreased function. The left ventricle demonstrates global hypokinesis. The left ventricular internal cavity size was mildly to moderately dilated. There is no left ventricular hypertrophy. Left ventricular diastolic parameters are consistent with Grade III diastolic dysfunction (restrictive). Right Ventricle: The right ventricular size is normal. No increase in right ventricular wall thickness.  Right ventricular systolic function is normal. Left Atrium: Left atrial size was moderately dilated. Right Atrium: Right atrial size was normal in size. Pericardium: There is no evidence of pericardial effusion. Mitral Valve: The mitral valve is normal in structure. Mild mitral annular calcification. Severe mitral valve regurgitation, with centrally-directed jet. No evidence of mitral valve stenosis. Tricuspid Valve: The tricuspid valve is normal in structure. Tricuspid valve regurgitation is trivial. No evidence of tricuspid stenosis. Aortic Valve: The aortic valve is tricuspid. There is mild calcification of the aortic valve. Aortic valve regurgitation is not visualized. No aortic stenosis is present. Pulmonic Valve: The pulmonic valve was normal in structure. Pulmonic valve regurgitation is trivial. No evidence of pulmonic stenosis. Aorta: The aortic root is normal in size and structure. Venous: The inferior vena cava is normal in size with greater than 50% respiratory variability, suggesting right atrial pressure of 3 mmHg. IAS/Shunts: No atrial level shunt detected by color flow Doppler. Additional Comments: A device lead is visualized.  LEFT VENTRICLE PLAX 2D LVIDd:         5.60 cm      Diastology LVIDs:         5.00 cm      LV e' lateral: 6.42 cm/s LV PW:         1.20 cm LV IVS:        0.80 cm LVOT diam:     1.80 cm LV SV:         31 LV SV Index:   17 LVOT Area:     2.54 cm  LV Volumes (MOD) LV vol d, MOD A4C: 129.0 ml LV vol s, MOD A4C: 90.7 ml LV SV MOD A4C:     129.0 ml RIGHT VENTRICLE            IVC RV S prime:     9.57 cm/s  IVC diam: 1.80 cm TAPSE (M-mode): 1.8 cm LEFT ATRIUM             Index        RIGHT ATRIUM           Index LA diam:        4.70 cm 2.52 cm/m   RA Area:  13.80 cm LA Vol (A2C):   66.9 ml 35.92 ml/m  RA Volume:   35.10 ml  18.84 ml/m LA Vol (A4C):   97.4 ml 52.29 ml/m LA Biplane Vol: 86.1 ml 46.23 ml/m  AORTIC VALVE LVOT Vmax:   77.40 cm/s LVOT Vmean:  51.400 cm/s LVOT VTI:     0.121 m  AORTA Ao Root diam: 2.30 cm Ao Asc diam:  2.60 cm MR Peak grad:    51.8 mmHg MR Mean grad:    34.0 mmHg    SHUNTS MR Vmax:         360.00 cm/s  Systemic VTI:  0.12 m MR Vmean:        276.0 cm/s   Systemic Diam: 1.80 cm MR PISA:         3.53 cm MR PISA Eff ROA: 38 mm MR PISA Radius:  0.75 cm Glori Bickers MD Electronically signed by Glori Bickers MD Signature Date/Time: 03/10/2022/9:28:08 AM    Final    DG Chest 2 View  Result Date: 03/07/2022 CLINICAL DATA:  76 year old female with increased O2 need EXAM: CHEST - 2 VIEW COMPARISON:  01/01/2022 FINDINGS: Cardiomediastinal silhouette unchanged in size and contour. New opacity at the right lung base with blunting of the right costophrenic angle and meniscus on the frontal lateral view. New opacity at the left lung base with blunting the costophrenic angle, smaller than the right. Interlobular septal thickening present throughout. Overall the aeration of the lungs is better than the comparison chest x-ray. No pneumothorax. Unchanged left chest wall pacing device/AICD. Degenerative changes.  No displaced fracture IMPRESSION: Evidence of acute CHF with small right greater than left pleural effusion, however, the overall appearance is improved when compared to the chest x-ray 01/01/2022 Electronically Signed   By: Corrie Mckusick D.O.   On: 03/07/2022 11:56   CT Abdomen Pelvis W Contrast  Result Date: 03/07/2022 CLINICAL DATA:  Pt c/o severe abdominal pain with changes to bowels from diarrhea to constipation and seeing blood in her stools when wiping x 2 weeks. She also c/o generalized weakness with her throat closing when trying to eat and drink. NKI Hx Partial Colectomy, Appendectomy and Hysterectomy. EXAM: CT ABDOMEN AND PELVIS WITH CONTRAST TECHNIQUE: Multidetector CT imaging of the abdomen and pelvis was performed using the standard protocol following bolus administration of intravenous contrast. RADIATION DOSE REDUCTION: This exam was  performed according to the departmental dose-optimization program which includes automated exposure control, adjustment of the mA and/or kV according to patient size and/or use of iterative reconstruction technique. CONTRAST:  63m OMNIPAQUE IOHEXOL 300 MG/ML  SOLN COMPARISON:  12/02/2021. FINDINGS: Lower chest: Small bilateral pleural effusions. Dependent lung base atelectasis. No convincing pneumonia or pulmonary edema. Hepatobiliary: No focal liver abnormality is seen. No gallstones, gallbladder wall thickening, or biliary dilatation. Pancreas: Unremarkable. No pancreatic ductal dilatation or surrounding inflammatory changes. Spleen: Normal in size without focal abnormality. Adrenals/Urinary Tract: No adrenal masses. Bilateral renal cortical thinning. Symmetric renal enhancement. Small bilateral low-attenuation renal masses, largest on the right, 1.5 cm, all consistent with cysts and similar to the prior study. No renal stones. No hydronephrosis. Normal ureters. Bladder is unremarkable. Stomach/Bowel: Normal stomach. Small bowel and colon are normal in caliber. No wall thickening. No inflammation. Scattered colonic diverticula without evidence of diverticulitis. Vascular/Lymphatic: Mild aortic atherosclerosis. No aneurysm. No enlarged lymph nodes. Reproductive: Status post hysterectomy. No adnexal masses. Other: Large midline abdominal wall hernia, base measuring 10 cm transversely. Peritoneal fat, colon and small bowel herniated through the  defect, without evidence of incarceration or strangulation. No ascites. Musculoskeletal: No fracture or acute finding.  No bone lesion. IMPRESSION: 1. No acute findings within the abdomen or pelvis. 2. Large midline hernia that contains colon and small bowel without evidence of bowel obstruction, strangulation or incarceration, hernia similar to the prior CTs. 3. Mild aortic atherosclerosis. Electronically Signed   By: Lajean Manes M.D.   On: 03/07/2022 09:20   Disposition    Pt is being discharged home today in good condition.  Follow-up Plans & Appointments     Follow-up Information     Bensimhon, Shaune Pascal, MD Follow up.   Specialty: Cardiology Why: office will call with date and time of appointment Contact information: [address] [place] [address] [phone]                Discharge Instructions     Diet - low sodium heart healthy   Complete by: As directed    Increase activity slowly   Complete by: As directed         Discharge Medications   Allergies as of 03/19/2022       Reactions   Chlorhexidine Other (See Comments)   blisters   Codeine Other (See Comments)   Confusion and dizziness   Cyclobenzaprine Other (See Comments)   Adverse reaction - back and throat pain, dizziness, exhaustion   Cymbalta [duloxetine Hcl] Other (See Comments)   "body spasms and made me feel weird in my head"    Diflucan [fluconazole] Nausea And Vomiting   Dilaudid [hydromorphone Hcl] Other (See Comments)   Feels like she is burning internally    Influenza Vac Split [influenza Virus Vaccine] Other (See Comments)   "allergic to concentrated eggs that are in vaccine"   Lasix [furosemide] Other (See Comments)   Severe hypotension on lasix 40 PO (can handle 20 mg oral dosing) 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    Latex Nausea Only, Rash, Other (See Comments)   Pain and infection   Lipitor [atorvastatin] Other (See Comments)   Severe muscle cramps to generic atorvastatin.  Able to take brand lipitor.   Morphine And Related Other (See Comments)   Internally feels like she is on fire    Pravastatin Other (See Comments)   myalgias   Prevacid [lansoprazole] Other (See Comments)   Worsened GI side effects   Prevnar [pneumococcal 13-val Conj Vacc] Other (See Comments)   Egg allergy Bad reaction after shot   Rosuvastatin Other (See Comments)   Was not effective controlling lipids   Simvastatin Other (See Comments)   Leg cramps    Tape Other (See Comments), Rash   All adhesives  - Blisters, itching and burning blistering   Farxiga [dapagliflozin] Other (See Comments)   Unknown reaction    Metaxalone Other (See Comments)   Unknown reaction    Tramadol    Leg cramps   Antihistamines, Chlorpheniramine-type Other (See Comments)   Alters vision   Betadine [povidone Iodine] Rash   Per pt.    Onion Diarrhea, Other (See Comments)   GI distress, flared IBS   Other Hives, Rash   NOTE: pt is able to take cephalosporins without reaction   Penicillins Other (See Comments)   CHILDHOOD ALLEGY/REACTION: "told 50 years ago I couldn't take it; might be immune to it", able to take amoxicillin Has patient had a PCN reaction causing immediate rash, facial/tongue/throat swelling, SOB or lightheadedness with hypotension: Unknown Has patient had a PCN reaction causing severe rash  involving mucus membranes or skin necrosis: UnknowN Has patient had a PCN reaction that required hospitalization: /Unknown Has patient had a PCN reaction occurring within the last 10 years: Unknown If a   Red Dye Nausea Only, Swelling   Sertraline Other (See Comments)   Overly sedating   Sulfasalazine Other (See Comments)   "makes my whole body smell like sulfa & makes me sick just smelling it"        Medication List     STOP taking these medications    carvedilol 6.25 MG tablet Commonly known as: COREG   losartan 25 MG tablet Commonly known as: COZAAR       TAKE these medications    acetaminophen 500 MG tablet Commonly known as: TYLENOL Take 250 mg by mouth daily as needed for moderate pain or headache (PAIN).   ALPRAZolam 0.25 MG tablet Commonly known as: XANAX Take 1 tablet (0.25 mg total) by mouth 2 (two) times daily as needed for anxiety. What changed: how much to take   aspirin EC 81 MG tablet Take 81 mg by mouth at bedtime.   beclomethasone 80 MCG/ACT inhaler Commonly known as: QVAR Inhale 2 puffs into the lungs 2 (two)  times daily.   BENZONATATE PO Take 1 capsule by mouth daily as needed for cough.   BIOFREEZE ROLL-ON EX Apply 1 Application topically daily as needed (For shoulder pain).   Coenzyme Q10 200 MG capsule Take 200 mg by mouth daily.   Delsym 30 MG/5ML liquid Generic drug: dextromethorphan Take 2.5 mLs by mouth daily as needed for cough.   docusate sodium 100 MG capsule Commonly known as: COLACE Take 100 mg by mouth at bedtime as needed for mild constipation.   ezetimibe 10 MG tablet Commonly known as: ZETIA TAKE 1 TABLET BY MOUTH EVERY DAY What changed: when to take this   guaiFENesin 600 MG 12 hr tablet Commonly known as: MUCINEX Take 1 tablet (600 mg total) by mouth 2 (two) times daily as needed for to loosen phlegm.   levalbuterol 45 MCG/ACT inhaler Commonly known as: Xopenex HFA Inhale 2 puffs into the lungs every 6 (six) hours as needed for wheezing or shortness of breath.   miconazole 2 % powder Commonly known as: MICOTIN Apply 1 application  topically daily as needed (yeast infection on stomach).   nystatin 100000 UNIT/ML suspension Commonly known as: MYCOSTATIN TAKE 5 MLS (500,000 UNITS TOTAL) BY MOUTH 3 (THREE) TIMES DAILY.   nystatin cream Commonly known as: MYCOSTATIN Apply 1 application. topically as needed for dry skin (yeast infection).   OXYGEN Inhale 2 L into the lungs continuous.   polyethylene glycol 17 g packet Commonly known as: MIRALAX / GLYCOLAX Take 17 g by mouth daily as needed for mild constipation or moderate constipation.   Potassium Chloride ER 20 MEQ Tbcr Take 20 mEq by mouth daily. What changed:  medication strength how much to take when to take this   sodium chloride 0.65 % Soln nasal spray Commonly known as: OCEAN Place 1 spray into both nostrils 2 (two) times daily.   spironolactone 25 MG tablet Commonly known as: ALDACTONE Take 0.5 tablets (12.5 mg total) by mouth daily. What changed: how much to take   torsemide 20 MG  tablet Commonly known as: DEMADEX Take 1 tablet (20 mg total) by mouth daily. Take extra torsemide 10 mg with potassium as needed for weight >174 What changed:  medication strength how much to take when to take this   Tums Ultra  1000 400 MG chewable tablet Generic drug: calcium elemental as carbonate Chew 1,000 mg by mouth daily as needed for heartburn.   Vitamin D3 50 MCG (2000 UT) capsule Take 1 capsule (2,000 Units total) by mouth daily.   VITAMIN E PO Take 1 tablet by mouth daily.           Outstanding Labs/Studies   BMP  Duration of Discharge Encounter   Greater than 30 minutes including physician time.  Jarrett Soho, PA 03/19/2022, 10:34 AM

## 2022-03-19 NOTE — Progress Notes (Addendum)
Patient ID: Ashley [name], female   DOB: Jan 28, 1946, 76 y.o.   MRN: [id number]     Advanced Heart Failure Rounding Note  PCP-Cardiologist: [name], MD   Subjective:    11/2: RHC and TEE with EF 15% severe MR and volume overload with low output. Admitted for VAD vs mTEER evals. RA = 12 RV = 55/16 PA = 53/31 (40) PCW = 39 (v = 55) Fick cardiac output/index = 3.4/1.8 PVR = 0.3 WU  Ao sat = 99% PA sat = 53%, 56% 1. Normal coronary arteries with anomalous RCA 2. Severe LV dysfunction EF 15% due to NICM 3. Markedly elevated filling pressures with prominent v-waves in PCWP tracing c/w known severe MR   Seems less anxious today.  Says breathing is ok. Agreed to both IV Lasix doses yesterday, weight down 1 lb.  Creatinine mildly higher at 1.4.  SBP 80s-100s.   She does not want LVAD, considering for mTEER.   Objective:   Weight Range: 75 kg Body mass index is 27.51 kg/m.   Vital Signs:   Temp:  [97.4 F (36.3 C)-98 F (36.7 C)] 97.5 F (36.4 C) (11/05 0719) Pulse Rate:  [88-115] 92 (11/05 0719) Resp:  [13-20] 13 (11/05 0719) BP: (91-105)/(58-68) 105/66 (11/05 0719) SpO2:  [94 %-97 %] 95 % (11/05 0719) Weight:  [75 kg] 75 kg (11/05 0640) Last BM Date : 03/18/22  Weight change: Filed Weights   03/17/22 0600 03/18/22 0500 03/19/22 0640  Weight: 76.1 kg 75.3 kg 75 kg    Intake/Output:   Intake/Output Summary (Last 24 hours) at 03/19/2022 0907 Last data filed at 03/19/2022 0300 Gross per 24 hour  Intake 480 ml  Output 750 ml  Net -270 ml     Physical Exam    General: NAD Neck: No JVD, no thyromegaly or thyroid nodule.  Lungs: Clear to auscultation bilaterally with normal respiratory effort. CV: Nondisplaced PMI.  Heart regular S1/S2, no S3/S4, 1/6 HSM apex.  No peripheral edema.   Abdomen: Soft, nontender, no hepatosplenomegaly, no distention.  Skin: Intact without lesions or rashes.  Neurologic: Alert and oriented x 3.  Psych: Normal  affect. Extremities: No clubbing or cyanosis.  HEENT: Normal.     Telemetry   NSR with BiV pacing (Personally reviewed)    EKG    No new EKG to review  Labs    CBC Recent Labs    03/16/22 1703  WBC 13.4*  HGB 12.7  HCT 37.8  MCV 91.1  PLT 194   Basic Metabolic Panel Recent Labs    03/17/22 0041 03/17/22 1519 03/18/22 0028 03/19/22 0017  NA 135 135 138 138  K 3.7 4.8 4.0 3.8  CL 98 98 101 94*  CO2 '23 25 23 22  '$ GLUCOSE 109* 98 114* 129*  BUN 28* 29* 27* 40*  CREATININE 1.31* 1.37* 1.29* 1.42*  CALCIUM 9.5 9.9 9.9 10.5*  MG 2.0 2.1  --   --    Liver Function Tests Recent Labs    03/16/22 1538  AST 23  ALT 27  ALKPHOS 92  BILITOT 0.5  PROT 7.7  ALBUMIN 3.9   No results for input(s): "LIPASE", "AMYLASE" in the last 72 hours. Cardiac Enzymes No results for input(s): "CKTOTAL", "CKMB", "CKMBINDEX", "TROPONINI" in the last 72 hours.  BNP: BNP (last 3 results) Recent Labs    03/09/22 1601 03/13/22 0951 03/16/22 1703  BNP 1,027.8* 710.8* 1,021.1*    ProBNP (last 3 results) No results for input(s): "PROBNP" in  the last 8760 hours.   D-Dimer No results for input(s): "DDIMER" in the last 72 hours. Hemoglobin A1C Recent Labs    03/17/22 0041  HGBA1C 5.5   Fasting Lipid Panel Recent Labs    03/17/22 0041  CHOL 205*  HDL 47  LDLCALC 133*  TRIG 127  CHOLHDL 4.4   Thyroid Function Tests Recent Labs    03/17/22 0041  TSH 3.130    Other results:   Imaging    No results found.   Medications:     Scheduled Medications:  aspirin  81 mg Oral Once   enoxaparin (LOVENOX) injection  40 mg Subcutaneous Q24H   ezetimibe  10 mg Oral QHS   feeding supplement  237 mL Oral BID BM   Living Better with Heart Failure Book   Does not apply Once   sodium chloride  1 spray Each Nare BID   sodium chloride flush  3 mL Intravenous Q12H   sodium chloride flush  3 mL Intravenous Q12H   spironolactone  12.5 mg Oral Daily   torsemide  20 mg  Oral Daily    Infusions:  sodium chloride     sodium chloride      PRN Medications: sodium chloride, sodium chloride, acetaminophen, ALPRAZolam, benzonatate, docusate sodium, levalbuterol, menthol-cetylpyridinium, Muscle Rub, ondansetron (ZOFRAN) IV, mouth rinse, sodium chloride flush, sodium chloride flush    Patient Profile   Ashley [name] is a 76 y.o.with a history of morbid obesity, HTN, COPD, GERD , fibromyaligia, 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 Presented for [facility], admitted for diuresis and plan to meet with structural and VAD team for next steps.   Assessment/Plan   1. Chronic Systolic Heart Failure - h/o NICM dating back to 2012 with fluctuating EF. EF improved to 60% in 19' then dropped again   - [facility] 6/20 with normal coronaries and mildly elevated filling pressures.  - Underwent CRT-D upgrade 6/20 but EF has not improved - Echo 6/20 EF 20%, Echo 10/21 EF 25%,  8/22 EF 30%, RV normal, 5/23 EF 25-30%. 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  - L/RHC today: Normal coronaries with anomalous RCA, Severe LV dysfunction EF 15% due to NICM, Markedly elevated filling pressures with prominent v-waves in PCWP tracing c/w known severe MR - NYHA IIIb-IV.  - Volume status improved today, restart torsemide at 20 mg daily for home.  - No BP room for GDMT adjustment.  - Continue spironolactone 12.5 mg daily - Did not tolerate [place]  - She does not want an LVAD and think she would not be a good candidate for this.    2. MR -Severe on ECHO  -TEE today: Mitral valve: Dilated annulus. Restricted P2. Severe central MR with several jets over P2 and P1. PISA 0.8cm. No stenosis. + systolic flow reversal in PVs.  -Functional MR, plan for Mitraclip in near future.  Has been seen by Dr. Burt Knack, his office will call with details.    3. Hyperlipidemia - LDL 11/3 133, goal <50  -  Continue Zetia, intolerant of statins  4. NSVT - No further.    5. Dysphagia - Speech saw her, no mechanical problems.   OK for home today.  Will need close followup in CHF clinic and will get Mitraclip in near future.  Meds for home: spironolactone 12.5 daily, torsemide 20 daily, ASA 81 daily, Zetia 10 daily, KCl 20 daily.  Hold Coreg and losartan for now with soft BP, may be able to restart post-Mitraclip.   Length of Stay: 3  Loralie Champagne  03/19/2022, 9:07 AM  Advanced Heart Failure Team Pager [phone] (M-F; 7a - 5p)  Please contact [facility] for night-coverage after hours (5p -7a ) and weekends on [email or web address]

## 2022-03-20 ENCOUNTER — Telehealth: Payer: Self-pay

## 2022-03-20 NOTE — Telephone Encounter (Signed)
Per [employer] review of 03/16/2022 TEE, "For patient CM: This is Secondary MR and does not require a surgical consult  This valve looks suitable for a MitraClip implant. In the Bicaval and SAXB views, the fossa looks reasonable for a transseptal puncture. The LA dimensions appear just large enough for steering and straddle of the Clip Delivery System. The MR jet is broad based and centrally located. The posterior leaflet measures 7.44m in the 124 LVOT grasping view. Gradient measures 4 mmHg (87 BPM); MVA measures 5.31cm2 from the Transgastric view. Based on this information, I'd like to start with an NTW at A2/P2 and assess the gradient. It will likely need more than one clip."

## 2022-03-20 NOTE — Telephone Encounter (Signed)
Transition Care Management Follow-up Telephone Call Date of discharge and from where: Ashley [name] 03-19-22 Dx: acute on chronic systolic CHF  How have you been since you were released from the hospital? Doing ok  Any questions or concerns? No  Items Reviewed: Did the pt receive and understand the discharge instructions provided? Yes  Medications obtained and verified? Yes  Other? No  Any new allergies since your discharge? No  Dietary orders reviewed? Yes Do you have support at home? Yes   Home Care and Equipment/Supplies: Were home health services ordered? no If so, what is the name of the agency? na  Has the agency set up a time to come to the patient's home? not applicable Were any new equipment or medical supplies ordered?  No What is the name of the medical supply agency? na Were you able to get the supplies/equipment? not applicable Do you have any questions related to the use of the equipment or supplies? No  Functional Questionnaire: (I = Independent and D = Dependent) ADLs: I  Bathing/Dressing- I  Meal Prep- I  Eating- I  Maintaining continence- I  Transferring/Ambulation- I  Managing Meds- I  Follow up appointments reviewed:  PCP Hospital f/u appt confirmed? Yes  Scheduled to see Dr Danise Mina on 03-24-22 @ 1130am. [facility] f/u appt confirmed? No  - waiting to hear from Dr York Cerise office . Are transportation arrangements needed? No  If their condition worsens, is the pt aware to call PCP or go to the Emergency Dept.? Yes Was the patient provided with contact information for the PCP's office or ED? Yes Was to pt encouraged to call back with questions or concerns? Yes   Juanda Crumble LPN [place] Direct Dial [phone]

## 2022-03-20 NOTE — Telephone Encounter (Signed)
Refill request Alprazolam Last refill 03/06/22 #20 Last office visit 03/06/22

## 2022-03-21 ENCOUNTER — Ambulatory Visit: Payer: Medicare Other | Admitting: Internal Medicine

## 2022-03-21 ENCOUNTER — Telehealth: Payer: Self-pay

## 2022-03-21 NOTE — Telephone Encounter (Signed)
ERx 

## 2022-03-21 NOTE — Telephone Encounter (Signed)
Ashley [name] imaging was reviewed at the Valve Team meeting today and it was decided to proceed with Clip.  Spoke with the patient at length. She will proceed with MitraClip implant on 03/29/2022. She is scheduled for appointment with Dr. [name] 11/13.  Will attempt same day PAT testing on 11/16.  The patient has several concerns. Her allergy list is 28 items long. She can only use alcohol for cleaning (no hibiclens, no betadine). She can only use paper tape and tegaderm.  She requests to use all her medications during her hospital stay. She understands there is a protocol for checking home medications that must be followed. She states she refuses to take any medications (that are not new) other than her home medications because it took her years to find a regimen that nothing (additives/dye/binders/etc) react negatively with her.  Will discuss with Pharmacy and PAT for planning and confirm with the patient tomorrow.  She was grateful for call and agreed with plan.

## 2022-03-23 ENCOUNTER — Other Ambulatory Visit: Payer: Self-pay

## 2022-03-23 DIAGNOSIS — I340 Nonrheumatic mitral (valve) insufficiency: Secondary | ICD-10-CM

## 2022-03-23 LAB — FACTOR 5 LEIDEN

## 2022-03-23 NOTE — Telephone Encounter (Signed)
Spoke with Jovita Kussmaul, RN, and received permission for the patient to have same day PAT on 04/10/2022.   Confirmed with the patient I will meet her at her visit at [facility] on Monday, 11/13 to review instructions for her procedure on 11/16.  She understands Pharmacy has been contacted and a plan is being made for her medications when she is here. She was grateful for call and agreed with plan.

## 2022-03-24 ENCOUNTER — Ambulatory Visit (INDEPENDENT_AMBULATORY_CARE_PROVIDER_SITE_OTHER): Payer: Medicare Other | Admitting: Family Medicine

## 2022-03-24 ENCOUNTER — Encounter: Payer: Self-pay | Admitting: Family Medicine

## 2022-03-24 VITALS — BP 110/64 | HR 100 | Temp 97.5°F | Ht 65.0 in | Wt 170.2 lb

## 2022-03-24 DIAGNOSIS — J9601 Acute respiratory failure with hypoxia: Secondary | ICD-10-CM | POA: Diagnosis not present

## 2022-03-24 DIAGNOSIS — G4700 Insomnia, unspecified: Secondary | ICD-10-CM | POA: Diagnosis not present

## 2022-03-24 DIAGNOSIS — I5043 Acute on chronic combined systolic (congestive) and diastolic (congestive) heart failure: Secondary | ICD-10-CM

## 2022-03-24 DIAGNOSIS — R5381 Other malaise: Secondary | ICD-10-CM

## 2022-03-24 DIAGNOSIS — R5382 Chronic fatigue, unspecified: Secondary | ICD-10-CM

## 2022-03-24 DIAGNOSIS — F411 Generalized anxiety disorder: Secondary | ICD-10-CM

## 2022-03-24 DIAGNOSIS — N1832 Chronic kidney disease, stage 3b: Secondary | ICD-10-CM

## 2022-03-24 DIAGNOSIS — I340 Nonrheumatic mitral (valve) insufficiency: Secondary | ICD-10-CM | POA: Diagnosis not present

## 2022-03-24 MED ORDER — ZOLPIDEM TARTRATE 5 MG PO TABS: 2.5000 mg | ORAL_TABLET | Freq: Every evening | ORAL | 0 refills | Status: AC | PRN

## 2022-03-24 NOTE — Patient Instructions (Addendum)
I hope for a speedy recovery with upcoming procedure!  Return in 1 month for wellness visit.

## 2022-03-24 NOTE — Progress Notes (Unsigned)
Patient ID: Ashley [name], female    DOB: 1945/07/26, 76 y.o.   MRN: [id number]  This visit was conducted in person.  BP 110/64   Pulse 100   Temp (!) 97.5 F (36.4 C) (Temporal)   Ht '5\' 5"'$  (1.651 m)   Wt 170 lb 3.2 oz (77.2 kg)   SpO2 96% Comment: 2 L O2, pulsating  BMI 28.32 kg/m    CC: hosp f/u visit  Subjective:   HPI: Ashley [name] is a 76 y.o. female presenting on 03/24/2022 for Hospitalization Follow-up (Admitted on 03/16/22 at [facility] ED, dx chronic combined systolic and diastolic CHF.  Pt accompanied by husband, Robert. )   Recent hospitalization for acute exacerbation of chronic HFrEF with chronic respiratory failure - with increased oxygen use. No improvement in outpatient management (torsemide increased from QOD to BID x3d) followed by trial of subQ lasix. Hospitalized through advanced CHF clinic for IV bumex diuresis, updated echo showed EF 20% with severe MR, R/L heart catheterization showed normal coronary anatomy. Planned MitraClip next week.   During hospitalization had reassuring speech evaluation for dysphagia - no mechanical problem. She notes dysphagia has improved with IV diuresis during hospitalization.   By the way - having difficulty falling asleep due to recent stressors. Requests ambien Rx, has tolerated well previously.      Relevant past medical, surgical, family and social history reviewed and updated as indicated. Interim medical history since our last visit reviewed. Allergies and medications reviewed and updated. Outpatient Medications Prior to Visit  Medication Sig Dispense Refill   acetaminophen (TYLENOL) 500 MG tablet Take 250 mg by mouth daily as needed for moderate pain or headache (PAIN).     ALPRAZolam (XANAX) 0.25 MG tablet TAKE 1 TABLET BY MOUTH 2 TIMES DAILY AS NEEDED FOR ANXIETY. 20 tablet 0   aspirin EC 81 MG tablet Take 81 mg by mouth at bedtime.     beclomethasone (QVAR) 80 MCG/ACT inhaler Inhale 2 puffs into the lungs 2 (two)  times daily. 1 each 5   BENZONATATE PO Take 1 capsule by mouth daily as needed for cough.     calcium elemental as carbonate (TUMS ULTRA 1000) 400 MG chewable tablet Chew 1,000 mg by mouth daily as needed for heartburn.     Cholecalciferol (VITAMIN D3) 50 MCG (2000 UT) capsule Take 1 capsule (2,000 Units total) by mouth daily.     Coenzyme Q10 200 MG capsule Take 200 mg by mouth daily.     dextromethorphan (DELSYM) 30 MG/5ML liquid Take 2.5 mLs by mouth daily as needed for cough.     docusate sodium (COLACE) 100 MG capsule Take 100 mg by mouth at bedtime as needed for mild constipation.     ezetimibe (ZETIA) 10 MG tablet TAKE 1 TABLET BY MOUTH EVERY DAY (Patient taking differently: Take 10 mg by mouth at bedtime.) 90 tablet 3   guaiFENesin (MUCINEX) 600 MG 12 hr tablet Take 1 tablet (600 mg total) by mouth 2 (two) times daily as needed for to loosen phlegm.     levalbuterol (XOPENEX HFA) 45 MCG/ACT inhaler Inhale 2 puffs into the lungs every 6 (six) hours as needed for wheezing or shortness of breath. 15 g 3   Menthol, Topical Analgesic, (BIOFREEZE ROLL-ON EX) Apply 1 Application topically daily as needed (For shoulder pain).     miconazole (MICOTIN) 2 % powder Apply 1 application  topically daily as needed (yeast infection on stomach).     nystatin (MYCOSTATIN) 100000  UNIT/ML suspension TAKE 5 MLS (500,000 UNITS TOTAL) BY MOUTH 3 (THREE) TIMES DAILY. 120 mL 0   nystatin cream (MYCOSTATIN) Apply 1 application. topically as needed for dry skin (yeast infection). 30 g 3   OXYGEN Inhale 2 L into the lungs continuous.     polyethylene glycol (MIRALAX / GLYCOLAX) 17 g packet Take 17 g by mouth daily as needed for mild constipation or moderate constipation.     potassium chloride 20 MEQ TBCR Take 20 mEq by mouth daily. 90 tablet 3   sodium chloride (OCEAN) 0.65 % SOLN nasal spray Place 1 spray into both nostrils 2 (two) times daily.     spironolactone (ALDACTONE) 25 MG tablet Take 0.5 tablets (12.5 mg  total) by mouth daily. 90 tablet 1   torsemide (DEMADEX) 20 MG tablet Take 1 tablet (20 mg total) by mouth daily. Take extra torsemide 10 mg with potassium as needed for weight >174 90 tablet 3   VITAMIN E PO Take 1 tablet by mouth daily.     No facility-administered medications prior to visit.     Per HPI unless specifically indicated in ROS section below Review of Systems  Objective:  BP 110/64   Pulse 100   Temp (!) 97.5 F (36.4 C) (Temporal)   Ht '5\' 5"'$  (1.651 m)   Wt 170 lb 3.2 oz (77.2 kg)   SpO2 96% Comment: 2 L O2, pulsating  BMI 28.32 kg/m   Wt Readings from Last 3 Encounters:  03/24/22 170 lb 3.2 oz (77.2 kg)  03/19/22 165 lb 5.5 oz (75 kg)  03/13/22 172 lb 6.4 oz (78.2 kg)      Physical Exam Vitals and nursing note reviewed.  Constitutional:      Appearance: Normal appearance. She is not ill-appearing.  HENT:     Mouth/Throat:     Mouth: Mucous membranes are moist.     Pharynx: Oropharynx is clear. No oropharyngeal exudate or posterior oropharyngeal erythema.  Eyes:     Extraocular Movements: Extraocular movements intact.     Pupils: Pupils are equal, round, and reactive to light.  Cardiovascular:     Rate and Rhythm: Normal rate and regular rhythm.     Pulses: Normal pulses.     Heart sounds: Murmur (faint systolic) heard.  Pulmonary:     Effort: Pulmonary effort is normal. No respiratory distress.     Breath sounds: Normal breath sounds. No wheezing, rhonchi or rales.  Musculoskeletal:     Right lower leg: No edema.     Left lower leg: No edema.  Skin:    General: Skin is warm and dry.     Findings: No rash.  Neurological:     Mental Status: She is alert.  Psychiatric:        Mood and Affect: Mood normal.        Behavior: Behavior normal.       Results for orders placed or performed during the hospital encounter of 03/16/22  Urinalysis, Routine w reflex microscopic Urine, Clean Catch  Result Value Ref Range   Color, Urine STRAW (A) YELLOW    APPearance CLEAR CLEAR   Specific Gravity, Urine 1.010 1.005 - 1.030   pH 5.0 5.0 - 8.0   Glucose, UA NEGATIVE NEGATIVE mg/dL   Hgb urine dipstick NEGATIVE NEGATIVE   Bilirubin Urine NEGATIVE NEGATIVE   Ketones, ur NEGATIVE NEGATIVE mg/dL   Protein, ur NEGATIVE NEGATIVE mg/dL   Nitrite NEGATIVE NEGATIVE   Leukocytes,Ua NEGATIVE NEGATIVE  Comprehensive metabolic  panel  Result Value Ref Range   Sodium 140 135 - 145 mmol/L   Potassium 4.2 3.5 - 5.1 mmol/L   Chloride 100 98 - 111 mmol/L   CO2 23 22 - 32 mmol/L   Glucose, Bld 114 (H) 70 - 99 mg/dL   BUN 25 (H) 8 - 23 mg/dL   Creatinine, Ser 1.18 (H) 0.44 - 1.00 mg/dL   Calcium 10.2 8.9 - 10.3 mg/dL   Total Protein 7.7 6.5 - 8.1 g/dL   Albumin 3.9 3.5 - 5.0 g/dL   AST 23 15 - 41 U/L   ALT 27 0 - 44 U/L   Alkaline Phosphatase 92 38 - 126 U/L   Total Bilirubin 0.5 0.3 - 1.2 mg/dL   GFR, Estimated 48 (L) >60 mL/min   Anion gap 17 (H) 5 - 15  CBC  Result Value Ref Range   WBC 13.4 (H) 4.0 - 10.5 K/uL   RBC 4.15 3.87 - 5.11 MIL/uL   Hemoglobin 12.7 12.0 - 15.0 g/dL   HCT 37.8 36.0 - 46.0 %   MCV 91.1 80.0 - 100.0 fL   MCH 30.6 26.0 - 34.0 pg   MCHC 33.6 30.0 - 36.0 g/dL   RDW 12.7 11.5 - 15.5 %   Platelets 275 150 - 400 K/uL   nRBC 0.0 0.0 - 0.2 %  Brain natriuretic peptide  Result Value Ref Range   B Natriuretic Peptide 1,021.1 (H) 0.0 - 100.0 pg/mL  Magnesium  Result Value Ref Range   Magnesium 2.3 1.7 - 2.4 mg/dL  Basic metabolic panel  Result Value Ref Range   Sodium 135 135 - 145 mmol/L   Potassium 3.7 3.5 - 5.1 mmol/L   Chloride 98 98 - 111 mmol/L   CO2 23 22 - 32 mmol/L   Glucose, Bld 109 (H) 70 - 99 mg/dL   BUN 28 (H) 8 - 23 mg/dL   Creatinine, Ser 1.31 (H) 0.44 - 1.00 mg/dL   Calcium 9.5 8.9 - 10.3 mg/dL   GFR, Estimated 42 (L) >60 mL/min   Anion gap 14 5 - 15  Magnesium  Result Value Ref Range   Magnesium 2.0 1.7 - 2.4 mg/dL  Lactate dehydrogenase  Result Value Ref Range   LDH 159 98 - 192 U/L  Uric acid   Result Value Ref Range   Uric Acid, Serum 10.9 (H) 2.5 - 7.1 mg/dL  TSH  Result Value Ref Range   TSH 3.130 0.350 - 4.500 uIU/mL  T4, free  Result Value Ref Range   Free T4 1.00 0.61 - 1.12 ng/dL  Lipid panel  Result Value Ref Range   Cholesterol 205 (H) 0 - 200 mg/dL   Triglycerides 127 <150 mg/dL   HDL 47 >40 mg/dL   Total CHOL/HDL Ratio 4.4 RATIO   VLDL 25 0 - 40 mg/dL   LDL Cholesterol 133 (H) 0 - 99 mg/dL  Lupus anticoagulant panel  Result Value Ref Range   PTT Lupus Anticoagulant 30.9 0.0 - 43.5 sec   DRVVT 37.1 0.0 - 47.0 sec   Lupus Anticoag Interp Comment:   Antithrombin III  Result Value Ref Range   AntiThromb III Func 123 (H) 75 - 120 %  Factor 5 leiden  Result Value Ref Range   Recommendations-F5LEID: Comment    Reviewed By: Jane Canary, PhD   APTT  Result Value Ref Range   aPTT 31 24 - 36 seconds  Hemoglobin A1c  Result Value Ref Range   Hgb A1c MFr  Bld 5.5 4.8 - 5.6 %   Mean Plasma Glucose 111.15 mg/dL  HIV Antibody (routine testing w rflx)  Result Value Ref Range   HIV Screen 4th Generation wRfx Non Reactive Non Reactive  Hepatitis B surface antigen  Result Value Ref Range   Hepatitis B Surface Ag NON REACTIVE NON REACTIVE  Prealbumin  Result Value Ref Range   Prealbumin 25 18 - 38 mg/dL  Hepatitis B core antibody, total  Result Value Ref Range   Hep B Core Total Ab NON REACTIVE NON REACTIVE  Hepatitis B surface antibody,quantitative  Result Value Ref Range   Hep B S AB Quant (Post) <3.1 (L) Immunity>9.9 mIU/mL  Basic metabolic panel  Result Value Ref Range   Sodium 135 135 - 145 mmol/L   Potassium 4.8 3.5 - 5.1 mmol/L   Chloride 98 98 - 111 mmol/L   CO2 25 22 - 32 mmol/L   Glucose, Bld 98 70 - 99 mg/dL   BUN 29 (H) 8 - 23 mg/dL   Creatinine, Ser 1.37 (H) 0.44 - 1.00 mg/dL   Calcium 9.9 8.9 - 10.3 mg/dL   GFR, Estimated 40 (L) >60 mL/min   Anion gap 12 5 - 15  Magnesium  Result Value Ref Range   Magnesium 2.1 1.7 - 2.4 mg/dL  Basic  metabolic panel  Result Value Ref Range   Sodium 138 135 - 145 mmol/L   Potassium 4.0 3.5 - 5.1 mmol/L   Chloride 101 98 - 111 mmol/L   CO2 23 22 - 32 mmol/L   Glucose, Bld 114 (H) 70 - 99 mg/dL   BUN 27 (H) 8 - 23 mg/dL   Creatinine, Ser 1.29 (H) 0.44 - 1.00 mg/dL   Calcium 9.9 8.9 - 10.3 mg/dL   GFR, Estimated 43 (L) >60 mL/min   Anion gap 14 5 - 15  Basic metabolic panel  Result Value Ref Range   Sodium 138 135 - 145 mmol/L   Potassium 3.8 3.5 - 5.1 mmol/L   Chloride 94 (L) 98 - 111 mmol/L   CO2 22 22 - 32 mmol/L   Glucose, Bld 129 (H) 70 - 99 mg/dL   BUN 40 (H) 8 - 23 mg/dL   Creatinine, Ser 1.42 (H) 0.44 - 1.00 mg/dL   Calcium 10.5 (H) 8.9 - 10.3 mg/dL   GFR, Estimated 38 (L) >60 mL/min   Anion gap 22 (H) 5 - 15  POCT I-Stat EG7  Result Value Ref Range   pH, Ven 7.375 7.25 - 7.43   pCO2, Ven 42.9 (L) 44 - 60 mmHg   pO2, Ven 29 (LL) 32 - 45 mmHg   Bicarbonate 25.1 20.0 - 28.0 mmol/L   TCO2 26 22 - 32 mmol/L   O2 Saturation 53 %   Acid-Base Excess 0.0 0.0 - 2.0 mmol/L   Sodium 138 135 - 145 mmol/L   Potassium 4.6 3.5 - 5.1 mmol/L   Calcium, Ion 1.29 1.15 - 1.40 mmol/L   HCT 33.0 (L) 36.0 - 46.0 %   Hemoglobin 11.2 (L) 12.0 - 15.0 g/dL   Sample type MIXED VENOUS SAMPLE    Comment NOTIFIED PHYSICIAN   I-STAT 7, (LYTES, BLD GAS, ICA, H+H)  Result Value Ref Range   pH, Arterial 7.434 7.35 - 7.45   pCO2 arterial 29.2 (L) 32 - 48 mmHg   pO2, Arterial 127 (H) 83 - 108 mmHg   Bicarbonate 19.6 (L) 20.0 - 28.0 mmol/L   TCO2 20 (L) 22 - 32 mmol/L  O2 Saturation 99 %   Acid-base deficit 4.0 (H) 0.0 - 2.0 mmol/L   Sodium 143 135 - 145 mmol/L   Potassium 3.8 3.5 - 5.1 mmol/L   Calcium, Ion 1.00 (L) 1.15 - 1.40 mmol/L   HCT 28.0 (L) 36.0 - 46.0 %   Hemoglobin 9.5 (L) 12.0 - 15.0 g/dL   Sample type ARTERIAL   POCT I-Stat EG7  Result Value Ref Range   pH, Ven 7.378 7.25 - 7.43   pCO2, Ven 43.7 (L) 44 - 60 mmHg   pO2, Ven 30 (LL) 32 - 45 mmHg   Bicarbonate 25.7 20.0 -  28.0 mmol/L   TCO2 27 22 - 32 mmol/L   O2 Saturation 56 %   Acid-Base Excess 0.0 0.0 - 2.0 mmol/L   Sodium 139 135 - 145 mmol/L   Potassium 4.6 3.5 - 5.1 mmol/L   Calcium, Ion 1.29 1.15 - 1.40 mmol/L   HCT 33.0 (L) 36.0 - 46.0 %   Hemoglobin 11.2 (L) 12.0 - 15.0 g/dL   Sample type MIXED VENOUS SAMPLE    Comment NOTIFIED PHYSICIAN   ECHO TEE  Result Value Ref Range   Radius 0.80 cm   MV M vel 3.92 m/s   MV Peak grad 61.5 mmHg  Type and screen  Result Value Ref Range   ABO/RH(D) O POS    Antibody Screen NEG    Sample Expiration      03/20/2022,2359 Performed at [facility], [address], [place], [place] [address]    *Note: Due to a large number of results and/or encounters for the requested time period, some results have not been displayed. A complete set of results can be found in Results Review.    Assessment & Plan:   Problem List Items Addressed This Visit   None    No orders of the defined types were placed in this encounter.  No orders of the defined types were placed in this encounter.    ***Follow up plan: No follow-ups on file.  [name], MD

## 2022-03-25 DIAGNOSIS — G4700 Insomnia, unspecified: Secondary | ICD-10-CM | POA: Insufficient documentation

## 2022-03-25 NOTE — Assessment & Plan Note (Signed)
Appreciate cardiology care. 7 lbs down since IV diuresis, notes swallowing is better. Continues feeling fatigued.

## 2022-03-25 NOTE — Assessment & Plan Note (Addendum)
Recent kidney function with GFR 40s after IV diuresis.  She continues torsemide '20mg'$  daily.

## 2022-03-25 NOTE — Assessment & Plan Note (Signed)
Severe on latest evaluation, out of proportion to heard murmur.  Planned MitraClip next week.  Discussed activity restrictions until procedure.

## 2022-03-25 NOTE — Assessment & Plan Note (Signed)
Recurrent issue. Will Rx ambien '5mg'$  - previously tolerated well. Reviewed risk of nonbenzo hypnotics including parasomnias and habit forming nature. She states she will take 1/4 to 1/2 tab PRN.

## 2022-03-25 NOTE — Assessment & Plan Note (Signed)
Continues rare PRN alprazolam.

## 2022-03-25 NOTE — Assessment & Plan Note (Signed)
Acute on chronic resp failure, now on continuous oxygen via [place], has [employer] portable oxygen tank.

## 2022-03-27 ENCOUNTER — Ambulatory Visit (HOSPITAL_BASED_OUTPATIENT_CLINIC_OR_DEPARTMENT_OTHER)
Admission: RE | Admit: 2022-03-27 | Discharge: 2022-03-27 | Disposition: A | Discharge status: Home / Self Care | Payer: Medicare Other | Source: Ambulatory Visit | Attending: Cardiology | Admitting: Cardiology

## 2022-03-27 VITALS — BP 110/80 | HR 106 | Wt 171.6 lb

## 2022-03-27 DIAGNOSIS — D631 Anemia in chronic kidney disease: Secondary | ICD-10-CM | POA: Diagnosis present

## 2022-03-27 DIAGNOSIS — E860 Dehydration: Secondary | ICD-10-CM | POA: Diagnosis present

## 2022-03-27 DIAGNOSIS — R570 Cardiogenic shock: Secondary | ICD-10-CM | POA: Diagnosis not present

## 2022-03-27 DIAGNOSIS — Z66 Do not resuscitate: Secondary | ICD-10-CM | POA: Diagnosis not present

## 2022-03-27 DIAGNOSIS — Z8616 Personal history of COVID-19: Secondary | ICD-10-CM | POA: Diagnosis not present

## 2022-03-27 DIAGNOSIS — I110 Hypertensive heart disease with heart failure: Secondary | ICD-10-CM | POA: Insufficient documentation

## 2022-03-27 DIAGNOSIS — I341 Nonrheumatic mitral (valve) prolapse: Secondary | ICD-10-CM | POA: Diagnosis not present

## 2022-03-27 DIAGNOSIS — I340 Nonrheumatic mitral (valve) insufficiency: Secondary | ICD-10-CM

## 2022-03-27 DIAGNOSIS — I498 Other specified cardiac arrhythmias: Secondary | ICD-10-CM | POA: Diagnosis present

## 2022-03-27 DIAGNOSIS — J811 Chronic pulmonary edema: Secondary | ICD-10-CM | POA: Diagnosis not present

## 2022-03-27 DIAGNOSIS — Z9049 Acquired absence of other specified parts of digestive tract: Secondary | ICD-10-CM | POA: Insufficient documentation

## 2022-03-27 DIAGNOSIS — I130 Hypertensive heart and chronic kidney disease with heart failure and stage 1 through stage 4 chronic kidney disease, or unspecified chronic kidney disease: Secondary | ICD-10-CM | POA: Diagnosis present

## 2022-03-27 DIAGNOSIS — Z95810 Presence of automatic (implantable) cardiac defibrillator: Secondary | ICD-10-CM | POA: Insufficient documentation

## 2022-03-27 DIAGNOSIS — Z8719 Personal history of other diseases of the digestive system: Secondary | ICD-10-CM | POA: Insufficient documentation

## 2022-03-27 DIAGNOSIS — I428 Other cardiomyopathies: Secondary | ICD-10-CM | POA: Diagnosis present

## 2022-03-27 DIAGNOSIS — J9602 Acute respiratory failure with hypercapnia: Secondary | ICD-10-CM | POA: Diagnosis not present

## 2022-03-27 DIAGNOSIS — I5022 Chronic systolic (congestive) heart failure: Secondary | ICD-10-CM | POA: Insufficient documentation

## 2022-03-27 DIAGNOSIS — Z4682 Encounter for fitting and adjustment of non-vascular catheter: Secondary | ICD-10-CM | POA: Diagnosis not present

## 2022-03-27 DIAGNOSIS — J90 Pleural effusion, not elsewhere classified: Secondary | ICD-10-CM | POA: Diagnosis not present

## 2022-03-27 DIAGNOSIS — I5023 Acute on chronic systolic (congestive) heart failure: Secondary | ICD-10-CM | POA: Diagnosis present

## 2022-03-27 DIAGNOSIS — J449 Chronic obstructive pulmonary disease, unspecified: Secondary | ICD-10-CM | POA: Diagnosis not present

## 2022-03-27 DIAGNOSIS — I700 Atherosclerosis of aorta: Secondary | ICD-10-CM | POA: Diagnosis present

## 2022-03-27 DIAGNOSIS — Z79899 Other long term (current) drug therapy: Secondary | ICD-10-CM | POA: Insufficient documentation

## 2022-03-27 DIAGNOSIS — I5084 End stage heart failure: Secondary | ICD-10-CM | POA: Diagnosis present

## 2022-03-27 DIAGNOSIS — Z89422 Acquired absence of other left toe(s): Secondary | ICD-10-CM | POA: Diagnosis not present

## 2022-03-27 DIAGNOSIS — I509 Heart failure, unspecified: Secondary | ICD-10-CM | POA: Diagnosis not present

## 2022-03-27 DIAGNOSIS — I5082 Biventricular heart failure: Secondary | ICD-10-CM | POA: Diagnosis present

## 2022-03-27 DIAGNOSIS — F32A Depression, unspecified: Secondary | ICD-10-CM | POA: Diagnosis present

## 2022-03-27 DIAGNOSIS — N179 Acute kidney failure, unspecified: Secondary | ICD-10-CM | POA: Diagnosis present

## 2022-03-27 DIAGNOSIS — I059 Rheumatic mitral valve disease, unspecified: Secondary | ICD-10-CM | POA: Diagnosis not present

## 2022-03-27 DIAGNOSIS — N1831 Chronic kidney disease, stage 3a: Secondary | ICD-10-CM | POA: Diagnosis present

## 2022-03-27 DIAGNOSIS — I5042 Chronic combined systolic (congestive) and diastolic (congestive) heart failure: Secondary | ICD-10-CM | POA: Diagnosis not present

## 2022-03-27 DIAGNOSIS — I4719 Other supraventricular tachycardia: Secondary | ICD-10-CM | POA: Diagnosis not present

## 2022-03-27 DIAGNOSIS — J9601 Acute respiratory failure with hypoxia: Secondary | ICD-10-CM | POA: Diagnosis not present

## 2022-03-27 DIAGNOSIS — Z01818 Encounter for other preprocedural examination: Secondary | ICD-10-CM | POA: Diagnosis not present

## 2022-03-27 DIAGNOSIS — J479 Bronchiectasis, uncomplicated: Secondary | ICD-10-CM | POA: Diagnosis present

## 2022-03-27 DIAGNOSIS — Q245 Malformation of coronary vessels: Secondary | ICD-10-CM | POA: Diagnosis not present

## 2022-03-27 DIAGNOSIS — Z515 Encounter for palliative care: Secondary | ICD-10-CM | POA: Diagnosis not present

## 2022-03-27 LAB — BASIC METABOLIC PANEL
Anion gap: 12 (ref 5–15)
BUN: 22 mg/dL (ref 8–23)
CO2: 22 mmol/L (ref 22–32)
Calcium: 10.1 mg/dL (ref 8.9–10.3)
Chloride: 105 mmol/L (ref 98–111)
Creatinine, Ser: 1.03 mg/dL — ABNORMAL HIGH (ref 0.44–1.00)
GFR, Estimated: 56 mL/min — ABNORMAL LOW (ref >60)
Glucose, Bld: 91 mg/dL (ref 70–99)
Potassium: 4.3 mmol/L (ref 3.5–5.1)
Sodium: 139 mmol/L (ref 135–145)

## 2022-03-27 NOTE — Progress Notes (Addendum)
ADVANCED HF CLINIC NOTE   Primary Care: [name], MD Primary Cardiologist: Dr. [name] HF Cardiologist: Dr. [name]   Reason for Visit: Post hospital follow up for chronic systolic heart failure/ NICM and severe MR   HPI: Ashley [name] is a 76 y.o.with a history of morbid obesity, HTN, COPD, GERD , fibromyaligia, s/p partial colon resection for diverticulitis with re-connection, MGUS, LBBB and systolic HF due to NICM s/p CRT-D referred by Dr. [name] for further evaluation of her HF.  Has h/o NICM dating back to 2012 when she had PNA October 2012 ejection fraction  25-35%,  September 2013 ejection fraction 30-35%.  EF in 2019 had recovered to 60-65% but now EF is back down to 15-20%.    Admitted 6/20 with increased dyspnea and chest pain. Prior to admit she had been treated with levaquin + prednisone for bronchitis. CXR was concerning for interstitial edema. Bedside ECHO performed in the ED showed severely reduced LVEF-->15% and severe MR. Placed on IV lasix and set up for cath. In 6/20 had RHC/LHC with normal coronaries, EF 20-25%, and mildly elevated filling pressures. Renal function has been stable. Underwent upgrade to CRT-D  Echo 10/21: EF 25%  She had COVID 7/21, started on Paxlovid. Subsequently developed diarrhea and was admitted for dehydration and AKI. Received IVFs and meds temporarily held but able to resume HF regimen. Also w/ hypoxia. D-dimer was elevated by chest CT negative for PE. Hypoxia felt 2/2 COVID and sent home w/ home O2.   Echo 8/22 showed EF 30%. RV normal.   Follow up 9/22, NYHA II-III, volume OK. Discussed possible CPX, but felt it would not change management.  Echo 5/23 EF 25-30%.  Admitted 8/23 with CAP and a/c CHF after stopping her diuretics. Diuresed with IV Bumex. Transitioned to oral torsemide. Entresto 49/51 stopped. Discharged home, weight 148 lbs.  She was seen in the HF clinic 10/26 and was volume overloaded. Furoscix ordered. Seen back  for return f/u on 10/30. Volume improved with SQ lasix but was still symptomatic w/ NYHA IIIB-IV symptoms, despite obtainment of euvolemia. Echo was done and showed EF 20% and severe MR. Felt to be nearing end-stage HF. We discussed MitraClip versus possible VAD and she was referred for further work-up.   She was set up for outpatient RHC and TEE 11/2. Studies showed severe MR and volume overload with low output, CI 1.8. She was admitted for VAD vs mTEER evals but patient ultimately decided she does not want VAD. Agreeable to TEER. Referred to Structural Heart Team. Seen by Dr. Burt Knack. Scheduled for MitraClip 11/16.  She presents today for hospital f/u. Here w/ husband. Continues w/ NYHA Class III-lllb symptoms. Wt fairly stable on home scale. ReDs 35%. Reports full compliance w/ diuretics. BP 110/80. Denies dizziness/ no orthostatic symptoms.   Medtronic Device interrogation: Fluid index below threshold. No VT/VF. 98% Biv paced. Activity level ~1h/day   Cardiac Studies  - Echo (5/23): EF 25-30% Global HK, RV normal, and severe MR - Echo (8/22): EF 30%, RV normal  - Echo (6/20): EF 15-20%. RV normal, Severe central mitral valve regurgitation.   - R/LHC (6/20): normal cors LVEF 20-25.  RA 8 PCWP 21 CO 4.45 CI 2.35    - Echo (6/20): EF 15-20%. RV normal, Severe central mitral valve regurgitation.    Echo 10/27 EF 20-25% Global HK, RV normal. Severe MVR  RHC 03/16/22 RA = 12 RV = 55/16 PA = 53/31 (40) PCW = 39 (v = 55) Fick  cardiac output/index = 3.4/1.8 PVR = 0.3 WU  Ao sat = 99% PA sat = 53%, 56% 1. Normal coronary arteries with anomalous RCA 2. Severe LV dysfunction EF 15% due to NICM 3. Markedly elevated filling pressures with prominent v-waves in PCWP tracing c/w known severe MR   TEE 03/16/22 Left ventricular ejection fraction, by estimation, is <20%. The left ventricle has severely decreased function. The left ventricular internal cavity size was  severely dilated. 1. Right ventricular systolic function is moderately reduced. The right ventricular size is normal. 2. Left atrial size was severely dilated. No left atrial/left atrial appendage thrombus was detected. 3. Mitral annulus is dilated. There is posterior leaflet tethering with severe (4+) central MR with systolic flow reversal in pulmonary veins. The mitral valve is abnormal. Severe mitral valve regurgitation. No evidence of mitral stenosis. 4. The aortic valve is tricuspid. There is mild calcification of the aortic valve. Aortic valve regurgitation is trivial. Aortic valve sclerosis/calcification is present, without any evidence of aortic stenosis.    Device interrogation: Fluid index well below threshold. Impedance up. Activity ~ 1hour per day. No VT. No AF.     Review of systems complete and found to be negative unless listed in HPI.   Past Medical History:  Diagnosis Date   Abdominal aortic atherosclerosis ([place]) 11/2015   by CT   Allergy    Anemia    Anxiety    Automatic implantable cardioverter-defibrillator in situ    a. 2012 s/p MDT single lead ICD; b. 10/2018 BiV upgrade-->MDT ser # [id number] H.   Bronchiectasis    a. possibly mild, treat URIs aggressively   Cancer ([place])    basal cell, arms & neck   Chronic sinusitis    COPD (chronic obstructive pulmonary disease) ([place])    COVID-19 12/06/2020   Depression    Dyspnea    Essential hypertension    Pt denies   Family history of adverse reaction to anesthesia    Mother - PONV   Fibrocystic breast disease    Fibromyalgia    GERD (gastroesophageal reflux disease)    H/O multiple allergies 10/10/2013   Hemorrhoids    HFrEF (heart failure with reduced ejection fraction) ([place])    a. 06/2011 cMRI: EF 34%, no LGE; b. 01/2016 Echo: EF 35-40%; c. 07/2016 Echo: EF 40-45%; d. 02/2020 Echo: EF 20-25%, glob HK. GrI DD. Mildly red RV fxn. Mildly dil LA. Triv MR.   History of diverticulitis of colon    History of  pneumonia 2012   Insomnia    Irritable bowel syndrome    Laryngeal nodule    Dr. Thomasena Edis   Migraine without aura, without mention of intractable migraine without mention of status migrainosus    Mitral regurgitation    Monoclonal gammopathy    Neuropathy    feet and hands   Nonischemic cardiomyopathy ([place])    a. 2012 s/p single lead ICD; b. 06/2011 cMRI: EF 34%, no LGE; b. 01/2016 Echo: EF 35-40%; c. 07/2016 Echo: EF 40-45%; d. 10/2018 Cath: nl cors. EF 20-25%; e. 10/2018 s/p BiV MDT ICD upgrade; f. 02/2020 Echo: EF 20-25%, glob HK. GrI DD.   Osteoarthritis    knees, back, hands, low back,    Osteoporosis 01/2013   T-2.7 spine (01/2016)   PONV (postoperative nausea and vomiting)    sigmoid diverticulitis with perforation, abscess and fistula 09/30/2013   Sleep apnea 2010   borderline, inconclusive- /w [place]    Current Outpatient Medications  Medication Sig Dispense Refill  acetaminophen (TYLENOL) 500 MG tablet Take 250 mg by mouth daily as needed for moderate pain or headache (PAIN).     albuterol (VENTOLIN HFA) 108 (90 Base) MCG/ACT inhaler Inhale 2 puffs into the lungs every 6 (six) hours as needed for wheezing or shortness of breath.     ALPRAZolam (XANAX) 0.25 MG tablet TAKE 1 TABLET BY MOUTH 2 TIMES DAILY AS NEEDED FOR ANXIETY. (Patient taking differently: Take 0.125 mg by mouth 2 (two) times daily as needed for anxiety.) 20 tablet 0   aspirin EC 81 MG tablet Take 81 mg by mouth at bedtime.     beclomethasone (QVAR) 80 MCG/ACT inhaler Inhale 2 puffs into the lungs 2 (two) times daily. 1 each 5   BENZONATATE PO Take by mouth daily as needed for cough.     calcium elemental as carbonate (TUMS ULTRA 1000) 400 MG chewable tablet Chew 1,000 mg by mouth daily as needed for heartburn.     Cholecalciferol (VITAMIN D3) 50 MCG (2000 UT) capsule Take 1 capsule (2,000 Units total) by mouth daily.     Coenzyme Q10 200 MG capsule Take 200 mg by mouth daily.     dextromethorphan (DELSYM) 30  MG/5ML liquid Take 2.5 mLs by mouth daily as needed for cough.     docusate sodium (COLACE) 100 MG capsule Take 100 mg by mouth every morning.     ezetimibe (ZETIA) 10 MG tablet TAKE 1 TABLET BY MOUTH EVERY DAY (Patient taking differently: Take 10 mg by mouth at bedtime.) 90 tablet 3   guaiFENesin (MUCINEX) 600 MG 12 hr tablet Take 1 tablet (600 mg total) by mouth 2 (two) times daily as needed for to loosen phlegm. (Patient taking differently: Take 600 mg by mouth daily.)     levalbuterol (XOPENEX HFA) 45 MCG/ACT inhaler Inhale 2 puffs into the lungs every 6 (six) hours as needed for wheezing or shortness of breath. (Patient taking differently: Inhale 1-2 puffs into the lungs every 4 (four) hours as needed for wheezing or shortness of breath (Asthma).) 15 g 3   Menthol, Topical Analgesic, (BIOFREEZE ROLL-ON EX) Apply 1 Application topically daily as needed (For shoulder pain).     miconazole (MICOTIN) 2 % powder Apply 1 application  topically daily as needed (yeast infection on stomach).     nystatin (MYCOSTATIN) 100000 UNIT/ML suspension TAKE 5 MLS (500,000 UNITS TOTAL) BY MOUTH 3 (THREE) TIMES DAILY. 120 mL 0   nystatin cream (MYCOSTATIN) Apply 1 application. topically as needed for dry skin (yeast infection). 30 g 3   OXYGEN Inhale 2 L into the lungs continuous.     polyethylene glycol (MIRALAX / GLYCOLAX) 17 g packet Take 17 g by mouth daily as needed for mild constipation or moderate constipation.     potassium chloride 20 MEQ TBCR Take 20 mEq by mouth daily. 90 tablet 3   sodium chloride (OCEAN) 0.65 % SOLN nasal spray Place 1 spray into both nostrils 2 (two) times daily.     spironolactone (ALDACTONE) 25 MG tablet Take 25 mg by mouth daily.     torsemide (DEMADEX) 20 MG tablet Take 1 tablet (20 mg total) by mouth daily. Take extra torsemide 10 mg with potassium as needed for weight >174 90 tablet 3   VITAMIN E PO Take 1 tablet by mouth daily.     zolpidem (AMBIEN) 5 MG tablet Take 0.5-1  tablets (2.5-5 mg total) by mouth at bedtime as needed for sleep. (Patient taking differently: Take 1.25 mg by mouth  at bedtime as needed for sleep.) 15 tablet 0   No current facility-administered medications for this encounter.   Allergies  Allergen Reactions   Chlorhexidine Other (See Comments)    blisters   Codeine Other (See Comments)    Confusion and dizziness Makes pain level worse   Cyclobenzaprine Other (See Comments)    Adverse reaction - back and throat pain, dizziness, exhaustion   Cymbalta [Duloxetine Hcl] Other (See Comments)    "body spasms and made me feel weird in my head"    Diflucan [Fluconazole] Nausea And Vomiting   Dilaudid [Hydromorphone Hcl] Other (See Comments)    Feels like she is burning internally    Influenza Vac Split [Influenza Virus Vaccine] Other (See Comments)    "allergic to concentrated eggs that are in vaccine"   Lasix [Furosemide] Other (See Comments)    Severe hypotension on lasix 40 PO (can handle 20 mg oral dosing) 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  Can take a different amount   Latex Nausea Only, Rash and Other (See Comments)    Pain and infection   Lipitor [Atorvastatin] Other (See Comments)    Severe muscle cramps to generic atorvastatin.  Able to take brand lipitor.   Morphine And Related Other (See Comments)    Internally feels like she is on fire    Pravastatin Other (See Comments)    myalgias   Prevacid [Lansoprazole] Other (See Comments)    Worsened GI side effects   Prevnar [Pneumococcal 13-Val Conj Vacc] Other (See Comments)    Egg allergy Bad reaction after shot   Rosuvastatin Other (See Comments)    Was not effective controlling lipids   Simvastatin Other (See Comments)    Leg cramps   Tape Other (See Comments) and Rash    All adhesives  - Blisters, itching and burning  blistering   Farxiga [Dapagliflozin] Other (See Comments)    Unknown reaction    Metaxalone Other (See Comments)    Unknown reaction    Tramadol     Leg cramps    Antihistamines, Chlorpheniramine-Type Other (See Comments)    Alters vision   Betadine [Povidone Iodine] Rash    Per pt.    Onion Diarrhea and Other (See Comments)    GI distress, flared IBS   Other Hives and Rash    NOTE: pt is able to take cephalosporins without reaction   Penicillins Other (See Comments)    CHILDHOOD ALLEGY/REACTION: "told 50 years ago I couldn't take it; might be immune to it", able to take amoxicillin Has patient had a PCN reaction causing immediate rash, facial/tongue/throat swelling, SOB or lightheadedness with hypotension: Unknown Has patient had a PCN reaction causing severe rash involving mucus membranes or skin necrosis: UnknowN Has patient had a PCN reaction that required hospitalization: /Unknown Has patient had a PCN reaction occurring within the last 10 years: Unknown If a   Red Dye Nausea Only and Swelling   Sertraline Other (See Comments)    Overly sedating   Sulfasalazine Other (See Comments)    "makes my whole body smell like sulfa & makes me sick just smelling it"    Social History   Socioeconomic History   Marital status: Married    Spouse name: Not on file   Number of children: 1   Years of education: Not on file   Highest education level: Not on file  Occupational History   Occupation: Oncologist   Occupation: Aeronautical engineer  Tobacco Use  Smoking status: Never   Smokeless tobacco: Never   Tobacco comments:    Lived with smokers x 23 yrs, smoked herself "for a week"  Vaping Use   Vaping Use: Never used  Substance and Sexual Activity   Alcohol use: No    Alcohol/week: 0.0 standard drinks of alcohol   Drug use: No   Sexual activity: Not Currently  Other Topics Concern   Not on file  Social History Narrative   Lives with husband Herbie [place]   1 adopted child   Social Determinants of Health   Financial Resource Strain: Medium Risk (05/24/2021)   Overall Financial Resource Strain (CARDIA)     Difficulty of Paying Living Expenses: Somewhat hard  Food Insecurity: No Food Insecurity (03/18/2022)   Hunger Vital Sign    Worried About Running Out of Food in the Last Year: Never true    Ran Out of Food in the Last Year: Never true  Transportation Needs: Unmet Transportation Needs (03/18/2022)   PRAPARE - Hydrologist (Medical): Yes    Lack of Transportation (Non-Medical): No  Physical Activity: Insufficiently Active (12/01/2019)   Exercise Vital Sign    Days of Exercise per Week: 3 days    Minutes of Exercise per Session: 30 min  Stress: Stress Concern Present (12/01/2019)   [place]    Feeling of Stress : Rather much  Social Connections: Not on file  Intimate Partner Violence: Not At Risk (03/18/2022)   Humiliation, Afraid, Rape, and Kick questionnaire    Fear of Current or Ex-Partner: No    Emotionally Abused: No    Physically Abused: No    Sexually Abused: No   Family History  Problem Relation Age of Onset   Lung cancer Father        + smoker   Colon polyps Father    Dementia Mother    Osteoporosis Mother        Lumbar spine   Stroke Mother        x 5 @ 16 YOA   Arthritis Mother        hands   Emphysema Mother    Alcohol abuse Paternal Grandfather    Stroke Paternal Grandfather        ?   Heart disease Paternal Grandfather    Hypertension Maternal Grandfather        ?   Heart disease Paternal Grandmother    Colon cancer Neg Hx    Esophageal cancer Neg Hx    Gallbladder disease Neg Hx    BP 110/80   Pulse (!) 106   Wt 77.8 kg (171 lb 9.6 oz)   SpO2 96%   BMI 28.56 kg/m   Wt Readings from Last 3 Encounters:  03/27/22 77.8 kg (171 lb 9.6 oz)  03/24/22 77.2 kg (170 lb 3.2 oz)  03/19/22 75 kg (165 lb 5.5 oz)   PHYSICAL EXAM: General:  Well appearing. No respiratory difficulty HEENT: normal Neck: supple. JVD 7 cm. Carotids 2+ bilat; no bruits. No lymphadenopathy  or thyromegaly appreciated. Cor: PMI nondisplaced. Regular rate & rhythm. 3/6 MR murmur  Lungs: clear Abdomen: soft, nontender, nondistended. No hepatosplenomegaly. No bruits or masses. Good bowel sounds. Extremities: no cyanosis, clubbing, rash, edema Neuro: alert & oriented x 3, cranial nerves grossly intact. moves all 4 extremities w/o difficulty. Affect pleasant.  Medtronic Device interrogation: Fluid index below threshold. No VT/VF. 98% Biv paced. Activity level ~1h/day  ASSESSMENT & PLAN:  1. Chronic Systolic Heart Failure - h/o NICM dating back to 2012 with fluctuating EF. EF improved to 60% in 2019 but then dropped again  - Echo 6/20 EF 20%  - L/RHC 6/20 with normal coronaries and mildly elevated filling pressures.  - Underwent CRT-D upgrade 6/20 but EF has not improved - Echo 10/21 EF 25% - Echo 8/22 EF 30%, RV normal  - Echo 5/23 EF 25-30%. - Echo 02/2022 EF 20-25% Severe MR - [facility] 11/23 Normal coronaries with anomalous RCA, Severe LV dysfunction EF 15% due to NICM, Markedly elevated filling pressures with prominent v-waves in PCWP tracing c/w known severe MR. CI 1.8. Pt decided she does not want LVAD. - Scheduled for MitraClip 11/16  - NYHA IIIb. ReDs 35%. Device interrogation Index < threshold. No VT/VF, 98% BiV paced  - Continue torsemide 20 mg daily  - Continue Spironolactone 25 mg daily  - Did not tolerate Farxiga  - Off losartan w/ low BP  - Off ? blocker w/ low BP and low output  - Check BMP today   2. MR -  Functional MR - TEE 11/23 showed dilated annulus. Restricted P2. Severe central MR with several jets over P2 and P1. PISA 0.8cm. No stenosis.  - scheduled for MitraClip w/ Dr. Burt Knack on 11/16   D/w Structural Heart Team. Will plan MitraClip on 11/16. AHF team will be available during  hospital stay if needed. She will f/u in structural clinic 4 wks post procedure w/ echo. F/u in the [facility] in 1 wk post procedure.   Sheily Lineman  PA-C  3:22 PM

## 2022-03-27 NOTE — Patient Instructions (Signed)
RedsClip done today.  Labs done today. We will contact you only if your labs are abnormal.  No medication changes were made. Please continue all current medications as prescribed.  Your physician recommends that you schedule a follow-up appointment in: 6 weeks  If you have any questions or concerns before your next appointment please send [place] a message through [place] or call our office at [phone].    TO LEAVE A MESSAGE FOR THE NURSE SELECT OPTION 2, PLEASE LEAVE A MESSAGE INCLUDING: YOUR NAME DATE OF BIRTH CALL BACK NUMBER REASON FOR CALL**this is important as we prioritize the call backs  YOU WILL RECEIVE A CALL BACK THE SAME DAY AS LONG AS YOU CALL BEFORE 4:00 PM   Do the following things EVERYDAY: Weigh yourself in the morning before breakfast. Write it down and keep it in a log. Take your medicines as prescribed Eat low salt foods--Limit salt (sodium) to 2000 mg per day.  Stay as active as you can everyday Limit all fluids for the day to less than 2 liters   At the [facility], you and your health needs are our priority. As part of our continuing mission to provide you with exceptional heart care, we have created designated Provider Care Teams. These Care Teams include your primary Cardiologist (physician) and Advanced Practice Providers (APPs- Physician Assistants and Nurse Practitioners) who all work together to provide you with the care you need, when you need it.   You may see any of the following providers on your designated Care Team at your next follow up: Dr Glori Bickers Dr Haynes Kerns, NP Lyda Jester, [place] Audry Riles, PharmD   Please be sure to bring in all your medications bottles to every appointment.

## 2022-03-27 NOTE — Addendum Note (Signed)
Encounter addended by: Consuelo Pandy, PA-C on: 03/27/2022 3:50 PM  Actions taken: Clinical Note Signed

## 2022-03-27 NOTE — Telephone Encounter (Signed)
Reviewed MitraClip instructions in person with the patient. Informed her of the Pharmacy's plan to get her meds when she arrives so she can have her own. She was grateful for assistance. Instructions sent with patient.

## 2022-03-27 NOTE — Progress Notes (Signed)
ReDS Vest / Clip - 03/27/22 1500       ReDS Vest / Clip   Station Marker A    Ruler Value 27    ReDS Value Range Low volume    ReDS Actual Value 35

## 2022-03-28 ENCOUNTER — Other Ambulatory Visit: Payer: Self-pay

## 2022-03-28 DIAGNOSIS — I340 Nonrheumatic mitral (valve) insufficiency: Secondary | ICD-10-CM

## 2022-03-29 ENCOUNTER — Encounter: Payer: Self-pay | Admitting: Internal Medicine

## 2022-03-29 NOTE — Progress Notes (Signed)
PERIOPERATIVE PRESCRIPTION FOR IMPLANTED CARDIAC DEVICE PROGRAMMING  Patient Information: Name:  Ashley [name]  DOB:  [birth year]  MRN:  [id number]    Planned Procedure:  Transcatheter Mitral Valve Repair  Surgeon:  Dr. Sherren Mocha  Date of Procedure:  04/01/2022  Cautery will be used.  Position during surgery:  Supine   Please send documentation back to:  [facility] (Fax # [phone])   Device Information:  Clinic EP Physician:  Cristopher Peru, MD   Device Type:  Defibrillator Manufacturer and Phone #:  Medtronic: [id number] Pacemaker Dependent?:  Unknown Date of Last Device Check:  03/27/2022 Normal Device Function?:  Yes.    Electrophysiologist's Recommendations:  Have magnet available. Provide continuous ECG monitoring when magnet is used or reprogramming is to be performed.  Procedure will likely interfere with device function.  Device should be programmed:  Tachy therapies disabled  Per Device Clinic Standing Orders, Simone Curia, RN  1:18 PM 03/29/2022

## 2022-03-29 NOTE — Anesthesia Preprocedure Evaluation (Addendum)
Anesthesia Evaluation  Patient identified by MRN, date of birth, ID band Patient awake    Reviewed: Allergy & Precautions, NPO status , Patient's Chart, lab work & pertinent test results  History of Anesthesia Complications (+) PONV and history of anesthetic complications  Airway Mallampati: III  TM Distance: >3 FB Neck ROM: Limited    Dental   Pulmonary sleep apnea , COPD   breath sounds clear to auscultation       Cardiovascular hypertension, Pt. on medications +CHF  + dysrhythmias + Cardiac Defibrillator  Rhythm:Regular Rate:Normal     Neuro/Psych  Headaches  Neuromuscular disease    GI/Hepatic Neg liver ROS,GERD  ,,  Endo/Other  negative endocrine ROS    Renal/GU Renal disease     Musculoskeletal  (+) Arthritis ,  Fibromyalgia -  Abdominal   Peds  Hematology  (+) Blood dyscrasia, anemia   Anesthesia Other Findings   Reproductive/Obstetrics                             Anesthesia Physical Anesthesia Plan  ASA: 4  Anesthesia Plan: General   Post-op Pain Management: Tylenol PO (pre-op)*   Induction: Intravenous  PONV Risk Score and Plan: 4 or greater and Dexamethasone, Ondansetron and Treatment may vary due to age or medical condition  Airway Management Planned: Oral ETT  Additional Equipment: Arterial line  Intra-op Plan:   Post-operative Plan: Extubation in OR  Informed Consent: I have reviewed the patients History and Physical, chart, labs and discussed the procedure including the risks, benefits and alternatives for the proposed anesthesia with the patient or authorized representative who has indicated his/her understanding and acceptance.       Plan Discussed with:   Anesthesia Plan Comments: (PAT note written 03/29/2022 by Myra Gianotti, PA-C.  )       Anesthesia Quick Evaluation

## 2022-03-29 NOTE — Progress Notes (Signed)
Anesthesia Chart Review: SAME DAY WORK-UP  Case: [id number] Date/Time: 04/10/2022 1330   Procedures:      MITRAL VALVE REPAIR     TRANSESOPHAGEAL ECHOCARDIOGRAM (TEE)   Anesthesia type: General   Diagnosis: Severe mitral insufficiency [I34.0]   Pre-op diagnosis: severe mitral insufficiency   Location: [facility] CATH [facility] 6 / [place] INVAS[facility]   Providers: Sherren Mocha, MD       DISCUSSION: Patient is a 76 year old female scheduled for the above procedure.  History includes never smoker, postoperative N/V, non-ischemic cardiomyopathy (diagnosed 2012), severe mitral regurgitation, HFrEF, Medtronic ICD (single chamber 07/14/11, update BiV 11/04/18), HTN, anemia, fibromyalgia, IBS, sigmoid diverticulitis (with perforation, s/p sigmoid colectomy with colostomy 10/02/12, s/p colostomy reversal 02/10/14), neuropathy, MGUS, COPD (home O2 2L), dyspnea, laryngeal nodule, "borderline" OSA.   EP ICD Periprocedure Recommendations: Device Information: Clinic EP Physician:  Cristopher Peru, MD  Device Type:  Defibrillator Manufacturer and Phone #:  Medtronic: [id number] Pacemaker Dependent?:  Unknown Date of Last Device Check:  03/27/2022     Normal Device Function?:  Yes.     Electrophysiologist's Recommendations: Have magnet available. Provide continuous ECG monitoring when magnet is used or reprogramming is to be performed.  Procedure will likely interfere with device function.  Device should be programmed:  Tachy therapies disabled  Anesthesia team to evaluate on the day of procedure.  Labs on arrival as indicated.   VS:  BP Readings from Last 3 Encounters:  03/27/22 110/80  03/24/22 110/64  03/19/22 105/66   Pulse Readings from Last 3 Encounters:  03/27/22 (!) 106  03/24/22 100  03/19/22 92     PROVIDERS: [name], MD is PCP  [name], MD is primary cardiologist Glori Bickers, MD is HF cardiologist Sherren Mocha, MD is structural heart cardiologist Cristopher Peru, MD  is EP cardiologist Vernard Gambles, MD is pulmonologist Silvano Rusk, MD is GI   LABS: Most recent lab results in [facility] include: Lab Results  Component Value Date   WBC 13.4 (H) 03/16/2022   HGB 12.7 03/16/2022   HCT 37.8 03/16/2022   PLT 275 03/16/2022   ALT 27 03/16/2022   AST 23 03/16/2022   NA 139 03/27/2022   K 4.3 03/27/2022   CL 105 03/27/2022   CREATININE 1.03 (H) 03/27/2022   BUN 22 03/27/2022   CO2 22 03/27/2022   TSH 3.130 03/17/2022   HGBA1C 5.5 03/17/2022    PFTs 11/04/19: "FEV1 was 1.97 L or 88% predicted, FVC C was 2.72 L or 91% predicted, FEV1/FVC was 72%.  There was no bronchodilator response.  ERV was 12% consistent with obesity. There was no overt air trapping or hyperinflation (my review).  Flow volume loop was normal.  Diffusion capacity normal.  Normal PFTs for age."    IMAGES: CXR 03/07/22: IMPRESSION: Evidence of acute CHF with small right greater than left pleural effusion, however, the overall appearance is improved when compared to the chest x-ray 01/01/2022    EKG: 03/17/22: Atrial-sensed ventricular-paced rhythm Abnormal ECG When compared with ECG of 17-Mar-2022 11:40, No significant change was found Confirmed by Buford Dresser [phone]) on 03/19/2022 9:58:54 PM    CV: TEE 03/16/22: IMPRESSIONS   1. Left ventricular ejection fraction, by estimation, is <20%. The left  ventricle has severely decreased function. The left ventricular internal  cavity size was severely dilated.   2. Right ventricular systolic function is moderately reduced. The right  ventricular size is normal.   3. Left atrial size was severely dilated. No left atrial/left  atrial  appendage thrombus was detected.   4. Mitral annulus is dilated. There is posterior leaflet tethering with  severe (4+) central MR with systolic flow reversal in pulmonary veins. The  mitral valve is abnormal. Severe mitral valve regurgitation. No evidence  of mitral stenosis.   5. The  aortic valve is tricuspid. There is mild calcification of the  aortic valve. Aortic valve regurgitation is trivial. Aortic valve  sclerosis/calcification is present, without any evidence of aortic  stenosis.    RHC/LHC 03/16/22: Findings: RA = 12 RV = 55/16 PA = 53/31 (40) PCW = 39 (v = 55) Fick cardiac output/index = 3.4/1.8 PVR = 0.3 WU  Ao sat = 99% PA sat = 53%, 56%   Assessment: 1. Normal coronary arteries with anomalous RCA 2. Severe LV dysfunction EF 15% due to NICM 3. Markedly elevated filling pressures with prominent v-waves in PCWP tracing c/w known severe MR   Plan/Discussion: Admit for diuresis. Structural and VAD teams to see to help plan next steps. Suspect MV Clip may be best next step.    EP Procedure 11/04/18: Conclusion: Successful upgrade of a single chamber ICD to a biv ICD.    US Carotid 12/30/17: Final Interpretation:  - Right Carotid: The extracranial vessels were near-normal with only minimal wall thickening or plaque.  - Left Carotid: The extracranial vessels were near-normal with only minimal wall thickening or plaque.  - Vertebrals:  Bilateral vertebral arteries demonstrate antegrade flow.  - Subclavians: Normal flow hemodynamics were seen in bilateral subclavian arteries.    Past Medical History:  Diagnosis Date   Abdominal aortic atherosclerosis ([place]) 11/2015   by CT   Allergy    Anemia    Anxiety    Automatic implantable cardioverter-defibrillator in situ    a. 2012 s/p MDT single lead ICD; b. 10/2018 BiV upgrade-->MDT ser # [id number].   Bronchiectasis    a. possibly mild, treat URIs aggressively   Cancer ([place])    basal cell, arms & neck   Chronic sinusitis    COPD (chronic obstructive pulmonary disease) ([place])    COVID-19 12/06/2020   Depression    Dyspnea    Essential hypertension    Pt denies   Family history of adverse reaction to anesthesia    Mother - PONV   Fibrocystic breast disease    Fibromyalgia    GERD  (gastroesophageal reflux disease)    H/O multiple allergies 10/10/2013   Hemorrhoids    HFrEF (heart failure with reduced ejection fraction) ([place])    a. 06/2011 cMRI: EF 34%, no LGE; b. 01/2016 Echo: EF 35-40%; c. 07/2016 Echo: EF 40-45%; d. 02/2020 Echo: EF 20-25%, glob HK. GrI DD. Mildly red RV fxn. Mildly dil LA. Triv MR.   History of diverticulitis of colon    History of pneumonia 2012   Insomnia    Irritable bowel syndrome    Laryngeal nodule    Dr. Thomasena Edis   Migraine without aura, without mention of intractable migraine without mention of status migrainosus    Mitral regurgitation    Monoclonal gammopathy    Neuropathy    feet and hands   Nonischemic cardiomyopathy ([place])    a. 2012 s/p single lead ICD; b. 06/2011 cMRI: EF 34%, no LGE; b. 01/2016 Echo: EF 35-40%; c. 07/2016 Echo: EF 40-45%; d. 10/2018 Cath: nl cors. EF 20-25%; e. 10/2018 s/p BiV MDT ICD upgrade; f. 02/2020 Echo: EF 20-25%, glob HK. GrI DD.   Osteoarthritis    knees, back, hands,  low back,    Osteoporosis 01/2013   T-2.7 spine (01/2016)   PONV (postoperative nausea and vomiting)    sigmoid diverticulitis with perforation, abscess and fistula 09/30/2013   Sleep apnea 2010   borderline, inconclusive- /w [place]     Past Surgical History:  Procedure Laterality Date   ABDOMINAL HYSTERECTOMY  1987   w/BSO   ANAL RECTAL MANOMETRY N/A 02/02/2016   Procedure: ANO RECTAL MANOMETRY;  Surgeon: Mauri Pole, MD;  Location: [facility] ENDOSCOPY;  Service: Endoscopy;  Laterality: N/A;   APPENDECTOMY     AUGMENTATION MAMMAPLASTY     BIV UPGRADE N/A 11/04/2018   Procedure: BIV ICD  UPGRADE;  Surgeon: Evans Lance, MD;  Location: [place] CV LAB;  Service: Cardiovascular;  Laterality: N/A;   breast cystectomies     due to FCBD   BREAST MASS EXCISION  01/2010   Left-fibrocystic change w/intraductal papilloma, no malignancy   carotid US  68/1157   2-62% LICA, normal RICA   CATARACT EXTRACTION W/PHACO Right 03/08/2021    Procedure: CATARACT EXTRACTION PHACO AND INTRAOCULAR LENS PLACEMENT ([place]) RIGHT;  Surgeon: Birder Robson, MD;  Location: [place];  Service: Ophthalmology;  Laterality: Right;  13.01 1:15.7   [place] Bunionectomy  11/04/08   Right Great Toe (Dr. Beola Cord)   COLONOSCOPY  01/2018   1 small benign polyp, diverticulosis, healthy anastomosis [name] @ [facility])   COLOSTOMY N/A 10/02/2013   DESCENDING COLOSTOMY;  Surgeon: Imogene Burn. [place], MD   COLOSTOMY REVERSAL N/A 02/10/2014   [name], MD   EMG/MCV  10/16/01   + mild carpal tunnel   ESOPHAGOGASTRODUODENOSCOPY  09/2014   small HH, irregular Z line, fundic gland polyps with mild gastritis Olevia Perches)   Flex laryngoscopy  06/11/06   ([place]) nml   IMPLANTABLE CARDIOVERTER DEFIBRILLATOR IMPLANT N/A 07/14/2011   MDT single chamber ICD   KNEE ARTHROSCOPY W/ ORIF     Left; "meniscus tear"   LASIK     left eye   MANDIBLE SURGERY     "got about 6 pins in the bottom of my jaw"   NCS  11/2014   L ulnar neuropathy, mild B median nerve entrapment ([place])   OSTEOTOMY     Left foot   PARTIAL COLECTOMY N/A 10/02/2013   PARTIAL COLECTOMY;  Surgeon: Imogene Burn. Tsue[place], MD   RIGHT/LEFT HEART CATH AND CORONARY ANGIOGRAPHY N/A 10/16/2018   Procedure: RIGHT/LEFT HEART CATH AND CORONARY ANGIOGRAPHY;  Surgeon: Wellington Hampshire, MD;  Location: [place] [facility];  Service: Cardiovascular;  Laterality: N/A;   RIGHT/LEFT HEART CATH AND CORONARY ANGIOGRAPHY N/A 03/16/2022   Procedure: RIGHT/LEFT HEART CATH AND CORONARY ANGIOGRAPHY;  Surgeon: Jolaine Artist, MD;  Location: [place] [facility];  Service: Cardiovascular;  Laterality: N/A;   sinus surgery     x3 with balloon    TEE WITHOUT CARDIOVERSION N/A 03/16/2022   Procedure: TRANSESOPHAGEAL ECHOCARDIOGRAM (TEE);  Surgeon: Jolaine Artist, MD;  Location: [facility] ENDOSCOPY;  Service: Cardiovascular;  Laterality: N/A;   TOE AMPUTATION     "toe beside baby toe on left foot; got gangrene from corn"    [place]: No current facility-administered medications for this encounter.    acetaminophen (TYLENOL) 500 MG tablet   albuterol (VENTOLIN HFA) 108 (90 Base) MCG/ACT inhaler   ALPRAZolam (XANAX) 0.25 MG tablet   aspirin EC 81 MG tablet   beclomethasone (QVAR) 80 MCG/ACT inhaler   BENZONATATE PO   calcium  elemental as carbonate (TUMS ULTRA 1000) 400 MG chewable tablet   Cholecalciferol (VITAMIN D3) 50 MCG (2000 UT) capsule   Coenzyme Q10 200 MG capsule   dextromethorphan (DELSYM) 30 MG/5ML liquid   docusate sodium (COLACE) 100 MG capsule   ezetimibe (ZETIA) 10 MG tablet   guaiFENesin (MUCINEX) 600 MG 12 hr tablet   levalbuterol (XOPENEX HFA) 45 MCG/ACT inhaler   Menthol, Topical Analgesic, (BIOFREEZE ROLL-ON EX)   miconazole (MICOTIN) 2 % powder   nystatin (MYCOSTATIN) 100000 UNIT/ML suspension   nystatin cream (MYCOSTATIN)   OXYGEN   polyethylene glycol (MIRALAX / GLYCOLAX) 17 g packet   potassium chloride 20 MEQ TBCR   sodium chloride (OCEAN) 0.65 % SOLN nasal spray   torsemide (DEMADEX) 20 MG tablet   VITAMIN E PO   zolpidem (AMBIEN) 5 MG tablet   spironolactone (ALDACTONE) 25 MG tablet    Myra Gianotti, PA-C Surgical Short Stay/Anesthesiology [facility] Phone [phone] [facility] Phone [phone] 03/29/2022 1:55 PM

## 2022-03-29 NOTE — Telephone Encounter (Signed)
This encounter was created in error - please disregard.

## 2022-03-30 ENCOUNTER — Inpatient Hospital Stay (HOSPITAL_COMMUNITY)
Admission: RE | Admit: 2022-03-30 | Discharge: 2022-04-14 | DRG: 270 | Disposition: E | Payer: Medicare Other | Attending: Internal Medicine | Admitting: Internal Medicine

## 2022-03-30 ENCOUNTER — Encounter (HOSPITAL_COMMUNITY): Payer: Self-pay | Admitting: Cardiovascular Disease

## 2022-03-30 ENCOUNTER — Inpatient Hospital Stay (HOSPITAL_COMMUNITY): Payer: Medicare Other

## 2022-03-30 ENCOUNTER — Inpatient Hospital Stay (HOSPITAL_COMMUNITY): Payer: Medicare Other | Admitting: Vascular Surgery

## 2022-03-30 ENCOUNTER — Encounter (HOSPITAL_COMMUNITY): Admission: RE | Disposition: E | Payer: Self-pay | Source: Home / Self Care | Attending: Internal Medicine

## 2022-03-30 ENCOUNTER — Inpatient Hospital Stay (HOSPITAL_COMMUNITY)
Admission: RE | Admit: 2022-03-30 | Discharge: 2022-03-30 | Disposition: A | Discharge status: Home / Self Care | Payer: Medicare Other | Source: Ambulatory Visit | Attending: Cardiovascular Disease | Admitting: Cardiovascular Disease

## 2022-03-30 DIAGNOSIS — I5084 End stage heart failure: Secondary | ICD-10-CM | POA: Diagnosis present

## 2022-03-30 DIAGNOSIS — Z961 Presence of intraocular lens: Secondary | ICD-10-CM | POA: Diagnosis present

## 2022-03-30 DIAGNOSIS — I428 Other cardiomyopathies: Secondary | ICD-10-CM | POA: Diagnosis present

## 2022-03-30 DIAGNOSIS — J479 Bronchiectasis, uncomplicated: Secondary | ICD-10-CM | POA: Diagnosis present

## 2022-03-30 DIAGNOSIS — Z9049 Acquired absence of other specified parts of digestive tract: Secondary | ICD-10-CM

## 2022-03-30 DIAGNOSIS — I509 Heart failure, unspecified: Secondary | ICD-10-CM

## 2022-03-30 DIAGNOSIS — I5042 Chronic combined systolic (congestive) and diastolic (congestive) heart failure: Secondary | ICD-10-CM | POA: Diagnosis present

## 2022-03-30 DIAGNOSIS — I420 Dilated cardiomyopathy: Secondary | ICD-10-CM | POA: Diagnosis present

## 2022-03-30 DIAGNOSIS — I4719 Other supraventricular tachycardia: Secondary | ICD-10-CM | POA: Diagnosis not present

## 2022-03-30 DIAGNOSIS — J90 Pleural effusion, not elsewhere classified: Secondary | ICD-10-CM | POA: Diagnosis not present

## 2022-03-30 DIAGNOSIS — Z01818 Encounter for other preprocedural examination: Secondary | ICD-10-CM | POA: Diagnosis not present

## 2022-03-30 DIAGNOSIS — I110 Hypertensive heart disease with heart failure: Secondary | ICD-10-CM | POA: Diagnosis not present

## 2022-03-30 DIAGNOSIS — F32A Depression, unspecified: Secondary | ICD-10-CM | POA: Diagnosis present

## 2022-03-30 DIAGNOSIS — Z89422 Acquired absence of other left toe(s): Secondary | ICD-10-CM

## 2022-03-30 DIAGNOSIS — Z515 Encounter for palliative care: Secondary | ICD-10-CM | POA: Diagnosis not present

## 2022-03-30 DIAGNOSIS — J9602 Acute respiratory failure with hypercapnia: Secondary | ICD-10-CM | POA: Diagnosis not present

## 2022-03-30 DIAGNOSIS — I059 Rheumatic mitral valve disease, unspecified: Secondary | ICD-10-CM | POA: Diagnosis not present

## 2022-03-30 DIAGNOSIS — D631 Anemia in chronic kidney disease: Secondary | ICD-10-CM | POA: Diagnosis present

## 2022-03-30 DIAGNOSIS — M797 Fibromyalgia: Secondary | ICD-10-CM | POA: Diagnosis present

## 2022-03-30 DIAGNOSIS — J811 Chronic pulmonary edema: Secondary | ICD-10-CM | POA: Diagnosis not present

## 2022-03-30 DIAGNOSIS — I340 Nonrheumatic mitral (valve) insufficiency: Secondary | ICD-10-CM | POA: Diagnosis present

## 2022-03-30 DIAGNOSIS — Z79899 Other long term (current) drug therapy: Secondary | ICD-10-CM

## 2022-03-30 DIAGNOSIS — Z885 Allergy status to narcotic agent status: Secondary | ICD-10-CM

## 2022-03-30 DIAGNOSIS — G629 Polyneuropathy, unspecified: Secondary | ICD-10-CM | POA: Diagnosis present

## 2022-03-30 DIAGNOSIS — I5082 Biventricular heart failure: Secondary | ICD-10-CM | POA: Diagnosis present

## 2022-03-30 DIAGNOSIS — I5023 Acute on chronic systolic (congestive) heart failure: Secondary | ICD-10-CM | POA: Diagnosis present

## 2022-03-30 DIAGNOSIS — N179 Acute kidney failure, unspecified: Secondary | ICD-10-CM | POA: Diagnosis present

## 2022-03-30 DIAGNOSIS — N1831 Chronic kidney disease, stage 3a: Secondary | ICD-10-CM | POA: Diagnosis present

## 2022-03-30 DIAGNOSIS — J449 Chronic obstructive pulmonary disease, unspecified: Secondary | ICD-10-CM | POA: Diagnosis present

## 2022-03-30 DIAGNOSIS — Z888 Allergy status to other drugs, medicaments and biological substances status: Secondary | ICD-10-CM

## 2022-03-30 DIAGNOSIS — Z66 Do not resuscitate: Secondary | ICD-10-CM | POA: Diagnosis not present

## 2022-03-30 DIAGNOSIS — Z880 Allergy status to penicillin: Secondary | ICD-10-CM

## 2022-03-30 DIAGNOSIS — Z9079 Acquired absence of other genital organ(s): Secondary | ICD-10-CM

## 2022-03-30 DIAGNOSIS — Z91040 Latex allergy status: Secondary | ICD-10-CM

## 2022-03-30 DIAGNOSIS — K219 Gastro-esophageal reflux disease without esophagitis: Secondary | ICD-10-CM | POA: Diagnosis present

## 2022-03-30 DIAGNOSIS — Z90722 Acquired absence of ovaries, bilateral: Secondary | ICD-10-CM

## 2022-03-30 DIAGNOSIS — Z90710 Acquired absence of both cervix and uterus: Secondary | ICD-10-CM

## 2022-03-30 DIAGNOSIS — Z882 Allergy status to sulfonamides status: Secondary | ICD-10-CM

## 2022-03-30 DIAGNOSIS — E860 Dehydration: Secondary | ICD-10-CM | POA: Diagnosis present

## 2022-03-30 DIAGNOSIS — Z9102 Food additives allergy status: Secondary | ICD-10-CM

## 2022-03-30 DIAGNOSIS — J329 Chronic sinusitis, unspecified: Secondary | ICD-10-CM | POA: Diagnosis present

## 2022-03-30 DIAGNOSIS — R570 Cardiogenic shock: Secondary | ICD-10-CM | POA: Diagnosis present

## 2022-03-30 DIAGNOSIS — Z8249 Family history of ischemic heart disease and other diseases of the circulatory system: Secondary | ICD-10-CM

## 2022-03-30 DIAGNOSIS — J9601 Acute respiratory failure with hypoxia: Secondary | ICD-10-CM | POA: Diagnosis not present

## 2022-03-30 DIAGNOSIS — Z539 Procedure and treatment not carried out, unspecified reason: Secondary | ICD-10-CM | POA: Diagnosis not present

## 2022-03-30 DIAGNOSIS — R54 Age-related physical debility: Secondary | ICD-10-CM | POA: Diagnosis present

## 2022-03-30 DIAGNOSIS — Z91018 Allergy to other foods: Secondary | ICD-10-CM

## 2022-03-30 DIAGNOSIS — I498 Other specified cardiac arrhythmias: Secondary | ICD-10-CM | POA: Diagnosis present

## 2022-03-30 DIAGNOSIS — I480 Paroxysmal atrial fibrillation: Secondary | ICD-10-CM | POA: Diagnosis present

## 2022-03-30 DIAGNOSIS — Z85828 Personal history of other malignant neoplasm of skin: Secondary | ICD-10-CM

## 2022-03-30 DIAGNOSIS — Z4682 Encounter for fitting and adjustment of non-vascular catheter: Secondary | ICD-10-CM | POA: Diagnosis not present

## 2022-03-30 DIAGNOSIS — M810 Age-related osteoporosis without current pathological fracture: Secondary | ICD-10-CM | POA: Diagnosis present

## 2022-03-30 DIAGNOSIS — Z8616 Personal history of COVID-19: Secondary | ICD-10-CM | POA: Diagnosis not present

## 2022-03-30 DIAGNOSIS — Q245 Malformation of coronary vessels: Secondary | ICD-10-CM | POA: Diagnosis not present

## 2022-03-30 DIAGNOSIS — I447 Left bundle-branch block, unspecified: Secondary | ICD-10-CM | POA: Diagnosis present

## 2022-03-30 DIAGNOSIS — G4733 Obstructive sleep apnea (adult) (pediatric): Secondary | ICD-10-CM | POA: Diagnosis present

## 2022-03-30 DIAGNOSIS — Z9981 Dependence on supplemental oxygen: Secondary | ICD-10-CM

## 2022-03-30 DIAGNOSIS — I700 Atherosclerosis of aorta: Secondary | ICD-10-CM | POA: Diagnosis present

## 2022-03-30 DIAGNOSIS — F419 Anxiety disorder, unspecified: Secondary | ICD-10-CM | POA: Diagnosis present

## 2022-03-30 DIAGNOSIS — E782 Mixed hyperlipidemia: Secondary | ICD-10-CM | POA: Diagnosis present

## 2022-03-30 DIAGNOSIS — I130 Hypertensive heart and chronic kidney disease with heart failure and stage 1 through stage 4 chronic kidney disease, or unspecified chronic kidney disease: Secondary | ICD-10-CM | POA: Diagnosis present

## 2022-03-30 DIAGNOSIS — Z95810 Presence of automatic (implantable) cardiac defibrillator: Secondary | ICD-10-CM

## 2022-03-30 DIAGNOSIS — D472 Monoclonal gammopathy: Secondary | ICD-10-CM | POA: Diagnosis present

## 2022-03-30 HISTORY — PX: RIGHT HEART CATH: CATH118263

## 2022-03-30 HISTORY — PX: TEE WITHOUT CARDIOVERSION: SHX5443

## 2022-03-30 HISTORY — PX: IABP INSERTION: CATH118242

## 2022-03-30 HISTORY — PX: CENTRAL LINE INSERTION: CATH118232

## 2022-03-30 HISTORY — PX: MITRAL VALVE REPAIR: CATH118311

## 2022-03-30 LAB — POCT I-STAT EG7
Acid-base deficit: 6 mmol/L — ABNORMAL HIGH (ref 0.0–2.0)
Acid-base deficit: 7 mmol/L — ABNORMAL HIGH (ref 0.0–2.0)
Bicarbonate: 22.5 mmol/L (ref 20.0–28.0)
Bicarbonate: 23.4 mmol/L (ref 20.0–28.0)
Calcium, Ion: 1.33 mmol/L (ref 1.15–1.40)
Calcium, Ion: 1.34 mmol/L (ref 1.15–1.40)
HCT: 35 % — ABNORMAL LOW (ref 36.0–46.0)
HCT: 35 % — ABNORMAL LOW (ref 36.0–46.0)
Hemoglobin: 11.9 g/dL — ABNORMAL LOW (ref 12.0–15.0)
Hemoglobin: 11.9 g/dL — ABNORMAL LOW (ref 12.0–15.0)
O2 Saturation: 50 %
O2 Saturation: 51 %
Potassium: 4.1 mmol/L (ref 3.5–5.1)
Potassium: 4.1 mmol/L (ref 3.5–5.1)
Sodium: 137 mmol/L (ref 135–145)
Sodium: 137 mmol/L (ref 135–145)
TCO2: 25 mmol/L (ref 22–32)
TCO2: 25 mmol/L (ref 22–32)
pCO2, Ven: 65.9 mmHg — ABNORMAL HIGH (ref 44–60)
pCO2, Ven: 66.8 mmHg — ABNORMAL HIGH (ref 44–60)
pH, Ven: 7.142 — CL (ref 7.25–7.43)
pH, Ven: 7.153 — CL (ref 7.25–7.43)
pO2, Ven: 35 mmHg (ref 32–45)
pO2, Ven: 36 mmHg (ref 32–45)

## 2022-03-30 LAB — POCT I-STAT 7, (LYTES, BLD GAS, ICA,H+H)
Acid-base deficit: 6 mmol/L — ABNORMAL HIGH (ref 0.0–2.0)
Acid-base deficit: 8 mmol/L — ABNORMAL HIGH (ref 0.0–2.0)
Acid-base deficit: 5 mmol/L — ABNORMAL HIGH (ref 0.0–2.0)
Bicarbonate: 20 mmol/L (ref 20.0–28.0)
Bicarbonate: 23 mmol/L (ref 20.0–28.0)
Bicarbonate: 22 mmol/L (ref 20.0–28.0)
Calcium, Ion: 1.2 mmol/L (ref 1.15–1.40)
Calcium, Ion: 1.28 mmol/L (ref 1.15–1.40)
Calcium, Ion: 1.32 mmol/L (ref 1.15–1.40)
HCT: 30 % — ABNORMAL LOW (ref 36.0–46.0)
HCT: 35 % — ABNORMAL LOW (ref 36.0–46.0)
HCT: 35 % — ABNORMAL LOW (ref 36.0–46.0)
Hemoglobin: 10.2 g/dL — ABNORMAL LOW (ref 12.0–15.0)
Hemoglobin: 11.9 g/dL — ABNORMAL LOW (ref 12.0–15.0)
Hemoglobin: 11.9 g/dL — ABNORMAL LOW (ref 12.0–15.0)
O2 Saturation: 58 %
O2 Saturation: 92 %
O2 Saturation: 97 %
Patient temperature: 36.8
Potassium: 4.3 mmol/L (ref 3.5–5.1)
Potassium: 4.4 mmol/L (ref 3.5–5.1)
Potassium: 3.7 mmol/L (ref 3.5–5.1)
Sodium: 137 mmol/L (ref 135–145)
Sodium: 139 mmol/L (ref 135–145)
Sodium: 137 mmol/L (ref 135–145)
TCO2: 21 mmol/L — ABNORMAL LOW (ref 22–32)
TCO2: 25 mmol/L (ref 22–32)
TCO2: 23 mmol/L (ref 22–32)
pCO2 arterial: 50.1 mmHg — ABNORMAL HIGH (ref 32–48)
pCO2 arterial: 61 mmHg — ABNORMAL HIGH (ref 32–48)
pCO2 arterial: 49.1 mmHg — ABNORMAL HIGH (ref 32–48)
pH, Arterial: 7.185 — CL (ref 7.35–7.45)
pH, Arterial: 7.209 — ABNORMAL LOW (ref 7.35–7.45)
pH, Arterial: 7.258 — ABNORMAL LOW (ref 7.35–7.45)
pO2, Arterial: 38 mmHg — CL (ref 83–108)
pO2, Arterial: 76 mmHg — ABNORMAL LOW (ref 83–108)
pO2, Arterial: 100 mmHg (ref 83–108)

## 2022-03-30 LAB — COMPREHENSIVE METABOLIC PANEL
ALT: 17 U/L (ref 0–44)
AST: 18 U/L (ref 15–41)
Albumin: 3.9 g/dL (ref 3.5–5.0)
Alkaline Phosphatase: 103 U/L (ref 38–126)
Anion gap: 12 (ref 5–15)
BUN: 22 mg/dL (ref 8–23)
CO2: 20 mmol/L — ABNORMAL LOW (ref 22–32)
Calcium: 9.9 mg/dL (ref 8.9–10.3)
Chloride: 106 mmol/L (ref 98–111)
Creatinine, Ser: 1.22 mg/dL — ABNORMAL HIGH (ref 0.44–1.00)
GFR, Estimated: 46 mL/min — ABNORMAL LOW (ref >60)
Glucose, Bld: 105 mg/dL — ABNORMAL HIGH (ref 70–99)
Potassium: 4 mmol/L (ref 3.5–5.1)
Sodium: 138 mmol/L (ref 135–145)
Total Bilirubin: 0.8 mg/dL (ref 0.3–1.2)
Total Protein: 7.5 g/dL (ref 6.5–8.1)

## 2022-03-30 LAB — CBC
HCT: 36.3 % (ref 36.0–46.0)
Hemoglobin: 11.6 g/dL — ABNORMAL LOW (ref 12.0–15.0)
MCH: 29.9 pg (ref 26.0–34.0)
MCHC: 32 g/dL (ref 30.0–36.0)
MCV: 93.6 fL (ref 80.0–100.0)
Platelets: 225 10*3/uL (ref 150–400)
RBC: 3.88 MIL/uL (ref 3.87–5.11)
RDW: 12.8 % (ref 11.5–15.5)
WBC: 10.5 10*3/uL (ref 4.0–10.5)
nRBC: 0 % (ref 0.0–0.2)

## 2022-03-30 LAB — URINALYSIS, ROUTINE W REFLEX MICROSCOPIC
Bilirubin Urine: NEGATIVE
Glucose, UA: NEGATIVE mg/dL
Hgb urine dipstick: NEGATIVE
Ketones, ur: NEGATIVE mg/dL
Nitrite: NEGATIVE
Protein, ur: NEGATIVE mg/dL
Specific Gravity, Urine: 1.021 (ref 1.005–1.030)
pH: 5 (ref 5.0–8.0)

## 2022-03-30 LAB — PROTIME-INR
INR: 1.1 (ref 0.8–1.2)
Prothrombin Time: 14 seconds (ref 11.4–15.2)

## 2022-03-30 LAB — LACTIC ACID, PLASMA: Lactic Acid, Venous: 1.2 mmol/L (ref 0.5–1.9)

## 2022-03-30 LAB — GLUCOSE, CAPILLARY
Glucose-Capillary: 254 mg/dL — ABNORMAL HIGH (ref 70–99)
Glucose-Capillary: 191 mg/dL — ABNORMAL HIGH (ref 70–99)

## 2022-03-30 LAB — SURGICAL PCR SCREEN
MRSA, PCR: NEGATIVE
Staphylococcus aureus: NEGATIVE

## 2022-03-30 LAB — TYPE AND SCREEN
ABO/RH(D): O POS
Antibody Screen: NEGATIVE

## 2022-03-30 LAB — BRAIN NATRIURETIC PEPTIDE: B Natriuretic Peptide: 786.2 pg/mL — ABNORMAL HIGH (ref 0.0–100.0)

## 2022-03-30 LAB — SARS CORONAVIRUS 2 BY RT PCR: SARS Coronavirus 2 by RT PCR: NEGATIVE

## 2022-03-30 SURGERY — MITRAL VALVE REPAIR
Anesthesia: General

## 2022-03-30 MED ORDER — FENTANYL 2500MCG IN NS 250ML (10MCG/ML) PREMIX INFUSION
25.0000 ug/h | INTRAVENOUS | Status: DC
  Administered 2022-03-30: 25 ug/h via INTRAVENOUS
  Administered 2022-03-31: 200 ug/h via INTRAVENOUS
  Filled 2022-03-30 (×2): qty 250

## 2022-03-30 MED ORDER — AMIODARONE HCL IN DEXTROSE 360-4.14 MG/200ML-% IV SOLN
30.0000 mg/h | INTRAVENOUS | Status: DC
  Administered 2022-03-31 (×2): 30 mg/h via INTRAVENOUS
  Filled 2022-03-30 (×2): qty 200

## 2022-03-30 MED ORDER — PHENYLEPHRINE 80 MCG/ML (10ML) SYRINGE FOR IV PUSH (FOR BLOOD PRESSURE SUPPORT)
PREFILLED_SYRINGE | INTRAVENOUS | Status: DC | PRN
  Administered 2022-03-30 (×4): 160 ug via INTRAVENOUS
  Administered 2022-03-30: 80 ug via INTRAVENOUS
  Administered 2022-03-30 (×3): 160 ug via INTRAVENOUS

## 2022-03-30 MED ORDER — ROCURONIUM BROMIDE 10 MG/ML (PF) SYRINGE
PREFILLED_SYRINGE | INTRAVENOUS | Status: DC | PRN
  Administered 2022-03-30: 20 mg via INTRAVENOUS
  Administered 2022-03-30: 40 mg via INTRAVENOUS

## 2022-03-30 MED ORDER — FENTANYL CITRATE (PF) 100 MCG/2ML IJ SOLN
INTRAMUSCULAR | Status: DC | PRN
  Administered 2022-03-30 (×2): 50 ug via INTRAVENOUS

## 2022-03-30 MED ORDER — VASOPRESSIN 20 UNIT/ML IV SOLN
INTRAVENOUS | Status: DC | PRN
  Administered 2022-03-30 (×2): 1 [IU] via INTRAVENOUS

## 2022-03-30 MED ORDER — MUPIROCIN 2 % EX OINT
TOPICAL_OINTMENT | CUTANEOUS | Status: AC
  Filled 2022-03-30: qty 22

## 2022-03-30 MED ORDER — NOREPINEPHRINE 16 MG/250ML-% IV SOLN
0.0000 ug/min | INTRAVENOUS | Status: DC
  Administered 2022-03-30: 3 ug/min via INTRAVENOUS
  Filled 2022-03-30: qty 250

## 2022-03-30 MED ORDER — DEXMEDETOMIDINE HCL IN NACL 400 MCG/100ML IV SOLN
INTRAVENOUS | Status: DC | PRN
  Administered 2022-03-30: .5 ug/kg/h via INTRAVENOUS
  Administered 2022-03-30: 38.12 ug via INTRAVENOUS

## 2022-03-30 MED ORDER — DIGOXIN 125 MCG PO TABS
0.0625 mg | ORAL_TABLET | Freq: Every day | ORAL | Status: DC
  Administered 2022-03-30 – 2022-03-31 (×2): 0.0625 mg via ORAL
  Filled 2022-03-30 (×3): qty 1

## 2022-03-30 MED ORDER — AMIODARONE HCL 150 MG/3ML IV SOLN
INTRAVENOUS | Status: AC
  Filled 2022-03-30: qty 3

## 2022-03-30 MED ORDER — AMIODARONE IV BOLUS ONLY 150 MG/100ML
INTRAVENOUS | Status: DC | PRN
  Administered 2022-03-30 (×2): 150 mg via INTRAVENOUS

## 2022-03-30 MED ORDER — FUROSEMIDE 10 MG/ML IJ SOLN
80.0000 mg | Freq: Two times a day (BID) | INTRAMUSCULAR | Status: DC
  Administered 2022-03-31: 80 mg via INTRAVENOUS
  Filled 2022-03-30 (×2): qty 8

## 2022-03-30 MED ORDER — FENTANYL CITRATE PF 50 MCG/ML IJ SOSY: 25.0000 ug | PREFILLED_SYRINGE | Freq: Once | INTRAMUSCULAR | Status: DC

## 2022-03-30 MED ORDER — HEPARIN SODIUM (PORCINE) 1000 UNIT/ML IJ SOLN
INTRAMUSCULAR | Status: DC | PRN
  Administered 2022-03-30: 3000 [IU] via INTRAVENOUS

## 2022-03-30 MED ORDER — SODIUM CHLORIDE 0.9% FLUSH
3.0000 mL | Freq: Two times a day (BID) | INTRAVENOUS | Status: DC
  Administered 2022-03-31: 3 mL via INTRAVENOUS

## 2022-03-30 MED ORDER — PHENYLEPHRINE HCL-NACL 20-0.9 MG/250ML-% IV SOLN
INTRAVENOUS | Status: DC | PRN
  Administered 2022-03-30: 50 ug/min via INTRAVENOUS

## 2022-03-30 MED ORDER — SODIUM CHLORIDE 0.9% FLUSH
3.0000 mL | INTRAVENOUS | Status: DC | PRN
  Administered 2022-03-30: 3 mL via INTRAVENOUS

## 2022-03-30 MED ORDER — DEXMEDETOMIDINE HCL IN NACL 400 MCG/100ML IV SOLN
0.4000 ug/kg/h | INTRAVENOUS | Status: DC
  Administered 2022-03-30 – 2022-03-31 (×5): 1.2 ug/kg/h via INTRAVENOUS
  Filled 2022-03-30 (×5): qty 100

## 2022-03-30 MED ORDER — ONDANSETRON HCL 4 MG/2ML IJ SOLN
INTRAMUSCULAR | Status: DC | PRN
  Administered 2022-03-30: 4 mg via INTRAVENOUS

## 2022-03-30 MED ORDER — NOREPINEPHRINE BITARTRATE 1 MG/ML IV SOLN
INTRAVENOUS | Status: AC
  Filled 2022-03-30: qty 4

## 2022-03-30 MED ORDER — LIDOCAINE 2% (20 MG/ML) 5 ML SYRINGE
INTRAMUSCULAR | Status: DC | PRN
  Administered 2022-03-30: 40 mg via INTRAVENOUS

## 2022-03-30 MED ORDER — NITROGLYCERIN IN D5W 200-5 MCG/ML-% IV SOLN
0.0000 ug/min | INTRAVENOUS | Status: DC
  Administered 2022-03-30: 10 ug/min via INTRAVENOUS
  Filled 2022-03-30: qty 250

## 2022-03-30 MED ORDER — LACTATED RINGERS IV SOLN: INTRAVENOUS | Status: DC

## 2022-03-30 MED ORDER — AMIODARONE HCL IN DEXTROSE 360-4.14 MG/200ML-% IV SOLN
INTRAVENOUS | Status: DC | PRN
  Administered 2022-03-30: 60 mg/h via INTRAVENOUS

## 2022-03-30 MED ORDER — SPIRONOLACTONE 25 MG PO TABS
25.0000 mg | ORAL_TABLET | Freq: Every day | ORAL | Status: DC
  Administered 2022-03-31: 25 mg
  Filled 2022-03-30 (×2): qty 1

## 2022-03-30 MED ORDER — MELATONIN 3 MG PO TABS
3.0000 mg | ORAL_TABLET | Freq: Once | ORAL | Status: AC
  Administered 2022-03-31: 3 mg
  Filled 2022-03-30: qty 1

## 2022-03-30 MED ORDER — SODIUM CHLORIDE 0.9 % IV SOLN
2.0000 g | INTRAVENOUS | Status: DC
  Administered 2022-03-30: 2 g via INTRAVENOUS
  Filled 2022-03-30: qty 20

## 2022-03-30 MED ORDER — NOREPINEPHRINE 4 MG/250ML-% IV SOLN
INTRAVENOUS | Status: DC | PRN
  Administered 2022-03-30: 10 ug/min via INTRAVENOUS

## 2022-03-30 MED ORDER — PROPOFOL 10 MG/ML IV BOLUS
INTRAVENOUS | Status: DC | PRN
  Administered 2022-03-30: 50 mg via INTRAVENOUS
  Administered 2022-03-30: 30 mg via INTRAVENOUS
  Administered 2022-03-30: 20 mg via INTRAVENOUS

## 2022-03-30 MED ORDER — CHLORHEXIDINE GLUCONATE 0.12 % MT SOLN
OROMUCOSAL | Status: AC
  Filled 2022-03-30: qty 15

## 2022-03-30 MED ORDER — SODIUM CHLORIDE 0.9 % IV SOLN: INTRAVENOUS | Status: DC | PRN

## 2022-03-30 MED ORDER — CEFAZOLIN SODIUM-DEXTROSE 2-4 GM/100ML-% IV SOLN: 2.0000 g | INTRAVENOUS | Status: DC

## 2022-03-30 MED ORDER — INSULIN ASPART 100 UNIT/ML IJ SOLN
0.0000 [IU] | INTRAMUSCULAR | Status: DC
  Administered 2022-03-30 – 2022-03-31 (×2): 3 [IU] via SUBCUTANEOUS

## 2022-03-30 MED ORDER — SODIUM CHLORIDE 0.9 % IV SOLN: INTRAVENOUS | Status: DC

## 2022-03-30 MED ORDER — FAMOTIDINE IN NACL 20-0.9 MG/50ML-% IV SOLN
20.0000 mg | Freq: Two times a day (BID) | INTRAVENOUS | Status: DC
  Administered 2022-03-30: 20 mg via INTRAVENOUS
  Filled 2022-03-30: qty 50

## 2022-03-30 MED ORDER — AMIODARONE LOAD VIA INFUSION
150.0000 mg | Freq: Once | INTRAVENOUS | Status: DC
  Filled 2022-03-30: qty 83.34

## 2022-03-30 MED ORDER — CEFAZOLIN SODIUM-DEXTROSE 2-4 GM/100ML-% IV SOLN
INTRAVENOUS | Status: AC
  Filled 2022-03-30: qty 100

## 2022-03-30 MED ORDER — NOREPINEPHRINE 4 MG/250ML-% IV SOLN
INTRAVENOUS | Status: AC
  Filled 2022-03-30: qty 250

## 2022-03-30 MED ORDER — POLYETHYLENE GLYCOL 3350 17 G PO PACK: 17.0000 g | PACK | Freq: Every day | ORAL | Status: DC

## 2022-03-30 MED ORDER — FENTANYL BOLUS VIA INFUSION
25.0000 ug | INTRAVENOUS | Status: DC | PRN
  Administered 2022-03-31: 50 ug via INTRAVENOUS
  Administered 2022-03-31: 75 ug via INTRAVENOUS
  Administered 2022-03-31: 100 ug via INTRAVENOUS

## 2022-03-30 MED ORDER — AMIODARONE HCL IN DEXTROSE 360-4.14 MG/200ML-% IV SOLN
60.0000 mg/h | INTRAVENOUS | Status: AC
  Administered 2022-03-30 (×2): 60 mg/h via INTRAVENOUS
  Filled 2022-03-30: qty 200

## 2022-03-30 MED ORDER — ACETAMINOPHEN 325 MG PO TABS: 650.0000 mg | ORAL_TABLET | ORAL | Status: DC | PRN

## 2022-03-30 MED ORDER — SODIUM CHLORIDE 0.9 % IV SOLN: 250.0000 mL | INTRAVENOUS | Status: DC | PRN

## 2022-03-30 MED ORDER — HEPARIN (PORCINE) 25000 UT/250ML-% IV SOLN
900.0000 [IU]/h | INTRAVENOUS | Status: DC
  Administered 2022-03-30: 900 [IU]/h via INTRAVENOUS
  Filled 2022-03-30: qty 250

## 2022-03-30 SURGICAL SUPPLY — 19 items
BALLN IABP SENSA PLUS 7.5F 40C (BALLOONS) ×2
BALLOON IABP SENS PLUS 7.5F40C (BALLOONS) IMPLANT
CATH SWAN GANZ VIP 7.5F (CATHETERS) IMPLANT
CLOSURE PERCLOSE PROSTYLE (VASCULAR PRODUCTS) IMPLANT
KIT CV 3L 7FR 20CM SULFAFREE (SET/KITS/TRAYS/PACK) IMPLANT
KIT HEART LEFT (KITS) ×4 IMPLANT
KIT MICROPUNCTURE NIT STIFF (SHEATH) IMPLANT
KIT VERSACROSS LRG ACCESS (CATHETERS) IMPLANT
PACK CARDIAC CATHETERIZATION (CUSTOM PROCEDURE TRAY) ×2 IMPLANT
SHEATH DILAT COONS TAPER 22F (SHEATH) IMPLANT
SHEATH PINNACLE 8F 10CM (SHEATH) IMPLANT
SHEATH PINNACLE 9F 10CM (SHEATH) IMPLANT
SHEATH PROBE COVER 6X72 (BAG) ×2 IMPLANT
SLEEVE REPOSITIONING LENGTH 30 (MISCELLANEOUS) IMPLANT
STOPCOCK MORSE 400PSI 3WAY (MISCELLANEOUS) ×12 IMPLANT
TRANSDUCER W/STOPCOCK (MISCELLANEOUS) ×2 IMPLANT
TUBING ART PRESS 72  MALE/FEM (TUBING) ×2
TUBING ART PRESS 72 MALE/FEM (TUBING) ×2 IMPLANT
WIRE EMERALD 3MM-J .035X150CM (WIRE) IMPLANT

## 2022-03-30 NOTE — H&P (Addendum)
Advanced Heart Failure Team H&P    Primary Physician: [name], MD PCP-Cardiologist:  Julien Nordmann, MD [facility]: Dr. Gala Romney    Reason for Admission: acute on chronic systolic heart failure>>cardiogenic shock    HPI:     Ashley [name] is seen today for evaluation of acute on chronic systolic heart failure>>cardiogenic shock, at the request of Dr. Excell Seltzer, Structural Heart.    Ashley [name] is a 76 y.o.with a history of morbid obesity, HTN, COPD, GERD , fibromyaligia, s/p partial colon resection for diverticulitis with re-connection, MGUS, LBBB and systolic HF due to NICM s/p CRT-D referred by Dr. Mariah Milling for further evaluation of her HF.   Has h/o NICM dating back to 2012 when she had PNA October 2012 ejection fraction  25-35%, September 2013 ejection fraction 30-35%.  EF in 2019 had recovered to 60-65% but now EF is back down to 15-20%.    Admitted 6/20 with increased dyspnea and chest pain. Prior to admit she had been treated with levaquin + prednisone for bronchitis. CXR was concerning for interstitial edema. Bedside ECHO performed in the ED showed severely reduced LVEF-->15% and severe MR. Placed on IV lasix and set up for cath. In 6/20 had RHC/LHC with normal coronaries, EF 20-25%, and mildly elevated filling pressures. Renal function has been stable. Underwent upgrade to CRT-D   Echo 10/21: EF 25%   She had COVID 7/21, started on Paxlovid. Subsequently developed diarrhea and was admitted for dehydration and AKI. Received IVFs and meds temporarily held but able to resume HF regimen. Also w/ hypoxia. D-dimer was elevated by chest CT negative for PE. Hypoxia felt 2/2 COVID and sent home w/ home O2.    Echo 8/22 showed EF 30%. RV normal.    Follow up 9/22, NYHA II-III, volume OK. Discussed possible CPX, but felt it would not change management.   Echo 5/23 EF 25-30%.   Admitted 8/23 with CAP and a/c CHF after stopping her diuretics. Diuresed with IV Bumex.  Transitioned to oral torsemide. Entresto 49/51 stopped. Discharged home, weight 148 lbs.   She was seen in the HF clinic 10/26 and was volume overloaded. Furoscix ordered. Seen back for return f/u on 10/30. Volume improved with SQ lasix but was still symptomatic w/ NYHA IIIB-IV symptoms, despite obtainment of euvolemia. Echo was done and showed EF 20% and severe MR. Felt to be nearing end-stage HF. We discussed MitraClip versus possible VAD and she was referred for further work-up.    She was set up for outpatient RHC and TEE 11/2. Studies showed severe MR and volume overload with low output, CI 1.8. She was admitted for VAD vs mTEER evals but patient ultimately decided she does not want VAD. Agreeable to TEER. Referred to Structural Heart Team. Seen by Dr. Excell Seltzer. Scheduled for MitraClip 11/16.   Seen for post hospital f/u 11/14. Continued w/ NYHA Class III-lllb symptoms. ReDs 35%. Device interrogation showed fluid index below threshold. No VT/VF. 98% Biv paced. BP 110/80.    Presented for today for MitraClip. During induction, developed  tachyarrhythmia (? Atrial tach) in the 200s and hypotension. Cardioverted and placed on amio gtt, NEO and NE. MitraClip canceled. Being transferred to [facility]. AHF team asked to assist w/ further management.      Echo Fatigue [ ] ; Fever [ ] ; Chills [ ] ; Weakness [ ]   Cardiac: Chest pain/pressure [ ] ; Resting SOB [ ] ; Exertional SOB [Y ]; Orthopnea [ ] ; Pedal Edema [ ] ; Palpitations [  Y]; Syncope [ ] ; Presyncope [ ] ; Paroxysmal nocturnal dyspnea[ ]   Pulmonary: Cough [ ] ; Wheezing[ ] ; Hemoptysis[ ] ; Sputum [ ] ; Snoring [ ]   GI: Vomiting[ ] ; Dysphagia[ ] ; Melena[ ] ; Hematochezia [ ] ; Heartburn[ ] ; Abdominal pain [ ] ; Constipation [ ] ; Diarrhea [ ] ; BRBPR [ ]   GU: Hematuria[ ] ; Dysuria [ ] ; Nocturia[ ]   Vascular: Pain in legs with walking [ ] ; Pain in feet with lying flat [ ] ; Non-healing sores [ ] ; Stroke [ ] ; TIA [ ] ; Slurred speech [ ] ;  Neuro: Headaches[ ] ; Vertigo[ ] ;  Seizures[ ] ; Paresthesias[ ] ;Blurred vision [ ] ; Diplopia [ ] ; Vision changes [ ]   Ortho/Skin: Arthritis [ ] ; Joint pain [ ] ; Muscle pain [ ] ; Joint swelling [ ] ; Back Pain [ ] ; Rash [ ]   Psych: Depression[ ] ; Anxiety[ ]   Heme: Bleeding problems [ ] ; Clotting disorders [ ] ; Anemia [ ]   Endocrine: Diabetes [ ] ; Thyroid dysfunction[ ]    Home Medications        Prior to Admission medications   Medication Sig Start Date End Date Taking? Authorizing Provider  acetaminophen (TYLENOL) 500 MG tablet Take 250 mg by mouth daily as needed for moderate pain or headache (PAIN).     Yes [provider]  albuterol (VENTOLIN HFA) 108 (90 Base) MCG/ACT inhaler Inhale 2 puffs into the lungs every 6 (six) hours as needed for wheezing or shortness of breath.     Yes [provider]  ALPRAZolam (XANAX) 0.25 MG tablet TAKE 1 TABLET BY MOUTH 2 TIMES DAILY AS NEEDED FOR ANXIETY. Patient taking differently: Take 0.125 mg by mouth 2 (two) times daily as needed for anxiety. 03/21/22   Yes [name], MD  aspirin EC 81 MG tablet Take 81 mg by mouth at bedtime.     Yes [provider]  beclomethasone (QVAR) 80 MCG/ACT inhaler Inhale 2 puffs into the lungs 2 (two) times daily. 03/04/21   Yes Parrett, Tammy S, NP  BENZONATATE PO Take by mouth daily as needed for cough.     Yes [provider]  calcium elemental as carbonate (TUMS ULTRA 1000) 400 MG chewable tablet Chew 1,000 mg by mouth daily as needed for heartburn.     Yes [provider]  Cholecalciferol (VITAMIN D3) 50 MCG (2000 UT) capsule Take 1 capsule (2,000 Units total) by mouth daily. 04/15/21   Yes [name], MD  Coenzyme Q10 200 MG capsule Take 200 mg by mouth daily.     Yes [provider]  dextromethorphan (DELSYM) 30 MG/5ML liquid Take 2.5 mLs by mouth daily as needed for cough.     Yes [provider]  docusate sodium (COLACE) 100 MG capsule Take 100 mg by mouth every morning.     Yes  [provider]  ezetimibe (ZETIA) 10 MG tablet TAKE 1 TABLET BY MOUTH EVERY DAY Patient taking differently: Take 10 mg by mouth at bedtime. 05/03/21   Yes Gollan, Tollie Pizza, MD  guaiFENesin (MUCINEX) 600 MG 12 hr tablet Take 1 tablet (600 mg total) by mouth 2 (two) times daily as needed for to loosen phlegm. Patient taking differently: Take 600 mg by mouth daily. 01/14/22   Yes [name], MD  levalbuterol [facility] HFA) 45 MCG/ACT inhaler Inhale 2 puffs into the lungs every 6 (six) hours as needed for wheezing or shortness of breath. Patient taking differently: Inhale 1-2 puffs into the lungs every 4 (four) hours as needed for wheezing or shortness of breath (Asthma).  09/15/21   Yes Furth, Cadence H, PA-C  Menthol, Topical Analgesic, (BIOFREEZE ROLL-ON EX) Apply 1 Application topically daily as needed (For shoulder pain).     Yes [provider]  miconazole (MICOTIN) 2 % powder Apply 1 application  topically daily as needed (yeast infection on stomach).     Yes [provider]  nystatin (MYCOSTATIN) 100000 UNIT/ML suspension TAKE 5 MLS (500,000 UNITS TOTAL) BY MOUTH 3 (THREE) TIMES DAILY. 03/16/22   Yes [name], MD  nystatin cream (MYCOSTATIN) Apply 1 application. topically as needed for dry skin (yeast infection). 10/14/21   Yes [name], MD  OXYGEN Inhale 2 L into the lungs continuous.     Yes [provider]  polyethylene glycol (MIRALAX / GLYCOLAX) 17 g packet Take 17 g by mouth daily as needed for mild constipation or moderate constipation.     Yes [provider]  potassium chloride 20 MEQ TBCR Take 20 mEq by mouth daily. 03/19/22   Yes Bhagat, Bhavinkumar, PA  sodium chloride (OCEAN) 0.65 % SOLN nasal spray Place 1 spray into both nostrils 2 (two) times daily.     Yes [provider]  spironolactone (ALDACTONE) 25 MG tablet Take 25 mg by mouth daily.     Yes [provider]  torsemide (DEMADEX) 20 MG tablet Take  1 tablet (20 mg total) by mouth daily. Take extra torsemide 10 mg with potassium as needed for weight >174 03/19/22   Yes Bhagat, Bhavinkumar, PA  VITAMIN E PO Take 1 tablet by mouth daily.     Yes [provider]  zolpidem (AMBIEN) 5 MG tablet Take 0.5-1 tablets (2.5-5 mg total) by mouth at bedtime as needed for sleep. Patient taking differently: Take 1.25 mg by mouth at bedtime as needed for sleep. 03/24/22   Yes [name], MD      Past Medical History:     Past Medical History:  Diagnosis Date   Abdominal aortic atherosclerosis (HCC) 11/2015    by CT   Allergy     Anemia     Anxiety     Automatic implantable cardioverter-defibrillator in situ      a. 2012 s/p MDT single lead ICD; b. 10/2018 BiV upgrade-->MDT ser # [id number] H.   Bronchiectasis      a. possibly mild, treat URIs aggressively   Cancer (HCC)      basal cell, arms & neck   Chronic sinusitis     COPD (chronic obstructive pulmonary disease) (HCC)     COVID-19 12/06/2020   Depression     Dyspnea     Essential hypertension      Pt denies   Family history of adverse reaction to anesthesia      Mother - PONV   Fibrocystic breast disease     Fibromyalgia     GERD (gastroesophageal reflux disease)     H/O multiple allergies 10/10/2013   Hemorrhoids     HFrEF (heart failure with reduced ejection fraction) (HCC)      a. 06/2011 cMRI: EF 34%, no LGE; b. 01/2016 Echo: EF 35-40%; c. 07/2016 Echo: EF 40-45%; d. 02/2020 Echo: EF 20-25%, glob HK. GrI DD. Mildly red RV fxn. Mildly dil LA. Triv MR.   History of diverticulitis of colon     History of pneumonia 2012   Insomnia     Irritable bowel syndrome     Laryngeal nodule      Dr. Thelma Comp   Migraine without aura, without mention of intractable migraine  without mention of status migrainosus     Mitral regurgitation     Monoclonal gammopathy     Neuropathy      feet and hands   Nonischemic cardiomyopathy (HCC)      a. 2012 s/p single lead ICD; b. 06/2011 cMRI: EF  34%, no LGE; b. 01/2016 Echo: EF 35-40%; c. 07/2016 Echo: EF 40-45%; d. 10/2018 Cath: nl cors. EF 20-25%; e. 10/2018 s/p BiV MDT ICD upgrade; f. 02/2020 Echo: EF 20-25%, glob HK. GrI DD.   Osteoarthritis      knees, back, hands, low back,    Osteoporosis 01/2013    T-2.7 spine (01/2016)   PONV (postoperative nausea and vomiting)     sigmoid diverticulitis with perforation, abscess and fistula 09/30/2013   Sleep apnea 2010    borderline, inconclusive- /w [employer]       Past Surgical History:      Past Surgical History:  Procedure Laterality Date   ABDOMINAL HYSTERECTOMY   1987    w/BSO   ANAL RECTAL MANOMETRY N/A 02/02/2016    Procedure: ANO RECTAL MANOMETRY;  Surgeon: Napoleon Form, MD;  Location: [facility];  Service: Endoscopy;  Laterality: N/A;   APPENDECTOMY       AUGMENTATION MAMMAPLASTY       BIV UPGRADE N/A 11/04/2018    Procedure: BIV ICD  UPGRADE;  Surgeon: Marinus Maw, MD;  [facility]: [facility] INVASIVE CV LAB;  Service: Cardiovascular;  Laterality: N/A;   breast cystectomies        due to FCBD   BREAST MASS EXCISION   01/2010    Left-fibrocystic change w/intraductal papilloma, no malignancy   carotid US   03/2015    1-39% LICA, normal RICA   CATARACT EXTRACTION W/PHACO Right 03/08/2021    Procedure: CATARACT EXTRACTION PHACO AND INTRAOCULAR LENS PLACEMENT (IOC) RIGHT;  Surgeon: Galen Manila, MD;  Location: [facility] SURGERY [facility];  Service: Ophthalmology;  Laterality: Right;  13.01 1:15.7   Chevron Bunionectomy   11/04/08    Right Great Toe (Dr. Lestine Box)   COLONOSCOPY   01/2018    1 small benign polyp, diverticulosis, healthy anastomosis Sheppard Penton @ [facility])   COLOSTOMY N/A 10/02/2013    DESCENDING COLOSTOMY;  Surgeon: Wilmon Arms. Tsuei, MD   COLOSTOMY REVERSAL N/A 02/10/2014    Manus Rudd, MD   EMG/MCV   10/16/01    + mild carpal tunnel   ESOPHAGOGASTRODUODENOSCOPY   09/2014    small HH, irregular Z line, fundic gland polyps with mild gastritis Juanda Chance)   Flex laryngoscopy    06/11/06    (Juengel) nml   IMPLANTABLE CARDIOVERTER DEFIBRILLATOR IMPLANT N/A 07/14/2011    MDT single chamber ICD   KNEE ARTHROSCOPY W/ ORIF        Left; "meniscus tear"   LASIK        left eye   MANDIBLE SURGERY        "got about 6 pins in the bottom of my jaw"   NCS   11/2014    L ulnar neuropathy, mild B median nerve entrapment (Ramos)   OSTEOTOMY        Left foot   PARTIAL COLECTOMY N/A 10/02/2013    PARTIAL COLECTOMY;  Surgeon: Wilmon Arms. Tsuei, MD   RIGHT/LEFT HEART CATH AND CORONARY ANGIOGRAPHY N/A 10/16/2018    Procedure: RIGHT/LEFT HEART CATH AND CORONARY ANGIOGRAPHY;  Surgeon: Iran Ouch, MD;  Location: [facility] INVASIVE CV LAB;  Service: Cardiovascular;  Laterality: N/A;   RIGHT/LEFT HEART CATH AND  CORONARY ANGIOGRAPHY N/A 03/16/2022    Procedure: RIGHT/LEFT HEART CATH AND CORONARY ANGIOGRAPHY;  Surgeon: Dolores Patty, MD;  Location: [facility] INVASIVE CV LAB;  Service: Cardiovascular;  Laterality: N/A;   sinus surgery        x3 with balloon    TEE WITHOUT CARDIOVERSION N/A 03/16/2022    Procedure: TRANSESOPHAGEAL ECHOCARDIOGRAM (TEE);  Surgeon: Dolores Patty, MD;  Location: [facility] ENDOSCOPY;  Service: Cardiovascular;  Laterality: N/A;   TOE AMPUTATION        "toe beside baby toe on left foot; got gangrene from corn"   TONSILLECTOMY AND ADENOIDECTOMY          Family History:      Family History  Problem Relation Age of Onset   Lung cancer Father          + smoker   Colon polyps Father     Dementia Mother     Osteoporosis Mother          Lumbar spine   Stroke Mother          x 5 @ 46 YOA   Arthritis Mother          hands   Emphysema Mother     Alcohol abuse Paternal Grandfather     Stroke Paternal Grandfather          ?   Heart disease Paternal Grandfather     Hypertension Maternal Grandfather          ?   Heart disease Paternal Grandmother     Colon cancer Neg Hx     Esophageal cancer Neg Hx     Gallbladder disease Neg Hx        Social History: Social  History         Socioeconomic History   Marital status: Married      Spouse name: Not on file   Number of children: 1   Years of education: Not on file   Highest education level: Not on file  Occupational History   Occupation: Database administrator   Occupation: Technical sales engineer  Tobacco Use   Smoking status: Never   Smokeless tobacco: Never   Tobacco comments:      Lived with smokers x 23 yrs, smoked herself "for a week"  Vaping Use   Vaping Use: Never used  Substance and Sexual Activity   Alcohol use: No      Alcohol/week: 0.0 standard drinks of alcohol   Drug use: No   Sexual activity: Not Currently  Other Topics Concern   Not on file  Social History Narrative    Lives with husband Molly Maduro    1 adopted child    Social Determinants of Health        Financial Resource Strain: Medium Risk (05/24/2021)    Overall Financial Resource Strain (CARDIA)     Difficulty of Paying Living Expenses: Somewhat hard  Food Insecurity: No Food Insecurity (03/18/2022)    Hunger Vital Sign     Worried About Running Out of Food in the Last Year: Never true     Ran Out of Food in the Last Year: Never true  Transportation Needs: Unmet Transportation Needs (03/18/2022)    PRAPARE - Therapist, art (Medical): Yes     Lack of Transportation (Non-Medical): No  Physical Activity: Insufficiently Active (12/01/2019)    Exercise Vital Sign     Days of Exercise per Week: 3 days  Minutes of Exercise per Session: 30 min  Stress: Stress Concern Present (12/01/2019)    [employer] of Occupational Health - Occupational Stress Questionnaire     Feeling of Stress : Rather much  Social Connections: Not on file      Allergies:       Allergies  Allergen Reactions   Chlorhexidine Other (See Comments)      blisters   Codeine Other (See Comments)      Confusion and dizziness Makes pain level worse   Cyclobenzaprine Other (See Comments)       Adverse reaction - back and throat pain, dizziness, exhaustion   Cymbalta [Duloxetine Hcl] Other (See Comments)      "body spasms and made me feel weird in my head"    Diflucan [Fluconazole] Nausea And Vomiting   Dilaudid [Hydromorphone Hcl] Other (See Comments)      Feels like she is burning internally    Influenza Vac Split [Influenza Virus Vaccine] Other (See Comments)      "allergic to concentrated eggs that are in vaccine"   Lasix [Furosemide] Other (See Comments)      Severe hypotension on lasix 40 PO (can handle 20 mg oral dosing) 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  Can take a different amount   Latex Nausea Only, Rash and Other (See Comments)      Pain and infection   Lipitor [Atorvastatin] Other (See Comments)      Severe muscle cramps to generic atorvastatin.  Able to take brand lipitor.   Morphine And Related Other (See Comments)      Internally feels like she is on fire    Pravastatin Other (See Comments)      myalgias   Prevacid [Lansoprazole] Other (See Comments)      Worsened GI side effects   Prevnar [Pneumococcal 13-Val Conj Vacc] Other (See Comments)      Egg allergy Bad reaction after shot   Rosuvastatin Other (See Comments)      Was not effective controlling lipids   Simvastatin Other (See Comments)      Leg cramps   Tape Other (See Comments) and Rash      All adhesives  - Blisters, itching and burning   blistering   Farxiga [Dapagliflozin] Other (See Comments)      Unknown reaction    Metaxalone Other (See Comments)      Unknown reaction    Tramadol        Leg cramps   Antihistamines, Chlorpheniramine-Type Other (See Comments)      Alters vision   Betadine [Povidone Iodine] Rash      Per pt.    Onion Diarrhea and Other (See Comments)      GI distress, flared IBS   Other Hives and Rash      NOTE: pt is able to take cephalosporins without reaction   Penicillins Other (See Comments)      CHILDHOOD ALLEGY/REACTION: "told 50 years ago I couldn't take it; might be  immune to it", able to take amoxicillin Has patient had a PCN reaction causing immediate rash, facial/tongue/throat swelling, SOB or lightheadedness with hypotension: Unknown Has patient had a PCN reaction causing severe rash involving mucus membranes or skin necrosis: UnknowN Has patient had a PCN reaction that required hospitalization: /Unknown Has patient had a PCN reaction occurring within the last 10 years: Unknown If a   Red Dye Nausea Only and Swelling   Sertraline Other (See Comments)      Overly sedating  Sulfasalazine Other (See Comments)      "makes my whole body smell like sulfa & makes me sick just smelling it"      Objective:     Vital Signs:                 Temp:  [98.3 F (36.8 C)] 98.3 F (36.8 C) (11/16 1102) Pulse Rate:  [106] 106 (11/16 1102) Resp:  [16] 16 (11/16 1102) BP: (109)/(72) 109/72 (11/16 1102) SpO2:  [98 %] 98 % (11/16 1102) Weight:  [76.2 kg] 76.2 kg (11/16 1102)   Weight change:    Filed Weights    04/02/2022 1102  Weight: 76.2 kg      Intake/Output:            No intake or output data in the 24 hours ending 03/27/2022 1526            Physical Exam    General:  intubated  HEENT: normal + ETT  Neck: supple. JVP elevated . Carotids 2+ bilat; no bruits. No lymphadenopathy or thyromegaly appreciated. Cor: PMI nondisplaced. Regular rate & rhythm. 3/6 MR  Lungs: clear Abdomen: soft, nontender, nondistended. No hepatosplenomegaly. No bruits or masses. Good bowel sounds. Extremities: no cyanosis, clubbing, rash, trace b/l LE edema Neuro: intubated and sedated      Telemetry    Atrial tach 200s s/p DCCV>>NSR    EKG    12 lead pending   Labs    Basic Metabolic Panel: Last Labs      Recent Labs  Lab 03/27/22 1547 03/23/2022 1137  NA 139 138  K 4.3 4.0  CL 105 106  CO2 22 20*  GLUCOSE 91 105*  BUN 22 22  CREATININE 1.03* 1.22*  CALCIUM 10.1 9.9        Liver Function Tests: Last Labs     Recent Labs  Lab 03/22/2022 1137   AST 18  ALT 17  ALKPHOS 103  BILITOT 0.8  PROT 7.5  ALBUMIN 3.9      Last Labs  No results for input(s): "LIPASE", "AMYLASE" in the last 168 hours.   Last Labs  No results for input(s): "AMMONIA" in the last 168 hours.     CBC: Last Labs     Recent Labs  Lab 03/27/2022 1137  WBC 10.5  HGB 11.6*  HCT 36.3  MCV 93.6  PLT 225        Cardiac Enzymes: Last Labs  No results for input(s): "CKTOTAL", "CKMB", "CKMBINDEX", "TROPONINI" in the last 168 hours.     BNP: BNP (last 3 results) Recent Labs (within last 365 days)       Recent Labs    03/13/22 0951 03/16/22 1703 03/17/2022 1137  BNP 710.8* 1,021.1* 786.2*        ProBNP (last 3 results) Recent Labs (within last 365 days)  No results for input(s): "PROBNP" in the last 8760 hours.       CBG: Last Labs  No results for input(s): "GLUCAP" in the last 168 hours.     Coagulation Studies: Recent Labs (last 2 labs)     Recent Labs    04/03/2022 1137  LABPROT 14.0  INR 1.1          Imaging    No results found.     Medications:       Current Medications:  chlorhexidine            Infusions:  sodium chloride     ceFAZolin  ceFAZolin (ANCEF) IV     lactated ringers 10 mL/hr at 06-Apr-2022 1428          Patient Profile    76 y/o female w/ chronic systolic heart failure due to [facility], s/p CRT-D, most recent EF 15%, severe functional MR, recently declined LVAD. Presented for elective MitraClip. During induction developed atrial tachycardia in 200s, hypotension/ shock, requiring emergent cardioversion, initiation of amio gtt, NEO and NE. MitraClip aborted. AHF team consulted for shock.    Assessment/Plan    1. Acute on Chronic Systolic Heart Failure>>Shock  - h/o NICM dating back to 2012 with fluctuating EF. EF improved to 60% in 2019 but then dropped again  - Echo 6/20 EF 20%  - L/RHC 6/20 with normal coronaries and mildly elevated filling pressures.  - Underwent CRT-D upgrade 6/20 but EF  has not improved - Echo 10/21 EF 25% - Echo 8/22 EF 30%, RV normal  - Echo 5/23 EF 25-30%. - Echo 02/2022 EF 20-25% Severe MR - [facility] 11/23 Normal coronaries with anomalous RCA, Severe LV dysfunction EF 15% due to NICM, Markedly elevated filling pressures with prominent v-waves in PCWP tracing c/w known severe MR. CI 1.8. Pt decided she does not want LVAD. - Presented for MitraClip 11/16.  Developed atrial tach, hypotension/ shock during induction. Procedure aborted  - Support BP w/ NE. Will try to wean off NEO - Place Swan to follow hemodynamics. May need inotropic or IABP support - rhythm control w/ amio gtt  - set up CVP for monitoring of volume status. Diurese as needed  - holding GDMT w/ hypotension/shock    2. Severe MR - Functional MR - TEE 11/23 showed dilated annulus. Restricted P2. Severe central MR with several jets over P2 and P1. PISA 0.8cm. No stenosis.  - Postponing MitraClip until stabilized from shock standpoint  - Plan RHC w/ swan placement. May need milrinone for inotropic support and afterload reduction    3. Atrial Tachycardia - s/p emergent DCCV in cath lab>>NSR  - continue amio gtt at 60 hr - keep K > 4.0 and Mg > 2.0    Length of Stay: 0   Venetta Knee, PA-C  Apr 06, 2022, 3:26 PM   Advanced Heart Failure Team Pager [phone] (M-F; 7a - 5p)  Please contact [facility] for night-coverage after hours (4p -7a ) and weekends on [email or web address]

## 2022-03-30 NOTE — Progress Notes (Signed)
ANTICOAGULATION CONSULT NOTE - Initial Consult  Pharmacy Consult for heparin Indication:  IABP  Allergies  Allergen Reactions   Chlorhexidine Other (See Comments)    blisters   Codeine Other (See Comments)    Confusion and dizziness Makes pain level worse   Cyclobenzaprine Other (See Comments)    Adverse reaction - back and throat pain, dizziness, exhaustion   Cymbalta [Duloxetine Hcl] Other (See Comments)    "body spasms and made me feel weird in my head"    Diflucan [Fluconazole] Nausea And Vomiting   Dilaudid [Hydromorphone Hcl] Other (See Comments)    Feels like she is burning internally    Influenza Vac Split [Influenza Virus Vaccine] Other (See Comments)    "allergic to concentrated eggs that are in vaccine"   Lasix [Furosemide] Other (See Comments)    Severe hypotension on lasix 40 PO (can handle 20 mg oral dosing) 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  Can take a different amount   Latex Nausea Only, Rash and Other (See Comments)    Pain and infection   Lipitor [Atorvastatin] Other (See Comments)    Severe muscle cramps to generic atorvastatin.  Able to take brand lipitor.   Morphine And Related Other (See Comments)    Internally feels like she is on fire    Pravastatin Other (See Comments)    myalgias   Prevacid [Lansoprazole] Other (See Comments)    Worsened GI side effects   Prevnar [Pneumococcal 13-Val Conj Vacc] Other (See Comments)    Egg allergy Bad reaction after shot   Rosuvastatin Other (See Comments)    Was not effective controlling lipids   Simvastatin Other (See Comments)    Leg cramps   Tape Other (See Comments) and Rash    All adhesives  - Blisters, itching and burning  blistering   Farxiga [Dapagliflozin] Other (See Comments)    Unknown reaction    Metaxalone Other (See Comments)    Unknown reaction    Tramadol     Leg cramps   Antihistamines, Chlorpheniramine-Type Other (See Comments)    Alters vision   Betadine [Povidone Iodine] Rash    Per pt.     Onion Diarrhea and Other (See Comments)    GI distress, flared IBS   Other Hives and Rash    NOTE: pt is able to take cephalosporins without reaction   Penicillins Other (See Comments)    CHILDHOOD ALLEGY/REACTION: "told 50 years ago I couldn't take it; might be immune to it", able to take amoxicillin Has patient had a PCN reaction causing immediate rash, facial/tongue/throat swelling, SOB or lightheadedness with hypotension: Unknown Has patient had a PCN reaction causing severe rash involving mucus membranes or skin necrosis: UnknowN Has patient had a PCN reaction that required hospitalization: /Unknown Has patient had a PCN reaction occurring within the last 10 years: Unknown If a   Red Dye Nausea Only and Swelling   Sertraline Other (See Comments)    Overly sedating   Sulfasalazine Other (See Comments)    "makes my whole body smell like sulfa & makes me sick just smelling it"    Patient Measurements: Height: '5\' 5"'$  (165.1 cm) Weight: 76.2 kg (168 lb) IBW/kg (Calculated) : 57 Heparin Dosing Weight: 73kg  Vital Signs: Temp: 98.3 F (36.8 C) (11/16 1102) Temp Source: Oral (11/16 1102) BP: 107/71 (11/16 1604) Pulse Rate: 120 (11/16 1658)  Labs: Recent Labs    03/25/2022 1137 04/08/2022 1534 04/01/2022 1538 03/15/2022 1604  HGB 11.6* 10.2* 11.9*  11.9* 11.9*  HCT 36.3 30.0* 35.0*  35.0* 35.0*  PLT 225  --   --   --   LABPROT 14.0  --   --   --   INR 1.1  --   --   --   CREATININE 1.22*  --   --   --     Estimated Creatinine Clearance: 40.1 mL/min (A) (by C-G formula based on SCr of 1.22 mg/dL (H)).   Medical History: Past Medical History:  Diagnosis Date   Abdominal aortic atherosclerosis ([place]) 11/2015   by CT   Allergy    Anemia    Anxiety    Automatic implantable cardioverter-defibrillator in situ    a. 2012 s/p MDT single lead ICD; b. 10/2018 BiV upgrade-->MDT ser # [id number] H.   Bronchiectasis    a. possibly mild, treat URIs aggressively   Cancer ([place])    basal  cell, arms & neck   Chronic sinusitis    COPD (chronic obstructive pulmonary disease) ([place])    COVID-19 12/06/2020   Depression    Dyspnea    Essential hypertension    Pt denies   Family history of adverse reaction to anesthesia    Mother - PONV   Fibrocystic breast disease    Fibromyalgia    GERD (gastroesophageal reflux disease)    H/O multiple allergies 10/10/2013   Hemorrhoids    HFrEF (heart failure with reduced ejection fraction) ([place])    a. 06/2011 cMRI: EF 34%, no LGE; b. 01/2016 Echo: EF 35-40%; c. 07/2016 Echo: EF 40-45%; d. 02/2020 Echo: EF 20-25%, glob HK. GrI DD. Mildly red RV fxn. Mildly dil LA. Triv MR.   History of diverticulitis of colon    History of pneumonia 2012   Insomnia    Irritable bowel syndrome    Laryngeal nodule    Dr. Thomasena Edis   Migraine without aura, without mention of intractable migraine without mention of status migrainosus    Mitral regurgitation    Monoclonal gammopathy    Neuropathy    feet and hands   Nonischemic cardiomyopathy ([place])    a. 2012 s/p single lead ICD; b. 06/2011 cMRI: EF 34%, no LGE; b. 01/2016 Echo: EF 35-40%; c. 07/2016 Echo: EF 40-45%; d. 10/2018 Cath: nl cors. EF 20-25%; e. 10/2018 s/p BiV MDT ICD upgrade; f. 02/2020 Echo: EF 20-25%, glob HK. GrI DD.   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 Hemoglobin appears stable in 11s, plt within normal limits.   Orders for IV heparin for balloon bump.   Goal of Therapy:  Heparin level 0.2-0.5 units/ml Monitor platelets by anticoagulation protocol: Yes   Plan:  Start heparin infusion at 900  units/hr Check anti-Xa level in 8 hours and daily while on heparin Continue to monitor H&H and platelets  Erin Hearing PharmD., BCPS Clinical Pharmacist 03/20/2022 5:51 PM

## 2022-03-30 NOTE — Progress Notes (Signed)
eLink Physician-Brief Progress Note Patient Name: MKAYLA STEELE DOB: Dec 23, 1945 MRN: [id number]   Date of Service  03/15/2022  HPI/Events of Note  Nursing request for a sleep aid.   eICU Interventions  Plan: Melatonin 3 mg per tube X 1.      Intervention Category Major Interventions: Delirium, psychosis, severe agitation - evaluation and management  Takiya Belmares [place] 04/02/2022, 11:33 PM

## 2022-03-30 NOTE — Anesthesia Procedure Notes (Signed)
Arterial Line Insertion Start/End11/16/2023 2:00 PM Performed by: Kyung Rudd, CRNA, CRNA  Patient location: Pre-op. Preanesthetic checklist: patient identified, IV checked, risks and benefits discussed, pre-op evaluation and timeout performed Lidocaine 1% used for infiltration Right, radial was placed Catheter size: 20 G Hand hygiene performed   Attempts: 2 Procedure performed without using ultrasound guided technique. Following insertion, dressing applied and Biopatch. Patient tolerated the procedure well with no immediate complications. Additional procedure comments: Prep with alcohol.

## 2022-03-30 NOTE — Transfer of Care (Signed)
Immediate Anesthesia Transfer of Care Note  Patient: Ashley [name]  Procedure(s) Performed: MITRAL VALVE REPAIR TRANSESOPHAGEAL ECHOCARDIOGRAM (TEE) CENTRAL LINE INSERTION IABP Insertion RIGHT HEART CATH  Patient Location: ICU  Anesthesia Type:General  Level of Consciousness: Patient remains intubated per anesthesia plan  Airway & Oxygen Therapy: Patient placed on Ventilator (see vital sign flow sheet for setting)  Post-op Assessment: Report given to RN and Post -op Vital signs reviewed and stable  Post vital signs: Reviewed and stable  Last Vitals:  Vitals Value Taken Time  BP 167/50 (aline) 04/02/2022 1705  Temp    Pulse 106 03/19/2022 1705  Resp 20 04/06/2022 1705  SpO2 91 % 04/03/2022 1705  Vitals shown include unvalidated device data.  Last Pain:  Vitals:   04/02/2022 1223  TempSrc:   PainSc: 0-No pain         Complications: There were no known notable events for this encounter.

## 2022-03-30 NOTE — Interval H&P Note (Signed)
History and Physical Interval Note:  04/03/2022 2:15 PM  Ashley [name]  has presented today for surgery, with the diagnosis of severe mitral insufficiency.  The various methods of treatment have been discussed with the patient and family. After consideration of risks, benefits and other options for treatment, the patient has consented to  Procedure(s): MITRAL VALVE REPAIR (N/A) TRANSESOPHAGEAL ECHOCARDIOGRAM (TEE) (N/A) as a surgical intervention.  The patient's history has been reviewed, patient examined, no change in status, stable for surgery.  I have reviewed the patient's chart and labs.  Questions were answered to the patient's satisfaction.     Sherren Mocha

## 2022-03-30 NOTE — Progress Notes (Signed)
Called [employer] for representative [place] to be paged for patient's procedure at 1345.

## 2022-03-30 NOTE — Anesthesia Procedure Notes (Signed)
Procedure Name: Intubation Date/Time: 04/08/2022 2:54 PM  Performed by: Janene Harvey, CRNAPre-anesthesia Checklist: Patient identified, Emergency Drugs available, Suction available and Patient being monitored Patient Re-evaluated:Patient Re-evaluated prior to induction Oxygen Delivery Method: Circle system utilized Preoxygenation: Pre-oxygenation with 100% oxygen Induction Type: IV induction Ventilation: Mask ventilation without difficulty and Oral airway inserted - appropriate to patient size Laryngoscope Size: Glidescope and 3 Grade View: Grade II Tube type: Oral Tube size: 7.0 mm Number of attempts: 1 Airway Equipment and Method: Stylet and Oral airway Placement Confirmation: ETT inserted through vocal cords under direct vision, positive ETCO2 and breath sounds checked- equal and bilateral Secured at: 21 cm Tube secured with: Tape Dental Injury: Teeth and Oropharynx as per pre-operative assessment  Comments: DL with MAC 4, grade III view, unable to pass ett. Pt masked with oral airway. Glidescope 3 DL with grade II view, 7.0 ett easily passed

## 2022-03-30 NOTE — Consult Note (Deleted)
  Entered in error. See H&P note

## 2022-03-30 NOTE — Progress Notes (Signed)
STRUCTURAL HEART NOTE The patient arrives as an outpatient for MitraClip procedure.  She is evaluated in the preoperative area and is clinically stable.  She has been consented for the procedure.  Her H&P is updated.  She is transferred to the hybrid Cath Lab for the procedure.  When the patient is induced for anesthesia, she develops wide-complex tachycardia initially at a rate of about 150 bpm but then her rate accelerates all the way up to 200 bpm associated with hypotension.  She is emergently cardioverted and converts to a paced rhythm at about 140 bpm.  Her device is interrogated and she is initially demonstrated to be in atrial tachycardia and then her rate decreases to about 110 bpm and sinus tachycardia.  She requires Neo-Synephrine initially for blood pressure support.  The advanced heart failure team is called as they have followed her closely.  We decided to not proceed with MitraClip as she was not hemodynamically stable for this.  The patient underwent placement of a right internal jugular Swan-Ganz catheter and intra-aortic balloon pump by Dr. Salomon Mast  Please see his note for details.  She will be admitted to the CV-ICU and cared for by the advanced heart failure team.  I spoke to her husband and her son and explained the situation to them.  Their questions are answered.   Sherren Mocha 03/23/2022 4:59 PM

## 2022-03-30 NOTE — Consult Note (Signed)
NAME:  Ashley [name], MRN:  [id number], DOB:  Nov 21, 1945, LOS: 0 ADMISSION DATE:  04/05/2022 CONSULTATION DATE:  04/09/2022 REFERRING MD:  Bensimhon - HF CHIEF COMPLAINT:  Severe MR, aborted MitraClip procedure, vent management  History of Present Illness:  76 year old woman who presented to [facility] 11/16 for MitraClip procedure for definitive management of severe MR. PMHx significant for HTN, HFrEF (Echo 11/2 LVEF < 20%, severely dilated LA, severe MR), NICM s/p AICD placement, COPD with bronchiectasis, OSA, MGUS, GERD, IBS, diverticulitis, anxiety/depression.  Patient presented to [facility] for planned MitraClip procedure 11/16 and was transferred to cath lab. On induction, patient developed wide-complex tachycardia (rates 150-200) with associated hypotension; she was emergently cardioverted with conversion to paced rhythm (rate 140). Device was interrogated demonstrating AT with rate 110. Neo-Synephrine was initiated for BP support. Procedure was aborted due to hemodynamic instability and AHF team was consulted. R IJ Swan and IABP were placed by Dr. Daniel Nones.  PCCM consulted for vent management.  Pertinent Medical History:   Past Medical History:  Diagnosis Date   Abdominal aortic atherosclerosis ([place]) 11/2015   by CT   Allergy    Anemia    Anxiety    Automatic implantable cardioverter-defibrillator in situ    a. 2012 s/p MDT single lead ICD; b. 10/2018 BiV upgrade-->MDT ser # [id number] H.   Bronchiectasis    a. possibly mild, treat URIs aggressively   Cancer ([place])    basal cell, arms & neck   Chronic sinusitis    COPD (chronic obstructive pulmonary disease) ([place])    COVID-19 12/06/2020   Depression    Dyspnea    Essential hypertension    Pt denies   Family history of adverse reaction to anesthesia    Mother - PONV   Fibrocystic breast disease    Fibromyalgia    GERD (gastroesophageal reflux disease)    H/O multiple allergies 10/10/2013   Hemorrhoids    HFrEF (heart failure  with reduced ejection fraction) ([place])    a. 06/2011 cMRI: EF 34%, no LGE; b. 01/2016 Echo: EF 35-40%; c. 07/2016 Echo: EF 40-45%; d. 02/2020 Echo: EF 20-25%, glob HK. GrI DD. Mildly red RV fxn. Mildly dil LA. Triv MR.   History of diverticulitis of colon    History of pneumonia 2012   Insomnia    Irritable bowel syndrome    Laryngeal nodule    Dr. Thomasena Edis   Migraine without aura, without mention of intractable migraine without mention of status migrainosus    Mitral regurgitation    Monoclonal gammopathy    Neuropathy    feet and hands   Nonischemic cardiomyopathy ([place])    a. 2012 s/p single lead ICD; b. 06/2011 cMRI: EF 34%, no LGE; b. 01/2016 Echo: EF 35-40%; c. 07/2016 Echo: EF 40-45%; d. 10/2018 Cath: nl cors. EF 20-25%; e. 10/2018 s/p BiV MDT ICD upgrade; f. 02/2020 Echo: EF 20-25%, glob HK. GrI DD.   Osteoarthritis    knees, back, hands, low back,    Osteoporosis 01/2013   T-2.7 spine (01/2016)   PONV (postoperative nausea and vomiting)    sigmoid diverticulitis with perforation, abscess and fistula 09/30/2013   Sleep apnea 2010   borderline, inconclusive- /w [facility] Hospital Events: Including procedures, antibiotic start and stop dates in addition to other pertinent events   11/16 - Admitted for MitraClip procedure. On induction, wide-complex tachycardic with rates 150s-200s and hypotension requiring cardioversion; converted to paced rhythm 140. Procedure aborted due to hemodynamic instability. Luiz Blare +  IABP placed by AHF. Transferred to [facility] for further care. PCCM consulted.  Interim History / Subjective:  PCCM consulted as above  Objective:  Blood pressure (!) 113/48, pulse (!) 120, temperature 98.3 F (36.8 C), temperature source Oral, resp. rate (!) 21, height '5\' 5"'$  (1.651 m), weight 76.2 kg, SpO2 97 %.    Vent Mode: PRVC FiO2 (%):  [80 %-100 %] 80 % Set Rate:  [18 bmp-22 bmp] 22 bmp Vt Set:  [460 mL] 460 mL PEEP:  [8 cmH20-10 cmH20] 10 cmH20 Plateau Pressure:   [19 cmH20-21 cmH20] 19 cmH20   Intake/Output Summary (Last 24 hours) at 04/10/2022 1809 Last data filed at 04/02/2022 1624 Gross per 24 hour  Intake 1000 ml  Output --  Net 1000 ml   Filed Weights   03/20/2022 1102  Weight: 76.2 kg   Physical Examination: General: Acutely ill-appearing elderly woman in NAD. Appears mildly uncomfortable. HEENT: [place]/AT, anicteric sclera, PERRL 2.74m, moist mucous membranes. ETT in place. Neuro:  Awake, nodding appropriately to questions.  Responds to verbal stimuli. Following commands consistently. Moves all 4 extremities spontaneously. +Cough and +Gag  CV: RRR, +loud systolic murmur, +mechanical sounds c/w IABP. PULM: Breathing even and unlabored on vent (PEEP 10, FiO2 100%). Lung fields CTAB. GI: Soft, nontender, nondistended. Hypoactive bowel sounds. Extremities: Bilateral symmetric 1+ LE edema noted. R groin access site c/d/I without erythema/drainage. Skin: Cool/dry, no rashes.  Resolved Hospital Problem List:    Assessment & Plan:   Cardiogenic shock Acute-on-chronic systolic HF S/p IAPB placement 11/16 History of HFrEF Echo 03/16/2022 with EF < 20%, severely decreased LV function, RV systolic function moderately reduced, LA severely dilated, no LAA thrombus, dilated mitral annulus with severe MR., trivial AR. - Admit to CVICU for hemodynamic monitoring - AHF team primary - Goal MAP > 65 - Trend LA - Diuresis per HF team, Lasix '80mg'$  BID, Aldactone - Heparin gtt per pharmacy - Levophed titrated to goal MAP, weaned off of Neo - Monitor CI, CO on Swan, CVP  Severe mitral valve regurgitation Aborted MitraClip procedure 11/16 Atrial tachycardia NICM s/p AICD Demonstrated on Echo 11/2. Taken for MitraClip with Dr. CBurt Knack procedure aborted due to hemodynamic instability on induction. - Postponing procedure until more hemodynamically stable/shock resolved - May require milrinone, will defer to AHF team - Continue amio gtt, digoxin -  Optimize electrolytes  Acute hypoxemic respiratory failure, periprocedural COPD with bronchiectasis OSA - Continue full vent support (4-8cc/kg IBW) - Wean FiO2 for O2 sat > 90% - Daily WUA/SBT as mental status/hemodynamics tolerate - Bronchodilators as indicated - VAP bundle - Pulmonary hygiene - PAD protocol for sedation: Precedex and Fentanyl for goal RASS 0 to -1 - F/u CXR, ABG  AKI on CKD in the setting of hemodynamic compromise - Trend BMP - Replete electrolytes as indicated, goal K > 4, Mg  > 2 - Monitor I&Os - Avoid nephrotoxic agents as able  Best Practice: (right click and "Reselect all SmartList Selections" daily)   Per Primary Team  Labs:  CBC: Recent Labs  Lab 04/01/2022 1137 04/04/2022 1534 04/11/2022 1538 03/18/2022 1604  WBC 10.5  --   --   --   HGB 11.6* 10.2* 11.9*  11.9* 11.9*  HCT 36.3 30.0* 35.0*  35.0* 35.0*  MCV 93.6  --   --   --   PLT 225  --   --   --    Basic Metabolic Panel: Recent Labs  Lab 03/27/22 1547 03/27/2022 1137 03/17/2022 1534 03/29/2022 1538 03/19/2022  1604  NA 139 138 139 137  137 137  K 4.3 4.0 4.4 4.1  4.1 4.3  CL 105 106  --   --   --   CO2 22 20*  --   --   --   GLUCOSE 91 105*  --   --   --   BUN 22 22  --   --   --   CREATININE 1.03* 1.22*  --   --   --   CALCIUM 10.1 9.9  --   --   --    GFR: Estimated Creatinine Clearance: 40.1 mL/min (A) (by C-G formula based on SCr of 1.22 mg/dL (H)). Recent Labs  Lab 04/04/2022 1137  WBC 10.5   Liver Function Tests: Recent Labs  Lab 04/12/2022 1137  AST 18  ALT 17  ALKPHOS 103  BILITOT 0.8  PROT 7.5  ALBUMIN 3.9   No results for input(s): "LIPASE", "AMYLASE" in the last 168 hours. No results for input(s): "AMMONIA" in the last 168 hours.  ABG:    Component Value Date/Time   PHART 7.185 (LL) 04/10/2022 1604   PCO2ART 61.0 (H) 03/19/2022 1604   PO2ART 38 (LL) 04/08/2022 1604   HCO3 23.0 04/05/2022 1604   TCO2 25 03/20/2022 1604   ACIDBASEDEF 6.0 (H) 03/15/2022 1604    O2SAT 58 03/21/2022 1604    Coagulation Profile: Recent Labs  Lab 03/16/2022 1137  INR 1.1   Cardiac Enzymes: No results for input(s): "CKTOTAL", "CKMB", "CKMBINDEX", "TROPONINI" in the last 168 hours.  HbA1C: Hgb A1c MFr Bld  Date/Time Value Ref Range Status  03/17/2022 12:41 AM 5.5 4.8 - 5.6 % Final    Comment:    (NOTE) Pre diabetes:          5.7%-6.4%  Diabetes:              >6.4%  Glycemic control for   <7.0% adults with diabetes   04/01/2021 08:46 AM 5.9 4.6 - 6.5 % Final    Comment:    Glycemic Control Guidelines for People with Diabetes:Non Diabetic:  <6%Goal of Therapy: <7%Additional Action Suggested:  >8%    CBG: Recent Labs  Lab 03/20/2022 1757  GLUCAP 254*   Review of Systems:   Patient is encephalopathic and/or intubated. Therefore history has been obtained from chart review.   Past Medical History:  She,  has a past medical history of Abdominal aortic atherosclerosis ([place]) (11/2015), Allergy, Anemia, Anxiety, Automatic implantable cardioverter-defibrillator in situ, Bronchiectasis, Cancer ([place]), Chronic sinusitis, COPD (chronic obstructive pulmonary disease) ([place]), COVID-19 (12/06/2020), Depression, Dyspnea, Essential hypertension, Family history of adverse reaction to anesthesia, Fibrocystic breast disease, Fibromyalgia, GERD (gastroesophageal reflux disease), H/O multiple allergies (10/10/2013), Hemorrhoids, HFrEF (heart failure with reduced ejection fraction) ([place]), History of diverticulitis of colon, History of pneumonia (2012), Insomnia, Irritable bowel syndrome, Laryngeal nodule, Migraine without aura, without mention of intractable migraine without mention of status migrainosus, Mitral regurgitation, Monoclonal gammopathy, Neuropathy, Nonischemic cardiomyopathy ([place]), Osteoarthritis, Osteoporosis (01/2013), PONV (postoperative nausea and vomiting), sigmoid diverticulitis with perforation, abscess and fistula (09/30/2013), and Sleep apnea (2010).   Surgical  History:   Past Surgical History:  Procedure Laterality Date   ABDOMINAL HYSTERECTOMY  1987   w/BSO   ANAL RECTAL MANOMETRY N/A 02/02/2016   Procedure: ANO RECTAL MANOMETRY;  Surgeon: Mauri Pole, MD;  Location: [facility];  Service: Endoscopy;  Laterality: N/A;   APPENDECTOMY     AUGMENTATION MAMMAPLASTY     BIV UPGRADE N/A 11/04/2018   Procedure: BIV  ICD  UPGRADE;  Surgeon: Evans Lance, MD;  Location: [facility];  Service: Cardiovascular;  Laterality: N/A;   breast cystectomies     due to FCBD   BREAST MASS EXCISION  01/2010   Left-fibrocystic change w/intraductal papilloma, no malignancy   carotid US  47/4259   5-63% LICA, normal RICA   CATARACT EXTRACTION W/PHACO Right 03/08/2021   Procedure: CATARACT EXTRACTION PHACO AND INTRAOCULAR LENS PLACEMENT ([place]) RIGHT;  Surgeon: Birder Robson, MD;  Location: [facility];  Service: Ophthalmology;  Laterality: Right;  13.01 1:15.7   [place] Bunionectomy  11/04/08   Right Great Toe (Dr. Beola Cord)   COLONOSCOPY  01/2018   1 small benign polyp, diverticulosis, healthy anastomosis [name] @ [facility])   COLOSTOMY N/A 10/02/2013   DESCENDING COLOSTOMY;  Surgeon: Imogene Burn. [place], MD   COLOSTOMY REVERSAL N/A 02/10/2014   [name], MD   EMG/MCV  10/16/01   + mild carpal tunnel   ESOPHAGOGASTRODUODENOSCOPY  09/2014   small HH, irregular Z line, fundic gland polyps with mild gastritis Olevia Perches)   Flex laryngoscopy  06/11/06   ([place]) nml   IMPLANTABLE CARDIOVERTER DEFIBRILLATOR IMPLANT N/A 07/14/2011   MDT single chamber ICD   KNEE ARTHROSCOPY W/ ORIF     Left; "meniscus tear"   LASIK     left eye   MANDIBLE SURGERY     "got about 6 pins in the bottom of my jaw"   NCS  11/2014   L ulnar neuropathy, mild B median nerve entrapment ([place])   OSTEOTOMY     Left foot   PARTIAL COLECTOMY N/A 10/02/2013   PARTIAL COLECTOMY;  Surgeon: Imogene Burn. Tsuei, MD   RIGHT/LEFT HEART CATH AND CORONARY ANGIOGRAPHY N/A 10/16/2018    Procedure: RIGHT/LEFT HEART CATH AND CORONARY ANGIOGRAPHY;  Surgeon: Wellington Hampshire, MD;  Location: [facility];  Service: Cardiovascular;  Laterality: N/A;   RIGHT/LEFT HEART CATH AND CORONARY ANGIOGRAPHY N/A 03/16/2022   Procedure: RIGHT/LEFT HEART CATH AND CORONARY ANGIOGRAPHY;  Surgeon: Jolaine Artist, MD;  Location: [facility];  Service: Cardiovascular;  Laterality: N/A;   sinus surgery     x3 with balloon    TEE WITHOUT CARDIOVERSION N/A 03/16/2022   Procedure: TRANSESOPHAGEAL ECHOCARDIOGRAM (TEE);  Surgeon: Jolaine Artist, MD;  Location: [facility] ENDOSCOPY;  Service: Cardiovascular;  Laterality: N/A;   TOE AMPUTATION     "toe beside baby toe on left foot; got gangrene from corn"   [place]      Social History:   reports that she has never smoked. She has never used smokeless tobacco. She reports that she does not drink alcohol and does not use drugs.   Family History:  Her family history includes Alcohol abuse in her paternal grandfather; Arthritis in her mother; Colon polyps in her father; Dementia in her mother; Emphysema in her mother; Heart disease in her paternal grandfather and paternal grandmother; Hypertension in her maternal grandfather; Lung cancer in her father; Osteoporosis in her mother; Stroke in her mother and paternal grandfather. There is no history of Colon cancer, Esophageal cancer, or Gallbladder disease.   Allergies: Allergies  Allergen Reactions   Chlorhexidine Other (See Comments)    blisters   Codeine Other (See Comments)    Confusion and dizziness Makes pain level worse   Cyclobenzaprine Other (See Comments)    Adverse reaction - back and throat pain, dizziness, exhaustion   Cymbalta [Duloxetine Hcl] Other (See Comments)    "body spasms  and made me feel weird in my head"    Diflucan [Fluconazole] Nausea And Vomiting   Dilaudid [Hydromorphone Hcl] Other (See Comments)    Feels like she is burning internally     Influenza Vac Split [Influenza Virus Vaccine] Other (See Comments)    "allergic to concentrated eggs that are in vaccine"   Lasix [Furosemide] Other (See Comments)    Severe hypotension on lasix 40 PO (can handle 20 mg oral dosing) 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  Can take a different amount   Latex Nausea Only, Rash and Other (See Comments)    Pain and infection   Lipitor [Atorvastatin] Other (See Comments)    Severe muscle cramps to generic atorvastatin.  Able to take brand lipitor.   Morphine And Related Other (See Comments)    Internally feels like she is on fire    Pravastatin Other (See Comments)    myalgias   Prevacid [Lansoprazole] Other (See Comments)    Worsened GI side effects   Prevnar [Pneumococcal 13-Val Conj Vacc] Other (See Comments)    Egg allergy Bad reaction after shot   Rosuvastatin Other (See Comments)    Was not effective controlling lipids   Simvastatin Other (See Comments)    Leg cramps   Tape Other (See Comments) and Rash    All adhesives  - Blisters, itching and burning  blistering   Farxiga [Dapagliflozin] Other (See Comments)    Unknown reaction    Metaxalone Other (See Comments)    Unknown reaction    Tramadol     Leg cramps   Antihistamines, Chlorpheniramine-Type Other (See Comments)    Alters vision   Betadine [Povidone Iodine] Rash    Per pt.    Onion Diarrhea and Other (See Comments)    GI distress, flared IBS   Other Hives and Rash    NOTE: pt is able to take cephalosporins without reaction   Penicillins Other (See Comments)    CHILDHOOD ALLEGY/REACTION: "told 50 years ago I couldn't take it; might be immune to it", able to take amoxicillin Has patient had a PCN reaction causing immediate rash, facial/tongue/throat swelling, SOB or lightheadedness with hypotension: Unknown Has patient had a PCN reaction causing severe rash involving mucus membranes or skin necrosis: UnknowN Has patient had a PCN reaction that required hospitalization: /Unknown Has  patient had a PCN reaction occurring within the last 10 years: Unknown If a   [place] Nausea Only and Swelling   Sertraline Other (See Comments)    Overly sedating   Sulfasalazine Other (See Comments)    "makes my whole body smell like sulfa & makes me sick just smelling it"    Home Medications: Prior to Admission medications   Medication Sig Start Date End Date Taking? Authorizing Provider  acetaminophen (TYLENOL) 500 MG tablet Take 250 mg by mouth daily as needed for moderate pain or headache (PAIN).   Yes [provider]  albuterol (VENTOLIN HFA) 108 (90 Base) MCG/ACT inhaler Inhale 2 puffs into the lungs every 6 (six) hours as needed for wheezing or shortness of breath.   Yes [provider]  ALPRAZolam (XANAX) 0.25 MG tablet TAKE 1 TABLET BY MOUTH 2 TIMES DAILY AS NEEDED FOR ANXIETY. Patient taking differently: Take 0.125 mg by mouth 2 (two) times daily as needed for anxiety. 03/21/22  Yes [name], MD  aspirin EC 81 MG tablet Take 81 mg by mouth at bedtime.   Yes [provider]  beclomethasone (QVAR) 80 MCG/ACT inhaler Inhale  2 puffs into the lungs 2 (two) times daily. 03/04/21  Yes Parrett, Tammy S, NP  BENZONATATE PO Take by mouth daily as needed for cough.   Yes [provider]  calcium elemental as carbonate (TUMS ULTRA 1000) 400 MG chewable tablet Chew 1,000 mg by mouth daily as needed for heartburn.   Yes [provider]  Cholecalciferol (VITAMIN D3) 50 MCG (2000 UT) capsule Take 1 capsule (2,000 Units total) by mouth daily. 04/15/21  Yes [name], MD  Coenzyme Q10 200 MG capsule Take 200 mg by mouth daily.   Yes [provider]  dextromethorphan (DELSYM) 30 MG/5ML liquid Take 2.5 mLs by mouth daily as needed for cough.   Yes [provider]  docusate sodium (COLACE) 100 MG capsule Take 100 mg by mouth every morning.   Yes [provider]  ezetimibe (ZETIA) 10 MG tablet TAKE 1 TABLET BY MOUTH  EVERY DAY Patient taking differently: Take 10 mg by mouth at bedtime. 05/03/21  Yes Gollan, Kathlene November, MD  guaiFENesin (MUCINEX) 600 MG 12 hr tablet Take 1 tablet (600 mg total) by mouth 2 (two) times daily as needed for to loosen phlegm. Patient taking differently: Take 600 mg by mouth daily. 01/14/22  Yes [name], MD  levalbuterol [facility] HFA) 45 MCG/ACT inhaler Inhale 2 puffs into the lungs every 6 (six) hours as needed for wheezing or shortness of breath. Patient taking differently: Inhale 1-2 puffs into the lungs every 4 (four) hours as needed for wheezing or shortness of breath (Asthma). 09/15/21  Yes Furth, Cadence H, PA-C  Menthol, Topical Analgesic, (BIOFREEZE ROLL-ON EX) Apply 1 Application topically daily as needed (For shoulder pain).   Yes [provider]  miconazole (MICOTIN) 2 % powder Apply 1 application  topically daily as needed (yeast infection on stomach).   Yes [provider]  nystatin (MYCOSTATIN) 100000 UNIT/ML suspension TAKE 5 MLS (500,000 UNITS TOTAL) BY MOUTH 3 (THREE) TIMES DAILY. 03/16/22  Yes [name], MD  nystatin cream (MYCOSTATIN) Apply 1 application. topically as needed for dry skin (yeast infection). 10/14/21  Yes [name], MD  OXYGEN Inhale 2 L into the lungs continuous.   Yes [provider]  polyethylene glycol (MIRALAX / GLYCOLAX) 17 g packet Take 17 g by mouth daily as needed for mild constipation or moderate constipation.   Yes [provider]  potassium chloride 20 MEQ TBCR Take 20 mEq by mouth daily. 03/19/22  Yes Bhagat, Bhavinkumar, PA  sodium chloride (OCEAN) 0.65 % SOLN nasal spray Place 1 spray into both nostrils 2 (two) times daily.   Yes [provider]  spironolactone (ALDACTONE) 25 MG tablet Take 25 mg by mouth daily.   Yes [provider]  torsemide (DEMADEX) 20 MG tablet Take 1 tablet (20 mg total) by mouth daily. Take extra torsemide 10 mg with potassium as needed for  weight >174 03/19/22  Yes Bhagat, Bhavinkumar, PA  VITAMIN E PO Take 1 tablet by mouth daily.   Yes [provider]  zolpidem (AMBIEN) 5 MG tablet Take 0.5-1 tablets (2.5-5 mg total) by mouth at bedtime as needed for sleep. Patient taking differently: Take 1.25 mg by mouth at bedtime as needed for sleep. 03/24/22  Yes [name], MD   Critical care time: [address] minutes   Lestine Mount, [facility] 03/15/2022 6:09 PM  Please see [email or web address] for pager details.  From 7A-7P if no response, please call [phone] After hours, please call ELink  [phone] 

## 2022-03-31 ENCOUNTER — Encounter (HOSPITAL_COMMUNITY): Payer: Self-pay | Admitting: Cardiovascular Disease

## 2022-03-31 ENCOUNTER — Inpatient Hospital Stay (HOSPITAL_COMMUNITY): Payer: Medicare Other

## 2022-03-31 DIAGNOSIS — I340 Nonrheumatic mitral (valve) insufficiency: Secondary | ICD-10-CM | POA: Diagnosis not present

## 2022-03-31 DIAGNOSIS — R570 Cardiogenic shock: Secondary | ICD-10-CM

## 2022-03-31 LAB — CBC
HCT: 32.8 % — ABNORMAL LOW (ref 36.0–46.0)
Hemoglobin: 10.9 g/dL — ABNORMAL LOW (ref 12.0–15.0)
MCH: 30.4 pg (ref 26.0–34.0)
MCHC: 33.2 g/dL (ref 30.0–36.0)
MCV: 91.6 fL (ref 80.0–100.0)
Platelets: 166 10*3/uL (ref 150–400)
RBC: 3.58 MIL/uL — ABNORMAL LOW (ref 3.87–5.11)
RDW: 12.7 % (ref 11.5–15.5)
WBC: 14.9 10*3/uL — ABNORMAL HIGH (ref 4.0–10.5)
nRBC: 0 % (ref 0.0–0.2)

## 2022-03-31 LAB — GLUCOSE, CAPILLARY
Glucose-Capillary: 114 mg/dL — ABNORMAL HIGH (ref 70–99)
Glucose-Capillary: 156 mg/dL — ABNORMAL HIGH (ref 70–99)
Glucose-Capillary: 87 mg/dL (ref 70–99)

## 2022-03-31 LAB — BASIC METABOLIC PANEL
Anion gap: 11 (ref 5–15)
BUN: 23 mg/dL (ref 8–23)
CO2: 19 mmol/L — ABNORMAL LOW (ref 22–32)
Calcium: 9.2 mg/dL (ref 8.9–10.3)
Chloride: 106 mmol/L (ref 98–111)
Creatinine, Ser: 1.37 mg/dL — ABNORMAL HIGH (ref 0.44–1.00)
GFR, Estimated: 40 mL/min — ABNORMAL LOW (ref >60)
Glucose, Bld: 148 mg/dL — ABNORMAL HIGH (ref 70–99)
Potassium: 4.5 mmol/L (ref 3.5–5.1)
Sodium: 136 mmol/L (ref 135–145)

## 2022-03-31 LAB — HEPARIN LEVEL (UNFRACTIONATED): Heparin Unfractionated: 0.22 IU/mL — ABNORMAL LOW (ref 0.30–0.70)

## 2022-03-31 LAB — COOXEMETRY PANEL
Carboxyhemoglobin: 1.1 % (ref 0.5–1.5)
Methemoglobin: <0.7 % (ref 0.0–1.5)
O2 Saturation: 44.5 %
Total hemoglobin: 14.1 g/dL (ref 12.0–16.0)

## 2022-03-31 LAB — MAGNESIUM: Magnesium: 2 mg/dL (ref 1.7–2.4)

## 2022-03-31 MED ORDER — MIDAZOLAM HCL 2 MG/2ML IJ SOLN: 1.0000 mg | INTRAMUSCULAR | Status: DC | PRN

## 2022-03-31 MED ORDER — MIDAZOLAM-SODIUM CHLORIDE 100-0.9 MG/100ML-% IV SOLN
0.0000 mg/h | INTRAVENOUS | Status: DC
  Administered 2022-03-31: 2 mg/h via INTRAVENOUS
  Filled 2022-03-31: qty 100

## 2022-03-31 MED ORDER — MIDAZOLAM HCL 2 MG/2ML IJ SOLN
1.0000 mg | INTRAMUSCULAR | Status: DC | PRN
  Filled 2022-03-31: qty 2

## 2022-03-31 MED ORDER — ACETAMINOPHEN 325 MG PO TABS: 650.0000 mg | ORAL_TABLET | Freq: Four times a day (QID) | ORAL | Status: DC | PRN

## 2022-03-31 MED ORDER — ORAL CARE MOUTH RINSE: 15.0000 mL | OROMUCOSAL | Status: DC | PRN

## 2022-03-31 MED ORDER — GLYCOPYRROLATE 1 MG PO TABS: 1.0000 mg | ORAL_TABLET | ORAL | Status: DC | PRN

## 2022-03-31 MED ORDER — ORAL CARE MOUTH RINSE
15.0000 mL | OROMUCOSAL | Status: DC
  Administered 2022-03-31: 15 mL via OROMUCOSAL

## 2022-03-31 MED ORDER — FENTANYL 2500MCG IN NS 250ML (10MCG/ML) PREMIX INFUSION
0.0000 ug/h | INTRAVENOUS | Status: DC
  Administered 2022-03-31: 200 ug/h via INTRAVENOUS
  Filled 2022-03-31: qty 250

## 2022-03-31 MED ORDER — POLYVINYL ALCOHOL 1.4 % OP SOLN: 1.0000 [drp] | Freq: Four times a day (QID) | OPHTHALMIC | Status: DC | PRN

## 2022-03-31 MED ORDER — MIDAZOLAM BOLUS VIA INFUSION (WITHDRAWAL LIFE SUSTAINING TX): 2.0000 mg | INTRAVENOUS | Status: DC | PRN

## 2022-03-31 MED ORDER — FAMOTIDINE IN NACL 20-0.9 MG/50ML-% IV SOLN: 20.0000 mg | INTRAVENOUS | Status: DC

## 2022-03-31 MED ORDER — GLYCOPYRROLATE 0.2 MG/ML IJ SOLN: 0.2000 mg | INTRAMUSCULAR | Status: DC | PRN

## 2022-03-31 MED ORDER — FENTANYL BOLUS VIA INFUSION: 100.0000 ug | INTRAVENOUS | Status: DC | PRN

## 2022-03-31 MED ORDER — ACETAMINOPHEN 650 MG RE SUPP: 650.0000 mg | Freq: Four times a day (QID) | RECTAL | Status: DC | PRN

## 2022-03-31 MED ORDER — GLYCOPYRROLATE 0.2 MG/ML IJ SOLN
0.2000 mg | INTRAMUSCULAR | Status: DC | PRN
  Administered 2022-03-31: 0.2 mg via INTRAVENOUS
  Filled 2022-03-31: qty 1

## 2022-03-31 MED ORDER — ALPRAZOLAM 0.25 MG PO TABS
0.1250 mg | ORAL_TABLET | Freq: Two times a day (BID) | ORAL | Status: DC | PRN
  Administered 2022-03-31 (×2): 0.125 mg
  Filled 2022-03-31 (×2): qty 1

## 2022-03-31 MED FILL — Amiodarone HCl Inj 150 MG/3ML (50 MG/ML): INTRAVENOUS | Qty: 3 | Status: AC

## 2022-03-31 MED FILL — Norepinephrine Bitartrate IV Soln 1 MG/ML (Base Equivalent): INTRAVENOUS | Qty: 4 | Status: AC

## 2022-04-03 ENCOUNTER — Telehealth: Payer: Self-pay | Admitting: Family Medicine

## 2022-04-03 NOTE — Telephone Encounter (Signed)
Pt passed away over the weekend while hospitalized for MitraClip procedure for severe mitral valve regurgitation complicated by cardiogenic shock.  Called to speak with husband Herbie [place] to express my condolences.  Will cc Lattie Haw as fyi.

## 2022-04-03 NOTE — Telephone Encounter (Signed)
Noted  

## 2022-04-10 ENCOUNTER — Encounter (HOSPITAL_COMMUNITY): Payer: Medicare Other

## 2022-04-14 NOTE — Consult Note (Signed)
NAME:  CLARRISA KAYLOR, MRN:  [id number], DOB:  1945-12-12, LOS: 1 ADMISSION DATE:  03/17/2022 CONSULTATION DATE:  03/16/2022 REFERRING MD:  Bensimhon - HF CHIEF COMPLAINT:  Severe MR, aborted MitraClip procedure, vent management  History of Present Illness:  76 year old woman who presented to [facility] 11/16 for MitraClip procedure for definitive management of severe MR. PMHx significant for HTN, HFrEF (Echo 11/2 LVEF < 20%, severely dilated LA, severe MR), NICM s/p AICD placement, COPD with bronchiectasis, OSA, MGUS, GERD, IBS, diverticulitis, anxiety/depression.  Patient presented to [facility] for planned MitraClip procedure 11/16 and was transferred to cath lab. On induction, patient developed wide-complex tachycardia (rates 150-200) with associated hypotension; she was emergently cardioverted with conversion to paced rhythm (rate 140). Device was interrogated demonstrating AT with rate 110. Neo-Synephrine was initiated for BP support. Procedure was aborted due to hemodynamic instability and AHF team was consulted. R IJ Swan and IABP were placed by Dr. Daniel Nones.  PCCM consulted for vent management.  Pertinent Medical History:   Past Medical History:  Diagnosis Date   Abdominal aortic atherosclerosis ([place]) 11/2015   by CT   Allergy    Anemia    Anxiety    Automatic implantable cardioverter-defibrillator in situ    a. 2012 s/p MDT single lead ICD; b. 10/2018 BiV upgrade-->MDT ser # [id number] H.   Bronchiectasis    a. possibly mild, treat URIs aggressively   Cancer ([place])    basal cell, arms & neck   Chronic sinusitis    COPD (chronic obstructive pulmonary disease) ([place])    COVID-19 12/06/2020   Depression    Dyspnea    Essential hypertension    Pt denies   Family history of adverse reaction to anesthesia    Mother - PONV   Fibrocystic breast disease    Fibromyalgia    GERD (gastroesophageal reflux disease)    H/O multiple allergies 10/10/2013   Hemorrhoids    HFrEF (heart failure  with reduced ejection fraction) ([place])    a. 06/2011 cMRI: EF 34%, no LGE; b. 01/2016 Echo: EF 35-40%; c. 07/2016 Echo: EF 40-45%; d. 02/2020 Echo: EF 20-25%, glob HK. GrI DD. Mildly red RV fxn. Mildly dil LA. Triv MR.   History of diverticulitis of colon    History of pneumonia 2012   Insomnia    Irritable bowel syndrome    Laryngeal nodule    Dr. Thomasena Edis   Migraine without aura, without mention of intractable migraine without mention of status migrainosus    Mitral regurgitation    Monoclonal gammopathy    Neuropathy    feet and hands   Nonischemic cardiomyopathy ([place])    a. 2012 s/p single lead ICD; b. 06/2011 cMRI: EF 34%, no LGE; b. 01/2016 Echo: EF 35-40%; c. 07/2016 Echo: EF 40-45%; d. 10/2018 Cath: nl cors. EF 20-25%; e. 10/2018 s/p BiV MDT ICD upgrade; f. 02/2020 Echo: EF 20-25%, glob HK. GrI DD.   Osteoarthritis    knees, back, hands, low back,    Osteoporosis 01/2013   T-2.7 spine (01/2016)   PONV (postoperative nausea and vomiting)    sigmoid diverticulitis with perforation, abscess and fistula 09/30/2013   Sleep apnea 2010   borderline, inconclusive- /w [place] Hospital Events: Including procedures, antibiotic start and stop dates in addition to other pertinent events   11/16 - Admitted for MitraClip procedure. On induction, wide-complex tachycardic with rates 150s-200s and hypotension requiring cardioversion; converted to paced rhythm 140. Procedure aborted due to hemodynamic instability. Luiz Blare +  IABP placed by AHF. Transferred to [place] for further care. PCCM consulted. 11/17: CI 1.15, CO 2L  Interim History / Subjective:   Remains critically ill on mechanical support, IABP and pressors   Objective:  Blood pressure 126/76, pulse 60, temperature 99.1 F (37.3 C), resp. rate (!) 22, height '5\' 5"'$  (1.651 m), weight 81.7 kg, SpO2 100 %. PAP: (22-69)/(19-56) 36/26 CVP:  [3 mmHg-14 mmHg] 9 mmHg CO:  [1.9 L/min-2.4 L/min] 1.9 L/min CI:  [1 L/min/m2-1.3 L/min/m2] 1  L/min/m2  Vent Mode: PRVC FiO2 (%):  [50 %-100 %] 50 % Set Rate:  [18 bmp-22 bmp] 22 bmp Vt Set:  [460 mL] 460 mL PEEP:  [8 cmH20-10 cmH20] 10 cmH20 Plateau Pressure:  [17 cmH20-21 cmH20] 20 cmH20   Intake/Output Summary (Last 24 hours) at 2022-04-10 0859 Last data filed at 04/10/2022 0800 Gross per 24 hour  Intake 2385.46 ml  Output 320 ml  Net 2065.46 ml   Filed Weights   04/12/2022 1102 04/10/22 0500  Weight: 76.2 kg 81.7 kg   Physical Examination: General: elderly FM, intubated on life support, critically ill  HEENT: NCAT tracking appropriately,  Neuro: awake, alert following commands, no sedation  CV: RRR, systolic murmur, IABP mechanical sounds  PULM: BL ventilated breath sounds  GI: soft, nt nd  Extremities:cool extremities no significant edema  Skin: no rash   Resolved Hospital Problem List:    Assessment & Plan:   Cardiogenic shock Acute-on-chronic systolic HF S/p IAPB placement 11/16 History of HFrEF Echo 03/16/2022 with EF < 20%, severely decreased LV function, RV systolic function moderately reduced, LA severely dilated, no LAA thrombus, dilated mitral annulus with severe MR., trivial AR. P: Remains in ICU on IABP and pressors  Multidisciplinary rounds with AHF team at  bedside  Swan numbers reviewed  Continue diuresis  Family, son, updated at bedside  Remains on Nepi  On heparin ggt, appreciate pharmacy   Severe mitral valve regurgitation Aborted MitraClip procedure 11/16 Atrial tachycardia NICM s/p AICD Demonstrated on Echo 11/2. Taken for MitraClip with Dr. Burt Knack, procedure aborted due to hemodynamic instability on induction. P: Remains on amio and digoxin  Team to discuss options regarding MV repair  Discussed with Dr. [name] this AM. Considering a repeat attempt at clipping   Acute hypoxemic respiratory failure, periprocedural COPD with bronchiectasis OSA P: Remains on full vent support  LTVV  Continue bronchodilators  VAP bundle   PAD guideline sedation On precedex and fent  We will add prn versed Ok for [place] to sleep at night, home med   AKI on CKD in the setting of hemodynamic compromise P: Follow UOP and lytes   Best Practice: (right click and "Reselect all SmartList Selections" daily)   Per Primary Team  Labs:  CBC: Recent Labs  Lab 04/09/2022 1137 04/11/2022 1534 03/18/2022 1538 03/22/2022 1604 04/13/2022 1755 04/10/22 0342  WBC 10.5  --   --   --   --  14.9*  HGB 11.6* 10.2* 11.9*  11.9* 11.9* 11.9* 10.9*  HCT 36.3 30.0* 35.0*  35.0* 35.0* 35.0* 32.8*  MCV 93.6  --   --   --   --  91.6  PLT 225  --   --   --   --  159   Basic Metabolic Panel: Recent Labs  Lab 03/27/22 1547 03/28/2022 1137 03/29/2022 1534 04/13/2022 1538 04/11/2022 1604 03/29/2022 1755 2022-04-10 0342  NA 139 138 139 137  137 137 137 136  K 4.3 4.0 4.4 4.1  4.1 4.3  3.7 4.5  CL 105 106  --   --   --   --  106  CO2 22 20*  --   --   --   --  19*  GLUCOSE 91 105*  --   --   --   --  148*  BUN 22 22  --   --   --   --  23  CREATININE 1.03* 1.22*  --   --   --   --  1.37*  CALCIUM 10.1 9.9  --   --   --   --  9.2   GFR: Estimated Creatinine Clearance: 36.9 mL/min (A) (by C-G formula based on SCr of 1.37 mg/dL (H)). Recent Labs  Lab 04/01/2022 1137 03/23/2022 2134 04/19/2022 0342  WBC 10.5  --  14.9*  LATICACIDVEN  --  1.2  --    Liver Function Tests: Recent Labs  Lab 03/29/2022 1137  AST 18  ALT 17  ALKPHOS 103  BILITOT 0.8  PROT 7.5  ALBUMIN 3.9   No results for input(s): "LIPASE", "AMYLASE" in the last 168 hours. No results for input(s): "AMMONIA" in the last 168 hours.  ABG:    Component Value Date/Time   PHART 7.258 (L) 04/11/2022 1755   PCO2ART 49.1 (H) 03/17/2022 1755   PO2ART 100 03/15/2022 1755   HCO3 22.0 04/08/2022 1755   TCO2 23 03/23/2022 1755   ACIDBASEDEF 5.0 (H) 04/12/2022 1755   O2SAT 97 04/10/2022 1755    Coagulation Profile: Recent Labs  Lab 03/20/2022 1137  INR 1.1   Cardiac Enzymes: No  results for input(s): "CKTOTAL", "CKMB", "CKMBINDEX", "TROPONINI" in the last 168 hours.  HbA1C: Hgb A1c MFr Bld  Date/Time Value Ref Range Status  03/17/2022 12:41 AM 5.5 4.8 - 5.6 % Final    Comment:    (NOTE) Pre diabetes:          5.7%-6.4%  Diabetes:              >6.4%  Glycemic control for   <7.0% adults with diabetes   04/01/2021 08:46 AM 5.9 4.6 - 6.5 % Final    Comment:    Glycemic Control Guidelines for People with Diabetes:Non Diabetic:  <6%Goal of Therapy: <7%Additional Action Suggested:  >8%    CBG: Recent Labs  Lab 03/17/2022 1757 04/05/2022 2001 04/19/22 0011 19-Apr-2022 0754  GLUCAP 254* 191* 156* 87   Review of Systems:   Patient is encephalopathic and/or intubated. Therefore history has been obtained from chart review.   Past Medical History:  She,  has a past medical history of Abdominal aortic atherosclerosis ([place]) (11/2015), Allergy, Anemia, Anxiety, Automatic implantable cardioverter-defibrillator in situ, Bronchiectasis, Cancer ([place]), Chronic sinusitis, COPD (chronic obstructive pulmonary disease) ([place]), COVID-19 (12/06/2020), Depression, Dyspnea, Essential hypertension, Family history of adverse reaction to anesthesia, Fibrocystic breast disease, Fibromyalgia, GERD (gastroesophageal reflux disease), H/O multiple allergies (10/10/2013), Hemorrhoids, HFrEF (heart failure with reduced ejection fraction) ([place]), History of diverticulitis of colon, History of pneumonia (2012), Insomnia, Irritable bowel syndrome, Laryngeal nodule, Migraine without aura, without mention of intractable migraine without mention of status migrainosus, Mitral regurgitation, Monoclonal gammopathy, Neuropathy, Nonischemic cardiomyopathy ([place]), Osteoarthritis, Osteoporosis (01/2013), PONV (postoperative nausea and vomiting), sigmoid diverticulitis with perforation, abscess and fistula (09/30/2013), and Sleep apnea (2010).   Surgical History:   Past Surgical History:  Procedure Laterality Date    ABDOMINAL HYSTERECTOMY  1987   w/BSO   ANAL RECTAL MANOMETRY N/A 02/02/2016   Procedure: ANO RECTAL MANOMETRY;  Surgeon: Mauri Pole,  MD;  Location: [facility];  Service: Endoscopy;  Laterality: N/A;   APPENDECTOMY     AUGMENTATION MAMMAPLASTY     BIV UPGRADE N/A 11/04/2018   Procedure: BIV ICD  UPGRADE;  Surgeon: Evans Lance, MD;  Location: [place] [facility];  Service: Cardiovascular;  Laterality: N/A;   breast cystectomies     due to FCBD   BREAST MASS EXCISION  01/2010   Left-fibrocystic change w/intraductal papilloma, no malignancy   carotid US  48/5462   7-03% LICA, normal RICA   CATARACT EXTRACTION W/PHACO Right 03/08/2021   Procedure: CATARACT EXTRACTION PHACO AND INTRAOCULAR LENS PLACEMENT ([place]) RIGHT;  Surgeon: Birder Robson, MD;  Location: [facility];  Service: Ophthalmology;  Laterality: Right;  13.01 1:15.7   CENTRAL LINE INSERTION  03/28/2022   Procedure: CENTRAL LINE INSERTION;  Surgeon: Sherren Mocha, MD;  Location: [place] [facility];  Service: Cardiovascular;;   [place] Clap Bunionectomy  11/04/08   Right Great Toe (Dr. Beola Cord)   COLONOSCOPY  01/2018   1 small benign polyp, diverticulosis, healthy anastomosis [name] @ [facility])   COLOSTOMY N/A 10/02/2013   DESCENDING COLOSTOMY;  Surgeon: Imogene Burn. Tsuei, MD   COLOSTOMY REVERSAL N/A 02/10/2014   [name], MD   EMG/MCV  10/16/01   + mild carpal tunnel   ESOPHAGOGASTRODUODENOSCOPY  09/2014   small HH, irregular Z line, fundic gland polyps with mild gastritis Olevia Perches)   Flex laryngoscopy  06/11/06   ([place]) nml   IABP INSERTION N/A 03/18/2022   Procedure: IABP Insertion;  Surgeon: Sherren Mocha, MD;  Location: [place] [facility];  Service: Cardiovascular;  Laterality: N/A;   IMPLANTABLE CARDIOVERTER DEFIBRILLATOR IMPLANT N/A 07/14/2011   MDT single chamber ICD   KNEE ARTHROSCOPY W/ ORIF     Left; "meniscus tear"   LASIK     left eye   MANDIBLE SURGERY     "got about 6 pins in the bottom of  my jaw"   MITRAL VALVE REPAIR N/A 03/29/2022   Procedure: MITRAL VALVE REPAIR;  Surgeon: Sherren Mocha, MD;  Location: [place] [facility];  Service: Cardiovascular;  Laterality: N/A;   NCS  11/2014   L ulnar neuropathy, mild B median nerve entrapment ([place])   OSTEOTOMY     Left foot   PARTIAL COLECTOMY N/A 10/02/2013   PARTIAL COLECTOMY;  Surgeon: Imogene Burn. Tsuei, MD   RIGHT HEART CATH N/A 03/29/2022   Procedure: RIGHT HEART CATH;  Surgeon: Sherren Mocha, MD;  Location: [place] [facility];  Service: Cardiovascular;  Laterality: N/A;   RIGHT/LEFT HEART CATH AND CORONARY ANGIOGRAPHY N/A 10/16/2018   Procedure: RIGHT/LEFT HEART CATH AND CORONARY ANGIOGRAPHY;  Surgeon: Wellington Hampshire, MD;  Location: [place] [facility];  Service: Cardiovascular;  Laterality: N/A;   RIGHT/LEFT HEART CATH AND CORONARY ANGIOGRAPHY N/A 03/16/2022   Procedure: RIGHT/LEFT HEART CATH AND CORONARY ANGIOGRAPHY;  Surgeon: Jolaine Artist, MD;  Location: [place] [facility];  Service: Cardiovascular;  Laterality: N/A;   sinus surgery     x3 with balloon    TEE WITHOUT CARDIOVERSION N/A 03/16/2022   Procedure: TRANSESOPHAGEAL ECHOCARDIOGRAM (TEE);  Surgeon: Jolaine Artist, MD;  Location: [facility]OSCOPY;  Service: Cardiovascular;  Laterality: N/A;   TEE WITHOUT CARDIOVERSION N/A 04/11/2022   Procedure: TRANSESOPHAGEAL ECHOCARDIOGRAM (TEE);  Surgeon: Sherren Mocha, MD;  Location: [place] [facility];  Service: Cardiovascular;  Laterality: N/A;   TOE AMPUTATION     "toe beside baby toe on left foot; got gangrene from corn"  TONSILLECTOMY AND ADENOIDECTOMY      Social History:   reports that she has never smoked. She has never used smokeless tobacco. She reports that she does not drink alcohol and does not use drugs.   Family History:  Her family history includes Alcohol abuse in her paternal grandfather; Arthritis in her mother; Colon polyps in her father; Dementia in her mother; Emphysema in her mother; Heart  disease in her paternal grandfather and paternal grandmother; Hypertension in her maternal grandfather; Lung cancer in her father; Osteoporosis in her mother; Stroke in her mother and paternal grandfather. There is no history of Colon cancer, Esophageal cancer, or Gallbladder disease.   Allergies: Allergies  Allergen Reactions   Chlorhexidine Other (See Comments)    blisters   Codeine Other (See Comments)    Confusion and dizziness Makes pain level worse   Cyclobenzaprine Other (See Comments)    Adverse reaction - back and throat pain, dizziness, exhaustion   Cymbalta [Duloxetine Hcl] Other (See Comments)    "body spasms and made me feel weird in my head"    Diflucan [Fluconazole] Nausea And Vomiting   Dilaudid [Hydromorphone Hcl] Other (See Comments)    Feels like she is burning internally    Influenza Vac Split [Influenza Virus Vaccine] Other (See Comments)    "allergic to concentrated eggs that are in vaccine"   Lasix [Furosemide] Other (See Comments)    Severe hypotension on lasix 40 PO (can handle 20 mg oral dosing) 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  Can take a different amount   Latex Nausea Only, Rash and Other (See Comments)    Pain and infection   Lipitor [Atorvastatin] Other (See Comments)    Severe muscle cramps to generic atorvastatin.  Able to take brand lipitor.   Morphine And Related Other (See Comments)    Internally feels like she is on fire    Pravastatin Other (See Comments)    myalgias   Prevacid [Lansoprazole] Other (See Comments)    Worsened GI side effects   Prevnar [Pneumococcal 13-Val Conj Vacc] Other (See Comments)    Egg allergy Bad reaction after shot   Rosuvastatin Other (See Comments)    Was not effective controlling lipids   Simvastatin Other (See Comments)    Leg cramps   Tape Other (See Comments) and Rash    All adhesives  - Blisters, itching and burning  blistering   Farxiga [Dapagliflozin] Other (See Comments)    Unknown reaction    Metaxalone Other  (See Comments)    Unknown reaction    Tramadol     Leg cramps   Antihistamines, Chlorpheniramine-Type Other (See Comments)    Alters vision   Betadine [Povidone Iodine] Rash    Per pt.    Onion Diarrhea and Other (See Comments)    GI distress, flared IBS   Other Hives and Rash    NOTE: pt is able to take cephalosporins without reaction   Penicillins Other (See Comments)    CHILDHOOD ALLEGY/REACTION: "told 50 years ago I couldn't take it; might be immune to it", able to take amoxicillin Has patient had a PCN reaction causing immediate rash, facial/tongue/throat swelling, SOB or lightheadedness with hypotension: Unknown Has patient had a PCN reaction causing severe rash involving mucus membranes or skin necrosis: UnknowN Has patient had a PCN reaction that required hospitalization: /Unknown Has patient had a PCN reaction occurring within the last 10 years: Unknown If a   Red [place] Nausea Only and Swelling   Sertraline Other (  See Comments)    Overly sedating   Sulfasalazine Other (See Comments)    "makes my whole body smell like sulfa & makes me sick just smelling it"    Home Medications: Prior to Admission medications   Medication Sig Start Date End Date Taking? Authorizing Provider  acetaminophen (TYLENOL) 500 MG tablet Take 250 mg by mouth daily as needed for moderate pain or headache (PAIN).   Yes [provider]  albuterol (VENTOLIN HFA) 108 (90 Base) MCG/ACT inhaler Inhale 2 puffs into the lungs every 6 (six) hours as needed for wheezing or shortness of breath.   Yes [provider]  ALPRAZolam (XANAX) 0.25 MG tablet TAKE 1 TABLET BY MOUTH 2 TIMES DAILY AS NEEDED FOR ANXIETY. Patient taking differently: Take 0.125 mg by mouth 2 (two) times daily as needed for anxiety. 03/21/22  Yes [name], MD  aspirin EC 81 MG tablet Take 81 mg by mouth at bedtime.   Yes [provider]  beclomethasone (QVAR) 80 MCG/ACT inhaler Inhale 2 puffs into the lungs 2  (two) times daily. 03/04/21  Yes Parrett, Tammy S, NP  BENZONATATE PO Take by mouth daily as needed for cough.   Yes [provider]  calcium elemental as carbonate (TUMS ULTRA 1000) 400 MG chewable tablet Chew 1,000 mg by mouth daily as needed for heartburn.   Yes [provider]  Cholecalciferol (VITAMIN D3) 50 MCG (2000 UT) capsule Take 1 capsule (2,000 Units total) by mouth daily. 04/15/21  Yes [name], MD  Coenzyme Q10 200 MG capsule Take 200 mg by mouth daily.   Yes [provider]  dextromethorphan (DELSYM) 30 MG/5ML liquid Take 2.5 mLs by mouth daily as needed for cough.   Yes [provider]  docusate sodium (COLACE) 100 MG capsule Take 100 mg by mouth every morning.   Yes [provider]  ezetimibe (ZETIA) 10 MG tablet TAKE 1 TABLET BY MOUTH EVERY DAY Patient taking differently: Take 10 mg by mouth at bedtime. 05/03/21  Yes Gollan, Kathlene November, MD  guaiFENesin (MUCINEX) 600 MG 12 hr tablet Take 1 tablet (600 mg total) by mouth 2 (two) times daily as needed for to loosen phlegm. Patient taking differently: Take 600 mg by mouth daily. 01/14/22  Yes [name], MD  levalbuterol [facility] HFA) 45 MCG/ACT inhaler Inhale 2 puffs into the lungs every 6 (six) hours as needed for wheezing or shortness of breath. Patient taking differently: Inhale 1-2 puffs into the lungs every 4 (four) hours as needed for wheezing or shortness of breath (Asthma). 09/15/21  Yes Furth, Cadence H, PA-C  Menthol, Topical Analgesic, (BIOFREEZE ROLL-ON EX) Apply 1 Application topically daily as needed (For shoulder pain).   Yes [provider]  miconazole (MICOTIN) 2 % powder Apply 1 application  topically daily as needed (yeast infection on stomach).   Yes [provider]  nystatin (MYCOSTATIN) 100000 UNIT/ML suspension TAKE 5 MLS (500,000 UNITS TOTAL) BY MOUTH 3 (THREE) TIMES DAILY. 03/16/22  Yes [name], MD  nystatin cream (MYCOSTATIN)  Apply 1 application. topically as needed for dry skin (yeast infection). 10/14/21  Yes [name], MD  OXYGEN Inhale 2 L into the lungs continuous.   Yes [provider]  polyethylene glycol (MIRALAX / GLYCOLAX) 17 g packet Take 17 g by mouth daily as needed for mild constipation or moderate constipation.   Yes [provider]  potassium chloride 20 MEQ TBCR Take 20 mEq by mouth daily. 03/19/22  Yes Bhagat, Crista Luria,  PA  sodium chloride (OCEAN) 0.65 % SOLN nasal spray Place 1 spray into both nostrils 2 (two) times daily.   Yes [provider]  spironolactone (ALDACTONE) 25 MG tablet Take 25 mg by mouth daily.   Yes [provider]  torsemide (DEMADEX) 20 MG tablet Take 1 tablet (20 mg total) by mouth daily. Take extra torsemide 10 mg with potassium as needed for weight >174 03/19/22  Yes Bhagat, Bhavinkumar, PA  VITAMIN E PO Take 1 tablet by mouth daily.   Yes [provider]  zolpidem (AMBIEN) 5 MG tablet Take 0.5-1 tablets (2.5-5 mg total) by mouth at bedtime as needed for sleep. Patient taking differently: Take 1.25 mg by mouth at bedtime as needed for sleep. 03/24/22  Yes [name], MD    This patient is critically ill with multiple organ system failure; which, requires frequent high complexity decision making, assessment, support, evaluation, and titration of therapies. This was completed through the application of advanced monitoring technologies and extensive interpretation of multiple databases. During this encounter critical care time was devoted to patient care services described in this note for 36 minutes.  [facility] 04-12-22 8:59 AM

## 2022-04-14 NOTE — Progress Notes (Signed)
Heart Failure Navigator Progress Note  Assessed for Heart & Vascular TOC clinic readiness.  Patient does not meet criteria due to Advance Heart Failure Team patient of Dr. [name].     Earnestine Leys, BSN, Clinical cytogeneticist Only

## 2022-04-14 NOTE — Progress Notes (Signed)
Pt extubated per comfort care order.

## 2022-04-14 NOTE — Progress Notes (Signed)
eLink Physician-Brief Progress Note Patient Name: Ashley [name] DOB: [birth year] MRN: [id number]   Date of Service  2022-04-02  HPI/Events of Note  Nursing request for CXR to confirm IABP placement.   eICU Interventions  Plan: Portable CXR STAT.     Intervention Category Major Interventions: Other:  Lysle Dingwall April 02, 2022, 5:17 AM

## 2022-04-14 NOTE — Progress Notes (Addendum)
[place] for heparin Indication:  IABP  Allergies  Allergen Reactions   Chlorhexidine Other (See Comments)    blisters   Codeine Other (See Comments)    Confusion and dizziness Makes pain level worse   Cyclobenzaprine Other (See Comments)    Adverse reaction - back and throat pain, dizziness, exhaustion   Cymbalta [Duloxetine Hcl] Other (See Comments)    "body spasms and made me feel weird in my head"    Diflucan [Fluconazole] Nausea And Vomiting   Dilaudid [Hydromorphone Hcl] Other (See Comments)    Feels like she is burning internally    Influenza Vac Split [Influenza Virus Vaccine] Other (See Comments)    "allergic to concentrated eggs that are in vaccine"   Lasix [Furosemide] Other (See Comments)    Severe hypotension on lasix 40 PO (can handle 20 mg oral dosing) 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  Can take a different amount   Latex Nausea Only, Rash and Other (See Comments)    Pain and infection   Lipitor [Atorvastatin] Other (See Comments)    Severe muscle cramps to generic atorvastatin.  Able to take brand lipitor.   Morphine And Related Other (See Comments)    Internally feels like she is on fire    Pravastatin Other (See Comments)    myalgias   Prevacid [Lansoprazole] Other (See Comments)    Worsened GI side effects   Prevnar [Pneumococcal 13-Val Conj Vacc] Other (See Comments)    Egg allergy Bad reaction after shot   Rosuvastatin Other (See Comments)    Was not effective controlling lipids   Simvastatin Other (See Comments)    Leg cramps   Tape Other (See Comments) and Rash    All adhesives  - Blisters, itching and burning  blistering   Farxiga [Dapagliflozin] Other (See Comments)    Unknown reaction    Metaxalone Other (See Comments)    Unknown reaction    Tramadol     Leg cramps   Antihistamines, Chlorpheniramine-Type Other (See Comments)    Alters vision   Betadine [Povidone Iodine] Rash    Per pt.    Onion Diarrhea and  Other (See Comments)    GI distress, flared IBS   Other Hives and Rash    NOTE: pt is able to take cephalosporins without reaction   Penicillins Other (See Comments)    CHILDHOOD ALLEGY/REACTION: "told 50 years ago I couldn't take it; might be immune to it", able to take amoxicillin Has patient had a PCN reaction causing immediate rash, facial/tongue/throat swelling, SOB or lightheadedness with hypotension: Unknown Has patient had a PCN reaction causing severe rash involving mucus membranes or skin necrosis: UnknowN Has patient had a PCN reaction that required hospitalization: /Unknown Has patient had a PCN reaction occurring within the last 10 years: Unknown If a   Red Dye Nausea Only and Swelling   Sertraline Other (See Comments)    Overly sedating   Sulfasalazine Other (See Comments)    "makes my whole body smell like sulfa & makes me sick just smelling it"    Patient Measurements: Height: '5\' 5"'$  (165.1 cm) Weight: 81.7 kg (180 lb 1.9 oz) IBW/kg (Calculated) : 57 Heparin Dosing Weight: 73kg  Vital Signs: Temp: 99 F (37.2 C) (11/17 0813) BP: 118/76 (11/17 0800) Pulse Rate: 62 (11/17 0813)  Labs: Recent Labs    03/19/2022 1137 03/29/2022 1534 04/07/2022 1604 04/10/2022 1755 17-Apr-2022 0215 Apr 17, 2022 0342  HGB 11.6*   < > 11.9* 11.9*  --  10.9*  HCT 36.3   < > 35.0* 35.0*  --  32.8*  PLT 225  --   --   --   --  166  LABPROT 14.0  --   --   --   --   --   INR 1.1  --   --   --   --   --   HEPARINUNFRC  --   --   --   --  0.22*  --   CREATININE 1.22*  --   --   --   --  1.37*   < > = values in this interval not displayed.     Estimated Creatinine Clearance: 36.9 mL/min (A) (by C-G formula based on SCr of 1.37 mg/dL (H)).   Medical History: Past Medical History:  Diagnosis Date   Abdominal aortic atherosclerosis ([place]) 11/2015   by CT   Allergy    Anemia    Anxiety    Automatic implantable cardioverter-defibrillator in situ    a. 2012 s/p MDT single lead ICD; b. 10/2018  BiV upgrade-->MDT ser # [id number] H.   Bronchiectasis    a. possibly mild, treat URIs aggressively   Cancer ([place])    basal cell, arms & neck   Chronic sinusitis    COPD (chronic obstructive pulmonary disease) ([place])    COVID-19 12/06/2020   Depression    Dyspnea    Essential hypertension    Pt denies   Family history of adverse reaction to anesthesia    Mother - PONV   Fibrocystic breast disease    Fibromyalgia    GERD (gastroesophageal reflux disease)    H/O multiple allergies 10/10/2013   Hemorrhoids    HFrEF (heart failure with reduced ejection fraction) ([place])    a. 06/2011 cMRI: EF 34%, no LGE; b. 01/2016 Echo: EF 35-40%; c. 07/2016 Echo: EF 40-45%; d. 02/2020 Echo: EF 20-25%, glob HK. GrI DD. Mildly red RV fxn. Mildly dil LA. Triv MR.   History of diverticulitis of colon    History of pneumonia 2012   Insomnia    Irritable bowel syndrome    Laryngeal nodule    Dr. Thomasena Edis   Migraine without aura, without mention of intractable migraine without mention of status migrainosus    Mitral regurgitation    Monoclonal gammopathy    Neuropathy    feet and hands   Nonischemic cardiomyopathy ([place])    a. 2012 s/p single lead ICD; b. 06/2011 cMRI: EF 34%, no LGE; b. 01/2016 Echo: EF 35-40%; c. 07/2016 Echo: EF 40-45%; d. 10/2018 Cath: nl cors. EF 20-25%; e. 10/2018 s/p BiV MDT ICD upgrade; f. 02/2020 Echo: EF 20-25%, glob HK. GrI DD.   Osteoarthritis    knees, back, hands, low back,    Osteoporosis 01/2013   T-2.7 spine (01/2016)   PONV (postoperative nausea and vomiting)    sigmoid diverticulitis with perforation, abscess and fistula 09/30/2013   Sleep apnea 2010   borderline, inconclusive- /w [place]     Assessment: 76 year old female admitted for mitraclip but patient had complications intra-op requiring intubation, cardioversion, and insertion of balloon pump. On IV heparin for balloon pump. Heparin level 0.22 within goal range on 900 units/hr. Hemoglobin stable at 10.9s, plt 166  stable. No s/sx bleeding or issues with infusion per RN.   Goal of Therapy:  Heparin level 0.2-0.5 units/ml Monitor platelets by anticoagulation protocol: Yes   Plan:  Continue heparin at 900 units/hr Obtain confirmatory heparin level '@1100'$  Check heparin  level and CBC daily while on heparin Monitor s/sx bleeding   Eliseo Gum, PharmD PGY1 Pharmacy Resident   2022-04-29  8:44 AM

## 2022-04-14 NOTE — Anesthesia Postprocedure Evaluation (Signed)
Anesthesia Post Note  Patient: Ashley [name]  Procedure(s) Performed: MITRAL VALVE REPAIR TRANSESOPHAGEAL ECHOCARDIOGRAM (TEE) CENTRAL LINE INSERTION IABP Insertion RIGHT HEART CATH     Patient location during evaluation: SICU Anesthesia Type: General Level of consciousness: sedated Pain management: pain level controlled Vital Signs Assessment: post-procedure vital signs reviewed and stable Respiratory status: patient remains intubated per anesthesia plan Cardiovascular status: stable Postop Assessment: no apparent nausea or vomiting Anesthetic complications: no   There were no known notable events for this encounter.  Last Vitals:  Vitals:   Apr 27, 2022 0645 April 27, 2022 0700  BP:  125/73  Pulse: (!) 59   Resp: (!) 22   Temp: 37 C   SpO2: 100%     Last Pain:  Vitals:   03/27/2022 2000  TempSrc: Oral                 Keir Viernes S

## 2022-04-14 NOTE — Death Summary Note (Addendum)
Advanced Heart Failure Death Summary  Death Summary   Patient ID: Ashley [name] MRN: [id number], DOB/AGE: Jul 12, 1945 76 y.o. Admit date: 04/12/2022 D/C date:     04-03-22   Primary Discharge Diagnoses:  Stage D, Acute on Chronic Biventricular Systolic Heart Failure, NYHC Class IV/ Cardiogenic Shock  Severe Mitral Regurgitation   Acute Hypoxic Respiratory Failure Requiring Intubation  Hemodynamically unstable SVT requiring emergent DCCV  LBBB s/p CRT-D (non-responder)  AKI on CKD IIIa     Hospital Course:  76 y.o.with a history of morbid obesity, HTN, COPD, GERD, fibromyalgia, s/p partial colon resection for diverticulitis with re-connection, MGUS, CKD IIIa, LBBB and chronic systolic HF due to NICM s/p CRT-D (non-responder) and recent progression to end-stage heart failure w/ NHYA Class IIlb-IV symptoms.   Underwent recent w/u for advanced therapies. Considered for VAD vs MitraClip. She was set up for outpatient RHC and TEE 11/2. Studies showed severe biventricular dysfunction, LVEF < 20%, RV mod reduced, severe MR and volume overload with low output, CI 1.8. Diuresed w/ IV Lasix. W/u continued for advanced therapies. After weighing options, patient decided against VAD. Agreeable to MitraClip. Evaluated by Structural heart team and scheduled for MitraClip 11/16.   Presented back 11/16 for MitraClip. Unfortunately, shortly after induction patient became hypotensive requiring phenylephrine with HR in a wide complex rhythm with rates ~140s. She was cardioverted to sinus tachycardia. Intubated in cath lab. Due to persistent hypotension decision was made to abort procedure. Emergent RHC done. Hemodynamics w/ severely elevated filling pressures. IABP placed due to severity of MR. After IABP placement, there was significant improvement in V waves. Admitted to CCU. Diuresed w/ IV Lasix. Weaned off NEO. Continued w/ pressor requirements and transitioned to NE. Placed on amio gtt.    Filling  pressures improved with unloading but CO remained profoundly low despite support. She was felt to be end-stage and unlikely to benefit from MitraClip w/ severe LV dysfunction. GOC was discussed w/ family and decision was made to transition to comfort care. On 11/17, pacer was disabled. Patient sedated w/ versed and fentanyl. She was extubated, pressors discontinued and IABP turned off. She passed quickly and peacefully. Time of death at 620 pm.      Significant Diagnostic Studies  RHC 04/10/2022 HEMODYNAMICS: On moderate doses of levophed & phenylephrine & intubated.  RA:                  20 mmHg with large A & V waves RV:                  70/10-20 mmHg PA:                  81/44 mmHg (62 mean) PCWP:            50 mmHg with V waves to 90                                       Estimated Fick CO/CI   3.8 L/min, 2.1 L/min/m2                                              TPG                 12  mmHg  PVR                 3.5 Wood Units  PAPi                1.85   Hemodynamics after IABP at 1:1, pressors weaned off:    PA: 71/43 (54) PCWP: 50 with V waves to 70   IMPRESSION: Severely elevated pre and post capillary filling pressures.  Moderately reduced cardiac index Large V waves consistent with severe mitral regurgitation Improvement in V waves by 60mHg after IABP started at 1:1     Consultations  AHF Team Structural Heart Team Pulmonary and Critical Care Medicine    Duration of Discharge Encounter: Greater than 35 minutes   Signed, BLyda Jester PA-C 04/03/2022, 11:36 AM  Agree with above.   DGlori Bickers MD  5:45 PM

## 2022-04-14 NOTE — Progress Notes (Addendum)
Pt refusing to use antiseptic mouth rinse. Explained that it does not contain CHG. Allowing cleaning with water only. Educated on risk of Ventilator associated pneumonia.

## 2022-04-14 NOTE — Progress Notes (Signed)
  Long discussion with patient and family about situation and the fact that she is not a candidate for mTEER due to severe LV dysfunction.   Family clear that they do not want her to suffer and would like terminal extubation and comfort care.  I disabled pacer. Underlying sinus rhythm 70s.    Patient sedated with versed and fentanyl.   I extubated personally and turned off IABP.   She passed quickly and peacefully. Time of death at 108p.  Addition CCT 1 hour.   Glori Bickers, MD  6:39 PM

## 2022-04-14 NOTE — Progress Notes (Signed)
PCCM:  Palliative care discussion with Dr. [name], myself, nursing, son and husband.   Plans for DNR status and transition to comfort care once family has had time to visit.   Garner Nash, DO [facility] 04-07-2022 9:36 AM

## 2022-04-14 NOTE — Progress Notes (Addendum)
Advanced Heart Failure Rounding Note  PCP-Cardiologist: [name], MD   Subjective:    RHC hemodynamics w/ severely elevated filling pressures. IABP placed due to severity of MR. After IABP placement, pressors were quickly weaned off and there was significant improvement in V waves.   This morning, on NE 1. IABP 1:1  Swan #s CVP 11 PAP 40/25 PCWP 25 CO 2.11 CI 11.5 SVR 2788  Amio gtt at 30/hr. Rhythm stable overnight. A-paced.   WBC 10.5>>14.9. CXR w/ interval improvement in pulmonary edema, small effusions. Bibasilar aeration improved.   SCr 1.22>>1.37   Awake on vent. Following commands. Anxious. Son at bedside.    Objective:   Weight Range: 81.7 kg Body mass index is 29.97 kg/m.   Vital Signs:   Temp:  [95.2 F (35.1 C)-99.1 F (37.3 C)] 99 F (37.2 C) (11/17 0813) Pulse Rate:  [0-217] 62 (11/17 0813) Resp:  [0-28] 16 (11/17 0813) BP: (89-156)/(41-96) 118/76 (11/17 0800) SpO2:  [88 %-100 %] 100 % (11/17 0813) Arterial Line BP: (85-187)/(37-84) 126/51 (11/17 0800) FiO2 (%):  [50 %-100 %] 50 % (11/17 0813) Weight:  [76.2 kg-81.7 kg] 81.7 kg (11/17 0500) Last BM Date :  (PTA)  Weight change: Filed Weights   04/11/2022 1102 04-18-22 0500  Weight: 76.2 kg 81.7 kg    Intake/Output:   Intake/Output Summary (Last 24 hours) at 18-Apr-2022 1157 Last data filed at 04/18/22 0617 Gross per 24 hour  Intake 2259.12 ml  Output 320 ml  Net 1939.12 ml      Physical Exam    CVP 11  General:  awake on vent, anxious  HEENT: Normal + ETT  Neck: Supple. JVP 11 . Carotids 2+ bilat; no bruits. No lymphadenopathy or thyromegaly appreciated. + rt IJ swan  Cor: PMI nondisplaced. Regular rate & rhythm. No rubs, gallops or murmurs. Lungs: Intubated and clear  Abdomen: Soft, nontender, nondistended. No hepatosplenomegaly. No bruits or masses. Good bowel sounds. Extremities: No cyanosis, clubbing, rash, trace b/l LE edema. + SCDs Neuro: intubated. Awake on vent  and following commands    Telemetry   A-paced 60s   EKG    No new EKG to review   Labs    CBC Recent Labs    03/21/2022 1137 04/01/2022 1534 04/08/2022 1755 04-18-2022 0342  WBC 10.5  --   --  14.9*  HGB 11.6*   < > 11.9* 10.9*  HCT 36.3   < > 35.0* 32.8*  MCV 93.6  --   --  91.6  PLT 225  --   --  166   < > = values in this interval not displayed.   Basic Metabolic Panel Recent Labs    04/05/2022 1137 03/26/2022 1534 03/16/2022 1755 Apr 18, 2022 0342  NA 138   < > 137 136  K 4.0   < > 3.7 4.5  CL 106  --   --  106  CO2 20*  --   --  19*  GLUCOSE 105*  --   --  148*  BUN 22  --   --  23  CREATININE 1.22*  --   --  1.37*  CALCIUM 9.9  --   --  9.2   < > = values in this interval not displayed.   Liver Function Tests Recent Labs    03/22/2022 1137  AST 18  ALT 17  ALKPHOS 103  BILITOT 0.8  PROT 7.5  ALBUMIN 3.9   No results for input(s): "LIPASE", "AMYLASE" in  the last 72 hours. Cardiac Enzymes No results for input(s): "CKTOTAL", "CKMB", "CKMBINDEX", "TROPONINI" in the last 72 hours.  BNP: BNP (last 3 results) Recent Labs    03/13/22 0951 03/16/22 1703 03/28/2022 1137  BNP 710.8* 1,021.1* 786.2*    ProBNP (last 3 results) No results for input(s): "PROBNP" in the last 8760 hours.   D-Dimer No results for input(s): "DDIMER" in the last 72 hours. Hemoglobin A1C No results for input(s): "HGBA1C" in the last 72 hours. Fasting Lipid Panel No results for input(s): "CHOL", "HDL", "LDLCALC", "TRIG", "CHOLHDL", "LDLDIRECT" in the last 72 hours. Thyroid Function Tests No results for input(s): "TSH", "T4TOTAL", "T3FREE", "THYROIDAB" in the last 72 hours.  Invalid input(s): "FREET3"  Other results:   Imaging    DG Chest 2 View  Result Date: 21-Apr-2022 CLINICAL DATA:  Pre-op for a valve replacement.  History of asthma. EXAM: CHEST - 2 VIEW COMPARISON:  Radiographs 03/07/2022 and 01/01/2022.  CT 12/02/2020. FINDINGS: Stable mild cardiac enlargement. The left  subclavian AICD leads are unchanged. Previously demonstrated pulmonary edema has resolved. Small pleural effusions and bibasilar aeration have improved. There is no pneumothorax. The bones appear unchanged. IMPRESSION: Interval resolution of pulmonary edema with improving small pleural effusions and bibasilar aeration. Electronically Signed   By: Richardean Sale M.D.   On: 21-Apr-2022 08:11   Portable Chest xray  Result Date: 04/21/22 CLINICAL DATA:  Postop mitral valve repair. EXAM: PORTABLE CHEST 1 VIEW COMPARISON:  Radiographs 04/04/2022 and 03/07/2022. FINDINGS: 0521 hours. Enteric tube has been placed, projecting below the diaphragm with the tip overlying the mid gastric body. Endotracheal tube, right IJ Swan-Ganz catheter and left subclavian AICD leads are unchanged. The heart size and mediastinal contours are stable. Interval clearing of the previously demonstrated mild pulmonary edema. No pleural effusion or pneumothorax. No acute osseous findings are evident. IMPRESSION: Interval improved aeration of the lungs following mitral valve repair. No acute cardiopulmonary process. Electronically Signed   By: Richardean Sale M.D.   On: 2022/04/21 08:09   DG Abd Portable 1V  Result Date: 04/10/2022 CLINICAL DATA:  [id number] Encounter for imaging study to confirm orogastric (OG) tube placement [id number] EXAM: PORTABLE ABDOMEN - 1 VIEW COMPARISON:  Chest x-ray '11 16 23 '$ trauma CT abdomen pelvis 03/07/2022 FINDINGS: Enteric tube courses below the hemidiaphragm with tip and side port overlying the expected region the gastric lumen. Swan-Ganz catheter partially visualized in grossly stable position. Deferred related lead in similar position. Nonobstructive bowel gas pattern. IMPRESSION: Enteric tube in good position. Electronically Signed   By: Iven Finn M.D.   On: 04/05/2022 20:56   DG CHEST PORT 1 VIEW  Result Date: 03/16/2022 CLINICAL DATA:  CHF EXAM: PORTABLE CHEST 1 VIEW COMPARISON:  03/29/2022  FINDINGS: Swan-Ganz catheter in the central right pulmonary artery. Endotracheal tube is 5 cm above the carina. Left AICD is unchanged. Bilateral perihilar and predominantly upper lobe airspace opacities. Heart is normal size. No effusions or pneumothorax. IMPRESSION: Support devices in expected position as above. Bilateral perihilar and upper lobe airspace opacities. Findings could reflect edema or infection. Electronically Signed   By: Rolm Baptise M.D.   On: 04/07/2022 19:22   CARDIAC CATHETERIZATION  Result Date: 04/02/2022 HEMODYNAMICS: On moderate doses of levophed & phenylephrine & intubated. RA:   20 mmHg with large A & V waves RV:   70/10-20 mmHg PA:   81/44 mmHg (62 mean) PCWP:  50 mmHg with V waves to 90    Estimated Fick CO/CI   3.8 L/min,  2.1 L/min/m2    TPG    12  mmHg     PVR     3.5 Wood Units PAPi      1.85 Hemodynamics after IABP at 1:1, pressors weaned off:   PA: 71/43 (54) PCWP: 50 with V waves to 70 IMPRESSION: Severely elevated pre and post capillary filling pressures. Moderately reduced cardiac index Large V waves consistent with severe mitral regurgitation Improvement in V waves by 69mHg after IABP started at 1:1 Aditya Sabharwal Advanced Heart Failure    Medications:     Scheduled Medications:  digoxin  0.0625 mg Oral Daily   fentaNYL (SUBLIMAZE) injection  25 mcg Intravenous Once   furosemide  80 mg Intravenous BID   insulin aspart  0-15 Units Subcutaneous Q4H   mouth rinse  15 mL Mouth Rinse Q2H   polyethylene glycol  17 g Per Tube Daily   sodium chloride flush  3 mL Intravenous Q12H   spironolactone  25 mg Per Tube Daily    Infusions:  sodium chloride     amiodarone 30 mg/hr (111/19/20230617)   cefTRIAXone (ROCEPHIN)  IV Stopped (03/16/2022 2102)   dexmedetomidine (PRECEDEX) IV infusion 1.2 mcg/kg/hr (119-Nov-20230617)   famotidine (PEPCID) IV Stopped (04/01/2022 2154)   fentaNYL infusion INTRAVENOUS 200 mcg/hr (1November 19, 20230617)   heparin 900 Units/hr (111/19/230617)    nitroGLYCERIN Stopped (04/02/2022 1800)   norepinephrine (LEVOPHED) Adult infusion 3 mcg/min (1November 19, 20230617)    PRN Medications: sodium chloride, acetaminophen, fentaNYL, mouth rinse, sodium chloride flush    Patient Profile   76y/o female [facility] most recent EF 15%, severe functional MR, recently declined LVAD. Presented for elective MitraClip. During induction developed atrial tachycardia in 200s, hypotension/ shock, requiring emergent cardioversion, initiation of amio gtt, NEO and NE. MitraClip aborted. AHF team consulted for shock.   Assessment/Plan   1. Acute on Chronic Systolic Heart Failure>>Shock  - h/o NICM dating back to 2012 with fluctuating EF. EF improved to 60% in 2019 but then dropped again  - Echo 6/20 EF 20%  - L/RHC 6/20 with normal coronaries and mildly elevated filling pressures.  - Underwent CRT-D upgrade 6/20 but EF has not improved - Echo 10/21 EF 25% - Echo 8/22 EF 30%, RV normal  - Echo 5/23 EF 25-30%. - Echo 02/2022 EF 20-25% [facility] Normal coronaries with anomalous RCA, Severe LV dysfunction EF 15% due to NICM, Markedly elevated filling pressures with prominent v-waves in PCWP tracing c/w known severe MR. CI 1.8. Pt decided she does not want LVAD. - Presented for MitraClip 11/16.  Developed atrial tach, hypotension/ shock during induction. Procedure aborted  - RHC hemodynamics w/ severely elevated filling pressures. IABP placed due to severity of MR. After IABP placement, pressors were quickly weaned off and there was significant improvement in V waves.  - Remains on IABP 1:1. NE 1. PAP improved but cardiac output remains low despite mechanical support  - End-stage HF. Doubt placement of MitraClip will be beneficial. Will d/w family transition to comfort care - rhythm control w/ amio gtt   - holding GDMT w/ hypotension/shock    2. Severe MR - Functional MR - TEE 11/23 showed dilated annulus.  Restricted P2. Severe central MR with several jets over P2 and P1. PISA 0.8cm. No stenosis.  - Doubt MitraClip will offer much benefit  - Continue IABP   3. Atrial Tachycardia - s/p emergent DCCV in cath lab>>NSR  -  continue amio gtt at 60 hr - keep K > 4.0 and Mg > 2.0   4. Hypoxic Respiratory Failure - remains intubated - diuresis per above  - Aspirated during code. + leucocytosis. On ceftriaxone   5. AKI - b/l SCr 1.03 - 1.22>>1.37, suspect due to hypotension/shock  - continue IABP and inotropes    Length of Stay: 1  Brittainy Simmons, PA-C  April 30, 2022, 8:22 AM  Advanced Heart Failure Team Pager [phone] (M-F; 7a - 5p)  Please contact [facility] for night-coverage after hours (5p -7a ) and weekends on [email or web address]  Agree with above.   Awake on vent. Follows commands. IABP and swan in place.  Filling pressures improved but cardiac output remains profoundly low  General:  Awake on vent HEENT: normal + ETT Neck: supple. RIJ swan  Carotids 2+ bilat; no bruits. No lymphadenopathy or thryomegaly appreciated. Cor: RRR 2/6 MR Lungs: clear Abdomen: obese soft, nontender, nondistended. No hepatosplenomegaly. No bruits or masses. Good bowel sounds. Extremities: no cyanosis, clubbing, rash, 1+ edema RFA IABP  Neuro: awake on vent  Filling pressures improved with unloading but CO is profoundly low despite support. MitraClip will not be enough to support her in the face of her end-stage/severe LV dysfunction. I think only option is to transition to comfort care. D/w family as well as CCM and Structural Team.    Will discuss timing of comfort measures this afternoon after more family arrive.   CRITICAL CARE Performed by: Glori Bickers  Total critical care time: 55 minutes  Critical care time was exclusive of separately billable procedures and treating other patients.  Critical care was necessary to treat or prevent imminent or life-threatening  deterioration.  Critical care was time spent personally by me (independent of midlevel providers or residents) on the following activities: development of treatment plan with patient and/or surrogate as well as nursing, discussions with consultants, evaluation of patient's response to treatment, examination of patient, obtaining history from patient or surrogate, ordering and performing treatments and interventions, ordering and review of laboratory studies, ordering and review of radiographic studies, pulse oximetry and re-evaluation of patient's condition.  Glori Bickers, MD  2:27 PM

## 2022-04-14 DEATH — deceased

## 2022-04-19 ENCOUNTER — Telehealth: Payer: Medicare Other

## 2022-04-27 ENCOUNTER — Telehealth: Payer: Medicare Other

## 2022-05-01 ENCOUNTER — Encounter (HOSPITAL_COMMUNITY): Payer: Medicare Other

## 2022-05-24 ENCOUNTER — Telehealth: Payer: Medicare Other

## 2022-06-16 ENCOUNTER — Other Ambulatory Visit: Payer: Medicare Other

## 2022-06-23 ENCOUNTER — Encounter: Payer: Medicare Other | Admitting: Family Medicine

## 2022-07-17 ENCOUNTER — Encounter: Payer: Medicare Other | Admitting: Internal Medicine

## 2022-07-18 ENCOUNTER — Encounter: Payer: Medicare Other | Admitting: Internal Medicine

## 2022-07-25 ENCOUNTER — Ambulatory Visit: Payer: Medicare Other | Admitting: Cardiovascular Disease

## 2022-08-31 ENCOUNTER — Encounter: Payer: Self-pay | Admitting: Pulmonary Disease

## 2022-09-21 ENCOUNTER — Encounter: Payer: Self-pay | Admitting: Pulmonary Disease

## 2023-01-20 ENCOUNTER — Encounter: Payer: Self-pay | Admitting: Pulmonary Disease

## 2023-09-01 IMAGING — CT CT ABD-PELV W/ CM
2 of 5 series · 16 of 46 positions shown, 18 images · IV contrast (APPLIED)
Comparison: CT 06/30/2021

CLINICAL DATA: Diverticulitis.  Complication suspected.

EXAM:
CT ABDOMEN AND PELVIS WITH CONTRAST
TECHNIQUE: Multidetector CT imaging of the abdomen and pelvis was performed
using the standard protocol following bolus administration of
intravenous contrast.

[Series 2: routine abd/pel with · axial · 0.81mm/px · z∈[-974,-549]mm · 13 of 97 slices shown, 15 images]
[im 6/97  soft-tissue]
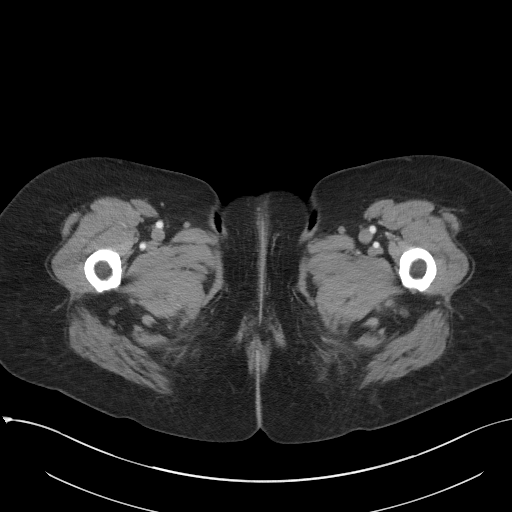
[im 6/97  bone]
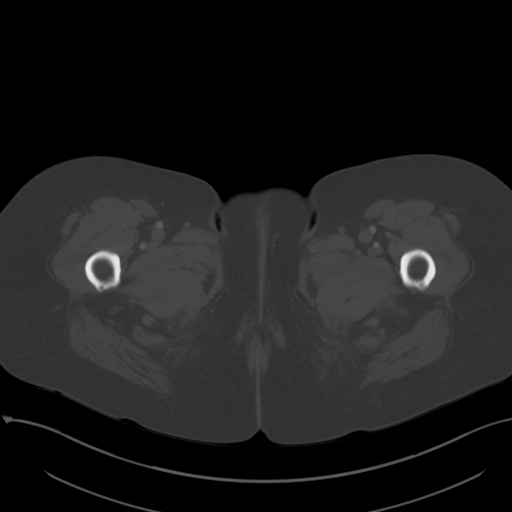
[im 11/97  soft-tissue]
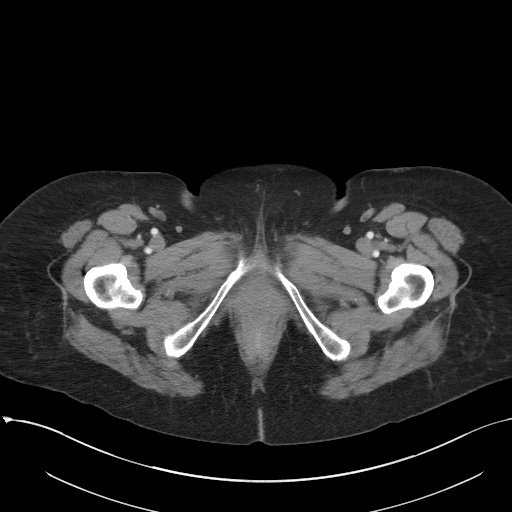
[im 22/97  soft-tissue]
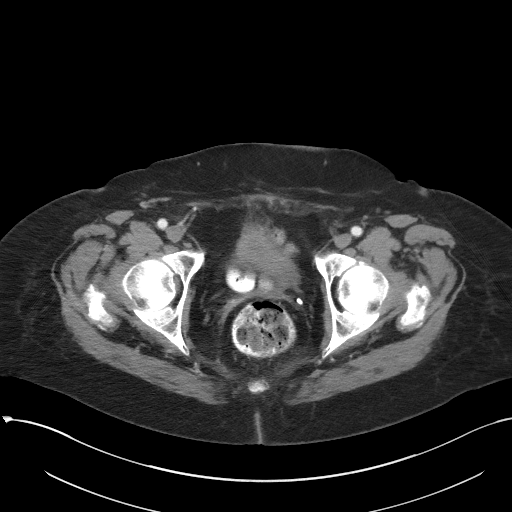
[im 27/97  soft-tissue]
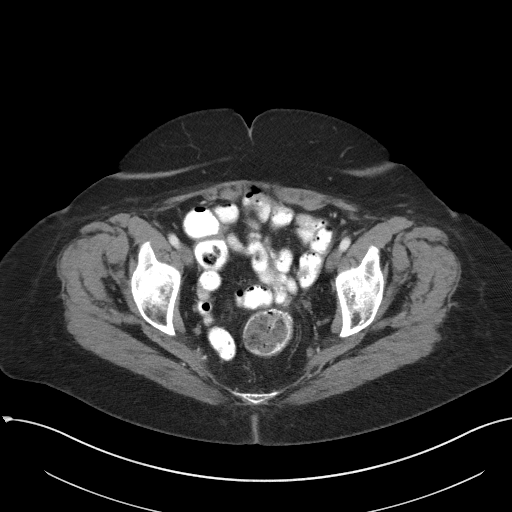
[im 33/97  soft-tissue]
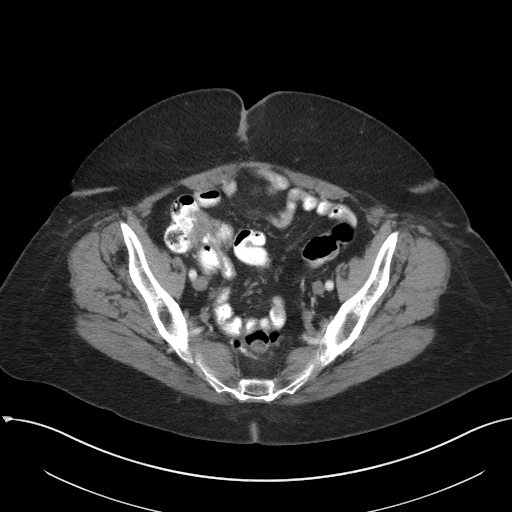
[im 43/97  soft-tissue]
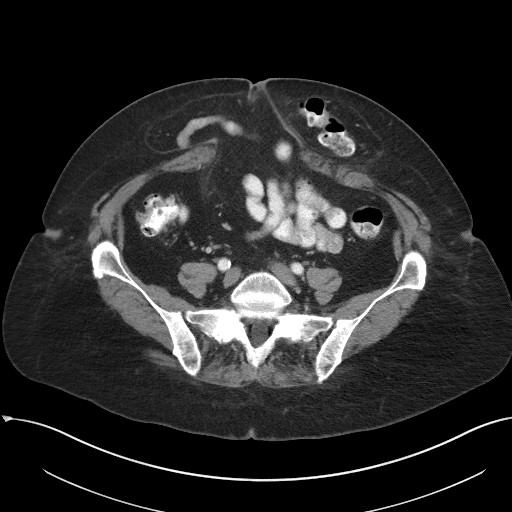
[im 49/97  soft-tissue]
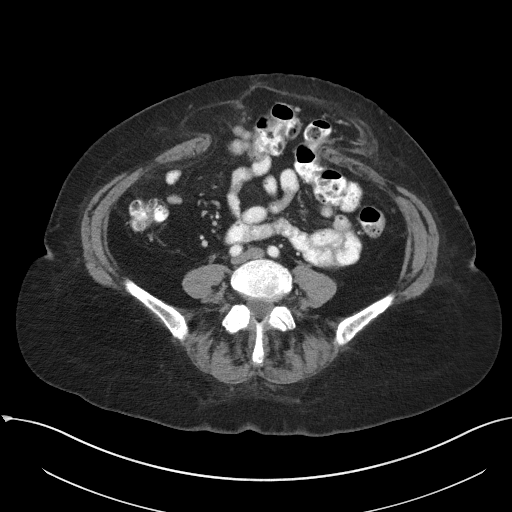
[im 54/97  soft-tissue]
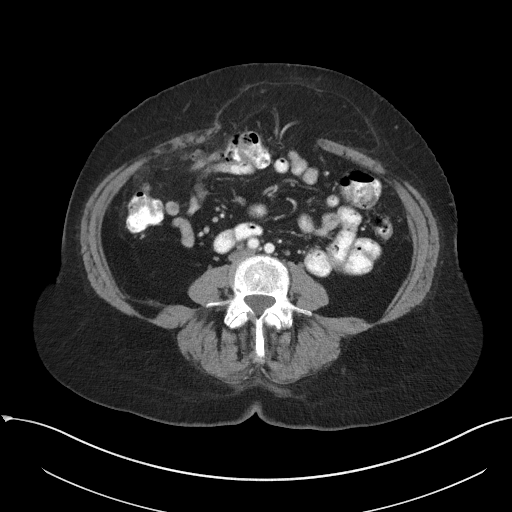
[im 65/97  soft-tissue]
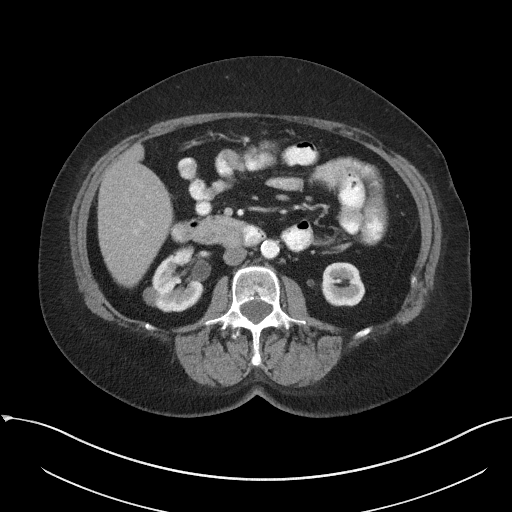
[im 65/97  bone]
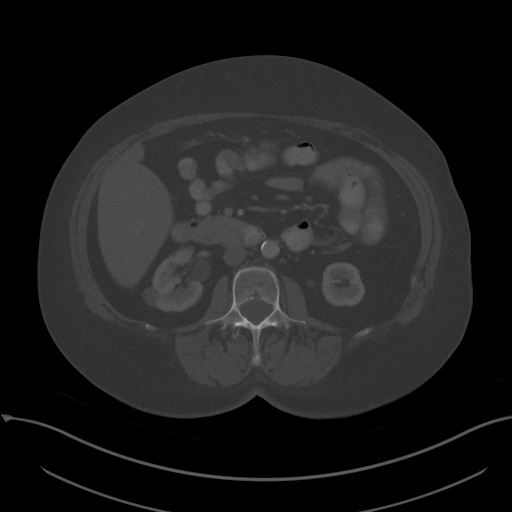
[im 70/97  soft-tissue]
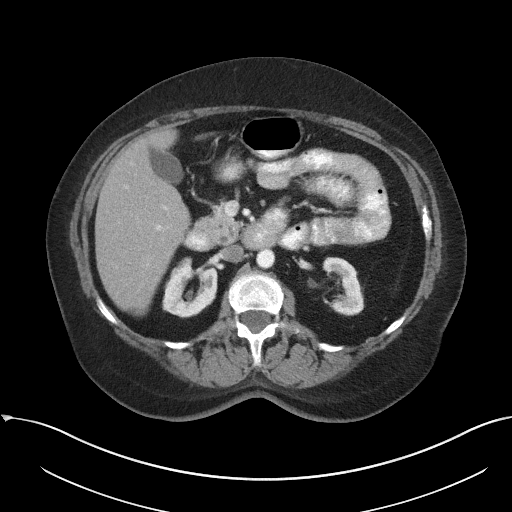
[im 75/97  soft-tissue]
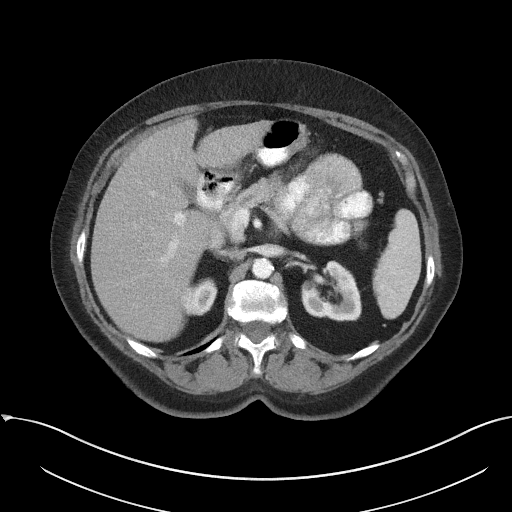
[im 86/97  soft-tissue]
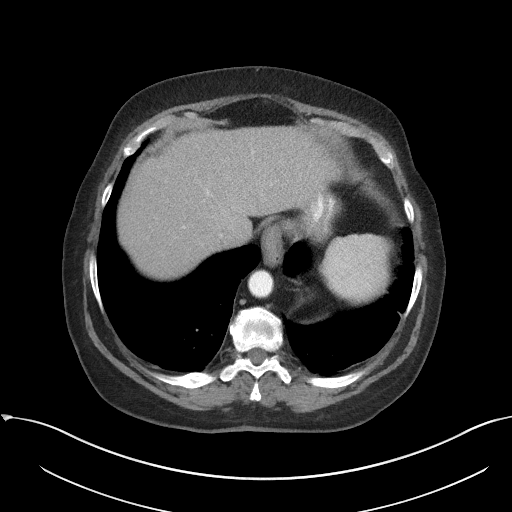
[im 91/97  soft-tissue]
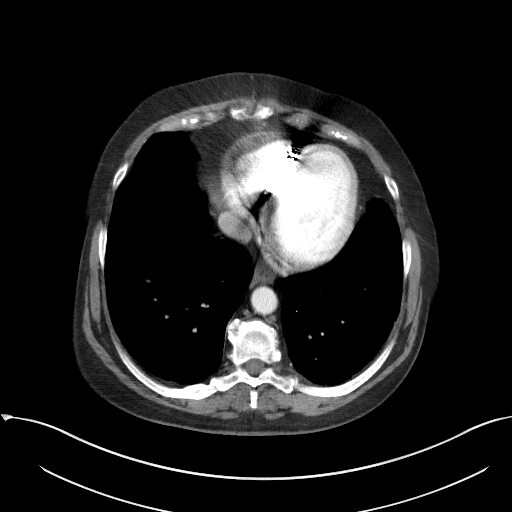

[Series 5: coronal st · coronal · 0.92mm/px · 3 of 107 slices shown]
[im 36/107  soft-tissue]
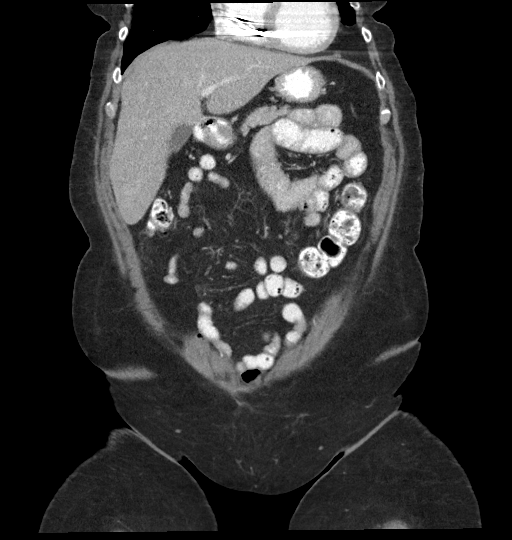
[im 48/107  soft-tissue]
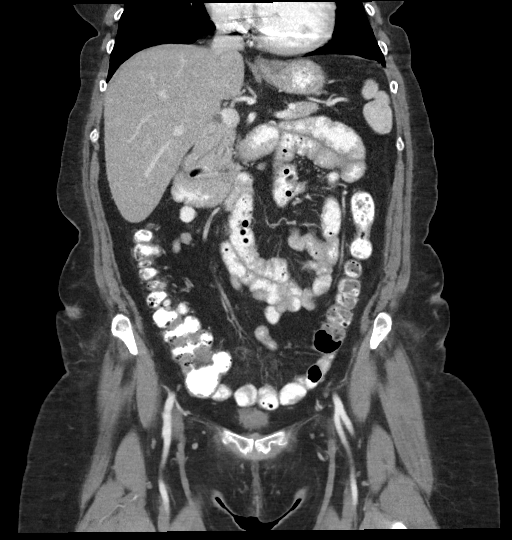
[im 59/107  soft-tissue]
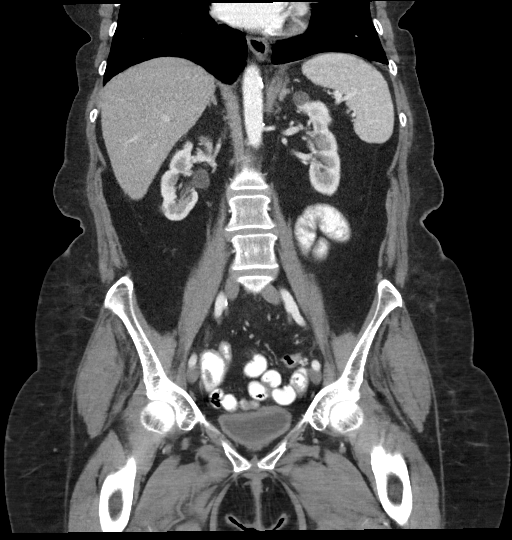

[16 of 46 positions shown; findings below may reference images not displayed]

RADIATION DOSE REDUCTION: This exam was performed according to the
departmental dose-optimization program which includes automated
exposure control, adjustment of the mA and/or kV according to
patient size and/or use of iterative reconstruction technique.

CONTRAST:  100mL OMNIPAQUE IOHEXOL 300 MG/ML  SOLN
FINDINGS: Lower chest: Lung bases are clear.

Hepatobiliary: The several hypodense lesions in the liver unchanged
from prior. Normal gallbladder

Pancreas: Pancreas is normal. No ductal dilatation. No pancreatic
inflammation.

Spleen: Normal spleen

Adrenals/urinary tract: Adrenal glands normal. No acute renal
findings. Bilateral benign renal cysts again noted. One exophytic
lesion from the mid RIGHT kidney has intermediate density an d
measures 15 mm image 34/2. Ureters bladder normal.

Stomach/Bowel: Stomach duodenum normal. Small bowel is normal. Small
bowel does enter and exit the large ventral hernia. No ventral
hernias wide-mouth.

Terminal ileum is normal. Appendix not identified. Ascending
transverse colon normal. Mid transverse colon also enters the
midline ventral hernia in exist without obstruction

Interval resolution previous seen diverticulitis localized to the
splenic flexure.

No acute inflammation identified in the descending colon. There is
anastomosis of the proximal sigmoid colon. Rectum normal.

Vascular/Lymphatic: Abdominal aorta is normal caliber. No periportal
or retroperitoneal adenopathy. No pelvic adenopathy.

Reproductive: Post hysterectomy.  Adnexa unremarkable

Other: No free fluid.

Musculoskeletal: No aggressive osseous lesion.
IMPRESSION: 1. Interval resolution of LEFT colon diverticulitis.
2. No inflammation or infection identified within the small bowel or
colon.
3. Large wide-mouth ventral hernia which contains nonobstructed
loops of large bowel and small bowel.
4. Anastomosis in the sigmoid colon without complication.
5. Indeterminate exophytic lesion in RIGHT kidney. Recommend MRI
with without contrast for further evaluation
